# Patient Record
Sex: Male | Born: 1960 | Race: White | Hispanic: No | Smoking: Current every day smoker
Health system: Southern US, Community
[De-identification: ages and names within clinical notes are randomized; demographics above are authoritative.]

## PROBLEM LIST (undated history)

## (undated) ENCOUNTER — Ambulatory Visit

## (undated) ENCOUNTER — Encounter

## (undated) ENCOUNTER — Encounter: Attending: Surgery | Primary: Surgery

## (undated) ENCOUNTER — Ambulatory Visit
Attending: Pharmacist Clinician (PhC)/ Clinical Pharmacy Specialist | Primary: Pharmacist Clinician (PhC)/ Clinical Pharmacy Specialist

## (undated) ENCOUNTER — Encounter: Payer: MEDICAID | Attending: Family | Primary: Family

## (undated) ENCOUNTER — Ambulatory Visit: Attending: Internal Medicine | Primary: Internal Medicine

## (undated) ENCOUNTER — Telehealth

## (undated) ENCOUNTER — Telehealth: Attending: Surgery | Primary: Surgery

## (undated) ENCOUNTER — Telehealth: Attending: Family | Primary: Family

## (undated) ENCOUNTER — Ambulatory Visit: Payer: MEDICAID | Attending: Family | Primary: Family

## (undated) ENCOUNTER — Encounter
Attending: Student in an Organized Health Care Education/Training Program | Primary: Student in an Organized Health Care Education/Training Program

## (undated) ENCOUNTER — Encounter: Attending: Family | Primary: Family

## (undated) ENCOUNTER — Ambulatory Visit: Payer: Medicaid (Managed Care) | Attending: Surgery | Primary: Surgery

## (undated) ENCOUNTER — Ambulatory Visit: Payer: MEDICAID

## (undated) ENCOUNTER — Ambulatory Visit: Attending: Family Medicine | Primary: Family Medicine

## (undated) ENCOUNTER — Telehealth
Attending: Pharmacist Clinician (PhC)/ Clinical Pharmacy Specialist | Primary: Pharmacist Clinician (PhC)/ Clinical Pharmacy Specialist

## (undated) ENCOUNTER — Encounter
Attending: Pharmacist Clinician (PhC)/ Clinical Pharmacy Specialist | Primary: Pharmacist Clinician (PhC)/ Clinical Pharmacy Specialist

## (undated) ENCOUNTER — Telehealth: Attending: Radiation Oncology | Primary: Radiation Oncology

## (undated) ENCOUNTER — Telehealth: Attending: Family Medicine | Primary: Family Medicine

## (undated) ENCOUNTER — Encounter: Attending: Hematology & Oncology | Primary: Hematology & Oncology

## (undated) ENCOUNTER — Ambulatory Visit: Payer: MEDICAID | Attending: Clinical | Primary: Clinical

## (undated) DIAGNOSIS — I2510 Atherosclerotic heart disease of native coronary artery without angina pectoris: Secondary | ICD-10-CM

## (undated) DIAGNOSIS — J449 Chronic obstructive pulmonary disease, unspecified: Secondary | ICD-10-CM

## (undated) DIAGNOSIS — C801 Malignant (primary) neoplasm, unspecified: Secondary | ICD-10-CM

## (undated) DIAGNOSIS — F329 Major depressive disorder, single episode, unspecified: Secondary | ICD-10-CM

## (undated) DIAGNOSIS — F32A Depression, unspecified: Secondary | ICD-10-CM

## (undated) DIAGNOSIS — B182 Chronic viral hepatitis C: Secondary | ICD-10-CM

## (undated) DIAGNOSIS — F319 Bipolar disorder, unspecified: Secondary | ICD-10-CM

## (undated) DIAGNOSIS — K2270 Barrett's esophagus without dysplasia: Secondary | ICD-10-CM

## (undated) DIAGNOSIS — F419 Anxiety disorder, unspecified: Secondary | ICD-10-CM

## (undated) DIAGNOSIS — I639 Cerebral infarction, unspecified: Secondary | ICD-10-CM

## (undated) DIAGNOSIS — D72829 Elevated white blood cell count, unspecified: Secondary | ICD-10-CM

## (undated) DIAGNOSIS — K219 Gastro-esophageal reflux disease without esophagitis: Secondary | ICD-10-CM

## (undated) DIAGNOSIS — E785 Hyperlipidemia, unspecified: Secondary | ICD-10-CM

## (undated) DIAGNOSIS — K759 Inflammatory liver disease, unspecified: Secondary | ICD-10-CM

## (undated) DIAGNOSIS — E119 Type 2 diabetes mellitus without complications: Secondary | ICD-10-CM

## (undated) DIAGNOSIS — R7303 Prediabetes: Secondary | ICD-10-CM

## (undated) DIAGNOSIS — I10 Essential (primary) hypertension: Secondary | ICD-10-CM

## (undated) HISTORY — PX: CORONARY ANGIOPLASTY: SHX604

## (undated) HISTORY — PX: WRIST SURGERY: SHX841

## (undated) HISTORY — PX: ANGIOPLASTY: SHX39

## (undated) HISTORY — PX: CARDIAC CATHETERIZATION: SHX172

## (undated) HISTORY — PX: APPENDECTOMY: SHX54

## (undated) HISTORY — PX: CHOLECYSTECTOMY: SHX55

---

## 2003-06-25 ENCOUNTER — Encounter: Payer: Self-pay | Admitting: Emergency Medicine

## 2003-06-25 ENCOUNTER — Emergency Department (HOSPITAL_COMMUNITY): Admission: EM | Admit: 2003-06-25 | Discharge: 2003-06-25 | Payer: Self-pay | Admitting: Emergency Medicine

## 2007-05-16 ENCOUNTER — Other Ambulatory Visit: Payer: Self-pay

## 2007-05-16 ENCOUNTER — Emergency Department: Payer: Self-pay | Admitting: Unknown Physician Specialty

## 2009-11-07 ENCOUNTER — Emergency Department: Payer: Self-pay | Admitting: Emergency Medicine

## 2010-03-04 ENCOUNTER — Emergency Department: Payer: Self-pay | Admitting: Emergency Medicine

## 2010-11-26 ENCOUNTER — Observation Stay: Payer: Self-pay | Admitting: Internal Medicine

## 2011-04-29 ENCOUNTER — Emergency Department: Payer: Self-pay | Admitting: Emergency Medicine

## 2011-06-10 ENCOUNTER — Emergency Department: Payer: Self-pay | Admitting: Emergency Medicine

## 2011-07-24 ENCOUNTER — Inpatient Hospital Stay: Payer: Self-pay | Admitting: Surgery

## 2011-09-23 IMAGING — CR DG CHEST 2V
1 series · 2 of 2 positions shown · non-contrast
Comparison: none

REASON FOR EXAM: chest pain
COMMENTS:

PROCEDURE:     DXR - DXR CHEST PA (OR AP) AND LATERAL  - November 26, 2010  [DATE]
RESULT:     The lung fields are clear. The heart, mediastinal and osseous
structures reveal no significant abnormalities.

[Series 1: view not recorded · 0.17mm/px · 2 of 2 slices shown]
[im 1/2]
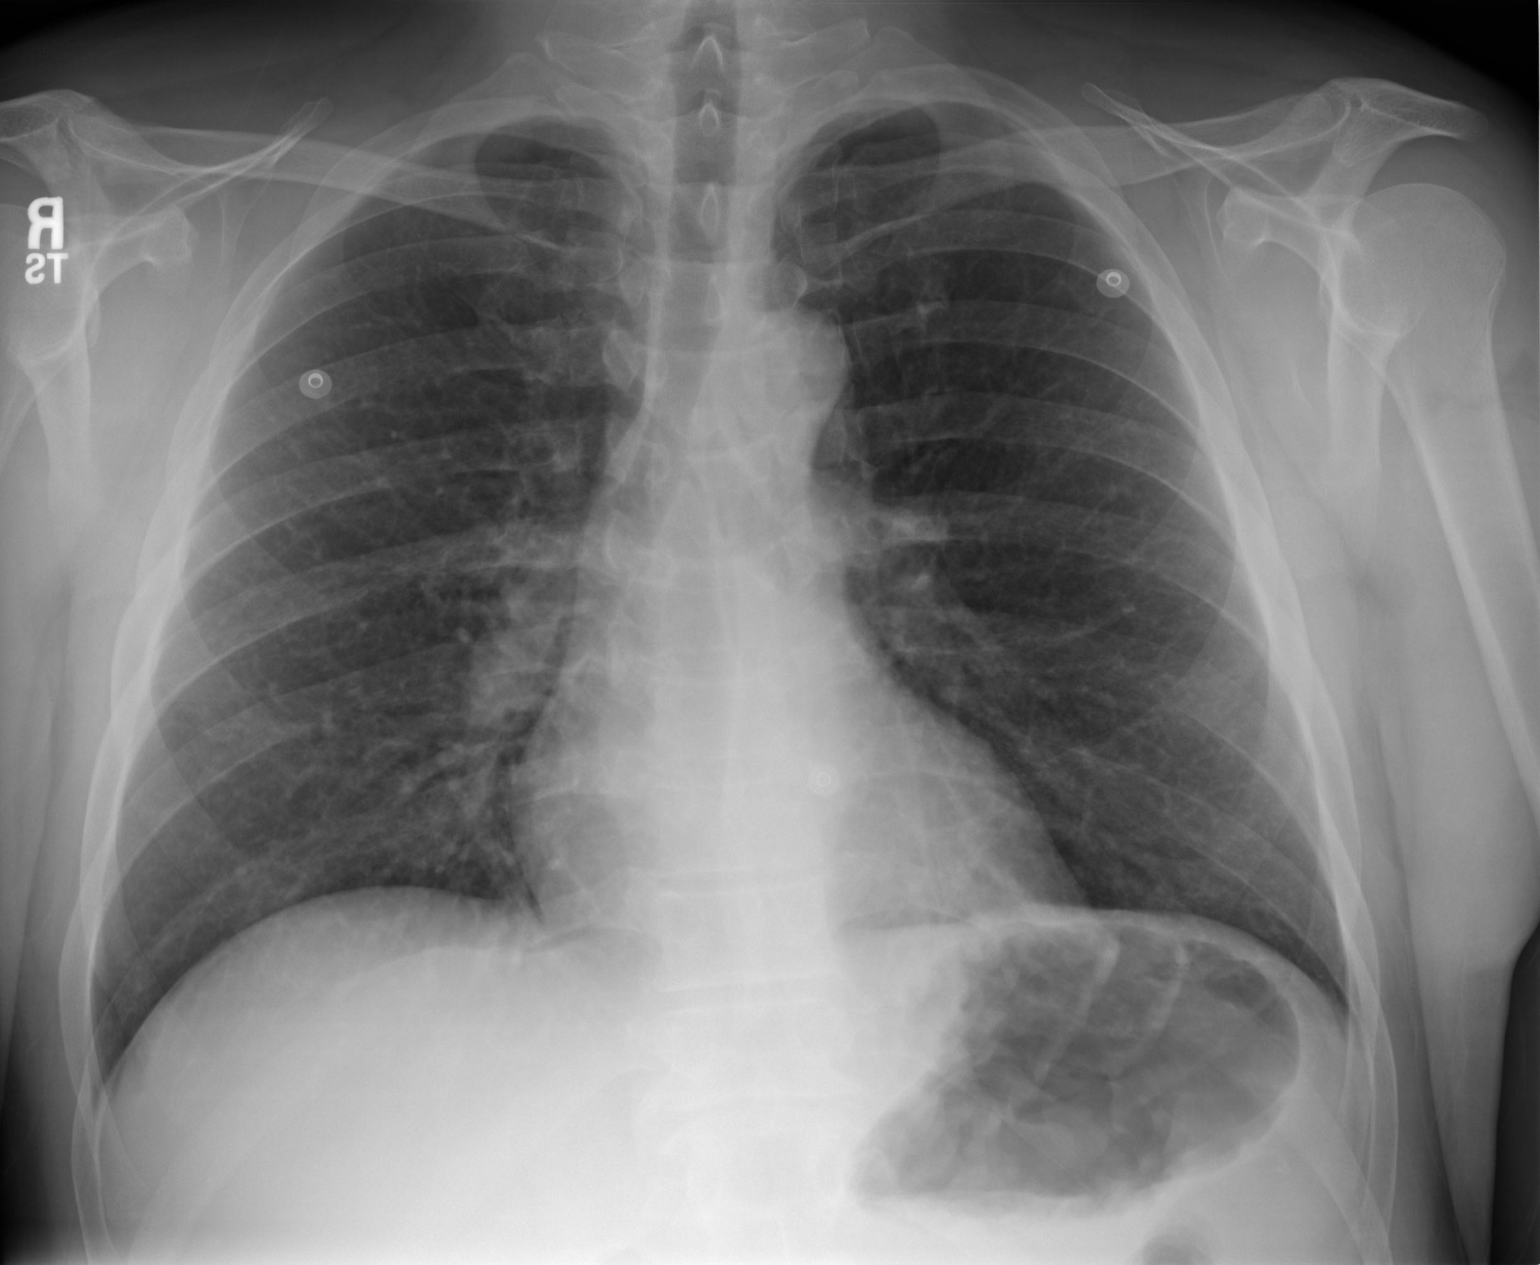
[im 2/2]
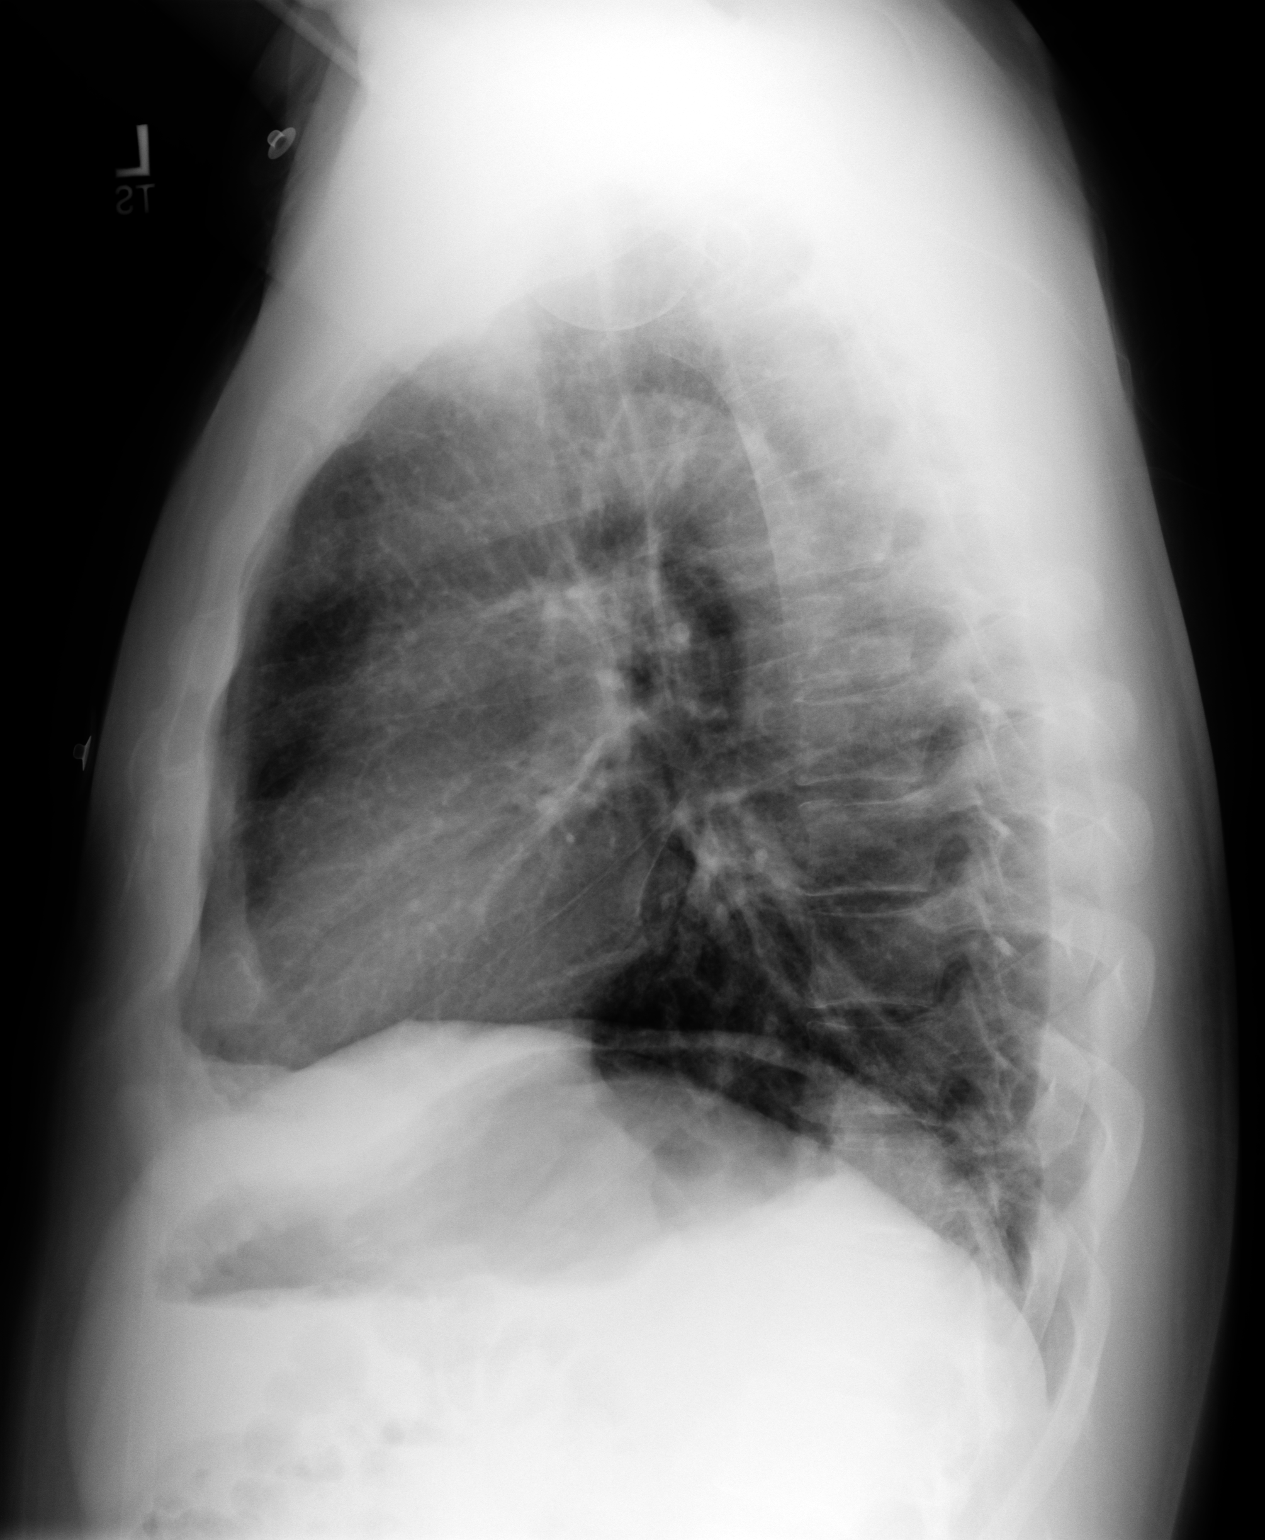

[2 of 2 positions shown; findings below may reference images not displayed]

IMPRESSION: 1.     No significant abnormalities are noted.

## 2011-09-24 ENCOUNTER — Inpatient Hospital Stay: Payer: Self-pay | Admitting: Internal Medicine

## 2011-10-01 LAB — PATHOLOGY REPORT

## 2012-02-04 ENCOUNTER — Inpatient Hospital Stay: Payer: Self-pay | Admitting: Internal Medicine

## 2012-02-04 LAB — CBC
HCT: 50.8 % (ref 40.0–52.0)
MCH: 31.2 pg (ref 26.0–34.0)
MCHC: 33.3 g/dL (ref 32.0–36.0)
RBC: 5.43 10*6/uL (ref 4.40–5.90)
RDW: 13.1 % (ref 11.5–14.5)

## 2012-02-04 LAB — COMPREHENSIVE METABOLIC PANEL
Albumin: 3.6 g/dL (ref 3.4–5.0)
Anion Gap: 7 (ref 7–16)
Calcium, Total: 9.1 mg/dL (ref 8.5–10.1)
Chloride: 106 mmol/L (ref 98–107)
EGFR (African American): >60
EGFR (Non-African Amer.): >60
Glucose: 96 mg/dL (ref 65–99)
Potassium: 4.6 mmol/L (ref 3.5–5.1)
SGOT(AST): 52 U/L — ABNORMAL HIGH (ref 15–37)
SGPT (ALT): 81 U/L — ABNORMAL HIGH
Sodium: 139 mmol/L (ref 136–145)

## 2012-02-04 LAB — PROTIME-INR: INR: 0.9

## 2012-02-04 LAB — CK TOTAL AND CKMB (NOT AT ARMC): CK-MB: 1.4 ng/mL (ref 0.5–3.6)

## 2012-02-05 LAB — HEPATIC FUNCTION PANEL A (ARMC)
Albumin: 3.6 g/dL (ref 3.4–5.0)
Alkaline Phosphatase: 61 U/L (ref 50–136)
SGOT(AST): 54 U/L — ABNORMAL HIGH (ref 15–37)
SGPT (ALT): 86 U/L — ABNORMAL HIGH

## 2012-02-05 LAB — TROPONIN I
Troponin-I: <0.02 ng/mL
Troponin-I: <0.02 ng/mL

## 2012-02-19 ENCOUNTER — Observation Stay: Payer: Self-pay | Admitting: Internal Medicine

## 2012-02-19 LAB — COMPREHENSIVE METABOLIC PANEL
Alkaline Phosphatase: 72 U/L (ref 50–136)
Anion Gap: 17 — ABNORMAL HIGH (ref 7–16)
BUN: 13 mg/dL (ref 7–18)
Bilirubin,Total: 0.4 mg/dL (ref 0.2–1.0)
Chloride: 107 mmol/L (ref 98–107)
Co2: 21 mmol/L (ref 21–32)
Creatinine: 0.86 mg/dL (ref 0.60–1.30)
EGFR (Non-African Amer.): >60
Glucose: 103 mg/dL — ABNORMAL HIGH (ref 65–99)
Osmolality: 289 (ref 275–301)
Potassium: 4.1 mmol/L (ref 3.5–5.1)
Sodium: 145 mmol/L (ref 136–145)

## 2012-02-19 LAB — CK TOTAL AND CKMB (NOT AT ARMC): CK-MB: 2.6 ng/mL (ref 0.5–3.6)

## 2012-02-19 LAB — CBC WITH DIFFERENTIAL/PLATELET
Basophil #: 0 10*3/uL (ref 0.0–0.1)
Eosinophil #: 0.4 10*3/uL (ref 0.0–0.7)
HCT: 50.7 % (ref 40.0–52.0)
Lymphocyte #: 5.5 10*3/uL — ABNORMAL HIGH (ref 1.0–3.6)
MCHC: 34 g/dL (ref 32.0–36.0)
MCV: 93 fL (ref 80–100)
Monocyte #: 1.1 10*3/uL — ABNORMAL HIGH (ref 0.0–0.7)
Monocyte %: 7.5 %
Neutrophil #: 7.6 10*3/uL — ABNORMAL HIGH (ref 1.4–6.5)
Neutrophil %: 51.8 %
RDW: 12.7 % (ref 11.5–14.5)
WBC: 14.6 10*3/uL — ABNORMAL HIGH (ref 3.8–10.6)

## 2012-02-19 LAB — TROPONIN I
Troponin-I: <0.02 ng/mL
Troponin-I: <0.02 ng/mL

## 2012-02-19 LAB — ETHANOL: Ethanol: 149 mg/dL

## 2012-02-19 LAB — URINALYSIS, COMPLETE
Bacteria: NONE SEEN
Blood: NEGATIVE
Hyaline Cast: 3
Ketone: NEGATIVE
Protein: NEGATIVE
RBC,UR: <1 /HPF (ref 0–5)
Squamous Epithelial: NONE SEEN

## 2012-02-19 LAB — PROTIME-INR
INR: 0.9
Prothrombin Time: 13 secs (ref 11.5–14.7)

## 2012-02-19 LAB — CK-MB: CK-MB: 1.6 ng/mL (ref 0.5–3.6)

## 2012-02-20 DIAGNOSIS — R9431 Abnormal electrocardiogram [ECG] [EKG]: Secondary | ICD-10-CM

## 2012-02-20 LAB — COMPREHENSIVE METABOLIC PANEL
Albumin: 3.3 g/dL — ABNORMAL LOW (ref 3.4–5.0)
Anion Gap: 7 (ref 7–16)
BUN: 18 mg/dL (ref 7–18)
Bilirubin,Total: 0.6 mg/dL (ref 0.2–1.0)
Creatinine: 0.93 mg/dL (ref 0.60–1.30)
EGFR (African American): >60
Glucose: 110 mg/dL — ABNORMAL HIGH (ref 65–99)
Potassium: 4.1 mmol/L (ref 3.5–5.1)
SGPT (ALT): 91 U/L — ABNORMAL HIGH
Sodium: 139 mmol/L (ref 136–145)
Total Protein: 6.3 g/dL — ABNORMAL LOW (ref 6.4–8.2)

## 2012-02-20 LAB — CBC WITH DIFFERENTIAL/PLATELET
Basophil %: 0.5 %
Eosinophil %: 6.9 %
HCT: 45.3 % (ref 40.0–52.0)
HGB: 15.5 g/dL (ref 13.0–18.0)
Lymphocyte #: 3.9 10*3/uL — ABNORMAL HIGH (ref 1.0–3.6)
Monocyte #: 0.6 10*3/uL (ref 0.0–0.7)
Monocyte %: 7.1 %
Neutrophil #: 3.9 10*3/uL (ref 1.4–6.5)
RBC: 4.94 10*6/uL (ref 4.40–5.90)

## 2012-02-20 LAB — TROPONIN I: Troponin-I: <0.02 ng/mL

## 2012-04-06 IMAGING — CR DG CHEST 1V PORT
1 series · 1 of 1 positions shown · non-contrast
Comparison: none

REASON FOR EXAM: upper abd pain
COMMENTS:

PROCEDURE:     DXR - DXR PORTABLE CHEST SINGLE VIEW  - June 10, 2011  [DATE]
RESULT:     Comparison is made to the prior exam of 11/26/2010.
The lung fields are clear. The heart, mediastinal and osseous structures
show no significant abnormalities.

[view not recorded]
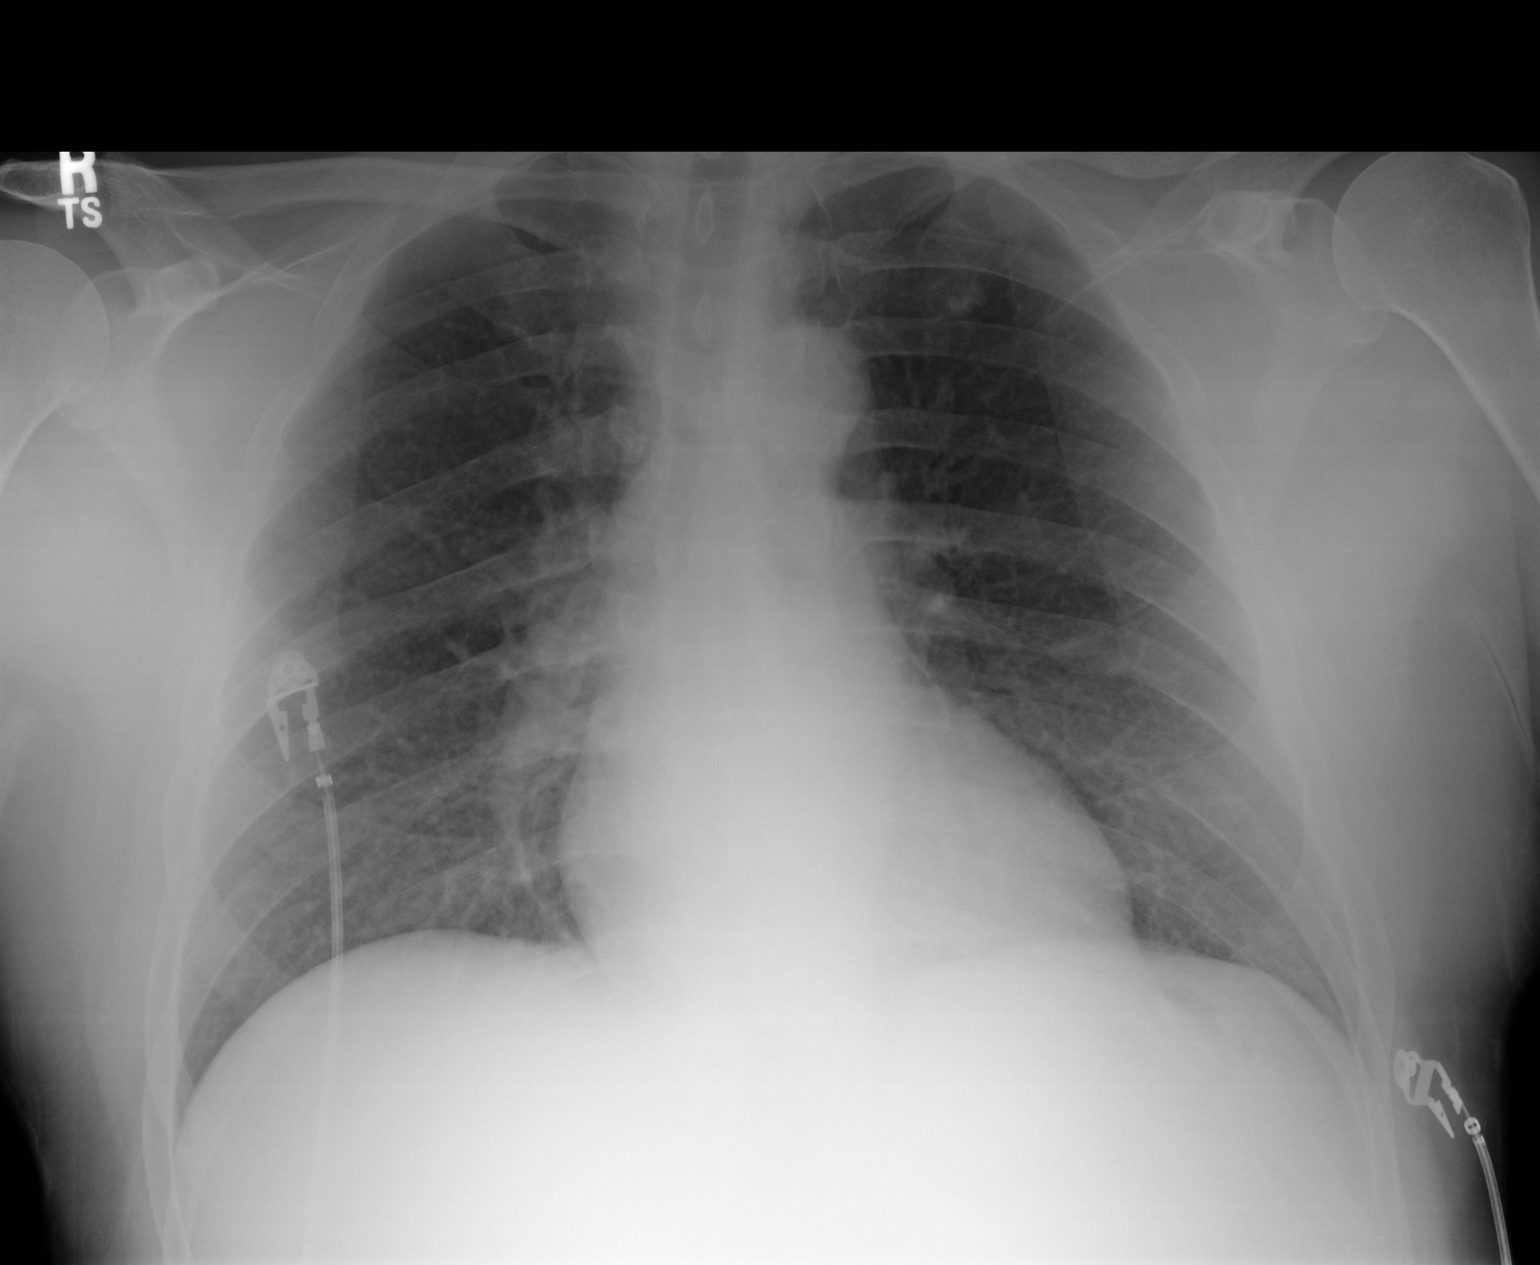

[1 of 1 positions shown; findings below may reference images not displayed]

IMPRESSION: No acute changes are identified.

## 2012-04-06 IMAGING — US ABDOMEN ULTRASOUND
1 series · 17 of 25 positions shown · non-contrast
Comparison: none

REASON FOR EXAM: RUQ pain
COMMENTS:   May transport without cardiac monitor

PROCEDURE:     US  - US ABDOMEN GENERAL SURVEY  - June 10, 2011 [DATE]
RESULT:     Comparison: None
TECHNIQUE: Multiple gray-scale and color-flow Doppler images of the abdomen
are presented for review.

[Series 1: abdomen ultrasound · 17 of 119 slices shown]
[im 1/119]
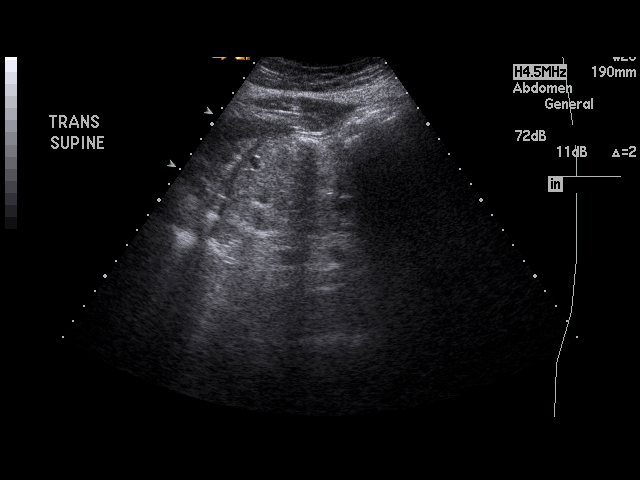
[im 10/119]
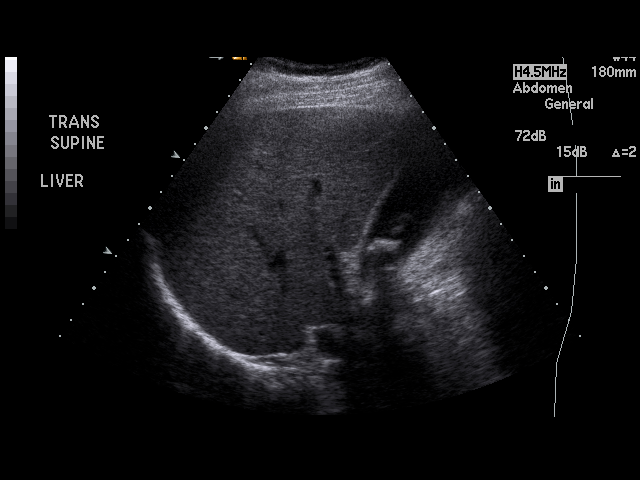
[im 15/119]
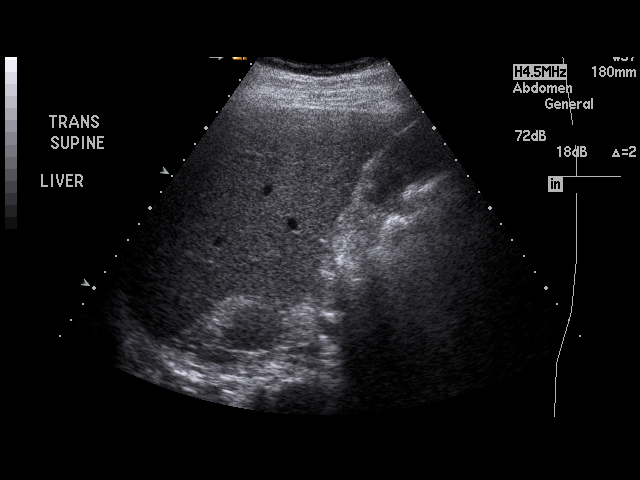
[im 25/119]
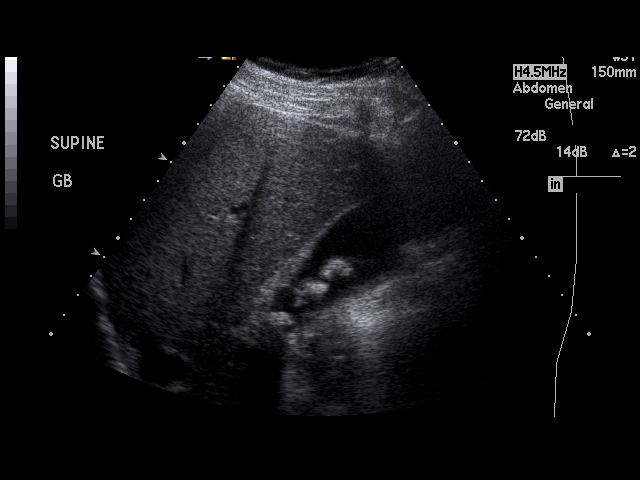
[im 30/119]
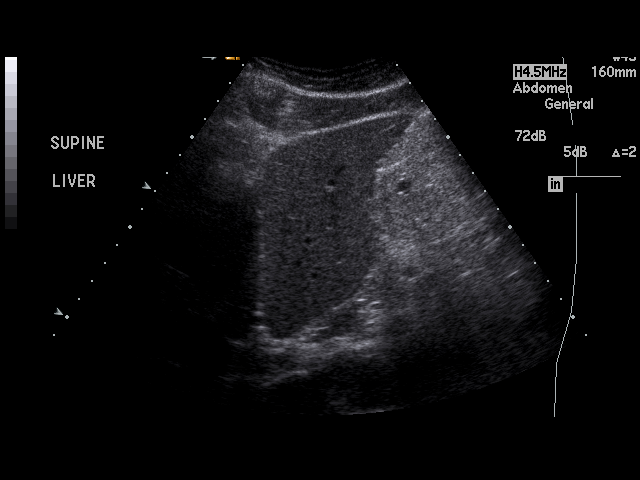
[im 40/119]
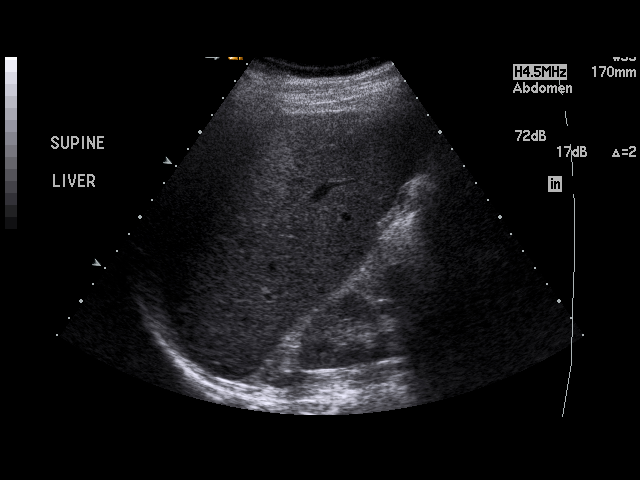
[im 45/119]
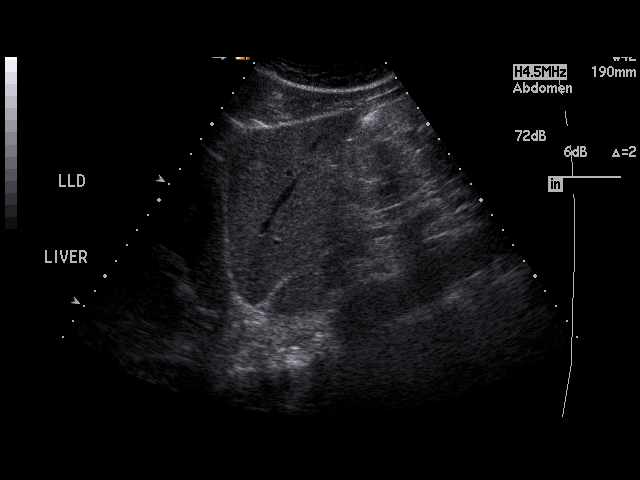
[im 55/119]
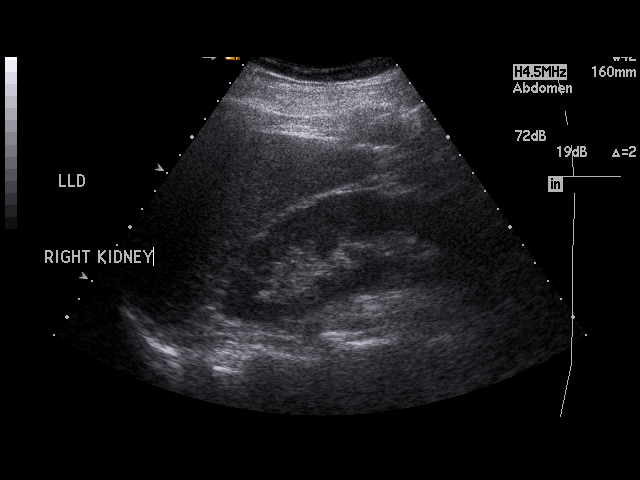
[im 60/119]
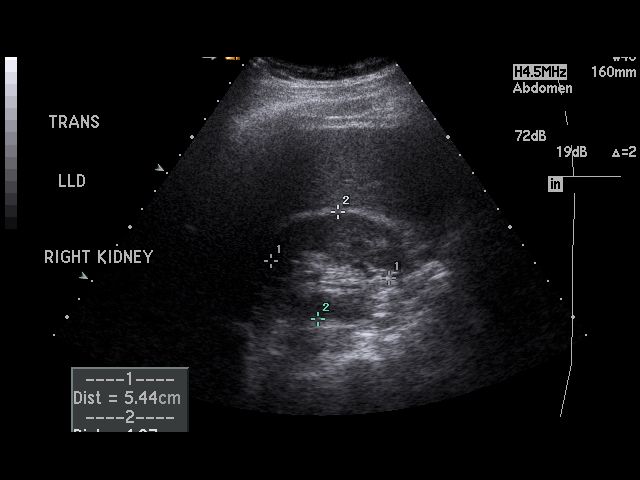
[im 64/119]
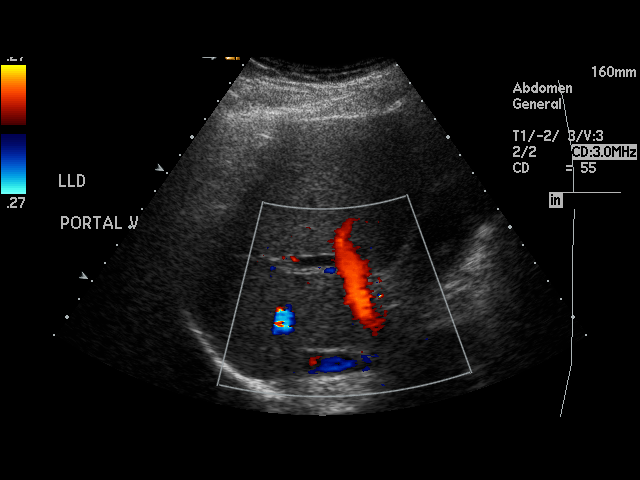
[im 74/119]
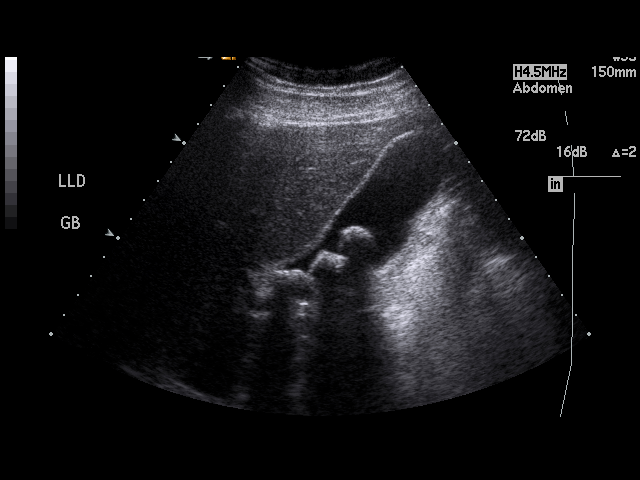
[im 79/119]
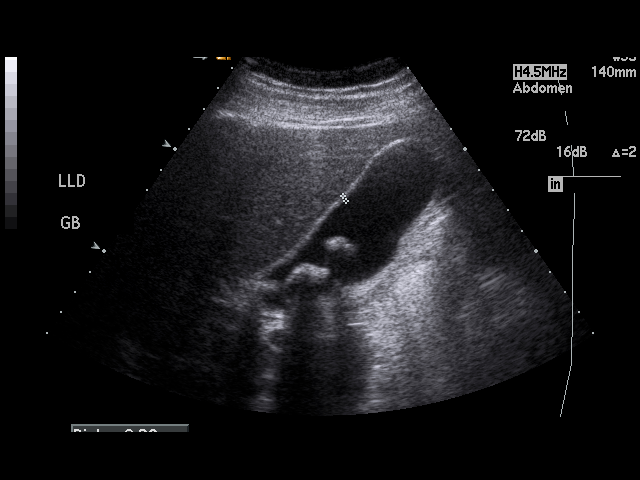
[im 89/119]
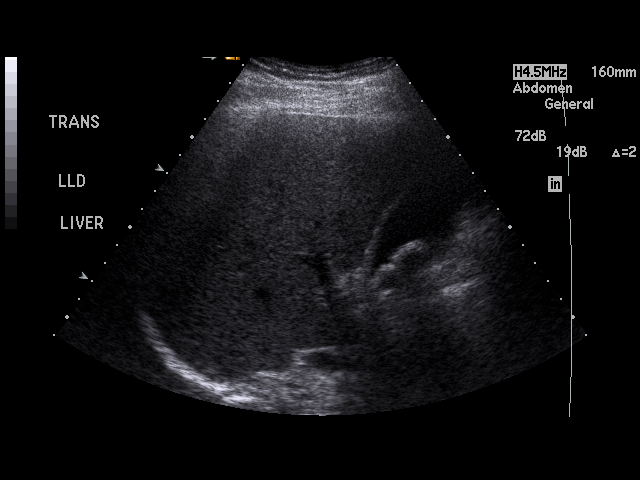
[im 94/119]
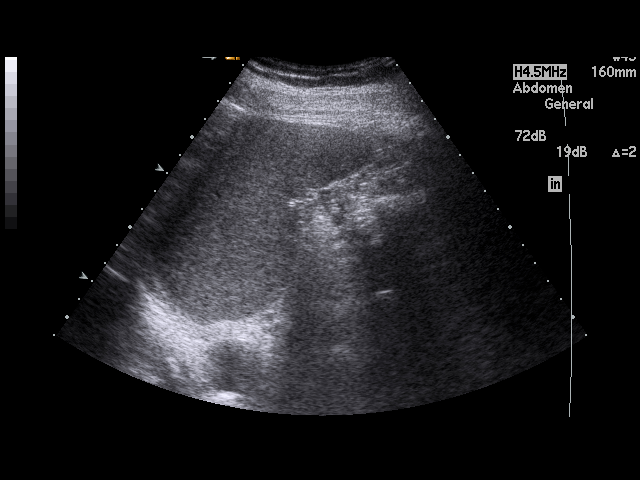
[im 104/119]
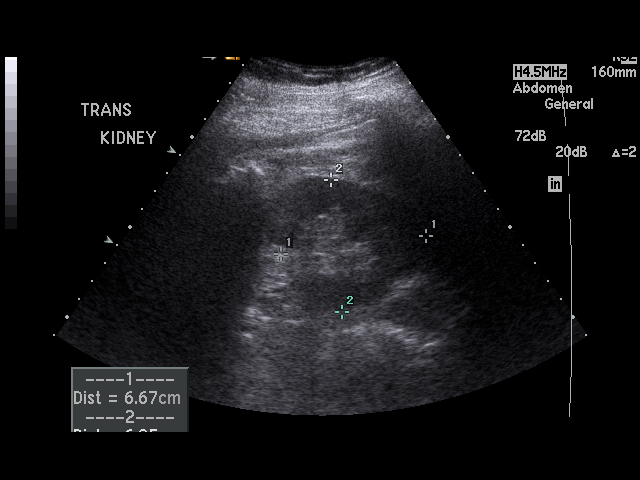
[im 109/119]
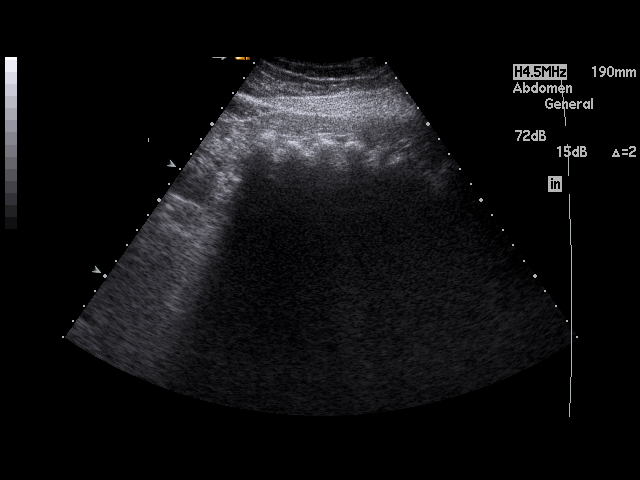
[im 119/119]
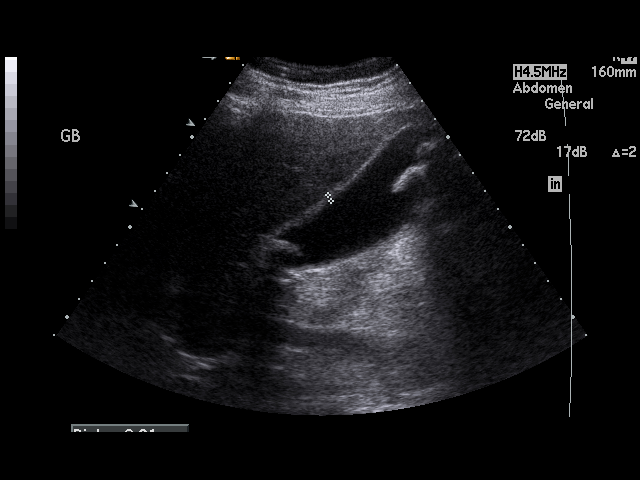

[17 of 25 positions shown; findings below may reference images not displayed]

FINDINGS: Visualized portions of the liver demonstrate normal echogenicity and normal
contours. The liver is without evidence of a focal hepatic lesion.

There are cholelithiasis with a gallstone impacted in the gallbladder neck.
There is no intra- or extrahepatic biliary ductal dilatation. The common
duct measures 4.6 mm in maximal diameter. There is a positive sonographic
Murphy sign. There is no pericholecystic fluid. The gallbladder wall is top
normal in thickness measuring 3.1 mm.

The pancreas is not visualized. The spleen is unremarkable. Bilateral
kidneys are normal in echogenicity and size. The right kidney measures
x 4.7 x 5.4 cm. The left kidney measures 12.9 x 6.4 x 6.7 cm. There are no
renal calculi or hydronephrosis. The abdominal aorta and IVC are
unremarkable.
IMPRESSION: Cholelithiasis with an impacted gallstone in the gallbladder neck. There are
findings concerning for, but not diagnostic of acute cholecystitis.

## 2012-04-19 ENCOUNTER — Observation Stay: Payer: Self-pay | Admitting: Specialist

## 2012-04-19 LAB — CK TOTAL AND CKMB (NOT AT ARMC)
CK, Total: 31 U/L — ABNORMAL LOW (ref 35–232)
CK, Total: 31 U/L — ABNORMAL LOW (ref 35–232)
CK-MB: <0.5 ng/mL — ABNORMAL LOW (ref 0.5–3.6)

## 2012-04-19 LAB — COMPREHENSIVE METABOLIC PANEL
Alkaline Phosphatase: 74 U/L (ref 50–136)
Anion Gap: 4 — ABNORMAL LOW (ref 7–16)
BUN: 14 mg/dL (ref 7–18)
Creatinine: 0.92 mg/dL (ref 0.60–1.30)
EGFR (African American): >60
EGFR (Non-African Amer.): >60
Glucose: 87 mg/dL (ref 65–99)
Osmolality: 276 (ref 275–301)
Potassium: 4.2 mmol/L (ref 3.5–5.1)
SGPT (ALT): 61 U/L

## 2012-04-19 LAB — CBC
HGB: 17.5 g/dL (ref 13.0–18.0)
MCHC: 33.3 g/dL (ref 32.0–36.0)
MCV: 94 fL (ref 80–100)

## 2012-04-19 LAB — ETHANOL: Ethanol: <3 mg/dL

## 2012-04-20 LAB — BASIC METABOLIC PANEL
Calcium, Total: 8.5 mg/dL (ref 8.5–10.1)
Co2: 27 mmol/L (ref 21–32)
Creatinine: 0.77 mg/dL (ref 0.60–1.30)
EGFR (African American): >60
EGFR (Non-African Amer.): >60
Glucose: 97 mg/dL (ref 65–99)
Osmolality: 276 (ref 275–301)
Potassium: 4 mmol/L (ref 3.5–5.1)
Sodium: 138 mmol/L (ref 136–145)

## 2012-04-20 LAB — CBC WITH DIFFERENTIAL/PLATELET
Basophil %: 0.5 %
Eosinophil #: 0.4 10*3/uL (ref 0.0–0.7)
Eosinophil %: 4.9 %
HCT: 49.7 % (ref 40.0–52.0)
HGB: 16.7 g/dL (ref 13.0–18.0)
Lymphocyte #: 2.8 10*3/uL (ref 1.0–3.6)
Lymphocyte %: 33.3 %
MCH: 31.4 pg (ref 26.0–34.0)
Monocyte #: 0.8 x10 3/mm (ref 0.2–1.0)
Monocyte %: 9.8 %
Neutrophil #: 4.4 10*3/uL (ref 1.4–6.5)
Platelet: 197 10*3/uL (ref 150–440)
RBC: 5.33 10*6/uL (ref 4.40–5.90)
RDW: 13.1 % (ref 11.5–14.5)
WBC: 8.5 10*3/uL (ref 3.8–10.6)

## 2012-05-04 ENCOUNTER — Emergency Department: Payer: Self-pay | Admitting: Emergency Medicine

## 2012-05-04 LAB — CBC WITH DIFFERENTIAL/PLATELET
Basophil #: 0.3 10*3/uL — ABNORMAL HIGH (ref 0.0–0.1)
Eosinophil #: 0.3 10*3/uL (ref 0.0–0.7)
HCT: 51.1 % (ref 40.0–52.0)
HGB: 17.3 g/dL (ref 13.0–18.0)
Lymphocyte %: 40.3 %
MCH: 31.2 pg (ref 26.0–34.0)
MCHC: 33.8 g/dL (ref 32.0–36.0)
MCV: 92 fL (ref 80–100)
Monocyte %: 4.1 %
Platelet: 181 10*3/uL (ref 150–440)
RDW: 12.8 % (ref 11.5–14.5)
WBC: 11.9 10*3/uL — ABNORMAL HIGH (ref 3.8–10.6)

## 2012-05-04 LAB — ETHANOL
Ethanol %: 0.153 % — ABNORMAL HIGH
Ethanol: 153 mg/dL

## 2012-05-04 LAB — COMPREHENSIVE METABOLIC PANEL
Alkaline Phosphatase: 67 U/L (ref 50–136)
BUN: 9 mg/dL (ref 7–18)
Calcium, Total: 8.3 mg/dL — ABNORMAL LOW (ref 8.5–10.1)
Chloride: 107 mmol/L (ref 98–107)
EGFR (Non-African Amer.): >60
Osmolality: 277 (ref 275–301)
Potassium: 4.1 mmol/L (ref 3.5–5.1)
SGOT(AST): 42 U/L — ABNORMAL HIGH (ref 15–37)
SGPT (ALT): 50 U/L
Sodium: 140 mmol/L (ref 136–145)

## 2012-05-06 DIAGNOSIS — C801 Malignant (primary) neoplasm, unspecified: Secondary | ICD-10-CM | POA: Insufficient documentation

## 2012-05-11 ENCOUNTER — Inpatient Hospital Stay: Payer: Self-pay | Admitting: Psychiatry

## 2012-05-11 LAB — CBC
HGB: 17.7 g/dL (ref 13.0–18.0)
MCH: 31.2 pg (ref 26.0–34.0)
MCHC: 33.6 g/dL (ref 32.0–36.0)
Platelet: 187 10*3/uL (ref 150–440)
RDW: 13 % (ref 11.5–14.5)
WBC: 10.9 10*3/uL — ABNORMAL HIGH (ref 3.8–10.6)

## 2012-05-11 LAB — COMPREHENSIVE METABOLIC PANEL
Albumin: 4.1 g/dL (ref 3.4–5.0)
Alkaline Phosphatase: 77 U/L (ref 50–136)
Anion Gap: 9 (ref 7–16)
Bilirubin,Total: 0.6 mg/dL (ref 0.2–1.0)
Calcium, Total: 8.7 mg/dL (ref 8.5–10.1)
Chloride: 106 mmol/L (ref 98–107)
Co2: 22 mmol/L (ref 21–32)
Creatinine: 1.13 mg/dL (ref 0.60–1.30)
EGFR (African American): >60
Osmolality: 277 (ref 275–301)
Potassium: 3.7 mmol/L (ref 3.5–5.1)
SGOT(AST): 34 U/L (ref 15–37)
SGPT (ALT): 47 U/L
Total Protein: 7.9 g/dL (ref 6.4–8.2)

## 2012-05-11 LAB — SALICYLATE LEVEL: Salicylates, Serum: 4.2 mg/dL — ABNORMAL HIGH

## 2012-05-11 LAB — DRUG SCREEN, URINE
Amphetamines, Ur Screen: NEGATIVE (ref ?–1000)
Barbiturates, Ur Screen: NEGATIVE (ref ?–200)
Benzodiazepine, Ur Scrn: NEGATIVE (ref ?–200)
Cannabinoid 50 Ng, Ur ~~LOC~~: NEGATIVE (ref ?–50)
MDMA (Ecstasy)Ur Screen: NEGATIVE (ref ?–500)
Methadone, Ur Screen: NEGATIVE (ref ?–300)
Phencyclidine (PCP) Ur S: NEGATIVE (ref ?–25)

## 2012-05-11 LAB — LIPASE, BLOOD: Lipase: 83 U/L (ref 73–393)

## 2012-05-11 LAB — ETHANOL: Ethanol %: <0.003 % (ref 0.000–0.080)

## 2012-05-11 LAB — TSH: Thyroid Stimulating Horm: 1.01 u[IU]/mL

## 2012-05-12 LAB — BEHAVIORAL MEDICINE 1 PANEL
Albumin: 3.4 g/dL (ref 3.4–5.0)
Alkaline Phosphatase: 66 U/L (ref 50–136)
Anion Gap: 6 — ABNORMAL LOW (ref 7–16)
BUN: 21 mg/dL — ABNORMAL HIGH (ref 7–18)
Basophil #: 0.1 10*3/uL (ref 0.0–0.1)
Basophil %: 0.6 %
Bilirubin,Total: 0.6 mg/dL (ref 0.2–1.0)
Calcium, Total: 8.9 mg/dL (ref 8.5–10.1)
Chloride: 103 mmol/L (ref 98–107)
Co2: 30 mmol/L (ref 21–32)
Creatinine: 1.16 mg/dL (ref 0.60–1.30)
EGFR (African American): >60
Eosinophil %: 7.1 %
Glucose: 89 mg/dL (ref 65–99)
HCT: 49.5 % (ref 40.0–52.0)
HGB: 16.4 g/dL (ref 13.0–18.0)
Lymphocyte #: 3.6 10*3/uL (ref 1.0–3.6)
MCHC: 33.1 g/dL (ref 32.0–36.0)
MCV: 93 fL (ref 80–100)
Monocyte #: 1 x10 3/mm (ref 0.2–1.0)
Neutrophil #: 4.4 10*3/uL (ref 1.4–6.5)
Platelet: 187 10*3/uL (ref 150–440)
Potassium: 3.8 mmol/L (ref 3.5–5.1)
RBC: 5.32 10*6/uL (ref 4.40–5.90)
RDW: 13.1 % (ref 11.5–14.5)
SGOT(AST): 29 U/L (ref 15–37)
SGPT (ALT): 41 U/L
Sodium: 139 mmol/L (ref 136–145)
Thyroid Stimulating Horm: 1.11 u[IU]/mL
WBC: 9.7 10*3/uL (ref 3.8–10.6)

## 2012-05-12 LAB — URINALYSIS, COMPLETE
Bacteria: NONE SEEN
Glucose,UR: NEGATIVE mg/dL (ref 0–75)
Ketone: NEGATIVE
Leukocyte Esterase: NEGATIVE
Nitrite: NEGATIVE
Ph: 5 (ref 4.5–8.0)
Protein: NEGATIVE
Specific Gravity: 1.02 (ref 1.003–1.030)
WBC UR: 1 /HPF (ref 0–5)

## 2012-05-20 IMAGING — US ABDOMEN ULTRASOUND LIMITED
1 series · 17 of 23 positions shown · non-contrast
Comparison: none

REASON FOR EXAM: RUQ pain h/o cholelithiasis concerned for cholecystitis
COMMENTS:

[Series 1: abdomen ultrasound limited · 17 of 23 slices shown]
[im 1/23]
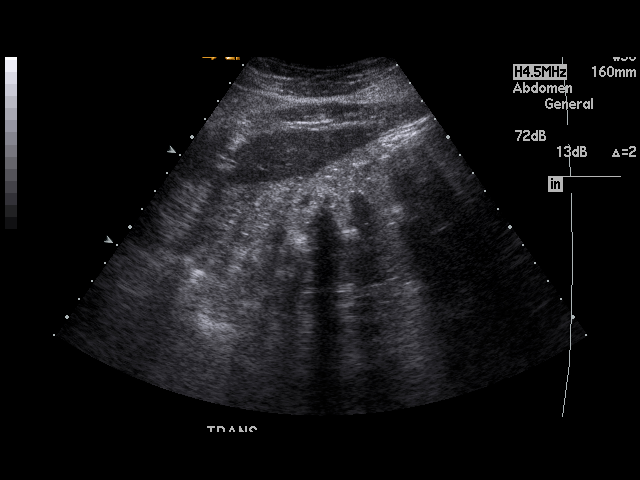
[im 3/23]
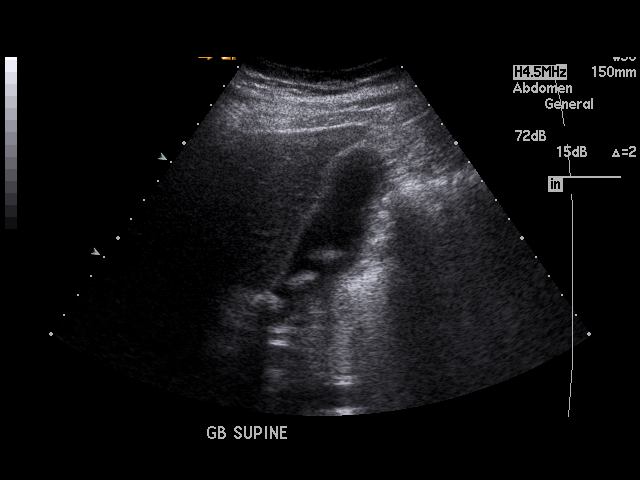
[im 4/23]
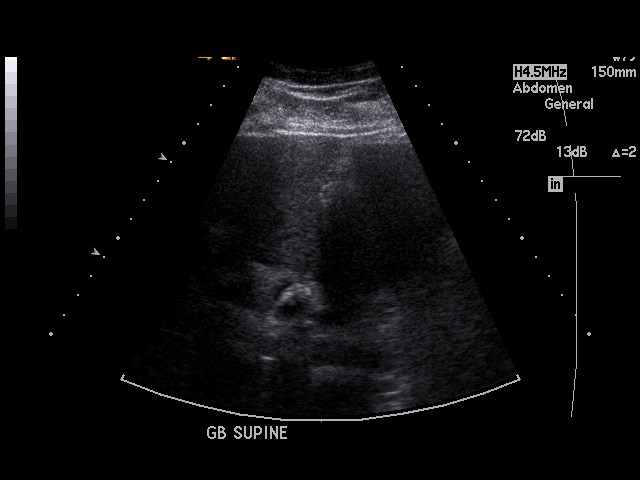
[im 5/23]
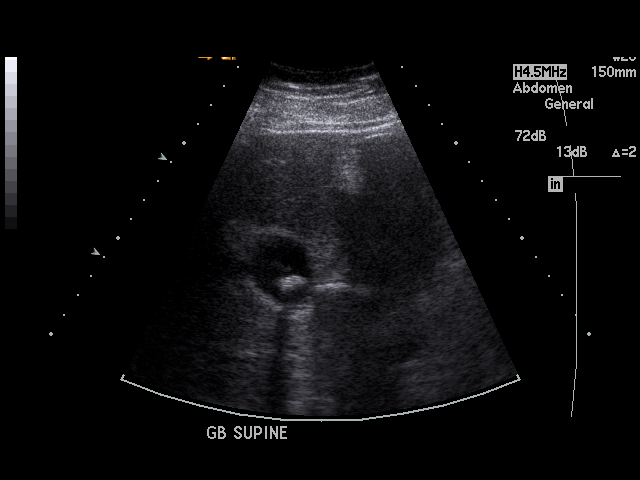
[im 7/23]
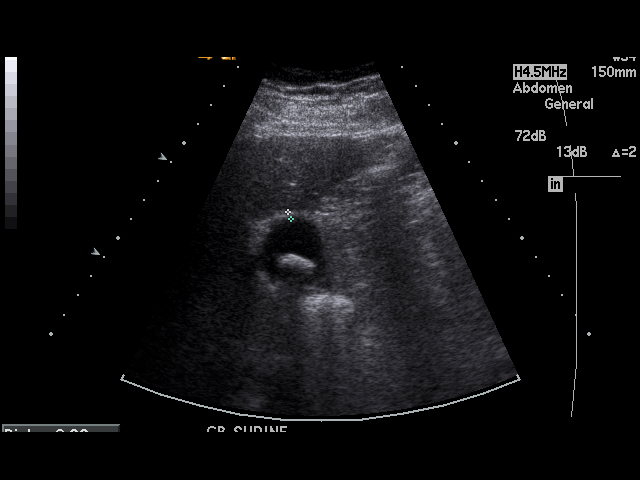
[im 8/23]
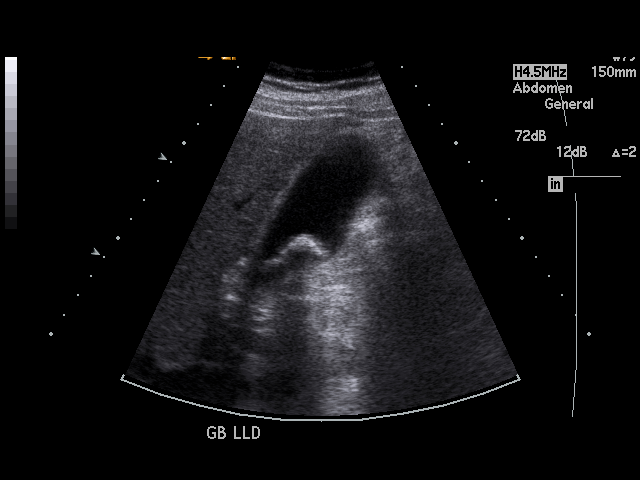
[im 9/23]
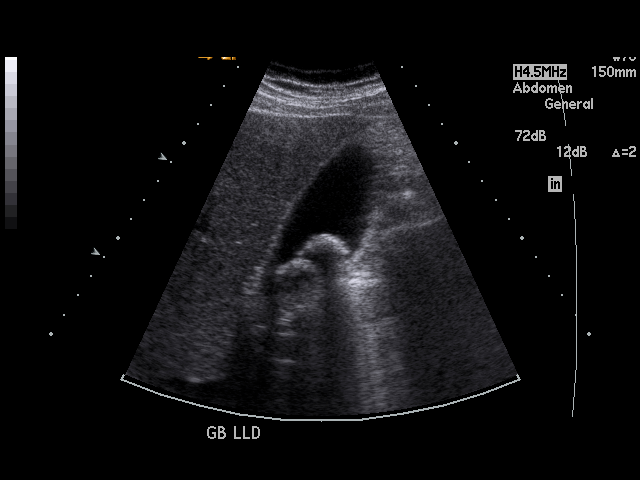
[im 11/23]
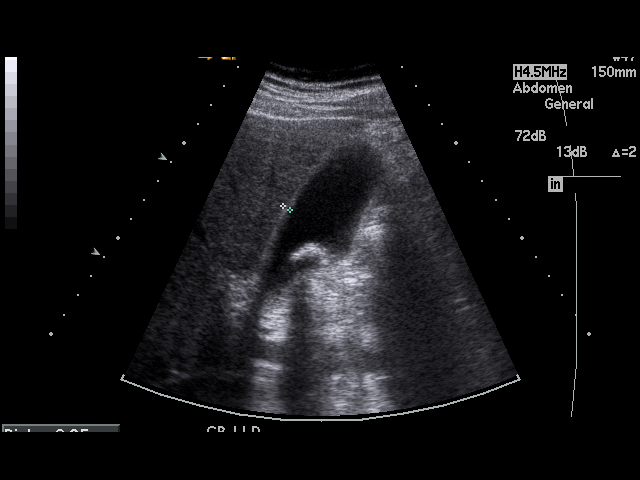
[im 12/23]
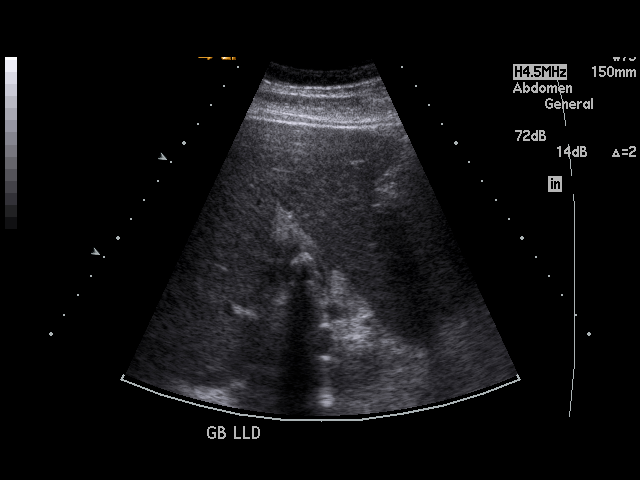
[im 13/23]
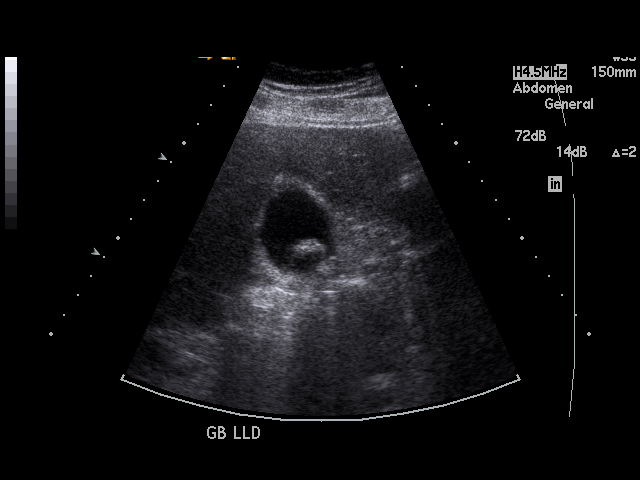
[im 15/23]
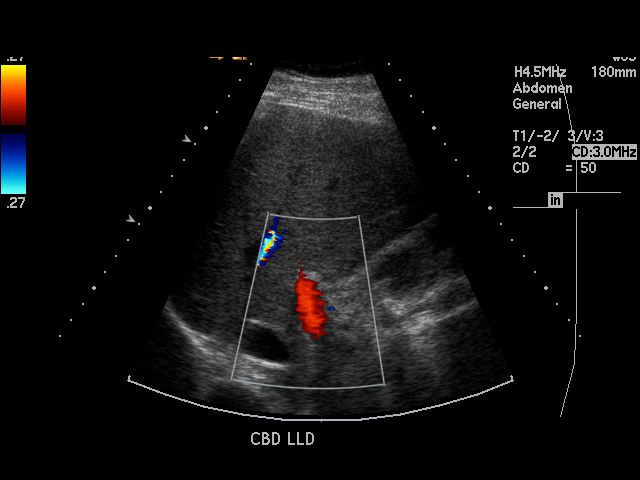
[im 16/23]
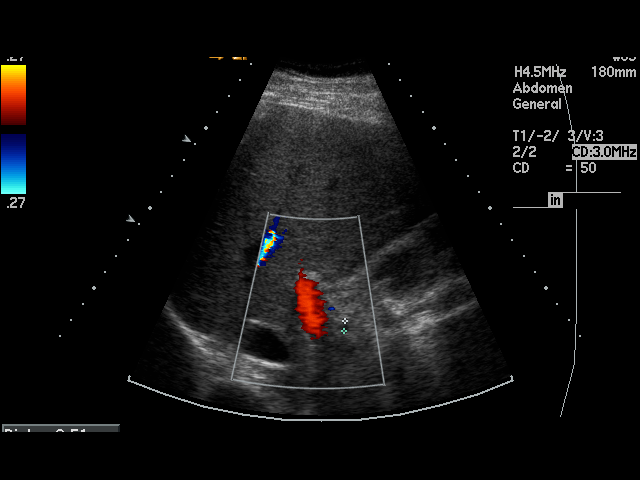
[im 17/23]
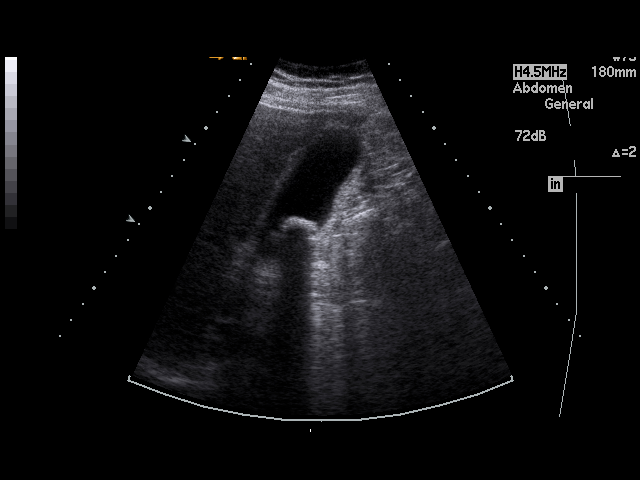
[im 19/23]
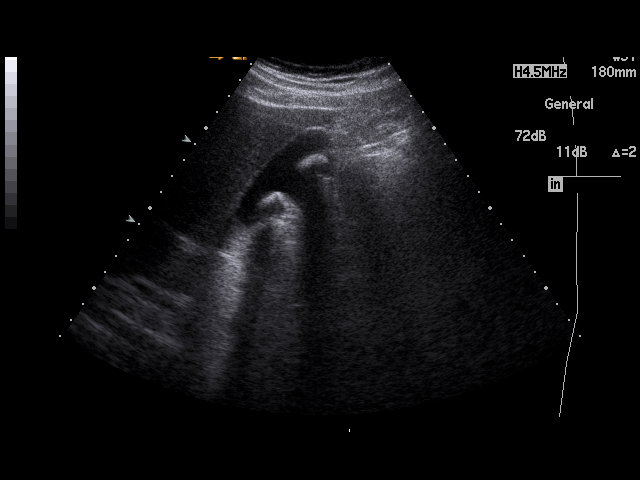
[im 20/23]
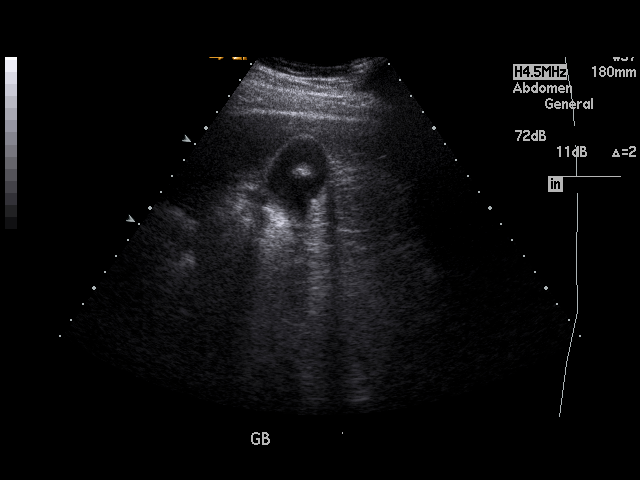
[im 21/23]
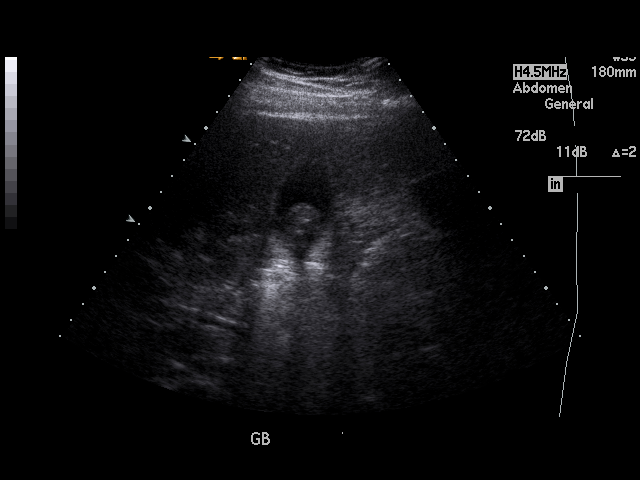
[im 23/23]
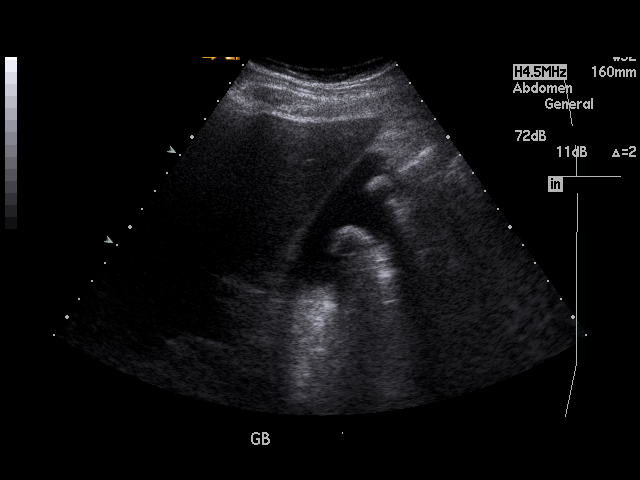

[17 of 23 positions shown; findings below may reference images not displayed]

PROCEDURE:     US  - US ABDOMEN LIMITED SURVEY  - July 24, 2011  [DATE]

RESULT:     Limited abdominal sonogram of the right upper quadrant
demonstrates cholelithiasis with several large stones present. Some of the
stones appear to change position relative to the gallbladder as the patient
changes position but one appears to be impacted in the gallbladder neck. A
positive sonographic Murphy's sign is demonstrated. The gallbladder wall is
thickened to 3.5 mm diameter. The common bile duct diameter is at the upper
limits of normal at 5.1 mm. The pancreas was not visualized.
IMPRESSION: Cholelithiasis with findings of a probable impacted stone
in the gallbladder neck, gallbladder wall thickening and positive
sonographic Murphy's sign. The findings are concerning for acute
cholecystitis. Clinical and laboratory correlation is recommended. Surgical
consultation is suggested.

## 2012-06-05 LAB — CBC
HCT: 53 % — ABNORMAL HIGH (ref 40.0–52.0)
HGB: 17.2 g/dL (ref 13.0–18.0)
MCH: 30.3 pg (ref 26.0–34.0)
MCHC: 32.4 g/dL (ref 32.0–36.0)
MCV: 93 fL (ref 80–100)
Platelet: 234 10*3/uL (ref 150–440)
RBC: 5.68 10*6/uL (ref 4.40–5.90)
RDW: 13 % (ref 11.5–14.5)
WBC: 14.5 10*3/uL — ABNORMAL HIGH (ref 3.8–10.6)

## 2012-06-05 LAB — COMPREHENSIVE METABOLIC PANEL
Albumin: 3.9 g/dL (ref 3.4–5.0)
Bilirubin,Total: 0.3 mg/dL (ref 0.2–1.0)
Calcium, Total: 8.7 mg/dL (ref 8.5–10.1)
Creatinine: 0.96 mg/dL (ref 0.60–1.30)
EGFR (Non-African Amer.): >60
Glucose: 97 mg/dL (ref 65–99)
Sodium: 140 mmol/L (ref 136–145)
Total Protein: 7.6 g/dL (ref 6.4–8.2)

## 2012-06-05 LAB — ETHANOL: Ethanol: 239 mg/dL

## 2012-06-05 LAB — AMYLASE: Amylase: 38 U/L (ref 25–115)

## 2012-06-05 LAB — MAGNESIUM: Magnesium: 2 mg/dL

## 2012-06-05 LAB — LIPASE, BLOOD: Lipase: 87 U/L (ref 73–393)

## 2012-06-06 ENCOUNTER — Inpatient Hospital Stay: Payer: Self-pay | Admitting: Internal Medicine

## 2012-06-06 LAB — URINALYSIS, COMPLETE
Bilirubin,UR: NEGATIVE
Ph: 5 (ref 4.5–8.0)
Protein: NEGATIVE
Squamous Epithelial: NONE SEEN

## 2012-06-06 LAB — CBC WITH DIFFERENTIAL/PLATELET
Eosinophil #: 0.5 10*3/uL (ref 0.0–0.7)
HCT: 49.2 % (ref 40.0–52.0)
HGB: 16.2 g/dL (ref 13.0–18.0)
Lymphocyte #: 5 10*3/uL — ABNORMAL HIGH (ref 1.0–3.6)
Lymphocyte %: 44.3 %
MCH: 31 pg (ref 26.0–34.0)
MCHC: 32.9 g/dL (ref 32.0–36.0)
MCV: 94 fL (ref 80–100)
Monocyte %: 5.6 %
Neutrophil #: 5.1 10*3/uL (ref 1.4–6.5)
Platelet: 193 10*3/uL (ref 150–440)
RBC: 5.23 10*6/uL (ref 4.40–5.90)
RDW: 13 % (ref 11.5–14.5)
WBC: 11.3 10*3/uL — ABNORMAL HIGH (ref 3.8–10.6)

## 2012-06-06 LAB — TROPONIN I: Troponin-I: <0.02 ng/mL

## 2012-06-06 LAB — COMPREHENSIVE METABOLIC PANEL
Albumin: 3.4 g/dL (ref 3.4–5.0)
Alkaline Phosphatase: 82 U/L (ref 50–136)
BUN: 12 mg/dL (ref 7–18)
Bilirubin,Total: 0.4 mg/dL (ref 0.2–1.0)
Calcium, Total: 7.5 mg/dL — ABNORMAL LOW (ref 8.5–10.1)
Chloride: 113 mmol/L — ABNORMAL HIGH (ref 98–107)
Co2: 21 mmol/L (ref 21–32)
Creatinine: 0.86 mg/dL (ref 0.60–1.30)
EGFR (African American): >60
EGFR (Non-African Amer.): >60
Glucose: 87 mg/dL (ref 65–99)
Potassium: 5.5 mmol/L — ABNORMAL HIGH (ref 3.5–5.1)
SGOT(AST): 39 U/L — ABNORMAL HIGH (ref 15–37)
SGPT (ALT): 42 U/L
Total Protein: 6.8 g/dL (ref 6.4–8.2)

## 2012-06-06 LAB — PROTIME-INR: Prothrombin Time: 13.1 secs (ref 11.5–14.7)

## 2012-06-07 LAB — CBC WITH DIFFERENTIAL/PLATELET
Basophil #: 0 10*3/uL (ref 0.0–0.1)
Eosinophil #: 0 10*3/uL (ref 0.0–0.7)
Eosinophil %: 0.1 %
HCT: 47 % (ref 40.0–52.0)
HGB: 15.5 g/dL (ref 13.0–18.0)
Lymphocyte #: 0.9 10*3/uL — ABNORMAL LOW (ref 1.0–3.6)
Lymphocyte %: 14.7 %
MCHC: 33 g/dL (ref 32.0–36.0)
Monocyte %: 1.5 %
Neutrophil %: 83.4 %
Platelet: 162 10*3/uL (ref 150–440)
RDW: 13 % (ref 11.5–14.5)
WBC: 5.9 10*3/uL (ref 3.8–10.6)

## 2012-06-07 LAB — BASIC METABOLIC PANEL
Anion Gap: 9 (ref 7–16)
BUN: 10 mg/dL (ref 7–18)
Calcium, Total: 7.8 mg/dL — ABNORMAL LOW (ref 8.5–10.1)
Chloride: 109 mmol/L — ABNORMAL HIGH (ref 98–107)
Co2: 23 mmol/L (ref 21–32)
Creatinine: 0.7 mg/dL (ref 0.60–1.30)
EGFR (African American): >60
EGFR (Non-African Amer.): >60
Glucose: 178 mg/dL — ABNORMAL HIGH (ref 65–99)
Osmolality: 285 (ref 275–301)
Potassium: 4.6 mmol/L (ref 3.5–5.1)
Sodium: 141 mmol/L (ref 136–145)

## 2012-06-07 LAB — HEMOGLOBIN: HGB: 15.5 g/dL (ref 13.0–18.0)

## 2012-06-07 LAB — MAGNESIUM: Magnesium: 2.1 mg/dL

## 2012-06-09 LAB — PATHOLOGY REPORT

## 2012-06-11 ENCOUNTER — Emergency Department: Payer: Self-pay | Admitting: Emergency Medicine

## 2012-06-11 LAB — COMPREHENSIVE METABOLIC PANEL
Albumin: 3.7 g/dL (ref 3.4–5.0)
Alkaline Phosphatase: 94 U/L (ref 50–136)
BUN: 20 mg/dL — ABNORMAL HIGH (ref 7–18)
Bilirubin,Total: 0.4 mg/dL (ref 0.2–1.0)
Co2: 24 mmol/L (ref 21–32)
Creatinine: 1.13 mg/dL (ref 0.60–1.30)
EGFR (African American): >60
Glucose: 90 mg/dL (ref 65–99)
Potassium: 3.9 mmol/L (ref 3.5–5.1)
SGOT(AST): 48 U/L — ABNORMAL HIGH (ref 15–37)
SGPT (ALT): 62 U/L
Total Protein: 7.1 g/dL (ref 6.4–8.2)

## 2012-06-11 LAB — DRUG SCREEN, URINE
Barbiturates, Ur Screen: NEGATIVE (ref ?–200)
Benzodiazepine, Ur Scrn: NEGATIVE (ref ?–200)
Cannabinoid 50 Ng, Ur ~~LOC~~: NEGATIVE (ref ?–50)
Cocaine Metabolite,Ur ~~LOC~~: POSITIVE (ref ?–300)
Opiate, Ur Screen: NEGATIVE (ref ?–300)
Tricyclic, Ur Screen: NEGATIVE (ref ?–1000)

## 2012-06-11 LAB — URINALYSIS, COMPLETE
Bacteria: NONE SEEN
Ketone: NEGATIVE
Leukocyte Esterase: NEGATIVE
Ph: 6 (ref 4.5–8.0)
Protein: NEGATIVE
WBC UR: NONE SEEN /HPF (ref 0–5)

## 2012-06-11 LAB — CBC
HGB: 16.8 g/dL (ref 13.0–18.0)
MCHC: 34.1 g/dL (ref 32.0–36.0)
RBC: 5.34 10*6/uL (ref 4.40–5.90)
WBC: 16.2 10*3/uL — ABNORMAL HIGH (ref 3.8–10.6)

## 2012-06-11 LAB — SALICYLATE LEVEL: Salicylates, Serum: 2.3 mg/dL

## 2012-06-11 LAB — LIPASE, BLOOD: Lipase: 107 U/L (ref 73–393)

## 2012-06-11 LAB — ACETAMINOPHEN LEVEL: Acetaminophen: <2 ug/mL

## 2012-06-11 LAB — TSH: Thyroid Stimulating Horm: 1.43 u[IU]/mL

## 2012-06-11 LAB — ETHANOL: Ethanol %: 0.255 % — ABNORMAL HIGH (ref 0.000–0.080)

## 2012-06-13 ENCOUNTER — Inpatient Hospital Stay: Payer: Self-pay | Admitting: Psychiatry

## 2012-06-13 LAB — URINALYSIS, COMPLETE
Bacteria: NONE SEEN
Bilirubin,UR: NEGATIVE
Blood: NEGATIVE
Nitrite: NEGATIVE
RBC,UR: <1 /HPF (ref 0–5)
Specific Gravity: 1.015 (ref 1.003–1.030)
Squamous Epithelial: NONE SEEN
WBC UR: 1 /HPF (ref 0–5)

## 2012-06-13 LAB — DRUG SCREEN, URINE
Amphetamines, Ur Screen: NEGATIVE (ref ?–1000)
Barbiturates, Ur Screen: NEGATIVE (ref ?–200)
Benzodiazepine, Ur Scrn: NEGATIVE (ref ?–200)
Cocaine Metabolite,Ur ~~LOC~~: POSITIVE (ref ?–300)
MDMA (Ecstasy)Ur Screen: NEGATIVE (ref ?–500)
Methadone, Ur Screen: NEGATIVE (ref ?–300)
Opiate, Ur Screen: NEGATIVE (ref ?–300)

## 2012-06-13 LAB — CBC
HCT: 51.2 % (ref 40.0–52.0)
HGB: 17 g/dL (ref 13.0–18.0)
MCH: 30.7 pg (ref 26.0–34.0)
MCV: 92 fL (ref 80–100)
Platelet: 239 10*3/uL (ref 150–440)
RDW: 13.2 % (ref 11.5–14.5)
WBC: 17.3 10*3/uL — ABNORMAL HIGH (ref 3.8–10.6)

## 2012-06-13 LAB — COMPREHENSIVE METABOLIC PANEL
Albumin: 3.8 g/dL (ref 3.4–5.0)
Anion Gap: 11 (ref 7–16)
BUN: 14 mg/dL (ref 7–18)
Bilirubin,Total: 0.4 mg/dL (ref 0.2–1.0)
Calcium, Total: 8.2 mg/dL — ABNORMAL LOW (ref 8.5–10.1)
Co2: 25 mmol/L (ref 21–32)
Osmolality: 269 (ref 275–301)
Potassium: 3.6 mmol/L (ref 3.5–5.1)
SGOT(AST): 47 U/L — ABNORMAL HIGH (ref 15–37)
SGPT (ALT): 65 U/L
Total Protein: 7.4 g/dL (ref 6.4–8.2)

## 2012-06-13 LAB — ETHANOL
Ethanol %: 0.272 % — ABNORMAL HIGH (ref 0.000–0.080)
Ethanol: 272 mg/dL

## 2012-06-15 LAB — CBC WITH DIFFERENTIAL/PLATELET
Basophil #: 0 10*3/uL (ref 0.0–0.1)
Basophil %: 0.3 %
HCT: 47.2 % (ref 40.0–52.0)
HGB: 15.7 g/dL (ref 13.0–18.0)
Lymphocyte %: 44.8 %
MCH: 30.7 pg (ref 26.0–34.0)
MCV: 93 fL (ref 80–100)
Monocyte %: 9.8 %
Neutrophil #: 3.5 10*3/uL (ref 1.4–6.5)
Neutrophil %: 40.1 %
RDW: 13.1 % (ref 11.5–14.5)

## 2012-07-20 IMAGING — CT CT CHEST W/ CM
1 series · 15 of 33 positions shown, 19 images · non-contrast
Comparison: none

REASON FOR EXAM: chest pain - R, pleuritic - tachycardia - eval for pe
COMMENTS:

[Series 4: soft tissue · axial · 0.72mm/px · z∈[-410,-125]mm · 15 of 113 slices shown, 19 images]
[im 9/113  mediastinal]
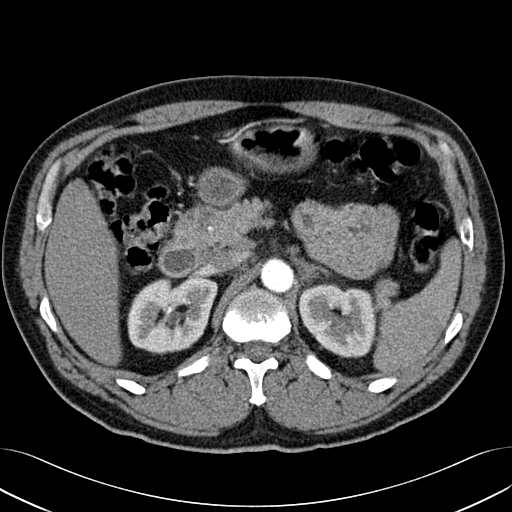
[im 9/113  lung]
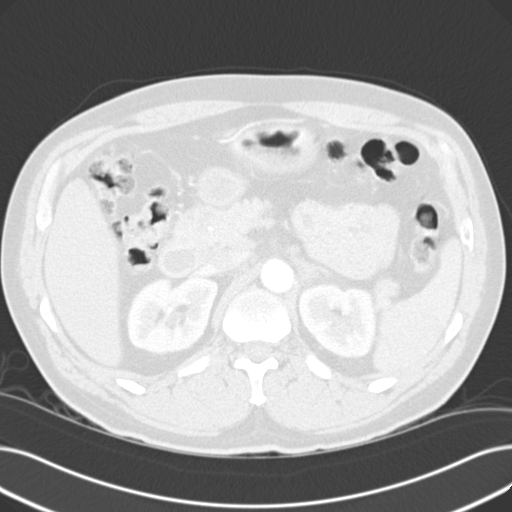
[im 17/113  lung]
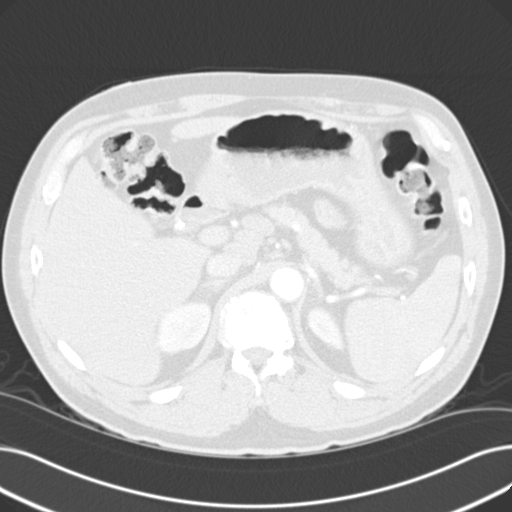
[im 23/113  lung]
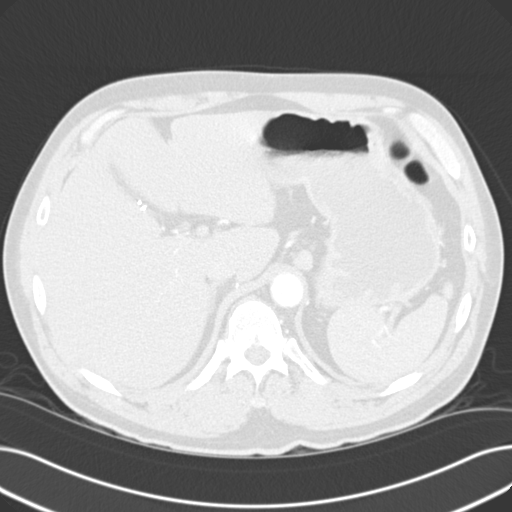
[im 30/113  lung]
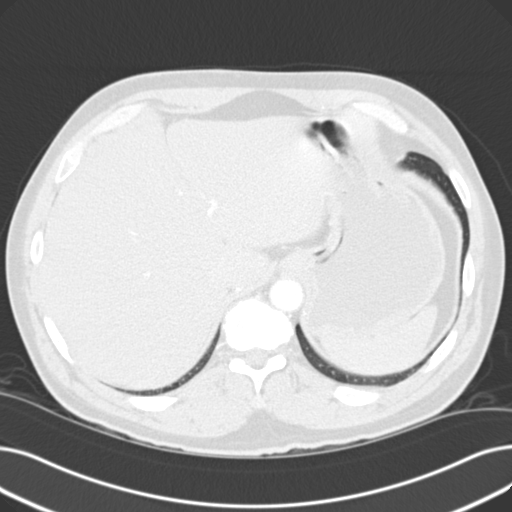
[im 38/113  mediastinal]
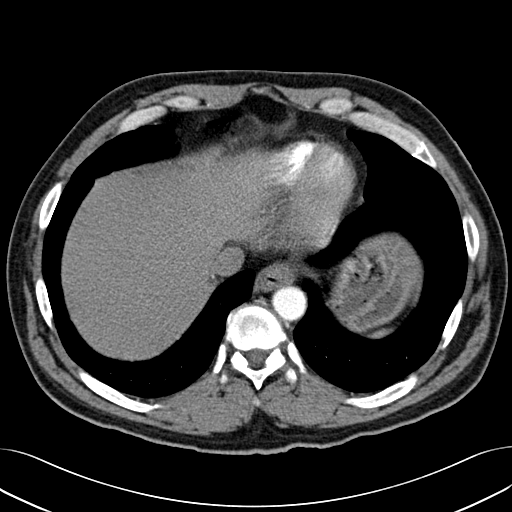
[im 38/113  lung]
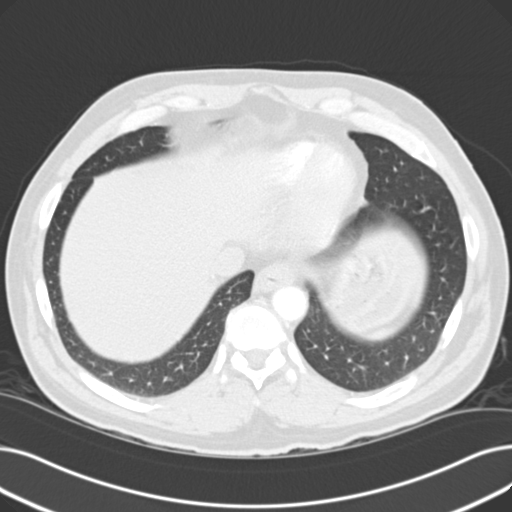
[im 45/113  lung]
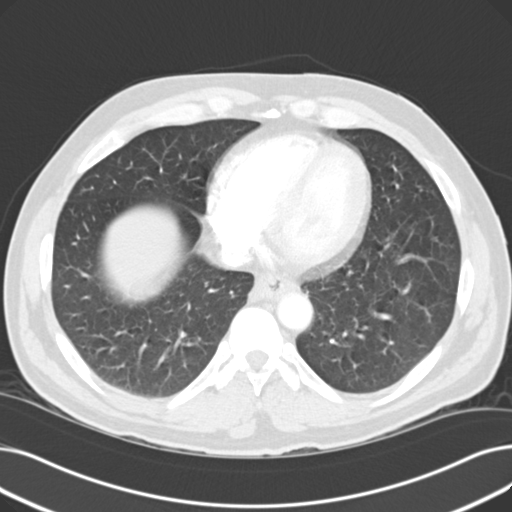
[im 50/113  lung]
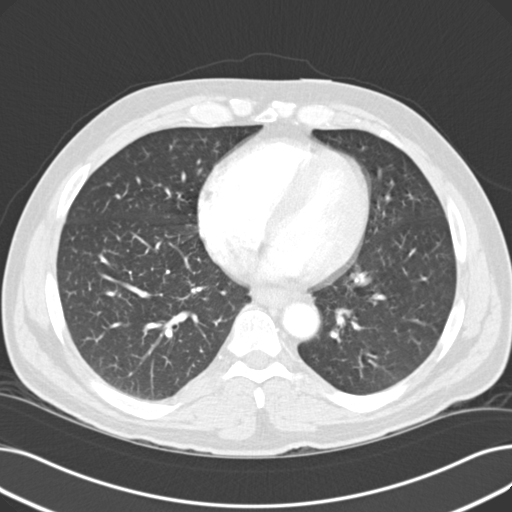
[im 59/113  lung]
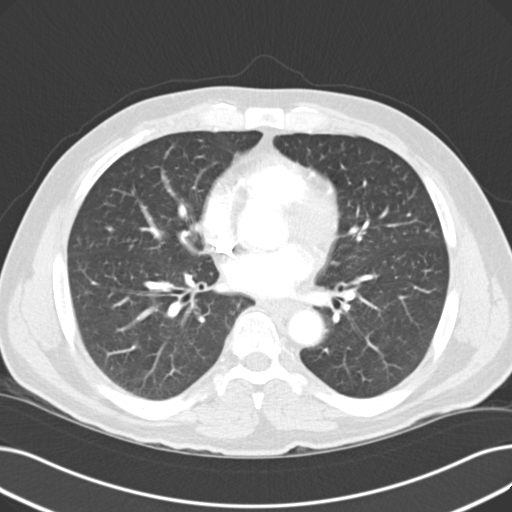
[im 63/113  mediastinal]
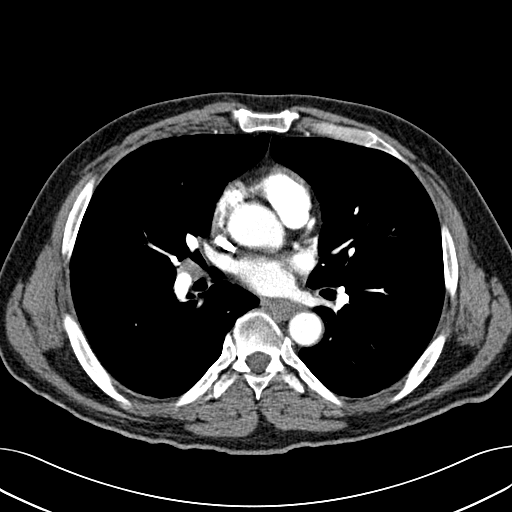
[im 63/113  lung]
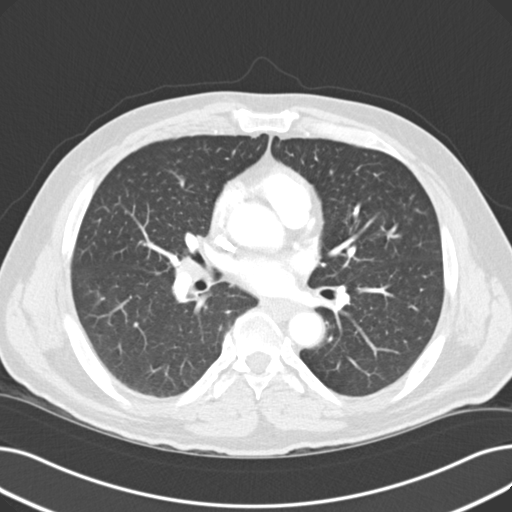
[im 68/113  lung]
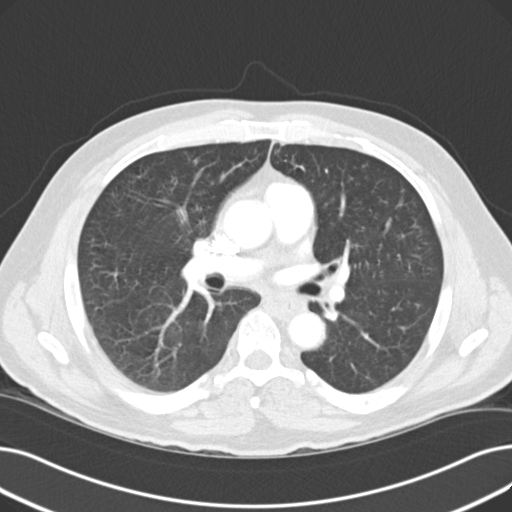
[im 75/113  lung]
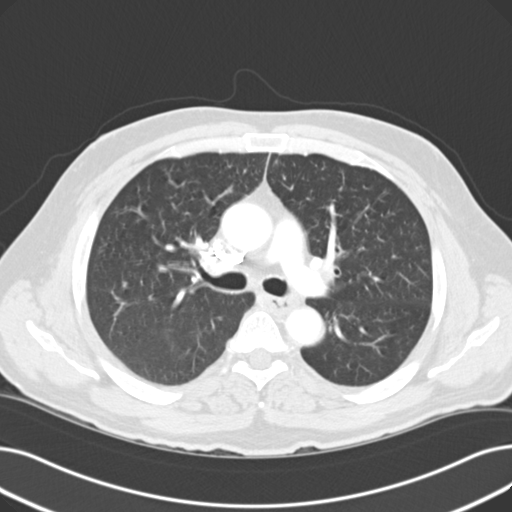
[im 83/113  lung]
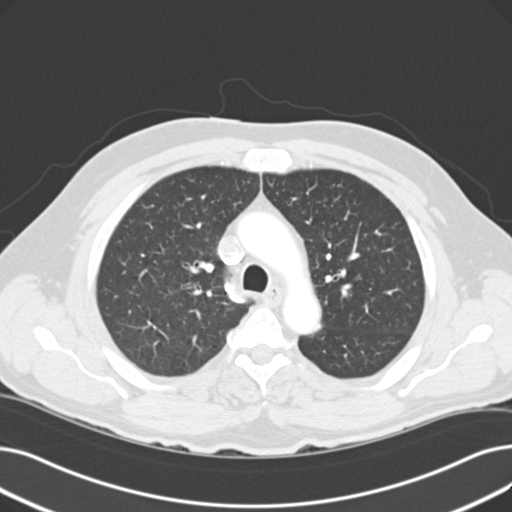
[im 90/113  mediastinal]
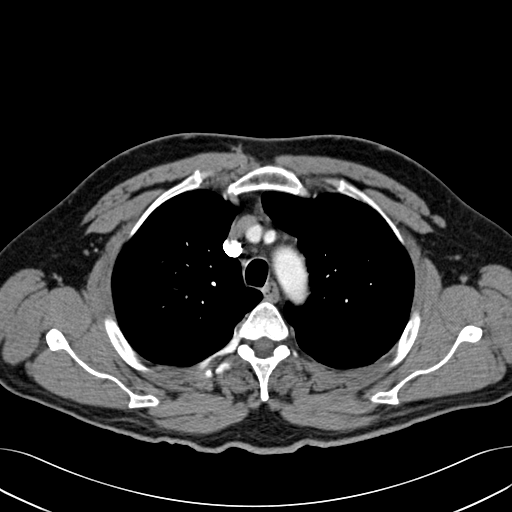
[im 90/113  lung]
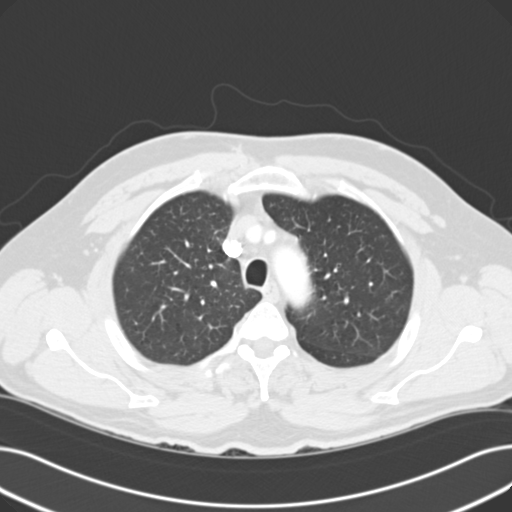
[im 96/113  lung]
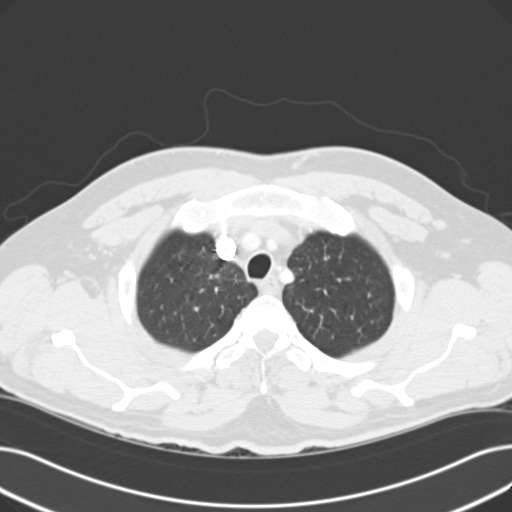
[im 104/113  lung]
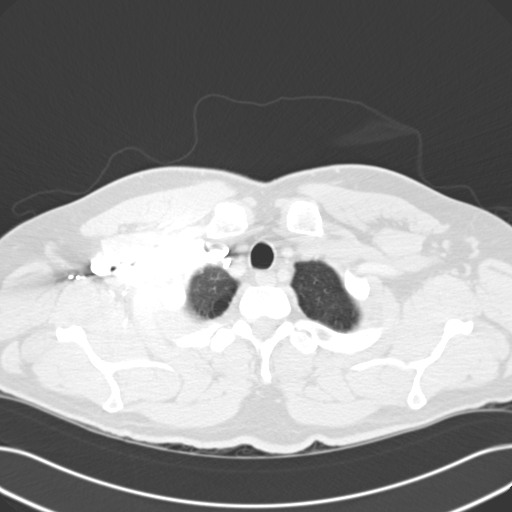

[15 of 33 positions shown; findings below may reference images not displayed]

PROCEDURE:     CT  - CT CHEST (FOR PE) W  - September 23, 2011 [DATE]

RESULT:     Axial CT scanning was performed through the chest at 3 mm
intervals and slice thicknesses following intravenous administration of 100
cc of [id number]. Review of multiplanar reconstructed images was performed
separately on the VIA monitor.

Contrast within the pulmonary arterial tree is normal in appearance. I do
not see evidence of an acute pulmonary embolism. The caliber of the thoracic
aorta is normal. I see no pleural nor pericardial effusion. The cardiac
chambers are normal in size. Mild thickening of the wall of the distal
esophagus is present and there is a small hiatal hernia. At lung window
settings there are emphysematous changes bilaterally. I see no suspicious
pulmonary parenchymal nodules.

Within the upper abdomen the observed portions of the liver and spleen are
normal. I see no adrenal masses. The gallbladder is surgically absent. There
are enlarged lymph nodes anterior to the upper inferior vena cava proximal
to the caudate lobe of the liver seen best on images 96 through 103. The
thoracic vertebral bodies are preserved in height.
IMPRESSION: 1. I do not see evidence of an acute pulmonary embolism.
2. There are emphysematous changes in both lungs. There is no evidence of
CHF nor pneumonia.
3. There is thickening of the wall of the distal esophagus and there is a
small hiatal hernia.
4. There are enlarged lymph nodes inferior to the caudate lobe of the liver
at anterior to the inferior vena cava.

A preliminary report was sent to the [HOSPITAL] the conclusion
of the study.

## 2012-07-20 IMAGING — CR DG CHEST 1V PORT
1 series · 1 of 1 positions shown · non-contrast
Comparison: none

REASON FOR EXAM: Chest Pain
COMMENTS:

PROCEDURE:     DXR - DXR PORTABLE CHEST SINGLE VIEW  - September 23, 2011 [DATE]
RESULT:     Comparison is made to the prior exam of 06/10/2011. The lung
fields are clear. The heart, mediastinal and osseous structures show no
significant abnormalities.

[view not recorded]
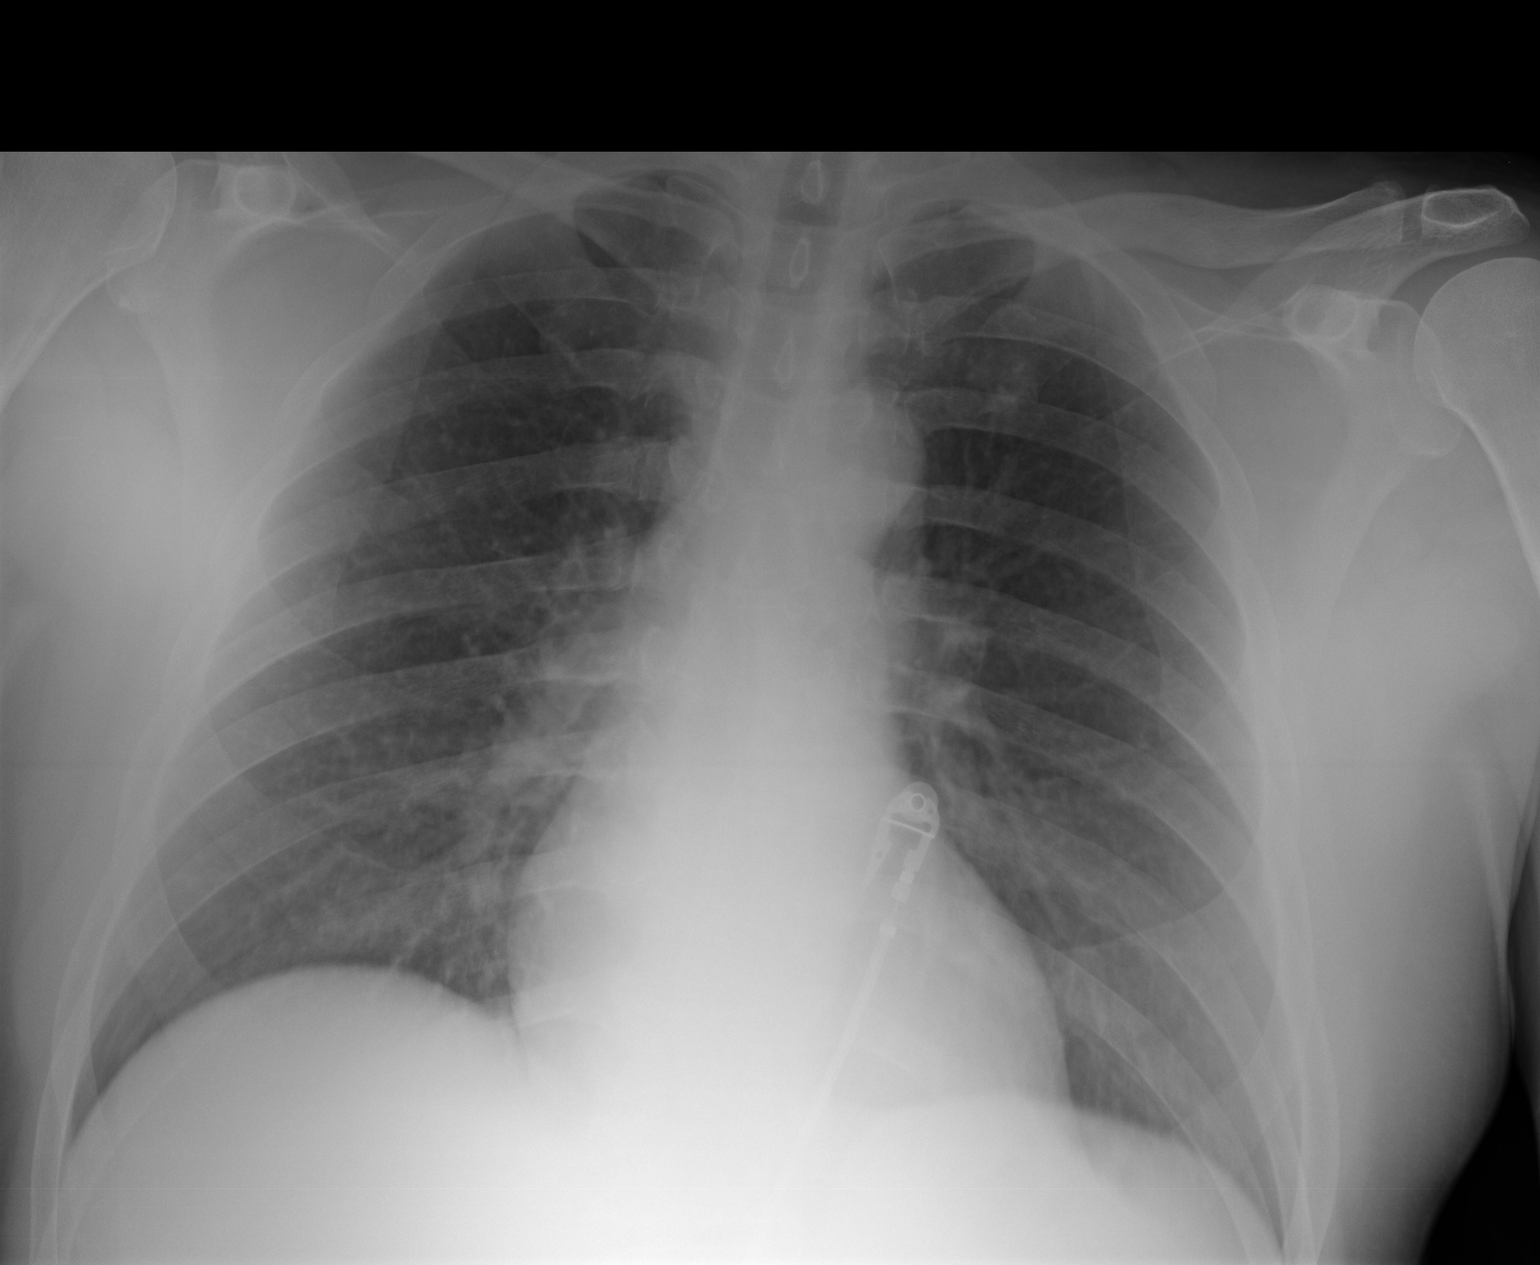

[1 of 1 positions shown; findings below may reference images not displayed]

IMPRESSION: 1.     No acute changes are identified.

## 2012-08-26 ENCOUNTER — Emergency Department: Payer: Self-pay | Admitting: Emergency Medicine

## 2012-08-26 LAB — COMPREHENSIVE METABOLIC PANEL
BUN: 18 mg/dL (ref 7–18)
Bilirubin,Total: 0.5 mg/dL (ref 0.2–1.0)
Chloride: 104 mmol/L (ref 98–107)
Co2: 25 mmol/L (ref 21–32)
Creatinine: 1.01 mg/dL (ref 0.60–1.30)
EGFR (African American): >60
EGFR (Non-African Amer.): >60
Osmolality: 270 (ref 275–301)
Potassium: 4.5 mmol/L (ref 3.5–5.1)
Sodium: 134 mmol/L — ABNORMAL LOW (ref 136–145)
Total Protein: 7.7 g/dL (ref 6.4–8.2)

## 2012-08-26 LAB — URINALYSIS, COMPLETE
Bilirubin,UR: NEGATIVE
Blood: NEGATIVE
Ketone: NEGATIVE
Ph: 5 (ref 4.5–8.0)
Squamous Epithelial: NONE SEEN

## 2012-08-26 LAB — CBC
MCV: 93 fL (ref 80–100)
Platelet: 225 10*3/uL (ref 150–440)
RBC: 5.73 10*6/uL (ref 4.40–5.90)
RDW: 13.7 % (ref 11.5–14.5)
WBC: 11.7 10*3/uL — ABNORMAL HIGH (ref 3.8–10.6)

## 2012-08-26 LAB — TROPONIN I: Troponin-I: <0.02 ng/mL

## 2012-09-14 ENCOUNTER — Inpatient Hospital Stay: Payer: Self-pay | Admitting: Internal Medicine

## 2012-09-14 LAB — COMPREHENSIVE METABOLIC PANEL
Anion Gap: 12 (ref 7–16)
BUN: 12 mg/dL (ref 7–18)
Bilirubin,Total: 0.3 mg/dL (ref 0.2–1.0)
Calcium, Total: 8.7 mg/dL (ref 8.5–10.1)
Chloride: 102 mmol/L (ref 98–107)
Creatinine: 0.97 mg/dL (ref 0.60–1.30)
EGFR (African American): >60
Osmolality: 273 (ref 275–301)
Potassium: 4.1 mmol/L (ref 3.5–5.1)
SGOT(AST): 32 U/L (ref 15–37)
Total Protein: 7.2 g/dL (ref 6.4–8.2)

## 2012-09-14 LAB — CBC
HCT: 49.5 % (ref 40.0–52.0)
MCH: 32.4 pg (ref 26.0–34.0)
MCHC: 35 g/dL (ref 32.0–36.0)
MCV: 93 fL (ref 80–100)
Platelet: 174 10*3/uL (ref 150–440)
RDW: 13.5 % (ref 11.5–14.5)

## 2012-09-14 LAB — DRUG SCREEN, URINE
Amphetamines, Ur Screen: NEGATIVE (ref ?–1000)
Barbiturates, Ur Screen: NEGATIVE (ref ?–200)
Cocaine Metabolite,Ur ~~LOC~~: POSITIVE (ref ?–300)
MDMA (Ecstasy)Ur Screen: NEGATIVE (ref ?–500)
Methadone, Ur Screen: NEGATIVE (ref ?–300)
Opiate, Ur Screen: NEGATIVE (ref ?–300)
Phencyclidine (PCP) Ur S: NEGATIVE (ref ?–25)
Tricyclic, Ur Screen: NEGATIVE (ref ?–1000)

## 2012-09-14 LAB — URINALYSIS, COMPLETE
Bilirubin,UR: NEGATIVE
Blood: NEGATIVE
Glucose,UR: NEGATIVE mg/dL (ref 0–75)
Leukocyte Esterase: NEGATIVE
Nitrite: NEGATIVE
Squamous Epithelial: NONE SEEN
WBC UR: <1 /HPF (ref 0–5)

## 2012-09-14 LAB — PROTIME-INR
INR: 1
Prothrombin Time: 13.3 secs (ref 11.5–14.7)

## 2012-09-14 LAB — PHOSPHORUS: Phosphorus: 3.4 mg/dL (ref 2.5–4.9)

## 2012-09-14 LAB — ETHANOL: Ethanol %: 0.157 % — ABNORMAL HIGH (ref 0.000–0.080)

## 2012-09-14 LAB — MAGNESIUM: Magnesium: 1.9 mg/dL

## 2012-09-14 LAB — TROPONIN I: Troponin-I: <0.02 ng/mL

## 2012-09-14 LAB — CK TOTAL AND CKMB (NOT AT ARMC): CK, Total: 141 U/L (ref 35–232)

## 2012-09-15 LAB — CBC WITH DIFFERENTIAL/PLATELET
Basophil #: 0.1 10*3/uL (ref 0.0–0.1)
Eosinophil #: 0.5 10*3/uL (ref 0.0–0.7)
HCT: 47.6 % (ref 40.0–52.0)
Lymphocyte %: 42.3 %
MCHC: 34.2 g/dL (ref 32.0–36.0)
Monocyte %: 7.6 %
Neutrophil #: 4.6 10*3/uL (ref 1.4–6.5)
Neutrophil %: 44.1 %
Platelet: 157 10*3/uL (ref 150–440)
RDW: 13.7 % (ref 11.5–14.5)
WBC: 10.3 10*3/uL (ref 3.8–10.6)

## 2012-09-15 LAB — LIPID PANEL
HDL Cholesterol: 35 mg/dL — ABNORMAL LOW (ref 40–60)
Ldl Cholesterol, Calc: 67 mg/dL (ref 0–100)
Triglycerides: 167 mg/dL (ref 0–200)
VLDL Cholesterol, Calc: 33 mg/dL (ref 5–40)

## 2012-09-15 LAB — TSH: Thyroid Stimulating Horm: 0.356 u[IU]/mL — ABNORMAL LOW

## 2012-09-15 LAB — BASIC METABOLIC PANEL
BUN: 11 mg/dL (ref 7–18)
Calcium, Total: 8.2 mg/dL — ABNORMAL LOW (ref 8.5–10.1)
EGFR (African American): >60
EGFR (Non-African Amer.): >60
Glucose: 155 mg/dL — ABNORMAL HIGH (ref 65–99)
Osmolality: 280 (ref 275–301)
Potassium: 4.4 mmol/L (ref 3.5–5.1)
Sodium: 139 mmol/L (ref 136–145)

## 2012-09-15 LAB — TROPONIN I: Troponin-I: <0.02 ng/mL

## 2012-09-16 LAB — T4, FREE: Free Thyroxine: 0.97 ng/dL (ref 0.76–1.46)

## 2012-09-17 LAB — BASIC METABOLIC PANEL
Anion Gap: 6 — ABNORMAL LOW (ref 7–16)
BUN: 13 mg/dL (ref 7–18)
Chloride: 102 mmol/L (ref 98–107)
Creatinine: 0.92 mg/dL (ref 0.60–1.30)
EGFR (African American): >60
EGFR (Non-African Amer.): >60
Osmolality: 279 (ref 275–301)
Potassium: 4.6 mmol/L (ref 3.5–5.1)

## 2012-09-17 LAB — HEMOGLOBIN: HGB: 16.2 g/dL (ref 13.0–18.0)

## 2012-10-29 LAB — COMPREHENSIVE METABOLIC PANEL
Anion Gap: 10 (ref 7–16)
BUN: 10 mg/dL (ref 7–18)
Bilirubin,Total: 0.4 mg/dL (ref 0.2–1.0)
Chloride: 107 mmol/L (ref 98–107)
Co2: 24 mmol/L (ref 21–32)
Creatinine: 0.86 mg/dL (ref 0.60–1.30)
EGFR (African American): >60
EGFR (Non-African Amer.): >60
Osmolality: 280 (ref 275–301)
Potassium: 4.1 mmol/L (ref 3.5–5.1)
Sodium: 141 mmol/L (ref 136–145)
Total Protein: 7.8 g/dL (ref 6.4–8.2)

## 2012-10-29 LAB — URINALYSIS, COMPLETE
Bacteria: NONE SEEN
Glucose,UR: NEGATIVE mg/dL (ref 0–75)
Ketone: NEGATIVE
Leukocyte Esterase: NEGATIVE
Protein: NEGATIVE
Squamous Epithelial: NONE SEEN

## 2012-10-29 LAB — CBC
HCT: 51.9 % (ref 40.0–52.0)
HGB: 18 g/dL (ref 13.0–18.0)
MCH: 31 pg (ref 26.0–34.0)
MCHC: 34.6 g/dL (ref 32.0–36.0)
MCV: 90 fL (ref 80–100)
Platelet: 235 10*3/uL (ref 150–440)
RDW: 12.6 % (ref 11.5–14.5)
WBC: 16.5 10*3/uL — ABNORMAL HIGH (ref 3.8–10.6)

## 2012-10-29 LAB — DRUG SCREEN, URINE
Barbiturates, Ur Screen: NEGATIVE (ref ?–200)
Benzodiazepine, Ur Scrn: NEGATIVE (ref ?–200)
Cannabinoid 50 Ng, Ur ~~LOC~~: NEGATIVE (ref ?–50)
Cocaine Metabolite,Ur ~~LOC~~: POSITIVE (ref ?–300)
Methadone, Ur Screen: NEGATIVE (ref ?–300)
Tricyclic, Ur Screen: NEGATIVE (ref ?–1000)

## 2012-10-29 LAB — ETHANOL
Ethanol %: 0.174 % — ABNORMAL HIGH (ref 0.000–0.080)
Ethanol: 174 mg/dL

## 2012-10-29 LAB — TSH: Thyroid Stimulating Horm: 0.927 u[IU]/mL

## 2012-10-30 ENCOUNTER — Inpatient Hospital Stay: Payer: Self-pay | Admitting: Psychiatry

## 2012-10-30 LAB — TROPONIN I: Troponin-I: <0.02 ng/mL

## 2012-11-04 LAB — COMPREHENSIVE METABOLIC PANEL
Anion Gap: 4 — ABNORMAL LOW (ref 7–16)
BUN: 6 mg/dL — ABNORMAL LOW (ref 7–18)
Bilirubin,Total: 0.2 mg/dL (ref 0.2–1.0)
Chloride: 106 mmol/L (ref 98–107)
Co2: 31 mmol/L (ref 21–32)
Creatinine: 0.95 mg/dL (ref 0.60–1.30)
EGFR (African American): >60
EGFR (Non-African Amer.): >60
Osmolality: 280 (ref 275–301)
Potassium: 4.2 mmol/L (ref 3.5–5.1)
Sodium: 141 mmol/L (ref 136–145)
Total Protein: 5.6 g/dL — ABNORMAL LOW (ref 6.4–8.2)

## 2012-12-01 IMAGING — CR DG CHEST 2V
1 series · 2 of 2 positions shown · non-contrast
Comparison: none

REASON FOR EXAM: Chest Pain
COMMENTS:

[Series 1: pa · 0.17mm/px · 2 of 2 slices shown]
[im 1/2]
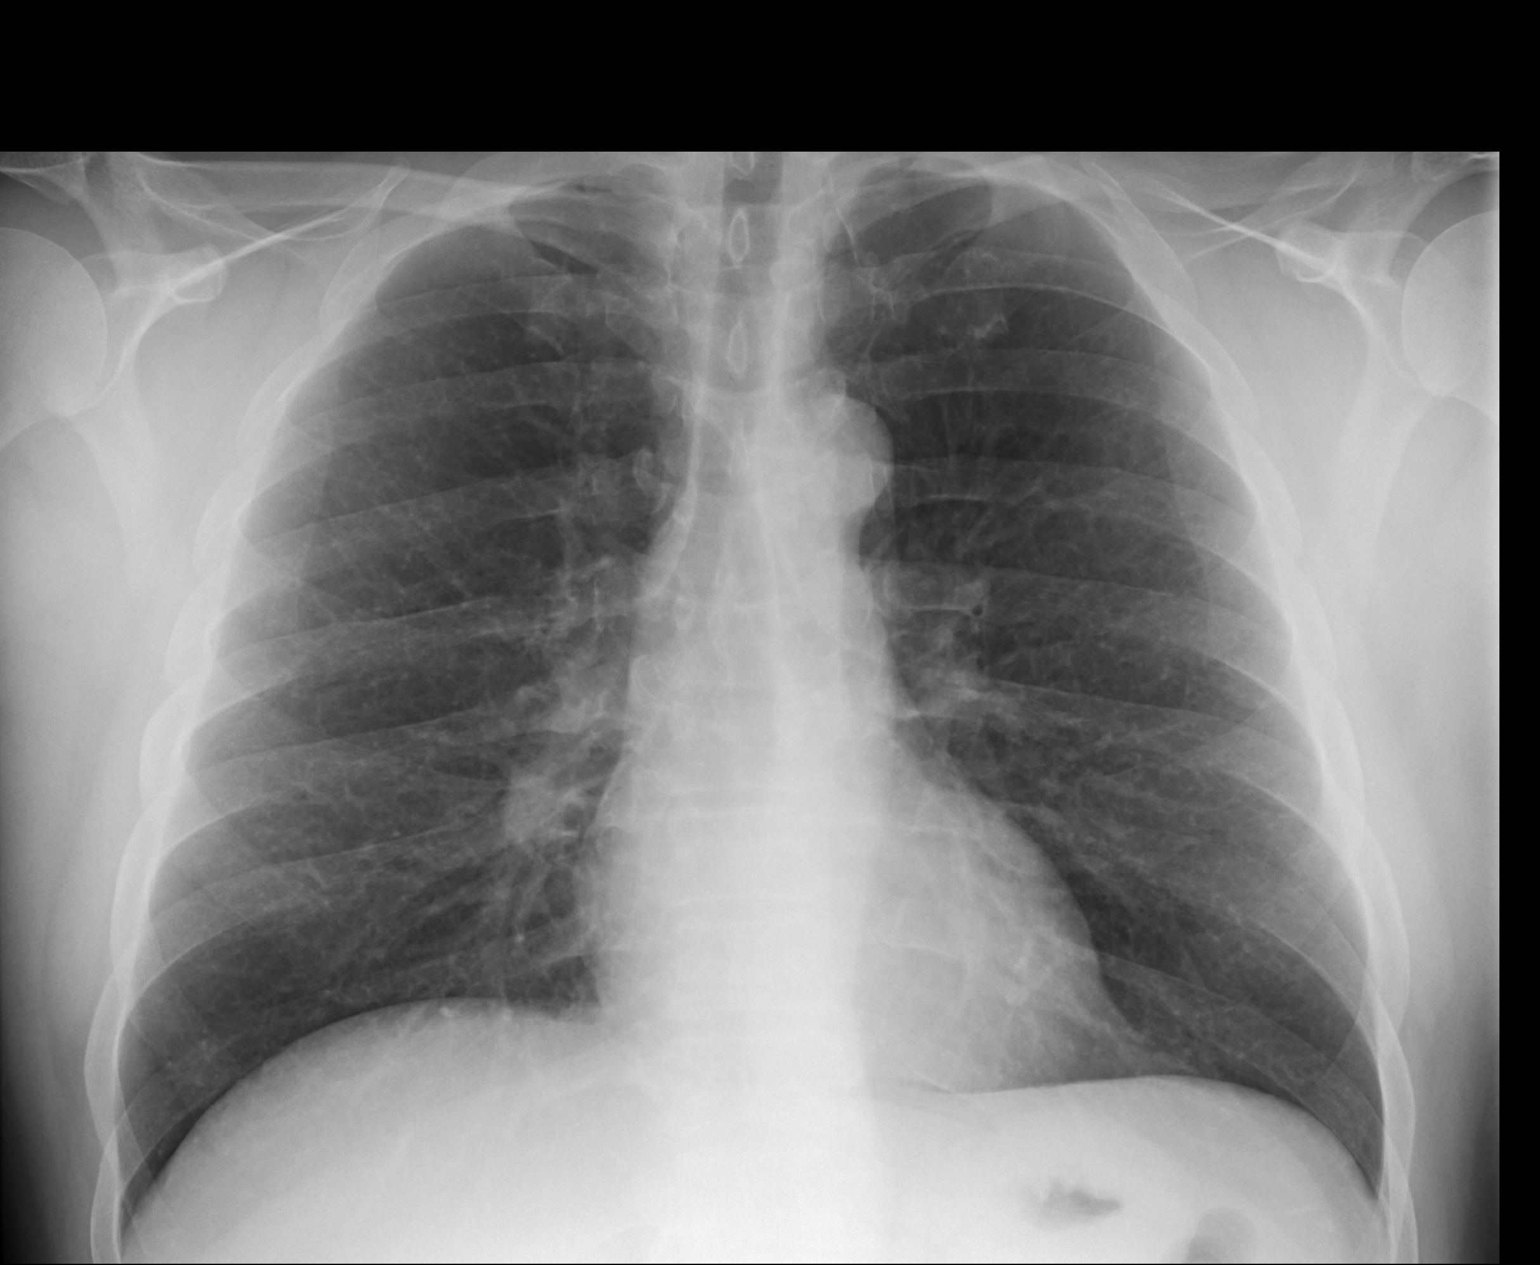
[im 2/2]
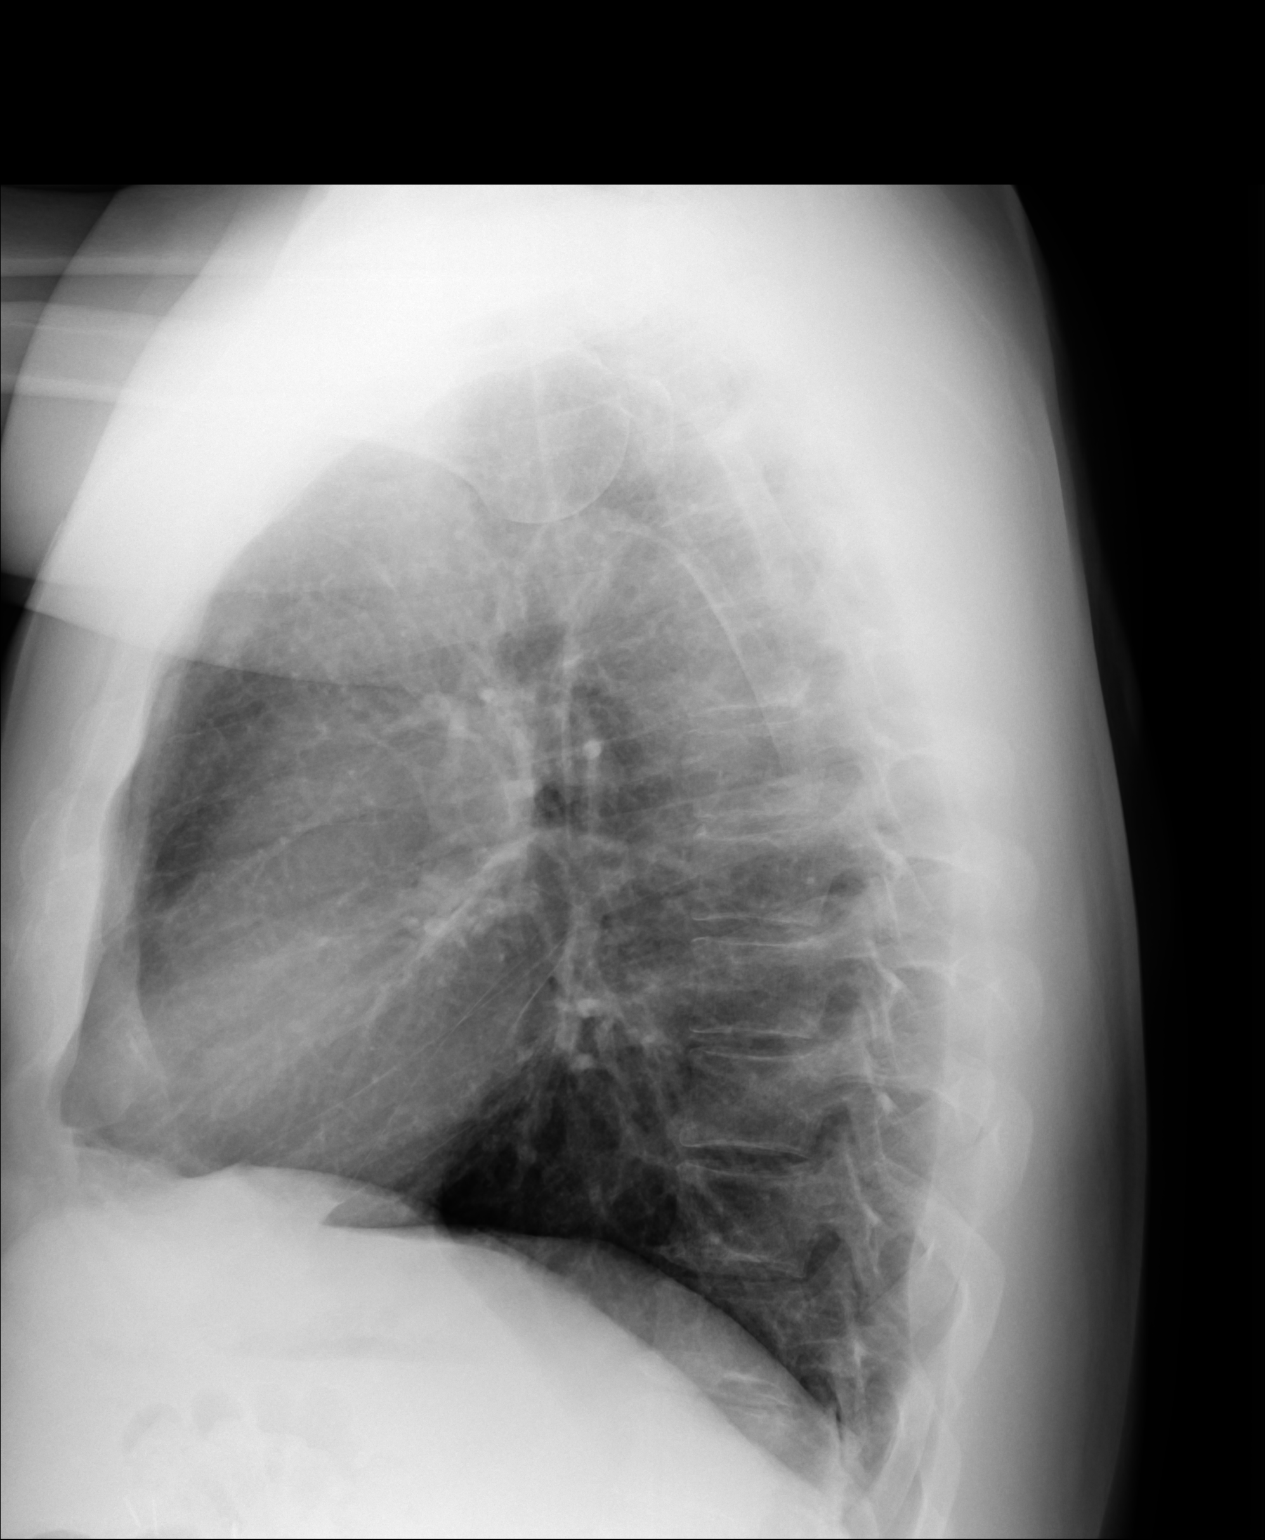

[2 of 2 positions shown; findings below may reference images not displayed]

PROCEDURE:     DXR - DXR CHEST PA (OR AP) AND LATERAL  - February 04, 2012  [DATE]

RESULT:     Comparison is made to the previous study dated to September 2011.

The lungs are clear. The heart and pulmonary vessels are normal. The bony
and mediastinal structures are unremarkable. There is no effusion. There is
no pneumothorax or evidence of congestive failure.
IMPRESSION: No acute cardiopulmonary disease.

## 2012-12-12 ENCOUNTER — Inpatient Hospital Stay: Payer: Self-pay | Admitting: Psychiatry

## 2012-12-12 LAB — COMPREHENSIVE METABOLIC PANEL
Albumin: 3.2 g/dL — ABNORMAL LOW (ref 3.4–5.0)
Anion Gap: 7 (ref 7–16)
BUN: 16 mg/dL (ref 7–18)
Bilirubin,Total: 0.5 mg/dL (ref 0.2–1.0)
Calcium, Total: 8.1 mg/dL — ABNORMAL LOW (ref 8.5–10.1)
Chloride: 111 mmol/L — ABNORMAL HIGH (ref 98–107)
EGFR (African American): >60
Potassium: 4 mmol/L (ref 3.5–5.1)
SGPT (ALT): 35 U/L (ref 12–78)
Total Protein: 6.2 g/dL — ABNORMAL LOW (ref 6.4–8.2)

## 2012-12-12 LAB — CBC WITH DIFFERENTIAL/PLATELET
Basophil %: 0.7 %
Eosinophil #: 0.3 10*3/uL (ref 0.0–0.7)
Eosinophil %: 2.9 %
HCT: 50 % (ref 40.0–52.0)
HGB: 16.9 g/dL (ref 13.0–18.0)
Lymphocyte #: 3.3 10*3/uL (ref 1.0–3.6)
MCH: 30.2 pg (ref 26.0–34.0)
MCV: 89 fL (ref 80–100)
Monocyte #: 0.6 x10 3/mm (ref 0.2–1.0)
Monocyte %: 6.5 %
RBC: 5.59 10*6/uL (ref 4.40–5.90)
RDW: 13.6 % (ref 11.5–14.5)
WBC: 9.9 10*3/uL (ref 3.8–10.6)

## 2012-12-16 IMAGING — CT CT HEAD WITHOUT CONTRAST
2 series · 15 of 30 positions shown, 19 images · non-contrast
Comparison: none

REASON FOR EXAM: syncope
COMMENTS:

[Series 2: without · axial · non-contrast · 0.44mm/px · z∈[+4,+139]mm · 13 of 33 slices shown, 17 images]
[im 3/33  brain]
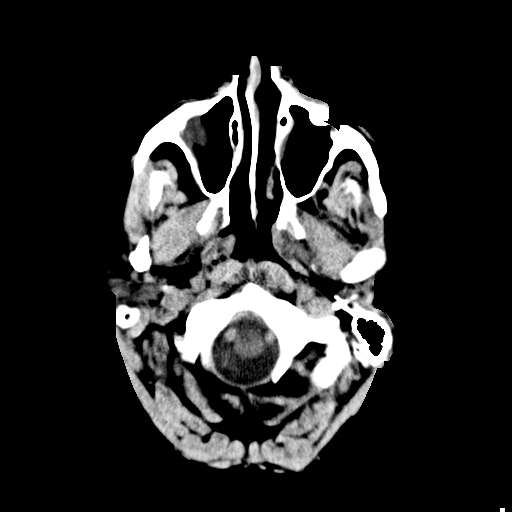
[im 3/33  bone]
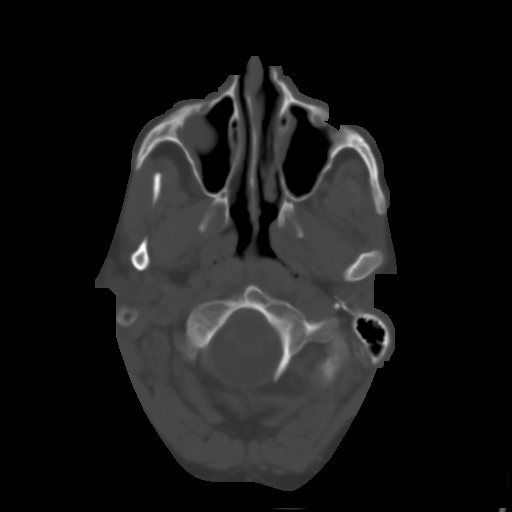
[im 5/33  brain]
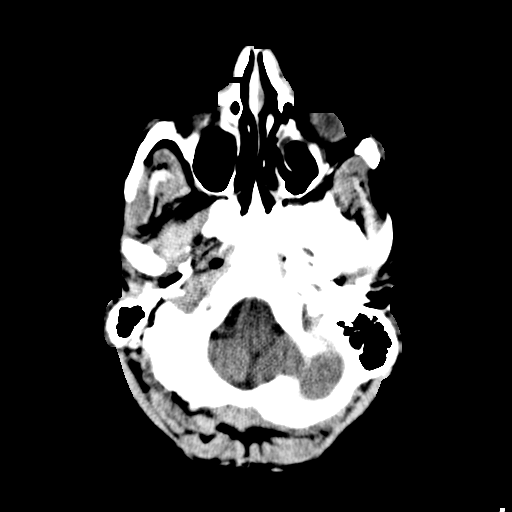
[im 7/33  brain]
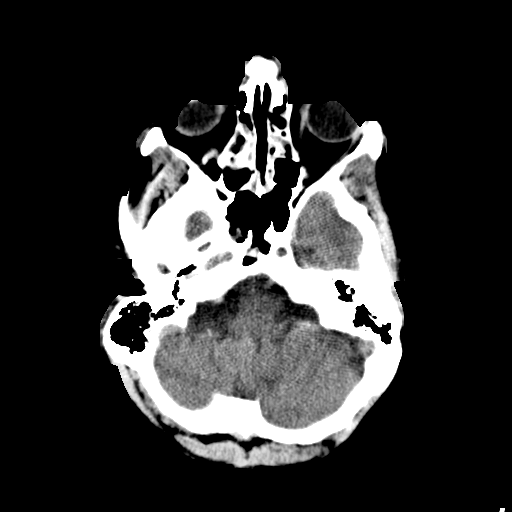
[im 10/33  brain]
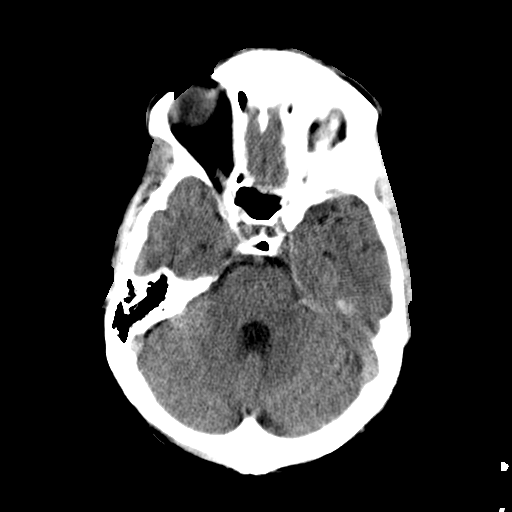
[im 12/33  brain]
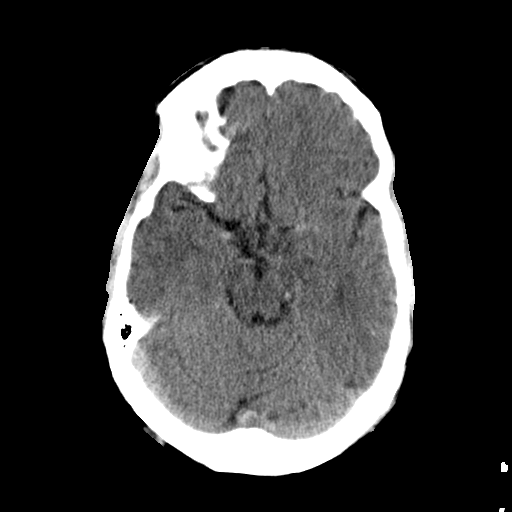
[im 12/33  bone]
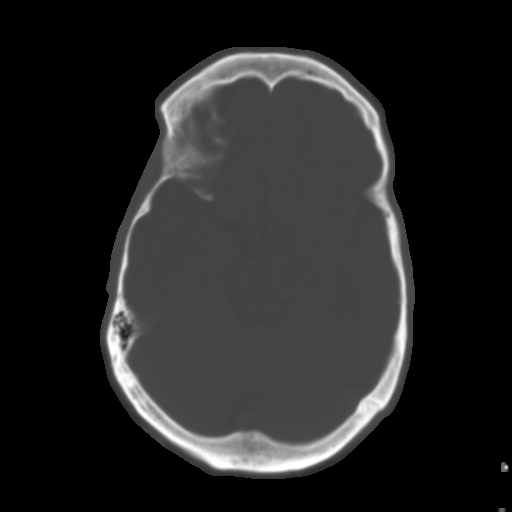
[im 14/33  brain]
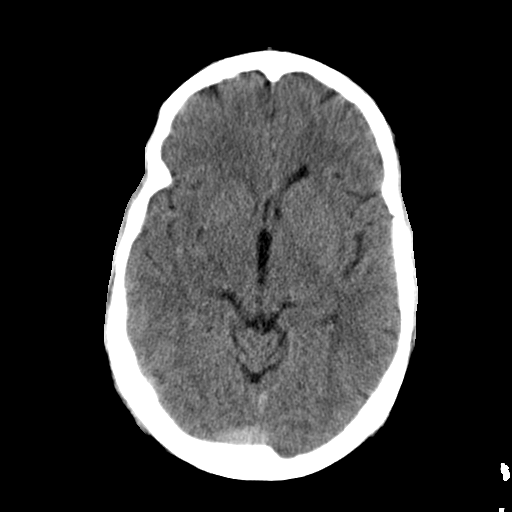
[im 17/33  brain]
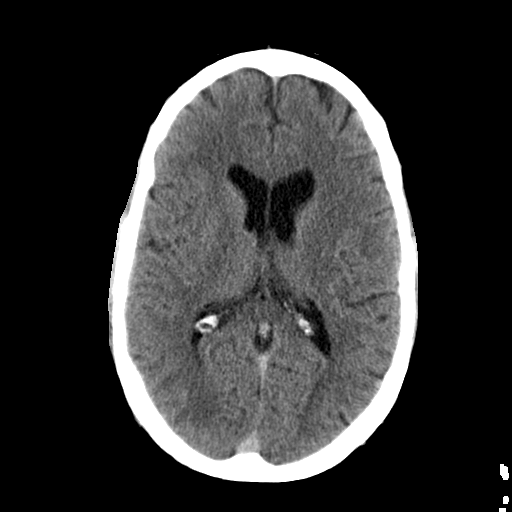
[im 19/33  brain]
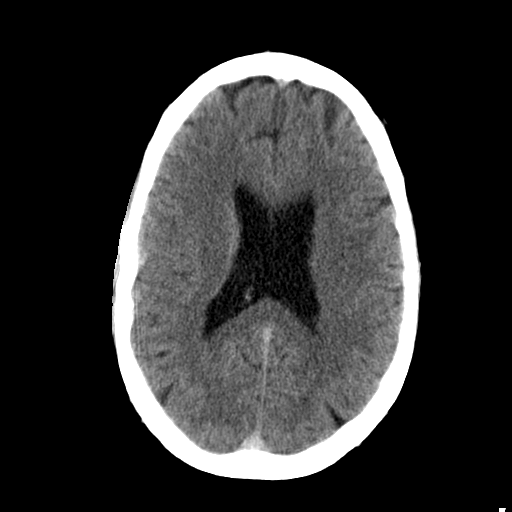
[im 21/33  brain]
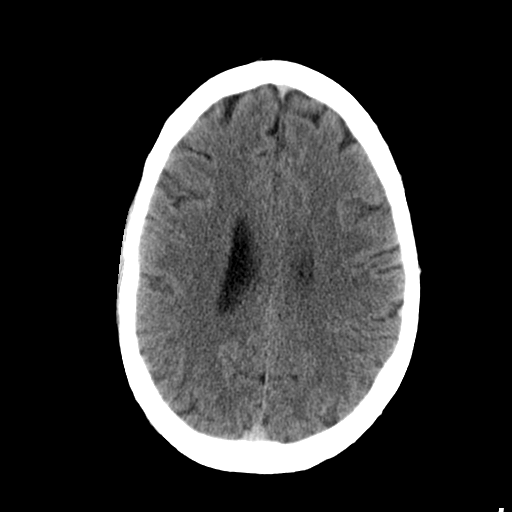
[im 21/33  bone]
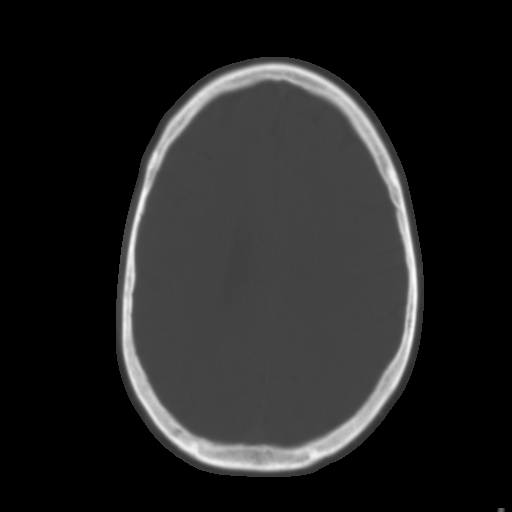
[im 23/33  brain]
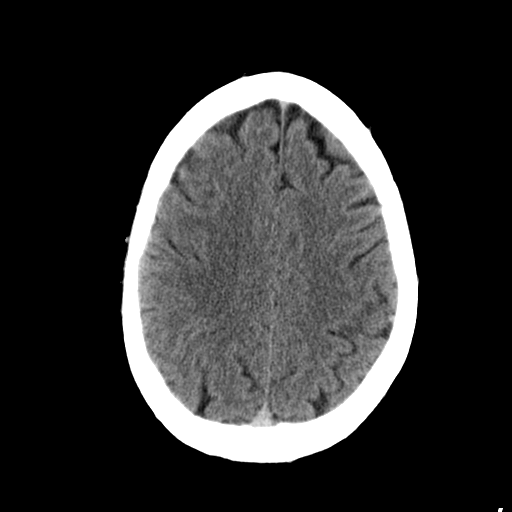
[im 26/33  brain]
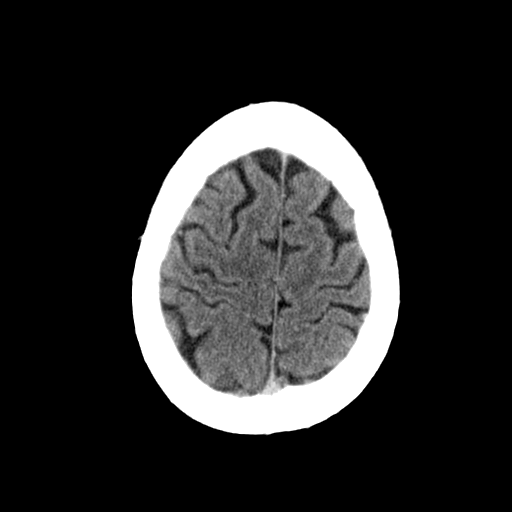
[im 28/33  brain]
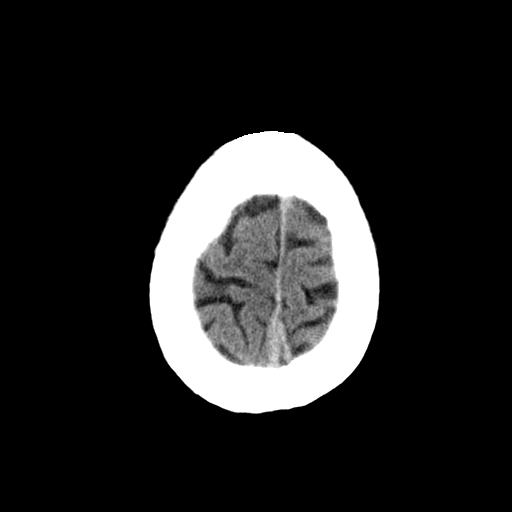
[im 30/33  brain]
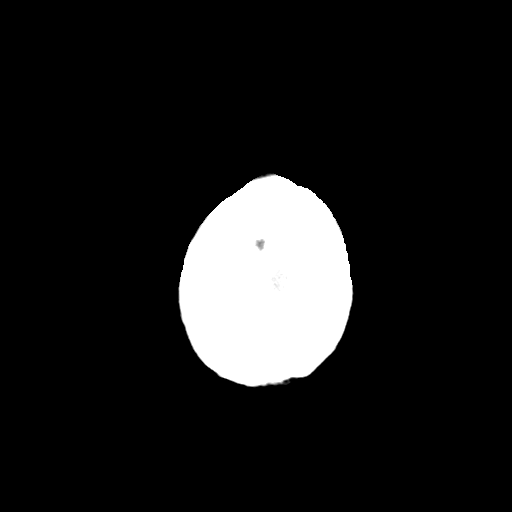
[im 30/33  bone]
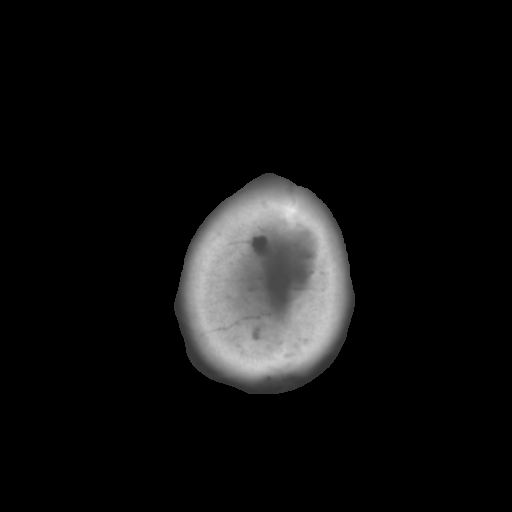

[Series 3: bone · axial · 0.44mm/px · z∈[+4,+24]mm · 2 of 33 slices shown]
[im 3/33  bone]
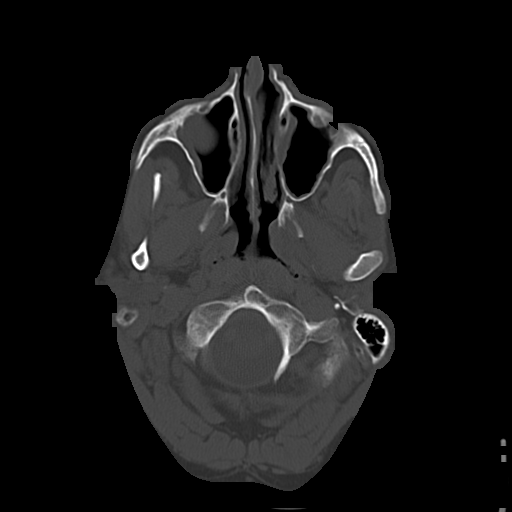
[im 7/33  bone]
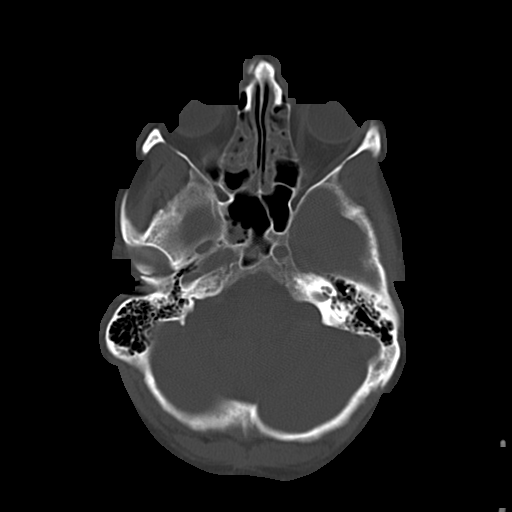

[15 of 30 positions shown; findings below may reference images not displayed]

PROCEDURE:     CT  - CT HEAD WITHOUT CONTRAST  - February 19, 2012  [DATE]

RESULT:     Axial noncontrast CT scanning was performed through the brain
with reconstructions at 5 mm intervals and slice thicknesses.

The there is mild diffuse cerebral atrophy with minimal compensatory
ventriculomegaly. There is no intracranial hemorrhage nor intracranial mass
effect. I see no abnormal intracranial calcifications. There is no evidence
of an evolving ischemic infarction. The cerebellum brainstem are normal in
appearance. At bone window settings there is mucoperiosteal thickening
within the maxillary and ethmoid sinuses. The frontal sinuses are
hypoplastic. The sphenoid sinuses and the mastoid air cells are clear. There
is no evidence of an acute skull fracture.
IMPRESSION: 1. I see no acute abnormality of the brain.
2. There are findings consistent with inflammation of theethmoid and
maxillary sinuses without air-fluid levels.

## 2012-12-16 IMAGING — US US CAROTID DUPLEX BILAT
1 series · 17 of 24 positions shown · non-contrast
Comparison: none

REASON FOR EXAM: syncope, episodes of L sided numbness
COMMENTS:

[Series 1: us carotid duplex bilat · 17 of 73 slices shown]
[im 1/73]
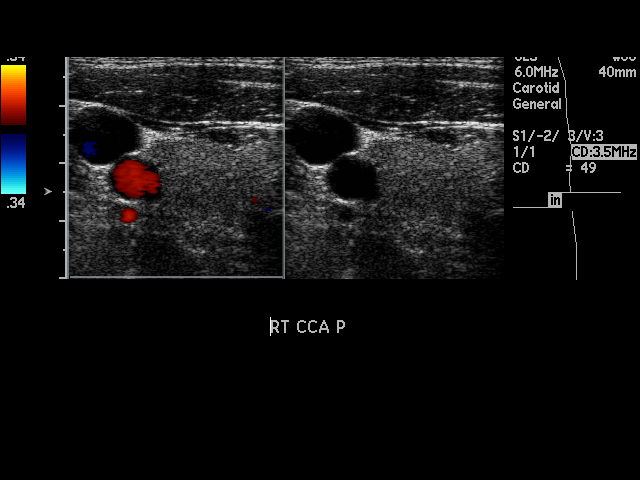
[im 7/73]
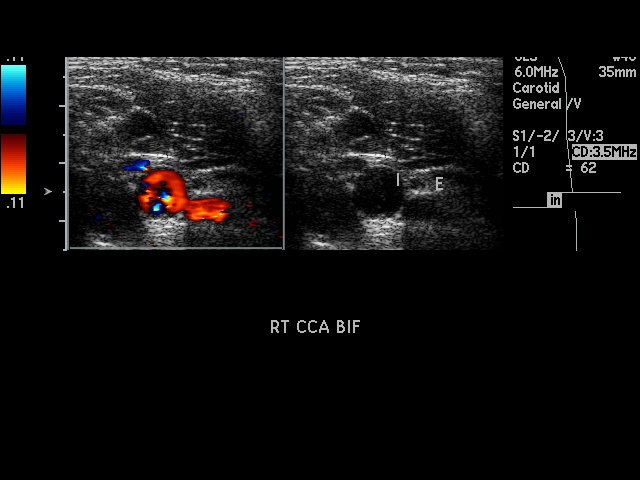
[im 10/73]
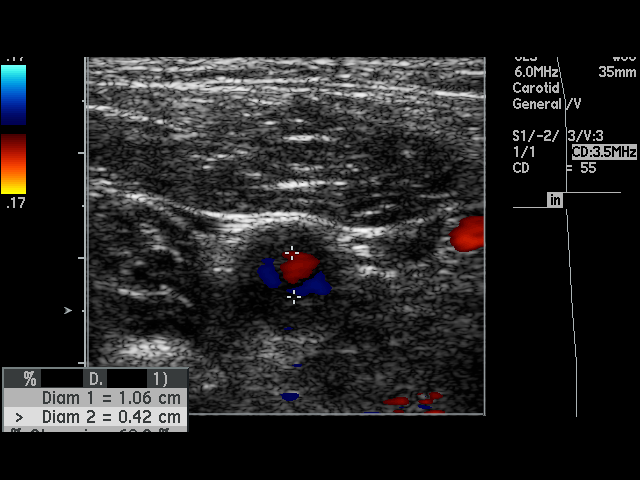
[im 13/73]
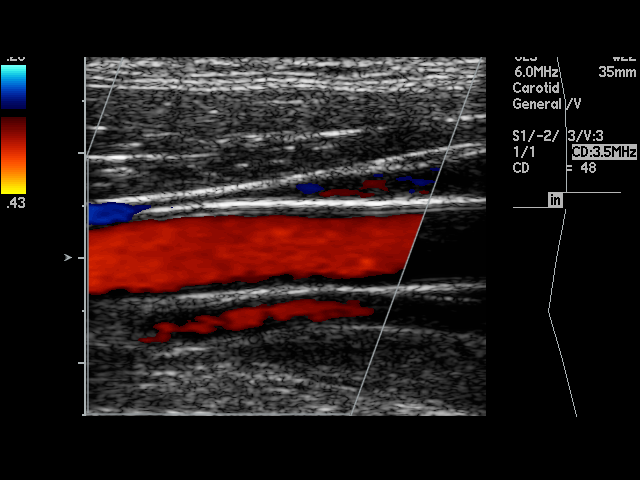
[im 19/73]
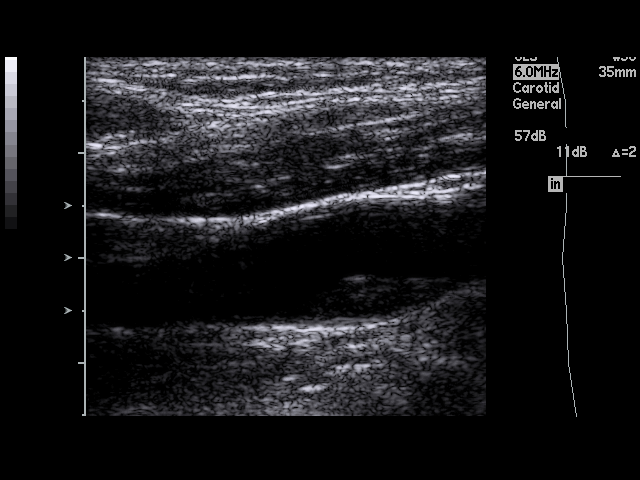
[im 22/73]
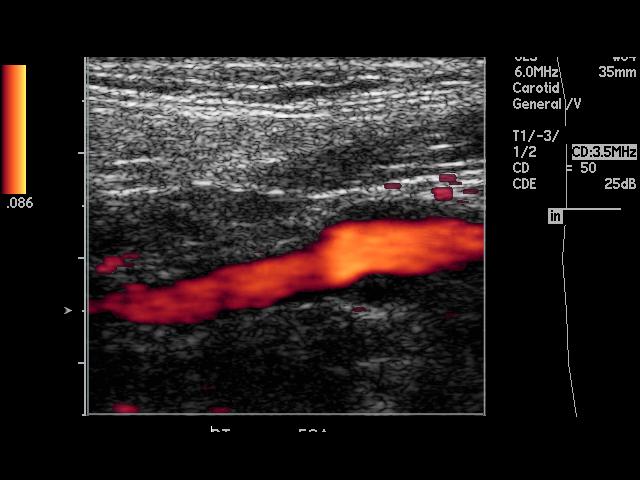
[im 29/73]
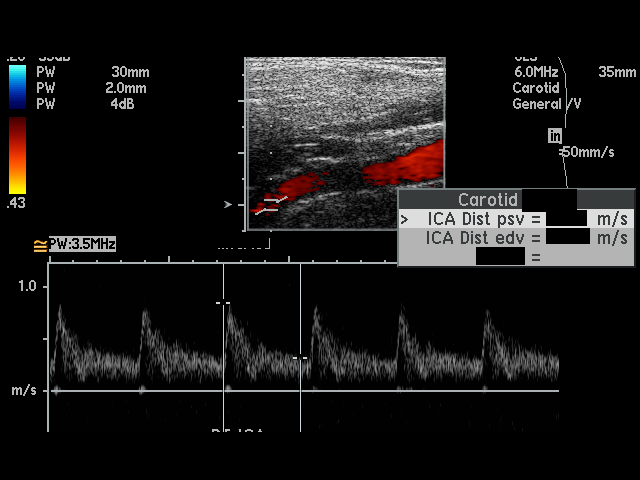
[im 32/73]
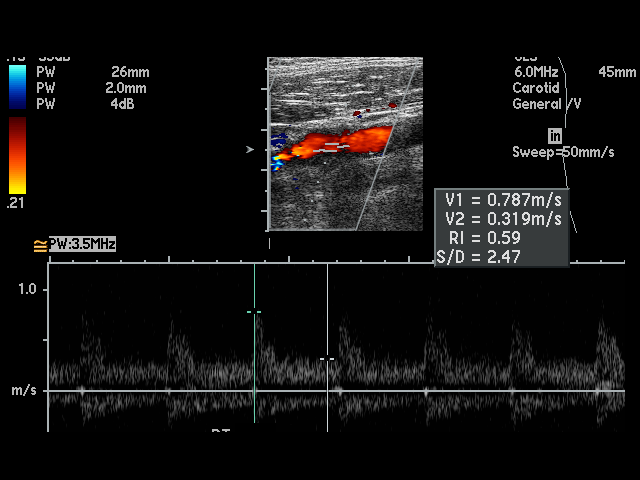
[im 38/73]
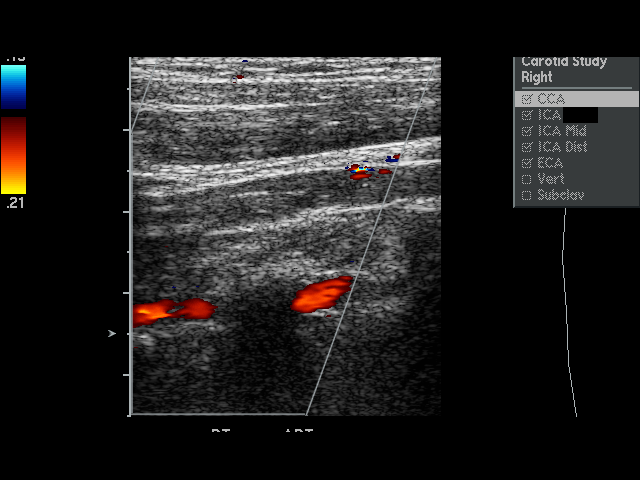
[im 41/73]
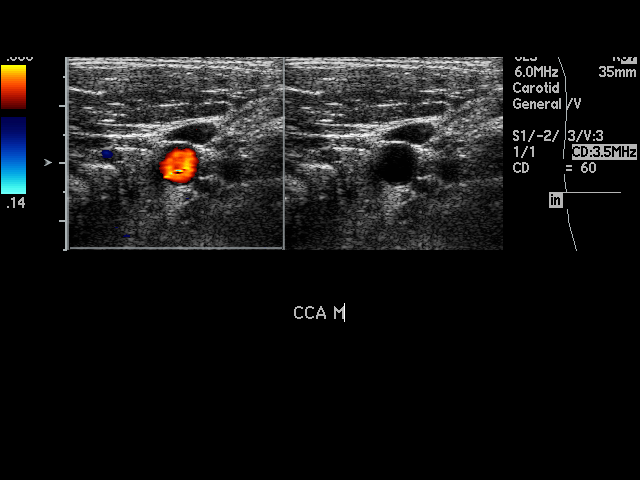
[im 44/73]
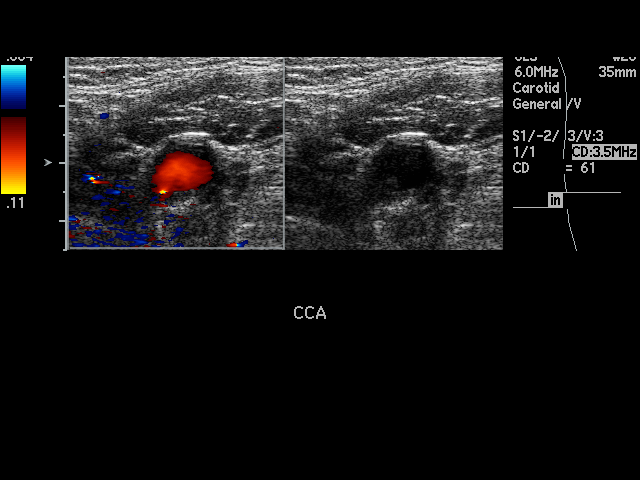
[im 51/73]
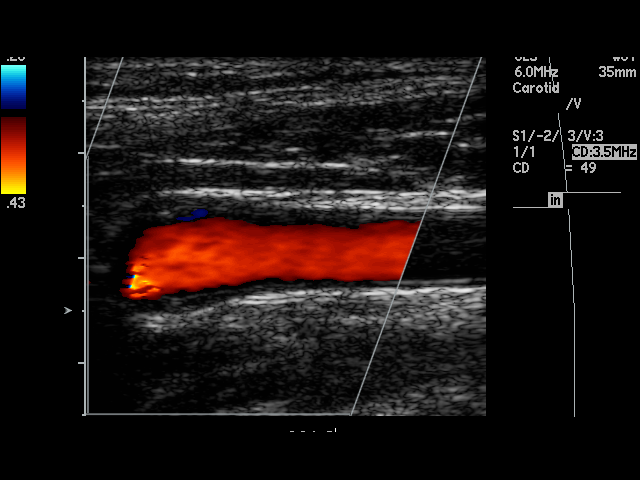
[im 54/73]
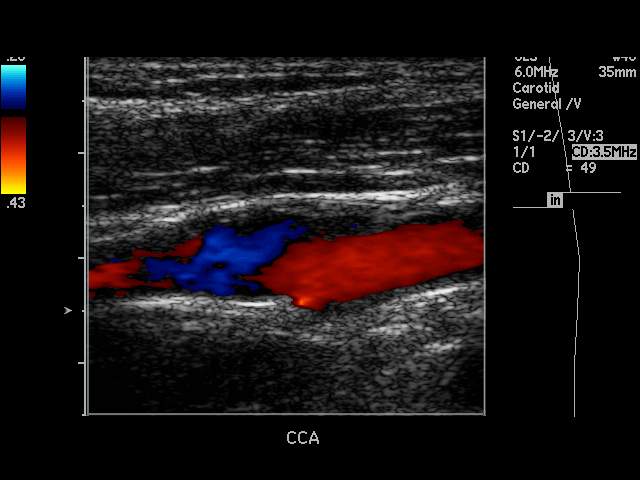
[im 60/73]
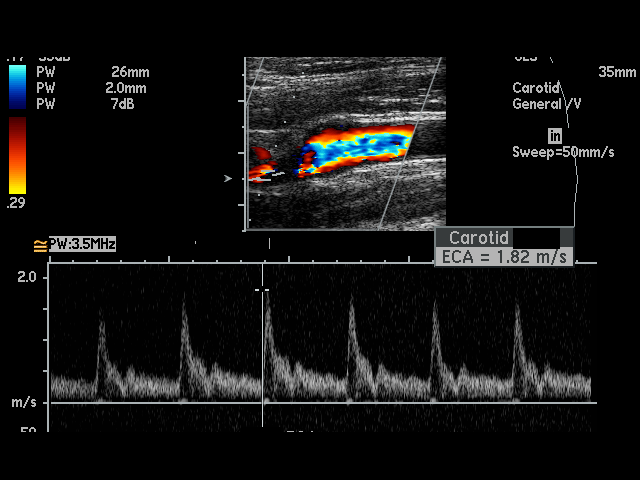
[im 63/73]
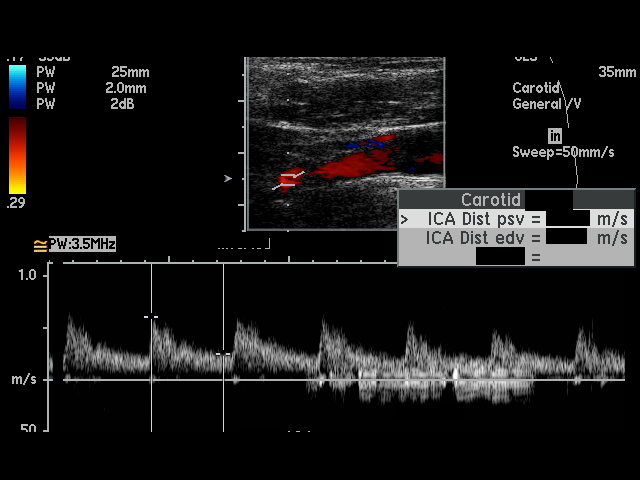
[im 66/73]
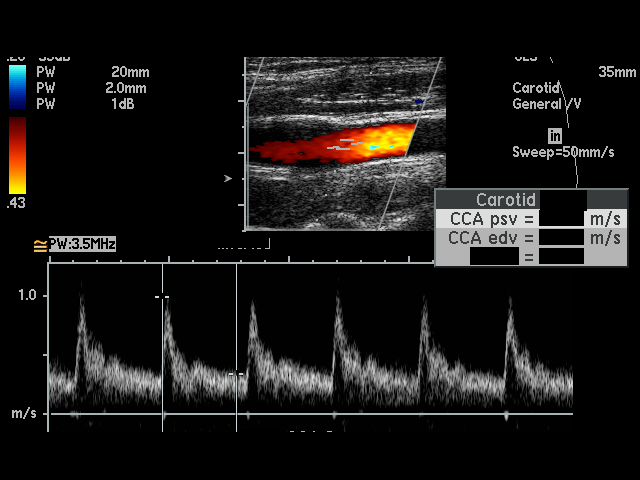
[im 73/73]
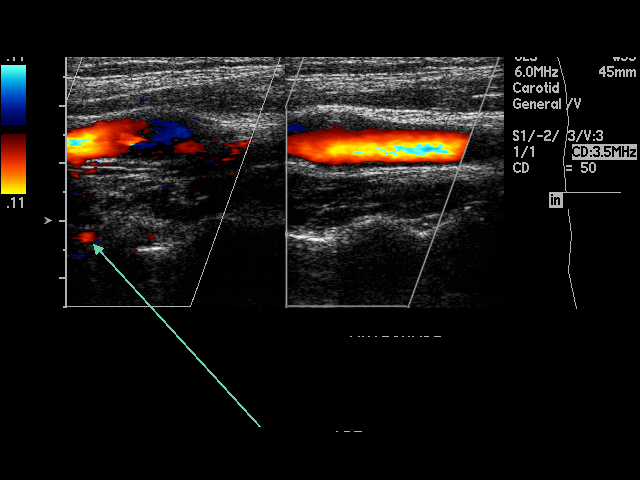

[17 of 24 positions shown; findings below may reference images not displayed]

PROCEDURE:     US  - US CAROTID DOPPLER BILATERAL  - February 19, 2012 [DATE]

RESULT:     There is mild to moderate soft plaque formation involving the
distal right common carotid. On the left, there is noted a small amount of
mixed, soft and calcific plaque formation about the carotid bifurcation.

On the right, the peak right common carotid artery flow velocity measures
1.036 meters per second and the peak right internal carotid artery flow
velocity measures 0.971 meters per second. The ICA/CCA ratio is 0.937. On
the left, the peak left common carotid artery flow velocity measures
meters per second and the peak left internal carotid artery flow velocity
measures 0.856 meters per second. The ICA/CCA ratio is 0.857. These values
bilaterally are in the normal range and are consistent with the bilateral
absence of hemodynamically significant stenosis.

There is observed antegrade flow in both vertebrals.
IMPRESSION: 1. There is noted moderate soft plaque formation in the distal right common
carotid. Flow velocities, however, do not indicate the presence of
hemodynamically significant stenosis.
2. There is a small amount of mixed, soft and calcific plaque formation
about the carotid bifurcation on the left but no hemodynamically significant
stenosis is identified on that side.
3. There is antegrade flow in both vertebrals.

## 2012-12-16 IMAGING — CR DG CHEST 1V PORT
1 series · 1 of 1 positions shown · non-contrast
Comparison: none

REASON FOR EXAM: left chest pain
COMMENTS:

PROCEDURE:     DXR - DXR PORTABLE CHEST SINGLE VIEW  - February 19, 2012  [DATE]
RESULT:     Comparison is made to the prior exam 02/04/2012. The lung fields
are clear. The heart, mediastinal and osseous structures show no significant
abnormalities. Monitoring electrodes are present.

[portable]
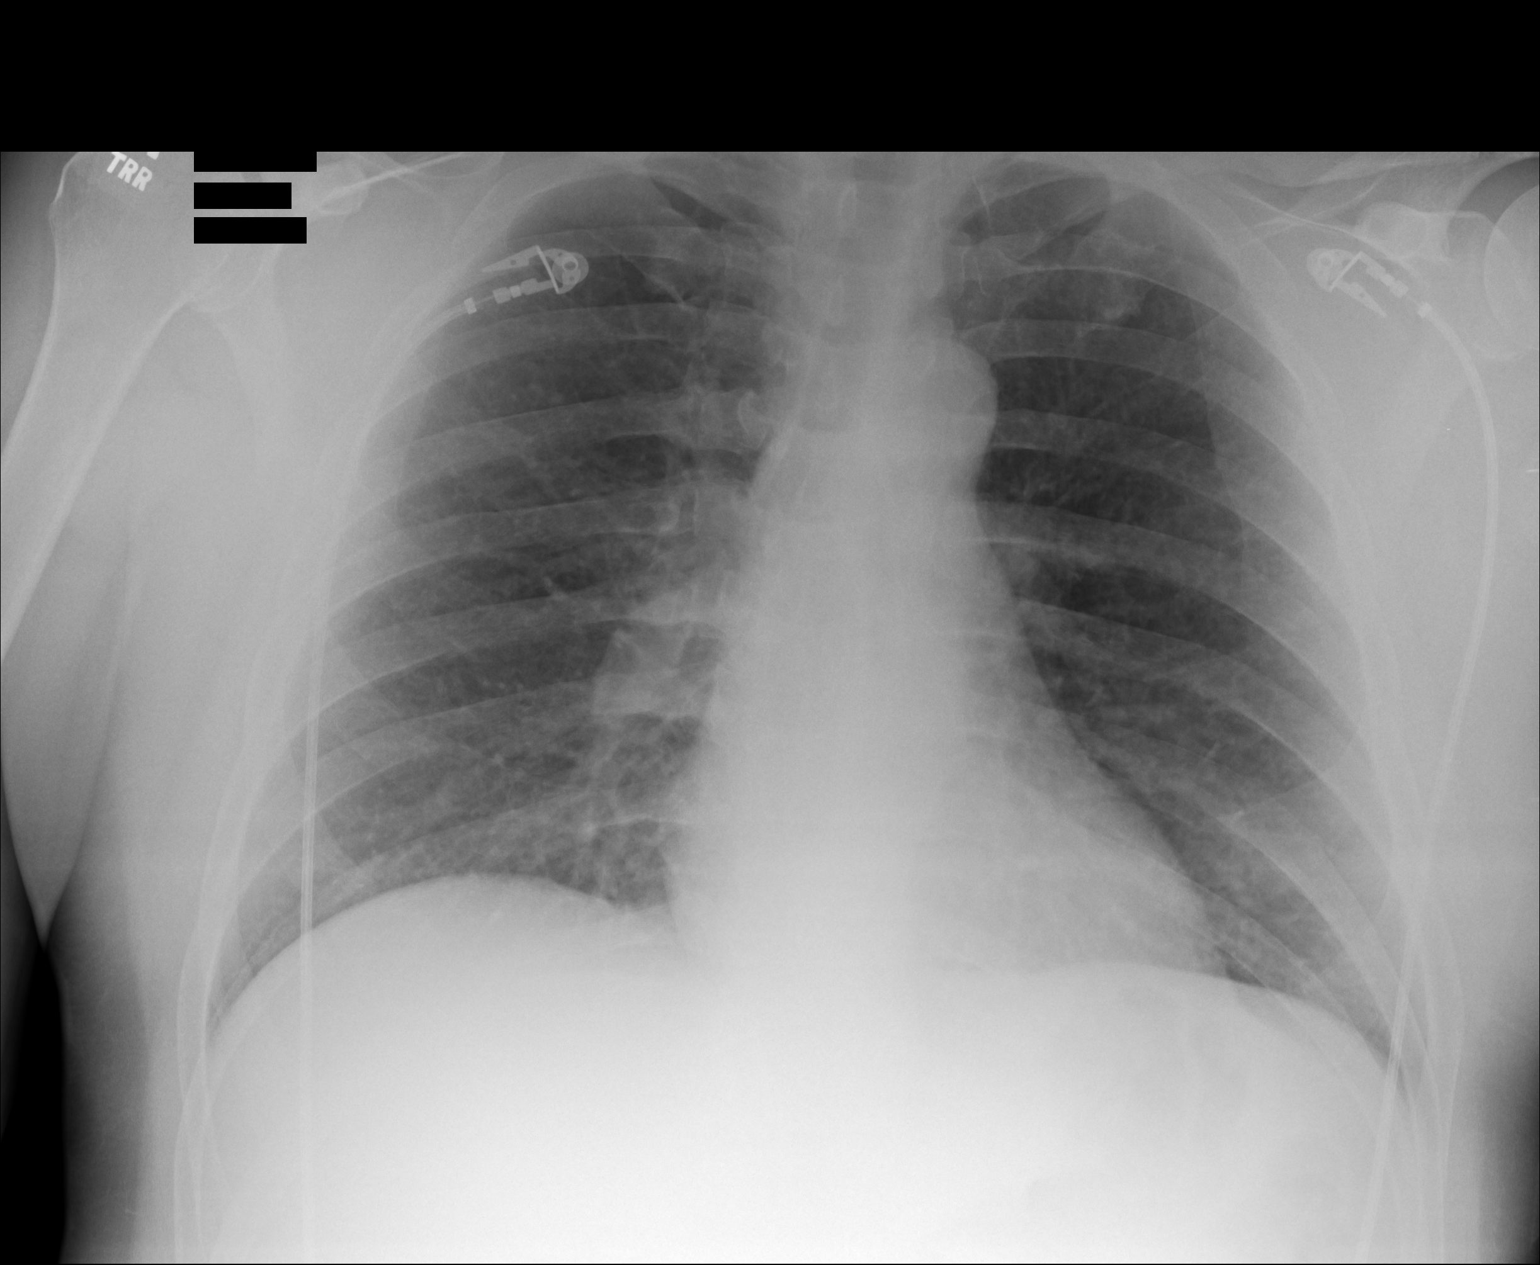

[1 of 1 positions shown; findings below may reference images not displayed]

IMPRESSION: No acute changes are identified.

## 2012-12-16 IMAGING — CT CT ABD-PELV W/O CM
1 of 2 series · 15 of 32 positions shown, 19 images · non-contrast
Comparison: none

REASON FOR EXAM: (1) L SIDED PAIN; (2) SAME
COMMENTS:

[Series 2: 3mm soft tissue · axial · 0.84mm/px · z∈[-1056,-570]mm · 15 of 178 slices shown, 19 images]
[im 8/178  soft-tissue]
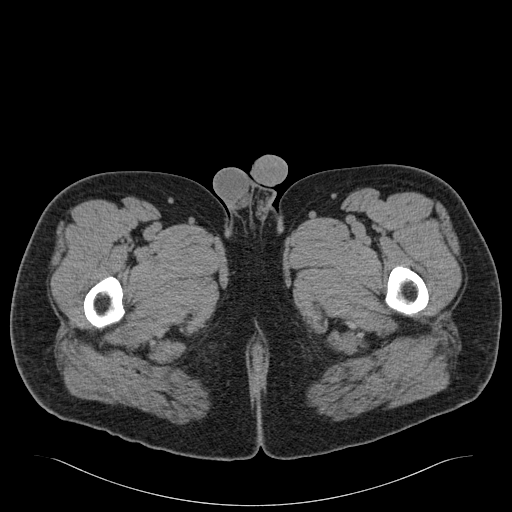
[im 8/178  bone]
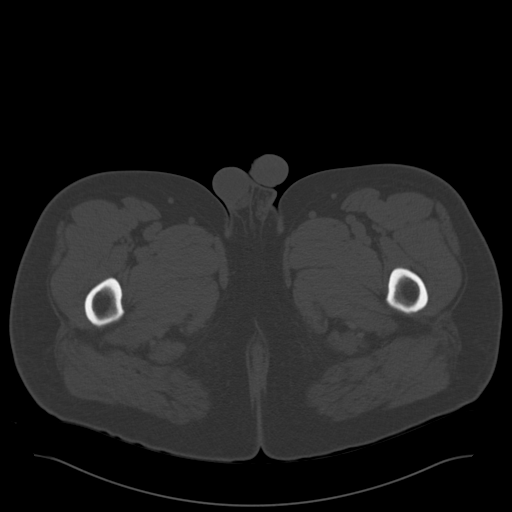
[im 22/178  soft-tissue]
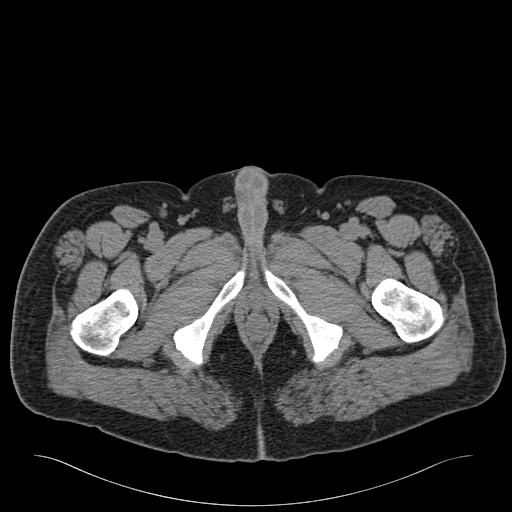
[im 36/178  soft-tissue]
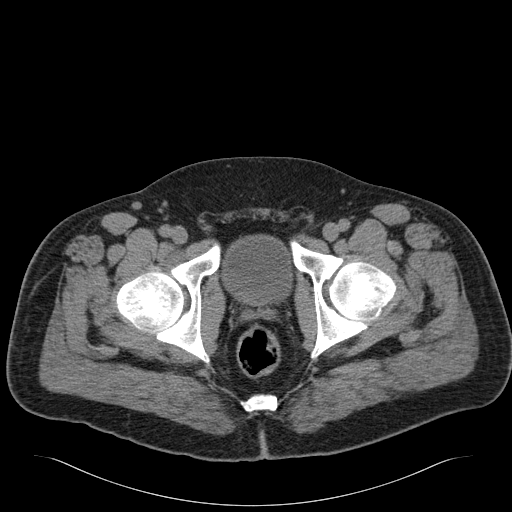
[im 50/178  soft-tissue]
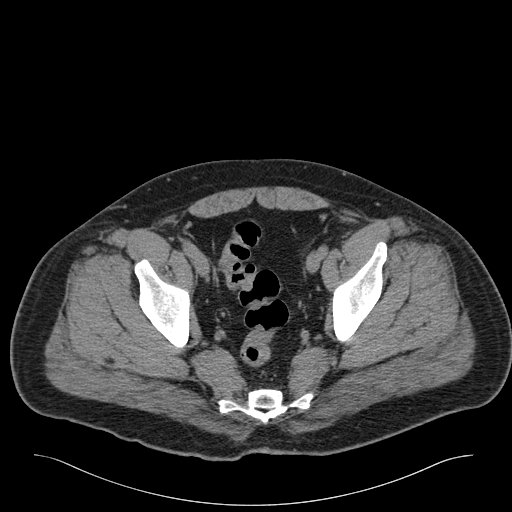
[im 64/178  soft-tissue]
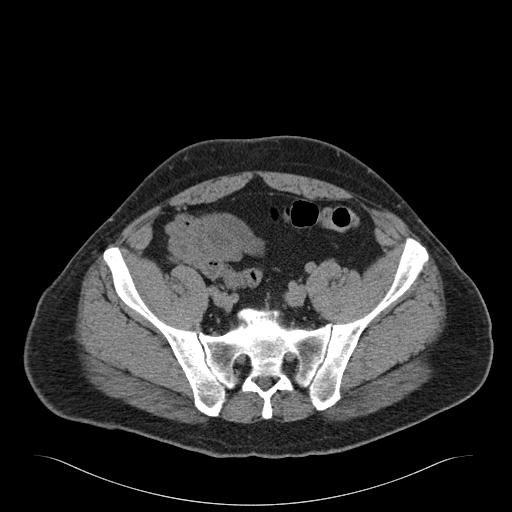
[im 78/178  soft-tissue]
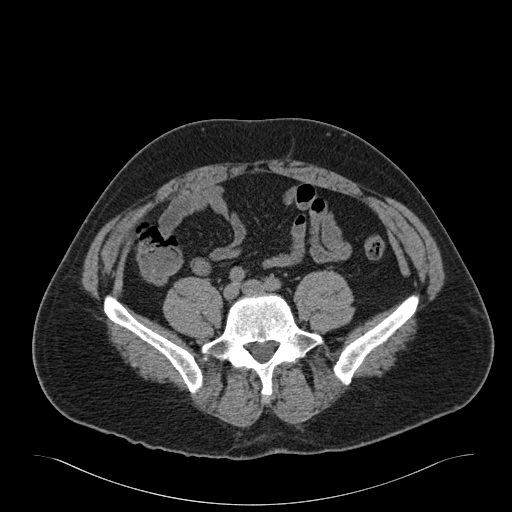
[im 93/178  soft-tissue]
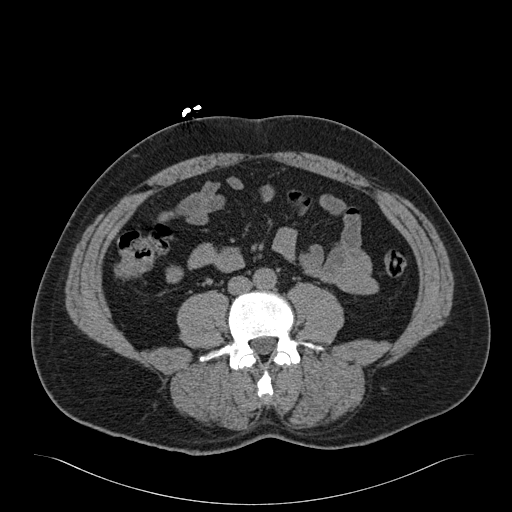
[im 100/178  soft-tissue]
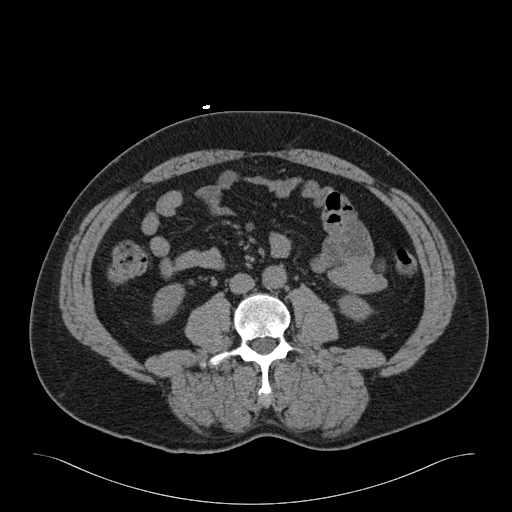
[im 114/178  soft-tissue]
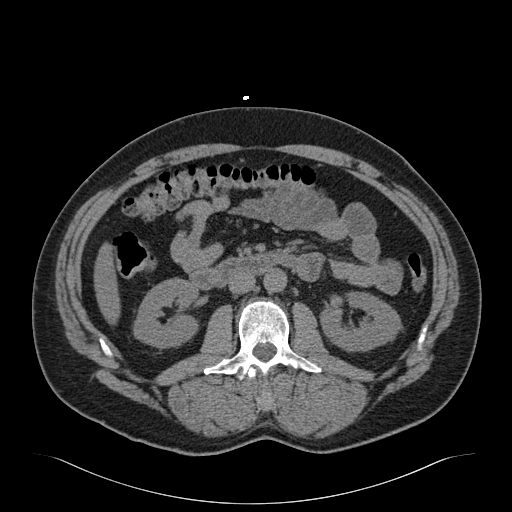
[im 114/178  bone]
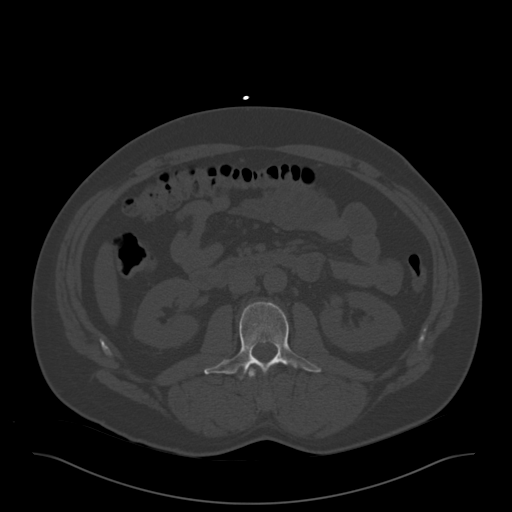
[im 128/178  soft-tissue]
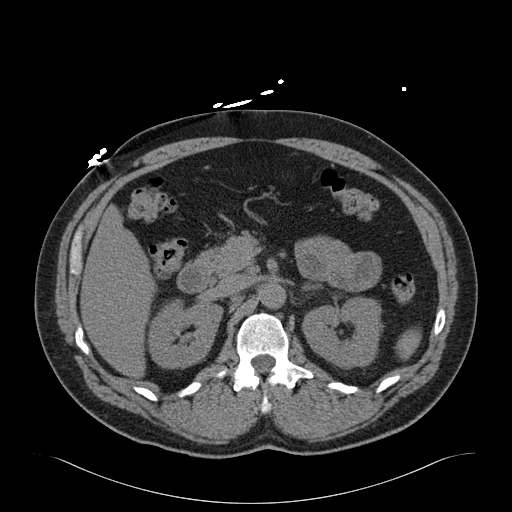
[im 142/178  soft-tissue]
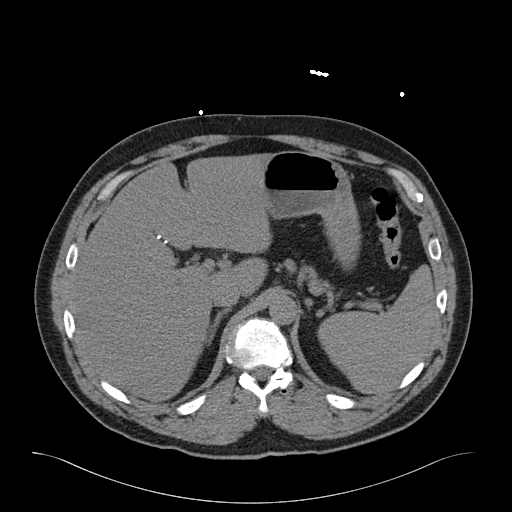
[im 149/178  lung]
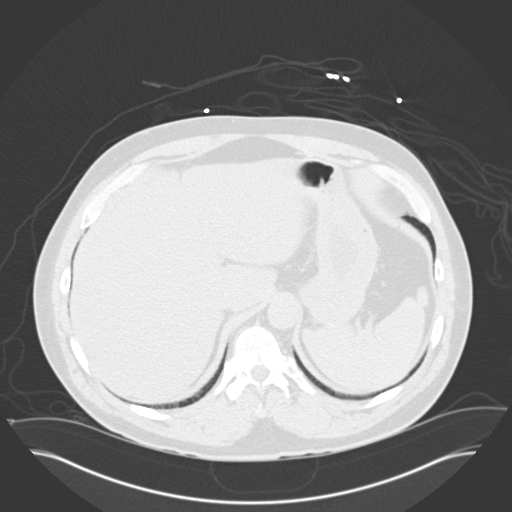
[im 156/178  soft-tissue]
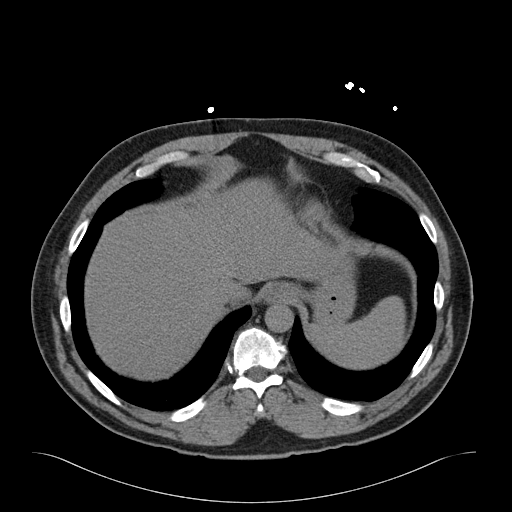
[im 156/178  lung]
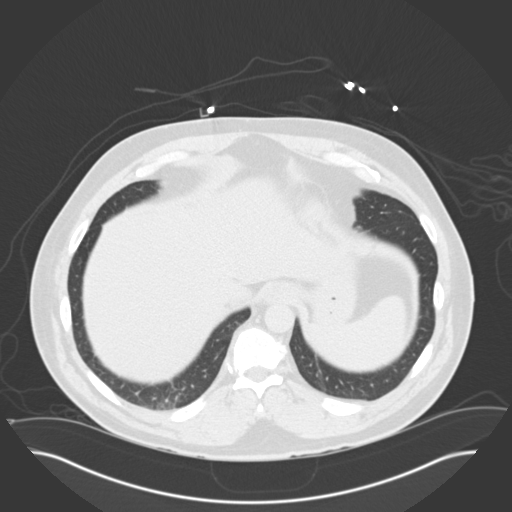
[im 163/178  lung]
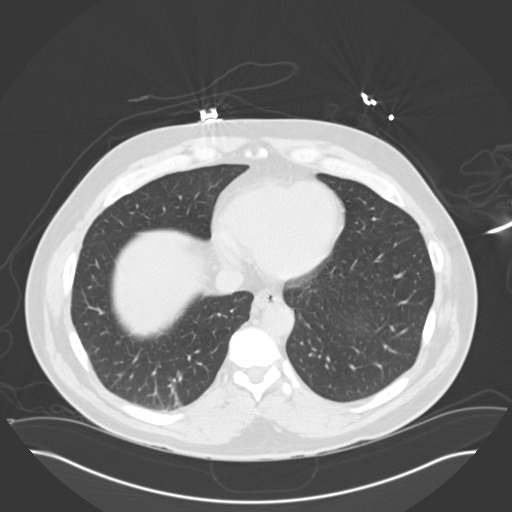
[im 170/178  soft-tissue]
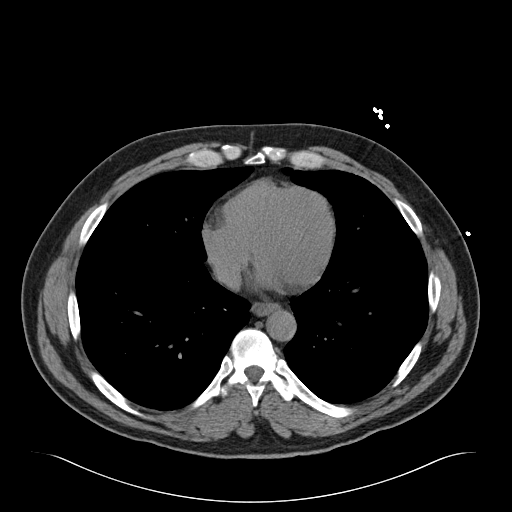
[im 170/178  lung]
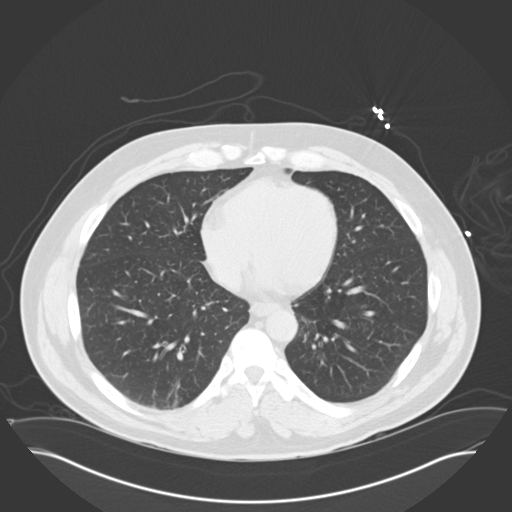

[15 of 32 positions shown; findings below may reference images not displayed]

PROCEDURE:     CT  - CT ABDOMEN AND PELVIS W[DATE] [DATE]

RESULT:     Axial noncontrast CT scanning was performed through the abdomen
and pelvis with reconstructions at 5 mm intervals and slice thicknesses.
Comparison is made to study September 23, 2011. Review of multiplanar
reconstructed images was performed separately on the VIA monitor.

The kidneys exhibit normal density and contour with no evidence of stones or
obstruction. The perinephric fat is normal in density. The gallbladder is
surgically absent. The liver, pancreas, spleen, nondistended stomach, and
adrenal glands are normal in appearance. The caliber of the abdominal aorta
is normal. There are stable mildly enlarged lymph nodes anterior to the
inferior vena cava just inferior to the caudate lobe of the liver.

The unopacified loops of small and large bowel exhibit no evidence of ileus
nor obstruction nor acute inflammation. The psoas musculature appears normal
for the noncontrast study. The partially distended urinary bladder is normal
in appearance. The prostate gland is mildly enlarged and produces an
impression upon the urinary bladder base. There is no evidence of inguinal
hernia. There is a tiny umbilical hernia containing fat.

The lung bases are clear. The lumbar vertebral bodies are preserved in
height. There is degenerative disc change of the lumbar spine.
IMPRESSION: 1. I do not see evidence of urinary tract stones or obstruction nor other
acute urinary tract abnormality.
2. I see no acute small or large bowel abnormality.
3. There is no evidence of acute hepatobiliary abnormality. The gallbladder
is surgically absent.
4. There are stable mildly enlarged lymph nodes inferior to the caudate lobe
of the liver anterior to the inferior vena cava.
5. I do not see evidence of a psoas abscess or evidence of a retroperitoneal
mass.

## 2012-12-22 DIAGNOSIS — I639 Cerebral infarction, unspecified: Secondary | ICD-10-CM

## 2012-12-22 HISTORY — DX: Cerebral infarction, unspecified: I63.9

## 2012-12-23 ENCOUNTER — Emergency Department: Payer: Self-pay | Admitting: Emergency Medicine

## 2012-12-23 LAB — CBC
HCT: 54.1 % — ABNORMAL HIGH (ref 40.0–52.0)
HGB: 18.1 g/dL — ABNORMAL HIGH (ref 13.0–18.0)
MCH: 29.1 pg (ref 26.0–34.0)
MCV: 87 fL (ref 80–100)
RBC: 6.23 10*6/uL — ABNORMAL HIGH (ref 4.40–5.90)
WBC: 18.6 10*3/uL — ABNORMAL HIGH (ref 3.8–10.6)

## 2012-12-23 LAB — COMPREHENSIVE METABOLIC PANEL
Albumin: 4.1 g/dL (ref 3.4–5.0)
Anion Gap: 11 (ref 7–16)
BUN: 15 mg/dL (ref 7–18)
EGFR (Non-African Amer.): >60
Glucose: 96 mg/dL (ref 65–99)
Osmolality: 271 (ref 275–301)
Potassium: 4.1 mmol/L (ref 3.5–5.1)
SGPT (ALT): 46 U/L (ref 12–78)
Sodium: 135 mmol/L — ABNORMAL LOW (ref 136–145)
Total Protein: 8.1 g/dL (ref 6.4–8.2)

## 2012-12-23 LAB — TSH: Thyroid Stimulating Horm: 2.63 u[IU]/mL

## 2012-12-23 LAB — SALICYLATE LEVEL: Salicylates, Serum: 1.9 mg/dL

## 2012-12-24 LAB — DRUG SCREEN, URINE
Amphetamines, Ur Screen: NEGATIVE (ref ?–1000)
Barbiturates, Ur Screen: NEGATIVE (ref ?–200)
Benzodiazepine, Ur Scrn: NEGATIVE (ref ?–200)
Cocaine Metabolite,Ur ~~LOC~~: POSITIVE (ref ?–300)
MDMA (Ecstasy)Ur Screen: NEGATIVE (ref ?–500)
Methadone, Ur Screen: NEGATIVE (ref ?–300)
Opiate, Ur Screen: NEGATIVE (ref ?–300)
Tricyclic, Ur Screen: NEGATIVE (ref ?–1000)

## 2012-12-24 LAB — URINALYSIS, COMPLETE
Bilirubin,UR: NEGATIVE
Blood: NEGATIVE
Glucose,UR: NEGATIVE mg/dL (ref 0–75)
Ketone: NEGATIVE
Leukocyte Esterase: NEGATIVE
Nitrite: NEGATIVE
Ph: 5 (ref 4.5–8.0)
RBC,UR: <1 /HPF (ref 0–5)
Specific Gravity: 1.009 (ref 1.003–1.030)
Squamous Epithelial: <1
WBC UR: <1 /HPF (ref 0–5)

## 2013-02-14 IMAGING — CT CT CHEST W/ CM
1 of 2 series · 16 of 30 positions shown, 20 images · non-contrast
Comparison: none

REASON FOR EXAM: syncope and cp
COMMENTS:

PROCEDURE:     CT  - CT CHEST (FOR PE) W  - April 19, 2012  [DATE]
RESULT:     History: Syncope and chest pain.
Comparison Study: Chest CT 09/22/2001.

[Series 4: soft tissue · axial · 0.82mm/px · z∈[-644,-354]mm · 16 of 111 slices shown, 20 images]
[im 7/111  mediastinal]
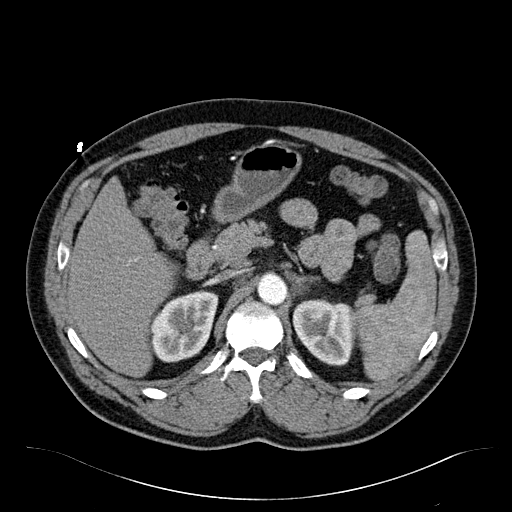
[im 7/111  lung]
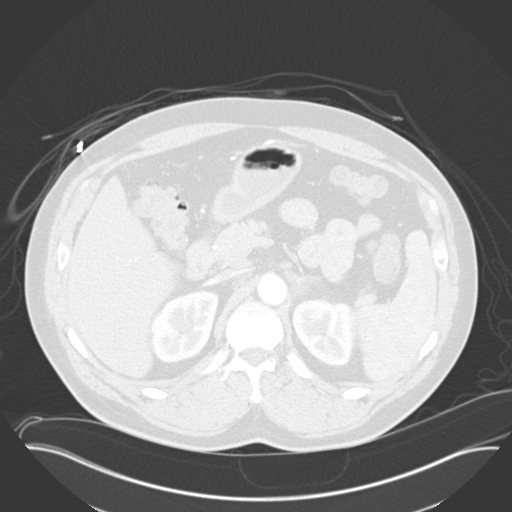
[im 13/111  lung]
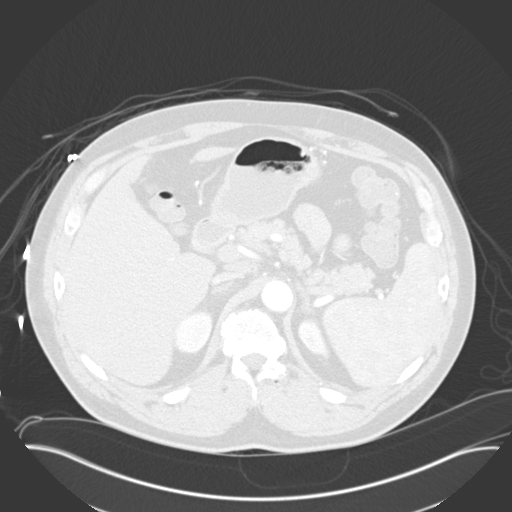
[im 19/111  lung]
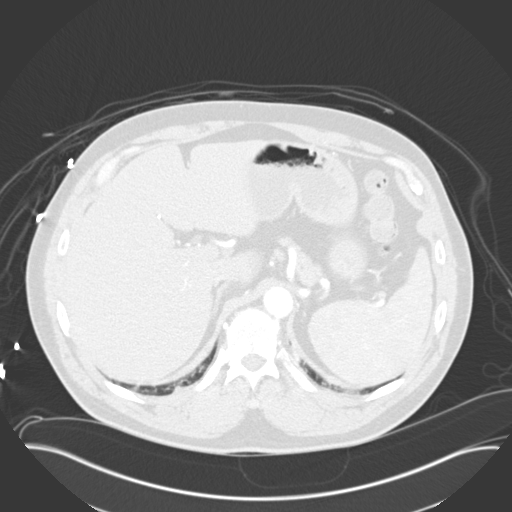
[im 25/111  lung]
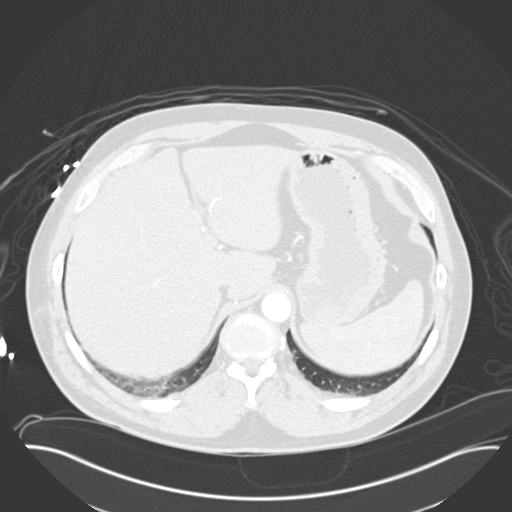
[im 37/111  mediastinal]
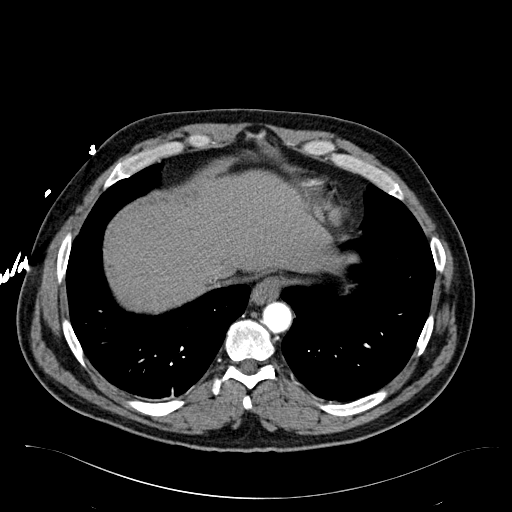
[im 37/111  lung]
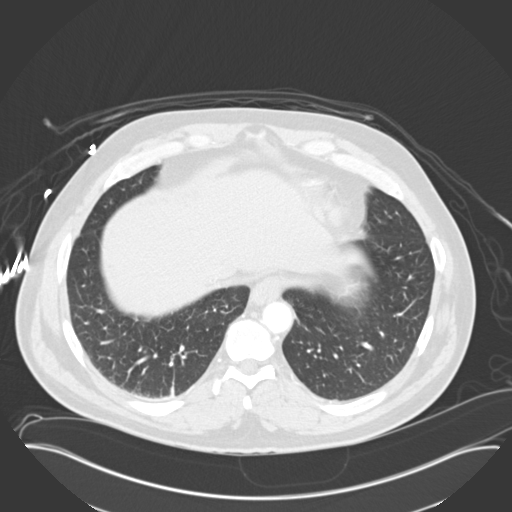
[im 43/111  lung]
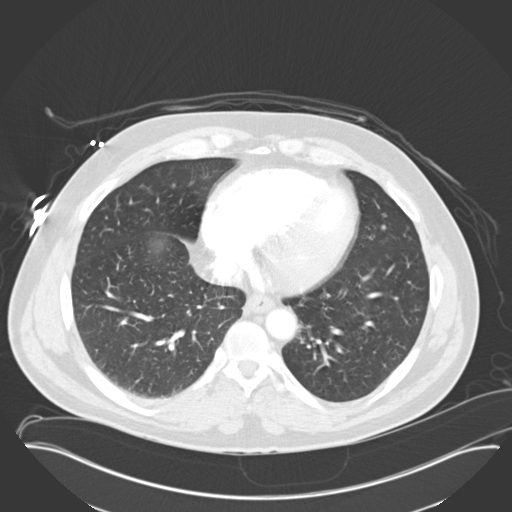
[im 49/111  lung]
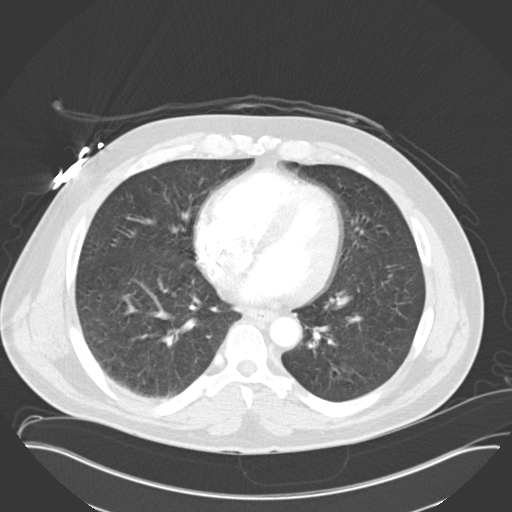
[im 53/111  lung]
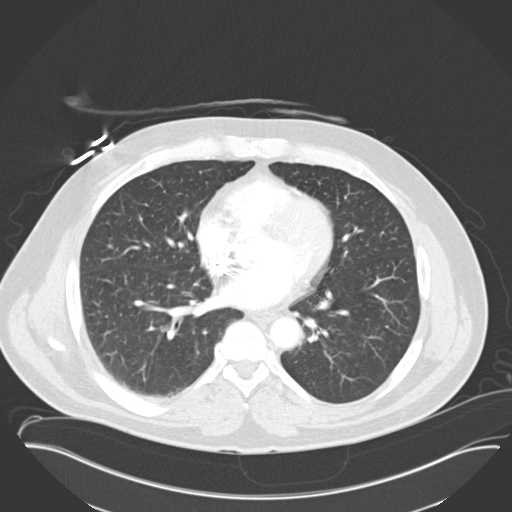
[im 56/111  mediastinal]
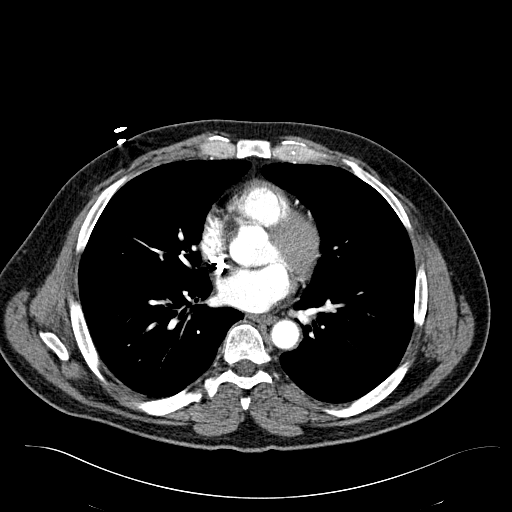
[im 56/111  lung]
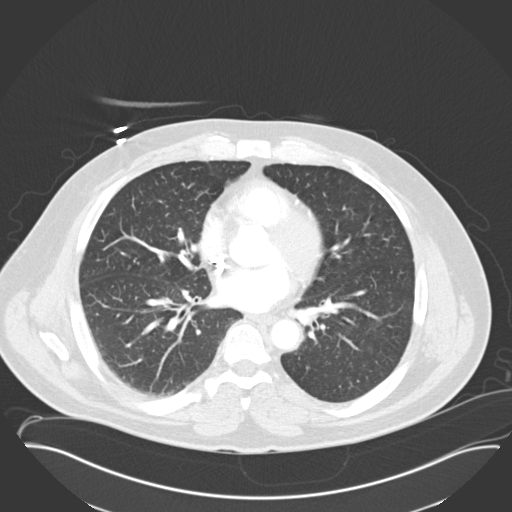
[im 62/111  lung]
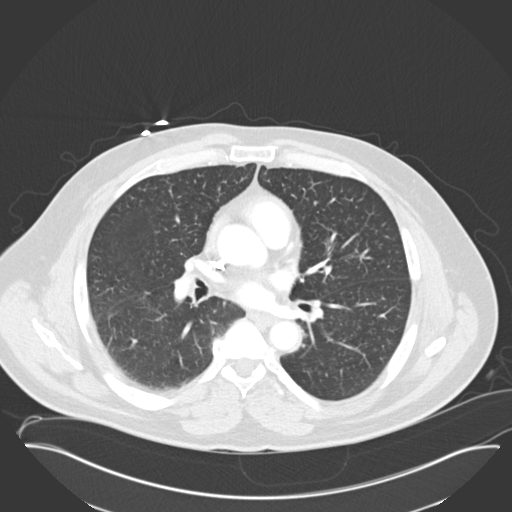
[im 68/111  lung]
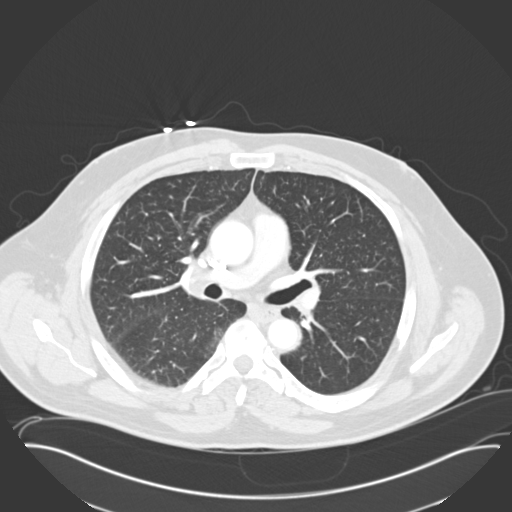
[im 74/111  lung]
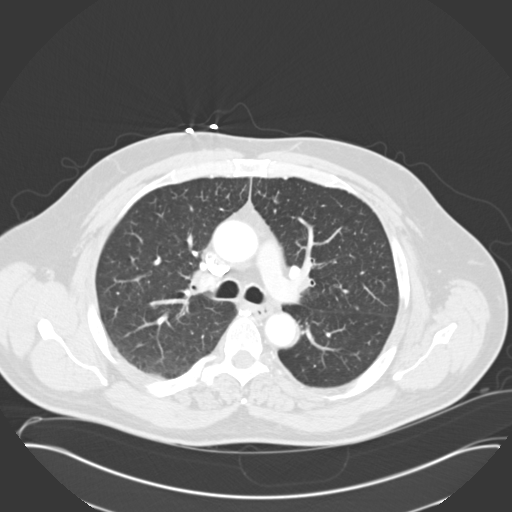
[im 86/111  mediastinal]
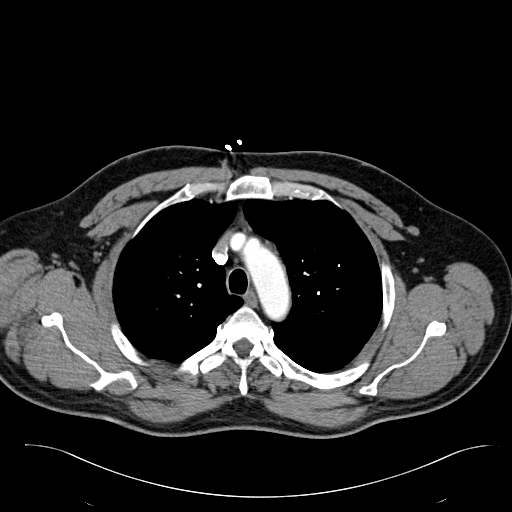
[im 86/111  lung]
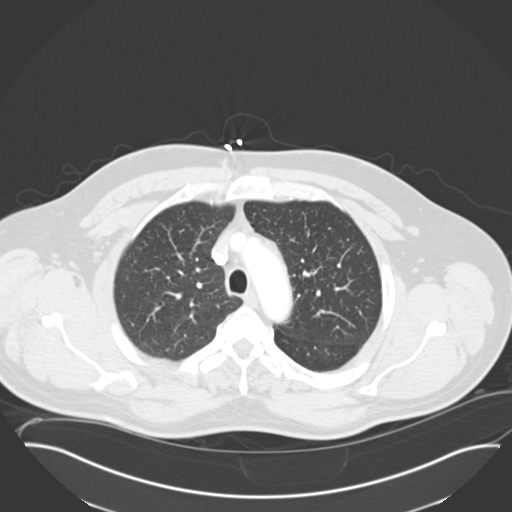
[im 92/111  lung]
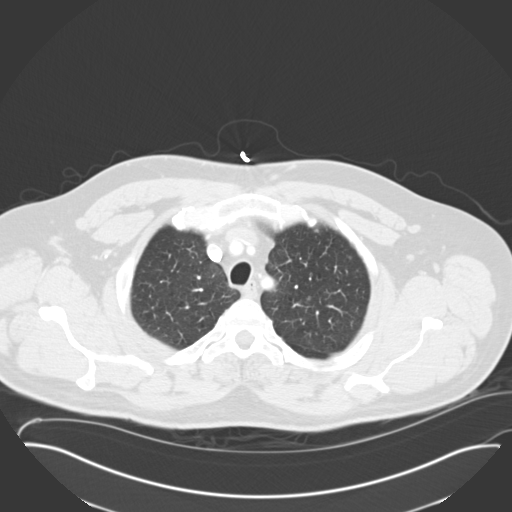
[im 98/111  lung]
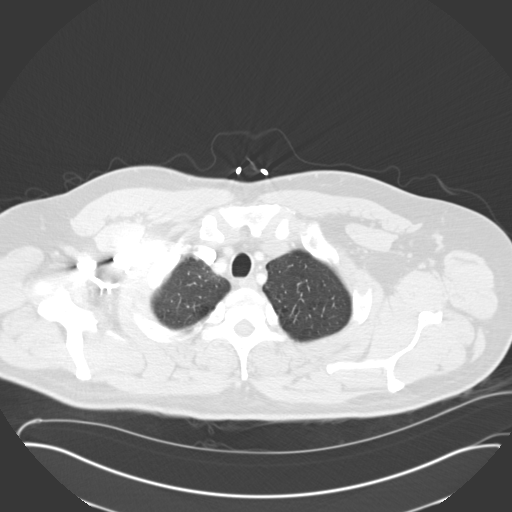
[im 104/111  lung]
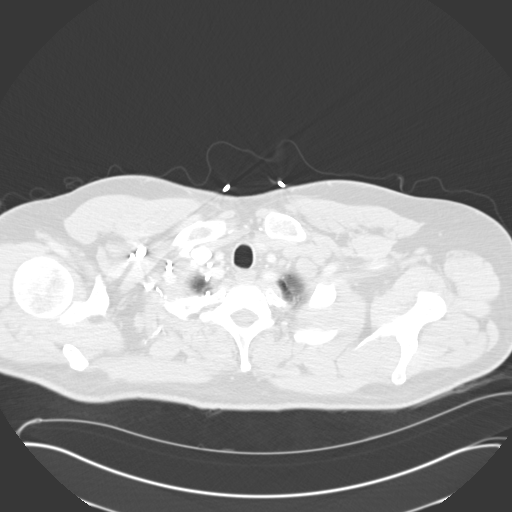

[16 of 30 positions shown; findings below may reference images not displayed]

FINDINGS: Standard CT obtained with 100 cc of [id number]. Thoracic aorta
nondistended. Coronary artery disease. Pulmonary arteries normal. Adrenals
normal. Bilateral hilar lymph nodes are noted. Large airways patent. Mild
atelectasis. No free air.
IMPRESSION: Bilateral hilar  adenopathy. No pulmonary embolus. Mild
base atelectasis. Small hiatal hernia again noted.

## 2013-02-14 IMAGING — CR DG LUMBAR SPINE 2-3V
1 series · 3 of 3 positions shown · non-contrast
Comparison: none

REASON FOR EXAM: low back pain
COMMENTS:

[Series 4: t lumbar spine ap · 0.14mm/px · 3 of 3 slices shown]
[im 1/3]
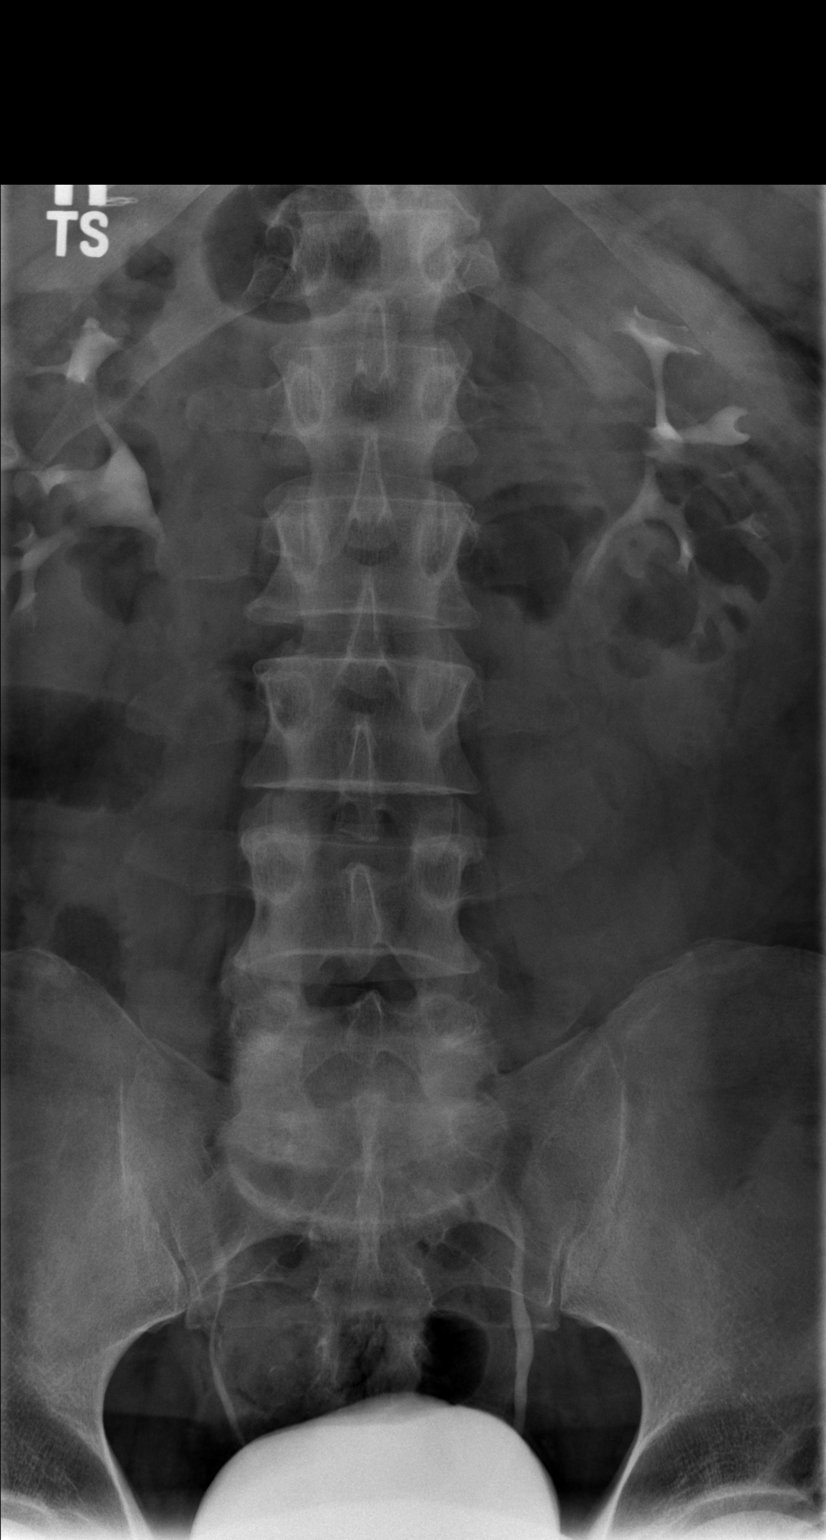
[im 2/3]
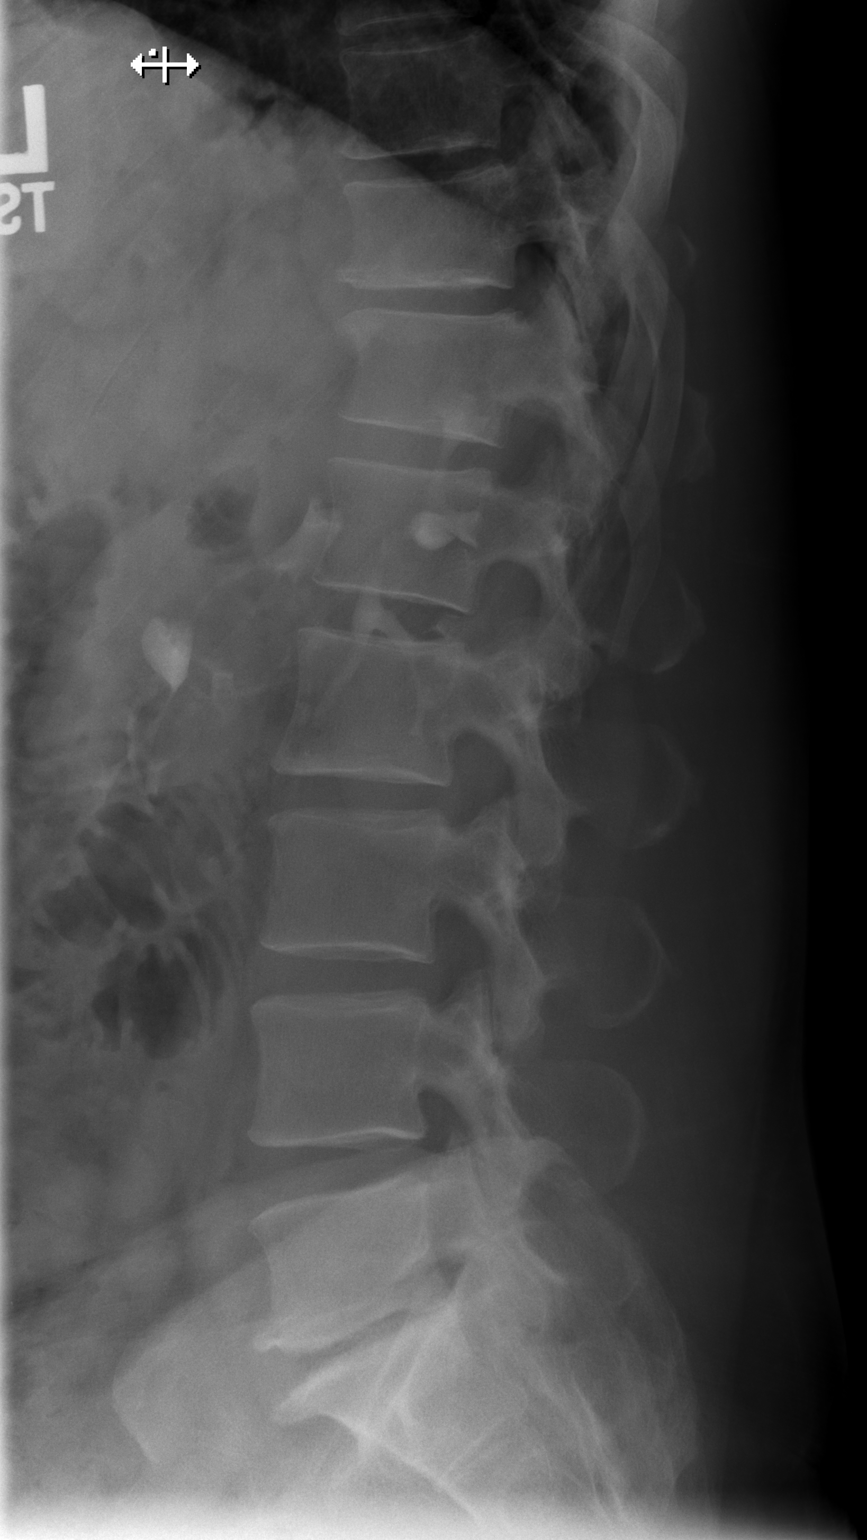
[im 3/3]
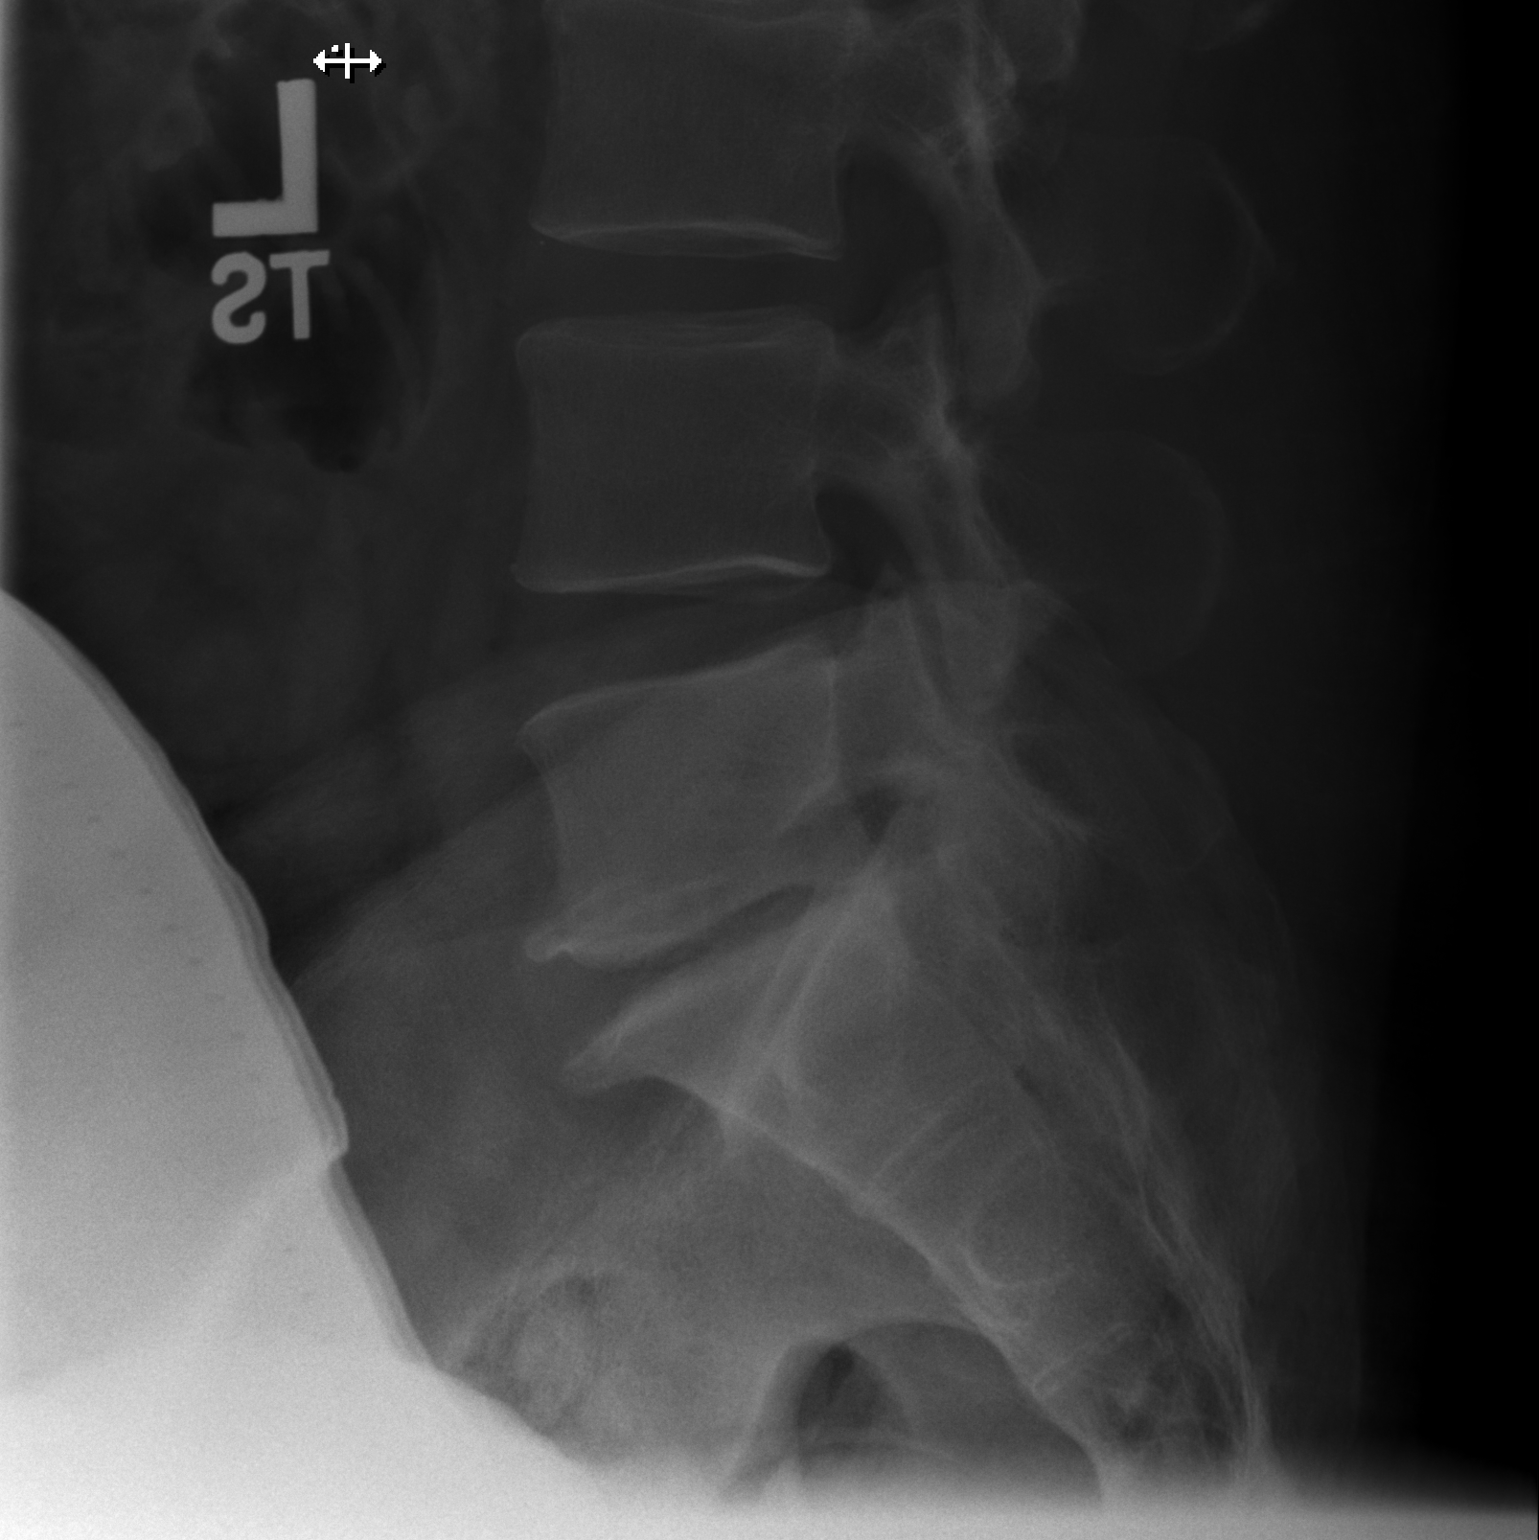

[3 of 3 positions shown; findings below may reference images not displayed]

PROCEDURE:     DXR - DXR LUMBAR SPINE AP AND LATERAL  - April 19, 2012  [DATE]

RESULT:     The vertebral body heights are well-maintained. There is
narrowing of the L5-S1 intervertebral disc space suspicious for disc
disease. The intervertebral disc spaces otherwise are well-maintained. The
pedicles are bilaterally intact. There is incidentally noted contrast
material in the renal collecting systems bilaterally and in the urinary
bladder consistent with residual contrast from prior CT.
IMPRESSION: 1. No fracture is identified.
2. There is narrowing of the L5-S1 intervertebral disc space suspicious for
disc disease.

## 2013-02-14 IMAGING — CR DG CHEST 2V
1 series · 3 of 3 positions shown · non-contrast
Comparison: none

REASON FOR EXAM: Chest Pain
COMMENTS:

PROCEDURE:     DXR - DXR CHEST PA (OR AP) AND LATERAL  - April 19, 2012 [DATE]
RESULT:     Comparison is made to the prior exam of 02/19/2012. The lung
fields are clear. The heart, mediastinal and osseous structures show no
acute changes.

[Series 1: x chest ap · 0.14mm/px · 3 of 3 slices shown]
[im 1/3]
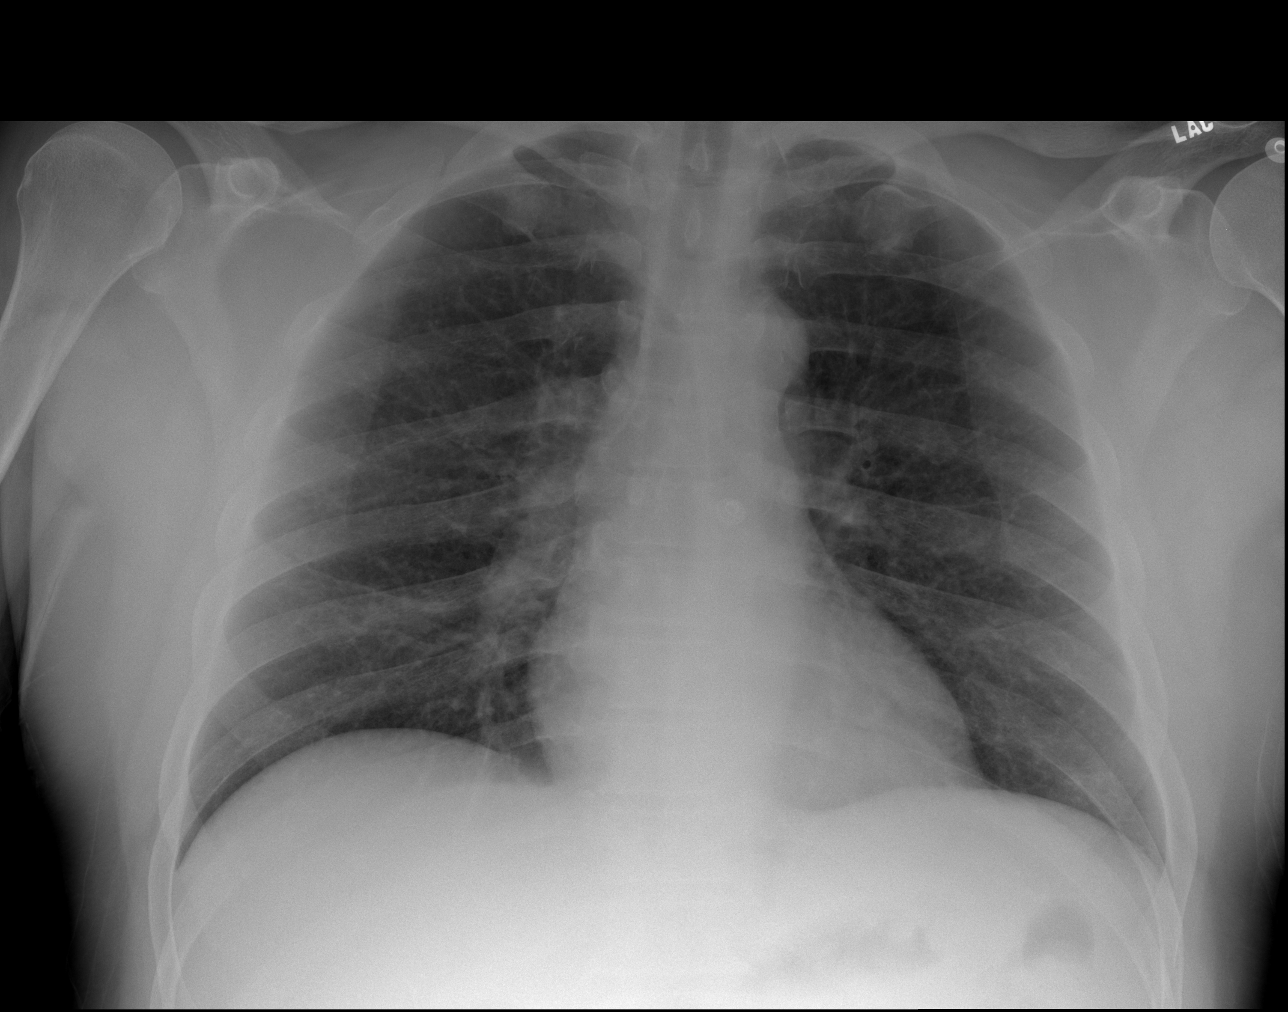
[im 2/3]
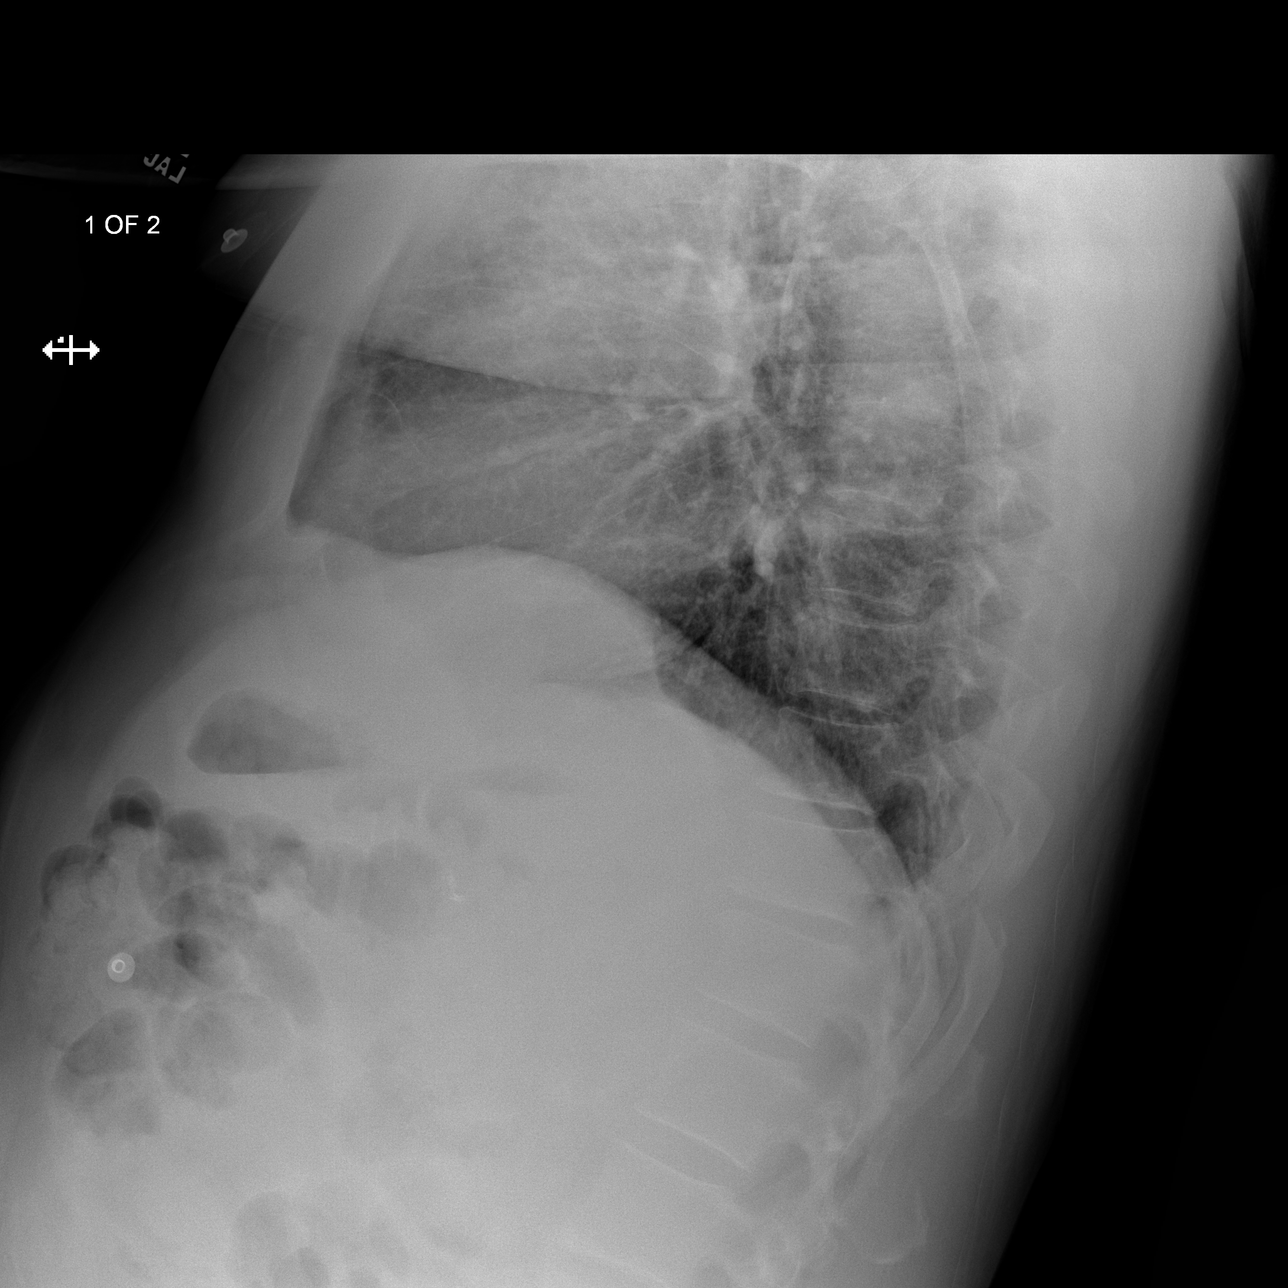
[im 3/3]
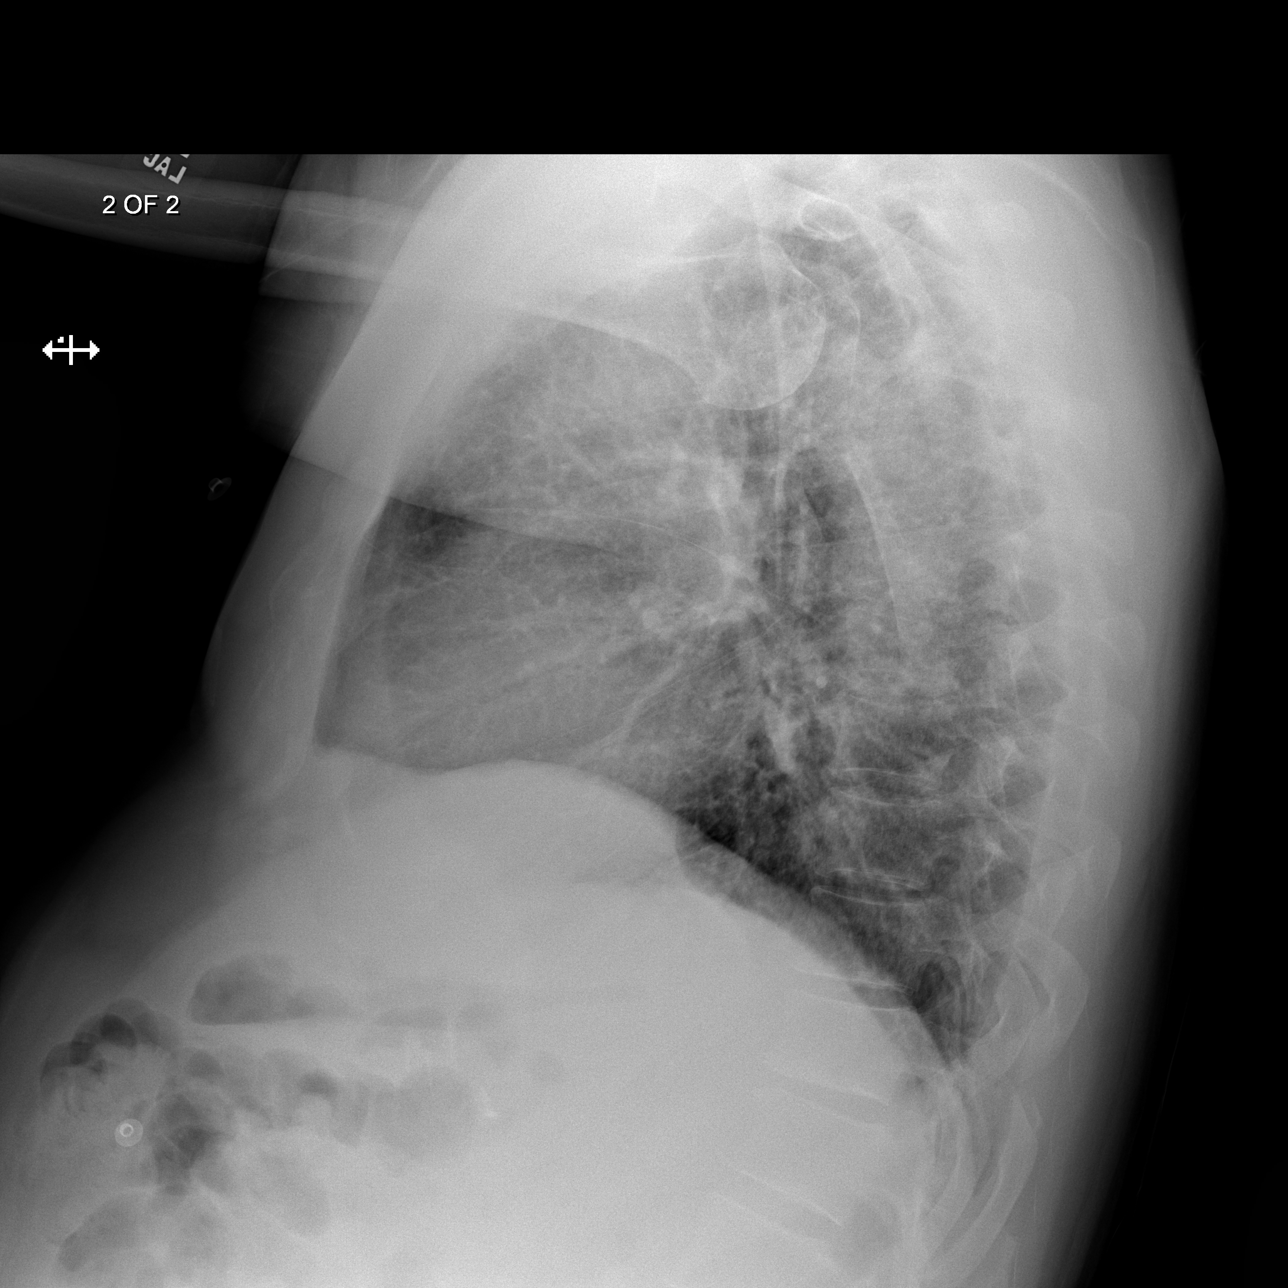

[3 of 3 positions shown; findings below may reference images not displayed]

IMPRESSION: 1.     No acute changes are identified.

## 2013-02-14 IMAGING — CT CT HEAD WITHOUT CONTRAST
2 series · 16 of 30 positions shown, 20 images · non-contrast
Comparison: none

REASON FOR EXAM: syncope with ams
COMMENTS:

PROCEDURE:     CT  - CT HEAD WITHOUT CONTRAST  - April 19, 2012  [DATE]
RESULT:     History: Syncope.
Comparison Study: Prior CT of 02/19/2012.

[Series 2: without · axial · non-contrast · 0.43mm/px · z∈[+50,+170]mm · 13 of 30 slices shown, 17 images]
[im 3/30  brain]
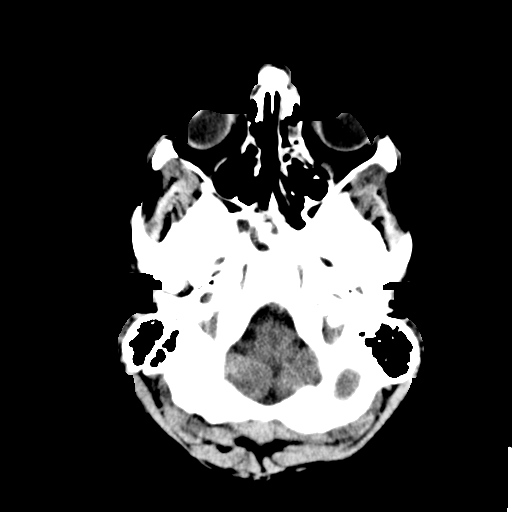
[im 3/30  bone]
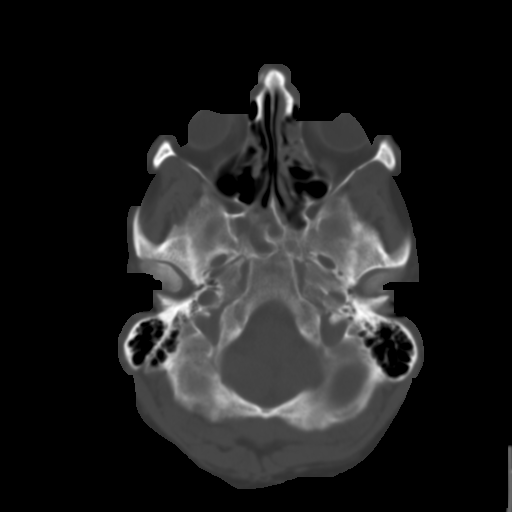
[im 5/30  brain]
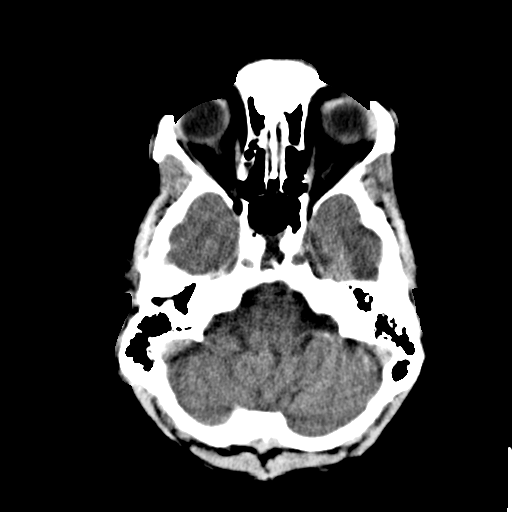
[im 7/30  brain]
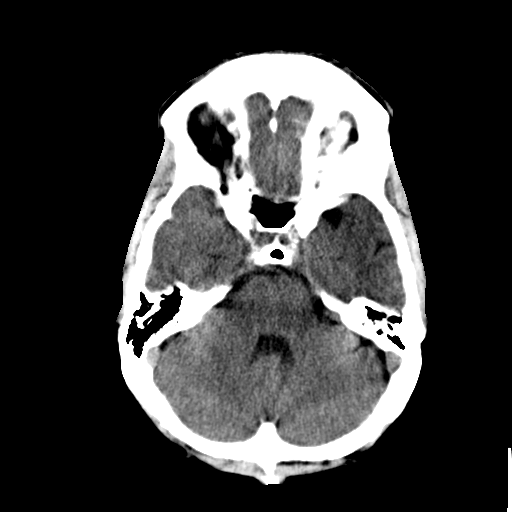
[im 9/30  brain]
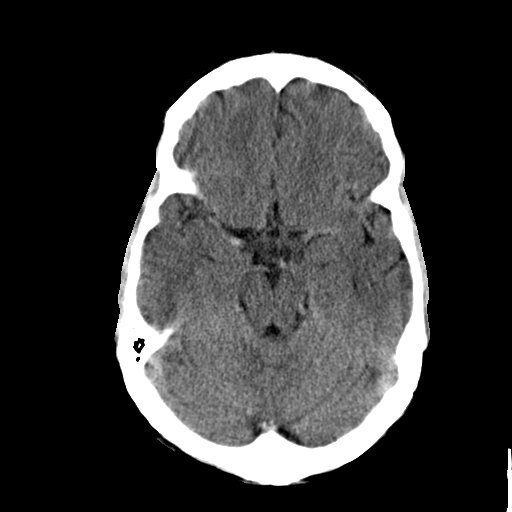
[im 11/30  brain]
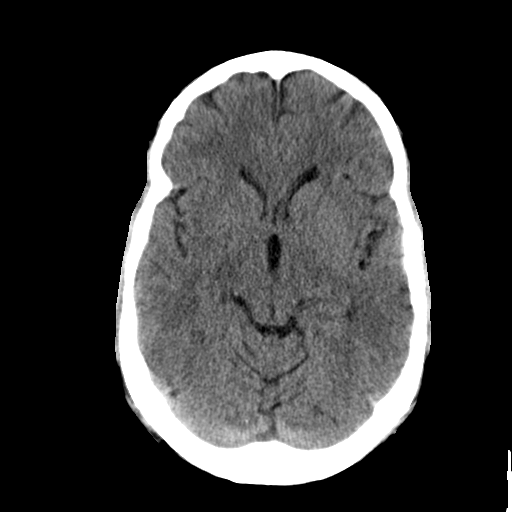
[im 11/30  bone]
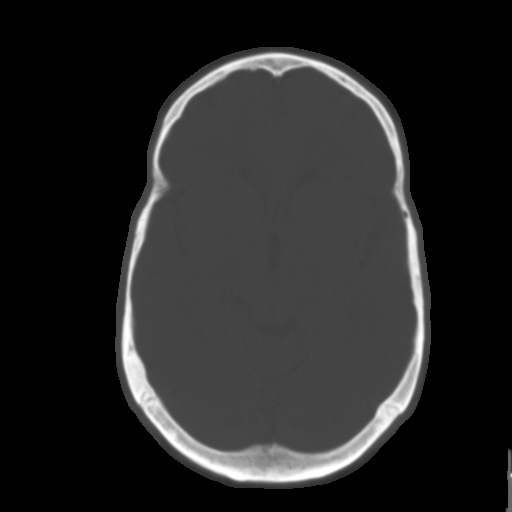
[im 13/30  brain]
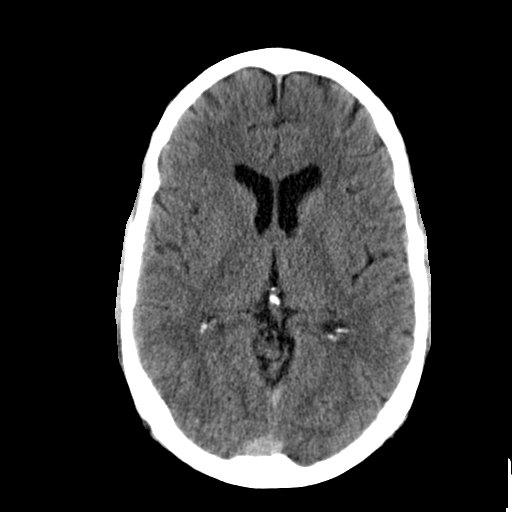
[im 15/30  brain]
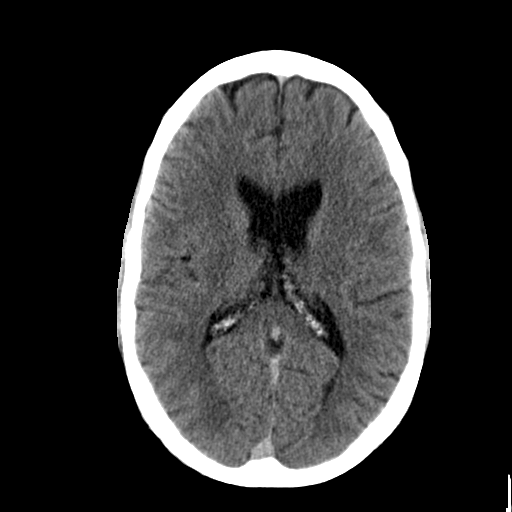
[im 17/30  brain]
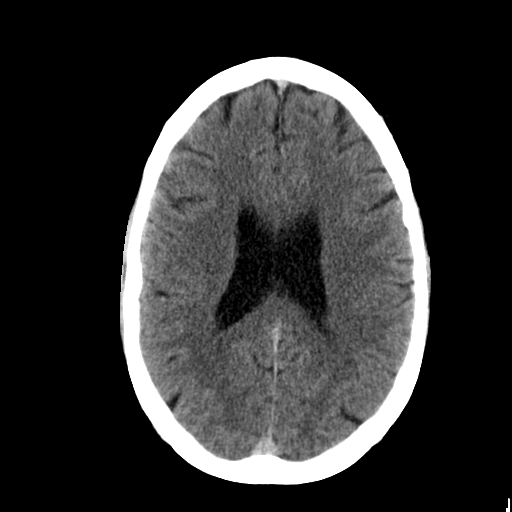
[im 19/30  brain]
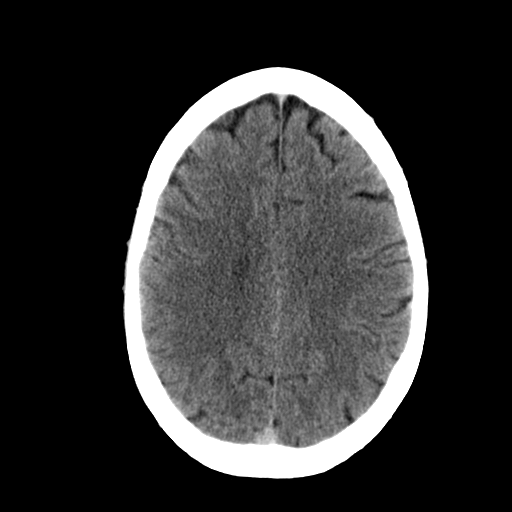
[im 19/30  bone]
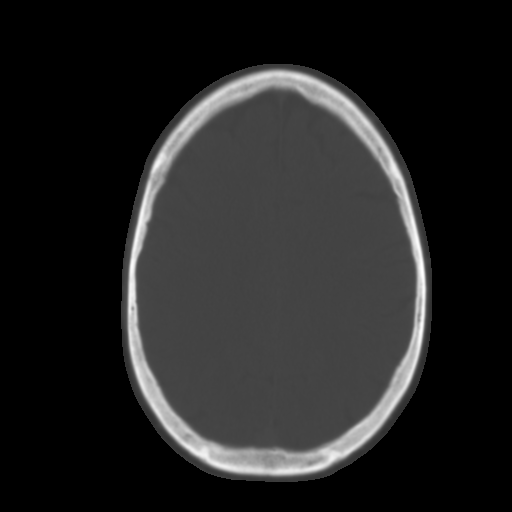
[im 21/30  brain]
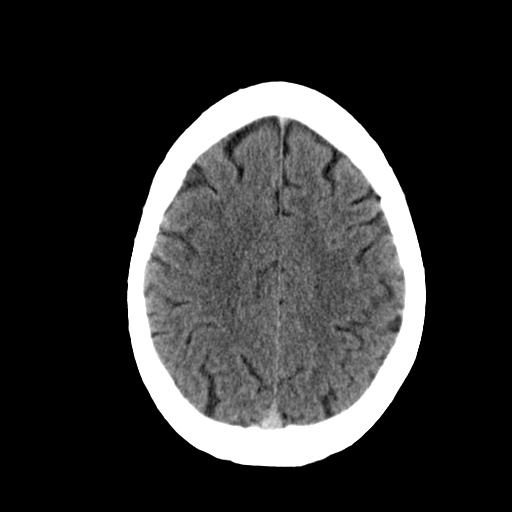
[im 23/30  brain]
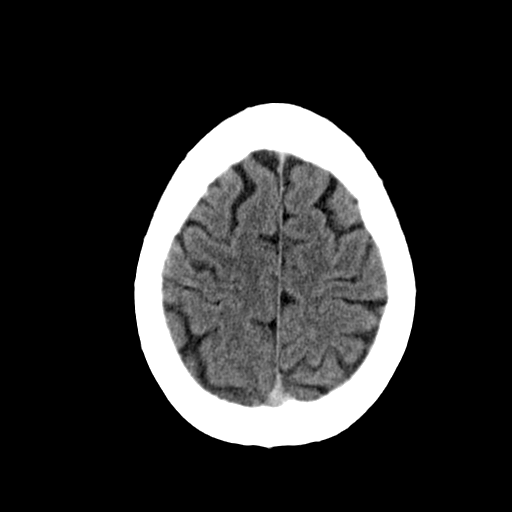
[im 25/30  brain]
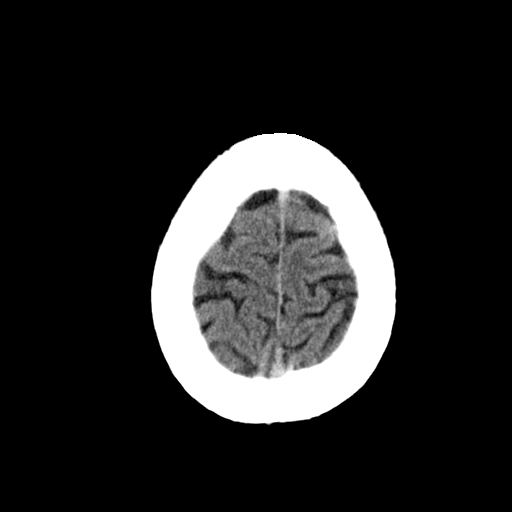
[im 27/30  brain]
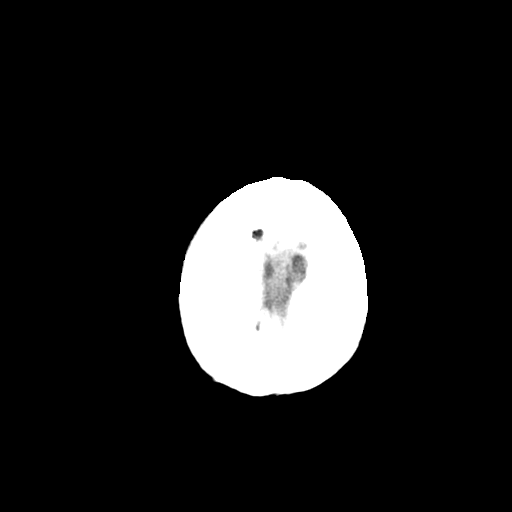
[im 27/30  bone]
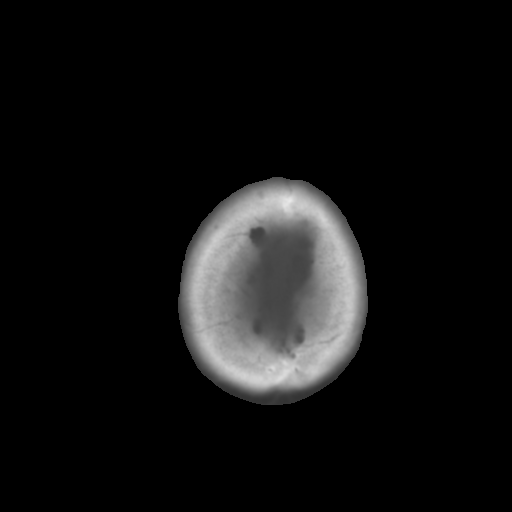

[Series 3: bone · axial · 0.43mm/px · z∈[+50,+90]mm · 3 of 30 slices shown]
[im 3/30  bone]
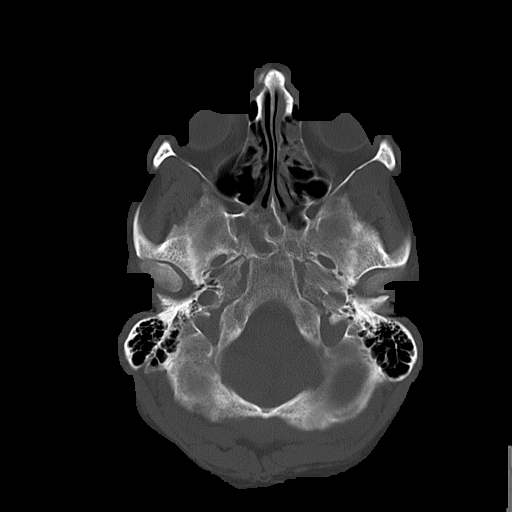
[im 7/30  bone]
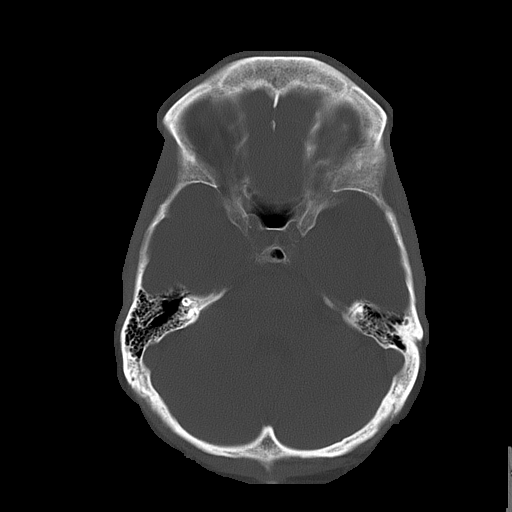
[im 11/30  bone]
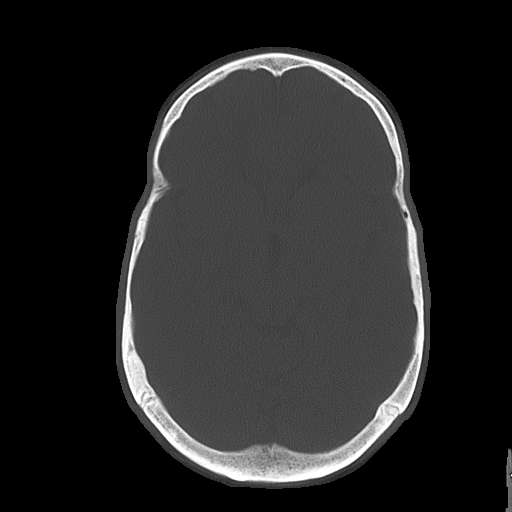

[16 of 30 positions shown; findings below may reference images not displayed]

FINDINGS: No mass. No hydrocephalus. No hemorrhage. No acute bony
abnormality.
IMPRESSION: No acute abnormality. Incidental note is made of mucosal
thickening in the ethmoidal and sphenoid sinuses consistent with sinusitis.

## 2013-03-02 IMAGING — CT CT HEAD WITHOUT CONTRAST
2 series · 16 of 30 positions shown, 20 images · non-contrast
Comparison: none

REASON FOR EXAM: +ETOH, fall with head trauma
COMMENTS:

PROCEDURE:     CT  - CT HEAD WITHOUT CONTRAST  - May 05, 2012 [DATE]
RESULT:     Comparison:  04/19/2012
TECHNIQUE: Multiple axial images from the foramen magnum to the vertex were
obtained without IV contrast.

[Series 2: without · axial · non-contrast · 0.44mm/px · z∈[+238,+374]mm · 13 of 33 slices shown, 17 images]
[im 3/33  brain]
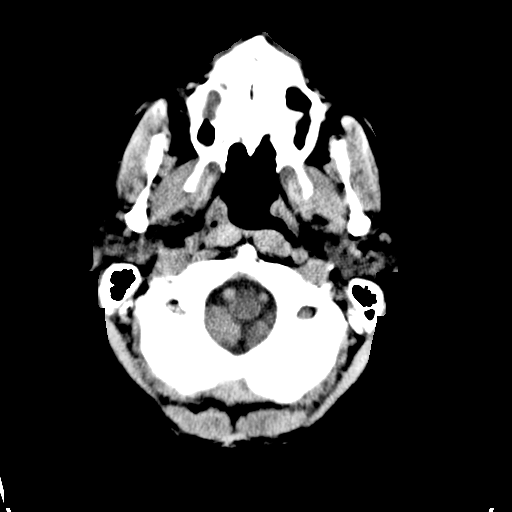
[im 3/33  bone]
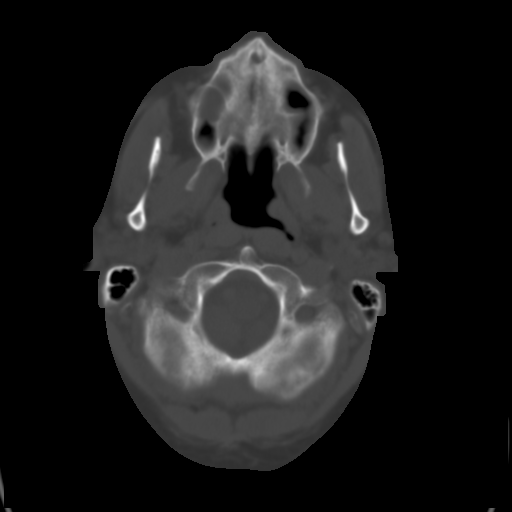
[im 5/33  brain]
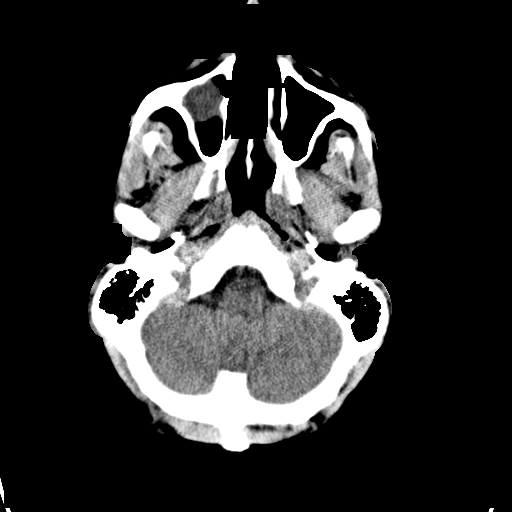
[im 7/33  brain]
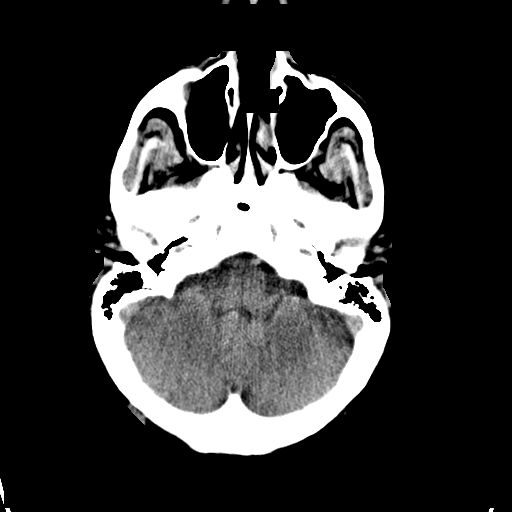
[im 10/33  brain]
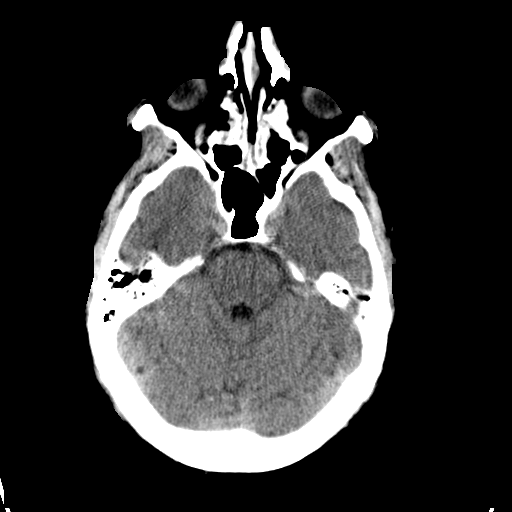
[im 12/33  brain]
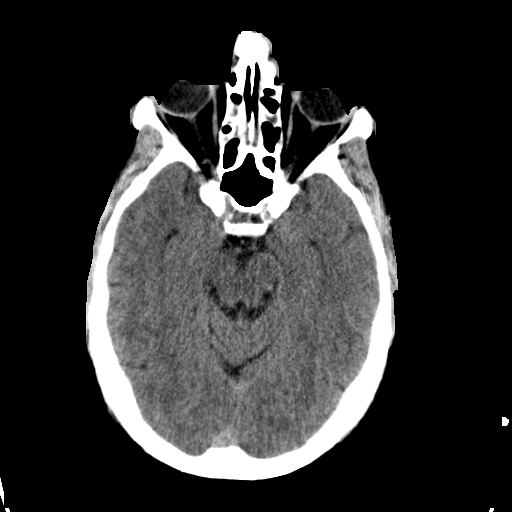
[im 12/33  bone]
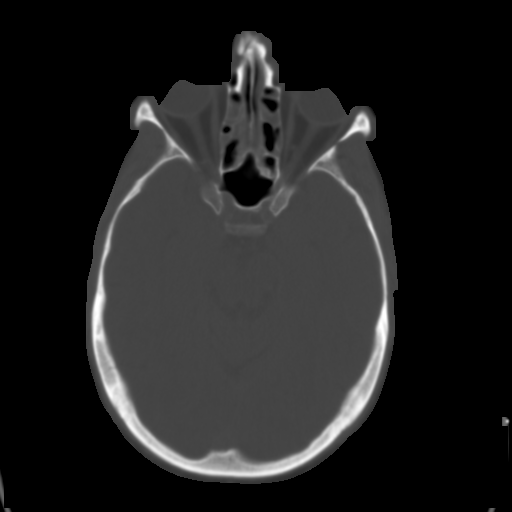
[im 14/33  brain]
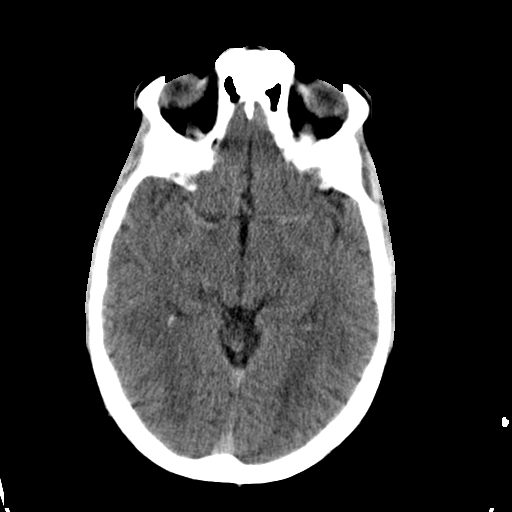
[im 17/33  brain]
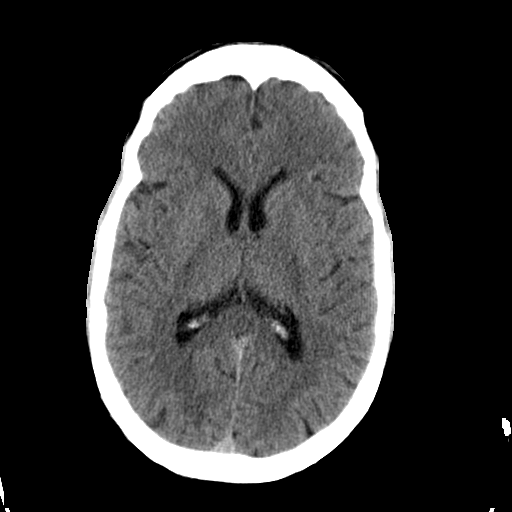
[im 19/33  brain]
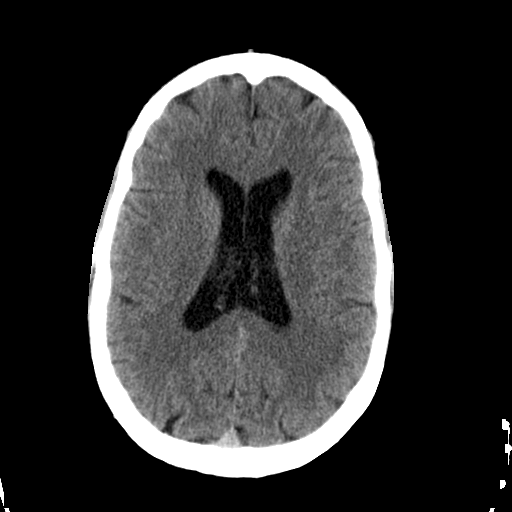
[im 21/33  brain]
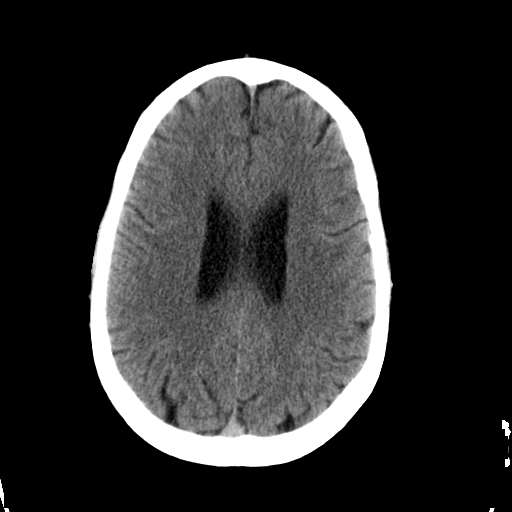
[im 21/33  bone]
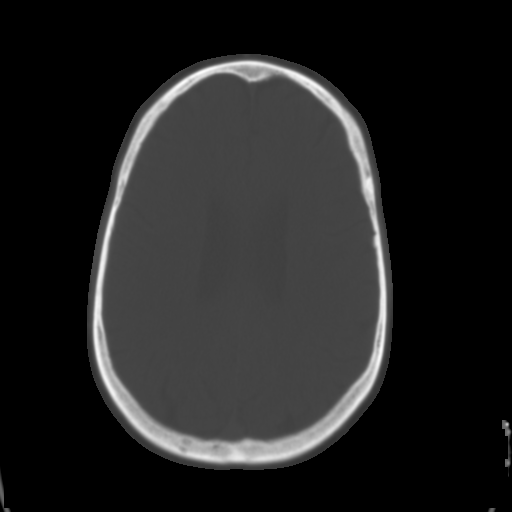
[im 23/33  brain]
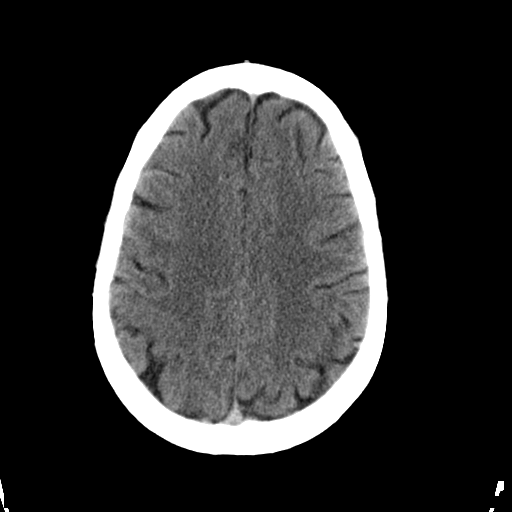
[im 26/33  brain]
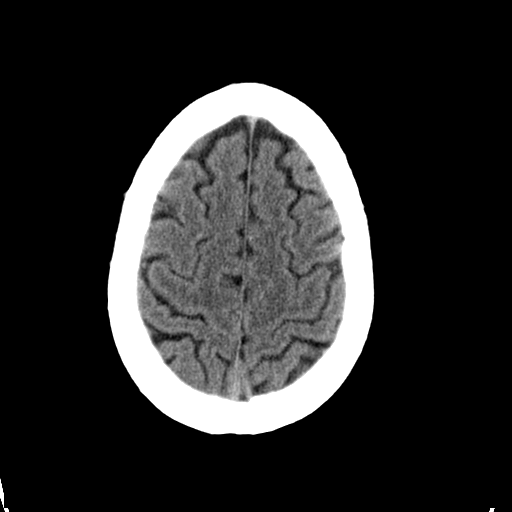
[im 28/33  brain]
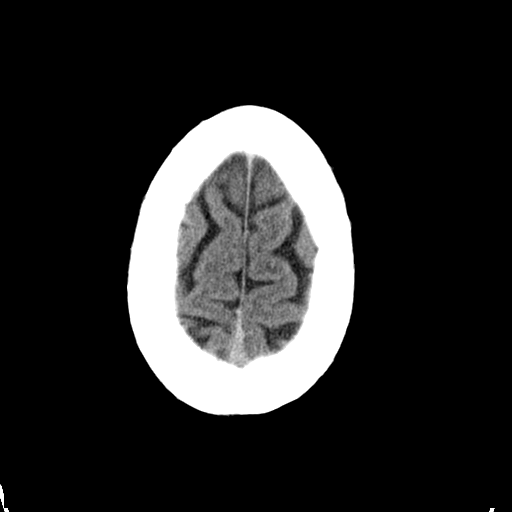
[im 30/33  brain]
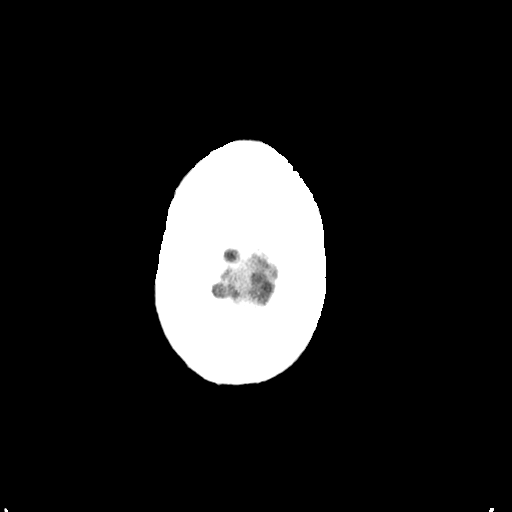
[im 30/33  bone]
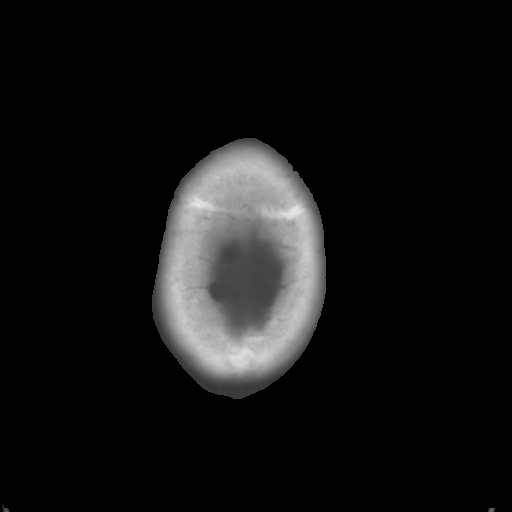

[Series 3: bone · axial · 0.44mm/px · z∈[+238,+284]mm · 3 of 33 slices shown]
[im 3/33  bone]
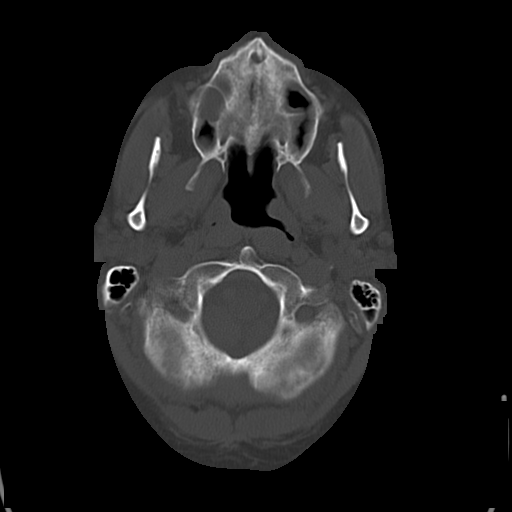
[im 7/33  bone]
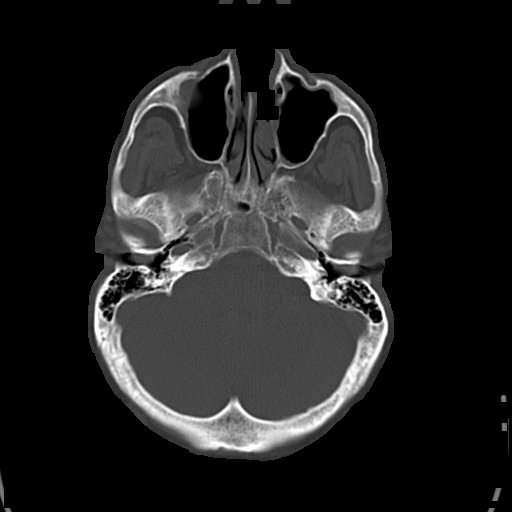
[im 12/33  bone]
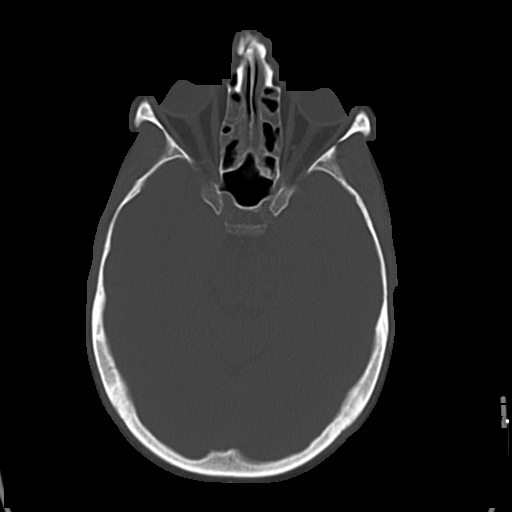

[16 of 30 positions shown; findings below may reference images not displayed]

FINDINGS: There is no evidence for mass effect, midline shift, or extra-axial fluid
collections. There is no evidence for space-occupying lesion, intracranial
hemorrhage, or cortical-based area of infarction.

There is moderate opacification of the ethmoid air cells. There is mild
mucosal thickening of the maxillary sinuses. A mucus retention cyst is seen
in the right maxillary sinus.

There is an old right nasal bone fracture, similar to prior.
IMPRESSION: 1. No acute intracranial process.
2. Paranasal sinus disease.

## 2013-03-05 ENCOUNTER — Emergency Department: Payer: Self-pay | Admitting: Emergency Medicine

## 2013-03-05 LAB — CBC
HCT: 47.7 % (ref 40.0–52.0)
HGB: 16.1 g/dL (ref 13.0–18.0)
MCHC: 33.7 g/dL (ref 32.0–36.0)
Platelet: 187 10*3/uL (ref 150–440)
RBC: 5.41 10*6/uL (ref 4.40–5.90)
RDW: 14.5 % (ref 11.5–14.5)
WBC: 11 10*3/uL — ABNORMAL HIGH (ref 3.8–10.6)

## 2013-03-05 LAB — DRUG SCREEN, URINE
Amphetamines, Ur Screen: NEGATIVE (ref ?–1000)
Barbiturates, Ur Screen: NEGATIVE (ref ?–200)
Benzodiazepine, Ur Scrn: NEGATIVE (ref ?–200)
Cocaine Metabolite,Ur ~~LOC~~: POSITIVE (ref ?–300)
MDMA (Ecstasy)Ur Screen: NEGATIVE (ref ?–500)
Phencyclidine (PCP) Ur S: NEGATIVE (ref ?–25)

## 2013-03-05 LAB — CK TOTAL AND CKMB (NOT AT ARMC)
CK, Total: 155 U/L (ref 35–232)
CK-MB: 2.9 ng/mL (ref 0.5–3.6)

## 2013-03-05 LAB — BASIC METABOLIC PANEL
Anion Gap: 6 — ABNORMAL LOW (ref 7–16)
Calcium, Total: 8.8 mg/dL (ref 8.5–10.1)
Chloride: 111 mmol/L — ABNORMAL HIGH (ref 98–107)
Co2: 24 mmol/L (ref 21–32)
EGFR (African American): >60
EGFR (Non-African Amer.): 56 — ABNORMAL LOW
Glucose: 112 mg/dL — ABNORMAL HIGH (ref 65–99)
Osmolality: 286 (ref 275–301)
Potassium: 3.9 mmol/L (ref 3.5–5.1)

## 2013-03-05 LAB — PROTIME-INR: INR: 1

## 2013-03-05 LAB — ETHANOL: Ethanol %: <0.003 % (ref 0.000–0.080)

## 2013-03-08 IMAGING — US ABDOMEN ULTRASOUND
1 series · 13 of 25 positions shown · non-contrast
Comparison: none

REASON FOR EXAM: left abd pain
COMMENTS:   May transport without cardiac monitor

[Series 1: abdomen ultrasound · 0.28mm/px · 13 of 83 slices shown]
[im 1/83]
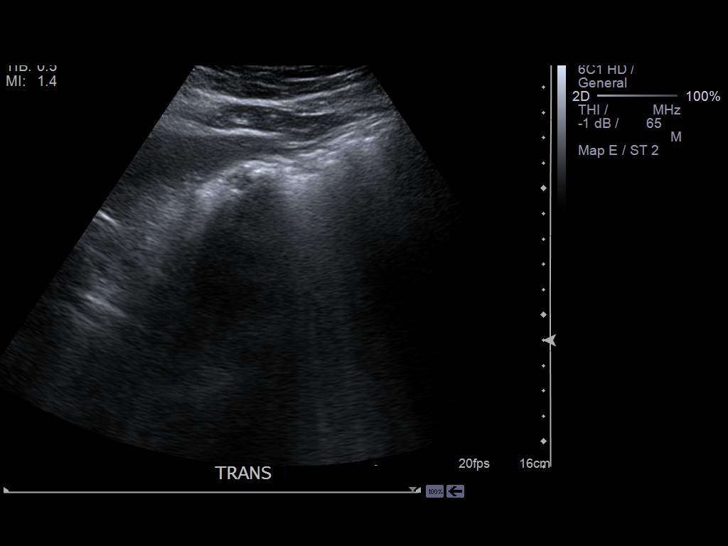
[im 7/83]
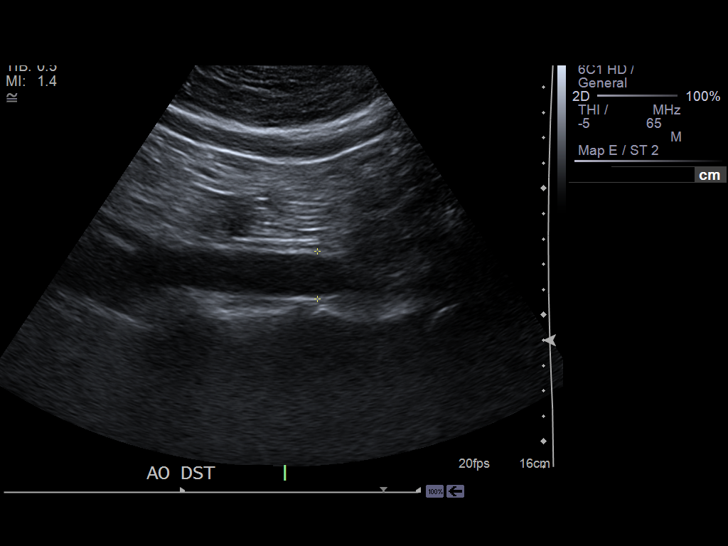
[im 14/83]
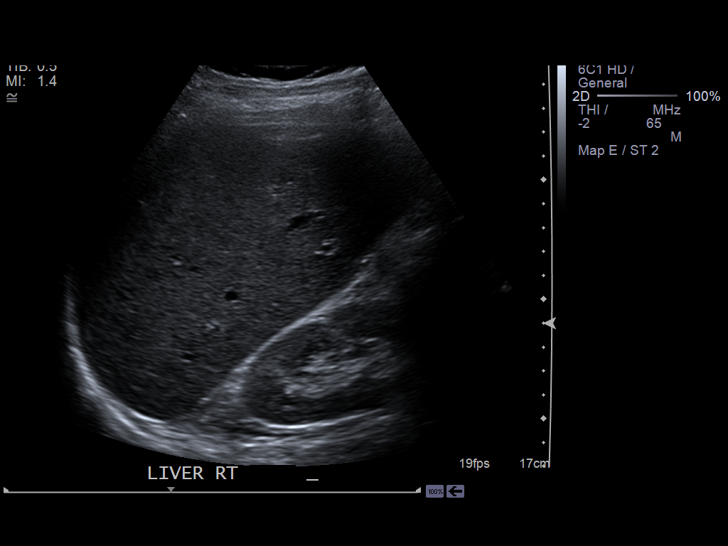
[im 21/83]
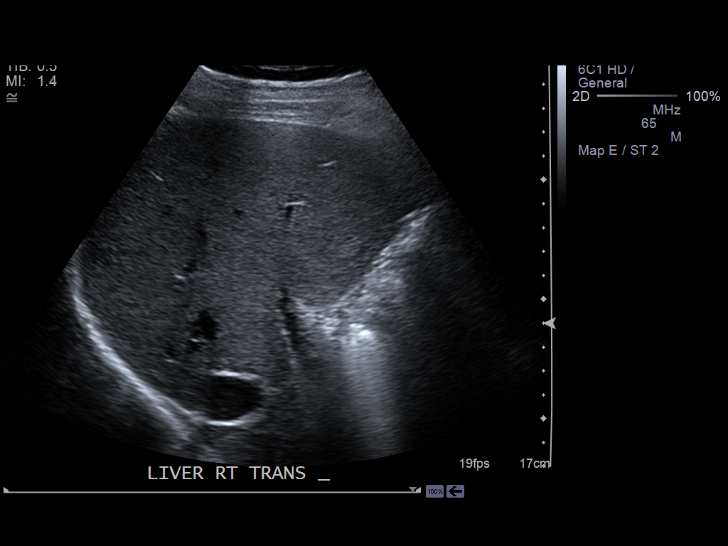
[im 28/83]
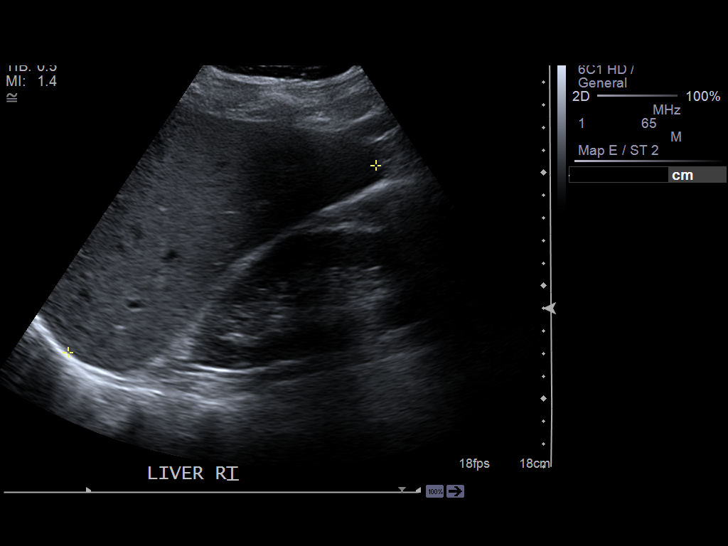
[im 35/83]
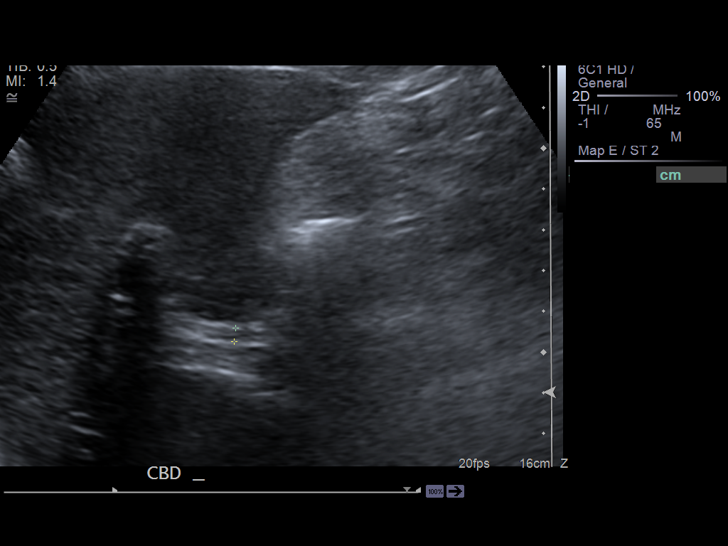
[im 42/83]
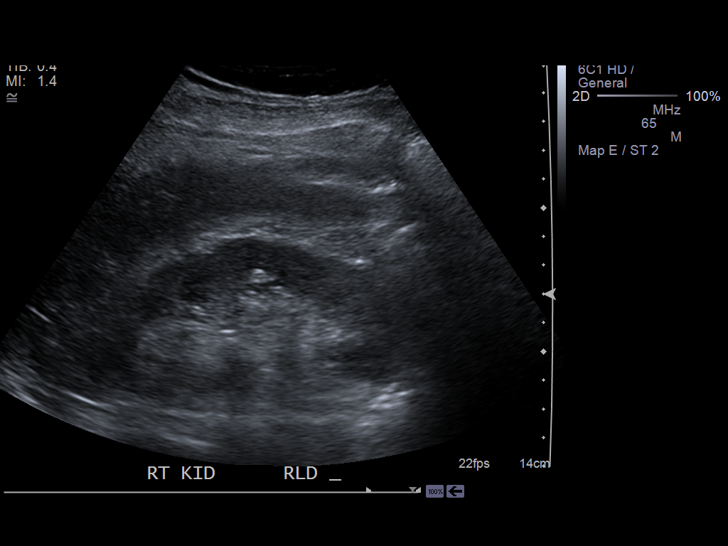
[im 48/83]
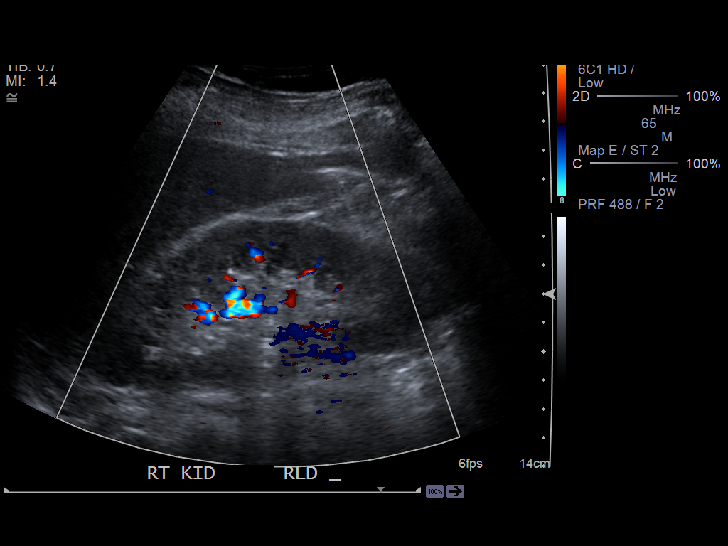
[im 55/83]
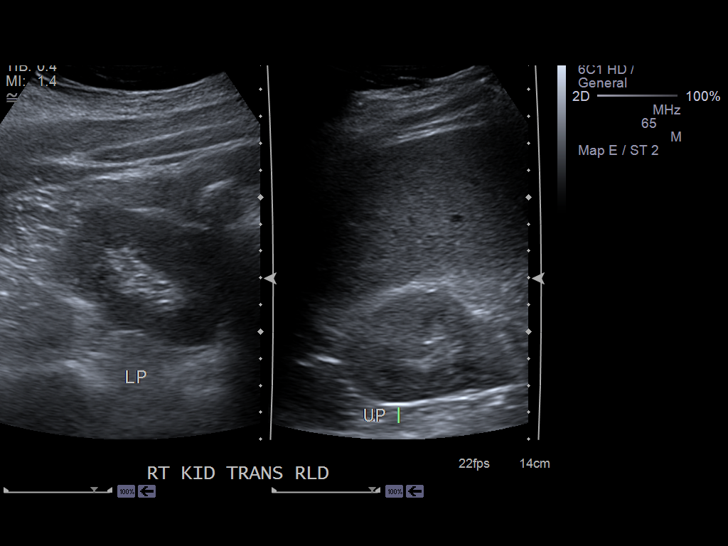
[im 62/83]
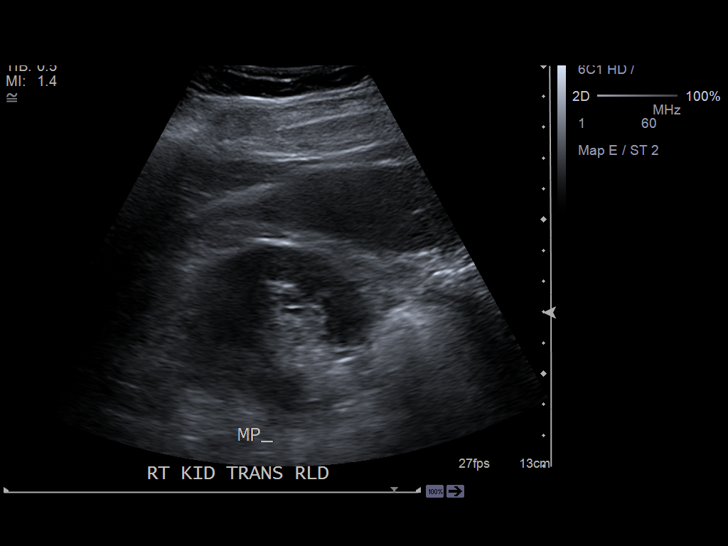
[im 69/83]
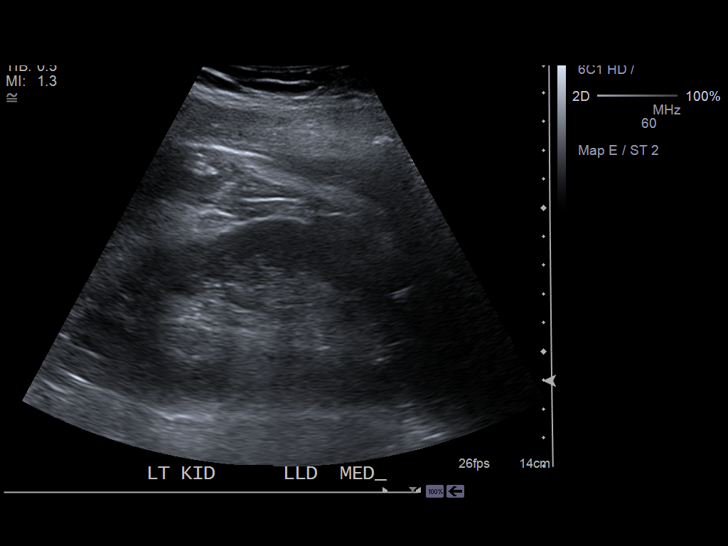
[im 76/83]
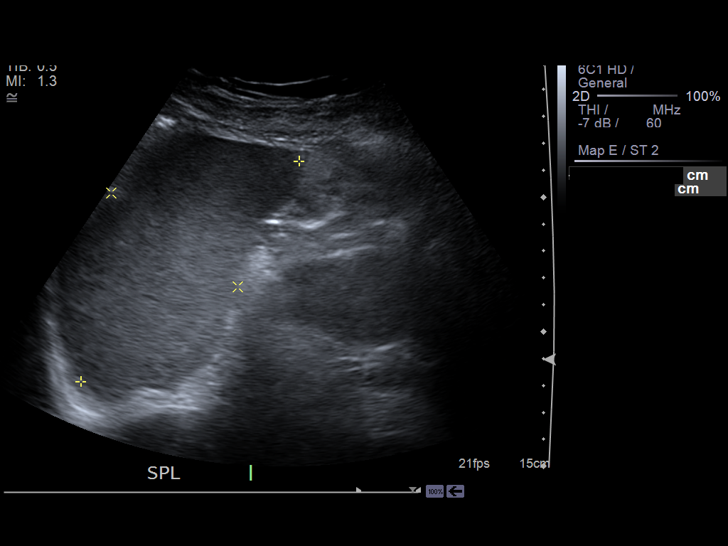
[im 83/83]
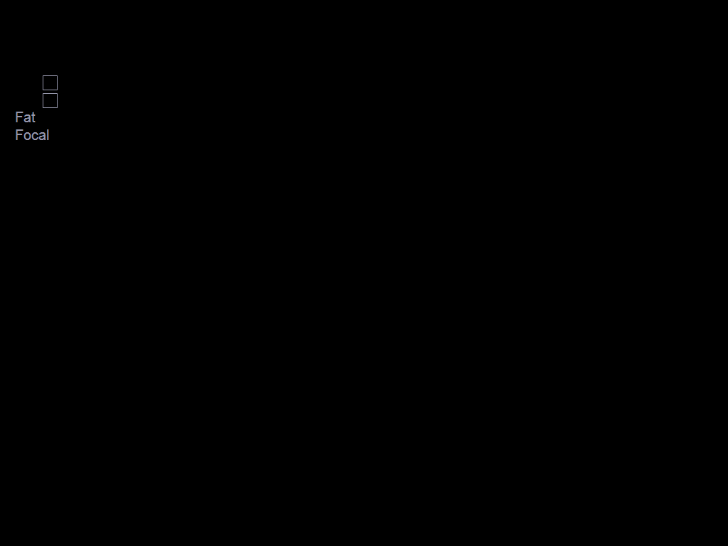

[13 of 25 positions shown; findings below may reference images not displayed]

PROCEDURE:     US  - US ABDOMEN GENERAL SURVEY  - May 11, 2012  [DATE]

RESULT:     The liver exhibits normal echotexture with no focal mass nor
ductal dilation. Portal venous flow is normal in direction toward the liver.
The gallbladder is surgically absent. There is no positive sonographic
Murphy's sign. The common bile duct measures 3.3 mm in diameter.

The pancreas could not be demonstrated due to the presence of bowel gas. The
spleen, abdominal aorta, and inferior vena caval exhibit no acute
abnormality. The kidneys exhibit no hydronephrosis. The right kidney
exhibits an area of heterogeneous echotexture in the midpole with some
hyperechogenicity. This measures 7 x 11 mm. No abnormality was seen here on
the earlier noncontrast CT scan 18 April, 2012.
IMPRESSION: 1. The gallbladder is surgically absent. The pancreas could not be
demonstrated due to bowel gas.

2. The liver and spleen and abdominal aorta and inferior vena cava exhibit
no acute abnormality.
3. No definite abnormality of the kidneys are seen. There is heterogeneous
echotexture in the midpole of the right kidney. This area was normal in
appearance on earlier CT scans.

## 2013-04-02 IMAGING — CR DG CHEST 1V PORT
1 series · 1 of 1 positions shown · non-contrast
Comparison: none

REASON FOR EXAM: UPRIGHT; HEMATEMESIS
COMMENTS:

[ap]
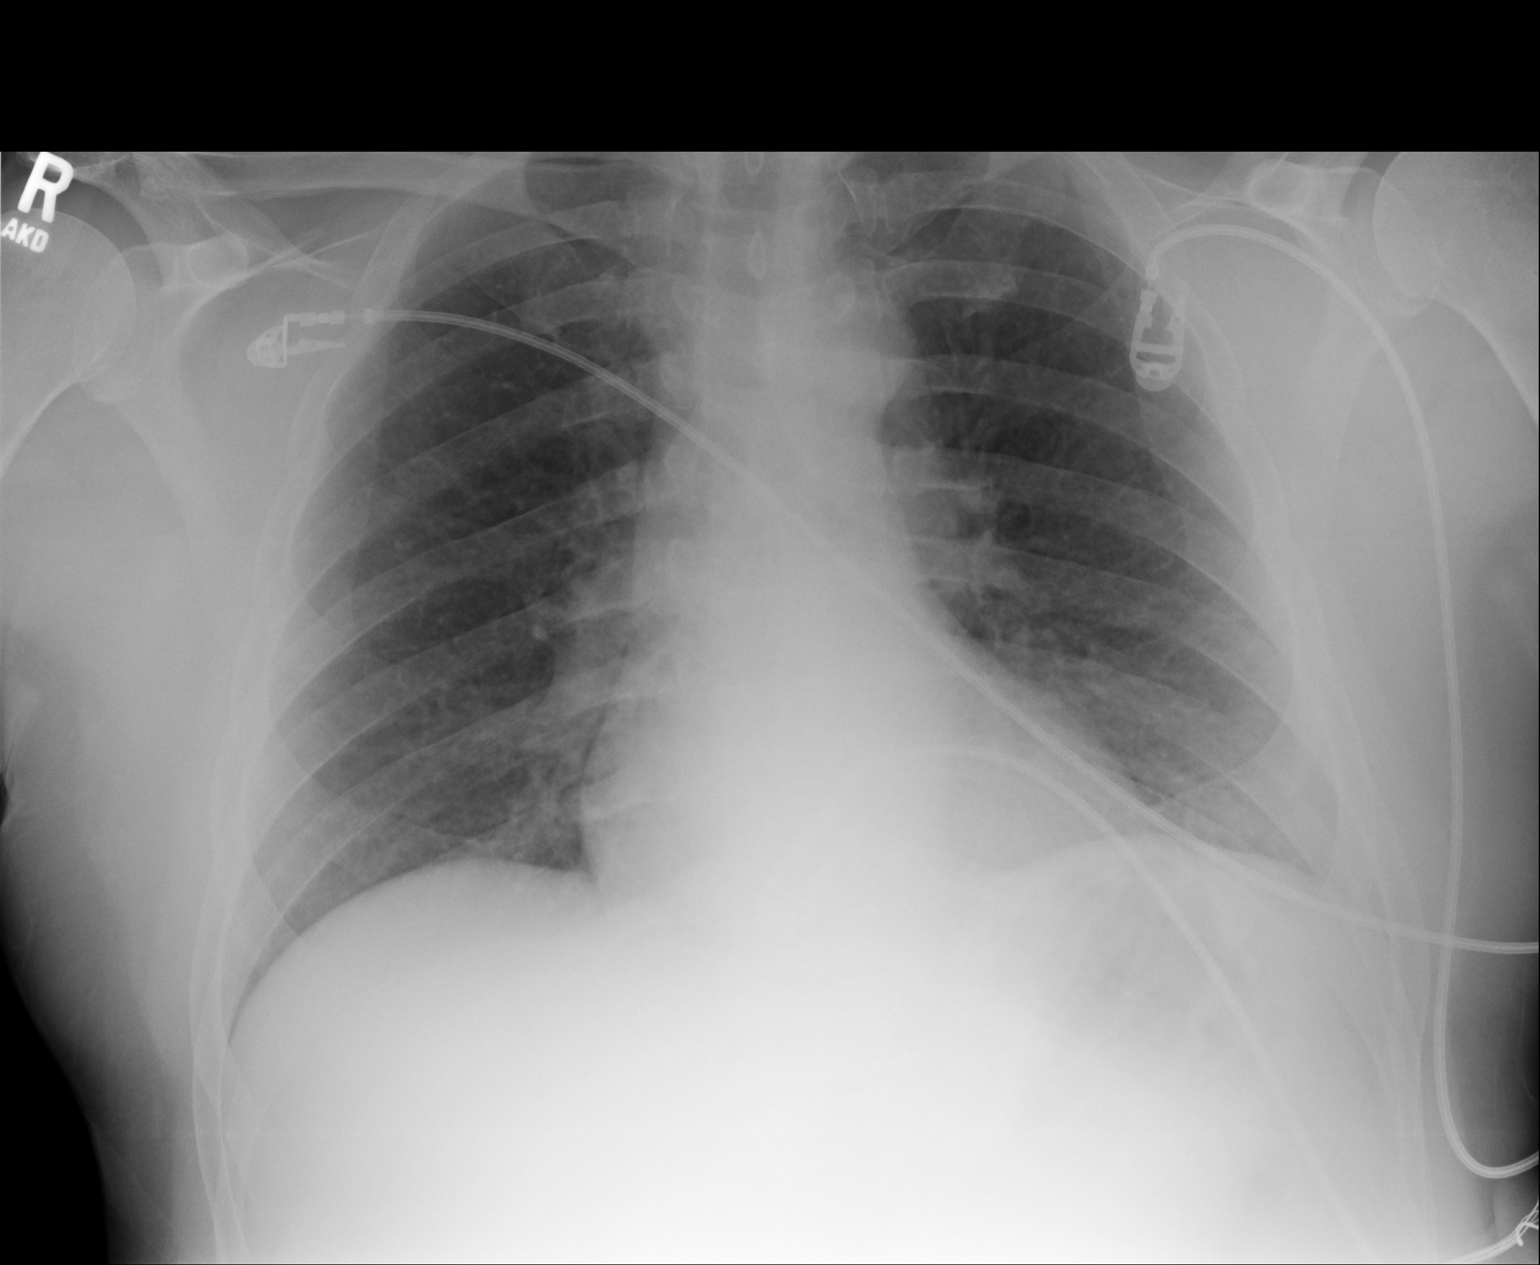

[1 of 1 positions shown; findings below may reference images not displayed]

PROCEDURE:     DXR - DXR PORTABLE CHEST SINGLE VIEW  - June 05, 2012 [DATE]

RESULT:     Comparison is made to the study 19 April, 2012.

The lungs are well-expanded and clear. The cardiac silhouette is normal in
size. The pulmonary vascularity is not engorged. There is no pleural
effusion. The mediastinum is normal in width.
IMPRESSION: I do not see acute cardiopulmonary abnormality.

## 2013-04-08 IMAGING — CR DG CHEST 2V
1 series · 2 of 2 positions shown · non-contrast
Comparison: none

REASON FOR EXAM: cough
COMMENTS:

[Series 1: w chest pa · 0.14mm/px · 2 of 2 slices shown]
[im 1/2]
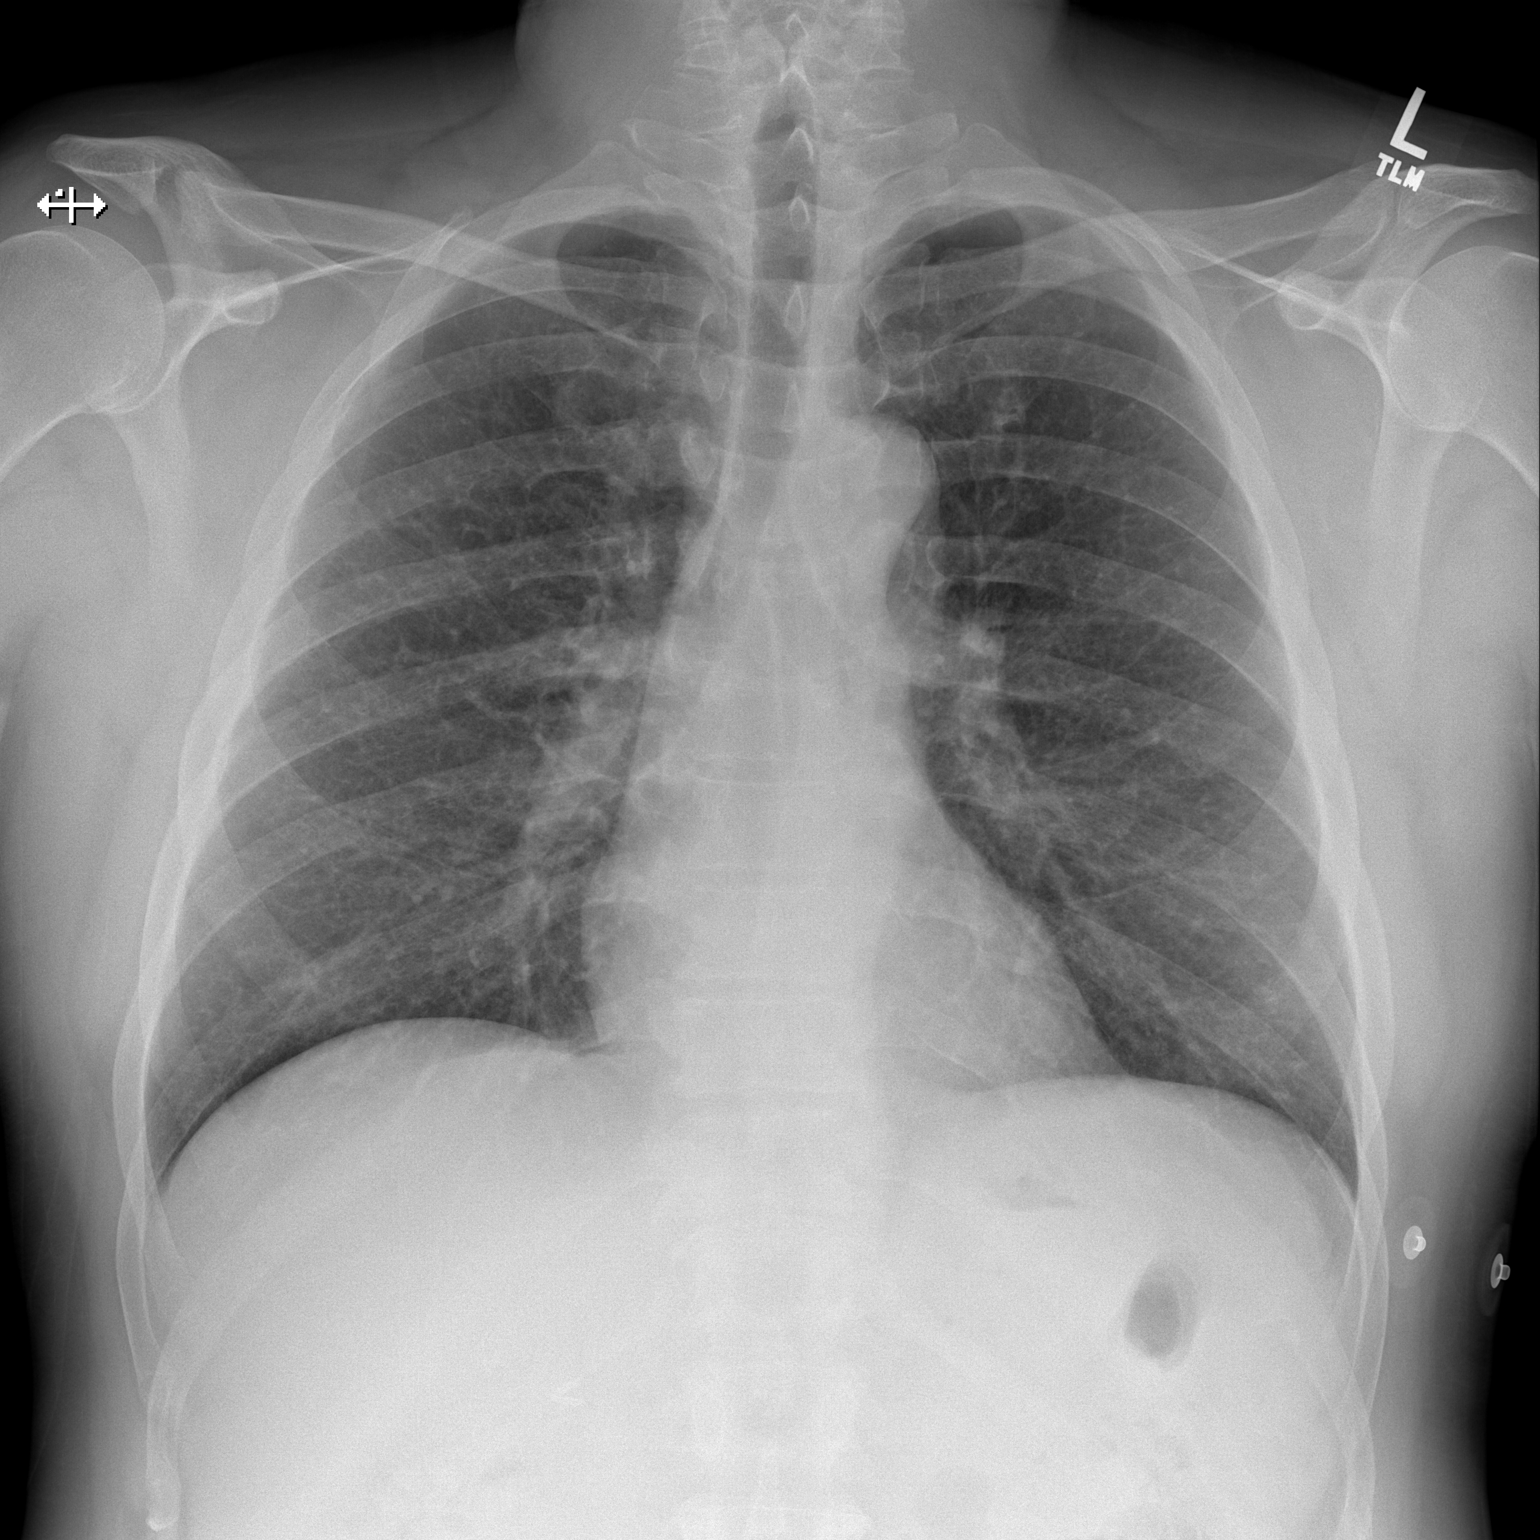
[im 2/2]
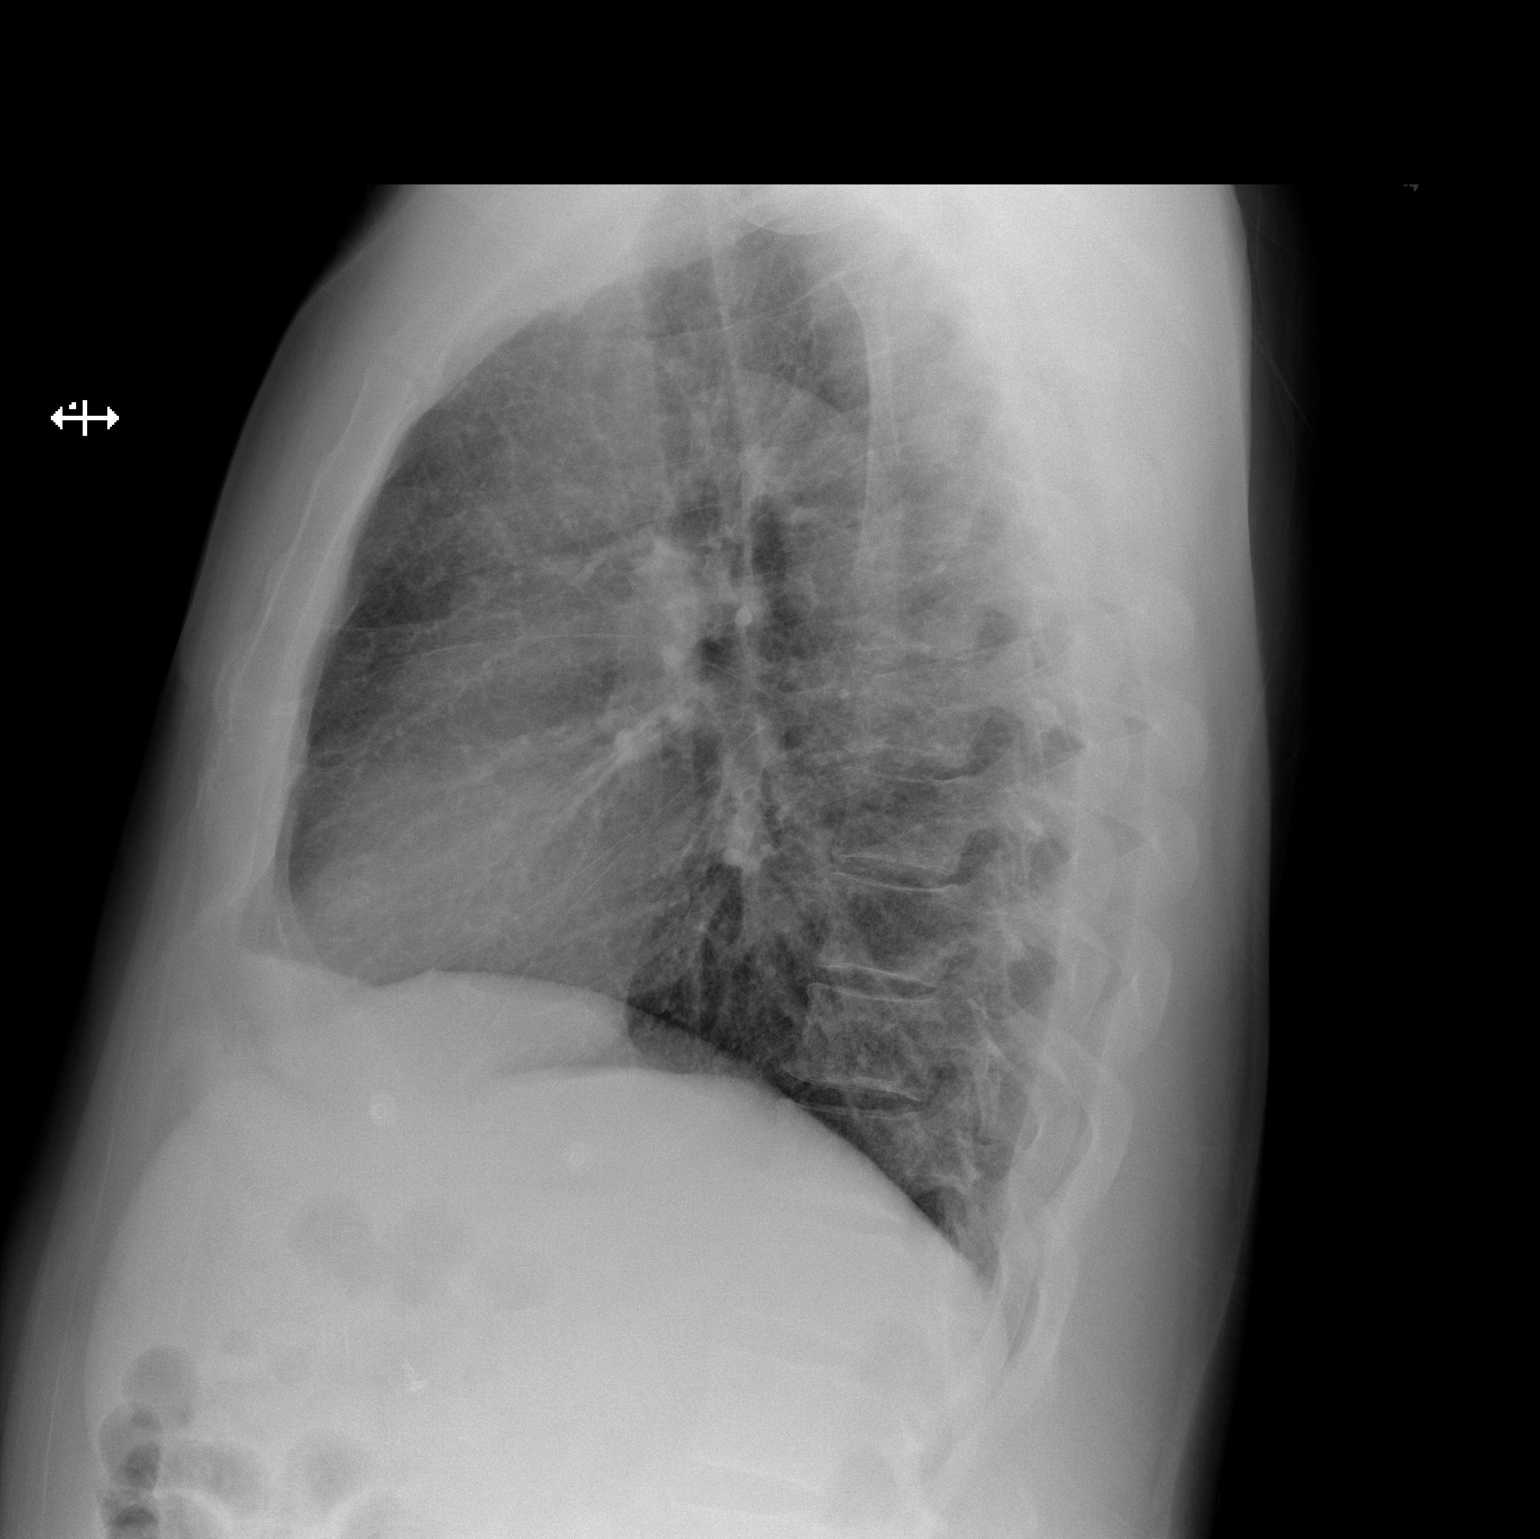

[2 of 2 positions shown; findings below may reference images not displayed]

PROCEDURE:     DXR - DXR CHEST PA (OR AP) AND LATERAL  - June 11, 2012  [DATE]

RESULT:     Comparison is made to the prior exam 06/05/2012. The lung fields
are clear of infiltrate. No pneumonia, pneumothorax or pleural effusion is
seen. No lung mass is noted. There is mild thickening of the interstitial
lung markings compatible with interstitial fibrotic change. Heart size is
normal. No significant osseous abnormalities are seen.
IMPRESSION: 1. No acute changes are identified.
2. There is thickening of the interstitial lung markings bilaterally
compatible with chronic inflammatory change.
3. No pneumonia is seen.
4. Heart size is normal.

[REDACTED]

## 2013-04-10 IMAGING — CR DG CHEST 2V
1 series · 2 of 2 positions shown · non-contrast
Comparison: none

REASON FOR EXAM: cough
COMMENTS:

[Series 3: w chest pa · 0.14mm/px · 2 of 2 slices shown]
[im 1/2]
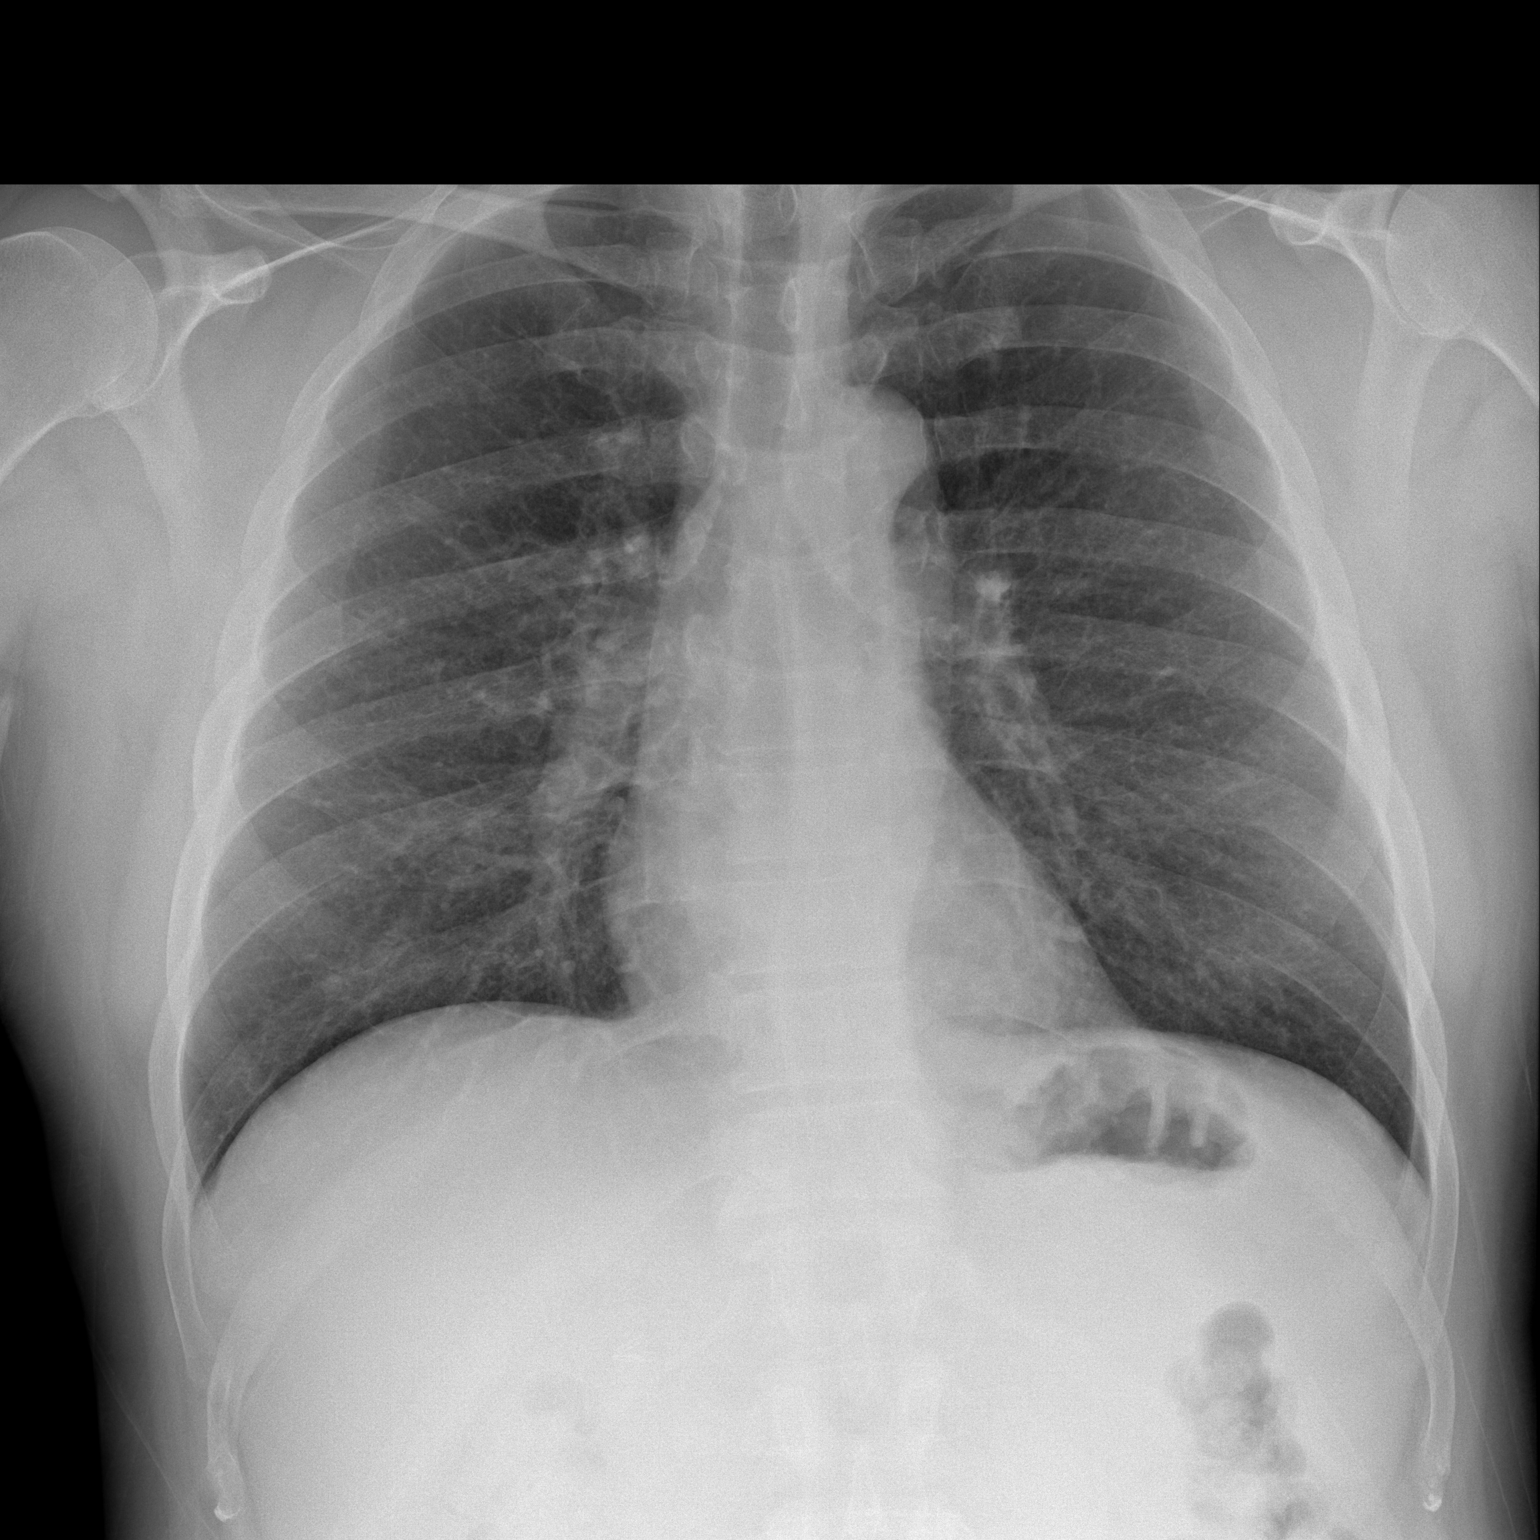
[im 2/2]
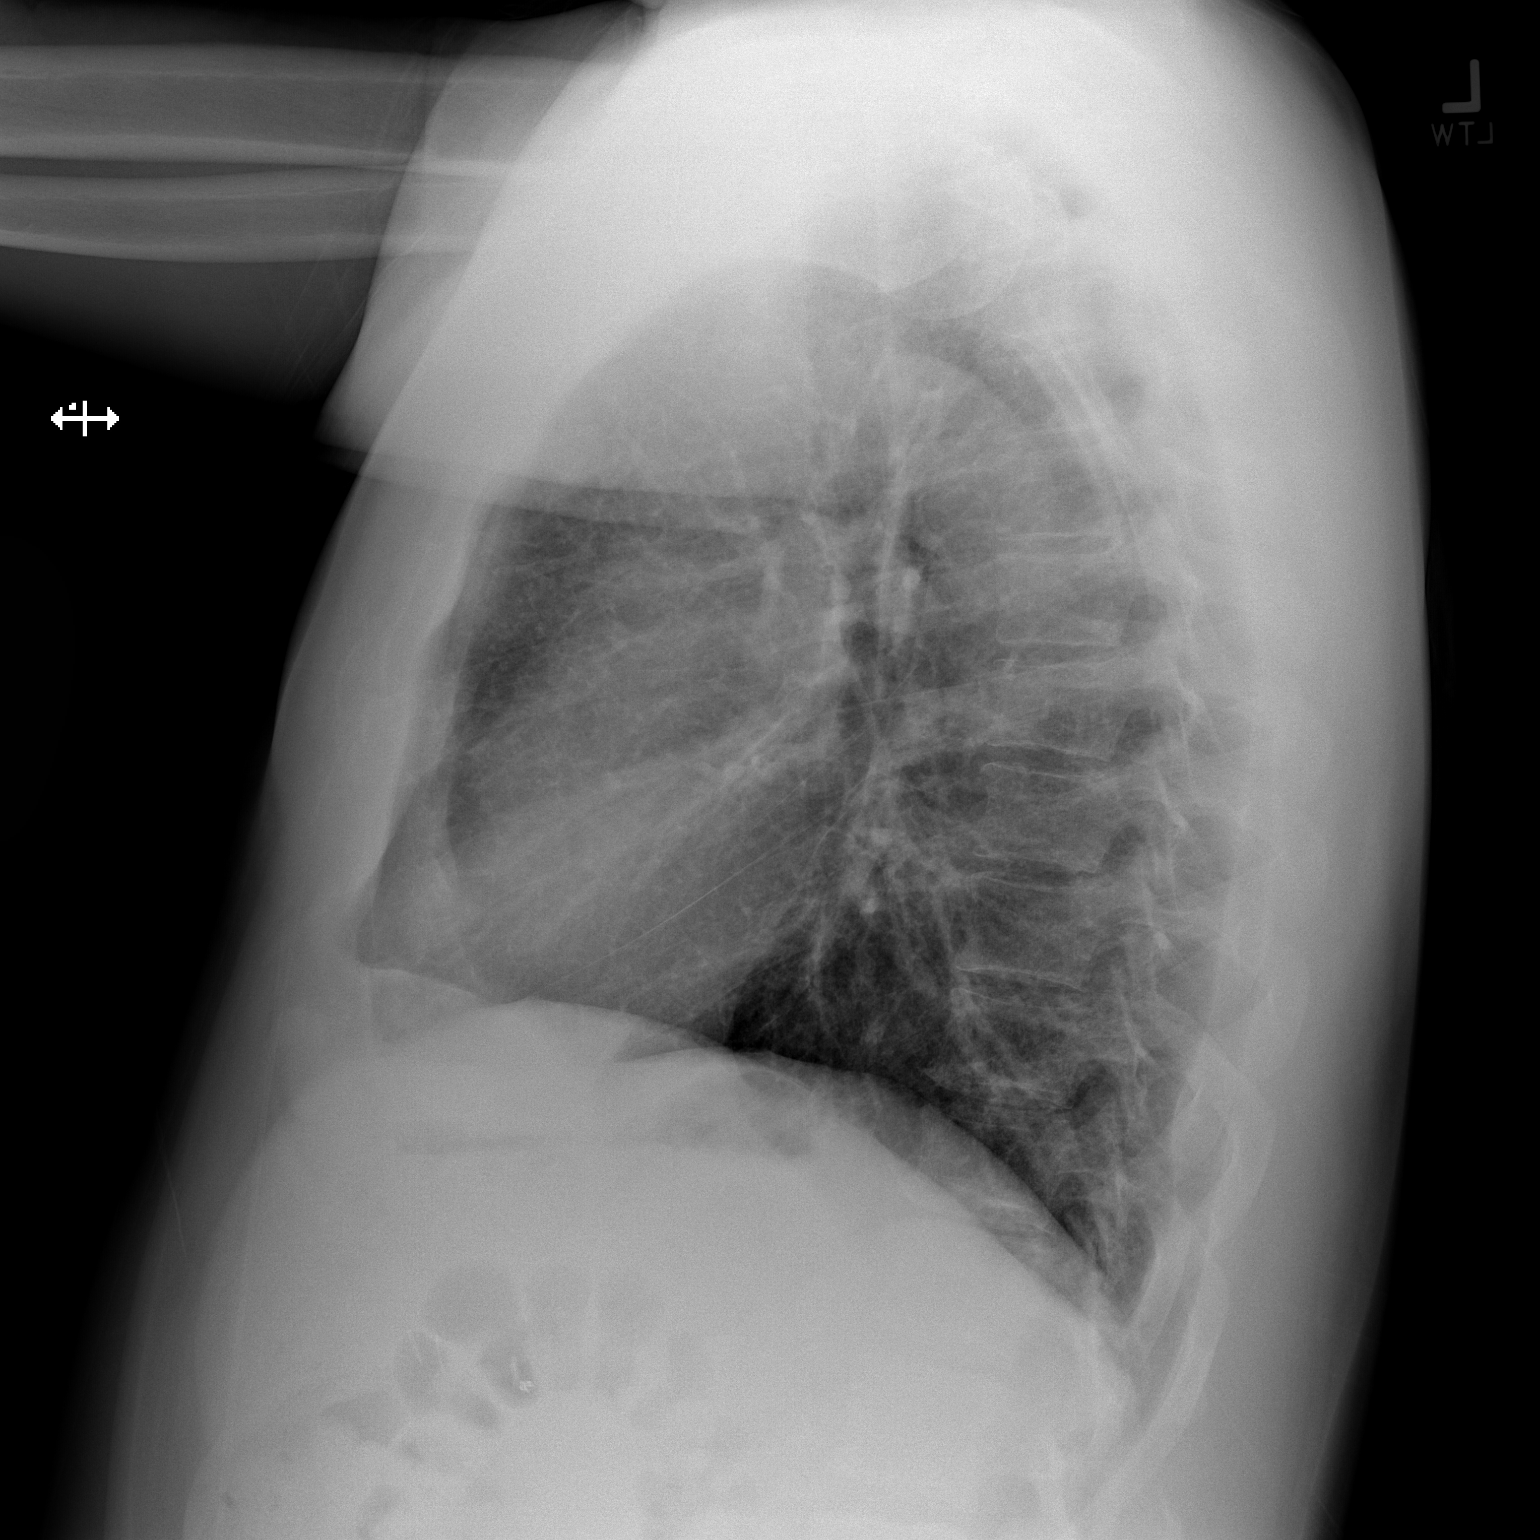

[2 of 2 positions shown; findings below may reference images not displayed]

PROCEDURE:     DXR - DXR CHEST PA (OR AP) AND LATERAL  - June 13, 2012 [DATE]

RESULT:     Comparison is made to the images dated [DATE].

The lungs are clear. The heart and pulmonary vessels are normal. The bony
and mediastinal structures are unremarkable. There is no effusion. There is
no pneumothorax or evidence of congestive failure.
IMPRESSION: No acute cardiopulmonary disease.

[REDACTED]

## 2013-04-27 ENCOUNTER — Inpatient Hospital Stay: Payer: Self-pay | Admitting: Psychiatry

## 2013-04-27 LAB — CBC
HCT: 52 %
HGB: 17.7 g/dL
MCH: 30.2 pg
MCHC: 34 g/dL
MCV: 89 fL
Platelet: 234 x10 3/mm 3
RBC: 5.85 x10 6/mm 3
RDW: 14.4 %
WBC: 11.8 x10 3/mm 3 — ABNORMAL HIGH

## 2013-04-27 LAB — DRUG SCREEN, URINE
Amphetamines, Ur Screen: NEGATIVE
Barbiturates, Ur Screen: NEGATIVE
Benzodiazepine, Ur Scrn: NEGATIVE
Cannabinoid 50 Ng, Ur ~~LOC~~: NEGATIVE
Cocaine Metabolite,Ur ~~LOC~~: POSITIVE
MDMA (Ecstasy)Ur Screen: NEGATIVE
Methadone, Ur Screen: NEGATIVE
Opiate, Ur Screen: NEGATIVE
Phencyclidine (PCP) Ur S: NEGATIVE
Tricyclic, Ur Screen: NEGATIVE

## 2013-04-27 LAB — URINALYSIS, COMPLETE
Bacteria: NONE SEEN
Bilirubin,UR: NEGATIVE
Blood: NEGATIVE
Glucose,UR: NEGATIVE mg/dL
Ketone: NEGATIVE
Leukocyte Esterase: NEGATIVE
Nitrite: NEGATIVE
Ph: 6
Protein: NEGATIVE
RBC,UR: <1 /HPF
Specific Gravity: 1.003
Squamous Epithelial: NONE SEEN
WBC UR: <1 /HPF

## 2013-04-27 LAB — COMPREHENSIVE METABOLIC PANEL
Albumin: 4 g/dL (ref 3.4–5.0)
Alkaline Phosphatase: 87 U/L (ref 50–136)
Anion Gap: 8 (ref 7–16)
BUN: 11 mg/dL (ref 7–18)
Bilirubin,Total: 0.4 mg/dL (ref 0.2–1.0)
Chloride: 103 mmol/L (ref 98–107)
Creatinine: 0.91 mg/dL (ref 0.60–1.30)
EGFR (African American): >60
EGFR (Non-African Amer.): >60
Glucose: 99 mg/dL (ref 65–99)
SGOT(AST): 43 U/L — ABNORMAL HIGH (ref 15–37)
SGPT (ALT): 55 U/L (ref 12–78)
Total Protein: 8 g/dL (ref 6.4–8.2)

## 2013-04-27 LAB — TSH: Thyroid Stimulating Horm: 1.03 u[IU]/mL

## 2013-04-27 LAB — SALICYLATE LEVEL: Salicylates, Serum: 4 mg/dL — ABNORMAL HIGH

## 2013-04-27 LAB — ACETAMINOPHEN LEVEL: Acetaminophen: <2 ug/mL

## 2013-04-27 LAB — ETHANOL: Ethanol: 186 mg/dL

## 2013-05-06 ENCOUNTER — Emergency Department: Payer: Self-pay | Admitting: Emergency Medicine

## 2013-05-06 LAB — DRUG SCREEN, URINE
Barbiturates, Ur Screen: NEGATIVE (ref ?–200)
Benzodiazepine, Ur Scrn: NEGATIVE (ref ?–200)
Cannabinoid 50 Ng, Ur ~~LOC~~: NEGATIVE (ref ?–50)
Cocaine Metabolite,Ur ~~LOC~~: POSITIVE (ref ?–300)
MDMA (Ecstasy)Ur Screen: NEGATIVE (ref ?–500)
Opiate, Ur Screen: NEGATIVE (ref ?–300)
Phencyclidine (PCP) Ur S: NEGATIVE (ref ?–25)
Tricyclic, Ur Screen: NEGATIVE (ref ?–1000)

## 2013-06-23 IMAGING — CR DG CHEST 2V
1 series · 2 of 2 positions shown · non-contrast
Comparison: none

REASON FOR EXAM: SOB
COMMENTS:

[Series 1: w chest pa · 0.14mm/px · 2 of 2 slices shown]
[im 1/2]
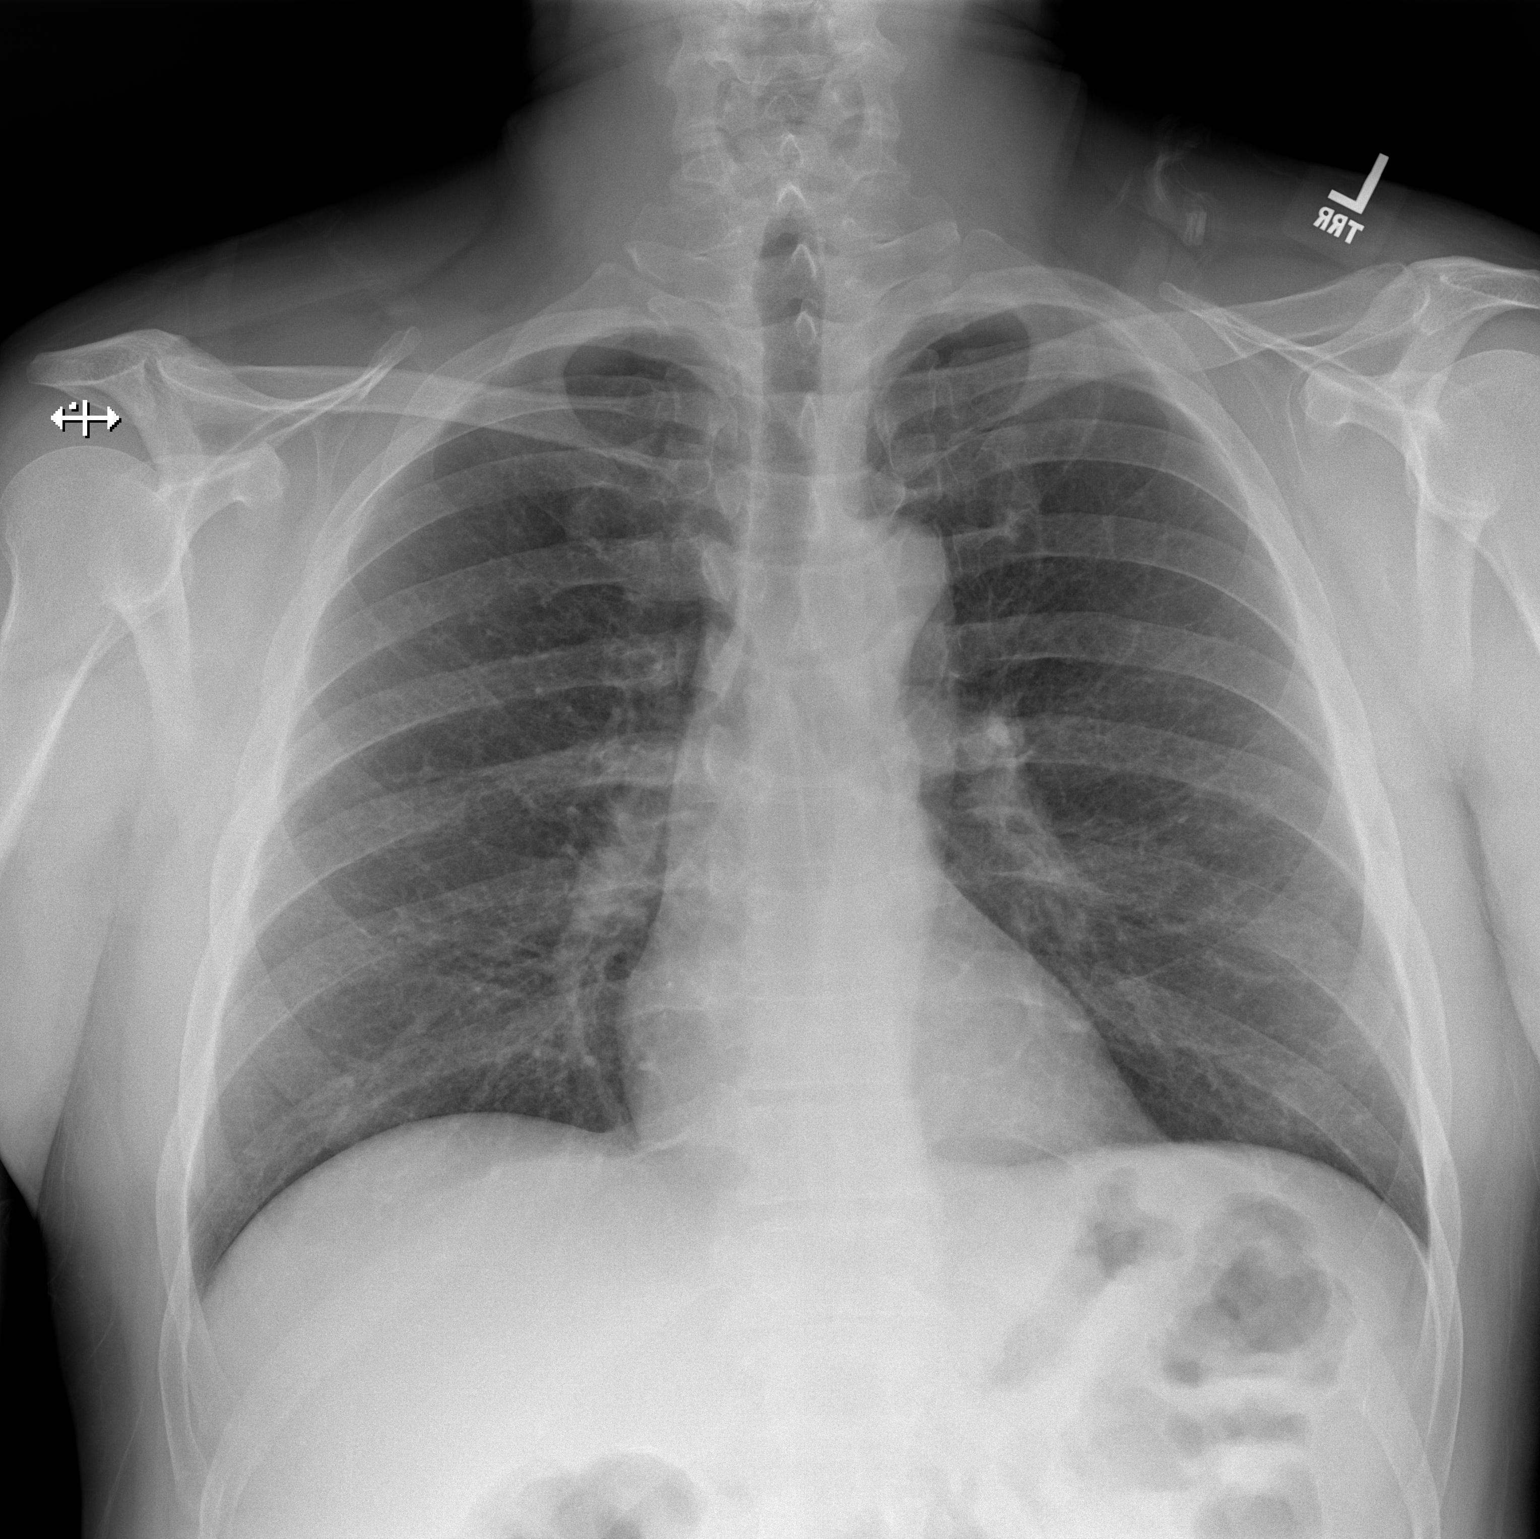
[im 2/2]
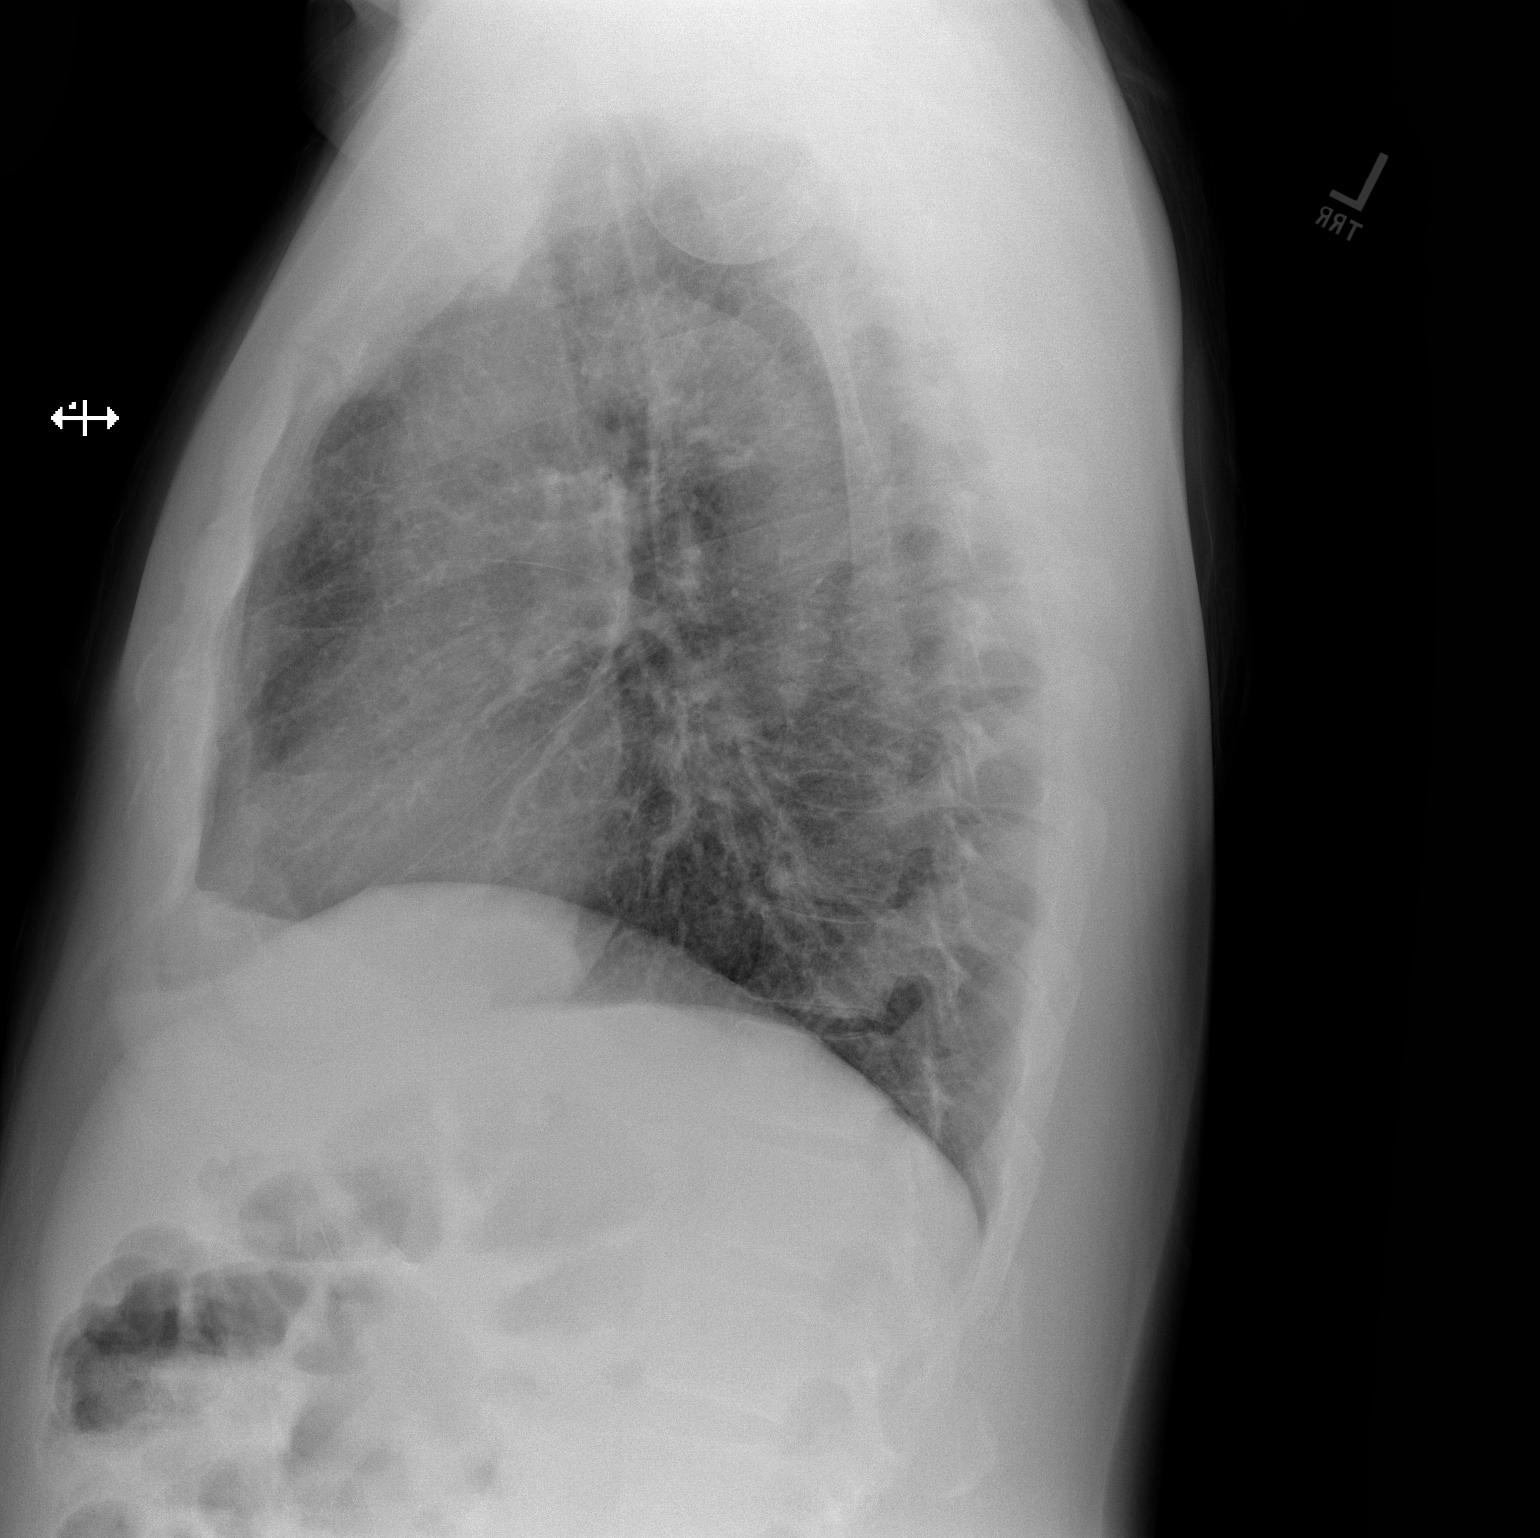

[2 of 2 positions shown; findings below may reference images not displayed]

PROCEDURE:     DXR - DXR CHEST PA (OR AP) AND LATERAL  - August 26, 2012  [DATE]

RESULT:     Comparison is made to the study 13 June, 2012.

The lungs are clear. The heart and pulmonary vessels are normal. The bony
and mediastinal structures are unremarkable. There is no effusion. There is
no pneumothorax or evidence of congestive failure.
IMPRESSION: No acute cardiopulmonary disease.

[REDACTED]

## 2013-07-12 IMAGING — CT CT HEAD WITHOUT CONTRAST
1 series · 16 of 30 positions shown, 20 images · non-contrast
Comparison: none

REASON FOR EXAM: acute left sided flaccidity with altered mental status
COMMENTS:

PROCEDURE:     CT  - CT HEAD WITHOUT CONTRAST  - September 14, 2012  [DATE]
RESULT:     Comparison:  None
TECHNIQUE: Multiple axial images from the foramen magnum to the vertex were
obtained without IV contrast.

[Series 2: soft tissue · axial · 0.46mm/px · z∈[-142,+4]mm · 16 of 33 slices shown, 20 images]
[im 2/33  brain]
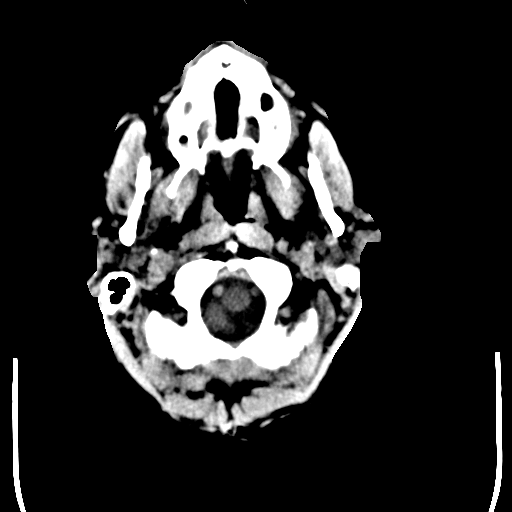
[im 2/33  bone]
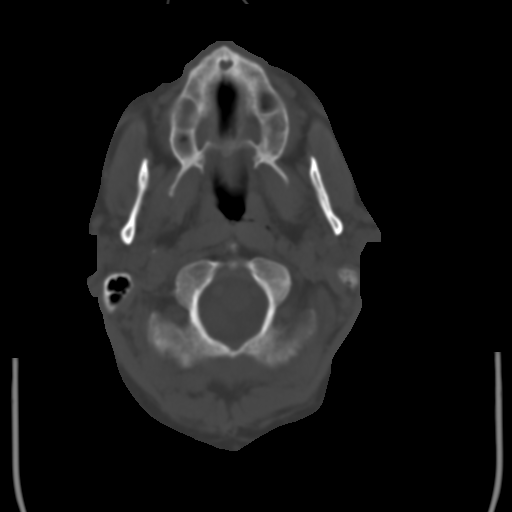
[im 4/33  brain]
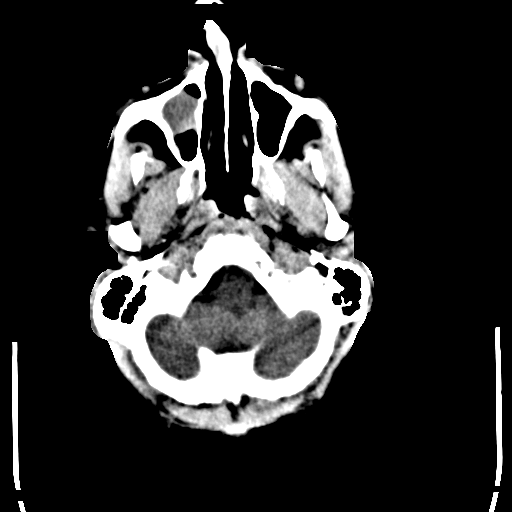
[im 6/33  brain]
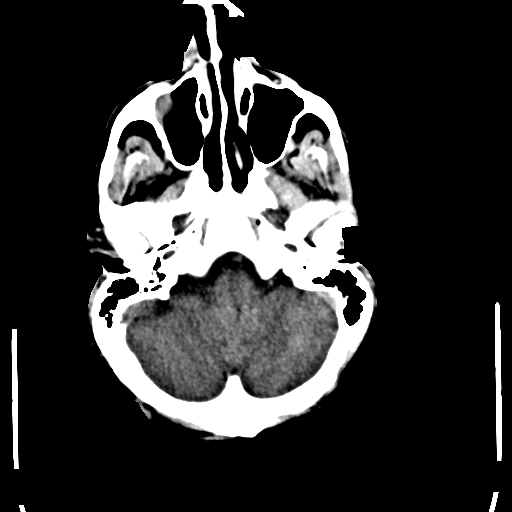
[im 8/33  brain]
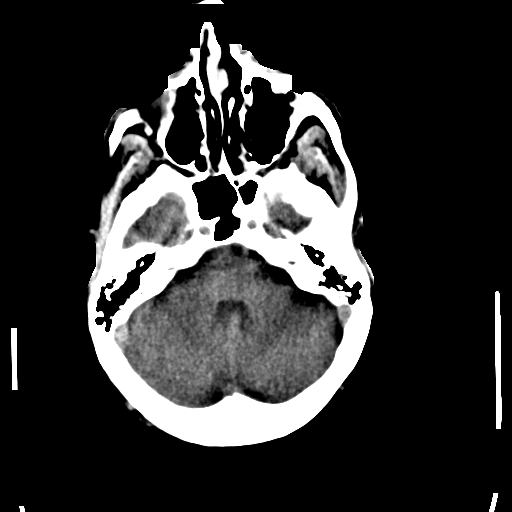
[im 9/33  brain]
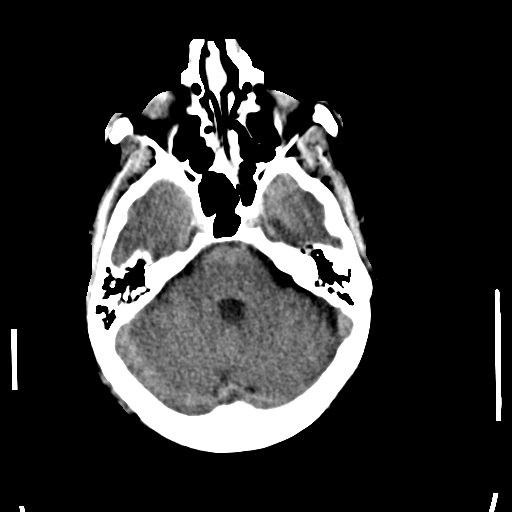
[im 9/33  bone]
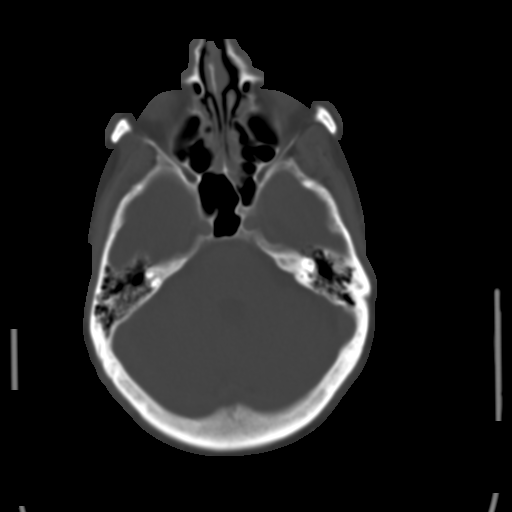
[im 12/33  brain]
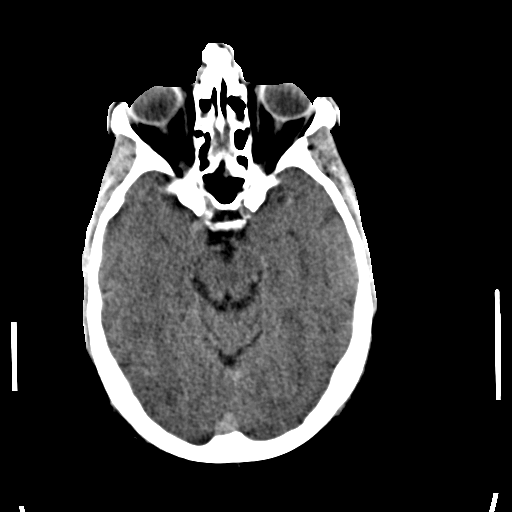
[im 14/33  brain]
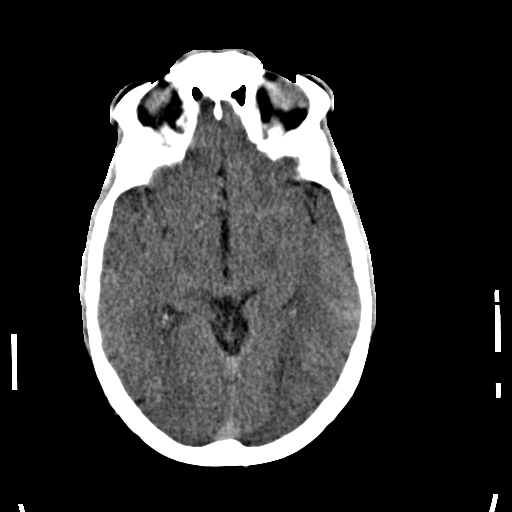
[im 16/33  brain]
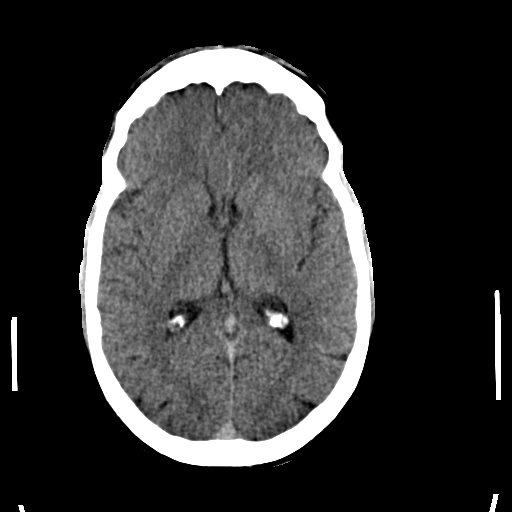
[im 17/33  brain]
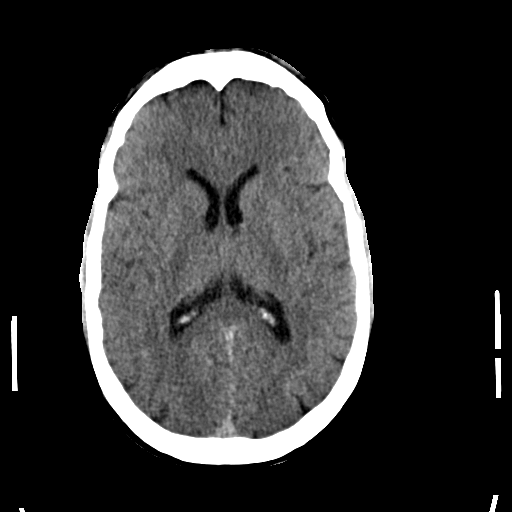
[im 17/33  bone]
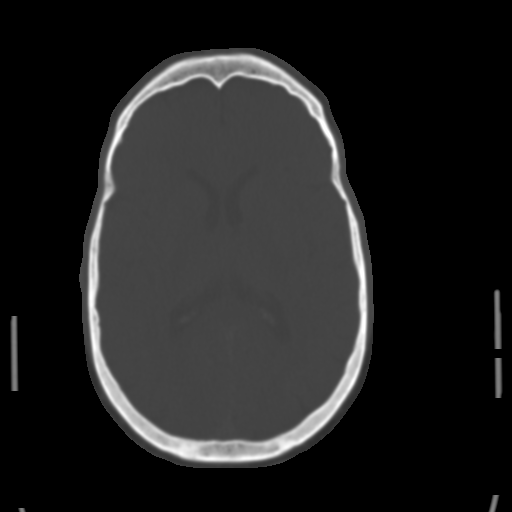
[im 19/33  brain]
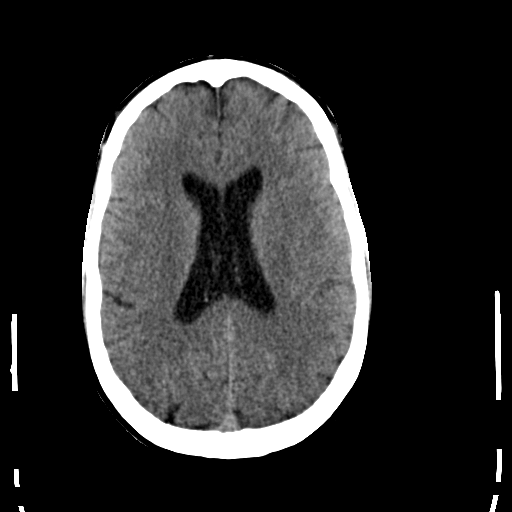
[im 21/33  brain]
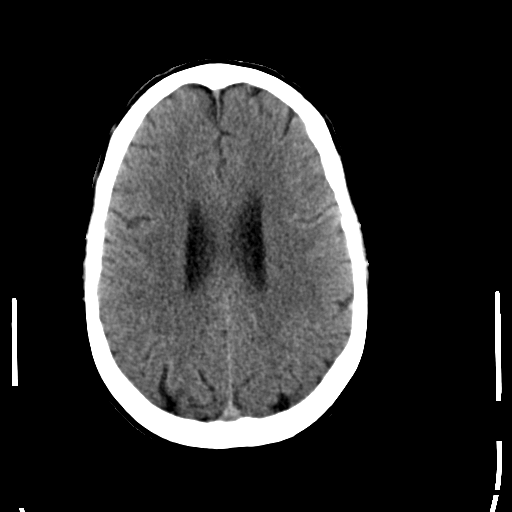
[im 24/33  brain]
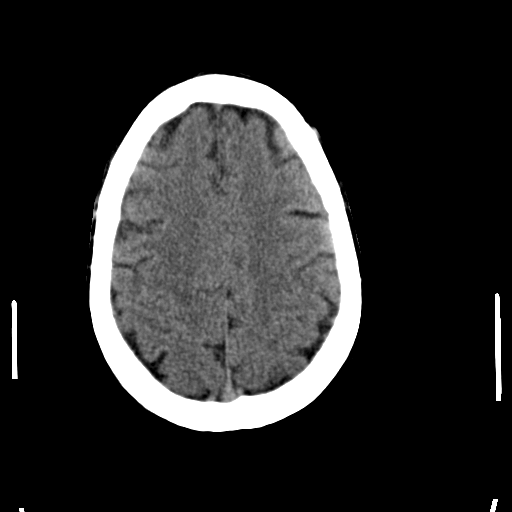
[im 25/33  brain]
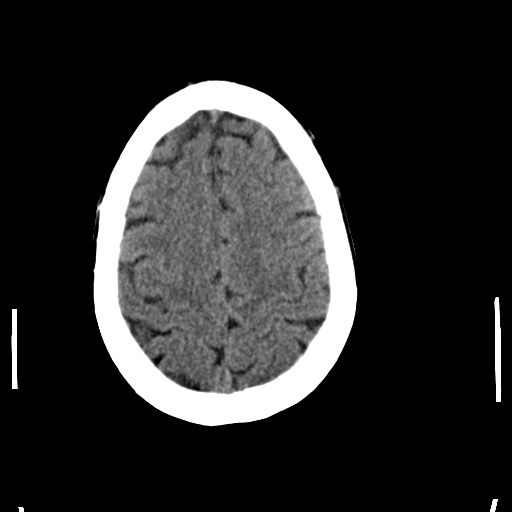
[im 25/33  bone]
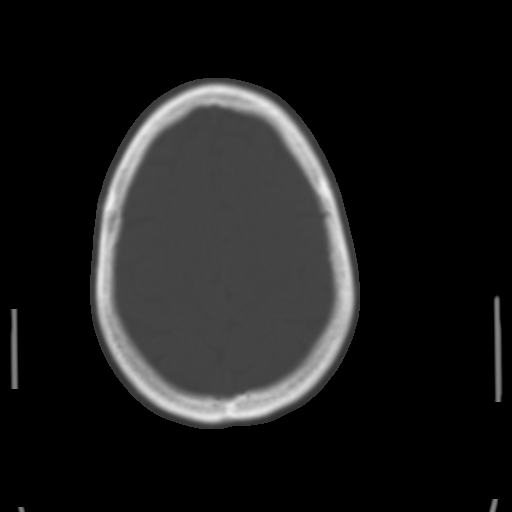
[im 27/33  brain]
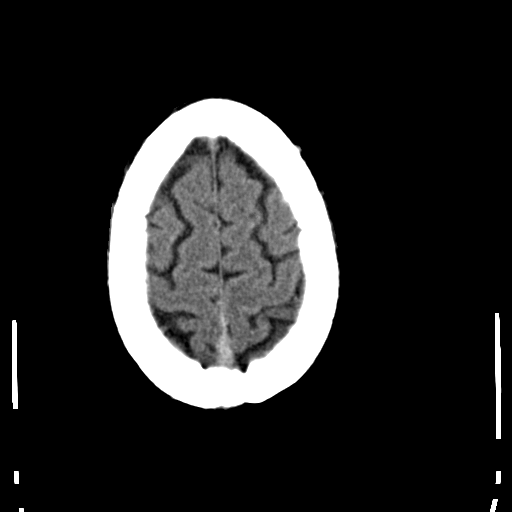
[im 29/33  brain]
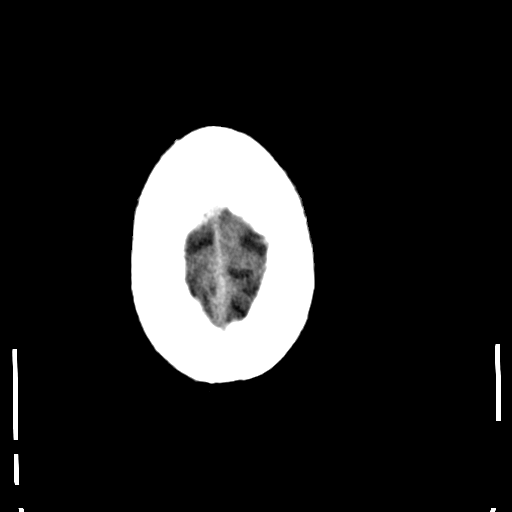
[im 31/33  brain]
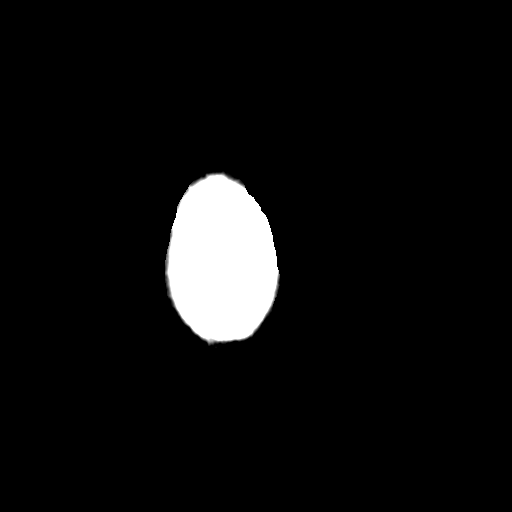

[16 of 30 positions shown; findings below may reference images not displayed]

FINDINGS: There is no evidence of mass effect, midline shift, or extra-axial fluid
collections.  There is no evidence of a space-occupying lesion or
intracranial hemorrhage. There is no evidence of a cortical-based area of
acute infarction.

The ventricles and sulci are appropriate for the patient's age. The basal
cisterns are patent.

Visualized portions of the orbits are unremarkable. There is a right
maxillary sinus mucus retention cyst and the coastal thickening. There is
bilateral ethmoid sinus mucosal thickening.

The osseous structures are unremarkable.
IMPRESSION: No acute intracranial process.

Paranasal sinus disease.

[REDACTED]

## 2013-07-12 IMAGING — CR DG CHEST 1V PORT
1 series · 1 of 1 positions shown · non-contrast
Comparison: none

REASON FOR EXAM: strike
COMMENTS:

[ap]
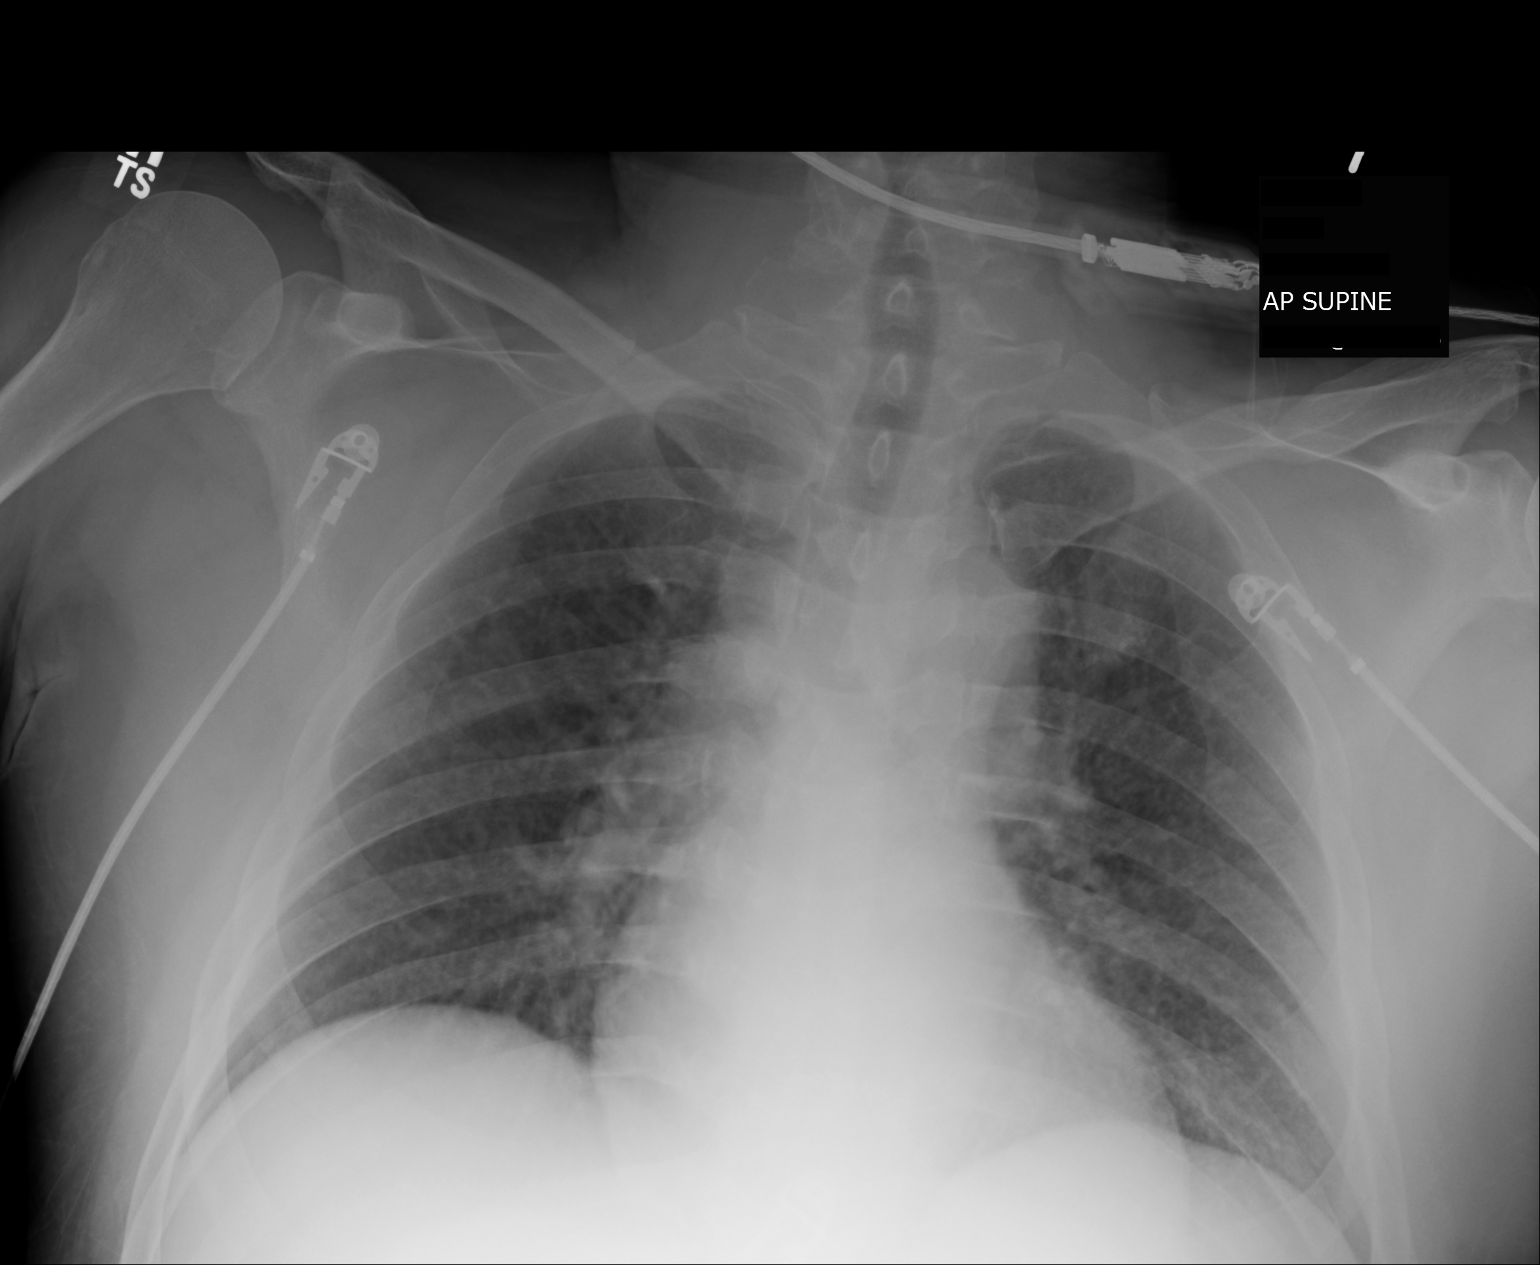

[1 of 1 positions shown; findings below may reference images not displayed]

PROCEDURE:     DXR - DXR PORTABLE CHEST SINGLE VIEW  - September 14, 2012  [DATE]

RESULT:     Comparison is made to the study August 26, 2012.

The lungs are less well inflated today. The cardiac silhouette is top normal
in size. The interstitial markings are more prominent but a portion of this
is related to the AP supine portable technique. The perihilar lung markings
are indistinct.
IMPRESSION: There is no evidence of pneumonia. I cannot exclude very
mild interstitial edema. When the patient can tolerate the procedure, a PA
and lateral chest x-ray would be useful.

[REDACTED]

## 2013-07-13 IMAGING — CR ORBITS FOR FOREIGN BODY - 2 VIEW
1 series · 3 of 3 positions shown · non-contrast
Comparison: none

REASON FOR EXAM: clear for MRI
COMMENTS:

PROCEDURE:     DXR - DXR ORBITS FOR MRI CLEARANCE  - September 15, 2012 [DATE]
RESULT:     Images of the orbits are obtained prior to MRI given the stated
history of a possible retained metallic foreign body. No residual metallic
radiopacity is seen the orbital regions.

[Series 3: x skull ap · 0.14mm/px · 3 of 3 slices shown]
[im 1/3]
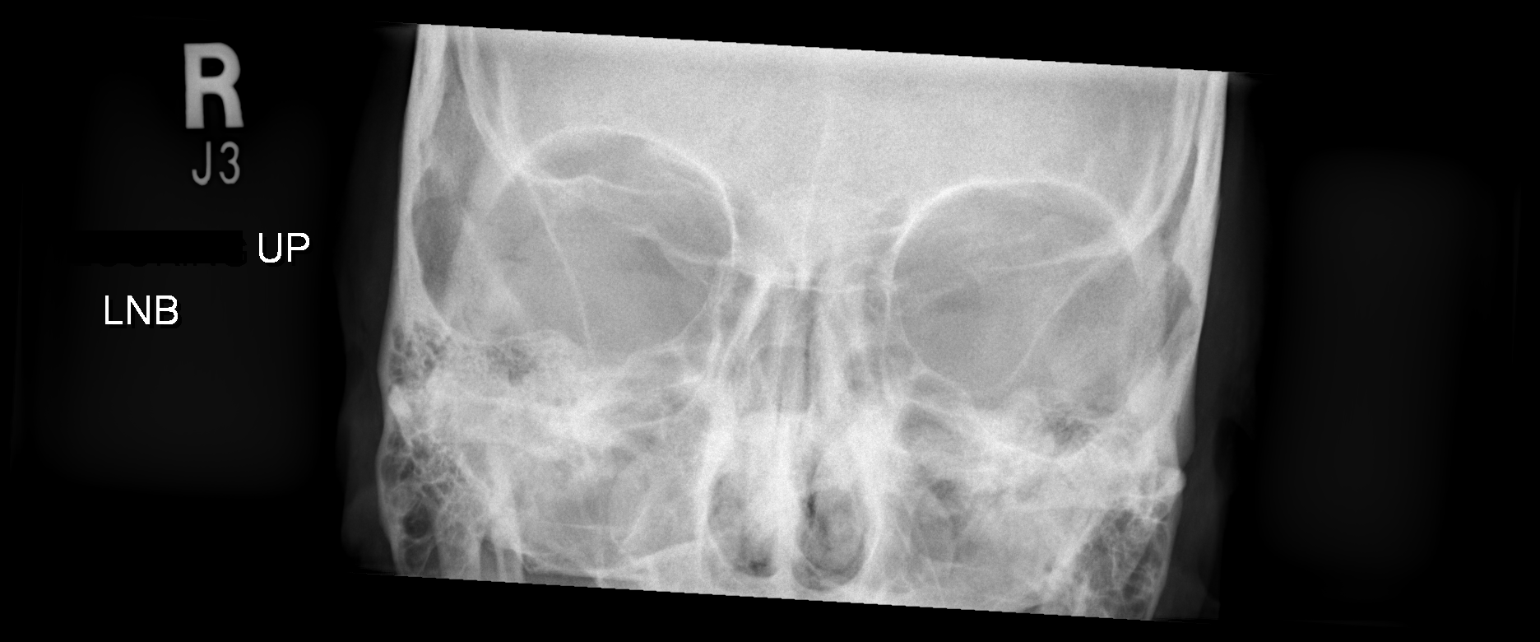
[im 2/3]
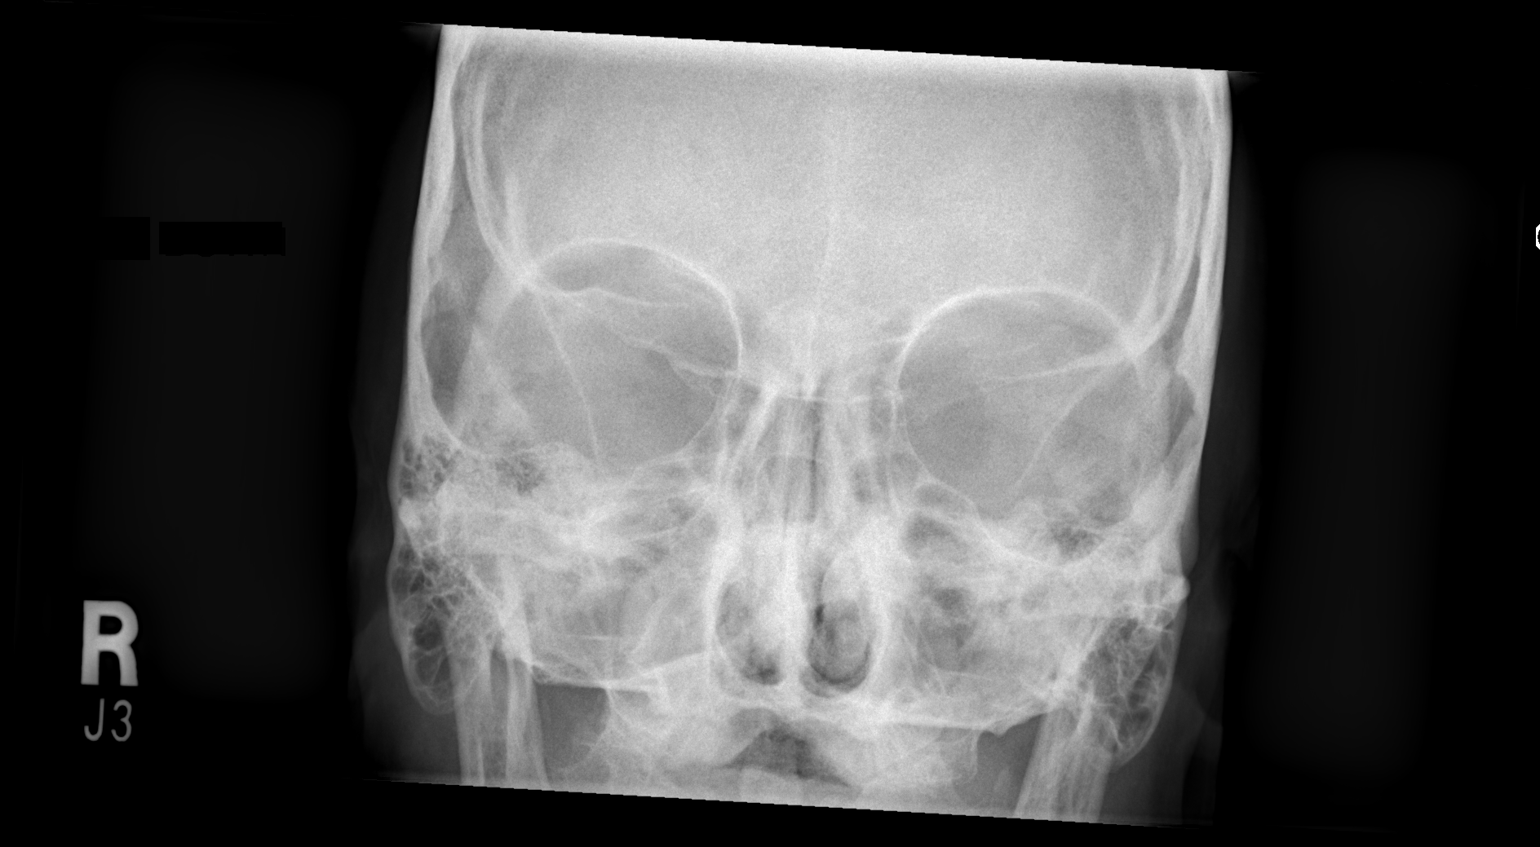
[im 3/3]
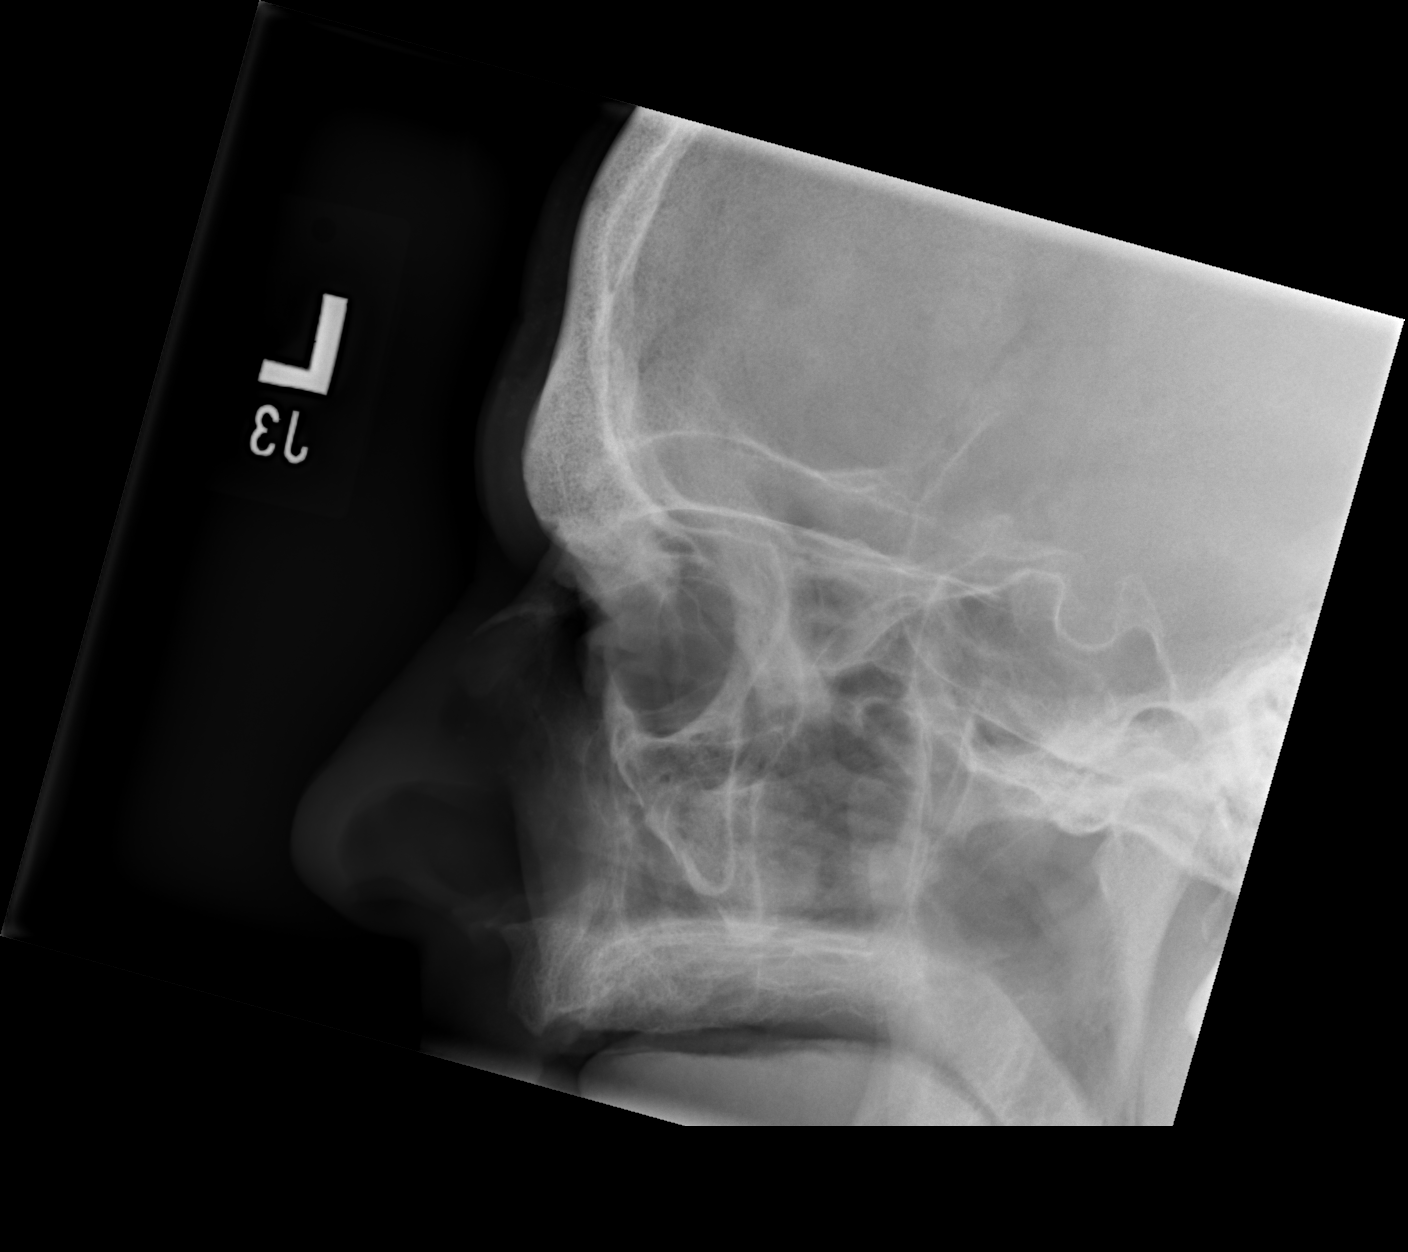

[3 of 3 positions shown; findings below may reference images not displayed]

IMPRESSION: 1. No findings to suggest retained orbital metallic foreign body.

[REDACTED]

## 2013-07-13 IMAGING — US US CAROTID DUPLEX BILAT
1 series · 14 of 24 positions shown · non-contrast
Comparison: none

REASON FOR EXAM: left sided weakness
COMMENTS:

[Series 1: us carotid duplex bilat · 0.07mm/px · 14 of 63 slices shown]
[im 1/63]
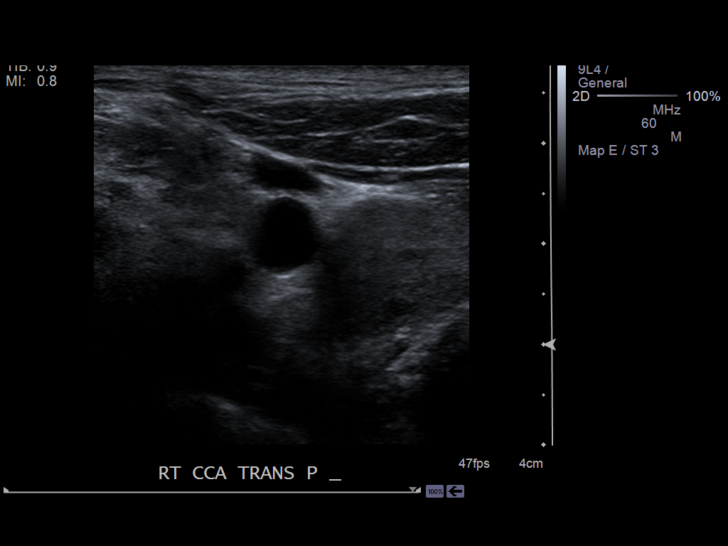
[im 6/63]
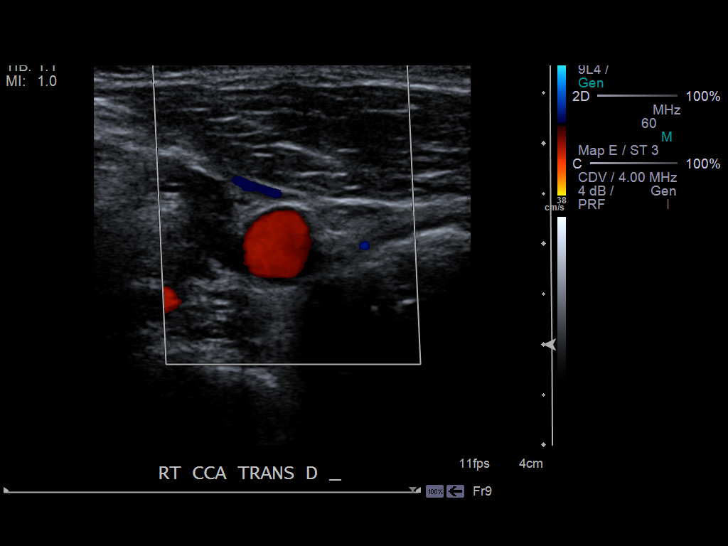
[im 11/63]
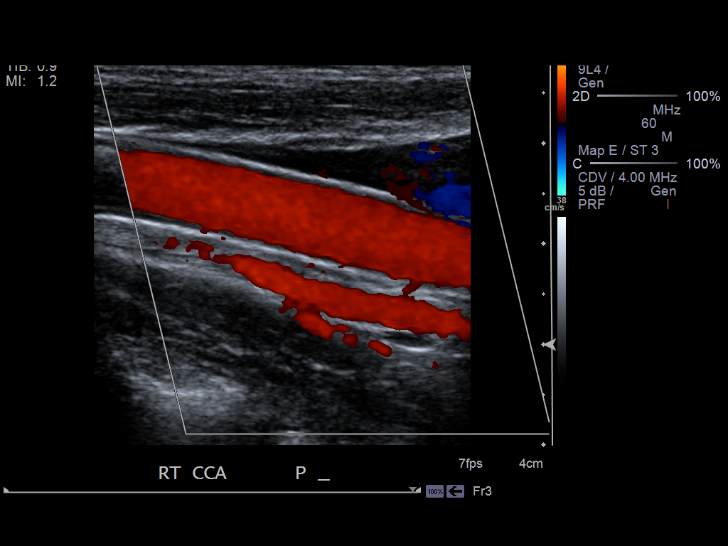
[im 17/63]
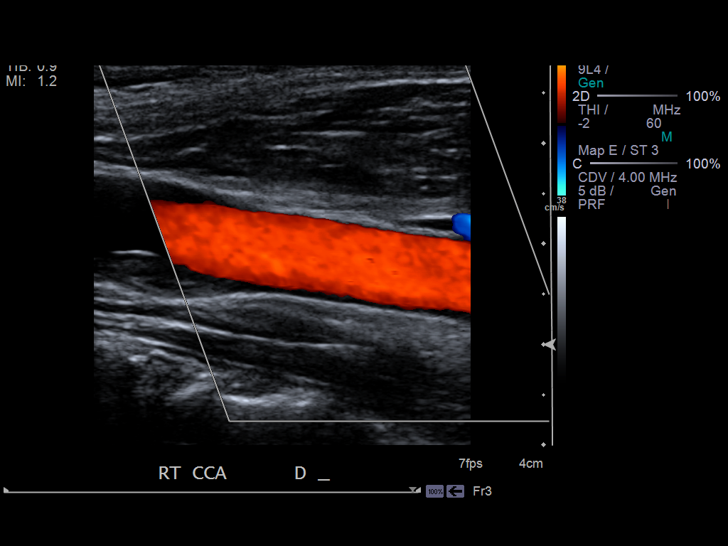
[im 19/63]
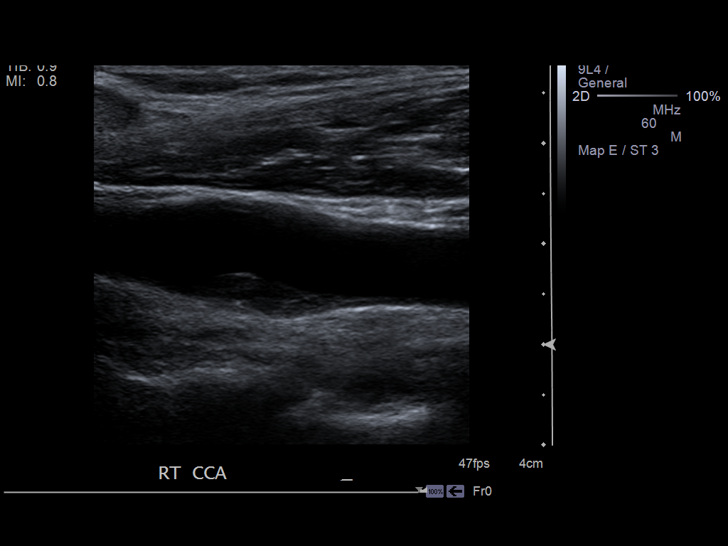
[im 25/63]
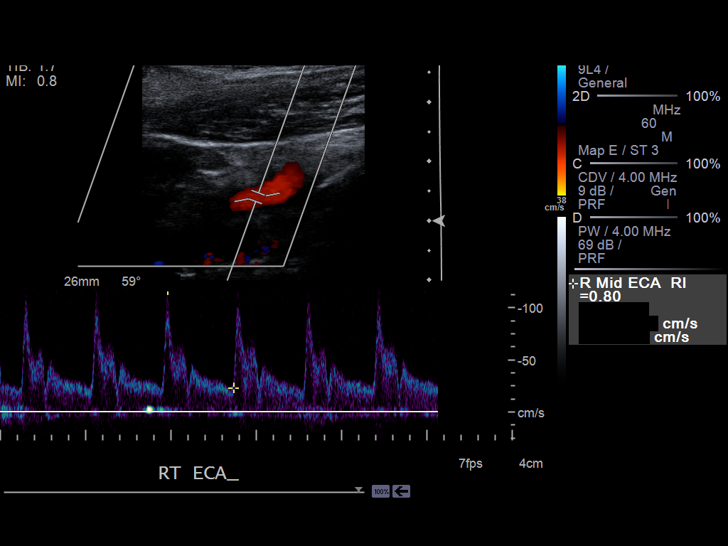
[im 30/63]
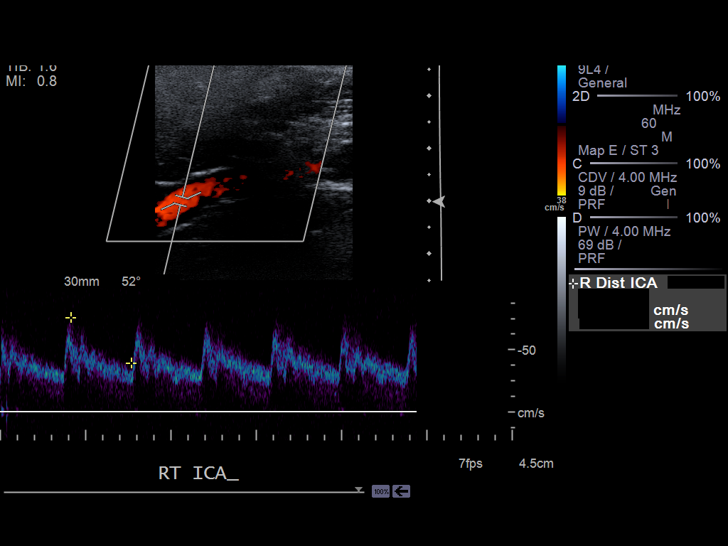
[im 33/63]
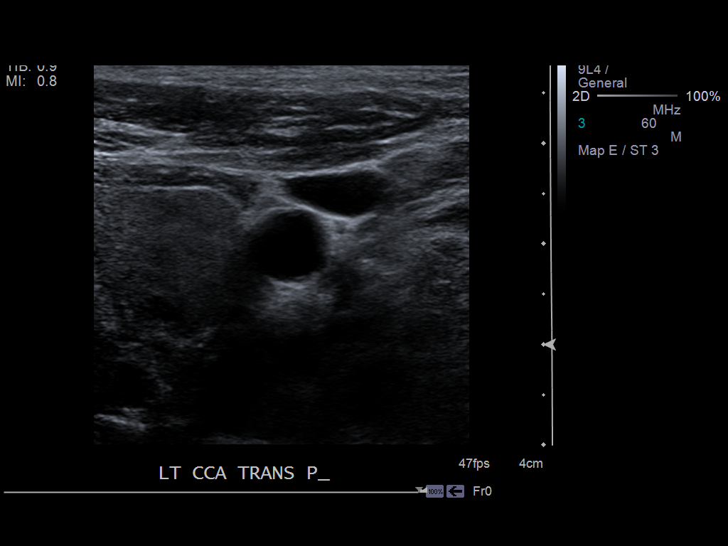
[im 38/63]
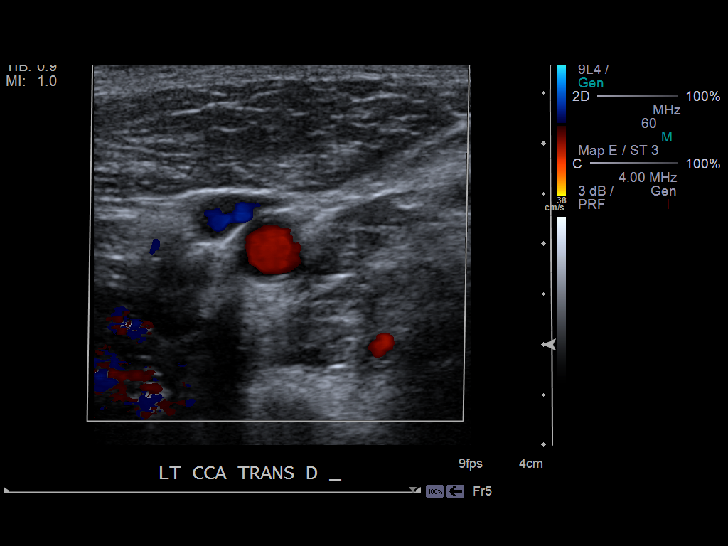
[im 44/63]
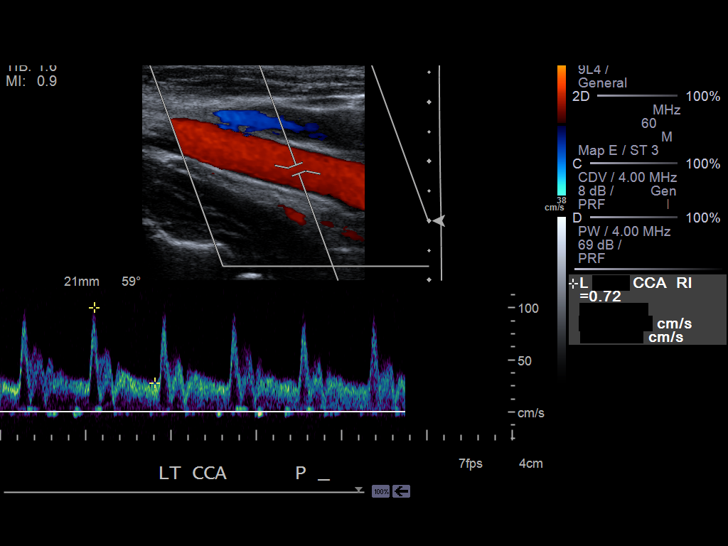
[im 49/63]
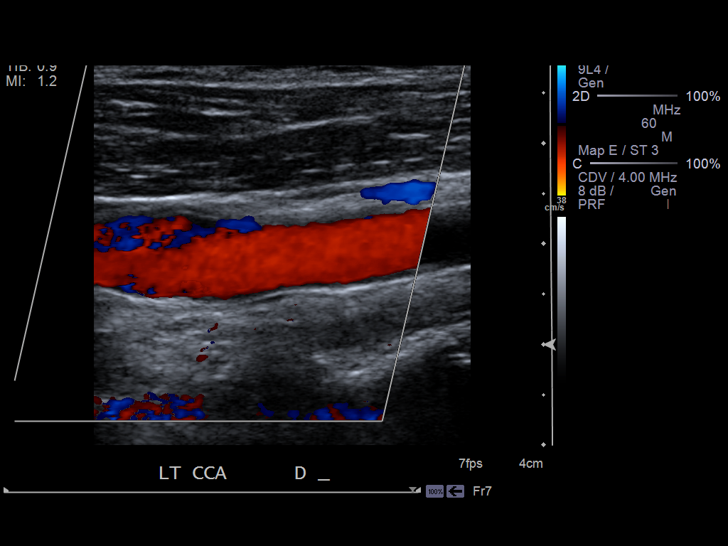
[im 52/63]
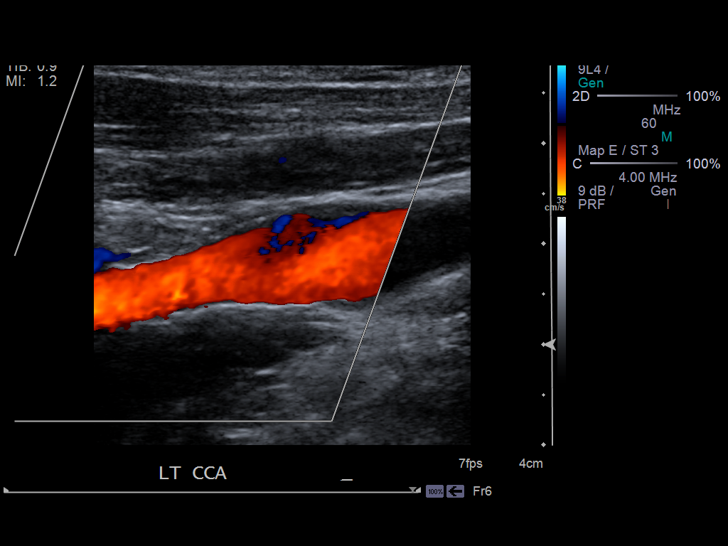
[im 57/63]
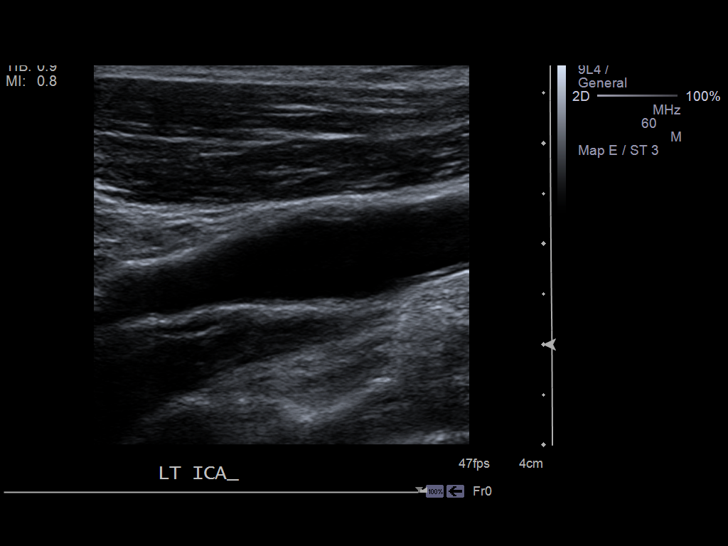
[im 63/63]
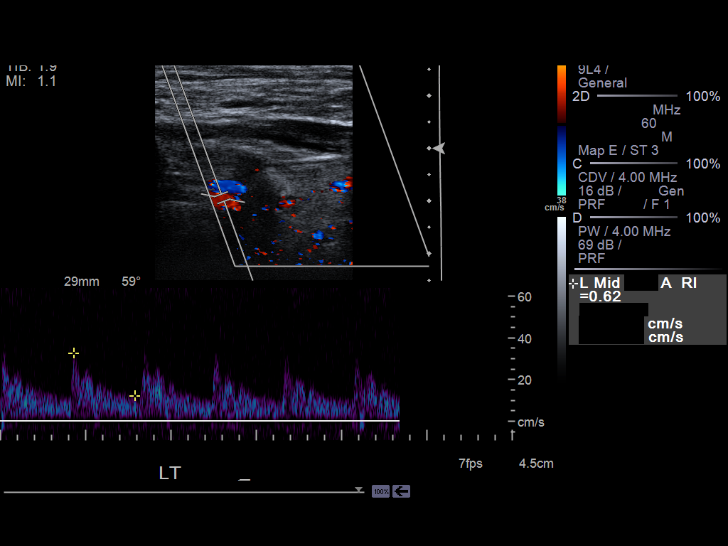

[14 of 24 positions shown; findings below may reference images not displayed]

PROCEDURE:     US  - US CAROTID DOPPLER BILATERAL  - September 15, 2012  [DATE]

RESULT:     Duplex carotid ultrasound with spectral analysis was performed
of the cervical carotid systems.

The color flow images exhibit no evidence of significant turbulence. The
waveform patterns similarly do not suggest significant turbulence. On the
right there is a small amount of soft plaque in the carotid bulb and
proximal ICA. On the left there is soft plaque in the carotid bulb. The peak
internal carotid systolic velocity on the right measured 97 cm per second
and the peak common carotid velocity measured 103 cm per second
corresponding to normal ratio of 0.8. On the left the peak internal carotid
systolic velocity measured 92 cm per second and the peak common carotid
velocity measured 106 cm per second corresponding to normal ratio of 0.9.
The vertebral arteries are normal in flow direction bilaterally.
IMPRESSION: There is no evidence of a hemodynamically significant
carotid stenosis.

[REDACTED]

## 2013-07-13 IMAGING — MR MRI HEAD WITHOUT AND WITH CONTRAST
7 of 9 series · 32 of 48 positions shown · IV contrast (multihance)
Comparison: none

REASON FOR EXAM: CVA
COMMENTS:

PROCEDURE:     MR  - MR BRAIN WO/W CONTRAST  - September 15, 2012  [DATE]
RESULT:
TECHNIQUE: Multiplanar and multisequence imaging of the brain was obtained
pre- and postintravenous administration of 20 mL of IV MultiHance.

[Series 2: t1_se_sag · axial · 10.0mm · 0.55mm/px · 1 of 28 slices shown]
[im 1/28]
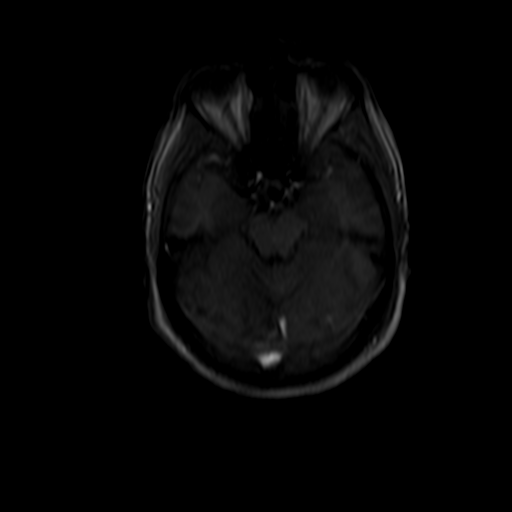

[Series 12: T2 · axial · 5.0mm · 0.47mm/px · z∈[-55,+140]mm · 5 of 26 slices shown]
[im 1/26]
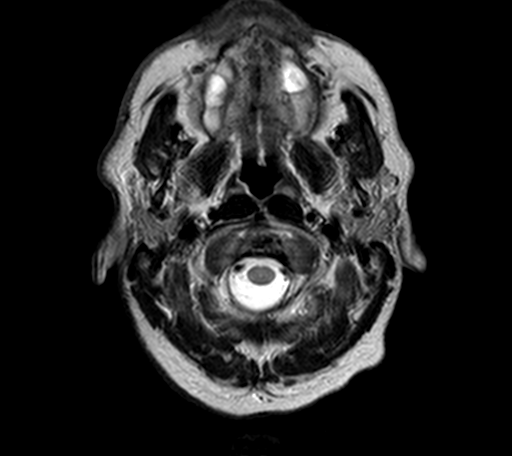
[im 7/26]
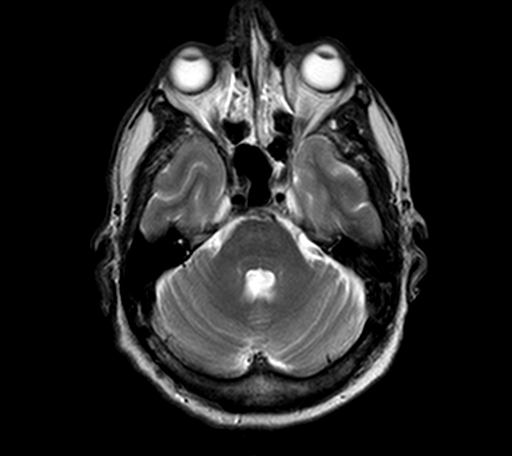
[im 13/26]
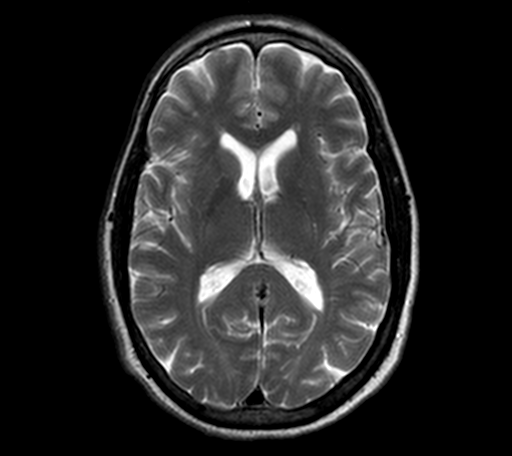
[im 19/26]
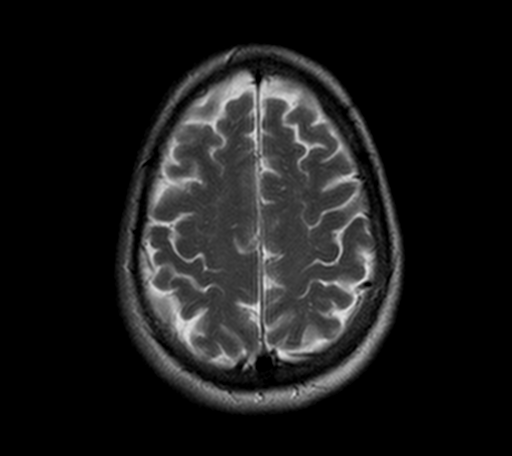
[im 26/26]
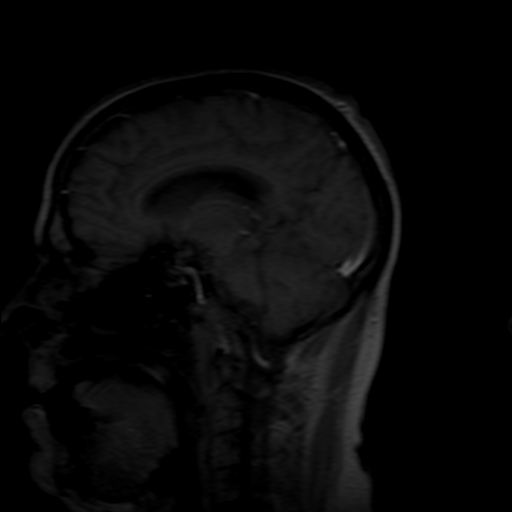

[Series 14: FLAIR · axial · 5.0mm · 0.94mm/px · z∈[-55,+140]mm · 5 of 26 slices shown (1 of 2)]
[im 1/26]
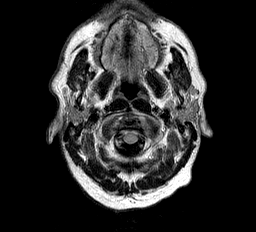
[im 7/26]
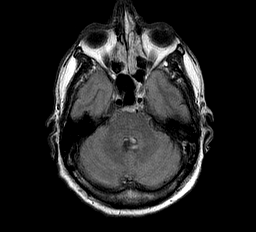
[im 13/26]
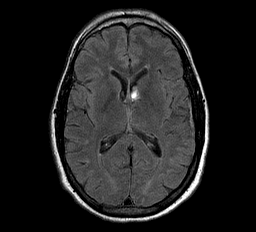
[im 19/26]
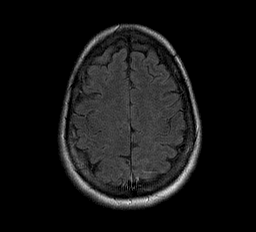
[im 26/26]
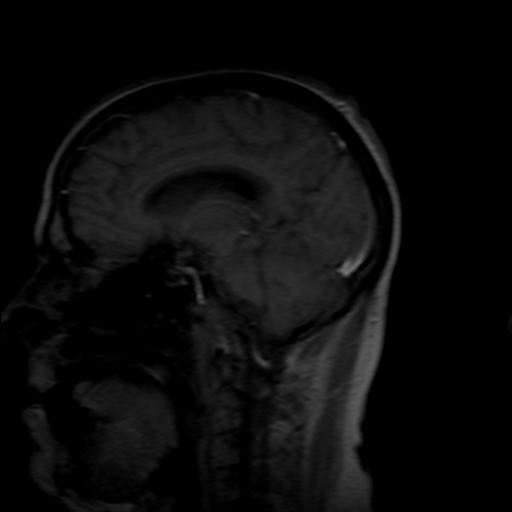

[Series 18: FLAIR · axial · 10.0mm · 0.55mm/px · z∈[+0,+107]mm · 5 of 24 slices shown (2 of 2)]
[im 1/24]
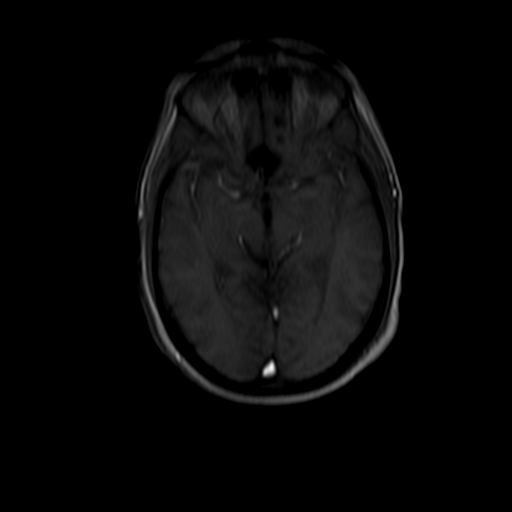
[im 6/24]
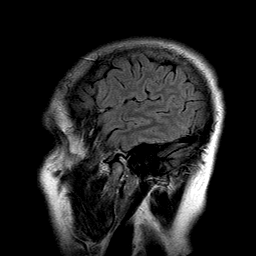
[im 12/24]
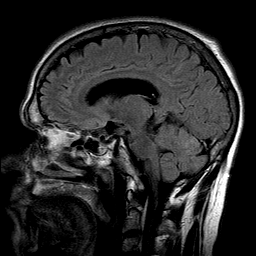
[im 18/24]
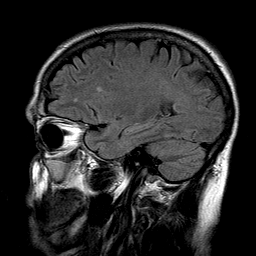
[im 24/24]
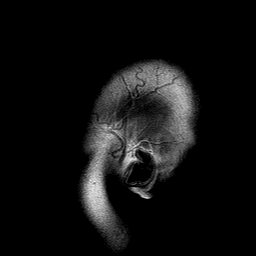

[Series 22: T1 · axial · 5.0mm · 0.45mm/px · z∈[-94,+140]mm · 6 of 28 slices shown]
[im 1/28]
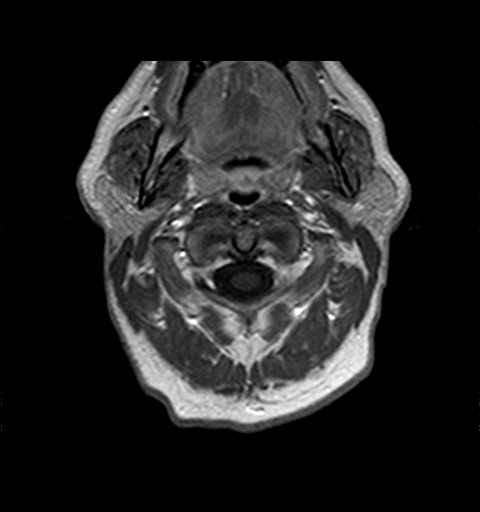
[im 6/28]
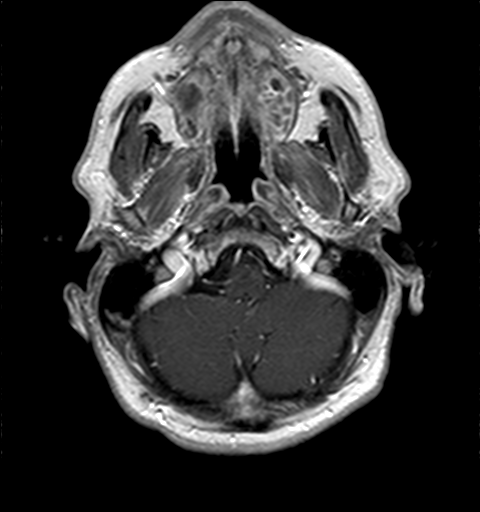
[im 11/28]
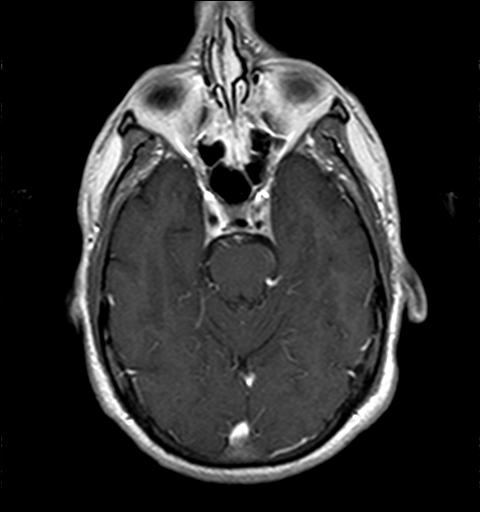
[im 17/28]
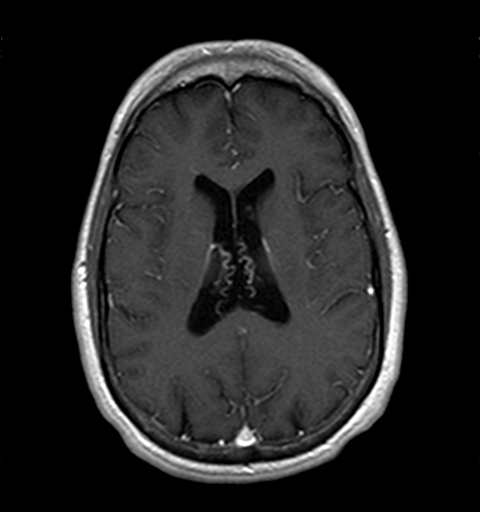
[im 22/28]
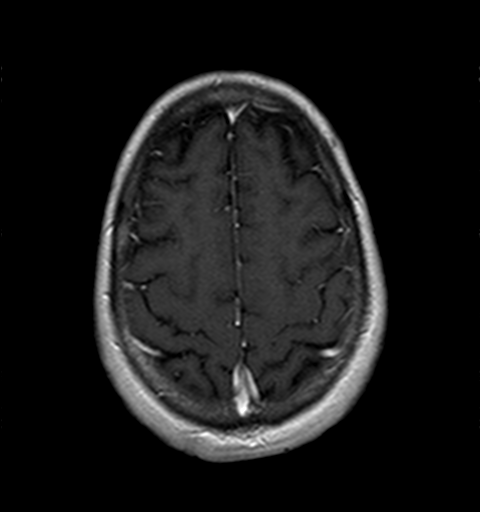
[im 28/28]
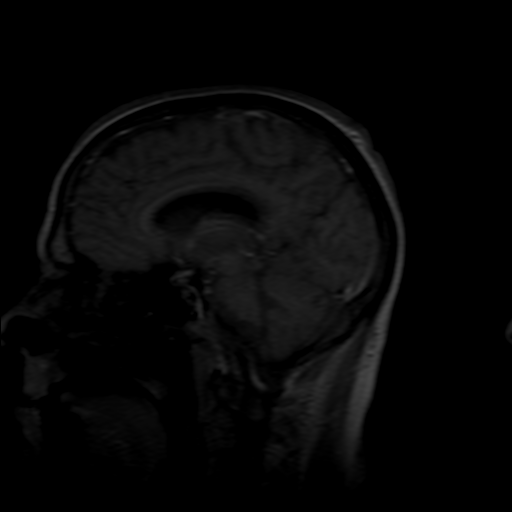

[Series 5002: ADC · axial · 5.0mm · 1.80mm/px · z∈[-55,+140]mm · 5 of 26 slices shown]
[im 1/26]
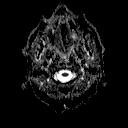
[im 7/26]
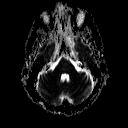
[im 13/26]
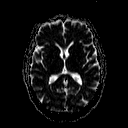
[im 19/26]
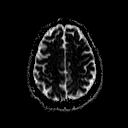
[im 26/26]
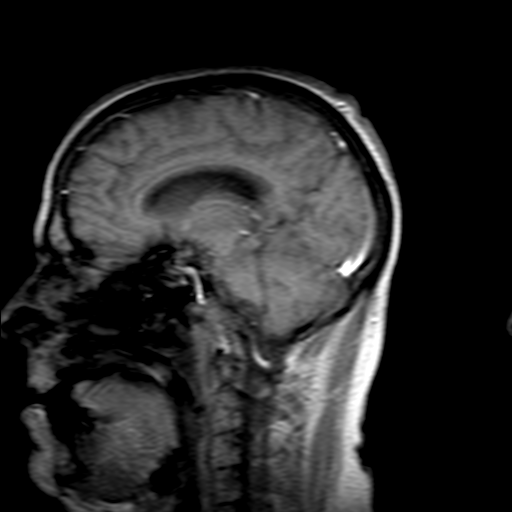

[Series 5003: DWI · axial · 5.0mm · 1.80mm/px · z∈[-55,+140]mm · 5 of 25 slices shown]
[im 1/25]
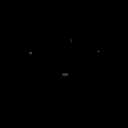
[im 7/25]
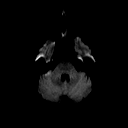
[im 13/25]
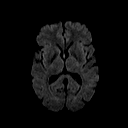
[im 19/25]
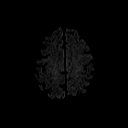
[im 25/25]
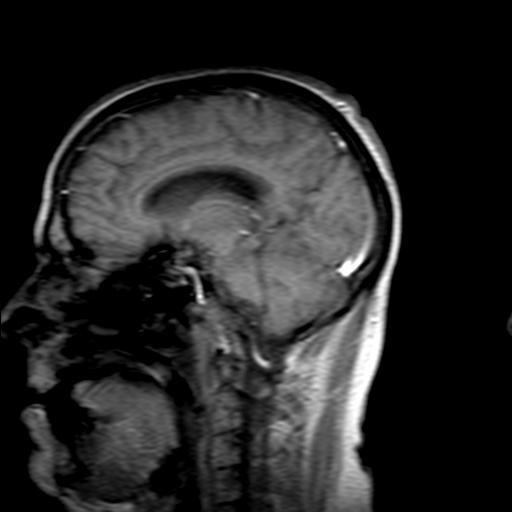

[32 of 48 positions shown; findings below may reference images not displayed]

FINDINGS: The diffusion weighted imaging demonstrates no evidence of
abnormal foci of restricted diffusion. There is no evidence of abnormal
parenchymal enhancement or enhancing masses nor nodules. There is no
evidence of intra-axial nor extra-axial fluid collections. An area of
increased signal projects within the left lateral ventricle and third
ventricle on the axial FLAIR weighted sequences. This finding is consistent
with flow artifact and is only appreciated on the single axial FLAIR
sequence. The sella and parasellar regions and structures as well as the
cerebellopontine angle regions and visualized portions of the 7th and 8th
cranial nerve demonstrate no signal or enhancement abnormalities. There is
diffuse mucosal thickening within the maxillary sinuses and ethmoid air
cells. A loculated area of increased T2 signal projects in the anterior base
of the right maxillary sinus.

The pons, cerebellum and midbrain demonstrate no signal nor enhancement
abnormalities.
IMPRESSION: Findings which may represent the sequela of sinus disease.
Otherwise, unremarkable brain MRI.

## 2013-07-14 IMAGING — MR MRI CERVICAL SPINE WITHOUT CONTRAST
6 series · 34 of 48 positions shown · IV contrast (gadolinium)
Comparison: none

REASON FOR EXAM: include flair, unable to move left leg
COMMENTS:

PROCEDURE:     MR  - MR CERVICAL SPINE WO CONT  - September 16, 2012  [DATE]
RESULT:
TECHNIQUE: Multiplanar and multisequence imaging of the cervical spine was
obtained without the administration of gadolinium.

[Series 2: T2 · sagittal · 3.0mm · 0.81mm/px · 6 of 16 slices shown (1 of 2)]
[im 1/16]
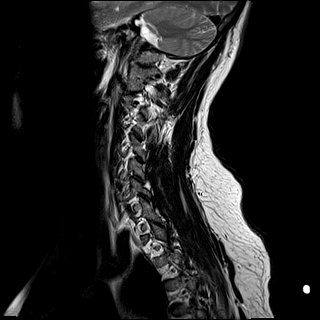
[im 4/16]
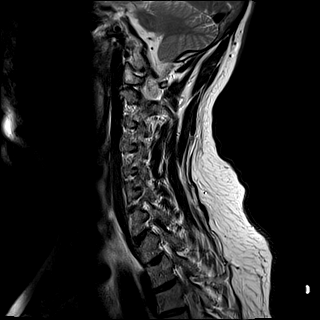
[im 7/16]
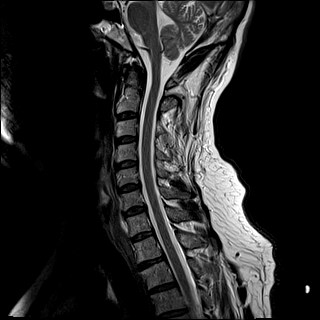
[im 10/16]
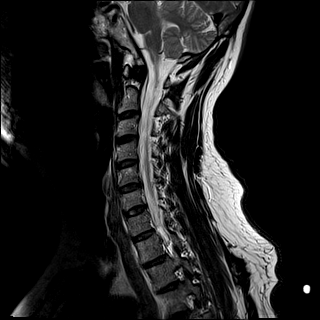
[im 13/16]
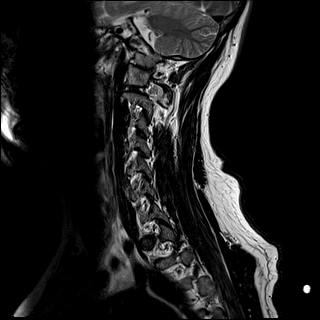
[im 16/16]
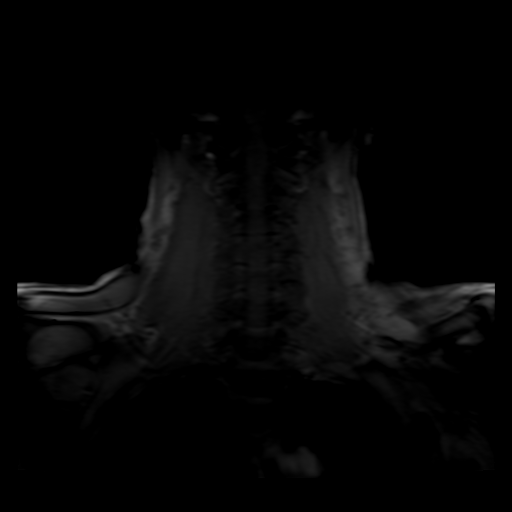

[Series 3: FLAIR · sagittal · 3.0mm · 0.51mm/px · 6 of 16 slices shown]
[im 1/16]
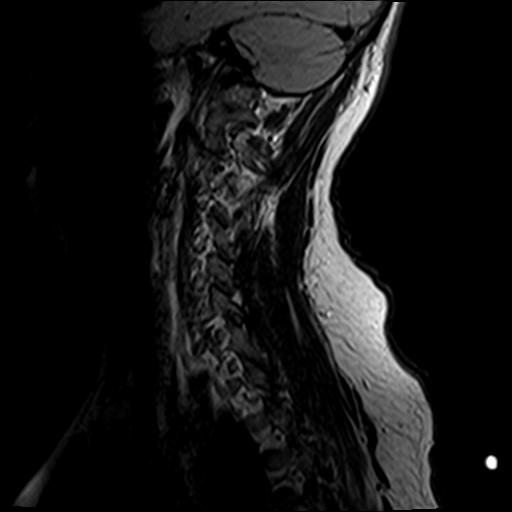
[im 4/16]
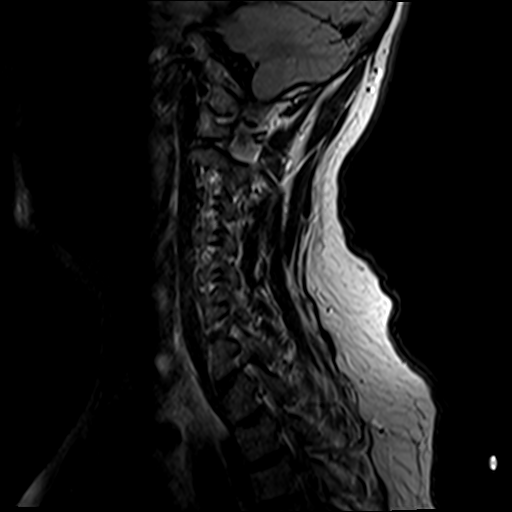
[im 7/16]
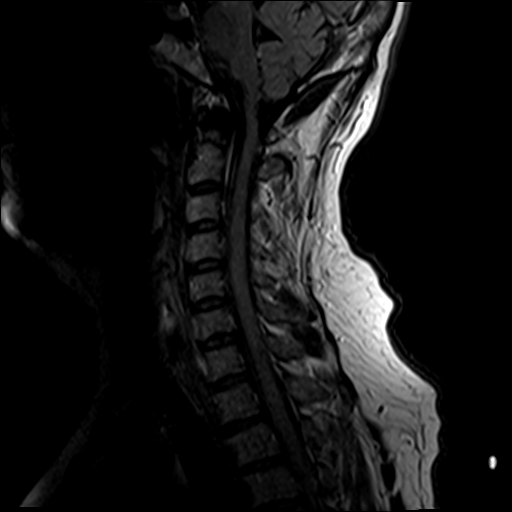
[im 10/16]
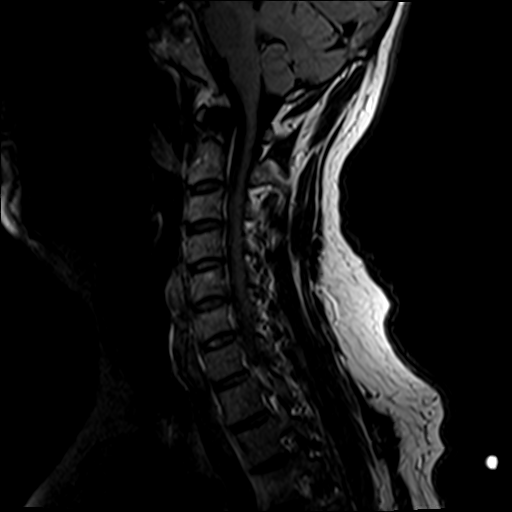
[im 13/16]
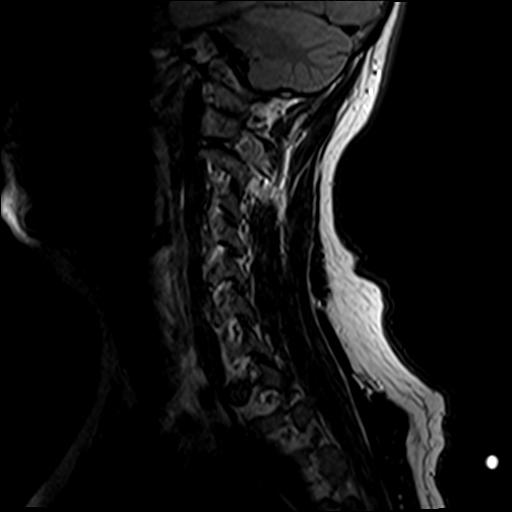
[im 16/16]
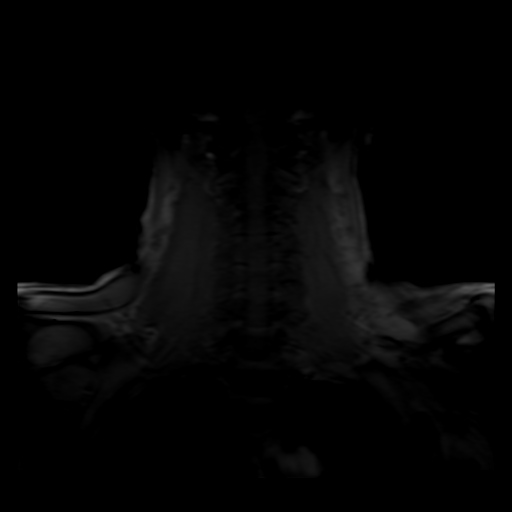

[Series 4: T1 · sagittal · 3.0mm · 0.68mm/px · 6 of 16 slices shown]
[im 1/16]
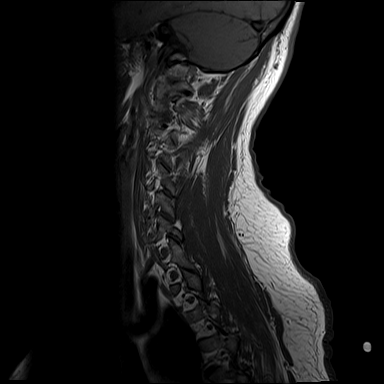
[im 4/16]
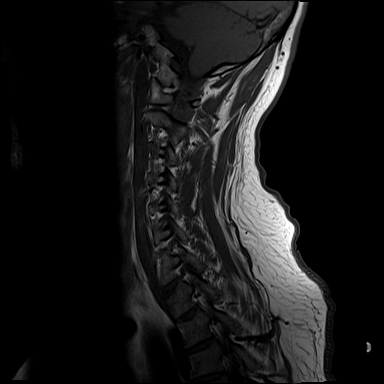
[im 7/16]
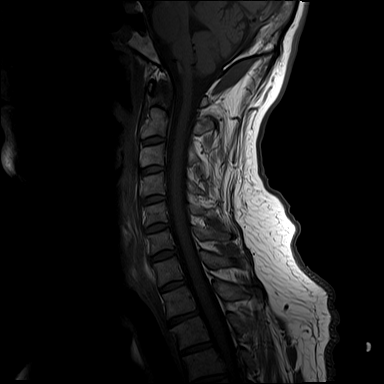
[im 10/16]
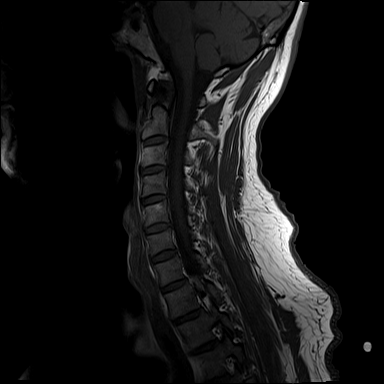
[im 13/16]
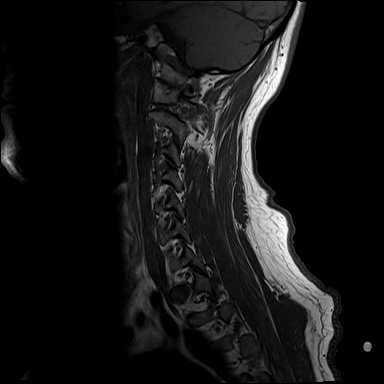
[im 16/16]
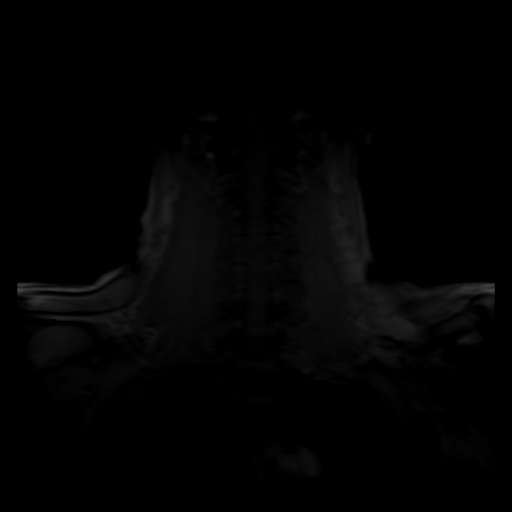

[Series 5: STIR · sagittal · 3.0mm · 1.02mm/px · 6 of 16 slices shown]
[im 1/16]
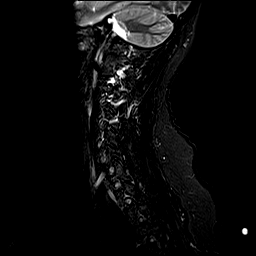
[im 4/16]
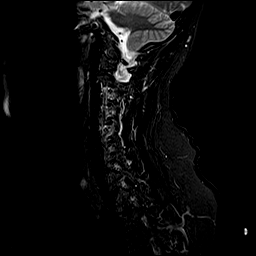
[im 7/16]
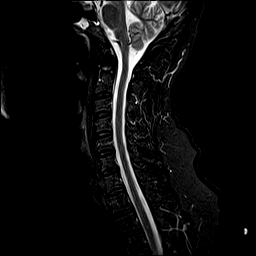
[im 10/16]
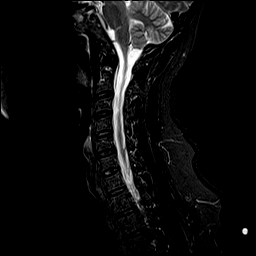
[im 13/16]
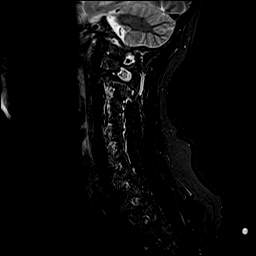
[im 16/16]
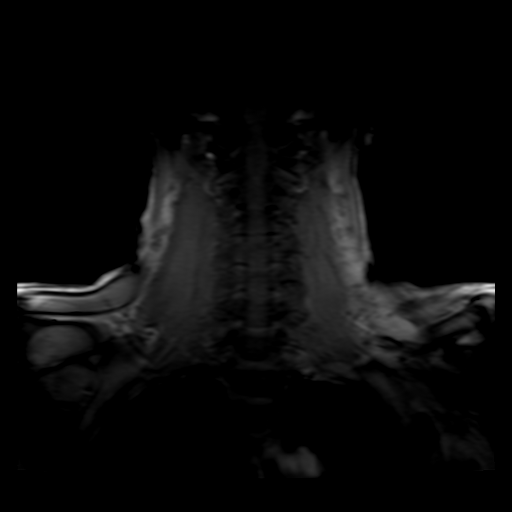

[Series 6: t2_medic 2d_ax_p2 · axial · 3.0mm · 0.35mm/px · 1 of 31 slices shown]
[im 1/31]
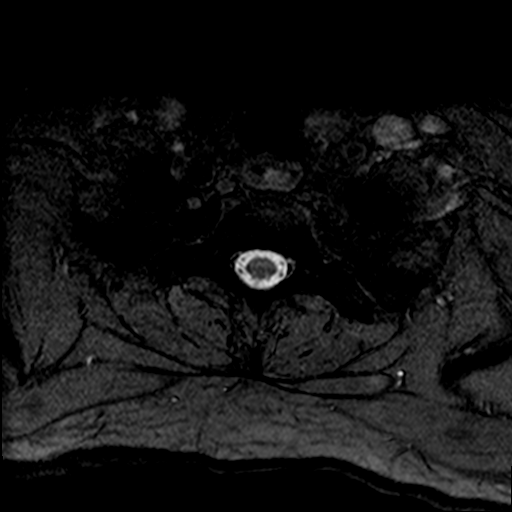

[Series 7: T2 · axial · 3.0mm · 0.70mm/px · z∈[-101,+112]mm · 9 of 31 slices shown (2 of 2)]
[im 1/31]
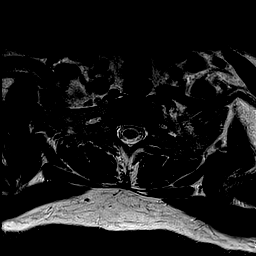
[im 6/31]
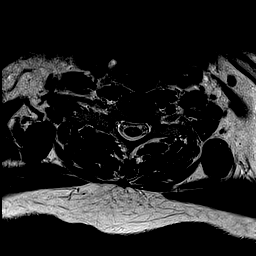
[im 9/31]
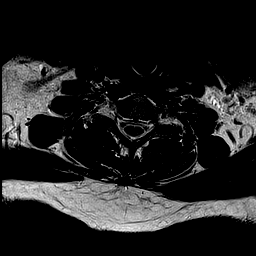
[im 14/31]
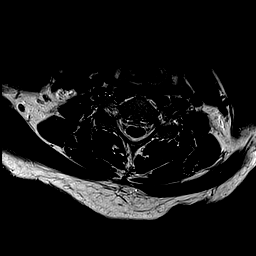
[im 17/31]
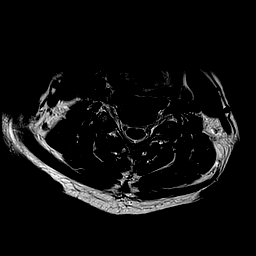
[im 22/31]
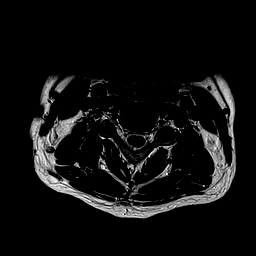
[im 25/31]
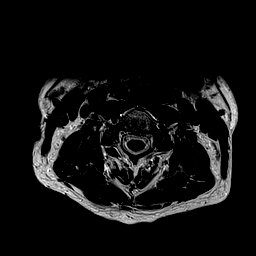
[im 28/31]
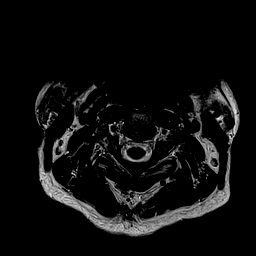
[im 31/31]
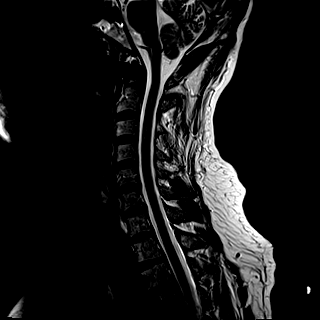

[34 of 48 positions shown; findings below may reference images not displayed]

The cervical cord demonstrates no T1 or T2 signal abnormalities.

The craniocervical junction is intact.

At the C2-C3, C3-C4, C4-C5, C5-C6, C6-C7, C7-T1 levels, there is no evidence
of thecal sac stenosis nor neural foraminal narrowing. There is no evidence
of marrow edema nor linear signal void to suggest fracture.
IMPRESSION: 1. Unremarkable cervical MRI as described above.

## 2013-07-14 IMAGING — CR PELVIS - 1-2 VIEW
1 series · 1 of 1 positions shown · non-contrast
Comparison: none

REASON FOR EXAM: pain left groin
COMMENTS:

PROCEDURE:     DXR - DXR PELVIS AP ONLY  - September 16, 2012  [DATE]
RESULT:     The bony pelvis is adequately mineralized. There is no evidence
of an acute fracture. The hips exhibit no acute abnormality. The soft
tissues of the pelvis are normal in appearance.

[ap]
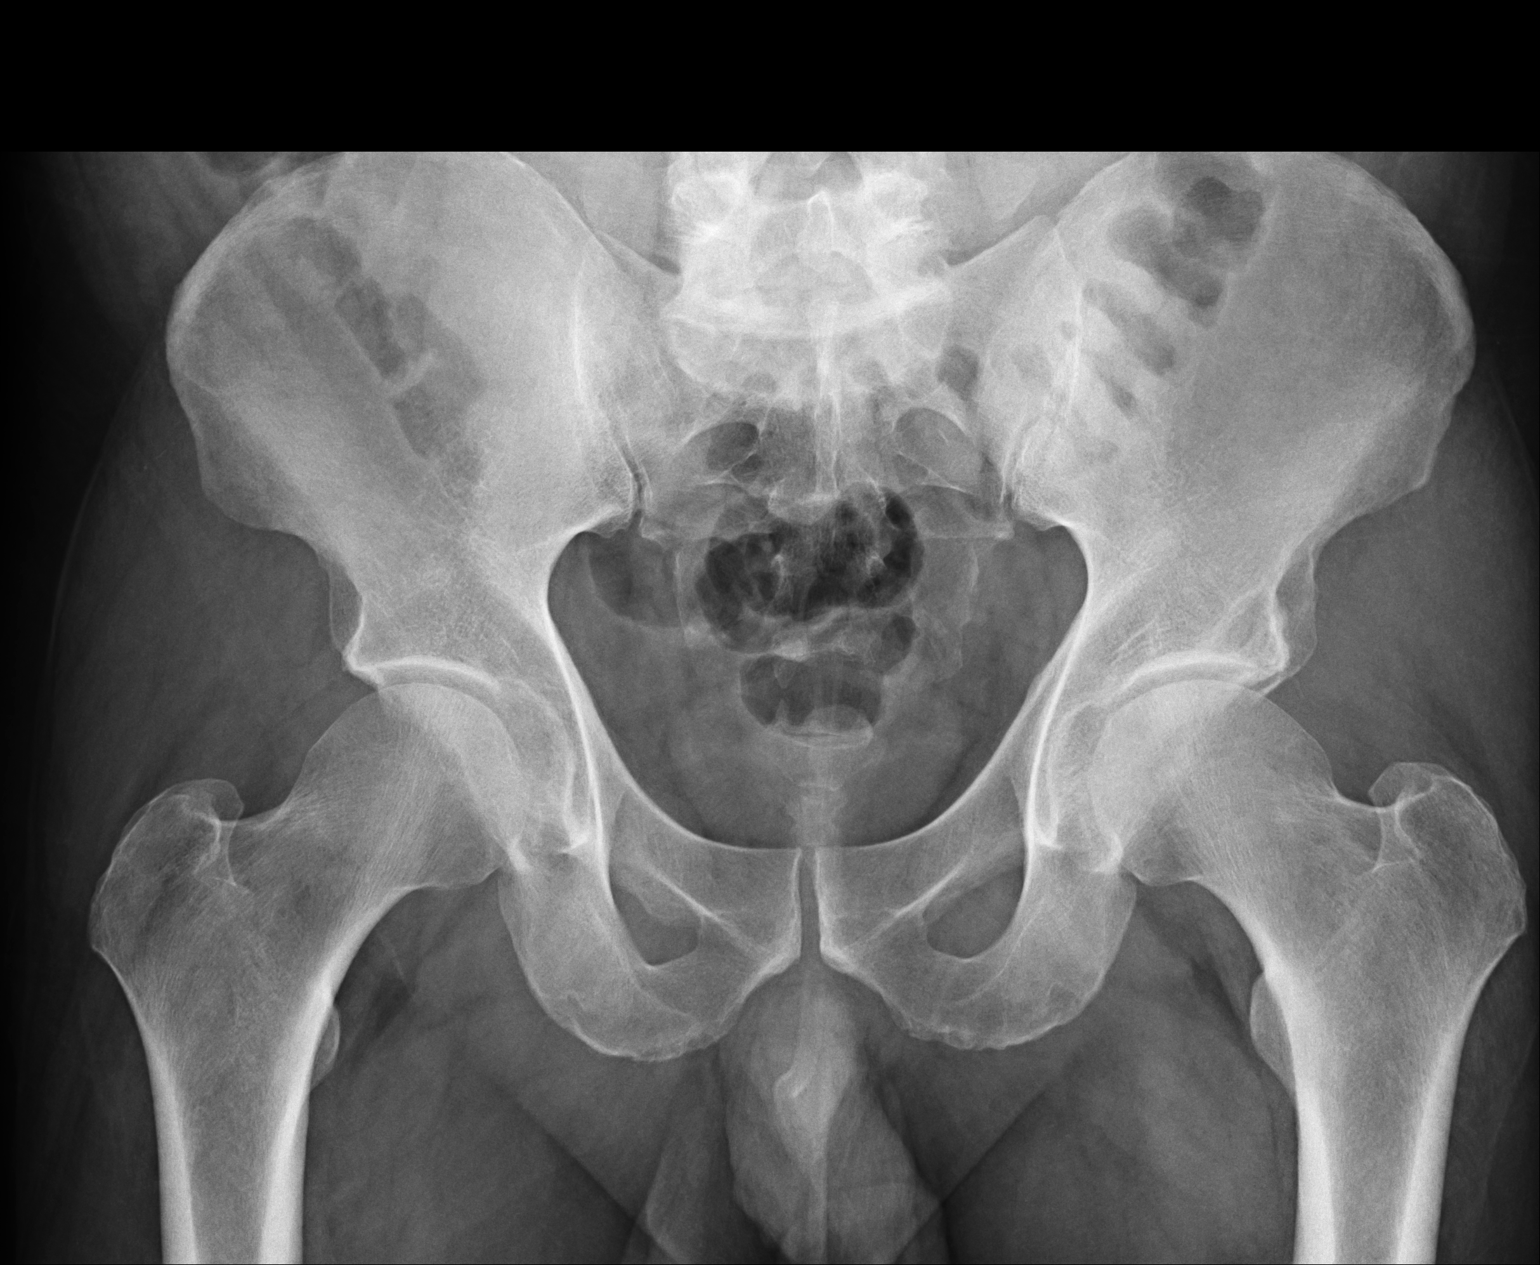

[1 of 1 positions shown; findings below may reference images not displayed]

IMPRESSION: There is no acute bony abnormality of the pelvis.

[REDACTED]

## 2013-07-14 IMAGING — CR DG HIP COMPLETE 2+V*L*
1 series · 2 of 2 positions shown · non-contrast
Comparison: none

REASON FOR EXAM: pain left groin
COMMENTS:

PROCEDURE:     DXR - DXR HIP LEFT COMPLETE  - September 16, 2012  [DATE]
RESULT:     AP and lateral views of the left hip reveal the bones to be
adequately mineralized. The joint spaces reasonably well-maintained. No
acute fracture of the left hemipelvis nor of the femur is demonstrated.

[Series 1: ap · 0.17mm/px · 2 of 2 slices shown]
[im 1/2]
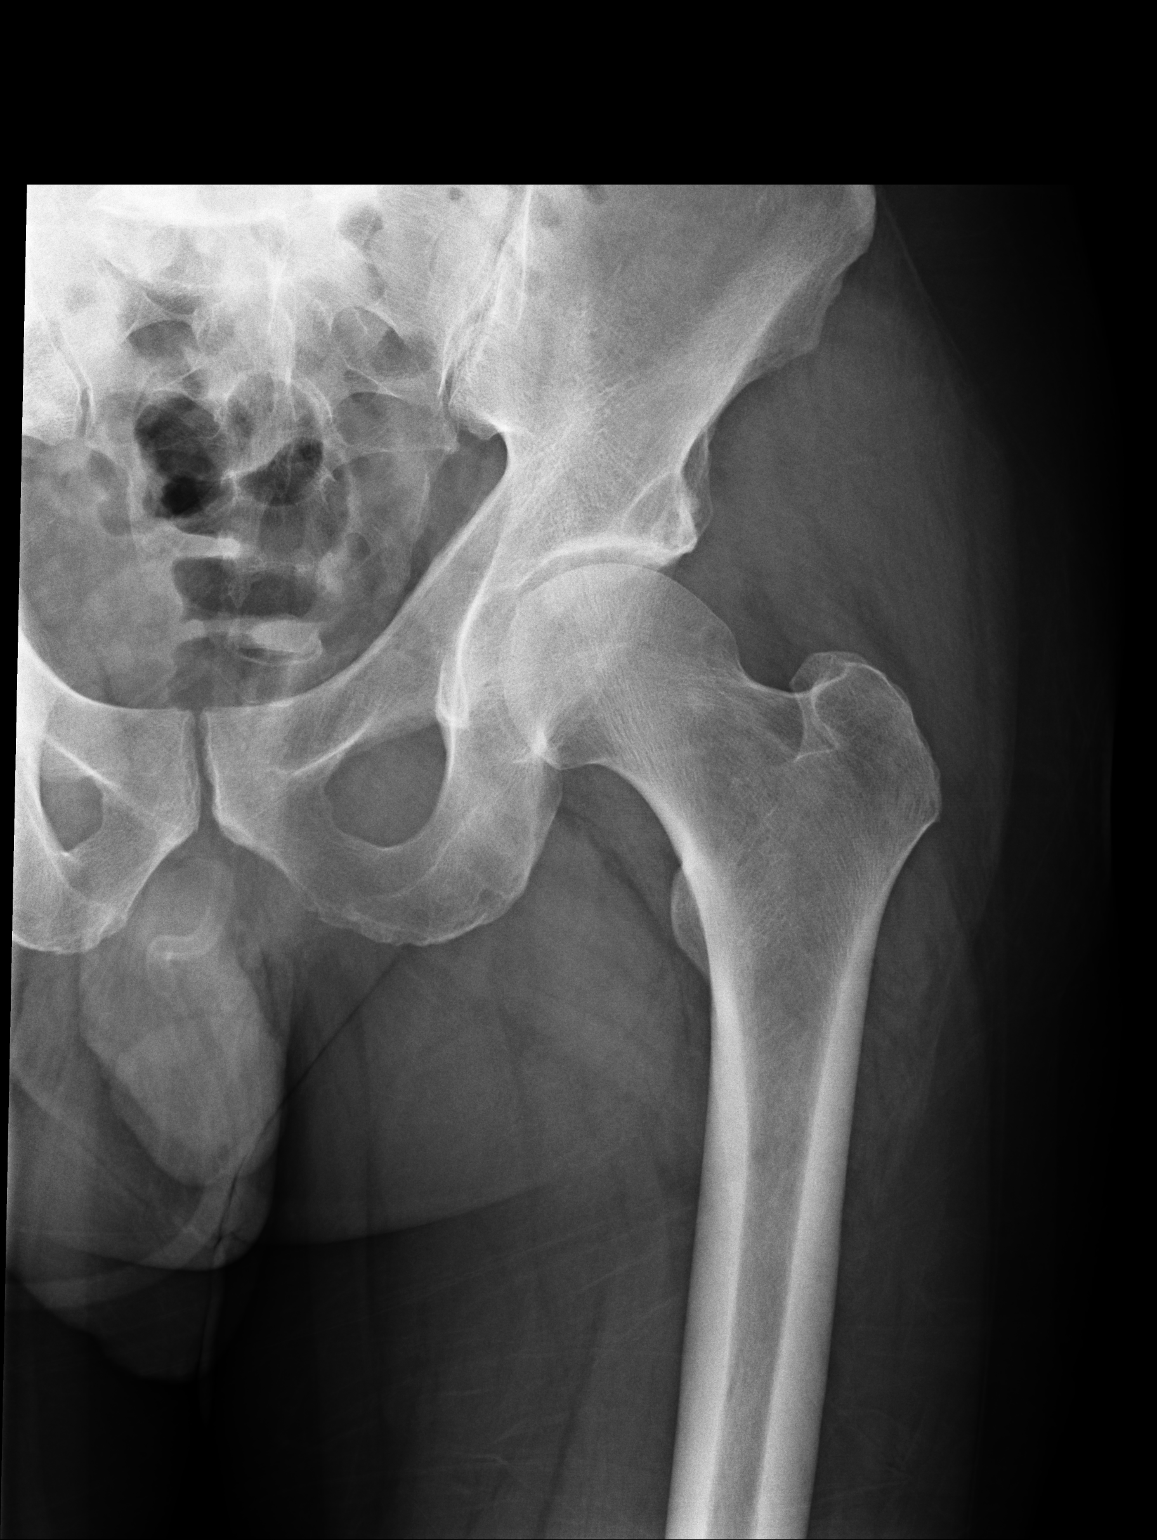
[im 2/2]
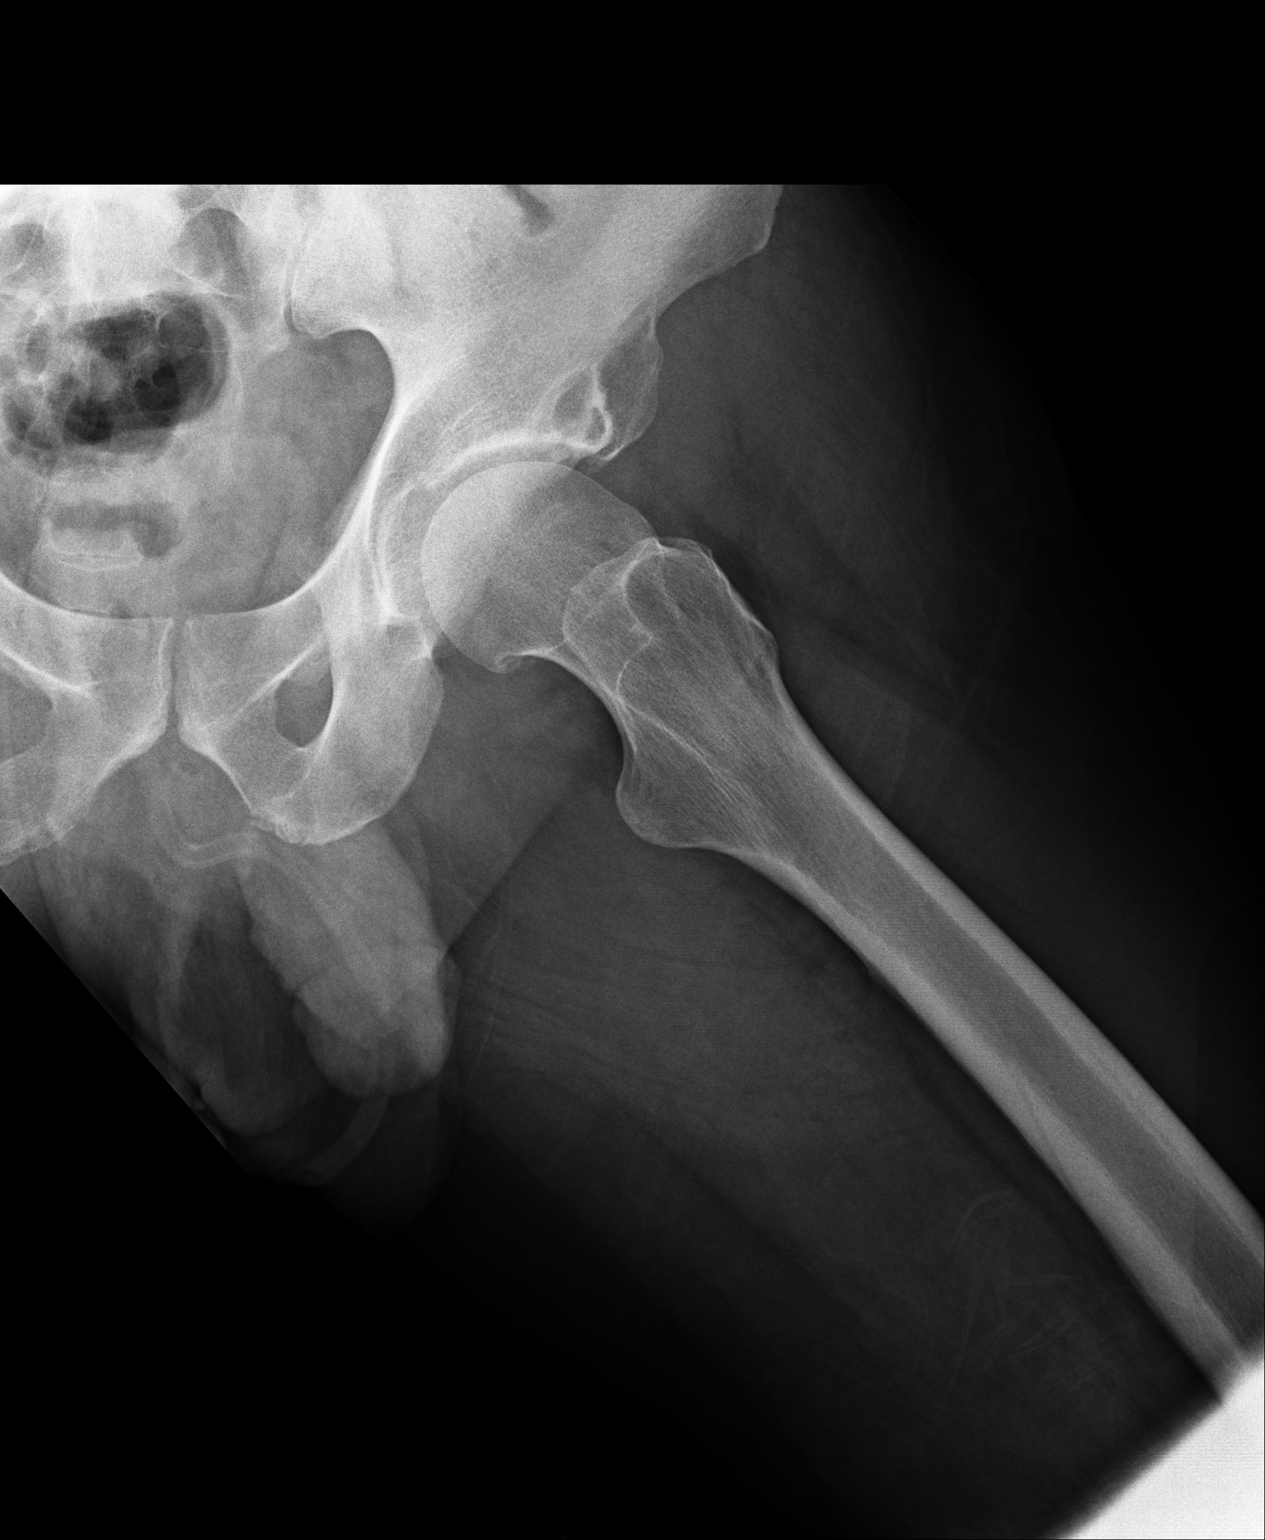

[2 of 2 positions shown; findings below may reference images not displayed]

IMPRESSION: There is no acute bony abnormality of the left.

[REDACTED]

## 2013-07-16 IMAGING — MR MRI OF THE LEFT HIP WITHOUT CONTRAST
4 series · 23 of 40 positions shown · non-contrast
Comparison: No

REASON FOR EXAM: evaluate for occult hip fracture
COMMENTS:

PROCEDURE:     MR  - MR HIP LT  WO CONTRAST  - September 18, 2012 [DATE]
RESULT:     MRI of the Bilateral Hips
HISTORY: Occult hip fracture
TECHNIQUE: Multiplanar and multisequence MR imaging of the hips was
performed without contrast.

[Series 3: t1_se_cor · axial · 10.0mm · 0.91mm/px · z∈[+39,+202]mm · 6 of 36 slices shown]
[im 1/36]
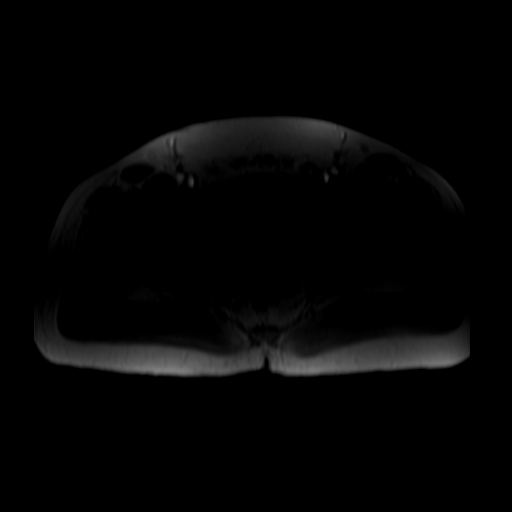
[im 5/36]
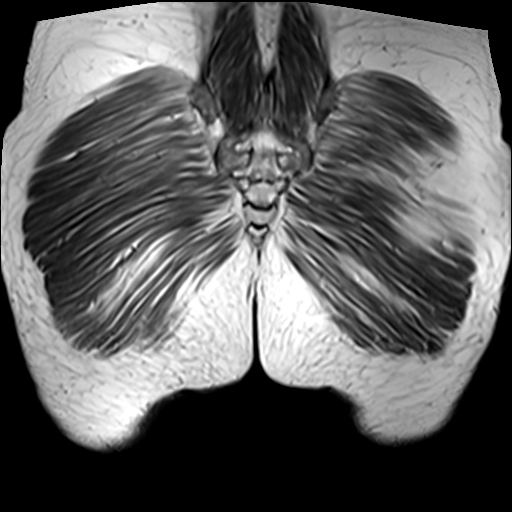
[im 9/36]
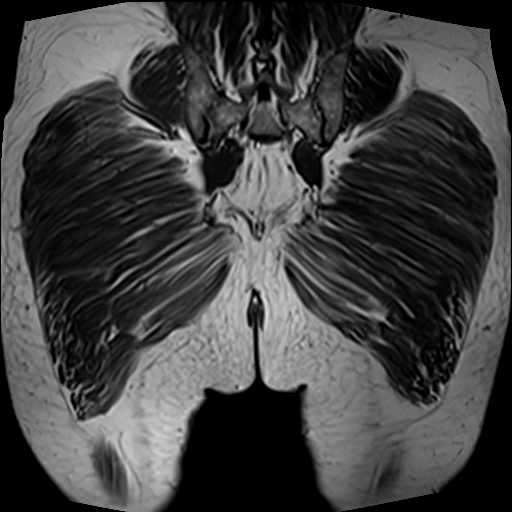
[im 14/36]
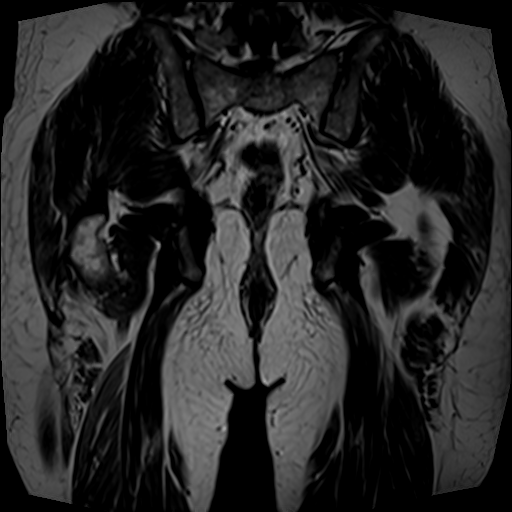
[im 18/36]
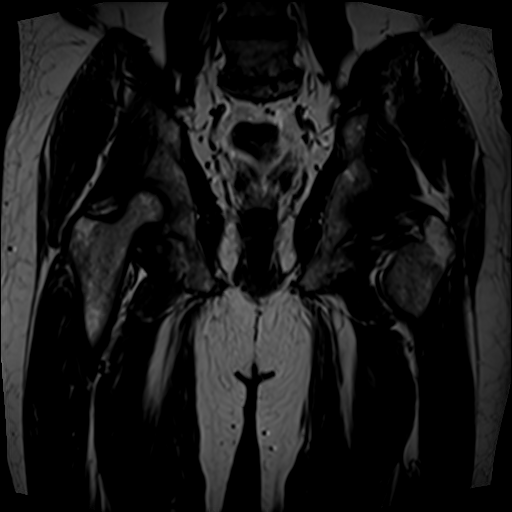
[im 31/36]
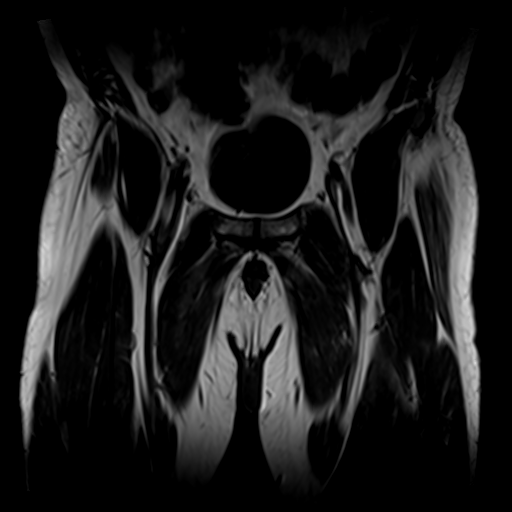

[Series 4: T2 · coronal · 4.0mm · 0.78mm/px · 3 of 36 slices shown]
[im 5/36]
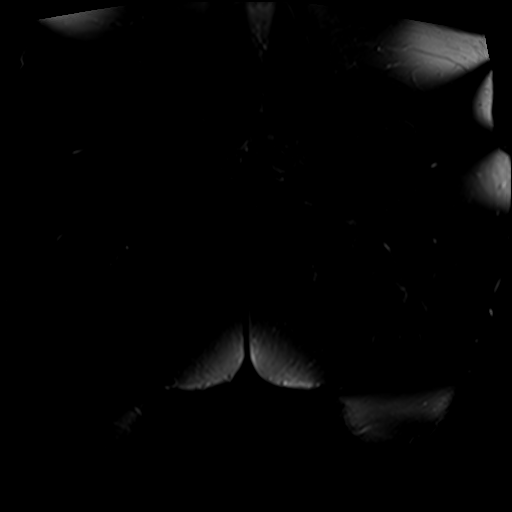
[im 18/36]
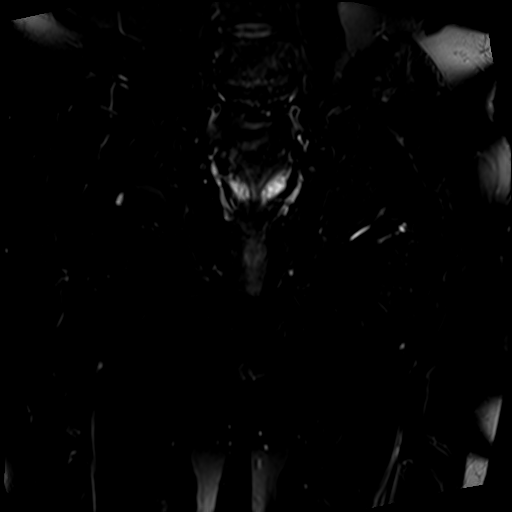
[im 31/36]
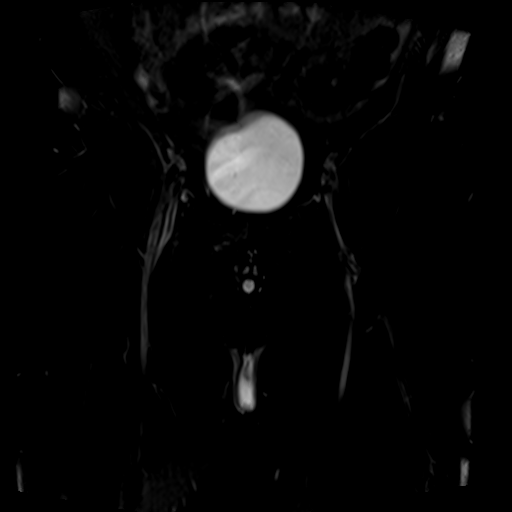

[Series 5: T2 fat-sat · axial · 4.0mm · 0.76mm/px · z∈[-74,+230]mm · 11 of 46 slices shown]
[im 1/46]
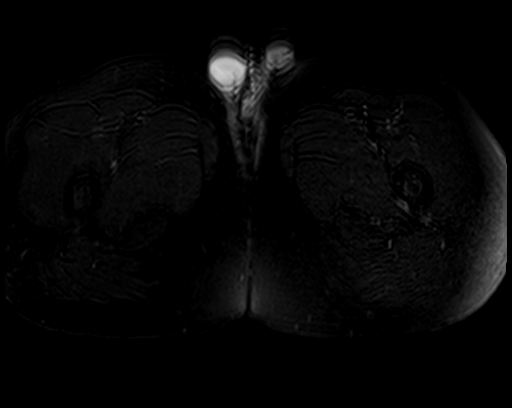
[im 5/46]
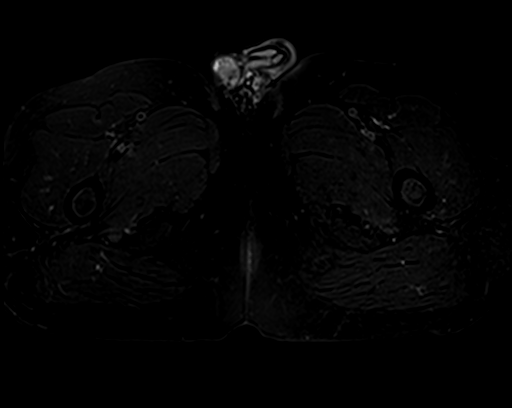
[im 10/46]
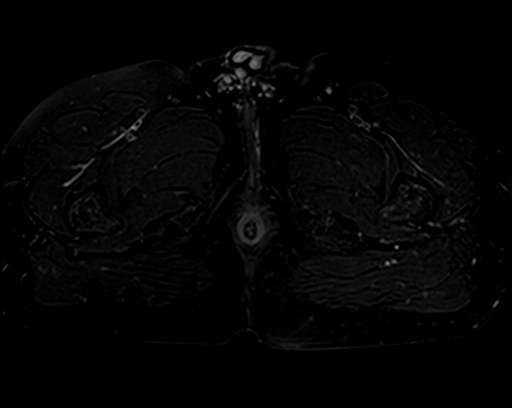
[im 14/46]
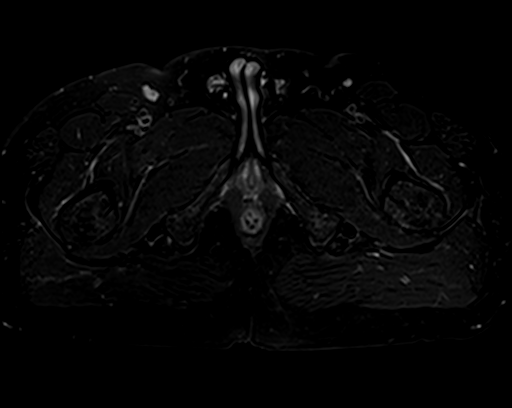
[im 19/46]
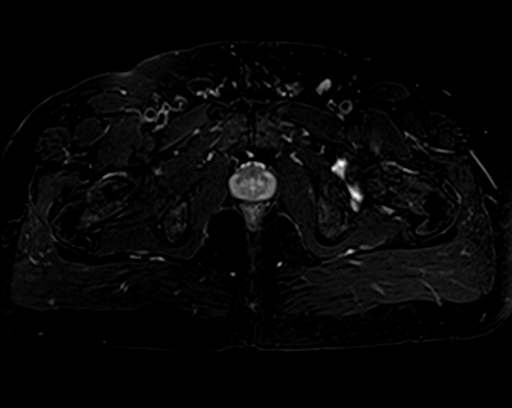
[im 23/46]
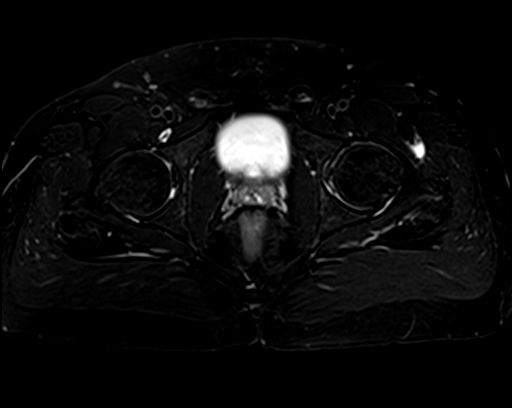
[im 28/46]
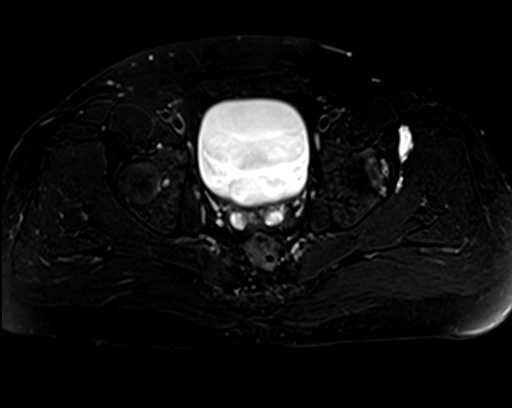
[im 32/46]
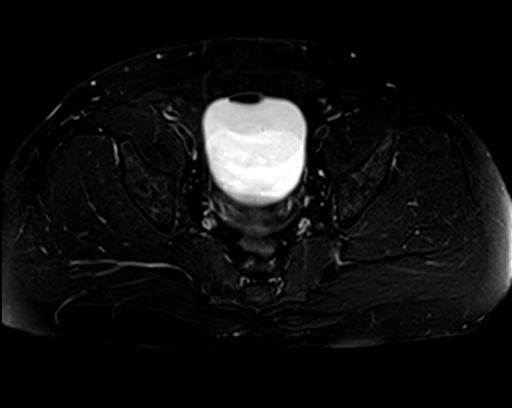
[im 37/46]
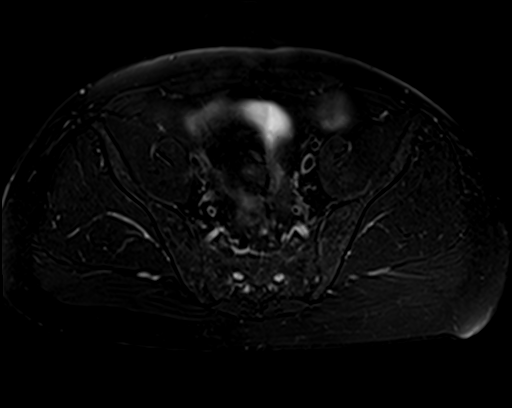
[im 41/46]
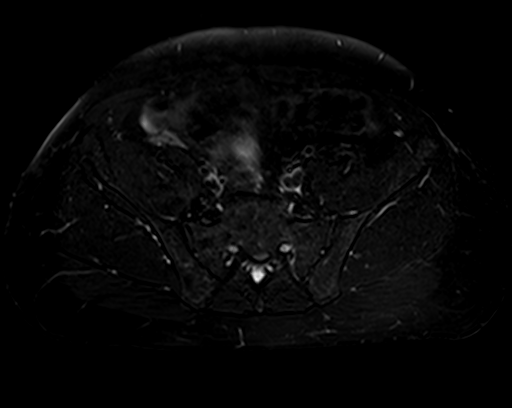
[im 46/46]
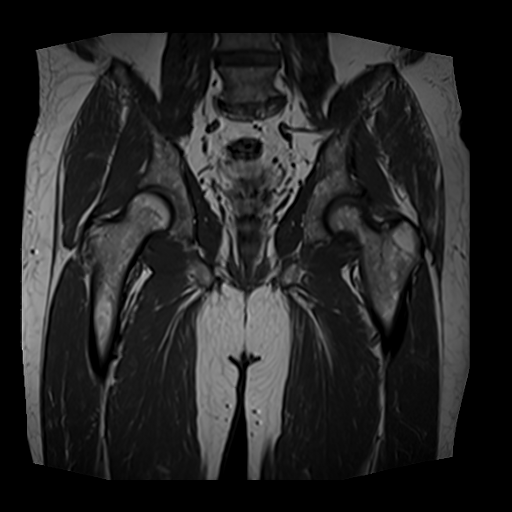

[Series 6: _t1_se_axial · axial · 4.0mm · 1.22mm/px · z∈[-54,+126]mm · 3 of 46 slices shown]
[im 5/46]
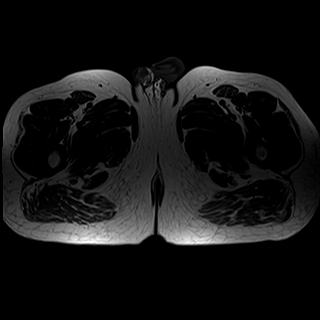
[im 23/46]
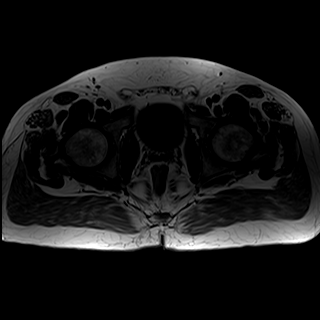
[im 41/46]
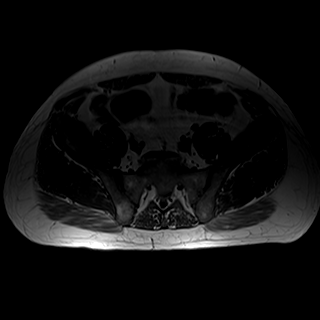

[23 of 40 positions shown; findings below may reference images not displayed]

FINDINGS: No fracture or evidence of avascular necrosis is identified.  There mild
degenerative changes of the left hip. Alignment is anatomic.  No edema or
effusion is seen.   The sacrum and sacroiliac joints are within normal
limits.  There is a small fluid collection adjacent to the lateral aspect of
the left rectus in which may represent a bursitis or ganglion cyst. There is
a left superior labral tear with a paralabral cyst present.

The study is otherwise unremarkable.
IMPRESSION: 1.  No AVN or fracture seen.

2.  Small fluid collection adjacent to the lateral aspect of the left rectus
in which may represent a bursitis or ganglion cyst.

3. Left superior labral tear with a paralabral cyst present.

4. Mild degenerative changes of the left hip.

[REDACTED]

## 2013-07-16 IMAGING — MR MRI LUMBAR SPINE WITHOUT CONTRAST
5 series · 37 of 48 positions shown · non-contrast
Comparison: None

REASON FOR EXAM: asssess for lumbar disc herniation
COMMENTS:

PROCEDURE:     MR  - MR LUMBAR SPINE WO CONTRAST  - September 18, 2012 [DATE]
RESULT:     MRI LUMBAR SPINE WITHOUT CONTRAST
HISTORY: Lumbar pain
TECHNIQUE: Multiplanar and multisequence MRI of the lumbar spine were
obtained, without administration of IV contrast.

[Series 4: T2 · sagittal · 4.0mm · 0.94mm/px · 5 of 16 slices shown (1 of 2)]
[im 1/16]
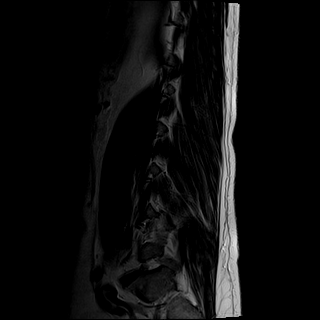
[im 4/16]
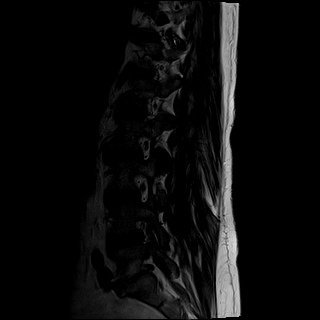
[im 8/16]
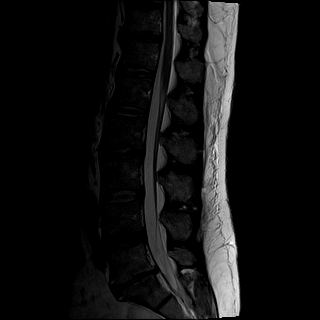
[im 12/16]
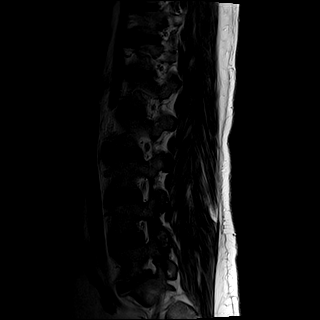
[im 16/16]
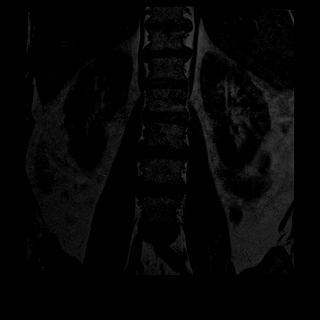

[Series 5: T1 · sagittal · 4.0mm · 0.94mm/px · 5 of 16 slices shown (1 of 2)]
[im 1/16]
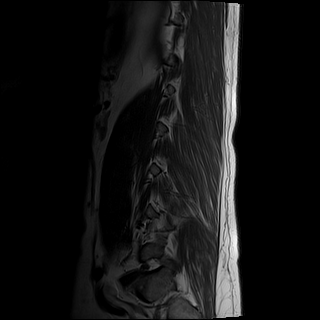
[im 4/16]
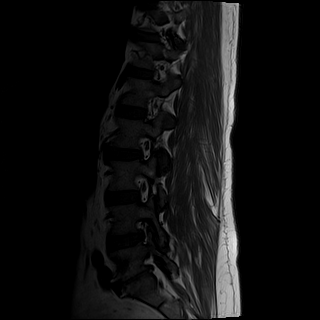
[im 8/16]
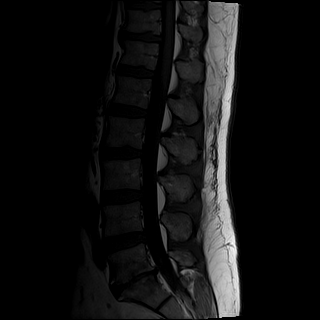
[im 12/16]
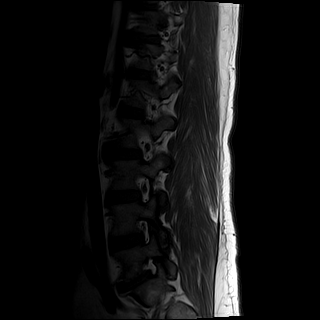
[im 16/16]
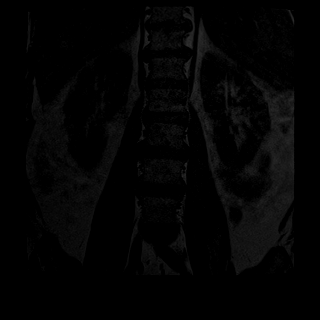

[Series 6: STIR · sagittal · 4.0mm · 1.17mm/px · 6 of 16 slices shown]
[im 1/16]
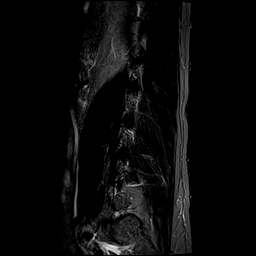
[im 4/16]
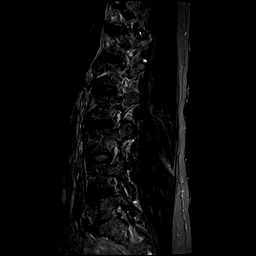
[im 7/16]
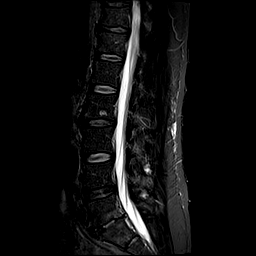
[im 10/16]
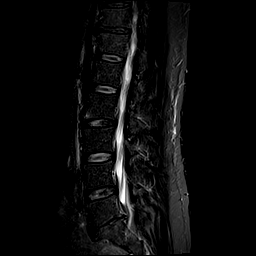
[im 13/16]
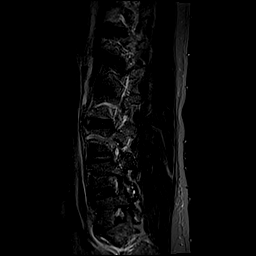
[im 16/16]
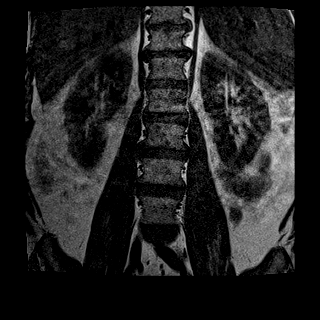

[Series 7: T2 · axial · 4.0mm · 0.78mm/px · z∈[-138,+175]mm · 11 of 44 slices shown (2 of 2)]
[im 1/44]
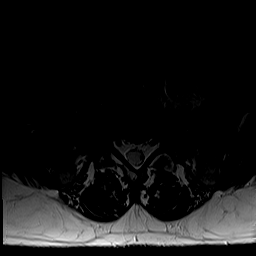
[im 3/44]
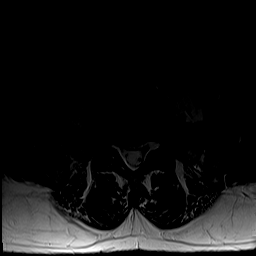
[im 6/44]
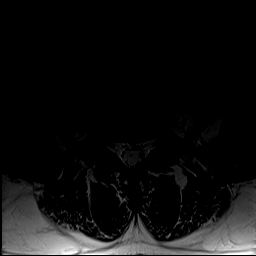
[im 9/44]
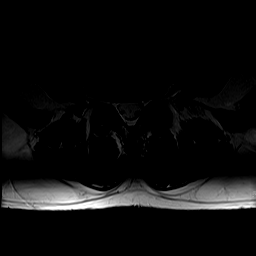
[im 15/44]
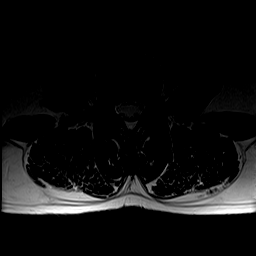
[im 21/44]
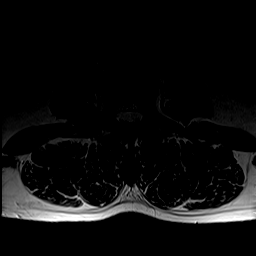
[im 23/44]
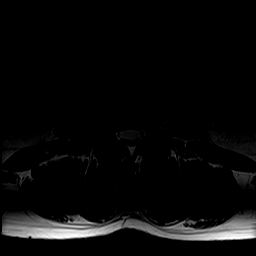
[im 26/44]
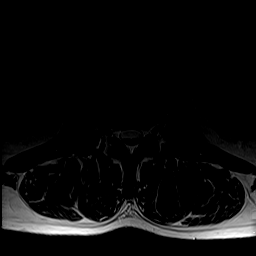
[im 32/44]
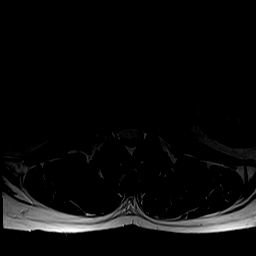
[im 38/44]
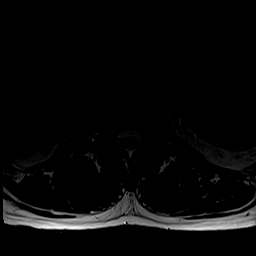
[im 44/44]
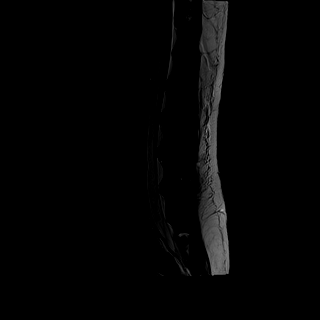

[Series 8: T1 · axial · 4.0mm · 0.39mm/px · z∈[-129,+175]mm · 10 of 44 slices shown (2 of 2)]
[im 3/44]
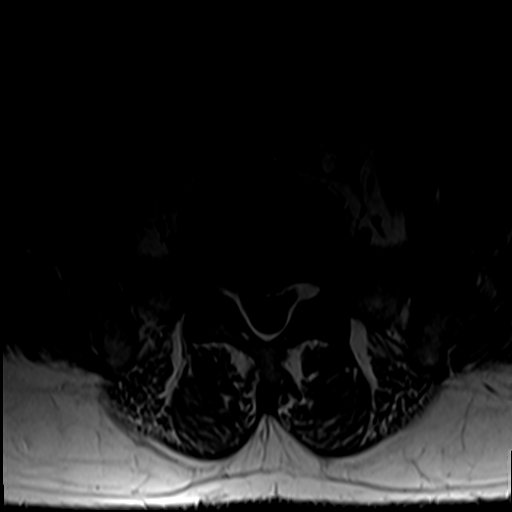
[im 6/44]
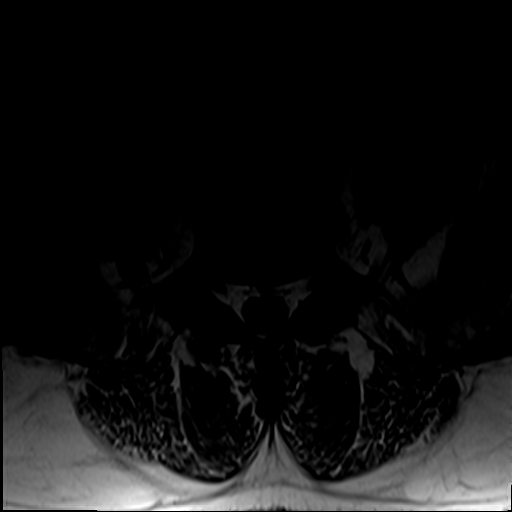
[im 9/44]
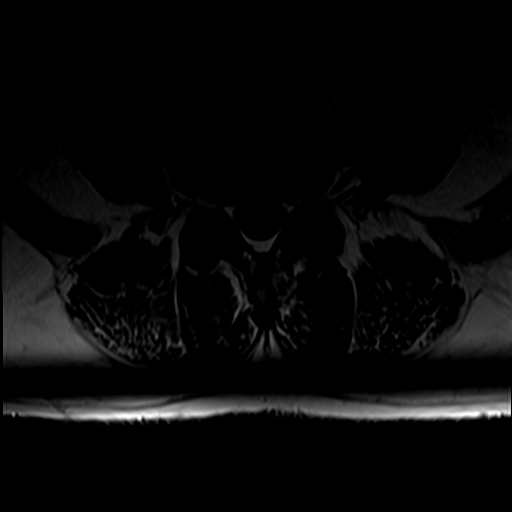
[im 15/44]
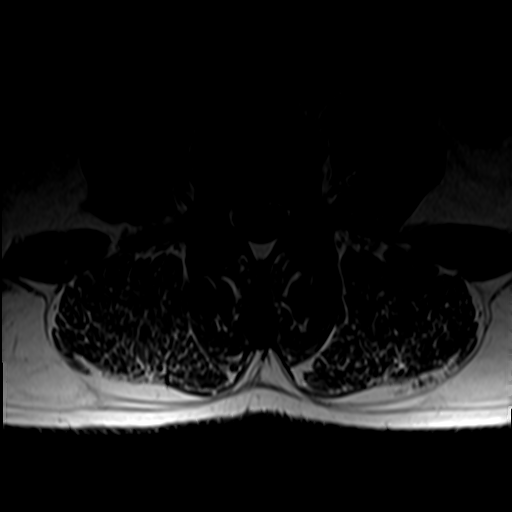
[im 21/44]
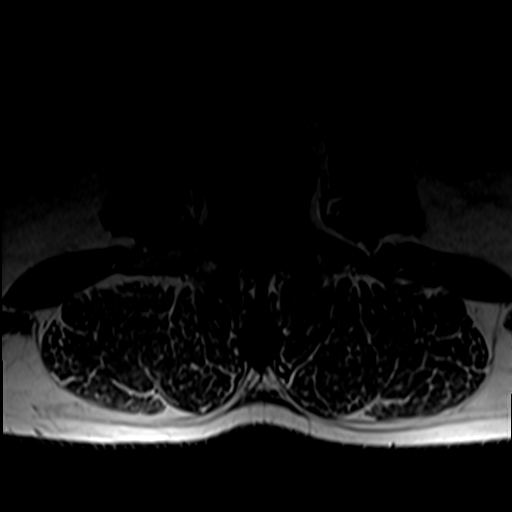
[im 23/44]
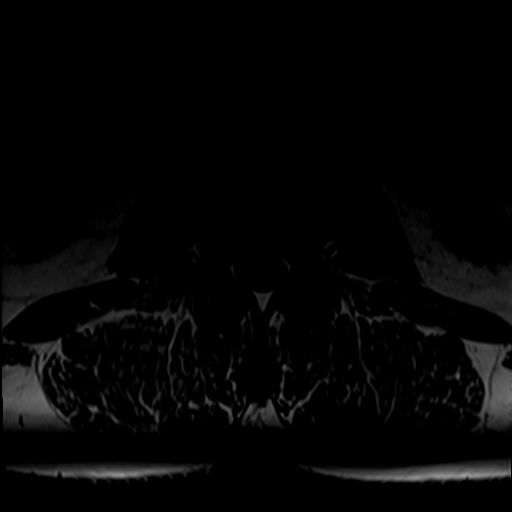
[im 26/44]
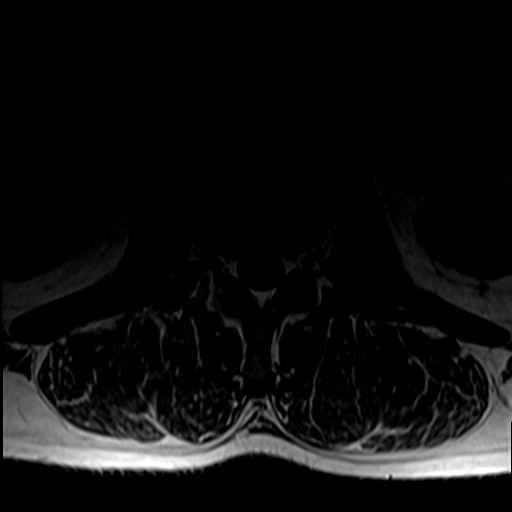
[im 32/44]
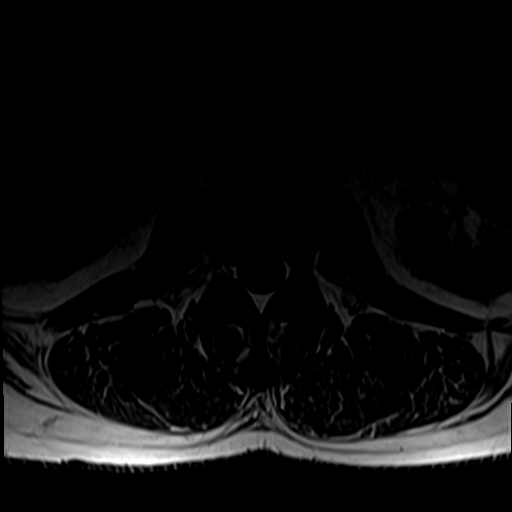
[im 38/44]
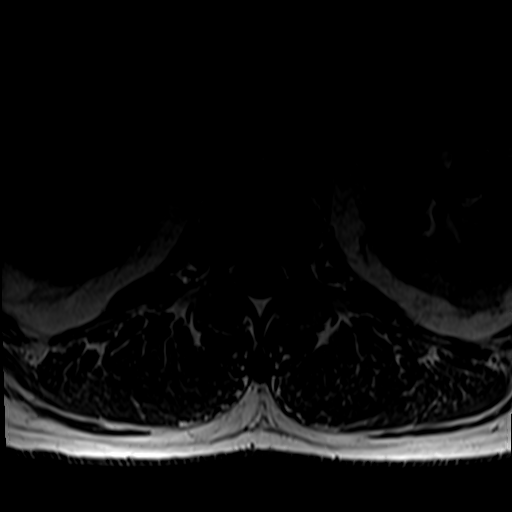
[im 44/44]
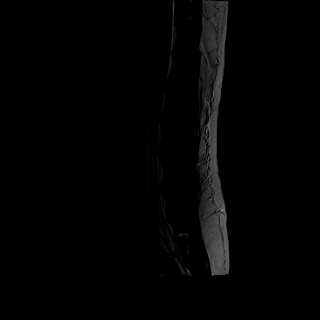

[37 of 48 positions shown; findings below may reference images not displayed]

FINDINGS: The vertebral bodies of the lumbar spine are normal in size and alignment.
There is normal bone marrow signal demonstrated throughout the vertebra.
There is degenerative disc disease with disc height loss at L5-S1. The
remainder the spaces are preserved.

The spinal cord is of normal volume and contour. The cord terminates
normally at L1 . The nerve roots of the cauda equina and the filum terminale
have the usual appearance.

The visualized portions of the SI joints are unremarkable.

The imaged intra-abdominal contents are unremarkable.

T12-L1: No significant disc bulge. No evidence of neural foraminal or
central stenosis.

L1-L2: No significant disc bulge. No evidence of neural foraminal or central
stenosis.

L2-L3: Mild broad-based disc bulge. No evidence of neural foraminal or
central stenosis.

L3-L4: Mild broad-based disc bulge. No evidence of neural foraminal or
central stenosis.

L4-L5: No significant disc bulge. Small left posterior lateral annular tear.
No evidence of neural foraminal or central stenosis. Mild right facet
arthropathy.

L5-S1: Mild broad-based disc osteophyte complex. Mild bilateral facet
arthropathy. No evidence of neural foraminal or central stenosis.
IMPRESSION: 1. Mild lumbar spine spondylosis as described above.

[REDACTED]

## 2013-07-31 LAB — PROTIME-INR
INR: 0.9
Prothrombin Time: 12.7 secs (ref 11.5–14.7)

## 2013-07-31 LAB — COMPREHENSIVE METABOLIC PANEL
Albumin: 3.5 g/dL (ref 3.4–5.0)
Alkaline Phosphatase: 79 U/L (ref 50–136)
Anion Gap: 8 (ref 7–16)
Calcium, Total: 8.7 mg/dL (ref 8.5–10.1)
Creatinine: 0.9 mg/dL (ref 0.60–1.30)
EGFR (African American): >60
Glucose: 92 mg/dL (ref 65–99)
Osmolality: 277 (ref 275–301)
Potassium: 3.8 mmol/L (ref 3.5–5.1)
SGOT(AST): 27 U/L (ref 15–37)
SGPT (ALT): 33 U/L (ref 12–78)
Total Protein: 6.8 g/dL (ref 6.4–8.2)

## 2013-07-31 LAB — CBC WITH DIFFERENTIAL/PLATELET
Eosinophil #: 0.3 10*3/uL (ref 0.0–0.7)
Eosinophil %: 1.9 %
HCT: 52.4 % — ABNORMAL HIGH (ref 40.0–52.0)
Lymphocyte #: 4.3 10*3/uL — ABNORMAL HIGH (ref 1.0–3.6)
MCHC: 33.9 g/dL (ref 32.0–36.0)
Monocyte %: 5.3 %
Neutrophil #: 9.8 10*3/uL — ABNORMAL HIGH (ref 1.4–6.5)
Neutrophil %: 63.8 %
Platelet: 196 10*3/uL (ref 150–440)
RBC: 5.83 10*6/uL (ref 4.40–5.90)
RDW: 13.8 % (ref 11.5–14.5)
WBC: 15.3 10*3/uL — ABNORMAL HIGH (ref 3.8–10.6)

## 2013-07-31 LAB — TROPONIN I: Troponin-I: <0.02 ng/mL

## 2013-07-31 LAB — TSH: Thyroid Stimulating Horm: 1.25 u[IU]/mL

## 2013-07-31 LAB — LIPASE, BLOOD: Lipase: 101 U/L (ref 73–393)

## 2013-07-31 LAB — ETHANOL: Ethanol: 154 mg/dL

## 2013-08-01 ENCOUNTER — Inpatient Hospital Stay: Payer: Self-pay | Admitting: Internal Medicine

## 2013-08-01 LAB — TROPONIN I
Troponin-I: <0.02 ng/mL
Troponin-I: <0.02 ng/mL

## 2013-08-01 LAB — URINALYSIS, COMPLETE
Bilirubin,UR: NEGATIVE
Ph: 5 (ref 4.5–8.0)
Specific Gravity: 1.011 (ref 1.003–1.030)
WBC UR: NONE SEEN /HPF (ref 0–5)

## 2013-08-01 LAB — DRUG SCREEN, URINE
Amphetamines, Ur Screen: NEGATIVE (ref ?–1000)
Barbiturates, Ur Screen: NEGATIVE (ref ?–200)
Benzodiazepine, Ur Scrn: NEGATIVE (ref ?–200)
Cannabinoid 50 Ng, Ur ~~LOC~~: NEGATIVE (ref ?–50)
Cocaine Metabolite,Ur ~~LOC~~: POSITIVE (ref ?–300)
Opiate, Ur Screen: POSITIVE (ref ?–300)
Phencyclidine (PCP) Ur S: NEGATIVE (ref ?–25)
Tricyclic, Ur Screen: NEGATIVE (ref ?–1000)

## 2013-08-01 LAB — CK TOTAL AND CKMB (NOT AT ARMC)
CK, Total: 54 U/L (ref 35–232)
CK, Total: 67 U/L (ref 35–232)
CK, Total: 69 U/L (ref 35–232)
CK-MB: 0.5 ng/mL (ref 0.5–3.6)
CK-MB: <0.5 ng/mL — ABNORMAL LOW (ref 0.5–3.6)

## 2013-08-05 ENCOUNTER — Ambulatory Visit: Payer: Self-pay | Admitting: Gastroenterology

## 2013-08-27 IMAGING — CR DG CHEST 1V PORT
1 series · 1 of 1 positions shown · non-contrast
Comparison: none

REASON FOR EXAM: chest pain
COMMENTS:

PROCEDURE:     DXR - DXR PORTABLE CHEST SINGLE VIEW  - October 30, 2012 [DATE]
RESULT:     Comparison: None

[ap]
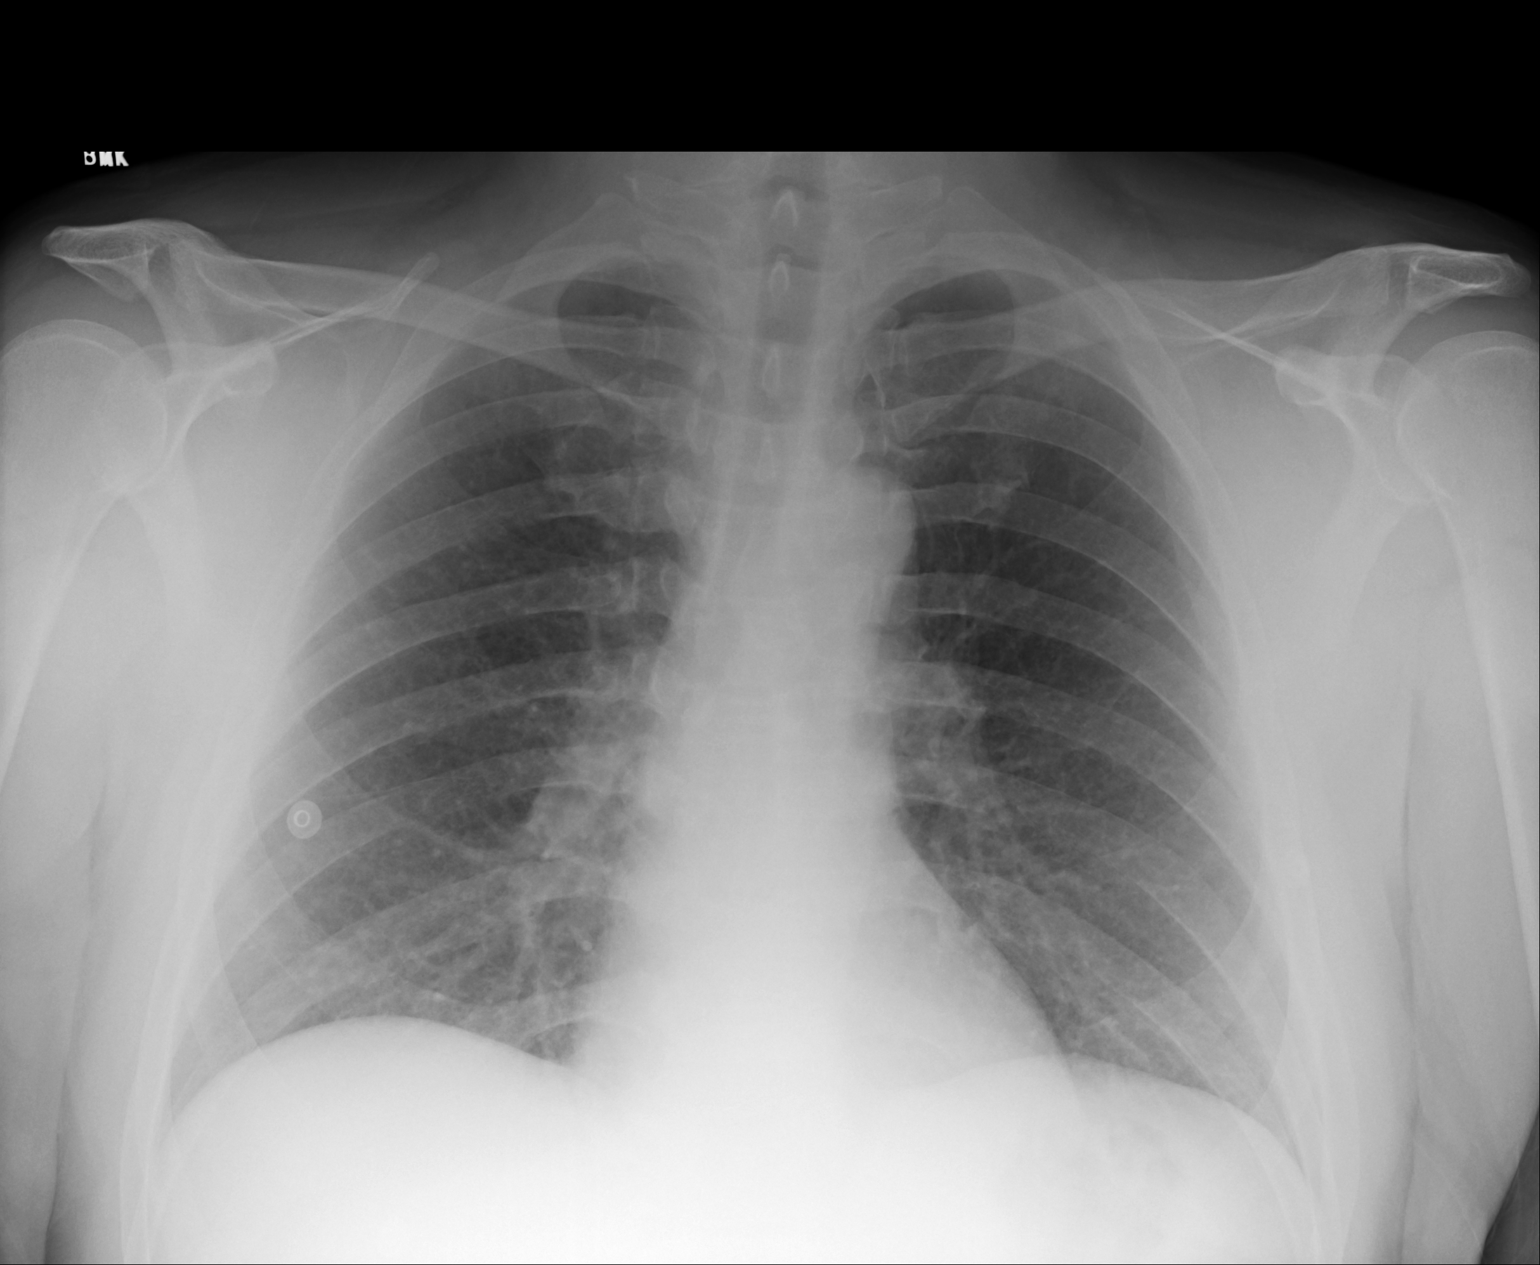

[1 of 1 positions shown; findings below may reference images not displayed]

FINDINGS: Single portable AP chest radiograph is provided.  There is no focal
parenchymal opacity, pleural effusion, or pneumothorax. Normal
cardiomediastinal silhouette. The osseous structures are unremarkable.
IMPRESSION: No acute disease of the che[REDACTED]

## 2013-09-04 ENCOUNTER — Emergency Department: Payer: Self-pay | Admitting: Emergency Medicine

## 2013-09-04 LAB — BASIC METABOLIC PANEL
BUN: 7 mg/dL (ref 7–18)
Calcium, Total: 8.7 mg/dL (ref 8.5–10.1)
Chloride: 107 mmol/L (ref 98–107)
EGFR (Non-African Amer.): >60
Osmolality: 279 (ref 275–301)
Potassium: 3.8 mmol/L (ref 3.5–5.1)
Sodium: 141 mmol/L (ref 136–145)

## 2013-09-05 LAB — CBC
HGB: 17 g/dL (ref 13.0–18.0)
MCH: 30.5 pg (ref 26.0–34.0)
MCV: 90 fL (ref 80–100)
Platelet: 220 10*3/uL (ref 150–440)
RBC: 5.58 10*6/uL (ref 4.40–5.90)
RDW: 13.7 % (ref 11.5–14.5)
WBC: 11 10*3/uL — ABNORMAL HIGH (ref 3.8–10.6)

## 2013-09-05 LAB — ETHANOL
Ethanol %: 0.119 % — ABNORMAL HIGH (ref 0.000–0.080)
Ethanol: 119 mg/dL

## 2013-09-05 LAB — TROPONIN I: Troponin-I: <0.02 ng/mL

## 2013-10-12 ENCOUNTER — Emergency Department: Payer: Self-pay | Admitting: Emergency Medicine

## 2013-10-12 LAB — CBC WITH DIFFERENTIAL/PLATELET
Basophil %: 1.3 %
Eosinophil %: 0.9 %
HGB: 19.8 g/dL — ABNORMAL HIGH (ref 13.0–18.0)
Lymphocyte #: 5.1 10*3/uL — ABNORMAL HIGH (ref 1.0–3.6)
MCH: 29.9 pg (ref 26.0–34.0)
MCHC: 33.7 g/dL (ref 32.0–36.0)
MCV: 89 fL (ref 80–100)
Monocyte #: 0.9 x10 3/mm (ref 0.2–1.0)
Neutrophil #: 7.7 10*3/uL — ABNORMAL HIGH (ref 1.4–6.5)
Neutrophil %: 55.1 %
Platelet: 240 10*3/uL (ref 150–440)
RDW: 13.1 % (ref 11.5–14.5)
WBC: 14 10*3/uL — ABNORMAL HIGH (ref 3.8–10.6)

## 2013-10-12 LAB — COMPREHENSIVE METABOLIC PANEL
BUN: 9 mg/dL (ref 7–18)
Calcium, Total: 9 mg/dL (ref 8.5–10.1)
Chloride: 103 mmol/L (ref 98–107)
Creatinine: 1.12 mg/dL (ref 0.60–1.30)
EGFR (Non-African Amer.): >60
Glucose: 85 mg/dL (ref 65–99)
Osmolality: 268 (ref 275–301)
Potassium: 4.1 mmol/L (ref 3.5–5.1)
SGOT(AST): 34 U/L (ref 15–37)
Sodium: 135 mmol/L — ABNORMAL LOW (ref 136–145)
Total Protein: 8 g/dL (ref 6.4–8.2)

## 2013-10-13 DIAGNOSIS — C61 Malignant neoplasm of prostate: Secondary | ICD-10-CM | POA: Insufficient documentation

## 2013-10-13 LAB — DRUG SCREEN, URINE
Cannabinoid 50 Ng, Ur ~~LOC~~: NEGATIVE (ref ?–50)
Cocaine Metabolite,Ur ~~LOC~~: POSITIVE (ref ?–300)
MDMA (Ecstasy)Ur Screen: NEGATIVE (ref ?–500)
Methadone, Ur Screen: NEGATIVE (ref ?–300)
Opiate, Ur Screen: NEGATIVE (ref ?–300)
Phencyclidine (PCP) Ur S: NEGATIVE (ref ?–25)
Tricyclic, Ur Screen: NEGATIVE (ref ?–1000)

## 2013-10-13 LAB — TROPONIN I: Troponin-I: <0.02 ng/mL

## 2013-10-13 LAB — ETHANOL: Ethanol: 108 mg/dL

## 2013-10-16 ENCOUNTER — Emergency Department: Payer: Self-pay | Admitting: Emergency Medicine

## 2013-10-16 LAB — DRUG SCREEN, URINE
Barbiturates, Ur Screen: NEGATIVE (ref ?–200)
Benzodiazepine, Ur Scrn: NEGATIVE (ref ?–200)
Cocaine Metabolite,Ur ~~LOC~~: POSITIVE (ref ?–300)
MDMA (Ecstasy)Ur Screen: NEGATIVE (ref ?–500)
Methadone, Ur Screen: NEGATIVE (ref ?–300)
Phencyclidine (PCP) Ur S: NEGATIVE (ref ?–25)

## 2013-10-16 LAB — COMPREHENSIVE METABOLIC PANEL
Albumin: 3.8 g/dL (ref 3.4–5.0)
Alkaline Phosphatase: 101 U/L (ref 50–136)
Anion Gap: 9 (ref 7–16)
BUN: 7 mg/dL (ref 7–18)
Calcium, Total: 8.8 mg/dL (ref 8.5–10.1)
Chloride: 106 mmol/L (ref 98–107)
Co2: 24 mmol/L (ref 21–32)
EGFR (African American): >60
EGFR (Non-African Amer.): >60
Glucose: 100 mg/dL — ABNORMAL HIGH (ref 65–99)
Potassium: 3.9 mmol/L (ref 3.5–5.1)
SGOT(AST): 31 U/L (ref 15–37)
SGPT (ALT): 30 U/L (ref 12–78)
Sodium: 139 mmol/L (ref 136–145)
Total Protein: 7.3 g/dL (ref 6.4–8.2)

## 2013-10-16 LAB — URINALYSIS, COMPLETE
Bilirubin,UR: NEGATIVE
Blood: NEGATIVE
Glucose,UR: NEGATIVE mg/dL (ref 0–75)
Nitrite: NEGATIVE
Specific Gravity: 1.005 (ref 1.003–1.030)
Squamous Epithelial: NONE SEEN

## 2013-10-16 LAB — CBC WITH DIFFERENTIAL/PLATELET
Basophil %: 1.2 %
HGB: 18.7 g/dL — ABNORMAL HIGH (ref 13.0–18.0)
Lymphocyte #: 4.6 10*3/uL — ABNORMAL HIGH (ref 1.0–3.6)
Lymphocyte %: 33.5 %
MCV: 89 fL (ref 80–100)
Neutrophil #: 8 10*3/uL — ABNORMAL HIGH (ref 1.4–6.5)
Platelet: 223 10*3/uL (ref 150–440)
WBC: 13.7 10*3/uL — ABNORMAL HIGH (ref 3.8–10.6)

## 2013-10-16 LAB — CK TOTAL AND CKMB (NOT AT ARMC)
CK, Total: 98 U/L (ref 35–232)
CK-MB: 1.7 ng/mL (ref 0.5–3.6)

## 2013-10-16 LAB — TROPONIN I: Troponin-I: <0.02 ng/mL

## 2013-10-21 IMAGING — CT CT HEAD WITHOUT CONTRAST
2 series · 16 of 30 positions shown, 20 images · non-contrast
Comparison: none

REASON FOR EXAM: STATES HE FELL, ? STRUCK HEAD
COMMENTS:   May transport without cardiac monitor

PROCEDURE:     CT  - CT HEAD WITHOUT CONTRAST  - December 24, 2012 [DATE]
RESULT:     History: Fall.
Comparison Study: Prior CT of 09/14/2012.

[Series 2: without · axial · non-contrast · 0.45mm/px · z∈[+28,+163]mm · 13 of 33 slices shown, 17 images]
[im 3/33  brain]
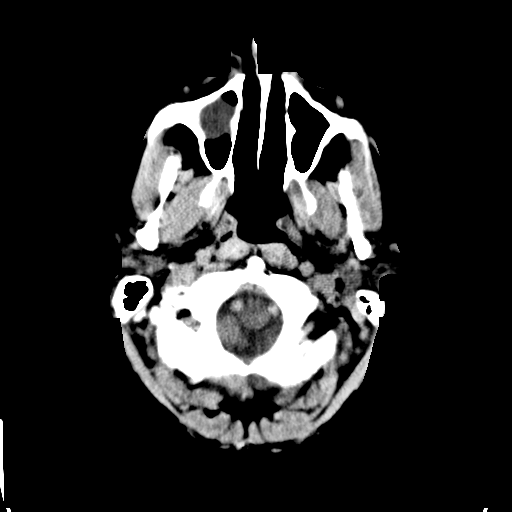
[im 3/33  bone]
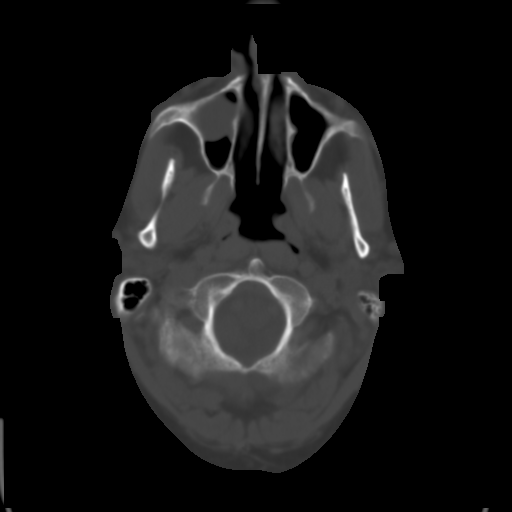
[im 5/33  brain]
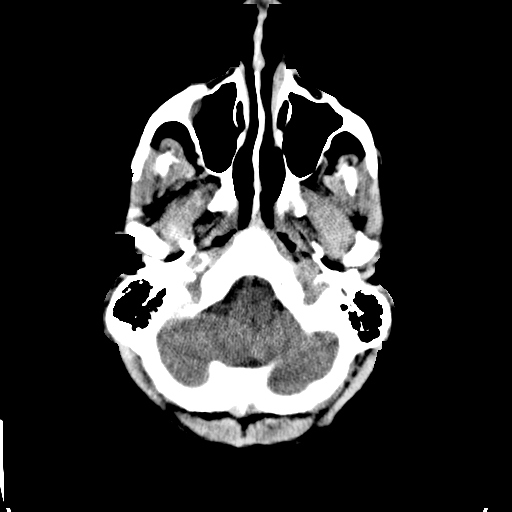
[im 7/33  brain]
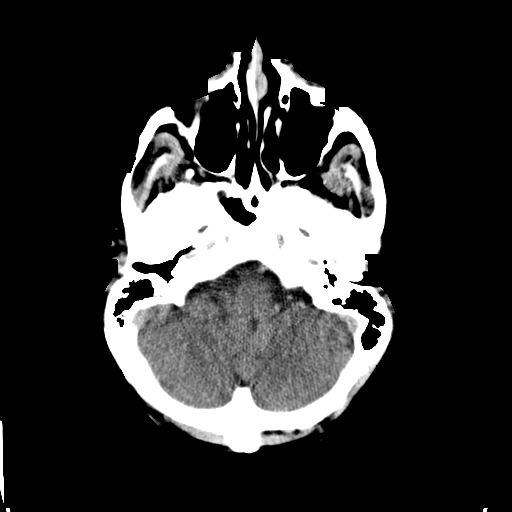
[im 10/33  brain]
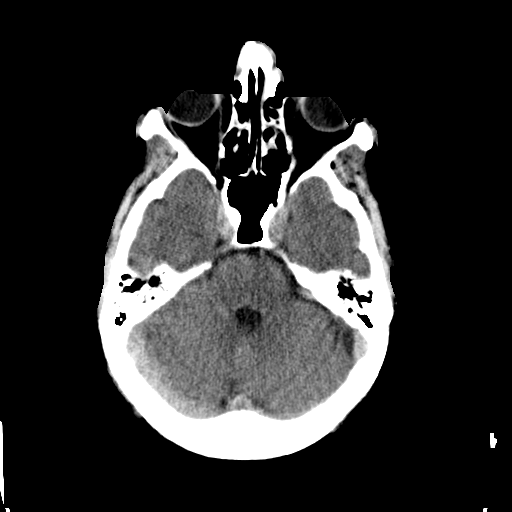
[im 12/33  brain]
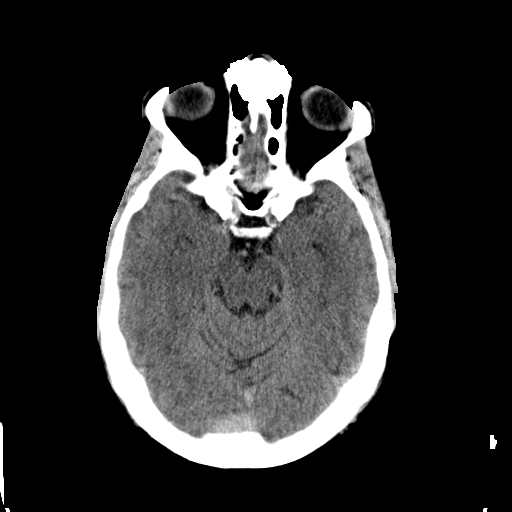
[im 12/33  bone]
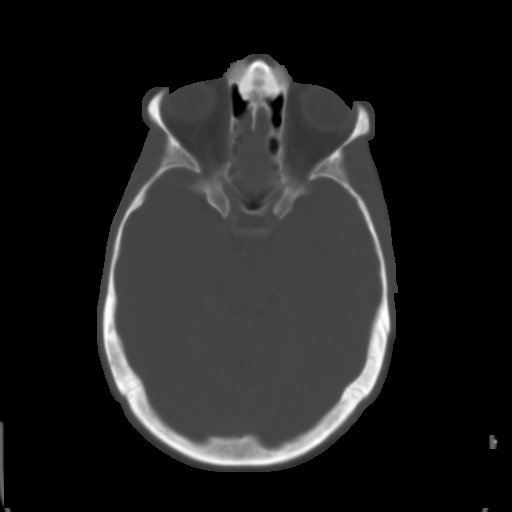
[im 14/33  brain]
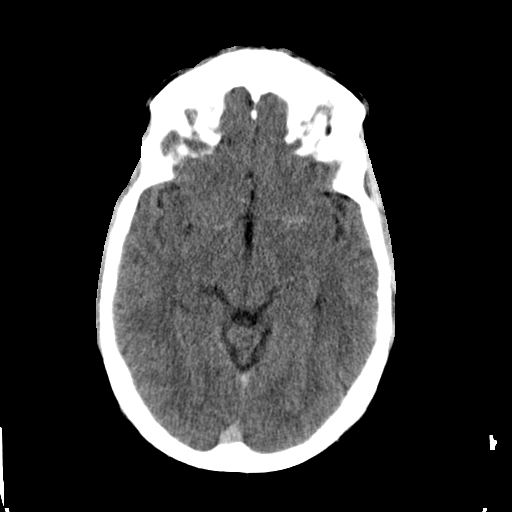
[im 17/33  brain]
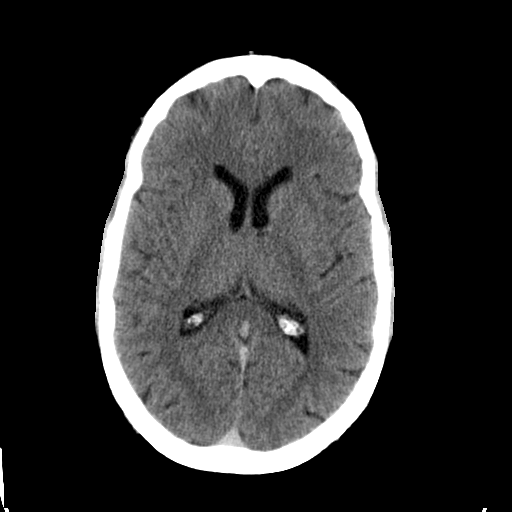
[im 19/33  brain]
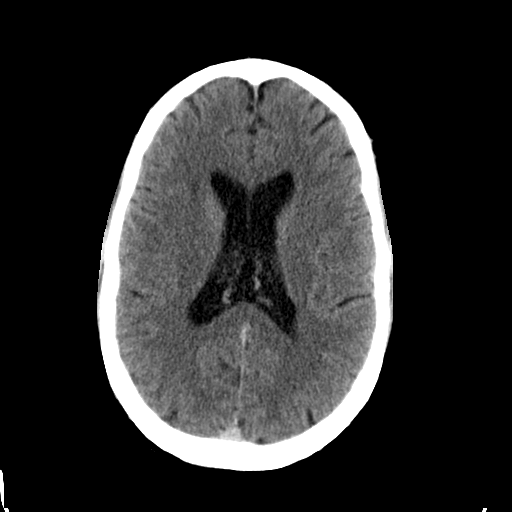
[im 21/33  brain]
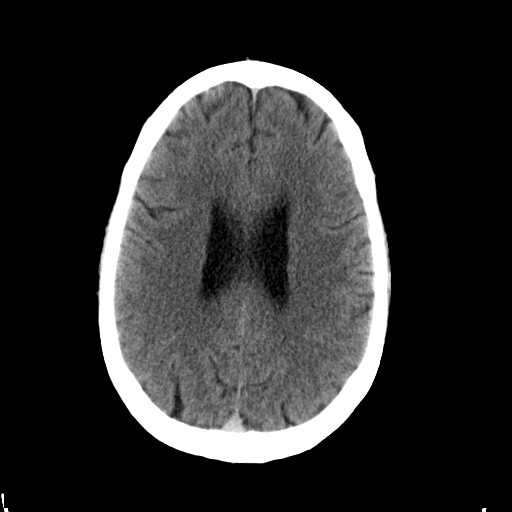
[im 21/33  bone]
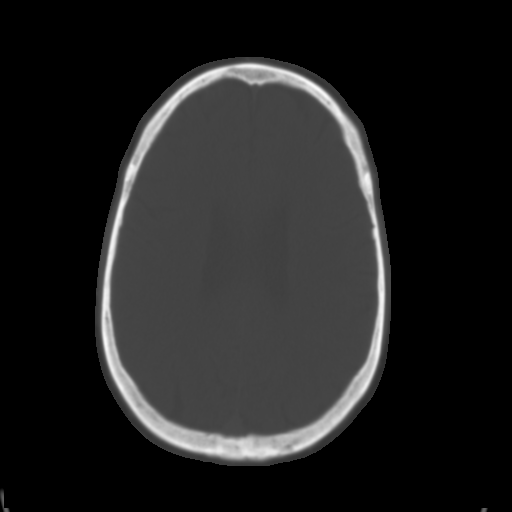
[im 23/33  brain]
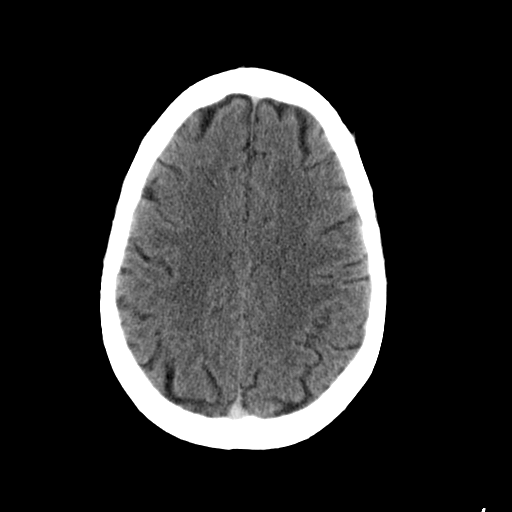
[im 26/33  brain]
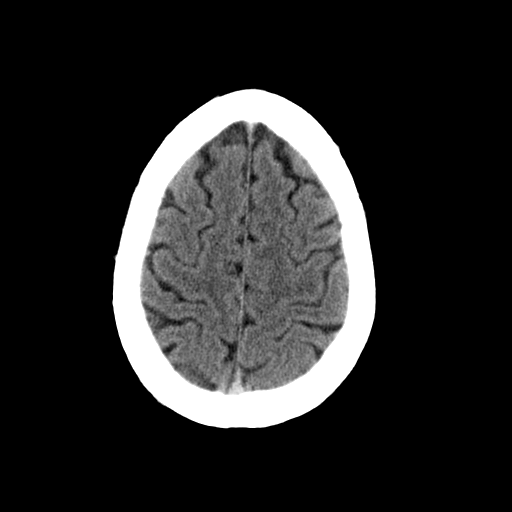
[im 28/33  brain]
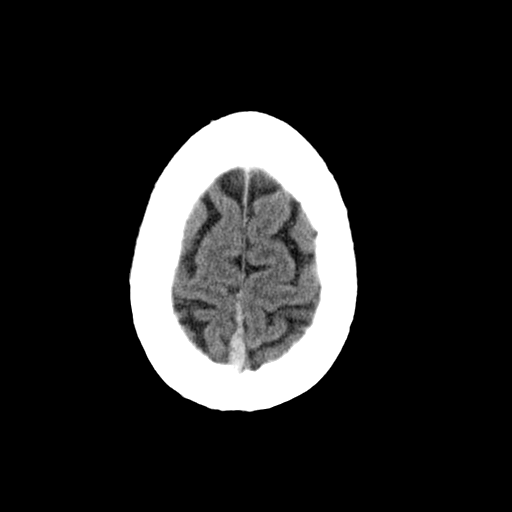
[im 30/33  brain]
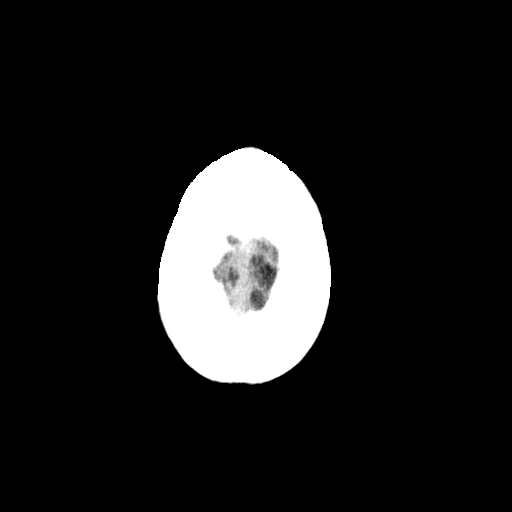
[im 30/33  bone]
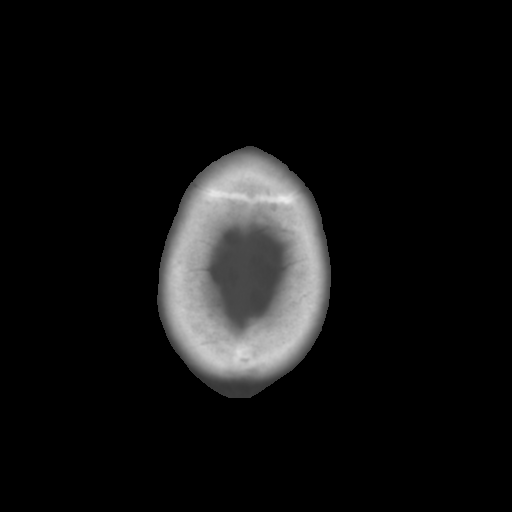

[Series 3: bone · axial · 0.45mm/px · z∈[+28,+73]mm · 3 of 33 slices shown]
[im 3/33  bone]
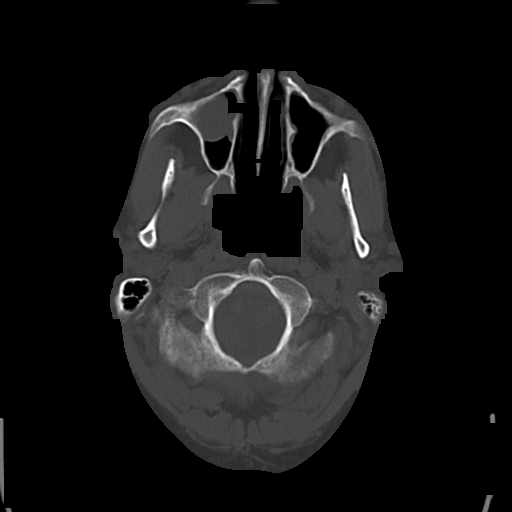
[im 7/33  bone]
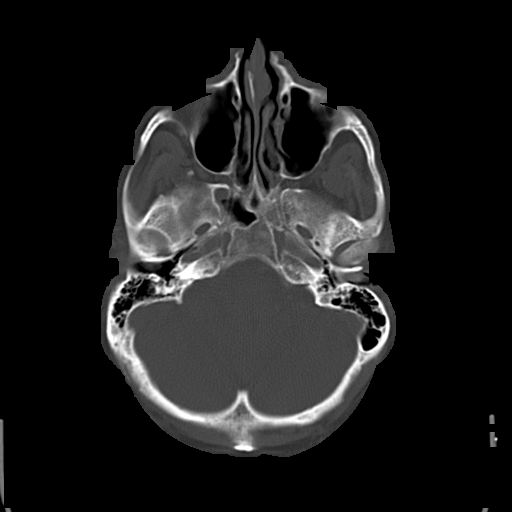
[im 12/33  bone]
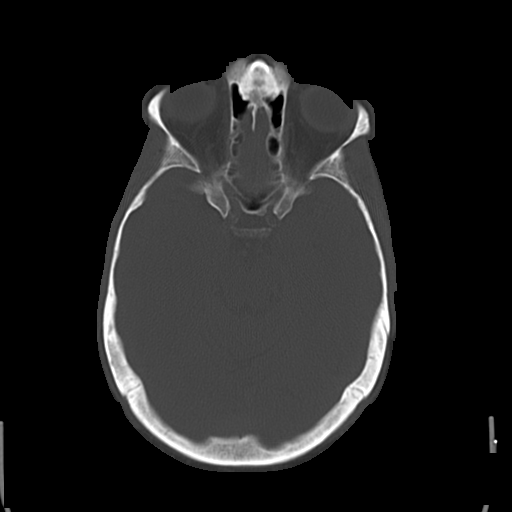

[16 of 30 positions shown; findings below may reference images not displayed]

FINDINGS: Standard nonenhanced CT obtained. No mass. No hydrocephalus. No
hemorrhage. No acute bony abnormality identified. Mucosal thickening noted
ethmoidal in the maxillary sinuses.
IMPRESSION: No acute abnormality.

## 2013-10-21 IMAGING — CT CT CHEST-ABD-PELV W/ CM
1 of 3 series · 14 of 32 positions shown, 20 images · non-contrast
Comparison: none

REASON FOR EXAM: (1) IV ONLY; STATES HE FELL, RIGHT LATERAL CHEST; (2)
RUQ PAIN
COMMENTS:   May transport without cardiac monitor

PROCEDURE:     CT  - CT CHEST ABDOMEN AND PELVIS W  - December 24, 2012 [DATE]
RESULT:     History: Fall.
Comparison Study: Prior CT of 04/19/2012. Prior CT of[DATE].

[Series 2: soft tissue · axial · 0.88mm/px · z∈[-734,-124]mm · 14 of 237 slices shown, 20 images]
[im 17/237  mediastinal]
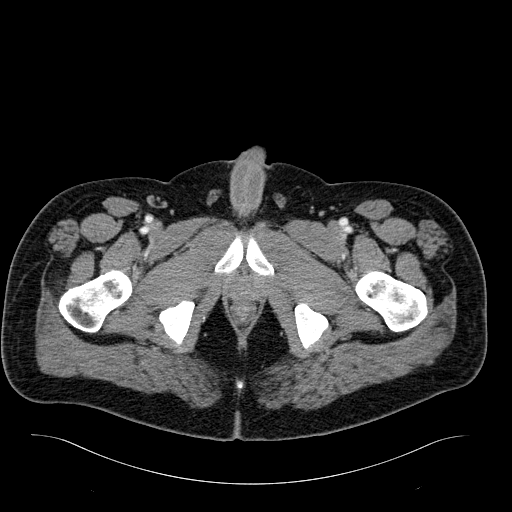
[im 17/237  bone]
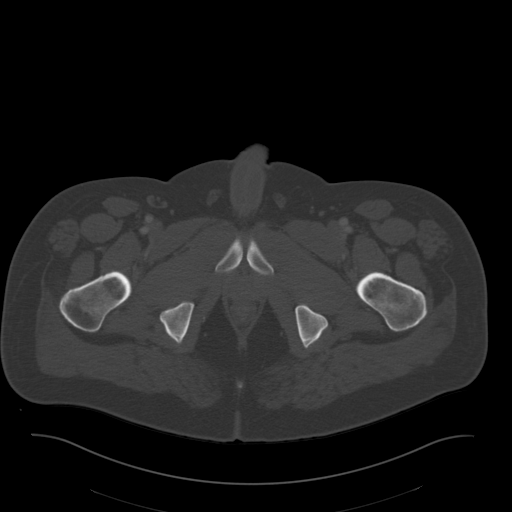
[im 34/237  mediastinal]
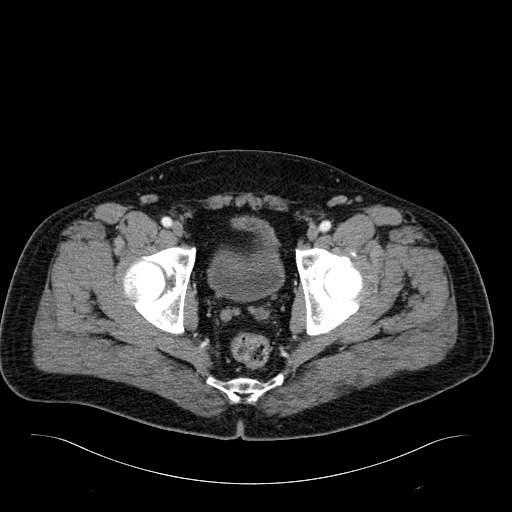
[im 51/237  mediastinal]
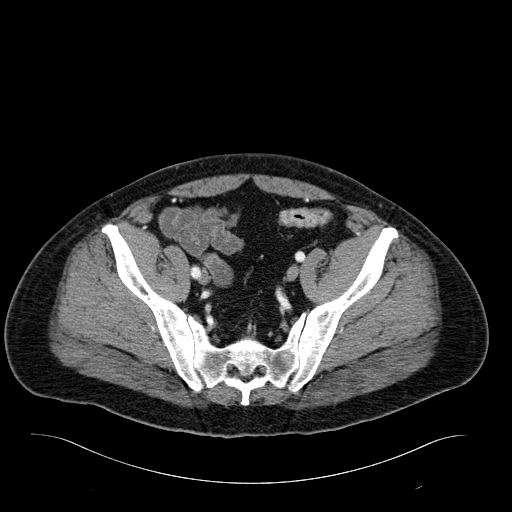
[im 79/237  mediastinal]
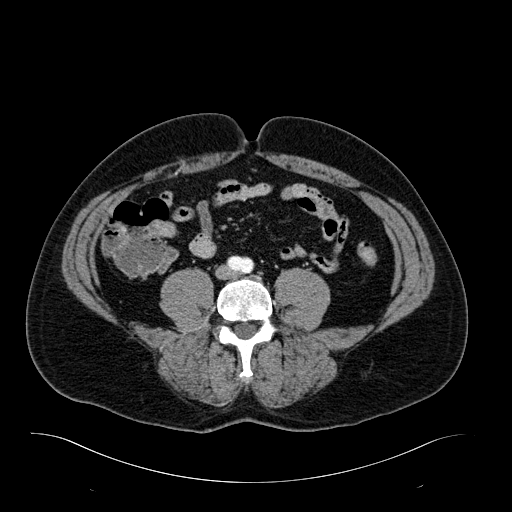
[im 85/237  mediastinal]
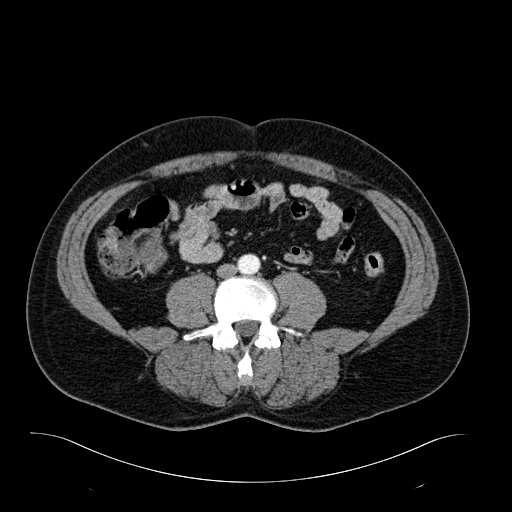
[im 102/237  mediastinal]
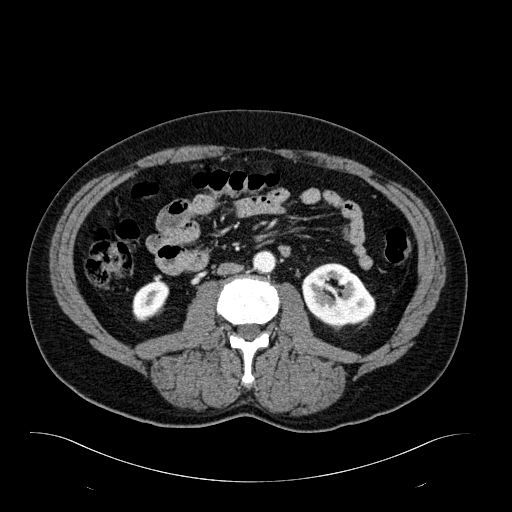
[im 116/237  mediastinal]
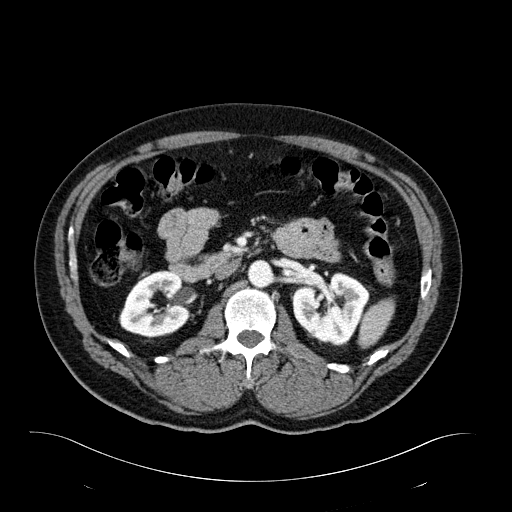
[im 119/237  mediastinal]
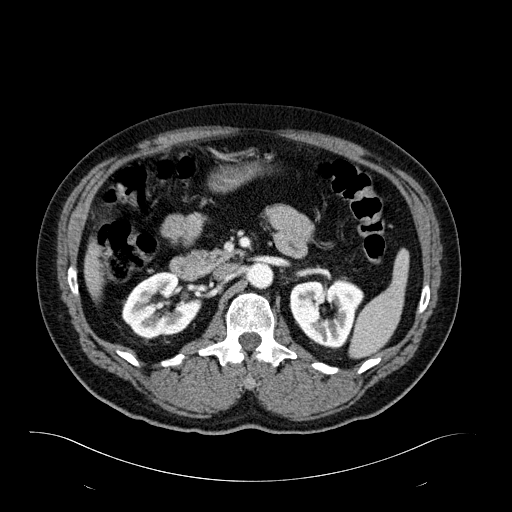
[im 135/237  mediastinal]
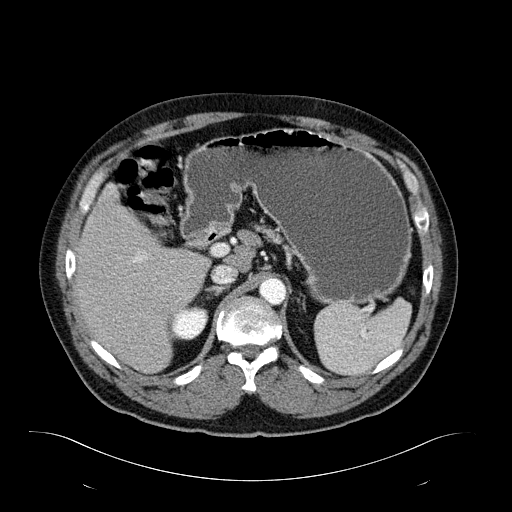
[im 135/237  bone]
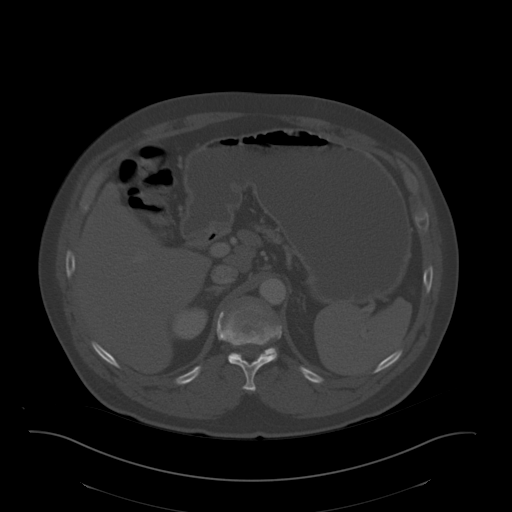
[im 152/237  mediastinal]
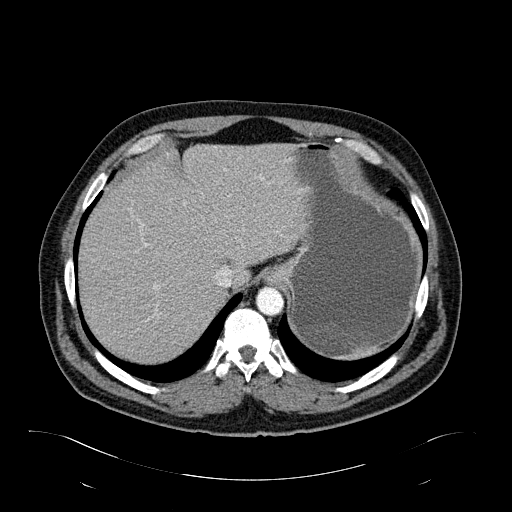
[im 169/237  mediastinal]
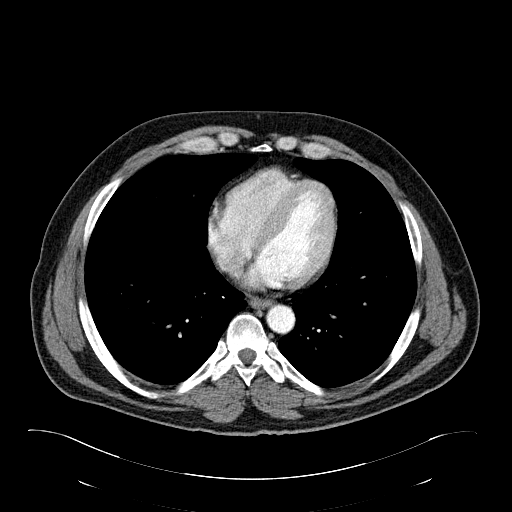
[im 169/237  lung]
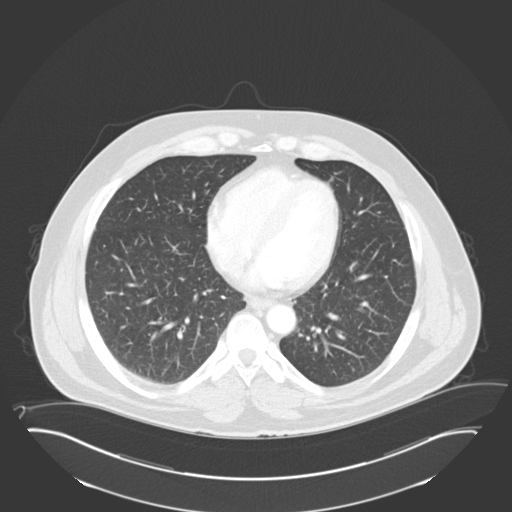
[im 186/237  mediastinal]
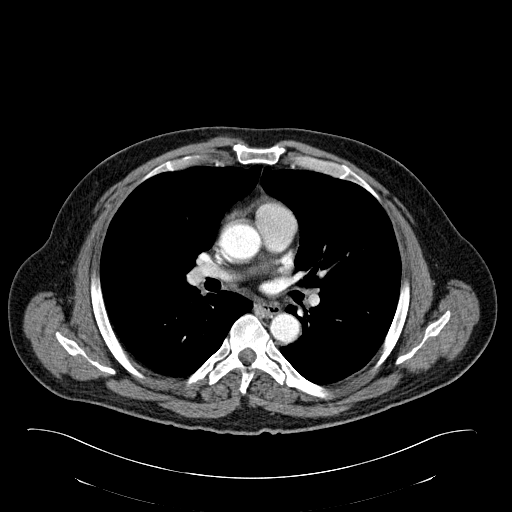
[im 186/237  lung]
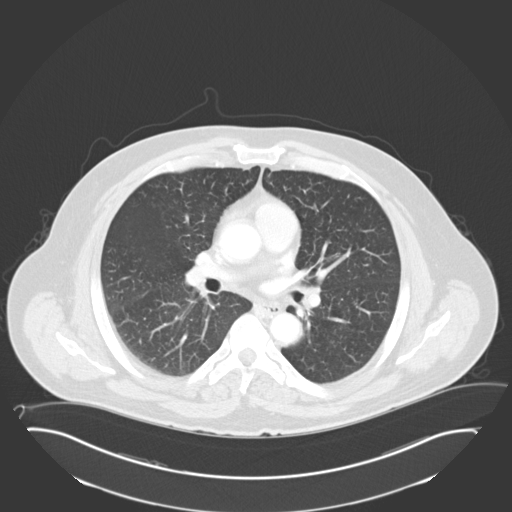
[im 203/237  mediastinal]
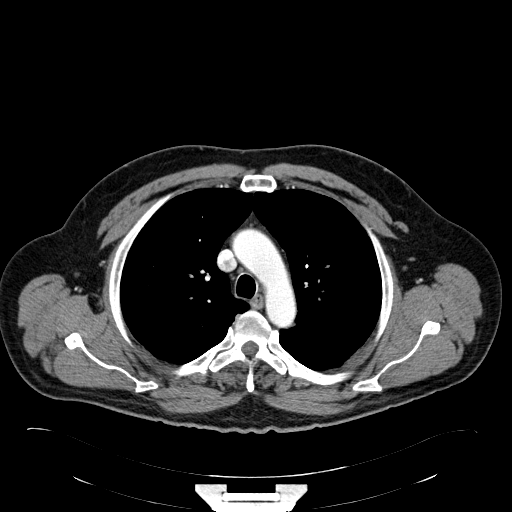
[im 203/237  lung]
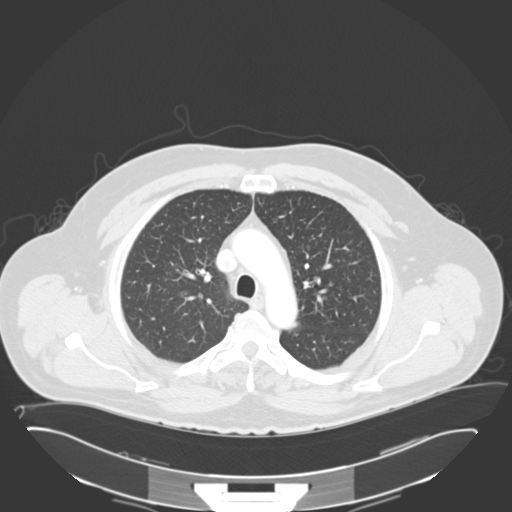
[im 220/237  mediastinal]
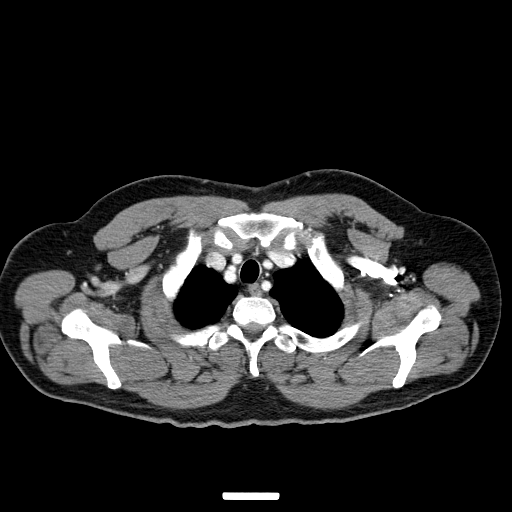
[im 220/237  lung]
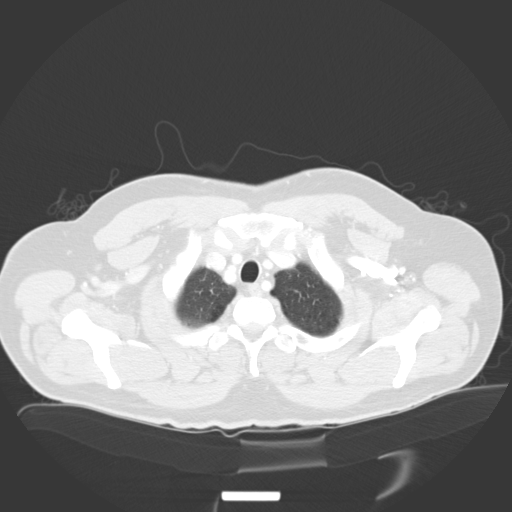

[14 of 32 positions shown; findings below may reference images not displayed]

FINDINGS: Standard CT obtained under cc of [facility]. Small thyroid cyst is
present. Thoracic aorta is normal. Adrenals are normal. Four arteries are
normal. Large airways are patent. Lungs are clear. Liver normal. Surgical
clips right upper quadrant. Spleen normal. Pancreas normal. Stomach is
slightly distended. No bowel distention. Appendix not visualized. Lung bases
clear. No free air. No pneumothorax. No bony abnormalities identified.
IMPRESSION: No acute abnormalities noted.

## 2013-11-15 ENCOUNTER — Inpatient Hospital Stay (HOSPITAL_COMMUNITY)
Admission: EM | Admit: 2013-11-15 | Discharge: 2013-11-17 | DRG: 558 | Disposition: A | Discharge status: Home / Self Care | Payer: Medicaid Other | Attending: Family Medicine | Admitting: Family Medicine

## 2013-11-15 ENCOUNTER — Encounter (HOSPITAL_COMMUNITY): Payer: Self-pay | Admitting: Radiology

## 2013-11-15 ENCOUNTER — Emergency Department (HOSPITAL_COMMUNITY): Payer: Medicaid Other

## 2013-11-15 DIAGNOSIS — F1410 Cocaine abuse, uncomplicated: Secondary | ICD-10-CM | POA: Diagnosis present

## 2013-11-15 DIAGNOSIS — R531 Weakness: Secondary | ICD-10-CM

## 2013-11-15 DIAGNOSIS — F809 Developmental disorder of speech and language, unspecified: Secondary | ICD-10-CM | POA: Diagnosis present

## 2013-11-15 DIAGNOSIS — F411 Generalized anxiety disorder: Secondary | ICD-10-CM | POA: Diagnosis present

## 2013-11-15 DIAGNOSIS — R4789 Other speech disturbances: Secondary | ICD-10-CM

## 2013-11-15 DIAGNOSIS — F458 Other somatoform disorders: Principal | ICD-10-CM | POA: Diagnosis present

## 2013-11-15 DIAGNOSIS — G8190 Hemiplegia, unspecified affecting unspecified side: Secondary | ICD-10-CM | POA: Diagnosis present

## 2013-11-15 DIAGNOSIS — I472 Ventricular tachycardia, unspecified: Secondary | ICD-10-CM | POA: Diagnosis present

## 2013-11-15 DIAGNOSIS — R079 Chest pain, unspecified: Secondary | ICD-10-CM | POA: Diagnosis present

## 2013-11-15 DIAGNOSIS — F801 Expressive language disorder: Secondary | ICD-10-CM

## 2013-11-15 DIAGNOSIS — F329 Major depressive disorder, single episode, unspecified: Secondary | ICD-10-CM | POA: Diagnosis present

## 2013-11-15 DIAGNOSIS — Z8546 Personal history of malignant neoplasm of prostate: Secondary | ICD-10-CM

## 2013-11-15 DIAGNOSIS — Z8711 Personal history of peptic ulcer disease: Secondary | ICD-10-CM

## 2013-11-15 DIAGNOSIS — F3289 Other specified depressive episodes: Secondary | ICD-10-CM | POA: Diagnosis present

## 2013-11-15 DIAGNOSIS — I10 Essential (primary) hypertension: Secondary | ICD-10-CM | POA: Diagnosis present

## 2013-11-15 DIAGNOSIS — I739 Peripheral vascular disease, unspecified: Secondary | ICD-10-CM | POA: Diagnosis present

## 2013-11-15 DIAGNOSIS — I4729 Other ventricular tachycardia: Secondary | ICD-10-CM | POA: Diagnosis present

## 2013-11-15 DIAGNOSIS — F1210 Cannabis abuse, uncomplicated: Secondary | ICD-10-CM | POA: Diagnosis present

## 2013-11-15 DIAGNOSIS — C159 Malignant neoplasm of esophagus, unspecified: Secondary | ICD-10-CM | POA: Diagnosis present

## 2013-11-15 DIAGNOSIS — D45 Polycythemia vera: Secondary | ICD-10-CM | POA: Diagnosis present

## 2013-11-15 HISTORY — DX: Anxiety disorder, unspecified: F41.9

## 2013-11-15 HISTORY — DX: Depression, unspecified: F32.A

## 2013-11-15 HISTORY — DX: Essential (primary) hypertension: I10

## 2013-11-15 HISTORY — DX: Major depressive disorder, single episode, unspecified: F32.9

## 2013-11-15 HISTORY — DX: Malignant (primary) neoplasm, unspecified: C80.1

## 2013-11-15 LAB — CBC
HCT: 53.5 % — ABNORMAL HIGH (ref 39.0–52.0)
MCH: 31.3 pg (ref 26.0–34.0)
MCHC: 35.9 g/dL (ref 30.0–36.0)
MCV: 87.3 fL (ref 78.0–100.0)
RDW: 12.6 % (ref 11.5–15.5)
WBC: 13.5 10*3/uL — ABNORMAL HIGH (ref 4.0–10.5)

## 2013-11-15 LAB — COMPREHENSIVE METABOLIC PANEL
CO2: 22 mEq/L (ref 19–32)
Calcium: 9.1 mg/dL (ref 8.4–10.5)
Creatinine, Ser: 0.79 mg/dL (ref 0.50–1.35)
GFR calc Af Amer: >90 mL/min (ref >90)
GFR calc non Af Amer: >90 mL/min (ref >90)
Glucose, Bld: 92 mg/dL (ref 70–99)
Potassium: 4.1 mEq/L (ref 3.5–5.1)
Total Protein: 7.3 g/dL (ref 6.0–8.3)

## 2013-11-15 LAB — POCT I-STAT TROPONIN I: Troponin i, poc: 0.02 ng/mL (ref 0.00–0.08)

## 2013-11-15 LAB — DIFFERENTIAL
Basophils Absolute: 0 10*3/uL (ref 0.0–0.1)
Eosinophils Absolute: 0.2 10*3/uL (ref 0.0–0.7)
Eosinophils Relative: 2 % (ref 0–5)
Lymphocytes Relative: 37 % (ref 12–46)
Lymphs Abs: 5 10*3/uL — ABNORMAL HIGH (ref 0.7–4.0)
Monocytes Absolute: 0.8 10*3/uL (ref 0.1–1.0)
Neutrophils Relative %: 56 % (ref 43–77)

## 2013-11-15 LAB — POCT I-STAT, CHEM 8
BUN: 10 mg/dL (ref 6–23)
Calcium, Ion: 1.09 mmol/L — ABNORMAL LOW (ref 1.12–1.23)
HCT: 59 % — ABNORMAL HIGH (ref 39.0–52.0)
Hemoglobin: 20.1 g/dL — ABNORMAL HIGH (ref 13.0–17.0)
Sodium: 140 mEq/L (ref 135–145)
TCO2: 22 mmol/L (ref 0–100)

## 2013-11-15 LAB — GLUCOSE, CAPILLARY: Glucose-Capillary: 90 mg/dL (ref 70–99)

## 2013-11-15 LAB — ETHANOL: Alcohol, Ethyl (B): 183 mg/dL — ABNORMAL HIGH (ref 0–11)

## 2013-11-15 LAB — APTT: aPTT: 34 seconds (ref 24–37)

## 2013-11-15 LAB — PROTIME-INR: Prothrombin Time: 12.8 seconds (ref 11.6–15.2)

## 2013-11-15 NOTE — ED Notes (Signed)
Dr. Delo at bedside. 

## 2013-11-15 NOTE — Consult Note (Addendum)
Referring Physician: Palumbo-Rasch    Chief Complaint: Chest pain, left sided weakness and numbness  HPI: Ian [name] is an 52 y.o. male who is a poor historian.  It seems that the patient has been drinking today and has had some cocaine as well.  Was eating and became choked, vomited and then experienced chest pain.  EMS was called at that time but it seems that hours prior to that he experienced left sided weakness and numbness as well.  He is unable to give any degree of exactness to the onset of his left sided weakness  Date last known well: Date: 11/15/2013 Time last known well: Unable to determine tPA Given: No: Unable to determine LKW  Past medical history:Esophageal cancer, HTN  No past surgical history on file.  Family history: Unable to obtain-patient only nods yes and no to questioning  Social History:  Reports a history of tobacco, alcohol and cocaine abuse.  Has had all today.    Allergies: Not on File  Medications: I have reviewed the patient's current medications. Prior to Admission:  No medications on file.  Reports that he takes something for hypertension  ROS: History obtained from the patient  General ROS: negative for - chills, fatigue, fever, night sweats, weight gain or weight loss Psychological ROS: negative for - behavioral disorder, hallucinations, memory difficulties, mood swings or suicidal ideation Ophthalmic ROS: negative for - blurry vision, double vision, eye pain or loss of vision ENT ROS: negative for - epistaxis, nasal discharge, oral lesions, sore throat, tinnitus or vertigo Allergy and Immunology ROS: negative for - hives or itchy/watery eyes Hematological and Lymphatic ROS: negative for - bleeding problems, bruising or swollen lymph nodes Endocrine ROS: negative for - galactorrhea, hair pattern changes, polydipsia/polyuria or temperature intolerance Respiratory ROS: negative for - cough, hemoptysis, shortness of breath or  wheezing Cardiovascular ROS: negative for - chest pain, dyspnea on exertion, edema or irregular heartbeat Gastrointestinal ROS:  abdominal pain Genito-Urinary ROS: negative for - dysuria, hematuria, incontinence or urinary frequency/urgency Musculoskeletal ROS: negative for - joint swelling or muscular weakness Neurological ROS: as noted in HPI Dermatological ROS: negative for rash and skin lesion changes  Physical Examination: Blood pressure 143/110, pulse 97, temperature 98.2 F (36.8 C), temperature source Oral, resp. rate 19, SpO2 98.00%.  Neurologic Examination: Mental Status: Alert.  Follows 3-step commands.  Able to mouth words and use hands to describe what he is trying to say but unable to talk on most occasions.  Intermittently will be able to get out a word or two.  Did say "Am I going home?"  Able to repeat. Cranial Nerves: II: Discs flat bilaterally; Visual fields grossly normal, pupils equal, round, reactive to light and accommodation III,IV, VI: ptosis not present, extra-ocular motions intact bilaterally V,VII: smile symmetric, facial light touch sensation decreased on the left side of the face VIII: hearing normal bilaterally IX,X: gag reflex present XI: bilateral shoulder shrug XII: midline tongue extension Motor: Right : Upper extremity   5/5    Left:     Upper extremity   2/5 Able to maintain arm against gravity at the elbow., guards face  Lower extremity   3/5     Lower extremity   1-2/5 Lifts right leg when left is attempted to be lifted, no reciprocal downward movement Tone and bulk:normal tone throughout; no atrophy noted Sensory: Pinprick and light touch decreased on the left side of the body including the trunk Deep Tendon Reflexes: 2+ and symmetric with absent  AJ's bilaterally Plantars: Right: downgoing   Left: downgoing Cerebellar: normal finger-to-nose and normal heel-to-shin testing on the right Gait: Unable to test CV: pulses palpable throughout      Laboratory Studies:  Basic Metabolic Panel:  Recent Labs Lab 11/15/13 2305  NA 140  K 4.1  CL 104  GLUCOSE 98  BUN 10  CREATININE 1.20    Liver Function Tests: No results found for this basename: AST, ALT, ALKPHOS, BILITOT, PROT, ALBUMIN,  in the last 168 hours No results found for this basename: LIPASE, AMYLASE,  in the last 168 hours No results found for this basename: AMMONIA,  in the last 168 hours  CBC:  Recent Labs Lab 11/15/13 2305 11/15/13 2314  WBC  --  13.5*  NEUTROABS  --  7.6  HGB 20.1* 19.2*  HCT 59.0* 53.5*  MCV  --  87.3  PLT  --  230    Cardiac Enzymes: No results found for this basename: CKTOTAL, CKMB, CKMBINDEX, TROPONINI,  in the last 168 hours  BNP: No components found with this basename: POCBNP,   CBG:  Recent Labs Lab 11/15/13 2305  GLUCAP 90    Microbiology: No results found for this or any previous visit.  Coagulation Studies: No results found for this basename: LABPROT, INR,  in the last 72 hours  Urinalysis: No results found for this basename: COLORURINE, APPERANCEUR, LABSPEC, PHURINE, GLUCOSEU, HGBUR, BILIRUBINUR, KETONESUR, PROTEINUR, UROBILINOGEN, NITRITE, LEUKOCYTESUR,  in the last 168 hours  Lipid Panel: No results found for this basename: chol,  trig,  hdl,  cholhdl,  vldl,  ldlcalc    HgbA1C:  No results found for this basename: HGBA1C    Urine Drug Screen:   No results found for this basename: labopia,  cocainscrnur,  labbenz,  amphetmu,  thcu,  labbarb    Alcohol Level: No results found for this basename: ETH,  in the last 168 hours  Other results: EKG: sinus rhythm at 94 bpm.  Imaging: Ct Head (brain) Wo Contrast  11/15/2013   CLINICAL DATA:  Acute stroke.  EXAM: CT HEAD WITHOUT CONTRAST  TECHNIQUE: Contiguous axial images were obtained from the base of the skull through the vertex without intravenous contrast.  COMPARISON:  None.  FINDINGS: No evidence of intracranial hemorrhage, brain edema, or  other signs of acute infarction. No evidence of intracranial mass lesion or mass effect. No abnormal extraaxial fluid collections identified. Ventricles are normal in size. No skull abnormality identified.  IMPRESSION: Negative noncontrast head CT.  These results were called by telephone at the time of interpretation on 11/15/2013 at 11:11 PM to Dr. Thana Farr, who verbally acknowledged these results.   Electronically Signed   By: Myles Rosenthal M.D.   On: 11/15/2013 23:21    Assessment: 52 y.o. male presenting with complaints of chest pain and a left hemiparesis.  Examination shows multiple functional features but patient with multiple vascular risk factors.  Head CT reviewed and shows no acute changes.  Further work up to rule out acute infarct recommended.    Stroke Risk Factors - hypertension and smoking  Plan: 1. HgbA1c, fasting lipid panel 2. MRI, MRA  of the brain without contrast 3. PT consult, OT consult, Speech consult 4. Echocardiogram 5. Carotid dopplers 6. Prophylactic therapy-Antiplatelet med: Aspirin - dose 325mg  daily 7. BP management, smoking, alcohol and drug cessation counseling recommended 8. Telemetry monitoring 9. Frequent neuro checks  Case discussed with Dr. Marca Ancona, MD [facility] [phone] 11/15/2013, 11:45 PM

## 2013-11-15 NOTE — ED Notes (Signed)
Pt placed on zoll monitor and transported to CT by Lesle Reek RN with EMT.

## 2013-11-15 NOTE — ED Notes (Signed)
Code stroke

## 2013-11-15 NOTE — ED Provider Notes (Signed)
CSN: [id number]     Arrival date & time 11/15/13  2236 History   First MD Initiated Contact with Patient 11/15/13 2300     Chief Complaint  Patient presents with  . Code Stroke   (Consider location/radiation/quality/duration/timing/severity/associated sxs/prior Treatment) Patient is a 52 y.o. male presenting with Acute Neurological Problem. The history is provided by the EMS personnel. The history is limited by the condition of the patient. No language interpreter was used.  Cerebrovascular Accident This is a recurrent problem. Episode onset: unable. The problem occurs constantly. The problem has not changed since onset.Associated symptoms include chest pain. Pertinent negatives include no headaches and no shortness of breath. Nothing aggravates the symptoms. Nothing relieves the symptoms. He has tried nothing for the symptoms. The treatment provided no relief.    History reviewed. No pertinent past medical history. No past surgical history on file. No family history on file. History  Substance Use Topics  . Smoking status: Not on file  . Smokeless tobacco: Not on file  . Alcohol Use: Not on file    Review of Systems  Unable to perform ROS Respiratory: Negative for shortness of breath.   Cardiovascular: Positive for chest pain.  Neurological: Negative for headaches.    Allergies  Review of patient's allergies indicates not on file.  Home Medications  No current outpatient prescriptions on file. BP 143/110  Pulse 97  Temp(Src) 98.2 F (36.8 C) (Oral)  Resp 19  SpO2 98% Physical Exam  Constitutional: He appears well-developed and well-nourished. No distress.  HENT:  Head: Normocephalic and atraumatic.  Right Ear: External ear normal.  Left Ear: External ear normal.  Mouth/Throat: Oropharynx is clear and moist.  Eyes: Conjunctivae and EOM are normal. Pupils are equal, round, and reactive to light.  Neck: Normal range of motion. Neck supple.  Cardiovascular: Normal  rate, regular rhythm and intact distal pulses.   Pulmonary/Chest: Effort normal and breath sounds normal. He has no wheezes. He has no rales.  Abdominal: Soft. Bowel sounds are normal. There is no tenderness. There is no rebound and no guarding.  Musculoskeletal: Normal range of motion. He exhibits no edema.  Neurological: He is alert. He has normal reflexes.  Weakness LUE/LLE  aphasic  Skin: Skin is warm and dry.    ED Course  Procedures (including critical care time) Labs Review Labs Reviewed  POCT I-STAT, CHEM 8 - Abnormal; Notable for the following:    Calcium, Ion 1.09 (*)    Hemoglobin 20.1 (*)    HCT 59.0 (*)    All other components within normal limits  GLUCOSE, CAPILLARY  ETHANOL  PROTIME-INR  APTT  CBC  DIFFERENTIAL  COMPREHENSIVE METABOLIC PANEL  TROPONIN I  URINE RAPID DRUG SCREEN (HOSP PERFORMED)  URINALYSIS, ROUTINE W REFLEX MICROSCOPIC  POCT I-STAT TROPONIN I   Imaging Review Ct Head (brain) Wo Contrast  11/15/2013   CLINICAL DATA:  Acute stroke.  EXAM: CT HEAD WITHOUT CONTRAST  TECHNIQUE: Contiguous axial images were obtained from the base of the skull through the vertex without intravenous contrast.  COMPARISON:  None.  FINDINGS: No evidence of intracranial hemorrhage, brain edema, or other signs of acute infarction. No evidence of intracranial mass lesion or mass effect. No abnormal extraaxial fluid collections identified. Ventricles are normal in size. No skull abnormality identified.  IMPRESSION: Negative noncontrast head CT.  These results were called by telephone at the time of interpretation on 11/15/2013 at 11:11 PM to Dr. Thana Farr, who verbally acknowledged these results.  Electronically Signed   By: Myles Rosenthal M.D.   On: 11/15/2013 23:21    EKG Interpretation   None       MDM  No diagnosis found.  Date: 11/15/2013  Rate: 94  Rhythm: normal sinus rhythm  QRS Axis: normal  Intervals: normal  ST/T Wave abnormalities: normal  Conduction  Disutrbances: none  Narrative Interpretation: unremarkable        Cathyrn Deas K Vadis Slabach-Rasch, MD 11/16/13 [phone]

## 2013-11-16 ENCOUNTER — Encounter (HOSPITAL_COMMUNITY): Payer: Self-pay | Admitting: Emergency Medicine

## 2013-11-16 ENCOUNTER — Emergency Department (HOSPITAL_COMMUNITY): Payer: Medicaid Other

## 2013-11-16 ENCOUNTER — Inpatient Hospital Stay (HOSPITAL_COMMUNITY): Payer: Medicaid Other

## 2013-11-16 DIAGNOSIS — I10 Essential (primary) hypertension: Secondary | ICD-10-CM | POA: Diagnosis present

## 2013-11-16 DIAGNOSIS — R531 Weakness: Secondary | ICD-10-CM | POA: Diagnosis present

## 2013-11-16 DIAGNOSIS — I517 Cardiomegaly: Secondary | ICD-10-CM

## 2013-11-16 DIAGNOSIS — G8190 Hemiplegia, unspecified affecting unspecified side: Secondary | ICD-10-CM

## 2013-11-16 DIAGNOSIS — I472 Ventricular tachycardia: Secondary | ICD-10-CM | POA: Diagnosis present

## 2013-11-16 DIAGNOSIS — R079 Chest pain, unspecified: Secondary | ICD-10-CM

## 2013-11-16 LAB — COMPREHENSIVE METABOLIC PANEL
ALT: 25 U/L (ref 0–53)
AST: 26 U/L (ref 0–37)
Albumin: 3.7 g/dL (ref 3.5–5.2)
Alkaline Phosphatase: 89 U/L (ref 39–117)
BUN: 10 mg/dL (ref 6–23)
Chloride: 104 mEq/L (ref 96–112)
Potassium: 4.2 mEq/L (ref 3.5–5.1)
Sodium: 141 mEq/L (ref 135–145)
Total Bilirubin: 0.5 mg/dL (ref 0.3–1.2)
Total Protein: 7 g/dL (ref 6.0–8.3)

## 2013-11-16 LAB — LIPID PANEL
Cholesterol: 155 mg/dL (ref 0–200)
HDL: 43 mg/dL (ref >39)
LDL Cholesterol: 86 mg/dL (ref 0–99)
Triglycerides: 129 mg/dL (ref <150)
VLDL: 26 mg/dL (ref 0–40)

## 2013-11-16 LAB — RAPID URINE DRUG SCREEN, HOSP PERFORMED
Barbiturates: NOT DETECTED
Cocaine: POSITIVE — AB
Opiates: NOT DETECTED

## 2013-11-16 LAB — CBC WITH DIFFERENTIAL/PLATELET
Basophils Absolute: 0 10*3/uL (ref 0.0–0.1)
Eosinophils Absolute: 0.4 10*3/uL (ref 0.0–0.7)
Eosinophils Relative: 3 % (ref 0–5)
HCT: 54.3 % — ABNORMAL HIGH (ref 39.0–52.0)
Hemoglobin: 19.2 g/dL — ABNORMAL HIGH (ref 13.0–17.0)
MCH: 32 pg (ref 26.0–34.0)
MCHC: 35.4 g/dL (ref 30.0–36.0)
MCV: 90.5 fL (ref 78.0–100.0)
Monocytes Relative: 7 % (ref 3–12)
Platelets: 202 10*3/uL (ref 150–400)
RDW: 12.9 % (ref 11.5–15.5)

## 2013-11-16 LAB — URINALYSIS, ROUTINE W REFLEX MICROSCOPIC
Bilirubin Urine: NEGATIVE
Bilirubin Urine: NEGATIVE
Glucose, UA: NEGATIVE mg/dL
Hgb urine dipstick: NEGATIVE
Ketones, ur: NEGATIVE mg/dL
Leukocytes, UA: NEGATIVE
Nitrite: NEGATIVE
Nitrite: NEGATIVE
Specific Gravity, Urine: 1.004 — ABNORMAL LOW (ref 1.005–1.030)
Specific Gravity, Urine: 1.004 — ABNORMAL LOW (ref 1.005–1.030)
Urobilinogen, UA: 0.2 mg/dL (ref 0.0–1.0)
Urobilinogen, UA: 0.2 mg/dL (ref 0.0–1.0)
pH: 5.5 (ref 5.0–8.0)
pH: 5 (ref 5.0–8.0)

## 2013-11-16 LAB — MAGNESIUM: Magnesium: 2.2 mg/dL (ref 1.5–2.5)

## 2013-11-16 LAB — TSH: TSH: 1.196 u[IU]/mL (ref 0.350–4.500)

## 2013-11-16 MED ORDER — HYDRALAZINE HCL 20 MG/ML IJ SOLN: 10.0000 mg | INTRAMUSCULAR | Status: DC | PRN

## 2013-11-16 MED ORDER — THIAMINE HCL 100 MG/ML IJ SOLN
100.0000 mg | Freq: Every day | INTRAMUSCULAR | Status: DC
Start: 2013-11-16 — End: 2013-11-16
  Filled 2013-11-16: qty 1

## 2013-11-16 MED ORDER — LORAZEPAM 1 MG PO TABS: 1.0000 mg | ORAL_TABLET | Freq: Four times a day (QID) | ORAL | Status: DC | PRN

## 2013-11-16 MED ORDER — LORAZEPAM 2 MG/ML IJ SOLN
1.0000 mg | Freq: Four times a day (QID) | INTRAMUSCULAR | Status: DC | PRN
  Administered 2013-11-16: 1 mg via INTRAVENOUS
  Filled 2013-11-16: qty 1

## 2013-11-16 MED ORDER — INFLUENZA VAC SPLIT QUAD 0.5 ML IM SUSP
0.5000 mL | INTRAMUSCULAR | Status: DC
  Filled 2013-11-16: qty 0.5

## 2013-11-16 MED ORDER — HYDROCODONE-ACETAMINOPHEN 5-325 MG PO TABS: 1.0000 | ORAL_TABLET | ORAL | Status: DC | PRN

## 2013-11-16 MED ORDER — VITAMIN B-1 100 MG PO TABS
100.0000 mg | ORAL_TABLET | Freq: Every day | ORAL | Status: DC
  Administered 2013-11-16: 09:00:00 100 mg via ORAL
  Filled 2013-11-16 (×2): qty 1

## 2013-11-16 MED ORDER — PANTOPRAZOLE SODIUM 40 MG IV SOLR
40.0000 mg | Freq: Every day | INTRAVENOUS | Status: DC
  Administered 2013-11-16: 40 mg via INTRAVENOUS
  Filled 2013-11-16 (×2): qty 40

## 2013-11-16 MED ORDER — ENOXAPARIN SODIUM 40 MG/0.4ML ~~LOC~~ SOLN
40.0000 mg | SUBCUTANEOUS | Status: DC
  Administered 2013-11-16: 40 mg via SUBCUTANEOUS
  Filled 2013-11-16 (×2): qty 0.4

## 2013-11-16 MED ORDER — MAGNESIUM OXIDE 400 (241.3 MG) MG PO TABS
400.0000 mg | ORAL_TABLET | Freq: Two times a day (BID) | ORAL | Status: DC
  Administered 2013-11-16 (×2): 400 mg via ORAL
  Filled 2013-11-16 (×5): qty 1

## 2013-11-16 MED ORDER — SODIUM CHLORIDE 0.9 % IV SOLN: INTRAVENOUS | Status: DC

## 2013-11-16 MED ORDER — LORAZEPAM 2 MG/ML IJ SOLN
0.0000 mg | Freq: Four times a day (QID) | INTRAMUSCULAR | Status: DC
  Administered 2013-11-16 (×3): 4 mg via INTRAVENOUS
  Administered 2013-11-17: 2 mg via INTRAVENOUS
  Filled 2013-11-16: qty 1
  Filled 2013-11-16 (×3): qty 2

## 2013-11-16 MED ORDER — SENNOSIDES-DOCUSATE SODIUM 8.6-50 MG PO TABS: 1.0000 | ORAL_TABLET | Freq: Every evening | ORAL | Status: DC | PRN

## 2013-11-16 MED ORDER — PANTOPRAZOLE SODIUM 40 MG PO TBEC
40.0000 mg | DELAYED_RELEASE_TABLET | Freq: Every day | ORAL | Status: DC
  Administered 2013-11-16: 40 mg via ORAL
  Filled 2013-11-16: qty 1

## 2013-11-16 MED ORDER — ADULT MULTIVITAMIN W/MINERALS CH
1.0000 | ORAL_TABLET | Freq: Every day | ORAL | Status: DC
  Administered 2013-11-16: 1 via ORAL
  Filled 2013-11-16 (×2): qty 1

## 2013-11-16 MED ORDER — LORAZEPAM 2 MG/ML IJ SOLN: 0.0000 mg | Freq: Two times a day (BID) | INTRAMUSCULAR | Status: DC

## 2013-11-16 MED ORDER — FOLIC ACID 1 MG PO TABS
1.0000 mg | ORAL_TABLET | Freq: Every day | ORAL | Status: DC
  Administered 2013-11-16: 1 mg via ORAL
  Filled 2013-11-16 (×2): qty 1

## 2013-11-16 MED ORDER — ASPIRIN 325 MG PO TABS
325.0000 mg | ORAL_TABLET | Freq: Every day | ORAL | Status: DC
  Administered 2013-11-16: 325 mg via ORAL
  Filled 2013-11-16 (×2): qty 1

## 2013-11-16 MED ORDER — ASPIRIN 300 MG RE SUPP
300.0000 mg | Freq: Every day | RECTAL | Status: DC
  Filled 2013-11-16 (×2): qty 1

## 2013-11-16 NOTE — ED Notes (Signed)
Report to [facility] on [address] [place]

## 2013-11-16 NOTE — ED Notes (Signed)
MD at bedside, hospitalist.  

## 2013-11-16 NOTE — ED Notes (Signed)
Spoke to pt sister via phone to check on his history.  She told me that he has hx of alcohol abuse, smokes, esophageal cancer for which he is seen at the esophageal clinic at [place].  His pharmacy is [facility] in [place].  History of "major anxiety" disorder and depression for which he sees a Dr Financial planner in [place] and is taking medication for this.  Last time he had "mini stroke" she told me it was determined that the majority of issues were caused from poor circulation and that he had a stent placed in his left leg but was unable to tell me which vessel.  She reported that he takes a med for anxiety/depression, hypertension and sleep.

## 2013-11-16 NOTE — Progress Notes (Signed)
*  PRELIMINARY RESULTS* Vascular Ultrasound Carotid Duplex (Doppler) has been completed.   Findings suggest 1-39% internal carotid artery stenosis bilaterally. Vertebral arteries are patent with antegrade flow.  11/16/2013 2:23 PM Gertie Fey, RVT, RDCS, RDMS

## 2013-11-16 NOTE — H&P (Signed)
[facility] History and Physical  Ian [name] YQM:[id number] DOB: 31-Dec-1960 DOA: 11/15/2013  Referring physician: ER physician. PCP: No PCP Per Patient   Chief Complaint: Left-sided weakness.  HPI: Ian [name] is a 52 y.o. male with history of hypertension, peripheral vascular disease status post stenting, recently diagnosed esophageal cancer, was brought to the ER after patient was found to have weakness of the left upper and lower extremities with some left facial numbness and left-sided blurred vision. Patient states that the symptoms have been ongoing for last 2 days. It acutely worsened yesterday. In the ER patient was initially found to be confused. CT head did not show anything acute. Neurologist on-call was consulted and patient has been admitted for further workup for possible stroke. Patient in addition also complains of chest pain which is retrosternal and has been there for last 2 days off and on. Patient states he also has been drinking alcohol and last drink was yesterday and has used cocaine day before yesterday. Patient's drug screen is positive for cocaine.  Review of Systems: As presented in the history of presenting illness, rest negative.  Past Medical History  Diagnosis Date  . Cancer   . Hypertension   . Depression   . Anxiety    Past Surgical History  Procedure Laterality Date  . Appendectomy    . Cholecystectomy    . Angioplasty    . Cardiac catheterization     Social History:  reports that he has been smoking.  He does not have any smokeless tobacco history on file. He reports that he drinks alcohol. He reports that he uses illicit drugs (Cocaine and Marijuana). Where does patient live home. Can patient participate in ADLs? Not sure.  No Known Allergies  Family History:  Family History  Problem Relation Age of Onset  . Stroke Father   . Leukemia Father   . Breast cancer Sister       Prior to Admission medications   Not on File     Physical Exam: Filed Vitals:   11/16/13 0056 11/16/13 0100 11/16/13 0115 11/16/13 0201  BP: 133/99 120/79 123/80 121/80  Pulse: 80 78 78 77  Temp: 98.1 F (36.7 C)   98.5 F (36.9 C)  TempSrc: Oral   Oral  Resp: 16 20 25 18   SpO2: 97% 98% 98% 96%     General:  Well-developed well-nourished.  Eyes: Anicteric no pallor.  ENT: No discharge from the ears eyes nose mouth.  Neck: No mass felt.  Cardiovascular: S1-S2 heard.  Respiratory: No rhonchi or crepitations.  Abdomen: Soft nontender bowel sounds present.  Skin: No rash.  Musculoskeletal: No edema.  Psychiatric: Appears normal.  Neurologic: Alert awake oriented to time place and person. Patient has difficulty moving his left upper and lower extremities. Patient also complains of blurred vision left eye. No facial asymmetry. Tongue is midline.  Labs on Admission:  Basic Metabolic Panel:  Recent Labs Lab 11/15/13 2305 11/15/13 2314  NA 140 137  K 4.1 4.1  CL 104 100  CO2  --  22  GLUCOSE 98 92  BUN 10 10  CREATININE 1.20 0.79  CALCIUM  --  9.1   Liver Function Tests:  Recent Labs Lab 11/15/13 2314  AST 25  ALT 25  ALKPHOS 94  BILITOT 0.3  PROT 7.3  ALBUMIN 4.1   No results found for this basename: LIPASE, AMYLASE,  in the last 168 hours No results found for this basename: AMMONIA,  in the  last 168 hours CBC:  Recent Labs Lab 11/15/13 2305 11/15/13 2314  WBC  --  13.5*  NEUTROABS  --  7.6  HGB 20.1* 19.2*  HCT 59.0* 53.5*  MCV  --  87.3  PLT  --  230   Cardiac Enzymes:  Recent Labs Lab 11/15/13 2314  TROPONINI <0.30    BNP (last 3 results) No results found for this basename: PROBNP,  in the last 8760 hours CBG:  Recent Labs Lab 11/15/13 2305  GLUCAP 90    Radiological Exams on Admission: Ct Head (brain) Wo Contrast  11/15/2013   CLINICAL DATA:  Acute stroke.  EXAM: CT HEAD WITHOUT CONTRAST  TECHNIQUE: Contiguous axial images were obtained from the base of the skull  through the vertex without intravenous contrast.  COMPARISON:  None.  FINDINGS: No evidence of intracranial hemorrhage, brain edema, or other signs of acute infarction. No evidence of intracranial mass lesion or mass effect. No abnormal extraaxial fluid collections identified. Ventricles are normal in size. No skull abnormality identified.  IMPRESSION: Negative noncontrast head CT.  These results were called by telephone at the time of interpretation on 11/15/2013 at 11:11 PM to Dr. Thana Farr, who verbally acknowledged these results.   Electronically Signed   By: Myles Rosenthal M.D.   On: 11/15/2013 23:21   Dg Chest Port 1 View  11/16/2013   CLINICAL DATA:  Chest pain.  EXAM: PORTABLE CHEST - 1 VIEW  COMPARISON:  None.  FINDINGS: The heart size and mediastinal contours are within normal limits. Both lungs are clear. The visualized skeletal structures are unremarkable.  IMPRESSION: No active disease.   Electronically Signed   By: Myles Rosenthal M.D.   On: 11/16/2013 01:05    EKG: Independently reviewed. Normal sinus rhythm.  Assessment/Plan Principal Problem:   Hemiplegia, unspecified, affecting nondominant side Active Problems:   Speech and language deficits   Weakness   Chest pain   HTN (hypertension)   Left-sided weakness   1. Left-sided hemiplegia - patient has been admitted for further stroke workup. Follow MRI/MRA brain 2-D echo and carotid Dopplers. Swallow evaluation and neuro checks. Further recommendations per neurologist. Patient is on aspirin. 2. Chest pain - presently chest pain-free. Cycle cardiac markers. Follow 2-D echo. 3. Polysubstance abuse including tobacco alcohol and cocaine - substance abuse counseling requested. Patient is on when necessary Ativan protocol for alcohol withdrawal along with thiamine. 4. Hypertension - patient is presently placed on when necessary IV hydralazine for systolic blood pressure more than 220. Home medications still not known. 5. History of  depression - continue home medications once known. 6. History of esophageal cancer - patient is following up with [facility]. 7. Polycythemia - closely follow CBC. Recheck CBC after hydration.  Patient's home medications has to be verified.    Code Status: Full code.  Family Communication: None.  Disposition Plan: Admit to inpatient.    Fahed Morten N. [facility] Pager [phone].  If 7PM-7AM, please contact night-coverage [email or web address] Password [email or web address] 11/16/2013, 2:35 AM

## 2013-11-16 NOTE — Progress Notes (Signed)
2:16 PM I agree with HPI/GPe and A/P per Dr. 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 with potential right-sided CVA resulting in left hemiparesis.  Neurology has already seen the patient this morning and feel that this is psychogenic Patient states that he is doing fine and actually only has a little bit of left lower extremity discomfort. He has been requesting Vicodin however he is in no pain currently and ambulated around the room as well as outside of the room with the nurse  HEENT alert pleasant oriented no apparent distress CHEST clinically clear CARDIAC S1-S2 no murmur rub or gallop, however on telemetry he had sinus tachycardia, episodes of V. tach and torsades ABDOMEN soft nontender NEURO grossly intact SKIN/MUSCULAR RANGE OF motion intact  Patient Active Problem List   Diagnosis Date Noted  . Weakness 11/16/2013  . Chest pain 11/16/2013  . HTN (hypertension) 11/16/2013  . Left-sided weakness 11/16/2013  . 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 Hemiplegia, unspecified, affecting nondominant side 11/15/2013  . Speech and language deficits 11/15/2013   1. Left-sided hemiplegia-MRI pending, complete evaluation pending. Await MRI to determine further workup. Continue aspirin 2. Nonsustained ventricular tachycardia with torsades-monitor in telemetry-magnesium 2.2. Replace magnesium 200 twice a day and recheck in a.m. If sustains will consult cardiology 3. Signet cell aggressive adenocarcinoma esophagus-patient to discuss with [facility] regarding further plans 4. Hypertension-allow blood pressure elevation for now-reassess in a.m. 5. Polysubstance abuse-will need counseling. Patient will now be discharged with any pain  medications 6. Alcoholism-CIWA 9.  Patient drinks 2 to 3 40s every other day. Monitor for further signs of withdrawal 7. Chest pain-likely cardiogenic-enzymes are negative  Pleas Koch, MD [facility] [phone]

## 2013-11-16 NOTE — ED Notes (Signed)
Difficult to obtain history due to limited communication.  Able to say a word here and there but not enough to get complete history.

## 2013-11-16 NOTE — ED Notes (Signed)
Pt reports facial numbness/tingling much improved.  Now speaking in full sentences, enunciating well.  Reports he thinks this is the same thing he has last time he has TIA/Stroke.

## 2013-11-16 NOTE — Progress Notes (Signed)
Clinical Social Work Department CLINICAL SOCIAL WORK ASSESSMENT 11/16/2013  Patient:  Ian [name]  Account:  [id number]  Admit Date:  11/15/2013  Clinical Social Worker:  Leron Croak, CLINICAL SOCIAL WORKER  Date/Time:  11/16/2013 01:58 PM Referred by:  Physician  Date referred:  11/16/2013 Reason for Referral  Substance Abuse   Presenting Symptoms/Problems (In the person's/family's own words):   Pt stated when asked about drug addiction, "I don't have a problem and don't need help."   Abuse/Neglect/Trauma History (check all that apply)  Denies history   Abuse/Neglect/Trauma Comments:   Psychiatric History (check all that apply)  Outpatient treatment   Psychiatric medications:  Cymbalta and Ativan   Current Mental Health Hospitalizations/Previous Mental Health History:   Pt is seen at [facility] in [place]   Current provider:   Dr. Artist Beach   Place and Date:   [facility] in [place]   Current Medications:   Previous Impatient Admission/Date/Reason:   None Reported   Emotional Health / Current Symptoms    Suicide/Self Harm  None reported   Suicide attempt in the past:   Other harmful behavior:   Psychotic/Dissociative Symptoms  Confusion   Other Psychotic/Dissociative Symptoms:   Pt was confused on admission which has resolved over the last day.    Attention/Behavioral Symptoms  Within Normal Limits   Other Attention / Behavioral Symptoms:    Cognitive Impairment  Within Normal Limits   Other Cognitive Impairment:    Mood and Adjustment  Anxious  DEPRESSION    Stress, Anxiety, Trauma, Any Recent Loss/Stressor  Anxiety  Other - See comment   Anxiety (frequency):   Pt stated that his job has created "lots of anxiety for him and he does not feel like they are helping."   Phobia (specify):   Compulsive behavior (specify):   Obsessive behavior (specify):   Other:   Pt is stressed over his job and relational problems which  he did not want to discuss at this time.   Substance Abuse/Use  Current substance use   SBIRT completed (please refer for detailed history):  N  Self-reported substance use:   Pt last used 11/15/2013 cocaine and stated that he drinks about every 2 weeks.   Urinary Drug Screen Completed:  Y Alcohol level:   183 mg/dL    Environmental/Housing/Living Arrangement  Stable housing   Who is in the home:   Only the Pt   Emergency contact:  [name]  [phone]   Financial  Medicaid   Patient's Strengths and Goals (patient's own words):   Pt is aware of his need to stop drinking and using cocaine.    Pt has a good support system from his sister.   Clinical Social Worker's Interpretive Summary:   CSW met with the Pt at the bedside to discuss d/c planning and Pt's current use of cocaine. CSW introduced self and reason for assessment. Pt was agreeable to speak with the CSW about his drug use, however stated that he was not wanting to change his usage. Pt stated that he uses cocaine every 2-3 days for the last 20 years.  Pt also stated that he uses alcohol every 2 weeks and that he will drink a 12 pack at that time. Pt stated that he uses it to "deal with stress about Pt's job." CSW explained that the usage of these drugs can negatively impact his health. CSW provided a handout for the Pt explaining the psychological and physical effects.    CSW also  informed the Pt that PT/OT has been ordered and based on their recommendations, Pt may need to go to SNF for rehab or HHPT. Pt was agreeable to SNF and outpt PT. Pt stated that he does not want HHPT due to he is a horder and they would not be able to come into the home. CSW will complete a SNF search in [place].   Disposition:  CSW will work Pt up for possible SNF placement and provided Pt with drug addiction resources.

## 2013-11-16 NOTE — Progress Notes (Addendum)
11/16/13 1530  Mobility  Activity Ambulate in hall  Level of Assistance Standby assist, set-up cues, supervision of patient - no hands on  Assistive Device None  Ambulation Response Tolerated well    Patent did not use any assisted devices but was holding on to IV pole.

## 2013-11-16 NOTE — Progress Notes (Signed)
  Echocardiogram 2D Echocardiogram has been performed.  Ian [name] 11/16/2013, 11:16 AM

## 2013-11-16 NOTE — Evaluation (Signed)
Clinical/Bedside Swallow Evaluation Patient Details  Name: Ian [name] MRN: [id number] Date of Birth: Sep 20, 1961  Today's Date: 11/16/2013 Time: 1010-1025 SLP Time Calculation (min): 15 min  Past Medical History:  Past Medical History  Diagnosis Date  . Cancer   . Hypertension   . Depression   . Anxiety    Past Surgical History:  Past Surgical History  Procedure Laterality Date  . Appendectomy    . Cholecystectomy    . Angioplasty    . Cardiac catheterization     HPI:  Ian [name] is an 52 y.o. male who is a poor historian. It seems that the patient has been drinking today and has had some cocaine as well. Was eating and became choked, vomited and then experienced chest pain. EMS was called at that time but it seems that hours prior to that he experienced left sided weakness and numbness as well. Per neuro: Examination shows multiple functional features but patient with multiple vascular risk factors. Head CT reviewed and shows no acute changes. Further work up to rule out acute infarct recommended   Assessment / Plan / Recommendation Clinical Impression  Pt presents with history of esophageal dysphagia. Pt reports dx of Barrett's Esophagus, has undergone endoscopy and barium swallow at [facility] per pt. He reports occasional sensation of stasis and regurgitation of food/liquid. Today pt tolerates POs without evidence of acute dysphagia. He has very little dentition, but masticates graham cracker without struggle. Recommend initiating a dys 3 (mechanical soft) diet to compensate for esophageal and dental impairment. SLP educated pt on esophageal precautions, pt confirmed awareness of these strategies. No acute SLP needs at this time. Will address cognitive linguistic eval on 11/28 as order does not start until 11/27.     Aspiration Risk  Mild    Diet Recommendation Dysphagia 3 (Mechanical Soft);Thin liquid   Liquid Administration via: Cup;Straw Medication Administration:  Whole meds with liquid Supervision: Patient able to self feed Postural Changes and/or Swallow Maneuvers: Seated upright 90 degrees    Other  Recommendations Oral Care Recommendations: Oral care BID   Follow Up Recommendations  None    Frequency and Duration        Pertinent Vitals/Pain NA    SLP Swallow Goals     Swallow Study Prior Functional Status       General HPI: Ian [name] is an 52 y.o. male who is a poor historian. It seems that the patient has been drinking today and has had some cocaine as well. Was eating and became choked, vomited and then experienced chest pain. EMS was called at that time but it seems that hours prior to that he experienced left sided weakness and numbness as well. Per neuro: Examination shows multiple functional features but patient with multiple vascular risk factors. Head CT reviewed and shows no acute changes. Further work up to rule out acute infarct recommended Type of Study: Bedside swallow evaluation Diet Prior to this Study: NPO Temperature Spikes Noted: No Respiratory Status: Room air History of Recent Intubation: No Behavior/Cognition: Alert;Cooperative Oral Cavity - Dentition: Poor condition;Missing dentition Self-Feeding Abilities: Able to feed self Patient Positioning: Upright in bed Baseline Vocal Quality: Clear Volitional Cough: Strong Volitional Swallow: Able to elicit    Oral/Motor/Sensory Function Overall Oral Motor/Sensory Function: Appears within functional limits for tasks assessed   Ice Chips Ice chips: Within functional limits   Thin Liquid Thin Liquid: Within functional limits Presentation: Cup;Straw;Self Fed    Nectar Thick Nectar Thick Liquid: Not  tested   Honey Thick Honey Thick Liquid: Not tested   Puree Puree: Within functional limits   Solid   GO    Solid: Within functional limits      [facility], MA CCC-SLP [phone]  Elsa Ploch, Riley Nearing 11/16/2013,10:43 AM

## 2013-11-16 NOTE — Progress Notes (Signed)
NEURO HOSPITALIST PROGRESS NOTE   SUBJECTIVE:                                                                                                                        Main complaint is chest pain and anxiety. States his left face no longer has paresthesia but his left arm and trunk have paresthesia.   OBJECTIVE:                                                                                                                           Vital signs in last 24 hours: Temp:  [97.5 F (36.4 C)-98.5 F (36.9 C)] 97.5 F (36.4 C) (11/26 0943) Pulse Rate:  [77-97] 80 (11/26 0943) Resp:  [15-25] 18 (11/26 0943) BP: (120-152)/(71-113) 141/90 mmHg (11/26 0943) SpO2:  [94 %-98 %] 97 % (11/26 0943)  Intake/Output from previous day:   Intake/Output this shift: Total I/O In: -  Out: 300 [Urine:300] Nutritional status: Dysphagia  Past Medical History  Diagnosis Date  . Cancer   . Hypertension   . Depression   . Anxiety      Neurologic Exam:  Mental Status: Alert, oriented, thought content appropriate.  Speech fluent without evidence of aphasia.  Able to follow 3 step commands without difficulty. Cranial Nerves: II: Discs flat bilaterally; Visual fields grossly normal, pupils equal, round, reactive to light and accommodation III,IV, VI: ptosis not present, extra-ocular motions intact bilaterally V,VII: smile symmetric, facial light touch sensation normal bilaterally VIII: hearing normal bilaterally IX,X: gag reflex present XI: bilateral shoulder shrug XII: midline tongue extension without atrophy or fasciculations  Motor: Right : Upper extremity   5/5    Left:     Upper extremity   3/5  Lower extremity   5/5     Lower extremity   3/5  --On exam patient is not showing good strength with left UE but when not paying attention he is using his left arm to pull himself up in bed, adjust his body and moving pushing with his left leg.  Tone and  bulk:normal tone throughout; no atrophy noted Sensory: Pinprick and light touch stated to be decreased along whole thorax and right arm.  Deep Tendon Reflexes:  Right: Upper Extremity  Left: Upper extremity   biceps (C-5 to C-6) 2/4   biceps (C-5 to C-6) 2/4 tricep (C7) 2/4    triceps (C7) 2/4 Brachioradialis (C6) 2/4  Brachioradialis (C6) 2/4  Lower Extremity Lower Extremity  quadriceps (L-2 to L-4) 2/4   quadriceps (L-2 to L-4) 2/4 Achilles (S1) 2/4   Achilles (S1) 2/4  --Both patella reflexes are brisk  Plantars: Right: downgoing   Left: downgoing Cerebellar: normal finger-to-nose,  normal heel-to-shin test CV: pulses palpable throughout    Lab Results: Lab Results  Component Value Date/Time   CHOL 155 11/16/2013  5:20 AM   Lipid Panel  Recent Labs  11/16/13 0520  CHOL 155  TRIG 129  HDL 43  CHOLHDL 3.6  VLDL 26  LDLCALC 86    Studies/Results: Ct Head (brain) Wo Contrast  11/15/2013   CLINICAL DATA:  Acute stroke.  EXAM: CT HEAD WITHOUT CONTRAST  TECHNIQUE: Contiguous axial images were obtained from the base of the skull through the vertex without intravenous contrast.  COMPARISON:  None.  FINDINGS: No evidence of intracranial hemorrhage, brain edema, or other signs of acute infarction. No evidence of intracranial mass lesion or mass effect. No abnormal extraaxial fluid collections identified. Ventricles are normal in size. No skull abnormality identified.  IMPRESSION: Negative noncontrast head CT.  These results were called by telephone at the time of interpretation on 11/15/2013 at 11:11 PM to Dr. Thana Farr, who verbally acknowledged these results.   Electronically Signed   By: Myles Rosenthal M.D.   On: 11/15/2013 23:21   Dg Chest Port 1 View  11/16/2013   CLINICAL DATA:  Chest pain.  EXAM: PORTABLE CHEST - 1 VIEW  COMPARISON:  None.  FINDINGS: The heart size and mediastinal contours are within normal limits. Both lungs are clear. The visualized skeletal  structures are unremarkable.  IMPRESSION: No active disease.   Electronically Signed   By: Myles Rosenthal M.D.   On: 11/16/2013 01:05    MEDICATIONS                                                                                                                        Scheduled: . aspirin  300 mg Rectal Daily   Or  . aspirin  325 mg Oral Daily  . enoxaparin (LOVENOX) injection  40 mg Subcutaneous Q24H  . folic acid  1 mg Oral Daily  . LORazepam  0-4 mg Intravenous Q6H   Followed by  . [START ON 11/18/2013] LORazepam  0-4 mg Intravenous Q12H  . magnesium oxide  400 mg Oral BID  . multivitamin with minerals  1 tablet Oral Daily  . pantoprazole  40 mg Oral Q1200  . thiamine  100 mg Oral Daily    ASSESSMENT/PLAN:  52 y.o. male presenting with complaints of chest pain and a left hemiparesis. Examination shows multiple functional features but patient with multiple vascular risk factors. patient continues to show give way strength on the left arm and leg. MRI brain is pending.   Will continue to follow.    Assessment and plan discussed with with attending physician and they are in agreement.    Ermon Sagan PA-C [facility] [phone]  11/16/2013, 12:22 PM   I have seen and evaluated the patient. I have reviewed the above note and made appropriate changes.     Left arm and leg exam have multiple inconsistencies consistent with psychogenic weakness. His MRI is pending, but I feel that this is very much consistent with functional etiology. Will need MRI to rule out embellishment of underlying true deficits, but if this is negative then no further neurodiagnostic evaluation is needed.   Ritta Slot, MD [facility] [phone]  If 7pm- 7am, please page neurology on call at [phone].

## 2013-11-17 DIAGNOSIS — R5381 Other malaise: Secondary | ICD-10-CM

## 2013-11-17 MED ORDER — QUETIAPINE FUMARATE 50 MG PO TABS: 50.0000 mg | ORAL_TABLET | Freq: Every day | ORAL | Status: DC

## 2013-11-17 MED ORDER — QUETIAPINE FUMARATE 300 MG PO TABS: 300.0000 mg | ORAL_TABLET | Freq: Every day | ORAL | Status: DC

## 2013-11-17 NOTE — Progress Notes (Signed)
Subjective: Has had some improvement. Was able to walk yesterday  Exam: Filed Vitals:   11/17/13 0547  BP: 125/72  Pulse: 73  Temp: 97.1 F (36.2 C)  Resp: 18   Gen: In bed, NAD MS: Awake, Alert, oriented and appropriate AV:WUJWJ, EOMI Motor: Grossly inconsistent exam(won't flex knee when tested, but when testing extension, he clearly has at least 4/5 strength in it has 1/5 hip flexion to confrontational testing but when positioned differently and distracted keeps the leg elevated)  MRI  - no infarct  Impression: Left sided weakness that is most consistent with a psychogenic etiology. He had facial invovlement and his MRI is negative. With normal MRI, I do not feel that any further evaluation is needed. No signs that ischemia either TIA or stroke are responsible for his current presentation.   Recommendations: 1) Neurology will sign off, please call with any further questions.   Ritta Slot, MD [facility] [phone]  If 7pm- 7am, please page neurology on call at [phone].

## 2013-11-17 NOTE — Discharge Summary (Signed)
Physician Discharge Summary  Ian [name] AVW:[id number] DOB: Mar 26, 1961 DOA: 11/15/2013  PCP: No PCP Per Patient  Admit date: 11/15/2013 Discharge date: 11/17/2013  Time spent: 20 minutes  Recommendations for Outpatient Follow-up:  1. Please followup with her regular physician 2. Please followup with [facility] health care for further management of a esophagus carcinoma    Discharge Diagnoses:  Principal Problem:   Hemiplegia, unspecified, affecting nondominant side Active Problems:   Speech and language deficits   Weakness   Chest pain   HTN (hypertension)   Left-sided weakness   Torsades de pointes   Discharge Condition: Stable  Diet recommendation: Heart healthy low-salt  There were no vitals filed for this visit.  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 with potential right-sided CVA resulting in left hemiparesis. 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 He has been requesting Vicodin however he is in no pain currently and ambulated around the room as well as outside of the room with the nurse Patient is known to also drink alcohol, 1 to 2 40s and was initially on the CIWA protocol.   He is known to have a psychiatrist who he gets Ativan and Seroquel from. He states that he doesn't have any further Prescriptions. I have called in his Seroquel for him and he'll be discharged to home as he has completely resolved and does not have any further deficits at this time. Neurology once again feels that this is a psychogenic  etiology  Hospital Course:  See above  Procedures:  MRI brain  (i.e. Studies not automatically included, echos, thoracentesis, etc; not x-rays)  Consultations:  Neurology  Discharge Exam: Filed Vitals:   11/17/13 0547  BP: 125/72  Pulse: 73  Temp: 97.1 F (36.2 C)  Resp: 18    General: EOMI  Cardiovascular: S1-S2 no murmur or gallop  Respiratory: Clinically clear Patient has no weakness whatsoever and moves all 4 limbs equally. Power is 5/5 patient has no disconjugate movements is tremulous to some extent which resolves when he ambulates  Discharge Instructions  Discharge Orders   Future Orders Complete By Expires   Diet - low sodium heart healthy  As directed    Discharge instructions  As directed    Comments:     Please see your psychiatrist Please follow up with your regular MD I would strongly recommend you quit smoking and drinking   Increase activity slowly  As directed        Medication List         clonazePAM 1 MG tablet  Commonly known as:  KLONOPIN  Take 0.5-1 mg by mouth 2 (two) times daily as needed for anxiety.     omeprazole 40 MG capsule  Commonly known as:  PRILOSEC  Take 40 mg by mouth 2 (two) times daily.     oxyCODONE-acetaminophen 5-325 MG per tablet  Commonly known as:  PERCOCET/ROXICET  Take 1 tablet by mouth every 4 (four) hours as needed for severe pain.     QUEtiapine 300 MG tablet  Commonly known as:  SEROQUEL  Take 1 tablet (300 mg total) by mouth  at bedtime.     QUEtiapine 50 MG tablet  Commonly known as:  SEROQUEL  Take 1 tablet (50 mg total) by mouth daily at 12 noon.     sucralfate 1 GM/10ML suspension  Commonly known as:  CARAFATE  Take 1 g by mouth 4 (four) times daily.       No Known Allergies    The results of significant diagnostics from this hospitalization (including imaging, microbiology, ancillary and laboratory) are listed below for reference.    Significant Diagnostic Studies: Ct Head (brain) Wo  Contrast  11/15/2013   CLINICAL DATA:  Acute stroke.  EXAM: CT HEAD WITHOUT CONTRAST  TECHNIQUE: Contiguous axial images were obtained from the base of the skull through the vertex without intravenous contrast.  COMPARISON:  None.  FINDINGS: No evidence of intracranial hemorrhage, brain edema, or other signs of acute infarction. No evidence of intracranial mass lesion or mass effect. No abnormal extraaxial fluid collections identified. Ventricles are normal in size. No skull abnormality identified.  IMPRESSION: Negative noncontrast head CT.  These results were called by telephone at the time of interpretation on 11/15/2013 at 11:11 PM to Dr. Thana Farr, who verbally acknowledged these results.   Electronically Signed   By: Myles Rosenthal M.D.   On: 11/15/2013 23:21   Mr Brain Wo Contrast  11/17/2013   CLINICAL DATA:  Left-sided hemiplegia.  EXAM: MRI HEAD WITHOUT CONTRAST  MRA HEAD WITHOUT CONTRAST  TECHNIQUE: Multiplanar, multiecho pulse sequences of the brain and surrounding structures were obtained without intravenous contrast. Angiographic images of the head were obtained using MRA technique without contrast.  COMPARISON:  None.  CT from 11/15/2013.  FINDINGS: MRI HEAD FINDINGS  Study is somewhat degraded by motion artifact.  A few scattered foci of hyperintense T2/FLAIR signal are seen within the periventricular and deep white matter, nonspecific, but likely related chronic small vessel ischemic changes. No mass lesion or midline shift identified. Ventricles are normal in size without evidence of hydrocephalus. There is no extra-axial fluid collection.  No diffusion-weighted signal abnormality is identified to suggest acute intracranial infarct. No intracranial hemorrhage seen on gradient echo sequence.  The craniocervical junction is within normal limits. Evaluation pituitary gland is somewhat limited due to motion artifact. No significant atrophy identified. Orbits, optic nerves, and optic chiasm  demonstrate a normal appearance.  Calvarium is intact with normal signal intensity. Scalp soft tissues are within normal limits.  Polypoid opacity is noted within the right maxillary sinus. Paranasal sinuses are otherwise clear. No mastoid effusion.  MRA HEAD FINDINGS  Study is somewhat degraded by motion artifact.  Visualized portions of the internal carotid arteries are widely patent with antegrade flow. The A1 segments, anterior communicating artery, anterior cerebral arteries, and middle cerebral arteries are widely patent without evidence of high-grade stenosis. No intracranial aneurysm identified within the anterior circulation. Posterior communicating arteries are within normal limits.  Vertebral arteries are widely patent. Posterior inferior cerebellar arteries are normal. The basilar artery is widely patent without evidence of high-grade stenosis. No basilar tip aneurysm. Superior cerebellar arteries, posterior cerebral arteries are within normal limits. Fetal origin of the left PCA is noted. No high-grade stenosis or intracranial aneurysm identified within the posterior circulation.  IMPRESSION: MRI HEAD:  1. Normal MRI of the head without evidence of acute intracranial infarct or other abnormality. 2. Right maxillary sinus disease. MRA HEAD:  1. Normal MRA of the head without evidence of high-grade flow-limiting stenosis or intracranial aneurysm.   Electronically Signed   By:  Rise Mu M.D.   On: 11/17/2013 00:06   Dg Chest Port 1 View  11/16/2013   CLINICAL DATA:  Chest pain.  EXAM: PORTABLE CHEST - 1 VIEW  COMPARISON:  None.  FINDINGS: The heart size and mediastinal contours are within normal limits. Both lungs are clear. The visualized skeletal structures are unremarkable.  IMPRESSION: No active disease.   Electronically Signed   By: Myles Rosenthal M.D.   On: 11/16/2013 01:05   Mr Maxine Glenn Head/brain Wo Cm  11/17/2013   CLINICAL DATA:  Left-sided hemiplegia.  EXAM: MRI HEAD WITHOUT CONTRAST   MRA HEAD WITHOUT CONTRAST  TECHNIQUE: Multiplanar, multiecho pulse sequences of the brain and surrounding structures were obtained without intravenous contrast. Angiographic images of the head were obtained using MRA technique without contrast.  COMPARISON:  None.  CT from 11/15/2013.  FINDINGS: MRI HEAD FINDINGS  Study is somewhat degraded by motion artifact.  A few scattered foci of hyperintense T2/FLAIR signal are seen within the periventricular and deep white matter, nonspecific, but likely related chronic small vessel ischemic changes. No mass lesion or midline shift identified. Ventricles are normal in size without evidence of hydrocephalus. There is no extra-axial fluid collection.  No diffusion-weighted signal abnormality is identified to suggest acute intracranial infarct. No intracranial hemorrhage seen on gradient echo sequence.  The craniocervical junction is within normal limits. Evaluation pituitary gland is somewhat limited due to motion artifact. No significant atrophy identified. Orbits, optic nerves, and optic chiasm demonstrate a normal appearance.  Calvarium is intact with normal signal intensity. Scalp soft tissues are within normal limits.  Polypoid opacity is noted within the right maxillary sinus. Paranasal sinuses are otherwise clear. No mastoid effusion.  MRA HEAD FINDINGS  Study is somewhat degraded by motion artifact.  Visualized portions of the internal carotid arteries are widely patent with antegrade flow. The A1 segments, anterior communicating artery, anterior cerebral arteries, and middle cerebral arteries are widely patent without evidence of high-grade stenosis. No intracranial aneurysm identified within the anterior circulation. Posterior communicating arteries are within normal limits.  Vertebral arteries are widely patent. Posterior inferior cerebellar arteries are normal. The basilar artery is widely patent without evidence of high-grade stenosis. No basilar tip aneurysm.  Superior cerebellar arteries, posterior cerebral arteries are within normal limits. Fetal origin of the left PCA is noted. No high-grade stenosis or intracranial aneurysm identified within the posterior circulation.  IMPRESSION: MRI HEAD:  1. Normal MRI of the head without evidence of acute intracranial infarct or other abnormality. 2. Right maxillary sinus disease. MRA HEAD:  1. Normal MRA of the head without evidence of high-grade flow-limiting stenosis or intracranial aneurysm.   Electronically Signed   By: Rise Mu M.D.   On: 11/17/2013 00:06    Microbiology: No results found for this or any previous visit (from the past 240 hour(s)).   Labs: Basic Metabolic Panel:  Recent Labs Lab 11/15/13 2305 11/15/13 2314 11/16/13 0520 11/16/13 0945  NA 140 137 141  --   K 4.1 4.1 4.2  --   CL 104 100 104  --   CO2  --  22 23  --   GLUCOSE 98 92 87  --   BUN 10 10 10   --   CREATININE 1.20 0.79 0.90  --   CALCIUM  --  9.1 8.9  --   MG  --   --   --  2.2   Liver Function Tests:  Recent Labs Lab 11/15/13 2314 11/16/13 0520  AST  25 26  ALT 25 25  ALKPHOS 94 89  BILITOT 0.3 0.5  PROT 7.3 7.0  ALBUMIN 4.1 3.7   No results found for this basename: LIPASE, AMYLASE,  in the last 168 hours No results found for this basename: AMMONIA,  in the last 168 hours CBC:  Recent Labs Lab 11/15/13 2305 11/15/13 2314 11/16/13 0520  WBC  --  13.5* 10.7*  NEUTROABS  --  7.6 5.7  HGB 20.1* 19.2* 19.2*  HCT 59.0* 53.5* 54.3*  MCV  --  87.3 90.5  PLT  --  230 202   Cardiac Enzymes:  Recent Labs Lab 11/15/13 2314 11/16/13 0520 11/16/13 0946 11/16/13 1555  TROPONINI <0.30 <0.30 <0.30 <0.30   BNP: BNP (last 3 results) No results found for this basename: PROBNP,  in the last 8760 hours CBG:  Recent Labs Lab 11/15/13 2305  GLUCAP 90       Signed:  Rhetta Mura  [facility] 11/17/2013, 9:19 AM

## 2013-11-17 NOTE — Progress Notes (Signed)
PT Cancellation Note  Patient Details Name: Ian [name] MRN: [id number] DOB: [birth year]   Cancelled Treatment:    Reason Eval/Treat Not Completed: Other (comment) (RN screened Pt; no needs identified. pt is independent )will sign off at this time.    [place], [place], PT  11/17/2013, 9:46 AM

## 2013-11-17 NOTE — Progress Notes (Signed)
Discharge instructions given. Pt verbalized understanding and all questions were answered.  

## 2013-12-11 ENCOUNTER — Emergency Department: Payer: Self-pay | Admitting: Emergency Medicine

## 2013-12-11 LAB — BASIC METABOLIC PANEL
Anion Gap: 5 — ABNORMAL LOW (ref 7–16)
Calcium, Total: 9.4 mg/dL (ref 8.5–10.1)
Chloride: 103 mmol/L (ref 98–107)
Co2: 29 mmol/L (ref 21–32)
Creatinine: 0.81 mg/dL (ref 0.60–1.30)
EGFR (Non-African Amer.): >60
Glucose: 93 mg/dL (ref 65–99)
Osmolality: 272 (ref 275–301)
Potassium: 4 mmol/L (ref 3.5–5.1)

## 2013-12-11 LAB — CBC
HGB: 18.3 g/dL — ABNORMAL HIGH (ref 13.0–18.0)
MCH: 29.9 pg (ref 26.0–34.0)
MCV: 88 fL (ref 80–100)
RDW: 13.5 % (ref 11.5–14.5)
WBC: 14.1 10*3/uL — ABNORMAL HIGH (ref 3.8–10.6)

## 2013-12-11 LAB — TROPONIN I: Troponin-I: <0.02 ng/mL

## 2013-12-31 IMAGING — CT CT HEAD WITHOUT CONTRAST
2 series · 16 of 30 positions shown, 20 images · non-contrast
Comparison: none

REASON FOR EXAM: L sided numbness and weakness
COMMENTS:

PROCEDURE:     CT  - CT HEAD WITHOUT CONTRAST  - March 05, 2013 [DATE]
RESULT:     Comparison:  12/24/2012
TECHNIQUE: Multiple axial images from the foramen magnum to the vertex were
obtained without IV contrast.

[Series 4: soft (id) · axial · 0.45mm/px · z∈[-145,-18]mm · 13 of 32 slices shown, 17 images]
[im 3/32  brain]
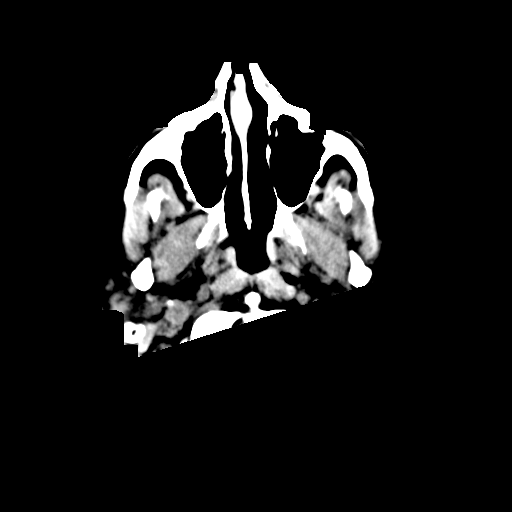
[im 3/32  bone]
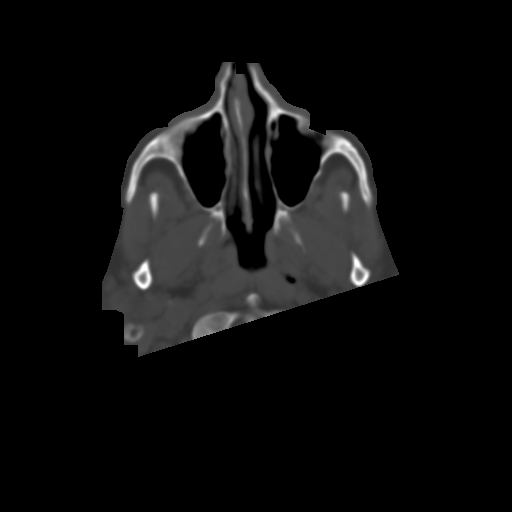
[im 5/32  brain]
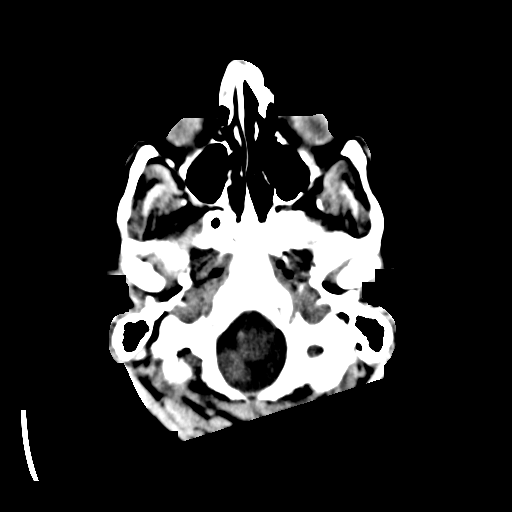
[im 7/32  brain]
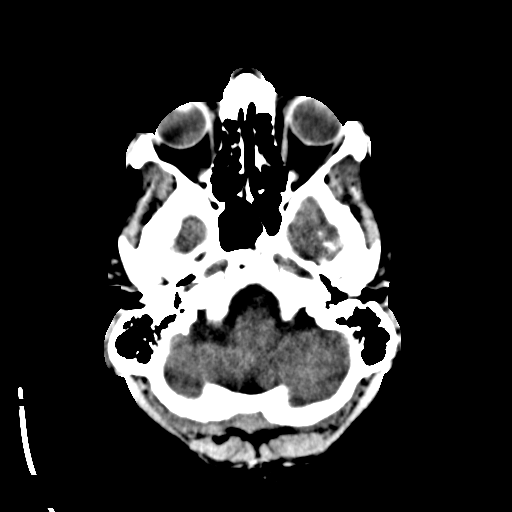
[im 9/32  brain]
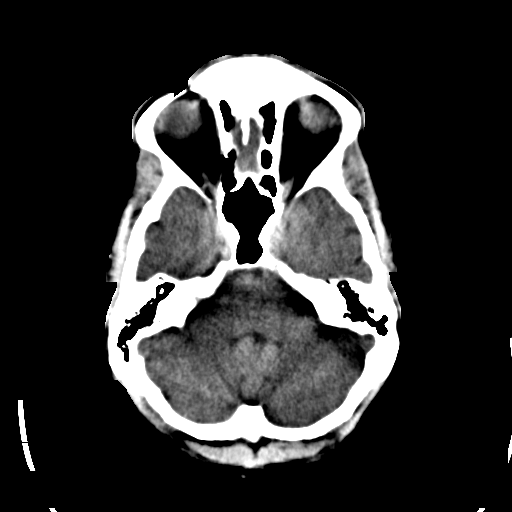
[im 12/32  brain]
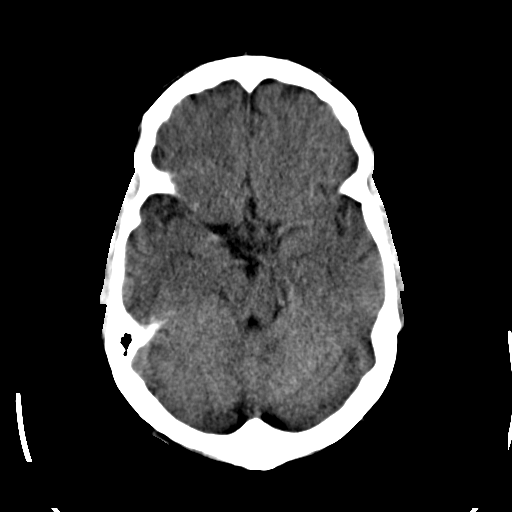
[im 12/32  bone]
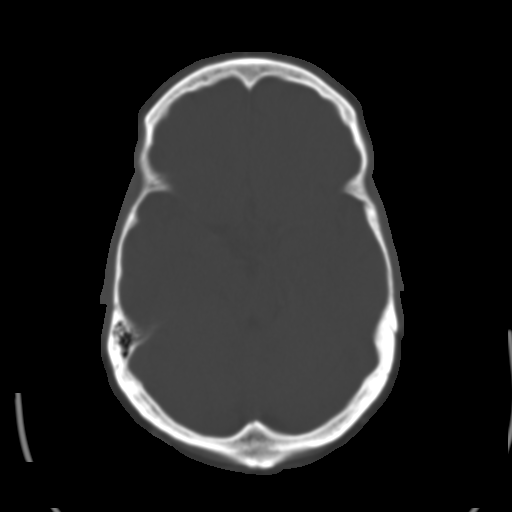
[im 14/32  brain]
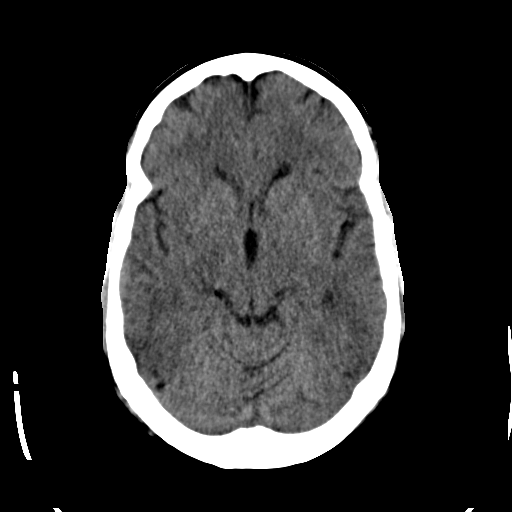
[im 16/32  brain]
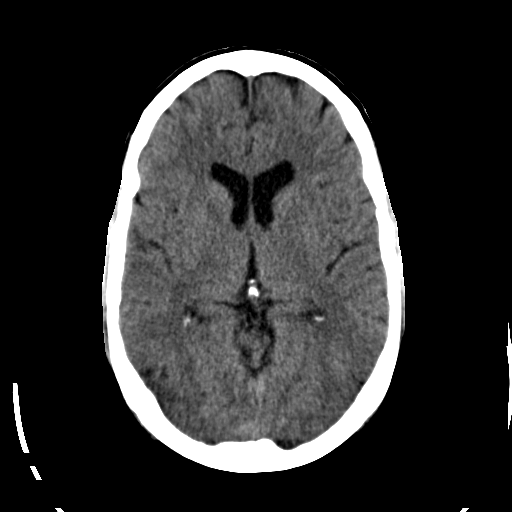
[im 18/32  brain]
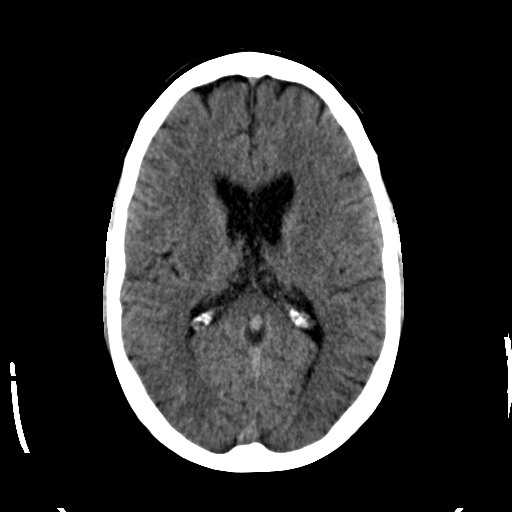
[im 20/32  brain]
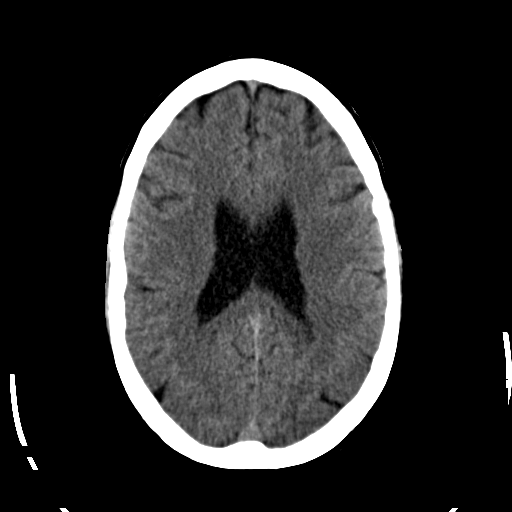
[im 20/32  bone]
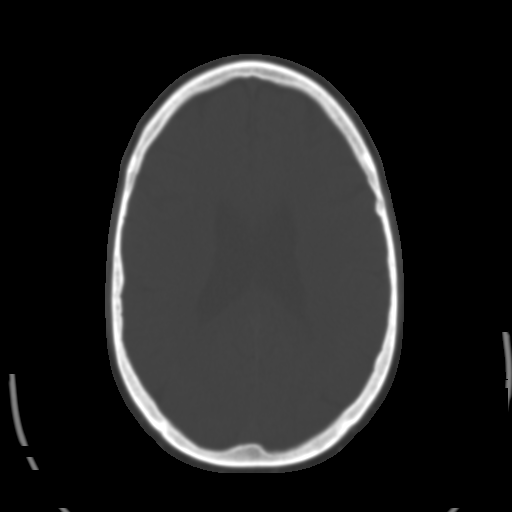
[im 23/32  brain]
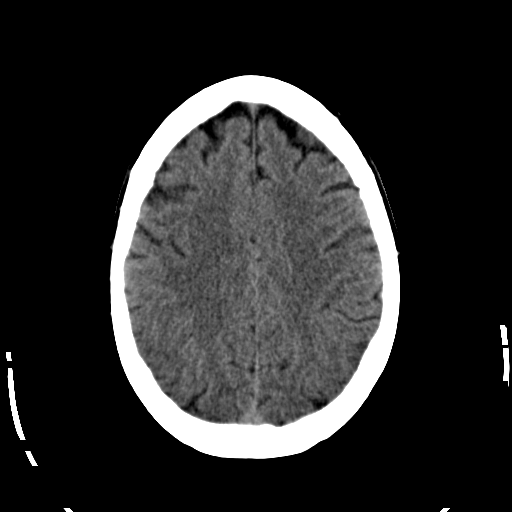
[im 25/32  brain]
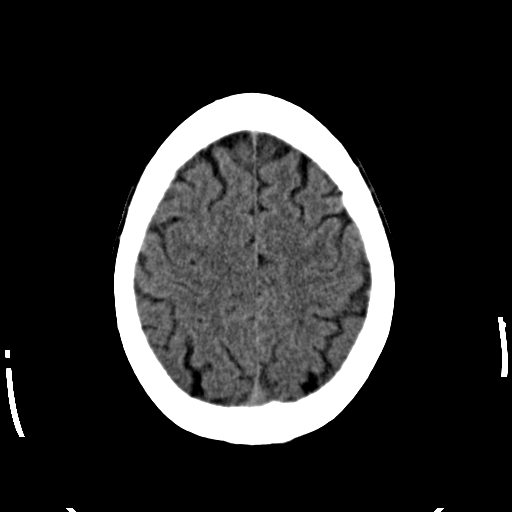
[im 27/32  brain]
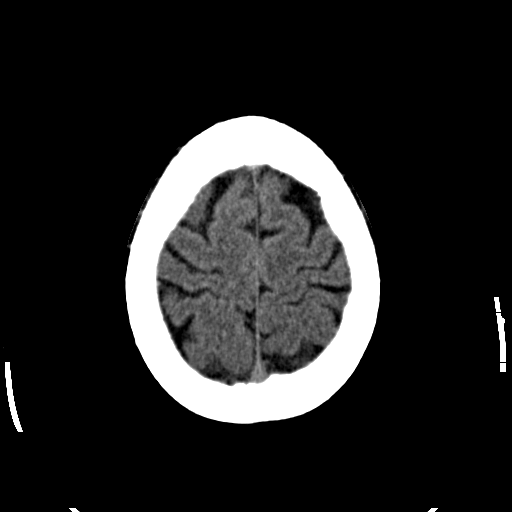
[im 29/32  brain]
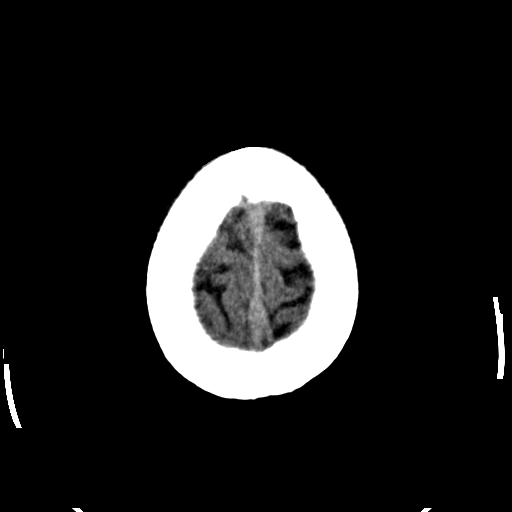
[im 29/32  bone]
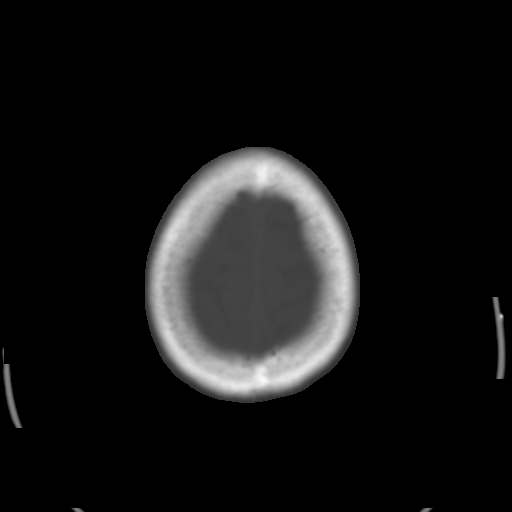

[Series 5: (id) · axial · 0.45mm/px · z∈[-145,-101]mm · 3 of 32 slices shown]
[im 3/32  brain]
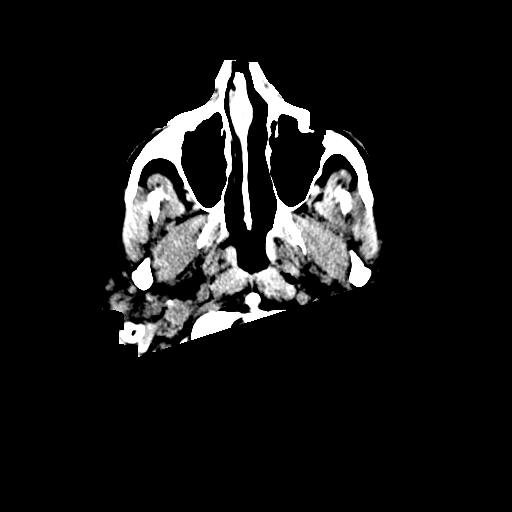
[im 7/32  brain]
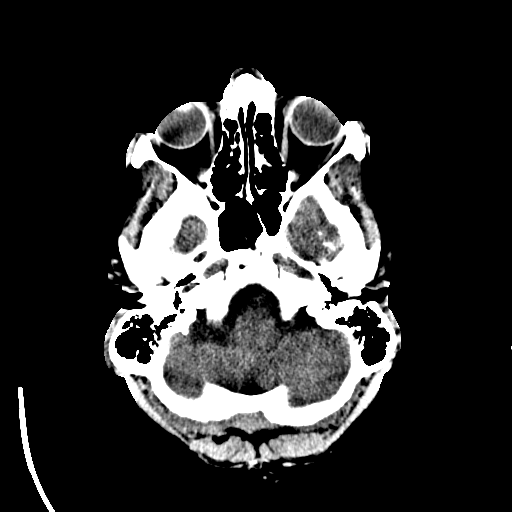
[im 12/32  brain]
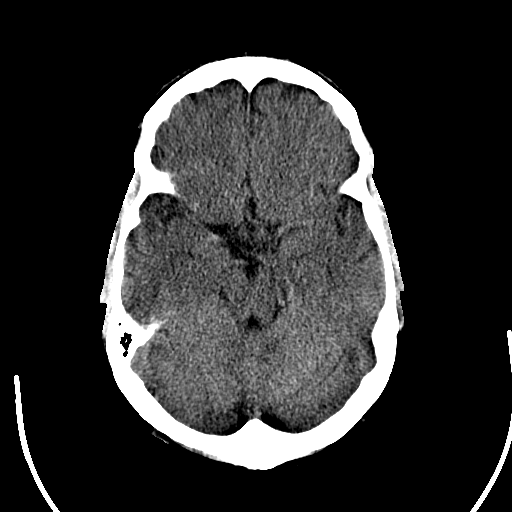

[16 of 30 positions shown; findings below may reference images not displayed]

FINDINGS: There is no evidence for mass effect, midline shift, or extra-axial fluid
collections. There is no evidence for space-occupying lesion, intracranial
hemorrhage, or cortical-based area of infarction.

There is a polypoid mucus retention cyst in the right maxillary sinus.

The osseous structures are unremarkable.
IMPRESSION: No acute intracranial process.

CT can underestimate ischemia in the first 24 hours after the event. If
there is clinical concern for an acute infarct, a followup MRI or repeat CT
scan in 24 hours may provide additional information.

[REDACTED]

## 2013-12-31 IMAGING — CR DG CHEST 1V
1 series · 2 of 2 positions shown · non-contrast
Comparison: none

REASON FOR EXAM: chest pain
COMMENTS:

PROCEDURE:     DXR - DXR CHEST 1 VIEWAP OR PA  - March 05, 2013 [DATE]
RESULT:     Comparison: 10/30/2012

[Series 1: x chest ap · 0.14mm/px · 2 of 2 slices shown]
[im 1/2]
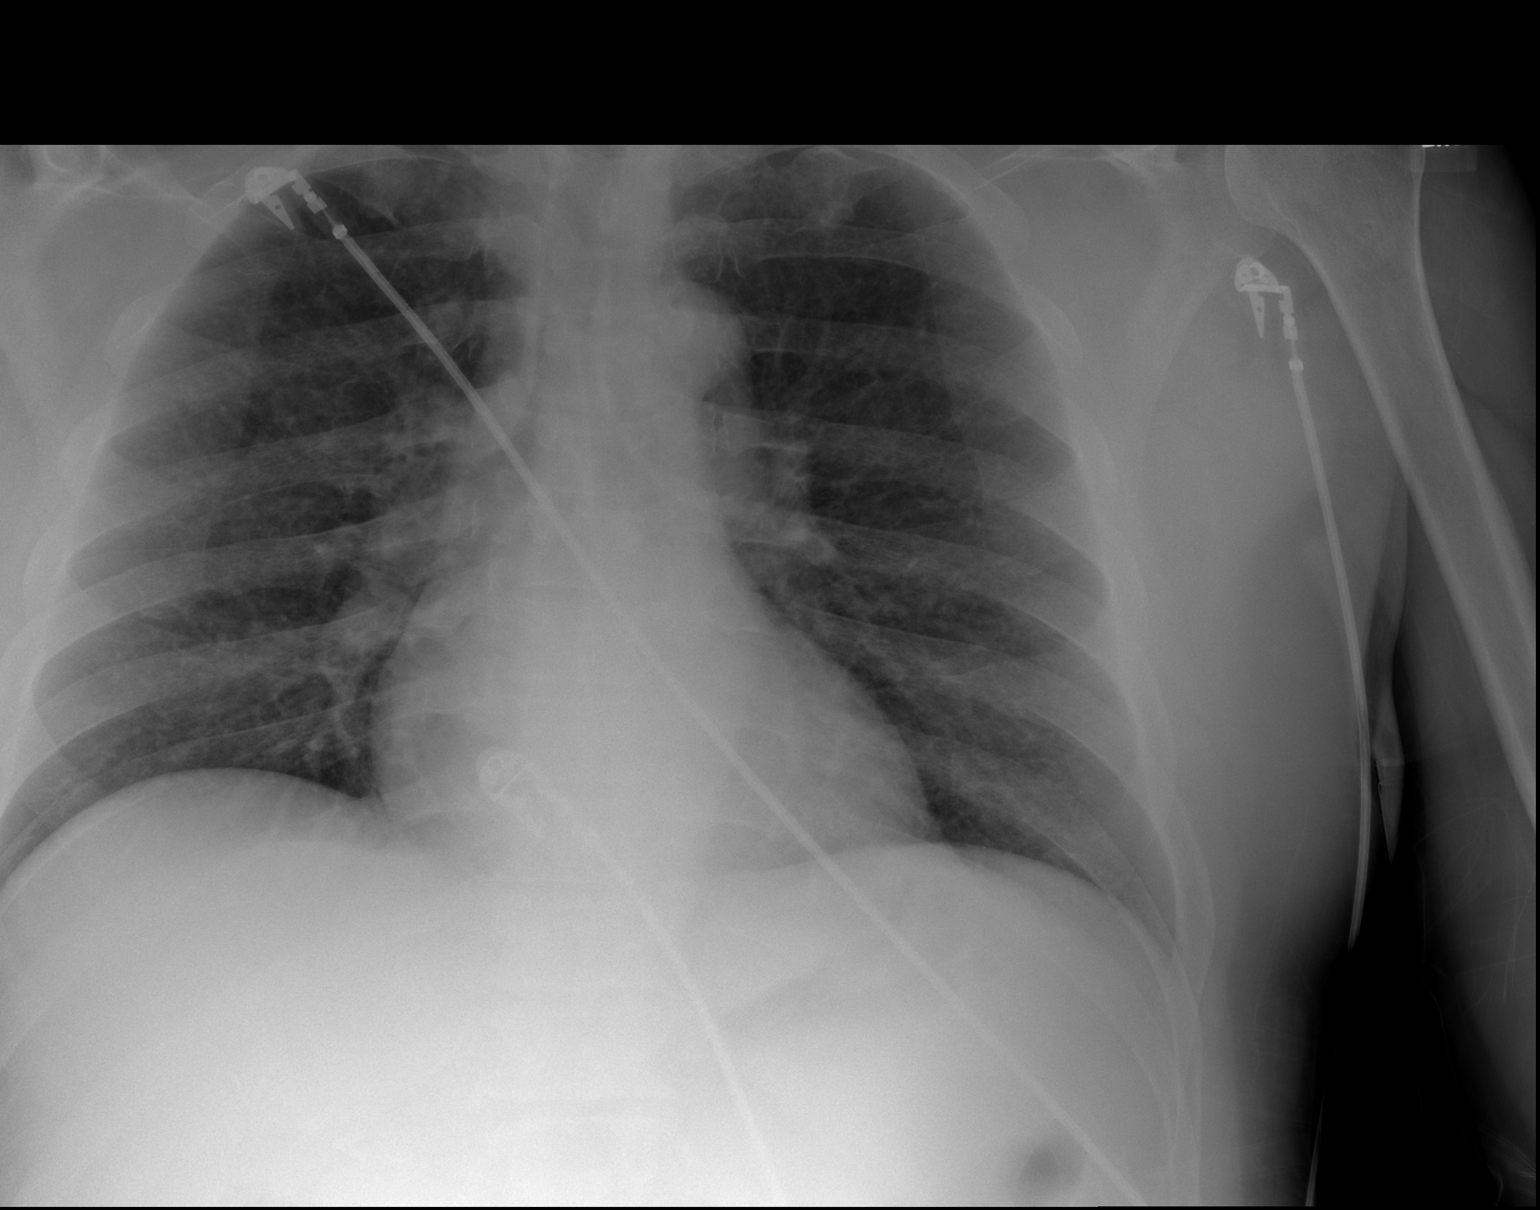
[im 2/2]
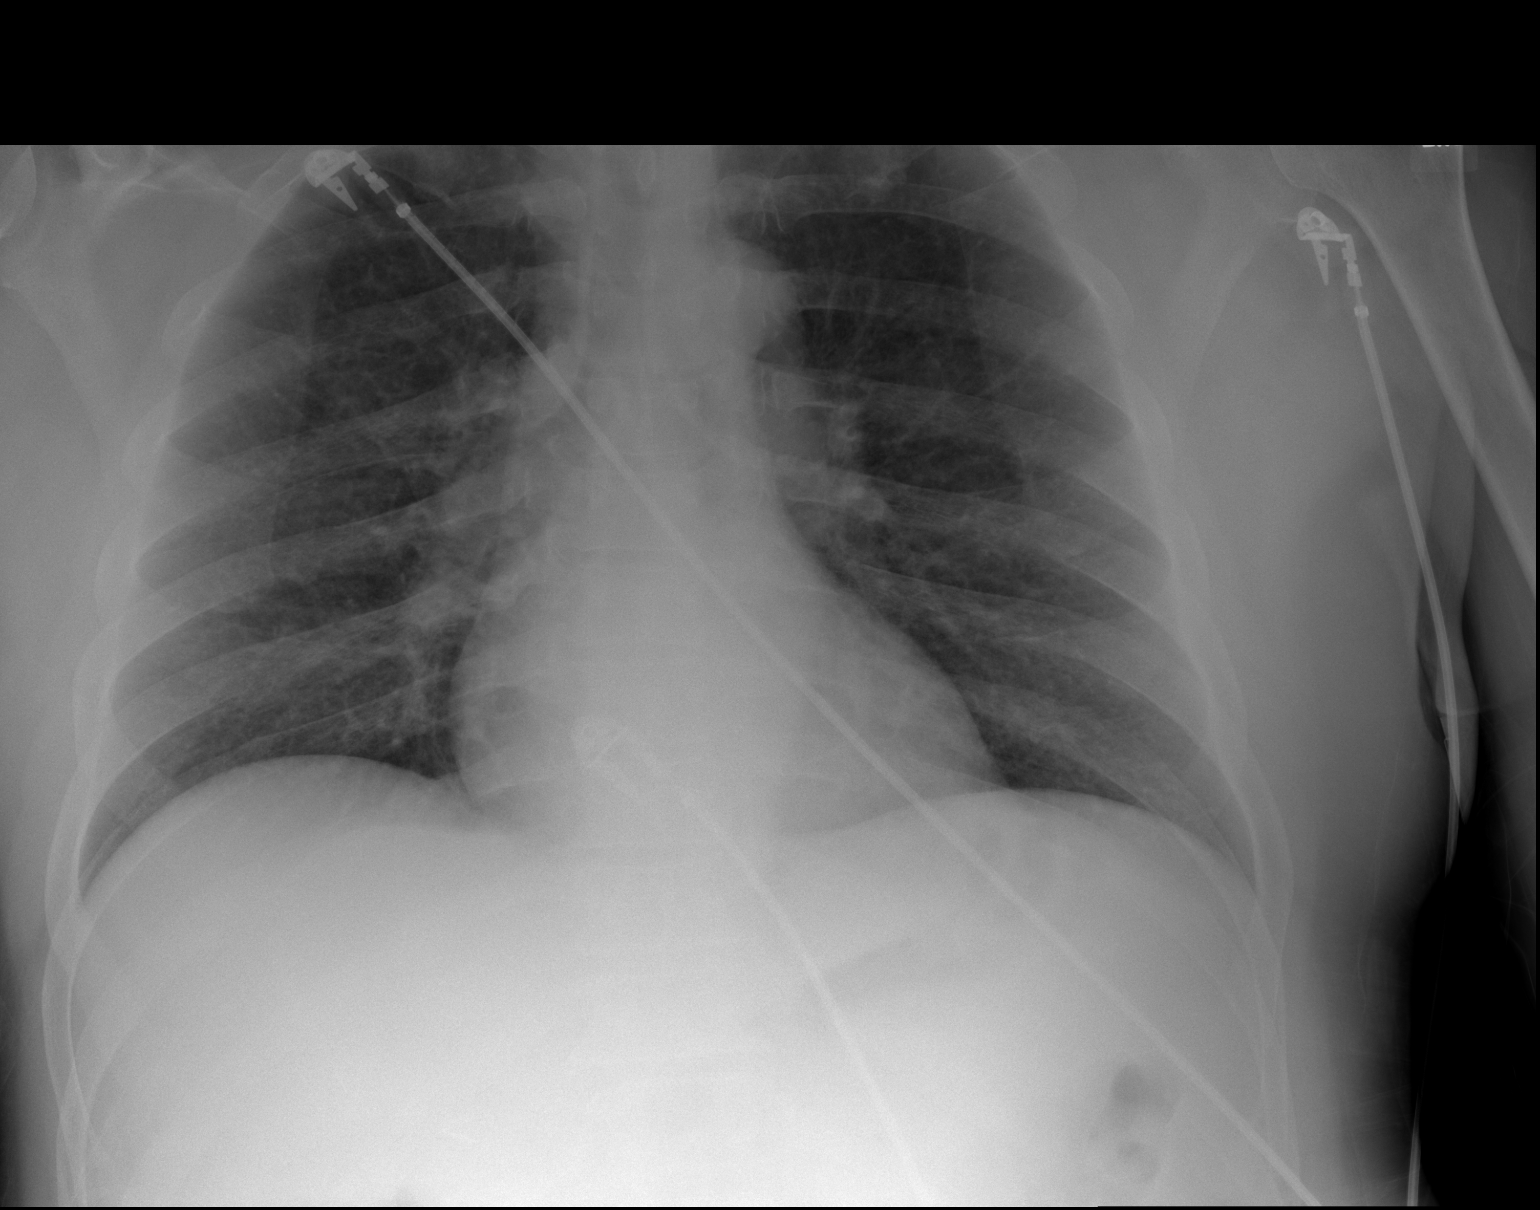

[2 of 2 positions shown; findings below may reference images not displayed]

FINDINGS: The heart and mediastinum are stable. Mild interstitial prominence is likely
technical. No focal pulmonary opacities.
IMPRESSION: No acute cardiopulmonary disease.

## 2014-01-01 IMAGING — CT CT ANGIO CHEST
1 series · 20 of 32 positions shown · IV contrast (APPLIED)
Comparison: none

REASON FOR EXAM: evaluate for dissection
COMMENTS:

PROCEDURE:     CT  - CT ANGIOGRAPHY CHEST W/CONTRAST  - March 06, 2013  [DATE]
RESULT:     Comparison: None
TECHNIQUE: Multiple axial images of the chest were obtained with 100 mL
[id number]-[id number] intravenous contrast according to the dissection protocol.

[Series 4: soft tissue · axial · 0.83mm/px · z∈[-476,-143]mm · 20 of 121 slices shown]
[im 5/121  lung]
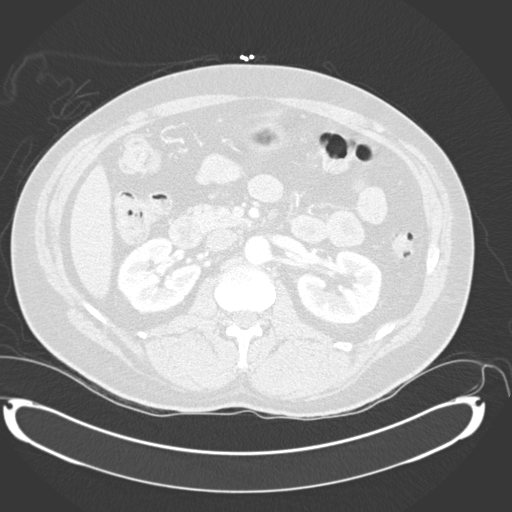
[im 14/121  mediastinal]
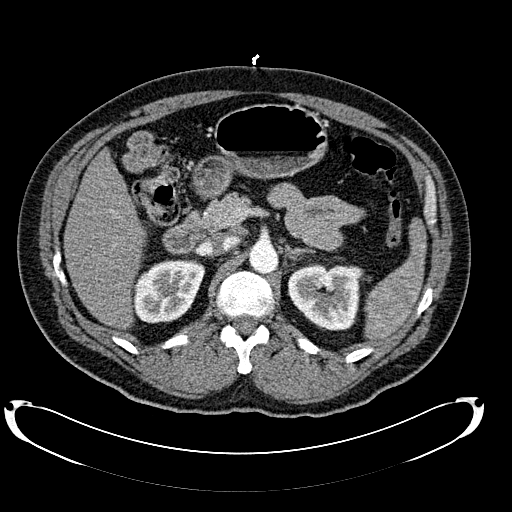
[im 18/121  lung]
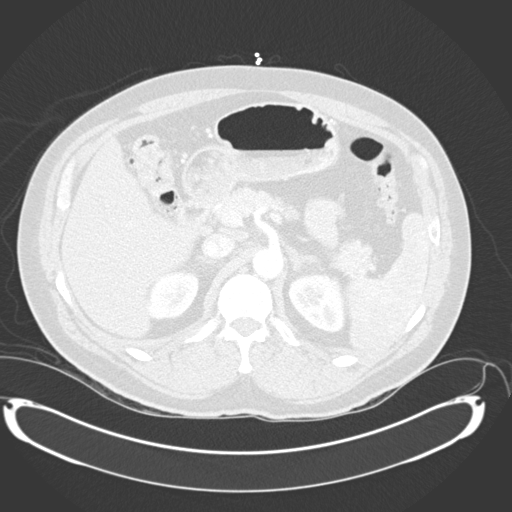
[im 25/121  mediastinal]
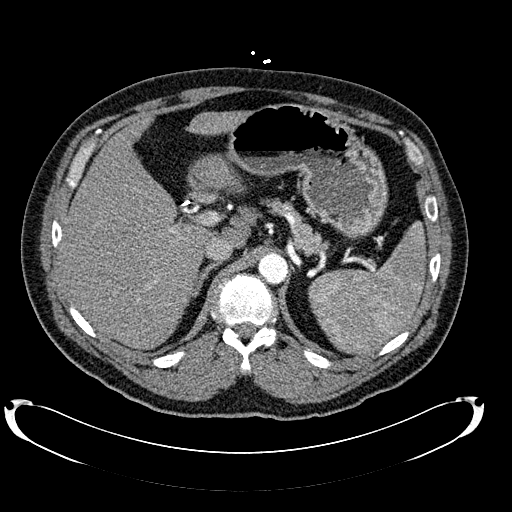
[im 27/121  lung]
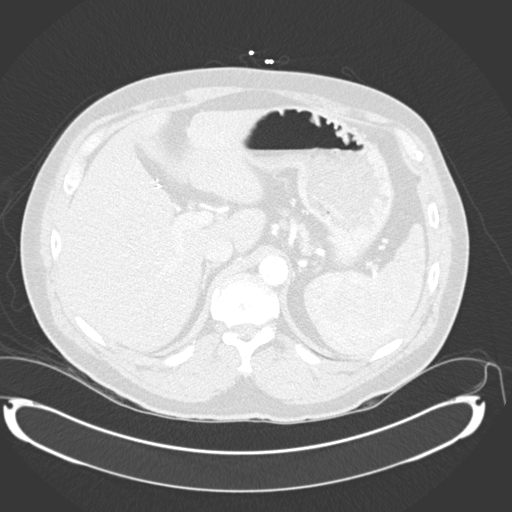
[im 36/121  mediastinal]
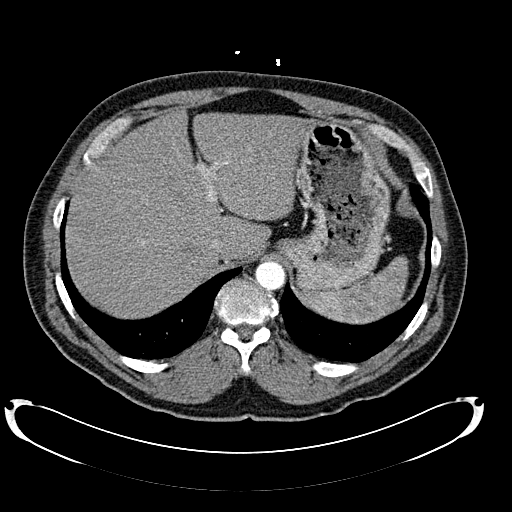
[im 41/121  lung]
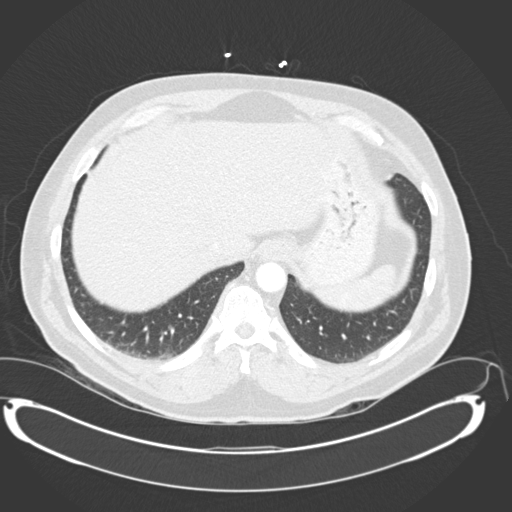
[im 49/121  mediastinal]
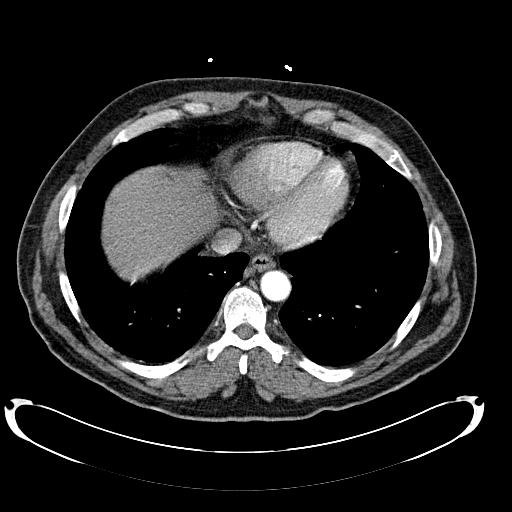
[im 54/121  lung]
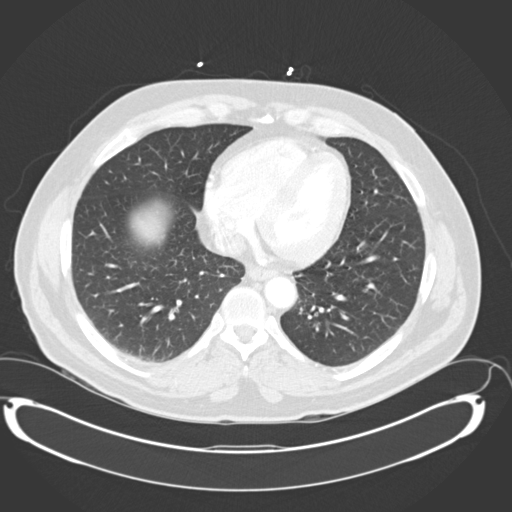
[im 63/121  mediastinal]
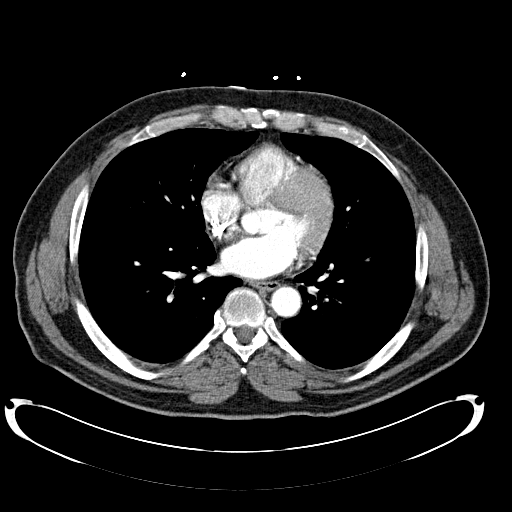
[im 64/121  lung]
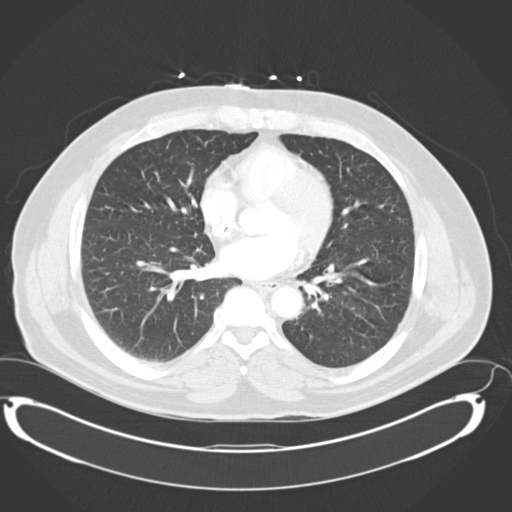
[im 72/121  mediastinal]
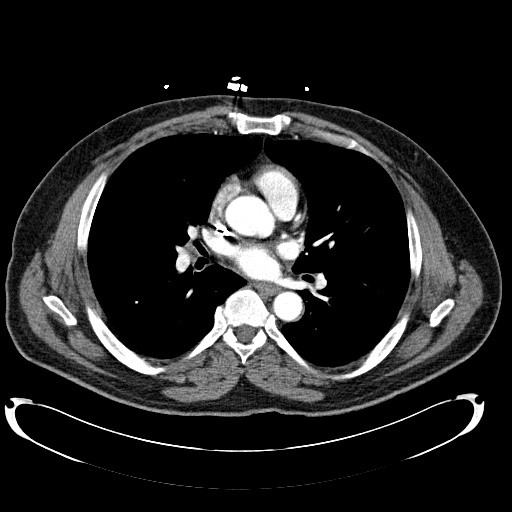
[im 73/121  lung]
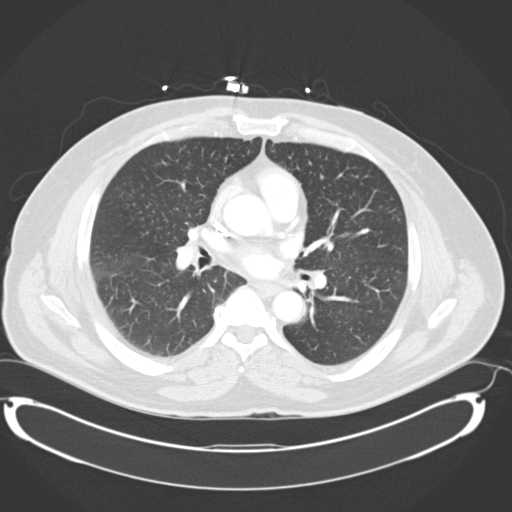
[im 81/121  mediastinal]
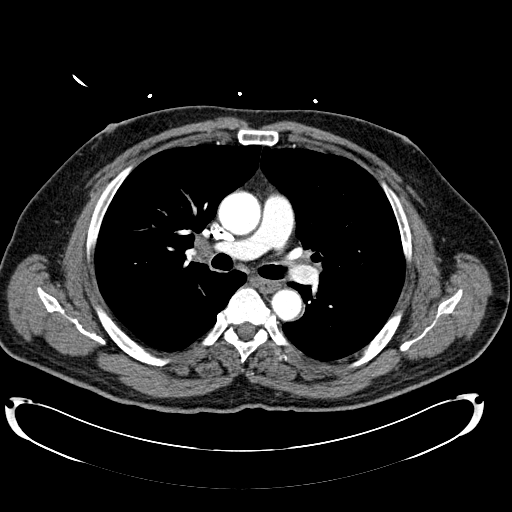
[im 85/121  lung]
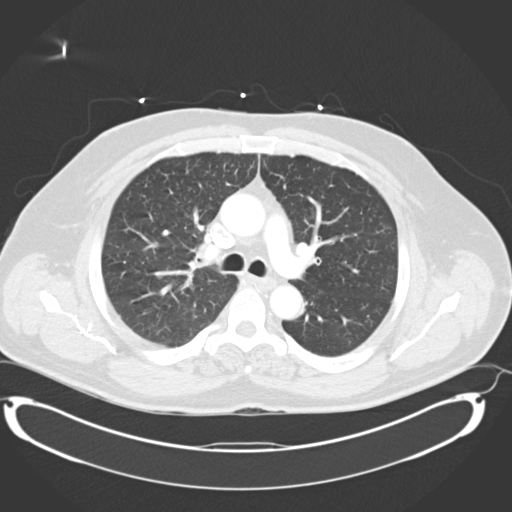
[im 94/121  mediastinal]
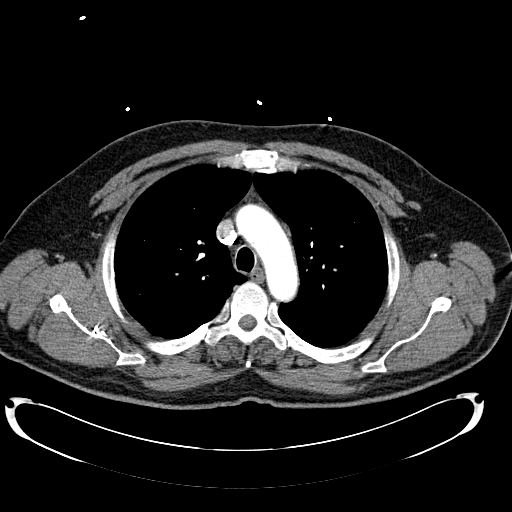
[im 97/121  lung]
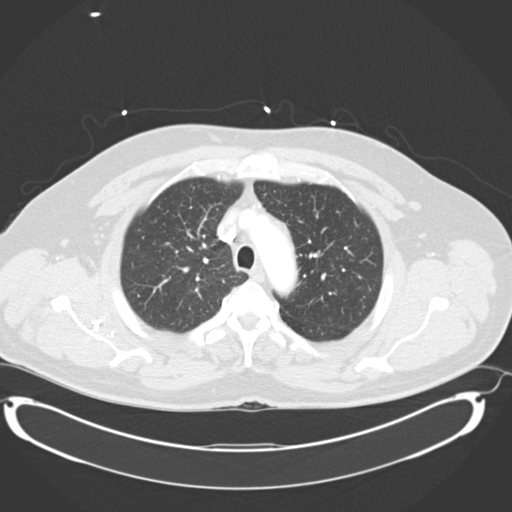
[im 103/121  mediastinal]
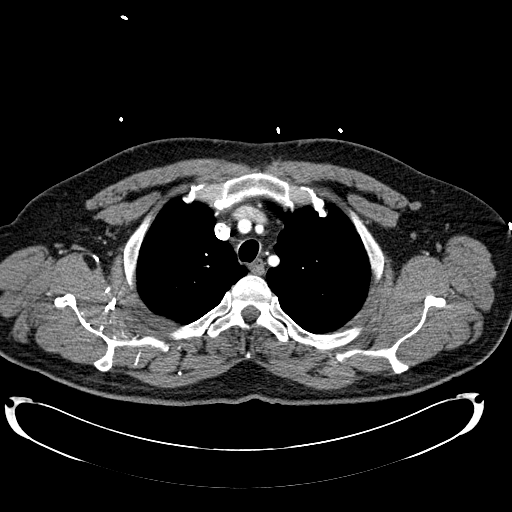
[im 107/121  lung]
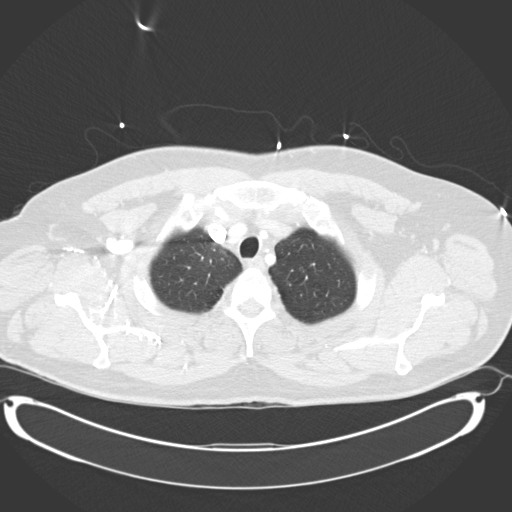
[im 116/121  mediastinal]
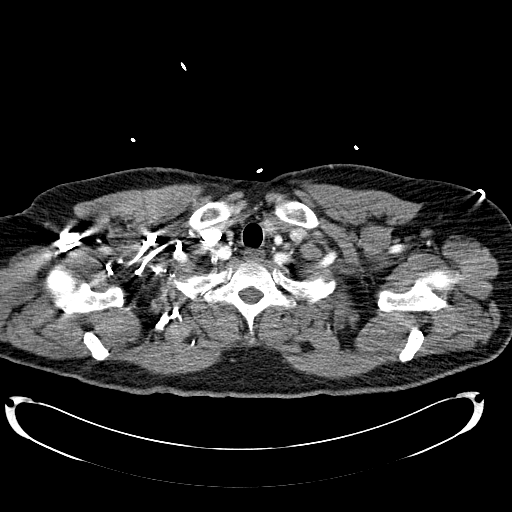

[20 of 32 positions shown; findings below may reference images not displayed]

FINDINGS: No mediastinal, hilar, or axillary lymphadenopathy. Surgical clips are seen
from prior cholecystectomy.

The thoracic aorta is normal in caliber. No evidence of aortic dissection.
No central pulmonary embolus.

Minimal subpleural opacities are likely secondary to atelectasis. The
central airways are patent.

No aggressive lytic or sclerotic osseous lesions are identified.
IMPRESSION: No evidence of aortic dissection.

## 2014-02-04 ENCOUNTER — Emergency Department: Payer: Self-pay | Admitting: Emergency Medicine

## 2014-02-04 LAB — ETHANOL
Ethanol %: 0.07 % (ref 0.000–0.080)
Ethanol: 70 mg/dL

## 2014-02-04 LAB — COMPREHENSIVE METABOLIC PANEL
Albumin: 3.9 g/dL (ref 3.4–5.0)
Alkaline Phosphatase: 96 U/L
Anion Gap: 6 — ABNORMAL LOW (ref 7–16)
BUN: 6 mg/dL — ABNORMAL LOW (ref 7–18)
Bilirubin,Total: 0.3 mg/dL (ref 0.2–1.0)
Calcium, Total: 8.9 mg/dL (ref 8.5–10.1)
Chloride: 107 mmol/L (ref 98–107)
Co2: 28 mmol/L (ref 21–32)
Creatinine: 0.79 mg/dL (ref 0.60–1.30)
EGFR (African American): >60
EGFR (Non-African Amer.): >60
Glucose: 84 mg/dL (ref 65–99)
Osmolality: 278 (ref 275–301)
Potassium: 5 mmol/L (ref 3.5–5.1)
SGOT(AST): 36 U/L (ref 15–37)
SGPT (ALT): 45 U/L (ref 12–78)
Sodium: 141 mmol/L (ref 136–145)
Total Protein: 7.6 g/dL (ref 6.4–8.2)

## 2014-02-04 LAB — APTT: ACTIVATED PTT: 23.7 s (ref 23.6–35.9)

## 2014-02-04 LAB — CBC
HCT: 55.5 % — ABNORMAL HIGH (ref 40.0–52.0)
HGB: 18.4 g/dL — ABNORMAL HIGH (ref 13.0–18.0)
MCH: 29.1 pg (ref 26.0–34.0)
MCHC: 33.1 g/dL (ref 32.0–36.0)
MCV: 88 fL (ref 80–100)
Platelet: 205 10*3/uL (ref 150–440)
RBC: 6.31 10*6/uL — ABNORMAL HIGH (ref 4.40–5.90)
RDW: 12.6 % (ref 11.5–14.5)
WBC: 11 10*3/uL — ABNORMAL HIGH (ref 3.8–10.6)

## 2014-02-04 LAB — PROTIME-INR
INR: 1
PROTHROMBIN TIME: 12.9 s (ref 11.5–14.7)

## 2014-02-04 LAB — TROPONIN I
Troponin-I: <0.02 ng/mL
Troponin-I: <0.02 ng/mL

## 2014-02-04 LAB — SALICYLATE LEVEL: SALICYLATES, SERUM: 2.4 mg/dL

## 2014-02-04 LAB — DRUG SCREEN, URINE
Amphetamines, Ur Screen: NEGATIVE (ref ?–1000)
BARBITURATES, UR SCREEN: NEGATIVE (ref ?–200)
Benzodiazepine, Ur Scrn: POSITIVE (ref ?–200)
CANNABINOID 50 NG, UR ~~LOC~~: NEGATIVE (ref ?–50)
COCAINE METABOLITE, UR ~~LOC~~: POSITIVE (ref ?–300)
MDMA (Ecstasy)Ur Screen: NEGATIVE (ref ?–500)
Methadone, Ur Screen: NEGATIVE (ref ?–300)
OPIATE, UR SCREEN: NEGATIVE (ref ?–300)
Phencyclidine (PCP) Ur S: NEGATIVE (ref ?–25)
TRICYCLIC, UR SCREEN: POSITIVE (ref ?–1000)

## 2014-02-04 LAB — ACETAMINOPHEN LEVEL: Acetaminophen: <2 ug/mL

## 2014-02-04 LAB — CK TOTAL AND CKMB (NOT AT ARMC)
CK, Total: 80 U/L
CK-MB: 0.9 ng/mL (ref 0.5–3.6)

## 2014-02-04 LAB — TSH: Thyroid Stimulating Horm: 1.2 u[IU]/mL

## 2014-02-05 ENCOUNTER — Inpatient Hospital Stay: Payer: Self-pay | Admitting: Psychiatry

## 2014-05-28 IMAGING — CT CT HEAD WITHOUT CONTRAST
1 series · 16 of 30 positions shown, 20 images · non-contrast
Comparison: none

REASON FOR EXAM: l side weakness
COMMENTS:

PROCEDURE:     CT  - CT HEAD WITHOUT CONTRAST  - August 01, 2013 [DATE]
RESULT:     Comparison:  03/05/2013
TECHNIQUE: Multiple axial images from the foramen magnum to the vertex were
obtained without IV contrast.

[Series 2: soft tissue · axial · 0.42mm/px · z∈[-82,+58]mm · 16 of 32 slices shown, 20 images]
[im 2/32  brain]
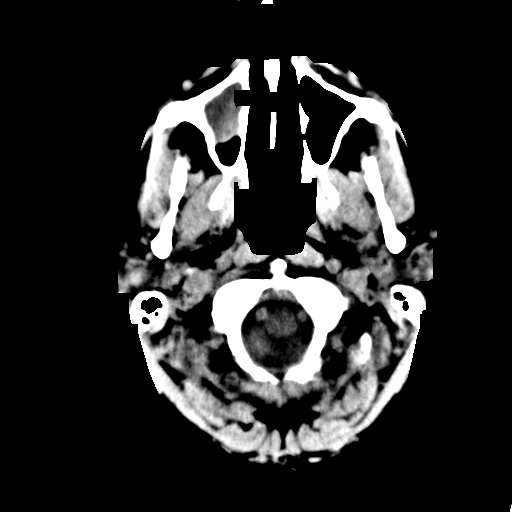
[im 2/32  bone]
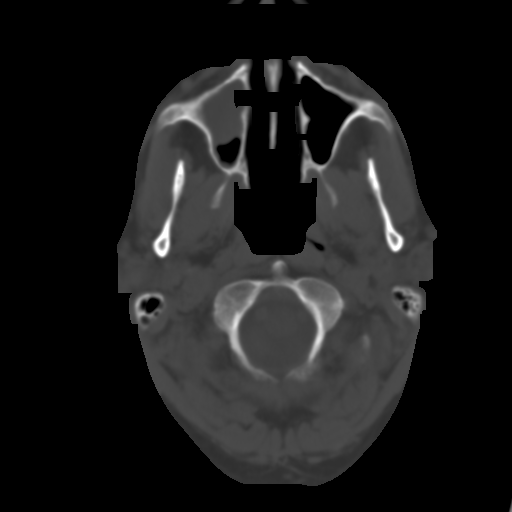
[im 4/32  brain]
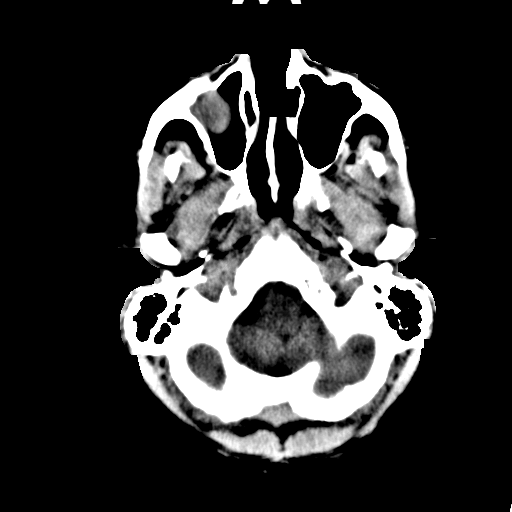
[im 6/32  brain]
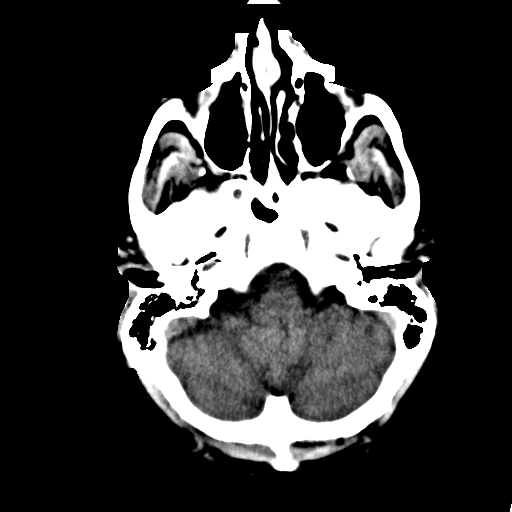
[im 8/32  brain]
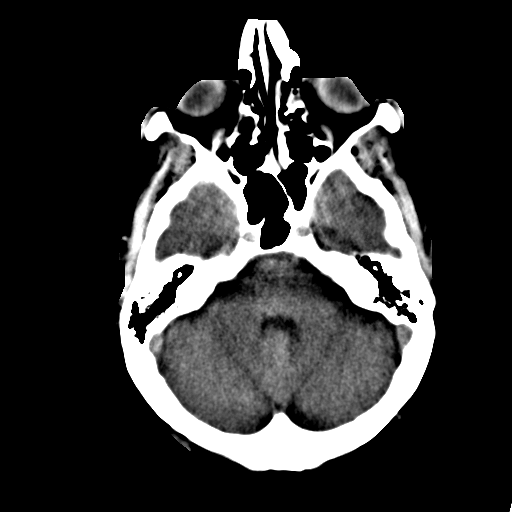
[im 9/32  brain]
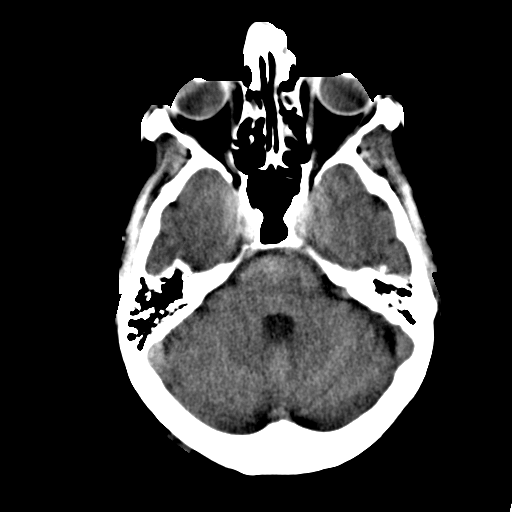
[im 9/32  bone]
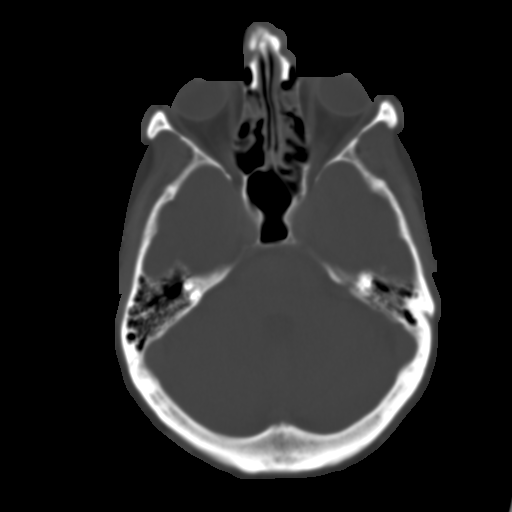
[im 11/32  brain]
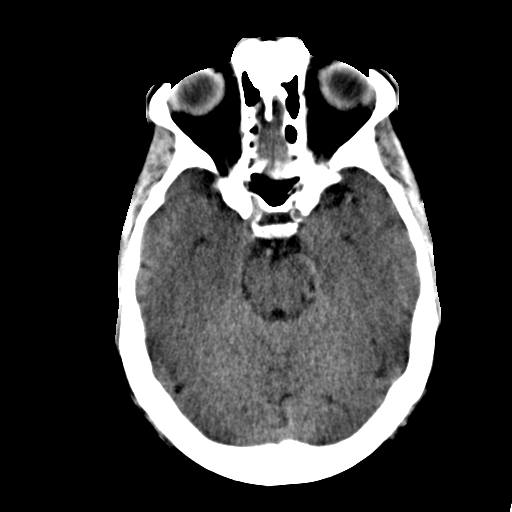
[im 13/32  brain]
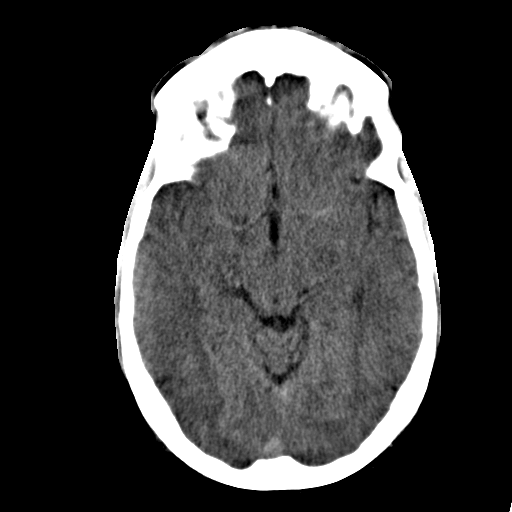
[im 15/32  brain]
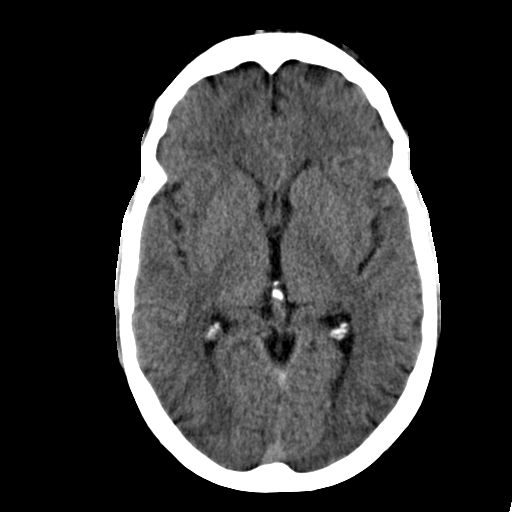
[im 17/32  brain]
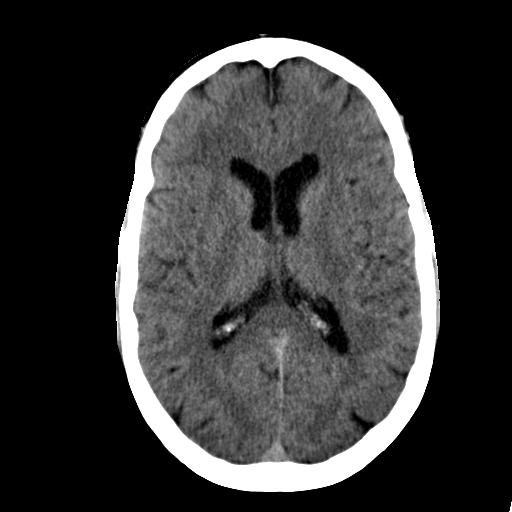
[im 17/32  bone]
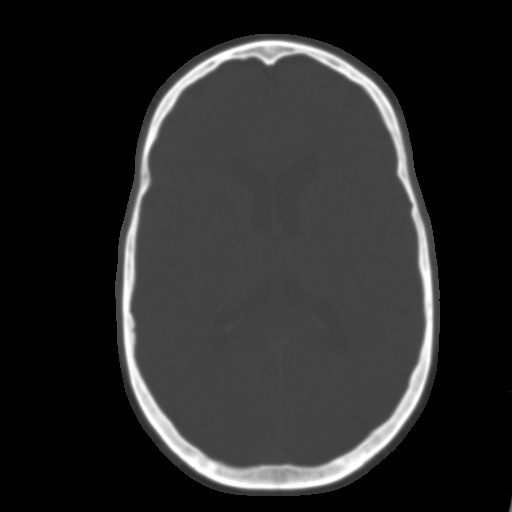
[im 19/32  brain]
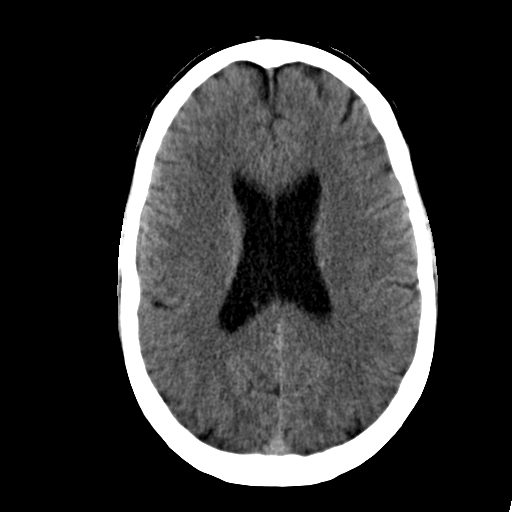
[im 21/32  brain]
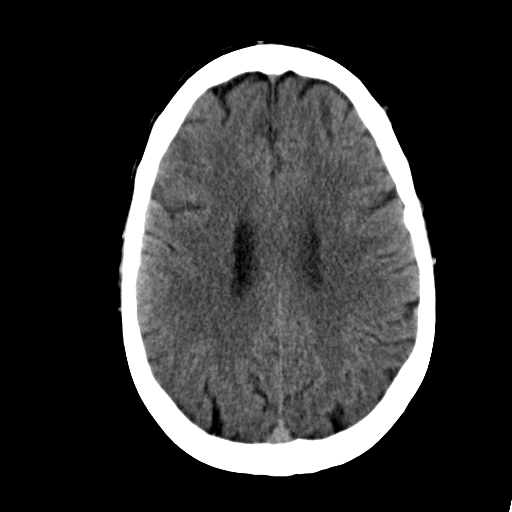
[im 23/32  brain]
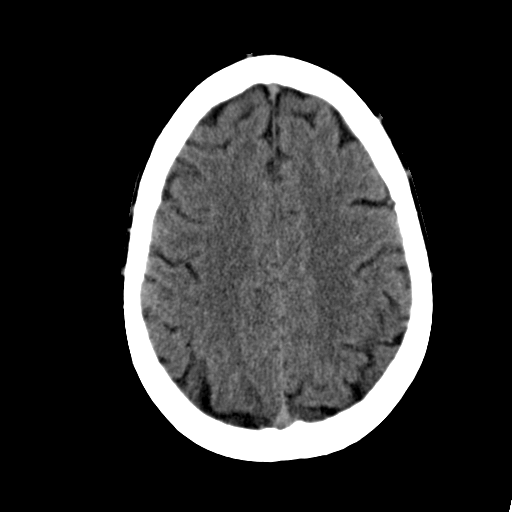
[im 24/32  brain]
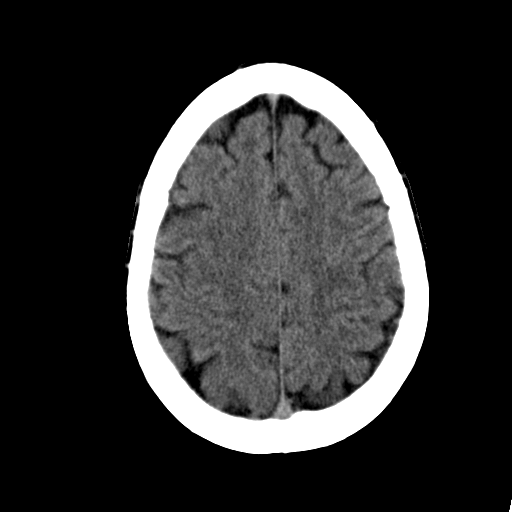
[im 24/32  bone]
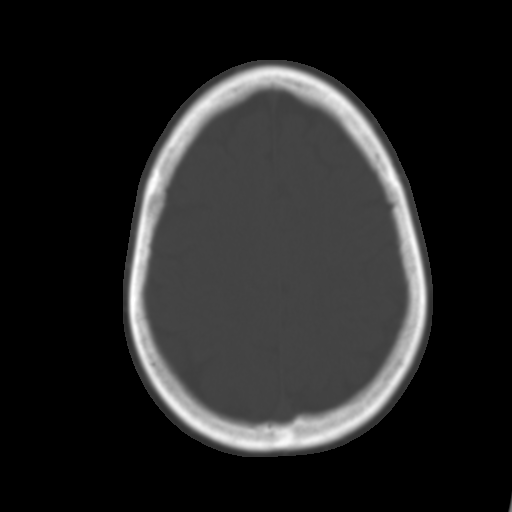
[im 26/32  brain]
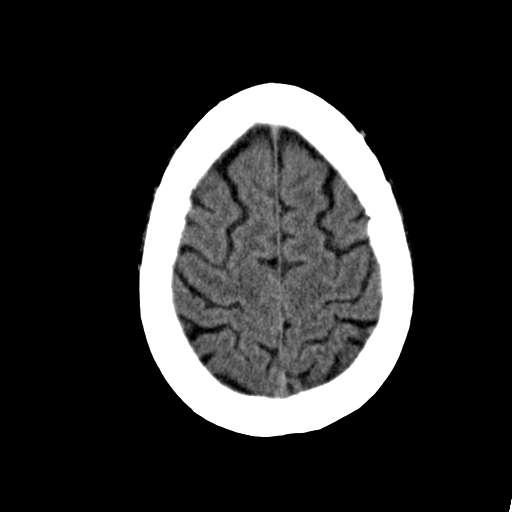
[im 28/32  brain]
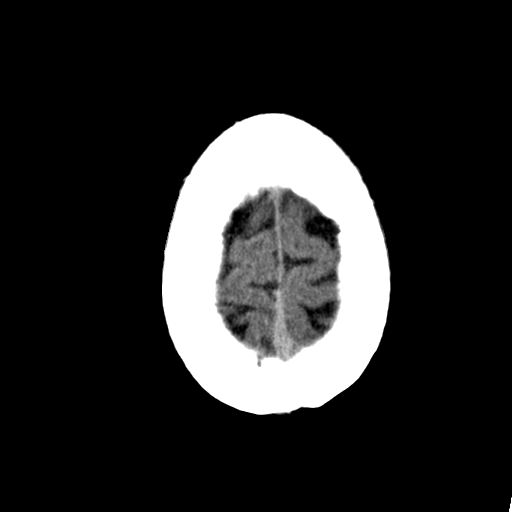
[im 30/32  brain]
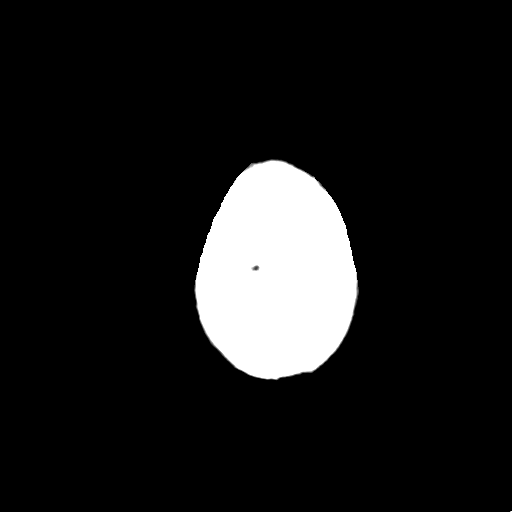

[16 of 30 positions shown; findings below may reference images not displayed]

FINDINGS: There is no evidence of mass effect, midline shift, or extra-axial fluid
collections.  There is no evidence of a space-occupying lesion or
intracranial hemorrhage. There is no evidence of a cortical-based area of
acute infarction.

The ventricles and sulci are appropriate for the patient's age. The basal
cisterns are patent.

Visualized portions of the orbits are unremarkable. There is a right
maxillary sinus because retention cyst. There is bilateral ethmoid sinus
mucosal thickening.

The osseous structures are unremarkable.
IMPRESSION: No acute intracranial process.

[REDACTED]

## 2014-05-28 IMAGING — CR DG CHEST 1V PORT
1 series · 1 of 1 positions shown · non-contrast
Comparison: none

REASON FOR EXAM: cp
COMMENTS:

[ap]
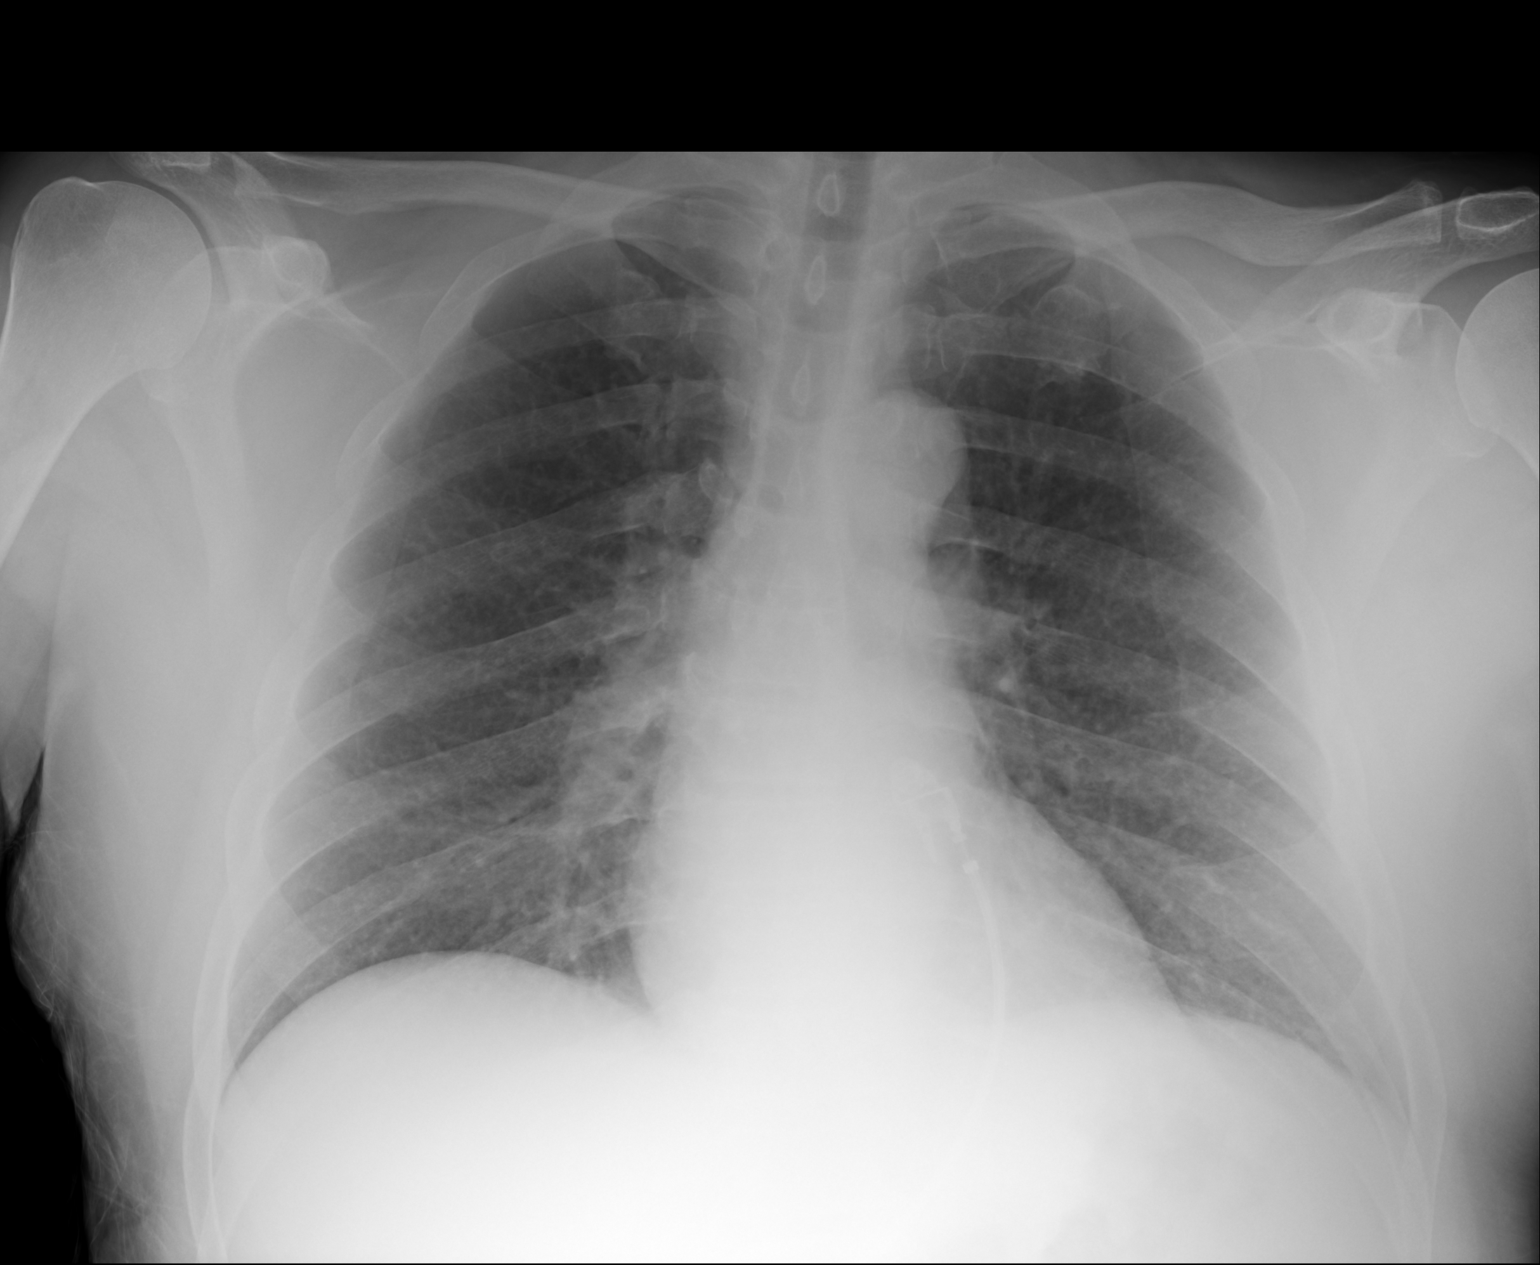

[1 of 1 positions shown; findings below may reference images not displayed]

PROCEDURE:     DXR - DXR PORTABLE CHEST SINGLE VIEW  - July 31, 2013 [DATE]

RESULT:     Comparison is made to study of 03/05/2013.

The lungs are clear. The heart and pulmonary vessels are normal. The bony
and mediastinal structures are unremarkable. There is no effusion. There is
no pneumothorax or evidence of congestive failure.
IMPRESSION: No acute cardiopulmonary disease. Stable appearance.

[REDACTED]

## 2014-05-28 IMAGING — CT CT CHEST-ABD-PELV W/ CM
1 of 2 series · 13 of 32 positions shown, 18 images · non-contrast
Comparison: none

REASON FOR EXAM: (1) PLEURITIC CP no po contrast; (2) C/O SEVERE ABD
PAIN. WAS FOUND UNRESPONSIVE
COMMENTS:

[Series 8: abdomen · axial · 0.81mm/px · z∈[-862,-370]mm · 13 of 184 slices shown, 18 images]
[im 10/184  soft-tissue]
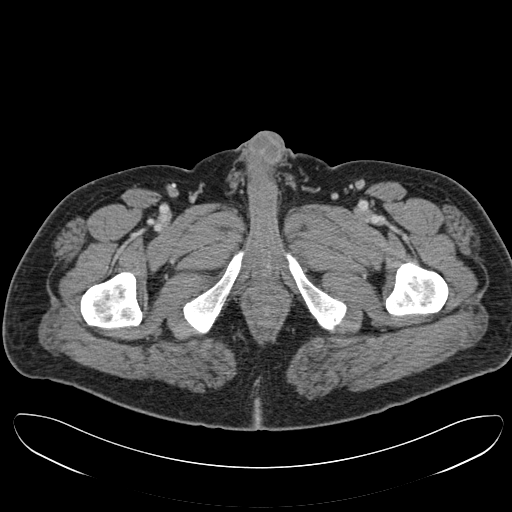
[im 10/184  bone]
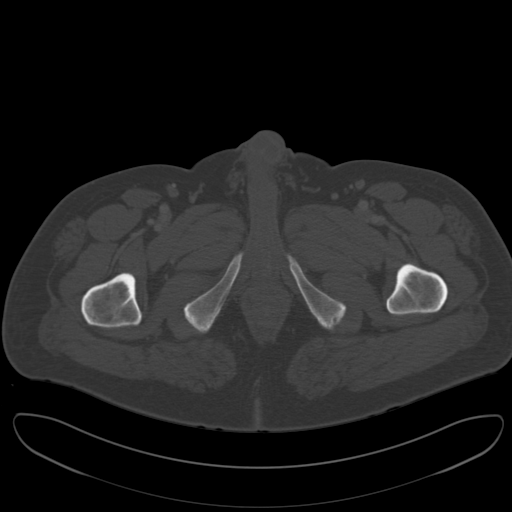
[im 28/184  soft-tissue]
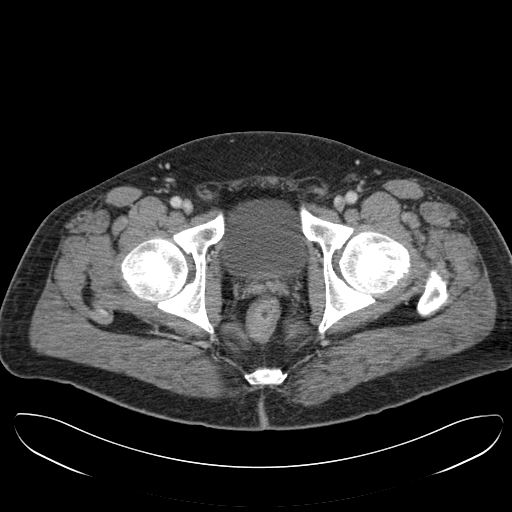
[im 37/184  soft-tissue]
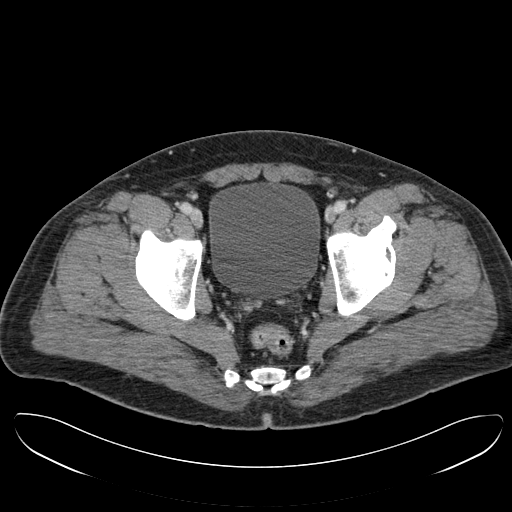
[im 55/184  soft-tissue]
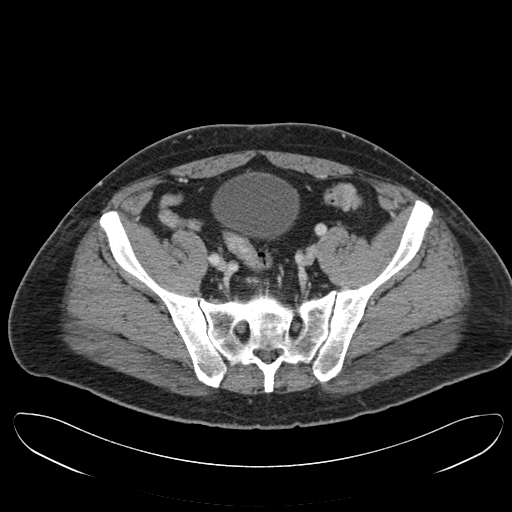
[im 74/184  soft-tissue]
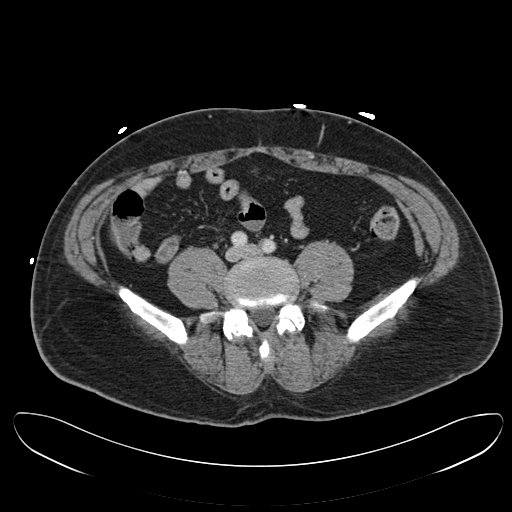
[im 83/184  soft-tissue]
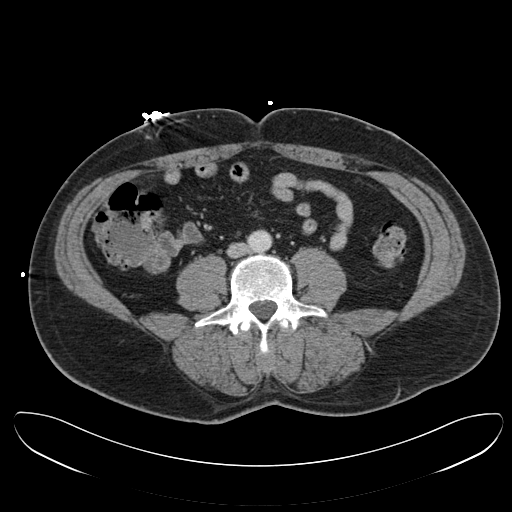
[im 101/184  soft-tissue]
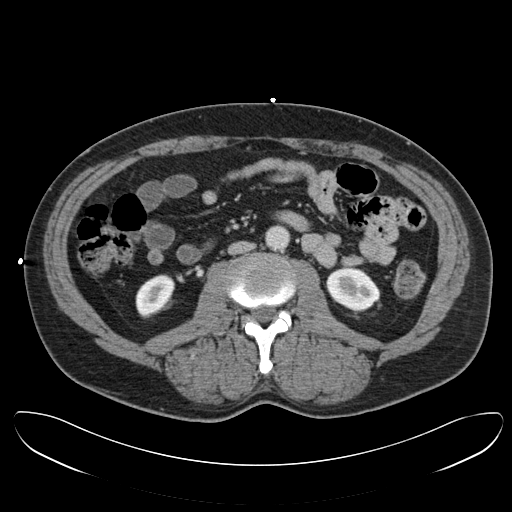
[im 110/184  soft-tissue]
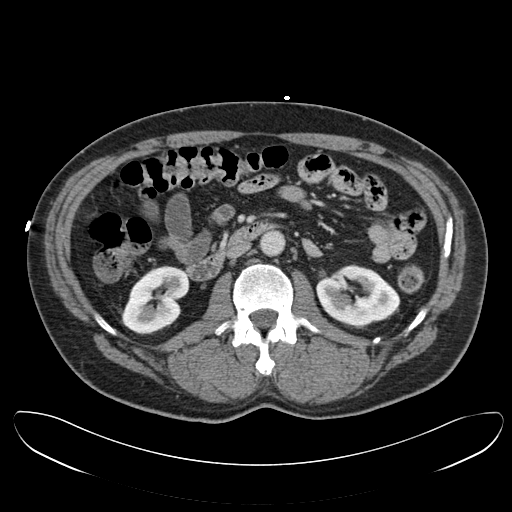
[im 129/184  soft-tissue]
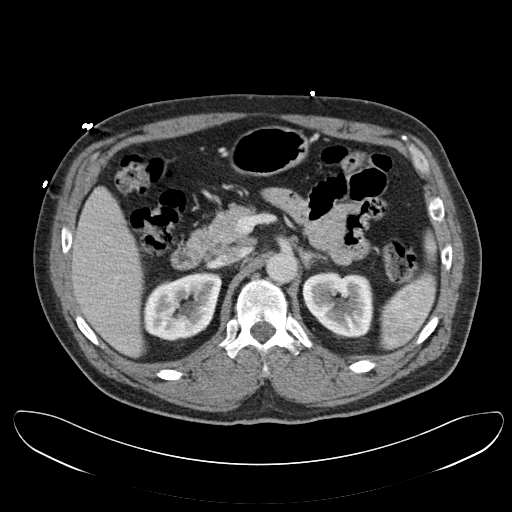
[im 129/184  bone]
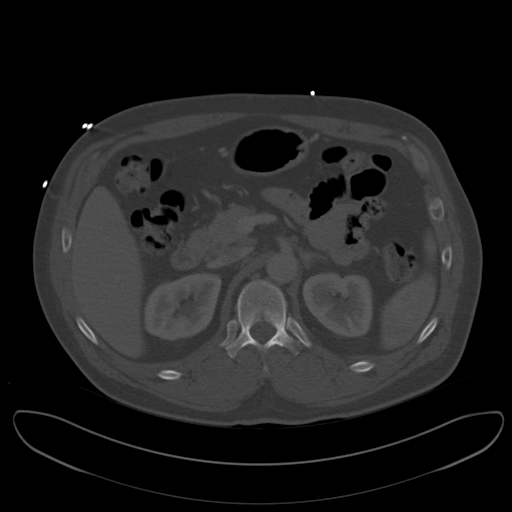
[im 147/184  soft-tissue]
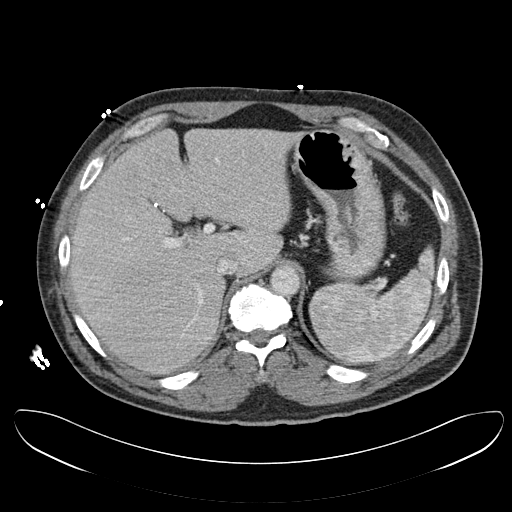
[im 147/184  lung]
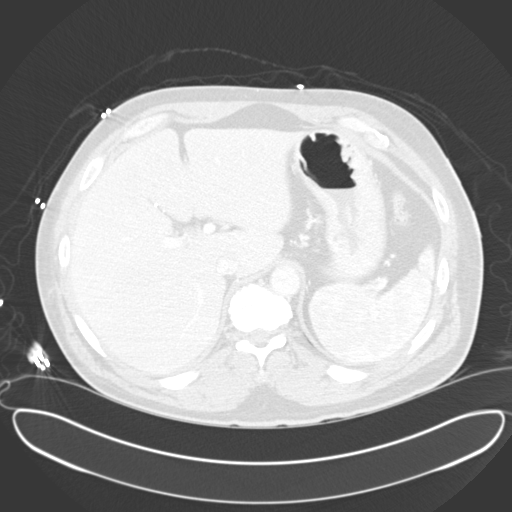
[im 156/184  soft-tissue]
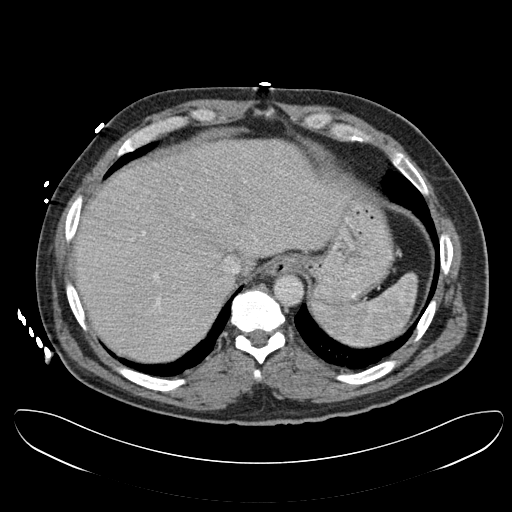
[im 156/184  lung]
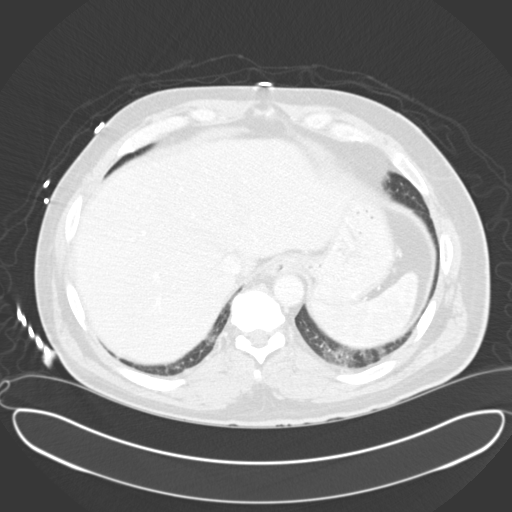
[im 165/184  lung]
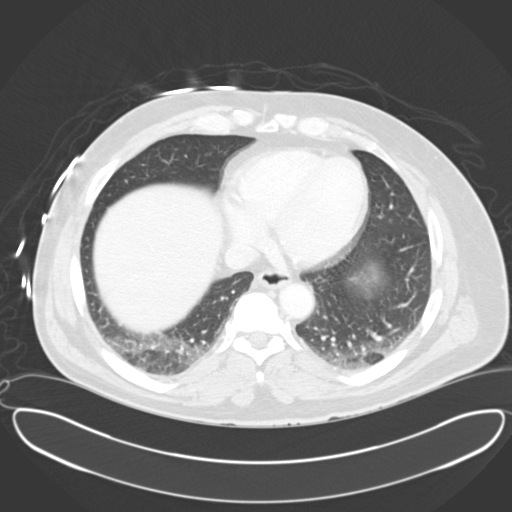
[im 174/184  soft-tissue]
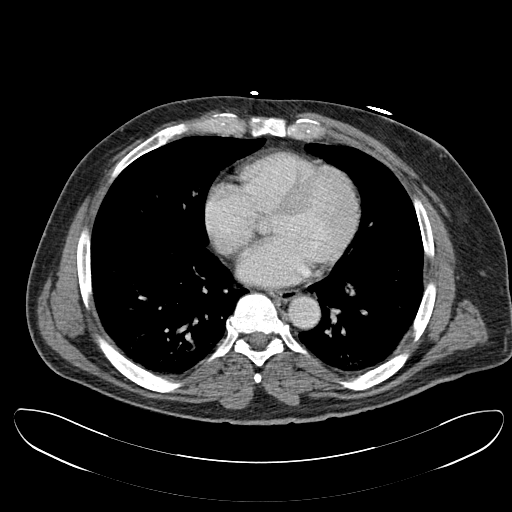
[im 174/184  lung]
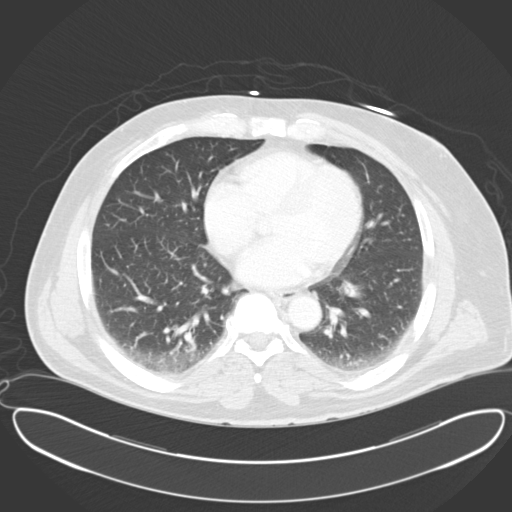

[13 of 32 positions shown; findings below may reference images not displayed]

PROCEDURE:     CT  - CT CHEST ABDOMEN AND PELVIS W  - August 01, 2013 [DATE]

RESULT:     Axial CT scanning was performed through the chest, abdomen, and
pelvis with reconstructions at 3 mm intervals and slice thicknesses. Review
of multiplanar reconstructed images was performed separately on the VIA
monitor. The patient received 100 cc of Isovue 370 intravenously for this
study.

CT scan of the chest: The cardiac chambers are top normal in size. The
caliber of the thoracic aorta is normal. There is no evidence of a false
lumen. Contrast within the pulmonary arterial tree is normal in appearance.
There are no filling defects to suggest an acute pulmonary embolism. There
is no mediastinal or hilar lymphadenopathy. The thoracic esophagus is not
abnormally distended. There is a small hiatal hernia.

At lung window settings there are emphysematous changes bilaterally. There
is compressive atelectasis in the dependent portion of both lungs. There are
no alveolar infiltrates. No suspicious pulmonary parenchymal masses or
nodules are demonstrated. The thoracic vertebral bodies are preserved in
height. There is no evidence of a depressed sternal fracture. The ribs
exhibit no acute fracture.
CONCLUSION: 1. There is no evidence of an acute pulmonary embolism nor acute thoracic
aortic pathology.
2. There is no evidence of a pneumothorax pneumomediastinum or pleural
effusion. No focal pneumonia is demonstrated. There is mild compressive
atelectasis in the dependent portion of both lungs. There is underlying COPD.
3. The bony thorax exhibits no acute abnormality.

CT scan of the abdomen and pelvis: The liver, spleen, pancreas, partially
distended stomach, adrenal glands, and kidneys are normal in appearance. The
gallbladder is surgically absent. The caliber of the abdominal aorta is
normal. The periaortic and pericaval regions exhibit no lymphadenopathy or
other masses. The unopacified loops of small and large bowel exhibit no
evidence of ileus nor of obstruction. The partially distended urinary
bladder is normal in appearance. There is no evidence of ascites. There is
no inguinal hernia. There is a tiny umbilical hernia containing fat. The
lumbar vertebral bodies are preserved in height. There are degenerative
changes at the L5-S1 disc level. The bony pelvis exhibits no acute
abnormality.
IMPRESSION: 1. Please see the discussion above regarding findings in the thorax.
2. Within the abdomen and pelvis no acute abnormality is demonstrated.

A preliminary report was sent to the [HOSPITAL] the conclusion
of the study.

[REDACTED]

## 2014-06-02 IMAGING — CR DG UGI W/ KUB
1 series · 7 of 10 positions shown · non-contrast
Comparison: none

REASON FOR EXAM: dyspepsia abd pain
COMMENTS:

[Series 1: ap (kub) · 7 of 19 slices shown]
[im 1/19]
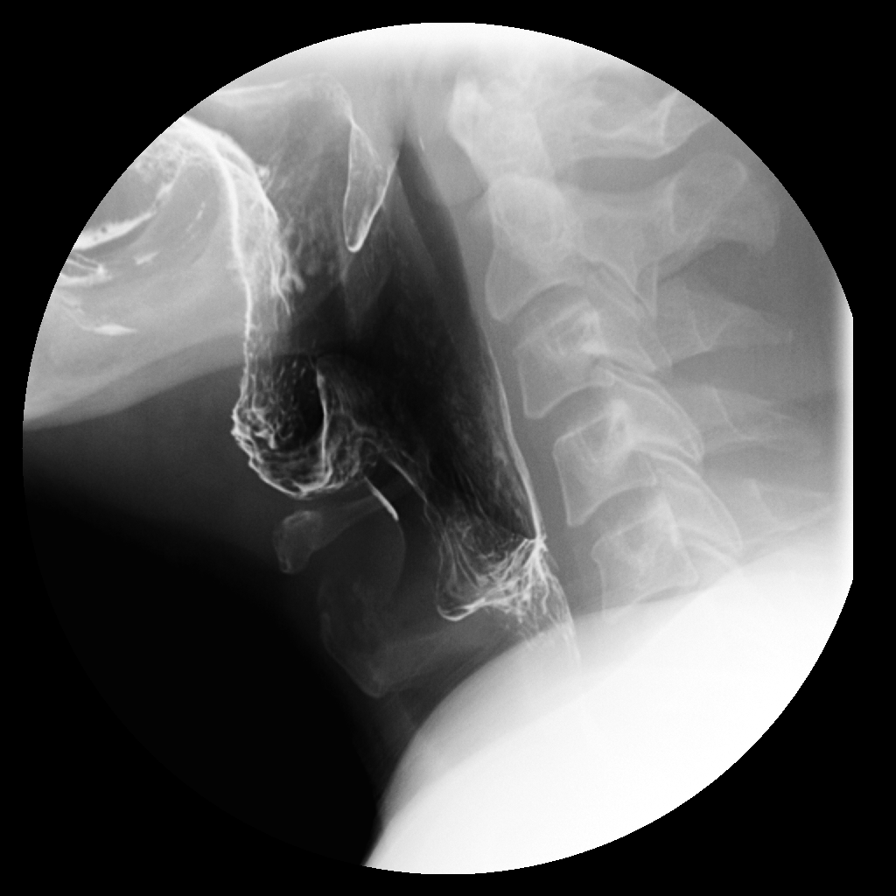
[im 3/19]
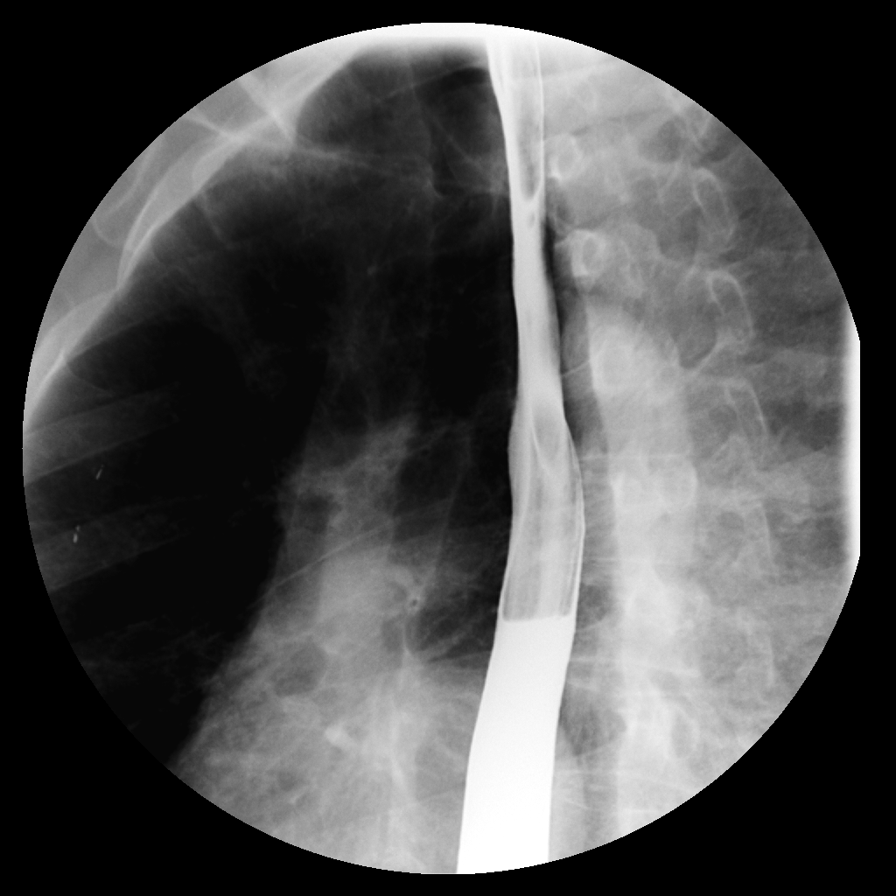
[im 5/19]
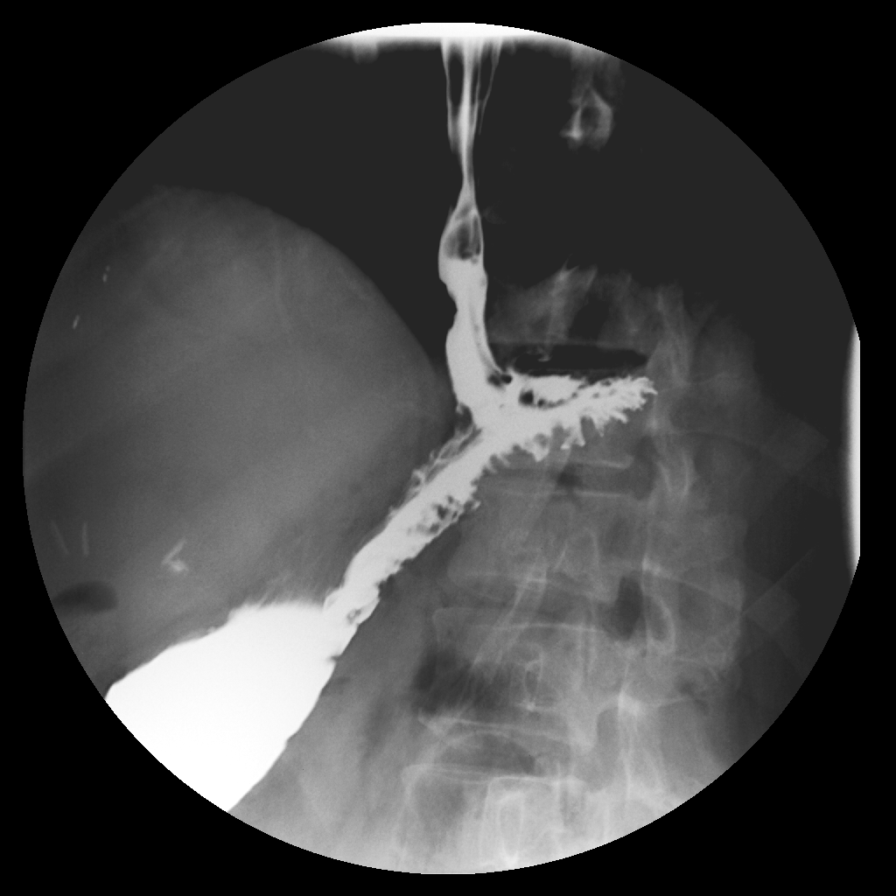
[im 7/19]
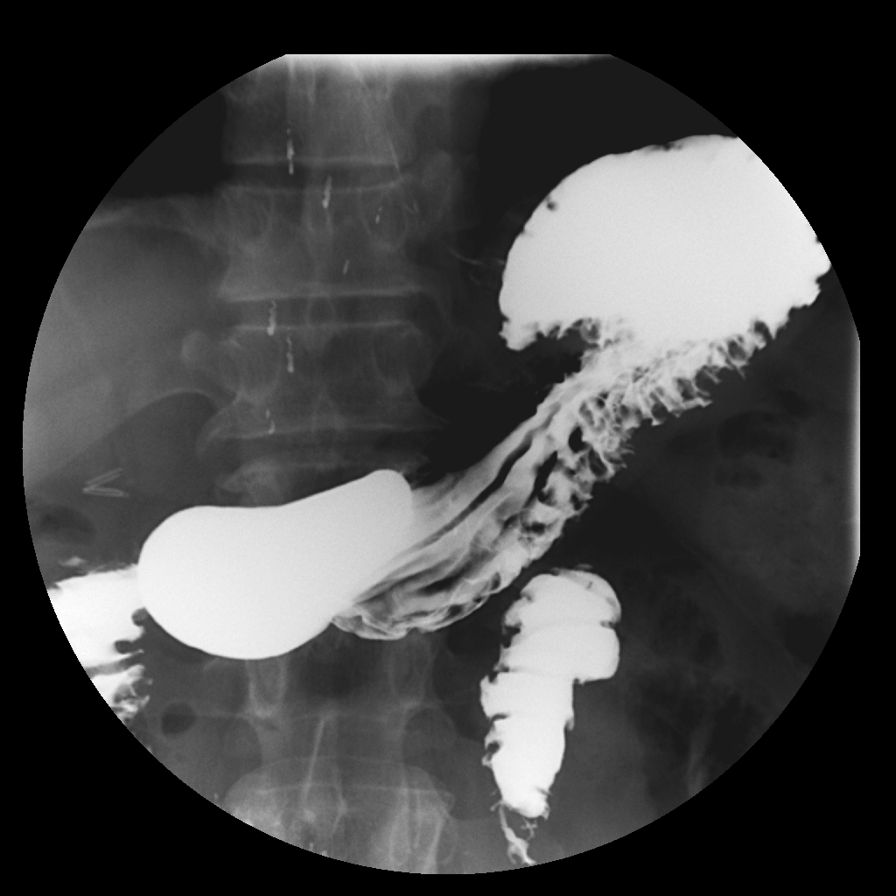
[im 9/19]
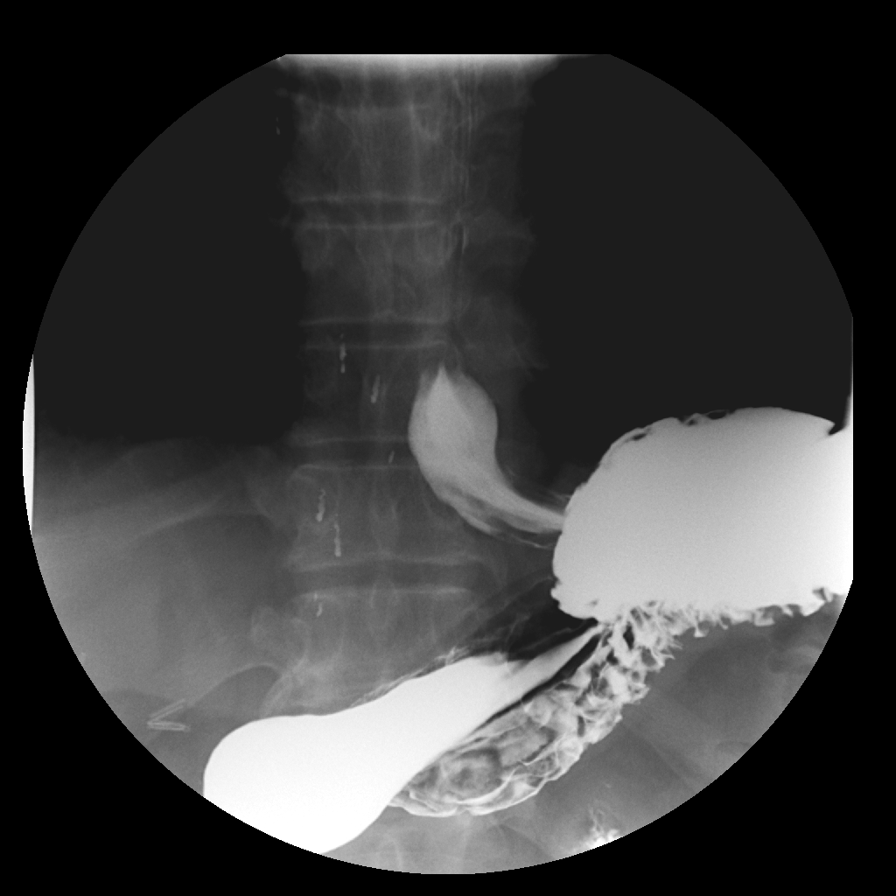
[im 11/19]
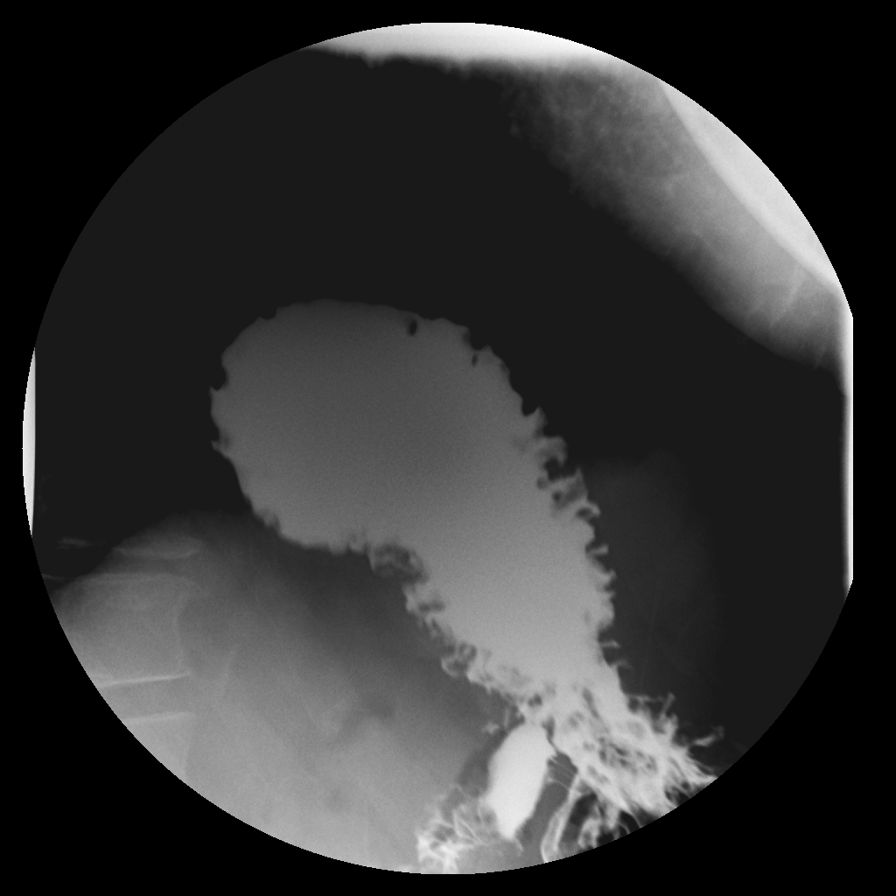
[im 13/19]
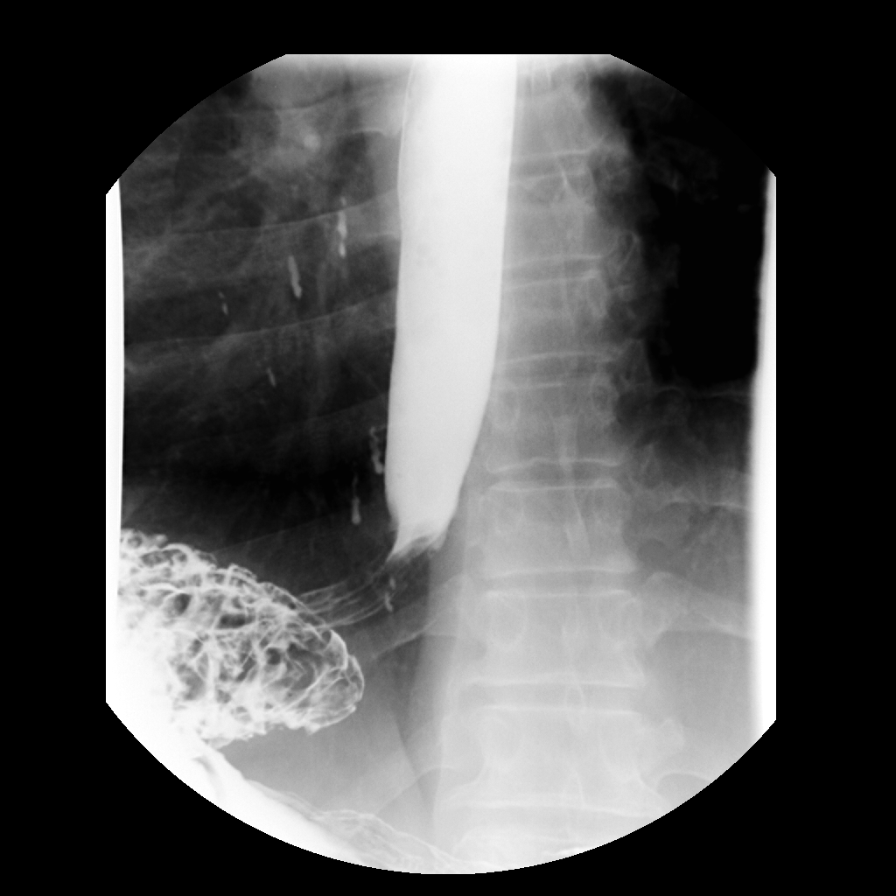

[7 of 10 positions shown; findings below may reference images not displayed]

PROCEDURE:     FL  - FL UPPER GI W/ BARIUM SWALLOW  - August 05, 2013  [DATE]

RESULT:     The anticipated procedure was discussed with Mr. Baughman. He
voiced his willingness to proceed.

The patient ingested thick and thin barium without difficulty. There was no
significant laryngeal penetration of the barium. The thoracic esophagus
distended well. There was mild narrowing of the distal esophagus. The mucosa
there did not appear significantly irregular. The 12 mm barium pill would
not pass here although liquid barium passed without difficulty.

The stomach distended well. Gastric emptying appeared normal. The mucosal
folds were pliable to external compression. The duodenal bulb and C-sweep
were normal in appearance.
IMPRESSION: 1. There is long segment narrowing of the distal 4 cm of the esophagus which
would not allow passage of a 12 mm barium pill. The mucosa did not appear
ulcerated however. More proximally the thoracic esophagus was normal in
appearance.
2. The stomach and duodenum exhibit no acute abnormalities.

Given the patient's history of Barrett's esophagus, direct visualization of
the esophagus and stomach is recommended.

[REDACTED]

## 2014-07-02 IMAGING — CR DG CHEST 1V PORT
1 series · 1 of 1 positions shown · non-contrast
Comparison: none

REASON FOR EXAM: Chest Pain
COMMENTS:

[ap]
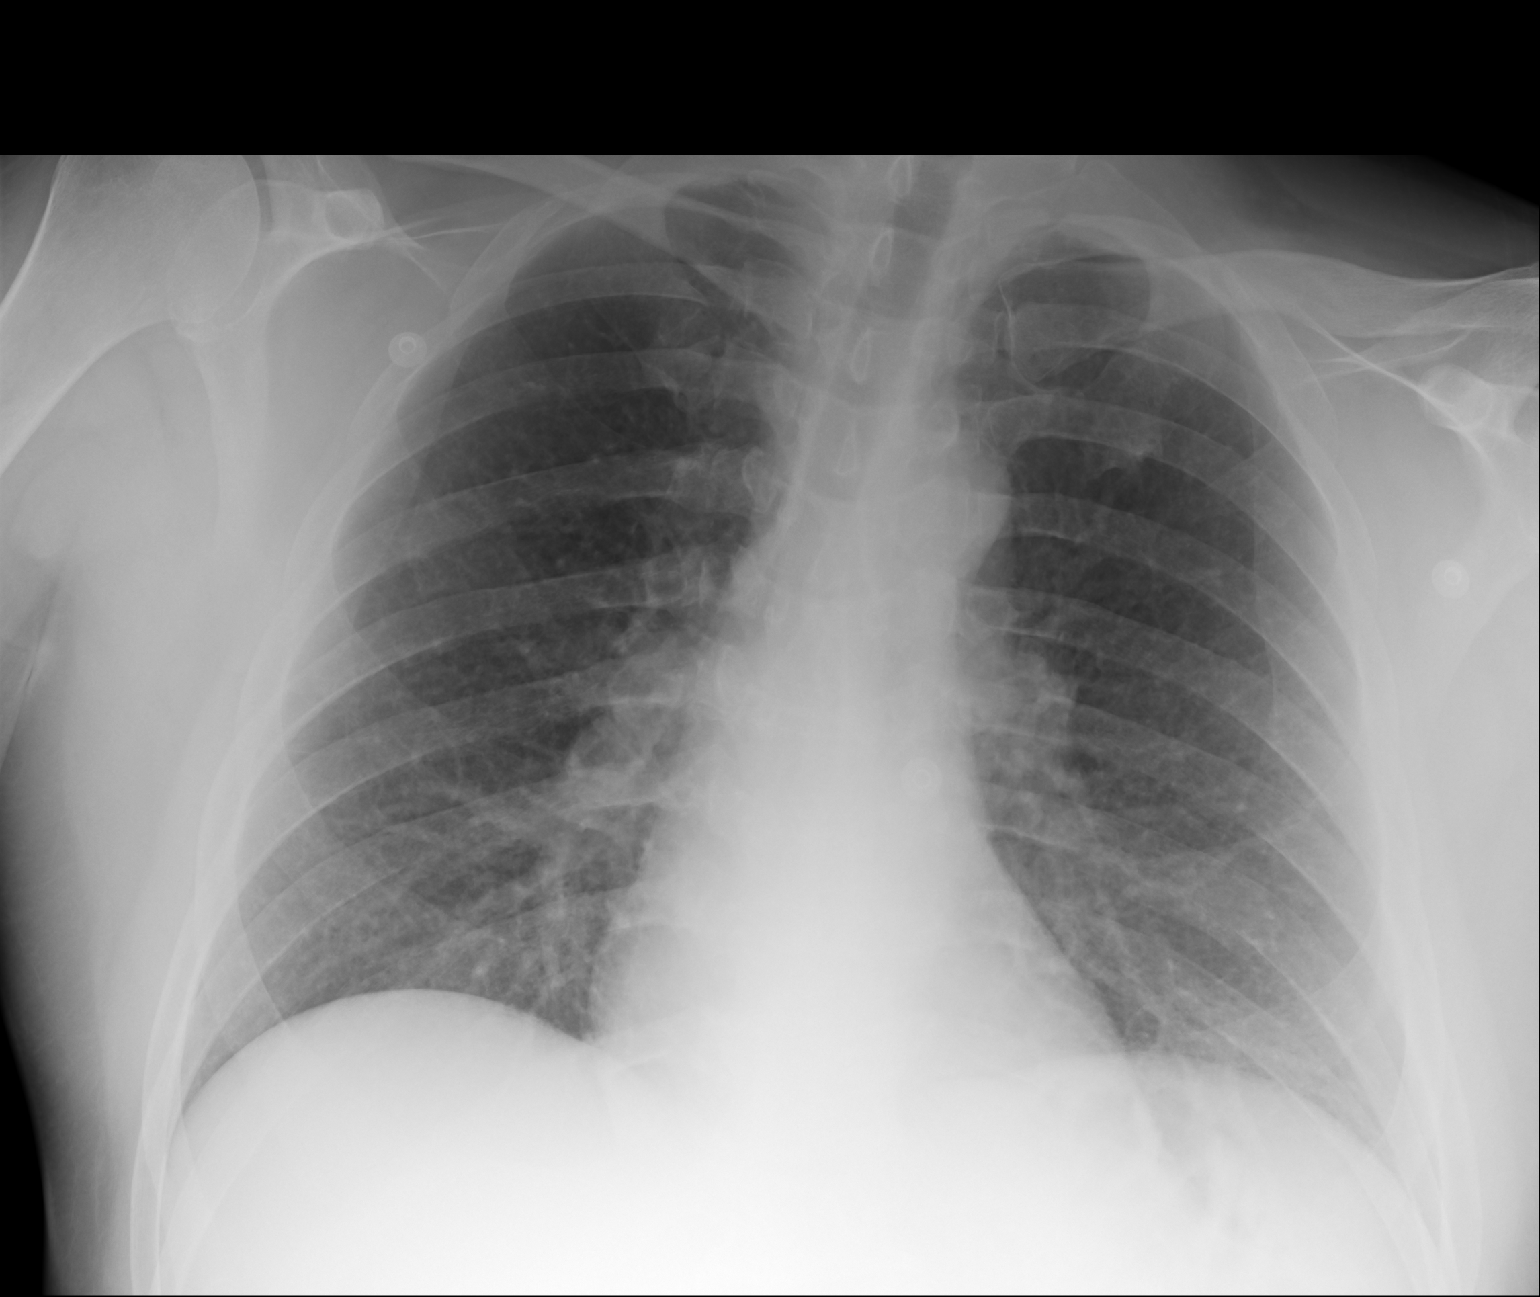

[1 of 1 positions shown; findings below may reference images not displayed]

PROCEDURE:     DXR - DXR PORTABLE CHEST SINGLE VIEW  - September 04, 2013 [DATE]

RESULT:     Comparison is made to the study July 31, 2013.

The lungs are adequately inflated. There is no focal infiltrate. The cardiac
silhouette is normal in size. The mediastinum is normal in width. There is
no pleural effusion. The bony thorax is normal in appearance.
IMPRESSION: There is no evidence of acute cardiopulmonary abnormality.

[REDACTED]

## 2014-07-03 IMAGING — CT CT HEAD WITHOUT CONTRAST
1 series · 16 of 30 positions shown, 20 images · non-contrast
Comparison: none

REASON FOR EXAM: left arm weakness
COMMENTS:

PROCEDURE:     CT  - CT HEAD WITHOUT CONTRAST  - September 05, 2013 [DATE]
RESULT:     Comparison:  None
TECHNIQUE: Multiple axial images from the foramen magnum to the vertex were
obtained without IV contrast.

[Series 2: soft tissue · axial · 0.43mm/px · z∈[-10,+125]mm · 16 of 31 slices shown, 20 images]
[im 2/31  brain]
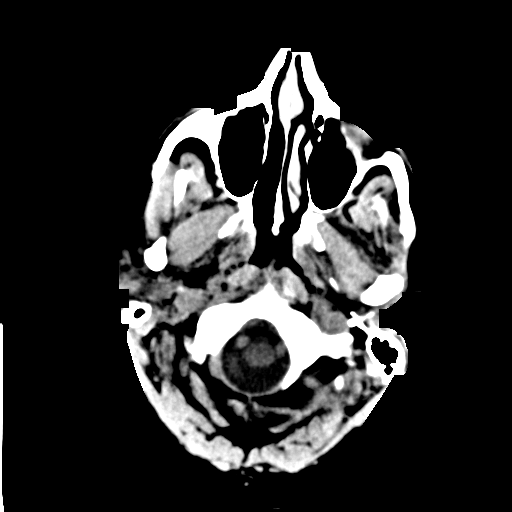
[im 2/31  bone]
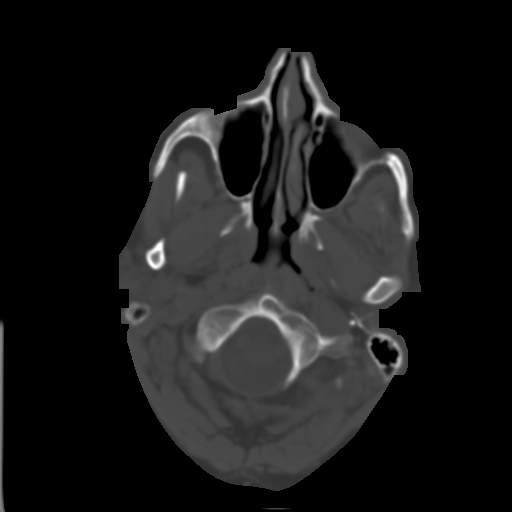
[im 4/31  brain]
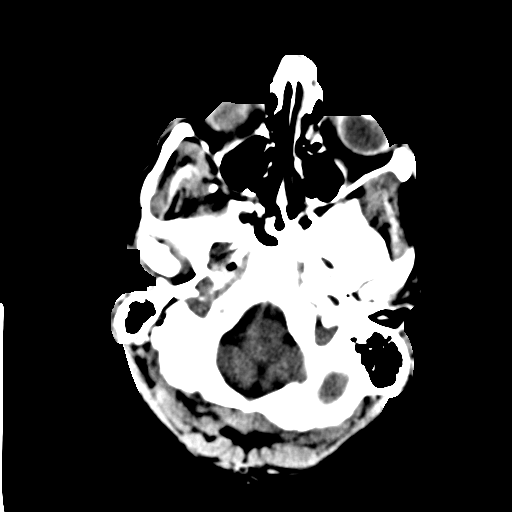
[im 6/31  brain]
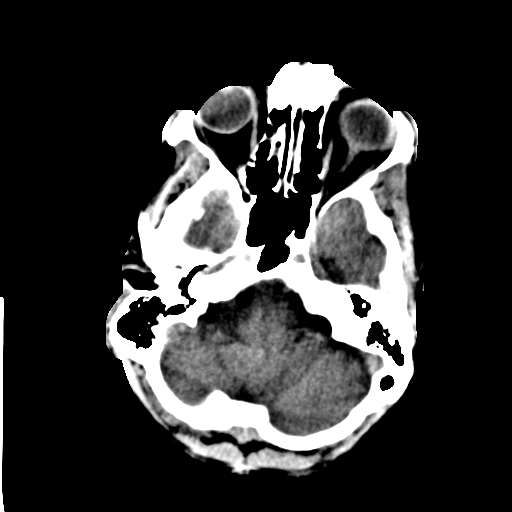
[im 8/31  brain]
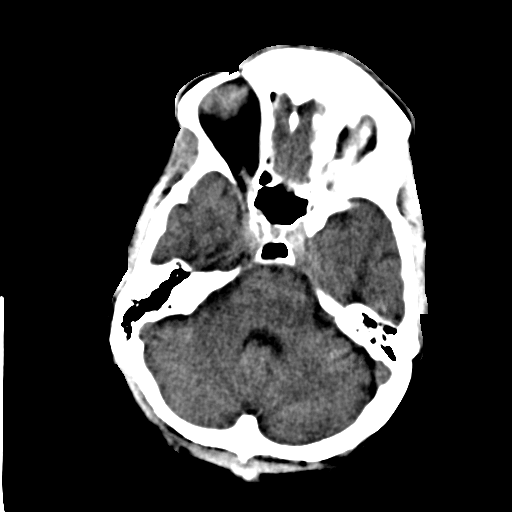
[im 9/31  brain]
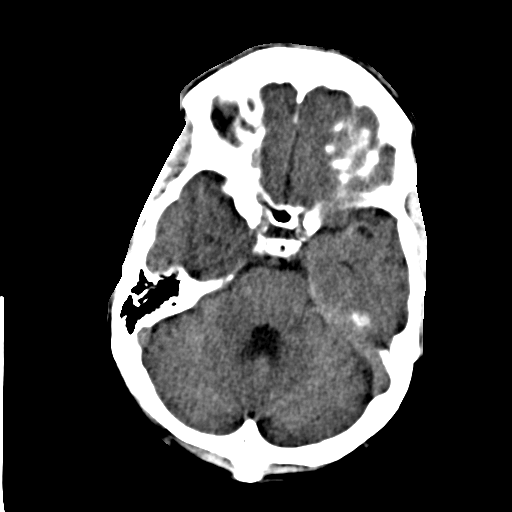
[im 9/31  bone]
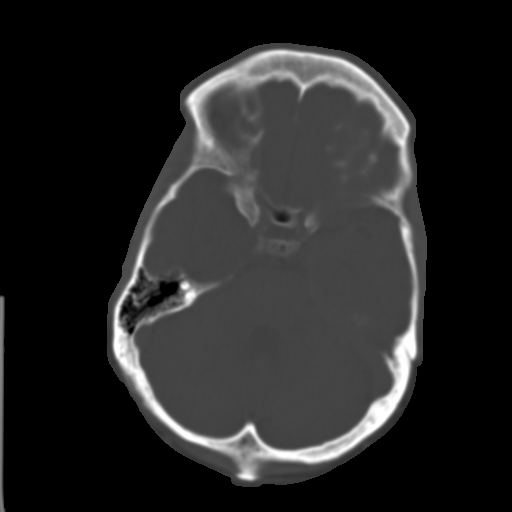
[im 11/31  brain]
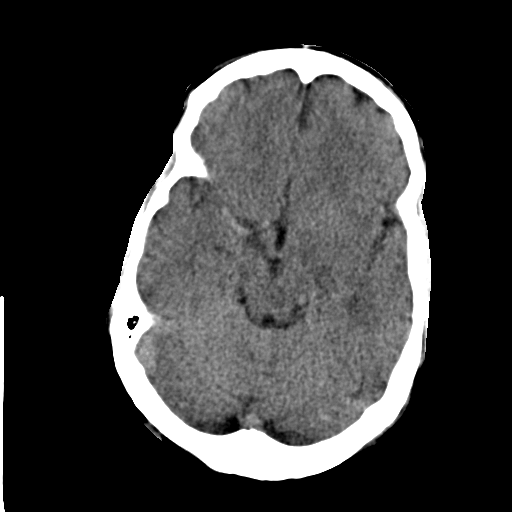
[im 13/31  brain]
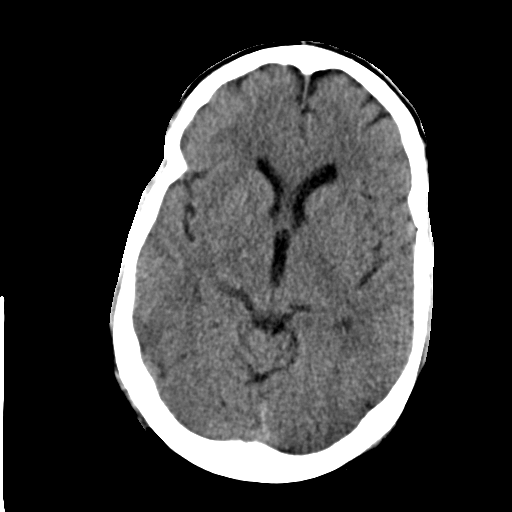
[im 15/31  brain]
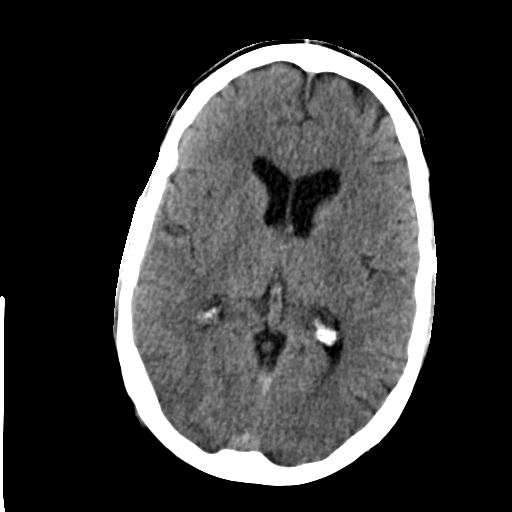
[im 16/31  brain]
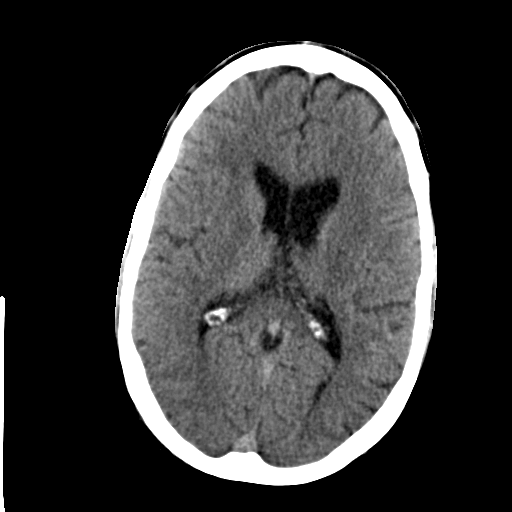
[im 16/31  bone]
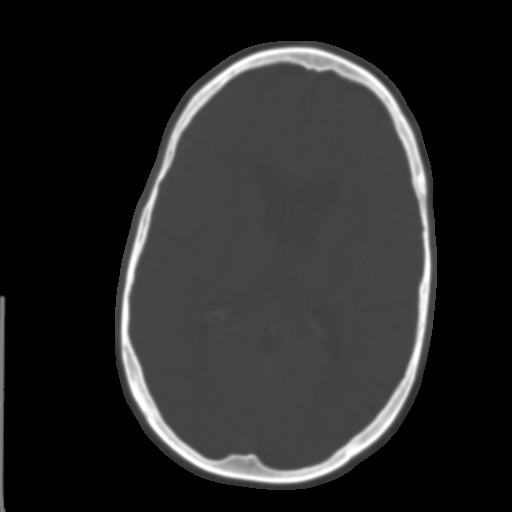
[im 18/31  brain]
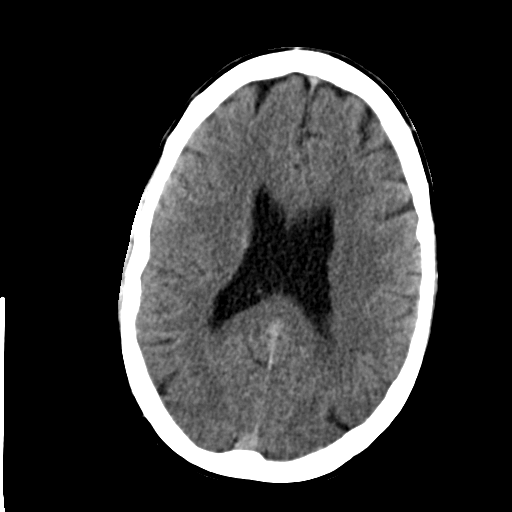
[im 20/31  brain]
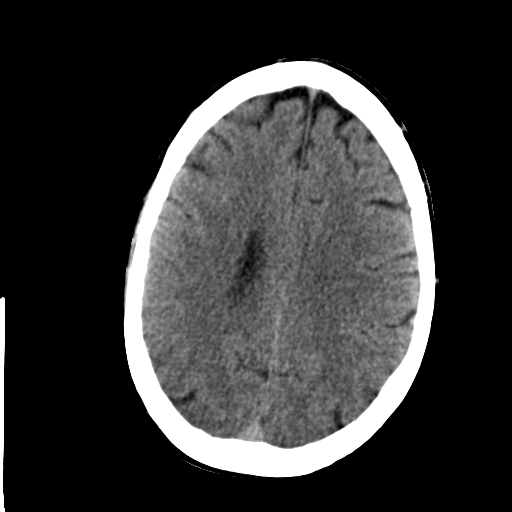
[im 22/31  brain]
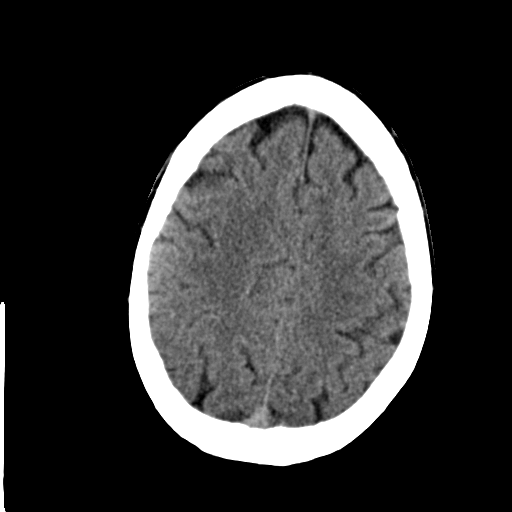
[im 23/31  brain]
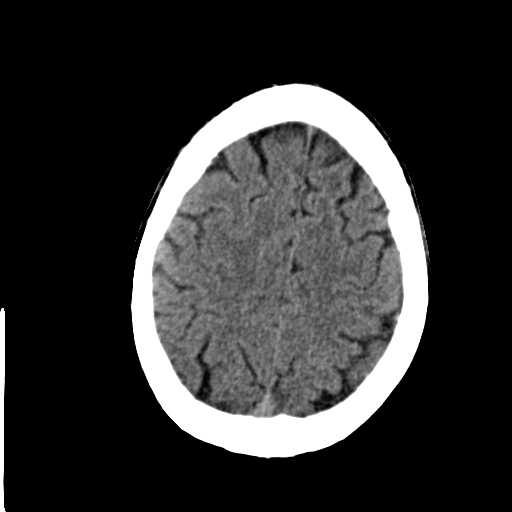
[im 23/31  bone]
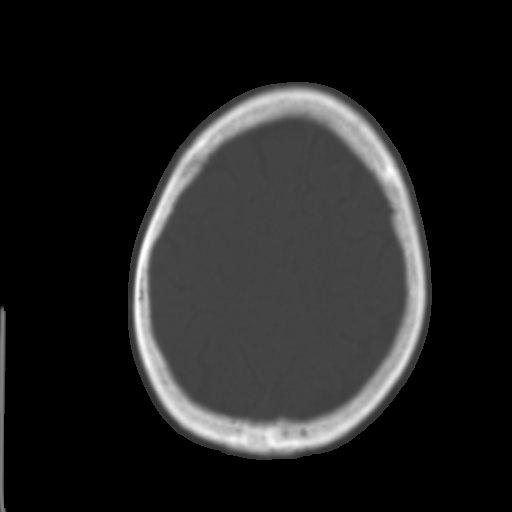
[im 25/31  brain]
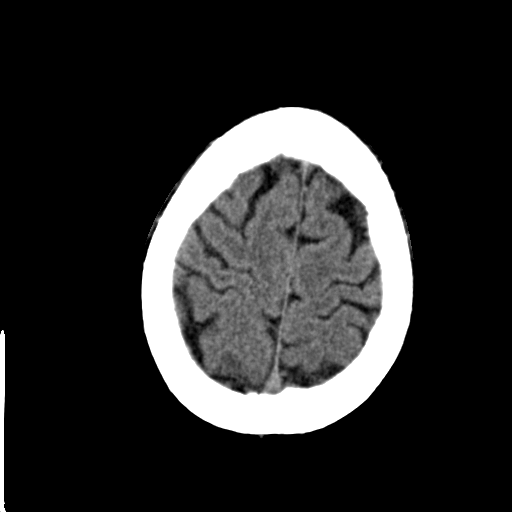
[im 27/31  brain]
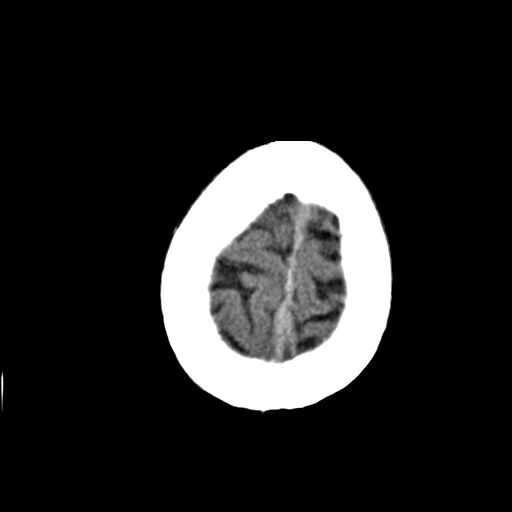
[im 29/31  brain]
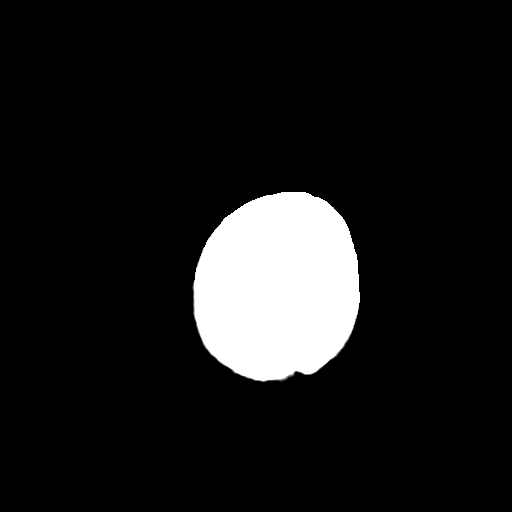

[16 of 30 positions shown; findings below may reference images not displayed]

FINDINGS: There is no evidence of mass effect, midline shift, or extra-axial fluid
collections.  There is no evidence of a space-occupying lesion or
intracranial hemorrhage. There is no evidence of a cortical-based area of
acute infarction.

The ventricles and sulci are appropriate for the patient's age. The basal
cisterns are patent.

Visualized portions of the orbits are unremarkable. The visualized portions
of the paranasal sinuses and mastoid air cells are unremarkable.

The osseous structures are unremarkable.
IMPRESSION: No acute intracranial process.

[REDACTED]

## 2014-07-08 ENCOUNTER — Emergency Department: Payer: Self-pay | Admitting: Emergency Medicine

## 2014-07-08 LAB — PROTIME-INR
INR: 1
Prothrombin Time: 12.6 secs (ref 11.5–14.7)

## 2014-07-08 LAB — TROPONIN I: Troponin-I: <0.02 ng/mL

## 2014-07-08 LAB — CBC WITH DIFFERENTIAL/PLATELET
Basophil #: 0.2 10*3/uL — ABNORMAL HIGH (ref 0.0–0.1)
Basophil %: 1.9 %
EOS ABS: 0.2 10*3/uL (ref 0.0–0.7)
Eosinophil %: 1.3 %
HCT: 54.6 % — AB (ref 40.0–52.0)
HGB: 18 g/dL (ref 13.0–18.0)
LYMPHS ABS: 5.2 10*3/uL — AB (ref 1.0–3.6)
Lymphocyte %: 38.9 %
MCH: 29.6 pg (ref 26.0–34.0)
MCHC: 32.9 g/dL (ref 32.0–36.0)
MCV: 90 fL (ref 80–100)
MONO ABS: 0.9 x10 3/mm (ref 0.2–1.0)
MONOS PCT: 6.5 %
NEUTROS PCT: 51.4 %
Neutrophil #: 6.9 10*3/uL — ABNORMAL HIGH (ref 1.4–6.5)
Platelet: 218 10*3/uL (ref 150–440)
RBC: 6.07 10*6/uL — AB (ref 4.40–5.90)
RDW: 13.5 % (ref 11.5–14.5)
WBC: 13.3 10*3/uL — ABNORMAL HIGH (ref 3.8–10.6)

## 2014-07-08 LAB — COMPREHENSIVE METABOLIC PANEL
Albumin: 3.7 g/dL (ref 3.4–5.0)
Alkaline Phosphatase: 70 U/L
Anion Gap: 7 (ref 7–16)
BILIRUBIN TOTAL: 0.4 mg/dL (ref 0.2–1.0)
BUN: 10 mg/dL (ref 7–18)
CALCIUM: 8.2 mg/dL — AB (ref 8.5–10.1)
CO2: 26 mmol/L (ref 21–32)
CREATININE: 0.95 mg/dL (ref 0.60–1.30)
Chloride: 108 mmol/L — ABNORMAL HIGH (ref 98–107)
EGFR (African American): >60
EGFR (Non-African Amer.): >60
Glucose: 106 mg/dL — ABNORMAL HIGH (ref 65–99)
Osmolality: 281 (ref 275–301)
POTASSIUM: 3.8 mmol/L (ref 3.5–5.1)
SGOT(AST): 45 U/L — ABNORMAL HIGH (ref 15–37)
SGPT (ALT): 61 U/L (ref 12–78)
Sodium: 141 mmol/L (ref 136–145)
Total Protein: 7.5 g/dL (ref 6.4–8.2)

## 2014-07-08 LAB — URINALYSIS, COMPLETE
Bacteria: NONE SEEN
Bilirubin,UR: NEGATIVE
Blood: NEGATIVE
Glucose,UR: NEGATIVE mg/dL (ref 0–75)
KETONE: NEGATIVE
LEUKOCYTE ESTERASE: NEGATIVE
NITRITE: NEGATIVE
PH: 5 (ref 4.5–8.0)
Protein: NEGATIVE
SPECIFIC GRAVITY: 1.008 (ref 1.003–1.030)
Squamous Epithelial: NONE SEEN
WBC UR: <1 /HPF (ref 0–5)

## 2014-07-08 LAB — DRUG SCREEN, URINE
Amphetamines, Ur Screen: NEGATIVE (ref ?–1000)
BARBITURATES, UR SCREEN: NEGATIVE (ref ?–200)
Benzodiazepine, Ur Scrn: NEGATIVE (ref ?–200)
CANNABINOID 50 NG, UR ~~LOC~~: NEGATIVE (ref ?–50)
Cocaine Metabolite,Ur ~~LOC~~: POSITIVE (ref ?–300)
MDMA (Ecstasy)Ur Screen: NEGATIVE (ref ?–500)
Methadone, Ur Screen: NEGATIVE (ref ?–300)
OPIATE, UR SCREEN: NEGATIVE (ref ?–300)
PHENCYCLIDINE (PCP) UR S: NEGATIVE (ref ?–25)
Tricyclic, Ur Screen: NEGATIVE (ref ?–1000)

## 2014-07-08 LAB — LIPASE, BLOOD: Lipase: 150 U/L (ref 73–393)

## 2014-07-08 LAB — ETHANOL
ETHANOL %: 0.189 % — AB (ref 0.000–0.080)
Ethanol: 189 mg/dL

## 2014-07-08 LAB — CK TOTAL AND CKMB (NOT AT ARMC)
CK, Total: 89 U/L
CK-MB: 1.7 ng/mL (ref 0.5–3.6)

## 2014-08-09 IMAGING — CR DG OUTSIDE FILMS CHEST
1 series · 1 of 1 positions shown · non-contrast
Comparison: none

[ap]
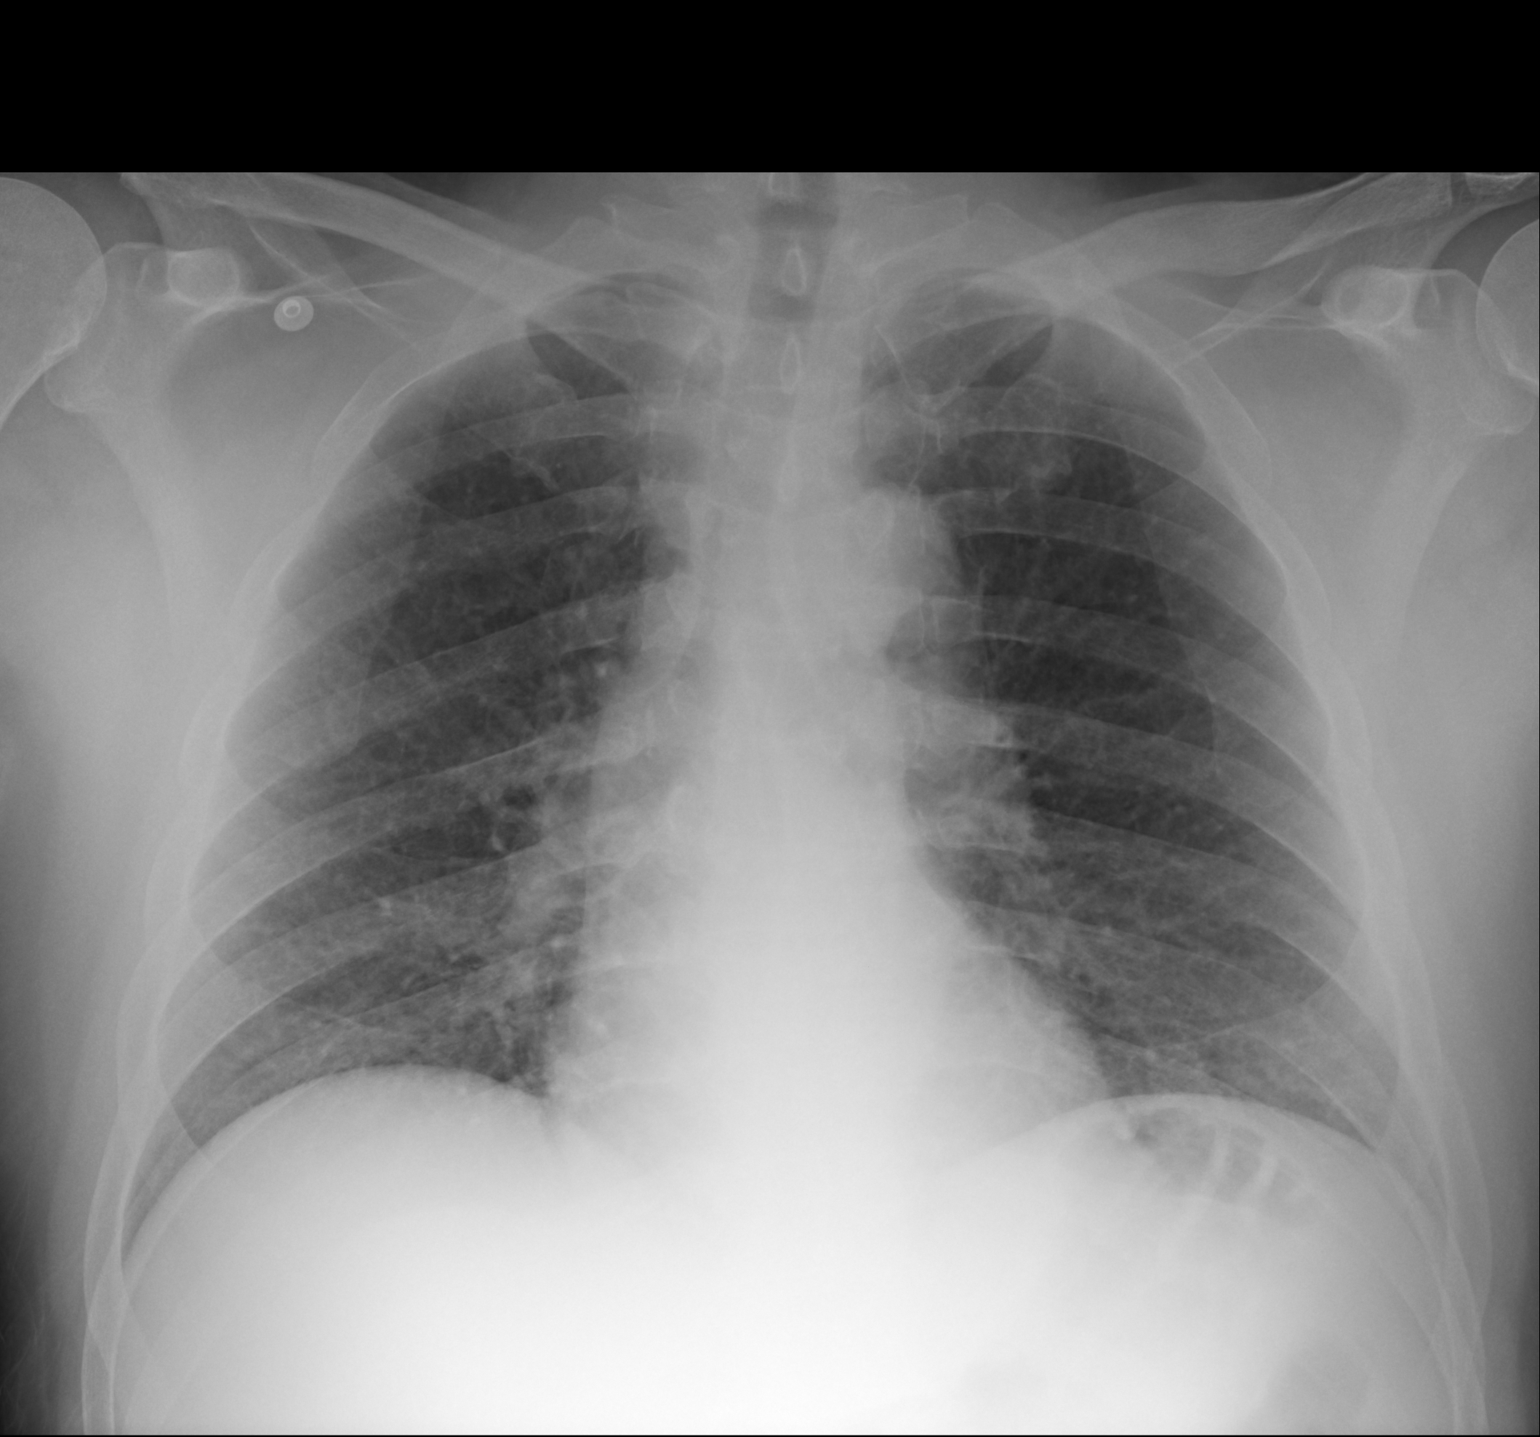

[1 of 1 positions shown; findings below may reference images not displayed]

Canned report from images found in remote index.

Refer to host system for actual result text.

## 2014-08-13 IMAGING — CR DG CHEST 1V PORT
1 series · 1 of 1 positions shown · non-contrast
Comparison: none

REASON FOR EXAM: Chest pain
COMMENTS:

PROCEDURE:     DXR - DXR PORTABLE CHEST SINGLE VIEW  - October 16, 2013  [DATE]
RESULT:     The lungs are clear. The cardiac silhouette and visualized bony
skeleton are unremarkable.

[ap]
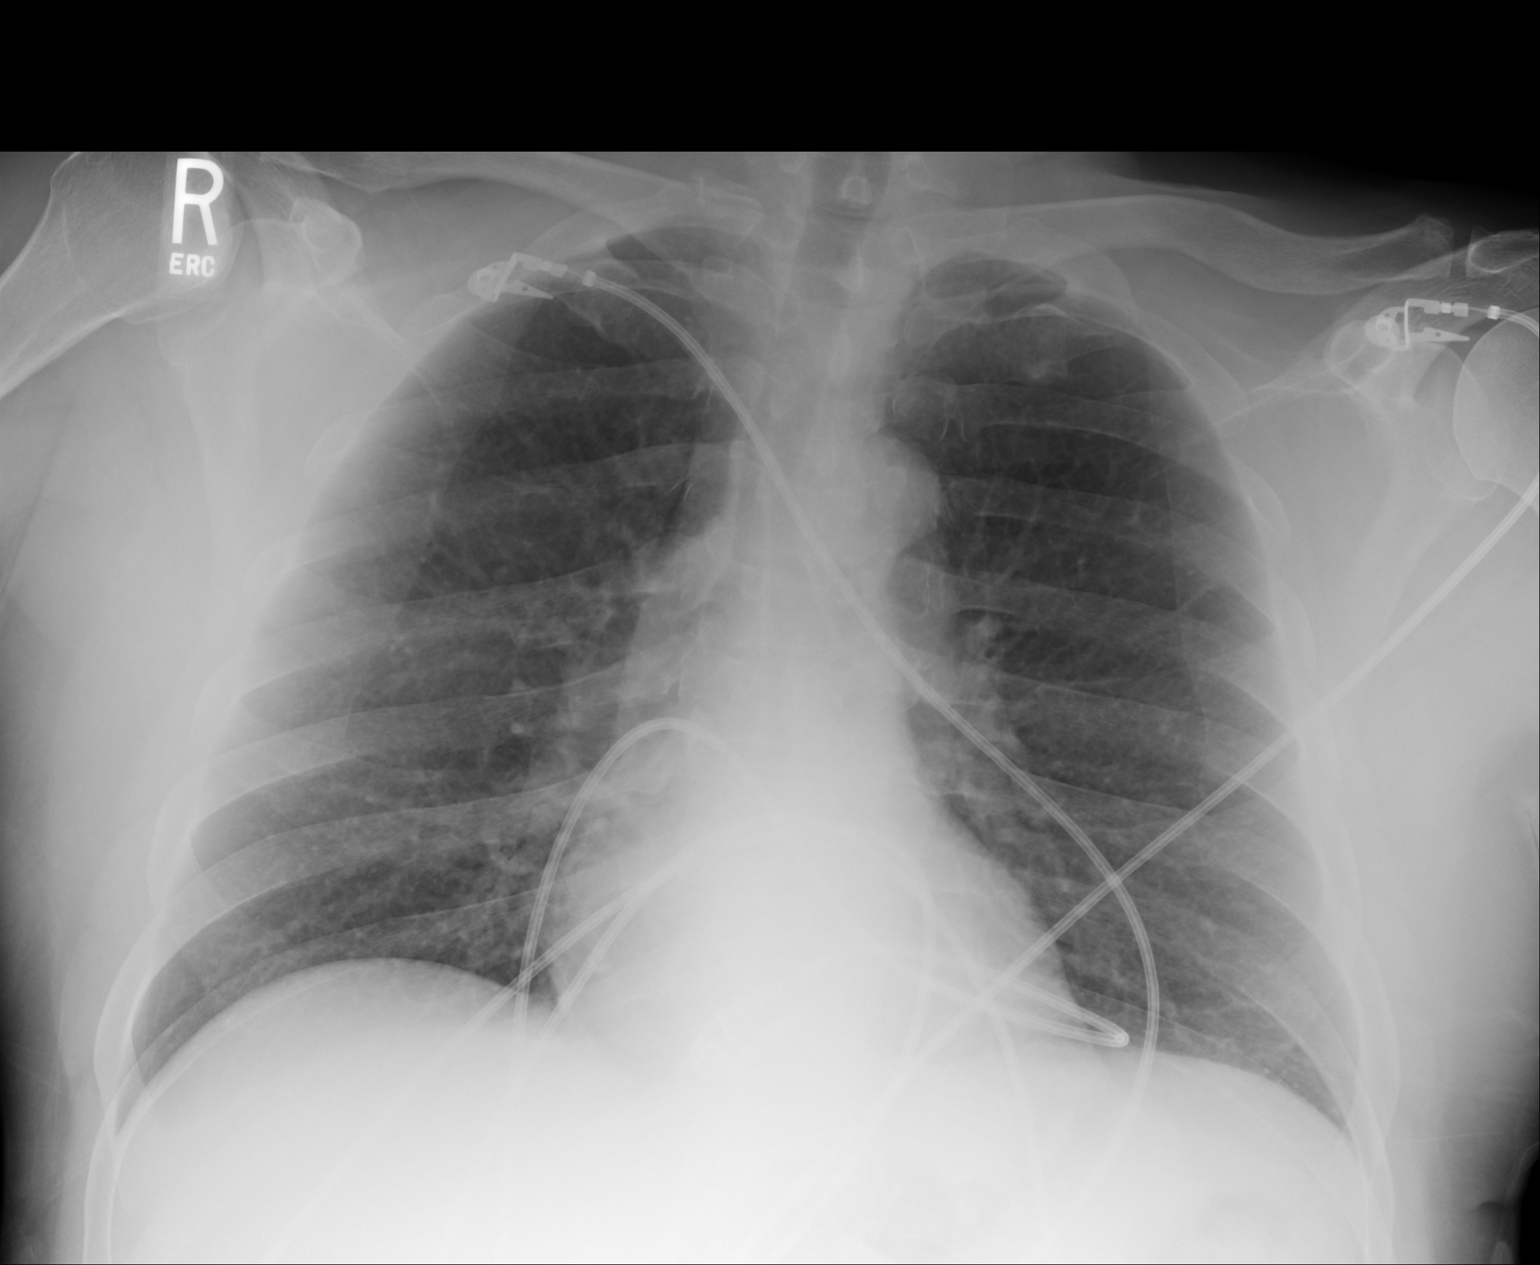

[1 of 1 positions shown; findings below may reference images not displayed]

IMPRESSION: 1. Chest radiograph without evidence of acute cardiopulmonary disease.
2. Comparison 10/12/2013

## 2014-08-13 IMAGING — CR DG ANKLE COMPLETE 3+V*L*
1 series · 5 of 5 positions shown · non-contrast
Comparison: none

REASON FOR EXAM: PAIN SP FALL
COMMENTS:

PROCEDURE:     DXR - DXR ANKLE LEFT COMPLETE  - October 16, 2013  [DATE]
RESULT:     There is no evidence of fracture, dislocation, or malalignment.

[Series 1: ap · 0.17mm/px · 5 of 5 slices shown]
[im 1/5]
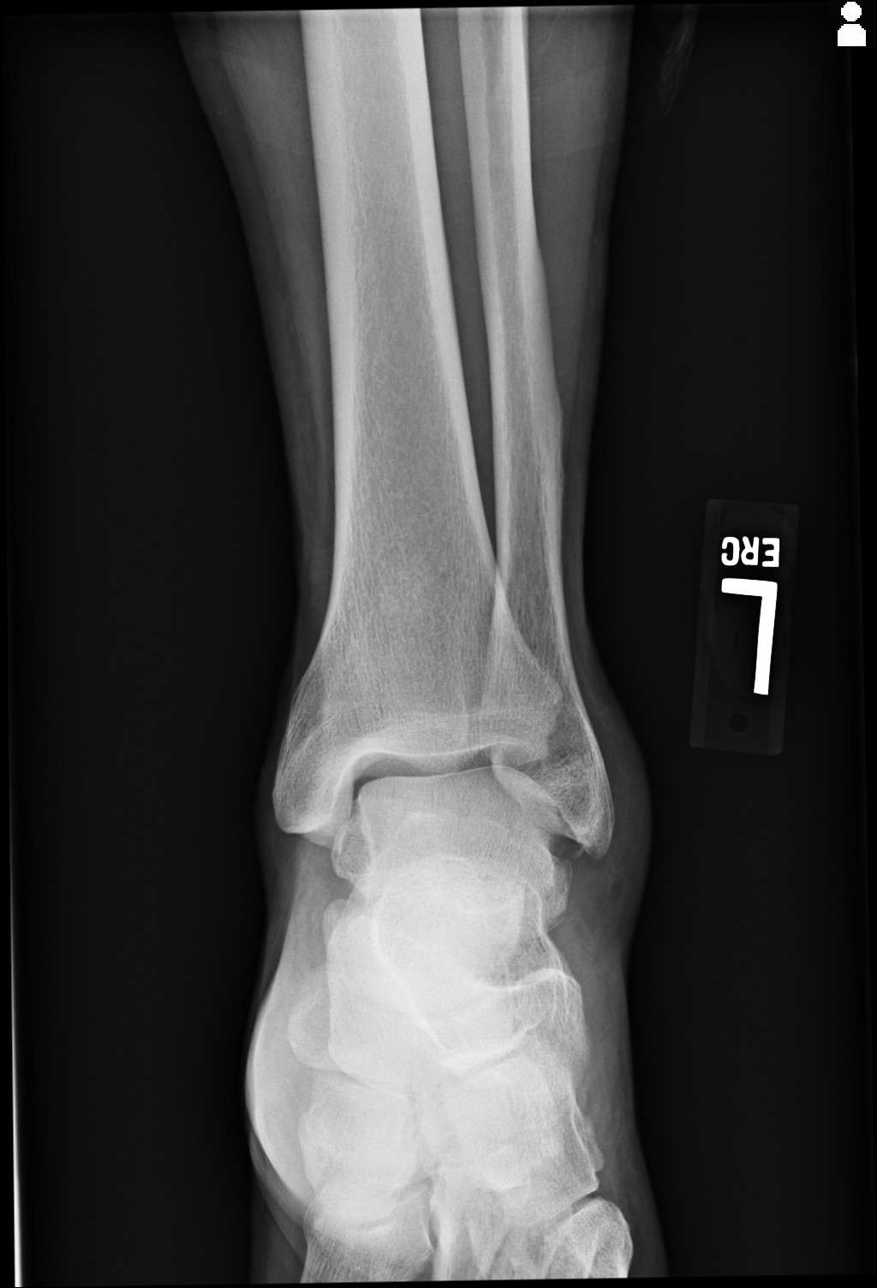
[im 2/5]
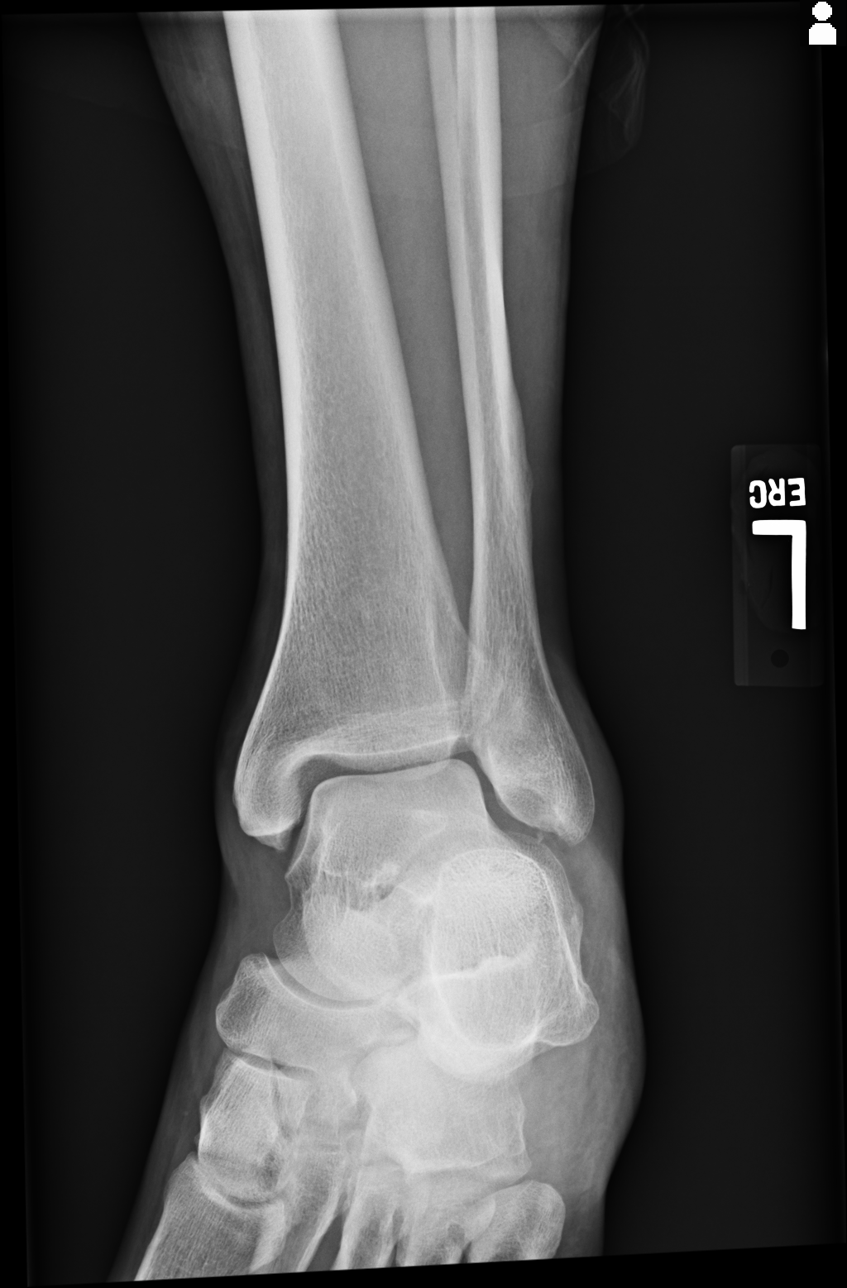
[im 3/5]
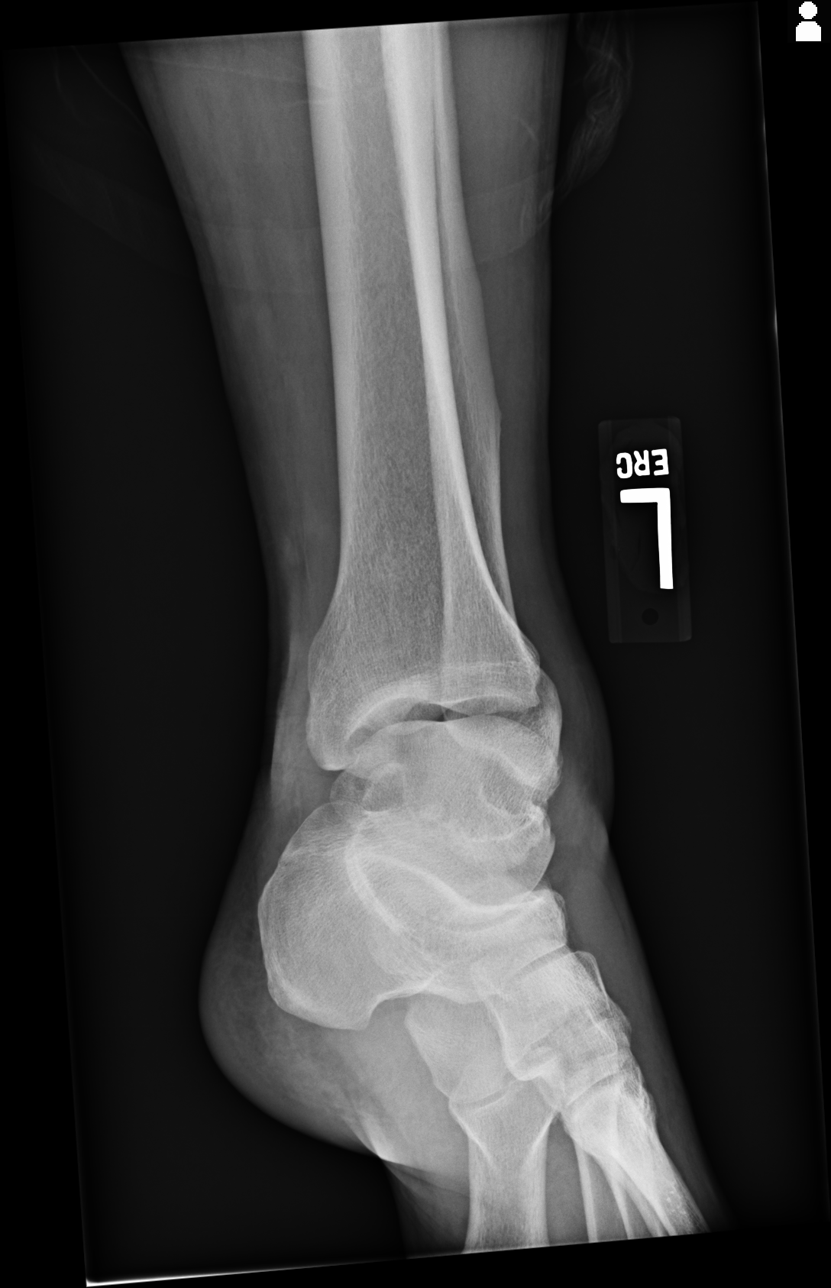
[im 4/5]
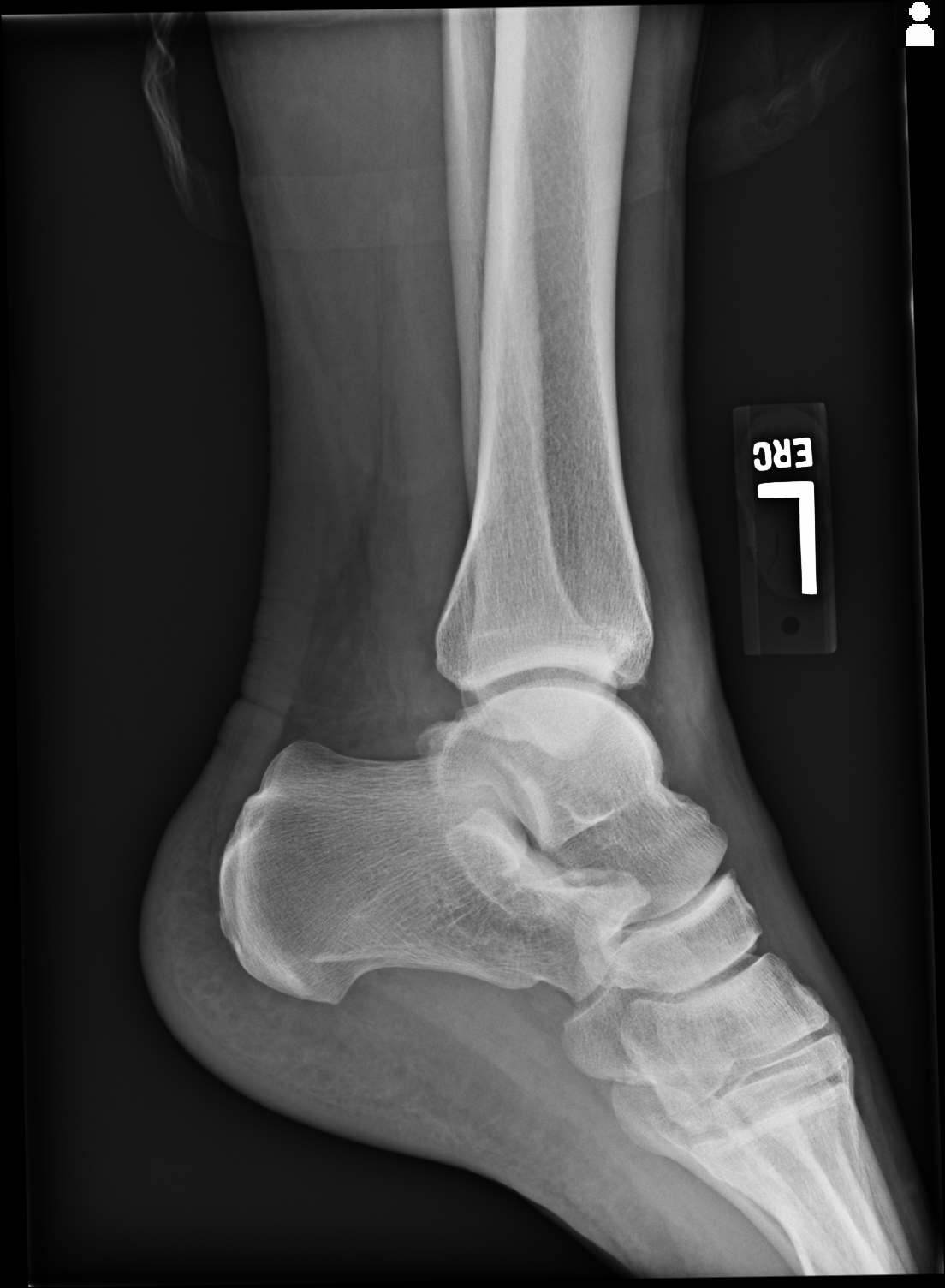
[im 5/5]
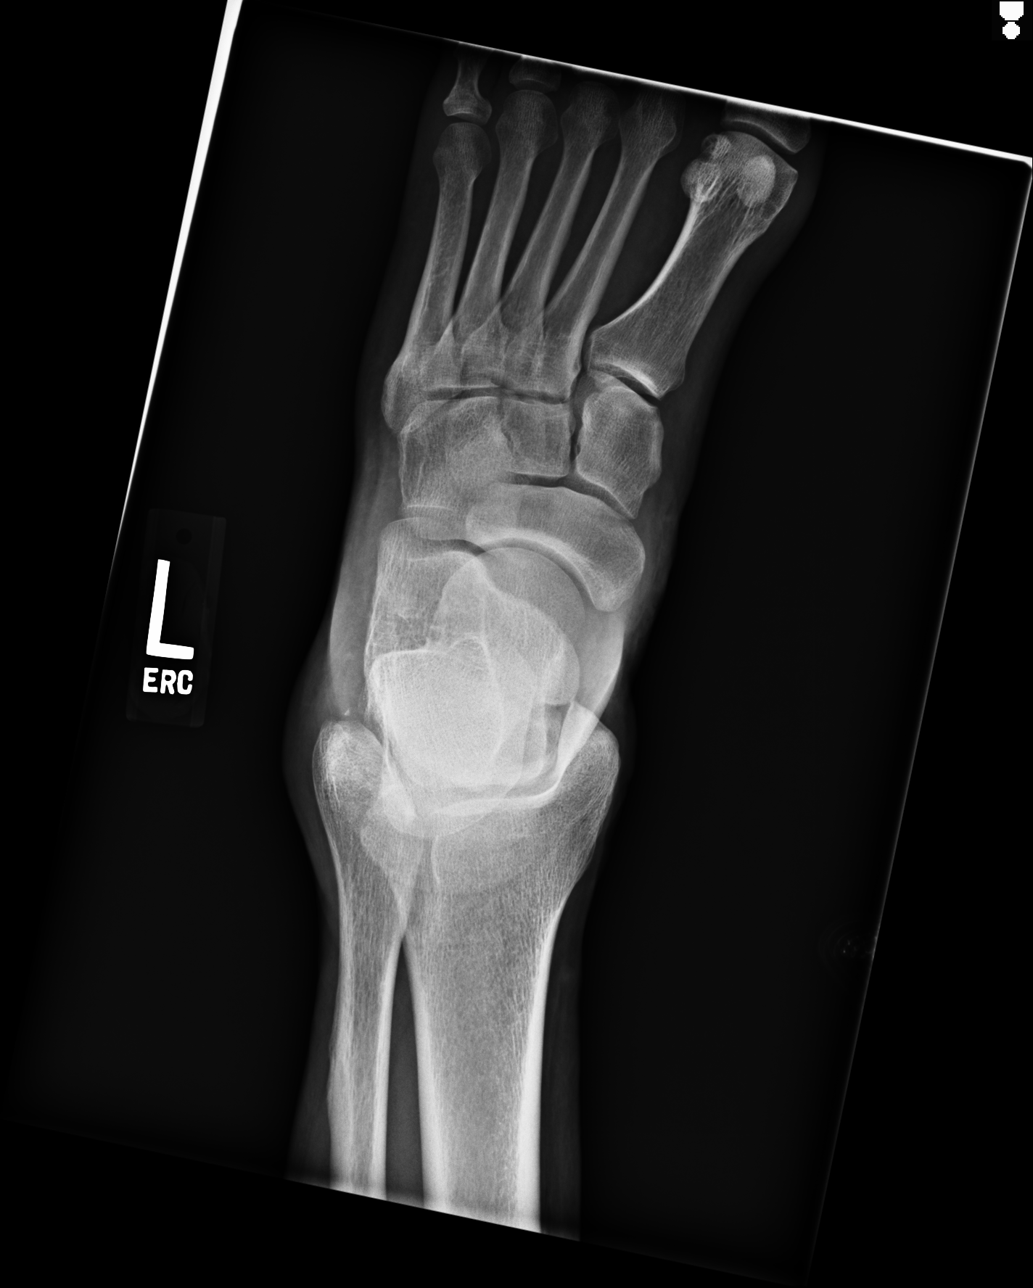

[5 of 5 positions shown; findings below may reference images not displayed]

IMPRESSION: 1. No evidence of acute abnormalities.
2. If there are persistent complaints of pain or persistent clinical
concern, a repeat evaluation in 7-10 days is recommended if clinically
warranted.

## 2014-09-12 IMAGING — CT CT HEAD W/O CM
2 series · 16 of 30 positions shown, 20 images · non-contrast
Comparison: None.

CLINICAL DATA: Acute stroke.

EXAM:
CT HEAD WITHOUT CONTRAST
TECHNIQUE: Contiguous axial images were obtained from the base of the skull
through the vertex without intravenous contrast.

[Series 2: head w/o · axial · non-contrast · 0.49mm/px · z∈[+132,+262]mm · 13 of 32 slices shown, 17 images]
[im 3/32  brain]
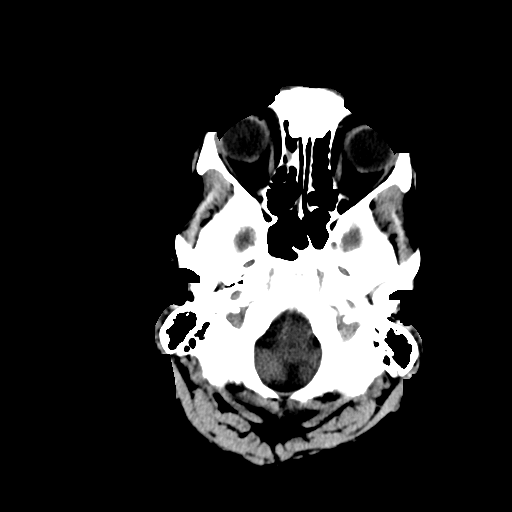
[im 3/32  bone]
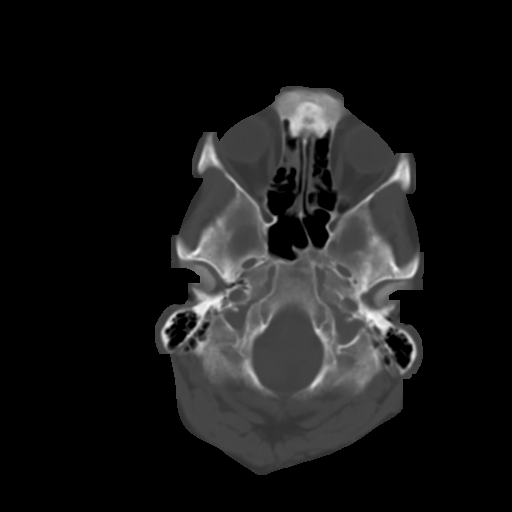
[im 5/32  brain]
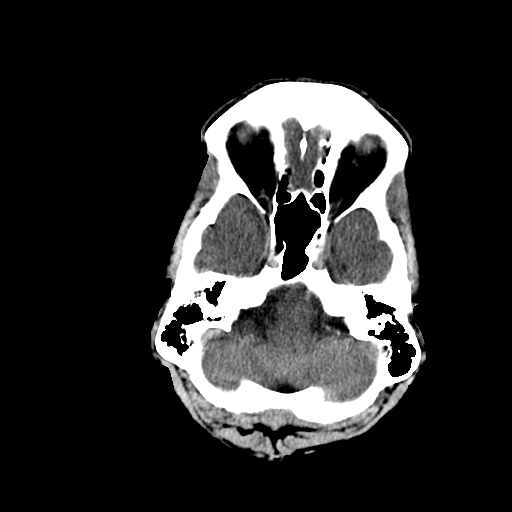
[im 7/32  brain]
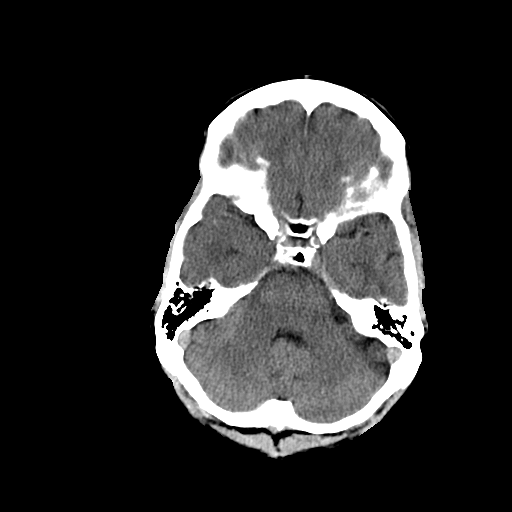
[im 9/32  brain]
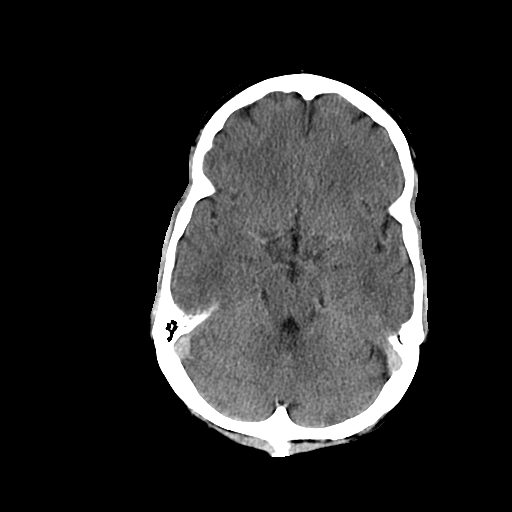
[im 12/32  brain]
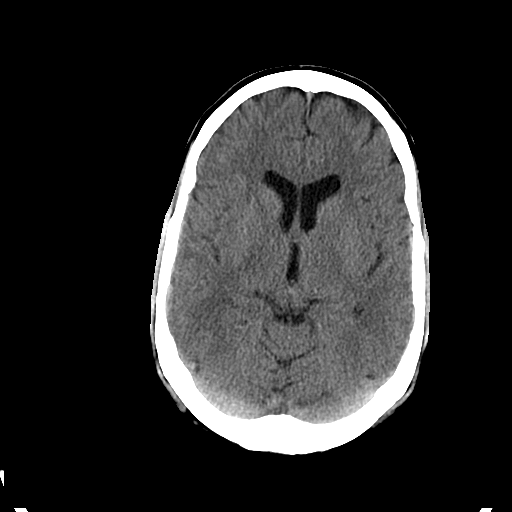
[im 12/32  bone]
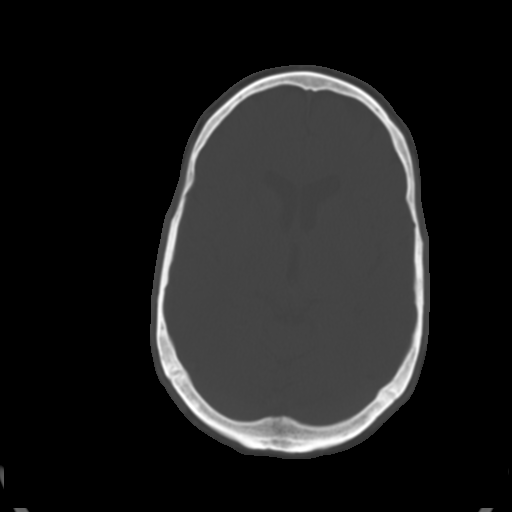
[im 14/32  brain]
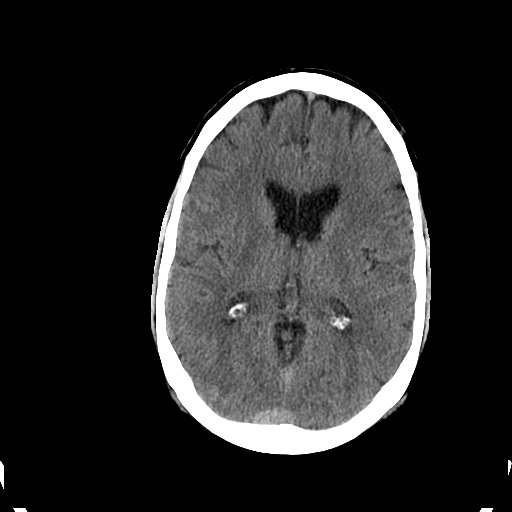
[im 16/32  brain]
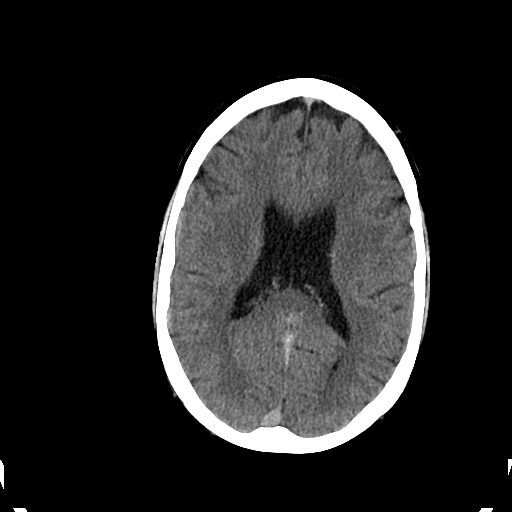
[im 18/32  brain]
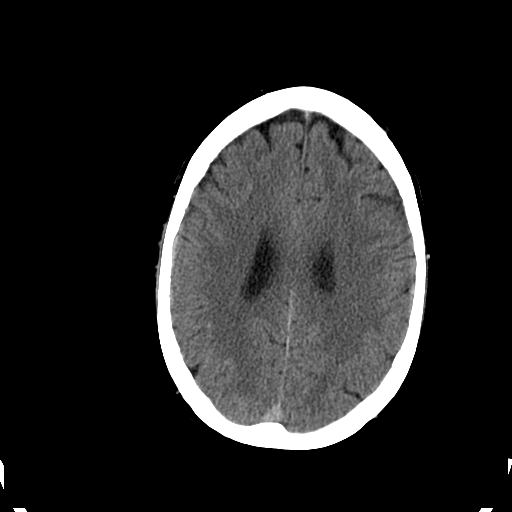
[im 20/32  brain]
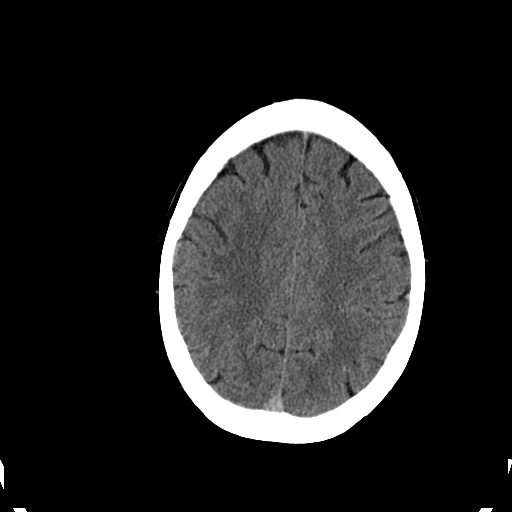
[im 20/32  bone]
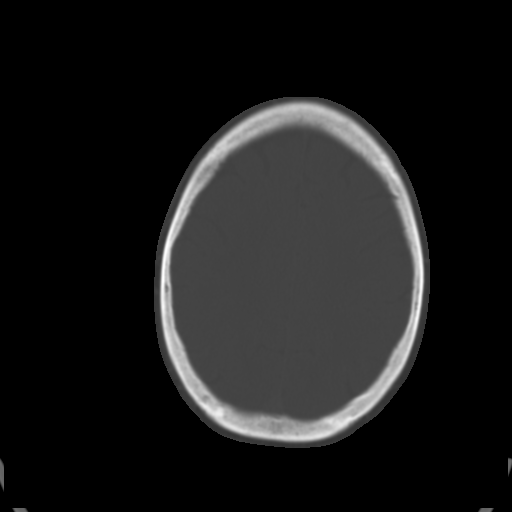
[im 23/32  brain]
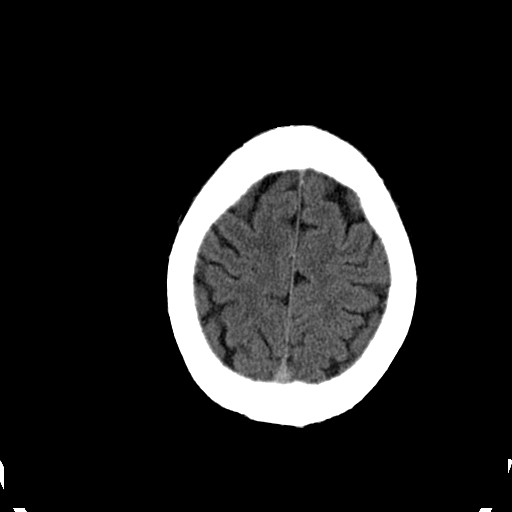
[im 25/32  brain]
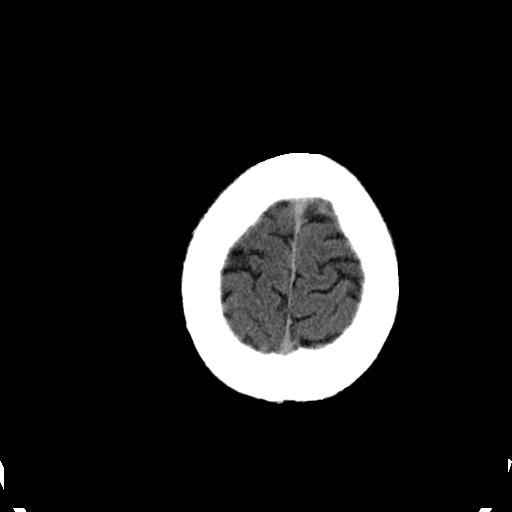
[im 27/32  brain]
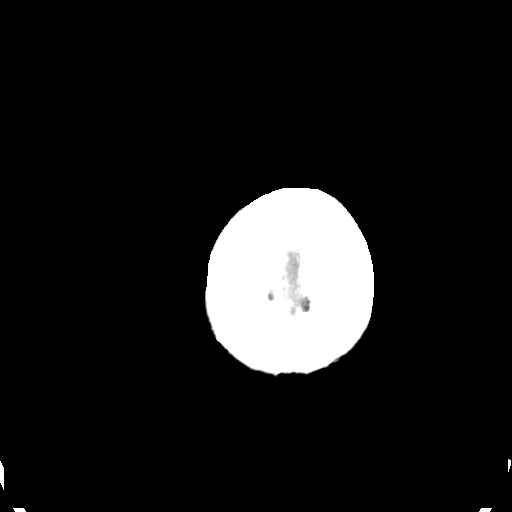
[im 29/32  brain]
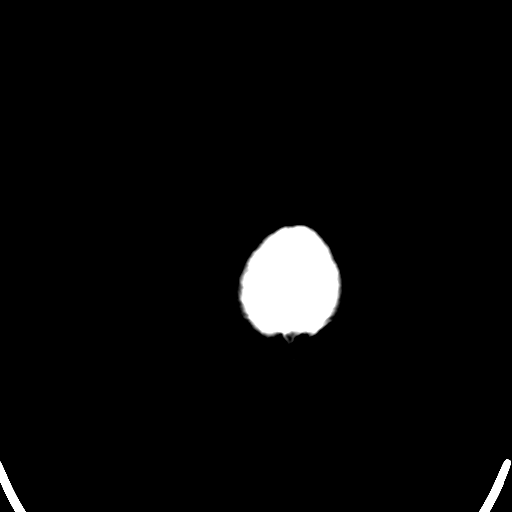
[im 29/32  bone]
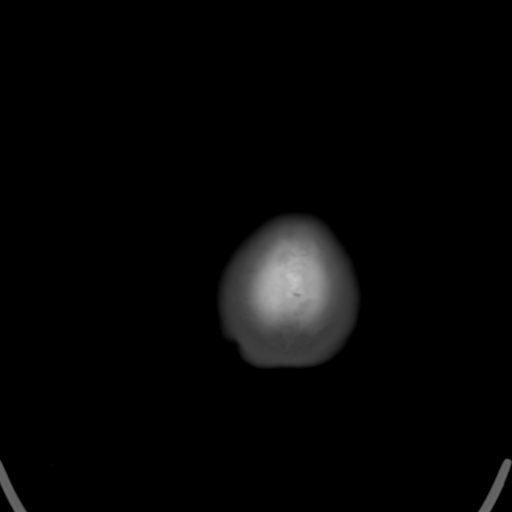

[Series 3: head w/o bone · axial · non-contrast · 0.49mm/px · z∈[+132,+178]mm · 3 of 32 slices shown]
[im 3/32  bone]
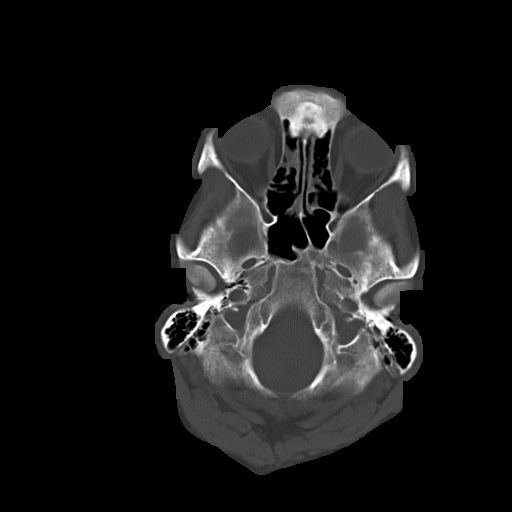
[im 7/32  bone]
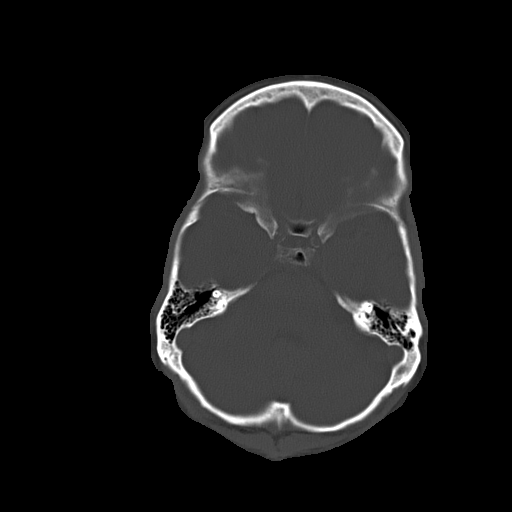
[im 12/32  bone]
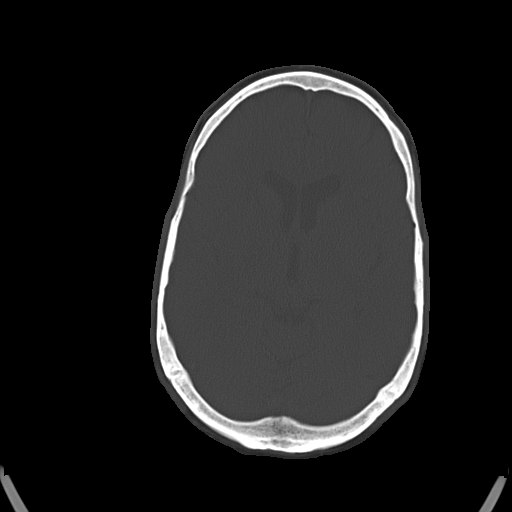

[16 of 30 positions shown; findings below may reference images not displayed]

FINDINGS: No evidence of intracranial hemorrhage, brain edema, or other signs
of acute infarction. No evidence of intracranial mass lesion or mass
effect. No abnormal extraaxial fluid collections identified.
Ventricles are normal in size. No skull abnormality identified.
IMPRESSION: Negative noncontrast head CT.

These results were called by telephone at the time of interpretation
on 11/15/2013 at [DATE] to Dr. Nadana Stha, who verbally
acknowledged these results.

## 2014-09-13 IMAGING — CR DG CHEST 1V PORT
1 series · 1 of 1 positions shown · non-contrast
Comparison: None.

CLINICAL DATA: Chest pain.

EXAM:
PORTABLE CHEST - 1 VIEW

[AP]
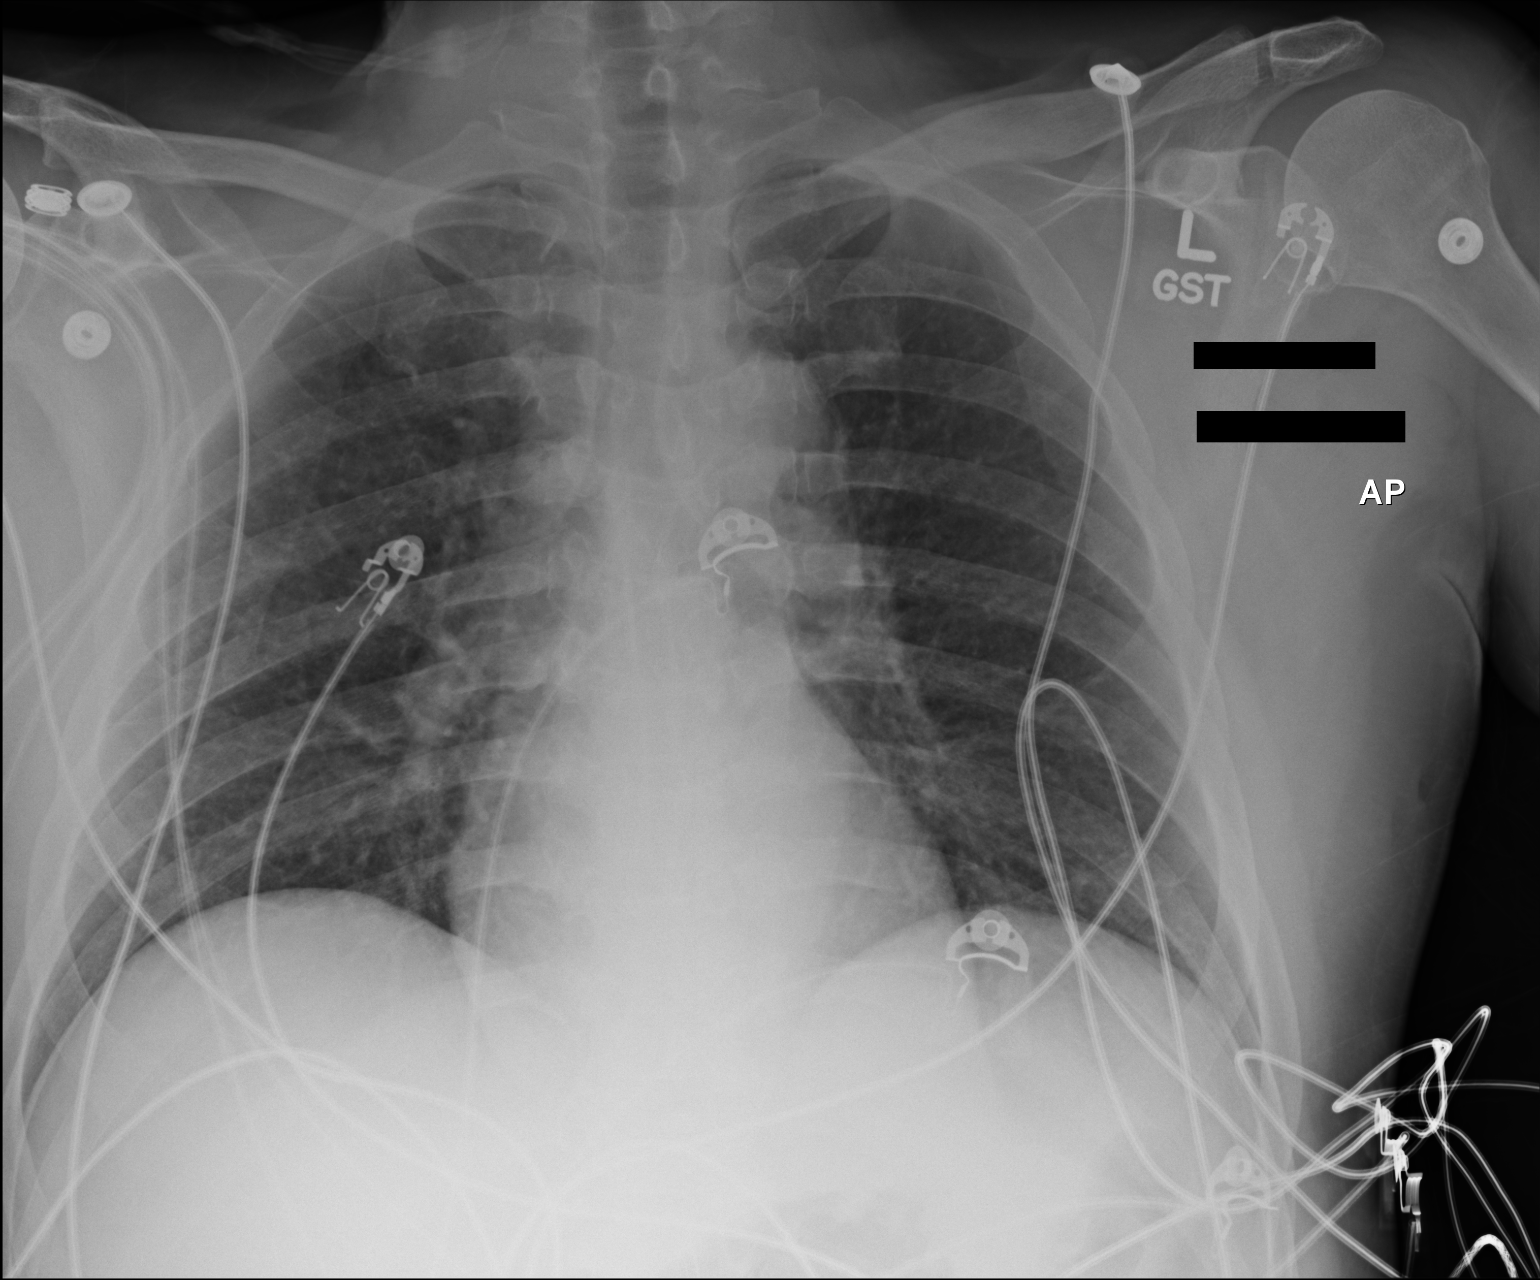

[1 of 1 positions shown; findings below may reference images not displayed]

FINDINGS: The heart size and mediastinal contours are within normal limits.
Both lungs are clear. The visualized skeletal structures are
unremarkable.
IMPRESSION: No active disease.

## 2014-09-13 IMAGING — MR MR MRA HEAD W/O CM
6 of 7 series · 28 of 48 positions shown · non-contrast
Comparison: None.

CT from 11/15/2013.

CLINICAL DATA: Left-sided hemiplegia.

EXAM:
MRI HEAD WITHOUT CONTRAST
MRA HEAD WITHOUT CONTRAST
TECHNIQUE: Multiplanar, multiecho pulse sequences of the brain and surrounding
structures were obtained without intravenous contrast. Angiographic
images of the head were obtained using MRA technique without
contrast.

[Series 3: (id) mt fs · axial · 1.4mm · 0.43mm/px · z∈[-74,-36]mm · 4 of 137 slices shown]
[im 7/137]
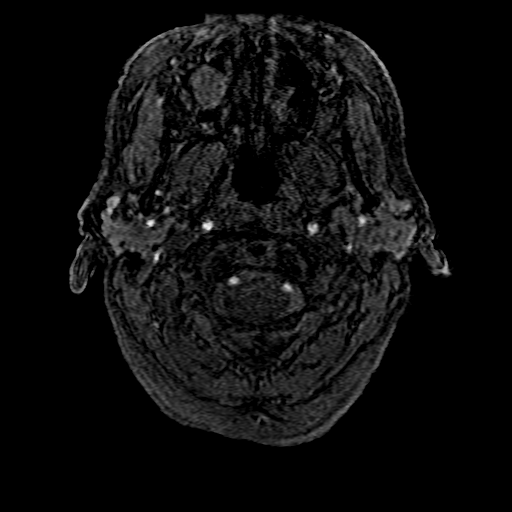
[im 21/137]
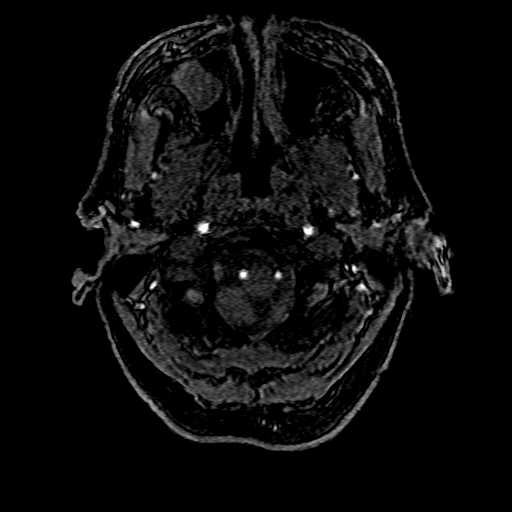
[im 41/137]
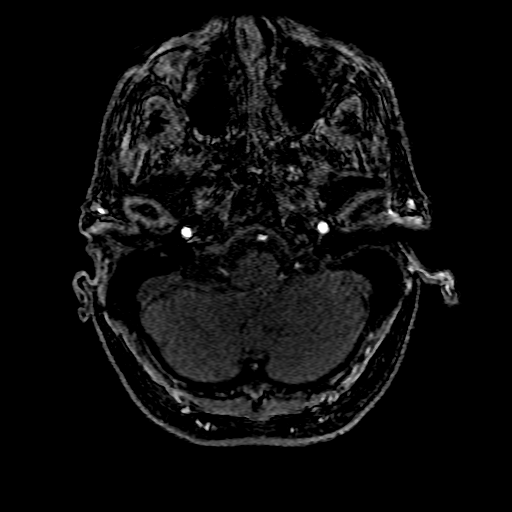
[im 62/137]
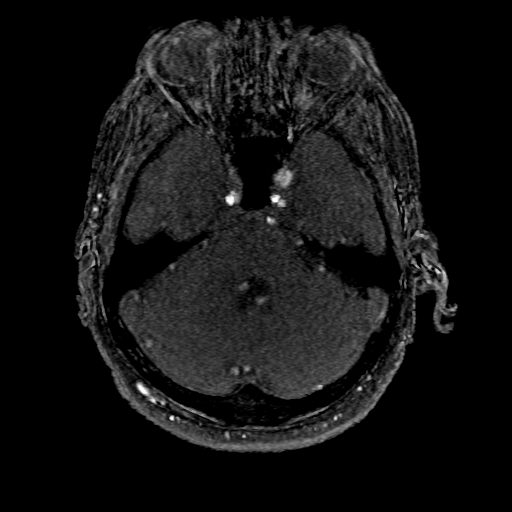

[Series 4: T1 · sagittal · 5.0mm · 0.47mm/px · 3 of 19 slices shown]
[im 1/19]
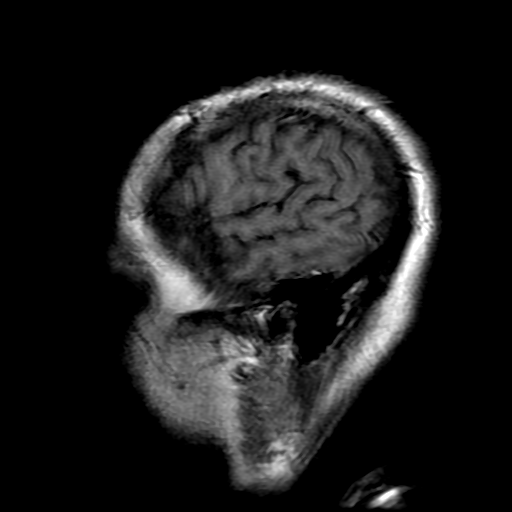
[im 10/19]
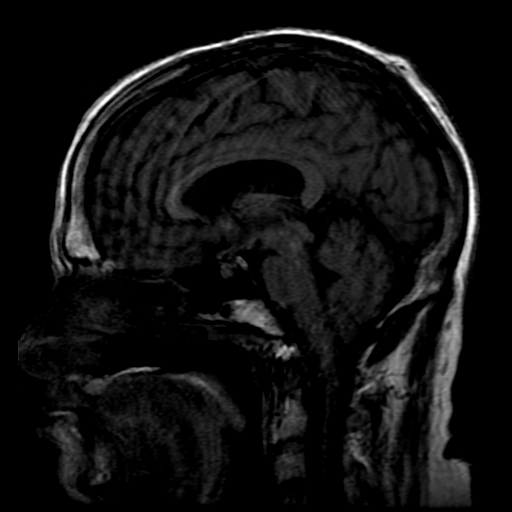
[im 19/19]
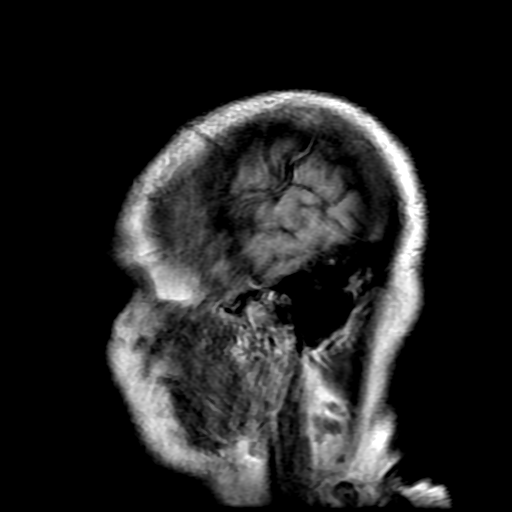

[Series 5: DWI · axial · 5.0mm · 1.09mm/px · z∈[-79,+66]mm · 9 of 60 slices shown (1 of 2)]
[im 1/60]
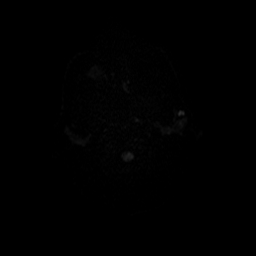
[im 8/60]
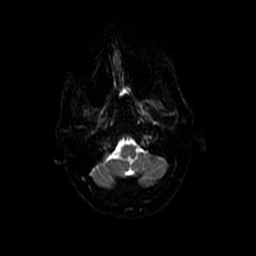
[im 15/60]
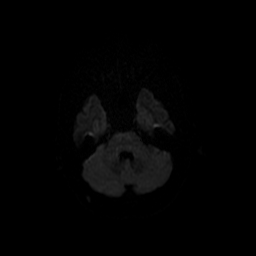
[im 23/60]
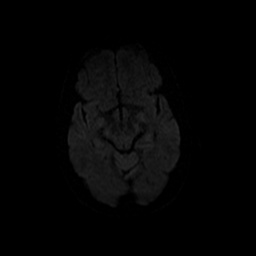
[im 30/60]
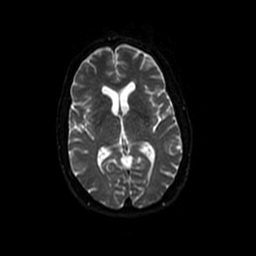
[im 37/60]
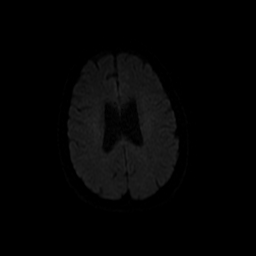
[im 45/60]
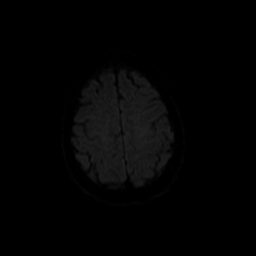
[im 52/60]
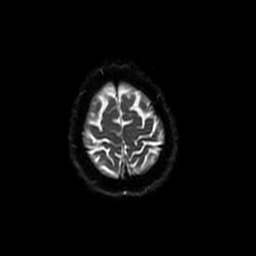
[im 60/60]
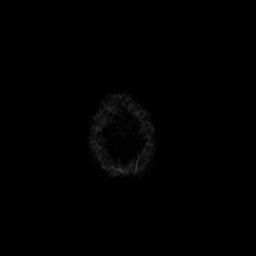

[Series 6: T2 · axial · 5.0mm · 0.43mm/px · z∈[-109,+34]mm · 3 of 22 slices shown]
[im 1/22]
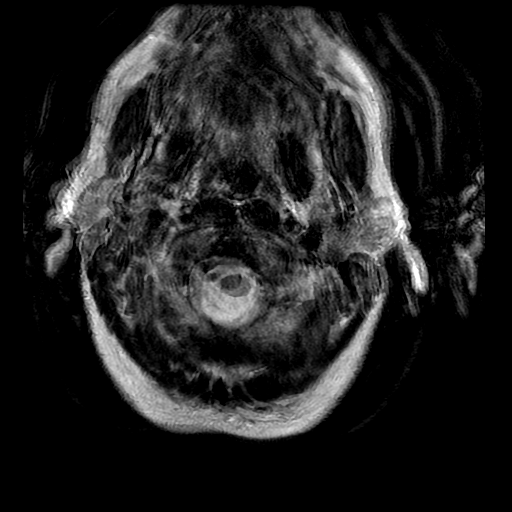
[im 11/22]
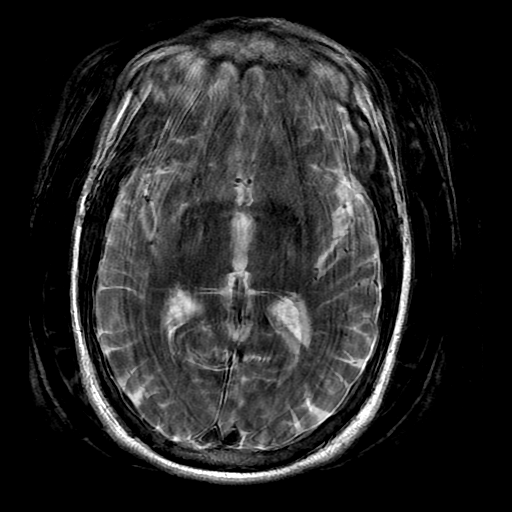
[im 22/22]
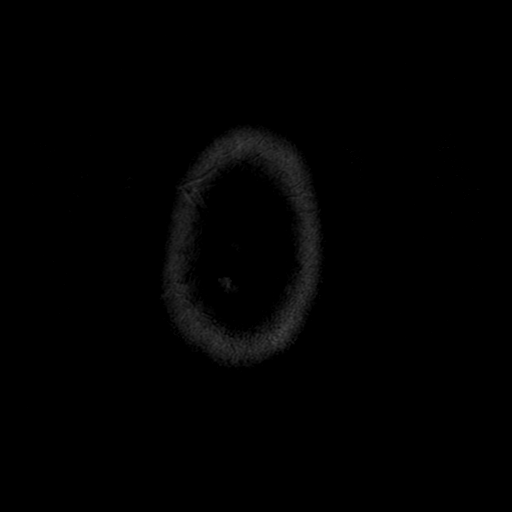

[Series 7: FLAIR · axial · 5.0mm · 0.43mm/px · z∈[-110,+35]mm · 4 of 26 slices shown]
[im 1/26]
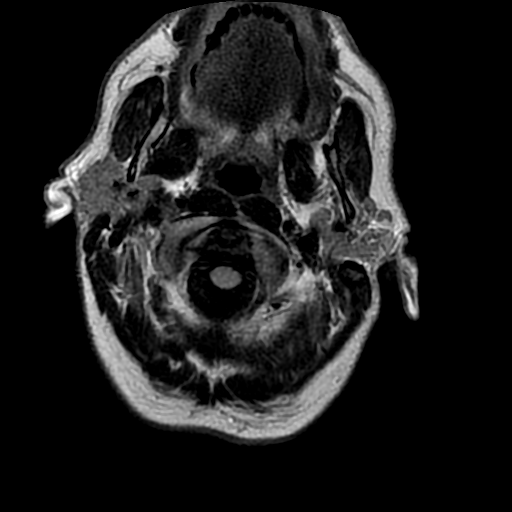
[im 9/26]
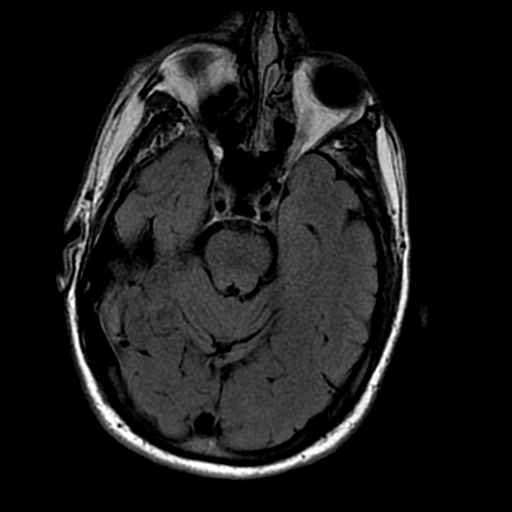
[im 17/26]
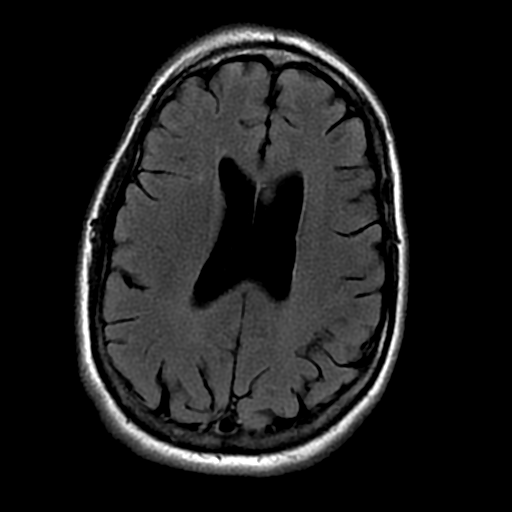
[im 26/26]
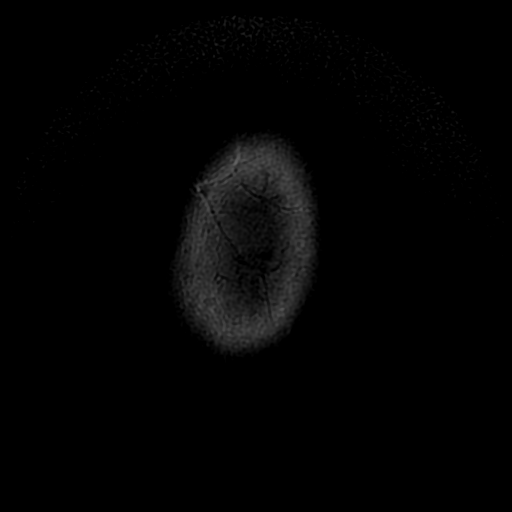

[Series 500: DWI · axial · 5.0mm · 1.09mm/px · z∈[-79,+66]mm · 5 of 30 slices shown (2 of 2)]
[im 1/30]
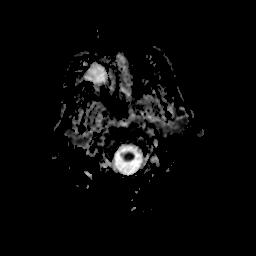
[im 8/30]
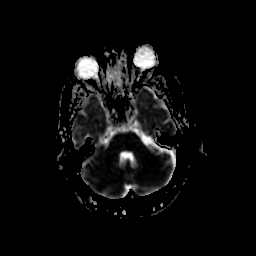
[im 15/30]
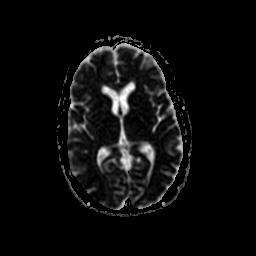
[im 22/30]
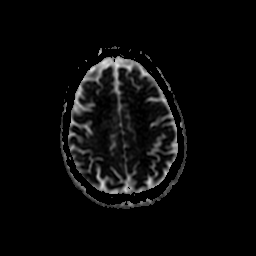
[im 30/30]
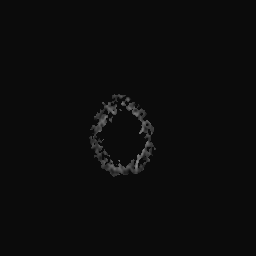

[28 of 48 positions shown; findings below may reference images not displayed]

FINDINGS: MRI HEAD FINDINGS

Study is somewhat degraded by motion artifact.

A few scattered foci of hyperintense T2/FLAIR signal are seen within
the periventricular and deep white matter, nonspecific, but likely
related chronic small vessel ischemic changes. No mass lesion or
midline shift identified. Ventricles are normal in size without
evidence of hydrocephalus. There is no extra-axial fluid collection.

No diffusion-weighted signal abnormality is identified to suggest
acute intracranial infarct. No intracranial hemorrhage seen on
gradient echo sequence.

The craniocervical junction is within normal limits. Evaluation
pituitary gland is somewhat limited due to motion artifact. No
significant atrophy identified. Orbits, optic nerves, and optic
chiasm demonstrate a normal appearance.

Calvarium is intact with normal signal intensity. Scalp soft tissues
are within normal limits.

Polypoid opacity is noted within the right maxillary sinus.
Paranasal sinuses are otherwise clear. No mastoid effusion.

MRA HEAD FINDINGS

Study is somewhat degraded by motion artifact.

Visualized portions of the internal carotid arteries are widely
patent with antegrade flow. The A1 segments, anterior communicating
artery, anterior cerebral arteries, and middle cerebral arteries are
widely patent without evidence of high-grade stenosis. No
intracranial aneurysm identified within the anterior circulation.
Posterior communicating arteries are within normal limits.

Vertebral arteries are widely patent. Posterior inferior cerebellar
arteries are normal. The basilar artery is widely patent without
evidence of high-grade stenosis. No basilar tip aneurysm. Superior
cerebellar arteries, posterior cerebral arteries are within normal
limits. Fetal origin of the left PCA is noted. No high-grade
stenosis or intracranial aneurysm identified within the posterior
circulation.
IMPRESSION: MRI HEAD:

1. Normal MRI of the head without evidence of acute intracranial
infarct or other abnormality.
2. Right maxillary sinus disease.
MRA HEAD:

1. Normal MRA of the head without evidence of high-grade
flow-limiting stenosis or intracranial aneurysm.

## 2014-10-08 IMAGING — CR DG CHEST 2V
1 series · 2 of 2 positions shown · non-contrast
Comparison: 10/16/2013.

CLINICAL DATA: Shortness of breath.

EXAM:
CHEST  2 VIEW

[Series 1: w chest pa · 0.14mm/px · 2 of 2 slices shown]
[im 1/2]
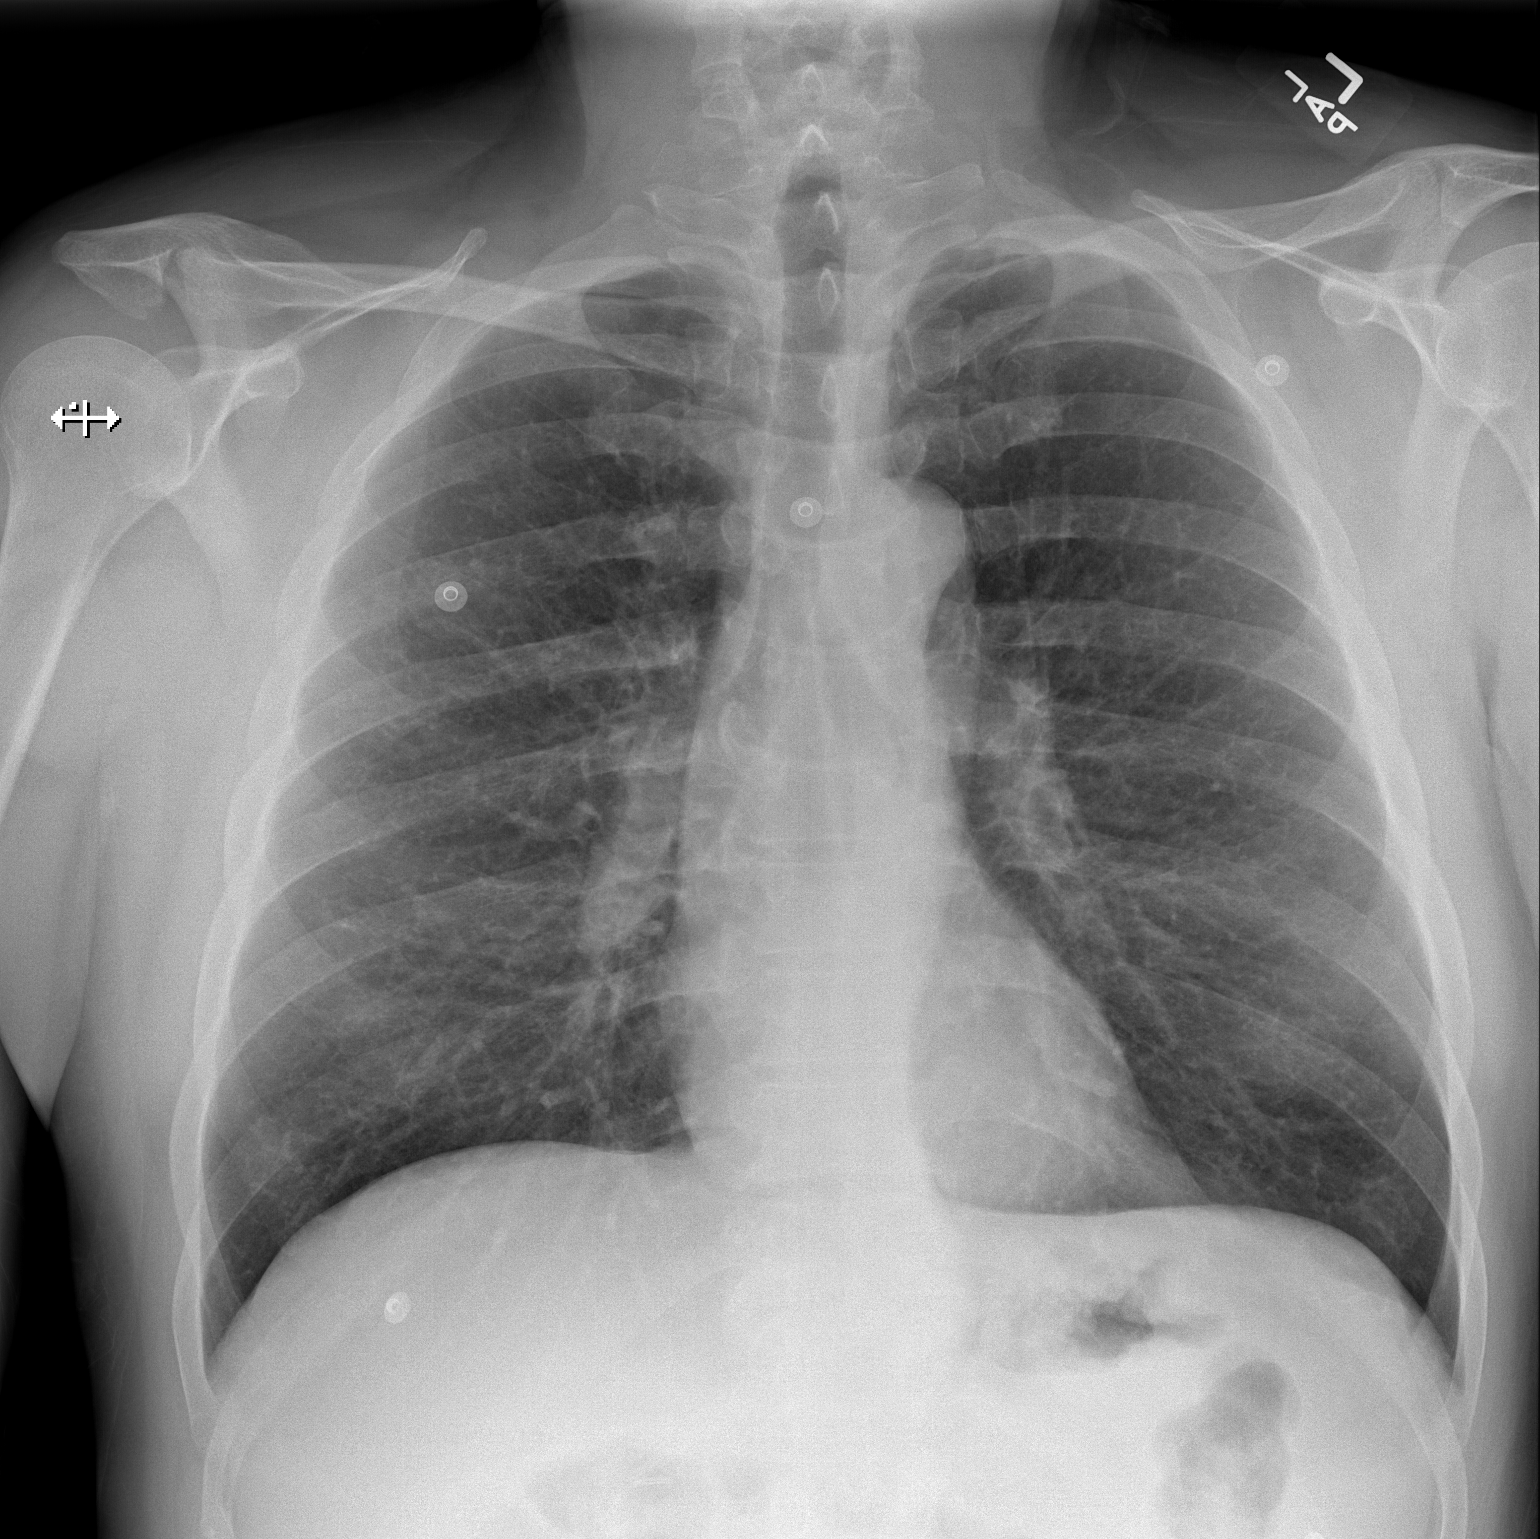
[im 2/2]
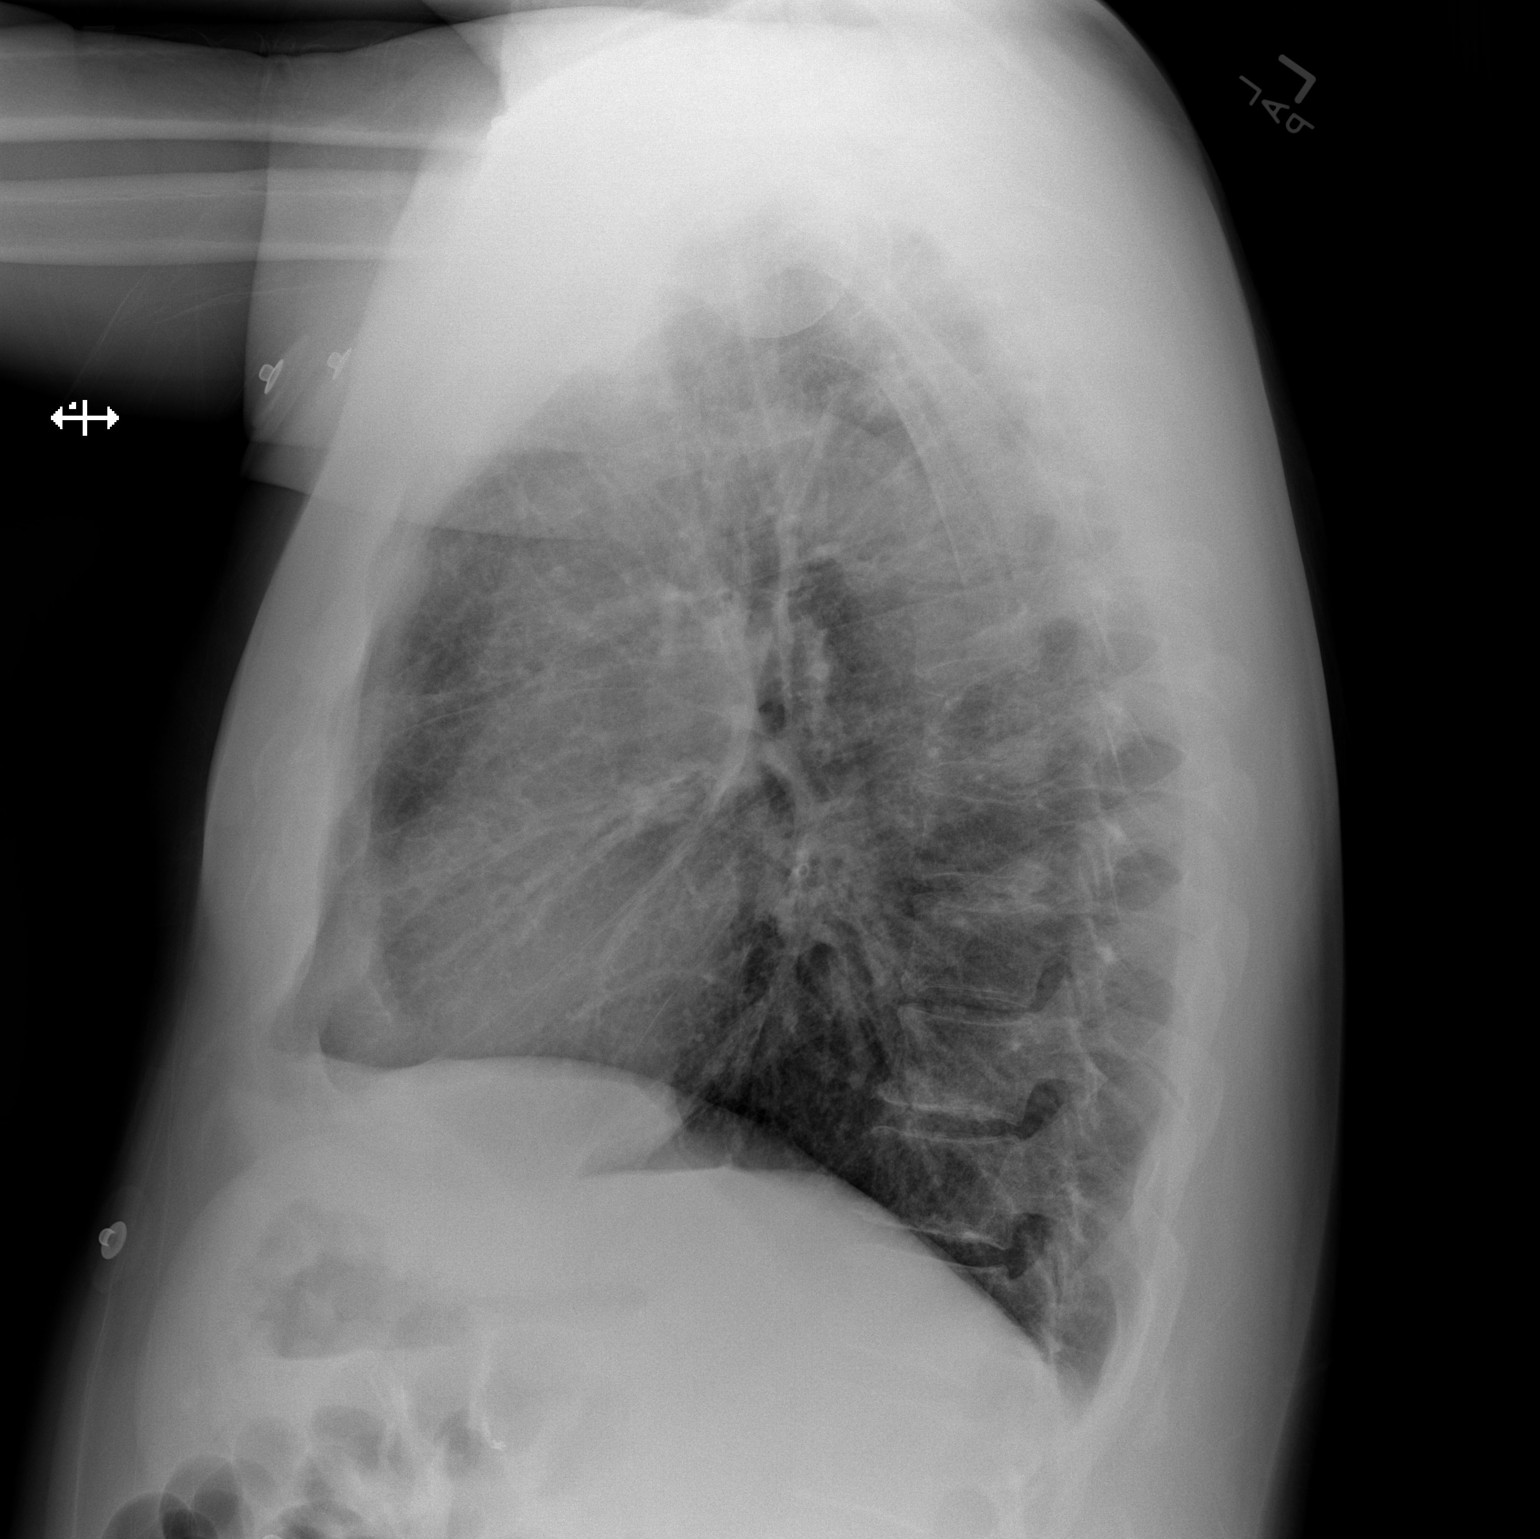

[2 of 2 positions shown; findings below may reference images not displayed]

FINDINGS: The heart size and mediastinal contours are within normal limits.
Both lungs are clear. The visualized skeletal structures are
unremarkable.
IMPRESSION: No active cardiopulmonary disease.

## 2014-12-02 ENCOUNTER — Emergency Department: Payer: Self-pay | Admitting: Emergency Medicine

## 2014-12-02 LAB — CBC
HCT: 53.9 % — ABNORMAL HIGH (ref 40.0–52.0)
HGB: 18.2 g/dL — AB (ref 13.0–18.0)
MCH: 30.3 pg (ref 26.0–34.0)
MCHC: 33.8 g/dL (ref 32.0–36.0)
MCV: 90 fL (ref 80–100)
Platelet: 173 10*3/uL (ref 150–440)
RBC: 6.01 10*6/uL — ABNORMAL HIGH (ref 4.40–5.90)
RDW: 13.7 % (ref 11.5–14.5)
WBC: 9.9 10*3/uL (ref 3.8–10.6)

## 2014-12-02 LAB — BASIC METABOLIC PANEL
ANION GAP: 11 (ref 7–16)
BUN: 10 mg/dL (ref 7–18)
CHLORIDE: 105 mmol/L (ref 98–107)
Calcium, Total: 8.6 mg/dL (ref 8.5–10.1)
Co2: 23 mmol/L (ref 21–32)
Creatinine: 1 mg/dL (ref 0.60–1.30)
EGFR (African American): >60
Glucose: 106 mg/dL — ABNORMAL HIGH (ref 65–99)
OSMOLALITY: 277 (ref 275–301)
Potassium: 3.6 mmol/L (ref 3.5–5.1)
SODIUM: 139 mmol/L (ref 136–145)

## 2014-12-02 LAB — ETHANOL: ETHANOL LVL: 192 mg/dL

## 2014-12-02 IMAGING — CR DG CHEST 1V PORT
1 series · 1 of 1 positions shown · non-contrast
Comparison: 12/11/2013

CLINICAL DATA: Chest pain.  History of throat and prostate cancer.

EXAM:
PORTABLE CHEST - 1 VIEW

[ap]
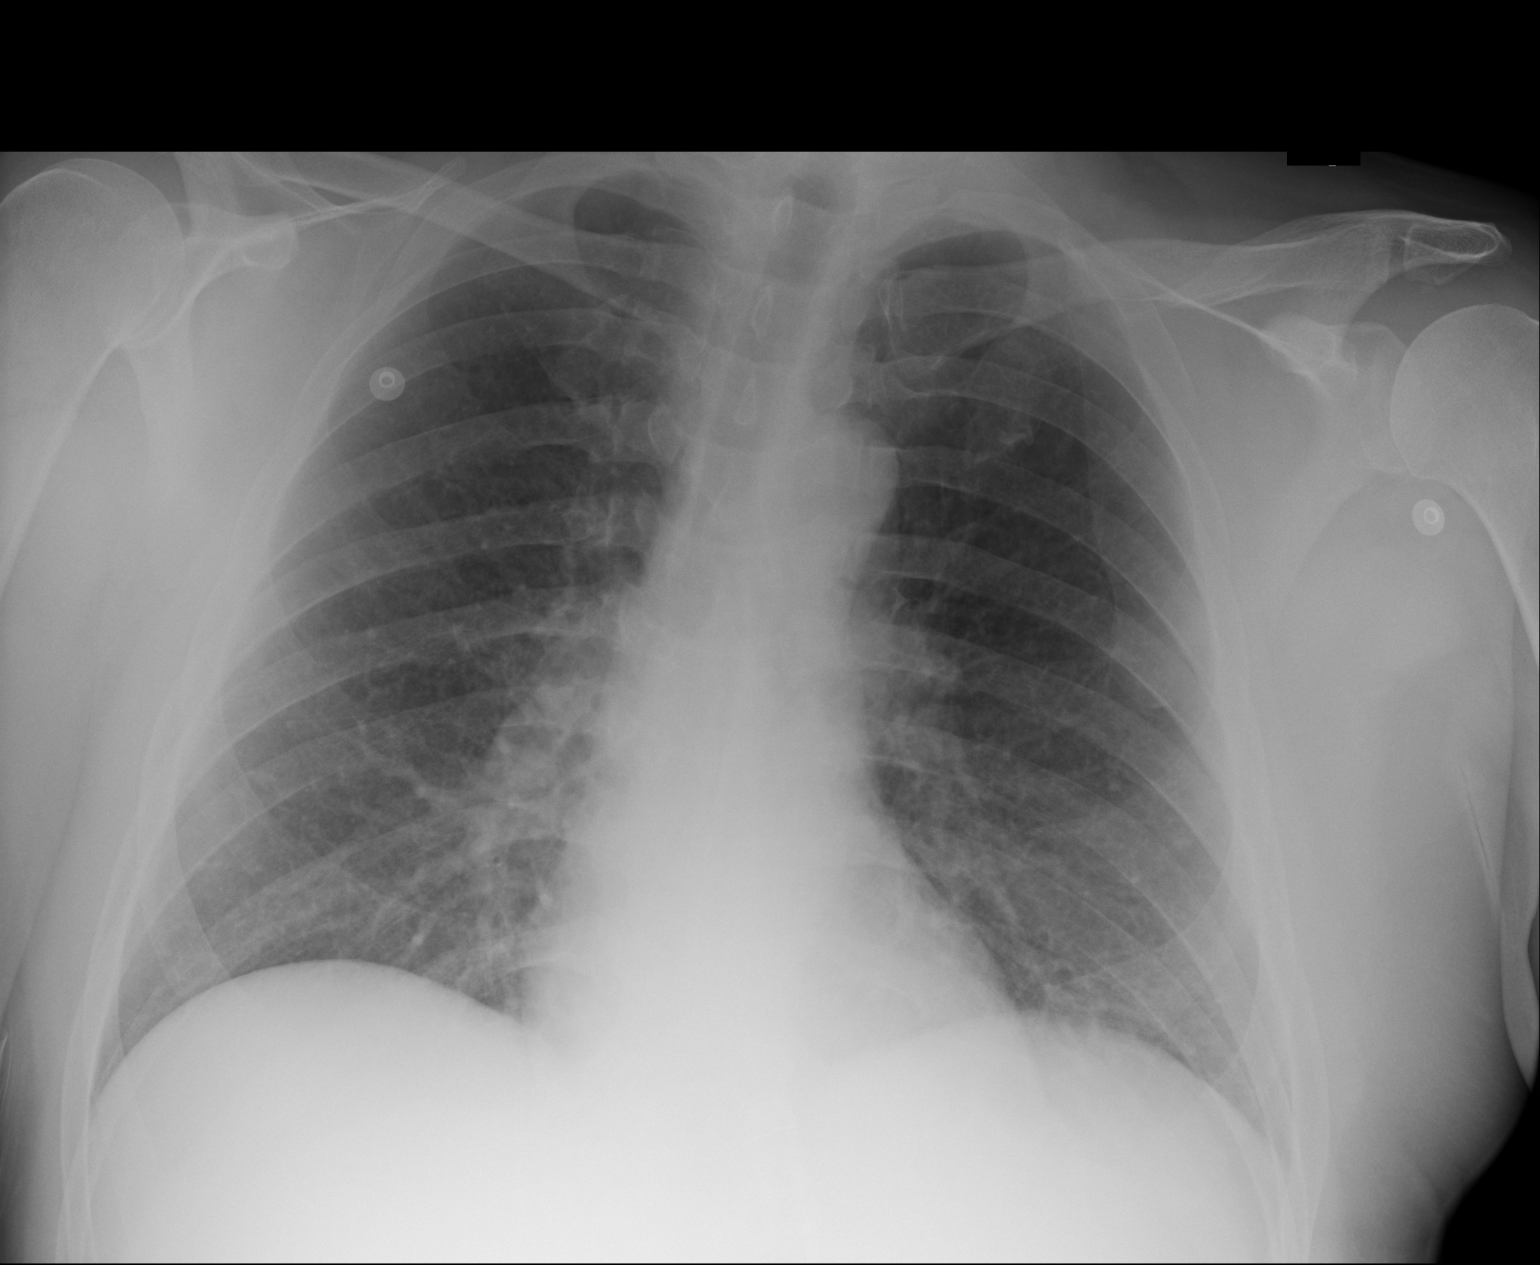

[1 of 1 positions shown; findings below may reference images not displayed]

FINDINGS: The cardiomediastinal silhouette is within normal limits. Lung
volumes are slightly shallower than on the prior study with slight
crowding of the pulmonary vasculature. There is no evidence of focal
airspace consolidation, edema, pleural effusion, or pneumothorax. No
acute osseous abnormality identified.
IMPRESSION: No evidence of acute cardiopulmonary abnormality.

## 2014-12-02 IMAGING — CT CT HEAD WITHOUT CONTRAST
2 series · 14 of 30 positions shown, 16 images · non-contrast
Comparison: Prior study from 09/05/2013.

CLINICAL DATA: Syncope

EXAM:
CT HEAD WITHOUT CONTRAST
TECHNIQUE: Contiguous axial images were obtained from the base of the skull
through the vertex without intravenous contrast.

[Series 2: head wo · axial · 0.42mm/px · z∈[-40,+60]mm · 6 of 30 slices shown, 8 images]
[im 5/30  brain]
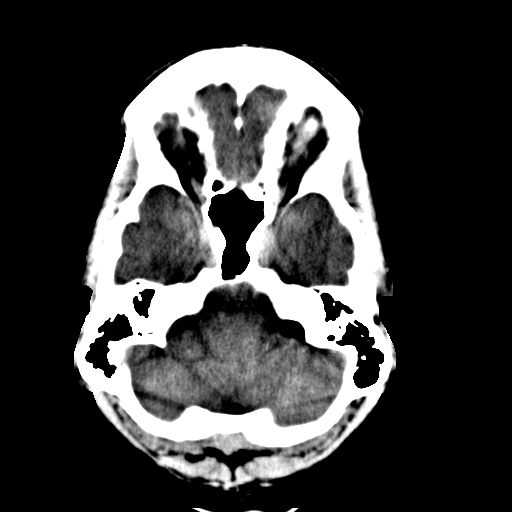
[im 5/30  bone]
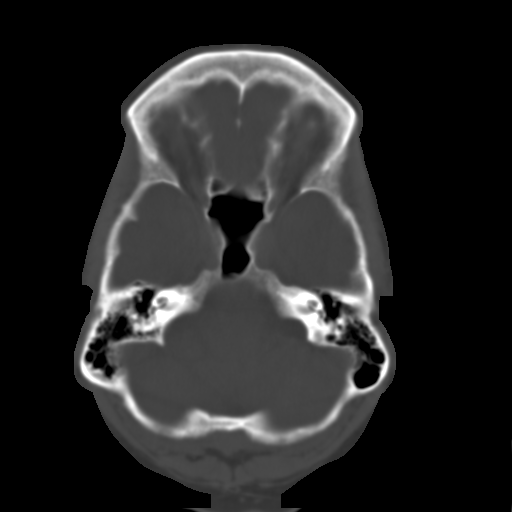
[im 9/30  brain]
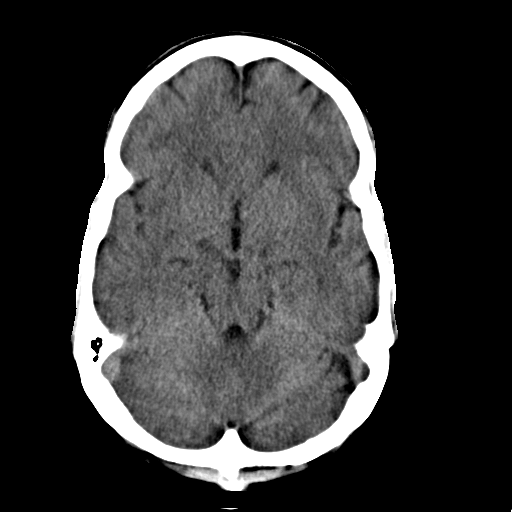
[im 13/30  brain]
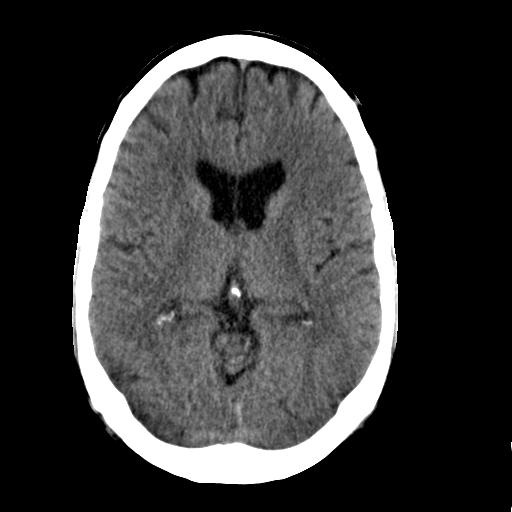
[im 17/30  brain]
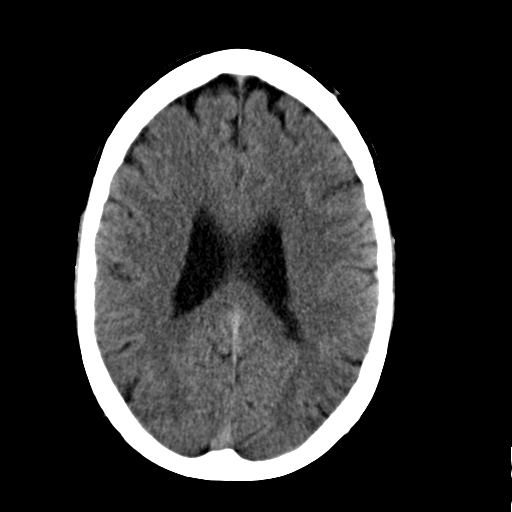
[im 21/30  brain]
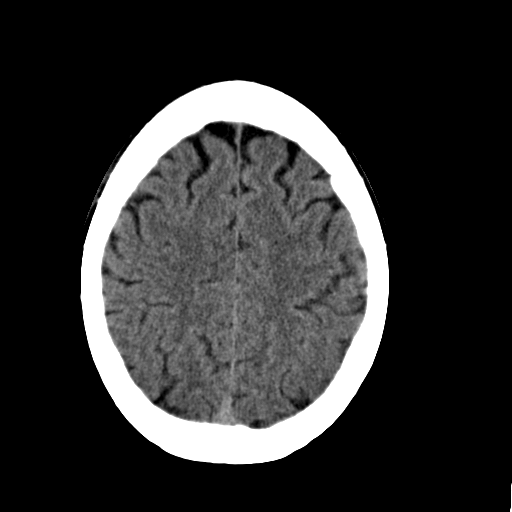
[im 21/30  bone]
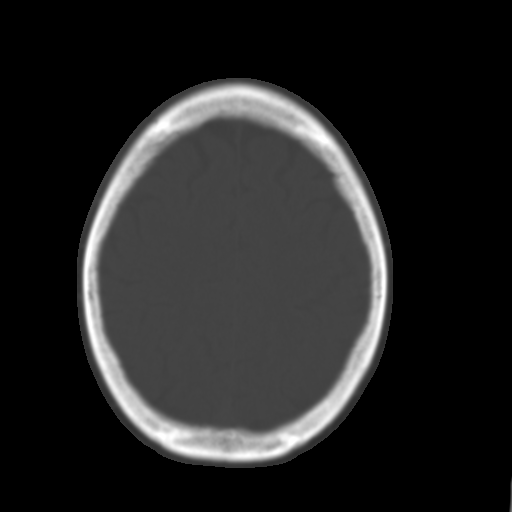
[im 25/30  brain]
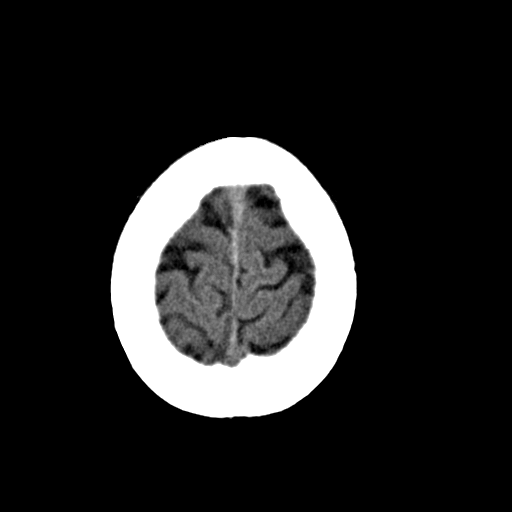

[Series 3: head bone · axial · 0.42mm/px · z∈[-52,+78]mm · 8 of 81 slices shown]
[im 8/81  bone]
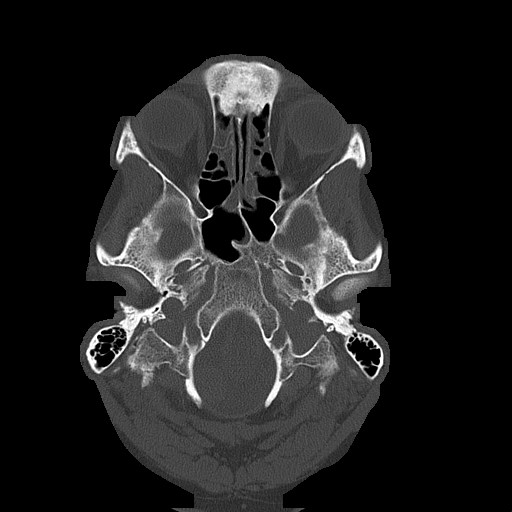
[im 16/81  bone]
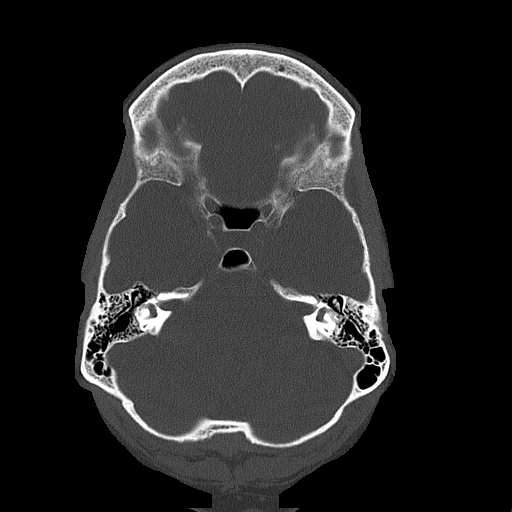
[im 27/81  bone]
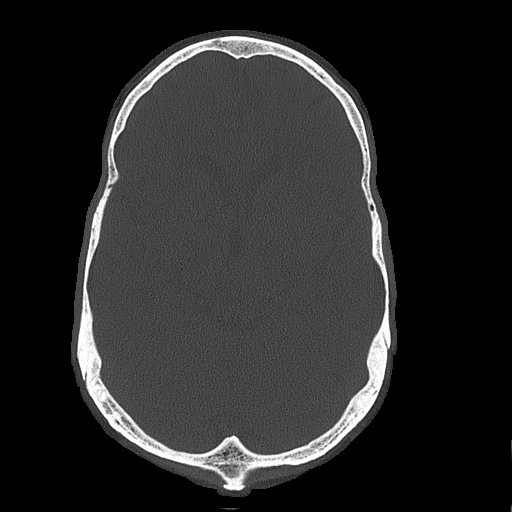
[im 35/81  bone]
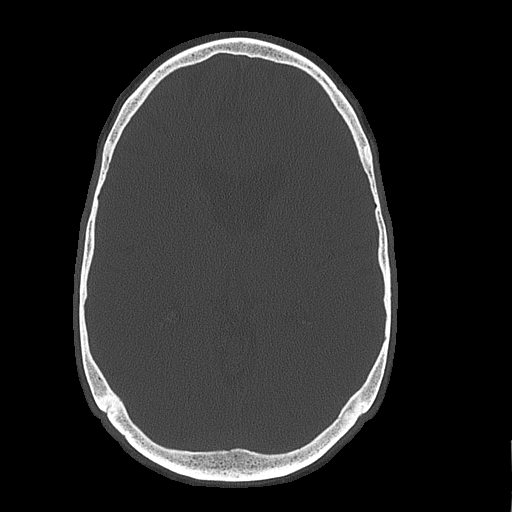
[im 46/81  bone]
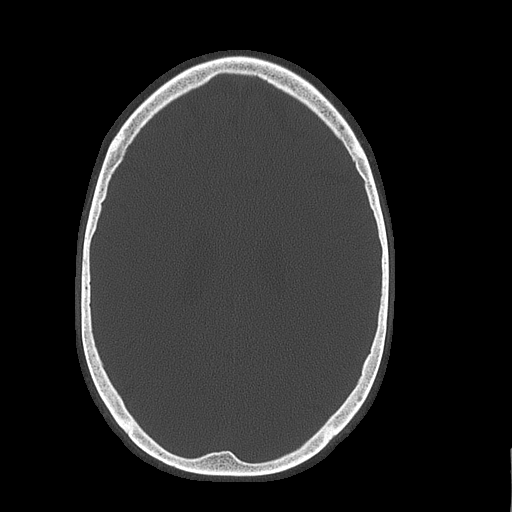
[im 54/81  bone]
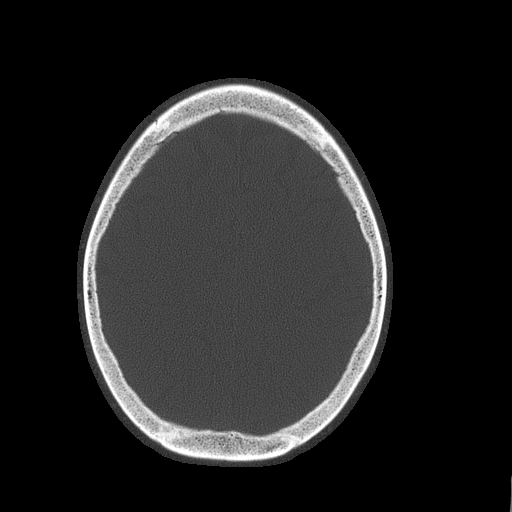
[im 65/81  bone]
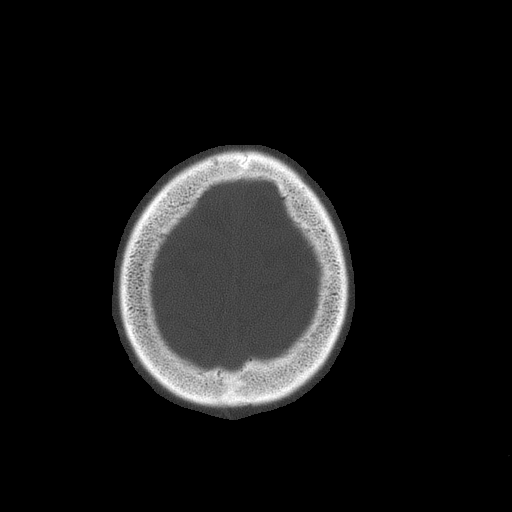
[im 73/81  bone]
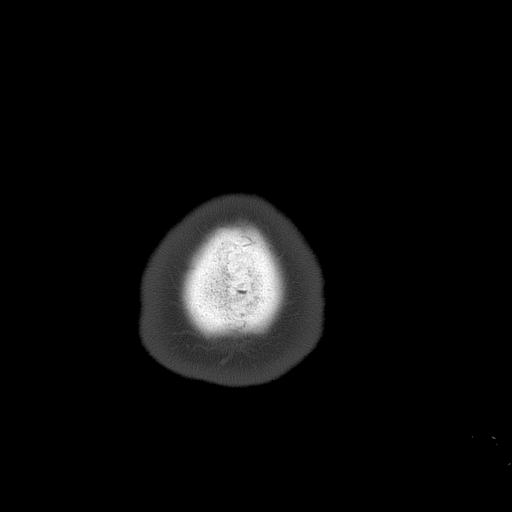

[14 of 30 positions shown; findings below may reference images not displayed]

FINDINGS: There is no acute intracranial hemorrhage or infarct. No mass lesion
or midline shift. Gray-white matter differentiation is well
maintained. Ventricles are normal in size without evidence of
hydrocephalus. CSF containing spaces are within normal limits. No
extra-axial fluid collection.

The calvarium is intact.

Orbital soft tissues are within normal limits.

Moderate mucoperiosteal thickening noted within the ethmoidal air
cells bilaterally. Paranasal sinuses are otherwise clear. No mastoid
effusion.

Scalp soft tissues are unremarkable.

## 2014-12-03 LAB — TROPONIN I: Troponin-I: <0.02 ng/mL

## 2014-12-06 IMAGING — US US EXTREM LOW VENOUS BILAT
1 series · 14 of 24 positions shown · non-contrast
Comparison: None.

CLINICAL DATA: Bilateral lower extremity pain

EXAM:
BILATERAL LOWER EXTREMITY VENOUS DUPLEX ULTRASOUND
TECHNIQUE: Gray-scale sonography with graded compression, as well as color
Doppler and duplex ultrasound, were performed to evaluate the deep
venous system from the level of the common femoral vein through the
popliteal and proximal calf veins. Spectral Doppler was utilized to
evaluate flow at rest and with distal augmentation maneuvers.

[Series 1: us extrem low venous bilat · 0.08mm/px · 14 of 71 slices shown]
[im 1/71]
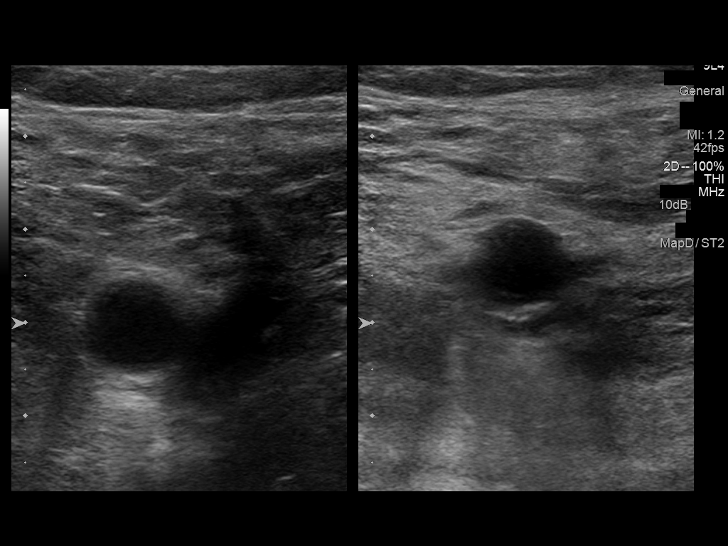
[im 7/71]
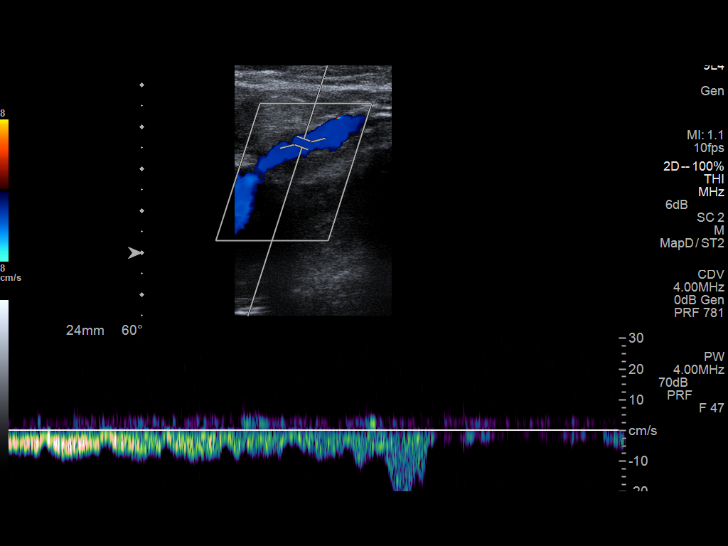
[im 13/71]
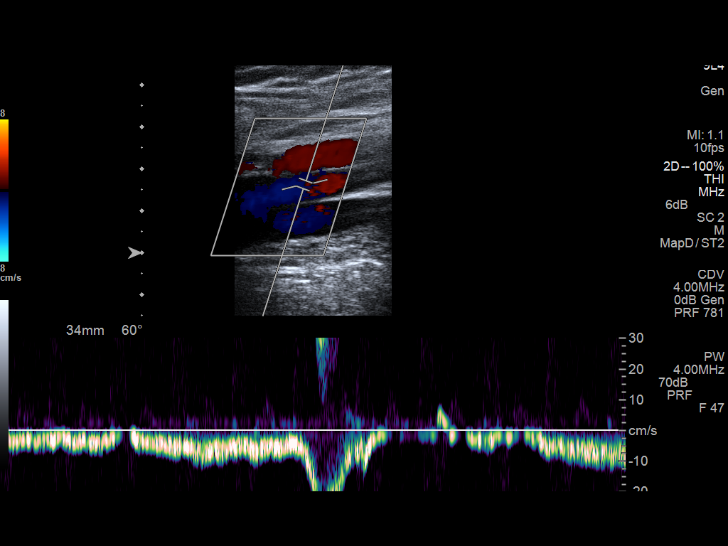
[im 19/71]
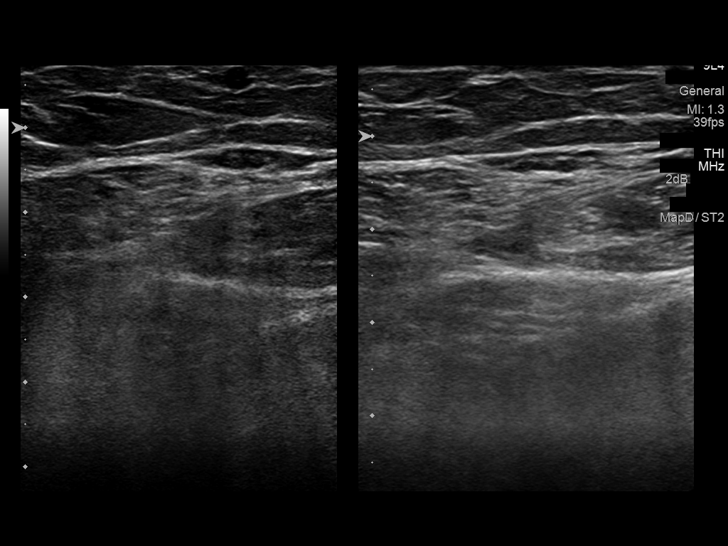
[im 22/71]
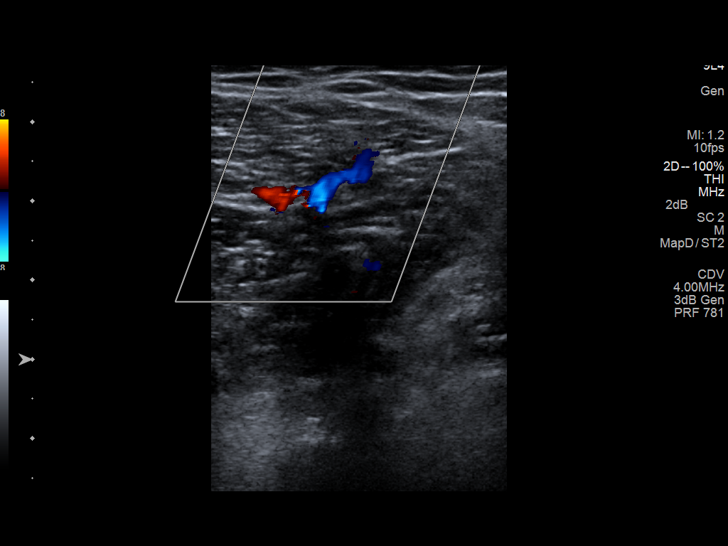
[im 28/71]
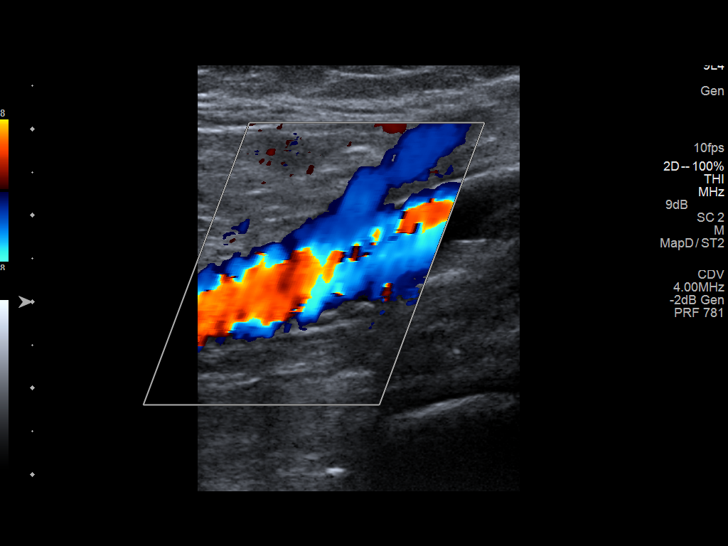
[im 34/71]
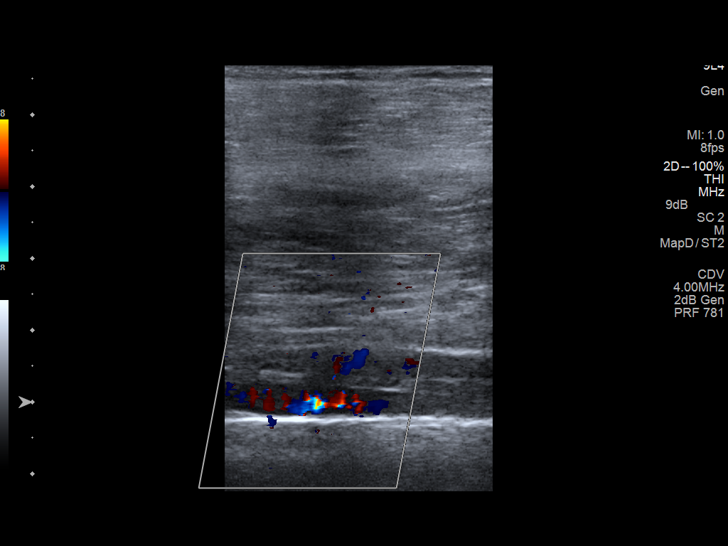
[im 37/71]
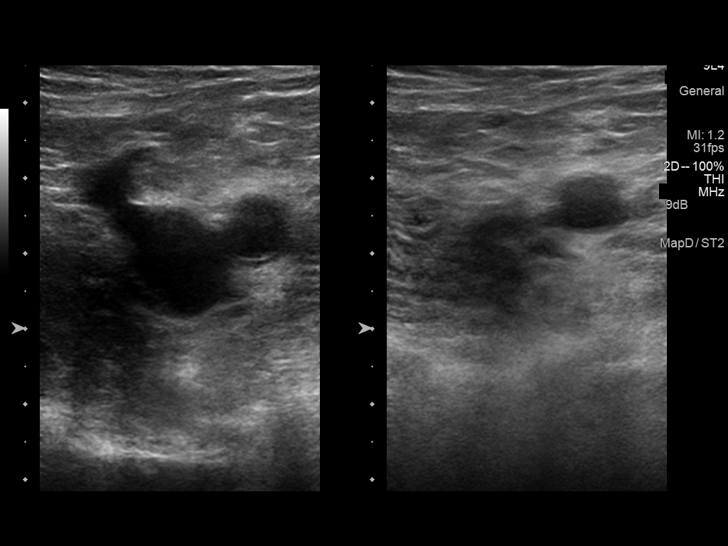
[im 43/71]
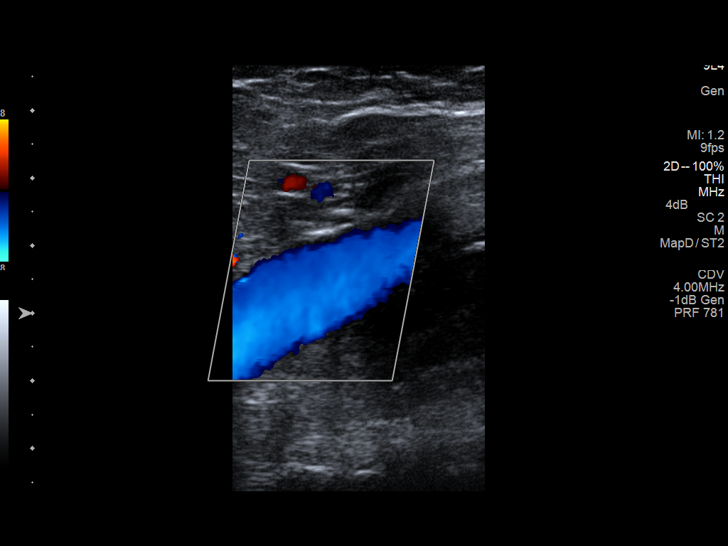
[im 49/71]
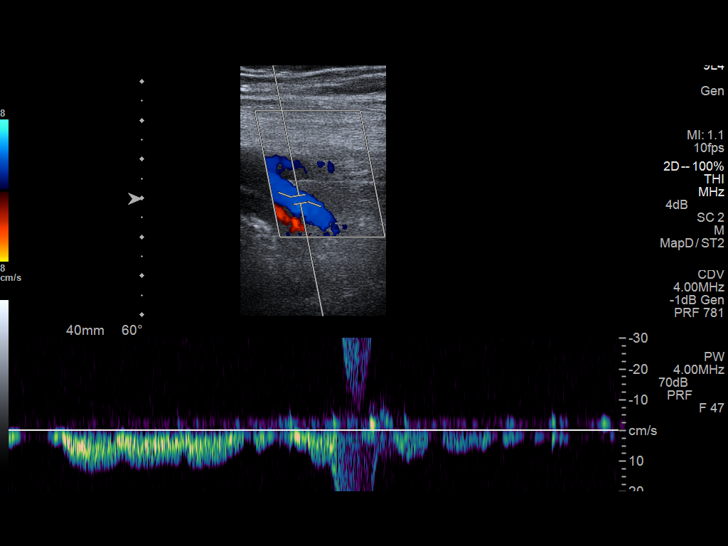
[im 55/71]
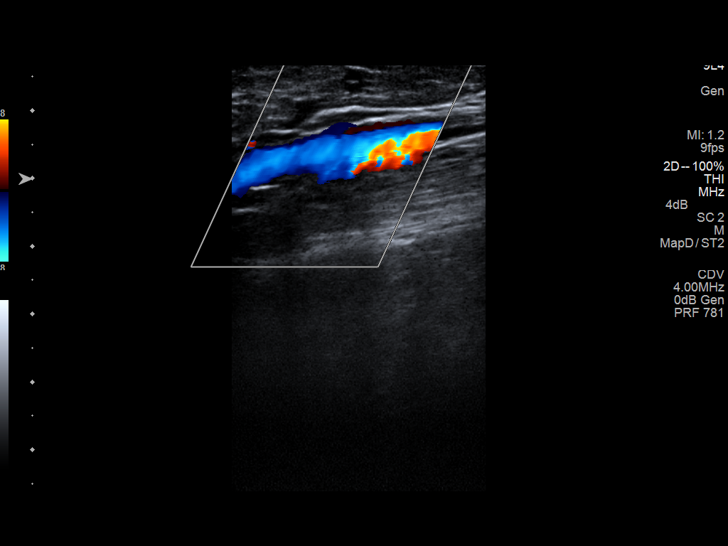
[im 58/71]
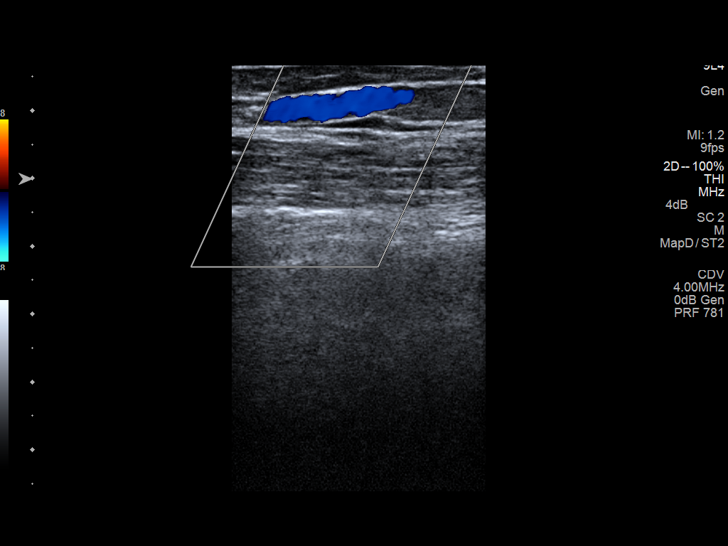
[im 64/71]
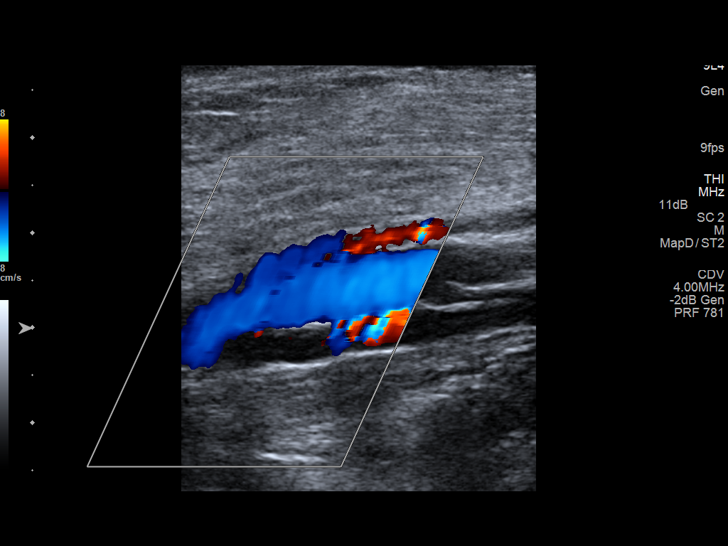
[im 71/71]
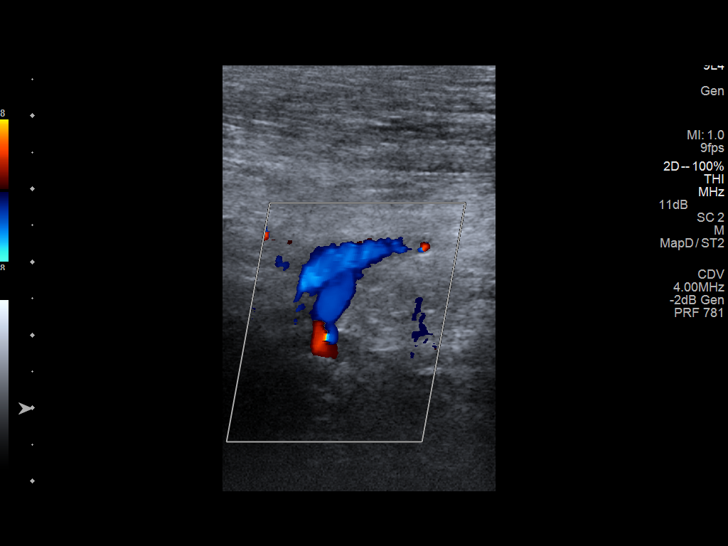

[14 of 24 positions shown; findings below may reference images not displayed]

FINDINGS: Flow in the venous structures of both lower extremities is
spontaneous and phasic in all segments. There is normal compression
and augmentation in the venous structures of both lower extremities.
Venous Doppler signal is normal in all regions bilaterally. There is
no thrombus in the deep or visualized superficial venous structures
on either side. There is no evidence of deep venous incompetence in
either lower extremity.
IMPRESSION: No evidence of deep venous thrombosis in either lower extremity.

## 2014-12-06 IMAGING — CR DG LUMBAR SPINE 2-3V
1 series · 3 of 3 positions shown · non-contrast
Comparison: Radiographs dated 04/19/2012 and the lumbar MRI dated
09/18/2012

CLINICAL DATA: Low back pain and bilateral lower extremity pain.

EXAM:
LUMBAR SPINE - 2-3 VIEW

[Series 1: t lumbar spine ap · 0.14mm/px · 3 of 3 slices shown]
[im 1/3]
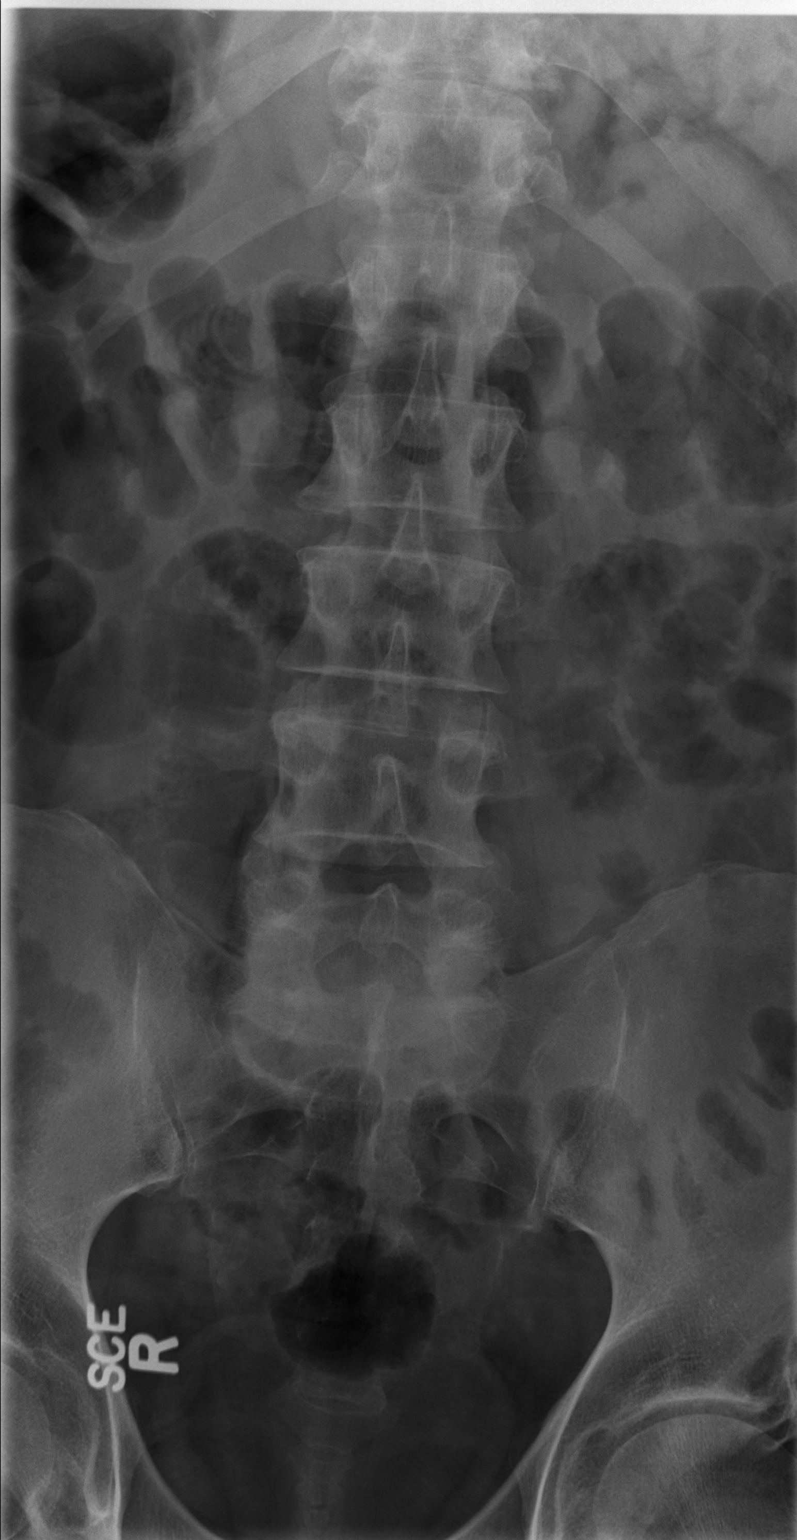
[im 2/3]
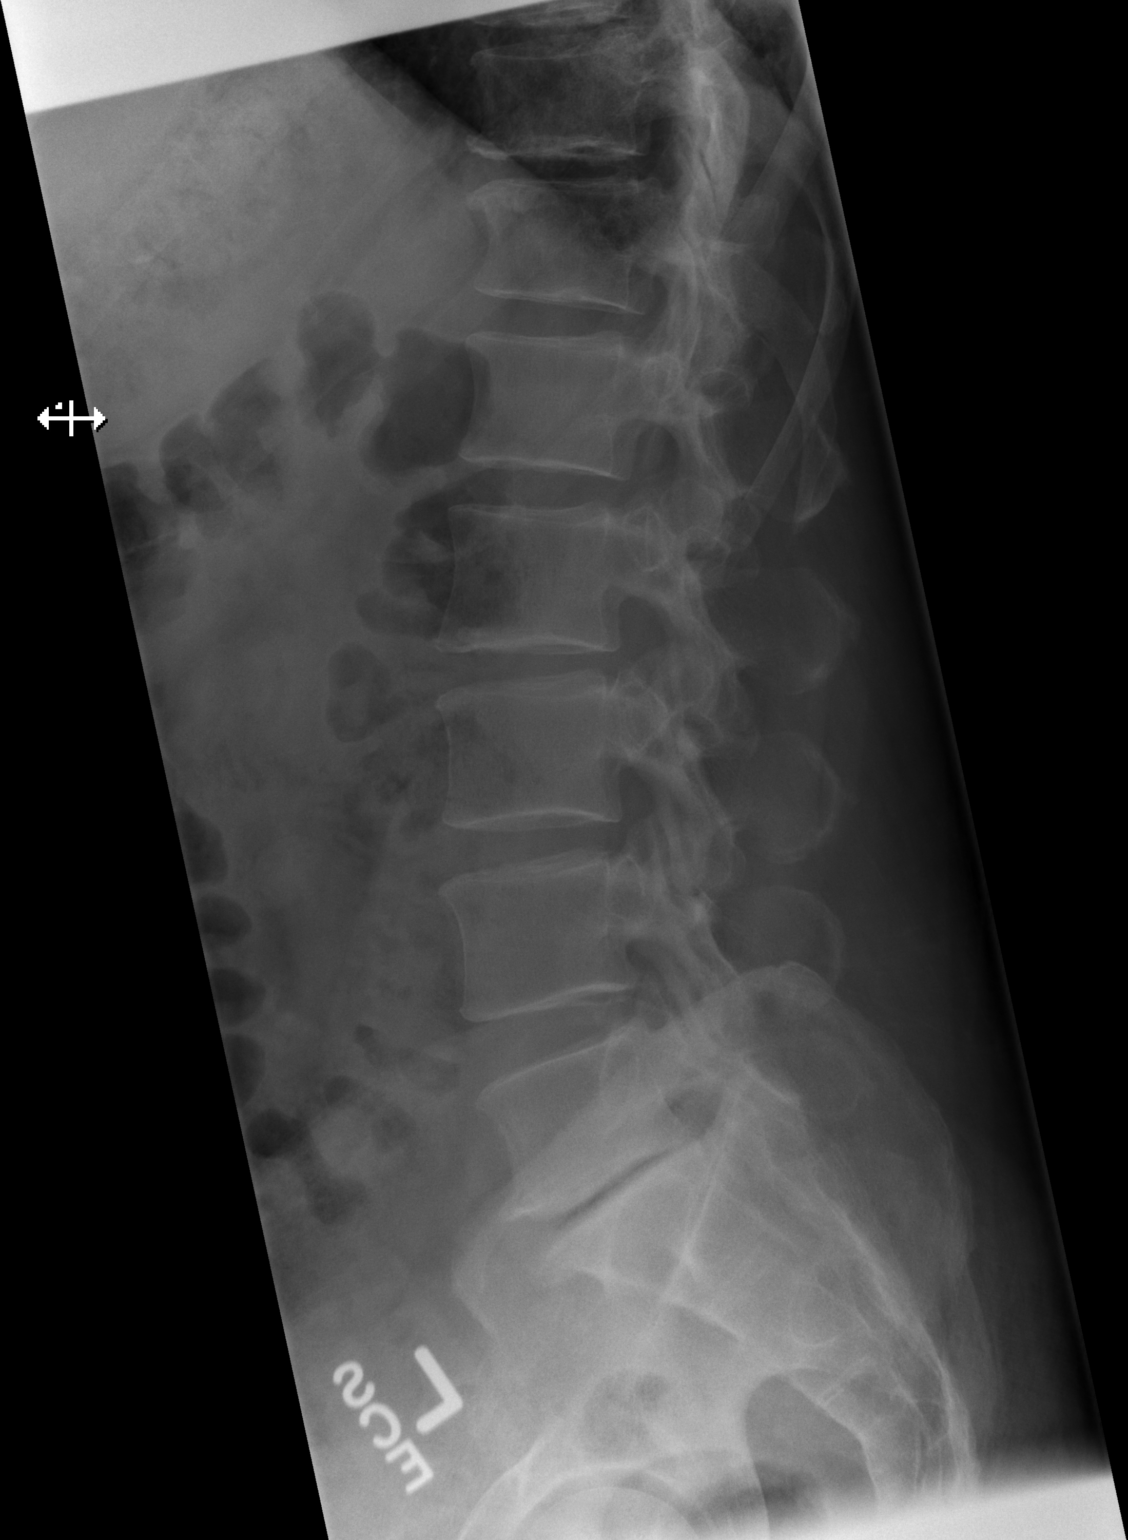
[im 3/3]
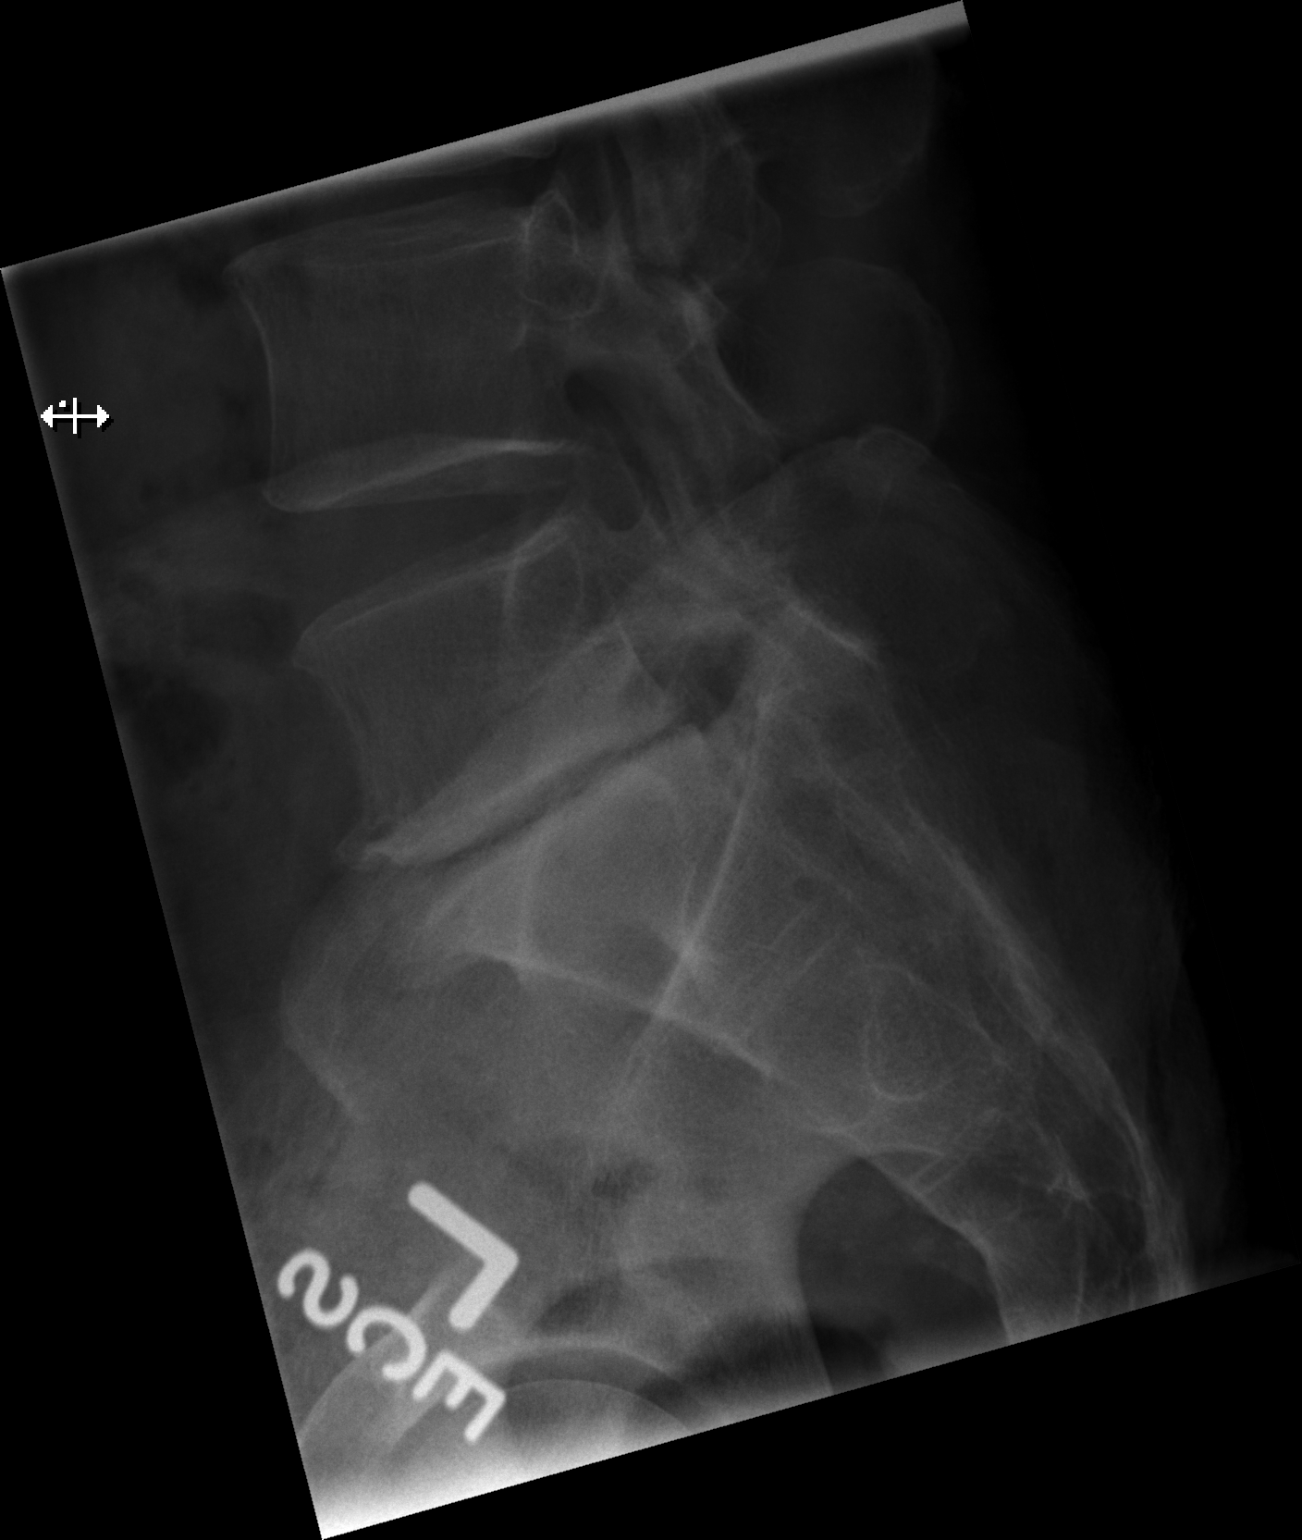

[3 of 3 positions shown; findings below may reference images not displayed]

FINDINGS: There is chronic narrowing of the L5-S1 disc space with degenerative
changes of the endplates. There is no spondylolisthesis. The other
disc spaces are well maintained. No appreciable facet arthritis. No
fracture or bone destruction.
IMPRESSION: Chronic degenerative disc disease at L5-S1.  No acute abnormality.

## 2015-02-24 ENCOUNTER — Inpatient Hospital Stay: Payer: Self-pay | Admitting: Psychiatry

## 2015-04-10 NOTE — Consult Note (Signed)
Chief Complaint:   Subjective/Chief Complaint patient states left upper extremity is getting better, left leg still difficult to move.   No nausea or vomiting, no abdominal pain, tolerating po.   VITAL SIGNS/ANCILLARY NOTES: **Vital Signs.:   26-Sep-13 11:06   Vital Signs Type Routine   Temperature Temperature (F) 97.8   Celsius 36.5   Temperature Source oral   Pulse Pulse 75   Respirations Respirations 18   Systolic BP Systolic BP 885   Diastolic BP (mmHg) Diastolic BP (mmHg) 79   Mean BP 100   Pulse Ox % Pulse Ox % 95   Pulse Ox Activity Level  At rest   Oxygen Delivery Room Air/ 21 %   Brief Assessment:   Cardiac Regular    Respiratory clear BS    Gastrointestinal details normal Soft  Nontender  Nondistended  No masses palpable   Lab Results:  Thyroid:  26-Sep-13 03:57    Thyroxine, Free 0.97 (Result(s) reported on 16 Sep 2012 at 05:20AM.)   Assessment/Plan:  Assessment/Plan:   Assessment 1) esophageal cancer-patient with intermittant emesis of small amounts of blood, not repeated since admission, heme negative on admission, hemodynamically stable, not anemic.  Again discussed with patient follow up at [facility] where full evaluation was done and surgical arrangements were previously made. He again expressed he does wish to do that.    Plan 1) continue bid ppi, will follow. awaiting disposition re left sided weakness.   Electronic Signatures: Loistine Simas (MD)  (Signed 26-Sep-13 16:24)  Authored: Chief Complaint, VITAL SIGNS/ANCILLARY NOTES, Brief Assessment, Lab Results, Assessment/Plan   Last Updated: 26-Sep-13 16:24 by Loistine Simas (MD)

## 2015-04-10 NOTE — Discharge Summary (Signed)
PATIENT NAME:  Ian [name], Ian [name] MR#:  [id number] DATE OF BIRTH:  Jan 23, 1961  DATE OF ADMISSION:  09/14/2012 DATE OF DISCHARGE:  09/22/2012  ADDENDUM  PRIMARY CARE PHYSICIAN: Nonlocal   This is an addendum. For a detailed discharge summary, please refer to the interim discharge summary dictated by Dr. Verdell Carmine yesterday.   FINAL DIAGNOSES: 1. 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  2. History of alcohol abuse with hematemesis, resolved.  3. Polysubstance abuse.  4. Anxiety.   CONDITION: Stable.   HOME MEDICATIONS:  1. Trazodone 150 mg p.o. 2 tablets once a day at bedtime.  2. Hydroxyzine pamoate 25 mg p.o. 4 times a day. 3. Escitalopram  20 mg p.o. once daily.  4. Quetiapine 50 mg p.o. t.i.d.  5. Cyclobenzaprine 10 mg p.o. q.8 hours p.r.n. for muscle spasms.  6. Nicotine patch 21 mg per 24 hours transdermal film one patch transdermal once a day.  7. Multivitamin one cap once a day.  8. Folic acid 1 mg once a day.  9. Thiamine 100 mg p.o. once a day.   STOP MEDICATION: Citalopram once daily.   DIET: Regular diet.   ACTIVITY: As tolerated.   FOLLOW-UP CARE: Follow up with PCP within 1 to 2 weeks. Also continue physical therapy and follow up [facility] psychiatry as outpatient.   ____________________________ Demetrios Loll, MD qc:cms D: 09/22/2012 10:05:40 ET T: 09/22/2012 10:24:33 ET JOB#: [id number]  cc: Demetrios Loll, MD, <Dictator> Demetrios Loll MD ELECTRONICALLY SIGNED 09/22/2012 15:18

## 2015-04-10 NOTE — Consult Note (Signed)
PATIENT NAME:  Ian [name], Ian [name] MR#:  [id number] DATE OF BIRTH:  [birth year]  DATE OF CONSULTATION:  09/15/2012  REFERRING PHYSICIAN:   CONSULTING PHYSICIAN:  Theodore Demark, NP  HISTORY OF PRESENT ILLNESS: Appreciate consult for a 54 year old Caucasian man with history of polysubstance abuse including alcohol and cocaine, hypertension, angina, transient ischemic attack, prostate cancer, and chronic obstructive pulmonary disease for evaluation of hematemesis. He was recently diagnosed with esophageal cancer at [facility] and underwent EGD on 06/03/2012 by Dr. Prescott Parma  and had EUS there. Due to extent of the cancer, It was arranged for him to have an esophageal resection by Dr. Tor Netters at [facility] a thoracic surgeon, over the summer. The patient states that he got overwhelmed and decided to cancel the procedure despite discussions with his care team at [facility]. He was last admitted here also in June of 2013 with hematemesis that resolved and was evaluated by psych for suicidal ideation. He received psych care on an outpatient basis, but not recently. Unfortunately he has returned to drinking alcohol and taking intranasal cocaine and was admitted here yesterday after binge the day prior with CVA-type symptoms. See admit history and physical. He states these are improving today. Regarding hematemesis, he states he has nausea and vomits two to three times a week. It looks reddish and stringy. He does report some acid reflux, generalized abdominal pain, and occasional dysphagia, but states that he eats well. He states bowels are moving regularly, formed and green in color. Weight fluctuates up and down. He was on Nexium at one time but does not appear to be taking this lately. No further GI complaints but does report an increase in cough, wheeze, and sweating. He had colonoscopy in June 2013 with an adenoma without high-grade dysplasia or malignancy.   PAST MEDICAL HISTORY:  1. Alcohol abuse. 2. Hypertension. 3. Cocaine  use. 4. Angina.  5. Transient ischemic attack. 6. Esophageal cancer. 7. Tobacco abuse. 8. Anxiety and depression. 9. Prostate cancer status post seed implantation.    PAST SURGICAL HISTORY:  1. Appendectomy.  2. Cholecystectomy.   ALLERGIES: No known drug allergies.   MEDICATIONS:  1. Citalopram 40 mg p.o. daily.  2. Hydroxyzine 25 mg p.o. p.r.n. 3. Trazodone 150 mg 2 tabs p.o. at bedtime.   SOCIAL HISTORY: Has continued with alcohol and cocaine use, however he states he has reduced this as compared to prior in his life. He lives alone. He states his sister does help him with his care needs.   FAMILY HISTORY: Pertinent for leukemia, liver cancer, and metastatic breast CA.   REVIEW OF SYSTEMS: CONSTITUTIONAL: Positive for intermittent chills, sweats, and weakness. States his weight fluctuates up and down, but is stable for the most part. EYES: Has had some visual changes with his recent admission. States these are improving. ENT: No sore throat or hoarseness. Has had some intermittent runny nose and congestion. CARDIOVASCULAR: Intermittent chest pain. Was going to have heart cardiac work-up at [facility] at one time. It does not appear that this took place. No current chest pain, palpitations, history of arrhythmias. RESPIRATORY: Intermittent shortness of breath, cough, wheeze. GI: As noted above. GENITOURINARY: No GU complaints of hematuria, polyuria, or difficulty passing urine. MUSCULOSKELETAL: States his left-sided weakness is improving. Has had increased use of the left upper extremity. Does report some tingling in the left foot, but the left leg continues with some weakness. NEUROLOGIC: Has had recent memory loss after alcohol and cocaine binge with presentation to emergency department and subsequent  admission. No history of seizures. He is currently being evaluated for stroke and has had MRI of the brain today. Feels like his admit symptoms are improving. ENDOCRINE: No history of diabetes or  thyroid problems. PSYCH: Pertinent for depression and anxiety, as noted above.   LABORATORY, DIAGNOSTIC AND RADIOLOGIC DATA: Most recent lab work: Glucose 55, BUN 11, creatinine 0.9, sodium 139, potassium 4.4, GFR greater than 60, calcium 8.2, cholesterol 135, triglycerides 167, and HDL 35. Admission ethanol was 0.157%. Total serum protein 7.2, albumin 4.0, bilirubin 0.3, alkaline phosphatase 69, AST 32, and ALT 49. Cardiac enzyme testing has been negative x3. TSH 0.356. Urine drug screen was positive for cocaine. WBC 10.3, hemoglobin x3 has been subsequently 17.3, 16.3, and 16.9, hematocrit 47.6, platelet count 157, and red cells are normocytic with a normal distribution. PT 13.3. INR 1.   Urinalysis is negative for infection, ketones, and blood.  PHYSICAL EXAMINATION:   VITALS: Most recent vital signs - temperature 98.6, pulse 71, respiratory rate 20, blood pressure 124/86, and oxygen saturation 92%.   GENERAL: A well-appearing Caucasian man resting comfortably in bed.   PSYCH: Mild anxiety, intermittent. Pleasant and cooperative. Good memory skills. Logical train of thought.   HEENT: Normocephalic, atraumatic. No redness, drainage, or inflammation to the eyes or the nares. Oral mucous membranes are pink and moist.   NECK: No JVD, lymphadenopathy, or thyromegaly. It is supple.   RESPIRATORY: Respirations eupneic. Lungs CTAB.   CARDIOVASCULAR: S1 and S2. Regular rate and rhythm. No MRG. Peripheral pulses 2+ x4. No appreciable edema.   ABDOMEN: Nondistended. Bowel sounds x4. Soft. Minimal diffuse tenderness. No hepatosplenomegaly, masses, peritoneal signs, rebound tenderness, or guarding.   GENITOURINARY: Foley present, patent, draining yellow clear urine.   RECTAL: No abnormalities. Stool brownish - heme negative.   MUSCULOSKELETAL: Good use of upper extremities. Strength 5/5 strength. Strength also 5/5 to right lower extremity. Strength 1/5 to left lower extremity. No clubbing, edema,  or cyanosis.   SKIN: A couple of skin tears on the right arm, healing up, warm, dry, and pink. No other erythema, lesion, or rash.   NEUROLOGIC: Alert and oriented x3. Cranial nerves II through XII appear intact. Speech clear. No facial droop.   ASSESSMENT AND PLAN: Esophageal cancer. Get pathology report from [facility]. Discussed with the patient the aggressiveness of this type of cancer and that there are patients that do well after surgery. He states he may be willing to consider this. This may also be contributing to his hematemesis, but with pulmonary complaints we will assess CT of chest. May reconsider EGD later with minimal abdominal pain and hemoglobin is 16 to 17 and is heme negative on examination. Recommend twice a day IV PPI instead of current octreotide and pantoprazole drip. He may require referral back to [facility]. We will follow with you. I also did recommend stopping substances.   These services were provided by Stephens November, MSN, [facility] in collaboration with Dr. Loistine Simas with whom I have discussed this patient in full.  ____________________________ Theodore Demark, NP chl:slb D: 09/16/2012 08:42:00 ET T: 09/16/2012 09:03:06 ET JOB#: [id number]  cc: Theodore Demark, NP, <Dictator> [place] SIGNED 10/27/2012 8:24

## 2015-04-10 NOTE — H&P (Signed)
PATIENT NAME:  Ian [name], Ian [name] MR#:  [id number] DATE OF BIRTH:  June 04, 1961  DATE OF ADMISSION:  09/14/2012  PRIMARY CARE PHYSICIAN: None, but does follow up at [place]: Can't move his left leg.   HISTORY OF PRESENT ILLNESS: The patient is a 54 year old man with multiple medical problems including polysubstance abuse with alcohol, cocaine. He presents to the Emergency Room, unknown how he got here. He does live alone. No family with him to give more history. He is complaining of chest pain and vomiting up blood for the past few days. He has had a few falls. He has a history of esophageal cancer as per [facility] but not diagnosed here. He states he can't move his left side. His right side feels very stiff. Today he did have 1 to 2 alcoholic beverages, can't remember anything from 12:00 onward. He feels numb on the left side of his face and had some blurry vision on the left side. Hospitalist Services were contacted for further evaluation.   PAST MEDICAL HISTORY:  1. Alcohol abuse.  2. Hypertension.  3. Angina.  4. Transient ischemic attack.  5. 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  6. Tobacco abuse.  7. Anxiety.  8. Depression.  9. History of prostate cancer, status post seed implantation.   PAST SURGICAL HISTORY:  1. Appendectomy.  2. Cholecystectomy.   ALLERGIES: No known drug allergies.   MEDICATIONS:  1. Trazodone 150 mg at bedtime.  2. Celexa 40 mg daily.   SOCIAL HISTORY: Smokes 1 pack of cigarettes daily. Drinks 1 to 2 beers per day. Cocaine use. He lives alone.   FAMILY HISTORY: Father died of leukemia. Mother died of liver cancer at age 76. Sister had metastatic breast cancer.    REVIEW OF SYSTEMS: CONSTITUTIONAL: Positive for chills. Positive for sweats. Positive for weakness. Positive for weight loss. EYES: Left eye blurry. EARS, NOSE, MOUTH, AND THROAT: Positive for runny nose. Positive for sore throat. No difficulty swallowing. CARDIOVASCULAR:  Positive for chest pain. No palpitations. RESPIRATORY: Positive for shortness of breath. Positive for cough. Positive for phlegm. No hemoptysis. GASTROINTESTINAL: Positive for nausea. Positive for vomiting. Positive hematemesis positive. Positive for abdominal pain. No diarrhea. No constipation. No bright red blood per rectum. GENITOURINARY: No burning on urination or hematuria. MUSCULOSKELETAL: Positive for joint pains. NEUROLOGIC: Can't move the left side. Right side feels stiff. PSYCHIATRIC: Positive for anxiety and depression. ENDOCRINE: No thyroid problems. HEMATOLOGIC/LYMPHATIC: No anemia. No easy bruising or bleeding.   PHYSICAL EXAMINATION:  VITAL SIGNS: Temperature 97.6, pulse 79, respirations 24, blood pressure 140/79, pulse oximetry 97%.   GENERAL: No respiratory distress. He smells like alcohol. No respiratory distress.   HEENT: Conjunctivae bloodshot. Lids normal. Pupils are equal, round, and reactive to light. Extraocular muscles are intact. No nystagmus. Ears, nose, mouth, and throat: Tympanic membranes no erythema. Nasal mucosa no erythema. Throat no erythema. No exudate seen. Lips and gums no lesions.   NECK: No JVD. No bruits. No lymphadenopathy. No thyromegaly. No thyroid nodules palpated.   LUNGS: Lungs are clear to auscultation. No use of accessory muscles to breathe. No rhonchi, rales, or wheeze heard.   CARDIOVASCULAR: S1, S2 normal. No gallops, rubs, or murmurs heard. Carotid upstroke 2+ bilaterally. No bruits. Dorsalis pedis pulses 1+ bilaterally. No edema of the lower extremity.  ABDOMEN: Soft, nontender. No organomegaly/splenomegaly. Normoactive bowel sounds. No masses felt.   LYMPHATIC: No lymph nodes in the neck.   MUSCULOSKELETAL: No clubbing, edema, or cyanosis.  SKIN: Skin tears on the right arm, unkempt with fingernails and toenails with dirt.   NEUROLOGIC: No response to painful stimuli on the left lower extremity, able to move the left hand but very weak.  Right side is able to hold up to gravity but weak.   PSYCHIATRIC: The patient is alert and oriented to person and place.   LABORATORY, DIAGNOSTIC AND RADIOLOGICAL DATA: Troponin negative. Ethanol level 157, INR 1.0, phosphorus 3.4, magnesium 1.9. White blood cell count 13.5, hemoglobin and hematocrit 17.3 and 49.5, platelet count 174. Glucose 93, BUN 12, creatinine 0.97, sodium 137, potassium 4.1, chloride 102, CO2 23, calcium 8.7. Liver function tests are normal. CPK 141. CT scan of the head: No acute intracranial process. ABG showed pH of 7.37, pCO2 42, PaO2 of 272 on 100%, oxygen saturation 99%. Chest x-ray showed no evidence of pneumonia. Urine toxicology positive for cocaine. Urinalysis negative. EKG: Normal sinus rhythm, 93 beats per minute, no acute ST-T wave changes.   ASSESSMENT AND PLAN:  1. Suspected CVA with left-sided weakness, left leg paralysis, no response to painful stimuli on the left leg: We will get an MRI of the brain, carotid ultrasound, echocardiogram PT, OT consultation. I am unable to give an aspirin with complaints of vomiting blood. Treatment is going to be limited. Could also be secondary to the polysubstance abuse since the patient did improve since when he came in.  2. Hematemesis: The last upper endoscopy was here in 03/2011 to rule out Barrett's esophagus. He was told at [facility] that he has esophageal cancer. We will put on IV Protonix drip and octreotide drip. Serial hemoglobins. Clear liquid diet. Gastrointestinal consult in the a.m.  3. Alcohol abuse: We will put on CIWA protocol.  4. Tobacco abuse: Smoking cessation counseling done three minutes by me. Nicotine patch applied.  5. History of hypertension: Blood pressure currently acceptable at this point.  6. History of transient ischemic attack, not on any anticoagulation: Unable to give anticoagulation secondary to hematemesis.  7. Cocaine abuse: Could be the cause of some of the patient's symptoms.  PROGNOSIS: Overall  prognosis is poor. Insight is poor.  TIME SPENT ON ADMISSION: 55 minutes.   ____________________________ Tana Conch. Leslye Peer, MD rjw:cbb D: 09/14/2012 21:47:00 ET T: 09/15/2012 09:49:48 ET JOB#: [id number]  cc: Yale Leslye Peer, MD, <Dictator>  Marisue Brooklyn MD ELECTRONICALLY SIGNED 09/21/2012 21:26

## 2015-04-10 NOTE — Consult Note (Signed)
Chief Complaint:   Subjective/Chief Complaint Patient seen and examined, please see full GI consult.  Patietn admitted with ams and syncopal episode.  Patietn states the left sided weakness is abating.  Patietn with h/o recent diagnosis ( 6/13) of esophageal ca at [facility].  His appetite is good and he has minimal change to swallowing.   Arrangements were made for him to have surgery there, but patietn decided not to follow through, citing alot of personal issues and lack of insurance.  He has ben having brief episodes of small amounts of hematemesis for several months, and has not followed up with [facility].  He is hemodynamically stable and has a normal hgb.  He occasionally sees a  dark/black stool but is currently hemoccult negative.  In discussing the clinical situatio with him further, I believe he wishes to pursue further treatment, and wishes to go to [facility].  According to patient all arrangements up to and including the plans for surgery were made before his change of heart.  Recommend consideration of transfer to [facility] pick up of plans for treatment there as we do not do the needed proceedure here.  Will discuss with [facility] in am. Will hold plan for chest ct or endoscopy at this point as they will likely be part of pre-op evaluation at [facility].  If he changes his mind, would recommend egd and chest ct and oncology consult.  Following.   VITAL SIGNS/ANCILLARY NOTES: **Vital Signs.:   25-Sep-13 15:57   Vital Signs Type Routine   Temperature Temperature (F) 97.9   Celsius 36.6   Temperature Source Oral   Pulse Pulse 68   Respirations Respirations 18   Systolic BP Systolic BP 800   Diastolic BP (mmHg) Diastolic BP (mmHg) 79   Mean BP 98   Pulse Ox % Pulse Ox % 94   Pulse Ox Activity Level  At rest   Oxygen Delivery 2L   Electronic Signatures: Loistine Simas (MD)  (Signed 25-Sep-13 19:15)  Authored: Chief Complaint, VITAL SIGNS/ANCILLARY NOTES   Last Updated: 25-Sep-13 19:15 by Loistine Simas (MD)

## 2015-04-10 NOTE — Consult Note (Signed)
PATIENT NAME:  ALEC, JAROS MR#:  [id number] DATE OF BIRTH:  1961/01/19  DATE OF CONSULTATION:  09/16/2012  REFERRING PHYSICIAN:   CONSULTING PHYSICIAN:  [name], MD  IDENTIFYING INFORMATION AND REASON FOR CONSULT: 54 year old man admitted to the hospital with complaints of acute numbness and paralysis on the left side of his body. Consult for anxiety and depression.   HISTORY OF PRESENT ILLNESS: Information obtained from the patient and from the chart. Patient reports that he was at home and had been feeling weak and unwell for a couple of days when he fell over and lost consciousness. He does not recall the specifics. Says that he does not have much memory for the whole sequence of the events until he got to the hospital. When he became aware he said that he felt numb all up and down the left side of his body and could not move his left arm or left leg. He has been in the hospital now since the 24th and has been worked up for possible stroke. So far the work-up has not revealed any stroke. Neurology consult is pending. Patient's symptoms are starting to abate. He has regained partial strength and fine motor use of his left hand and says that it is starting to feel more normal as well. He says that he still cannot move his left leg very well. He says that he feels anxious pretty much all of the time. This has been a problem for many years but it has been particularly bad since he was told earlier this summer that he had some possibly cancerous cells in his esophagus. Ever since then his anxiety has been almost nonstop. He worries constantly. He has difficulty sleeping at night and his sleep is frequently interrupted. He has had difficulty functioning or thinking very clearly because of his anxiety. In addition to constant general anxiety he also has more severe bouts of panic-like symptoms that happen about twice a week. His mood has been mostly nervous. He feels a little bit down but does not  primarily describes himself as depressed. He says that in the past he has had some passive suicidal thoughts but currently does not have any thought of wanting to kill himself or wanting to die. He denies any psychotic symptoms. He admits that he drinks alcohol but he claims that he does it only infrequently. He says that he had only had a little bit to drink that he can remember just before coming into the hospital this time and that prior to that he had been sober for three weeks. He also admits that he has used some cocaine but says that that is extremely infrequent and that the use that he had this past Sunday was the first time he had used it in his recent memory. He denies that he is using any other drugs. He says that he is compliant with his medications including his citalopram and trazodone at night. He is a little mixed up about all of the medications he is prescribed. It looks like most recently he was also given hydroxyzine as a p.r.n. for anxiety. It appears from his chart that he carries with him that he may have been prescribed clonazepam at one point but he really cannot recall for sure.   PAST PSYCHIATRIC HISTORY: Long history of anxiety and depression symptoms along with substance abuse. He has had two admissions to the psychiatry service in the past few months with the most recent one having a discharge  date of 07/25. On both of those occasions he was intoxicated with alcohol had had cocaine in his system. He gave symptoms of depression and anxiety similar to what he is doing right now. He also at that time tended to minimize his substance abuse problems despite the obvious way that they were presenting. It appears that he has not followed up with any substance abuse treatment outside of the hospital. He does give a somewhat confusing history of having gone to see outpatient psychiatrists in the recent past, although I am not clear whether that was after his most recent hospitalizations or not.  He did not think much of it and does not seem to be following up with it. Most recently he had been on Celexa 40 mg a day and trazodone 100 mg at night. He cannot remember any other psychiatric medicines he has been on in the past. He says that he does have a past history of a suicide attempt but it was about a year ago by overdose.   SOCIAL HISTORY: patient lives alone in a trailer. He is not able to drive. He just recently got his disability started. He is in a bad financial position. He gets some assistance from a friend who lives nearby. His closest relative is a sister but she lives at [facility] and is not able to be around him daily. He is estranged from the rest of his family.   MEDICAL HISTORY: Patient has apparently been diagnosed as having esophageal cancer. He has not followed up with recommended treatment. It is not clear to me just how much evaluation has been done or what specifics are known about his particular cancer. He has not been getting any treatment for it despite the fact that it has been explained to him that this is a very aggressive cancer with a high mortality. Patient also has hypertension, history of tobacco abuse, history of prostate cancer in remission, history of a cholecystectomy.   FAMILY HISTORY: He says he had an uncle who had problems with his nerves but denies any other family history of mental illness.   CURRENT MEDICATIONS: Psychiatrically we see him being continued on 40 mg of citalopram per day, 100 mg of trazodone at night.   SUBSTANCE ABUSE HISTORY: As detailed above, he has a long history of alcohol abuse. He denies any history of seizures or DTs. He tends to minimize this and claimed that he only occasionally uses and does not want to discuss treatment for it. Also has a history of abusing cocaine.   REVIEW OF SYSTEMS: Complains of feeling anxious and shaky all over. Feeling weak. Still having difficulty moving his left leg. Denies suicidal ideation. Denies  psychotic symptoms. Otherwise noncontributory.   MENTAL STATUS EXAM: Disheveled poorly groomed man interviewed in a hospital bed. Cooperative with the exam. Eye contact intermittent. Psychomotor activity marked by fidgetiness and tremor. Speech was normal in rate, tone, and volume. Affect appeared generally anxious. Mood was stated as nervous. Thoughts were lucid. No obvious delusions or loosening of associations. Patient's insight and judgment and understanding of his condition appears to be questionable at best. Denies suicidal or homicidal ideation currently. No evidence of acute psychosis. Appears to probably have normal intelligence at baseline. Short-term and longer term memory both show some gaps.   ASSESSMENT: This is a 54 year old man who presents with symptoms of chronic anxiety and depression which are persisting even as he goes through detox. This is a similar presentation to what he has  had in the past. He also clearly has an alcohol and cocaine dependence problem about which he has poor insight. Patient has very limited social resources. I do believe him that his anxiety has been very crippling and disturbing for him. He has been treated with citalopram 40 mg a day which is an appropriate treatment but does not seem to have given him any relief so far. Patient is at high risk of abuse, dependence and overdose if given benzodiazepines on a regular basis. I think it would probably be wiser to see if there are other ways to treat his anxiety before resorting to standing doses of benzodiazepines.   TREATMENT PLAN: Discontinue the citalopram and replace it with Lexapro 20 mg a day, which is an equivalent dose. This medicine should have fewer side effects and fewer drug interactions and might be more effective and could potentially be increased to a higher dose. I am going to start him on Seroquel 25 mg 3 times a day. Although this is not an approval treatment for generalized anxiety disorder there is  some very good evidence that it can be effective for this condition and may help with his depression by supplementing the antidepressant as well. I would not recommend standing doses of benzodiazepines. Patient was given some counseling about substance abuse but he does not appear to be ready to engage in any kind of serious discussion about that at this point. I understand there is also concern about whether his presenting physical symptoms could be conversion symptoms or physical manifestations of his anxiety. That certainly is quite possible. He has a lot of somatic presentation of his anxiety in general. The fact that they may be would not change my treatment plan.     DIAGNOSIS PRINCIPLE AND PRIMARY:  AXIS I:  1. Generalized anxiety disorder.  2. Depression, not otherwise specified.  3. Alcohol dependence.  4. Cocaine dependence.   AXIS II: Deferred.   AXIS III:  1. Esophageal cancer. 2. Hypertension. 3. Alcohol withdrawal.   AXIS IV: Severe stress from isolation, minimal support.   AXIS V: Functioning at time of evaluation 54.  ____________________________ [name], MD jtc:cms D: 09/16/2012 14:22:53 ET T: 09/16/2012 15:55:30 ET JOB#: [id number]  cc: [name], MD, <Dictator>  [name] MD ELECTRONICALLY SIGNED 09/16/2012 22:43

## 2015-04-10 NOTE — Consult Note (Signed)
Brief Consult Note: Diagnosis: CVA.   Patient was seen by consultant.   Consult note dictated.   Comments: Attempted to see patient who is on his way to MRI.  Electronic Signatures: Stephens November H (NP)  (Signed 25-Sep-13 14:17)  Authored: Brief Consult Note   Last Updated: 25-Sep-13 14:17 by Theodore Demark (NP)

## 2015-04-10 NOTE — Consult Note (Signed)
Brief Consult Note: Diagnosis: Left lower extremity weakness and hip pain.   Patient was seen by consultant.   Recommend further assessment or treatment.   Orders entered.   Discussed with Attending MD.   Comments: Case was discussed with Dr. Leslye Peer.  Patient has two complaints today during consultation.   The first is lower extremity weakness which began a few days ago and included his left upper extremity initially, but this has improved.  Patient states he can feel his toes but has paresthesias in the thigh and lower leg.  He states he can not flex his hip or knee actively.  The second issue is hip/groin pain.  There is no history of injury.  His PMHx is reviewed on his admission H&P.  On exam of the left lower extremity, patient has no deformity, swelling, ecchymosis or erythema.  There are no signs of trauma.  Patient can flex and extend his toes and has sensation throughout the left foot, but he states he only feels "pins and needles" with light touch of the lower leg and thigh.  Patient can have his knee and hip flexed to 120 degrees without pain.  Rotation of the hip in 90 degrees flexion causes moderate discomfort over the anterior hip and over the lateral hip.  He has mild/moderate tenderness over the anterior hip as well.  Radiographs are reviewed including the pelvis and left hip demonstrate no fracture, dislocation or significant degenerative changes.  I recommend that since the etiology of the lower extremity weakness is still unknown that an MRI of the lumber spine be ordered to evaluate for herniated disc or other mass effect causing paresthesias and paralysis although his sensory deficits do not follow a common dermatomal pattern and with weakness involving so many muscle groups would require a large lesion or compression at multiple levels.  Also he does complain of right hip pain and an MRI will help to evaluate for sources of pain such as occult fracture or AVN given his EtOH history.   Patient reported to have walked 15' today which makes fracture less likely.  Will follow up on MRI results when available.  Electronic Signatures: Thornton Park (MD)  (Signed 27-Sep-13 20:27)  Authored: Brief Consult Note   Last Updated: 27-Sep-13 20:27 by Thornton Park (MD)

## 2015-04-10 NOTE — Discharge Summary (Signed)
PATIENT NAME:  Ian, [name] MR#:  [id number] DATE OF BIRTH:  [birth year]  DATE OF ADMISSION:  10/30/2012 DATE OF DISCHARGE:  11/05/2012  HOSPITAL COURSE: See dictated history and physical for details of admission. This 54 year old man was admitted to the hospital on November 9th after presenting to the Emergency Room intoxicated with reports of severely depressed mood and suicidal thoughts. In the hospital he was treated with detox protocol. He continued to have tremor and high blood pressure for several days. I eventually put him on some standing doses of Librium to control his withdrawal which have been gradually decreased up to discharge. There were brief periods early on when he seemed to be slightly delirious but this was completely cleared up before discharge. He did not have a seizure. His mood remained anxious and somewhat dysphoric throughout his hospital stay, although he did not behave in a suicidal or dangerous manner. By the time of discharge he was denying suicidality. The patient remained very focused on the diagnosis he had allegedly been given of having esophageal cancer. It is not clear from his description what stage this was at and it sounds like it may have been a Barrett's esophagus diagnosis. In any case, he had never followed up with the recommended treatment and just gets more and more anxious and panicky about it. The patient was cooperative for the most part with treatment planning, although he tended to have poor insight about his substance abuse and be very resistant to substance abuse treatment. He was treated with Lexapro for his depression and anxiety. Other than the detox taper, I attempted to not use standing benzodiazepines. He was treated conservatively for his chronic pain as well because of his history of abuse of prescription drugs. He was given tramadol which he tolerated well and did not appear to be abusing. The patient was started back on Seroquel at a dose similar to  what he was taking on his last admission which helped with his depression and anxiety. The patient was not interested in going to residential psychiatric treatment for his substance abuse. He wanted to return back to his home and attempt to get his family to help him with arranging follow-up for his esophageal problem. He was referred back to mental health in the community and will be following up with Simrun with his therapist and with Dr. Cheral Almas.   DISCHARGE MEDICATIONS:  1. Tramadol 50 mg q.6 hours p.r.n. for pain with only 20 given.  2. Trazodone 300 mg at night.  3. Quetiapine 25 mg 3 times a day.  4. Nexium 40 mg twice a day.  5. Seroquel 100 mg at bedtime.  6. Gabapentin 600 mg 3 times a day.  7. Lisinopril 10 mg per day.  8. Lexapro 20 mg per day.   LABORATORY RESULTS: Admission labs showed an initial total CK normal at 96. Troponin negative. He did have an elevated white count at 16.5, otherwise normal CBC. Chemistry panel showed a slightly elevated AST at 40, otherwise completely normal. Alcohol level was 174. TSH normal at 0.9. Urinalysis unremarkable. Drug screen positive for cocaine.   EKG showed normal sinus rhythm.   Chest x-ray unremarkable.   Ammonia 29. Total protein on follow-up was low at 5.6 with albumin low at 2.7.   DISPOSITION: He is discharged back to his own home with follow-up arranged with Simrun. He agrees to the plan.   MENTAL STATUS EXAM: Disheveled, poorly groomed man who looks older than his stated age. He  is cooperative with the interview. Alert and oriented. Affect is flattish. Mood stated as nervous. Thoughts were generally lucid with no sign of delusional thinking or loosening of associations. Denied hallucinations. Denied suicidal or homicidal ideation. Voice normal in rate and tone. Insight and judgment improved, baseline intelligence average. Short-term memory grossly intact.   DIAGNOSIS PRINCIPLE AND PRIMARY:  AXIS I: Depression, not otherwise  specified.   SECONDARY DIAGNOSES:  AXIS I:  1. Generalized anxiety disorder.  2. Alcohol dependence.  3. Cocaine abuse.   AXIS II: Dependent features.   AXIS III:  1. Esophageal cancerous lesion of some sort, details lacking. 2. Hypertension. 3. Hypertriglyceridemia.   AXIS IV: Severe from lack of resources and assistance.   AXIS V: Functioning at time of discharge 65.   ____________________________ Ian Lex, MD jtc:drc D: 11/05/2012 22:31:23 ET T: 11/08/2012 10:06:33 ET JOB#: [id number]  cc: Ian Lex, MD, <Dictator> Ian Lex MD ELECTRONICALLY SIGNED 11/08/2012 10:30

## 2015-04-10 NOTE — H&P (Signed)
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 The patient is divorced twice and lives by himself in an apartment. The patient comes to [facility] Emergency Room with chief complaint "I have been feeling depressed. I have pain in my stomach and I was scheduled for surgery. I need help. Nobody is helping me. My friends are not helping me. I am having a problem with vomiting bright hematemesis and I was scheduled for surgery and I could not go". The patient continued to talk at this rate.   HISTORY OF PRESENT ILLNESS: The patient admits to drinking alcohol at a rate of two 40 ounces of beer and last drink was two days ago. He is not very sure about the same. In addition, according to the Emergency Room nurse, the patient has been using cocaine whenever available for free from friends. He came to the Emergency Room voluntarily after calling crisis center to get help.   PAST PSYCHIATRIC HISTORY: History of inpatient hospitalization on Psychiatry about four times so far. He has been inpatient for depression and suicide attempt. He was inpatient at [facility] after suicide attempt of cutting wrists. He was last admitted to [facility] in June of 2013 when he came to the Emergency Room with blood alcohol level of 272 with impaired judgment and not taking his antihypertensive medications. The patient reported that he was stressed out because of possible malignant or premalignant cells in his esophagus/stomach and having to undergo surgery at that time. The patient was going to follow-up appointments at [facility] and he reports that he did keep up his appointments with a lady physician who talked to him and gave him medications. He was discharged on the following medications from [facility] in June of  2013: 1. Celexa 40 mg. 2. Micardis 80 mg. 3. Trazodone 100 mg at bedtime. The patient reports that trazodone does not help him rest at all. He reports that on one occasion he had been to a different physician who gave him Klonopin which was the only medication that has ever helped him rest.   FAMILY HISTORY OF MENTAL ILLNESS: One of his uncles has schizophrenia. No known history of suicides in the family.   FAMILY HISTORY: Raised by parents. Father was a Sales executive. He died of leukemia in his 7's. Mother died of liver cancer in her 23's. He has two brothers and one sister. Close to sister but not to brothers.   PERSONAL HISTORY: Born in [place]. Dropped out of school because he got married at age 5 years. No GED.   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   MILITARY HISTORY: None.   MARRIAGES: Married twice. Both marriages ended because he could not get along with his wife. Has three children from first marriage. Gets to see the children but not too often.   ALCOHOL AND DRUGS: First drink of alcohol was at a very young age, became a problem soon after. He starts drinking alcohol whenever he's stressed out. He drinks at a rate of two 40-ounce beers every day. In addition, he gets cocaine if he can get it from his friends and he reports "I don't buy it at all".  Last drink of alcohol was two days ago. Has had four DWI's. Lost his driver's license, no driver's license now. He reports he has not yet figured out how to get around and has difficulty with transportation. He has difficulty keeping up his appointments because of the same. Sometimes he gets a ride from [employer]. Denies smoking THC. Never used IV drugs. Does admit smoking cigarettes at a rate of a pack a day for many years, depends on how he feels.   MEDICAL HISTORY: Has high blood pressure. No known  history of diabetes mellitus. History of malignant hypertension. History of prostate cancer. Has probable gastrointestinal cancer for which he was scheduled surgery in June of 2013 and he could not make it because he could not keep up his appointments at [facility]. Has GERD. Status post cholecystectomy. Probable esophageal varices.   ALLERGIES: No known drug allergies.   PHYSICAL EXAMINATION:   VITAL SIGNS: Temperature 96.8, pulse 80 per minute and regular, respirations 20 per minute and regular, blood pressure 130/80 mmHg.   HEENT: Head is normocephalic and atraumatic. Pupils equal, round, and reactive to light and accommodation. Fundi bilaterally benign. EOMS visualized. Tympanic membranes visualized. No exudates.   NECK: Supple without any organomegaly, lymphadenopathy, or thyromegaly.  CHEST: Normal expansion. Normal breath sounds heard.   EXTREMITIES: No cyanosis, clubbing, or edema.   LUNGS: Clear to auscultation. No wheezing, rales, or rhonchi.   HEART: Normal S1, S2 without any murmurs or gallops.  ABDOMEN: Bowel sounds positive. No masses.   RECTAL: Deferred.   NEUROLOGIC: Gait is normal. Romberg is negative. Cranial nerves II through XII grossly intact. Deep tendon reflexes 2+ and normal. The patient reports that he has sensory loss on the left lower limb and it is not as good as the right leg, however, plantars are normal response and Deep tendon reflexes are normal response.   MENTAL STATUS EXAMINATION: The patient is dressed in street clothes, alert and oriented with prompting and help. Eye contact is fair. Focus and concentration are decreased because he is preoccupied about him not getting enough help from the doctors. Memory is intact to recent and remote events. Speech is normal rate and content. Thought process logical and goal directed. He is preoccupied about getting his medical problems straightened out and getting his surgery done at [facility]. No looseness of  association. Denies any ideas or plans to hurt himself or others. Does not appear to be responding to internal stimuli. Cognition grossly intact. General knowledge and information is fair for level of education. Admits that he is feeling very depressed and hopeless about his current situation though he denies any ideas or plans to hurt himself or others. Insight and judgment fair/guarded.   IMPRESSION: AXIS I: 1. Major depressive disorder, recurrent, moderate. 2. Anxiety disorder, not otherwise specified. 3. Alcohol dependence. 4. History of cocaine abuse. 5. THC dependence.  AXIS II: Deferred.   AXIS III:  1. Hypertension. 2. Status post cholecystectomy. 3. Gastroesophageal reflux disease.  4. Scheduled for surgery for esophageal varices at [facility].  5. History of malignant hypertension.   AXIS IV: Severe. Occupational, financial, lives by himself, not much family support, major medical problems.  AXIS V: GAF 25.  PLAN: The patient is admitted to [facility] for close observation, evaluation, and help. He will be started on detox as needed for his alcohol dependence. He will be continued on the rest of his medications. During the stay in the hospital, he will be  given milieu therapy and supportive counseling. If necessary, hospitalist consultation will be requested so that he can get help and follow-up and decide by him as he wants to be followed at [facility] for his physical problems after discharge. In addition, Social Services will contact [place] about his surgery which was scheduled in June of 2013 and he could not make it because of transportation problems. He reports that he was diagnosed with precancerous cells and he was recommended surgery for the same.   ____________________________ Wallace Cullens. Franchot Mimes, MD skc:drc D: 10/30/2012 19:51:28 ET T: 10/31/2012 10:08:09 ET JOB#: [id number]  cc: Arlyn Leak K. Franchot Mimes, MD, <Dictator> Dewain Penning MD ELECTRONICALLY  SIGNED 10/31/2012 17:36

## 2015-04-10 NOTE — Consult Note (Signed)
Brief Consult Note: Diagnosis: CVA.   Patient was seen by consultant.   Consult note dictated.   Comments: Appreciate consult for 54 y/o caucasian man with history of polysubstance abuse,htn, angina, TIA, prostate cancer, COPD, for evaluation of hematemesis. Recently dx'd with  esophageal ca at [facility]. Underwent EGD 6/13 by Dr Alyson Locket, and  had EUS there. Due to extent of the cancer, was arranged for him to have an esophageal resection by Dr Tor Netters, a thoracic surgeon over the summer. Patient states that he got overwhelmed and decided to cancel procedure, despite discussions with his care team at [facility]. Last admitted here 6.13 with hematemesis that resolved, and was evaluated by pysch for suicidal ideation. Has received psych care on an outpatient basis, but not recently. Unfortunately, he has returned to drinking etoh/ intranasal cocaine, & was adm here yest after binge the day prior with CVA type symptoms, see admit H&P. States that these are improving today. Regarding hematemesis, states he has nausea and vomits 2-3 times/wk: looks like reddish and stringy. Does report some acid reflux, generalized abdominal pain, and occasional dysphagia- but states that he eats well. States bowels have been moving regularly, formed, green in color. Weight fluctuates up and down.Was on Nexium at one time, does not appear to be taking this lately. No further GI complaints, but does report an > in cough/wheeze, and sweating. Had colonoscopy 05/2012 with one adenoma without high grade dysplasia/malignancy. Underwent CT 2/13 of abdominal pain, did not show any acute abnormality.  Impression/Plan: Esophageal cancer-  get path reports from [facility], d/w patient the aggressiveness of this type ca,  that there are pt's that do well after surgery. States may be willing to reconsider surgery.  This may be contributing to hematemesis, but with pulmonary complaints, will assess CT chest. May reconsider EGD later, as minimal abd pain, hgb  16-17 and heme negative on exam. Rec bid IV ppi.  May need referral back to [facility]. Will follow with you. Did recommend stopping substances..  Electronic Signatures: Stephens November H (NP)  (Signed 25-Sep-13 18:16)  Authored: Brief Consult Note   Last Updated: 25-Sep-13 18:16 by Theodore Demark (NP)

## 2015-04-10 NOTE — Consult Note (Signed)
PATIENT NAME:  Ian [name], Ian [name] MR#:  [id number] DATE OF BIRTH:  14-Aug-1961  DATE OF CONSULTATION:  09/17/2012  REFERRING PHYSICIAN:  Loletha Grayer, MD   CONSULTING PHYSICIAN:  Rudell Cobb. Loletta Specter, MD  HISTORY: 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 He was admitted 09/14/2012 with complaint of inability to move his left leg. He is referred for evaluation of left leg weakness. History comes from the patient and from his hospital chart.   The patient was brought to the Emergency Room by EMTs at 7:20 p.m. on 09/14/2012 with reported complaints including chest pain, emesis, two falls over a few days, and that he could not move his left leg. His right side felt very stiff for approximately four hours. On arrival to the Emergency Room, blood pressure is 174/115 with heart rate 88, respirations 20, saturation 98%. On admission examination, he was found to have greater than  antigravity strength of both right limbs, left hand very weak, and in the left leg no response to painful stimuli. Brain CT scan showed brain MRI scan was benign. Cervical spine MRI scan was benign.   The patient reports that beginning around lunchtime on 09/14/2012,  he began to feel lightheaded and had ringing of his ears. When he would stand up, he had trouble with feeling and use of the left arm and hand and left leg. He reports that he had brief loss of consciousness off and on on the way to the hospital.   He reports history beginning in the early 1990s with episodes usually lasting 30 to 60 minutes of left hand tingling and numbness, difficulty using the hand, and occasionally also including numbness of the left leg and occasionally the face. He estimated  he has had 40 to 50 such spells altogether, but then reported 50 or 60 thus far in 2013. He reports his last spell that lasted more than an  hour was approximately a year ago and lasted three to four hours.   He reports a 55-month history of problems with pain of the left proximal thigh and groin area. He has not had imaging of this. Not noted above, the patient's laboratory studies were notable for ethanol level of 157. He reported he had two beers the morning of the day of admission and did not  have any alcohol for a couple of weeks before that. He reports he has had 4 or 5 DWI citations and at present does not have license to drive.   PHYSICAL EXAMINATION: The patient is a well-developed white gentleman who was in no apparent distress, lying semisupine, blood pressure 135/75 and heart rate 76. There was no fever. He was normocephalic without evidence of trauma. His neck was supple with decreased cervical range of motion for age. He was alert and oriented with clear speech and normal expression. He was lucid and was a fair historian overall. Cranial nerve examination showed symmetric facial sensation, symmetric facial appearance at rest and with conversation, normal eye movements, full visual field to finger count for each eye. Motor examination of the extremities showed normal strength proximally and distally in the right arm and leg. There was some early give way with testing proximally and distally in the left upper extremity but strength overall rated greater than  4+ out of 5. Strength in the left lower extremity was rated overall greater than or equal to 4 out of 5 with again significant early give way and  variability of strength elicitable. Coordination examination was normal bilaterally in the upper extremities. Foot tapping was of decreased excursion on the left. He had no tremor at rest and with movements, including no postural tremor with the arms held extended forward; there was also some abnormal  pronator drift. Sensory examination was asymmetric with subjective relative decrease of light touch left arm and leg. His gait was not tested. Aching discomfort of the groin on the left was elicited with internal and external passive ROM  movements.    IMPRESSION:  1. I suspect that he may have arthritis or bursitis as the cause of left proximal right groin area pain problems.  2. 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  3. He described a long history of episodes of symptoms on the left hand and arm and leg and of the  left side of the face,  unclear etiology. The possibilities include carpal tunnel syndrome and cervical and lumbosacral radiculitis, at present also left leg discomfort. His examination indicates an element of conversion reaction or stress reaction.   RECOMMENDATIONS:  1. Orthopedics evaluation with regard to suspected left hip arthritis or bursitis.  2. The patient is strongly encouraged to consider discontinuing alcohol altogether.  3. I agree with indication for psychiatric evaluation.    I appreciate being asked to see this interesting gentleman.   ____________________________ Rudell Cobb. Loletta Specter, MD prc:cbb D: 09/18/2012 21:02:49 ET T: 09/19/2012 12:54:07 ET JOB#: [id number] Linton Flemings MD ELECTRONICALLY SIGNED 09/30/2012 10:47

## 2015-04-10 NOTE — Consult Note (Signed)
Details:    - Psychiatry: Patient seen. He still complains of being anxious all the time, but he looks a lot calmer. He is not shaking and he has made significant functional  progress with improvemenrt in walking. He still takls about wanting more medicine for "nerves" and not feeling any better, but I would not suggest adding benzos and I gave him a lot of support around the progress he has made.   Alert and oriented, affect euthymic, lucid, not psychotic. Nml psychomoter activity. No SI  Continue current seroquel  Suggest he be refered to [facility] at [phone] for outpt psychiatry treatment .   Electronic Signatures: [name] (MD)  (Signed 01-Oct-13 17:30)  Authored: Details   Last Updated: 01-Oct-13 17:30 by [name] (MD)

## 2015-04-10 NOTE — Consult Note (Signed)
Details:    - Psychhiatry: Patient seen. Patient with anxiety disorder and prob. somatic symptoms as a direct result. His physical symptoms are showing stteady improvement. Still no direct evidence of a physiologic cause. His nerves are still liable to being tore up but he looks a lot less shakey than he did and tolerated the seroquel well. Not oversedated. Will increase the seroquel dose to 50mg  tid for anxiety and mood. Review social situation as well as his MSE. No SI, no sign of psychosis. A&O and lucid. Supportive therapy done. Will follow up over the weekend.   Electronic Signatures: [name], [name] (MD)  (Signed 27-Sep-13 17:40)  Authored: Details   Last Updated: 27-Sep-13 17:40 by [name] (MD)

## 2015-04-10 NOTE — Discharge Summary (Signed)
PATIENT NAME:  Ian [name], Ian [name] MR#:  [id number] DATE OF BIRTH:  10-04-1961  DATE OF ADMISSION:  09/14/2012 DATE OF DISCHARGE:  09/22/2012  ADDENDUM: The patient is clinically stable.  He will be discharged to a nursing home today.   ____________________________ Demetrios Loll, MD qc:cbb D: 09/22/2012 10:06:48 ET T: 09/22/2012 10:12:46 ET JOB#: [id number]  cc: Demetrios Loll, MD, <Dictator> Demetrios Loll MD ELECTRONICALLY SIGNED 09/22/2012 15:17

## 2015-04-10 NOTE — Consult Note (Signed)
Brief Consult Note: Diagnosis: generalized anxiety disorder.   Patient was seen by consultant.   Consult note dictated.   Recommend further assessment or treatment.   Orders entered.   Discussed with Attending MD.   Comments: Psychiatry: Patient seen. Note done. Severe GAD. Poor coping skills. Also established SA prob with poor insight. Already on 40mg  celexa. Will change celexa to lexapro equivilent dose. Fewer interactions, possibly more effective. Will start 25mg  seroquel tid for anxiety. May also help with mood. Will not add standing benzos now due to high risk of abuse. Will follow inpt.  Electronic Signatures: Karthika Glasper, [name] (MD)  (Signed 26-Sep-13 14:07)  Authored: Brief Consult Note   Last Updated: 26-Sep-13 14:07 by [name] (MD)

## 2015-04-13 NOTE — Consult Note (Signed)
Brief Consult Note: Diagnosis: Alcohol dependence.   Patient was seen by consultant.   Consult note dictated.   Recommend further assessment or treatment.   Orders entered.   Comments: Ian [name] has a long h/o alcoholism. He was detoxed in May and spent 30 days in rehab. Using alcohol much less lately.  PLAN: 1. The patient is not interested in substance abuse treatment at this point.   2. He requests Seroquel at night.  Electronic Signatures: Orson Slick (MD)  (Signed 11-Aug-14 20:20)  Authored: Brief Consult Note   Last Updated: 11-Aug-14 20:20 by Orson Slick (MD)

## 2015-04-13 NOTE — Consult Note (Signed)
PATIENT NAME:  Ian [name], Ian [name] MR#:  [id number] DATE OF BIRTH:  18-Mar-1961  DATE OF CONSULTATION:  12/24/2012  DATE OF ADMISSION:  12/23/2012  REQUESTING PHYSICIAN:  Dr. Graciella Freer.  CONSULTING PHYSICIAN:  Jolanta B. Pucilowska, MD  REASON FOR CONSULTATION: To evaluate a suicidal patient.   IDENTIFYING DATA: The patient is a 54 year old male with history of depression and alcoholism.   CHIEF COMPLAINT: "I feel sad."   HISTORY OF PRESENT ILLNESS: [facility]. He was discharged on December 29th, following 7 days of hospitalization. He relapsed on alcohol right away and comes to the Emergency Room with blood alcohol level over 200. He, again, feels depressed, and does not feel like living anymore. He complains of having cancer and chronic pain. The patient has Barrett's esophagus, and has not been following up with his treatment at [facility]. It is unclear whether or not he has cancer. He has missed several appointments with his gastroenterologist. He comes to the Emergency Room frequently complaining of pain, sometimes of suicidality: He endorses many symptoms of depression with poor sleep, decreased appetite, anhedonia, feeling of guilt, hopelessness, worthlessness, poor energy and concentration, social isolation and suicidal ideation with no intent or a plan.   PAST PSYCHIATRIC HISTORY: Several hospitalizations at mid at [facility] and here, mostly for alcohol detox. There is a history of suicide attempt by cutting wrists. He has been compliant with his appointment at [facility] and [facility] for therapy. He was referred to [place] IOP program, but missed his initial appointment.   He does not have a medical provider to take care of his needs locally, and has not been able to attend [facility] appointments, even though he has Medicaid and could use Medicaid ride.   FAMILY PSYCHIATRIC HISTORY: There is an uncle with schizophrenia. No  known completed suicides in the family.   PAST MEDICAL HISTORY:  Hypertension, GERD, Barrett's esophagus.   ALLERGIES: CELEXA, TRAZODONE, VICODIN.   MEDICATIONS ON ADMISSION:  Carbamazepine 200 mg twice daily, Lexapro 20 mg daily, Nexium 40 mg twice daily, Neurontin 300 mg 3 times daily, hydroxyzine 50 mg every 4 hours, lisinopril 10 mg daily, Seroquel 100 mg at bedtime.   SOCIAL HISTORY: He is from [place]. Dropped out of school at the age of 35 to get married. He has been married twice, has 3 children, but not close to them. 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 He applied for disability twice and was rejected. He has Medicaid.     REVIEW OF SYSTEMS:   CONSTITUTIONAL: No fevers or chills. No weight changes.  EYES: No double or blurred vision.  EARS, NOSE, THROAT: No hearing losses.  RESPIRATORY: No shortness of breath or cough.  CARDIOVASCULAR: No chest pain or orthopnea.  GASTROINTESTINAL: No abdominal pain, nausea, vomiting, or diarrhea.  GENITOURINARY: No incontinence or frequency.  ENDOCRINE: No heat or cold intolerance.  LYMPHATIC: No anemia or easy bruising.  INTEGUMENTARY: No acne or rash.  MUSCULOSKELETAL: No muscle or joint pain.  NEUROLOGIC: No tingling or weakness.  PSYCHIATRIC: See history of present illness for details.   PHYSICAL EXAMINATION: VITAL SIGNS: Blood pressure 125/73, pulse 93, respirations 18, temperature 97.8.  GENERAL: This is a well-developed male in no acute distress. The rest of the physical examination is deferred to his primary attending.   LABORATORY DATA: Chemistries within normal limits except for sodium 135. Blood alcohol level 0.208.  LFTs within normal limits. TSH 2.63. Tegretol level 9.8. Urine tox screen positive for cocaine. CBC within normal limits, except for white blood count of 18.6. Urinalysis is not suggestive of urinary tract infection. Serum acetaminophen and salicylates are low.    MENTAL STATUS EXAMINATION: The patient is alert and oriented to person, place, time  and situation. He is pleasant, polite and cooperative. He is poorly groomed with strong body odor, wearing hospital scrubs. He maintains good eye contact. His speech is soft. Mood is depressed with flat affect. Thought processing is logical and goal oriented. Thought content: He denies suicidal or homicidal ideations now, but came to the hospital complaining of suicidal ideation without intention or a plan. There are no delusions or paranoia. There are no auditory or visual hallucinations. His cognition is grossly intact. He registers 3/3 and recalls 3/3 objects after five minutes. He can spell cat forward and backward. He knows the current president. His insight and judgment are questionable.   SUICIDE RISK ASSESSMENT: This is a patient with a lifelong history of alcoholism and substance abuse and mood instability and suicide attempt in the past. He is at increased risk for  suicide attempt.   DIAGNOSES: AXIS I:  Alcohol dependence. Cocaine abuse. Mood disorder, not otherwise specified.  AXIS II: Deferred.  AXIS III: Hypertension.  Barrett's esophagus.  AXIS IV: Substance abuse. Mental and physical illness. Access to care.  AXIS V: Global assessment of functioning score on admission 25.   PLAN:  1.  The patient was referred to [facility] rehab facility. Transfer as soon as a bed is available.  2.  We restarted all his medications as at the time of discharge.  3.  I do not believe that the patient needs detox. Vital signs are stable. We will monitor.  4.  I will follow up.     ____________________________ Herma Ard B. Bary Leriche, MD jbp:dm D: 12/24/2012 19:09:06 ET T: 12/24/2012 20:47:38 ET JOB#: [id number]  cc: Jolanta B. Bary Leriche, MD, <Dictator> Clovis Fredrickson MD ELECTRONICALLY SIGNED 12/25/2012 2:01

## 2015-04-13 NOTE — Consult Note (Signed)
PATIENT NAME:  [name], [name] MR#:  [id number] DATE OF BIRTH:  1961-01-11  DATE OF CONSULTATION:  08/01/2013  REFERRING PHYSICIAN:  Monica Becton, MD CONSULTING PHYSICIAN:  Ian Anfinson B. Newell Frater, MD  REASON FOR CONSULTATION: To evaluate a patient with substance use.   IDENTIFYING DATA: Ian [name] is a 54 year old male with history of alcoholism.   CHIEF COMPLAINT: "I'm good now."   HISTORY OF PRESENT ILLNESS: Ian [name] was admitted to [facility] medicine for left-sided weakness. He was positive for alcohol, cocaine, and opiates on admission. Psychiatry was asked to evaluate this patient for substance abuse treatment. The patient is not interested. He was hospitalized at [place] unit in May 2014.   He was then transferred to [facility] facility in [place]. He spent one month there. He states that since discharge from [facility], he has been using alcohol and drugs only sporadically. He does not feel that he needs detox and certainly does not need treatment. He thinks that his drinking is "a thousand times better" than it used to be.   He denies any symptoms of depression, anxiety or psychosis. He minimizes the cocaine habit, stating that it is only happened once. He reports good compliance with medication. He specifically values Seroquel 300 mg at night and that he feels in combination with Ativan gives him a good night's sleep with no hangover in the morning.   He is very focused on treatment of his medical conditions. He has Barrett esophagus that needs to be operated on, and until now, he was unable to find the courage to address his cancer. He now feels that he is able to face it.   In addition, he has Medicaid that would allow him to have access to medications and doctors. He hopes to spend some time in recovery at the skilled nursing facility as he has absolutely no support in the community.   PAST PSYCHIATRIC HISTORY:  Multiple previous admissions for detox with daily drinking habit for years. He denies suicide attempts. He believes that he may be bipolar as he does better on a mood stabilizer.   FAMILY PSYCHIATRIC HISTORY: Other family members with alcoholism.   PAST MEDICAL HISTORY: Dyslipidemia, hepatitis C,  history of TIA, Barrett esophagus, hypertension, peptic artery disease, prostate cancer, gastroesophageal reflux disease.   ALLERGIES: [place].   MEDICATIONS AT THE TIME OF ADMISSION: Seroquel at bedtime, Seroquel 25 mg three times daily,  Omeprazole 40 mg twice daily, lorazepam 1 mg three times daily, lisinopril 20 mg daily, hydroxyzine 1 tablet unknown strength 3 times a day, gabapentin 600 mg three times daily,  Lexapro 20 mg daily.   MEDICATIONS AT THE TIME OF CONSULTATION: CIWA protocol. Lisinopril 10 mg daily, Lexapro 20 mg daily, Lovenox injections, Lipitor 40 mg daily, aspirin 325 mg daily, nitroglycerine paste.   SOCIAL HISTORY: He lives alone. He has a sister living in the area who encourages treatment for substance abuse and cancer. He works at [employer] course and likes his job a lot. Today he was surprised that five different people visited him in the hospital. He did not have any idea that he is well liked and has some support in the community. For the first time in a long time, he feels that  life is worth living and treatment is available and possible.   REVIEW OF SYSTEMS:  CONSTITUTIONAL: No fevers or chills. Positive for some weight loss.  EYES: No double or blurred vision.  ENT: No hearing loss.  RESPIRATORY: No shortness of breath or cough.  CARDIOVASCULAR: No chest pain or orthopnea.  GASTROINTESTINAL: No abdominal pain, nausea, vomiting, or diarrhea.  GENITOURINARY: No incontinence or frequency.  ENDOCRINE: No heat or cold intolerance.  LYMPHATIC: No anemia or easy bruising.  INTEGUMENTARY: No acne or rash.  MUSCULOSKELETAL: No muscle or joint pain.  NEUROLOGIC:  Left-sided weakness, resolved.  PSYCHIATRIC: See history of present illness for details.   PHYSICAL EXAMINATION:  VITAL SIGNS: Blood pressure 150/100, pulse 79, respirations 18, temperature 98.1.  GENERAL: A well-developed male in no acute distress.   The rest of the physical examination is deferred to his primary attending.   LABORATORY DATA: Chemistries within normal limits. Blood alcohol level is 0.153. LFTs within normal limits. Cardiac enzymes negative x3. TSH 1.25.  Urine tox screen positive for cocaine and opiates. CBC within normal limits except for white blood count of 15.3. Urinalysis is not suggestive of urinary tract infection.   EKG: Normal sinus rhythm, normal EKG.   MENTAL STATUS EXAMINATION: Alert and oriented to person, place, time and situation. He is pleasant, polite and cooperative, rather upbeat and talkative. He is in a hospital bed wearing a hospital gown. He maintains good eye contact. His speech is of normal rhythm, rate and volume. Mood is fine with full affect. Thought processing is logical and goal oriented. Thought content: He denies suicidal or homicidal ideation. There are no delusions or paranoia. Perception: There are no auditory or visual hallucinations. His cognition is grossly intact. He registers three out of three and recalls three out of three objects after five minutes. He can spell "world" forwards and backwards. He knows the current president. His insight and judgment are questionable.   DIAGNOSES:  AXIS I: Alcohol dependence; alcohol intoxication; cocaine abuse; opioid abuse; mood disorder, not otherwise specified.  AXIS II: Deferred.  AXIS III: Hypertension, dyslipidemia, history of transient ischemic attacks, Barrett esophagus.  AXIS IV: Mental illness, substance abuse, primary support, financial and physical problems.  AXIS V: GAF 45.   PLAN:  1.  Please continue CIWA protocol.  2.  The patient adamantly refuses substance abuse treatment program  participation. He does not think that he needs detox since he has only been drinking briefly.  3.  He is asking for Seroquel at night. We will prescribe that for sleep and mood stabilization.  4.  I will follow up if necessary.    ____________________________ Wardell Honour Bary Leriche, MD jbp:np D: 08/01/2013 20:36:15 ET T: 08/01/2013 21:12:27 ET JOB#: [id number]  cc: Zayven Powe B. Bary Leriche, MD, <Dictator> Clovis Fredrickson MD ELECTRONICALLY SIGNED 08/06/2013 15:05

## 2015-04-13 NOTE — H&P (Signed)
PATIENT NAME:  Ian [name], Ian [name] MR#:  [id number] DATE OF BIRTH:  1961/01/05  DATE OF ADMISSION:  12/12/2012  REFERRING PHYSICIAN: Dr. Lenise Arena.   ATTENDING PHYSICIAN:  Dr. Orson Slick.   IDENTIFYING DATA: The patient is a 54 year old male with history of depression and alcoholism.   CHIEF COMPLAINT: "I need help."   HISTORY OF PRESENT ILLNESS: The patient was discharged from [facility] in the mid of November following alcohol detox. He reports good compliance with medication and no drinking whatsoever since discharge.  He came originally to the Emergency Room complaining of bloody vomit. He has a history of Barrett's esophagus that has not been treated and the patient is increasingly anxious about getting cancer. When not admitted to the medical floor, he stated that he was suicidal and will kill himself over Christmas if not admitted to psychiatry. He complains of abdominal pain and pain in his the legs of unknown cause, for which he demands pain killers. He also complains of heightened anxiety and feeling shaky, for which he demands benzodiazepines. They will not be granted as the patient has a history of alcohol and prescription pill abuse. He reports poor sleep, decreased appetite, anhedonia, feeling of guilt, hopelessness, worthlessness, social isolation, poor energy and concentration, panic attacks and suicidal thoughts but without a plan. He denies illicit substance or alcohol use. There are no symptoms suggestive of bipolar mania.   PAST PSYCHIATRIC HISTORY: Several psychiatric hospitalizations, probably 5 at [facility] and here, mostly for alcohol detox. He has a history of suicide attempt by cutting  wrists. He has been compliant with his appointments at [facility] and [place] for therapy. His therapist has been trying to help the patient establish care with a medical provider to take care of his GI needs. They were unsuccessful.   FAMILY PSYCHIATRIC HISTORY:  There is an uncle with schizophrenia, no known suicides.   MEDICAL HISTORY: Hypertension, Barrett's esophagus, GERD.    ALLERGIES: CELEXA, TRAZODONE, VICODIN.   MEDICATIONS ON ADMISSION: The patient reports good compliance with discharge medications: Tramadol 50 mg every 6 hours for pain, trazodone 300 mg at night, Seroquel 25 mg 3 times daily, Nexium 40 mg twice daily, Seroquel 100 mg at bedtime, Neurontin 600 mg 3 times daily, lisinopril 10 mg daily, Lexapro 20 mg daily, but on admission in the Emergency Room he listed no current medications.   SOCIAL HISTORY: He is originally from [place], dropped out of school at the age of 65 to get married. He has been married twice, has 3 children from his first marriage. They are not close. 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 He applied for disability. He has Medicaid but has not been able to identify a provider in our area.   REVIEW OF SYSTEMS: CONSTITUTIONAL: No fevers or chills. No weight changes.  EYES: No double or blurred vision.  ENT: No hearing deficits.  RESPIRATORY: No shortness of breath or cough.  CARDIOVASCULAR: No chest pain or orthopnea.  GASTROINTESTINAL: No abdominal pain, nausea, vomiting or diarrhea.  GENITOURINARY:  No incontinence or frequency.  ENDOCRINE: No heat or cold intolerance.  LYMPHATIC: No anemia or easy bruising.  INTEGUMENTARY: No acne or rash.  MUSCULOSKELETAL: No muscle or joint pain.  NEUROLOGIC: No tingling or weakness.  PSYCHIATRIC: See history of present illness for details.   PHYSICAL EXAMINATION: VITAL SIGNS: Blood pressure 151/113, pulse 78, respirations 22.  GENERAL: This is a well-developed male  in no acute distress.  HEENT: The pupils are equal, round and reactive to light. Sclerae anicteric.  NECK: Supple. No thyromegaly.  LUNGS: Clear to auscultation. No dullness to percussion.  HEART: Regular rhythm and rate. No murmurs, rubs or gallops.   ABDOMEN: Soft, nontender, nondistended. Positive bowel sounds.  MUSCULOSKELETAL: Normal muscle strength in all extremities.  SKIN: No rashes or bruises.  LYMPHATIC: No cervical adenopathy.  NEUROLOGIC: Cranial nerves II through XII are intact.   LABORATORY DATA: Chemistries are within normal limits. Blood alcohol level was not checked. LFTs within normal limits. Troponin I less than 0.02. CBC within normal limits. EKG normal sinus rhythm, normal EKG.   MENTAL STATUS EXAMINATION ON ADMISSION: The patient is alert and oriented to person, place, time and situation. He is irritable and marginally cooperative. He demands medications for pain and anxiety.  He overturned a table in the dining area trying to get our attention. He is well groomed. He wears hospital scrubs. He maintains good eye contact. His speech is of normal rhythm, rate and volume. Mood is depressed with flat affect. Thought processing is logical and goal oriented. THOUGHT CONTENT: He denies suicidal or homicidal ideation but was admitted after voicing suicidal threats to kill himself over the holidays. He states again that if discharged to home for Christmas he will kill himself, no plan.  There are no delusions or paranoia. There are no auditory or visual hallucinations. His cognition is grossly intact. He registers 3 out of 3 and recalls 3 out of 3 objects after 5 minutes. He can spell "world" forwards and backwards. He knows the current president. His insight and judgment are questionable.   SUICIDE RISK ASSESSMENT ON ADMISSION: This is a patient with a long history of mood instability and substance abuse, who came to the hospital making suicidal threats in the context of treatment noncompliance.   DIAGNOSES: AXIS I:  Mood disorder not otherwise specified, alcohol dependence in early remission.  AXIS II: Deferred.  AXIS III:  Hypertension, Barrett's esophagus.  AXIS IV: Mental illness, physical illness, access to care, employment,  financial, housing.  AXIS V: GAF on admission 25.   PLAN: The patient was admitted to [facility] for safety, stabilization and medication management. He was initially placed on suicide precautions and was closely monitored for any unsafe behaviors. He underwent full psychiatric and risk assessment. He received pharmacotherapy, individual and group psychotherapy, substance abuse counseling, and support from therapeutic milieu.  1.  Suicidal ideation: The patient is still suicidal, able to contract for safety in the hospital.  2. Mood: We will continue all medications as prescribed by Dr. Weber Cooks at discharge, includes Celexa and Seroquel.  3.  Medical. We will continue antihypertensives and monitor blood pressure.  4. GI: The patient has no provider in the community. We think that this is because his Medicaid  is not managed.  We will try to call [facility] to see if a switch is possible. They will not be open until Friday of this week.  5.  Pain: He will be maintained on tramadol as during previous hospitalization.  6.  Excessive anxiety. I will offer hydroxyzine. No benzodiazepines will be given.   DISPOSITION: He will return home when no longer suicidal.   ____________________________ Wardell Honour. Bary Leriche, MD jbp:cs D: 12/13/2012 16:55:58 ET T: 12/13/2012 19:56:57 ET JOB#: [id number]  cc: Jolanta B. Bary Leriche, MD, <Dictator> Clovis Fredrickson MD ELECTRONICALLY SIGNED 12/23/2012 23:52

## 2015-04-13 NOTE — Discharge Summary (Signed)
PATIENT NAME:  [name], Ian MR#:  [id number] DATE OF BIRTH:  10-Sep-1961  DATE OF ADMISSION:  08/01/2013 DATE OF DISCHARGE:  08/02/2013  PRESENTING COMPLAINT: Chest pain and generalized numbness, more on the left side.   DISCHARGE DIAGNOSES:  1. Chest pain, resolved. Workup negative for acute myocardial infarction.  2. Left-sided numbness. CT head negative for cerebrovascular accident.  3. Polysubstance abuse with cocaine, alcohol and tobacco.  4. Depression and anxiety.  5. History of Barrett's esophagus with possible abnormal biopsy for suspected esophageal malignancy. The patient to follow up with [facility].   CODE STATUS: Full code.   MEDICATIONS:  1. Lisinopril 20 mg daily.  2. Gabapentin 300 mg 2 capsules 3 times a day.  3. S-citalopram 20 mg daily.  4. Seroquel 300 mg p.o. at bedtime.  5. Seroquel 25 mg 1 tablet 3 times a day for anxiety.  6. Lorazepam 1 mg 3 times a day p.r.n.  7. Hydroxyzine hydrochloride 50 mg 1 tablet 3 times a day p.r.n. for anxiety.  8. Omeprazole 40 mg b.i.d.  9. Aspirin 325 p.o. daily.  10. Multivitamin daily.   FOLLOWUP:  1. Follow up with [facility] in 1 to 2 weeks.  2. Follow up with your PCP in 1 to 2 weeks.   LABORATORY DATA:  Cardiac enzymes x3 negative.  Echo Doppler showed EF of 55% to 60%. Normal left ventricular systolic function. Mild tricuspid regurgitation.  CT chest, abdomen and pelvis: Negative for acute PE or thoracic aortic pathology. No evidence of pneumothorax or pneumomediastinum. CT abdomen and pelvis: No acute abnormality noted. CT of the head without contrast shows no acute intracranial process.  EKG showed normal sinus rhythm.  CBC within normal limits except white count of 15.3.  Comprehensive metabolic panel within normal limits.  Serum ethanol is 0.159.  Urine drug screen positive for cocaine, opiates.   BRIEF SUMMARY OF HOSPITAL COURSE:  1. Ian [name] is a 54 year old Caucasian gentleman with history of long-standing  history of polysubstance abuse, who comes in with left-sided weakness and multiple nonspecific symptoms. He was admitted with left-sided numbness, which resolved. He is quite a poor historian. He ambulated well with PT. PT recommended rehab versus home health. The patient adamantly refused when asked to set home health and reported he would not allow anybody in his house. CT head was negative for CVA. No focal neuro deficits were noted. The patient was started on aspirin, continued the same. He had extensive CVA workup in 2013. Did not repeat the workup at this time.  2. Cocaine abuse and counseled the patient, advised to stay away from it.  3. Continued tobacco abuse, was kept on nicotine patch.  4. Hypertension, on lisinopril.  5. Acute alcohol intoxication. The patient was on CIWA. He did have some anxiety. Dr. Bary Leriche saw patient.  6. Esophageal cancer. The patient is supposed to go to [facility]. The patient's sister is going to take him for the appointment.  7. Depression and anxiety. Continue Seroquel. The patient was seen by Dr. Bary Leriche regarding his substance abuse. He is not interested in any outpatient rehab.   TIME SPENT: 40 minutes.   ____________________________ Hart Rochester Posey Pronto, MD sap:OSi D: 08/03/2013 06:49:05 ET T: 08/03/2013 07:19:07 ET JOB#: [id number]  cc: Chapman Matteucci A. Posey Pronto, MD, <Dictator> Ilda Basset MD ELECTRONICALLY SIGNED 08/12/2013 5:41

## 2015-04-13 NOTE — Discharge Summary (Signed)
PATIENT NAME:  Ian [name], Ian [name] MR#:  [id number] DATE OF BIRTH:  Jun 17, 1961  DATE OF ADMISSION:  04/27/2013 DATE OF DISCHARGE:  05/02/2013  HOSPITAL COURSE: See dictated history and physical for details of admission. This 54 year old man with a history of alcohol dependence and depression was admitted to the hospital requiring detox, also reporting multiple symptoms of depression. He took several days to complete detox treatment, staying for the most part, isolated to his room. Eventually he did become more physically active and attended some groups. The patient remained fixated on worrying about his physical problems. He certainly does have some medical issues, but also has not been compliant with appropriate follow up. Treatment team work on trying to encourage him to be more proactive and actually taking care of his medical problems once he was discharged. The patient did not report any suicidal ideation or behave in a dangerous or aggressive manner during that time he was in the hospital. It was suggested to him that he consider going for rehab treatment, as he in the past, but he declines the opportunity and preferred to go home with outpatient treatment in the community.   LABORATORY RESULTS: Chemistry panel showed a sodium on admission slightly low at 134, AST elevated at 43. Alcohol level on presentation, 186. CBC showed a white count elevated at 11.8, otherwise normal. Salicylates elevated at 4.0, but not toxic. Cocaine positive in the drug screen. Urinalysis unremarkable.   DISCHARGE MEDICATIONS: Lisinopril 20 mg per day, carbamazepine 200 mg twice a day, gabapentin 600 mg 3 times a day, trazodone 300 mg at night, Lexapro 20 mg once a day, quetiapine 25 mg 3 times a day and 300 mg at night, hydroxyzine 50 mg 3 times a day as needed, omeprazole 40 mg once a day.   MENTAL EXAMINATION AT DISCHARGE: Casually dressed, adequately groomed man who looks older than his stated age. Cooperative with the  interview. Eye contact normal, psychomotor activity normal. No tremor noticed. Speech normal in rate, tone and volume. Affect mildly dysphoric, mildly anxious, but controlled. Denies suicidal or homicidal ideation. Denies any hallucinations or delusions. Shows improved insight and judgment. Normal intelligence.   DISPOSITION: Discharge home. Follow up with [facility].   DISCHARGE DIAGNOSIS, PRINCIPAL AND PRIMARY:  AXIS I: Alcohol dependence.   SECONDARY DIAGNOSES: AXIS I:  1.  Cocaine abuse.  2.  Depression, not otherwise specified.   AXIS III: Alcohol withdrawal, possible history of seizure disorder, chronic orthopedic pain, high blood pressure, history of Barrett's esophagus, possible gastric cancer by his report.   AXIS IV: Severe from social isolation and continued substance abuse.   AXIS V: Functioning at time of discharge 54.   ____________________________ [name], MD jtc:cc D: 05/16/2013 21:40:00 ET T: 05/16/2013 22:46:00 ET JOB#: [id number]  cc: [name], MD, <Dictator> [name] MD ELECTRONICALLY SIGNED 05/17/2013 9:26

## 2015-04-13 NOTE — H&P (Signed)
PATIENT NAME:  Ian [name], Ian [name] MR#:  [id number] DATE OF BIRTH:  18-Sep-1961  DATE OF EVALUATION:  04/27/2013  IDENTIFYING INFORMATION AND CHIEF COMPLAINT: This is a 54 year old man with a history of alcohol dependence and substance abuse and mood problems, who presents to the Emergency Room, stating that he is in need detox and having suicidal ideas.   CHIEF COMPLAINT: "I messed up again."   HISTORY OF PRESENT ILLNESS: Information obtained from the patient and the chart. The patient reports that he has been back to drinking heavily for about the last week or two. He cannot even estimate the amount that he has been drinking. Also admits that he has used some cocaine at times. His mood has been bad. Feels depressed and hopeless. Says that he just got some bad news recently that the cancer that he allegedly has in his esophagus has spread to his stomach. He gives this as a justification for drinking more heavily. He says that he has been taking his psychiatric medicines, but has not been following up with (a doctor.) <<a doctor>> . He expresses a contempt for and lack of faith in his psychiatric treatment. He continues to have major stress in his life, living alone, pretty aimless, not following up with his medical problems.   PAST PSYCHIATRIC HISTORY: Multiple previous admissions for detox, long history of alcohol dependence. Also a history of mood instability, depression, and suicidal ideation in the past. No real history of mania. Does seem to have improved with a combination at times of mood stabilizers and antidepressants. No clear psychotic disorder.   SOCIAL HISTORY: The patient lives alone. He does have family in the area, especially a sister, but he does not like to get involved with her. His justification is that he does not want to bother her, although I think a lot of it is that he does not want to have to listen to her try to improve him. The patient otherwise has a pretty solitary lifestyle.    MEDICAL PROBLEMS:  He was diagnosed with Barrett's esophagus some time ago. He has interpreted this as being that he has cancer. He has fixated on the idea that he has esophageal cancer now for many months. The fact that it has not progressed and he does not look any sicker to me would argue against that, but he really has not gone for any of the recommended followup for it. Now, he claims that he was told that he has cancer cells in his stomach. He complains of chronic pain, a lot of which is vague, some of which is located in his stomach. Gastric reflux symptoms. High blood pressure.   CURRENT MEDICATIONS: The list that we have would suggest that he takes carbamazepine  200 mg twice a day, Lexapro 20 mg per day, Nexium 40 mg twice a day, gabapentin 300 mg 3 times a day, Zestril 10 mg per day, Ultram 50 mg q.6 hours p.r.n. pain, quetiapine 100 mg at night and 25 mg 4 times a day as needed.   ALLERGIES: ALLEGEDLY CELEXA, ALTHOUGH APPARENTLY NOT CROSS-REACTIVE WITH LEXAPRO. ALSO, TRAZODONE AND VICODIN.   REVIEW OF SYSTEMS:  Complains of pain in his abdomen, depression and anxiety, passive suicidal ideation, hopelessness. Denies any psychotic symptoms.   MENTAL STATUS EXAMINATION: Disheveled man who looks older than his stated age. Passively cooperative with the interview. Intermittent eye contact. Psychomotor activity a bit fidgety. Speech is normal in rate and tone. Affect is blunted, dysphoric. Mood stated as depressed.  Thoughts are scattered, but not psychotic. Denies auditory or visual hallucinations. Denies suicidal or homicidal ideation. Judgment and insight chronically poor. Intelligence probably average to low average.   PHYSICAL EXAMINATION: GENERAL: The patient is disheveled, not very shaky, does not show a gross tremor. He is fidgety. No acute skin lesions identified.  HEENT: Pupils equal and reactive. Face symmetric. Oral mucosa dry. Neck and back: Not tender to light touch. Gait is  slightly arthralgic. Strength and reflexes are normal and symmetric throughout. Cranial nerves normal and symmetric.  LUNGS: Clear with no wheezes.  HEART: Regular rate and rhythm.  ABDOMEN: Soft, nontender, normal bowel sounds. VITAL SIGNS:  Blood pressure 145/90, pulse 93, respirations 20, temperature 97.5.   LABORATORY RESULTS: Drug screen positive for cocaine. TSH normal. Alcohol level at about midnight here was 186. Sodium a little low at 134, ALT elevated at 43. CBC shows a slightly high white count at 11.8.   ASSESSMENT: A 54 year old man with alcohol dependence, chronic mood instability, chronic poor self-care, treatment interference, anxiety. Presents after once again having a binge of alcohol.  He is jittery, shaky, somewhat disorganized in his thinking, has very poor resources and needs hospitalization for detox and stabilization.   TREATMENT PLAN: Admit the patient to Psychiatry. Detox orders done. Continue current medicines. He needs to be referred back to the community for followup psychiatric care and back to his medical provider as well. He has been to [place] in the recent past. It may have been helpful; we might want to consider that again if he is willing to go.   DIAGNOSIS, PRINCIPAL AND PRIMARY:  AXIS I: Alcohol dependence.   SECONDARY DIAGNOSES: AXIS I: Dysthymia.  AXIS II: Borderline narcissistic and antisocial traits.  AXIS III: Barrett's esophagus, rule out stomach cancer; chronic pain; hypertension; gastric reflux symptoms.  AXIS IV: Severe from social isolation and lack of resources.  AXIS V: Functioning at time of evaluation 29.    ____________________________ [name], MD jtc:rw D: 04/27/2013 16:33:00 ET T: 04/27/2013 17:05:38 ET JOB#: [id number]  cc: [name], MD, <Dictator> [name] MD ELECTRONICALLY SIGNED 04/27/2013 19:45

## 2015-04-13 NOTE — Discharge Summary (Signed)
PATIENT NAME:  Ian [name], Ian [name] MR#:  [id number] DATE OF BIRTH:  1961/12/15  DATE OF ADMISSION:  04/27/2013 DATE OF DISCHARGE:  05/02/2013   HOSPITAL COURSE: See dictated history and physical for details of admission. This 54 year old man with a history of alcohol dependence and depression was admitted to the hospital requiring detox, also reporting multiple symptoms of depression. He took several days to complete detox treatment, staying for the most part, isolated to his room. Eventually he did become more physically active and attended some groups. The patient remained fixated on worrying about his physical problems. He certainly does have some medical issues, but also has not been compliant with appropriate follow up. Treatment team work on trying to encourage him to be more proactive and actually taking care of his medical problems once he was discharged. The patient did not report any suicidal ideation or behave in a dangerous or aggressive manner during that time he was in the hospital. It was suggested to him that he consider going for rehab treatment, as he in the past, but he declines the opportunity and preferred to go home with outpatient treatment in the community.   LABORATORY RESULTS: Chemistry panel showed a sodium on admission slightly low at 134, AST elevated at 43. Alcohol level on presentation, 186. CBC showed a white count elevated at 11.8, otherwise normal. Salicylates elevated at 4.0, but not toxic. Cocaine positive in the drug screen. Urinalysis unremarkable.   DISCHARGE MEDICATIONS: Lisinopril 20 mg per day, carbamazepine 200 mg twice a day, gabapentin 600 mg 3 times a day, trazodone 300 mg at night, Lexapro 20 mg once a day, quetiapine 25 mg 3 times a day and 300 mg at night, hydroxyzine 50 mg 3 times a day as needed, omeprazole 40 mg once a day.   MENTAL EXAMINATION AT DISCHARGE: Casually dressed, adequately groomed man who looks older than his stated age. Cooperative with the  interview. Eye contact normal, psychomotor activity normal. No tremor noticed. Speech normal in rate, tone and volume. Affect mildly dysphoric, mildly anxious, but controlled. Denies suicidal or homicidal ideation. Denies any hallucinations or delusions. Shows improved insight and judgment. Normal intelligence.   DISPOSITION: Discharge home. Follow up with [facility].   DISCHARGE DIAGNOSIS, PRINCIPAL AND PRIMARY:  AXIS I: Alcohol dependence.   SECONDARY DIAGNOSES: AXIS I:  1.  Cocaine abuse.  2.  Depression, not otherwise specified.   AXIS III: Alcohol withdrawal, possible history of seizure disorder, chronic orthopedic pain, high blood pressure, history of Barrett's esophagus, possible gastric cancer by his report.   AXIS IV: Severe from social isolation and continued substance abuse.   AXIS V: Functioning at time of discharge 54.   ____________________________ [name], MD jtc:cc D: 05/16/2013 21:40:30 ET T: 05/16/2013 22:46:00 ET JOB#: [id number]  cc: [name], MD, <Dictator>

## 2015-04-13 NOTE — H&P (Signed)
PATIENT NAME:  Ian [name], HOVATER MR#:  [id number] DATE OF BIRTH:  [birth year]  DATE OF ADMISSION:  08/01/2013  PRIMARY CARE PHYSICIAN: Nonlocal.   REFERRING PHYSICIAN: Dr. Conni Slipper.   CHIEF COMPLAINT: Left-sided weakness, chest pain.   HISTORY OF PRESENT ILLNESS: Ian [name] is a 54 year old male with history of hypertension, hyperlipidemia, history of polysubstance abuse, especially cocaine. Has been experiencing a tingling sensation on the left side of the body for the last 1 week. The patient helps on the golf course. About 4 days back experienced numbness on the left side; however, the patient continued to carry the things. The patient could not give the timelines. The patient went to bed, started to experience complete weakness on the left side of the body. The patient also started to experience chest pain and had multiple episodes of vomiting. Workup in the Emergency Department with CT head without contrast, no acute intracranial abnormality.   PAST MEDICAL HISTORY:  1. Previous history of TIA.  2. Alcohol abuse.  3. Hepatitis C.  4. Depression.  5. Anxiety.  6. Barrett's esophagus, caused to have esophageal cancer. Has not been followed by [facility]  7. Hypertension.  8. Peptic ulcer disease.  9. Prostate cancer.  10. Gastroesophageal reflux disease.  11. Appendectomy.  12. Cholecystectomy.  13. Right wrist surgery.   ALLERGIES:  1. VICODIN.  2. CELEXA.   HOME MEDICATIONS:  1. Quetiapine 300 mg once a day.  2. Quetiapine 25 mg 3 times a day.  3. Omeprazole 40 mg 2 times a day.  4. Lorazepam 1 mg 3 times a day.  5. Lisinopril 20 mg once a day.  6. Hydroxyzine 1 tablet 3 times a day.  7. Gabapentin 600 mg 3 times a day.  8. Escitalopram 20 mg once a day.   SOCIAL HISTORY: Continues to smoke 1 pack a day. Drinks alcohol heavily but not on a daily basis. Uses cocaine, last use was 4 days back, at the starting of his symptoms. Lives by himself.   FAMILY HISTORY: Both  parents died of cancer. Father died of leukemia. Mother died of breast and liver cancer.   REVIEW OF SYSTEMS:  CONSTITUTIONAL: Generalized weakness.  EYES: Has decreased vision in the left eye.   EARS: No change in hearing.  RESPIRATORY: Has mild cough. No shortness of breath.  CARDIOVASCULAR: Has chest pain. No pedal edema.  SKIN: No rash or lesions.  MUSCULOSKELETAL: No joint pains or weakness.  ENDOCRINE: No polyuria or polydipsia.  HEMATOLOGIC: No easy bruising or bleeding.  NEUROLOGIC: Has weakness on the left side of the body.   PHYSICAL EXAMINATION:  GENERAL: This is a well-built, well-nourished, age-appropriate male lying down in the bed, not in distress.  VITAL SIGNS: Temperature 98.3, pulse 71, blood pressure 144/87, respiratory rate of 20, oxygen saturation is 97% on room air.  HEENT: Head normocephalic, atraumatic. There is no scleral icterus. Conjunctivae normal. Pupils equal and react to light. Mucous membranes: Mild dryness. No pharyngeal erythema.  NECK: Supple. No lymphadenopathy. No JVD. No carotid bruit. No thyromegaly.  CHEST: Has no focal tenderness.  LUNGS: Bilaterally clear to auscultation.  HEART: S1, S2 regular. No murmurs are heard. No pedal edema. Pulses 2+.  ABDOMEN: Bowel sounds present. Soft, nontender, nondistended. No hepatosplenomegaly.  SKIN: No rash or lesions.  MUSCULOSKELETAL: Could not examine secondary to the patient's left-sided weakness.  NEUROLOGIC: The patient is alert, oriented to place, person and time. Has mild left facial droop. Motor 0 to 1/5. Decreased  sensation.   LABS: CBC: WBC 15.3, hemoglobin 17.8, platelet count of 196.   CMP is completely within normal limits.   Alcohol level of 154. PT 12.7. INR of 0.9. Troponin less than 0.02. TSH 1.25.   ASSESSMENT AND PLAN: Ian [name] is a 54 year old male who comes to the Emergency Department with left-sided weakness.  1. Left-sided cerebrovascular accident: Very highly concerning about  the cocaine use; however, the patient has other multiple risk factors of continued tobacco use. The patient has high propensity to develop accelerated atherosclerotic disease from the cocaine use as well. Admit the patient to a monitored bed. Will obtain MRI of the brain. Will obtain echocardiogram and carotid Dopplers. Keep the patient n.p.o. as the patient failed bedside swallow evaluation. Keep the patient on aspirin 325 mg daily. Obtain lipid profile. Start the patient on statin. Will involve physical therapy and occupational therapy as well as speech therapy.  2. Cocaine use: Counseled with the patient. The patient states he regrets it now.  3. Continued tobacco use: Will keep the patient on nicotine patch.  4. Hypertension: Currently moderately controlled. Will not start on any medication.  5. Keep the patient on deep vein thrombosis prophylaxis with Lovenox.  6. Esophageal cancer: The patient would benefit getting following up as an outpatient with [facility].   TIME SPENT: 50 minutes.   ____________________________ Monica Becton, MD pv:gb D: 08/01/2013 01:03:36 ET T: 08/01/2013 04:09:22 ET JOB#: [id number]  cc: Monica Becton, MD, <Dictator> Monica Becton MD ELECTRONICALLY SIGNED 08/24/2013 21:55

## 2015-04-13 NOTE — Consult Note (Signed)
Brief Consult Note: Diagnosis: alcohol dependence.   Patient was seen by consultant.   Orders entered.   Comments: Psychiatry: Patient seen. Admit to [facility] for detox.  Electronic Signatures: [name] (MD)  (Signed 07-May-14 16:59)  Authored: Brief Consult Note   Last Updated: 07-May-14 16:59 by [name] (MD)

## 2015-04-13 NOTE — Consult Note (Signed)
Brief Consult Note: Diagnosis: Alcohol dependence, alcohol intoxication.   Patient was seen by consultant.   Consult note dictated.   Recommend further assessment or treatment.   Orders entered.   Comments: Mr. Shelton has a h/o alcoholism. He was hospitalized at [facility] until 12/19/2012 for alcohol detox. He relapsed immeduately. BAL over 200 today.  PLAN: 1. The patient was refered to [place] rehab facility. Transfer as soon as bed is available.  2. We restarted all his medications as at the time of discharge.  3. I do not believe he needs detox. VS are stable. Will monitor.   4. I will follow up..  Electronic Signatures: Orson Slick (MD)  (Signed 03-Jan-14 14:19)  Authored: Brief Consult Note   Last Updated: 03-Jan-14 14:19 by Orson Slick (MD)

## 2015-04-14 NOTE — Consult Note (Signed)
PATIENT NAME:  Ian [name], Ian [name] MR#:  [id number] DATE OF BIRTH:  11/16/1961  AGE:  54 years  SEX:  Male  RACE:  White  DATE OF DICTATION: 02/04/2014  PLACE OF DICTATION:  [facility], [place], [place] OF CONSULTATION:  02/04/2014  CONSULTING PHYSICIAN:  Jillane Po K. Alicja Everitt, MD  SUBJECTIVE:  The patient was seen in consultation in [place].  The patient is a 54 year old white male, not employed, not married, and lives by himself in a trailer.  The patient has a long history of mental illness, and along with that, he reports that he was started by Dr. Leonides Schanz of Seroquel 100 mg in the morning, 100 mg at noon, and 300 mg at bedtime, and this made him drowsy. He reports that he starts drinking alcohol and using cocaine to cope with the same.  OBJECTIVE:  Dressed in hospital scrubs. Alert and oriented, competent and cooperative. Affect is sad, with mood depressed. Admits to being low and down, depressed about life in general and the way things are going on, and not feeling well enough. No internal stimuli,  but does admit to having suicidal wishes and thoughts. Does not contract for safety. Insight and judgment guarded.  IMPRESSION;  Major depressive disorder,  recurrent suicidal ideas.   RECOMMEND:  Inpatient hospital psychiatry for further help as needed for his depression and substance abuse.      ____________________________ Wallace Cullens. Franchot Mimes, MD skc:mr D: 02/04/2014 20:13:15 ET T: 02/04/2014 20:47:16 ET JOB#: [id number]  cc: Arlyn Leak K. Franchot Mimes, MD, <Dictator> Dewain Penning MD ELECTRONICALLY SIGNED 02/05/2014 20:33

## 2015-04-14 NOTE — Consult Note (Signed)
Brief Consult Note: Diagnosis: Right leg radiculopathy, depression, smoking and alc abuse.   Patient was seen by consultant.   Consult note dictated.   Orders entered.   Comments: 52y/oM with barretts esophagitis, Gastroesophageal Reflux Disease (Heartburn), smoking, alc abuse, depression adn anxiety admitted to [facility] for depression trt. Medical consult requested for right leg pain  * right leg pain- radiculopathy radiating from lower back. SLR + lumbar spine xray, likely DDD started prednisone taper, use NSAIDS prn  * Depression adn anxiety- management per psych on celexa, seroquel and xanax  * Tobacco use disorder- nicotine patch and inh  * Alc abuse- stable, no withdrawals. management per psych  * Barretts esophagitis with ? high grade dysplasia- follow up with [facility], surgery recommended per patient and also last EGD 2 weeks ago. on protonix.  Electronic Signatures: Gladstone Lighter (MD)  (Signed 18-Feb-15 15:42)  Authored: Brief Consult Note   Last Updated: 18-Feb-15 15:42 by Gladstone Lighter (MD)

## 2015-04-14 NOTE — Consult Note (Signed)
PATIENT NAME:  Ian [name], Ian [name] MR#:  [id number] DATE OF BIRTH:  1961-05-31  DATE OF CONSULTATION:  02/08/2014  REFERRING PHYSICIAN:   CONSULTING PHYSICIAN:  Gladstone Lighter, MD  PRIMARY CARE PHYSICIAN:  At [facility].   REASON FOR CONSULTATION:  Right leg pain.   HISTORY OF PRESENT ILLNESS:  Ian [name] is a 54 year old Caucasian male with past medical history significant for Barrett's esophagitis with high grade dysplasia on most recent endoscopy, diet-controlled hypertension, ongoing smoking, alcohol abuse who also has depression, presents to the hospital secondary to major depression with suicidal ideation and also alcohol withdrawal.  He is being admitted to behavioral medicine service.  The patient has been having low back pain radiating down his right leg in the hamstring region all the way to his fifth toe.  It is a sharp pain, worsens with movement and sometimes with sudden activity as well.  Going on for 4 to 5 months.  No relieving or exacerbating factors.  He did have a history of DVT in the past and was on anticoagulation, so Dopplers were ordered which were negative and medicine consult has been requested for the leg pain.  The pain does not affect his ambulation.  No fevers or chills are present.   PAST MEDICAL HISTORY: 1.  Major depressive disorder.  2.  Diet-controlled hypertension.  3.  Anxiety.  4.  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  5.  Gastroesophageal reflux disease.  6.  Barrett's esophagitis with possible esophageal cancer, following up at [facility].   PAST SURGICAL HISTORY: 1.  Multiple endoscopies.  2.  Radiation seed implant for prostate cancer.  3.  Cholecystectomy.   ALLERGIES TO MEDICATIONS:  1.  VICODIN.  2.  TRAZODONE.  3.  INTOLERANT TO CELEXA.   CURRENT MEDICATIONS IN THE HOSPITAL:  1.  Nicotine patch 21 mg daily.  2.  Ventolin inhaler 2 puffs q. 4 hours while awake.  3.  Protonix 40 mg by mouth q.a.m.  4.  Seroquel 300 mg  at bedtime.  5.  Seroquel 100 mg in the afternoon.  6.  Seroquel 100 mg in the morning.  7.  Maalox 30 mL q. 4 hours as needed for indigestion.  8.  Milk of magnesia 30 mL at bedtime as needed for constipation.  9.  Multivitamin 1 tablet by mouth daily.  10.  Xanax 0.5 mg q. 8 hours as needed for anxiety.  11.  Celexa 40 mg by mouth daily.  12.  Lisinopril 20 mg by mouth daily.  13.  Nicotrol inhaler.  14.  Thiamin 50 mg by mouth daily.   SOCIAL HISTORY:  The patient was living at home by himself prior to admission.  He smokes about 1 pack per day and drinks at least 12 to 13 beers every day.    FAMILY HISTORY:  Cancer runs big time in the family.  Mom had liver cancer.  Dad died from leukemia and sister with breast cancer, currently in remission.   REVIEW OF SYSTEMS:  CONSTITUTIONAL:  No fatigue, fever or weight gain or weight loss.  EYES:  Positive for blurry vision.  Uses reading glasses, not prescription ones.  No glaucoma or cataracts and no inflammation.  EARS, NOSE, THROAT:  No tinnitus, ear pain, hearing loss, epistaxis or discharge.  Positive for dysphagia.  RESPIRATORY:  Positive for dyspnea on exertion and also chronic cough, positive for likely underlying COPD, but no wheezing or hemoptysis.  CARDIOVASCULAR:  No chest pain, orthopnea,  edema, arrhythmia, palpitations, or syncope.  GASTROINTESTINAL:  No nausea, vomiting.  Positive for occasional diarrhea.  No abdominal pain, hematemesis or melena.  GENITOURINARY:  No dysuria, hematuria, renal calculus, frequency or incontinence.  ENDOCRINE:  No polyuria, nocturia, thyroid problems, heat or cold intolerance.  HEMATOLOGY:  No anemia, easy bruising or bleeding.  SKIN:  No acne, rash or lesions.  MUSCULOSKELETAL:  No neck pain.  No shoulder pain.  Positive for back pain radiating down the right leg.  No arthritis or gout.  NEUROLOGICAL:  Positive for numbness in the right foot and tingling.  Positive for history of stroke.  No  residual deficits. No seizures.  PSYCHOLOGICAL:  Positive for depression and anxiety.  No suicidal ideation at this time and no insomnia.   PHYSICAL EXAMINATION: VITAL SIGNS:  Temperature 97.4 degrees Fahrenheit, pulse 84, respirations 20, blood pressure 117/93, pulse ox no more than 90% on room air.  GENERAL:  Disheveled-appearing male who appears appropriate for his age, well-built, well-nourished, not in any acute distress.  HEENT:  Normocephalic, atraumatic.  Pupils equal, round, reacting to light.  Anicteric sclerae.  Extraocular movements intact.  Oropharynx clear without erythema, mass or exudates.  NECK:  Supple.  No thyromegaly, JVD or carotid bruits.  No lymphadenopathy.  LUNGS:  Moving air bilaterally.  Decreased bibasilar breath sounds.  No wheeze or crackles.  No use of accessory muscles for breathing.  CARDIOVASCULAR:  S1, S2, regular rate and rhythm.  No murmurs, rubs or gallops.  ABDOMEN:  Soft, nontender, nondistended.  No hepatosplenomegaly.  Normal bowel sounds.  EXTREMITIES:  No pedal edema.  No clubbing or cyanosis.  2+ dorsalis pedis pulses palpable bilaterally.  Straight-leg raising test is positive on the right side.  SKIN:  No acne, rash or lesions.  LYMPHATICS:  No cervical or inguinal lymphadenopathy.  NEUROLOGIC:  Cranial nerves II through XII remain intact.  No gross motor or sensory deficits.  PSYCHOLOGICAL:  The patient is awake, alert, oriented x 3.   LABORATORY DATA:  From admission, basically had an ultrasound Doppler of his bilateral lower extremities showing no evidence of any DVT.  Urine tox screen was positive for cocaine, tricyclics and also benzodiazepines on admission.  CT of the head without contrast showing no acute intracranial abnormalities.   INR 1.0, PTT 23.7.  Troponin is negative.   Chest x-ray showing no evidence of cardiopulmonary abnormality.    WBC 11.0, hemoglobin 18.4, hematocrit 55.5, platelet count 205.   Sodium 141, potassium 5.0,  chloride 107, bicarb 28, BUN 6, creatinine 0.79, glucose 84 and calcium of 8.9.   ALT 45, AST 36, alk phos 96, total bili 0.3 and albumin of 3.9.   Alcohol level was negative on admission.   RECOMMENDATION:  1.  Right leg pain, likely sciatica/radiculopathy from lumbar spine.  We will get a lumbar x-ray to assess the arthritis changes.  We will start on prednisone pack and also given nonsteroidal anti-inflammatory drugs as needed.  Dopplers have been negative for deep vein thrombosis.  This is not causing any limitation so further follow-up can be done as an outpatient if he is being discharged.  2.  Depression.  Further management per psychiatry.  He is on Seroquel at this time and also celexa at this time.   3.  Tobacco use disorder.  Placed on nicotine patch and also Nicotrol Inhaler.  4.  Alcohol abuse.  Detox per psychiatry.  Not in active withdrawals at this time.  5.  Barrett's esophagitis and  gastroesophageal reflux disease, on Protonix at this time.  We will need to follow up at [facility] for further need for chemotherapy versus surgical resection of the distal esophagus.    Total time spent on consultation is 50 minutes.    ____________________________ Gladstone Lighter, MD rk:ea D: 02/08/2014 15:37:32 ET T: 02/08/2014 16:17:50 ET JOB#: [id number]  cc: Gladstone Lighter, MD, <Dictator> Gladstone Lighter MD ELECTRONICALLY SIGNED 02/25/2014 16:21

## 2015-04-14 NOTE — H&P (Signed)
PATIENT NAME:  Ian [name], WANT MR#:  [id number] DATE OF BIRTH:  1961/12/09  DATE OF ADMISSION:  02/05/2014  DATE OF DICTATION: 02/05/2014   PLACE OF DICTATION: [place].   SEX: Male   RACE: White   AGE: 54 years   INITIAL PSYCHIATRIC EVALUATION   IDENTIFYING INFORMATION: The patient is a 54 year old white male not employed and not married and has been living by himself in a trailer. The patient has a long history of mental illness and along with that he reports that he was started by Dr. Leonides Schanz on Seroquel 100 mg in the morning, 100 mg at noon and 300 mg at bedtime, and this made him drowsy. He thought he was having a stroke and came to the Emergency Room for help and reports "here I am. They thought I was depressed and brought me here. I wanted them to check me out for my stroke."    HISTORY OF PRESENT ILLNESS: The patient reports that the medication given by Dr. Leonides Schanz made him very drowsy and he got scared he was going to have a stroke and he came here. The patient was seen in consultation in the Emergency Room by [facility] by Dr. Candiss Norse and at that time he stated he was suicidal, so he was recommended inpatient to psychiatry. The patient has had many inpatient hospitalizations psychiatry so far for detox because of long history of alcohol dependence. In addition, he has mood instability and depression and suicide ideation in the past. Had suicidal wishes and thoughts but did not have plans.. Being followed by Dr. Leonides Schanz at [facility] on outpatient basis. Apparently, some mental illness. Sister has depression. No known history of suicide in the family.   FAMILY HISTORY: He was raised by his parents. Father worked for the [employer] as a Sales executive. Father died of cancer 47 years ago. Mother died of liver cancer 5 years ago. Was a homemaker. Has 1 brother and 1 sister. Close to family. He is the youngest.   PERSONAL HISTORY: He was born in [place]. Dropped out in  11th grade. Got married, had kids and had to support them. No GED. Work history: Longest job was working for [employer] for 9 years and worked 3rd shift. Has been on disability for several years. Was diagnosed with throat cancer and Barrett's esophagitis for which he has been on disability. Military history: None. Married: Has been married but has been divorced for more than 15 years so far. Children: Has 4 children and 8 grandchildren. Not close to family. Alcohol and drugs: Drinks at the rate of 12 to 13 beers a day whenever he drinks, but he does not drink on a daily basis. Drinks in episodes. Calls himself an episodic drinker. Had 4 DWIs in the 1980s. Lost his driver's license. Has not had a driver's license since 7035. Gets around by getting ride from sister and friends. Has smoked marijuana and crack cocaine but not recently. He used to smoke these drugs because he was being around the wrong people. Now he quit being around the wrong people. Smokes nicotine cigarettes at the rate of a pack a day for many years.   MEDICAL HISTORY: Has high blood pressure. No diabetes mellitus. Status post a stroke about a year ago. Has GERD. Has Barrett's esophagitis. Status post cholecystectomy. Gets Endoscopy to keep up with  Barrett's esophagitis and check his throat for cancer. No history of motor vehicle accident or being unconscious.   ALLERGIES: TRAZODONE,  VICODIN AND CROSS REACTIVE TO CELEXA WHICH HE SAYS IS AN ALLERGY WHICH NEEDS TO BE EXPLORED.   PHYSICAL EXAMINATION:  VITAL SIGNS: Temperature is 98.4, respirations 20 per minute, pulse rate 86 per minute and regular, blood pressure 136/84 mmHg.  HEENT:  Head is normocephalic, atraumatic. Eyes: PERLA. Fundi are benign.  NECK:  Supple without any Thyromegaly.. CHEST: Normal expansion. Normal breath sounds.  HEART:  Normal S1, S2, without any murmurs or rubs. ABDOMEN:  Soft. No organomegaly. Bowel sounds heard. RECTAL: Deferred.  NEUROLOGIC: Gait  is normal. Romberg is negative. Cranial nerves II to XII grossly intact. DTRs 2+ and normal.   MENTAL STATUS EXAMINATION: The patient is dressed in street clothes. Alert and oriented to place, person. Fully aware of situation. Brought in for admission to [facility]. Affect is flat. Mood depressed. Admits to feeling low, down and depressed. He admits to suicidal wishes and thoughts at the time of admission but currently contracts for safety and no psychosis. Denies any auditory or visual hallucinations. Memory is intact. Cognition intact. General knowledge and information fair for level of education. Could count money. Insight and judgment guarded.   IMPRESSION:  AXIS I: Major depressive disorder, recurrent with suicidal ideas but contracts for safety. History of cocaine abuse and THC abuse, in remission. Alcohol dependence, chronic but calls himself episodic drinker.  AXIS II: Deferred.  AXIS III: Gastroesophageal reflux disease, hypertension, Barrett's esophagitis.  AXIS IV: Severe by physical problems and chronic alcohol abuse and chronic depression.  AXIS V: Global assessment of functioning 25.   PLAN: The patient admitted to [facility] for close observation. He will be started back on all of his home medications. In addition, will be started on antidepressant medication to help him with his depression. He will be on p.r.n. medication for withdrawal from alcohol as needed. During his stay in the hospital, he will be given coping skills and supportive counseling. He will take part in individual and group therapy. Coping skills in dealing with stressors of life will be addressed. At the time of discharge, the patient will be stabilized and appropriate followup appointment will be made.      ____________________________ Wallace Cullens. Franchot Mimes, MD skc:gb D: 02/05/2014 21:52:00 ET T: 02/06/2014 01:18:01 ET JOB#: [id number]  cc: Arlyn Leak K. Franchot Mimes, MD, <Dictator> Dewain Penning MD ELECTRONICALLY SIGNED  02/06/2014 9:25

## 2015-04-15 NOTE — Discharge Summary (Signed)
PATIENT NAME:  Ian, [name] MR#:  [id number] DATE OF BIRTH:  15-Aug-1961  DATE OF ADMISSION:  06/13/2012 DATE OF DISCHARGE:  06/15/2012  HISTORY OF PRESENT ILLNESS: [name] is a 54 year old male who presented to the Emergency Department on June 23rd just after he had presented to the Emergency Department on June 21st. Both presentations were similar in that he arrived with alcohol intoxication. By the time of the June 23rd presentation, Ian [name] presented with a blood alcohol level of 272 and impaired judgment. He had not been taking his general medical medication, particularly his hypertensive medication. He had recently required a general medical admission due to malignant hypertension.   He was experiencing the chronic stress of having the partial workup of a gastrointestinal lesion located in his distal esophagus. The information that he had received was that they were precancerous cells and that he would require an additional workup. This workup is scheduled on June 26th.  However, the patient has been experiencing catastrophic anticipatory anxiety about receiving the news. He already has experienced prostatic cancer in the past.   He was giving a history of depressed mood, low energy, poor concentration, anhedonia, poor self-esteem. He has been drinking, according to him, only two binges in the past week, the one prior to his presentation on the 21st and drinking a case of beer before presenting on the 23rd.    ANCILLARY CLINICAL DATA: After Ian [name] presented to the Emergency Department and was admitted to the [facility], his blood pressure rose independent of any other pattern of withdrawal sign. It was clear that he needed to be started on an effective antihypertensive agent. The undersigned further reviewed his general medical electronic medical record and found that the metoprolol 25 mg had been tried followed by the most effective regimen which was  [name] 80 mg daily. [name] was started with a stat dose of 80 mg followed by 80 mg daily. The next day Ian [name] had a slow drop in his blood pressure back to an acceptable level. He will have follow-up with his general medical physician. Also, he will have his blood pressure rechecked during his gastrointestinal work-up on June 26th.    HOSPITAL COURSE: Ian [name] was admitted to the [facility]. He recovered well from his alcohol intoxication. He quickly demonstrated normal mood and social appropriate behavior. He did receive psychotherapy and was able to get his anticipatory anxiety down to an acceptable level with a rational approach and scientific approach to the steps needed to evaluate his intestinal abnormality.  He was restarted on Celexa 40 mg for anti-depression/antianxiety. He realized that his tendency toward excessive worry is part of the problem when it comes to anticipating general medical evaluation. He also was given trazodone for anti-insomnia and augmenting his Celexa.   The indications, alternatives, and adverse effects of trazodone were discussed with the patient including the risk of priapism resulting in surgery-induced impotence. He understood and wanted to continue with the trazodone increased to 100 mg at bedtime given that 50 mg was not fully effective for his insomnia.   CONDITION ON DISCHARGE: By 06/15/2012, Ian [name] showed normal social behavior. His mood is normal. His hope is intact. He does still have worry about the potential news from his workup but is confident that he can handle it appropriately. He has no thoughts of harming himself or others. His orientation and memory function are intact. He has intact interest.  EXAM UPON DISCHARGE: GENERAL  APPEARANCE: Ian [name] is a well developed, well nourished middle-aged male sitting up in a chair with no abnormal involuntary movements. He has no cachexia. His muscle tone is normal. Grooming  and hygiene are normal.   MENTAL STATUS EXAMINATION: Ian [name] is alert. Eye contact is good. Concentration is normal. Abstraction is intact. He is oriented to all spheres. Memory is intact to immediate, recent, and remote. Fund of knowledge, use of language, and intelligence are normal. Speech involves normal rate and prosody without dysarthria. Thought process logical, coherent, and goal directed. No loose of associations or tangents. He has no thoughts of harming himself or others. No delusions or hallucinations. Insight intact. Judgment intact. Mood within normal limits. Anxiety slightly anxious at baseline but with a broad and appropriate range as the interview progresses.   ASSESSMENT:  AXIS I:  1. Major depressive disorder, recurrent, in partial remission. 2. Anxiety disorder, not otherwise specified. 3. Alcohol dependence. 4. Cocaine positive urine drug screen.   AXIS II: Deferred.   AXIS III:  1. History of malignant hypertension.  2. Please see the past medical history in the admission dictation.   AXIS IV: General medical.   AXIS V: 54.  Ian [name] is not at risk to harm himself or others. He agrees to call emergency services immediately for any thoughts of harming himself, thoughts of harming others, or distress. He agrees to not drive if drowsy.   DISCHARGE DIET: Regular.   ACTIVITY: Routine.   DISCHARGE MEDICATIONS: 1. Celexa 40 mg, dispense #10, 1 p.o. daily. 2. [name] 80 mg, dispense #10, 1 p.o. daily. 3. Trazodone 100 mg, dispense #10, 1 p.o. at bedtime.   FOLLOW-UP APPOINTMENT:  1. [facility] for medication management visit July 1st at 1 p.m.  2. [facility] substance abuse community support group on June 28th at 1 p.m.  3. He also has an additional scheduled follow-up appointment for med check on September 3rd at [facility].  4. He will follow-up with his general medical physician within 10 days of discharge regarding his non-GI general medical problems.  5. He has  an appointment with [facility] for his esophageal workup on June 26th.   ____________________________ Drue Stager. Keylin Ferryman, MD [id number] D: 06/16/2012 13:10:53 ET T: 06/16/2012 14:10:21 ET JOB#: [id number]  cc: Drue Stager. Brayan Votaw, MD, <Dictator> Billie Ruddy MD ELECTRONICALLY SIGNED 06/16/2012 16:52

## 2015-04-15 NOTE — Discharge Summary (Signed)
PATIENT NAME:  Ian [name], Ian [name] MR#:  [id number] DATE OF BIRTH:  November 13, 1961  DATE OF ADMISSION:  05/11/2012 DATE OF DISCHARGE:  05/13/2012  HISTORY OF PRESENT ILLNESS: Ian [name] is a 54 year old male who was brought to the Emergency Room with suicidal thoughts and depressed mood. He explained that he had just received some news from his EGD biopsy. The biopsy showed that he had cancer cells in his upper GI tract. He was crying and did acknowledge suicidal thoughts at the psychiatrist's office.  The patient was petitioned for involuntary commitment. His orientation and memory function have been intact. He has not been experiencing any hallucination. His urine drug screen was positive for cocaine.   He continues to have muscle tension and feeling on edge. He has been taking Klonopin 0.5 mg to 1 mg b.i.d. p.r.n.   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   ANCILLARY CLINICAL DATA: Ian [name] was assessed in the Emergency Room to have inflammation affecting nerve output from his lower back.   The undersigned also discovered left lower limb weakness on physical exam.   Ian [name] is undergoing a methylprednisolone taper and will have outpatient general medical follow-up for this problem as well as further evaluation and treatment of his upper GI status.   HOSPITAL COURSE: Ian [name] was admitted to the [facility] and underwent milieu and group psychotherapy. He did experience some new agitation after starting the methylprednisolone including insomnia. This required the use of Seroquel in order to provide adequate daytime calming as well as anti-insomnia.   By May 23rd, he had slept well. His mood was normal. He had constructive future goals and interests. He was socially appropriate.   MENTAL STATUS EXAM UPON DISCHARGE: Ian [name] is a well developed, well  nourished, middle-aged male with no cachexia and normal muscle tone. His grooming and hygiene are normal.   His eye contact is good. He is oriented to all spheres. Concentration is normal. Memory is intact to immediate, recent, and remote. Fund of knowledge and intelligence as well as use of language are normal. Speech involves normal rate and prosody without dysarthria. Thought process is logical, coherent, and goal directed. No looseness of associations. Thought content no thoughts of harming himself or others. No delusions or hallucinations. Affect slightly anxious. Mood normal. Insight intact. Judgment intact.   AXIS I:  1. Major depressive disorder, recurrent, improved.  2. Generalized anxiety disorder.  3. Cocaine abuse.  4. Alcohol dependence.   Ian [name] has experienced some acute reactive symptoms due to his general medical news, however, he has recovered from his acute emotional distress and wants to liver very much. He intends on following through with all recommended general medical care.   AXIS II: Deferred.   AXIS III: Upper GI cancerous cells. See the past medical history.   AXIS IV: General medical, primary support group.   AXIS V: 54.   Ian [name] is not at risk to harm himself or others. He agrees to call emergency services immediately for any thoughts of harming himself, thoughts of harming others, or distress.   The undersigned recommended that Ian [name] stay longer in the hospital given his vulnerability for relapse, however, the patient declined and he is not committable.   He wants to proceed with outpatient care and he wants to get to his general medical evaluation and treatment as soon as possible.  DISCHARGE MEDICATIONS:  1. Seroquel 25 mg one p.o. b.i.d. p.r.n. severe anxiety and 1 to 4 p.o. at bedtime p.r.n. insomnia. The indications, alternatives, and adverse effects of Seroquel were discussed with the patient including the risk of a nonreversible  movement disorder, diabetes mellitus, and weight gain as well as metabolic syndrome. The patient understands that the Seroquel is being used for catastrophic levels of anxiety and this is an off label use. The reason that Seroquel is being utilized is that the patient does have a history of severe substance dependence, therefore, addictive antianxiety agents are contraindicated. Also, the patient's anxiety level has been catastrophic and appears to have been exacerbated by the methylprednisolone. The patient understands and wants to proceed with a short-lived course of Seroquel; long enough to cover his time on the methylprednisolone taper.  2. Methylprednisolone Dose Pack to allow a total of a six-day taper.  3. Ventolin HFA 90 mcg daily. 4. Micardis 80 mg daily.  5. Nexium 40 mg daily.   FOLLOW-UP:  1. Follow-up appointment with Dr. Jacqualine Code with [facility] at 11:45 08/24/2012.  2. He will follow-up with Endocrinology on 05/26/2012 at 11 o'clock. [facility].  3. He will also follow-up with [facility] for another endoscope on June 7th.    DIET: Regular.   ACTIVITY: Routine.   12-step meetings.   ____________________________ Ian [name]. Bridgette Wolden, MD jsw:drc D: 05/13/2012 18:11:48 ET T: 05/17/2012 09:52:27 ET JOB#: [id number]  cc: Ian [name]. Syriana Croslin, MD, <Dictator>  Billie Ruddy MD ELECTRONICALLY SIGNED 05/18/2012 15:53

## 2015-04-15 NOTE — Discharge Summary (Signed)
PATIENT NAME:  Ian [name], MEADOW MR#:  [id number] DATE OF BIRTH:  03/13/1961  DATE OF ADMISSION:  02/19/2012 DATE OF DISCHARGE:  02/20/2012  PRIMARY CARE PHYSICIAN:  None at the time of admission.  The patient will be seen at the [facility] for establishment and continuation of medical care.  REASON FOR ADMISSION:  Chest pain.  DISCHARGE DIAGNOSES:  1. Chest pain felt to be of noncardiac etiology, possibly gastroesophageal reflux disease-related or related to alcohol abuse and possible gastritis/esophagitis. The patient has ruled out for myocardial infarction and underwent a negative cardiac stress test.  Chest pain free at the time of discharge. 2. Syncope, suspect vasovagal.  3. Hypertension. 4. Alcohol abuse with acute alcohol intoxication and abnormal liver function tests. 5. History of gastroesophageal reflux disease/esophagitis.  6. History of depression/anxiety. 7. Questionable history of transient ischemic attack with transient left-sided numbness in the past. History of tobacco abuse.  8. Recent admission 02/13 to 02/05/2012 for chest pain at which time the patient left against medical advice.  9. History of malignant hypertension.  10. History of unstable angina. 11. History of medical noncompliance.  12. History of possible Barrett's.   13. History of prostate cancer status post seed implantation.  14. History of appendectomy. 15. History of cholecystectomy.  CONSULTS:  None.   PROCEDURES:  1. Portable chest x-ray 02/19/2012:  No acute cardiopulmonary abnormalities. 2. Noncontrast head CT 02/19/2012: No acute intracranial abnormalities noted.  3. CT of the abdomen and pelvis without contrast 02/19/2012: No evidence of urinary tract stones or obstruction or any other acute urinary tract abnormality. No acute small or large bowel abnormality. No evidence of acute hepatobiliary abnormality. Gallbladder surgically absent. Stable mildly enlarged lymph nodes in the inferior to  the caudate lobe of the liver and anterior to the inferior vena cava. No evidence of psoas abscess or evidence of retroperitoneal mass. 4. Bilateral carotid artery Doppler ultrasound 02/19/2012:  Moderate soft plaque formation distal right common carotid, flow velocities however do not indicate the presence of hemodynamically significant stenosis. There is no hemodynamically significant stenosis on either side. There is antegrade flow noted in both vertebrals.  5. 2-D echocardiogram 02/19/2012: LV is grossly normal in size. No thrombus. LV systolic function normal. Ejection fraction greater than 55%. Normal LV wall thickness and wall motion. RV systolic function is normal.  6. LexiScan: Preliminary report negative for ischemia per Dr. Clayborn Bigness and safe for discharge of the patient home. Not formally read yet.   PERTINENT LABORATORY DATA:  Liver function tests on admission were AST 71, ALT 115.  AST 50, ALT 91 at the time of discharge. Cardiac enzymes negative times three sets. CBC normal on 02/20/2012.  Alcohol level was 0.149% on admission. Renal function normal on admission and discharge.   BRIEF HISTORY AND HOSPITAL COURSE: The patient is a 54 year old male with past medical history of alcohol abuse, tobacco abuse, questionable transient ischemic attack in the past, depression/anxiety, gastroesophageal reflux disease, hypertension, and medication noncompliance who presented to the Emergency Department for complaints of chest pain. Please see the dictated admission History and Physical for pertinent details surrounding the onset of the hospitalization. Please see below for further details.  1. Chest pain:  Felt to be of noncardiac etiology, possibly gastroesophageal reflux disease related or related to alcohol abuse and possible gastritis/esophagitis for which he has been started on PPI therapy. The patient was admitted to the hospital for further evaluation of his chest pain. He has had three negative  sets  of cardiac enzymes.  He had an echocardiogram which was benign.  He went for a LexiScan with prelim report to be negative per Dr. Clayborn Bigness and per Dr. Clayborn Bigness his chest pain is felt to be noncardiac and it was felt to be safe to have him discharged home from a cardiac  perspective.  He is chest pain free at the time of discharge. With conservative measures the patient's chest pain had resolved.  2. Syncope: Felt to be vasovagal in nature. The patient underwent a noncontrast head CT which was negative for acute intracranial abnormalities. Telemetry monitoring was benign. Echocardiogram was benign. The patient was not orthostatic or volume depleted.  He had some plaque noted on carotid Dopplers but there was no hemodynamically significant stenosis. There were no witnessed syncopal episodes since admission. Syncope is felt to be vasovagal in nature at this time.  3. Hypertension: He did have some borderline low blood pressure readings and was recently started on metoprolol. Since his blood pressure was borderline low we have stopped metoprolol, although he has had asymptomatic borderline hypotension. After stopping metoprolol, blood pressure is well controlled and at goal and he is to continue Micardis and Norvasc.  4. Alcohol abuse with acute alcohol intoxication and abnormal liver function tests likely secondary to alcoholic hepatitis: The patient was counseled on not consuming alcohol again. He states that he is not a heavy daily drinker and that he has never experienced alcohol withdrawal or DTs or any alcohol withdrawal seizures. He denied any abdominal pain, nausea, or vomiting. CT of the abdomen was benign.  The CT of the abdomen was benign other than stable lymphadenopathy noted in the past, which will need to be closely monitored as an outpatient.  The patient will repeat LFTs in one week per primary care physician, but with conservative measures his liver function tests have improved. He was also  advised to avoid hepatotoxins including alcohol and acetaminophen-containing products. 5. Gastroesophageal reflux disease/esophagitis: The patient has been started on PPI therapy and will be referred to [facility] as an outpatient and will also need to follow up biopsy results from when he had an EGD performed by Dr. Candace Cruise as there was a concern for possible Barrett's.  6. History of depression/ anxiety:  The patient is without any suicidal or homicidal ideation. The patient will follow up with [facility] for further management as an outpatient and he can use p.r.n. Klonopin at home which he was using before.  7. History of questionable transient ischemic attack in the past with transient left-sided numbness which had resolved.  The patient denied any weakness, numbness, or tingling during this hospitalization. He will continue aspirin. 8. Tobacco abuse: The patient was strongly counseled on the importance of smoking cessation.   On 02/20/2012 the patient was hemodynamically stable without any chest pain and had no witnessed syncopal episodes. He was felt to be stable for discharge home with close outpatient followup to which the patient was agreeable.  DISCHARGE DISPOSITION:  Home.  DISCHARGE CONDITION:  Improved, stable.   DISCHARGE ACTIVITY:  As tolerated.  DISCHARGE DIET: Low sodium, low fat, low cholesterol.   DISCHARGE MEDICATIONS:  1. Aspirin 81 mg daily. 2. Nexium 40 mg b.i.d.   3. Amlodipine 5 mg daily. 4. Micardis 40 mg daily. 5. Ventolin HFA 90 mcg daily.    DISCHARGE INSTRUCTIONS: 1. Take medications as prescribed. 2. Return to the Emergency Department  for recurrence of symptoms. 3. Do not consume any alcohol or take any  Tylenol or acetaminophen-containing products.   FOLLOWUP INSTRUCTIONS: 1. Follow up at the [facility] in 1 to 2 weeks. The patient needs repeat complete metabolic panel within one week and also close followup of his lymphadenopathy noted on  his CT scan with repeat imaging in the next 3 to 6 months. 2. Follow up with [facility] in 1 to 2 weeks.   REFERRALS:  The patient is referred to [facility] within 1 to 2 weeks for esophagitis and possible Barrett's.  TIME SPENT ON DISCHARGE:  Greater than 30 minutes.   ____________________________ Romie Jumper, MD knl:bjt D: 02/24/2012 17:26:08 ET T: 02/25/2012 12:32:56 ET JOB#: [id number]  cc: Romie Jumper, MD, <Dictator> [facility]  Romie Jumper MD ELECTRONICALLY SIGNED 03/03/2012 18:18

## 2015-04-15 NOTE — Discharge Summary (Signed)
PATIENT NAME:  Ian [name], PLANT MR#:  [id number] DATE OF BIRTH:  [birth year]  DATE OF ADMISSION:  04/19/2012 DATE OF DISCHARGE:  04/20/2012  For a detailed note, please take a look at the history and physical done on admission by Dr. Vianne Bulls.   DIAGNOSES AT DISCHARGE:  1. Syncope, likely vasovagal in nature.  2. Malignant hypertension secondary to medical noncompliance.  3. Anxiety.  4. History of Barrett's esophagus.   DIET: The patient is being discharged on a low sodium diet.   ACTIVITY: As tolerated.   FOLLOW-UP: Follow-up with the [facility]. The patient has a follow-up coming up on 04/27/2012 at 5 p.m.   DISCHARGE MEDICATIONS:  1. Albuterol inhaler q.4 hours as needed.  2. Micardis 80 mg daily.  3. Klonopin 1 mg half a tab to 1 tab b.i.d. as needed.  4. Nexium 40 mg daily.   PERTINENT STUDIES DONE DURING THE HOSPITAL COURSE:  1. CT scan of the head done without contrast showing no acute intracranial abnormalities.  2. Chest x-ray done on admission showing no acute changes. 3. Lumbar spine x-ray showing no fracture, narrowing of the L5-S1 intervertebral disk space.   HOSPITAL COURSE: This is a 54 year old male with medical problems as mentioned above who presented to the hospital with a syncopal episode.  1. Syncope. The patient presented to the hospital with a suspected syncopal episode. He was observed overnight on telemetry, did not have any evidence of acute arrhythmias. His cardiac markers remained negative. He had a CT scan of his head which was negative. His orthostatic vital signs were not consistent with orthostatic hypotension. The patient has had no further episodes of syncope and, therefore, is being discharged home.  2. Malignant hypertension. When the patient presented to the hospital, his systolic blood pressures were as high as 400 with diastolic over 867'Y. This is likely secondary to medical noncompliance. The patient was started on some amlodipine here in  the hospital along with some p.r.n. labetalol. His blood pressures while in the hospital have significantly improved. He was strongly advised to be compliant with his meds and discharged back on his Micardis as stated.  3. Barrett's esophagus. The patient says that he used to be on Nexium which seemed to have helped his symptoms, although he has not been taking them for a while. I did give him a two week supply for Nexium until he follows up with the [facility].  4. Chronic obstructive pulmonary disease. The patient did not have any acute COPD exacerbation. He was maintained on some p.r.n. DuoNebs along with his albuterol inhaler and some Spiriva. He is being discharged back on his albuterol as needed.  5. Anxiety. The patient is already on Klonopin. He will resume that upon discharge.   CODE STATUS: The patient is a FULL CODE.   TIME SPENT: 35 minutes.  ____________________________ Belia Heman. Verdell Carmine, MD vjs:drc D: 04/20/2012 14:32:30 ET T: 04/21/2012 10:21:25 ET JOB#: [id number]  cc: Belia Heman. Verdell Carmine, MD, <Dictator> [facility] Henreitta Leber MD ELECTRONICALLY SIGNED 04/22/2012 8:15
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
## 2015-04-15 NOTE — Consult Note (Signed)
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  Appt with thoracic surgery made through sister due to patient's unstable mental capacity.  He has hx of alcohol and cig abuse and previous cocaine abuse.  No active bleeding likely so will start clear liquids.  Likely vomited due to alcohol and irritated esophageal area since his hgb is so high.  Continue drips for 48 hours then stop octreotide and change PPI to oral twice a day.  Electronic Signatures: Manya Silvas (MD)  (Signed on 16-Jun-13 12:23)  Authored  Last Updated: 16-Jun-13 12:23 by Manya Silvas (MD)

## 2015-04-15 NOTE — H&P (Signed)
PATIENT NAME:  Ian [name], Ian [name] MR#:  [id number] DATE OF BIRTH:  08/15/1961  DATE OF ADMISSION:  02/19/2012  PRIMARY CARE PHYSICIAN: None.   HISTORY OF PRESENT ILLNESS:  The patient is a 54 year old Caucasian male with past medical history significant for recent admission to the hospital at [facility] and [place] on 02/13 to 02/14 for chest pain. He presented back to the hospital with complaints of chest pains. According to the patient, he was doing well up until the afternoon the day of admission when he started suddenly having left-sided chest pain. It felt as if truck was packed on his chest. It was 10/10 by intensity pain accompanied by shortness of breath, sweating, as well as wheezing and syncopal episode. He became also very nauseous and vomited at least 3 or 4 times. It is unclear how long his syncope lasted, but he was brought to the Emergency Room for further evaluation. Because of his recent admission to the hospital for chest pains and because of concerns of cardiac event, hospitalist services were contacted for admission.   PAST MEDICAL HISTORY:  1. History of recent admission 02/13 to 02/14//2013 for chest pains. At that time the patient left AGAINST MEDICAL ADVICE.  2. History of malignant hypertension. 3. Unstable angina.  4. Questionable transient ischemic attack with left-sided numbness. 5. Ongoing tobacco abuse. 6. Anxiety/depression. 7. Medical noncompliance.  8. History of gastroesophageal reflux disease, status post esophagogastroduodenoscopy due to hematemesis in 09/2011 which revealed LA grade 2 reflux esophagitis. Rule out Barrett's esophagus which was biopsied by Dr. Candace Cruise during esophagogastroduodenoscopy done in October 2012 as mentioned above. 9. History of prostate cancer status post seed implantation.  10. History of appendectomy.  11. Cholecystectomy.  DRUG ALLERGIES: No known drug allergies.    MEDICATIONS:   1. Formoterol inhaler twice daily.  2. Clonazepam 0.5 mg twice daily as needed.  3. Norvasc 5 mg daily. 4. Micardis 40 mg p.o. daily. 5. Zantac 150 mg twice a day. 6. Albuterol 2 puffs every four hours as needed.   SOCIAL HISTORY: He smokes a pack of cigarettes a day. He drinks approximately 40 ounces of beer a day. He tells me he does not drink daily. He drinks intermittently.   REVIEW OF SYSTEMS: Positive for feeling clammy, feverish, fatigue and weak for a while now, pains in his chest and blurring vision intermittently, cough and wheezing intermittently. Also hemoptysis, shortness of breath, syncopal episode, arrhythmias, dyspnea on exertion, as well as chest pains. Denies any weight loss or gain. EYES: In regards to eyes, denies any double vision, glaucoma, or cataracts. Denies any tinnitus, allergies, epistaxis, sinus pain, difficulty swallowing. Denies any painful respirations, asthma, or chronic obstructive pulmonary disease. CARDIOVASCULAR: Denies orthopnea or palpitations. GASTROINTESTINAL: Admits of nausea and vomiting. Denies any diarrhea or constipation. Admits of abdominal pains on the left side of his abdomen for a while now for at least one month and not able to provide much more history about that. No alleviating or relieving factors and no relation to meals. GENITOURINARY: Denies dysuria, hematuria, frequency, or incontinence. ENDOCRINOLOGY: Denies any polydipsia, nocturia, thyroid problems, heat or cold intolerance or thirst. HEMATOLOGIC: Denies anemia, easy bruising, easy bleeding or swollen glands. SKIN: Denies acne, rash or change in moles. MUSCULOSKELETAL: Denies arthritis, cramps, swelling, or gout. NEUROLOGIC: No numbness, epilepsy, or tremor. PSYCH: No anxiety, insomnia, or depression. Admits of having left-sided body numbness intermittently. Also, achiness in his muscles and joints all over his body.  PHYSICAL EXAMINATION:  VITAL SIGNS: On arrival to the hospital,  temperature is not measured, pulse 102, respiration rate 18, blood pressure 161/92, saturation 93% on room air. During my evaluation his heart rate is 87. Blood pressure 136/75, saturation is good at 96 on oxygen therapy.   GENERAL: This is a well well-developed, well-nourished Caucasian male in no significant distress, somewhat restless and anxious lying on the stretcher.   HEENT: His pupils are equal, reactive to light. Extraocular movements intact. No icterus or conjunctivitis. Has normal hearing. No pharyngeal erythema. Mucosa is moist.   NECK: No masses, supple, nontender. Thyroid is not enlarged. No adenopathy. No JVD or carotid bruits bilaterally. Full range of motion.   LUNGS: Clear to auscultation, but diminished breath sounds bilaterally. Few wheezes, especially expiratory labored inspirations whenever he moves around as well as increased effort whenever he moves around, in mild respiratory distress.   CARDIOVASCULAR: S1, S2 appreciated. No murmurs, gallops or rubs were noted. PMI not lateralized. Chest is nontender to palpation.   EXTREMITIES: 1+ pedal pulses. No lower extremity edema, calf tenderness, or cyanosis noted.   ABDOMEN: Soft, tender in the left side, but no rebound or guarding were noted. No hepatosplenomegaly or masses were noted.   RECTAL: Deferred.   MUSCULOSKELETAL: Able to move all extremities. No cyanosis, degenerative joint disease, or kyphosis. Gait is not tested.   SKIN: Skin did not reveal any rashes, lesions, erythema, nodularity, or induration. It was warm and dry to palpation. No adenopathy in the cervical region.   NEUROLOGICAL: Grossly intact. Sensory is intact. No dysarthria or aphasia. The patient is alert, oriented to time, person, and place, cooperative. Memory is good. No significant confusion, agitation, or depression noted.   LABORATORY, DIAGNOSTIC, AND RADIOLOGICAL DATA: BMP showed glucose of 103, anion gap 17. Alcohol level was 0.149%. Liver  enzymes showed AST elevation to 71, ALT 115. Cardiac enzymes, first set, negative. CBC: White blood cell count 14.6, hemoglobin 17.2, platelets 232. Absolute neutrophil count is elevated at 7.6. Coagulation panel unremarkable. Urinalysis was unremarkable. Chest portable single view 02/19/2012 showed no acute changes CT of head without contrast on 02/19/2012 showed no acute abnormality of the brain. There are findings consistent with inflammation of the ethmoid and maxillary sinuses without air-fluid levels according to the radiologist.   ASSESSMENT AND PLAN:  1. Chest pain. Admit the patient to medical floor. Start him on metoprolol, aspirin, nitroglycerin, as well as Lovenox. Check cardiac enzymes x3. Get cardiologist involved for recommendations.  2. Syncope with chest pain. As above, the patient needs to be worked up for cardiac event for cardiac disease. Of note, I cannot find the patient's EKG at this moment. EKG will be repeated. For syncope, will get echocardiogram as well as stress test for this patient as well as cardiology consultation and carotid ultrasound. Will recheck the patient's orthostatics.  3. Elevated transaminases, very likely due to alcohol abuse. The patient's elevated transaminases were already noted initially on 02/04/2012.  4. Alcohol abuse intoxication with elevated ethanol levels. Will follow the patient for withdrawal.  5. Hyperglycemia. Hemoglobin A1c was done in December 2009. At that time it was 5.4. Will not repeat at this moment.  6. Left-sided abdominal pain. The patient would benefit from CT scan of abdomen and pelvis.  7. Leukocytosis, unclear etiology at this time, possibly due to abdominal issues or stress after syncope. Will follow the patient's white blood cell count in the morning. No antibiotics at this time unless the patient's CT scan  of abdomen shows abnormalities.  8. History of hypertension. Continue the patient on Norvasc as well as Micardis.  9. History  of gastroesophageal reflux disease. Continue Zantac. 10. History of chronic obstructive pulmonary disease. Continue formoterol as well as Albuterol.  11. History of anxiety. Continue clonazepam and initiate CIWA scale if needed.   TIME SPENT: One hour.   ____________________________ Theodoro Grist, MD rv:ap D: 02/19/2012 10:23:58 ET T: 02/19/2012 10:49:04 ET JOB#: [id number]  cc: Theodoro Grist, MD, <Dictator> Derak Schurman MD ELECTRONICALLY SIGNED 02/21/2012 14:17

## 2015-04-15 NOTE — Consult Note (Signed)
Pt colonoscopy done for abd pain, also as a preop evaluation before his probable esophageal cancer surgery.  Due to his stress he would have been very difficult to clean out at home.  small polyp removed, internal hemorrhoids seen.  Electronic Signatures: Manya Silvas (MD)  (Signed on 18-Jun-13 09:48)  Authored  Last Updated: 18-Jun-13 09:48 by Manya Silvas (MD)

## 2015-04-15 NOTE — Discharge Summary (Signed)
PATIENT NAME:  Ian [name], Ian [name] MR#:  [id number] DATE OF BIRTH:  1961-10-09  DATE OF ADMISSION:  02/04/2012 DATE OF DISCHARGE:  02/05/2012   PATIENT LEFT AGAINST MEDICAL ADVICE   ADMITTING DIAGNOSIS: Malignant hypertension.   DISCHARGE DIAGNOSES:  1. Malignant hypertension  2. Unstable angina.  3. Questionable transient ischemic attack with left-sided numbness.  4. Tobacco abuse, ongoing.  5. Anxiety, depression.  6. Medical noncompliance.  7. History of gastroesophageal reflux disease. 8. Prostate cancer status post seed implants. 9. History of appendectomy and cholecystectomy.   DISCHARGE MEDICATIONS: Patient was not given any medications as he left AGAINST MEDICAL ADVICE. He is to continue:  1. Norvasc 5 mg p.o. daily. 2. Micardis 40 mg p.o. daily. 3. Zantac 150 mg twice daily.  4. Albuterol metered dose inhaler q.4 hours as needed.   NOTE: He was advised to quit smoking.   HISTORY OF PRESENT ILLNESS: Patient is a 54 year old Caucasian male with history of hypertension who presented to the hospital with complaints of chest pains as well as high blood pressure. Please refer to Dr. Edwina Barth admission note on 02/04/2012. Apparently he did not have his medications filled and was having problems with having chest tightness. He was seen at walk-in clinic and was noted to have elevated blood pressure. Was advised to come to the Emergency Room, however, he refused to go. On day of admission his blood pressure was found to be very high, more than 200s/130 and because of chest tightness he presented to the hospital for further evaluation. In the Emergency Room his blood pressure 177/120, pulse 76, respiration rate 18, oxygen saturation 97% on room air. Physical examination was unremarkable. Patient, how, had some epigastric abdominal discomfort.   LABORATORY, DIAGNOSTIC AND RADIOLOGICAL DATA: His EKG showed normal sinus rhythm with no significant ST or T wave abnormalities. Patient's lab data  showed mild elevation of BUN to 21, otherwise BMP was normal. Patient's liver enzymes showed AST elevation to 52 and ALT of 81, otherwise unremarkable liver enzymes. Cardiac enzymes, first set, as well as subsequent two more sets, were within normal limits. CBC was within normal limits and coagulation panel was unremarkable. Patient's chest x-ray done on 02/03/2011 PA and lateral showed no acute cardiopulmonary disease.  HOSPITAL COURSE:  1. Patient was admitted to telemetry. His cardiac enzymes were cycled and he was evaluated by rounding physician. After patient resumed his blood pressure medications his blood pressure reading wee much better. Patient's blood pressure was well controlled. Because of his the chest pains it was concerning that patient's chest pain could represent unstable angina. Patient agreed that he had stable angina symptoms, alsoo chest pain, worsening over the past one week. I personally discussed patient's case with Dr. Clayborn Bigness who was coming to see patient, however, patient decided not to stay in the hospital and despite our pleas of contrary he decided to go [place]. Dr. Clayborn Bigness missed patient just by few minutes. It was unclear, however, what best evaluation for this patient would be based on his symptoms. Patient could have been served having cardiac catheterization.   2. As patient was complaining of transient ischemic attack symptoms such as intermittent numbness as well as tingling sensation of the left side of his body work-up for risk factors for transient ischemic attack was also entertained. A carotid ultrasound as well as echocardiogram as well as lipid panel was ordered, however, as patient decided to leave [place] none of those were complete.  3. For tobacco abuse,  we discussed quite extensively patient's tobacco abuse issues and recommended to continue nicotine replacement therapy.  4. Patient was complaining of some anxiety as well as  depressive symptoms. It seems that his anxiety as well as depression were exacerbated tobacco cravings even though order was written for Valium as well as Zoloft and nicotine replacement therapy that was not sufficient time patient grew more impatient and uncomfortable and decided to leave [place] as mentioned above. It is recommended for patient to continue therapy with blood pressure medications and follow up with his primary care physician at [facility] to complete his testing including cardiac evaluation as well as possibly even evaluation for stroke prevention. Patient was noted to have elevated LFTs of unclear etiology. No work-up was entertained while he was in the hospital, however, it is recommended to get a hepatitis panel for this particular patient. He denied any alcohol use chronically.  5. On the day of day of discharge, patient's vitals are stable with temperature 97.3, pulse 70s to 80s, respirations 20 and blood pressure 122/76, saturation 97% on room air at rest.   TIME SPENT: 40 minutes.   ____________________________ Theodoro Grist, MD rv:cms D: 02/05/2012 21:22:02 ET T: 02/07/2012 12:11:54 ET JOB#: [id number]  cc: Theodoro Grist, MD, <Dictator> [facility] [place] MD ELECTRONICALLY SIGNED 02/16/2012 17:16

## 2015-04-15 NOTE — H&P (Signed)
PATIENT NAME:  Ian [name], Ian [name] MR#:  [id number] DATE OF BIRTH:  [birth year]  DATE OF ADMISSION:  06/06/2012  PRIMARY CARE PHYSICIAN: None.   ER REFERRING PHYSICIAN: Lurline Hare, MD   CHIEF COMPLAINT: Abdominal pain and hematemesis.   HISTORY OF PRESENT ILLNESS: The patient is a 54 year old male with significant past medical history of alcohol abuse, and severe depression, and history of gastroesophageal reflux disease who presents with complaints of abdominal pain and hematemesis x2. The patient reports he is currently being followed by [facility] for episodes of hematemesis, where he had an EGD done four weeks ago with studies which came back positive for esophageal cancer. The patient had repeat EGD done last week where he reports that the results came for infiltrating esophageal cancer. The patient is an extremely poor historian, and I had to talk to the patient's sister, who knows of most of his information, Ian [name], phone number [phone], who  reports the patient is being followed by [facility]. They saw the PA, Laureen Abrahams,  at phone number [phone], where last biopsy showed the tumor had infiltrated in multiple layers of esophagus, and the patient was planned to be referred to Thoracic Surgery for possible esophagectomy surgery. The patient reports after he was notified by his sister about his cancer diagnosis he became upset and started to drink. He had two 40-ounces of beer today, and he had two episodes of hematemesis. In the ED, the patient's Hemoccult was positive even though his hemoglobin was stable at previous level where it was 17.2. The patient denies any melena or bright red blood per rectum, any cough, shortness of breath, chest pain, leg swelling, petechiae. Also, the patient reports he feels severely depressed because of this recent diagnosis and he has some suicidal ideation as well, but he does have any concrete plan for that. After  reviewing the patient's medical record, it seems the patient had an EGD done by GI, Dr. Candace Cruise, in October of last year where the biopsy came back negative for any dysplasia or malignancy.   PAST MEDICAL/SURGICAL HISTORY:  1. History of alcohol abuse.  2. History of malignant hypertension.  3. Unstable angina.  4. Questionable transient ischemic attack.  5. Ongoing tobacco abuse.  6. Anxiety/depression.  7. History of medical noncompliance.  8. History of gastroesophageal reflux disease, status post EGD due to hematemesis in October 2012 which revealed LA grade 2 reflux esophagitis.  9. History of prostate cancer, status post seed implantation.  10. History of appendectomy.  11. Cholecystectomy.   DRUG ALLERGIES: . No known drug allergies.   HOME MEDICATIONS:  1. Mirtazapine 15 mg, 0.5 tablet b.i.d. 2. Mirtazapine 15 mg oral, 1 tablet at bedtime.  3. Telmisartan 80 mg daily.   SOCIAL HISTORY: The patient smokes a pack of cigarettes daily. He drinks 40 ounces of beer 1 to 2 a day. The patient reports he does not drink daily, but he has history of alcohol abuse. The patient also has history of cocaine abuse, snorting, the last time before two years.   FAMILY HISTORY: History of coronary artery disease.   REVIEW OF SYSTEMS: CONSTITUTIONAL: The patient denies any fever, fatigue, weakness. EYES: Denies blurry vision, double vision or pain. ENT: Denies tinnitus, ear pain, hearing loss. RESPIRATORY: Denies any cough, wheezing, dyspnea. CARDIOVASCULAR: Denies any chest pain, orthopnea, edema, arrhythmia. GASTROINTESTINAL: Denies any melena or bright red blood per rectum. Complains of nausea. Complains of hematemesis. Denies any  diarrhea. GU: Denies any dysuria, hematuria, or renal colic. HEMATOLOGY: Denies any easy bruising, bleeding, diathesis, or blood clots. NEUROLOGICAL:  Denies any numbness, weakness, dysarthria. PSYCHIATRIC: Has history of severe depression. History of suicidal ideation but no  concrete plan. History of alcohol and substance abuse.   PHYSICAL EXAMINATION:  VITAL SIGNS: Temperature 98.4, pulse 74, respiratory rate 18, blood pressure 113/70, saturating 94% on room air.   GENERAL: Well-developed, well-nourished male in no apparent distress, anxious, who is comfortable in bed.   HEENT: Pupils are equal and reactive to light. Pink conjunctivae. Anicteric sclerae. Moist oral mucosa. No pharyngeal erythema.   NECK: Supple. No thyromegaly. No bruits. No JVD.   LUNGS: The patient had good air entry bilaterally. No wheezing, rales or rhonchi.   CARDIOVASCULAR: S1, S2 heard. No rubs, murmur, or gallops.   ABDOMEN: Soft, nontender, nondistended. Bowel sounds present.   EXTREMITIES: No edema. No clubbing, no cyanosis.   NEUROLOGICAL: Cranial nerves are grossly intact. Motor is five out of five.   PSYCHIATRIC: He appears anxious, appropriate, awake and alert x3.   PERTINENT LABORATORY, DIAGNOSTIC AND RADIOLOGICAL DATA:  Glucose 97, BUN 16, creatinine 0.96, sodium 140, potassium 4, chloride 106, CO2 24, total protein 7.6, total bilirubin 0.3, alkaline phosphatase 83, AST 32, ALT 42.  Troponin less than 0.02.  White blood cells 14.5, hemoglobin 17.2, hematocrit 53, platelets 234.   ASSESSMENT AND PLAN:  1. Hematemesis: The patient has history of alcohol abuse as well a history of esophageal cancer as it was obtained from family. Recent EGD with biopsy and diagnosis of esophageal varices is unclear at this point, so bleed can be possibly due to his esophageal cancer versus esophageal varices. We will keep the patient n.p.o. We will check his hemoglobin and hematocrit every eight hours.  We will have him typed and screened and will start him on Protonix drip, as well as I will start him on octreotide drip. I will consult GI to follow up on the patient to see if it is necessary to have an EGD.  2. History of esophageal cancer:  The patient is being followed at [facility]. We will try to obtain the records of EGD biopsy results and visits from [facility]. We will consult GI as well.  3. History of alcohol abuse: The patient will be kept on CIWA  protocol and was counseled.  4. Tobacco abuse:  The patient was counseled. We will start on NicoDerm patch.  5. History of severe depression and suicidal ideation: The patient will be kept on suicide precaution protocol, and we will consult the Psychiatry Service.  6. History of hypertension: Currently his blood pressure is controlled, and secondary to upper GI bleed we will hold medication and it will be resumed if deemed to be necessary.  7. GI prophylaxis: The patient on Protonix drip.  8. Deep vein thrombosis prophylaxis secondary to #1: The patient will be kept on a sequential compression device.   CODE STATUS: FULL CODE.   I discussed case with the patient's sister over the phone, Ian [name], phone number [phone]. The  patient was seen at [facility]. PA is Laureen Abrahams, phone number [phone].    TIME SPENT ON PATIENT CARE: 55 minutes.   ____________________________ Albertine Patricia, MD dse:cbb D: 06/06/2012 01:46:09 ET T: 06/06/2012 10:04:53 ET JOB#: [id number]  cc: Albertine Patricia, MD, <Dictator> Yacqub Baston Graciela Husbands MD ELECTRONICALLY SIGNED 06/08/2012 0:17

## 2015-04-15 NOTE — Discharge Summary (Signed)
PATIENT NAME:  Ian [name], Ian [name] MR#:  [id number] DATE OF BIRTH:  1961/12/16  DATE OF ADMISSION:  06/06/2012 DATE OF DISCHARGE:  06/08/2012  ADMISSION DIAGNOSIS: Hematemesis.   DISCHARGE DIAGNOSES:  1. Hematemesis likely secondary to esophageal irritation from alcohol abuse.  2. Chronic obstructive pulmonary disease.  3. Alcohol abuse.  4. Tobacco use disorder. 5. Depression with suicidal ideation.  CONSULTANTS:  1. Gaylyn Cheers, MD. 2. Camie Patience, MD.  PROCEDURES: Colonoscopy was performed 06/08/2002 which showed one small polyp and internal hemorrhoids.   LABORATORY DATA: Discharge hemoglobin is 14.3. Sodium 141, potassium 4.6, chloride 109, bicarbonate 23, BUN 10, creatinine 0.70, and glucose 178. Magnesium 2.1.   HOSPITAL COURSE: This is a 54 year old male with a history of esophageal cancer who presented with hematemesis. For further details, please refer to the history and physical.  1. Hematemesis - the patient had no episodes of hematemesis here. Gastroenterology was consulted. It was suspected that he had some irritation of his esophagus from alcohol. He was on a PPI, octreotide drip for 48 hours. He will be discharged with a PPI. He underwent a colonoscopy which showed one small polyuria and internal hemorrhoids. He will be referred back to [facility] for his esophageal cancer.  2. Chronic obstructive pulmonary disease exacerbation - apparently the patient had some wheezing on admission; however, he had no more wheezing after this. Steroids were discontinued.  3. Alcohol abuse - on CIWA, uneventful detox.  4. Tobacco use disorder - the patient was counseled and placed on a nicotine patch.  5. Depression with suicidal ideation - this was momentarily due to the acute issues of #1. Psychiatry was consulted. He was not suicidal. The sitter was discontinued. His Remeron was increased and Klonopin was increased per Dr. Camie Patience.   DISCHARGE MEDICATIONS:  1. Remeron 30 mg at bedtime.   2. Clonazepam 1 mg p.o. three times daily. 3. Telmisartan 80 mg daily.  4. Nicotine patch 21 mg per 24 hours.  5. Nexium 40 mg daily.   DISCHARGE DIET: Eat light for the first meal then regular.   DISCHARGE ACTIVITY: As tolerated.   DISCHARGE FOLLOWUP: The patient will follow-up with [facility] for his esophageal cancer.   TIME SPENT: 35 minutes.  ____________________________ Donell Beers. Benjie Karvonen, MD spm:slb D: 06/08/2012 12:08:45 ET T: 06/08/2012 12:22:25 ET JOB#: [id number]  cc: Dametra Whetsel P. Benjie Karvonen, MD, <Dictator> Donell Beers Socorro Kanitz MD ELECTRONICALLY SIGNED 06/08/2012 13:53

## 2015-04-15 NOTE — H&P (Signed)
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 Patient is divorced for the second time for ten years after being married for one year, living together many years and currently lives in a trailer by himself. Patient comes for readmission to [facility] after being discharged on 05/13/2012 with a chief complaint "I was stupid to tell the police what I told them".   HISTORY OF PRESENT ILLNESS: Patient reports that he has been feeling depressed ever since [facility] told him that he may have esophageal cancer and they need to get surgery done. In fact he reports that he has an appointment coming up on 06/15/2012 and they will decide on his surgery on the esophagus. Thinking about this he got very depressed and he got drunk by drinking 12 to 15 beers and got scared and called police and told them he did not want to live anymore and he wishes he did not do that. Patient reports that he has been feeling depressed ever since [facility] started working him up for esophageal cancer.   PAST PSYCHIATRIC HISTORY: This is his third inpatient hospitalization in psychiatry. First inpatient hospitalization in psychiatry in 24s at age 61 years when he cut his wrist over his relationship and was admitted to [facility]. Second inpatient hospitalization in psychiatry was on 05/11/2012 to 05/13/2012 when he got depressed about that news about the EGD biopsy showing esophageal cancer. He was stabilized and he was recommended follow-up appointment with Dr. Jacqualine Code with [facility] on 08/24/2012. Patient reports that he is not on any medications at this time and could not keep his appointment because he came back for readmission prior to  the appointment in September 2013. Tried to kill himself only once when he slashed his wrist over his relationship when he was 54 years old.  FAMILY HISTORY OF MENTAL ILLNESS: No known mental illness.   FAMILY HISTORY: Raised by parents. Father worked in the post office. Father died of leukemia in his 38s. Mother worked for [employer] and [place]. Mother died at age 3 years with liver cancer. Has one brother, one sister. Close to sister and not to brother.   PERSONAL HISTORY: Born in [place]. Dropped out in 11th grade because he got a job. No GED.   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 This job lasted from age 64 years to 18 years. Quit that job because they ran out of business. 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  MILITARY HISTORY: None.   ALCOHOL AND DRUGS: First drink of alcohol was 15 or 16 years. Denies ever having problems with alcohol. Has four DWIs. Did not get his license. He lost his driver's license. Never arrested for public drunkenness. Currently he reports he drinks binges and he was drinking 12 to 15 beers last night prior to admission because he was depressed about his diagnosis of cancer and his appointment coming up for surgery very soon. Denies street or prescription drug abuse. Does admit smoking nicotine cigarettes at rate of pack a day for many years.   PAST MEDICAL HISTORY: Has high blood pressure. No known history of diabetes mellitus. Status post  cholecystectomy. No history of motor vehicle accident, never been unconscious. Admits that he passed out because of high blood pressure on two occasions. He has gastroesophageal reflux disease and history of prostate cancer, history of upper GI cancer.   ALLERGIES: No known drug allergies.   MEDICATIONS: Was on the following medications according to the chart: 1. Ventolin HFA 90 mcg daily.  2. Nexium 40 mg daily. 3. Micardis 80 mg b.i.d.   4. Clonazepam 0.5 to 1 mg p.o. b.i.d. p.r.n.   PRIMARY CARE PHYSICIAN: Not being followed by any physician at this time. He is not able to afford to do so.   PHYSICAL EXAMINATION: VITAL SIGNS: Temperature 98.4, pulse 78 per minute regular, respirations 18 per minute regular, blood pressure 134/90 mmHg.   HEENT: Head is normocephalic, atraumatic. Pupils are equal, round, and reactive to light and accommodation. Fundi bilaterally benign. Extraocular movements visualized. Tympanic membrane visualized, no exudates.  NECK: Supple without any organomegaly, lymphadenopathy, thyromegaly.   CHEST: Normal expansion. Normal breath sounds heard.  HEART: Normal S1, S2 without any murmurs or gallops.   ABDOMEN: Soft. No organomegaly. Bowel sounds heard.   RECTAL: Deferred.  NEUROLOGICAL: Gait is normal. Romberg is negative. Cranial nerves II through XII grossly intact. DTRs 2+ and normal. Plantars normal response.   MENTAL STATUS EXAMINATION: Patient is dressed in hospital pajamas. Has good eye contact. Alert and oriented to place, person and time. Fully aware of situation that brought him for admission to [facility]. Affect is appropriate with his mood which is low and down and depressed. He is depressed about his diagnosis of cancer and is anxious about the same. Feels hopeless and helpless about the same. Denies feeling worthless or useless. Denies any ideas or plans to hurt himself or others. No evidence of psychosis. Denies auditory or visual hallucinations. Denies hearing voices, seeing things. Thought processes are logical and goal directed. Cognition is intact. General knowledge and information is fair for his level of education. Does admit to sleep and appetite disturbance because  about his cancer. Insight and judgement guarded.  IMPRESSION: AXIS I:  1. Major depressive disorder, recurrent, severe. 2. Generalized anxiety disorder.  3. History of cocaine abuse according to the chart.  4. Alcohol  dependence.  5. Nicotine dependence.   AXIS II: Deferred.   AXIS III:  1. Upper GI showed cancer.  S/P Cholecystectomy 3. Hypertension, labile.  4. Gastroesophageal reflux disease.  5. History of prostate cancer.   AXIS IV: Severe-Recent diagnosis of cancer and anxiety and depression related to the same.   AXIS V: Global Assessment of Functioning 25.   PLAN: Patient admitted to [facility] for close observation, evaluation and help. He will be started back on all of the medications that he was started on during his previous admission. During the stay in the hospital he will be given milieu therapy and supportive counseling. He will take part in group and individual therapy as soon as he is ready to do so where coping skills will be discussed. At the time of discharge he will not be depressed and appropriate follow-up appointments will be made and he will be encouraged to keep them.   ____________________________ Wallace Cullens. Franchot Mimes, MD skc:cms D: 06/13/2012 16:43:25 ET T: 06/14/2012 06:02:54 ET JOB#: [id number]  cc: Arlyn Leak K. Franchot Mimes, MD, <Dictator> Dewain Penning MD ELECTRONICALLY SIGNED 06/19/2012 17:21

## 2015-04-15 NOTE — Consult Note (Signed)
PATIENT NAME:  Ian [name], MARVIN MR#:  [id number] DATE OF BIRTH:  1961-11-11  DATE OF CONSULTATION:  06/06/2012  REFERRING PHYSICIAN:   CONSULTING PHYSICIAN:  Manya Silvas, MD  HISTORY OF PRESENT ILLNESS: The patient is a 54 year old white male who has significant history of gastroesophageal reflux disease, depression, alcohol abuse, cocaine abuse, and chronic esophagitis. He had an endoscopy four weeks ago that showed biopsy positive for esophageal cancer. He had what sounds like an ablation procedure and he had a repeat endoscopy which sounds like an endoscopic ultrasound that showed that the malignancy was infiltrated through layers in the esophagus and it has been planned to refer him to Thoracic Surgery. Because of his instability, these arrangements supposedly have been made through his sister.   Because of the stress of the diagnosis of cancer, he has begun drinking again. He drank a couple of 40 ounce beers, had a couple of episodes of hematemesis, came to the ER, found to have a hemoglobin of 17. He reported being very depressed and he was admitted for hematemesis and depression and I was asked to see him in consultation.   PAST HISTORY:  1. Gallbladder removal.  2. Appendectomy.  3. Prostate cancer, previous seed implantation.  4. Chronic anxiety.  5. Medical noncompliance.  6. Significant esophagitis on endoscopy October 2012.  7. Alcohol abuse.  8. History of hypertension.  9. Previous admission for chest pains, possible angina. 10. Possible transient ischemic attack six months ago.   REVIEW OF SYSTEMS: He still complains of epigastric and substernal chest pain, worse when he eats or drinks. He did have vomiting twice but he has not vomited since he has been in his room for about 13 hours now. Sometimes when he eats food hangs up in his lower esophagus and he has to drink water to push it down. He does not have any top teeth nor any dentures so he has to eat very soft food  usually. Also, the patient complains of asthma, wheezing, chronic coughing.    ALLERGIES: No known drug allergies.   HOME MEDICATIONS:  1. Telmisartan 80 mg a day.  2. Remeron 15 mg half a tablet b.i.d., 1 tablet at bedtime.   HABITS: Smokes a pack, pack and a half a day. Drinks beer, a couple of 40 ounces a day or more. Supposedly last time he had cocaine was two years ago but he was tested positive last year in the ER.   FAMILY HISTORY: Positive for coronary artery disease.   PHYSICAL EXAMINATION:    GENERAL: White male, looks somewhat disheveled, no acute distress. Answers questions appropriately. No signs of tremor.  VITAL SIGNS: Temperature 97.6, pulse 72, respirations 18, blood pressure 120/76.   HEENT: Sclerae nonicteric. Conjunctivae negative.   HEAD: Atraumatic.   CHEST: Slight inspiratory and expiratory wheezes. Globally decreased air flow.   HEART: No murmurs or gallops I can hear.   ABDOMEN: There is some epigastric tenderness to palpation. Bowel sounds are present. Abdomen nondistended. No hepatosplenomegaly. No masses. No bruits.   EXTREMITIES: No edema. No cyanosis.   NEUROLOGIC: The patient is awake, alert, and oriented.   LABORATORY DATA: Glucose 97, BUN 16, creatinine 0.96, sodium 140, potassium 4, chloride 106, CO2 24, total bilirubin 0.3, alkaline phosphatase 83, AST 32, ALT 42. Troponin less than 0.02. White count 14.5, hemoglobin 17, platelet count 234. Liver panel is normal. Repeat liver panel showed slight elevation of SGOT. Repeat CBC 9 hours after the first one showed white count  11.3, hemoglobin 16.2, hematocrit 49.2, platelet count 193. He has A+ blood with negative antibody screen. Pro-time 13.1. Urinalysis essentially unremarkable. Portable chest shows no acute cardiopulmonary disease.   ASSESSMENT AND PLAN: Very likely he has hematemesis from vomiting secondary to drinking beer which probably aggravated the inflamed tissue. Given the absence of any  further bleeding, the fact that he's had two endoscopies in the last four weeks, the fact that his hemoglobin is stable, no need for endoscopy at this time. He is on inhalers and steroids for his breathing. I would continue these. Would work with his sister to try to be sure he gets back for his thoracic surgery appointment at [facility]. Since the endoscopic ultrasound showed involvement of the deeper layers of the esophagus, likely surgery will be indicated.   Will follow with you but no endoscopy planned at this time.   ____________________________ Manya Silvas, MD rte:drc D: 06/06/2012 12:31:32 ET T: 06/06/2012 12:50:34 ET JOB#: [id number]  cc: Manya Silvas, MD, <Dictator>  Manya Silvas MD ELECTRONICALLY SIGNED 06/15/2012 16:08

## 2015-04-15 NOTE — H&P (Signed)
PATIENT NAME:  Ian [name], Ian [name] MR#:  [id number] DATE OF BIRTH:  Apr 05, 1961  DATE OF ADMISSION:  05/11/2012   ADDENDUM  PHYSICAL EXAMINATION:  HEENT: Normocephalic, atraumatic. Pupils equally round and reactive to light and accommodation. Oropharynx clear without erythema.   EXTREMITIES: No cyanosis, clubbing, or edema.   SKIN:  Normal turgor. No rashes.   RESPIRATORY: Clear to auscultation. No wheezing, rhonchi, or rales.   CARDIOVASCULAR: Regular rate and rhythm. No murmurs, rubs, or gallops.   ABDOMEN: Nondistended.  Bowel sounds positive. Soft and nontender. No masses.   GENITOURINARY: Deferred.   NEUROLOGIC: Cranial nerves II through XII intact. General sensory intact throughout to light touch. Motor is 5/5 strength throughout, except the left lower extremity is 20% weaker than the right throughout flexion and extension of all joints. Deep tendon reflexes normal. Strength and symmetry throughout. No Babinski. Coordination intact by finger-to-nose bilaterally.   ADDENDUM TO ASSESSMENT:   AXIS III: The patient does report that he has a lower back injury and that he has shooting pains from the lower back down through his left leg. He has reported this to his general medical physicians. They have currently been focusing on the problem of his upper gastrointestinal tract.      For now this problem is assessed as lower lumbar disease with left radiculopathy. The patient will need follow-up and evaluation as an outpatient.  ____________________________ Drue Stager. Audriella Blakeley, MD jsw:slb D: 05/12/2012 20:14:30 ET T: 05/13/2012 07:11:05 ET JOB#: [id number]  cc: Drue Stager. Aidric Endicott, MD, <Dictator> Billie Ruddy MD ELECTRONICALLY SIGNED 05/13/2012 17:03

## 2015-04-15 NOTE — H&P (Signed)
PATIENT NAME:  Ian [name], Ian [name] MR#:  [id number] DATE OF BIRTH:  07-11-1961  DATE OF ADMISSION:  04/19/2012  PRIMARY DOCTOR: None, goes to [facility].   ER PHYSICIAN: Dr. Robet Leu   CHIEF COMPLAINT: Syncope.   HISTORY OF PRESENT ILLNESS: The patient is a 53 year old male patient who was here in October of 2012 and also recently was discharged on March 1st. The patient has a history of hypertension, gastroesophageal reflux disease, tobacco abuse, depression, and anxiety who came in because the patient had two episodes of syncope at home. The patient says that the first episode was around 10 o'clock. The patient was getting out of a chair and was standing up and then felt dizzy and passed out. He says probably he lost consciousness for about five minutes. The second episode happened around 12 a.m. He was standing on the deck and neighbor came to say hi to him and then he was found on the floor. He hit his head on the railing. The patient says that both times he felt dizzy. He says that he had vomited two times this morning but no abdominal pain. No nausea. No diarrhea. No cough. No fever. He has been having vague pain in the left leg and in the lower back for the last two to three months. The patient has been admitted before in February for syncope, likely vasovagal syncope, and he also was admitted for chest pain at that time.   PAST MEDICAL HISTORY: 1. History of possible Barrett's. 2. Prostate cancer with seed implantation.  3. Malignant hypertension.  4. Tobacco abuse.  5. Gastroesophageal reflux disease. 6. Admission February 20th to March 1st for chest pain. He also was admitted in October for chest pain and abdominal pain   ALLERGIES: No known allergies.   SOCIAL HISTORY: Still smokes. Lives alone. Smokes about 1 pack per day. Not interested in quitting. He is not drinking that much; last drink was around three to four days ago. No drugs.   MEDICATIONS: 1. Klonopin 0.5 mg p.o.  b.i.d. as needed.  2. Norvasc 5 mg daily. 3. Micardis 40 mg daily.  4. Albuterol as needed 2 puffs. 5. Formoterol inhaler b.i.d.   PAST SURGICAL HISTORY:  1. Cholecystectomy.  2. Appendectomy.  3. Prostate cancer with seed implantation.  4. He also had an EGD in October of 2012 which showed reflux esophagitis. The patient had chronic duodenitis as well.   The patient also had a stress test done in February of 2013 which showed normal stress test with EF of 73%.   The patient also had a carotid ultrasound and echocardiogram and everything done for syncope work-up. Everything was within normal limits.   REVIEW OF SYSTEMS: CONSTITUTIONAL: Feels weak. No fever. EYES: No blurred vision. ENT: No tinnitus. No epistaxis. No difficulty swallowing. RESPIRATORY: Has chronic cough and wheezing. CARDIOVASCULAR: No chest pain. Has no orthopnea. No PND. GI: Had nausea today and some vomiting. No abdominal pain. Has questionable Barrett's. GU: No dysuria. ENDOCRINE: No polyuria or nocturia. INTEGUMENTARY: No skin rashes. MUSCULOSKELETAL: Complains of low back pain radiating to the left leg associated with numbness in the leg and tingling mainly more when he walks and does exertion. Otherwise, it gets better with rest. NEUROLOGIC: No dysarthria. No ataxia. PSYCH: The patient has anxiety, uses Klonopin.   PHYSICAL EXAMINATION:   VITAL SIGNS: Temperature 97.1, pulse 114, respirations 18, initial blood pressure 182/114, repeat blood pressure 175/96, pulse 104.  GENERAL: The patient is a 54 year old well nourished  male not in distress. Hygiene is poor.   HEENT: Head atraumatic, normocephalic. Pupils equally reacting to light. Extraocular movements are intact.   ENT: No tympanic membrane congestion. Has no turbinate hypertrophy. No oropharyngeal erythema.   NECK: Supple. No JVD. No thyroid enlargement.   CARDIOVASCULAR: S1, S2 regular. No murmurs.   LUNGS: The patient has very faint wheezing bilaterally  especially in the lower lungs, otherwise, not using accessory muscles for respiration. No rales.   ABDOMEN: Soft, nontender. Bowel sounds present.   EXTREMITIES: No extremity edema. No cyanosis. No clubbing. The patient is unable to do straight leg raising on the left side, complains of back pain. Sensations are intact bilaterally. Has no tenderness in the back and no swellings are observed in the lumbar spine area.   SKIN: Warm and moist.   NEUROLOGIC: Cranial nerves II through XII are intact. Sensations are intact. No dysarthria or aphasia. Neurological exam is within normal limits.   PSYCH: Oriented to time, place, person.   LABORATORY, DIAGNOSTIC, AND RADIOLOGICAL DATA: CT of the chest did not show any pulmonary emboli. Bilateral hilar adenopathy. No pulmonary emboli. Mild base atelectasis. Small bilateral hernia.   CT head is done because of syncope which did not show any acute changes. Has thickening of sphenoid and ethmoidal sinuses consistent with sinusitis.   D-dimer 0.58. Ethanol less than 3. CK total 31. CPK-MB less than 0.5.   Chest x-ray showed no acute changes.   Troponin less than 0.02. White count 8.1, hemoglobin 17.5, hematocrit 52.5, platelets 201. Electrolytes sodium 138, potassium 4.2, chloride 106, bicarb 28, BUN 14, creatinine 0.92, glucose 87. Liver functions within normal limits.   The patient's EKG showed normal sinus at 73 beats per minute. Has no ST-T changes.   ASSESSMENT AND PLAN:  1. This is a 54 year old male with syncope. The patient may have had vasovagal syncope. We will check orthostatic vitals. CT head is negative. His carotid ultrasound and echocardiogram which were done recently were normal so I'm not going to repeat it because they were just done in February. The patient will have any EEG to rule out possible seizure and placement of seizure precautions. Use Ativan if needed for seizures.  2. The patient has hypertension, uncontrolled. His blood  pressure medications are restarted which are Micardis and Norvasc. Follow the blood pressure.  3. Anxiety, on Klonopin. Restarted.  4. Chronic obstructive pulmonary disease with active tobacco abuse. Counseled about three minutes. He does not want to quit. We will give him Ventolin and also Advair. He is not hypoxic. Use them as needed for symptoms.  5. Lower back pain with radiation to the left leg associated with some numbness, possible radiculopathy. Will start him on Neurontin and get lumbosacral spine x-ray.   CONDITION: Stable. He will be on Observation status.   TIME SPENT: About 55 minutes.  ____________________________ Epifanio Lesches, MD sk:drc D: 04/19/2012 20:13:35 ET T: 04/20/2012 06:05:30 ET JOB#: [id number]  cc: Epifanio Lesches, MD, <Dictator> [facility] Epifanio Lesches MD ELECTRONICALLY SIGNED 04/21/2012 13:59

## 2015-04-15 NOTE — H&P (Signed)
PATIENT NAME:  Ian [name], Ian [name] MR#:  [id number] DATE OF BIRTH:  [birth year]  DATE OF ADMISSION:  02/04/2012  PRIMARY CARE PHYSICIAN: [facility].   CHIEF COMPLAINT: High blood pressure and chest pressure.   HISTORY OF PRESENT ILLNESS: This is a 54 year old male who has a history of hypertension. He was prescribed some medicines the other day at the [facility]. He just got them filled today. He was at the walk-in clinic last night complaining of some chest tightness. He was found to have greatly elevated blood pressure and was advised to come to the ER, but refused to go. His mother urged him to come today. He was found to have a blood pressure greater than 809 systolically and complained of some chest tightness. Currently his blood pressure is 177/120 and he said he just had some tightness, but no chest pain. He had a stress test last year, which was normal. He does continue to smoke.   PAST MEDICAL HISTORY:  1. Hypertension.  2. Gastroesophageal reflux disease. 3. Prostate cancer, status post seed implants.  4. Tobacco abuse.   PAST SURGICAL HISTORY:  1. Appendectomy. 2. Cholecystectomy.   ALLERGIES: No known drug allergies.   CURRENT MEDICATIONS: (which he just got filled today but has not taken) 1. Norvasc 5 mg daily.  2. Micardis 40 mg daily.  3. Zantac 150 mg twice a day. 4. Albuterol metered-dose inhaler every 4 hours p.r.n.   SOCIAL HISTORY: He smokes about a pack of cigarettes a day. He drinks about two 40 ounce beers a day.   FAMILY HISTORY: Significant for coronary artery disease.   REVIEW OF SYSTEMS: CONSTITUTIONAL: No fever or chills. EYES: No blurred vision. ENT: No hearing loss. CARDIOVASCULAR: Some chest pressure. PULMONARY: No shortness of breath. GI: No nausea, vomiting, or diarrhea. GU: No dysuria. ENDOCRINE: No heat or cold intolerance. INTEGUMENT: No rash. MUSCULOSKELETAL: Occasional joint pain. NEUROLOGIC: No numbness or  weakness.   PHYSICAL EXAMINATION:   VITAL SIGNS: Blood pressure 177/120, pulse 76, respiration 18, Oxygen saturation 97% on room air.   GENERAL: This is a well-nourished white male in no acute distress.   HEENT: Pupils are equal, round, and reactive to light. Sclerae is mildly injected. Oral mucosa is moist. Oropharynx is clear. Nasopharynx is clear.   NECK: Supple. No JVD, lymphadenopathy, or thyromegaly.   CARDIOVASCULAR: Regular rate and rhythm. There is a S4. No murmurs.   LUNGS: There is some very mild expiratory wheezing. No dullness to percussion. He is not using accessory muscles.   ABDOMEN: Soft, nontender, and nondistended. Bowel sounds are positive. No hepatosplenomegaly. No masses.   EXTREMITIES: There is no edema. No joint deformities. Full range of motion in the upper and lower extremities   NEUROLOGIC: Cranial nerves II through XII are intact. He is alert and oriented x4.   SKIN: Moist with no rash.   LABS/STUDIES: EKG shows normal sinus rhythm.   Troponin is less than 0.02.   ASSESSMENT AND PLAN:  1. Malignant hypertension: We will go ahead and give him some IV labetalol and put him on a  nitro patch to try to bring his pressure down somewhat this evening. I am going to start him on half dose of his oral medications, that were prescribed, tonight and then put him on full dose in the morning.  I have ordered some p.r.n. IV labetalol to augment this during the night, and we will check frequent blood pressures.  2. Chest tightness:  Suspect this is from his hypertension. He had a normal stress test last year, no other type symptoms. If he is still having this type symptoms when we get his blood pressure better controlled, then we may proceed with further cardiac work-up. I will also go ahead and check some cardiac enzymes.  3. Gastroesophageal reflux disease: We will put him on his H2-blocker.  4. Tobacco abuse: I did counsel about smoking cessation. We are going to give  him a nicotine patch. He is wheezing. I do suspect he probably has some underlying chronic obstructive pulmonary disease that has been undiagnosed. I am going to go ahead and start him on some albuterol and ipratropium meter dose inhalers while here in the hospital.   TIME SPENT ON ADMISSION: 45 minutes. ____________________________ Baxter Hire, MD jdj:[id number] D: 02/04/2012 22:32:12 ET T: 02/05/2012 07:54:16 ET JOB#: [id number]  cc: Baxter Hire, MD, <Dictator> [facility] Baxter Hire MD ELECTRONICALLY SIGNED 02/14/2012 17:32
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
## 2015-04-15 NOTE — Consult Note (Signed)
No hematemesis, VSS and afebrile, hgb stable.  Can advance to full liquids and mashed potatoes.  could be discharged from GI standpoint.  Electronic Signatures: Manya Silvas (MD)  (Signed on 17-Jun-13 12:24)  Authored  Last Updated: 17-Jun-13 12:24 by Manya Silvas (MD)

## 2015-04-22 NOTE — H&P (Signed)
PATIENT NAME:  Ian [name], Ian [name] MR#:  [id number] DATE OF BIRTH:  1961-11-13  DATE OF ADMISSION:  02/24/2015  IDENTIFYING DATA: The patient is a 54 year old male who self-referred to the emergency room secondary to depression and alcohol use.   CHIEF COMPLAINT: "Depression" and drinking.   HISTORY OF PRESENT ILLNESS: The patient reports that for the past 2 weeks he started back drinking alcohol. He is drinking 6 to 7 forty ounce beers a day. He states that he had been sober from alcohol and then started experiencing stressors and depression and then resumed using alcohol 2 weeks ago. He also reports depressive symptoms of poor sleep, anhedonia, decreased energy and depressed mood. He denies that he has developed suicidal ideation, but states that if he felt like he did not get help, he might develop that. He reports he has a diagnosis of bipolar; however, when you question him about the symptoms of mania, he states that he might go 4 to 5 days without sleeping, but his activities are largely confined to work or looking for work.   In regards to indiscretions, he states that he might spend money, but then he implied he would spend money on various projects like trying to repair his driveway or use energy trying to go from place to place to look for work. 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 He states that currently there is some conflict there as one of the managers is supportive of him and then there is another Freight forwarder who is not supportive of him. He states that he is dealing with 2 issues at that employment; one is dealing with the lack of support by one of the managers. He states also there has been general word and rumors that the golf course may close. He states that both of these issues are causing him stress.   The patient has been getting treatment  from [facility] and reports that he was on a combination of Celexa, Seroquel and then states that he was on lorazepam and perhaps Klonopin. He states his lorazepam dose might have been 4 times a day as needed and his Klonopin dose he would have like 0.5 mg at bedtime with his bedtime Seroquel. He felt like these medications were generally effective for him.   PAST PSYCHIATRIC HISTORY: As above, getting outpatient treatment at [facility]. He states that in regards to past suicide attempts, he states that 2 years ago he overdosed on Tylenol. He states there was an episode where he cut himself when he was 54 years old. In regards to substance use, he indicates that he has had DWIs in the past. He states that he attended [facility] many years ago. There is some conflicting information as per the ED consult. He indicated he is not been followed by any outpatient care at this time; however, he provided a history of being seen at [facility] by a Dr. Carman Ching. The patient denied any use of abuse of prescription drugs or street drugs; however, his toxicology screen is positive for cocaine.   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 He states he had a stroke 5 to 6 years ago. He states he has what he describes as peripheral arterial disease and states he has a stent in his left leg. Hypertension, hepatitis C, prostate cancer, GERD, TIA.Marland Kitchen He states past  surgeries include cholecystectomy, appendectomy and right hand reconstruction when he put his hand through a glass door in 1964.   FAMILY HISTORY OF PSYCHIATRIC ILLNESS: . He states he has a paternal uncle that was treated for depression. He states his sister takes Xanax and relates that it might be due to her having a mastectomy secondary to cancer.   SOCIAL HISTORY: He has been divorced twice and lives by himself in a trailer.   CURRENT MEDICATIONS: The patient reports that he has been taking Celexa 40 mg daily, Seroquel 400 mg at bedtime, omeprazole 40 mg  delayed release capsule daily, ProAir HFA CFC free 90 mcg inhaler aerosol 2 puffs every 4 hours as needed, lisinopril 10 mg daily.   ALLERGIES: VICODIN AND DESCRIBES THIS MORE AS JUST ITCHING.   REVIEW OF SYSTEMS: Positive for right leg pain for the past 2 to 3 months and shortness of breath periodically. He also states that he has night sweats and chills for the past 1 to 2 months. He states he has a cough secondary to his smoking habit. He denies diarrhea, denies constipation, denies dysuria. Other 10 point review of systems is negative.   MENTAL STATUS EXAMINATION: The patient is a middle-aged male, appearing his stated age of 33. He was alert and oriented x 4. He had fair grooming. He maintained good eye contact. There were no abnormal movements. His thought process was linear and goal directed. Thought content: No suicidal ideation, no homicide ideation, no auditory hallucinations, no visual hallucinations, no delusional thinking. His mood was depressed. His affect was constricted. His insight and judgment appear to be fair. His remote memory is intact as evident by his ability to provide dates and years for events that occurred several years ago. His immediate memory is intact as evident by his ability to describe events in the emergency room and events leading up to this admission and his current dose of his medications.    PHYSICAL EXAMINATION:  VITAL SIGNS: Temperature 98.2, pulse of 79, respiratory rate 20, blood pressure 154/108 sitting and 168/119 standing. Most recent pulse oximetry was 97% on room air.  GENERAL: His gait was within normal limits.  MUSCULOSKELETAL: Exam was grossly normal.   LABORATORY DATA: His toxicology screen was positive for tricyclics and positive for cocaine. His urinalysis was within normal limits. His CBC was significant for elevated white count at 17.7. His hemoglobin was normal  and his hematocrit was normal at 50.5. His comprehensive metabolic panel was  significant for an elevated SGPT of 71 and SGOT elevated at 52. His alcohol level was 189 on 02/23/2015.   DIAGNOSES:  1. Alcohol use disorder, severe.  2. Cocaine use disorder, mild.  3. Unspecified depressive disorder.   ASSESSMENT: A 54 year old, divorced male who is reporting depression over the past couple of weeks. Also, he has returned to alcohol use at this time. It appears these things are occurring in the context of some social stressors about his employment. Thus, at this time it is not clear how much of these depressive symptoms are by themselves or in the context of the social stressors above.    PLAN: We will repeat his CBC given and that he is reporting a cough, which is long-standing and has elevated white count. He will be on the CIWA protocol to observe for any alcohol withdrawal. We will continue with Celexa at 40 mg daily. We will write him for his outpatient dose of Seroquel at 400 mg at bedtime. We are going  to try to use p.r.n. Vistaril and p.r.n. Seroquel to address his anxiety. We will withhold starting any benzodiazepines as needed for anxiety because of ongoing issues with alcohol and some ongoing issues with the use of cocaine. We will also send the patient for a chest x-ray to rule out any pulmonary causes for his elevated white count and also positive review of systems for night sweats and chills.     ____________________________ Marvis Repress Jimmye Norman, MD alw:TT D: 02/25/2015 12:03:14 ET T: 02/25/2015 13:37:03 ET JOB#: [id number]  cc: Milus Banister L. Jimmye Norman, MD, <Dictator> Marjie Skiff MD ELECTRONICALLY SIGNED 02/25/2015 15:14

## 2015-04-22 NOTE — Consult Note (Signed)
PATIENT NAME:  Ian [name], Ian [name] MR#:  [id number] DATE OF BIRTH:  09-11-1961  DATE OF CONSULTATION:  02/24/2015  REFERRING PHYSICIAN:   CONSULTING PHYSICIAN:  Winni Ehrhard K. Sophiya Morello, MD  AGE: 54 years.  SEX: Male.  RACE: White.  SUBJECTIVE: The patient was seen in consultation in [facility] Emergency Room at [facility]. The patient is a 54 year old white male who is not employed and is on disability check for multiple physical and mental problems. The patient is divorced twice and lives by himself in a trailer. The patient reports that he has been sober for over a year and he did it by himself and then he was depressed and started drinking alcohol again, has been drinking at the rate of 6 or 7 of 40 ounce beers and decided that he needed help and came for help.  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 Then he came to know that the gold course is being closed because of high maintenance and they are not able to afford to keep it open any more. He got depressed and he started drinking again.  PAST PSYCHIATRIC HISTORY: History of inpatient hospital psychiatry on a few occasions. The patient reports that he was an alcoholic and was drinking alcohol on a heavy basis, in fact he got 5 DWIs and lost his driver's license and he could get it back but it will cost 2000 or 3000 dollars. He walks around, gets rides from friends. He was arrested for public drunkenness twice. He was sober for over a year and he did it by himself. Denies any street or prescription or drug abuse. Does admit smoking nicotine cigarettes at a rate of a few per day. The patient had been to [facility] many years ago and he did not like it and he felt that they did not help him. Not being followed on any outpatient basis by  anybody at this time. No history of suicide attempts.  MENTAL STATUS: The patient appears disheveled in appearance, alert and oriented, calm, pleasant and cooperative. No agitation. Denies feeling depressed but he said that he was suicidal when he was drunk and currently he wants to get help and does not feel that way. No psychosis, does not appear to be responding to internal stimuli. He is eager to get help and he feels that he is not safe to go home. Insight and judgment guarded. Impulse control poor.   IMPRESSION: Alcohol dependence, chronic, continuous with intoxication; adjustment disorder with depressed mood secondary to his current job as a Psychologist, occupational at golf course coming to an end.  RECOMMENDATION: Inpatient hospital psychiatry for a close observation, evaluation and help. He is on CIWA protocol. We will start him on Celexa 40 mg p.o. daily to help him with depression and trazodone 100 mg p.o. at bedtime to help him rest at night.   ____________________________ Wallace Cullens. Franchot Mimes, MD skc:TM D: 02/24/2015 15:03:56 ET T: 02/24/2015 16:10:32 ET JOB#: [id number]  cc: Arlyn Leak K. Franchot Mimes, MD, <Dictator> Dewain Penning MD ELECTRONICALLY SIGNED 02/25/2015 16:15

## 2015-05-02 ENCOUNTER — Inpatient Hospital Stay
Admission: EM | Admit: 2015-05-02 | Discharge: 2015-05-07 | DRG: 641 | Disposition: A | Discharge status: Home / Self Care | Payer: Medicaid Other | Attending: Internal Medicine | Admitting: Internal Medicine

## 2015-05-02 DIAGNOSIS — Z9861 Coronary angioplasty status: Secondary | ICD-10-CM

## 2015-05-02 DIAGNOSIS — Z8546 Personal history of malignant neoplasm of prostate: Secondary | ICD-10-CM

## 2015-05-02 DIAGNOSIS — F10920 Alcohol use, unspecified with intoxication, uncomplicated: Secondary | ICD-10-CM

## 2015-05-02 DIAGNOSIS — F17210 Nicotine dependence, cigarettes, uncomplicated: Secondary | ICD-10-CM | POA: Diagnosis present

## 2015-05-02 DIAGNOSIS — R531 Weakness: Secondary | ICD-10-CM

## 2015-05-02 DIAGNOSIS — F419 Anxiety disorder, unspecified: Secondary | ICD-10-CM | POA: Diagnosis present

## 2015-05-02 DIAGNOSIS — R079 Chest pain, unspecified: Secondary | ICD-10-CM

## 2015-05-02 DIAGNOSIS — R29898 Other symptoms and signs involving the musculoskeletal system: Secondary | ICD-10-CM

## 2015-05-02 DIAGNOSIS — M542 Cervicalgia: Secondary | ICD-10-CM | POA: Diagnosis present

## 2015-05-02 DIAGNOSIS — F809 Developmental disorder of speech and language, unspecified: Secondary | ICD-10-CM

## 2015-05-02 DIAGNOSIS — F10129 Alcohol abuse with intoxication, unspecified: Secondary | ICD-10-CM | POA: Diagnosis present

## 2015-05-02 DIAGNOSIS — I639 Cerebral infarction, unspecified: Secondary | ICD-10-CM

## 2015-05-02 DIAGNOSIS — Z823 Family history of stroke: Secondary | ICD-10-CM

## 2015-05-02 DIAGNOSIS — E538 Deficiency of other specified B group vitamins: Principal | ICD-10-CM | POA: Diagnosis present

## 2015-05-02 DIAGNOSIS — R479 Unspecified speech disturbances: Secondary | ICD-10-CM | POA: Diagnosis present

## 2015-05-02 DIAGNOSIS — Z955 Presence of coronary angioplasty implant and graft: Secondary | ICD-10-CM

## 2015-05-02 DIAGNOSIS — K219 Gastro-esophageal reflux disease without esophagitis: Secondary | ICD-10-CM | POA: Diagnosis present

## 2015-05-02 DIAGNOSIS — I2510 Atherosclerotic heart disease of native coronary artery without angina pectoris: Secondary | ICD-10-CM | POA: Diagnosis present

## 2015-05-02 DIAGNOSIS — F329 Major depressive disorder, single episode, unspecified: Secondary | ICD-10-CM | POA: Diagnosis present

## 2015-05-02 DIAGNOSIS — Z79891 Long term (current) use of opiate analgesic: Secondary | ICD-10-CM

## 2015-05-02 DIAGNOSIS — M5137 Other intervertebral disc degeneration, lumbosacral region: Secondary | ICD-10-CM | POA: Diagnosis present

## 2015-05-02 DIAGNOSIS — M6281 Muscle weakness (generalized): Secondary | ICD-10-CM

## 2015-05-02 DIAGNOSIS — I10 Essential (primary) hypertension: Secondary | ICD-10-CM | POA: Diagnosis present

## 2015-05-02 HISTORY — DX: Gastro-esophageal reflux disease without esophagitis: K21.9

## 2015-05-02 HISTORY — DX: Atherosclerotic heart disease of native coronary artery without angina pectoris: I25.10

## 2015-05-03 ENCOUNTER — Inpatient Hospital Stay: Payer: Medicaid Other

## 2015-05-03 ENCOUNTER — Encounter: Payer: Self-pay | Admitting: Emergency Medicine

## 2015-05-03 ENCOUNTER — Emergency Department: Payer: Medicaid Other

## 2015-05-03 ENCOUNTER — Inpatient Hospital Stay
Admit: 2015-05-03 | Discharge: 2015-05-03 | Disposition: A | Discharge status: Home / Self Care | Payer: Medicaid Other | Attending: Internal Medicine | Admitting: Internal Medicine

## 2015-05-03 DIAGNOSIS — Z79891 Long term (current) use of opiate analgesic: Secondary | ICD-10-CM | POA: Diagnosis not present

## 2015-05-03 DIAGNOSIS — R479 Unspecified speech disturbances: Secondary | ICD-10-CM | POA: Diagnosis present

## 2015-05-03 DIAGNOSIS — F17210 Nicotine dependence, cigarettes, uncomplicated: Secondary | ICD-10-CM | POA: Diagnosis present

## 2015-05-03 DIAGNOSIS — Z8546 Personal history of malignant neoplasm of prostate: Secondary | ICD-10-CM | POA: Diagnosis not present

## 2015-05-03 DIAGNOSIS — M6281 Muscle weakness (generalized): Secondary | ICD-10-CM | POA: Diagnosis present

## 2015-05-03 DIAGNOSIS — E538 Deficiency of other specified B group vitamins: Secondary | ICD-10-CM | POA: Diagnosis not present

## 2015-05-03 DIAGNOSIS — I639 Cerebral infarction, unspecified: Secondary | ICD-10-CM | POA: Diagnosis present

## 2015-05-03 DIAGNOSIS — Z823 Family history of stroke: Secondary | ICD-10-CM | POA: Diagnosis not present

## 2015-05-03 DIAGNOSIS — M5137 Other intervertebral disc degeneration, lumbosacral region: Secondary | ICD-10-CM | POA: Diagnosis present

## 2015-05-03 DIAGNOSIS — R531 Weakness: Secondary | ICD-10-CM | POA: Diagnosis present

## 2015-05-03 DIAGNOSIS — I10 Essential (primary) hypertension: Secondary | ICD-10-CM | POA: Diagnosis present

## 2015-05-03 DIAGNOSIS — F419 Anxiety disorder, unspecified: Secondary | ICD-10-CM | POA: Diagnosis present

## 2015-05-03 DIAGNOSIS — M542 Cervicalgia: Secondary | ICD-10-CM | POA: Diagnosis present

## 2015-05-03 DIAGNOSIS — F10129 Alcohol abuse with intoxication, unspecified: Secondary | ICD-10-CM | POA: Diagnosis present

## 2015-05-03 DIAGNOSIS — Z9861 Coronary angioplasty status: Secondary | ICD-10-CM | POA: Diagnosis not present

## 2015-05-03 DIAGNOSIS — Z955 Presence of coronary angioplasty implant and graft: Secondary | ICD-10-CM | POA: Diagnosis not present

## 2015-05-03 DIAGNOSIS — K219 Gastro-esophageal reflux disease without esophagitis: Secondary | ICD-10-CM | POA: Diagnosis present

## 2015-05-03 DIAGNOSIS — I2510 Atherosclerotic heart disease of native coronary artery without angina pectoris: Secondary | ICD-10-CM | POA: Diagnosis present

## 2015-05-03 DIAGNOSIS — F329 Major depressive disorder, single episode, unspecified: Secondary | ICD-10-CM | POA: Diagnosis present

## 2015-05-03 LAB — COMPREHENSIVE METABOLIC PANEL
ALBUMIN: 4.3 g/dL (ref 3.5–5.0)
ALT: 71 U/L — ABNORMAL HIGH (ref 17–63)
AST: 57 U/L — AB (ref 15–41)
Alkaline Phosphatase: 71 U/L (ref 38–126)
Anion gap: 10 (ref 5–15)
BUN: 12 mg/dL (ref 6–20)
CHLORIDE: 105 mmol/L (ref 101–111)
CO2: 25 mmol/L (ref 22–32)
CREATININE: 0.89 mg/dL (ref 0.61–1.24)
Calcium: 9 mg/dL (ref 8.9–10.3)
GFR calc Af Amer: >60 mL/min (ref >60)
GFR calc non Af Amer: >60 mL/min (ref >60)
GLUCOSE: 116 mg/dL — AB (ref 65–99)
Potassium: 3.9 mmol/L (ref 3.5–5.1)
Sodium: 140 mmol/L (ref 135–145)
Total Bilirubin: 0.6 mg/dL (ref 0.3–1.2)
Total Protein: 7.4 g/dL (ref 6.5–8.1)

## 2015-05-03 LAB — URINE DRUG SCREEN, QUALITATIVE (ARMC ONLY)
AMPHETAMINES, UR SCREEN: NOT DETECTED
Barbiturates, Ur Screen: NOT DETECTED
Benzodiazepine, Ur Scrn: NOT DETECTED
COCAINE METABOLITE, UR ~~LOC~~: POSITIVE — AB
Cannabinoid 50 Ng, Ur ~~LOC~~: NOT DETECTED
MDMA (ECSTASY) UR SCREEN: NOT DETECTED
Methadone Scn, Ur: NOT DETECTED
Opiate, Ur Screen: NOT DETECTED
PHENCYCLIDINE (PCP) UR S: NOT DETECTED
Tricyclic, Ur Screen: NOT DETECTED

## 2015-05-03 LAB — CBC WITH DIFFERENTIAL/PLATELET
Basophils Absolute: 0.1 10*3/uL (ref 0–0.1)
Basophils Relative: 1 %
EOS ABS: 0.3 10*3/uL (ref 0–0.7)
Eosinophils Relative: 2 %
HEMATOCRIT: 51.5 % (ref 40.0–52.0)
Hemoglobin: 17 g/dL (ref 13.0–18.0)
Lymphocytes Relative: 32 %
Lymphs Abs: 4.5 10*3/uL — ABNORMAL HIGH (ref 1.0–3.6)
MCH: 29.6 pg (ref 26.0–34.0)
MCHC: 33.1 g/dL (ref 32.0–36.0)
MCV: 89.5 fL (ref 80.0–100.0)
Monocytes Absolute: 0.8 10*3/uL (ref 0.2–1.0)
Monocytes Relative: 6 %
NEUTROS ABS: 8.2 10*3/uL — AB (ref 1.4–6.5)
Neutrophils Relative %: 59 %
PLATELETS: 232 10*3/uL (ref 150–440)
RBC: 5.75 MIL/uL (ref 4.40–5.90)
RDW: 13.2 % (ref 11.5–14.5)
WBC: 13.9 10*3/uL — ABNORMAL HIGH (ref 3.8–10.6)

## 2015-05-03 LAB — TROPONIN I: Troponin I: <0.03 ng/mL (ref <0.031)

## 2015-05-03 LAB — ETHANOL: ALCOHOL ETHYL (B): 210 mg/dL — AB (ref <5)

## 2015-05-03 LAB — LIPASE, BLOOD: Lipase: 28 U/L (ref 22–51)

## 2015-05-03 MED ORDER — ENOXAPARIN SODIUM 100 MG/ML ~~LOC~~ SOLN
SUBCUTANEOUS | Status: AC
  Administered 2015-05-03: 40 mg via SUBCUTANEOUS
  Filled 2015-05-03: qty 1

## 2015-05-03 MED ORDER — ALBUTEROL SULFATE (2.5 MG/3ML) 0.083% IN NEBU
INHALATION_SOLUTION | RESPIRATORY_TRACT | Status: AC
  Administered 2015-05-03: 2.5 mg via RESPIRATORY_TRACT
  Filled 2015-05-03: qty 3

## 2015-05-03 MED ORDER — SODIUM CHLORIDE 0.9 % IV SOLN
INTRAVENOUS | Status: DC
  Administered 2015-05-03: 1 mL via INTRAVENOUS
  Administered 2015-05-03: 05:00:00 via INTRAVENOUS

## 2015-05-03 MED ORDER — SODIUM CHLORIDE 0.9 % IJ SOLN
3.0000 mL | Freq: Two times a day (BID) | INTRAMUSCULAR | Status: DC
  Administered 2015-05-03 – 2015-05-04 (×2): 3 mL via INTRAVENOUS

## 2015-05-03 MED ORDER — LORAZEPAM 2 MG/ML IJ SOLN: 0.0000 mg | Freq: Four times a day (QID) | INTRAMUSCULAR | Status: DC

## 2015-05-03 MED ORDER — OXYCODONE-ACETAMINOPHEN 5-325 MG PO TABS
1.0000 | ORAL_TABLET | ORAL | Status: DC | PRN
  Administered 2015-05-03 – 2015-05-07 (×9): 1 via ORAL
  Filled 2015-05-03 (×9): qty 1

## 2015-05-03 MED ORDER — ONDANSETRON HCL 4 MG PO TABS: 4.0000 mg | ORAL_TABLET | Freq: Four times a day (QID) | ORAL | Status: DC | PRN

## 2015-05-03 MED ORDER — PANTOPRAZOLE SODIUM 40 MG PO TBEC
DELAYED_RELEASE_TABLET | ORAL | Status: AC
  Administered 2015-05-03: 40 mg via ORAL
  Filled 2015-05-03: qty 1

## 2015-05-03 MED ORDER — CLONAZEPAM 1 MG PO TABS
1.0000 mg | ORAL_TABLET | Freq: Two times a day (BID) | ORAL | Status: DC | PRN
  Administered 2015-05-03 – 2015-05-06 (×5): 1 mg via ORAL
  Filled 2015-05-03 (×5): qty 1

## 2015-05-03 MED ORDER — HYDROXYZINE HCL 25 MG PO TABS
50.0000 mg | ORAL_TABLET | Freq: Two times a day (BID) | ORAL | Status: DC | PRN
  Administered 2015-05-04: 50 mg via ORAL
  Filled 2015-05-03: qty 1

## 2015-05-03 MED ORDER — ALBUTEROL SULFATE HFA 108 (90 BASE) MCG/ACT IN AERS: 2.0000 | INHALATION_SPRAY | RESPIRATORY_TRACT | Status: DC

## 2015-05-03 MED ORDER — LISINOPRIL 10 MG PO TABS
10.0000 mg | ORAL_TABLET | Freq: Every day | ORAL | Status: DC
  Administered 2015-05-03 – 2015-05-07 (×5): 10 mg via ORAL
  Filled 2015-05-03 (×4): qty 1

## 2015-05-03 MED ORDER — ALBUTEROL SULFATE (2.5 MG/3ML) 0.083% IN NEBU
2.5000 mg | INHALATION_SOLUTION | RESPIRATORY_TRACT | Status: DC | PRN
  Administered 2015-05-03 – 2015-05-04 (×2): 2.5 mg via RESPIRATORY_TRACT
  Filled 2015-05-03: qty 3

## 2015-05-03 MED ORDER — LORAZEPAM 2 MG PO TABS: 0.0000 mg | ORAL_TABLET | Freq: Four times a day (QID) | ORAL | Status: DC

## 2015-05-03 MED ORDER — ASPIRIN 81 MG PO CHEW
CHEWABLE_TABLET | ORAL | Status: AC
  Administered 2015-05-03: 324 mg via ORAL
  Filled 2015-05-03: qty 4

## 2015-05-03 MED ORDER — LORAZEPAM 2 MG/ML IJ SOLN
1.0000 mg | Freq: Once | INTRAMUSCULAR | Status: AC
  Administered 2015-05-03: 1 mg via INTRAVENOUS

## 2015-05-03 MED ORDER — LORAZEPAM 2 MG/ML IJ SOLN
INTRAMUSCULAR | Status: AC
  Administered 2015-05-03: 1 mg via INTRAVENOUS
  Filled 2015-05-03: qty 1

## 2015-05-03 MED ORDER — PANTOPRAZOLE SODIUM 40 MG PO TBEC
40.0000 mg | DELAYED_RELEASE_TABLET | Freq: Every day | ORAL | Status: DC
  Administered 2015-05-03: 40 mg via ORAL

## 2015-05-03 MED ORDER — QUETIAPINE FUMARATE ER 200 MG PO TB24
400.0000 mg | ORAL_TABLET | Freq: Every day | ORAL | Status: DC
  Administered 2015-05-03 – 2015-05-06 (×5): 400 mg via ORAL
  Filled 2015-05-03: qty 2
  Filled 2015-05-03: qty 1
  Filled 2015-05-03 (×3): qty 2

## 2015-05-03 MED ORDER — ASPIRIN 81 MG PO CHEW
324.0000 mg | CHEWABLE_TABLET | Freq: Once | ORAL | Status: AC
  Administered 2015-05-03: 324 mg via ORAL

## 2015-05-03 MED ORDER — LORAZEPAM 2 MG/ML IJ SOLN: 1.0000 mg | Freq: Three times a day (TID) | INTRAMUSCULAR | Status: DC | PRN

## 2015-05-03 MED ORDER — QUETIAPINE FUMARATE 25 MG PO TABS
50.0000 mg | ORAL_TABLET | Freq: Every day | ORAL | Status: DC
  Administered 2015-05-03 – 2015-05-07 (×5): 50 mg via ORAL
  Filled 2015-05-03 (×6): qty 2

## 2015-05-03 MED ORDER — LORAZEPAM 2 MG/ML IJ SOLN
1.0000 mg | Freq: Three times a day (TID) | INTRAMUSCULAR | Status: DC | PRN
  Administered 2015-05-03 – 2015-05-05 (×3): 1 mg via INTRAVENOUS
  Filled 2015-05-03 (×2): qty 1

## 2015-05-03 MED ORDER — ONDANSETRON HCL 4 MG/2ML IJ SOLN: 4.0000 mg | Freq: Four times a day (QID) | INTRAMUSCULAR | Status: DC | PRN

## 2015-05-03 MED ORDER — VITAMIN B-1 100 MG PO TABS
ORAL_TABLET | ORAL | Status: AC
  Administered 2015-05-03: 100 mg via ORAL
  Filled 2015-05-03: qty 1

## 2015-05-03 MED ORDER — LORAZEPAM 2 MG/ML IJ SOLN
INTRAMUSCULAR | Status: AC
  Filled 2015-05-03: qty 1

## 2015-05-03 MED ORDER — ENOXAPARIN SODIUM 40 MG/0.4ML ~~LOC~~ SOLN
40.0000 mg | SUBCUTANEOUS | Status: DC
  Administered 2015-05-03 – 2015-05-07 (×5): 40 mg via SUBCUTANEOUS
  Filled 2015-05-03 (×5): qty 0.4

## 2015-05-03 MED ORDER — CLONAZEPAM 0.5 MG PO TABS
0.5000 mg | ORAL_TABLET | Freq: Every day | ORAL | Status: DC
  Administered 2015-05-03 – 2015-05-06 (×4): 0.5 mg via ORAL
  Filled 2015-05-03 (×4): qty 1

## 2015-05-03 MED ORDER — VITAMIN B-1 100 MG PO TABS: 100.0000 mg | ORAL_TABLET | Freq: Every day | ORAL | Status: DC

## 2015-05-03 MED ORDER — THIAMINE HCL 100 MG/ML IJ SOLN
100.0000 mg | Freq: Every day | INTRAMUSCULAR | Status: DC
  Filled 2015-05-03: qty 1

## 2015-05-03 MED ORDER — CITALOPRAM HYDROBROMIDE 20 MG PO TABS
20.0000 mg | ORAL_TABLET | Freq: Every day | ORAL | Status: DC
  Administered 2015-05-03 – 2015-05-06 (×4): 20 mg via ORAL
  Filled 2015-05-03 (×5): qty 1

## 2015-05-03 MED ORDER — SUCRALFATE 1 GM/10ML PO SUSP
1.0000 g | Freq: Four times a day (QID) | ORAL | Status: DC
  Administered 2015-05-03 (×3): 1 g via ORAL
  Filled 2015-05-03 (×6): qty 10

## 2015-05-03 MED ORDER — ASPIRIN EC 81 MG PO TBEC
81.0000 mg | DELAYED_RELEASE_TABLET | Freq: Every day | ORAL | Status: DC
  Administered 2015-05-04 – 2015-05-07 (×4): 81 mg via ORAL
  Filled 2015-05-03 (×4): qty 1

## 2015-05-03 MED ORDER — LISINOPRIL 20 MG PO TABS
ORAL_TABLET | ORAL | Status: AC
  Administered 2015-05-03: 10 mg via ORAL
  Filled 2015-05-03: qty 1

## 2015-05-03 MED ORDER — GADOBENATE DIMEGLUMINE 529 MG/ML IV SOLN
20.0000 mL | Freq: Once | INTRAVENOUS | Status: AC | PRN
  Administered 2015-05-03: 20 mL via INTRAVENOUS

## 2015-05-03 NOTE — ED Notes (Signed)
When pt falls asleep oxygen is noted to drop to 90-91%; placed on 2L oxygen via [place]; side rails up x 2 with callbell in reach; no requests at this time; says he can feel his anxiety improving

## 2015-05-03 NOTE — ED Provider Notes (Signed)
[facility] Emergency Department Provider Note  ____________________________________________  Time seen: Approximately 12:12 AM  I have reviewed the triage vital signs and the nursing notes.   HISTORY  Chief Complaint Aphasia; Chest Pain; and Alcohol Intoxication  History limited by intoxication and difficulty speaking  HPI Ian [name] is a 54 y.o. male who presents via EMS for a chief complain of chest pain and weakness. EMS reports patient called 911 and reference to chest pain. Upon their arrival, EMS noted numerous empty beer cans on floor in patient's home. Patient admits to heavy alcohol use tonight. Patient thought he had a panic attack after onset of chest pain. EMS reports patient with left side weakness. Time of onset unclear. Patient denies fall/injury/trauma. States onset of chest pain and weakness lasting greater than 3 hours. Unable to rate severity, quality and context of symptoms given his difficulty speaking.   Past Medical History  Diagnosis Date  . Cancer   . Hypertension   . Depression   . Anxiety   . Coronary artery disease   . GERD (gastroesophageal reflux disease)   Alcohol abuse Cocaine use  Patient Active Problem List   Diagnosis Date Noted  . Acute left-sided weakness 05/03/2015  . CVA (cerebral infarction) 05/03/2015  . Weakness 11/16/2013  . Chest pain 11/16/2013  . HTN (hypertension) 11/16/2013  . Left-sided weakness 11/16/2013  . 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 Hemiplegia, unspecified, affecting nondominant side 11/15/2013  . Speech and language deficits 11/15/2013    Past Surgical History  Procedure Laterality Date  . Appendectomy    . Cholecystectomy    . Angioplasty    . Cardiac catheterization      Current Outpatient Rx  Name  Route  Sig  Dispense  Refill  . clonazePAM (KLONOPIN) 0.5 MG tablet   Oral   Take 1 mg by mouth 2 (two) times daily as needed for anxiety.         . citalopram (CELEXA) 20  MG tablet   Oral   Take 20 mg by mouth at bedtime.      0   . clonazePAM (KLONOPIN) 0.5 MG tablet   Oral   Take 0.5 mg by mouth at bedtime.       0   . hydrOXYzine (ATARAX/VISTARIL) 50 MG tablet   Oral   Take 1 tablet by mouth 2 (two) times daily as needed.      0   . lisinopril (PRINIVIL,ZESTRIL) 10 MG tablet   Oral   Take 10 mg by mouth daily.      0   . omeprazole (PRILOSEC) 40 MG capsule   Oral   Take 40 mg by mouth 2 (two) times daily.         Marland Kitchen oxyCODONE-acetaminophen (PERCOCET/ROXICET) 5-325 MG per tablet   Oral   Take 1 tablet by mouth every 4 (four) hours as needed for severe pain.         Marland Kitchen PROAIR HFA 108 (90 BASE) MCG/ACT inhaler            0     Dispense as written.   Marland Kitchen QUEtiapine (SEROQUEL) 300 MG tablet   Oral   Take 1 tablet (300 mg total) by mouth at bedtime. Patient not taking: Reported on 05/03/2015   30 tablet   0   . QUEtiapine (SEROQUEL) 50 MG tablet   Oral   Take 1 tablet (50 mg total) by mouth daily at 12 noon.   30 tablet  0   . SEROQUEL XR 400 MG 24 hr tablet   Oral   Take 400 mg by mouth at bedtime.      0     Dispense as written.   . sucralfate (CARAFATE) 1 GM/10ML suspension   Oral   Take 1 g by mouth 4 (four) times daily.           Allergies Fluoxetine and Hydrocodone-acetaminophen  Family History  Problem Relation Age of Onset  . Stroke Father   . Leukemia Father   . Breast cancer Sister     Social History History  Substance Use Topics  . Smoking status: Current Every Day Smoker  . Smokeless tobacco: Not on file  . Alcohol Use: Yes    Review of Systems Constitutional: No fever/chills Eyes: No visual changes. ENT: No sore throat. Cardiovascular: Positive for chest pain. Respiratory: Denies shortness of breath. Gastrointestinal: No abdominal pain.  No nausea, no vomiting.  No diarrhea.  No constipation. Genitourinary: Negative for dysuria. Musculoskeletal: Negative for back pain. Skin:  Negative for rash. Neurological: Negative for headaches. Positive for left-sided weakness. Positive for facial numbness and tingling.  10-point ROS otherwise negative. ROS limited by intoxication and difficulty speaking.  ____________________________________________   PHYSICAL EXAM:  VITAL SIGNS: ED Triage Vitals  Enc Vitals Group     BP --      Pulse --      Resp --      Temp --      Temp src --      SpO2 --      Weight --      Height --      Head Cir --      Peak Flow --      Pain Score --      Pain Loc --      Pain Edu? --      Excl. in [place]? --     Constitutional: Alert, disheveled, anxious in mild distress. Eyes: Conjunctivae are bloodshot. PERRL. EOMI. Head: Atraumatic. Nose: No congestion/rhinnorhea. Mouth/Throat: Mucous membranes are moist. Edentulous. Neck: No stridor. No cervical spine tenderness to palpation. Cardiovascular: Normal rate, regular rhythm. Grossly normal heart sounds.  Good peripheral circulation. Respiratory: Normal respiratory effort.  No retractions. Lungs CTAB. Gastrointestinal: Soft and mildly tender to epigastrium. No distention. No abdominal bruits. No CVA tenderness. Musculoskeletal: No lower extremity tenderness nor edema.  No joint effusions. Neurologic:  Dysarthric. Mild right facial droop noted. Patient shaking and waving his right upper extremity. Left upper extremity and lower extremity flaccid.  Skin:  Skin is warm, dry and intact. No rash noted. Psychiatric: Mood and affect are normal. Intoxicated.  ____________________________________________   LABS (all labs ordered are listed, but only abnormal results are displayed)  Labs Reviewed  CBC WITH DIFFERENTIAL/PLATELET - Abnormal; Notable for the following:    WBC 13.9 (*)    Neutro Abs 8.2 (*)    Lymphs Abs 4.5 (*)    All other components within normal limits  COMPREHENSIVE METABOLIC PANEL - Abnormal; Notable for the following:    Glucose, Bld 116 (*)    AST 57 (*)    ALT  71 (*)    All other components within normal limits  ETHANOL - Abnormal; Notable for the following:    Alcohol, Ethyl (B) 210 (*)    All other components within normal limits  LIPASE, BLOOD  TROPONIN I  CREATININE, SERUM   ____________________________________________  EKG  ED ECG REPORT   Date:  05/03/2015  EKG Time: 2355  Rate: 95  Rhythm: normal EKG, normal sinus rhythm  Axis: Normal  Intervals:none  ST&T Change: Nonspecific  ____________________________________________  RADIOLOGY  CT head without contrast interpreted per Dr. Gerilyn Nestle: No acute intracranial abnormalities.  Portable chest (reviewed by me, interpreted by Dr. Alroy Dust): No active disease. ____________________________________________   PROCEDURES  Procedure(s) performed: None  Critical Care performed: No  ____________________________________________   INITIAL IMPRESSION / ASSESSMENT AND PLAN / ED COURSE  Pertinent labs & imaging results that were available during my care of the patient were reviewed by me and considered in my medical decision making (see chart for details).  54 year old male, intoxicated, presents via EMS for left sided weakness; prior history of CVA. IV Ativan given for anxiety and right upper extremity tremors. Stat head CT to evaluate intracranial hemorrhage, check labs, reassess. Anticipate hospitalization for probable CVA.  ----------------------------------------- 12:31 AM on 05/03/2015 -----------------------------------------  Patient returns from CT now with clear and comprehensible speech. Tremors improved and right upper extremity. No movement noted from left upper or lower extremity.  ----------------------------------------- 2:01 AM on 05/03/2015 -----------------------------------------  Patient complains of increasing anxiety and chest pain. Patient's tremor and speech noted to be worse with heightened anxiety. Left upper arm and left lower extremity remains  flaccid. Patient now states he was golfing earlier in the afternoon when his buddies noted his left side was weak. Patient remains out of the window for TPA. Will place patient on CIWA given his history of heavy alcohol use. ____________________________________________   FINAL CLINICAL IMPRESSION(S) / ED DIAGNOSES  Final diagnoses:  CVA (cerebral vascular accident)  Left-sided weakness  Alcohol intoxication, uncomplicated  Chest pain, unspecified chest pain type      Paulette Blanch, MD 05/03/15 0745

## 2015-05-03 NOTE — ED Notes (Signed)
Ed tech reports pt using callbell; asking for medication for anxiety

## 2015-05-03 NOTE — Progress Notes (Signed)
PT Cancellation Note  Patient Details Name: ASIF MUCHOW MRN: [id number] DOB: 09/30/1961   Cancelled Treatment:    Reason Eval/Treat Not Completed: Medical issues which prohibited therapy (Consult received and chart reviewed.  Patient pending MRI to rule out cervical pathology.  Will hold until results received and patient fully cleared for activity with skilled PT evaluation.)   Kaymen Adrian H. Owens Shark, PT, DPT 05/03/2015, 2:19 PM [phone]

## 2015-05-03 NOTE — ED Notes (Signed)
Patient transported to MRI 

## 2015-05-03 NOTE — ED Notes (Signed)
Pt back to room; assisted with urinal to void; speech clear and concise at this time;

## 2015-05-03 NOTE — H&P (Addendum)
[facility] Hospitalists History and Physical  Ian [name] MKL:[id number] DOB: June 22, 1961 DOA: 05/02/2015  Referring physician: Lurline Hare PCP: No PCP Per Patient   Chief Complaint: Left-sided weakness and numbness                               Speech disturbances  HPI: Ian [name] is a 54 y.o. male with below past medical history presents to the emergency room via EMS with the complaints of acute onset of left-sided weakness and numbness which started around 4 PM yesterday. He mentions that he was at a golf course along with his friends and his friends noticed him to have left-sided weakness, was taken to his home by his friend and subsequently he got worsening of the left-sided weakness with associated numbness and speech difficulties hence was brought to the emergency room for further evaluation. In the emergency room on arrival patient was noted to have left-sided weakness with  flaccidity on the left upper and lower extremity and was also noted  to have speech difficulties. Workup revealed that the CT head negative for any acute intracranial pathology including intracranial bleed. Lab work significant for elevated ethanol level of 210. Patient was noted to have elevated blood pressure on arrival currently his blood pressure is in stable range.He was also noted to have anxiety episodes on and off while in the emergency room. Denies any chest pain, shortness of breath, nausea vomiting diarrhea constipation or abdominal pain.  Past Medical History  Diagnosis Date  . Cancer   . Hypertension   . Depression   . Anxiety   . Coronary artery disease   . GERD (gastroesophageal reflux disease)     Past Surgical History  Procedure Laterality Date  . Appendectomy    . Cholecystectomy    . Angioplasty    . Cardiac catheterization      Family History  Problem Relation Age of Onset  . Stroke Father   . Leukemia Father   . Breast cancer Sister     Social History  reports that he has been  smoking.  He does not have any smokeless tobacco history on file. He reports that he drinks alcohol. He reports that he uses illicit drugs (Cocaine and Marijuana).  Allergies  Allergen Reactions  . Fluoxetine Swelling    Anxiety, itching  . Hydrocodone-Acetaminophen Itching    Prior to Admission medications   Medication Sig Start Date End Date Taking? Authorizing Provider  clonazePAM (KLONOPIN) 0.5 MG tablet Take 1 mg by mouth 2 (two) times daily as needed for anxiety.   Yes Historical Provider, MD  citalopram (CELEXA) 20 MG tablet Take 20 mg by mouth at bedtime. 04/12/15   Historical Provider, MD  clonazePAM (KLONOPIN) 0.5 MG tablet Take 0.5 mg by mouth at bedtime.  02/05/15   Historical Provider, MD  hydrOXYzine (ATARAX/VISTARIL) 50 MG tablet Take 1 tablet by mouth 2 (two) times daily as needed. 03/01/15   Historical Provider, MD  lisinopril (PRINIVIL,ZESTRIL) 10 MG tablet Take 10 mg by mouth daily. 03/01/15   Historical Provider, MD  omeprazole (PRILOSEC) 40 MG capsule Take 40 mg by mouth 2 (two) times daily.    Historical Provider, MD  oxyCODONE-acetaminophen (PERCOCET/ROXICET) 5-325 MG per tablet Take 1 tablet by mouth every 4 (four) hours as needed for severe pain.    Historical Provider, MD  PROAIR HFA 108 [phone] BASE) MCG/ACT inhaler  03/01/15   Historical Provider, MD  QUEtiapine (SEROQUEL) 300 MG tablet Take 1 tablet (300 mg total) by mouth at bedtime. Patient not taking: Reported on 05/03/2015 11/17/13   Nita Sells, MD  QUEtiapine (SEROQUEL) 50 MG tablet Take 1 tablet (50 mg total) by mouth daily at 12 noon. 11/17/13   Nita Sells, MD  SEROQUEL XR 400 MG 24 hr tablet Take 400 mg by mouth at bedtime. 02/05/15   Historical Provider, MD  sucralfate (CARAFATE) 1 GM/10ML suspension Take 1 g by mouth 4 (four) times daily.    Historical Provider, MD     Review of Systems:  Constitutional: No fevers, chills. Weakness and numbness of  left upper and lower extremity as noted in  history of present illness. Eyes: Positive for blurred vision. No pain, redness, inflammation. ENT: No tinnitus, ear pain, hearing loss, epistaxis, discharge, redness of oropharynx, difficulty swallowing. Resp: No cough, wheeze, hemoptysis, dyspnea, painful respiration. Cardio-vascular: No chest pain, orthopnea, edema, DOE, palpitations, syncope. GI: No nausea, vomiting, diarrhea, abdominal pain, hematemesis, melena, GERD, rectal bleeding, constipation. GU: No dysuria, hematuria, urgency, frequency, incontinence Endocrine: No polyuria, nocturia, heat or cold intolerance, thirst Hematologic/Lymphatic: No anemia, easy bruising bleeding, swollen glands Integumentary: No acne, rash, lesions. Musculoskeletal: No pain in: neck-back-shoulder-knee-hip, arthritis, gout, redness. Neuro: Numbness and weakness of left upper and lower extremity associated with speech disturbances which started around 4 PM yesterday. Psych: History of anxiety and her depression, patient on home medications.   Physical Exam: Constitutional: Filed Vitals:   05/03/15 0100 05/03/15 0151 05/03/15 0200 05/03/15 0209  BP: 136/88  132/98 132/98  Pulse: 59  90 88  Temp:  98.4 F (36.9 C)    TempSrc:      Resp: 24  24   SpO2: 94%  94%    Wt Readings from Last 3 Encounters:  No data found for Wt   General:  Well developed, well nourished,  in no apparent distress HEENT: PERRL, EOMI, no scleral icterus, no conjunctivitis, no difficulty hearing, no pharyngeal erythema, Mucosa - moist. Neck:  Supple, no masses, non tender, no adenopathy, no JVD, no Carotid bruit, no thyromegaly,FROM Cardiovascular: RRR, no m/r/g, no S3/S4, chest wall nontender, good pedal pulses, good femoral pulses, no LE edema. Respiratory: CTA bilaterally, no wheeze, no rales, no ronchi, breath sounds not diminished, breathing not labored, no increased respiratory effort. Abdomen: soft, nontender, nondistended, no mass, bowel sounds present and  normoactive, no hepatosplenomegaly. GU/Gyn: Not examined. Musculoskeletal:  no cyanosis/clubbing. Skin: no rash, no lesions, no erythema. Lymphatic: No adenopathy (cervical/axilla/inguinal/supraclavicular) Neurologic: Left-sided facial asymmetry present. Slurred speech present. Left upper and lower extremity flaccid with 0/5 power Psychiatric: Alert, Oriented (time, person, place, circumstance), Cooperative, Judgement good, Memory intact, not confused, not agitated, not depressed, cooperative         Labs on Admission:  Basic Metabolic Panel:  Recent Labs Lab 05/03/15 0042  NA 140  K 3.9  CL 105  CO2 25  GLUCOSE 116*  BUN 12  CREATININE 0.89  CALCIUM 9.0   Liver Function Tests:  Recent Labs Lab 05/03/15 0042  AST 57*  ALT 71*  ALKPHOS 71  BILITOT 0.6  PROT 7.4  ALBUMIN 4.3    Recent Labs Lab 05/03/15 0042  LIPASE 28   No results for input(s): AMMONIA in the last 168 hours. CBC:  Recent Labs Lab 05/03/15 0042  WBC 13.9*  NEUTROABS 8.2*  HGB 17.0  HCT 51.5  MCV 89.5  PLT 232   Cardiac Enzymes:  Recent Labs Lab 05/03/15 0042  TROPONINI <  0.03   BNP (last 3 results) No results for input(s): BNP in the last 8760 hours.  ProBNP (last 3 results) No results for input(s): PROBNP in the last 8760 hours.  CBG: No results for input(s): GLUCAP in the last 168 hours.  Radiological Exams on Admission: Ct Head Wo Contrast  05/03/2015   CLINICAL DATA:  Alcohol. Uncontrollable right hand movement and slurred speech. Left-sided numbness.  EXAM: CT HEAD WITHOUT CONTRAST  TECHNIQUE: Contiguous axial images were obtained from the base of the skull through the vertex without intravenous contrast.  COMPARISON:  12/02/2014  FINDINGS: Examination is limited due to motion artifact. No mass effect or midline shift. No abnormal extra-axial fluid collections. Gray-white matter junctions are distinct. Basal cisterns are not effaced. No evidence of acute intracranial  hemorrhage. No depressed skull fractures. Opacification of some of the ethmoid air cells with mucosal thickening in the maxillary antra. Mastoid air cells are not opacified. Old nasal bone fractures.  IMPRESSION: No acute intracranial abnormalities. Probable inflammatory changes in the paranasal sinuses.   Electronically Signed   By: Lucienne Capers M.D.   On: 05/03/2015 00:41   Dg Chest Port 1 View  05/03/2015   CLINICAL DATA:  Altered mental status. Left-sided weakness. Smoker.  EXAM: PORTABLE CHEST - 1 VIEW  COMPARISON:  02/25/2015  FINDINGS: AP portable views of the chest demonstrate no focal airspace consolidation or alveolar edema. The lungs are grossly clear. There is no large effusion or pneumothorax. Cardiac and mediastinal contours appear unremarkable.  IMPRESSION: No active disease.   Electronically Signed   By: Andreas Newport M.D.   On: 05/03/2015 00:43    EKG: Independently reviewed. Normal sinus rhythm with ventricular rate of 95 bpm. No acute ST-T changes.  Assessment/Plan Principal Problem:   Acute left-sided weakness Active Problems:   Speech and language deficits   HTN (hypertension)   CVA (cerebral infarction)   1. Left-sided weakness with speech disturbances, acute CVA. Prior history of CVA. Plan: Admit to telemetry, nothing by mouth, continue aspirin, neuro watch, order MRI of brain, carotid Dopplers, echo, OT and PT consultations, speech and swallow evaluation, neuro consultation for further advice. 2. Hypertension, stable on home medications, continue same. Monitor BP closely. 3. History of coronary artery disease status post angioplasty in the past. Patient stable no cardiac complaints at this time. 4. History of depression/anxiety. Continue home medications. 5. History of Barrett's esophagitis. Stable, continue PPI. 6. History of prostate cancer status post seed implantation. Stable. Monitor. 7. ETOH use. Monitor for withdrawls.  CIWA protocol.   Code Status:  Full code DVT Prophylaxis: SubQ lovenox.  Time spent on admission: 50 minutes.  Juluis Mire [facility] [place] Hospitalists

## 2015-05-03 NOTE — ED Notes (Signed)
Patient transported to CT 

## 2015-05-03 NOTE — Evaluation (Signed)
Chart review.  B12/folate, ESR, TSH ordered.  MRI of C-spine pending.  Continue ASA for now. Full consult in am.

## 2015-05-03 NOTE — Progress Notes (Signed)
Patient ID: Ian [name], male   DOB: 04-Oct-1961, 54 y.o.   MRN: [id number] [facility] [place] NAME: Ian [name]    MR#:  [id number]  DATE OF BIRTH:  [birth year]   PRIMARY CARE; NONE. MEDICAID CARD HAS [facility]  SUBJECTIVE:   Left upper and LE weakness acute on chronic for few weeks. Unable to move his whole left side REVIEW OF SYSTEMS:   Review of Systems  Constitutional: Negative for fever, chills and weight loss.  HENT: Negative for ear discharge, ear pain and nosebleeds.   Eyes: Negative for blurred vision, pain and discharge.  Respiratory: Negative for sputum production, shortness of breath, wheezing and stridor.   Cardiovascular: Negative for chest pain, palpitations, orthopnea and PND.  Gastrointestinal: Negative for nausea, vomiting, abdominal pain and diarrhea.  Genitourinary: Negative for urgency and frequency.  Musculoskeletal: Negative for back pain, joint pain and neck pain.  Neurological: Positive for weakness. Negative for sensory change, speech change, focal weakness and headaches.  Psychiatric/Behavioral: Negative for depression and hallucinations. The patient is not nervous/anxious.      DRUG ALLERGIES:   Allergies  Allergen Reactions  . Fluoxetine Swelling    Anxiety, itching  . Hydrocodone-Acetaminophen Itching    VITALS:  Blood pressure 152/96, pulse 81, temperature 98.4 F (36.9 C), resp. rate 16, SpO2 98 %.  PHYSICAL EXAMINATION:  GENERAL:  54 y.o.-year-old patient lying in the bed with no acute distress.  EYES: Pupils equal, round, reactive to light and accommodation. No scleral icterus. Extraocular muscles intact.  HEENT: Head atraumatic, normocephalic. Oropharynx and nasopharynx clear.  NECK:  Supple, no jugular venous distention. No thyroid enlargement, no tenderness.  LUNGS: Normal breath sounds bilaterally, no wheezing, rales,rhonchi or crepitation. No use of accessory muscles of  respiration.  CARDIOVASCULAR: S1, S2 normal. No murmurs, rubs, or gallops.  ABDOMEN: Soft, nontender, nondistended. Bowel sounds present. No organomegaly or mass.  EXTREMITIES: No pedal edema, cyanosis, or clubbing.  NEUROLOGIC: Cranial nerves II through XII are intact. -flaccid weakness on the left UE and LE, DTR + in all 4 extremities. -decreased sensation on left side PSYCHIATRIC: The patient is alert and oriented x 3. anxious SKIN: No obvious rash, lesion, or ulcer.    LABORATORY PANEL:   CBC  Recent Labs Lab 05/03/15 0042  WBC 13.9*  HGB 17.0  HCT 51.5  PLT 232   ------------------------------------------------------------------------------------------------------------------  Chemistries   Recent Labs Lab 05/03/15 0042  NA 140  K 3.9  CL 105  CO2 25  GLUCOSE 116*  BUN 12  CREATININE 0.89  CALCIUM 9.0  AST 57*  ALT 71*  ALKPHOS 71  BILITOT 0.6   ------------------------------------------------------------------------------------------------------------------  Cardiac Enzymes  Recent Labs Lab 05/03/15 0042  TROPONINI <0.03   ------------------------------------------------------------------------------------------------------------------  RADIOLOGY:  Ct Head Wo Contrast  05/03/2015   CLINICAL DATA:  Alcohol. Uncontrollable right hand movement and slurred speech. Left-sided numbness.  EXAM: CT HEAD WITHOUT CONTRAST  TECHNIQUE: Contiguous axial images were obtained from the base of the skull through the vertex without intravenous contrast.  COMPARISON:  12/02/2014  FINDINGS: Examination is limited due to motion artifact. No mass effect or midline shift. No abnormal extra-axial fluid collections. Gray-white matter junctions are distinct. Basal cisterns are not effaced. No evidence of acute intracranial hemorrhage. No depressed skull fractures. Opacification of some of the ethmoid air cells with mucosal thickening in the maxillary antra. Mastoid air cells are  not opacified. Old nasal bone fractures.  IMPRESSION:  No acute intracranial abnormalities. Probable inflammatory changes in the paranasal sinuses.   Electronically Signed   By: Lucienne Capers M.D.   On: 05/03/2015 00:41   Mr Jeri Cos ZO Contrast  05/03/2015   CLINICAL DATA:  Left-sided weakness and numbness. Speech disturbance. Initial encounter. Symptoms began 17 hours ago.  EXAM: MRI HEAD WITHOUT AND WITH CONTRAST  TECHNIQUE: Multiplanar, multiecho pulse sequences of the brain and surrounding structures were obtained without and with intravenous contrast.  CONTRAST:  33mL MULTIHANCE GADOBENATE DIMEGLUMINE 529 MG/ML IV SOLN  COMPARISON:  CT head without contrast from the same day.  FINDINGS: The diffusion-weighted images demonstrate no evidence for acute for subacute infarction. No acute hemorrhage or mass lesion is present. The study is mildly degraded by patient motion. Mild periventricular and subcortical T2 changes are present. The ventricles are of normal size. No significant extraaxial fluid collection is present.  Flow is present in the major intracranial arteries. Mild mucosal thickening is present in the right maxillary sinus and bilateral ethmoid air cells. There is some mucosal thickening in the sphenoid sinuses. The mastoid air cells are clear. The skullbase is within normal limits. Midline structures are normal.  The postcontrast images demonstrate no pathologic enhancement.  IMPRESSION: 1. No acute intracranial abnormality. 2. Scattered periventricular and subcortical T2 hyperintensities are slightly greater than expected for age. The finding is nonspecific but can be seen in the setting of chronic microvascular ischemia, a demyelinating process such as multiple sclerosis, vasculitis, complicated migraine headaches, or as the sequelae of a prior infectious or inflammatory process. 3. Mild mucosal thickening throughout the paranasal sinuses. No fluid levels are present.   Electronically Signed    By: San Morelle M.D.   On: 05/03/2015 09:16   US Carotid Bilateral  05/03/2015   CLINICAL DATA:  Left-sided numbness, possible cerebrovascular accident.  EXAM: BILATERAL CAROTID DUPLEX ULTRASOUND  TECHNIQUE: Pearline Cables scale imaging, color Doppler and duplex ultrasound were performed of bilateral carotid and vertebral arteries in the neck.  COMPARISON:  None.  FINDINGS: Criteria: Quantification of carotid stenosis is based on velocity parameters that correlate the residual internal carotid diameter with NASCET-based stenosis levels, using the diameter of the distal internal carotid lumen as the denominator for stenosis measurement.  The following velocity measurements were obtained:  RIGHT  ICA:  66/29 cm/sec  CCA:  10/96 cm/sec  SYSTOLIC ICA/CCA RATIO:  0.8  DIASTOLIC ICA/CCA RATIO:  1.1  ECA:  113 cm/sec  LEFT  ICA:  69/31 cm/sec  CCA:  04/54 cm/sec  SYSTOLIC ICA/CCA RATIO:  0.8  DIASTOLIC ICA/CCA RATIO:  1.3  ECA:  94 cm/sec  RIGHT CAROTID ARTERY: Mild plaque formation is noted in right carotid bulb and proximal right internal carotid artery consistent with less than 50% diameter stenosis based on ultrasound and Doppler criteria.  RIGHT VERTEBRAL ARTERY:  Antegrade flow is noted.  LEFT CAROTID ARTERY: Mild plaque formation is noted in the left carotid bulb and proximal left internal carotid artery consistent with less than 50% diameter stenosis based on ultrasound and Doppler criteria.  LEFT VERTEBRAL ARTERY:  Antegrade flow is noted.  Incidental note is made of 2 complex but predominantly cystic nodules in the right thyroid lobe.  IMPRESSION: No hemodynamically significant plaque or stenosis is noted in the cervical carotid arteries bilaterally.  Two complex but predominantly cystic nodules are noted in the right thyroid lobe. Thyroid ultrasound is recommended for further evaluation.   Electronically Signed   By: Marijo Conception, M.D.  On: 05/03/2015 10:06   Dg Chest Port 1 View  05/03/2015   CLINICAL  DATA:  Altered mental status. Left-sided weakness. Smoker.  EXAM: PORTABLE CHEST - 1 VIEW  COMPARISON:  02/25/2015  FINDINGS: AP portable views of the chest demonstrate no focal airspace consolidation or alveolar edema. The lungs are grossly clear. There is no large effusion or pneumothorax. Cardiac and mediastinal contours appear unremarkable.  IMPRESSION: No active disease.   Electronically Signed   By: Andreas Newport M.D.   On: 05/03/2015 00:43     ASSESSMENT AND PLAN:   1. Left-sided weakness with speech disturbances, acute CVA RULED out with negative MRI and CT head -continue aspirin.  Spoke with Dr Jayme [facility] and recommends MRI cervical spine to r/o cervical myelopathy -pt has had a few falls in the last few weeks and his weakness is progressively getting worse. -echo pending  2. History of coronary artery disease status post angioplasty in the past. Patient stable no cardiac complaints at this time. 3.   History of depression/anxiety. Continue home medications. 4. History of Barrett's esophagitis. Stable, continue PPI. 5. History of prostate cancer status post seed implantation. Stable. Monitor. 6. ETOH use. Monitor for withdrawls. CIWA protocol. 7. Hypertension, stable on home medications, continue same. Monitor BP closely    Code Status: Full code DVT Prophylaxis: SubQ lovenox.     All the records are reviewed and case discussed with Care Management/Social Workerr. Management plans discussed with the patient, family and they are in agreement.  CODE STATUS: Full code  TOTAL TIME TAKING CARE OF THIS PATIENT: 35 minutes.   D/C DEPENDING ON CLINICAL CONDITION.   Hamilton Marinello M.D on 05/03/2015 at 1:14 PM  Between 7am to 6pm - Pager - [phone]  After 6pm go to [email or web address] - password [email or web address] [facility]  [facility]  Office  [phone]  CC: Primary care physician; No PCP Per Patient

## 2015-05-03 NOTE — ED Notes (Signed)
Pt requesting pain medication for chest pain and medication for anxiety

## 2015-05-03 NOTE — ED Notes (Signed)
MD at bedside. 

## 2015-05-04 ENCOUNTER — Inpatient Hospital Stay: Payer: Medicaid Other

## 2015-05-04 LAB — TSH: TSH: 2.244 u[IU]/mL (ref 0.350–4.500)

## 2015-05-04 LAB — SEDIMENTATION RATE: Sed Rate: 1 mm/hr (ref 0–20)

## 2015-05-04 LAB — VITAMIN B12: Vitamin B-12: 173 pg/mL — ABNORMAL LOW (ref 180–914)

## 2015-05-04 LAB — FOLATE: Folate: 17.5 ng/mL (ref >5.9)

## 2015-05-04 LAB — CK: CK TOTAL: 76 U/L (ref 49–397)

## 2015-05-04 MED ORDER — GADOBENATE DIMEGLUMINE 529 MG/ML IV SOLN
20.0000 mL | Freq: Once | INTRAVENOUS | Status: AC | PRN
  Administered 2015-05-04: 20 mL via INTRAVENOUS

## 2015-05-04 MED ORDER — SODIUM CHLORIDE 0.9 % IJ SOLN
3.0000 mL | INTRAMUSCULAR | Status: DC | PRN
Start: 2015-05-04 — End: 2015-05-07

## 2015-05-04 MED ORDER — THIAMINE HCL 100 MG/ML IJ SOLN
100.0000 mg | Freq: Every day | INTRAMUSCULAR | Status: DC
  Administered 2015-05-04 – 2015-05-07 (×4): 100 mg via INTRAVENOUS
  Filled 2015-05-04 (×3): qty 2
  Filled 2015-05-04: qty 1

## 2015-05-04 MED ORDER — VITAMIN B-1 100 MG PO TABS: 100.0000 mg | ORAL_TABLET | Freq: Every day | ORAL | Status: DC

## 2015-05-04 MED ORDER — DEXAMETHASONE SODIUM PHOSPHATE 4 MG/ML IJ SOLN
4.0000 mg | Freq: Four times a day (QID) | INTRAMUSCULAR | Status: DC
Start: 2015-05-04 — End: 2015-05-05
  Administered 2015-05-04 – 2015-05-05 (×4): 4 mg via INTRAVENOUS
  Filled 2015-05-04 (×6): qty 1

## 2015-05-04 MED ORDER — SODIUM CHLORIDE 0.9 % IV SOLN
1.0000 mg | Freq: Once | INTRAVENOUS | Status: AC
  Administered 2015-05-04: 1 mg via INTRAVENOUS
  Filled 2015-05-04: qty 0.2

## 2015-05-04 MED ORDER — FAMOTIDINE 20 MG PO TABS
20.0000 mg | ORAL_TABLET | Freq: Every day | ORAL | Status: DC
  Administered 2015-05-04 – 2015-05-07 (×4): 20 mg via ORAL
  Filled 2015-05-04 (×4): qty 1

## 2015-05-04 NOTE — Evaluation (Signed)
Occupational Therapy Evaluation Patient Details Name: Ian [name] MRN: [id number] DOB: 1961/08/04 Today's Date: 05/04/2015    History of Present Illness 55 yo RHD M presents to [facility] after playing golf and noting some L sided weakness. Pt reports to me that he has had gradual L sided weakness over the past 6-8 weeks that has worsened.  CT and MRI negative for CVA at this time.  Neurologist to treat as CVA but is also concerned for conversion disorder.   Clinical Impression   Pt seen with PT to assess functional mobility for lying to sit, sit to stand and standing to transfer with SPC to chair.  Pt demonstrated unsafe behavior and reacher for counter and chair with supervision for LB dressing sitting EOB to don socks.  Independent with UB dressing for pull over shirt.  Inconsistent demonstration of use of LUE and hand.  He is able to make a fist and extend fingers but not able to demonstrate opposition.  His sensation is intact for LUE and hand as well as proprioception.  Rec SNF for continued rehab unless use of LUE and LLE improve and he is able to complete functional mobility safely.      Follow Up Recommendations  SNF (if pt improves with mobility, then rec HH OT--need to clarify if pt has a home to go to since dirt and smell  appears long standing)    Equipment Recommendations       Recommendations for Other Services       Precautions / Restrictions Precautions Precautions: Fall Restrictions Weight Bearing Restrictions: No      Mobility Bed Mobility Overal bed mobility: Needs Assistance Bed Mobility: Supine to Sit     Supine to sit: Min assist     General bed mobility comments: able to roll towards L side; Min A to elevate trunk off bed and verbal cues for sequencing.  Transfers Overall transfer level: Needs assistance Equipment used: Straight cane Transfers: Sit to/from [facility] Sit to Stand: Mod assist Stand pivot transfers: Mod assist       General transfer comment: Exaggerated forward lean with sit<>stand transfer, throwing himself forward; holds static standing balance with SBA and R UE support.    Balance Overall balance assessment: Needs assistance Sitting-balance support: Single extremity supported;Feet supported       Standing balance support: Single extremity supported                                ADL Overall ADL's : Needs assistance/impaired Eating/Feeding: Set up;Minimal assistance   Grooming: Wash/dry hands;Wash/dry face;Oral care;Applying deodorant;Brushing hair;Independent;Set up   Upper Body Bathing: Independent (for pull over shirt)           Lower Body Dressing: Minimal assistance Lower Body Dressing Details (indicate cue type and reason): pt able to don sock on RLE with supervision for balance when siting EOB and able to cross LLE over RLE to don L sock with increased effort but able to complete with supervision               General ADL Comments: pt unsafe and throws himself forward to get up off of bed to standing and reacher for counter and chair to get into chair from standing.  Inconsistent AROM of LLE noted and pt was up on his left toe when standing with cues to put weight thru and balance improved drastically which is not consitent with increased  tone or neuro involvement     Vision Vision Assessment?: No apparent visual deficits   Perception     Praxis      Pertinent Vitals/Pain Pain Assessment: No/denies pain     Hand Dominance Right   Extremity/Trunk Assessment Upper Extremity Assessment Upper Extremity Assessment: LUE deficits/detail LUE Deficits / Details: pt presents with inconsistent use of LUE and hand.  He is able to perform complete hand flexion and extension but when asked to perform opposition he could not.  He holds in LUE in a flexed position when standing but does not presnt with any incraesed tone at rest.  LUE Sensation:  (intact sensation but  states his arm feels like its tingling and gets worse when standing) LUE Coordination: decreased fine motor   Lower Extremity Assessment Lower Extremity Assessment: Defer to PT evaluation LLE Deficits / Details: Supine:  wiggles toes and slight contraction at hip/knee with heel slide LLE Sensation: decreased proprioception LLE Coordination: decreased gross motor       Communication Communication Communication: No difficulties   Cognition Arousal/Alertness: Awake/alert Behavior During Therapy: WFL for tasks assessed/performed Overall Cognitive Status: Within Functional Limits for tasks assessed                     General Comments       Exercises       Shoulder Instructions      Home Living Family/patient expects to be discharged to:: Private residence Living Arrangements: Alone     Home Access: Ramped entrance     Home Layout: One level     Bathroom Shower/Tub: Tub/shower unit Shower/tub characteristics: Architectural technologist: Standard Bathroom Accessibility: No   Home Equipment: None   Additional Comments: sleeps on couch      Prior Functioning/Environment Level of Independence: Independent        Comments: "works" at [employer] course    OT Diagnosis: Generalized weakness;Other (comment) (decreased but fluctuating muscle use on LUE and hand)   OT Problem List: Decreased strength;Decreased activity tolerance;Decreased safety awareness   OT Treatment/Interventions:      OT Goals(Current goals can be found in the care plan section) Acute Rehab OT Goals Patient Stated Goal: To get stronger OT Goal Formulation: With patient Time For Goal Achievement: 05/18/15 Potential to Achieve Goals: Good  OT Frequency:     Barriers to D/C:            Co-evaluation PT/OT/SLP Co-Evaluation/Treatment: Yes Reason for Co-Treatment: Complexity of the patient's impairments (multi-system involvement);For patient/therapist safety   OT goals addressed during  session: ADL's and self-care      End of Session Equipment Utilized During Treatment: Other (comment) [facility])  Activity Tolerance: Patient limited by fatigue Patient left: in chair;with chair alarm set;Other (comment) (SP therapist in room with pt doing bedside eval )   Time: 1400-1430 OT Time Calculation (min): 30 min Charges:  OT General Charges $OT Visit: 1 Procedure OT Evaluation $Initial OT Evaluation Tier I: 1 Procedure OT Treatments $Self Care/Home Management : 8-22 mins G-Codes:    Yona Stansbury 11-May-2015, 3:26 PM    Chrys Racer, OTR/L ascom [phone]

## 2015-05-04 NOTE — Evaluation (Signed)
Clinical/Bedside Swallow Evaluation Patient Details  Name: Ian [name] MRN: [id number] Date of Birth: 08-30-1961  Today's Date: 05/04/2015 Time: SLP Start Time (ACUTE ONLY): 1330 SLP Stop Time (ACUTE ONLY): 1430 SLP Time Calculation (min) (ACUTE ONLY): 60 min  Past Medical History:  Past Medical History  Diagnosis Date  . Cancer   . Hypertension   . Depression   . Anxiety   . Coronary artery disease   . GERD (gastroesophageal reflux disease)    Past Surgical History:  Past Surgical History  Procedure Laterality Date  . Appendectomy    . Cholecystectomy    . Angioplasty    . Cardiac catheterization     HPI:  pt reported a dx of Barrett's Esophagus and GERD; he is followed by a MD through [facility] and is taken to his appts by his Sister. Pt indicated he has f/u appts 1x month, and they are now monitoring him for "cells that have moved into my throat". He stated he has had trouble swallowing solids for some time; he described a feeling of the solids "getting stuck" in his throat. He addresses this w/ f/u swallows of a liquids "to help it down". Pt presents w/ LUE weakness and required assistance in opening carton. Noted L labial decreased tone. Pt reported he was easily anxious. Noted previous admission to this facilyt to the Behavior Med unit(ETOH, other drug substances).   Assessment / Plan / Recommendation Clinical Impression  Pt presented w/ adequate oropharyngeal phase swallow function w/ trials assessed at this eval; pt presents w/ reduced risk for aspiration w/ a mech soft diet w/ thin liquids following general aspiration precautions. Pt does have a h/o Esophageal dysmotility and Barrett's Esophagus which can increase risk for regurgitation and aspiration of reflux(pt stated this is baseline for him). Pt appears able to feed himself and follow through w/ general aspiration precautions but requires setup at meals d/t LUE weakness. Rec. ST f/u for toleration of diet next 2-3 days while  admitted; continued assessment of any oral phase deficits and/or Dysarthria of speech(though pt stated his speech was "ok"; clear and intellgible during conversation). NSG updated.     Aspiration Risk  None    Diet Recommendation Dysphagia 3 (Mech soft);Thin   Medication Administration: Whole meds with liquid (but give w/ Puree if easier for swallowing and clearing Esophageally) Compensations: Small sips/bites;Slow rate    Other  Recommendations Oral Care Recommendations: Oral care BID   Follow Up Recommendations       Frequency and Duration  2-3x  1 week   Pertinent Vitals/Pain denied    SLP Swallow Goals     Swallow Study Prior Functional Status       General Date of Onset: 05/02/15 Other Pertinent Information: pt reported a dx of Barrett's Esophagus and GERD; he is followed by a MD through [facility] and is taken to his appts by his Sister. Pt indicated he has f/u appts 1x month, and they are now monitoring him for "cells that have moved into my throat". He stated he has had trouble swallowing solids for some time; he described a feeling of the solids "getting stuck" in his throat. He addresses this w/ f/u swallows of a liquids "to help it down". Pt presents w/ LUE weakness and required assistance in opening carton. Noted L labial decreased tone. Pt reported he was easily anxious. Noted previous admission to this facilyt to the Behavior Med unit(ETOH, other drug substances). Type of Study: Bedside swallow evaluation Previous Swallow Assessment:  none indicated  Diet Prior to this Study: Regular Temperature Spikes Noted: No Respiratory Status: Room air History of Recent Intubation: No Behavior/Cognition: Alert;Cooperative;Pleasant mood;Other (Comment) (talkative ) Oral Cavity - Dentition: Missing dentition;Other (Comment) (upper dentition) Self-Feeding Abilities: Able to feed self;Needs assist;Needs set up;Other (Comment) (d/t LUE weakness) Patient Positioning: Upright in  chair/Tumbleform Baseline Vocal Quality: Normal Volitional Cough: Strong Volitional Swallow: Able to elicit    Oral/Motor/Sensory Function Labial ROM: Reduced left Labial Symmetry: Abnormal symmetry left Labial Strength: Reduced Labial Sensation: Reduced Lingual ROM: Reduced left Lingual Symmetry: Abnormal symmetry left Lingual Strength: Within Functional Limits Lingual Sensation: Within Functional Limits Facial Symmetry: Within Functional Limits Velum: Within Functional Limits Mandible: Within Functional Limits   Ice Chips Ice chips: Not tested   Thin Liquid Thin Liquid: Within functional limits Presentation: Cup;Straw;Self Fed Other Comments:  (pt drinking thin liquids post PT/OT session upon entering room; then consumed ~3-4 more ozs w/ SLP)    Nectar Thick Nectar Thick Liquid: Not tested   Honey Thick Honey Thick Liquid: Not tested   Puree Puree: Within functional limits Presentation: Self Fed;Spoon Other Comments: x3    Solid   GO    Solid: Within functional limits Presentation: Self Fed;Spoon Other Comments:  (8 trials of mac and cheese)       Jessi Jessop 05/04/2015,3:51 PM

## 2015-05-04 NOTE — Progress Notes (Signed)
Patient ID: Ian [name], male   DOB: [birth year], 54 y.o.   MRN: [id number] [place] [facility] [place] NAME: Ian [name]    MR#:  [id number]  DATE OF BIRTH:  1961/07/07   PRIMARY CARE; NONE. MEDICAID CARD HAS [facility]  SUBJECTIVE:  Came in with Left upper and LE weakness acute on chronic for few weeks. -left UE weakness much better -left LE unable to move REVIEW OF SYSTEMS:   Review of Systems  Constitutional: Negative for fever, chills and weight loss.  HENT: Negative for ear discharge, ear pain and nosebleeds.   Eyes: Negative for blurred vision, pain and discharge.  Respiratory: Negative for sputum production, shortness of breath, wheezing and stridor.   Cardiovascular: Negative for chest pain, palpitations, orthopnea and PND.  Gastrointestinal: Negative for nausea, vomiting, abdominal pain and diarrhea.  Genitourinary: Negative for urgency and frequency.  Musculoskeletal: Negative for back pain and joint pain.  Neurological: Positive for sensory change and focal weakness. Negative for tingling, speech change and weakness.  Psychiatric/Behavioral: Negative for depression. The patient is not nervous/anxious.   All other systems reviewed and are negative.    DRUG ALLERGIES:   Allergies  Allergen Reactions  . Fluoxetine Swelling    Anxiety, itching  . Hydrocodone-Acetaminophen Itching    VITALS:  Blood pressure 126/94, pulse 73, temperature 97.5 F (36.4 C), temperature source Oral, resp. rate 20, height 6' (1.829 m), weight 114.216 kg (251 lb 12.8 oz), SpO2 95 %.  PHYSICAL EXAMINATION:  GENERAL:  54 y.o.-year-old patient lying in the bed with no acute distress.  EYES: Pupils equal, round, reactive to light and accommodation. No scleral icterus. Extraocular muscles intact.  HEENT: Head atraumatic, normocephalic. Oropharynx and nasopharynx clear.  NECK:  Supple, no jugular venous distention. No thyroid enlargement,  no tenderness.  LUNGS: Normal breath sounds bilaterally, no wheezing, rales,rhonchi or crepitation. No use of accessory muscles of respiration.  CARDIOVASCULAR: S1, S2 normal. No murmurs, rubs, or gallops.  ABDOMEN: Soft, nontender, nondistended. Bowel sounds present. No organomegaly or mass.  EXTREMITIES: No pedal edema, cyanosis, or clubbing.  NEUROLOGIC: Cranial nerves II through XII are intact. -flaccid weakness on the left UE and LE, DTR + in all 4 extremities. -decreased sensation on left side PSYCHIATRIC: The patient is alert and oriented x 3. anxious SKIN: No obvious rash, lesion, or ulcer.    LABORATORY PANEL:   CBC  Recent Labs Lab 05/03/15 0042  WBC 13.9*  HGB 17.0  HCT 51.5  PLT 232   ------------------------------------------------------------------------------------------------------------------  Chemistries   Recent Labs Lab 05/03/15 0042  NA 140  K 3.9  CL 105  CO2 25  GLUCOSE 116*  BUN 12  CREATININE 0.89  CALCIUM 9.0  AST 57*  ALT 71*  ALKPHOS 71  BILITOT 0.6   ------------------------------------------------------------------------------------------------------------------  Cardiac Enzymes  Recent Labs Lab 05/03/15 0042  TROPONINI <0.03   ------------------------------------------------------------------------------------------------------------------  RADIOLOGY:  Ct Head Wo Contrast  05/03/2015   CLINICAL DATA:  Alcohol. Uncontrollable right hand movement and slurred speech. Left-sided numbness.  EXAM: CT HEAD WITHOUT CONTRAST  TECHNIQUE: Contiguous axial images were obtained from the base of the skull through the vertex without intravenous contrast.  COMPARISON:  12/02/2014  FINDINGS: Examination is limited due to motion artifact. No mass effect or midline shift. No abnormal extra-axial fluid collections. Gray-white matter junctions are distinct. Basal cisterns are not effaced. No evidence of acute intracranial hemorrhage. No depressed  skull fractures. Opacification of some of  the ethmoid air cells with mucosal thickening in the maxillary antra. Mastoid air cells are not opacified. Old nasal bone fractures.  IMPRESSION: No acute intracranial abnormalities. Probable inflammatory changes in the paranasal sinuses.   Electronically Signed   By: Lucienne Capers M.D.   On: 05/03/2015 00:41   Mr Jeri Cos WR Contrast  05/03/2015   CLINICAL DATA:  Left-sided weakness and numbness. Speech disturbance. Initial encounter. Symptoms began 17 hours ago.  EXAM: MRI HEAD WITHOUT AND WITH CONTRAST  TECHNIQUE: Multiplanar, multiecho pulse sequences of the brain and surrounding structures were obtained without and with intravenous contrast.  CONTRAST:  87mL MULTIHANCE GADOBENATE DIMEGLUMINE 529 MG/ML IV SOLN  COMPARISON:  CT head without contrast from the same day.  FINDINGS: The diffusion-weighted images demonstrate no evidence for acute for subacute infarction. No acute hemorrhage or mass lesion is present. The study is mildly degraded by patient motion. Mild periventricular and subcortical T2 changes are present. The ventricles are of normal size. No significant extraaxial fluid collection is present.  Flow is present in the major intracranial arteries. Mild mucosal thickening is present in the right maxillary sinus and bilateral ethmoid air cells. There is some mucosal thickening in the sphenoid sinuses. The mastoid air cells are clear. The skullbase is within normal limits. Midline structures are normal.  The postcontrast images demonstrate no pathologic enhancement.  IMPRESSION: 1. No acute intracranial abnormality. 2. Scattered periventricular and subcortical T2 hyperintensities are slightly greater than expected for age. The finding is nonspecific but can be seen in the setting of chronic microvascular ischemia, a demyelinating process such as multiple sclerosis, vasculitis, complicated migraine headaches, or as the sequelae of a prior infectious or  inflammatory process. 3. Mild mucosal thickening throughout the paranasal sinuses. No fluid levels are present.   Electronically Signed   By: San Morelle M.D.   On: 05/03/2015 09:16   US Carotid Bilateral  05/03/2015   CLINICAL DATA:  Left-sided numbness, possible cerebrovascular accident.  EXAM: BILATERAL CAROTID DUPLEX ULTRASOUND  TECHNIQUE: Pearline Cables scale imaging, color Doppler and duplex ultrasound were performed of bilateral carotid and vertebral arteries in the neck.  COMPARISON:  None.  FINDINGS: Criteria: Quantification of carotid stenosis is based on velocity parameters that correlate the residual internal carotid diameter with NASCET-based stenosis levels, using the diameter of the distal internal carotid lumen as the denominator for stenosis measurement.  The following velocity measurements were obtained:  RIGHT  ICA:  66/29 cm/sec  CCA:  60/45 cm/sec  SYSTOLIC ICA/CCA RATIO:  0.8  DIASTOLIC ICA/CCA RATIO:  1.1  ECA:  113 cm/sec  LEFT  ICA:  69/31 cm/sec  CCA:  40/98 cm/sec  SYSTOLIC ICA/CCA RATIO:  0.8  DIASTOLIC ICA/CCA RATIO:  1.3  ECA:  94 cm/sec  RIGHT CAROTID ARTERY: Mild plaque formation is noted in right carotid bulb and proximal right internal carotid artery consistent with less than 50% diameter stenosis based on ultrasound and Doppler criteria.  RIGHT VERTEBRAL ARTERY:  Antegrade flow is noted.  LEFT CAROTID ARTERY: Mild plaque formation is noted in the left carotid bulb and proximal left internal carotid artery consistent with less than 50% diameter stenosis based on ultrasound and Doppler criteria.  LEFT VERTEBRAL ARTERY:  Antegrade flow is noted.  Incidental note is made of 2 complex but predominantly cystic nodules in the right thyroid lobe.  IMPRESSION: No hemodynamically significant plaque or stenosis is noted in the cervical carotid arteries bilaterally.  Two complex but predominantly cystic nodules are noted in the right  thyroid lobe. Thyroid ultrasound is recommended for  further evaluation.   Electronically Signed   By: Marijo Conception, M.D.   On: 05/03/2015 10:06   Dg Chest Port 1 View  05/03/2015   CLINICAL DATA:  Altered mental status. Left-sided weakness. Smoker.  EXAM: PORTABLE CHEST - 1 VIEW  COMPARISON:  02/25/2015  FINDINGS: AP portable views of the chest demonstrate no focal airspace consolidation or alveolar edema. The lungs are grossly clear. There is no large effusion or pneumothorax. Cardiac and mediastinal contours appear unremarkable.  IMPRESSION: No active disease.   Electronically Signed   By: Andreas Newport M.D.   On: 05/03/2015 00:43     ASSESSMENT AND PLAN:   1. Left-sided weakness with speech disturbances, acute CVA RULED out with negative MRI and CT head -continue aspirin.  Spoke with Dr Jayme [facility] and recommends MRI cervical spine to r/o cervical myelopathy -pt has had a few falls in the last few weeks and his weakness is progressively getting worse. -echo noted  2. History of coronary artery disease status post angioplasty in the past. Patient stable no cardiac complaints at this time. 3.   History of depression/anxiety. Continue home medications. 4. History of Barrett's esophagitis. Stable, continue PPI. 5. History of prostate cancer status post seed implantation. Stable. Monitor. 6. ETOH use. Monitor for withdrawls. CIWA protocol. 7. Hypertension, stable on home medications, continue same. 8. PT to evaluate for gait/balance  Code Status: Full code DVT Prophylaxis: SubQ lovenox.     All the records are reviewed and case discussed with Care Management/Social Workerr. Management plans discussed with the patient, family and they are in agreement.  CODE STATUS: Full code  TOTAL TIME TAKING CARE OF THIS PATIENT: 35 minutes.   D/C DEPENDING ON CLINICAL CONDITION.   Xyon Lukasik M.D on 05/04/2015 at 10:09 AM  Between 7am to 6pm - Pager - [phone]  After 6pm go to [email or web address] - password [email or web address] [facility]  [facility]  Office  [phone]  CC: Primary care physician; No PCP Per Patient

## 2015-05-04 NOTE — Care Management (Addendum)
Order for CM assessment for home health needs.  Patient presents from home.  Says that he is not homeless.  Confirms his address is correct on the facesheet.  He denies that he has polysubstance abuse issues.  "I am tired of people talking about that."   Patient has had a recent beh med admission for etoh and did test positive for cocaine.  He denies having problems with housing food and shelter.  Says that medicaid called him and told him if they did not hear from him, they would change his PCP to [facility].  Patient says at present is [facility] and does not like the clinic.  Says he has been fighting the transportation people.  Said he completed the application and answered the questions over the phone and the form never got to medicaid to approve transportation.  He becomes agitated when he discusses this.  He is followed by a Alvester Chou at [facility] for Barretts Esophagus and "they found cancer cells.  Michela Pitcher that he was given three options of treatment: surgical removal of his esophagus, chemo, or scraping.  Chose the scraping and has been receiving follow up for the past 3 years.  He was getting monthly blood work, now it is every three months.  His sister Alani Lacivita- [phone].  Patient has rule out for CVA but has sx of left sided weakness.    There is a very high probability that patient would not be able to receive home health physical therapy due to medicaid as a payor and it is not documented he has a "new"  Neurological dx.  Physical therapy consult is pending

## 2015-05-04 NOTE — Progress Notes (Signed)
PHARMACIST - PHYSICIAN COMMUNICATION  CONCERNING: IV to Oral Route Change Policy  RECOMMENDATION: This patient is receiving thiamine by the intravenous route.  Based on criteria approved by the Pharmacy and Therapeutics Committee, the intravenous medication(s) is/are being converted to the equivalent oral dose form(s).   DESCRIPTION: These criteria include:  The patient is eating (either orally or via tube) and/or has been taking other orally administered medications for a least 24 hours  The patient has no evidence of active gastrointestinal bleeding or impaired GI absorption (gastrectomy, short bowel, patient on TNA or NPO).  If you have questions about this conversion, please contact the Pharmacy Department  []   [phone] )  [facility] [x]   [phone] )  [facility] []   [phone] )  [facility] []   [phone] )  [facility] []   [phone] )  [place], PharmD Clinical Pharmacist 05/04/2015

## 2015-05-04 NOTE — Evaluation (Signed)
Physical Therapy Evaluation Patient Details Name: Ian [name] MRN: [id number] DOB: July 19, 1961 Today's Date: 05/04/2015   History of Present Illness  54 yo RHD M presents to [facility] after playing golf and noting some L sided weakness. Pt reports to me that he has had gradual L sided weakness over the past 6-8 weeks that has worsened.  CT and MRI negative for CVA at this time.  Neurologist to treat as CVA but is also concerned for conversion disorder.  Clinical Impression  Pt presents with decreased functional mobility, decreased strength on L side, and decreased sensation of L side.  Pt shows inconsistency with functional assessment, not able to move leg when asked but able to move spontaneously.  Pt not able to ambulate at this time due to poor standing balance, impulsive body movements, and decreased awareness.  Pt would benefit from PT services to address objective findings.    Follow Up Recommendations SNF    Equipment Recommendations  Cane    Recommendations for Other Services       Precautions / Restrictions Precautions Precautions: Fall Restrictions Weight Bearing Restrictions: No      Mobility  Bed Mobility Overal bed mobility: Needs Assistance Bed Mobility: Supine to Sit     Supine to sit: Min assist     General bed mobility comments: able to roll towards L side; Min A to elevate trunk off bed and verbal cues for sequencing.  Transfers Overall transfer level: Needs assistance Equipment used: Straight cane Transfers: Sit to/from [facility] Sit to Stand: Mod assist Stand pivot transfers: Mod assist       General transfer comment: Exaggerated forward lean with sit<>stand transfer, throwing himself forward; holds static standing balance with SBA and R UE support.  Ambulation/Gait             General Gait Details: Unable; transfer from bed to chair only with Mod A, reaching with R UE for railing of chair.  Stairs             Wheelchair Mobility    Modified Rankin (Stroke Patients Only)       Balance Overall balance assessment: Needs assistance Sitting-balance support: Single extremity supported;Feet supported       Standing balance support: Single extremity supported                                 Pertinent Vitals/Pain Pain Assessment: No/denies pain    Home Living Family/patient expects to be discharged to:: Private residence Living Arrangements: Alone     Home Access: Ramped entrance     Home Layout: One level Home Equipment: None Additional Comments: sleeps on couch    Prior Function Level of Independence: Independent         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  He is able to perform complete hand flexion and extension but when asked to perform opposition he could not.  He holds in LUE in a flexed position when standing but does not presnt with any incraesed tone at rest.    Lower Extremity Assessment: Defer to PT evaluation   LLE Deficits / Details: Supine:  wiggles toes and slight contraction at hip/knee with heel slide  Communication   Communication: No difficulties  Cognition Arousal/Alertness: Awake/alert Behavior During Therapy: WFL for tasks assessed/performed Overall Cognitive Status: Within Functional Limits for tasks assessed                      General Comments General comments (skin integrity, edema, etc.): unkempt, dirty everywhere like he hasn't bathed in weeks or like he lives on the street.    Exercises        Assessment/Plan    PT Assessment Patient needs continued PT services  PT Diagnosis Difficulty walking;Hemiplegia non-dominant side   PT Problem List Decreased strength;Decreased balance;Decreased  mobility;Decreased coordination;Decreased knowledge of use of DME;Decreased safety awareness;Impaired sensation  PT Treatment Interventions DME instruction;Gait training;Functional mobility training;Therapeutic activities;Therapeutic exercise;Balance training;Neuromuscular re-education   PT Goals (Current goals can be found in the Care Plan section) Acute Rehab PT Goals Patient Stated Goal: To get stronger PT Goal Formulation: With patient Time For Goal Achievement: 05/18/15 Potential to Achieve Goals: Good    Frequency 7X/week   Barriers to discharge Decreased caregiver support      Co-evaluation   Reason for Co-Treatment: Complexity of the patient's impairments (multi-system involvement);For patient/therapist safety   OT goals addressed during session: ADL's and self-care       End of Session Equipment Utilized During Treatment: Gait belt Activity Tolerance: Patient tolerated treatment well Patient left: in chair;Other (comment) (with speech therapist)           Time: 1400-1430 PT Time Calculation (min) (ACUTE ONLY): 30 min   Charges:   PT Evaluation $Initial PT Evaluation Tier I: 1 Procedure     PT G Codes:        Ambra Haverstick A Florette Thai 06/01/2015, 3:10 PM

## 2015-05-04 NOTE — Consult Note (Addendum)
Reason for Consult: L sided weakness Referring Physician: Dr. Stark Falls is an 54 y.o. male.  HPI: seen at request of Dr. Allena Katz;  54 yo RHD M presents to [facility] after playing golf and noting some L sided weakness.  Pt reports to me that he has had gradual L sided weakness over the past 6-8 weeks that has worsened.  He reports some pins and needles in his L hand mainly.  He has only mild neck pain but complains a lot of diffuse back pain.  He reports that he was playing his usual Wednesday golf match when he noted to have more pins and needles in his L hand then developed some nausea with vomiting.  After that he noted that he was unable to move his L body.  He denies headache.  He has never had an episode like this before either.  He feels like his L sided strength is a little better today but he is still unable to use this L side.  Past Medical History  Diagnosis Date  . Cancer   . Hypertension   . Depression   . Anxiety   . Coronary artery disease   . GERD (gastroesophageal reflux disease)     Past Surgical History  Procedure Laterality Date  . Appendectomy    . Cholecystectomy    . Angioplasty    . Cardiac catheterization      Family History  Problem Relation Age of Onset  . Stroke Father   . Leukemia Father   . Breast cancer Sister     Social History:  reports that he has been smoking.  He does not have any smokeless tobacco history on file. He reports that he drinks alcohol. He reports that he uses illicit drugs (Cocaine and Marijuana).  Allergies:  Allergies  Allergen Reactions  . Fluoxetine Swelling    Anxiety, itching  . Hydrocodone-Acetaminophen Itching    Medications:  Anti-infectives    None     Medications reviewed personally by me and are consistent with chart  Results for orders placed or performed during the hospital encounter of 05/02/15 (from the past 48 hour(s))  CBC with Differential     Status: Abnormal   Collection Time: 05/03/15 12:42  AM  Result Value Ref Range   WBC 13.9 (H) 3.8 - 10.6 K/uL   RBC 5.75 4.40 - 5.90 MIL/uL   Hemoglobin 17.0 13.0 - 18.0 g/dL   HCT 98.8 71.5 - 50.9 %   MCV 89.5 80.0 - 100.0 fL   MCH 29.6 26.0 - 34.0 pg   MCHC 33.1 32.0 - 36.0 g/dL   RDW 57.3 61.1 - 60.8 %   Platelets 232 150 - 440 K/uL   Neutrophils Relative % 59 %   Neutro Abs 8.2 (H) 1.4 - 6.5 K/uL   Lymphocytes Relative 32 %   Lymphs Abs 4.5 (H) 1.0 - 3.6 K/uL   Monocytes Relative 6 %   Monocytes Absolute 0.8 0.2 - 1.0 K/uL   Eosinophils Relative 2 %   Eosinophils Absolute 0.3 0 - 0.7 K/uL   Basophils Relative 1 %   Basophils Absolute 0.1 0 - 0.1 K/uL  Comprehensive metabolic panel     Status: Abnormal   Collection Time: 05/03/15 12:42 AM  Result Value Ref Range   Sodium 140 135 - 145 mmol/L   Potassium 3.9 3.5 - 5.1 mmol/L   Chloride 105 101 - 111 mmol/L   CO2 25 22 - 32 mmol/L  Glucose, Bld 116 (H) 65 - 99 mg/dL   BUN 12 6 - 20 mg/dL   Creatinine, Ser 0.89 0.61 - 1.24 mg/dL   Calcium 9.0 8.9 - 10.3 mg/dL   Total Protein 7.4 6.5 - 8.1 g/dL   Albumin 4.3 3.5 - 5.0 g/dL   AST 57 (H) 15 - 41 U/L   ALT 71 (H) 17 - 63 U/L   Alkaline Phosphatase 71 38 - 126 U/L   Total Bilirubin 0.6 0.3 - 1.2 mg/dL   GFR calc non Af Amer >60 >60 mL/min   GFR calc Af Amer >60 >60 mL/min    Comment: (NOTE) The eGFR has been calculated using the CKD EPI equation. This calculation has not been validated in all clinical situations. eGFR's persistently <60 mL/min signify possible Chronic Kidney Disease.    Anion gap 10 5 - 15  Lipase, blood     Status: None   Collection Time: 05/03/15 12:42 AM  Result Value Ref Range   Lipase 28 22 - 51 U/L  Ethanol     Status: Abnormal   Collection Time: 05/03/15 12:42 AM  Result Value Ref Range   Alcohol, Ethyl (B) 210 (H) <5 mg/dL    Comment:        LOWEST DETECTABLE LIMIT FOR SERUM ALCOHOL IS 11 mg/dL FOR MEDICAL PURPOSES ONLY   Troponin I     Status: None   Collection Time: 05/03/15 12:42 AM   Result Value Ref Range   Troponin I <0.03 <0.031 ng/mL    Comment:        NO INDICATION OF MYOCARDIAL INJURY.   Urine Drug Screen, Qualitative [facility])     Status: Abnormal   Collection Time: 05/03/15 12:42 AM  Result Value Ref Range   Tricyclic, Ur Screen NONE DETECTED NONE DETECTED   Amphetamines, Ur Screen NONE DETECTED NONE DETECTED   MDMA (Ecstasy)Ur Screen NONE DETECTED NONE DETECTED   Cocaine Metabolite,Ur [place] POSITIVE (A) NONE DETECTED   Opiate, Ur Screen NONE DETECTED NONE DETECTED   Phencyclidine (PCP) Ur S NONE DETECTED NONE DETECTED   Cannabinoid 50 Ng, Ur [place] NONE DETECTED NONE DETECTED   Barbiturates, Ur Screen NONE DETECTED NONE DETECTED   Benzodiazepine, Ur Scrn NONE DETECTED NONE DETECTED   Methadone Scn, Ur NONE DETECTED NONE DETECTED    Comment: (NOTE) 856  Tricyclics, urine               Cutoff 1000 ng/mL 200  Amphetamines, urine             Cutoff 1000 ng/mL 300  MDMA (Ecstasy), urine           Cutoff 500 ng/mL 400  Cocaine Metabolite, urine       Cutoff 300 ng/mL 500  Opiate, urine                   Cutoff 300 ng/mL 600  Phencyclidine (PCP), urine      Cutoff 25 ng/mL 700  Cannabinoid, urine              Cutoff 50 ng/mL 800  Barbiturates, urine             Cutoff 200 ng/mL 900  Benzodiazepine, urine           Cutoff 200 ng/mL 1000 Methadone, urine                Cutoff 300 ng/mL 1100 1200 The urine drug screen provides only a preliminary, unconfirmed 1300 analytical test  result and should not be used for non-medical 1400 purposes. Clinical consideration and professional judgment should 1500 be applied to any positive drug screen result due to possible 1600 interfering substances. A more specific alternate chemical method 1700 must be used in order to obtain a confirmed analytical result.  1800 Gas chromato graphy / mass spectrometry (GC/MS) is the preferred 1900 confirmatory method.   Folate     Status: None   Collection Time: 05/03/15  9:16 PM  Result  Value Ref Range   Folate 17.5 >5.9 ng/mL  TSH     Status: None   Collection Time: 05/03/15  9:16 PM  Result Value Ref Range   TSH 2.244 0.350 - 4.500 uIU/mL  Sedimentation rate     Status: None   Collection Time: 05/03/15  9:16 PM  Result Value Ref Range   Sed Rate 1 0 - 20 mm/hr    Ct Head Wo Contrast  05/03/2015   CLINICAL DATA:  Alcohol. Uncontrollable right hand movement and slurred speech. Left-sided numbness.  EXAM: CT HEAD WITHOUT CONTRAST  TECHNIQUE: Contiguous axial images were obtained from the base of the skull through the vertex without intravenous contrast.  COMPARISON:  12/02/2014  FINDINGS: Examination is limited due to motion artifact. No mass effect or midline shift. No abnormal extra-axial fluid collections. Gray-white matter junctions are distinct. Basal cisterns are not effaced. No evidence of acute intracranial hemorrhage. No depressed skull fractures. Opacification of some of the ethmoid air cells with mucosal thickening in the maxillary antra. Mastoid air cells are not opacified. Old nasal bone fractures.  IMPRESSION: No acute intracranial abnormalities. Probable inflammatory changes in the paranasal sinuses.   Electronically Signed   By: Lucienne Capers M.D.   On: 05/03/2015 00:41   Mr Jeri Cos ZR Contrast  05/03/2015   CLINICAL DATA:  Left-sided weakness and numbness. Speech disturbance. Initial encounter. Symptoms began 17 hours ago.  EXAM: MRI HEAD WITHOUT AND WITH CONTRAST  TECHNIQUE: Multiplanar, multiecho pulse sequences of the brain and surrounding structures were obtained without and with intravenous contrast.  CONTRAST:  30mL MULTIHANCE GADOBENATE DIMEGLUMINE 529 MG/ML IV SOLN  COMPARISON:  CT head without contrast from the same day.  FINDINGS: The diffusion-weighted images demonstrate no evidence for acute for subacute infarction. No acute hemorrhage or mass lesion is present. The study is mildly degraded by patient motion. Mild periventricular and subcortical T2  changes are present. The ventricles are of normal size. No significant extraaxial fluid collection is present.  Flow is present in the major intracranial arteries. Mild mucosal thickening is present in the right maxillary sinus and bilateral ethmoid air cells. There is some mucosal thickening in the sphenoid sinuses. The mastoid air cells are clear. The skullbase is within normal limits. Midline structures are normal.  The postcontrast images demonstrate no pathologic enhancement.  IMPRESSION: 1. No acute intracranial abnormality. 2. Scattered periventricular and subcortical T2 hyperintensities are slightly greater than expected for age. The finding is nonspecific but can be seen in the setting of chronic microvascular ischemia, a demyelinating process such as multiple sclerosis, vasculitis, complicated migraine headaches, or as the sequelae of a prior infectious or inflammatory process. 3. Mild mucosal thickening throughout the paranasal sinuses. No fluid levels are present.   Electronically Signed   By: San Morelle M.D.   On: 05/03/2015 09:16   US Carotid Bilateral  05/03/2015   CLINICAL DATA:  Left-sided numbness, possible cerebrovascular accident.  EXAM: BILATERAL CAROTID DUPLEX ULTRASOUND  TECHNIQUE: Pearline Cables scale imaging, color  Doppler and duplex ultrasound were performed of bilateral carotid and vertebral arteries in the neck.  COMPARISON:  None.  FINDINGS: Criteria: Quantification of carotid stenosis is based on velocity parameters that correlate the residual internal carotid diameter with NASCET-based stenosis levels, using the diameter of the distal internal carotid lumen as the denominator for stenosis measurement.  The following velocity measurements were obtained:  RIGHT  ICA:  66/29 cm/sec  CCA:  27/51 cm/sec  SYSTOLIC ICA/CCA RATIO:  0.8  DIASTOLIC ICA/CCA RATIO:  1.1  ECA:  113 cm/sec  LEFT  ICA:  69/31 cm/sec  CCA:  70/01 cm/sec  SYSTOLIC ICA/CCA RATIO:  0.8  DIASTOLIC ICA/CCA RATIO:  1.3   ECA:  94 cm/sec  RIGHT CAROTID ARTERY: Mild plaque formation is noted in right carotid bulb and proximal right internal carotid artery consistent with less than 50% diameter stenosis based on ultrasound and Doppler criteria.  RIGHT VERTEBRAL ARTERY:  Antegrade flow is noted.  LEFT CAROTID ARTERY: Mild plaque formation is noted in the left carotid bulb and proximal left internal carotid artery consistent with less than 50% diameter stenosis based on ultrasound and Doppler criteria.  LEFT VERTEBRAL ARTERY:  Antegrade flow is noted.  Incidental note is made of 2 complex but predominantly cystic nodules in the right thyroid lobe.  IMPRESSION: No hemodynamically significant plaque or stenosis is noted in the cervical carotid arteries bilaterally.  Two complex but predominantly cystic nodules are noted in the right thyroid lobe. Thyroid ultrasound is recommended for further evaluation.   Electronically Signed   By: Marijo Conception, M.D.   On: 05/03/2015 10:06   Dg Chest Port 1 View  05/03/2015   CLINICAL DATA:  Altered mental status. Left-sided weakness. Smoker.  EXAM: PORTABLE CHEST - 1 VIEW  COMPARISON:  02/25/2015  FINDINGS: AP portable views of the chest demonstrate no focal airspace consolidation or alveolar edema. The lungs are grossly clear. There is no large effusion or pneumothorax. Cardiac and mediastinal contours appear unremarkable.  IMPRESSION: No active disease.   Electronically Signed   By: Andreas Newport M.D.   On: 05/03/2015 00:43    Review of Systems  Constitutional: Positive for weight loss and malaise/fatigue. Negative for fever, chills and diaphoresis.  Eyes: Negative.   Respiratory: Negative.   Cardiovascular: Negative.   Gastrointestinal: Negative.   Genitourinary: Negative.   Musculoskeletal: Positive for back pain, falls and neck pain. Negative for myalgias and joint pain.  Skin: Negative.   Neurological: Positive for sensory change, focal weakness and weakness. Negative for  dizziness, tingling, tremors, speech change, seizures and loss of consciousness.  Endo/Heme/Allergies: Negative.   Psychiatric/Behavioral: Positive for substance abuse. Negative for depression, suicidal ideas and hallucinations.   Blood pressure 138/86, pulse 73, temperature 97.5 F (36.4 C), temperature source Oral, resp. rate 20, height 6' (1.829 m), weight 114.216 kg (251 lb 12.8 oz), SpO2 95 %. Physical Exam  Constitutional: He is oriented to person, place, and time. He appears well-developed and well-nourished.  Very dirty and poorly kept  HENT:  Head: Normocephalic and atraumatic.  Mouth/Throat: Oropharynx is clear and moist.  Eyes: Conjunctivae and EOM are normal. Pupils are equal, round, and reactive to light.  Neck: Normal range of motion. Neck supple.  Cardiovascular: Normal rate, regular rhythm and normal heart sounds.   Respiratory: Effort normal and breath sounds normal.  GI: Soft. Bowel sounds are normal.  Neurological: He is alert and oriented to person, place, and time. He has normal reflexes. A sensory deficit  is present. No cranial nerve deficit. He exhibits normal muscle tone.  4+/5 R strength, 2+/5 L UE and 1+/5 L LE but there is marked giveaway weakness on L;  + Hoover, neg Hoffmans, sensation changes on L to temp and questionable vibration;  No neglect  Skin: Skin is warm and dry.  Dirt everywhere   MRI of brain was personally reviewed by me and shows a couple of very small white matter changes  Labs reviewed by me as above  Case d/w Dr. Posey Pronto  Assessment/Plan: 1.  L sided weakness-  This is a difficult and unclear case;  MRI of brain is normal but abrupt onset of increased weakness is more like a stroke and pt has multiple stroke risk factors including cocaine usage.  Pt does have some gradual weakness on that side as well which can be seen in cervical myelopathy as well.  Suspect poor nutrition by pts demeanor so will r/o vitamin deficiencies as well.  There is  some anxiety and poor effort on exam which also makes [place] concerned for conversion d/o.   2.  Poly substance abuse-  Pt states that he does not drink much -  MRI of c-spine w/wo contrast pending -  Check CPK, B12/folate, TSH, ESR -  Start decadron 64m q6h until MRI returns -  Start thiamine 1451QUIV and folic acid 138mIV daily -  Will follow results  Joanie Duprey 05/04/2015, 10:48 AM    MRI of c-spine personally reviewed by me and is normal.  Would still treat like MRI- stroke.  D/c steroids.  Continue ASA 8130maily;  Needs better BP control with goal < 130/80.  Check LDL and add statin to keep < 70.  Labs pending and will follow labs.  Could even repeat MRI but dont see much benefit.  D/c home per PT recs and [facility] and EMG/NCS on the L side as outpatient.

## 2015-05-05 ENCOUNTER — Inpatient Hospital Stay: Payer: Medicaid Other

## 2015-05-05 LAB — MYOGLOBIN, SERUM: MYOGLOBIN: 29 ng/mL (ref 28–72)

## 2015-05-05 IMAGING — CT CT HEAD WITHOUT CONTRAST
4 series · 18 of 30 positions shown, 19 images · non-contrast
Comparison: CT of the head February 04, 2014

CLINICAL DATA: Fall, arm numbness.

EXAM:
CT HEAD WITHOUT CONTRAST
TECHNIQUE: Contiguous axial images were obtained from the base of the skull
through the vertex without intravenous contrast.

[Series 2: head bone · axial · 0.43mm/px · z∈[+1066,+1204]mm · 8 of 85 slices shown]
[im 8/85  bone]
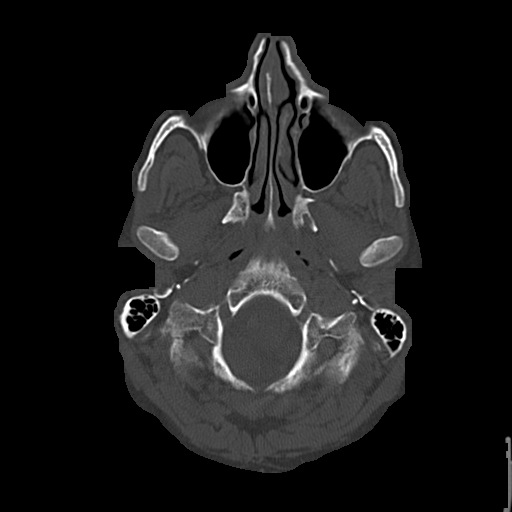
[im 16/85  bone]
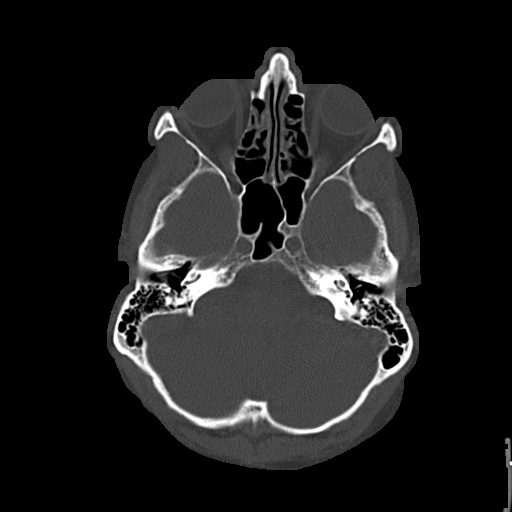
[im 31/85  bone]
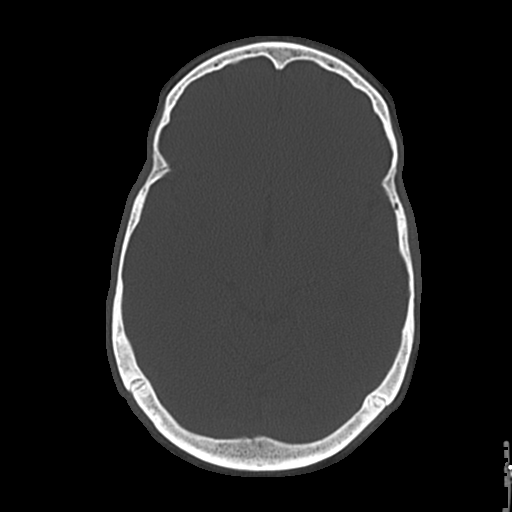
[im 39/85  bone]
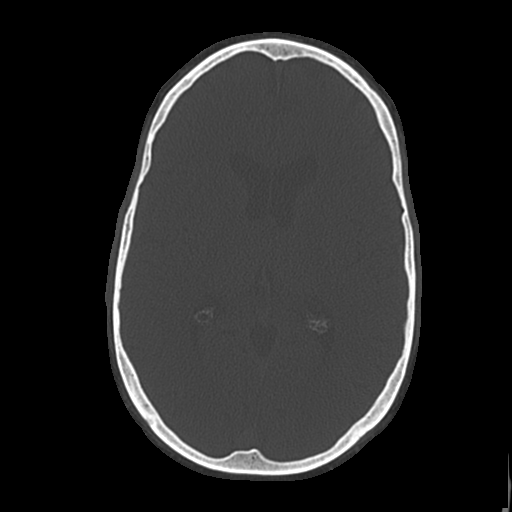
[im 46/85  bone]
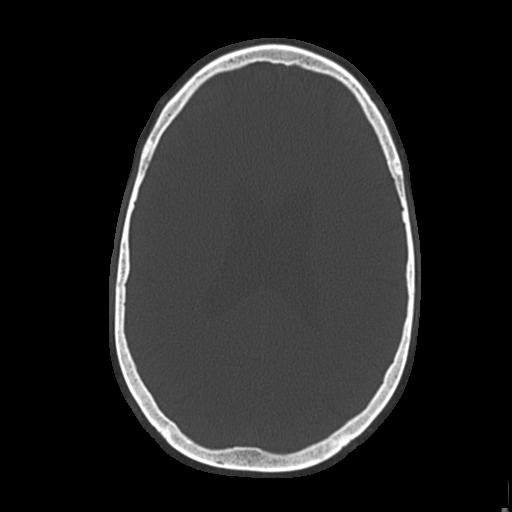
[im 54/85  bone]
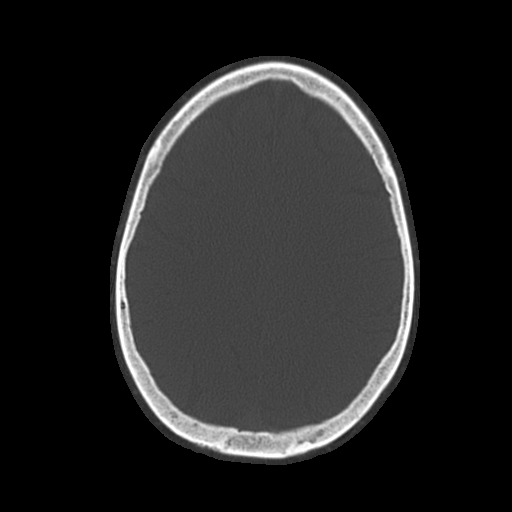
[im 69/85  bone]
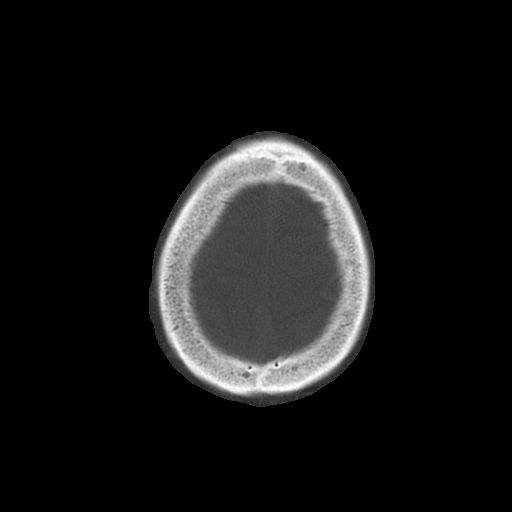
[im 77/85  bone]
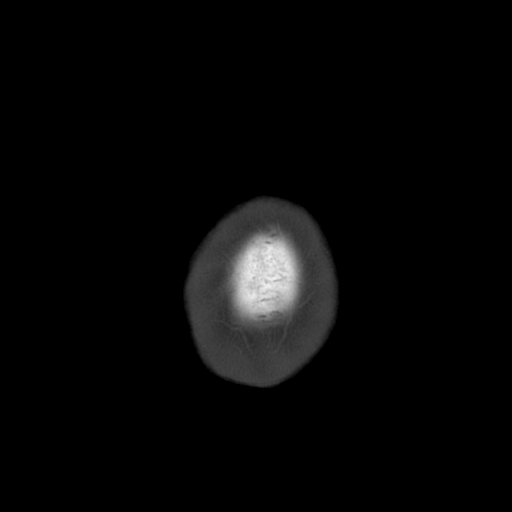

[Series 3: head wo · axial · 0.43mm/px · z∈[+1112,+1162]mm · 2 of 31 slices shown, 3 images]
[im 11/31  brain]
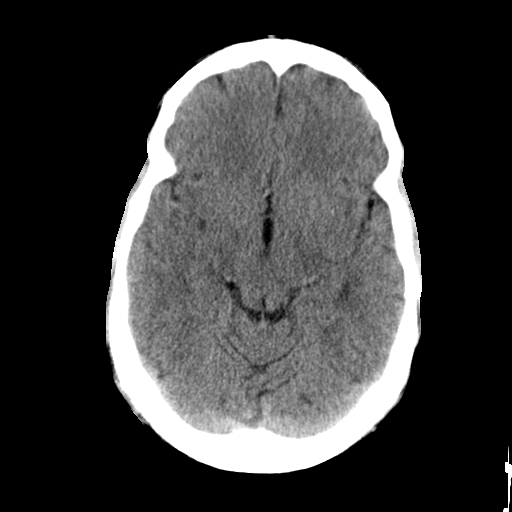
[im 11/31  bone]
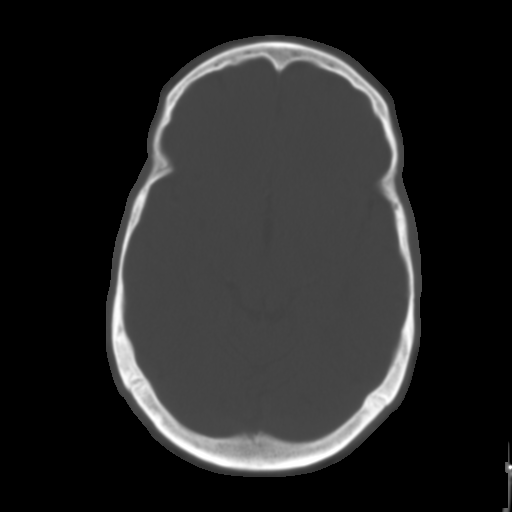
[im 21/31  brain]
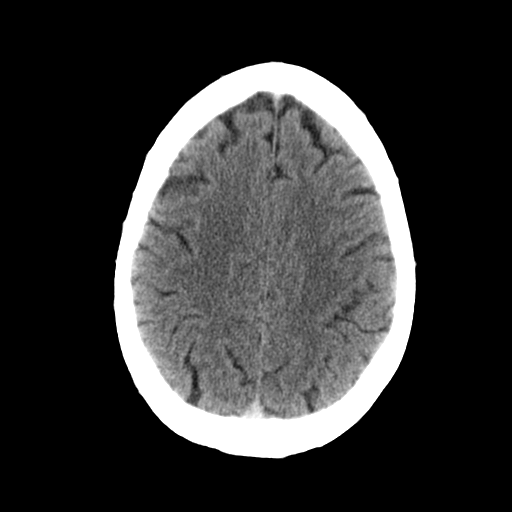

[Series 4: head wo recons · axial · 0.43mm/px · z∈[+1151,+1194]mm · 2 of 29 slices shown]
[im 10/29  brain]
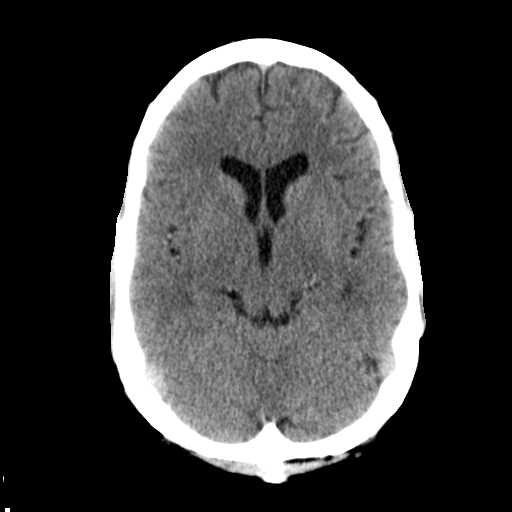
[im 19/29  brain]
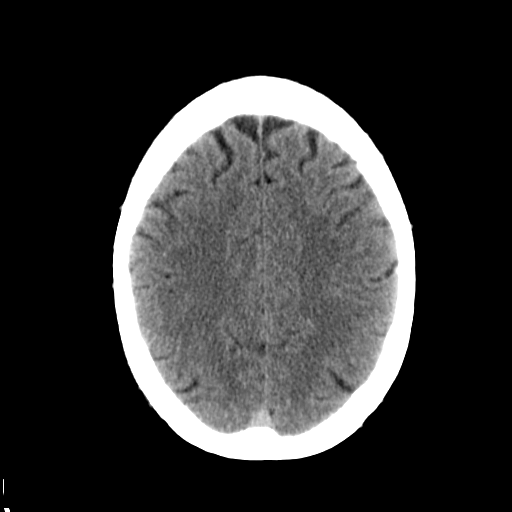

[Series 5: head bone recons · axial · 0.43mm/px · z∈[+1136,+1211]mm · 6 of 74 slices shown]
[im 9/74  bone]
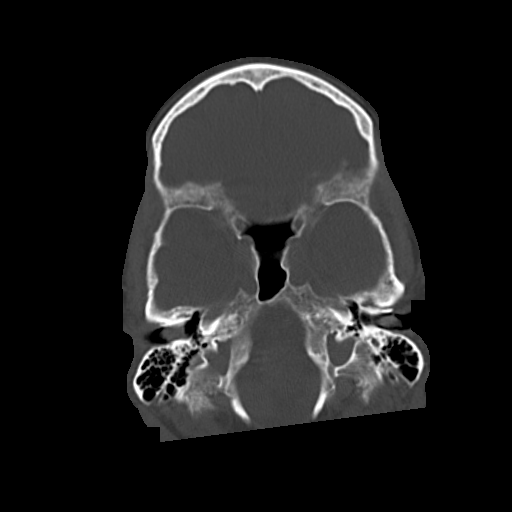
[im 17/74  bone]
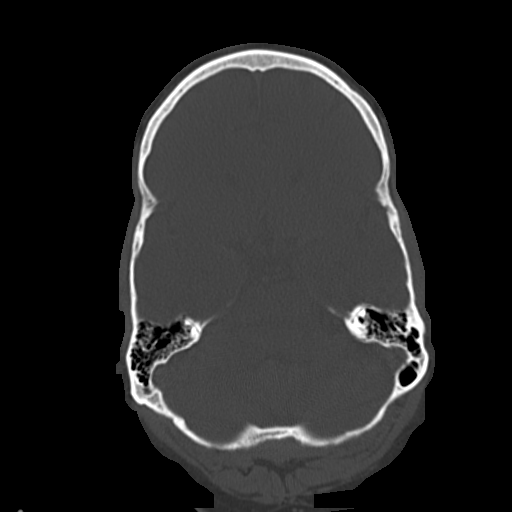
[im 25/74  bone]
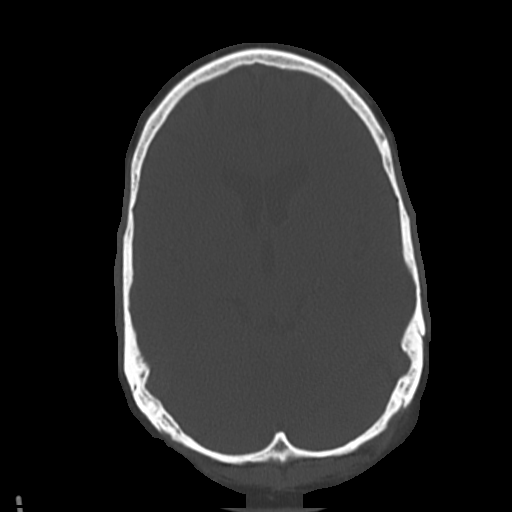
[im 33/74  bone]
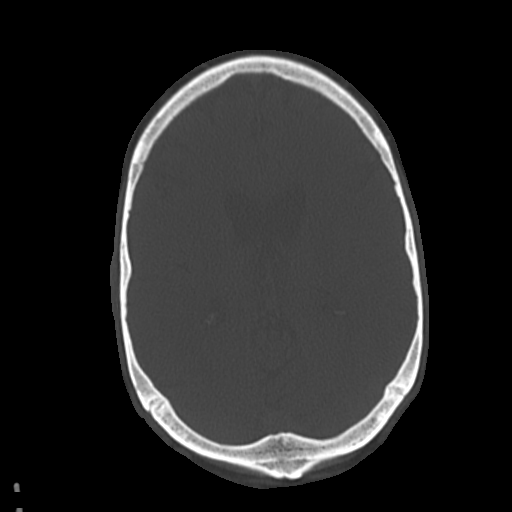
[im 41/74  bone]
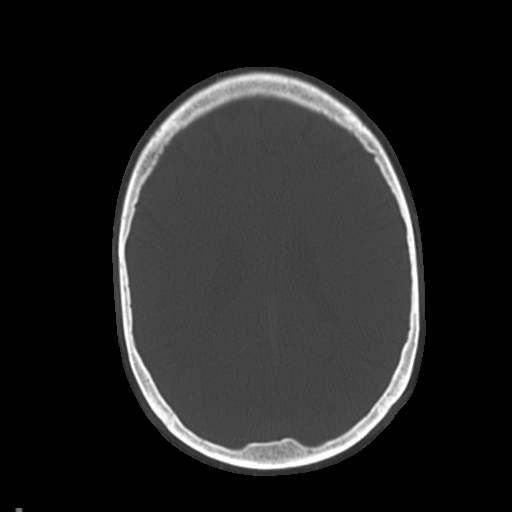
[im 49/74  bone]
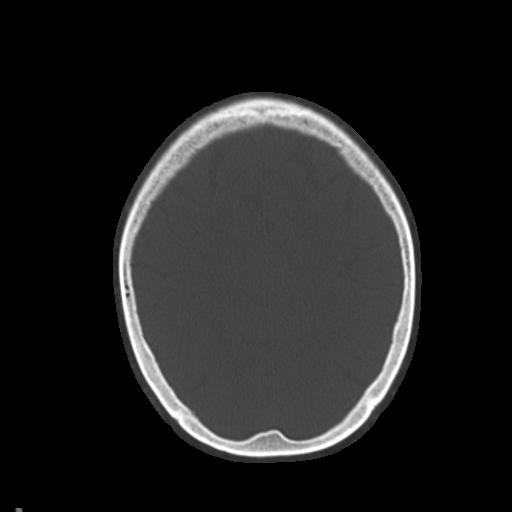

[18 of 30 positions shown; findings below may reference images not displayed]

FINDINGS: The ventricles and sulci are normal. No intraparenchymal hemorrhage,
mass effect nor midline shift. No acute large vascular territory
infarcts.

No abnormal extra-axial fluid collections. Basal cisterns are
patent. Mildly dense intracranial vessels likely reflect
hemoconcentration.

No skull fracture. The included ocular globes and orbital contents
are non-suspicious. Right maxillary mucosal retention cyst without
paranasal sinus air-fluid levels. Anteriorly subluxed mandible
condyles suggests malocclusion on the localizer. Remote nasal bone
fractures. Soft tissue within the bilateral external auditory canals
may reflect cerumen.
IMPRESSION: No acute intracranial process.

Mildly dense intracranial vessels may reflect hemoconcentration.

  By: Auntyjatty Delowr

## 2015-05-05 IMAGING — CR DG CHEST 1V PORT
1 series · 1 of 1 positions shown · non-contrast
Comparison: Chest radiograph performed 02/04/2014

CLINICAL DATA: Chest pain and left arm numbness for 2 days.

EXAM:
PORTABLE CHEST - 1 VIEW

[x chest ap]
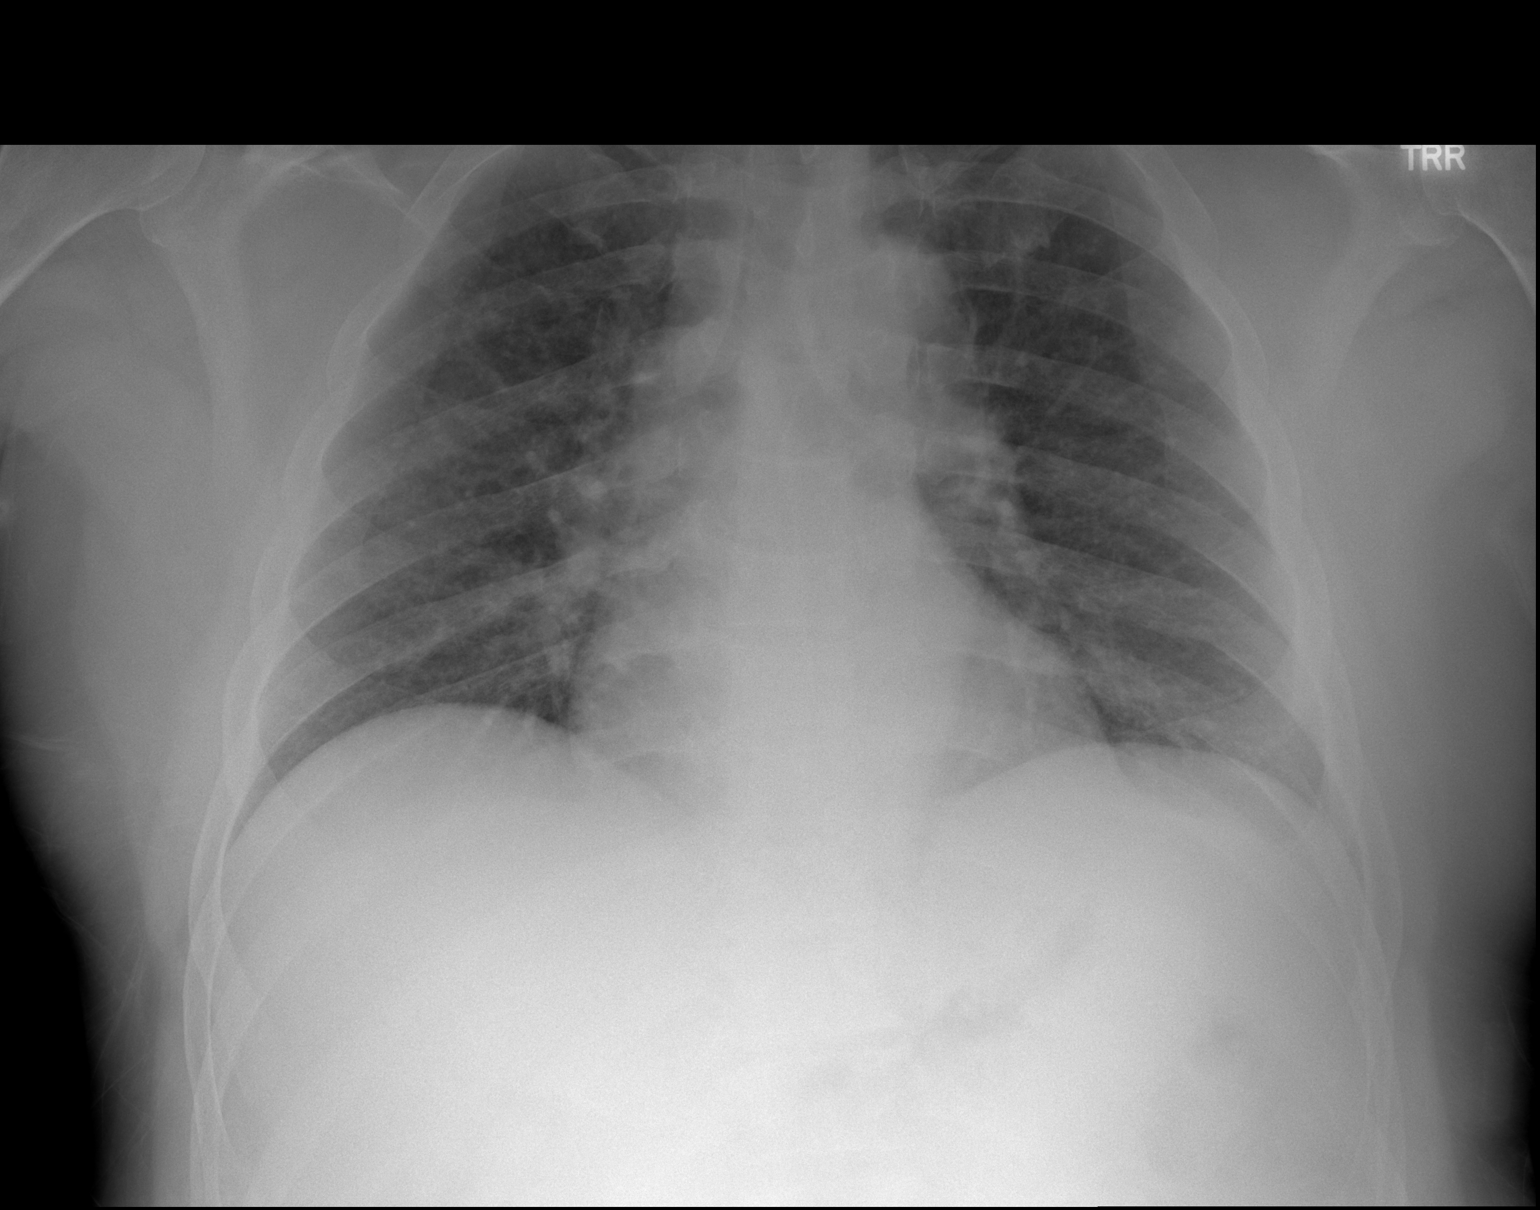

[1 of 1 positions shown; findings below may reference images not displayed]

FINDINGS: The lungs are well-aerated. Mild bilateral opacities may reflect
atelectasis or minimal interstitial edema. No pleural effusion or
pneumothorax is seen.

The cardiomediastinal silhouette is borderline normal in size. No
acute osseous abnormalities are seen.
IMPRESSION: Mild bilateral airspace opacities may reflect atelectasis or minimal
interstitial edema.

## 2015-05-05 MED ORDER — CYANOCOBALAMIN 1000 MCG/ML IJ SOLN
1000.0000 ug | Freq: Every day | INTRAMUSCULAR | Status: DC
  Administered 2015-05-05 – 2015-05-06 (×2): 1000 ug via INTRAMUSCULAR
  Filled 2015-05-05 (×3): qty 1

## 2015-05-05 NOTE — Progress Notes (Signed)
Chart reviewed.  MRI of c-spine personally reviewed by me and completely normal.  Improved weakness today.  B12 extremely low at 172 which correlates with deficiency and could cause patients weakness.  Still fluctuating exam that is unclear.  Pt will multiple stroke risk factors so still chance that he could have MRI negative stroke.  Still complains of low back pain.  Replace B12 1059mcg SQ daily x 7 days, check methylmalonic and homocysteine levels to confirm B12 defiency.  MRI of l-spine and repeat brain pending.  Will likely go to rehab next week.  Will follow

## 2015-05-05 NOTE — Progress Notes (Signed)
Physical Therapy Treatment Patient Details Name: Ian [name] MRN: [id number] DOB: 25-Oct-1961 Today's Date: 05/05/2015    History of Present Illness 54 yo RHD M presents to [facility] after playing golf and noting some L sided weakness. Pt reports to me that he has had gradual L sided weakness over the past 6-8 weeks that has worsened.  CT and MRI negative for CVA at this time.  Neurologist to treat as CVA but is also concerned for conversion disorder.    PT Comments    Patient's L hand grip improved today sufficiently to use rolling walker. Ambulated 5 ft with walker and mod asst. Patient has a great deal of difficulty advancing the LLE during gait and requires close supervision. Bed mobility and transfers improved to min asst with instructions in moving sit-to-supine through sidelying and using trunk flexion without momentum .  Follow Up Recommendations  SNF     Equipment Recommendations  Rolling walker with 5" wheels    Recommendations for Other Services       Precautions / Restrictions Precautions Precautions: Fall Restrictions Weight Bearing Restrictions: No    Mobility  Bed Mobility Overal bed mobility: Needs Assistance Bed Mobility: Rolling;Sit to Supine;Supine to Sit Rolling: Min guard (with bedrail)   Supine to sit: Min assist (HOB partially elevated; initially max but improved to min) Sit to supine: Min assist   General bed mobility comments: Patient instructed to move supine-to-sit through sidelying  Transfers Overall transfer level: Needs assistance Equipment used: Rolling walker (2 wheeled) Transfers: Sit to/from Stand Sit to Stand: Mod assist Stand pivot transfers: Mod assist       General transfer comment: Emphasized forward trunk flexion without momentum; patient able to stand, holding to therapist's elbow. Shaky, wide BOS.  Ambulation/Gait Ambulation/Gait assistance: Mod assist Ambulation Distance (Feet): 5 Feet Assistive device: Rolling walker (2  wheeled) Gait Pattern/deviations: Step-to pattern;Decreased step length - right;Decreased step length - left;Decreased stance time - left;Decreased weight shift to left     General Gait Details: Patient has much difficulty advancing LLE; L grip has improved sufficiently to grip walker handle.   Stairs            Wheelchair Mobility    Modified Rankin (Stroke Patients Only)       Balance Overall balance assessment: Needs assistance Sitting-balance support: Single extremity supported;Feet supported         Standing balance-Leahy Scale: Fair   Single Leg Stance - Right Leg:  (unable to attempt)             High Level Balance Comments: Patient maintains wide BOS. Able to maintain balance without support but fearful. Able to rotate trunk slightly to right and left without LOB.    Cognition Arousal/Alertness: Awake/alert Behavior During Therapy: WFL for tasks assessed/performed Overall Cognitive Status: Within Functional Limits for tasks assessed                      Exercises      General Comments        Pertinent Vitals/Pain Pain Assessment: 0-10 Pain Score:  (8.5) Pain Location: back pain, L buttock to L shoulder blade Pain Descriptors / Indicators: Stabbing Pain Intervention(s): Limited activity within patient's tolerance;Monitored during session    Home Living                      Prior Function            PT Goals (current goals can  now be found in the care plan section) Acute Rehab PT Goals Patient Stated Goal: to walk PT Goal Formulation: With patient Potential to Achieve Goals: Good Progress towards PT goals: Progressing toward goals    Frequency  7X/week    PT Plan Current plan remains appropriate    Co-evaluation             End of Session Equipment Utilized During Treatment: Gait belt Activity Tolerance: Patient tolerated treatment well;Patient limited by fatigue (HR to 114bpm with standing activity, O2 sats  96%) Patient left: in bed;with bed alarm set;with call bell/phone within reach     Time: 5789-7847 PT Time Calculation (min) (ACUTE ONLY): 30 min  Charges:  $Therapeutic Activity: 8-22 mins $Neuromuscular Re-education: 8-22 mins                    G Codes:     Violet Baldy, PT, [place] 05/05/2015, 2:05 PM

## 2015-05-05 NOTE — Progress Notes (Signed)
Patient ID: Ian [name], male   DOB: [birth year], 54 y.o.   MRN: [id number] [place] [facility] [place] NAME: Ian [name]    MR#:  [id number]  DATE OF BIRTH:  02-Oct-1961   PRIMARY CARE; NONE. MEDICAID CARD HAS appt with [facility]  SUBJECTIVE:  Came in with Left upper and LE weakness acute on chronic for few weeks. -left UE weakness much better. Pt shaking hand to raise it up but when helped he is able to tod it -left LE now able to move hsi left leg little better  REVIEW OF SYSTEMS:   Review of Systems  Constitutional: Negative for fever, chills and weight loss.  HENT: Negative for ear discharge, ear pain and nosebleeds.   Eyes: Negative for blurred vision, pain and discharge.  Respiratory: Negative for sputum production, shortness of breath, wheezing and stridor.   Cardiovascular: Negative for chest pain, palpitations, orthopnea and PND.  Gastrointestinal: Negative for nausea, vomiting, abdominal pain and diarrhea.  Genitourinary: Negative for urgency and frequency.  Musculoskeletal: Negative for back pain and joint pain.  Neurological: Positive for focal weakness and weakness. Negative for sensory change and speech change.  Psychiatric/Behavioral: Negative for depression and hallucinations. The patient is nervous/anxious.      DRUG ALLERGIES:   Allergies  Allergen Reactions  . Fluoxetine Swelling    Anxiety, itching  . Hydrocodone-Acetaminophen Itching    VITALS:  Blood pressure 140/90, pulse 87, temperature 97.5 F (36.4 C), temperature source Oral, resp. rate 22, height 6' (1.829 m), weight 114.306 kg (252 lb), SpO2 94 %.  PHYSICAL EXAMINATION:  GENERAL:  54 y.o.-year-old patient lying in the bed with no acute distress.  EYES: Pupils equal, round, reactive to light and accommodation. No scleral icterus. Extraocular muscles intact.  HEENT: Head atraumatic, normocephalic. Oropharynx and nasopharynx clear.  NECK:   Supple, no jugular venous distention. No thyroid enlargement, no tenderness.  LUNGS: Normal breath sounds bilaterally, no wheezing, rales,rhonchi or crepitation. No use of accessory muscles of respiration.  CARDIOVASCULAR: S1, S2 normal. No murmurs, rubs, or gallops.  ABDOMEN: Soft, nontender, nondistended. Bowel sounds present. No organomegaly or mass.  EXTREMITIES: No pedal edema, cyanosis, or clubbing.  NEUROLOGIC: Cranial nerves II through XII are intact. -improving weakness on the left UE and LE, DTR + in all 4 extremities. -decreased sensation on left side PSYCHIATRIC: The patient is alert and oriented x 3. anxious SKIN: No obvious rash, lesion, or ulcer.    LABORATORY PANEL:   CBC  Recent Labs Lab 05/03/15 0042  WBC 13.9*  HGB 17.0  HCT 51.5  PLT 232   ------------------------------------------------------------------------------------------------------------------  Chemistries   Recent Labs Lab 05/03/15 0042  NA 140  K 3.9  CL 105  CO2 25  GLUCOSE 116*  BUN 12  CREATININE 0.89  CALCIUM 9.0  AST 57*  ALT 71*  ALKPHOS 71  BILITOT 0.6   ------------------------------------------------------------------------------------------------------------------  Cardiac Enzymes  Recent Labs Lab 05/03/15 0042  TROPONINI <0.03   ------------------------------------------------------------------------------------------------------------------  RADIOLOGY:  Mr Cervical Spine W Wo Contrast  05/04/2015   CLINICAL DATA:  Acute left-sided muscle weakness.  EXAM: MRI CERVICAL SPINE WITHOUT AND WITH CONTRAST  TECHNIQUE: Multiplanar and multiecho pulse sequences of the cervical spine, to include the craniocervical junction and cervicothoracic junction, were obtained according to standard protocol without and with intravenous contrast.  CONTRAST:  79mL MULTIHANCE GADOBENATE DIMEGLUMINE 529 MG/ML IV SOLN  COMPARISON:  12/02/2014  FINDINGS: No marrow signal abnormality suggestive  of fracture, infection, or neoplasm. Normal cord signal and morphology. No extra-spinal findings to explain pain. The visible vertebral arteries have normal flow related signal loss. No abnormal enhancement.  Degenerative changes:  C2-3: Unremarkable.  C3-4: Unremarkable.  C4-5: Unremarkable.  C5-6: Mild bulging of the annulus.  No impingement or herniation.  C6-7: Mild bulging of the annulus.  No impingement or herniation.  C7-T1:Unremarkable.  IMPRESSION: No explanation for left-sided weakness.  No herniation or stenosis.   Electronically Signed   By: Monte Fantasia M.D.   On: 05/04/2015 12:40   US Carotid Bilateral  05/03/2015   CLINICAL DATA:  Left-sided numbness, possible cerebrovascular accident.  EXAM: BILATERAL CAROTID DUPLEX ULTRASOUND  TECHNIQUE: Pearline Cables scale imaging, color Doppler and duplex ultrasound were performed of bilateral carotid and vertebral arteries in the neck.  COMPARISON:  None.  FINDINGS: Criteria: Quantification of carotid stenosis is based on velocity parameters that correlate the residual internal carotid diameter with NASCET-based stenosis levels, using the diameter of the distal internal carotid lumen as the denominator for stenosis measurement.  The following velocity measurements were obtained:  RIGHT  ICA:  66/29 cm/sec  CCA:  67/34 cm/sec  SYSTOLIC ICA/CCA RATIO:  0.8  DIASTOLIC ICA/CCA RATIO:  1.1  ECA:  113 cm/sec  LEFT  ICA:  69/31 cm/sec  CCA:  19/37 cm/sec  SYSTOLIC ICA/CCA RATIO:  0.8  DIASTOLIC ICA/CCA RATIO:  1.3  ECA:  94 cm/sec  RIGHT CAROTID ARTERY: Mild plaque formation is noted in right carotid bulb and proximal right internal carotid artery consistent with less than 50% diameter stenosis based on ultrasound and Doppler criteria.  RIGHT VERTEBRAL ARTERY:  Antegrade flow is noted.  LEFT CAROTID ARTERY: Mild plaque formation is noted in the left carotid bulb and proximal left internal carotid artery consistent with less than 50% diameter stenosis based on ultrasound  and Doppler criteria.  LEFT VERTEBRAL ARTERY:  Antegrade flow is noted.  Incidental note is made of 2 complex but predominantly cystic nodules in the right thyroid lobe.  IMPRESSION: No hemodynamically significant plaque or stenosis is noted in the cervical carotid arteries bilaterally.  Two complex but predominantly cystic nodules are noted in the right thyroid lobe. Thyroid ultrasound is recommended for further evaluation.   Electronically Signed   By: Marijo Conception, M.D.   On: 05/03/2015 10:06     ASSESSMENT AND PLAN:   1. Left-sided weakness with speech disturbances, acute CVA RULED out with negative MRI and CT head -continue aspirin.  Spoke with Dr Jayme Cloud Neurology and recommends MRI cervical spine to r/o cervical myelopathy which is negative. Will repeat MRI brain (lot of RF for stroke) and MRI L-s spine to see if he has radiculopathy and djd. PT recommends rehab. CSW to see pt for d/c planning -pt has had a few falls in the last few weeks and his weakness is progressively getting worse. -echo noted  2. History of coronary artery disease status post angioplasty in the past. Patient stable no cardiac complaints at this time. 3.   History of depression/anxiety. Continue home medications. 4. History of Barrett's esophagitis. Stable, continue PPI. 5. History of prostate cancer status post seed implantation. Stable. Monitor. 6. ETOH use. Monitor for withdrawls. CIWA protocol. 7. Hypertension, stable on home medications, continue same. 8. PT to evaluate for gait/balance-recommends rehab. CSW consult  Code Status: Full code DVT Prophylaxis: SubQ lovenox.     All the records are reviewed and case discussed with Care Management/Social Workerr. Management plans discussed  with the patient, family and they are in agreement.  CODE STATUS: Full code  TOTAL TIME TAKING CARE OF THIS PATIENT: 35 minutes.   D/C DEPENDING ON CLINICAL CONDITION.   Mahalie Kanner M.D on 05/05/2015 at 9:37  AM  Between 7am to 6pm - Pager - [phone]  After 6pm go to [email or web address] - password [email or web address] [facility]  [place] [facility]  Office  [phone]  CC: Primary care physician; No PCP Per Patient

## 2015-05-06 NOTE — Progress Notes (Signed)
CSW spoke with pt re: PT recommendation for SNF. CSW explained placement process with Medicaid as payor source. Pt reports he plans to d/c home with assistance from his dtr and sister. Pt reports his dtr and sister are "getting the house ready" for him to return home. Pt reports agreeable to [facility] for outpt PT if HHPT is not an option. Pt refusing SNF at this time.  Wandra Feinstein, MSW, LCSW [phone] (weekend coverage)

## 2015-05-06 NOTE — Progress Notes (Signed)
Patient ID: Ian [name], male   DOB: June 17, 1961, 54 y.o.   MRN: [id number]  NEUROLOGY NOTE  S:  Pt reports that he has improving strength on L  ROS neg x 8 systems except L sided weakness  O:  AFVSS NAD, normal weight Normocephalic, sclerae nonicteric Supple, no JVD CTA B, no wheezing RRR, no murmurs No C/C/E  Alert and oriented x 3, nl speech PERRLA, EOMI, face symmetric 4/5 L, 5/5 R  MRI personally reviewed by me and normal   1.  B12 defiency-  This is likely the cause of weakness but there appears to be poor effort as well -  Continue cyanobalamin 1011mcg SQ daily -  Pending methylamalonic acid and homocysteine levels -  Continue therapy -  Can d/c at anytime

## 2015-05-06 NOTE — Progress Notes (Signed)
Physical Therapy Treatment Patient Details Name: Ian [name] MRN: [id number] DOB: 05-Mar-1961 Today's Date: 05/06/2015    History of Present Illness 54 yo RHD M presents [facility] after playing golf and noting some L sided weakness. Pt reports to me that he has had gradual L sided weakness over the past 6-8 weeks that has worsened.  CT and MRI negative for CVA at this time.  Neurologist to treat as CVA but is also concerned for conversion disorder.    PT Comments    Patient improving in bed mobility, transfers, and gait. Able to perform bed mobility with use of rails. Ambulated 30 ft with RW, close guarding, and physical assistance advancing LLE. Patient reports increased back pain with walking. Notified nurse of patient's request for pain medication. O2 sats remained 97-98% throughout treatment, HR increased from 88 bpm to 90 bpm. Respiratory rate increases with activity (30 breaths/min after supine to sit EOB). Patient is nervous and shaky during all activities.  Follow Up Recommendations  SNF     Equipment Recommendations  Rolling walker with 5" wheels    Recommendations for Other Services       Precautions / Restrictions Precautions Precautions: Fall Restrictions Weight Bearing Restrictions: No    Mobility  Bed Mobility Overal bed mobility: Modified Independent (with verbal cues) Bed Mobility: Rolling Rolling: Modified independent (Device/Increase time) (slow, uses bed rails)   Supine to sit: Supervision;Modified independent (Device/Increase time) Sit to supine: Modified independent (Device/Increase time);Supervision   General bed mobility comments: vc's to go through sidelying for supine-to-sit and back; hooks L ankle with R foot  Transfers Overall transfer level: Needs assistance Equipment used: Rolling walker (2 wheeled) Transfers: Sit to/from Stand Sit to Stand: Min assist         General transfer comment: Patient shaking, gripping strongly, and requiring cues  to hand placement and sequence but required little physical assistance to come to standing.   Ambulation/Gait Ambulation/Gait assistance: Mod assist Ambulation Distance (Feet): 30 Feet Assistive device: Rolling walker (2 wheeled) Gait Pattern/deviations: Step-to pattern (Leans R to partially clear L foot in swing; flexed posture)   Gait velocity interpretation: <1.8 ft/sec, indicative of risk for recurrent falls General Gait Details: Requires close guarding, frequent vc's to sequencing, and physical asst to advance L LE   Stairs            Wheelchair Mobility    Modified Rankin (Stroke Patients Only)       Balance Overall balance assessment: Needs assistance Sitting-balance support:  (unsupported at EOB)         Standing balance-Leahy Scale: Fair Standing balance comment: wide BOS; shaky                    Cognition Arousal/Alertness: Awake/alert Behavior During Therapy: WFL for tasks assessed/performed Overall Cognitive Status: Within Functional Limits for tasks assessed                      Exercises      General Comments        Pertinent Vitals/Pain Pain Assessment: 0-10 Pain Score: 8  Pain Location: back pain, L hip to L shoulder blade Pain Intervention(s): Monitored during session;Limited activity within patient's tolerance;Patient requesting pain meds-RN notified    Home Living                      Prior Function            PT Goals (current  goals can now be found in the care plan section) Acute Rehab PT Goals Patient Stated Goal: to walk PT Goal Formulation: With patient Time For Goal Achievement: 05/18/15 Potential to Achieve Goals: Good Progress towards PT goals: Progressing toward goals    Frequency  7X/week    PT Plan Current plan remains appropriate    Co-evaluation             End of Session Equipment Utilized During Treatment: Gait belt Activity Tolerance: Patient limited by fatigue;Patient  limited by pain;Other (comment) (O2 sats remained in high 90s, RR 30/min after bed mobility) Patient left: in chair;with call bell/phone within reach;with bed alarm set     Time: 1345-1410 PT Time Calculation (min) (ACUTE ONLY): 25 min  Charges:  $Gait Training: 8-22 mins $Therapeutic Activity: 8-22 mins                    G Codes:     Violet Baldy, PT, [place] 05/06/2015, 2:26 PM

## 2015-05-06 NOTE — Progress Notes (Signed)
Patient ID: MASAMI PLATA, male   DOB: 09-Nov-1961, 54 y.o.   MRN: [id number] [place] [facility] [place] NAME: Haik Mahoney    MR#:  [id number]  DATE OF BIRTH:  23-Oct-1961   PRIMARY CARE; NONE. MEDICAID CARD HAS appt with [facility]  SUBJECTIVE:  Came in with Left upper and LE weakness acute on chronic for few weeks. -left UE weakness much better. REVIEW OF SYSTEMS:   Review of Systems  Constitutional: Negative for fever, chills and weight loss.  HENT: Negative for ear discharge, ear pain and nosebleeds.   Eyes: Negative for blurred vision, pain and discharge.  Respiratory: Negative for sputum production, shortness of breath, wheezing and stridor.   Cardiovascular: Negative for chest pain, palpitations, orthopnea and PND.  Gastrointestinal: Negative for nausea, vomiting, abdominal pain and diarrhea.  Genitourinary: Negative for urgency and frequency.  Musculoskeletal: Negative for back pain and joint pain.  Neurological: Positive for focal weakness. Negative for sensory change, speech change and weakness.  Psychiatric/Behavioral: Negative for depression. The patient is not nervous/anxious.      DRUG ALLERGIES:   Allergies  Allergen Reactions  . Fluoxetine Swelling    Anxiety, itching  . Hydrocodone-Acetaminophen Itching    VITALS:  Blood pressure 121/80, pulse 80, temperature 97.8 F (36.6 C), temperature source Oral, resp. rate 24, height 6' (1.829 m), weight 113.49 kg (250 lb 3.2 oz), SpO2 99 %.  PHYSICAL EXAMINATION:  GENERAL:  54 y.o.-year-old patient lying in the bed with no acute distress.  EYES: Pupils equal, round, reactive to light and accommodation. No scleral icterus. Extraocular muscles intact.  HEENT: Head atraumatic, normocephalic. Oropharynx and nasopharynx clear.  NECK:  Supple, no jugular venous distention. No thyroid enlargement, no tenderness.  LUNGS: Normal breath sounds bilaterally, no wheezing,  rales,rhonchi or crepitation. No use of accessory muscles of respiration.  CARDIOVASCULAR: S1, S2 normal. No murmurs, rubs, or gallops.  ABDOMEN: Soft, nontender, nondistended. Bowel sounds present. No organomegaly or mass.  EXTREMITIES: No pedal edema, cyanosis, or clubbing.  NEUROLOGIC: Cranial nerves II through XII are intact. -improving weakness on the left UE and LE, DTR + in all 4 extremities. -decreased sensation on left side PSYCHIATRIC: The patient is alert and oriented x 3. anxious SKIN: No obvious rash, lesion, or ulcer.    LABORATORY PANEL:   CBC  Recent Labs Lab 05/03/15 0042  WBC 13.9*  HGB 17.0  HCT 51.5  PLT 232   ------------------------------------------------------------------------------------------------------------------  Chemistries   Recent Labs Lab 05/03/15 0042  NA 140  K 3.9  CL 105  CO2 25  GLUCOSE 116*  BUN 12  CREATININE 0.89  CALCIUM 9.0  AST 57*  ALT 71*  ALKPHOS 71  BILITOT 0.6   ------------------------------------------------------------------------------------------------------------------  Cardiac Enzymes  Recent Labs Lab 05/03/15 0042  TROPONINI <0.03   ------------------------------------------------------------------------------------------------------------------  RADIOLOGY:  Mr Brain Wo Contrast  05/05/2015   CLINICAL DATA:  LEFT-sided weakness is not improving. Continued surveillance.  EXAM: MRI HEAD WITHOUT CONTRAST  TECHNIQUE: Multiplanar, multiecho pulse sequences of the brain and surrounding structures were obtained without intravenous contrast.  COMPARISON:  MR head without and with contrast 05/03/2015.  Benign  FINDINGS: Because of claustrophobia, fast brain protocol was used.  No evidence for acute infarction, hemorrhage, mass lesion, hydrocephalus, or extra-axial fluid. Slight premature for age cerebral and cerebellar atrophy. Unchanged T2 and FLAIR hyperintensities throughout the white matter, greater than  expected for age, likely chronic microvascular ischemic change. No signs of  large vessel occlusion. No midline abnormalities. Extracranial soft tissues remain grossly unremarkable.  IMPRESSION: No interval change from prior study 2 days ago. No acute intracranial findings.  Slight premature for age atrophy with mild small vessel disease.   Electronically Signed   By: Rolla Flatten M.D.   On: 05/05/2015 12:56   Mr Lumbar Spine Wo Contrast  05/05/2015   CLINICAL DATA:  Progressive low back pain and left leg weakness.  EXAM: MRI LUMBAR SPINE WITHOUT CONTRAST  TECHNIQUE: Multiplanar, multisequence MR imaging of the lumbar spine was performed. No intravenous contrast was administered.  COMPARISON:  Radiographs dated 12/02/2014 and lumbar MRI dated 09/18/2012  FINDINGS: Normal conus tip at L1.  Normal paraspinal soft tissues.  T12-L1 and L1-2:  Normal.  L2-3:  Normal.  L3-4: Tiny central disc bulge with no neural impingement, unchanged.  L4-5: Tiny disc bulge with accompanying osteophytes into the left neural foramen with no neural impingement. No foraminal stenosis. Minimal degenerative changes of the facet joints, slightly progressed.  L5-S1: Chronic marked narrowing of the disc space. Small broad-based endplate osteophytes without neural impingement, unchanged. No foraminal or spinal stenosis. No facet arthritis.  IMPRESSION: 1. Degenerative disc disease at L5-S1, chronic with no neural impingement. 2. Minimal bilateral facet arthritis at L4-5, progressed.   Electronically Signed   By: Lorriane Shire M.D.   On: 05/05/2015 13:21     ASSESSMENT AND PLAN:   1. Left-sided weakness with speech disturbances, acute CVA RULED out with negative MRI and CT head -continue aspirin.  Spoke with Dr Jayme Cloud Neurology and recommends MRI cervical spine to r/o cervical myelopathy which is negative. repeat MRI brain negative -MRI L-s spine + for djd.  -PT recommends rehab. CSW to see pt for d/c planning -pt has had a few  falls in the last few weeks and his weakness is progressively getting worse. -echo noted - severe vitamin B12 def. Started on b12 shots per neuro  2. History of coronary artery disease status post angioplasty in the past. Patient stable no cardiac complaints at this time. 3.   History of depression/anxiety. Continue home medications. 4. History of Barrett's esophagitis. Stable, continue PPI. 5. History of prostate cancer status post seed implantation. Stable. Monitor. 6. ETOH use. Monitor for withdrawls. CIWA protocol. 7. Hypertension, stable on home medications, continue same. 8. PT to evaluate for gait/balance-recommends rehab. CSW consult 9. -Home with PT vs SNF  Code Status: Full code DVT Prophylaxis: SubQ lovenox.     All the records are reviewed and case discussed with Care Management/Social Workerr. Management plans discussed with the patient, family and they are in agreement.  CODE STATUS: Full code  TOTAL TIME TAKING CARE OF THIS PATIENT: 35 minutes.   D/C DEPENDING ON CLINICAL CONDITION.   Laddie Naeem M.D on 05/06/2015 at 1:54 PM  Between 7am to 6pm - Pager - [phone]  After 6pm go to [email or web address] - password [email or web address] [facility]  [place] [facility]  Office  [phone]  CC: Primary care physician; No PCP Per Patient

## 2015-05-07 MED ORDER — CYANOCOBALAMIN 1000 MCG/ML IJ SOLN: 1000.0000 ug | Freq: Every day | INTRAMUSCULAR | Status: AC

## 2015-05-07 MED ORDER — VITAMIN B-1 100 MG PO TABS
100.0000 mg | ORAL_TABLET | Freq: Every day | ORAL | Status: DC
  Administered 2015-05-07: 12:00:00 100 mg via ORAL
  Filled 2015-05-07: qty 1

## 2015-05-07 MED ORDER — ASPIRIN 81 MG PO TBEC: 81.0000 mg | DELAYED_RELEASE_TABLET | Freq: Every day | ORAL | Status: DC

## 2015-05-07 MED ORDER — THIAMINE HCL 50 MG PO TABS: 50.0000 mg | ORAL_TABLET | Freq: Every day | ORAL | Status: DC

## 2015-05-07 NOTE — Progress Notes (Signed)
Occupational Therapy Treatment Patient Details Name: Ian [name] MRN: [id number] DOB: [birth year] Today's Date: 05/07/2015    History of present illness 54 yo RHD M presents to [facility] after playing golf and noting some L sided weakness. Pt reports to me that he has had gradual L sided weakness over the past 6-8 weeks that has worsened.  CT and MRI negative for CVA at this time.  Neurologist to treat as CVA but is also concerned for conversion disorder.   OT comments  Pt seen for LUE and hand exercises sitting up in bed since he declined sitting EOB or getting up to chair due to not feeling very well today.  He presents with increased return of function in LUE and hand with full flexion and extension and partial opposition and shoulder flexion to 100 degrees today.  Rec OT HH since pt does not have an assigned MD in order to go to SNF.  Discussed rec with case manager/SW.  Follow Up Recommendations       Equipment Recommendations       Recommendations for Other Services      Precautions / Restrictions         Mobility Bed Mobility                  Transfers                      Balance                                   ADL                                                Vision                     Perception     Praxis      Cognition                             Extremity/Trunk Assessment               Exercises     Shoulder Instructions       General Comments      Pertinent Vitals/ Pain          Home Living                                          Prior Functioning/Environment              Frequency Min 1X/week     Progress Toward Goals  OT Goals(current goals can now be found in the care plan section)  Progress towards OT goals: Progressing toward goals  Acute Rehab OT Goals Patient Stated Goal: to get my arm and leg to keep working better OT Goal  Formulation: With patient  Plan Discharge plan remains appropriate    Co-evaluation          OT goals addressed during session: ADL's and self-care;Strengthening/ROM      End of Session     Activity Tolerance Patient limited by fatigue   Patient Left  in bed;with bed alarm set   Nurse Communication          Time: [phone] OT Time Calculation (min): 15 min  Charges: OT General Charges $OT Visit: 1 Procedure OT Treatments $Neuromuscular Re-education: 8-22 mins  Wofford,Susan 05/07/2015, 11:31 AM    Chrys Racer, OTR/L

## 2015-05-07 NOTE — Progress Notes (Signed)
PT Cancellation Note  Patient Details Name: Ian [name] MRN: [id number] DOB: 1961/07/26   Cancelled Treatment:    Reason Eval/Treat Not Completed: Patient declined, no reason specified (Pt states that he is "just worn out & don't feel good".)  Will re-attempt treatment as able and per pt's willingness to participate.   Raquel Sarna Sokha Craker 05/07/2015, 9:32 AM Leitha Bleak, [place]

## 2015-05-07 NOTE — Progress Notes (Signed)
Pt for discharge.home health to follow pt. Discharge instructions discussed with pt. Home meds discussed and presc.meds gone over with pt. Diet / activity and f/u discussed. Sl d/cd.  Ride for pt will be here around 3 pm per pt.pt reports that he understands all discharge plans.

## 2015-05-07 NOTE — Progress Notes (Signed)
Key Points: Use following P&T approved IV to PO medication change policy.  Description contains the criteria that are approved Note: Policy Excludes:  Esophagectomy patientsPHARMACIST - PHYSICIAN COMMUNICATION CONCERNING: IV to Oral Route Change Policy  RECOMMENDATION: This patient is receiving thiamine by the intravenous route.  Based on criteria approved by the Pharmacy and Therapeutics Committee, the intravenous medication(s) is/are being converted to the equivalent oral dose form(s).   DESCRIPTION: These criteria include:  The patient is eating (either orally or via tube) and/or has been taking other orally administered medications for a least 24 hours  The patient has no evidence of active gastrointestinal bleeding or impaired GI absorption (gastrectomy, short bowel, patient on TNA or NPO).  If you have questions about this conversion, please contact the Pharmacy Department  []   [phone] )  [facility] [x]   [phone] )  [facility] []   [phone] )  [facility] []   [phone] )  [facility] []   [phone] )  [place], PharmD Clinical Pharmacist 05/07/2015 10:26 AM

## 2015-05-07 NOTE — Care Management (Signed)
Discharge to home today per Dr. Posey Pronto. Will need Home Health, physical therapy, and occupational therapy in the home. [facility] for nursing services and rolling walker.  Sister will transport this afternoon. Shelbie Ammons RN MSN  Care Management

## 2015-05-07 NOTE — Discharge Instructions (Signed)
Home health nursing to teach tp and family to take IM B12 shots Alcohol Intoxication Alcohol intoxication occurs when you drink enough alcohol that it affects your ability to function. It can be mild or very severe. Drinking a lot of alcohol in a short time is called binge drinking. This can be very harmful. Drinking alcohol can also be more dangerous if you are taking medicines or other drugs. Some of the effects caused by alcohol may include:  Loss of coordination.  Changes in mood and behavior.  Unclear thinking.  Trouble talking (slurred speech).  Throwing up (vomiting).  Confusion.  Slowed breathing.  Twitching and shaking (seizures).  Loss of consciousness. HOME CARE  Do not drive after drinking alcohol.  Drink enough water and fluids to keep your pee (urine) clear or pale yellow. Avoid caffeine.  Only take medicine as told by your doctor. GET HELP IF:  You throw up (vomit) many times.  You do not feel better after a few days.  You frequently have alcohol intoxication. Your doctor can help decide if you should see a substance use treatment counselor. GET HELP RIGHT AWAY IF:  You become shaky when you stop drinking.  You have twitching and shaking.  You throw up blood. It may look bright red or like coffee grounds.  You notice blood in your poop (bowel movements).  You become lightheaded or pass out (faint). MAKE SURE YOU:   Understand these instructions.  Will watch your condition.  Will get help right away if you are not doing well or get worse. Document Released: 05/26/2008 Document Revised: 08/10/2013 Document Reviewed: 05/13/2013 [facility] Patient Information 2015 [place], [place]. This information is not intended to replace advice given to you by your health care provider. Make sure you discuss any questions you have with your health care provider.  Alcohol Intoxication Alcohol intoxication occurs when the amount of alcohol that a person has consumed  impairs his or her ability to mentally and physically function. Alcohol directly impairs the normal chemical activity of the brain. Drinking large amounts of alcohol can lead to changes in mental function and behavior, and it can cause many physical effects that can be harmful.  Alcohol intoxication can range in severity from mild to very severe. Various factors can affect the level of intoxication that occurs, such as the person's age, gender, weight, frequency of alcohol consumption, and the presence of other medical conditions (such as diabetes, seizures, or heart conditions). Dangerous levels of alcohol intoxication may occur when people drink large amounts of alcohol in a short period (binge drinking). Alcohol can also be especially dangerous when combined with certain prescription medicines or "recreational" drugs. SIGNS AND SYMPTOMS Some common signs and symptoms of mild alcohol intoxication include:  Loss of coordination.  Changes in mood and behavior.  Impaired judgment.  Slurred speech. As alcohol intoxication progresses to more severe levels, other signs and symptoms will appear. These may include:  Vomiting.  Confusion and impaired memory.  Slowed breathing.  Seizures.  Loss of consciousness. DIAGNOSIS  Your health care provider will take a medical history and perform a physical exam. You will be asked about the amount and type of alcohol you have consumed. Blood tests will be done to measure the concentration of alcohol in your blood. In many places, your blood alcohol level must be lower than 80 mg/dL (0.08%) to legally drive. However, many dangerous effects of alcohol can occur at much lower levels.  TREATMENT  People with alcohol intoxication often do  not require treatment. Most of the effects of alcohol intoxication are temporary, and they go away as the alcohol naturally leaves the body. Your health care provider will monitor your condition until you are stable enough to  go home. Fluids are sometimes given through an IV access tube to help prevent dehydration.  HOME CARE INSTRUCTIONS  Do not drive after drinking alcohol.  Stay hydrated. Drink enough water and fluids to keep your urine clear or pale yellow. Avoid caffeine.   Only take over-the-counter or prescription medicines as directed by your health care provider.  SEEK MEDICAL CARE IF:   You have persistent vomiting.   You do not feel better after a few days.  You have frequent alcohol intoxication. Your health care provider can help determine if you should see a substance use treatment counselor. SEEK IMMEDIATE MEDICAL CARE IF:   You become shaky or tremble when you try to stop drinking.   You shake uncontrollably (seizure).   You throw up (vomit) blood. This may be bright red or may look like black coffee grounds.   You have blood in your stool. This may be bright red or may appear as a black, tarry, bad smelling stool.   You become lightheaded or faint.  MAKE SURE YOU:   Understand these instructions.  Will watch your condition.  Will get help right away if you are not doing well or get worse. Document Released: 09/17/2005 Document Revised: 08/10/2013 Document Reviewed: 05/13/2013 [facility] Patient Information 2015 [place], [place]. This information is not intended to replace advice given to you by your health care provider. Make sure you discuss any questions you have with your health care provider.  Alcohol and Nutrition Nutrition serves two purposes. It provides energy. It also maintains body structure and function. Food supplies energy. It also provides the building blocks needed to replace worn or damaged cells. Alcoholics often eat poorly. This limits their supply of essential nutrients. This affects energy supply and structure maintenance. Alcohol also affects the body's nutrients in:  Digestion.  Storage.  Using and getting rid of waste products. IMPAIRMENT OF NUTRIENT  DIGESTION AND UTILIZATION   Once ingested, food must be broken down into small components (digested). Then it is available for energy. It helps maintain body structure and function. Digestion begins in the mouth. It continues in the stomach and intestines, with help from the pancreas. The nutrients from digested food are absorbed from the intestines into the blood. Then they are carried to the liver. The liver prepares nutrients for:  Immediate use.  Storage and future use.  Alcohol inhibits the breakdown of nutrients into usable molecules.  It decreases secretion of digestive enzymes from the pancreas.  Alcohol impairs nutrient absorption by damaging the cells lining the stomach and intestines.  It also interferes with moving some nutrients into the blood.  In addition, nutritional deficiencies themselves may lead to further absorption problems.  For example, folate deficiency changes the cells that line the small intestine. This impairs how water is absorbed. It also affects absorbed nutrients. These include glucose, sodium, and additional folate.  Even if nutrients are digested and absorbed, alcohol can prevent them from being fully used. It changes their transport, storage, and excretion. Impaired utilization of nutrients by alcoholics is indicated by:  Decreased liver stores of vitamins, such as vitamin A.  Increased excretion of nutrients such as fat. ALCOHOL AND ENERGY SUPPLY   Three basic nutritional components found in food are:  Carbohydrates.  Proteins.  Fats.  These are used as energy. Some alcoholics take in as much as 50% of their total daily calories from alcohol. They often neglect important foods.  Even when enough food is eaten, alcohol can impair the ways the body controls blood sugar (glucose) levels. It may either increase or decrease blood sugar.  In non-diabetic alcoholics, increased blood sugar (hyperglycemia) is caused by poor insulin secretion. It is  usually temporary.  Decreased blood sugar (hypoglycemia) can cause serious injury even if this condition is short-lived. Low blood sugar can happen when a fasting or malnourished person drinks alcohol. When there is no food to supply energy, stored sugar is used up. The products of alcohol inhibit forming glucose from other compounds such as amino acids. As a result, alcohol causes the brain and other body tissue to lack glucose. It is needed for energy and function.  Alcohol is an energy source. But how the body processes and uses the energy from alcohol is complex. Also, when alcohol is substituted for carbohydrates, subjects tend to lose weight. This indicates that they get less energy from alcohol than from food. ALCOHOL - MAINTAINING CELL STRUCTURE AND FUNCTION  Structure Cells are made mostly of protein. So an adequate protein diet is important for maintaining cell structure. This is especially true if cells are being damaged. Research indicates that alcohol affects protein nutrition by causing impaired:  Digestion of proteins to amino acids.  Processing of amino acids by the small intestine and liver.  Synthesis of proteins from amino acids.  Protein secretion by the liver. Function Nutrients are essential for the body to function well. They provide the tools that the body needs to work well:   Proteins.  Vitamins.  Minerals. Alcohol can disrupt body function. It may cause nutrient deficiencies. And it may interfere with the way nutrients are processed. Vitamins  Vitamins are essential to maintain growth and normal metabolism. They regulate many of the body`s processes. Chronic heavy drinking causes deficiencies in many vitamins. This is caused by eating less. And, in some cases, vitamins may be poorly absorbed. For example, alcohol inhibits fat absorption. It impairs how the vitamins A, E, and D are normally absorbed along with dietary fats. Not enough vitamin A may cause night  blindness. Not enough vitamin D may cause softening of the bones.  Some alcoholics lack vitamins A, C, D, E, K, and the B vitamins. These are all involved in wound healing and cell maintenance. In particular, because vitamin K is necessary for blood clotting, lacking that vitamin can cause delayed clotting. The result is excess bleeding. Lacking other vitamins involved in brain function may cause severe neurological damage. Minerals Deficiencies of minerals such as calcium, magnesium, iron, and zinc are common in alcoholics. The alcohol itself does not seem to affect how these minerals are absorbed. Rather, they seem to occur secondary to other alcohol-related problems, such as:  Less calcium absorbed.  Not enough magnesium.  More urinary excretion.  Vomiting.  Diarrhea.  Not enough iron due to gastrointestinal bleeding.  Not enough zinc or losses related to other nutrient deficiencies.  Mineral deficiencies can cause a variety of medical consequences. These range from calcium-related bone disease to zinc-related night blindness and skin lesions. ALCOHOL, MALNUTRITION, AND MEDICAL COMPLICATIONS  Liver Disease   Alcoholic liver damage is caused primarily by alcohol itself. But poor nutrition may increase the risk of alcohol-related liver damage. For example, nutrients normally found in the liver are known to be affected by drinking alcohol. These include  carotenoids, which are the major sources of vitamin A, and vitamin E compounds. Decreases in such nutrients may play some role in alcohol-related liver damage. Pancreatitis  Research suggests that malnutrition may increase the risk of developing alcoholic pancreatitis. Research suggests that a diet lacking in protein may increase alcohol's damaging effect on the pancreas. Brain  Nutritional deficiencies may have severe effects on brain function. These may be permanent. Specifically, thiamine deficiencies are often seen in alcoholics.  They can cause severe neurological problems. These include:  Impaired movement.  Memory loss seen in Wernicke-Korsakoff syndrome. Pregnancy  Alcohol has toxic effects on fetal development. It causes alcohol-related birth defects. They include fetal alcohol syndrome. Alcohol itself is toxic to the fetus. Also, the nutritional deficiency can affect how the fetus develops. That may compound the risk of developmental damage.  Nutritional needs during pregnancy are 10% to 30% greater than normal. Food intake can increase by as much as 140% to cover the needs of both mother and fetus. An alcoholic mother`s nutritional problems may adversely affect the nutrition of the fetus. And alcohol itself can also restrict nutrition flow to the fetus. NUTRITIONAL STATUS OF ALCOHOLICS  Techniques for assessing nutritional status include:  Taking body measurements to estimate fat reserves. They include:  Weight.  Height.  Mass.  Skin fold thickness.  Performing blood analysis to provide measurements of circulating:  Proteins.  Vitamins.  Minerals.  These techniques tend to be imprecise. For many nutrients, there is no clear "cut-off" point that would allow an accurate definition of deficiency. So assessing the nutritional status of alcoholics is limited by these techniques. Dietary status may provide information about the risk of developing nutritional problems. Dietary status is assessed by:  Taking patients' dietary histories.  Evaluating the amount and types of food they are eating.  It is difficult to determine what exact amount of alcohol begins to have damaging effects on nutrition. In general, moderate drinkers have 2 drinks or less per day. They seem to be at little risk for nutritional problems. Various medical disorders begin to appear at greater levels.  Research indicates that the majority of even the heaviest drinkers have few obvious nutritional deficiencies. Many alcoholics who are  hospitalized for medical complications of their disease do have severe malnutrition. Alcoholics tend to eat poorly. Often they eat less than the amounts of food necessary to provide enough:  Carbohydrates.  Protein.  Fat.  Vitamins A and C.  B vitamins.  Minerals like calcium and iron. Of major concern is alcohol's effect on digesting food and use of nutrients. It may shift a mildly malnourished person toward severe malnutrition. Document Released: 10/02/2005 Document Revised: 03/01/2012 Document Reviewed: 03/18/2006 [facility] Patient Information 2015 [place], [place]. This information is not intended to replace advice given to you by your health care provider. Make sure you discuss any questions you have with your health care provider.

## 2015-05-07 NOTE — Discharge Summary (Signed)
[facility] [place] NAME: Ian [name]    MR#:  [id number]  DATE OF BIRTH:  03-28-1961  DATE OF ADMISSION:  05/02/2015 ADMITTING PHYSICIAN: Fritzi Mandes, MD  DATE OF DISCHARGE: 05/07/15  PRIMARY CARE PHYSICIAN:[facility] in [place] DIAGNOSIS:  CVA (cerebral vascular accident) [I63.9] CVA (cerebral infarction) [I63.9] Acute left-sided muscle weakness [M62.89] Left-sided weakness [M62.89] Alcohol intoxication, uncomplicated [X52.841] Chest pain, unspecified chest pain type [R07.9]  DISCHARGE DIAGNOSIS:  Left sided weakness due to Vitamin b12 deficiency Chronic alcohol abuse HT Chronic Anxiety  SECONDARY DIAGNOSIS:   Past Medical History  Diagnosis Date  . Cancer   . Hypertension   . Depression   . Anxiety   . Coronary artery disease   . GERD (gastroesophageal reflux disease)     HOSPITAL COURSE:   1. Left-sided weakness with speech disturbances, acute CVA RULED out with negative MRI and CT head -continue aspirin.  - repeat MRI brain negative -MRI L-s spine + for djd. MRI cervical spine neg for myelopathy -PT recommends rehab. CSW to see pt for d/c planning--->pt doing well. Wants to go home HHPT and arranged -pt has had a few falls in the last few weeks and his weakness is progressively getting worse. -echo noted - severe vitamin B12 def. Started on b12 shots per neuro x7 days.  -pt will f/u [facility] for b12 levels and further treatment if needed  2. History of coronary artery disease status post angioplasty in the past. Patient stable no cardiac complaints at this time. 3. History of depression/anxiety. Continue home medications. 4. History of Barrett's esophagitis. Stable, continue PPI. 5. History of prostate cancer status post seed implantation. Stable. Monitor. 6. ETOH use. remains stable. No s/o withdrawal noted 7. Hypertension, stable on home medications, continue same. 8. PT  to evaluate for gait/balance-recommends rehab. CSW consult 9. -Home with PT per pt request. Rolling walker prescribed. His dter and sister will help at home.  DISCHARGE CONDITIONS:   fair  CONSULTS OBTAINED:  Treatment Team:  Valora Corporal, MD  DRUG ALLERGIES:   Allergies  Allergen Reactions  . Fluoxetine Swelling    Anxiety, itching  . Hydrocodone-Acetaminophen Itching    DISCHARGE MEDICATIONS:   Current Discharge Medication List    START taking these medications   Details  aspirin EC 81 MG EC tablet Take 1 tablet (81 mg total) by mouth daily. Qty: 30 tablet, Refills: 0    cyanocobalamin (,VITAMIN B-12,) 1000 MCG/ML injection Inject 1 mL (1,000 mcg total) into the muscle daily at 12 noon. Qty: 1 mL, Refills: 0    thiamine 50 MG tablet Take 1 tablet (50 mg total) by mouth daily. Qty: 30 tablet, Refills: 0      CONTINUE these medications which have NOT CHANGED   Details  citalopram (CELEXA) 20 MG tablet Take 20 mg by mouth at bedtime. Refills: 0    !! clonazePAM (KLONOPIN) 0.5 MG tablet Take 0.5 mg by mouth at bedtime.  Refills: 0    !! clonazePAM (KLONOPIN) 0.5 MG tablet Take 1 mg by mouth 2 (two) times daily as needed for anxiety.    lisinopril (PRINIVIL,ZESTRIL) 10 MG tablet Take 10 mg by mouth daily. Refills: 0    omeprazole (PRILOSEC) 40 MG capsule Take 40 mg by mouth 2 (two) times daily.    PROAIR HFA 108 (90 BASE) MCG/ACT inhaler Refills: 0    !! QUEtiapine (SEROQUEL) 50 MG tablet Take 1 tablet (  50 mg total) by mouth daily at 12 noon. Qty: 30 tablet, Refills: 0    SEROQUEL XR 400 MG 24 hr tablet Take 400 mg by mouth at bedtime. Refills: 0    hydrOXYzine (ATARAX/VISTARIL) 50 MG tablet Take 1 tablet by mouth 2 (two) times daily as needed. Refills: 0    oxyCODONE-acetaminophen (PERCOCET/ROXICET) 5-325 MG per tablet Take 1 tablet by mouth every 4 (four) hours as needed for severe pain.    !! QUEtiapine (SEROQUEL) 300 MG tablet Take 1 tablet (300 mg  total) by mouth at bedtime. Qty: 30 tablet, Refills: 0    sucralfate (CARAFATE) 1 GM/10ML suspension Take 1 g by mouth 4 (four) times daily.     !! - Potential duplicate medications found. Please discuss with provider.       DISCHARGE INSTRUCTIONS:    As above  If you experience worsening of your admission symptoms, develop shortness of breath, life threatening emergency, suicidal or homicidal thoughts you must seek medical attention immediately by calling 911 or calling your MD immediately  if symptoms less severe.  You Must read complete instructions/literature along with all the possible adverse reactions/side effects for all the Medicines you take and that have been prescribed to you. Take any new Medicines after you have completely understood and accept all the possible adverse reactions/side effects.   Please note  You were cared for by a hospitalist during your hospital stay. If you have any questions about your discharge medications or the care you received while you were in the hospital after you are discharged, you can call the unit and asked to speak with the hospitalist on call if the hospitalist that took care of you is not available. Once you are discharged, your primary care physician will handle any further medical issues. Please note that NO REFILLS for any discharge medications will be authorized once you are discharged, as it is imperative that you return to your primary care physician (or establish a relationship with a primary care physician if you do not have one) for your aftercare needs so that they can reassess your need for medications and monitor your lab values.    Today   SUBJECTIVE   Improving left sided weakness. Using RW by himself and walking in the room   VITAL SIGNS:  Blood pressure 102/67, pulse 65, temperature 97.5 F (36.4 C), temperature source Oral, resp. rate 18, height 6' (1.829 m), weight 116.801 kg (257 lb 8 oz), SpO2 96 %.  I/O:    Intake/Output Summary (Last 24 hours) at 05/07/15 1147 Last data filed at 05/07/15 0900  Gross per 24 hour  Intake   1040 ml  Output   1900 ml  Net   -860 ml    PHYSICAL EXAMINATION:  GENERAL: 54 y.o.-year-old patient lying in the bed with no acute distress.  EYES: Pupils equal, round, reactive to light and accommodation. No scleral icterus. Extraocular muscles intact.  HEENT: Head atraumatic, normocephalic. Oropharynx and nasopharynx clear.  NECK: Supple, no jugular venous distention. No thyroid enlargement, no tenderness.  LUNGS: Normal breath sounds bilaterally, no wheezing, rales,rhonchi or crepitation. No use of accessory muscles of respiration.  CARDIOVASCULAR: S1, S2 normal. No murmurs, rubs, or gallops.  ABDOMEN: Soft, nontender, nondistended. Bowel sounds present. No organomegaly or mass.  EXTREMITIES: No pedal edema, cyanosis, or clubbing.  NEUROLOGIC: Cranial nerves II through XII are intact. -improving weakness on the left UE and LE, DTR + in all 4 extremities. -decreased sensation on left side PSYCHIATRIC:  The patient is alert and oriented x 3. anxious SKIN: No obvious rash, lesion, or ulcer.    DATA REVIEW:   CBC  Recent Labs Lab 05/03/15 0042  WBC 13.9*  HGB 17.0  HCT 51.5  PLT 232    Chemistries   Recent Labs Lab 05/03/15 0042  NA 140  K 3.9  CL 105  CO2 25  GLUCOSE 116*  BUN 12  CREATININE 0.89  CALCIUM 9.0  AST 57*  ALT 71*  ALKPHOS 71  BILITOT 0.6    Cardiac Enzymes  Recent Labs Lab 05/03/15 0042  TROPONINI <0.03    Microbiology Results   No results found for this or any previous visit (from the past 240 hour(s)).  RADIOLOGY:  Mr Herby Abraham Contrast  05/05/2015   CLINICAL DATA:  LEFT-sided weakness is not improving. Continued surveillance.  EXAM: MRI HEAD WITHOUT CONTRAST  TECHNIQUE: Multiplanar, multiecho pulse sequences of the brain and surrounding structures were obtained without intravenous contrast.  COMPARISON:   MR head without and with contrast 05/03/2015.  Benign  FINDINGS: Because of claustrophobia, fast brain protocol was used.  No evidence for acute infarction, hemorrhage, mass lesion, hydrocephalus, or extra-axial fluid. Slight premature for age cerebral and cerebellar atrophy. Unchanged T2 and FLAIR hyperintensities throughout the white matter, greater than expected for age, likely chronic microvascular ischemic change. No signs of large vessel occlusion. No midline abnormalities. Extracranial soft tissues remain grossly unremarkable.  IMPRESSION: No interval change from prior study 2 days ago. No acute intracranial findings.  Slight premature for age atrophy with mild small vessel disease.   Electronically Signed   By: Rolla Flatten M.D.   On: 05/05/2015 12:56   Mr Lumbar Spine Wo Contrast  05/05/2015   CLINICAL DATA:  Progressive low back pain and left leg weakness.  EXAM: MRI LUMBAR SPINE WITHOUT CONTRAST  TECHNIQUE: Multiplanar, multisequence MR imaging of the lumbar spine was performed. No intravenous contrast was administered.  COMPARISON:  Radiographs dated 12/02/2014 and lumbar MRI dated 09/18/2012  FINDINGS: Normal conus tip at L1.  Normal paraspinal soft tissues.  T12-L1 and L1-2:  Normal.  L2-3:  Normal.  L3-4: Tiny central disc bulge with no neural impingement, unchanged.  L4-5: Tiny disc bulge with accompanying osteophytes into the left neural foramen with no neural impingement. No foraminal stenosis. Minimal degenerative changes of the facet joints, slightly progressed.  L5-S1: Chronic marked narrowing of the disc space. Small broad-based endplate osteophytes without neural impingement, unchanged. No foraminal or spinal stenosis. No facet arthritis.  IMPRESSION: 1. Degenerative disc disease at L5-S1, chronic with no neural impingement. 2. Minimal bilateral facet arthritis at L4-5, progressed.   Electronically Signed   By: Lorriane Shire M.D.   On: 05/05/2015 13:21       Management plans  discussed with the patient, family and they are in agreement.  CODE STATUS:     Code Status Orders        Start     Ordered   05/03/15 0426  Full code   Continuous     05/03/15 0425      TOTAL TIME TAKING CARE OF THIS PATIENT: 35 minutes.    Idamay Hosein M.D on 05/07/2015 at 11:47 AM  Between 7am to 6pm - Pager - [phone] After 6pm go to [email or web address] - password [email or web address] [facility]  [facility]  Office  [phone]  CC: Primary care physician; No PCP Per Patient

## 2015-05-07 NOTE — Clinical Social Work Note (Signed)
Pt is ready for discharge today and will return home. RNCM is following for discharge planning needs. CSW is signing off as no further needs identified.   Darden Dates, MSW, LCSW Clinical Social Worker  [phone]

## 2015-05-09 LAB — MISC LABCORP TEST (SEND OUT): Labcorp test code: 706994

## 2015-05-09 LAB — METHYLMALONIC ACID, SERUM: METHYLMALONIC ACID, QUANTITATIVE: 408 nmol/L — AB (ref 0–378)

## 2015-07-08 ENCOUNTER — Encounter: Payer: Self-pay | Admitting: Emergency Medicine

## 2015-07-08 ENCOUNTER — Emergency Department: Payer: Medicaid Other

## 2015-07-08 ENCOUNTER — Emergency Department
Admission: EM | Admit: 2015-07-08 | Discharge: 2015-07-09 | Disposition: A | Discharge status: Home / Self Care | Payer: Medicaid Other | Attending: Emergency Medicine | Admitting: Emergency Medicine

## 2015-07-08 DIAGNOSIS — Z720 Tobacco use: Secondary | ICD-10-CM | POA: Insufficient documentation

## 2015-07-08 DIAGNOSIS — I2510 Atherosclerotic heart disease of native coronary artery without angina pectoris: Secondary | ICD-10-CM | POA: Diagnosis not present

## 2015-07-08 DIAGNOSIS — I10 Essential (primary) hypertension: Secondary | ICD-10-CM | POA: Diagnosis not present

## 2015-07-08 DIAGNOSIS — Z7982 Long term (current) use of aspirin: Secondary | ICD-10-CM | POA: Insufficient documentation

## 2015-07-08 DIAGNOSIS — R079 Chest pain, unspecified: Secondary | ICD-10-CM | POA: Insufficient documentation

## 2015-07-08 DIAGNOSIS — Z79899 Other long term (current) drug therapy: Secondary | ICD-10-CM | POA: Diagnosis not present

## 2015-07-08 DIAGNOSIS — R1110 Vomiting, unspecified: Secondary | ICD-10-CM | POA: Diagnosis not present

## 2015-07-08 DIAGNOSIS — R1013 Epigastric pain: Secondary | ICD-10-CM | POA: Diagnosis not present

## 2015-07-08 HISTORY — DX: Barrett's esophagus without dysplasia: K22.70

## 2015-07-08 LAB — COMPREHENSIVE METABOLIC PANEL
ALBUMIN: 4.5 g/dL (ref 3.5–5.0)
ALT: 56 U/L (ref 17–63)
AST: 40 U/L (ref 15–41)
Alkaline Phosphatase: 57 U/L (ref 38–126)
Anion gap: 13 (ref 5–15)
BILIRUBIN TOTAL: 0.5 mg/dL (ref 0.3–1.2)
BUN: 19 mg/dL (ref 6–20)
CHLORIDE: 105 mmol/L (ref 101–111)
CO2: 22 mmol/L (ref 22–32)
CREATININE: 1.22 mg/dL (ref 0.61–1.24)
Calcium: 9.6 mg/dL (ref 8.9–10.3)
GFR calc Af Amer: >60 mL/min (ref >60)
GFR calc non Af Amer: >60 mL/min (ref >60)
Glucose, Bld: 87 mg/dL (ref 65–99)
POTASSIUM: 3.7 mmol/L (ref 3.5–5.1)
Sodium: 140 mmol/L (ref 135–145)
TOTAL PROTEIN: 7.5 g/dL (ref 6.5–8.1)

## 2015-07-08 LAB — CBC WITH DIFFERENTIAL/PLATELET
BASOS ABS: 0.1 10*3/uL (ref 0–0.1)
Basophils Relative: 1 %
Eosinophils Absolute: 0.2 10*3/uL (ref 0–0.7)
Eosinophils Relative: 1 %
HCT: 55.6 % — ABNORMAL HIGH (ref 40.0–52.0)
HEMOGLOBIN: 18.2 g/dL — AB (ref 13.0–18.0)
LYMPHS PCT: 30 %
Lymphs Abs: 4.5 10*3/uL — ABNORMAL HIGH (ref 1.0–3.6)
MCH: 29.4 pg (ref 26.0–34.0)
MCHC: 32.7 g/dL (ref 32.0–36.0)
MCV: 90 fL (ref 80.0–100.0)
Monocytes Absolute: 1 10*3/uL (ref 0.2–1.0)
Monocytes Relative: 7 %
NEUTROS ABS: 9.1 10*3/uL — AB (ref 1.4–6.5)
Neutrophils Relative %: 61 %
Platelets: 233 10*3/uL (ref 150–440)
RBC: 6.19 MIL/uL — AB (ref 4.40–5.90)
RDW: 13.8 % (ref 11.5–14.5)
WBC: 15 10*3/uL — AB (ref 3.8–10.6)

## 2015-07-08 LAB — TROPONIN I

## 2015-07-08 LAB — LIPASE, BLOOD: LIPASE: 24 U/L (ref 22–51)

## 2015-07-08 MED ORDER — ONDANSETRON HCL 4 MG/2ML IJ SOLN
4.0000 mg | Freq: Once | INTRAMUSCULAR | Status: AC
  Administered 2015-07-08: 4 mg via INTRAVENOUS
  Filled 2015-07-08: qty 2

## 2015-07-08 MED ORDER — HYDROMORPHONE HCL 1 MG/ML IJ SOLN
1.0000 mg | Freq: Once | INTRAMUSCULAR | Status: AC
  Administered 2015-07-08: 1 mg via INTRAVENOUS
  Filled 2015-07-08: qty 1

## 2015-07-08 MED ORDER — IOHEXOL 350 MG/ML SOLN
100.0000 mL | Freq: Once | INTRAVENOUS | Status: AC | PRN
  Administered 2015-07-08: 100 mL via INTRAVENOUS

## 2015-07-08 MED ORDER — IOHEXOL 240 MG/ML SOLN
25.0000 mL | Freq: Once | INTRAMUSCULAR | Status: AC | PRN
  Administered 2015-07-08: 25 mL via ORAL

## 2015-07-08 MED ORDER — SODIUM CHLORIDE 0.9 % IV SOLN
1000.0000 mL | Freq: Once | INTRAVENOUS | Status: AC
  Administered 2015-07-08: 1000 mL via INTRAVENOUS

## 2015-07-08 MED ORDER — HYDROMORPHONE HCL 1 MG/ML IJ SOLN
INTRAMUSCULAR | Status: AC
  Administered 2015-07-08: 1 mg via INTRAVENOUS
  Filled 2015-07-08: qty 1

## 2015-07-08 MED ORDER — ONDANSETRON HCL 4 MG/2ML IJ SOLN
4.0000 mg | Freq: Once | INTRAMUSCULAR | Status: AC
  Administered 2015-07-08: 4 mg via INTRAVENOUS

## 2015-07-08 MED ORDER — HYDROMORPHONE HCL 1 MG/ML IJ SOLN
1.0000 mg | Freq: Once | INTRAMUSCULAR | Status: AC
  Administered 2015-07-08: 1 mg via INTRAVENOUS

## 2015-07-08 MED ORDER — ONDANSETRON HCL 4 MG/2ML IJ SOLN
INTRAMUSCULAR | Status: AC
  Administered 2015-07-08: 4 mg via INTRAVENOUS
  Filled 2015-07-08: qty 2

## 2015-07-08 NOTE — ED Notes (Signed)
Dr. Stafford at bedside.  

## 2015-07-08 NOTE — ED Notes (Signed)
Pt. 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  Pt. States he goes to [facility] for biopsies and scraping done.  Pt. Does not now last results.

## 2015-07-08 NOTE — ED Notes (Signed)
Patient transported to CT 

## 2015-07-08 NOTE — ED Notes (Signed)
Pt presents to ER alert and in moderate distress. Pt is restless, resp even and unlabored. Pt states CP, abd pain, vomiting. Pt reports ETOH use today and states he has been working outside in the heat.

## 2015-07-08 NOTE — ED Notes (Signed)
MD at bedside. 

## 2015-07-08 NOTE — Discharge Instructions (Signed)

## 2015-07-08 NOTE — ED Provider Notes (Signed)
[facility] Emergency Department Provider Note  ____________________________________________  Time seen: 9:15 PM on arrival by EMS  I have reviewed the triage vital signs and the nursing notes.   HISTORY  Chief Complaint Emesis and Abdominal Pain    HPI Ian [name] is a 54 y.o. male who complains of chest pain in his middle chest that started earlier this afternoon. He reports drinking one beer after the pain started and that it did not start as a cause of the beer. The chest pain radiates to the back, and is associated with vomiting. He started having abdominal pain after vomiting as well. Denies fever or chills, syncope or dizziness, or shortness of breath. He does have a history of prostate cancer and Barrett's esophagus   Past Medical History  Diagnosis Date  . Cancer   . Hypertension   . Depression   . Anxiety   . Coronary artery disease   . GERD (gastroesophageal reflux disease)   . Barrett esophagus     Patient Active Problem List   Diagnosis Date Noted  . Acute left-sided weakness 05/03/2015  . CVA (cerebral infarction) 05/03/2015  . Weakness 11/16/2013  . Chest pain 11/16/2013  . HTN (hypertension) 11/16/2013  . Left-sided weakness 11/16/2013  . 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 Hemiplegia, unspecified, affecting nondominant side 11/15/2013  . Speech and language deficits 11/15/2013    Past Surgical History  Procedure Laterality Date  . Appendectomy    . Cholecystectomy    . Angioplasty    . Cardiac catheterization      Current Outpatient Rx  Name  Route  Sig  Dispense  Refill  . aspirin EC 81 MG EC tablet   Oral   Take 1 tablet (81 mg total) by mouth daily.   30 tablet   0   . citalopram (CELEXA) 20 MG tablet   Oral   Take 20 mg by mouth at bedtime.      0   . clonazePAM (KLONOPIN) 0.5 MG tablet   Oral   Take 0.5 mg by mouth at bedtime.       0   . clonazePAM (KLONOPIN) 0.5 MG tablet   Oral   Take 1 mg by  mouth 2 (two) times daily as needed for anxiety.         . hydrOXYzine (ATARAX/VISTARIL) 50 MG tablet   Oral   Take 1 tablet by mouth 2 (two) times daily as needed.      0   . lisinopril (PRINIVIL,ZESTRIL) 10 MG tablet   Oral   Take 10 mg by mouth daily.      0   . omeprazole (PRILOSEC) 40 MG capsule   Oral   Take 40 mg by mouth 2 (two) times daily.         Marland Kitchen oxyCODONE-acetaminophen (PERCOCET/ROXICET) 5-325 MG per tablet   Oral   Take 1 tablet by mouth every 4 (four) hours as needed for severe pain.         Marland Kitchen PROAIR HFA 108 (90 BASE) MCG/ACT inhaler            0     Dispense as written.   Marland Kitchen QUEtiapine (SEROQUEL) 300 MG tablet   Oral   Take 1 tablet (300 mg total) by mouth at bedtime. Patient not taking: Reported on 05/03/2015   30 tablet   0   . QUEtiapine (SEROQUEL) 50 MG tablet   Oral   Take 1 tablet (50 mg total)  by mouth daily at 12 noon.   30 tablet   0   . SEROQUEL XR 400 MG 24 hr tablet   Oral   Take 400 mg by mouth at bedtime.      0     Dispense as written.   . sucralfate (CARAFATE) 1 GM/10ML suspension   Oral   Take 1 g by mouth 4 (four) times daily.         Marland Kitchen thiamine 50 MG tablet   Oral   Take 1 tablet (50 mg total) by mouth daily.   30 tablet   0     Allergies Fluoxetine and Hydrocodone-acetaminophen  Family History  Problem Relation Age of Onset  . Stroke Father   . Leukemia Father   . Breast cancer Sister     Social History History  Substance Use Topics  . Smoking status: Current Every Day Smoker  . Smokeless tobacco: Not on file  . Alcohol Use: Yes    Review of Systems  Constitutional: No fever or chills. No weight changes Eyes:No blurry vision or double vision.  ENT: No sore throat. Cardiovascular: Chest pain as above. Respiratory: No dyspnea or cough. Gastrointestinal: Epigastric abdominal pain with vomiting as above.  No BRBPR or melena. Genitourinary: Negative for dysuria, urinary retention, bloody  urine, or difficulty urinating. Musculoskeletal: Negative for back pain. No joint swelling or pain. Skin: Negative for rash. Neurological: Negative for headaches, focal weakness or numbness. Psychiatric:No anxiety or depression.   Endocrine:No hot/cold intolerance, changes in energy, or sleep difficulty.  10-point ROS otherwise negative.  ____________________________________________   PHYSICAL EXAM:  VITAL SIGNS: ED Triage Vitals  Enc Vitals Group     BP 07/08/15 2113 154/117 mmHg     Pulse Rate 07/08/15 2113 94     Resp 07/08/15 2113 28     Temp 07/08/15 2113 97.6 F (36.4 C)     Temp Source 07/08/15 2113 Oral     SpO2 07/08/15 2113 98 %     Weight 07/08/15 2113 265 lb (120.203 kg)     Height 07/08/15 2113 6' (1.829 m)     Head Cir --      Peak Flow --      Pain Score 07/08/15 2114 7     Pain Loc --      Pain Edu? --      Excl. in [place]? --      Constitutional: Alert and oriented. Moderate distress. Eyes: No scleral icterus. No conjunctival pallor. PERRL. EOMI ENT   Head: Normocephalic and atraumatic.   Nose: No congestion/rhinnorhea. No septal hematoma   Mouth/Throat: MMM, no pharyngeal erythema. No peritonsillar mass. No uvula shift.   Neck: No stridor. No SubQ emphysema. No meningismus. Hematological/Lymphatic/Immunilogical: No cervical lymphadenopathy. Cardiovascular: RRR. Normal and symmetric distal pulses are present in all extremities. No murmurs, rubs, or gallops. Respiratory: Normal respiratory effort without tachypnea nor retractions. Breath sounds are clear and equal bilaterally. No wheezes/rales/rhonchi. Gastrointestinal: Soft and nontender. No distention. There is no CVA tenderness.  No rebound, rigidity, or guarding. Genitourinary: deferred Musculoskeletal: Nontender with normal range of motion in all extremities. No joint effusions.  No lower extremity tenderness.  No edema. Neurologic:   Normal speech and language.  CN 2-10 normal. Motor  grossly intact. No pronator drift.  Normal gait. No gross focal neurologic deficits are appreciated.  Skin:  Skin is warm, dry and intact. No rash noted.  No petechiae, purpura, or bullae. Psychiatric: Mood and affect are normal. Speech  and behavior are normal. Patient exhibits appropriate insight and judgment.  ____________________________________________    LABS (pertinent positives/negatives) (all labs ordered are listed, but only abnormal results are displayed) Labs Reviewed  CBC WITH DIFFERENTIAL/PLATELET - Abnormal; Notable for the following:    WBC 15.0 (*)    RBC 6.19 (*)    Hemoglobin 18.2 (*)    HCT 55.6 (*)    Neutro Abs 9.1 (*)    Lymphs Abs 4.5 (*)    All other components within normal limits  COMPREHENSIVE METABOLIC PANEL  LIPASE, BLOOD  TROPONIN I  URINALYSIS COMPLETEWITH MICROSCOPIC ([facility] ONLY)   ____________________________________________   EKG  Interpreted by me Wondering baseline due to persistent patient movement during EKG recording limits interpretation. However, the discernible waveforms revealed normal sinus rhythm rate of 92, normal axis and intervals, normal QRS and ST segments and T waves. Overall normal EKG.  ____________________________________________    RADIOLOGY  Chest x-ray unremarkable CT chest abdomen pelvis pending  ____________________________________________   PROCEDURES  ____________________________________________   INITIAL IMPRESSION / ASSESSMENT AND PLAN / ED COURSE  Pertinent labs & imaging results that were available during my care of the patient were reviewed by me and considered in my medical decision making (see chart for details).  Patient presents with chest back and abdomen pain. Low suspicion for dissection, and with his cancer history or think highest risk as for PE. Especially with the pleuritic nature of the pain. Abdomen is also significantly tender, and the pain onset similarly related to eating, so  highest suspicion would be for biliary or pancreatic pathology. We'll check labs for an apparent cause, and give fluids and Dilaudid for symptom relief at this time.  ----------------------------------------- 11:05 PM on 07/08/2015 -----------------------------------------  Lab workup essentially unremarkable. No evidence of ACS pancreatitis or biliary pathology. Due to the significance of the patient's pain, we'll proceed with CT of the chest to rule out PE as well as CT of the abdomen pelvis with IV contrast to further evaluate. The patient was signed out to oncoming physician Dr. Thomasene Lot who can follow up on the CTs for further disposition.  ____________________________________________   FINAL CLINICAL IMPRESSION(S) / ED DIAGNOSES  Final diagnoses:  Chest pain, unspecified chest pain type      Carrie Mew, MD 07/08/15 2306

## 2015-07-08 NOTE — ED Notes (Signed)
Pt. States he vomited 5x times today. Pt. States the color of the vomit was yellow.  Pt. States multiple loose bowel movements today.  Pt. States body aches including lt. Leg pain lt. Shoulder pain and LLQ abdominal pain after vomiting today.  Pt. Only eating half ham sandwich and some alcohol today.

## 2015-07-09 LAB — URINALYSIS COMPLETE WITH MICROSCOPIC (ARMC ONLY)
BILIRUBIN URINE: NEGATIVE
Bacteria, UA: NONE SEEN
Glucose, UA: NEGATIVE mg/dL
Hgb urine dipstick: NEGATIVE
Ketones, ur: NEGATIVE mg/dL
LEUKOCYTES UA: NEGATIVE
Nitrite: NEGATIVE
Protein, ur: NEGATIVE mg/dL
RBC / HPF: NONE SEEN RBC/hpf (ref 0–5)
SPECIFIC GRAVITY, URINE: 1.041 — AB (ref 1.005–1.030)
Squamous Epithelial / LPF: NONE SEEN
pH: 5 (ref 5.0–8.0)

## 2015-07-09 MED ORDER — METOCLOPRAMIDE HCL 5 MG/ML IJ SOLN
10.0000 mg | Freq: Once | INTRAMUSCULAR | Status: AC
  Administered 2015-07-09: 10 mg via INTRAVENOUS
  Filled 2015-07-09: qty 2

## 2015-07-09 MED ORDER — OXYCODONE-ACETAMINOPHEN 5-325 MG PO TABS: 1.0000 | ORAL_TABLET | Freq: Four times a day (QID) | ORAL | Status: DC | PRN

## 2015-07-09 MED ORDER — ONDANSETRON HCL 4 MG PO TABS: 4.0000 mg | ORAL_TABLET | Freq: Every day | ORAL | Status: DC | PRN

## 2015-07-09 MED ORDER — HYDROMORPHONE HCL 1 MG/ML IJ SOLN
0.5000 mg | Freq: Once | INTRAMUSCULAR | Status: AC
  Administered 2015-07-09: 0.5 mg via INTRAVENOUS
  Filled 2015-07-09: qty 1

## 2015-07-09 MED ORDER — OXYCODONE-ACETAMINOPHEN 5-325 MG PO TABS
1.0000 | ORAL_TABLET | Freq: Once | ORAL | Status: AC
  Administered 2015-07-09: 1 via ORAL
  Filled 2015-07-09: qty 1

## 2015-07-09 NOTE — ED Notes (Signed)
Pt states "i feel about the same as far as the pain goes, not too much better."

## 2015-07-09 NOTE — ED Provider Notes (Signed)
This patient was initially seen by Dr. Joni Fears. Please see his H&P for history of present illness and past medical history.  Dr. Joni Fears had ordered a CT scan of his pending with the past me to assume care of the patient.  The CT scan has returned with the following findings:  IMPRESSION: CT CHEST: No acute pulmonary embolism.  Bronchial wall thickening can be seen with bronchitis, less likely reactive airway disease without pneumonia. Small mediastinal and hilar lymph nodes are likely reactive.  CT ABDOMEN AND PELVIS: No acute abdominopelvic process or CT findings of acute trauma.  Status post cholecystectomy and appendectomy.   Reexamination of the patient at this time finds him better, with less nausea, but he is complaining of ongoing pain. This pain is primarily behind the left scapula. It is worse with movement and with palpation. The pain continues down his back. He reports he has pain in the buttocks down the left leg.  We will treat the patient with little more antinausea medicine (Reglan) and was some pain medication.  ----------------------------------------- 4:43 AM on 07/09/2015 -----------------------------------------  We will discharge the patient with prescription for Percocet to be used as needed for this musculoskeletal strain and discomfort he has in his left scapula and left lower back.  Diagnosis: Nausea vomiting Chest pain Back pain, sciatica  Ahmed Prima, MD 07/09/15 [phone]

## 2015-07-13 ENCOUNTER — Emergency Department
Admission: EM | Admit: 2015-07-13 | Discharge: 2015-07-14 | Disposition: A | Discharge status: Home / Self Care | Payer: Medicaid Other | Attending: Emergency Medicine | Admitting: Emergency Medicine

## 2015-07-13 ENCOUNTER — Emergency Department: Payer: Medicaid Other

## 2015-07-13 ENCOUNTER — Encounter: Payer: Self-pay | Admitting: Emergency Medicine

## 2015-07-13 DIAGNOSIS — Y9389 Activity, other specified: Secondary | ICD-10-CM | POA: Insufficient documentation

## 2015-07-13 DIAGNOSIS — Z720 Tobacco use: Secondary | ICD-10-CM | POA: Diagnosis not present

## 2015-07-13 DIAGNOSIS — S0101XA Laceration without foreign body of scalp, initial encounter: Secondary | ICD-10-CM | POA: Diagnosis not present

## 2015-07-13 DIAGNOSIS — I10 Essential (primary) hypertension: Secondary | ICD-10-CM | POA: Insufficient documentation

## 2015-07-13 DIAGNOSIS — Z7982 Long term (current) use of aspirin: Secondary | ICD-10-CM | POA: Diagnosis not present

## 2015-07-13 DIAGNOSIS — S0990XA Unspecified injury of head, initial encounter: Secondary | ICD-10-CM | POA: Insufficient documentation

## 2015-07-13 DIAGNOSIS — Y998 Other external cause status: Secondary | ICD-10-CM | POA: Insufficient documentation

## 2015-07-13 DIAGNOSIS — Z79899 Other long term (current) drug therapy: Secondary | ICD-10-CM | POA: Insufficient documentation

## 2015-07-13 DIAGNOSIS — Z23 Encounter for immunization: Secondary | ICD-10-CM | POA: Insufficient documentation

## 2015-07-13 DIAGNOSIS — F10129 Alcohol abuse with intoxication, unspecified: Secondary | ICD-10-CM | POA: Diagnosis not present

## 2015-07-13 DIAGNOSIS — S0093XA Contusion of unspecified part of head, initial encounter: Secondary | ICD-10-CM | POA: Insufficient documentation

## 2015-07-13 DIAGNOSIS — Y9289 Other specified places as the place of occurrence of the external cause: Secondary | ICD-10-CM | POA: Diagnosis not present

## 2015-07-13 DIAGNOSIS — F10920 Alcohol use, unspecified with intoxication, uncomplicated: Secondary | ICD-10-CM

## 2015-07-13 DIAGNOSIS — IMO0002 Reserved for concepts with insufficient information to code with codable children: Secondary | ICD-10-CM

## 2015-07-13 MED ORDER — ONDANSETRON HCL 4 MG/2ML IJ SOLN
4.0000 mg | Freq: Once | INTRAMUSCULAR | Status: AC
  Administered 2015-07-14: 4 mg via INTRAVENOUS
  Filled 2015-07-13: qty 2

## 2015-07-13 MED ORDER — TETANUS-DIPHTH-ACELL PERTUSSIS 5-2.5-18.5 LF-MCG/0.5 IM SUSP
0.5000 mL | INTRAMUSCULAR | Status: AC
  Administered 2015-07-13: 0.5 mL via INTRAMUSCULAR
  Filled 2015-07-13: qty 0.5

## 2015-07-13 MED ORDER — IBUPROFEN 100 MG/5ML PO SUSP
600.0000 mg | Freq: Once | ORAL | Status: AC
  Administered 2015-07-14: 600 mg via ORAL

## 2015-07-13 MED ORDER — LIDOCAINE-EPINEPHRINE-TETRACAINE (LET) SOLUTION
3.0000 mL | Freq: Once | NASAL | Status: AC
  Administered 2015-07-14: 3 mL via TOPICAL
  Filled 2015-07-13: qty 3

## 2015-07-13 MED ORDER — SODIUM CHLORIDE 0.9 % IV BOLUS (SEPSIS)
1000.0000 mL | Freq: Once | INTRAVENOUS | Status: AC
  Administered 2015-07-13: 1000 mL via INTRAVENOUS

## 2015-07-13 NOTE — ED Notes (Signed)
Patient transported to CT 

## 2015-07-13 NOTE — ED Notes (Signed)
Returned to room.  Introduced self to patient again.  Instructed patient the need and reasoning for staying in the stretcher.
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
## 2015-07-13 NOTE — ED Notes (Signed)
Making rounds and patient found kneeling beside bed with head on stretcher.    Patient denies falling.  States that he feels better in that position.

## 2015-07-14 LAB — CBC
HCT: 52.9 % — ABNORMAL HIGH (ref 40.0–52.0)
HEMOGLOBIN: 17.8 g/dL (ref 13.0–18.0)
MCH: 30.2 pg (ref 26.0–34.0)
MCHC: 33.5 g/dL (ref 32.0–36.0)
MCV: 90 fL (ref 80.0–100.0)
PLATELETS: 186 10*3/uL (ref 150–440)
RBC: 5.88 MIL/uL (ref 4.40–5.90)
RDW: 13.7 % (ref 11.5–14.5)
WBC: 10.6 10*3/uL (ref 3.8–10.6)

## 2015-07-14 LAB — COMPREHENSIVE METABOLIC PANEL
ALT: 74 U/L — AB (ref 17–63)
AST: 54 U/L — ABNORMAL HIGH (ref 15–41)
Albumin: 4.1 g/dL (ref 3.5–5.0)
Alkaline Phosphatase: 61 U/L (ref 38–126)
Anion gap: 10 (ref 5–15)
BUN: 12 mg/dL (ref 6–20)
CALCIUM: 9 mg/dL (ref 8.9–10.3)
CO2: 21 mmol/L — ABNORMAL LOW (ref 22–32)
Chloride: 107 mmol/L (ref 101–111)
Creatinine, Ser: 0.88 mg/dL (ref 0.61–1.24)
GFR calc non Af Amer: >60 mL/min (ref >60)
GLUCOSE: 118 mg/dL — AB (ref 65–99)
Potassium: 3.8 mmol/L (ref 3.5–5.1)
Sodium: 138 mmol/L (ref 135–145)
Total Bilirubin: 0.4 mg/dL (ref 0.3–1.2)
Total Protein: 7.3 g/dL (ref 6.5–8.1)

## 2015-07-14 LAB — ETHANOL: Alcohol, Ethyl (B): 192 mg/dL — ABNORMAL HIGH (ref <5)

## 2015-07-14 MED ORDER — IPRATROPIUM-ALBUTEROL 0.5-2.5 (3) MG/3ML IN SOLN
RESPIRATORY_TRACT | Status: AC
  Administered 2015-07-14: 3 mL via RESPIRATORY_TRACT
  Filled 2015-07-14: qty 3

## 2015-07-14 MED ORDER — IBUPROFEN 100 MG/5ML PO SUSP
ORAL | Status: AC
  Filled 2015-07-14: qty 30

## 2015-07-14 MED ORDER — FLUORESCEIN SODIUM 1 MG OP STRP
1.0000 | ORAL_STRIP | Freq: Once | OPHTHALMIC | Status: DC
  Filled 2015-07-14: qty 1

## 2015-07-14 MED ORDER — OXYCODONE-ACETAMINOPHEN 5-325 MG PO TABS
ORAL_TABLET | ORAL | Status: AC
  Administered 2015-07-14: 1 via ORAL
  Filled 2015-07-14: qty 1

## 2015-07-14 MED ORDER — OXYCODONE-ACETAMINOPHEN 5-325 MG PO TABS
1.0000 | ORAL_TABLET | Freq: Once | ORAL | Status: AC
  Administered 2015-07-14: 1 via ORAL

## 2015-07-14 MED ORDER — IPRATROPIUM-ALBUTEROL 0.5-2.5 (3) MG/3ML IN SOLN
3.0000 mL | Freq: Once | RESPIRATORY_TRACT | Status: AC
  Administered 2015-07-14: 3 mL via RESPIRATORY_TRACT

## 2015-07-14 NOTE — ED Provider Notes (Signed)
She is feeling better now complains of blurry vision in the left eye. Left eye looks normal pupils are round reactive and equal there is no injection that I can see there is no hyphema. The scopic exam is somewhat difficult because of the pupil being partially constricted but I do not see anything bad in the fundus either. Fluoroscopy seen was negative. Extraocular movements are normal. Discussed this with Dr. Dorita Sciara. He thinks the patient may have a little bit of traumatic iritis and will follow-up in the office on Monday with him. Patient to return if he is worse.  Nena Polio, MD 07/14/15 [phone]

## 2015-07-14 NOTE — ED Notes (Signed)
pts head lacerations cleaned, pt given orange juice, pt awake and alert in no distress
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
## 2015-07-14 NOTE — ED Notes (Signed)
Upon entrance into treatment room, pt noted sleeping soundly.

## 2015-07-14 NOTE — Discharge Instructions (Signed)
Alcohol Intoxication °Alcohol intoxication occurs when you drink enough alcohol that it affects your ability to function. It can be mild or very severe. Drinking a lot of alcohol in a short time is called binge drinking. This can be very harmful. Drinking alcohol can also be more dangerous if you are taking medicines or other drugs. Some of the effects caused by alcohol may include: °· Loss of coordination. °· Changes in mood and behavior. °· Unclear thinking. °· Trouble talking (slurred speech). °· Throwing up (vomiting). °· Confusion. °· Slowed breathing. °· Twitching and shaking (seizures). °· Loss of consciousness. °HOME CARE °· Do not drive after drinking alcohol. °· Drink enough water and fluids to keep your pee (urine) clear or pale yellow. Avoid caffeine. °· Only take medicine as told by your doctor. °GET HELP IF: °· You throw up (vomit) many times. °· You do not feel better after a few days. °· You frequently have alcohol intoxication. Your doctor can help decide if you should see a substance use treatment counselor. °GET HELP RIGHT AWAY IF: °· You become shaky when you stop drinking. °· You have twitching and shaking. °· You throw up blood. It may look bright red or like coffee grounds. °· You notice blood in your poop (bowel movements). °· You become lightheaded or pass out (faint). °MAKE SURE YOU:  °· Understand these instructions. °· Will watch your condition. °· Will get help right away if you are not doing well or get worse. °Document Released: 05/26/2008 Document Revised: 08/10/2013 Document Reviewed: 05/13/2013 °[employer]® Patient Information ©2015 [employer]. This information is not intended to replace advice given to you by your health care provider. Make sure you discuss any questions you have with your health care provider. ° °

## 2015-07-14 NOTE — ED Notes (Signed)
Pharmacy called for fluorescein strip, pyxis out

## 2015-08-13 ENCOUNTER — Emergency Department
Admission: EM | Admit: 2015-08-13 | Discharge: 2015-08-13 | Disposition: A | Discharge status: Home / Self Care | Payer: Medicaid Other | Attending: Emergency Medicine | Admitting: Emergency Medicine

## 2015-08-13 ENCOUNTER — Other Ambulatory Visit: Payer: Self-pay

## 2015-08-13 ENCOUNTER — Emergency Department: Payer: Medicaid Other

## 2015-08-13 ENCOUNTER — Encounter: Payer: Self-pay | Admitting: Emergency Medicine

## 2015-08-13 DIAGNOSIS — Z79899 Other long term (current) drug therapy: Secondary | ICD-10-CM | POA: Insufficient documentation

## 2015-08-13 DIAGNOSIS — R079 Chest pain, unspecified: Secondary | ICD-10-CM | POA: Diagnosis not present

## 2015-08-13 DIAGNOSIS — Z7982 Long term (current) use of aspirin: Secondary | ICD-10-CM | POA: Insufficient documentation

## 2015-08-13 DIAGNOSIS — Z720 Tobacco use: Secondary | ICD-10-CM | POA: Insufficient documentation

## 2015-08-13 DIAGNOSIS — I10 Essential (primary) hypertension: Secondary | ICD-10-CM | POA: Diagnosis not present

## 2015-08-13 DIAGNOSIS — M545 Low back pain: Secondary | ICD-10-CM | POA: Insufficient documentation

## 2015-08-13 DIAGNOSIS — M79605 Pain in left leg: Secondary | ICD-10-CM | POA: Insufficient documentation

## 2015-08-13 LAB — COMPREHENSIVE METABOLIC PANEL
ALT: 67 U/L — ABNORMAL HIGH (ref 17–63)
ANION GAP: 10 (ref 5–15)
AST: 52 U/L — ABNORMAL HIGH (ref 15–41)
Albumin: 4.1 g/dL (ref 3.5–5.0)
Alkaline Phosphatase: 64 U/L (ref 38–126)
BILIRUBIN TOTAL: 0.4 mg/dL (ref 0.3–1.2)
BUN: 13 mg/dL (ref 6–20)
CALCIUM: 10.9 mg/dL — AB (ref 8.9–10.3)
CO2: 26 mmol/L (ref 22–32)
Chloride: 102 mmol/L (ref 101–111)
Creatinine, Ser: 0.96 mg/dL (ref 0.61–1.24)
GFR calc Af Amer: >60 mL/min (ref >60)
Glucose, Bld: 117 mg/dL — ABNORMAL HIGH (ref 65–99)
POTASSIUM: 4.2 mmol/L (ref 3.5–5.1)
Sodium: 138 mmol/L (ref 135–145)
TOTAL PROTEIN: 7.2 g/dL (ref 6.5–8.1)

## 2015-08-13 LAB — CBC
HCT: 52.6 % — ABNORMAL HIGH (ref 40.0–52.0)
Hemoglobin: 17.5 g/dL (ref 13.0–18.0)
MCH: 29.6 pg (ref 26.0–34.0)
MCHC: 33.2 g/dL (ref 32.0–36.0)
MCV: 89.1 fL (ref 80.0–100.0)
Platelets: 189 10*3/uL (ref 150–440)
RBC: 5.9 MIL/uL (ref 4.40–5.90)
RDW: 13.4 % (ref 11.5–14.5)
WBC: 13 10*3/uL — ABNORMAL HIGH (ref 3.8–10.6)

## 2015-08-13 LAB — TROPONIN I: Troponin I: <0.03 ng/mL (ref <0.031)

## 2015-08-13 MED ORDER — GABAPENTIN 100 MG PO CAPS: 100.0000 mg | ORAL_CAPSULE | Freq: Three times a day (TID) | ORAL | Status: DC

## 2015-08-13 NOTE — ED Provider Notes (Signed)
[facility] Emergency Department Provider Note    ____________________________________________  Time seen: On EMS arrival  I have reviewed the triage vital signs and the nursing notes.   HISTORY  Chief Complaint Chest Pain left leg pain  History limited by: Not Limited   HPI Ian [name] is a 54 y.o. male who presents to the emergency department today because of concerns for left chest, left back and left leg pain. His main concerns for his left leg pain. He states he's been having left leg pain for a long time. He describes it as starting in his left lower back going down his leg. He also states he's been having some left chest pain. He admits to drinking some alcohol tonight. Denies any fevers, shortness of breath.     Past Medical History  Diagnosis Date  . Cancer   . Hypertension   . Depression   . Anxiety   . Coronary artery disease   . GERD (gastroesophageal reflux disease)   . Barrett esophagus     Patient Active Problem List   Diagnosis Date Noted  . Acute left-sided weakness 05/03/2015  . CVA (cerebral infarction) 05/03/2015  . Weakness 11/16/2013  . Chest pain 11/16/2013  . HTN (hypertension) 11/16/2013  . Left-sided weakness 11/16/2013  . 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 Hemiplegia, unspecified, affecting nondominant side 11/15/2013  . Speech and language deficits 11/15/2013    Past Surgical History  Procedure Laterality Date  . Appendectomy    . Cholecystectomy    . Angioplasty    . Cardiac catheterization      Current Outpatient Rx  Name  Route  Sig  Dispense  Refill  . aspirin EC 81 MG EC tablet   Oral   Take 1 tablet (81 mg total) by mouth daily.   30 tablet   0   . citalopram (CELEXA) 20 MG tablet   Oral   Take 20 mg by mouth at bedtime.      0   . clonazePAM (KLONOPIN) 0.5 MG tablet   Oral   Take 0.5 mg by mouth at bedtime.       0   . clonazePAM (KLONOPIN) 0.5 MG tablet   Oral   Take 1 mg by  mouth 2 (two) times daily as needed for anxiety.         . hydrOXYzine (ATARAX/VISTARIL) 50 MG tablet   Oral   Take 1 tablet by mouth 2 (two) times daily as needed.      0   . lisinopril (PRINIVIL,ZESTRIL) 10 MG tablet   Oral   Take 10 mg by mouth daily.      0   . omeprazole (PRILOSEC) 40 MG capsule   Oral   Take 40 mg by mouth 2 (two) times daily.         . ondansetron (ZOFRAN) 4 MG tablet   Oral   Take 1 tablet (4 mg total) by mouth daily as needed for nausea or vomiting.   10 tablet   0   . oxyCODONE-acetaminophen (PERCOCET/ROXICET) 5-325 MG per tablet   Oral   Take 1 tablet by mouth every 4 (four) hours as needed for severe pain.         Marland Kitchen oxyCODONE-acetaminophen (ROXICET) 5-325 MG per tablet   Oral   Take 1 tablet by mouth every 6 (six) hours as needed.   12 tablet   0   . PROAIR HFA 108 (90 BASE) MCG/ACT inhaler  0     Dispense as written.   Marland Kitchen QUEtiapine (SEROQUEL) 300 MG tablet   Oral   Take 1 tablet (300 mg total) by mouth at bedtime.   30 tablet   0   . QUEtiapine (SEROQUEL) 50 MG tablet   Oral   Take 1 tablet (50 mg total) by mouth daily at 12 noon.   30 tablet   0   . SEROQUEL XR 400 MG 24 hr tablet   Oral   Take 400 mg by mouth at bedtime.      0     Dispense as written.   . sucralfate (CARAFATE) 1 GM/10ML suspension   Oral   Take 1 g by mouth 4 (four) times daily.         Marland Kitchen thiamine 50 MG tablet   Oral   Take 1 tablet (50 mg total) by mouth daily.   30 tablet   0     Allergies Fluoxetine and Hydrocodone-acetaminophen  Family History  Problem Relation Age of Onset  . Stroke Father   . Leukemia Father   . Breast cancer Sister     Social History Social History  Substance Use Topics  . Smoking status: Current Every Day Smoker -- 1.00 packs/day    Types: Cigarettes  . Smokeless tobacco: None  . Alcohol Use: Yes    Review of Systems  Constitutional: Negative for fever. Cardiovascular: Positive  for chest pain. Respiratory: Negative for shortness of breath. Gastrointestinal: Negative for abdominal pain, vomiting and diarrhea. Genitourinary: Negative for dysuria. Musculoskeletal: Positive for back pain. Positive for left leg. Skin: Negative for rash. Neurological: Negative for headaches, focal weakness or numbness.  10-point ROS otherwise negative.  ____________________________________________   PHYSICAL EXAM:  VITAL SIGNS: ED Triage Vitals  Enc Vitals Group     BP 08/13/15 2150 113/89 mmHg     Pulse Rate 08/13/15 2150 102     Resp 08/13/15 2150 25     Temp 08/13/15 2150 97.5 F (36.4 C)     Temp Source 08/13/15 2150 Oral     SpO2 08/13/15 2150 95 %     Weight 08/13/15 2150 265 lb (120.203 kg)     Height 08/13/15 2150 6\' 1"  (1.854 m)     Head Cir --      Peak Flow --      Pain Score 08/13/15 2150 10   Constitutional: Alert and oriented. Well appearing and in no distress. Eyes: Conjunctivae are normal. PERRL. Normal extraocular movements. ENT   Head: Normocephalic and atraumatic.   Nose: No congestion/rhinnorhea.   Mouth/Throat: Mucous membranes are moist.   Neck: No stridor. Hematological/Lymphatic/Immunilogical: No cervical lymphadenopathy. Cardiovascular: Normal rate, regular rhythm.  No murmurs, rubs, or gallops. Respiratory: Normal respiratory effort without tachypnea nor retractions. Breath sounds are clear and equal bilaterally. No wheezes/rales/rhonchi. Gastrointestinal: Soft and nontender. No distention.  Genitourinary: Deferred Musculoskeletal: Normal range of motion in all extremities. No joint effusions.  No lower extremity tenderness nor edema. Neurologic:  Normal speech and language. No gross focal neurologic deficits are appreciated. Speech is normal.  Skin:  Skin is warm, dry and intact. No rash noted. Psychiatric: Mood and affect are normal. Speech and behavior are normal. Patient exhibits appropriate insight and  judgment.  ____________________________________________    LABS (pertinent positives/negatives)  Labs Reviewed  CBC - Abnormal; Notable for the following:    WBC 13.0 (*)    HCT 52.6 (*)    All other components within normal limits  COMPREHENSIVE METABOLIC PANEL - Abnormal; Notable for the following:    Glucose, Bld 117 (*)    Calcium 10.9 (*)    AST 52 (*)    ALT 67 (*)    All other components within normal limits  TROPONIN I     ____________________________________________   EKG  I, Nance Pear, attending physician, personally viewed and interpreted this EKG  EKG Time: 2153 Rate: 98 Rhythm: NSR Axis: normal Intervals: qtc 441 QRS: narrow ST changes: no st elevation  ____________________________________________    RADIOLOGY  CXR IMPRESSION: No active cardiopulmonary disease.  ____________________________________________   PROCEDURES  Procedure(s) performed: None  Critical Care performed: No  ____________________________________________   INITIAL IMPRESSION / ASSESSMENT AND PLAN / ED COURSE  Pertinent labs & imaging results that were available during my care of the patient were reviewed by me and considered in my medical decision making (see chart for details).  Patient presented to the emergency department today because of concerns primarily for left leg pain. His also had some left chest pain. He also has complaints of back pain. He does admit to drinking some alcohol tonight. EKG without any concerning findings. Blood work and chest x-ray without any concerning findings save for mild leukocytosis. Did sound like patient might be suffering from sciatica. Will try patient with gabapentin. Encourage primary care follow-up.  ____________________________________________   FINAL CLINICAL IMPRESSION(S) / ED DIAGNOSES  Left leg pain  Nance Pear, MD 08/13/15 2316

## 2015-08-13 NOTE — ED Notes (Addendum)
Pt presents to ED via EMS from personal home with c/o of left side chest pain radiating to mid-back and left leg. EMS states pt has a hx of hepatitis C and anxiety, with ETOH consumption this evening. EMS states pt has no cardiac hx at this time. EMS states pt experienced these presenting sx since approx. 3 hrs ago. Pt arrived to ER able to follow verbal and physical commands appropriately.

## 2015-08-13 NOTE — ED Notes (Signed)
MD at bedside, updating pt on current plan of care and discharge disposition.

## 2015-08-13 NOTE — ED Notes (Signed)
Patient back from  X-ray 

## 2015-08-13 NOTE — ED Notes (Signed)
Patient transported to X-ray 

## 2015-08-13 NOTE — ED Notes (Signed)
Patient with no complaints at this time. Respirations even and unlabored. Skin warm/dry. Discharge instructions reviewed with patient at this time. Patient given opportunity to voice concerns/ask questions. IV removed per policy and band-aid applied to site. Patient discharged at this time and left Emergency Department with steady gait.  

## 2015-08-13 NOTE — Discharge Instructions (Signed)
Please seek medical attention for any high fevers, chest pain, shortness of breath, change in behavior, persistent vomiting, bloody stool or any other new or concerning symptoms.  Sciatica Sciatica is pain, weakness, numbness, or tingling along the path of the sciatic nerve. The nerve starts in the lower back and runs down the back of each leg. The nerve controls the muscles in the lower leg and in the back of the knee, while also providing sensation to the back of the thigh, lower leg, and the sole of your foot. Sciatica is a symptom of another medical condition. For instance, nerve damage or certain conditions, such as a herniated disk or bone spur on the spine, pinch or put pressure on the sciatic nerve. This causes the pain, weakness, or other sensations normally associated with sciatica. Generally, sciatica only affects one side of the body. CAUSES   Herniated or slipped disc.  Degenerative disk disease.  A pain disorder involving the narrow muscle in the buttocks (piriformis syndrome).  Pelvic injury or fracture.  Pregnancy.  Tumor (rare). SYMPTOMS  Symptoms can vary from mild to very severe. The symptoms usually travel from the low back to the buttocks and down the back of the leg. Symptoms can include:  Mild tingling or dull aches in the lower back, leg, or hip.  Numbness in the back of the calf or sole of the foot.  Burning sensations in the lower back, leg, or hip.  Sharp pains in the lower back, leg, or hip.  Leg weakness.  Severe back pain inhibiting movement. These symptoms may get worse with coughing, sneezing, laughing, or prolonged sitting or standing. Also, being overweight may worsen symptoms. DIAGNOSIS  Your caregiver will perform a physical exam to look for common symptoms of sciatica. He or she may ask you to do certain movements or activities that would trigger sciatic nerve pain. Other tests may be performed to find the cause of the sciatica. These may  include:  Blood tests.  X-rays.  Imaging tests, such as an MRI or CT scan. TREATMENT  Treatment is directed at the cause of the sciatic pain. Sometimes, treatment is not necessary and the pain and discomfort goes away on its own. If treatment is needed, your caregiver may suggest:  Over-the-counter medicines to relieve pain.  Prescription medicines, such as anti-inflammatory medicine, muscle relaxants, or narcotics.  Applying heat or ice to the painful area.  Steroid injections to lessen pain, irritation, and inflammation around the nerve.  Reducing activity during periods of pain.  Exercising and stretching to strengthen your abdomen and improve flexibility of your spine. Your caregiver may suggest losing weight if the extra weight makes the back pain worse.  Physical therapy.  Surgery to eliminate what is pressing or pinching the nerve, such as a bone spur or part of a herniated disk. HOME CARE INSTRUCTIONS   Only take over-the-counter or prescription medicines for pain or discomfort as directed by your caregiver.  Apply ice to the affected area for 20 minutes, 3-4 times a day for the first 48-72 hours. Then try heat in the same way.  Exercise, stretch, or perform your usual activities if these do not aggravate your pain.  Attend physical therapy sessions as directed by your caregiver.  Keep all follow-up appointments as directed by your caregiver.  Do not wear high heels or shoes that do not provide proper support.  Check your mattress to see if it is too soft. A firm mattress may lessen your pain and  discomfort. SEEK IMMEDIATE MEDICAL CARE IF:   You lose control of your bowel or bladder (incontinence).  You have increasing weakness in the lower back, pelvis, buttocks, or legs.  You have redness or swelling of your back.  You have a burning sensation when you urinate.  You have pain that gets worse when you lie down or awakens you at night.  Your pain is worse  than you have experienced in the past.  Your pain is lasting longer than 4 weeks.  You are suddenly losing weight without reason. MAKE SURE YOU:  Understand these instructions.  Will watch your condition.  Will get help right away if you are not doing well or get worse. Document Released: 12/02/2001 Document Revised: 06/08/2012 Document Reviewed: 04/18/2012 [facility] Patient Information 2015 [place], [place]. This information is not intended to replace advice given to you by your health care provider. Make sure you discuss any questions you have with your health care provider.

## 2015-09-29 IMAGING — CR DG CHEST 1V PORT
1 series · 1 of 1 positions shown · non-contrast
Comparison: 07/08/2014

CLINICAL DATA: Chest tightness, shortness of breath, and cough.
Smoker.

EXAM:
PORTABLE CHEST - 1 VIEW

[dxr portable chest single view]
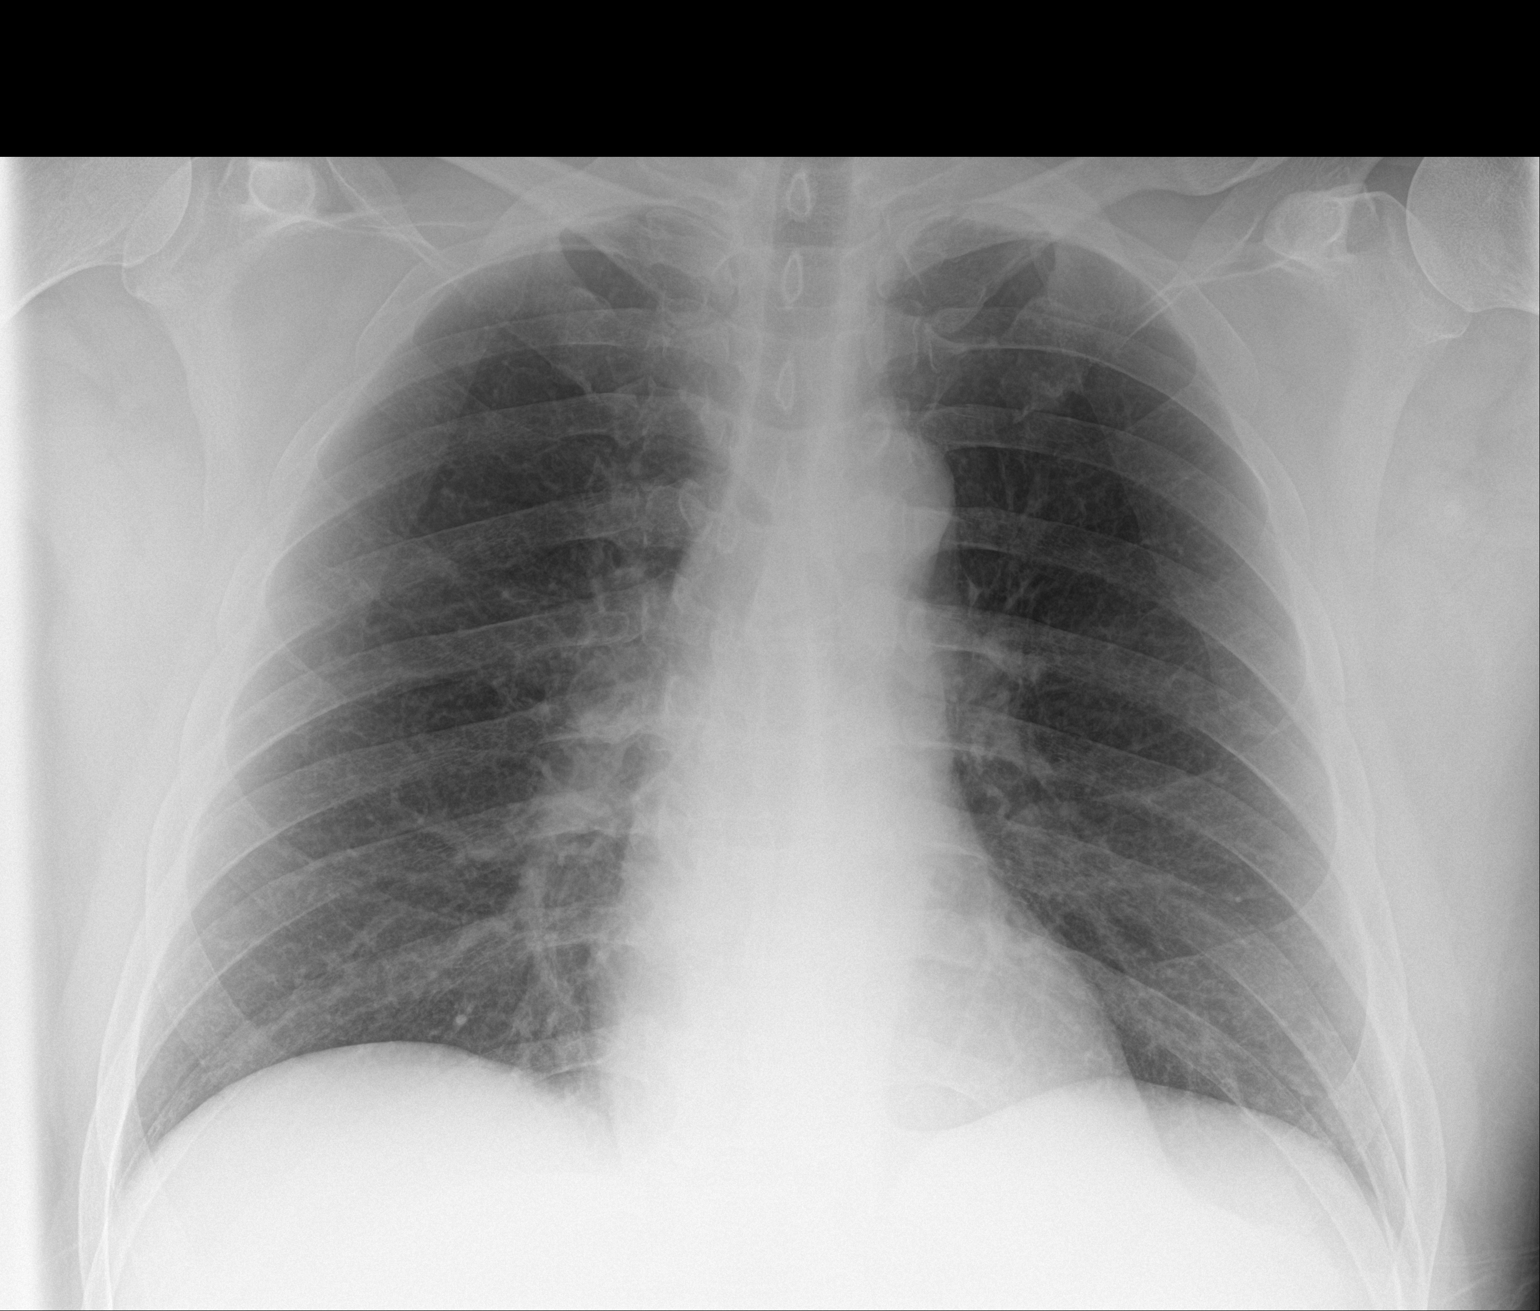

[1 of 1 positions shown; findings below may reference images not displayed]

FINDINGS: Emphysematous changes in the lungs with central interstitial pattern
consistent with chronic bronchitis. The heart size and mediastinal
contours are within normal limits. Both lungs are clear. The
visualized skeletal structures are unremarkable.
IMPRESSION: Emphysematous changes and chronic bronchitic changes in the chest.
No evidence of active pulmonary disease.

## 2015-09-29 IMAGING — CT CT CERVICAL SPINE WITHOUT CONTRAST
3 of 5 series · 10 of 33 positions shown, 11 images · non-contrast
Comparison: Head CT 07/08/2014 and CT cervical spine 09/15/2012

CLINICAL DATA: Fall, pt doesn't recall event, abrasion to forehead,
c/o head and back pain, hx of throat, esophageal and prostate ca per
pt. record

EXAM:
CT HEAD WITHOUT CONTRAST
CT CERVICAL SPINE WITHOUT CONTRAST
TECHNIQUE: Multidetector CT imaging of the head and cervical spine was
performed following the standard protocol without intravenous
contrast. Multiplanar CT image reconstructions of the cervical spine
were also generated.

[Series 8: sag bone · sagittal · 0.22mm/px · 5 of 54 slices shown]
[im 18/54  bone]
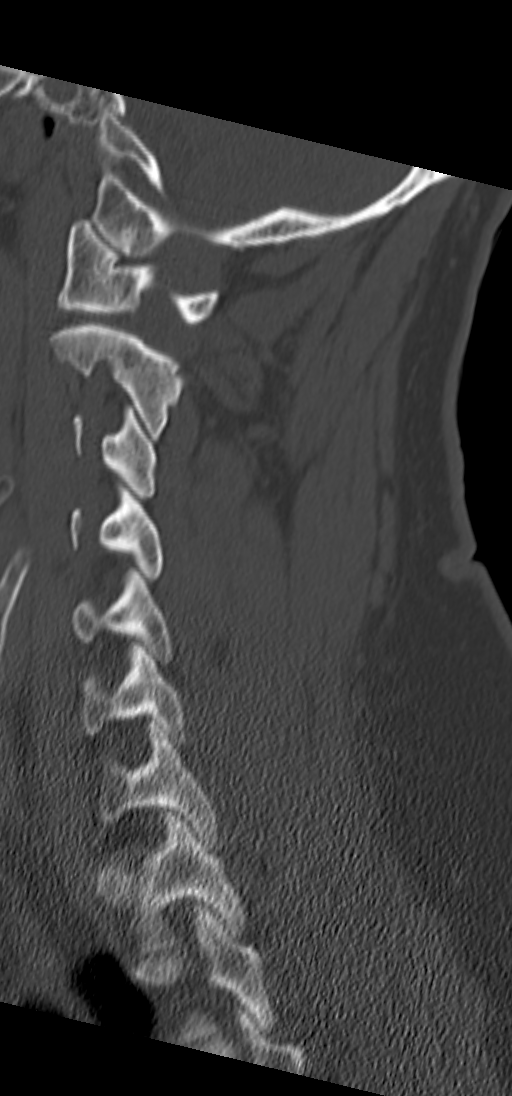
[im 23/54  bone]
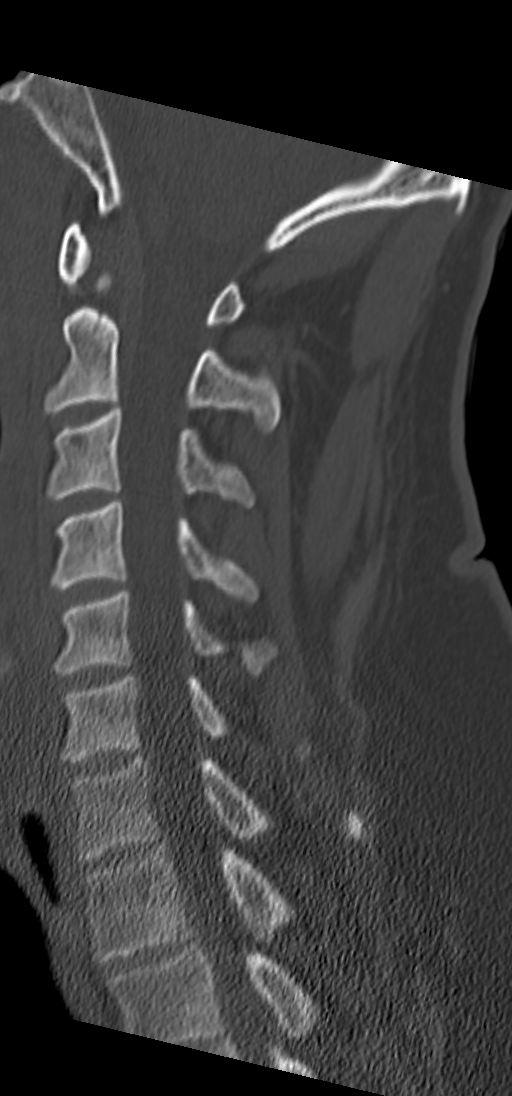
[im 27/54  bone]
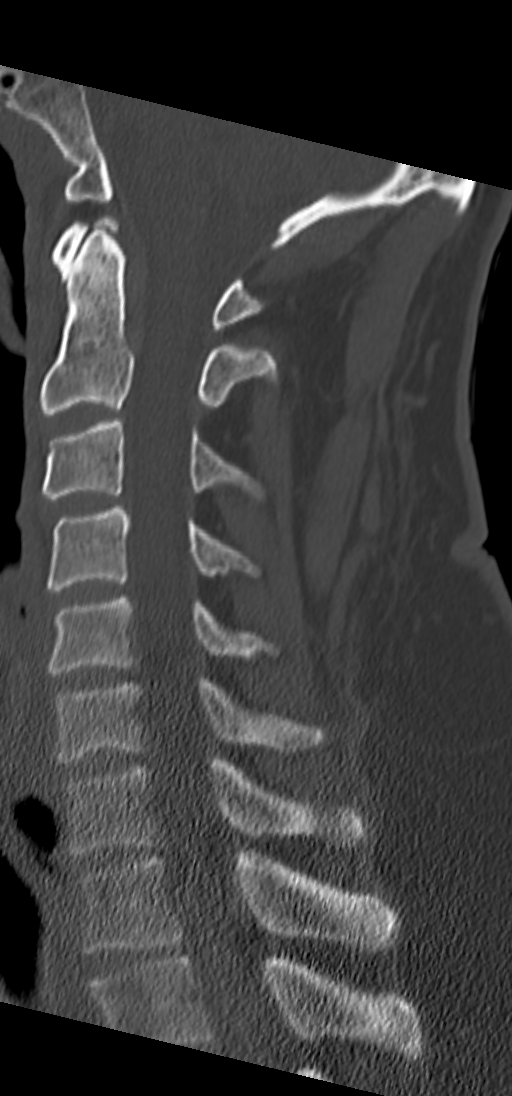
[im 31/54  bone]
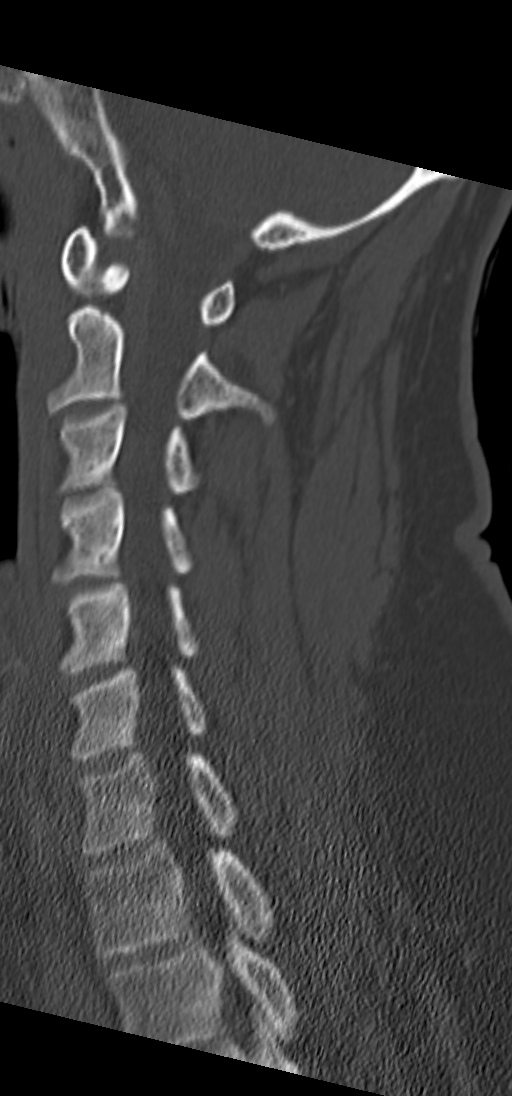
[im 36/54  bone]
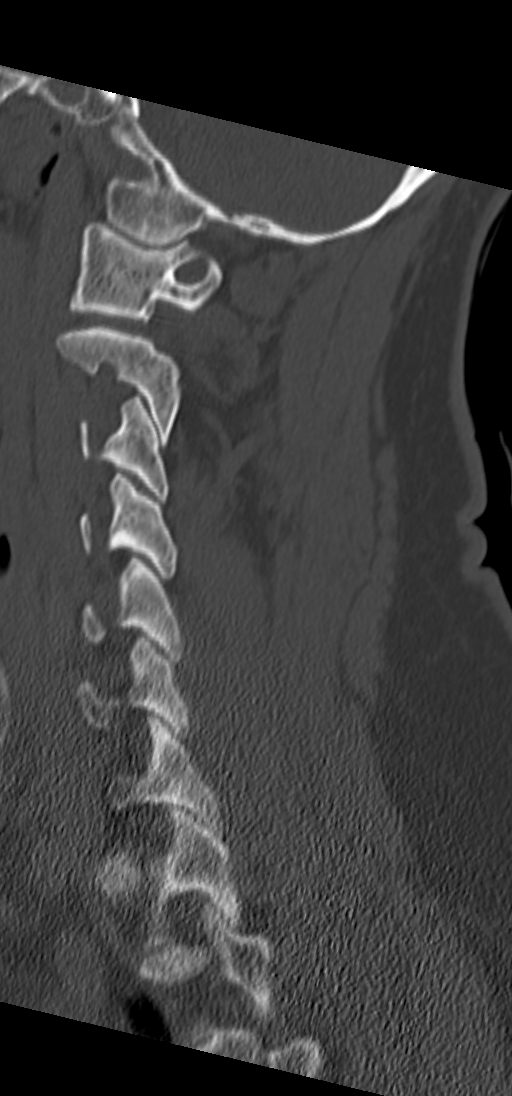

[Series 9: cor bone · coronal · 0.26mm/px · 3 of 51 slices shown]
[im 11/51  bone]
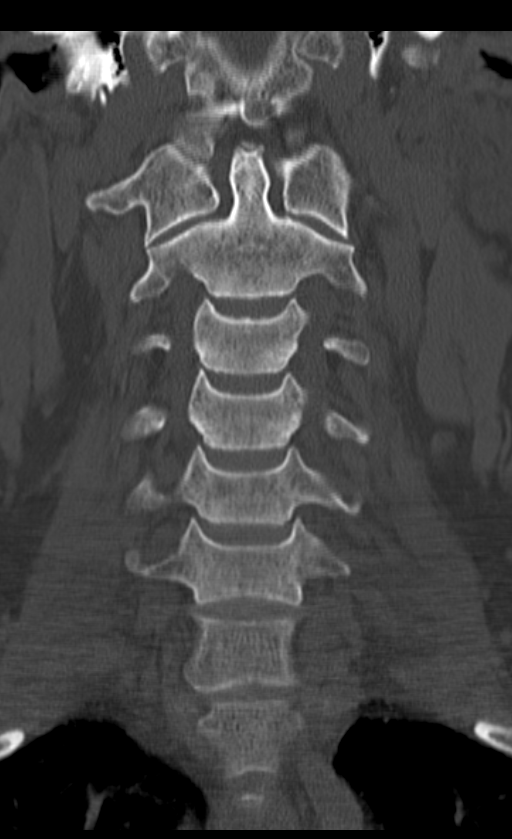
[im 21/51  bone]
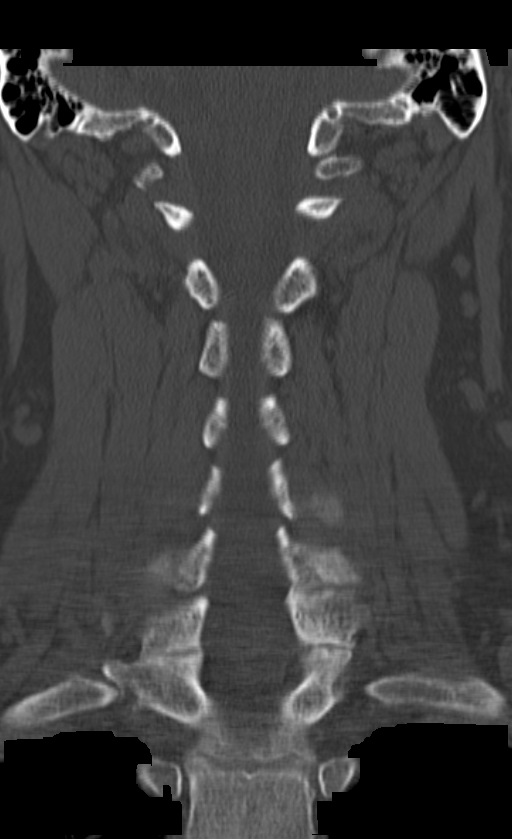
[im 31/51  bone]
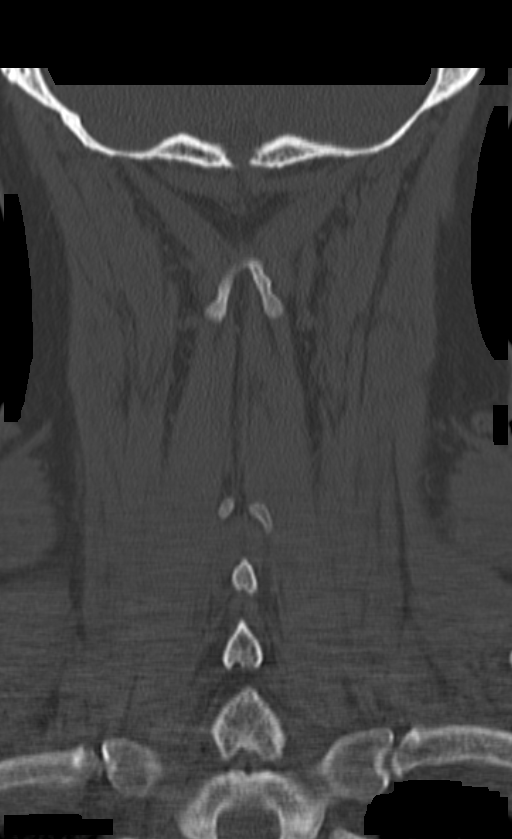

[Series 10: orthogonal axials · axial · 0.20mm/px · z∈[-266,-188]mm · 2 of 104 slices shown, 3 images]
[im 42/104  soft-tissue]
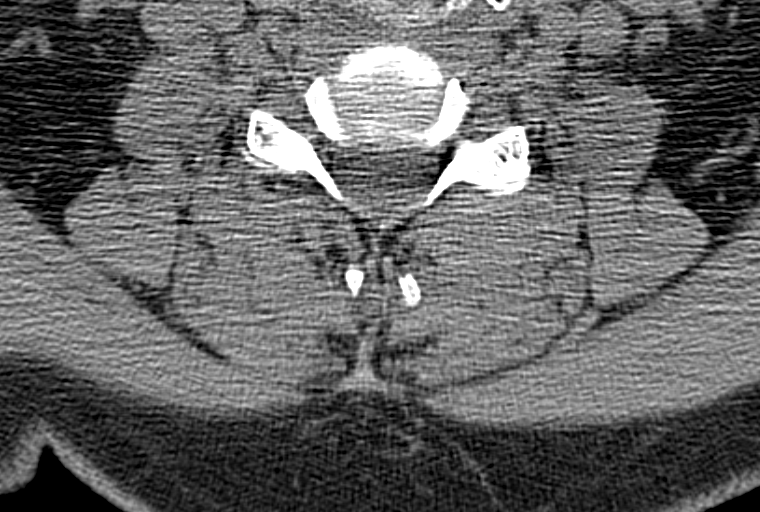
[im 42/104  bone]
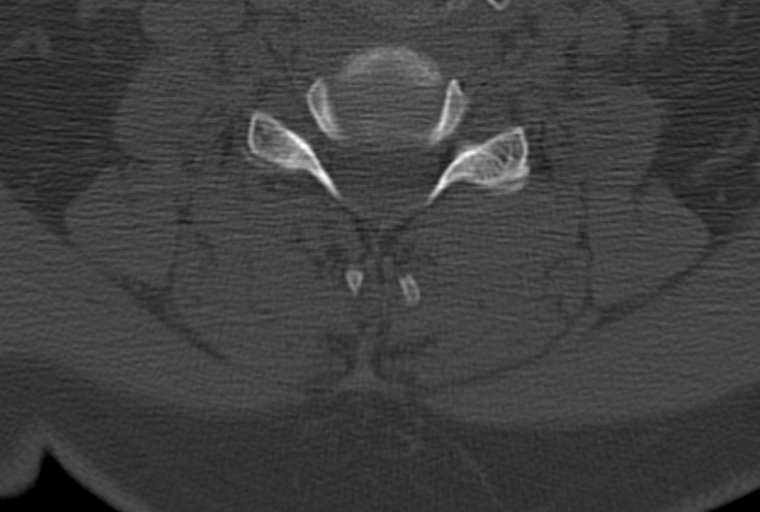
[im 83/104  bone]
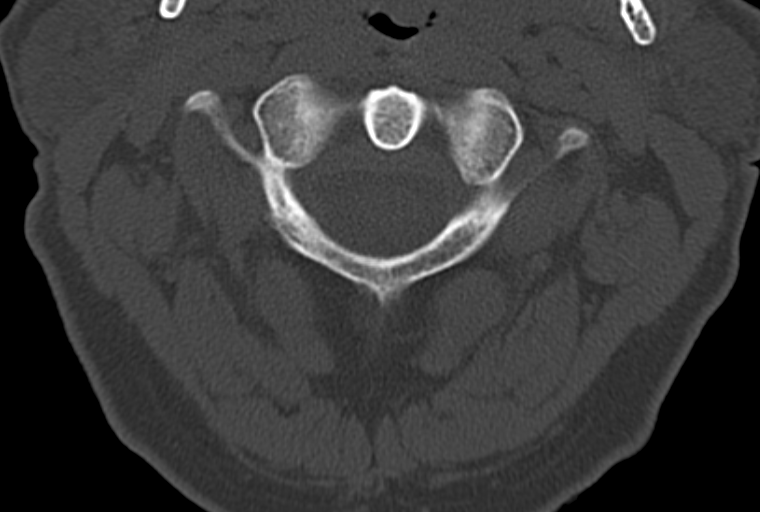

[10 of 33 positions shown; findings below may reference images not displayed]

FINDINGS: CT HEAD FINDINGS

Ventricles, cisterns and other CSF spaces are within normal. There
is no mass, mass effect, shift of midline structures or acute
hemorrhage. No evidence to suggest acute infarction. Bony structures
are within normal. Mucous retention cyst over the right maxillary
sinus.

CT CERVICAL SPINE FINDINGS

Vertebral body alignment, heights and disc space heights are normal.
There is very minimal spondylosis present. Prevertebral soft tissues
as well as the atlantoaxial articulation are normal. There is no
acute fracture or subluxation. Remaining bones and soft tissues are
unremarkable. Lung apices are within normal.
IMPRESSION: No acute intracranial findings.

No acute cervical spine injury.

Very minimal spondylosis of the cervical spine.

Mucous retention cyst right maxillary sinus.

## 2015-09-29 IMAGING — CR DG LUMBAR SPINE 2-3V
1 series · 3 of 3 positions shown · non-contrast
Comparison: 02/08/2014

CLINICAL DATA: Pain after falling.  Does not remember cause.

EXAM:
LUMBAR SPINE - 2-3 VIEW

[Series 1: dxr lumbar spine ap and lateral · 0.14mm/px · 3 of 3 slices shown]
[im 1/3]
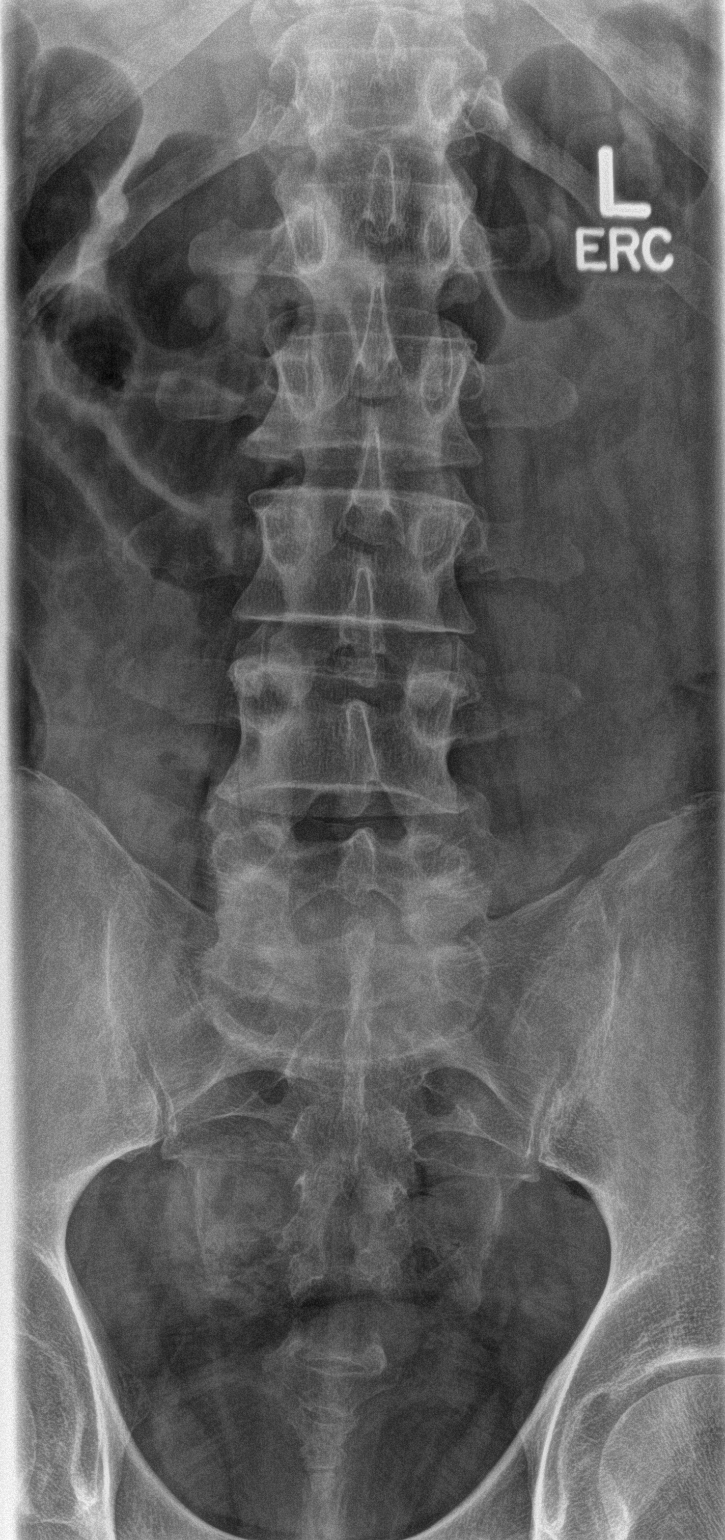
[im 2/3]
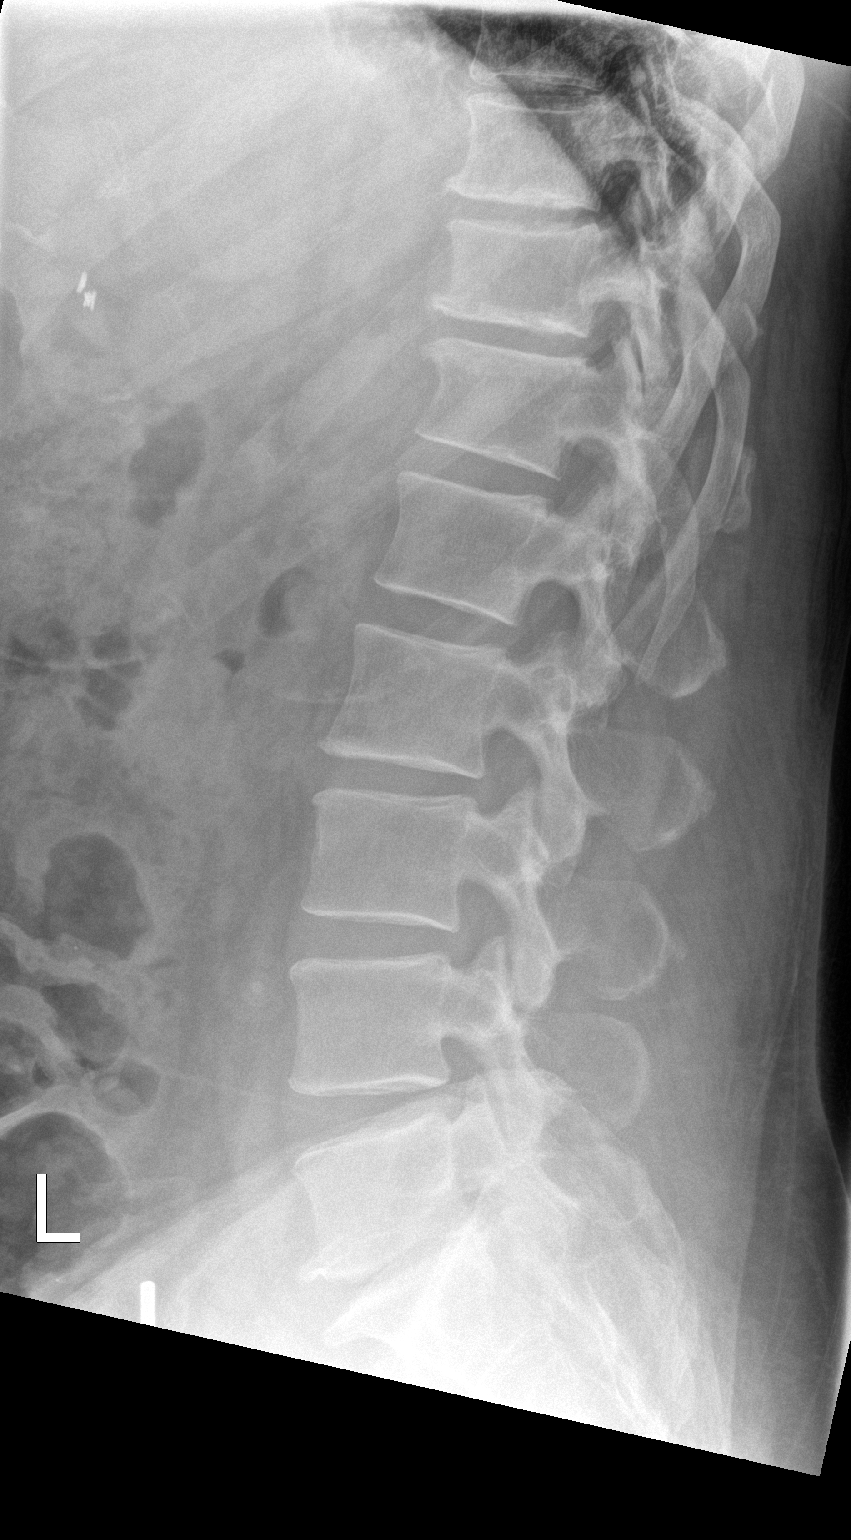
[im 3/3]
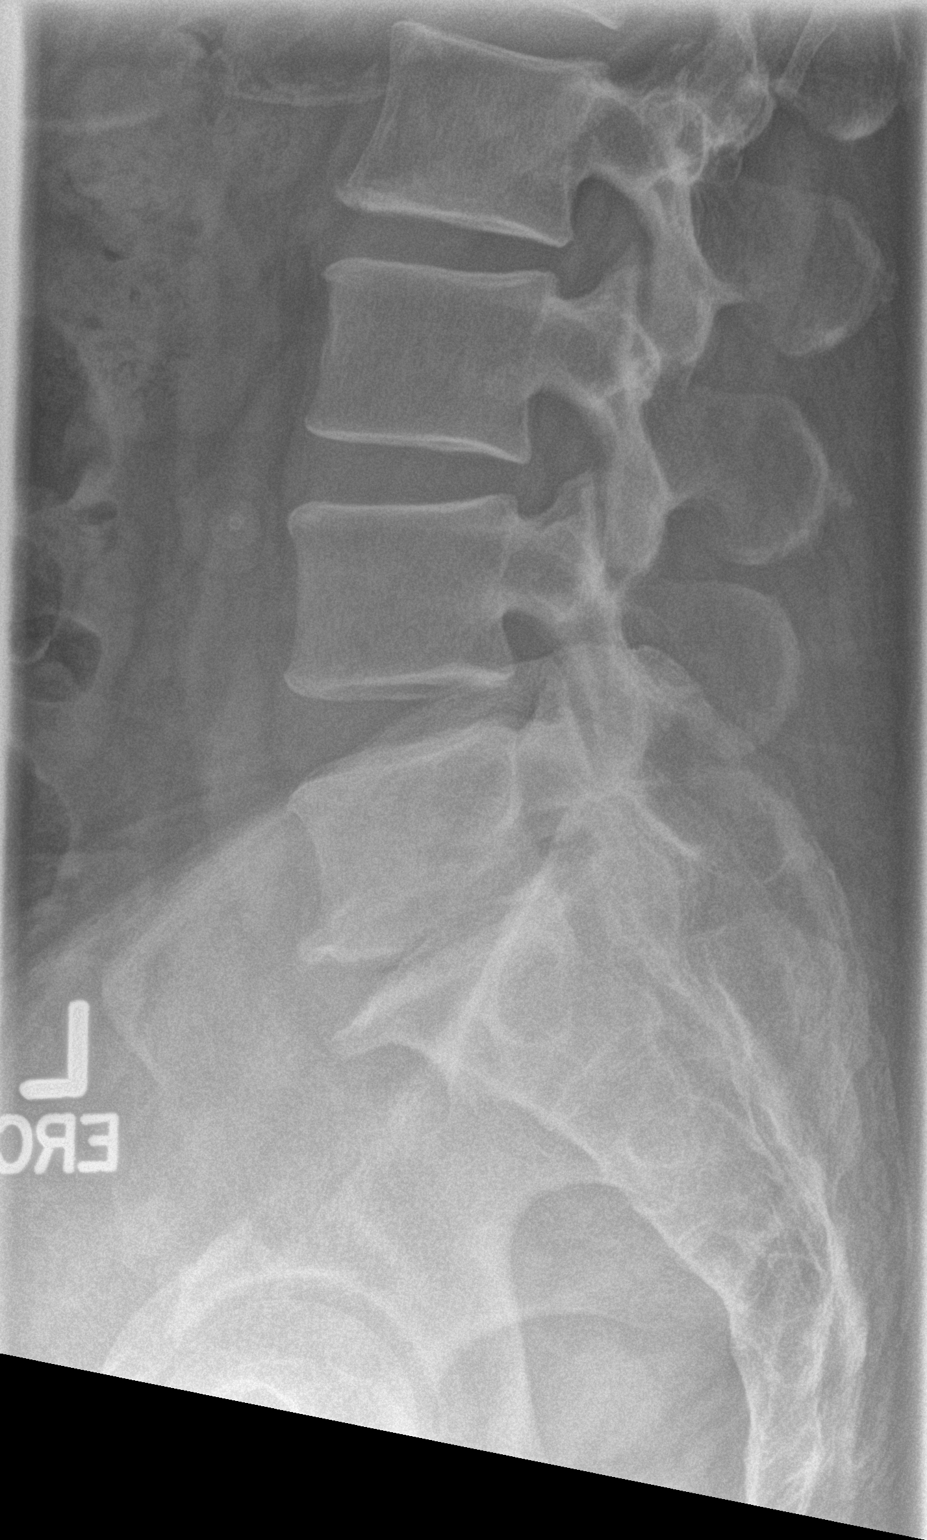

[3 of 3 positions shown; findings below may reference images not displayed]

FINDINGS: With degenerative changes in the lower thoracic and lumbar spine
with narrowed lumbar interspaces and endplate hypertrophic changes.
Degenerative changes most prominent at L5-S1. Probable degenerative
disc disease at the lumbosacral interspace. No vertebral compression
deformities. No focal bone lesion or bone destruction. Bone cortex
and trabecular architecture appear intact. No significant change
since prior study.
IMPRESSION: Degenerative changes.  No displaced fractures identified.

## 2015-09-29 IMAGING — CR DG THORACIC SPINE 2-3V
1 series · 5 of 5 positions shown · non-contrast
Comparison: Chest x-ray 12/11/2013 and lumbar spine 02/08/2014

CLINICAL DATA: Pain after fall.

EXAM:
THORACIC SPINE - 2 VIEW

[Series 1: dxr thoracic  ap and lateral · 0.14mm/px · 5 of 5 slices shown]
[im 1/5]
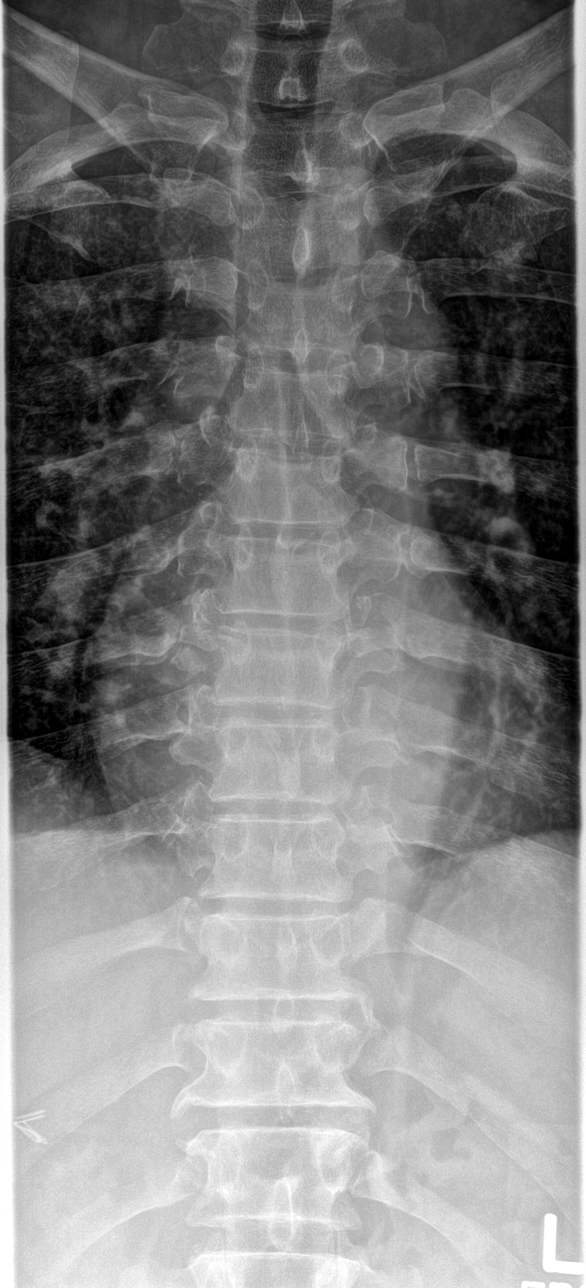
[im 2/5]
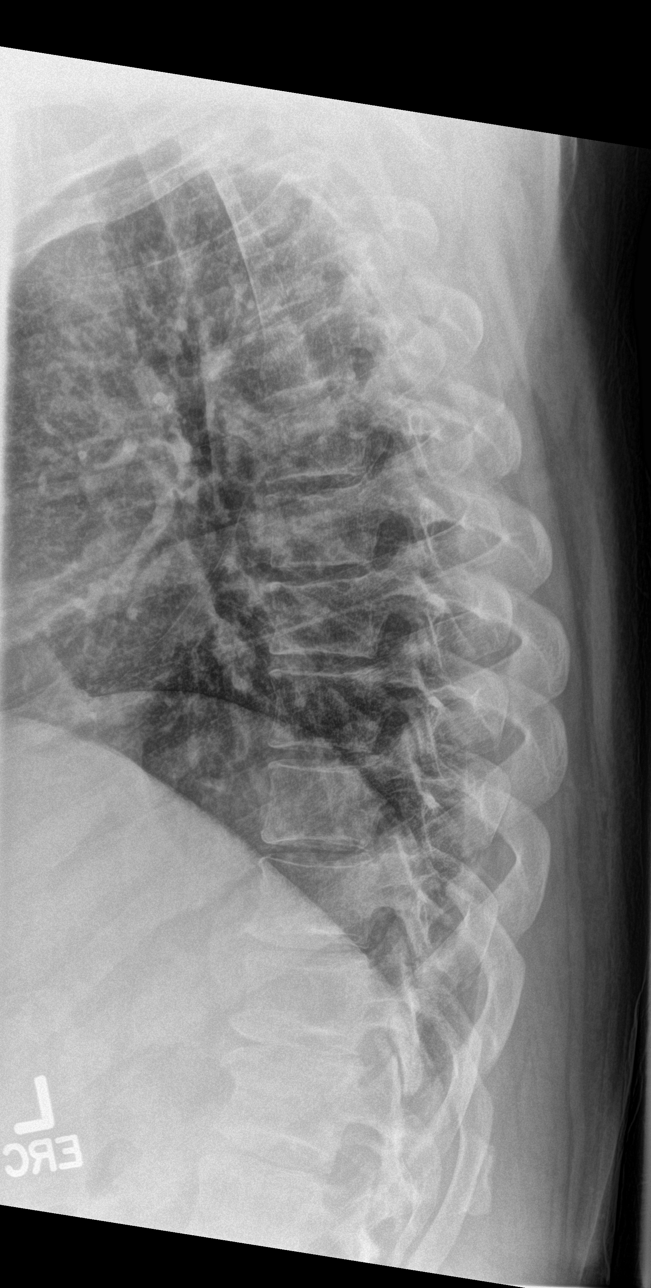
[im 3/5]
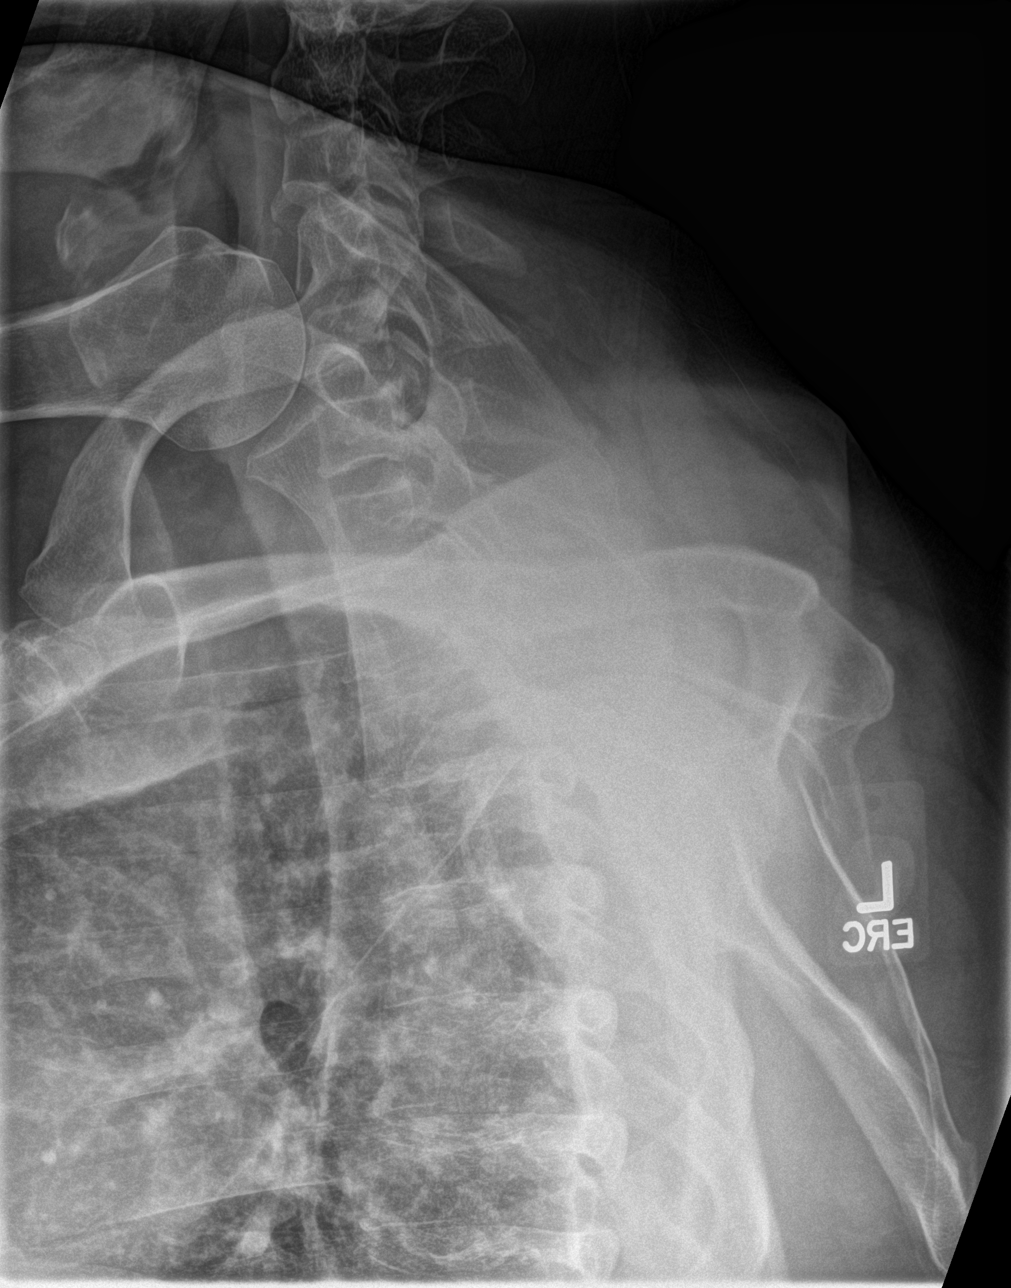
[im 4/5]
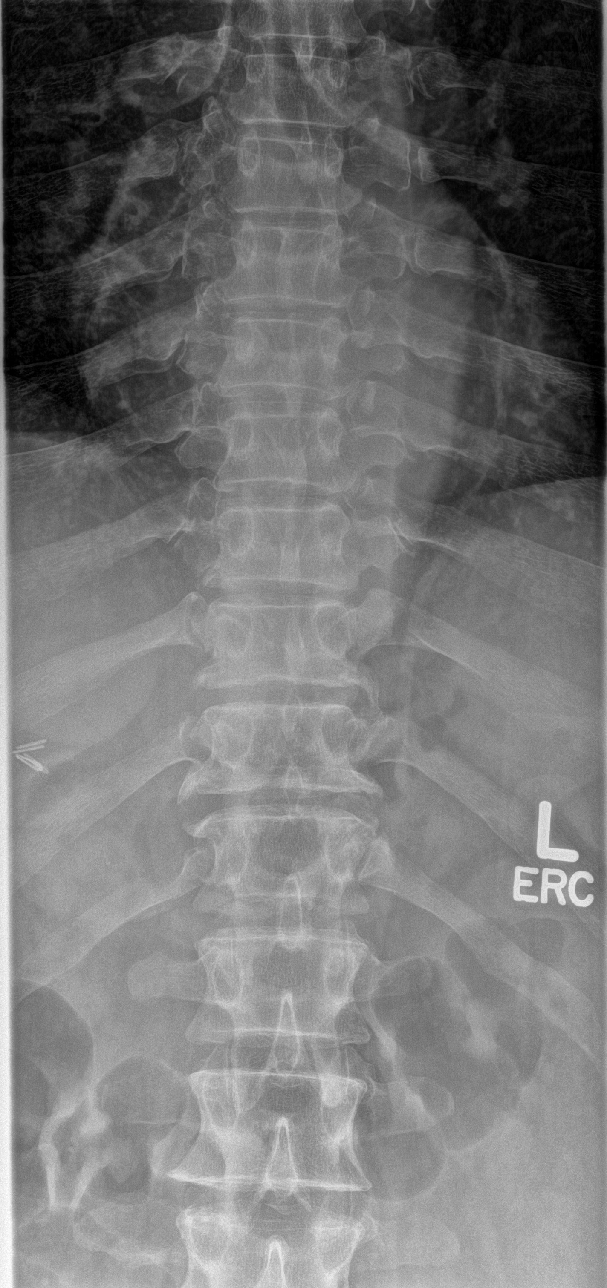
[im 5/5]
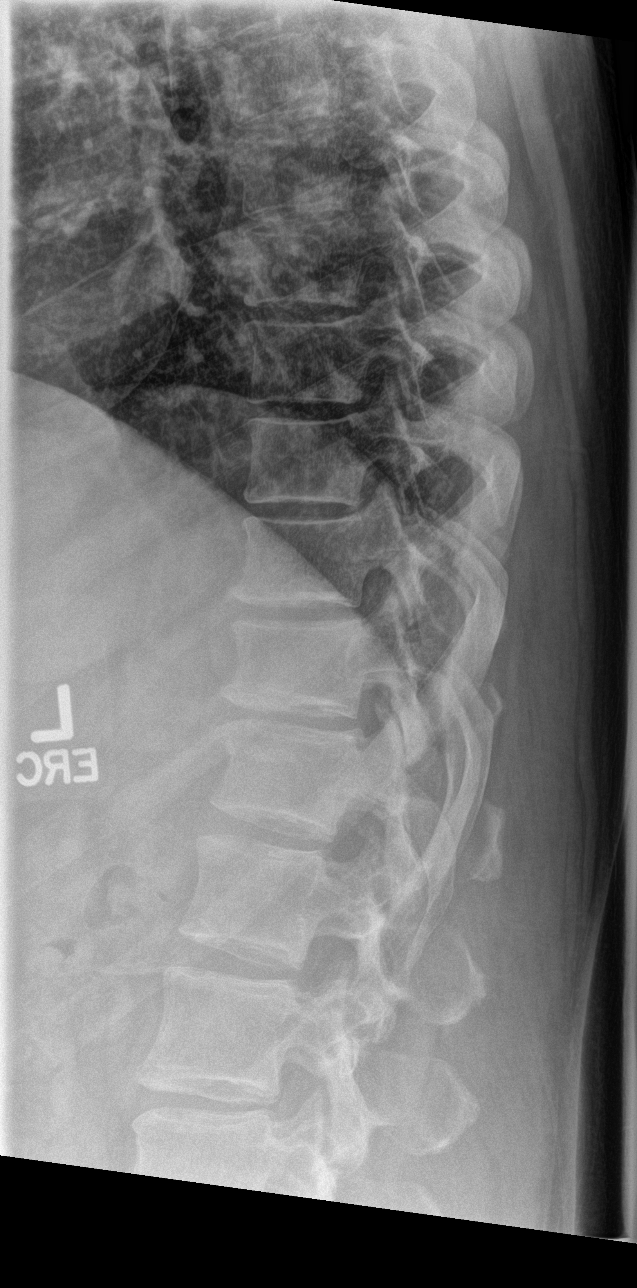

[5 of 5 positions shown; findings below may reference images not displayed]

FINDINGS: Vertebral body alignment is within normal. There is mild spondylosis
throughout the thoracic spine. Disc space narrowing at T11-12 level
unchanged. No acute compression fracture or subluxation. Pedicles
are intact.
IMPRESSION: No acute findings.

## 2015-12-23 IMAGING — CR DG CHEST 2V
1 series · 2 of 2 positions shown · non-contrast
Comparison: 12/02/2014 and prior chest radiographs dating back to
10/30/2012.

CLINICAL DATA: Cough, sweating and chills for 2 months.

EXAM:
CHEST  2 VIEW

[Series 1: dxr chest pa (or ap) and lateral · 0.14mm/px · 2 of 2 slices shown]
[im 1/2]
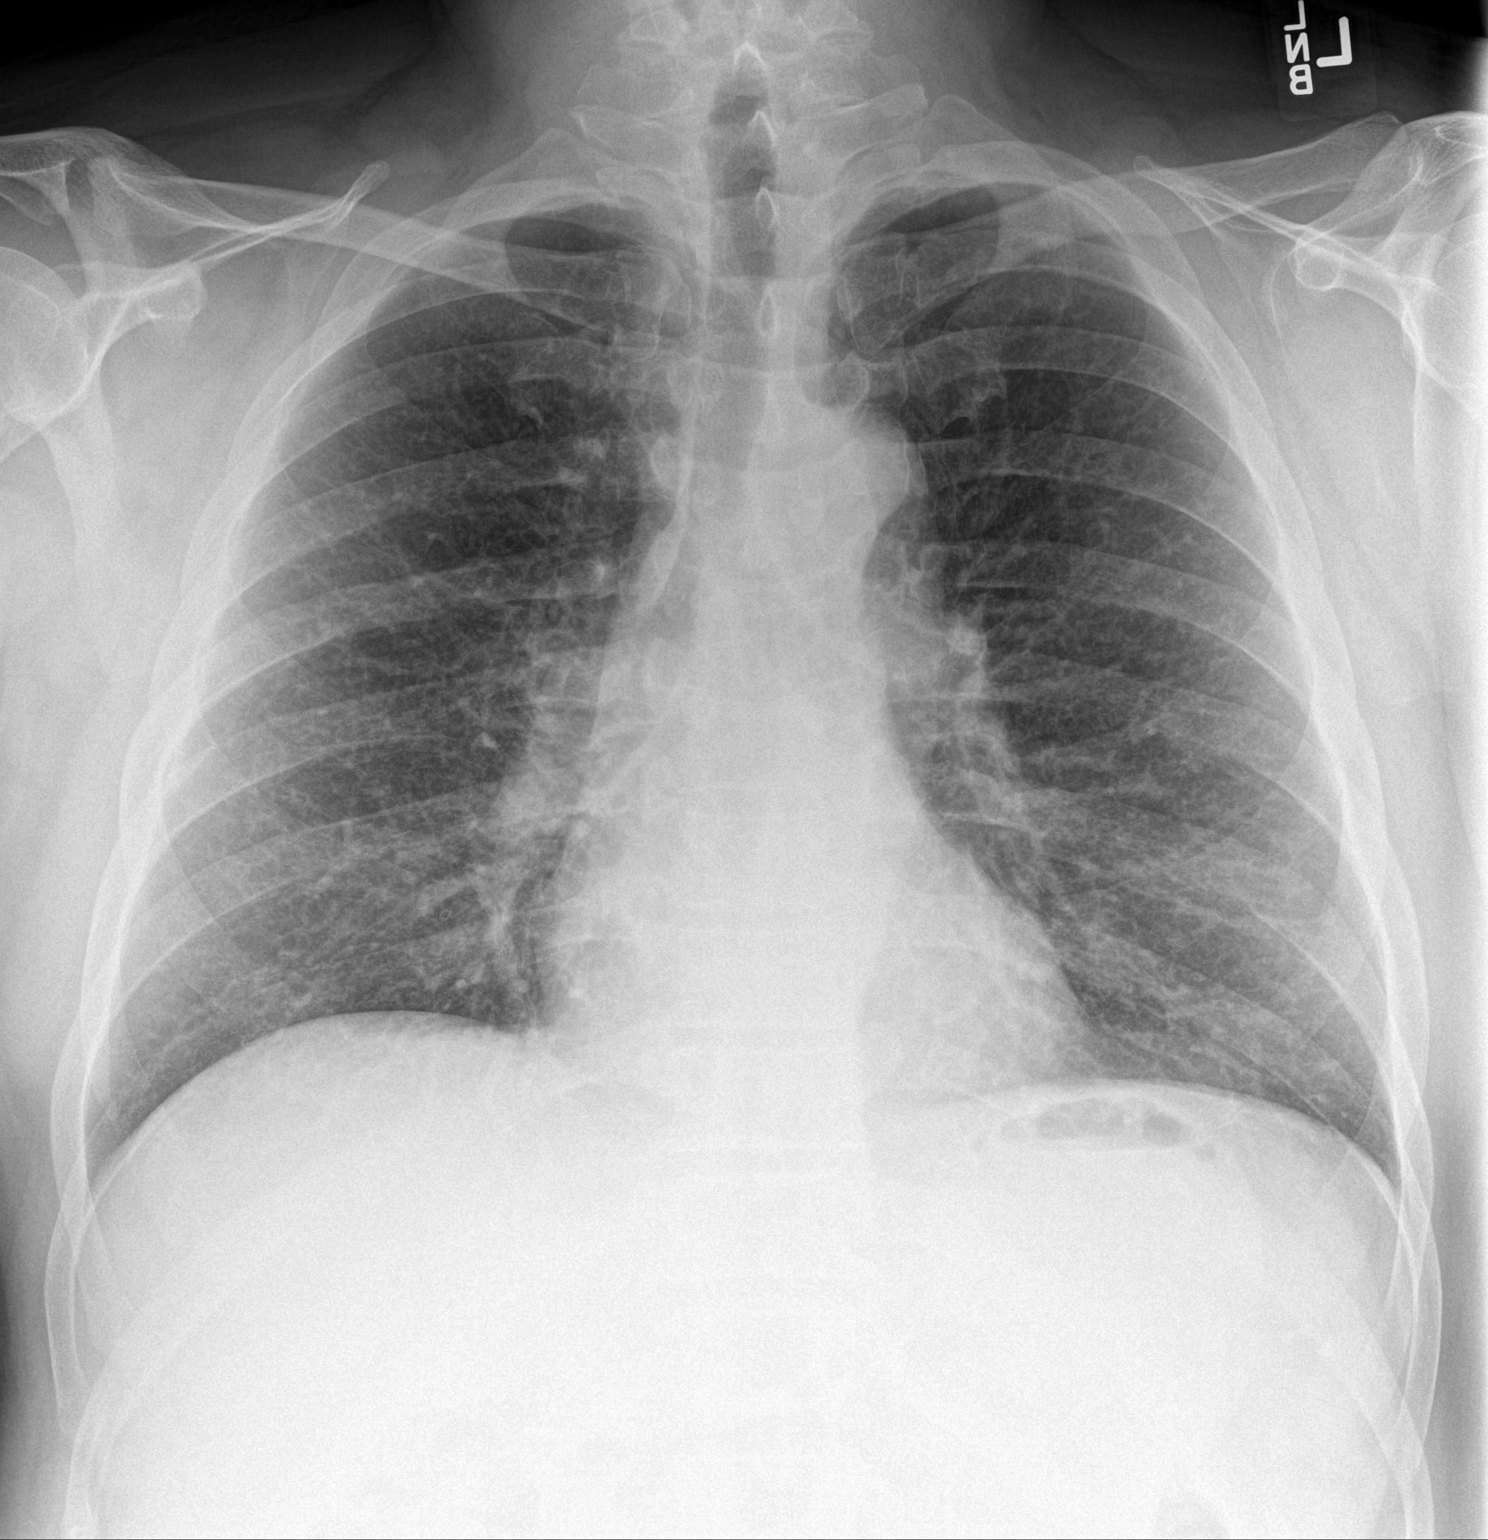
[im 2/2]
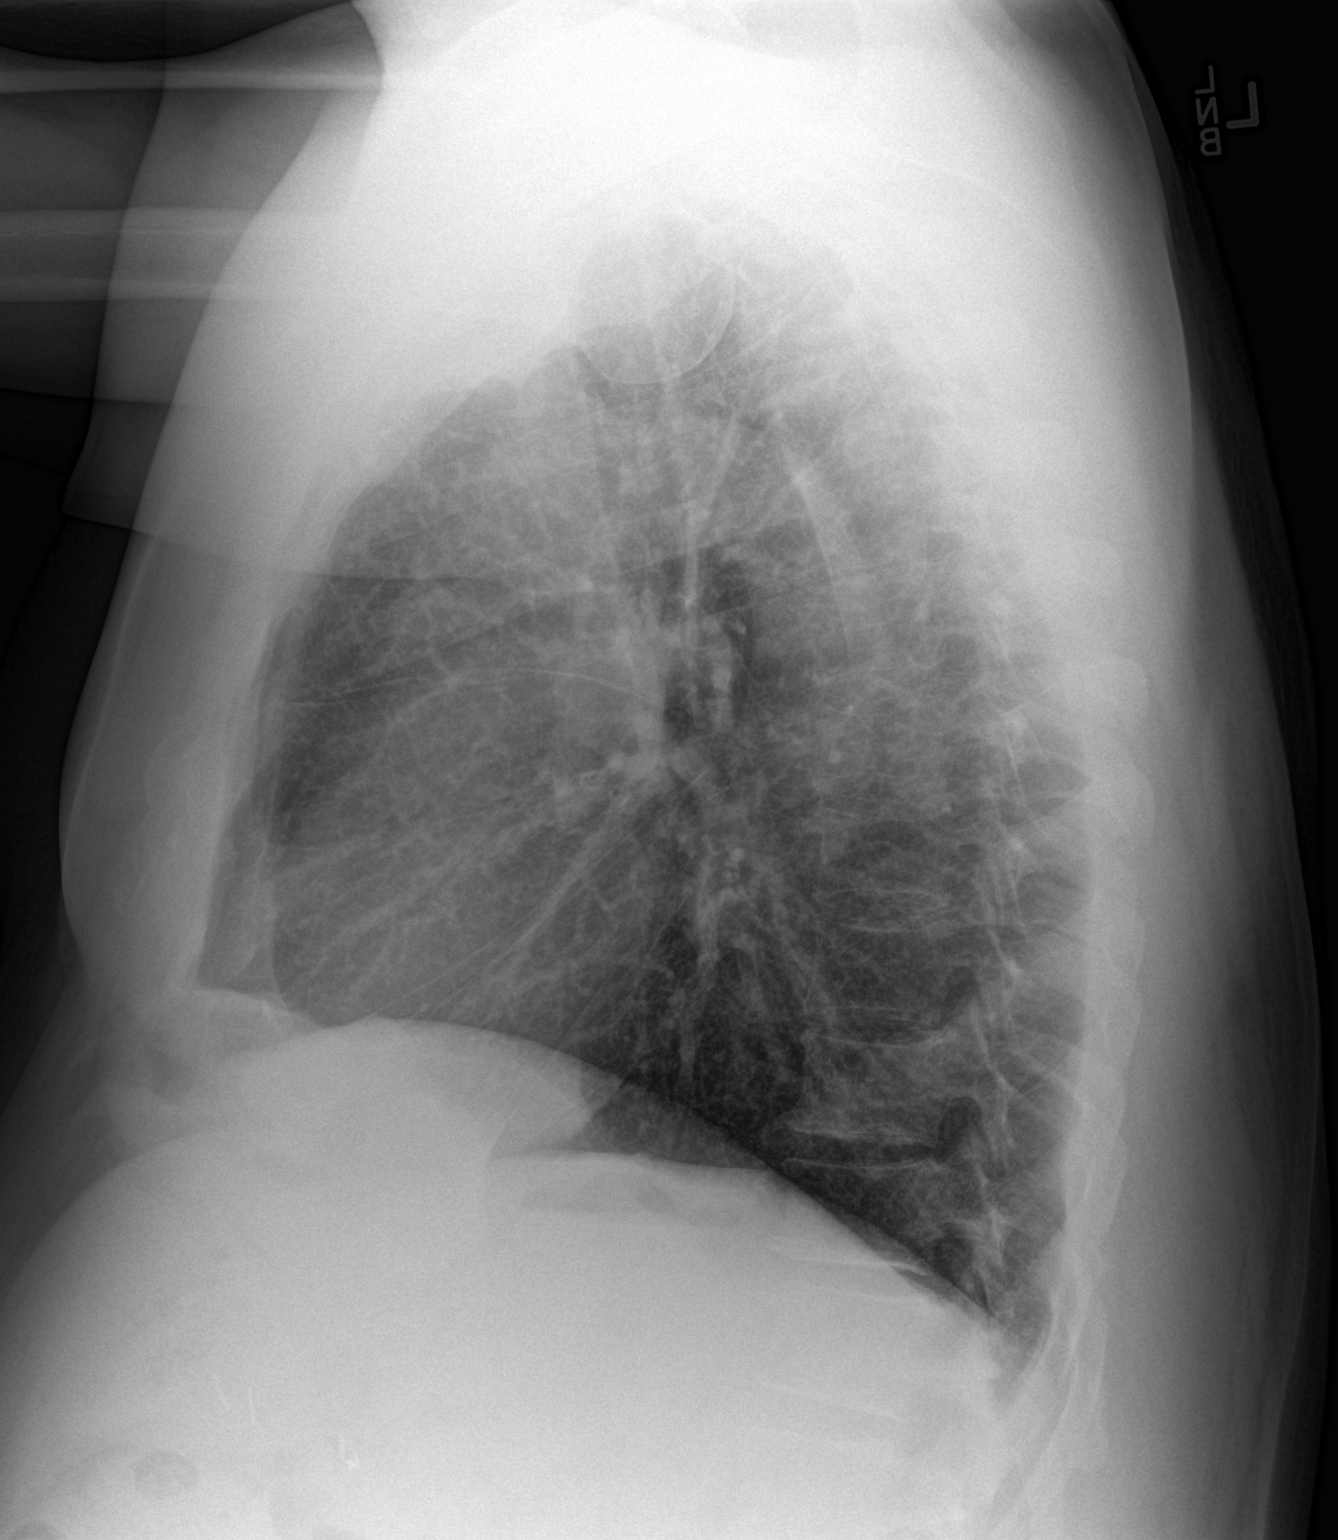

[2 of 2 positions shown; findings below may reference images not displayed]

FINDINGS: The cardiomediastinal silhouette is unremarkable.

Peribronchial/interstitial thickening again noted.

There is no evidence of focal airspace disease, pulmonary edema,
suspicious pulmonary nodule/mass, pleural effusion, or pneumothorax.
No acute bony abnormalities are identified.
IMPRESSION: No evidence of acute intracranial abnormality.

Mild chronic peribronchial/interstitial thickening.

## 2016-02-28 IMAGING — CT CT HEAD W/O CM
4 series · 18 of 30 positions shown, 19 images · non-contrast
Comparison: 12/02/2014

CLINICAL DATA: Alcohol. Uncontrollable right hand movement and
slurred speech. Left-sided numbness.

EXAM:
CT HEAD WITHOUT CONTRAST
TECHNIQUE: Contiguous axial images were obtained from the base of the skull
through the vertex without intravenous contrast.

[Series 2: head bone · axial · 0.46mm/px · z∈[-186,-40]mm · 8 of 91 slices shown]
[im 9/91  bone]
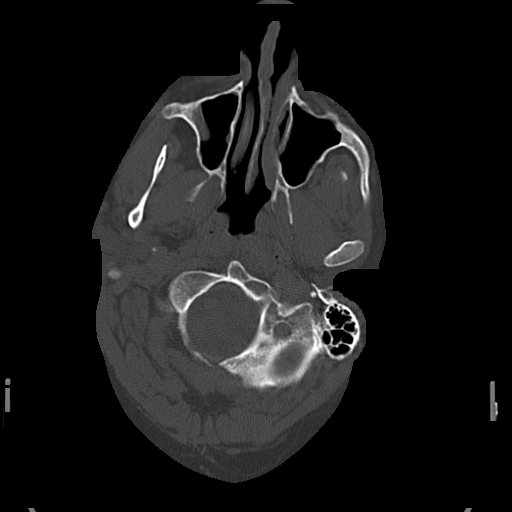
[im 17/91  bone]
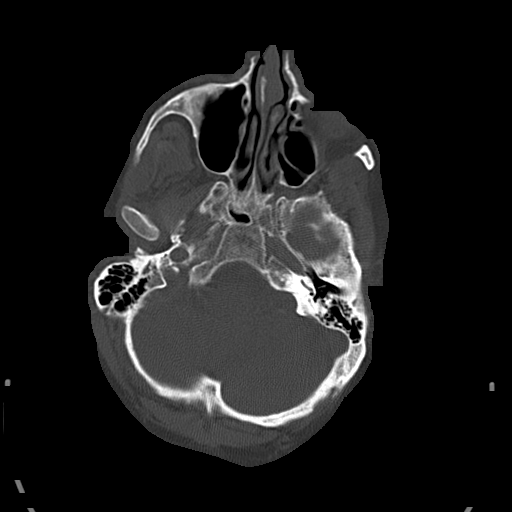
[im 33/91  bone]
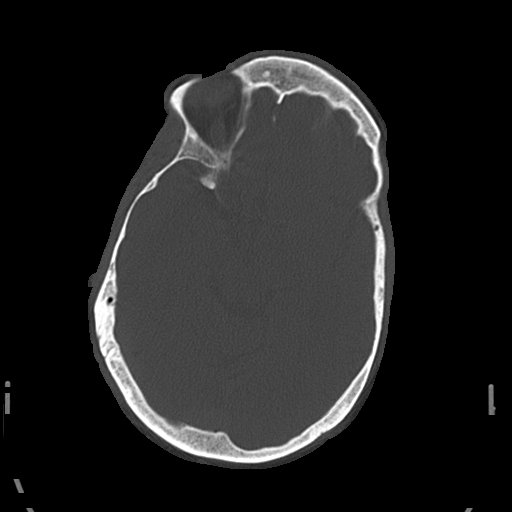
[im 41/91  bone]
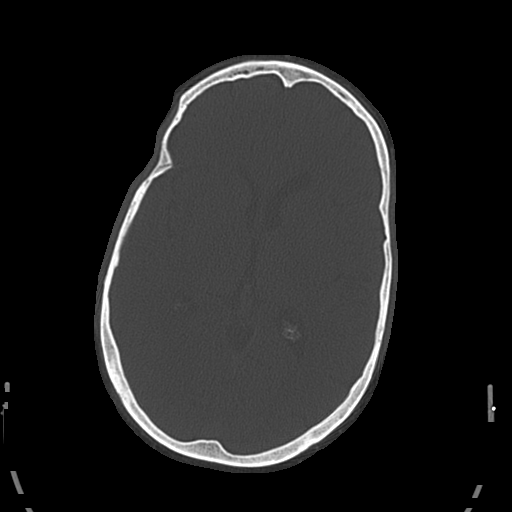
[im 50/91  bone]
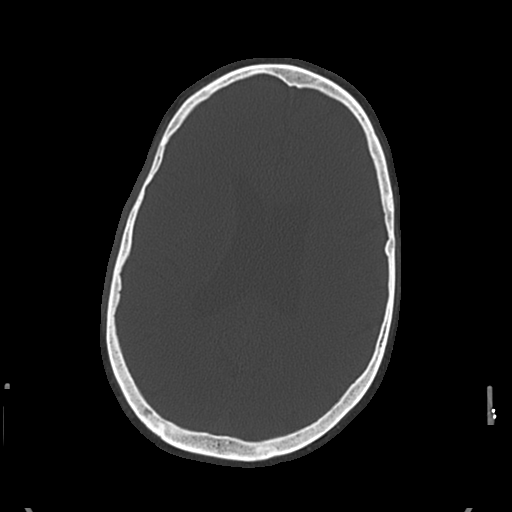
[im 58/91  bone]
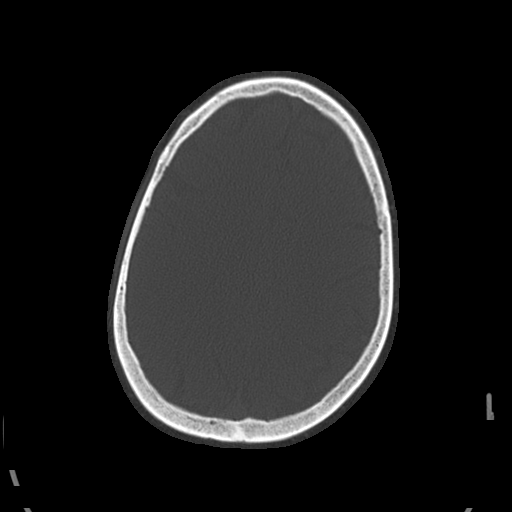
[im 74/91  bone]
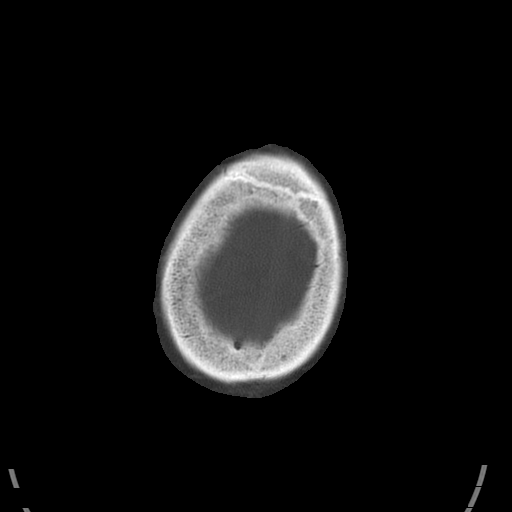
[im 82/91  bone]
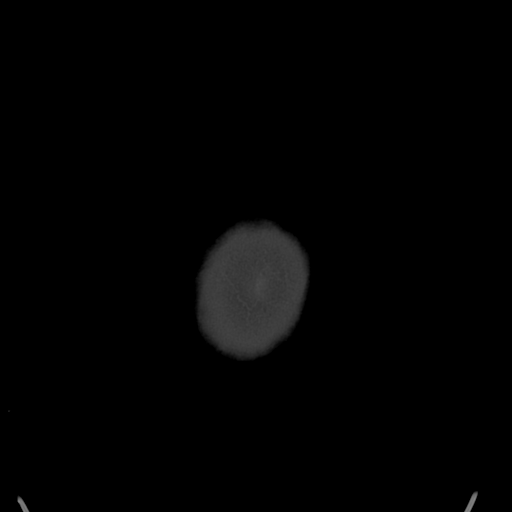

[Series 3: head wo · axial · 0.46mm/px · z∈[-143,-88]mm · 2 of 33 slices shown, 3 images]
[im 11/33  brain]
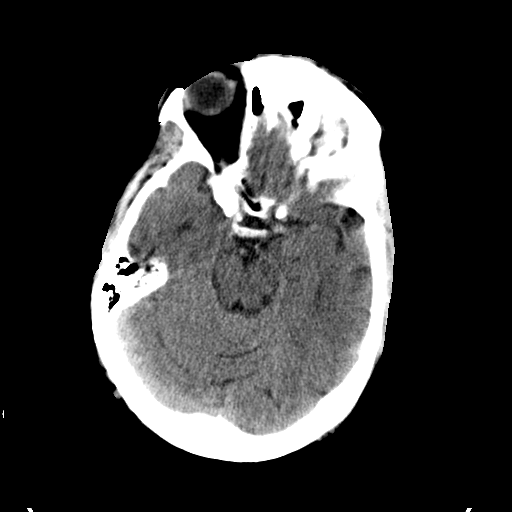
[im 11/33  bone]
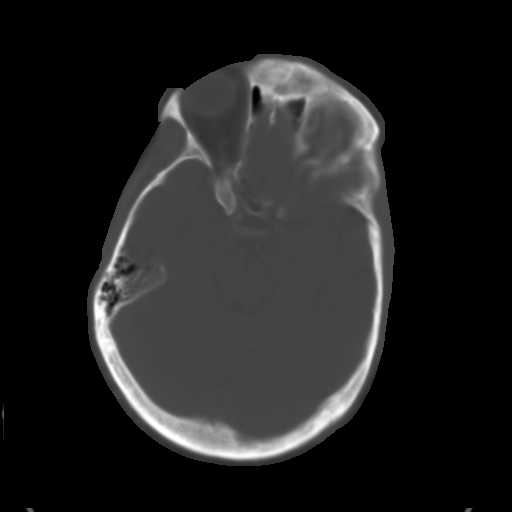
[im 22/33  brain]
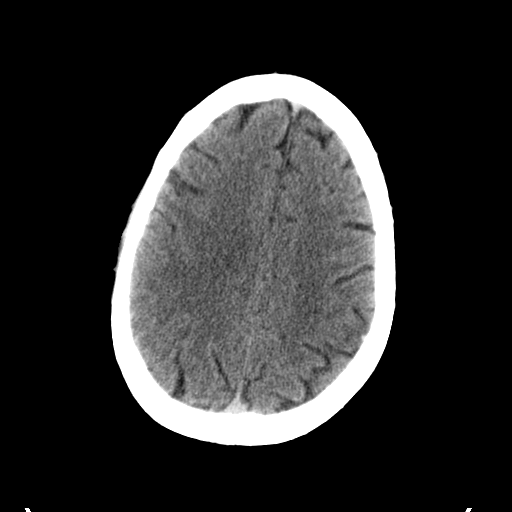

[Series 4: head wo recon · axial · 0.46mm/px · z∈[-68,-22]mm · 2 of 30 slices shown]
[im 10/30  brain]
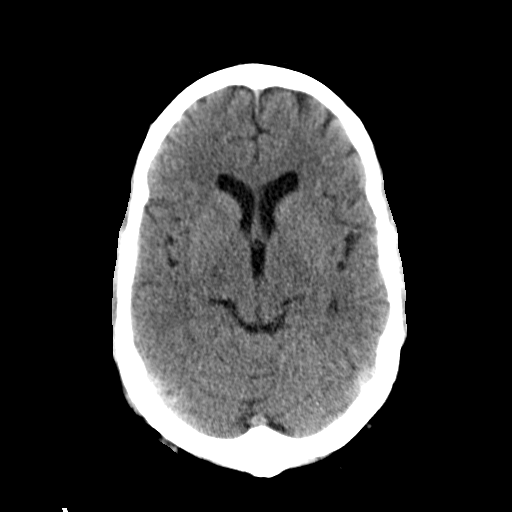
[im 20/30  brain]
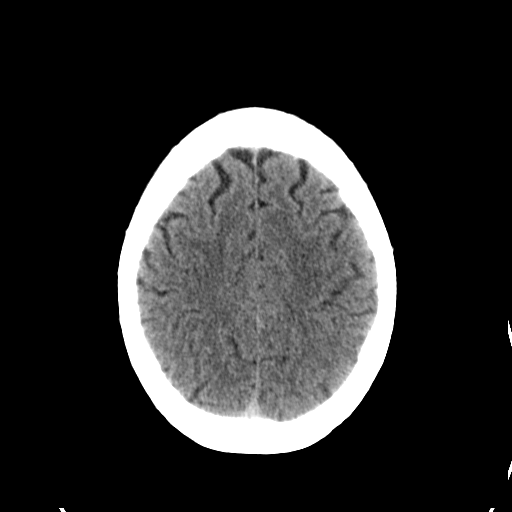

[Series 5: head bone recon · axial · 0.46mm/px · z∈[-101,-21]mm · 6 of 78 slices shown]
[im 9/78  bone]
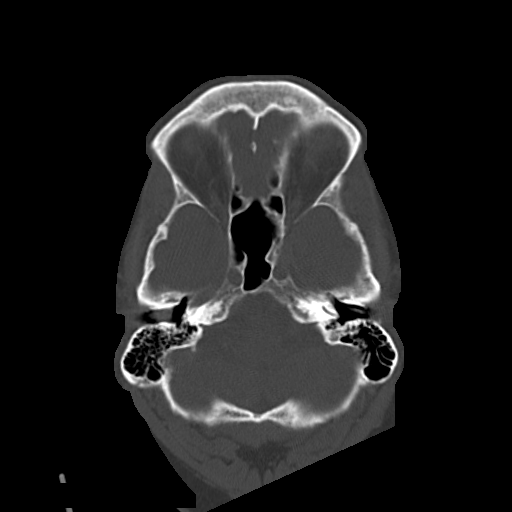
[im 18/78  bone]
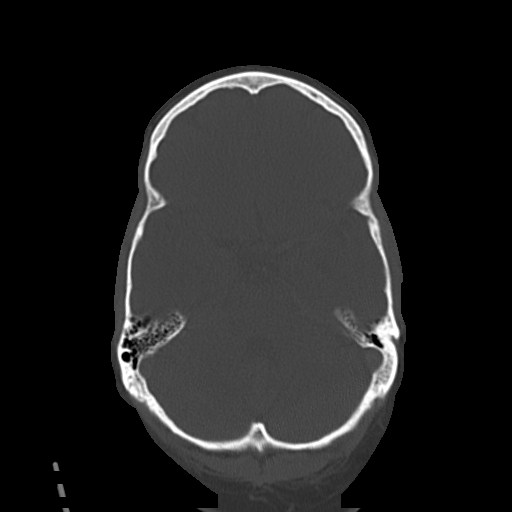
[im 26/78  bone]
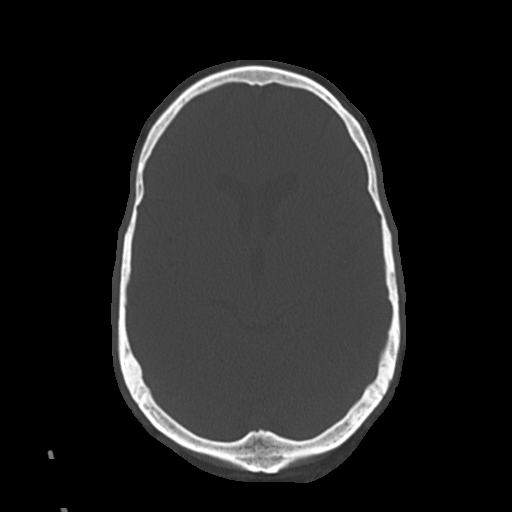
[im 35/78  bone]
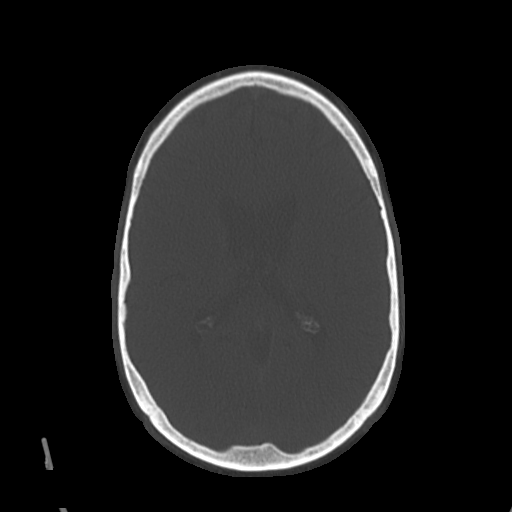
[im 43/78  bone]
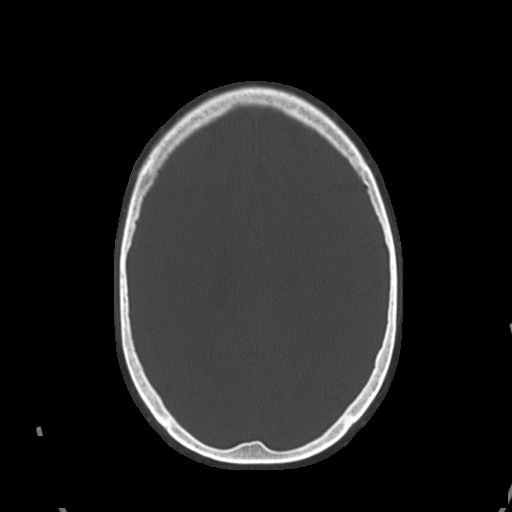
[im 52/78  bone]
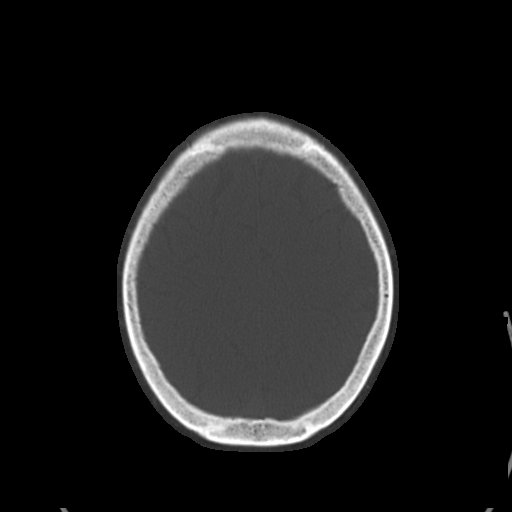

[18 of 30 positions shown; findings below may reference images not displayed]

FINDINGS: Examination is limited due to motion artifact. No mass effect or
midline shift. No abnormal extra-axial fluid collections. Gray-white
matter junctions are distinct. Basal cisterns are not effaced. No
evidence of acute intracranial hemorrhage. No depressed skull
fractures. Opacification of some of the ethmoid air cells with
mucosal thickening in the maxillary antra. Mastoid air cells are not
opacified. Old nasal bone fractures.
IMPRESSION: No acute intracranial abnormalities. Probable inflammatory changes
in the paranasal sinuses.

## 2016-02-28 IMAGING — US US CAROTID DUPLEX BILAT
1 series · 13 of 24 positions shown · non-contrast
Comparison: None.

CLINICAL DATA: Left-sided numbness, possible cerebrovascular
accident.

EXAM:
BILATERAL CAROTID DUPLEX ULTRASOUND
TECHNIQUE: Gray scale imaging, color Doppler and duplex ultrasound were
performed of bilateral carotid and vertebral arteries in the neck.

[Series 1: us carotid duplex bilat · 0.06mm/px · 13 of 82 slices shown]
[im 1/82]
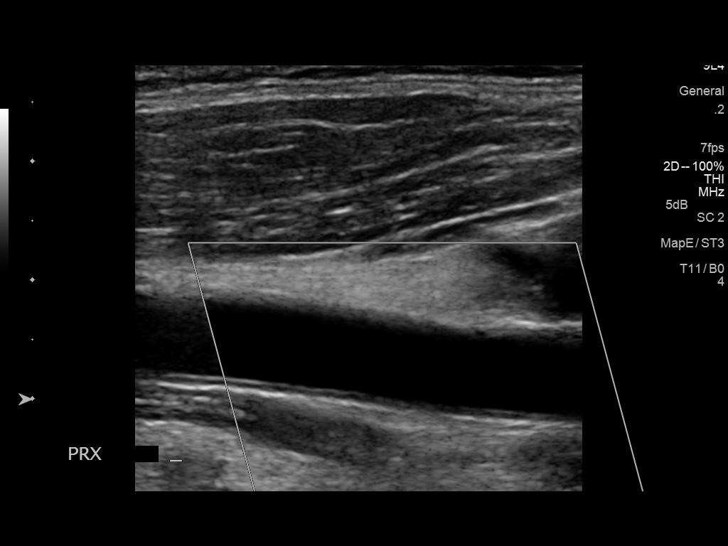
[im 8/82]
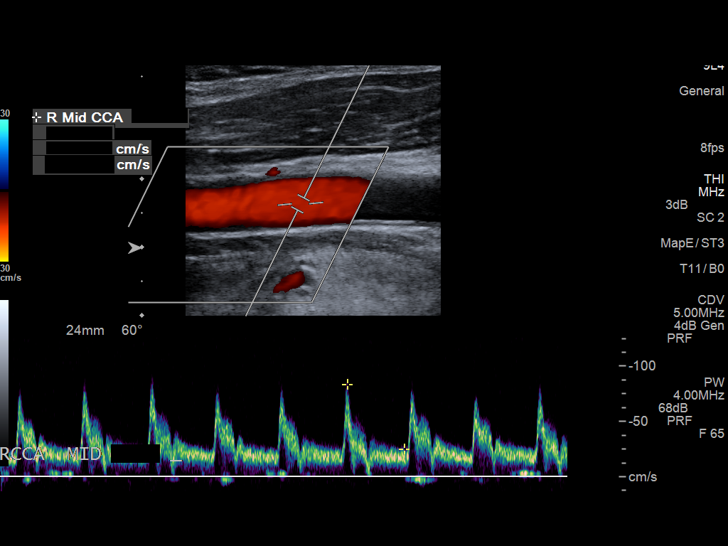
[im 15/82]
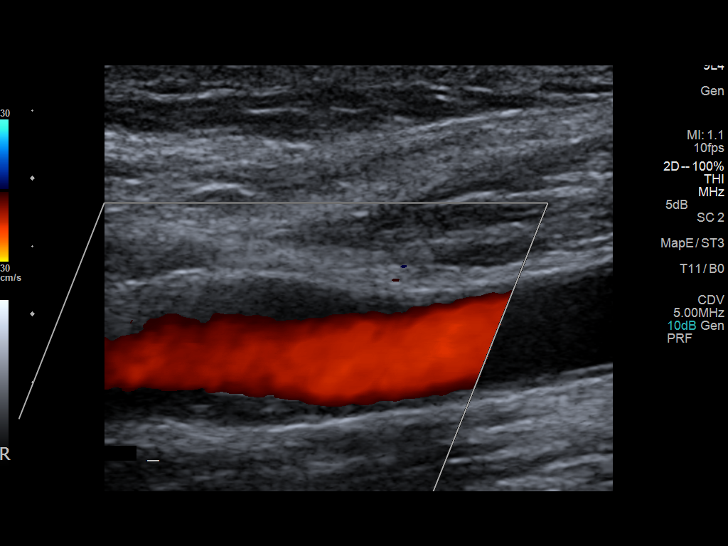
[im 22/82]
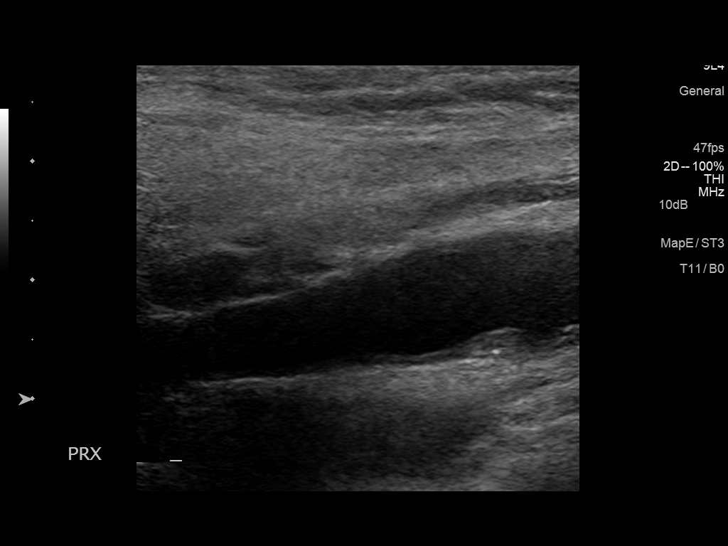
[im 29/82]
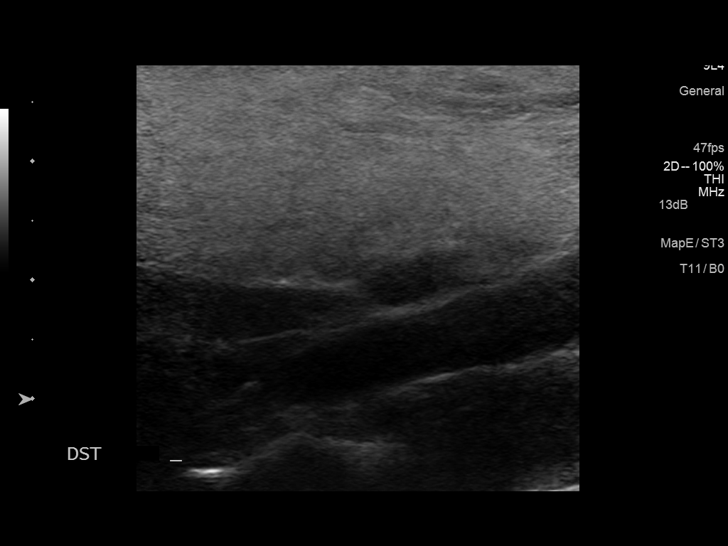
[im 36/82]
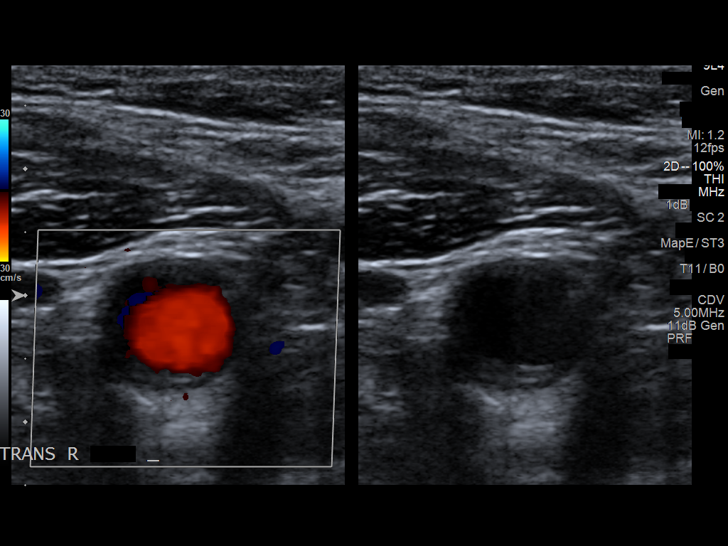
[im 43/82]
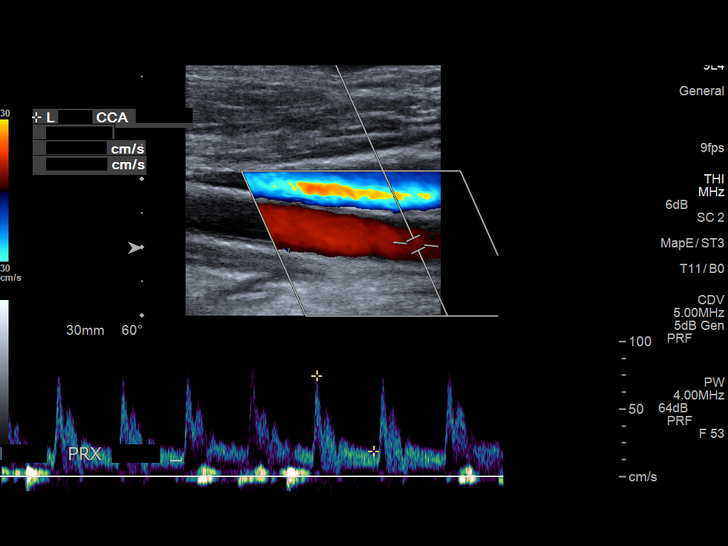
[im 46/82]
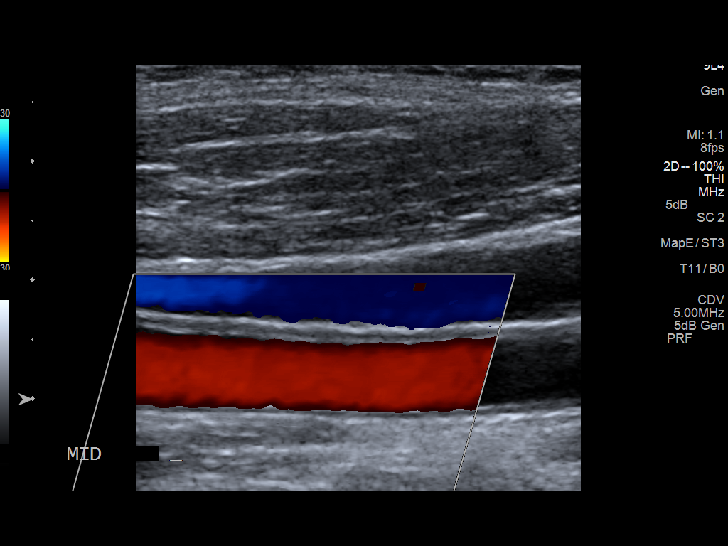
[im 53/82]
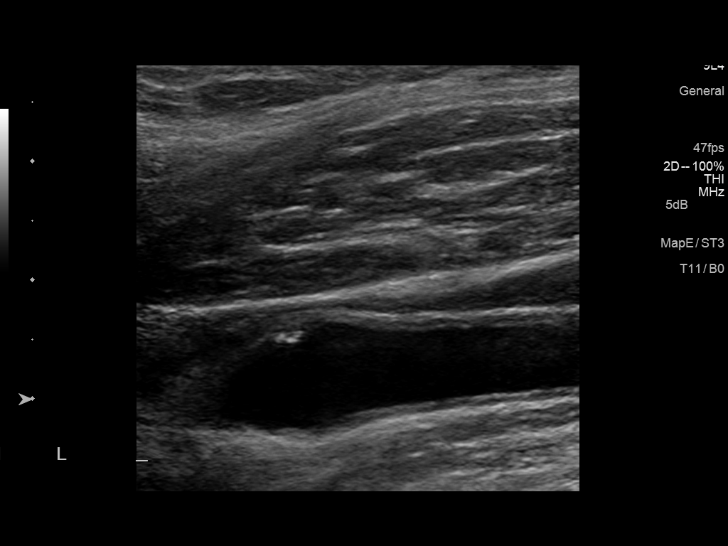
[im 60/82]
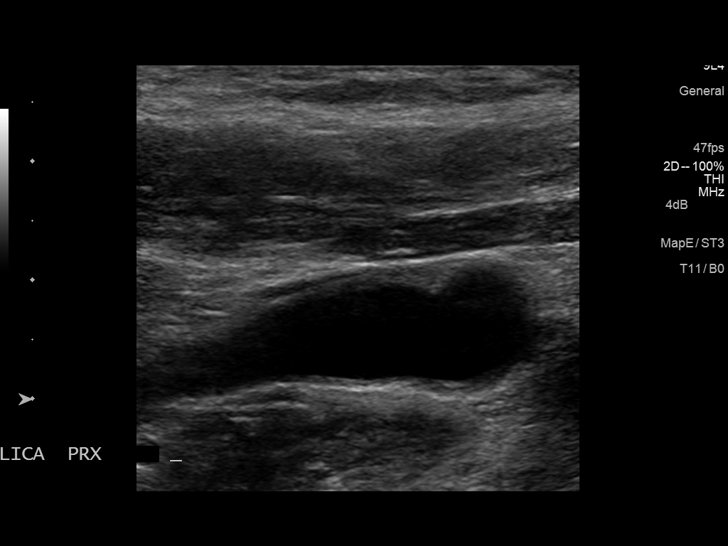
[im 67/82]
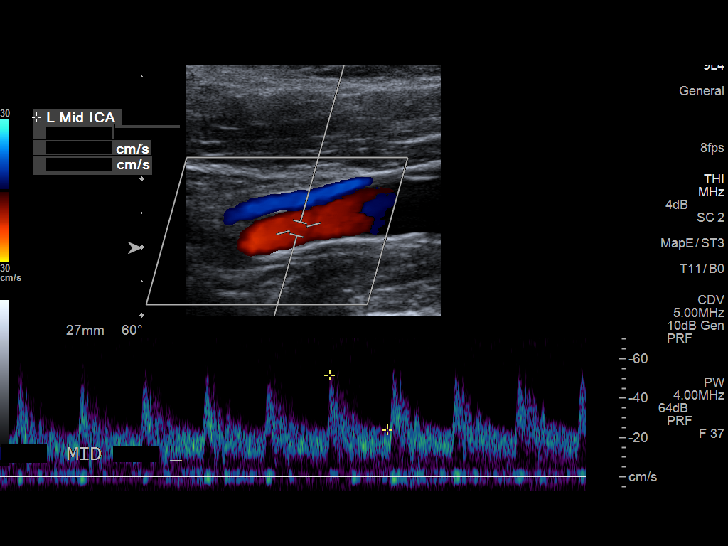
[im 74/82]
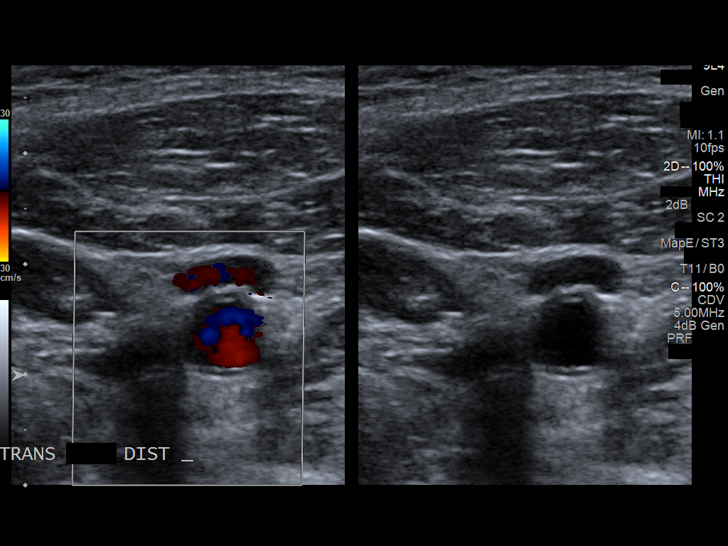
[im 82/82]
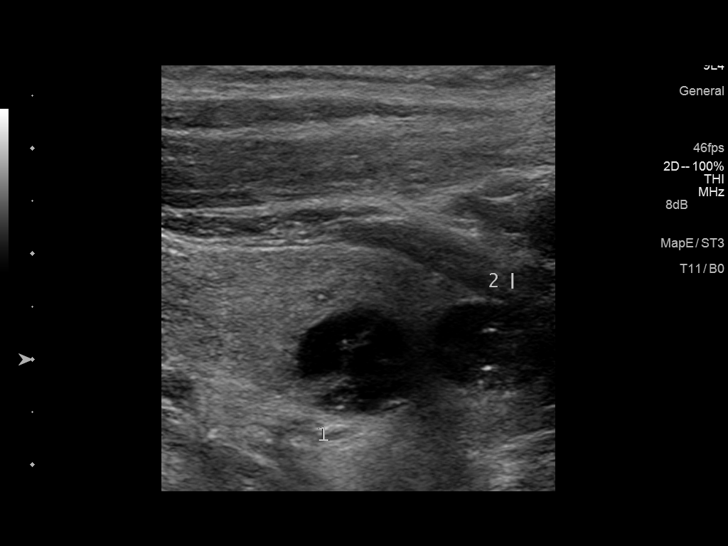

[13 of 24 positions shown; findings below may reference images not displayed]

FINDINGS: Criteria: Quantification of carotid stenosis is based on velocity
parameters that correlate the residual internal carotid diameter
with NASCET-based stenosis levels, using the diameter of the distal
internal carotid lumen as the denominator for stenosis measurement.

The following velocity measurements were obtained:

RIGHT

ICA:  66/29 cm/sec

CCA:  84/25 cm/sec

SYSTOLIC ICA/CCA RATIO:

DIASTOLIC ICA/CCA RATIO:

ECA:  113 cm/sec

LEFT

ICA:  69/31 cm/sec

CCA:  82/24 cm/sec

SYSTOLIC ICA/CCA RATIO:

DIASTOLIC ICA/CCA RATIO:

ECA:  94 cm/sec

RIGHT CAROTID ARTERY: Mild plaque formation is noted in right
carotid bulb and proximal right internal carotid artery consistent
with less than 50% diameter stenosis based on ultrasound and Doppler
criteria.

RIGHT VERTEBRAL ARTERY:  Antegrade flow is noted.

LEFT CAROTID ARTERY: Mild plaque formation is noted in the left
carotid bulb and proximal left internal carotid artery consistent
with less than 50% diameter stenosis based on ultrasound and Doppler
criteria.

LEFT VERTEBRAL ARTERY:  Antegrade flow is noted.

Incidental note is made of 2 complex but predominantly cystic
nodules in the right thyroid lobe.
IMPRESSION: No hemodynamically significant plaque or stenosis is noted in the
cervical carotid arteries bilaterally.

Two complex but predominantly cystic nodules are noted in the right
thyroid lobe. Thyroid ultrasound is recommended for further
evaluation.

## 2016-02-28 IMAGING — CR DG CHEST 1V PORT
1 series · 2 of 2 positions shown · non-contrast
Comparison: 02/25/2015

CLINICAL DATA: Altered mental status. Left-sided weakness. Smoker.

EXAM:
PORTABLE CHEST - 1 VIEW

[Series 1: ap · 0.17mm/px · 2 of 2 slices shown]
[im 1/2]
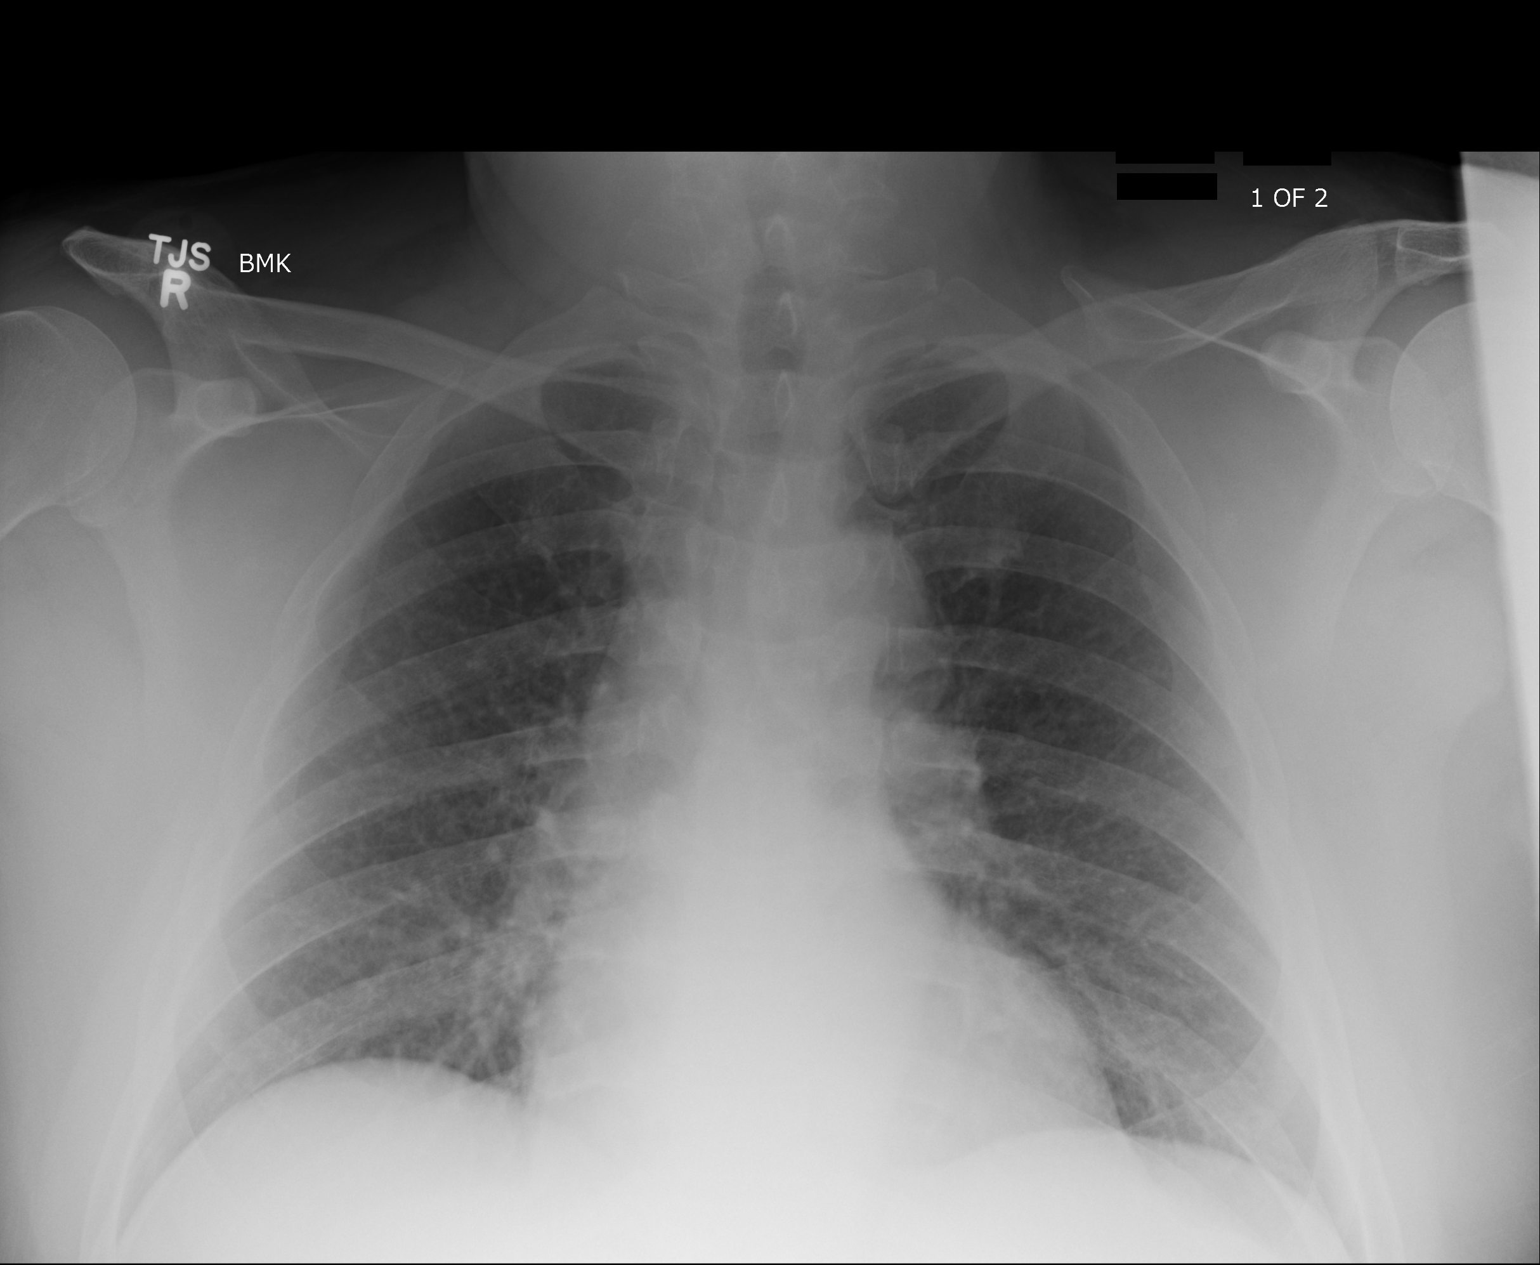
[im 2/2]
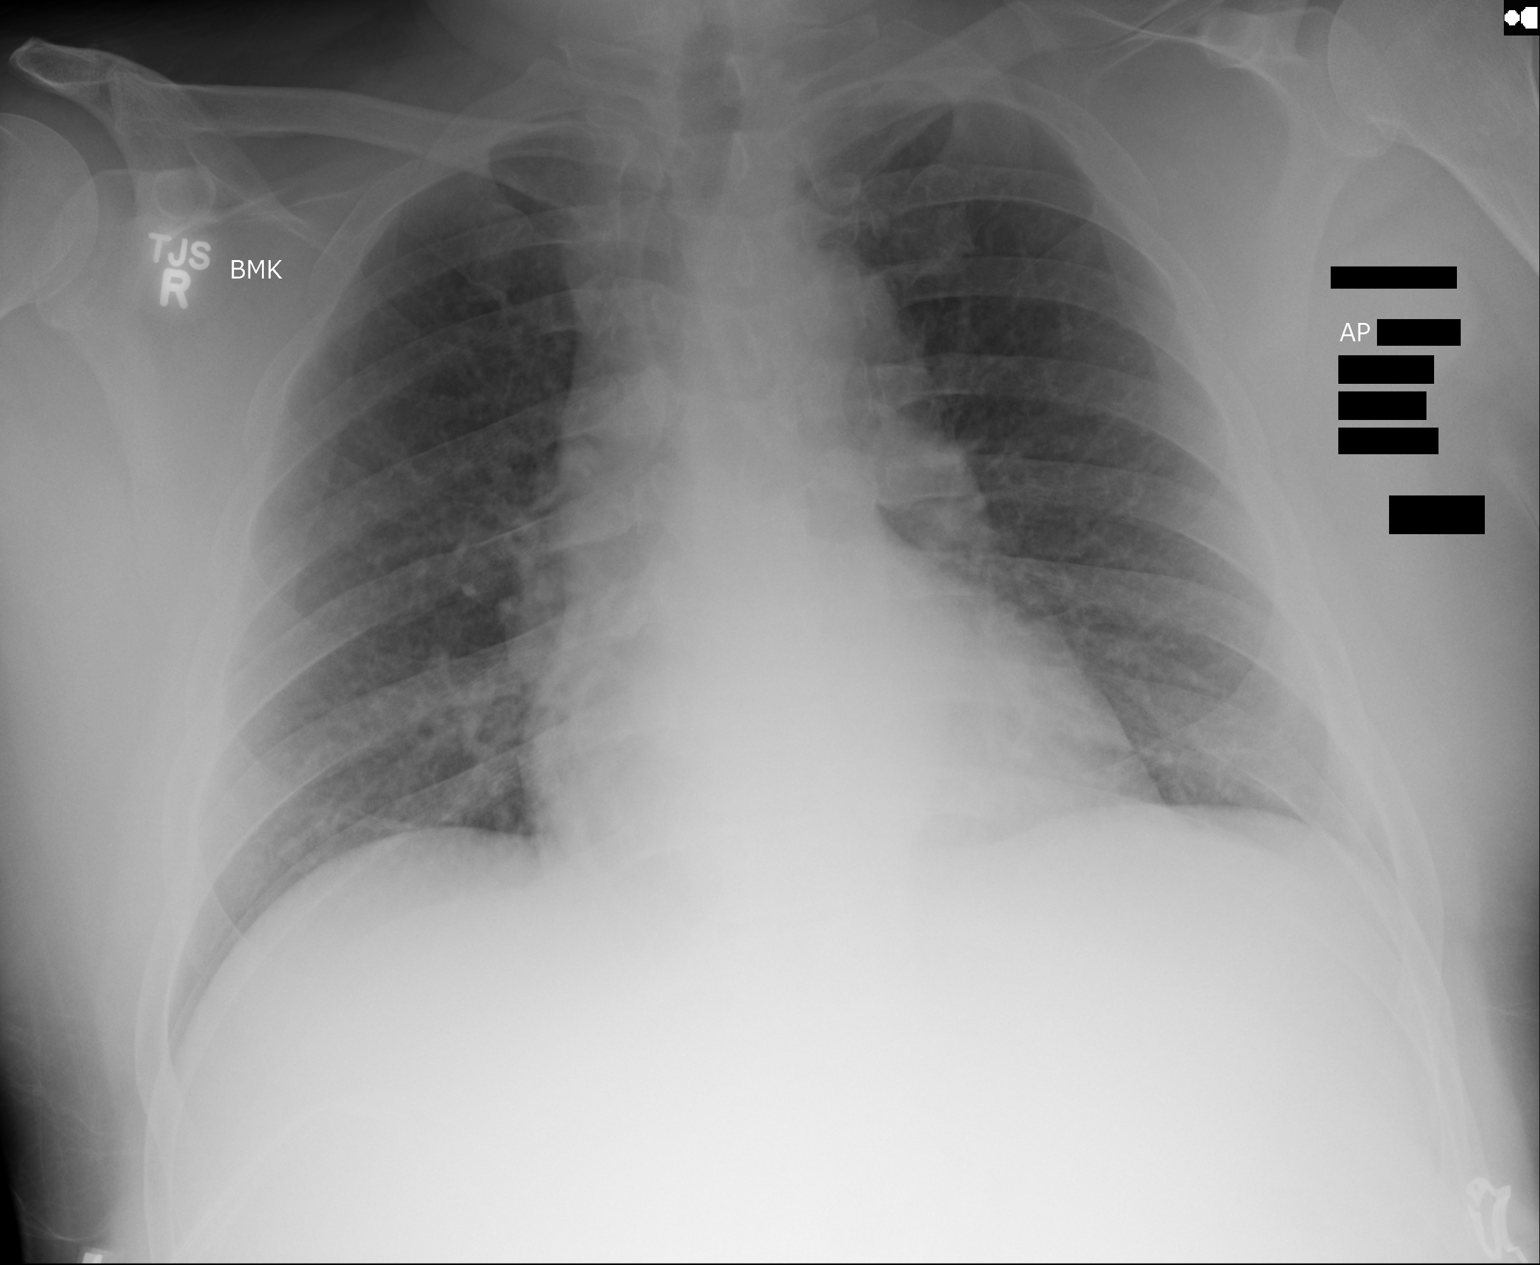

[2 of 2 positions shown; findings below may reference images not displayed]

FINDINGS: AP portable views of the chest demonstrate no focal airspace
consolidation or alveolar edema. The lungs are grossly clear. There
is no large effusion or pneumothorax. Cardiac and mediastinal
contours appear unremarkable.
IMPRESSION: No active disease.

## 2016-02-28 IMAGING — MR MR HEAD WO/W CM
10 of 12 series · 38 of 48 positions shown · IV contrast (multihance)
Comparison: CT head without contrast from the same day.

CLINICAL DATA: Left-sided weakness and numbness. Speech
disturbance. Initial encounter. Symptoms began 17 hours ago.

EXAM:
MRI HEAD WITHOUT AND WITH CONTRAST
TECHNIQUE: Multiplanar, multiecho pulse sequences of the brain and surrounding
structures were obtained without and with intravenous contrast.
CONTRAST:  20mL MULTIHANCE GADOBENATE DIMEGLUMINE 529 MG/ML IV SOLN

[Series 5: DWI · axial · 4.0mm · 0.94mm/px · z∈[-52,+116]mm · 5 of 43 slices shown (1 of 2)]
[im 1/43]
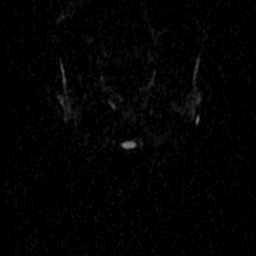
[im 11/43]
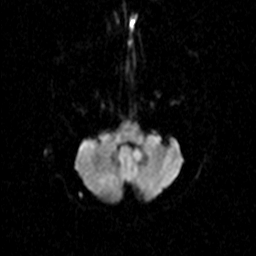
[im 22/43]
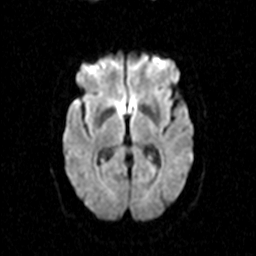
[im 32/43]
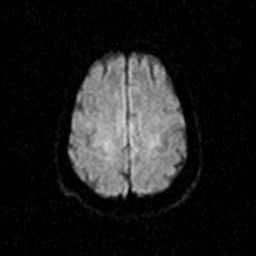
[im 43/43]
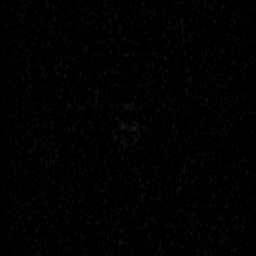

[Series 6: ADC · axial · 4.0mm · 0.94mm/px · z∈[-52,+116]mm · 5 of 43 slices shown (1 of 2)]
[im 1/43]
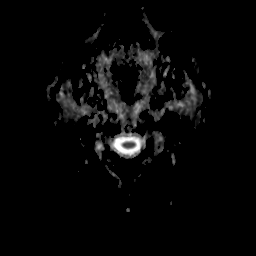
[im 11/43]
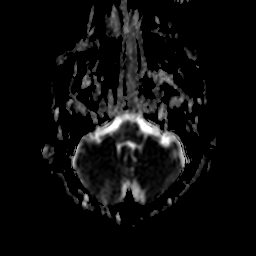
[im 22/43]
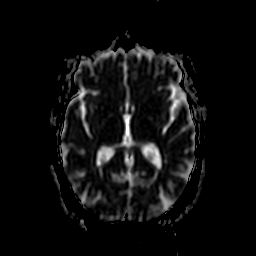
[im 32/43]
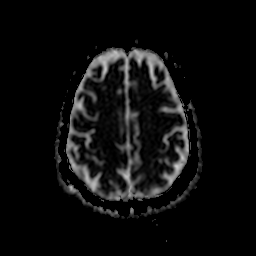
[im 43/43]
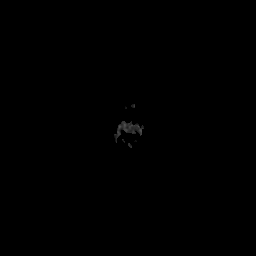

[Series 9: T2 · axial · 5.0mm · 0.45mm/px · z∈[-46,+110]mm · 3 of 25 slices shown (1 of 2)]
[im 1/25]
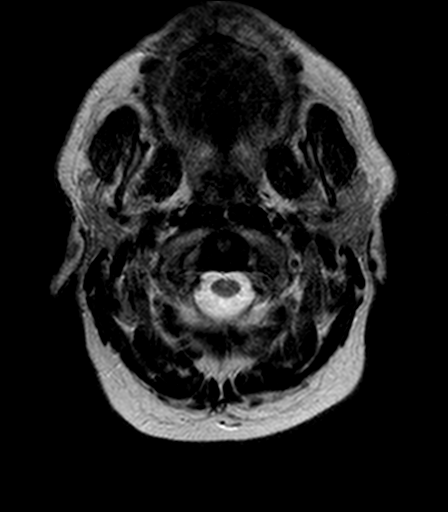
[im 13/25]
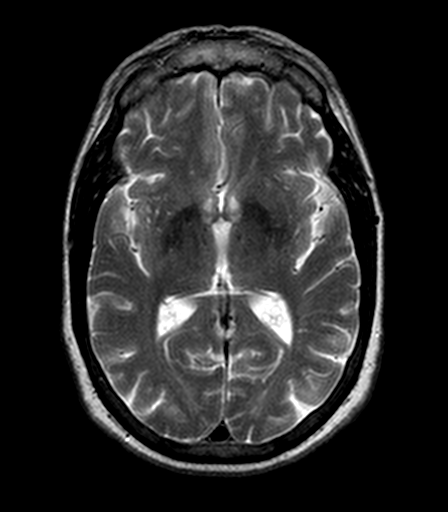
[im 25/25]
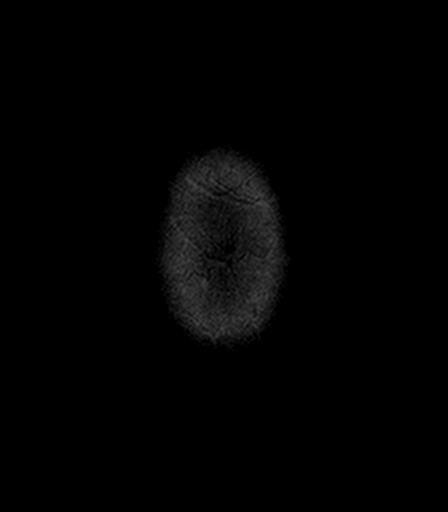

[Series 10: DWI · coronal · 5.0mm · 1.80mm/px · 4 of 38 slices shown (2 of 2)]
[im 1/38]
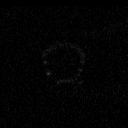
[im 13/38]
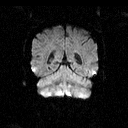
[im 25/38]
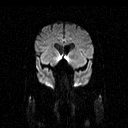
[im 38/38]
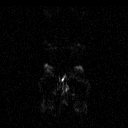

[Series 11: ADC · coronal · 5.0mm · 1.80mm/px · 4 of 38 slices shown (2 of 2)]
[im 1/38]
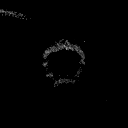
[im 13/38]
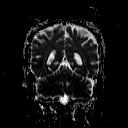
[im 25/38]
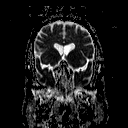
[im 38/38]
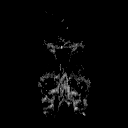

[Series 12: FLAIR · axial · 5.0mm · 0.90mm/px · z∈[-46,+110]mm · 3 of 25 slices shown]
[im 1/25]
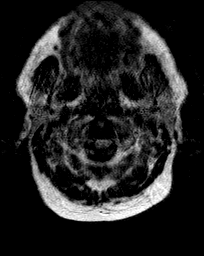
[im 13/25]
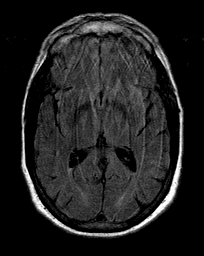
[im 25/25]
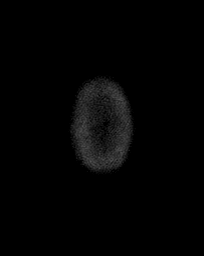

[Series 13: T2 · axial · 5.0mm · 0.45mm/px · z∈[-46,+110]mm · 3 of 25 slices shown (2 of 2)]
[im 1/25]
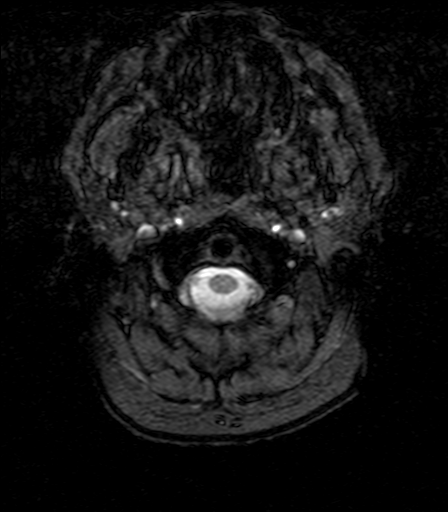
[im 13/25]
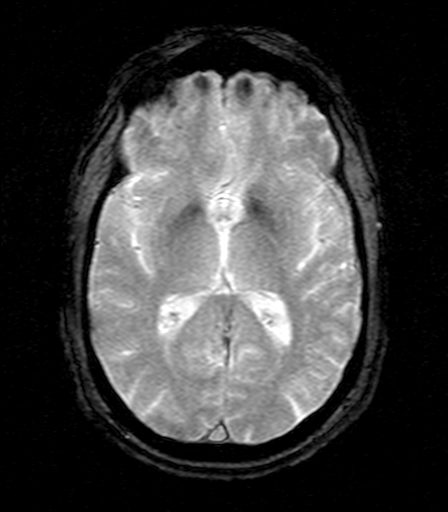
[im 25/25]
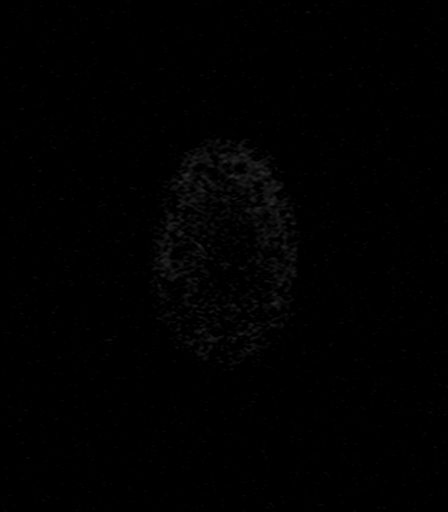

[Series 15: T2 post-contrast · coronal · 5.0mm · 0.45mm/px · 2 of 30 slices shown]
[im 1/30]
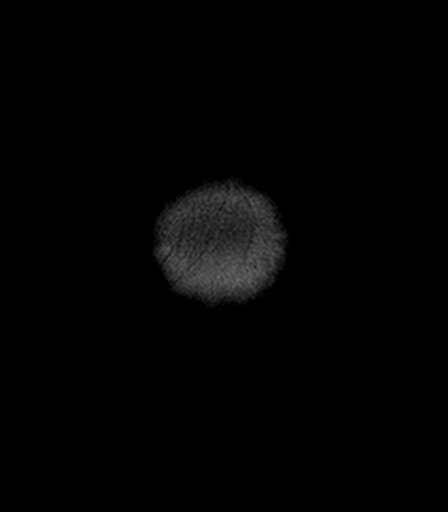
[im 15/30]
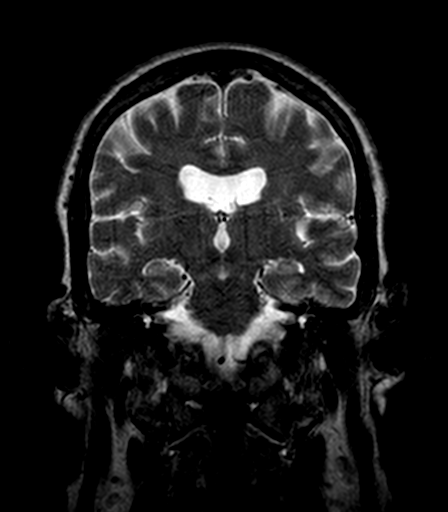

[Series 16: T1 post-contrast · axial · 3.0mm · 0.45mm/px · z∈[-51,+114]mm · 6 of 56 slices shown (1 of 2)]
[im 1/56]
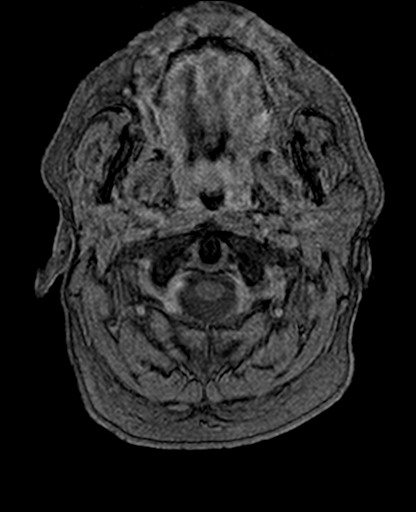
[im 12/56]
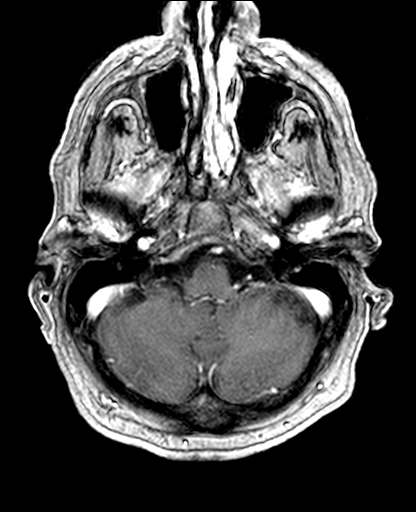
[im 23/56]
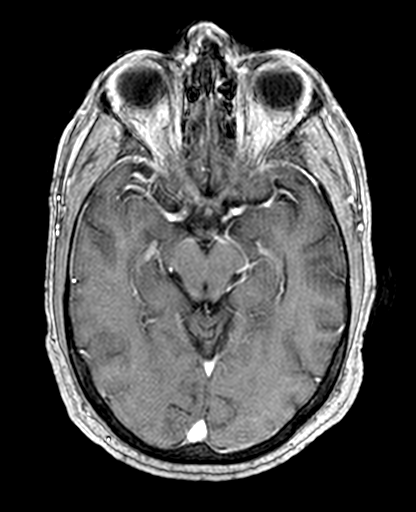
[im 34/56]
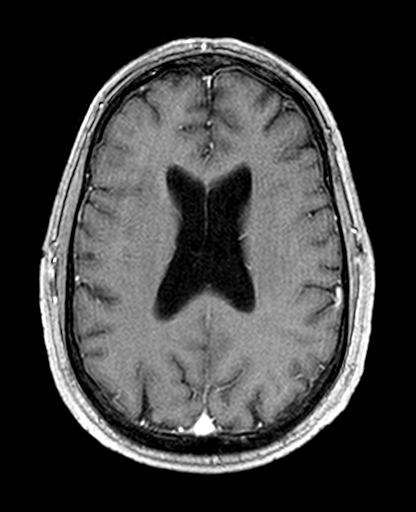
[im 45/56]
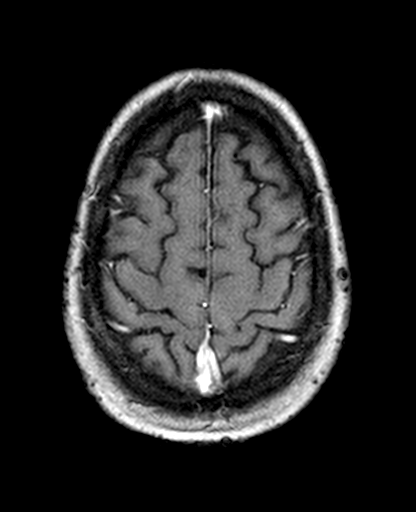
[im 56/56]
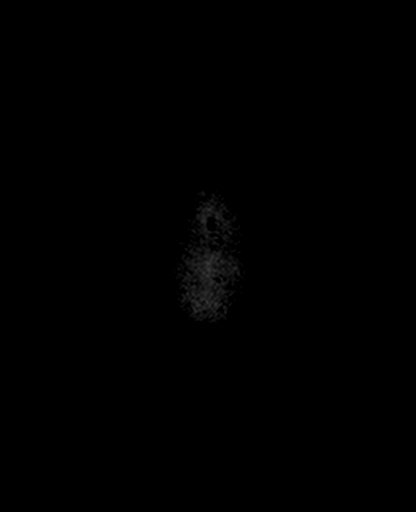

[Series 17: T1 post-contrast · coronal · 5.0mm · 0.45mm/px · 3 of 30 slices shown (2 of 2)]
[im 1/30]
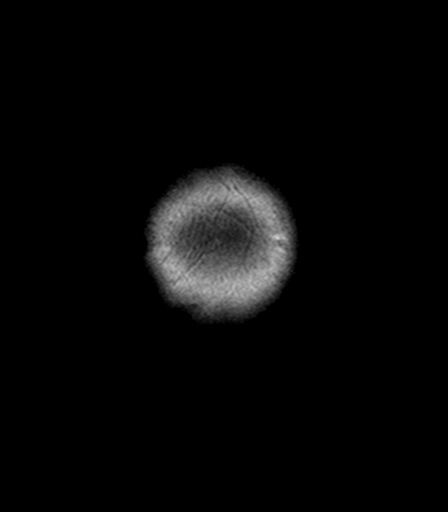
[im 15/30]
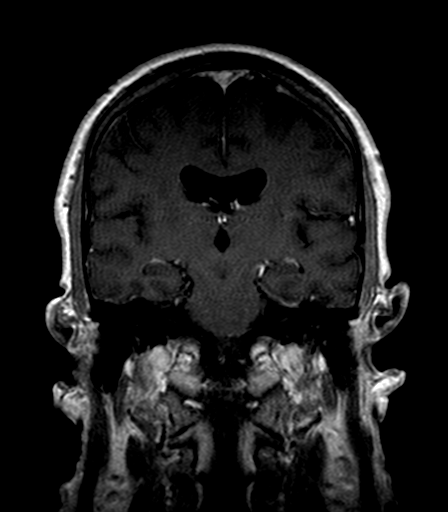
[im 30/30]
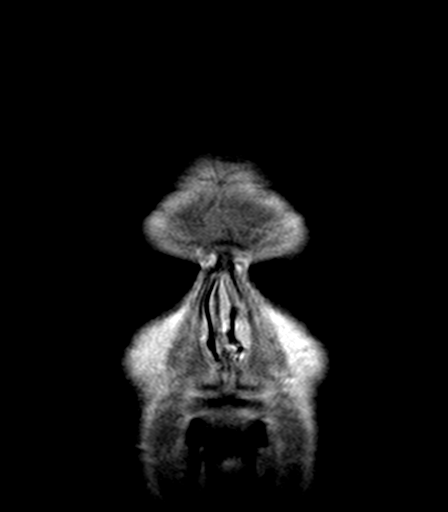

[38 of 48 positions shown; findings below may reference images not displayed]

FINDINGS: The diffusion-weighted images demonstrate no evidence for acute for
subacute infarction. No acute hemorrhage or mass lesion is present.
The study is mildly degraded by patient motion. Mild periventricular
and subcortical T2 changes are present. The ventricles are of normal
size. No significant extraaxial fluid collection is present.

Flow is present in the major intracranial arteries. Mild mucosal
thickening is present in the right maxillary sinus and bilateral
ethmoid air cells. There is some mucosal thickening in the sphenoid
sinuses. The mastoid air cells are clear. The skullbase is within
normal limits. Midline structures are normal.

The postcontrast images demonstrate no pathologic enhancement.
IMPRESSION: 1. No acute intracranial abnormality.
2. Scattered periventricular and subcortical T2 hyperintensities are
slightly greater than expected for age. The finding is nonspecific
but can be seen in the setting of chronic microvascular ischemia, a
demyelinating process such as multiple sclerosis, vasculitis,
complicated migraine headaches, or as the sequelae of a prior
infectious or inflammatory process.
3. Mild mucosal thickening throughout the paranasal sinuses. No
fluid levels are present.

## 2016-02-29 IMAGING — MR MR CERVICAL SPINE WO/W CM
8 series · 46 of 48 positions shown · IV contrast (multihance)
Comparison: 12/02/2014

CLINICAL DATA: Acute left-sided muscle weakness.

EXAM:
MRI CERVICAL SPINE WITHOUT AND WITH CONTRAST
TECHNIQUE: Multiplanar and multiecho pulse sequences of the cervical spine, to
include the craniocervical junction and cervicothoracic junction,
were obtained according to standard protocol without and with
intravenous contrast.
CONTRAST:  20mL MULTIHANCE GADOBENATE DIMEGLUMINE 529 MG/ML IV SOLN

[Series 3: T2 · sagittal · 3.0mm · 0.70mm/px · 3 of 15 slices shown (1 of 2)]
[im 1/15]
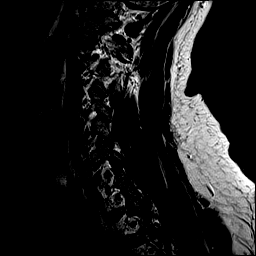
[im 8/15]
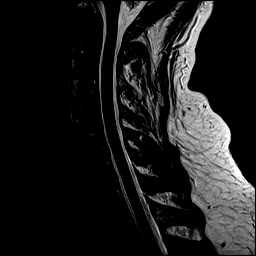
[im 15/15]
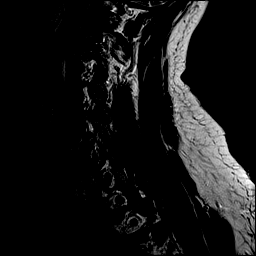

[Series 4: T1 · sagittal · 3.0mm · 0.70mm/px · 4 of 15 slices shown (1 of 2)]
[im 1/15]
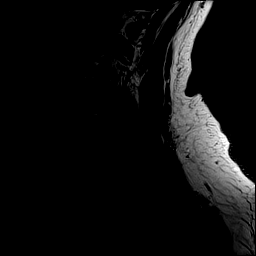
[im 5/15]
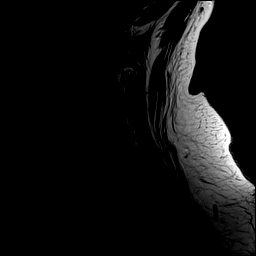
[im 10/15]
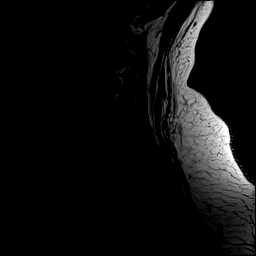
[im 15/15]
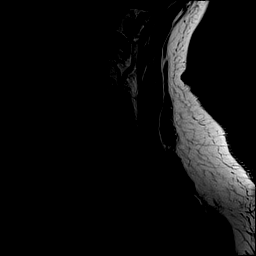

[Series 5: STIR · sagittal · 3.0mm · 0.70mm/px · 4 of 15 slices shown]
[im 1/15]
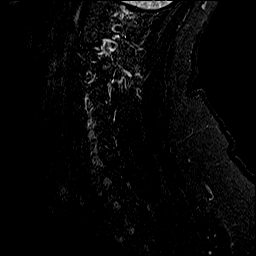
[im 5/15]
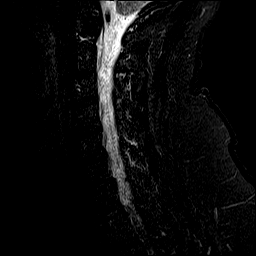
[im 10/15]
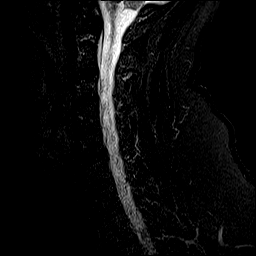
[im 15/15]
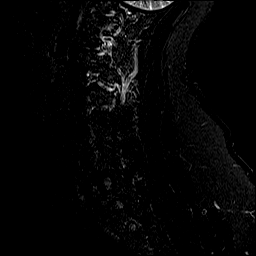

[Series 6: T2 · axial · 3.0mm · 0.70mm/px · z∈[-39,+61]mm · 8 of 27 slices shown (2 of 2)]
[im 1/27]
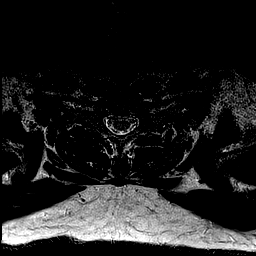
[im 4/27]
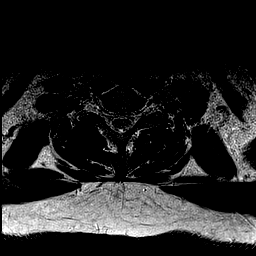
[im 8/27]
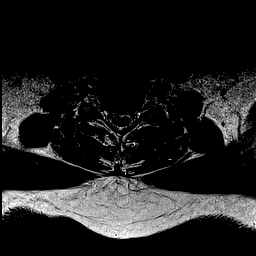
[im 12/27]
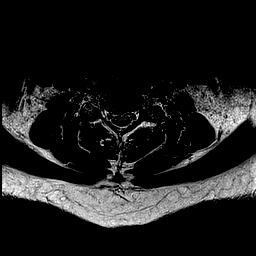
[im 15/27]
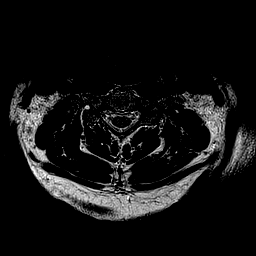
[im 19/27]
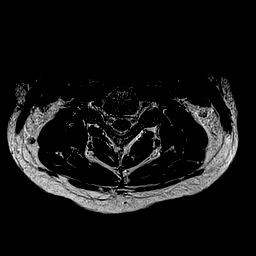
[im 23/27]
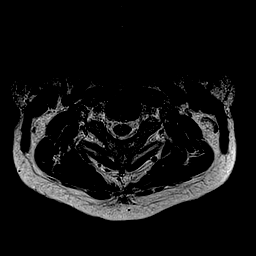
[im 27/27]
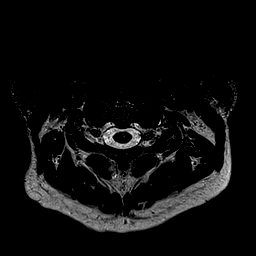

[Series 7: mpgr ax · axial · 3.0mm · 0.35mm/px · z∈[-39,+45]mm · 7 of 27 slices shown]
[im 1/27]
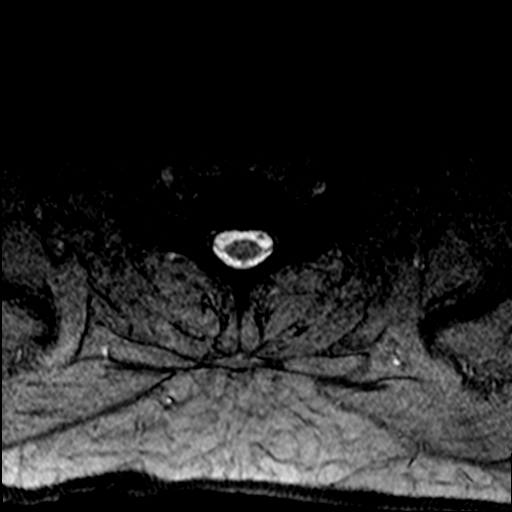
[im 4/27]
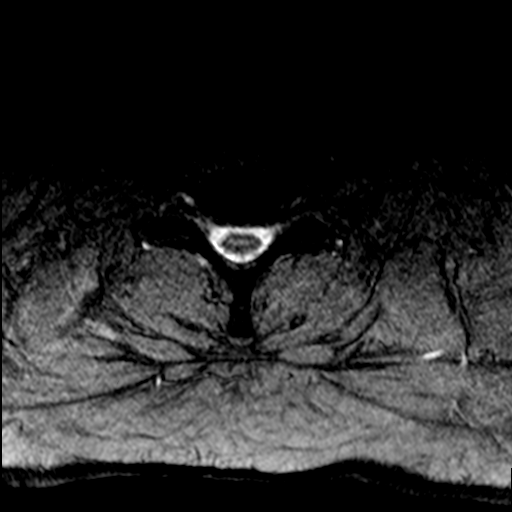
[im 8/27]
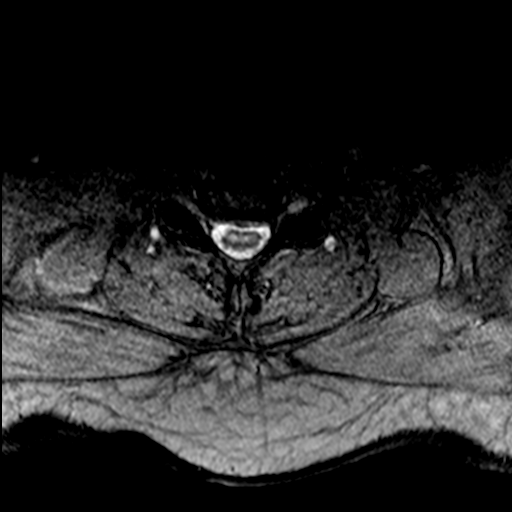
[im 12/27]
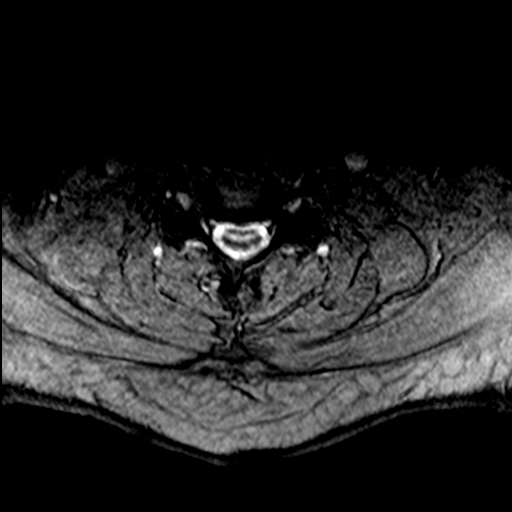
[im 15/27]
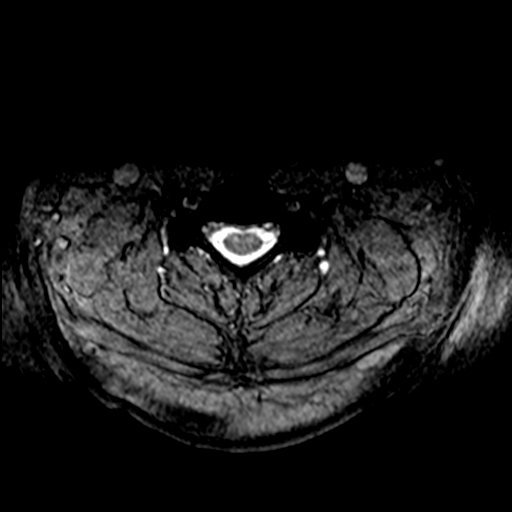
[im 19/27]
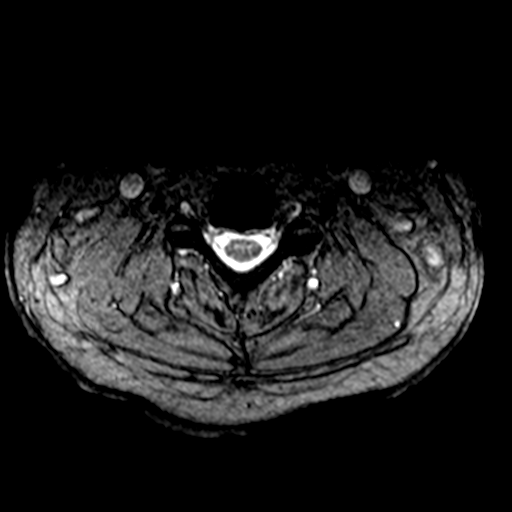
[im 23/27]
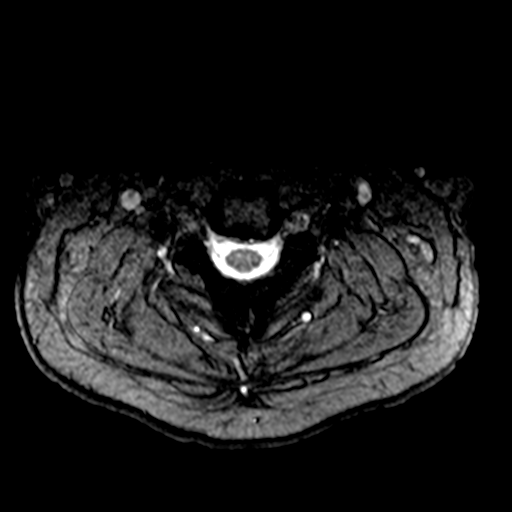

[Series 8: T1 · axial · 3.0mm · 0.70mm/px · z∈[-39,+61]mm · 8 of 27 slices shown (2 of 2)]
[im 1/27]
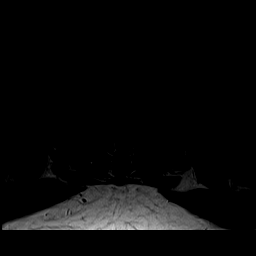
[im 4/27]
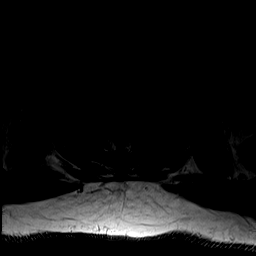
[im 8/27]
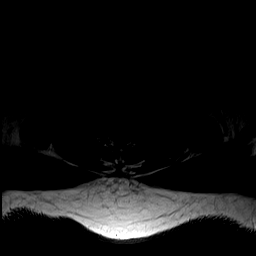
[im 12/27]
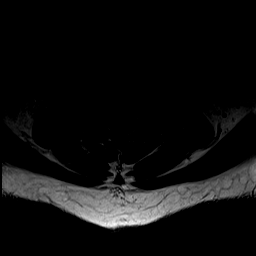
[im 15/27]
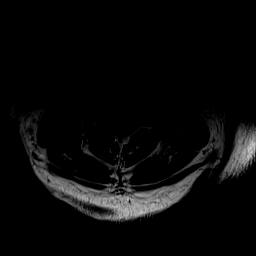
[im 19/27]
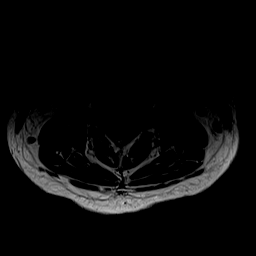
[im 23/27]
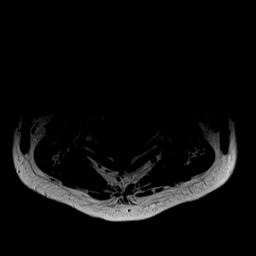
[im 27/27]
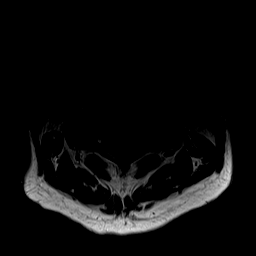

[Series 11: T1 fat-sat post-contrast · sagittal · 3.0mm · 0.70mm/px · 4 of 15 slices shown]
[im 1/15]
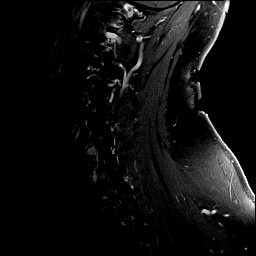
[im 5/15]
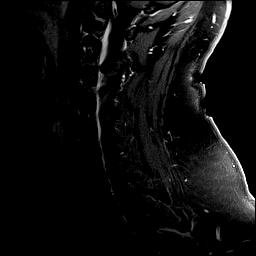
[im 10/15]
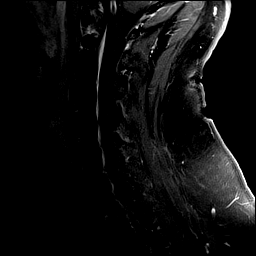
[im 15/15]
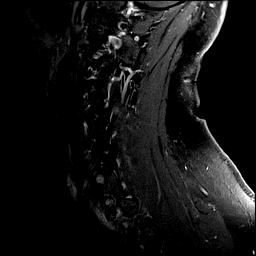

[Series 12: T1 post-contrast · axial · 3.0mm · 0.70mm/px · z∈[-64,+53]mm · 8 of 31 slices shown]
[im 1/31]
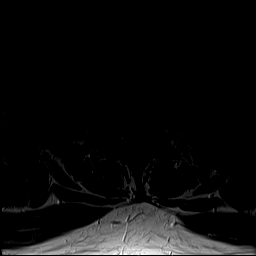
[im 4/31]
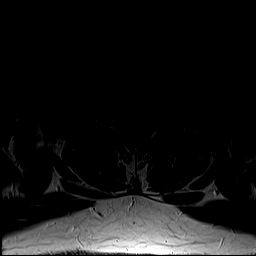
[im 8/31]
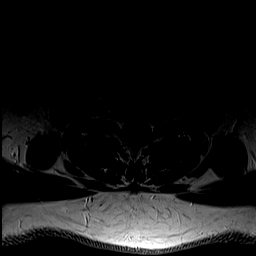
[im 12/31]
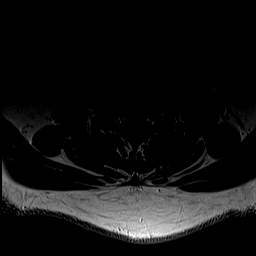
[im 19/31]
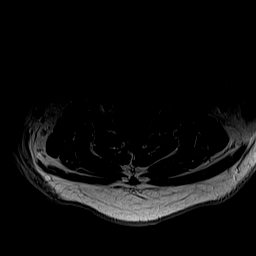
[im 23/31]
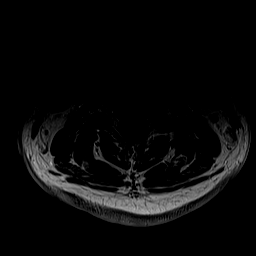
[im 27/31]
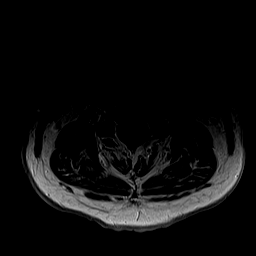
[im 31/31]
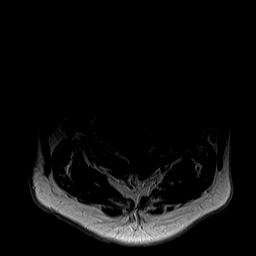

[46 of 48 positions shown; findings below may reference images not displayed]

FINDINGS: No marrow signal abnormality suggestive of fracture, infection, or
neoplasm. Normal cord signal and morphology. No extra-spinal
findings to explain pain. The visible vertebral arteries have normal
flow related signal loss. No abnormal enhancement.

Degenerative changes:

C2-3: Unremarkable.

C3-4: Unremarkable.

C4-5: Unremarkable.

C5-6: Mild bulging of the annulus.  No impingement or herniation.

C6-7: Mild bulging of the annulus.  No impingement or herniation.

C7-T1:Unremarkable.
IMPRESSION: No explanation for left-sided weakness.  No herniation or stenosis.

## 2016-03-01 IMAGING — MR MR HEAD W/O CM
10 series · 48 of 48 positions shown · non-contrast
Comparison: MR head without and with contrast 05/03/2015.  Benign

CLINICAL DATA: LEFT-sided weakness is not improving. Continued
surveillance.

EXAM:
MRI HEAD WITHOUT CONTRAST
TECHNIQUE: Multiplanar, multiecho pulse sequences of the brain and surrounding
structures were obtained without intravenous contrast.

[Series 2: GRE · sagittal · 5.0mm · 0.45mm/px · 4 of 23 slices shown (1 of 2)]
[im 1/23]
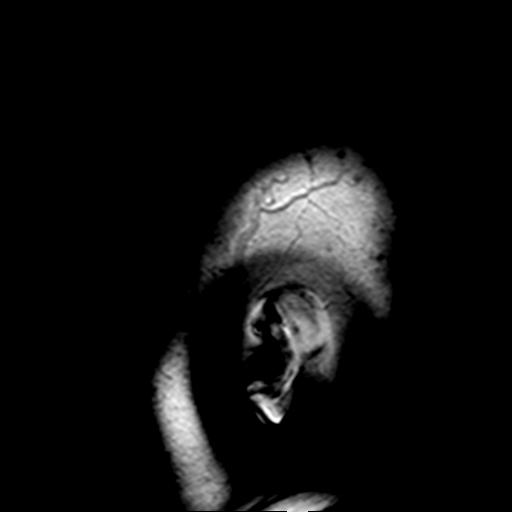
[im 8/23]
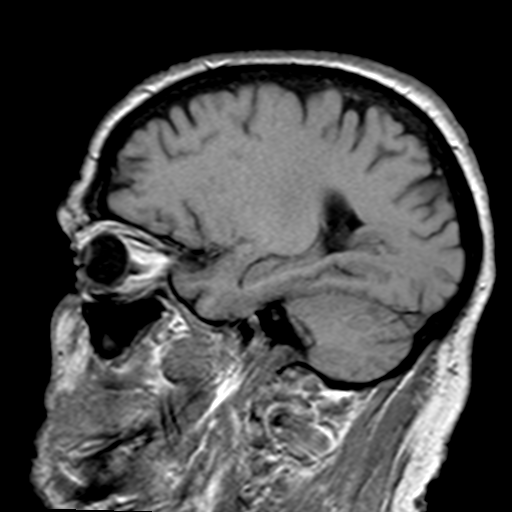
[im 15/23]
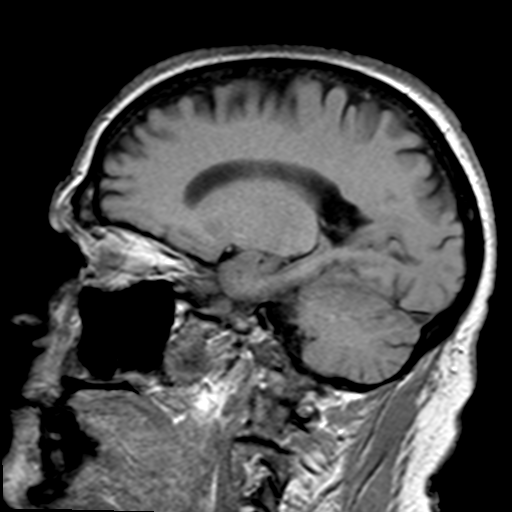
[im 23/23]
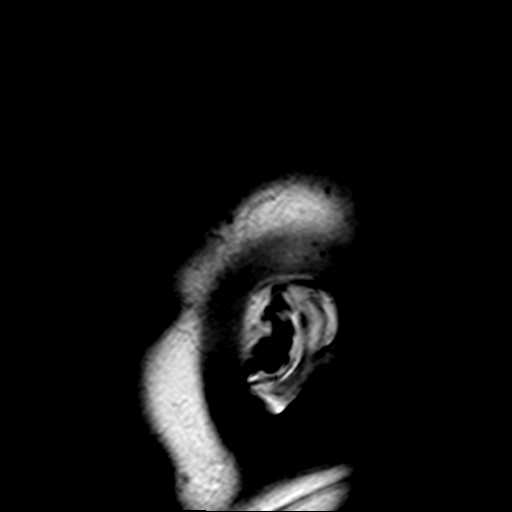

[Series 4: DWI · axial · 3.0mm · 1.80mm/px · z∈[-102,+73]mm · 6 of 43 slices shown (1 of 4)]
[im 1/43]
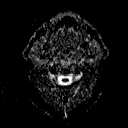
[im 9/43]
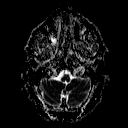
[im 17/43]
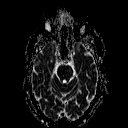
[im 26/43]
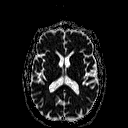
[im 34/43]
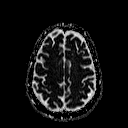
[im 43/43]
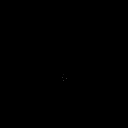

[Series 6: DWI · coronal · 3.0mm · 1.80mm/px · 8 of 66 slices shown (2 of 4)]
[im 1/66]
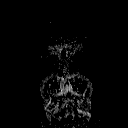
[im 10/66]
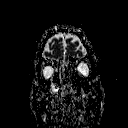
[im 19/66]
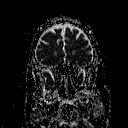
[im 28/66]
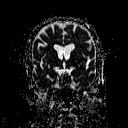
[im 38/66]
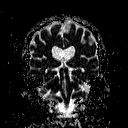
[im 47/66]
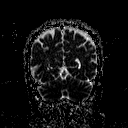
[im 56/66]
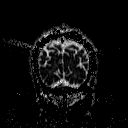
[im 66/66]
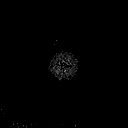

[Series 7: T2 · axial · 5.0mm · 0.45mm/px · z∈[-102,+74]mm · 3 of 28 slices shown (1 of 3)]
[im 1/28]
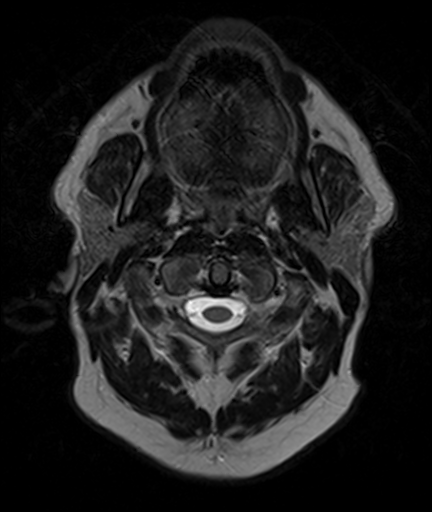
[im 14/28]
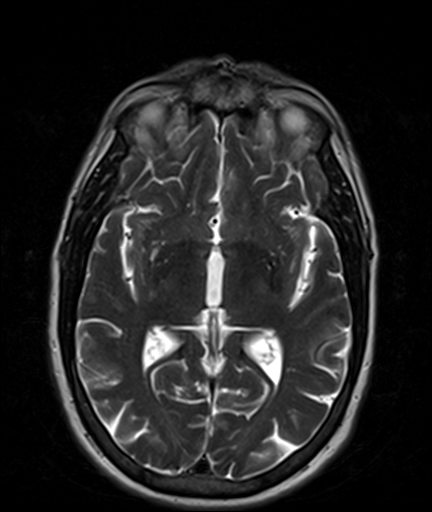
[im 28/28]
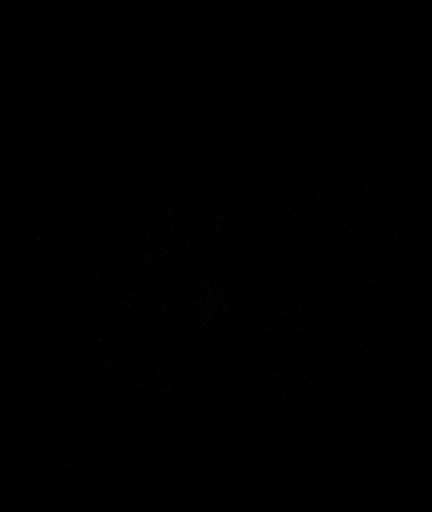

[Series 8: FLAIR · axial · 5.0mm · 0.45mm/px · z∈[-101,+74]mm · 3 of 28 slices shown]
[im 1/28]
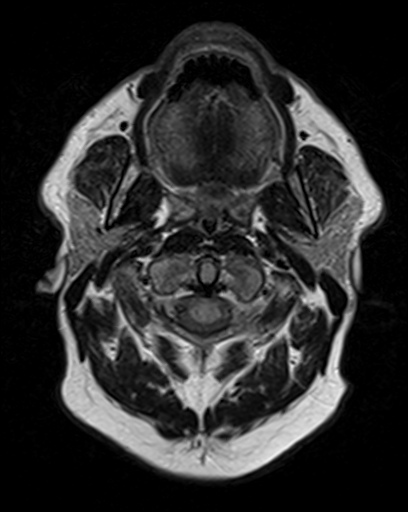
[im 14/28]
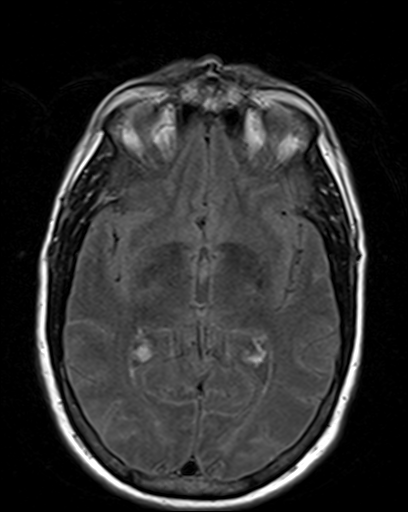
[im 28/28]
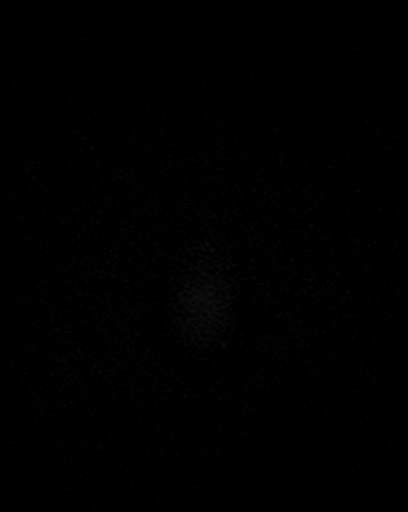

[Series 9: T2 · axial · 5.0mm · 1.20mm/px · z∈[-102,+73]mm · 3 of 28 slices shown (2 of 3)]
[im 1/28]
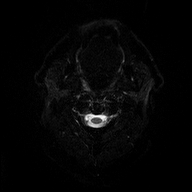
[im 14/28]
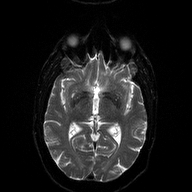
[im 28/28]
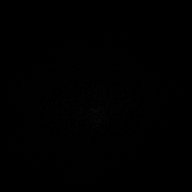

[Series 10: GRE · axial · 5.0mm · 0.45mm/px · z∈[-102,+73]mm · 3 of 28 slices shown (2 of 2)]
[im 1/28]
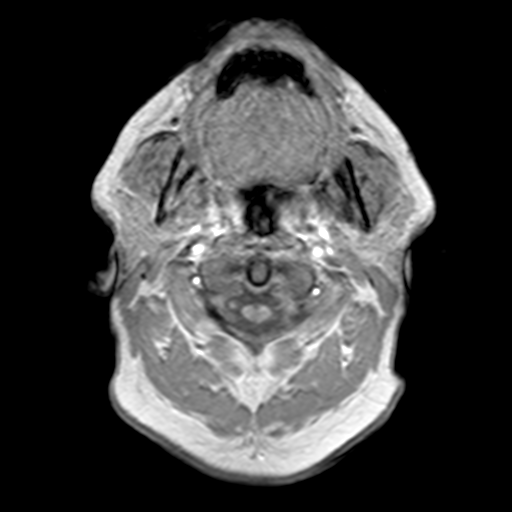
[im 14/28]
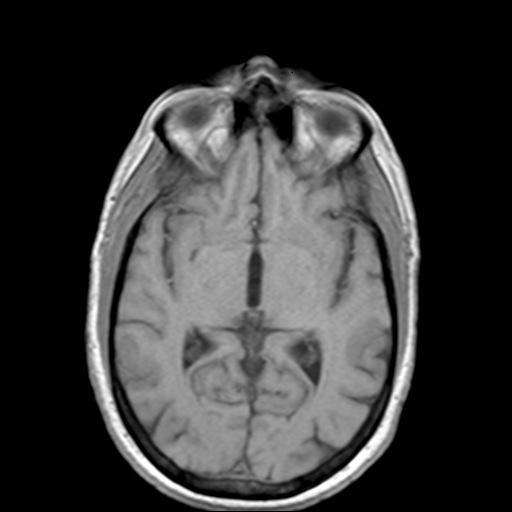
[im 28/28]
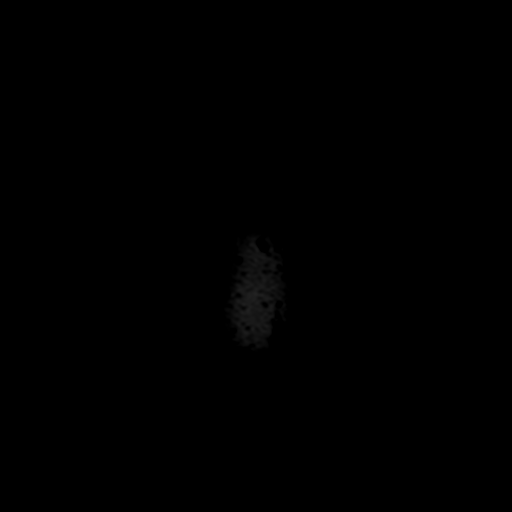

[Series 11: T2 · coronal · 5.0mm · 0.45mm/px · 4 of 31 slices shown (3 of 3)]
[im 1/31]
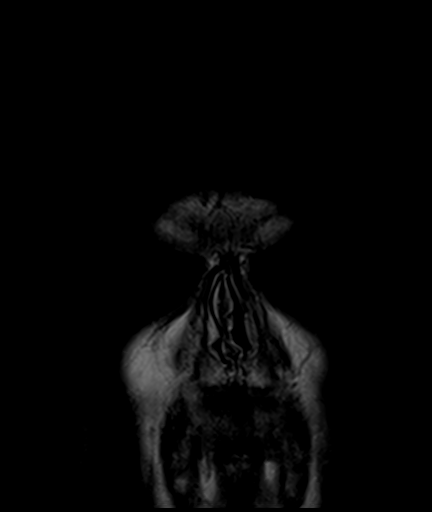
[im 11/31]
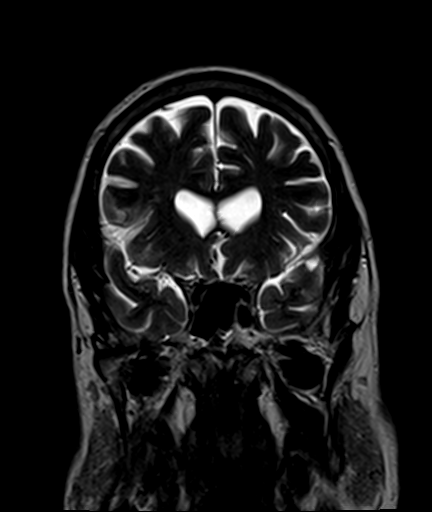
[im 21/31]
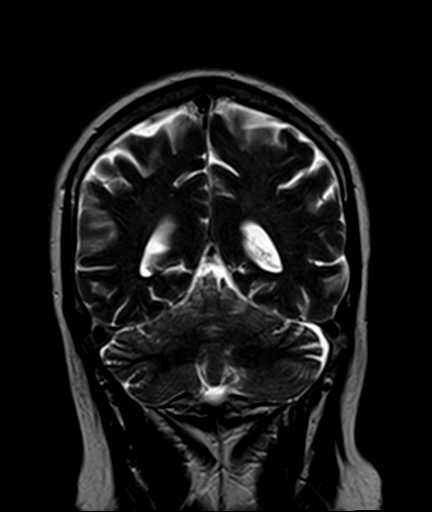
[im 31/31]
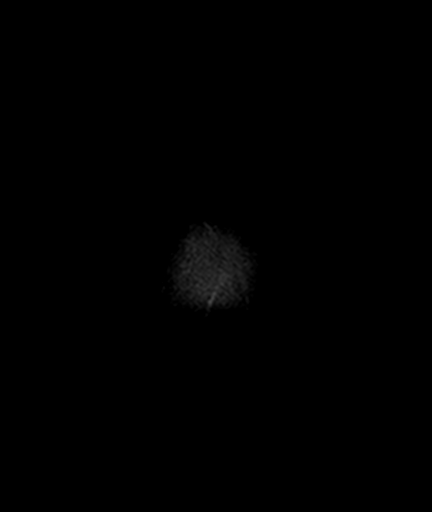

[Series 100: DWI · axial · 3.0mm · 1.80mm/px · z∈[-102,+73]mm · 6 of 46 slices shown (3 of 4)]
[im 1/46]
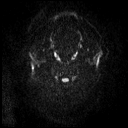
[im 10/46]
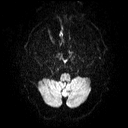
[im 19/46]
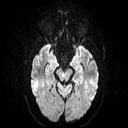
[im 28/46]
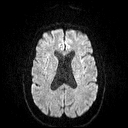
[im 37/46]
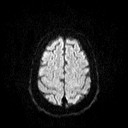
[im 46/46]
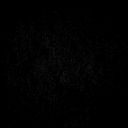

[Series 101: DWI · coronal · 3.0mm · 1.80mm/px · 8 of 66 slices shown (4 of 4)]
[im 1/66]
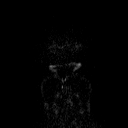
[im 10/66]
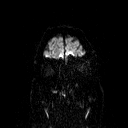
[im 19/66]
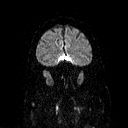
[im 28/66]
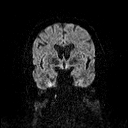
[im 38/66]
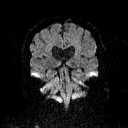
[im 47/66]
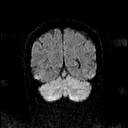
[im 56/66]
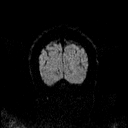
[im 66/66]
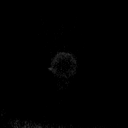

[48 of 48 positions shown; findings below may reference images not displayed]

FINDINGS: Because of claustrophobia, fast brain protocol was used.

No evidence for acute infarction, hemorrhage, mass lesion,
hydrocephalus, or extra-axial fluid. Slight premature for age
cerebral and cerebellar atrophy. Unchanged T2 and FLAIR
hyperintensities throughout the white matter, greater than expected
for age, likely chronic microvascular ischemic change. No signs of
large vessel occlusion. No midline abnormalities. Extracranial soft
tissues remain grossly unremarkable.
IMPRESSION: No interval change from prior study 2 days ago. No acute
intracranial findings.

Slight premature for age atrophy with mild small vessel disease.

## 2016-03-01 IMAGING — MR MR LUMBAR SPINE W/O CM
5 series · 39 of 48 positions shown · non-contrast
Comparison: Radiographs dated 12/02/2014 and lumbar MRI dated
09/18/2012

CLINICAL DATA: Progressive low back pain and left leg weakness.

EXAM:
MRI LUMBAR SPINE WITHOUT CONTRAST
TECHNIQUE: Multiplanar, multisequence MR imaging of the lumbar spine was
performed. No intravenous contrast was administered.

[Series 2: T2 · sagittal · 4.0mm · 0.81mm/px · 6 of 17 slices shown (1 of 2)]
[im 1/17]
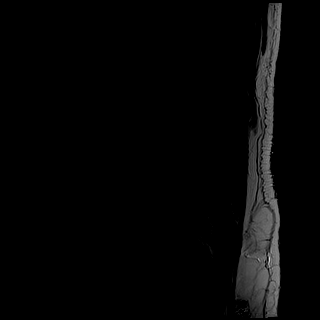
[im 4/17]
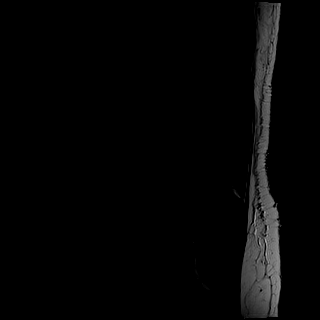
[im 7/17]
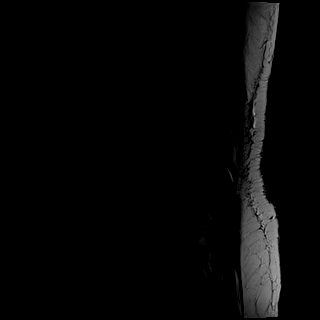
[im 10/17]
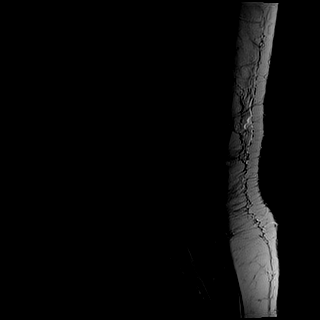
[im 13/17]
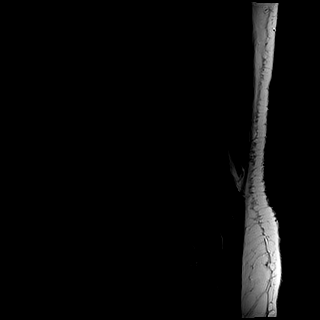
[im 17/17]
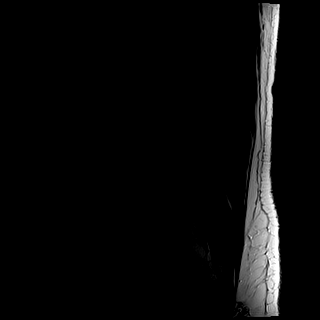

[Series 3: T1 · sagittal · 4.0mm · 0.81mm/px · 6 of 17 slices shown (1 of 2)]
[im 1/17]
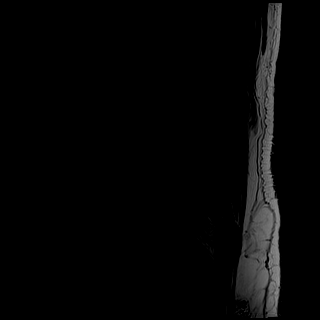
[im 4/17]
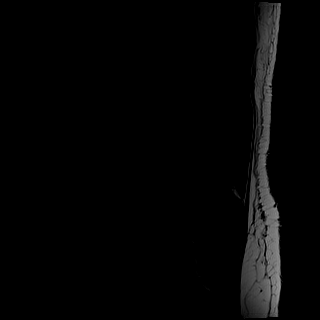
[im 7/17]
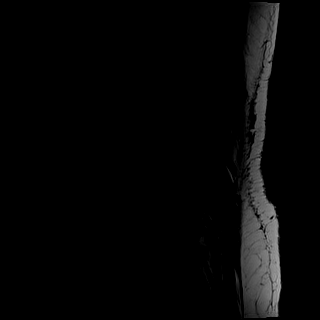
[im 10/17]
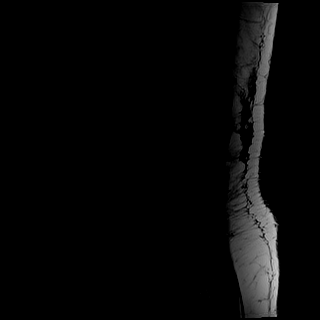
[im 13/17]
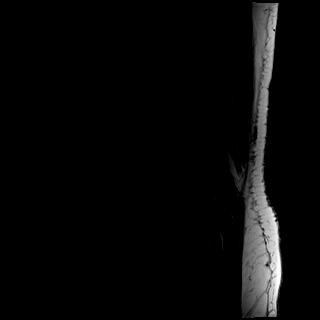
[im 17/17]
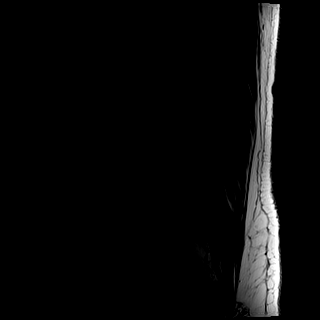

[Series 4: STIR · sagittal · 4.0mm · 1.02mm/px · 6 of 17 slices shown]
[im 1/17]
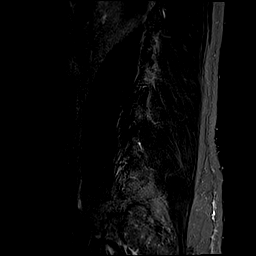
[im 4/17]
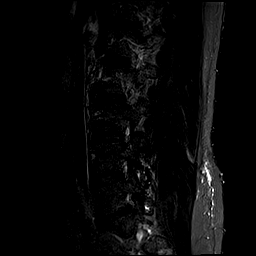
[im 7/17]
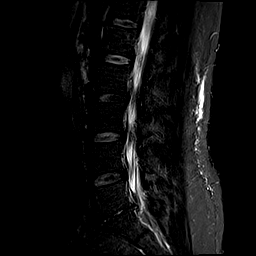
[im 10/17]
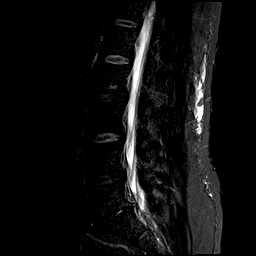
[im 13/17]
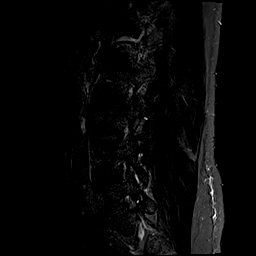
[im 17/17]
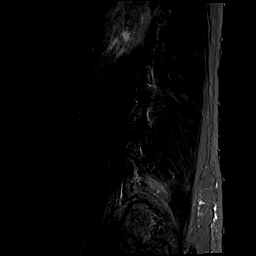

[Series 5: T2 · axial · 4.0mm · 0.78mm/px · z∈[-148,+85]mm · 12 of 46 slices shown (2 of 2)]
[im 1/46]
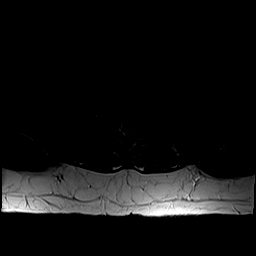
[im 4/46]
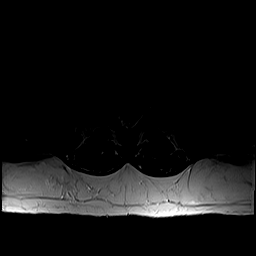
[im 7/46]
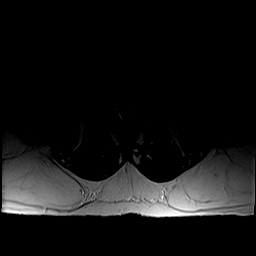
[im 10/46]
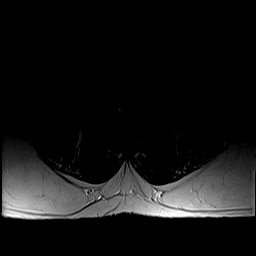
[im 13/46]
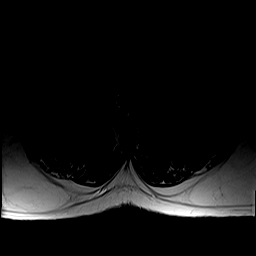
[im 17/46]
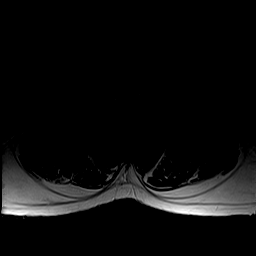
[im 20/46]
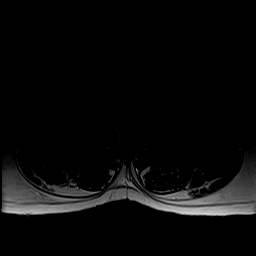
[im 23/46]
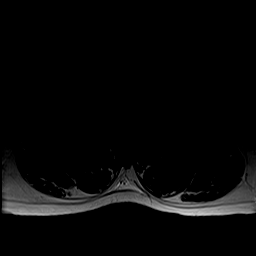
[im 26/46]
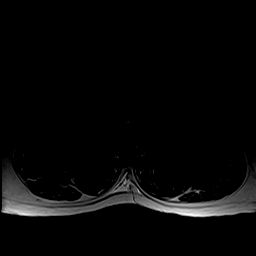
[im 33/46]
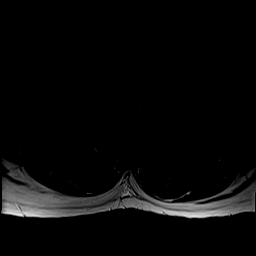
[im 39/46]
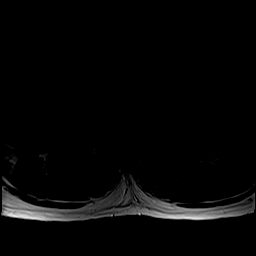
[im 46/46]
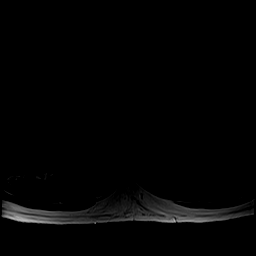

[Series 6: T1 · axial · 4.0mm · 0.39mm/px · z∈[-148,+85]mm · 9 of 46 slices shown (2 of 2)]
[im 1/46]
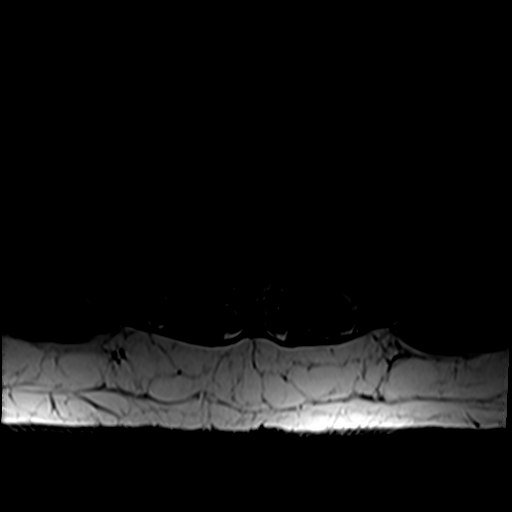
[im 7/46]
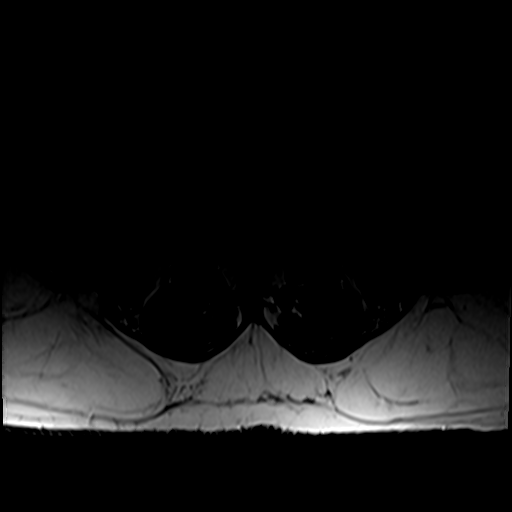
[im 13/46]
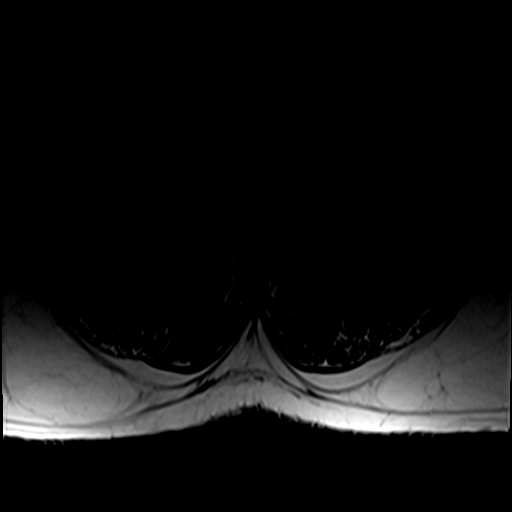
[im 20/46]
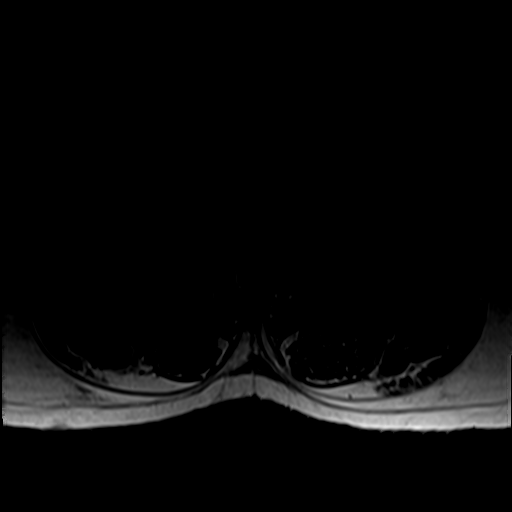
[im 23/46]
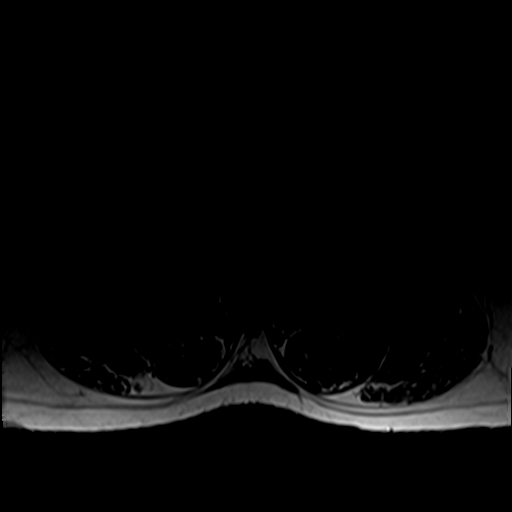
[im 26/46]
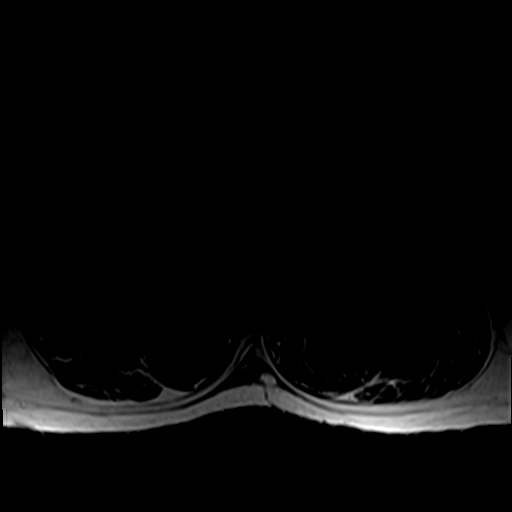
[im 33/46]
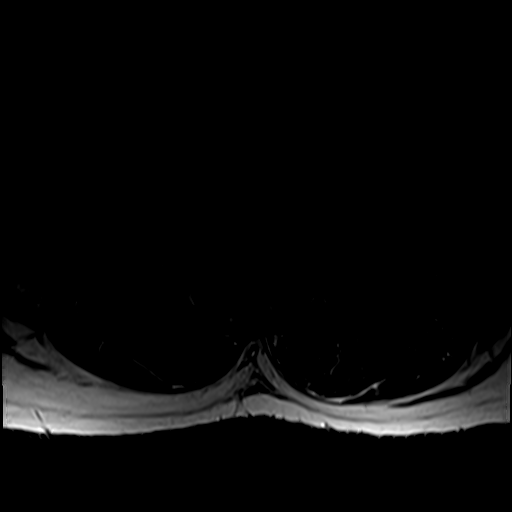
[im 39/46]
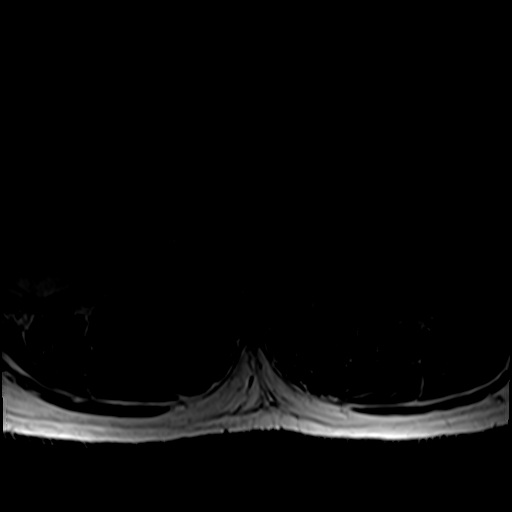
[im 46/46]
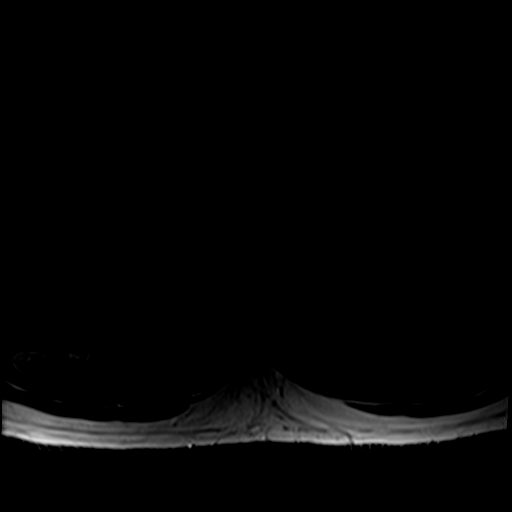

[39 of 48 positions shown; findings below may reference images not displayed]

FINDINGS: Normal conus tip at L1.  Normal paraspinal soft tissues.

T12-L1 and L1-2:  Normal.

L2-3:  Normal.

L3-4: Tiny central disc bulge with no neural impingement, unchanged.

L4-5: Tiny disc bulge with accompanying osteophytes into the left
neural foramen with no neural impingement. No foraminal stenosis.
Minimal degenerative changes of the facet joints, slightly
progressed.

L5-S1: Chronic marked narrowing of the disc space. Small broad-based
endplate osteophytes without neural impingement, unchanged. No
foraminal or spinal stenosis. No facet arthritis.
IMPRESSION: 1. Degenerative disc disease at L5-S1, chronic with no neural
impingement.
2. Minimal bilateral facet arthritis at L4-5, progressed.

## 2016-05-04 IMAGING — CR DG CHEST 1V PORT
1 series · 1 of 1 positions shown · non-contrast
Comparison: Chest radiograph from 05/03/2015

CLINICAL DATA: Acute onset of vomiting. Shooting pain down the left
side of the body. Sternal chest pain. Initial encounter.

EXAM:
PORTABLE CHEST - 1 VIEW

[ap]
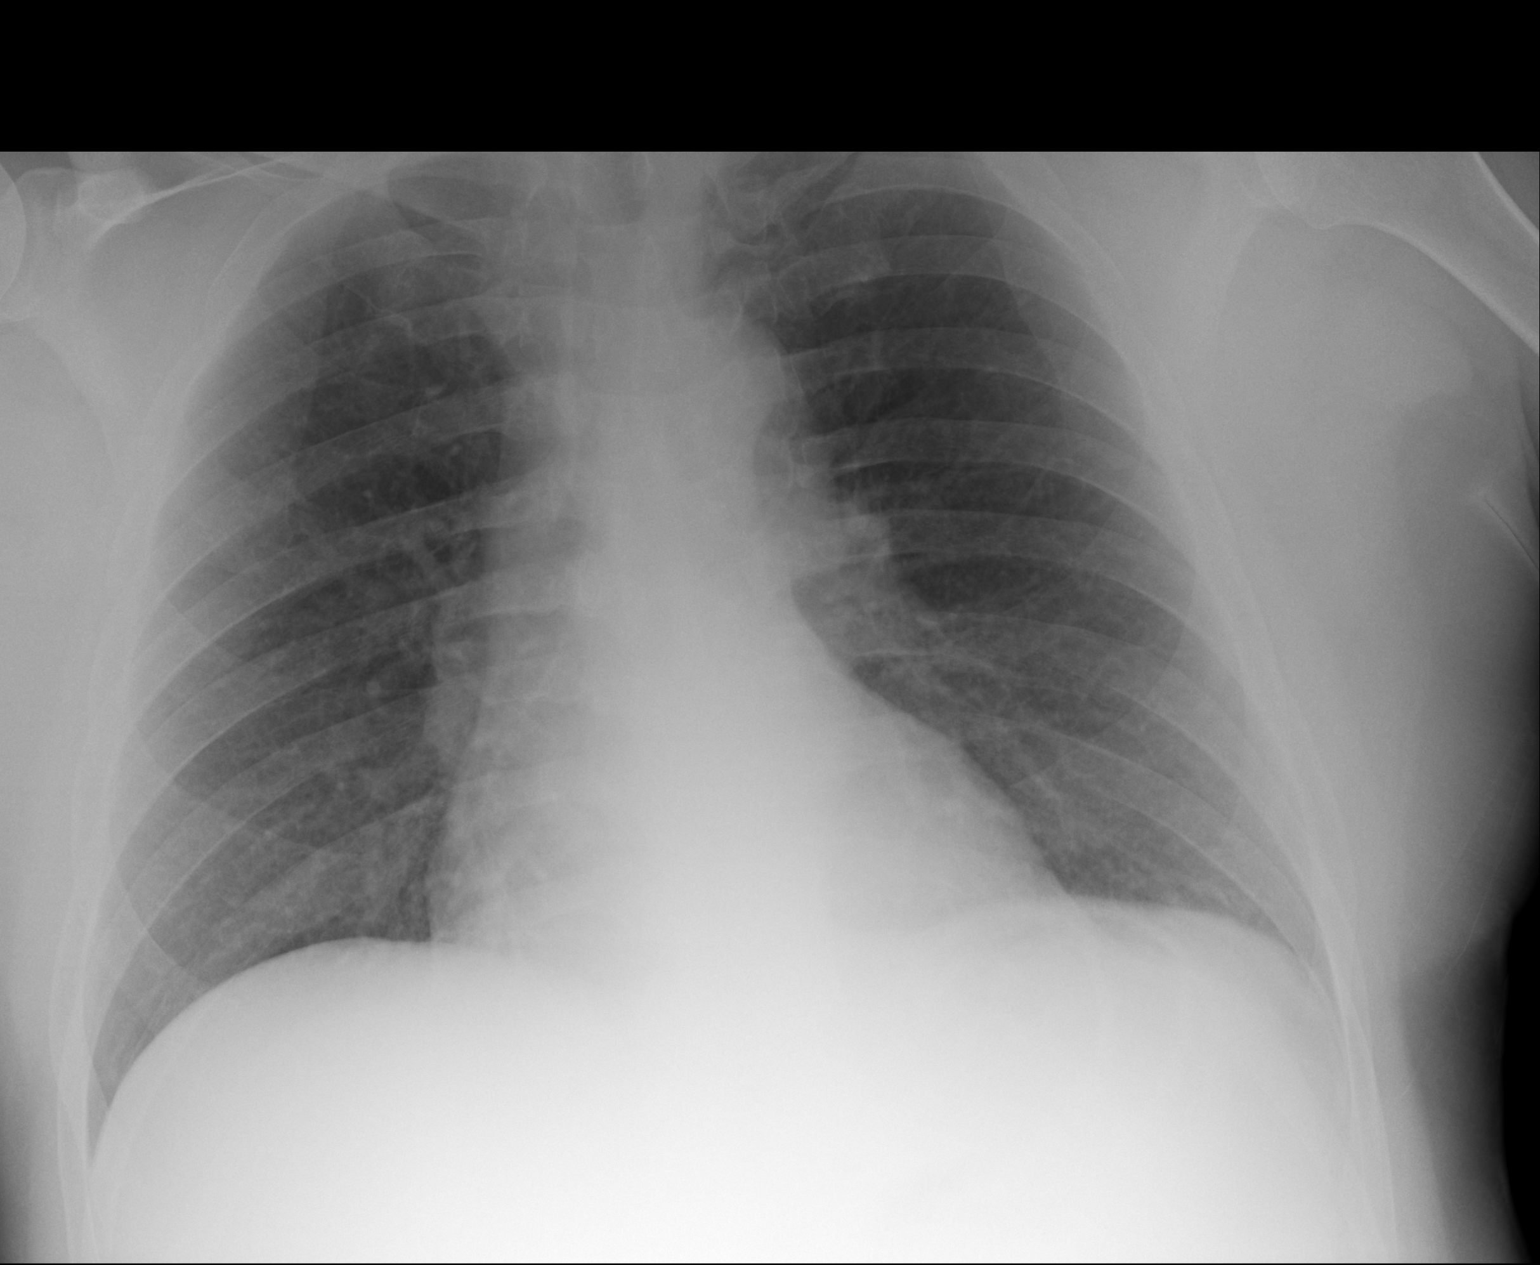

[1 of 1 positions shown; findings below may reference images not displayed]

FINDINGS: The lungs are well-aerated and clear. There is no evidence of focal
opacification, pleural effusion or pneumothorax.

The cardiomediastinal silhouette is within normal limits. No acute
osseous abnormalities are seen.
IMPRESSION: No acute cardiopulmonary process seen.
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
## 2016-06-09 IMAGING — CR DG CHEST 2V
2 series · 3 of 3 positions shown · non-contrast
Comparison: 07/08/2015

CLINICAL DATA: Chest pain.  Smoker.

EXAM:
CHEST  2 VIEW

[Series 2: chest lat · 0.14mm/px · 2 of 2 slices shown]
[im 1/2]
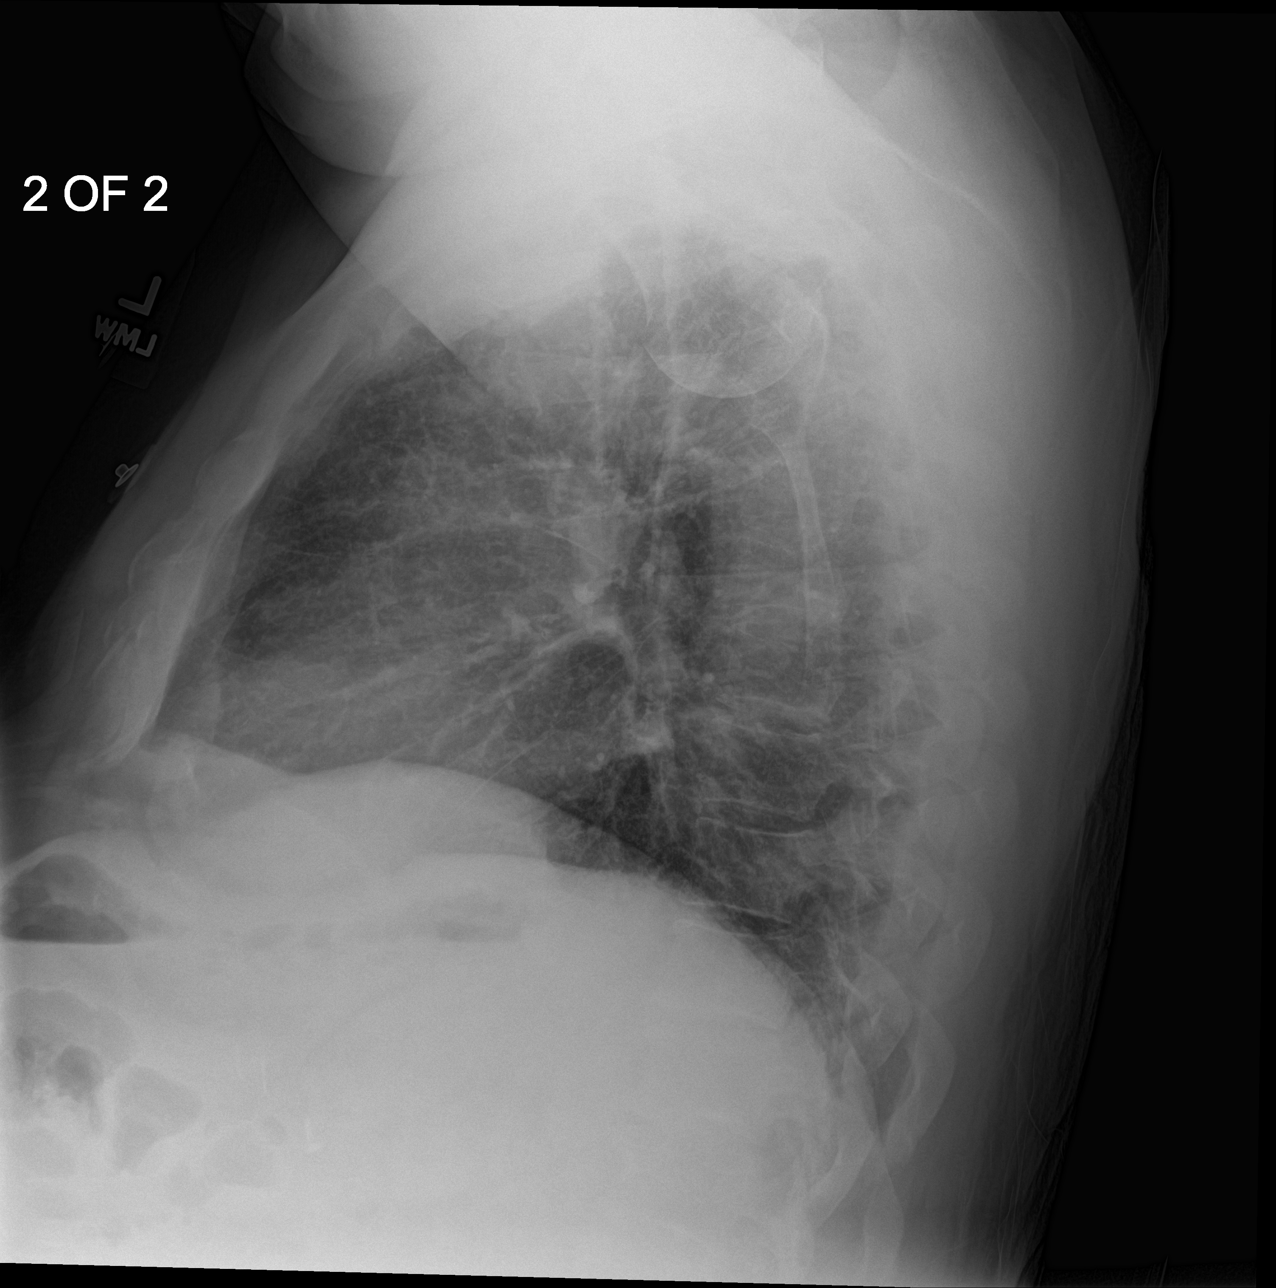
[im 2/2]
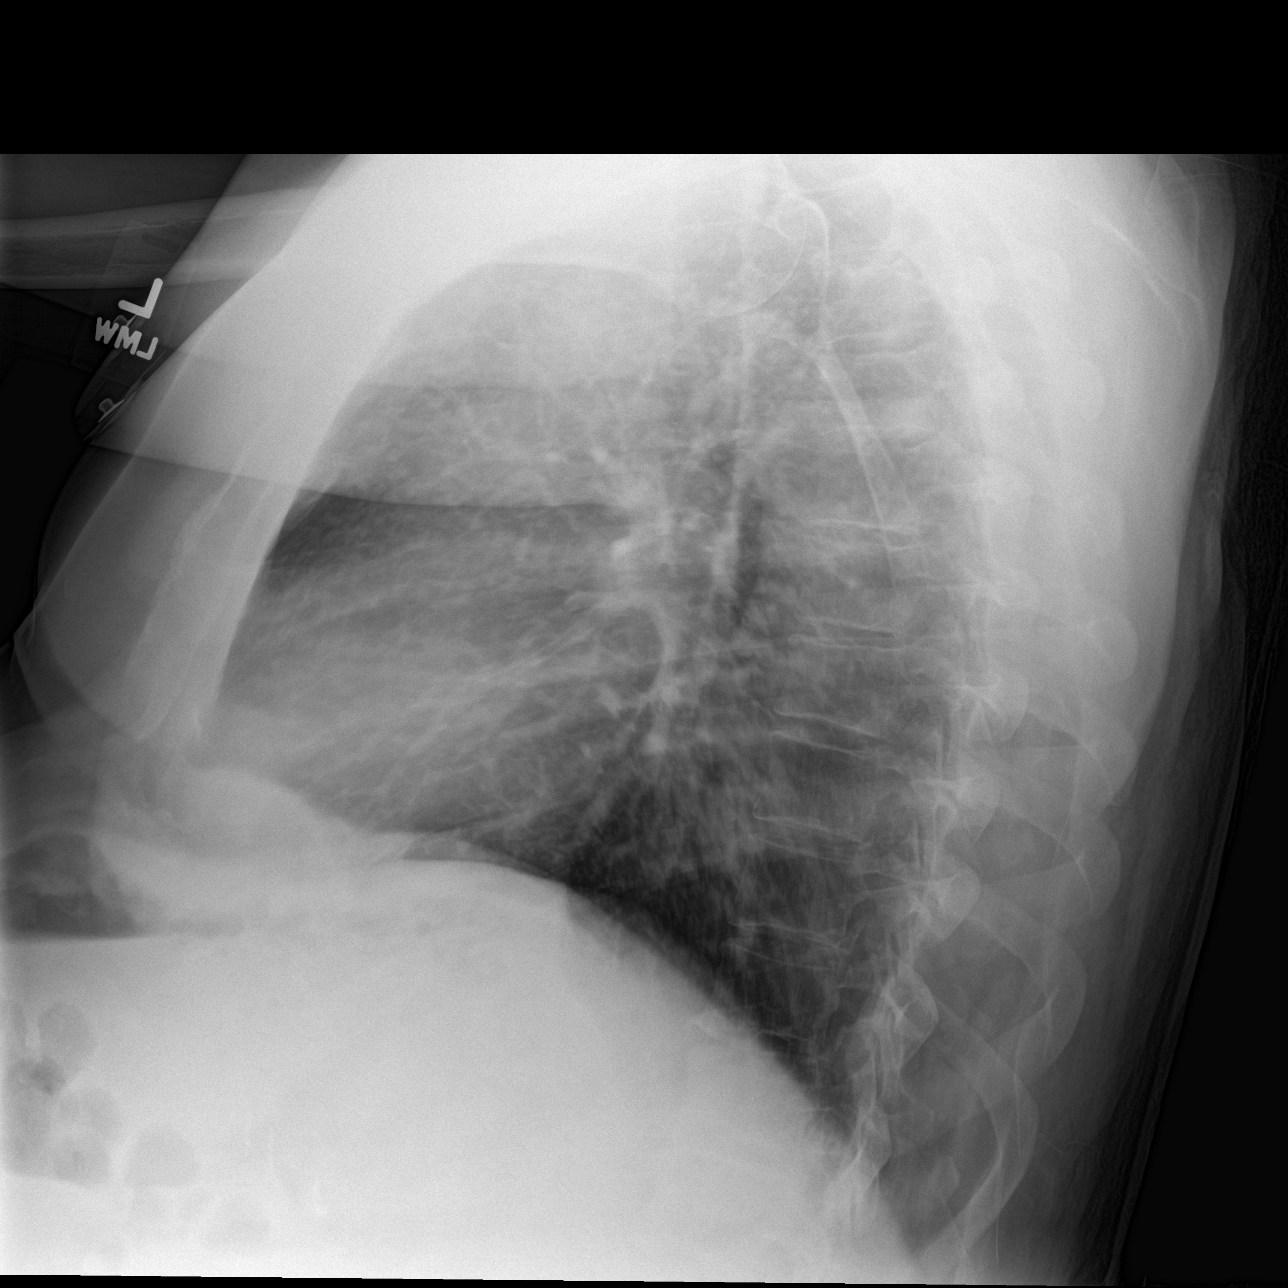

[chest ap]
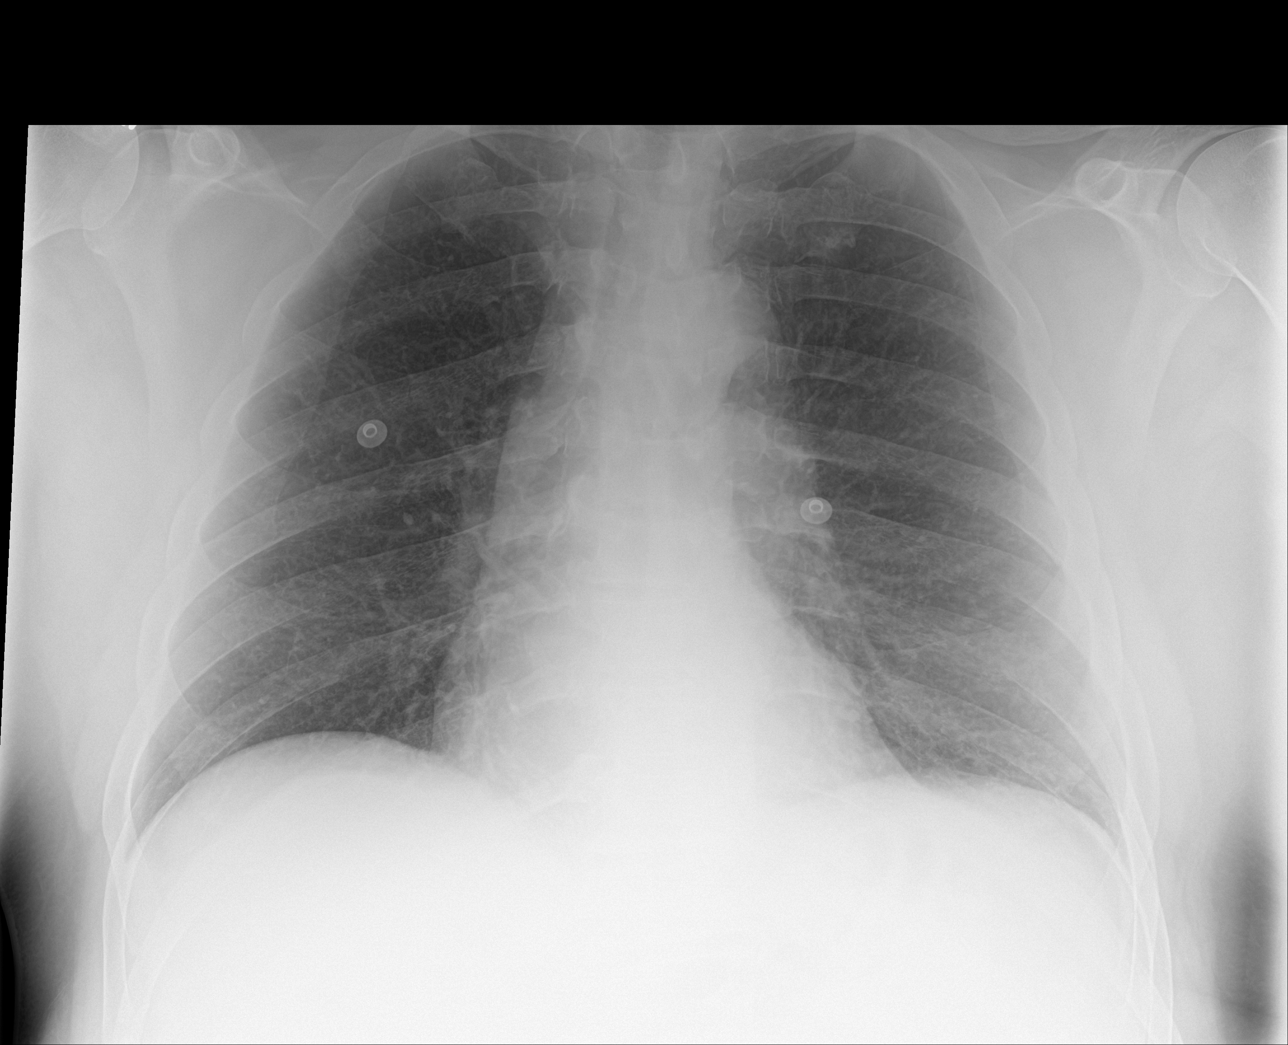

[3 of 3 positions shown; findings below may reference images not displayed]

FINDINGS: The heart size and mediastinal contours are within normal limits.
Both lungs are clear. The visualized skeletal structures are
unremarkable.
IMPRESSION: No active cardiopulmonary disease.

## 2016-08-11 ENCOUNTER — Ambulatory Visit: Payer: Medicaid Other | Admitting: Pain Medicine

## 2016-09-01 ENCOUNTER — Inpatient Hospital Stay: Payer: Medicaid Other

## 2016-09-01 ENCOUNTER — Inpatient Hospital Stay
Admit: 2016-09-01 | Discharge: 2016-09-01 | Disposition: A | Discharge status: Home / Self Care | Payer: Medicaid Other | Attending: Internal Medicine | Admitting: Internal Medicine

## 2016-09-01 ENCOUNTER — Encounter: Payer: Self-pay | Admitting: Internal Medicine

## 2016-09-01 ENCOUNTER — Emergency Department: Payer: Medicaid Other

## 2016-09-01 ENCOUNTER — Inpatient Hospital Stay
Admission: EM | Admit: 2016-09-01 | Discharge: 2016-09-03 | DRG: 092 | Disposition: A | Discharge status: Home / Self Care | Payer: Medicaid Other | Attending: Internal Medicine | Admitting: Internal Medicine

## 2016-09-01 DIAGNOSIS — G459 Transient cerebral ischemic attack, unspecified: Secondary | ICD-10-CM | POA: Diagnosis not present

## 2016-09-01 DIAGNOSIS — I2510 Atherosclerotic heart disease of native coronary artery without angina pectoris: Secondary | ICD-10-CM | POA: Diagnosis present

## 2016-09-01 DIAGNOSIS — R531 Weakness: Secondary | ICD-10-CM | POA: Diagnosis present

## 2016-09-01 DIAGNOSIS — R29702 NIHSS score 2: Secondary | ICD-10-CM | POA: Diagnosis present

## 2016-09-01 DIAGNOSIS — K219 Gastro-esophageal reflux disease without esophagitis: Secondary | ICD-10-CM | POA: Diagnosis present

## 2016-09-01 DIAGNOSIS — I639 Cerebral infarction, unspecified: Secondary | ICD-10-CM | POA: Diagnosis present

## 2016-09-01 DIAGNOSIS — F329 Major depressive disorder, single episode, unspecified: Secondary | ICD-10-CM | POA: Diagnosis present

## 2016-09-01 DIAGNOSIS — M722 Plantar fascial fibromatosis: Secondary | ICD-10-CM | POA: Diagnosis present

## 2016-09-01 DIAGNOSIS — J441 Chronic obstructive pulmonary disease with (acute) exacerbation: Secondary | ICD-10-CM | POA: Diagnosis present

## 2016-09-01 DIAGNOSIS — F17210 Nicotine dependence, cigarettes, uncomplicated: Secondary | ICD-10-CM | POA: Diagnosis present

## 2016-09-01 DIAGNOSIS — Z803 Family history of malignant neoplasm of breast: Secondary | ICD-10-CM | POA: Diagnosis not present

## 2016-09-01 DIAGNOSIS — M79672 Pain in left foot: Secondary | ICD-10-CM

## 2016-09-01 DIAGNOSIS — Z79899 Other long term (current) drug therapy: Secondary | ICD-10-CM

## 2016-09-01 DIAGNOSIS — F419 Anxiety disorder, unspecified: Secondary | ICD-10-CM | POA: Diagnosis present

## 2016-09-01 DIAGNOSIS — R200 Anesthesia of skin: Principal | ICD-10-CM | POA: Diagnosis present

## 2016-09-01 DIAGNOSIS — Z823 Family history of stroke: Secondary | ICD-10-CM

## 2016-09-01 DIAGNOSIS — R079 Chest pain, unspecified: Secondary | ICD-10-CM

## 2016-09-01 DIAGNOSIS — Z8673 Personal history of transient ischemic attack (TIA), and cerebral infarction without residual deficits: Secondary | ICD-10-CM | POA: Diagnosis not present

## 2016-09-01 DIAGNOSIS — I10 Essential (primary) hypertension: Secondary | ICD-10-CM | POA: Diagnosis present

## 2016-09-01 DIAGNOSIS — R202 Paresthesia of skin: Secondary | ICD-10-CM | POA: Diagnosis not present

## 2016-09-01 DIAGNOSIS — Z806 Family history of leukemia: Secondary | ICD-10-CM

## 2016-09-01 HISTORY — DX: Cerebral infarction, unspecified: I63.9

## 2016-09-01 LAB — CBC
HCT: 53 % — ABNORMAL HIGH (ref 40.0–52.0)
Hemoglobin: 18.4 g/dL — ABNORMAL HIGH (ref 13.0–18.0)
MCH: 30.9 pg (ref 26.0–34.0)
MCHC: 34.7 g/dL (ref 32.0–36.0)
MCV: 89 fL (ref 80.0–100.0)
PLATELETS: 216 10*3/uL (ref 150–440)
RBC: 5.95 MIL/uL — AB (ref 4.40–5.90)
RDW: 13.4 % (ref 11.5–14.5)
WBC: 11.4 10*3/uL — AB (ref 3.8–10.6)

## 2016-09-01 LAB — URINALYSIS COMPLETE WITH MICROSCOPIC (ARMC ONLY)
BILIRUBIN URINE: NEGATIVE
Bacteria, UA: NONE SEEN
GLUCOSE, UA: NEGATIVE mg/dL
HGB URINE DIPSTICK: NEGATIVE
KETONES UR: NEGATIVE mg/dL
LEUKOCYTES UA: NEGATIVE
NITRITE: NEGATIVE
Protein, ur: NEGATIVE mg/dL
RBC / HPF: NONE SEEN RBC/hpf (ref 0–5)
SQUAMOUS EPITHELIAL / LPF: NONE SEEN
Specific Gravity, Urine: 1.002 — ABNORMAL LOW (ref 1.005–1.030)
pH: 5 (ref 5.0–8.0)

## 2016-09-01 LAB — BASIC METABOLIC PANEL
ANION GAP: 10 (ref 5–15)
BUN: 7 mg/dL (ref 6–20)
CALCIUM: 8.9 mg/dL (ref 8.9–10.3)
CO2: 21 mmol/L — ABNORMAL LOW (ref 22–32)
Chloride: 104 mmol/L (ref 101–111)
Creatinine, Ser: 0.66 mg/dL (ref 0.61–1.24)
GLUCOSE: 115 mg/dL — AB (ref 65–99)
POTASSIUM: 4.5 mmol/L (ref 3.5–5.1)
SODIUM: 135 mmol/L (ref 135–145)

## 2016-09-01 LAB — TROPONIN I

## 2016-09-01 LAB — VITAMIN B12: Vitamin B-12: 310 pg/mL (ref 180–914)

## 2016-09-01 LAB — PROTIME-INR
INR: 0.97
Prothrombin Time: 12.9 seconds (ref 11.4–15.2)

## 2016-09-01 MED ORDER — QUETIAPINE FUMARATE 25 MG PO TABS
50.0000 mg | ORAL_TABLET | Freq: Every day | ORAL | Status: DC
  Administered 2016-09-01 – 2016-09-03 (×3): 50 mg via ORAL
  Filled 2016-09-01 (×3): qty 2

## 2016-09-01 MED ORDER — PREDNISONE 10 MG PO TABS: 5.0000 mg | ORAL_TABLET | Freq: Every day | ORAL | Status: DC

## 2016-09-01 MED ORDER — ASPIRIN 300 MG RE SUPP
300.0000 mg | Freq: Once | RECTAL | Status: AC
  Administered 2016-09-01: 300 mg via RECTAL
  Filled 2016-09-01: qty 1

## 2016-09-01 MED ORDER — QUETIAPINE FUMARATE 200 MG PO TABS: 300.0000 mg | ORAL_TABLET | Freq: Every day | ORAL | Status: DC

## 2016-09-01 MED ORDER — ENOXAPARIN SODIUM 40 MG/0.4ML ~~LOC~~ SOLN
40.0000 mg | SUBCUTANEOUS | Status: DC
  Administered 2016-09-01 – 2016-09-02 (×2): 40 mg via SUBCUTANEOUS
  Filled 2016-09-01 (×2): qty 0.4

## 2016-09-01 MED ORDER — NICOTINE 21 MG/24HR TD PT24
21.0000 mg | MEDICATED_PATCH | Freq: Every day | TRANSDERMAL | Status: DC
  Administered 2016-09-01 – 2016-09-03 (×3): 21 mg via TRANSDERMAL
  Filled 2016-09-01 (×3): qty 1

## 2016-09-01 MED ORDER — ACETAMINOPHEN 325 MG PO TABS
650.0000 mg | ORAL_TABLET | Freq: Four times a day (QID) | ORAL | Status: DC | PRN
Start: 2016-09-01 — End: 2016-09-03

## 2016-09-01 MED ORDER — PREDNISONE 20 MG PO TABS
40.0000 mg | ORAL_TABLET | Freq: Every day | ORAL | Status: AC
  Administered 2016-09-03: 40 mg via ORAL
  Filled 2016-09-01: qty 2

## 2016-09-01 MED ORDER — SODIUM CHLORIDE 0.9 % IV SOLN: INTRAVENOUS | Status: DC

## 2016-09-01 MED ORDER — ASPIRIN EC 81 MG PO TBEC
81.0000 mg | DELAYED_RELEASE_TABLET | Freq: Every day | ORAL | Status: DC
  Administered 2016-09-02 – 2016-09-03 (×2): 81 mg via ORAL
  Filled 2016-09-01 (×2): qty 1

## 2016-09-01 MED ORDER — PREDNISONE 10 MG PO TABS
10.0000 mg | ORAL_TABLET | Freq: Every day | ORAL | Status: DC
Start: 2016-09-06 — End: 2016-09-03

## 2016-09-01 MED ORDER — PREDNISONE 20 MG PO TABS
50.0000 mg | ORAL_TABLET | Freq: Every day | ORAL | Status: AC
  Administered 2016-09-02: 08:00:00 50 mg via ORAL
  Filled 2016-09-01: qty 2

## 2016-09-01 MED ORDER — GABAPENTIN 100 MG PO CAPS: 100.0000 mg | ORAL_CAPSULE | Freq: Three times a day (TID) | ORAL | Status: DC

## 2016-09-01 MED ORDER — OXYCODONE HCL 5 MG PO TABS
10.0000 mg | ORAL_TABLET | ORAL | Status: DC | PRN
  Administered 2016-09-01 – 2016-09-02 (×5): 10 mg via ORAL
  Filled 2016-09-01 (×5): qty 2

## 2016-09-01 MED ORDER — HYDROXYZINE HCL 10 MG PO TABS: 50.0000 mg | ORAL_TABLET | Freq: Two times a day (BID) | ORAL | Status: DC | PRN

## 2016-09-01 MED ORDER — QUETIAPINE FUMARATE 200 MG PO TABS
400.0000 mg | ORAL_TABLET | Freq: Every day | ORAL | Status: DC
  Administered 2016-09-01 – 2016-09-02 (×2): 400 mg via ORAL
  Filled 2016-09-01 (×2): qty 2

## 2016-09-01 MED ORDER — ALBUTEROL SULFATE HFA 108 (90 BASE) MCG/ACT IN AERS: 1.0000 | INHALATION_SPRAY | Freq: Four times a day (QID) | RESPIRATORY_TRACT | Status: DC | PRN

## 2016-09-01 MED ORDER — SENNOSIDES-DOCUSATE SODIUM 8.6-50 MG PO TABS: 1.0000 | ORAL_TABLET | Freq: Every evening | ORAL | Status: DC | PRN

## 2016-09-01 MED ORDER — ALBUTEROL SULFATE (2.5 MG/3ML) 0.083% IN NEBU: 2.5000 mg | INHALATION_SOLUTION | Freq: Four times a day (QID) | RESPIRATORY_TRACT | Status: DC | PRN

## 2016-09-01 MED ORDER — METHYLPREDNISOLONE SODIUM SUCC 125 MG IJ SOLR
125.0000 mg | Freq: Once | INTRAMUSCULAR | Status: AC
  Administered 2016-09-01: 125 mg via INTRAVENOUS
  Filled 2016-09-01: qty 2

## 2016-09-01 MED ORDER — LISINOPRIL 10 MG PO TABS
10.0000 mg | ORAL_TABLET | Freq: Every day | ORAL | Status: DC
  Administered 2016-09-01 – 2016-09-03 (×3): 10 mg via ORAL
  Filled 2016-09-01 (×3): qty 1

## 2016-09-01 MED ORDER — PANTOPRAZOLE SODIUM 40 MG PO TBEC
40.0000 mg | DELAYED_RELEASE_TABLET | Freq: Every day | ORAL | Status: DC
  Administered 2016-09-01 – 2016-09-03 (×3): 40 mg via ORAL
  Filled 2016-09-01 (×3): qty 1

## 2016-09-01 MED ORDER — CLONAZEPAM 0.5 MG PO TABS
0.5000 mg | ORAL_TABLET | Freq: Two times a day (BID) | ORAL | Status: DC | PRN
  Administered 2016-09-02 – 2016-09-03 (×2): 0.5 mg via ORAL
  Filled 2016-09-01 (×2): qty 1

## 2016-09-01 MED ORDER — PREDNISONE 20 MG PO TABS: 20.0000 mg | ORAL_TABLET | Freq: Every day | ORAL | Status: DC

## 2016-09-01 MED ORDER — CITALOPRAM HYDROBROMIDE 20 MG PO TABS
20.0000 mg | ORAL_TABLET | Freq: Every day | ORAL | Status: DC
  Administered 2016-09-01 – 2016-09-02 (×2): 20 mg via ORAL
  Filled 2016-09-01 (×2): qty 1

## 2016-09-01 MED ORDER — STROKE: EARLY STAGES OF RECOVERY BOOK
Freq: Once | Status: AC
  Administered 2016-09-01: 10:00:00

## 2016-09-01 MED ORDER — PREDNISONE 20 MG PO TABS: 30.0000 mg | ORAL_TABLET | Freq: Every day | ORAL | Status: DC

## 2016-09-01 NOTE — H&P (Signed)
Ian [name] is an 55 y.o. male.   Chief Complaint: Weakness HPI: The patient with past medical history of cerebrovascular accident and coronary artery disease presents emergency department complaining of new onset numbness and weakness of his right side. The patient states that his right leg feels so weak that he is barely able to walk. In the emergency department CT of the head showed no acute abnormalities but the patient had persistent subtle weakness of his right upper extremity and right lower extremity which prompted emergency department staff to call for admission.  Past Medical History:  Diagnosis Date  . Anxiety   . Barrett esophagus   . Cancer   . Coronary artery disease   . Depression   . GERD (gastroesophageal reflux disease)   . Hypertension     Past Surgical History:  Procedure Laterality Date  . ANGIOPLASTY    . APPENDECTOMY    . CARDIAC CATHETERIZATION    . CHOLECYSTECTOMY      Family History  Problem Relation Age of Onset  . Stroke Father   . Leukemia Father   . Breast cancer Sister    Social History:  reports that he has been smoking Cigarettes.  He has been smoking about 1.00 pack per day. He does not have any smokeless tobacco history on file. He reports that he drinks alcohol. He reports that he uses drugs, including Cocaine and Marijuana.  Allergies:  Allergies  Allergen Reactions  . Fluoxetine Swelling    Anxiety, itching  . Hydrocodone-Acetaminophen Itching     (Not in a hospital admission)  Results for orders placed or performed during the hospital encounter of 09/01/16 (from the past 48 hour(s))  CBC     Status: Abnormal   Collection Time: 09/01/16  1:03 AM  Result Value Ref Range   WBC 11.4 (H) 3.8 - 10.6 K/uL   RBC 5.95 (H) 4.40 - 5.90 MIL/uL   Hemoglobin 18.4 (H) 13.0 - 18.0 g/dL   HCT 53.0 (H) 40.0 - 52.0 %   MCV 89.0 80.0 - 100.0 fL   MCH 30.9 26.0 - 34.0 pg   MCHC 34.7 32.0 - 36.0 g/dL   RDW 13.4 11.5 - 14.5 %   Platelets 216  150 - 440 K/uL  Basic metabolic panel     Status: Abnormal   Collection Time: 09/01/16  1:03 AM  Result Value Ref Range   Sodium 135 135 - 145 mmol/L   Potassium 4.5 3.5 - 5.1 mmol/L   Chloride 104 101 - 111 mmol/L   CO2 21 (L) 22 - 32 mmol/L   Glucose, Bld 115 (H) 65 - 99 mg/dL   BUN 7 6 - 20 mg/dL   Creatinine, Ser 0.66 0.61 - 1.24 mg/dL   Calcium 8.9 8.9 - 10.3 mg/dL   GFR calc non Af Amer >60 >60 mL/min   GFR calc Af Amer >60 >60 mL/min    Comment: (NOTE) The eGFR has been calculated using the CKD EPI equation. This calculation has not been validated in all clinical situations. eGFR's persistently <60 mL/min signify possible Chronic Kidney Disease.    Anion gap 10 5 - 15  Urinalysis complete, with microscopic ([facility] only)     Status: Abnormal   Collection Time: 09/01/16  1:03 AM  Result Value Ref Range   Color, Urine STRAW (A) YELLOW   APPearance CLEAR (A) CLEAR   Glucose, UA NEGATIVE NEGATIVE mg/dL   Bilirubin Urine NEGATIVE NEGATIVE   Ketones, ur NEGATIVE NEGATIVE mg/dL  Specific Gravity, Urine 1.002 (L) 1.005 - 1.030   Hgb urine dipstick NEGATIVE NEGATIVE   pH 5.0 5.0 - 8.0   Protein, ur NEGATIVE NEGATIVE mg/dL   Nitrite NEGATIVE NEGATIVE   Leukocytes, UA NEGATIVE NEGATIVE   RBC / HPF NONE SEEN 0 - 5 RBC/hpf   WBC, UA 0-5 0 - 5 WBC/hpf   Bacteria, UA NONE SEEN NONE SEEN   Squamous Epithelial / LPF NONE SEEN NONE SEEN   Mucous PRESENT    Ct Head Wo Contrast  Result Date: 09/01/2016 CLINICAL DATA:  Numbness and tingling for several hours, uncertain of symptom onset time. EXAM: CT HEAD WITHOUT CONTRAST TECHNIQUE: Contiguous axial images were obtained from the base of the skull through the vertex without intravenous contrast. COMPARISON:  Head CT 07/13/2015 FINDINGS: Brain: No evidence of acute infarction, hemorrhage, hydrocephalus, extra-axial collection or mass lesion/mass effect. Vascular: No hyperdense vessel or unexpected calcification. Atherosclerosis of skullbase  vasculature. Skull: Normal. Negative for fracture or focal lesion. Sinuses/Orbits: Chronic paranasal sinus inflammation, stable from prior. No fluid level. Mastoid air cells are clear. IMPRESSION: No acute intracranial abnormality. Electronically Signed   By: Jeb Levering M.D.   On: 09/01/2016 03:05   Dg Foot Complete Left  Result Date: 09/01/2016 CLINICAL DATA:  Severe left heel pain, nontraumatic EXAM: LEFT FOOT - COMPLETE 3+ VIEW COMPARISON:  None. FINDINGS: Negative for acute fracture or dislocation. There is a plantar calcaneal spur. No significant arthritic changes are evident. There is no bone lesion or bony destruction. No radiopaque foreign body. IMPRESSION: Plantar calcaneal spur. Electronically Signed   By: Andreas Newport M.D.   On: 09/01/2016 05:39    Review of Systems  Constitutional: Negative for chills and fever.  HENT: Negative for sore throat and tinnitus.   Eyes: Negative for blurred vision and redness.  Respiratory: Negative for cough and shortness of breath.   Cardiovascular: Negative for chest pain, palpitations, orthopnea and PND.  Gastrointestinal: Negative for abdominal pain, diarrhea, nausea and vomiting.  Genitourinary: Negative for dysuria, frequency and urgency.  Musculoskeletal: Negative for joint pain and myalgias.  Skin: Negative for rash.       No lesions  Neurological: Positive for focal weakness. Negative for speech change and weakness.  Endo/Heme/Allergies: Does not bruise/bleed easily.       No temperature intolerance  Psychiatric/Behavioral: Negative for depression and suicidal ideas.    Blood pressure 115/81, pulse 87, temperature 98.1 F (36.7 C), resp. rate 20, height 6' (1.829 m), weight 117.9 kg (260 lb), SpO2 93 %. Physical Exam  Constitutional: He is oriented to person, place, and time. He appears well-developed and well-nourished. No distress.  HENT:  Head: Normocephalic and atraumatic.  Mouth/Throat: Oropharynx is clear and moist.   Eyes: Conjunctivae and EOM are normal. Pupils are equal, round, and reactive to light. No scleral icterus.  Neck: Normal range of motion. Neck supple. No JVD present. No tracheal deviation present. No thyromegaly present.  Cardiovascular: Normal rate, regular rhythm and normal heart sounds.  Exam reveals no gallop and no friction rub.   No murmur heard. Respiratory: Effort normal and breath sounds normal. No respiratory distress.  GI: Soft. Bowel sounds are normal. He exhibits no distension. There is no tenderness.  Genitourinary:  Genitourinary Comments: Deferred  Musculoskeletal: Normal range of motion. He exhibits no edema.  Lymphadenopathy:    He has no cervical adenopathy.  Neurological: He is alert and oriented to person, place, and time. No cranial nerve deficit.  Skin: Skin is warm  and dry. No rash noted. No erythema.  Psychiatric: He has a normal mood and affect. His behavior is normal. Judgment and thought content normal.     Assessment/Plan This is a 55 year old male admitted for CVA. 1. CVA: New onset right sided weakness. Findings on physical exam are subtle. CT head negative. Obtain MRI MRA of brain and intracranial vessels. Carotid ultrasounds ordered. No bruits on physical exam. Neurology has been consulted. Physical and occupational therapy to assess needs for outpatient ambulation and safety. 2. Hypertension: Controlled; continue lisinopril 3. Coronary artery disease: Stable; continue aspirin 4. Wheezing: Bilateral; oxygen saturations normal on room air but the patient has increased work of breathing. Solu-Medrol and breathing treatments. Taper steroids. 5. Tobacco abuse: Ongoing. NicoDerm patch while hospitalized 6. DVT prophylaxis: Lovenox 7. GI prophylaxis: None The patient is a full code. Time spent on admission orders and patient care approximately 45 minutes   Harrie Foreman, MD 09/01/2016, 6:55 AM

## 2016-09-01 NOTE — Evaluation (Signed)
Clinical/Bedside Swallow Evaluation Patient Details  Name: Ian [name] MRN: [id number] Date of Birth: April 05, 1961  Today's Date: 09/01/2016 Time: SLP Start Time (ACUTE ONLY): 71 SLP Stop Time (ACUTE ONLY): 1040 SLP Time Calculation (min) (ACUTE ONLY): 60 min  Past Medical History:  Past Medical History:  Diagnosis Date  . Anxiety   . Barrett esophagus   . Cancer ([place])   . Coronary artery disease   . Depression   . GERD (gastroesophageal reflux disease)   . Hypertension    Past Surgical History:  Past Surgical History:  Procedure Laterality Date  . ANGIOPLASTY    . APPENDECTOMY    . CARDIAC CATHETERIZATION    . CHOLECYSTECTOMY     HPI:      Assessment / Plan / Recommendation Clinical Impression  Pt appeared to adequately tolerate trials of soft solids (moistened) and thin liquids via cup and straw w/ no overt s/s of aspiration noted; no decline in respirtory status or decline in vocal quality during/post trials. Oral phase adequate for softened foods d/t missing dentition. Pt able to feed self w/ setup assist. Pt appears at reduced risk for aspiration following general aspiration precautions. Pt disclosed he had Barrett's Esophagus and that this condition results in Esophageal dysmotility and difficulty swallowing if he doesn not monitor his intake(such as meats and breads). Briefly discussed general Reflux precautions he follows. NSG to reconsult if any further skilled ST services indicated while admitted.     Aspiration Risk   (reduced from an oropharyngeal phase standpoint)    Diet Recommendation   Dysphagia 3(mech soft moistened), Thin liquids; general aspiration and Reflux precautions.   Medication Administration: Whole meds with liquid    Other  Recommendations Oral Care Recommendations: Oral care BID;Staff/trained caregiver to provide oral care   Follow up Recommendations  None    Frequency and Duration            Prognosis Prognosis for Safe Diet  Advancement: Good      Swallow Study   General Date of Onset: 09/01/16 Type of Study: Bedside Swallow Evaluation Previous Swallow Assessment: none Diet Prior to this Study: Regular;Thin liquids Temperature Spikes Noted: No (wbc 11.4) Respiratory Status: Room air History of Recent Intubation: No Behavior/Cognition: Alert;Cooperative;Pleasant mood Oral Cavity Assessment: Within Functional Limits;Dry Oral Care Completed by SLP: No (in ED) Oral Cavity - Dentition: Dentures, top (no lower dentition) Vision: Functional for self-feeding Self-Feeding Abilities: Able to feed self;Needs set up (min. R hand shakiness and tingling; as needed) Patient Positioning: Upright in bed Baseline Vocal Quality: Normal Volitional Cough: Strong Volitional Swallow: Able to elicit    Oral/Motor/Sensory Function Overall Oral Motor/Sensory Function: Within functional limits (c/o slight R facial tingling and "stinging")   Ice Chips Ice chips: Within functional limits Presentation: Spoon (fed; 3 trials)   Thin Liquid Thin Liquid: Within functional limits Presentation: Cup;Self Fed;Straw (~5 ozs)    Nectar Thick Nectar Thick Liquid: Not tested   Honey Thick Honey Thick Liquid: Not tested   Puree Puree: Within functional limits Presentation: Spoon (fed 1 trial)   Solid   GO   Solid: Within functional limits Presentation: Self Fed (4 trials)         Orinda Kenner, MS, CCC-SLP  Delainy Mcelhiney 09/01/2016,3:12 PM

## 2016-09-01 NOTE — ED Notes (Signed)
Patient taken grape jiuce

## 2016-09-01 NOTE — Evaluation (Signed)
Occupational Therapy Evaluation Patient Details Name: Ian [name] MRN: [id number] DOB: 04-Jan-1961 Today's Date: 09/01/2016    History of Present Illness Pt. is a 55 y.o. male who was admitted to [facility] with a new onset of weakness, and right handed weakness.    Clinical Impression   Pt. Is a 55 y.o. Male who was admitted to [facility] for further workup of new onset of RUE numbness, and weakness. Pt. Presents with weakness, decreased strength, decreased grip strength, impaired thumb opposition and impaired coordination skilles which hinder his ability to use his dominant RUE during ADL and IADL tasks. Pt. Could benefit from skilled OT serivces for ADL training, A/E training, there. Ex., and RUE neuromuscular re-ed in order to improve ADL and IADL functioning, and return to his PLOF.     Follow Up Recommendations  Outpatient OT    Equipment Recommendations       Recommendations for Other Services PT consult     Precautions / Restrictions Restrictions Weight Bearing Restrictions: No                    Transfers      Deferred. Pt. Preparing to go to MRI, testing, nursing assessment, and showering.                                                            ADL Overall ADL's : Needs assistance/impaired                                       General ADL Comments: Pt. has difficulty using his dominant Right hand for all ADL/IADL tasks. Pt. is able to perfrom these tasks using his left hand.      Vision     Perception     Praxis      Pertinent Vitals/Pain Pain Assessment: 0-10 Pain Score: 8  Pain Location: Left heel Pain Descriptors / Indicators: Aching     Hand Dominance Right   Extremity/Trunk Assessment Upper Extremity Assessment Upper Extremity Assessment: RUE deficits/detail RUE Deficits / Details: RUE strength 4/5 overall. Grip strength: R: 55#, L: 75#. Thumb opposition to the tip of the 3rd digit. RUE  Coordination: decreased fine motor;decreased gross motor           Communication Communication Communication: No difficulties   Cognition Arousal/Alertness: Awake/alert Behavior During Therapy: WFL for tasks assessed/performed Overall Cognitive Status: Within Functional Limits for tasks assessed                     General Comments       Exercises       Shoulder Instructions      Home Living Family/patient expects to be discharged to:: Private residence Living Arrangements: Alone   Type of Home: Mobile home Home Access: [place]: One level     Bathroom Shower/Tub: Occupational psychologist: [place] Accessibility: No   Home Equipment: Environmental consultant - 2 wheels;Cane - single point          Prior Functioning/Environment Level of Independence: Independent             OT Diagnosis: Generalized weakness   OT Problem  List: Decreased strength;Pain;Impaired UE functional use;Decreased coordination;Decreased activity tolerance;Decreased safety awareness   OT Treatment/Interventions: Self-care/ADL training;Therapeutic exercise;Therapeutic activities;Neuromuscular education;Patient/family education;DME and/or AE instruction    OT Goals(Current goals can be found in the care plan section) Acute Rehab OT Goals Patient Stated Goal: To return home OT Goal Formulation: With patient Time For Goal Achievement: 09/01/16 Potential to Achieve Goals: Good  OT Frequency: Min 1X/week   Barriers to D/C:            Co-evaluation              End of Session    Activity Tolerance: Patient tolerated treatment well Patient left: in bed;with call bell/phone within reach;with bed alarm set   Time: [id number] OT Time Calculation (min): 25 min Charges:  OT Evaluation $OT Eval Moderate Complexity: 1 Procedure G-Codes:    Harrel Carina, MS, OTR/L 09/01/2016, 10:14 AM

## 2016-09-01 NOTE — Evaluation (Signed)
Physical Therapy Evaluation Patient Details Name: Ian [name] MRN: [id number] DOB: 02-09-1961 Today's Date: 09/01/2016   History of Present Illness  Pt. is a 55 y.o. male who was admitted to [facility] with a new onset of R sided weakness. Pt reports approximately 50-100 falls in the last 12 month due to instability and lightheadedness  Clinical Impression  Pt admitted with above diagnosis. Pt currently with functional limitations due to the deficits listed below (see PT Problem List). Pt demonstrated decreased sensation in RUE and RLE. Decreased R grip strength and pain reported down back and into RLE with strength testing of RUE. Positive Hoffman bilateral UE. Positive pronator drift RUE and positive finger to nose RUE. No facial droop noted. Pt denies neck pain currently. Unable to test heel to shin as pt lacks adequate hip mobility to perform. Pt is primarily limited with mobility due to L heel pain. He refuses to shift weight to L heel during ambulation and is barely able to bear weight through L toes. Pt with tremulous RLE during ambulation due to increased WB. He also reports difficulty with placement due to reported sensory loss of RLE. Pt would benefit from OP PT for RLE strengthening and balance. Notified RN that pt would benefit from referral for orthopedics to assess L heel pain as it significantly limits mobility currently. Pt advised to utilize rolling walker at discharge. Pt will benefit from skilled PT services to address deficits in strength, balance, and mobility in order to return to full function at home.       Follow Up Recommendations Outpatient PT    Equipment Recommendations  None recommended by PT;Other (comment) (Pt encouraged to utilize RW at discharge)    Recommendations for Other Services       Precautions / Restrictions Precautions Precautions: Fall Restrictions Weight Bearing Restrictions: No      Mobility  Bed Mobility Overal bed mobility: Modified  Independent             General bed mobility comments: Use of bed rails. HOB flat. Pt reports he mostly sleeps on couch  Transfers Overall transfer level: Needs assistance Equipment used: Rolling walker (2 wheeled) Transfers: Sit to/from Stand Sit to Stand: Min guard         General transfer comment: Pt requires slight elevation of bed. Unwilling to put weight through L heel due to heel spur. Pt able to come to standing but requires increased UE support to keep weight off LLE  Ambulation/Gait Ambulation/Gait assistance: Min guard Ambulation Distance (Feet): 20 Feet Assistive device: Rolling walker (2 wheeled) Gait Pattern/deviations: Step-to pattern;Decreased stance time - left;Decreased weight shift to left Gait velocity: Decreased Gait velocity interpretation: <1.8 ft/sec, indicative of risk for recurrent falls General Gait Details: Pt ambulates to door and back to bed. Ambulates on L toes due to pain with WB through L heel secondary to heel spur. Pt reports tremulous RLE due to increased demand. States he has difficulty with WB through RLE due to N/T in R leg. Gait is slowed and primarily limited at this time due to L heel spur  Stairs            Wheelchair Mobility    Modified Rankin (Stroke Patients Only)       Balance Overall balance assessment: Needs assistance Sitting-balance support: No upper extremity supported Sitting balance-Leahy Scale: Good     Standing balance support: Bilateral upper extremity supported Standing balance-Leahy Scale: Poor Standing balance comment: Difficulty with standing balance due to L  heel pain from spur                             Pertinent Vitals/Pain Pain Assessment: 0-10 Pain Score: 8  Pain Location: Left heel Pain Descriptors / Indicators: [place] expects to be discharged to:: Private residence Living Arrangements: Alone Available Help at Discharge: Other (Comment)  (None) Type of Home: Mobile home Home Access: Ramped entrance     Home Layout: One level Home Equipment: [place] - 2 wheels;Cane - single point;Grab bars - toilet;Grab bars - tub/shower (no hospital bed, shower chair, no BSC)      Prior Function Level of Independence: Independent         Comments: Volunteers at a golf course     Hand Dominance   Dominant Hand: Right    Extremity/Trunk Assessment   Upper Extremity Assessment: Defer to OT evaluation RUE Deficits / Details: Notable decrease in R grip strength. Positive pronator drift on RUE. Finger to nose abnormal RUE         Lower Extremity Assessment: RLE deficits/detail RLE Deficits / Details: Varied N/T throughout RLE thigh, lateral leg, and foot. Unable to perform heel to shin due to hip AROM limitations       Communication   Communication: No difficulties  Cognition Arousal/Alertness: Awake/alert Behavior During Therapy: WFL for tasks assessed/performed Overall Cognitive Status: Within Functional Limits for tasks assessed                      General Comments      Exercises        Assessment/Plan    PT Assessment Patient needs continued PT services  PT Diagnosis Difficulty walking;Generalized weakness;Abnormality of gait;Acute pain   PT Problem List Decreased strength;Decreased activity tolerance;Decreased balance;Pain  PT Treatment Interventions DME instruction;Gait training;Functional mobility training;Therapeutic activities;Therapeutic exercise;Balance training;Neuromuscular re-education;Patient/family education   PT Goals (Current goals can be found in the Care Plan section) Acute Rehab PT Goals Patient Stated Goal: Return to prior level of function at home PT Goal Formulation: With patient Time For Goal Achievement: 09/15/16 Potential to Achieve Goals: Good    Frequency Min 2X/week   Barriers to discharge Decreased caregiver support Lives alone    Co-evaluation                End of Session Equipment Utilized During Treatment: Gait belt Activity Tolerance: Patient limited by pain Patient left: in bed;with call bell/phone within reach;with bed alarm set Nurse Communication: Mobility status;Other (comment) (Referral for ortho? secondary to heel spur)         Time: [id number] PT Time Calculation (min) (ACUTE ONLY): 22 min   Charges:   PT Evaluation $PT Eval Low Complexity: 1 Procedure PT Treatments $Gait Training: 8-22 mins   PT G Codes:       Lyndel Safe Leianna Barga PT, DPT   Vanita Cannell 09/01/2016, 3:37 PM

## 2016-09-01 NOTE — ED Triage Notes (Addendum)
Patient to ED via [facility] for left foot pain (especially heel for the past 4 days).   Patient reports feeling numb/tingling for the past several hours, but unable to state exactly how long.  Patient reports previous stroke in the past.  + ETOH tonight.  Patient very disheveled with strong body odor.

## 2016-09-01 NOTE — Consult Note (Addendum)
Referring Physician: Posey Pronto    Chief Complaint: Right sided numbness  HPI: Ian [name] is an 55 y.o. male with a history of stroke in the past who reports that on yesterday while at a golf game had the acute onset of feeling poorly then noted right sided numbness.  With no improvemetn in his symptoms presented to the ED for evaluation, mostly by his report due to left heel pain.  Although better today reports that he still has an abnormal sensation over the right.  Initial NIHSS of 2.    Date last known well: Date: 08/31/2016 Time last known well: Time: 14:00 tPA Given: No: Outside time window  Past Medical History:  Diagnosis Date  . Anxiety   . Barrett esophagus   . Cancer ([place])   . Coronary artery disease   . Depression   . GERD (gastroesophageal reflux disease)   . Hypertension     Past Surgical History:  Procedure Laterality Date  . ANGIOPLASTY    . APPENDECTOMY    . CARDIAC CATHETERIZATION    . CHOLECYSTECTOMY      Family History  Problem Relation Age of Onset  . Stroke Father   . Leukemia Father   . Breast cancer Sister    Social History:  reports that he has been smoking Cigarettes.  He has been smoking about 1.00 pack per day. He has never used smokeless tobacco. He reports that he drinks alcohol. He reports that he uses drugs, including Cocaine and Marijuana.  Allergies:  Allergies  Allergen Reactions  . Fluoxetine Swelling    Anxiety, itching  . Hydrocodone-Acetaminophen Itching    Medications:  I have reviewed the patient's current medications. Prior to Admission:  Prescriptions Prior to Admission  Medication Sig Dispense Refill Last Dose  . acetaminophen-codeine (TYLENOL #3) 300-30 MG tablet Take 1 tablet by mouth every 4 (four) hours as needed for pain.   Past Month at Unknown time  . citalopram (CELEXA) 20 MG tablet Take 20 mg by mouth at bedtime.  0 08/31/2016 at Unknown time  . clonazePAM (KLONOPIN) 0.5 MG tablet Take 0.5 mg by mouth 2 (two)  times daily as needed for anxiety.    08/31/2016 at Unknown time  . hydrOXYzine (ATARAX/VISTARIL) 50 MG tablet Take 1 tablet by mouth 2 (two) times daily as needed.  0 08/31/2016 at Unknown time  . lisinopril (PRINIVIL,ZESTRIL) 10 MG tablet Take 10 mg by mouth daily.  0 08/31/2016 at Unknown time  . pantoprazole (PROTONIX) 40 MG tablet Take 40 mg by mouth daily.   08/31/2016 at Unknown time  . PROAIR HFA 108 (90 BASE) MCG/ACT inhaler Inhale 1-2 puffs into the lungs every 6 (six) hours as needed for wheezing or shortness of breath.   0 prn at prn  . QUEtiapine (SEROQUEL) 300 MG tablet Take 1 tablet (300 mg total) by mouth at bedtime. (Patient taking differently: Take 400 mg by mouth at bedtime. ) 30 tablet 0 08/31/2016 at Unknown time  . QUEtiapine (SEROQUEL) 50 MG tablet Take 1 tablet (50 mg total) by mouth daily at 12 noon. 30 tablet 0 08/31/2016 at Unknown time  . aspirin EC 81 MG EC tablet Take 1 tablet (81 mg total) by mouth daily. (Patient not taking: Reported on 09/01/2016) 30 tablet 0 Not Taking at Unknown time  . gabapentin (NEURONTIN) 100 MG capsule Take 1 capsule (100 mg total) by mouth 3 (three) times daily. 90 capsule 2   . ondansetron (ZOFRAN) 4 MG tablet Take 1  tablet (4 mg total) by mouth daily as needed for nausea or vomiting. (Patient not taking: Reported on 09/01/2016) 10 tablet 0 Not Taking at Unknown time  . oxyCODONE-acetaminophen (ROXICET) 5-325 MG per tablet Take 1 tablet by mouth every 6 (six) hours as needed. (Patient not taking: Reported on 09/01/2016) 12 tablet 0 Not Taking at Unknown time  . thiamine 50 MG tablet Take 1 tablet (50 mg total) by mouth daily. (Patient not taking: Reported on 09/01/2016) 30 tablet 0 Not Taking at Unknown time   Scheduled: . aspirin EC  81 mg Oral Daily  . citalopram  20 mg Oral QHS  . enoxaparin (LOVENOX) injection  40 mg Subcutaneous Q24H  . lisinopril  10 mg Oral Daily  . nicotine  21 mg Transdermal Daily  . pantoprazole  40 mg Oral Daily  .  [START ON 09/02/2016] predniSONE  50 mg Oral Q breakfast   Followed by  . [START ON 09/03/2016] predniSONE  40 mg Oral Q breakfast   Followed by  . [START ON 09/04/2016] predniSONE  30 mg Oral Q breakfast   Followed by  . [START ON 09/05/2016] predniSONE  20 mg Oral Q breakfast   Followed by  . [START ON 09/06/2016] predniSONE  10 mg Oral Q breakfast   Followed by  . [START ON 09/07/2016] predniSONE  5 mg Oral Q breakfast  . QUEtiapine  300 mg Oral QHS  . QUEtiapine  50 mg Oral Q1200    ROS: History obtained from the patient  General ROS: negative for - chills, fatigue, fever, night sweats, weight gain or weight loss Psychological ROS: negative for - behavioral disorder, hallucinations, memory difficulties, mood swings or suicidal ideation Ophthalmic ROS: negative for - blurry vision, double vision, eye pain or loss of vision ENT ROS: negative for - epistaxis, nasal discharge, oral lesions, sore throat, tinnitus or vertigo Allergy and Immunology ROS: negative for - hives or itchy/watery eyes Hematological and Lymphatic ROS: negative for - bleeding problems, bruising or swollen lymph nodes Endocrine ROS: negative for - galactorrhea, hair pattern changes, polydipsia/polyuria or temperature intolerance Respiratory ROS: negative for - cough, hemoptysis, shortness of breath or wheezing Cardiovascular ROS: chest pain Gastrointestinal ROS: negative for - abdominal pain, diarrhea, hematemesis, nausea/vomiting or stool incontinence Genito-Urinary ROS: negative for - dysuria, hematuria, incontinence or urinary frequency/urgency Musculoskeletal ROS: left heel pain Neurological ROS: as noted in HPI Dermatological ROS: negative for rash and skin lesion changes  Physical Examination: Blood pressure (!) 144/96, pulse 88, temperature 97.5 F (36.4 C), temperature source Oral, resp. rate 18, height 6' (1.829 m), weight 111.3 kg (245 lb 4.8 oz), SpO2 96 %.  HEENT-  Normocephalic, no lesions, without  obvious abnormality.  Normal external eye and conjunctiva.  Normal TM's bilaterally.  Normal auditory canals and external ears. Normal external nose, mucus membranes and septum.  Normal pharynx. Cardiovascular- S1, S2 normal, pulses palpable throughout   Lungs- + wheezing Abdomen- soft, non-tender; bowel sounds normal; no masses,  no organomegaly Extremities- no edema Lymph-no adenopathy palpable Musculoskeletal-no joint tenderness, deformity or swelling Skin-multiple healing sores on legs bilaterally  Neurological Examination Mental Status: Alert, oriented, thought content appropriate.  Speech fluent without evidence of aphasia.  Able to follow 3 step commands without difficulty. Cranial Nerves: II: Discs flat bilaterally; Visual fields grossly normal, pupils equal, round, reactive to light and accommodation III,IV, VI: ptosis not present, extra-ocular motions intact bilaterally V,VII: smile symmetric, facial light touch sensation decreased on the right VIII: hearing normal bilaterally IX,X:  gag reflex present XI: bilateral shoulder shrug XII: midline tongue extension Motor: Right : Upper extremity   5/5    Left:     Upper extremity   5/5  Lower extremity   5/5     Lower extremity   5/5 Lower extremity cooperation limited by pain Sensory: Pinprick and light touch decreased in the right upper and lower extremity Deep Tendon Reflexes: 2+ and symmetric with absent AJ's bilaterally Plantars: Right: downgoing   Left: upgoing Cerebellar: Normal finger-to-nose testing bilaterally.  Heel-to-shin testing compromised by pain Gait: not tested due to safety concerns    Laboratory Studies:  Basic Metabolic Panel:  Recent Labs Lab 09/01/16 0103  NA 135  K 4.5  CL 104  CO2 21*  GLUCOSE 115*  BUN 7  CREATININE 0.66  CALCIUM 8.9    Liver Function Tests: No results for input(s): AST, ALT, ALKPHOS, BILITOT, PROT, ALBUMIN in the last 168 hours. No results for input(s): LIPASE, AMYLASE  in the last 168 hours. No results for input(s): AMMONIA in the last 168 hours.  CBC:  Recent Labs Lab 09/01/16 0103  WBC 11.4*  HGB 18.4*  HCT 53.0*  MCV 89.0  PLT 216    Cardiac Enzymes: No results for input(s): CKTOTAL, CKMB, CKMBINDEX, TROPONINI in the last 168 hours.  BNP: Invalid input(s): POCBNP  CBG: No results for input(s): GLUCAP in the last 168 hours.  Microbiology: No results found for this or any previous visit.  Coagulation Studies:  Recent Labs  09/01/16 0103  LABPROT 12.9  INR 0.97    Urinalysis:  Recent Labs Lab 09/01/16 0103  COLORURINE STRAW*  LABSPEC 1.002*  PHURINE 5.0  GLUCOSEU NEGATIVE  HGBUR NEGATIVE  BILIRUBINUR NEGATIVE  KETONESUR NEGATIVE  PROTEINUR NEGATIVE  NITRITE NEGATIVE  LEUKOCYTESUR NEGATIVE    Lipid Panel:    Component Value Date/Time   CHOL 155 11/16/2013 0520   CHOL 135 09/15/2012 0343   TRIG 129 11/16/2013 0520   TRIG 167 09/15/2012 0343   HDL 43 11/16/2013 0520   HDL 35 (L) 09/15/2012 0343   CHOLHDL 3.6 11/16/2013 0520   VLDL 26 11/16/2013 0520   VLDL 33 09/15/2012 0343   LDLCALC 86 11/16/2013 0520   LDLCALC 67 09/15/2012 0343    HgbA1C:  Lab Results  Component Value Date   HGBA1C 5.2 11/16/2013    Urine Drug Screen:     Component Value Date/Time   LABOPIA NONE DETECTED 05/03/2015 0042   LABOPIA NONE DETECTED 11/15/2013 2327   COCAINSCRNUR POSITIVE (A) 05/03/2015 0042   LABBENZ NONE DETECTED 05/03/2015 0042   LABBENZ NONE DETECTED 11/15/2013 2327   AMPHETMU NONE DETECTED 05/03/2015 0042   AMPHETMU NONE DETECTED 11/15/2013 2327   THCU NONE DETECTED 05/03/2015 0042   THCU NONE DETECTED 11/15/2013 2327   LABBARB NONE DETECTED 05/03/2015 0042   LABBARB NONE DETECTED 11/15/2013 2327    Alcohol Level: No results for input(s): ETH in the last 168 hours.  Other results: EKG: normal sinus rhythm at 93 bpm.  Imaging: Dg Chest 2 View  Result Date: 09/01/2016 CLINICAL DATA:  CVA EXAM: CHEST  2  VIEW COMPARISON:  08/13/2015 chest radiograph. FINDINGS: Stable cardiomediastinal silhouette with normal heart size. No pneumothorax. No pleural effusion. No pulmonary edema. Mild platelike atelectasis at the right lung base. IMPRESSION: Mild platelike atelectasis at the right lung base. Otherwise no active disease. Electronically Signed   By: Ilona Sorrel M.D.   On: 09/01/2016 12:09   Ct Head Wo Contrast  Result  Date: 09/01/2016 CLINICAL DATA:  Numbness and tingling for several hours, uncertain of symptom onset time. EXAM: CT HEAD WITHOUT CONTRAST TECHNIQUE: Contiguous axial images were obtained from the base of the skull through the vertex without intravenous contrast. COMPARISON:  Head CT 07/13/2015 FINDINGS: Brain: No evidence of acute infarction, hemorrhage, hydrocephalus, extra-axial collection or mass lesion/mass effect. Vascular: No hyperdense vessel or unexpected calcification. Atherosclerosis of skullbase vasculature. Skull: Normal. Negative for fracture or focal lesion. Sinuses/Orbits: Chronic paranasal sinus inflammation, stable from prior. No fluid level. Mastoid air cells are clear. IMPRESSION: No acute intracranial abnormality. Electronically Signed   By: Jeb Levering M.D.   On: 09/01/2016 03:05   Mr Brain Wo Contrast  Result Date: 09/01/2016 CLINICAL DATA:  CVA.  New onset numbness and weakness on the right. EXAM: MRI HEAD WITHOUT CONTRAST MRA HEAD WITHOUT CONTRAST TECHNIQUE: Multiplanar, multiecho pulse sequences of the brain and surrounding structures were obtained without intravenous contrast. Angiographic images of the head were obtained using MRA technique without contrast. COMPARISON:  Brain MRI 05/05/2015 FINDINGS: MRI HEAD FINDINGS Calvarium and upper cervical spine: No focal marrow signal abnormality. Orbits: Negative. Sinuses and Mastoids: Patchy mucosal thickening throughout the paranasal sinuses, moderate in the right maxillary antrum. Inflammation has progressed since 2016.  No fluid levels. Brain: No acute abnormality such as acute infarct, hemorrhage, hydrocephalus, or mass lesion. Few patchy FLAIR hyperintensities in the cerebral white matter, likely early chronic microvascular disease in this patient with vascular risk factors. MRA HEAD FINDINGS Symmetric carotid arteries and branching. Large posterior communicating arteries with fetal type PCA circulation. Intact anterior communicating artery. Right dominant vertebral artery with much of the left vertebral artery flow into PICA. The proximal V4 segments and PICAs are partially visualized. Small but smooth basilar in the setting of fetal type PCA circulation. No stenosis or major branch occlusion. Negative for aneurysm (bulbous appearance of the right inferior frontal branch is non aneurysmal based on multi projectional review in TeraRecon) or malformation. IMPRESSION: 1. No acute finding including infarct. 2. Negative intracranial MRA. 3. Chronic sinusitis, progressed from 2016. Electronically Signed   By: Monte Fantasia M.D.   On: 09/01/2016 12:55   US Carotid Bilateral (at [facility] And [facility] Only)  Result Date: 09/01/2016 CLINICAL DATA:  55 year old male with a history of cerebral vascular accident. Cardiovascular risk factors include hypertension, known prior stroke/ TIA, tobacco use, known vascular disease. EXAM: BILATERAL CAROTID DUPLEX ULTRASOUND TECHNIQUE: Pearline Cables scale imaging, color Doppler and duplex ultrasound were performed of bilateral carotid and vertebral arteries in the neck. COMPARISON:  05/03/2015 FINDINGS: Criteria: Quantification of carotid stenosis is based on velocity parameters that correlate the residual internal carotid diameter with NASCET-based stenosis levels, using the diameter of the distal internal carotid lumen as the denominator for stenosis measurement. The following velocity measurements were obtained: RIGHT ICA:  Systolic 79 cm/sec, Diastolic 34 cm/sec CCA:  95 cm/sec SYSTOLIC ICA/CCA RATIO:  0.8 ECA:   101 cm/sec LEFT ICA:  Systolic 76 cm/sec, Diastolic 35 cm/sec CCA:  0000000 cm/sec SYSTOLIC ICA/CCA RATIO:  0.7 ECA:  115 cm/sec Right Brachial SBP: Not acquired Left Brachial SBP: Not acquired RIGHT CAROTID ARTERY: No significant calcified disease of the right common carotid artery. Intermediate waveform maintained. Heterogeneous plaque without significant calcifications at the right carotid bifurcation. Low resistance waveform of the right ICA. No significant tortuosity. RIGHT VERTEBRAL ARTERY: Antegrade flow with low resistance waveform. LEFT CAROTID ARTERY: No significant calcified disease of the left common carotid artery. Intermediate waveform maintained. Heterogeneous plaque at the  left carotid bifurcation without significant calcifications. Low resistance waveform of the left ICA. LEFT VERTEBRAL ARTERY:  Antegrade flow with low resistance waveform. IMPRESSION: Color duplex indicates minimal heterogeneous plaque, with no hemodynamically significant stenosis by duplex criteria in the extracranial cerebrovascular circulation. Signed, Dulcy Fanny. Earleen Newport, DO [facility] Radiology Electronically Signed   By: Corrie Mckusick D.O.   On: 09/01/2016 12:05   Dg Foot Complete Left  Result Date: 09/01/2016 CLINICAL DATA:  Severe left heel pain, nontraumatic EXAM: LEFT FOOT - COMPLETE 3+ VIEW COMPARISON:  None. FINDINGS: Negative for acute fracture or dislocation. There is a plantar calcaneal spur. No significant arthritic changes are evident. There is no bone lesion or bony destruction. No radiopaque foreign body. IMPRESSION: Plantar calcaneal spur. Electronically Signed   By: Andreas Newport M.D.   On: 09/01/2016 05:39   Mr Jodene Nam Head/brain [id number] Cm  Result Date: 09/01/2016 CLINICAL DATA:  CVA.  New onset numbness and weakness on the right. EXAM: MRI HEAD WITHOUT CONTRAST MRA HEAD WITHOUT CONTRAST TECHNIQUE: Multiplanar, multiecho pulse sequences of the brain and surrounding  structures were obtained without intravenous contrast. Angiographic images of the head were obtained using MRA technique without contrast. COMPARISON:  Brain MRI 05/05/2015 FINDINGS: MRI HEAD FINDINGS Calvarium and upper cervical spine: No focal marrow signal abnormality. Orbits: Negative. Sinuses and Mastoids: Patchy mucosal thickening throughout the paranasal sinuses, moderate in the right maxillary antrum. Inflammation has progressed since 2016. No fluid levels. Brain: No acute abnormality such as acute infarct, hemorrhage, hydrocephalus, or mass lesion. Few patchy FLAIR hyperintensities in the cerebral white matter, likely early chronic microvascular disease in this patient with vascular risk factors. MRA HEAD FINDINGS Symmetric carotid arteries and branching. Large posterior communicating arteries with fetal type PCA circulation. Intact anterior communicating artery. Right dominant vertebral artery with much of the left vertebral artery flow into PICA. The proximal V4 segments and PICAs are partially visualized. Small but smooth basilar in the setting of fetal type PCA circulation. No stenosis or major branch occlusion. Negative for aneurysm (bulbous appearance of the right inferior frontal branch is non aneurysmal based on multi projectional review in TeraRecon) or malformation. IMPRESSION: 1. No acute finding including infarct. 2. Negative intracranial MRA. 3. Chronic sinusitis, progressed from 2016. Electronically Signed   By: Monte Fantasia M.D.   On: 09/01/2016 12:55    Assessment: 55 y.o. male presenting with complaints of right sided numbness.  Symptoms improving but patient not back to baseline.  MRI of the brain personally reviewed and shows no acute changes.  TIA remains in the differential.  Remaining stroke work up pending.  Patient on ASA at home.    Stroke Risk Factors - hypertension and smoking  Plan: 1. HgbA1c, fasting lipid panel 2. PT consult, OT consult, Speech consult 3.  Echocardiogram 4. Carotid dopplers 5. Prophylactic therapy-Antiplatelet med: Plavix - dose 75mg  daily 6. NPO until RN stroke swallow screen 7. Telemetry monitoring 8. Frequent neuro checks 9. Smoking cessation counseling   Alexis Goodell, MD Neurology [phone] 09/01/2016, 1:28 PM

## 2016-09-01 NOTE — ED Notes (Signed)
Xray bedside.

## 2016-09-01 NOTE — ED Notes (Addendum)
Patient states he feels like his "right side has been going to sleep recently"  He has a hx of left side affected stroke, and says he has had multiple TIA's since his stroke.  Patient is complaining of severe pain on his left heel and said he had gout in his right heel recently and I am unable to assess NIH with that leg/foot.  Patient says he has had 9 beers since 7pm last night.  MD informed.

## 2016-09-01 NOTE — ED Provider Notes (Signed)
[facility] Emergency Department Provider Note  ____________________________________________  Time seen: Approximately 5:13 AM  I have reviewed the triage vital signs and the nursing notes.   HISTORY  Chief Complaint Foot Pain and Numbness    HPI Ian [name] is a 55 y.o. male who complains of numbness and tingling on the right side in the face arm and right foot. He is unable to state exactly when this started but thinks that it was sometime in the afternoon on Sunday, September 10. Also complains of pain in the left heel. Denies any injuries. No fevers chills. Complaining of medications. Last saw his primary care doctor 4 months ago. History of a stroke on the left side.     Past Medical History:  Diagnosis Date  . Anxiety   . Barrett esophagus   . Cancer   . Coronary artery disease   . Depression   . GERD (gastroesophageal reflux disease)   . Hypertension      Patient Active Problem List   Diagnosis Date Noted  . Acute left-sided weakness 05/03/2015  . CVA (cerebral infarction) 05/03/2015  . Weakness 11/16/2013  . Chest pain 11/16/2013  . HTN (hypertension) 11/16/2013  . Left-sided weakness 11/16/2013  . Torsades de pointes ([place]) 11/16/2013  . Hemiplegia, unspecified, affecting nondominant side 11/15/2013  . Speech and language deficits 11/15/2013     Past Surgical History:  Procedure Laterality Date  . ANGIOPLASTY    . APPENDECTOMY    . CARDIAC CATHETERIZATION    . CHOLECYSTECTOMY       Prior to Admission medications   Medication Sig Start Date End Date Taking? Authorizing Provider  aspirin EC 81 MG EC tablet Take 1 tablet (81 mg total) by mouth daily. 05/07/15   Fritzi Mandes, MD  citalopram (CELEXA) 20 MG tablet Take 20 mg by mouth at bedtime. 04/12/15   Historical Provider, MD  clonazePAM (KLONOPIN) 0.5 MG tablet Take 0.5 mg by mouth at bedtime.  02/05/15   Historical Provider, MD  clonazePAM (KLONOPIN) 0.5 MG tablet Take 1 mg  by mouth 2 (two) times daily as needed for anxiety.    Historical Provider, MD  gabapentin (NEURONTIN) 100 MG capsule Take 1 capsule (100 mg total) by mouth 3 (three) times daily. 08/13/15 08/12/16  Nance Pear, MD  hydrOXYzine (ATARAX/VISTARIL) 50 MG tablet Take 1 tablet by mouth 2 (two) times daily as needed. 03/01/15   Historical Provider, MD  lisinopril (PRINIVIL,ZESTRIL) 10 MG tablet Take 10 mg by mouth daily. 03/01/15   Historical Provider, MD  omeprazole (PRILOSEC) 40 MG capsule Take 40 mg by mouth 2 (two) times daily.    Historical Provider, MD  ondansetron (ZOFRAN) 4 MG tablet Take 1 tablet (4 mg total) by mouth daily as needed for nausea or vomiting. 07/09/15   Ahmed Prima, MD  oxyCODONE-acetaminophen (PERCOCET/ROXICET) 5-325 MG per tablet Take 1 tablet by mouth every 4 (four) hours as needed for severe pain.    Historical Provider, MD  oxyCODONE-acetaminophen (ROXICET) 5-325 MG per tablet Take 1 tablet by mouth every 6 (six) hours as needed. 07/09/15   Ahmed Prima, MD  PROAIR HFA 108 [phone] BASE) MCG/ACT inhaler  03/01/15   Historical Provider, MD  QUEtiapine (SEROQUEL) 300 MG tablet Take 1 tablet (300 mg total) by mouth at bedtime. 11/17/13   Nita Sells, MD  QUEtiapine (SEROQUEL) 50 MG tablet Take 1 tablet (50 mg total) by mouth daily at 12 noon. 11/17/13   Nita Sells, MD  SEROQUEL  XR 400 MG 24 hr tablet Take 400 mg by mouth at bedtime. 02/05/15   Historical Provider, MD  sucralfate (CARAFATE) 1 GM/10ML suspension Take 1 g by mouth 4 (four) times daily.    Historical Provider, MD  thiamine 50 MG tablet Take 1 tablet (50 mg total) by mouth daily. 05/07/15   Fritzi Mandes, MD     Allergies Fluoxetine and Hydrocodone-acetaminophen   Family History  Problem Relation Age of Onset  . Stroke Father   . Leukemia Father   . Breast cancer Sister     Social History Social History  Substance Use Topics  . Smoking status: Current Every Day Smoker    Packs/day: 1.00     Types: Cigarettes  . Smokeless tobacco: Not on file  . Alcohol use Yes    Review of Systems  Constitutional:   No fever or chills.   Cardiovascular:   No chest pain. Respiratory:   No dyspnea or cough. Gastrointestinal:   Negative for abdominal pain, vomiting and diarrhea.  Musculoskeletal:   Left foot pain Neurological:   Negative for headaches. Right-sided paresthesia 10-point ROS otherwise negative.  ____________________________________________   PHYSICAL EXAM:  VITAL SIGNS: ED Triage Vitals  Enc Vitals Group     BP 09/01/16 0023 116/85     Pulse Rate 09/01/16 0023 72     Resp 09/01/16 0023 20     Temp 09/01/16 0023 98.1 F (36.7 C)     Temp Source 09/01/16 0023 Oral     SpO2 09/01/16 0023 97 %     Weight 09/01/16 0024 260 lb (117.9 kg)     Height 09/01/16 0024 6' (1.829 m)     Head Circumference --      Peak Flow --      Pain Score 09/01/16 0350 10     Pain Loc --      Pain Edu? --      Excl. in [place]? --     Vital signs reviewed, nursing assessments reviewed.   Constitutional:   Alert and oriented. Well appearing and in no distress. Eyes:   No scleral icterus. No conjunctival pallor. PERRL. EOMI.  No nystagmus. ENT   Head:   Normocephalic and atraumatic.   Nose:   No congestion/rhinnorhea. No septal hematoma   Mouth/Throat:   MMM, no pharyngeal erythema. No peritonsillar mass.    Neck:   No stridor. No SubQ emphysema. No meningismus. Hematological/Lymphatic/Immunilogical:   No cervical lymphadenopathy. Cardiovascular:   RRR. Symmetric bilateral radial and DP pulses.  No murmurs.  Respiratory:   Normal respiratory effort without tachypnea nor retractions. Breath sounds are clear and equal bilaterally. No wheezes/rales/rhonchi. Gastrointestinal:   Soft and nontender. Non distended. There is no CVA tenderness.  No rebound, rigidity, or guarding. Genitourinary:   deferred Musculoskeletal:   Nontender with normal range of motion in all extremities. No  joint effusions.  No lower extremity tenderness.  No edema. Neurologic:   Normal speech and language.  CN 2-10 normal. Positive right arm weakness compared to the left including grip strength biceps and triceps function. Positive drift on the right Positive limb ataxia with finger to nose on the right arm.. No significant lower extremity findings NIH stroke scale 4  Skin:    Skin is warm, dry and intact. No rash noted.  No petechiae, purpura, or bullae. No wounds. No induration streaking or inflammatory changes  ____________________________________________    LABS (pertinent positives/negatives) (all labs ordered are listed, but only abnormal results are displayed)  Labs Reviewed  CBC - Abnormal; Notable for the following:       Result Value   WBC 11.4 (*)    RBC 5.95 (*)    Hemoglobin 18.4 (*)    HCT 53.0 (*)    All other components within normal limits  BASIC METABOLIC PANEL - Abnormal; Notable for the following:    CO2 21 (*)    Glucose, Bld 115 (*)    All other components within normal limits  URINALYSIS COMPLETEWITH MICROSCOPIC ([facility] ONLY) - Abnormal; Notable for the following:    Color, Urine STRAW (*)    APPearance CLEAR (*)    Specific Gravity, Urine 1.002 (*)    All other components within normal limits   ____________________________________________   EKG  Interpreted by me  Date: 09/01/2016  Rate: 93  Rhythm: normal sinus rhythm  QRS Axis: normal  Intervals: normal  ST/T Wave abnormalities: normal  Conduction Disutrbances: none  Narrative Interpretation: unremarkable      ____________________________________________    RADIOLOGY  CT head unremarkable  ____________________________________________   PROCEDURES Procedures  ____________________________________________   INITIAL IMPRESSION / ASSESSMENT AND PLAN / ED COURSE  Pertinent labs & imaging results that were available during my care of the patient were reviewed by me and considered  in my medical decision making (see chart for details).  Patient presents with right-sided paresthesia and left heel pain. The heel pain seems unremarkable, but we'll get an x-ray. However, the paresthesia actually is associated with weakness lateralizing to the right arm and very concerning for a recurrent stroke. Nurse reports that the patient failed a swallow screen, so we will give a aspirin suppository and discussed with hospitalist for admission.     Clinical Course   ____________________________________________   FINAL CLINICAL IMPRESSION(S) / ED DIAGNOSES  Final diagnoses:  Foot pain, left  Cerebrovascular accident (CVA), unspecified mechanism ([place])       Portions of this note were generated with dragon dictation software. Dictation errors may occur despite best attempts at proofreading.    Carrie Mew, MD 09/01/16 [phone]

## 2016-09-01 NOTE — Progress Notes (Signed)
[place] at [facility]                                                                                                                                                                                            Patient Demographics   Ian [name], is a 55 y.o. male, DOB - 22-Oct-1961, [id number]  Admit date - 09/01/2016   Admitting Physician Harrie Foreman, MD  Outpatient Primary MD for the patient is [place]   LOS - 0  Subjective: Asian admitted with right-sided numbness he also reports that he had chest pain. Continues to have this tingling sensation on the right side of his upper extremity and lower extremity.    Review of Systems:   CONSTITUTIONAL: No documented fever. No fatigue, weakness. No weight gain, no weight loss.  EYES: No blurry or double vision.  ENT: No tinnitus. No postnasal drip. No redness of the oropharynx.  RESPIRATORY: No cough, no wheeze, no hemoptysis. No dyspnea.  CARDIOVASCULAR: No chest pain. No orthopnea. No palpitations. No syncope.  GASTROINTESTINAL: No nausea, no vomiting or diarrhea. No abdominal pain. No melena or hematochezia.  GENITOURINARY: No dysuria or hematuria.  ENDOCRINE: No polyuria or nocturia. No heat or cold intolerance.  HEMATOLOGY: No anemia. No bruising. No bleeding.  INTEGUMENTARY: No rashes. No lesions.  MUSCULOSKELETAL: No arthritis. No swelling. No gout.  NEUROLOGIC: Right-sided numbness and tingling PSYCHIATRIC: No anxiety. No insomnia. No ADD.    Vitals:   Vitals:   09/01/16 0746 09/01/16 0830 09/01/16 1018 09/01/16 1229  BP: 110/78 120/79 121/88 (!) 144/96  Pulse: 92 83 80 88  Resp: 16 18 18 18   Temp:  97.5 F (36.4 C) 97.6 F (36.4 C) 97.5 F (36.4 C)  TempSrc:  Oral Oral Oral  SpO2: 96% 96% 95% 96%  Weight:  245 lb 4.8 oz (111.3 kg)    Height:  6' (1.829 m)      Wt Readings from Last 3 Encounters:  09/01/16 245 lb 4.8 oz (111.3 kg)  08/13/15 265  lb (120.2 kg)  07/13/15 265 lb (120.2 kg)     Intake/Output Summary (Last 24 hours) at 09/01/16 1333 Last data filed at 09/01/16 1317  Gross per 24 hour  Intake                0 ml  Output              350 ml  Net             -350 ml    Physical Exam:   GENERAL: Pleasant-appearing in no apparent  distress.  HEAD, EYES, EARS, NOSE AND THROAT: Atraumatic, normocephalic. Extraocular muscles are intact. Pupils equal and reactive to light. Sclerae anicteric. No conjunctival injection. No oro-pharyngeal erythema.  NECK: Supple. There is no jugular venous distention. No bruits, no lymphadenopathy, no thyromegaly.  HEART: Regular rate and rhythm,. No murmurs, no rubs, no clicks.  LUNGS: Clear to auscultation bilaterally. No rales or rhonchi. No wheezes.  ABDOMEN: Soft, flat, nontender, nondistended. Has good bowel sounds. No hepatosplenomegaly appreciated.  EXTREMITIES: No evidence of any cyanosis, clubbing, or peripheral edema.  +2 pedal and radial pulses bilaterally.  NEUROLOGIC: The patient is alert, awake, and oriented x3 with no focal motor or sensory deficits appreciated bilaterally.  SKIN: Moist and warm with no rashes appreciated.  Psych: Not anxious, depressed LN: No inguinal LN enlargement    Antibiotics   Anti-infectives    None      Medications   Scheduled Meds: . aspirin EC  81 mg Oral Daily  . citalopram  20 mg Oral QHS  . enoxaparin (LOVENOX) injection  40 mg Subcutaneous Q24H  . lisinopril  10 mg Oral Daily  . nicotine  21 mg Transdermal Daily  . pantoprazole  40 mg Oral Daily  . [START ON 09/02/2016] predniSONE  50 mg Oral Q breakfast   Followed by  . [START ON 09/03/2016] predniSONE  40 mg Oral Q breakfast   Followed by  . [START ON 09/04/2016] predniSONE  30 mg Oral Q breakfast   Followed by  . [START ON 09/05/2016] predniSONE  20 mg Oral Q breakfast   Followed by  . [START ON 09/06/2016] predniSONE  10 mg Oral Q breakfast   Followed by  . [START ON  09/07/2016] predniSONE  5 mg Oral Q breakfast  . QUEtiapine  300 mg Oral QHS  . QUEtiapine  50 mg Oral Q1200   Continuous Infusions:  PRN Meds:.acetaminophen, albuterol, clonazePAM, hydrOXYzine, oxyCODONE, senna-docusate   Data Review:   Micro Results No results found for this or any previous visit (from the past 240 hour(s)).  Radiology Reports Dg Chest 2 View  Result Date: 09/01/2016 CLINICAL DATA:  CVA EXAM: CHEST  2 VIEW COMPARISON:  08/13/2015 chest radiograph. FINDINGS: Stable cardiomediastinal silhouette with normal heart size. No pneumothorax. No pleural effusion. No pulmonary edema. Mild platelike atelectasis at the right lung base. IMPRESSION: Mild platelike atelectasis at the right lung base. Otherwise no active disease. Electronically Signed   By: Ilona Sorrel M.D.   On: 09/01/2016 12:09   Ct Head Wo Contrast  Result Date: 09/01/2016 CLINICAL DATA:  Numbness and tingling for several hours, uncertain of symptom onset time. EXAM: CT HEAD WITHOUT CONTRAST TECHNIQUE: Contiguous axial images were obtained from the base of the skull through the vertex without intravenous contrast. COMPARISON:  Head CT 07/13/2015 FINDINGS: Brain: No evidence of acute infarction, hemorrhage, hydrocephalus, extra-axial collection or mass lesion/mass effect. Vascular: No hyperdense vessel or unexpected calcification. Atherosclerosis of skullbase vasculature. Skull: Normal. Negative for fracture or focal lesion. Sinuses/Orbits: Chronic paranasal sinus inflammation, stable from prior. No fluid level. Mastoid air cells are clear. IMPRESSION: No acute intracranial abnormality. Electronically Signed   By: Jeb Levering M.D.   On: 09/01/2016 03:05   Mr Brain Wo Contrast  Result Date: 09/01/2016 CLINICAL DATA:  CVA.  New onset numbness and weakness on the right. EXAM: MRI HEAD WITHOUT CONTRAST MRA HEAD WITHOUT CONTRAST TECHNIQUE: Multiplanar, multiecho pulse sequences of the brain and surrounding structures were  obtained without intravenous contrast. Angiographic images of the  head were obtained using MRA technique without contrast. COMPARISON:  Brain MRI 05/05/2015 FINDINGS: MRI HEAD FINDINGS Calvarium and upper cervical spine: No focal marrow signal abnormality. Orbits: Negative. Sinuses and Mastoids: Patchy mucosal thickening throughout the paranasal sinuses, moderate in the right maxillary antrum. Inflammation has progressed since 2016. No fluid levels. Brain: No acute abnormality such as acute infarct, hemorrhage, hydrocephalus, or mass lesion. Few patchy FLAIR hyperintensities in the cerebral white matter, likely early chronic microvascular disease in this patient with vascular risk factors. MRA HEAD FINDINGS Symmetric carotid arteries and branching. Large posterior communicating arteries with fetal type PCA circulation. Intact anterior communicating artery. Right dominant vertebral artery with much of the left vertebral artery flow into PICA. The proximal V4 segments and PICAs are partially visualized. Small but smooth basilar in the setting of fetal type PCA circulation. No stenosis or major branch occlusion. Negative for aneurysm (bulbous appearance of the right inferior frontal branch is non aneurysmal based on multi projectional review in TeraRecon) or malformation. IMPRESSION: 1. No acute finding including infarct. 2. Negative intracranial MRA. 3. Chronic sinusitis, progressed from 2016. Electronically Signed   By: Monte Fantasia M.D.   On: 09/01/2016 12:55   US Carotid Bilateral (at [facility] Only)  Result Date: 09/01/2016 CLINICAL DATA:  55 year old male with a history of cerebral vascular accident. Cardiovascular risk factors include hypertension, known prior stroke/ TIA, tobacco use, known vascular disease. EXAM: BILATERAL CAROTID DUPLEX ULTRASOUND TECHNIQUE: Pearline Cables scale imaging, color Doppler and duplex ultrasound were performed of bilateral carotid and vertebral arteries in the neck. COMPARISON:   05/03/2015 FINDINGS: Criteria: Quantification of carotid stenosis is based on velocity parameters that correlate the residual internal carotid diameter with NASCET-based stenosis levels, using the diameter of the distal internal carotid lumen as the denominator for stenosis measurement. The following velocity measurements were obtained: RIGHT ICA:  Systolic 79 cm/sec, Diastolic 34 cm/sec CCA:  95 cm/sec SYSTOLIC ICA/CCA RATIO:  0.8 ECA:  101 cm/sec LEFT ICA:  Systolic 76 cm/sec, Diastolic 35 cm/sec CCA:  0000000 cm/sec SYSTOLIC ICA/CCA RATIO:  0.7 ECA:  115 cm/sec Right Brachial SBP: Not acquired Left Brachial SBP: Not acquired RIGHT CAROTID ARTERY: No significant calcified disease of the right common carotid artery. Intermediate waveform maintained. Heterogeneous plaque without significant calcifications at the right carotid bifurcation. Low resistance waveform of the right ICA. No significant tortuosity. RIGHT VERTEBRAL ARTERY: Antegrade flow with low resistance waveform. LEFT CAROTID ARTERY: No significant calcified disease of the left common carotid artery. Intermediate waveform maintained. Heterogeneous plaque at the left carotid bifurcation without significant calcifications. Low resistance waveform of the left ICA. LEFT VERTEBRAL ARTERY:  Antegrade flow with low resistance waveform. IMPRESSION: Color duplex indicates minimal heterogeneous plaque, with no hemodynamically significant stenosis by duplex criteria in the extracranial cerebrovascular circulation. Signed, Dulcy Fanny. Earleen Newport, DO [facility] Radiology Electronically Signed   By: Corrie Mckusick D.O.   On: 09/01/2016 12:05   Dg Foot Complete Left  Result Date: 09/01/2016 CLINICAL DATA:  Severe left heel pain, nontraumatic EXAM: LEFT FOOT - COMPLETE 3+ VIEW COMPARISON:  None. FINDINGS: Negative for acute fracture or dislocation. There is a plantar calcaneal spur. No significant arthritic changes are evident.  There is no bone lesion or bony destruction. No radiopaque foreign body. IMPRESSION: Plantar calcaneal spur. Electronically Signed   By: Andreas Newport M.D.   On: 09/01/2016 05:39   Mr Jodene Nam Head/brain [id number] Cm  Result Date: 09/01/2016 CLINICAL DATA:  CVA.  New onset numbness  and weakness on the right. EXAM: MRI HEAD WITHOUT CONTRAST MRA HEAD WITHOUT CONTRAST TECHNIQUE: Multiplanar, multiecho pulse sequences of the brain and surrounding structures were obtained without intravenous contrast. Angiographic images of the head were obtained using MRA technique without contrast. COMPARISON:  Brain MRI 05/05/2015 FINDINGS: MRI HEAD FINDINGS Calvarium and upper cervical spine: No focal marrow signal abnormality. Orbits: Negative. Sinuses and Mastoids: Patchy mucosal thickening throughout the paranasal sinuses, moderate in the right maxillary antrum. Inflammation has progressed since 2016. No fluid levels. Brain: No acute abnormality such as acute infarct, hemorrhage, hydrocephalus, or mass lesion. Few patchy FLAIR hyperintensities in the cerebral white matter, likely early chronic microvascular disease in this patient with vascular risk factors. MRA HEAD FINDINGS Symmetric carotid arteries and branching. Large posterior communicating arteries with fetal type PCA circulation. Intact anterior communicating artery. Right dominant vertebral artery with much of the left vertebral artery flow into PICA. The proximal V4 segments and PICAs are partially visualized. Small but smooth basilar in the setting of fetal type PCA circulation. No stenosis or major branch occlusion. Negative for aneurysm (bulbous appearance of the right inferior frontal branch is non aneurysmal based on multi projectional review in TeraRecon) or malformation. IMPRESSION: 1. No acute finding including infarct. 2. Negative intracranial MRA. 3. Chronic sinusitis, progressed from 2016. Electronically Signed   By: Monte Fantasia M.D.   On: 09/01/2016 12:55      CBC  Recent Labs Lab 09/01/16 0103  WBC 11.4*  HGB 18.4*  HCT 53.0*  PLT 216  MCV 89.0  MCH 30.9  MCHC 34.7  RDW 13.4    Chemistries   Recent Labs Lab 09/01/16 0103  NA 135  K 4.5  CL 104  CO2 21*  GLUCOSE 115*  BUN 7  CREATININE 0.66  CALCIUM 8.9   ------------------------------------------------------------------------------------------------------------------ estimated creatinine clearance is 132.9 mL/min (by C-G formula based on SCr of 0.8 mg/dL). ------------------------------------------------------------------------------------------------------------------ No results for input(s): HGBA1C in the last 72 hours. ------------------------------------------------------------------------------------------------------------------ No results for input(s): CHOL, HDL, LDLCALC, TRIG, CHOLHDL, LDLDIRECT in the last 72 hours. ------------------------------------------------------------------------------------------------------------------ No results for input(s): TSH, T4TOTAL, T3FREE, THYROIDAB in the last 72 hours.  Invalid input(s): FREET3 ------------------------------------------------------------------------------------------------------------------ No results for input(s): VITAMINB12, FOLATE, FERRITIN, TIBC, IRON, RETICCTPCT in the last 72 hours.  Coagulation profile  Recent Labs Lab 09/01/16 0103  INR 0.97    No results for input(s): DDIMER in the last 72 hours.  Cardiac Enzymes No results for input(s): CKMB, TROPONINI, MYOGLOBIN in the last 168 hours.  Invalid input(s): CK ------------------------------------------------------------------------------------------------------------------ Invalid input(s): [place]  Patient's 55 year old admitted with right-sided tingling and numbness 1. Right sided numbness MRI and MRAs negative. Unclear etiology L check B12 level neurology to see the patient 2. Hypertension: Controlled;  continue lisinopril 3. Coronary artery disease: Patient reported that he was also having chest pain I will check a stress test for tomorrow morning he has multiple risk factors check cardiac enzymes 4. Acute COPD exasperation continue therapy with nebulizers and steroids 5. Tobacco abuse: Ongoing. NicoDerm patch while hospitalized 6. DVT prophylaxis: Lovenox 7. GI prophylaxis: None The patient is a full code. Time spent on admission orders and patient care approximately 45 minutes      Code Status Orders        Start     Ordered   09/01/16 0922  Full code  Continuous     09/01/16 0922    Code Status History    Date Active Date Inactive Code Status Order  ID Comments User Context   09/01/2016  9:22 AM 09/01/2016  1:13 PM Full Code [id number]  Harrie Foreman, MD Inpatient   05/03/2015  4:25 AM 05/07/2015  6:45 PM Full Code [id number]  Juluis Mire, MD ED   11/16/2013  2:35 AM 11/17/2013 12:49 PM Full Code [id number]  Rise Patience, MD Inpatient    Advance Directive Documentation   Flowsheet Row Most Recent Value  Type of Advance Directive  Healthcare Power of Attorney, Living will  Pre-existing out of facility DNR order (yellow form or pink MOST form)  No data  "MOST" Form in Place?  No data           Consults  Neuro DVT Prophylaxis  Lovenox   Lab Results  Component Value Date   PLT 216 09/01/2016     Time Spent in minutes  45 minutes  Greater than 50% of time spent in care coordination and counseling patient regarding the condition and plan of care.   Dustin Flock M.D on 09/01/2016 at 1:33 PM  Between 7am to 6pm - Pager - [phone]  After 6pm go to [email or web address] - password [email or web address] [facility]   Office  [phone]

## 2016-09-01 NOTE — Progress Notes (Signed)
*  PRELIMINARY RESULTS* Echocardiogram 2D Echocardiogram has been performed.  Sherrie Sport 09/01/2016, 3:31 PM

## 2016-09-02 ENCOUNTER — Inpatient Hospital Stay: Payer: Medicaid Other

## 2016-09-02 ENCOUNTER — Encounter: Payer: Self-pay | Admitting: Radiology

## 2016-09-02 ENCOUNTER — Inpatient Hospital Stay (HOSPITAL_BASED_OUTPATIENT_CLINIC_OR_DEPARTMENT_OTHER): Payer: Medicaid Other

## 2016-09-02 DIAGNOSIS — R202 Paresthesia of skin: Secondary | ICD-10-CM

## 2016-09-02 DIAGNOSIS — R079 Chest pain, unspecified: Secondary | ICD-10-CM

## 2016-09-02 LAB — HEMOGLOBIN A1C: Hgb A1c MFr Bld: 5.3 % (ref 4.0–6.0)

## 2016-09-02 LAB — LIPID PANEL
Cholesterol: 144 mg/dL (ref 0–200)
HDL: 35 mg/dL — ABNORMAL LOW (ref >40)
LDL Cholesterol: 75 mg/dL (ref 0–99)
Total CHOL/HDL Ratio: 4.1 RATIO
Triglycerides: 171 mg/dL — ABNORMAL HIGH (ref <150)
VLDL: 34 mg/dL (ref 0–40)

## 2016-09-02 LAB — ECHOCARDIOGRAM COMPLETE
Height: 72 in
Weight: 3924.8 oz

## 2016-09-02 LAB — NM MYOCAR MULTI W/SPECT W/WALL MOTION / EF
CSEPED: 0 min
CSEPEDS: 0 s
CSEPHR: 60 %
Estimated workload: 1 METS
LVDIAVOL: 99 mL (ref 62–150)
LVSYSVOL: 34 mL
MPHR: 162 {beats}/min
Peak HR: 98 {beats}/min
Rest HR: 76 {beats}/min
SDS: 0
SRS: 0
SSS: 0
TID: 1.2

## 2016-09-02 LAB — CALCITRIOL (1,25 DI-OH VIT D): Vit D, 1,25-Dihydroxy: 53.7 pg/mL (ref 19.9–79.3)

## 2016-09-02 LAB — TROPONIN I: Troponin I: <0.03 ng/mL (ref <0.03)

## 2016-09-02 MED ORDER — ATORVASTATIN CALCIUM 20 MG PO TABS
40.0000 mg | ORAL_TABLET | Freq: Every day | ORAL | Status: DC
  Administered 2016-09-02: 17:00:00 40 mg via ORAL
  Filled 2016-09-02: qty 2

## 2016-09-02 MED ORDER — TECHNETIUM TC 99M TETROFOSMIN IV KIT
32.5900 | PACK | Freq: Once | INTRAVENOUS | Status: AC | PRN
  Administered 2016-09-02: 32.59 via INTRAVENOUS

## 2016-09-02 MED ORDER — GABAPENTIN 300 MG PO CAPS
300.0000 mg | ORAL_CAPSULE | Freq: Three times a day (TID) | ORAL | Status: DC
  Filled 2016-09-02: qty 1

## 2016-09-02 MED ORDER — TECHNETIUM TC 99M TETROFOSMIN IV KIT
12.5800 | PACK | Freq: Once | INTRAVENOUS | Status: AC | PRN
  Administered 2016-09-02: 11:00:00 12.58 via INTRAVENOUS

## 2016-09-02 MED ORDER — REGADENOSON 0.4 MG/5ML IV SOLN
0.4000 mg | Freq: Once | INTRAVENOUS | Status: AC
  Administered 2016-09-02: 12:00:00 0.4 mg via INTRAVENOUS

## 2016-09-02 NOTE — Progress Notes (Signed)
[place] at [facility]                                                                                                                                                                                            Patient Demographics   Ian [name], is a 55 y.o. male, DOB - Feb 27, 1961, [id number]  Admit date - 09/01/2016   Admitting Physician Harrie Foreman, MD  Outpatient Primary MD for the patient is [place]   LOS - 1  Subjective:  he reports Numbness in his face, persistent weakness in the upper and the lower extremity denies any chest pain or shortness of breath   Review of Systems:   CONSTITUTIONAL: No documented fever. No fatigue, weakness. No weight gain, no weight loss.  EYES: No blurry or double vision.  ENT: No tinnitus. No postnasal drip. No redness of the oropharynx.  RESPIRATORY: No cough, no wheeze, no hemoptysis. No dyspnea.  CARDIOVASCULAR: No chest pain. No orthopnea. No palpitations. No syncope.  GASTROINTESTINAL: No nausea, no vomiting or diarrhea. No abdominal pain. No melena or hematochezia.  GENITOURINARY: No dysuria or hematuria.  ENDOCRINE: No polyuria or nocturia. No heat or cold intolerance.  HEMATOLOGY: No anemia. No bruising. No bleeding.  INTEGUMENTARY: No rashes. No lesions.  MUSCULOSKELETAL: No arthritis. No swelling. No gout.  NEUROLOGIC: Right-sided numbness and tingling PSYCHIATRIC: No anxiety. No insomnia. No ADD.    Vitals:   Vitals:   09/01/16 1229 09/01/16 2012 09/02/16 0433 09/02/16 1338  BP: (!) 144/96 133/86 126/79 (!) 138/92  Pulse: 88 83 84 86  Resp: 18 16    Temp: 97.5 F (36.4 C) 98 F (36.7 C) 98 F (36.7 C) 98.6 F (37 C)  TempSrc: Oral Oral Oral Oral  SpO2: 96% 92% 91% 93%  Weight:      Height:        Wt Readings from Last 3 Encounters:  09/01/16 245 lb 4.8 oz (111.3 kg)  08/13/15 265 lb (120.2 kg)  07/13/15 265 lb (120.2 kg)     Intake/Output  Summary (Last 24 hours) at 09/02/16 1343 Last data filed at 09/02/16 1213  Gross per 24 hour  Intake              840 ml  Output             1600 ml  Net             -760 ml    Physical Exam:   GENERAL: Pleasant-appearing in no apparent distress.  HEAD, EYES, EARS, NOSE AND THROAT: Atraumatic, normocephalic. Extraocular muscles are  intact. Pupils equal and reactive to light. Sclerae anicteric. No conjunctival injection. No oro-pharyngeal erythema.  NECK: Supple. There is no jugular venous distention. No bruits, no lymphadenopathy, no thyromegaly.  HEART: Regular rate and rhythm,. No murmurs, no rubs, no clicks.  LUNGS: Clear to auscultation bilaterally. No rales or rhonchi. No wheezes.  ABDOMEN: Soft, flat, nontender, nondistended. Has good bowel sounds. No hepatosplenomegaly appreciated.  EXTREMITIES: No evidence of any cyanosis, clubbing, or peripheral edema.  +2 pedal and radial pulses bilaterally.  NEUROLOGIC: The patient is alert, awake, and oriented x3 with no focal motor or sensory deficits appreciated bilaterally.  SKIN: Moist and warm with no rashes appreciated.  Psych: Not anxious, depressed LN: No inguinal LN enlargement    Antibiotics   Anti-infectives    None      Medications   Scheduled Meds: . aspirin EC  81 mg Oral Daily  . citalopram  20 mg Oral QHS  . enoxaparin (LOVENOX) injection  40 mg Subcutaneous Q24H  . lisinopril  10 mg Oral Daily  . nicotine  21 mg Transdermal Daily  . pantoprazole  40 mg Oral Daily  . [START ON 09/03/2016] predniSONE  40 mg Oral Q breakfast   Followed by  . [START ON 09/04/2016] predniSONE  30 mg Oral Q breakfast   Followed by  . [START ON 09/05/2016] predniSONE  20 mg Oral Q breakfast   Followed by  . [START ON 09/06/2016] predniSONE  10 mg Oral Q breakfast   Followed by  . [START ON 09/07/2016] predniSONE  5 mg Oral Q breakfast  . QUEtiapine  400 mg Oral QHS  . QUEtiapine  50 mg Oral Q1200   Continuous Infusions:  PRN  Meds:.acetaminophen, albuterol, clonazePAM, hydrOXYzine, oxyCODONE, senna-docusate   Data Review:   Micro Results No results found for this or any previous visit (from the past 240 hour(s)).  Radiology Reports Dg Chest 2 View  Result Date: 09/01/2016 CLINICAL DATA:  CVA EXAM: CHEST  2 VIEW COMPARISON:  08/13/2015 chest radiograph. FINDINGS: Stable cardiomediastinal silhouette with normal heart size. No pneumothorax. No pleural effusion. No pulmonary edema. Mild platelike atelectasis at the right lung base. IMPRESSION: Mild platelike atelectasis at the right lung base. Otherwise no active disease. Electronically Signed   By: Ilona Sorrel M.D.   On: 09/01/2016 12:09   Ct Head Wo Contrast  Result Date: 09/01/2016 CLINICAL DATA:  Numbness and tingling for several hours, uncertain of symptom onset time. EXAM: CT HEAD WITHOUT CONTRAST TECHNIQUE: Contiguous axial images were obtained from the base of the skull through the vertex without intravenous contrast. COMPARISON:  Head CT 07/13/2015 FINDINGS: Brain: No evidence of acute infarction, hemorrhage, hydrocephalus, extra-axial collection or mass lesion/mass effect. Vascular: No hyperdense vessel or unexpected calcification. Atherosclerosis of skullbase vasculature. Skull: Normal. Negative for fracture or focal lesion. Sinuses/Orbits: Chronic paranasal sinus inflammation, stable from prior. No fluid level. Mastoid air cells are clear. IMPRESSION: No acute intracranial abnormality. Electronically Signed   By: Jeb Levering M.D.   On: 09/01/2016 03:05   Mr Brain Wo Contrast  Result Date: 09/01/2016 CLINICAL DATA:  CVA.  New onset numbness and weakness on the right. EXAM: MRI HEAD WITHOUT CONTRAST MRA HEAD WITHOUT CONTRAST TECHNIQUE: Multiplanar, multiecho pulse sequences of the brain and surrounding structures were obtained without intravenous contrast. Angiographic images of the head were obtained using MRA technique without contrast. COMPARISON:  Brain  MRI 05/05/2015 FINDINGS: MRI HEAD FINDINGS Calvarium and upper cervical spine: No focal marrow signal abnormality. Orbits: Negative.  Sinuses and Mastoids: Patchy mucosal thickening throughout the paranasal sinuses, moderate in the right maxillary antrum. Inflammation has progressed since 2016. No fluid levels. Brain: No acute abnormality such as acute infarct, hemorrhage, hydrocephalus, or mass lesion. Few patchy FLAIR hyperintensities in the cerebral white matter, likely early chronic microvascular disease in this patient with vascular risk factors. MRA HEAD FINDINGS Symmetric carotid arteries and branching. Large posterior communicating arteries with fetal type PCA circulation. Intact anterior communicating artery. Right dominant vertebral artery with much of the left vertebral artery flow into PICA. The proximal V4 segments and PICAs are partially visualized. Small but smooth basilar in the setting of fetal type PCA circulation. No stenosis or major branch occlusion. Negative for aneurysm (bulbous appearance of the right inferior frontal branch is non aneurysmal based on multi projectional review in TeraRecon) or malformation. IMPRESSION: 1. No acute finding including infarct. 2. Negative intracranial MRA. 3. Chronic sinusitis, progressed from 2016. Electronically Signed   By: Monte Fantasia M.D.   On: 09/01/2016 12:55   Mr Cervical Spine Wo Contrast  Result Date: 09/02/2016 CLINICAL DATA:  55 year old male with new onset right side numbness and weakness. Initial encounter. EXAM: MRI CERVICAL SPINE WITHOUT CONTRAST TECHNIQUE: Multiplanar, multisequence MR imaging of the cervical spine was performed. No intravenous contrast was administered. COMPARISON:  Brain MRI and intracranial MRA a 09/01/2016. Cervical spine MRI 05/04/2015. FINDINGS: Alignment: Stable since 2016, relatively preserved cervical lordosis. Vertebrae: No marrow edema or evidence of acute osseous abnormality. Incidental C5 superior endplate  benign vertebral hemangioma re- demonstrated. Cord: Spinal cord signal is within normal limits at all visualized levels. Posterior Fossa, vertebral arteries, paraspinal tissues: Cervicomedullary junction is within normal limits. Sub cm T2 hyperintense bilateral thyroid nodules do not meet consensus criteria for ultrasound follow-up. Otherwise negative neck soft tissues. Preserved major vascular flow voids. Mildly dominant right vertebral artery. Disc levels: C2-C3:  Mild left facet and uncovertebral hypertrophy. C3-C4:  Mild facet hypertrophy. C4-C5:  Mild facet hypertrophy. C5-C6: Mild circumferential disc bulge. Broad-based posterior component. Mild uncovertebral and facet hypertrophy. No spinal or significant neural foraminal stenosis. C6-C7: Minimal disc bulge. Small central annular fissure of the disc (series 6, image 24). No stenosis. C7-T1:  Negative. No upper thoracic spinal stenosis is evident. IMPRESSION: Mild for age cervical spine degeneration is stable since 2016 with no associated spinal stenosis or convincing neural impingement. Electronically Signed   By: Genevie Ann M.D.   On: 09/02/2016 13:23   US Carotid Bilateral (at [facility] Only)  Result Date: 09/01/2016 CLINICAL DATA:  55 year old male with a history of cerebral vascular accident. Cardiovascular risk factors include hypertension, known prior stroke/ TIA, tobacco use, known vascular disease. EXAM: BILATERAL CAROTID DUPLEX ULTRASOUND TECHNIQUE: Pearline Cables scale imaging, color Doppler and duplex ultrasound were performed of bilateral carotid and vertebral arteries in the neck. COMPARISON:  05/03/2015 FINDINGS: Criteria: Quantification of carotid stenosis is based on velocity parameters that correlate the residual internal carotid diameter with NASCET-based stenosis levels, using the diameter of the distal internal carotid lumen as the denominator for stenosis measurement. The following velocity measurements were obtained: RIGHT ICA:  Systolic 79  cm/sec, Diastolic 34 cm/sec CCA:  95 cm/sec SYSTOLIC ICA/CCA RATIO:  0.8 ECA:  101 cm/sec LEFT ICA:  Systolic 76 cm/sec, Diastolic 35 cm/sec CCA:  0000000 cm/sec SYSTOLIC ICA/CCA RATIO:  0.7 ECA:  115 cm/sec Right Brachial SBP: Not acquired Left Brachial SBP: Not acquired RIGHT CAROTID ARTERY: No significant calcified disease of the right common carotid artery. Intermediate waveform  maintained. Heterogeneous plaque without significant calcifications at the right carotid bifurcation. Low resistance waveform of the right ICA. No significant tortuosity. RIGHT VERTEBRAL ARTERY: Antegrade flow with low resistance waveform. LEFT CAROTID ARTERY: No significant calcified disease of the left common carotid artery. Intermediate waveform maintained. Heterogeneous plaque at the left carotid bifurcation without significant calcifications. Low resistance waveform of the left ICA. LEFT VERTEBRAL ARTERY:  Antegrade flow with low resistance waveform. IMPRESSION: Color duplex indicates minimal heterogeneous plaque, with no hemodynamically significant stenosis by duplex criteria in the extracranial cerebrovascular circulation. Signed, Dulcy Fanny. Earleen Newport, DO [facility] Radiology Electronically Signed   By: Corrie Mckusick D.O.   On: 09/01/2016 12:05   Dg Foot Complete Left  Result Date: 09/01/2016 CLINICAL DATA:  Severe left heel pain, nontraumatic EXAM: LEFT FOOT - COMPLETE 3+ VIEW COMPARISON:  None. FINDINGS: Negative for acute fracture or dislocation. There is a plantar calcaneal spur. No significant arthritic changes are evident. There is no bone lesion or bony destruction. No radiopaque foreign body. IMPRESSION: Plantar calcaneal spur. Electronically Signed   By: Andreas Newport M.D.   On: 09/01/2016 05:39   Mr Jodene Nam Head/brain [id number] Cm  Result Date: 09/01/2016 CLINICAL DATA:  CVA.  New onset numbness and weakness on the right. EXAM: MRI HEAD WITHOUT CONTRAST MRA HEAD WITHOUT CONTRAST  TECHNIQUE: Multiplanar, multiecho pulse sequences of the brain and surrounding structures were obtained without intravenous contrast. Angiographic images of the head were obtained using MRA technique without contrast. COMPARISON:  Brain MRI 05/05/2015 FINDINGS: MRI HEAD FINDINGS Calvarium and upper cervical spine: No focal marrow signal abnormality. Orbits: Negative. Sinuses and Mastoids: Patchy mucosal thickening throughout the paranasal sinuses, moderate in the right maxillary antrum. Inflammation has progressed since 2016. No fluid levels. Brain: No acute abnormality such as acute infarct, hemorrhage, hydrocephalus, or mass lesion. Few patchy FLAIR hyperintensities in the cerebral white matter, likely early chronic microvascular disease in this patient with vascular risk factors. MRA HEAD FINDINGS Symmetric carotid arteries and branching. Large posterior communicating arteries with fetal type PCA circulation. Intact anterior communicating artery. Right dominant vertebral artery with much of the left vertebral artery flow into PICA. The proximal V4 segments and PICAs are partially visualized. Small but smooth basilar in the setting of fetal type PCA circulation. No stenosis or major branch occlusion. Negative for aneurysm (bulbous appearance of the right inferior frontal branch is non aneurysmal based on multi projectional review in TeraRecon) or malformation. IMPRESSION: 1. No acute finding including infarct. 2. Negative intracranial MRA. 3. Chronic sinusitis, progressed from 2016. Electronically Signed   By: Monte Fantasia M.D.   On: 09/01/2016 12:55     CBC  Recent Labs Lab 09/01/16 0103  WBC 11.4*  HGB 18.4*  HCT 53.0*  PLT 216  MCV 89.0  MCH 30.9  MCHC 34.7  RDW 13.4    Chemistries   Recent Labs Lab 09/01/16 0103  NA 135  K 4.5  CL 104  CO2 21*  GLUCOSE 115*  BUN 7  CREATININE 0.66  CALCIUM 8.9    ------------------------------------------------------------------------------------------------------------------ estimated creatinine clearance is 132.9 mL/min (by C-G formula based on SCr of 0.8 mg/dL). ------------------------------------------------------------------------------------------------------------------ No results for input(s): HGBA1C in the last 72 hours. ------------------------------------------------------------------------------------------------------------------  Recent Labs  09/02/16 0554  CHOL 144  HDL 35*  LDLCALC 75  TRIG 171*  CHOLHDL 4.1   ------------------------------------------------------------------------------------------------------------------ No results for input(s): TSH, T4TOTAL, T3FREE, THYROIDAB in the last 72 hours.  Invalid input(s): FREET3 ------------------------------------------------------------------------------------------------------------------  Recent Labs  09/01/16 1401  VITAMINB12 310    Coagulation profile  Recent Labs Lab 09/01/16 0103  INR 0.97    No results for input(s): DDIMER in the last 72 hours.  Cardiac Enzymes  Recent Labs Lab 09/01/16 1401 09/01/16 2116 09/02/16 0554  TROPONINI <0.03 <0.03 <0.03   ------------------------------------------------------------------------------------------------------------------ Invalid input(s): POCBNP    Assessment & Plan  Patient's 55 year old admitted with right-sided tingling and numbness 1. Right sided numbness MRI and MRAs negative. Unclear etiology Patient's symptoms are inconsistent will obtain MRI of his C-spine 2. Hypertension: Controlled; continue lisinopril 3. Coronary artery disease: Stress test results pending 4. Acute COPD exasperation continue therapy with nebulizers and steroids 5. Tobacco abuse: Ongoing. NicoDerm patch while hospitalized 6. DVT prophylaxis: Lovenox 7. GI prophylaxis: None The patient is a full code. Time spent on  admission orders and patient care approximately 32 min Likely discharge tomorrow      Code Status Orders        Start     Ordered   09/01/16 0922  Full code  Continuous     09/01/16 0922    Code Status History    Date Active Date Inactive Code Status Order ID Comments User Context   09/01/2016  9:22 AM 09/01/2016  1:13 PM Full Code [id number]  Harrie Foreman, MD Inpatient   05/03/2015  4:25 AM 05/07/2015  6:45 PM Full Code [id number]  Juluis Mire, MD ED   11/16/2013  2:35 AM 11/17/2013 12:49 PM Full Code [id number]  Rise Patience, MD Inpatient    Advance Directive Documentation   Flowsheet Row Most Recent Value  Type of Advance Directive  Healthcare Power of Attorney, Living will  Pre-existing out of facility DNR order (yellow form or pink MOST form)  No data  "MOST" Form in Place?  No data           Consults  Neuro DVT Prophylaxis  Lovenox   Lab Results  Component Value Date   PLT 216 09/01/2016     Time Spent in minutes  4min minutes  Greater than 50% of time spent in care coordination and counseling patient regarding the condition and plan of care.   Dustin Flock M.D on 09/02/2016 at 1:43 PM  Between 7am to 6pm - Pager - [phone]  After 6pm go to [email or web address] - password [email or web address] [facility]   Office  [phone]

## 2016-09-02 NOTE — Progress Notes (Addendum)
Subjective: Continues to complain of right sided numbness and tingling that he reports is worse on his face where he is completely numb.    Objective: Current vital signs: BP 126/79   Pulse 84   Temp 98 F (36.7 C) (Oral)   Resp 16   Ht 6' (1.829 m)   Wt 111.3 kg (245 lb 4.8 oz)   SpO2 91%   BMI 33.27 kg/m  Vital signs in last 24 hours: Temp:  [97.5 F (36.4 C)-98 F (36.7 C)] 98 F (36.7 C) (09/12 0433) Pulse Rate:  [83-88] 84 (09/12 0433) Resp:  [16-18] 16 (09/11 2012) BP: (126-144)/(79-96) 126/79 (09/12 0433) SpO2:  [91 %-96 %] 91 % (09/12 0433)  Intake/Output from previous day: 09/11 0701 - 09/12 0700 In: 840 [P.O.:840] Out: 1300 [Urine:1300] Intake/Output this shift: Total I/O In: -  Out: 300 [Urine:300] Nutritional status: Diet NPO time specified  Neurologic Exam: Mental Status: Alert, oriented, thought content appropriate.  Speech fluent without evidence of aphasia.  Able to follow 3 step commands without difficulty. Cranial Nerves: II: Discs flat bilaterally; Visual fields grossly normal, pupils equal, round, reactive to light and accommodation III,IV, VI: ptosis not present, extra-ocular motions intact bilaterally V,VII: smile symmetric, facial light touch sensation decreased on the right VIII: hearing normal bilaterally IX,X: gag reflex present XI: bilateral shoulder shrug XII: right tongue deviation Motor: Tremors of both upper extremities with testing, right greater than left.  When both arms tested together no focal upper extremity weakness noted.  Does not cooperate well for lower extremity testing due to pain.   Sensory: Pinprick and light touch decreased in the right upper and lower extremity  Lab Results: Basic Metabolic Panel:  Recent Labs Lab 09/01/16 0103  NA 135  K 4.5  CL 104  CO2 21*  GLUCOSE 115*  BUN 7  CREATININE 0.66  CALCIUM 8.9    Liver Function Tests: No results for input(s): AST, ALT, ALKPHOS, BILITOT, PROT, ALBUMIN in  the last 168 hours. No results for input(s): LIPASE, AMYLASE in the last 168 hours. No results for input(s): AMMONIA in the last 168 hours.  CBC:  Recent Labs Lab 09/01/16 0103  WBC 11.4*  HGB 18.4*  HCT 53.0*  MCV 89.0  PLT 216    Cardiac Enzymes:  Recent Labs Lab 09/01/16 1401 09/01/16 2116 09/02/16 0554  TROPONINI <0.03 <0.03 <0.03    Lipid Panel:  Recent Labs Lab 09/02/16 0554  CHOL 144  TRIG 171*  HDL 35*  CHOLHDL 4.1  VLDL 34  LDLCALC 75    CBG: No results for input(s): GLUCAP in the last 168 hours.  Microbiology: No results found for this or any previous visit.  Coagulation Studies:  Recent Labs  09/01/16 0103  LABPROT 12.9  INR 0.97    Imaging: Dg Chest 2 View  Result Date: 09/01/2016 CLINICAL DATA:  CVA EXAM: CHEST  2 VIEW COMPARISON:  08/13/2015 chest radiograph. FINDINGS: Stable cardiomediastinal silhouette with normal heart size. No pneumothorax. No pleural effusion. No pulmonary edema. Mild platelike atelectasis at the right lung base. IMPRESSION: Mild platelike atelectasis at the right lung base. Otherwise no active disease. Electronically Signed   By: Ilona Sorrel M.D.   On: 09/01/2016 12:09   Ct Head Wo Contrast  Result Date: 09/01/2016 CLINICAL DATA:  Numbness and tingling for several hours, uncertain of symptom onset time. EXAM: CT HEAD WITHOUT CONTRAST TECHNIQUE: Contiguous axial images were obtained from the base of the skull through the vertex without intravenous contrast.  COMPARISON:  Head CT 07/13/2015 FINDINGS: Brain: No evidence of acute infarction, hemorrhage, hydrocephalus, extra-axial collection or mass lesion/mass effect. Vascular: No hyperdense vessel or unexpected calcification. Atherosclerosis of skullbase vasculature. Skull: Normal. Negative for fracture or focal lesion. Sinuses/Orbits: Chronic paranasal sinus inflammation, stable from prior. No fluid level. Mastoid air cells are clear. IMPRESSION: No acute intracranial  abnormality. Electronically Signed   By: Jeb Levering M.D.   On: 09/01/2016 03:05   Mr Brain Wo Contrast  Result Date: 09/01/2016 CLINICAL DATA:  CVA.  New onset numbness and weakness on the right. EXAM: MRI HEAD WITHOUT CONTRAST MRA HEAD WITHOUT CONTRAST TECHNIQUE: Multiplanar, multiecho pulse sequences of the brain and surrounding structures were obtained without intravenous contrast. Angiographic images of the head were obtained using MRA technique without contrast. COMPARISON:  Brain MRI 05/05/2015 FINDINGS: MRI HEAD FINDINGS Calvarium and upper cervical spine: No focal marrow signal abnormality. Orbits: Negative. Sinuses and Mastoids: Patchy mucosal thickening throughout the paranasal sinuses, moderate in the right maxillary antrum. Inflammation has progressed since 2016. No fluid levels. Brain: No acute abnormality such as acute infarct, hemorrhage, hydrocephalus, or mass lesion. Few patchy FLAIR hyperintensities in the cerebral white matter, likely early chronic microvascular disease in this patient with vascular risk factors. MRA HEAD FINDINGS Symmetric carotid arteries and branching. Large posterior communicating arteries with fetal type PCA circulation. Intact anterior communicating artery. Right dominant vertebral artery with much of the left vertebral artery flow into PICA. The proximal V4 segments and PICAs are partially visualized. Small but smooth basilar in the setting of fetal type PCA circulation. No stenosis or major branch occlusion. Negative for aneurysm (bulbous appearance of the right inferior frontal branch is non aneurysmal based on multi projectional review in TeraRecon) or malformation. IMPRESSION: 1. No acute finding including infarct. 2. Negative intracranial MRA. 3. Chronic sinusitis, progressed from 2016. Electronically Signed   By: Monte Fantasia M.D.   On: 09/01/2016 12:55   US Carotid Bilateral (at [facility] Only)  Result Date: 09/01/2016 CLINICAL DATA:  55 year old  male with a history of cerebral vascular accident. Cardiovascular risk factors include hypertension, known prior stroke/ TIA, tobacco use, known vascular disease. EXAM: BILATERAL CAROTID DUPLEX ULTRASOUND TECHNIQUE: Pearline Cables scale imaging, color Doppler and duplex ultrasound were performed of bilateral carotid and vertebral arteries in the neck. COMPARISON:  05/03/2015 FINDINGS: Criteria: Quantification of carotid stenosis is based on velocity parameters that correlate the residual internal carotid diameter with NASCET-based stenosis levels, using the diameter of the distal internal carotid lumen as the denominator for stenosis measurement. The following velocity measurements were obtained: RIGHT ICA:  Systolic 79 cm/sec, Diastolic 34 cm/sec CCA:  95 cm/sec SYSTOLIC ICA/CCA RATIO:  0.8 ECA:  101 cm/sec LEFT ICA:  Systolic 76 cm/sec, Diastolic 35 cm/sec CCA:  0000000 cm/sec SYSTOLIC ICA/CCA RATIO:  0.7 ECA:  115 cm/sec Right Brachial SBP: Not acquired Left Brachial SBP: Not acquired RIGHT CAROTID ARTERY: No significant calcified disease of the right common carotid artery. Intermediate waveform maintained. Heterogeneous plaque without significant calcifications at the right carotid bifurcation. Low resistance waveform of the right ICA. No significant tortuosity. RIGHT VERTEBRAL ARTERY: Antegrade flow with low resistance waveform. LEFT CAROTID ARTERY: No significant calcified disease of the left common carotid artery. Intermediate waveform maintained. Heterogeneous plaque at the left carotid bifurcation without significant calcifications. Low resistance waveform of the left ICA. LEFT VERTEBRAL ARTERY:  Antegrade flow with low resistance waveform. IMPRESSION: Color duplex indicates minimal heterogeneous plaque, with no hemodynamically significant stenosis by duplex criteria in  the extracranial cerebrovascular circulation. Signed, Dulcy Fanny. Earleen Newport, DO [facility]  Electronically Signed   By: Corrie Mckusick D.O.   On: 09/01/2016 12:05   Dg Foot Complete Left  Result Date: 09/01/2016 CLINICAL DATA:  Severe left heel pain, nontraumatic EXAM: LEFT FOOT - COMPLETE 3+ VIEW COMPARISON:  None. FINDINGS: Negative for acute fracture or dislocation. There is a plantar calcaneal spur. No significant arthritic changes are evident. There is no bone lesion or bony destruction. No radiopaque foreign body. IMPRESSION: Plantar calcaneal spur. Electronically Signed   By: Andreas Newport M.D.   On: 09/01/2016 05:39   Mr Jodene Nam Head/brain [id number] Cm  Result Date: 09/01/2016 CLINICAL DATA:  CVA.  New onset numbness and weakness on the right. EXAM: MRI HEAD WITHOUT CONTRAST MRA HEAD WITHOUT CONTRAST TECHNIQUE: Multiplanar, multiecho pulse sequences of the brain and surrounding structures were obtained without intravenous contrast. Angiographic images of the head were obtained using MRA technique without contrast. COMPARISON:  Brain MRI 05/05/2015 FINDINGS: MRI HEAD FINDINGS Calvarium and upper cervical spine: No focal marrow signal abnormality. Orbits: Negative. Sinuses and Mastoids: Patchy mucosal thickening throughout the paranasal sinuses, moderate in the right maxillary antrum. Inflammation has progressed since 2016. No fluid levels. Brain: No acute abnormality such as acute infarct, hemorrhage, hydrocephalus, or mass lesion. Few patchy FLAIR hyperintensities in the cerebral white matter, likely early chronic microvascular disease in this patient with vascular risk factors. MRA HEAD FINDINGS Symmetric carotid arteries and branching. Large posterior communicating arteries with fetal type PCA circulation. Intact anterior communicating artery. Right dominant vertebral artery with much of the left vertebral artery flow into PICA. The proximal V4 segments and PICAs are partially visualized. Small but smooth basilar in the setting of fetal type PCA circulation. No stenosis or major branch occlusion.  Negative for aneurysm (bulbous appearance of the right inferior frontal branch is non aneurysmal based on multi projectional review in TeraRecon) or malformation. IMPRESSION: 1. No acute finding including infarct. 2. Negative intracranial MRA. 3. Chronic sinusitis, progressed from 2016. Electronically Signed   By: Monte Fantasia M.D.   On: 09/01/2016 12:55    Medications:  I have reviewed the patient's current medications. Scheduled: . aspirin EC  81 mg Oral Daily  . citalopram  20 mg Oral QHS  . enoxaparin (LOVENOX) injection  40 mg Subcutaneous Q24H  . lisinopril  10 mg Oral Daily  . nicotine  21 mg Transdermal Daily  . pantoprazole  40 mg Oral Daily  . [START ON 09/03/2016] predniSONE  40 mg Oral Q breakfast   Followed by  . [START ON 09/04/2016] predniSONE  30 mg Oral Q breakfast   Followed by  . [START ON 09/05/2016] predniSONE  20 mg Oral Q breakfast   Followed by  . [START ON 09/06/2016] predniSONE  10 mg Oral Q breakfast   Followed by  . [START ON 09/07/2016] predniSONE  5 mg Oral Q breakfast  . QUEtiapine  400 mg Oral QHS  . QUEtiapine  50 mg Oral Q1200    Assessment/Plan: Symptoms continue.  MRI of the brain unremarkable.  Although symptoms not consistent with a cord lesion will investigate further since patient's symptoms are not consistent.  Carotid dopplers show no evidence of hemodynamically significant stenosis.  Echocardiogram shows no cardiac source of emboli with an EF of 65%.  A1c pending, LDL 75.  Recommendations: 1.  MRI of the cervical spine 2.  Continue therapy   LOS: 1 day   Alexis Goodell,  MD Neurology [phone] 09/02/2016  11:23 AM

## 2016-09-02 NOTE — Progress Notes (Signed)
Occupational Therapy Treatment Patient Details Name: Ian [name] MRN: [id number] DOB: May 05, 1961 Today's Date: 09/02/2016    History of present illness Pt. is a 55 y.o. male who was admitted to [facility] with a new onset of R sided weakness. Pt reports approximately 50-100 falls in the last 12 month due to instability and lightheadedness   OT comments  Pt seen for RUE and hand neuromuscular re-ed and ADL retraining sitting EOB.  Pt reported that he did not sleep well last night but was agreeable to therapy.  Instructed in weight bearing activity and able to tolerate despite increased tingling in UE.  Given 2 foam utensil holders to use for self feeding and grooming to help with grasp pattern and coordination.   Discussed safety with showering at home and he indicated he has a garden tub with built in seat that is safer for him than using a shower chair in other tub at home.  Continue OT for established goals.  Follow Up Recommendations  Outpatient OT    Equipment Recommendations       Recommendations for Other Services      Precautions / Restrictions Precautions Precautions: Fall Restrictions Weight Bearing Restrictions: No       Mobility Bed Mobility                  Transfers                      Balance                                   ADL Overall ADL's : Needs assistance/impaired   Eating/Feeding Details (indicate cue type and reason): Pt seen for hand to mouth with R hand and given foam utensil holder to help increase coordination during self feeding.  Tremors in R hand and UE appear to be worse than yesterday when sitting EOB.                                            Vision                     Perception     Praxis      Cognition   Behavior During Therapy: WFL for tasks assessed/performed Overall Cognitive Status: Within Functional Limits for tasks assessed                        Extremity/Trunk Assessment               Exercises     Shoulder Instructions       General Comments      Pertinent Vitals/ Pain       Pain Assessment: 0-10 Pain Score: 8  Pain Location: L heel Pain Descriptors / Indicators: Sharp Pain Intervention(s): Limited activity within patient's tolerance;Monitored during session;Premedicated before session  Home Living                                          Prior Functioning/Environment              Frequency Min 1X/week     Progress Toward Goals  OT  Goals(current goals can now be found in the care plan section)  Progress towards OT goals: Progressing toward goals  Acute Rehab OT Goals Patient Stated Goal: Return to prior level of function at home OT Goal Formulation: With patient Time For Goal Achievement: 09/01/16 Potential to Achieve Goals: Good  Plan Discharge plan remains appropriate    Co-evaluation                 End of Session     Activity Tolerance Patient tolerated treatment well   Patient Left in bed;with call bell/phone within reach;with bed alarm set   Nurse Communication          Time: [id number] OT Time Calculation (min): 45 min  Charges: OT General Charges $OT Visit: 1 Procedure OT Treatments $Self Care/Home Management : 8-22 mins $Therapeutic Activity: 8-22 mins $Neuromuscular Re-education: 8-22 mins  Chrys Racer, OTR/L ascom [phone] 09/02/16, 11:52 AM

## 2016-09-02 NOTE — Progress Notes (Signed)
Speech Therapy Note: reviewed chart notes, consulted NSG and pt. Noted MRI results - negative. Pt has no swallowing or speech-language deficits at this time. ST services will sign off w/ NSG to reconsult if any change in status while admitted.

## 2016-09-03 MED ORDER — ATORVASTATIN CALCIUM 40 MG PO TABS: 40.0000 mg | ORAL_TABLET | Freq: Every day | ORAL | 0 refills | Status: DC

## 2016-09-03 NOTE — Consult Note (Signed)
ORTHOPAEDIC CONSULTATION  REQUESTING PHYSICIAN: Dustin Flock, MD  Chief Complaint: Left heel pain  HPI: Ian [name] is a 55 y.o. male who complains of  left heel pain for several days.  It got to the point where he can hardly walk prior to being admitted here at [facility] for a CVA.  No trauma to the foot.  This is interfering with his rehabilitation.  X-rays showed a heel spur but no other significant pathology.  I recommended a cortisone injection and he agrees to this.  Risks and benefits were discussed.  Past Medical History:  Diagnosis Date  . Anxiety   . Barrett esophagus   . Cancer ([place])   . Coronary artery disease   . Depression   . GERD (gastroesophageal reflux disease)   . Hypertension   . Stroke [facility]) 2014   "mini-stroke" per patient   Past Surgical History:  Procedure Laterality Date  . ANGIOPLASTY    . APPENDECTOMY    . CARDIAC CATHETERIZATION    . CHOLECYSTECTOMY    . WRIST SURGERY Right    age 48   Social History   Social History  . Marital status: Single    Spouse name: N/A  . Number of children: N/A  . Years of education: N/A   Social History Main Topics  . Smoking status: Current Every Day Smoker    Packs/day: 1.00    Types: Cigarettes  . Smokeless tobacco: Never Used  . Alcohol use Yes  . Drug use:     Types: Cocaine, Marijuana  . Sexual activity: Not Asked   Other Topics Concern  . None   Social History Narrative  . None   Family History  Problem Relation Age of Onset  . Stroke Father   . Leukemia Father   . Breast cancer Sister    Allergies  Allergen Reactions  . Fluoxetine Swelling    Anxiety, itching  . Hydrocodone-Acetaminophen Itching   Prior to Admission medications   Medication Sig Start Date End Date Taking? Authorizing Provider  acetaminophen-codeine (TYLENOL #3) 300-30 MG tablet Take 1 tablet by mouth every 4 (four) hours as needed for pain.   Yes Historical Provider, MD  citalopram (CELEXA) 20 MG tablet Take 20 mg  by mouth at bedtime. 04/12/15  Yes Historical Provider, MD  clonazePAM (KLONOPIN) 0.5 MG tablet Take 0.5 mg by mouth 2 (two) times daily as needed for anxiety.    Yes Historical Provider, MD  hydrOXYzine (ATARAX/VISTARIL) 50 MG tablet Take 1 tablet by mouth 2 (two) times daily as needed. 03/01/15  Yes Historical Provider, MD  lisinopril (PRINIVIL,ZESTRIL) 10 MG tablet Take 10 mg by mouth daily. 03/01/15  Yes Historical Provider, MD  pantoprazole (PROTONIX) 40 MG tablet Take 40 mg by mouth daily.   Yes Historical Provider, MD  PROAIR HFA 108 (90 BASE) MCG/ACT inhaler Inhale 1-2 puffs into the lungs every 6 (six) hours as needed for wheezing or shortness of breath.    Yes Historical Provider, MD  QUEtiapine (SEROQUEL) 300 MG tablet Take 1 tablet (300 mg total) by mouth at bedtime. Patient taking differently: Take 400 mg by mouth at bedtime.  11/17/13  Yes Nita Sells, MD  QUEtiapine (SEROQUEL) 50 MG tablet Take 1 tablet (50 mg total) by mouth daily at 12 noon. 11/17/13  Yes Nita Sells, MD  aspirin EC 81 MG EC tablet Take 1 tablet (81 mg total) by mouth daily. Patient not taking: Reported on 09/01/2016 05/07/15   Fritzi Mandes, MD  atorvastatin (  LIPITOR) 40 MG tablet Take 1 tablet (40 mg total) by mouth daily at 6 PM. 09/03/16 10/03/16  Dustin Flock, MD  gabapentin (NEURONTIN) 100 MG capsule Take 1 capsule (100 mg total) by mouth 3 (three) times daily. 08/13/15 08/12/16  Nance Pear, MD  ondansetron (ZOFRAN) 4 MG tablet Take 1 tablet (4 mg total) by mouth daily as needed for nausea or vomiting. Patient not taking: Reported on 09/01/2016 07/09/15   Ahmed Prima, MD  oxyCODONE-acetaminophen (ROXICET) 5-325 MG per tablet Take 1 tablet by mouth every 6 (six) hours as needed. Patient not taking: Reported on 09/01/2016 07/09/15   Ahmed Prima, MD  thiamine 50 MG tablet Take 1 tablet (50 mg total) by mouth daily. Patient not taking: Reported on 09/01/2016 05/07/15   Fritzi Mandes, MD   Dg Chest  2 View  Result Date: 09/01/2016 CLINICAL DATA:  CVA EXAM: CHEST  2 VIEW COMPARISON:  08/13/2015 chest radiograph. FINDINGS: Stable cardiomediastinal silhouette with normal heart size. No pneumothorax. No pleural effusion. No pulmonary edema. Mild platelike atelectasis at the right lung base. IMPRESSION: Mild platelike atelectasis at the right lung base. Otherwise no active disease. Electronically Signed   By: Ilona Sorrel M.D.   On: 09/01/2016 12:09   Mr Brain Wo Contrast  Result Date: 09/01/2016 CLINICAL DATA:  CVA.  New onset numbness and weakness on the right. EXAM: MRI HEAD WITHOUT CONTRAST MRA HEAD WITHOUT CONTRAST TECHNIQUE: Multiplanar, multiecho pulse sequences of the brain and surrounding structures were obtained without intravenous contrast. Angiographic images of the head were obtained using MRA technique without contrast. COMPARISON:  Brain MRI 05/05/2015 FINDINGS: MRI HEAD FINDINGS Calvarium and upper cervical spine: No focal marrow signal abnormality. Orbits: Negative. Sinuses and Mastoids: Patchy mucosal thickening throughout the paranasal sinuses, moderate in the right maxillary antrum. Inflammation has progressed since 2016. No fluid levels. Brain: No acute abnormality such as acute infarct, hemorrhage, hydrocephalus, or mass lesion. Few patchy FLAIR hyperintensities in the cerebral white matter, likely early chronic microvascular disease in this patient with vascular risk factors. MRA HEAD FINDINGS Symmetric carotid arteries and branching. Large posterior communicating arteries with fetal type PCA circulation. Intact anterior communicating artery. Right dominant vertebral artery with much of the left vertebral artery flow into PICA. The proximal V4 segments and PICAs are partially visualized. Small but smooth basilar in the setting of fetal type PCA circulation. No stenosis or major branch occlusion. Negative for aneurysm (bulbous appearance of the right inferior frontal branch is non  aneurysmal based on multi projectional review in TeraRecon) or malformation. IMPRESSION: 1. No acute finding including infarct. 2. Negative intracranial MRA. 3. Chronic sinusitis, progressed from 2016. Electronically Signed   By: Monte Fantasia M.D.   On: 09/01/2016 12:55   Mr Cervical Spine Wo Contrast  Result Date: 09/02/2016 CLINICAL DATA:  55 year old male with new onset right side numbness and weakness. Initial encounter. EXAM: MRI CERVICAL SPINE WITHOUT CONTRAST TECHNIQUE: Multiplanar, multisequence MR imaging of the cervical spine was performed. No intravenous contrast was administered. COMPARISON:  Brain MRI and intracranial MRA a 09/01/2016. Cervical spine MRI 05/04/2015. FINDINGS: Alignment: Stable since 2016, relatively preserved cervical lordosis. Vertebrae: No marrow edema or evidence of acute osseous abnormality. Incidental C5 superior endplate benign vertebral hemangioma re- demonstrated. Cord: Spinal cord signal is within normal limits at all visualized levels. Posterior Fossa, vertebral arteries, paraspinal tissues: Cervicomedullary junction is within normal limits. Sub cm T2 hyperintense bilateral thyroid nodules do not meet consensus criteria for ultrasound follow-up. Otherwise negative neck  soft tissues. Preserved major vascular flow voids. Mildly dominant right vertebral artery. Disc levels: C2-C3:  Mild left facet and uncovertebral hypertrophy. C3-C4:  Mild facet hypertrophy. C4-C5:  Mild facet hypertrophy. C5-C6: Mild circumferential disc bulge. Broad-based posterior component. Mild uncovertebral and facet hypertrophy. No spinal or significant neural foraminal stenosis. C6-C7: Minimal disc bulge. Small central annular fissure of the disc (series 6, image 24). No stenosis. C7-T1:  Negative. No upper thoracic spinal stenosis is evident. IMPRESSION: Mild for age cervical spine degeneration is stable since 2016 with no associated spinal stenosis or convincing neural impingement.  Electronically Signed   By: Genevie Ann M.D.   On: 09/02/2016 13:23   US Carotid Bilateral (at [facility] And [facility] Only)  Result Date: 09/01/2016 CLINICAL DATA:  55 year old male with a history of cerebral vascular accident. Cardiovascular risk factors include hypertension, known prior stroke/ TIA, tobacco use, known vascular disease. EXAM: BILATERAL CAROTID DUPLEX ULTRASOUND TECHNIQUE: Pearline Cables scale imaging, color Doppler and duplex ultrasound were performed of bilateral carotid and vertebral arteries in the neck. COMPARISON:  05/03/2015 FINDINGS: Criteria: Quantification of carotid stenosis is based on velocity parameters that correlate the residual internal carotid diameter with NASCET-based stenosis levels, using the diameter of the distal internal carotid lumen as the denominator for stenosis measurement. The following velocity measurements were obtained: RIGHT ICA:  Systolic 79 cm/sec, Diastolic 34 cm/sec CCA:  95 cm/sec SYSTOLIC ICA/CCA RATIO:  0.8 ECA:  101 cm/sec LEFT ICA:  Systolic 76 cm/sec, Diastolic 35 cm/sec CCA:  0000000 cm/sec SYSTOLIC ICA/CCA RATIO:  0.7 ECA:  115 cm/sec Right Brachial SBP: Not acquired Left Brachial SBP: Not acquired RIGHT CAROTID ARTERY: No significant calcified disease of the right common carotid artery. Intermediate waveform maintained. Heterogeneous plaque without significant calcifications at the right carotid bifurcation. Low resistance waveform of the right ICA. No significant tortuosity. RIGHT VERTEBRAL ARTERY: Antegrade flow with low resistance waveform. LEFT CAROTID ARTERY: No significant calcified disease of the left common carotid artery. Intermediate waveform maintained. Heterogeneous plaque at the left carotid bifurcation without significant calcifications. Low resistance waveform of the left ICA. LEFT VERTEBRAL ARTERY:  Antegrade flow with low resistance waveform. IMPRESSION: Color duplex indicates minimal heterogeneous plaque, with no hemodynamically significant stenosis by duplex  criteria in the extracranial cerebrovascular circulation. Signed, Dulcy Fanny. Earleen Newport, DO [facility] Electronically Signed   By: Corrie Mckusick D.O.   On: 09/01/2016 12:05   Nm Myocar Multi W/spect W/wall Motion / Ef  Result Date: 09/02/2016 Pharmacological myocardial perfusion imaging study with no significant  ischemia Normal wall motion, EF estimated at 56% No EKG changes concerning for ischemia at peak stress or in recovery. Low risk scan Signed, Esmond Plants, MD, Ph.D [facility]   Mr Ian [name] Head/brain Wo Cm  Result Date: 09/01/2016 CLINICAL DATA:  CVA.  New onset numbness and weakness on the right. EXAM: MRI HEAD WITHOUT CONTRAST MRA HEAD WITHOUT CONTRAST TECHNIQUE: Multiplanar, multiecho pulse sequences of the brain and surrounding structures were obtained without intravenous contrast. Angiographic images of the head were obtained using MRA technique without contrast. COMPARISON:  Brain MRI 05/05/2015 FINDINGS: MRI HEAD FINDINGS Calvarium and upper cervical spine: No focal marrow signal abnormality. Orbits: Negative. Sinuses and Mastoids: Patchy mucosal thickening throughout the paranasal sinuses, moderate in the right maxillary antrum. Inflammation has progressed since 2016. No fluid levels. Brain: No acute abnormality such as acute infarct, hemorrhage, hydrocephalus, or mass lesion. Few patchy FLAIR hyperintensities in the cerebral white matter, likely early chronic microvascular disease  in this patient with vascular risk factors. MRA HEAD FINDINGS Symmetric carotid arteries and branching. Large posterior communicating arteries with fetal type PCA circulation. Intact anterior communicating artery. Right dominant vertebral artery with much of the left vertebral artery flow into PICA. The proximal V4 segments and PICAs are partially visualized. Small but smooth basilar in the setting of fetal type PCA circulation. No stenosis or major branch  occlusion. Negative for aneurysm (bulbous appearance of the right inferior frontal branch is non aneurysmal based on multi projectional review in TeraRecon) or malformation. IMPRESSION: 1. No acute finding including infarct. 2. Negative intracranial MRA. 3. Chronic sinusitis, progressed from 2016. Electronically Signed   By: Monte Fantasia M.D.   On: 09/01/2016 12:55    Positive ROS: All other systems have been reviewed and were otherwise negative with the exception of those mentioned in the HPI and as above.  Physical Exam: General: Alert, no acute distress Cardiovascular: No pedal edema Respiratory: No cyanosis, no use of accessory musculature GI: No organomegaly, abdomen is soft and non-tender Skin: No lesions in the area of chief complaint Neurologic: Sensation intact distally Psychiatric: Patient is competent for consent with normal mood and affect Lymphatic: No axillary or cervical lymphadenopathy  MUSCULOSKELETAL: Patient has severe tenderness over the plantar aspect of the left heel.  He has a high arch.  Ankle and subtalar motion is normal.  Neurovascular status is normal in the foot.  Assessment: Left heel pain from plantar fasciitis  Plan: I injected the left heel plantar fascia origin sterilely with Xylocaine and Celestone.  He will ice this.  He will return to my office as needed.  3 or 4 weeks.  Hopefully the inflammation will improve the next day or 2.    Park Breed, MD [phone]   09/03/2016 10:53 AM

## 2016-09-03 NOTE — Discharge Instructions (Signed)
°  DIET:  Cardiac diet  DISCHARGE CONDITION:  Stable  ACTIVITY:  Activity as tolerated  OXYGEN:  Home Oxygen: No.   Oxygen Delivery: room air  DISCHARGE LOCATION:  home    ADDITIONAL DISCHARGE INSTRUCTION: Primary md  To arrange ot  If you experience worsening of your admission symptoms, develop shortness of breath, life threatening emergency, suicidal or homicidal thoughts you must seek medical attention immediately by calling 911 or calling your MD immediately  if symptoms less severe.  You Must read complete instructions/literature along with all the possible adverse reactions/side effects for all the Medicines you take and that have been prescribed to you. Take any new Medicines after you have completely understood and accpet all the possible adverse reactions/side effects.   Please note  You were cared for by a hospitalist during your hospital stay. If you have any questions about your discharge medications or the care you received while you were in the hospital after you are discharged, you can call the unit and asked to speak with the hospitalist on call if the hospitalist that took care of you is not available. Once you are discharged, your primary care physician will handle any further medical issues. Please note that NO REFILLS for any discharge medications will be authorized once you are discharged, as it is imperative that you return to your primary care physician (or establish a relationship with a primary care physician if you do not have one) for your aftercare needs so that they can reassess your need for medications and monitor your lab values.

## 2016-09-03 NOTE — Discharge Summary (Signed)
Ian [name], 55 y.o., DOB 09-Aug-1961, MRN [id number]. Admission date: 09/01/2016 Discharge Date 09/03/2016 Primary MD [place] Admitting Physician Harrie Foreman, MD  Admission Diagnosis  Foot pain, left [M79.672] Cerebrovascular accident (CVA), unspecified mechanism ([place]) [I63.9]  Discharge Diagnosis   Active Problems:   Right sided weakness of unclear etiology now improved no evidence of CVA   Chest pain with negative stress test   Left heel pain due to plantar fasciitis   Anxiety Barrett's esophagus Coronary artery disease Depression GERD Essential hypertension      Hospital Course patient is a 55 year old white male who presented to the hospital with complaint of facial lobe is the right side as well as right upper extremity right lower extremity numbness and weakness. Initially was thought to have a CVA. However workup with MRI of the brain was negative for an acute stroke. He was seen by neurology his symptoms did not correlate with this physical exam and his MRI of the brain findings. Due to persistent complaint on the right upper extremity and right lower extremity underwent a C-spine MRI which showed age related changes. Patient was also complaining of left-sided chest pain and due to history of coronary artery disease he underwent a stress test which was normal. It showed no evidence of ischemia. Patient also was complaining of pain on the left heel he was seen by orthopedics he was diagnosed with plantar fasciitis and received a steroid injection. He was seen by physical therapy and occupational therapy they recommended outpatient occupational therapy which his primary care provider needs to arrange.           Consults  orthopedic surgery  Significant Tests:  See full reports for all details     Dg Chest 2 View  Result Date: 09/01/2016 CLINICAL DATA:  CVA EXAM: CHEST  2 VIEW COMPARISON:  08/13/2015 chest radiograph. FINDINGS: Stable  cardiomediastinal silhouette with normal heart size. No pneumothorax. No pleural effusion. No pulmonary edema. Mild platelike atelectasis at the right lung base. IMPRESSION: Mild platelike atelectasis at the right lung base. Otherwise no active disease. Electronically Signed   By: Ilona Sorrel M.D.   On: 09/01/2016 12:09   Ct Head Wo Contrast  Result Date: 09/01/2016 CLINICAL DATA:  Numbness and tingling for several hours, uncertain of symptom onset time. EXAM: CT HEAD WITHOUT CONTRAST TECHNIQUE: Contiguous axial images were obtained from the base of the skull through the vertex without intravenous contrast. COMPARISON:  Head CT 07/13/2015 FINDINGS: Brain: No evidence of acute infarction, hemorrhage, hydrocephalus, extra-axial collection or mass lesion/mass effect. Vascular: No hyperdense vessel or unexpected calcification. Atherosclerosis of skullbase vasculature. Skull: Normal. Negative for fracture or focal lesion. Sinuses/Orbits: Chronic paranasal sinus inflammation, stable from prior. No fluid level. Mastoid air cells are clear. IMPRESSION: No acute intracranial abnormality. Electronically Signed   By: Jeb Levering M.D.   On: 09/01/2016 03:05   Mr Brain Wo Contrast  Result Date: 09/01/2016 CLINICAL DATA:  CVA.  New onset numbness and weakness on the right. EXAM: MRI HEAD WITHOUT CONTRAST MRA HEAD WITHOUT CONTRAST TECHNIQUE: Multiplanar, multiecho pulse sequences of the brain and surrounding structures were obtained without intravenous contrast. Angiographic images of the head were obtained using MRA technique without contrast. COMPARISON:  Brain MRI 05/05/2015 FINDINGS: MRI HEAD FINDINGS Calvarium and upper cervical spine: No focal marrow signal abnormality. Orbits: Negative. Sinuses and Mastoids: Patchy mucosal thickening throughout the paranasal sinuses, moderate in the right maxillary antrum. Inflammation has progressed since 2016. No fluid levels.  Brain: No acute abnormality such as acute  infarct, hemorrhage, hydrocephalus, or mass lesion. Few patchy FLAIR hyperintensities in the cerebral white matter, likely early chronic microvascular disease in this patient with vascular risk factors. MRA HEAD FINDINGS Symmetric carotid arteries and branching. Large posterior communicating arteries with fetal type PCA circulation. Intact anterior communicating artery. Right dominant vertebral artery with much of the left vertebral artery flow into PICA. The proximal V4 segments and PICAs are partially visualized. Small but smooth basilar in the setting of fetal type PCA circulation. No stenosis or major branch occlusion. Negative for aneurysm (bulbous appearance of the right inferior frontal branch is non aneurysmal based on multi projectional review in TeraRecon) or malformation. IMPRESSION: 1. No acute finding including infarct. 2. Negative intracranial MRA. 3. Chronic sinusitis, progressed from 2016. Electronically Signed   By: Monte Fantasia M.D.   On: 09/01/2016 12:55   Mr Cervical Spine Wo Contrast  Result Date: 09/02/2016 CLINICAL DATA:  55 year old male with new onset right side numbness and weakness. Initial encounter. EXAM: MRI CERVICAL SPINE WITHOUT CONTRAST TECHNIQUE: Multiplanar, multisequence MR imaging of the cervical spine was performed. No intravenous contrast was administered. COMPARISON:  Brain MRI and intracranial MRA a 09/01/2016. Cervical spine MRI 05/04/2015. FINDINGS: Alignment: Stable since 2016, relatively preserved cervical lordosis. Vertebrae: No marrow edema or evidence of acute osseous abnormality. Incidental C5 superior endplate benign vertebral hemangioma re- demonstrated. Cord: Spinal cord signal is within normal limits at all visualized levels. Posterior Fossa, vertebral arteries, paraspinal tissues: Cervicomedullary junction is within normal limits. Sub cm T2 hyperintense bilateral thyroid nodules do not meet consensus criteria for ultrasound follow-up. Otherwise negative  neck soft tissues. Preserved major vascular flow voids. Mildly dominant right vertebral artery. Disc levels: C2-C3:  Mild left facet and uncovertebral hypertrophy. C3-C4:  Mild facet hypertrophy. C4-C5:  Mild facet hypertrophy. C5-C6: Mild circumferential disc bulge. Broad-based posterior component. Mild uncovertebral and facet hypertrophy. No spinal or significant neural foraminal stenosis. C6-C7: Minimal disc bulge. Small central annular fissure of the disc (series 6, image 24). No stenosis. C7-T1:  Negative. No upper thoracic spinal stenosis is evident. IMPRESSION: Mild for age cervical spine degeneration is stable since 2016 with no associated spinal stenosis or convincing neural impingement. Electronically Signed   By: Genevie Ann M.D.   On: 09/02/2016 13:23   US Carotid Bilateral (at [facility] Only)  Result Date: 09/01/2016 CLINICAL DATA:  55 year old male with a history of cerebral vascular accident. Cardiovascular risk factors include hypertension, known prior stroke/ TIA, tobacco use, known vascular disease. EXAM: BILATERAL CAROTID DUPLEX ULTRASOUND TECHNIQUE: Pearline Cables scale imaging, color Doppler and duplex ultrasound were performed of bilateral carotid and vertebral arteries in the neck. COMPARISON:  05/03/2015 FINDINGS: Criteria: Quantification of carotid stenosis is based on velocity parameters that correlate the residual internal carotid diameter with NASCET-based stenosis levels, using the diameter of the distal internal carotid lumen as the denominator for stenosis measurement. The following velocity measurements were obtained: RIGHT ICA:  Systolic 79 cm/sec, Diastolic 34 cm/sec CCA:  95 cm/sec SYSTOLIC ICA/CCA RATIO:  0.8 ECA:  101 cm/sec LEFT ICA:  Systolic 76 cm/sec, Diastolic 35 cm/sec CCA:  0000000 cm/sec SYSTOLIC ICA/CCA RATIO:  0.7 ECA:  115 cm/sec Right Brachial SBP: Not acquired Left Brachial SBP: Not acquired RIGHT CAROTID ARTERY: No significant calcified disease of the right common carotid  artery. Intermediate waveform maintained. Heterogeneous plaque without significant calcifications at the right carotid bifurcation. Low resistance waveform of the right ICA. No significant tortuosity. RIGHT VERTEBRAL  ARTERY: Antegrade flow with low resistance waveform. LEFT CAROTID ARTERY: No significant calcified disease of the left common carotid artery. Intermediate waveform maintained. Heterogeneous plaque at the left carotid bifurcation without significant calcifications. Low resistance waveform of the left ICA. LEFT VERTEBRAL ARTERY:  Antegrade flow with low resistance waveform. IMPRESSION: Color duplex indicates minimal heterogeneous plaque, with no hemodynamically significant stenosis by duplex criteria in the extracranial cerebrovascular circulation. Signed, Dulcy Fanny. Earleen Newport, DO [facility] Electronically Signed   By: Corrie Mckusick D.O.   On: 09/01/2016 12:05   Nm Myocar Multi W/spect W/wall Motion / Ef  Result Date: 09/02/2016 Pharmacological myocardial perfusion imaging study with no significant  ischemia Normal wall motion, EF estimated at 56% No EKG changes concerning for ischemia at peak stress or in recovery. Low risk scan Signed, Esmond Plants, MD, Ph.D [facility]   Dg Foot Complete Left  Result Date: 09/01/2016 CLINICAL DATA:  Severe left heel pain, nontraumatic EXAM: LEFT FOOT - COMPLETE 3+ VIEW COMPARISON:  None. FINDINGS: Negative for acute fracture or dislocation. There is a plantar calcaneal spur. No significant arthritic changes are evident. There is no bone lesion or bony destruction. No radiopaque foreign body. IMPRESSION: Plantar calcaneal spur. Electronically Signed   By: Andreas Newport M.D.   On: 09/01/2016 05:39   Mr Jodene Nam Head/brain [id number] Cm  Result Date: 09/01/2016 CLINICAL DATA:  CVA.  New onset numbness and weakness on the right. EXAM: MRI HEAD WITHOUT CONTRAST MRA HEAD WITHOUT CONTRAST TECHNIQUE: Multiplanar,  multiecho pulse sequences of the brain and surrounding structures were obtained without intravenous contrast. Angiographic images of the head were obtained using MRA technique without contrast. COMPARISON:  Brain MRI 05/05/2015 FINDINGS: MRI HEAD FINDINGS Calvarium and upper cervical spine: No focal marrow signal abnormality. Orbits: Negative. Sinuses and Mastoids: Patchy mucosal thickening throughout the paranasal sinuses, moderate in the right maxillary antrum. Inflammation has progressed since 2016. No fluid levels. Brain: No acute abnormality such as acute infarct, hemorrhage, hydrocephalus, or mass lesion. Few patchy FLAIR hyperintensities in the cerebral white matter, likely early chronic microvascular disease in this patient with vascular risk factors. MRA HEAD FINDINGS Symmetric carotid arteries and branching. Large posterior communicating arteries with fetal type PCA circulation. Intact anterior communicating artery. Right dominant vertebral artery with much of the left vertebral artery flow into PICA. The proximal V4 segments and PICAs are partially visualized. Small but smooth basilar in the setting of fetal type PCA circulation. No stenosis or major branch occlusion. Negative for aneurysm (bulbous appearance of the right inferior frontal branch is non aneurysmal based on multi projectional review in TeraRecon) or malformation. IMPRESSION: 1. No acute finding including infarct. 2. Negative intracranial MRA. 3. Chronic sinusitis, progressed from 2016. Electronically Signed   By: Monte Fantasia M.D.   On: 09/01/2016 12:55       Today   Subjective:   Ian [name]  patient states that his right sided weakness and numbness is improved. Significantly. He still has some pain in the left heel.  Objective:   Blood pressure 109/79, pulse 73, temperature 98 F (36.7 C), temperature source Oral, resp. rate 16, height 6' (1.829 m), weight 245 lb 4.8 oz (111.3 kg), SpO2 95 %.  .  Intake/Output Summary  (Last 24 hours) at 09/03/16 1159 Last data filed at 09/03/16 1024  Gross per 24 hour  Intake              120 ml  Output  950 ml  Net             -830 ml    Exam VITAL SIGNS: Blood pressure 109/79, pulse 73, temperature 98 F (36.7 C), temperature source Oral, resp. rate 16, height 6' (1.829 m), weight 245 lb 4.8 oz (111.3 kg), SpO2 95 %.  GENERAL:  55 y.o.-year-old patient lying in the bed with no acute distress.  EYES: Pupils equal, round, reactive to light and accommodation. No scleral icterus. Extraocular muscles intact.  HEENT: Head atraumatic, normocephalic. Oropharynx and nasopharynx clear.  NECK:  Supple, no jugular venous distention. No thyroid enlargement, no tenderness.  LUNGS: Normal breath sounds bilaterally, no wheezing, rales,rhonchi or crepitation. No use of accessory muscles of respiration.  CARDIOVASCULAR: S1, S2 normal. No murmurs, rubs, or gallops.  ABDOMEN: Soft, nontender, nondistended. Bowel sounds present. No organomegaly or mass.  EXTREMITIES: No pedal edema, cyanosis, or clubbing.  NEUROLOGIC: Cranial nerves II through XII are intact. Muscle strength 5/5 in all extremities. Sensation intact. Gait not checked.  PSYCHIATRIC: The patient is alert and oriented x 3.  SKIN: No obvious rash, lesion, or ulcer.   Data Review     CBC w Diff: Lab Results  Component Value Date   WBC 11.4 (H) 09/01/2016   HGB 18.4 (H) 09/01/2016   HGB 18.2 (H) 12/02/2014   HCT 53.0 (H) 09/01/2016   HCT 53.9 (H) 12/02/2014   PLT 216 09/01/2016   PLT 173 12/02/2014   LYMPHOPCT 30 07/08/2015   LYMPHOPCT 38.9 07/08/2014   MONOPCT 7 07/08/2015   MONOPCT 6.5 07/08/2014   EOSPCT 1 07/08/2015   EOSPCT 1.3 07/08/2014   BASOPCT 1 07/08/2015   BASOPCT 1.9 07/08/2014   CMP: Lab Results  Component Value Date   NA 135 09/01/2016   NA 139 12/02/2014   K 4.5 09/01/2016   K 3.6 12/02/2014   CL 104 09/01/2016   CL 105 12/02/2014   CO2 21 (L) 09/01/2016   CO2 23 12/02/2014    BUN 7 09/01/2016   BUN 10 12/02/2014   CREATININE 0.66 09/01/2016   CREATININE 1.00 12/02/2014   PROT 7.2 08/13/2015   PROT 7.5 07/08/2014   ALBUMIN 4.1 08/13/2015   ALBUMIN 3.7 07/08/2014   BILITOT 0.4 08/13/2015   BILITOT 0.4 07/08/2014   ALKPHOS 64 08/13/2015   ALKPHOS 70 07/08/2014   AST 52 (H) 08/13/2015   AST 45 (H) 07/08/2014   ALT 67 (H) 08/13/2015   ALT 61 07/08/2014  .  Micro Results No results found for this or any previous visit (from the past 240 hour(s)).      Code Status Orders        Start     Ordered   09/01/16 0922  Full code  Continuous     09/01/16 0922    Code Status History    Date Active Date Inactive Code Status Order ID Comments User Context   09/01/2016  9:22 AM 09/01/2016  1:13 PM Full Code [id number]  Harrie Foreman, MD Inpatient   05/03/2015  4:25 AM 05/07/2015  6:45 PM Full Code [id number]  Juluis Mire, MD ED   11/16/2013  2:35 AM 11/17/2013 12:49 PM Full Code [id number]  Rise Patience, MD Inpatient    Advance Directive Documentation   Flowsheet Row Most Recent Value  Type of Advance Directive  Healthcare Power of Attorney, Living will  Pre-existing out of facility DNR order (yellow form or pink MOST form)  No data  "MOST" Form in Place?  No data          Follow-up Information    Park Breed, MD. Go on 09/10/2016.   Specialty:  Specialist Why:  @1 :00 PM Contact information: [place] [address] [phone]        [place]. Go on 09/11/2016.   Specialty:  Family Medicine Why:  @10 :20 AM with Dr. Elberta Fortis information: [place] [address] [phone]           Discharge Medications     Medication List    TAKE these medications   acetaminophen-codeine 300-30 MG tablet Commonly known as:  TYLENOL #3 Take 1 tablet by mouth every 4 (four) hours as needed for pain.   aspirin 81 MG EC tablet Take 1 tablet (81 mg  total) by mouth daily.   atorvastatin 40 MG tablet Commonly known as:  LIPITOR Take 1 tablet (40 mg total) by mouth daily at 6 PM.   citalopram 20 MG tablet Commonly known as:  CELEXA Take 20 mg by mouth at bedtime.   clonazePAM 0.5 MG tablet Commonly known as:  KLONOPIN Take 0.5 mg by mouth 2 (two) times daily as needed for anxiety.   gabapentin 100 MG capsule Commonly known as:  NEURONTIN Take 1 capsule (100 mg total) by mouth 3 (three) times daily.   hydrOXYzine 50 MG tablet Commonly known as:  ATARAX/VISTARIL Take 1 tablet by mouth 2 (two) times daily as needed.   lisinopril 10 MG tablet Commonly known as:  PRINIVIL,ZESTRIL Take 10 mg by mouth daily.   ondansetron 4 MG tablet Commonly known as:  ZOFRAN Take 1 tablet (4 mg total) by mouth daily as needed for nausea or vomiting.   oxyCODONE-acetaminophen 5-325 MG tablet Commonly known as:  ROXICET Take 1 tablet by mouth every 6 (six) hours as needed.   pantoprazole 40 MG tablet Commonly known as:  PROTONIX Take 40 mg by mouth daily.   PROAIR HFA 108 (90 Base) MCG/ACT inhaler Generic drug:  albuterol Inhale 1-2 puffs into the lungs every 6 (six) hours as needed for wheezing or shortness of breath.   QUEtiapine 300 MG tablet Commonly known as:  SEROQUEL Take 1 tablet (300 mg total) by mouth at bedtime. What changed:  how much to take   QUEtiapine 50 MG tablet Commonly known as:  SEROQUEL Take 1 tablet (50 mg total) by mouth daily at 12 noon. What changed:  Another medication with the same name was changed. Make sure you understand how and when to take each.   thiamine 50 MG tablet Take 1 tablet (50 mg total) by mouth daily.          Total Time in preparing paper work, data evaluation and todays exam - 35 minutes  Dustin Flock M.D on 09/03/2016 at [place] AM  [place]  [phone]

## 2016-09-03 NOTE — Progress Notes (Signed)
Occupational Therapy Treatment Patient Details Name: Ian [name] MRN: [id number] DOB: Jun 03, 1961 Today's Date: 09/03/2016    History of present illness Pt. is a 54 y.o. male who was admitted to [facility] with a new onset of R sided weakness. Pt reports approximately 50-100 falls in the last 12 month due to instability and lightheadedness   OT comments  Pt seen for ADL retraining and HEP for RUE and hand.  He stated that self feeding is improved using foam utensil holder that was given yesterday and tremor in RUE and hand improved.  Given green theraputty with written exercises which were reviewed with rec to end with extension and weight bearing through wrist and arm.  Also reviewed handout for fine motor exercises for R hand.  Good follow through from patient.  Rec when he comes for outpatient OT and PT that he use a wheelchair since therapy gym is downstairs and he has a hx of a lot of falls and pain in L heel is limiting safety and balance.  He reported that he is a [employer] and cannot have therapy at home due to this.  He uses a SPC at home for this reason too since there is not enough room for a walker.    Follow Up Recommendations  Outpatient OT    Equipment Recommendations       Recommendations for Other Services      Precautions / Restrictions Precautions Precautions: Fall Restrictions Weight Bearing Restrictions: No       Mobility Bed Mobility                  Transfers                      Balance                                   ADL Overall ADL's : Needs assistance/impaired   Eating/Feeding Details (indicate cue type and reason): Pt stated using foam utensil holder has helped with feeding and discussed weighted utensil to help with tremor but he has limited money for this but his sister who is coming to help him may be able to buy them for him.                                            Vision                     Perception     Praxis      Cognition   Behavior During Therapy: WFL for tasks assessed/performed Overall Cognitive Status: Within Functional Limits for tasks assessed                       Extremity/Trunk Assessment               Exercises     Shoulder Instructions       General Comments      Pertinent Vitals/ Pain       Pain Assessment: 0-10 Pain Score: 7  Pain Location: L heel Pain Descriptors / Indicators: Sharp Pain Intervention(s): Limited activity within patient's tolerance;Monitored during session;Premedicated before session (pt to have cortisone shots in heel today)  Home Living  Prior Functioning/Environment              Frequency Min 1X/week     Progress Toward Goals  OT Goals(current goals can now be found in the care plan section)  Progress towards OT goals: Progressing toward goals  Acute Rehab OT Goals Patient Stated Goal: Return to prior level of function at home OT Goal Formulation: With patient Time For Goal Achievement: 09/01/16 Potential to Achieve Goals: Good  Plan Discharge plan remains appropriate    Co-evaluation                 End of Session     Activity Tolerance Patient tolerated treatment well   Patient Left in bed;with call bell/phone within reach;with bed alarm set   Nurse Communication          Time: [id number] OT Time Calculation (min): 28 min  Charges: OT General Charges $OT Visit: 1 Procedure OT Treatments $Self Care/Home Management : 8-22 mins $Therapeutic Exercise: 8-22 mins  Chrys Racer, OTR/L ascom [phone] 09/03/16, 10:31 AM

## 2016-09-03 NOTE — Progress Notes (Signed)
Pt given d/c instructions r/t medications, activity, diet, when to call md, quiting smoking and stroke prevention and recognition.  Voiced understanding, pt d/c home via wheelchair escorted by auxillary and friend

## 2016-10-05 ENCOUNTER — Emergency Department: Payer: Medicaid Other

## 2016-10-05 ENCOUNTER — Emergency Department
Admission: EM | Admit: 2016-10-05 | Discharge: 2016-10-06 | Disposition: A | Discharge status: Home / Self Care | Payer: Medicaid Other | Attending: Emergency Medicine | Admitting: Emergency Medicine

## 2016-10-05 ENCOUNTER — Encounter: Payer: Self-pay | Admitting: Emergency Medicine

## 2016-10-05 DIAGNOSIS — R4782 Fluency disorder in conditions classified elsewhere: Secondary | ICD-10-CM | POA: Insufficient documentation

## 2016-10-05 DIAGNOSIS — Z7982 Long term (current) use of aspirin: Secondary | ICD-10-CM | POA: Diagnosis not present

## 2016-10-05 DIAGNOSIS — R45851 Suicidal ideations: Secondary | ICD-10-CM | POA: Diagnosis present

## 2016-10-05 DIAGNOSIS — F1290 Cannabis use, unspecified, uncomplicated: Secondary | ICD-10-CM | POA: Insufficient documentation

## 2016-10-05 DIAGNOSIS — Z791 Long term (current) use of non-steroidal anti-inflammatories (NSAID): Secondary | ICD-10-CM | POA: Insufficient documentation

## 2016-10-05 DIAGNOSIS — Z79899 Other long term (current) drug therapy: Secondary | ICD-10-CM | POA: Diagnosis not present

## 2016-10-05 DIAGNOSIS — F1994 Other psychoactive substance use, unspecified with psychoactive substance-induced mood disorder: Secondary | ICD-10-CM | POA: Diagnosis not present

## 2016-10-05 DIAGNOSIS — I2510 Atherosclerotic heart disease of native coronary artery without angina pectoris: Secondary | ICD-10-CM | POA: Insufficient documentation

## 2016-10-05 DIAGNOSIS — F17210 Nicotine dependence, cigarettes, uncomplicated: Secondary | ICD-10-CM | POA: Diagnosis not present

## 2016-10-05 DIAGNOSIS — Z859 Personal history of malignant neoplasm, unspecified: Secondary | ICD-10-CM | POA: Insufficient documentation

## 2016-10-05 DIAGNOSIS — F1020 Alcohol dependence, uncomplicated: Secondary | ICD-10-CM

## 2016-10-05 DIAGNOSIS — R4701 Aphasia: Secondary | ICD-10-CM | POA: Insufficient documentation

## 2016-10-05 DIAGNOSIS — I10 Essential (primary) hypertension: Secondary | ICD-10-CM | POA: Diagnosis not present

## 2016-10-05 DIAGNOSIS — F1910 Other psychoactive substance abuse, uncomplicated: Secondary | ICD-10-CM | POA: Insufficient documentation

## 2016-10-05 DIAGNOSIS — F1420 Cocaine dependence, uncomplicated: Secondary | ICD-10-CM

## 2016-10-05 DIAGNOSIS — F329 Major depressive disorder, single episode, unspecified: Secondary | ICD-10-CM

## 2016-10-05 DIAGNOSIS — F32A Depression, unspecified: Secondary | ICD-10-CM

## 2016-10-05 DIAGNOSIS — F1490 Cocaine use, unspecified, uncomplicated: Secondary | ICD-10-CM | POA: Insufficient documentation

## 2016-10-05 DIAGNOSIS — F8081 Childhood onset fluency disorder: Secondary | ICD-10-CM

## 2016-10-05 LAB — URINE DRUG SCREEN, QUALITATIVE (ARMC ONLY)
Amphetamines, Ur Screen: NOT DETECTED
BARBITURATES, UR SCREEN: NOT DETECTED
BENZODIAZEPINE, UR SCRN: NOT DETECTED
CANNABINOID 50 NG, UR ~~LOC~~: NOT DETECTED
COCAINE METABOLITE, UR ~~LOC~~: POSITIVE — AB
MDMA (Ecstasy)Ur Screen: NOT DETECTED
METHADONE SCREEN, URINE: NOT DETECTED
Opiate, Ur Screen: NOT DETECTED
Phencyclidine (PCP) Ur S: NOT DETECTED
TRICYCLIC, UR SCREEN: NOT DETECTED

## 2016-10-05 LAB — COMPREHENSIVE METABOLIC PANEL
ALK PHOS: 74 U/L (ref 38–126)
ALT: 72 U/L — AB (ref 17–63)
ANION GAP: 10 (ref 5–15)
AST: 62 U/L — ABNORMAL HIGH (ref 15–41)
Albumin: 4.2 g/dL (ref 3.5–5.0)
BUN: 11 mg/dL (ref 6–20)
CALCIUM: 9.3 mg/dL (ref 8.9–10.3)
CO2: 23 mmol/L (ref 22–32)
CREATININE: 0.86 mg/dL (ref 0.61–1.24)
Chloride: 107 mmol/L (ref 101–111)
Glucose, Bld: 105 mg/dL — ABNORMAL HIGH (ref 65–99)
Potassium: 4.7 mmol/L (ref 3.5–5.1)
SODIUM: 140 mmol/L (ref 135–145)
Total Bilirubin: 0.9 mg/dL (ref 0.3–1.2)
Total Protein: 7.6 g/dL (ref 6.5–8.1)

## 2016-10-05 LAB — CBC
HCT: 55 % — ABNORMAL HIGH (ref 40.0–52.0)
Hemoglobin: 18.5 g/dL — ABNORMAL HIGH (ref 13.0–18.0)
MCH: 30.1 pg (ref 26.0–34.0)
MCHC: 33.5 g/dL (ref 32.0–36.0)
MCV: 89.8 fL (ref 80.0–100.0)
PLATELETS: 174 10*3/uL (ref 150–440)
RBC: 6.13 MIL/uL — ABNORMAL HIGH (ref 4.40–5.90)
RDW: 13.3 % (ref 11.5–14.5)
WBC: 15.1 10*3/uL — ABNORMAL HIGH (ref 3.8–10.6)

## 2016-10-05 LAB — ETHANOL: ALCOHOL ETHYL (B): 172 mg/dL — AB (ref <5)

## 2016-10-05 LAB — ACETAMINOPHEN LEVEL

## 2016-10-05 LAB — TROPONIN I: Troponin I: <0.03 ng/mL (ref <0.03)

## 2016-10-05 LAB — SALICYLATE LEVEL

## 2016-10-05 MED ORDER — THIAMINE HCL 100 MG/ML IJ SOLN: 100.0000 mg | Freq: Every day | INTRAMUSCULAR | Status: DC

## 2016-10-05 MED ORDER — VITAMIN B-1 100 MG PO TABS
100.0000 mg | ORAL_TABLET | Freq: Every day | ORAL | Status: DC
  Administered 2016-10-06: 100 mg via ORAL
  Filled 2016-10-05: qty 1

## 2016-10-05 MED ORDER — LORAZEPAM 2 MG PO TABS
0.0000 mg | ORAL_TABLET | Freq: Four times a day (QID) | ORAL | Status: DC
  Administered 2016-10-05 – 2016-10-06 (×3): 2 mg via ORAL
  Filled 2016-10-05 (×3): qty 1

## 2016-10-05 MED ORDER — LORAZEPAM 2 MG PO TABS: 0.0000 mg | ORAL_TABLET | Freq: Two times a day (BID) | ORAL | Status: DC

## 2016-10-05 NOTE — ED Notes (Signed)
CT called for testing on pt, relayed need for time sensitive testing.

## 2016-10-05 NOTE — ED Notes (Signed)
Pt taken to bathroom to change into purple scrubs by Edison Nasuti, ED tech; unable to void at this time;

## 2016-10-05 NOTE — ED Triage Notes (Addendum)
Pt brought to ED by Karena Addison with suicidal thoughts; pt says he was going to walk out in front of a car earlier but didn't; pt says if he had the means to take his life he would; pt easily agitated; talking loud but denies harmful thoughts towards staff; here voluntarily; pt adds left sided chest pain but unable to state how long; pt says he is currently having tightness in his chest; pt with stuttered speech in triage;

## 2016-10-05 NOTE — ED Provider Notes (Signed)
[facility] Emergency Department Provider Note  Time seen: 10:26 PM  I have reviewed the triage vital signs and the nursing notes.   HISTORY  Chief Complaint Suicidal and Chest Pain    HPI Ian [name] is a 55 y.o. male with a past medical history of hypertension, CVA, alcohol abuse, drug abuse, who presents the emergency department with multiple complaints including suicidal ideation, admitted alcohol abuse, and difficulty communicating. According to the patient the difficulty speaking is the main concern which started earlier today. Per triage nurse the fascia is worsening even during the patient's brief time in the emergency department thus far. Patient has a history of CVA in the past. Patient admits alcohol use today. But states he drank a normal amount he does not typically have trouble speaking like this. Patient becomes agitated easily because he cannot get his words out properly. States he has fallen including today, but does not believe he hit his head.Patient does also state that he wants to kill himself, but does not have means to do so.  Past Medical History:  Diagnosis Date  . Anxiety   . Barrett esophagus   . Cancer ([place])   . Coronary artery disease   . Depression   . GERD (gastroesophageal reflux disease)   . Hypertension   . Stroke [facility]) 2014   "mini-stroke" per patient    Patient Active Problem List   Diagnosis Date Noted  . Acute left-sided weakness 05/03/2015  . CVA (cerebral infarction) 05/03/2015  . Weakness 11/16/2013  . Chest pain 11/16/2013  . HTN (hypertension) 11/16/2013  . Left-sided weakness 11/16/2013  . Torsades de pointes ([place]) 11/16/2013  . Hemiplegia, unspecified, affecting nondominant side 11/15/2013  . Speech and language deficits 11/15/2013    Past Surgical History:  Procedure Laterality Date  . ANGIOPLASTY    . APPENDECTOMY    . CARDIAC CATHETERIZATION    . CHOLECYSTECTOMY    . WRIST SURGERY Right    age  77    Prior to Admission medications   Medication Sig Start Date End Date Taking? Authorizing Provider  acetaminophen-codeine (TYLENOL #3) 300-30 MG tablet Take 1 tablet by mouth every 4 (four) hours as needed for pain.    Historical Provider, MD  aspirin EC 81 MG EC tablet Take 1 tablet (81 mg total) by mouth daily. Patient not taking: Reported on 09/01/2016 05/07/15   Fritzi Mandes, MD  atorvastatin (LIPITOR) 40 MG tablet Take 1 tablet (40 mg total) by mouth daily at 6 PM. 09/03/16 10/03/16  Dustin Flock, MD  citalopram (CELEXA) 20 MG tablet Take 20 mg by mouth at bedtime. 04/12/15   Historical Provider, MD  clonazePAM (KLONOPIN) 0.5 MG tablet Take 0.5 mg by mouth 2 (two) times daily as needed for anxiety.     Historical Provider, MD  gabapentin (NEURONTIN) 100 MG capsule Take 1 capsule (100 mg total) by mouth 3 (three) times daily. 08/13/15 08/12/16  Nance Pear, MD  hydrOXYzine (ATARAX/VISTARIL) 50 MG tablet Take 1 tablet by mouth 2 (two) times daily as needed. 03/01/15   Historical Provider, MD  lisinopril (PRINIVIL,ZESTRIL) 10 MG tablet Take 10 mg by mouth daily. 03/01/15   Historical Provider, MD  ondansetron (ZOFRAN) 4 MG tablet Take 1 tablet (4 mg total) by mouth daily as needed for nausea or vomiting. Patient not taking: Reported on 09/01/2016 07/09/15   Ahmed Prima, MD  oxyCODONE-acetaminophen (ROXICET) 5-325 MG per tablet Take 1 tablet by mouth every 6 (six) hours as needed.  Patient not taking: Reported on 09/01/2016 07/09/15   Ahmed Prima, MD  pantoprazole (PROTONIX) 40 MG tablet Take 40 mg by mouth daily.    Historical Provider, MD  PROAIR HFA 108 (90 BASE) MCG/ACT inhaler Inhale 1-2 puffs into the lungs every 6 (six) hours as needed for wheezing or shortness of breath.     Historical Provider, MD  QUEtiapine (SEROQUEL) 300 MG tablet Take 1 tablet (300 mg total) by mouth at bedtime. Patient taking differently: Take 400 mg by mouth at bedtime.  11/17/13   Nita Sells, MD   QUEtiapine (SEROQUEL) 50 MG tablet Take 1 tablet (50 mg total) by mouth daily at 12 noon. 11/17/13   Nita Sells, MD  thiamine 50 MG tablet Take 1 tablet (50 mg total) by mouth daily. Patient not taking: Reported on 09/01/2016 05/07/15   Fritzi Mandes, MD    Allergies  Allergen Reactions  . Fluoxetine Swelling    Anxiety, itching  . Hydrocodone-Acetaminophen Itching    Family History  Problem Relation Age of Onset  . Stroke Father   . Leukemia Father   . Breast cancer Sister     Social History Social History  Substance Use Topics  . Smoking status: Current Every Day Smoker    Packs/day: 1.00    Types: Cigarettes  . Smokeless tobacco: Never Used  . Alcohol use Yes     Comment: denies being a daily drinker but did have alcohol today    Review of Systems Constitutional: Negative for fever. Cardiovascular: Negative for chest pain. Respiratory: Negative for shortness of breath. Gastrointestinal: Negative for abdominal pain Musculoskeletal: Negative for back pain. Neurological: Negative for headache 10-point ROS otherwise negative.  ____________________________________________   PHYSICAL EXAM:  VITAL SIGNS: ED Triage Vitals  Enc Vitals Group     BP 10/05/16 2123 (!) 164/114     Pulse Rate 10/05/16 2123 (!) 101     Resp 10/05/16 2123 (!) 24     Temp 10/05/16 2123 98.6 F (37 C)     Temp Source 10/05/16 2123 Oral     SpO2 10/05/16 2123 95 %     Weight 10/05/16 2123 251 lb (113.9 kg)     Height 10/05/16 2123 6' (1.829 m)     Head Circumference --      Peak Flow --      Pain Score 10/05/16 2124 6     Pain Loc --      Pain Edu? --      Excl. in [place]? --     Constitutional: Alert, oriented but having extreme difficulty speaking. Eyes: Normal exam, PERRL ENT   Head: Normocephalic and atraumatic.   Mouth/Throat: Mucous membranes are moist. Cardiovascular: Normal rate, regular rhythm.  Respiratory: Normal respiratory effort without tachypnea nor  retractions. Breath sounds are clear  Gastrointestinal: Soft and nontender. No distention. Musculoskeletal: Nontender with normal range of motion in all extremities Neurologic:  Patient is quite tremulous, appears to have equal grip strengths bilaterally. Patient is having significant aphasia. Skin:  Skin is warm, dry and intact.  Psychiatric: Mood and affect are normal.   ____________________________________________    EKG  EKG reviewed and interpreted on a social sinus rhythm at 89 bpm, narrow QRS, normal axis, normal intervals, no concerning ST changes.  ____________________________________________    RADIOLOGY  CT head shows no acute abnormality  ____________________________________________   INITIAL IMPRESSION / ASSESSMENT AND PLAN / ED COURSE  Pertinent labs & imaging results that were available during my care of  the patient were reviewed by me and considered in my medical decision making (see chart for details).  Patient presents the emergency department with multiple complaints. The major issues the patient appears to be suffering from a broken as aphasia, difficulty expressing words, with agitation from being unable to express his words. Patient does admit a daily alcohol use including today. To complicate the matter he states active suicidal ideation. Patient is on-call levels 172, patient is a chronic alcoholic, do not believe the patient's alcohol level of 172 would be sufficient to explain today's symptoms.  To even further complicate the matter the patient is becoming agitated, ripping his leads off of his chest, attempting to grab staff, and may require IM sedation. We were able to obtain a head CT, and currently awaiting head CT results to ensure that there is no bleed.   Workup is largely within normal limits/baseline. Alcohol is slightly elevated 172. CT shows no acute findings. We will have telling neurology see the patient. Patient care signed out to Dr.  Beather Arbour. ____________________________________________   FINAL CLINICAL IMPRESSION(S) / ED DIAGNOSES  Suicidal ideation Aphasia    Harvest Dark, MD 10/09/16 2124

## 2016-10-06 DIAGNOSIS — F1020 Alcohol dependence, uncomplicated: Secondary | ICD-10-CM

## 2016-10-06 DIAGNOSIS — F1994 Other psychoactive substance use, unspecified with psychoactive substance-induced mood disorder: Secondary | ICD-10-CM

## 2016-10-06 DIAGNOSIS — F32A Depression, unspecified: Secondary | ICD-10-CM

## 2016-10-06 DIAGNOSIS — F329 Major depressive disorder, single episode, unspecified: Secondary | ICD-10-CM

## 2016-10-06 DIAGNOSIS — F1420 Cocaine dependence, uncomplicated: Secondary | ICD-10-CM

## 2016-10-06 MED ORDER — VITAMIN B-1 100 MG PO TABS
250.0000 mg | ORAL_TABLET | Freq: Once | ORAL | Status: AC
  Administered 2016-10-06: 250 mg via ORAL
  Filled 2016-10-06: qty 2.5

## 2016-10-06 NOTE — ED Notes (Signed)
Pt given supper tray.

## 2016-10-06 NOTE — ED Notes (Signed)
Notified pharmacy of need for thiamine 250mg 

## 2016-10-06 NOTE — ED Notes (Signed)
Returned pt belongings and he verified that all items were present and accounted for

## 2016-10-06 NOTE — ED Provider Notes (Signed)
-----------------------------------------   5:04 PM on 10/06/2016 -----------------------------------------  Patient has been observed in the ER for 19 hours. He currently denies any chest pain, shortness of breath headache or discomfort. He remains afebrile and hemodynamic stable. He did have a few brief episodes of low saturations but these were obtained while the patient was sleeping. While awake he has no desaturations. Laboratory evaluation is remarkable for leukocytosis but the patient does not demonstrate any signs of acute infection. Possible secondary to cocaine abuse and alcohol abuse. Patient has been evaluated at bedside by psychiatry. Patient with brief suicidal ideation likely secondary to polysubstance abuse. Patient currently denies any HI or SI. He has no hallucinations. His abdominal exam is soft and benign. Patient offered referral for detox. At this point do not feel the patient requires inpatient admission as he remains dynamically stable, is able to and related with a steady gait, has no other complaints, is able to tolerate oral hydration.  Patient was able to tolerate PO and was able to ambulate with a steady gait.  Have discussed with the patient and available family all diagnostics and treatments performed thus far and all questions were answered to the best of my ability. The patient demonstrates understanding and agreement with plan.    Merlyn Lot, MD 10/06/16 [phone]

## 2016-10-06 NOTE — ED Notes (Signed)
Lunch tray given. 

## 2016-10-06 NOTE — BH Assessment (Signed)
Assessment Note  Ian [name] is an 56 y.o. male. Ian [name] arrived to the ED by way of [place] police.  He reports that "I just had enough" He states "I am about to lose everything I got".  He shared that his anxiety got the best of him.  He states that he is about to get his home condemned. He is overwhelmed and is about to start treatment for cancer.  He reports symptoms of depression and worrying excessively.  He states he "just has a lot coming on".  He denied having auditory or visual hallucinations. He reports suicidal ideation. He denied homicidal ideation or intent. He reports the use of alcohol, and reports the use of cocaine "here and there", "when I am around the wrong people".  He states that he sees an increase in his anxiety. He states that he is not good with change.   Diagnosis: Depression, anxiety  Past Medical History:  Past Medical History:  Diagnosis Date  . Anxiety   . Barrett esophagus   . Cancer ([place])   . Coronary artery disease   . Depression   . GERD (gastroesophageal reflux disease)   . Hypertension   . Stroke [facility]) 2014   "mini-stroke" per patient    Past Surgical History:  Procedure Laterality Date  . ANGIOPLASTY    . APPENDECTOMY    . CARDIAC CATHETERIZATION    . CHOLECYSTECTOMY    . WRIST SURGERY Right    age 52    Family History:  Family History  Problem Relation Age of Onset  . Stroke Father   . Leukemia Father   . Breast cancer Sister     Social History:  reports that he has been smoking Cigarettes.  He has been smoking about 1.00 pack per day. He has never used smokeless tobacco. He reports that he drinks alcohol. He reports that he uses drugs, including Cocaine and Marijuana.  Additional Social History:  Alcohol / Drug Use History of alcohol / drug use?: Yes Substance #1 Name of Substance 1: Alcohol 1 - Age of First Use: 12 1 - Amount (size/oz): "A bunch" - just beer 1 - Frequency: 2 times a week on average 1 - Last Use / Amount:  10/05/2016 Substance #2 Name of Substance 2: Cocaine 2 - Age of First Use: 15 2 - Amount (size/oz): varied 2 - Frequency: not often 2 - Last Use / Amount: 10/05/2016  CIWA: CIWA-Ar BP: (!) 148/110 Pulse Rate: 90 COWS:    Allergies:  Allergies  Allergen Reactions  . Fluoxetine Swelling    Anxiety, itching  . Hydrocodone-Acetaminophen Itching    Home Medications:  (Not in a hospital admission)  OB/GYN Status:  No LMP for male patient.  General Assessment Data Location of Assessment: [facility] ED TTS Assessment: In system Is this a Tele or Face-to-Face Assessment?: Face-to-Face Is this an Initial Assessment or a Re-assessment for this encounter?: Initial Assessment Marital status: Divorced [place] name: n/a Is patient pregnant?: No Pregnancy Status: No Living Arrangements: Alone Can pt return to current living arrangement?: Yes Admission Status: Voluntary Is patient capable of signing voluntary admission?: Yes Referral Source: Self/Family/Friend Insurance type: Medicaid  Medical Screening Exam ([place]) Medical Exam completed: Yes  Crisis Care Plan Living Arrangements: Alone Legal Guardian: Other: (Self) Name of Psychiatrist: [facility] Name of Therapist: [facility]  Education Status Is patient currently in school?: No Current Grade: n/a Highest grade of school patient has completed: 10th Name of  school: [name] person: n/a  Risk to self with the past 6 months Suicidal Ideation: Yes-Currently Present Has patient been a risk to self within the past 6 months prior to admission? : Yes Suicidal Intent: No-Not Currently/Within Last 6 Months Has patient had any suicidal intent within the past 6 months prior to admission? : Yes Is patient at risk for suicide?: No (Currently in the hospital) Suicidal Plan?: No Has patient had any suicidal plan within the past 6 months prior to admission? : No Access to Means: No Specify  Access to Suicidal Means: denied he had a plan where he could hurt himself and no one else What has been your use of drugs/alcohol within the last 12 months?: use of alcohol and cocaine Previous Attempts/Gestures: Yes How many times?: 1 Other Self Harm Risks: denied Triggers for Past Attempts: Unknown Intentional Self Injurious Behavior: None Family Suicide History: Yes (Uncle) Recent stressful life event(s): Financial Problems (house being condemed) Persecutory voices/beliefs?: No Depression: Yes Depression Symptoms: Despondent, Guilt, Loss of interest in usual pleasures, Feeling worthless/self pity Substance abuse history and/or treatment for substance abuse?: Yes Suicide prevention information given to non-admitted patients: Not applicable  Risk to Others within the past 6 months Homicidal Ideation: No Does patient have any lifetime risk of violence toward others beyond the six months prior to admission? : No Thoughts of Harm to Others: No Current Homicidal Intent: No Current Homicidal Plan: No Access to Homicidal Means: No Identified Victim: none identified History of harm to others?: No Assessment of Violence: None Noted Violent Behavior Description: denied Does patient have access to weapons?: No Criminal Charges Pending?: No Does patient have a court date: No Is patient on probation?: No  Psychosis Hallucinations: None noted Delusions: None noted  Mental Status Report Appearance/Hygiene: In scrubs, Body odor Eye Contact: Fair Motor Activity: Unremarkable Speech: Logical/coherent (stutter) Level of Consciousness: Alert Mood: Depressed Affect: Flat Anxiety Level: Minimal Thought Processes: Coherent Judgement: Unimpaired Orientation: Person, Place, Time, Appropriate for developmental age, Situation Obsessive Compulsive Thoughts/Behaviors: None  Cognitive Functioning Concentration: Normal Memory: Recent Intact IQ: Average Insight: Fair Impulse Control:  Fair Appetite: Fair Sleep: Decreased Vegetative Symptoms: None  ADLScreening [facility] Assessment Services) Patient's cognitive ability adequate to safely complete daily activities?: Yes Patient able to express need for assistance with ADLs?: Yes Independently performs ADLs?: Yes (appropriate for developmental age)  Prior Inpatient Therapy Prior Inpatient Therapy: Yes Prior Therapy Dates: 2016 Prior Therapy Facilty/Provider(s): [facility], [facility],  Reason for Treatment: Depression  Prior Outpatient Therapy Prior Outpatient Therapy: Yes Prior Therapy Dates: Currently Prior Therapy Facilty/Provider(s): [facility] Reason for Treatment: Depression Does patient have an ACCT team?: No Does patient have Intensive In-House Services?  : No Does patient have [facility] services? : No Does patient have P4CC services?: No  ADL Screening (condition at time of admission) Patient's cognitive ability adequate to safely complete daily activities?: Yes Patient able to express need for assistance with ADLs?: Yes Independently performs ADLs?: Yes (appropriate for developmental age)       Abuse/Neglect Assessment (Assessment to be complete while patient is alone) Physical Abuse: Denies Verbal Abuse: Denies Sexual Abuse: Denies Exploitation of patient/patient's resources: Denies Self-Neglect: Denies          Additional Information 1:1 In Past 12 Months?: No CIRT Risk: No Elopement Risk: No Does patient have medical clearance?: Yes     Disposition:  Disposition Initial Assessment Completed for this Encounter: Yes Disposition of Patient: Other dispositions  On Site Evaluation by:  Reviewed with Physician:    Elmer Bales 10/06/2016 1:29 AM

## 2016-10-06 NOTE — Consult Note (Signed)
[facility] Consult   Reason for Consult:  Consult for 55 year old man with a history of alcohol abuse and chronic depression who came to the emergency room requesting help Referring Physician:  Quentin Cornwall Patient Identification: Ian [name] MRN:  [id number] Principal Diagnosis: Substance induced mood disorder [facility]) Diagnosis:   Patient Active Problem List   Diagnosis Date Noted  . Alcohol abuse [F10.10] 10/06/2016  . Substance induced mood disorder ([place]) [F19.94] 10/06/2016  . Cocaine abuse [F14.10] 10/06/2016  . Depression [F32.9] 10/06/2016  . Acute left-sided weakness [M62.89] 05/03/2015  . CVA (cerebral infarction) [I63.9] 05/03/2015  . Weakness [R53.1] 11/16/2013  . Chest pain [R07.9] 11/16/2013  . HTN (hypertension) [I10] 11/16/2013  . Left-sided weakness [R53.1] 11/16/2013  . Torsades de pointes ([place]) [I47.2] 11/16/2013  . Hemiplegia, unspecified, affecting nondominant side [G81.90] 11/15/2013  . Speech and language deficits [F80.9, R47.9] 11/15/2013    Total Time spent with patient: 1 hour  Subjective:   Ian [name] is a 55 y.o. male patient admitted with "I've had a rough month".  HPI:  Patient says he's had a lot of stress recently. His trailer has no power and no water service and is about to be condemned. He has been drinking heavily at least several days a week and using cocaine intermittently. He does say he is still compliant with his prescribed psychiatric medicine. His mood is been feeling down and anxious. Sometimes he sleeps restlessly. He denies any psychotic symptoms. He denies any wish or plan to kill himself and denies any homicidal ideation. Patient says you felt like he was at his wits end and just had to get away from home briefly.  Medical history: History of hypertension and stroke and heart arrhythmia.  Substance abuse history: Long-standing history of alcohol abuse as well as intermittent abuse of cocaine. No history of seizures or  delirium tremens.  Social history: Lives by himself. Says he can't stand being around other people very much.  Past Psychiatric History: Patient goes to [facility] for outpatient treatment and is compliant with Celexa Seroquel and Klonopin. He's had inpatient treatment at the [facility] drug abuse treatment [facility] in [place] which she hated. He's had inpatient treatment in the past as well. Denies ever seriously trying to kill himself.  Risk to Self: Suicidal Ideation: Yes-Currently Present Suicidal Intent: No-Not Currently/Within Last 6 Months Is patient at risk for suicide?: No (Currently in the hospital) Suicidal Plan?: No Access to Means: No Specify Access to Suicidal Means: denied he had a plan where he could hurt himself and no one else What has been your use of drugs/alcohol within the last 12 months?: use of alcohol and cocaine How many times?: 1 Other Self Harm Risks: denied Triggers for Past Attempts: Unknown Intentional Self Injurious Behavior: None Risk to Others: Homicidal Ideation: No Thoughts of Harm to Others: No Current Homicidal Intent: No Current Homicidal Plan: No Access to Homicidal Means: No Identified Victim: none identified History of harm to others?: No Assessment of Violence: None Noted Violent Behavior Description: denied Does patient have access to weapons?: No Criminal Charges Pending?: No Does patient have a court date: No Prior Inpatient Therapy: Prior Inpatient Therapy: Yes Prior Therapy Dates: 2016 Prior Therapy Facilty/Provider(s): [facility], [facility],  Reason for Treatment: Depression Prior Outpatient Therapy: Prior Outpatient Therapy: Yes Prior Therapy Dates: Currently Prior Therapy Facilty/Provider(s): [facility] Reason for Treatment: Depression Does patient have an ACCT team?: No Does patient have Intensive In-House Services?  : No Does patient have  [facility] services? : No Does patient have P4CC services?: No  Past Medical History:   Past Medical History:  Diagnosis Date  . Anxiety   . Barrett esophagus   . Cancer ([place])   . Coronary artery disease   . Depression   . GERD (gastroesophageal reflux disease)   . Hypertension   . Stroke [facility]) 2014   "mini-stroke" per patient    Past Surgical History:  Procedure Laterality Date  . ANGIOPLASTY    . APPENDECTOMY    . CARDIAC CATHETERIZATION    . CHOLECYSTECTOMY    . WRIST SURGERY Right    age 31   Family History:  Family History  Problem Relation Age of Onset  . Stroke Father   . Leukemia Father   . Breast cancer Sister    Family Psychiatric  History: Family history positive for mood disorder Social History:  History  Alcohol Use  . Yes    Comment: denies being a daily drinker but did have alcohol today     History  Drug Use  . Types: Cocaine, Marijuana    Comment: used cocaine today; denies smoking marijuana for years    Social History   Social History  . Marital status: Single    Spouse name: N/A  . Number of children: N/A  . Years of education: N/A   Social History Main Topics  . Smoking status: Current Every Day [name]    Packs/day: 1.00    Types: Cigarettes  . Smokeless tobacco: Never Used  . Alcohol use Yes     Comment: denies being a daily drinker but did have alcohol today  . Drug use:     Types: Cocaine, Marijuana     Comment: used cocaine today; denies smoking marijuana for years  . Sexual activity: Not Asked   Other Topics Concern  . None   Social History Narrative  . None   Additional Social History:    Allergies:   Allergies  Allergen Reactions  . Fluoxetine Swelling    Anxiety, itching  . Hydrocodone-Acetaminophen Itching    Labs:  Results for orders placed or performed during the hospital encounter of 10/05/16 (from the past 48 hour(s))  Comprehensive metabolic panel     Status: Abnormal   Collection Time: 10/05/16  9:38 PM  Result Value Ref Range   Sodium 140 135 - 145 mmol/L   Potassium 4.7 3.5 - 5.1  mmol/L    Comment: HEMOLYSIS AT THIS LEVEL MAY AFFECT RESULT   Chloride 107 101 - 111 mmol/L   CO2 23 22 - 32 mmol/L   Glucose, Bld 105 (H) 65 - 99 mg/dL   BUN 11 6 - 20 mg/dL   Creatinine, Ser 0.86 0.61 - 1.24 mg/dL   Calcium 9.3 8.9 - 10.3 mg/dL   Total Protein 7.6 6.5 - 8.1 g/dL   Albumin 4.2 3.5 - 5.0 g/dL   AST 62 (H) 15 - 41 U/L   ALT 72 (H) 17 - 63 U/L   Alkaline Phosphatase 74 38 - 126 U/L   Total Bilirubin 0.9 0.3 - 1.2 mg/dL   GFR calc non Af Amer >60 >60 mL/min   GFR calc Af Amer >60 >60 mL/min    Comment: (NOTE) The eGFR has been calculated using the CKD EPI equation. This calculation has not been validated in all clinical situations. eGFR's persistently <60 mL/min signify possible Chronic Kidney Disease.    Anion gap 10 5 - 15  Ethanol     Status:  Abnormal   Collection Time: 10/05/16  9:38 PM  Result Value Ref Range   Alcohol, Ethyl (B) 172 (H) <5 mg/dL    Comment:        LOWEST DETECTABLE LIMIT FOR SERUM ALCOHOL IS 5 mg/dL FOR MEDICAL PURPOSES ONLY   Salicylate level     Status: None   Collection Time: 10/05/16  9:38 PM  Result Value Ref Range   Salicylate Lvl <1.9 2.8 - 30.0 mg/dL  Acetaminophen level     Status: Abnormal   Collection Time: 10/05/16  9:38 PM  Result Value Ref Range   Acetaminophen (Tylenol), Serum <10 (L) 10 - 30 ug/mL    Comment:        THERAPEUTIC CONCENTRATIONS VARY SIGNIFICANTLY. A RANGE OF 10-30 ug/mL MAY BE AN EFFECTIVE CONCENTRATION FOR MANY PATIENTS. HOWEVER, SOME ARE BEST TREATED AT CONCENTRATIONS OUTSIDE THIS RANGE. ACETAMINOPHEN CONCENTRATIONS >150 ug/mL AT 4 HOURS AFTER INGESTION AND >50 ug/mL AT 12 HOURS AFTER INGESTION ARE OFTEN ASSOCIATED WITH TOXIC REACTIONS.   cbc     Status: Abnormal   Collection Time: 10/05/16  9:38 PM  Result Value Ref Range   WBC 15.1 (H) 3.8 - 10.6 K/uL   RBC 6.13 (H) 4.40 - 5.90 MIL/uL   Hemoglobin 18.5 (H) 13.0 - 18.0 g/dL   HCT 55.0 (H) 40.0 - 52.0 %   MCV 89.8 80.0 - 100.0 fL    MCH 30.1 26.0 - 34.0 pg   MCHC 33.5 32.0 - 36.0 g/dL   RDW 13.3 11.5 - 14.5 %   Platelets 174 150 - 440 K/uL    Comment: COUNT MAY BE INACCURATE DUE TO FIBRIN CLUMPS.  Urine Drug Screen, Qualitative     Status: Abnormal   Collection Time: 10/05/16  9:38 PM  Result Value Ref Range   Tricyclic, Ur Screen NONE DETECTED NONE DETECTED   Amphetamines, Ur Screen NONE DETECTED NONE DETECTED   MDMA (Ecstasy)Ur Screen NONE DETECTED NONE DETECTED   Cocaine Metabolite,Ur [place] POSITIVE (A) NONE DETECTED   Opiate, Ur Screen NONE DETECTED NONE DETECTED   Phencyclidine (PCP) Ur S NONE DETECTED NONE DETECTED   Cannabinoid 50 Ng, Ur [place] NONE DETECTED NONE DETECTED   Barbiturates, Ur Screen NONE DETECTED NONE DETECTED   Benzodiazepine, Ur Scrn NONE DETECTED NONE DETECTED   Methadone Scn, Ur NONE DETECTED NONE DETECTED    Comment: (NOTE) 379  Tricyclics, urine               Cutoff 1000 ng/mL 200  Amphetamines, urine             Cutoff 1000 ng/mL 300  MDMA (Ecstasy), urine           Cutoff 500 ng/mL 400  Cocaine Metabolite, urine       Cutoff 300 ng/mL 500  Opiate, urine                   Cutoff 300 ng/mL 600  Phencyclidine (PCP), urine      Cutoff 25 ng/mL 700  Cannabinoid, urine              Cutoff 50 ng/mL 800  Barbiturates, urine             Cutoff 200 ng/mL 900  Benzodiazepine, urine           Cutoff 200 ng/mL 1000 Methadone, urine                Cutoff 300 ng/mL 1100 1200 The urine drug screen provides  only a preliminary, unconfirmed 1300 analytical test result and should not be used for non-medical 1400 purposes. Clinical consideration and professional judgment should 1500 be applied to any positive drug screen result due to possible 1600 interfering substances. A more specific alternate chemical method 1700 must be used in order to obtain a confirmed analytical result.  1800 Gas chromato graphy / mass spectrometry (GC/MS) is the preferred 1900 confirmatory method.   Troponin I     Status:  None   Collection Time: 10/05/16  9:38 PM  Result Value Ref Range   Troponin I <0.03 <0.03 ng/mL    Current Facility-Administered Medications  Medication Dose Route Frequency Provider Last Rate Last Dose  . LORazepam (ATIVAN) tablet 0-4 mg  0-4 mg Oral Q6H Harvest Dark, MD   2 mg at 10/06/16 1527   Followed by  . [START ON 10/08/2016] LORazepam (ATIVAN) tablet 0-4 mg  0-4 mg Oral Q12H Harvest Dark, MD      . thiamine (VITAMIN B-1) tablet 100 mg  100 mg Oral Daily Harvest Dark, MD   100 mg at 10/06/16 1143   Or  . thiamine (B-1) injection 100 mg  100 mg Intravenous Daily Harvest Dark, MD      . thiamine (VITAMIN B-1) tablet 250 mg  250 mg Oral Once Merlyn Lot, MD       Current Outpatient Prescriptions  Medication Sig Dispense Refill  . acetaminophen-codeine (TYLENOL #3) 300-30 MG tablet Take 1 tablet by mouth every 4 (four) hours as needed for pain.    Marland Kitchen aspirin EC 81 MG EC tablet Take 1 tablet (81 mg total) by mouth daily. (Patient not taking: Reported on 09/01/2016) 30 tablet 0  . atorvastatin (LIPITOR) 40 MG tablet Take 1 tablet (40 mg total) by mouth daily at 6 PM. 30 tablet 0  . citalopram (CELEXA) 20 MG tablet Take 20 mg by mouth at bedtime.  0  . clonazePAM (KLONOPIN) 0.5 MG tablet Take 0.5 mg by mouth 2 (two) times daily as needed for anxiety.     . gabapentin (NEURONTIN) 100 MG capsule Take 1 capsule (100 mg total) by mouth 3 (three) times daily. 90 capsule 2  . hydrOXYzine (ATARAX/VISTARIL) 50 MG tablet Take 1 tablet by mouth 2 (two) times daily as needed.  0  . lisinopril (PRINIVIL,ZESTRIL) 10 MG tablet Take 10 mg by mouth daily.  0  . ondansetron (ZOFRAN) 4 MG tablet Take 1 tablet (4 mg total) by mouth daily as needed for nausea or vomiting. (Patient not taking: Reported on 09/01/2016) 10 tablet 0  . oxyCODONE-acetaminophen (ROXICET) 5-325 MG per tablet Take 1 tablet by mouth every 6 (six) hours as needed. (Patient not taking: Reported on 09/01/2016) 12  tablet 0  . pantoprazole (PROTONIX) 40 MG tablet Take 40 mg by mouth daily.    Marland Kitchen PROAIR HFA 108 (90 BASE) MCG/ACT inhaler Inhale 1-2 puffs into the lungs every 6 (six) hours as needed for wheezing or shortness of breath.   0  . QUEtiapine (SEROQUEL) 300 MG tablet Take 1 tablet (300 mg total) by mouth at bedtime. (Patient taking differently: Take 400 mg by mouth at bedtime. ) 30 tablet 0  . QUEtiapine (SEROQUEL) 50 MG tablet Take 1 tablet (50 mg total) by mouth daily at 12 noon. 30 tablet 0  . thiamine 50 MG tablet Take 1 tablet (50 mg total) by mouth daily. (Patient not taking: Reported on 09/01/2016) 30 tablet 0    Musculoskeletal: Strength & Muscle Tone: decreased Gait &  Station: ataxic Patient leans: N/A  Psychiatric Specialty Exam: Physical Exam  Nursing note and vitals reviewed. Constitutional: He appears well-developed and well-nourished.  HENT:  Head: Normocephalic and atraumatic.  Eyes: Conjunctivae are normal. Pupils are equal, round, and reactive to light.  Neck: Normal range of motion.  Cardiovascular: Regular rhythm and normal heart sounds.   Respiratory: Effort normal. No respiratory distress.  GI: Soft.  Musculoskeletal: Normal range of motion.  Neurological: He is alert.  Skin: Skin is warm and dry.  Psychiatric: His speech is normal. His mood appears anxious. He is slowed. Cognition and memory are impaired. He expresses impulsivity. He expresses no homicidal and no suicidal ideation.    Review of Systems  HENT: Negative.   Eyes: Negative.   Respiratory: Negative.   Cardiovascular: Negative.   Gastrointestinal: Negative.   Musculoskeletal: Negative.   Skin: Negative.   Neurological: Positive for tremors and weakness.  Psychiatric/Behavioral: Positive for depression and substance abuse. Negative for hallucinations, memory loss and suicidal ideas. The patient is nervous/anxious. The patient does not have insomnia.     Blood pressure (!) 149/101, pulse 72,  temperature 98.5 F (36.9 C), temperature source Oral, resp. rate 20, height 6' (1.829 m), weight 113.9 kg (251 lb), SpO2 96 %.Body mass index is 34.04 kg/m.  General Appearance: Disheveled  Eye Contact:  Fair  Speech:  Clear and Coherent  Volume:  Decreased  Mood:  Anxious and Depressed  Affect:  Appropriate  Thought Process:  Goal Directed  Orientation:  Full (Time, Place, and Person)  Thought Content:  Logical  Suicidal Thoughts:  No  Homicidal Thoughts:  No  Memory:  Immediate;   Good Recent;   Fair Remote;   Fair  Judgement:  Fair  Insight:  Fair  Psychomotor Activity:  Decreased  Concentration:  Concentration: Fair  Recall:  [employer] of Knowledge:  Fair  Language:  Fair  Akathisia:  No  Handed:  Right  AIMS (if indicated):     Assets:  Communication Skills Desire for Improvement Housing  ADL's:  Intact  Cognition:  WNL  Sleep:        Treatment Plan Summary: Plan This is a 55 year old man with alcohol abuse and chronic mood symptoms. Came in intoxicated and jittery. Patient denies any active suicidal intent or plan. No homicidal ideation and no evidence of psychosis. No history of seizures or delirium tremens although he is jittery and still on a heart monitor. Patient does not require inpatient psychiatric treatment. He has appropriate outpatient treatment in the community. Reviewed treatment plan with patient. Reviewed with emergency room physician. Discontinue the involuntary commitment. He can be released psychiatrically and will follow-up with his usual outpatient provider.  Disposition: No evidence of imminent risk to self or others at present.   Patient does not meet criteria for psychiatric inpatient admission. Supportive therapy provided about ongoing stressors.  Alethia Berthold, MD 10/06/2016 5:12 PM

## 2016-10-06 NOTE — ED Provider Notes (Signed)
-----------------------------------------   12:41 AM on 10/06/2016 -----------------------------------------  Patient was evaluated by [facility] neurologist Dr. Clovia Cuff. Feels patient is stuttering which is not indicative of CVA. Given that patient is intoxicated and also under IVC, will continue to observe patient in the emergency department overnight.  ----------------------------------------- 6:57 AM on 10/06/2016 -----------------------------------------   Blood pressure 108/72, pulse 89, temperature 98.5 F (36.9 C), temperature source Oral, resp. rate 18, height 6' (1.829 m), weight 251 lb (113.9 kg), SpO2 96 %.  No further events since last update.  He was given an Ativan per his request approximately 90 minutes ago. Calm and cooperative at this time.  Disposition is pending Psychiatry/Behavioral Medicine team recommendations.     Paulette Blanch, MD 10/06/16 [phone]

## 2016-12-17 IMAGING — US US CAROTID DUPLEX BILAT
1 series · 13 of 24 positions shown · non-contrast
Comparison: 05/03/2015

CLINICAL DATA: 55-year-old male with a history of cerebral vascular
accident.

Cardiovascular risk factors include hypertension, known prior
stroke/ TIA, tobacco use, known vascular disease.
EXAM:
BILATERAL CAROTID DUPLEX ULTRASOUND
TECHNIQUE: Gray scale imaging, color Doppler and duplex ultrasound were
performed of bilateral carotid and vertebral arteries in the neck.

[Series 1: us carotid duplex bilat · 13 of 65 slices shown]
[im 1/65]
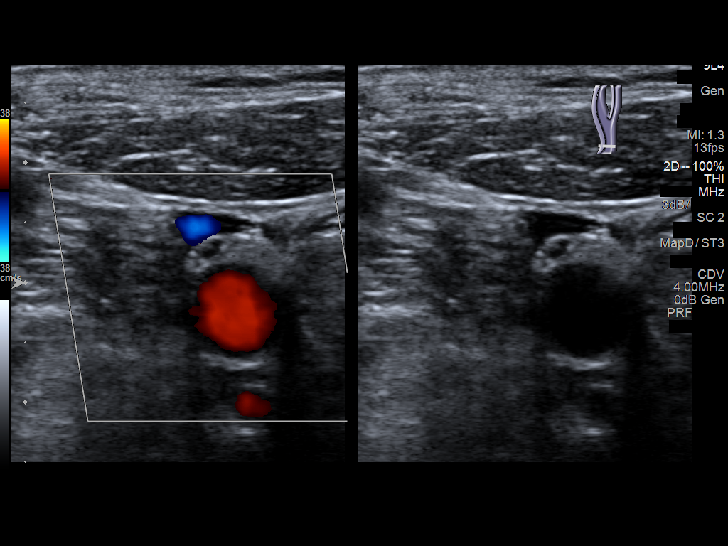
[im 6/65]
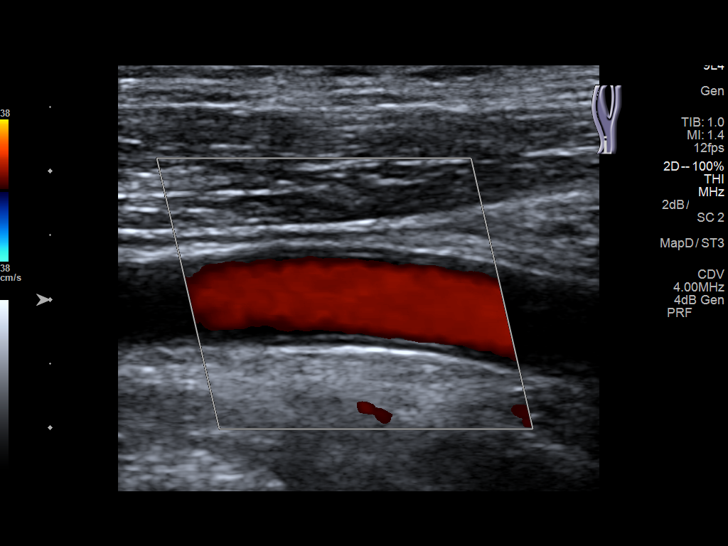
[im 12/65]
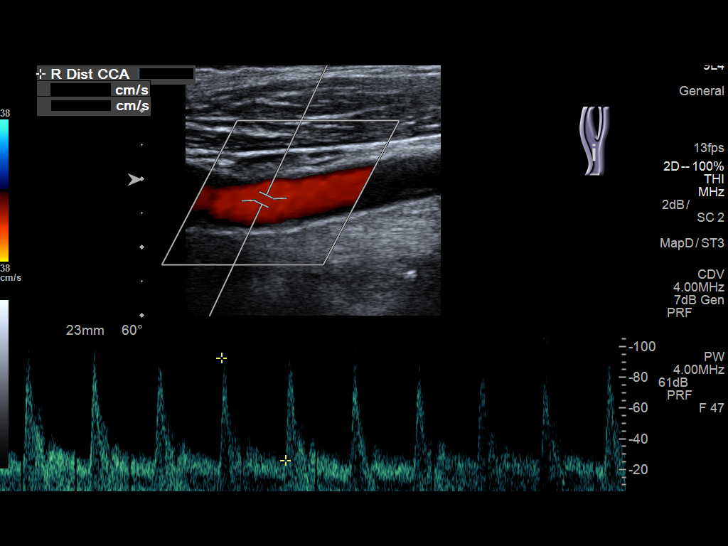
[im 17/65]
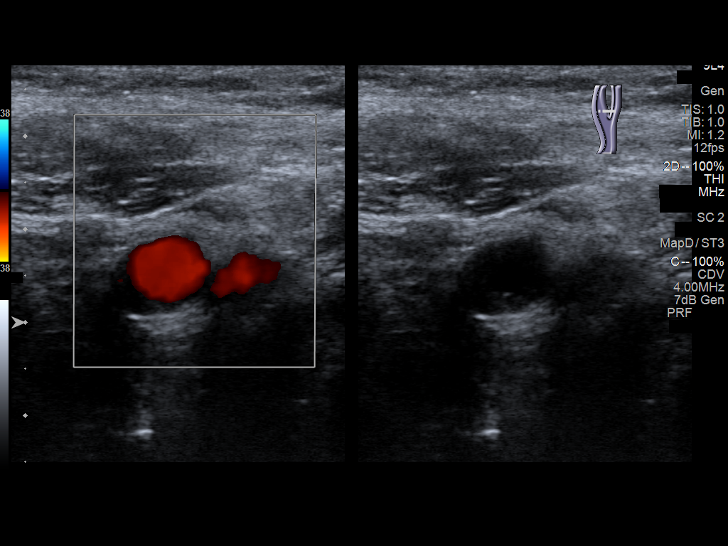
[im 23/65]
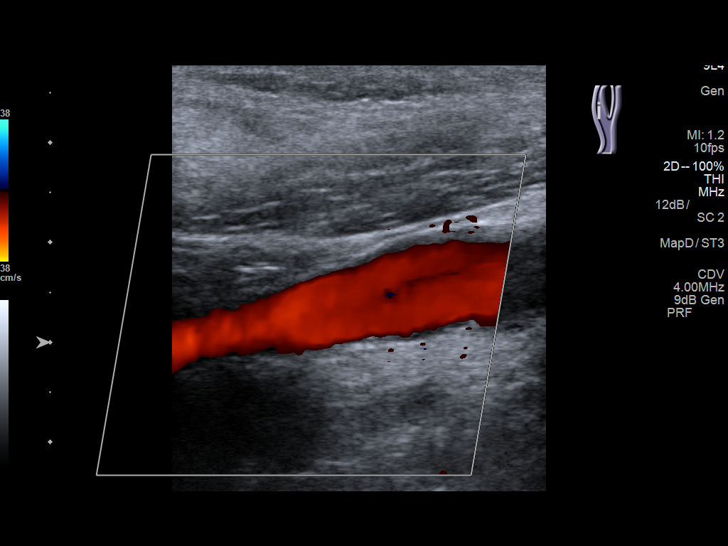
[im 28/65]
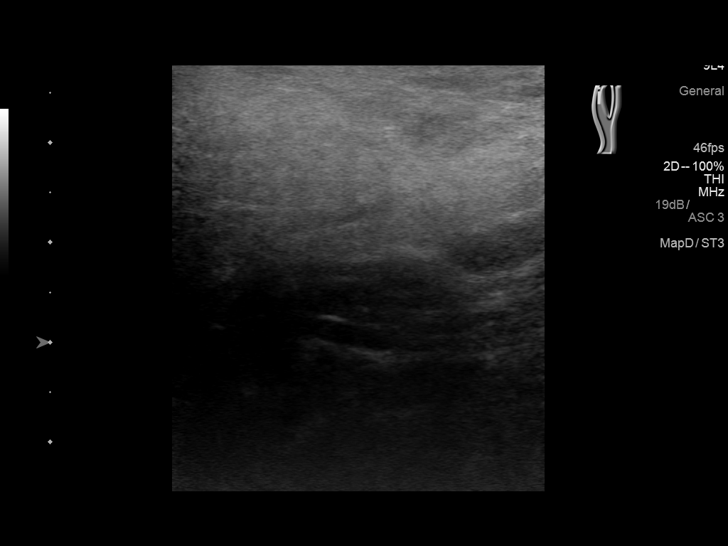
[im 34/65]
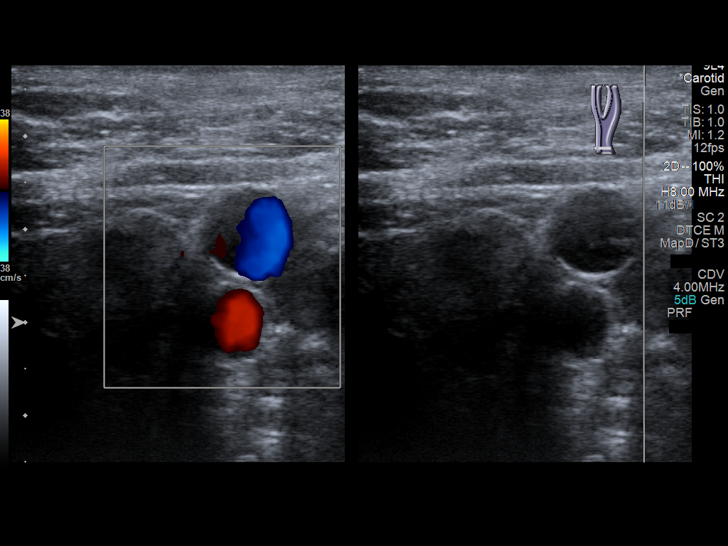
[im 37/65]
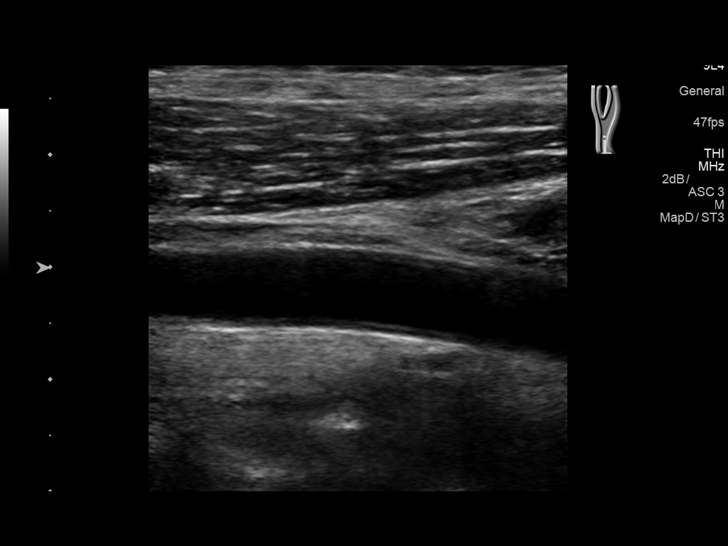
[im 42/65]
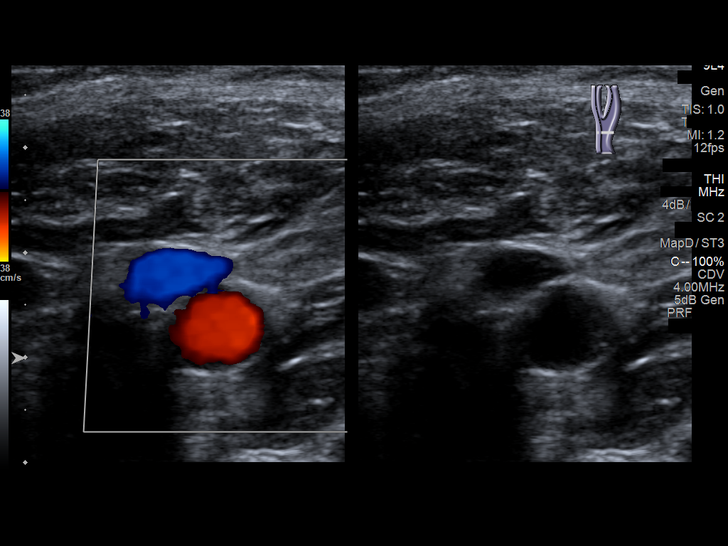
[im 48/65]
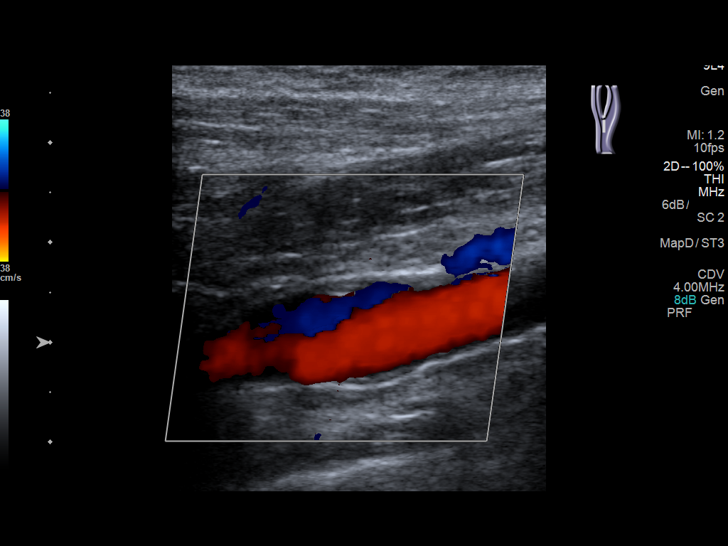
[im 53/65]
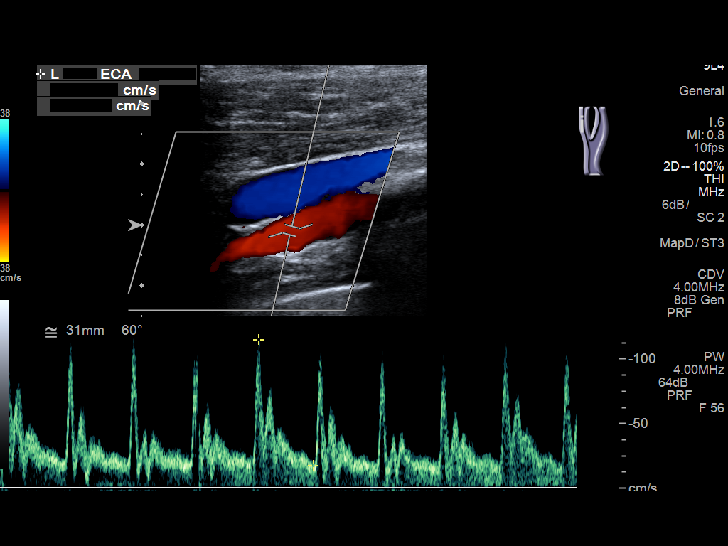
[im 59/65]
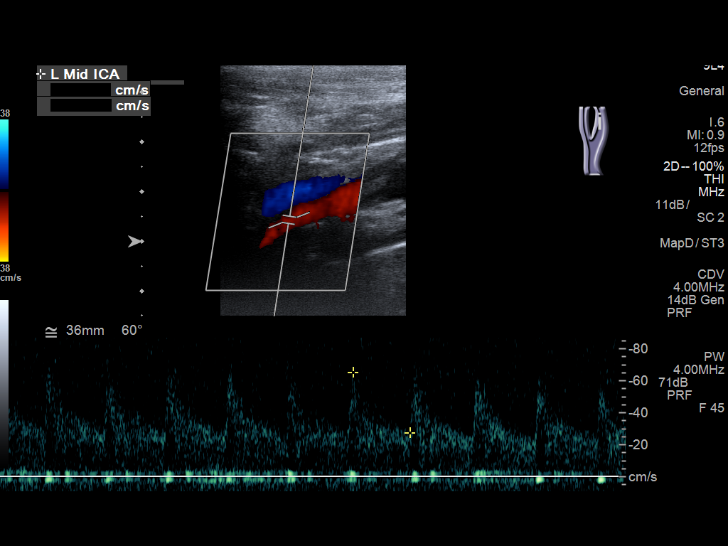
[im 65/65]
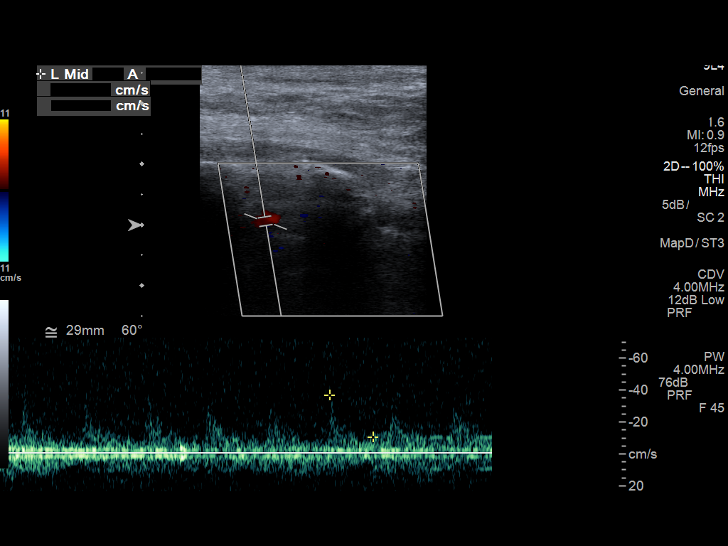

[13 of 24 positions shown; findings below may reference images not displayed]

FINDINGS: Criteria: Quantification of carotid stenosis is based on velocity
parameters that correlate the residual internal carotid diameter
with NASCET-based stenosis levels, using the diameter of the distal
internal carotid lumen as the denominator for stenosis measurement.

The following velocity measurements were obtained:

RIGHT

ICA:  Systolic 79 cm/sec, Diastolic 34 cm/sec

CCA:  95 cm/sec

SYSTOLIC ICA/CCA RATIO:

ECA:  101 cm/sec

LEFT

ICA:  Systolic 76 cm/sec, Diastolic 35 cm/sec

CCA:  109 cm/sec

SYSTOLIC ICA/CCA RATIO:

ECA:  115 cm/sec

Right Brachial SBP: Not acquired

Left Brachial SBP: Not acquired

RIGHT CAROTID ARTERY: No significant calcified disease of the right
common carotid artery. Intermediate waveform maintained.
Heterogeneous plaque without significant calcifications at the right
carotid bifurcation. Low resistance waveform of the right ICA. No
significant tortuosity.

RIGHT VERTEBRAL ARTERY: Antegrade flow with low resistance waveform.

LEFT CAROTID ARTERY: No significant calcified disease of the left
common carotid artery. Intermediate waveform maintained.
Heterogeneous plaque at the left carotid bifurcation without
significant calcifications. Low resistance waveform of the left ICA.

LEFT VERTEBRAL ARTERY:  Antegrade flow with low resistance waveform.
IMPRESSION: Color duplex indicates minimal heterogeneous plaque, with no
hemodynamically significant stenosis by duplex criteria in the
extracranial cerebrovascular circulation.

## 2017-01-04 ENCOUNTER — Emergency Department: Payer: Medicaid Other

## 2017-01-04 ENCOUNTER — Encounter: Payer: Self-pay | Admitting: Emergency Medicine

## 2017-01-04 ENCOUNTER — Emergency Department
Admission: EM | Admit: 2017-01-04 | Discharge: 2017-01-05 | Disposition: A | Discharge status: Home / Self Care | Payer: Medicaid Other | Attending: Emergency Medicine | Admitting: Emergency Medicine

## 2017-01-04 DIAGNOSIS — R1084 Generalized abdominal pain: Secondary | ICD-10-CM | POA: Insufficient documentation

## 2017-01-04 DIAGNOSIS — F10120 Alcohol abuse with intoxication, uncomplicated: Secondary | ICD-10-CM | POA: Diagnosis not present

## 2017-01-04 DIAGNOSIS — Z859 Personal history of malignant neoplasm, unspecified: Secondary | ICD-10-CM | POA: Insufficient documentation

## 2017-01-04 DIAGNOSIS — F17210 Nicotine dependence, cigarettes, uncomplicated: Secondary | ICD-10-CM | POA: Diagnosis not present

## 2017-01-04 DIAGNOSIS — R079 Chest pain, unspecified: Secondary | ICD-10-CM | POA: Diagnosis not present

## 2017-01-04 DIAGNOSIS — I10 Essential (primary) hypertension: Secondary | ICD-10-CM | POA: Diagnosis not present

## 2017-01-04 DIAGNOSIS — Z79899 Other long term (current) drug therapy: Secondary | ICD-10-CM | POA: Insufficient documentation

## 2017-01-04 DIAGNOSIS — F10920 Alcohol use, unspecified with intoxication, uncomplicated: Secondary | ICD-10-CM

## 2017-01-04 DIAGNOSIS — R410 Disorientation, unspecified: Secondary | ICD-10-CM | POA: Diagnosis present

## 2017-01-04 DIAGNOSIS — I2510 Atherosclerotic heart disease of native coronary artery without angina pectoris: Secondary | ICD-10-CM | POA: Insufficient documentation

## 2017-01-04 LAB — URINALYSIS, COMPLETE (UACMP) WITH MICROSCOPIC
BACTERIA UA: NONE SEEN
BILIRUBIN URINE: NEGATIVE
GLUCOSE, UA: NEGATIVE mg/dL
HGB URINE DIPSTICK: NEGATIVE
Ketones, ur: NEGATIVE mg/dL
LEUKOCYTES UA: NEGATIVE
NITRITE: NEGATIVE
PH: 5 (ref 5.0–8.0)
Protein, ur: NEGATIVE mg/dL
Squamous Epithelial / LPF: NONE SEEN

## 2017-01-04 LAB — COMPREHENSIVE METABOLIC PANEL
ALBUMIN: 4.4 g/dL (ref 3.5–5.0)
ALT: 55 U/L (ref 17–63)
ANION GAP: 10 (ref 5–15)
AST: 51 U/L — ABNORMAL HIGH (ref 15–41)
Alkaline Phosphatase: 78 U/L (ref 38–126)
BILIRUBIN TOTAL: 0.8 mg/dL (ref 0.3–1.2)
BUN: 12 mg/dL (ref 6–20)
CO2: 24 mmol/L (ref 22–32)
Calcium: 9 mg/dL (ref 8.9–10.3)
Chloride: 107 mmol/L (ref 101–111)
Creatinine, Ser: 0.81 mg/dL (ref 0.61–1.24)
GFR calc Af Amer: >60 mL/min (ref >60)
GFR calc non Af Amer: >60 mL/min (ref >60)
GLUCOSE: 126 mg/dL — AB (ref 65–99)
Potassium: 4.5 mmol/L (ref 3.5–5.1)
SODIUM: 141 mmol/L (ref 135–145)
TOTAL PROTEIN: 8 g/dL (ref 6.5–8.1)

## 2017-01-04 LAB — ETHANOL
Alcohol, Ethyl (B): 237 mg/dL — ABNORMAL HIGH (ref <5)
Alcohol, Ethyl (B): 111 mg/dL — ABNORMAL HIGH (ref <5)

## 2017-01-04 LAB — TROPONIN I: Troponin I: <0.03 ng/mL (ref <0.03)

## 2017-01-04 LAB — CBC WITH DIFFERENTIAL/PLATELET
BASOS ABS: 0.1 10*3/uL (ref 0–0.1)
BASOS PCT: 1 %
EOS ABS: 0.4 10*3/uL (ref 0–0.7)
Eosinophils Relative: 3 %
HEMATOCRIT: 56.1 % — AB (ref 40.0–52.0)
Hemoglobin: 18.9 g/dL — ABNORMAL HIGH (ref 13.0–18.0)
Lymphocytes Relative: 38 %
Lymphs Abs: 6.2 10*3/uL — ABNORMAL HIGH (ref 1.0–3.6)
MCH: 30 pg (ref 26.0–34.0)
MCHC: 33.6 g/dL (ref 32.0–36.0)
MCV: 89.3 fL (ref 80.0–100.0)
MONO ABS: 1 10*3/uL (ref 0.2–1.0)
Monocytes Relative: 6 %
NEUTROS ABS: 8.5 10*3/uL — AB (ref 1.4–6.5)
Neutrophils Relative %: 52 %
Platelets: 328 10*3/uL (ref 150–440)
RBC: 6.29 MIL/uL — ABNORMAL HIGH (ref 4.40–5.90)
RDW: 13.9 % (ref 11.5–14.5)
WBC: 16.2 10*3/uL — ABNORMAL HIGH (ref 3.8–10.6)

## 2017-01-04 LAB — URINE DRUG SCREEN, QUALITATIVE (ARMC ONLY)
Amphetamines, Ur Screen: NOT DETECTED
BARBITURATES, UR SCREEN: NOT DETECTED
Benzodiazepine, Ur Scrn: NOT DETECTED
COCAINE METABOLITE, UR ~~LOC~~: POSITIVE — AB
Cannabinoid 50 Ng, Ur ~~LOC~~: NOT DETECTED
MDMA (ECSTASY) UR SCREEN: NOT DETECTED
Methadone Scn, Ur: NOT DETECTED
OPIATE, UR SCREEN: NOT DETECTED
PHENCYCLIDINE (PCP) UR S: NOT DETECTED
Tricyclic, Ur Screen: NOT DETECTED

## 2017-01-04 LAB — SALICYLATE LEVEL

## 2017-01-04 LAB — ACETAMINOPHEN LEVEL: Acetaminophen (Tylenol), Serum: <10 ug/mL — ABNORMAL LOW (ref 10–30)

## 2017-01-04 LAB — INFLUENZA PANEL BY PCR (TYPE A & B)
Influenza A By PCR: NEGATIVE
Influenza B By PCR: NEGATIVE

## 2017-01-04 LAB — LIPASE, BLOOD: LIPASE: 28 U/L (ref 11–51)

## 2017-01-04 MED ORDER — DEXTROSE 5 % IV BOLUS
1000.0000 mL | Freq: Once | INTRAVENOUS | Status: AC
  Administered 2017-01-04: 1000 mL via INTRAVENOUS
  Filled 2017-01-04: qty 1000

## 2017-01-04 MED ORDER — GI COCKTAIL ~~LOC~~: 30.0000 mL | ORAL | Status: DC

## 2017-01-04 MED ORDER — GI COCKTAIL ~~LOC~~
30.0000 mL | ORAL | Status: AC
  Administered 2017-01-04: 30 mL via ORAL
  Filled 2017-01-04: qty 30

## 2017-01-04 MED ORDER — KETOROLAC TROMETHAMINE 30 MG/ML IJ SOLN
15.0000 mg | INTRAMUSCULAR | Status: AC
  Administered 2017-01-04: 15 mg via INTRAVENOUS
  Filled 2017-01-04: qty 1

## 2017-01-04 MED ORDER — FOLIC ACID 5 MG/ML IJ SOLN: 1.0000 mg | Freq: Every day | INTRAMUSCULAR | Status: DC

## 2017-01-04 MED ORDER — IOPAMIDOL (ISOVUE-370) INJECTION 76%
100.0000 mL | Freq: Once | INTRAVENOUS | Status: AC | PRN
  Administered 2017-01-04: 100 mL via INTRAVENOUS

## 2017-01-04 MED ORDER — LORAZEPAM 2 MG/ML IJ SOLN
1.0000 mg | Freq: Once | INTRAMUSCULAR | Status: AC
  Administered 2017-01-04: 1 mg via INTRAVENOUS
  Filled 2017-01-04: qty 1

## 2017-01-04 MED ORDER — FAMOTIDINE 20 MG PO TABS: 40.0000 mg | ORAL_TABLET | Freq: Once | ORAL | Status: DC

## 2017-01-04 MED ORDER — PANTOPRAZOLE SODIUM 40 MG PO TBEC: 40.0000 mg | DELAYED_RELEASE_TABLET | Freq: Once | ORAL | Status: DC

## 2017-01-04 MED ORDER — LORAZEPAM 2 MG/ML IJ SOLN
1.0000 mg | Freq: Once | INTRAMUSCULAR | Status: AC
  Administered 2017-01-04: 1 mg via INTRAVENOUS

## 2017-01-04 MED ORDER — SODIUM CHLORIDE 0.9 % IV SOLN
1.0000 mg | Freq: Once | INTRAVENOUS | Status: AC
  Administered 2017-01-04: 1 mg via INTRAVENOUS
  Filled 2017-01-04: qty 0.2

## 2017-01-04 MED ORDER — LORAZEPAM 2 MG/ML IJ SOLN
INTRAMUSCULAR | Status: AC
  Administered 2017-01-04: 1 mg via INTRAVENOUS
  Filled 2017-01-04: qty 1

## 2017-01-04 MED ORDER — CALCIUM CARBONATE ANTACID 500 MG PO CHEW: 2.0000 | CHEWABLE_TABLET | Freq: Three times a day (TID) | ORAL | 0 refills | Status: DC | PRN

## 2017-01-04 MED ORDER — SODIUM CHLORIDE 0.9 % IV BOLUS (SEPSIS)
1000.0000 mL | Freq: Once | INTRAVENOUS | Status: AC
  Administered 2017-01-04: 1000 mL via INTRAVENOUS

## 2017-01-04 MED ORDER — FAMOTIDINE 20 MG PO TABS: 20.0000 mg | ORAL_TABLET | Freq: Two times a day (BID) | ORAL | 0 refills | Status: DC

## 2017-01-04 MED ORDER — THIAMINE HCL 100 MG/ML IJ SOLN
100.0000 mg | Freq: Once | INTRAMUSCULAR | Status: AC
  Administered 2017-01-04: 100 mg via INTRAVENOUS
  Filled 2017-01-04: qty 2

## 2017-01-04 NOTE — ED Notes (Signed)
Pt given water and the remote by Margarita Grizzle, Therapist, sports.

## 2017-01-04 NOTE — Progress Notes (Signed)
TTS contacted [facility] with the updated lab results which were below the needed alcohol level.  Nurse Lovey Newcomer states that the patient is being refused due to his medical issues.  The ED physician states that the patient is clear and has no active medical concerns.  TTS informed Lovey Newcomer of the physician's report. Lovey Newcomer denied the patient and stated that she did not want to be responsible for this patient.  ED physician has been informed.

## 2017-01-04 NOTE — ED Notes (Signed)
Medications administered at this time per MD order. Pt tolerated well. Pt is noted to be able to speak without a stutter at this time. Pt is also noted to be more calm, pt resting in bed, given remote to watch TV at this time. Will continue to monitor for further patient needs.

## 2017-01-04 NOTE — ED Notes (Signed)
Pt given a cup of ice and a cup of Sprite by this RN. Pt visualized in NAD at this time. Explained to patient that this RN would be handing over care to next RN. Pt states understanding at this time.

## 2017-01-04 NOTE — ED Notes (Signed)
Pt taken to CT at this time.

## 2017-01-04 NOTE — ED Notes (Signed)
This RN to bedside at this time. This RN explained delay to patient and apologized. Pt states "I'm hurting and if I don't get anything for it I'm going to start throwing stuff". This RN explained to patient that he could not throw stuff at staff and that this RN could not administer pain medication without MD order, and thus far this RN had not received pain medication orders. MD notified.

## 2017-01-04 NOTE — Progress Notes (Signed)
TTS spoke with Ian [name] at [facility].  TTS was informed that the patient would not be allowed to take Klonopin or Tylenol with Codine while he is a patient at their facility.  The TTS informed the ED physcian, who spoke with Ian [name]. He agreed and wants to go to treatment.  Mark then informed TTS that the patient's Blood alcohol needs to be under 140.  Additional test was requested. Patient's transfer is pending, waiting on lab results.

## 2017-01-04 NOTE — ED Triage Notes (Signed)
Pt presents to ED via [facility] from home. Per EMS pt was found outside on his porch by neighborhood kids who called their parents, EMS reports they were called out for seizure. EMS reports pt is an alcoholic according to his neighbor. Pt is noted to have a tremor to his R arm. Pt is noted to be SHOB, tachypnea. Pt is unable to form a complete sentence due to tachypnea and tremor.

## 2017-01-04 NOTE — ED Notes (Signed)
Flu swab collected by this RN. Pt tolerated well. NAD will continue to monitor for further patient needs. Pt also given a cup of ice at this time, states that the ice makes him feel better.

## 2017-01-04 NOTE — ED Provider Notes (Signed)
[facility] Emergency Department Provider Note  ____________________________________________  Time seen: Approximately 4:20 PM  I have reviewed the triage vital signs and the nursing notes.   HISTORY  Chief Complaint Altered Mental Status Level 5 caveat:  Portions of the history and physical were unable to be obtained due to the patient's acute illness and altered mental status    HPI Ian [name] is a 56 y.o. male brought to the ED by EMS due to shaking and confusion. Patient complains of a burning pain in his chest which is nonradiating and feels like he has something stuck in his esophagus. He has a history of Barrett's esophagus. Patient denies shortness of breath. States that he drinks beer and liquor daily.  States the chest pain radiates to his back and upper abdomen. Worse with eating. He's been told that food will get stuck in his esophagus and he may need to have a procedure for some point.   Past Medical History:  Diagnosis Date  . Anxiety   . Barrett esophagus   . Cancer ([place])   . Coronary artery disease   . Depression   . GERD (gastroesophageal reflux disease)   . Hypertension   . Stroke [facility]) 2014   "mini-stroke" per patient     Patient Active Problem List   Diagnosis Date Noted  . Alcohol abuse 10/06/2016  . Substance induced mood disorder ([place]) 10/06/2016  . Cocaine abuse 10/06/2016  . Depression 10/06/2016  . Acute left-sided weakness 05/03/2015  . CVA (cerebral infarction) 05/03/2015  . Weakness 11/16/2013  . Chest pain 11/16/2013  . HTN (hypertension) 11/16/2013  . Left-sided weakness 11/16/2013  . Torsades de pointes ([place]) 11/16/2013  . Hemiplegia, unspecified, affecting nondominant side 11/15/2013  . Speech and language deficits 11/15/2013     Past Surgical History:  Procedure Laterality Date  . ANGIOPLASTY    . APPENDECTOMY    . CARDIAC CATHETERIZATION    . CHOLECYSTECTOMY    . WRIST SURGERY Right    age 57      Prior to Admission medications   Medication Sig Start Date End Date Taking? Authorizing Provider  acetaminophen-codeine (TYLENOL #3) 300-30 MG tablet Take 1 tablet by mouth every 4 (four) hours as needed for pain.   Yes Historical Provider, MD  citalopram (CELEXA) 20 MG tablet Take 20 mg by mouth at bedtime. 04/12/15  Yes Historical Provider, MD  clonazePAM (KLONOPIN) 0.5 MG tablet Take 0.5 mg by mouth 2 (two) times daily as needed for anxiety.    Yes Historical Provider, MD  hydrOXYzine (ATARAX/VISTARIL) 50 MG tablet Take 1 tablet by mouth 2 (two) times daily as needed. 03/01/15  Yes Historical Provider, MD  lisinopril (PRINIVIL,ZESTRIL) 10 MG tablet Take 10 mg by mouth daily. 03/01/15  Yes Historical Provider, MD  pantoprazole (PROTONIX) 40 MG tablet Take 40 mg by mouth daily.   Yes Historical Provider, MD  PROAIR HFA 108 (90 BASE) MCG/ACT inhaler Inhale 1-2 puffs into the lungs every 6 (six) hours as needed for wheezing or shortness of breath.    Yes Historical Provider, MD  QUEtiapine (SEROQUEL) 300 MG tablet Take 1 tablet (300 mg total) by mouth at bedtime. Patient taking differently: Take 400 mg by mouth at bedtime.  11/17/13  Yes Nita Sells, MD  aspirin EC 81 MG EC tablet Take 1 tablet (81 mg total) by mouth daily. Patient not taking: Reported on 01/04/2017 05/07/15   Fritzi Mandes, MD  atorvastatin (LIPITOR) 40 MG tablet Take 1 tablet (  40 mg total) by mouth daily at 6 PM. 09/03/16 10/03/16  Dustin Flock, MD  calcium carbonate (TUMS) 500 MG chewable tablet Chew 2 tablets (400 mg of elemental calcium total) by mouth 3 (three) times daily as needed for indigestion or heartburn. 01/04/17 01/04/18  Carrie Mew, MD  famotidine (PEPCID) 20 MG tablet Take 1 tablet (20 mg total) by mouth 2 (two) times daily. 01/04/17   Carrie Mew, MD  gabapentin (NEURONTIN) 100 MG capsule Take 1 capsule (100 mg total) by mouth 3 (three) times daily. 08/13/15 08/12/16  Nance Pear, MD  ondansetron  (ZOFRAN) 4 MG tablet Take 1 tablet (4 mg total) by mouth daily as needed for nausea or vomiting. Patient not taking: Reported on 09/01/2016 07/09/15   Ahmed Prima, MD  oxyCODONE-acetaminophen (ROXICET) 5-325 MG per tablet Take 1 tablet by mouth every 6 (six) hours as needed. Patient not taking: Reported on 09/01/2016 07/09/15   Ahmed Prima, MD  QUEtiapine (SEROQUEL) 50 MG tablet Take 1 tablet (50 mg total) by mouth daily at 12 noon. Patient taking differently: Take 50 mg by mouth 2 (two) times daily.  11/17/13   Nita Sells, MD  thiamine 50 MG tablet Take 1 tablet (50 mg total) by mouth daily. Patient not taking: Reported on 01/04/2017 05/07/15   Fritzi Mandes, MD     Allergies Fluoxetine and Hydrocodone-acetaminophen   Family History  Problem Relation Age of Onset  . Stroke Father   . Leukemia Father   . Breast cancer Sister     Social History Social History  Substance Use Topics  . Smoking status: Current Every Day Smoker    Packs/day: 1.00    Types: Cigarettes  . Smokeless tobacco: Never Used  . Alcohol use Yes     Comment: denies being a daily drinker but did have alcohol today    Review of Systems  Constitutional:   No fever or chills.  ENT:  Positive throat pain. Cardiovascular:   Positive chest pain. Respiratory:   No dyspnea or cough. Gastrointestinal:   Negative for abdominal pain, vomiting and diarrhea.  Genitourinary:   Negative for dysuria or difficulty urinating. Neurological:   Negative for headaches 10-point ROS otherwise negative.  ____________________________________________   PHYSICAL EXAM:  VITAL SIGNS: ED Triage Vitals  Enc Vitals Group     BP 01/04/17 1611 130/88     Pulse Rate 01/04/17 1611 98     Resp 01/04/17 1611 (!) 33     Temp 01/04/17 1611 97.6 F (36.4 C)     Temp Source 01/04/17 1611 Axillary     SpO2 01/04/17 1605 98 %     Weight 01/04/17 1609 250 lb (113.4 kg)     Height 01/04/17 1609 6' (1.829 m)     Head  Circumference --      Peak Flow --      Pain Score --      Pain Loc --      Pain Edu? --      Excl. in [place]? --     Vital signs reviewed, nursing assessments reviewed.   Constitutional:   Alert and orientedTo self. Ill-appearing. Eyes:   No scleral icterus. No conjunctival pallor. PERRL. EOMI.  No nystagmus. ENT   Head:   Normocephalic and atraumatic.   Nose:   No congestion/rhinnorhea. No septal hematoma   Mouth/Throat:   Dry mucous membranes, no pharyngeal erythema. No peritonsillar mass.    Neck:   No stridor. No SubQ emphysema. No meningismus.  Hematological/Lymphatic/Immunilogical:   No cervical lymphadenopathy. Cardiovascular:   RRR. Symmetric bilateral radial and DP pulses.  No murmurs.  Respiratory:   Normal respiratory effort without tachypnea nor retractions. Breath sounds are clear and equal bilaterally. No wheezes/rales/rhonchi. Gastrointestinal:   Soft with diffuse abdominal tenderness. Non distended. There is no CVA tenderness.  No rebound, rigidity, or guarding. Genitourinary:   deferred Musculoskeletal:   Nontender with normal range of motion in all extremities. No joint effusions.  No lower extremity tenderness.  No edema. Neurologic:   Stuttering speech, restricted language.  CN 2-10 normal. Right upper extremity tremor that resolves with rest and distraction, worse with movement of the arm.  No gross focal neurologic deficits are appreciated.  Skin:    Skin is warm, dry and intact. No rash noted.  No petechiae, purpura, or bullae.  ____________________________________________    LABS (pertinent positives/negatives) (all labs ordered are listed, but only abnormal results are displayed) Labs Reviewed  COMPREHENSIVE METABOLIC PANEL - Abnormal; Notable for the following:       Result Value   Glucose, Bld 126 (*)    AST 51 (*)    All other components within normal limits  ACETAMINOPHEN LEVEL - Abnormal; Notable for the following:    Acetaminophen  (Tylenol), Serum <10 (*)    All other components within normal limits  ETHANOL - Abnormal; Notable for the following:    Alcohol, Ethyl (B) 237 (*)    All other components within normal limits  CBC WITH DIFFERENTIAL/PLATELET - Abnormal; Notable for the following:    WBC 16.2 (*)    RBC 6.29 (*)    Hemoglobin 18.9 (*)    HCT 56.1 (*)    Neutro Abs 8.5 (*)    Lymphs Abs 6.2 (*)    All other components within normal limits  SALICYLATE LEVEL  LIPASE, BLOOD  URINALYSIS, COMPLETE (UACMP) WITH MICROSCOPIC  URINE DRUG SCREEN, QUALITATIVE ([facility] ONLY)  INFLUENZA PANEL BY PCR (TYPE A & B, H1N1)  TROPONIN I   ____________________________________________   EKG  Interpreted by me Normal sinus rhythm rate of 98, normal axis and intervals. Normal QRS ST segments and T waves.  ____________________________________________    RADIOLOGY  CT angiogram chest abdomen pelvis unremarkable.   ____________________________________________   PROCEDURES Procedures  ____________________________________________   INITIAL IMPRESSION / ASSESSMENT AND PLAN / ED COURSE  Pertinent labs & imaging results that were available during my care of the patient were reviewed by me and considered in my medical decision making (see chart for details).  Patient presents with confusion, poor history, shaking in the right arm clinically dehydrated. We'll give IV fluids with thiamine folate and dextrose due to likely dehydration and possible ketoacidosis from alcoholism. Check chest x-ray, may need CT abdomen as well.    ----------------------------------------- 8:12 PM on 01/04/2017 -----------------------------------------  Patient is calm and comfortable, vital signs unremarkable. CT is negative. Tolerating oral intake. Low suspicion for any occult cardiopulmonary or abdominal pathology. Unlikely ACS. This is likely due to his esophagitis from alcohol abuse including heavy alcohol intake today. He is not  currently intoxicated, clinically sober. Suitable for discharge home. I'll encourage him again to take Tums and antacids. Follow-up with primary care and GI.   Clinical Course    ____________________________________________   FINAL CLINICAL IMPRESSION(S) / ED DIAGNOSES  Final diagnoses:  Alcoholic intoxication without complication (HCC)  Nonspecific chest pain      New Prescriptions   CALCIUM CARBONATE (TUMS) 500 MG CHEWABLE TABLET    Chew  2 tablets (400 mg of elemental calcium total) by mouth 3 (three) times daily as needed for indigestion or heartburn.   FAMOTIDINE (PEPCID) 20 MG TABLET    Take 1 tablet (20 mg total) by mouth 2 (two) times daily.     Portions of this note were generated with dragon dictation software. Dictation errors may occur despite best attempts at proofreading.    Carrie Mew, MD 01/04/17 2013

## 2017-01-04 NOTE — ED Notes (Signed)
This RN spoke with MD, per MD do not wait for flu test results, pt is to receive GI cocktail and be D/C.

## 2017-01-04 NOTE — Progress Notes (Signed)
Patient is requesting detox services. TTS faxed referral information to [facility] for admission review.

## 2017-01-04 NOTE — ED Notes (Signed)
This RN notified by Amy, RN that patient was given GI cocktail and she attempted to D/C when patient stated that he wanted to "wear the purple scrubs". This RN conferred with MD who states patient is to be D/C to [facility]. Pt visualized in NAD at this time. Pt given a cup of ice per his request. Will continue to monitor for further patient needs.

## 2017-01-04 NOTE — ED Notes (Signed)
Report given to Jenna, RN

## 2017-01-04 NOTE — ED Notes (Signed)
Pt visualized in NAD. Pt resting in bed with lights on and TV on. Pt denies any needs at this time. Will continue to monitor for further patient needs.

## 2017-01-04 NOTE — ED Provider Notes (Signed)
Patient was attempting to be discharged when he stated that he wants detox. States "send him home because he needs to go to a detox facility. Denies any SI or HI currently. We will discuss with TTS for possible [facility] referral for the patient. He is agreeable to this.   Harvest Dark, MD 01/04/17 2101

## 2017-01-04 NOTE — ED Notes (Signed)
Pt returned from CT at this time.  

## 2017-01-04 NOTE — ED Notes (Signed)
Pt noted to be able to speak better and is more coherent after receiving 1mg  of Ativan.

## 2017-01-06 ENCOUNTER — Encounter: Payer: Self-pay | Admitting: Emergency Medicine

## 2017-01-06 ENCOUNTER — Emergency Department
Admission: EM | Admit: 2017-01-06 | Discharge: 2017-01-06 | Disposition: A | Discharge status: Other Acute Inpatient Hospital | Payer: Medicaid Other | Attending: Emergency Medicine | Admitting: Emergency Medicine

## 2017-01-06 ENCOUNTER — Inpatient Hospital Stay
Admission: EM | Admit: 2017-01-06 | Discharge: 2017-01-13 | DRG: 885 | Disposition: A | Discharge status: Home / Self Care | Payer: Medicaid Other | Source: Intra-hospital | Attending: Psychiatry | Admitting: Psychiatry

## 2017-01-06 DIAGNOSIS — F410 Panic disorder [episodic paroxysmal anxiety] without agoraphobia: Secondary | ICD-10-CM | POA: Diagnosis present

## 2017-01-06 DIAGNOSIS — R45851 Suicidal ideations: Secondary | ICD-10-CM | POA: Diagnosis present

## 2017-01-06 DIAGNOSIS — F1994 Other psychoactive substance use, unspecified with psychoactive substance-induced mood disorder: Secondary | ICD-10-CM | POA: Diagnosis present

## 2017-01-06 DIAGNOSIS — F4010 Social phobia, unspecified: Secondary | ICD-10-CM | POA: Diagnosis present

## 2017-01-06 DIAGNOSIS — F1220 Cannabis dependence, uncomplicated: Secondary | ICD-10-CM | POA: Diagnosis present

## 2017-01-06 DIAGNOSIS — K2270 Barrett's esophagus without dysplasia: Secondary | ICD-10-CM | POA: Diagnosis present

## 2017-01-06 DIAGNOSIS — F332 Major depressive disorder, recurrent severe without psychotic features: Secondary | ICD-10-CM

## 2017-01-06 DIAGNOSIS — F329 Major depressive disorder, single episode, unspecified: Secondary | ICD-10-CM | POA: Diagnosis not present

## 2017-01-06 DIAGNOSIS — E785 Hyperlipidemia, unspecified: Secondary | ICD-10-CM | POA: Diagnosis present

## 2017-01-06 DIAGNOSIS — F17200 Nicotine dependence, unspecified, uncomplicated: Secondary | ICD-10-CM | POA: Diagnosis present

## 2017-01-06 DIAGNOSIS — J449 Chronic obstructive pulmonary disease, unspecified: Secondary | ICD-10-CM | POA: Diagnosis present

## 2017-01-06 DIAGNOSIS — Z79899 Other long term (current) drug therapy: Secondary | ICD-10-CM | POA: Diagnosis not present

## 2017-01-06 DIAGNOSIS — Z859 Personal history of malignant neoplasm, unspecified: Secondary | ICD-10-CM | POA: Diagnosis not present

## 2017-01-06 DIAGNOSIS — I10 Essential (primary) hypertension: Secondary | ICD-10-CM | POA: Diagnosis present

## 2017-01-06 DIAGNOSIS — F1420 Cocaine dependence, uncomplicated: Secondary | ICD-10-CM | POA: Diagnosis present

## 2017-01-06 DIAGNOSIS — F319 Bipolar disorder, unspecified: Secondary | ICD-10-CM | POA: Diagnosis present

## 2017-01-06 DIAGNOSIS — Z806 Family history of leukemia: Secondary | ICD-10-CM | POA: Diagnosis not present

## 2017-01-06 DIAGNOSIS — I2510 Atherosclerotic heart disease of native coronary artery without angina pectoris: Secondary | ICD-10-CM | POA: Diagnosis not present

## 2017-01-06 DIAGNOSIS — Z8673 Personal history of transient ischemic attack (TIA), and cerebral infarction without residual deficits: Secondary | ICD-10-CM

## 2017-01-06 DIAGNOSIS — Z888 Allergy status to other drugs, medicaments and biological substances status: Secondary | ICD-10-CM

## 2017-01-06 DIAGNOSIS — I4581 Long QT syndrome: Secondary | ICD-10-CM | POA: Diagnosis not present

## 2017-01-06 DIAGNOSIS — Z915 Personal history of self-harm: Secondary | ICD-10-CM | POA: Diagnosis not present

## 2017-01-06 DIAGNOSIS — Z9049 Acquired absence of other specified parts of digestive tract: Secondary | ICD-10-CM

## 2017-01-06 DIAGNOSIS — F17210 Nicotine dependence, cigarettes, uncomplicated: Secondary | ICD-10-CM | POA: Insufficient documentation

## 2017-01-06 DIAGNOSIS — Z7982 Long term (current) use of aspirin: Secondary | ICD-10-CM | POA: Insufficient documentation

## 2017-01-06 DIAGNOSIS — Z638 Other specified problems related to primary support group: Secondary | ICD-10-CM | POA: Diagnosis not present

## 2017-01-06 DIAGNOSIS — F1020 Alcohol dependence, uncomplicated: Secondary | ICD-10-CM | POA: Diagnosis present

## 2017-01-06 DIAGNOSIS — K219 Gastro-esophageal reflux disease without esophagitis: Secondary | ICD-10-CM | POA: Diagnosis present

## 2017-01-06 DIAGNOSIS — Z9889 Other specified postprocedural states: Secondary | ICD-10-CM

## 2017-01-06 DIAGNOSIS — G4700 Insomnia, unspecified: Secondary | ICD-10-CM | POA: Diagnosis present

## 2017-01-06 DIAGNOSIS — Z823 Family history of stroke: Secondary | ICD-10-CM

## 2017-01-06 DIAGNOSIS — Z803 Family history of malignant neoplasm of breast: Secondary | ICD-10-CM

## 2017-01-06 DIAGNOSIS — F3130 Bipolar disorder, current episode depressed, mild or moderate severity, unspecified: Secondary | ICD-10-CM | POA: Diagnosis not present

## 2017-01-06 LAB — URINE DRUG SCREEN, QUALITATIVE (ARMC ONLY)
AMPHETAMINES, UR SCREEN: NOT DETECTED
BENZODIAZEPINE, UR SCRN: NOT DETECTED
Barbiturates, Ur Screen: POSITIVE — AB
Cannabinoid 50 Ng, Ur ~~LOC~~: NOT DETECTED
Cocaine Metabolite,Ur ~~LOC~~: POSITIVE — AB
MDMA (ECSTASY) UR SCREEN: NOT DETECTED
Methadone Scn, Ur: NOT DETECTED
OPIATE, UR SCREEN: NOT DETECTED
PHENCYCLIDINE (PCP) UR S: NOT DETECTED
Tricyclic, Ur Screen: POSITIVE — AB

## 2017-01-06 LAB — CBC
HEMATOCRIT: 47.3 % (ref 40.0–52.0)
Hemoglobin: 16.4 g/dL (ref 13.0–18.0)
MCH: 30.8 pg (ref 26.0–34.0)
MCHC: 34.7 g/dL (ref 32.0–36.0)
MCV: 88.9 fL (ref 80.0–100.0)
Platelets: 232 10*3/uL (ref 150–440)
RBC: 5.32 MIL/uL (ref 4.40–5.90)
RDW: 13.4 % (ref 11.5–14.5)
WBC: 8.3 10*3/uL (ref 3.8–10.6)

## 2017-01-06 LAB — COMPREHENSIVE METABOLIC PANEL
ALBUMIN: 4.1 g/dL (ref 3.5–5.0)
ALK PHOS: 69 U/L (ref 38–126)
ALT: 76 U/L — AB (ref 17–63)
AST: 70 U/L — AB (ref 15–41)
Anion gap: 9 (ref 5–15)
BILIRUBIN TOTAL: 1.3 mg/dL — AB (ref 0.3–1.2)
BUN: 14 mg/dL (ref 6–20)
CO2: 24 mmol/L (ref 22–32)
CREATININE: 0.85 mg/dL (ref 0.61–1.24)
Calcium: 8.9 mg/dL (ref 8.9–10.3)
Chloride: 104 mmol/L (ref 101–111)
GFR calc Af Amer: >60 mL/min (ref >60)
GFR calc non Af Amer: >60 mL/min (ref >60)
GLUCOSE: 99 mg/dL (ref 65–99)
POTASSIUM: 4.2 mmol/L (ref 3.5–5.1)
Sodium: 137 mmol/L (ref 135–145)
TOTAL PROTEIN: 7.4 g/dL (ref 6.5–8.1)

## 2017-01-06 LAB — ACETAMINOPHEN LEVEL: Acetaminophen (Tylenol), Serum: 18 ug/mL (ref 10–30)

## 2017-01-06 LAB — ETHANOL: Alcohol, Ethyl (B): <5 mg/dL (ref <5)

## 2017-01-06 LAB — SALICYLATE LEVEL: Salicylate Lvl: <7 mg/dL (ref 2.8–30.0)

## 2017-01-06 MED ORDER — LORAZEPAM 2 MG PO TABS
2.0000 mg | ORAL_TABLET | Freq: Once | ORAL | Status: AC
  Administered 2017-01-06: 2 mg via ORAL
  Filled 2017-01-06: qty 1

## 2017-01-06 MED ORDER — NICOTINE 21 MG/24HR TD PT24
21.0000 mg | MEDICATED_PATCH | Freq: Every day | TRANSDERMAL | Status: DC
  Administered 2017-01-07 – 2017-01-13 (×7): 21 mg via TRANSDERMAL
  Filled 2017-01-06 (×7): qty 1

## 2017-01-06 MED ORDER — ATORVASTATIN CALCIUM 20 MG PO TABS
40.0000 mg | ORAL_TABLET | Freq: Every day | ORAL | Status: DC
  Administered 2017-01-06: 40 mg via ORAL
  Filled 2017-01-06: qty 2

## 2017-01-06 MED ORDER — LORAZEPAM 2 MG PO TABS
ORAL_TABLET | ORAL | Status: AC
  Filled 2017-01-06: qty 1

## 2017-01-06 MED ORDER — FAMOTIDINE 20 MG PO TABS
20.0000 mg | ORAL_TABLET | Freq: Once | ORAL | Status: AC
  Administered 2017-01-06: 20 mg via ORAL
  Filled 2017-01-06: qty 1

## 2017-01-06 MED ORDER — QUETIAPINE FUMARATE ER 50 MG PO TB24
50.0000 mg | ORAL_TABLET | Freq: Once | ORAL | Status: AC
  Administered 2017-01-06: 50 mg via ORAL
  Filled 2017-01-06: qty 1

## 2017-01-06 MED ORDER — CLONAZEPAM 0.5 MG PO TABS: 0.5000 mg | ORAL_TABLET | Freq: Two times a day (BID) | ORAL | Status: DC

## 2017-01-06 MED ORDER — ALUM & MAG HYDROXIDE-SIMETH 200-200-20 MG/5ML PO SUSP
30.0000 mL | ORAL | Status: DC | PRN
  Administered 2017-01-09 – 2017-01-13 (×4): 30 mL via ORAL
  Filled 2017-01-06 (×4): qty 30

## 2017-01-06 MED ORDER — LISINOPRIL 10 MG PO TABS
10.0000 mg | ORAL_TABLET | Freq: Once | ORAL | Status: AC
  Administered 2017-01-06: 10 mg via ORAL
  Filled 2017-01-06: qty 1

## 2017-01-06 MED ORDER — LORAZEPAM 2 MG PO TABS
2.0000 mg | ORAL_TABLET | Freq: Once | ORAL | Status: AC
  Administered 2017-01-06: 2 mg via ORAL

## 2017-01-06 MED ORDER — MAGNESIUM HYDROXIDE 400 MG/5ML PO SUSP: 30.0000 mL | Freq: Every day | ORAL | Status: DC | PRN

## 2017-01-06 MED ORDER — ACETAMINOPHEN 325 MG PO TABS
650.0000 mg | ORAL_TABLET | Freq: Four times a day (QID) | ORAL | Status: DC | PRN
  Administered 2017-01-08 – 2017-01-11 (×2): 650 mg via ORAL
  Filled 2017-01-06 (×2): qty 2

## 2017-01-06 NOTE — ED Notes (Signed)
Pt given graham crackers and juice

## 2017-01-06 NOTE — ED Notes (Signed)
Pt up to the bathroom

## 2017-01-06 NOTE — [facility] Notes (Signed)
[facility] Notes:  (Nursing/MHT/Case Management/Adjunct)  Date:  01/06/2017  Time:  9:39 PM  Type of Therapy:  Psychoeducational Skills  Participation Level:  Did Not Attend  Participation Quality:  New Admit  Affect: Nehemiah Settle 01/06/2017, 9:39 PM

## 2017-01-06 NOTE — ED Notes (Signed)
Nurse advised of pt complaining of his bottom being raw due to diarrhea. Pt was given clear moisture barrier ointment to place on his bottom. Pt advised that it did help.

## 2017-01-06 NOTE — ED Provider Notes (Signed)
Time Seen: Approximately [phone]  I have reviewed the triage notes  Chief Complaint: Suicidal   History of Present Illness: Ian [name] is a 56 y.o. male who presents voluntarily with suicidal ideation. The patient states he didn't have any specific plan but states that if he was going to harm himself he would "" jump out into traffic "". He denies any current hallucinations or homicidal thoughts. Patient was recently seen and evaluated here for substance abuse and has a history of cocaine and alcohol abuse. He states he hasn't had any cocaine or alcohol in the last 4 days. He states he has a medical history of hypertension and has not been on any of his hypertensive medication. He denies any other physical complaints other than history of chronic reflux. He states he does have a history of suicidal ideation and attempt Past Medical History:  Diagnosis Date  . Anxiety   . Barrett esophagus   . Cancer ([place])   . Coronary artery disease   . Depression   . GERD (gastroesophageal reflux disease)   . Hypertension   . Stroke [facility]) 2014   "mini-stroke" per patient    Patient Active Problem List   Diagnosis Date Noted  . Alcohol abuse 10/06/2016  . Substance induced mood disorder ([place]) 10/06/2016  . Cocaine abuse 10/06/2016  . Depression 10/06/2016  . Acute left-sided weakness 05/03/2015  . CVA (cerebral infarction) 05/03/2015  . Weakness 11/16/2013  . Chest pain 11/16/2013  . HTN (hypertension) 11/16/2013  . Left-sided weakness 11/16/2013  . Torsades de pointes ([place]) 11/16/2013  . Hemiplegia, unspecified, affecting nondominant side 11/15/2013  . Speech and language deficits 11/15/2013    Past Surgical History:  Procedure Laterality Date  . ANGIOPLASTY    . APPENDECTOMY    . CARDIAC CATHETERIZATION    . CHOLECYSTECTOMY    . WRIST SURGERY Right    age 61    Past Surgical History:  Procedure Laterality Date  . ANGIOPLASTY    . APPENDECTOMY    . CARDIAC CATHETERIZATION     . CHOLECYSTECTOMY    . WRIST SURGERY Right    age 89    Current Outpatient Rx  . Order #: [id number] Class: Historical Med  . Order #: [id number] Class: Normal  . Order #: [id number] Class: Normal  . Order #: [id number] Class: Print  . Order #: [id number] Class: Historical Med  . Order #: [id number] Class: Historical Med  . Order #: [id number] Class: Print  . Order #: [id number] Class: Print  . Order #: [id number] Class: Historical Med  . Order #: [id number] Class: Historical Med  . Order #: [id number] Class: Print  . Order #: [id number] Class: Print  . Order #: [id number] Class: Historical Med  . Order #: [id number] Class: Historical Med  . Order #: [id number] Class: Normal  . Order #: [id number] Class: Normal  . Order #: [id number] Class: Normal    Allergies:  Fluoxetine and Hydrocodone-acetaminophen  Family History: Family History  Problem Relation Age of Onset  . Stroke Father   . Leukemia Father   . Breast cancer Sister     Social History: Social History  Substance Use Topics  . Smoking status: Current Every Day Smoker    Packs/day: 1.00    Types: Cigarettes  . Smokeless tobacco: Never Used  . Alcohol use Yes     Comment: denies being a daily drinker but did have alcohol today     Review of Systems:   10 point review of systems was performed and was otherwise negative:  Constitutional: No fever Eyes: No visual  disturbances ENT: No sore throat, ear pain Cardiac: No chest pain Respiratory: No shortness of breath, wheezing, or stridor Abdomen: No abdominal pain, no vomiting, No diarrhea Endocrine: No weight loss, No night sweats Extremities: No peripheral edema, cyanosis Skin: No rashes, easy bruising Neurologic: No focal weakness, trouble with speech or swollowing Urologic: No dysuria, Hematuria, or urinary frequency   Physical Exam:  ED Triage Vitals  Enc Vitals Group     BP 01/06/17 0933 (!) 160/99     Pulse Rate 01/06/17 0933 85     Resp 01/06/17 0933 (!) 22     Temp  01/06/17 0933 97.8 F (36.6 C)     Temp Source 01/06/17 0933 Oral     SpO2 01/06/17 0933 97 %     Weight 01/06/17 0929 250 lb (113.4 kg)     Height 01/06/17 0929 6' (1.829 m)     Head Circumference --      Peak Flow --      Pain Score --      Pain Loc --      Pain Edu? --      Excl. in [place]? --     General: Awake , Alert , and Oriented times 3; GCS 15 Unkept appearance. Appears anxious with constant movement in the stretcher with no trembling Head: Normal cephalic , atraumatic Eyes: Pupils equal , round, reactive to light Nose/Throat: No nasal drainage, patent upper airway without erythema or exudate.  Neck: Supple, Full range of motion, No anterior adenopathy or palpable thyroid masses Lungs: Clear to ascultation without wheezes , rhonchi, or rales Heart: Regular rate, regular rhythm without murmurs , gallops , or rubs Abdomen: Soft, non tender without rebound, guarding , or rigidity; bowel sounds positive and symmetric in all 4 quadrants. No organomegaly .        Extremities: 2 plus symmetric pulses. No edema, clubbing or cyanosis Neurologic: normal ambulation, Motor symmetric without deficits, sensory intact Skin: warm, dry, no rashes   Labs:   All laboratory work was reviewed including any pertinent negatives or positives listed below:  Labs Reviewed  COMPREHENSIVE METABOLIC PANEL - Abnormal; Notable for the following:       Result Value   AST 70 (*)    ALT 76 (*)    Total Bilirubin 1.3 (*)    All other components within normal limits  URINE DRUG SCREEN, QUALITATIVE ([facility] ONLY) - Abnormal; Notable for the following:    Tricyclic, Ur Screen POSITIVE (*)    Cocaine Metabolite,Ur  POSITIVE (*)    Barbiturates, Ur Screen POSITIVE (*)    All other components within normal limits  ETHANOL  SALICYLATE LEVEL  ACETAMINOPHEN LEVEL  CBC     ED Course:  Patient is currently here voluntarily. Psychiatric and TTS consultation of been established. We will follow their  recommendations. Patient was restarted on some of his medication including lisinopril for hypertension, Seroquel, and was given Ativan for possible cocaine ingestion versus alcohol withdrawal. Clinical Course      Assessment:  Suicidal ideation Polysubstance abuse     Plan: Psychiatric evaluation           Daymon Larsen, MD 01/06/17 1047

## 2017-01-06 NOTE — ED Triage Notes (Signed)
Pt to ed via [facility] with thoughts of SI. Pt here voluntary.

## 2017-01-06 NOTE — Consult Note (Signed)
[place] [facility] Consult   Reason for Consult:  Consult for 56 year old man with a history of alcohol abuse and depression Referring Physician:  McShane Patient Identification: Ian [name] MRN:  [id number] Principal Diagnosis: Severe recurrent major depression without psychotic features [facility]) Diagnosis:   Patient Active Problem List   Diagnosis Date Noted  . Severe recurrent major depression without psychotic features ([place]) [F33.2] 01/06/2017  . Alcohol abuse [F10.10] 10/06/2016  . Substance induced mood disorder ([place]) [F19.94] 10/06/2016  . Cocaine abuse [F14.10] 10/06/2016  . Depression [F32.9] 10/06/2016  . Acute left-sided weakness [M62.89] 05/03/2015  . CVA (cerebral infarction) [I63.9] 05/03/2015  . Weakness [R53.1] 11/16/2013  . Chest pain [R07.9] 11/16/2013  . HTN (hypertension) [I10] 11/16/2013  . Left-sided weakness [R53.1] 11/16/2013  . Torsades de pointes ([place]) [I47.2] 11/16/2013  . Hemiplegia, unspecified, affecting nondominant side [G81.90] 11/15/2013  . Speech and language deficits [F80.9, R47.9] 11/15/2013    Total Time spent with patient: 1 hour  Subjective:   Ian [name] is a 56 y.o. male patient admitted with "I am not doing good".  HPI:  Patient interviewed. Chart reviewed. 56 year old man brought himself to the emergency room saying that he is having active suicidal thoughts. He very dramatically talks several times about how he was going to walk out into traffic and how he is absolutely certain that if we were to release him from the emergency room today he would go and kill himself immediately in the parking lot. Patient says he hasn't had any alcohol in 3-4 days since he was here in the emergency room last time. Notably it looks like that was just 2 days ago. At that time he was not reporting suicidal thoughts and was released to go home. He says that after going home his mood is gotten worse. He hasn't been eating drinking or sleeping for the  last couple days. He's having active suicidal thoughts to walk out into traffic. He has not been taking his Celexa or Seroquel for the last couple days. Patient is a little unclear about why that he is but a review of the chart shows that he has an extremely long QT interval and was taken off his Celexa and Seroquel.  Medical history: History of extended QT interval. Torsades de pointes is listed although I'm not sure that he's ever actually had the full thing. He has a history of Barrett's esophagus and gastric reflux.  Substance abuse history: Long-standing abuse of alcohol and cocaine. No known history of seizure disorder.  Social history: Patient is living by himself. Not working.    Past Psychiatric History: Patient has a history of depression and substance abuse. Past history of self-mutilation. He presented to the emergency room just a couple days ago requesting detox. When that could not be arranged he left and went home. In on medicines in the past including Celexa and Seroquel and was being followed up by [facility]. Recently he was taken off of those because of extended QT interval.  Risk to Self: Is patient at risk for suicide?: Yes Risk to Others:   Prior Inpatient Therapy:   Prior Outpatient Therapy:    Past Medical History:  Past Medical History:  Diagnosis Date  . Anxiety   . Barrett esophagus   . Cancer ([place])   . Coronary artery disease   . Depression   . GERD (gastroesophageal reflux disease)   . Hypertension   . Stroke [facility] 2014   "mini-stroke" per patient    Past  Surgical History:  Procedure Laterality Date  . ANGIOPLASTY    . APPENDECTOMY    . CARDIAC CATHETERIZATION    . CHOLECYSTECTOMY    . WRIST SURGERY Right    age 40   Family History:  Family History  Problem Relation Age of Onset  . Stroke Father   . Leukemia Father   . Breast cancer Sister    Family Psychiatric  History: Family history aunt and uncle with schizophrenia Social History:  History   Alcohol Use  . Yes    Comment: denies being a daily drinker but did have alcohol today     History  Drug Use  . Types: Cocaine, Marijuana    Comment: used cocaine today; denies smoking marijuana for years    Social History   Social History  . Marital status: Single    Spouse name: N/A  . Number of children: N/A  . Years of education: N/A   Social History Main Topics  . Smoking status: Current Every Day Smoker    Packs/day: 1.00    Types: Cigarettes  . Smokeless tobacco: Never Used  . Alcohol use Yes     Comment: denies being a daily drinker but did have alcohol today  . Drug use:     Types: Cocaine, Marijuana     Comment: used cocaine today; denies smoking marijuana for years  . Sexual activity: Not Asked   Other Topics Concern  . None   Social History Narrative  . None   Additional Social History:    Allergies:   Allergies  Allergen Reactions  . Fluoxetine Swelling    Anxiety, itching  . Hydrocodone-Acetaminophen Itching    Labs:  Results for orders placed or performed during the hospital encounter of 01/06/17 (from the past 48 hour(s))  Comprehensive metabolic panel     Status: Abnormal   Collection Time: 01/06/17  9:36 AM  Result Value Ref Range   Sodium 137 135 - 145 mmol/L   Potassium 4.2 3.5 - 5.1 mmol/L   Chloride 104 101 - 111 mmol/L   CO2 24 22 - 32 mmol/L   Glucose, Bld 99 65 - 99 mg/dL   BUN 14 6 - 20 mg/dL   Creatinine, Ser 0.85 0.61 - 1.24 mg/dL   Calcium 8.9 8.9 - 10.3 mg/dL   Total Protein 7.4 6.5 - 8.1 g/dL   Albumin 4.1 3.5 - 5.0 g/dL   AST 70 (H) 15 - 41 U/L   ALT 76 (H) 17 - 63 U/L   Alkaline Phosphatase 69 38 - 126 U/L   Total Bilirubin 1.3 (H) 0.3 - 1.2 mg/dL   GFR calc non Af Amer >60 >60 mL/min   GFR calc Af Amer >60 >60 mL/min    Comment: (NOTE) The eGFR has been calculated using the CKD EPI equation. This calculation has not been validated in all clinical situations. eGFR's persistently <60 mL/min signify possible  Chronic Kidney Disease.    Anion gap 9 5 - 15  Ethanol     Status: None   Collection Time: 01/06/17  9:36 AM  Result Value Ref Range   Alcohol, Ethyl (B) <5 <5 mg/dL    Comment:        LOWEST DETECTABLE LIMIT FOR SERUM ALCOHOL IS 5 mg/dL FOR MEDICAL PURPOSES ONLY   Salicylate level     Status: None   Collection Time: 01/06/17  9:36 AM  Result Value Ref Range   Salicylate Lvl <5.8 2.8 - 30.0 mg/dL  Acetaminophen  level     Status: None   Collection Time: 01/06/17  9:36 AM  Result Value Ref Range   Acetaminophen (Tylenol), Serum 18 10 - 30 ug/mL    Comment:        THERAPEUTIC CONCENTRATIONS VARY SIGNIFICANTLY. A RANGE OF 10-30 ug/mL MAY BE AN EFFECTIVE CONCENTRATION FOR MANY PATIENTS. HOWEVER, SOME ARE BEST TREATED AT CONCENTRATIONS OUTSIDE THIS RANGE. ACETAMINOPHEN CONCENTRATIONS >150 ug/mL AT 4 HOURS AFTER INGESTION AND >50 ug/mL AT 12 HOURS AFTER INGESTION ARE OFTEN ASSOCIATED WITH TOXIC REACTIONS.   cbc     Status: None   Collection Time: 01/06/17  9:36 AM  Result Value Ref Range   WBC 8.3 3.8 - 10.6 K/uL   RBC 5.32 4.40 - 5.90 MIL/uL   Hemoglobin 16.4 13.0 - 18.0 g/dL   HCT 47.3 40.0 - 52.0 %   MCV 88.9 80.0 - 100.0 fL   MCH 30.8 26.0 - 34.0 pg   MCHC 34.7 32.0 - 36.0 g/dL   RDW 13.4 11.5 - 14.5 %   Platelets 232 150 - 440 K/uL  Urine Drug Screen, Qualitative     Status: Abnormal   Collection Time: 01/06/17  9:36 AM  Result Value Ref Range   Tricyclic, Ur Screen POSITIVE (A) NONE DETECTED   Amphetamines, Ur Screen NONE DETECTED NONE DETECTED   MDMA (Ecstasy)Ur Screen NONE DETECTED NONE DETECTED   Cocaine Metabolite,Ur [place] POSITIVE (A) NONE DETECTED   Opiate, Ur Screen NONE DETECTED NONE DETECTED   Phencyclidine (PCP) Ur S NONE DETECTED NONE DETECTED   Cannabinoid 50 Ng, Ur [place] NONE DETECTED NONE DETECTED   Barbiturates, Ur Screen POSITIVE (A) NONE DETECTED   Benzodiazepine, Ur Scrn NONE DETECTED NONE DETECTED   Methadone Scn, Ur NONE DETECTED NONE DETECTED     Comment: (NOTE) 099  Tricyclics, urine               Cutoff 1000 ng/mL 200  Amphetamines, urine             Cutoff 1000 ng/mL 300  MDMA (Ecstasy), urine           Cutoff 500 ng/mL 400  Cocaine Metabolite, urine       Cutoff 300 ng/mL 500  Opiate, urine                   Cutoff 300 ng/mL 600  Phencyclidine (PCP), urine      Cutoff 25 ng/mL 700  Cannabinoid, urine              Cutoff 50 ng/mL 800  Barbiturates, urine             Cutoff 200 ng/mL 900  Benzodiazepine, urine           Cutoff 200 ng/mL 1000 Methadone, urine                Cutoff 300 ng/mL 1100 1200 The urine drug screen provides only a preliminary, unconfirmed 1300 analytical test result and should not be used for non-medical 1400 purposes. Clinical consideration and professional judgment should 1500 be applied to any positive drug screen result due to possible 1600 interfering substances. A more specific alternate chemical method 1700 must be used in order to obtain a confirmed analytical result.  1800 Gas chromato graphy / mass spectrometry (GC/MS) is the preferred 1900 confirmatory method.     Current Facility-Administered Medications  Medication Dose Route Frequency Provider Last Rate Last Dose  . atorvastatin (LIPITOR) tablet 40 mg  40 mg  Oral q1800 [name], MD      . clonazePAM Bobbye [place]) tablet 0.5 mg  0.5 mg Oral BID [name], MD       Current Outpatient Prescriptions  Medication Sig Dispense Refill  . acetaminophen-codeine (TYLENOL #3) 300-30 MG tablet Take 1 tablet by mouth every 4 (four) hours as needed for pain.    Marland Kitchen aspirin EC 81 MG EC tablet Take 1 tablet (81 mg total) by mouth daily. (Patient not taking: Reported on 01/04/2017) 30 tablet 0  . atorvastatin (LIPITOR) 40 MG tablet Take 1 tablet (40 mg total) by mouth daily at 6 PM. 30 tablet 0  . calcium carbonate (TUMS) 500 MG chewable tablet Chew 2 tablets (400 mg of elemental calcium total) by mouth 3 (three) times daily as needed for  indigestion or heartburn. 90 tablet 0  . citalopram (CELEXA) 20 MG tablet Take 20 mg by mouth at bedtime.  0  . clonazePAM (KLONOPIN) 0.5 MG tablet Take 0.5 mg by mouth 2 (two) times daily as needed for anxiety.     . famotidine (PEPCID) 20 MG tablet Take 1 tablet (20 mg total) by mouth 2 (two) times daily. 60 tablet 0  . gabapentin (NEURONTIN) 100 MG capsule Take 1 capsule (100 mg total) by mouth 3 (three) times daily. 90 capsule 2  . hydrOXYzine (ATARAX/VISTARIL) 50 MG tablet Take 1 tablet by mouth 2 (two) times daily as needed.  0  . lisinopril (PRINIVIL,ZESTRIL) 10 MG tablet Take 10 mg by mouth daily.  0  . ondansetron (ZOFRAN) 4 MG tablet Take 1 tablet (4 mg total) by mouth daily as needed for nausea or vomiting. (Patient not taking: Reported on 09/01/2016) 10 tablet 0  . oxyCODONE-acetaminophen (ROXICET) 5-325 MG per tablet Take 1 tablet by mouth every 6 (six) hours as needed. (Patient not taking: Reported on 09/01/2016) 12 tablet 0  . pantoprazole (PROTONIX) 40 MG tablet Take 40 mg by mouth daily.    Marland Kitchen PROAIR HFA 108 (90 BASE) MCG/ACT inhaler Inhale 1-2 puffs into the lungs every 6 (six) hours as needed for wheezing or shortness of breath.   0  . QUEtiapine (SEROQUEL) 300 MG tablet Take 1 tablet (300 mg total) by mouth at bedtime. (Patient taking differently: Take 400 mg by mouth at bedtime. ) 30 tablet 0  . QUEtiapine (SEROQUEL) 50 MG tablet Take 1 tablet (50 mg total) by mouth daily at 12 noon. (Patient taking differently: Take 50 mg by mouth 2 (two) times daily. ) 30 tablet 0  . thiamine 50 MG tablet Take 1 tablet (50 mg total) by mouth daily. (Patient not taking: Reported on 01/04/2017) 30 tablet 0    Musculoskeletal: Strength & Muscle Tone: decreased Gait & Station: unsteady Patient leans: N/A  Psychiatric Specialty Exam: Physical Exam  Nursing note and vitals reviewed. Constitutional: He appears well-developed. He appears distressed.  HENT:  Head: Normocephalic and atraumatic.   Eyes: Conjunctivae are normal. Pupils are equal, round, and reactive to light.  Neck: Normal range of motion.  Cardiovascular: Regular rhythm and normal heart sounds.   Respiratory: Effort normal. No respiratory distress.  GI: Soft.  Musculoskeletal: Normal range of motion.  Neurological: He is alert.  Skin: Skin is warm and dry.  Psychiatric: His mood appears anxious. His affect is angry and labile. His speech is tangential. He is agitated. Thought content is paranoid. He expresses impulsivity. He expresses suicidal ideation. He exhibits abnormal recent memory.    Review of Systems  HENT: Negative.  Eyes: Negative.   Respiratory: Negative.   Cardiovascular: Negative.   Gastrointestinal: Positive for nausea.  Musculoskeletal: Negative.   Skin: Negative.   Neurological: Positive for tremors and weakness.  Psychiatric/Behavioral: Positive for depression, hallucinations and suicidal ideas. The patient is nervous/anxious and has insomnia.     Blood pressure (!) 152/91, pulse 89, temperature 97.9 F (36.6 C), temperature source Oral, resp. rate 20, height 6' (1.829 m), weight 113.4 kg (250 lb), SpO2 98 %.Body mass index is 33.91 kg/m.  General Appearance: Disheveled  Eye Contact:  Minimal  Speech:  Garbled  Volume:  Decreased  Mood:  Anxious and Depressed  Affect:  Labile  Thought Process:  Disorganized  Orientation:  Full (Time, Place, and Person)  Thought Content:  Tangential  Suicidal Thoughts:  Yes.  with intent/plan  Homicidal Thoughts:  No  Memory:  Immediate;   Good Recent;   Fair Remote;   Fair  Judgement:  Fair  Insight:  Fair  Psychomotor Activity:  Decreased  Concentration:  Concentration: Fair  Recall:  [employer] of Knowledge:  Fair  Language:  Fair  Akathisia:  No  Handed:  Right  AIMS (if indicated):     Assets:  Communication Skills Desire for Improvement Housing Resilience  ADL's:  Impaired  Cognition:  Impaired,  Mild  Sleep:        Treatment  Plan Summary: Daily contact with patient to assess and evaluate symptoms and progress in treatment, Medication management and Plan 56 year old man is not acutely intoxicated but does seem to be still having alcohol withdrawal. He is reporting suicidal ideation and is very dramatic about it. He is off his medicine and he doesn't understand why but it looks like he was taken off because of his EKG. Because of suicidal ideation he will be admitted to the inpatient unit. I'm going to leave him off of the Seroquel and Celexa. Detox medication in place. Supportive counseling completed. Recheck EKG and labs.  Disposition: Recommend psychiatric Inpatient admission when medically cleared. Supportive therapy provided about ongoing stressors.  Alethia Berthold, MD 01/06/2017 4:24 PM

## 2017-01-06 NOTE — BH Assessment (Addendum)
Patient is to be admitted to [facility] by Dr. Weber Cooks.  Attending Physician will be Dr. Bary Leriche.   Patient has been assigned to room 325, by [facility] Charge Nurse Anderson Malta.   Intake Paper Work has been signed and placed on patient chart.  ER staff is aware of the admission Holley Raring, ER Sect.; Dr. Kerman Passey, ER MD; Tana Conch, [place], Patient Access).
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
## 2017-01-07 DIAGNOSIS — E785 Hyperlipidemia, unspecified: Secondary | ICD-10-CM | POA: Diagnosis present

## 2017-01-07 DIAGNOSIS — F3130 Bipolar disorder, current episode depressed, mild or moderate severity, unspecified: Secondary | ICD-10-CM

## 2017-01-07 DIAGNOSIS — K2270 Barrett's esophagus without dysplasia: Secondary | ICD-10-CM | POA: Diagnosis present

## 2017-01-07 LAB — LIPID PANEL
CHOL/HDL RATIO: 4.3 ratio
Cholesterol: 153 mg/dL (ref 0–200)
HDL: 36 mg/dL — ABNORMAL LOW (ref >40)
LDL CALC: 86 mg/dL (ref 0–99)
Triglycerides: 155 mg/dL — ABNORMAL HIGH (ref <150)
VLDL: 31 mg/dL (ref 0–40)

## 2017-01-07 LAB — TSH: TSH: 2.304 u[IU]/mL (ref 0.350–4.500)

## 2017-01-07 MED ORDER — BUPROPION HCL ER (XL) 150 MG PO TB24
150.0000 mg | ORAL_TABLET | Freq: Every day | ORAL | Status: DC
  Administered 2017-01-08 – 2017-01-13 (×6): 150 mg via ORAL
  Filled 2017-01-07 (×6): qty 1

## 2017-01-07 MED ORDER — ROSUVASTATIN CALCIUM 10 MG PO TABS
20.0000 mg | ORAL_TABLET | Freq: Every day | ORAL | Status: DC
  Administered 2017-01-07: 20 mg via ORAL
  Filled 2017-01-07: qty 2

## 2017-01-07 MED ORDER — QUETIAPINE FUMARATE ER 50 MG PO TB24
50.0000 mg | ORAL_TABLET | ORAL | Status: DC
  Administered 2017-01-08 – 2017-01-12 (×5): 50 mg via ORAL
  Filled 2017-01-07 (×5): qty 1

## 2017-01-07 MED ORDER — ASPIRIN EC 81 MG PO TBEC
81.0000 mg | DELAYED_RELEASE_TABLET | Freq: Every day | ORAL | Status: DC
  Administered 2017-01-07 – 2017-01-13 (×7): 81 mg via ORAL
  Filled 2017-01-07 (×7): qty 1

## 2017-01-07 MED ORDER — CITALOPRAM HYDROBROMIDE 20 MG PO TABS
20.0000 mg | ORAL_TABLET | Freq: Every day | ORAL | Status: DC
  Administered 2017-01-07: 20 mg via ORAL
  Filled 2017-01-07: qty 1

## 2017-01-07 MED ORDER — ALBUTEROL SULFATE HFA 108 (90 BASE) MCG/ACT IN AERS
1.0000 | INHALATION_SPRAY | Freq: Four times a day (QID) | RESPIRATORY_TRACT | Status: DC | PRN
  Administered 2017-01-07 – 2017-01-12 (×7): 1 via RESPIRATORY_TRACT
  Filled 2017-01-07 (×2): qty 6.7

## 2017-01-07 MED ORDER — QUETIAPINE FUMARATE ER 50 MG PO TB24
50.0000 mg | ORAL_TABLET | Freq: Every day | ORAL | Status: DC
  Administered 2017-01-07 – 2017-01-11 (×5): 50 mg via ORAL
  Filled 2017-01-07 (×5): qty 1

## 2017-01-07 MED ORDER — QUETIAPINE FUMARATE ER 50 MG PO TB24
200.0000 mg | ORAL_TABLET | Freq: Every day | ORAL | Status: DC
  Administered 2017-01-07 – 2017-01-08 (×2): 200 mg via ORAL
  Filled 2017-01-07 (×2): qty 4

## 2017-01-07 MED ORDER — ATORVASTATIN CALCIUM 20 MG PO TABS
40.0000 mg | ORAL_TABLET | Freq: Every day | ORAL | Status: DC
  Administered 2017-01-08 – 2017-01-12 (×5): 40 mg via ORAL
  Filled 2017-01-07 (×7): qty 2

## 2017-01-07 MED ORDER — CHLORDIAZEPOXIDE HCL 10 MG PO CAPS
10.0000 mg | ORAL_CAPSULE | Freq: Three times a day (TID) | ORAL | Status: AC
  Administered 2017-01-07 – 2017-01-10 (×9): 10 mg via ORAL
  Filled 2017-01-07 (×9): qty 1

## 2017-01-07 MED ORDER — LISINOPRIL 10 MG PO TABS
10.0000 mg | ORAL_TABLET | Freq: Every day | ORAL | Status: DC
  Administered 2017-01-07 – 2017-01-13 (×7): 10 mg via ORAL
  Filled 2017-01-07 (×7): qty 1

## 2017-01-07 NOTE — BHH Group Notes (Signed)
[place] Group Notes:  (Nursing/MHT/Case Management/Adjunct)  Date:  01/07/2017  Time:  4:36 PM  Type of Therapy:  Psychoeducational Skills  Participation Level:  Active  Participation Quality:  Appropriate, Attentive, Sharing and Supportive  Affect:  Appropriate  Cognitive:  Appropriate  Insight:  Appropriate  Engagement in Group:  Engaged and Supportive  Modes of Intervention:  Discussion and Education  Summary of Progress/Problems:  Ian [name] 01/07/2017, 4:36 PM

## 2017-01-07 NOTE — Progress Notes (Signed)
Pt much more cooperative after medications ordered and administered. Reports, "I feel like a new person now." Safety maintained. Will continue to monitor.

## 2017-01-07 NOTE — Plan of Care (Signed)
Problem: Activity: Goal: Sleeping patterns will improve Outcome: Progressing Patient slept for Estimated Hours of 5.30; 15 minutes safety round maintained, no injury or falls during this shift.
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
## 2017-01-07 NOTE — H&P (Signed)
Psychiatric Admission Assessment Adult  Patient Identification: Ian [name] MRN:  [id number] Date of Evaluation:  01/07/2017 Chief Complaint:  Depression Principal Diagnosis: Bipolar I disorder, most recent episode depressed with anxious distress (Ian [name]) Diagnosis:   Patient Active Problem List   Diagnosis Date Noted  . Dyslipidemia [E78.5] 01/07/2017  . Barrett's esophagus [K22.70] 01/07/2017  . Bipolar I disorder, most recent episode depressed with anxious distress (Ian [name]) [F31.30] 01/06/2017  . Cannabis use disorder, moderate, dependence (Ian [name]) [F12.20] 01/06/2017  . Tobacco use disorder [F17.200] 01/06/2017  . Alcohol use disorder, moderate, dependence (Ian [name]) [F10.20] 10/06/2016  . Substance induced mood disorder (Ian [name]) [F19.94] 10/06/2016  . Cocaine use disorder, moderate, dependence (Ian [name]) [F14.20] 10/06/2016  . Depression [F32.9] 10/06/2016  . Acute left-sided weakness [M62.89] 05/03/2015  . CVA (cerebral infarction) [I63.9] 05/03/2015  . Weakness [R53.1] 11/16/2013  . Chest pain [R07.9] 11/16/2013  . HTN (hypertension) [I10] 11/16/2013  . Left-sided weakness [R53.1] 11/16/2013  . Torsades de pointes (Ian [name]) [I47.2] 11/16/2013  . Hemiplegia, unspecified, affecting nondominant side [G81.90] 11/15/2013  . Speech and language deficits [F80.9, R47.9] 11/15/2013   History of Present Illness:   Identifying data. Ian [name] is a 56 year Ian male with a history of severe depression, mood instability, anxiety, and alcohol abuse.  Chief complaint. "I am freaking out."  History of present illness. Information was obtained from the patient and the chart. The patient has a long history of mental illness and is disabled from. He has been maintained successfully for the past 5 years on a combination of Lexapro, Seroquel, and clonazepam. He follows up with [name] and has been compliant with medications but they recently started tapering off his clonazepam. The patient has been also drinking some  but not heavily with his last drink on Friday over the holidays the patient became increasingly depressed for sleep, decreased appetite, anhedonia, being of guilt and hopelessness worthlessness, poor energy and concentration, social isolation, and crying spells. He started thinking of suicide. His anxiety went through the roof. His mind is racing and he feels shaky and sweaty all the time and wants to die. He also started worrying about Barrett esophagus and convince himself that he is gravely ill. He went to the emergency room few days ago. He denies psychotic symptoms or symptoms suggestive of bipolar mania. He reports severe social anxiety and frequent panic attacks he denies PTSD symptoms. He is a Ship broker. He has been drinking alcohol and smoking marijuana but decided to stop.  Past psychiatric history. As there were 3 or 4 prior psychiatric hospital patient's [name] for bipolar and substance abuse. 5 years ago he was referred to [name] where he was hospitalized for a month, put on the right medications, and treated for alcoholism. His drinking has never been as bad as before since. He admits to suicide attempts in the past.  Family psychiatric history. None reported.  Social history. He is disabled from mental illness he lives in law alone in her dilapidated house. He is a Ship broker. He has never been married and has no children. He is estranged from his family.  Total Time spent with patient: 1 hour  Is the patient at risk to self? Yes.    Has the patient been a risk to self in the past 6 months? No.  Has the patient been a risk to self within the distant past? Yes.    Is the patient a risk to others? No.  Has the patient been a risk to others in the  past 6 months? No.  Has the patient been a risk to others within the distant past? No.   Prior Inpatient Therapy:   Prior Outpatient Therapy:    Alcohol Screening: 1. How often do you have a drink containing alcohol?: 2  to 4 times a month 2. How many drinks containing alcohol do you have on a typical day when you are drinking?: 3 or 4 3. How often do you have six or more drinks on one occasion?: Less than monthly Preliminary Score: 2 4. How often during the last year have you found that you were not able to stop drinking once you had started?: Never 5. How often during the last year have you failed to do what was normally expected from you becasue of drinking?: Never 6. How often during the last year have you needed a first drink in the morning to get yourself going after a heavy drinking session?: Never 7. How often during the last year have you had a feeling of guilt of remorse after drinking?: Never 8. How often during the last year have you been unable to remember what happened the night before because you had been drinking?: Never 9. Have you or someone else been injured as a result of your drinking?: No 10. Has a relative or friend or a doctor or another health worker been concerned about your drinking or suggested you cut down?: No Alcohol Use Disorder Identification Test Final Score (AUDIT): 4 Brief Intervention: AUDIT score less than 7 or less-screening does not suggest unhealthy drinking-brief intervention not indicated Substance Abuse History in the last 12 months:  Yes.   Consequences of Substance Abuse: Negative Previous Psychotropic Medications: No  Psychological Evaluations: No  Past Medical History:  Past Medical History:  Diagnosis Date  . Anxiety   . Barrett esophagus   . Cancer ([place])   . Coronary artery disease   . Depression   . GERD (gastroesophageal reflux disease)   . Hypertension   . Stroke [facility]) 2014   "mini-stroke" per patient    Past Surgical History:  Procedure Laterality Date  . ANGIOPLASTY    . APPENDECTOMY    . CARDIAC CATHETERIZATION    . CHOLECYSTECTOMY    . WRIST SURGERY Right    age 41   Family History:  Family History  Problem Relation Age of Onset  .  Stroke Father   . Leukemia Father   . Breast cancer Sister     Tobacco Screening: Have you used any form of tobacco in the last 30 days? (Cigarettes, Smokeless Tobacco, Cigars, and/or Pipes): Yes Tobacco use, Select all that apply: 5 or more cigarettes per day, cigar use daily Are you interested in Tobacco Cessation Medications?: Yes, will notify MD for an order Counseled patient on smoking cessation including recognizing danger situations, developing coping skills and basic information about quitting provided: Yes (Nicotine Patch recommended) Social History:  History  Alcohol Use  . Yes    Comment: denies being a daily drinker but did have alcohol today     History  Drug Use  . Types: Cocaine, Marijuana    Comment: used cocaine today; denies smoking marijuana for years    Additional Social History:                           Allergies:   Allergies  Allergen Reactions  . Fluoxetine Swelling    Anxiety, itching  . Hydrocodone-Acetaminophen Itching   Lab Results:  Results for orders placed or performed during the hospital encounter of 01/06/17 (from the past 48 hour(s))  Lipid panel     Status: Abnormal   Collection Time: 01/07/17  7:33 AM  Result Value Ref Range   Cholesterol 153 0 - 200 mg/dL   Triglycerides 155 (H) <150 mg/dL   HDL 36 (L) >40 mg/dL   Total CHOL/HDL Ratio 4.3 RATIO   VLDL 31 0 - 40 mg/dL   LDL Cholesterol 86 0 - 99 mg/dL    Comment:        Total Cholesterol/HDL:CHD Risk Coronary Heart Disease Risk Table                     Men   Women  1/2 Average Risk   3.4   3.3  Average Risk       5.0   4.4  2 X Average Risk   9.6   7.1  3 X Average Risk  23.4   11.0        Use the calculated Patient Ratio above and the CHD Risk Table to determine the patient's CHD Risk.        ATP III CLASSIFICATION (LDL):  <100     mg/dL   Optimal  100-129  mg/dL   Near or Above                    Optimal  130-159  mg/dL   Borderline  160-189  mg/dL   High   >190     mg/dL   Very High   TSH     Status: None   Collection Time: 01/07/17  7:33 AM  Result Value Ref Range   TSH 2.304 0.350 - 4.500 uIU/mL    Comment: Performed by a 3rd Generation assay with a functional sensitivity of <=0.01 uIU/mL.    Blood Alcohol level:  Lab Results  Component Value Date   ETH <5 01/06/2017   ETH 111 (H) 123456    Metabolic Disorder Labs:  Lab Results  Component Value Date   HGBA1C 5.3 09/02/2016   MPG 103 11/16/2013   No results found for: PROLACTIN Lab Results  Component Value Date   CHOL 153 01/07/2017   TRIG 155 (H) 01/07/2017   HDL 36 (L) 01/07/2017   CHOLHDL 4.3 01/07/2017   VLDL 31 01/07/2017   LDLCALC 86 01/07/2017   LDLCALC 75 09/02/2016    Current Medications: Current Facility-Administered Medications  Medication Dose Route Frequency Provider Last Rate Last Dose  . acetaminophen (TYLENOL) tablet 650 mg  650 mg Oral Q6H PRN [name], MD      . alum & mag hydroxide-simeth (MAALOX/MYLANTA) 200-200-20 MG/5ML suspension 30 mL  30 mL Oral Q4H PRN [name], MD      . aspirin EC tablet 81 mg  81 mg Oral Daily Brance Dartt B Brittain Smithey, MD   81 mg at 01/07/17 1258  . chlordiazePOXIDE (LIBRIUM) capsule 10 mg  10 mg Oral TID Clovis Fredrickson, MD   10 mg at 01/07/17 1258  . citalopram (CELEXA) tablet 20 mg  20 mg Oral Daily Karren Newland B Aul Mangieri, MD   20 mg at 01/07/17 1258  . lisinopril (PRINIVIL,ZESTRIL) tablet 10 mg  10 mg Oral Daily Maizee Reinhold B Winter Trefz, MD      . magnesium hydroxide (MILK OF MAGNESIA) suspension 30 mL  30 mL Oral Daily PRN [name], MD      . nicotine (NICODERM CQ -  dosed in mg/24 hours) patch 21 mg  21 mg Transdermal Daily Clovis Fredrickson, MD   21 mg at 01/07/17 0807  . QUEtiapine (SEROQUEL XR) 24 hr tablet 200 mg  200 mg Oral QHS Dev Dhondt B Brissia Delisa, MD      . [name] ON 01/08/2017] QUEtiapine (SEROQUEL XR) 24 hr tablet 50 mg  50 mg Oral BH-q7a Brylan Seubert B Dorleen Kissel, MD      . QUEtiapine (SEROQUEL  XR) 24 hr tablet 50 mg  50 mg Oral QAC lunch Hannie Shoe B Sumayah Bearse, MD   50 mg at 01/07/17 1257  . rosuvastatin (CRESTOR) tablet 20 mg  20 mg Oral q1800 Alveria Mcglaughlin B Wanita Derenzo, MD       PTA Medications: Prescriptions Prior to Admission  Medication Sig Dispense Refill Last Dose  . acetaminophen-codeine (TYLENOL #3) 300-30 MG tablet Take 1 tablet by mouth every 4 (four) hours as needed for pain.   prn at prn  . aspirin EC 81 MG EC tablet Take 1 tablet (81 mg total) by mouth daily. (Patient not taking: Reported on 01/04/2017) 30 tablet 0 Not Taking at Unknown time  . atorvastatin (LIPITOR) 40 MG tablet Take 1 tablet (40 mg total) by mouth daily at 6 PM. 30 tablet 0   . calcium carbonate (TUMS) 500 MG chewable tablet Chew 2 tablets (400 mg of elemental calcium total) by mouth 3 (three) times daily as needed for indigestion or heartburn. 90 tablet 0   . citalopram (CELEXA) 20 MG tablet Take 20 mg by mouth at bedtime.  0 01/03/2017 at Unknown time  . clonazePAM (KLONOPIN) 0.5 MG tablet Take 0.5 mg by mouth 2 (two) times daily as needed for anxiety.    prn at prn  . famotidine (PEPCID) 20 MG tablet Take 1 tablet (20 mg total) by mouth 2 (two) times daily. 60 tablet 0   . gabapentin (NEURONTIN) 100 MG capsule Take 1 capsule (100 mg total) by mouth 3 (three) times daily. 90 capsule 2   . hydrOXYzine (ATARAX/VISTARIL) 50 MG tablet Take 1 tablet by mouth 2 (two) times daily as needed.  0 prn at prn  . lisinopril (PRINIVIL,ZESTRIL) 10 MG tablet Take 10 mg by mouth daily.  0 Past Month at Unknown time  . ondansetron (ZOFRAN) 4 MG tablet Take 1 tablet (4 mg total) by mouth daily as needed for nausea or vomiting. (Patient not taking: Reported on 09/01/2016) 10 tablet 0 Not Taking at Unknown time  . oxyCODONE-acetaminophen (ROXICET) 5-325 MG per tablet Take 1 tablet by mouth every 6 (six) hours as needed. (Patient not taking: Reported on 09/01/2016) 12 tablet 0 Not Taking at Unknown time  . pantoprazole (PROTONIX) 40 MG  tablet Take 40 mg by mouth daily.   Past Month at Unknown time  . PROAIR HFA 108 (90 BASE) MCG/ACT inhaler Inhale 1-2 puffs into the lungs every 6 (six) hours as needed for wheezing or shortness of breath.   0 prn at prn  . QUEtiapine (SEROQUEL) 300 MG tablet Take 1 tablet (300 mg total) by mouth at bedtime. (Patient taking differently: Take 400 mg by mouth at bedtime. ) 30 tablet 0 01/03/2017 at Unknown time  . QUEtiapine (SEROQUEL) 50 MG tablet Take 1 tablet (50 mg total) by mouth daily at 12 noon. (Patient taking differently: Take 50 mg by mouth 2 (two) times daily. ) 30 tablet 0 prn at prn  . thiamine 50 MG tablet Take 1 tablet (50 mg total) by mouth daily. 30 tablet 0 Not Taking  at Unknown time    Musculoskeletal: Strength & Muscle Tone: within normal limits Gait & Station: normal Patient leans: N/A  Psychiatric Specialty Exam: I reviewed physical examination performed in the emergency room and agree with the findings. Physical Exam  Nursing note and vitals reviewed. Psychiatric: His speech is normal. His mood appears anxious. His affect is labile. He is agitated. Cognition and memory are normal. He expresses impulsivity. He exhibits a depressed mood. He expresses suicidal ideation.    ROS  Blood pressure (!) 147/110, pulse 80, temperature 97.7 F (36.5 C), temperature source Oral, resp. rate 18, height 6' (1.829 m), weight 109.8 kg (242 lb), SpO2 98 %.Body mass index is 32.82 kg/m.  See SRA.                                                  Sleep:  Number of Hours: 5.3    Treatment Plan Summary: Daily contact with patient to assess and evaluate symptoms and progress in treatment and Medication management   Mr. Modglin is a 56 year Ian male with history of depression, anxiety, mood instability and alcoholism admitted for suicidal ideation and heightened anxiety in the context of medication changes and discontinuation of alcohol.  1. Suicidal ideation. The  patient is able to contract for safety in the hospital.  2. Mood. For the past 5 years the patient has been maintained on a combination of Seroquel, Celexa, and clonazepam. Clonazepam has been gradually discontinued by his primary psychiatrist. The patient has a history of torsades in 2014. His QTC this morning was 446 but it was 588 just 2 days ago. In reviewing his older EKGs I see that his QTC is usually around 440. The patient is demanding to put him back on Seroquel. He understands the risks. I will start Seroquel 50 mg at breakfast and lunch and 200 mg at bedtime. I will also restart Celexa who may be the culprit as well. I will order daily EKG. We will may need cardiology consultation.  3. Alcoholism. He stopped drinking 2 days ago. We will start low-dose Librium.  4. Substance abuse treatment. The patient refuses long-term treatment. He will follow up with [facility].  5. Hypertension. He is on lisinopril.  6. Dyslipidemia. He is on Crestor.  7. Barrett's esophagitis. He is on Protonix.   8. Disposition. He will be discharged to home. He will follow-up with [facility].   Observation Level/Precautions:  15 minute checks  Laboratory:  CBC Chemistry Profile UDS UA  Psychotherapy:    Medications:    Consultations:    Discharge Concerns:    Estimated LOS:  Other:     Physician Treatment Plan for Primary Diagnosis: Bipolar I disorder, most recent episode depressed with anxious distress ([place]) Long Term Goal(s): Improvement in symptoms so as ready for discharge  Short Term Goals: Ability to identify changes in lifestyle to reduce recurrence of condition will improve, Ability to verbalize feelings will improve, Ability to disclose and discuss suicidal ideas, Ability to demonstrate self-control will improve, Ability to identify and develop effective coping behaviors will improve, Ability to maintain clinical measurements within normal limits will improve, Compliance with prescribed  medications will improve and Ability to identify triggers associated with substance abuse/mental health issues will improve  Physician Treatment Plan for Secondary Diagnosis: Principal Problem:   Bipolar I disorder, most recent episode depressed with  anxious distress ([place]) Active Problems:   HTN (hypertension)   Alcohol use disorder, moderate, dependence ([facility])   Cannabis use disorder, moderate, dependence ([facility])   Tobacco use disorder   Dyslipidemia   Barrett's esophagus  Long Term Goal(s): Improvement in symptoms so as ready for discharge  Short Term Goals: Ability to identify changes in lifestyle to reduce recurrence of condition will improve, Ability to demonstrate self-control will improve and Ability to identify triggers associated with substance abuse/mental health issues will improve  I certify that inpatient services furnished can reasonably be expected to improve the patient's condition.    Orson Slick, MD 1/17/20181:16 PM

## 2017-01-07 NOTE — [facility] Suicide Risk Assessment (Signed)
[facility] Admission Suicide Risk Assessment   Nursing information obtained from:  Patient, Review of record Demographic factors:  Caucasian, Low socioeconomic status, Living alone, Unemployed Current Mental Status:  Suicide plan, Intention to act on suicide plan, Belief that plan would result in death Loss Factors:  Decline in physical health, Financial problems / change in socioeconomic status Historical Factors:  Impulsivity Risk Reduction Factors:  Positive therapeutic relationship  Total Time spent with patient: 1 hour Principal Problem: Bipolar I disorder, most recent episode depressed with anxious distress ([place]) Diagnosis:   Patient Active Problem List   Diagnosis Date Noted  . Dyslipidemia [E78.5] 01/07/2017  . Barrett's esophagus [K22.70] 01/07/2017  . Bipolar I disorder, most recent episode depressed with anxious distress ([place]) [F31.30] 01/06/2017  . Cannabis use disorder, moderate, dependence ([place]) [F12.20] 01/06/2017  . Tobacco use disorder [F17.200] 01/06/2017  . Alcohol use disorder, moderate, dependence ([place]) [F10.20] 10/06/2016  . Substance induced mood disorder ([place]) [F19.94] 10/06/2016  . Cocaine use disorder, moderate, dependence ([place]) [F14.20] 10/06/2016  . Depression [F32.9] 10/06/2016  . Acute left-sided weakness [M62.89] 05/03/2015  . CVA (cerebral infarction) [I63.9] 05/03/2015  . Weakness [R53.1] 11/16/2013  . Chest pain [R07.9] 11/16/2013  . HTN (hypertension) [I10] 11/16/2013  . Left-sided weakness [R53.1] 11/16/2013  . Torsades de pointes ([place]) [I47.2] 11/16/2013  . Hemiplegia, unspecified, affecting nondominant side [G81.90] 11/15/2013  . Speech and language deficits [F80.9, R47.9] 11/15/2013   Subjective Data: suicidal ideation.  Continued Clinical Symptoms:  Alcohol Use Disorder Identification Test Final Score (AUDIT): 4 The "Alcohol Use Disorders Identification Test", Guidelines for Use in Primary Care, Second Edition.  World Pharmacologist  [facility]). Score between 0-7:  no or low risk or alcohol related problems. Score between 8-15:  moderate risk of alcohol related problems. Score between 16-19:  high risk of alcohol related problems. Score 20 or above:  warrants further diagnostic evaluation for alcohol dependence and treatment.   CLINICAL FACTORS:   Severe Anxiety and/or Agitation Bipolar Disorder:   Depressive phase Depression:   Comorbid alcohol abuse/dependence Impulsivity Insomnia Alcohol/Substance Abuse/Dependencies   Musculoskeletal: Strength & Muscle Tone: within normal limits Gait & Station: normal Patient leans: N/A  Psychiatric Specialty Exam: Physical Exam  Nursing note and vitals reviewed.   Review of Systems  Cardiovascular: Positive for chest pain.  Gastrointestinal: Positive for abdominal pain.  Psychiatric/Behavioral: Positive for depression, substance abuse and suicidal ideas. The patient is nervous/anxious and has insomnia.   All other systems reviewed and are negative.   Blood pressure (!) 147/110, pulse 80, temperature 97.7 F (36.5 C), temperature source Oral, resp. rate 18, height 6' (1.829 m), weight 109.8 kg (242 lb), SpO2 98 %.Body mass index is 32.82 kg/m.  General Appearance: Casual  Eye Contact:  Good  Speech:  Clear and Coherent  Volume:  Normal  Mood:  Anxious, Depressed, Hopeless and Worthless  Affect:  Labile and Tearful  Thought Process:  Goal Directed and Descriptions of Associations: Intact  Orientation:  Full (Time, Place, and Person)  Thought Content:  WDL and Obsessions  Suicidal Thoughts:  Yes.  with intent/plan  Homicidal Thoughts:  No  Memory:  Immediate;   Fair Recent;   Fair Remote;   Fair  Judgement:  Impaired  Insight:  Shallow  Psychomotor Activity:  Increased  Concentration:  Concentration: Fair and Attention Span: Fair  Recall:  [employer] of Knowledge:  Fair  Language:  Fair  Akathisia:  No  Handed:  Right  AIMS (if indicated):  Assets:   Communication Skills Desire for Improvement Financial Resources/Insurance Housing Resilience  ADL's:  Intact  Cognition:  WNL  Sleep:  Number of Hours: 5.3      COGNITIVE FEATURES THAT CONTRIBUTE TO RISK:  None    SUICIDE RISK:   Severe:  Frequent, intense, and enduring suicidal ideation, specific plan, no subjective intent, but some objective markers of intent (i.e., choice of lethal method), the method is accessible, some limited preparatory behavior, evidence of impaired self-control, severe dysphoria/symptomatology, multiple risk factors present, and few if any protective factors, particularly a lack of social support.   PLAN OF CARE: Hospital admission, medication management, substance abuse counseling, discharge planning.  Mr. Hardwell is a 56 year old male with history of depression, anxiety, mood instability and alcoholism admitted for suicidal ideation and heightened anxiety in the context of medication changes and discontinuation of alcohol.  1. Suicidal ideation. The patient is able to contract for safety in the hospital.  2. Mood. For the past 5 years the patient has been maintained on a combination of Seroquel, Celexa, and clonazepam. Clonazepam has been gradually discontinued by his primary psychiatrist. The patient has a history of torsades in 2014. His QTC this morning was 446 but it was 588 just 2 days ago. In reviewing his older EKGs I see that his QTC is usually around 440. The patient is demanding to put him back on Seroquel. He understands the risks. I will start Seroquel 50 mg at breakfast and lunch and 200 mg at bedtime. I will also restart Celexa who may be the culprit as well. I will order daily EKG. We will may need cardiology consultation.  3. Alcoholism. He stopped drinking 2 days ago. We will start low-dose Librium.  4. Substance abuse treatment. The patient refuses long-term treatment. He will follow up with [facility].  5. Hypertension. He is on  lisinopril.  6. Dyslipidemia. He is on Crestor.  7. Barrett's esophagitis. He is on Protonix.   8. Disposition. He will be discharged to home. He will follow-up with [facility].  I certify that inpatient services furnished can reasonably be expected to improve the patient's condition.  Orson Slick, MD 01/07/2017, 12:54 PM

## 2017-01-07 NOTE — Progress Notes (Signed)
Recreation Therapy Notes  Date: 01.17.18 Time: 9:30 am Location: Craft Room  Group Topic: Self-esteem  Goal Area(s) Addresses:  Patient will write at least one positive trait about self. Patient will verbalize benefit of having a healthy self-esteem.  Behavioral Response: Did not attend  Intervention: I Am  Activity: Patients were given a worksheet with the letter I on it and were instructed to write as many positive traits about themselves inside the letter.  Education: LRT educated patients on ways they can increase their self-esteem.  Education Outcome: Patient did not attend group.   Clinical Observations/Feedback: Patient did not attend group.  Leonette Monarch, LRT/CTRS 01/07/2017 10:23 AM

## 2017-01-07 NOTE — Progress Notes (Signed)
Patient ID: JURIAH LANAHAN, male   DOB: 05/30/1961, 56 y.o.   MRN: [id number] Initial Nursing Assessment to the BMU completed. Admitted for worsening depressive symptoms and thoughts of suicide by jumping off a bridge or walk into oncoming traffic. A&Ox3, UDS positive for Barbiturates, Cocaine and Tricyclic; patient has no schedule medications and persistently looking for Seroquel 400 mg which Dr. Weber Cooks will not order due to Prolonged QT interval-PVT. PMHx: CAD, CVA, HTN, GERD, Barrett esophagus, Hemiplegia.  Skin assessment and contrabands search completed with Vonzella Nipple, RN, no contrabands found on patient or in his belongings. No tattoos but skin condition is poorly maintained with thick calluses on bilateral great toes.  Beverages provided, unit orientation completed, patient paced up and down frustrated that "I don't have anything to put me to sleep, I haven't slept in 5 days.Marland KitchenMarland Kitchen"

## 2017-01-07 NOTE — Progress Notes (Signed)
Pt lying in the floor in the hallway. States "I am more comfortable here." Tearful. Will not move from floor when encouraged. Safety maintained. Will continue to monitor.

## 2017-01-07 NOTE — Tx Team (Signed)
Interdisciplinary Treatment and Diagnostic Plan Update  01/07/2017 Time of Session: 10:30 AM Ian [name] MRN: [id number]  Principal Diagnosis: Severe recurrent major depression without psychotic features [facility])  Secondary Diagnoses: Principal Problem:   Severe recurrent major depression without psychotic features (Ian [name]) Active Problems:   Alcohol use disorder, moderate, dependence (Ian [name])   Cannabis use disorder, moderate, dependence (Ian [name])   Tobacco use disorder   Current Medications:  Current Facility-Administered Medications  Medication Dose Route Frequency Provider Last Rate Last Dose  . acetaminophen (TYLENOL) tablet 650 mg  650 mg Oral Q6H PRN [name], MD      . alum & mag hydroxide-simeth (MAALOX/MYLANTA) 200-200-20 MG/5ML suspension 30 mL  30 mL Oral Q4H PRN [name], MD      . magnesium hydroxide (MILK OF MAGNESIA) suspension 30 mL  30 mL Oral Daily PRN [name], MD      . nicotine (NICODERM CQ - dosed in mg/24 hours) patch 21 mg  21 mg Transdermal Daily Ian [name] B Ian Caison, MD   21 mg at 01/07/17 0807   PTA Medications: Prescriptions Prior to Admission  Medication Sig Dispense Refill Last Dose  . acetaminophen-codeine (TYLENOL #3) 300-30 MG tablet Take 1 tablet by mouth every 4 (four) hours as needed for pain.   prn at prn  . aspirin EC 81 MG EC tablet Take 1 tablet (81 mg total) by mouth daily. (Patient not taking: Reported on 01/04/2017) 30 tablet 0 Not Taking at Unknown time  . atorvastatin (LIPITOR) 40 MG tablet Take 1 tablet (40 mg total) by mouth daily at 6 PM. 30 tablet 0   . calcium carbonate (TUMS) 500 MG chewable tablet Chew 2 tablets (400 mg of elemental calcium total) by mouth 3 (three) times daily as needed for indigestion or heartburn. 90 tablet 0   . citalopram (CELEXA) 20 MG tablet Take 20 mg by mouth at bedtime.  0 01/03/2017 at Unknown time  . clonazePAM (KLONOPIN) 0.5 MG tablet Take 0.5 mg by mouth 2 (two) times daily as needed for anxiety.     prn at prn  . famotidine (PEPCID) 20 MG tablet Take 1 tablet (20 mg total) by mouth 2 (two) times daily. 60 tablet 0   . gabapentin (NEURONTIN) 100 MG capsule Take 1 capsule (100 mg total) by mouth 3 (three) times daily. 90 capsule 2   . hydrOXYzine (ATARAX/VISTARIL) 50 MG tablet Take 1 tablet by mouth 2 (two) times daily as needed.  0 prn at prn  . lisinopril (PRINIVIL,ZESTRIL) 10 MG tablet Take 10 mg by mouth daily.  0 Past Month at Unknown time  . ondansetron (ZOFRAN) 4 MG tablet Take 1 tablet (4 mg total) by mouth daily as needed for nausea or vomiting. (Patient not taking: Reported on 09/01/2016) 10 tablet 0 Not Taking at Unknown time  . oxyCODONE-acetaminophen (ROXICET) 5-325 MG per tablet Take 1 tablet by mouth every 6 (six) hours as needed. (Patient not taking: Reported on 09/01/2016) 12 tablet 0 Not Taking at Unknown time  . pantoprazole (PROTONIX) 40 MG tablet Take 40 mg by mouth daily.   Past Month at Unknown time  . PROAIR HFA 108 (90 BASE) MCG/ACT inhaler Inhale 1-2 puffs into the lungs every 6 (six) hours as needed for wheezing or shortness of breath.   0 prn at prn  . QUEtiapine (SEROQUEL) 300 MG tablet Take 1 tablet (300 mg total) by mouth at bedtime. (Patient taking differently: Take 400 mg by mouth at bedtime. )  30 tablet 0 01/03/2017 at Unknown time  . QUEtiapine (SEROQUEL) 50 MG tablet Take 1 tablet (50 mg total) by mouth daily at 12 noon. (Patient taking differently: Take 50 mg by mouth 2 (two) times daily. ) 30 tablet 0 prn at prn  . thiamine 50 MG tablet Take 1 tablet (50 mg total) by mouth daily. 30 tablet 0 Not Taking at Unknown time    Patient Stressors: Financial difficulties Health problems Medication change or noncompliance Substance abuse  Patient Strengths: Ability for insight Capable of independent living Motivation for treatment/growth Supportive family/friends  Treatment Modalities: Medication Management, Group therapy, Case management,  1 to 1 session with  clinician, Psychoeducation, Recreational therapy.   Physician Treatment Plan for Primary Diagnosis: Severe recurrent major depression without psychotic features ([place]) Long Term Goal(s):     Short Term Goals:    Medication Management: Evaluate patient's response, side effects, and tolerance of medication regimen.  Therapeutic Interventions: 1 to 1 sessions, Unit Group sessions and Medication administration.  Evaluation of Outcomes: Progressing  Physician Treatment Plan for Secondary Diagnosis: Principal Problem:   Severe recurrent major depression without psychotic features ([place]) Active Problems:   Alcohol use disorder, moderate, dependence (Ian [name])   Cannabis use disorder, moderate, dependence (Ian [name])   Tobacco use disorder  Long Term Goal(s):     Short Term Goals:       Medication Management: Evaluate patient's response, side effects, and tolerance of medication regimen.  Therapeutic Interventions: 1 to 1 sessions, Unit Group sessions and Medication administration.  Evaluation of Outcomes: Progressing   RN Treatment Plan for Primary Diagnosis: Severe recurrent major depression without psychotic features (Ian [name]) Long Term Goal(s): Knowledge of disease and therapeutic regimen to maintain health will improve  Short Term Goals: Ability to remain free from injury will improve, Ability to demonstrate self-control, Ability to disclose and discuss suicidal ideas and Compliance with prescribed medications will improve  Medication Management: RN will administer medications as ordered by provider, will assess and evaluate patient's response and provide education to patient for prescribed medication. RN will report any adverse and/or side effects to prescribing provider.  Therapeutic Interventions: 1 on 1 counseling sessions, Psychoeducation, Medication administration, Evaluate responses to treatment, Monitor vital signs and CBGs as ordered, Perform/monitor CIWA, COWS, AIMS and Fall Risk  screenings as ordered, Perform wound care treatments as ordered.  Evaluation of Outcomes: Progressing   LCSW Treatment Plan for Primary Diagnosis: Severe recurrent major depression without psychotic features ([place]) Long Term Goal(s): Safe transition to appropriate next level of care at discharge, Engage patient in therapeutic group addressing interpersonal concerns.  Short Term Goals: Engage patient in aftercare planning with referrals and resources, Increase social support and Facilitate acceptance of mental health diagnosis and concerns  Therapeutic Interventions: Assess for all discharge needs, 1 to 1 time with Social worker, Explore available resources and support systems, Assess for adequacy in community support network, Educate family and significant other(s) on suicide prevention, Complete Psychosocial Assessment, Interpersonal group therapy.  Evaluation of Outcomes: Progressing   Progress in Treatment: Attending groups: Yes. Participating in groups: Yes. Taking medication as prescribed: Yes. Toleration medication: Yes. Family/Significant other contact made: No, will contact:  CSW still assessing contacts Patient understands diagnosis: Yes. Discussing patient identified problems/goals with staff: Yes. Medical problems stabilized or resolved: Yes. Denies suicidal/homicidal ideation: Yes. Issues/concerns per patient self-inventory: No.  New problem(s) identified: No, Describe:  None identified.  Discharge Plan or Barriers:   Reason for Continuation of Hospitalization: Anxiety Depression Suicidal ideation  Estimated  Length of Stay: 3-5 days   Attendees: Patient: Ian [name] 01/07/2017 11:11 AM  Physician: Dr. Orson Slick, MD  01/07/2017 11:11 AM  Nursing: Elige Radon, BSN, RN 01/07/2017 11:11 AM  RN Care Manager: 01/07/2017 11:11 AM  Social Worker: Glorious Peach, MSW, LCSW-A 01/07/2017 11:11 AM  Recreational Therapist: Everitt Amber, LRT, CTRS  01/07/2017 11:11 AM     Scribe for Treatment Team: Emilie Rutter, [place] 01/07/2017 11:11 AM

## 2017-01-07 NOTE — Progress Notes (Signed)
Pt very agitated, irritable pacing the hallway. Tearful. Noted to be sitting in the hallway outside his room on several occasions this morning. Pt fixated on "I want my Seroquel, what are they going to do, they have me locked down here." Pt informed again of the rationale of holding his medications at this time (prolonged QT interval per EKG). Pt reports poor sleep last night, poor appetite, low energy, poor concentration. Rates depression 8/10, hopelessness 10/10, anxiety 10/10 (low 0-10 high). Endorses passive SI, does not elaborate on a plan at this time, but it is reported that he had plans to walk into traffic. Pt requested his shoes from his locker and consented for removal of his shoe strings. Pt given his shoe without strings.   Support and encouragement provided with use of therapeutic communicant. Medication administer as ordered (nicotene patch, no other meds ordered at this time). Safety maintained with every 15 minute checks. Will continue to monitor.

## 2017-01-07 NOTE — Tx Team (Signed)
Initial Treatment Plan 01/07/2017 12:34 AM Ernie Hew [id number]    PATIENT STRESSORS: Financial difficulties Health problems Medication change or noncompliance Substance abuse   PATIENT STRENGTHS: Ability for insight Capable of independent living Motivation for treatment/growth Supportive family/friends   PATIENT IDENTIFIED PROBLEMS: Mood Instability  Suicidal Thoughts  Substance (Cocaine & Alcohol)  Treatment & Medications Noncompliance               DISCHARGE CRITERIA:  Improved stabilization in mood, thinking, and/or behavior Motivation to continue treatment in a less acute level of care Verbal commitment to aftercare and medication compliance  PRELIMINARY DISCHARGE PLAN: Outpatient therapy Return to previous living arrangement  PATIENT/FAMILY INVOLVEMENT: This treatment plan has been presented to and reviewed with the patient, Ian [name].  The patient has been given the opportunity to ask questions and make suggestions.  Electa Sniff, RN 01/07/2017, 12:34 AM

## 2017-01-07 NOTE — Plan of Care (Signed)
Problem: Activity: Goal: Interest or engagement in leisure activities will improve Outcome: Not Progressing Pt did not attend group this morning, did attend group this afternoon.

## 2017-01-08 MED ORDER — TEMAZEPAM 15 MG PO CAPS
15.0000 mg | ORAL_CAPSULE | Freq: Every evening | ORAL | Status: DC | PRN
  Administered 2017-01-08: 15 mg via ORAL
  Filled 2017-01-08: qty 1

## 2017-01-08 NOTE — Progress Notes (Signed)
D: Patient stated slept fair last night .Stated appetite is good and energy level  low. Stated concentration ispoor . Stated on Depression scale 8 , hopeless8 and anxiety 6 .( low 0-10 high) Voice suicidal  Thoughts  homicidal ideations  .  No auditory hallucinations  No pain concerns . Appropriate ADL'S. Interacting with peers and staff.  A: Encourage patient participation with unit programming . Instruction  Given on  Medication , verbalize understanding. R: Voice no other concerns. Staff continue to monitor

## 2017-01-08 NOTE — Progress Notes (Signed)
Patient ID: Ian [name], male   DOB: [birth year], 56 y.o.   MRN: [id number] Ian [name]'s mood is much better due to medication adjustment, less irritable, visible in the milieu, anticipated plan of care (EKG) in the morning discussed, understanding verbalized. Pt denied SI/SIB/HI, denied AV/H.

## 2017-01-08 NOTE — [facility] Group Notes (Signed)
[place] Group Notes:  (Nursing/MHT/Case Management/Adjunct)  Date:  01/08/2017  Time:  6:12 PM  Type of Therapy:  Psychoeducational Skills  Participation Level:  Did Not Attend    Drake Leach 01/08/2017, 6:12 PM

## 2017-01-08 NOTE — Progress Notes (Signed)
Recreation Therapy Notes  Date: 01.18.18 Time: 9:30 am Location: Craft Room  Group Topic: Leisure Education  Goal Area(s) Addresses:  Patient will identify things they are grateful for. Patient will identify how being grateful can influence decision making.  Behavioral Response: Did not attend  Intervention: Grateful Wheel  Activity: Patients were given an I Am Grateful For worksheet and were instructed to write things they are grateful for under each category.  Education: LRT educated patients on leisure and why it is important.  Education Outcome: Patient did not attend group.   Clinical Observations/Feedback: Patient did not attend group.  Leonette Monarch, LRT/CTRS 01/08/2017 10:14 AM

## 2017-01-08 NOTE — [facility] Group Notes (Signed)
[place] LCSW Group Therapy Note  Date/Time: 01/08/17 1300  Type of Therapy/Topic:  Group Therapy:  Balance in Life  Participation Level:  minimal  Description of Group:    This group will address the concept of balance and how it feels and looks when one is unbalanced. Patients will be encouraged to process areas in their lives that are out of balance, and identify reasons for remaining unbalanced. Facilitators will guide patients utilizing problem- solving interventions to address and correct the stressor making their life unbalanced. Understanding and applying boundaries will be explored and addressed for obtaining  and maintaining a balanced life. Patients will be encouraged to explore ways to assertively make their unbalanced needs known to significant others in their lives, using other group members and facilitator for support and feedback.  Therapeutic Goals: 1. Patient will identify two or more emotions or situations they have that consume much of in their lives. 2. Patient will identify signs/triggers that life has become out of balance:  3. Patient will identify two ways to set boundaries in order to achieve balance in their lives:  4. Patient will demonstrate ability to communicate their needs through discussion and/or role plays  Summary of Patient Progress: Pt shared that his physical health and his mental health are areas that can get out of balance.  Pt shared that he tends to worry a lot and this can dominate his thoughts frequently.      Therapeutic Modalities:   Cognitive Behavioral Therapy Solution-Focused Therapy Assertiveness Training  Lurline Idol, [place]

## 2017-01-08 NOTE — Plan of Care (Signed)
Problem: Coping: Goal: Ability to verbalize frustrations and anger appropriately will improve Outcome: Progressing Encourage  To work on Radiographer, therapeutic

## 2017-01-08 NOTE — Plan of Care (Signed)
Problem: Activity: Goal: Sleeping patterns will improve Outcome: Progressing Patient slept for Estimated Hours of 6; every 15 minutes safety rounds maintained, no injury or falls during this shift.

## 2017-01-08 NOTE — Progress Notes (Signed)
[facility] MD Progress Note  01/08/2017 9:31 AM Ian [name]  MRN:  [id number]  Subjective:   Ian [name] still feels severely depressed and suicidal. He did not sleep last night and again requests increase in the dose of Seroquel. We will lower her Seroquel to 50 mg twice daily and 200 at bedtime as the worries about elongated QTC. We are still awaiting daily EKG. I'll discontinue the Lexapro as it may also prolong QTC and started Wellbutrin today. We will offer Restoril at night. The patient denies somatic symptoms today. He went to morning group.  Per nursing: Ian [name]'s mood is much better due to medication adjustment, less irritable, visible in the milieu, anticipated plan of care (EKG) in the morning discussed, understanding verbalized. Pt denied SI/SIB/HI, denied AV/H.  Principal Problem: Bipolar I disorder, most recent episode depressed with anxious distress (Ian [name]) Diagnosis:   Patient Active Problem List   Diagnosis Date Noted  . Dyslipidemia [E78.5] 01/07/2017  . Barrett's esophagus [K22.70] 01/07/2017  . Bipolar I disorder, most recent episode depressed with anxious distress ([place]) [F31.30] 01/06/2017  . Cannabis use disorder, moderate, dependence ([place]) [F12.20] 01/06/2017  . Tobacco use disorder [F17.200] 01/06/2017  . Alcohol use disorder, moderate, dependence ([place]) [F10.20] 10/06/2016  . Substance induced mood disorder ([place]) [F19.94] 10/06/2016  . Cocaine use disorder, moderate, dependence ([place]) [F14.20] 10/06/2016  . Depression [F32.9] 10/06/2016  . Acute left-sided weakness [M62.89] 05/03/2015  . CVA (cerebral infarction) [I63.9] 05/03/2015  . Weakness [R53.1] 11/16/2013  . Chest pain [R07.9] 11/16/2013  . HTN (hypertension) [I10] 11/16/2013  . Left-sided weakness [R53.1] 11/16/2013  . Torsades de pointes ([place]) [I47.2] 11/16/2013  . Hemiplegia, unspecified, affecting nondominant side [G81.90] 11/15/2013  . Speech and language deficits [F80.9, R47.9] 11/15/2013   Total Time  spent with patient: 20 minutes  Past Psychiatric History: depression.  Past Medical History:  Past Medical History:  Diagnosis Date  . Anxiety   . Barrett esophagus   . Cancer ([place])   . Coronary artery disease   . Depression   . GERD (gastroesophageal reflux disease)   . Hypertension   . Stroke Ian [name]) 2014   "mini-stroke" per patient    Past Surgical History:  Procedure Laterality Date  . ANGIOPLASTY    . APPENDECTOMY    . CARDIAC CATHETERIZATION    . CHOLECYSTECTOMY    . WRIST SURGERY Right    age 70   Family History:  Family History  Problem Relation Age of Onset  . Stroke Father   . Leukemia Father   . Breast cancer Sister    Family Psychiatric  History: see H&P. Social History:  History  Alcohol Use  . Yes    Comment: denies being a daily drinker but did have alcohol today     History  Drug Use  . Types: Cocaine, Marijuana    Comment: used cocaine today; denies smoking marijuana for years    Social History   Social History  . Marital status: Single    Spouse name: N/A  . Number of children: N/A  . Years of education: N/A   Social History Main Topics  . Smoking status: Current Every Day Smoker    Packs/day: 1.00    Types: Cigarettes  . Smokeless tobacco: Never Used  . Alcohol use Yes     Comment: denies being a daily drinker but did have alcohol today  . Drug use: Yes    Types: Cocaine, Marijuana     Comment: used cocaine today; denies smoking  marijuana for years  . Sexual activity: Not Asked   Other Topics Concern  . None   Social History Narrative  . None   Additional Social History:                         Sleep: Poor  Appetite:  Good  Current Medications: Current Facility-Administered Medications  Medication Dose Route Frequency Provider Last Rate Last Dose  . acetaminophen (TYLENOL) tablet 650 mg  650 mg Oral Q6H PRN [name], MD      . albuterol (PROVENTIL HFA;VENTOLIN HFA) 108 (90 Base) MCG/ACT inhaler 1 puff  1  puff Inhalation Q6H PRN Clovis Fredrickson, MD   1 puff at 01/07/17 2127  . alum & mag hydroxide-simeth (MAALOX/MYLANTA) 200-200-20 MG/5ML suspension 30 mL  30 mL Oral Q4H PRN [name], MD      . aspirin EC tablet 81 mg  81 mg Oral Daily Zabian Swayne B Laury Huizar, MD   81 mg at 01/08/17 0827  . atorvastatin (LIPITOR) tablet 40 mg  40 mg Oral q1800 Phineas Mcenroe B Shammond Arave, MD      . buPROPion (WELLBUTRIN XL) 24 hr tablet 150 mg  150 mg Oral Daily Jettson Crable B Raksha Wolfgang, MD   150 mg at 01/08/17 0827  . chlordiazePOXIDE (LIBRIUM) capsule 10 mg  10 mg Oral TID Clovis Fredrickson, MD   10 mg at 01/08/17 0827  . lisinopril (PRINIVIL,ZESTRIL) tablet 10 mg  10 mg Oral Daily Clovis Fredrickson, MD   10 mg at 01/08/17 0827  . magnesium hydroxide (MILK OF MAGNESIA) suspension 30 mL  30 mL Oral Daily PRN [name], MD      . nicotine (NICODERM CQ - dosed in mg/24 hours) patch 21 mg  21 mg Transdermal Daily Letizia Hook B Servando Kyllonen, MD   21 mg at 01/08/17 0830  . QUEtiapine (SEROQUEL XR) 24 hr tablet 200 mg  200 mg Oral QHS Brock Mokry B Lynnita Somma, MD   200 mg at 01/07/17 2124  . QUEtiapine (SEROQUEL XR) 24 hr tablet 50 mg  50 mg Oral BH-q7a Cherylee Rawlinson B Franki Alcaide, MD   50 mg at 01/08/17 0630  . QUEtiapine (SEROQUEL XR) 24 hr tablet 50 mg  50 mg Oral QAC lunch Clovis Fredrickson, MD   50 mg at 01/07/17 1257    Lab Results:  Results for orders placed or performed during the hospital encounter of 01/06/17 (from the past 48 hour(s))  Lipid panel     Status: Abnormal   Collection Time: 01/07/17  7:33 AM  Result Value Ref Range   Cholesterol 153 0 - 200 mg/dL   Triglycerides 155 (H) <150 mg/dL   HDL 36 (L) >40 mg/dL   Total CHOL/HDL Ratio 4.3 RATIO   VLDL 31 0 - 40 mg/dL   LDL Cholesterol 86 0 - 99 mg/dL    Comment:        Total Cholesterol/HDL:CHD Risk Coronary Heart Disease Risk Table                     Men   Women  1/2 Average Risk   3.4   3.3  Average Risk       5.0   4.4  2 X Average Risk   9.6    7.1  3 X Average Risk  23.4   11.0        Use the calculated Patient Ratio above and the CHD Risk Table to determine  the patient's CHD Risk.        ATP III CLASSIFICATION (LDL):  <100     mg/dL   Optimal  100-129  mg/dL   Near or Above                    Optimal  130-159  mg/dL   Borderline  160-189  mg/dL   High  >190     mg/dL   Very High   TSH     Status: None   Collection Time: 01/07/17  7:33 AM  Result Value Ref Range   TSH 2.304 0.350 - 4.500 uIU/mL    Comment: Performed by a 3rd Generation assay with a functional sensitivity of <=0.01 uIU/mL.    Blood Alcohol level:  Lab Results  Component Value Date   ETH <5 01/06/2017   ETH 111 (H) 123456    Metabolic Disorder Labs: Lab Results  Component Value Date   HGBA1C 5.3 09/02/2016   MPG 103 11/16/2013   No results found for: PROLACTIN Lab Results  Component Value Date   CHOL 153 01/07/2017   TRIG 155 (H) 01/07/2017   HDL 36 (L) 01/07/2017   CHOLHDL 4.3 01/07/2017   VLDL 31 01/07/2017   LDLCALC 86 01/07/2017   LDLCALC 75 09/02/2016    Physical Findings: AIMS:  , ,  ,  ,    CIWA:    COWS:     Musculoskeletal: Strength & Muscle Tone: within normal limits Gait & Station: normal Patient leans: N/A  Psychiatric Specialty Exam: Physical Exam  Nursing note and vitals reviewed. Psychiatric: His speech is normal and behavior is normal. His mood appears anxious. Cognition and memory are normal. He expresses impulsivity. He exhibits a depressed mood. He expresses suicidal ideation. He expresses suicidal plans.    Review of Systems  Respiratory: Positive for shortness of breath.   Psychiatric/Behavioral: Positive for depression, substance abuse and suicidal ideas. The patient is nervous/anxious and has insomnia.   All other systems reviewed and are negative.   Blood pressure 125/89, pulse 80, temperature 98.2 F (36.8 C), resp. rate 18, height 6' (1.829 m), weight 109.8 kg (242 lb), SpO2 98 %.Body mass  index is 32.82 kg/m.  General Appearance: Casual  Eye Contact:  Good  Speech:  Clear and Coherent  Volume:  Normal  Mood:  Anxious and Depressed  Affect:  Labile and Tearful  Thought Process:  Goal Directed and Descriptions of Associations: Intact  Orientation:  Full (Time, Place, and Person)  Thought Content:  WDL and Logical  Suicidal Thoughts:  Yes.  with intent/plan  Homicidal Thoughts:  No  Memory:  Immediate;   Fair Recent;   Fair Remote;   Fair  Judgement:  Impaired  Insight:  Shallow  Psychomotor Activity:  Restlessness  Concentration:  Concentration: Fair and Attention Span: Fair  Recall:  [employer] of Knowledge:  Fair  Language:  Fair  Akathisia:  No  Handed:  Right  AIMS (if indicated):     Assets:  Communication Skills Desire for Improvement Financial Resources/Insurance Housing Resilience  ADL's:  Intact  Cognition:  WNL  Sleep:  Number of Hours: 6     Treatment Plan Summary: Daily contact with patient to assess and evaluate symptoms and progress in treatment and Medication management   Mr. Deblock is a 56 year old male with history of depression, anxiety, mood instability and alcoholism admitted for suicidal ideation and heightened anxiety in the context of medication changes and discontinuation of  alcohol.  1. Suicidal ideation. The patient is able to contract for safety in the hospital.  2. Mood. For the past 5 years the patient has been maintained on a combination of Seroquel, Celexa, and clonazepam. Clonazepam has been gradually discontinued by his primary psychiatrist. The patient has a history of torsades in 2014. His QTC this morning was 446 but it was 588 just 2 days ago. In reviewing his older EKGs I see that his QTC is usually around 440. The patient is demanding to put him back on Seroquel. He understands the risks. I will start Seroquel 50 mg at breakfast and lunch and 200 mg at bedtime. I will discontinue Celexa and start Wellbutrin. I will  order daily EKG. We will may need cardiology consultation.  3. Alcoholism. He stopped drinking 2 days ago. We will start low-dose Librium.  4. Substance abuse treatment. The patient refuses long-term treatment. He will follow up with [facility].  5. Hypertension. He is on lisinopril.  6. Dyslipidemia. He is on Crestor.  7. Barrett's esophagitis. He is on Protonix.   8. Metabolic syndrome monitoring. Lipid profilw, TSH and HgbA1C are pending.   9. COPD. Albuterol is available.  10. Insomnia. This usually is resolved with nightly Seroquel 300 but not 200 mg. We will offer Restoril at night.   11. Disposition. He will be discharged to home. He will follow-up with [facility].  Orson Slick, MD 01/08/2017, 9:31 AM

## 2017-01-09 DIAGNOSIS — I4581 Long QT syndrome: Secondary | ICD-10-CM

## 2017-01-09 LAB — HEMOGLOBIN A1C
Hgb A1c MFr Bld: 5.2 % (ref 4.8–5.6)
Mean Plasma Glucose: 103 mg/dL

## 2017-01-09 MED ORDER — QUETIAPINE FUMARATE ER 300 MG PO TB24
300.0000 mg | ORAL_TABLET | Freq: Every day | ORAL | Status: DC
  Administered 2017-01-09 – 2017-01-11 (×3): 300 mg via ORAL
  Filled 2017-01-09 (×3): qty 1

## 2017-01-09 NOTE — BHH Group Notes (Signed)
[facility] LCSW Group Therapy   01/09/2017 9:30 AM   Type of Therapy: Group Therapy   Participation Level: Active   Participation Quality: Attentive, Sharing and Supportive   Affect: Appropriate   Cognitive: Alert and Oriented   Insight: Developing/Improving and Engaged   Engagement in Therapy: Developing/Improving and Engaged   Modes of Intervention: Clarification, Confrontation, Discussion, Education, Exploration, Limit-setting, Orientation, Problem-solving, Rapport Building, Art therapist, Socialization and Support   Summary of Progress/Problems: The topic for today was feelings about relapse. Pt discussed what relapse prevention is to them and identified triggers that they are on the path to relapse. Pt processed their feeling towards relapse and was able to relate to peers. Pt discussed coping skills that can be used for relapse prevention. Pt defined relapse as a "stumble." He stated that symptoms such as isolation and irritability becomes difficult to ignore during a relapse. Pt identified medication compliance and family support as ways to reduce relapse.   Glorious Peach, MSW, LCSWA 01/09/2017, 3:16PM

## 2017-01-09 NOTE — BHH Group Notes (Signed)
[facility] Group Notes:  (Nursing/MHT/Case Management/Adjunct)  Date:  01/09/2017  Time:  11:07 PM  Type of Therapy:  Group Therapy  Participation Level:  Active  Participation Quality:  Appropriate  Affect:  Appropriate  Cognitive:  Appropriate  Insight:  Appropriate  Engagement in Group:  Engaged  Modes of Intervention:  Discussion  Summary of Progress/Problems:  Kandis Fantasia 01/09/2017, 11:07 PM

## 2017-01-09 NOTE — Progress Notes (Signed)
D: Patient stated slept poor last night .Stated appetite isfairand energy level  lowl. Stated concentration poor . Stated on Depression scale 5 hopeless 5 and anxiety 5 .( low 0-10 high) Denies suicidal  homicidal ideations  .  No auditory hallucinations  No pain concerns . Appropriate ADL'S. Interacting with peers and staff. Patient  Got upset about not being able to receive an sedative noted to pace halls  A: Encourage patient participation with unit programming . Instruction  Given on  Medication , verbalize understanding. R: Voice no other concerns. Staff continue to monitor

## 2017-01-09 NOTE — Progress Notes (Signed)
[facility] MD Progress Note  01/09/2017 6:53 PM Ian [name]  MRN:  [id number]  Subjective:   01/08/2017 Mr. Ishmael still feels severely depressed and suicidal. He did not sleep last night and again requests increase in the dose of Seroquel. We will lower her Seroquel to 50 mg twice daily and 200 at bedtime as the worries about elongated QTC. We are still awaiting daily EKG. I'll discontinue the Lexapro as it may also prolong QTC and started Wellbutrin today. We will offer Restoril at night. The patient denies somatic symptoms today. He went to morning group.  01/09/2017 Mr. Neglia still complains of sever depression, suicidal thinking and severe anxiety. He slept 6 hours last night. Daily EKG is unremarkable with QTC 430, no change from initial evaluation in a patient with a history of torsades and on antipsychotic.He definitely down playes substance use and is not interested in treatment. He is asking to increase Seroquel to 300 mg nightly. Will do.  Per nursing: D: Patient stated slept poor last night .Stated appetite isfairand energy level  lowl. Stated concentration poor . Stated on Depression scale 5 hopeless 5 and anxiety 5 .( low 0-10 high) Denies suicidal  homicidal ideations  .  No auditory hallucinations  No pain concerns . Appropriate ADL'S. Interacting with peers and staff. Patient  Got upset about not being able to receive an sedative noted to pace halls  A: Encourage patient participation with unit programming . Instruction  Given on  Medication , verbalize understanding. R: Voice no other concerns. Staff continue to monitor   Principal Problem: Bipolar I disorder, most recent episode depressed with anxious distress ([place]) Diagnosis:   Patient Active Problem List   Diagnosis Date Noted  . Dyslipidemia [E78.5] 01/07/2017  . Barrett's esophagus [K22.70] 01/07/2017  . Bipolar I disorder, most recent episode depressed with anxious distress ([place]) [F31.30] 01/06/2017  . Cannabis use  disorder, moderate, dependence ([place]) [F12.20] 01/06/2017  . Tobacco use disorder [F17.200] 01/06/2017  . Alcohol use disorder, moderate, dependence ([place]) [F10.20] 10/06/2016  . Substance induced mood disorder ([place]) [F19.94] 10/06/2016  . Cocaine use disorder, moderate, dependence ([place]) [F14.20] 10/06/2016  . Depression [F32.9] 10/06/2016  . Acute left-sided weakness [M62.89] 05/03/2015  . CVA (cerebral infarction) [I63.9] 05/03/2015  . Weakness [R53.1] 11/16/2013  . Chest pain [R07.9] 11/16/2013  . HTN (hypertension) [I10] 11/16/2013  . Left-sided weakness [R53.1] 11/16/2013  . Torsades de pointes ([place]) [I47.2] 11/16/2013  . Hemiplegia, unspecified, affecting nondominant side [G81.90] 11/15/2013  . Speech and language deficits [F80.9, R47.9] 11/15/2013   Total Time spent with patient: 20 minutes  Past Psychiatric History: depression.  Past Medical History:  Past Medical History:  Diagnosis Date  . Anxiety   . Barrett esophagus   . Cancer ([place])   . Coronary artery disease   . Depression   . GERD (gastroesophageal reflux disease)   . Hypertension   . Stroke [facility]) 2014   "mini-stroke" per patient    Past Surgical History:  Procedure Laterality Date  . ANGIOPLASTY    . APPENDECTOMY    . CARDIAC CATHETERIZATION    . CHOLECYSTECTOMY    . WRIST SURGERY Right    age 56   Family History:  Family History  Problem Relation Age of Onset  . Stroke Father   . Leukemia Father   . Breast cancer Sister    Family Psychiatric  History: see H&P. Social History:  History  Alcohol Use  . Yes    Comment: denies being a  daily drinker but did have alcohol today     History  Drug Use  . Types: Cocaine, Marijuana    Comment: used cocaine today; denies smoking marijuana for years    Social History   Social History  . Marital status: Single    Spouse name: N/A  . Number of children: N/A  . Years of education: N/A   Social History Main Topics  . Smoking status: Current Every  Day Smoker    Packs/day: 1.00    Types: Cigarettes  . Smokeless tobacco: Never Used  . Alcohol use Yes     Comment: denies being a daily drinker but did have alcohol today  . Drug use: Yes    Types: Cocaine, Marijuana     Comment: used cocaine today; denies smoking marijuana for years  . Sexual activity: Not Asked   Other Topics Concern  . None   Social History Narrative  . None   Additional Social History:                         Sleep: Poor  Appetite:  Good  Current Medications: Current Facility-Administered Medications  Medication Dose Route Frequency Provider Last Rate Last Dose  . acetaminophen (TYLENOL) tablet 650 mg  650 mg Oral Q6H PRN [name], MD   650 mg at 01/08/17 1432  . albuterol (PROVENTIL HFA;VENTOLIN HFA) 108 (90 Base) MCG/ACT inhaler 1 puff  1 puff Inhalation Q6H PRN Clovis Fredrickson, MD   1 puff at 01/08/17 2141  . alum & mag hydroxide-simeth (MAALOX/MYLANTA) 200-200-20 MG/5ML suspension 30 mL  30 mL Oral Q4H PRN [name], MD      . aspirin EC tablet 81 mg  81 mg Oral Daily Durelle Zepeda B Vashawn Ekstein, MD   81 mg at 01/09/17 0748  . atorvastatin (LIPITOR) tablet 40 mg  40 mg Oral q1800 Aimar Borghi B Britiany Silbernagel, MD   40 mg at 01/09/17 1716  . buPROPion (WELLBUTRIN XL) 24 hr tablet 150 mg  150 mg Oral Daily Sharonlee Nine B Acelynn Dejonge, MD   150 mg at 01/09/17 0747  . chlordiazePOXIDE (LIBRIUM) capsule 10 mg  10 mg Oral TID Clovis Fredrickson, MD   10 mg at 01/09/17 1718  . lisinopril (PRINIVIL,ZESTRIL) tablet 10 mg  10 mg Oral Daily Clovis Fredrickson, MD   10 mg at 01/09/17 0747  . magnesium hydroxide (MILK OF MAGNESIA) suspension 30 mL  30 mL Oral Daily PRN [name], MD      . nicotine (NICODERM CQ - dosed in mg/24 hours) patch 21 mg  21 mg Transdermal Daily Barney Russomanno B Jaylynne Birkhead, MD   21 mg at 01/09/17 0748  . QUEtiapine (SEROQUEL XR) 24 hr tablet 200 mg  200 mg Oral QHS Jamielynn Wigley B Devereaux Grayson, MD   200 mg at 01/08/17 2138  . QUEtiapine  (SEROQUEL XR) 24 hr tablet 50 mg  50 mg Oral BH-q7a Clovis Fredrickson, MD   50 mg at 01/09/17 0650  . QUEtiapine (SEROQUEL XR) 24 hr tablet 50 mg  50 mg Oral QAC lunch Chablis Losh B Dwayna Kentner, MD   50 mg at 01/09/17 1233  . temazepam (RESTORIL) capsule 15 mg  15 mg Oral QHS PRN Clovis Fredrickson, MD   15 mg at 01/08/17 2139    Lab Results:  No results found for this or any previous visit (from the past 48 hour(s)).  Blood Alcohol level:  Lab Results  Component Value Date  ETH <5 01/06/2017   ETH 111 (H) 123456    Metabolic Disorder Labs: Lab Results  Component Value Date   HGBA1C 5.2 01/07/2017   MPG 103 01/07/2017   MPG 103 11/16/2013   No results found for: PROLACTIN Lab Results  Component Value Date   CHOL 153 01/07/2017   TRIG 155 (H) 01/07/2017   HDL 36 (L) 01/07/2017   CHOLHDL 4.3 01/07/2017   VLDL 31 01/07/2017   LDLCALC 86 01/07/2017   LDLCALC 75 09/02/2016    Physical Findings: AIMS:  , ,  ,  ,    CIWA:    COWS:     Musculoskeletal: Strength & Muscle Tone: within normal limits Gait & Station: normal Patient leans: N/A  Psychiatric Specialty Exam: Physical Exam  Nursing note and vitals reviewed. Psychiatric: His speech is normal and behavior is normal. His mood appears anxious. Cognition and memory are normal. He expresses impulsivity. He exhibits a depressed mood. He expresses suicidal ideation. He expresses suicidal plans.    Review of Systems  Respiratory: Positive for shortness of breath.   Psychiatric/Behavioral: Positive for depression, substance abuse and suicidal ideas. The patient is nervous/anxious and has insomnia.   All other systems reviewed and are negative.   Blood pressure 106/74, pulse 83, temperature 98 F (36.7 C), temperature source Oral, resp. rate 18, height 6' (1.829 m), weight 109.8 kg (242 lb), SpO2 98 %.Body mass index is 32.82 kg/m.  General Appearance: Casual  Eye Contact:  Good  Speech:  Clear and Coherent   Volume:  Normal  Mood:  Anxious and Depressed  Affect:  Labile and Tearful  Thought Process:  Goal Directed and Descriptions of Associations: Intact  Orientation:  Full (Time, Place, and Person)  Thought Content:  WDL and Logical  Suicidal Thoughts:  Yes.  with intent/plan  Homicidal Thoughts:  No  Memory:  Immediate;   Fair Recent;   Fair Remote;   Fair  Judgement:  Impaired  Insight:  Shallow  Psychomotor Activity:  Restlessness  Concentration:  Concentration: Fair and Attention Span: Fair  Recall:  [employer] of Knowledge:  Fair  Language:  Fair  Akathisia:  No  Handed:  Right  AIMS (if indicated):     Assets:  Communication Skills Desire for Improvement Financial Resources/Insurance Housing Resilience  ADL's:  Intact  Cognition:  WNL  Sleep:  Number of Hours: 6.25     Treatment Plan Summary: Daily contact with patient to assess and evaluate symptoms and progress in treatment and Medication management   Mr. Nanny is a 56 year old male with history of depression, anxiety, mood instability and alcoholism admitted for suicidal ideation and heightened anxiety in the context of medication changes and discontinuation of alcohol.  1. Suicidal ideation. The patient is able to contract for safety in the hospital.  2. Mood. For the past 5 years the patient has been maintained on a combination of Seroquel, Celexa, and clonazepam. Clonazepam has been gradually discontinued by his primary psychiatrist. The patient has a history of torsades in 2014. His QTC this morning was 430 but it was 588 just few days ago. In reviewing his older EKGs I see that his QTC is usually around 440. The patient is demanding to put him back on Seroquel. He understands the risks. I will continue Seroquel 50 mg at breakfast and lunch and increase evening dose to 300 mg. I discontinued Celexa and started Wellbutrin as it does not affect QTC. I will continue daily EKG.  3. Alcoholism. He stopped  drinking 2 days ago. We completed Librium taper.  4. Substance abuse treatment. The patient refuses long-term treatment. He will follow up with [facility].  5. Hypertension. He is on lisinopril.  6. Dyslipidemia. He is on Crestor.  7. Barrett's esophagitis. He is on Protonix.   8. Metabolic syndrome monitoring. Lipid profile, TSH and HgbA1C are normal.    9. COPD. Albuterol is available.  10. Insomnia. I will discontinue Restoril.    11. Disposition. He will be discharged to home. He will follow-up with [facility].  Orson Slick, MD 01/09/2017, 6:53 PM

## 2017-01-09 NOTE — Progress Notes (Signed)
Recreation Therapy Notes  Date: 01.19.18 Time: 1:00 pm Location: Craft Room  Group Topic: Social Skills  Goal Area(s) Addresses:  Patient will effectively work with peer towards shared goal. Patient will identify skills used to make activities successful. Patient will identify benefit of using group skills effectively post d/c.  Behavioral Response: Did not attend  Intervention: [employer]  Activity: Patients divided into group. Patients were given 15 pipe cleaners and were instructed to build a free standing tower. Patients were given 2 minutes to strategize. After about 5 minutes of building, patients were instructed to put their dominant hand behind their back. After about 5 more minutes, patients were instructed to stop talking to each other.  Education: LRT educated patients on healthy support systems.  Education Outcome: Patient did not attend group.  Clinical Observations/Feedback: Patient came to group, but left at 1:10 pm before group started.  Leonette Monarch, LRT/CTRS 01/09/2017 2:04 PM

## 2017-01-09 NOTE — Tx Team (Signed)
Interdisciplinary Treatment and Diagnostic Plan Update  01/09/2017 Time of Session: 10:30am ROWLEY GROENEWEG MRN: [id number]  Principal Diagnosis: Bipolar I disorder, most recent episode depressed with anxious distress ([place])  Secondary Diagnoses: Principal Problem:   Bipolar I disorder, most recent episode depressed with anxious distress ([place]) Active Problems:   HTN (hypertension)   Alcohol use disorder, moderate, dependence ([place])   Cannabis use disorder, moderate, dependence ([place])   Tobacco use disorder   Dyslipidemia   Barrett's esophagus   Current Medications:  Current Facility-Administered Medications  Medication Dose Route Frequency Provider Last Rate Last Dose  . acetaminophen (TYLENOL) tablet 650 mg  650 mg Oral Q6H PRN [name], MD   650 mg at 01/08/17 1432  . albuterol (PROVENTIL HFA;VENTOLIN HFA) 108 (90 Base) MCG/ACT inhaler 1 puff  1 puff Inhalation Q6H PRN Clovis Fredrickson, MD   1 puff at 01/08/17 2141  . alum & mag hydroxide-simeth (MAALOX/MYLANTA) 200-200-20 MG/5ML suspension 30 mL  30 mL Oral Q4H PRN [name], MD      . aspirin EC tablet 81 mg  81 mg Oral Daily Jolanta B Pucilowska, MD   81 mg at 01/09/17 0748  . atorvastatin (LIPITOR) tablet 40 mg  40 mg Oral q1800 Jolanta B Pucilowska, MD   40 mg at 01/08/17 1811  . buPROPion (WELLBUTRIN XL) 24 hr tablet 150 mg  150 mg Oral Daily Jolanta B Pucilowska, MD   150 mg at 01/09/17 0747  . chlordiazePOXIDE (LIBRIUM) capsule 10 mg  10 mg Oral TID Clovis Fredrickson, MD   10 mg at 01/09/17 1233  . lisinopril (PRINIVIL,ZESTRIL) tablet 10 mg  10 mg Oral Daily Clovis Fredrickson, MD   10 mg at 01/09/17 0747  . magnesium hydroxide (MILK OF MAGNESIA) suspension 30 mL  30 mL Oral Daily PRN [name], MD      . nicotine (NICODERM CQ - dosed in mg/24 hours) patch 21 mg  21 mg Transdermal Daily Jolanta B Pucilowska, MD   21 mg at 01/09/17 0748  . QUEtiapine (SEROQUEL XR) 24 hr tablet 200 mg  200 mg Oral QHS  Jolanta B Pucilowska, MD   200 mg at 01/08/17 2138  . QUEtiapine (SEROQUEL XR) 24 hr tablet 50 mg  50 mg Oral BH-q7a Clovis Fredrickson, MD   50 mg at 01/09/17 0650  . QUEtiapine (SEROQUEL XR) 24 hr tablet 50 mg  50 mg Oral QAC lunch Jolanta B Pucilowska, MD   50 mg at 01/09/17 1233  . temazepam (RESTORIL) capsule 15 mg  15 mg Oral QHS PRN Clovis Fredrickson, MD   15 mg at 01/08/17 2139   PTA Medications: Prescriptions Prior to Admission  Medication Sig Dispense Refill Last Dose  . acetaminophen-codeine (TYLENOL #3) 300-30 MG tablet Take 1 tablet by mouth every 4 (four) hours as needed for pain.   prn at prn  . aspirin EC 81 MG EC tablet Take 1 tablet (81 mg total) by mouth daily. (Patient not taking: Reported on 01/04/2017) 30 tablet 0 Not Taking at Unknown time  . atorvastatin (LIPITOR) 40 MG tablet Take 1 tablet (40 mg total) by mouth daily at 6 PM. 30 tablet 0   . calcium carbonate (TUMS) 500 MG chewable tablet Chew 2 tablets (400 mg of elemental calcium total) by mouth 3 (three) times daily as needed for indigestion or heartburn. 90 tablet 0   . citalopram (CELEXA) 20 MG tablet Take 20 mg by mouth at bedtime.  0 01/03/2017 at Unknown time  . clonazePAM (KLONOPIN) 0.5 MG tablet Take 0.5 mg by mouth 2 (two) times daily as needed for anxiety.    prn at prn  . famotidine (PEPCID) 20 MG tablet Take 1 tablet (20 mg total) by mouth 2 (two) times daily. 60 tablet 0   . gabapentin (NEURONTIN) 100 MG capsule Take 1 capsule (100 mg total) by mouth 3 (three) times daily. 90 capsule 2   . hydrOXYzine (ATARAX/VISTARIL) 50 MG tablet Take 1 tablet by mouth 2 (two) times daily as needed.  0 prn at prn  . lisinopril (PRINIVIL,ZESTRIL) 10 MG tablet Take 10 mg by mouth daily.  0 Past Month at Unknown time  . ondansetron (ZOFRAN) 4 MG tablet Take 1 tablet (4 mg total) by mouth daily as needed for nausea or vomiting. (Patient not taking: Reported on 09/01/2016) 10 tablet 0 Not Taking at Unknown time  .  oxyCODONE-acetaminophen (ROXICET) 5-325 MG per tablet Take 1 tablet by mouth every 6 (six) hours as needed. (Patient not taking: Reported on 09/01/2016) 12 tablet 0 Not Taking at Unknown time  . pantoprazole (PROTONIX) 40 MG tablet Take 40 mg by mouth daily.   Past Month at Unknown time  . PROAIR HFA 108 (90 BASE) MCG/ACT inhaler Inhale 1-2 puffs into the lungs every 6 (six) hours as needed for wheezing or shortness of breath.   0 prn at prn  . QUEtiapine (SEROQUEL) 300 MG tablet Take 1 tablet (300 mg total) by mouth at bedtime. (Patient taking differently: Take 400 mg by mouth at bedtime. ) 30 tablet 0 01/03/2017 at Unknown time  . QUEtiapine (SEROQUEL) 50 MG tablet Take 1 tablet (50 mg total) by mouth daily at 12 noon. (Patient taking differently: Take 50 mg by mouth 2 (two) times daily. ) 30 tablet 0 prn at prn  . thiamine 50 MG tablet Take 1 tablet (50 mg total) by mouth daily. 30 tablet 0 Not Taking at Unknown time    Patient Stressors: Financial difficulties Health problems Medication change or noncompliance Substance abuse  Patient Strengths: Ability for insight Capable of independent living Motivation for treatment/growth Supportive family/friends  Treatment Modalities: Medication Management, Group therapy, Case management,  1 to 1 session with clinician, Psychoeducation, Recreational therapy.   Physician Treatment Plan for Primary Diagnosis: Bipolar I disorder, most recent episode depressed with anxious distress ([place]) Long Term Goal(s): Improvement in symptoms so as ready for discharge Improvement in symptoms so as ready for discharge   Short Term Goals: Ability to identify changes in lifestyle to reduce recurrence of condition will improve Ability to verbalize feelings will improve Ability to disclose and discuss suicidal ideas Ability to demonstrate self-control will improve Ability to identify and develop effective coping behaviors will improve Ability to maintain clinical  measurements within normal limits will improve Compliance with prescribed medications will improve Ability to identify triggers associated with substance abuse/mental health issues will improve Ability to identify changes in lifestyle to reduce recurrence of condition will improve Ability to demonstrate self-control will improve Ability to identify triggers associated with substance abuse/mental health issues will improve  Medication Management: Evaluate patient's response, side effects, and tolerance of medication regimen.  Therapeutic Interventions: 1 to 1 sessions, Unit Group sessions and Medication administration.  Evaluation of Outcomes: Progressing  Physician Treatment Plan for Secondary Diagnosis: Principal Problem:   Bipolar I disorder, most recent episode depressed with anxious distress ([place]) Active Problems:   HTN (hypertension)   Alcohol use disorder, moderate, dependence ([place])  Cannabis use disorder, moderate, dependence ([facility])   Tobacco use disorder   Dyslipidemia   Barrett's esophagus  Long Term Goal(s): Improvement in symptoms so as ready for discharge Improvement in symptoms so as ready for discharge   Short Term Goals: Ability to identify changes in lifestyle to reduce recurrence of condition will improve Ability to verbalize feelings will improve Ability to disclose and discuss suicidal ideas Ability to demonstrate self-control will improve Ability to identify and develop effective coping behaviors will improve Ability to maintain clinical measurements within normal limits will improve Compliance with prescribed medications will improve Ability to identify triggers associated with substance abuse/mental health issues will improve Ability to identify changes in lifestyle to reduce recurrence of condition will improve Ability to demonstrate self-control will improve Ability to identify triggers associated with substance abuse/mental health issues will improve      Medication Management: Evaluate patient's response, side effects, and tolerance of medication regimen.  Therapeutic Interventions: 1 to 1 sessions, Unit Group sessions and Medication administration.  Evaluation of Outcomes: Progressing   RN Treatment Plan for Primary Diagnosis: Bipolar I disorder, most recent episode depressed with anxious distress ([place]) Long Term Goal(s): Knowledge of disease and therapeutic regimen to maintain health will improve  Short Term Goals: Ability to demonstrate self-control, Ability to participate in decision making will improve, Ability to verbalize feelings will improve and Ability to identify and develop effective coping behaviors will improve  Medication Management: RN will administer medications as ordered by provider, will assess and evaluate patient's response and provide education to patient for prescribed medication. RN will report any adverse and/or side effects to prescribing provider.  Therapeutic Interventions: 1 on 1 counseling sessions, Psychoeducation, Medication administration, Evaluate responses to treatment, Monitor vital signs and CBGs as ordered, Perform/monitor CIWA, COWS, AIMS and Fall Risk screenings as ordered, Perform wound care treatments as ordered.  Evaluation of Outcomes: Progressing   LCSW Treatment Plan for Primary Diagnosis: Bipolar I disorder, most recent episode depressed with anxious distress ([place]) Long Term Goal(s): Safe transition to appropriate next level of care at discharge, Engage patient in therapeutic group addressing interpersonal concerns.  Short Term Goals: Engage patient in aftercare planning with referrals and resources, Increase social support, Increase ability to appropriately verbalize feelings, Increase emotional regulation, Facilitate acceptance of mental health diagnosis and concerns, Facilitate patient progression through stages of change regarding substance use diagnoses and concerns, Identify triggers  associated with mental health/substance abuse issues and Increase skills for wellness and recovery  Therapeutic Interventions: Assess for all discharge needs, 1 to 1 time with Social worker, Explore available resources and support systems, Assess for adequacy in community support network, Educate family and significant other(s) on suicide prevention, Complete Psychosocial Assessment, Interpersonal group therapy.  Evaluation of Outcomes: Progressing   Progress in Treatment: Attending groups: Yes. Participating in groups: Yes. Taking medication as prescribed: Yes. Toleration medication: Yes. Family/Significant other contact made: No, will contact:  sister Patient understands diagnosis: Yes. Discussing patient identified problems/goals with staff: Yes. Medical problems stabilized or resolved: Yes. Denies suicidal/homicidal ideation: Yes. Issues/concerns per patient self-inventory: No. Other: n/a  New problem(s) identified: None identified at this time.   New Short Term/Long Term Goal(s): None identified at this time.   Discharge Plan or Barriers: Patient will discharge home and follow-up with [facility] for outpatient services.   Reason for Continuation of Hospitalization: Anxiety Depression Medication stabilization  Estimated Length of Stay: 3 to 5 days.   Attendees: Patient: Ian [name] 01/09/2017 1:47 PM  Physician:  Dr. Orson Slick, MD 01/09/2017 1:47 PM  Nursing: Polly Cobia, RN 01/09/2017 1:47 PM  RN Care Manager: 01/09/2017 1:47 PM  Social Worker: Jen Mow. Satira Sark 01/09/2017 1:47 PM  Recreational Therapist: Leonette Monarch, LRT/CTRS 01/09/2017 1:47 PM  Other:  01/09/2017 1:47 PM  Other:  01/09/2017 1:47 PM  Other: 01/09/2017 1:47 PM    Scribe for Treatment Team: Jolaine Click, [place] 01/09/2017 2:02 PM

## 2017-01-09 NOTE — Plan of Care (Signed)
Problem: Coping: Goal: Ability to cope will improve Outcome: Progressing Continue to work on [employer]

## 2017-01-09 NOTE — [facility] Suicide Risk Assessment (Signed)
[facility] INPATIENT:  Family/Significant Other Suicide Prevention Education  Suicide Prevention Education:  Education Completed; Sandy Orcutt(sister [phone]), has been identified by the patient as the family member/significant other with whom the patient will be residing, and identified as the person(s) who will aid the patient in the event of a mental health crisis (suicidal ideations/suicide attempt).  With written consent from the patient, the family member/significant other has been provided the following suicide prevention education, prior to the and/or following the discharge of the patient.  The suicide prevention education provided includes the following:  Suicide risk factors  Suicide prevention and interventions  National Suicide Hotline telephone number  [facility] assessment telephone number  [facility] Emergency Assistance [place] and/or Residential Mobile Crisis Unit telephone number  Request made of family/significant other to:  Remove weapons (e.g., guns, rifles, knives), all items previously/currently identified as safety concern.    Remove drugs/medications (over-the-counter, prescriptions, illicit drugs), all items previously/currently identified as a safety concern.  The family member/significant other verbalizes understanding of the suicide prevention education information provided.  The family member/significant other agrees to remove the items of safety concern listed above.  Thaddeaus Monica G. Kellogg, [place] 01/09/2017, 2:56 PM

## 2017-01-10 DIAGNOSIS — I4581 Long QT syndrome: Secondary | ICD-10-CM

## 2017-01-10 NOTE — [facility] Group Notes (Signed)
1/20/201812:58 PM  Type of Therapy: Group Therapy  Participation Level: Active  Participation Quality: Appropriate  Affect: Appropriate  Cognitive: Alert  Insight: Developing/Improving  Engagement in Therapy: Improving  Modes of Intervention: Clarification, Discussion, Education, Limit-setting, Problem-solving, Reality Testing, Socialization and Support  Summary of Progress/Problems: Balance in life: Patients will discuss the concept of balance and how it looks and feels to be unbalanced. Pt will identify areas in their life that is unbalanced and ways to become more balanced. They discussed what aspects in their lives has influenced their self care.Patients also discussed self care in the areas of self regulation/control, hygiene/appearance, sleep/relaxation, healthy leisure, healthy eating habits, exercise, inner peace/spirituality, self improvement, sobriety, and health management.They were challenged to identify changes that are needed in order to improve self care. Patient made excellent contributions and supported his peers. Patient was very attentive and engaged. He did leave early for medications and consult.  Numa Schroeter M. Pauletta Browns, Marlinda Mike 01/10/2017

## 2017-01-10 NOTE — Progress Notes (Signed)
[facility] MD Progress Note  01/10/2017 11:38 AM Ian [name]  MRN:  [id number]  Subjective:   01/08/2017 Ian [name] still feels severely depressed and suicidal. He did not sleep last night and again requests increase in the dose of Seroquel. We will lower her Seroquel to 50 mg twice daily and 200 at bedtime as the worries about elongated QTC. We are still awaiting daily EKG. I'll discontinue the Lexapro as it may also prolong QTC and started Wellbutrin today. We will offer Restoril at night. The patient denies somatic symptoms today. He went to morning group.  01/09/2017 Ian [name] still complains of sever depression, suicidal thinking and severe anxiety. He slept 6 hours last night. Daily EKG is unremarkable with QTC 430, no change from initial evaluation in a patient with a history of torsades and on antipsychotic.He definitely down playes substance use and is not interested in treatment. He is asking to increase Seroquel to 300 mg nightly. Will do.  01/10/2017  Ian [name] still complains of depression and severe anxiety. He had a panic attack yesterday and was really worried that there were no when necessary's prescribed. He was able to walk it off. There is some difficulty describing the antianxiety medication for this patient given his history of prolonged QTC. I do not want to prescribe benzodiazepines given his history of alcoholism and substance abuse. We started clonidine 0.1 mg as needed. It is interesting that in nursing assessment he graduated his anxiety only 3 in 10. He does not sleep well even though I increase Seroquel to his requested dose, in spite of worries about QTC. He participates in some groups. Tolerates medications well. No somatic complains. Continue EKG monitoring.  Per nursing: D: Patient is alert and oriented on the unit this shift. Patient attended and   participated in groups today. Patient denies suicidal ideation, homicidal ideation, auditory or visual hallucinations at the  present time.  A: Scheduled medications are administered to patient as per MD orders. Emotional support and encouragement are provided. Patient is maintained on q.15 minute safety checks. Patient is informed to notify staff with questions or concerns. R: No adverse medication reactions are noted. Patient is cooperative with medication administration and treatment plan today. Patient is receptive, anxious and cooperative on the unit at this time. Patient interacts  with others on the unit this shift. Patient contracts for safety at this time. Patient remains safe at this time. Anxiety 3/10 Depression 6/10  Principal Problem: Bipolar I disorder, most recent episode depressed with anxious distress ([place]) Diagnosis:   Patient Active Problem List   Diagnosis Date Noted  . Dyslipidemia [E78.5] 01/07/2017  . Barrett's esophagus [K22.70] 01/07/2017  . Bipolar I disorder, most recent episode depressed with anxious distress ([place]) [F31.30] 01/06/2017  . Cannabis use disorder, moderate, dependence ([place]) [F12.20] 01/06/2017  . Tobacco use disorder [F17.200] 01/06/2017  . Alcohol use disorder, moderate, dependence ([place]) [F10.20] 10/06/2016  . Substance induced mood disorder ([place]) [F19.94] 10/06/2016  . Cocaine use disorder, moderate, dependence ([place]) [F14.20] 10/06/2016  . Depression [F32.9] 10/06/2016  . Acute left-sided weakness [M62.89] 05/03/2015  . CVA (cerebral infarction) [I63.9] 05/03/2015  . Weakness [R53.1] 11/16/2013  . Chest pain [R07.9] 11/16/2013  . HTN (hypertension) [I10] 11/16/2013  . Left-sided weakness [R53.1] 11/16/2013  . Torsades de pointes ([place]) [I47.2] 11/16/2013  . Hemiplegia, unspecified, affecting nondominant side [G81.90] 11/15/2013  . Speech and language deficits [F80.9, R47.9] 11/15/2013   Total Time spent with patient: 20 minutes  Past Psychiatric History: depression.  Past Medical History:  Past Medical History:  Diagnosis Date  . Anxiety   . Barrett esophagus    . Cancer ([place])   . Coronary artery disease   . Depression   . GERD (gastroesophageal reflux disease)   . Hypertension   . Stroke [facility]) 2014   "mini-stroke" per patient    Past Surgical History:  Procedure Laterality Date  . ANGIOPLASTY    . APPENDECTOMY    . CARDIAC CATHETERIZATION    . CHOLECYSTECTOMY    . WRIST SURGERY Right    age 56   Family History:  Family History  Problem Relation Age of Onset  . Stroke Father   . Leukemia Father   . Breast cancer Sister    Family Psychiatric  History: see H&P. Social History:  History  Alcohol Use  . Yes    Comment: denies being a daily drinker but did have alcohol today     History  Drug Use  . Types: Cocaine, Marijuana    Comment: used cocaine today; denies smoking marijuana for years    Social History   Social History  . Marital status: Single    Spouse name: N/A  . Number of children: N/A  . Years of education: N/A   Social History Main Topics  . Smoking status: Current Every Day Smoker    Packs/day: 1.00    Types: Cigarettes  . Smokeless tobacco: Never Used  . Alcohol use Yes     Comment: denies being a daily drinker but did have alcohol today  . Drug use: Yes    Types: Cocaine, Marijuana     Comment: used cocaine today; denies smoking marijuana for years  . Sexual activity: Not Asked   Other Topics Concern  . None   Social History Narrative  . None   Additional Social History:                         Sleep: Poor  Appetite:  Good  Current Medications: Current Facility-Administered Medications  Medication Dose Route Frequency Provider Last Rate Last Dose  . acetaminophen (TYLENOL) tablet 650 mg  650 mg Oral Q6H PRN [name], MD   650 mg at 01/08/17 1432  . albuterol (PROVENTIL HFA;VENTOLIN HFA) 108 (90 Base) MCG/ACT inhaler 1 puff  1 puff Inhalation Q6H PRN Clovis Fredrickson, MD   1 puff at 01/08/17 2141  . alum & mag hydroxide-simeth (MAALOX/MYLANTA) 200-200-20 MG/5ML  suspension 30 mL  30 mL Oral Q4H PRN [name], MD   30 mL at 01/09/17 2023  . aspirin EC tablet 81 mg  81 mg Oral Daily Jolanta B Pucilowska, MD   81 mg at 01/10/17 0815  . atorvastatin (LIPITOR) tablet 40 mg  40 mg Oral q1800 Jolanta B Pucilowska, MD   40 mg at 01/09/17 1716  . buPROPion (WELLBUTRIN XL) 24 hr tablet 150 mg  150 mg Oral Daily Jolanta B Pucilowska, MD   150 mg at 01/10/17 0815  . lisinopril (PRINIVIL,ZESTRIL) tablet 10 mg  10 mg Oral Daily Clovis Fredrickson, MD   10 mg at 01/10/17 0815  . magnesium hydroxide (MILK OF MAGNESIA) suspension 30 mL  30 mL Oral Daily PRN [name], MD      . nicotine (NICODERM CQ - dosed in mg/24 hours) patch 21 mg  21 mg Transdermal Daily Jolanta B Pucilowska, MD   21 mg at 01/10/17 0818  . QUEtiapine (SEROQUEL XR) 24  hr tablet 300 mg  300 mg Oral QHS Clovis Fredrickson, MD   300 mg at 01/09/17 2203  . QUEtiapine (SEROQUEL XR) 24 hr tablet 50 mg  50 mg Oral BH-q7a Clovis Fredrickson, MD   50 mg at 01/10/17 0815  . QUEtiapine (SEROQUEL XR) 24 hr tablet 50 mg  50 mg Oral QAC lunch Jolanta B Pucilowska, MD   50 mg at 01/10/17 1133    Lab Results:  No results found for this or any previous visit (from the past 48 hour(s)).  Blood Alcohol level:  Lab Results  Component Value Date   ETH <5 01/06/2017   ETH 111 (H) 123456    Metabolic Disorder Labs: Lab Results  Component Value Date   HGBA1C 5.2 01/07/2017   MPG 103 01/07/2017   MPG 103 11/16/2013   No results found for: PROLACTIN Lab Results  Component Value Date   CHOL 153 01/07/2017   TRIG 155 (H) 01/07/2017   HDL 36 (L) 01/07/2017   CHOLHDL 4.3 01/07/2017   VLDL 31 01/07/2017   LDLCALC 86 01/07/2017   LDLCALC 75 09/02/2016    Physical Findings: AIMS:  , ,  ,  ,    CIWA:    COWS:     Musculoskeletal: Strength & Muscle Tone: within normal limits Gait & Station: normal Patient leans: N/A  Psychiatric Specialty Exam: Physical Exam  Nursing note and vitals  reviewed. Psychiatric: His speech is normal and behavior is normal. His mood appears anxious. Cognition and memory are normal. He expresses impulsivity. He exhibits a depressed mood. He expresses suicidal ideation. He expresses suicidal plans.    Review of Systems  Respiratory: Positive for shortness of breath.   Psychiatric/Behavioral: Positive for depression, substance abuse and suicidal ideas. The patient is nervous/anxious and has insomnia.   All other systems reviewed and are negative.   Blood pressure 116/72, pulse 76, temperature 98.1 F (36.7 C), temperature source Oral, resp. rate 18, height 6' (1.829 m), weight 109.8 kg (242 lb), SpO2 98 %.Body mass index is 32.82 kg/m.  General Appearance: Casual  Eye Contact:  Good  Speech:  Clear and Coherent  Volume:  Normal  Mood:  Anxious and Depressed  Affect:  Labile and Tearful  Thought Process:  Goal Directed and Descriptions of Associations: Intact  Orientation:  Full (Time, Place, and Person)  Thought Content:  WDL and Logical  Suicidal Thoughts:  Yes.  with intent/plan  Homicidal Thoughts:  No  Memory:  Immediate;   Fair Recent;   Fair Remote;   Fair  Judgement:  Impaired  Insight:  Shallow  Psychomotor Activity:  Restlessness  Concentration:  Concentration: Fair and Attention Span: Fair  Recall:  [employer] of Knowledge:  Fair  Language:  Fair  Akathisia:  No  Handed:  Right  AIMS (if indicated):     Assets:  Communication Skills Desire for Improvement Financial Resources/Insurance Housing Resilience  ADL's:  Intact  Cognition:  WNL  Sleep:  Number of Hours: 6     Treatment Plan Summary: Daily contact with patient to assess and evaluate symptoms and progress in treatment and Medication management   Mr. Tressel is a 56 year old male with history of depression, anxiety, mood instability and alcoholism admitted for suicidal ideation and heightened anxiety in the context of medication changes and discontinuation of  alcohol.  1. Suicidal ideation. The patient is able to contract for safety in the hospital.  2. Mood. For the past 5 years the patient  has been maintained on a combination of Seroquel, Celexa, and clonazepam. Clonazepam has been gradually discontinued by his primary psychiatrist. The patient has a history of torsades in 2014. His QTC this morning was 430 but it was 588 just few days ago. In reviewing his older EKGs I see that his QTC is usually around 440. The patient is demanding to put him back on Seroquel. He understands the risks. I will continue Seroquel 50 mg at breakfast and lunch and increase evening dose to 300 mg. I discontinued Celexa and started Wellbutrin as it does not affect QTC. I will continue daily EKG.   3. Alcoholism. He stopped drinking 2 days ago. We completed Librium taper.  4. Substance abuse treatment. The patient refuses long-term treatment. He will follow up with [facility].  5. Hypertension. He is on lisinopril.  6. Dyslipidemia. He is on Crestor.  7. Barrett's esophagitis. He is on Protonix.   8. Metabolic syndrome monitoring. Lipid profile, TSH and HgbA1C are normal.    9. COPD. Albuterol is available.  10. Insomnia. I will discontinue Restoril.    11. Disposition. He will be discharged to home. He will follow-up with [facility].  Orson Slick, MD 01/10/2017, 11:38 AM

## 2017-01-10 NOTE — Plan of Care (Signed)
Problem: Coping: Goal: Ability to cope will improve Outcome: Progressing Working on coping skills   

## 2017-01-10 NOTE — Progress Notes (Signed)
D: Patient stated slept poor last night .Stated appetite is fair and energy level  Is normal. Stated concentration is good . Stated on Depression scale 5, hopeless 3 and anxiety 5.( low 0-10 high) Denies suicidal  homicidal ideations  .  No auditory hallucinations  No pain concerns . Appropriate ADL'S. Interacting with peers and staff.  Voice of being agitated. A: Encourage patient participation with unit programming . Instruction  Given on  Medication , verbalize understanding. R: Voice no other concerns. Staff continue to monitor

## 2017-01-10 NOTE — Progress Notes (Signed)
D: Pt passive SI-contracts for safety denies HI/AVH. Pt is pleasant and cooperative. Pt stated he was feeling very anxious all day. Pt appears to want help , but has closed minded think about things. When things to help him with his anxiety were mentioned, pt stated they won't work.  Pt seen on unit interacting with peers.   A: Pt was offered support and encouragement. Pt was given scheduled medications. Pt was encourage to attend groups. Q 15 minute checks were done for safety.   R:Pt attends groups and interacts well with peers and staff. Pt is taking medication. Pt receptive to treatment and safety maintained on unit.

## 2017-01-10 NOTE — Plan of Care (Signed)
Problem: Safety: Goal: Periods of time without injury will increase Outcome: Progressing Pt safe on the unit at this time   

## 2017-01-10 NOTE — Progress Notes (Signed)
D: Patient is alert and oriented on the unit this shift. Patient attended and   participated in groups today. Patient denies suicidal ideation, homicidal ideation, auditory or visual hallucinations at the present time.  A: Scheduled medications are administered to patient as per MD orders. Emotional support and encouragement are provided. Patient is maintained on q.15 minute safety checks. Patient is informed to notify staff with questions or concerns. R: No adverse medication reactions are noted. Patient is cooperative with medication administration and treatment plan today. Patient is receptive, anxious and cooperative on the unit at this time. Patient interacts  with others on the unit this shift. Patient contracts for safety at this time. Patient remains safe at this time. Anxiety 3/10 Depression 6/10

## 2017-01-10 NOTE — Plan of Care (Signed)
Problem: Coping: Goal: Ability to verbalize frustrations and anger appropriately will improve Outcome: Not Progressing Patient not able to verbalize frustrations at this time [place] RN   

## 2017-01-11 DIAGNOSIS — I4581 Long QT syndrome: Secondary | ICD-10-CM

## 2017-01-11 MED ORDER — MELATONIN 5 MG PO CAPS
10.0000 mg | ORAL_CAPSULE | Freq: Every day | ORAL | Status: DC
  Administered 2017-01-11: 10 mg via ORAL
  Filled 2017-01-11: qty 2

## 2017-01-11 MED ORDER — NON FORMULARY: 10.0000 mg | Freq: Every day | Status: DC

## 2017-01-11 MED ORDER — CLONIDINE HCL 0.1 MG PO TABS
0.1000 mg | ORAL_TABLET | Freq: Three times a day (TID) | ORAL | Status: DC | PRN
  Administered 2017-01-11 – 2017-01-12 (×3): 0.1 mg via ORAL
  Filled 2017-01-11 (×4): qty 1

## 2017-01-11 MED ORDER — MELATONIN 10 MG PO CAPS: 10.0000 mg | ORAL_CAPSULE | Freq: Every day | ORAL | Status: DC

## 2017-01-11 NOTE — [facility] Group Notes (Signed)
[place] LCSW Group Therapy  01/11/2017 12:24 PM  Type of Therapy:  Group Therapy  Participation Level:  Patient did not attend group. CSW invited patient to group.   Summary of Progress/Problems: Emotional Regulation: Patients will identify both negative and positive emotions. They will discuss emotions they have difficulty regulating and how they impact their lives. Patients will be asked to identify healthy coping skills to combat unhealthy reactions to negative emotions.    Chesney Suares M. Pauletta Browns, Marlinda Mike 01/11/2017

## 2017-01-11 NOTE — [facility] Group Notes (Signed)
[place] Group Notes:  (Nursing/MHT/Case Management/Adjunct)  Date:  01/11/2017  Time:  2:53 AM  Type of Therapy:  Psychoeducational Skills  Participation Level:  Active  Participation Quality:  Appropriate  Affect:  Appropriate  Cognitive:  Appropriate  Insight:  Appropriate  Engagement in Group:  Engaged  Modes of Intervention:  Discussion, Socialization and Support  Summary of Progress/Problems:  Reece Agar 01/11/2017, 2:53 AM

## 2017-01-11 NOTE — Progress Notes (Signed)
Pt awake, alert, oriented and up on unit. Appropriately interacts with staff/peers. Observed socializing in dayroom with staff/peers. Reports fair sleep last night without the help of medications. Reports good appetite, hyper energy, poor concentration. Rates depression 5/10. Hopelessness 5/10, anxiety 8/10 (low 0-10 high). Reports having suicidal thoughts sometimes, reporting "just thoughts." Verbally agrees to consent for safety here in the hospital. Complains of Right sided chest/abdomen pain, refused to take pain medication, but did request PRN anxiety and indigestion medication as ordered, see MAR.   Support and encouragement provided with use of therapeutic communication. Medications administered as ordered with education. Safety maintained with every 15 minute checks. Will continue to monitor.

## 2017-01-11 NOTE — Progress Notes (Signed)
[facility] MD Progress Note  01/11/2017 10:27 AM Ian [name]  MRN:  [id number]  Subjective:   01/08/2017 Ian [name] still feels severely depressed and suicidal. He did not sleep last night and again requests increase in the dose of Seroquel. We will lower her Seroquel to 50 mg twice daily and 200 at bedtime as the worries about elongated QTC. We are still awaiting daily EKG. I'll discontinue the Lexapro as it may also prolong QTC and started Wellbutrin today. We will offer Restoril at night. The patient denies somatic symptoms today. He went to morning group.  01/09/2017 Ian [name] still complains of sever depression, suicidal thinking and severe anxiety. He slept 6 hours last night. Daily EKG is unremarkable with QTC 430, no change from initial evaluation in a patient with a history of torsades and on antipsychotic.He definitely down playes substance use and is not interested in treatment. He is asking to increase Seroquel to 300 mg nightly. Will do.  01/10/2017  Ian [name] still complains of depression and severe anxiety. He had a panic attack yesterday and was really worried that there were no when necessary's prescribed. He was able to walk it off. There is some difficulty describing the antianxiety medication for this patient given his history of prolonged QTC. I do not want to prescribe benzodiazepines given his history of alcoholism and substance abuse. We started clonidine 0.1 mg as needed. It is interesting that in nursing assessment he graduated his anxiety only 3 in 10. He does not sleep well even though I increase Seroquel to his requested dose, in spite of worries about QTC. He participates in some groups. Tolerates medications well. No somatic complains. Continue EKG monitoring.  01/11/2017. Ian [name] still feels suicidal especially during panic attacks that he experiences here daily. Depression is getting better. He slept only 3 hours last night despite of his assurances that 300 of Seroquel  does the trick. I do not want to further increase Seroquel dose QTC prolongation. We'll give melatonin as he mostly complains of difficulties falling asleep. We will continue all other medications. We will add clonidine 0.1 mg 3 times daily as needed for anxiety. I do not want to give him benzos as he is an alcoholic. Vistaril also extends QTC.  Per nursing: D: Patient is alert and oriented on the unit this shift. Patient attended and   participated in groups today. Patient denies suicidal ideation, homicidal ideation, auditory or visual hallucinations at the present time.  A: Scheduled medications are administered to patient as per MD orders. Emotional support and encouragement are provided. Patient is maintained on q.15 minute safety checks. Patient is informed to notify staff with questions or concerns. R: No adverse medication reactions are noted. Patient is cooperative with medication administration and treatment plan today. Patient is receptive, anxious and cooperative on the unit at this time. Patient interacts  with others on the unit this shift. Patient contracts for safety at this time. Patient remains safe at this time. Anxiety 3/10 Depression 6/10  Principal Problem: Bipolar I disorder, most recent episode depressed with anxious distress ([place]) Diagnosis:   Patient Active Problem List   Diagnosis Date Noted  . Dyslipidemia [E78.5] 01/07/2017  . Barrett's esophagus [K22.70] 01/07/2017  . Bipolar I disorder, most recent episode depressed with anxious distress ([place]) [F31.30] 01/06/2017  . Cannabis use disorder, moderate, dependence ([place]) [F12.20] 01/06/2017  . Tobacco use disorder [F17.200] 01/06/2017  . Alcohol use disorder, moderate, dependence ([place]) [F10.20] 10/06/2016  . Substance induced mood  disorder ([place]) [F19.94] 10/06/2016  . Cocaine use disorder, moderate, dependence ([place]) [F14.20] 10/06/2016  . Depression [F32.9] 10/06/2016  . Acute left-sided weakness [M62.89] 05/03/2015  .  CVA (cerebral infarction) [I63.9] 05/03/2015  . Weakness [R53.1] 11/16/2013  . Chest pain [R07.9] 11/16/2013  . HTN (hypertension) [I10] 11/16/2013  . Left-sided weakness [R53.1] 11/16/2013  . Torsades de pointes ([place]) [I47.2] 11/16/2013  . Hemiplegia, unspecified, affecting nondominant side [G81.90] 11/15/2013  . Speech and language deficits [F80.9, R47.9] 11/15/2013   Total Time spent with patient: 20 minutes  Past Psychiatric History: depression.  Past Medical History:  Past Medical History:  Diagnosis Date  . Anxiety   . Barrett esophagus   . Cancer ([place])   . Coronary artery disease   . Depression   . GERD (gastroesophageal reflux disease)   . Hypertension   . Stroke [facility]) 2014   "mini-stroke" per patient    Past Surgical History:  Procedure Laterality Date  . ANGIOPLASTY    . APPENDECTOMY    . CARDIAC CATHETERIZATION    . CHOLECYSTECTOMY    . WRIST SURGERY Right    age 14   Family History:  Family History  Problem Relation Age of Onset  . Stroke Father   . Leukemia Father   . Breast cancer Sister    Family Psychiatric  History: see H&P. Social History:  History  Alcohol Use  . Yes    Comment: denies being a daily drinker but did have alcohol today     History  Drug Use  . Types: Cocaine, Marijuana    Comment: used cocaine today; denies smoking marijuana for years    Social History   Social History  . Marital status: Single    Spouse name: N/A  . Number of children: N/A  . Years of education: N/A   Social History Main Topics  . Smoking status: Current Every Day Smoker    Packs/day: 1.00    Types: Cigarettes  . Smokeless tobacco: Never Used  . Alcohol use Yes     Comment: denies being a daily drinker but did have alcohol today  . Drug use: Yes    Types: Cocaine, Marijuana     Comment: used cocaine today; denies smoking marijuana for years  . Sexual activity: Not Asked   Other Topics Concern  . None   Social History Narrative  . None    Additional Social History:                         Sleep: Poor  Appetite:  Good  Current Medications: Current Facility-Administered Medications  Medication Dose Route Frequency Provider Last Rate Last Dose  . acetaminophen (TYLENOL) tablet 650 mg  650 mg Oral Q6H PRN [name], MD   650 mg at 01/08/17 1432  . albuterol (PROVENTIL HFA;VENTOLIN HFA) 108 (90 Base) MCG/ACT inhaler 1 puff  1 puff Inhalation Q6H PRN Clovis Fredrickson, MD   1 puff at 01/10/17 2201  . alum & mag hydroxide-simeth (MAALOX/MYLANTA) 200-200-20 MG/5ML suspension 30 mL  30 mL Oral Q4H PRN [name], MD   30 mL at 01/10/17 1408  . aspirin EC tablet 81 mg  81 mg Oral Daily Clovis Fredrickson, MD   81 mg at 01/11/17 0821  . atorvastatin (LIPITOR) tablet 40 mg  40 mg Oral q1800 Dianah Pruett B Keigan Girten, MD   40 mg at 01/10/17 1716  . buPROPion (WELLBUTRIN XL) 24 hr tablet 150 mg  150 mg Oral Daily Clovis Fredrickson, MD   150 mg at 01/11/17 [id number]  . cloNIDine (CATAPRES) tablet 0.1 mg  0.1 mg Oral TID PRN Raegan Sipp B Kinsler Soeder, MD      . lisinopril (PRINIVIL,ZESTRIL) tablet 10 mg  10 mg Oral Daily Clovis Fredrickson, MD   10 mg at 01/11/17 0821  . magnesium hydroxide (MILK OF MAGNESIA) suspension 30 mL  30 mL Oral Daily PRN [name], MD      . nicotine (NICODERM CQ - dosed in mg/24 hours) patch 21 mg  21 mg Transdermal Daily Jadie Comas B Johari Pinney, MD   21 mg at 01/11/17 0824  . NON FORMULARY 10 mg  10 mg Oral QHS Nala Kachel B Aj Crunkleton, MD      . QUEtiapine (SEROQUEL XR) 24 hr tablet 300 mg  300 mg Oral QHS Marzella Miracle B Dmonte Maher, MD   300 mg at 01/10/17 2200  . QUEtiapine (SEROQUEL XR) 24 hr tablet 50 mg  50 mg Oral BH-q7a Clovis Fredrickson, MD   50 mg at 01/11/17 0821  . QUEtiapine (SEROQUEL XR) 24 hr tablet 50 mg  50 mg Oral QAC lunch Dajha Urquilla B Neyah Ellerman, MD   50 mg at 01/10/17 1133    Lab Results:  No results found for this or any previous visit (from the past 48 hour(s)).  Blood Alcohol  level:  Lab Results  Component Value Date   ETH <5 01/06/2017   ETH 111 (H) [id number]    Metabolic Disorder Labs: Lab Results  Component Value Date   HGBA1C 5.2 01/07/2017   MPG 103 01/07/2017   MPG 103 11/16/2013   No results found for: PROLACTIN Lab Results  Component Value Date   CHOL 153 01/07/2017   TRIG 155 (H) 01/07/2017   HDL 36 (L) 01/07/2017   CHOLHDL 4.3 01/07/2017   VLDL 31 01/07/2017   LDLCALC 86 01/07/2017   LDLCALC 75 09/02/2016    Physical Findings: AIMS:  , ,  ,  ,    CIWA:    COWS:     Musculoskeletal: Strength & Muscle Tone: within normal limits Gait & Station: normal Patient leans: N/A  Psychiatric Specialty Exam: Physical Exam  Nursing note and vitals reviewed. Psychiatric: His speech is normal and behavior is normal. His mood appears anxious. Cognition and memory are normal. He expresses impulsivity. He exhibits a depressed mood. He expresses suicidal ideation. He expresses suicidal plans.    Review of Systems  Respiratory: Positive for shortness of breath.   Psychiatric/Behavioral: Positive for depression, substance abuse and suicidal ideas. The patient is nervous/anxious and has insomnia.   All other systems reviewed and are negative.   Blood pressure 113/84, pulse 80, temperature 98.3 F (36.8 C), temperature source Oral, resp. rate 18, height 6' (1.829 m), weight 109.8 kg (242 lb), SpO2 97 %.Body mass index is 32.82 kg/m.  General Appearance: Casual  Eye Contact:  Good  Speech:  Clear and Coherent  Volume:  Normal  Mood:  Anxious and Depressed  Affect:  Labile and Tearful  Thought Process:  Goal Directed and Descriptions of Associations: Intact  Orientation:  Full (Time, Place, and Person)  Thought Content:  WDL and Logical  Suicidal Thoughts:  Yes.  with intent/plan  Homicidal Thoughts:  No  Memory:  Immediate;   Fair Recent;   Fair Remote;   Fair  Judgement:  Impaired  Insight:  Shallow  Psychomotor Activity:  Restlessness   Concentration:  Concentration: Fair and Attention Span: Fair  Recall:  Smiley [place]man of Knowledge:  Fair  Language:  Fair  Akathisia:  No  Handed:  Right  AIMS (if indicated):     Assets:  Communication Skills Desire for Improvement Financial Resources/Insurance Housing Resilience  ADL's:  Intact  Cognition:  WNL  Sleep:  Number of Hours: 3     Treatment Plan Summary: Daily contact with patient to assess and evaluate symptoms and progress in treatment and Medication management   Mr. Bartram is a 56 year old male with history of depression, anxiety, mood instability and alcoholism admitted for suicidal ideation and heightened anxiety in the context of medication changes and discontinuation of alcohol.  1. Suicidal ideation. The patient is able to contract for safety in the hospital.  2. Mood. For the past 5 years the patient has been maintained on a combination of Seroquel, Celexa, and clonazepam. Clonazepam has been gradually discontinued by his primary psychiatrist. The patient has a history of torsades in 2014. His QTC this morning was 430 but it was 588 just few days ago. In reviewing his older EKGs I see that his QTC is usually around 440. The patient is demanding to put him back on Seroquel. He understands the risks. I will continue Seroquel 50 mg at breakfast and lunch and increase evening dose to 300 mg. I discontinued Celexa and started Wellbutrin as it does not affect QTC. I will continue daily EKG.   3. Alcoholism. He stopped drinking 2 days ago. He completed Librium taper.  4. Substance abuse treatment. The patient refuses long-term treatment. He will follow up with [facility].  5. Hypertension. He is on lisinopril.  6. Dyslipidemia. He is on Crestor.  7. Barrett's esophagitis. He is on Protonix.   8. Metabolic syndrome monitoring. Lipid profile, TSH and HgbA1C are normal.    9. COPD. Albuterol is available.  10. Insomnia. We started Melatonin.    11.  Anxiety. He reports "anxiety attacks" that end up in suicidal thinking. Will give Clonidine 0.1 mg as needed.  12. Disposition. He will be discharged to home. He will follow-up with [facility].  Orson Slick, MD 01/11/2017, 10:27 AM

## 2017-01-11 NOTE — Plan of Care (Signed)
Problem: Coping: Goal: Ability to verbalize feelings will improve Outcome: Progressing Pt voices he continues to have suicidal thoughts sometimes, "just thoughts." Verbally contracts for safety.

## 2017-01-12 MED ORDER — QUETIAPINE FUMARATE ER 300 MG PO TB24
300.0000 mg | ORAL_TABLET | Freq: Every day | ORAL | Status: DC
  Administered 2017-01-12: 300 mg via ORAL
  Filled 2017-01-12: qty 1

## 2017-01-12 MED ORDER — QUETIAPINE FUMARATE ER 50 MG PO TB24
50.0000 mg | ORAL_TABLET | Freq: Two times a day (BID) | ORAL | Status: DC
  Administered 2017-01-12 – 2017-01-13 (×2): 50 mg via ORAL
  Filled 2017-01-12 (×2): qty 1

## 2017-01-12 MED ORDER — MELATONIN 5 MG PO TABS
10.0000 mg | ORAL_TABLET | Freq: Every day | ORAL | Status: DC
  Administered 2017-01-12: 10 mg via ORAL
  Filled 2017-01-12: qty 2

## 2017-01-12 MED FILL — Melatonin Tab 5 MG: ORAL | Qty: 2 | Status: AC

## 2017-01-12 NOTE — [facility] Group Notes (Signed)
[place] LCSW Group Therapy Note  Date/Time: 9:30am, 01/12/2017  Type of Therapy and Topic:  Group Therapy:  Overcoming Obstacles  Participation Level:  ACTIVE  Description of Group:    In this group patients will be encouraged to explore what they see as obstacles to their own wellness and recovery. They will be guided to discuss their thoughts, feelings, and behaviors related to these obstacles. The group will process together ways to cope with barriers, with attention given to specific choices patients can make. Each patient will be challenged to identify changes they are motivated to make in order to overcome their obstacles. This group will be process-oriented, with patients participating in exploration of their own experiences as well as giving and receiving support and challenge from other group members.  Therapeutic Goals: 1. Patient will identify personal and current obstacles as they relate to admission. 2. Patient will identify barriers that currently interfere with their wellness or overcoming obstacles.  3. Patient will identify feelings, thought process and behaviors related to these barriers. 4. Patient will identify two changes they are willing to make to overcome these obstacles:    Summary of Patient Progress   Pt able to meet therapeutic goals listed above and able to process what these obstacles may be like at discharge.  Pt identifies things that may need to change prior to overcoming obstacles.   Therapeutic Modalities:   Cognitive Behavioral Therapy Solution Focused Therapy Motivational Interviewing Relapse Prevention Therapy  Dossie Arbour, MSW, LCSW

## 2017-01-12 NOTE — Progress Notes (Signed)
Patient stated he could sleep good last night.Patient denies suicidal or homicidal ideations and AV hallucinations.Appropriate with staff & peers.Compliant with medications.Attended groups.Appetite & energy level good.Support & encouragement given.

## 2017-01-12 NOTE — Progress Notes (Signed)
[facility] MD Progress Note  01/12/2017 11:47 AM Ian [name]  MRN:  [id number]  Subjective: Ian [name] is a 56 year old male with a history of severe depression, mood instability, anxiety, and alcohol abuse. The patient has a long history of mental illness and is disabled from. He has been maintained successfully for the past 5 years on a combination of Lexapro, Seroquel, and clonazepam. He follows up with [facility] and has been compliant with medications but they recently started tapering off his clonazepam. The patient has been also drinking some but not heavily with his last drink on Friday over the holidays the patient became increasingly depressed for sleep, decreased appetite, anhedonia, being of guilt and hopelessness worthlessness, poor energy and concentration, social isolation, and crying spells. He started thinking of suicide. His anxiety went through the roof. His mind is racing and he feels shaky and sweaty all the time and wants to die. He also started worrying about Barrett esophagus and convince himself that he is gravely ill. He went to the emergency room few days ago. He denies psychotic symptoms or symptoms suggestive of bipolar mania. He reports severe social anxiety and frequent panic attacks he denies PTSD symptoms. He is a [employer]. He has been drinking alcohol and smoking marijuana but decided to stop. 01/08/2017 Ian [name] still feels severely depressed and suicidal. He did not sleep last night and again requests increase in the dose of Seroquel. We will lower her Seroquel to 50 mg twice daily and 200 at bedtime as the worries about elongated QTC. We are still awaiting daily EKG. I'll discontinue the Lexapro as it may also prolong QTC and started Wellbutrin today. We will offer Restoril at night. The patient denies somatic symptoms today. He went to morning group.  01/09/2017 Ian [name] still complains of sever depression, suicidal thinking and severe anxiety. He slept 6 hours last night.  Daily EKG is unremarkable with QTC 430, no change from initial evaluation in a patient with a history of torsades and on antipsychotic.He definitely down playes substance use and is not interested in treatment. He is asking to increase Seroquel to 300 mg nightly. Will do.  01/10/2017  Ian [name] still complains of depression and severe anxiety. He had a panic attack yesterday and was really worried that there were no when necessary's prescribed. He was able to walk it off. There is some difficulty describing the antianxiety medication for this patient given his history of prolonged QTC. I do not want to prescribe benzodiazepines given his history of alcoholism and substance abuse. We started clonidine 0.1 mg as needed. It is interesting that in nursing assessment he graduated his anxiety only 3 in 10. He does not sleep well even though I increase Seroquel to his requested dose, in spite of worries about QTC. He participates in some groups. Tolerates medications well. No somatic complains. Continue EKG monitoring.  01/11/2017. Ian [name] still feels suicidal especially during panic attacks that he experiences here daily. Depression is getting better. He slept only 3 hours last night despite of his assurances that 300 of Seroquel does the trick. I do not want to further increase Seroquel dose QTC prolongation. We'll give melatonin as he mostly complains of difficulties falling asleep. We will continue all other medications. We will add clonidine 0.1 mg 3 times daily as needed for anxiety. I do not want to give him benzos as he is an alcoholic. Vistaril also extends QTC.  1/22  nursing feels a little bit better today. He greatly  his mood as a 5 out of 10. He denies having passive suicidal ideation or suicidal ideation. He also denies homicidality or hallucinations. He says that last night was the first night that he was able to sleep well. He looks like he received melatonin at bedtime. He denies having any  problems with appetite but struggles with poor energy poor concentration. He denies any side effects from medications. He says he also received medication for anxiety that was helpful. Per records appears that he has been receiving clonidine when necessary for anxiety.  Patient on multiple requests today about his medications. He says that at home he was taking Seroquel immediate release during the daytime and Seroquel extended release at bedtime. He says he takes 50 mg of Seroquel immediate release with breakfast and then again with lunch. He takes 300 mg of Seroquel extended release around 7:00pm. He will likely need to change the medications back to what he was taking at home. He would like to continue with melatonin and clonidine as he felt they were beneficial.    Per nursing: Endorses Passive SI. Contracts for safety. Visible in milieu interacting appropriately with peers and staff. Attended evening group. Pt stated he is hopeful that melatonin will help him sleep better. Voices no additional concerns at this time. Safety maintained. Will continue to monitor.  Principal Problem: Bipolar I disorder, most recent episode depressed with anxious distress ([place]) Diagnosis:   Patient Active Problem List   Diagnosis Date Noted  . Dyslipidemia [E78.5] 01/07/2017  . Barrett's esophagus [K22.70] 01/07/2017  . Bipolar I disorder, most recent episode depressed with anxious distress ([place]) [F31.30] 01/06/2017  . Cannabis use disorder, moderate, dependence ([place]) [F12.20] 01/06/2017  . Tobacco use disorder [F17.200] 01/06/2017  . Alcohol use disorder, moderate, dependence ([place]) [F10.20] 10/06/2016  . Substance induced mood disorder ([place]) [F19.94] 10/06/2016  . Cocaine use disorder, moderate, dependence ([place]) [F14.20] 10/06/2016  . Depression [F32.9] 10/06/2016  . Acute left-sided weakness [M62.89] 05/03/2015  . CVA (cerebral infarction) [I63.9] 05/03/2015  . Weakness [R53.1] 11/16/2013  . Chest pain  [R07.9] 11/16/2013  . HTN (hypertension) [I10] 11/16/2013  . Left-sided weakness [R53.1] 11/16/2013  . Torsades de pointes ([place]) [I47.2] 11/16/2013  . Hemiplegia, unspecified, affecting nondominant side [G81.90] 11/15/2013  . Speech and language deficits [F80.9, R47.9] 11/15/2013   Total Time spent with patient: 20 minutes  Past Psychiatric History: depression.  Past Medical History:  Past Medical History:  Diagnosis Date  . Anxiety   . Barrett esophagus   . Cancer ([place])   . Coronary artery disease   . Depression   . GERD (gastroesophageal reflux disease)   . Hypertension   . Stroke [facility]) 2014   "mini-stroke" per patient    Past Surgical History:  Procedure Laterality Date  . ANGIOPLASTY    . APPENDECTOMY    . CARDIAC CATHETERIZATION    . CHOLECYSTECTOMY    . WRIST SURGERY Right    age 68   Family History:  Family History  Problem Relation Age of Onset  . Stroke Father   . Leukemia Father   . Breast cancer Sister    Family Psychiatric  History: see H&P. Social History:  History  Alcohol Use  . Yes    Comment: denies being a daily drinker but did have alcohol today     History  Drug Use  . Types: Cocaine, Marijuana    Comment: used cocaine today; denies smoking marijuana for years    Social History   Social  History  . Marital status: Single    Spouse name: N/A  . Number of children: N/A  . Years of education: N/A   Social History Main Topics  . Smoking status: Current Every Day Smoker    Packs/day: 1.00    Types: Cigarettes  . Smokeless tobacco: Never Used  . Alcohol use Yes     Comment: denies being a daily drinker but did have alcohol today  . Drug use: Yes    Types: Cocaine, Marijuana     Comment: used cocaine today; denies smoking marijuana for years  . Sexual activity: Not Asked   Other Topics Concern  . None   Social History Narrative  . None     Current Medications: Current Facility-Administered Medications  Medication Dose Route  Frequency Provider Last Rate Last Dose  . acetaminophen (TYLENOL) tablet 650 mg  650 mg Oral Q6H PRN [name], MD   650 mg at 01/11/17 1735  . albuterol (PROVENTIL HFA;VENTOLIN HFA) 108 (90 Base) MCG/ACT inhaler 1 puff  1 puff Inhalation Q6H PRN Clovis Fredrickson, MD   1 puff at 01/11/17 2202  . alum & mag hydroxide-simeth (MAALOX/MYLANTA) 200-200-20 MG/5ML suspension 30 mL  30 mL Oral Q4H PRN [name], MD   30 mL at 01/11/17 1607  . aspirin EC tablet 81 mg  81 mg Oral Daily Jolanta B Pucilowska, MD   81 mg at 01/12/17 0813  . atorvastatin (LIPITOR) tablet 40 mg  40 mg Oral q1800 Jolanta B Pucilowska, MD   40 mg at 01/11/17 1735  . buPROPion (WELLBUTRIN XL) 24 hr tablet 150 mg  150 mg Oral Daily Jolanta B Pucilowska, MD   150 mg at 01/12/17 0813  . cloNIDine (CATAPRES) tablet 0.1 mg  0.1 mg Oral TID PRN Clovis Fredrickson, MD   0.1 mg at 01/11/17 2159  . lisinopril (PRINIVIL,ZESTRIL) tablet 10 mg  10 mg Oral Daily Clovis Fredrickson, MD   10 mg at 01/12/17 0813  . magnesium hydroxide (MILK OF MAGNESIA) suspension 30 mL  30 mL Oral Daily PRN [name], MD      . Melatonin TABS 10 mg  10 mg Oral QHS Jolanta B Pucilowska, MD      . nicotine (NICODERM CQ - dosed in mg/24 hours) patch 21 mg  21 mg Transdermal Daily Clovis Fredrickson, MD   21 mg at 01/12/17 0812  . QUEtiapine (SEROQUEL XR) 24 hr tablet 300 mg  300 mg Oral QHS Hildred Priest, MD      . QUEtiapine (SEROQUEL XR) 24 hr tablet 50 mg  50 mg Oral BID Hildred Priest, MD        Lab Results:  No results found for this or any previous visit (from the past 48 hour(s)).  Blood Alcohol level:  Lab Results  Component Value Date   ETH <5 01/06/2017   ETH 111 (H) 123456    Metabolic Disorder Labs: Lab Results  Component Value Date   HGBA1C 5.2 01/07/2017   MPG 103 01/07/2017   MPG 103 11/16/2013   No results found for: PROLACTIN Lab Results  Component Value Date   CHOL 153 01/07/2017    TRIG 155 (H) 01/07/2017   HDL 36 (L) 01/07/2017   CHOLHDL 4.3 01/07/2017   VLDL 31 01/07/2017   LDLCALC 86 01/07/2017   LDLCALC 75 09/02/2016    Physical Findings: AIMS:  , ,  ,  ,    CIWA:    COWS:  Musculoskeletal: Strength & Muscle Tone: within normal limits Gait & Station: normal Patient leans: N/A  Psychiatric Specialty Exam: Physical Exam  Nursing note and vitals reviewed. Constitutional: He is oriented to person, place, and time. He appears well-developed and well-nourished.  HENT:  Head: Normocephalic and atraumatic.  Eyes: EOM are normal.  Neck: Normal range of motion.  Respiratory: Effort normal.  Musculoskeletal: Normal range of motion.  Neurological: He is alert and oriented to person, place, and time.  Psychiatric: His speech is normal and behavior is normal. His mood appears anxious. Cognition and memory are normal. He expresses impulsivity. He exhibits a depressed mood. He expresses suicidal ideation. He expresses suicidal plans.    Review of Systems  Constitutional: Negative.   HENT: Negative.   Eyes: Negative.   Respiratory: Negative.  Negative for shortness of breath.   Cardiovascular: Negative.   Gastrointestinal: Negative.   Genitourinary: Negative.   Musculoskeletal: Negative.   Skin: Negative.   Neurological: Negative.   Endo/Heme/Allergies: Negative.   Psychiatric/Behavioral: Positive for depression and substance abuse. Negative for hallucinations, memory loss and suicidal ideas. The patient is nervous/anxious. The patient does not have insomnia.   All other systems reviewed and are negative.   Blood pressure 132/68, pulse 74, temperature 98.5 F (36.9 C), resp. rate 18, height 6' (1.829 m), weight 109.8 kg (242 lb), SpO2 97 %.Body mass index is 32.82 kg/m.  General Appearance: Casual  Eye Contact:  Good  Speech:  Clear and Coherent  Volume:  Normal  Mood:  Anxious and Depressed  Affect:  Congruent  Thought Process:  Goal Directed  and Descriptions of Associations: Intact  Orientation:  Full (Time, Place, and Person)  Thought Content:  WDL and Logical  Suicidal Thoughts:  No  Homicidal Thoughts:  No  Memory:  Immediate;   Fair Recent;   Fair Remote;   Fair  Judgement:  Impaired  Insight:  Shallow  Psychomotor Activity:  Restlessness  Concentration:  Concentration: Fair and Attention Span: Fair  Recall:  [employer] of Knowledge:  Fair  Language:  Fair  Akathisia:  No  Handed:  Right  AIMS (if indicated):     Assets:  Communication Skills Desire for Improvement Financial Resources/Insurance Housing Resilience  ADL's:  Intact  Cognition:  WNL  Sleep:  Number of Hours: 6.15     Treatment Plan Summary: Daily contact with patient to assess and evaluate symptoms and progress in treatment and Medication management   Ian [name] is a 56 year old male with history of depression, anxiety, mood instability and alcoholism admitted for suicidal ideation and heightened anxiety in the context of medication changes and discontinuation of alcohol.  1. Suicidal ideation. The patient is able to contract for safety in the hospital.  2. Mood. For the past 5 years the patient has been maintained on a combination of Seroquel, Celexa, and clonazepam. Clonazepam has been gradually discontinued by his primary psychiatrist. The patient has a history of torsades in 2014. His QTC this morning was 430 but it was 588 just few days ago. In reviewing his older EKGs I see that his QTC is usually around 440. The patient is demanding to put him back on Seroquel. He understands the risks. I will continue Seroquel 50 mg at breakfast and lunch and increase evening dose to 300 mg. I discontinued Celexa and started Wellbutrin as it does not affect QTC. I will continue daily EKG.   3. Alcoholism. He stopped drinking 2 days ago. He completed Librium taper.  4. Substance abuse treatment. The patient refuses long-term treatment. He will follow  up with [facility].  5. Hypertension. He is on lisinopril.  6. Dyslipidemia. He is on Crestor.  7. Barrett's esophagitis. He is on Protonix.   8. Metabolic syndrome monitoring. Lipid profile, TSH and HgbA1C are normal.    9. COPD. Albuterol is available.  10. Insomnia: continue melatonin as pt had good response  11. Anxiety. He reports "anxiety attacks" that end up in suicidal thinking. Will give Clonidine 0.1 mg as needed.  Pt reports clonidine has helped  12. Disposition. He will be discharged to home. He will follow-up with [facility].  1/22 EKG from 1/21 shows nl QTC.  Pt had multiple request today Seroquel 50 mg with breakfast and lunch will be changed to immediate release as pt says it works better for his anxiety.  He will be continued on seroquel 300 mg XR but will give earlier,at 7 to help with sleep issues.      Hildred Priest, MD 01/12/2017, 11:47 AM

## 2017-01-12 NOTE — Progress Notes (Signed)
Endorses Passive SI. Contracts for safety. Visible in milieu interacting appropriately with peers and staff. Attended evening group. Pt stated he is hopeful that melatonin will help him sleep better. Voices no additional concerns at this time. Safety maintained. Will continue to monitor.

## 2017-01-12 NOTE — Plan of Care (Signed)
Problem: Safety: Goal: Periods of time without injury will increase Outcome: Progressing Pt has remained free from injury.

## 2017-01-12 NOTE — Progress Notes (Signed)
Recreation Therapy Notes  Date: 01.22.18 Time: 1:00 pm Location: Craft Room  Group Topic: Wellness  Goal Area(s) Addresses:  Patient will identify at least one item per dimension of health. Patient will examine areas they are deficient in.  Behavioral Response: Attentive, Interactive  Intervention: 6 Dimensions of Health  Activity: Patients were given a definition sheet with the 6 dimensions of health. Patients were given a worksheet with each dimension listed and were instructed to write at least one item they were currently doing in each dimension. LRT encouraged patients to write 2-3 items if they could.  Education: LRT educated patients on ways to improve each dimension.  Education Outcome: Acknowledges education/In group clarification offered  Clinical Observations/Feedback: Patient wrote items in most dimensions. Patient contributed to group discussion by stating what areas he was giving enough attention to and what areas he was not giving enough attention to.  Leonette Monarch, LRT/CTRS 01/12/2017 3:37 PM

## 2017-01-12 NOTE — [facility] Group Notes (Signed)
[facility] Group Notes:  (Nursing/MHT/Case Management/Adjunct)  Date:  01/12/2017  Time:  9:25 PM  Type of Therapy:  Psychoeducational Skills  Participation Level:  Active  Participation Quality:  Appropriate  Affect:  Appropriate  Cognitive:  Appropriate  Insight:  Good  Engagement in Group:  Engaged  Modes of Intervention:  Activity  Summary of Progress/Problems:  Ian [name] 01/12/2017, 9:25 PM

## 2017-01-13 DIAGNOSIS — I4581 Long QT syndrome: Secondary | ICD-10-CM

## 2017-01-13 MED ORDER — QUETIAPINE FUMARATE 50 MG PO TABS: 50.0000 mg | ORAL_TABLET | Freq: Two times a day (BID) | ORAL | 1 refills | Status: DC

## 2017-01-13 MED ORDER — BUPROPION HCL ER (XL) 150 MG PO TB24: 150.0000 mg | ORAL_TABLET | Freq: Every day | ORAL | 1 refills | Status: DC

## 2017-01-13 MED ORDER — MELATONIN 5 MG PO TABS: 10.0000 mg | ORAL_TABLET | Freq: Every day | ORAL | 0 refills | Status: DC

## 2017-01-13 MED ORDER — ASPIRIN 81 MG PO TBEC: 81.0000 mg | DELAYED_RELEASE_TABLET | Freq: Every day | ORAL | 0 refills | Status: DC

## 2017-01-13 MED ORDER — QUETIAPINE FUMARATE ER 50 MG PO TB24: 50.0000 mg | ORAL_TABLET | Freq: Two times a day (BID) | ORAL | 1 refills | Status: DC

## 2017-01-13 MED ORDER — LISINOPRIL 10 MG PO TABS: 10.0000 mg | ORAL_TABLET | Freq: Every day | ORAL | 0 refills | Status: DC

## 2017-01-13 MED ORDER — QUETIAPINE FUMARATE ER 300 MG PO TB24: 300.0000 mg | ORAL_TABLET | Freq: Every day | ORAL | 1 refills | Status: DC

## 2017-01-13 MED ORDER — ATORVASTATIN CALCIUM 40 MG PO TABS: 40.0000 mg | ORAL_TABLET | Freq: Every day | ORAL | 0 refills | Status: DC

## 2017-01-13 NOTE — Discharge Summary (Signed)
Physician Discharge Summary Note  Patient:  Ian [name] is an 56 y.o., male MRN:  [id number] DOB:  Aug 29, 1961 Patient phone:  [phone] (home)  Patient address:   [address] [place] [address],  Total Time spent with patient: 30 minutes  Date of Admission:  01/06/2017 Date of Discharge: 01/13/2017  Reason for Admission:  Suicidal ideation.  Identifying data. Ian [name] is a 56 year old male with a history of severe depression, mood instability, anxiety, and alcohol abuse.  Chief complaint. "I am freaking out."  History of present illness. Information was obtained from the patient and the chart. The patient has a long history of mental illness and is disabled from. He has been maintained successfully for the past 5 years on a combination of Lexapro, Seroquel, and clonazepam. He follows up with [facility] and has been compliant with medications but they recently started tapering off his clonazepam. The patient has been also drinking some but not heavily with his last drink on Friday over the holidays the patient became increasingly depressed for sleep, decreased appetite, anhedonia, being of guilt and hopelessness worthlessness, poor energy and concentration, social isolation, and crying spells. He started thinking of suicide. His anxiety went through the roof. His mind is racing and he feels shaky and sweaty all the time and wants to die. He also started worrying about Barrett esophagus and convince himself that he is gravely ill. He went to the emergency room few days ago. He denies psychotic symptoms or symptoms suggestive of bipolar mania. He reports severe social anxiety and frequent panic attacks he denies PTSD symptoms. He is a Ship broker. He has been drinking alcohol and smoking marijuana but decided to stop.  Past psychiatric history. As there were 3 or 4 prior psychiatric hospital patient's [facility] for bipolar and substance abuse. 5 years ago he was  referred to [facility] where he was hospitalized for a month, put on the right medications, and treated for alcoholism. His drinking has never been as bad as before since. He admits to suicide attempts in the past.  Family psychiatric history. None reported.  Social history. He is disabled from mental illness he lives in law alone in her dilapidated house. He is a Ship broker. He has never been married and has no children. He is estranged from his family.  Principal Problem: Bipolar I disorder, most recent episode depressed with anxious distress [facility]) Discharge Diagnoses: Patient Active Problem List   Diagnosis Date Noted  . Dyslipidemia [E78.5] 01/07/2017  . Barrett's esophagus [K22.70] 01/07/2017  . Bipolar I disorder, most recent episode depressed with anxious distress ([place]) [F31.30] 01/06/2017  . Cannabis use disorder, moderate, dependence ([place]) [F12.20] 01/06/2017  . Tobacco use disorder [F17.200] 01/06/2017  . Alcohol use disorder, moderate, dependence ([place]) [F10.20] 10/06/2016  . Substance induced mood disorder ([place]) [F19.94] 10/06/2016  . Cocaine use disorder, moderate, dependence ([place]) [F14.20] 10/06/2016  . Depression [F32.9] 10/06/2016  . Acute left-sided weakness [M62.89] 05/03/2015  . CVA (cerebral infarction) [I63.9] 05/03/2015  . Weakness [R53.1] 11/16/2013  . Chest pain [R07.9] 11/16/2013  . HTN (hypertension) [I10] 11/16/2013  . Left-sided weakness [R53.1] 11/16/2013  . Torsades de pointes ([place]) [I47.2] 11/16/2013  . Hemiplegia, unspecified, affecting nondominant side [G81.90] 11/15/2013  . Speech and language deficits [F80.9, R47.9] 11/15/2013    Past Medical History:  Past Medical History:  Diagnosis Date  . Anxiety   . Barrett esophagus   . Cancer ([place])   . Coronary artery disease   .  Depression   . GERD (gastroesophageal reflux disease)   . Hypertension   . Stroke [facility]) 2014   "mini-stroke" per patient    Past Surgical History:  Procedure Laterality Date  .  ANGIOPLASTY    . APPENDECTOMY    . CARDIAC CATHETERIZATION    . CHOLECYSTECTOMY    . WRIST SURGERY Right    age 62   Family History:  Family History  Problem Relation Age of Onset  . Stroke Father   . Leukemia Father   . Breast cancer Sister    Social History:  History  Alcohol Use  . Yes    Comment: denies being a daily drinker but did have alcohol today     History  Drug Use  . Types: Cocaine, Marijuana    Comment: used cocaine today; denies smoking marijuana for years    Social History   Social History  . Marital status: Single    Spouse name: N/A  . Number of children: N/A  . Years of education: N/A   Social History Main Topics  . Smoking status: Current Every Day Smoker    Packs/day: 1.00    Types: Cigarettes  . Smokeless tobacco: Never Used  . Alcohol use Yes     Comment: denies being a daily drinker but did have alcohol today  . Drug use: Yes    Types: Cocaine, Marijuana     Comment: used cocaine today; denies smoking marijuana for years  . Sexual activity: Not Asked   Other Topics Concern  . None   Social History Narrative  . None    Hospital Course:    Ian [name] is a 56 year old male with history of depression, anxiety, mood instability and alcoholism admitted for suicidal ideation and heightened anxiety in the context of medication changes and discontinuation of alcohol.  1. Suicidal ideation. Resolved. The patient is able to contract for safety. He is forward thinking and more optimistic about the future.  2. Mood. For the past 5 years the patient has been maintained on a combination of Seroquel, Celexa, and clonazepam. Clonazepam has been gradually discontinued by his primary psychiatrist. The patient has a history of torsades in 2014. The patient is demanding to put him back on Seroquel. He understands the risks. I gave Seroquel 50 mg at breakfast and lunch and Seroquel XR 300 mg at bedtime. Daily EKG did not reveal QTC prolongation. I  discontinued Celexa and started Wellbutrin as it does not affect QTC.  3. Alcoholism. He stopped drinking 2 days ago. He completed Librium taper.  4. Substance abuse treatment. The patient refuses long-term treatment. He will follow up with [facility].  5. Hypertension. He is on lisinopril.  6. Dyslipidemia. He is on Crestor.  7. Barrett's esophagitis. He is on Protonix.   8. Metabolic syndrome monitoring. Lipid profile, TSH and HgbA1C are normal.    9. COPD. Albuterol is available.  10. Insomnia. Melatonin was offered.   11. Anxiety. He reports "anxiety attacks" that are helped with Seroquel.   12. Smoking. Nicotine patch was available.   13. Disposition. He was discharged to home. He will follow-up with [facility].   Physical Findings: AIMS:  , ,  ,  ,    CIWA:    COWS:     Musculoskeletal: Strength & Muscle Tone: within normal limits Gait & Station: normal Patient leans: N/A  Psychiatric Specialty Exam: Physical Exam  Nursing note and vitals reviewed. Psychiatric: His speech is normal and behavior is normal. Thought content  normal. His mood appears anxious. Cognition and memory are normal. He expresses impulsivity.    Review of Systems  Psychiatric/Behavioral: Positive for substance abuse. The patient is nervous/anxious.   All other systems reviewed and are negative.   Blood pressure 109/76, pulse 79, temperature 97.8 F (36.6 C), temperature source Oral, resp. rate 18, height 6' (1.829 m), weight 109.8 kg (242 lb), SpO2 98 %.Body mass index is 32.82 kg/m.  General Appearance: Casual  Eye Contact:  Good  Speech:  Clear and Coherent  Volume:  Normal  Mood:  Anxious  Affect:  Appropriate  Thought Process:  Goal Directed and Descriptions of Associations: Intact  Orientation:  Full (Time, Place, and Person)  Thought Content:  WDL  Suicidal Thoughts:  No  Homicidal Thoughts:  No  Memory:  Immediate;   Fair Recent;   Fair Remote;   Fair  Judgement:   Impaired  Insight:  Shallow  Psychomotor Activity:  Normal  Concentration:  Concentration: Fair and Attention Span: Fair  Recall:  [employer] of Knowledge:  Fair  Language:  Fair  Akathisia:  No  Handed:  Right  AIMS (if indicated):     Assets:  Communication Skills Desire for Improvement Financial Resources/Insurance Housing Physical Health Resilience Social Support  ADL's:  Intact  Cognition:  WNL  Sleep:  Number of Hours: 6.15     Have you used any form of tobacco in the last 30 days? (Cigarettes, Smokeless Tobacco, Cigars, and/or Pipes): Yes  Has this patient used any form of tobacco in the last 30 days? (Cigarettes, Smokeless Tobacco, Cigars, and/or Pipes) Yes, Yes, A prescription for an FDA-approved tobacco cessation medication was offered at discharge and the patient refused  Blood Alcohol level:  Lab Results  Component Value Date   ETH <5 01/06/2017   ETH 111 (H) 123456    Metabolic Disorder Labs:  Lab Results  Component Value Date   HGBA1C 5.2 01/07/2017   MPG 103 01/07/2017   MPG 103 11/16/2013   No results found for: PROLACTIN Lab Results  Component Value Date   CHOL 153 01/07/2017   TRIG 155 (H) 01/07/2017   HDL 36 (L) 01/07/2017   CHOLHDL 4.3 01/07/2017   VLDL 31 01/07/2017   LDLCALC 86 01/07/2017   [place] 75 09/02/2016    See Psychiatric Specialty Exam and Suicide Risk Assessment completed by Attending Physician prior to discharge.  Discharge destination:  Home  Is patient on multiple antipsychotic therapies at discharge:  No   Has Patient had three or more failed trials of antipsychotic monotherapy by history:  No  Recommended Plan for Multiple Antipsychotic Therapies: NA  Discharge Instructions    Diet - low sodium heart healthy    Complete by:  As directed    Increase activity slowly    Complete by:  As directed      Allergies as of 01/13/2017      Reactions   Fluoxetine Swelling   Anxiety, itching    Hydrocodone-acetaminophen Itching      Medication List    STOP taking these medications   acetaminophen-codeine 300-30 MG tablet Commonly known as:  TYLENOL #3   citalopram 20 MG tablet Commonly known as:  CELEXA   clonazePAM 0.5 MG tablet Commonly known as:  KLONOPIN   famotidine 20 MG tablet Commonly known as:  PEPCID   gabapentin 100 MG capsule Commonly known as:  NEURONTIN   hydrOXYzine 50 MG tablet Commonly known as:  ATARAX/VISTARIL   ondansetron 4 MG  tablet Commonly known as:  ZOFRAN   oxyCODONE-acetaminophen 5-325 MG tablet Commonly known as:  ROXICET   pantoprazole 40 MG tablet Commonly known as:  PROTONIX   PROAIR HFA 108 (90 Base) MCG/ACT inhaler Generic drug:  albuterol   QUEtiapine 300 MG tablet Commonly known as:  SEROQUEL Replaced by:  QUEtiapine 300 MG 24 hr tablet You also have another medication with the same name that you need to continue taking as instructed.   thiamine 50 MG tablet     TAKE these medications     Indication  aspirin 81 MG EC tablet Take 1 tablet (81 mg total) by mouth daily.  Indication:  Inflammation   atorvastatin 40 MG tablet Commonly known as:  LIPITOR Take 1 tablet (40 mg total) by mouth daily at 6 PM.  Indication:  High Amount of Fats in the Blood   buPROPion 150 MG 24 hr tablet Commonly known as:  WELLBUTRIN XL Take 1 tablet (150 mg total) by mouth daily.  Indication:  Major Depressive Disorder   calcium carbonate 500 MG chewable tablet Commonly known as:  TUMS Chew 2 tablets (400 mg of elemental calcium total) by mouth 3 (three) times daily as needed for indigestion or heartburn.  Indication:  Acid Indigestion   lisinopril 10 MG tablet Commonly known as:  PRINIVIL,ZESTRIL Take 1 tablet (10 mg total) by mouth daily.  Indication:  High Blood Pressure Disorder   Melatonin 5 MG Tabs Take 2 tablets (10 mg total) by mouth at bedtime.  Indication:  Trouble Sleeping   QUEtiapine 300 MG 24 hr  tablet Commonly known as:  SEROQUEL XR Take 1 tablet (300 mg total) by mouth at bedtime. What changed:  You were already taking a medication with the same name, and this prescription was added. Make sure you understand how and when to take each. Replaces:  QUEtiapine 300 MG tablet  Indication:  Manic-Depression   QUEtiapine 50 MG tablet Commonly known as:  SEROQUEL Take 1 tablet (50 mg total) by mouth 2 (two) times daily. What changed:  when to take this  Another medication with the same name was removed. Continue taking this medication, and follow the directions you see here.  Indication:  Depressive Phase of Manic-Depression      Follow-up Information    [facility]. Go in 7 day(s).   Why:  Follow-up within 7 days of discharge for outpatient services. Walk-in hours are M-F 8a-4p. Bring insurance card. photo I.D., discharge summary, and current medications with you to first appointment. Arrive early to ensure prompt service.  Contact information: [address] [place],  [address] Phone:[phone] Fax:[phone]           Follow-up recommendations:  Activity:  as tolerated. Diet:  low sodium heart healthy. Other:  keep follow up appointments.   Comments:     Signed: Orson Slick, MD 01/13/2017, 9:06 AM

## 2017-01-13 NOTE — Progress Notes (Signed)
Patient discharged home. DC instructions proved and explained. Medications reviewed. Rx given. All questions answered. Pt stable at discharge. Denies SI, HI, AVH. Belongings returned. Transition and risk assessment given with discharge instructions.

## 2017-01-13 NOTE — BHH Suicide Risk Assessment (Signed)
[facility] Discharge Suicide Risk Assessment   Principal Problem: Bipolar I disorder, most recent episode depressed with anxious distress [facility]) Discharge Diagnoses:  Patient Active Problem List   Diagnosis Date Noted  . Dyslipidemia [E78.5] 01/07/2017  . Barrett's esophagus [K22.70] 01/07/2017  . Bipolar I disorder, most recent episode depressed with anxious distress ([place]) [F31.30] 01/06/2017  . Cannabis use disorder, moderate, dependence ([place]) [F12.20] 01/06/2017  . Tobacco use disorder [F17.200] 01/06/2017  . Alcohol use disorder, moderate, dependence ([place]) [F10.20] 10/06/2016  . Substance induced mood disorder ([place]) [F19.94] 10/06/2016  . Cocaine use disorder, moderate, dependence ([place]) [F14.20] 10/06/2016  . Depression [F32.9] 10/06/2016  . Acute left-sided weakness [M62.89] 05/03/2015  . CVA (cerebral infarction) [I63.9] 05/03/2015  . Weakness [R53.1] 11/16/2013  . Chest pain [R07.9] 11/16/2013  . HTN (hypertension) [I10] 11/16/2013  . Left-sided weakness [R53.1] 11/16/2013  . Torsades de pointes ([place]) [I47.2] 11/16/2013  . Hemiplegia, unspecified, affecting nondominant side [G81.90] 11/15/2013  . Speech and language deficits [F80.9, R47.9] 11/15/2013    Total Time spent with patient: 30 minutes  Musculoskeletal: Strength & Muscle Tone: within normal limits Gait & Station: normal Patient leans: N/A  Psychiatric Specialty Exam: Review of Systems  All other systems reviewed and are negative.   Blood pressure 109/76, pulse 79, temperature 97.8 F (36.6 C), temperature source Oral, resp. rate 18, height 6' (1.829 m), weight 109.8 kg (242 lb), SpO2 98 %.Body mass index is 32.82 kg/m.  General Appearance: Casual  Eye Contact::  Fair  Speech:  Clear and Coherent409  Volume:  Normal  Mood:  Anxious  Affect:  Appropriate  Thought Process:  Goal Directed and Descriptions of Associations: Intact  Orientation:  Full (Time, Place, and Person)  Thought Content:  WDL  Suicidal  Thoughts:  No  Homicidal Thoughts:  No  Memory:  Immediate;   Fair Recent;   Fair Remote;   Fair  Judgement:  Impaired  Insight:  Shallow  Psychomotor Activity:  Normal  Concentration:  Fair  Recall:  [place]  Language: Fair  Akathisia:  No  Handed:  Right  AIMS (if indicated):     Assets:  Communication Skills Desire for Improvement Financial Resources/Insurance Housing Physical Health Resilience Social Support  Sleep:  Number of Hours: 6.15  Cognition: WNL  ADL's:  Intact   Mental Status Per Nursing Assessment::   On Admission:  Suicide plan, Intention to act on suicide plan, Belief that plan would result in death  Demographic Factors:  Male, Divorced or widowed, Caucasian and Living alone  Loss Factors: Financial problems/change in socioeconomic status  Historical Factors: Prior suicide attempts, Family history of mental illness or substance abuse and Impulsivity  Risk Reduction Factors:   Sense of responsibility to family  Continued Clinical Symptoms:  Bipolar Disorder:   Depressive phase Depression:   Comorbid alcohol abuse/dependence Impulsivity Alcohol/Substance Abuse/Dependencies  Cognitive Features That Contribute To Risk:  None    Suicide Risk:  Minimal: No identifiable suicidal ideation.  Patients presenting with no risk factors but with morbid ruminations; may be classified as minimal risk based on the severity of the depressive symptoms  Follow-up [facility]. Go in 7 day(s).   Why:  Follow-up within 7 days of discharge for outpatient services. Walk-in hours are M-F 8a-4p. Bring insurance card. photo I.D., discharge summary, and current medications with you to first appointment. Arrive early to ensure prompt service.  Contact information: [address] [place], [place] [address] Phone:[phone]  Fax:[phone]           Plan Of CarAs tolerated.ollow-up recommendations:   Activity:  As tolerated. Diet:  Low sodium heart healthy. Other:  Keep follow-up appointments.  Orson Slick, MD 01/13/2017, 8:39 AM

## 2017-01-13 NOTE — Plan of Care (Signed)
Problem: Safety: Goal: Ability to disclose and discuss suicidal ideas will improve Outcome: Progressing Denies SI/HI/AVH today. Reports improved anxiety with help of clonidine ordered PRN, see MAR.

## 2017-01-13 NOTE — BHH Group Notes (Signed)
[facility] Group Notes:  (Nursing/MHT/Case Management/Adjunct) *Late Entry for 01/12/17 Date:  01/13/2017  Time:  9:12 AM  Type of Therapy:  Psychoeducational Skills  Participation Level:  Active  Participation Quality:  Appropriate, Attentive and Sharing  Affect:  Appropriate  Cognitive:  Alert and Appropriate  Insight:  Appropriate  Engagement in Group:  Engaged  Modes of Intervention:  Discussion, Education and Support  Summary of Progress/Problems:  Ian [name] 01/13/2017, 9:12 AM

## 2017-01-13 NOTE — Tx Team (Signed)
Interdisciplinary Treatment and Diagnostic Plan Update  01/13/2017 Time of Session: 10:50am Ian [name] MRN: [id number]  Principal Diagnosis: Bipolar I disorder, most recent episode depressed with anxious distress ([place])  Secondary Diagnoses: Principal Problem:   Bipolar I disorder, most recent episode depressed with anxious distress ([place]) Active Problems:   HTN (hypertension)   Alcohol use disorder, moderate, dependence ([place])   Cannabis use disorder, moderate, dependence ([place])   Tobacco use disorder   Dyslipidemia   Barrett's esophagus   Current Medications:  Current Facility-Administered Medications  Medication Dose Route Frequency Provider Last Rate Last Dose  . acetaminophen (TYLENOL) tablet 650 mg  650 mg Oral Q6H PRN [name], MD   650 mg at 01/11/17 1735  . albuterol (PROVENTIL HFA;VENTOLIN HFA) 108 (90 Base) MCG/ACT inhaler 1 puff  1 puff Inhalation Q6H PRN Clovis Fredrickson, MD   1 puff at 01/12/17 1220  . alum & mag hydroxide-simeth (MAALOX/MYLANTA) 200-200-20 MG/5ML suspension 30 mL  30 mL Oral Q4H PRN [name], MD   30 mL at 01/13/17 0251  . aspirin EC tablet 81 mg  81 mg Oral Daily Jolanta B Pucilowska, MD   81 mg at 01/13/17 0815  . atorvastatin (LIPITOR) tablet 40 mg  40 mg Oral q1800 Jolanta B Pucilowska, MD   40 mg at 01/12/17 1736  . buPROPion (WELLBUTRIN XL) 24 hr tablet 150 mg  150 mg Oral Daily Jolanta B Pucilowska, MD   150 mg at 01/13/17 0815  . cloNIDine (CATAPRES) tablet 0.1 mg  0.1 mg Oral TID PRN Clovis Fredrickson, MD   0.1 mg at 01/12/17 1940  . lisinopril (PRINIVIL,ZESTRIL) tablet 10 mg  10 mg Oral Daily Clovis Fredrickson, MD   10 mg at 01/13/17 0815  . magnesium hydroxide (MILK OF MAGNESIA) suspension 30 mL  30 mL Oral Daily PRN [name], MD      . Melatonin TABS 10 mg  10 mg Oral QHS Clovis Fredrickson, MD   10 mg at 01/12/17 2143  . nicotine (NICODERM CQ - dosed in mg/24 hours) patch 21 mg  21 mg Transdermal Daily Jolanta  B Pucilowska, MD   21 mg at 01/13/17 0815  . QUEtiapine (SEROQUEL XR) 24 hr tablet 300 mg  300 mg Oral QHS Hildred Priest, MD   300 mg at 01/12/17 2143  . QUEtiapine (SEROQUEL XR) 24 hr tablet 50 mg  50 mg Oral BID Hildred Priest, MD   50 mg at 01/13/17 [id number]   Current Outpatient Prescriptions  Medication Sig Dispense Refill  . aspirin 81 MG EC tablet Take 1 tablet (81 mg total) by mouth daily. 30 tablet 0  . atorvastatin (LIPITOR) 40 MG tablet Take 1 tablet (40 mg total) by mouth daily at 6 PM. 30 tablet 0  . buPROPion (WELLBUTRIN XL) 150 MG 24 hr tablet Take 1 tablet (150 mg total) by mouth daily. 30 tablet 1  . calcium carbonate (TUMS) 500 MG chewable tablet Chew 2 tablets (400 mg of elemental calcium total) by mouth 3 (three) times daily as needed for indigestion or heartburn. 90 tablet 0  . lisinopril (PRINIVIL,ZESTRIL) 10 MG tablet Take 1 tablet (10 mg total) by mouth daily. 30 tablet 0  . Melatonin 5 MG TABS Take 2 tablets (10 mg total) by mouth at bedtime. 30 tablet 0  . QUEtiapine (SEROQUEL XR) 300 MG 24 hr tablet Take 1 tablet (300 mg total) by mouth at bedtime. 30 tablet 1  . QUEtiapine (SEROQUEL)  50 MG tablet Take 1 tablet (50 mg total) by mouth 2 (two) times daily. 60 tablet 1   PTA Medications: No prescriptions prior to admission.    Patient Stressors: Financial difficulties Health problems Medication change or noncompliance Substance abuse  Patient Strengths: Ability for insight Capable of independent living Motivation for treatment/growth Supportive family/friends  Treatment Modalities: Medication Management, Group therapy, Case management,  1 to 1 session with clinician, Psychoeducation, Recreational therapy.   Physician Treatment Plan for Primary Diagnosis: Bipolar I disorder, most recent episode depressed with anxious distress ([place]) Long Term Goal(s): Improvement in symptoms so as ready for discharge Improvement in symptoms so as ready for  discharge   Short Term Goals: Ability to identify changes in lifestyle to reduce recurrence of condition will improve Ability to verbalize feelings will improve Ability to disclose and discuss suicidal ideas Ability to demonstrate self-control will improve Ability to identify and develop effective coping behaviors will improve Ability to maintain clinical measurements within normal limits will improve Compliance with prescribed medications will improve Ability to identify triggers associated with substance abuse/mental health issues will improve Ability to identify changes in lifestyle to reduce recurrence of condition will improve Ability to demonstrate self-control will improve Ability to identify triggers associated with substance abuse/mental health issues will improve  Medication Management: Evaluate patient's response, side effects, and tolerance of medication regimen.  Therapeutic Interventions: 1 to 1 sessions, Unit Group sessions and Medication administration.  Evaluation of Outcomes: Adequate for Discharge  Physician Treatment Plan for Secondary Diagnosis: Principal Problem:   Bipolar I disorder, most recent episode depressed with anxious distress ([place]) Active Problems:   HTN (hypertension)   Alcohol use disorder, moderate, dependence ([place])   Cannabis use disorder, moderate, dependence ([place])   Tobacco use disorder   Dyslipidemia   Barrett's esophagus  Long Term Goal(s): Improvement in symptoms so as ready for discharge Improvement in symptoms so as ready for discharge   Short Term Goals: Ability to identify changes in lifestyle to reduce recurrence of condition will improve Ability to verbalize feelings will improve Ability to disclose and discuss suicidal ideas Ability to demonstrate self-control will improve Ability to identify and develop effective coping behaviors will improve Ability to maintain clinical measurements within normal limits will improve Compliance  with prescribed medications will improve Ability to identify triggers associated with substance abuse/mental health issues will improve Ability to identify changes in lifestyle to reduce recurrence of condition will improve Ability to demonstrate self-control will improve Ability to identify triggers associated with substance abuse/mental health issues will improve     Medication Management: Evaluate patient's response, side effects, and tolerance of medication regimen.  Therapeutic Interventions: 1 to 1 sessions, Unit Group sessions and Medication administration.  Evaluation of Outcomes: Adequate for Discharge   RN Treatment Plan for Primary Diagnosis: Bipolar I disorder, most recent episode depressed with anxious distress ([place]) Long Term Goal(s): Knowledge of disease and therapeutic regimen to maintain health will improve  Short Term Goals: Ability to demonstrate self-control, Ability to participate in decision making will improve, Ability to verbalize feelings will improve and Ability to identify and develop effective coping behaviors will improve  Medication Management: RN will administer medications as ordered by provider, will assess and evaluate patient's response and provide education to patient for prescribed medication. RN will report any adverse and/or side effects to prescribing provider.  Therapeutic Interventions: 1 on 1 counseling sessions, Psychoeducation, Medication administration, Evaluate responses to treatment, Monitor vital signs and CBGs as ordered, Perform/monitor CIWA, COWS,  AIMS and Fall Risk screenings as ordered, Perform wound care treatments as ordered.  Evaluation of Outcomes: Adequate for Discharge   LCSW Treatment Plan for Primary Diagnosis: Bipolar I disorder, most recent episode depressed with anxious distress ([place]) Long Term Goal(s): Safe transition to appropriate next level of care at discharge, Engage patient in therapeutic group addressing interpersonal  concerns.  Short Term Goals: Engage patient in aftercare planning with referrals and resources, Increase social support, Increase ability to appropriately verbalize feelings, Increase emotional regulation, Facilitate acceptance of mental health diagnosis and concerns, Facilitate patient progression through stages of change regarding substance use diagnoses and concerns, Identify triggers associated with mental health/substance abuse issues and Increase skills for wellness and recovery  Therapeutic Interventions: Assess for all discharge needs, 1 to 1 time with Social worker, Explore available resources and support systems, Assess for adequacy in community support network, Educate family and significant other(s) on suicide prevention, Complete Psychosocial Assessment, Interpersonal group therapy.  Evaluation of Outcomes: Adequate for Discharge   Progress in Treatment: Attending groups: Yes. Participating in groups: Yes. Taking medication as prescribed: Yes. Toleration medication: Yes. Family/Significant other contact made: Yes, individual(s) contacted:  sister Patient understands diagnosis: Yes. Discussing patient identified problems/goals with staff: Yes. Medical problems stabilized or resolved: Yes. Denies suicidal/homicidal ideation: Yes. Issues/concerns per patient self-inventory: No. Other: n/a  New problem(s) identified: None identified at this time.   New Short Term/Long Term Goal(s): None identified at this time.   Discharge Plan or Barriers: Patient will discharge home and follow-up with [facility] for outpatient services.   Reason for Continuation of Hospitalization: none.  Discharge today.  Estimated Length of Stay: discharge today.   Attendees: Patient: Ian [name] 01/13/2017   Physician: Dr. Orson Slick, MD 01/13/2017   Nursing: Jerelene Redden, RN 01/13/2017   RN Care Manager: 01/13/2017   Social Worker: Jen Mow. Satira Sark 01/13/2017   Recreational  Therapist: Leonette Monarch, LRT/CTRS 01/13/2017   Other:  01/13/2017   Other:  01/13/2017   Other: 01/13/2017     Scribe for Treatment Team: Joanne Chars, [place] 01/13/2017 2:34 PM

## 2017-01-13 NOTE — Discharge Summary (Deleted)
Physician Discharge Summary Note  Patient:  Ian [name] is an 56 y.o., male MRN:  [id number] DOB:  1961/12/13 Patient phone:  [phone] (home)  Patient address:   [address] [place] [address],  Total Time spent with patient: 30 minutes  Date of Admission:  01/06/2017 Date of Discharge: 01/13/2017  Reason for Admission:  Suicidal ideation.  Identifying data. Ian [name] is a 56 year old male with a history of severe depression, mood instability, anxiety, and alcohol abuse.  Chief complaint. "I am freaking out."  History of present illness. Information was obtained from the patient and the chart. The patient has a long history of mental illness and is disabled from. Ian [name] has been maintained successfully for the past 5 years on a combination of Lexapro, Seroquel, and clonazepam. Ian [name] follows up with Ian [name] and has been compliant with medications but they recently started tapering off his clonazepam. The patient has been also drinking some but not heavily with his last drink on Friday over the holidays the patient became increasingly depressed for sleep, decreased appetite, anhedonia, being of guilt and hopelessness worthlessness, poor energy and concentration, social isolation, and crying spells. Ian [name] started thinking of suicide. His anxiety went through the roof. His mind is racing and Ian [name] feels shaky and sweaty all the time and wants to die. Ian [name] also started worrying about Barrett esophagus and convince himself that Ian [name] is gravely ill. Ian [name] went to the emergency room few days ago. Ian [name] denies psychotic symptoms or symptoms suggestive of bipolar mania. Ian [name] reports severe social anxiety and frequent panic attacks Ian [name] denies PTSD symptoms. Ian [name] is a Ian [name]. Ian [name] has been drinking alcohol and smoking marijuana but decided to stop.  Past psychiatric history. As there were 3 or 4 prior psychiatric [name] patient's [facility] for bipolar and substance abuse. 5 years ago Ian [name] was  referred to Ian [name] where Ian [name] was hospitalized for a month, put on the right medications, and treated for alcoholism. His drinking has never been as bad as before since. Ian [name] admits to suicide attempts in the past.  Family psychiatric history. None reported.  Social history. Ian [name] is disabled from mental illness Ian [name] lives in law alone in her dilapidated house. Ian [name] is a Ian [name]. Ian [name] has never been married and has no children. Ian [name] is estranged from his family.  Principal Problem: Bipolar I disorder, most recent episode depressed with anxious distress Ian [name]) Discharge Diagnoses: Patient Active Problem List   Diagnosis Date Noted  . Dyslipidemia [E78.5] 01/07/2017  . Barrett's esophagus [K22.70] 01/07/2017  . Bipolar I disorder, most recent episode depressed with anxious distress (Ian [name]) [F31.30] 01/06/2017  . Cannabis use disorder, moderate, dependence (Ian [name]) [F12.20] 01/06/2017  . Tobacco use disorder [F17.200] 01/06/2017  . Alcohol use disorder, moderate, dependence (Ian [name]) [F10.20] 10/06/2016  . Substance induced mood disorder (Ian [name]) [F19.94] 10/06/2016  . Cocaine use disorder, moderate, dependence (Ian [name]) [F14.20] 10/06/2016  . Depression [F32.9] 10/06/2016  . Acute left-sided weakness [M62.89] 05/03/2015  . CVA (cerebral infarction) [I63.9] 05/03/2015  . Weakness [R53.1] 11/16/2013  . Chest pain [R07.9] 11/16/2013  . HTN (hypertension) [I10] 11/16/2013  . Left-sided weakness [R53.1] 11/16/2013  . Torsades de pointes (Ian [name]) [I47.2] 11/16/2013  . Hemiplegia, unspecified, affecting nondominant side [G81.90] 11/15/2013  . Speech and language deficits [F80.9, R47.9] 11/15/2013    Past Medical History:  Past Medical History:  Diagnosis Date  . Anxiety   . Barrett esophagus   . Cancer (Ian [name])   . Coronary artery disease   .  Depression   . GERD (gastroesophageal reflux disease)   . Hypertension   . Stroke Ian [name]) 2014   "mini-stroke" per patient    Past Surgical History:  Procedure Laterality Date  .  ANGIOPLASTY    . APPENDECTOMY    . CARDIAC CATHETERIZATION    . CHOLECYSTECTOMY    . WRIST SURGERY Right    age 35   Family History:  Family History  Problem Relation Age of Onset  . Stroke Father   . Leukemia Father   . Breast cancer Sister    Social History:  History  Alcohol Use  . Yes    Comment: denies being a daily drinker but did have alcohol today     History  Drug Use  . Types: Cocaine, Marijuana    Comment: used cocaine today; denies smoking marijuana for years    Social History   Social History  . Marital status: Single    Spouse name: N/A  . Number of children: N/A  . Years of education: N/A   Social History Main Topics  . Smoking status: Current Every Day Smoker    Packs/day: 1.00    Types: Cigarettes  . Smokeless tobacco: Never Used  . Alcohol use Yes     Comment: denies being a daily drinker but did have alcohol today  . Drug use: Yes    Types: Cocaine, Marijuana     Comment: used cocaine today; denies smoking marijuana for years  . Sexual activity: Not Asked   Other Topics Concern  . None   Social History Narrative  . None    [name] Course:    Ian [name] is a 56 year old male with history of depression, anxiety, mood instability and alcoholism admitted for suicidal ideation and heightened anxiety in the context of medication changes and discontinuation of alcohol.  1. Suicidal ideation. Resolved. The patient is able to contract for safety. Ian [name] is forward thinking and more optimistic about the future.  2. Mood. For the past 5 years the patient has been maintained on a combination of Seroquel, Celexa, and clonazepam. Clonazepam has been gradually discontinued by his primary psychiatrist. The patient has a history of torsades in 2014. The patient is demanding to put him back on Seroquel. Ian [name] understands the risks. I gave Seroquel 50 mg at breakfast and lunch and Seroquel XR 300 mg at bedtime. Daily EKG did not reveal QTC prolongation. I  discontinued Celexa and started Wellbutrin as it does not affect QTC.  3. Alcoholism. Ian [name] stopped drinking 2 days ago. Ian [name] completed Librium taper.  4. Substance abuse treatment. The patient refuses long-term treatment. Ian [name] will follow up with [facility].  5. Hypertension. Ian [name] is on lisinopril.  6. Dyslipidemia. Ian [name] is on Crestor.  7. Barrett's esophagitis. Ian [name] is on Protonix.   8. Metabolic syndrome monitoring. Lipid profile, TSH and HgbA1C are normal.    9. COPD. Albuterol is available.  10. Insomnia. Melatonin was offered.   11. Anxiety. Ian [name] reports "anxiety attacks" that are helped with Seroquel.   12. Smoking. Nicotine patch was available.   13. Disposition. Ian [name] was discharged to home. Ian [name] will follow-up with [facility].   Physical Findings: AIMS:  , ,  ,  ,    CIWA:    COWS:     Musculoskeletal: Strength & Muscle Tone: within normal limits Gait & Station: normal Patient leans: N/A  Psychiatric Specialty Exam: Physical Exam  Nursing note and vitals reviewed. Psychiatric: His speech is normal and behavior is normal. Thought content  normal. His mood appears anxious. Cognition and memory are normal. Ian [name] expresses impulsivity.    Review of Systems  Psychiatric/Behavioral: Positive for substance abuse. The patient is nervous/anxious.   All other systems reviewed and are negative.   Blood pressure 109/76, pulse 79, temperature 97.8 F (36.6 C), temperature source Oral, resp. rate 18, height 6' (1.829 m), weight 109.8 kg (242 lb), SpO2 98 %.Body mass index is 32.82 kg/m.  General Appearance: Casual  Eye Contact:  Good  Speech:  Clear and Coherent  Volume:  Normal  Mood:  Anxious  Affect:  Appropriate  Thought Process:  Goal Directed and Descriptions of Associations: Intact  Orientation:  Full (Time, Place, and Person)  Thought Content:  WDL  Suicidal Thoughts:  No  Homicidal Thoughts:  No  Memory:  Immediate;   Fair Recent;   Fair Remote;   Fair  Judgement:   Impaired  Insight:  Shallow  Psychomotor Activity:  Normal  Concentration:  Concentration: Fair and Attention Span: Fair  Recall:  [employer] of Knowledge:  Fair  Language:  Fair  Akathisia:  No  Handed:  Right  AIMS (if indicated):     Assets:  Communication Skills Desire for Improvement Financial Resources/Insurance Housing Physical Health Resilience Social Support  ADL's:  Intact  Cognition:  WNL  Sleep:  Number of Hours: 6.15     Have you used any form of tobacco in the last 30 days? (Cigarettes, Smokeless Tobacco, Cigars, and/or Pipes): Yes  Has this patient used any form of tobacco in the last 30 days? (Cigarettes, Smokeless Tobacco, Cigars, and/or Pipes) Yes, Yes, A prescription for an FDA-approved tobacco cessation medication was offered at discharge and the patient refused  Blood Alcohol level:  Lab Results  Component Value Date   ETH <5 01/06/2017   ETH 111 (H) 123456    Metabolic Disorder Labs:  Lab Results  Component Value Date   HGBA1C 5.2 01/07/2017   MPG 103 01/07/2017   MPG 103 11/16/2013   No results found for: PROLACTIN Lab Results  Component Value Date   CHOL 153 01/07/2017   TRIG 155 (H) 01/07/2017   HDL 36 (L) 01/07/2017   CHOLHDL 4.3 01/07/2017   VLDL 31 01/07/2017   LDLCALC 86 01/07/2017   [place] 75 09/02/2016    See Psychiatric Specialty Exam and Suicide Risk Assessment completed by Attending Physician prior to discharge.  Discharge destination:  Home  Is patient on multiple antipsychotic therapies at discharge:  No   Has Patient had three or more failed trials of antipsychotic monotherapy by history:  No  Recommended Plan for Multiple Antipsychotic Therapies: NA  Discharge Instructions    Diet - low sodium heart healthy    Complete by:  As directed    Increase activity slowly    Complete by:  As directed      Allergies as of 01/13/2017      Reactions   Fluoxetine Swelling   Anxiety, itching    Hydrocodone-acetaminophen Itching      Medication List    STOP taking these medications   acetaminophen-codeine 300-30 MG tablet Commonly known as:  TYLENOL #3   citalopram 20 MG tablet Commonly known as:  CELEXA   clonazePAM 0.5 MG tablet Commonly known as:  KLONOPIN   famotidine 20 MG tablet Commonly known as:  PEPCID   gabapentin 100 MG capsule Commonly known as:  NEURONTIN   hydrOXYzine 50 MG tablet Commonly known as:  ATARAX/VISTARIL   ondansetron 4 MG  tablet Commonly known as:  ZOFRAN   oxyCODONE-acetaminophen 5-325 MG tablet Commonly known as:  ROXICET   pantoprazole 40 MG tablet Commonly known as:  PROTONIX   PROAIR HFA 108 (90 Base) MCG/ACT inhaler Generic drug:  albuterol   QUEtiapine 300 MG tablet Commonly known as:  SEROQUEL Replaced by:  QUEtiapine 50 MG Tb24 24 hr tablet   QUEtiapine 50 MG tablet Commonly known as:  SEROQUEL Replaced by:  QUEtiapine 300 MG 24 hr tablet   thiamine 50 MG tablet     TAKE these medications     Indication  aspirin 81 MG EC tablet Take 1 tablet (81 mg total) by mouth daily.  Indication:  Inflammation   atorvastatin 40 MG tablet Commonly known as:  LIPITOR Take 1 tablet (40 mg total) by mouth daily at 6 PM.  Indication:  High Amount of Fats in the Blood   buPROPion 150 MG 24 hr tablet Commonly known as:  WELLBUTRIN XL Take 1 tablet (150 mg total) by mouth daily.  Indication:  Major Depressive Disorder   calcium carbonate 500 MG chewable tablet Commonly known as:  TUMS Chew 2 tablets (400 mg of elemental calcium total) by mouth 3 (three) times daily as needed for indigestion or heartburn.  Indication:  Acid Indigestion   lisinopril 10 MG tablet Commonly known as:  PRINIVIL,ZESTRIL Take 1 tablet (10 mg total) by mouth daily.  Indication:  High Blood Pressure Disorder   Melatonin 5 MG Tabs Take 2 tablets (10 mg total) by mouth at bedtime.  Indication:  Trouble Sleeping   QUEtiapine 50 MG Tb24 24 hr  tablet Commonly known as:  SEROQUEL XR Take 1 tablet (50 mg total) by mouth 2 (two) times daily. Replaces:  QUEtiapine 300 MG tablet  Indication:  Manic-Depression   QUEtiapine 300 MG 24 hr tablet Commonly known as:  SEROQUEL XR Take 1 tablet (300 mg total) by mouth at bedtime. Replaces:  QUEtiapine 50 MG tablet  Indication:  Manic-Depression      Follow-up Information    [facility]. Go in 7 day(s).   Why:  Follow-up within 7 days of discharge for outpatient services. Walk-in hours are M-F 8a-4p. Bring insurance card. photo I.D., discharge summary, and current medications with you to first appointment. Arrive early to ensure prompt service.  Contact information: [address] [place], [place] [address] [phone] Fax:[phone]           Follow-up recommendations:  Activity:  as tolerated. Diet:  low sodium heart healthy. Other:  keep follow up appointments.   Comments:     Signed: Orson Slick, MD 01/13/2017, 8:43 AM

## 2017-01-13 NOTE — Progress Notes (Signed)
Pt awake, alert, oriented and up on unit until bedtime. Appropriately interacts with staff/peers. Observed in dayroom for group/snacks. Complained of anxiety at shift changed, PRN clonidine given as ordered for anxiety. Pt does report that clonidine helps with his anxiety. Appears less anxious, brighter and less irritable today. Smiles/brightens on interaction. Denies SI/HI/AVH. Medication compliant. Attends groups.   Support and encouragement provided with use of therapeutic communication. Medications administered as ordered with education. Safety maintained with every 15 minute checks. Will continue to monitor.

## 2017-01-14 NOTE — Progress Notes (Signed)
  [facility] Adult Case Management Discharge Plan :  Will you be returning to the same living situation after discharge:  Yes,  own residence At discharge, do you have transportation home?: Yes,  family Do you have the ability to pay for your medications: Yes,  medicaid  Release of information consent forms completed and in the chart;  Patient's signature needed at discharge.  Patient to Follow up at: Follow-up [facility]. Go in 7 day(s).   Why:  Follow-up within 7 days of discharge for outpatient services. Walk-in hours are M-F 8a-4p. Bring insurance card. photo I.D., discharge summary, and current medications with you to first appointment. Arrive early to ensure prompt service.  Contact information: [address] [place], [place] [address] [phone] Fax:[phone]           Next level of care provider has access to [place] and Suicide Prevention discussed: Yes,  sister  Have you used any form of tobacco in the last 30 days? (Cigarettes, Smokeless Tobacco, Cigars, and/or Pipes): Yes  Has patient been referred to the Quitline?: Yes, faxed on 01/12/17  Patient has been referred for addiction treatment: Yes  Joanne Chars, [place] 01/14/2017, 8:20 AM

## 2017-06-29 IMAGING — CR DG CHEST 2V
1 series · 2 of 2 positions shown · non-contrast
Comparison: 08/13/2015 chest radiograph.

CLINICAL DATA: CVA

EXAM:
CHEST  2 VIEW

[Series 1: dg chest 2 view · 0.14mm/px · 2 of 2 slices shown]
[im 1/2]
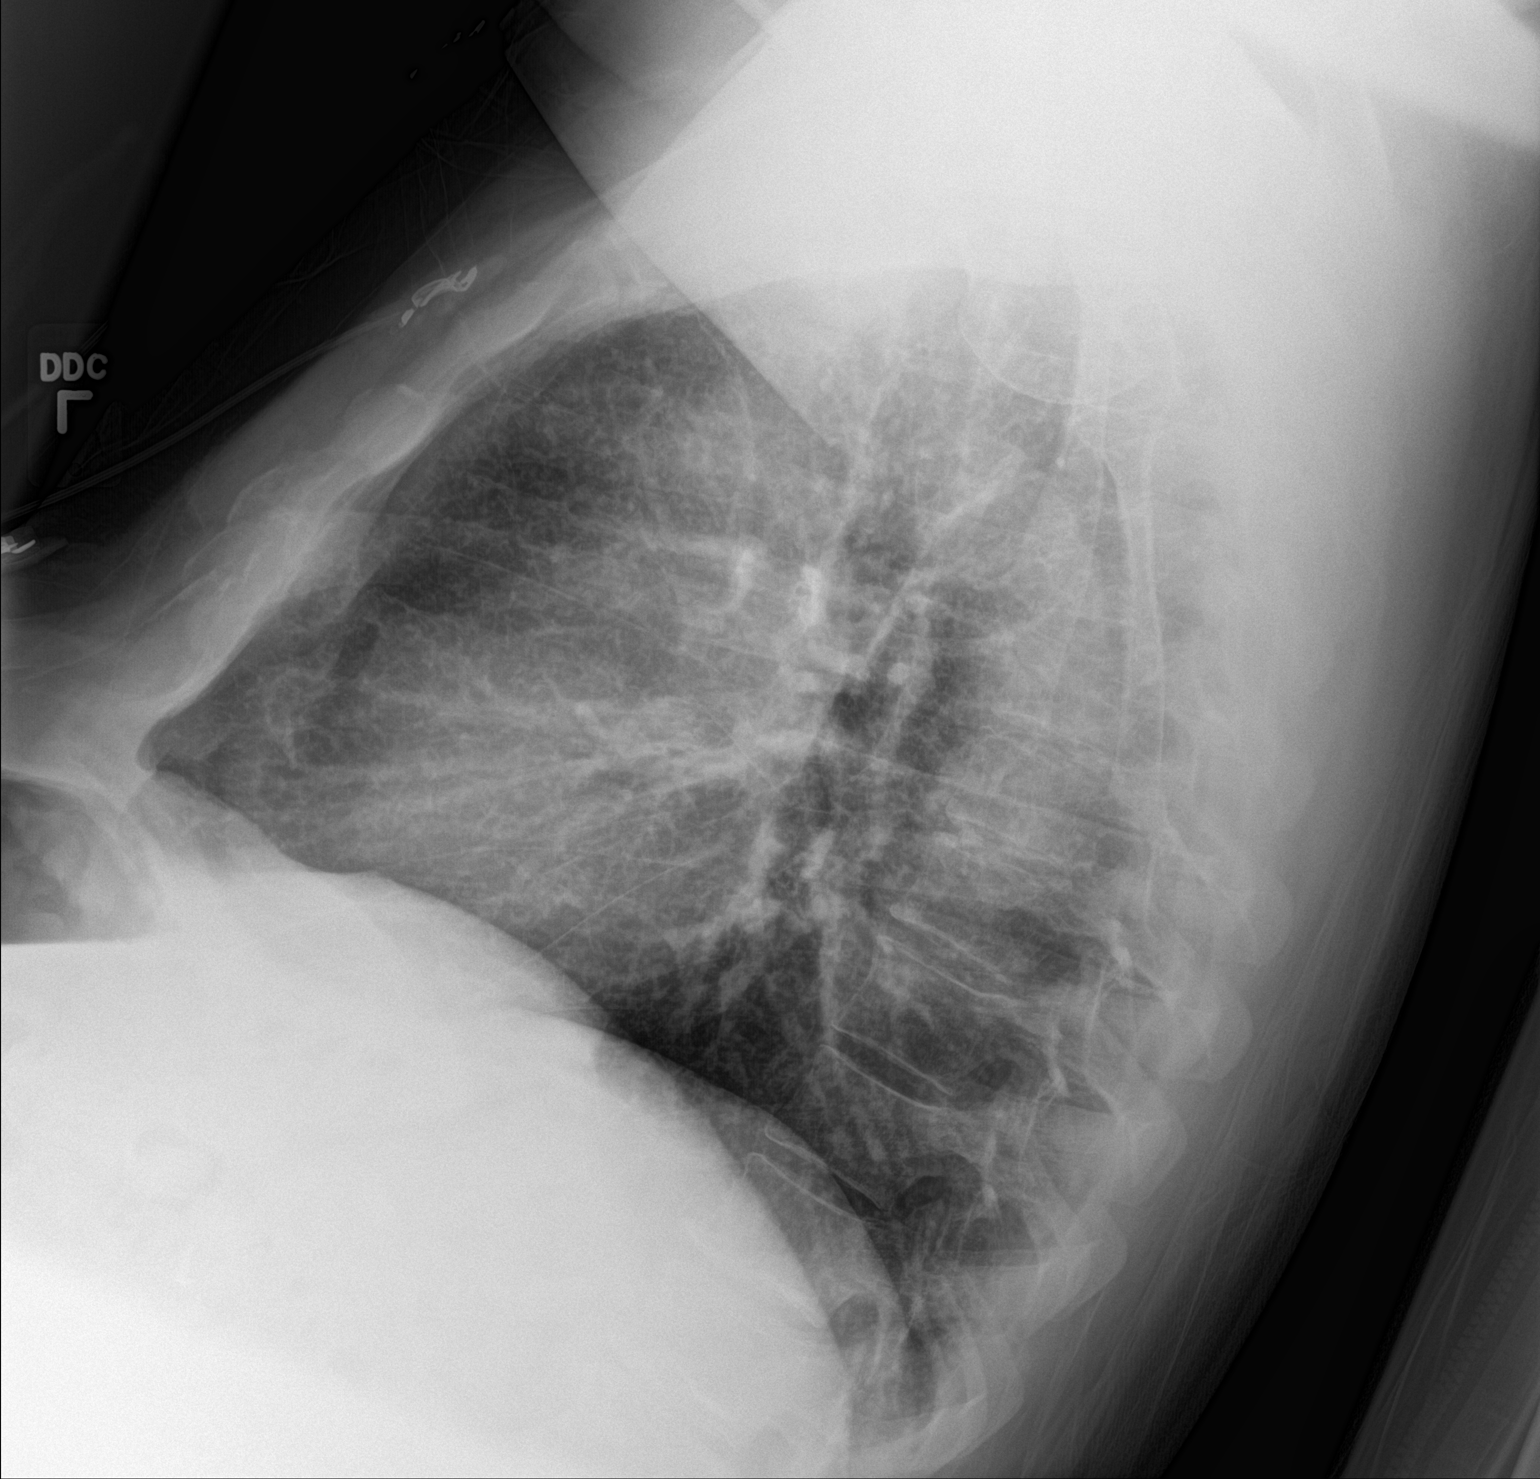
[im 2/2]
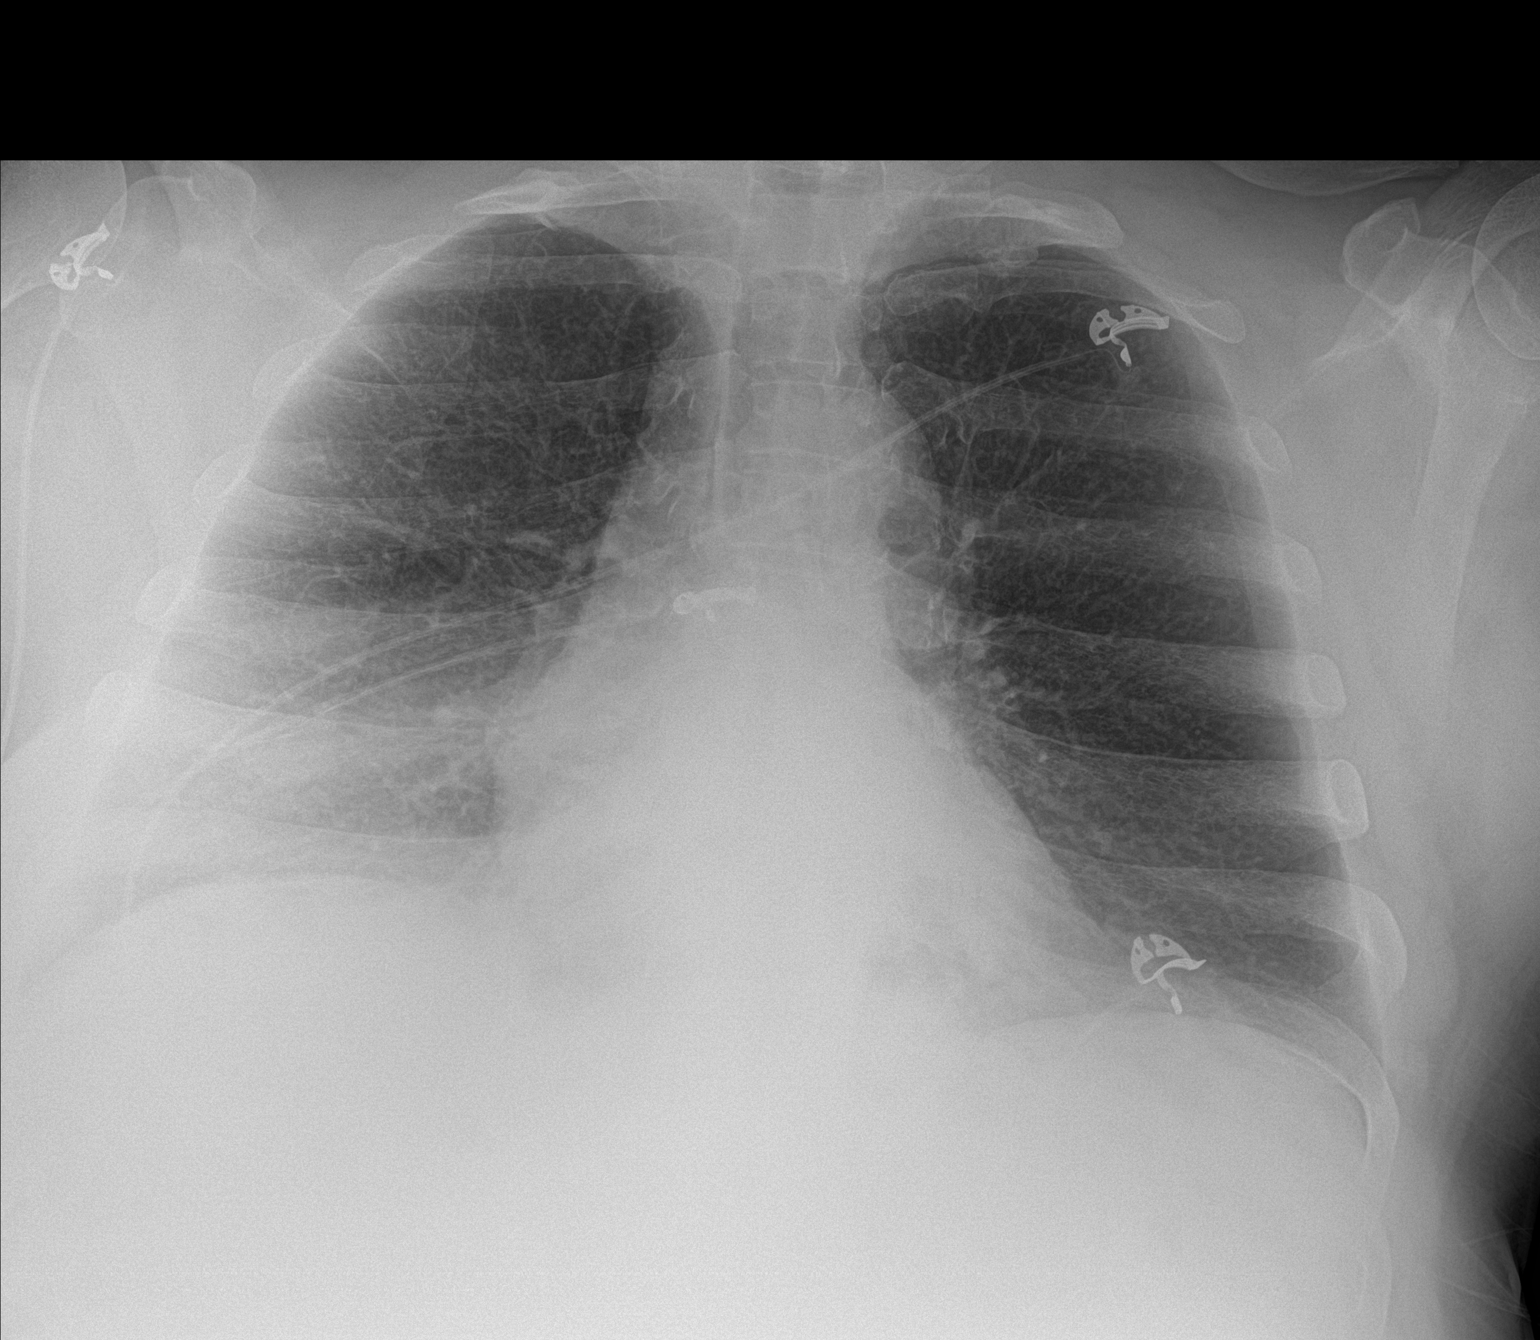

[2 of 2 positions shown; findings below may reference images not displayed]

FINDINGS: Stable cardiomediastinal silhouette with normal heart size. No
pneumothorax. No pleural effusion. No pulmonary edema. Mild
platelike atelectasis at the right lung base.
IMPRESSION: Mild platelike atelectasis at the right lung base. Otherwise no
active disease.

## 2017-06-29 IMAGING — MR MR MRA HEAD W/O CM
11 series · 44 of 48 positions shown · non-contrast
Comparison: Brain MRI 05/05/2015

CLINICAL DATA: CVA.  New onset numbness and weakness on the right.

EXAM:
MRI HEAD WITHOUT CONTRAST
MRA HEAD WITHOUT CONTRAST
TECHNIQUE: Multiplanar, multiecho pulse sequences of the brain and surrounding
structures were obtained without intravenous contrast. Angiographic
images of the head were obtained using MRA technique without
contrast.

[Series 2: T1 · sagittal · 5.0mm · 0.45mm/px · 3 of 25 slices shown (1 of 2)]
[im 1/25]
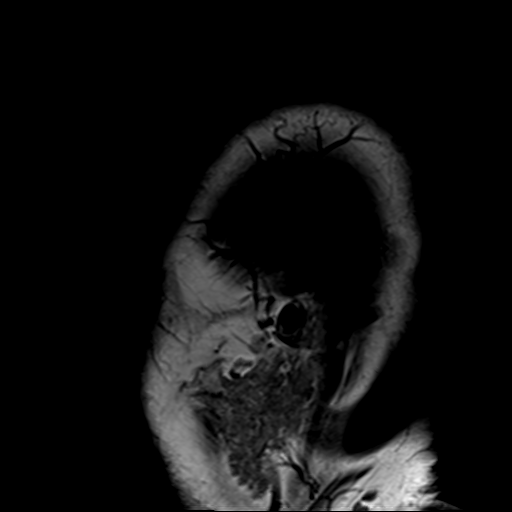
[im 13/25]
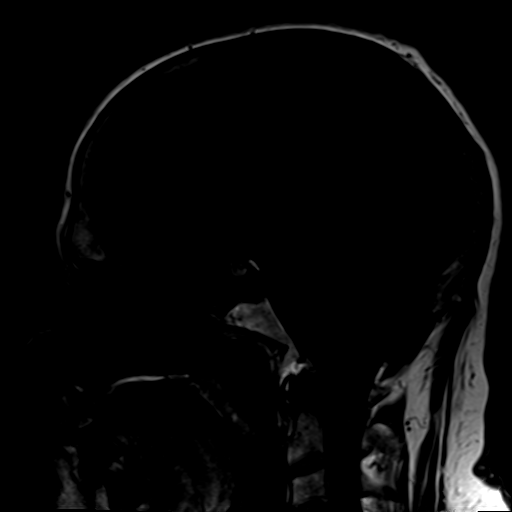
[im 25/25]
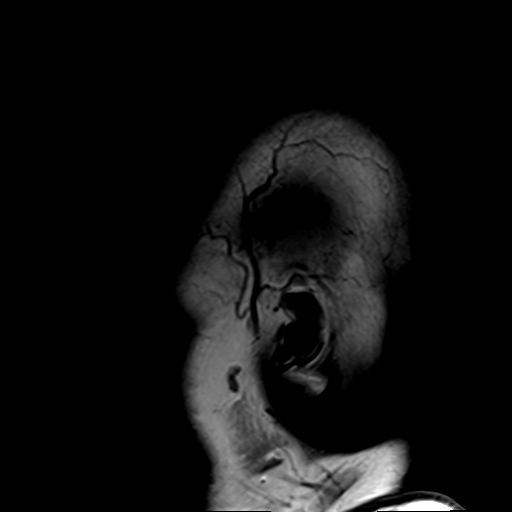

[Series 4: DWI · axial · 3.0mm · 1.80mm/px · z∈[-49,+113]mm · 6 of 53 slices shown (1 of 4)]
[im 1/53]
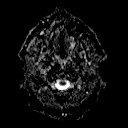
[im 11/53]
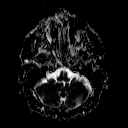
[im 21/53]
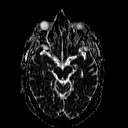
[im 32/53]
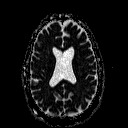
[im 42/53]
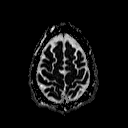
[im 53/53]
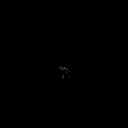

[Series 6: DWI · coronal · 3.0mm · 1.80mm/px · 4 of 47 slices shown (2 of 4)]
[im 1/47]
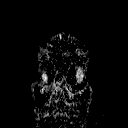
[im 16/47]
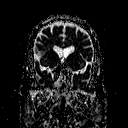
[im 31/47]
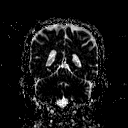
[im 47/47]
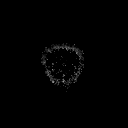

[Series 7: T2 · axial · 5.0mm · 0.60mm/px · z∈[-46,+110]mm · 2 of 25 slices shown (1 of 3)]
[im 1/25]
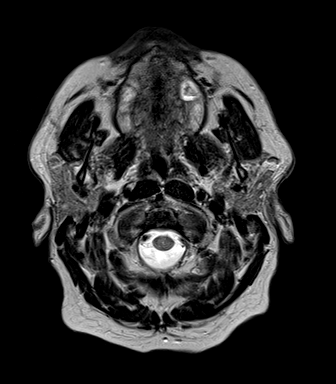
[im 25/25]
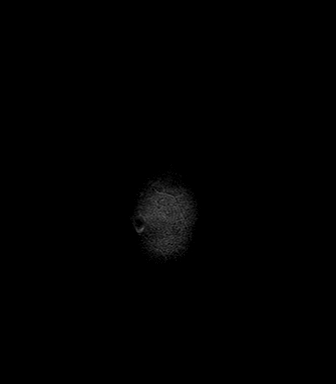

[Series 8: TOF · axial · non-contrast · 0.7mm · 0.37mm/px · z∈[-22,+44]mm · 7 of 120 slices shown]
[im 1/120]
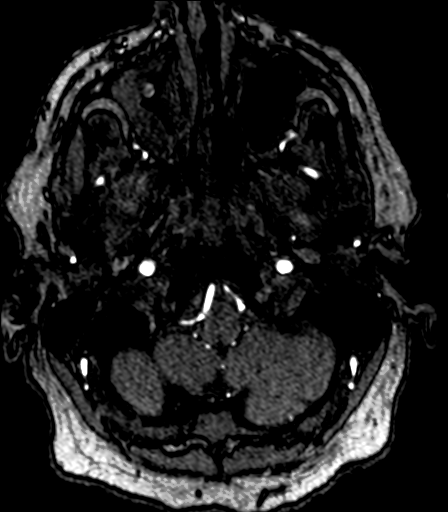
[im 24/120]
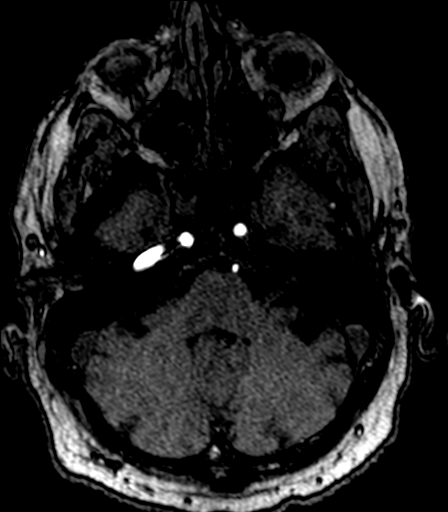
[im 36/120]
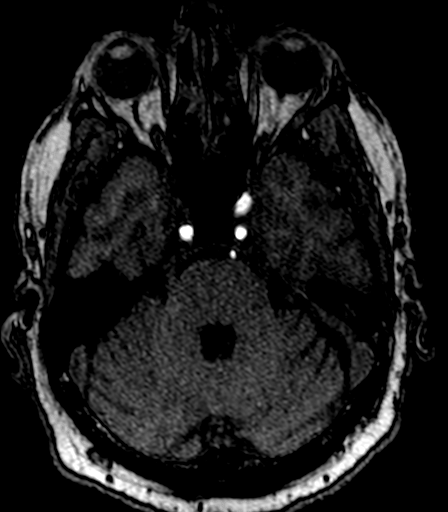
[im 48/120]
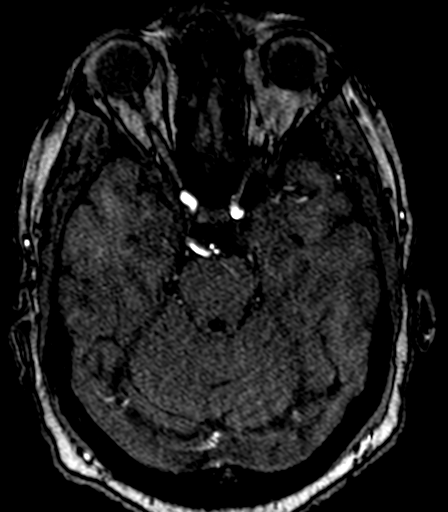
[im 72/120]
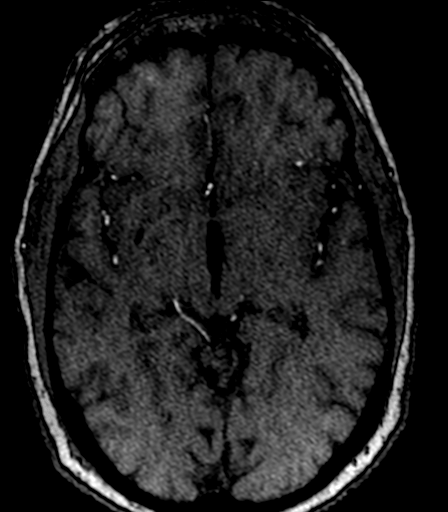
[im 84/120]
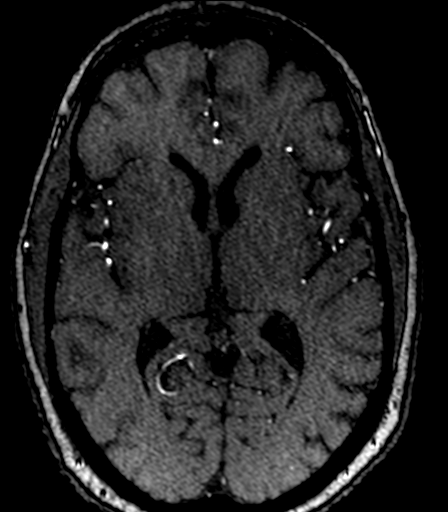
[im 96/120]
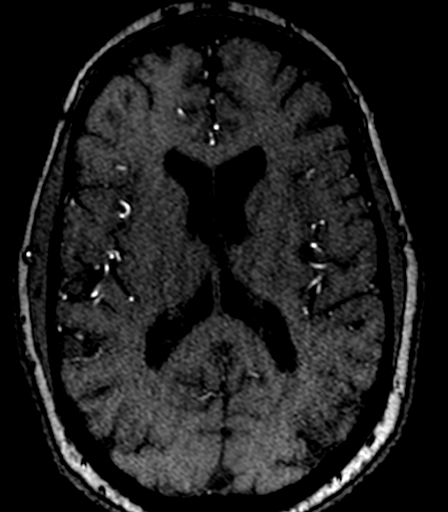

[Series 12: FLAIR · axial · 5.0mm · 0.45mm/px · z∈[-46,+110]mm · 2 of 25 slices shown]
[im 1/25]
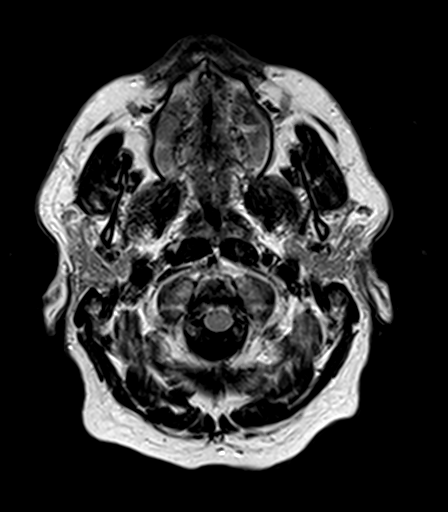
[im 25/25]
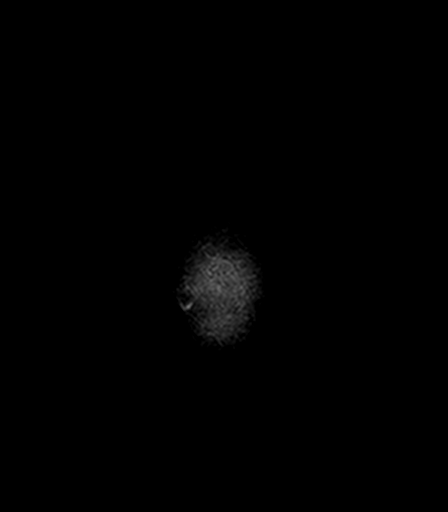

[Series 14: T2 · axial · 5.0mm · 0.45mm/px · z∈[-46,+110]mm · 2 of 25 slices shown (2 of 3)]
[im 1/25]
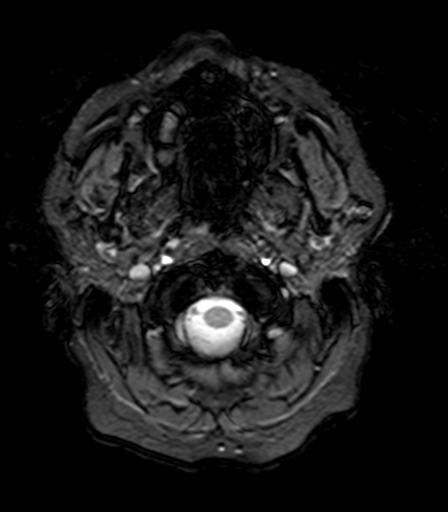
[im 25/25]
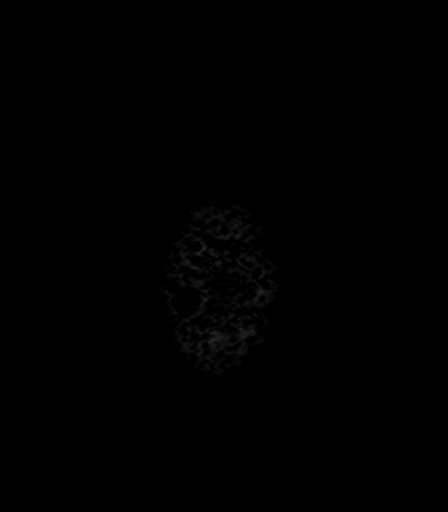

[Series 15: T1 · axial · 3.0mm · 1.00mm/px · z∈[-57,+119]mm · 6 of 60 slices shown (2 of 2)]
[im 1/60]
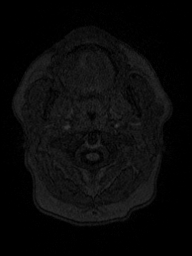
[im 12/60]
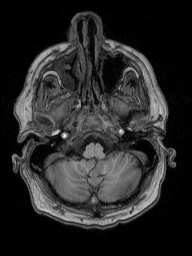
[im 24/60]
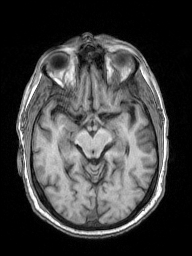
[im 36/60]
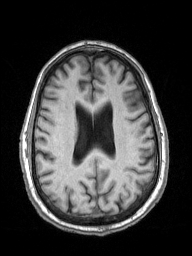
[im 48/60]
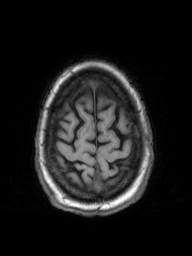
[im 60/60]
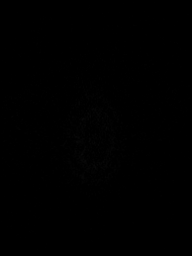

[Series 16: T2 · coronal · 5.0mm · 0.49mm/px · 3 of 29 slices shown (3 of 3)]
[im 1/29]
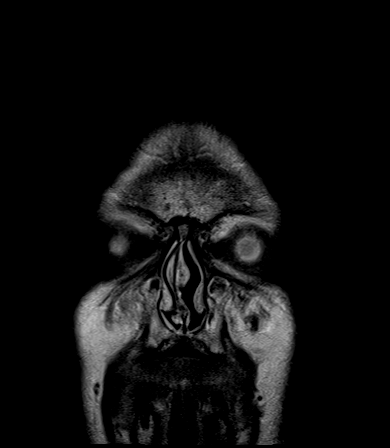
[im 15/29]
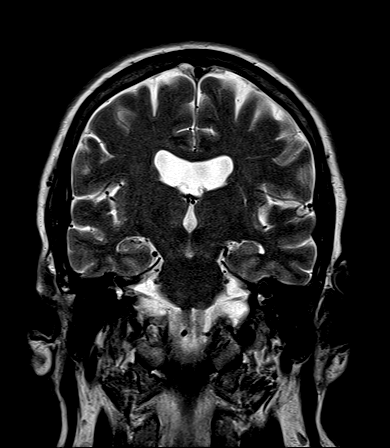
[im 29/29]
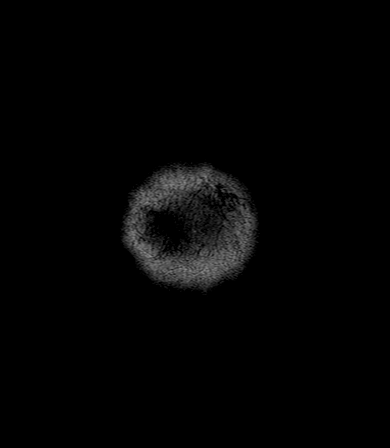

[Series 100: DWI · axial · 3.0mm · 1.80mm/px · z∈[-49,+113]mm · 5 of 55 slices shown (3 of 4)]
[im 1/55]
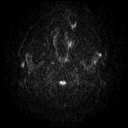
[im 14/55]
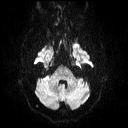
[im 28/55]
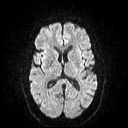
[im 41/55]
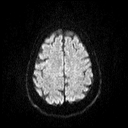
[im 55/55]
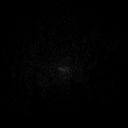

[Series 101: DWI · coronal · 3.0mm · 1.80mm/px · 4 of 47 slices shown (4 of 4)]
[im 1/47]
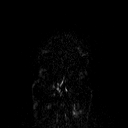
[im 16/47]
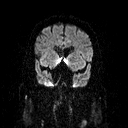
[im 31/47]
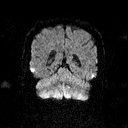
[im 47/47]
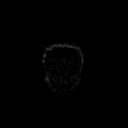

[44 of 48 positions shown; findings below may reference images not displayed]

FINDINGS: MRI HEAD FINDINGS

Calvarium and upper cervical spine: No focal marrow signal
abnormality.

Orbits: Negative.

Sinuses and Mastoids: Patchy mucosal thickening throughout the
paranasal sinuses, moderate in the right maxillary antrum.
Inflammation has progressed since 5391. No fluid levels.

Brain: No acute abnormality such as acute infarct, hemorrhage,
hydrocephalus, or mass lesion. Few patchy FLAIR hyperintensities in
the cerebral white matter, likely early chronic microvascular
disease in this patient with vascular risk factors.

MRA HEAD FINDINGS

Symmetric carotid arteries and branching. Large posterior
communicating arteries with fetal type PCA circulation. Intact
anterior communicating artery.

Right dominant vertebral artery with much of the left vertebral
artery flow into PICA. The proximal V4 segments and PICAs are
partially visualized. Small but smooth basilar in the setting of
fetal type PCA circulation.

No stenosis or major branch occlusion. Negative for aneurysm
(bulbous appearance of the right inferior frontal branch is non
aneurysmal based on multi projectional review in [HOSPITAL]) or
malformation.
IMPRESSION: 1. No acute finding including infarct.
2. Negative intracranial MRA.
3. Chronic sinusitis, progressed from 5391.

## 2017-06-29 IMAGING — DX DG FOOT COMPLETE 3+V*L*
3 series · 3 of 3 positions shown · non-contrast
Comparison: None.

CLINICAL DATA: Severe left heel pain, nontraumatic

EXAM:
LEFT FOOT - COMPLETE 3+ VIEW

[foot ap]
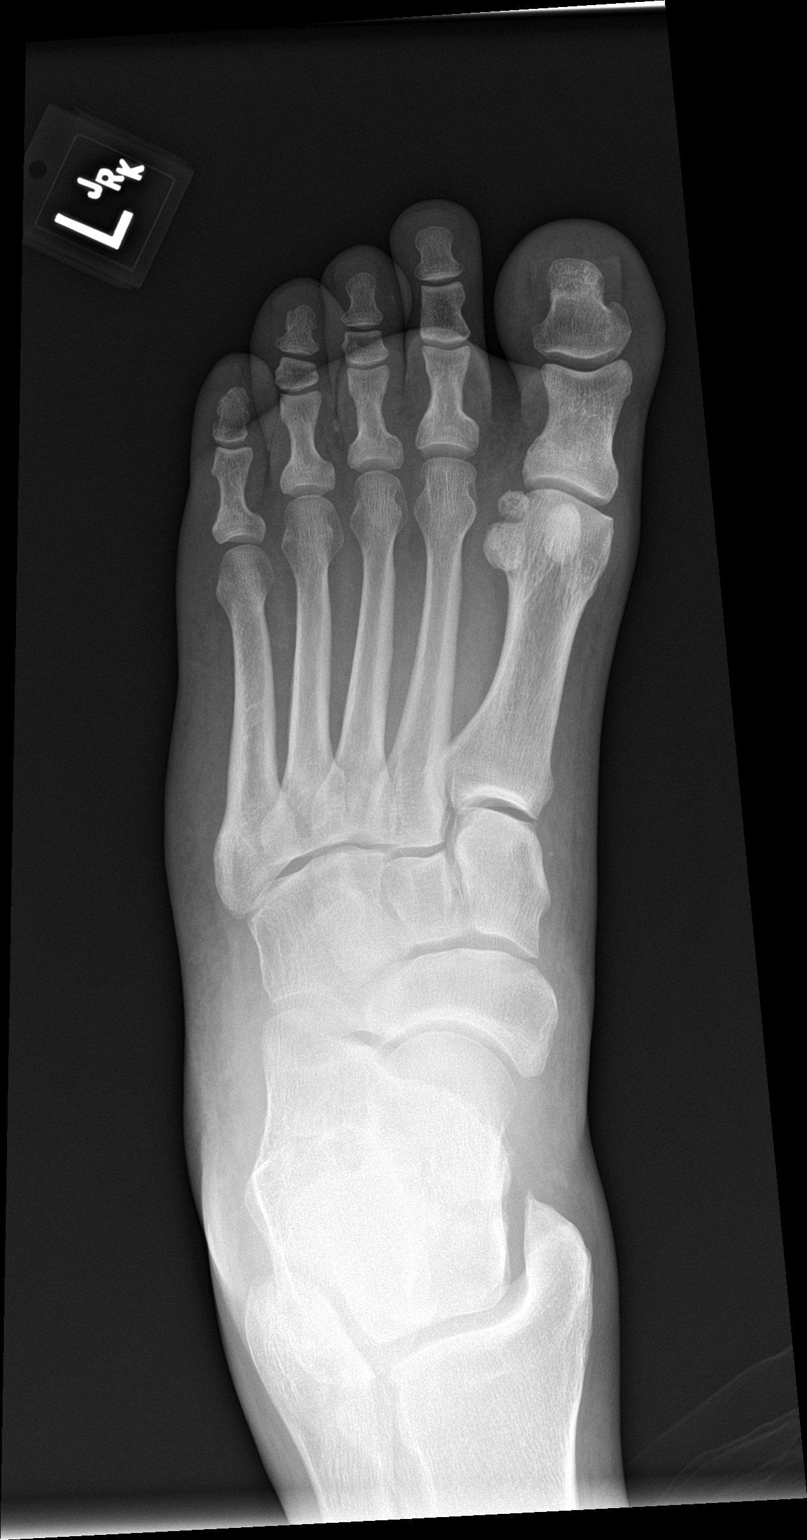

[foot obl]
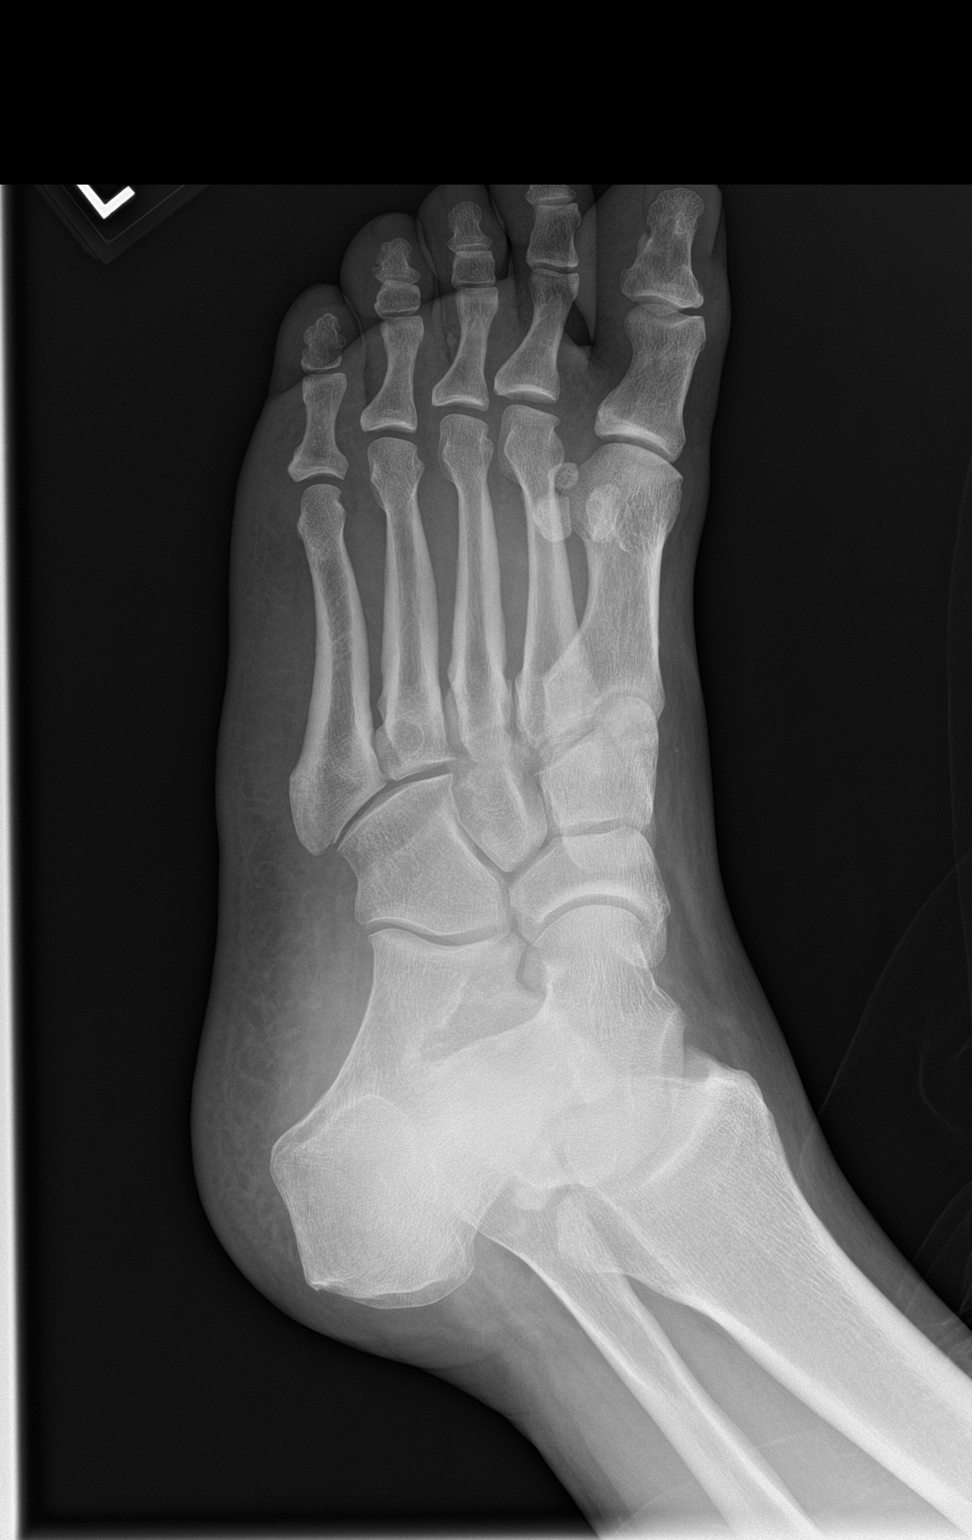

[foot lat]
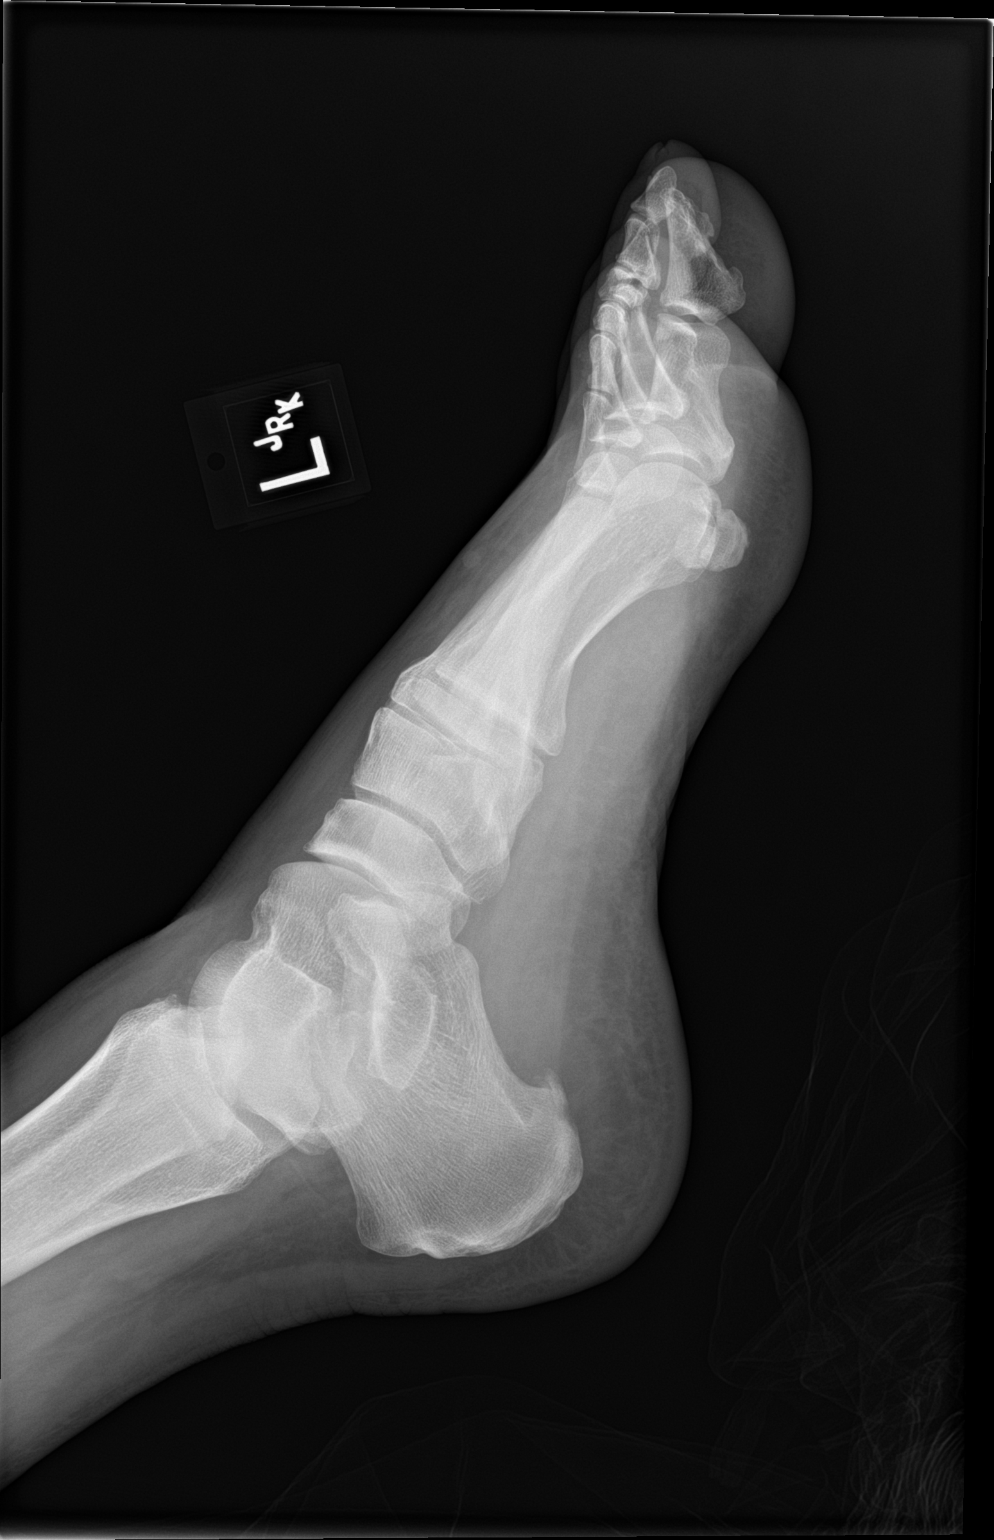

[3 of 3 positions shown; findings below may reference images not displayed]

FINDINGS: Negative for acute fracture or dislocation. There is a plantar
calcaneal spur. No significant arthritic changes are evident. There
is no bone lesion or bony destruction. No radiopaque foreign body.
IMPRESSION: Plantar calcaneal spur.

## 2017-06-29 IMAGING — CT CT HEAD W/O CM
3 series · 15 of 47 positions shown, 18 images · non-contrast
Comparison: Head CT 07/13/2015

CLINICAL DATA: Numbness and tingling for several hours, uncertain
of symptom onset time.

EXAM:
CT HEAD WITHOUT CONTRAST
TECHNIQUE: Contiguous axial images were obtained from the base of the skull
through the vertex without intravenous contrast.

[Series 2: head wo · axial · 0.50mm/px · z∈[-115,+10]mm · 9 of 30 slices shown, 12 images]
[im 3/30  brain]
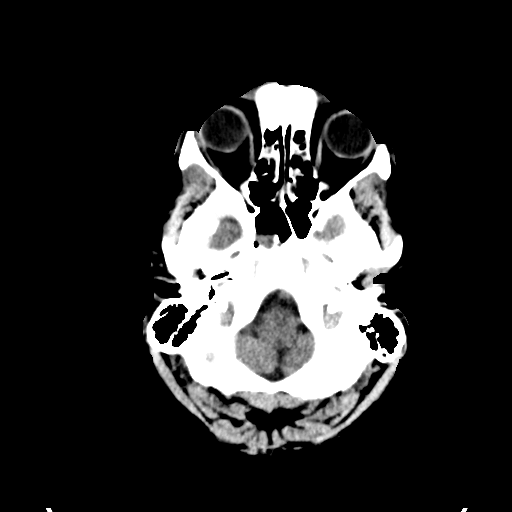
[im 3/30  bone]
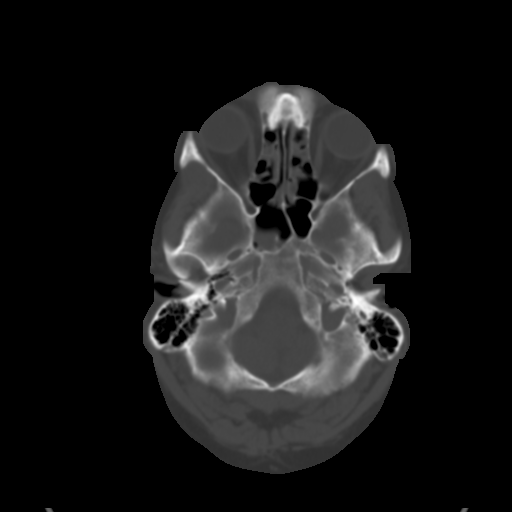
[im 6/30  brain]
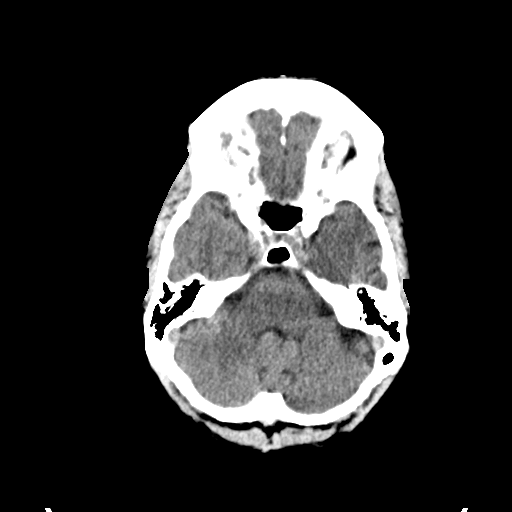
[im 9/30  brain]
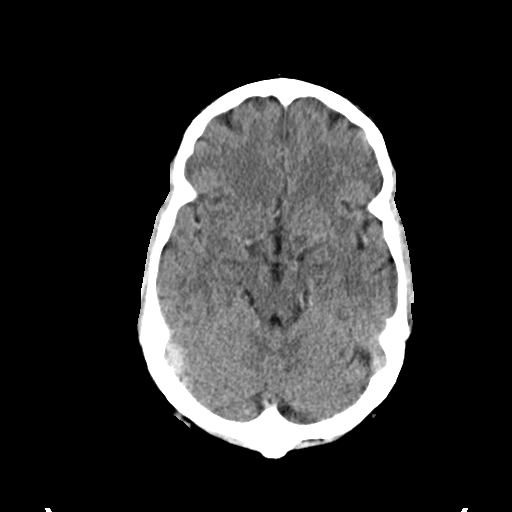
[im 12/30  brain]
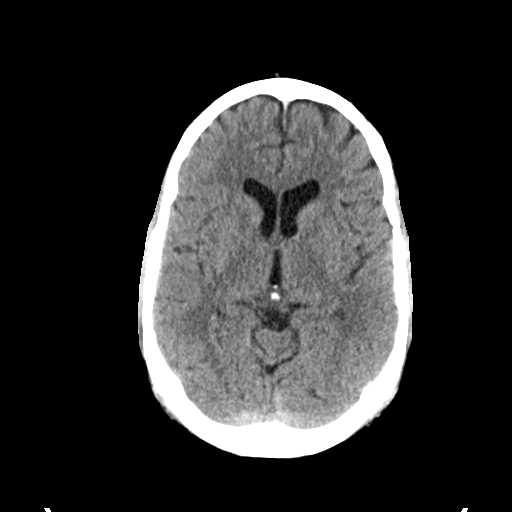
[im 16/30  brain]
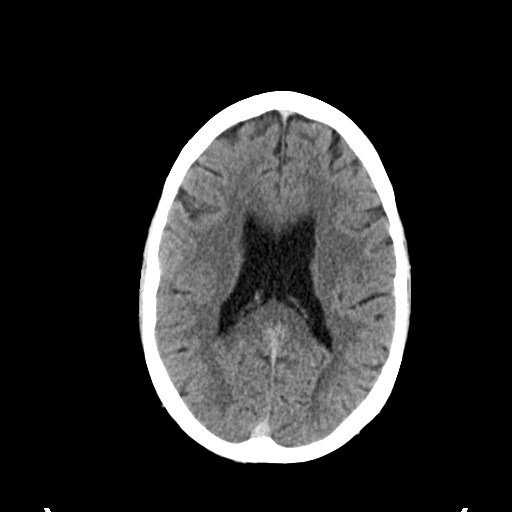
[im 16/30  bone]
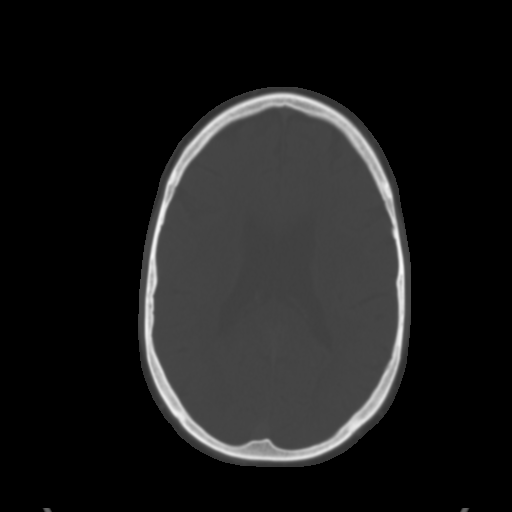
[im 19/30  brain]
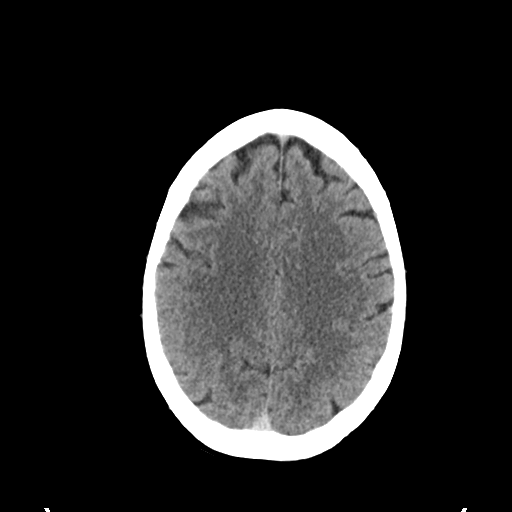
[im 22/30  brain]
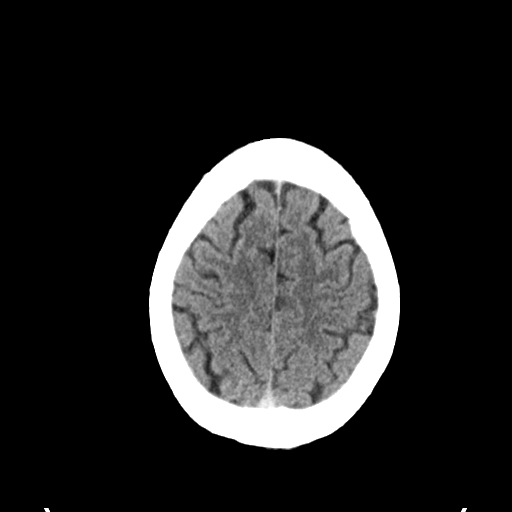
[im 25/30  brain]
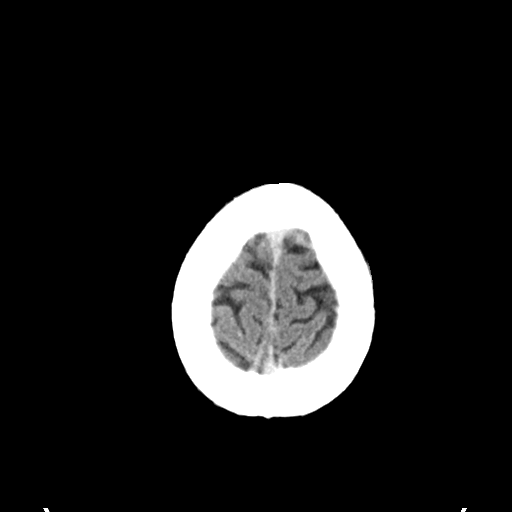
[im 28/30  brain]
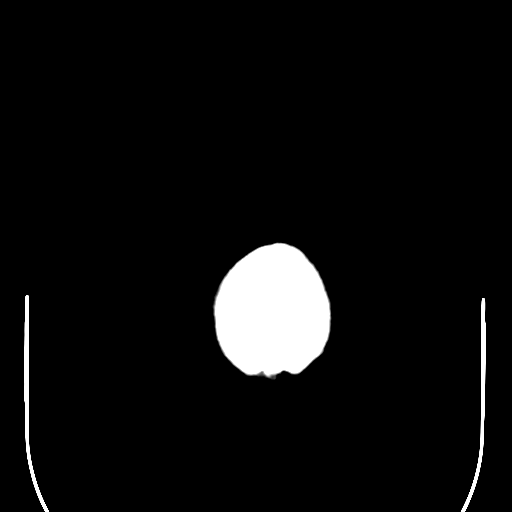
[im 28/30  bone]
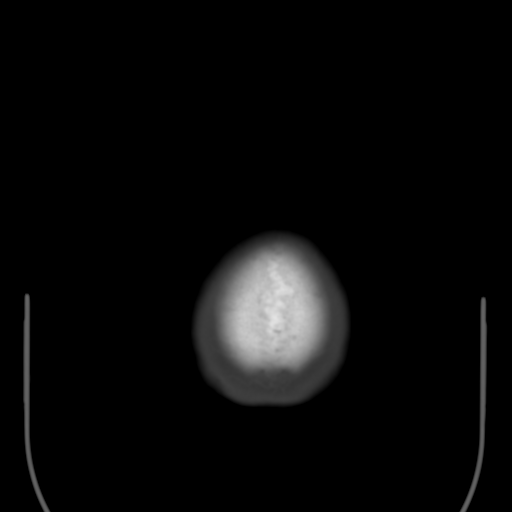

[Series 4: coronal soft tissue · coronal · 0.27mm/px · 3 of 62 slices shown]
[im 21/62  brain]
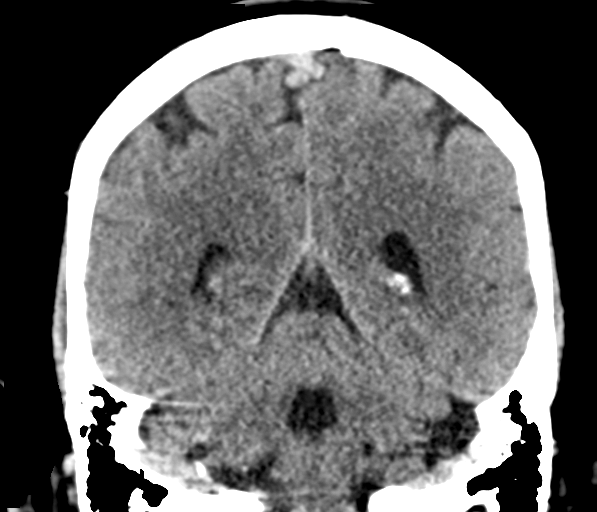
[im 28/62  brain]
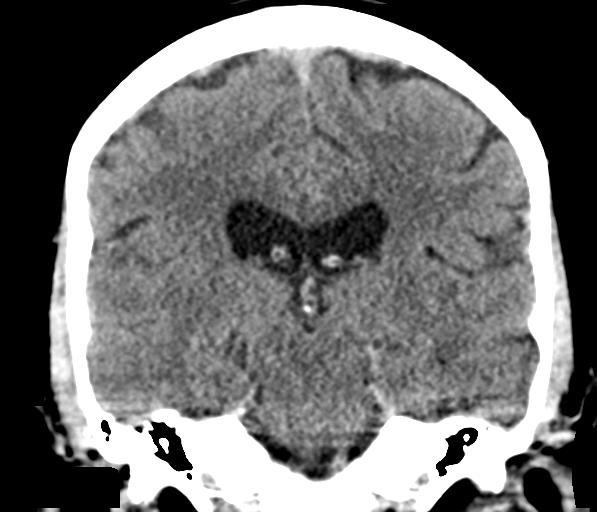
[im 34/62  brain]
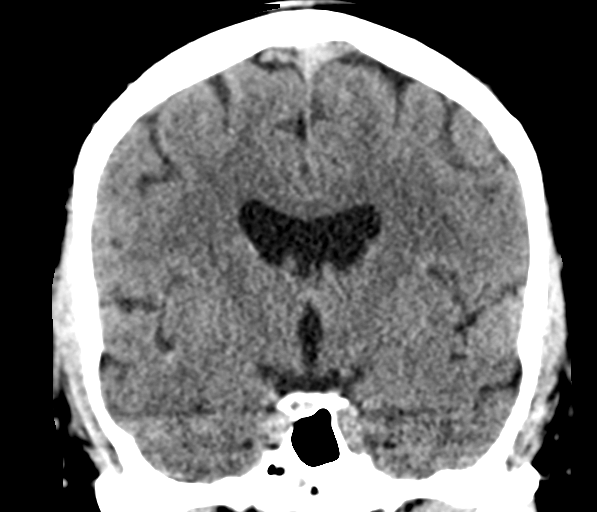

[Series 5: sagittal soft tissue · sagittal · 0.29mm/px · 3 of 47 slices shown]
[im 16/47  brain]
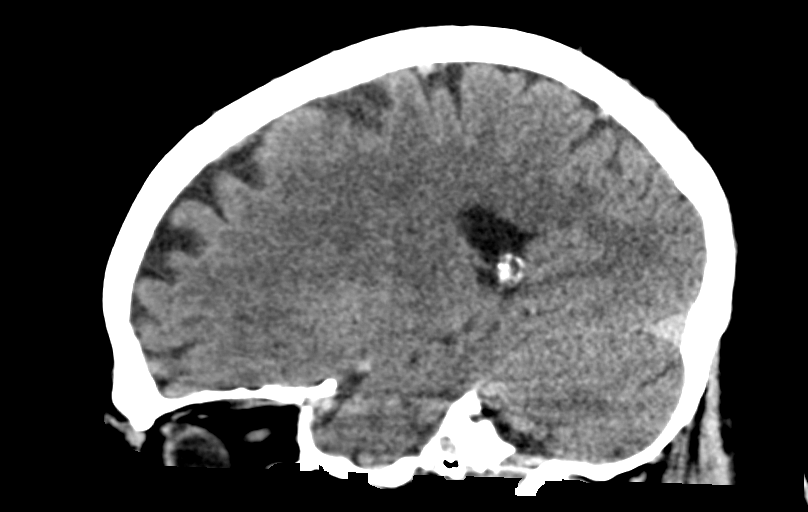
[im 24/47  brain]
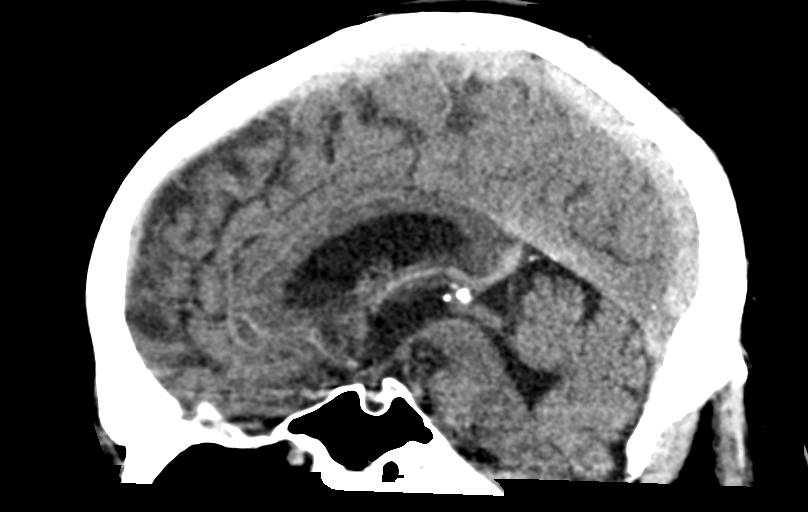
[im 31/47  brain]
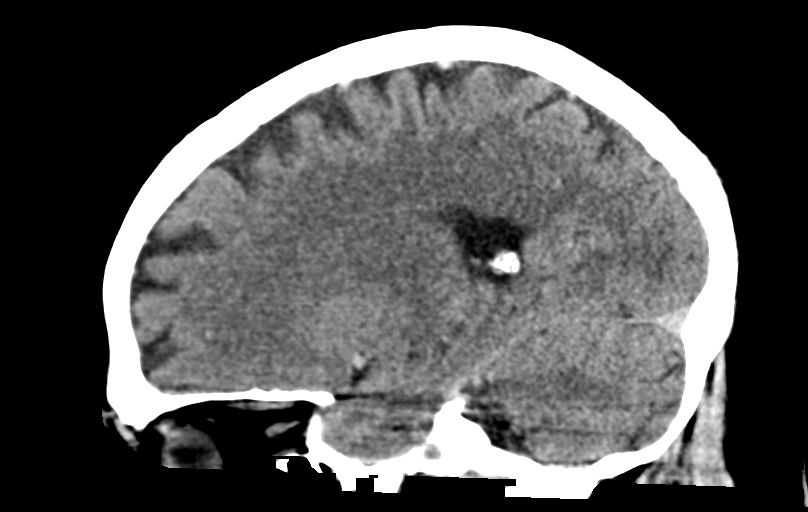

[15 of 47 positions shown; findings below may reference images not displayed]

FINDINGS: Brain: No evidence of acute infarction, hemorrhage, hydrocephalus,
extra-axial collection or mass lesion/mass effect.

Vascular: No hyperdense vessel or unexpected calcification.
Atherosclerosis of skullbase vasculature.

Skull: Normal. Negative for fracture or focal lesion.

Sinuses/Orbits: Chronic paranasal sinus inflammation, stable from
prior. No fluid level. Mastoid air cells are clear.
IMPRESSION: No acute intracranial abnormality.

## 2017-06-30 IMAGING — MR MR CERVICAL SPINE W/O CM
5 series · 35 of 48 positions shown · non-contrast
Comparison: Brain MRI and intracranial MRA a 09/01/2016. Cervical
spine MRI 05/04/2015.

CLINICAL DATA: 55-year-old male with new onset right side numbness
and weakness. Initial encounter.

EXAM:
MRI CERVICAL SPINE WITHOUT CONTRAST
TECHNIQUE: Multiplanar, multisequence MR imaging of the cervical spine was
performed. No intravenous contrast was administered.

[Series 3: T2 · sagittal · 3.0mm · 0.70mm/px · 8 of 15 slices shown (1 of 2)]
[im 1/15]
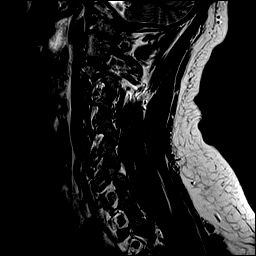
[im 3/15]
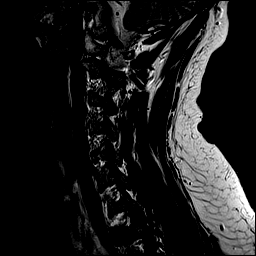
[im 5/15]
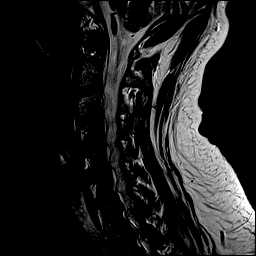
[im 7/15]
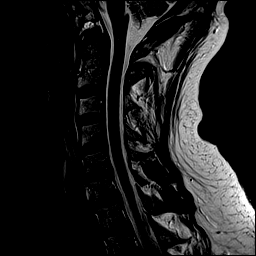
[im 9/15]
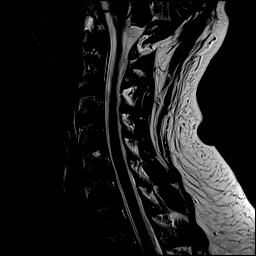
[im 11/15]
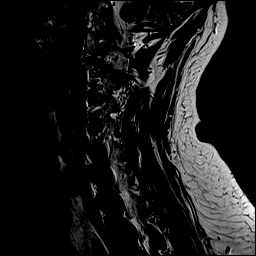
[im 13/15]
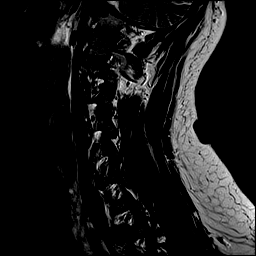
[im 15/15]
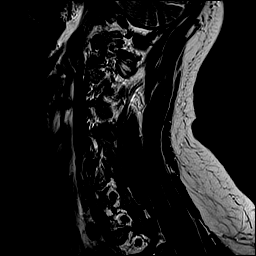

[Series 4: T1 · sagittal · 3.0mm · 0.70mm/px · 7 of 15 slices shown]
[im 1/15]
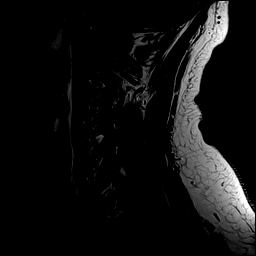
[im 3/15]
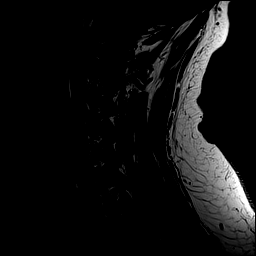
[im 5/15]
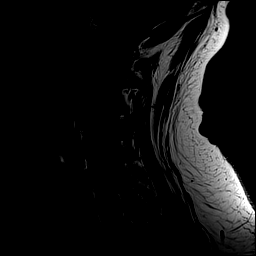
[im 8/15]
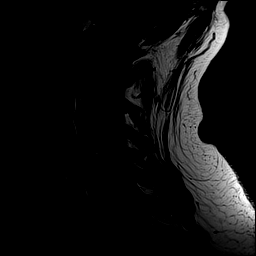
[im 10/15]
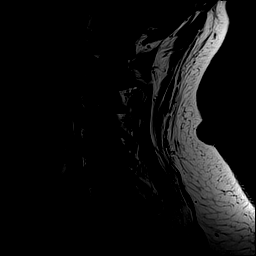
[im 12/15]
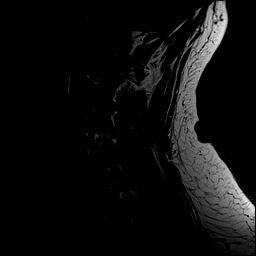
[im 15/15]
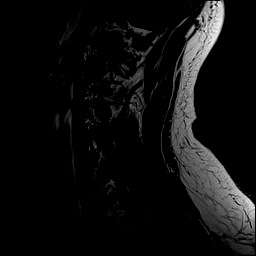

[Series 5: STIR · sagittal · 3.0mm · 0.35mm/px · 7 of 15 slices shown]
[im 1/15]
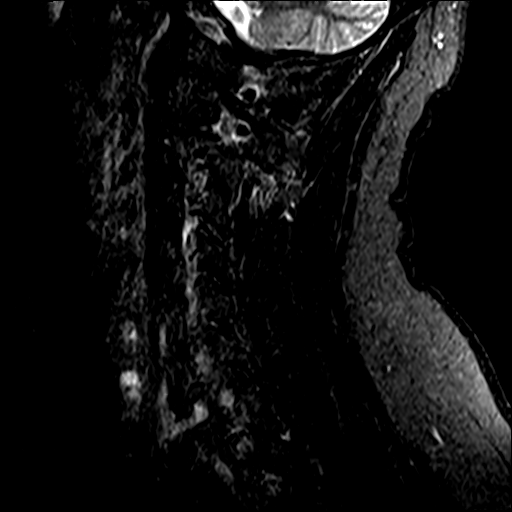
[im 3/15]
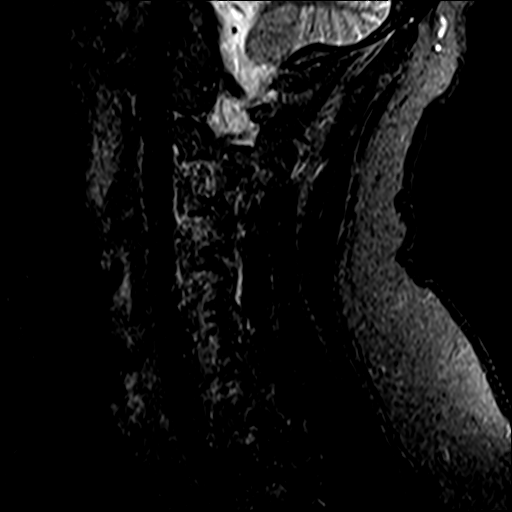
[im 5/15]
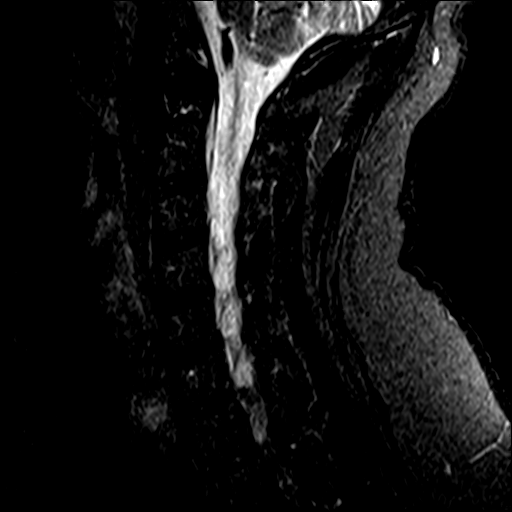
[im 8/15]
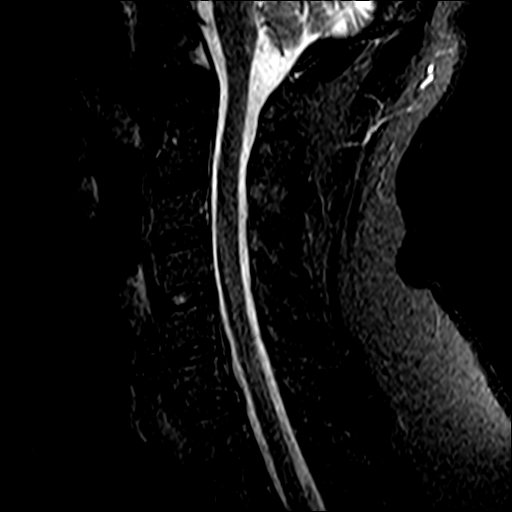
[im 10/15]
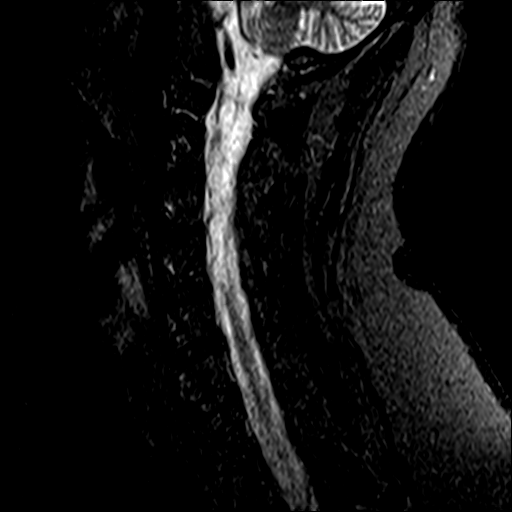
[im 12/15]
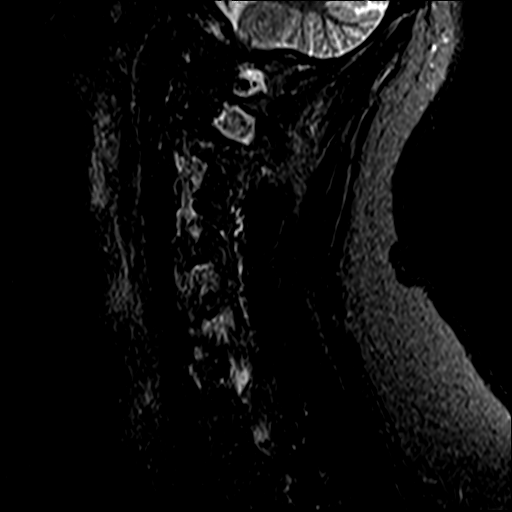
[im 15/15]
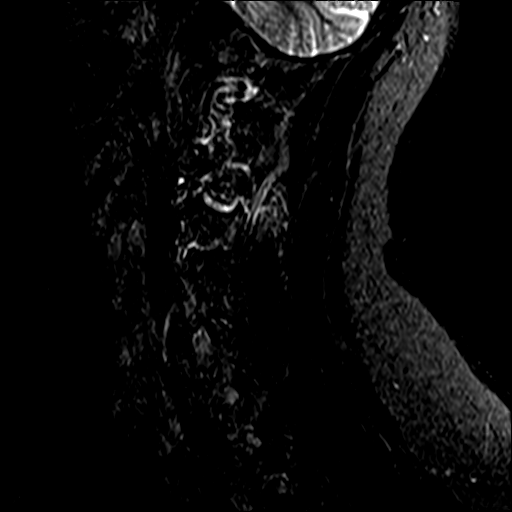

[Series 6: T2 · axial · 3.0mm · 0.70mm/px · z∈[-74,+26]mm · 9 of 27 slices shown (2 of 2)]
[im 1/27]
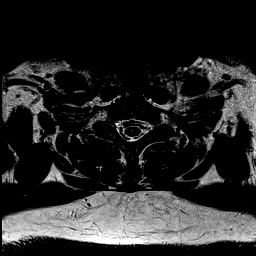
[im 5/27]
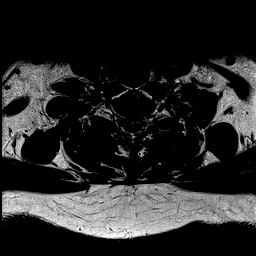
[im 9/27]
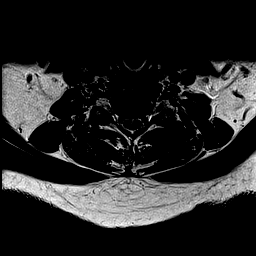
[im 11/27]
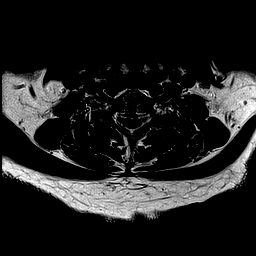
[im 14/27]
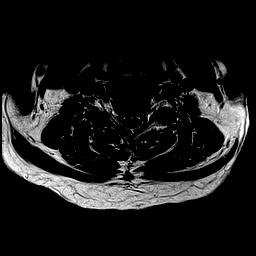
[im 16/27]
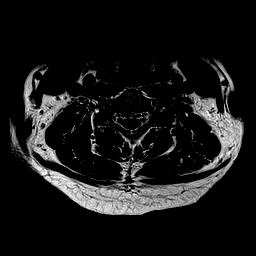
[im 18/27]
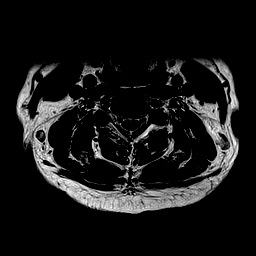
[im 22/27]
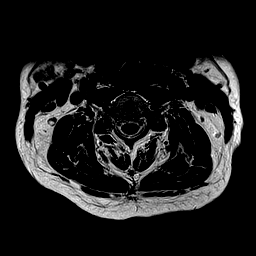
[im 27/27]
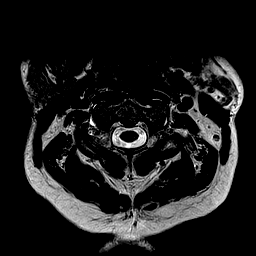

[Series 7: mpgr ax · axial · 3.0mm · 0.35mm/px · z∈[-74,-35]mm · 4 of 27 slices shown]
[im 1/27]
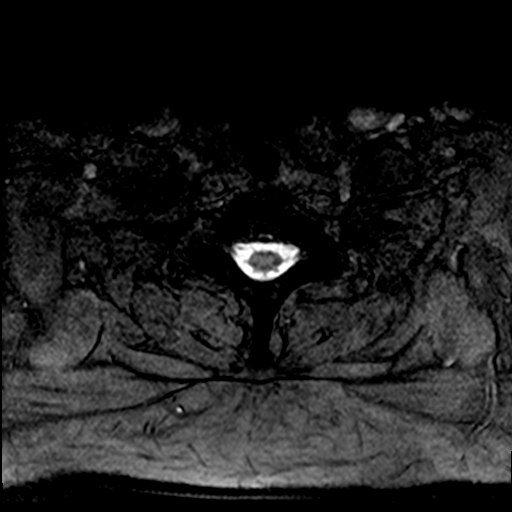
[im 5/27]
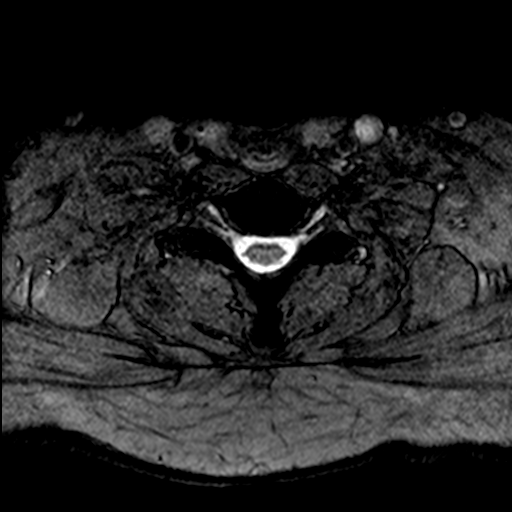
[im 9/27]
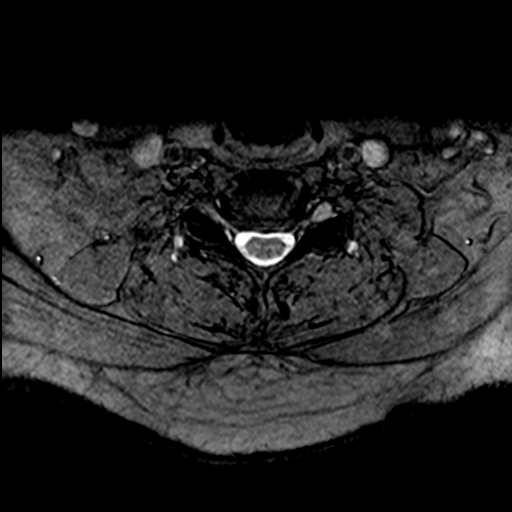
[im 11/27]
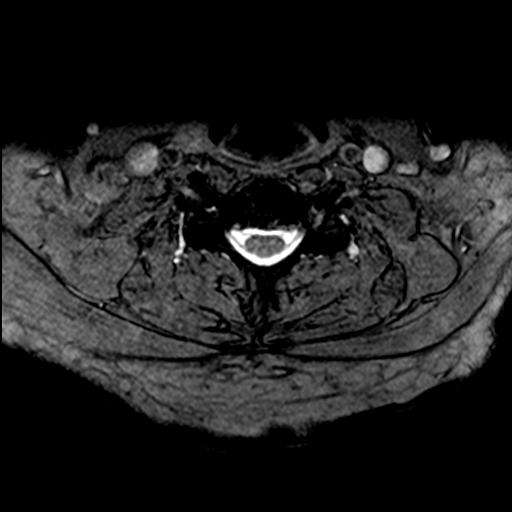

[35 of 48 positions shown; findings below may reference images not displayed]

FINDINGS: Alignment: Stable since 5830, relatively preserved cervical
lordosis.

Vertebrae: No marrow edema or evidence of acute osseous abnormality.
Incidental C5 superior endplate benign vertebral hemangioma re-
demonstrated.

Cord: Spinal cord signal is within normal limits at all visualized
levels.

Posterior Fossa, vertebral arteries, paraspinal tissues:
Cervicomedullary junction is within normal limits. Sub cm T2
hyperintense bilateral thyroid nodules do not meet consensus
criteria for ultrasound follow-up. Otherwise negative neck soft
tissues. Preserved major vascular flow voids. Mildly dominant right
vertebral artery.

Disc levels:

C2-C3:  Mild left facet and uncovertebral hypertrophy.

C3-C4:  Mild facet hypertrophy.

C4-C5:  Mild facet hypertrophy.

C5-C6: Mild circumferential disc bulge. Broad-based posterior
component. Mild uncovertebral and facet hypertrophy. No spinal or
significant neural foraminal stenosis.

C6-C7: Minimal disc bulge. Small central annular fissure of the disc
(series 6, image 24). No stenosis.

C7-T1:  Negative.

No upper thoracic spinal stenosis is evident.
IMPRESSION: Mild for age cervical spine degeneration is stable since 5830 with
no associated spinal stenosis or convincing neural impingement.

## 2017-07-12 ENCOUNTER — Encounter: Payer: Self-pay | Admitting: Radiology

## 2017-07-12 ENCOUNTER — Emergency Department
Admission: EM | Admit: 2017-07-12 | Discharge: 2017-07-13 | Disposition: A | Discharge status: Home / Self Care | Payer: Medicaid Other | Attending: Emergency Medicine | Admitting: Emergency Medicine

## 2017-07-12 ENCOUNTER — Emergency Department: Payer: Medicaid Other

## 2017-07-12 DIAGNOSIS — F10920 Alcohol use, unspecified with intoxication, uncomplicated: Secondary | ICD-10-CM

## 2017-07-12 DIAGNOSIS — R079 Chest pain, unspecified: Secondary | ICD-10-CM | POA: Diagnosis present

## 2017-07-12 DIAGNOSIS — R0789 Other chest pain: Secondary | ICD-10-CM | POA: Insufficient documentation

## 2017-07-12 DIAGNOSIS — R4781 Slurred speech: Secondary | ICD-10-CM | POA: Diagnosis not present

## 2017-07-12 DIAGNOSIS — R109 Unspecified abdominal pain: Secondary | ICD-10-CM | POA: Insufficient documentation

## 2017-07-12 DIAGNOSIS — F1490 Cocaine use, unspecified, uncomplicated: Secondary | ICD-10-CM | POA: Insufficient documentation

## 2017-07-12 DIAGNOSIS — F1910 Other psychoactive substance abuse, uncomplicated: Secondary | ICD-10-CM

## 2017-07-12 DIAGNOSIS — F10229 Alcohol dependence with intoxication, unspecified: Secondary | ICD-10-CM | POA: Diagnosis not present

## 2017-07-12 LAB — COMPREHENSIVE METABOLIC PANEL
ALBUMIN: 4 g/dL (ref 3.5–5.0)
ALT: 70 U/L — ABNORMAL HIGH (ref 17–63)
ANION GAP: 12 (ref 5–15)
AST: 48 U/L — AB (ref 15–41)
Alkaline Phosphatase: 53 U/L (ref 38–126)
BILIRUBIN TOTAL: 0.7 mg/dL (ref 0.3–1.2)
BUN: 11 mg/dL (ref 6–20)
CHLORIDE: 105 mmol/L (ref 101–111)
CO2: 23 mmol/L (ref 22–32)
Calcium: 9.1 mg/dL (ref 8.9–10.3)
Creatinine, Ser: 0.97 mg/dL (ref 0.61–1.24)
GFR calc Af Amer: >60 mL/min (ref >60)
Glucose, Bld: 116 mg/dL — ABNORMAL HIGH (ref 65–99)
POTASSIUM: 4 mmol/L (ref 3.5–5.1)
Sodium: 140 mmol/L (ref 135–145)
TOTAL PROTEIN: 7.3 g/dL (ref 6.5–8.1)

## 2017-07-12 LAB — URINALYSIS, COMPLETE (UACMP) WITH MICROSCOPIC
Bacteria, UA: NONE SEEN
Bilirubin Urine: NEGATIVE
GLUCOSE, UA: NEGATIVE mg/dL
HGB URINE DIPSTICK: NEGATIVE
Ketones, ur: NEGATIVE mg/dL
Leukocytes, UA: NEGATIVE
Nitrite: NEGATIVE
PH: 5 (ref 5.0–8.0)
Protein, ur: NEGATIVE mg/dL
RBC / HPF: NONE SEEN RBC/hpf (ref 0–5)
SPECIFIC GRAVITY, URINE: 1.002 — AB (ref 1.005–1.030)
Squamous Epithelial / LPF: NONE SEEN
WBC, UA: NONE SEEN WBC/hpf (ref 0–5)

## 2017-07-12 LAB — CBC WITH DIFFERENTIAL/PLATELET
BASOS ABS: 0.1 10*3/uL (ref 0–0.1)
BASOS PCT: 1 %
EOS ABS: 0.2 10*3/uL (ref 0–0.7)
EOS PCT: 1 %
HEMATOCRIT: 53.6 % — AB (ref 40.0–52.0)
Hemoglobin: 18.1 g/dL — ABNORMAL HIGH (ref 13.0–18.0)
Lymphocytes Relative: 30 %
Lymphs Abs: 3.6 10*3/uL (ref 1.0–3.6)
MCH: 30.3 pg (ref 26.0–34.0)
MCHC: 33.7 g/dL (ref 32.0–36.0)
MCV: 90 fL (ref 80.0–100.0)
MONO ABS: 0.8 10*3/uL (ref 0.2–1.0)
MONOS PCT: 6 %
Neutro Abs: 7.4 10*3/uL — ABNORMAL HIGH (ref 1.4–6.5)
Neutrophils Relative %: 62 %
PLATELETS: 176 10*3/uL (ref 150–440)
RBC: 5.96 MIL/uL — ABNORMAL HIGH (ref 4.40–5.90)
RDW: 13.5 % (ref 11.5–14.5)
WBC: 12 10*3/uL — ABNORMAL HIGH (ref 3.8–10.6)

## 2017-07-12 LAB — URINE DRUG SCREEN, QUALITATIVE (ARMC ONLY)
Amphetamines, Ur Screen: NOT DETECTED
BARBITURATES, UR SCREEN: NOT DETECTED
BENZODIAZEPINE, UR SCRN: NOT DETECTED
CANNABINOID 50 NG, UR ~~LOC~~: NOT DETECTED
Cocaine Metabolite,Ur ~~LOC~~: POSITIVE — AB
MDMA (Ecstasy)Ur Screen: NOT DETECTED
Methadone Scn, Ur: NOT DETECTED
Opiate, Ur Screen: NOT DETECTED
PHENCYCLIDINE (PCP) UR S: NOT DETECTED
TRICYCLIC, UR SCREEN: NOT DETECTED

## 2017-07-12 LAB — ACETAMINOPHEN LEVEL

## 2017-07-12 LAB — ETHANOL: ALCOHOL ETHYL (B): 171 mg/dL — AB (ref <5)

## 2017-07-12 LAB — TROPONIN I

## 2017-07-12 LAB — SALICYLATE LEVEL

## 2017-07-12 LAB — LIPASE, BLOOD: LIPASE: 30 U/L (ref 11–51)

## 2017-07-12 MED ORDER — SODIUM CHLORIDE 0.9 % IV BOLUS (SEPSIS)
1000.0000 mL | Freq: Once | INTRAVENOUS | Status: AC
  Administered 2017-07-12: 1000 mL via INTRAVENOUS

## 2017-07-12 MED ORDER — IOPAMIDOL (ISOVUE-370) INJECTION 76%
100.0000 mL | Freq: Once | INTRAVENOUS | Status: AC | PRN
  Administered 2017-07-12: 100 mL via INTRAVENOUS

## 2017-07-12 NOTE — ED Notes (Signed)
CT called waiting on lab on angio

## 2017-07-12 NOTE — ED Provider Notes (Signed)
[facility] Emergency Department Provider Note  ____________________________________________  Time seen: Approximately 11:05 PM  I have reviewed the triage vital signs and the nursing notes.   HISTORY  Chief Complaint Chest Pain  Level 5 caveat:  Portions of the history and physical were unable to be obtained due to: Altered mental status from intoxication, poor historian   HPI Ian [name] is a 56 y.o. male brought in via EMS due to complaint of chest pain and fall. Patient reports that he had 2 40s of beerplus a few more cans tonight. Complains of chest pain which is waxing and waning, radiating from chest down the abdomen. No aggravating or alleviating factors. Severe at its worse. When asked if he has been using cocaine recently he says "maybe." He has a history of left-sided weakness from prior stroke, but he feels like he is having increased word finding difficulty. He admits that he has no idea what day or time it is or when the symptoms started.     Past Medical History:  Diagnosis Date  . Anxiety   . Barrett esophagus   . Cancer ([place])   . Coronary artery disease   . Depression   . GERD (gastroesophageal reflux disease)   . Hypertension   . Stroke [facility]) 2014   "mini-stroke" per patient     Patient Active Problem List   Diagnosis Date Noted  . Dyslipidemia 01/07/2017  . Barrett's esophagus 01/07/2017  . Bipolar I disorder, most recent episode depressed with anxious distress ([place]) 01/06/2017  . Cannabis use disorder, moderate, dependence ([place]) 01/06/2017  . Tobacco use disorder 01/06/2017  . Alcohol use disorder, moderate, dependence ([place]) 10/06/2016  . Substance induced mood disorder ([place]) 10/06/2016  . Cocaine use disorder, moderate, dependence ([place]) 10/06/2016  . Depression 10/06/2016  . Acute left-sided weakness 05/03/2015  . CVA (cerebral infarction) 05/03/2015  . Weakness 11/16/2013  . Chest pain 11/16/2013  . HTN  (hypertension) 11/16/2013  . Left-sided weakness 11/16/2013  . Torsades de pointes ([place]) 11/16/2013  . Hemiplegia, unspecified, affecting nondominant side 11/15/2013  . Speech and language deficits 11/15/2013     Past Surgical History:  Procedure Laterality Date  . ANGIOPLASTY    . APPENDECTOMY    . CARDIAC CATHETERIZATION    . CHOLECYSTECTOMY    . WRIST SURGERY Right    age 84     Prior to Admission medications   Medication Sig Start Date End Date Taking? Authorizing Provider  aspirin 81 MG EC tablet Take 1 tablet (81 mg total) by mouth daily. 01/13/17   Pucilowska, Herma Ard B, MD  atorvastatin (LIPITOR) 40 MG tablet Take 1 tablet (40 mg total) by mouth daily at 6 PM. 01/13/17 02/12/17  Pucilowska, Jolanta B, MD  buPROPion (WELLBUTRIN XL) 150 MG 24 hr tablet Take 1 tablet (150 mg total) by mouth daily. 01/13/17   Pucilowska, Wardell Honour, MD  calcium carbonate (TUMS) 500 MG chewable tablet Chew 2 tablets (400 mg of elemental calcium total) by mouth 3 (three) times daily as needed for indigestion or heartburn. 01/04/17 01/04/18  Carrie Mew, MD  lisinopril (PRINIVIL,ZESTRIL) 10 MG tablet Take 1 tablet (10 mg total) by mouth daily. 01/13/17   Pucilowska, Herma Ard B, MD  Melatonin 5 MG TABS Take 2 tablets (10 mg total) by mouth at bedtime. 01/13/17   Pucilowska, Herma Ard B, MD  QUEtiapine (SEROQUEL XR) 300 MG 24 hr tablet Take 1 tablet (300 mg total) by mouth at bedtime. 01/13/17   Pucilowska, Zimmerman,  MD  QUEtiapine (SEROQUEL) 50 MG tablet Take 1 tablet (50 mg total) by mouth 2 (two) times daily. 01/13/17   Pucilowska, Wardell Honour, MD     Allergies Fluoxetine and Hydrocodone-acetaminophen   Family History  Problem Relation Age of Onset  . Stroke Father   . Leukemia Father   . Breast cancer Sister     Social History Social History  Substance Use Topics  . Smoking status: Current Every Day Smoker    Packs/day: 1.00    Types: Cigarettes  . Smokeless tobacco: Never Used  . Alcohol  use Yes     Comment: denies being a daily drinker but did have alcohol today    Review of Systems  Constitutional:   No fever or chills.  ENT:   No sore throat. No rhinorrhea. Cardiovascular:   Positive as above for chest pain without syncope. Respiratory:   No dyspnea or cough. Gastrointestinal:   Negative for abdominal pain, vomiting and diarrhea.  Musculoskeletal:   Negative for focal pain or swelling All other systems reviewed and are negative except as documented above in ROS and HPI.  ____________________________________________   PHYSICAL EXAM:  VITAL SIGNS: ED Triage Vitals  Enc Vitals Group     BP 07/12/17 2102 (!) 146/108     Pulse Rate 07/12/17 2102 (!) 110     Resp 07/12/17 2102 (!) 23     Temp 07/12/17 2102 98.2 F (36.8 C)     Temp Source 07/12/17 2102 Axillary     SpO2 07/12/17 2102 94 %     Weight 07/12/17 2104 250 lb (113.4 kg)     Height 07/12/17 2104 6\' 1"  (1.854 m)     Head Circumference --      Peak Flow --      Pain Score 07/12/17 2129 10     Pain Loc --      Pain Edu? --      Excl. in [place]? --     Vital signs reviewed, nursing assessments reviewed.   Constitutional:   Alert and orientedTo person and place. Not in distress. Eyes:   No scleral icterus.  EOMI. No nystagmus. No conjunctival pallor. PERRL. ENT   Head:   Normocephalic and atraumatic.   Nose:   No congestion/rhinnorhea. No epistaxis.    Mouth/Throat:   Dry mucous membranes, no pharyngeal erythema. No peritonsillar mass. Alcohol on breath   Neck:   No meningismus. Full ROM. no midline tenderness Hematological/Lymphatic/Immunilogical:   No cervical lymphadenopathy. Cardiovascular:   Tachycardia heart rate 110. Symmetric bilateral radial and DP pulses.  No murmurs.  Respiratory:   Normal respiratory effort without tachypnea/retractions. Breath sounds are clear and equal bilaterally. No wheezes/rales/rhonchi. Gastrointestinal:   Soft and nontender. Non distended. There is no  CVA tenderness.  No rebound, rigidity, or guarding. Genitourinary:   deferred Musculoskeletal:   Normal range of motion in all extremities. No joint effusions.  No lower extremity tenderness.  No edema. Neurologic:   Slurred speech. Normal language.  5 out of 5 strength in right upper extremity and lower extremity 2 out of 5 strength in left upper extremity and left lower extremity, chronic per patient In a stroke scale 4, 2 points for left-sided weakness which is likely chronic. 2 points for slurred speech and disorientation which is likely due to alcohol intoxication. No gross focal neurologic deficits are appreciated.  Skin:    Skin is warm, dry and intact. No rash noted.  No petechiae, purpura, or bullae.  ____________________________________________  LABS (pertinent positives/negatives) (all labs ordered are listed, but only abnormal results are displayed) Labs Reviewed  COMPREHENSIVE METABOLIC PANEL - Abnormal; Notable for the following:       Result Value   Glucose, Bld 116 (*)    AST 48 (*)    ALT 70 (*)    All other components within normal limits  ACETAMINOPHEN LEVEL - Abnormal; Notable for the following:    Acetaminophen (Tylenol), Serum <10 (*)    All other components within normal limits  ETHANOL - Abnormal; Notable for the following:    Alcohol, Ethyl (B) 171 (*)    All other components within normal limits  CBC WITH DIFFERENTIAL/PLATELET - Abnormal; Notable for the following:    WBC 12.0 (*)    RBC 5.96 (*)    Hemoglobin 18.1 (*)    HCT 53.6 (*)    Neutro Abs 7.4 (*)    All other components within normal limits  URINE DRUG SCREEN, QUALITATIVE ([facility] ONLY) - Abnormal; Notable for the following:    Cocaine Metabolite,Ur [place] POSITIVE (*)    All other components within normal limits  URINALYSIS, COMPLETE (UACMP) WITH MICROSCOPIC - Abnormal; Notable for the following:    Color, Urine STRAW (*)    APPearance CLEAR (*)    Specific Gravity, Urine 1.002 (*)    All  other components within normal limits  LIPASE, BLOOD  SALICYLATE LEVEL  TROPONIN I   ____________________________________________   EKG  Interpreted by me Sinus tachycardia rate 1:15, normal axis intervals QRS ST segments and T waves.  ____________________________________________    RADIOLOGY  Ct Head Wo Contrast  Result Date: 07/12/2017 CLINICAL DATA:  Fall. Slurred speech. Intoxicated. Chronic left-sided weakness from prior CVA. EXAM: CT HEAD WITHOUT CONTRAST TECHNIQUE: Contiguous axial images were obtained from the base of the skull through the vertex without intravenous contrast. COMPARISON:  Most recent head CT 01/04/2017.  Brain MRI 09/01/2016 FINDINGS: Brain: Mild atrophy and chronic small vessel ischemia, stable from prior exam. No intracranial hemorrhage, mass effect, or midline shift. No hydrocephalus. The basilar cisterns are patent. No evidence of territorial infarct. No extra-axial or intracranial fluid collection. Vascular: Atherosclerosis of skullbase vasculature without hyperdense vessel or abnormal calcification. Skull: No skull fracture.  No focal lesion. Sinuses/Orbits: Chronic mucosal thickening of the ethmoid air cells and right maxillary sinus. Frontal sinuses are hypo pneumatized. Visualized orbits are unremarkable. Mastoid air cells are clear. Other: None. IMPRESSION: No acute intracranial abnormality.  No skull fracture. Electronically Signed   By: Jeb Levering M.D.   On: 07/12/2017 22:21   Ct Angio Chest Aorta W And/or Wo Contrast  Result Date: 07/12/2017 CLINICAL DATA:  Chest pain.  [employer]. EXAM: CT ANGIOGRAPHY CHEST, ABDOMEN AND PELVIS TECHNIQUE: Multidetector CT imaging through the chest, abdomen and pelvis was performed using the standard protocol during bolus administration of intravenous contrast. Multiplanar reconstructed images and MIPs were obtained and reviewed to evaluate the vascular anatomy. CONTRAST:  100 mL Isovue 370 intravenous COMPARISON:   01/04/2017 FINDINGS: CTA CHEST FINDINGS Cardiovascular: Preferential opacification of the thoracic aorta. No evidence of thoracic aortic aneurysm or dissection. Normal heart size. No pericardial effusion. Pulmonary arteries are well opacified. No embolus. Mediastinum/Nodes: No enlarged mediastinal, hilar, or axillary lymph nodes. Thyroid gland, trachea, and esophagus demonstrate no significant findings. Lungs/Pleura: Lungs are clear. No pleural effusion or pneumothorax. Musculoskeletal: No chest wall abnormality. No acute or significant osseous findings. Review of the MIP images confirms the above findings. CTA ABDOMEN AND PELVIS FINDINGS VASCULAR  Aorta: Normal caliber aorta without aneurysm, dissection, vasculitis or significant stenosis. Celiac: Patent without evidence of aneurysm, dissection, vasculitis or significant stenosis. SMA: Patent without evidence of aneurysm, dissection, vasculitis or significant stenosis. Renals: Accessory renal arteries bilaterally. All renal arteries are patent without evidence of aneurysm, dissection, vasculitis, fibromuscular dysplasia or significant stenosis. IMA: Patent without evidence of aneurysm, dissection, vasculitis or significant stenosis. Inflow: Patent without evidence of aneurysm, dissection, vasculitis or significant stenosis. Veins: No obvious venous abnormality within the limitations of this arterial phase study. Review of the MIP images confirms the above findings. NON-VASCULAR Hepatobiliary: No focal liver abnormality is seen. Status post cholecystectomy. No biliary dilatation. Pancreas: Unremarkable. No pancreatic ductal dilatation or surrounding inflammatory changes. Spleen: No splenic injury or perisplenic hematoma. Adrenals/Urinary Tract: Adrenal glands are unremarkable. Kidneys are normal, without renal calculi, focal lesion, or hydronephrosis. Bladder is unremarkable. Stomach/Bowel: Stomach is within normal limits. Appendectomy. Remainder of the colon appears  normal. No evidence of bowel wall thickening, distention, or inflammatory changes. Lymphatic: No significant vascular findings are present. No enlarged abdominal or pelvic lymph nodes. Reproductive: Prostate is unremarkable. Other: Small fat containing umbilical hernia. No peritoneal blood or free air. Musculoskeletal: No acute fracture is evident Review of the MIP images confirms the above findings. IMPRESSION: 1. No significant vascular abnormality in the chest, abdomen or pelvis. 2. No evidence of significant acute traumatic injury in the chest, abdomen or pelvis. Electronically Signed   By: Andreas Newport M.D.   On: 07/12/2017 22:29   Ct Angio Abd/pel W And/or Wo Contrast  Result Date: 07/12/2017 CLINICAL DATA:  Chest pain.  [employer]. EXAM: CT ANGIOGRAPHY CHEST, ABDOMEN AND PELVIS TECHNIQUE: Multidetector CT imaging through the chest, abdomen and pelvis was performed using the standard protocol during bolus administration of intravenous contrast. Multiplanar reconstructed images and MIPs were obtained and reviewed to evaluate the vascular anatomy. CONTRAST:  100 mL Isovue 370 intravenous COMPARISON:  01/04/2017 FINDINGS: CTA CHEST FINDINGS Cardiovascular: Preferential opacification of the thoracic aorta. No evidence of thoracic aortic aneurysm or dissection. Normal heart size. No pericardial effusion. Pulmonary arteries are well opacified. No embolus. Mediastinum/Nodes: No enlarged mediastinal, hilar, or axillary lymph nodes. Thyroid gland, trachea, and esophagus demonstrate no significant findings. Lungs/Pleura: Lungs are clear. No pleural effusion or pneumothorax. Musculoskeletal: No chest wall abnormality. No acute or significant osseous findings. Review of the MIP images confirms the above findings. CTA ABDOMEN AND PELVIS FINDINGS VASCULAR Aorta: Normal caliber aorta without aneurysm, dissection, vasculitis or significant stenosis. Celiac: Patent without evidence of aneurysm, dissection,  vasculitis or significant stenosis. SMA: Patent without evidence of aneurysm, dissection, vasculitis or significant stenosis. Renals: Accessory renal arteries bilaterally. All renal arteries are patent without evidence of aneurysm, dissection, vasculitis, fibromuscular dysplasia or significant stenosis. IMA: Patent without evidence of aneurysm, dissection, vasculitis or significant stenosis. Inflow: Patent without evidence of aneurysm, dissection, vasculitis or significant stenosis. Veins: No obvious venous abnormality within the limitations of this arterial phase study. Review of the MIP images confirms the above findings. NON-VASCULAR Hepatobiliary: No focal liver abnormality is seen. Status post cholecystectomy. No biliary dilatation. Pancreas: Unremarkable. No pancreatic ductal dilatation or surrounding inflammatory changes. Spleen: No splenic injury or perisplenic hematoma. Adrenals/Urinary Tract: Adrenal glands are unremarkable. Kidneys are normal, without renal calculi, focal lesion, or hydronephrosis. Bladder is unremarkable. Stomach/Bowel: Stomach is within normal limits. Appendectomy. Remainder of the colon appears normal. No evidence of bowel wall thickening, distention, or inflammatory changes. Lymphatic: No significant vascular findings are present. No enlarged abdominal or pelvic  lymph nodes. Reproductive: Prostate is unremarkable. Other: Small fat containing umbilical hernia. No peritoneal blood or free air. Musculoskeletal: No acute fracture is evident Review of the MIP images confirms the above findings. IMPRESSION: 1. No significant vascular abnormality in the chest, abdomen or pelvis. 2. No evidence of significant acute traumatic injury in the chest, abdomen or pelvis. Electronically Signed   By: Andreas Newport M.D.   On: 07/12/2017 22:29    ____________________________________________   PROCEDURES Procedures  ____________________________________________   INITIAL IMPRESSION /  ASSESSMENT AND PLAN / ED COURSE  Pertinent labs & imaging results that were available during my care of the patient were reviewed by me and considered in my medical decision making (see chart for details).    Clinical Course as of Jul 12 2304  Nancy Fetter Jul 12, 2017  2111 P/w intoxication, fall, pt reports increased word finding difficulty. Possible new cva, but unknown LKW time, and pt currently disoriented to time. Likely due to intoxication. No benefit to code stroke protocol due to inability to intervene without known well time  [PS]  2233 Labs reveal ethanol and cocaine. CT workup negative. Will observe until clinically sober. IVF hydration.   [PS]    Clinical Course User Index [PS] Carrie Mew, MD     ----------------------------------------- 11:33 PM on 07/12/2017 -----------------------------------------  Case signed out to Dr. Beather Arbour. Vital signs normalized.  ____________________________________________   FINAL CLINICAL IMPRESSION(S) / ED DIAGNOSES  Final diagnoses:  Nonspecific chest pain  Alcoholic intoxication without complication (HCC)  Polysubstance abuse      New Prescriptions   No medications on file     Portions of this note were generated with dragon dictation software. Dictation errors may occur despite best attempts at proofreading.    Carrie Mew, MD 07/12/17 [phone]

## 2017-07-12 NOTE — ED Notes (Signed)
Patient transported to CT 

## 2017-07-12 NOTE — Discharge Instructions (Addendum)
1. Drink alcohol only moderation. 2. Stop using illicit drugs. 3. Return to the ER for worsening symptoms, persistent vomiting, difficulty breathing or other concerns.

## 2017-07-12 NOTE — ED Triage Notes (Signed)
Patient presents to Emergency Department via [facility] with complaints of CP and fall.  Per EMS pt has been drinking and fell onto left side, and having CP with slurred speech and expressive aphasia.  Hx of CVA with right arm tremors and left side weakness.  CBG 159, 129/92, 92% RA, 113 Sinus Tach   Pt oriented to self and birth date and place, unable to give month and year.  pronounced stuttering and intermittent "pressure" on left side of chest.

## 2017-07-12 NOTE — ED Notes (Signed)
Pt denies taking drugs, when asked about cocaine pt reports snorting some yesterday, "but you make it sound like I shoot up and I don't"

## 2017-07-13 NOTE — ED Provider Notes (Signed)
-----------------------------------------   11:30 PM on 07/12/2017 -----------------------------------------  Assume care of patient who is sleeping soundly. Will let him sober up overnight and reassess in the morning.   ----------------------------------------- 6:40 AM on 07/13/2017 -----------------------------------------  Patient awake and sober, ambulatory with steady gait. Tolerating liquids well. Strict return precautions given. Patient verbalizes understanding and agrees with plan of care.   Paulette Blanch, MD 07/13/17 [phone]

## 2017-07-13 NOTE — ED Notes (Signed)
clothes and bus pass given

## 2017-08-02 IMAGING — CT CT HEAD W/O CM
3 of 6 series · 15 of 47 positions shown, 18 images · non-contrast
Comparison: MRI 09/01/2016, CT head 07/13/2015

CLINICAL DATA: Suicidal.  Slurred speech

EXAM:
CT HEAD WITHOUT CONTRAST
TECHNIQUE: Contiguous axial images were obtained from the base of the skull
through the vertex without intravenous contrast.

[Series 2: head wo · axial · 0.45mm/px · z∈[-104,+21]mm · 10 of 31 slices shown, 13 images]
[im 3/31  brain]
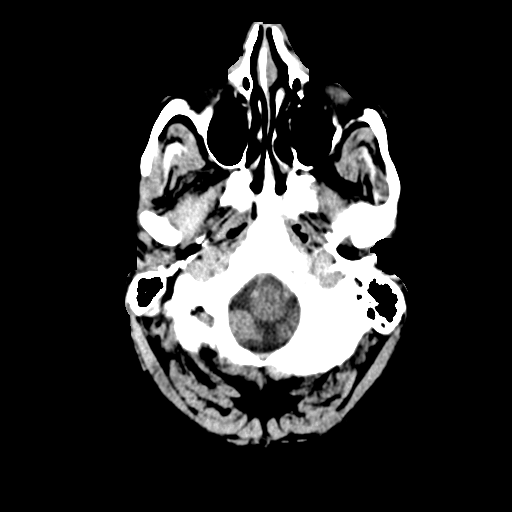
[im 3/31  bone]
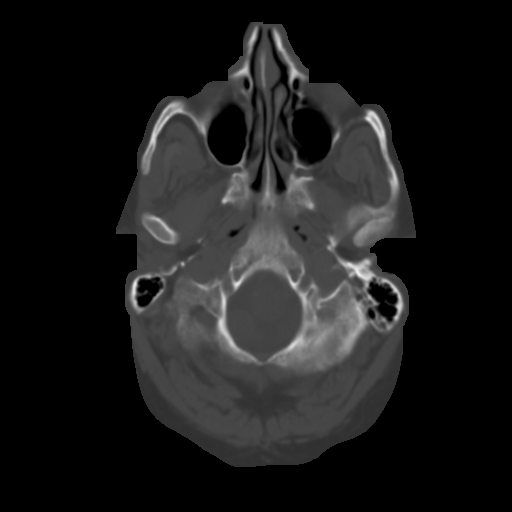
[im 5/31  brain]
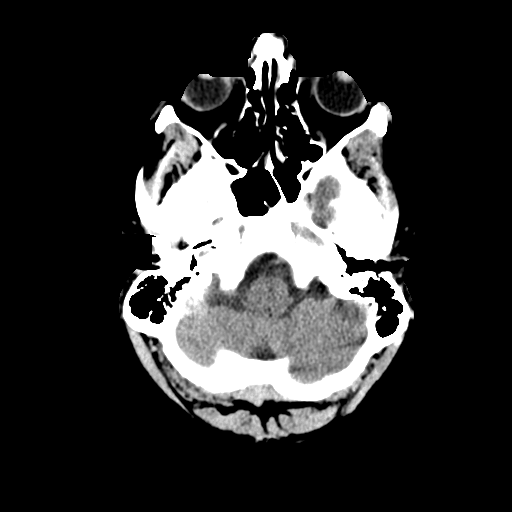
[im 9/31  brain]
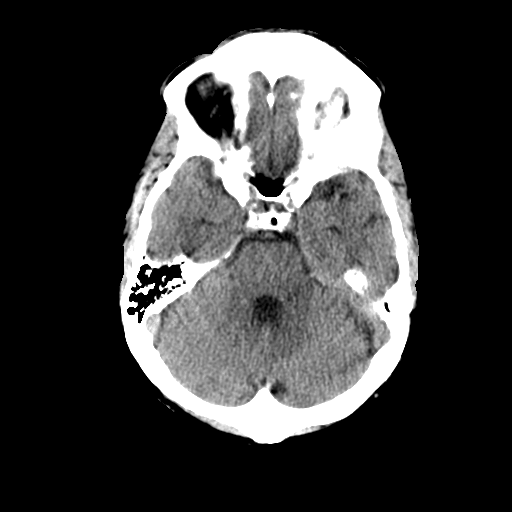
[im 11/31  brain]
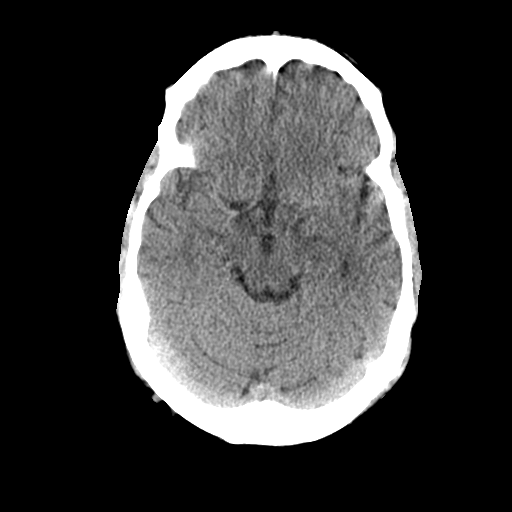
[im 13/31  brain]
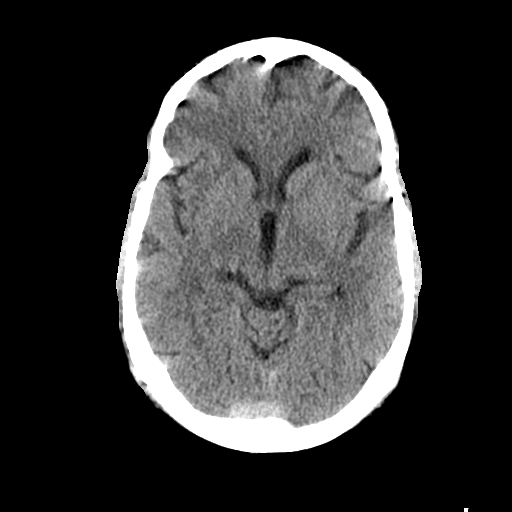
[im 13/31  bone]
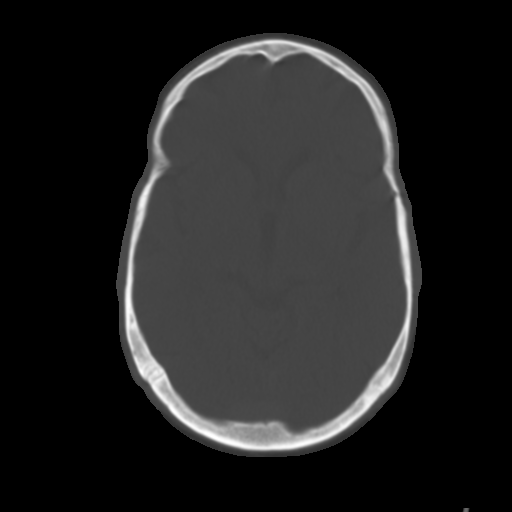
[im 18/31  brain]
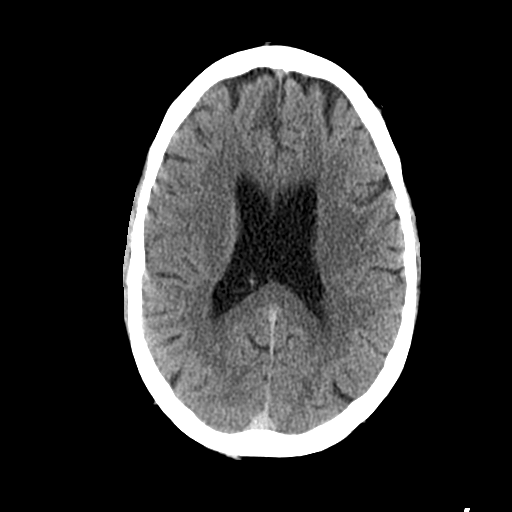
[im 20/31  brain]
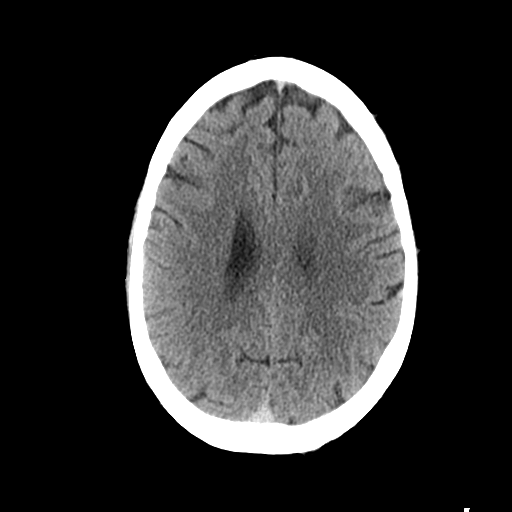
[im 22/31  brain]
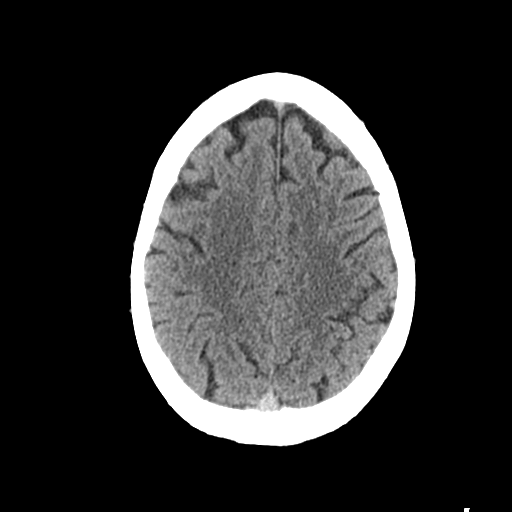
[im 26/31  brain]
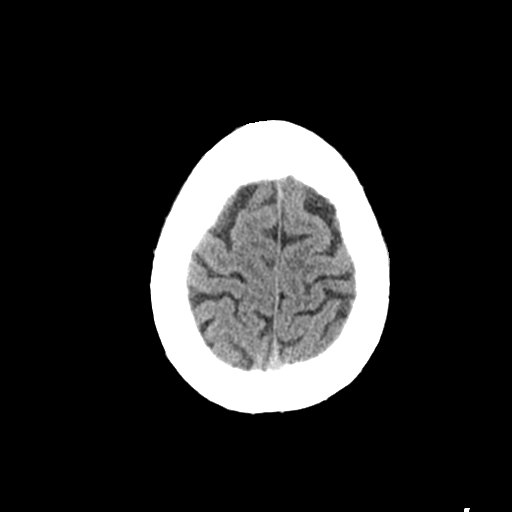
[im 26/31  bone]
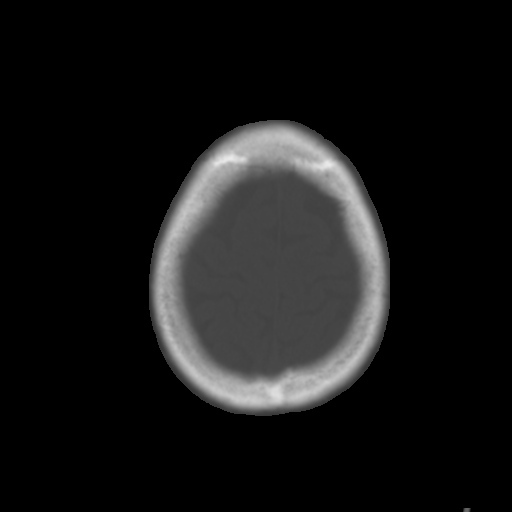
[im 28/31  brain]
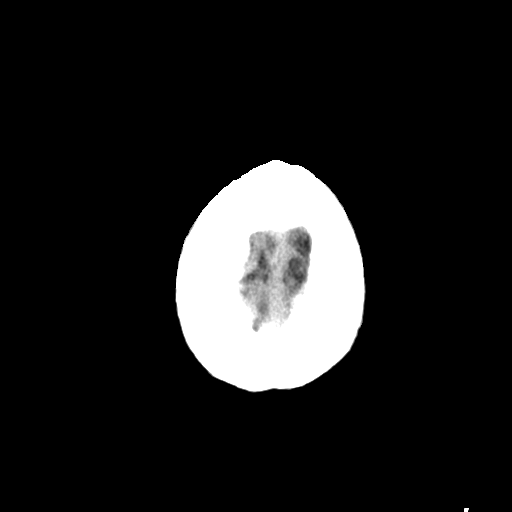

[Series 4: coronal soft tissue · coronal · 0.32mm/px · 3 of 68 slices shown]
[im 17/68  brain]
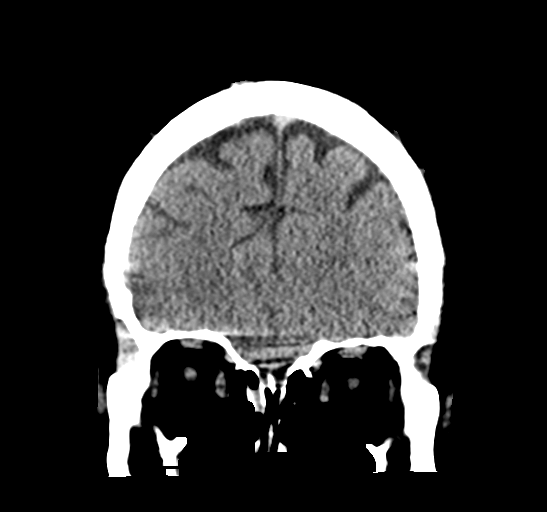
[im 34/68  brain]
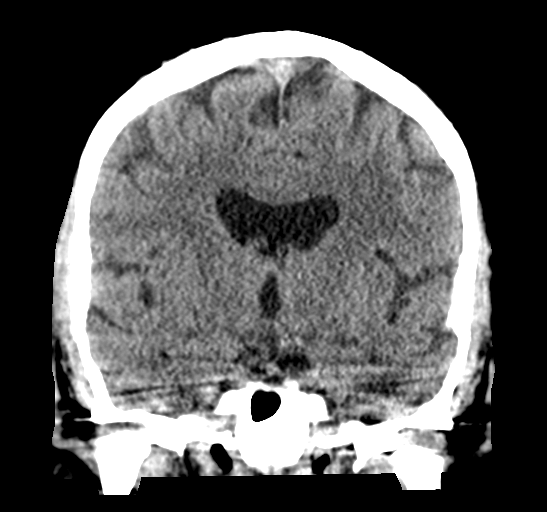
[im 51/68  brain]
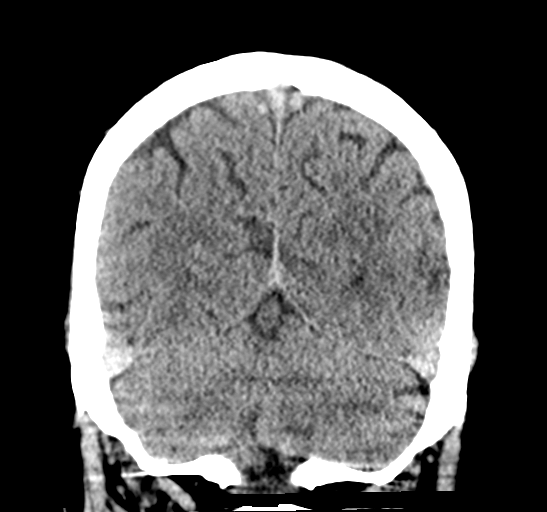

[Series 9: sagittal soft tissue · sagittal · 0.33mm/px · 2 of 52 slices shown]
[im 18/52  brain]
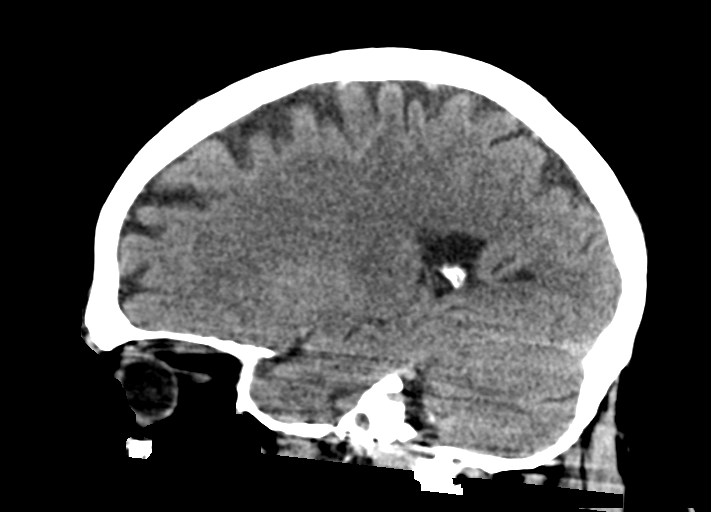
[im 35/52  brain]
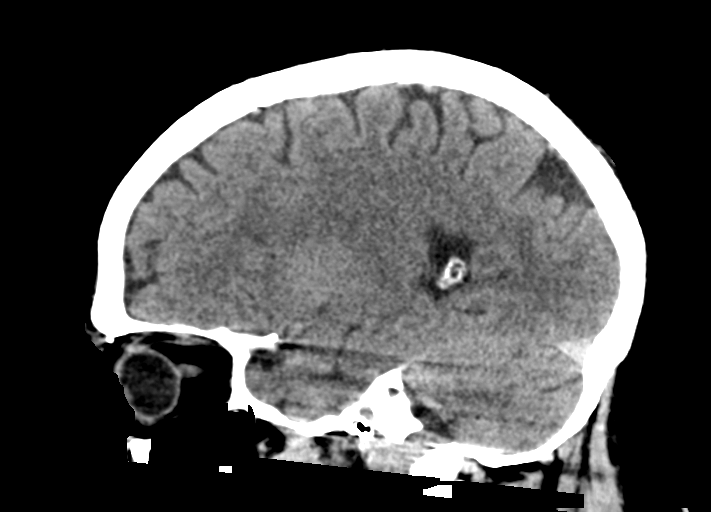

[15 of 47 positions shown; findings below may reference images not displayed]

FINDINGS: Motion and streak related artifacts limit the study especially of
the temporal lobes on the sagittal and coronal reformats.

BRAIN: The ventricles and sulci are normal. No intraparenchymal
hemorrhage, mass effect nor midline shift. No acute large vascular
territory infarcts. No abnormal extra-axial fluid collections. Basal
cisterns are patent.

VASCULAR: Unremarkable.

SKULL/SOFT TISSUES: No skull fracture. No significant soft tissue
swelling.

ORBITS/SINUSES: The included ocular globes and orbital contents are
normal.The mastoid aircells are well-aerated. Mild ethmoid and right
maxillary sinus mucosal thickening.

OTHER: Chronic stable deformity of the nasal bones possibly related
to old remote trauma.
IMPRESSION: No acute intracranial abnormality. Chronic mild paranasal sinus
mucosal thickening.

## 2017-08-02 IMAGING — CR DG CHEST 2V
1 series · 3 of 3 positions shown · non-contrast
Comparison: 08/13/2015

CLINICAL DATA: Left-sided chest pain and chest tightness.

EXAM:
CHEST  2 VIEW

[Series 1: dg chest 2 view · 0.14mm/px · 3 of 3 slices shown]
[im 1/3]
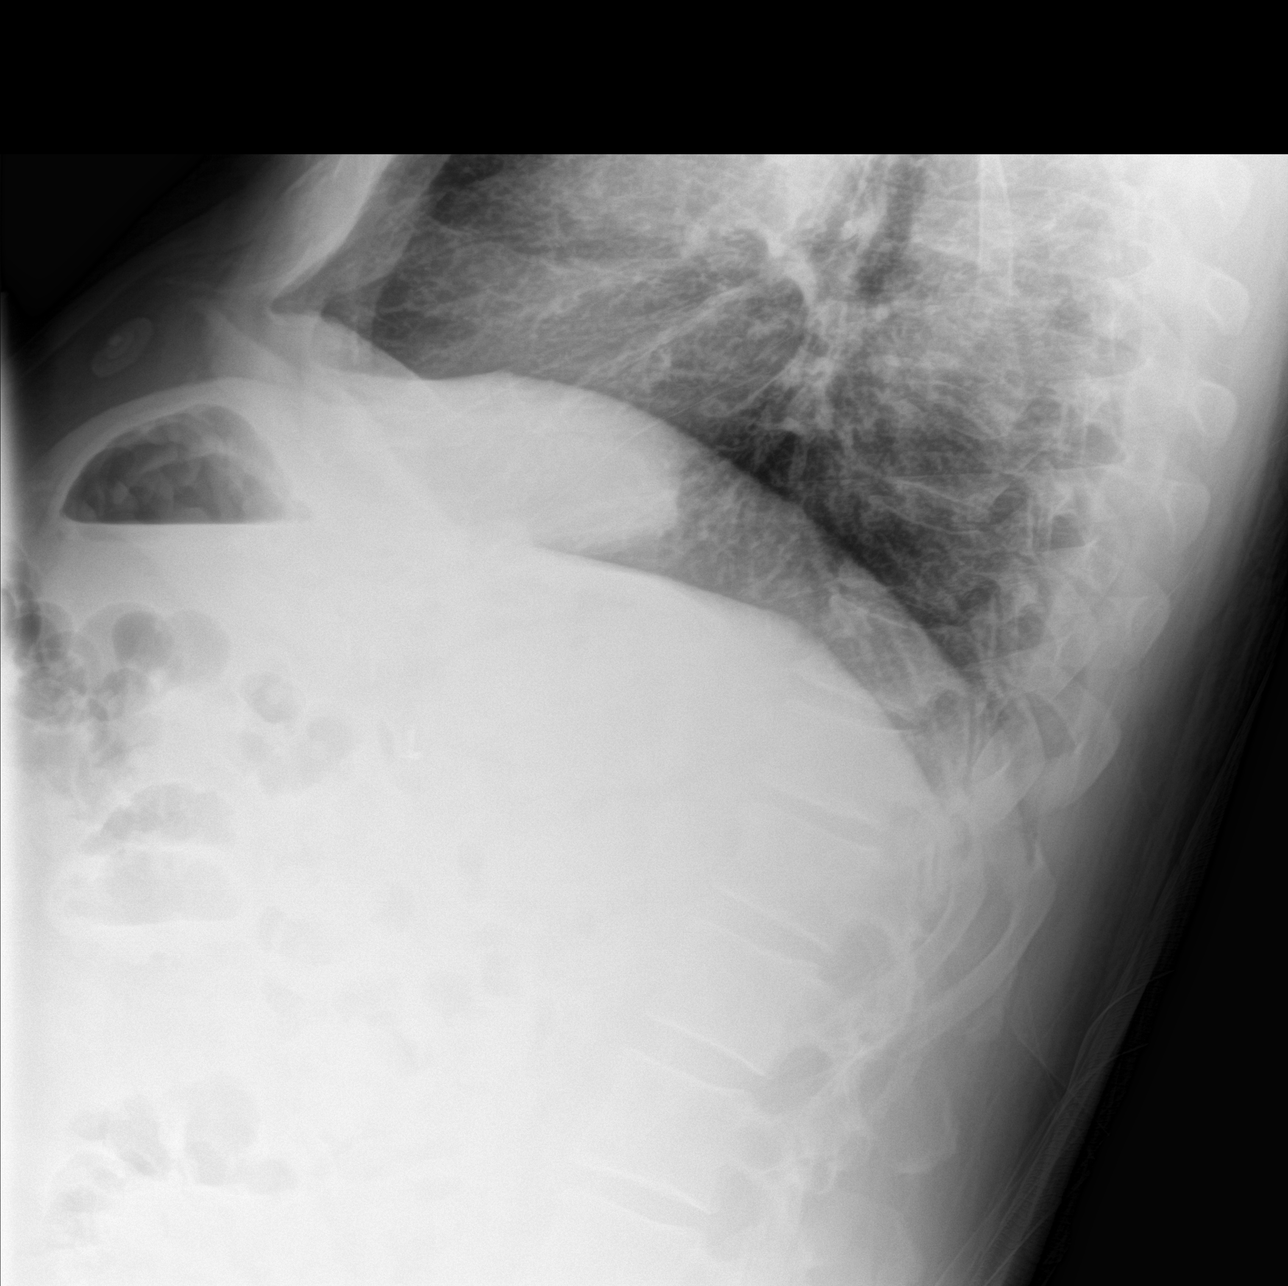
[im 2/3]
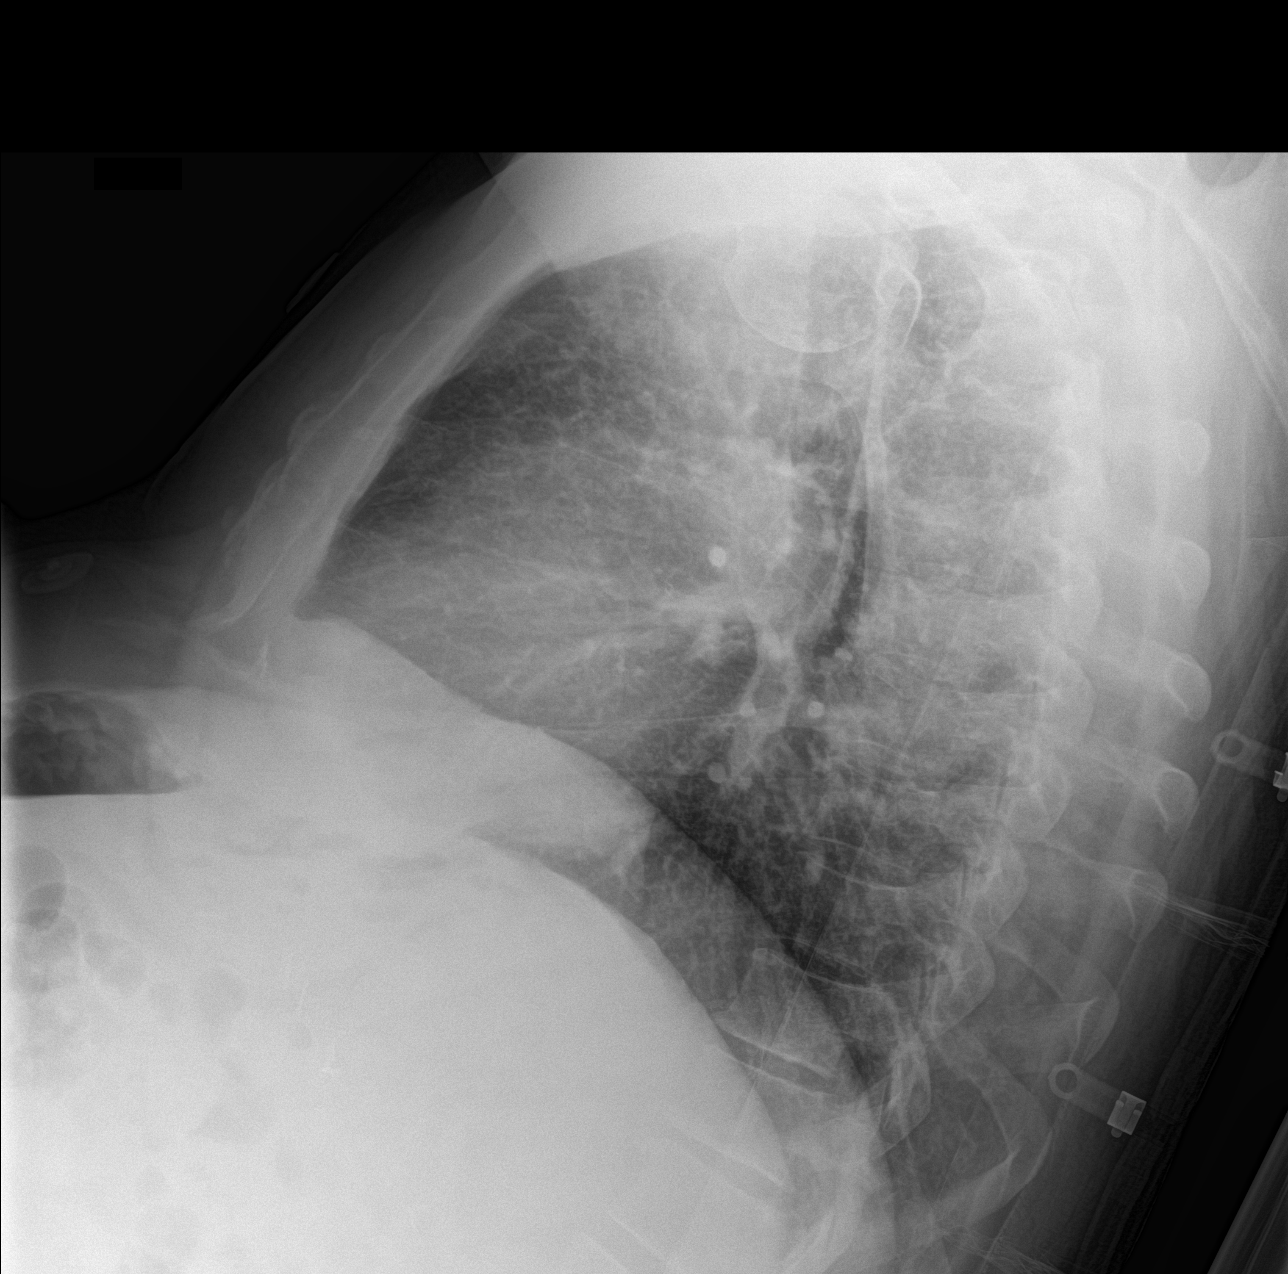
[im 3/3]
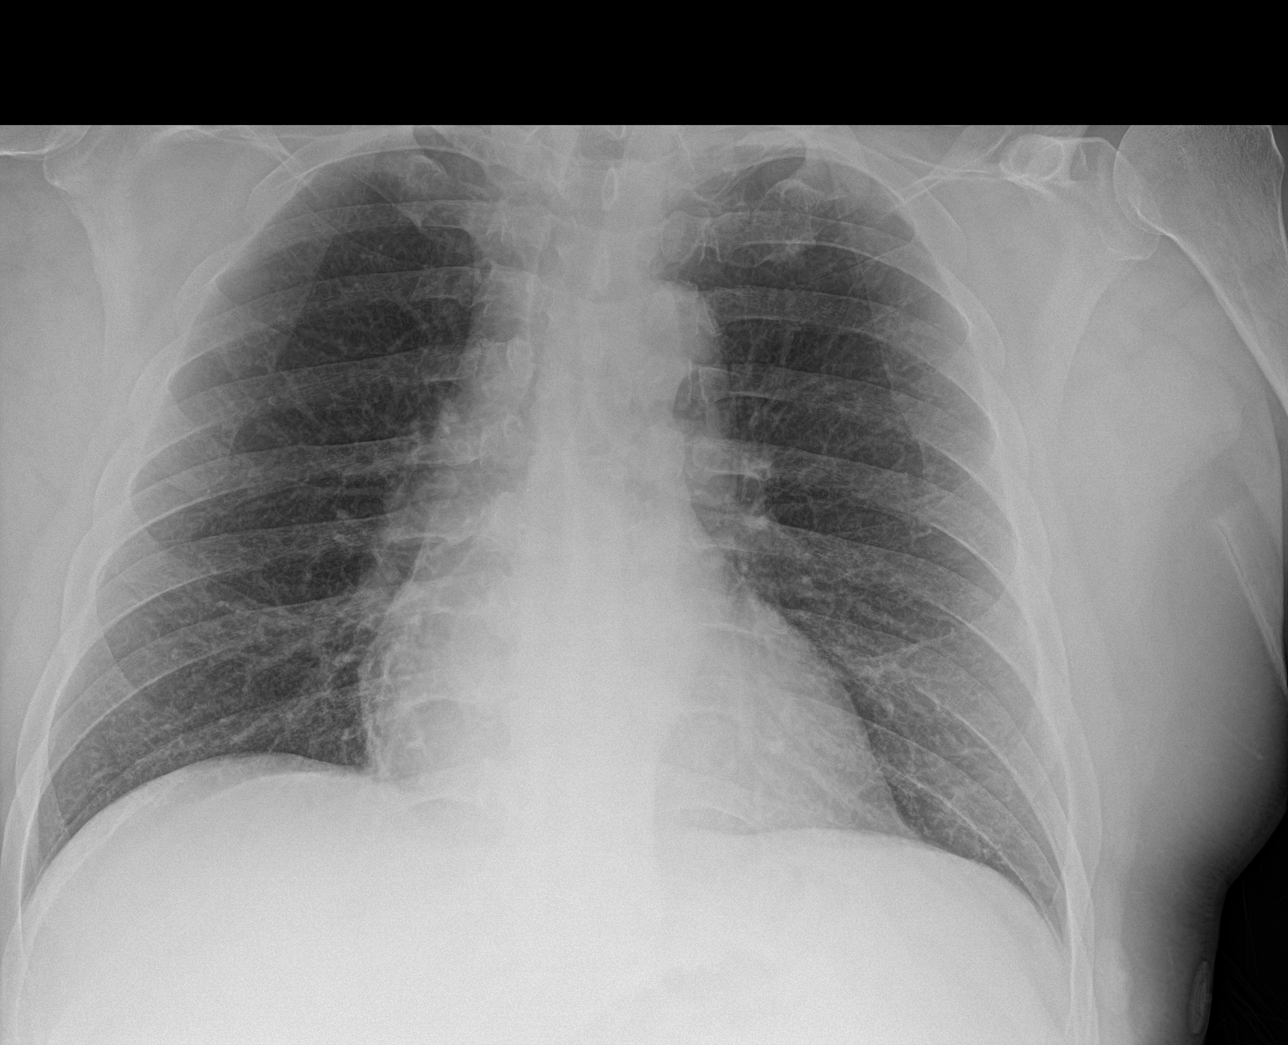

[3 of 3 positions shown; findings below may reference images not displayed]

FINDINGS: The heart size and mediastinal contours are within normal limits.
Both lungs are clear. The visualized skeletal structures are
unremarkable.
IMPRESSION: No active cardiopulmonary disease.

## 2017-11-01 IMAGING — CT CT CTA ABD/PEL W/CM AND/OR W/O CM
2 of 6 series · 14 of 46 positions shown, 16 images · IV contrast (APPLIED)
Comparison: CTA OF THE CHEST July 08, 2015

CLINICAL DATA: Witnessed seizure today. Tremor in right arm.
Alcohol abuse. Shortness of breath and tachycardia. History of
Barrett's esophagus.

EXAM:
CT ANGIOGRAPHY CHEST, ABDOMEN AND PELVIS. CT OF THE CHEST WITHOUT
CONTRAST
TECHNIQUE: Multidetector CT imaging through the chest, abdomen and pelvis was
performed using the standard protocol during bolus administration of
intravenous contrast. Multiplanar reconstructed images and MIPs were
obtained and reviewed to evaluate the vascular anatomy.
CONTRAST:  100 ML OF ISOVUE 370

[Series 2: axial arterial · axial · arterial · 0.82mm/px · z∈[-653,-86]mm · 11 of 225 slices shown, 13 images]
[im 18/225  soft-tissue]
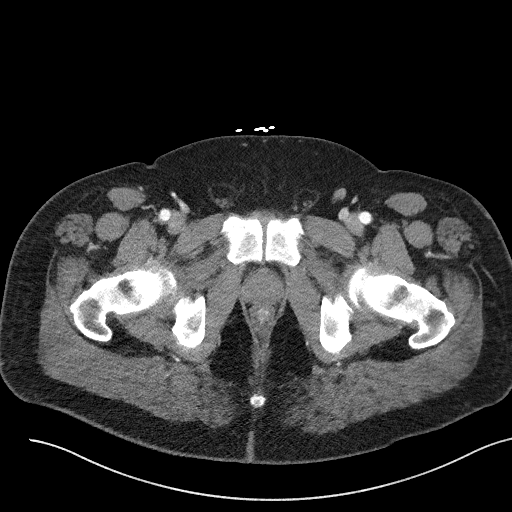
[im 18/225  bone]
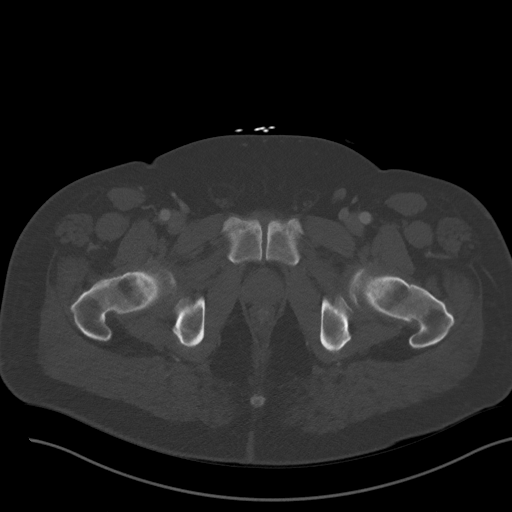
[im 36/225  soft-tissue]
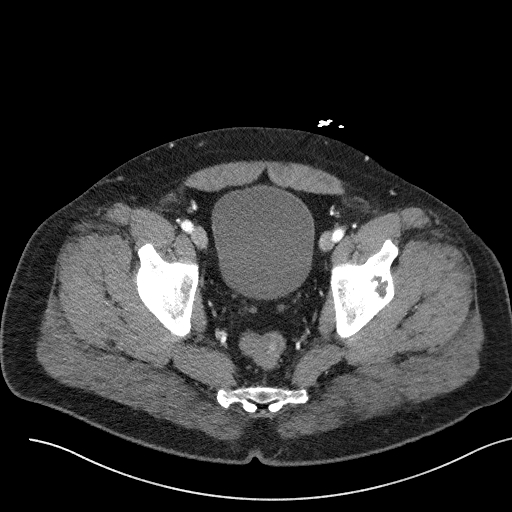
[im 54/225  soft-tissue]
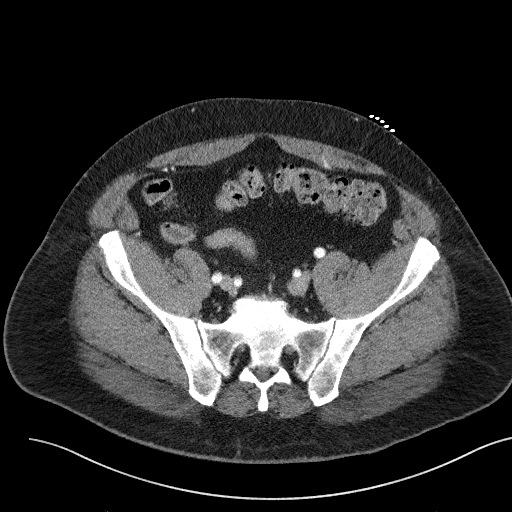
[im 72/225  soft-tissue]
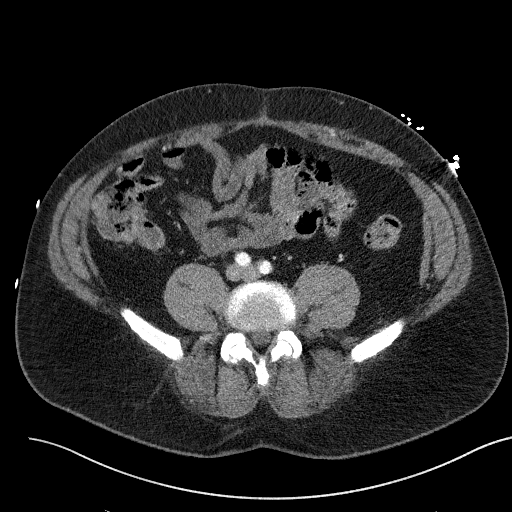
[im 90/225  soft-tissue]
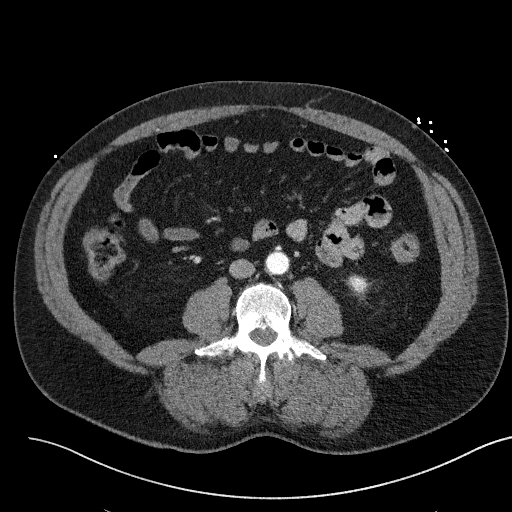
[im 117/225  soft-tissue]
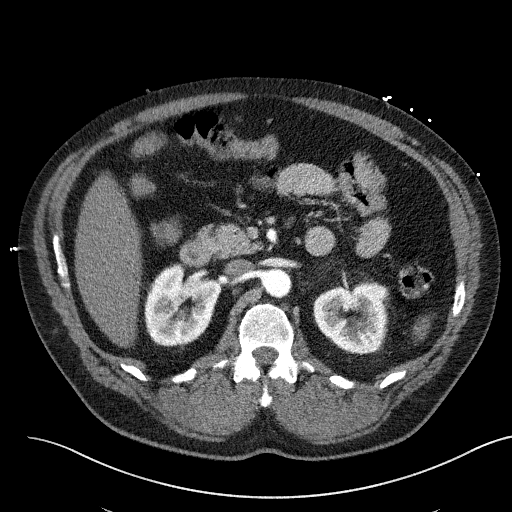
[im 135/225  soft-tissue]
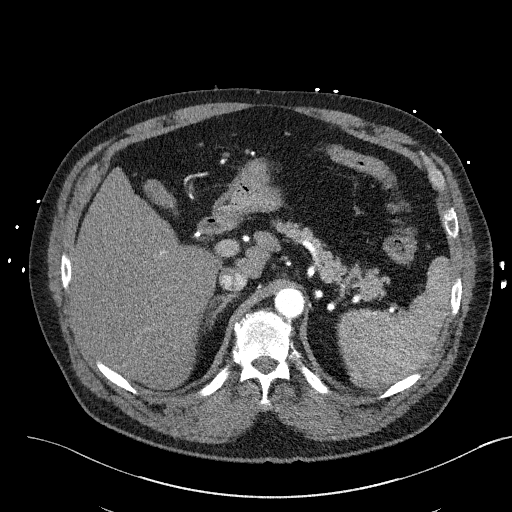
[im 153/225  soft-tissue]
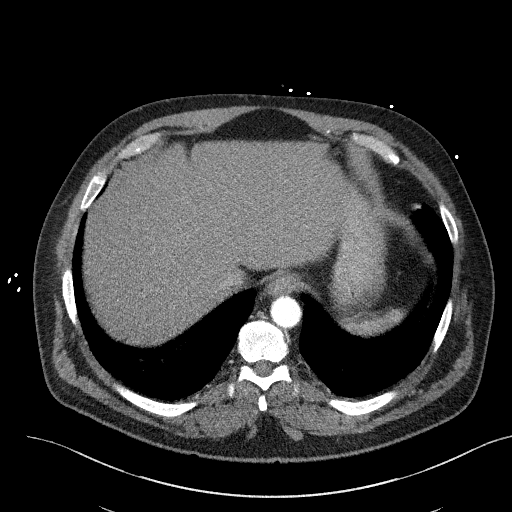
[im 171/225  soft-tissue]
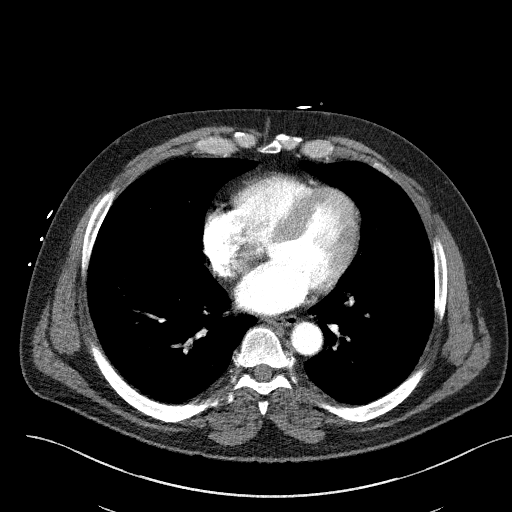
[im 171/225  bone]
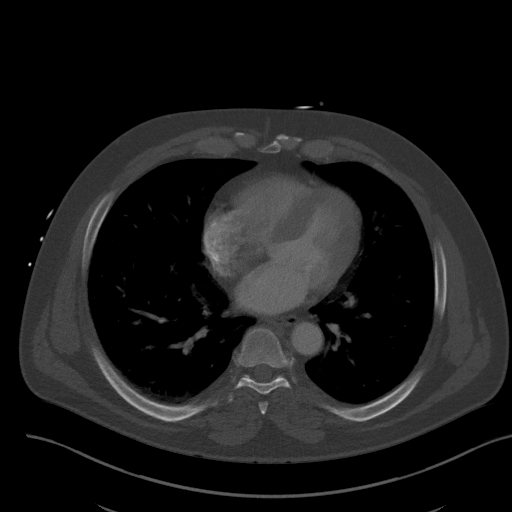
[im 189/225  soft-tissue]
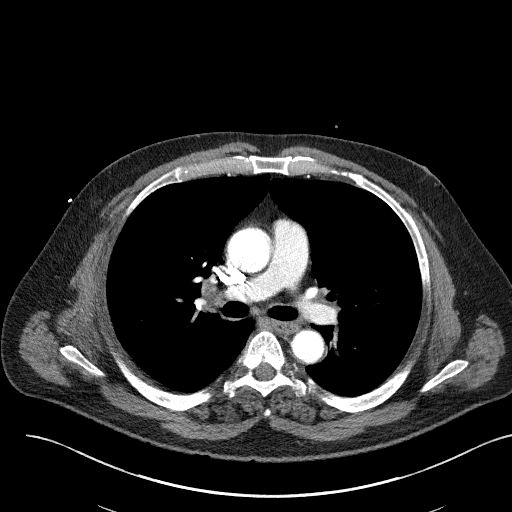
[im 207/225  soft-tissue]
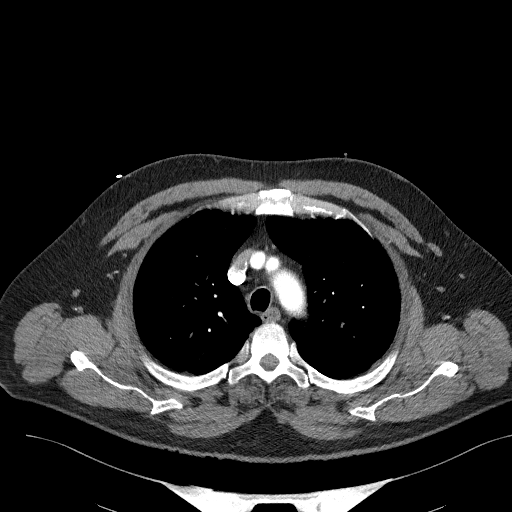

[Series 4: coronals · coronal · 0.85mm/px · 3 of 152 slices shown]
[im 38/152  soft-tissue]
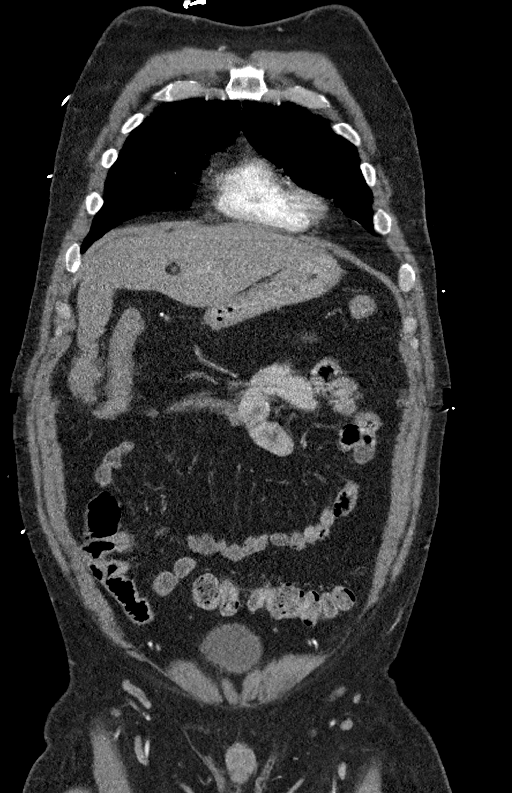
[im 76/152  soft-tissue]
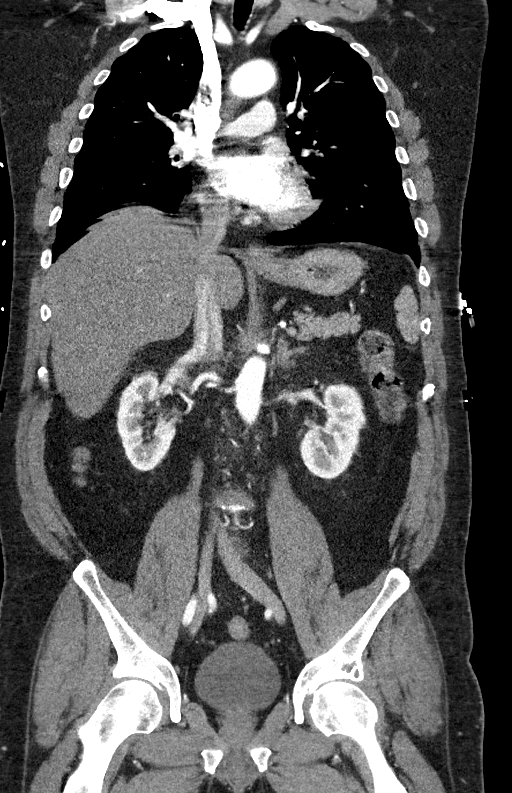
[im 114/152  soft-tissue]
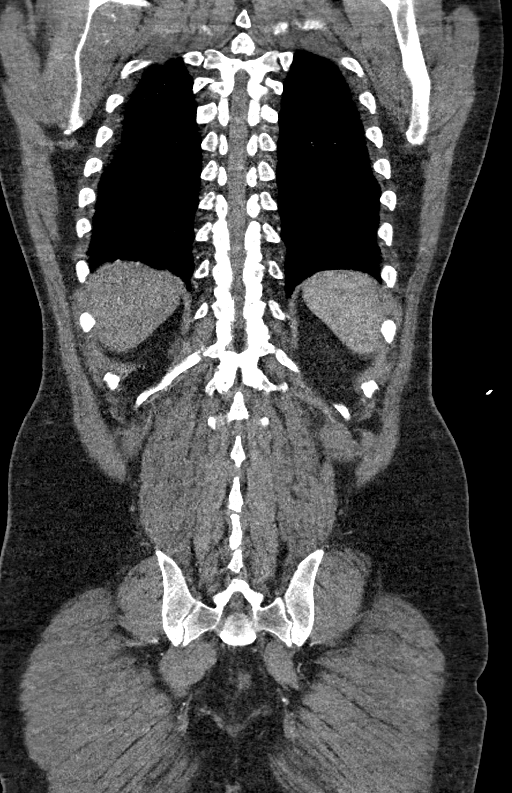

[14 of 46 positions shown; findings below may reference images not displayed]

FINDINGS: CTA CHEST FINDINGS

Cardiovascular: Minimal atherosclerotic change in the aortic arch.
No aneurysm or dissection. The heart and pulmonary arteries are
unremarkable

Mediastinum/Nodes: No enlarged mediastinal, hilar, or axillary lymph
nodes. Thyroid gland, trachea, and esophagus demonstrate no
significant findings.

Lungs/Pleura: Lungs are clear. No pleural effusion or pneumothorax.

Musculoskeletal: No chest wall abnormality. No acute or significant
osseous findings.

Review of the MIP images confirms the above findings.

CTA ABDOMEN AND PELVIS FINDINGS

VASCULAR

Aorta: Normal caliber aorta without aneurysm, dissection, vasculitis
or significant stenosis.

Celiac: Patent without evidence of aneurysm, dissection, vasculitis
or significant stenosis.

SMA: Patent without evidence of aneurysm, dissection, vasculitis or
significant stenosis.

Renals: There are 2 renal arteries to both the right and left
kidneys without stenosis or aneurysm.

IMA: Patent without evidence of aneurysm, dissection, vasculitis or
significant stenosis.

Inflow: Patent without evidence of aneurysm, dissection, vasculitis
or significant stenosis.

Veins: Evaluation of the veins is limited due to timing of contrast
but no abnormality is seen.

Review of the MIP images confirms the above findings.

NON-VASCULAR

Hepatobiliary: Previous cholecystectomy. Hepatic steatosis. No liver
masses. The portal veins are not well assessed.

Pancreas: Unremarkable. No pancreatic ductal dilatation or
surrounding inflammatory changes.

Spleen: Normal in size without focal abnormality.

Adrenals/Urinary Tract: Adrenal glands are unremarkable. Kidneys are
normal, without renal calculi, focal lesion, or hydronephrosis.
Bladder is unremarkable.

Stomach/Bowel: The appendix is not visualized. The stomach, small
bowel, and colon are normal.

Lymphatic: No adenopathy.

Reproductive: Prostate is unremarkable.

Other: No abdominal wall hernia or abnormality. No abdominopelvic
ascites.

Musculoskeletal: No acute or significant osseous findings.

Review of the MIP images confirms the above findings.
IMPRESSION: 1. No aneurysm or dissection seen in the thoracic or abdominal
aorta. The branching vessels are unremarkable.
2. No acute abnormalities.

## 2017-11-01 IMAGING — CT CT HEAD W/O CM
3 series · 15 of 47 positions shown, 18 images · non-contrast
Comparison: None.

CLINICAL DATA: Witnessed seizure on front porch today, tremor right
arm, etoh abuse, SOB, tachycardiac, hx of barrett's esophageus

EXAM:
CT HEAD WITHOUT CONTRAST
TECHNIQUE: Contiguous axial images were obtained from the base of the skull
through the vertex without intravenous contrast.

[Series 5: head wo2 · axial · 0.47mm/px · z∈[-141,-16]mm · 9 of 31 slices shown, 12 images]
[im 3/31  brain]
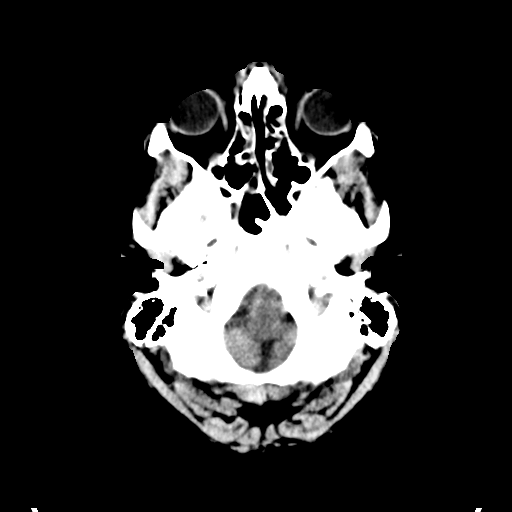
[im 3/31  bone]
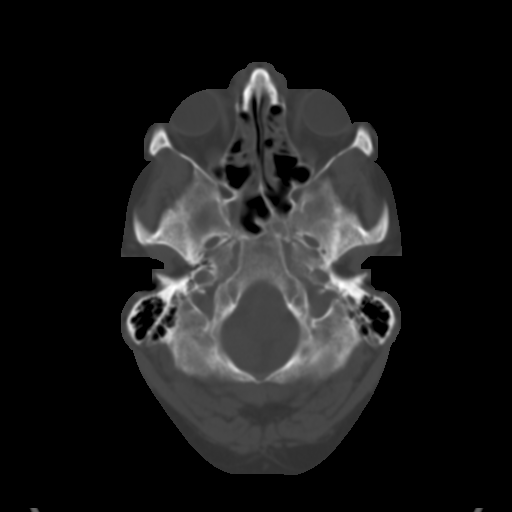
[im 6/31  brain]
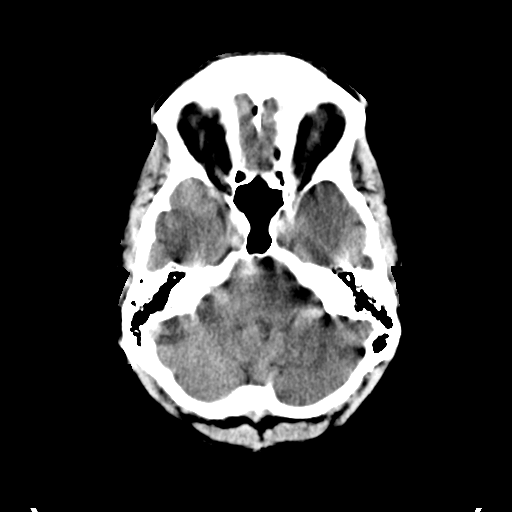
[im 9/31  brain]
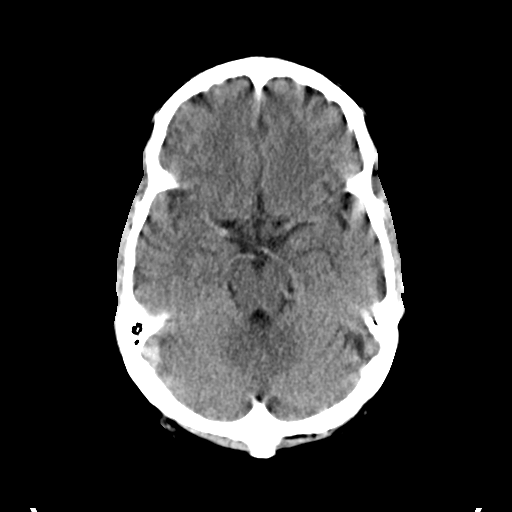
[im 12/31  brain]
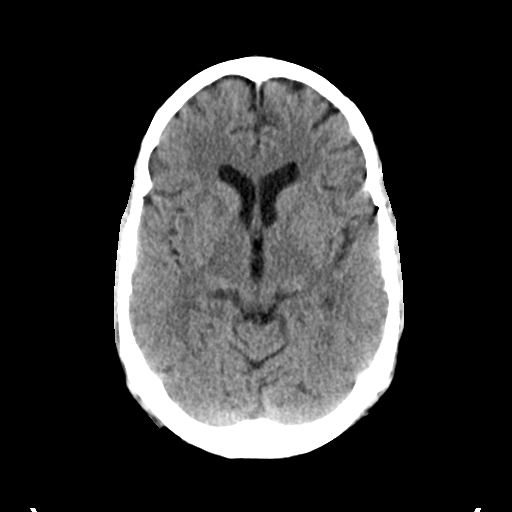
[im 16/31  brain]
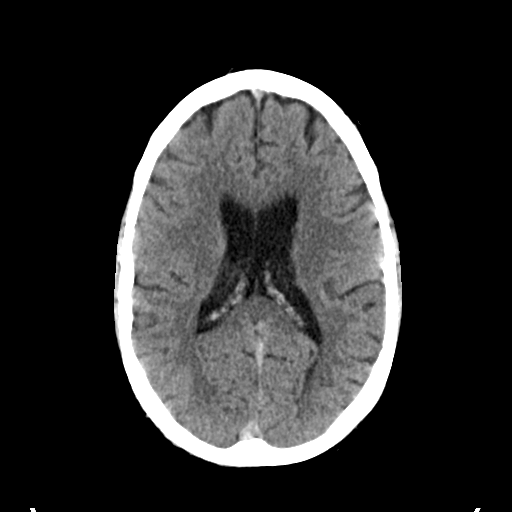
[im 16/31  bone]
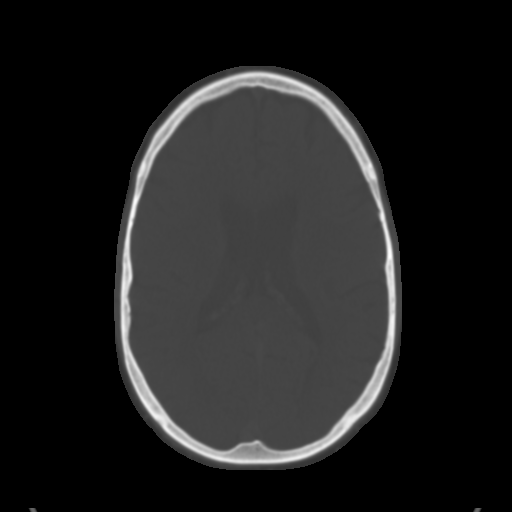
[im 19/31  brain]
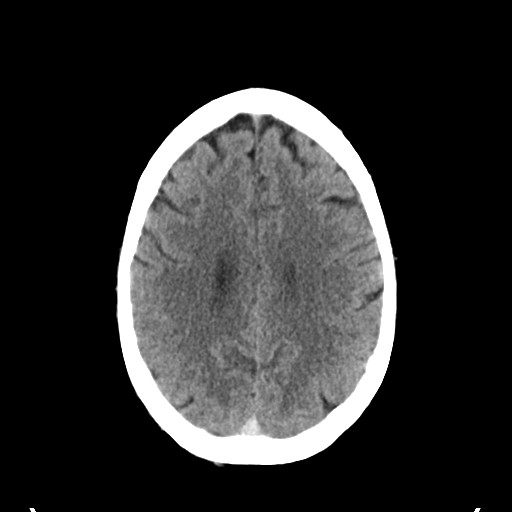
[im 22/31  brain]
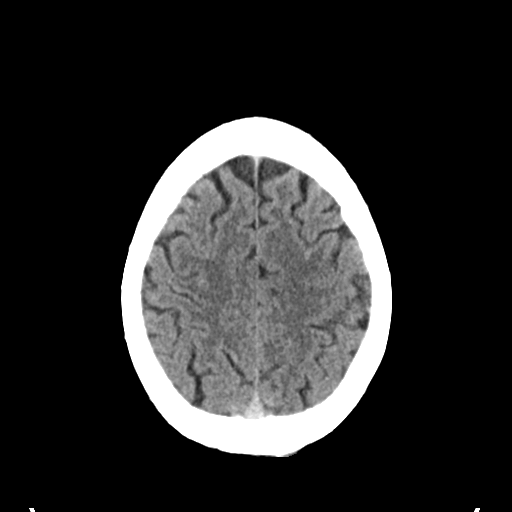
[im 25/31  brain]
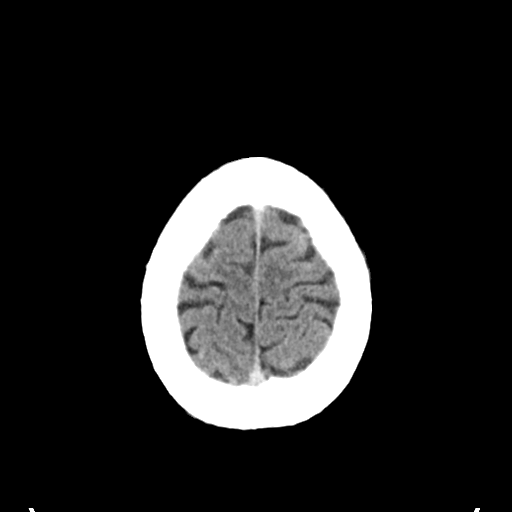
[im 28/31  brain]
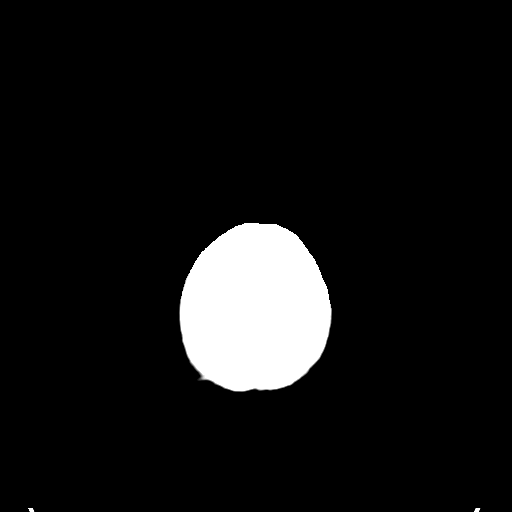
[im 28/31  bone]
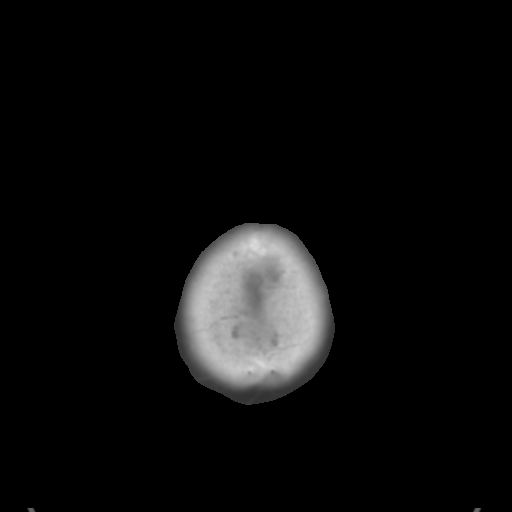

[Series 8: coronal soft (id) · coronal · 0.28mm/px · 3 of 67 slices shown]
[im 23/67  brain]
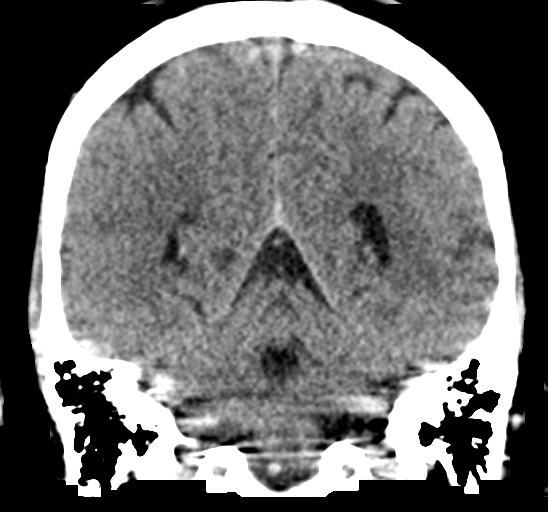
[im 30/67  brain]
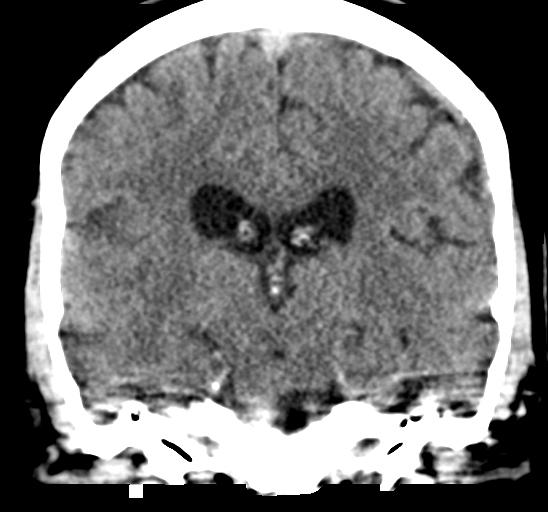
[im 37/67  brain]
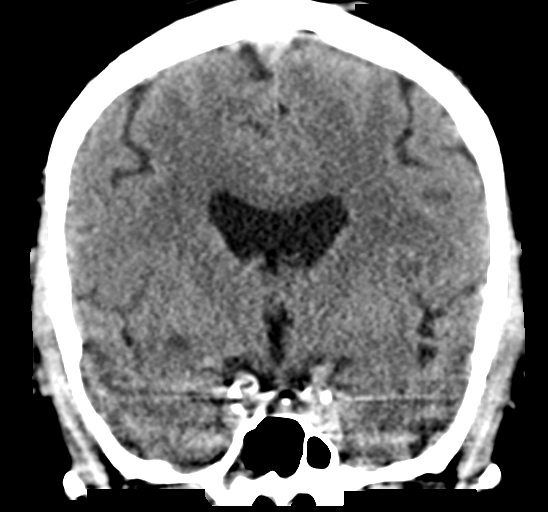

[Series 9: sagittal soft (id) · sagittal · 0.29mm/px · 3 of 51 slices shown]
[im 17/51  brain]
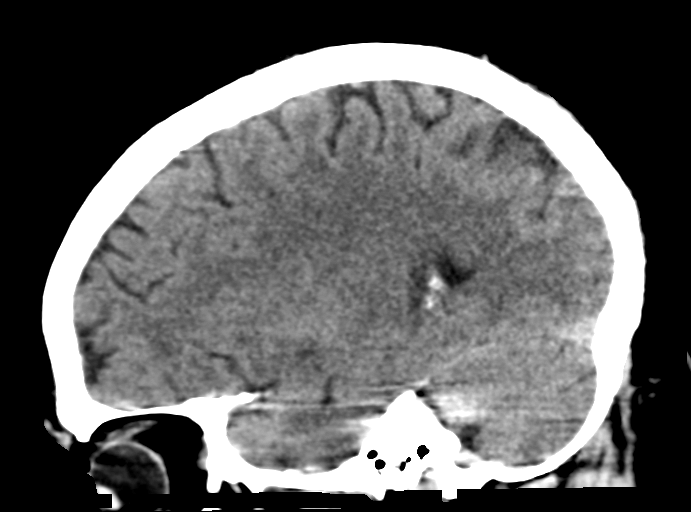
[im 26/51  brain]
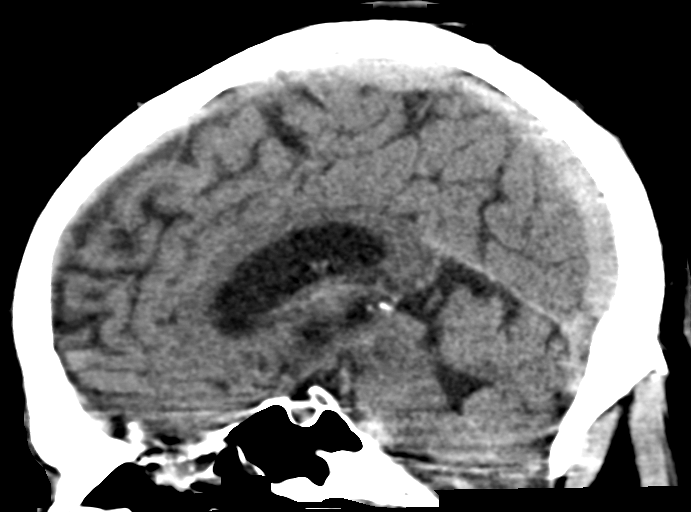
[im 34/51  brain]
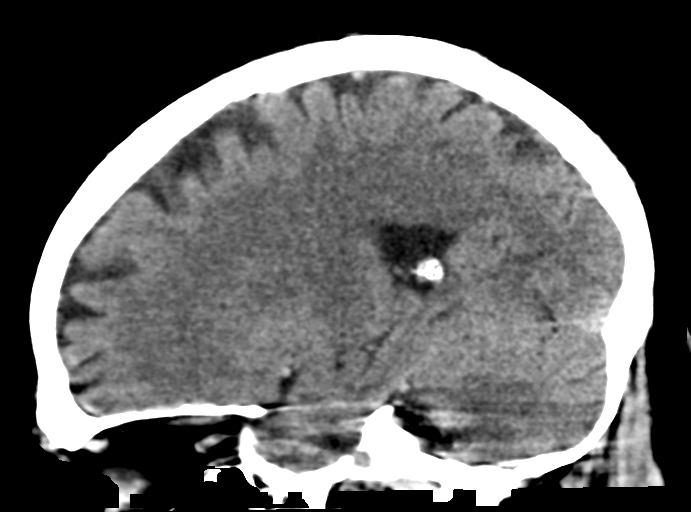

[15 of 47 positions shown; findings below may reference images not displayed]

FINDINGS: Brain: No evidence of acute infarction, hemorrhage, hydrocephalus,
extra-axial collection or mass lesion/mass effect.

There is ventricular enlargement reflecting volume loss advanced for
age.

Vascular: No hyperdense vessel or unexpected calcification.

Skull: Normal. Negative for fracture or focal lesion.

Sinuses/Orbits: Visualized globes and orbits are unremarkable. Mild
ethmoid sinus mucosal thickening. Remaining visualized sinuses and
the mastoid air cells are clear.

Other: None
IMPRESSION: 1. No acute intracranial abnormalities.
2. Mild atrophy, advanced for age.

## 2017-11-01 IMAGING — DX DG CHEST 1V PORT
1 series · 1 of 1 positions shown · non-contrast
Comparison: 10/05/2016

CLINICAL DATA: Pt brought into ER by EMS for possible seizure. Pt
was found by neighbor's children on his front porch. Pt has right
arm tremor and is unable to form complete sentences. SOB and
tachypnea per nursing notes. Pt admits to alcohol and cocaine
consumption today. Pt describing epigastric pain. Hx - barrett's
esophagus, esophogeal cancer, CAD, HTN, stroke 8571. Current smoker
1 ppd.

EXAM:
PORTABLE CHEST 1 VIEW

[chest ap]
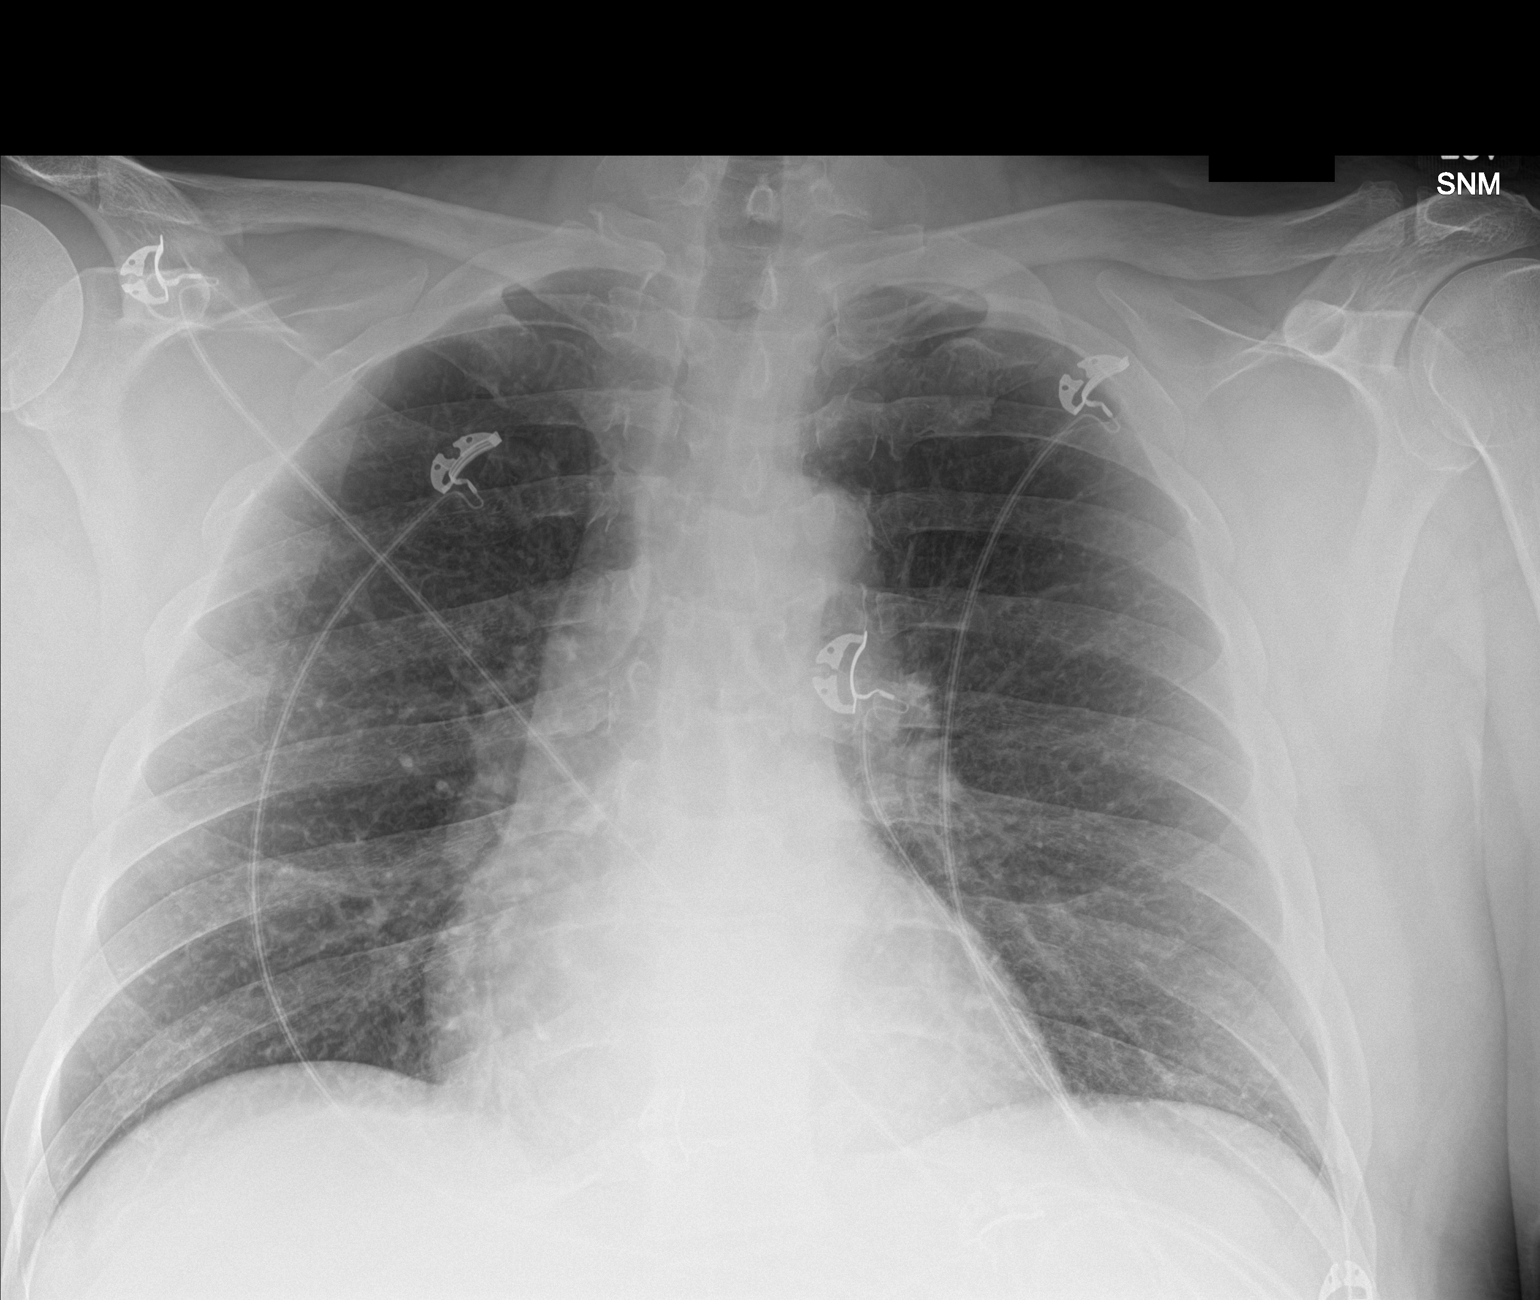

[1 of 1 positions shown; findings below may reference images not displayed]

FINDINGS: Cardiac silhouette is normal in size. No mediastinal or hilar
masses. No convincing adenopathy.

Mildly prominent bronchovascular markings are noted bilaterally,
stable. Lungs are otherwise clear.

No pleural effusion.  No pneumothorax.

Skeletal structures are intact.
IMPRESSION: No active disease.

## 2017-11-03 ENCOUNTER — Encounter: Payer: Self-pay | Admitting: Emergency Medicine

## 2017-11-03 ENCOUNTER — Other Ambulatory Visit: Payer: Self-pay

## 2017-11-03 ENCOUNTER — Emergency Department
Admission: EM | Admit: 2017-11-03 | Discharge: 2017-11-05 | Disposition: A | Discharge status: Other Acute Inpatient Hospital | Payer: Medicaid Other | Attending: Emergency Medicine | Admitting: Emergency Medicine

## 2017-11-03 DIAGNOSIS — F10920 Alcohol use, unspecified with intoxication, uncomplicated: Secondary | ICD-10-CM | POA: Insufficient documentation

## 2017-11-03 DIAGNOSIS — F17210 Nicotine dependence, cigarettes, uncomplicated: Secondary | ICD-10-CM | POA: Diagnosis not present

## 2017-11-03 DIAGNOSIS — E785 Hyperlipidemia, unspecified: Secondary | ICD-10-CM | POA: Diagnosis present

## 2017-11-03 DIAGNOSIS — I10 Essential (primary) hypertension: Secondary | ICD-10-CM | POA: Diagnosis present

## 2017-11-03 DIAGNOSIS — F319 Bipolar disorder, unspecified: Secondary | ICD-10-CM | POA: Diagnosis not present

## 2017-11-03 DIAGNOSIS — R45851 Suicidal ideations: Secondary | ICD-10-CM

## 2017-11-03 DIAGNOSIS — Z7982 Long term (current) use of aspirin: Secondary | ICD-10-CM | POA: Insufficient documentation

## 2017-11-03 DIAGNOSIS — I2510 Atherosclerotic heart disease of native coronary artery without angina pectoris: Secondary | ICD-10-CM | POA: Insufficient documentation

## 2017-11-03 DIAGNOSIS — F1020 Alcohol dependence, uncomplicated: Secondary | ICD-10-CM | POA: Diagnosis present

## 2017-11-03 DIAGNOSIS — F1420 Cocaine dependence, uncomplicated: Secondary | ICD-10-CM | POA: Diagnosis present

## 2017-11-03 DIAGNOSIS — F329 Major depressive disorder, single episode, unspecified: Secondary | ICD-10-CM | POA: Diagnosis present

## 2017-11-03 LAB — CBC
HEMATOCRIT: 54.3 % — AB (ref 40.0–52.0)
Hemoglobin: 17.9 g/dL (ref 13.0–18.0)
MCH: 29.7 pg (ref 26.0–34.0)
MCHC: 32.9 g/dL (ref 32.0–36.0)
MCV: 90.4 fL (ref 80.0–100.0)
Platelets: 206 10*3/uL (ref 150–440)
RBC: 6 MIL/uL — AB (ref 4.40–5.90)
RDW: 13.1 % (ref 11.5–14.5)
WBC: 12.9 10*3/uL — AB (ref 3.8–10.6)

## 2017-11-03 LAB — COMPREHENSIVE METABOLIC PANEL
ALBUMIN: 3.9 g/dL (ref 3.5–5.0)
ALT: 70 U/L — AB (ref 17–63)
AST: 49 U/L — AB (ref 15–41)
Alkaline Phosphatase: 61 U/L (ref 38–126)
Anion gap: 12 (ref 5–15)
BILIRUBIN TOTAL: 0.7 mg/dL (ref 0.3–1.2)
BUN: 10 mg/dL (ref 6–20)
CHLORIDE: 103 mmol/L (ref 101–111)
CO2: 21 mmol/L — ABNORMAL LOW (ref 22–32)
Calcium: 9.1 mg/dL (ref 8.9–10.3)
Creatinine, Ser: 0.79 mg/dL (ref 0.61–1.24)
GFR calc Af Amer: >60 mL/min (ref >60)
GFR calc non Af Amer: >60 mL/min (ref >60)
GLUCOSE: 98 mg/dL (ref 65–99)
POTASSIUM: 4 mmol/L (ref 3.5–5.1)
Sodium: 136 mmol/L (ref 135–145)
TOTAL PROTEIN: 7.5 g/dL (ref 6.5–8.1)

## 2017-11-03 LAB — URINE DRUG SCREEN, QUALITATIVE (ARMC ONLY)
AMPHETAMINES, UR SCREEN: NOT DETECTED
Barbiturates, Ur Screen: NOT DETECTED
Benzodiazepine, Ur Scrn: NOT DETECTED
Cannabinoid 50 Ng, Ur ~~LOC~~: NOT DETECTED
Cocaine Metabolite,Ur ~~LOC~~: POSITIVE — AB
MDMA (ECSTASY) UR SCREEN: NOT DETECTED
Methadone Scn, Ur: NOT DETECTED
OPIATE, UR SCREEN: NOT DETECTED
PHENCYCLIDINE (PCP) UR S: NOT DETECTED
Tricyclic, Ur Screen: NOT DETECTED

## 2017-11-03 LAB — ETHANOL: Alcohol, Ethyl (B): 268 mg/dL — ABNORMAL HIGH (ref <10)

## 2017-11-03 LAB — ACETAMINOPHEN LEVEL: Acetaminophen (Tylenol), Serum: <10 ug/mL — ABNORMAL LOW (ref 10–30)

## 2017-11-03 LAB — SALICYLATE LEVEL: Salicylate Lvl: <7 mg/dL (ref 2.8–30.0)

## 2017-11-03 MED ORDER — QUETIAPINE FUMARATE 200 MG PO TABS
400.0000 mg | ORAL_TABLET | Freq: Every day | ORAL | Status: DC
  Administered 2017-11-03 – 2017-11-04 (×2): 400 mg via ORAL
  Filled 2017-11-03: qty 2

## 2017-11-03 MED ORDER — THIAMINE HCL 100 MG/ML IJ SOLN: 100.0000 mg | Freq: Every day | INTRAMUSCULAR | Status: DC

## 2017-11-03 MED ORDER — QUETIAPINE FUMARATE 200 MG PO TABS
ORAL_TABLET | ORAL | Status: AC
  Filled 2017-11-03: qty 2

## 2017-11-03 MED ORDER — LORAZEPAM 2 MG PO TABS
0.0000 mg | ORAL_TABLET | Freq: Four times a day (QID) | ORAL | Status: DC
  Administered 2017-11-03 – 2017-11-04 (×2): 2 mg via ORAL
  Administered 2017-11-04 – 2017-11-05 (×3): 1 mg via ORAL
  Filled 2017-11-03 (×3): qty 1

## 2017-11-03 MED ORDER — LORAZEPAM 2 MG/ML IJ SOLN: 0.0000 mg | Freq: Four times a day (QID) | INTRAMUSCULAR | Status: DC

## 2017-11-03 MED ORDER — LORAZEPAM 2 MG PO TABS
2.0000 mg | ORAL_TABLET | Freq: Once | ORAL | Status: AC
  Administered 2017-11-03: 2 mg via ORAL

## 2017-11-03 MED ORDER — VITAMIN B-1 100 MG PO TABS
100.0000 mg | ORAL_TABLET | Freq: Every day | ORAL | Status: DC
  Administered 2017-11-03 – 2017-11-05 (×3): 100 mg via ORAL
  Filled 2017-11-03 (×3): qty 1

## 2017-11-03 MED ORDER — LORAZEPAM 2 MG/ML IJ SOLN: 0.0000 mg | Freq: Two times a day (BID) | INTRAMUSCULAR | Status: DC

## 2017-11-03 MED ORDER — LORAZEPAM 2 MG PO TABS: 0.0000 mg | ORAL_TABLET | Freq: Two times a day (BID) | ORAL | Status: DC

## 2017-11-03 NOTE — ED Notes (Signed)
ED Provider at bedside. 

## 2017-11-03 NOTE — ED Provider Notes (Addendum)
[facility] Emergency Department Provider Note  ____________________________________________   First MD Initiated Contact with Patient 11/03/17 1904     (approximate)  I have reviewed the triage vital signs and the nursing notes.   HISTORY  Chief Complaint Mental Health Problem   HPI Ian [name] is a 56 y.o. male with a history of GERD, TIA as well as bipolar disorder and polysubstance abuse who is presenting to the emergency department today with alcohol intoxication as well as suicidal ideation.  He is also complaining of 3 weeks of left upper extremity pain but without injury.  He attributes this to carpal tunnel syndrome.  Denies any chest pain or shortness of breath.  Denies any weakness.  Says that he has a plan to kill himself by jumping off a bridge.  Past Medical History:  Diagnosis Date  . Anxiety   . Barrett esophagus   . Cancer ([place])   . Coronary artery disease   . Depression   . GERD (gastroesophageal reflux disease)   . Hypertension   . Stroke [facility]) 2014   "mini-stroke" per patient    Patient Active Problem List   Diagnosis Date Noted  . Dyslipidemia 01/07/2017  . Barrett's esophagus 01/07/2017  . Bipolar I disorder, most recent episode depressed with anxious distress ([place]) 01/06/2017  . Cannabis use disorder, moderate, dependence ([place]) 01/06/2017  . Tobacco use disorder 01/06/2017  . Alcohol use disorder, moderate, dependence ([place]) 10/06/2016  . Substance induced mood disorder ([place]) 10/06/2016  . Cocaine use disorder, moderate, dependence ([place]) 10/06/2016  . Depression 10/06/2016  . Acute left-sided weakness 05/03/2015  . CVA (cerebral infarction) 05/03/2015  . Weakness 11/16/2013  . Chest pain 11/16/2013  . HTN (hypertension) 11/16/2013  . Left-sided weakness 11/16/2013  . Torsades de pointes ([place]) 11/16/2013  . Hemiplegia, unspecified, affecting nondominant side 11/15/2013  . Speech and language deficits 11/15/2013     Past Surgical History:  Procedure Laterality Date  . ANGIOPLASTY    . APPENDECTOMY    . CARDIAC CATHETERIZATION    . CHOLECYSTECTOMY    . WRIST SURGERY Right    age 31    Prior to Admission medications   Medication Sig Start Date End Date Taking? Authorizing Provider  aspirin 81 MG EC tablet Take 1 tablet (81 mg total) by mouth daily. 01/13/17   Pucilowska, Herma Ard B, MD  atorvastatin (LIPITOR) 40 MG tablet Take 1 tablet (40 mg total) by mouth daily at 6 PM. 01/13/17 02/12/17  Pucilowska, Jolanta B, MD  buPROPion (WELLBUTRIN XL) 150 MG 24 hr tablet Take 1 tablet (150 mg total) by mouth daily. 01/13/17   Pucilowska, Wardell Honour, MD  calcium carbonate (TUMS) 500 MG chewable tablet Chew 2 tablets (400 mg of elemental calcium total) by mouth 3 (three) times daily as needed for indigestion or heartburn. 01/04/17 01/04/18  Carrie Mew, MD  lisinopril (PRINIVIL,ZESTRIL) 10 MG tablet Take 1 tablet (10 mg total) by mouth daily. 01/13/17   Pucilowska, Herma Ard B, MD  Melatonin 5 MG TABS Take 2 tablets (10 mg total) by mouth at bedtime. 01/13/17   Pucilowska, Herma Ard B, MD  QUEtiapine (SEROQUEL XR) 300 MG 24 hr tablet Take 1 tablet (300 mg total) by mouth at bedtime. 01/13/17   Pucilowska, Herma Ard B, MD  QUEtiapine (SEROQUEL) 50 MG tablet Take 1 tablet (50 mg total) by mouth 2 (two) times daily. 01/13/17   Pucilowska, Wardell Honour, MD    Allergies Fluoxetine and Hydrocodone-acetaminophen  Family History  Problem Relation  Age of Onset  . Stroke Father   . Leukemia Father   . Breast cancer Sister     Social History Social History   Tobacco Use  . Smoking status: Current Every Day Smoker    Packs/day: 1.00    Types: Cigarettes  . Smokeless tobacco: Never Used  Substance Use Topics  . Alcohol use: Yes    Comment: denies being a daily drinker but did have alcohol today  . Drug use: Yes    Types: Cocaine, Marijuana    Comment: used cocaine today; denies smoking marijuana for years    Review  of Systems  Constitutional: No fever/chills Eyes: No visual changes. ENT: No sore throat. Cardiovascular: Denies chest pain. Respiratory: Denies shortness of breath. Gastrointestinal: No abdominal pain.  No nausea, no vomiting.  No diarrhea.  No constipation. Genitourinary: Negative for dysuria. Musculoskeletal: Negative for back pain. Skin: Negative for rash. Neurological: Negative for headaches, focal weakness or numbness.   ____________________________________________   PHYSICAL EXAM:  VITAL SIGNS: ED Triage Vitals  Enc Vitals Group     BP 11/03/17 1838 (!) 141/106     Pulse Rate 11/03/17 1838 (!) 103     Resp 11/03/17 1838 16     Temp 11/03/17 1838 98.8 F (37.1 C)     Temp Source 11/03/17 1838 Oral     SpO2 11/03/17 1838 96 %     Weight 11/03/17 1835 260 lb (117.9 kg)     Height 11/03/17 1835 6' (1.829 m)     Head Circumference --      Peak Flow --      Pain Score --      Pain Loc --      Pain Edu? --      Excl. in [place]? --     Constitutional: Alert and oriented.  ,  Patient is disheveled with pressured speech.  Dirt on his bilateral hands. Eyes: Conjunctivae are normal.  Head: Atraumatic. Nose: No congestion/rhinnorhea. Mouth/Throat: Mucous membranes are moist.  Neck: No stridor.   Cardiovascular: Normal rate, regular rhythm. Grossly normal heart sounds.   Respiratory: Normal respiratory effort.  No retractions. Lungs CTAB. Gastrointestinal: Soft and nontender. No distention.  Musculoskeletal: No lower extremity tenderness nor edema.  No joint effusions. 5 out of 5 strength bilateral upper extremities without any swelling.  No erythema.  Full active range of motion throughout all extremities.  Neurologic:  Normal speech and language. No gross focal neurologic deficits are appreciated. Skin:  Skin is warm, dry and intact. No rash noted. Psychiatric: Pressured speech.  ____________________________________________   LABS (all labs ordered are listed, but  only abnormal results are displayed)  Labs Reviewed  CBC - Abnormal; Notable for the following components:      Result Value   WBC 12.9 (*)    RBC 6.00 (*)    HCT 54.3 (*)    All other components within normal limits  COMPREHENSIVE METABOLIC PANEL  ETHANOL  SALICYLATE LEVEL  ACETAMINOPHEN LEVEL  URINE DRUG SCREEN, QUALITATIVE ([facility] ONLY)   ____________________________________________  EKG  ED ECG REPORT I, Doran Stabler, the attending physician, personally viewed and interpreted this ECG.   Date: 11/03/2017  EKG Time: 1850  Rate: 101  Rhythm: sinus tachycardia  Axis: Normal  Intervals:none  ST&T Change: No ST segment elevation or depression.  No abnormal T wave inversion.  ____________________________________________  RADIOLOGY   ____________________________________________   PROCEDURES  Procedure(s) performed:   Procedures  Critical Care performed:  ____________________________________________   INITIAL IMPRESSION / ASSESSMENT AND PLAN / ED COURSE  Pertinent labs & imaging results that were available during my care of the patient were reviewed by me and considered in my medical decision making (see chart for details).  DDX: Alcohol intoxication, manic episode, suicidal ideation  As part of my medical decision making, I reviewed the following data within the [place] Notes from prior ED visits  Patient to be placed on IVC for suicidal ideation.  We will also place him on theCIWA protocol.  Patient understanding of the plan willing to comply.        ____________________________________________   FINAL CLINICAL IMPRESSION(S) / ED DIAGNOSES  Alcohol intoxication.  Suicidal ideation.    NEW MEDICATIONS STARTED DURING THIS VISIT:  This SmartLink is deprecated. Use AVSMEDLIST instead to display the medication list for a patient.   Note:  This document was prepared using Dragon voice recognition software and may include  unintentional dictation errors.     Orbie Pyo, MD 11/03/17 Dorthula Perfect    Orbie Pyo, MD 11/03/17 [phone]

## 2017-11-03 NOTE — ED Notes (Signed)
Pt becoming increasingly anxious and upset. This EDT gave pt a beverage. Pt states "i feel like Im about to loose it". Pt visibly shaking and turning red, with increased breathing. RN has been notified

## 2017-11-03 NOTE — ED Notes (Signed)
Patient anxious, however he is cooperative.  EKG in progress.

## 2017-11-03 NOTE — ED Notes (Signed)
Patient clothing placed in patient belonging bags.  Patient wallet that had some cards in the wallet.  No money seen.  Reading glasses placed in white belonging bag.  Patient arrives with a black garbage bag full of clothing.  Bag labeled.  Belongings witnessed by Olivia Mackie, EMT-P

## 2017-11-03 NOTE — ED Notes (Signed)
Patient agitated and verbally aggressive upon admission to unit.  Patient states, "I was here before for 4 days and it's bringing back memories.  They treated me like a child.  They laughed at the patients.  One person was beating on the glass and crying and no one cared.  I'm not staying back here.  I am going to go off!"  Patient began pushing chairs around in the dayroom.  Security and Probation officer were able to verbally deescalate patient.  Patient receptive.

## 2017-11-03 NOTE — ED Notes (Signed)
Report given to SOC 

## 2017-11-03 NOTE — ED Notes (Signed)
Pt was given orange juice x3 since he did not want a sandwich tray. Pt  Calm and cooperative at this time watching tv

## 2017-11-03 NOTE — ED Notes (Signed)
EKG was completed by this EDT.

## 2017-11-03 NOTE — ED Notes (Signed)
Pharmacy tech in with pt 

## 2017-11-03 NOTE — ED Notes (Signed)
Pt has calmed down a little. Pt is more agreeable now being in a room and out of the hall. Pt is apologetic to this RN.

## 2017-11-03 NOTE — ED Notes (Signed)
Pt is calm and waiting for [facility]. No aggression noted at this time.

## 2017-11-03 NOTE — ED Triage Notes (Signed)
Arrives voluntarily stating "I don't care anymore".  States he has not had a shower for 2-3 days.  Patient states he lives home by himself but states the hose if full of trash.  Patient states he has been drinking "quite a bit today. And "I've had enough"  Patient also c/o left hand and arm pain x 2-3 days.  Patient states SI.  Denies HI.

## 2017-11-03 NOTE — ED Notes (Signed)
Patient lying in bed watching TV. Calm and cooperative.

## 2017-11-04 DIAGNOSIS — F319 Bipolar disorder, unspecified: Secondary | ICD-10-CM

## 2017-11-04 MED ORDER — ATORVASTATIN CALCIUM 20 MG PO TABS
40.0000 mg | ORAL_TABLET | Freq: Every day | ORAL | Status: DC
  Administered 2017-11-04: 40 mg via ORAL
  Filled 2017-11-04: qty 2

## 2017-11-04 MED ORDER — ACETAMINOPHEN 325 MG PO TABS
650.0000 mg | ORAL_TABLET | Freq: Four times a day (QID) | ORAL | Status: DC | PRN
  Administered 2017-11-04 – 2017-11-05 (×2): 650 mg via ORAL
  Filled 2017-11-04: qty 2

## 2017-11-04 MED ORDER — ACETAMINOPHEN 325 MG PO TABS
ORAL_TABLET | ORAL | Status: AC
  Filled 2017-11-04: qty 2

## 2017-11-04 MED ORDER — IBUPROFEN 800 MG PO TABS
ORAL_TABLET | ORAL | Status: AC
  Filled 2017-11-04: qty 1

## 2017-11-04 MED ORDER — HYDROXYZINE HCL 25 MG PO TABS
50.0000 mg | ORAL_TABLET | Freq: Every evening | ORAL | Status: DC | PRN
  Administered 2017-11-04 – 2017-11-05 (×2): 50 mg via ORAL
  Filled 2017-11-04 (×2): qty 2

## 2017-11-04 MED ORDER — LISINOPRIL 5 MG PO TABS
10.0000 mg | ORAL_TABLET | Freq: Every day | ORAL | Status: DC
  Administered 2017-11-04 – 2017-11-05 (×2): 10 mg via ORAL
  Filled 2017-11-04 (×2): qty 2

## 2017-11-04 MED ORDER — ASPIRIN EC 81 MG PO TBEC
81.0000 mg | DELAYED_RELEASE_TABLET | Freq: Every day | ORAL | Status: DC
  Administered 2017-11-04 – 2017-11-05 (×2): 81 mg via ORAL
  Filled 2017-11-04 (×2): qty 1

## 2017-11-04 MED ORDER — IBUPROFEN 800 MG PO TABS
800.0000 mg | ORAL_TABLET | Freq: Once | ORAL | Status: AC
  Administered 2017-11-04: 800 mg via ORAL

## 2017-11-04 MED ORDER — LORAZEPAM 1 MG PO TABS
1.0000 mg | ORAL_TABLET | Freq: Once | ORAL | Status: AC
  Administered 2017-11-04: 1 mg via ORAL
  Filled 2017-11-04: qty 1

## 2017-11-04 MED ORDER — LORAZEPAM 2 MG PO TABS
ORAL_TABLET | ORAL | Status: AC
  Filled 2017-11-04: qty 1

## 2017-11-04 MED ORDER — LORAZEPAM 1 MG PO TABS
ORAL_TABLET | ORAL | Status: AC
  Filled 2017-11-04: qty 1

## 2017-11-04 MED ORDER — LORAZEPAM 1 MG PO TABS
ORAL_TABLET | ORAL | Status: AC
Start: 2017-11-04 — End: 2017-11-04
  Filled 2017-11-04: qty 1

## 2017-11-04 MED ORDER — BUSPIRONE HCL 10 MG PO TABS
10.0000 mg | ORAL_TABLET | Freq: Three times a day (TID) | ORAL | Status: DC
  Administered 2017-11-04 – 2017-11-05 (×3): 10 mg via ORAL
  Filled 2017-11-04 (×3): qty 1

## 2017-11-04 MED ORDER — BUPROPION HCL ER (XL) 300 MG PO TB24
300.0000 mg | ORAL_TABLET | Freq: Every day | ORAL | Status: DC
  Administered 2017-11-04 – 2017-11-05 (×2): 300 mg via ORAL
  Filled 2017-11-04 (×2): qty 1

## 2017-11-04 NOTE — ED Notes (Signed)
Pt complaining of lower back pain. Blood pressure elevated during CIWA assessment. Psychiatrist notified. Medications administered as ordered. Will continue t monitor. Maintained on 15 minute checks and observation by security camera for safety.

## 2017-11-04 NOTE — BH Assessment (Signed)
Assessment Note  Ian [name] is an 56 y.o. male who presents to the ER due to having thoughts of ending his life. He states he have the plan to overdose on his medications. He denies past attempts. He further reports, his main stressor is his living arrangements and his inability to stop drinking.  He admits to abusing alcohol and cocaine. Upon arrival to the ER, his BAC was 296.  He identified his sister as his primary support but they are not close.    During the interview, the patient was calm, cooperative and pleasant. He was able to provide appropriate answers to the questions. He denies history of violence and aggression. He have no upcoming court dates and no involvement with the legal system. Patient also denies HI and AV/H.   Diagnosis: Depression & Substance Abuse  Past Medical History:  Past Medical History:  Diagnosis Date  . Anxiety   . Barrett esophagus   . Cancer ([place])   . Coronary artery disease   . Depression   . GERD (gastroesophageal reflux disease)   . Hypertension   . Stroke [facility]) 2014   "mini-stroke" per patient    Past Surgical History:  Procedure Laterality Date  . ANGIOPLASTY    . APPENDECTOMY    . CARDIAC CATHETERIZATION    . CHOLECYSTECTOMY    . WRIST SURGERY Right    age 58    Family History:  Family History  Problem Relation Age of Onset  . Stroke Father   . Leukemia Father   . Breast cancer Sister     Social History:  reports that he has been smoking cigarettes.  He has been smoking about 1.00 pack per day. he has never used smokeless tobacco. He reports that he drinks alcohol. He reports that he uses drugs. Drugs: Cocaine and Marijuana.  Additional Social History:  Alcohol / Drug Use Pain Medications: See PTA Prescriptions: See PTA Over the Counter: See PTA History of alcohol / drug use?: Yes Longest period of sobriety (when/how long): Unable to quantify   Negative Consequences of Use: Personal relationships, Work / Youth worker,  Museum/gallery curator Withdrawal Symptoms: Cramps, Tremors, Nausea / Vomiting, Sweats Substance #1 Name of Substance 1: Alcohol  CIWA: CIWA-Ar BP: (!) 160/111 Pulse Rate: 89 Nausea and Vomiting: no nausea and no vomiting Tactile Disturbances: none Tremor: moderate, with patient's arms extended Auditory Disturbances: not present Paroxysmal Sweats: three Visual Disturbances: not present Anxiety: two Headache, Fullness in Head: none present Agitation: somewhat more than normal activity Orientation and Clouding of Sensorium: oriented and can do serial additions CIWA-Ar Total: 10 COWS:    Allergies:  Allergies  Allergen Reactions  . Fluoxetine Swelling    Anxiety, itching  . Hydrocodone-Acetaminophen Itching    Home Medications:  (Not in a hospital admission)  OB/GYN Status:  No LMP for male patient.  General Assessment Data Assessment unable to be completed: Yes Location of Assessment: [facility] ED TTS Assessment: In system Is this a Tele or Face-to-Face Assessment?: Face-to-Face Is this an Initial Assessment or a Re-assessment for this encounter?: Initial Assessment Marital status: Single Maiden name: n/a Is patient pregnant?: No Pregnancy Status: No Living Arrangements: Alone Can pt return to current living arrangement?: Yes Admission Status: Involuntary Is patient capable of signing voluntary admission?: No(Under IVC) Referral Source: Self/Family/Friend Insurance type: Medicaid  Medical Screening Exam ([place]) Medical Exam completed: Yes  Crisis Care Plan Living Arrangements: Alone Legal Guardian: Other:(Self) Name of Psychiatrist: Reports of none Name of Therapist: Reports  of none  Education Status Is patient currently in school?: No Current Grade: n/a Highest grade of school patient has completed: n/a Name of school: n/a Contact person: n/a  Risk to self with the past 6 months Suicidal Ideation: Yes-Currently Present Has patient been a risk to self  within the past 6 months prior to admission? : Yes Suicidal Intent: No Has patient had any suicidal intent within the past 6 months prior to admission? : No Is patient at risk for suicide?: Yes Suicidal Plan?: Yes-Currently Present Has patient had any suicidal plan within the past 6 months prior to admission? : Yes Specify Current Suicidal Plan: Overdose on medication Access to Means: Yes Specify Access to Suicidal Means: Have his personal medications What has been your use of drugs/alcohol within the last 12 months?: Alcohol & Cocaine Previous Attempts/Gestures: No How many times?: 0 Other Self Harm Risks: Active Addiction Triggers for Past Attempts: Other (Comment) Intentional Self Injurious Behavior: None Family Suicide History: No Recent stressful life event(s): Other (Comment) Persecutory voices/beliefs?: No Depression: Yes Depression Symptoms: Fatigue, Guilt, Tearfulness, Insomnia, Feeling worthless/self pity, Loss of interest in usual pleasures, Isolating Substance abuse history and/or treatment for substance abuse?: Yes Suicide prevention information given to non-admitted patients: Not applicable  Risk to Others within the past 6 months Homicidal Ideation: No Does patient have any lifetime risk of violence toward others beyond the six months prior to admission? : No Thoughts of Harm to Others: No Current Homicidal Intent: No Current Homicidal Plan: No Access to Homicidal Means: No Identified Victim: Reports of none History of harm to others?: No Assessment of Violence: None Noted Violent Behavior Description: Reports of none Does patient have access to weapons?: No Criminal Charges Pending?: No Does patient have a court date: No Is patient on probation?: No  Psychosis Hallucinations: None noted Delusions: None noted  Mental Status Report Appearance/Hygiene: Unremarkable, In scrubs Eye Contact: Fair Motor Activity: Freedom of movement, Unremarkable Speech:  Logical/coherent, Unremarkable Level of Consciousness: Alert Mood: Depressed, Anxious, Sad, Pleasant Affect: Appropriate to circumstance, Depressed, Sad Anxiety Level: Minimal Thought Processes: Coherent, Relevant Judgement: Unimpaired Orientation: Person, Place, Time, Situation, Appropriate for developmental age Obsessive Compulsive Thoughts/Behaviors: Minimal  Cognitive Functioning Concentration: Normal Memory: Recent Intact, Remote Intact IQ: Average Insight: Fair Impulse Control: Fair Appetite: Fair Weight Loss: 0 Weight Gain: 0 Sleep: No Change Total Hours of Sleep: 7 Vegetative Symptoms: None  ADLScreening [facility] Assessment Services) Patient's cognitive ability adequate to safely complete daily activities?: Yes Patient able to express need for assistance with ADLs?: Yes Independently performs ADLs?: Yes (appropriate for developmental age)  Prior Inpatient Therapy Prior Inpatient Therapy: Yes Prior Therapy Dates: 12/2016, 02/2015, 01/2014, 04/2013(11/2012, 10/2012, 05/2012, 04/2012) Prior Therapy Facilty/Provider(s): [facility] Reason for Treatment: Substance Use and Depression  Prior Outpatient Therapy Prior Outpatient Therapy: Yes Prior Therapy Dates: n/a Prior Therapy Facilty/Provider(s): n/a Reason for Treatment: n/a Does patient have an ACCT team?: No Does patient have Intensive In-House Services?  : No Does patient have [facility] services? : No Does patient have P4CC services?: No  ADL Screening (condition at time of admission) Patient's cognitive ability adequate to safely complete daily activities?: Yes Is the patient deaf or have difficulty hearing?: No Does the patient have difficulty seeing, even when wearing glasses/contacts?: No Does the patient have difficulty concentrating, remembering, or making decisions?: No Patient able to express need for assistance with ADLs?: Yes Does the patient have difficulty dressing or bathing?: No Independently  performs ADLs?: Yes (appropriate for  developmental age) Does the patient have difficulty walking or climbing stairs?: No Weakness of Legs: None Weakness of Arms/Hands: None  Home Assistive Devices/Equipment Home Assistive Devices/Equipment: None  Therapy Consults (therapy consults require a physician order) PT Evaluation Needed: No OT Evalulation Needed: No SLP Evaluation Needed: No   Values / Beliefs Cultural Requests During Hospitalization: None Spiritual Requests During Hospitalization: None Consults Spiritual Care Consult Needed: No Social Work Consult Needed: No Regulatory affairs officer (For Healthcare) Does Patient Have a Medical Advance Directive?: No Would patient like information on creating a medical advance directive?: Yes (ED - Information included in AVS)    Additional Information 1:1 In Past 12 Months?: No CIRT Risk: No Elopement Risk: No Does patient have medical clearance?: Yes  Child/Adolescent Assessment Running Away Risk: Denies(Patient is an adult)  Disposition:  Disposition Initial Assessment Completed for this Encounter: Yes Disposition of Patient: Pending Review with psychiatrist  On Site Evaluation by:   Reviewed with Physician:    Gunnar Fusi MS, LCAS, [place], [place], CCSI Therapeutic Triage Specialist 11/04/2017 3:49 PM

## 2017-11-04 NOTE — ED Notes (Signed)
Pt remains calm and cooperative. Compliant with all medications and VS. Blood pressure trending down. Maintained on 15 minute checks and observation by security camera for safety.

## 2017-11-04 NOTE — ED Notes (Signed)
Patient asleep in room. No noted distress or abnormal behavior. Will continue 15 minute checks and observation by security cameras for safety. 

## 2017-11-04 NOTE — Consult Note (Signed)
[place] Consult   Reason for Consult: Consult for 56 year old man with a history of depression and anxiety and substance abuse Referring Physician: Jimmye Norman Patient Identification: Ian [name] MRN:  [id number] Principal Diagnosis: Bipolar I disorder, most recent episode depressed with anxious distress [facility]) Diagnosis:   Patient Active Problem List   Diagnosis Date Noted  . Dyslipidemia [E78.5] 01/07/2017  . Barrett's esophagus [K22.70] 01/07/2017  . Bipolar I disorder, most recent episode depressed with anxious distress ([place]) [F31.9] 01/06/2017  . Cannabis use disorder, moderate, dependence ([place]) [F12.20] 01/06/2017  . Tobacco use disorder [F17.200] 01/06/2017  . Alcohol use disorder, moderate, dependence ([place]) [F10.20] 10/06/2016  . Substance induced mood disorder ([place]) [F19.94] 10/06/2016  . Cocaine use disorder, moderate, dependence ([place]) [F14.20] 10/06/2016  . Depression [F32.9] 10/06/2016  . Acute left-sided weakness [M62.89] 05/03/2015  . CVA (cerebral infarction) [I63.9] 05/03/2015  . Weakness [R53.1] 11/16/2013  . Chest pain [R07.9] 11/16/2013  . HTN (hypertension) [I10] 11/16/2013  . Left-sided weakness [R53.1] 11/16/2013  . Torsades de pointes ([place]) [I47.2] 11/16/2013  . Hemiplegia, unspecified, affecting nondominant side [G81.90] 11/15/2013  . Speech and language deficits [F80.9, R47.9] 11/15/2013    Total Time spent with patient: 1 hour  Subjective:   Ian [name] is a 56 y.o. male patient admitted with "I got fed up".  HPI: Patient interviewed chart reviewed.  56 year old man history of anxiety depression and alcohol abuse.  Came to the emergency room after calling 911 himself.  Mood has been depressed and getting worse for the last couple weeks.  Sleep is poor.  Feels fatigued all the time.  Constant intrusive thoughts of negativity.  Very poor self-care.  The patient presents as very dirty.  He has been drinking heavily on a daily basis and  has used cocaine.  Not certain if he has been taking his medicine regularly recently.  Does not report psychotic symptoms.  Not having any violent ideation towards others.  Social history: Patient lives in a very dilapidated and unsafe building.  Sounds like he does not have adequate services there.  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  Medical history: High blood pressure  Dyslipidemia  Substance abuse history: Chronic alcohol abuse.  Has been able to have some sobriety in the past but it has been a while since he is really kept going.  No known history of seizures or delirium tremens.    Past Psychiatric History: Patient has had multiple visits to psychiatry in the past.  Last admission to the hospital was to our hospital in January of this year.  Diagnosis at that time of depression and was stabilized on Wellbutrin and Seroquel after being detoxed from alcohol.  He follows up with [facility] when he does bother to go for treatment.  He is currently not following up regularly.  He has a past history of suicidal behavior no violence  Risk to Self: Is patient at risk for suicide?: Yes Risk to Others:   Prior Inpatient Therapy:   Prior Outpatient Therapy:    Past Medical History:  Past Medical History:  Diagnosis Date  . Anxiety   . Barrett esophagus   . Cancer ([place])   . Coronary artery disease   . Depression   . GERD (gastroesophageal reflux disease)   . Hypertension   . Stroke [facility]) 2014   "mini-stroke" per patient    Past Surgical History:  Procedure Laterality Date  . ANGIOPLASTY    . APPENDECTOMY    .  CARDIAC CATHETERIZATION    . CHOLECYSTECTOMY    . WRIST SURGERY Right    age 34   Family History:  Family History  Problem Relation Age of Onset  . Stroke Father   . Leukemia Father   . Breast cancer Sister    Family Psychiatric  History: Positive for alcohol abuse and depression no suicide in the family Social History:  Social History   Substance and Sexual Activity   Alcohol Use Yes   Comment: denies being a daily drinker but did have alcohol today     Social History   Substance and Sexual Activity  Drug Use Yes  . Types: Cocaine, Marijuana   Comment: used cocaine today; denies smoking marijuana for years    Social History   Socioeconomic History  . Marital status: Single    Spouse name: None  . Number of children: None  . Years of education: None  . Highest education level: None  Social Needs  . Financial resource strain: None  . Food insecurity - worry: None  . Food insecurity - inability: None  . Transportation needs - medical: None  . Transportation needs - non-medical: None  Occupational History  . None  Tobacco Use  . Smoking status: Current Every Day Smoker    Packs/day: 1.00    Types: Cigarettes  . Smokeless tobacco: Never Used  Substance and Sexual Activity  . Alcohol use: Yes    Comment: denies being a daily drinker but did have alcohol today  . Drug use: Yes    Types: Cocaine, Marijuana    Comment: used cocaine today; denies smoking marijuana for years  . Sexual activity: None  Other Topics Concern  . None  Social History Narrative  . None   Additional Social History:    Allergies:   Allergies  Allergen Reactions  . Fluoxetine Swelling    Anxiety, itching  . Hydrocodone-Acetaminophen Itching    Labs:  Results for orders placed or performed during the hospital encounter of 11/03/17 (from the past 48 hour(s))  Comprehensive metabolic panel     Status: Abnormal   Collection Time: 11/03/17  6:41 PM  Result Value Ref Range   Sodium 136 135 - 145 mmol/L   Potassium 4.0 3.5 - 5.1 mmol/L   Chloride 103 101 - 111 mmol/L   CO2 21 (L) 22 - 32 mmol/L   Glucose, Bld 98 65 - 99 mg/dL   BUN 10 6 - 20 mg/dL   Creatinine, Ser 0.79 0.61 - 1.24 mg/dL   Calcium 9.1 8.9 - 10.3 mg/dL   Total Protein 7.5 6.5 - 8.1 g/dL   Albumin 3.9 3.5 - 5.0 g/dL   AST 49 (H) 15 - 41 U/L   ALT 70 (H) 17 - 63 U/L   Alkaline Phosphatase  61 38 - 126 U/L   Total Bilirubin 0.7 0.3 - 1.2 mg/dL   GFR calc non Af Amer >60 >60 mL/min   GFR calc Af Amer >60 >60 mL/min    Comment: (NOTE) The eGFR has been calculated using the CKD EPI equation. This calculation has not been validated in all clinical situations. eGFR's persistently <60 mL/min signify possible Chronic Kidney Disease.    Anion gap 12 5 - 15  Ethanol     Status: Abnormal   Collection Time: 11/03/17  6:41 PM  Result Value Ref Range   Alcohol, Ethyl (B) 268 (H) <10 mg/dL    Comment:        LOWEST DETECTABLE LIMIT  FOR SERUM ALCOHOL IS 10 mg/dL FOR MEDICAL PURPOSES ONLY   Salicylate level     Status: None   Collection Time: 11/03/17  6:41 PM  Result Value Ref Range   Salicylate Lvl <5.8 2.8 - 30.0 mg/dL  Acetaminophen level     Status: Abnormal   Collection Time: 11/03/17  6:41 PM  Result Value Ref Range   Acetaminophen (Tylenol), Serum <10 (L) 10 - 30 ug/mL    Comment:        THERAPEUTIC CONCENTRATIONS VARY SIGNIFICANTLY. A RANGE OF 10-30 ug/mL MAY BE AN EFFECTIVE CONCENTRATION FOR MANY PATIENTS. HOWEVER, SOME ARE BEST TREATED AT CONCENTRATIONS OUTSIDE THIS RANGE. ACETAMINOPHEN CONCENTRATIONS >150 ug/mL AT 4 HOURS AFTER INGESTION AND >50 ug/mL AT 12 HOURS AFTER INGESTION ARE OFTEN ASSOCIATED WITH TOXIC REACTIONS.   cbc     Status: Abnormal   Collection Time: 11/03/17  6:41 PM  Result Value Ref Range   WBC 12.9 (H) 3.8 - 10.6 K/uL   RBC 6.00 (H) 4.40 - 5.90 MIL/uL   Hemoglobin 17.9 13.0 - 18.0 g/dL   HCT 54.3 (H) 40.0 - 52.0 %   MCV 90.4 80.0 - 100.0 fL   MCH 29.7 26.0 - 34.0 pg   MCHC 32.9 32.0 - 36.0 g/dL   RDW 13.1 11.5 - 14.5 %   Platelets 206 150 - 440 K/uL  Urine Drug Screen, Qualitative     Status: Abnormal   Collection Time: 11/03/17  7:13 PM  Result Value Ref Range   Tricyclic, Ur Screen NONE DETECTED NONE DETECTED   Amphetamines, Ur Screen NONE DETECTED NONE DETECTED   MDMA (Ecstasy)Ur Screen NONE DETECTED NONE DETECTED   Cocaine  Metabolite,Ur [place] POSITIVE (A) NONE DETECTED   Opiate, Ur Screen NONE DETECTED NONE DETECTED   Phencyclidine (PCP) Ur S NONE DETECTED NONE DETECTED   Cannabinoid 50 Ng, Ur [place] NONE DETECTED NONE DETECTED   Barbiturates, Ur Screen NONE DETECTED NONE DETECTED   Benzodiazepine, Ur Scrn NONE DETECTED NONE DETECTED   Methadone Scn, Ur NONE DETECTED NONE DETECTED    Comment: (NOTE) 850  Tricyclics, urine               Cutoff 1000 ng/mL 200  Amphetamines, urine             Cutoff 1000 ng/mL 300  MDMA (Ecstasy), urine           Cutoff 500 ng/mL 400  Cocaine Metabolite, urine       Cutoff 300 ng/mL 500  Opiate, urine                   Cutoff 300 ng/mL 600  Phencyclidine (PCP), urine      Cutoff 25 ng/mL 700  Cannabinoid, urine              Cutoff 50 ng/mL 800  Barbiturates, urine             Cutoff 200 ng/mL 900  Benzodiazepine, urine           Cutoff 200 ng/mL 1000 Methadone, urine                Cutoff 300 ng/mL 1100 1200 The urine drug screen provides only a preliminary, unconfirmed 1300 analytical test result and should not be used for non-medical 1400 purposes. Clinical consideration and professional judgment should 1500 be applied to any positive drug screen result due to possible 1600 interfering substances. A more specific alternate chemical method 1700 must be used in order  to obtain a confirmed analytical result.  1800 Gas chromato graphy / mass spectrometry (GC/MS) is the preferred 1900 confirmatory method.     Current Facility-Administered Medications  Medication Dose Route Frequency Provider Last Rate Last Dose  . LORazepam (ATIVAN) 1 MG tablet           . LORazepam (ATIVAN) 1 MG tablet           . aspirin EC tablet 81 mg  81 mg Oral Daily Caelie Remsburg T, MD      . atorvastatin (LIPITOR) tablet 40 mg  40 mg Oral q1800 Jovoni Borkenhagen T, MD      . buPROPion (WELLBUTRIN XL) 24 hr tablet 300 mg  300 mg Oral Daily Elani Delph T, MD      . busPIRone (BUSPAR) tablet 10 mg  10 mg  Oral TID Ilhan Debenedetto T, MD      . lisinopril (PRINIVIL,ZESTRIL) tablet 10 mg  10 mg Oral Daily Jamilynn Whitacre T, MD      . LORazepam (ATIVAN) injection 0-4 mg  0-4 mg Intravenous Q6H Schaevitz, Randall An, MD       Or  . LORazepam (ATIVAN) tablet 0-4 mg  0-4 mg Oral Q6H Schaevitz, Randall An, MD   1 mg at 11/04/17 1424  . [START ON 11/06/2017] LORazepam (ATIVAN) injection 0-4 mg  0-4 mg Intravenous Q12H Orbie Pyo, MD       Or  . [name] ON 11/06/2017] LORazepam (ATIVAN) tablet 0-4 mg  0-4 mg Oral Q12H Orbie Pyo, MD      . QUEtiapine (SEROQUEL) tablet 400 mg  400 mg Oral QHS Gregor Hams, MD   400 mg at 11/03/17 2345  . thiamine (VITAMIN B-1) tablet 100 mg  100 mg Oral Daily Orbie Pyo, MD   100 mg at 11/04/17 6256   Or  . thiamine (B-1) injection 100 mg  100 mg Intravenous Daily Orbie Pyo, MD       Current Outpatient Medications  Medication Sig Dispense Refill  . aspirin 81 MG EC tablet Take 1 tablet (81 mg total) by mouth daily. 30 tablet 0  . atorvastatin (LIPITOR) 40 MG tablet Take 1 tablet (40 mg total) by mouth daily at 6 PM. 30 tablet 0  . busPIRone (BUSPAR) 10 MG tablet Take 10 mg 2 (two) times daily by mouth.  2  . lisinopril (PRINIVIL,ZESTRIL) 10 MG tablet Take 1 tablet (10 mg total) by mouth daily. 30 tablet 0  . Melatonin 5 MG TABS Take 2 tablets (10 mg total) by mouth at bedtime. 30 tablet 0  . QUEtiapine (SEROQUEL XR) 400 MG 24 hr tablet Take 400 mg at bedtime by mouth.    . QUEtiapine (SEROQUEL) 50 MG tablet Take 1 tablet (50 mg total) by mouth 2 (two) times daily. 60 tablet 1  . buPROPion (WELLBUTRIN XL) 150 MG 24 hr tablet Take 1 tablet (150 mg total) by mouth daily. (Patient not taking: Reported on 11/03/2017) 30 tablet 1  . calcium carbonate (TUMS) 500 MG chewable tablet Chew 2 tablets (400 mg of elemental calcium total) by mouth 3 (three) times daily as needed for indigestion or heartburn. (Patient not  taking: Reported on 11/03/2017) 90 tablet 0  . QUEtiapine (SEROQUEL XR) 300 MG 24 hr tablet Take 1 tablet (300 mg total) by mouth at bedtime. (Patient not taking: Reported on 11/03/2017) 30 tablet 1    Musculoskeletal: Strength & Muscle Tone: within normal limits Gait & Station: normal Patient  leans: N/A  Psychiatric Specialty Exam: Physical Exam  Nursing note and vitals reviewed. Constitutional: He appears well-developed and well-nourished.  HENT:  Head: Normocephalic and atraumatic.  Eyes: Conjunctivae are normal. Pupils are equal, round, and reactive to light.  Neck: Normal range of motion.  Cardiovascular: Regular rhythm and normal heart sounds.  Respiratory: Effort normal and breath sounds normal.  GI: Soft.  Musculoskeletal: Normal range of motion.  Neurological: He is alert.  Skin: Skin is warm and dry.  Psychiatric: His speech is delayed. He is slowed. He expresses impulsivity. He exhibits a depressed mood. He expresses suicidal ideation. He exhibits abnormal recent memory.    Review of Systems  Constitutional: Negative.   HENT: Negative.   Eyes: Negative.   Respiratory: Negative.   Cardiovascular: Negative.   Gastrointestinal: Negative.   Musculoskeletal: Negative.   Skin: Negative.   Neurological: Negative.   Psychiatric/Behavioral: Positive for depression, memory loss, substance abuse and suicidal ideas. Negative for hallucinations. The patient is nervous/anxious and has insomnia.     Blood pressure (!) 160/111, pulse 89, temperature 97.6 F (36.4 C), temperature source Oral, resp. rate 20, height 6' (1.829 m), weight 117.9 kg (260 lb), SpO2 97 %.Body mass index is 35.26 kg/m.  General Appearance: Disheveled  Eye Contact:  Fair  Speech:  Slow  Volume:  Decreased  Mood:  Depressed  Affect:  Depressed  Thought Process:  Goal Directed  Orientation:  Full (Time, Place, and Person)  Thought Content:  Logical, Rumination and Tangential  Suicidal Thoughts:  Yes.   with intent/plan  Homicidal Thoughts:  No  Memory:  Immediate;   Fair Recent;   Fair Remote;   Fair  Judgement:  Fair  Insight:  Fair  Psychomotor Activity:  Decreased  Concentration:  Concentration: Poor  Recall:  [employer] of Knowledge:  Fair  Language:  Fair  Akathisia:  No  Handed:  Right  AIMS (if indicated):     Assets:  Desire for Improvement Resilience  ADL's:  Intact  Cognition:  Impaired,  Mild  Sleep:        Treatment Plan Summary: Daily contact with patient to assess and evaluate symptoms and progress in treatment, Medication management and Plan 56 year old man with depression and anxiety alcohol abuse.  Being treated with detox protocol.  Restart his medicines of Seroquel BuSpar and Wellbutrin as previously ordered.  Labs reviewed including the EKG.  We do not have beds available on the unit now but can work on referring him to alternative hospitals.  Continue IV C.  Supportive counseling.  Monitor for any further signs of withdrawal or delirium  Disposition: Recommend psychiatric Inpatient admission when medically cleared. Supportive therapy provided about ongoing stressors. Discussed crisis plan, support from social network, calling 911, coming to the Emergency Department, and calling Suicide Hotline.  Alethia Berthold, MD 11/04/2017 2:52 PM

## 2017-11-05 ENCOUNTER — Other Ambulatory Visit: Payer: Self-pay

## 2017-11-05 ENCOUNTER — Inpatient Hospital Stay
Admission: AD | Admit: 2017-11-05 | Discharge: 2017-11-16 | DRG: 885 | Disposition: A | Discharge status: Home / Self Care | Payer: Medicaid Other | Source: Intra-hospital | Attending: Psychiatry | Admitting: Psychiatry

## 2017-11-05 DIAGNOSIS — F17210 Nicotine dependence, cigarettes, uncomplicated: Secondary | ICD-10-CM | POA: Diagnosis present

## 2017-11-05 DIAGNOSIS — F10920 Alcohol use, unspecified with intoxication, uncomplicated: Secondary | ICD-10-CM | POA: Diagnosis not present

## 2017-11-05 DIAGNOSIS — F1010 Alcohol abuse, uncomplicated: Secondary | ICD-10-CM | POA: Diagnosis present

## 2017-11-05 DIAGNOSIS — I10 Essential (primary) hypertension: Secondary | ICD-10-CM | POA: Diagnosis present

## 2017-11-05 DIAGNOSIS — Z79899 Other long term (current) drug therapy: Secondary | ICD-10-CM

## 2017-11-05 DIAGNOSIS — K219 Gastro-esophageal reflux disease without esophagitis: Secondary | ICD-10-CM | POA: Diagnosis present

## 2017-11-05 DIAGNOSIS — E785 Hyperlipidemia, unspecified: Secondary | ICD-10-CM | POA: Diagnosis present

## 2017-11-05 DIAGNOSIS — F419 Anxiety disorder, unspecified: Secondary | ICD-10-CM | POA: Diagnosis present

## 2017-11-05 DIAGNOSIS — R45851 Suicidal ideations: Secondary | ICD-10-CM | POA: Diagnosis present

## 2017-11-05 DIAGNOSIS — Z8673 Personal history of transient ischemic attack (TIA), and cerebral infarction without residual deficits: Secondary | ICD-10-CM

## 2017-11-05 DIAGNOSIS — Z915 Personal history of self-harm: Secondary | ICD-10-CM

## 2017-11-05 DIAGNOSIS — F332 Major depressive disorder, recurrent severe without psychotic features: Secondary | ICD-10-CM | POA: Diagnosis present

## 2017-11-05 DIAGNOSIS — K2270 Barrett's esophagus without dysplasia: Secondary | ICD-10-CM | POA: Diagnosis present

## 2017-11-05 DIAGNOSIS — G4700 Insomnia, unspecified: Secondary | ICD-10-CM | POA: Diagnosis present

## 2017-11-05 DIAGNOSIS — I2510 Atherosclerotic heart disease of native coronary artery without angina pectoris: Secondary | ICD-10-CM | POA: Diagnosis present

## 2017-11-05 MED ORDER — LORAZEPAM 2 MG/ML IJ SOLN: 1.0000 mg | Freq: Four times a day (QID) | INTRAMUSCULAR | Status: AC | PRN

## 2017-11-05 MED ORDER — LORAZEPAM 1 MG PO TABS
1.0000 mg | ORAL_TABLET | Freq: Four times a day (QID) | ORAL | Status: AC | PRN
  Administered 2017-11-07: 1 mg via ORAL
  Filled 2017-11-05: qty 1

## 2017-11-05 MED ORDER — ATORVASTATIN CALCIUM 20 MG PO TABS
40.0000 mg | ORAL_TABLET | Freq: Every day | ORAL | Status: DC
  Administered 2017-11-05 – 2017-11-15 (×11): 40 mg via ORAL
  Filled 2017-11-05 (×11): qty 2

## 2017-11-05 MED ORDER — HYDROXYZINE HCL 25 MG PO TABS
25.0000 mg | ORAL_TABLET | ORAL | Status: DC | PRN
  Administered 2017-11-05: 25 mg via ORAL
  Filled 2017-11-05: qty 1

## 2017-11-05 MED ORDER — LORAZEPAM 1 MG PO TABS
ORAL_TABLET | ORAL | Status: AC
  Administered 2017-11-05: 06:00:00
  Filled 2017-11-05: qty 1

## 2017-11-05 MED ORDER — NICOTINE 21 MG/24HR TD PT24
21.0000 mg | MEDICATED_PATCH | Freq: Every day | TRANSDERMAL | Status: DC
Start: 2017-11-05 — End: 2017-11-05
  Administered 2017-11-05: 21 mg via TRANSDERMAL
  Filled 2017-11-05: qty 1

## 2017-11-05 MED ORDER — BUSPIRONE HCL 5 MG PO TABS
10.0000 mg | ORAL_TABLET | Freq: Three times a day (TID) | ORAL | Status: DC
  Administered 2017-11-05 – 2017-11-16 (×32): 10 mg via ORAL
  Filled 2017-11-05 (×34): qty 2

## 2017-11-05 MED ORDER — VITAMIN B-1 100 MG PO TABS
100.0000 mg | ORAL_TABLET | Freq: Every day | ORAL | Status: DC
  Administered 2017-11-06 – 2017-11-16 (×11): 100 mg via ORAL
  Filled 2017-11-05 (×13): qty 1

## 2017-11-05 MED ORDER — QUETIAPINE FUMARATE ER 300 MG PO TB24
300.0000 mg | ORAL_TABLET | Freq: Every day | ORAL | Status: DC
  Administered 2017-11-05: 300 mg via ORAL
  Filled 2017-11-05: qty 1

## 2017-11-05 MED ORDER — INFLUENZA VAC SPLIT QUAD 0.5 ML IM SUSY: 0.5000 mL | PREFILLED_SYRINGE | INTRAMUSCULAR | Status: DC

## 2017-11-05 MED ORDER — THIAMINE HCL 100 MG/ML IJ SOLN
100.0000 mg | Freq: Every day | INTRAMUSCULAR | Status: DC
  Filled 2017-11-05 (×12): qty 1

## 2017-11-05 MED ORDER — MAGNESIUM HYDROXIDE 400 MG/5ML PO SUSP: 30.0000 mL | Freq: Every day | ORAL | Status: DC | PRN

## 2017-11-05 MED ORDER — ACETAMINOPHEN 325 MG PO TABS
650.0000 mg | ORAL_TABLET | Freq: Four times a day (QID) | ORAL | Status: DC | PRN
  Administered 2017-11-06: 650 mg via ORAL
  Filled 2017-11-05: qty 2

## 2017-11-05 MED ORDER — BUPROPION HCL ER (XL) 150 MG PO TB24
300.0000 mg | ORAL_TABLET | Freq: Every day | ORAL | Status: DC
  Administered 2017-11-06: 300 mg via ORAL
  Filled 2017-11-05: qty 2

## 2017-11-05 MED ORDER — LISINOPRIL 5 MG PO TABS
5.0000 mg | ORAL_TABLET | Freq: Every day | ORAL | Status: DC
  Administered 2017-11-05 – 2017-11-11 (×7): 5 mg via ORAL
  Filled 2017-11-05 (×7): qty 1

## 2017-11-05 MED ORDER — ALUM & MAG HYDROXIDE-SIMETH 200-200-20 MG/5ML PO SUSP
30.0000 mL | ORAL | Status: DC | PRN
  Filled 2017-11-05: qty 30

## 2017-11-05 MED ORDER — ADULT MULTIVITAMIN W/MINERALS CH
1.0000 | ORAL_TABLET | Freq: Every day | ORAL | Status: DC
  Administered 2017-11-05 – 2017-11-16 (×12): 1 via ORAL
  Filled 2017-11-05 (×12): qty 1

## 2017-11-05 MED ORDER — FOLIC ACID 1 MG PO TABS
1.0000 mg | ORAL_TABLET | Freq: Every day | ORAL | Status: DC
  Administered 2017-11-05 – 2017-11-16 (×12): 1 mg via ORAL
  Filled 2017-11-05 (×11): qty 1

## 2017-11-05 MED ORDER — ASPIRIN EC 81 MG PO TBEC
81.0000 mg | DELAYED_RELEASE_TABLET | Freq: Every day | ORAL | Status: DC
  Administered 2017-11-06 – 2017-11-16 (×11): 81 mg via ORAL
  Filled 2017-11-05 (×11): qty 1

## 2017-11-05 NOTE — ED Notes (Signed)
Pt is alert and oriented this evening. Pt mood is depressed and affect is agitated. Pt CIWA is 15 at this time and 2mg  Ativan given. Pt also co 8/10 back pain and Ibuprofen administered. Pt has poor hygiene and declined to shower at this time. However, has has been pleasant and cooperative with this Probation officer. Food and drink provided and pt denies SI/HI and AVH. Pt slept all night and 15 minute checks ongoing for safety.

## 2017-11-05 NOTE — Progress Notes (Signed)
Patient ID: Ian [name], male   DOB: Jul 08, 1961, 56 y.o.   MRN: [id number]  Patient arrived to room 303 of Behavioral Medicine from ED reporting increased depression and suicidal ideation. Reports that the holiday season is difficult for him. A&O x4. Patient also states that his health condition is difficult for him to cope with. Has history of Barrett esophagus which he states he does not have a regular health care provider for. Additional medical history incudes anxiety, cancer, depression, GERD, and HTN. Patient is calm and cooperative with admission process. Patient is flat and depressed, disheveled in appearance with body odor present. Patient reports increase in alcohol consumption, drinking 3-4 40 oz beers sporadically. UDS + cocaine. Per report, patient lives alone in unkempt living conditions. Has hoarder characteristics and lives in squalor. Reports having the support of a close friend who intends to visit him while here. Patient presents with passive SI and contracts for safety upon admission. Patient denies HI/AVH. Plan of care reviewed with patient and patient verbalizes understanding. Patient, patient clothing, and belongings searched with no contraband found.  Skin assessed with RN. Skin unremarkable and clear of any abnormal marks with the exception of prior surgical scars to knees and R inner wrist and small healing cut noted on the L posterior foot. Plan of care and unit policies explained. Understanding verbalized. Consents obtained. No additional questions or concerns at this time. Linens provided. Patient is currently safe and in room at this time.

## 2017-11-05 NOTE — ED Notes (Signed)
Patient requested that His wallet be released to His friend Micheal that came to visit, nurse did give wallet to him per consent of Patient.

## 2017-11-05 NOTE — BHH Group Notes (Signed)
[facility] Group Notes:  (Nursing/MHT/Case Management/Adjunct)  Date:  11/05/2017  Time:  9:34 PM  Type of Therapy:  Group Therapy  Participation Level:  Active  Participation Quality:  Appropriate  Affect:  Appropriate  Cognitive:  Alert  Insight:  Appropriate  Engagement in Group:  Supportive  Modes of Intervention:  Activity  Summary of Progress/Problems:  Ian [name] 11/05/2017, 9:34 PM

## 2017-11-05 NOTE — BHH Group Notes (Signed)
[facility] Group Notes:  (Nursing/MHT/Case Management/Adjunct)  Date:  11/05/2017  Time:  7:30 PM  Type of Therapy:  Psychoeducational Skills  Participation Level:  Active  Participation Quality:  Appropriate, Attentive and Drowsy  Affect:  Appropriate  Cognitive:  Appropriate  Insight:  Appropriate and Good  Engagement in Group:  Engaged  Modes of Intervention:  Discussion and Education  Summary of Progress/Problems:  Ian [name] 11/05/2017, 7:30 PM

## 2017-11-05 NOTE — BH Assessment (Signed)
Patient is to be admitted to [facility] by Dr. Weber Cooks.  Attending Physician will be Dr. Everardo Beals.   Patient has been assigned to room 303, by [facility] Charge Nurse Aldora   Intake Paper Work has been signed and placed on patient chart.  ER staff is aware of the admission Lattie Haw, ER Sect.; Dr. Denzil Hughes, ER MD; Laymond Purser, Patient's Nurse & Vonna Kotyk, Patient Access).

## 2017-11-05 NOTE — ED Provider Notes (Signed)
-----------------------------------------   7:11 AM on 11/05/2017 -----------------------------------------   Blood pressure (!) 124/95, pulse 87, temperature (!) 97.4 F (36.3 C), temperature source Oral, resp. rate 18, height 6' (1.829 m), weight 117.9 kg (260 lb), SpO2 96 %.  Patient required some Ativan per the [place] scale overnight, symptoms are controlled.  Calm and cooperative at this time.  Disposition is pending Psychiatry/Behavioral Medicine team recommendations.     Carrie Mew, MD 11/05/17 [phone]

## 2017-11-05 NOTE — ED Notes (Signed)
Visitor here to see Patient, visit supervised.

## 2017-11-05 NOTE — Tx Team (Signed)
Initial Treatment Plan 11/05/2017 3:58 PM Ian [name] TRR:[id number]    PATIENT STRESSORS: Financial difficulties Health problems Medication change or noncompliance Substance abuse   PATIENT STRENGTHS: Ability for insight Average or above average intelligence Communication skills   PATIENT IDENTIFIED PROBLEMS: Major depression 11/05/17  Suicidality 11/05/17  Unkempt living circumstances (hoarding characteristics). 11/05/17  "Does not have primary care provider". 11/05/17               DISCHARGE CRITERIA:  Ability to meet basic life and health needs Adequate post-discharge living arrangements Improved stabilization in mood, thinking, and/or behavior Withdrawal symptoms are absent or subacute and managed without 24-hour nursing intervention  PRELIMINARY DISCHARGE PLAN: Outpatient therapy Return to previous living arrangement  PATIENT/FAMILY INVOLVEMENT: This treatment plan has been presented to and reviewed with the patient, Ian [name], and/or family member.  The patient and family have been given the opportunity to ask questions and make suggestions.  Dianah Field, RN 11/05/2017, 3:58 PM

## 2017-11-05 NOTE — ED Notes (Signed)
Nurse reported to RN in [facility] regarding Patient.

## 2017-11-05 NOTE — ED Notes (Signed)
Patient consumed 100% of breakfast and beverage, no signs of distress, Patient clam and cooperative, q 15 minute checks and camera surveillance in progress for safety.

## 2017-11-05 NOTE — ED Notes (Signed)
Patient under IVC, Patient being discharged and readmit to [facility], patient voices understanding , all belongings taken with Patient to lower level, Patient transferred via w/c with police escort.

## 2017-11-05 NOTE — ED Notes (Signed)
Patient took morning medications without difficulty, also took tylenol for back pain, nurse will continue to monitor, patient denies Si/hi or avh at this time, but does states " I feel like giving up" Patient is depressed and has flat affect. q 15 minute checks and camera surveillance in progress for safety.

## 2017-11-06 DIAGNOSIS — F332 Major depressive disorder, recurrent severe without psychotic features: Principal | ICD-10-CM

## 2017-11-06 LAB — HEMOGLOBIN A1C
Hgb A1c MFr Bld: 5.5 % (ref 4.8–5.6)
MEAN PLASMA GLUCOSE: 111.15 mg/dL

## 2017-11-06 LAB — LIPID PANEL
CHOL/HDL RATIO: 3.3 ratio
CHOLESTEROL: 112 mg/dL (ref 0–200)
HDL: 34 mg/dL — ABNORMAL LOW (ref >40)
LDL Cholesterol: 58 mg/dL (ref 0–99)
Triglycerides: 98 mg/dL (ref <150)
VLDL: 20 mg/dL (ref 0–40)

## 2017-11-06 LAB — TSH: TSH: 2.798 u[IU]/mL (ref 0.350–4.500)

## 2017-11-06 MED ORDER — QUETIAPINE FUMARATE 25 MG PO TABS: 25.0000 mg | ORAL_TABLET | Freq: Three times a day (TID) | ORAL | Status: DC | PRN

## 2017-11-06 MED ORDER — QUETIAPINE FUMARATE ER 200 MG PO TB24
400.0000 mg | ORAL_TABLET | Freq: Every day | ORAL | Status: DC
  Administered 2017-11-06 – 2017-11-09 (×4): 400 mg via ORAL
  Filled 2017-11-06 (×4): qty 2

## 2017-11-06 MED ORDER — ALBUTEROL SULFATE HFA 108 (90 BASE) MCG/ACT IN AERS
1.0000 | INHALATION_SPRAY | Freq: Every day | RESPIRATORY_TRACT | Status: DC | PRN
  Administered 2017-11-06 – 2017-11-14 (×10): 1 via RESPIRATORY_TRACT
  Filled 2017-11-06 (×2): qty 6.7

## 2017-11-06 MED ORDER — VENLAFAXINE HCL ER 75 MG PO CP24
75.0000 mg | ORAL_CAPSULE | Freq: Every day | ORAL | Status: DC
  Administered 2017-11-07 – 2017-11-09 (×3): 75 mg via ORAL
  Filled 2017-11-06 (×3): qty 1

## 2017-11-06 MED ORDER — NICOTINE 21 MG/24HR TD PT24
21.0000 mg | MEDICATED_PATCH | Freq: Every day | TRANSDERMAL | Status: DC
  Administered 2017-11-06 – 2017-11-15 (×10): 21 mg via TRANSDERMAL
  Filled 2017-11-06 (×10): qty 1

## 2017-11-06 NOTE — BHH Suicide Risk Assessment (Signed)
[place] INPATIENT:  Family/Significant Other Suicide Prevention Education  Suicide Prevention Education:  Contact Attempts: Judeen Hammans, friend, [phone], (name of family member/significant other) has been identified by the patient as the family member/significant other with whom the patient will be residing, and identified as the person(s) who will aid the patient in the event of a mental health crisis.  With written consent from the patient, two attempts were made to provide suicide prevention education, prior to and/or following the patient's discharge.  We were unsuccessful in providing suicide prevention education.  A suicide education pamphlet was given to the patient to share with family/significant other.  Date and time of first attempt: 11/16 at 3 PM, did not leave VM as mailbox was not personally identified.   Beverely Pace 11/06/2017, 2:55 PM

## 2017-11-06 NOTE — Plan of Care (Signed)
Patient verbalized his depression and anxiety 6/10.Denies any signs of withdrawing.Patient verbalized that he is happy with the treatment here.Denies SI,HI and AVH.Compliant with medications.Appropriate with staff & peers.Attended groups.Appetite and energy level good.Support and encouragement given.

## 2017-11-06 NOTE — BHH Suicide Risk Assessment (Signed)
[facility] Admission Suicide Risk Assessment   Nursing information obtained from:    Demographic factors:   56 yo caucasian male, living alone Current Mental Status:   Depressed Loss Factors:   Loss of relationships, loss of job Historical Factors:   history of multiple hospitlaizations  Total Time spent with patient: 45 minutes Principal Problem: Severe recurrent major depression without psychotic features ([place]) Diagnosis:   Patient Active Problem List   Diagnosis Date Noted  . Severe recurrent major depression without psychotic features ([place]) [F33.2] 11/05/2017  . Dyslipidemia [E78.5] 01/07/2017  . Barrett's esophagus [K22.70] 01/07/2017  . Bipolar I disorder, most recent episode depressed with anxious distress ([place]) [F31.9] 01/06/2017  . Cannabis use disorder, moderate, dependence ([place]) [F12.20] 01/06/2017  . Tobacco use disorder [F17.200] 01/06/2017  . Alcohol use disorder, moderate, dependence ([place]) [F10.20] 10/06/2016  . Substance induced mood disorder ([place]) [F19.94] 10/06/2016  . Cocaine use disorder, moderate, dependence ([place]) [F14.20] 10/06/2016  . Depression [F32.9] 10/06/2016  . Acute left-sided weakness [M62.89] 05/03/2015  . CVA (cerebral infarction) [I63.9] 05/03/2015  . Weakness [R53.1] 11/16/2013  . Chest pain [R07.9] 11/16/2013  . HTN (hypertension) [I10] 11/16/2013  . Left-sided weakness [R53.1] 11/16/2013  . Torsades de pointes ([place]) [I47.2] 11/16/2013  . Hemiplegia, unspecified, affecting nondominant side [G81.90] 11/15/2013  . Speech and language deficits [F80.9, R47.9] 11/15/2013   Subjective Data: See H&P  Continued Clinical Symptoms:  Alcohol Use Disorder Identification Test Final Score (AUDIT): 13 The "Alcohol Use Disorders Identification Test", Guidelines for Use in Primary Care, Second Edition.  World Pharmacologist [facility]). Score between 0-7:  no or low risk or alcohol related problems. Score between 8-15:  moderate risk of alcohol related  problems. Score between 16-19:  high risk of alcohol related problems. Score 20 or above:  warrants further diagnostic evaluation for alcohol dependence and treatment.   CLINICAL FACTORS:   Depression:   Anhedonia Hopelessness Insomnia   Musculoskeletal: Strength & Muscle Tone: within normal limits Gait & Station: normal Patient leans: N/A  Psychiatric Specialty Exam: Physical Exam  ROS  Blood pressure (!) 127/96, pulse 82, temperature 97.9 F (36.6 C), temperature source Oral, resp. rate 16, height 6' (1.829 m), weight 118.4 kg (261 lb), SpO2 99 %.Body mass index is 35.4 kg/m.      COGNITIVE FEATURES THAT CONTRIBUTE TO RISK:  None    SUICIDE RISK:   Moderate:  Frequent suicidal ideation with limited intensity, and duration, some specificity in terms of plans, no associated intent, good self-control, limited dysphoria/symptomatology, some risk factors present, and identifiable protective factors, including available and accessible social support.  PLAN OF CARE: Medication changes to address anxiety and depression  I certify that inpatient services furnished can reasonably be expected to improve the patient's condition.   Marylin Crosby, MD 11/06/2017, 3:16 PM

## 2017-11-06 NOTE — Progress Notes (Signed)
Recreation Therapy Notes  INPATIENT RECREATION THERAPY ASSESSMENT  Patient Details Name: Ian [name] MRN: [id number] DOB: 08-11-1961 Today's Date: 11/06/2017  Patient Stressors: Family, Work  Coping Skills:   Isolate, Avoidance, Exercise  Personal Challenges: Communication, Concentration, Relationships, Self-Esteem/Confidence, Technical sales engineer, Stress Management, Substance Abuse, Trusting Others  Leisure Interests (2+):  Sports - Golf  Awareness of Community Resources:  Yes  Community Resources:     Current Use: No  If no, Barriers?: Financial  Patient Strengths:  Golf, Debate  Patient Identified Areas of Improvement:  Taking things one at a time  Current Recreation Participation:  Golf  Patient Goal for Hospitalization:  To find a happy medium   [place] of Residence:  [place] of Residence:  [place]   Current SI (including self-harm):  No  Current HI:  No  Consent to Intern Participation: N/A   [name] 11/06/2017, 2:58 PM
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
## 2017-11-06 NOTE — Progress Notes (Signed)
Pleasant on contact, denies symptoms of detox. Endorses anxiety 8/10 and depression 6/10. Denies SI or AVH. Is visible in the milieu, selectively social. Affect is blunted. Med compliant. Remains on routine obs for safety.

## 2017-11-06 NOTE — Progress Notes (Signed)
Recreation Therapy Notes   Date: 11.16.18  Time: 9:30 am  Location: Craft Room  Behavioral response: Appropriate  Intervention Topic: Teambuilding  Discussion/Intervention: Group content on today was focused on team building. The group identified what team building is. Individuals described who is a part of their team. Patients expressed why they thought team building is important. The group stated reasons why they thought it was easier to work with a Dance movement psychotherapist team. Individuals discussed some positives and negatives of working with a team. Patients gave examples of past experiences they had while working with a team. The group participated in the intervention "Save the Phelps", patients were in groups and had to keep the balls on the surface given.  Clinical Observations/Feedback:  Patient came to group and identified teambuilding as working together. He was focused on the topic and what his peers and staff had to say. He participated in the intervention and was positive with staff and peers.  Milos Milligan LRT/CTRS         Ekansh Sherk 11/06/2017 10:15 AM

## 2017-11-06 NOTE — BHH Suicide Risk Assessment (Addendum)
[place] INPATIENT:  Family/Significant Other Suicide Prevention Education  Suicide Prevention Education:  Education Completed; Driscilla Grammes, [phone],  (name of family member/significant other) has been identified by the patient as the family member/significant other with whom the patient will be residing, and identified as the person(s) who will aid the patient in the event of a mental health crisis (suicidal ideations/suicide attempt).  With written consent from the patient, the family member/significant other has been provided the following suicide prevention education, prior to the and/or following the discharge of the patient.  The suicide prevention education provided includes the following:  Suicide risk factors  Suicide prevention and interventions  National Suicide Hotline telephone number  [facility] assessment telephone number  [facility] Emergency Assistance [place] and/or Residential Mobile Crisis Unit telephone number  Request made of family/significant other to:  Remove weapons (e.g., guns, rifles, knives), all items previously/currently identified as safety concern.    Remove drugs/medications (over-the-counter, prescriptions, illicit drugs), all items previously/currently identified as a safety concern.  The family member/significant other verbalizes understanding of the suicide prevention education information provided.  The family member/significant other agrees to remove the items of safety concern listed above.  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  If he had transportation, everything will be good.  I pick up him up for work, but if Im not working, he has to stay in the trailer."  Golf course was his "getaway" and got him out of the house.   Transports to all appointments and social services.  Has  tried to help patient and provide support as needed.  Friend has informed sister of patient's admission and gave her his code.  Sister was worried about patient and didn't know where he was for several days.  Aware that patient has lengthy history of depression, "he'd be fine if he had depression."  Has talked about suicide "once or twice, only when he's been drinking, he doesn't say it when he's sober."  Tries to encourage patient to pay attention to ADLs so he has better chance of getting a job.  Jocko thinks that patient has 2 guns in the home, wants patient to sell him his guns in order to pay off what he owes for utility bills.  "He said they are special to him."  Jocko will buy the guns from patient in order to get them out of the house, "if I buy them he will not get them back."  REviewed aftercare appointments for patient, Dierdre Forth keeps track of his appointments and transports when needed.     Beverely Pace 11/06/2017, 3:10 PM

## 2017-11-06 NOTE — H&P (Addendum)
Psychiatric Admission Assessment Adult  Patient Identification: Ian [name] MRN:  [id number] Date of Evaluation:  11/06/2017 Chief Complaint:  bipolar Principal Diagnosis: Severe recurrent major depression without psychotic features [facility]) Diagnosis:   Patient Active Problem List   Diagnosis Date Noted  . Severe recurrent major depression without psychotic features ([place]) [F33.2] 11/05/2017  . Dyslipidemia [E78.5] 01/07/2017  . Barrett's esophagus [K22.70] 01/07/2017  . Bipolar I disorder, most recent episode depressed with anxious distress ([place]) [F31.9] 01/06/2017  . Cannabis use disorder, moderate, dependence ([place]) [F12.20] 01/06/2017  . Tobacco use disorder [F17.200] 01/06/2017  . Alcohol use disorder, moderate, dependence ([place]) [F10.20] 10/06/2016  . Substance induced mood disorder ([place]) [F19.94] 10/06/2016  . Cocaine use disorder, moderate, dependence ([place]) [F14.20] 10/06/2016  . Depression [F32.9] 10/06/2016  . Acute left-sided weakness [M62.89] 05/03/2015  . CVA (cerebral infarction) [I63.9] 05/03/2015  . Weakness [R53.1] 11/16/2013  . Chest pain [R07.9] 11/16/2013  . HTN (hypertension) [I10] 11/16/2013  . Left-sided weakness [R53.1] 11/16/2013  . Torsades de pointes ([place]) [I47.2] 11/16/2013  . Hemiplegia, unspecified, affecting nondominant side [G81.90] 11/15/2013  . Speech and language deficits [F80.9, R47.9] 11/15/2013   History of Present Illness: 56 yo male with history of depression admitted due to depression and SI. Pt reports a long history of depression but symptoms have been worsening that past 1-2 months. 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 He feels maybe this was a trigger for worsening depression. He stats that he started having more suicidal thoughts the past 2 months. He walked to the river that he last tried to kil himself. He was standing on the edge thinking about jumping in. His  friend saw him and took him to the police to get help. He states that he has been looking for other work at other golf courses but a lot of them are far away. He states that he does not sleep well unless he has Seroquel which is the only effective medication for him. He lives alone in a trailer that he says "is a mess." He did not have heat for awhile but his friend helped him get this turned back on. His friend also helps him get groceries and to his appointments. Pt states that he always thinks negatively and feels hopeless. Pt stated to ED physician that he is drinking daily. However, he minimizes this to me and states that he drinks "once a week but when I drink I drink a lot. I've drinken 15- 20 beers at a time before." He is very vague and changes the subject about alcohol use. He also used cocaine about a week ago. He states that at work they use cocaine and he wants to stay away from that. He states that he has been taking his medications daily but he does not know what he is on. He follows with [facility] and he is upset because h has to see someone different everytime he goes. HE had an appointment yesterday to get some medication changes but he missed his appointment. He reports a lot of anxiety and states, "I just constantly worry about things. Things that are so far in advance and I will worry about it until it happens." He reports feeling on edge all the time and wants help controlling this anxiety.   Associated Signs/Symptoms: Depression Symptoms:  depressed mood, anhedonia, insomnia, fatigue, feelings of worthlessness/guilt, difficulty concentrating, hopelessness, (Hypo) Manic Symptoms:  Denies  Anxiety Symptoms:  Excessive Worry, Panic Symptoms, Social Anxiety, Psychotic Symptoms:  Denies PTSD Symptoms: Negative Total Time spent with patient: 45 minutes  Past Psychiatric History: History of depression,a anxiety and bipolar disorder. He has outpatient follow up at [facility]. He reports  at least 5 inpatient hospitalizations in the past. He reports "4-5" suicide attempts in the past by cutting wrists, jumping in the river, thinking about jumping in front of cars. He has had several medication trials in the past.   Is the patient at risk to self? Yes.    Has the patient been a risk to self in the past 6 months? Yes.    Has the patient been a risk to self within the distant past? Yes.    Is the patient a risk to others? No.  Has the patient been a risk to others in the past 6 months? No.  Has the patient been a risk to others within the distant past? No.  Alcohol Screening: 1. How often do you have a drink containing alcohol?: 2 to 4 times a month 2. How many drinks containing alcohol do you have on a typical day when you are drinking?: 3 or 4 3. How often do you have six or more drinks on one occasion?: Less than monthly AUDIT-C Score: 4 4. How often during the last year have you found that you were not able to stop drinking once you had started?: Less than monthly 5. How often during the last year have you failed to do what was normally expected from you becasue of drinking?: Less than monthly 6. How often during the last year have you needed a first drink in the morning to get yourself going after a heavy drinking session?: Less than monthly 7. How often during the last year have you had a feeling of guilt of remorse after drinking?: Less than monthly 8. How often during the last year have you been unable to remember what happened the night before because you had been drinking?: Less than monthly 9. Have you or someone else been injured as a result of your drinking?: Yes, but not in the last year 10. Has a relative or friend or a doctor or another health worker been concerned about your drinking or suggested you cut down?: Yes, but not in the last year Alcohol Use Disorder Identification Test Final Score (AUDIT): 13 Intervention/Follow-up: Continued Monitoring Substance Abuse  History in the last 12 months:  Yes.  , alcohol use-reported daily in ED, cocaine use Consequences of Substance Abuse: Negative Previous Psychotropic Medications: Yes  Psychological Evaluations: No  Past Medical History:  Past Medical History:  Diagnosis Date  . Anxiety   . Barrett esophagus   . Cancer ([place])   . Coronary artery disease   . Depression   . GERD (gastroesophageal reflux disease)   . Hypertension   . Stroke [facility]) 2014   "mini-stroke" per patient    Past Surgical History:  Procedure Laterality Date  . ANGIOPLASTY    . APPENDECTOMY    . CARDIAC CATHETERIZATION    . CHOLECYSTECTOMY    . WRIST SURGERY Right    age 35   Family History:  Family History  Problem Relation Age of Onset  . Stroke Father   . Leukemia Father   . Breast cancer Sister    Family Psychiatric  History: Unknown Tobacco Screening: Have you used any form of tobacco in the last 30 days? (Cigarettes, Smokeless Tobacco, Cigars, and/or Pipes): Yes Tobacco use,  Select all that apply: 5 or more cigarettes per day Are you interested in Tobacco Cessation Medications?: No, patient refused Counseled patient on smoking cessation including recognizing danger situations, developing coping skills and basic information about quitting provided: Refused/Declined practical counseling Social History: Born and raised in [place] and raised by both parents who are no longer living. He has 2 siblings with not much contact with any of them. He has 3 kids with no contact. 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 He does not have much support but does have a friend who helps him with things. He lives alone in a trailer.   Additional Social History: Marital status: Divorced Divorced, when?: married at age 2, divorced in 49s; no contact w ex wife  What types of issues is patient dealing with in the relationship?: na Are you sexually active?: No What is your sexual orientation?: heterosexual Has  your sexual activity been affected by drugs, alcohol, medication, or emotional stress?: none Does patient have children?: Yes How many children?: 3 How is patient's relationship with their children?: daughter and a set of twins (girl and boy); only has contact w oldest child on holidays, "the twins never lived w me, were too young when we divorced to remember me"; oldest daughter lives in [place] and works for a [employer]                         Allergies:   Allergies  Allergen Reactions  . Fluoxetine Swelling    Anxiety, itching  . Hydrocodone-Acetaminophen Itching   Lab Results:  Results for orders placed or performed during the hospital encounter of 11/05/17 (from the past 48 hour(s))  Lipid panel     Status: Abnormal   Collection Time: 11/06/17  6:54 AM  Result Value Ref Range   Cholesterol 112 0 - 200 mg/dL   Triglycerides 98 <150 mg/dL   HDL 34 (L) >40 mg/dL   Total CHOL/HDL Ratio 3.3 RATIO   VLDL 20 0 - 40 mg/dL   LDL Cholesterol 58 0 - 99 mg/dL    Comment:        Total Cholesterol/HDL:CHD Risk Coronary Heart Disease Risk Table                     Men   Women  1/2 Average Risk   3.4   3.3  Average Risk       5.0   4.4  2 X Average Risk   9.6   7.1  3 X Average Risk  23.4   11.0        Use the calculated Patient Ratio above and the CHD Risk Table to determine the patient's CHD Risk.        ATP III CLASSIFICATION (LDL):  <100     mg/dL   Optimal  100-129  mg/dL   Near or Above                    Optimal  130-159  mg/dL   Borderline  160-189  mg/dL   High  >190     mg/dL   Very High   TSH     Status: None   Collection Time: 11/06/17  6:54 AM  Result Value Ref Range   TSH 2.798 0.350 - 4.500 uIU/mL    Comment: Performed by a 3rd Generation assay with a functional sensitivity of <=0.01 uIU/mL.    Blood  Alcohol level:  Lab Results  Component Value Date   ETH 268 (H) 11/03/2017   ETH 171 (H) 98/92/1194    Metabolic Disorder Labs:  Lab Results   Component Value Date   HGBA1C 5.2 01/07/2017   MPG 103 01/07/2017   MPG 103 11/16/2013   No results found for: PROLACTIN Lab Results  Component Value Date   CHOL 112 11/06/2017   TRIG 98 11/06/2017   HDL 34 (L) 11/06/2017   CHOLHDL 3.3 11/06/2017   VLDL 20 11/06/2017   LDLCALC 58 11/06/2017   LDLCALC 86 01/07/2017    Current Medications: Current Facility-Administered Medications  Medication Dose Route Frequency Provider Last Rate Last Dose  . acetaminophen (TYLENOL) tablet 650 mg  650 mg Oral Q6H PRN [name], [name], MD   650 mg at 11/06/17 0828  . albuterol (PROVENTIL HFA;VENTOLIN HFA) 108 (90 Base) MCG/ACT inhaler 1 puff  1 puff Inhalation Daily PRN Lasheka Kempner, Tyson Babinski, MD      . alum & mag hydroxide-simeth (MAALOX/MYLANTA) 200-200-20 MG/5ML suspension 30 mL  30 mL Oral Q4H PRN [name], [name], MD      . aspirin EC tablet 81 mg  81 mg Oral Daily [name], [name], MD   81 mg at 11/06/17 1740  . atorvastatin (LIPITOR) tablet 40 mg  40 mg Oral q1800 [name], [name], MD   40 mg at 11/05/17 1747  . busPIRone (BUSPAR) tablet 10 mg  10 mg Oral TID [name], [name], MD   10 mg at 11/06/17 1252  . folic acid (FOLVITE) tablet 1 mg  1 mg Oral Daily [name], [name], MD   1 mg at 11/06/17 8144  . Influenza vac split quadrivalent PF (FLUARIX) injection 0.5 mL  0.5 mL Intramuscular Tomorrow-1000 Mistee Soliman R, MD      . lisinopril (PRINIVIL,ZESTRIL) tablet 5 mg  5 mg Oral Daily [name], [name], MD   5 mg at 11/06/17 8185  . LORazepam (ATIVAN) tablet 1 mg  1 mg Oral Q6H PRN [name], [name], MD       Or  . LORazepam (ATIVAN) injection 1 mg  1 mg Intravenous Q6H PRN [name], [name], MD      . magnesium hydroxide (MILK OF MAGNESIA) suspension 30 mL  30 mL Oral Daily PRN [name], [name], MD      . multivitamin with minerals tablet 1 tablet  1 tablet Oral Daily [name], [name], MD   1 tablet at 11/06/17 [phone]  . QUEtiapine (SEROQUEL XR) 24 hr tablet 400 mg  400 mg Oral QHS Kelise Kuch R, MD      .  QUEtiapine (SEROQUEL) tablet 25 mg  25 mg Oral TID PRN Yuko Coventry, Tyson Babinski, MD      . thiamine (VITAMIN B-1) tablet 100 mg  100 mg Oral Daily [name], [name], MD   100 mg at 11/06/17 9702   Or  . thiamine (B-1) injection 100 mg  100 mg Intravenous Daily [name], [name], MD      . [name] ON 11/07/2017] venlafaxine XR (EFFEXOR-XR) 24 hr capsule 75 mg  75 mg Oral Q breakfast Marquie Aderhold, Tyson Babinski, MD       PTA Medications: Medications Prior to Admission  Medication Sig Dispense Refill Last Dose  . aspirin 81 MG EC tablet Take 1 tablet (81 mg total) by mouth daily. 30 tablet 0 Unknown at Unknown  . atorvastatin (LIPITOR) 40 MG tablet Take 1 tablet (40 mg total) by mouth daily at  6 PM. 30 tablet 0 Unknown at Unknown  . buPROPion (WELLBUTRIN XL) 150 MG 24 hr tablet Take 1 tablet (150 mg total) by mouth daily. (Patient not taking: Reported on 11/03/2017) 30 tablet 1 Not Taking at Unknown time  . busPIRone (BUSPAR) 10 MG tablet Take 10 mg 2 (two) times daily by mouth.  2 Unknown at Unknown  . calcium carbonate (TUMS) 500 MG chewable tablet Chew 2 tablets (400 mg of elemental calcium total) by mouth 3 (three) times daily as needed for indigestion or heartburn. (Patient not taking: Reported on 11/03/2017) 90 tablet 0 Not Taking at Unknown time  . lisinopril (PRINIVIL,ZESTRIL) 10 MG tablet Take 1 tablet (10 mg total) by mouth daily. 30 tablet 0 Unknown at Unknown  . Melatonin 5 MG TABS Take 2 tablets (10 mg total) by mouth at bedtime. 30 tablet 0 Unknown at Unknown  . QUEtiapine (SEROQUEL XR) 300 MG 24 hr tablet Take 1 tablet (300 mg total) by mouth at bedtime. (Patient not taking: Reported on 11/03/2017) 30 tablet 1 Not Taking at Unknown time  . QUEtiapine (SEROQUEL XR) 400 MG 24 hr tablet Take 400 mg at bedtime by mouth.   Unknown at Unknown  . QUEtiapine (SEROQUEL) 50 MG tablet Take 1 tablet (50 mg total) by mouth 2 (two) times daily. 60 tablet 1 Unknown at Unknown    Musculoskeletal: Strength & Muscle Tone:  within normal limits Gait & Station: normal Patient leans: N/A  Psychiatric Specialty Exam: Physical Exam  Nursing note and vitals reviewed.   Review of Systems  All other systems reviewed and are negative.   Blood pressure (!) 127/96, pulse 82, temperature 97.9 F (36.6 C), temperature source Oral, resp. rate 16, height 6' (1.829 m), weight 118.4 kg (261 lb), SpO2 99 %.Body mass index is 35.4 kg/m.  General Appearance: Disheveled  Eye Contact:  Good  Speech:  Clear and Coherent  Volume:  Normal  Mood:  Depressed  Affect:  Congruent  Thought Process:  Goal Directed  Orientation:  Full (Time, Place, and Person)  Thought Content:  Negative  Suicidal Thoughts:  Yes.  with intent/plan  Homicidal Thoughts:  No  Memory:  Immediate;   Fair  Judgement:  Fair  Insight:  Fair  Psychomotor Activity:  Normal  Concentration:  Concentration: Fair  Recall:  [employer] of Knowledge:  Fair  Language:  Fair  Akathisia:  No      Assets:  Communication Skills Desire for Improvement Resilience  ADL's:  Intact  Cognition:  WNL  Sleep:  Number of Hours: 7.45    Treatment Plan Summary: 56 yo male with history of depression admitted due to [place]. Pt has had worsening mood and suicidal thoughts over the last month. He has also been drinking excessively although he minimizes this. Pt is tearful and has high anxiety. He would like to get some medication changes to help with anxiety and depression. He has history of torsade de pointes in the past. QTc is wnl at this time. Pt states that Seroquel is the only medication helpful for sleep and anxiety and has been taking this as an outpatient. He is also on Wellbutrin however, he has high anxiety so this likely is not the best choice at this time.   Plan:  Depression/Mood/GAD -Continue home dose of Seroquel XR 400 mg qhs and have prn dosing available for anxiety. Will monitor EKG and QTc. -Start Effexor XR 75 mg daily for anxiety -Start Gabapentin 300  mg TID for  anxiety and alcohol use -Continue Buspar 10 mg TID  Alcohol abuse -Pt on CIWA with Ativan -Will discuss substance treatment options  Dispo/ -Pt will likely discharge back to his trailer.   Observation Level/Precautions:  15 minute checks  Laboratory:  Done in ED  Psychotherapy:    Medications:    Consultations:    Discharge Concerns:    Estimated LOS: 5-7  Other:     Physician Treatment Plan for Primary Diagnosis: Severe recurrent major depression without psychotic features ([place]) Long Term Goal(s): Improvement in symptoms so as ready for discharge  Short Term Goals: Ability to disclose and discuss suicidal ideas  Physician Treatment Plan for Secondary Diagnosis: Active Problems:   Severe recurrent major depression without psychotic features ([place])  Long Term Goal(s): Improvement in symptoms so as ready for discharge  Short Term Goals: Ability to identify changes in lifestyle to reduce recurrence of condition will improve, Ability to verbalize feelings will improve and Ability to disclose and discuss suicidal ideas  I certify that inpatient services furnished can reasonably be expected to improve the patient's condition.    Marylin Crosby, MD 11/16/20182:33 PM

## 2017-11-06 NOTE — [facility] Group Notes (Signed)
[facility] LCSW Group Therapy Note  Date/Time: 11/06/17, 1300  Type of Therapy and Topic:  Group Therapy:  Feelings around Relapse and Recovery  Participation Level:  Active   Mood:pleasant  Description of Group:    Patients in this group will discuss emotions they experience before and after a relapse. They will process how experiencing these feelings, or avoidance of experiencing them, relates to having a relapse. Facilitator will guide patients to explore emotions they have related to recovery. Patients will be encouraged to process which emotions are more powerful. They will be guided to discuss the emotional reaction significant others in their lives may have to patients' relapse or recovery. Patients will be assisted in exploring ways to respond to the emotions of others without this contributing to a relapse.  Therapeutic Goals: 1. Patient will identify two or more emotions that lead to relapse for them:  2. Patient will identify two emotions that result when they relapse:  3. Patient will identify two emotions related to recovery:  4. Patient will demonstrate ability to communicate their needs through discussion and/or role plays.   Summary of Patient Progress:Pt described uncertainty as a feeling that leads him to relapse.  Pt participated in group discussion regarding addiction and finding more positive ways to deal with negative emotions.     Therapeutic Modalities:   Cognitive Behavioral Therapy Solution-Focused Therapy Assertiveness Training Relapse Prevention Therapy  Lurline Idol, LCSW

## 2017-11-06 NOTE — Tx Team (Signed)
Interdisciplinary Treatment and Diagnostic Plan Update  11/06/2017 Time of Session: [place] MRN: [id number]  Principal Diagnosis: <principal problem not specified>  Secondary Diagnoses: Active Problems:   Severe recurrent major depression without psychotic features (HCC)   Current Medications:  Current Facility-Administered Medications  Medication Dose Route Frequency Provider Last Rate Last Dose  . acetaminophen (TYLENOL) tablet 650 mg  650 mg Oral Q6H PRN [name], [name], MD   650 mg at 11/06/17 0828  . albuterol (PROVENTIL HFA;VENTOLIN HFA) 108 (90 Base) MCG/ACT inhaler 1 puff  1 puff Inhalation Daily PRN McNew, Tyson Babinski, MD      . alum & mag hydroxide-simeth (MAALOX/MYLANTA) 200-200-20 MG/5ML suspension 30 mL  30 mL Oral Q4H PRN [name], [name], MD      . aspirin EC tablet 81 mg  81 mg Oral Daily [name], [name], MD   81 mg at 11/06/17 3474  . atorvastatin (LIPITOR) tablet 40 mg  40 mg Oral q1800 [name], [name], MD   40 mg at 11/05/17 1747  . busPIRone (BUSPAR) tablet 10 mg  10 mg Oral TID [name], [name], MD   10 mg at 11/06/17 1252  . folic acid (FOLVITE) tablet 1 mg  1 mg Oral Daily [name], [name], MD   1 mg at 11/06/17 2595  . Influenza vac split quadrivalent PF (FLUARIX) injection 0.5 mL  0.5 mL Intramuscular Tomorrow-1000 McNew, Holly R, MD      . lisinopril (PRINIVIL,ZESTRIL) tablet 5 mg  5 mg Oral Daily [name], [name], MD   5 mg at 11/06/17 6387  . LORazepam (ATIVAN) tablet 1 mg  1 mg Oral Q6H PRN [name], [name], MD       Or  . LORazepam (ATIVAN) injection 1 mg  1 mg Intravenous Q6H PRN [name], [name], MD      . magnesium hydroxide (MILK OF MAGNESIA) suspension 30 mL  30 mL Oral Daily PRN [name], [name], MD      . multivitamin with minerals tablet 1 tablet  1 tablet Oral Daily [name], [name], MD   1 tablet at 11/06/17 [phone]  . QUEtiapine (SEROQUEL XR) 24 hr tablet 400 mg  400 mg Oral QHS McNew, Holly R, MD      . QUEtiapine (SEROQUEL) tablet 25 mg  25 mg  Oral TID PRN McNew, Tyson Babinski, MD      . thiamine (VITAMIN B-1) tablet 100 mg  100 mg Oral Daily [name], [name], MD   100 mg at 11/06/17 3295   Or  . thiamine (B-1) injection 100 mg  100 mg Intravenous Daily [name], [name], MD      . [name] ON 11/07/2017] venlafaxine XR (EFFEXOR-XR) 24 hr capsule 75 mg  75 mg Oral Q breakfast McNew, Tyson Babinski, MD       PTA Medications: Medications Prior to Admission  Medication Sig Dispense Refill Last Dose  . aspirin 81 MG EC tablet Take 1 tablet (81 mg total) by mouth daily. 30 tablet 0 Unknown at Unknown  . atorvastatin (LIPITOR) 40 MG tablet Take 1 tablet (40 mg total) by mouth daily at 6 PM. 30 tablet 0 Unknown at Unknown  . buPROPion (WELLBUTRIN XL) 150 MG 24 hr tablet Take 1 tablet (150 mg total) by mouth daily. (Patient not taking: Reported on 11/03/2017) 30 tablet 1 Not Taking at Unknown time  . busPIRone (BUSPAR) 10 MG tablet Take 10 mg 2 (two) times daily by mouth.  2 Unknown  at Unknown  . calcium carbonate (TUMS) 500 MG chewable tablet Chew 2 tablets (400 mg of elemental calcium total) by mouth 3 (three) times daily as needed for indigestion or heartburn. (Patient not taking: Reported on 11/03/2017) 90 tablet 0 Not Taking at Unknown time  . lisinopril (PRINIVIL,ZESTRIL) 10 MG tablet Take 1 tablet (10 mg total) by mouth daily. 30 tablet 0 Unknown at Unknown  . Melatonin 5 MG TABS Take 2 tablets (10 mg total) by mouth at bedtime. 30 tablet 0 Unknown at Unknown  . QUEtiapine (SEROQUEL XR) 300 MG 24 hr tablet Take 1 tablet (300 mg total) by mouth at bedtime. (Patient not taking: Reported on 11/03/2017) 30 tablet 1 Not Taking at Unknown time  . QUEtiapine (SEROQUEL XR) 400 MG 24 hr tablet Take 400 mg at bedtime by mouth.   Unknown at Unknown  . QUEtiapine (SEROQUEL) 50 MG tablet Take 1 tablet (50 mg total) by mouth 2 (two) times daily. 60 tablet 1 Unknown at Unknown    Patient Stressors: Financial difficulties Health problems Medication change or  noncompliance Substance abuse  Patient Strengths: Ability for insight Average or above average intelligence Communication skills  Treatment Modalities: Medication Management, Group therapy, Case management,  1 to 1 session with clinician, Psychoeducation, Recreational therapy.   Physician Treatment Plan for Primary Diagnosis: <principal problem not specified> Long Term Goal(s):     Short Term Goals:    Medication Management: Evaluate patient's response, side effects, and tolerance of medication regimen.  Therapeutic Interventions: 1 to 1 sessions, Unit Group sessions and Medication administration.  Evaluation of Outcomes: Not Met  Physician Treatment Plan for Secondary Diagnosis: Active Problems:   Severe recurrent major depression without psychotic features ([place])  Long Term Goal(s):     Short Term Goals:       Medication Management: Evaluate patient's response, side effects, and tolerance of medication regimen.  Therapeutic Interventions: 1 to 1 sessions, Unit Group sessions and Medication administration.  Evaluation of Outcomes: Not Met   RN Treatment Plan for Primary Diagnosis: <principal problem not specified> Long Term Goal(s): Knowledge of disease and therapeutic regimen to maintain health will improve  Short Term Goals: Ability to verbalize feelings will improve, Ability to disclose and discuss suicidal ideas, Ability to identify and develop effective coping behaviors will improve and Compliance with prescribed medications will improve  Medication Management: RN will administer medications as ordered by provider, will assess and evaluate patient's response and provide education to patient for prescribed medication. RN will report any adverse and/or side effects to prescribing provider.  Therapeutic Interventions: 1 on 1 counseling sessions, Psychoeducation, Medication administration, Evaluate responses to treatment, Monitor vital signs and CBGs as ordered,  Perform/monitor CIWA, COWS, AIMS and Fall Risk screenings as ordered, Perform wound care treatments as ordered.  Evaluation of Outcomes: Not Met   LCSW Treatment Plan for Primary Diagnosis: <principal problem not specified> Long Term Goal(s): Safe transition to appropriate next level of care at discharge, Engage patient in therapeutic group addressing interpersonal concerns.  Short Term Goals: Engage patient in aftercare planning with referrals and resources, Increase social support and Increase skills for wellness and recovery  Therapeutic Interventions: Assess for all discharge needs, 1 to 1 time with Social worker, Explore available resources and support systems, Assess for adequacy in community support network, Educate family and significant other(s) on suicide prevention, Complete Psychosocial Assessment, Interpersonal group therapy.  Evaluation of Outcomes: Not Met   Progress in Treatment: Attending groups: Yes. Participating in groups: Yes. Taking  medication as prescribed: Yes. Toleration medication: Yes. Family/Significant other contact made: No, will contact:  when given permission Patient understands diagnosis: Yes. Discussing patient identified problems/goals with staff: Yes. Medical problems stabilized or resolved: Yes. Denies suicidal/homicidal ideation: Yes. Issues/concerns per patient self-inventory: No. Other: none  New problem(s) identified: No, Describe:  none  New Short Term/Long Term Goal(s):Pt goal: to find a happy medium with my medication.  Discharge Plan or Barriers: CSW assessing for appropriate plan.  Reason for Continuation of Hospitalization: Depression Medication stabilization  Estimated Length of Stay: 5-7 days.  Recreational Therapy: Patient Stressors: Family, Work Patient Goal: Patient will identify 3 positive coping skills to decrease depressive symptoms x5 days.    Attendees: Patient:Ian [name] 11/06/2017   Physician: Dr Wonda Olds, MD  11/06/2017   Nursing: Tyler Pita, RN 11/06/2017   RN Care Manager: 11/06/2017   Social Worker: Lurline Idol, LCSW 11/06/2017   Recreational Therapist: Roanna Epley, LRT/CTRS 11/06/2017   Other:  11/06/2017   Other:  11/06/2017   Other: 11/06/2017       Scribe for Treatment Team: Joanne Chars, [place] 11/06/2017 2:30 PM

## 2017-11-06 NOTE — Plan of Care (Signed)
Patient slept for Estimated Hours of 7.45; Precautionary checks every 15 minutes for safety maintained, room free of safety hazards, patient sustains no injury or falls during this shift.  

## 2017-11-06 NOTE — Progress Notes (Signed)
Patient ID: Ian [name], male   DOB: 03-Jul-1961, 56 y.o.   MRN: [id number] Poorly kept overall body maintainence, nail beds noted with dark dirt, bushy gray beards, poor body hygiene, "I wanted to kill myself before I came in, but I don't feel like that right now at this moment", patient remarked. No sweats, no tremors, no c/o AVOTG-Hallucinations. Will continue to monitor.

## 2017-11-07 MED ORDER — IBUPROFEN 600 MG PO TABS
600.0000 mg | ORAL_TABLET | Freq: Four times a day (QID) | ORAL | Status: DC | PRN
  Administered 2017-11-07 – 2017-11-15 (×12): 600 mg via ORAL
  Filled 2017-11-07 (×12): qty 1

## 2017-11-07 MED ORDER — LISINOPRIL 5 MG PO TABS
5.0000 mg | ORAL_TABLET | Freq: Once | ORAL | Status: AC
  Administered 2017-11-07: 5 mg via ORAL
  Filled 2017-11-07: qty 1

## 2017-11-07 MED ORDER — HYDROXYZINE HCL 50 MG PO TABS
50.0000 mg | ORAL_TABLET | Freq: Four times a day (QID) | ORAL | Status: DC | PRN
Start: 2017-11-07 — End: 2017-11-16
  Administered 2017-11-07 – 2017-11-15 (×7): 50 mg via ORAL
  Filled 2017-11-07 (×7): qty 1

## 2017-11-07 NOTE — Progress Notes (Addendum)
Pleasant on contact, visible in the dayroom most of the shift. Is social with peers. Denies all psych symptoms. At 2000, BP was 188/107 [place] was 2 for occasional sweating. Ativan 1mg  was given DR Alonna Minium was notified and lisinopril 5mg  po was given at 2100. At 2230 BP was 170/100. Has no symptoms. Will recheck. Remains on routine obs for safety. 0530 116/73 80 16 97.8 O2 100%. Slept well, CIWA 0.

## 2017-11-07 NOTE — Progress Notes (Signed)
[facility] MD Progress Note  11/07/2017 11:54 AM Ian [name]  MRN:  [id number] Subjective:    56 yo male with history of depression admitted due to depression and SI.  I have anxiety, my back is killing me" Pt reports anxiety, back pain, depression but denies SI. Denies withdrawal symptoms. Denies AVH. Med compliant, denies side effects.  Discussed meds, will add Advil and vistaril.   Principal Problem: Severe recurrent major depression without psychotic features ([place]) Diagnosis:   Patient Active Problem List   Diagnosis Date Noted  . Severe recurrent major depression without psychotic features ([name]) [F33.2] 11/05/2017  . Dyslipidemia [E78.5] 01/07/2017  . Barrett's esophagus [K22.70] 01/07/2017  . Bipolar I disorder, most recent episode depressed with anxious distress ([place]) [F31.9] 01/06/2017  . Cannabis use disorder, moderate, dependence ([place]) [F12.20] 01/06/2017  . Tobacco use disorder [F17.200] 01/06/2017  . Alcohol use disorder, moderate, dependence ([place]) [F10.20] 10/06/2016  . Substance induced mood disorder ([place]) [F19.94] 10/06/2016  . Cocaine use disorder, moderate, dependence ([place]) [F14.20] 10/06/2016  . Depression [F32.9] 10/06/2016  . Acute left-sided weakness [M62.89] 05/03/2015  . CVA (cerebral infarction) [I63.9] 05/03/2015  . Weakness [R53.1] 11/16/2013  . Chest pain [R07.9] 11/16/2013  . HTN (hypertension) [I10] 11/16/2013  . Left-sided weakness [R53.1] 11/16/2013  . Torsades de pointes ([place]) [I47.2] 11/16/2013  . Hemiplegia, unspecified, affecting nondominant side [G81.90] 11/15/2013  . Speech and language deficits [F80.9, R47.9] 11/15/2013   Total Time spent with patient: 25 min  Past Psychiatric History: no new info   Past Medical History:  Past Medical History:  Diagnosis Date  . Anxiety   . Barrett esophagus   . Cancer ([place])   . Coronary artery disease   . Depression   . GERD (gastroesophageal reflux disease)   . Hypertension   . Stroke [facility]) 2014   "mini-stroke" per patient    Past Surgical History:  Procedure Laterality Date  . ANGIOPLASTY    . APPENDECTOMY    . CARDIAC CATHETERIZATION    . CHOLECYSTECTOMY    . WRIST SURGERY Right    age 48   Family History:  Family History  Problem Relation Age of Onset  . Stroke Father   . Leukemia Father   . Breast cancer Sister    Family Psychiatric  History: no new info  Social History:  Social History   Substance and Sexual Activity  Alcohol Use Yes   Comment: denies being a daily drinker but did have alcohol today     Social History   Substance and Sexual Activity  Drug Use Yes  . Types: Cocaine, Marijuana   Comment: used cocaine today; denies smoking marijuana for years    Social History   Socioeconomic History  . Marital status: Single    Spouse name: None  . Number of children: None  . Years of education: None  . Highest education level: None  Social Needs  . Financial resource strain: None  . Food insecurity - worry: None  . Food insecurity - inability: None  . Transportation needs - medical: None  . Transportation needs - non-medical: None  Occupational History  . None  Tobacco Use  . Smoking status: Current Every Day Smoker    Packs/day: 1.00    Types: Cigarettes  . Smokeless tobacco: Never Used  Substance and Sexual Activity  . Alcohol use: Yes    Comment: denies being a daily drinker but did have alcohol today  . Drug use: Yes    Types: Cocaine, Marijuana  Comment: used cocaine today; denies smoking marijuana for years  . Sexual activity: None  Other Topics Concern  . None  Social History Narrative  . None   Additional Social History:                         Sleep: Fair  Appetite:  Fair  Current Medications: Current Facility-Administered Medications  Medication Dose Route Frequency Provider Last Rate Last Dose  . acetaminophen (TYLENOL) tablet 650 mg  650 mg Oral Q6H PRN [name], [name], MD   650 mg at 11/06/17 0828  .  albuterol (PROVENTIL HFA;VENTOLIN HFA) 108 (90 Base) MCG/ACT inhaler 1 puff  1 puff Inhalation Daily PRN Marylin Crosby, MD   1 puff at 11/06/17 2134  . alum & mag hydroxide-simeth (MAALOX/MYLANTA) 200-200-20 MG/5ML suspension 30 mL  30 mL Oral Q4H PRN [name], [name], MD      . aspirin EC tablet 81 mg  81 mg Oral Daily [name], [name], MD   81 mg at 11/07/17 0809  . atorvastatin (LIPITOR) tablet 40 mg  40 mg Oral q1800 [name], [name], MD   40 mg at 11/06/17 1642  . busPIRone (BUSPAR) tablet 10 mg  10 mg Oral TID [name], [name], MD   10 mg at 11/07/17 0810  . folic acid (FOLVITE) tablet 1 mg  1 mg Oral Daily [name], [name], MD   1 mg at 11/07/17 0809  . hydrOXYzine (ATARAX/VISTARIL) tablet 50 mg  50 mg Oral Q6H PRN Lenward Chancellor, MD      . ibuprofen (ADVIL,MOTRIN) tablet 600 mg  600 mg Oral Q6H PRN Lenward Chancellor, MD      . Influenza vac split quadrivalent PF (FLUARIX) injection 0.5 mL  0.5 mL Intramuscular Tomorrow-1000 McNew, Holly R, MD      . lisinopril (PRINIVIL,ZESTRIL) tablet 5 mg  5 mg Oral Daily [name], [name], MD   5 mg at 11/07/17 0810  . LORazepam (ATIVAN) tablet 1 mg  1 mg Oral Q6H PRN [name], [name], MD       Or  . LORazepam (ATIVAN) injection 1 mg  1 mg Intravenous Q6H PRN [name], [name], MD      . magnesium hydroxide (MILK OF MAGNESIA) suspension 30 mL  30 mL Oral Daily PRN [name], [name], MD      . multivitamin with minerals tablet 1 tablet  1 tablet Oral Daily [name], [name], MD   1 tablet at 11/07/17 0809  . nicotine (NICODERM CQ - dosed in mg/24 hours) patch 21 mg  21 mg Transdermal Daily McNew, Tyson Babinski, MD   21 mg at 11/07/17 0809  . QUEtiapine (SEROQUEL XR) 24 hr tablet 400 mg  400 mg Oral QHS McNew, Tyson Babinski, MD   400 mg at 11/06/17 2134  . QUEtiapine (SEROQUEL) tablet 25 mg  25 mg Oral TID PRN McNew, Tyson Babinski, MD      . thiamine (VITAMIN B-1) tablet 100 mg  100 mg Oral Daily [name], [name], MD   100 mg at 11/07/17 0809   Or  . thiamine (B-1) injection 100 mg   100 mg Intravenous Daily [name], [name], MD      . venlafaxine XR (EFFEXOR-XR) 24 hr capsule 75 mg  75 mg Oral Q breakfast McNew, Tyson Babinski, MD   75 mg at 11/07/17 8099    Lab Results:  Results for orders placed or performed during the hospital encounter  of 11/05/17 (from the past 48 hour(s))  Hemoglobin A1c     Status: None   Collection Time: 11/06/17  6:54 AM  Result Value Ref Range   Hgb A1c MFr Bld 5.5 4.8 - 5.6 %    Comment: (NOTE) Pre diabetes:          5.7%-6.4% Diabetes:              >6.4% Glycemic control for   <7.0% adults with diabetes    Mean Plasma Glucose 111.15 mg/dL    Comment: Performed at [facility] [address] [place], [place] [address]  Lipid panel     Status: Abnormal   Collection Time: 11/06/17  6:54 AM  Result Value Ref Range   Cholesterol 112 0 - 200 mg/dL   Triglycerides 98 <150 mg/dL   HDL 34 (L) >40 mg/dL   Total CHOL/HDL Ratio 3.3 RATIO   VLDL 20 0 - 40 mg/dL   LDL Cholesterol 58 0 - 99 mg/dL    Comment:        Total Cholesterol/HDL:CHD Risk Coronary Heart Disease Risk Table                     Men   Women  1/2 Average Risk   3.4   3.3  Average Risk       5.0   4.4  2 X Average Risk   9.6   7.1  3 X Average Risk  23.4   11.0        Use the calculated Patient Ratio above and the CHD Risk Table to determine the patient's CHD Risk.        ATP III CLASSIFICATION (LDL):  <100     mg/dL   Optimal  100-129  mg/dL   Near or Above                    Optimal  130-159  mg/dL   Borderline  160-189  mg/dL   High  >190     mg/dL   Very High   TSH     Status: None   Collection Time: 11/06/17  6:54 AM  Result Value Ref Range   TSH 2.798 0.350 - 4.500 uIU/mL    Comment: Performed by a 3rd Generation assay with a functional sensitivity of <=0.01 uIU/mL.    Blood Alcohol level:  Lab Results  Component Value Date   ETH 268 (H) 11/03/2017   ETH 171 (H) 91/47/8295    Metabolic Disorder Labs: Lab Results  Component Value Date   HGBA1C  5.5 11/06/2017   MPG 111.15 11/06/2017   MPG 103 01/07/2017   No results found for: PROLACTIN Lab Results  Component Value Date   CHOL 112 11/06/2017   TRIG 98 11/06/2017   HDL 34 (L) 11/06/2017   CHOLHDL 3.3 11/06/2017   VLDL 20 11/06/2017   LDLCALC 58 11/06/2017   LDLCALC 86 01/07/2017    Physical Findings: AIMS: Facial and Oral Movements Muscles of Facial Expression: None, normal Lips and Perioral Area: None, normal Jaw: None, normal Tongue: None, normal,Extremity Movements Upper (arms, wrists, hands, fingers): None, normal Lower (legs, knees, ankles, toes): None, normal, Trunk Movements Neck, shoulders, hips: None, normal, Overall Severity Severity of abnormal movements (highest score from questions above): None, normal Incapacitation due to abnormal movements: None, normal Patient's awareness of abnormal movements (rate only patient's report): No Awareness, Dental Status Current problems with teeth and/or dentures?: No Does patient  usually wear dentures?: No  CIWA:  CIWA-Ar Total: 0 COWS:     Musculoskeletal: Strength & Muscle Tone: within normal limits Gait & Station: normal Patient leans:   Psychiatric Specialty Exam: Physical Exam  Nursing note and vitals reviewed.   ROS  Blood pressure 130/85, pulse 89, temperature 98.1 F (36.7 C), temperature source Oral, resp. rate 17, height 6' (1.829 m), weight 118.4 kg (261 lb), SpO2 99 %.Body mass index is 35.4 kg/m.    General Appearance: Disheveled  Eye Contact:  Good  Speech:  Clear and Coherent  Volume:  Normal  Mood:  Depressed  Affect:  Congruent  Thought Process:  Goal Directed  Orientation:  Full (Time, Place, and Person)  Thought Content:  Negative  Suicidal Thoughts:  Yes.  with intent/plan  Homicidal Thoughts:  No  Memory:  Immediate;   Fair  Judgement:  Fair  Insight:  Fair  Psychomotor Activity:  Normal  Concentration:  Concentration: Fair  Recall:  [employer] of Knowledge:  Fair  Language:   Fair  Akathisia:  No      Assets:  Communication Skills Desire for Improvement Resilience  ADL's:  Intact  Cognition:  WNL  Sleep:  Number of Hours-5.45       Treatment Plan Summary: Daily contact with patient to assess and evaluate symptoms and progress in treatment and Medication management. Pt depressed and anxious.  Plan:  Depression/Mood/GAD -Continue home dose of Seroquel XR 400 mg qhs and have prn dosing available for anxiety. Will monitor EKG and QTc. -  Effexor XR 75 mg daily for anxiety- just started -Gabapentin 300 mg TID for anxiety and alcohol use -Continue Buspar 10 mg TID Add prn vistaril for anxiety,  Prn advil for back pain.   Alcohol abuse -Pt on CIWA with Ativan -Will discuss substance treatment options  Dispo/ -Pt will likely discharge back to his trailer.   Observation Level/Precautions:  15 minute checks  Laboratory:  Done in ED  Psychotherapy:    Medications:    Consultations:    Discharge Concerns:    Estimated LOS: 5-7  Other:     Physician Treatment Plan for Primary Diagnosis: Severe recurrent major depression without psychotic features ([place]) Long Term Goal(s): Improvement in symptoms so as ready for discharge  Short Term Goals: Ability to disclose and discuss suicidal ideas  Physician Treatment Plan for Secondary Diagnosis: Active Problems:   Severe recurrent major depression without psychotic features ([place])  Long Term Goal(s): Improvement in symptoms so as ready for discharge  Short Term Goals: Ability to identify changes in lifestyle to reduce recurrence of condition will improve, Ability to verbalize feelings will improve and Ability to disclose and discuss suicidal ideas  I certify that inpatient services furnished can reasonably be expected to improve the patient's condition.       Lenward Chancellor, MD 11/07/2017, 11:54 AM

## 2017-11-07 NOTE — Plan of Care (Addendum)
Patient progressing appropriately, Denies SI, HI, AVH. Denies withdrawal symptoms. CIWA =0. Attends group, medication compliant. Reports of chronic back pain and not sleeping well at nights. Md notified.  Appropriate with staff and peers. Encouragement and support offered. Safety checks maintained. Pt receptive and remains safe on unit with q 15 min checks.

## 2017-11-07 NOTE — [facility] Group Notes (Addendum)
11/07/2017 1:15pm  Type of Therapy and Topic:  Group Therapy:  Fears and Unhealthy Coping Skills  Participation Level:  Active   Description of Group:  The focus of this group was to discuss some of the prevalent fears that patients experience, and to identify the commonalities among group members.  An exercise was used to initiate the discussion, followed by writing on the white board a group-generated list of unhealthy coping and healthy coping techniques to deal with each fear.    Therapeutic Goals: 1. Patient will identify and describe 3 fears they experience 2. Patient will identify one positive coping strategy for each fear they experience 3. Patient will respond empathically to peers statements regarding fears they experience  Summary of Patient Progress:  Patient identified himself to be his biggest fear. He indicates that when he does not take his medication and makes bad decisions, he fears for himself. Pt. Reports that getting back on his medications and following through with the doctors order will help him get stabilized and feel better. Pt actively participated in group discussion.       Therapeutic Modalities Cognitive Behavioral Therapy Motivational Interviewing  Ian [name]  [name], Ian 11/07/2017 12:22 PM

## 2017-11-08 MED ORDER — LISINOPRIL 5 MG PO TABS
5.0000 mg | ORAL_TABLET | Freq: Once | ORAL | Status: AC
  Administered 2017-11-08: 5 mg via ORAL
  Filled 2017-11-08: qty 1

## 2017-11-08 NOTE — Progress Notes (Signed)
Denies anxiety tonight, rates depression 5/10. Denies SI/HI or AVH. BP was 154/106 at 2000. Dr Alonna Minium was notified. Lisinopril 5 mg one time dose was ordered and given. Pt denies symptoms of detox tonight. Was med compliant and is in bed at this time. Remains on routine obs for safety.

## 2017-11-08 NOTE — [facility] Group Notes (Signed)
11/08/2017 1:15pm  Type of Therapy and Topic:  Group Therapy:  Healthy Self Image and Positive Change  Participation Level:  Active   Description of Group:  In this group, patients will compare and contrast their current "I am...." statements to the visions they identify as desirable for their lives.  Patients discuss fears and how they can make positive changes in their cognitions that will positively impact their behaviors.  Facilitator played a motivational 3-minute speech and patients were left with the task of thinking about what "I am...." statements they can start using in their lives immediately.  Therapeutic Goals: 1. Patient will state their current self-perception as expressed in an "I Am" statement 2. Patient will contrast this with their desired vision for their live 3. Patient will identify 3 fears that negatively impact their behavior 4. Patient will discuss cognitive distortions that stem from their fears 5. Patient will verbalize statements that challenge their cognitive distortions  Summary of Patient Progress:  Patient stated, "I am glad I made it here". Pt identified  cognitive distortions related to his fears and the challenges to stay positive. Pt actively participated in group.       Therapeutic Modalities Cognitive Behavioral Therapy Motivational Interviewing  Cheree Ditto, LCSW 11/08/2017 12:38 PM

## 2017-11-08 NOTE — Progress Notes (Signed)
[facility] MD Progress Note  11/08/2017 11:27 AM Ian [name]  MRN:  [id number] Subjective:    56 yo male with history of depression admitted due to depression and SI.   Pt reports some  "shakyness" and anxiety, but less depression,  denies SI. Denies. Denies AVH. Med compliant, denies side effects.  CIWA-0, BP better.   Principal Problem: Severe recurrent major depression without psychotic features ([place]) Diagnosis:   Patient Active Problem List   Diagnosis Date Noted  . Severe recurrent major depression without psychotic features ([place]) [F33.2] 11/05/2017  . Dyslipidemia [E78.5] 01/07/2017  . Barrett's esophagus [K22.70] 01/07/2017  . Bipolar I disorder, most recent episode depressed with anxious distress ([place]) [F31.9] 01/06/2017  . Cannabis use disorder, moderate, dependence ([place]) [F12.20] 01/06/2017  . Tobacco use disorder [F17.200] 01/06/2017  . Alcohol use disorder, moderate, dependence ([place]) [F10.20] 10/06/2016  . Substance induced mood disorder ([place]) [F19.94] 10/06/2016  . Cocaine use disorder, moderate, dependence ([place]) [F14.20] 10/06/2016  . Depression [F32.9] 10/06/2016  . Acute left-sided weakness [M62.89] 05/03/2015  . CVA (cerebral infarction) [I63.9] 05/03/2015  . Weakness [R53.1] 11/16/2013  . Chest pain [R07.9] 11/16/2013  . HTN (hypertension) [I10] 11/16/2013  . Left-sided weakness [R53.1] 11/16/2013  . Torsades de pointes ([place]) [I47.2] 11/16/2013  . Hemiplegia, unspecified, affecting nondominant side [G81.90] 11/15/2013  . Speech and language deficits [F80.9, R47.9] 11/15/2013   Total Time spent with patient: 25 min  Past Psychiatric History: no new info   Past Medical History:  Past Medical History:  Diagnosis Date  . Anxiety   . Barrett esophagus   . Cancer ([place])   . Coronary artery disease   . Depression   . GERD (gastroesophageal reflux disease)   . Hypertension   . Stroke [facility]) 2014   "mini-stroke" per patient    Past Surgical History:  Procedure  Laterality Date  . ANGIOPLASTY    . APPENDECTOMY    . CARDIAC CATHETERIZATION    . CHOLECYSTECTOMY    . WRIST SURGERY Right    age 60   Family History:  Family History  Problem Relation Age of Onset  . Stroke Father   . Leukemia Father   . Breast cancer Sister    Family Psychiatric  History: no new info  Social History:  Social History   Substance and Sexual Activity  Alcohol Use Yes   Comment: denies being a daily drinker but did have alcohol today     Social History   Substance and Sexual Activity  Drug Use Yes  . Types: Cocaine, Marijuana   Comment: used cocaine today; denies smoking marijuana for years    Social History   Socioeconomic History  . Marital status: Single    Spouse name: None  . Number of children: None  . Years of education: None  . Highest education level: None  Social Needs  . Financial resource strain: None  . Food insecurity - worry: None  . Food insecurity - inability: None  . Transportation needs - medical: None  . Transportation needs - non-medical: None  Occupational History  . None  Tobacco Use  . Smoking status: Current Every Day Smoker    Packs/day: 1.00    Types: Cigarettes  . Smokeless tobacco: Never Used  Substance and Sexual Activity  . Alcohol use: Yes    Comment: denies being a daily drinker but did have alcohol today  . Drug use: Yes    Types: Cocaine, Marijuana    Comment: used cocaine today; denies  smoking marijuana for years  . Sexual activity: None  Other Topics Concern  . None  Social History Narrative  . None   Additional Social History:                         Sleep: Fair  Appetite:  Fair  Current Medications: Current Facility-Administered Medications  Medication Dose Route Frequency Provider Last Rate Last Dose  . acetaminophen (TYLENOL) tablet 650 mg  650 mg Oral Q6H PRN [name], [name], MD   650 mg at 11/06/17 0828  . albuterol (PROVENTIL HFA;VENTOLIN HFA) 108 (90 Base) MCG/ACT inhaler 1  puff  1 puff Inhalation Daily PRN Marylin Crosby, MD   1 puff at 11/06/17 2134  . alum & mag hydroxide-simeth (MAALOX/MYLANTA) 200-200-20 MG/5ML suspension 30 mL  30 mL Oral Q4H PRN [name], [name], MD      . aspirin EC tablet 81 mg  81 mg Oral Daily [name], [name], MD   81 mg at 11/08/17 1478  . atorvastatin (LIPITOR) tablet 40 mg  40 mg Oral q1800 [name], [name], MD   40 mg at 11/07/17 1708  . busPIRone (BUSPAR) tablet 10 mg  10 mg Oral TID [name], [name], MD   10 mg at 11/08/17 2956  . folic acid (FOLVITE) tablet 1 mg  1 mg Oral Daily [name], [name], MD   1 mg at 11/08/17 2130  . hydrOXYzine (ATARAX/VISTARIL) tablet 50 mg  50 mg Oral Q6H PRN Lenward Chancellor, MD   50 mg at 11/07/17 1709  . ibuprofen (ADVIL,MOTRIN) tablet 600 mg  600 mg Oral Q6H PRN Lenward Chancellor, MD   600 mg at 11/07/17 1221  . Influenza vac split quadrivalent PF (FLUARIX) injection 0.5 mL  0.5 mL Intramuscular Tomorrow-1000 McNew, Holly R, MD      . lisinopril (PRINIVIL,ZESTRIL) tablet 5 mg  5 mg Oral Daily [name], [name], MD   5 mg at 11/08/17 8657  . LORazepam (ATIVAN) tablet 1 mg  1 mg Oral Q6H PRN [name], [name], MD   1 mg at 11/07/17 2019   Or  . LORazepam (ATIVAN) injection 1 mg  1 mg Intravenous Q6H PRN [name], [name], MD      . magnesium hydroxide (MILK OF MAGNESIA) suspension 30 mL  30 mL Oral Daily PRN [name], [name], MD      . multivitamin with minerals tablet 1 tablet  1 tablet Oral Daily [name], [name], MD   1 tablet at 11/08/17 [phone]  . nicotine (NICODERM CQ - dosed in mg/24 hours) patch 21 mg  21 mg Transdermal Daily McNew, Tyson Babinski, MD   21 mg at 11/08/17 6295  . QUEtiapine (SEROQUEL XR) 24 hr tablet 400 mg  400 mg Oral QHS McNew, Tyson Babinski, MD   400 mg at 11/07/17 2019  . QUEtiapine (SEROQUEL) tablet 25 mg  25 mg Oral TID PRN McNew, Tyson Babinski, MD      . thiamine (VITAMIN B-1) tablet 100 mg  100 mg Oral Daily [name], [name], MD   100 mg at 11/08/17 2841   Or  . thiamine (B-1) injection 100 mg  100 mg  Intravenous Daily [name], [name], MD      . venlafaxine XR (EFFEXOR-XR) 24 hr capsule 75 mg  75 mg Oral Q breakfast McNew, Tyson Babinski, MD   75 mg at 11/08/17 3244    Lab Results:  No results found for this or  any previous visit (from the past 48 hour(s)).  Blood Alcohol level:  Lab Results  Component Value Date   ETH 268 (H) 11/03/2017   ETH 171 (H) 64/33/2951    Metabolic Disorder Labs: Lab Results  Component Value Date   HGBA1C 5.5 11/06/2017   MPG 111.15 11/06/2017   MPG 103 01/07/2017   No results found for: PROLACTIN Lab Results  Component Value Date   CHOL 112 11/06/2017   TRIG 98 11/06/2017   HDL 34 (L) 11/06/2017   CHOLHDL 3.3 11/06/2017   VLDL 20 11/06/2017   LDLCALC 58 11/06/2017   LDLCALC 86 01/07/2017    Physical Findings: AIMS: Facial and Oral Movements Muscles of Facial Expression: None, normal Lips and Perioral Area: None, normal Jaw: None, normal Tongue: None, normal,Extremity Movements Upper (arms, wrists, hands, fingers): None, normal Lower (legs, knees, ankles, toes): None, normal, Trunk Movements Neck, shoulders, hips: None, normal, Overall Severity Severity of abnormal movements (highest score from questions above): None, normal Incapacitation due to abnormal movements: None, normal Patient's awareness of abnormal movements (rate only patient's report): No Awareness, Dental Status Current problems with teeth and/or dentures?: No Does patient usually wear dentures?: No  CIWA:  CIWA-Ar Total: 0 COWS:     Musculoskeletal: Strength & Muscle Tone: within normal limits Gait & Station: normal Patient leans:   Psychiatric Specialty Exam: Physical Exam  Nursing note and vitals reviewed.   ROS  Blood pressure 116/73, pulse 80, temperature 97.8 F (36.6 C), resp. rate 18, height 6' (1.829 m), weight 118.4 kg (261 lb), SpO2 96 %.Body mass index is 35.4 kg/m.    General Appearance: Disheveled  Eye Contact:  Good  Speech:  Clear and Coherent   Volume:  Normal  Mood:  Depressed  Affect:  Congruent  Thought Process:  Goal Directed  Orientation:  Full (Time, Place, and Person)  Thought Content:  Negative  Suicidal Thoughts:  Yes.  with intent/plan  Homicidal Thoughts:  No  Memory:  Immediate;   Fair  Judgement:  Fair  Insight:  Fair  Psychomotor Activity:  Normal  Concentration:  Concentration: Fair  Recall:  [employer] of Knowledge:  Fair  Language:  Fair  Akathisia:  No      Assets:  Communication Skills Desire for Improvement Resilience  ADL's:  Intact  Cognition:  WNL  Sleep:  Number of Hours-5.45       Treatment Plan Summary: Daily contact with patient to assess and evaluate symptoms and progress in treatment and Medication management. Pt depressed and anxious.  Plan:  Depression/Mood/GAD -Continue home dose of Seroquel XR 400 mg qhs and have prn dosing available for anxiety. Will monitor EKG and QTc. -  Effexor XR 75 mg daily for anxiety- just started -Gabapentin 300 mg TID for anxiety and alcohol use -Continue Buspar 10 mg TID prn vistaril for anxiety,  advil for back pain.   Alcohol abuse -Pt on CIWA with Ativan -Will discuss substance treatment options  Dispo/ -Pt will likely discharge back to his trailer.   Observation Level/Precautions:  15 minute checks  Laboratory:  Done in ED  Psychotherapy:    Medications:    Consultations:    Discharge Concerns:    Estimated LOS: 5-7  Other:     Physician Treatment Plan for Primary Diagnosis: Severe recurrent major depression without psychotic features ([place]) Long Term Goal(s): Improvement in symptoms so as ready for discharge  Short Term Goals: Ability to disclose and discuss suicidal ideas  Physician Treatment Plan  for Secondary Diagnosis: Active Problems:   Severe recurrent major depression without psychotic features ([place])  Long Term Goal(s): Improvement in symptoms so as ready for discharge  Short Term Goals: Ability to  identify changes in lifestyle to reduce recurrence of condition will improve, Ability to verbalize feelings will improve and Ability to disclose and discuss suicidal ideas  I certify that inpatient services furnished can reasonably be expected to improve the patient's condition.       Lenward Chancellor, MD 11/08/2017, 11:27 AMPatient ID: Ian [name], male   DOB: 22-Dec-1961, 56 y.o.   MRN: [id number]

## 2017-11-08 NOTE — Plan of Care (Signed)
Patient stated that little things make him anxious and tried to use coping skills.But that elevated his blood pressure and PRN given.Patient is pleasant and cooperative on approach.Visible in the milieu and interacting with peers.Denies SI,HI and AVH.Compliant with medications.Attended groups.Appetite and energy level good.Support and encouragement given.

## 2017-11-09 MED ORDER — CLONIDINE HCL 0.1 MG PO TABS
0.1000 mg | ORAL_TABLET | Freq: Four times a day (QID) | ORAL | Status: DC | PRN
  Administered 2017-11-11 – 2017-11-14 (×3): 0.1 mg via ORAL
  Filled 2017-11-09 (×3): qty 1

## 2017-11-09 MED ORDER — CLONIDINE HCL 0.1 MG PO TABS
0.1000 mg | ORAL_TABLET | Freq: Once | ORAL | Status: AC
  Administered 2017-11-09: 0.1 mg via ORAL
  Filled 2017-11-09: qty 1

## 2017-11-09 MED ORDER — MIRTAZAPINE 15 MG PO TABS
15.0000 mg | ORAL_TABLET | Freq: Every day | ORAL | Status: DC
  Administered 2017-11-10 – 2017-11-14 (×5): 15 mg via ORAL
  Filled 2017-11-09 (×5): qty 1

## 2017-11-09 MED ORDER — GABAPENTIN 600 MG PO TABS
300.0000 mg | ORAL_TABLET | Freq: Three times a day (TID) | ORAL | Status: DC
  Administered 2017-11-09 – 2017-11-16 (×20): 300 mg via ORAL
  Filled 2017-11-09 (×21): qty 1

## 2017-11-09 NOTE — Progress Notes (Signed)
[facility] MD Progress Note  11/09/2017 4:16 PM Ian [name]  MRN:  [id number]   Subjective:  Pt states that he is feeling slightly better. His BP has been elevated and he is worried about this. He states that he was having a headache earlier. He states that anxiety was better on Friday but there has been a lot of commotion on the unit which made his anxiety worse. He is still feeling depressed and having suicidial thoughts that "come and go." He states that he is not persistently depressed which is an improvement. He has been going to group and has been helpful to hear other people's stories. He reports a long history of depression and feels lonely at home and does not have much of a family. He states that he drinks and then gets more depressed. He states that the holidays are very hard on him ever since he lost his mother. He states that he has 2 guns at home and states, "My sister probably removed them by now." We discussed some medication changes today and he is open tot his. 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   Principal Problem: Severe recurrent major depression without psychotic features ([place]) Diagnosis:   Patient Active Problem List   Diagnosis Date Noted  . Severe recurrent major depression without psychotic features ([place]) [F33.2] 11/05/2017  . Dyslipidemia [E78.5] 01/07/2017  . Barrett's esophagus [K22.70] 01/07/2017  . Bipolar I disorder, most recent episode depressed with anxious distress ([place]) [F31.9] 01/06/2017  . Cannabis use disorder, moderate, dependence ([place]) [F12.20] 01/06/2017  . Tobacco use disorder [F17.200] 01/06/2017  . Alcohol use disorder, moderate, dependence ([place]) [F10.20] 10/06/2016  . Substance induced mood disorder ([place]) [F19.94] 10/06/2016  . Cocaine use disorder, moderate, dependence ([place]) [F14.20] 10/06/2016  . Depression [F32.9] 10/06/2016  . Acute left-sided weakness [M62.89] 05/03/2015  . CVA (cerebral  infarction) [I63.9] 05/03/2015  . Weakness [R53.1] 11/16/2013  . Chest pain [R07.9] 11/16/2013  . HTN (hypertension) [I10] 11/16/2013  . Left-sided weakness [R53.1] 11/16/2013  . Torsades de pointes ([place]) [I47.2] 11/16/2013  . Hemiplegia, unspecified, affecting nondominant side [G81.90] 11/15/2013  . Speech and language deficits [F80.9, R47.9] 11/15/2013   Total Time spent with patient: 20 minutes  Past Psychiatric History: See H&P  Past Medical History:  Past Medical History:  Diagnosis Date  . Anxiety   . Barrett esophagus   . Cancer ([place])   . Coronary artery disease   . Depression   . GERD (gastroesophageal reflux disease)   . Hypertension   . Stroke [facility]) 2014   "mini-stroke" per patient    Past Surgical History:  Procedure Laterality Date  . ANGIOPLASTY    . APPENDECTOMY    . CARDIAC CATHETERIZATION    . CHOLECYSTECTOMY    . WRIST SURGERY Right    age 79   Family History:  Family History  Problem Relation Age of Onset  . Stroke Father   . Leukemia Father   . Breast cancer Sister    Family Psychiatric  History: See H&P Social History:  Social History   Substance and Sexual Activity  Alcohol Use Yes   Comment: denies being a daily drinker but did have alcohol today     Social History   Substance and Sexual Activity  Drug Use Yes  . Types: Cocaine, Marijuana   Comment: used cocaine today; denies smoking marijuana for years    Social History   Socioeconomic History  .  Marital status: Single    Spouse name: None  . Number of children: None  . Years of education: None  . Highest education level: None  Social Needs  . Financial resource strain: None  . Food insecurity - worry: None  . Food insecurity - inability: None  . Transportation needs - medical: None  . Transportation needs - non-medical: None  Occupational History  . None  Tobacco Use  . Smoking status: Current Every Day Smoker    Packs/day: 1.00    Types: Cigarettes  . Smokeless  tobacco: Never Used  Substance and Sexual Activity  . Alcohol use: Yes    Comment: denies being a daily drinker but did have alcohol today  . Drug use: Yes    Types: Cocaine, Marijuana    Comment: used cocaine today; denies smoking marijuana for years  . Sexual activity: None  Other Topics Concern  . None  Social History Narrative  . None   Additional Social History:                         Sleep: Good  Appetite:  Good  Current Medications: Current Facility-Administered Medications  Medication Dose Route Frequency Provider Last Rate Last Dose  . acetaminophen (TYLENOL) tablet 650 mg  650 mg Oral Q6H PRN [name], [name], MD   650 mg at 11/06/17 0828  . albuterol (PROVENTIL HFA;VENTOLIN HFA) 108 (90 Base) MCG/ACT inhaler 1 puff  1 puff Inhalation Daily PRN Zigmond Trela, Tyson Babinski, MD   1 puff at 11/08/17 1437  . alum & mag hydroxide-simeth (MAALOX/MYLANTA) 200-200-20 MG/5ML suspension 30 mL  30 mL Oral Q4H PRN [name], [name], MD      . aspirin EC tablet 81 mg  81 mg Oral Daily [name], [name], MD   81 mg at 11/09/17 0911  . atorvastatin (LIPITOR) tablet 40 mg  40 mg Oral q1800 [name], [name], MD   40 mg at 11/08/17 1634  . busPIRone (BUSPAR) tablet 10 mg  10 mg Oral TID [name], [name], MD   10 mg at 11/09/17 1222  . cloNIDine (CATAPRES) tablet 0.1 mg  0.1 mg Oral Q6H PRN Geoffrey Mankin, Tyson Babinski, MD      . folic acid (FOLVITE) tablet 1 mg  1 mg Oral Daily [name], [name], MD   1 mg at 11/09/17 0824  . gabapentin (NEURONTIN) tablet 300 mg  300 mg Oral TID Jones Viviani, Tyson Babinski, MD      . hydrOXYzine (ATARAX/VISTARIL) tablet 50 mg  50 mg Oral Q6H PRN Lenward Chancellor, MD   50 mg at 11/08/17 1442  . ibuprofen (ADVIL,MOTRIN) tablet 600 mg  600 mg Oral Q6H PRN Lenward Chancellor, MD   600 mg at 11/09/17 1222  . Influenza vac split quadrivalent PF (FLUARIX) injection 0.5 mL  0.5 mL Intramuscular Tomorrow-1000 Caylea Foronda R, MD      . lisinopril (PRINIVIL,ZESTRIL) tablet 5 mg  5 mg Oral Daily [name],  [name], MD   5 mg at 11/09/17 0911  . magnesium hydroxide (MILK OF MAGNESIA) suspension 30 mL  30 mL Oral Daily PRN [name], [name], MD      . [name] ON 11/10/2017] mirtazapine (REMERON) tablet 15 mg  15 mg Oral QHS Laquandra Carrillo R, MD      . multivitamin with minerals tablet 1 tablet  1 tablet Oral Daily [name], [name], MD   1 tablet at 11/09/17 [phone]  . nicotine (NICODERM CQ -  dosed in mg/24 hours) patch 21 mg  21 mg Transdermal Daily Marykatherine Sherwood, Tyson Babinski, MD   21 mg at 11/09/17 0828  . QUEtiapine (SEROQUEL XR) 24 hr tablet 400 mg  400 mg Oral QHS Jesselle Laflamme, Tyson Babinski, MD   400 mg at 11/08/17 2122  . QUEtiapine (SEROQUEL) tablet 25 mg  25 mg Oral TID PRN Ilo Beamon, Tyson Babinski, MD      . thiamine (VITAMIN B-1) tablet 100 mg  100 mg Oral Daily [name], [name], MD   100 mg at 11/09/17 5284   Or  . thiamine (B-1) injection 100 mg  100 mg Intravenous Daily [name], [name], MD        Lab Results: No results found for this or any previous visit (from the past 48 hour(s)).  Blood Alcohol level:  Lab Results  Component Value Date   ETH 268 (H) 11/03/2017   ETH 171 (H) 13/24/4010    Metabolic Disorder Labs: Lab Results  Component Value Date   HGBA1C 5.5 11/06/2017   MPG 111.15 11/06/2017   MPG 103 01/07/2017   No results found for: PROLACTIN Lab Results  Component Value Date   CHOL 112 11/06/2017   TRIG 98 11/06/2017   HDL 34 (L) 11/06/2017   CHOLHDL 3.3 11/06/2017   VLDL 20 11/06/2017   LDLCALC 58 11/06/2017   LDLCALC 86 01/07/2017    Physical Findings: AIMS: Facial and Oral Movements Muscles of Facial Expression: None, normal Lips and Perioral Area: None, normal Jaw: None, normal Tongue: None, normal,Extremity Movements Upper (arms, wrists, hands, fingers): None, normal Lower (legs, knees, ankles, toes): None, normal, Trunk Movements Neck, shoulders, hips: None, normal, Overall Severity Severity of abnormal movements (highest score from questions above): None, normal Incapacitation due to  abnormal movements: None, normal Patient's awareness of abnormal movements (rate only patient's report): No Awareness, Dental Status Current problems with teeth and/or dentures?: No Does patient usually wear dentures?: No  CIWA:  CIWA-Ar Total: 0 COWS:     Musculoskeletal: Strength & Muscle Tone: within normal limits Gait & Station: normal Patient leans: N/A  Psychiatric Specialty Exam: Physical Exam  ROS  Blood pressure (!) 159/115, pulse 88, temperature 97.8 F (36.6 C), resp. rate 18, height 6' (1.829 m), weight 118.4 kg (261 lb), SpO2 96 %.Body mass index is 35.4 kg/m.  General Appearance: Casual  Eye Contact:  Good  Speech:  Clear and Coherent  Volume:  Normal  Mood:  Depressed  Affect:  Congruent  Thought Process:  Goal Directed  Orientation:  Full (Time, Place, and Person)  Thought Content:  Negative  Suicidal Thoughts:  Yes.  without intent/plan  Homicidal Thoughts:  No  Memory:  Immediate;   Fair  Judgement:  Fair  Insight:  Fair  Psychomotor Activity:  Normal  Concentration:  Concentration: Fair  Recall:  [employer] of Knowledge:  Fair  Language:  Fair  Akathisia:  No      Assets:  Communication Skills Desire for Improvement Resilience  ADL's:  Intact  Cognition:  WNL  Sleep:  Number of Hours: 6.3     Treatment Plan Summary: 56 yo male with history of depression and alcohol use admitted due to worsening depression and SI. Pt reports some improvement of mood and anxiety. His BP has been elevated and pt has headache. This may be due to Effexor. Will need to monitor this closely. Will make some medication adjustments to target anxiety and mood.  Plan:  MDD -Stop Effexor XR due to  elevated bp -Start Remeron 15 mg qhs -Continue Seroquel XL 400 mg qhs  Anxiety -Buspar 10 mg TID -Start Gabapentin 300 mg TID -prn Seroquel and hydroxyzine   HTN -Clonidine prn -Continue Lisinopril  Dispo -Pt will return to his trailer on discharge. He follows with  Lindon Romp, MD 11/09/2017, 4:16 PM

## 2017-11-09 NOTE — [facility] Group Notes (Signed)
11/09/2017 0930  Type of Therapy and Topic:  Group Therapy:  Overcoming Obstacles   Participation Level:  Did Not Attend   Description of Group:   In this group patients will be encouraged to explore what they see as obstacles to their own wellness and recovery. They will be guided to discuss their thoughts, feelings, and behaviors related to these obstacles. The group will process together ways to cope with barriers, with attention given to specific choices patients can make. Each patient will be challenged to identify changes they are motivated to make in order to overcome their obstacles. This group will be process-oriented, with patients participating in exploration of their own experiences, giving and receiving support, and processing challenge from other group members.   Therapeutic Goals: 1. Patient will identify personal and current obstacles as they relate to admission. 2. Patient will identify barriers that currently interfere with their wellness or overcoming obstacles.  3. Patient will identify feelings, thought process and behaviors related to these barriers. 4. Patient will identify two changes they are willing to make to overcome these obstacles:      Summary of Patient Progress  Pt did not attend.    Therapeutic Modalities:   Cognitive Behavioral Therapy Solution Focused Therapy Motivational Interviewing Relapse Prevention Therapy  Alden Hipp, MSW, LCSW 11/09/2017 12:46 PM

## 2017-11-09 NOTE — [facility] Group Notes (Signed)
[place] Group Notes:  (Nursing/MHT/Case Management/Adjunct)  Date:  11/09/2017  Time:  3:24 PM  Type of Therapy:  Psychoeducational Skills  Participation Level:  Did Not Attend    Drake Leach 11/09/2017, 3:24 PM

## 2017-11-09 NOTE — Progress Notes (Addendum)
Recreation Therapy Notes  Date: 11.19.18  Time: 1:00pm  Location: Craft Room  Behavioral response: Focused  Intervention Topic: Coping Skills  Discussion/Intervention: Group content on today was focused on coping skills. The group defined what coping skills are and when they can be used. Individuals described how they normally cope with thing and the coping skills they normally use. Patients expressed why it is important to cope with things and how not coping with things can affect you. The group participated in the intervention "My coping box" and made coping boxes while adding coping skills they could use in the future to the box. Clinical Observations/Feedback:  Patient came to group and was focused on the topic at hand. He participated in the intervention and was social with peers and staff.   Shyloh Krinke LRT/CTRS         Alayja Armas 11/09/2017 2:29 PM

## 2017-11-09 NOTE — [facility] Group Notes (Signed)
[facility] Group Notes:  (Nursing/MHT/Case Management/Adjunct)  Date:  11/09/2017  Time:  2:20 AM  Type of Therapy:  Psychoeducational Skills  Participation Level:  None   Joretta Bachelor 11/09/2017, 2:20 AM

## 2017-11-09 NOTE — Progress Notes (Signed)
D: Pt denies SI/HI/AVH. Pt is pleasant and cooperative. Patient has no physical complaints at this time. Patient is in milieu, interacting with peers, and watching TV.   A: Q x 15 minute observation checks were completed for safety, on moderate fall risk precations. Patient was provided with education on medications. Patient was offered support and encouragement. Patient was given scheduled medications and PRN. Patient  was encourage to attend groups, participate in unit activities and continue with plan of care.   R: Patient is complaint with medication. Patient has no complaints at this time. Patient  receptive to treatment and safety maintained on unit.

## 2017-11-09 NOTE — [facility] Group Notes (Signed)
[facility] Group Notes:  (Nursing/MHT/Case Management/Adjunct)  Date:  11/09/2017  Time:  11:30 PM  Type of Therapy:  Evening Wrap-up Group  Participation Level:  Active  Participation Quality:  Appropriate and Attentive  Affect:  Appropriate  Cognitive:  Alert and Appropriate  Insight:  Improving  Engagement in Group:  Developing/Improving and Engaged  Modes of Intervention:  Discussion  Summary of Progress/Problems:  Levonne Spiller 11/09/2017, 11:30 PM

## 2017-11-09 NOTE — Plan of Care (Signed)
  Progressing Coping: Ability to cope will improve 11/09/2017 2208 - Progressing by Alyson Locket I, RN Ability to verbalize feelings will improve 11/09/2017 2208 - Progressing by Anson [place], RN Education: Emotional status will improve 11/09/2017 2208 - Progressing by Alyson Locket I, RN Mental status will improve 11/09/2017 2208 - Progressing by Alyson Locket I, RN Self-Concept: Ability to disclose and discuss suicidal ideas will improve 11/09/2017 2208 - Progressing by Alyson Locket I, RN Ability to verbalize positive feelings about self will improve 11/09/2017 2208 - Progressing by Alyson Locket I, RN Activity: Sleeping patterns will improve 11/09/2017 2208 - Progressing by Anson [place], RN

## 2017-11-10 MED ORDER — QUETIAPINE FUMARATE ER 300 MG PO TB24
300.0000 mg | ORAL_TABLET | Freq: Every day | ORAL | Status: DC
  Administered 2017-11-10 – 2017-11-15 (×6): 300 mg via ORAL
  Filled 2017-11-10 (×6): qty 1

## 2017-11-10 NOTE — Progress Notes (Signed)
[facility] MD Progress Note  11/10/2017 10:17 AM Ian [name]  MRN:  [id number]   Subjective:  Pt states that he is having a better day today. His BP is much better this morning which is a relief for him. He states that he slept "very good!" last night. His mood is improving today. He was feeling anxious yesterday but so far this morning anxiety is under good control. HE was having vague SI yesterday but no SI so far today. Affect appears brighter today. He did meet with Lanae Boast with [facility] and had a really good meeting with him and plans to continue to meet him. We discussed today about this risks of his medications with history of torsade de pointes. He states that he is aware that Seroquel can be a risk for this and has been discussing this with his outpatient psychiatrist. He wants to continue this but is willing to decrease dose slightly since he is on Remeron now, as well.   Principal Problem: Severe recurrent major depression without psychotic features ([place]) Diagnosis:   Patient Active Problem List   Diagnosis Date Noted  . Severe recurrent major depression without psychotic features ([place]) [F33.2] 11/05/2017  . Dyslipidemia [E78.5] 01/07/2017  . Barrett's esophagus [K22.70] 01/07/2017  . Bipolar I disorder, most recent episode depressed with anxious distress ([place]) [F31.9] 01/06/2017  . Cannabis use disorder, moderate, dependence ([place]) [F12.20] 01/06/2017  . Tobacco use disorder [F17.200] 01/06/2017  . Alcohol use disorder, moderate, dependence ([place]) [F10.20] 10/06/2016  . Substance induced mood disorder ([place]) [F19.94] 10/06/2016  . Cocaine use disorder, moderate, dependence ([place]) [F14.20] 10/06/2016  . Depression [F32.9] 10/06/2016  . Acute left-sided weakness [M62.89] 05/03/2015  . CVA (cerebral infarction) [I63.9] 05/03/2015  . Weakness [R53.1] 11/16/2013  . Chest pain [R07.9] 11/16/2013  . HTN (hypertension) [I10] 11/16/2013  . Left-sided weakness [R53.1] 11/16/2013  . Torsades de  pointes ([place]) [I47.2] 11/16/2013  . Hemiplegia, unspecified, affecting nondominant side [G81.90] 11/15/2013  . Speech and language deficits [F80.9, R47.9] 11/15/2013   Total Time spent with patient: 20 minutes  Past Psychiatric History: See H&P  Past Medical History:  Past Medical History:  Diagnosis Date  . Anxiety   . Barrett esophagus   . Cancer ([place])   . Coronary artery disease   . Depression   . GERD (gastroesophageal reflux disease)   . Hypertension   . Stroke [facility]) 2014   "mini-stroke" per patient    Past Surgical History:  Procedure Laterality Date  . ANGIOPLASTY    . APPENDECTOMY    . CARDIAC CATHETERIZATION    . CHOLECYSTECTOMY    . WRIST SURGERY Right    age 39   Family History:  Family History  Problem Relation Age of Onset  . Stroke Father   . Leukemia Father   . Breast cancer Sister    Family Psychiatric  History: See H&P Social History:  Social History   Substance and Sexual Activity  Alcohol Use Yes   Comment: denies being a daily drinker but did have alcohol today     Social History   Substance and Sexual Activity  Drug Use Yes  . Types: Cocaine, Marijuana   Comment: used cocaine today; denies smoking marijuana for years    Social History   Socioeconomic History  . Marital status: Single    Spouse name: None  . Number of children: None  . Years of education: None  . Highest education level: None  Social Needs  . Financial resource strain: None  .  Food insecurity - worry: None  . Food insecurity - inability: None  . Transportation needs - medical: None  . Transportation needs - non-medical: None  Occupational History  . None  Tobacco Use  . Smoking status: Current Every Day Smoker    Packs/day: 1.00    Types: Cigarettes  . Smokeless tobacco: Never Used  Substance and Sexual Activity  . Alcohol use: Yes    Comment: denies being a daily drinker but did have alcohol today  . Drug use: Yes    Types: Cocaine, Marijuana     Comment: used cocaine today; denies smoking marijuana for years  . Sexual activity: None  Other Topics Concern  . None  Social History Narrative  . None   Additional Social History:                         Sleep: Good  Appetite:  Good  Current Medications: Current Facility-Administered Medications  Medication Dose Route Frequency Provider Last Rate Last Dose  . acetaminophen (TYLENOL) tablet 650 mg  650 mg Oral Q6H PRN [name], [name], MD   650 mg at 11/06/17 0828  . albuterol (PROVENTIL HFA;VENTOLIN HFA) 108 (90 Base) MCG/ACT inhaler 1 puff  1 puff Inhalation Daily PRN Marylin Crosby, MD   1 puff at 11/10/17 0828  . alum & mag hydroxide-simeth (MAALOX/MYLANTA) 200-200-20 MG/5ML suspension 30 mL  30 mL Oral Q4H PRN [name], [name], MD      . aspirin EC tablet 81 mg  81 mg Oral Daily [name], [name], MD   81 mg at 11/10/17 0825  . atorvastatin (LIPITOR) tablet 40 mg  40 mg Oral q1800 [name], [name], MD   40 mg at 11/09/17 1747  . busPIRone (BUSPAR) tablet 10 mg  10 mg Oral TID [name], [name], MD   10 mg at 11/10/17 0825  . cloNIDine (CATAPRES) tablet 0.1 mg  0.1 mg Oral Q6H PRN Sherelle Castelli, Tyson Babinski, MD      . folic acid (FOLVITE) tablet 1 mg  1 mg Oral Daily [name], [name], MD   1 mg at 11/10/17 0825  . gabapentin (NEURONTIN) tablet 300 mg  300 mg Oral TID Marylin Crosby, MD   300 mg at 11/10/17 0824  . hydrOXYzine (ATARAX/VISTARIL) tablet 50 mg  50 mg Oral Q6H PRN Lenward Chancellor, MD   50 mg at 11/09/17 2134  . ibuprofen (ADVIL,MOTRIN) tablet 600 mg  600 mg Oral Q6H PRN Lenward Chancellor, MD   600 mg at 11/09/17 1222  . Influenza vac split quadrivalent PF (FLUARIX) injection 0.5 mL  0.5 mL Intramuscular Tomorrow-1000 Athel Merriweather R, MD      . lisinopril (PRINIVIL,ZESTRIL) tablet 5 mg  5 mg Oral Daily [name], [name], MD   5 mg at 11/10/17 0824  . magnesium hydroxide (MILK OF MAGNESIA) suspension 30 mL  30 mL Oral Daily PRN [name], [name], MD      . mirtazapine (REMERON)  tablet 15 mg  15 mg Oral QHS Delrae Hagey R, MD      . multivitamin with minerals tablet 1 tablet  1 tablet Oral Daily [name], [name], MD   1 tablet at 11/10/17 [phone]  . nicotine (NICODERM CQ - dosed in mg/24 hours) patch 21 mg  21 mg Transdermal Daily Reinhold Rickey, Tyson Babinski, MD   21 mg at 11/10/17 0827  . QUEtiapine (SEROQUEL XR) 24 hr tablet 300 mg  300 mg Oral QHS  Aalyah Mansouri, Tyson Babinski, MD      . QUEtiapine (SEROQUEL) tablet 25 mg  25 mg Oral TID PRN Raheen Capili, Tyson Babinski, MD      . thiamine (VITAMIN B-1) tablet 100 mg  100 mg Oral Daily [name], [name], MD   100 mg at 11/10/17 6759   Or  . thiamine (B-1) injection 100 mg  100 mg Intravenous Daily [name], [name], MD        Lab Results: No results found for this or any previous visit (from the past 48 hour(s)).  Blood Alcohol level:  Lab Results  Component Value Date   ETH 268 (H) 11/03/2017   ETH 171 (H) 16/38/4665    Metabolic Disorder Labs: Lab Results  Component Value Date   HGBA1C 5.5 11/06/2017   MPG 111.15 11/06/2017   MPG 103 01/07/2017   No results found for: PROLACTIN Lab Results  Component Value Date   CHOL 112 11/06/2017   TRIG 98 11/06/2017   HDL 34 (L) 11/06/2017   CHOLHDL 3.3 11/06/2017   VLDL 20 11/06/2017   LDLCALC 58 11/06/2017   LDLCALC 86 01/07/2017    Physical Findings: AIMS: Facial and Oral Movements Muscles of Facial Expression: None, normal Lips and Perioral Area: None, normal Jaw: None, normal Tongue: None, normal,Extremity Movements Upper (arms, wrists, hands, fingers): None, normal Lower (legs, knees, ankles, toes): None, normal, Trunk Movements Neck, shoulders, hips: None, normal, Overall Severity Severity of abnormal movements (highest score from questions above): None, normal Incapacitation due to abnormal movements: None, normal Patient's awareness of abnormal movements (rate only patient's report): No Awareness, Dental Status Current problems with teeth and/or dentures?: No Does patient usually wear  dentures?: No  CIWA:  CIWA-Ar Total: 0 COWS:     Musculoskeletal: Strength & Muscle Tone: within normal limits Gait & Station: normal Patient leans: N/A  Psychiatric Specialty Exam: Physical Exam  Nursing note and vitals reviewed.   Review of Systems  All other systems reviewed and are negative.   Blood pressure 121/81, pulse 79, temperature 97.8 F (36.6 C), resp. rate 16, height 6' (1.829 m), weight 118.4 kg (261 lb), SpO2 96 %.Body mass index is 35.4 kg/m.  General Appearance: Casual  Eye Contact:  Good  Speech:  Clear and Coherent  Volume:  Normal  Mood:  Depressed but improving  Affect:  Congruent  Thought Process:  Goal Directed  Orientation:  Full (Time, Place, and Person)  Thought Content:  Negative  Suicidal Thoughts:  No  Homicidal Thoughts:  No  Memory:  Immediate;   Fair  Judgement:  Fair  Insight:  Fair  Psychomotor Activity:  Normal  Concentration:  Concentration: Fair  Recall:  [employer] of Knowledge:  Fair  Language:  Fair  Akathisia:  No      Assets:  Communication Skills Desire for Improvement Social Support  ADL's:  Intact  Cognition:  WNL  Sleep:  Number of Hours: 6.3     Treatment Plan Summary: 56 yo male admitted due to depression and SI. Pt reports improvement in mood and anxiety today and slept much better last night. He states that holidays are very hard on him. BP is much improved since stopping Effexor. He has been attending all groups and really benefiting from them  Plan:  MDD -Continue Remeron 15 mg qhs -Decrease Seroquel XR to 300 mg qhs due to history of torsades. He is aware of the risks of QTc prolonging medications but does not want to stop Seroquel.  Anxiety -Continue Buspar 10 mg TID -Continue Gabapentin 300 mg TID -prn Seroquel and hydroxyzine  HTN -BP improved today -Continue Lisinopril -Will check EKG to monitor QTc  Dispo -Pt will return to trailer on discharge. Likely discharge on Friday. He will follow  up with [place] with [facility] for peer and supportive employment services  Marylin Crosby, MD 11/10/2017, 10:17 AM

## 2017-11-10 NOTE — Progress Notes (Signed)
Received Ian [name] this am after breakfast, he was compliant with his medications.He endorsed feeling depressed 3/10, anxiety 5/10, but denied feeling suicidal and AH. He remained OOb in the milieu most of the day, attending the group therapy sessions and socializing with his peers.

## 2017-11-10 NOTE — Progress Notes (Signed)
Recreation Therapy Notes  Date: 11.20.18  Time: 1:00 pm  Location: Craft Room  Behavioral response: N/A  Intervention Topic: Time Management  Discussion/Intervention: Patient did not attend group. Clinical Observations/Feedback:  Patient did not attend group. Raelie Lohr LRT/CTRS           Davanee Klinkner 11/10/2017 2:35 PM

## 2017-11-11 MED ORDER — MIRTAZAPINE 15 MG PO TABS: 15.0000 mg | ORAL_TABLET | Freq: Every day | ORAL | 0 refills | Status: DC

## 2017-11-11 MED ORDER — BUSPIRONE HCL 10 MG PO TABS: 10.0000 mg | ORAL_TABLET | Freq: Three times a day (TID) | ORAL | 0 refills | Status: DC

## 2017-11-11 MED ORDER — ATORVASTATIN CALCIUM 40 MG PO TABS: 40.0000 mg | ORAL_TABLET | Freq: Every day | ORAL | 0 refills | Status: DC

## 2017-11-11 MED ORDER — ASPIRIN 81 MG PO TBEC: 81.0000 mg | DELAYED_RELEASE_TABLET | Freq: Every day | ORAL | 0 refills | Status: DC

## 2017-11-11 MED ORDER — QUETIAPINE FUMARATE ER 300 MG PO TB24: 300.0000 mg | ORAL_TABLET | Freq: Every day | ORAL | 0 refills | Status: DC

## 2017-11-11 MED ORDER — LISINOPRIL 20 MG PO TABS
10.0000 mg | ORAL_TABLET | Freq: Every day | ORAL | Status: DC
  Administered 2017-11-12 – 2017-11-14 (×3): 10 mg via ORAL
  Filled 2017-11-11 (×3): qty 1

## 2017-11-11 MED ORDER — LISINOPRIL 10 MG PO TABS: 10.0000 mg | ORAL_TABLET | Freq: Every day | ORAL | 0 refills | Status: DC

## 2017-11-11 MED ORDER — HYDROXYZINE HCL 50 MG PO TABS: 50.0000 mg | ORAL_TABLET | Freq: Four times a day (QID) | ORAL | 0 refills | Status: DC | PRN

## 2017-11-11 MED ORDER — GABAPENTIN 300 MG PO CAPS: 300.0000 mg | ORAL_CAPSULE | Freq: Three times a day (TID) | ORAL | 0 refills | Status: DC

## 2017-11-11 NOTE — Progress Notes (Signed)
Recreation Therapy Notes  Date: 11.21.2018  Time: 3:00pm  Location: Craft room  Behavioral response: N/A  Group Type: Craft  Participation level: N/A  Communication: N/A  Comments: Patient did not attend group.  Amatullah Christy LRT/CTRS           Gianella Chismar 11/11/2017 4:29 PM

## 2017-11-11 NOTE — Plan of Care (Signed)
  Progressing Coping: Ability to cope will improve 11/11/2017 2226 - Progressing by Anson [place], RN Ability to verbalize feelings will improve 11/11/2017 2226 - Progressing by Anson [place], RN Education: Emotional status will improve 11/11/2017 2226 - Progressing by Anson [place], RN Mental status will improve 11/11/2017 2226 - Progressing by Anson [place], RN Self-Concept: Ability to disclose and discuss suicidal ideas will improve 11/11/2017 2226 - Progressing by Anson [place], RN

## 2017-11-11 NOTE — Plan of Care (Signed)
Patient is compliant with medications, expresses concerns for blood pressure. Patient educated on  blood pressure medication, lisinopril. Patient has anxiety about blood pressure becoming too high. Patient received vistaril which did help calm patient. Patient is pleasant with staff and peers on the unit. Patient will continue to be encouraged to attend groups. Patient denies SI, HI and AVH.

## 2017-11-11 NOTE — Tx Team (Signed)
Interdisciplinary Treatment and Diagnostic Plan Update  11/11/2017 Time of Session: 11:15 Ian [name] MRN: [id number]  Principal Diagnosis: Severe recurrent major depression without psychotic features [facility])  Secondary Diagnoses: Principal Problem:   Severe recurrent major depression without psychotic features ([place])   Current Medications:  Current Facility-Administered Medications  Medication Dose Route Frequency Provider Last Rate Last Dose  . acetaminophen (TYLENOL) tablet 650 mg  650 mg Oral Q6H PRN [name], [name], MD   650 mg at 11/06/17 0828  . albuterol (PROVENTIL HFA;VENTOLIN HFA) 108 (90 Base) MCG/ACT inhaler 1 puff  1 puff Inhalation Daily PRN Marylin Crosby, MD   1 puff at 11/11/17 0906  . alum & mag hydroxide-simeth (MAALOX/MYLANTA) 200-200-20 MG/5ML suspension 30 mL  30 mL Oral Q4H PRN [name], [name], MD      . aspirin EC tablet 81 mg  81 mg Oral Daily [name], [name], MD   81 mg at 11/11/17 0907  . atorvastatin (LIPITOR) tablet 40 mg  40 mg Oral q1800 [name], [name], MD   40 mg at 11/10/17 1713  . busPIRone (BUSPAR) tablet 10 mg  10 mg Oral TID [name], [name], MD   10 mg at 11/11/17 1226  . cloNIDine (CATAPRES) tablet 0.1 mg  0.1 mg Oral Q6H PRN McNew, Tyson Babinski, MD      . folic acid (FOLVITE) tablet 1 mg  1 mg Oral Daily [name], [name], MD   1 mg at 11/11/17 0914  . gabapentin (NEURONTIN) tablet 300 mg  300 mg Oral TID Marylin Crosby, MD   300 mg at 11/11/17 1226  . hydrOXYzine (ATARAX/VISTARIL) tablet 50 mg  50 mg Oral Q6H PRN Lenward Chancellor, MD   50 mg at 11/11/17 1257  . ibuprofen (ADVIL,MOTRIN) tablet 600 mg  600 mg Oral Q6H PRN Lenward Chancellor, MD   600 mg at 11/11/17 1226  . Influenza vac split quadrivalent PF (FLUARIX) injection 0.5 mL  0.5 mL Intramuscular Tomorrow-1000 McNew, Tyson Babinski, MD      . [name] ON 11/12/2017] lisinopril (PRINIVIL,ZESTRIL) tablet 10 mg  10 mg Oral Daily McNew, Holly R, MD      . magnesium hydroxide (MILK OF MAGNESIA) suspension 30  mL  30 mL Oral Daily PRN [name], [name], MD      . mirtazapine (REMERON) tablet 15 mg  15 mg Oral QHS McNew, Tyson Babinski, MD   15 mg at 11/10/17 2149  . multivitamin with minerals tablet 1 tablet  1 tablet Oral Daily [name], [name], MD   1 tablet at 11/11/17 [phone]  . nicotine (NICODERM CQ - dosed in mg/24 hours) patch 21 mg  21 mg Transdermal Daily McNew, Tyson Babinski, MD   21 mg at 11/11/17 0914  . QUEtiapine (SEROQUEL XR) 24 hr tablet 300 mg  300 mg Oral QHS McNew, Tyson Babinski, MD   300 mg at 11/10/17 2149  . QUEtiapine (SEROQUEL) tablet 25 mg  25 mg Oral TID PRN McNew, Tyson Babinski, MD      . thiamine (VITAMIN B-1) tablet 100 mg  100 mg Oral Daily [name], [name], MD   100 mg at 11/11/17 2440   Or  . thiamine (B-1) injection 100 mg  100 mg Intravenous Daily [name], [name], MD       PTA Medications: Medications Prior to Admission  Medication Sig Dispense Refill Last Dose  . buPROPion (WELLBUTRIN XL) 150 MG 24 hr tablet Take 1 tablet (150 mg total) by mouth daily. (  Patient not taking: Reported on 11/03/2017) 30 tablet 1 Not Taking at Unknown time  . busPIRone (BUSPAR) 10 MG tablet Take 10 mg 2 (two) times daily by mouth.  2 Unknown at Unknown  . calcium carbonate (TUMS) 500 MG chewable tablet Chew 2 tablets (400 mg of elemental calcium total) by mouth 3 (three) times daily as needed for indigestion or heartburn. (Patient not taking: Reported on 11/03/2017) 90 tablet 0 Not Taking at Unknown time  . Melatonin 5 MG TABS Take 2 tablets (10 mg total) by mouth at bedtime. 30 tablet 0 Unknown at Unknown  . QUEtiapine (SEROQUEL XR) 300 MG 24 hr tablet Take 1 tablet (300 mg total) by mouth at bedtime. (Patient not taking: Reported on 11/03/2017) 30 tablet 1 Not Taking at Unknown time  . QUEtiapine (SEROQUEL XR) 400 MG 24 hr tablet Take 400 mg at bedtime by mouth.   Unknown at Unknown  . QUEtiapine (SEROQUEL) 50 MG tablet Take 1 tablet (50 mg total) by mouth 2 (two) times daily. 60 tablet 1 Unknown at Unknown  .  [DISCONTINUED] atorvastatin (LIPITOR) 40 MG tablet Take 1 tablet (40 mg total) by mouth daily at 6 PM. 30 tablet 0 Unknown at Unknown    Patient Stressors: Financial difficulties Health problems Medication change or noncompliance Substance abuse  Patient Strengths: Ability for insight Average or above average intelligence Communication skills  Treatment Modalities: Medication Management, Group therapy, Case management,  1 to 1 session with clinician, Psychoeducation, Recreational therapy.   Physician Treatment Plan for Primary Diagnosis: Severe recurrent major depression without psychotic features ([place]) Long Term Goal(s): Improvement in symptoms so as ready for discharge Improvement in symptoms so as ready for discharge   Short Term Goals: Ability to disclose and discuss suicidal ideas Ability to identify changes in lifestyle to reduce recurrence of condition will improve Ability to verbalize feelings will improve Ability to disclose and discuss suicidal ideas  Medication Management: Evaluate patient's response, side effects, and tolerance of medication regimen.  Therapeutic Interventions: 1 to 1 sessions, Unit Group sessions and Medication administration.  Evaluation of Outcomes: Progressing  Physician Treatment Plan for Secondary Diagnosis: Principal Problem:   Severe recurrent major depression without psychotic features ([place])  Long Term Goal(s): Improvement in symptoms so as ready for discharge Improvement in symptoms so as ready for discharge   Short Term Goals: Ability to disclose and discuss suicidal ideas Ability to identify changes in lifestyle to reduce recurrence of condition will improve Ability to verbalize feelings will improve Ability to disclose and discuss suicidal ideas     Medication Management: Evaluate patient's response, side effects, and tolerance of medication regimen.  Therapeutic Interventions: 1 to 1 sessions, Unit Group sessions and Medication  administration.  Evaluation of Outcomes: Progressing   RN Treatment Plan for Primary Diagnosis: Severe recurrent major depression without psychotic features ([place]) Long Term Goal(s): Knowledge of disease and therapeutic regimen to maintain health will improve  Short Term Goals: Ability to verbalize feelings will improve, Ability to disclose and discuss suicidal ideas, Ability to identify and develop effective coping behaviors will improve and Compliance with prescribed medications will improve  Medication Management: RN will administer medications as ordered by provider, will assess and evaluate patient's response and provide education to patient for prescribed medication. RN will report any adverse and/or side effects to prescribing provider.  Therapeutic Interventions: 1 on 1 counseling sessions, Psychoeducation, Medication administration, Evaluate responses to treatment, Monitor vital signs and CBGs as ordered, Perform/monitor CIWA, COWS, AIMS and Fall  Risk screenings as ordered, Perform wound care treatments as ordered.  Evaluation of Outcomes: Progressing   LCSW Treatment Plan for Primary Diagnosis: Severe recurrent major depression without psychotic features ([place]) Long Term Goal(s): Safe transition to appropriate next level of care at discharge, Engage patient in therapeutic group addressing interpersonal concerns.  Short Term Goals: Engage patient in aftercare planning with referrals and resources, Increase social support and Increase skills for wellness and recovery  Therapeutic Interventions: Assess for all discharge needs, 1 to 1 time with Social worker, Explore available resources and support systems, Assess for adequacy in community support network, Educate family and significant other(s) on suicide prevention, Complete Psychosocial Assessment, Interpersonal group therapy.  Evaluation of Outcomes: Progressing   Progress in Treatment: Attending groups: Yes. Participating in  groups: Yes. Taking medication as prescribed: Yes. Toleration medication: Yes. Family/Significant other contact made: No, will contact:  when given permission Patient understands diagnosis: Yes. Discussing patient identified problems/goals with staff: Yes. Medical problems stabilized or resolved: Yes. Denies suicidal/homicidal ideation: Yes. Issues/concerns per patient self-inventory: No. Other: none  New problem(s) identified: No, Describe:  none  New Short Term/Long Term Goal(s):Pt goal: to find a happy medium with my medication.  Discharge Plan or Barriers: Dicharge home with outpatient med management with [place] and Peer services and employment services with [place]  Reason for Continuation of Hospitalization: Depression Medication stabilization  Estimated Length of Stay: 2 days.  Recreational Therapy: Patient Stressors: Family, Work Patient Goal: Patient will identify 3 positive coping skills to decrease depressive symptoms x5 days.    Attendees: Patient:Ian [name] 11/11/2017   Physician: Dr Wonda Olds, MD 11/11/2017   Nursing: Tyler Pita, RN 11/11/2017   RN Care Manager: 11/11/2017   Social Worker: Lurline Idol, LCSW 11/11/2017   Recreational Therapist: Roanna Epley, LRT/CTRS 11/11/2017   Other:    Other:    Other:     Scribe for Treatment Team: August Saucer, LCSW 11/11/2017 2:39 PM

## 2017-11-11 NOTE — Progress Notes (Signed)
D: Patient denies SI/HI/AVH. Patient presents anxious and worried, patient states "it hasn't been a good day." patient affect is depressed during assessment. Patient complains of 5/10 pain of the back and feet. Patient Blood pressure was elevated 157/97 pulse 82. Patient received PRN Ibuprofen and clonidine. Patient is active in milieu and interacting with peers. Patient is expressive of thoughts and feelings.   A: Patient was assessed by this nurse. Patient received scheduled medications. Q x 15 minute observation checks were completed for safety. Patient was provided with verbal education on provided medications. Patient care plan was reviewed. Patient was offered support and encouragement. Patient was encourage to attend groups, participate in unit activities and continue with plan of care.   R: Patient adheres with scheduled medication. Patient responded well to PRN medications and symptoms were relieved. Patient has no complaints of pain at this time. Patient is receptive to treatment and safety maintained on unit. Patient had an estimated 6.15 hours of restful sleep.

## 2017-11-11 NOTE — Progress Notes (Signed)
Recreation Therapy Notes   Date: 11.21.18  Time: 1:00pm  Location: Craft Room  Behavioral response: Appropriate  Intervention Topic: Leisure  Discussion/Intervention: Group content today was focused on leisure. The group defined what leisure is and some positive leisure activities they participate in. Individuals identified the difference between good and bad leisure. Participants expressed how they feel after participating in the leisure of their choice. The group discussed how they go about picking a leisure activity and if others are involved in their leisure activities. The patient stated how many leisure activities they too choose from and reasons why it is important to have leisure time. Individuals participated in the intervention "Leisure Jeopardy" where they had a chance to identify new leisure activities as well as benefits of leisure. Clinical Observations/Feedback:  Patient came to group and expressed that his leisure is working. 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 Individual participated in the intervention and was social with peers and staff.  Malaysha Arlen LRT/CTRS         Norely Schlick 11/11/2017 2:05 PM

## 2017-11-11 NOTE — Progress Notes (Addendum)
[facility] MD Progress Note  11/11/2017 2:19 PM Ian [name]  MRN:  [id number]   Subjective:  Pt states that he is "doing okay" today. He feels depressed today "and I don't know why. I just woke up that way." He states that he talked to a friend and his friend is going to have him do some mowing for him on Friday. He states that he is looking forward to this and wants to get back to work. When he stays busy, things are better for him. He denies SI or thoughts of self harm today. He feels the medications he is on currently are helping a lot with his anxiety. He still feels slightly anxious but much improved. He is sleeping well with decreased dose of Seroquel.  He was concerned about his blood pressure. It was slightly elevated this morning but better this afternoon. We discussed that it is extremely important to follow up with his PCP for blood pressure management due to his cardiac history. He acknowledges this and agrees to do. He plans to meet with Lanae Boast with [facility] on Monday and he is looking forward to this and hopefully get individual therapy at [facility] and medication management at [place]. Pt states that he feels he will be ready for discharge on Friday and wants to work that day.   Principal Problem: Severe recurrent major depression without psychotic features ([place]) Diagnosis:   Patient Active Problem List   Diagnosis Date Noted  . Severe recurrent major depression without psychotic features ([place]) [F33.2] 11/05/2017  . Dyslipidemia [E78.5] 01/07/2017  . Barrett's esophagus [K22.70] 01/07/2017  . Bipolar I disorder, most recent episode depressed with anxious distress ([place]) [F31.9] 01/06/2017  . Cannabis use disorder, moderate, dependence ([place]) [F12.20] 01/06/2017  . Tobacco use disorder [F17.200] 01/06/2017  . Alcohol use disorder, moderate, dependence ([place]) [F10.20] 10/06/2016  . Substance induced mood disorder ([place]) [F19.94] 10/06/2016  . Cocaine use disorder, moderate, dependence ([place]) [F14.20]  10/06/2016  . Depression [F32.9] 10/06/2016  . Acute left-sided weakness [M62.89] 05/03/2015  . CVA (cerebral infarction) [I63.9] 05/03/2015  . Weakness [R53.1] 11/16/2013  . Chest pain [R07.9] 11/16/2013  . HTN (hypertension) [I10] 11/16/2013  . Left-sided weakness [R53.1] 11/16/2013  . Torsades de pointes ([place]) [I47.2] 11/16/2013  . Hemiplegia, unspecified, affecting nondominant side [G81.90] 11/15/2013  . Speech and language deficits [F80.9, R47.9] 11/15/2013   Total Time spent with patient: 20 minutes  Past Psychiatric History: See H&P  Past Medical History:  Past Medical History:  Diagnosis Date  . Anxiety   . Barrett esophagus   . Cancer ([place])   . Coronary artery disease   . Depression   . GERD (gastroesophageal reflux disease)   . Hypertension   . Stroke [facility] 2014   "mini-stroke" per patient    Past Surgical History:  Procedure Laterality Date  . ANGIOPLASTY    . APPENDECTOMY    . CARDIAC CATHETERIZATION    . CHOLECYSTECTOMY    . WRIST SURGERY Right    age 50   Family History:  Family History  Problem Relation Age of Onset  . Stroke Father   . Leukemia Father   . Breast cancer Sister    Family Psychiatric  History: See H&P Social History:  Social History   Substance and Sexual Activity  Alcohol Use Yes   Comment: denies being a daily drinker but did have alcohol today     Social History   Substance and Sexual Activity  Drug Use Yes  . Types: Cocaine,  Marijuana   Comment: used cocaine today; denies smoking marijuana for years    Social History   Socioeconomic History  . Marital status: Single    Spouse name: None  . Number of children: None  . Years of education: None  . Highest education level: None  Social Needs  . Financial resource strain: None  . Food insecurity - worry: None  . Food insecurity - inability: None  . Transportation needs - medical: None  . Transportation needs - non-medical: None  Occupational History  . None   Tobacco Use  . Smoking status: Current Every Day Smoker    Packs/day: 1.00    Types: Cigarettes  . Smokeless tobacco: Never Used  Substance and Sexual Activity  . Alcohol use: Yes    Comment: denies being a daily drinker but did have alcohol today  . Drug use: Yes    Types: Cocaine, Marijuana    Comment: used cocaine today; denies smoking marijuana for years  . Sexual activity: None  Other Topics Concern  . None  Social History Narrative  . None   Additional Social History:                         Sleep: Good  Appetite:  Good  Current Medications: Current Facility-Administered Medications  Medication Dose Route Frequency Provider Last Rate Last Dose  . acetaminophen (TYLENOL) tablet 650 mg  650 mg Oral Q6H PRN [name], [name], MD   650 mg at 11/06/17 0828  . albuterol (PROVENTIL HFA;VENTOLIN HFA) 108 (90 Base) MCG/ACT inhaler 1 puff  1 puff Inhalation Daily PRN Marylin Crosby, MD   1 puff at 11/11/17 0906  . alum & mag hydroxide-simeth (MAALOX/MYLANTA) 200-200-20 MG/5ML suspension 30 mL  30 mL Oral Q4H PRN [name], [name], MD      . aspirin EC tablet 81 mg  81 mg Oral Daily [name], [name], MD   81 mg at 11/11/17 0907  . atorvastatin (LIPITOR) tablet 40 mg  40 mg Oral q1800 [name], [name], MD   40 mg at 11/10/17 1713  . busPIRone (BUSPAR) tablet 10 mg  10 mg Oral TID [name], [name], MD   10 mg at 11/11/17 1226  . cloNIDine (CATAPRES) tablet 0.1 mg  0.1 mg Oral Q6H PRN Adis Sturgill, Tyson Babinski, MD      . folic acid (FOLVITE) tablet 1 mg  1 mg Oral Daily [name], [name], MD   1 mg at 11/11/17 0914  . gabapentin (NEURONTIN) tablet 300 mg  300 mg Oral TID Marylin Crosby, MD   300 mg at 11/11/17 1226  . hydrOXYzine (ATARAX/VISTARIL) tablet 50 mg  50 mg Oral Q6H PRN Lenward Chancellor, MD   50 mg at 11/11/17 1257  . ibuprofen (ADVIL,MOTRIN) tablet 600 mg  600 mg Oral Q6H PRN Lenward Chancellor, MD   600 mg at 11/11/17 1226  . Influenza vac split quadrivalent PF (FLUARIX) injection  0.5 mL  0.5 mL Intramuscular Tomorrow-1000 Bethanne Mule R, MD      . lisinopril (PRINIVIL,ZESTRIL) tablet 5 mg  5 mg Oral Daily [name], [name], MD   5 mg at 11/11/17 0908  . magnesium hydroxide (MILK OF MAGNESIA) suspension 30 mL  30 mL Oral Daily PRN [name], [name], MD      . mirtazapine (REMERON) tablet 15 mg  15 mg Oral QHS Diesha Rostad, Tyson Babinski, MD   15 mg at 11/10/17 2149  . multivitamin with  minerals tablet 1 tablet  1 tablet Oral Daily [name], [name], MD   1 tablet at 11/11/17 0907  . nicotine (NICODERM CQ - dosed in mg/24 hours) patch 21 mg  21 mg Transdermal Daily Airiel Oblinger, Tyson Babinski, MD   21 mg at 11/11/17 0914  . QUEtiapine (SEROQUEL XR) 24 hr tablet 300 mg  300 mg Oral QHS Cyris Maalouf, Tyson Babinski, MD   300 mg at 11/10/17 2149  . QUEtiapine (SEROQUEL) tablet 25 mg  25 mg Oral TID PRN Tiffiany Beadles, Tyson Babinski, MD      . thiamine (VITAMIN B-1) tablet 100 mg  100 mg Oral Daily [name], [name], MD   100 mg at 11/11/17 1287   Or  . thiamine (B-1) injection 100 mg  100 mg Intravenous Daily [name], [name], MD        Lab Results: No results found for this or any previous visit (from the past 48 hour(s)).  Blood Alcohol level:  Lab Results  Component Value Date   ETH 268 (H) 11/03/2017   ETH 171 (H) 86/76/7209    Metabolic Disorder Labs: Lab Results  Component Value Date   HGBA1C 5.5 11/06/2017   MPG 111.15 11/06/2017   MPG 103 01/07/2017   No results found for: PROLACTIN Lab Results  Component Value Date   CHOL 112 11/06/2017   TRIG 98 11/06/2017   HDL 34 (L) 11/06/2017   CHOLHDL 3.3 11/06/2017   VLDL 20 11/06/2017   LDLCALC 58 11/06/2017   LDLCALC 86 01/07/2017    Physical Findings: AIMS: Facial and Oral Movements Muscles of Facial Expression: None, normal Lips and Perioral Area: None, normal Jaw: None, normal Tongue: None, normal,Extremity Movements Upper (arms, wrists, hands, fingers): None, normal Lower (legs, knees, ankles, toes): None, normal, Trunk Movements Neck, shoulders, hips:  None, normal, Overall Severity Severity of abnormal movements (highest score from questions above): None, normal Incapacitation due to abnormal movements: None, normal Patient's awareness of abnormal movements (rate only patient's report): No Awareness, Dental Status Current problems with teeth and/or dentures?: No Does patient usually wear dentures?: No  CIWA:  CIWA-Ar Total: 0 COWS:     Musculoskeletal: Strength & Muscle Tone: within normal limits Gait & Station: normal Patient leans: N/A  Psychiatric Specialty Exam: Physical Exam  Nursing note and vitals reviewed.   Review of Systems  All other systems reviewed and are negative. Mild chest pain earlier in the day  Blood pressure (!) 130/93, pulse 91, temperature 98 F (36.7 C), resp. rate 18, height 6' (1.829 m), weight 118.4 kg (261 lb), SpO2 96 %.Body mass index is 35.4 kg/m.  General Appearance: Casual  Eye Contact:  Good  Speech:  Clear and Coherent  Volume:  Normal  Mood:  Depressed  Affect:  Blunt  Thought Process:  Goal Directed  Orientation:  Full (Time, Place, and Person)  Thought Content:  Negative  Suicidal Thoughts:  No  Homicidal Thoughts:  No  Memory:  Immediate;   Fair  Judgement:  Fair  Insight:  Fair  Psychomotor Activity:  Normal  Concentration:  Concentration: Fair  Recall:  [employer] of Knowledge:  Fair  Language:  Fair  Akathisia:  No      Assets:  Communication Skills Desire for Improvement  ADL's:  Intact  Cognition:  WNL  Sleep:  Number of Hours: 6.15     Treatment Plan Summary: 57 yo male admitted due to [place]. Anxiety and mood has been improving with medication changes. He feels like  the medication regimen he is on now has been very helpful. He is more future oriented and looking forward to getting back to work on Friday.   Plan: MDD -Continue Remeron 15 mg qhs -Continue lower dose of Seroquel XR 300 mg qhs. Pt has history of torsades. He is aware of risks of QTc prolonging  medications but does not want to stop Seroquel. QTc is normal at 429 -QTc has been okay. Will check EKG again today.   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 Likely discharge on Friday. He will follow up with [place] with [facility] for peer and supportive employment services      Marylin Crosby, MD 11/11/2017, 2:19 PM

## 2017-11-11 NOTE — Plan of Care (Signed)
Patient slept for Estimated Hours of 6.15; Precautionary checks every 15 minutes for safety maintained, room free of safety hazards, patient sustains no injury or falls during this shift.  

## 2017-11-11 NOTE — B[facility] Group Notes (Signed)
  [place] LCSW Group Therapy Note  Date/Time: 11/11/17, 0930  Type of Therapy/Topic:  Group Therapy:  Emotion Regulation  Participation Level:  Did Not Attend   Mood:  Description of Group:    The purpose of this group is to assist patients in learning to regulate negative emotions and experience positive emotions. Patients will be guided to discuss ways in which they have been vulnerable to their negative emotions. These vulnerabilities will be juxtaposed with experiences of positive emotions or situations, and patients challenged to use positive emotions to combat negative ones. Special emphasis will be placed on coping with negative emotions in conflict situations, and patients will process healthy conflict resolution skills.  Therapeutic Goals: 1. Patient will identify two positive emotions or experiences to reflect on in order to balance out negative emotions:  2. Patient will label two or more emotions that they find the most difficult to experience:  3. Patient will be able to demonstrate positive conflict resolution skills through discussion or role plays:   Summary of Patient Progress:       Therapeutic Modalities:   Cognitive Behavioral Therapy Feelings Identification Dialectical Behavioral Therapy  Lurline Idol, LCSW

## 2017-11-12 DIAGNOSIS — F332 Major depressive disorder, recurrent severe without psychotic features: Secondary | ICD-10-CM

## 2017-11-12 NOTE — BHH Group Notes (Signed)
[place] Group Notes:  (Nursing/MHT/Case Management/Adjunct)  Date:  11/12/2017  Time:  1:30 AM  Type of Therapy:  Group Therapy  Participation Level:  Active  Participation Quality:  Appropriate  Affect:  Appropriate  Cognitive:  Appropriate  Insight:  Good  Engagement in Group:  Engaged  Modes of Intervention:  Support  Summary of Progress/Problems:  Ian [name] 11/12/2017, 1:30 AM

## 2017-11-12 NOTE — Plan of Care (Signed)
Patient up ad lib with steady gait, A&O x 3. Patient verbalized feeling a little anxious due to his uncertainty of his living arrangements once he is discharged on tomorrow. States, "The place that I thought that I was going fell through, I don't know where I am going to go now." Patient denies SI/HI/AVH. Presents with a flat affect but gets brighter when speaking to staff. Present in the milieu, selective peer interaction noted. Compliant with medications and meals. Will continue to monitor.

## 2017-11-12 NOTE — Progress Notes (Signed)
Patient up ad lib with steady gait, A&O x 3. Patient verbalized feeling a little anxious due to his uncertainty of his living arrangements once he is discharged on tomorrow. States, "The place that I thought that I was going fell through, I don't know where I am going to go now." Patient denies SI/HI/AVH. Presents with a flat affect but gets brighter when speaking to staff. Present in the milieu, selective peer interaction noted. Compliant with medications and meals. Will continue to monitor.

## 2017-11-12 NOTE — Progress Notes (Signed)
[facility] MD Progress Note  11/12/2017 2:19 PM Ian [name]  MRN:  [id number]   Subjective: Endorses to me that his trailer has been marked as unlivable by the [place] and he will not be able to return there when he gets discharged from the hospital.  The earliest he will have a place to stay would be Sunday and that would be with his friend who is supposed to pick him up from the hospital.  Endorses to me that he has been doing well otherwise and his mood has been okay.  Feels a little " down" by the news that his trailer will not be accessible but tells me that it is what it is.  Denies any suicidal thoughts or thoughts about dying.  Endorses that his mood has been okay.  Tells me that the medications are doing okay and that he looks forward to going home and starting to work.  Principal Problem: Severe recurrent major depression without psychotic features ([place]) Diagnosis:   Patient Active Problem List   Diagnosis Date Noted  . Severe recurrent major depression without psychotic features ([place]) [F33.2] 11/05/2017  . Dyslipidemia [E78.5] 01/07/2017  . Barrett's esophagus [K22.70] 01/07/2017  . Bipolar I disorder, most recent episode depressed with anxious distress ([place]) [F31.9] 01/06/2017  . Cannabis use disorder, moderate, dependence ([place]) [F12.20] 01/06/2017  . Tobacco use disorder [F17.200] 01/06/2017  . Alcohol use disorder, moderate, dependence ([place]) [F10.20] 10/06/2016  . Substance induced mood disorder ([place]) [F19.94] 10/06/2016  . Cocaine use disorder, moderate, dependence ([place]) [F14.20] 10/06/2016  . Depression [F32.9] 10/06/2016  . Acute left-sided weakness [M62.89] 05/03/2015  . CVA (cerebral infarction) [I63.9] 05/03/2015  . Weakness [R53.1] 11/16/2013  . Chest pain [R07.9] 11/16/2013  . HTN (hypertension) [I10] 11/16/2013  . Left-sided weakness [R53.1] 11/16/2013  . Torsades de pointes ([place]) [I47.2] 11/16/2013  . Hemiplegia, unspecified, affecting nondominant side [G81.90]  11/15/2013  . Speech and language deficits [F80.9, R47.9] 11/15/2013   Total Time spent with patient: 30 minutes  Past Psychiatric History: See H&P  Past Medical History:  Past Medical History:  Diagnosis Date  . Anxiety   . Barrett esophagus   . Cancer ([place])   . Coronary artery disease   . Depression   . GERD (gastroesophageal reflux disease)   . Hypertension   . Stroke [facility]) 2014   "mini-stroke" per patient    Past Surgical History:  Procedure Laterality Date  . ANGIOPLASTY    . APPENDECTOMY    . CARDIAC CATHETERIZATION    . CHOLECYSTECTOMY    . WRIST SURGERY Right    age 19   Family History:  Family History  Problem Relation Age of Onset  . Stroke Father   . Leukemia Father   . Breast cancer Sister    Family Psychiatric  History: See H&P Social History:  Social History   Substance and Sexual Activity  Alcohol Use Yes   Comment: denies being a daily drinker but did have alcohol today     Social History   Substance and Sexual Activity  Drug Use Yes  . Types: Cocaine, Marijuana   Comment: used cocaine today; denies smoking marijuana for years    Social History   Socioeconomic History  . Marital status: Single    Spouse name: None  . Number of children: None  . Years of education: None  . Highest education level: None  Social Needs  . Financial resource strain: None  . Food insecurity - worry: None  . Food  insecurity - inability: None  . Transportation needs - medical: None  . Transportation needs - non-medical: None  Occupational History  . None  Tobacco Use  . Smoking status: Current Every Day Smoker    Packs/day: 1.00    Types: Cigarettes  . Smokeless tobacco: Never Used  Substance and Sexual Activity  . Alcohol use: Yes    Comment: denies being a daily drinker but did have alcohol today  . Drug use: Yes    Types: Cocaine, Marijuana    Comment: used cocaine today; denies smoking marijuana for years  . Sexual activity: None  Other Topics  Concern  . None  Social History Narrative  . None   Additional Social History:                         Sleep: Good  Appetite:  Good  Current Medications: Current Facility-Administered Medications  Medication Dose Route Frequency Provider Last Rate Last Dose  . acetaminophen (TYLENOL) tablet 650 mg  650 mg Oral Q6H PRN [name], [name], MD   650 mg at 11/06/17 0828  . albuterol (PROVENTIL HFA;VENTOLIN HFA) 108 (90 Base) MCG/ACT inhaler 1 puff  1 puff Inhalation Daily PRN Marylin Crosby, MD   1 puff at 11/11/17 2142  . alum & mag hydroxide-simeth (MAALOX/MYLANTA) 200-200-20 MG/5ML suspension 30 mL  30 mL Oral Q4H PRN [name], [name], MD      . aspirin EC tablet 81 mg  81 mg Oral Daily [name], [name], MD   81 mg at 11/12/17 0904  . atorvastatin (LIPITOR) tablet 40 mg  40 mg Oral q1800 [name], [name], MD   40 mg at 11/11/17 1658  . busPIRone (BUSPAR) tablet 10 mg  10 mg Oral TID [name], [name], MD   10 mg at 11/12/17 1155  . cloNIDine (CATAPRES) tablet 0.1 mg  0.1 mg Oral Q6H PRN Marylin Crosby, MD   0.1 mg at 11/11/17 2142  . folic acid (FOLVITE) tablet 1 mg  1 mg Oral Daily [name], [name], MD   1 mg at 11/12/17 0904  . gabapentin (NEURONTIN) tablet 300 mg  300 mg Oral TID Marylin Crosby, MD   300 mg at 11/12/17 1155  . hydrOXYzine (ATARAX/VISTARIL) tablet 50 mg  50 mg Oral Q6H PRN Lenward Chancellor, MD   50 mg at 11/11/17 1257  . ibuprofen (ADVIL,MOTRIN) tablet 600 mg  600 mg Oral Q6H PRN Lenward Chancellor, MD   600 mg at 11/11/17 2145  . Influenza vac split quadrivalent PF (FLUARIX) injection 0.5 mL  0.5 mL Intramuscular Tomorrow-1000 McNew, Holly R, MD      . lisinopril (PRINIVIL,ZESTRIL) tablet 10 mg  10 mg Oral Daily McNew, Tyson Babinski, MD   10 mg at 11/12/17 0904  . magnesium hydroxide (MILK OF MAGNESIA) suspension 30 mL  30 mL Oral Daily PRN [name], [name], MD      . mirtazapine (REMERON) tablet 15 mg  15 mg Oral QHS McNew, Tyson Babinski, MD   15 mg at 11/11/17 2142  .  multivitamin with minerals tablet 1 tablet  1 tablet Oral Daily [name], [name], MD   1 tablet at 11/12/17 0904  . nicotine (NICODERM CQ - dosed in mg/24 hours) patch 21 mg  21 mg Transdermal Daily McNew, Tyson Babinski, MD   21 mg at 11/12/17 0903  . QUEtiapine (SEROQUEL XR) 24 hr tablet 300 mg  300 mg Oral QHS McNew, Holly  R, MD   300 mg at 11/11/17 2142  . QUEtiapine (SEROQUEL) tablet 25 mg  25 mg Oral TID PRN McNew, Tyson Babinski, MD      . thiamine (VITAMIN B-1) tablet 100 mg  100 mg Oral Daily [name], [name], MD   100 mg at 11/12/17 8119   Or  . thiamine (B-1) injection 100 mg  100 mg Intravenous Daily [name], [name], MD        Lab Results: No results found for this or any previous visit (from the past 48 hour(s)).  Blood Alcohol level:  Lab Results  Component Value Date   ETH 268 (H) 11/03/2017   ETH 171 (H) 14/78/2956    Metabolic Disorder Labs: Lab Results  Component Value Date   HGBA1C 5.5 11/06/2017   MPG 111.15 11/06/2017   MPG 103 01/07/2017   No results found for: PROLACTIN Lab Results  Component Value Date   CHOL 112 11/06/2017   TRIG 98 11/06/2017   HDL 34 (L) 11/06/2017   CHOLHDL 3.3 11/06/2017   VLDL 20 11/06/2017   LDLCALC 58 11/06/2017   LDLCALC 86 01/07/2017    Physical Findings: AIMS: Facial and Oral Movements Muscles of Facial Expression: None, normal Lips and Perioral Area: None, normal Jaw: None, normal Tongue: None, normal,Extremity Movements Upper (arms, wrists, hands, fingers): None, normal Lower (legs, knees, ankles, toes): None, normal, Trunk Movements Neck, shoulders, hips: None, normal, Overall Severity Severity of abnormal movements (highest score from questions above): None, normal Incapacitation due to abnormal movements: None, normal Patient's awareness of abnormal movements (rate only patient's report): No Awareness, Dental Status Current problems with teeth and/or dentures?: No Does patient usually wear dentures?: No  CIWA:  CIWA-Ar Total:  2 COWS:     Musculoskeletal: Strength & Muscle Tone: within normal limits Gait & Station: normal Patient leans: N/A  Psychiatric Specialty Exam: Physical Exam  Nursing note and vitals reviewed.   Review of Systems  All other systems reviewed and are negative. Mild chest pain earlier in the day  Blood pressure (!) 135/93, pulse 85, temperature 99.1 F (37.3 C), temperature source Oral, resp. rate 18, height 6' (1.829 m), weight 261 lb (118.4 kg), SpO2 96 %.Body mass index is 35.4 kg/m.  General Appearance: Casual  Eye Contact:  Good  Speech:  Clear and Coherent  Volume:  Normal  Mood:  Depressed  Affect:  Blunt  Thought Process:  Goal Directed  Orientation:  Full (Time, Place, and Person)  Thought Content:  Negative  Suicidal Thoughts:  No  Homicidal Thoughts:  No  Memory:  Immediate;   Fair  Judgement:  Fair  Insight:  Fair  Psychomotor Activity:  Normal  Concentration:  Concentration: Fair  Recall:  [employer] of Knowledge:  Fair  Language:  Fair  Akathisia:  No      Assets:  Communication Skills Desire for Improvement  ADL's:  Intact  Cognition:  WNL  Sleep:  Number of Hours: 6.25     Treatment Plan Summary: 56 yo male admitted due to [place]. Anxiety and mood has been improving with medication changes. He feels like the medication regimen he is on now has been very helpful. He is more future oriented and looking forward to getting back to work on Friday.   Plan: MDD -Continue Remeron 15 mg qhs -Continue lower dose of Seroquel XR 300 mg qhs. Pt has history of torsades. He is aware of risks of QTc prolonging medications but does not want to  stop Seroquel. QTc is normal at 429   Anxiety -Continue Buspar 10 mg TID -Continue Gabapentin 300 mg TID -prn Seroquel and hydroxyzine  HTN -increase Lisinopril to 10 mg daily -Check EKG today-QTc 429 11/11/17 -prn Clonidine  Dispo -Patient was supposed to return to trailer but this will not be possible.  Endorses  that his new living arrangement would be available by Sunday.  He will follow up with [place] with [facility] for peer and supportive employment services      Ramond Dial, MD 11/12/2017, 2:19 PM

## 2017-11-13 NOTE — [facility] Group Notes (Signed)
[facility] Group Notes:  (Nursing/MHT/Case Management/Adjunct)  Date:  11/13/2017  Time:  6:26 PM  Type of Therapy:  Psychoeducational Skills  Participation Level:  Did Not Attend  Alois Cliche 11/13/2017, 6:26 PM

## 2017-11-13 NOTE — Progress Notes (Signed)
Patient anxious but cooperative.  Patient concerned about discharge stating, "I'm ready to go."  SW informed patient about discharge plan on Monday.  Patient will discharge at 10 am and will be picked up by his friend.  Patient accepts discharge plan.  No distress noted.

## 2017-11-13 NOTE — Progress Notes (Signed)
[facility] MD Progress Note  11/13/2017 1:29 PM Ian [name]  MRN:  [id number]   Subjective: Patient continues to be anxious about his living situation following discharge.  Says he could possibly go to his trailer but he does not want to risk not having a place to stay. Was informed yesterday that his trailer is not habitable and has been concerned about this ever since.  Social worker spoke with him today and the earliest he can go to his friend's place would be Sunday or Monday, depending on his friend coming back from his beach trip.   States he is otherwise okay, says he has been feeling better compared to the time he came into the hospital.  Denies any suicidal thoughts or thoughts about dying. Principal Problem: Severe recurrent major depression without psychotic features ([place]) Diagnosis:   Patient Active Problem List   Diagnosis Date Noted  . Severe recurrent major depression without psychotic features ([place]) [F33.2] 11/05/2017  . Dyslipidemia [E78.5] 01/07/2017  . Barrett's esophagus [K22.70] 01/07/2017  . Bipolar I disorder, most recent episode depressed with anxious distress ([place]) [F31.9] 01/06/2017  . Cannabis use disorder, moderate, dependence ([place]) [F12.20] 01/06/2017  . Tobacco use disorder [F17.200] 01/06/2017  . Alcohol use disorder, moderate, dependence ([place]) [F10.20] 10/06/2016  . Substance induced mood disorder ([place]) [F19.94] 10/06/2016  . Cocaine use disorder, moderate, dependence ([place]) [F14.20] 10/06/2016  . Depression [F32.9] 10/06/2016  . Acute left-sided weakness [M62.89] 05/03/2015  . CVA (cerebral infarction) [I63.9] 05/03/2015  . Weakness [R53.1] 11/16/2013  . Chest pain [R07.9] 11/16/2013  . HTN (hypertension) [I10] 11/16/2013  . Left-sided weakness [R53.1] 11/16/2013  . Torsades de pointes ([place]) [I47.2] 11/16/2013  . Hemiplegia, unspecified, affecting nondominant side [G81.90] 11/15/2013  . Speech and language deficits [F80.9, R47.9] 11/15/2013   Total Time  spent with patient: 30 minutes  Past Psychiatric History: See H&P  Past Medical History:  Past Medical History:  Diagnosis Date  . Anxiety   . Barrett esophagus   . Cancer ([place])   . Coronary artery disease   . Depression   . GERD (gastroesophageal reflux disease)   . Hypertension   . Stroke [facility]) 2014   "mini-stroke" per patient    Past Surgical History:  Procedure Laterality Date  . ANGIOPLASTY    . APPENDECTOMY    . CARDIAC CATHETERIZATION    . CHOLECYSTECTOMY    . WRIST SURGERY Right    age 37   Family History:  Family History  Problem Relation Age of Onset  . Stroke Father   . Leukemia Father   . Breast cancer Sister    Family Psychiatric  History: See H&P Social History:  Social History   Substance and Sexual Activity  Alcohol Use Yes   Comment: denies being a daily drinker but did have alcohol today     Social History   Substance and Sexual Activity  Drug Use Yes  . Types: Cocaine, Marijuana   Comment: used cocaine today; denies smoking marijuana for years    Social History   Socioeconomic History  . Marital status: Single    Spouse name: None  . Number of children: None  . Years of education: None  . Highest education level: None  Social Needs  . Financial resource strain: None  . Food insecurity - worry: None  . Food insecurity - inability: None  . Transportation needs - medical: None  . Transportation needs - non-medical: None  Occupational History  . None  Tobacco Use  .  Smoking status: Current Every Day Smoker    Packs/day: 1.00    Types: Cigarettes  . Smokeless tobacco: Never Used  Substance and Sexual Activity  . Alcohol use: Yes    Comment: denies being a daily drinker but did have alcohol today  . Drug use: Yes    Types: Cocaine, Marijuana    Comment: used cocaine today; denies smoking marijuana for years  . Sexual activity: None  Other Topics Concern  . None  Social History Narrative  . None   Additional Social History:                          Sleep: Good  Appetite:  Good  Current Medications: Current Facility-Administered Medications  Medication Dose Route Frequency Provider Last Rate Last Dose  . acetaminophen (TYLENOL) tablet 650 mg  650 mg Oral Q6H PRN [name], [name], MD   650 mg at 11/06/17 0828  . albuterol (PROVENTIL HFA;VENTOLIN HFA) 108 (90 Base) MCG/ACT inhaler 1 puff  1 puff Inhalation Daily PRN McNew, Tyson Babinski, MD   1 puff at 11/12/17 1750  . alum & mag hydroxide-simeth (MAALOX/MYLANTA) 200-200-20 MG/5ML suspension 30 mL  30 mL Oral Q4H PRN [name], [name], MD      . aspirin EC tablet 81 mg  81 mg Oral Daily [name], [name], MD   81 mg at 11/13/17 0850  . atorvastatin (LIPITOR) tablet 40 mg  40 mg Oral q1800 [name], [name], MD   40 mg at 11/12/17 1716  . busPIRone (BUSPAR) tablet 10 mg  10 mg Oral TID [name], [name], MD   10 mg at 11/13/17 1222  . cloNIDine (CATAPRES) tablet 0.1 mg  0.1 mg Oral Q6H PRN Marylin Crosby, MD   0.1 mg at 11/11/17 2142  . folic acid (FOLVITE) tablet 1 mg  1 mg Oral Daily [name], [name], MD   1 mg at 11/13/17 0849  . gabapentin (NEURONTIN) tablet 300 mg  300 mg Oral TID Marylin Crosby, MD   300 mg at 11/13/17 1222  . hydrOXYzine (ATARAX/VISTARIL) tablet 50 mg  50 mg Oral Q6H PRN Lenward Chancellor, MD   50 mg at 11/12/17 2146  . ibuprofen (ADVIL,MOTRIN) tablet 600 mg  600 mg Oral Q6H PRN Lenward Chancellor, MD   600 mg at 11/12/17 1749  . Influenza vac split quadrivalent PF (FLUARIX) injection 0.5 mL  0.5 mL Intramuscular Tomorrow-1000 McNew, Holly R, MD      . lisinopril (PRINIVIL,ZESTRIL) tablet 10 mg  10 mg Oral Daily McNew, Tyson Babinski, MD   10 mg at 11/13/17 0849  . magnesium hydroxide (MILK OF MAGNESIA) suspension 30 mL  30 mL Oral Daily PRN [name], [name], MD      . mirtazapine (REMERON) tablet 15 mg  15 mg Oral QHS McNew, Tyson Babinski, MD   15 mg at 11/12/17 2147  . multivitamin with minerals tablet 1 tablet  1 tablet Oral Daily [name], [name], MD   1  tablet at 11/13/17 0849  . nicotine (NICODERM CQ - dosed in mg/24 hours) patch 21 mg  21 mg Transdermal Daily McNew, Tyson Babinski, MD   21 mg at 11/13/17 0852  . QUEtiapine (SEROQUEL XR) 24 hr tablet 300 mg  300 mg Oral QHS McNew, Tyson Babinski, MD   300 mg at 11/12/17 2147  . QUEtiapine (SEROQUEL) tablet 25 mg  25 mg Oral TID PRN McNew, Tyson Babinski, MD      .  thiamine (VITAMIN B-1) tablet 100 mg  100 mg Oral Daily [name], [name], MD   100 mg at 11/13/17 0856   Or  . thiamine (B-1) injection 100 mg  100 mg Intravenous Daily [name], [name], MD        Lab Results: No results found for this or any previous visit (from the past 48 hour(s)).  Blood Alcohol level:  Lab Results  Component Value Date   ETH 268 (H) 11/03/2017   ETH 171 (H) 16/06/3709    Metabolic Disorder Labs: Lab Results  Component Value Date   HGBA1C 5.5 11/06/2017   MPG 111.15 11/06/2017   MPG 103 01/07/2017   No results found for: PROLACTIN Lab Results  Component Value Date   CHOL 112 11/06/2017   TRIG 98 11/06/2017   HDL 34 (L) 11/06/2017   CHOLHDL 3.3 11/06/2017   VLDL 20 11/06/2017   LDLCALC 58 11/06/2017   LDLCALC 86 01/07/2017    Physical Findings: AIMS: Facial and Oral Movements Muscles of Facial Expression: None, normal Lips and Perioral Area: None, normal Jaw: None, normal Tongue: None, normal,Extremity Movements Upper (arms, wrists, hands, fingers): None, normal Lower (legs, knees, ankles, toes): None, normal, Trunk Movements Neck, shoulders, hips: None, normal, Overall Severity Severity of abnormal movements (highest score from questions above): None, normal Incapacitation due to abnormal movements: None, normal Patient's awareness of abnormal movements (rate only patient's report): No Awareness, Dental Status Current problems with teeth and/or dentures?: No Does patient usually wear dentures?: No  CIWA:  CIWA-Ar Total: 3 COWS:     Musculoskeletal: Strength & Muscle Tone: within normal limits Gait &  Station: normal Patient leans: N/A  Psychiatric Specialty Exam: Physical Exam  Nursing note and vitals reviewed.   Review of Systems  All other systems reviewed and are negative. Mild chest pain earlier in the day  Blood pressure (!) 133/98, pulse 81, temperature 98.9 F (37.2 C), temperature source Oral, resp. rate 18, height 6' (1.829 m), weight 261 lb (118.4 kg), SpO2 99 %.Body mass index is 35.4 kg/m.  General Appearance: Casual  Eye Contact:  Good  Speech:  Clear and Coherent  Volume:  Normal  Mood: A little anxious  Affect: Euthymic  Thought Process:  Goal Directed  Orientation:  Full (Time, Place, and Person)  Thought Content:  Negative  Suicidal Thoughts:  No  Homicidal Thoughts:  No  Memory:  Immediate;   Fair  Judgement:  Fair  Insight:  Fair  Psychomotor Activity:  Normal  Concentration:  Concentration: Fair  Recall:  [employer] of Knowledge:  Fair  Language:  Fair  Akathisia:  No      Assets:  Communication Skills Desire for Improvement  ADL's:  Intact  Cognition:  WNL  Sleep:  Number of Hours: 5.45     Treatment Plan Summary: 56 yo male admitted due to [place]. Anxiety and mood has been improving with medication changes. He feels like the medication regimen he is on now has been very helpful.  Has been anxious about his living situation following discharge.  Was informed on the 22nd that his trailer is not habitable and he would need to look for an alternative living situation.  Social worker spoke with him in detail today and it appears like the only alternative situation available would be living with his friend.  His friend is away on a beach trip and will come back only on Sunday.  Patient continues to do well otherwise and endorses that his  mood is much better and free of depressive symptoms.  Plan: MDD -Continue Remeron 15 mg qhs -Continue lower dose of Seroquel XR 300 mg qhs. Pt has history of torsades. He is aware of risks of QTc prolonging medications  but does not want to stop Seroquel. QTc is normal at 429   Anxiety -Continue Buspar 10 mg TID -Continue Gabapentin 300 mg TID -prn Seroquel and hydroxyzine  HTN -increase Lisinopril to 10 mg daily -Check EKG today-QTc 429 11/11/17 -prn Clonidine  Dispo -Patient was supposed to return to trailer but this will not be possible.  Endorses that his new living arrangement would be available by Sunday.  He will follow up with [place] with [facility] for peer and supportive employment services      Ramond Dial, MD 11/13/2017, 1:29 PM

## 2017-11-13 NOTE — [facility] Group Notes (Signed)
[facility] Group Notes:  (Nursing/MHT/Case Management/Adjunct)  Date:  11/13/2017  Time:  9:26 PM  Type of Therapy:  Group Therapy  Participation Level:  Active  Participation Quality:  Appropriate  Affect:  Appropriate  Cognitive:  Alert  Insight:  Good  Engagement in Group:  Engaged  Modes of Intervention:  Support  Summary of Progress/Problems:  Nehemiah Settle 11/13/2017, 9:26 PM

## 2017-11-13 NOTE — Progress Notes (Signed)
Recreation Therapy Notes  Date: 11.23.18  Time: 1:00 pm  Location: Craft Room  Behavioral response: N/A  Intervention Topic: Communication   Discussion/Intervention: Patient did not attend group. Clinical Observations/Feedback:  Patient did not attend group. Everley Evora LRT/CTRS         Arionna Hoggard 11/13/2017 1:56 PM

## 2017-11-13 NOTE — Progress Notes (Signed)
Visible in the day room, selectively social. Pleasant on contact. Is happy to be leaving Monday and feels ready to go. Denies all psych symptoms. BP 133/85, p 84. No physical complaints. Remains on routine obs for safety.

## 2017-11-13 NOTE — Progress Notes (Signed)
Pleasant on contact, visible in the day room. States he is worried because he has no discharge plan and he is not sure when he can leave. States he is anxious about that and has been taking atarax with good effect. BP tonight was 140/103 p 87. Denies all psych symptoms except anxiety over the DC situation. Remains on routine obs for safety.

## 2017-11-13 NOTE — [facility] Group Notes (Signed)
[facility] LCSW Group Therapy Note  Date/Time: 11/13/17, 0930  Type of Therapy and Topic:  Group Therapy:  Feelings around Relapse and Recovery  Participation Level:  Did Not Attend   Mood:  Description of Group:    Patients in this group will discuss emotions they experience before and after a relapse. They will process how experiencing these feelings, or avoidance of experiencing them, relates to having a relapse. Facilitator will guide patients to explore emotions they have related to recovery. Patients will be encouraged to process which emotions are more powerful. They will be guided to discuss the emotional reaction significant others in their lives may have to patients' relapse or recovery. Patients will be assisted in exploring ways to respond to the emotions of others without this contributing to a relapse.  Therapeutic Goals: 1. Patient will identify two or more emotions that lead to relapse for them:  2. Patient will identify two emotions that result when they relapse:  3. Patient will identify two emotions related to recovery:  4. Patient will demonstrate ability to communicate their needs through discussion and/or role plays.   Summary of Patient Progress:     Therapeutic Modalities:   Cognitive Behavioral Therapy Solution-Focused Therapy Assertiveness Training Relapse Prevention Therapy  Lurline Idol, LCSW

## 2017-11-13 NOTE — Progress Notes (Addendum)
CSW met with pt to discuss newly reported problems with his discharge plan.  Pt was informed that his trailer has been condemned and now does not have a place to stay.  CSW asked pt about any options he has for other housing arrangements.  Pt reports his friend, Dierdre Forth, is out of town until Sunday.  Pt said he will not stay at a shelter.  Pt eventually said that he has neighbors and if we send him back to his trailer park, he will figure it out.  CSW spoke to Edwyna Shell, who asked that we contact [place], which CSW did.  Jocko is at the beach for the weekend and will return on Sunday.  CSW spoke to Dr Davonna Belling, who had spoken to pt after the above conversation with CSW.  Pt was agitated and upset to the point that Dr Davonna Belling did not think it was a good idea to discharge him. CSW spoke again with Jocko Morrison,[phone], who said he can pick pt up on Monday and take him to his 10am [facility] appt.  He will help him figure out what is going on with his trailer as well. Winferd Humphrey, MSW, LCSW Clinical Social Worker 11/13/2017 11:01 AM

## 2017-11-14 DIAGNOSIS — F332 Major depressive disorder, recurrent severe without psychotic features: Secondary | ICD-10-CM

## 2017-11-14 MED ORDER — LISINOPRIL 20 MG PO TABS
20.0000 mg | ORAL_TABLET | Freq: Every day | ORAL | Status: DC
  Administered 2017-11-15 – 2017-11-16 (×2): 20 mg via ORAL
  Filled 2017-11-14 (×2): qty 1

## 2017-11-14 NOTE — Progress Notes (Signed)
[facility] MD Progress Note  11/14/2017 3:37 PM Ian [name]  MRN:  [id number]   Subjective: Patient continues to be anxious about his living situation following discharge.  His blood pressure is high today, 1 value recorded was 190/149 mm Hg.  Patient was given clonidine and blood pressure decreased to 141/97 mm Hg.  Endorses that his mood is much better.  Also endorses a headache.  Denies any suicidal ideation or thoughts about dying. Principal Problem: Severe recurrent major depression without psychotic features ([place]) Diagnosis:   Patient Active Problem List   Diagnosis Date Noted  . Severe recurrent major depression without psychotic features ([place]) [F33.2] 11/05/2017  . Dyslipidemia [E78.5] 01/07/2017  . Barrett's esophagus [K22.70] 01/07/2017  . Bipolar I disorder, most recent episode depressed with anxious distress ([place]) [F31.9] 01/06/2017  . Cannabis use disorder, moderate, dependence ([place]) [F12.20] 01/06/2017  . Tobacco use disorder [F17.200] 01/06/2017  . Alcohol use disorder, moderate, dependence ([place]) [F10.20] 10/06/2016  . Substance induced mood disorder ([place]) [F19.94] 10/06/2016  . Cocaine use disorder, moderate, dependence ([place]) [F14.20] 10/06/2016  . Depression [F32.9] 10/06/2016  . Acute left-sided weakness [M62.89] 05/03/2015  . CVA (cerebral infarction) [I63.9] 05/03/2015  . Weakness [R53.1] 11/16/2013  . Chest pain [R07.9] 11/16/2013  . HTN (hypertension) [I10] 11/16/2013  . Left-sided weakness [R53.1] 11/16/2013  . Torsades de pointes ([place]) [I47.2] 11/16/2013  . Hemiplegia, unspecified, affecting nondominant side [G81.90] 11/15/2013  . Speech and language deficits [F80.9, R47.9] 11/15/2013   Total Time spent with patient: 30 minutes  Past Psychiatric History: See H&P  Past Medical History:  Past Medical History:  Diagnosis Date  . Anxiety   . Barrett esophagus   . Cancer ([place])   . Coronary artery disease   . Depression   . GERD (gastroesophageal reflux  disease)   . Hypertension   . Stroke [facility]) 2014   "mini-stroke" per patient    Past Surgical History:  Procedure Laterality Date  . ANGIOPLASTY    . APPENDECTOMY    . CARDIAC CATHETERIZATION    . CHOLECYSTECTOMY    . WRIST SURGERY Right    age 56   Family History:  Family History  Problem Relation Age of Onset  . Stroke Father   . Leukemia Father   . Breast cancer Sister    Family Psychiatric  History: See H&P Social History:  Social History   Substance and Sexual Activity  Alcohol Use Yes   Comment: denies being a daily drinker but did have alcohol today     Social History   Substance and Sexual Activity  Drug Use Yes  . Types: Cocaine, Marijuana   Comment: used cocaine today; denies smoking marijuana for years    Social History   Socioeconomic History  . Marital status: Single    Spouse name: None  . Number of children: None  . Years of education: None  . Highest education level: None  Social Needs  . Financial resource strain: None  . Food insecurity - worry: None  . Food insecurity - inability: None  . Transportation needs - medical: None  . Transportation needs - non-medical: None  Occupational History  . None  Tobacco Use  . Smoking status: Current Every Day Smoker    Packs/day: 1.00    Types: Cigarettes  . Smokeless tobacco: Never Used  Substance and Sexual Activity  . Alcohol use: Yes    Comment: denies being a daily drinker but did have alcohol today  . Drug use: Yes  Types: Cocaine, Marijuana    Comment: used cocaine today; denies smoking marijuana for years  . Sexual activity: None  Other Topics Concern  . None  Social History Narrative  . None   Additional Social History:                         Sleep: Good  Appetite:  Good  Current Medications: Current Facility-Administered Medications  Medication Dose Route Frequency Provider Last Rate Last Dose  . acetaminophen (TYLENOL) tablet 650 mg  650 mg Oral Q6H PRN  [name], [name], MD   650 mg at 11/06/17 0828  . albuterol (PROVENTIL HFA;VENTOLIN HFA) 108 (90 Base) MCG/ACT inhaler 1 puff  1 puff Inhalation Daily PRN Marylin Crosby, MD   1 puff at 11/14/17 [phone]  . alum & mag hydroxide-simeth (MAALOX/MYLANTA) 200-200-20 MG/5ML suspension 30 mL  30 mL Oral Q4H PRN [name], [name], MD      . aspirin EC tablet 81 mg  81 mg Oral Daily [name], [name], MD   81 mg at 11/14/17 0917  . atorvastatin (LIPITOR) tablet 40 mg  40 mg Oral q1800 [name], [name], MD   40 mg at 11/13/17 1711  . busPIRone (BUSPAR) tablet 10 mg  10 mg Oral TID [name], [name], MD   10 mg at 11/14/17 1152  . cloNIDine (CATAPRES) tablet 0.1 mg  0.1 mg Oral Q6H PRN Marylin Crosby, MD   0.1 mg at 11/14/17 1151  . folic acid (FOLVITE) tablet 1 mg  1 mg Oral Daily [name], [name], MD   1 mg at 11/14/17 0917  . gabapentin (NEURONTIN) tablet 300 mg  300 mg Oral TID Marylin Crosby, MD   300 mg at 11/14/17 1151  . hydrOXYzine (ATARAX/VISTARIL) tablet 50 mg  50 mg Oral Q6H PRN Lenward Chancellor, MD   50 mg at 11/13/17 1717  . ibuprofen (ADVIL,MOTRIN) tablet 600 mg  600 mg Oral Q6H PRN Lenward Chancellor, MD   600 mg at 11/14/17 1151  . Influenza vac split quadrivalent PF (FLUARIX) injection 0.5 mL  0.5 mL Intramuscular Tomorrow-1000 McNew, Holly R, MD      . lisinopril (PRINIVIL,ZESTRIL) tablet 10 mg  10 mg Oral Daily McNew, Tyson Babinski, MD   10 mg at 11/14/17 0913  . magnesium hydroxide (MILK OF MAGNESIA) suspension 30 mL  30 mL Oral Daily PRN [name], [name], MD      . mirtazapine (REMERON) tablet 15 mg  15 mg Oral QHS McNew, Tyson Babinski, MD   15 mg at 11/13/17 2208  . multivitamin with minerals tablet 1 tablet  1 tablet Oral Daily [name], [name], MD   1 tablet at 11/14/17 0915  . nicotine (NICODERM CQ - dosed in mg/24 hours) patch 21 mg  21 mg Transdermal Daily McNew, Tyson Babinski, MD   21 mg at 11/14/17 0914  . QUEtiapine (SEROQUEL XR) 24 hr tablet 300 mg  300 mg Oral QHS McNew, Tyson Babinski, MD   300 mg at 11/13/17 2208   . QUEtiapine (SEROQUEL) tablet 25 mg  25 mg Oral TID PRN McNew, Tyson Babinski, MD      . thiamine (VITAMIN B-1) tablet 100 mg  100 mg Oral Daily [name], [name], MD   100 mg at 11/14/17 6387   Or  . thiamine (B-1) injection 100 mg  100 mg Intravenous Daily [name], [name], MD        Lab Results: No  results found for this or any previous visit (from the past 55 hour(s)).  Blood Alcohol level:  Lab Results  Component Value Date   ETH 268 (H) 11/03/2017   ETH 171 (H) 20/25/4270    Metabolic Disorder Labs: Lab Results  Component Value Date   HGBA1C 5.5 11/06/2017   MPG 111.15 11/06/2017   MPG 103 01/07/2017   No results found for: PROLACTIN Lab Results  Component Value Date   CHOL 112 11/06/2017   TRIG 98 11/06/2017   HDL 34 (L) 11/06/2017   CHOLHDL 3.3 11/06/2017   VLDL 20 11/06/2017   LDLCALC 58 11/06/2017   LDLCALC 86 01/07/2017    Physical Findings: AIMS: Facial and Oral Movements Muscles of Facial Expression: None, normal Lips and Perioral Area: None, normal Jaw: None, normal Tongue: None, normal,Extremity Movements Upper (arms, wrists, hands, fingers): None, normal Lower (legs, knees, ankles, toes): None, normal, Trunk Movements Neck, shoulders, hips: None, normal, Overall Severity Severity of abnormal movements (highest score from questions above): None, normal Incapacitation due to abnormal movements: None, normal Patient's awareness of abnormal movements (rate only patient's report): No Awareness, Dental Status Current problems with teeth and/or dentures?: No Does patient usually wear dentures?: No  CIWA:  CIWA-Ar Total: 6 COWS:     Musculoskeletal: Strength & Muscle Tone: within normal limits Gait & Station: normal Patient leans: N/A  Psychiatric Specialty Exam: Physical Exam  Nursing note and vitals reviewed.   Review of Systems  All other systems reviewed and are negative. Mild chest pain earlier in the day  Blood pressure 140/82, pulse 87,  temperature 98.7 F (37.1 C), temperature source Oral, resp. rate 18, height 6' (1.829 m), weight 261 lb (118.4 kg), SpO2 99 %.Body mass index is 35.4 kg/m.  General Appearance: Casual  Eye Contact:  Good  Speech:  Clear and Coherent  Volume:  Normal  Mood: A little anxious  Affect: Euthymic  Thought Process:  Goal Directed  Orientation:  Full (Time, Place, and Person)  Thought Content:  Negative  Suicidal Thoughts:  No  Homicidal Thoughts:  No  Memory:  Immediate;   Fair  Judgement:  Fair  Insight:  Fair  Psychomotor Activity:  Normal  Concentration:  Concentration: Fair  Recall:  [employer] of Knowledge:  Fair  Language:  Fair  Akathisia:  No      Assets:  Communication Skills Desire for Improvement  ADL's:  Intact  Cognition:  WNL  Sleep:  Number of Hours: 5     Treatment Plan Summary: 56 yo male admitted due to [place]. Anxiety and mood has been improving with medication changes. He feels like the medication regimen he is on now has been very helpful.  Has been anxious about his living situation following discharge.  Was informed on the 22nd that his trailer is not habitable and he would need to look for an alternative living situation.  Social worker spoke with him in detail today and it appears like the only alternative situation available would be living with his friend.  His friend is away on a beach trip and will come back only on Sunday.  Patient continues to do well otherwise and endorses that his mood is much better and free of depressive symptoms.  Plan: MDD -Continue Remeron 15 mg qhs -Continue lower dose of Seroquel XR 300 mg qhs. Pt has history of torsades. He is aware of risks of QTc prolonging medications but does not want to stop Seroquel. QTc is normal at 429  Anxiety -Continue Buspar 10 mg TID -Continue Gabapentin 300 mg TID -prn Seroquel and hydroxyzine  HTN -increase Lisinopril to 20 mg daily -Check EKG-QTc 429 11/11/17 -prn  Clonidine  Dispo -Patient was supposed to return to trailer but this will not be possible.  Endorses that his new living arrangement would be available by Sunday.  He will follow up with [place] with [employer] for peer and supportive employment services      Ramond Dial, MD 11/14/2017, 3:37 PM

## 2017-11-14 NOTE — [facility] Group Notes (Signed)
11/14/2017 1:15pm  Type of Therapy and Topic: Group Therapy: Feelings Around Returning Home & Establishing a Supportive Framework and Supporting Oneself When Supports Not Available  Participation Level: Did Not Attend     Darrol Angel 11/14/2017 12:49 PM

## 2017-11-14 NOTE — [facility] Group Notes (Signed)
[place] Group Notes:  (Nursing/MHT/Case Management/Adjunct)  Date:  11/14/2017  Time:  10:24 PM  Type of Therapy:  Psychoeducational Skills  Participation Level:  Active  Participation Quality:  Appropriate  Affect:  Appropriate  Cognitive:  Appropriate  Insight:  Appropriate  Engagement in Group:  Engaged  Modes of Intervention:  Discussion, Socialization and Support  Summary of Progress/Problems:  Ian [name] 11/14/2017, 10:24 PM

## 2017-11-14 NOTE — Progress Notes (Signed)
Patient pleasant but anxious.  Medication compliant. BP at about 11:30 was elevated(190/149). Manual recheck of the BP was (178/98). Clonidine 0.1mg  PO PRN administered at 1151. Patient was also complaining of headache around the eyes and light headedness. Ibuprofen administered with relief verbalized. MD, Dr. Davonna Belling notified of this findings. VS were rechecked at 1330(BP=140/82, HR=87) and patient denied any other prior symptoms. He was observed mostly in the dayroom interacting with peers. He however did not attend his 1300 group. He denied any thoughts of SI/AVH and contracted for safety.

## 2017-11-14 NOTE — Plan of Care (Signed)
  Coping: Ability to cope will improve 11/14/2017 1137 - Progressing by Micheline Maze, RN  Patient visible in the milieu interacting appropriately with peers, watching tv and reading. Coping: Ability to verbalize feelings will improve 11/14/2017 1137 - Progressing by Micheline Maze, RN  Seen engaging in diversional activities Medication: Compliance with prescribed medication regimen will improve 11/14/2017 1137 - Progressing by Micheline Maze, RN  Pt. medication compliant Safety: Ability to remain free from injury will improve 11/14/2017 1137 - Progressing by Clare Casto, Halford Decamp, RN  No fall events noted.

## 2017-11-15 DIAGNOSIS — F332 Major depressive disorder, recurrent severe without psychotic features: Secondary | ICD-10-CM

## 2017-11-15 MED ORDER — PANTOPRAZOLE SODIUM 40 MG PO TBEC
40.0000 mg | DELAYED_RELEASE_TABLET | Freq: Every day | ORAL | Status: DC
  Administered 2017-11-15 – 2017-11-16 (×2): 40 mg via ORAL
  Filled 2017-11-15 (×2): qty 1

## 2017-11-15 NOTE — Progress Notes (Signed)
D: Patient is an early discharge tomorrow.  He has completed his detox and feel he is ready for discharge.  He is pleasant and has been interacting well with staff and peers.  He has been attending groups.  He denies any thoughts of self harm and does not appear to be responding to internal stimuli.  He states his depressive symptoms have significantly decreased since admission and he is future oriented. A: Continue to monitor medication management and MD orders.  Safety checks completed every 15 minutes per protocol.  Offer support and encouragement as needed. R: Patient is receptive to staff; his behavior is appropriate.

## 2017-11-15 NOTE — [facility] Group Notes (Signed)
11/15/2017 1:15pm  Type of Therapy and Topic: Group Therapy: Holding on to Grudges   Participation Level: Active   Description of Group:  In this group patients will be asked to explore and define a grudge. Patients will be guided to discuss their thoughts, feelings, and reasons as to why people have grudges. Patients will process the impact grudges have on daily life and identify thoughts and feelings related to holding grudges. Facilitator will challenge patients to identify ways to let go of grudges and the benefits this provides. Patients will be confronted to address why one struggles letting go of grudges. Lastly, patients will identify feelings and thoughts related to what life would look like without grudges. This group will be process-oriented, with patients participating in exploration of their own experiences, giving and receiving support, and processing challenge from other group members.  Therapeutic Goals:  1. Patient will identify specific grudges related to their personal life.  2. Patient will identify feelings, thoughts, and beliefs around grudges.  3. Patient will identify how one releases grudges appropriately.  4. Patient will identify situations where they could have let go of the grudge, but instead chose to hold on.   Summary of Patient Progress:  Pt identified specific grudges that are affecting his life. He was able to process his feelings, thoughts and beliefs around grudges. He reports that he has been holding on to grudges for over 30 years. He indicates that he does not think about the way he feels until he sees the person again. Pt indicates that he pushes his feelings inside.       Therapeutic Modalities:  Cognitive Behavioral Therapy  Solution Focused Therapy  Motivational Interviewing  Brief Therapy   Kendria Halberg  CUEBAS-[place]ON, [place] 11/15/2017 11:39 AM

## 2017-11-15 NOTE — Progress Notes (Signed)
[facility] MD Progress Note  11/15/2017 11:37 AM Ian [name]  MRN:  [id number]   Subjective: Patient confirms that his friend would be available for him to go stay with at 10:00 tomorrow.  Is hopeful about leaving the hospital tomorrow.  Denies any depressive symptoms or depressive cognitions.  Denies any suicidal thoughts or thoughts about dying.  Feels happy that he has been able get help while staying here. Principal Problem: Severe recurrent major depression without psychotic features ([place]) Diagnosis:   Patient Active Problem List   Diagnosis Date Noted  . Severe recurrent major depression without psychotic features ([place]) [F33.2] 11/05/2017  . Dyslipidemia [E78.5] 01/07/2017  . Barrett's esophagus [K22.70] 01/07/2017  . Bipolar I disorder, most recent episode depressed with anxious distress ([place]) [F31.9] 01/06/2017  . Cannabis use disorder, moderate, dependence ([place]) [F12.20] 01/06/2017  . Tobacco use disorder [F17.200] 01/06/2017  . Alcohol use disorder, moderate, dependence ([place]) [F10.20] 10/06/2016  . Substance induced mood disorder ([place]) [F19.94] 10/06/2016  . Cocaine use disorder, moderate, dependence ([place]) [F14.20] 10/06/2016  . Depression [F32.9] 10/06/2016  . Acute left-sided weakness [M62.89] 05/03/2015  . CVA (cerebral infarction) [I63.9] 05/03/2015  . Weakness [R53.1] 11/16/2013  . Chest pain [R07.9] 11/16/2013  . HTN (hypertension) [I10] 11/16/2013  . Left-sided weakness [R53.1] 11/16/2013  . Torsades de pointes ([place]) [I47.2] 11/16/2013  . Hemiplegia, unspecified, affecting nondominant side [G81.90] 11/15/2013  . Speech and language deficits [F80.9, R47.9] 11/15/2013   Total Time spent with patient: 30 minutes  Past Psychiatric History: See H&P  Past Medical History:  Past Medical History:  Diagnosis Date  . Anxiety   . Barrett esophagus   . Cancer ([place])   . Coronary artery disease   . Depression   . GERD (gastroesophageal reflux disease)   . Hypertension   .  Stroke [facility]) 2014   "mini-stroke" per patient    Past Surgical History:  Procedure Laterality Date  . ANGIOPLASTY    . APPENDECTOMY    . CARDIAC CATHETERIZATION    . CHOLECYSTECTOMY    . WRIST SURGERY Right    age 54   Family History:  Family History  Problem Relation Age of Onset  . Stroke Father   . Leukemia Father   . Breast cancer Sister    Family Psychiatric  History: See H&P Social History:  Social History   Substance and Sexual Activity  Alcohol Use Yes   Comment: denies being a daily drinker but did have alcohol today     Social History   Substance and Sexual Activity  Drug Use Yes  . Types: Cocaine, Marijuana   Comment: used cocaine today; denies smoking marijuana for years    Social History   Socioeconomic History  . Marital status: Single    Spouse name: None  . Number of children: None  . Years of education: None  . Highest education level: None  Social Needs  . Financial resource strain: None  . Food insecurity - worry: None  . Food insecurity - inability: None  . Transportation needs - medical: None  . Transportation needs - non-medical: None  Occupational History  . None  Tobacco Use  . Smoking status: Current Every Day Smoker    Packs/day: 1.00    Types: Cigarettes  . Smokeless tobacco: Never Used  Substance and Sexual Activity  . Alcohol use: Yes    Comment: denies being a daily drinker but did have alcohol today  . Drug use: Yes    Types: Cocaine, Marijuana  Comment: used cocaine today; denies smoking marijuana for years  . Sexual activity: None  Other Topics Concern  . None  Social History Narrative  . None   Additional Social History:                         Sleep: Good  Appetite:  Good  Current Medications: Current Facility-Administered Medications  Medication Dose Route Frequency Provider Last Rate Last Dose  . acetaminophen (TYLENOL) tablet 650 mg  650 mg Oral Q6H PRN [name], [name], MD   650 mg at  11/06/17 0828  . albuterol (PROVENTIL HFA;VENTOLIN HFA) 108 (90 Base) MCG/ACT inhaler 1 puff  1 puff Inhalation Daily PRN Marylin Crosby, MD   1 puff at 11/14/17 [phone]  . alum & mag hydroxide-simeth (MAALOX/MYLANTA) 200-200-20 MG/5ML suspension 30 mL  30 mL Oral Q4H PRN [name], [name], MD      . aspirin EC tablet 81 mg  81 mg Oral Daily [name], [name], MD   81 mg at 11/15/17 0830  . atorvastatin (LIPITOR) tablet 40 mg  40 mg Oral q1800 [name], [name], MD   40 mg at 11/14/17 1714  . busPIRone (BUSPAR) tablet 10 mg  10 mg Oral TID [name], [name], MD   10 mg at 11/15/17 0831  . cloNIDine (CATAPRES) tablet 0.1 mg  0.1 mg Oral Q6H PRN Marylin Crosby, MD   0.1 mg at 11/14/17 2148  . folic acid (FOLVITE) tablet 1 mg  1 mg Oral Daily [name], [name], MD   1 mg at 11/15/17 0830  . gabapentin (NEURONTIN) tablet 300 mg  300 mg Oral TID Marylin Crosby, MD   300 mg at 11/15/17 0831  . hydrOXYzine (ATARAX/VISTARIL) tablet 50 mg  50 mg Oral Q6H PRN Lenward Chancellor, MD   50 mg at 11/13/17 1717  . ibuprofen (ADVIL,MOTRIN) tablet 600 mg  600 mg Oral Q6H PRN Lenward Chancellor, MD   600 mg at 11/14/17 2138  . Influenza vac split quadrivalent PF (FLUARIX) injection 0.5 mL  0.5 mL Intramuscular Tomorrow-1000 McNew, Holly R, MD      . lisinopril (PRINIVIL,ZESTRIL) tablet 20 mg  20 mg Oral Daily Ramond Dial, MD   20 mg at 11/15/17 0830  . magnesium hydroxide (MILK OF MAGNESIA) suspension 30 mL  30 mL Oral Daily PRN [name], [name], MD      . mirtazapine (REMERON) tablet 15 mg  15 mg Oral QHS McNew, Tyson Babinski, MD   15 mg at 11/14/17 2138  . multivitamin with minerals tablet 1 tablet  1 tablet Oral Daily [name], [name], MD   1 tablet at 11/15/17 0830  . nicotine (NICODERM CQ - dosed in mg/24 hours) patch 21 mg  21 mg Transdermal Daily McNew, Tyson Babinski, MD   21 mg at 11/15/17 0837  . QUEtiapine (SEROQUEL XR) 24 hr tablet 300 mg  300 mg Oral QHS McNew, Tyson Babinski, MD   300 mg at 11/14/17 2138  . QUEtiapine (SEROQUEL)  tablet 25 mg  25 mg Oral TID PRN McNew, Tyson Babinski, MD      . thiamine (VITAMIN B-1) tablet 100 mg  100 mg Oral Daily [name], [name], MD   100 mg at 11/15/17 0830   Or  . thiamine (B-1) injection 100 mg  100 mg Intravenous Daily [name], [name], MD        Lab Results: No results found for this or any previous  visit (from the past 48 hour(s)).  Blood Alcohol level:  Lab Results  Component Value Date   ETH 268 (H) 11/03/2017   ETH 171 (H) 76/16/0737    Metabolic Disorder Labs: Lab Results  Component Value Date   HGBA1C 5.5 11/06/2017   MPG 111.15 11/06/2017   MPG 103 01/07/2017   No results found for: PROLACTIN Lab Results  Component Value Date   CHOL 112 11/06/2017   TRIG 98 11/06/2017   HDL 34 (L) 11/06/2017   CHOLHDL 3.3 11/06/2017   VLDL 20 11/06/2017   LDLCALC 58 11/06/2017   LDLCALC 86 01/07/2017    Physical Findings: AIMS: Facial and Oral Movements Muscles of Facial Expression: None, normal Lips and Perioral Area: None, normal Jaw: None, normal Tongue: None, normal,Extremity Movements Upper (arms, wrists, hands, fingers): None, normal Lower (legs, knees, ankles, toes): None, normal, Trunk Movements Neck, shoulders, hips: None, normal, Overall Severity Severity of abnormal movements (highest score from questions above): None, normal Incapacitation due to abnormal movements: None, normal Patient's awareness of abnormal movements (rate only patient's report): No Awareness, Dental Status Current problems with teeth and/or dentures?: No Does patient usually wear dentures?: No  CIWA:  CIWA-Ar Total: 6 COWS:     Musculoskeletal: Strength & Muscle Tone: within normal limits Gait & Station: normal Patient leans: N/A  Psychiatric Specialty Exam: Physical Exam  Nursing note and vitals reviewed.   Review of Systems  All other systems reviewed and are negative. Mild chest pain earlier in the day  Blood pressure 112/66, pulse 99, temperature 97.7 F (36.5 C),  temperature source Oral, resp. rate 18, height 6' (1.829 m), weight 261 lb (118.4 kg), SpO2 99 %.Body mass index is 35.4 kg/m.  General Appearance: Casual  Eye Contact:  Good  Speech:  Clear and Coherent  Volume:  Normal  Mood: A little anxious  Affect: Euthymic  Thought Process:  Goal Directed  Orientation:  Full (Time, Place, and Person)  Thought Content:  Negative  Suicidal Thoughts:  No  Homicidal Thoughts:  No  Memory:  Immediate;   Fair  Judgement:  Fair  Insight:  Fair  Psychomotor Activity:  Normal  Concentration:  Concentration: Fair  Recall:  [employer] of Knowledge:  Fair  Language:  Fair  Akathisia:  No      Assets:  Communication Skills Desire for Improvement  ADL's:  Intact  Cognition:  WNL  Sleep:  Number of Hours: 6     Treatment Plan Summary: 56 yo male admitted due to [place]. Anxiety and mood has been improving with medication changes. He feels like the medication regimen he is on now has been very helpful.  Has been anxious about his living situation following discharge.  Was informed on the 22nd that his trailer is not habitable and he would need to look for an alternative living situation.  Social worker spoke with him in detail today and it appears like the only alternative situation available would be living with his friend.  His friend is away on a beach trip and will come back only on Sunday.  Patient continues to do well otherwise and endorses that his mood is much better and free of depressive symptoms.  Plan: MDD -Continue Remeron 15 mg qhs -Continue lower dose of Seroquel XR 300 mg qhs. Pt has history of torsades. He is aware of risks of QTc prolonging medications but does not want to stop Seroquel. QTc is normal at 429   Anxiety -Continue Buspar 10 mg  TID -Continue Gabapentin 300 mg TID -prn Seroquel and hydroxyzine  HTN -increase Lisinopril to 20 mg daily -Check EKG-QTc 429 11/11/17 -prn Clonidine  Dispo -Patient was supposed to return to  trailer but this will not be possible.  Endorses that his new living arrangement would be available by Monday.  He will follow up with [place] with [facility] for peer and supportive employment services      Ramond Dial, MD 11/15/2017, 11:37 AM

## 2017-11-15 NOTE — Progress Notes (Signed)
Visible in the milieu, selectively social. Denies all psych symptoms. His main concern is his high BP. At 2100 BP was 155/99. Clonidine 0.1mg  was given. Recheck at 2330 was 126/88. Explained to the pt that increase in lisinopril dose in the AM may be effective. He will need follow up care when he gets home. Remains on routine obs for safety. Appears to be sleeping at this time.

## 2017-11-15 NOTE — Plan of Care (Signed)
  Coping: Ability to cope will improve 11/15/2017 1826 - Progressing by Joice Lofts, RN   Coping: Ability to verbalize feelings will improve 11/15/2017 1826 - Progressing by Joice Lofts, RN   Education: Knowledge of [place] Education information/materials will improve 11/15/2017 1826 - Progressing by Joice Lofts, RN   Education: Emotional status will improve 11/15/2017 1826 - Progressing by Joice Lofts, RN   Adequate for Discharge Education: Knowledge of General Education information will improve 11/15/2017 1826 - Adequate for Discharge by Joice Lofts, RN Health Behavior/Discharge Planning: Ability to manage health-related needs will improve 11/15/2017 1826 - Adequate for Discharge by Joice Lofts, RN   Education: Knowledge of General Education information will improve 11/15/2017 1826 - Adequate for Discharge by Joice Lofts, RN   Health Behavior/Discharge Planning: Ability to manage health-related needs will improve 11/15/2017 1826 - Adequate for Discharge by Joice Lofts, RN

## 2017-11-16 MED ORDER — LISINOPRIL 20 MG PO TABS: 10.0000 mg | ORAL_TABLET | Freq: Every day | ORAL | 0 refills | Status: DC

## 2017-11-16 NOTE — Progress Notes (Signed)
Patient denies SI/HI, denies A/V hallucinations. Patient verbalizes understanding of discharge instructions, follow up care and prescriptions.7 days medicines given to patient. Patient given all belongings from  locker. Patient escorted out by staff, transported by friend.

## 2017-11-16 NOTE — [facility] Suicide Risk Assessment (Signed)
[facility] Discharge Suicide Risk Assessment   Principal Problem: Severe recurrent major depression without psychotic features [facility] Discharge Diagnoses:  Patient Active Problem List   Diagnosis Date Noted  . Severe recurrent major depression without psychotic features ([place]) [F33.2] 11/05/2017  . Dyslipidemia [E78.5] 01/07/2017  . Barrett's esophagus [K22.70] 01/07/2017  . Bipolar I disorder, most recent episode depressed with anxious distress ([place]) [F31.9] 01/06/2017  . Cannabis use disorder, moderate, dependence ([place]) [F12.20] 01/06/2017  . Tobacco use disorder [F17.200] 01/06/2017  . Alcohol use disorder, moderate, dependence ([place]) [F10.20] 10/06/2016  . Substance induced mood disorder ([place]) [F19.94] 10/06/2016  . Cocaine use disorder, moderate, dependence ([place]) [F14.20] 10/06/2016  . Depression [F32.9] 10/06/2016  . Acute left-sided weakness [M62.89] 05/03/2015  . CVA (cerebral infarction) [I63.9] 05/03/2015  . Weakness [R53.1] 11/16/2013  . Chest pain [R07.9] 11/16/2013  . HTN (hypertension) [I10] 11/16/2013  . Left-sided weakness [R53.1] 11/16/2013  . Torsades de pointes ([place]) [I47.2] 11/16/2013  . Hemiplegia, unspecified, affecting nondominant side [G81.90] 11/15/2013  . Speech and language deficits [F80.9, R47.9] 11/15/2013    Total Time spent with patient: 20 minutes  Musculoskeletal: Strength & Muscle Tone: within normal limits Gait & Station: normal Patient leans: N/A  Psychiatric Specialty Exam: ROS  Blood pressure (!) 140/94, pulse 89, temperature (!) 97.4 F (36.3 C), resp. rate 20, height 6' (1.829 m), weight 118.4 kg (261 lb), SpO2 99 %.Body mass index is 35.4 kg/m.    Demographic Factors:  Male and Living alone  Loss Factors: Decrease in vocational status and Decline in physical health  Historical Factors: Prior suicide attempts  Risk Reduction Factors:   Positive social support and Positive coping skills or problem solving skills  Continued Clinical  Symptoms:  None  Cognitive Features That Contribute To Risk:  None    Suicide Risk:  Minimal: No identifiable suicidal ideation.  Patients presenting with no risk factors but with morbid ruminations; may be classified as minimal risk based on the severity of the depressive symptoms  Follow-up Information    [facility] [employer] Follow up on 11/16/2017.   Why:  Patient current w this provider, hospital follow up will be 11/16/17 at 10 AM.  Please call to cancel/reschedule if needed.  Bring hospital discharge paperwork to appointment Contact information: [place] [address] [phone]        [facility] Follow up on 11/17/2017.   Why:  Hospital discharge follow up appointment w new PCP 11/27 at 2:20 w Dr Iona Beard.  Please call to cancel/reschedule if needed. Contact information: [address] [place] [address] Phone: [phone] Fax:         [place] on 11/18/2017.   Why:  7:00am, Sherrian Divers, Peer services, will pick you up and transport you to your appointment.[place] Kitchen  Keep in touch with Sherrian Divers, [phone]. Contact information: [place] [address] [phone]           Plan Of Care/Follow-up recommendations:  Follow up with above appointments  Marylin Crosby, MD 11/16/2017, 9:08 AM

## 2017-11-16 NOTE — Discharge Summary (Signed)
Physician Discharge Summary Note  Patient:  Ian [name] is an 56 y.o., male MRN:  [id number] DO:  08/17/1961 Patient phone:  [phone] (home)  Patient address:   [address] [place] [address],  Total Time spent with patient: 20 minutes  Date of Admission:  11/05/2017 Date of Discharge: 11/16/17  Reason for Admission:  Suicidal thoughts  Principal Problem: Severe recurrent major depression without psychotic features [facility]) Discharge Diagnoses: Patient Active Problem List   Diagnosis Date Noted  . Severe recurrent major depression without psychotic features ([place]) [F33.2] 11/05/2017  . Dyslipidemia [E78.5] 01/07/2017  . Barrett's esophagus [K22.70] 01/07/2017  . Bipolar I disorder, most recent episode depressed with anxious distress ([place]) [F31.9] 01/06/2017  . Cannabis use disorder, moderate, dependence ([place]) [F12.20] 01/06/2017  . Tobacco use disorder [F17.200] 01/06/2017  . Alcohol use disorder, moderate, dependence ([place]) [F10.20] 10/06/2016  . Substance induced mood disorder ([place]) [F19.94] 10/06/2016  . Cocaine use disorder, moderate, dependence ([place]) [F14.20] 10/06/2016  . Depression [F32.9] 10/06/2016  . Acute left-sided weakness [M62.89] 05/03/2015  . CVA (cerebral infarction) [I63.9] 05/03/2015  . Weakness [R53.1] 11/16/2013  . Chest pain [R07.9] 11/16/2013  . HTN (hypertension) [I10] 11/16/2013  . Left-sided weakness [R53.1] 11/16/2013  . Torsades de pointes ([place]) [I47.2] 11/16/2013  . Hemiplegia, unspecified, affecting nondominant side [G81.90] 11/15/2013  . Speech and language deficits [F80.9, R47.9] 11/15/2013    Past Psychiatric History: See H&P  Past Medical History:  Past Medical History:  Diagnosis Date  . Anxiety   . Barrett esophagus   . Cancer ([employer])   . Coronary artery disease   . Depression   . GERD (gastroesophageal reflux disease)   . Hypertension   . Stroke [facility]) 2014   "mini-stroke" per patient    Past Surgical History:   Procedure Laterality Date  . ANGIOPLASTY    . APPENDECTOMY    . CARDIAC CATHETERIZATION    . CHOLECYSTECTOMY    . WRIST SURGERY Right    age 60   Family History:  Family History  Problem Relation Age of Onset  . Stroke Father   . Leukemia Father   . Breast cancer Sister    Family Psychiatric  History: See H&P Social History:  Social History   Substance and Sexual Activity  Alcohol Use Yes   Comment: denies being a daily drinker but did have alcohol today     Social History   Substance and Sexual Activity  Drug Use Yes  . Types: Cocaine, Marijuana   Comment: used cocaine today; denies smoking marijuana for years    Social History   Socioeconomic History  . Marital status: Single    Spouse name: None  . Number of children: None  . Years of education: None  . Highest education level: None  Social Needs  . Financial resource strain: None  . Food insecurity - worry: None  . Food insecurity - inability: None  . Transportation needs - medical: None  . Transportation needs - non-medical: None  Occupational History  . None  Tobacco Use  . Smoking status: Current Every Day Smoker    Packs/day: 1.00    Types: Cigarettes  . Smokeless tobacco: Never Used  Substance and Sexual Activity  . Alcohol use: Yes    Comment: denies being a daily drinker but did have alcohol today  . Drug use: Yes    Types: Cocaine, Marijuana    Comment: used cocaine today; denies smoking marijuana for years  . Sexual activity:  None  Other Topics Concern  . None  Social History Narrative  . None    Hospital Course:  Pt was admitted due to worsening depression, anxiety and SI. Pt was complaining of extreme anxiety and insomnia. It was thought that Wellbutrin may have been causing some anxiety. This was discontinued. He was initially started on Effexor for anxiety and depression. However, BP was elevated and it was felt Effexor may ave been contributing tot his so was discontinued. He was then  started on Remeron for depression and anxiety and he tolerated this well. He was also started on Gabapentin for anxiety and for alcohol use disorder. His home medications of Seroquel XR and Buspar were also started. We discussed his history of torsades and risk of QTc prolonging medications including Seroquel. However, he did not want to stop this medication and states that he is aware of the risks and has been talking to his outpatient doctor about these. He did agree to decreasing dose to 300 mg since he was on Remeron for sleep. He tolerated all these medications well. EKG showed normal QTc through his stay. He attended and participated in all groups. He felt anxiety was significantly improved with medication changes. He met with Lanae Boast with [facility] and plans to continue to meet with him for peer support. On day of discharge, mood was better, anxiety was much improved. He was future oriented. His friend was planning to pick him up and they were going to clean up his trailer so its habitable again. He is looking forward to getting some work again. When asked, he denied SI or thoughts of self harm. Denied HI, AH, VH. He was given 7 day supply of medications from our pharmacy.  The patient is at low risk of imminent suicide. Patient denied thoughts, intent, or plan for harm to self or others, expressed significant future orientation, and expressed an ability to mobilize assistance for his/her needs. he is presently void of any contributing psychiatric symptoms, cognitive difficulties, or substance use which would elevate his risk for lethality. Chronic risk for lethality is elevated in light of poor coping skills, alcohol use disorder (he declined any treatment for substance use)/The chronic risk is presently mitigated by his ongoing desire and engagement in [facility] treatment and mobilization of support from family and friends. Chronic risk may elevate if he experiences any significant loss or worsening of symptoms, which  can be managed and monitored through outpatient providers. At this time,a cute risk for lethality is low and heis stable for ongoing outpatient management.   Modifiable risk factors were addressed during this hospitalization through appropriate pharmacotherapy and establishment of outpatient follow-up treatment. Some risk factors for suicide are situational (i.e. Unstable housing) or related personality pathology (i.e. Poor coping mechanisms) and thus cannot be further mitigated by continued hospitalization in this setting.    Physical Findings: AIMS: Facial and Oral Movements Muscles of Facial Expression: None, normal Lips and Perioral Area: None, normal Jaw: None, normal Tongue: None, normal,Extremity Movements Upper (arms, wrists, hands, fingers): None, normal Lower (legs, knees, ankles, toes): None, normal, Trunk Movements Neck, shoulders, hips: None, normal, Overall Severity Severity of abnormal movements (highest score from questions above): None, normal Incapacitation due to abnormal movements: None, normal Patient's awareness of abnormal movements (rate only patient's report): No Awareness, Dental Status Current problems with teeth and/or dentures?: No Does patient usually wear dentures?: No  CIWA:  CIWA-Ar Total: 6 COWS:     Musculoskeletal: Strength & Muscle Tone: within normal limits  Gait & Station: normal Patient leans: N/A  Psychiatric Specialty Exam: Physical Exam  Nursing note and vitals reviewed.   Review of Systems  All other systems reviewed and are negative.   Blood pressure (!) 140/94, pulse 89, temperature (!) 97.4 F (36.3 C), resp. rate 20, height 6' (1.829 m), weight 118.4 kg (261 lb), SpO2 99 %.Body mass index is 35.4 kg/m.  General Appearance: Casual  Eye Contact:  Good  Speech:  Clear and Coherent  Volume:  Normal  Mood:  Euthymic  Affect:  Congruent  Thought Process:  Goal Directed  Orientation:  Full (Time, Place, and Person)  Thought Content:   Negative  Suicidal Thoughts:  No  Homicidal Thoughts:  No  Memory:  Immediate;   Fair  Judgement:  Fair  Insight:  Fair  Psychomotor Activity:  Normal  Concentration:  Concentration: Fair  Recall:  [place] of Knowledge:  Fair  Language:  Fair  Akathisia:  No      Assets:  Communication Skills Desire for Improvement Social Support  ADL's:  Intact  Cognition:  WNL  Sleep:  Number of Hours: 5.45     Have you used any form of tobacco in the last 30 days? (Cigarettes, Smokeless Tobacco, Cigars, and/or Pipes): Yes  Has this patient used any form of tobacco in the last 30 days? (Cigarettes, Smokeless Tobacco, Cigars, and/or Pipes) Yes, Yes, A prescription for an FDA-approved tobacco cessation medication was offered at discharge and the patient refused  Blood Alcohol level:  Lab Results  Component Value Date   ETH 268 (H) 11/03/2017   ETH 171 (H) 80/02/4916    Metabolic Disorder Labs:  Lab Results  Component Value Date   HGBA1C 5.5 11/06/2017   MPG 111.15 11/06/2017   MPG 103 01/07/2017   No results found for: PROLACTIN Lab Results  Component Value Date   CHOL 112 11/06/2017   TRIG 98 11/06/2017   HDL 34 (L) 11/06/2017   CHOLHDL 3.3 11/06/2017   VLDL 20 11/06/2017   LDLCALC 58 11/06/2017   [place] 86 01/07/2017    See Psychiatric Specialty Exam and Suicide Risk Assessment completed by Attending Physician prior to discharge.  Discharge destination:  Home  Is patient on multiple antipsychotic therapies at discharge:  No   Has Patient had three or more failed trials of antipsychotic monotherapy by history:  No  Recommended Plan for Multiple Antipsychotic Therapies: NA  Discharge Instructions    Increase activity slowly   Complete by:  As directed      Allergies as of 11/16/2017      Reactions   Fluoxetine Swelling   Anxiety, itching   Hydrocodone-acetaminophen Itching      Medication List    STOP taking these medications   buPROPion 150 MG 24 hr  tablet Commonly known as:  WELLBUTRIN XL   calcium carbonate 500 MG chewable tablet Commonly known as:  TUMS   Melatonin 5 MG Tabs     TAKE these medications     Indication  aspirin 81 MG EC tablet Take 1 tablet (81 mg total) by mouth daily.  Indication:  Inflammation   atorvastatin 40 MG tablet Commonly known as:  LIPITOR Take 1 tablet (40 mg total) by mouth daily at 6 PM.  Indication:  High Amount of Fats in the Blood   busPIRone 10 MG tablet Commonly known as:  BUSPAR Take 1 tablet (10 mg total) by mouth 3 (three) times daily. What changed:  when to take this  Indication:  Anxiety Disorder   gabapentin 300 MG capsule Commonly known as:  NEURONTIN Take 1 capsule (300 mg total) by mouth 3 (three) times daily.  Indication:  Neuropathic Pain, anxiety   hydrOXYzine 50 MG tablet Commonly known as:  ATARAX/VISTARIL Take 1 tablet (50 mg total) by mouth every 6 (six) hours as needed for anxiety.  Indication:  Feeling Anxious   lisinopril 20 MG tablet Commonly known as:  PRINIVIL,ZESTRIL Take 0.5 tablets (10 mg total) by mouth daily. What changed:  medication strength  Indication:  High Blood Pressure Disorder   mirtazapine 15 MG tablet Commonly known as:  REMERON Take 1 tablet (15 mg total) by mouth at bedtime.  Indication:  Major Depressive Disorder   QUEtiapine 300 MG 24 hr tablet Commonly known as:  SEROQUEL XR Take 1 tablet (300 mg total) by mouth at bedtime. What changed:  Another medication with the same name was removed. Continue taking this medication, and follow the directions you see here.  Indication:  Manic-Depression, Major Depressive Disorder      Follow-up Information    Pc, [facility] Follow up on 11/16/2017.   Why:  Patient current w this provider, hospital follow up will be 11/16/17 at 10 AM.  Please call to cancel/reschedule if needed.  Bring hospital discharge paperwork to appointment Contact information: [place] [address] [phone]        [facility] Follow up on 11/17/2017.   Why:  Hospital discharge follow up appointment w new PCP 11/27 at 2:20 w Dr Iona Beard.  Please call to cancel/reschedule if needed. Contact information: [address] [place] [address] Phone: [phone] Fax:         [place] on 11/18/2017.   Why:  7:00am, Sherrian Divers, Peer services, will pick you up and transport you to your appointment.Marland Kitchen  Keep in touch with Sherrian Divers, [phone]. Contact information: [place] [address] [phone]           Follow-up recommendations: Follow up with Ellender Hose and PCP Signed: Marylin Crosby, MD 11/16/2017, 9:17 AM

## 2017-11-16 NOTE — Progress Notes (Signed)
Out in the day room all shift. Denies psych symptoms. Is excited about discharge in the AM. Was med compliant. No physical issues. Remains on routine obs for safety.

## 2017-11-16 NOTE — Progress Notes (Signed)
  [facility] Adult Case Management Discharge Plan :  Will you be returning to the same living situation after discharge:  Yes,    At discharge, do you have transportation home?: Yes,    Do you have the ability to pay for your medications: Yes,     Release of information consent forms completed and in the chart;  Patient's signature needed at discharge.  Patient to Follow up at: Follow-up Information    Pc, [facility] Follow up on 11/16/2017.   Why:  Patient current w this provider, hospital follow up will be 11/16/17 at 10 AM.  Please call to cancel/reschedule if needed.  Bring hospital discharge paperwork to appointment Contact information: [place] [address] [phone]        [facility] Follow up on 11/17/2017.   Why:  Hospital discharge follow up appointment w new PCP 11/27 at 2:20 w Dr Iona Beard.  Please call to cancel/reschedule if needed. Contact information: [address] [place] [address] Phone: [phone] Fax:         [place] on 11/18/2017.   Why:  7:00am, Sherrian Divers, Peer services, will pick you up and transport you to your appointment.Marland Kitchen  Keep in touch with Sherrian Divers, [phone]. Contact information: [place] [address] [phone]           Next level of care provider has access to [place] and Suicide Prevention discussed: Yes,     Have you used any form of tobacco in the last 30 days? (Cigarettes, Smokeless Tobacco, Cigars, and/or Pipes): Yes  Has patient been referred to the Quitline?: Patient refused referral  Patient has been referred for addiction treatment: Pt. refused referral  August Saucer, LCSW 11/16/2017, 2:36 PM

## 2017-11-16 NOTE — Progress Notes (Signed)
Recreation Therapy Notes  INPATIENT RECREATION TR PLAN  Patient Details Name: Ian [name] MRN: [id number] DOB: 1960/12/29 Today's Date: 11/16/2017  Rec Therapy Plan Is patient appropriate for Therapeutic Recreation?: Yes Treatment times per week: at least 3 Estimated Length of Stay: 5-7 days TR Treatment/Interventions: Group participation (Comment)(Appropriate participation in recreation therapy tx.)  Discharge Criteria Pt will be discharged from therapy if:: Discharged Treatment plan/goals/alternatives discussed and agreed upon by:: Patient/family  Discharge Summary Short term goals set: Patient will identify 3 positive coping skills to decrease depressive symptoms x5 days.  Short term goals met: Complete Progress toward goals comments: Groups attended Which groups?: Leisure education, Coping skills(Team building) Reason goals not met: N/A Therapeutic equipment acquired: N/A Reason patient discharged from therapy: Discharge from hospital Pt/family agrees with progress & goals achieved: Yes Date patient discharged from therapy: 11/16/17   [name] 11/16/2017, 10:17 AM

## 2017-12-13 DIAGNOSIS — F332 Major depressive disorder, recurrent severe without psychotic features: Secondary | ICD-10-CM

## 2018-01-27 ENCOUNTER — Other Ambulatory Visit: Payer: Self-pay | Admitting: Family Medicine

## 2018-01-27 ENCOUNTER — Ambulatory Visit
Admission: RE | Admit: 2018-01-27 | Discharge: 2018-01-27 | Disposition: A | Discharge status: Home / Self Care | Payer: Medicaid Other | Source: Ambulatory Visit | Attending: Family Medicine | Admitting: Family Medicine

## 2018-01-27 DIAGNOSIS — B182 Chronic viral hepatitis C: Secondary | ICD-10-CM | POA: Diagnosis present

## 2018-01-27 DIAGNOSIS — M545 Low back pain, unspecified: Secondary | ICD-10-CM

## 2018-01-27 DIAGNOSIS — G8929 Other chronic pain: Secondary | ICD-10-CM

## 2018-01-27 DIAGNOSIS — M47817 Spondylosis without myelopathy or radiculopathy, lumbosacral region: Secondary | ICD-10-CM | POA: Insufficient documentation

## 2018-05-04 ENCOUNTER — Observation Stay (HOSPITAL_COMMUNITY)
Admission: EM | Admit: 2018-05-04 | Discharge: 2018-05-05 | Disposition: A | Discharge status: Home / Self Care | Payer: Medicaid Other | Attending: Family Medicine | Admitting: Family Medicine

## 2018-05-04 ENCOUNTER — Observation Stay (HOSPITAL_COMMUNITY): Payer: Medicaid Other

## 2018-05-04 ENCOUNTER — Emergency Department (HOSPITAL_COMMUNITY): Payer: Medicaid Other

## 2018-05-04 ENCOUNTER — Encounter (HOSPITAL_COMMUNITY): Payer: Self-pay | Admitting: Emergency Medicine

## 2018-05-04 DIAGNOSIS — F10129 Alcohol abuse with intoxication, unspecified: Secondary | ICD-10-CM

## 2018-05-04 DIAGNOSIS — F17210 Nicotine dependence, cigarettes, uncomplicated: Secondary | ICD-10-CM | POA: Diagnosis not present

## 2018-05-04 DIAGNOSIS — F1490 Cocaine use, unspecified, uncomplicated: Secondary | ICD-10-CM | POA: Diagnosis not present

## 2018-05-04 DIAGNOSIS — R079 Chest pain, unspecified: Secondary | ICD-10-CM

## 2018-05-04 DIAGNOSIS — Z8673 Personal history of transient ischemic attack (TIA), and cerebral infarction without residual deficits: Secondary | ICD-10-CM | POA: Diagnosis not present

## 2018-05-04 DIAGNOSIS — F17200 Nicotine dependence, unspecified, uncomplicated: Secondary | ICD-10-CM | POA: Diagnosis present

## 2018-05-04 DIAGNOSIS — I2510 Atherosclerotic heart disease of native coronary artery without angina pectoris: Secondary | ICD-10-CM | POA: Insufficient documentation

## 2018-05-04 DIAGNOSIS — I10 Essential (primary) hypertension: Secondary | ICD-10-CM | POA: Diagnosis not present

## 2018-05-04 DIAGNOSIS — Z79899 Other long term (current) drug therapy: Secondary | ICD-10-CM | POA: Diagnosis not present

## 2018-05-04 DIAGNOSIS — F329 Major depressive disorder, single episode, unspecified: Secondary | ICD-10-CM | POA: Diagnosis present

## 2018-05-04 DIAGNOSIS — Z7982 Long term (current) use of aspirin: Secondary | ICD-10-CM | POA: Insufficient documentation

## 2018-05-04 DIAGNOSIS — F1420 Cocaine dependence, uncomplicated: Secondary | ICD-10-CM | POA: Diagnosis not present

## 2018-05-04 DIAGNOSIS — F1020 Alcohol dependence, uncomplicated: Secondary | ICD-10-CM | POA: Diagnosis present

## 2018-05-04 DIAGNOSIS — D72829 Elevated white blood cell count, unspecified: Secondary | ICD-10-CM | POA: Diagnosis not present

## 2018-05-04 DIAGNOSIS — E785 Hyperlipidemia, unspecified: Secondary | ICD-10-CM

## 2018-05-04 DIAGNOSIS — R531 Weakness: Secondary | ICD-10-CM | POA: Diagnosis not present

## 2018-05-04 DIAGNOSIS — F32A Depression, unspecified: Secondary | ICD-10-CM | POA: Diagnosis present

## 2018-05-04 HISTORY — DX: Prediabetes: R73.03

## 2018-05-04 LAB — URINALYSIS, ROUTINE W REFLEX MICROSCOPIC
Bilirubin Urine: NEGATIVE
Glucose, UA: NEGATIVE mg/dL
Hgb urine dipstick: NEGATIVE
KETONES UR: NEGATIVE mg/dL
LEUKOCYTES UA: NEGATIVE
NITRITE: NEGATIVE
PH: 5 (ref 5.0–8.0)
Protein, ur: NEGATIVE mg/dL
Specific Gravity, Urine: 1.01 (ref 1.005–1.030)

## 2018-05-04 LAB — RAPID URINE DRUG SCREEN, HOSP PERFORMED
Amphetamines: NOT DETECTED
Barbiturates: NOT DETECTED
Benzodiazepines: NOT DETECTED
Cocaine: POSITIVE — AB
OPIATES: NOT DETECTED
Tetrahydrocannabinol: NOT DETECTED

## 2018-05-04 LAB — I-STAT CHEM 8, ED
BUN: 11 mg/dL (ref 6–20)
CHLORIDE: 104 mmol/L (ref 101–111)
CREATININE: 1 mg/dL (ref 0.61–1.24)
Calcium, Ion: 1.16 mmol/L (ref 1.15–1.40)
Glucose, Bld: 104 mg/dL — ABNORMAL HIGH (ref 65–99)
HEMATOCRIT: 54 % — AB (ref 39.0–52.0)
Hemoglobin: 18.4 g/dL — ABNORMAL HIGH (ref 13.0–17.0)
POTASSIUM: 4.2 mmol/L (ref 3.5–5.1)
SODIUM: 140 mmol/L (ref 135–145)
TCO2: 22 mmol/L (ref 22–32)

## 2018-05-04 LAB — DIFFERENTIAL
Abs Immature Granulocytes: 0.1 10*3/uL (ref 0.0–0.1)
BASOS ABS: 0.1 10*3/uL (ref 0.0–0.1)
Basophils Relative: 0 %
EOS ABS: 0.4 10*3/uL (ref 0.0–0.7)
EOS PCT: 3 %
Immature Granulocytes: 0 %
LYMPHS ABS: 5.1 10*3/uL — AB (ref 0.7–4.0)
LYMPHS PCT: 35 %
MONO ABS: 1 10*3/uL (ref 0.1–1.0)
Monocytes Relative: 7 %
Neutro Abs: 8 10*3/uL — ABNORMAL HIGH (ref 1.7–7.7)
Neutrophils Relative %: 55 %

## 2018-05-04 LAB — LACTIC ACID, PLASMA: LACTIC ACID, VENOUS: 1.5 mmol/L (ref 0.5–1.9)

## 2018-05-04 LAB — CBC
HEMATOCRIT: 52.5 % — AB (ref 39.0–52.0)
Hemoglobin: 17.4 g/dL — ABNORMAL HIGH (ref 13.0–17.0)
MCH: 30.2 pg (ref 26.0–34.0)
MCHC: 33.1 g/dL (ref 30.0–36.0)
MCV: 91 fL (ref 78.0–100.0)
Platelets: 191 10*3/uL (ref 150–400)
RBC: 5.77 MIL/uL (ref 4.22–5.81)
RDW: 13 % (ref 11.5–15.5)
WBC: 14.7 10*3/uL — ABNORMAL HIGH (ref 4.0–10.5)

## 2018-05-04 LAB — COMPREHENSIVE METABOLIC PANEL
ALK PHOS: 76 U/L (ref 38–126)
ALT: 96 U/L — ABNORMAL HIGH (ref 17–63)
ANION GAP: 12 (ref 5–15)
AST: 62 U/L — ABNORMAL HIGH (ref 15–41)
Albumin: 3.8 g/dL (ref 3.5–5.0)
BUN: 10 mg/dL (ref 6–20)
CALCIUM: 9.2 mg/dL (ref 8.9–10.3)
CO2: 21 mmol/L — ABNORMAL LOW (ref 22–32)
Chloride: 107 mmol/L (ref 101–111)
Creatinine, Ser: 0.7 mg/dL (ref 0.61–1.24)
GFR calc non Af Amer: >60 mL/min (ref >60)
Glucose, Bld: 106 mg/dL — ABNORMAL HIGH (ref 65–99)
POTASSIUM: 4.4 mmol/L (ref 3.5–5.1)
Sodium: 140 mmol/L (ref 135–145)
Total Bilirubin: 0.4 mg/dL (ref 0.3–1.2)
Total Protein: 7.1 g/dL (ref 6.5–8.1)

## 2018-05-04 LAB — I-STAT TROPONIN, ED: TROPONIN I, POC: 0 ng/mL (ref 0.00–0.08)

## 2018-05-04 LAB — ETHANOL: Alcohol, Ethyl (B): 205 mg/dL — ABNORMAL HIGH (ref <10)

## 2018-05-04 LAB — TROPONIN I: Troponin I: <0.03 ng/mL (ref <0.03)

## 2018-05-04 LAB — APTT: APTT: 31 s (ref 24–36)

## 2018-05-04 LAB — PROTIME-INR
INR: 1.04
PROTHROMBIN TIME: 13.5 s (ref 11.4–15.2)

## 2018-05-04 LAB — LIPASE, BLOOD: LIPASE: 29 U/L (ref 11–51)

## 2018-05-04 MED ORDER — ONDANSETRON HCL 4 MG/2ML IJ SOLN: 4.0000 mg | Freq: Three times a day (TID) | INTRAMUSCULAR | Status: DC | PRN

## 2018-05-04 MED ORDER — ASPIRIN 300 MG RE SUPP
300.0000 mg | Freq: Every day | RECTAL | Status: DC
  Filled 2018-05-04: qty 1

## 2018-05-04 MED ORDER — ASPIRIN EC 325 MG PO TBEC: 325.0000 mg | DELAYED_RELEASE_TABLET | Freq: Once | ORAL | Status: DC

## 2018-05-04 MED ORDER — ACETAMINOPHEN 650 MG RE SUPP: 650.0000 mg | Freq: Four times a day (QID) | RECTAL | Status: DC | PRN

## 2018-05-04 MED ORDER — ADULT MULTIVITAMIN W/MINERALS CH
1.0000 | ORAL_TABLET | Freq: Every day | ORAL | Status: DC
  Administered 2018-05-05: 1 via ORAL
  Filled 2018-05-04: qty 1

## 2018-05-04 MED ORDER — NICOTINE 21 MG/24HR TD PT24
21.0000 mg | MEDICATED_PATCH | Freq: Every day | TRANSDERMAL | Status: DC
  Administered 2018-05-04 – 2018-05-05 (×2): 21 mg via TRANSDERMAL
  Filled 2018-05-04 (×2): qty 1

## 2018-05-04 MED ORDER — THIAMINE HCL 100 MG/ML IJ SOLN
100.0000 mg | Freq: Every day | INTRAMUSCULAR | Status: DC
  Administered 2018-05-04: 100 mg via INTRAVENOUS
  Filled 2018-05-04 (×2): qty 2

## 2018-05-04 MED ORDER — IOPAMIDOL (ISOVUE-370) INJECTION 76%
100.0000 mL | Freq: Once | INTRAVENOUS | Status: AC | PRN
  Administered 2018-05-04: 100 mL via INTRAVENOUS

## 2018-05-04 MED ORDER — LORAZEPAM 2 MG/ML IJ SOLN
0.0000 mg | Freq: Four times a day (QID) | INTRAMUSCULAR | Status: DC
  Administered 2018-05-05: 2 mg via INTRAVENOUS
  Administered 2018-05-05: 1 mg via INTRAVENOUS
  Administered 2018-05-05: 2 mg via INTRAVENOUS
  Filled 2018-05-04 (×4): qty 1

## 2018-05-04 MED ORDER — FAMOTIDINE IN NACL 20-0.9 MG/50ML-% IV SOLN
20.0000 mg | Freq: Two times a day (BID) | INTRAVENOUS | Status: DC
  Administered 2018-05-05 (×2): 20 mg via INTRAVENOUS
  Filled 2018-05-04: qty 50

## 2018-05-04 MED ORDER — FOLIC ACID 1 MG PO TABS
1.0000 mg | ORAL_TABLET | Freq: Every day | ORAL | Status: DC
  Administered 2018-05-05: 1 mg via ORAL
  Filled 2018-05-04: qty 1

## 2018-05-04 MED ORDER — ALBUTEROL SULFATE (2.5 MG/3ML) 0.083% IN NEBU
2.5000 mg | INHALATION_SOLUTION | RESPIRATORY_TRACT | Status: DC | PRN
  Administered 2018-05-05: 2.5 mg via RESPIRATORY_TRACT
  Filled 2018-05-04: qty 3

## 2018-05-04 MED ORDER — SODIUM CHLORIDE 0.9 % IV SOLN
INTRAVENOUS | Status: DC
  Administered 2018-05-05 (×2): via INTRAVENOUS

## 2018-05-04 MED ORDER — LORAZEPAM 1 MG PO TABS: 1.0000 mg | ORAL_TABLET | Freq: Four times a day (QID) | ORAL | Status: DC | PRN

## 2018-05-04 MED ORDER — HYDRALAZINE HCL 20 MG/ML IJ SOLN: 5.0000 mg | INTRAMUSCULAR | Status: DC | PRN

## 2018-05-04 MED ORDER — SODIUM CHLORIDE 0.9 % IV BOLUS
2000.0000 mL | Freq: Once | INTRAVENOUS | Status: AC
  Administered 2018-05-04: 2000 mL via INTRAVENOUS

## 2018-05-04 MED ORDER — LORAZEPAM 2 MG/ML IJ SOLN
1.0000 mg | Freq: Four times a day (QID) | INTRAMUSCULAR | Status: DC | PRN
  Administered 2018-05-05: 1 mg via INTRAVENOUS

## 2018-05-04 MED ORDER — ENOXAPARIN SODIUM 40 MG/0.4ML ~~LOC~~ SOLN
40.0000 mg | SUBCUTANEOUS | Status: DC
  Administered 2018-05-05: 40 mg via SUBCUTANEOUS
  Filled 2018-05-04: qty 0.4

## 2018-05-04 MED ORDER — VITAMIN B-1 100 MG PO TABS
100.0000 mg | ORAL_TABLET | Freq: Every day | ORAL | Status: DC
  Administered 2018-05-05: 100 mg via ORAL
  Filled 2018-05-04: qty 1

## 2018-05-04 MED ORDER — LORAZEPAM 2 MG/ML IJ SOLN: 0.0000 mg | Freq: Two times a day (BID) | INTRAMUSCULAR | Status: DC

## 2018-05-04 MED ORDER — STROKE: EARLY STAGES OF RECOVERY BOOK
Freq: Once | Status: AC
  Administered 2018-05-05: 01:00:00
  Filled 2018-05-04: qty 1

## 2018-05-04 NOTE — H&P (Signed)
History and Physical    Ian [name]:[id number] DOB: [birth year] DOA: 05/04/2018  Referring MD/NP/PA:   PCP: Zeb Comfort, MD   Patient coming from:  The patient is coming from home.  At baseline, pt is independent for most of ADL.   Chief Complaint: left sided weakness, difficult speaking, chest pain, AMS  HPI: Ian [name] is a 57 y.o. male with medical history significant of hypertension, hyperlipidemia, stroke, GERD, depression, anxiety, bipolar disorder, CAD, polysubstance abuse (alcohol, cocaine, tobacco and marijuana), who presents with left-sided weakness, difficulty speaking, AMS and chest pain.  Patient is a very poor historian, history is limited. Pt complains of left sided weakness, not moving his left arm and leg. He has difficulty speaking and agitation.  It is unclear of the time of onset.  Facial droop. He states that he used cocaine yesterday, has been drinking alcohol today. He  also complains of chest pain.  It is located in the substernal area.  patient cannot provide a detailed information to characterize his chest pain.  He states that he has cough and mild shortness of breath.  No tenderness in the calf areas.  He feels nauseated, but no active vomiting or diarrhea noted.  He denies abdominal pain.  No symptoms of UTI.  No fever or chills.  ED Course: pt was found to have WBC 14.7, INR 1.04, PTT 31, negative urinalysis, electrolytes renal function okay, temperature normal, no tachycardia, oxygen saturation 94% on room air. CXR negative. CT head is negative for acute intracranial abnormalities.  CT angiogram of head and neck is negative.  CT angiogram of cerebral perfusion negative.  Patient is placed on telemetry bed for observation.  Dr. Leonel Ramsay of neurology was consulted. Pt failed swallowing screen in ED.  Review of Systems:   General: no fevers, chills, no body weight gain, fatigue HEENT: no blurry vision, hearing changes or sore throat Respiratory:  has dyspnea, coughing, no wheezing CV: has chest pain, no palpitations GI: has nausea, no vomiting, abdominal pain, diarrhea, constipation GU: no dysuria, burning on urination, increased urinary frequency, hematuria  Ext: no leg edema Neuro: has left sided weakness and difficult speaking. Skin: no rash, no skin tear. MSK: No muscle spasm, no deformity, no limitation of range of movement in spin Heme: No easy bruising.  Travel history: No recent long distant travel.  Allergy:  Allergies  Allergen Reactions  . Fluoxetine Swelling    Anxiety, itching  . Hydrocodone-Acetaminophen Itching  . Mirtazapine Other (See Comments)    nightmares  . Gabapentin Other (See Comments)    Bad dreams and felt bad  . Tramadol Other (See Comments)    Past Medical History:  Diagnosis Date  . Anxiety   . Barrett esophagus   . Cancer ([place])   . Coronary artery disease   . Depression   . GERD (gastroesophageal reflux disease)   . Hypertension   . Stroke [facility]) 2014   "mini-stroke" per patient    Past Surgical History:  Procedure Laterality Date  . ANGIOPLASTY    . APPENDECTOMY    . CARDIAC CATHETERIZATION    . CHOLECYSTECTOMY    . WRIST SURGERY Right    age 62    Social History:  reports that he has been smoking cigarettes.  He has been smoking about 1.00 pack per day. He has never used smokeless tobacco. He reports that he drinks alcohol. He reports that he has current or past drug history. Drugs: Cocaine and Marijuana.  Family  History:  Family History  Problem Relation Age of Onset  . Stroke Father   . Leukemia Father   . Breast cancer Sister      Prior to Admission medications   Medication Sig Start Date End Date Taking? Authorizing Provider  aspirin 81 MG EC tablet Take 1 tablet (81 mg total) by mouth daily. 11/11/17   McNew, Tyson Babinski, MD  atorvastatin (LIPITOR) 40 MG tablet Take 1 tablet (40 mg total) by mouth daily at 6 PM. 11/11/17 12/11/17  McNew, Tyson Babinski, MD  busPIRone  (BUSPAR) 10 MG tablet Take 1 tablet (10 mg total) by mouth 3 (three) times daily. 11/11/17   McNew, Tyson Babinski, MD  gabapentin (NEURONTIN) 300 MG capsule Take 1 capsule (300 mg total) by mouth 3 (three) times daily. 11/11/17 11/11/18  McNew, Tyson Babinski, MD  hydrOXYzine (ATARAX/VISTARIL) 50 MG tablet Take 1 tablet (50 mg total) by mouth every 6 (six) hours as needed for anxiety. 11/11/17   McNew, Tyson Babinski, MD  lisinopril (PRINIVIL,ZESTRIL) 20 MG tablet Take 0.5 tablets (10 mg total) by mouth daily. 11/16/17   McNew, Tyson Babinski, MD  mirtazapine (REMERON) 15 MG tablet Take 1 tablet (15 mg total) by mouth at bedtime. 11/11/17   McNew, Tyson Babinski, MD  QUEtiapine (SEROQUEL XR) 300 MG 24 hr tablet Take 1 tablet (300 mg total) by mouth at bedtime. 11/11/17   Marylin Crosby, MD    Physical Exam: Vitals:   05/04/18 2114 05/04/18 2115 05/04/18 2233 05/04/18 2300  BP:  131/79 131/79 (!) 135/98  Pulse:  81 80 80  Resp:  (!) 24  (!) 22  Temp:    97.6 F (36.4 C)  TempSrc:    Axillary  SpO2:  93%  99%  Weight: 127 kg (279 lb 15.8 oz)   127 kg (279 lb 15.8 oz)  Height:    6' (1.829 m)   General: Not in acute distress.  Poor hygiene. HEENT:       Eyes: PERRL, EOMI, no scleral icterus.       ENT: No discharge from the ears and nose, no pharynx injection, no tonsillar enlargement.        Neck: No JVD, no bruit, no mass felt. Heme: No neck lymph node enlargement. Cardiac: S1/S2, RRR, No murmurs, No gallops or rubs. Respiratory: No rales, wheezing, rhonchi or rubs. GI: Soft, nondistended, nontender, no rebound pain, no organomegaly, BS present. GU: No hematuria Ext: No pitting leg edema bilaterally. 2+DP/PT pulse bilaterally. Musculoskeletal: No joint deformities, No joint redness or warmth, no limitation of ROM in spin. Skin: No rashes.  Neuro: Alert, disoriented, cranial nerves II-XII grossly intact. Not moving left arm and leg. Muscle strength 5/5 in right extremities, sensation to light touch intact. Brachial  reflex 2+ bilaterally. Negative Babinski's sign. Psych: Patient is not psychotic, no suicidal or hemocidal ideation.  Labs on Admission: I have personally reviewed following labs and imaging studies  CBC: Recent Labs  Lab 05/04/18 1949 05/04/18 1955  WBC 14.7*  --   NEUTROABS 8.0*  --   HGB 17.4* 18.4*  HCT 52.5* 54.0*  MCV 91.0  --   PLT 191  --    Basic Metabolic Panel: Recent Labs  Lab 05/04/18 1949 05/04/18 1955  NA 140 140  K 4.4 4.2  CL 107 104  CO2 21*  --   GLUCOSE 106* 104*  BUN 10 11  CREATININE 0.70 1.00  CALCIUM 9.2  --    GFR: Estimated Creatinine Clearance:  112.3 mL/min (by C-G formula based on SCr of 1 mg/dL). Liver Function Tests: Recent Labs  Lab 05/04/18 1949  AST 62*  ALT 96*  ALKPHOS 76  BILITOT 0.4  PROT 7.1  ALBUMIN 3.8   Recent Labs  Lab 05/04/18 2233  LIPASE 29   No results for input(s): AMMONIA in the last 168 hours. Coagulation Profile: Recent Labs  Lab 05/04/18 1949  INR 1.04   Cardiac Enzymes: Recent Labs  Lab 05/04/18 2233  TROPONINI <0.03   BNP (last 3 results) No results for input(s): PROBNP in the last 8760 hours. HbA1C: No results for input(s): HGBA1C in the last 72 hours. CBG: No results for input(s): GLUCAP in the last 168 hours. Lipid Profile: No results for input(s): CHOL, HDL, LDLCALC, TRIG, CHOLHDL, LDLDIRECT in the last 72 hours. Thyroid Function Tests: No results for input(s): TSH, T4TOTAL, FREET4, T3FREE, THYROIDAB in the last 72 hours. Anemia Panel: No results for input(s): VITAMINB12, FOLATE, FERRITIN, TIBC, IRON, RETICCTPCT in the last 72 hours. Urine analysis:    Component Value Date/Time   COLORURINE STRAW (A) 05/04/2018 2025   APPEARANCEUR CLEAR 05/04/2018 2025   APPEARANCEUR Clear 07/08/2014 0522   LABSPEC 1.010 05/04/2018 2025   LABSPEC 1.008 07/08/2014 0522   PHURINE 5.0 05/04/2018 2025   GLUCOSEU NEGATIVE 05/04/2018 2025   GLUCOSEU Negative 07/08/2014 0522   HGBUR NEGATIVE  05/04/2018 2025   BILIRUBINUR NEGATIVE 05/04/2018 2025   BILIRUBINUR Negative 07/08/2014 0522   KETONESUR NEGATIVE 05/04/2018 2025   PROTEINUR NEGATIVE 05/04/2018 2025   UROBILINOGEN 0.2 11/16/2013 0149   NITRITE NEGATIVE 05/04/2018 2025   LEUKOCYTESUR NEGATIVE 05/04/2018 2025   LEUKOCYTESUR Negative 07/08/2014 0522   Sepsis Labs: @LABRCNTIP (procalcitonin:4,lacticidven:4) )No results found for this or any previous visit (from the past 240 hour(s)).   Radiological Exams on Admission: Ct Angio Head W Or Wo Contrast  Result Date: 05/04/2018 CLINICAL DATA:  Left-sided weakness EXAM: CT ANGIOGRAPHY HEAD AND NECK CT PERFUSION BRAIN TECHNIQUE: Multidetector CT imaging of the head and neck was performed using the standard protocol during bolus administration of intravenous contrast. Multiplanar CT image reconstructions and MIPs were obtained to evaluate the vascular anatomy. Carotid stenosis measurements (when applicable) are obtained utilizing NASCET criteria, using the distal internal carotid diameter as the denominator. Multiphase CT imaging of the brain was performed following IV bolus contrast injection. Subsequent parametric perfusion maps were calculated using RAPID software. CONTRAST:  112mL ISOVUE-370 IOPAMIDOL (ISOVUE-370) INJECTION 76% COMPARISON:  CT head 05/04/2018 FINDINGS: CTA NECK FINDINGS Aortic arch: Minimal atherosclerotic disease aortic arch. Right carotid system: Right carotid widely patent without significant stenosis. Mild plaque in the right carotid bulb Left carotid system: No significant left carotid stenosis. Minimal atherosclerotic disease at the bifurcation Vertebral arteries: Left vertebral artery origin from the aortic arch with moderate stenosis proximally. Right vertebral artery widely patent Skeleton: Negative Other neck: Negative Upper chest: Negative Review of the MIP images confirms the above findings CTA HEAD FINDINGS Anterior circulation: Cavernous carotid widely  patent bilaterally. Anterior and middle cerebral arteries widely patent bilaterally Posterior circulation: Both vertebral arteries patent to the basilar. Basilar widely patent. PICA widely patent. Superior cerebellar arteries widely patent. Fetal origin of the posterior cerebral artery without significant stenosis Venous sinuses: Patent Anatomic variants: None Delayed phase: Not perform Review of the MIP images confirms the above findings CT Brain Perfusion Findings: CBF (<30%) Volume: 62mL Perfusion (Tmax>6.0s) volume: 54mL Mismatch Volume: 44mL Infarction Location:None IMPRESSION: 1. Negative CT brain perfusion 2. No significant intracranial stenosis 3. Carotid  artery widely patent in the neck. Moderate stenosis origin of left vertebral artery which arises from the aortic arch. Electronically Signed   By: Franchot Gallo M.D.   On: 05/04/2018 20:32   Dg Chest 1 View  Result Date: 05/04/2018 CLINICAL DATA:  Acute onset of left-sided arm and leg weakness. Generalized chest pain. Code stroke. EXAM: CHEST  1 VIEW COMPARISON:  CTA of the chest performed 07/12/2017 FINDINGS: The lungs are well-aerated and clear. There is no evidence of focal opacification, pleural effusion or pneumothorax. The cardiomediastinal silhouette is within normal limits. No acute osseous abnormalities are seen. IMPRESSION: No acute cardiopulmonary process seen. Electronically Signed   By: Garald Balding M.D.   On: 05/04/2018 22:30   Ct Angio Neck W Or Wo Contrast  Result Date: 05/04/2018 CLINICAL DATA:  Left-sided weakness EXAM: CT ANGIOGRAPHY HEAD AND NECK CT PERFUSION BRAIN TECHNIQUE: Multidetector CT imaging of the head and neck was performed using the standard protocol during bolus administration of intravenous contrast. Multiplanar CT image reconstructions and MIPs were obtained to evaluate the vascular anatomy. Carotid stenosis measurements (when applicable) are obtained utilizing NASCET criteria, using the distal internal carotid  diameter as the denominator. Multiphase CT imaging of the brain was performed following IV bolus contrast injection. Subsequent parametric perfusion maps were calculated using RAPID software. CONTRAST:  127mL ISOVUE-370 IOPAMIDOL (ISOVUE-370) INJECTION 76% COMPARISON:  CT head 05/04/2018 FINDINGS: CTA NECK FINDINGS Aortic arch: Minimal atherosclerotic disease aortic arch. Right carotid system: Right carotid widely patent without significant stenosis. Mild plaque in the right carotid bulb Left carotid system: No significant left carotid stenosis. Minimal atherosclerotic disease at the bifurcation Vertebral arteries: Left vertebral artery origin from the aortic arch with moderate stenosis proximally. Right vertebral artery widely patent Skeleton: Negative Other neck: Negative Upper chest: Negative Review of the MIP images confirms the above findings CTA HEAD FINDINGS Anterior circulation: Cavernous carotid widely patent bilaterally. Anterior and middle cerebral arteries widely patent bilaterally Posterior circulation: Both vertebral arteries patent to the basilar. Basilar widely patent. PICA widely patent. Superior cerebellar arteries widely patent. Fetal origin of the posterior cerebral artery without significant stenosis Venous sinuses: Patent Anatomic variants: None Delayed phase: Not perform Review of the MIP images confirms the above findings CT Brain Perfusion Findings: CBF (<30%) Volume: 74mL Perfusion (Tmax>6.0s) volume: 55mL Mismatch Volume: 68mL Infarction Location:None IMPRESSION: 1. Negative CT brain perfusion 2. No significant intracranial stenosis 3. Carotid artery widely patent in the neck. Moderate stenosis origin of left vertebral artery which arises from the aortic arch. Electronically Signed   By: Franchot Gallo M.D.   On: 05/04/2018 20:32   Mr Brain Wo Contrast  Result Date: 05/04/2018 CLINICAL DATA:  57 y/o M; new left-sided arm and leg weakness. Altered mental status. EXAM: MRI HEAD WITHOUT  CONTRAST TECHNIQUE: Multiplanar, multiecho pulse sequences of the brain and surrounding structures were obtained without intravenous contrast. COMPARISON:  05/04/2018 CT head, CTA head, CT perfusion head. 09/01/2016 MRI head. FINDINGS: Brain: Motion degradation on multiple sequences. No acute infarction, hemorrhage, hydrocephalus, extra-axial collection or mass lesion. Vascular: Normal flow voids. Skull and upper cervical spine: Normal marrow signal. Sinuses/Orbits: Moderate paranasal sinus mucosal thickening. No abnormal signal of mastoid air cells. Orbits are unremarkable. Other: None. IMPRESSION: 1. Motion degradation of several sequences. 2. No acute intracranial abnormality identified. 3. Moderate paranasal sinus disease. Electronically Signed   By: Kristine Garbe M.D.   On: 05/04/2018 22:04   Ct Cerebral Perfusion W Contrast  Result Date: 05/04/2018 CLINICAL DATA:  Left-sided weakness EXAM: CT ANGIOGRAPHY HEAD AND NECK CT PERFUSION BRAIN TECHNIQUE: Multidetector CT imaging of the head and neck was performed using the standard protocol during bolus administration of intravenous contrast. Multiplanar CT image reconstructions and MIPs were obtained to evaluate the vascular anatomy. Carotid stenosis measurements (when applicable) are obtained utilizing NASCET criteria, using the distal internal carotid diameter as the denominator. Multiphase CT imaging of the brain was performed following IV bolus contrast injection. Subsequent parametric perfusion maps were calculated using RAPID software. CONTRAST:  125mL ISOVUE-370 IOPAMIDOL (ISOVUE-370) INJECTION 76% COMPARISON:  CT head 05/04/2018 FINDINGS: CTA NECK FINDINGS Aortic arch: Minimal atherosclerotic disease aortic arch. Right carotid system: Right carotid widely patent without significant stenosis. Mild plaque in the right carotid bulb Left carotid system: No significant left carotid stenosis. Minimal atherosclerotic disease at the bifurcation  Vertebral arteries: Left vertebral artery origin from the aortic arch with moderate stenosis proximally. Right vertebral artery widely patent Skeleton: Negative Other neck: Negative Upper chest: Negative Review of the MIP images confirms the above findings CTA HEAD FINDINGS Anterior circulation: Cavernous carotid widely patent bilaterally. Anterior and middle cerebral arteries widely patent bilaterally Posterior circulation: Both vertebral arteries patent to the basilar. Basilar widely patent. PICA widely patent. Superior cerebellar arteries widely patent. Fetal origin of the posterior cerebral artery without significant stenosis Venous sinuses: Patent Anatomic variants: None Delayed phase: Not perform Review of the MIP images confirms the above findings CT Brain Perfusion Findings: CBF (<30%) Volume: 74mL Perfusion (Tmax>6.0s) volume: 25mL Mismatch Volume: 58mL Infarction Location:None IMPRESSION: 1. Negative CT brain perfusion 2. No significant intracranial stenosis 3. Carotid artery widely patent in the neck. Moderate stenosis origin of left vertebral artery which arises from the aortic arch. Electronically Signed   By: Franchot Gallo M.D.   On: 05/04/2018 20:32   Ct Head Code Stroke Wo Contrast  Result Date: 05/04/2018 CLINICAL DATA:  Code stroke.  Left-sided weakness and facial droop EXAM: CT HEAD WITHOUT CONTRAST TECHNIQUE: Contiguous axial images were obtained from the base of the skull through the vertex without intravenous contrast. COMPARISON:  CT head 07/12/2017 FINDINGS: Brain: Mild ventricular enlargement is unchanged and most consistent with mild atrophy. Negative for acute infarct, hemorrhage, or mass Vascular: Diffusely increased density of intracranial arteries and veins compatible with polycythemia. Negative for acute vascular thrombosis Skull: Negative Sinuses/Orbits: Mild mucosal edema paranasal sinuses. Other: None ASPECTS ([place] Stroke Program Early CT Score) - Ganglionic level infarction  (caudate, lentiform nuclei, internal capsule, insula, M1-M3 cortex): 7 - Supraganglionic infarction (M4-M6 cortex): 3 Total score (0-10 with 10 being normal): 10 IMPRESSION: 1. No acute intracranial abnormality 2. ASPECTS is 10 3. These results were called by telephone at the time of interpretation on 05/04/2018 at 8:13 pm to Dr. Roland Rack , who verbally acknowledged these results. Electronically Signed   By: Franchot Gallo M.D.   On: 05/04/2018 20:13     EKG: Independently reviewed. Sinus rhythm, QTC 450, nonspecific T wave change   Assessment/Plan Principal Problem:   Left-sided weakness Active Problems:   Chest pain   HTN (hypertension)   Alcohol use disorder, moderate, dependence (HCC)   Cocaine use disorder, moderate, dependence (HCC)   Depression   Tobacco use disorder   Dyslipidemia   Leukocytosis   Left-sided weakness and difficult speaking: Etiology is not clear.  CT head is negative for acute intracranial abnormalities.  CT angiogram of head, neck and CT of cerebral perfusion are all negative.  Dr. Leonel Ramsay of neurology was consulted-->suspect psychogenic etiology than  the organic. He recommended to get an MRI first. Per Dr. Leonel Ramsay, if pt continues to have symptoms tomorrow, could consider EEG  -will place on tele bed for obs -MRI of brain. -pt failed swallowing screen in ED-->will get SLP -hold oral meds now -PT/OT -ASA per rectum  HLD: -hold oral meds: lipitor  Chest pain: Possibly due to cocaine abuse.  His UDS is positive for cocaine. CXR negative.  -cycle CE q6 x3 and repeat EKG in the am  - ASA - Risk factor stratification: will check FLP and A1C  - 2d echo - nitroglycerin patch prn if pt has persistent CP (not ordered yet)  HTN:  -hold oral home medications: -IV hydralazine prn  Polysubstance abuse: Alcohol, tobacco, cocaine -CIWA protocol, -Nicotine patch -ED counseling about importance of keeping substance abuse and mental status  improved  Depression: -hold home oral meds  Leukocytosis: no signs of infection. No fever. Likely due to stress induced to demargination. UA negative. CXR negative. -will follow up blood -f/u by CBC  GERD: -Pepcid IV   DVT ppx: SQ Lovenox Code Status: Full code Family Communication: None at bed side.   Disposition Plan:  Anticipate discharge back to previous home environment Consults called: Dr. Leonel Ramsay of neurology Admission status: Obs / tele     Date of Service 05/05/2018    Ivor Costa [facility] Pager [phone]  If 7PM-7AM, please contact night-coverage [email or web address] Password [email or web address] 05/05/2018, 1:55 AM

## 2018-05-04 NOTE — ED Notes (Signed)
Patient transported to MRI 

## 2018-05-04 NOTE — ED Provider Notes (Signed)
[facility] EMERGENCY DEPARTMENT Provider Note   CSN: [id number] Arrival date & time: 05/04/18  1947   An emergency department physician performed an initial assessment on this suspected stroke patient at 32.  History   Chief Complaint Chief Complaint  Patient presents with  . Code Stroke    HPI Ian [name] is a 57 y.o. male.  5 caveat secondary to altered mental status.  Patient with significant medical problems and also psychiatric disease who is a very difficult historian.  EMS states that the patient had new left-sided arm and leg weakness and chest pain and activated as a code stroke for arrival to this hospital.  It is unclear of the time of onset patient may have woken up with this but he is inconsistent.  When I asked him he says he does not recall when it started.  He is also complaining of chest pain and he is not sure when that started.  He told me he has had a stroke before and has had chest pain before.  He admits to alcohol and he said he used cocaine yesterday.  On review prior records he does have many psychiatric visits and they seem to be accompanied by some medical complaints including chest pain.  The history is provided by the patient and the EMS personnel.  Cerebrovascular Accident  This is a new problem. Episode onset: unknown. The problem occurs constantly. The problem has not changed since onset.Associated symptoms include chest pain. Pertinent negatives include no abdominal pain, no headaches and no shortness of breath. Nothing aggravates the symptoms. Nothing relieves the symptoms. He has tried nothing for the symptoms. The treatment provided no relief.    Past Medical History:  Diagnosis Date  . Anxiety   . Barrett esophagus   . Cancer ([place])   . Coronary artery disease   . Depression   . GERD (gastroesophageal reflux disease)   . Hypertension   . Stroke [facility]) 2014   "mini-stroke" per patient    Patient Active Problem List   Diagnosis Date Noted  . Severe recurrent major depression without psychotic features ([place]) 11/05/2017  . Dyslipidemia 01/07/2017  . Barrett's esophagus 01/07/2017  . Bipolar I disorder, most recent episode depressed with anxious distress ([place]) 01/06/2017  . Cannabis use disorder, moderate, dependence ([place]) 01/06/2017  . Tobacco use disorder 01/06/2017  . Alcohol use disorder, moderate, dependence ([place]) 10/06/2016  . Substance induced mood disorder ([place]) 10/06/2016  . Cocaine use disorder, moderate, dependence ([name]) 10/06/2016  . Depression 10/06/2016  . Acute left-sided weakness 05/03/2015  . CVA (cerebral infarction) 05/03/2015  . Weakness 11/16/2013  . Chest pain 11/16/2013  . HTN (hypertension) 11/16/2013  . Left-sided weakness 11/16/2013  . Torsades de pointes ([place]) 11/16/2013  . Hemiplegia, unspecified, affecting nondominant side 11/15/2013  . Speech and language deficits 11/15/2013    Past Surgical History:  Procedure Laterality Date  . ANGIOPLASTY    . APPENDECTOMY    . CARDIAC CATHETERIZATION    . CHOLECYSTECTOMY    . WRIST SURGERY Right    age 53        Home Medications    Prior to Admission medications   Medication Sig Start Date End Date Taking? Authorizing Provider  aspirin 81 MG EC tablet Take 1 tablet (81 mg total) by mouth daily. 11/11/17   McNew, Tyson Babinski, MD  atorvastatin (LIPITOR) 40 MG tablet Take 1 tablet (40 mg total) by mouth daily at 6 PM. 11/11/17 12/11/17  McNew, [place]  R, MD  busPIRone (BUSPAR) 10 MG tablet Take 1 tablet (10 mg total) by mouth 3 (three) times daily. 11/11/17   McNew, Tyson Babinski, MD  gabapentin (NEURONTIN) 300 MG capsule Take 1 capsule (300 mg total) by mouth 3 (three) times daily. 11/11/17 11/11/18  McNew, Tyson Babinski, MD  hydrOXYzine (ATARAX/VISTARIL) 50 MG tablet Take 1 tablet (50 mg total) by mouth every 6 (six) hours as needed for anxiety. 11/11/17   McNew, Tyson Babinski, MD  lisinopril (PRINIVIL,ZESTRIL) 20 MG tablet Take 0.5 tablets (10 mg  total) by mouth daily. 11/16/17   McNew, Tyson Babinski, MD  mirtazapine (REMERON) 15 MG tablet Take 1 tablet (15 mg total) by mouth at bedtime. 11/11/17   McNew, Tyson Babinski, MD  QUEtiapine (SEROQUEL XR) 300 MG 24 hr tablet Take 1 tablet (300 mg total) by mouth at bedtime. 11/11/17   McNew, Tyson Babinski, MD    Family History Family History  Problem Relation Age of Onset  . Stroke Father   . Leukemia Father   . Breast cancer Sister     Social History Social History   Tobacco Use  . Smoking status: Current Every Day Smoker    Packs/day: 1.00    Types: Cigarettes  . Smokeless tobacco: Never Used  Substance Use Topics  . Alcohol use: Yes    Comment: denies being a daily drinker but did have alcohol today  . Drug use: Yes    Types: Cocaine, Marijuana    Comment: used cocaine today; denies smoking marijuana for years     Allergies   Fluoxetine and Hydrocodone-acetaminophen   Review of Systems Review of Systems  Unable to perform ROS: Mental status change  Constitutional: Negative for chills and fever.  HENT: Negative for sore throat.   Eyes: Negative for visual disturbance.  Respiratory: Negative for shortness of breath.   Cardiovascular: Positive for chest pain.  Gastrointestinal: Negative for abdominal pain and vomiting.  Musculoskeletal: Positive for gait problem.  Skin: Negative for rash.  Neurological: Negative for headaches.     Physical Exam Updated Vital Signs BP (!) 154/88 (BP Location: Left Arm)   Pulse 89   Temp 97.8 F (36.6 C)   Resp 20   SpO2 95%   Physical Exam  Constitutional: He appears well-developed and well-nourished.  HENT:  Head: Normocephalic and atraumatic.  Eyes: Conjunctivae are normal.  Neck: Neck supple.  Cardiovascular: Normal rate, regular rhythm and normal heart sounds.  Pulmonary/Chest: Effort normal. No respiratory distress. He has no wheezes.  Abdominal: He exhibits no mass. There is no guarding.  Musculoskeletal: Normal range of motion.  He exhibits no tenderness or deformity.  Neurological: He is alert. GCS eye subscore is 4. GCS verbal subscore is 5. GCS motor subscore is 6.  Patient has some stuttering and sometimes garbled speech.  He seems to be concentrating to get his words out.  He has limited use of his left arm and left leg.  His pupils are equal round reactive to light.  Skin: Skin is warm and dry.  Psychiatric: He has a normal mood and affect.  Nursing note and vitals reviewed.    ED Treatments / Results  Labs (all labs ordered are listed, but only abnormal results are displayed) Labs Reviewed  CBC - Abnormal; Notable for the following components:      Result Value   WBC 14.7 (*)    Hemoglobin 17.4 (*)    HCT 52.5 (*)    All other components within normal limits  DIFFERENTIAL - Abnormal; Notable for the following components:   Neutro Abs 8.0 (*)    Lymphs Abs 5.1 (*)    All other components within normal limits  I-STAT CHEM 8, ED - Abnormal; Notable for the following components:   Glucose, Bld 104 (*)    Hemoglobin 18.4 (*)    HCT 54.0 (*)    All other components within normal limits  PROTIME-INR  APTT  ETHANOL  COMPREHENSIVE METABOLIC PANEL  RAPID URINE DRUG SCREEN, HOSP PERFORMED  URINALYSIS, ROUTINE W REFLEX MICROSCOPIC  I-STAT TROPONIN, ED    EKG EKG Interpretation  Date/Time:  Tuesday May 04 2018 20:33:20 EDT Ventricular Rate:  86 PR Interval:    QRS Duration: 92 QT Interval:  376 QTC Calculation: 450 R Axis:   69 Text Interpretation:  Sinus rhythm Confirmed by Virgel Manifold [phone]) on 05/05/2018 10:54:55 AM   Radiology Ct Angio Head W Or Wo Contrast  Result Date: 05/04/2018 CLINICAL DATA:  Left-sided weakness EXAM: CT ANGIOGRAPHY HEAD AND NECK CT PERFUSION BRAIN TECHNIQUE: Multidetector CT imaging of the head and neck was performed using the standard protocol during bolus administration of intravenous contrast. Multiplanar CT image reconstructions and MIPs were obtained to  evaluate the vascular anatomy. Carotid stenosis measurements (when applicable) are obtained utilizing NASCET criteria, using the distal internal carotid diameter as the denominator. Multiphase CT imaging of the brain was performed following IV bolus contrast injection. Subsequent parametric perfusion maps were calculated using RAPID software. CONTRAST:  137mL ISOVUE-370 IOPAMIDOL (ISOVUE-370) INJECTION 76% COMPARISON:  CT head 05/04/2018 FINDINGS: CTA NECK FINDINGS Aortic arch: Minimal atherosclerotic disease aortic arch. Right carotid system: Right carotid widely patent without significant stenosis. Mild plaque in the right carotid bulb Left carotid system: No significant left carotid stenosis. Minimal atherosclerotic disease at the bifurcation Vertebral arteries: Left vertebral artery origin from the aortic arch with moderate stenosis proximally. Right vertebral artery widely patent Skeleton: Negative Other neck: Negative Upper chest: Negative Review of the MIP images confirms the above findings CTA HEAD FINDINGS Anterior circulation: Cavernous carotid widely patent bilaterally. Anterior and middle cerebral arteries widely patent bilaterally Posterior circulation: Both vertebral arteries patent to the basilar. Basilar widely patent. PICA widely patent. Superior cerebellar arteries widely patent. Fetal origin of the posterior cerebral artery without significant stenosis Venous sinuses: Patent Anatomic variants: None Delayed phase: Not perform Review of the MIP images confirms the above findings CT Brain Perfusion Findings: CBF (<30%) Volume: 70mL Perfusion (Tmax>6.0s) volume: 55mL Mismatch Volume: 72mL Infarction Location:None IMPRESSION: 1. Negative CT brain perfusion 2. No significant intracranial stenosis 3. Carotid artery widely patent in the neck. Moderate stenosis origin of left vertebral artery which arises from the aortic arch. Electronically Signed   By: Franchot Gallo M.D.   On: 05/04/2018 20:32   Ct Angio  Neck W Or Wo Contrast  Result Date: 05/04/2018 CLINICAL DATA:  Left-sided weakness EXAM: CT ANGIOGRAPHY HEAD AND NECK CT PERFUSION BRAIN TECHNIQUE: Multidetector CT imaging of the head and neck was performed using the standard protocol during bolus administration of intravenous contrast. Multiplanar CT image reconstructions and MIPs were obtained to evaluate the vascular anatomy. Carotid stenosis measurements (when applicable) are obtained utilizing NASCET criteria, using the distal internal carotid diameter as the denominator. Multiphase CT imaging of the brain was performed following IV bolus contrast injection. Subsequent parametric perfusion maps were calculated using RAPID software. CONTRAST:  129mL ISOVUE-370 IOPAMIDOL (ISOVUE-370) INJECTION 76% COMPARISON:  CT head 05/04/2018 FINDINGS: CTA NECK FINDINGS Aortic arch: Minimal atherosclerotic disease aortic  arch. Right carotid system: Right carotid widely patent without significant stenosis. Mild plaque in the right carotid bulb Left carotid system: No significant left carotid stenosis. Minimal atherosclerotic disease at the bifurcation Vertebral arteries: Left vertebral artery origin from the aortic arch with moderate stenosis proximally. Right vertebral artery widely patent Skeleton: Negative Other neck: Negative Upper chest: Negative Review of the MIP images confirms the above findings CTA HEAD FINDINGS Anterior circulation: Cavernous carotid widely patent bilaterally. Anterior and middle cerebral arteries widely patent bilaterally Posterior circulation: Both vertebral arteries patent to the basilar. Basilar widely patent. PICA widely patent. Superior cerebellar arteries widely patent. Fetal origin of the posterior cerebral artery without significant stenosis Venous sinuses: Patent Anatomic variants: None Delayed phase: Not perform Review of the MIP images confirms the above findings CT Brain Perfusion Findings: CBF (<30%) Volume: 28mL Perfusion (Tmax>6.0s)  volume: 60mL Mismatch Volume: 65mL Infarction Location:None IMPRESSION: 1. Negative CT brain perfusion 2. No significant intracranial stenosis 3. Carotid artery widely patent in the neck. Moderate stenosis origin of left vertebral artery which arises from the aortic arch. Electronically Signed   By: Franchot Gallo M.D.   On: 05/04/2018 20:32   Ct Cerebral Perfusion W Contrast  Result Date: 05/04/2018 CLINICAL DATA:  Left-sided weakness EXAM: CT ANGIOGRAPHY HEAD AND NECK CT PERFUSION BRAIN TECHNIQUE: Multidetector CT imaging of the head and neck was performed using the standard protocol during bolus administration of intravenous contrast. Multiplanar CT image reconstructions and MIPs were obtained to evaluate the vascular anatomy. Carotid stenosis measurements (when applicable) are obtained utilizing NASCET criteria, using the distal internal carotid diameter as the denominator. Multiphase CT imaging of the brain was performed following IV bolus contrast injection. Subsequent parametric perfusion maps were calculated using RAPID software. CONTRAST:  136mL ISOVUE-370 IOPAMIDOL (ISOVUE-370) INJECTION 76% COMPARISON:  CT head 05/04/2018 FINDINGS: CTA NECK FINDINGS Aortic arch: Minimal atherosclerotic disease aortic arch. Right carotid system: Right carotid widely patent without significant stenosis. Mild plaque in the right carotid bulb Left carotid system: No significant left carotid stenosis. Minimal atherosclerotic disease at the bifurcation Vertebral arteries: Left vertebral artery origin from the aortic arch with moderate stenosis proximally. Right vertebral artery widely patent Skeleton: Negative Other neck: Negative Upper chest: Negative Review of the MIP images confirms the above findings CTA HEAD FINDINGS Anterior circulation: Cavernous carotid widely patent bilaterally. Anterior and middle cerebral arteries widely patent bilaterally Posterior circulation: Both vertebral arteries patent to the basilar.  Basilar widely patent. PICA widely patent. Superior cerebellar arteries widely patent. Fetal origin of the posterior cerebral artery without significant stenosis Venous sinuses: Patent Anatomic variants: None Delayed phase: Not perform Review of the MIP images confirms the above findings CT Brain Perfusion Findings: CBF (<30%) Volume: 24mL Perfusion (Tmax>6.0s) volume: 8mL Mismatch Volume: 10mL Infarction Location:None IMPRESSION: 1. Negative CT brain perfusion 2. No significant intracranial stenosis 3. Carotid artery widely patent in the neck. Moderate stenosis origin of left vertebral artery which arises from the aortic arch. Electronically Signed   By: Franchot Gallo M.D.   On: 05/04/2018 20:32   Ct Head Code Stroke Wo Contrast  Result Date: 05/04/2018 CLINICAL DATA:  Code stroke.  Left-sided weakness and facial droop EXAM: CT HEAD WITHOUT CONTRAST TECHNIQUE: Contiguous axial images were obtained from the base of the skull through the vertex without intravenous contrast. COMPARISON:  CT head 07/12/2017 FINDINGS: Brain: Mild ventricular enlargement is unchanged and most consistent with mild atrophy. Negative for acute infarct, hemorrhage, or mass Vascular: Diffusely increased density of intracranial arteries and  veins compatible with polycythemia. Negative for acute vascular thrombosis Skull: Negative Sinuses/Orbits: Mild mucosal edema paranasal sinuses. Other: None ASPECTS ([place] Stroke Program Early CT Score) - Ganglionic level infarction (caudate, lentiform nuclei, internal capsule, insula, M1-M3 cortex): 7 - Supraganglionic infarction (M4-M6 cortex): 3 Total score (0-10 with 10 being normal): 10 IMPRESSION: 1. No acute intracranial abnormality 2. ASPECTS is 10 3. These results were called by telephone at the time of interpretation on 05/04/2018 at 8:13 pm to Dr. Roland Rack , who verbally acknowledged these results. Electronically Signed   By: Franchot Gallo M.D.   On: 05/04/2018 20:13     Procedures Procedures (including critical care time)  Medications Ordered in ED Medications  iopamidol (ISOVUE-370) 76 % injection 100 mL (100 mLs Intravenous Contrast Given 05/04/18 2013)     Initial Impression / Assessment and Plan / ED Course  I have reviewed the triage vital signs and the nursing notes.  Pertinent labs & imaging results that were available during my care of the patient were reviewed by me and considered in my medical decision making (see chart for details).  Clinical Course as of May 05 1241  Tue May 04, 2018  2040 Patient was evaluated by Dr. Katherine Roan on arrival.  After he reviewed his CAT scans he is recommended the patient be further work-up with an MRI.  He has multiple risk factors for cardiovascular disease and will likely need to be admitted for chest pain and further work-up of the symptoms.  His initial EKG is unremarkable and his first troponin is negative.  Alcohol is also elevated here at 205.   [MB]  2[address] with Dr. Blaine Hamper hospitalist who will evaluate the patient in the ED for admission.   [MB]    Clinical Course User Index [MB] Hayden Rasmussen, MD     Final Clinical Impressions(s) / ED Diagnoses   Final diagnoses:  Chest pain  Acute left-sided weakness  Alcohol abuse with intoxication [facility])    ED Discharge Orders    None       Hayden Rasmussen, MD 05/05/18 1243

## 2018-05-04 NOTE — Consult Note (Signed)
Neurology Consultation Reason for Consult: Left-sided weakness Referring Physician: Carin Hock  CC: Left-sided weakness  History is obtained from: Patient  HPI: Ian [name] is a 57 y.o. male with a history of "stroke",  Mental illness(? Bipolar, anxietry, depression).  He presents with left-sided weakness, abnormal speech, agitation.  Due to him walking over to his neighbor's house and then becoming plegic on the left side code stroke was called, but on discussing with the patient he states that he has felt weird all day, and was apparently not right per the neighbor when he first started speaking to him. He used cocaine yesterday, has been drinking today.  Of note, he has at least 2 admissions for neurological symptoms in the past that have had negative work-ups.  History is limited from the patient at this time given that he is stuttering and only answering a few questions, though not clearly aphasic.  LKW: Unclear tpa given?: no, unclear time of onset   ROS:  Unable to obtain due to altered mental status.   Past Medical History:  Diagnosis Date  . Anxiety   . Barrett esophagus   . Cancer ([place])   . Coronary artery disease   . Depression   . GERD (gastroesophageal reflux disease)   . Hypertension   . Stroke [facility]) 2014   "mini-stroke" per patient     Family History  Problem Relation Age of Onset  . Stroke Father   . Leukemia Father   . Breast cancer Sister      Social History:  reports that he has been smoking cigarettes.  He has been smoking about 1.00 pack per day. He has never used smokeless tobacco. He reports that he drinks alcohol. He reports that he has current or past drug history. Drugs: Cocaine and Marijuana.   Exam: Current vital signs: BP (!) 154/88 (BP Location: Left Arm)   Pulse 89   Temp 97.8 F (36.6 C)   Resp 20   SpO2 95%  Vital signs in last 24 hours: Temp:  [97.8 F (36.6 C)] 97.8 F (36.6 C) (05/14 2020) Pulse Rate:  [89] 89 (05/14  2020) Resp:  [20] 20 (05/14 2020) BP: (154)/(88) 154/88 (05/14 2020) SpO2:  [95 %] 95 % (05/14 2020)   Physical Exam  Constitutional: Appears slightly disheveled Psych: Appears anxious Eyes: No scleral injection HENT: No OP obstrucion Head: Normocephalic.  Cardiovascular: Normal rate and regular rhythm.  Respiratory: Effort normal, non-labored breathing GI: Soft.  No distension. There is no tenderness.  Skin: WDI  Neuro: Mental Status: Patient is awake, alert, he is able to answer some simple questions, but does not answer the month or his age.  He does follow commands. Cranial Nerves: II: His answers are intermittent, but possibly left lower field cut, though this is not clear.  Pupils are equal, round, and reactive to light.   III,IV, VI: EOMI without ptosis or diploplia.  V: Facial sensation is markedly diminished on the left, he splits midline to vibration VII: He holds his left face tight as he smiles, does not cooperate with putting air into his cheeks to assess for air leak. VIII: hearing is intact to voice X: Uvula elevates symmetrically XI: Shoulder shrug is symmetric. XII: tongue is midline without atrophy or fasciculations.  Motor: He has repetitive nonrhythmic, nonstereotyped flailing-like movements of his right upper extremity which do improve with distraction.  He does move the left side left than the right, but complains of pain with movement of either  limiting exam.  The strength exam is markedly inconsistent throughout.  When his left arm is held over his chest it flops flaccidly, but when held over his face he keeps aloft and avoids it hitting him in the face. Sensory: Endorses decreased sensation throughout the left side Cerebellar: He does not perform.  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  The inconsistent exam in character of his speech abnormality and facial weakness I think are more likely to be psychogenic than organic.  That being said, with his history of smoking, hypertension, CAD I do think that ruling out ischemia with superimposed embellishment as necessary.  Recommendations: 1) MRI brain, stroke work-up only if positive 2) if he continues to have symptoms tomorrow, could consider EEG   Roland Rack, MD [facility] [phone]  If 7pm- 7am, please page neurology on call as listed in [facility].

## 2018-05-04 NOTE — ED Notes (Signed)
Pt to MRI at this time.

## 2018-05-04 NOTE — ED Notes (Signed)
Pt presents with [facility] with left arm/leg flaccidness and aphasia. Unknown LKW.

## 2018-05-04 NOTE — ED Notes (Signed)
Dr Niu at the bedside. 

## 2018-05-05 ENCOUNTER — Observation Stay (HOSPITAL_COMMUNITY): Payer: Medicaid Other

## 2018-05-05 ENCOUNTER — Encounter (HOSPITAL_COMMUNITY): Payer: Self-pay | Admitting: General Practice

## 2018-05-05 ENCOUNTER — Observation Stay (HOSPITAL_BASED_OUTPATIENT_CLINIC_OR_DEPARTMENT_OTHER): Payer: Medicaid Other

## 2018-05-05 ENCOUNTER — Other Ambulatory Visit: Payer: Self-pay

## 2018-05-05 DIAGNOSIS — F1420 Cocaine dependence, uncomplicated: Secondary | ICD-10-CM | POA: Diagnosis not present

## 2018-05-05 DIAGNOSIS — D72829 Elevated white blood cell count, unspecified: Secondary | ICD-10-CM | POA: Diagnosis not present

## 2018-05-05 DIAGNOSIS — I10 Essential (primary) hypertension: Secondary | ICD-10-CM | POA: Diagnosis not present

## 2018-05-05 DIAGNOSIS — R079 Chest pain, unspecified: Secondary | ICD-10-CM

## 2018-05-05 DIAGNOSIS — R531 Weakness: Principal | ICD-10-CM

## 2018-05-05 DIAGNOSIS — I2510 Atherosclerotic heart disease of native coronary artery without angina pectoris: Secondary | ICD-10-CM | POA: Diagnosis not present

## 2018-05-05 DIAGNOSIS — F10129 Alcohol abuse with intoxication, unspecified: Secondary | ICD-10-CM | POA: Diagnosis not present

## 2018-05-05 DIAGNOSIS — F17200 Nicotine dependence, unspecified, uncomplicated: Secondary | ICD-10-CM | POA: Diagnosis not present

## 2018-05-05 DIAGNOSIS — F1020 Alcohol dependence, uncomplicated: Secondary | ICD-10-CM | POA: Diagnosis not present

## 2018-05-05 LAB — LIPID PANEL
CHOL/HDL RATIO: 4.2 ratio
Cholesterol: 125 mg/dL (ref 0–200)
HDL: 30 mg/dL — AB (ref >40)
LDL Cholesterol: 69 mg/dL (ref 0–99)
Triglycerides: 129 mg/dL (ref <150)
VLDL: 26 mg/dL (ref 0–40)

## 2018-05-05 LAB — TROPONIN I: Troponin I: <0.03 ng/mL (ref <0.03)

## 2018-05-05 LAB — ECHOCARDIOGRAM COMPLETE
Height: 72 in
Weight: 4479.75 oz

## 2018-05-05 LAB — HIV ANTIBODY (ROUTINE TESTING W REFLEX): HIV Screen 4th Generation wRfx: NONREACTIVE

## 2018-05-05 LAB — HEMOGLOBIN A1C
Hgb A1c MFr Bld: 5.7 % — ABNORMAL HIGH (ref 4.8–5.6)
Mean Plasma Glucose: 116.89 mg/dL

## 2018-05-05 MED ORDER — PANTOPRAZOLE SODIUM 40 MG PO TBEC: 40.0000 mg | DELAYED_RELEASE_TABLET | Freq: Every day | ORAL | Status: DC

## 2018-05-05 NOTE — Discharge Summary (Signed)
Physician Discharge Summary  YUJI WALTH WGY:[id number] DOB: [birth year] DOA: 05/04/2018  PCP: Zeb Comfort, MD  Admit date: 05/04/2018 Discharge date: 05/05/2018  Admitted From: Home Disposition: Home   Recommendations for Outpatient Follow-up:  1. Follow up with PCP in 1-2 weeks 2. Consider neurology follow up for EMG conduction studies on LUE if symptoms persist.   Home Health: PT, OT Equipment/Devices: None Discharge Condition: Stable CODE STATUS: Full Diet recommendation: Heart healthy  HPI and Interim Summary: Ian [name] is a 57 y.o. male with medical history significant of hypertension, hyperlipidemia, stroke, GERD, depression, anxiety, bipolar disorder, CAD, polysubstance abuse (alcohol, cocaine, tobacco and marijuana), who presents with left-sided weakness, difficulty speaking, AMS and chest pain.  Patient is a very poor historian, history is limited. Pt complains of left sided weakness, not moving his left arm and leg. He has difficulty speaking and agitation.  It is unclear of the time of onset.  Facial droop. He states that he used cocaine yesterday, has been drinking alcohol today. He  also complains of chest pain.  It is located in the substernal area.  patient cannot provide a detailed information to characterize his chest pain.  He states that he has cough and mild shortness of breath.  No tenderness in the calf areas.  He feels nauseated, but no active vomiting or diarrhea noted.  He denies abdominal pain.  No symptoms of UTI.  No fever or chills.  ED Course: pt was found to have WBC 14.7, INR 1.04, PTT 31, negative urinalysis, electrolytes renal function okay, temperature normal, no tachycardia, oxygen saturation 94% on room air. CXR negative. CT head is negative for acute intracranial abnormalities.  CT angiogram of head and neck is negative.  CT angiogram of cerebral perfusion negative.  Patient is placed on telemetry bed for observation.  Dr. Leonel Ramsay of  neurology was consulted. Pt failed swallowing screen in ED.  Hospital Course: Placed in observation. MRI showed no acute process. EEG negative for focal abnormality. Exam has been inconsistent with seeming resolution of weakness at times. Neurology suspects the weakness has a more psychogenic origin than an actual organic cause. No further work up recommended. PT and OT have evaluated the patient and recommend home health services which were ordered.  Discharge Diagnoses:  Principal Problem:   Left-sided weakness Active Problems:   Chest pain   HTN (hypertension)   Alcohol use disorder, moderate, dependence (HCC)   Cocaine use disorder, moderate, dependence (HCC)   Depression   Tobacco use disorder   Dyslipidemia   Leukocytosis  Left-sided weakness, stuttering: With waxing/waning symptoms and inconsistent exam with negative EEG and neuroimaging, most likely nonorgnic cause of symptoms.  - PT/OT home health as ordered - Recommend counseling program as outpatient. Continue antidepressants.  - Social work consulted as inpatient to provide patient with resources.  - PT evaluation note from dayof discharge includes:   "Orders received for PT evaluation. Patient demonstrates deficits in functional mobility as indicated below. Will benefit from continued skilled PT to address deficits and maximize function. Will see as indicated and progress as tolerated.   OF NOTE: multiple inconsistencies throughout assessment. Patient stating that he could not move his LUE at all, however during standing he immediately reached up with his LUE to grab on to the IV pole over his head for stability, then released the pole to switch hands and utilized LUE to performed pericare and hygiene on his posterior buttocks. Patient also unable to move LLE during testing (noted  no muscular activation attempt), however when attempting to come to the EOB, patient was able to abduction LLE and lift LLE to reposition at EOB. When  this therapist commented on improvements noted, patient again lost ability to perform active contraction. When prompted to stand, patient did no with min guard, no physical assist required except for line management. Patient transferred in to chair, stood for hygiene and pericare (self performed) and then transferred back to bed min guard level.   Given current presentation, and some resolution of symptoms feel patient will likely progress quickly. In the chance that patient still shows some fluctuations in ability, may consider HHPT for safety assessment upon discharge.   To be noted, patient commented several times throughout session on concerns regarding his home as well as his desires to acquire housing ([place] housing). Feel patient may potentially benefit from LCSW visit to assist with resources."   Chest pain: Benign ECG and negative troponins. This has resolved. No wall motion abnormalities on echo.   Polysubstance use: +cocaine on UDS, EtOH not checked.  - Cessation counseling provided.   Bipolar disorder: No evidence of psychosis. Psychiatric comorbidity may be underpinning symptoms.  - Continue home medications (wellbutrin, buspar, lexapro, hydroxyzine,remeron,seroquel) and follow up with psychiatry as outpatient.  Leukocytosis: Stress demargination is most likely with negative exam, CXR, UA.  - Monitor at follow up  Discharge Instructions Discharge Instructions    Diet - low sodium heart healthy   Complete by:  As directed    Discharge instructions   Complete by:  As directed    You were admitted with left sided weakness that seems to have improved. No organic cause was found to explain these symptoms. There was no stroke, no nerve impingement, no infection, no seizure, or any other cause. Because the work up was negative you are stable for discharge and your symptoms are expected to get better.  - Continue taking all medications as directed. Follow up with your PCP for refills.   - Home health services are being arranged at discharge. Social work will also evaluate you and help however we can. - If your symptoms return, seek medical attention.   Increase activity slowly   Complete by:  As directed      Allergies as of 05/05/2018      Reactions   Fluoxetine Swelling   Anxiety, itching   Hydrocodone-acetaminophen Itching   Mirtazapine Other (See Comments)   nightmares   Gabapentin Other (See Comments)   Bad dreams and felt bad   Tramadol Other (See Comments)      Medication List    TAKE these medications   aspirin 81 MG EC tablet Take 1 tablet (81 mg total) by mouth daily.   atorvastatin 40 MG tablet Commonly known as:  LIPITOR Take 1 tablet (40 mg total) by mouth daily at 6 PM.   buPROPion 150 MG 24 hr tablet Commonly known as:  WELLBUTRIN XL Take 150 mg by mouth daily.   busPIRone 10 MG tablet Commonly known as:  BUSPAR Take 1 tablet (10 mg total) by mouth 3 (three) times daily.   clonazePAM 0.5 MG tablet Commonly known as:  KLONOPIN Take 0.5 mg by mouth at bedtime.   escitalopram 10 MG tablet Commonly known as:  LEXAPRO Take 10 mg by mouth daily.   gabapentin 300 MG capsule Commonly known as:  NEURONTIN Take 1 capsule (300 mg total) by mouth 3 (three) times daily.   hydrOXYzine 50 MG tablet Commonly known as:  ATARAX/VISTARIL Take 1 tablet (50 mg total) by mouth every 6 (six) hours as needed for anxiety.   lisinopril 20 MG tablet Commonly known as:  PRINIVIL,ZESTRIL Take 0.5 tablets (10 mg total) by mouth daily. What changed:  how much to take   mirtazapine 15 MG tablet Commonly known as:  REMERON Take 1 tablet (15 mg total) by mouth at bedtime.   omeprazole 20 MG capsule Commonly known as:  PRILOSEC Take 20 mg by mouth daily.   QUEtiapine 300 MG 24 hr tablet Commonly known as:  SEROQUEL XR Take 1 tablet (300 mg total) by mouth at bedtime. What changed:  how much to take      Follow-up Information    Zeb Comfort, MD. Schedule an appointment as soon as possible for a visit in 2 week(s).   Specialty:  Family Medicine Contact information: [place] [address] [phone]          Allergies  Allergen Reactions  . Fluoxetine Swelling    Anxiety, itching  . Hydrocodone-Acetaminophen Itching  . Mirtazapine Other (See Comments)    nightmares  . Gabapentin Other (See Comments)    Bad dreams and felt bad  . Tramadol Other (See Comments)    Consultations:  Neurology  Procedures/Studies: Ct Angio Head W Or Wo Contrast  Result Date: 05/04/2018 CLINICAL DATA:  Left-sided weakness EXAM: CT ANGIOGRAPHY HEAD AND NECK CT PERFUSION BRAIN TECHNIQUE: Multidetector CT imaging of the head and neck was performed using the standard protocol during bolus administration of intravenous contrast. Multiplanar CT image reconstructions and MIPs were obtained to evaluate the vascular anatomy. Carotid stenosis measurements (when applicable) are obtained utilizing NASCET criteria, using the distal internal carotid diameter as the denominator. Multiphase CT imaging of the brain was performed following IV bolus contrast injection. Subsequent parametric perfusion maps were calculated using RAPID software. CONTRAST:  137mL ISOVUE-370 IOPAMIDOL (ISOVUE-370) INJECTION 76% COMPARISON:  CT head 05/04/2018 FINDINGS: CTA NECK FINDINGS Aortic arch: Minimal atherosclerotic disease aortic arch. Right carotid system: Right carotid widely patent without significant stenosis. Mild plaque in the right carotid bulb Left carotid system: No significant left carotid stenosis. Minimal atherosclerotic disease at the bifurcation Vertebral arteries: Left vertebral artery origin from the aortic arch with moderate stenosis proximally. Right vertebral artery widely patent Skeleton: Negative Other neck: Negative Upper chest: Negative Review of the MIP images confirms the above findings CTA HEAD FINDINGS Anterior circulation: Cavernous  carotid widely patent bilaterally. Anterior and middle cerebral arteries widely patent bilaterally Posterior circulation: Both vertebral arteries patent to the basilar. Basilar widely patent. PICA widely patent. Superior cerebellar arteries widely patent. Fetal origin of the posterior cerebral artery without significant stenosis Venous sinuses: Patent Anatomic variants: None Delayed phase: Not perform Review of the MIP images confirms the above findings CT Brain Perfusion Findings: CBF (<30%) Volume: 87mL Perfusion (Tmax>6.0s) volume: 6mL Mismatch Volume: 41mL Infarction Location:None IMPRESSION: 1. Negative CT brain perfusion 2. No significant intracranial stenosis 3. Carotid artery widely patent in the neck. Moderate stenosis origin of left vertebral artery which arises from the aortic arch. Electronically Signed   By: Franchot Gallo M.D.   On: 05/04/2018 20:32   Dg Chest 1 View  Result Date: 05/04/2018 CLINICAL DATA:  Acute onset of left-sided arm and leg weakness. Generalized chest pain. Code stroke. EXAM: CHEST  1 VIEW COMPARISON:  CTA of the chest performed 07/12/2017 FINDINGS: The lungs are well-aerated and clear. There is no evidence of focal opacification, pleural effusion or pneumothorax.  The cardiomediastinal silhouette is within normal limits. No acute osseous abnormalities are seen. IMPRESSION: No acute cardiopulmonary process seen. Electronically Signed   By: Garald Balding M.D.   On: 05/04/2018 22:30   Ct Angio Neck W Or Wo Contrast  Result Date: 05/04/2018 CLINICAL DATA:  Left-sided weakness EXAM: CT ANGIOGRAPHY HEAD AND NECK CT PERFUSION BRAIN TECHNIQUE: Multidetector CT imaging of the head and neck was performed using the standard protocol during bolus administration of intravenous contrast. Multiplanar CT image reconstructions and MIPs were obtained to evaluate the vascular anatomy. Carotid stenosis measurements (when applicable) are obtained utilizing NASCET criteria, using the distal  internal carotid diameter as the denominator. Multiphase CT imaging of the brain was performed following IV bolus contrast injection. Subsequent parametric perfusion maps were calculated using RAPID software. CONTRAST:  153mL ISOVUE-370 IOPAMIDOL (ISOVUE-370) INJECTION 76% COMPARISON:  CT head 05/04/2018 FINDINGS: CTA NECK FINDINGS Aortic arch: Minimal atherosclerotic disease aortic arch. Right carotid system: Right carotid widely patent without significant stenosis. Mild plaque in the right carotid bulb Left carotid system: No significant left carotid stenosis. Minimal atherosclerotic disease at the bifurcation Vertebral arteries: Left vertebral artery origin from the aortic arch with moderate stenosis proximally. Right vertebral artery widely patent Skeleton: Negative Other neck: Negative Upper chest: Negative Review of the MIP images confirms the above findings CTA HEAD FINDINGS Anterior circulation: Cavernous carotid widely patent bilaterally. Anterior and middle cerebral arteries widely patent bilaterally Posterior circulation: Both vertebral arteries patent to the basilar. Basilar widely patent. PICA widely patent. Superior cerebellar arteries widely patent. Fetal origin of the posterior cerebral artery without significant stenosis Venous sinuses: Patent Anatomic variants: None Delayed phase: Not perform Review of the MIP images confirms the above findings CT Brain Perfusion Findings: CBF (<30%) Volume: 28mL Perfusion (Tmax>6.0s) volume: 30mL Mismatch Volume: 54mL Infarction Location:None IMPRESSION: 1. Negative CT brain perfusion 2. No significant intracranial stenosis 3. Carotid artery widely patent in the neck. Moderate stenosis origin of left vertebral artery which arises from the aortic arch. Electronically Signed   By: Franchot Gallo M.D.   On: 05/04/2018 20:32   Mr Brain Wo Contrast  Result Date: 05/04/2018 CLINICAL DATA:  57 y/o M; new left-sided arm and leg weakness. Altered mental status. EXAM: MRI  HEAD WITHOUT CONTRAST TECHNIQUE: Multiplanar, multiecho pulse sequences of the brain and surrounding structures were obtained without intravenous contrast. COMPARISON:  05/04/2018 CT head, CTA head, CT perfusion head. 09/01/2016 MRI head. FINDINGS: Brain: Motion degradation on multiple sequences. No acute infarction, hemorrhage, hydrocephalus, extra-axial collection or mass lesion. Vascular: Normal flow voids. Skull and upper cervical spine: Normal marrow signal. Sinuses/Orbits: Moderate paranasal sinus mucosal thickening. No abnormal signal of mastoid air cells. Orbits are unremarkable. Other: None. IMPRESSION: 1. Motion degradation of several sequences. 2. No acute intracranial abnormality identified. 3. Moderate paranasal sinus disease. Electronically Signed   By: Kristine Garbe M.D.   On: 05/04/2018 22:04   Ct Cerebral Perfusion W Contrast  Result Date: 05/04/2018 CLINICAL DATA:  Left-sided weakness EXAM: CT ANGIOGRAPHY HEAD AND NECK CT PERFUSION BRAIN TECHNIQUE: Multidetector CT imaging of the head and neck was performed using the standard protocol during bolus administration of intravenous contrast. Multiplanar CT image reconstructions and MIPs were obtained to evaluate the vascular anatomy. Carotid stenosis measurements (when applicable) are obtained utilizing NASCET criteria, using the distal internal carotid diameter as the denominator. Multiphase CT imaging of the brain was performed following IV bolus contrast injection. Subsequent parametric perfusion maps were calculated using RAPID software. CONTRAST:  182mL ISOVUE-370  IOPAMIDOL (ISOVUE-370) INJECTION 76% COMPARISON:  CT head 05/04/2018 FINDINGS: CTA NECK FINDINGS Aortic arch: Minimal atherosclerotic disease aortic arch. Right carotid system: Right carotid widely patent without significant stenosis. Mild plaque in the right carotid bulb Left carotid system: No significant left carotid stenosis. Minimal atherosclerotic disease at the  bifurcation Vertebral arteries: Left vertebral artery origin from the aortic arch with moderate stenosis proximally. Right vertebral artery widely patent Skeleton: Negative Other neck: Negative Upper chest: Negative Review of the MIP images confirms the above findings CTA HEAD FINDINGS Anterior circulation: Cavernous carotid widely patent bilaterally. Anterior and middle cerebral arteries widely patent bilaterally Posterior circulation: Both vertebral arteries patent to the basilar. Basilar widely patent. PICA widely patent. Superior cerebellar arteries widely patent. Fetal origin of the posterior cerebral artery without significant stenosis Venous sinuses: Patent Anatomic variants: None Delayed phase: Not perform Review of the MIP images confirms the above findings CT Brain Perfusion Findings: CBF (<30%) Volume: 80mL Perfusion (Tmax>6.0s) volume: 56mL Mismatch Volume: 41mL Infarction Location:None IMPRESSION: 1. Negative CT brain perfusion 2. No significant intracranial stenosis 3. Carotid artery widely patent in the neck. Moderate stenosis origin of left vertebral artery which arises from the aortic arch. Electronically Signed   By: Franchot Gallo M.D.   On: 05/04/2018 20:32   Ct Head Code Stroke Wo Contrast  Result Date: 05/04/2018 CLINICAL DATA:  Code stroke.  Left-sided weakness and facial droop EXAM: CT HEAD WITHOUT CONTRAST TECHNIQUE: Contiguous axial images were obtained from the base of the skull through the vertex without intravenous contrast. COMPARISON:  CT head 07/12/2017 FINDINGS: Brain: Mild ventricular enlargement is unchanged and most consistent with mild atrophy. Negative for acute infarct, hemorrhage, or mass Vascular: Diffusely increased density of intracranial arteries and veins compatible with polycythemia. Negative for acute vascular thrombosis Skull: Negative Sinuses/Orbits: Mild mucosal edema paranasal sinuses. Other: None ASPECTS ([place] Stroke Program Early CT Score) - Ganglionic level  infarction (caudate, lentiform nuclei, internal capsule, insula, M1-M3 cortex): 7 - Supraganglionic infarction (M4-M6 cortex): 3 Total score (0-10 with 10 being normal): 10 IMPRESSION: 1. No acute intracranial abnormality 2. ASPECTS is 10 3. These results were called by telephone at the time of interpretation on 05/04/2018 at 8:13 pm to Dr. Roland Rack , who verbally acknowledged these results. Electronically Signed   By: Franchot Gallo M.D.   On: 05/04/2018 20:13    - Left ventricle: The cavity size was normal. Systolic function was   vigorous. The estimated ejection fraction was in the range of 65%   to 70%. Wall motion was normal; there were no regional wall   motion abnormalities. Left ventricular diastolic function   parameters were normal. - Pulmonary arteries: Systolic pressure could not be accurately   estimated.  Subjective: Left arm weakness is improving. Worked with PT.   Discharge Exam: Vitals:   05/05/18 0723 05/05/18 1145  BP: 133/84 (!) 154/99  Pulse: 85 83  Resp: 14 17  Temp: 97.8 F (36.6 C) 98 F (36.7 C)  SpO2: 97% 98%   General: Pt is alert, awake, not in acute distress Cardiovascular: RRR, S1/S2 +, no rubs, no gallops Respiratory: CTA bilaterally, no wheezing, no rhonchi Abdominal: Soft, NT, ND, bowel sounds + Extremities: No edema, no cyanosis Neuro: Alert, oriented. Intermittent stuttering without aphasia. CNs intact. No tremor noted until examiner entered room in [place] which resolves with distraction. Diminished strength in LUE and LLE with give way weakness, 5/5 when distracted. +Hoover's test.   Labs: BNP (last 3 results) No results for  input(s): BNP in the last 8760 hours. Basic Metabolic Panel: Recent Labs  Lab 05/04/18 1949 05/04/18 1955  NA 140 140  K 4.4 4.2  CL 107 104  CO2 21*  --   GLUCOSE 106* 104*  BUN 10 11  CREATININE 0.70 1.00  CALCIUM 9.2  --    Liver Function Tests: Recent Labs  Lab 05/04/18 1949  AST 62*  ALT 96*   ALKPHOS 76  BILITOT 0.4  PROT 7.1  ALBUMIN 3.8   Recent Labs  Lab 05/04/18 2233  LIPASE 29   No results for input(s): AMMONIA in the last 168 hours. CBC: Recent Labs  Lab 05/04/18 1949 05/04/18 1955  WBC 14.7*  --   NEUTROABS 8.0*  --   HGB 17.4* 18.4*  HCT 52.5* 54.0*  MCV 91.0  --   PLT 191  --    Cardiac Enzymes: Recent Labs  Lab 05/04/18 2233 05/05/18 0300 05/05/18 1027  TROPONINI <0.03 <0.03 <0.03   BNP: Invalid input(s): POCBNP CBG: No results for input(s): GLUCAP in the last 168 hours. D-Dimer No results for input(s): DDIMER in the last 72 hours. Hgb A1c Recent Labs    05/05/18 0300  HGBA1C 5.7*   Lipid Profile Recent Labs    05/05/18 0300  CHOL 125  HDL 30*  LDLCALC 69  TRIG 129  CHOLHDL 4.2   Thyroid function studies No results for input(s): TSH, T4TOTAL, T3FREE, THYROIDAB in the last 72 hours.  Invalid input(s): FREET3 Anemia work up No results for input(s): VITAMINB12, FOLATE, FERRITIN, TIBC, IRON, RETICCTPCT in the last 72 hours. Urinalysis    Component Value Date/Time   COLORURINE STRAW (A) 05/04/2018 2025   APPEARANCEUR CLEAR 05/04/2018 2025   APPEARANCEUR Clear 07/08/2014 0522   LABSPEC 1.010 05/04/2018 2025   LABSPEC 1.008 07/08/2014 0522   PHURINE 5.0 05/04/2018 2025   GLUCOSEU NEGATIVE 05/04/2018 2025   GLUCOSEU Negative 07/08/2014 0522   HGBUR NEGATIVE 05/04/2018 2025   BILIRUBINUR NEGATIVE 05/04/2018 2025   BILIRUBINUR Negative 07/08/2014 0522   KETONESUR NEGATIVE 05/04/2018 2025   PROTEINUR NEGATIVE 05/04/2018 2025   UROBILINOGEN 0.2 11/16/2013 0149   NITRITE NEGATIVE 05/04/2018 2025   LEUKOCYTESUR NEGATIVE 05/04/2018 2025   LEUKOCYTESUR Negative 07/08/2014 0522    Microbiology No results found for this or any previous visit (from the past 240 hour(s)).  Time coordinating discharge: Approximately 40 minutes  Patrecia Pour, MD  [facility] 05/05/2018, 3:04 PM Pager [phone]

## 2018-05-05 NOTE — Plan of Care (Signed)
Adequate for discharge.

## 2018-05-05 NOTE — Progress Notes (Signed)
CSW acknowledging consults for homelessness issues and substance abuse, as well as crisis resources for the patient's housing situation. CSW provided resources for patient to receive outpatient substance abuse counseling, as well as local crisis services to assist with patient's housing and financial concerns; patient indicated that he already had the information that was provided. CSW provided information anyway.  CSW signing off.  Laveda Abbe, [place] Clinical Social Worker [phone]

## 2018-05-05 NOTE — Progress Notes (Signed)
  Echocardiogram 2D Echocardiogram has been performed.  Ian [name] G Manson [place] 05/05/2018, 12:15 PM

## 2018-05-05 NOTE — Evaluation (Signed)
Clinical/Bedside Swallow Evaluation Patient Details  Name: Ian [name] MRN: [id number] Date of Birth: 08/19/1961  Today's Date: 05/05/2018 Time: SLP Start Time (ACUTE ONLY): 0909 SLP Stop Time (ACUTE ONLY): 0920 SLP Time Calculation (min) (ACUTE ONLY): 11 min  Past Medical History:  Past Medical History:  Diagnosis Date  . Anxiety   . Barrett esophagus   . Cancer ([place])   . Coronary artery disease   . Depression   . GERD (gastroesophageal reflux disease)   . Hypertension   . Stroke [facility]) 2014   "mini-stroke" per patient   Past Surgical History:  Past Surgical History:  Procedure Laterality Date  . ANGIOPLASTY    . APPENDECTOMY    . CARDIAC CATHETERIZATION    . CHOLECYSTECTOMY    . WRIST SURGERY Right    age 75   HPI:  Ian [name] is a 57 y.o. male with a history of "stroke", Mental illness(? Bipolar, anxietry, depression).  He presented with left-sided weakness, abnormal speech, agitation.  Due to him walking over to his neighbor's house and then becoming plegic on the left side. Pt reproted he used cocaine 5/13, has been drinking 5/14. MD suspects psychogenic left hemiparesis.  MRI negative.    Assessment / Plan / Recommendation Clinical Impression  Pt presents with swallow function WNL. Pt reports a history of Barretts esophagus with occasional difficulty with particulate textures; he compensates independently. No signs of aspiration or acute deficits. Pt to resume a regular diet with thin liquids. No SLP f/u needed for dysphagia, Ian f/u for cognitive linguistic assessment given short session due to arrival of EEG.  SLP Visit Diagnosis: Dysphagia, unspecified (R13.10)    Aspiration Risk       Diet Recommendation Regular;Thin liquid   Liquid Administration via: Cup;Straw Medication Administration: Whole meds with liquid Supervision: Patient able to self feed    Other  Recommendations     Follow up Recommendations None      Frequency and Duration           Prognosis        Swallow Study   General HPI: Ian [name] is a 57 y.o. male with a history of "stroke", Mental illness(? Bipolar, anxietry, depression).  He presented with left-sided weakness, abnormal speech, agitation.  Due to him walking over to his neighbor's house and then becoming plegic on the left side. Pt reproted he used cocaine 5/13, has been drinking 5/14. MD suspects psychogenic left hemiparesis.  MRI negative.  Type of Study: Bedside Swallow Evaluation Diet Prior to this Study: Regular;Thin liquids Respiratory Status: Nasal cannula History of Recent Intubation: No Behavior/Cognition: Alert;Cooperative;Pleasant mood Oral Cavity Assessment: Within Functional Limits Oral Care Completed by SLP: No Oral Cavity - Dentition: Adequate natural dentition Vision: Functional for self-feeding Self-Feeding Abilities: Needs set up Patient Positioning: Upright in bed Baseline Vocal Quality: Normal Volitional Cough: Strong Volitional Swallow: Able to elicit    Oral/Motor/Sensory Function Overall Oral Motor/Sensory Function: Other (comment)(initially poor effort)   Ice Chips     Thin Liquid Thin Liquid: Within functional limits    Nectar Thick Nectar Thick Liquid: Not tested   Honey Thick Honey Thick Liquid: Not tested   Puree Puree: Within functional limits   Solid   GO   Solid: Within functional limits       [facility], MA CCC-SLP [phone]  Ian [name] 05/05/2018,9:32 AM

## 2018-05-05 NOTE — Progress Notes (Signed)
PT Cancellation Note  Patient Details Name: Ian [name] MRN: [id number] DOB: January 21, 1961   Cancelled Treatment:    Reason Eval/Treat Not Completed: Patient at procedure or test/unavailable, EEG still running upon entering room at 9:58 am, will re-ttempt.   Duncan Dull 05/05/2018, 10:00 AM

## 2018-05-05 NOTE — Progress Notes (Addendum)
Subjective: Patient still complains of left arm and left leg weakness.  Stating that his hand is tingling more today than yesterday and continues to have pinprick sensation that is up to his neck on the left arm.  States he has no sensation in his left leg.  States he cannot move his left leg at all.  Exam: Vitals:   05/05/18 0500 05/05/18 0723  BP:  133/84  Pulse:  85  Resp: 20 14  Temp: (!) 97.5 F (36.4 C) 97.8 F (36.6 C)  SpO2:  97%    Physical Exam   HEENT-  Normocephalic, no lesions, without obvious abnormality.  Normal external eye and conjunctiva.   Extremities- Warm, dry and intact Musculoskeletal-no joint tenderness, deformity or swelling Skin-warm and dry, no hyperpigmentation, vitiligo, or suspicious lesions, has a black dirty look to it secondary most likely to his job Neuro:  Mental Status: Alert, oriented, thought content appropriate.  Speech fluent (however at times he will stutter ) without evidence of aphasia.  Able to follow 3 step commands without difficulty. Cranial Nerves: II: Visual fields grossly normal,  III,IV, VI: ptosis not present, extra-ocular motions intact bilaterally pupils equal, round, reactive to light and accommodation V,VII: smile symmetric, facial light touch sensation normal bilaterally VIII: hearing normal bilaterally IX,X: uvula rises symmetrically XI: bilateral shoulder shrug XII: midline tongue extension Motor: Like yesterday patient does not have any flailing-like movements.  Patient's grip is weak with 3/5 strength however I did note give way weakness with the fingers.  He showed 4/5 strength of the bicep flexion again with give way weakness.  4-/5 strength with triceps extension with no give way weakness on the left arm.  Patient when distracted gave 5/5 strength with dorsiflexion plantarflexion.  Patient has significant positive Hoover's-when asked to lift his left leg he actually lifted his right leg but said he could not lift his  left leg Deep Tendon Reflexes: 2+ and symmetric throughout Plantars: Right: downgoing   Left: downgoing   Medications:  Prior to Admission:  Medications Prior to Admission  Medication Sig Dispense Refill Last Dose  . buPROPion (WELLBUTRIN XL) 150 MG 24 hr tablet Take 150 mg by mouth daily.     Marland Kitchen escitalopram (LEXAPRO) 10 MG tablet Take 10 mg by mouth daily.     Marland Kitchen aspirin 81 MG EC tablet Take 1 tablet (81 mg total) by mouth daily. 30 tablet 0   . atorvastatin (LIPITOR) 40 MG tablet Take 1 tablet (40 mg total) by mouth daily at 6 PM. 30 tablet 0   . busPIRone (BUSPAR) 10 MG tablet Take 1 tablet (10 mg total) by mouth 3 (three) times daily. 90 tablet 0   . clonazePAM (KLONOPIN) 0.5 MG tablet Take 0.5 mg by mouth at bedtime as needed.  0   . gabapentin (NEURONTIN) 300 MG capsule Take 1 capsule (300 mg total) by mouth 3 (three) times daily. 90 capsule 0   . hydrOXYzine (ATARAX/VISTARIL) 50 MG tablet Take 1 tablet (50 mg total) by mouth every 6 (six) hours as needed for anxiety. 60 tablet 0   . lisinopril (PRINIVIL,ZESTRIL) 20 MG tablet Take 0.5 tablets (10 mg total) by mouth daily. 30 tablet 0   . mirtazapine (REMERON) 15 MG tablet Take 1 tablet (15 mg total) by mouth at bedtime. 30 tablet 0   . omeprazole (PRILOSEC) 20 MG capsule Take 20 mg by mouth daily.     . QUEtiapine (SEROQUEL XR) 300 MG 24 hr tablet Take 1 tablet (  300 mg total) by mouth at bedtime. 30 tablet 0    Scheduled: . aspirin  300 mg Rectal Daily  . enoxaparin (LOVENOX) injection  40 mg Subcutaneous Q24H  . folic acid  1 mg Oral Daily  . LORazepam  0-4 mg Intravenous Q6H   Followed by  . [START ON 05/06/2018] LORazepam  0-4 mg Intravenous Q12H  . multivitamin with minerals  1 tablet Oral Daily  . nicotine  21 mg Transdermal Daily  . thiamine  100 mg Oral Daily   Or  . thiamine  100 mg Intravenous Daily   Continuous: . sodium chloride 100 mL/hr at 05/05/18 0047  . famotidine (PEPCID) IV 20 mg (05/05/18 1023)     Pertinent Labs/Diagnostics: MRI as below EEG pending Dg Chest 1 View  Result Date: 05/04/2018 CLINICAL DATA:  Acute onset of left-sided arm and leg weakness. Generalized chest pain. Code stroke. EXAM: CHEST  1 VIEW COMPARISON:  CTA of the chest performed 07/12/2017 FINDINGS: The lungs are well-aerated and clear. There is no evidence of focal opacification, pleural effusion or pneumothorax. The cardiomediastinal silhouette is within normal limits. No acute osseous abnormalities are seen. IMPRESSION: No acute cardiopulmonary process seen. Electronically Signed   By: Garald Balding M.D.   On: 05/04/2018 22:30   Mr Brain Wo Contrast  Result Date: 05/04/2018 CLINICAL DATA:  57 y/o M; new left-sided arm and leg weakness. Altered mental status. EXAM: MRI HEAD WITHOUT CONTRAST TECHNIQUE: Multiplanar, multiecho pulse sequences of the brain and surrounding structures were obtained without intravenous contrast. COMPARISON:  05/04/2018 CT head, CTA head, CT perfusion head. 09/01/2016 MRI head. FINDINGS: Brain: Motion degradation on multiple sequences. No acute infarction, hemorrhage, hydrocephalus, extra-axial collection or mass lesion. Vascular: Normal flow voids. Skull and upper cervical spine: Normal marrow signal. Sinuses/Orbits: Moderate paranasal sinus mucosal thickening. No abnormal signal of mastoid air cells. Orbits are unremarkable. Other: None. IMPRESSION: 1. Motion degradation of several sequences. 2. No acute intracranial abnormality identified. 3. Moderate paranasal sinus disease. Electronically Signed   By: Kristine Garbe M.D.   On: 05/04/2018 22:04   Ct Cerebral Perfusion W Contrast  Result Date: 05/04/2018 CLINICAL DATA:  Left-sided weakness EXAM: CT ANGIOGRAPHY HEAD AND NECK CT PERFUSION BRAIN TECHNIQUE: Multidetector CT imaging of the head and neck was performed using the standard protocol during bolus administration of intravenous contrast. Multiplanar CT image reconstructions  and MIPs were obtained to evaluate the vascular anatomy. Carotid stenosis measurements (when applicable) are obtained utilizing NASCET criteria, using the distal internal carotid diameter as the denominator. Multiphase CT imaging of the brain was performed following IV bolus contrast injection. Subsequent parametric perfusion maps were calculated using RAPID software. CONTRAST:  156mL ISOVUE-370 IOPAMIDOL (ISOVUE-370) INJECTION 76% COMPARISON:  CT head 05/04/2018 FINDINGS: CTA NECK FINDINGS Aortic arch: Minimal atherosclerotic disease aortic arch. Right carotid system: Right carotid widely patent without significant stenosis. Mild plaque in the right carotid bulb Left carotid system: No significant left carotid stenosis. Minimal atherosclerotic disease at the bifurcation Vertebral arteries: Left vertebral artery origin from the aortic arch with moderate stenosis proximally. Right vertebral artery widely patent Skeleton: Negative Other neck: Negative Upper chest: Negative Review of the MIP images confirms the above findings CTA HEAD FINDINGS Anterior circulation: Cavernous carotid widely patent bilaterally. Anterior and middle cerebral arteries widely patent bilaterally Posterior circulation: Both vertebral arteries patent to the basilar. Basilar widely patent. PICA widely patent. Superior cerebellar arteries widely patent. Fetal origin of the posterior cerebral artery without significant stenosis Venous sinuses: Patent  Anatomic variants: None Delayed phase: Not perform Review of the MIP images confirms the above findings CT Brain Perfusion Findings: CBF (<30%) Volume: 75mL Perfusion (Tmax>6.0s) volume: 47mL Mismatch Volume: 51mL Infarction Location:None IMPRESSION: 1. Negative CT brain perfusion 2. No significant intracranial stenosis 3. Carotid artery widely patent in the neck. Moderate stenosis origin of left vertebral artery which arises from the aortic arch. Electronically Signed   By: Franchot Gallo M.D.   On:  05/04/2018 20:32   Ct Head Code Stroke Wo Contrast  Result Date: 05/04/2018 CLINICAL DATA:  Code stroke.  Left-sided weakness and facial droop EXAM: CT HEAD WITHOUT CONTRAST TECHNIQUE: Contiguous axial images were obtained from the base of the skull through the vertex without intravenous contrast. COMPARISON:  CT head 07/12/2017 FINDINGS: Brain: Mild ventricular enlargement is unchanged and most consistent with mild atrophy. Negative for acute infarct, hemorrhage, or mass Vascular: Diffusely increased density of intracranial arteries and veins compatible with polycythemia. Negative for acute vascular thrombosis Skull: Negative Sinuses/Orbits: Mild mucosal edema paranasal sinuses. Other: None ASPECTS ([place] Stroke Program Early CT Score) - Ganglionic level infarction (caudate, lentiform nuclei, internal capsule, insula, M1-M3 cortex): 7 - Supraganglionic infarction (M4-M6 cortex): 3 Total score (0-10 with 10 being normal): 10 IMPRESSION: 1. No acute intracranial abnormality 2. ASPECTS is 10 3. These results were called by telephone at the time of interpretation on 05/04/2018 at 8:13 pm to Dr. Roland Rack , who verbally acknowledged these results. Electronically Signed   By: Franchot Gallo M.D.   On: 05/04/2018 20:13   Jenaro Souder PA-C [facility] [phone]  Attending addendum I have seen and examined the patient with Etta Quill, PA-C. I have reviewed the chart including the history and physical as well as the neurological consultation. I have independently reviewed the images. MRI of the brain shows no acute stroke/bleed/mass-it is severely motion degraded. EEG results are back and the EEG is normal.   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   MRI negative for acute process EEG negative for stroke or any focal abnormality. I suspect that his weakness has a more psychogenic origin than an actual  organic cause. I would not recommend any further stroke work-up.  Recommendations: - No further work-up from a neurological standpoint. - Patient will likely need PT and OT as an outpatient - If patient continues to complain of numbness along with tingling in his arm would consider possibly outpatient neurology EMG nerve conduction study in approximately 4 to 6 weeks.  -- Amie Portland, MD [facility] Pager: [phone] If 7pm to 7am, please call on call as listed on AMION.

## 2018-05-05 NOTE — Care Management Note (Signed)
Case Management Note  Patient Details  Name: Ian [name] MRN: [id number] Date of Birth: 1961-08-03  Subjective/Objective:                    Action/Plan: Pt discharging home with orders for [facility] services. CM spoke to patient and [facility] chosen, [facility] unable to accept referral since patient asking that therapies work with him outside of his home. Pt states his home is a Ship broker house. Drew with [facility] called and was able to accept the referral.  CM met with patient and provided him resources to assist with his bills and ramp at home. CM also provided him outpatient and inpatient drug rehab resources.  Pt states he has no transportation home. Since patient needing assistance with mobility, [facility] arranged and patient in agreement. Transport form on the front of the chart and bedside RN updated.   Expected Discharge Date:  05/05/18               Expected Discharge Plan:  [place]  In-House Referral:  Clinical Social Work  Discharge planning Services  CM Consult  Post Acute Care Choice:  Home Health Choice offered to:  Patient  DME Arranged:    DME Agency:     HH Arranged:  PT, OT HH Agency:     Status of Service:  Completed, signed off  If discussed at [place] of Stay Meetings, dates discussed:    Additional Comments:  Pollie Friar, RN 05/05/2018, 4:18 PM

## 2018-05-05 NOTE — Progress Notes (Signed)
EEG complete - results pending 

## 2018-05-05 NOTE — Progress Notes (Signed)
OT Cancellation Note  Patient Details Name: Ian [name] MRN: [id number] DOB: 1961/12/06   Cancelled Treatment:    Reason Eval/Treat Not Completed: Patient at procedure or test/ unavailable(EEG). OT will continue to follow for evaluation.  [place] 05/05/2018, 9:25 AM  Hulda Humphrey OTR/L [phone]

## 2018-05-05 NOTE — Procedures (Signed)
ELECTROENCEPHALOGRAM REPORT  Date of Study: 05/05/2018  Patient's Name: Ian [name] MRN: [id number] Date of Birth: December 24, 1960  Referring Provider: Amie Portland, MD  Clinical History: 57 y.o. male with presents with history of mental illness and possible stroke presents with left-sided weakness, abnormal speech, agitation.  Medications: Hydralazine Lorazepam  Technical Summary: A multichannel digital EEG recording measured by the international 10-20 system with electrodes applied with paste and impedances below 5000 ohms performed in our laboratory with EKG monitoring in an awake patient.  Hyperventilation was not performed.  Photic stimulation was performed.  The digital EEG was referentially recorded, reformatted, and digitally filtered in a variety of bipolar and referential montages for optimal display.    Description: The patient is awake during the recording.  During maximal wakefulness, there is a symmetric, medium voltage 10 Hz posterior dominant rhythm that attenuates with eye opening.  The record is symmetric.  Stage 2 sleep is not seen.  Photic stimulation did not elicit any abnormalities.  There were no epileptiform discharges or electrographic seizures seen.    EKG lead was unremarkable.  Impression: This awake EEG is normal.    Clinical Correlation: A normal EEG does not exclude a clinical diagnosis of epilepsy.  If further clinical questions remain, prolonged EEG may be helpful.  Clinical correlation is advised.   Metta Clines, DO

## 2018-05-05 NOTE — Evaluation (Signed)
Occupational Therapy Evaluation Patient Details Name: Ian [name] MRN: [id number] DOB: [birth year] Today's Date: 05/05/2018    History of Present Illness Ian [name] is a 57 y.o. male with a history of "stroke", Mental illness(?Bipolar, anxietry, depression).  He presents with left-sided weakness, abnormal speech, agitation.  Of note, he has at least 2 admissions for neurological symptoms in the past that have had negative work-ups. MRI negative for abnormality.   Clinical Impression   PTA Pt mod I in home environment with shower chair.  Patient demonstrates deficits in ADL and functional transfers as indicated below. Will benefit from continued skilled OT  At the [facility] level to address deficits and maximize function in home environment.    No stuttering at all during session. Pt with multiple inconsistencies throughout assessment. During MMT Pt with 4/5 grip strength but no motion otherwise, Patient stating that he could not move his LUE at all, however during standing he immediately reached up with his LUE to grab on to the IV pole over his head for stability, then released the pole to switch hands and utilized LUE to performed pericare and hygiene on his posterior buttocks. When prompted to stand, patient did no with min guard, no physical assist required except for line management. Patient transferred in to chair, stood for hygiene and pericare (self performed) and then transferred back to bed min guard level. With distraction, Pt is WFL with no difficulty.   To be noted, patient commented several times throughout session on concerns regarding his home as well as his desires to acquire housing ([place] housing). Pt complaining about the piles in his house and that he has a ramp that is falling apart, his toilets do not work etc. Feel patient may potentially benefit from LCSW visit to assist with resources.     Follow Up Recommendations  Home health OT    Equipment Recommendations  None  recommended by OT(Pt has appropriate DME)    Recommendations for Other Services       Precautions / Restrictions Precautions Precautions: Fall      Mobility Bed Mobility Overal bed mobility: Needs Assistance Bed Mobility: Rolling;Supine to Sit;Sit to Supine Rolling: Min assist   Supine to sit: Min assist Sit to supine: Min assist   General bed mobility comments: VCs for technique and seqeuncing, min assist with cues for positiong and LE movement (inconsistent functional status, at times requiring no physical assist.   Transfers Overall transfer level: Needs assistance   Transfers: Sit to/from Stand;Stand Pivot Transfers Sit to Stand: Min guard;+2 safety/equipment Stand pivot transfers: Min guard;+2 safety/equipment       General transfer comment: min guard fror safety - no LOB noted    Balance Overall balance assessment: Needs assistance Sitting-balance support: Feet supported Sitting balance-Leahy Scale: Good Sitting balance - Comments: able to sit at EOB no difficulty   Standing balance support: During functional activity Standing balance-Leahy Scale: Fair Standing balance comment: patient was able to stand, release UE support to swtich hands and continue to perform self care and hygiene in standing position without LOB                            ADL either performed or assessed with clinical judgement   ADL Overall ADL's : Needs assistance/impaired Eating/Feeding: Set up;Sitting Eating/Feeding Details (indicate cue type and reason): Pt reports that he cannot use LUE - but able to perform elbow flexion during MMT and with distraction  able to perform [facility] Grooming: Set up;Sitting   Upper Body Bathing: Set up;Sitting   Lower Body Bathing: Min guard   Upper Body Dressing : Minimal assistance   Lower Body Dressing: Moderate assistance;Bed level Lower Body Dressing Details (indicate cue type and reason): Pt unable to don socks without assist Toilet  Transfer: Min guard;Stand-pivot;BSC   Toileting- Clothing Manipulation and Hygiene: Min guard;Sit to/from stand Toileting - Clothing Manipulation Details (indicate cue type and reason): able to use both LUE and RUE to assist with peri care with warm wash cloth       General ADL Comments: Pt inconsistent throughout session. With distraction, pt is able to function at what is likely baseline.      Vision Baseline Vision/History: Wears glasses Wears Glasses: At all times Patient Visual Report: No change from baseline       Perception     Praxis      Pertinent Vitals/Pain       Hand Dominance Right   Extremity/Trunk Assessment Upper Extremity Assessment Upper Extremity Assessment: LUE deficits/detail RUE Deficits / Details: WFL LUE Deficits / Details: inconsistent testing - during MMT able to open and close, elbow flexion in gravity eliminated position however with functional tasks able to use entire arm WFL - cleaning rear peri area/bed mobility - donning BO cuff etc LUE Sensation: decreased light touch(reported) LUE Coordination: decreased fine motor;decreased gross motor(please see comments above - WFL during functional activities)   Lower Extremity Assessment Lower Extremity Assessment: Defer to PT evaluation       Communication Communication Communication: No difficulties   Cognition Arousal/Alertness: Awake/alert Behavior During Therapy: WFL for tasks assessed/performed;Impulsive                                   General Comments: patient tangential during session. Also with inconsistent responses to commands.    General Comments       Exercises     Shoulder Instructions      Home Living Family/patient expects to be discharged to:: Private residence Living Arrangements: Alone Available Help at Discharge: Friend(s);Available PRN/intermittently Type of Home: Mobile home Home Access: Ramped entrance(falling apart)     Home Layout: One  level     Bathroom Shower/Tub: Occupational psychologist: Standard Bathroom Accessibility: No   Home Equipment: Environmental consultant - 2 wheels;Cane - single point;Grab bars - toilet;Grab bars - tub/shower   Additional Comments: sleeps on the couch      Prior Functioning/Environment Level of Independence: Independent        Comments: waiting on opening in housing currently         OT Problem List: Decreased range of motion;Decreased activity tolerance;Impaired balance (sitting and/or standing);Decreased coordination;Decreased safety awareness;Impaired sensation;Obesity;Impaired UE functional use;Other (comment)(mental health/drug and etoh abuse)      OT Treatment/Interventions:      OT Goals(Current goals can be found in the care plan section) Acute Rehab OT Goals Patient Stated Goal: "to get placed in [place] housing" OT Goal Formulation: With patient Time For Goal Achievement: 05/05/18 Potential to Achieve Goals: Good  OT Frequency:     Barriers to D/C:            Co-evaluation              AM-PAC PT "6 Clicks" Daily Activity     Outcome Measure Help from another person eating meals?: A Little Help from another person taking  care of personal grooming?: A Little Help from another person toileting, which includes using toliet, bedpan, or urinal?: A Little Help from another person bathing (including washing, rinsing, drying)?: A Little Help from another person to put on and taking off regular upper body clothing?: A Little Help from another person to put on and taking off regular lower body clothing?: A Lot 6 Click Score: 17   End of Session Nurse Communication: Mobility status  Activity Tolerance: Patient tolerated treatment well Patient left: in bed;with call bell/phone within reach;Other (comment)(hosp)  OT Visit Diagnosis: Unsteadiness on feet (R26.81);Other abnormalities of gait and mobility (R26.89);Hemiplegia and hemiparesis Hemiplegia - Right/Left:  Left Hemiplegia - dominant/non-dominant: Non-Dominant Hemiplegia - caused by: Unspecified(psychotic?)                Time: 4037-0964 OT Time Calculation (min): 20 min Charges:  OT General Charges $OT Visit: 1 Visit OT Evaluation $OT Eval Moderate Complexity: 1 Mod G-Codes:     Hulda Humphrey OTR/L [place] 05/05/2018, 3:22 PM

## 2018-05-05 NOTE — Evaluation (Signed)
Physical Therapy Evaluation Patient Details Name: Ian [name] MRN: [id number] DOB: [birth year] Today's Date: 05/05/2018   History of Present Illness  Ian [name] is a 57 y.o. male with a history of "stroke", Mental illness(?Bipolar, anxietry, depression).  He presents with left-sided weakness, abnormal speech, agitation.  Of note, he has at least 2 admissions for neurological symptoms in the past that have had negative work-ups. MRI negative for abnormality.  Clinical Impression  Orders received for PT evaluation. Patient demonstrates deficits in functional mobility as indicated below. Will benefit from continued skilled PT to address deficits and maximize function. Will see as indicated and progress as tolerated.   OF NOTE: multiple inconsistencies throughout assessment. Patient stating that he could not move his LUE at all, however during standing he immediately reached up with his LUE to grab on to the IV pole over his head for stability, then released the pole to switch hands and utilized LUE to performed pericare and hygiene on his posterior buttocks. Patient also unable to move LLE during testing (noted no muscular activation attempt), however when attempting to come to the EOB, patient was able to abduction LLE and lift LLE to reposition at EOB. When this therapist commented on improvements noted, patient again lost ability to perform active contraction. When prompted to stand, patient did no with min guard, no physical assist required except for line management. Patient transferred in to chair, stood for hygiene and pericare (self performed) and then transferred back to bed min guard level.   Given current presentation, and some resolution of symptoms feel patient will likely progress quickly. In the chance that patient still shows some fluctuations in ability, may consider HHPT for safety assessment upon discharge.   To be noted, patient commented several times throughout session on  concerns regarding his home as well as his desires to acquire housing (gra[place] housing). Feel patient may potentially benefit from LCSW visit to assist with resources.     Follow Up Recommendations Supervision for mobility/OOB;Home health PT(vs no follow up pending improvements)    Equipment Recommendations  None recommended by PT    Recommendations for Other Services       Precautions / Restrictions Precautions Precautions: Fall      Mobility  Bed Mobility Overal bed mobility: Needs Assistance Bed Mobility: Rolling;Supine to Sit;Sit to Supine Rolling: Min assist   Supine to sit: Min assist Sit to supine: Min assist   General bed mobility comments: VCs for technique and seqeuncing, min assist with cues for positiong and LE movement (inconsistent functional status, at times requiring no physical assist.   Transfers Overall transfer level: Needs assistance   Transfers: Sit to/from Stand;Stand Pivot Transfers Sit to Stand: Min guard;+2 safety/equipment Stand pivot transfers: Min guard;+2 safety/equipment       General transfer comment: Min guard for line management and safety, no physical assist required. Patient was able to pivot on both the right and the left lower extremity without evidence of knee buckling. Patient initially in flexed posture during first pivot, then was able to power up to full standing position, reaching the LUE up to hold on to the IV pole and then utilized the LUE to reach around and perform hygiene and pericare.  Ambulation/Gait             General Gait Details: did not perform as Echo tech arrived for testing  Stairs            Wheelchair Mobility    Modified Rankin (Stroke  Patients Only)       Balance Overall balance assessment: Needs assistance Sitting-balance support: Feet supported Sitting balance-Leahy Scale: Good Sitting balance - Comments: able to sit at EOB no difficulty   Standing balance support: During functional  activity Standing balance-Leahy Scale: Fair Standing balance comment: patient was able to stand, release UE support to swtich hands and continue to perform self care and hygiene in standing position without LOB                              Pertinent Vitals/Pain Pain Assessment: No/denies pain    Home Living Family/patient expects to be discharged to:: Private residence Living Arrangements: Alone Available Help at Discharge: Friend(s);Available PRN/intermittently Type of Home: Mobile home Home Access: Ramped entrance(falling apart)     Home Layout: One level Home Equipment: Walker - 2 wheels;Cane - single point;Grab bars - toilet;Grab bars - tub/shower Additional Comments: sleeps on the couch    Prior Function Level of Independence: Independent         Comments: waiting on opening in housing currently      Hand Dominance   Dominant Hand: Right    Extremity/Trunk Assessment        Lower Extremity Assessment Lower Extremity Assessment: RLE deficits/detail;LLE deficits/detail RLE Deficits / Details: initially appeared weakn upon testing, therapist inquired about right being the strong side and then patient showed improved strength LLE Deficits / Details: patient reports left sided weakness. upon testing patient with poor effort upon command and several inconsistencies with strength and ability. Could not wiggle toes or move LE when asked, but was able to move LLE to EOB (until therapist said aloud 'look its moving for you' then inconsistent movement. Patient also able to power up and pivot on LLE (as opposed to stronger RLE) without physical assist of difficulty. X2 during session.  LLE Sensation: (subjectively reports no sensation but could feel wire on foo) LLE Coordination: (inconsistent effort upon testing)       Communication   Communication: No difficulties  Cognition Arousal/Alertness: Awake/alert Behavior During Therapy: WFL for tasks  assessed/performed                                   General Comments: patient tangential during session. Also with inconsistent responses to commands.       General Comments      Exercises     Assessment/Plan    PT Assessment Patient needs continued PT services  PT Problem List Decreased strength;Decreased activity tolerance;Decreased balance;Decreased mobility       PT Treatment Interventions DME instruction;Gait training;Functional mobility training;Therapeutic activities;Balance training;Therapeutic exercise;Patient/family education    PT Goals (Current goals can be found in the Care Plan section)  Acute Rehab PT Goals Patient Stated Goal: "to get placed in [place] housing" PT Goal Formulation: With patient Time For Goal Achievement: 05/19/18 Potential to Achieve Goals: Good    Frequency Min 3X/week   Barriers to discharge Decreased caregiver support      Co-evaluation               AM-PAC PT "6 Clicks" Daily Activity  Outcome Measure Difficulty turning over in bed (including adjusting bedclothes, sheets and blankets)?: Unable Difficulty moving from lying on back to sitting on the side of the bed? : Unable Difficulty sitting down on and standing up from a chair with arms (e.g., wheelchair,  bedside commode, etc,.)?: Unable Help needed moving to and from a bed to chair (including a wheelchair)?: A Little Help needed walking in hospital room?: A Little Help needed climbing 3-5 steps with a railing? : A Lot 6 Click Score: 11    End of Session   Activity Tolerance: Patient tolerated treatment well Patient left: in bed;with call bell/phone within reach(with Echo tech) Nurse Communication: Mobility status PT Visit Diagnosis: Other symptoms and signs involving the nervous system (R29.898)    Time: 6803-2122 PT Time Calculation (min) (ACUTE ONLY): 20 min   Charges:   PT Evaluation $PT Eval Moderate Complexity: 1 Mod     PT G Codes:         Alben Deeds, PT DPT  Board Certified Neurologic Specialist [place] 05/05/2018, 11:31 AM

## 2018-05-05 NOTE — Care Management Note (Signed)
Case Management Note  Patient Details  Name: Ian [name] MRN: [id number] Date of Birth: Feb 08, 1961  Subjective/Objective:    Pt in with lt sided weakness. He is from home alone.                 Action/Plan: HH recommended per PT. Awaiting OT eval. CM following for d/c needs, physician orders.    Expected Discharge Date:  05/12/18               Expected Discharge Plan:  [place]  In-House Referral:     Discharge planning Services  CM Consult  Post Acute Care Choice:    Choice offered to:     DME Arranged:    DME Agency:     HH Arranged:    HH Agency:     Status of Service:  In process, will continue to follow  If discussed at Long Length of Stay Meetings, dates discussed:    Additional Comments:  Pollie Friar, RN 05/05/2018, 11:49 AM

## 2018-05-06 LAB — BLOOD CULTURE ID PANEL (REFLEXED)
Acinetobacter baumannii: NOT DETECTED
CANDIDA PARAPSILOSIS: NOT DETECTED
CANDIDA TROPICALIS: NOT DETECTED
Candida albicans: NOT DETECTED
Candida glabrata: NOT DETECTED
Candida krusei: NOT DETECTED
Enterobacter cloacae complex: NOT DETECTED
Enterobacteriaceae species: NOT DETECTED
Enterococcus species: NOT DETECTED
Escherichia coli: NOT DETECTED
HAEMOPHILUS INFLUENZAE: NOT DETECTED
KLEBSIELLA OXYTOCA: NOT DETECTED
Klebsiella pneumoniae: NOT DETECTED
Listeria monocytogenes: NOT DETECTED
METHICILLIN RESISTANCE: NOT DETECTED
NEISSERIA MENINGITIDIS: NOT DETECTED
PSEUDOMONAS AERUGINOSA: NOT DETECTED
Proteus species: NOT DETECTED
SERRATIA MARCESCENS: NOT DETECTED
STAPHYLOCOCCUS AUREUS BCID: NOT DETECTED
STREPTOCOCCUS SPECIES: NOT DETECTED
Staphylococcus species: DETECTED — AB
Streptococcus agalactiae: NOT DETECTED
Streptococcus pneumoniae: NOT DETECTED
Streptococcus pyogenes: NOT DETECTED

## 2018-05-07 LAB — CULTURE, BLOOD (ROUTINE X 2): SPECIAL REQUESTS: ADEQUATE

## 2018-05-09 IMAGING — CT CT HEAD W/O CM
3 series · 16 of 47 positions shown, 19 images · non-contrast
Comparison: Most recent head CT 01/04/2017.  Brain MRI 09/01/2016

CLINICAL DATA: Fall. Slurred speech. Intoxicated. Chronic
left-sided weakness from prior CVA.

EXAM:
CT HEAD WITHOUT CONTRAST
TECHNIQUE: Contiguous axial images were obtained from the base of the skull
through the vertex without intravenous contrast.

[Series 3: head wo · axial · 0.43mm/px · z∈[-210,-80]mm · 10 of 32 slices shown, 13 images]
[im 3/32  brain]
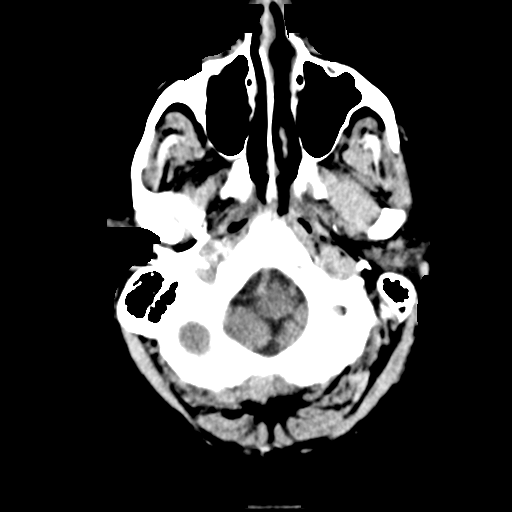
[im 3/32  bone]
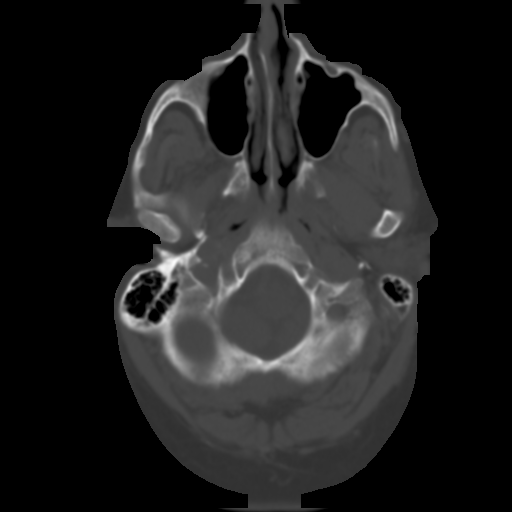
[im 6/32  brain]
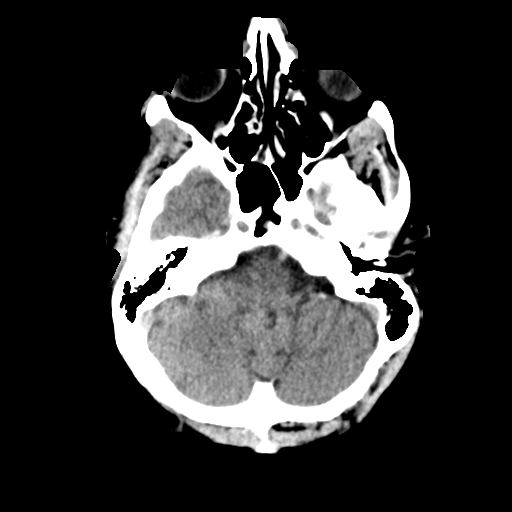
[im 9/32  brain]
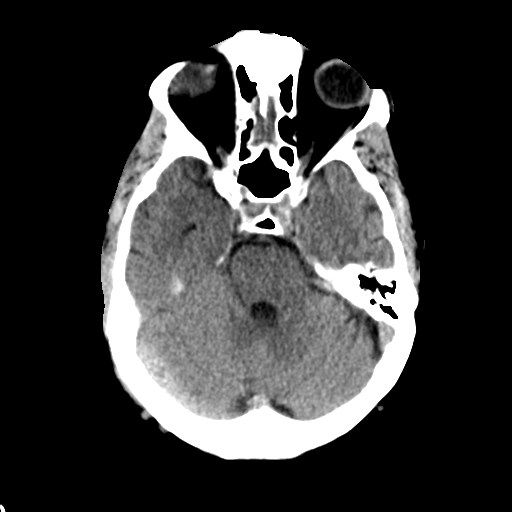
[im 11/32  brain]
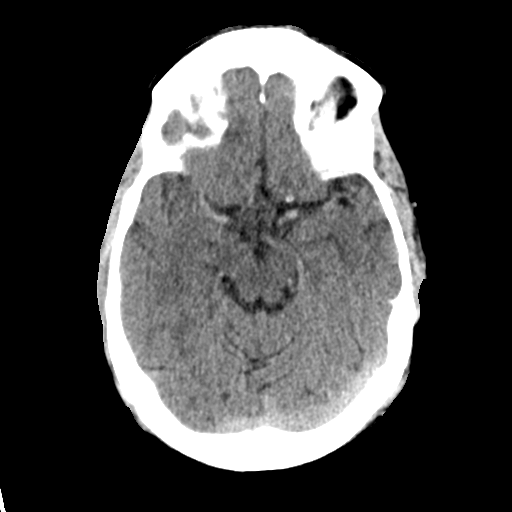
[im 14/32  brain]
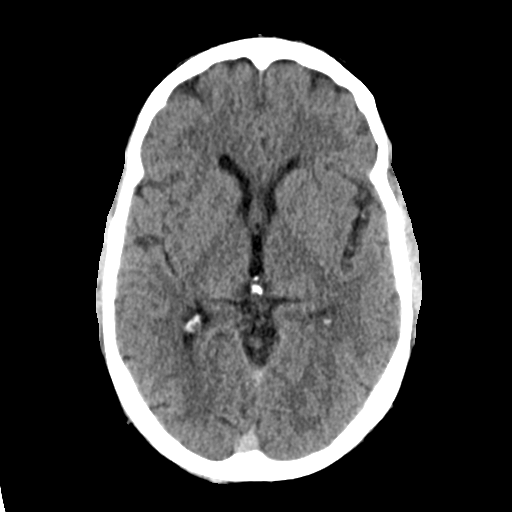
[im 14/32  bone]
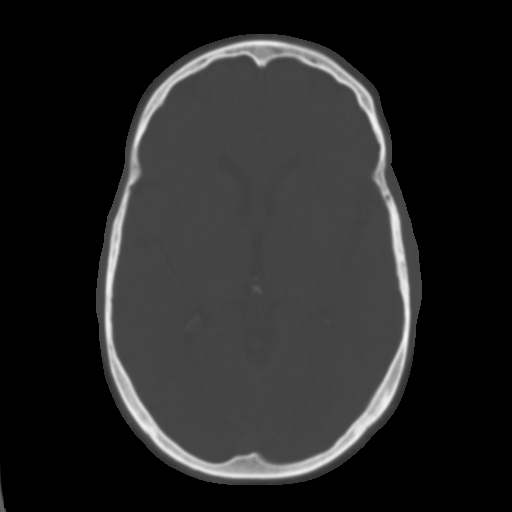
[im 18/32  brain]
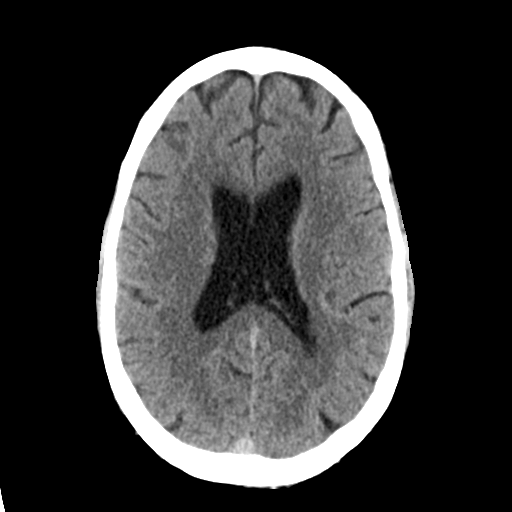
[im 21/32  brain]
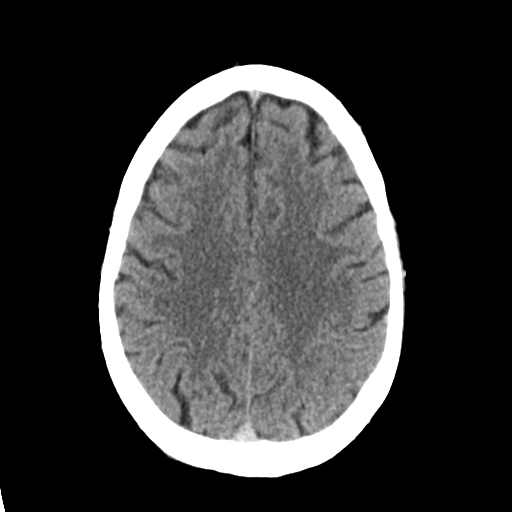
[im 24/32  brain]
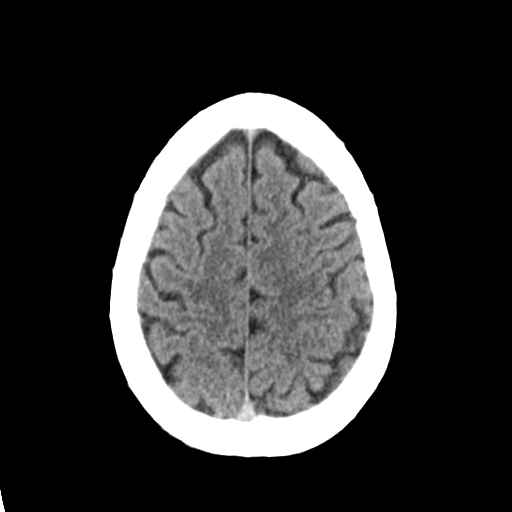
[im 26/32  brain]
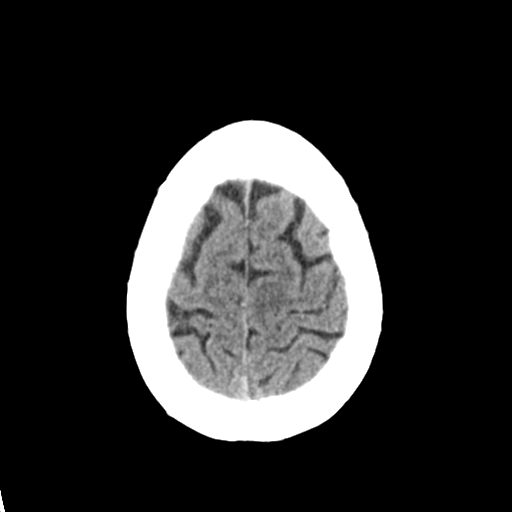
[im 26/32  bone]
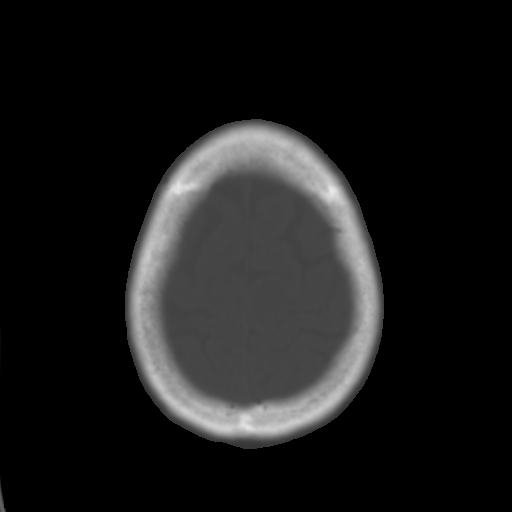
[im 29/32  brain]
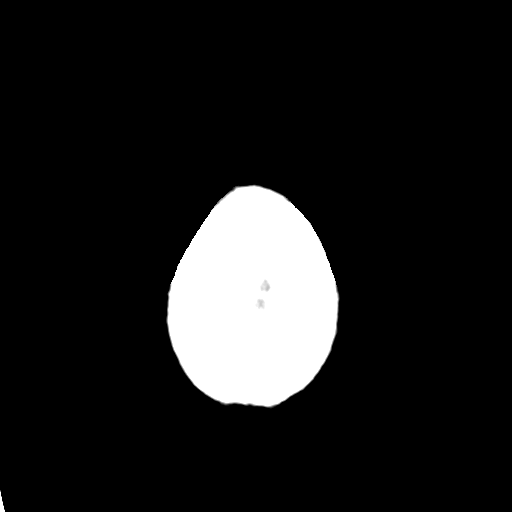

[Series 4: coronal soft tissue · coronal · 0.30mm/px · 3 of 67 slices shown]
[im 23/67  brain]
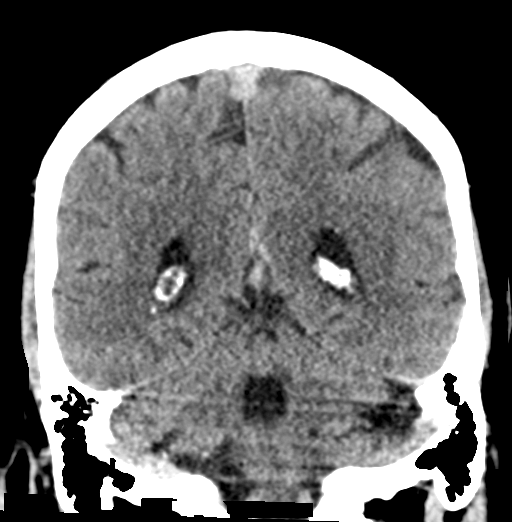
[im 30/67  brain]
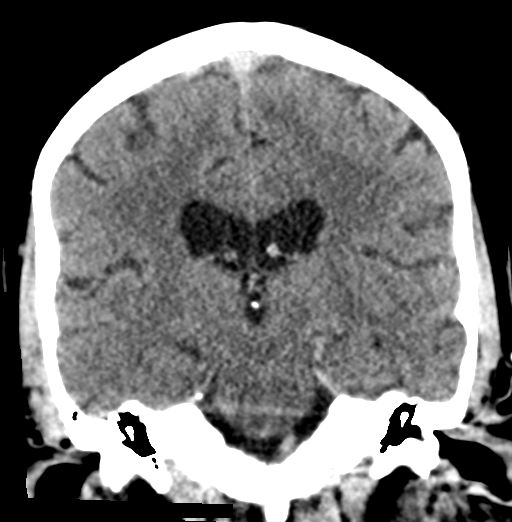
[im 37/67  brain]
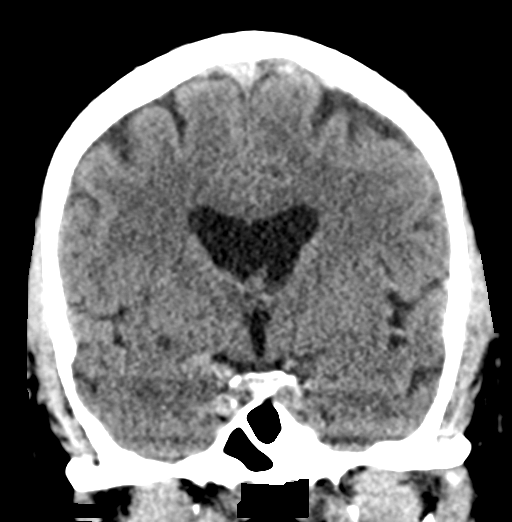

[Series 5: sagittal soft tissue · sagittal · 0.30mm/px · 3 of 51 slices shown]
[im 17/51  brain]
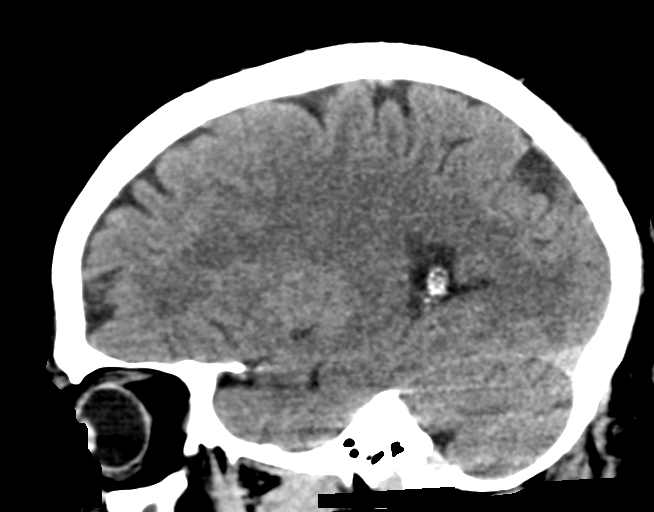
[im 26/51  brain]
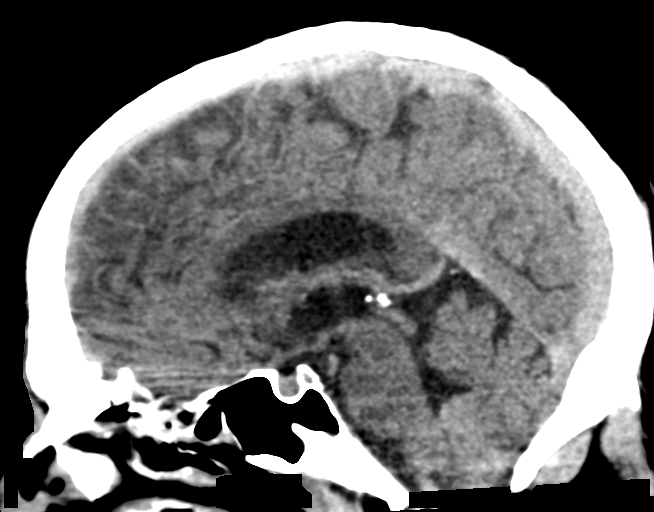
[im 34/51  brain]
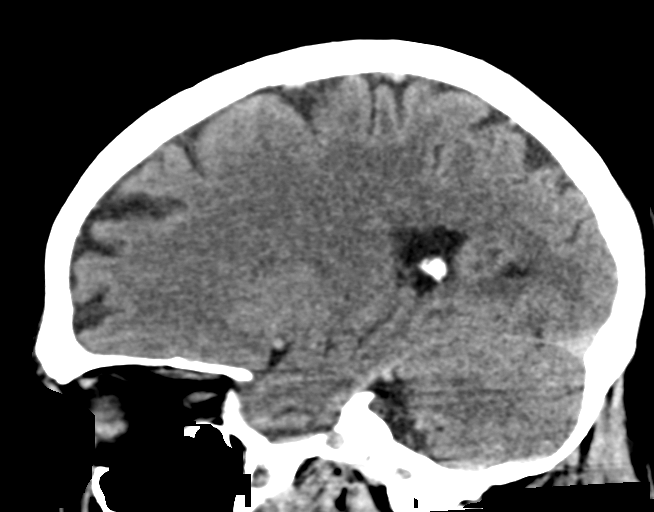

[16 of 47 positions shown; findings below may reference images not displayed]

FINDINGS: Brain: Mild atrophy and chronic small vessel ischemia, stable from
prior exam. No intracranial hemorrhage, mass effect, or midline
shift. No hydrocephalus. The basilar cisterns are patent. No
evidence of territorial infarct. No extra-axial or intracranial
fluid collection.

Vascular: Atherosclerosis of skullbase vasculature without
hyperdense vessel or abnormal calcification.

Skull: No skull fracture.  No focal lesion.

Sinuses/Orbits: Chronic mucosal thickening of the ethmoid air cells
and right maxillary sinus. Frontal sinuses are hypo pneumatized.
Visualized orbits are unremarkable. Mastoid air cells are clear.

Other: None.
IMPRESSION: No acute intracranial abnormality.  No skull fracture.

## 2018-05-09 IMAGING — CT CT CTA ABD/PEL W/CM AND/OR W/O CM
3 of 11 series · 11 of 46 positions shown, 17 images · IV contrast (APPLIED)
Comparison: 01/04/2017

CLINICAL DATA: Chest pain.  Fell tonight.

EXAM:
CT ANGIOGRAPHY CHEST, ABDOMEN AND PELVIS
TECHNIQUE: Multidetector CT imaging through the chest, abdomen and pelvis was
performed using the standard protocol during bolus administration of
intravenous contrast. Multiplanar reconstructed images and MIPs were
obtained and reviewed to evaluate the vascular anatomy.
CONTRAST:  100 mL Isovue 370 intravenous

[Series 6: axial venous · axial · portal-venous · 0.85mm/px · z∈[-586,-211]mm · 5 of 113 slices shown, 10 images]
[im 19/113  soft-tissue]
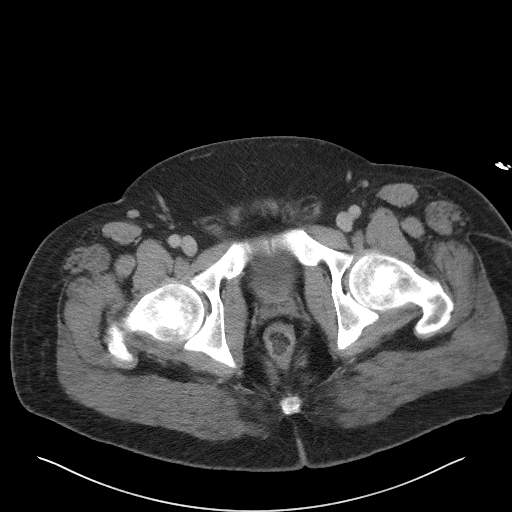
[im 19/113  bone]
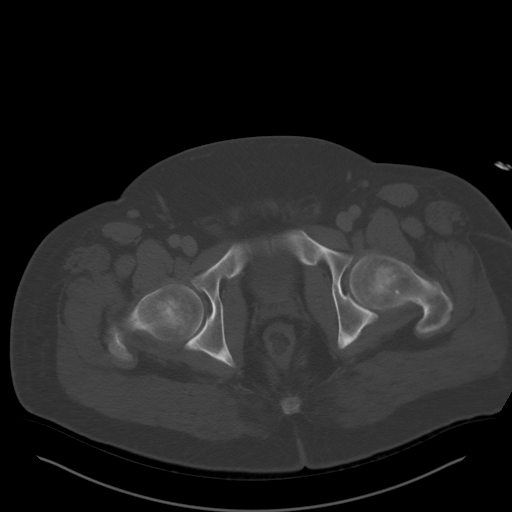
[im 38/113  soft-tissue]
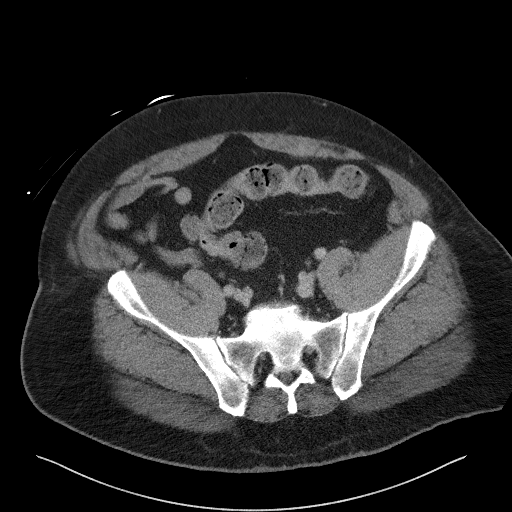
[im 38/113  lung]
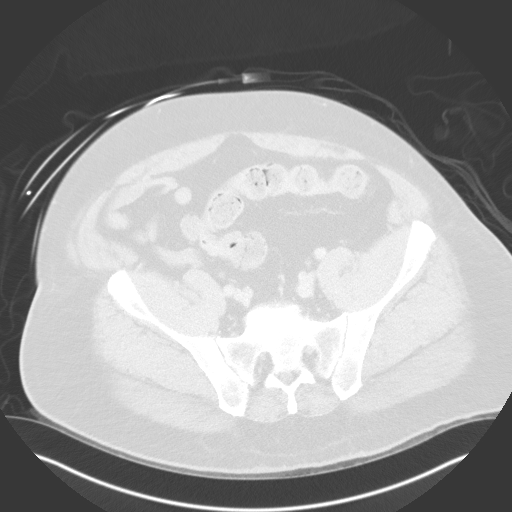
[im 57/113  soft-tissue]
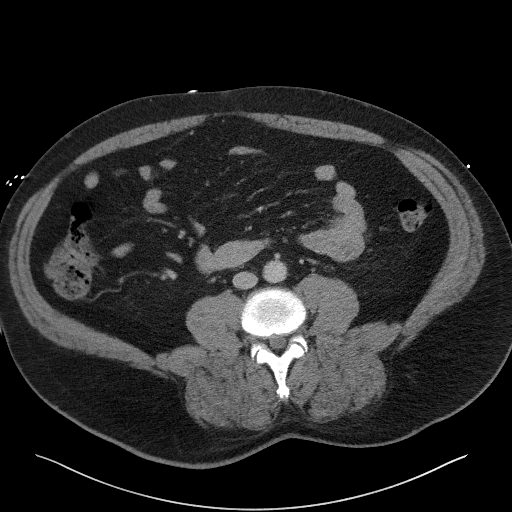
[im 57/113  lung]
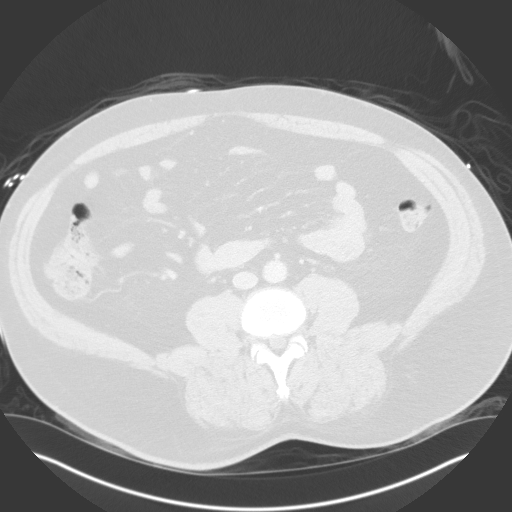
[im 75/113  soft-tissue]
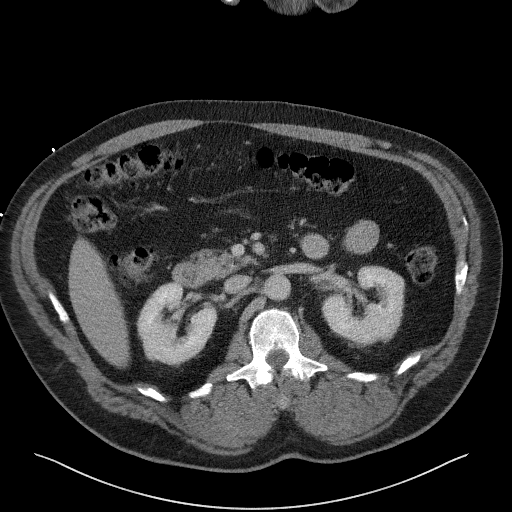
[im 75/113  lung]
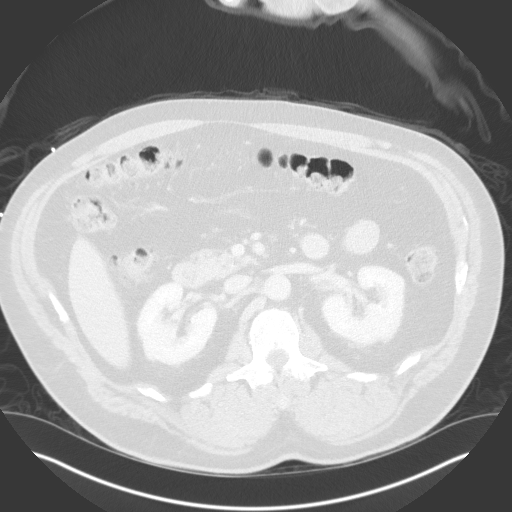
[im 94/113  soft-tissue]
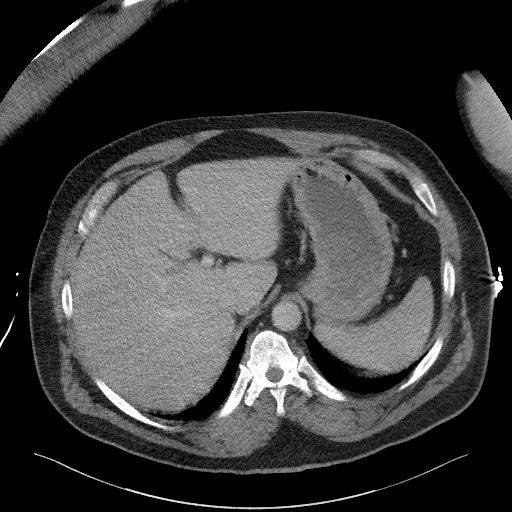
[im 94/113  lung]
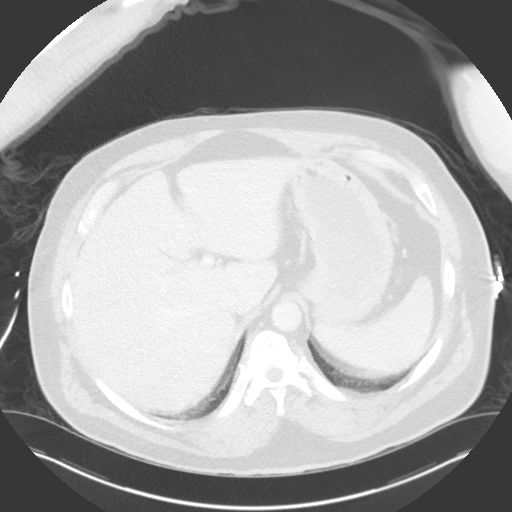

[Series 10: coronals · coronal · 0.98mm/px · 2 of 166 slices shown, 3 images]
[im 56/166  soft-tissue]
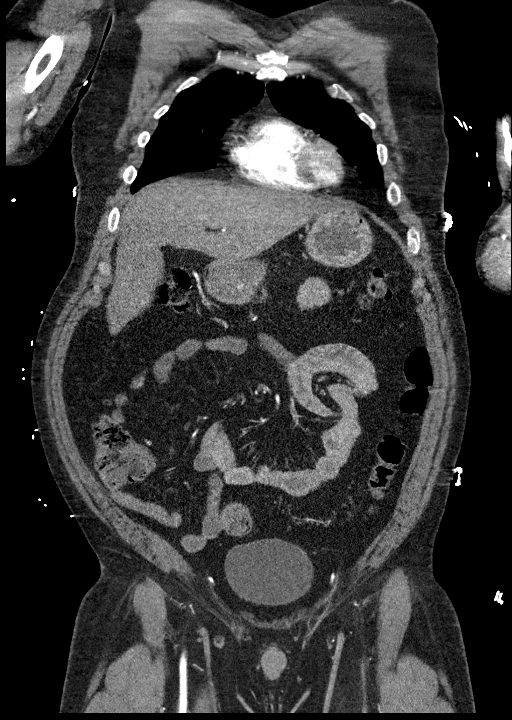
[im 56/166  bone]
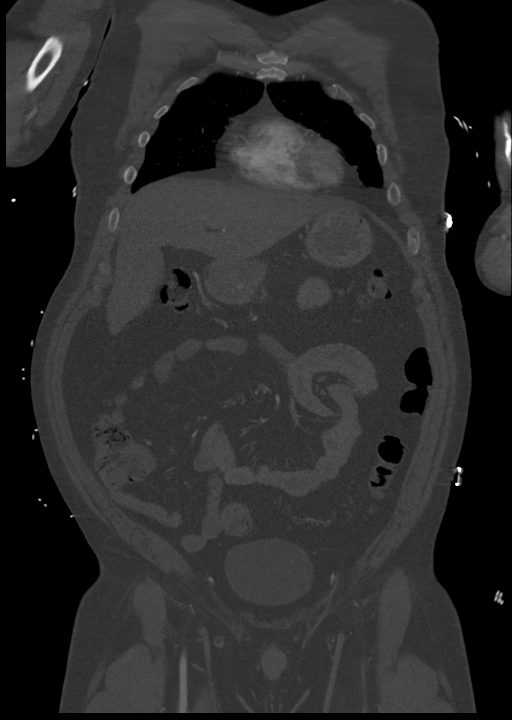
[im 111/166  soft-tissue]
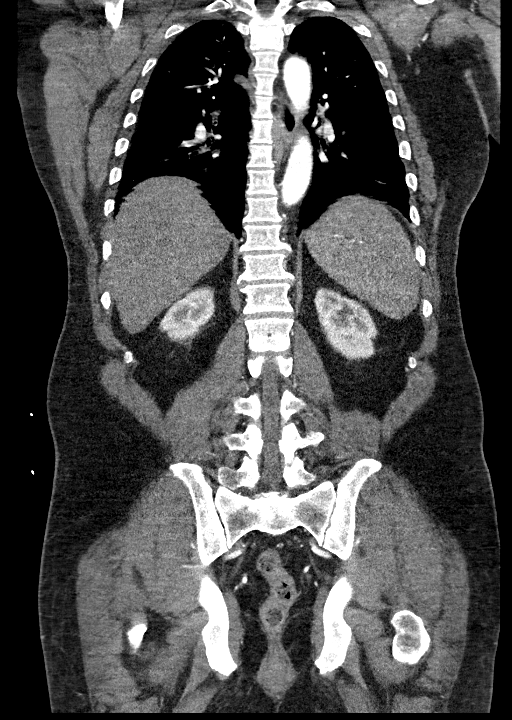

[Series 17: axial arterial · axial · arterial · 0.88mm/px · z∈[-631,-376]mm · 4 of 237 slices shown]
[im 17/237  soft-tissue]
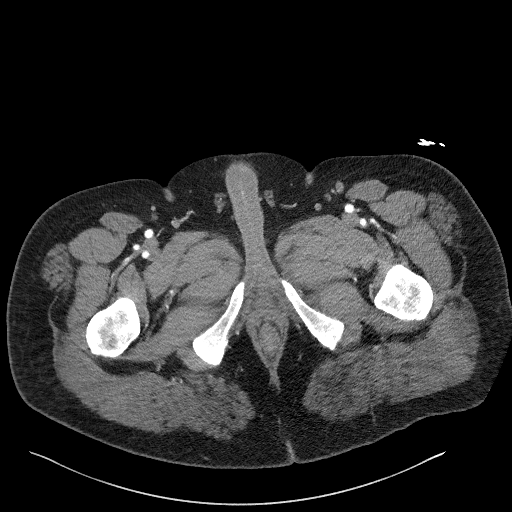
[im 51/237  soft-tissue]
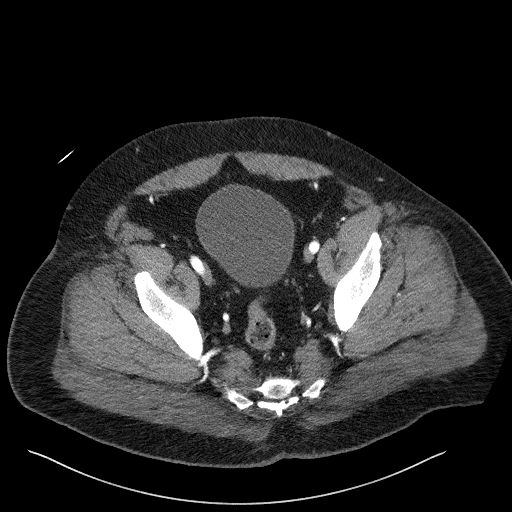
[im 85/237  soft-tissue]
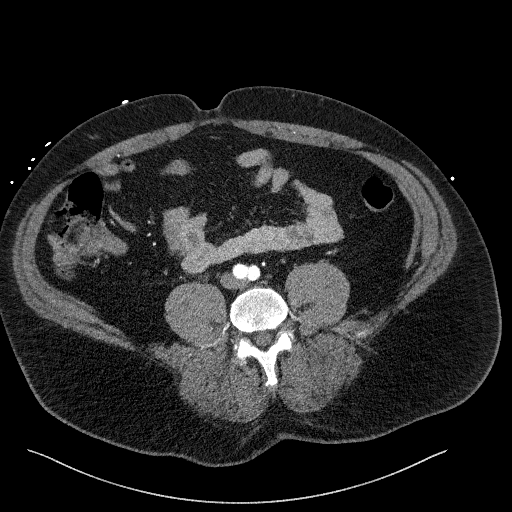
[im 102/237  soft-tissue]
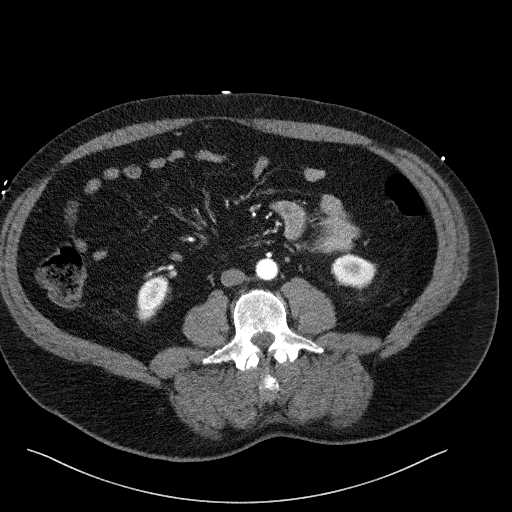

[11 of 46 positions shown; findings below may reference images not displayed]

FINDINGS: CTA CHEST FINDINGS

Cardiovascular: Preferential opacification of the thoracic aorta. No
evidence of thoracic aortic aneurysm or dissection. Normal heart
size. No pericardial effusion. Pulmonary arteries are well
opacified. No embolus.

Mediastinum/Nodes: No enlarged mediastinal, hilar, or axillary lymph
nodes. Thyroid gland, trachea, and esophagus demonstrate no
significant findings.

Lungs/Pleura: Lungs are clear. No pleural effusion or pneumothorax.

Musculoskeletal: No chest wall abnormality. No acute or significant
osseous findings.

Review of the MIP images confirms the above findings.

CTA ABDOMEN AND PELVIS FINDINGS

VASCULAR

Aorta: Normal caliber aorta without aneurysm, dissection, vasculitis
or significant stenosis.

Celiac: Patent without evidence of aneurysm, dissection, vasculitis
or significant stenosis.

SMA: Patent without evidence of aneurysm, dissection, vasculitis or
significant stenosis.

Renals: Accessory renal arteries bilaterally. All renal arteries are
patent without evidence of aneurysm, dissection, vasculitis,
fibromuscular dysplasia or significant stenosis.

IMA: Patent without evidence of aneurysm, dissection, vasculitis or
significant stenosis.

Inflow: Patent without evidence of aneurysm, dissection, vasculitis
or significant stenosis.

Veins: No obvious venous abnormality within the limitations of this
arterial phase study.

Review of the MIP images confirms the above findings.

NON-VASCULAR

Hepatobiliary: No focal liver abnormality is seen. Status post
cholecystectomy. No biliary dilatation.

Pancreas: Unremarkable. No pancreatic ductal dilatation or
surrounding inflammatory changes.

Spleen: No splenic injury or perisplenic hematoma.

Adrenals/Urinary Tract: Adrenal glands are unremarkable. Kidneys are
normal, without renal calculi, focal lesion, or hydronephrosis.
Bladder is unremarkable.

Stomach/Bowel: Stomach is within normal limits. Appendectomy.
Remainder of the colon appears normal. No evidence of bowel wall
thickening, distention, or inflammatory changes.

Lymphatic: No significant vascular findings are present. No enlarged
abdominal or pelvic lymph nodes.

Reproductive: Prostate is unremarkable.

Other: Small fat containing umbilical hernia. No peritoneal blood or
free air.

Musculoskeletal: No acute fracture is evident

Review of the MIP images confirms the above findings.
IMPRESSION: 1. No significant vascular abnormality in the chest, abdomen or
pelvis.
2. No evidence of significant acute traumatic injury in the chest,
abdomen or pelvis.

## 2018-05-10 LAB — CULTURE, BLOOD (ROUTINE X 2)
Culture: NO GROWTH
Special Requests: ADEQUATE

## 2018-05-13 ENCOUNTER — Emergency Department: Payer: Medicaid Other

## 2018-05-13 ENCOUNTER — Other Ambulatory Visit: Payer: Self-pay

## 2018-05-13 ENCOUNTER — Encounter: Payer: Self-pay | Admitting: Emergency Medicine

## 2018-05-13 ENCOUNTER — Emergency Department
Admission: EM | Admit: 2018-05-13 | Discharge: 2018-05-13 | Disposition: A | Discharge status: Home / Self Care | Payer: Medicaid Other | Attending: Emergency Medicine | Admitting: Emergency Medicine

## 2018-05-13 DIAGNOSIS — Z8673 Personal history of transient ischemic attack (TIA), and cerebral infarction without residual deficits: Secondary | ICD-10-CM | POA: Diagnosis not present

## 2018-05-13 DIAGNOSIS — Z760 Encounter for issue of repeat prescription: Secondary | ICD-10-CM | POA: Insufficient documentation

## 2018-05-13 DIAGNOSIS — R0602 Shortness of breath: Secondary | ICD-10-CM | POA: Diagnosis present

## 2018-05-13 DIAGNOSIS — F17210 Nicotine dependence, cigarettes, uncomplicated: Secondary | ICD-10-CM | POA: Insufficient documentation

## 2018-05-13 DIAGNOSIS — Z79899 Other long term (current) drug therapy: Secondary | ICD-10-CM | POA: Insufficient documentation

## 2018-05-13 DIAGNOSIS — J441 Chronic obstructive pulmonary disease with (acute) exacerbation: Secondary | ICD-10-CM | POA: Insufficient documentation

## 2018-05-13 DIAGNOSIS — Z7982 Long term (current) use of aspirin: Secondary | ICD-10-CM | POA: Insufficient documentation

## 2018-05-13 DIAGNOSIS — Z859 Personal history of malignant neoplasm, unspecified: Secondary | ICD-10-CM | POA: Diagnosis not present

## 2018-05-13 DIAGNOSIS — I2510 Atherosclerotic heart disease of native coronary artery without angina pectoris: Secondary | ICD-10-CM | POA: Diagnosis not present

## 2018-05-13 DIAGNOSIS — I10 Essential (primary) hypertension: Secondary | ICD-10-CM | POA: Diagnosis not present

## 2018-05-13 LAB — CBC WITH DIFFERENTIAL/PLATELET
Basophils Absolute: 0.1 10*3/uL (ref 0–0.1)
Basophils Relative: 1 %
EOS ABS: 0.6 10*3/uL (ref 0–0.7)
EOS PCT: 5 %
HCT: 51.8 % (ref 40.0–52.0)
HEMOGLOBIN: 17.8 g/dL (ref 13.0–18.0)
LYMPHS ABS: 3.4 10*3/uL (ref 1.0–3.6)
Lymphocytes Relative: 30 %
MCH: 31.2 pg (ref 26.0–34.0)
MCHC: 34.3 g/dL (ref 32.0–36.0)
MCV: 90.9 fL (ref 80.0–100.0)
Monocytes Absolute: 0.8 10*3/uL (ref 0.2–1.0)
Monocytes Relative: 7 %
NEUTROS PCT: 57 %
Neutro Abs: 6.5 10*3/uL (ref 1.4–6.5)
PLATELETS: 192 10*3/uL (ref 150–440)
RBC: 5.71 MIL/uL (ref 4.40–5.90)
RDW: 13.5 % (ref 11.5–14.5)
WBC: 11.5 10*3/uL — AB (ref 3.8–10.6)

## 2018-05-13 LAB — BASIC METABOLIC PANEL
ANION GAP: 7 (ref 5–15)
BUN: 14 mg/dL (ref 6–20)
CHLORIDE: 105 mmol/L (ref 101–111)
CO2: 28 mmol/L (ref 22–32)
Calcium: 9.2 mg/dL (ref 8.9–10.3)
Creatinine, Ser: 0.8 mg/dL (ref 0.61–1.24)
Glucose, Bld: 123 mg/dL — ABNORMAL HIGH (ref 65–99)
POTASSIUM: 4.4 mmol/L (ref 3.5–5.1)
SODIUM: 140 mmol/L (ref 135–145)

## 2018-05-13 LAB — TROPONIN I

## 2018-05-13 MED ORDER — LISINOPRIL 10 MG PO TABS
20.0000 mg | ORAL_TABLET | Freq: Once | ORAL | Status: AC
  Administered 2018-05-13: 20 mg via ORAL
  Filled 2018-05-13: qty 2

## 2018-05-13 MED ORDER — ATORVASTATIN CALCIUM 40 MG PO TABS: 40.0000 mg | ORAL_TABLET | Freq: Every day | ORAL | 1 refills | Status: DC

## 2018-05-13 MED ORDER — MIRTAZAPINE 15 MG PO TABS: 15.0000 mg | ORAL_TABLET | Freq: Every day | ORAL | 1 refills | Status: DC

## 2018-05-13 MED ORDER — ASPIRIN 81 MG PO TBEC: 81.0000 mg | DELAYED_RELEASE_TABLET | Freq: Every day | ORAL | 1 refills | Status: DC

## 2018-05-13 MED ORDER — LISINOPRIL 20 MG PO TABS: 20.0000 mg | ORAL_TABLET | Freq: Every day | ORAL | 1 refills | Status: DC

## 2018-05-13 MED ORDER — HYDROXYZINE HCL 50 MG PO TABS: 50.0000 mg | ORAL_TABLET | Freq: Four times a day (QID) | ORAL | 1 refills | Status: DC | PRN

## 2018-05-13 MED ORDER — LORAZEPAM 1 MG PO TABS
1.0000 mg | ORAL_TABLET | ORAL | Status: AC
  Administered 2018-05-13: 1 mg via ORAL
  Filled 2018-05-13: qty 1

## 2018-05-13 MED ORDER — OMEPRAZOLE 20 MG PO CPDR: 20.0000 mg | DELAYED_RELEASE_CAPSULE | Freq: Every day | ORAL | 1 refills | Status: DC

## 2018-05-13 MED ORDER — ALBUTEROL SULFATE HFA 108 (90 BASE) MCG/ACT IN AERS: 2.0000 | INHALATION_SPRAY | Freq: Four times a day (QID) | RESPIRATORY_TRACT | 2 refills | Status: DC | PRN

## 2018-05-13 MED ORDER — QUETIAPINE FUMARATE ER 400 MG PO TB24: 400.0000 mg | ORAL_TABLET | Freq: Every day | ORAL | 1 refills | Status: DC

## 2018-05-13 MED ORDER — ESCITALOPRAM OXALATE 10 MG PO TABS: 10.0000 mg | ORAL_TABLET | Freq: Every day | ORAL | 1 refills | Status: DC

## 2018-05-13 MED ORDER — IPRATROPIUM-ALBUTEROL 0.5-2.5 (3) MG/3ML IN SOLN
3.0000 mL | Freq: Once | RESPIRATORY_TRACT | Status: AC
  Administered 2018-05-13: 3 mL via RESPIRATORY_TRACT
  Filled 2018-05-13: qty 3

## 2018-05-13 MED ORDER — BUSPIRONE HCL 10 MG PO TABS: 10.0000 mg | ORAL_TABLET | Freq: Three times a day (TID) | ORAL | 1 refills | Status: DC

## 2018-05-13 MED ORDER — GABAPENTIN 300 MG PO CAPS: 300.0000 mg | ORAL_CAPSULE | Freq: Three times a day (TID) | ORAL | 1 refills | Status: DC

## 2018-05-13 NOTE — ED Notes (Signed)

## 2018-05-13 NOTE — ED Notes (Signed)
BEHAVIORAL HEALTH ROUNDING Patient sleeping: No. Patient alert and oriented: yes Behavior appropriate: Yes.  ; If no, describe:  Nutrition and fluids offered: yes Toileting and hygiene offered: Yes  Sitter present: q15 minute observations and security monitoring Law enforcement present: Yes    

## 2018-05-13 NOTE — ED Triage Notes (Signed)
Pt to ED reporting he needs a Seroquel refill. He denies needing his anxiety medications but has been 4 days without Seroquel and has been having worsening sleep and anxiety. [facility] but the doctor is out for the next 2 weeks and is unable to have medications refilled.  Pt denies suicidal or homicidal ideations.

## 2018-05-13 NOTE — ED Triage Notes (Signed)
Pt reports that he gets treatment from [place]. He went today the MD is not in and refused to see him because he is non compliant. Pt sts that he is compliant with appts and medications. Pt seems anxious and agitated and wants to get seen before things escalate.

## 2018-05-13 NOTE — ED Provider Notes (Signed)
[facility] Emergency Department Provider Note  ____________________________________________  Time seen: Approximately 1:19 PM  I have reviewed the triage vital signs and the nursing notes.   HISTORY  Chief Complaint Medication Refill and Shortness of Breath   HPI Ian [name] is a 57 y.o. male with a history of bipolar disorder, coronary artery disease, hypertension, prediabetic who presents for shortness of breath and requesting medication refill.  Patient reports that he has been off of his psych medications for 4 days.  He denies suicidal homicidal ideation.  He is unable to get in with his doctor for the next 2 weeks.  Is also complaining of shortness of breath and chest tightness which he has had for a few days.  The symptoms are constant and mild.  Patient is a heavy smoker.  He used to have inhalers however he ran out of that a year ago.  He has a mild dry cough which is his chronic cough.  He denies fever or chills, nausea or vomiting, diarrhea, abdominal pain.  Past Medical History:  Diagnosis Date  . Anxiety   . Barrett esophagus   . Cancer ([place])   . Coronary artery disease   . Depression   . GERD (gastroesophageal reflux disease)   . Hypertension   . Pre-diabetes   . Stroke [facility]) 2014   "mini-stroke" per patient    Patient Active Problem List   Diagnosis Date Noted  . Leukocytosis 05/04/2018  . Severe recurrent major depression without psychotic features ([place]) 11/05/2017  . Dyslipidemia 01/07/2017  . Barrett's esophagus 01/07/2017  . Bipolar I disorder, most recent episode depressed with anxious distress ([place]) 01/06/2017  . Cannabis use disorder, moderate, dependence ([place]) 01/06/2017  . Tobacco use disorder 01/06/2017  . Alcohol use disorder, moderate, dependence ([place]) 10/06/2016  . Substance induced mood disorder ([place]) 10/06/2016  . Cocaine use disorder, moderate, dependence ([place]) 10/06/2016  . Depression 10/06/2016  . Acute  left-sided weakness 05/03/2015  . CVA (cerebral infarction) 05/03/2015  . Weakness 11/16/2013  . Chest pain 11/16/2013  . HTN (hypertension) 11/16/2013  . Left-sided weakness 11/16/2013  . Torsades de pointes ([place]) 11/16/2013  . Hemiplegia, unspecified, affecting nondominant side 11/15/2013  . Speech and language deficits 11/15/2013    Past Surgical History:  Procedure Laterality Date  . ANGIOPLASTY    . APPENDECTOMY    . CARDIAC CATHETERIZATION    . CHOLECYSTECTOMY    . WRIST SURGERY Right    age 17    Prior to Admission medications   Medication Sig Start Date End Date Taking? Authorizing Provider  albuterol (PROVENTIL HFA;VENTOLIN HFA) 108 (90 Base) MCG/ACT inhaler Inhale 2 puffs into the lungs every 6 (six) hours as needed for wheezing or shortness of breath. 05/13/18   Alfred Levins, [place], MD  aspirin 81 MG EC tablet Take 1 tablet (81 mg total) by mouth daily. 05/13/18   [name], [name], MD  atorvastatin (LIPITOR) 40 MG tablet Take 1 tablet (40 mg total) by mouth daily at 6 PM. 05/13/18 07/12/18  [name], [name], MD  busPIRone (BUSPAR) 10 MG tablet Take 1 tablet (10 mg total) by mouth 3 (three) times daily. 05/13/18   [name], [name], MD  clonazePAM (KLONOPIN) 0.5 MG tablet Take 0.5 mg by mouth at bedtime.  03/19/18   [provider]  escitalopram (LEXAPRO) 10 MG tablet Take 1 tablet (10 mg total) by mouth daily. 05/13/18   [name], [name], MD  gabapentin (NEURONTIN) 300 MG capsule Take 1  capsule (300 mg total) by mouth 3 (three) times daily. 05/13/18 07/12/18  [name], [name], MD  hydrOXYzine (ATARAX/VISTARIL) 50 MG tablet Take 1 tablet (50 mg total) by mouth every 6 (six) hours as needed for anxiety. 05/13/18   [name], [name], MD  lisinopril (PRINIVIL,ZESTRIL) 20 MG tablet Take 1 tablet (20 mg total) by mouth daily. 05/13/18   [name], [name], MD  mirtazapine (REMERON) 15 MG tablet Take 1 tablet (15 mg total) by mouth at bedtime. 05/13/18   [name], [name], MD  omeprazole  (PRILOSEC) 20 MG capsule Take 1 capsule (20 mg total) by mouth daily. 05/13/18   [name], [name], MD  QUEtiapine (SEROQUEL XR) 400 MG 24 hr tablet Take 1 tablet (400 mg total) by mouth at bedtime. 05/13/18   [name], [name], MD    Allergies Fluoxetine; Hydrocodone-acetaminophen; Mirtazapine; Gabapentin; and Tramadol  Family History  Problem Relation Age of Onset  . Stroke Father   . Leukemia Father   . Breast cancer Sister     Social History Social History   Tobacco Use  . Smoking status: Current Every Day Smoker    Packs/day: 1.00    Types: Cigarettes  . Smokeless tobacco: Never Used  Substance Use Topics  . Alcohol use: Yes    Comment: denies being a daily drinker but did have alcohol today  . Drug use: Yes    Types: Cocaine, Marijuana    Comment: used cocaine today; denies smoking marijuana for years    Review of Systems  Constitutional: Negative for fever. Eyes: Negative for visual changes. ENT: Negative for sore throat. Neck: No neck pain  Cardiovascular: + chest tightness Respiratory: + shortness of breath. Gastrointestinal: Negative for abdominal pain, vomiting or diarrhea. Genitourinary: Negative for dysuria. Musculoskeletal: Negative for back pain. Skin: Negative for rash. Neurological: Negative for headaches, weakness or numbness. Psych: No SI or HI  ____________________________________________   PHYSICAL EXAM:  VITAL SIGNS: ED Triage Vitals  Enc Vitals Group     BP 05/13/18 1216 (!) 195/114     Pulse Rate 05/13/18 1216 92     Resp 05/13/18 1216 18     Temp 05/13/18 1216 98 F (36.7 C)     Temp Source 05/13/18 1216 Oral     SpO2 05/13/18 1216 94 %     Weight 05/13/18 1223 279 lb (126.6 kg)     Height 05/13/18 1223 6' (1.829 m)     Head Circumference --      Peak Flow --      Pain Score 05/13/18 1222 0     Pain Loc --      Pain Edu? --      Excl. in [place]? --     Constitutional: Alert and oriented. Well appearing and in no apparent  distress. HEENT:      Head: Normocephalic and atraumatic.         Eyes: Conjunctivae are normal. Sclera is non-icteric.       Mouth/Throat: Mucous membranes are moist.       Neck: Supple with no signs of meningismus. Cardiovascular: Regular rate and rhythm. No murmurs, gallops, or rubs. 2+ symmetrical distal pulses are present in all extremities. No JVD. Respiratory: Normal respiratory effort, normal sats, diffuse expiratory wheezes, decrease air movement. Gastrointestinal: Soft, non tender, and non distended with positive bowel sounds. No rebound or guarding. Musculoskeletal: Nontender with normal range of motion in all extremities. No edema, cyanosis, or erythema of extremities. Neurologic: Normal speech and language.  Face is symmetric. Moving all extremities. No gross focal neurologic deficits are appreciated. Skin: Skin is warm, dry and intact. No rash noted. Psychiatric: Mood and affect are normal. Speech and behavior are normal.  ____________________________________________   LABS (all labs ordered are listed, but only abnormal results are displayed)  Labs Reviewed  CBC WITH DIFFERENTIAL/PLATELET - Abnormal; Notable for the following components:      Result Value   WBC 11.5 (*)    All other components within normal limits  BASIC METABOLIC PANEL - Abnormal; Notable for the following components:   Glucose, Bld 123 (*)    All other components within normal limits  TROPONIN I   ____________________________________________  EKG  ED ECG REPORT I, Rudene Re, the attending physician, personally viewed and interpreted this ECG.  Normal sinus rhythm, rate of 82, normal intervals, normal axis, no ST elevations or depressions.  Normal EKG unchanged from prior ____________________________________________  RADIOLOGY  I have personally reviewed the images performed during this visit and I agree with the Radiologist's read.   Interpretation by Radiologist:  Dg Chest 2  View  Result Date: 05/13/2018 CLINICAL DATA:  Chest pain and shortness of breath. History of esophageal carcinoma EXAM: CHEST - 2 VIEW COMPARISON:  May 04, 2018 chest radiograph and chest CT July 12, 2017 FINDINGS: There is no edema or consolidation. There is stable reticulonodular interstitial thickening throughout the lungs bilaterally. Heart size and pulmonary vascularity are normal. No evident adenopathy. No bone lesions. IMPRESSION: Stable reticulonodular interstitial thickening throughout the lungs bilaterally. No edema or consolidation. No mass or adenopathy evident. Heart size normal. Electronically Signed   By: Lowella Grip III M.D.   On: 05/13/2018 14:35      ____________________________________________   PROCEDURES  Procedure(s) performed: None Procedures Critical Care performed:  None ____________________________________________   INITIAL IMPRESSION / ASSESSMENT AND PLAN / ED COURSE  57 y.o. male with a history of bipolar disorder, coronary artery disease, hypertension, prediabetic who presents for shortness of breath and requesting medication refill.   # chest tightness and SOB: Presentation consistent with mild COPD exacerbation with wheezes and slightly decreased air movement.  Patient has normal work of breathing and normal sats.  He is a heavy smoker.  Chest x-ray has been ordered to rule out pneumonia.  Patient looks euvolemic with no evidence of CHF.  No clinical suspicion for dissection or pulmonary embolism.  Will give DuoNeb and reassess.  Patient was evaluated by Dr. Weber Cooks who is very familiar with this patient.  According to him patient is slightly manic therefore we will hold off steroids at this time not to contribute to patient's psychiatric symptoms.  Psychiatry is does not think patient need admission to the hospital or involuntary commitment as he has no suicidal homicidal thoughts.  He has provided patient with a refill for his medications.  Labs are  pending.  EKG with no evidence of ischemia or dysrhythmias.  Chest x-ray is pending.    _________________________ 2:41 PM on 05/13/2018 -----------------------------------------  After 1 DuoNeb patient is now moving good air, no longer wheezing.  He reports full resolution of his shortness of breath and chest tightness.  Chest x-ray with no evidence of pneumonia or pneumothorax.  Labs are all within normal limits.Patient is clear for discharge.  Will provide on albuterol inhaler.  Smoking cessation provided to patient.   As part of my medical decision making, I reviewed the following data within the [place] notes reviewed and incorporated, Labs  reviewed , EKG interpreted , Old EKG reviewed, Old chart reviewed, Radiograph reviewed , A consult was requested and obtained from this/these consultant(s) Psychiatry, Notes from prior ED visits and [place] Controlled Substance Database    Pertinent labs & imaging results that were available during my care of the patient were reviewed by me and considered in my medical decision making (see chart for details).    ____________________________________________   FINAL CLINICAL IMPRESSION(S) / ED DIAGNOSES  Final diagnoses:  COPD exacerbation ([place])  Medication refill      NEW MEDICATIONS STARTED DURING THIS VISIT:  ED Discharge Orders        Ordered    aspirin 81 MG EC tablet  Daily     05/13/18 1320    lisinopril (PRINIVIL,ZESTRIL) 20 MG tablet  Daily     05/13/18 1320    atorvastatin (LIPITOR) 40 MG tablet  Daily-1800     05/13/18 1320    busPIRone (BUSPAR) 10 MG tablet  3 times daily     05/13/18 1320    escitalopram (LEXAPRO) 10 MG tablet  Daily     05/13/18 1320    hydrOXYzine (ATARAX/VISTARIL) 50 MG tablet  Every 6 hours PRN     05/13/18 1320    mirtazapine (REMERON) 15 MG tablet  Daily at bedtime     05/13/18 1320    QUEtiapine (SEROQUEL XR) 400 MG 24 hr tablet  Daily at bedtime     05/13/18 1320     omeprazole (PRILOSEC) 20 MG capsule  Daily     05/13/18 1320    gabapentin (NEURONTIN) 300 MG capsule  3 times daily     05/13/18 1320    albuterol (PROVENTIL HFA;VENTOLIN HFA) 108 (90 Base) MCG/ACT inhaler  Every 6 hours PRN     05/13/18 1440       Note:  This document was prepared using Dragon voice recognition software and may include unintentional dictation errors.    Alfred Levins, [place], MD 05/13/18 [phone]

## 2018-05-13 NOTE — Discharge Instructions (Signed)

## 2018-08-14 ENCOUNTER — Other Ambulatory Visit: Payer: Self-pay | Admitting: Psychiatry

## 2018-09-20 ENCOUNTER — Other Ambulatory Visit: Payer: Self-pay | Admitting: Psychiatry

## 2018-09-20 ENCOUNTER — Ambulatory Visit: Admit: 2018-09-20 | Discharge: 2018-09-21 | Payer: MEDICAID | Attending: Gastroenterology | Primary: Gastroenterology

## 2018-09-20 DIAGNOSIS — C801 Malignant (primary) neoplasm, unspecified: Secondary | ICD-10-CM

## 2018-09-20 DIAGNOSIS — B1920 Unspecified viral hepatitis C without hepatic coma: Principal | ICD-10-CM

## 2018-09-20 DIAGNOSIS — K740 Hepatic fibrosis: Secondary | ICD-10-CM

## 2018-09-20 NOTE — Unmapped (Signed)
Counseling for HCV treatment     B18.2 Hep C: yes    K74.60 Cirrhosis: no,   Child Pugh Score if applicable and for Medicaid pts: n/a  Z94.4 Liver Transplant: no    Genotype: 1a  (Ref Att 08/03/18 pg 5)  HCV RNA:  1,610,960 IU/ml on 07/28/18 (Ref Att 07/28/18 pg 1)  Fibrosis score: F3 (11.2 kPa) on 09/20/18  HIV Co-infection? no  Signs of liver decompensation? no  Previous treatment? naive    Planned regimen: Epclusa (sofosbuvir/velpatasvir 400/100mg ) x 12 weeks  Reason for NF: on quetiapine which interacts with Mavyret.  Urgency: Routine Request  Prescribing Provider/NPI: Dr. Chip Boer / [id number]  Please see Media tab under Chart Review on date 09/20/18 for patient signature/waiver forms.  Insurance: [place] Medicaid    Douglas Schultz is a 57 y.o. presents to clinic, sister, Douglas Schultz and is interested in starting treatment with Epclusa. We discussed the prior authorization (PA) process of obtaining the medication through insurance and that this may take some time.  Has mental health peer support, Douglas Schultz [phone] who may be able to provide transportation.    Current medications:  Current Outpatient Medications   Medication Sig Dispense Refill   ??? ibuprofen (ADVIL,MOTRIN) 200 MG tablet Take 400 mg by mouth every six (6) hours as needed for pain.     ??? lisinopril (PRINIVIL,ZESTRIL) 10 MG tablet Take 10 mg by mouth daily.     ??? omeprazole (PRILOSEC) 20 MG capsule Take 20 mg by mouth daily.     ??? QUEtiapine (SEROQUEL) 300 MG tablet Take 300 mg by mouth nightly.       Following topics were discussed during counseling:    1. Indications for medication, dosage and administration.     A. Epclusa 400/100mg  1 tablet to take daily with or without food.    2. Common side effects of medications and management strategies. (fatigue, headache)    3. Importance of adherence to regimen, follow-up clinic visits and lab monitoring.     A.Asked patient to call Douglas Schultz at [phone] before starting treatment to establish start date and to schedule TW#4 appointment.    4. Drug-drug interaction.    A. Current medications have been reviewed and assessed for possible interaction.     - omeprazole 20mg :  We discussed the mechanism of drug-drug interaction with acid lowering agents. [name] should be taken with food 4 hours prior to PPI (max dose omeprazole 20mg  equivalent). Pt unsure if he can remember to take Epclusa 4 hours prior to [name]. Pt prefers to take all his meds at same time in the evening.  Will trial famotidine this week to see if that can control his GERD. Instructed to call back if famotidine does not control his GERD so we can review Epclusa and PPI administration instruction.     B.  Advised to check with MD or pharmacist before taking any OTC/herbal medications, with emphasis regarding indigestion/heartburn medications.  Denies use of herbal medication such as milk thistle or [place] John's wart.  Allergies have been verified. No alcohol for past 3 weeks. Reviewed effects of alcohol on HCV treatment and liver. Recommended abstinence. Pt in agreement.    5. Importance of informing pharmacy and clinic of updated contact information.  Discussed the process of obtaining medication through specialty pharmacy and when approved medication will be delivered to patient's home. Stressed importance of being able to be reached over the phone.   6. Med Access: Beneficiary Readiness Evaluation form signed  during clinic.  Pt will need TW#4 HCV RNA submitted to Medicaid to get full treatment duration (Continuation PA) approved. Stressed importance of TW#4 visit to patient.    Patient verbalized understanding. Provided contact information for any questions/concerns.       Park Breed, Pharm D., BCPS, BCGP, CPP  [facility]  [address]  [place], [place] [address]  [phone]    This portion of the visit was 20 minutes in duration and greater than 50% was spend in direct counseling and coordination of care regarding hepatitis C medication management.

## 2018-09-20 NOTE — Unmapped (Signed)
[facility]  FAST ??? Fibrosis Assessment Team  Division of Gastroenterology and Hepatology  ??  ??    ??  FIBROSCAN will be performed to assess hepatic fibrosis (scarring) in order to stage this patient's liver disease. This will assist with evaluating the natural course of the disease and will provide important information regarding prognosis, duration of therapy, and potential response to treatment. This information will also help assess risk for hepatocellular carcinoma and need for liver cancer surveillance.??  ??  FibroscanProcedure:   After obtaining verbal consent, the patient was placed in a supine position. Physical characteristics and landmarks were assessed to establish appropriate mid-axillary intercostal space for probe placement. 50Hz  Shear Wave pulses were applied and the resulting Shear Wave and Propagation Speed detected with a 3.5 MHz ultrasonic signal, using the FibroScan probe.  Skin to liver capsule distance and liver parenchyma were accessed during the entire examination with the FibroScan probe. The patient was instructed to breathe normally and to abstain from sudden movements during the procedure, allowing for random measurements of liver stiffness. At least ten Shear Waves were produced; individual measurements of each Shear Wave were calculated. Patient tolerated the procedure well and was discharged without incident.  ??  Probe used []  M+    Serial # [id number]                         [x]  XL+   Serial # [id number]  ??  Main Etiology of Liver Disease:  [x] HCV     [] HBV   [] Alcohol    [] NASH  [] PBC      [] PSC     [] Other________________  ??  50Hz  shear wave pulses were applied and the resulting shear wave and propagation speed detected with a 3.5 MHz ultrasonic signal, using the FibroScan probe.     ??  At least ten Shear Waves were produced; individual measurements of each shear wave were calculated.    ??  Patient tolerated the procedure well and was discharged without incident.  ??  Fibroscan score: ___11.2___kPa  ??  IQR:                       ___9___%  ??  Test performed by: Luiz Ochoa, RN  ??  [facility]  FAST ??? Fibrosis Assessment Team  Division of Gastroenterology and Hepatology  ??  ??    ??  ??  ??  Estimation of the stage of liver fibrosis (Metavir Score):  The results of the Liver Stiffness Score are consistent with the following liver fibrosis stage:  ??  ??  []  F0-F1             []  F2               []  F3               []  F4  ??  ??  GENERAL RECOMMENDATIONS ACCORDING TO THE STAGE OF LIVER FIBROSIS.  ??  F0-F1: No-minimal fibrosis. The risk of progression to advanced fibrosis and cirrhosis is low. If the cause of liver disease is not removed, a 1-2 yr follow-up study is recommended.   F2: Significant fibrosis. There is a moderate risk of progression to cirrhosis. If the cause of liver disease is not removed, a follow-up study in 12 months is recommended.  F3: Advanced (pre-cirrhotic stage). The risk of progression to cirrhosis is high. Imaging studies to rule out hepatocellular carcinoma  should be considered. Efforts to remove the cause of liver disease are highly recommended.  F4: Cirrhosis. There is significant risk of portal hypertension and esophageal varices. An upper endoscopy is recommended. Imaging studies for hepatocellular carcinoma screening are recommended.   ??  Any and all FibroScan studies must be carefully evaluated, taking fully into account all individual measurement/scans, patient history and other factors.  As with liver biopsy, any estimation of liver fibrosis may be subject to under or over staging due to sampling error.  Any further medical or surgical intervention should be made only while fully considering the circumstances of this patient and in consultation with this patient.    I have reviewed and interpreted the FIBROSCAN test results as described above.

## 2018-09-20 NOTE — Unmapped (Signed)
--   labs and imaging reviewed  -- plan to treat HCV with Epclusa X 12 weeks  -- Fibroscan today shows stage 3 scarring  -- will order an ultrasound to screen for liver cancer  -- no binge drinking while on this medication  -- meet with Park Breed PharmD today about getting Epclusa  -- Please call [facility] to check on prescription for Lisinopril 40mg  daily for blood pressure and Advair inhaler for COPD; if they do not have prescription, you need to call Dr. Ivory Broad office to have it resent; you also need to make a follow-up with them for a blood pressure check (nurse visit)

## 2018-09-20 NOTE — Unmapped (Signed)
Opened in error

## 2018-09-26 ENCOUNTER — Other Ambulatory Visit: Payer: Self-pay | Admitting: Psychiatry

## 2018-09-27 MED ORDER — EPCLUSA 400 MG-100 MG TABLET
ORAL_TABLET | Freq: Every day | ORAL | 2 refills | 0 days | Status: CP
Start: 2018-09-27 — End: 2019-04-27
  Filled 2018-10-04: qty 28, 28d supply, fill #0

## 2018-09-27 NOTE — Unmapped (Signed)
Per test claim for EPCLUSA 400-100 MG TABLET at the [facility], patient needs Medication Assistance Program for Prior Authorization.

## 2018-09-30 NOTE — Unmapped (Signed)
[facility] Ph [phone] Fax [phone]              Referring Provider:  Derrek Gu, MD  [address]  [facility] [place], [place] [address]     Primary Care Provider:  [facility]      PATIENT PROFILE:        Douglas Schultz is a 57 y.o. male (DOB: January 21, 1961) who is seen in consultation at the request of Dr. Ivory Broad for evaluation of chronic HCV genotype 1a, treatment naive.      HISTORY OF PRESENT ILLNESS: This is a 57 y.o. 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 The patient has undergone multiple EGDs for treatment of his adenocarcinoma, most recently on 10/17 where he underwent random biopsies of his Barrett's as well as EMR of a nodularity seen at the [facility]. He was being scoped and biopsied every 6 months but has fallen out of care with departure of Dr. Baldo Daub.      He presents today with his sister. Overall, he is without liver-related complaints. Patient denies any jaundice, pruritis, CP, increased abdominal girth, LE edema, melena, BRBPR, confusion, nausea, vomiting, or diarrhea. He reports that most of his high risk behavior for HCV was in the 1990's. He denies IVDU but endorses other street drug use. He does have a history of alcohol abuse. He had DUI's in 1980's and went to prison 3X. He does not drink daily but will drink 3-4 40 oz beers when he does drink. No alcohol in past 3 weeks. Denies withdrawal but may have had an alcohol related seizure in the past vs sequelae of his prior CVA. Binge drinker when off psych meds. He is under the care of psychiatry (meds being adjusted currently) and has noted that his weight has been going up steadily over past year on seroquel 300mg . He c/o dysphagia +/- regurgitation every 1-2 weeks. He has SOB with exertion and continues smoking daily. He feels like long acting seroquel makes his restless leg syndrome worse. He also c/o right arm tremor- worse since May- evaluated with brain MRI and CT of neck vessels as well as Echo.    Liver Care:  HCV genotype 1a, VL 1,739,096 iu/ml, naive  Fibroscan: 11.2 kPa (F3)  HCC screen: CTA 06/2017 neg  Immunization: HAV IgG neg; HBV immunity?  Fibrosis RFs: alcohol, obesity, insulin resistance      REVIEW OF SYSTEMS:     The balance of 12 systems reviewed is negative except as noted in the HPI.     PAST MEDICAL HISTORY:    Past Medical History:   Diagnosis Date   ??? Affective psychosis (CMS-HCC) 04/27/2013 - 05/02/2013   ??? Alcohol withdrawal syndrome (CMS-HCC) 04/27/2013 - 05/02/2013   ??? Anxiety and depression    ??? Arthritis    ??? Asthma    ??? Bipolar 1 disorder (CMS-HCC)    ??? CVA (cerebral vascular accident) (CMS-HCC) 08/01/2013   ??? Esophageal carcinoma (CMS-HCC)     signet ring cell adenocarcinoma, poorly differentiated, diagnosed 05/2012   ??? ETOH abuse    ??? H/O blood clots    ??? Hepatitis C    ??? High cholesterol    ??? HTN (hypertension)    ??? Prostate  cancer (CMS-HCC)     s/p seed implantation   ??? Recurrent major depression-severe (CMS-HCC)    ??? Substance abuse (CMS-HCC)     cocaine, marijuana   ??? TIA (transient ischemic attack) 02/03/2013 - 02/04/2013   ??? Tobacco abuse     1 ppd x41 years       PAST SURGICAL HISTORY:    Past Surgical History:   Procedure Laterality Date   ??? ABDOMINAL SURGERY     ??? APPENDECTOMY     ??? CHOLECYSTECTOMY     ??? HAND RECONSTRUCTION Right    ??? PR EDG TRANSORAL ENDOSCOPIC MUCOSAL RESECTION  08/26/2013    Procedure: ESOPHAGOGASTRODUODENOSCOPY, FLEXIBLE, TRANSORAL; WITH ENDOSCOPIC MUCOSAL RESECTION;  Surgeon: Clint Bolder, MD;  [facility];  Service: Gastroenterology   ??? PR EDG TRANSORAL ENDOSCOPIC MUCOSAL RESECTION  12/30/2013    Procedure: ESOPHAGOGASTRODUODENOSCOPY, FLEXIBLE, TRANSORAL; WITH ENDOSCOPIC MUCOSAL RESECTION;  Surgeon: Erskin Burnet, MD;  Location: [facility];  Service: Gastroenterology   ??? PR ENDOSCOPIC ULTRASOUND EXAM N/A 08/26/2013    Procedure: UGI ENDO; W/ENDO ULTRASOUND EXAM INCLUDES ESOPHAGUS, STOMACH, &/OR DUODENUM/JEJUNUM;  Surgeon: Clint Bolder, MD;  [facility]: [facility];  Service: Gastroenterology   ??? PR UPPER GI ENDOSCOPY,BIOPSY N/A 04/11/2014    Procedure: UGI ENDOSCOPY; WITH BIOPSY, SINGLE OR MULTIPLE;  Surgeon: Erskin Burnet, MD;  Location: [facility];  Service: Gastroenterology   ??? PR UPPER GI ENDOSCOPY,BIOPSY N/A 07/21/2014    Procedure: UGI ENDOSCOPY; WITH BIOPSY, SINGLE OR MULTIPLE;  Surgeon: Erskin Burnet, MD;  [facility];  Service: Gastroenterology   ??? PR UPPER GI ENDOSCOPY,BIOPSY N/A 02/19/2015    Procedure: UGI ENDOSCOPY; WITH BIOPSY, SINGLE OR MULTIPLE;  Surgeon: Erskin Burnet, MD;  Location: [facility];  Service: Gastroenterology   ??? PR UPPER GI ENDOSCOPY,BIOPSY N/A 05/25/2015    Procedure: UGI ENDOSCOPY; WITH BIOPSY, SINGLE OR MULTIPLE;  Surgeon: Erskin Burnet, MD;  [facility];  Service: Gastroenterology   ??? PR UPPER GI ENDOSCOPY,BIOPSY Left 09/07/2015    Procedure: UGI ENDOSCOPY; WITH BIOPSY, SINGLE OR MULTIPLE;  Surgeon: Erskin Burnet, MD;  [facility];  Service: Gastroenterology   ??? PR UPPER GI ENDOSCOPY,BIOPSY N/A 12/26/2015    Procedure: UGI ENDOSCOPY; WITH BIOPSY, SINGLE OR MULTIPLE;  Surgeon: Lanier Ensign, MD;  [facility]: [facility];  Service: Gastroenterology   ??? PR UPPER GI ENDOSCOPY,BIOPSY N/A 04/07/2016    Procedure: UGI ENDOSCOPY; WITH BIOPSY, SINGLE OR MULTIPLE;  Surgeon: Lanier Ensign, MD;  [facility];  Service: Gastroenterology   ??? PR UPPER GI ENDOSCOPY,BIOPSY N/A 10/13/2016    Procedure: UGI ENDOSCOPY; WITH BIOPSY, SINGLE OR MULTIPLE;  Surgeon: Lanier Ensign, MD;  Location: [facility];  Service: Gastroenterology   ??? PR UPPER GI ENDOSCOPY,DIAGNOSIS N/A 10/07/2013    Procedure: UGI ENDO, INCLUDE ESOPHAGUS, STOMACH, & DUODENUM &/OR JEJUNUM; DX W/WO COLLECTION SPECIMN, BY BRUSH OR WASH;  Surgeon: Erskin Burnet, MD;  Location: [facility];  Service: Gastroenterology   ??? PR UPPER GI ENDOSCOPY,DIAGNOSIS N/A 10/13/2013    Procedure: UGI ENDO, INCLUDE ESOPHAGUS, STOMACH, & DUODENUM &/OR JEJUNUM; DX W/WO COLLECTION SPECIMN, BY BRUSH OR WASH;  Surgeon: Tish Men, MD;  Location: [facility];  Service: Gastroenterology       MEDICATIONS:      Current Outpatient Medications:   ???  ibuprofen (  ADVIL,MOTRIN) 200 MG tablet, Take 400 mg by mouth every six (6) hours as needed for pain., Disp: , Rfl:   ???  lisinopril (PRINIVIL,ZESTRIL) 10 MG tablet, Take 10 mg by mouth daily., Disp: , Rfl:   ???  omeprazole (PRILOSEC) 20 MG capsule, Take 20 mg by mouth daily., Disp: , Rfl:   ???  QUEtiapine (SEROQUEL) 300 MG tablet, Take 300 mg by mouth nightly., Disp: , Rfl:   ???  clonazePAM (KLONOPIN) 1 MG tablet, Take 1 mg by mouth TID., Disp: , Rfl:   ???  EPCLUSA 400-100 mg tablet, Take 1 tablet by mouth daily., Disp: 28 tablet, Rfl: 2  ???  escitalopram oxalate (LEXAPRO) 20 MG tablet, Take 20 mg by mouth daily., Disp: , Rfl:   ???  PNV w/o calcium-iron fum-FA (M-VIT) 27-1 mg Tab, Take 1 tablet by mouth daily., Disp: , Rfl:     ALLERGIES:    Prozac [fluoxetine] and Vicodin [hydrocodone-acetaminophen]    SOCIAL HISTORY:    Social History     Socioeconomic History   ??? Marital status: Single     Spouse name: None   ??? Number of children: 0   ??? Years of education: None   ??? Highest education level: None   Occupational History     Employer: NOT EMPLOYED   Social Needs   ??? Financial resource strain: None   ??? Food insecurity:     Worry: None     Inability: None   ??? Transportation needs:     Medical: None Non-medical: None   Tobacco Use   ??? Smoking status: Current Every Day Smoker     Packs/day: 1.00     Years: 40.00     Pack years: 40.00     Types: Cigarettes   ??? Smokeless tobacco: Never Used   Substance and Sexual Activity   ??? Alcohol use: Yes     Alcohol/week: 12.0 standard drinks     Types: 12 Cans of beer per week     Drinks per session: 1 or 2     Comment: drank 15-20 beers wednes jun1   ??? Drug use: Not Currently     Types: Cocaine, Marijuana     Comment: April 8 or 9, 2017   ??? Sexual activity: None   Lifestyle   ??? Physical activity:     Days per week: None     Minutes per session: None   ??? Stress: None   Relationships   ??? Social connections:     Talks on phone: None     Gets together: None     Attends religious service: None     Active member of club or organization: None     Attends meetings of clubs or organizations: None     Relationship status: None   Other Topics Concern   ??? None   Social History Narrative   ??? None     Lives alone in [place], [place]. Does lawn care. Smokes 1ppd X 66yrs. Sister lives in [place], [place] and is with patient in clinic. Alcohol as above.    FAMILY HISTORY:    family history includes Breast cancer in his mother and sister; Cancer in his father, maternal aunt, mother, and sister.  No liver disease.      VITAL SIGNS:    BP 162/134  - Pulse 92  - Temp 36.3 ??C (97.3 ??F)  - Resp 20  - Ht 182.9 cm (6')  - Wt (!) 124.3 kg (274 lb)  -  SpO2 96%  - BMI 37.16 kg/m??   Body mass index is 37.16 kg/m??.    PHYSICAL EXAM:       Constitutional:   Alert, oriented x 3, no acute distress, well nourished   Mental Status:   Thought organized, appropriate affect, normal fluent speech.   HEENT:   PEERL, conjunctiva clear, anicteric, oropharynx clear, neck supple, no LAD.   Respiratory: Bilateral wheezing.     Cardiac: Regular rate and rhythm normal S1 and S2, no murmur.      Abdomen: Obese, soft, non-distended, non-tender, no organomegaly or masses.     Perianal/Rectal Exam Not performed.     Extremities: No edema, well perfused.   Musculoskeletal: No joint swelling or tenderness noted, no deformities.     Skin: No rashes, jaundice or skin lesions noted.     Neuro: No asterixis. Resting tremor left arm.         DIAGNOSTIC STUDIES:  I have reviewed all pertinent diagnostic studies, including:    GI Procedures:  EGD 09/2016 Grade B reflux esophagitis, no Barrett's esophagus  Fibroscan 09/20/18 11.2 kPa (F3)    Radiographic studies:  CTA 07/12/17 C/A/P   Hepatobiliary: No focal liver abnormality is seen. Status post cholecystectomy. No biliary dilatation.    Laboratory results:  Results for orders placed or performed during the hospital encounter of 10/13/16   Surgical pathology exam   Result Value Ref Range    Case Report       Surgical Pathology Report                         Case: [id number]                                 Authorizing Provider:  Lanier Ensign, MD    Collected:           10/13/2016 1641              Ordering Location:     [facility]            Received:            10/14/2016 1253              Pathologist:           Edwin Cap, MD                                                        Specimens:   A) - Esophagus, cardia r/o dysplasia and carcinoma                                                  B) - Esophagus, 42-40  r/o dysplasia and carcinoma                                         Final Diagnosis       A: Esophagus, cardia, biopsy  - Squamocolumnar junction and cardio-oxyntic mucosa with mild chronic superficial  gastritis and reactive foveolar hyperplasia  - No intestinal metaplasia or dysplasia identified    B: Esophagus, 42-40 cm, biopsy  - Squamous mucosa with no specific pathologic abnormality  - No glandular mucosa identified      Clinical History       Repeat upper for surveillance.      Gross Description       Received are two appropriately labeled containers.     Specimen A:    SITE:  Esophagus, cardia  METHOD: Biopsy  MEASURE: 5 x 4 x 2 mm  COMMENT: Aggregate of multiple fragments of friable tan tissue  BLOCK #: A1, NTR    Specimen B:    SITE:  Esophagus, 42-40 cm  METHOD: Biopsy  MEASURE: 4 x 3 x 2 mm  COMMENT: Single friable fragment of tan tissue  BLOCK#: B1, NTR     ([place])       Microscopic Description       Light microscopic examination is performed by Dr. Micheline Rough.       EMBEDDED IMAGES      Disclaimer       For cases in which immunostains have been performed, the following statement applies: Appropriate positive controls and negative controls (external and/or internal) have been evaluated. These immunostains have not been separately validated for use on decalcified specimens and should be interpreted with caution in that setting. Some of the immunohistochemical reagents used in this case may be classified as analyte specific reagents (ASR). ASRs have performance characteristics determined by the Anatomic Pathology Department, [facility], and have not been cleared or approved by the [place] Food and Drug Administration (FDA). The FDA has determined that such clearance or approval is not necessary. These tests are used for clinical purposes. They should not be regarded as investigational or for research. This laboratory is certified under the Clinical Laboratory Improvement Amendments of 1988 (CLIA-88) as qualified to perform high complexity clinical laboratory testing.       Outside labs:  07/28/18 Na 137 K 4.2 BUN 11 Cr 0.8 AST 33 ALT 47 TB 0.5 AP 60 alb 3.2 genotype 1a VL 1,739,096 iu/ml   02/2016 HAV IgG neg HBsAg neg    ASSESSMENT: Patient is a 57 y.o. WM with chronic HCV genotype 1a, treatment naive with stage 3 fibrosis on Fibroscan. He has a history of alcohol abuse, heavier in past, now with episodes on drinking about a 12 pack of beer in a day 2-3 times in a month. He has had no alcohol in past 3 weeks and is amenable to abstaining. He is concerned about his liver with stage 3 fibrosis. We discussed the natural history of HCV, risk factors for more aggressive fibrosis, transmission, and treatment. We discussed the oral therapies to treat HCV have a high cure rate and favorable side effect profile. He would like to proceed with therapy. With his high dose seroquel, [name] is a better choice X 12 weeks. We will need to be careful with how he takes his PPI therapy [name] with food and PPI 4 hrs later). He and his sister are also concerned that he has not had an EGD in 2 yrs and was having them q 6 months for surveillance of his h/o Barrett's with adenocarcinoma s/p EMR. Will go ahead and set that up.      PLAN:       -- labs and imaging reviewed  -- plan to treat HCV with Epclusa X 12 weeks  -- meet  with Park Breed PharmD today about HCV therapy  -- Fibroscan today shows stage 3 fibrosis  -- will order an ultrasound to screen for liver cancer with stage 3 fibrosis  -- Recommend no alcohol, especially since binge drinks when he does drink; patient agreeable since very interested in HCV therapy and off alcohol past 3 weeks; medication for alcohol craving discussed and patient declined; continue follow-up with psychiatry  -- Check HBsAb and HBcAb next visit; no immunity to HAV and likely needs TwinRix  -- Patient overdue for surveillance endoscopy for signet ring cell esophageal adenocarcinoma (diagnosed 05/28/12)- last EGD 09/2016 with negative biopsies; was followed by [facility]- will send for endoscopy with biopsies of distal esophagus and cardia  -- Patient to pick up Lisinopril 40mg  daily for blood pressure and Advair inhaler for COPD from his pharmacy as currently as none at home    Please feel free to call with questions.    Sashay Felling M. Piedad Climes, MD  [facility]

## 2018-10-01 NOTE — Unmapped (Signed)
Initial Counseling for HCV Treatment     Planned regimen: Epclusa (sofosbuvir/velpatasvir 400/100mg ) x 12 weeks  PA approved til 11/25/18. Will require TW#4 HCV RNA for continuation PA.  Planned start date: 10/07/18    Pharmacy: [facility] [phone] option #4    Past Medical History:   Diagnosis Date   ??? Affective psychosis (CMS-HCC) 04/27/2013 - 05/02/2013   ??? Alcohol withdrawal syndrome (CMS-HCC) 04/27/2013 - 05/02/2013   ??? Anxiety and depression    ??? Arthritis    ??? Asthma    ??? Bipolar 1 disorder (CMS-HCC)    ??? CVA (cerebral vascular accident) (CMS-HCC) 08/01/2013   ??? Esophageal carcinoma (CMS-HCC)     signet ring cell adenocarcinoma, poorly differentiated, diagnosed 05/2012   ??? ETOH abuse    ??? H/O blood clots    ??? Hepatitis C    ??? High cholesterol    ??? HTN (hypertension)    ??? Prostate cancer (CMS-HCC)     s/p seed implantation   ??? Recurrent major depression-severe (CMS-HCC)    ??? Substance abuse (CMS-HCC)     cocaine, marijuana   ??? TIA (transient ischemic attack) 02/03/2013 - 02/04/2013   ??? Tobacco abuse     1 ppd x41 years     Current medications: lisinopril, escitalopram, seroqeul, omeprazole    Patient is ready to start treatment with Epclusa.     Following topics were discussed during counseling:   Patient Counseling    Counseled the patient on the following:  doses and administration discussed, safe handling, storage, and disposal discussed, possible adverse effects and management discussed, possible drug and prescription drug interactions discussed, possible drug and OTC drug and food interactions discussed, lab monitoring and follow-up discussed, cost of medications and cost implications discussed, adherence and missed doses discussed, pharmacy contact information discussed          1. Indications for medication, dosage and administration.     A. Epclusa 400/100mg  1 tablet to take daily with food. (Must be with food if taking PPI) Patient plans to take in the afternoons around 5pm with food.     2. Common side effects of medications and management strategies. (fatigue, headache)      3. Importance of adherence to regimen, follow-up clinic visits and lab monitoring.   Medication Adherence    Demonstrates understanding of importance of adherence:  yes  Informant:  patient  Patient is at risk for Non-Adherence:  No  Reasons for non-adherence:  no problems identified  Confirmed plan for next specialty medication refill:  delivery by pharmacy       A. Asked patient to call Carren Rang at [phone]  to establish start date for treatment and to schedule appointment 4 weeks before starting treatment. Pt will need TW#4 HCV RNA submitted to Medicaid to get full treatment duration (Continuation PA) approved. Stressed importance of TW#4 visit to patient.      4. Drug-drug interaction.  Drug Interactions    Drug interactions evaluated:  yes  Clinically relevant drug interactions identified:  yes   Interactions list:  Omeprazole 20mg  - may decrease efficacy of Epclusa   Drug management plan:   We discussed the mechanism of drug-drug interaction with acid lowering agents such as omeprazole may decrease the absorption of Epclusa.  Instructed to take Epclusa first with food then 4 hours later pt may take omeprazole max dose of 20mg .         A. Current medications have been reviewed and assessed for  possible interaction.  Currently taking omeprazole. Patient trailed famotidine but reports it's not enough to control his heartburns. We discussed the mechanism of drug-drug interaction with acid lowering agents. Eplcusa should be administered with food and taken 4 hours before PPI (omeprazole 20mg  equivalent). Pt plans to take Epclusa around 5pm with food then take omeprazole 20mg  at 9pm. Advised to check with MD or pharmacist before taking any OTC/herbal medications, with emphasis regarding indigestion/heartburn medications.  Denies use of herbal medication such as milk thistle or St. John's wart. Allergies have been verified.      5. Importance of informing pharmacy and clinic of updated contact information. Advised patient to call pharmacy when down to about 7 day supply left to ensure there's no interruption in therapy.  Refill Coordination    Has the Patients' Contact Information Changed:  No  Is the Shipping Address Different:  No     Sent message to [facility] scheduling.  Shipping Information    Delivery Scheduled:  Yes  Delivery Date:  10/05/18  Medications to be Shipped:  Epclusa       Patient verbalized understanding. Provided contact information for any questions/concerns.     Park Breed, Pharm D., BCPS, BCGP, CPP  [facility]  [address]  [place], [place] [address]  [phone]    October 01, 2018 11:53 AM

## 2018-10-04 MED FILL — EPCLUSA 400 MG-100 MG TABLET: 28 days supply | Qty: 28 | Fill #0 | Status: AC

## 2018-10-05 NOTE — Unmapped (Signed)
Patient called to report he received Epclusa in the mail. He also reports that he had new medications prescribed: venlafaxine and hydroxyzine. He also states that he takes famotidine 10mg  1-2 tabs for heartburn.  We discussed the mechanism of drug-drug interaction with acid lowering agents such as famotidine as well as omeprazole which he reported taking before may decrease the absorption of Epclusa.  Pt would like to stop omeprazole and just use famotidine as famotidine works for him and so he can take all his meds at same time. He will discontinue omeprazole. He will start his treatment tomorrow, 10/06/18 at 7pm.

## 2018-10-26 NOTE — Unmapped (Signed)
Reason for call: Remind pt to call and schedule delivery of Epclusa    Hep C  Genotype:1a  (Ref Att 08/03/18 pg 5)  Treatment: Epclusa x 12 wks  Start date:10/24/18  Fibrosis:F3 (11.2 kPa) on 09/20/18    Called and spoke with pt at phone number [phone] to remind him to schedule refill of Epclusa. Pt stated he did not start tx as anticipated d/t being sick and taking cold medication. Pt stated he started tx 2 days ago and is taking his Epclusa at 7:00 PM. Pt called and scheduled his TW # 4 apt on 12/01/18 @ 0800 am with Owens Shark, DNP.     Vertell Limber RN, BSN  Nursing Care Coordinator   Pharmacy Adult GI Medicine  [facility]  [address]   [place], [place] [address]  [phone]

## 2018-11-05 ENCOUNTER — Encounter: Payer: Self-pay | Admitting: *Deleted

## 2018-11-05 ENCOUNTER — Other Ambulatory Visit: Payer: Self-pay

## 2018-11-05 ENCOUNTER — Emergency Department: Payer: Medicaid Other

## 2018-11-05 ENCOUNTER — Emergency Department
Admission: EM | Admit: 2018-11-05 | Discharge: 2018-11-05 | Disposition: A | Discharge status: Home / Self Care | Payer: Medicaid Other | Attending: Emergency Medicine | Admitting: Emergency Medicine

## 2018-11-05 DIAGNOSIS — R519 Headache, unspecified: Secondary | ICD-10-CM

## 2018-11-05 DIAGNOSIS — F17210 Nicotine dependence, cigarettes, uncomplicated: Secondary | ICD-10-CM | POA: Insufficient documentation

## 2018-11-05 DIAGNOSIS — Z8673 Personal history of transient ischemic attack (TIA), and cerebral infarction without residual deficits: Secondary | ICD-10-CM | POA: Insufficient documentation

## 2018-11-05 DIAGNOSIS — R079 Chest pain, unspecified: Secondary | ICD-10-CM | POA: Diagnosis present

## 2018-11-05 DIAGNOSIS — I10 Essential (primary) hypertension: Secondary | ICD-10-CM | POA: Diagnosis not present

## 2018-11-05 DIAGNOSIS — I2510 Atherosclerotic heart disease of native coronary artery without angina pectoris: Secondary | ICD-10-CM | POA: Insufficient documentation

## 2018-11-05 DIAGNOSIS — Z79899 Other long term (current) drug therapy: Secondary | ICD-10-CM | POA: Insufficient documentation

## 2018-11-05 DIAGNOSIS — R51 Headache: Secondary | ICD-10-CM | POA: Diagnosis not present

## 2018-11-05 LAB — BASIC METABOLIC PANEL
ANION GAP: 11 (ref 5–15)
BUN: 15 mg/dL (ref 6–20)
CHLORIDE: 101 mmol/L (ref 98–111)
CO2: 24 mmol/L (ref 22–32)
Calcium: 9.1 mg/dL (ref 8.9–10.3)
Creatinine, Ser: 0.71 mg/dL (ref 0.61–1.24)
GFR calc Af Amer: >60 mL/min (ref >60)
Glucose, Bld: 193 mg/dL — ABNORMAL HIGH (ref 70–99)
POTASSIUM: 4.2 mmol/L (ref 3.5–5.1)
Sodium: 136 mmol/L (ref 135–145)

## 2018-11-05 LAB — CBC
HEMATOCRIT: 52.4 % — AB (ref 39.0–52.0)
HEMOGLOBIN: 17.4 g/dL — AB (ref 13.0–17.0)
MCH: 29.6 pg (ref 26.0–34.0)
MCHC: 33.2 g/dL (ref 30.0–36.0)
MCV: 89.1 fL (ref 80.0–100.0)
Platelets: 160 10*3/uL (ref 150–400)
RBC: 5.88 MIL/uL — AB (ref 4.22–5.81)
RDW: 12.3 % (ref 11.5–15.5)
WBC: 10.9 10*3/uL — ABNORMAL HIGH (ref 4.0–10.5)
nRBC: 0 % (ref 0.0–0.2)

## 2018-11-05 LAB — TROPONIN I

## 2018-11-05 MED ORDER — KETOROLAC TROMETHAMINE 30 MG/ML IJ SOLN
30.0000 mg | Freq: Once | INTRAMUSCULAR | Status: AC
  Administered 2018-11-05: 30 mg via INTRAVENOUS
  Filled 2018-11-05: qty 1

## 2018-11-05 MED ORDER — ALBUTEROL SULFATE HFA 108 (90 BASE) MCG/ACT IN AERS: 2.0000 | INHALATION_SPRAY | Freq: Four times a day (QID) | RESPIRATORY_TRACT | 2 refills | Status: DC | PRN

## 2018-11-05 MED ORDER — SODIUM CHLORIDE 0.9 % IV SOLN
Freq: Once | INTRAVENOUS | Status: AC
  Administered 2018-11-05: 16:00:00 via INTRAVENOUS

## 2018-11-05 MED ORDER — BUSPIRONE HCL 10 MG PO TABS: 10.0000 mg | ORAL_TABLET | Freq: Three times a day (TID) | ORAL | 1 refills | Status: DC

## 2018-11-05 MED ORDER — IPRATROPIUM-ALBUTEROL 0.5-2.5 (3) MG/3ML IN SOLN
3.0000 mL | Freq: Once | RESPIRATORY_TRACT | Status: AC
  Administered 2018-11-05: 3 mL via RESPIRATORY_TRACT

## 2018-11-05 MED ORDER — IPRATROPIUM-ALBUTEROL 0.5-2.5 (3) MG/3ML IN SOLN
RESPIRATORY_TRACT | Status: AC
  Filled 2018-11-05: qty 3

## 2018-11-05 MED ORDER — CLONAZEPAM 0.5 MG PO TABS: 0.5000 mg | ORAL_TABLET | Freq: Every day | ORAL | 0 refills | Status: DC

## 2018-11-05 MED ORDER — ASPIRIN 81 MG PO TBEC: 81.0000 mg | DELAYED_RELEASE_TABLET | Freq: Every day | ORAL | 1 refills | Status: DC

## 2018-11-05 MED ORDER — ATORVASTATIN CALCIUM 40 MG PO TABS: 40.0000 mg | ORAL_TABLET | Freq: Every day | ORAL | 1 refills | Status: DC

## 2018-11-05 MED ORDER — METOCLOPRAMIDE HCL 5 MG/ML IJ SOLN
10.0000 mg | Freq: Once | INTRAMUSCULAR | Status: AC
  Administered 2018-11-05: 10 mg via INTRAVENOUS
  Filled 2018-11-05: qty 2

## 2018-11-05 MED ORDER — LISINOPRIL 20 MG PO TABS: 20.0000 mg | ORAL_TABLET | Freq: Every day | ORAL | 1 refills | Status: DC

## 2018-11-05 MED ORDER — LORAZEPAM 2 MG/ML IJ SOLN
1.0000 mg | Freq: Once | INTRAMUSCULAR | Status: AC
  Administered 2018-11-05: 1 mg via INTRAVENOUS
  Filled 2018-11-05: qty 1

## 2018-11-05 NOTE — ED Provider Notes (Signed)
[facility] Emergency Department Provider Note       Time seen: ----------------------------------------- 2:16 PM on 11/05/2018 -----------------------------------------   I have reviewed the triage vital signs and the nursing notes.  HISTORY   Chief Complaint Chest Pain and Headache    HPI Ian [name] is a 57 y.o. male with a history of anxiety, coronary disease, depression, GERD, hypertension, CVA who presents to the ED for chest pressure as well as headache today.  Patient does not have any neurologic complaints.  Patient reports pain worsened today when he was fixing his heater.  He denies fevers or chills, has had some cough and difficulty breathing.  He denies any flulike symptoms.  Past Medical History:  Diagnosis Date  . Anxiety   . Barrett esophagus   . Cancer ([place])   . Coronary artery disease   . Depression   . GERD (gastroesophageal reflux disease)   . Hypertension   . Pre-diabetes   . Stroke [facility]) 2014   "mini-stroke" per patient    Patient Active Problem List   Diagnosis Date Noted  . Leukocytosis 05/04/2018  . Severe recurrent major depression without psychotic features ([place]) 11/05/2017  . Dyslipidemia 01/07/2017  . Barrett's esophagus 01/07/2017  . Bipolar I disorder, most recent episode depressed with anxious distress ([place]) 01/06/2017  . Cannabis use disorder, moderate, dependence ([place]) 01/06/2017  . Tobacco use disorder 01/06/2017  . Alcohol use disorder, moderate, dependence ([place]) 10/06/2016  . Substance induced mood disorder ([place]) 10/06/2016  . Cocaine use disorder, moderate, dependence ([place]) 10/06/2016  . Depression 10/06/2016  . Acute left-sided weakness 05/03/2015  . CVA (cerebral infarction) 05/03/2015  . Weakness 11/16/2013  . Chest pain 11/16/2013  . HTN (hypertension) 11/16/2013  . Left-sided weakness 11/16/2013  . Torsades de pointes ([place]) 11/16/2013  . Hemiplegia, unspecified, affecting nondominant side  11/15/2013  . Speech and language deficits 11/15/2013    Past Surgical History:  Procedure Laterality Date  . ANGIOPLASTY    . APPENDECTOMY    . CARDIAC CATHETERIZATION    . CHOLECYSTECTOMY    . WRIST SURGERY Right    age 35    Allergies Fluoxetine; Hydrocodone-acetaminophen; Mirtazapine; Gabapentin; and Tramadol  Social History Social History   Tobacco Use  . Smoking status: Current Every Day Smoker    Packs/day: 1.00    Types: Cigarettes  . Smokeless tobacco: Never Used  Substance Use Topics  . Alcohol use: Yes    Comment: denies being a daily drinker but did have alcohol today  . Drug use: Yes    Types: Cocaine, Marijuana    Comment: used cocaine today; denies smoking marijuana for years   Review of Systems Constitutional: Negative for fever. Cardiovascular: Positive for chest pressure Respiratory: Positive for shortness of breath and cough Gastrointestinal: Negative for abdominal pain, vomiting and diarrhea. Musculoskeletal: Negative for back pain. Skin: Negative for rash. Neurological: Positive for headache  All systems negative/normal/unremarkable except as stated in the HPI  ____________________________________________   PHYSICAL EXAM:  VITAL SIGNS: ED Triage Vitals [11/05/18 1350]  Enc Vitals Group     BP      Pulse      Resp      Temp      Temp src      SpO2      Weight 279 lb 1.6 oz (126.6 kg)     Height      Head Circumference      Peak Flow      Pain Score  5     Pain Loc      Pain Edu?      Excl. in [place]?    Constitutional: Alert and oriented. Well appearing and in no distress. Eyes: Conjunctivae are normal. Normal extraocular movements. ENT   Head: Normocephalic and atraumatic.   Nose: No congestion/rhinnorhea.   Mouth/Throat: Mucous membranes are moist.   Neck: No stridor. Cardiovascular: Normal rate, regular rhythm. No murmurs, rubs, or gallops. Respiratory: Normal respiratory effort without tachypnea nor retractions.  Breath sounds are clear and equal bilaterally. No wheezes/rales/rhonchi. Gastrointestinal: Soft and nontender. Normal bowel sounds Musculoskeletal: Nontender with normal range of motion in extremities. No lower extremity tenderness nor edema. Neurologic:  Normal speech and language. No gross focal neurologic deficits are appreciated.  Skin:  Skin is warm, dry and intact. No rash noted. Psychiatric: Mood and affect are normal. Speech and behavior are normal.  ____________________________________________  EKG: Interpreted by me.  Sinus rhythm rate 99 bpm, normal PR interval, normal QRS, normal QT.  ____________________________________________  ED COURSE:  As part of my medical decision making, I reviewed the following data within the [place] History obtained from family if available, nursing notes, old chart and ekg, as well as notes from prior ED visits. Patient presented for hypertension and headache, we will assess with labs and imaging as indicated at this time.   Procedures ____________________________________________   LABS (pertinent positives/negatives)  Labs Reviewed  BASIC METABOLIC PANEL - Abnormal; Notable for the following components:      Result Value   Glucose, Bld 193 (*)    All other components within normal limits  CBC - Abnormal; Notable for the following components:   WBC 10.9 (*)    RBC 5.88 (*)    Hemoglobin 17.4 (*)    HCT 52.4 (*)    All other components within normal limits  TROPONIN I    RADIOLOGY Images were viewed by me  Chest x-ray IMPRESSION: No active cardiopulmonary disease. Aortic atherosclerosis. ____________________________________________  DIFFERENTIAL DIAGNOSIS   URI, bronchospasm, COPD, hypertension, renal failure  FINAL ASSESSMENT AND PLAN  Hypertension, headache   Plan: The patient had presented for hypertension and headache which has resolved after fluids, Reglan and Toradol. Patient's labs did not  reveal any acute process. Patient's imaging also negative.  I will restart his blood pressure medicines and overall he appears to feel better and is cleared for outpatient follow-up.   Laurence Aly, MD   Note: This note was generated in part or whole with voice recognition software. Voice recognition is usually quite accurate but there are transcription errors that can and very often do occur. I apologize for any typographical errors that were not detected and corrected.     Earleen Newport, MD 11/05/18 [phone]

## 2018-11-05 NOTE — ED Notes (Signed)
Gave some gingerale.

## 2018-11-05 NOTE — ED Notes (Signed)
FIRST NURSE NOTE: Pt reports elevated blood pressure at his doctor's office.

## 2018-11-05 NOTE — ED Triage Notes (Signed)
Pt reports having been out of his BP medications for over a week. Chest pressure has been worsening over the past week. Headache today with no neuro deficits. Pain worsened today when pt was fixing his heater. Pt able to talk in complete sentences. And in NAD at this time.

## 2018-11-16 NOTE — Unmapped (Addendum)
Follow-Up Counseling for HCV Treatment      Regimen: Epclusa (sofosbuvir/velpatasvir 400/100mg ) x 12 weeks  Start Date: 10/24/18  Completed Treatment Week #3    Pharmacy: [facility] [phone]     Following topics were reviewed during the phone call:  Patient Counseling    Counseled the patient on the following:  doses and administration discussed, possible adverse effects and management discussed, possible drug and prescription drug interactions discussed, possible drug and OTC drug and food interactions discussed, lab monitoring and follow-up discussed, therapeutic rationale discussed, adherence and missed doses discussed, pharmacy contact information discussed         1. Medication administration - Takes Epclusa every night at 7:00 PM.     2. Importance of adherence -   Medication Adherence    Patient reported X missed doses in the last month:  0  Specialty Medication:  Epclusa  Patient is on additional specialty medications:  No  Patient is on more than two specialty medications:  No  Any gaps in refill history greater than 2 weeks in the last 3 months:  no  Demonstrates understanding of importance of adherence:  yes  Informant:  patient  Reliability of informant:  reliable  Provider-estimated medication adherence level:  90-100%  Patient is at risk for Non-Adherence:  No  Reasons for non-adherence:  no problems identified      Adherence tools used:  alarm, cell phone          Confirmed plan for next specialty medication refill:  delivery by pharmacy  Refills needed for supportive medications:  not needed       Pt was at work and unable to provide pill count. Pt reports he has not missed any doses.  Advised patient to call pharmacy when down to about 7 day supply left to ensure there's no interruption in therapy.    3. Side effects - Pt reports having a little diarrhea. Encouraged pt to eat some bland foods ex. Crackers, toast, pretzels, baked chicken breast, oatmeal, plain rice, or eating yogurt may help.  Pt states he is drinking plenty of water.   Adverse Effects        Diarrhea:  Pos         4. Drug-drug interaction -Pt reports going to the ER because he ran out of his BP medication and it was quite elevated. Pt stated he has been stressed and having anxiety d/t the holidays and being alone. The ER refilled his BP medication and he has been taking it daily. They also put him on Klonopin 0.5 mg daily as needed. No interactions noted on Lexi Comp. Not found on Liver Pool interaction checker.  Pt stated the klonopin has really helped his anxiety. Pt denies any alcohol.  Pt reports he quit using the Omeprazole 20 mg and is taking famotidine 10mg  1-2 tabs for heartburn. Pt reports taking at the same time as his other medications. Pt reports it was too difficult to remember to take the Omeprazole 4 hrs later than the Epclusa so he just is taking Famotidine 10 mg (1-2 tablets).   Drug Interactions    Drug interactions evaluated:  yes  Clinically relevant drug interactions identified:  yes   Interactions list:  Famotidine 10 mg (1-2 tablets)- may decrease efficacy of Epclusa   Drug management plan:  We discussed the mechanism of drug-drug interaction with acid lowering agents such as Famotidine may decrease the absorption of Epclusa.  Instructed to take [place]  and Famotidine 10 mg (1-2 tablets) at the same. Pt advised to discontinue omeprazole 20 mg and use Famotidine instead as pt thought this would be easier for him to adhere to.               5. Follow up - Has follow up appointment scheduled in HCV treatment clinic on 12/01/18 @ 0800 am with Owens Shark, DNP.  Any barriers to coming to appointment? no   Confirmed pt's address is correct on Epic: [address] [place] [address]. Notified pt I will request for delivery date of 11/17/18 and we will give him a call if we are unable to get it to him tomorrow.   Shipping Information    Delivery Scheduled:  Yes  Delivery Date:  11/17/18 Medications to be Shipped:  Epclusa       Refill Coordination    Has the Patients' Contact Information Changed:  No  Is the Shipping Address Different:  No       All questions were answered.    Vertell Limber RN, [facility]   Pharmacy Adult GI Medicine  [facility]  [address]   [place], [place] [address]  [phone]    November 16, 2018 10:31 AM

## 2018-11-17 MED FILL — EPCLUSA 400 MG-100 MG TABLET: ORAL | 28 days supply | Qty: 28 | Fill #1

## 2018-11-17 MED FILL — EPCLUSA 400 MG-100 MG TABLET: 28 days supply | Qty: 28 | Fill #1 | Status: AC

## 2018-11-24 IMAGING — CR DG LUMBAR SPINE 2-3V
1 series · 3 of 3 positions shown · non-contrast
Comparison: None.

CLINICAL DATA: Low back pain radiating into the right hip for 2
months, no known injury, initial encounter

EXAM:
LUMBAR SPINE - 3 VIEW

[Series 1: dg lumbar spine 2-3 views · 0.14mm/px · 3 of 3 slices shown]
[im 1/3]
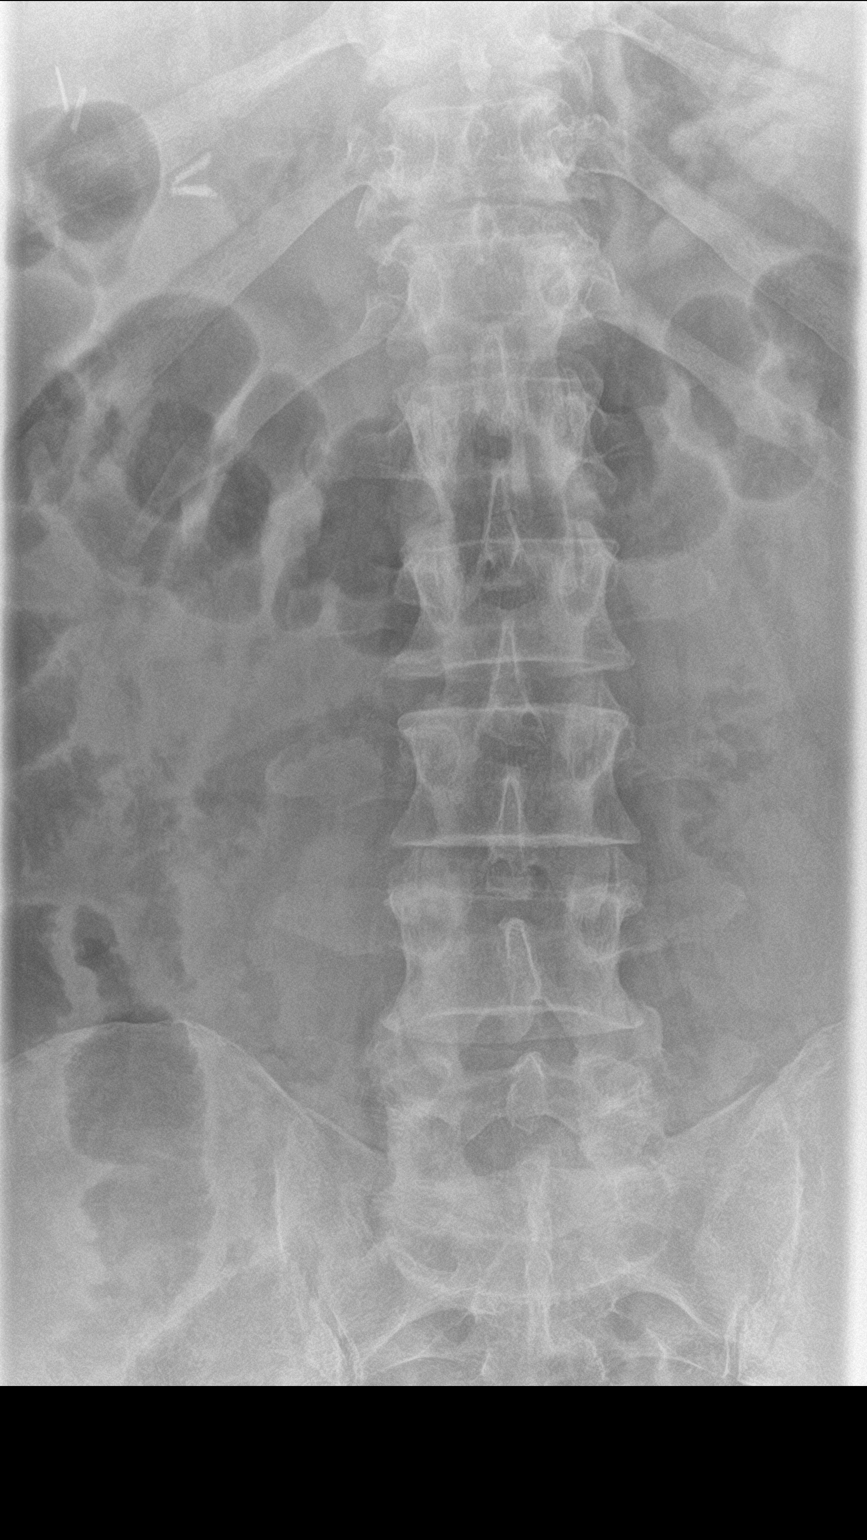
[im 2/3]
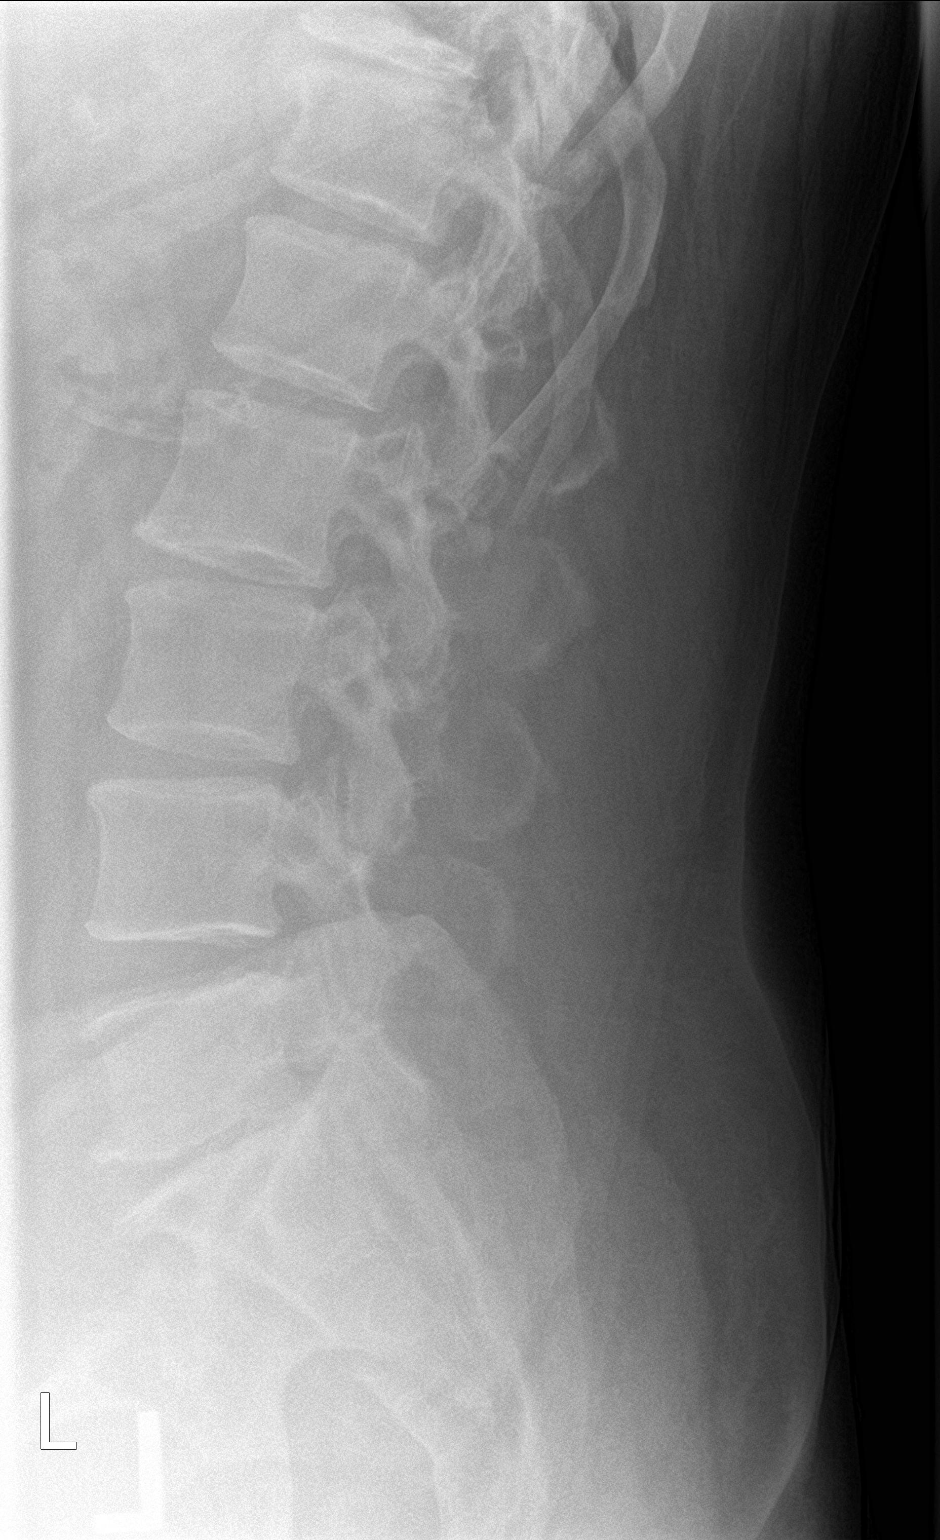
[im 3/3]
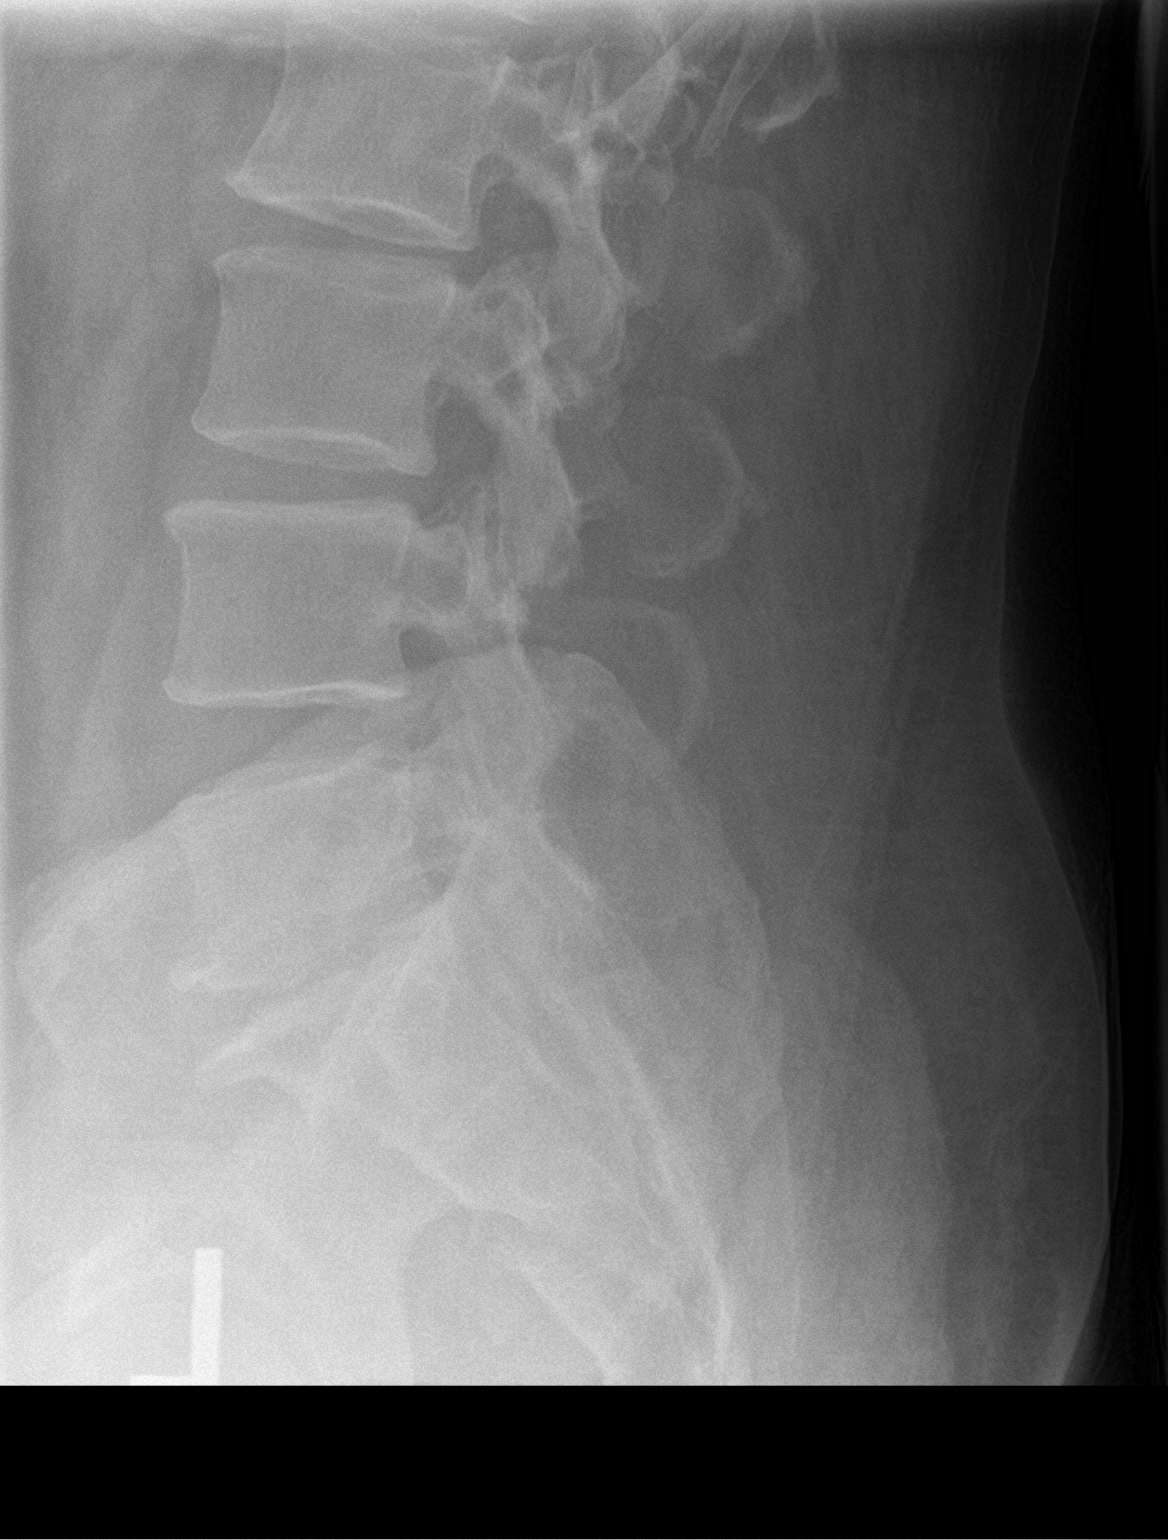

[3 of 3 positions shown; findings below may reference images not displayed]

FINDINGS: Five lumbar type vertebral bodies are well visualized. Vertebral
body height is well maintained. Disc space narrowing with
osteophytic changes are seen at L5-S1. No anterolisthesis is noted.
No other focal abnormality is seen.
IMPRESSION: Mild degenerative changes at L5-S1. No other focal abnormality is
noted.

## 2018-11-26 NOTE — Unmapped (Signed)
Reason for call: Reminder call for ultrasound scheduled on 11/29/18 @ 0945 at [facility]    Hep C  Genotype:1a????(Ref Att 08/03/18 pg 5)  Treatment: Epclusa x 12 wks  Start date:10/24/18  Fibrosis:F3 (11.2 kPa) on 09/20/18  TW # 4    Reminder call placed to pt at phone number [phone] for ultrasound scheduled @ [facility] on 11/29/18 @ 0945. Pt instructed to be NPO for 6 hrs prior to ultrasound. Pt verbalized understanding and thanked me for the call.     Vertell Limber RN, BSN  Nursing Care Coordinator   Pharmacy Adult GI Medicine  [facility]  [address]   [place], [place] [address]  [phone]

## 2018-11-29 ENCOUNTER — Ambulatory Visit: Admit: 2018-11-29 | Discharge: 2018-11-29 | Payer: MEDICAID

## 2018-11-29 DIAGNOSIS — K740 Hepatic fibrosis: Secondary | ICD-10-CM

## 2018-11-29 DIAGNOSIS — B1920 Unspecified viral hepatitis C without hepatic coma: Principal | ICD-10-CM

## 2018-11-30 NOTE — Unmapped (Signed)
Reason for call: Reminder call for apt scheduled on 12/01/18 @ 0800 am    Hep C  Genotype:1a????(Ref Att 08/03/18 pg 5)  Treatment:??Epclusa x 12 wks  Start date:10/24/18  Fibrosis:F3 (11.2 kPa) on 09/20/18  TW # 5    Reminder call placed to pt at phone number [phone] for apt scheduled on 12/01/18 @ 0800 am with Owens Shark, DNP. Stressed the importance of the appointment. Pt stated his friend is picking him up first thing in the morning, and he will be there.     Vertell Limber RN, BSN  Nursing Care Coordinator   Pharmacy Adult GI Medicine  [facility]  [address]   [place], [place] [address]  [phone]

## 2018-12-01 ENCOUNTER — Ambulatory Visit: Admit: 2018-12-01 | Discharge: 2018-12-02 | Payer: MEDICAID | Attending: Family | Primary: Family

## 2018-12-01 ENCOUNTER — Encounter: Admit: 2018-12-01 | Discharge: 2018-12-02 | Payer: MEDICAID | Attending: Family | Primary: Family

## 2018-12-01 DIAGNOSIS — B182 Chronic viral hepatitis C: Principal | ICD-10-CM

## 2018-12-01 DIAGNOSIS — Z114 Encounter for screening for human immunodeficiency virus [HIV]: Secondary | ICD-10-CM

## 2018-12-01 DIAGNOSIS — K740 Hepatic fibrosis: Secondary | ICD-10-CM

## 2018-12-01 DIAGNOSIS — E669 Obesity, unspecified: Secondary | ICD-10-CM

## 2018-12-01 DIAGNOSIS — Z7289 Other problems related to lifestyle: Secondary | ICD-10-CM

## 2018-12-01 LAB — CBC W/ AUTO DIFF
BASOPHILS ABSOLUTE COUNT: 0.1 10*9/L (ref 0.0–0.1)
BASOPHILS RELATIVE PERCENT: 0.5 %
EOSINOPHILS ABSOLUTE COUNT: 0.5 10*9/L — ABNORMAL HIGH (ref 0.0–0.4)
EOSINOPHILS RELATIVE PERCENT: 5 %
HEMATOCRIT: 53.1 % — ABNORMAL HIGH (ref 41.0–53.0)
HEMOGLOBIN: 16.9 g/dL (ref 13.5–17.5)
LARGE UNSTAINED CELLS: 2 % (ref 0–4)
LYMPHOCYTES ABSOLUTE COUNT: 3.4 10*9/L (ref 1.5–5.0)
MEAN CORPUSCULAR HEMOGLOBIN: 29.8 pg (ref 26.0–34.0)
MEAN CORPUSCULAR VOLUME: 93.6 fL (ref 80.0–100.0)
MEAN PLATELET VOLUME: 9.7 fL (ref 7.0–10.0)
MONOCYTES RELATIVE PERCENT: 6 %
NEUTROPHILS ABSOLUTE COUNT: 5.3 10*9/L (ref 2.0–7.5)
NEUTROPHILS RELATIVE PERCENT: 52.5 %
PLATELET COUNT: 192 10*9/L (ref 150–440)
RED BLOOD CELL COUNT: 5.67 10*12/L (ref 4.50–5.90)
RED CELL DISTRIBUTION WIDTH: 13.8 % (ref 12.0–15.0)
WBC ADJUSTED: 10.1 10*9/L (ref 4.5–11.0)

## 2018-12-01 LAB — HEPATIC FUNCTION PANEL
ALKALINE PHOSPHATASE: 59 U/L (ref 38–126)
ALT (SGPT): 28 U/L (ref <50)
AST (SGOT): 24 U/L (ref 19–55)
PROTEIN TOTAL: 7.5 g/dL (ref 6.5–8.3)

## 2018-12-01 LAB — BASIC METABOLIC PANEL
ANION GAP: 8 mmol/L (ref 7–15)
BLOOD UREA NITROGEN: 12 mg/dL (ref 7–21)
BUN / CREAT RATIO: 15
CHLORIDE: 103 mmol/L (ref 98–107)
CREATININE: 0.82 mg/dL (ref 0.70–1.30)
EGFR CKD-EPI AA MALE: >90 mL/min/{1.73_m2} (ref >=60)
EGFR CKD-EPI NON-AA MALE: >90 mL/min/{1.73_m2} (ref >=60)
GLUCOSE RANDOM: 114 mg/dL (ref 65–179)
POTASSIUM: 5 mmol/L (ref 3.5–5.0)
SODIUM: 138 mmol/L (ref 135–145)

## 2018-12-01 LAB — HIV ANTIGEN/ANTIBODY COMBO: HIV 1+2 Ab+HIV1 p24 Ag:PrThr:Pt:Ser/Plas:Ord:IA: NONREACTIVE

## 2018-12-01 LAB — ALT (SGPT): Alanine aminotransferase:CCnc:Pt:Ser/Plas:Qn:: 28

## 2018-12-01 LAB — HEPATITIS B SURFACE ANTIBODY
HEPATITIS B SURFACE ANTIBODY QUANT: <8 m[IU]/mL (ref <8.00)
Hepatitis B virus surface Ab:PrThr:Pt:Ser:Ord:: NONREACTIVE

## 2018-12-01 LAB — HEPATITIS B CORE TOTAL ANTIBODY: Hepatitis B virus core Ab:PrThr:Pt:Ser/Plas:Ord:IA: REACTIVE — AB

## 2018-12-01 LAB — SODIUM: Sodium:SCnc:Pt:Ser/Plas:Qn:: 138

## 2018-12-01 LAB — HEPATITIS B SURFACE ANTIGEN: Hepatitis B virus surface Ag:PrThr:Pt:Ser:Ord:: NONREACTIVE

## 2018-12-01 LAB — MEAN CORPUSCULAR VOLUME: Lab: 93.6

## 2018-12-01 LAB — HEPATITIS A IGG: Hepatitis A virus Ab.IgG:PrThr:Pt:Ser:Ord:: NONREACTIVE

## 2018-12-01 NOTE — Unmapped (Signed)
[facility] Ph [phone] Fax [phone]        Referring Provider:  [facility]  [address]  [place], [place] [address]     Primary Care Provider:  [facility]    Reason For Office Visit: Treatment follow up HCV with advanced fibrosis. Genotype 1A, treatment naive  Epclusa x 12 weeks  10/24/2018  TW #6       HISTORY OF PRESENT ILLNESS:   Douglas Schultz is a very pleasant 57 yo Caucasian gentleman who presents today for treatment follow up ~ TW #6. He advises he has adhered to taking one tablet of Epclusa daily at 7 PM with all of his other medications. Further discussion reveals that he is has been out of his Lisinopril for one week. Evident of BP being very poorly controlled today. He has follow up visit with his PCP this afternoon. Very poor dietary intake. Most of his meals are meals which can been cooked in microwave. He does not have an stove, but has a Haematologist. He resides by himself. He relies of Peer Support to bring him to his appointments. He gets four hours of transportation service weekly.     He presents alone today during interviewing process. Denies having missed any doses of Epclusa. Three weeks ago he did consume one 40 ounce beer. I was depressed and said pisse with everything and drank it. Taking Pepcid with DAA dose (same time). Potential adverse event of looser stools. Past 24 hours experiencing headache with chest tightness. He feels r/t to him being off Lisinopril or anxiety. He does not feel his anxiety is well controlled. Wishes to be placed back on Clonazepam. Will address this concern with his psychiatrist.     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 The patient has undergone multiple EGDs for treatment of his adenocarcinoma, most recently on 10/17 where he underwent random biopsies of his Barrett's as well as EMR of a nodularity seen at the [facility]. He was being scoped and biopsied every 6 months but has fallen out of care with departure of Dr. Baldo Daub. From his visit on 09/20/2018, he has been scheduled for an EGD on 12/21/2018. Possible need to rescheduled due to transportation issue. His sister suppose to bring him but found area in her breast now.    Overall, he has been without liver-related complaints. Patient denies any jaundice, pruritis, CP, LE edema, melena, BRBPR, confusion, nausea, vomiting, or diarrhea. Noted increase abdominal girth size but feel related to weight gain not fluid retention. Ten pound weight gain since last clinic visit. Immobile life style. Advises he is not able to walk due to his lower back pain.     He reports that most of his high risk behavior for HCV was in the 1990's. He denies IVDU but endorses other street drug use. He does have a history of alcohol abuse. He had DUI's in 1980's and went to prison 3X. He does not drink daily but will drink 3-4 40 oz beers when he does drink. No alcohol in past 3 weeks. Denies withdrawal but may have had an alcohol related seizure in the past vs sequelae of his prior CVA. Binge drinker when off psych meds. He is under the care of psychiatry (meds being adjusted currently) and has noted  that his weight has been going up steadily over past year on seroquel 300mg . He c/o dysphagia +/- regurgitation every 1-2 weeks. He has SOB with exertion and continues smoking daily (one pack/day). He feels like long acting seroquel makes his restless leg syndrome worse. He also c/o right arm tremor- worse since May- evaluated with brain MRI and CT of neck vessels as well as Echo.    Liver Care:  HCV genotype 1a, VL 1,739,096 iu/ml, naive  HCV RNA TW #6 - 76 IU/mL    Immunity hepatitis B: + core total Ab  Immunity hepatitis A: non reactive   HIV: non reactive  Virology studies ordered, but prior evidence of no immunity towards hepatitis A, thus #1 Twinrix administered today as suspect he probably does not have immunity towards hepatitis B.    Fibroscan: 11.2 kPa (F3)  HCC screen: CTA 06/2017 neg  Abdominal U/S 11/29/2018 - Hyperechoic enlarged liver compatible with chronic liver disease.  Spleen normal. No fluid.   ??  Fibrosis RFs: alcohol, obesity, insulin resistance    REVIEW OF SYSTEMS:   All systems reviewed and negative except as noted above and fact depression and anxiety are overall poorly controlled.     PAST MEDICAL HISTORY:    Past Medical History:   Diagnosis Date   ??? Affective psychosis (CMS-HCC) 04/27/2013 - 05/02/2013   ??? Alcohol withdrawal syndrome (CMS-HCC) 04/27/2013 - 05/02/2013   ??? Anxiety and depression    ??? Arthritis    ??? Asthma    ??? Bipolar 1 disorder (CMS-HCC)    ??? CVA (cerebral vascular accident) (CMS-HCC) 08/01/2013   ??? Esophageal carcinoma (CMS-HCC)     signet ring cell adenocarcinoma, poorly differentiated, diagnosed 05/2012   ??? ETOH abuse    ??? H/O blood clots    ??? Hepatitis C    ??? High cholesterol    ??? HTN (hypertension)    ??? Prostate cancer (CMS-HCC)     s/p seed implantation   ??? Recurrent major depression-severe (CMS-HCC)    ??? Substance abuse (CMS-HCC)     cocaine, marijuana   ??? TIA (transient ischemic attack) 02/03/2013 - 02/04/2013   ??? Tobacco abuse     1 ppd x41 years       PAST SURGICAL HISTORY:    Past Surgical History:   Procedure Laterality Date   ??? ABDOMINAL SURGERY     ??? APPENDECTOMY     ??? CHOLECYSTECTOMY     ??? HAND RECONSTRUCTION Right    ??? PR EDG TRANSORAL ENDOSCOPIC MUCOSAL RESECTION  08/26/2013    Procedure: ESOPHAGOGASTRODUODENOSCOPY, FLEXIBLE, TRANSORAL; WITH ENDOSCOPIC MUCOSAL RESECTION;  Surgeon: Clint Bolder, MD;  [facility];  Service: Gastroenterology   ??? PR EDG TRANSORAL ENDOSCOPIC MUCOSAL RESECTION  12/30/2013    Procedure: ESOPHAGOGASTRODUODENOSCOPY, FLEXIBLE, TRANSORAL; WITH ENDOSCOPIC MUCOSAL RESECTION;  Surgeon: Erskin Burnet, MD;  [facility];  Service: Gastroenterology   ??? PR ENDOSCOPIC ULTRASOUND EXAM N/A 08/26/2013    Procedure: UGI ENDO; W/ENDO ULTRASOUND EXAM INCLUDES ESOPHAGUS, STOMACH, &/OR DUODENUM/JEJUNUM;  Surgeon: Clint Bolder, MD;  [facility];  Service: Gastroenterology   ??? PR UPPER GI ENDOSCOPY,BIOPSY N/A 04/11/2014    Procedure: UGI ENDOSCOPY; WITH BIOPSY, SINGLE OR MULTIPLE;  Surgeon: Erskin Burnet, MD;  Location: [facility];  Service: Gastroenterology   ??? PR UPPER GI ENDOSCOPY,BIOPSY N/A 07/21/2014    Procedure: UGI ENDOSCOPY; WITH BIOPSY, SINGLE OR MULTIPLE;  Surgeon: Erskin Burnet, MD;  [facility];  Service: Gastroenterology   ??? PR UPPER GI ENDOSCOPY,BIOPSY N/A 02/19/2015    Procedure: UGI ENDOSCOPY; WITH BIOPSY, SINGLE OR MULTIPLE;  Surgeon: Erskin Burnet, MD;  [facility]: [facility];  Service: Gastroenterology   ??? PR UPPER GI ENDOSCOPY,BIOPSY N/A 05/25/2015    Procedure: UGI ENDOSCOPY; WITH BIOPSY, SINGLE OR MULTIPLE;  Surgeon: Erskin Burnet, MD;  [facility];  Service: Gastroenterology   ??? PR UPPER GI ENDOSCOPY,BIOPSY Left 09/07/2015    Procedure: UGI ENDOSCOPY; WITH BIOPSY, SINGLE OR MULTIPLE;  Surgeon: Erskin Burnet, MD;  [facility];  Service: Gastroenterology   ??? PR UPPER GI ENDOSCOPY,BIOPSY N/A 12/26/2015    Procedure: UGI ENDOSCOPY; WITH BIOPSY, SINGLE OR MULTIPLE;  Surgeon: Lanier Ensign, MD;  [facility]: [facility];  Service: Gastroenterology   ??? PR UPPER GI ENDOSCOPY,BIOPSY N/A 04/07/2016    Procedure: UGI ENDOSCOPY; WITH BIOPSY, SINGLE OR MULTIPLE;  Surgeon: Lanier Ensign, MD;  [facility] [place];  Service: Gastroenterology   ??? PR UPPER GI ENDOSCOPY,BIOPSY N/A 10/13/2016    Procedure: UGI ENDOSCOPY; WITH BIOPSY, SINGLE OR MULTIPLE;  Surgeon: Lanier Ensign, MD;  [facility];  Service: Gastroenterology   ??? PR UPPER GI ENDOSCOPY,DIAGNOSIS N/A 10/07/2013    Procedure: UGI ENDO, INCLUDE ESOPHAGUS, STOMACH, & DUODENUM &/OR JEJUNUM; DX W/WO COLLECTION SPECIMN, BY BRUSH OR WASH;  Surgeon: Erskin Burnet, MD;  [facility];  Service: Gastroenterology   ??? PR UPPER GI ENDOSCOPY,DIAGNOSIS N/A 10/13/2013    Procedure: UGI ENDO, INCLUDE ESOPHAGUS, STOMACH, & DUODENUM &/OR JEJUNUM; DX W/WO COLLECTION SPECIMN, BY BRUSH OR WASH;  Surgeon: Tish Men, MD;  [facility];  Service: Gastroenterology     MEDICATIONS:  Current Outpatient Medications   Medication Sig Dispense Refill   ??? ADVAIR DISKUS 250-50 mcg/dose diskus INHALE 1 INHALATION INTO THE LUNGS EVERY 12 (TWELVE) HOURS  12   ??? atorvastatin (LIPITOR) 40 MG tablet Take 40 mg by mouth.     ??? clonazePAM (KLONOPIN) 1 MG tablet Take 1 mg by mouth TID.     ??? EPCLUSA 400-100 mg tablet Take 1 tablet by mouth daily. 28 tablet 2   ??? escitalopram oxalate (LEXAPRO) 20 MG tablet Take 20 mg by mouth daily.     ??? famotidine (PEPCID) 20 MG tablet Take 20 mg by mouth Two (2) times a day.     ??? hydrOXYzine (ATARAX) 25 MG tablet Take 25 mg by mouth Three (3) times a day.     ??? ibuprofen (ADVIL,MOTRIN) 200 MG tablet Take 400 mg by mouth every six (6) hours as needed for pain.     ??? lisinopril (PRINIVIL,ZESTRIL) 10 MG tablet Take 10 mg by mouth daily.     ??? QUEtiapine (SEROQUEL XR) 400 MG 24 hr tablet TAKE 1 TABLET BY MOUTH AT 6 PM  1   ??? omeprazole (PRILOSEC) 20 MG capsule Take 20 mg by mouth daily.     ??? PNV w/o calcium-iron fum-FA (M-VIT) 27-1 mg Tab Take 1 tablet by mouth daily.     ??? QUEtiapine (SEROQUEL) 300 MG tablet Take 300 mg by mouth nightly.       No current facility-administered medications for this visit.        ALLERGIES:    Mirtazapine; Prozac [fluoxetine]; Vicodin [hydrocodone-acetaminophen]; Tramadol; and Gabapentin    SOCIAL HISTORY: Social History     Socioeconomic  History   ??? Marital status: Single     Spouse name: None   ??? Number of children: 0   ??? Years of education: None   ??? Highest education level: None   Occupational History     Employer: NOT EMPLOYED   Social Needs   ??? Financial resource strain: None   ??? Food insecurity:     Worry: None     Inability: None   ??? Transportation needs:     Medical: None     Non-medical: None   Tobacco Use   ??? Smoking status: Current Every Day Smoker     Packs/day: 1.00     Years: 40.00     Pack years: 40.00     Types: Cigarettes   ??? Smokeless tobacco: Never Used   Substance and Sexual Activity   ??? Alcohol use: Yes     Alcohol/week: 12.0 standard drinks     Types: 12 Cans of beer per week     Frequency: Monthly or less     Drinks per session: 1 or 2     Comment: drank 15-20 beers wednes jun1   ??? Drug use: Not Currently     Types: Cocaine, Marijuana     Comment: April 8 or 9, 2017   ??? Sexual activity: None   Lifestyle   ??? Physical activity:     Days per week: None     Minutes per session: None   ??? Stress: None   Relationships   ??? Social connections:     Talks on phone: None     Gets together: None     Attends religious service: None     Active member of club or organization: None     Attends meetings of clubs or organizations: None     Relationship status: None   Other Topics Concern   ??? None   Social History Narrative   ??? None     Lives alone in [place], [place]. Does lawn care occasionally. Smokes 1ppd X 39yrs. Sister lives in [place], [place]. Alcohol as above. Last cocaine use ~ 1 year ago. MJ use - last use years ago.     FAMILY HISTORY:    family history includes Breast cancer in his mother and sister; Cancer in his father, maternal aunt, mother, and sister.  No liver disease.      VITAL SIGNS:    BP 182/141 Comment: pt states he has 'white coat syndrome' - Pulse 87  - Temp 36.5 ??C (97.7 ??F) (Oral)  - Resp 20  - Ht 182.9 cm (6' 0.01)  - Wt (!) 129.1 kg (284 lb 11.2 oz)  - SpO2 95%  - BMI 38.60 kg/m??   Body mass index is 38.6 kg/m??.    PHYSICAL EXAM:  General appearance - Alert, well appearing, and in no distress, oriented to person, place, and time and overweight  Mental status - depressed mood, flat affect.   Eyes - pupils equal and reactive, extraocular eye movements intact, sclera anicteric  Neurological - screening mental status exam normal  Rest of PE deferred.        DIAGNOSTIC STUDIES:  I have reviewed all pertinent diagnostic studies, including:    GI Procedures:  EGD 09/2016 Grade B reflux esophagitis, no Barrett's esophagus  Fibroscan 09/20/18 11.2 kPa (F3)    Radiographic studies:  CTA 07/12/17 C/A/P   Hepatobiliary: No focal liver abnormality is seen. Status post cholecystectomy. No biliary dilatation.    Laboratory results:  Ref. Range 12/01/2018 09:04   WBC Latest Ref Range: 4.5 - 11.0 10*9/L 10.1   RBC Latest Ref Range: 4.50 - 5.90 10*12/L 5.67   HGB Latest Ref Range: 13.5 - 17.5 g/dL 02.7   HCT Latest Ref Range: 41.0 - 53.0 % 53.1 (H)   MCV Latest Ref Range: 80.0 - 100.0 fL 93.6   MCH Latest Ref Range: 26.0 - 34.0 pg 29.8   MCHC Latest Ref Range: 31.0 - 37.0 g/dL 25.3   RDW Latest Ref Range: 12.0 - 15.0 % 13.8   MPV Latest Ref Range: 7.0 - 10.0 fL 9.7   Platelet Latest Ref Range: 150 - 440 10*9/L 192   Neutrophils % Latest Units: % 52.5   Lymphocytes % Latest Units: % 34.2   Monocytes % Latest Units: % 6.0   Eosinophils % Latest Units: % 5.0   Basophils % Latest Units: % 0.5   Absolute Neutrophils Latest Ref Range: 2.0 - 7.5 10*9/L 5.3   Absolute Lymphocytes Latest Ref Range: 1.5 - 5.0 10*9/L 3.4   Absolute Monocytes  Latest Ref Range: 0.2 - 0.8 10*9/L 0.6   Absolute Eosinophils Latest Ref Range: 0.0 - 0.4 10*9/L 0.5 (H)   Absolute Basophils  Latest Ref Range: 0.0 - 0.1 10*9/L 0.1   Large Unstained Cells Latest Ref Range: 0 - 4 % 2   Sodium Latest Ref Range: 135 - 145 mmol/L 138   Potassium Latest Ref Range: 3.5 - 5.0 mmol/L 5.0   Chloride Latest Ref Range: 98 - 107 mmol/L 103   CO2 Latest Ref Range: 22.0 - 30.0 mmol/L 27.0   Bun Latest Ref Range: 7 - 21 mg/dL 12   Creatinine Latest Ref Range: 0.70 - 1.30 mg/dL 6.64   BUN/Creatinine Ratio Unknown 15   EGFR CKD-EPI African American, Male Latest Ref Range: >=60 mL/min/1.46m2 >90   EGFR CKD-EPI Non-African American, Male Latest Ref Range: >=60 mL/min/1.76m2 >90   Anion Gap Latest Ref Range: 7 - 15 mmol/L 8   Glucose Latest Ref Range: 65 - 179 mg/dL 403   Calcium Latest Ref Range: 8.5 - 10.2 mg/dL 9.7   Albumin Latest Ref Range: 3.5 - 5.0 g/dL 4.2   Total Protein Latest Ref Range: 6.5 - 8.3 g/dL 7.5   Total Bilirubin Latest Ref Range: 0.0 - 1.2 mg/dL 0.4   Bilirubin, Direct Latest Ref Range: 0.00 - 0.40 mg/dL <4.74   AST Latest Ref Range: 19 - 55 U/L 24   ALT Latest Ref Range: <50 U/L 28   Alkaline Phosphatase Latest Ref Range: 38 - 126 U/L 59     HCV RNA level - 76 IU/ML    Outside labs:  07/28/18 Na 137 K 4.2 BUN 11 Cr 0.8 AST 33 ALT 47 TB 0.5 AP 60 alb 3.2 genotype 1a VL 1,739,096 iu/ml   02/2016 HAV IgG neg HBsAg neg    ASSESSMENT/PLAN:   1. Chronic hepatitis C, treatment naive with advanced fibrosis. Increase risk for fibrosis based on his past medical history and social history (alcohol use):   Douglas Schultz is a 57 yo WM with chronic HCV genotype 1a, treatment naive with stage 3 fibrosis on Fibroscan. He has a history of alcohol abuse, heavier in past, now with episodes on drinking about a 12 pack of beer in a day 2-3 times in a month. He presents today for treatment follow up ~ TW #6.  He has adhered to taking one tablet of Epclusa daily at 7 PM.   He consumed one  40 ounce beer three weeks ago. Only potential adverse event his has experienced is loose stools.   He is currently taking Pepcid AC at the same time of Epclusa dose. He has stopped PPI therapy while on treatment.     ~ HCV safety labs ordered.   ~ Continue Epclusa one tablet daily.   ~ Office follow up seven weeks for EOT.   ~ Increase water intake.   ~ No alcohol use.     2. Morbid obesity: Given evidence of weight gain in the setting of poor dietary habits and per patient in ability to exercise. I do feel he could benefit with seeing Dr. Rae Mar, clinical psychologist with our department who acts as life coach for our patients. Referral order placed.      3.  Immunity hepatitis A and B: Virology studies ordered. Given low suspicion he has been vaccinated. Initiated Twinrix series today.     4. Flu vaccine offered, patient declined.     5.  Hypertension, poorly controlled: follow up with PCP this afternoon. Stressed to patient the grave importance of missing his medication. Increase risk for cardiovascular event in the setting of past medical history and continued tobacco use. Symptomatic at visit. Recommended immediate care of being seen urgent care or ED. Patient declined. Instructed if symptoms worsen to present to ED or call 911.   ~ Discussed need for healthier diet and possible options as well as adhering to low sodium diet, less than 2,000 mg total daily.     6. Tobacco use: Smoking cessation discussed.     7. Alcohol use: Stressed grave importance of strict avoidance of any alcohol during HCV txment and recommended following.     8. HCC screening: Up to date. Warrants surveillance every six months with AFP tumor marker.     9. History of Barrett's esophagus: Pending EGD report. Scheduled 12/21/2018.     All patient's questions were answered to his satisfaction during office visit.     Rodman Key, DNP, FNP-BC  [facility]  [address]  [place] [address]  Phone [phone]

## 2018-12-01 NOTE — Unmapped (Signed)
1.  Laboratory studies done today as part of continued hepatitis C care.   2.  Office follow up seven weeks.   3.  Continue taking Epclusa one tablet daily. Avoid missing any doses.   4.  No alcohol use.   5.  First hepatitis A and B vaccine administered today. Second vaccine next visit if warranted based on lab work today.   6.  No alcohol use.   7.  Increase water intake during treatment.   8.  Need to follow up with your primary care provider this afternoon as discussed.   9.  Try to limit salt intake to less than 2,000 mg total daily. Referral order placed for you to see Rae Mar, clinical psychologist to help with nutritional management.   10.  Any questions or concerns please let me know.

## 2018-12-02 LAB — HEPATITIS C RNA, QUANTITATIVE, PCR: HCV RNA LOG10: 1.88 {Log_IU}/mL — ABNORMAL HIGH (ref <0.00)

## 2018-12-02 LAB — HCV RNA(IU): Hepatitis C virus RNA:ACnc:Pt:Ser/Plas:Qn:Probe.amp.tar: 76 — ABNORMAL HIGH

## 2018-12-07 NOTE — Unmapped (Signed)
[facility] Refill Coordination Note    Specialty Medication(s) to be Shipped:   Infectious Disease: Epclusa    Other medication(s) to be shipped: n/a     Douglas Schultz, DOB: 03-Jan-1961  Phone: [phone] (home)       All above HIPAA information was verified with patient.     Completed refill call assessment today to schedule patient's medication shipment from the [facility] [phone]).       Specialty medication(s) and dose(s) confirmed: Regimen is correct and unchanged.   Changes to medications: Douglas Schultz reports no changes reported at this time.  Changes to insurance: No  Questions for the pharmacist: Yes: transferred to [facility] in clinic- he wasn't sure when he could take the clonazepam- he's been taking it with all his other medications but it's as needed- wondering when he could take it with the epclusa    The patient will receive a drug information handout for each medication shipped and additional FDA Medication Guides as required.      DISEASE/MEDICATION-SPECIFIC INFORMATION        For Hepatitis C patients:  Treatment start date: 11/3  Current treatment week: 6    SPECIALTY MEDICATION ADHERENCE     Medication Adherence    Patient reported X missed doses in the last month:  0  Specialty Medication:  epclusa  Patient is on additional specialty medications:  No  Patient is on more than two specialty medications:  No  Any gaps in refill history greater than 2 weeks in the last 3 months:  no  Demonstrates understanding of importance of adherence:  yes  Informant:  patient  Reliability of informant:  reliable      Adherence tools used:  alarm, cell phone          Confirmed plan for next specialty medication refill:  delivery by pharmacy  Refills needed for supportive medications:  not needed          Refill Coordination    Has the Patients' Contact Information Changed:  No  Is the Shipping Address Different:  No         epclusa 400-100: patient has 7 tablets on hand at this time      [facility]     Shipping address confirmed in Epic.     Delivery Scheduled: Yes, Expected medication delivery date: 12/20.     Medication will be delivered via Next Day Courier to the home address in Epic WAM.    Renette Butters   [facility] Specialty Technician

## 2018-12-09 MED FILL — EPCLUSA 400 MG-100 MG TABLET: 28 days supply | Qty: 28 | Fill #2 | Status: AC

## 2018-12-09 MED FILL — EPCLUSA 400 MG-100 MG TABLET: ORAL | 28 days supply | Qty: 28 | Fill #2

## 2018-12-12 ENCOUNTER — Emergency Department
Admission: EM | Admit: 2018-12-12 | Discharge: 2018-12-13 | Disposition: A | Discharge status: Other Acute Inpatient Hospital | Payer: Medicaid Other | Attending: Emergency Medicine | Admitting: Emergency Medicine

## 2018-12-12 ENCOUNTER — Encounter: Payer: Self-pay | Admitting: Emergency Medicine

## 2018-12-12 ENCOUNTER — Other Ambulatory Visit: Payer: Self-pay

## 2018-12-12 DIAGNOSIS — F419 Anxiety disorder, unspecified: Secondary | ICD-10-CM | POA: Diagnosis not present

## 2018-12-12 DIAGNOSIS — F17210 Nicotine dependence, cigarettes, uncomplicated: Secondary | ICD-10-CM | POA: Diagnosis not present

## 2018-12-12 DIAGNOSIS — F329 Major depressive disorder, single episode, unspecified: Secondary | ICD-10-CM | POA: Diagnosis not present

## 2018-12-12 DIAGNOSIS — Z7982 Long term (current) use of aspirin: Secondary | ICD-10-CM | POA: Diagnosis not present

## 2018-12-12 DIAGNOSIS — I10 Essential (primary) hypertension: Secondary | ICD-10-CM | POA: Diagnosis not present

## 2018-12-12 DIAGNOSIS — Z9049 Acquired absence of other specified parts of digestive tract: Secondary | ICD-10-CM | POA: Diagnosis not present

## 2018-12-12 DIAGNOSIS — Z79899 Other long term (current) drug therapy: Secondary | ICD-10-CM | POA: Diagnosis not present

## 2018-12-12 DIAGNOSIS — F99 Mental disorder, not otherwise specified: Secondary | ICD-10-CM | POA: Diagnosis present

## 2018-12-12 DIAGNOSIS — Z859 Personal history of malignant neoplasm, unspecified: Secondary | ICD-10-CM | POA: Insufficient documentation

## 2018-12-12 DIAGNOSIS — F1220 Cannabis dependence, uncomplicated: Secondary | ICD-10-CM | POA: Diagnosis not present

## 2018-12-12 DIAGNOSIS — I2510 Atherosclerotic heart disease of native coronary artery without angina pectoris: Secondary | ICD-10-CM | POA: Diagnosis not present

## 2018-12-12 DIAGNOSIS — R45851 Suicidal ideations: Secondary | ICD-10-CM | POA: Diagnosis not present

## 2018-12-12 DIAGNOSIS — Z8673 Personal history of transient ischemic attack (TIA), and cerebral infarction without residual deficits: Secondary | ICD-10-CM | POA: Insufficient documentation

## 2018-12-12 DIAGNOSIS — F1420 Cocaine dependence, uncomplicated: Secondary | ICD-10-CM | POA: Insufficient documentation

## 2018-12-12 DIAGNOSIS — F1020 Alcohol dependence, uncomplicated: Secondary | ICD-10-CM | POA: Insufficient documentation

## 2018-12-12 LAB — CBC
HCT: 54.7 % — ABNORMAL HIGH (ref 39.0–52.0)
Hemoglobin: 17.9 g/dL — ABNORMAL HIGH (ref 13.0–17.0)
MCH: 29.4 pg (ref 26.0–34.0)
MCHC: 32.7 g/dL (ref 30.0–36.0)
MCV: 90 fL (ref 80.0–100.0)
NRBC: 0 % (ref 0.0–0.2)
PLATELETS: 190 10*3/uL (ref 150–400)
RBC: 6.08 MIL/uL — AB (ref 4.22–5.81)
RDW: 12.8 % (ref 11.5–15.5)
WBC: 14.3 10*3/uL — ABNORMAL HIGH (ref 4.0–10.5)

## 2018-12-12 LAB — URINE DRUG SCREEN, QUALITATIVE (ARMC ONLY)
AMPHETAMINES, UR SCREEN: NOT DETECTED
BENZODIAZEPINE, UR SCRN: NOT DETECTED
Barbiturates, Ur Screen: NOT DETECTED
CANNABINOID 50 NG, UR ~~LOC~~: NOT DETECTED
Cocaine Metabolite,Ur ~~LOC~~: NOT DETECTED
MDMA (Ecstasy)Ur Screen: NOT DETECTED
Methadone Scn, Ur: NOT DETECTED
Opiate, Ur Screen: NOT DETECTED
PHENCYCLIDINE (PCP) UR S: NOT DETECTED
TRICYCLIC, UR SCREEN: POSITIVE — AB

## 2018-12-12 LAB — COMPREHENSIVE METABOLIC PANEL
ALT: 37 U/L (ref 0–44)
ANION GAP: 12 (ref 5–15)
AST: 30 U/L (ref 15–41)
Albumin: 4.3 g/dL (ref 3.5–5.0)
Alkaline Phosphatase: 55 U/L (ref 38–126)
BUN: 21 mg/dL — ABNORMAL HIGH (ref 6–20)
CHLORIDE: 101 mmol/L (ref 98–111)
CO2: 26 mmol/L (ref 22–32)
CREATININE: 0.94 mg/dL (ref 0.61–1.24)
Calcium: 9.7 mg/dL (ref 8.9–10.3)
Glucose, Bld: 118 mg/dL — ABNORMAL HIGH (ref 70–99)
Potassium: 3.9 mmol/L (ref 3.5–5.1)
SODIUM: 139 mmol/L (ref 135–145)
Total Bilirubin: 0.8 mg/dL (ref 0.3–1.2)
Total Protein: 7.8 g/dL (ref 6.5–8.1)

## 2018-12-12 LAB — ACETAMINOPHEN LEVEL

## 2018-12-12 LAB — SALICYLATE LEVEL

## 2018-12-12 LAB — ETHANOL

## 2018-12-12 MED ORDER — ALBUTEROL SULFATE (2.5 MG/3ML) 0.083% IN NEBU
3.0000 mL | INHALATION_SOLUTION | Freq: Four times a day (QID) | RESPIRATORY_TRACT | Status: DC | PRN
  Filled 2018-12-12: qty 3

## 2018-12-12 MED ORDER — LISINOPRIL 10 MG PO TABS: 20.0000 mg | ORAL_TABLET | Freq: Once | ORAL | Status: DC

## 2018-12-12 MED ORDER — NON FORMULARY: 1.0000 | Freq: Every day | Status: DC

## 2018-12-12 MED ORDER — SOFOSBUVIR-VELPATASVIR 400-100 MG PO TABS: 1.0000 | ORAL_TABLET | Freq: Every evening | ORAL | Status: DC

## 2018-12-12 MED ORDER — ALBUTEROL SULFATE HFA 108 (90 BASE) MCG/ACT IN AERS
2.0000 | INHALATION_SPRAY | Freq: Four times a day (QID) | RESPIRATORY_TRACT | Status: DC | PRN
  Filled 2018-12-12: qty 6.7

## 2018-12-12 MED ORDER — FAMOTIDINE 20 MG PO TABS
40.0000 mg | ORAL_TABLET | Freq: Every day | ORAL | Status: DC
  Administered 2018-12-12: 40 mg via ORAL
  Filled 2018-12-12: qty 2

## 2018-12-12 MED ORDER — SOFOSBUVIR-VELPATASVIR 400-100 MG PO TABS
1.0000 | ORAL_TABLET | Freq: Every day | ORAL | Status: DC
  Administered 2018-12-12: 1 via ORAL

## 2018-12-12 MED ORDER — FAMOTIDINE 20 MG PO TABS
20.0000 mg | ORAL_TABLET | Freq: Once | ORAL | Status: DC
  Filled 2018-12-12: qty 1

## 2018-12-12 MED ORDER — QUETIAPINE FUMARATE ER 200 MG PO TB24
400.0000 mg | ORAL_TABLET | Freq: Every day | ORAL | Status: DC
  Administered 2018-12-12: 400 mg via ORAL
  Filled 2018-12-12: qty 2

## 2018-12-12 MED ORDER — LISINOPRIL 20 MG PO TABS
20.0000 mg | ORAL_TABLET | Freq: Every day | ORAL | Status: DC
  Administered 2018-12-12: 20 mg via ORAL
  Filled 2018-12-12: qty 2

## 2018-12-12 MED ORDER — ACETAMINOPHEN 325 MG PO TABS
650.0000 mg | ORAL_TABLET | Freq: Once | ORAL | Status: AC
  Administered 2018-12-12: 650 mg via ORAL
  Filled 2018-12-12: qty 2

## 2018-12-12 MED ORDER — SOFOSBUVIR-VELPATASVIR 400-100 MG PO TABS: 1.0000 | ORAL_TABLET | Freq: Every day | ORAL | Status: DC

## 2018-12-12 NOTE — ED Notes (Signed)
Pt dressed out. Pt belongings bags: 2 shirts, 3 pants, 2 briefs, wallet no cash, phone, belt, box of cigarettes, jacket. Pt allowed to keep glasses.

## 2018-12-12 NOTE — ED Notes (Addendum)
Pt oriented to unit.   Pt given juice and remote of TV.   Maintained on 15 minute checks and observation by security camera for safety.

## 2018-12-12 NOTE — BH Assessment (Signed)
Assessment Note  Ian [name] is an 57 y.o. male. with a history of Anxiety and Depression. Pt. reports "I need my medication but my disability income is just not enough to pay my rent and get my medications, it just cost to much". Pt. admits to SI, but has no plan. Pt. shared he does not feel safe going back to his home at the present time because he does not feel he can keep himself safe. Pt. Came to [facility] ED recently 11/05/18 for same symptoms. Pt. came into [facility] ED voluntarily/ambulatory to be admitted. Pt. reported his neighbor gave him a ride, "he's the only person I know that cares about me, he brought me to the hospital". Pt. reports no recent substance abuse or alcohol use. Pt. reported that he has Hep C and can't drink alcohol. Pt. reports the Hep C medication was no cost and he brought it with him to hospital in case he is admitted. Pt. expresses hopelessness and despair. Pt. denies and AV/H. Pt. appears to be cooperative, but expressed he has a heightened level of anxiety because he feels all alone, especially during Christmas. Pt. states, "I just wish it would be over (Christmas)". Pt. reports recent weight gain because of problems with back pain, and not being able to move around freely. Pt. reported being diagnosed with Cancer and heart disease. Pt. reports this is really stressing him out, because of not having the money for the medications to help with his health. Pt. reports he is smoking cigarettes (half a pack to pack a day). Pt. reported smoking close to a pack before he came into [facility] ED throughout the day.  Pt. reports he previously used Cocaine and Marijuana, but has not used any of these substances within the past few months. Pt. reports he started hoarding a lot of his personal items at his home. Pt. States this is something new, and this is making it hard to get around in his house. Pt.reports his house is beginning to look messy and unkept.   Diagnosis: Anxiety and  Depression   Past Medical History:  Past Medical History:  Diagnosis Date  . Anxiety   . Barrett esophagus   . Cancer ([place])   . Coronary artery disease   . Depression   . GERD (gastroesophageal reflux disease)   . Hypertension   . Pre-diabetes   . Stroke [facility]) 2014   "mini-stroke" per patient    Past Surgical History:  Procedure Laterality Date  . ANGIOPLASTY    . APPENDECTOMY    . CARDIAC CATHETERIZATION    . CHOLECYSTECTOMY    . WRIST SURGERY Right    age 68    Family History:  Family History  Problem Relation Age of Onset  . Stroke Father   . Leukemia Father   . Breast cancer Sister     Social History:  reports that he has been smoking cigarettes. He has been smoking about 1.00 pack per day. He has never used smokeless tobacco. He reports current alcohol use. He reports current drug use. Drugs: Cocaine and Marijuana.  Additional Social History:  Alcohol / Drug Use Pain Medications: SEE MAR Prescriptions: SEE MAR Over the Counter: SEE MAR History of alcohol / drug use?: Yes Negative Consequences of Use: Personal relationships Substance #1 Name of Substance 1: Alcohol 1 - Age of First Use: 15 1 - Amount (size/oz): 2-3 beers 1 - Frequency: dont drink anymore 1 - Last Use / Amount: month  CIWA: CIWA-Ar BP: Marland Kitchen)  176/125 Pulse Rate: (!) 101 COWS:    Allergies:  Allergies  Allergen Reactions  . Fluoxetine Swelling    Anxiety, itching  . Hydrocodone-Acetaminophen Itching  . Mirtazapine Other (See Comments)    nightmares  . Gabapentin Other (See Comments)    Bad dreams and felt bad  . Tramadol Other (See Comments)    Home Medications: (Not in a hospital admission)   OB/GYN Status:  No LMP for male patient.  General Assessment Data Location of Assessment: [name] ED TTS Assessment: In system Is this a Tele or Face-to-Face Assessment?: Face-to-Face Is this an Initial Assessment or a Re-assessment for this encounter?: Initial Assessment Patient  Accompanied by:: N/A Language Other than English: No Living Arrangements: Other (Comment) What gender do you identify as?: Male Marital status: Single Maiden name: NA Pregnancy Status: No Living Arrangements: Alone Can pt return to current living arrangement?: Yes Admission Status: Voluntary Is patient capable of signing voluntary admission?: Yes Referral Source: Self/Family/Friend Insurance type: [place] Screening Exam ([place]) Medical Exam completed: Yes  Crisis Care Plan Living Arrangements: Alone Legal Guardian: Other:(NA) Name of Psychiatrist: None Reported Name of Therapist: None Reported  Education Status Is patient currently in school?: No Is the patient employed, unemployed or receiving disability?: Receiving disability income  Risk to self with the past 6 months Suicidal Ideation: Yes-Currently Present Has patient been a risk to self within the past 6 months prior to admission? : No Suicidal Intent: Yes-Currently Present Has patient had any suicidal intent within the past 6 months prior to admission? : Yes Is patient at risk for suicide?: No, but patient needs Medical Clearance Suicidal Plan?: No Has patient had any suicidal plan within the past 6 months prior to admission? : No Access to Means: No What has been your use of drugs/alcohol within the last 12 months?: None  Previous Attempts/Gestures: No How many times?: 0 Other Self Harm Risks: 0 Triggers for Past Attempts: None known Intentional Self Injurious Behavior: None Family Suicide History: Unknown Recent stressful life event(s): Financial Problems, Other (Comment)(Holidays) Persecutory voices/beliefs?: No Depression: Yes Depression Symptoms: Fatigue, Feeling worthless/self pity, Feeling angry/irritable Substance abuse history and/or treatment for substance abuse?: No Suicide prevention information given to non-admitted patients: Not applicable  Risk to Others within the past 6  months Homicidal Ideation: No Does patient have any lifetime risk of violence toward others beyond the six months prior to admission? : No Thoughts of Harm to Others: No Current Homicidal Intent: No Current Homicidal Plan: No Access to Homicidal Means: No Identified Victim: NA History of harm to others?: No Assessment of Violence: None Noted Violent Behavior Description: None Does patient have access to weapons?: No Criminal Charges Pending?: No Does patient have a court date: No Is patient on probation?: Unknown  Psychosis Hallucinations: None noted Delusions: None noted  Mental Status Report Appearance/Hygiene: Poor hygiene, In scrubs Eye Contact: Fair Motor Activity: Agitation, Mannerisms, Restlessness, Shuffling Speech: Rapid, Pressured, Loud Level of Consciousness: Restless, Irritable Mood: Depressed, Despair, Helpless, Irritable, Worthless, low self-esteem Affect: Depressed, Irritable, Sad Anxiety Level: Moderate Thought Processes: Circumstantial Judgement: Unimpaired Orientation: Person, Place, Time, Situation Obsessive Compulsive Thoughts/Behaviors: None  Cognitive Functioning Concentration: Fair Memory: Recent Intact Is patient IDD: No Insight: Fair Impulse Control: Fair Appetite: Good Have you had any weight changes? : Gain Amount of the weight change? (lbs): 20 lbs Sleep: No Change Total Hours of Sleep: 6 Vegetative Symptoms: None  ADLScreening [facility] Assessment Services) Patient's cognitive ability adequate to safely complete  daily activities?: Yes Patient able to express need for assistance with ADLs?: Yes Independently performs ADLs?: Yes (appropriate for developmental age)  Prior Inpatient Therapy Prior Inpatient Therapy: No  Prior Outpatient Therapy Prior Outpatient Therapy: No Does patient have an ACCT team?: No Does patient have Intensive In-House Services?  : No Does patient have [facility] services? : No Does patient have P4CC services?:  No  ADL Screening (condition at time of admission) Patient's cognitive ability adequate to safely complete daily activities?: Yes Is the patient deaf or have difficulty hearing?: No Does the patient have difficulty seeing, even when wearing glasses/contacts?: No Does the patient have difficulty concentrating, remembering, or making decisions?: No Patient able to express need for assistance with ADLs?: Yes Does the patient have difficulty dressing or bathing?: No Independently performs ADLs?: Yes (appropriate for developmental age) Does the patient have difficulty walking or climbing stairs?: No Weakness of Legs: None Weakness of Arms/Hands: None  Home Assistive Devices/Equipment Home Assistive Devices/Equipment: None  Therapy Consults (therapy consults require a physician order) PT Evaluation Needed: No OT Evalulation Needed: No SLP Evaluation Needed: No Abuse/Neglect Assessment (Assessment to be complete while patient is alone) Abuse/Neglect Assessment Can Be Completed: Yes Physical Abuse: Denies Verbal Abuse: Denies Sexual Abuse: Denies Exploitation of patient/patient's resources: Denies Self-Neglect: Denies Values / Beliefs Cultural Requests During Hospitalization: None Spiritual Requests During Hospitalization: None Consults Spiritual Care Consult Needed: No Social Work Consult Needed: No Regulatory affairs officer (For Healthcare) Does Patient Have a Medical Advance Directive?: No Would patient like information on creating a medical advance directive?: No - Patient declined       Child/Adolescent Assessment Running Away Risk: Denies Bed-Wetting: Denies Destruction of Property: Denies Cruelty to Animals: Denies Stealing: Denies Rebellious/Defies Authority: Denies Satanic Involvement: Denies Science writer: Denies Problems at [employer]: Denies Gang Involvement: Denies  Disposition:  Disposition Initial Assessment Completed for this Encounter: Yes Patient referred to:  (Pending Dr. Judd Gaudier)  On Site Evaluation by:   Reviewed with Physician:    Noor Vidales R, MA, LCAS, CCSI 12/12/2018 5:43 PM

## 2018-12-12 NOTE — ED Notes (Signed)
Hourly rounding reveals patient in room. No complaints, stable, in no acute distress. Q15 minute rounds and monitoring via Security Cameras to continue. 

## 2018-12-12 NOTE — ED Notes (Signed)
TTS at bedside. 

## 2018-12-12 NOTE — ED Triage Notes (Addendum)
PT to ED via POV, pt states " I just need my meds and to get back on them"  PT admits to SI with no plan. Expressing hopelessness. Denies AVH. PT A&Ox4.Pt calm and cooperative. Denies ETOH or drug use

## 2018-12-12 NOTE — ED Provider Notes (Signed)
[facility] Emergency Department Provider Note ____________________________________________   First MD Initiated Contact with Patient 12/12/18 1521     (approximate)  I have reviewed the triage vital signs and the nursing notes.   HISTORY  Chief Complaint No chief complaint on file.    HPI Ian [name] is a 57 y.o. male with PMH as noted below who presents with suicidal ideation, gradual onset over the last few weeks, and with no specific plan.  The patient states that he has been unable to afford most of his medications because he is on disability, and has been off of both his mental health and hypertension medications for about 1 week.  The patient states that he has been sad and hopeless especially around the holidays and wonders why he is here.  He states that he has had a prior suicide attempt many years ago.  He denies any acute medical symptoms.    Past Medical History:  Diagnosis Date  . Anxiety   . Barrett esophagus   . Cancer ([place])   . Coronary artery disease   . Depression   . GERD (gastroesophageal reflux disease)   . Hypertension   . Pre-diabetes   . Stroke [facility]) 2014   "mini-stroke" per patient    Patient Active Problem List   Diagnosis Date Noted  . Leukocytosis 05/04/2018  . Severe recurrent major depression without psychotic features ([place]) 11/05/2017  . Dyslipidemia 01/07/2017  . Barrett's esophagus 01/07/2017  . Bipolar I disorder, most recent episode depressed with anxious distress ([place]) 01/06/2017  . Cannabis use disorder, moderate, dependence ([place]) 01/06/2017  . Tobacco use disorder 01/06/2017  . Alcohol use disorder, moderate, dependence ([place]) 10/06/2016  . Substance induced mood disorder ([place]) 10/06/2016  . Cocaine use disorder, moderate, dependence ([place]) 10/06/2016  . Depression 10/06/2016  . Acute left-sided weakness 05/03/2015  . CVA (cerebral infarction) 05/03/2015  . Weakness 11/16/2013  . Chest pain 11/16/2013    . HTN (hypertension) 11/16/2013  . Left-sided weakness 11/16/2013  . Torsades de pointes ([place]) 11/16/2013  . Hemiplegia, unspecified, affecting nondominant side 11/15/2013  . Speech and language deficits 11/15/2013    Past Surgical History:  Procedure Laterality Date  . ANGIOPLASTY    . APPENDECTOMY    . CARDIAC CATHETERIZATION    . CHOLECYSTECTOMY    . WRIST SURGERY Right    age 68    Prior to Admission medications   Medication Sig Start Date End Date Taking? Authorizing Provider  albuterol (PROVENTIL HFA;VENTOLIN HFA) 108 (90 Base) MCG/ACT inhaler Inhale 2 puffs into the lungs every 6 (six) hours as needed for wheezing or shortness of breath. 11/05/18   Earleen Newport, MD  aspirin 81 MG EC tablet Take 1 tablet (81 mg total) by mouth daily. 11/05/18   Earleen Newport, MD  atorvastatin (LIPITOR) 40 MG tablet Take 1 tablet (40 mg total) by mouth daily at 6 PM. 11/05/18 01/04/19  Earleen Newport, MD  busPIRone (BUSPAR) 10 MG tablet Take 1 tablet (10 mg total) by mouth 3 (three) times daily. 11/05/18   Earleen Newport, MD  clonazePAM (KLONOPIN) 0.5 MG tablet Take 1 tablet (0.5 mg total) by mouth at bedtime. 11/05/18   Earleen Newport, MD  escitalopram (LEXAPRO) 10 MG tablet Take 1 tablet (10 mg total) by mouth daily. 05/13/18   [name], [name], MD  gabapentin (NEURONTIN) 300 MG capsule Take 1 capsule (300 mg total) by mouth 3 (three) times daily. 05/13/18 07/12/18  [name], [name], MD  hydrOXYzine (ATARAX/VISTARIL) 50 MG tablet Take 1 tablet (50 mg total) by mouth every 6 (six) hours as needed for anxiety. 05/13/18   [name], [name], MD  lisinopril (PRINIVIL,ZESTRIL) 20 MG tablet Take 1 tablet (20 mg total) by mouth daily. 11/05/18   Earleen Newport, MD  mirtazapine (REMERON) 15 MG tablet Take 1 tablet (15 mg total) by mouth at bedtime. 05/13/18   [name], [name], MD  omeprazole (PRILOSEC) 20 MG capsule Take 1 capsule (20 mg total) by mouth daily. 05/13/18    [name], [name], MD  QUEtiapine (SEROQUEL XR) 400 MG 24 hr tablet Take 1 tablet (400 mg total) by mouth at bedtime. 05/13/18   [name], [name], MD    Allergies Fluoxetine; Hydrocodone-acetaminophen; Mirtazapine; Gabapentin; and Tramadol  Family History  Problem Relation Age of Onset  . Stroke Father   . Leukemia Father   . Breast cancer Sister     Social History Social History   Tobacco Use  . Smoking status: Current Every Day Smoker    Packs/day: 1.00    Types: Cigarettes  . Smokeless tobacco: Never Used  Substance Use Topics  . Alcohol use: Yes    Comment: denies being a daily drinker but did have alcohol today  . Drug use: Yes    Types: Cocaine, Marijuana    Comment: used cocaine today; denies smoking marijuana for years    Review of Systems  Constitutional: No fever. Eyes: No visual changes. ENT: No sore throat. Cardiovascular: Denies chest pain. Respiratory: Denies shortness of breath. Gastrointestinal: No vomiting or diarrhea.  Genitourinary: Negative for frequency.  Musculoskeletal: Negative for back pain. Skin: Negative for rash. Neurological: Positive for intermittent headaches.  ____________________________________________   PHYSICAL EXAM:  VITAL SIGNS: ED Triage Vitals  Enc Vitals Group     BP 12/12/18 1456 (!) 197/120     Pulse Rate 12/12/18 1456 (!) 101     Resp 12/12/18 1456 16     Temp 12/12/18 1456 98 F (36.7 C)     Temp Source 12/12/18 1456 Oral     SpO2 12/12/18 1456 99 %     Weight 12/12/18 1454 288 lb (130.6 kg)     Height 12/12/18 1454 6' (1.829 m)     Head Circumference --      Peak Flow --      Pain Score 12/12/18 1454 8     Pain Loc --      Pain Edu? --      Excl. in [place]? --     Constitutional: Alert and oriented. Well appearing and in no acute distress. Eyes: Conjunctivae are normal.  Head: Atraumatic. Nose: No congestion/rhinnorhea. Mouth/Throat: Mucous membranes are moist.   Neck: Normal range of motion.   Cardiovascular:  Good peripheral circulation. Respiratory: Normal respiratory effort.  Gastrointestinal: No distention.  Musculoskeletal: Extremities warm and well perfused.  Neurologic:  Normal speech and language. No gross focal neurologic deficits are appreciated.  Skin:  Skin is warm and dry. No rash noted. Psychiatric: Speech and behavior are normal.  ____________________________________________   LABS (all labs ordered are listed, but only abnormal results are displayed)  Labs Reviewed  COMPREHENSIVE METABOLIC PANEL - Abnormal; Notable for the following components:      Result Value   Glucose, Bld 118 (*)    BUN 21 (*)    All other components within normal limits  ACETAMINOPHEN LEVEL - Abnormal; Notable for the following components:   Acetaminophen (Tylenol), Serum <  10 (*)    All other components within normal limits  CBC - Abnormal; Notable for the following components:   WBC 14.3 (*)    RBC 6.08 (*)    Hemoglobin 17.9 (*)    HCT 54.7 (*)    All other components within normal limits  URINE DRUG SCREEN, QUALITATIVE ([facility] ONLY) - Abnormal; Notable for the following components:   Tricyclic, Ur Screen POSITIVE (*)    All other components within normal limits  ETHANOL  SALICYLATE LEVEL   ____________________________________________  EKG   ____________________________________________  RADIOLOGY    ____________________________________________   PROCEDURES  Procedure(s) performed: No  Procedures  Critical Care performed: No ____________________________________________   INITIAL IMPRESSION / ASSESSMENT AND PLAN / ED COURSE  Pertinent labs & imaging results that were available during my care of the patient were reviewed by me and considered in my medical decision making (see chart for details).  57 year old male with PMH as noted above and a history of bipolar disorder presents with suicidal ideation with no plan, and states that he has not been on most  of his medications over the last week.  He denies any acute medical complaints.  On exam the patient is comfortable appearing.  His vital signs are normal except for hypertension although he has no specific symptoms related to this.  The remainder of the exam is unremarkable.  At this time the patient is here voluntarily and is willing to cooperate with evaluation.  Therefore I will not place him under involuntary commitment at this time.  I have consulted TTS and will consult tele-psychiatry.  We will obtain lab work-up for medical clearance.  ----------------------------------------- 6:19 PM on 12/12/2018 -----------------------------------------  Lab work-up is unremarkable except for slightly elevated WBC count which is nonspecific.  The patient has no symptoms of infection.  He is hypertensive but this is consistent with being noncompliant with his medications.  The [facility].  The tele-psychiatrist recommends inpatient admission.  I have placed the patient under involuntary commitment. ____________________________________________   FINAL CLINICAL IMPRESSION(S) / ED DIAGNOSES  Final diagnoses:  Suicidal ideation      NEW MEDICATIONS STARTED DURING THIS VISIT:  New Prescriptions   No medications on file     Note:  This document was prepared using Dragon voice recognition software and may include unintentional dictation errors.    Arta Silence, MD 12/12/18 [phone]

## 2018-12-12 NOTE — BH Assessment (Signed)
Per Dr Uvaldo Rising this writer will speak to the pt concerning prescribed home medications and date of last doses. At last check pt's BP was 176/125. Per Dr. Cherylann Banas he will order a recheck of pt's BP to determine stability and appropriateness for transfer to BMU.   Per Dr. Uvaldo Rising an EKG has been ordered on the pt to assess his QTC interval. Currently pending bed assignment on BMU after results have been assessed and cleared.

## 2018-12-12 NOTE — ED Notes (Signed)
SOC in progress.  

## 2018-12-12 NOTE — ED Notes (Signed)
Report to include Situation, Background, Assessment, and Recommendations received from Amy B. RN. Patient alert and oriented, warm and dry, in no acute distress. Patient denies SI, HI, AVH and pain. Patient made aware of Q15 minute rounds and security cameras for their safety. Patient instructed to come to me with needs or concerns. 

## 2018-12-12 NOTE — ED Notes (Signed)
Hourly rounding reveals patient in room. Stable, in no acute distress. Q15 minute rounds and monitoring via Security Cameras to continue. 

## 2018-12-12 NOTE — ED Notes (Signed)
Report given to [facility] (Dr Mitzi Hansen).

## 2018-12-13 ENCOUNTER — Other Ambulatory Visit: Payer: Self-pay

## 2018-12-13 ENCOUNTER — Inpatient Hospital Stay
Admission: AD | Admit: 2018-12-13 | Discharge: 2018-12-23 | DRG: 885 | Disposition: A | Discharge status: Home / Self Care | Payer: Medicaid Other | Attending: Psychiatry | Admitting: Psychiatry

## 2018-12-13 ENCOUNTER — Encounter: Payer: Self-pay | Admitting: Psychiatry

## 2018-12-13 DIAGNOSIS — F319 Bipolar disorder, unspecified: Secondary | ICD-10-CM | POA: Diagnosis not present

## 2018-12-13 DIAGNOSIS — G8929 Other chronic pain: Secondary | ICD-10-CM | POA: Diagnosis present

## 2018-12-13 DIAGNOSIS — R7303 Prediabetes: Secondary | ICD-10-CM | POA: Diagnosis present

## 2018-12-13 DIAGNOSIS — F411 Generalized anxiety disorder: Secondary | ICD-10-CM | POA: Diagnosis present

## 2018-12-13 DIAGNOSIS — Z8673 Personal history of transient ischemic attack (TIA), and cerebral infarction without residual deficits: Secondary | ICD-10-CM | POA: Diagnosis not present

## 2018-12-13 DIAGNOSIS — F3130 Bipolar disorder, current episode depressed, mild or moderate severity, unspecified: Secondary | ICD-10-CM | POA: Diagnosis present

## 2018-12-13 DIAGNOSIS — G4700 Insomnia, unspecified: Secondary | ICD-10-CM | POA: Diagnosis present

## 2018-12-13 DIAGNOSIS — K219 Gastro-esophageal reflux disease without esophagitis: Secondary | ICD-10-CM | POA: Diagnosis present

## 2018-12-13 DIAGNOSIS — F17210 Nicotine dependence, cigarettes, uncomplicated: Secondary | ICD-10-CM | POA: Diagnosis present

## 2018-12-13 DIAGNOSIS — Z5181 Encounter for therapeutic drug level monitoring: Secondary | ICD-10-CM | POA: Diagnosis not present

## 2018-12-13 DIAGNOSIS — I2510 Atherosclerotic heart disease of native coronary artery without angina pectoris: Secondary | ICD-10-CM | POA: Diagnosis present

## 2018-12-13 DIAGNOSIS — R45851 Suicidal ideations: Secondary | ICD-10-CM | POA: Diagnosis present

## 2018-12-13 DIAGNOSIS — Z79899 Other long term (current) drug therapy: Secondary | ICD-10-CM

## 2018-12-13 DIAGNOSIS — F329 Major depressive disorder, single episode, unspecified: Secondary | ICD-10-CM | POA: Diagnosis not present

## 2018-12-13 DIAGNOSIS — Z7982 Long term (current) use of aspirin: Secondary | ICD-10-CM | POA: Diagnosis not present

## 2018-12-13 DIAGNOSIS — I10 Essential (primary) hypertension: Secondary | ICD-10-CM | POA: Diagnosis present

## 2018-12-13 DIAGNOSIS — F429 Obsessive-compulsive disorder, unspecified: Secondary | ICD-10-CM | POA: Diagnosis present

## 2018-12-13 DIAGNOSIS — B1920 Unspecified viral hepatitis C without hepatic coma: Secondary | ICD-10-CM | POA: Diagnosis present

## 2018-12-13 DIAGNOSIS — E785 Hyperlipidemia, unspecified: Secondary | ICD-10-CM | POA: Diagnosis present

## 2018-12-13 DIAGNOSIS — F1021 Alcohol dependence, in remission: Secondary | ICD-10-CM | POA: Diagnosis present

## 2018-12-13 DIAGNOSIS — Z823 Family history of stroke: Secondary | ICD-10-CM

## 2018-12-13 DIAGNOSIS — F339 Major depressive disorder, recurrent, unspecified: Secondary | ICD-10-CM | POA: Diagnosis present

## 2018-12-13 DIAGNOSIS — F1020 Alcohol dependence, uncomplicated: Secondary | ICD-10-CM | POA: Diagnosis present

## 2018-12-13 LAB — LIPID PANEL
Cholesterol: 131 mg/dL (ref 0–200)
HDL: 28 mg/dL — ABNORMAL LOW (ref >40)
LDL Cholesterol: 70 mg/dL (ref 0–99)
Total CHOL/HDL Ratio: 4.7 RATIO
Triglycerides: 167 mg/dL — ABNORMAL HIGH (ref <150)
VLDL: 33 mg/dL (ref 0–40)

## 2018-12-13 LAB — CBC
HCT: 49.8 % (ref 39.0–52.0)
Hemoglobin: 16.5 g/dL (ref 13.0–17.0)
MCH: 29.7 pg (ref 26.0–34.0)
MCHC: 33.1 g/dL (ref 30.0–36.0)
MCV: 89.6 fL (ref 80.0–100.0)
Platelets: 178 10*3/uL (ref 150–400)
RBC: 5.56 MIL/uL (ref 4.22–5.81)
RDW: 12.8 % (ref 11.5–15.5)
WBC: 13.6 10*3/uL — ABNORMAL HIGH (ref 4.0–10.5)
nRBC: 0 % (ref 0.0–0.2)

## 2018-12-13 LAB — HEMOGLOBIN A1C
Hgb A1c MFr Bld: 6.4 % — ABNORMAL HIGH (ref 4.8–5.6)
Mean Plasma Glucose: 136.98 mg/dL

## 2018-12-13 MED ORDER — LISINOPRIL 20 MG PO TABS
20.0000 mg | ORAL_TABLET | Freq: Every day | ORAL | Status: DC
  Administered 2018-12-13 – 2018-12-23 (×11): 20 mg via ORAL
  Filled 2018-12-13 (×11): qty 1

## 2018-12-13 MED ORDER — SOFOSBUVIR-VELPATASVIR 400-100 MG PO TABS
1.0000 | ORAL_TABLET | Freq: Every day | ORAL | Status: DC
  Administered 2018-12-13 – 2018-12-22 (×10): 1 via ORAL
  Filled 2018-12-13 (×11): qty 1

## 2018-12-13 MED ORDER — IBUPROFEN 600 MG PO TABS
600.0000 mg | ORAL_TABLET | Freq: Four times a day (QID) | ORAL | Status: DC | PRN
  Administered 2018-12-13 – 2018-12-23 (×20): 600 mg via ORAL
  Filled 2018-12-13 (×20): qty 1

## 2018-12-13 MED ORDER — MAGNESIUM HYDROXIDE 400 MG/5ML PO SUSP: 30.0000 mL | Freq: Every day | ORAL | Status: DC | PRN

## 2018-12-13 MED ORDER — ADULT MULTIVITAMIN W/MINERALS CH
1.0000 | ORAL_TABLET | Freq: Every day | ORAL | Status: DC
  Administered 2018-12-13 – 2018-12-23 (×10): 1 via ORAL
  Filled 2018-12-13 (×9): qty 1

## 2018-12-13 MED ORDER — LISINOPRIL 20 MG PO TABS: 20.0000 mg | ORAL_TABLET | Freq: Every day | ORAL | Status: DC

## 2018-12-13 MED ORDER — ALUM & MAG HYDROXIDE-SIMETH 200-200-20 MG/5ML PO SUSP: 30.0000 mL | ORAL | Status: DC | PRN

## 2018-12-13 MED ORDER — LORAZEPAM 1 MG PO TABS: 1.0000 mg | ORAL_TABLET | Freq: Four times a day (QID) | ORAL | Status: DC | PRN

## 2018-12-13 MED ORDER — ESCITALOPRAM OXALATE 10 MG PO TABS
10.0000 mg | ORAL_TABLET | Freq: Every day | ORAL | Status: DC
  Administered 2018-12-13 – 2018-12-23 (×11): 10 mg via ORAL
  Filled 2018-12-13 (×11): qty 1

## 2018-12-13 MED ORDER — ATORVASTATIN CALCIUM 20 MG PO TABS
40.0000 mg | ORAL_TABLET | Freq: Every day | ORAL | Status: DC
  Administered 2018-12-13 – 2018-12-22 (×10): 40 mg via ORAL
  Filled 2018-12-13 (×10): qty 2

## 2018-12-13 MED ORDER — MOMETASONE FURO-FORMOTEROL FUM 200-5 MCG/ACT IN AERO
2.0000 | INHALATION_SPRAY | Freq: Two times a day (BID) | RESPIRATORY_TRACT | Status: DC
  Administered 2018-12-13 – 2018-12-23 (×21): 2 via RESPIRATORY_TRACT
  Filled 2018-12-13: qty 8.8

## 2018-12-13 MED ORDER — ALBUTEROL SULFATE HFA 108 (90 BASE) MCG/ACT IN AERS
2.0000 | INHALATION_SPRAY | Freq: Four times a day (QID) | RESPIRATORY_TRACT | Status: DC | PRN
  Administered 2018-12-13: 2 via RESPIRATORY_TRACT
  Filled 2018-12-13: qty 6.7

## 2018-12-13 MED ORDER — QUETIAPINE FUMARATE ER 300 MG PO TB24
300.0000 mg | ORAL_TABLET | Freq: Every day | ORAL | Status: DC
  Administered 2018-12-13 – 2018-12-22 (×10): 300 mg via ORAL
  Filled 2018-12-13 (×11): qty 1

## 2018-12-13 MED ORDER — FAMOTIDINE 20 MG PO TABS
40.0000 mg | ORAL_TABLET | Freq: Every day | ORAL | Status: DC
  Administered 2018-12-13 – 2018-12-22 (×10): 40 mg via ORAL
  Filled 2018-12-13 (×10): qty 2

## 2018-12-13 MED ORDER — THIAMINE HCL 100 MG/ML IJ SOLN
100.0000 mg | Freq: Once | INTRAMUSCULAR | Status: AC
  Administered 2018-12-13: 100 mg via INTRAMUSCULAR
  Filled 2018-12-13: qty 1

## 2018-12-13 MED ORDER — NICOTINE 21 MG/24HR TD PT24
21.0000 mg | MEDICATED_PATCH | Freq: Every day | TRANSDERMAL | Status: DC
  Administered 2018-12-13 – 2018-12-23 (×11): 21 mg via TRANSDERMAL
  Filled 2018-12-13 (×11): qty 1

## 2018-12-13 MED ORDER — HYDROXYZINE HCL 50 MG PO TABS
50.0000 mg | ORAL_TABLET | Freq: Four times a day (QID) | ORAL | Status: DC | PRN
  Administered 2018-12-13 – 2018-12-19 (×8): 50 mg via ORAL
  Filled 2018-12-13 (×8): qty 1

## 2018-12-13 MED ORDER — CLONIDINE HCL 0.1 MG PO TABS
0.1000 mg | ORAL_TABLET | Freq: Two times a day (BID) | ORAL | Status: DC | PRN
  Administered 2018-12-16 – 2018-12-18 (×3): 0.1 mg via ORAL
  Filled 2018-12-13 (×3): qty 1

## 2018-12-13 MED ORDER — ASPIRIN 81 MG PO CHEW
81.0000 mg | CHEWABLE_TABLET | Freq: Every day | ORAL | Status: DC
  Administered 2018-12-13 – 2018-12-23 (×11): 81 mg via ORAL
  Filled 2018-12-13 (×11): qty 1

## 2018-12-13 MED ORDER — VITAMIN B-1 100 MG PO TABS
100.0000 mg | ORAL_TABLET | Freq: Every day | ORAL | Status: DC
  Administered 2018-12-14 – 2018-12-23 (×10): 100 mg via ORAL
  Filled 2018-12-13 (×10): qty 1

## 2018-12-13 MED ORDER — BUSPIRONE HCL 10 MG PO TABS
10.0000 mg | ORAL_TABLET | Freq: Three times a day (TID) | ORAL | Status: DC
  Administered 2018-12-13 – 2018-12-23 (×30): 10 mg via ORAL
  Filled 2018-12-13 (×32): qty 1

## 2018-12-13 NOTE — ED Notes (Signed)
Hourly rounding reveals patient in room. No complaints, stable, in no acute distress. Q15 minute rounds and monitoring via Security Cameras to continue. 

## 2018-12-13 NOTE — Plan of Care (Signed)
Patient just recently admitted to the unit. Patient has not had sufficient time to show progressions at this time. Will continue to monitor for progressions.    Problem: Coping: Goal: Coping ability will improve Outcome: Not Progressing   Problem: Health Behavior/Discharge Planning: Goal: Identification of resources available to assist in meeting health care needs will improve Outcome: Not Progressing   Problem: Medication: Goal: Compliance with prescribed medication regimen will improve Outcome: Not Progressing   Problem: Self-Concept: Goal: Ability to disclose and discuss suicidal ideas will improve Outcome: Not Progressing Goal: Will verbalize positive feelings about self Outcome: Not Progressing   Problem: Activity: Goal: Interest or engagement in leisure activities will improve Outcome: Not Progressing   Problem: Safety: Goal: Ability to disclose and discuss suicidal ideas will improve Outcome: Not Progressing   Problem: Self-Concept: Goal: Level of anxiety will decrease Outcome: Not Progressing

## 2018-12-13 NOTE — H&P (Signed)
Psychiatric Admission Assessment Adult  Patient Identification: Ian [name] MRN:  [id number] Date of Evaluation:  12/13/2018 Chief Complaint:  Depression Principal Diagnosis: Bipolar I disorder, most recent episode depressed with anxious distress ([place]) Diagnosis:  Principal Problem:   Bipolar I disorder, most recent episode depressed with anxious distress ([place]) Active Problems:   HTN (hypertension)   Alcohol use disorder, moderate, dependence ([place])   Dyslipidemia   Major depression, recurrent, chronic ([place])   Hepatitis C  History of Present Illness: Patient has a history of recurrent depression probably part of a bipolar 2 condition as well as a history of alcohol and drug abuse.  He presented to the emergency room reporting multiple symptoms of depression with suicidal ideation.  Patient reports his mood has been depressed for about a month but getting worse the last couple weeks.  Sleep is poor.  Appetite is poor.  He barely gets out of the house and has greatly decreased all of his activities.  He feels anxious much of the time.  He has passive suicidal thoughts and feelings of hopelessness but retains good insight and has no intention to act on them.  Denies any psychotic symptoms.  Patient reports he has lots of stress including the holidays and interactions with his family and finances.  He still does have some positive things in his life and particularly appreciates his peers support worker.  He has been off his medicine for a couple weeks. Associated Signs/Symptoms: Depression Symptoms:  depressed mood, anhedonia, insomnia, feelings of worthlessness/guilt, difficulty concentrating, hopelessness, suicidal thoughts without plan, anxiety, (Hypo) Manic Symptoms:  None Anxiety Symptoms:  Excessive Worry, Psychotic Symptoms:  Paranoia, PTSD Symptoms: Negative Total Time spent with patient: 1 hour  Past Psychiatric History: Patient has a recurrent history of depression.  Has a  long history of alcohol and cocaine abuse.  Reports that he has been staying off of alcohol and drugs for 5 months now.  Patient's motivation for this is that he has been diagnosed with hepatitis C and is now receiving treatment for it.  He does have a past history of suicidal threats and suicide attempts.  Has done well in the past on Seroquel and possibly some SSRIs.  He says the mirtazapine he took last time he was here gave him nightmares and he does not want to take it again.  He has a provider at [facility]  Is the patient at risk to self? Yes.    Has the patient been a risk to self in the past 6 months? Yes.    Has the patient been a risk to self within the distant past? Yes.    Is the patient a risk to others? No.  Has the patient been a risk to others in the past 6 months? No.  Has the patient been a risk to others within the distant past? No.   Prior Inpatient Therapy:   Prior Outpatient Therapy:    Alcohol Screening: 1. How often do you have a drink containing alcohol?: Never(quit drinking three months ago reportedly) 2. How many drinks containing alcohol do you have on a typical day when you are drinking?: 5 or 6 3. How often do you have six or more drinks on one occasion?: Never AUDIT-C Score: 2 4. How often during the last year have you found that you were not able to stop drinking once you had started?: Never 5. How often during the last year have you failed to do what was normally expected from you  becasue of drinking?: Never 6. How often during the last year have you needed a first drink in the morning to get yourself going after a heavy drinking session?: Never 7. How often during the last year have you had a feeling of guilt of remorse after drinking?: Never 8. How often during the last year have you been unable to remember what happened the night before because you had been drinking?: Never 9. Have you or someone else been injured as a result of your drinking?: No 10. Has a  relative or friend or a doctor or another health worker been concerned about your drinking or suggested you cut down?: Yes, during the last year Alcohol Use Disorder Identification Test Final Score (AUDIT): 6 Intervention/Follow-up: Brief Advice, AUDIT Score <7 follow-up not indicated, Alcohol Education, Continued Monitoring, Medication Offered/Prescribed Substance Abuse History in the last 12 months:  Yes.   Consequences of Substance Abuse: Medical Consequences:  Worsening of depression as well as his multiple medical problems Previous Psychotropic Medications: Yes  Psychological Evaluations: Yes  Past Medical History:  Past Medical History:  Diagnosis Date  . Anxiety   . Barrett esophagus   . Cancer ([place])   . Coronary artery disease   . Depression   . GERD (gastroesophageal reflux disease)   . Hypertension   . Pre-diabetes   . Stroke [facility]) 2014   "mini-stroke" per patient    Past Surgical History:  Procedure Laterality Date  . ANGIOPLASTY    . APPENDECTOMY    . CARDIAC CATHETERIZATION    . CHOLECYSTECTOMY    . WRIST SURGERY Right    age 28   Family History:  Family History  Problem Relation Age of Onset  . Stroke Father   . Leukemia Father   . Breast cancer Sister    Family Psychiatric  History: Alcohol Tobacco Screening: Have you used any form of tobacco in the last 30 days? (Cigarettes, Smokeless Tobacco, Cigars, and/or Pipes): Yes Tobacco use, Select all that apply: 5 or more cigarettes per day Are you interested in Tobacco Cessation Medications?: Yes, will notify MD for an order Counseled patient on smoking cessation including recognizing danger situations, developing coping skills and basic information about quitting provided: Yes Social History:  Social History   Substance and Sexual Activity  Alcohol Use Yes   Comment: denies being a daily drinker but did have alcohol today     Social History   Substance and Sexual Activity  Drug Use Yes  . Types:  Cocaine, Marijuana   Comment: used cocaine today; denies smoking marijuana for years    Additional Social History:                           Allergies:   Allergies  Allergen Reactions  . Fluoxetine Swelling    Anxiety, itching  . Hydrocodone-Acetaminophen Itching  . Mirtazapine Other (See Comments)    nightmares  . Gabapentin Other (See Comments)    Bad dreams and felt bad  . Tramadol Other (See Comments)   Lab Results:  Results for orders placed or performed during the hospital encounter of 12/13/18 (from the past 48 hour(s))  CBC     Status: Abnormal   Collection Time: 12/13/18  6:57 AM  Result Value Ref Range   WBC 13.6 (H) 4.0 - 10.5 K/uL   RBC 5.56 4.22 - 5.81 MIL/uL   Hemoglobin 16.5 13.0 - 17.0 g/dL   HCT 49.8 39.0 - 52.0 %  MCV 89.6 80.0 - 100.0 fL   MCH 29.7 26.0 - 34.0 pg   MCHC 33.1 30.0 - 36.0 g/dL   RDW 12.8 11.5 - 15.5 %   Platelets 178 150 - 400 K/uL   nRBC 0.0 0.0 - 0.2 %    Comment: Performed at [facility], [address] [place], [place] [address]  Hemoglobin A1c     Status: Abnormal   Collection Time: 12/13/18  6:57 AM  Result Value Ref Range   Hgb A1c MFr Bld 6.4 (H) 4.8 - 5.6 %    Comment: (NOTE) Pre diabetes:          5.7%-6.4% Diabetes:              >6.4% Glycemic control for   <7.0% adults with diabetes    Mean Plasma Glucose 136.98 mg/dL    Comment: Performed at [facility] [address], [place], [place] [address]  Lipid panel     Status: Abnormal   Collection Time: 12/13/18  6:57 AM  Result Value Ref Range   Cholesterol 131 0 - 200 mg/dL   Triglycerides 167 (H) <150 mg/dL   HDL 28 (L) >40 mg/dL   Total CHOL/HDL Ratio 4.7 RATIO   VLDL 33 0 - 40 mg/dL   LDL Cholesterol 70 0 - 99 mg/dL    Comment:        Total Cholesterol/HDL:CHD Risk Coronary Heart Disease Risk Table                     Men   Women  1/2 Average Risk   3.4   3.3  Average Risk       5.0   4.4  2 X Average Risk   9.6   7.1  3 X  Average Risk  23.4   11.0        Use the calculated Patient Ratio above and the CHD Risk Table to determine the patient's CHD Risk.        ATP III CLASSIFICATION (LDL):  <100     mg/dL   Optimal  100-129  mg/dL   Near or Above                    Optimal  130-159  mg/dL   Borderline  160-189  mg/dL   High  >190     mg/dL   Very High Performed at [facility], [address] [place], [place] [address]     Blood Alcohol level:  Lab Results  Component Value Date   ETH <10 12/12/2018   ETH 205 (H) 93/81/0175    Metabolic Disorder Labs:  Lab Results  Component Value Date   HGBA1C 6.4 (H) 12/13/2018   MPG 136.98 12/13/2018   MPG 116.89 05/05/2018   No results found for: PROLACTIN Lab Results  Component Value Date   CHOL 131 12/13/2018   TRIG 167 (H) 12/13/2018   HDL 28 (L) 12/13/2018   CHOLHDL 4.7 12/13/2018   VLDL 33 12/13/2018   LDLCALC 70 12/13/2018   LDLCALC 69 05/05/2018    Current Medications: Current Facility-Administered Medications  Medication Dose Route Frequency Provider Last Rate Last Dose  . albuterol (PROVENTIL HFA;VENTOLIN HFA) 108 (90 Base) MCG/ACT inhaler 2 puff  2 puff Inhalation Q6H PRN [name], MD   2 puff at 12/13/18 0813  . alum & mag hydroxide-simeth (MAALOX/MYLANTA) 200-200-20 MG/5ML suspension 30 mL  30 mL Oral Q4H PRN Nicolasa Ducking,  Steva Colder, MD      . aspirin chewable tablet 81 mg  81 mg Oral Daily [name], MD   81 mg at 12/13/18 2778  . atorvastatin (LIPITOR) tablet 40 mg  40 mg Oral q1800 [name], MD      . busPIRone (BUSPAR) tablet 10 mg  10 mg Oral TID [place] Lebeau T, MD      . cloNIDine (CATAPRES) tablet 0.1 mg  0.1 mg Oral BID PRN [name], MD      . escitalopram (LEXAPRO) tablet 10 mg  10 mg Oral Daily Maximino Cozzolino T, MD      . famotidine (PEPCID) tablet 40 mg  40 mg Oral QHS [name], MD      . hydrOXYzine (ATARAX/VISTARIL) tablet 50 mg  50 mg Oral Q6H PRN [name], MD   50 mg at 12/13/18 0811   . lisinopril (PRINIVIL,ZESTRIL) tablet 20 mg  20 mg Oral Daily [name], MD   20 mg at 12/13/18 0811  . LORazepam (ATIVAN) tablet 1 mg  1 mg Oral Q6H PRN [name], MD      . magnesium hydroxide (MILK OF MAGNESIA) suspension 30 mL  30 mL Oral Daily PRN [name], MD      . mometasone-formoterol (DULERA) 200-5 MCG/ACT inhaler 2 puff  2 puff Inhalation BID Bonnita Newby, [name], MD   2 puff at 12/13/18 1030  . multivitamin with minerals tablet 1 tablet  1 tablet Oral Daily [name], MD   1 tablet at 12/13/18 [phone]  . nicotine (NICODERM CQ - dosed in mg/24 hours) patch 21 mg  21 mg Transdermal Daily [name], MD   21 mg at 12/13/18 0811  . QUEtiapine (SEROQUEL XR) 24 hr tablet 300 mg  300 mg Oral QHS Shantai Tiedeman T, MD      . Sofosbuvir-Velpatasvir 400-100 MG TABS 1 tablet  1 tablet Oral [id number] [name], MD      . [name] ON 12/14/2018] thiamine (VITAMIN B-1) tablet 100 mg  100 mg Oral Daily [name], MD       PTA Medications: Medications Prior to Admission  Medication Sig Dispense Refill Last Dose  . ADVAIR DISKUS 250-50 MCG/DOSE AEPB INHALE 1 INHALATION INTO THE LUNGS EVERY 12 (TWELVE) HOURS  12   . albuterol (PROVENTIL HFA;VENTOLIN HFA) 108 (90 Base) MCG/ACT inhaler Inhale 2 puffs into the lungs every 6 (six) hours as needed for wheezing or shortness of breath. 1 Inhaler 2   . aspirin 81 MG EC tablet Take 1 tablet (81 mg total) by mouth daily. 30 tablet 1   . atorvastatin (LIPITOR) 40 MG tablet Take 1 tablet (40 mg total) by mouth daily at 6 PM. 30 tablet 1   . gabapentin (NEURONTIN) 300 MG capsule Take 1 capsule (300 mg total) by mouth 3 (three) times daily. 90 capsule 1   . hydrOXYzine (ATARAX/VISTARIL) 50 MG tablet Take 1 tablet (50 mg total) by mouth every 6 (six) hours as needed for anxiety. 60 tablet 1   . lisinopril (PRINIVIL,ZESTRIL) 20 MG tablet Take 1 tablet (20 mg total) by mouth daily. 30 tablet 1     Musculoskeletal: Strength & Muscle Tone: within  normal limits Gait & Station: normal Patient leans: N/A  Psychiatric Specialty Exam: Physical Exam  Nursing note and vitals reviewed. Constitutional: He appears well-developed and well-nourished.  HENT:  Head: Normocephalic and atraumatic.  Eyes: Pupils are equal,  round, and reactive to light. Conjunctivae are normal.  Neck: Normal range of motion.  Cardiovascular: Regular rhythm and normal heart sounds.  Respiratory: Effort normal.  GI: Soft.  Musculoskeletal: Normal range of motion.  Neurological: He is alert.  Skin: Skin is warm and dry.  Psychiatric: Judgment normal. His mood appears anxious. His speech is delayed. He is not agitated. Cognition and memory are normal. He exhibits a depressed mood. He expresses suicidal ideation. He expresses no suicidal plans.    Review of Systems  Constitutional: Negative.   HENT: Negative.   Eyes: Negative.   Respiratory: Negative.   Cardiovascular: Negative.   Gastrointestinal: Negative.   Musculoskeletal: Negative.   Skin: Negative.   Neurological: Negative.   Psychiatric/Behavioral: Positive for depression and suicidal ideas. Negative for hallucinations and substance abuse. The patient is nervous/anxious.     Blood pressure 104/75, pulse 87, temperature 97.7 F (36.5 C), temperature source Oral, resp. rate 17, height 6' (1.829 m), weight 127.9 kg, SpO2 95 %.Body mass index is 38.25 kg/m.  General Appearance: Disheveled  Eye Contact:  Fair  Speech:  Normal Rate  Volume:  Normal  Mood:  Anxious and Depressed  Affect:  Congruent  Thought Process:  Coherent  Orientation:  Full (Time, Place, and Person)  Thought Content:  Logical  Suicidal Thoughts:  Yes.  without intent/plan  Homicidal Thoughts:  No  Memory:  Immediate;   Fair Recent;   Fair Remote;   Fair  Judgement:  Fair  Insight:  Lacking  Psychomotor Activity:  Decreased  Concentration:  Concentration: Fair  Recall:  [employer] of Knowledge:  Fair  Language:  Fair   Akathisia:  No  Handed:  Right  AIMS (if indicated):     Assets:  Communication Skills Desire for Improvement Financial Resources/Insurance Housing Resilience Social Support  ADL's:  Intact  Cognition:  WNL  Sleep:       Treatment Plan Summary: Daily contact with patient to assess and evaluate symptoms and progress in treatment, Medication management and Plan Patient will be restarted on Seroquel extended release and BuSpar as well as Lexapro which she had been used previously with good benefit.  He will be engaged in individual and group therapy and continually reassessed.  Patient will receive support around his continued sobriety.  He will continue his medicine for hepatitis C as well as his usual medicines for hypertension and gastric reflux COPD.  Orders all completed patient agreeable.  Observation Level/Precautions:  15 minute checks  Laboratory:  HbAIC  Psychotherapy:    Medications:    Consultations:    Discharge Concerns:    Estimated LOS:  Other:     Physician Treatment Plan for Primary Diagnosis: Bipolar I disorder, most recent episode depressed with anxious distress ([place]) Long Term Goal(s): Improvement in symptoms so as ready for discharge  Short Term Goals: Ability to disclose and discuss suicidal ideas and Ability to demonstrate self-control will improve  Physician Treatment Plan for Secondary Diagnosis: Principal Problem:   Bipolar I disorder, most recent episode depressed with anxious distress ([place]) Active Problems:   HTN (hypertension)   Alcohol use disorder, moderate, dependence ([place])   Dyslipidemia   Major depression, recurrent, chronic ([place])   Hepatitis C  Long Term Goal(s): Improvement in symptoms so as ready for discharge  Short Term Goals: Ability to maintain clinical measurements within normal limits will improve and Compliance with prescribed medications will improve  I certify that inpatient services furnished can reasonably be expected to  improve the patient's condition.    Alethia Berthold, MD 12/23/201912:07 PM

## 2018-12-13 NOTE — Tx Team (Addendum)
Interdisciplinary Treatment and Diagnostic Plan Update  12/13/2018 Time of Session: 1030am [name] MRN: [id number]  Principal Diagnosis: Bipolar I disorder, most recent episode depressed with anxious distress ([name])  Secondary Diagnoses: Principal Problem:   Bipolar I disorder, most recent episode depressed with anxious distress ([name]) Active Problems:   HTN (hypertension)   Alcohol use disorder, moderate, dependence ([place])   Dyslipidemia   Major depression, recurrent, chronic ([name])   Hepatitis C   Current Medications:  Current Facility-Administered Medications  Medication Dose Route Frequency Provider Last Rate Last Dose  . albuterol (PROVENTIL HFA;VENTOLIN HFA) 108 (90 Base) MCG/ACT inhaler 2 puff  2 puff Inhalation Q6H PRN [name], MD   2 puff at 12/13/18 0813  . alum & mag hydroxide-simeth (MAALOX/MYLANTA) 200-200-20 MG/5ML suspension 30 mL  30 mL Oral Q4H PRN [name], MD      . aspirin chewable tablet 81 mg  81 mg Oral Daily [name], MD   81 mg at 12/13/18 0811  . atorvastatin (LIPITOR) tablet 40 mg  40 mg Oral q1800 [name], MD      . busPIRone (BUSPAR) tablet 10 mg  10 mg Oral TID [name], [name], MD   10 mg at 12/13/18 1252  . cloNIDine (CATAPRES) tablet 0.1 mg  0.1 mg Oral BID PRN [name], MD      . escitalopram (LEXAPRO) tablet 10 mg  10 mg Oral Daily [name], [name], MD   10 mg at 12/13/18 1252  . famotidine (PEPCID) tablet 40 mg  40 mg Oral QHS [name], MD      . hydrOXYzine (ATARAX/VISTARIL) tablet 50 mg  50 mg Oral Q6H PRN [name], MD   50 mg at 12/13/18 0811  . ibuprofen (ADVIL,MOTRIN) tablet 600 mg  600 mg Oral Q6H PRN [name], [name], MD   600 mg at 12/13/18 1252  . lisinopril (PRINIVIL,ZESTRIL) tablet 20 mg  20 mg Oral Daily [name], MD   20 mg at 12/13/18 0811  . LORazepam (ATIVAN) tablet 1 mg  1 mg Oral Q6H PRN [name], MD      . magnesium hydroxide (MILK OF MAGNESIA) suspension 30 mL  30 mL Oral  Daily PRN [name], MD      . mometasone-formoterol (DULERA) 200-5 MCG/ACT inhaler 2 puff  2 puff Inhalation BID [name], [name], MD   2 puff at 12/13/18 1030  . multivitamin with minerals tablet 1 tablet  1 tablet Oral Daily [name], MD   1 tablet at 12/13/18 [phone]  . nicotine (NICODERM CQ - dosed in mg/24 hours) patch 21 mg  21 mg Transdermal Daily [name], MD   21 mg at 12/13/18 0811  . QUEtiapine (SEROQUEL XR) 24 hr tablet 300 mg  300 mg Oral QHS [name], [name], MD      . Sofosbuvir-Velpatasvir 400-100 MG TABS 1 tablet  1 tablet Oral [id number] [name], MD      . [name] ON 12/14/2018] thiamine (VITAMIN B-1) tablet 100 mg  100 mg Oral Daily [name], MD       PTA Medications: Medications Prior to Admission  Medication Sig Dispense Refill Last Dose  . ADVAIR DISKUS 250-50 MCG/DOSE AEPB INHALE 1 INHALATION INTO THE LUNGS EVERY 12 (TWELVE) HOURS  12   . albuterol (PROVENTIL HFA;VENTOLIN HFA) 108 (90 Base) MCG/ACT inhaler Inhale 2 puffs into the lungs every 6 (six) hours  as needed for wheezing or shortness of breath. 1 Inhaler 2   . aspirin 81 MG EC tablet Take 1 tablet (81 mg total) by mouth daily. 30 tablet 1   . atorvastatin (LIPITOR) 40 MG tablet Take 1 tablet (40 mg total) by mouth daily at 6 PM. 30 tablet 1   . gabapentin (NEURONTIN) 300 MG capsule Take 1 capsule (300 mg total) by mouth 3 (three) times daily. 90 capsule 1   . hydrOXYzine (ATARAX/VISTARIL) 50 MG tablet Take 1 tablet (50 mg total) by mouth every 6 (six) hours as needed for anxiety. 60 tablet 1   . lisinopril (PRINIVIL,ZESTRIL) 20 MG tablet Take 1 tablet (20 mg total) by mouth daily. 30 tablet 1     Patient Stressors: Financial difficulties Health problems Medication change or noncompliance  Patient Strengths: Ability for insight Active sense of humor Average or above average intelligence Capable of independent living Communication skills General fund of knowledge Motivation for  treatment/growth Supportive family/friends  Treatment Modalities: Medication Management, Group therapy, Case management,  1 to 1 session with clinician, Psychoeducation, Recreational therapy.   Physician Treatment Plan for Primary Diagnosis: Bipolar I disorder, most recent episode depressed with anxious distress ([name]) Long Term Goal(s): Improvement in symptoms so as ready for discharge Improvement in symptoms so as ready for discharge   Short Term Goals: Ability to disclose and discuss suicidal ideas Ability to demonstrate self-control will improve Ability to maintain clinical measurements within normal limits will improve Compliance with prescribed medications will improve  Medication Management: Evaluate patient's response, side effects, and tolerance of medication regimen.  Therapeutic Interventions: 1 to 1 sessions, Unit Group sessions and Medication administration.  Evaluation of Outcomes: Not Met  Physician Treatment Plan for Secondary Diagnosis: Principal Problem:   Bipolar I disorder, most recent episode depressed with anxious distress ([name]) Active Problems:   HTN (hypertension)   Alcohol use disorder, moderate, dependence ([name])   Dyslipidemia   Major depression, recurrent, chronic ([name])   Hepatitis C  Long Term Goal(s): Improvement in symptoms so as ready for discharge Improvement in symptoms so as ready for discharge   Short Term Goals: Ability to disclose and discuss suicidal ideas Ability to demonstrate self-control will improve Ability to maintain clinical measurements within normal limits will improve Compliance with prescribed medications will improve     Medication Management: Evaluate patient's response, side effects, and tolerance of medication regimen.  Therapeutic Interventions: 1 to 1 sessions, Unit Group sessions and Medication administration.  Evaluation of Outcomes: Not Met   RN Treatment Plan for Primary Diagnosis: Bipolar I disorder, most recent  episode depressed with anxious distress ([name]) Long Term Goal(s): Knowledge of disease and therapeutic regimen to maintain health will improve  Short Term Goals: Ability to verbalize feelings will improve, Ability to identify and develop effective coping behaviors will improve and Compliance with prescribed medications will improve  Medication Management: RN will administer medications as ordered by provider, will assess and evaluate patient's response and provide education to patient for prescribed medication. RN will report any adverse and/or side effects to prescribing provider.  Therapeutic Interventions: 1 on 1 counseling sessions, Psychoeducation, Medication administration, Evaluate responses to treatment, Monitor vital signs and CBGs as ordered, Perform/monitor CIWA, COWS, AIMS and Fall Risk screenings as ordered, Perform wound care treatments as ordered.  Evaluation of Outcomes: Not Met   LCSW Treatment Plan for Primary Diagnosis: Bipolar I disorder, most recent episode depressed with anxious distress ([place]) Long Term Goal(s): Safe transition to appropriate next level  of care at discharge, Engage patient in therapeutic group addressing interpersonal concerns.  Short Term Goals: Engage patient in aftercare planning with referrals and resources  Therapeutic Interventions: Assess for all discharge needs, 1 to 1 time with Social worker, Explore available resources and support systems, Assess for adequacy in community support network, Educate family and significant other(s) on suicide prevention, Complete Psychosocial Assessment, Interpersonal group therapy.  Evaluation of Outcomes: Not Met   Progress in Treatment: Attending groups: No. Participating in groups: No. Taking medication as prescribed: Yes. Toleration medication: Yes. Family/Significant other contact made: No, will contact:  CSW will contact when pt gives consent Patient understands diagnosis: Yes. Discussing patient  identified problems/goals with staff: Yes. Medical problems stabilized or resolved: Yes. Denies suicidal/homicidal ideation: Yes. Issues/concerns per patient self-inventory: No. Other: NA  New problem(s) identified: No, Describe:  None reported  New Short Term/Long Term Goal(s): "Get back on medication and get christmas out of the way"  Patient Goals:  "Get back on medication and get christmas out of the way"  Discharge Plan or Barriers: Pt will return home and follow up with outpatient treatment  Reason for Continuation of Hospitalization: Medication stabilization  Estimated Length of Stay: 5-7 days  Recreational Therapy: Patient Stressors: N/A Patient Goal: Patient will engage in groups without prompting or encouragement from LRT x3 group sessions within 5 recreation therapy group sessions  Attendees: Patient: Ian [name] 12/13/2018 1:59 PM  Physician: Donato Heinz MD 12/13/2018 1:59 PM  Nursing:  12/13/2018 1:59 PM  RN Care Manager: 12/13/2018 1:59 PM  Social Worker: Sanjuana Kava LCSW 12/13/2018 1:59 PM  Recreational Therapist: Roanna Epley LRT 12/13/2018 1:59 PM  Other:  12/13/2018 1:59 PM  Other:  12/13/2018 1:59 PM  Other: 12/13/2018 1:59 PM    Scribe for Treatment Team: Yvette Rack, LCSW 12/13/2018 1:59 PM

## 2018-12-13 NOTE — Progress Notes (Signed)
Recreation Therapy Notes  INPATIENT RECREATION THERAPY ASSESSMENT  Patient Details Name: JAMYRON REDD MRN: [id number] DOB: September 04, 1961 Today's Date: 12/13/2018       Information Obtained From: Patient  Able to Participate in Assessment/Interview: Yes  Patient Presentation: Responsive  Reason for Admission (Per Patient): Med Non-Compliance, Other (Comments)(anxiety)  Patient Stressors: Other (Comment)(Health,Money)  Coping Skills:   (Nothing)  Leisure Interests (2+):  Individual - TV  Frequency of Recreation/Participation: Monthly  Awareness of Community Resources:     [employer]:     Current Use:    If no, Barriers?:    Expressed Interest in [place] of Residence:  Optician, dispensing  Patient Main Form of Transportation: Other (Comment)(Peer Support)  Patient Strengths:  I do not know  Patient Identified Areas of Improvement:  Stay on medication  Patient Goal for Hospitalization:  To get on medication and get christmas out of the way  Current SI (including self-harm):  No  Current HI:  No  Current AVH: No  Staff Intervention Plan: Group Attendance, Collaborate with Interdisciplinary Treatment Team  Consent to Intern Participation: N/A  Emani Morad 12/13/2018, 4:06 PM

## 2018-12-13 NOTE — Tx Team (Signed)
Initial Treatment Plan 12/13/2018 4:36 AM Ernie Hew XTA:[id number]    PATIENT STRESSORS: Financial difficulties Health problems Medication change or noncompliance   PATIENT STRENGTHS: Ability for insight Active sense of humor Average or above average intelligence Capable of independent living Communication skills General fund of knowledge Motivation for treatment/growth Supportive family/friends   PATIENT IDENTIFIED PROBLEMS: Suicidal thoughts 12/13/18  Depression 12/13/18  Medication non-compliance 12/13/18  Financial difficulties 12/13/18               DISCHARGE CRITERIA:  Ability to meet basic life and health needs Improved stabilization in mood, thinking, and/or behavior Medical problems require only outpatient monitoring Motivation to continue treatment in a less acute level of care Need for constant or close observation no longer present Safe-care adequate arrangements made Verbal commitment to aftercare and medication compliance  PRELIMINARY DISCHARGE PLAN: Outpatient therapy Return to previous living arrangement  PATIENT/FAMILY INVOLVEMENT: This treatment plan has been presented to and reviewed with the patient, Ian [name].  The patient has been given the opportunity to ask questions and make suggestions.  Reyes Ivan, RN 12/13/2018, 4:36 AM

## 2018-12-13 NOTE — Progress Notes (Signed)
D- Patient alert and oriented. Patient presents in a pleasant mood on assessment stating that he didn't get much rest last night because "they brought me down at 3am". Patient also stated that he had some lower back pain rating his pain a "8/10". MD was notified of patient's pain and orders were obtained. Patient rated his depression and anxiety a "9/10" stating that "that's why I came here" and "not knowing about my meds" is what's making him feel this way. Patient also endorses passive SI without a plan stating "I just want to get it over with", however, he does contract for safety. Patient denies HI/AVH, at this time.  A- Scheduled medications administered to patient, per MD orders. Support and encouragement provided.  Routine safety checks conducted every 15 minutes.  Patient informed to notify staff with problems or concerns.  R- No adverse drug reactions noted. Patient contracts for safety at this time. Patient compliant with medications and treatment plan. Patient receptive, calm, and cooperative. Patient interacts well with others on the unit.  Patient remains safe at this time.

## 2018-12-13 NOTE — Progress Notes (Signed)
D: Received patient from [facility] Emergency Department. Patient skin assessment completed with [place], RN, skin is intact, no contraband found. Pt. Was admitted under the services of, Dr. Bary Leriche.   Patient during the admissions process is pleasant and cooperative, able to participate actively with processing. Pt. Signed all required paperwork and was able to be compliant with admissions ecg testing with results reported to on call MD as well as vital signs.   Patient reports during admissions interviewing that he has been declining significantly in his physical health and does bring enough money in to care for himself and this has been causing major depression and thoughts of suicide. Pt. Endorses passive si, but contracts for safety. Pt. Reports high anxiety and depression over the last few months, but denies need for PRN medication currently. Pt. Reports he quit drinking three months ago, but still smokes. Pt. Is noticeably dyspneic upon exertion and reports he has been falling at home the last few months, but no reported or found injuries. Pt. Placed on high fall risk, given front wheel walker, and placed close to the nursing station for safety. Pt. Denies hi/avh. Denies current pain, but also expresses he has trouble walking, because of his lower back, "arthritis".     A: Patient oriented to unit/room/call light. Pt. Given fluids to drink and offered snack, but declined snack. Patient was encourage to participate in unit activities and continue with plan of care being put into place. Q x 15 minute observation checks were initiated for safety.   R: Patient is receptive to treatment plan being put into place and safety to be maintained on unit.

## 2018-12-13 NOTE — Plan of Care (Signed)
Patient has the ability to cope and has been present in the milieu as well as attending unit groups without any issues at this time. Patient has the ability to identify the resources available to meet his health-care needs, but has yet to voice anything to this Probation officer. Patient has verbalized understanding of and has been in compliance with his prescribed therapeutic regimen and all questions/concerns have been addressed and answered at this time. Patient rated his depression and anxiety a "9/10" stating that "that's why I came here" and "not knowing about my meds" is what's making him feel this way. Patient also endorses passive SI without a plan stating "I just want to get it over with", however, he does contract for safety. Patient denies HI/AVH, at this time. Patient remains safe on the unit at this time.  Problem: Coping: Goal: Coping ability will improve Outcome: Progressing   Problem: Health Behavior/Discharge Planning: Goal: Identification of resources available to assist in meeting health care needs will improve Outcome: Progressing   Problem: Medication: Goal: Compliance with prescribed medication regimen will improve Outcome: Progressing   Problem: Self-Concept: Goal: Ability to disclose and discuss suicidal ideas will improve Outcome: Progressing Goal: Will verbalize positive feelings about self Outcome: Progressing   Problem: Activity: Goal: Interest or engagement in leisure activities will improve Outcome: Progressing   Problem: Safety: Goal: Ability to disclose and discuss suicidal ideas will improve Outcome: Progressing   Problem: Self-Concept: Goal: Level of anxiety will decrease Outcome: Progressing

## 2018-12-13 NOTE — BHH Suicide Risk Assessment (Signed)
[facility] Admission Suicide Risk Assessment   Nursing information obtained from:  Review of record, Patient Demographic factors:  Male, Caucasian, Low socioeconomic status, Living alone, Unemployed Current Mental Status:  Self-harm thoughts Loss Factors:  Decline in physical health, Financial problems / change in socioeconomic status Historical Factors:  NA Risk Reduction Factors:  Sense of responsibility to family  Total Time spent with patient: 1 hour Principal Problem: Bipolar I disorder, most recent episode depressed with anxious distress ([place]) Diagnosis:  Principal Problem:   Bipolar I disorder, most recent episode depressed with anxious distress ([place]) Active Problems:   HTN (hypertension)   Alcohol use disorder, moderate, dependence ([place])   Dyslipidemia   Major depression, recurrent, chronic ([place])  Subjective Data: Patient admitted through the emergency room with a history of depression and substance abuse.  Has been off medicine and having passive suicidal thoughts.  On interview today the patient has passive suicidal thoughts but no intention or plan to act on it and is optimistic about improvement of symptoms once he is back on his medicine.  Does not report any psychotic symptoms.  Continued Clinical Symptoms:  Alcohol Use Disorder Identification Test Final Score (AUDIT): 6 The "Alcohol Use Disorders Identification Test", Guidelines for Use in Primary Care, Second Edition.  World Pharmacologist [facility]). Score between 0-7:  no or low risk or alcohol related problems. Score between 8-15:  moderate risk of alcohol related problems. Score between 16-19:  high risk of alcohol related problems. Score 20 or above:  warrants further diagnostic evaluation for alcohol dependence and treatment.   CLINICAL FACTORS:   Bipolar Disorder:   Bipolar II Alcohol/Substance Abuse/Dependencies   Musculoskeletal: Strength & Muscle Tone: within normal limits Gait & Station: normal Patient  leans: N/A  Psychiatric Specialty Exam: Physical Exam  Nursing note and vitals reviewed. Constitutional: He appears well-developed and well-nourished.  HENT:  Head: Normocephalic and atraumatic.  Eyes: Pupils are equal, round, and reactive to light. Conjunctivae are normal.  Neck: Normal range of motion.  Cardiovascular: Normal heart sounds.  Respiratory: Effort normal.  GI: Soft.  Musculoskeletal: Normal range of motion.  Neurological: He is alert.  Skin: Skin is warm and dry.  Psychiatric: His speech is normal and behavior is normal. Judgment and thought content normal. His mood appears anxious. He is not agitated. Cognition and memory are normal.    Review of Systems  Constitutional: Negative.   HENT: Negative.   Eyes: Negative.   Respiratory: Negative.   Cardiovascular: Negative.   Gastrointestinal: Negative.   Musculoskeletal: Negative.   Skin: Negative.   Neurological: Negative.   Psychiatric/Behavioral: Positive for depression and suicidal ideas. The patient is nervous/anxious and has insomnia.     Blood pressure 104/75, pulse 87, temperature 97.7 F (36.5 C), temperature source Oral, resp. rate 17, height 6' (1.829 m), weight 127.9 kg, SpO2 95 %.Body mass index is 38.25 kg/m.  General Appearance: Disheveled  Eye Contact:  Fair  Speech:  Clear and Coherent  Volume:  Normal  Mood:  Anxious and Depressed  Affect:  Congruent  Thought Process:  Coherent  Orientation:  Full (Time, Place, and Person)  Thought Content:  Logical  Suicidal Thoughts:  Yes.  without intent/plan  Homicidal Thoughts:  No  Memory:  Immediate;   Fair Recent;   Fair Remote;   Fair  Judgement:  Fair  Insight:  Fair  Psychomotor Activity:  Normal  Concentration:  Concentration: Fair  Recall:  [employer] of Knowledge:  Fair  Language:  Fair  Akathisia:  No  Handed:  Right  AIMS (if indicated):     Assets:  Communication Skills Desire for Improvement Financial  Resources/Insurance Housing Resilience  ADL's:  Intact  Cognition:  WNL  Sleep:         COGNITIVE FEATURES THAT CONTRIBUTE TO RISK:  Thought constriction (tunnel vision)    SUICIDE RISK:   Mild:  Suicidal ideation of limited frequency, intensity, duration, and specificity.  There are no identifiable plans, no associated intent, mild dysphoria and related symptoms, good self-control (both objective and subjective assessment), few other risk factors, and identifiable protective factors, including available and accessible social support.  PLAN OF CARE: Patient has some suicidal thoughts but no intent of acting on and is realistic about treatment.  Denies any psychotic symptoms or homicidal ideation.  Medications will be restarted and he will be engaged in regular group and individual therapy with ongoing reassessment of suicidality before discharged to appropriate outpatient care.  I certify that inpatient services furnished can reasonably be expected to improve the patient's condition.   Alethia Berthold, MD 12/13/2018, 12:01 PM

## 2018-12-13 NOTE — BH Assessment (Signed)
Patient is to be admitted to [facility] by Dr. Uvaldo Rising.  Attending Physician will be Dr. Bary Leriche.   Patient has been assigned to room 310, by [facility] Charge Nurse [place].   Intake Paper Work has been signed and placed on patient chart.  ER staff is aware of the admission:  Carlene ER Secretary    Dr. Quentin Cornwall, ER MD   Dominica Severin Patient's Nurse   [facility] Patient Access.

## 2018-12-13 NOTE — Progress Notes (Signed)
Recreation Therapy Notes  Date: 12/13/2018  Time: 9:30 am   Location: Craft room   Behavioral response: N/A   Intervention Topic: Goals  Discussion/Intervention: Patient did not attend group.   Clinical Observations/Feedback:  Patient did not attend group.   Giada Schoppe LRT/CTRS        Tyjae Shvartsman 12/13/2018 12:25 PM

## 2018-12-13 NOTE — ED Notes (Signed)
Hourly rounding reveals patient in room talking to TTS. No complaints, stable, in no acute distress. Q15 minute rounds and monitoring via [employer] to continue.

## 2018-12-14 NOTE — Progress Notes (Signed)
D - Patient was in the day room upon arrival to the unit. Patient was pleasant during assessment and medication administration. Patient denies SI/HI/AVH and pain. Patient stated his anxiety and depression were both 6/10. During assessment patient stated, "My day was ok, for being where I am".  A - Patient was compliant with medication administration per MD orders and procedures on the unit. Patient provided with education. Patient provided with support and encouragement. Patient informed to let staff know if there are any issues or problems on the unit.   R - Patient verbally contracts for safety with this Probation officer. Patient calm and cooperative on the unit. Patient seen interacting appropriately with peers and staff on the unit. Patient being monitored Q 15 minutes for safety per unit protocol. Patient remains safe on the unit at this time.

## 2018-12-14 NOTE — Progress Notes (Signed)
D: Pt. Continues to report/express passive suicidal ideations, but also expresses he is more optimistic at times about the future. Pt. Participation around the unit is improved, attending snack time and socializing with peers and staff often. Pt. Reports he can remain safe while on the unit, contracting for safety. Denies hi/avh. Pt. Affect is brighter overall. Pt. Encouraged to utilize front wheel walker for safety, but declines to do so, insisting he can walk and if he gets tired will stop to take a break. Pt. Chronic pain being reported being managed utilizing MD orders.   A: Q x 15 minute observation checks were completed for safety. Patient was provided with education, but needs reinforcement.  Patient was given/offered medications per orders. Patient  was encourage to attend groups, participate in unit activities and continue with plan of care. Pt. Chart and plans of care reviewed. Pt. Given support and encouragement.   R: Patient is complaint with medication and unit procedures. Pt. Attends snack time and eats a good size snack. Pt. Monitored per ciwa scoring.             Precautionary checks every 15 minutes for safety maintained, room free of safety hazards, patient sustains no injury or falls during this shift. Will endorse care to next shift.

## 2018-12-14 NOTE — [facility] Suicide Risk Assessment (Signed)
[facility] INPATIENT:  Family/Significant Other Suicide Prevention Education  Suicide Prevention Education:  Education Completed; Ian [name] [phone] has been identified by the patient as the family member/significant other with whom the patient will be residing, and identified as the person(s) who will aid the patient in the event of a mental health crisis (suicidal ideations/suicide attempt).  With written consent from the patient, the family member/significant other has been provided the following suicide prevention education, prior to the and/or following the discharge of the patient.  The suicide prevention education provided includes the following:  Suicide risk factors  Suicide prevention and interventions  National Suicide Hotline telephone number  [facility] assessment telephone number  [facility] Emergency Assistance [place] and/or Residential Mobile Crisis Unit telephone number  Request made of family/significant other to:  Remove weapons (e.g., guns, rifles, knives), all items previously/currently identified as safety concern.    Remove drugs/medications (over-the-counter, prescriptions, illicit drugs), all items previously/currently identified as a safety concern.  The family member/significant other verbalizes understanding of the suicide prevention education information provided.  The family member/significant other agrees to remove the items of safety concern listed above.  Ian [name] M 12/14/2018, 9:49 AM

## 2018-12-14 NOTE — Progress Notes (Signed)
Recreation Therapy Notes    Date: 12/14/2018  Time: 9:30 am   Location: Craft room   Behavioral response: N/A   Intervention Topic: Strengths  Discussion/Intervention: Patient did not attend group.   Clinical Observations/Feedback:  Patient did not attend group.   Tresten Pantoja LRT/CTRS         Devora Tortorella 12/14/2018 11:25 AM

## 2018-12-14 NOTE — Progress Notes (Signed)
D- Patient alert and oriented. Affect/mood. Patient presents in a pleasant mood on assessment stating that he slept "pretty good, better than last night" and had some complaints of lower back pain, rating his pain an "8/10". Patient did request pain medication from this writer. Patient rated his depression a "9/10" because of "this time of year" and states that he is anxious because of "my back". Patient denies SI, HI, AVH at this time. Patient had no stated goals for today "not really".   A- Scheduled medications administered to patient, per MD orders. Support and encouragement provided.  Routine safety checks conducted every 15 minutes. Patient informed to notify staff with problems or concerns.  R- No adverse drug reactions noted. Patient contracts for safety at this time. Patient compliant with medications and treatment plan. Patient receptive, calm, and cooperative. Patient interacts well with others on the unit.  Patient remains safe at this time.

## 2018-12-14 NOTE — Progress Notes (Signed)
[facility] MD Progress Note  12/14/2018 11:43 AM Ian [name]  MRN:  [id number] Subjective: Follow-up for this patient with a history of chronic mood symptoms.  Today he says he slept okay last night and his mood is feeling a little better.  Does not seem as anxious or agitated today.  Not reporting hopelessness.  Denies current suicidal ideation.  No psychosis.  He says he still subjectively feels anxious and is asking for more medicine for his anxiety.  He does not appear to be tremulous or having panic attacks.  Complains of his chronic back pain as well but admits that that is controlled somewhat by nonsteroidal pain medicines.  Patient is attending groups Principal Problem: Bipolar I disorder, most recent episode depressed with anxious distress ([place]) Diagnosis: Principal Problem:   Bipolar I disorder, most recent episode depressed with anxious distress ([place]) Active Problems:   HTN (hypertension)   Alcohol use disorder, moderate, dependence ([place])   Dyslipidemia   Major depression, recurrent, chronic ([place])   Hepatitis C  Total Time spent with patient: 30 minutes  Past Psychiatric History: Patient has a long history of depression and anxiety often comorbid with substance abuse although he appears to have stopped recent active substance abuse  Past Medical History:  Past Medical History:  Diagnosis Date  . Anxiety   . Barrett esophagus   . Cancer ([place])   . Coronary artery disease   . Depression   . GERD (gastroesophageal reflux disease)   . Hypertension   . Pre-diabetes   . Stroke [facility]) 2014   "mini-stroke" per patient    Past Surgical History:  Procedure Laterality Date  . ANGIOPLASTY    . APPENDECTOMY    . CARDIAC CATHETERIZATION    . CHOLECYSTECTOMY    . WRIST SURGERY Right    age 57   Family History:  Family History  Problem Relation Age of Onset  . Stroke Father   . Leukemia Father   . Breast cancer Sister    Family Psychiatric  History: See previous note Social  History:  Social History   Substance and Sexual Activity  Alcohol Use Yes   Comment: denies being a daily drinker but did have alcohol today     Social History   Substance and Sexual Activity  Drug Use Yes  . Types: Cocaine, Marijuana   Comment: used cocaine today; denies smoking marijuana for years    Social History   Socioeconomic History  . Marital status: Single    Spouse name: Not on file  . Number of children: Not on file  . Years of education: Not on file  . Highest education level: Not on file  Occupational History  . Not on file  Social Needs  . Financial resource strain: Not on file  . Food insecurity:    Worry: Not on file    Inability: Not on file  . Transportation needs:    Medical: Not on file    Non-medical: Not on file  Tobacco Use  . Smoking status: Current Every Day Smoker    Packs/day: 1.00    Types: Cigarettes  . Smokeless tobacco: Never Used  Substance and Sexual Activity  . Alcohol use: Yes    Comment: denies being a daily drinker but did have alcohol today  . Drug use: Yes    Types: Cocaine, Marijuana    Comment: used cocaine today; denies smoking marijuana for years  . Sexual activity: Not on file  Lifestyle  . Physical activity:  Days per week: Not on file    Minutes per session: Not on file  . Stress: Not on file  Relationships  . Social connections:    Talks on phone: Not on file    Gets together: Not on file    Attends religious service: Not on file    Active member of club or organization: Not on file    Attends meetings of clubs or organizations: Not on file    Relationship status: Not on file  Other Topics Concern  . Not on file  Social History Narrative  . Not on file   Additional Social History:                         Sleep: Fair  Appetite:  Fair  Current Medications: Current Facility-Administered Medications  Medication Dose Route Frequency Provider Last Rate Last Dose  . albuterol (PROVENTIL  HFA;VENTOLIN HFA) 108 (90 Base) MCG/ACT inhaler 2 puff  2 puff Inhalation Q6H PRN [name], MD   2 puff at 12/13/18 0813  . alum & mag hydroxide-simeth (MAALOX/MYLANTA) 200-200-20 MG/5ML suspension 30 mL  30 mL Oral Q4H PRN [name], MD      . aspirin chewable tablet 81 mg  81 mg Oral Daily [name], MD   81 mg at 12/14/18 0825  . atorvastatin (LIPITOR) tablet 40 mg  40 mg Oral q1800 [name], MD   40 mg at 12/13/18 1707  . busPIRone (BUSPAR) tablet 10 mg  10 mg Oral TID Doyce Stonehouse, [name], MD   10 mg at 12/14/18 0825  . cloNIDine (CATAPRES) tablet 0.1 mg  0.1 mg Oral BID PRN [name], MD      . escitalopram (LEXAPRO) tablet 10 mg  10 mg Oral Daily Shanira Tine T, MD   10 mg at 12/14/18 0825  . famotidine (PEPCID) tablet 40 mg  40 mg Oral QHS [name], MD   40 mg at 12/13/18 2141  . hydrOXYzine (ATARAX/VISTARIL) tablet 50 mg  50 mg Oral Q6H PRN [name], MD   50 mg at 12/13/18 0811  . ibuprofen (ADVIL,MOTRIN) tablet 600 mg  600 mg Oral Q6H PRN Hudson Majkowski, [name], MD   600 mg at 12/14/18 0825  . lisinopril (PRINIVIL,ZESTRIL) tablet 20 mg  20 mg Oral Daily [name], MD   20 mg at 12/14/18 0826  . LORazepam (ATIVAN) tablet 1 mg  1 mg Oral Q6H PRN [name], MD      . magnesium hydroxide (MILK OF MAGNESIA) suspension 30 mL  30 mL Oral Daily PRN [name], MD      . mometasone-formoterol [facility]) 200-5 MCG/ACT inhaler 2 puff  2 puff Inhalation BID Willson Lipa, [name], MD   2 puff at 12/14/18 [phone]  . multivitamin with minerals tablet 1 tablet  1 tablet Oral Daily [name], MD   1 tablet at 12/14/18 0825  . nicotine (NICODERM CQ - dosed in mg/24 hours) patch 21 mg  21 mg Transdermal Daily [name], MD   21 mg at 12/14/18 0827  . QUEtiapine (SEROQUEL XR) 24 hr tablet 300 mg  300 mg Oral QHS Carlton Buskey T, MD   300 mg at 12/13/18 2141  . Sofosbuvir-Velpatasvir 400-100 MG TABS 1 tablet  1 tablet Oral q1800 [name], MD   1 tablet at 12/13/18  1740  . thiamine (VITAMIN B-1) tablet 100 mg  100 mg Oral Daily [name], MD   100 mg at 12/14/18 0825    Lab Results:  Results for orders placed or performed during the hospital encounter of 12/13/18 (from the past 48 hour(s))  CBC     Status: Abnormal   Collection Time: 12/13/18  6:57 AM  Result Value Ref Range   WBC 13.6 (H) 4.0 - 10.5 K/uL   RBC 5.56 4.22 - 5.81 MIL/uL   Hemoglobin 16.5 13.0 - 17.0 g/dL   HCT 49.8 39.0 - 52.0 %   MCV 89.6 80.0 - 100.0 fL   MCH 29.7 26.0 - 34.0 pg   MCHC 33.1 30.0 - 36.0 g/dL   RDW 12.8 11.5 - 15.5 %   Platelets 178 150 - 400 K/uL   nRBC 0.0 0.0 - 0.2 %    Comment: Performed at [facility], [address] [place], [place] [address]  Hemoglobin A1c     Status: Abnormal   Collection Time: 12/13/18  6:57 AM  Result Value Ref Range   Hgb A1c MFr Bld 6.4 (H) 4.8 - 5.6 %    Comment: (NOTE) Pre diabetes:          5.7%-6.4% Diabetes:              >6.4% Glycemic control for   <7.0% adults with diabetes    Mean Plasma Glucose 136.98 mg/dL    Comment: Performed at [facility], [place] [address], [place], [place] [address]  Lipid panel     Status: Abnormal   Collection Time: 12/13/18  6:57 AM  Result Value Ref Range   Cholesterol 131 0 - 200 mg/dL   Triglycerides 167 (H) <150 mg/dL   HDL 28 (L) >40 mg/dL   Total CHOL/HDL Ratio 4.7 RATIO   VLDL 33 0 - 40 mg/dL   LDL Cholesterol 70 0 - 99 mg/dL    Comment:        Total Cholesterol/HDL:CHD Risk Coronary Heart Disease Risk Table                     Men   Women  1/2 Average Risk   3.4   3.3  Average Risk       5.0   4.4  2 X Average Risk   9.6   7.1  3 X Average Risk  23.4   11.0        Use the calculated Patient Ratio above and the CHD Risk Table to determine the patient's CHD Risk.        ATP III CLASSIFICATION (LDL):  <100     mg/dL   Optimal  100-129  mg/dL   Near or Above                    Optimal  130-159  mg/dL   Borderline  160-189  mg/dL   High  >190      mg/dL   Very High Performed at [facility], [address] [place],  [address]     Blood Alcohol level:  Lab Results  Component Value Date   ETH <10 12/12/2018   ETH 205 (H) 90/24/0973    Metabolic Disorder Labs: Lab Results  Component Value Date   HGBA1C 6.4 (H) 12/13/2018   MPG 136.98 12/13/2018   MPG 116.89 05/05/2018   No results found for: PROLACTIN Lab Results  Component Value Date   CHOL 131 12/13/2018   TRIG 167 (H) 12/13/2018  HDL 28 (L) 12/13/2018   CHOLHDL 4.7 12/13/2018   VLDL 33 12/13/2018   LDLCALC 70 12/13/2018   LDLCALC 69 05/05/2018    Physical Findings: AIMS:  , ,  ,  ,    CIWA:  CIWA-Ar Total: 0 COWS:     Musculoskeletal: Strength & Muscle Tone: within normal limits Gait & Station: normal Patient leans: N/A  Psychiatric Specialty Exam: Physical Exam  Nursing note and vitals reviewed. Constitutional: He appears well-developed and well-nourished.  HENT:  Head: Normocephalic and atraumatic.  Eyes: Pupils are equal, round, and reactive to light. Conjunctivae are normal.  Neck: Normal range of motion.  Cardiovascular: Regular rhythm and normal heart sounds.  Respiratory: Effort normal. No respiratory distress.  GI: Soft.  Musculoskeletal: Normal range of motion.  Neurological: He is alert.  Skin: Skin is warm and dry.  Psychiatric: His speech is delayed. He is slowed. Thought content is not paranoid. Cognition and memory are normal. He expresses impulsivity. He exhibits a depressed mood. He expresses no homicidal and no suicidal ideation.    Review of Systems  Constitutional: Negative.   HENT: Negative.   Eyes: Negative.   Respiratory: Negative.   Cardiovascular: Negative.   Gastrointestinal: Negative.   Musculoskeletal: Negative.   Skin: Negative.   Neurological: Negative.   Psychiatric/Behavioral: Positive for depression. Negative for hallucinations, substance abuse and suicidal ideas. The patient is  nervous/anxious.     Blood pressure 109/77, pulse 84, temperature 97.8 F (36.6 C), temperature source Oral, resp. rate 16, height 6' (1.829 m), weight 127.9 kg, SpO2 95 %.Body mass index is 38.25 kg/m.  General Appearance: Casual  Eye Contact:  Fair  Speech:  Slow  Volume:  Decreased  Mood:  Depressed  Affect:  Constricted  Thought Process:  Goal Directed  Orientation:  Full (Time, Place, and Person)  Thought Content:  Logical  Suicidal Thoughts:  No  Homicidal Thoughts:  No  Memory:  Immediate;   Fair Recent;   Fair Remote;   Fair  Judgement:  Fair  Insight:  Fair  Psychomotor Activity:  Decreased  Concentration:  Concentration: Fair  Recall:  [employer] of Knowledge:  Fair  Language:  Fair  Akathisia:  No  Handed:  Right  AIMS (if indicated):     Assets:  Desire for Improvement Housing Resilience  ADL's:  Intact  Cognition:  WNL  Sleep:  Number of Hours: 6.3     Treatment Plan Summary: Daily contact with patient to assess and evaluate symptoms and progress in treatment, Medication management and Plan Mood slightly improved although still subjectively depressed.  No psychosis.  Denies suicidal ideation.  Slept better last night.  Tolerating medicine.  Patient has chronic anxiety but admits that a lot of it is related to being around loud people on the unit.  BuSpar will be continued.  Patient is not a good candidate for benzodiazepine use.  Continue nonsteroidal medicine for chronic back pain.  Encourage group attendance.  Case reviewed with nursing.  Alethia Berthold, MD 12/14/2018, 11:43 AM

## 2018-12-14 NOTE — Plan of Care (Signed)
Patient has the ability to cope and has been present in the milieu and outside in the courtyard with staff and other members on the unit without any issues at this time. Patient has the ability to identify the resources available to meet his health-care needs, but has yet to voice anything to this Probation officer. Patient has verbalized understanding of and has been in compliance with his prescribed therapeutic regimen. Patient rated his depression and anxiety a "9/10" stating that "this time of year" has him depressed and rated his anxiety a "6/10" stating that "because of my back" is making him feel this way. Patient denies SI/HI/AVH, at this time. Patient remains safe on the unit at this time.  Problem: Coping: Goal: Coping ability will improve Outcome: Progressing   Problem: Health Behavior/Discharge Planning: Goal: Identification of resources available to assist in meeting health care needs will improve Outcome: Progressing   Problem: Medication: Goal: Compliance with prescribed medication regimen will improve Outcome: Progressing   Problem: Self-Concept: Goal: Ability to disclose and discuss suicidal ideas will improve Outcome: Progressing Goal: Will verbalize positive feelings about self Outcome: Progressing   Problem: Activity: Goal: Interest or engagement in leisure activities will improve Outcome: Progressing   Problem: Safety: Goal: Ability to disclose and discuss suicidal ideas will improve Outcome: Progressing   Problem: Self-Concept: Goal: Level of anxiety will decrease Outcome: Progressing

## 2018-12-14 NOTE — Plan of Care (Signed)
Patient was compliant with medication administration per MD orders. Support and encouragement provided to patient.   Problem: Coping: Goal: Coping ability will improve Outcome: Progressing   Problem: Medication: Goal: Compliance with prescribed medication regimen will improve Outcome: Progressing

## 2018-12-14 NOTE — Unmapped (Addendum)
-----   Message from [facility], [place] sent at 12/03/2018  1:35 PM EST -----  Regarding: lab result  Please let Douglas Schultz know that his HCV RNA level is detected at 76 IU/mL, which is a noted improvement from baseline level of 1,739,096 IU/mL. He needs to continue taking Epclusa as prescribed and avoid missing any doses. NO alcohol use. I want to repeat his HCV RNA quant level in four weeks. If you can please see what lab he wishes to go to that would be great and place the order. He needs to keep his follow up visit in ~ 7 weeks.     Dawn   -------------------------------------------------------------------------------------4:37 PM Attempted [name] number per the instructions, but the phone has restrictions and would not let me make the call. Called the patient and got the voice mail with name identification. Asked him to have labs done the second week in January. Asked him to call back and let me know [facility] facility or [facility]. I left my direct line.

## 2018-12-14 NOTE — Plan of Care (Signed)
Pt. Is complaint with medications. Pt. Continues to report passive suicidal ideations, but also expresses he is more optimistic at times about the future. Pt. Participation around the unit is improved, attending snack time and socializing with peers and staff often. Pt. Reports he can remain safe while on the unit, contracting for safety. Denies hi/avh.    Problem: Medication: Goal: Compliance with prescribed medication regimen will improve Outcome: Progressing   Problem: Self-Concept: Goal: Ability to disclose and discuss suicidal ideas will improve Outcome: Progressing   Problem: Activity: Goal: Interest or engagement in leisure activities will improve Outcome: Progressing   Problem: Safety: Goal: Ability to disclose and discuss suicidal ideas will improve Outcome: Progressing
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
## 2018-12-15 MED ORDER — NON FORMULARY: 1.0000 | Freq: Every day | Status: DC

## 2018-12-15 MED ORDER — MELATONIN 5 MG PO TABS
5.0000 mg | ORAL_TABLET | Freq: Every day | ORAL | Status: DC
  Administered 2018-12-15 – 2018-12-22 (×8): 5 mg via ORAL
  Filled 2018-12-15 (×9): qty 1

## 2018-12-15 NOTE — Plan of Care (Signed)
Data: Patient is appropriate and cooperative to assessment. Patient denies SI/HI/AVH. Patient has completed daily self inventory worksheet. Patient reports depression. Patient has complaints of back pain and a pain rating of 8/10. Patient reports fair sleep quality, appetite is good for the last 24 hours. Patient rates depression "7/10" , feelings of hopelessness "7/10" and anxiety "8/10" Patients goal for today is "get better."  Action:  Q x 15 minute observation checks were completed for safety. Patient was provided with education on medications. Patient was offered support and encouragement. Patient was given scheduled medications. Patient  was encourage to attend groups, participate in unit activities and continue with plan of care.    Response: Patient adherent to scheduled medications. Patient is receptive to treatment and safety maintained on unit.    Problem: Medication: Goal: Compliance with prescribed medication regimen will improve Outcome: Progressing   Problem: Self-Concept: Goal: Ability to disclose and discuss suicidal ideas will improve Outcome: Progressing

## 2018-12-15 NOTE — Progress Notes (Signed)
[facility] MD Progress Note  12/15/2018 12:25 PM BRACKEN MOFFA  MRN:  [id number]  Subjective:    Mr. Demuro is feeling better but affect still flat. Had difficulties folling asleep last night. Talks at length about hi struggle with alcoholism and proud to be sober long enough to get hepatitis treatment. Also about how embarrassed he feels about his hoarding. This prevents him from asking for help to clean up.    Principal Problem: Bipolar I disorder, most recent episode depressed with anxious distress ([place]) Diagnosis: Principal Problem:   Bipolar I disorder, most recent episode depressed with anxious distress ([place]) Active Problems:   HTN (hypertension)   Alcohol use disorder, moderate, dependence ([place])   Dyslipidemia   Major depression, recurrent, chronic ([place])   Hepatitis C  Total Time spent with patient: 20 minutes  Past Psychiatric History: depression, anxiety, alcoholism  Past Medical History:  Past Medical History:  Diagnosis Date  . Anxiety   . Barrett esophagus   . Cancer ([place])   . Coronary artery disease   . Depression   . GERD (gastroesophageal reflux disease)   . Hypertension   . Pre-diabetes   . Stroke [facility]) 2014   "mini-stroke" per patient    Past Surgical History:  Procedure Laterality Date  . ANGIOPLASTY    . APPENDECTOMY    . CARDIAC CATHETERIZATION    . CHOLECYSTECTOMY    . WRIST SURGERY Right    age 57   Family History:  Family History  Problem Relation Age of Onset  . Stroke Father   . Leukemia Father   . Breast cancer Sister    Family Psychiatric  History: See H&P Social History:  Social History   Substance and Sexual Activity  Alcohol Use Yes   Comment: denies being a daily drinker but did have alcohol today     Social History   Substance and Sexual Activity  Drug Use Yes  . Types: Cocaine, Marijuana   Comment: used cocaine today; denies smoking marijuana for years    Social History   Socioeconomic History  . Marital status: Single     Spouse name: Not on file  . Number of children: Not on file  . Years of education: Not on file  . Highest education level: Not on file  Occupational History  . Not on file  Social Needs  . Financial resource strain: Not on file  . Food insecurity:    Worry: Not on file    Inability: Not on file  . Transportation needs:    Medical: Not on file    Non-medical: Not on file  Tobacco Use  . Smoking status: Current Every Day Smoker    Packs/day: 1.00    Types: Cigarettes  . Smokeless tobacco: Never Used  Substance and Sexual Activity  . Alcohol use: Yes    Comment: denies being a daily drinker but did have alcohol today  . Drug use: Yes    Types: Cocaine, Marijuana    Comment: used cocaine today; denies smoking marijuana for years  . Sexual activity: Not on file  Lifestyle  . Physical activity:    Days per week: Not on file    Minutes per session: Not on file  . Stress: Not on file  Relationships  . Social connections:    Talks on phone: Not on file    Gets together: Not on file    Attends religious service: Not on file    Active member of club or organization:  Not on file    Attends meetings of clubs or organizations: Not on file    Relationship status: Not on file  Other Topics Concern  . Not on file  Social History Narrative  . Not on file   Additional Social History:                         Sleep: Poor  Appetite:  Fair  Current Medications: Current Facility-Administered Medications  Medication Dose Route Frequency Provider Last Rate Last Dose  . albuterol (PROVENTIL HFA;VENTOLIN HFA) 108 (90 Base) MCG/ACT inhaler 2 puff  2 puff Inhalation Q6H PRN [name], MD   2 puff at 12/13/18 0813  . alum & mag hydroxide-simeth (MAALOX/MYLANTA) 200-200-20 MG/5ML suspension 30 mL  30 mL Oral Q4H PRN [name], MD      . aspirin chewable tablet 81 mg  81 mg Oral Daily [name], MD   81 mg at 12/15/18 0814  . atorvastatin (LIPITOR) tablet 40 mg   40 mg Oral q1800 [name], MD   40 mg at 12/14/18 1709  . busPIRone (BUSPAR) tablet 10 mg  10 mg Oral TID [name], [name], MD   10 mg at 12/15/18 0814  . cloNIDine (CATAPRES) tablet 0.1 mg  0.1 mg Oral BID PRN [name], MD      . escitalopram (LEXAPRO) tablet 10 mg  10 mg Oral Daily [name], [name], MD   10 mg at 12/15/18 0814  . famotidine (PEPCID) tablet 40 mg  40 mg Oral QHS [name], MD   40 mg at 12/14/18 2136  . hydrOXYzine (ATARAX/VISTARIL) tablet 50 mg  50 mg Oral Q6H PRN [name], MD   50 mg at 12/14/18 1551  . ibuprofen (ADVIL,MOTRIN) tablet 600 mg  600 mg Oral Q6H PRN [name], [name], MD   600 mg at 12/15/18 0815  . lisinopril (PRINIVIL,ZESTRIL) tablet 20 mg  20 mg Oral Daily [name], MD   20 mg at 12/15/18 0814  . LORazepam (ATIVAN) tablet 1 mg  1 mg Oral Q6H PRN [name], MD      . magnesium hydroxide (MILK OF MAGNESIA) suspension 30 mL  30 mL Oral Daily PRN [name], MD      . mometasone-formoterol [facility]) 200-5 MCG/ACT inhaler 2 puff  2 puff Inhalation BID [name], [name], MD   2 puff at 12/15/18 0815  . multivitamin with minerals tablet 1 tablet  1 tablet Oral Daily [name], MD   1 tablet at 12/15/18 0814  . nicotine (NICODERM CQ - dosed in mg/24 hours) patch 21 mg  21 mg Transdermal Daily [name], MD   21 mg at 12/15/18 0814  . NON FORMULARY 1 tablet  1 tablet Oral QHS Orvan Papadakis B, MD      . QUEtiapine (SEROQUEL XR) 24 hr tablet 300 mg  300 mg Oral QHS [name], [name], MD   300 mg at 12/14/18 2136  . Sofosbuvir-Velpatasvir 400-100 MG TABS 1 tablet  1 tablet Oral q1800 [name], MD   1 tablet at 12/14/18 1821  . thiamine (VITAMIN B-1) tablet 100 mg  100 mg Oral Daily [name], MD   100 mg at 12/15/18 0355    Lab Results: No results found for this or any previous visit (from the past 48 hour(s)).  Blood Alcohol level:  Lab Results  Component  Value Date   ETH <10 12/12/2018   ETH 205 (H) 90/24/0973     Metabolic Disorder Labs: Lab Results  Component Value Date   HGBA1C 6.4 (H) 12/13/2018   MPG 136.98 12/13/2018   MPG 116.89 05/05/2018   No results found for: PROLACTIN Lab Results  Component Value Date   CHOL 131 12/13/2018   TRIG 167 (H) 12/13/2018   HDL 28 (L) 12/13/2018   CHOLHDL 4.7 12/13/2018   VLDL 33 12/13/2018   LDLCALC 70 12/13/2018   LDLCALC 69 05/05/2018    Physical Findings: AIMS:  , ,  ,  ,    CIWA:  CIWA-Ar Total: 0 COWS:     Musculoskeletal: Strength & Muscle Tone: within normal limits Gait & Station: normal Patient leans: N/A  Psychiatric Specialty Exam: Physical Exam  Nursing note and vitals reviewed. Psychiatric: His speech is normal and behavior is normal. Thought content normal. His affect is blunt. Cognition and memory are normal. He expresses impulsivity. He exhibits a depressed mood.    Review of Systems  Neurological: Negative.   Psychiatric/Behavioral: Positive for depression. The patient has insomnia.   All other systems reviewed and are negative.   Blood pressure 112/81, pulse 79, temperature 97.8 F (36.6 C), temperature source Oral, resp. rate 15, height 6' (1.829 m), weight 127.9 kg, SpO2 94 %.Body mass index is 38.25 kg/m.  General Appearance: Casual  Eye Contact:  Good  Speech:  Slow  Volume:  Decreased  Mood:  Depressed  Affect:  Blunt  Thought Process:  Goal Directed and Descriptions of Associations: Intact  Orientation:  Full (Time, Place, and Person)  Thought Content:  Rumination  Suicidal Thoughts:  No  Homicidal Thoughts:  No  Memory:  Immediate;   Fair Recent;   Fair Remote;   Fair  Judgement:  Poor  Insight:  Shallow  Psychomotor Activity:  Psychomotor Retardation  Concentration:  Concentration: Fair and Attention Span: Fair  Recall:  [employer] of Knowledge:  Fair  Language:  Fair  Akathisia:  No  Handed:  Right  AIMS (if indicated):     Assets:  Communication Skills Desire for Improvement Financial  Resources/Insurance Housing Physical Health Resilience  ADL's:  Intact  Cognition:  WNL  Sleep:  Number of Hours: 5.25     Treatment Plan Summary: Daily contact with patient to assess and evaluate symptoms and progress in treatment and Medication management   Mr. Sigmund is a 57 year old male with a history of depression admitted for suicidal ideation.  #Suicidal ideation -denies  #Depression -restarted on Seroquel XR 300 mg nightly -Lexapro 10 mg -BuSpar 10 mg TID  #Insomnia -add Melatonin  #Hepatitis -continue his regimen  #HTN -continue current regimen  #Smoking cessation -nicotine patch is available  #Disposition -discharge to home -follow up with [facility]     Orson Slick, MD 12/15/2018, 12:25 PM

## 2018-12-16 NOTE — Progress Notes (Signed)
D: Pt. Continues to endorse passive suicidal ideations, but no plan/intent. Pt. Verbally is able to contract for safety. Pt. Reports he can remain safe while on the unit. Pt. Continues to report high depression, but anxiety reportedly has been fluctuating, "better". Pt. Reports, "I am definitely better then when I first got here". Pt. Frequently observed the first half of the day very isolative and withdrawn to his room, but as the day progressed, observed out often watching tv in the day room.   A: Q x 15 minute observation checks were completed for safety. Patient was provided with education, but needs reinforcement.  Patient was given/offered medications per orders. Patient  was encourage to attend groups, participate in unit activities and continue with plan of care. Pt. Chart and plans of care reviewed. Pt. Given support and encouragement.   R: Patient is complaint with medication and unit procedures. Pt. Attends all meals appropriately, eating good. Pt. Given high falls risk safety education, but continues to decline need to utilize assistive equipment to get around the unit. Pt. Chronic pain being managed utilizing MD orders.              Precautionary checks every 15 minutes for safety maintained, room free of safety hazards, patient sustains no injury or falls during this shift. Will endorse care to next shift.

## 2018-12-16 NOTE — Progress Notes (Signed)
[facility] MD Progress Note  12/16/2018 2:48 PM Ian [name]  MRN:  [id number] Subjective: Follow-up for patient with bipolar disorder depressed.  Patient seen chart reviewed.  Patient tells me he is feeling worse today.  For some reason he is more depressed down and negative.  Patient has been feeling tired and still having difficulty sleeping.  Staring out the window today with passive suicidal thoughts.  Still taking care of basic ADLs and cooperative with medicine.  Cannot really put a finger on what might be making him feel worse.  No side effects of medicine or new physical complaints. Principal Problem: Bipolar I disorder, most recent episode depressed with anxious distress ([place]) Diagnosis: Principal Problem:   Bipolar I disorder, most recent episode depressed with anxious distress ([place]) Active Problems:   HTN (hypertension)   Alcohol use disorder, moderate, dependence ([place])   Dyslipidemia   Major depression, recurrent, chronic ([place])   Hepatitis C  Total Time spent with patient: 20 minutes  Past Psychiatric History: Patient has a history of severe recurrent depression and also substance abuse although that is under better control.  Past Medical History:  Past Medical History:  Diagnosis Date  . Anxiety   . Barrett esophagus   . Cancer ([place])   . Coronary artery disease   . Depression   . GERD (gastroesophageal reflux disease)   . Hypertension   . Pre-diabetes   . Stroke [facility]) 2014   "mini-stroke" per patient    Past Surgical History:  Procedure Laterality Date  . ANGIOPLASTY    . APPENDECTOMY    . CARDIAC CATHETERIZATION    . CHOLECYSTECTOMY    . WRIST SURGERY Right    age 53   Family History:  Family History  Problem Relation Age of Onset  . Stroke Father   . Leukemia Father   . Breast cancer Sister    Family Psychiatric  History: See previous note Social History:  Social History   Substance and Sexual Activity  Alcohol Use Yes   Comment: denies being a  daily drinker but did have alcohol today     Social History   Substance and Sexual Activity  Drug Use Yes  . Types: Cocaine, Marijuana   Comment: used cocaine today; denies smoking marijuana for years    Social History   Socioeconomic History  . Marital status: Single    Spouse name: Not on file  . Number of children: Not on file  . Years of education: Not on file  . Highest education level: Not on file  Occupational History  . Not on file  Social Needs  . Financial resource strain: Not on file  . Food insecurity:    Worry: Not on file    Inability: Not on file  . Transportation needs:    Medical: Not on file    Non-medical: Not on file  Tobacco Use  . Smoking status: Current Every Day Smoker    Packs/day: 1.00    Types: Cigarettes  . Smokeless tobacco: Never Used  Substance and Sexual Activity  . Alcohol use: Yes    Comment: denies being a daily drinker but did have alcohol today  . Drug use: Yes    Types: Cocaine, Marijuana    Comment: used cocaine today; denies smoking marijuana for years  . Sexual activity: Not on file  Lifestyle  . Physical activity:    Days per week: Not on file    Minutes per session: Not on file  . Stress:  Not on file  Relationships  . Social connections:    Talks on phone: Not on file    Gets together: Not on file    Attends religious service: Not on file    Active member of club or organization: Not on file    Attends meetings of clubs or organizations: Not on file    Relationship status: Not on file  Other Topics Concern  . Not on file  Social History Narrative  . Not on file   Additional Social History:                         Sleep: Fair  Appetite:  Fair  Current Medications: Current Facility-Administered Medications  Medication Dose Route Frequency Provider Last Rate Last Dose  . albuterol (PROVENTIL HFA;VENTOLIN HFA) 108 (90 Base) MCG/ACT inhaler 2 puff  2 puff Inhalation Q6H PRN [name], MD   2 puff  at 12/13/18 0813  . alum & mag hydroxide-simeth (MAALOX/MYLANTA) 200-200-20 MG/5ML suspension 30 mL  30 mL Oral Q4H PRN [name], MD      . aspirin chewable tablet 81 mg  81 mg Oral Daily [name], MD   81 mg at 12/16/18 0731  . atorvastatin (LIPITOR) tablet 40 mg  40 mg Oral q1800 [name], MD   40 mg at 12/15/18 1717  . busPIRone (BUSPAR) tablet 10 mg  10 mg Oral TID Malachai Schalk, [name], MD   10 mg at 12/16/18 1217  . cloNIDine (CATAPRES) tablet 0.1 mg  0.1 mg Oral BID PRN [name], MD   0.1 mg at 12/16/18 0731  . escitalopram (LEXAPRO) tablet 10 mg  10 mg Oral Daily Sultan Pargas, [name], MD   10 mg at 12/16/18 0730  . famotidine (PEPCID) tablet 40 mg  40 mg Oral QHS [name], MD   40 mg at 12/15/18 2123  . hydrOXYzine (ATARAX/VISTARIL) tablet 50 mg  50 mg Oral Q6H PRN [name], MD   50 mg at 12/15/18 1846  . ibuprofen (ADVIL,MOTRIN) tablet 600 mg  600 mg Oral Q6H PRN Takuya Lariccia, [name], MD   600 mg at 12/16/18 1517  . lisinopril (PRINIVIL,ZESTRIL) tablet 20 mg  20 mg Oral Daily [name], MD   20 mg at 12/16/18 0731  . magnesium hydroxide (MILK OF MAGNESIA) suspension 30 mL  30 mL Oral Daily PRN [name], MD      . Melatonin TABS 5 mg  5 mg Oral QHS Vauda Salvucci, [name], MD   5 mg at 12/15/18 2124  . mometasone-formoterol (DULERA) 200-5 MCG/ACT inhaler 2 puff  2 puff Inhalation BID Haliegh Khurana, [name], MD   2 puff at 12/16/18 0730  . multivitamin with minerals tablet 1 tablet  1 tablet Oral Daily [name], MD   1 tablet at 12/16/18 0730  . nicotine (NICODERM CQ - dosed in mg/24 hours) patch 21 mg  21 mg Transdermal Daily [name], MD   21 mg at 12/16/18 0730  . QUEtiapine (SEROQUEL XR) 24 hr tablet 300 mg  300 mg Oral QHS Alexina Niccoli T, MD   300 mg at 12/15/18 2124  . Sofosbuvir-Velpatasvir 400-100 MG TABS 1 tablet  1 tablet Oral q1800 [name], MD   1 tablet at 12/15/18 1844  . thiamine (VITAMIN B-1) tablet 100 mg  100 mg Oral Daily [name],  MD   100 mg at  12/16/18 0730    Lab Results: No results found for this or any previous visit (from the past 48 hour(s)).  Blood Alcohol level:  Lab Results  Component Value Date   ETH <10 12/12/2018   ETH 205 (H) 16/09/9603    Metabolic Disorder Labs: Lab Results  Component Value Date   HGBA1C 6.4 (H) 12/13/2018   MPG 136.98 12/13/2018   MPG 116.89 05/05/2018   No results found for: PROLACTIN Lab Results  Component Value Date   CHOL 131 12/13/2018   TRIG 167 (H) 12/13/2018   HDL 28 (L) 12/13/2018   CHOLHDL 4.7 12/13/2018   VLDL 33 12/13/2018   LDLCALC 70 12/13/2018   LDLCALC 69 05/05/2018    Physical Findings: AIMS:  , ,  ,  ,    CIWA:  CIWA-Ar Total: 0 COWS:     Musculoskeletal: Strength & Muscle Tone: within normal limits Gait & Station: normal Patient leans: N/A  Psychiatric Specialty Exam: Physical Exam  Nursing note and vitals reviewed. Constitutional: He appears well-developed and well-nourished.  HENT:  Head: Normocephalic and atraumatic.  Eyes: Pupils are equal, round, and reactive to light. Conjunctivae are normal.  Neck: Normal range of motion.  Cardiovascular: Regular rhythm and normal heart sounds.  Respiratory: Effort normal. No respiratory distress.  GI: Soft.  Musculoskeletal: Normal range of motion.  Neurological: He is alert.  Skin: Skin is warm and dry.  Psychiatric: His affect is blunt. His speech is delayed. He is slowed. Thought content is not paranoid. Cognition and memory are impaired. He expresses impulsivity. He exhibits a depressed mood. He expresses suicidal ideation. He expresses no suicidal plans.    Review of Systems  Constitutional: Negative.   HENT: Negative.   Eyes: Negative.   Respiratory: Negative.   Cardiovascular: Negative.   Gastrointestinal: Negative.   Musculoskeletal: Negative.   Skin: Negative.   Neurological: Negative.   Psychiatric/Behavioral: Positive for depression and suicidal ideas. Negative for  hallucinations, memory loss and substance abuse. The patient is nervous/anxious and has insomnia.     Blood pressure 112/90, pulse 82, temperature 98.1 F (36.7 C), resp. rate 17, height 6' (1.829 m), weight 127.9 kg, SpO2 96 %.Body mass index is 38.25 kg/m.  General Appearance: Casual  Eye Contact:  Good  Speech:  Clear and Coherent  Volume:  Decreased  Mood:  Depressed and Dysphoric  Affect:  Congruent  Thought Process:  Goal Directed  Orientation:  Full (Time, Place, and Person)  Thought Content:  Logical and Rumination  Suicidal Thoughts:  Yes.  without intent/plan  Homicidal Thoughts:  No  Memory:  Immediate;   Fair Recent;   Fair Remote;   Fair  Judgement:  Fair  Insight:  Fair  Psychomotor Activity:  Decreased  Concentration:  Concentration: Fair  Recall:  [employer] of Knowledge:  Fair  Language:  Fair  Akathisia:  No  Handed:  Right  AIMS (if indicated):     Assets:  Desire for Improvement Housing Resilience  ADL's:  Intact  Cognition:  Impaired,  Mild  Sleep:  Number of Hours: 6.5     Treatment Plan Summary: Daily contact with patient to assess and evaluate symptoms and progress in treatment, Medication management and Plan Patient is feeling more depressed today.  I gave him some space to talk to see if we could discover why he was more depressed but he was not able to articulate it.  Encourage patient to continue attending groups.  I reviewed his medicines and  would consider increasing his antidepressant but I want to get an EKG first before making any changes that could affect his arrhythmia.  No other change to medicine today.  Alethia Berthold, MD 12/16/2018, 2:48 PM

## 2018-12-16 NOTE — Plan of Care (Signed)
Patient has been progressing nicely in the unit, socializing with peers `, behaving normal rate depression and anxiety at 3/10, complaint with his medications, responding appropriately , thought is organized, contract for safety and denies any SI,HI,AVH at this time, expressed no concerns , mood and affect is positive  appetite is good and well hydrated with fluid and juices no distress 15 minute safety check in progress.   Problem: Coping: Goal: Coping ability will improve Outcome: Progressing   Problem: Health Behavior/Discharge Planning: Goal: Identification of resources available to assist in meeting health care needs will improve Outcome: Progressing   Problem: Medication: Goal: Compliance with prescribed medication regimen will improve Outcome: Progressing   Problem: Self-Concept: Goal: Ability to disclose and discuss suicidal ideas will improve Outcome: Progressing Goal: Will verbalize positive feelings about self Outcome: Progressing   Problem: Activity: Goal: Interest or engagement in leisure activities will improve Outcome: Progressing   Problem: Safety: Goal: Ability to disclose and discuss suicidal ideas will improve Outcome: Progressing   Problem: Self-Concept: Goal: Level of anxiety will decrease Outcome: Progressing

## 2018-12-16 NOTE — Plan of Care (Signed)
Pt. Is complaint with medications. Pt. Able to discuss suicidal thoughts, but continues to endorse passive suicidal ideations. Pt. Participation in activities is present. Pt. Coping continues to present lacking.    Problem: Coping: Goal: Coping ability will improve Outcome: Not Progressing   Problem: Medication: Goal: Compliance with prescribed medication regimen will improve Outcome: Progressing   Problem: Self-Concept: Goal: Ability to disclose and discuss suicidal ideas will improve Outcome: Progressing   Problem: Activity: Goal: Interest or engagement in leisure activities will improve Outcome: Progressing   Problem: Safety: Goal: Ability to disclose and discuss suicidal ideas will improve Outcome: Progressing

## 2018-12-17 MED ORDER — ZOLPIDEM TARTRATE 5 MG PO TABS
10.0000 mg | ORAL_TABLET | Freq: Every day | ORAL | Status: DC
  Administered 2018-12-17 – 2018-12-22 (×6): 10 mg via ORAL
  Filled 2018-12-17 (×6): qty 2

## 2018-12-17 NOTE — Plan of Care (Signed)
Patient has the ability to cope and has been present in the milieu and outside in the courtyard with staff and other members on the unit without any issues at this time. Patient has the ability to identify the available resources that can assist him in meeting his health-care needs.. Patient has verbalized understanding of and has been in compliance with his prescribed therapeutic regimen. Patient rated his depression an "45/10" stating that he's depressed "all the time, the holidays make it worse". Patient denied any anxiety stating to this writer "right now it's not bad, but yesterday it was bad". Patient also denied SI/HI/AVH, at this time. Patient remains safe on the unit at this time.  Problem: Coping: Goal: Coping ability will improve Outcome: Progressing   Problem: Health Behavior/Discharge Planning: Goal: Identification of resources available to assist in meeting health care needs will improve Outcome: Progressing   Problem: Medication: Goal: Compliance with prescribed medication regimen will improve Outcome: Progressing   Problem: Self-Concept: Goal: Ability to disclose and discuss suicidal ideas will improve Outcome: Progressing Goal: Will verbalize positive feelings about self Outcome: Progressing   Problem: Activity: Goal: Interest or engagement in leisure activities will improve Outcome: Progressing   Problem: Safety: Goal: Ability to disclose and discuss suicidal ideas will improve Outcome: Progressing   Problem: Self-Concept: Goal: Level of anxiety will decrease Outcome: Progressing

## 2018-12-17 NOTE — Progress Notes (Signed)
D- Patient alert and oriented. Patient presents in a pleasant mood on assessment stating that he "tossed and turned" again last night and had no major complaints to voice to this Probation officer. Patient continues to endorse chronic lower back pain, rating it a "6/10", and he did request pain medication from this Probation officer. Patient endorses passive SI, without a plan, stating that it's "at least once a day". Patient rates his depression an "8/10" stating that he has depression "all the time, holidays make it worse". Patient denies HI/AVH, as well as anxiety "right now it's not bad, yesterday it was bad".  A- Scheduled medications administered to patient, per MD orders. Support and encouragement provided.  Routine safety checks conducted every 15 minutes.  Patient informed to notify staff with problems or concerns.  R- No adverse drug reactions noted. Patient contracts for safety at this time. Patient compliant with medications and treatment plan. Patient receptive, calm, and cooperative. Patient interacts well with others on the unit.  Patient remains safe at this time.

## 2018-12-17 NOTE — Progress Notes (Addendum)
[facility] MD Progress Note  12/18/2018 2:01 PM Ian [name]  MRN:  [id number]  Subjective:    Mr. Magan still feels "sad" and unsure if he can handle his affairs at home. He is very unhappy in his hoarder's house and even more unhappy when ge gets out. He is unlikely to benefit from peer support.  He is not however suicidal, homicidal or psychotic. He tolerates medications well. I believe that this patient should be referred to APS at least for safety check.   Principal Problem: Bipolar I disorder, most recent episode depressed with anxious distress ([place]) Diagnosis: Principal Problem:   Bipolar I disorder, most recent episode depressed with anxious distress ([place]) Active Problems:   HTN (hypertension)   Alcohol use disorder, moderate, dependence ([place])   Dyslipidemia   Major depression, recurrent, chronic ([place])   Hepatitis C  Total Time spent with patient: 20 minutes  Past Psychiatric History: depression  Past Medical History:  Past Medical History:  Diagnosis Date  . Anxiety   . Barrett esophagus   . Cancer ([place])   . Coronary artery disease   . Depression   . GERD (gastroesophageal reflux disease)   . Hypertension   . Pre-diabetes   . Stroke [facility]) 2014   "mini-stroke" per patient    Past Surgical History:  Procedure Laterality Date  . ANGIOPLASTY    . APPENDECTOMY    . CARDIAC CATHETERIZATION    . CHOLECYSTECTOMY    . WRIST SURGERY Right    age 25   Family History:  Family History  Problem Relation Age of Onset  . Stroke Father   . Leukemia Father   . Breast cancer Sister    Family Psychiatric  History: none Social History:  Social History   Substance and Sexual Activity  Alcohol Use Yes   Comment: denies being a daily drinker but did have alcohol today     Social History   Substance and Sexual Activity  Drug Use Yes  . Types: Cocaine, Marijuana   Comment: used cocaine today; denies smoking marijuana for years    Social History   Socioeconomic  History  . Marital status: Single    Spouse name: Not on file  . Number of children: Not on file  . Years of education: Not on file  . Highest education level: Not on file  Occupational History  . Not on file  Social Needs  . Financial resource strain: Not on file  . Food insecurity:    Worry: Not on file    Inability: Not on file  . Transportation needs:    Medical: Not on file    Non-medical: Not on file  Tobacco Use  . Smoking status: Current Every Day Smoker    Packs/day: 1.00    Types: Cigarettes  . Smokeless tobacco: Never Used  Substance and Sexual Activity  . Alcohol use: Yes    Comment: denies being a daily drinker but did have alcohol today  . Drug use: Yes    Types: Cocaine, Marijuana    Comment: used cocaine today; denies smoking marijuana for years  . Sexual activity: Not on file  Lifestyle  . Physical activity:    Days per week: Not on file    Minutes per session: Not on file  . Stress: Not on file  Relationships  . Social connections:    Talks on phone: Not on file    Gets together: Not on file    Attends religious service: Not on  file    Active member of club or organization: Not on file    Attends meetings of clubs or organizations: Not on file    Relationship status: Not on file  Other Topics Concern  . Not on file  Social History Narrative  . Not on file   Additional Social History:                         Sleep: Fair  Appetite:  Fair  Current Medications: Current Facility-Administered Medications  Medication Dose Route Frequency Provider Last Rate Last Dose  . albuterol (PROVENTIL HFA;VENTOLIN HFA) 108 (90 Base) MCG/ACT inhaler 2 puff  2 puff Inhalation Q6H PRN [name], MD   2 puff at 12/13/18 0813  . alum & mag hydroxide-simeth (MAALOX/MYLANTA) 200-200-20 MG/5ML suspension 30 mL  30 mL Oral Q4H PRN [name], MD      . aspirin chewable tablet 81 mg  81 mg Oral Daily [name], MD   81 mg at 12/18/18 0263  .  atorvastatin (LIPITOR) tablet 40 mg  40 mg Oral q1800 [name], MD   40 mg at 12/17/18 1700  . busPIRone (BUSPAR) tablet 10 mg  10 mg Oral TID [name], [name], MD   10 mg at 12/18/18 1234  . cloNIDine (CATAPRES) tablet 0.1 mg  0.1 mg Oral BID PRN [name], MD   0.1 mg at 12/18/18 7858  . escitalopram (LEXAPRO) tablet 10 mg  10 mg Oral Daily [name], [name], MD   10 mg at 12/18/18 8502  . famotidine (PEPCID) tablet 40 mg  40 mg Oral QHS [name], MD   40 mg at 12/17/18 2143  . hydrOXYzine (ATARAX/VISTARIL) tablet 50 mg  50 mg Oral Q6H PRN [name], MD   50 mg at 12/18/18 0837  . ibuprofen (ADVIL,MOTRIN) tablet 600 mg  600 mg Oral Q6H PRN [name], [name], MD   600 mg at 12/18/18 0837  . lisinopril (PRINIVIL,ZESTRIL) tablet 20 mg  20 mg Oral Daily [name], MD   20 mg at 12/18/18 7741  . magnesium hydroxide (MILK OF MAGNESIA) suspension 30 mL  30 mL Oral Daily PRN [name], MD      . Melatonin TABS 5 mg  5 mg Oral QHS [name], [name], MD   5 mg at 12/17/18 2142  . mometasone-formoterol (DULERA) 200-5 MCG/ACT inhaler 2 puff  2 puff Inhalation BID [name], [name], MD   2 puff at 12/18/18 0836  . multivitamin with minerals tablet 1 tablet  1 tablet Oral Daily [name], MD   1 tablet at 12/18/18 [phone]  . nicotine (NICODERM CQ - dosed in mg/24 hours) patch 21 mg  21 mg Transdermal Daily [name], MD   21 mg at 12/18/18 0842  . QUEtiapine (SEROQUEL XR) 24 hr tablet 300 mg  300 mg Oral QHS [name], [name], MD   300 mg at 12/17/18 2142  . Sofosbuvir-Velpatasvir 400-100 MG TABS 1 tablet  1 tablet Oral q1800 [name], MD   1 tablet at 12/17/18 1859  . thiamine (VITAMIN B-1) tablet 100 mg  100 mg Oral Daily [name], MD   100 mg at 12/18/18 6767  . zolpidem (AMBIEN) tablet 10 mg  10 mg Oral QHS [name], [name], MD   10 mg at 12/17/18 2143    Lab Results: No results found for this  or any previous visit (from the past 48 hour(s)).  Blood Alcohol level:   Lab Results  Component Value Date   ETH <10 12/12/2018   ETH 205 (H) 02/40/9735    Metabolic Disorder Labs: Lab Results  Component Value Date   HGBA1C 6.4 (H) 12/13/2018   MPG 136.98 12/13/2018   MPG 116.89 05/05/2018   No results found for: PROLACTIN Lab Results  Component Value Date   CHOL 131 12/13/2018   TRIG 167 (H) 12/13/2018   HDL 28 (L) 12/13/2018   CHOLHDL 4.7 12/13/2018   VLDL 33 12/13/2018   LDLCALC 70 12/13/2018   LDLCALC 69 05/05/2018    Physical Findings: AIMS:  , ,  ,  ,    CIWA:  CIWA-Ar Total: 0 COWS:     Musculoskeletal: Strength & Muscle Tone: within normal limits Gait & Station: normal Patient leans: N/A  Psychiatric Specialty Exam: Physical Exam  Nursing note and vitals reviewed. Psychiatric: His speech is normal. His mood appears anxious. His affect is blunt. He is withdrawn. Cognition and memory are normal. He expresses impulsivity. He exhibits a depressed mood.    Review of Systems  Neurological: Negative.   Psychiatric/Behavioral: Positive for depression. The patient is nervous/anxious.   All other systems reviewed and are negative.   Blood pressure (!) 155/117, pulse 84, temperature 97.8 F (36.6 C), temperature source Oral, resp. rate 16, height 6' (1.829 m), weight 127.9 kg, SpO2 96 %.Body mass index is 38.25 kg/m.  General Appearance: Casual  Eye Contact:  Good  Speech:  Clear and Coherent  Volume:  Normal  Mood:  Anxious and Depressed  Affect:  Blunt  Thought Process:  Goal Directed and Descriptions of Associations: Intact  Orientation:  Full (Time, Place, and Person)  Thought Content:  WDL  Suicidal Thoughts:  No  Homicidal Thoughts:  No  Memory:  Immediate;   Fair Recent;   Fair Remote;   Fair  Judgement:  Poor  Insight:  Lacking  Psychomotor Activity:  Decreased  Concentration:  Concentration: Fair and Attention Span: Fair  Recall:  [employer] of Knowledge:  Fair  Language:  Fair  Akathisia:  No  Handed:  Right   AIMS (if indicated):     Assets:  Communication Skills Desire for Improvement Financial Resources/Insurance Housing Physical Health Resilience  ADL's:  Intact  Cognition:  WNL  Sleep:  Number of Hours: 6.15     Treatment Plan Summary: Daily contact with patient to assess and evaluate symptoms and progress in treatment and Medication management   Mr. Brummet is a 57 year old male with a history of depression admitted for suicidal ideation.  #Suicidal ideation, resolved   #Depression -restarted on Seroquel XR 300 mg nightly -Lexapro 10 mg -BuSpar 10 mg TID  #Insomnia -add Melatonin  #Hepatitis -continue his regimen  #HTN -continue current regimen  #Labs -lipid panel, TSH, A1C -EKG  #Smoking cessation -nicotine patch is available  #Disposition -discharge to home -follow up with [facility]  Orson Slick, MD 12/18/2018, 2:01 PM

## 2018-12-17 NOTE — Tx Team (Signed)
Interdisciplinary Treatment and Diagnostic Plan Update  12/17/2018 Time of Session: 0848am Ian [name] MRN: [id number]  Principal Diagnosis: Bipolar I disorder, most recent episode depressed with anxious distress ([place])  Secondary Diagnoses: Principal Problem:   Bipolar I disorder, most recent episode depressed with anxious distress ([place]) Active Problems:   HTN (hypertension)   Alcohol use disorder, moderate, dependence ([place])   Dyslipidemia   Major depression, recurrent, chronic ([place])   Hepatitis C   Current Medications:  Current Facility-Administered Medications  Medication Dose Route Frequency Provider Last Rate Last Dose  . albuterol (PROVENTIL HFA;VENTOLIN HFA) 108 (90 Base) MCG/ACT inhaler 2 puff  2 puff Inhalation Q6H PRN [name], MD   2 puff at 12/13/18 0813  . alum & mag hydroxide-simeth (MAALOX/MYLANTA) 200-200-20 MG/5ML suspension 30 mL  30 mL Oral Q4H PRN [name], MD      . aspirin chewable tablet 81 mg  81 mg Oral Daily [name], MD   81 mg at 12/17/18 0801  . atorvastatin (LIPITOR) tablet 40 mg  40 mg Oral q1800 [name], MD   40 mg at 12/16/18 1726  . busPIRone (BUSPAR) tablet 10 mg  10 mg Oral TID [name], [name], MD   10 mg at 12/17/18 0801  . cloNIDine (CATAPRES) tablet 0.1 mg  0.1 mg Oral BID PRN [name], MD   0.1 mg at 12/16/18 0731  . escitalopram (LEXAPRO) tablet 10 mg  10 mg Oral Daily [name], [name], MD   10 mg at 12/17/18 0759  . famotidine (PEPCID) tablet 40 mg  40 mg Oral QHS [name], MD   40 mg at 12/16/18 2122  . hydrOXYzine (ATARAX/VISTARIL) tablet 50 mg  50 mg Oral Q6H PRN [name], MD   50 mg at 12/16/18 1810  . ibuprofen (ADVIL,MOTRIN) tablet 600 mg  600 mg Oral Q6H PRN [name], [name], MD   600 mg at 12/17/18 0801  . lisinopril (PRINIVIL,ZESTRIL) tablet 20 mg  20 mg Oral Daily [name], MD   20 mg at 12/17/18 0801  . magnesium hydroxide (MILK OF MAGNESIA) suspension 30 mL  30 mL Oral Daily PRN  [name], MD      . Melatonin TABS 5 mg  5 mg Oral QHS [name], [name], MD   5 mg at 12/16/18 2121  . mometasone-formoterol (DULERA) 200-5 MCG/ACT inhaler 2 puff  2 puff Inhalation BID [name], [name], MD   2 puff at 12/17/18 0800  . multivitamin with minerals tablet 1 tablet  1 tablet Oral Daily [name], MD   1 tablet at 12/17/18 0759  . nicotine (NICODERM CQ - dosed in mg/24 hours) patch 21 mg  21 mg Transdermal Daily [name], MD   21 mg at 12/17/18 0801  . QUEtiapine (SEROQUEL XR) 24 hr tablet 300 mg  300 mg Oral QHS [name], [name], MD   300 mg at 12/16/18 2120  . Sofosbuvir-Velpatasvir 400-100 MG TABS 1 tablet  1 tablet Oral q1800 [name], MD   1 tablet at 12/16/18 1810  . thiamine (VITAMIN B-1) tablet 100 mg  100 mg Oral Daily [name], MD   100 mg at 12/17/18 0800   PTA Medications: Medications Prior to Admission  Medication Sig Dispense Refill Last Dose  . ADVAIR DISKUS 250-50 MCG/DOSE AEPB INHALE 1 INHALATION INTO THE LUNGS EVERY 12 (TWELVE) HOURS  12   . albuterol (PROVENTIL HFA;VENTOLIN  HFA) 108 (90 Base) MCG/ACT inhaler Inhale 2 puffs into the lungs every 6 (six) hours as needed for wheezing or shortness of breath. 1 Inhaler 2   . aspirin 81 MG EC tablet Take 1 tablet (81 mg total) by mouth daily. 30 tablet 1   . atorvastatin (LIPITOR) 40 MG tablet Take 1 tablet (40 mg total) by mouth daily at 6 PM. 30 tablet 1   . gabapentin (NEURONTIN) 300 MG capsule Take 1 capsule (300 mg total) by mouth 3 (three) times daily. 90 capsule 1   . hydrOXYzine (ATARAX/VISTARIL) 50 MG tablet Take 1 tablet (50 mg total) by mouth every 6 (six) hours as needed for anxiety. 60 tablet 1   . lisinopril (PRINIVIL,ZESTRIL) 20 MG tablet Take 1 tablet (20 mg total) by mouth daily. 30 tablet 1     Patient Stressors: Financial difficulties Health problems Medication change or noncompliance  Patient Strengths: Ability for insight Active sense of humor Average or above average  intelligence Capable of independent living Communication skills General fund of knowledge Motivation for treatment/growth Supportive family/friends  Treatment Modalities: Medication Management, Group therapy, Case management,  1 to 1 session with clinician, Psychoeducation, Recreational therapy.   Physician Treatment Plan for Primary Diagnosis: Bipolar I disorder, most recent episode depressed with anxious distress ([place]) Long Term Goal(s): Improvement in symptoms so as ready for discharge Improvement in symptoms so as ready for discharge   Short Term Goals: Ability to disclose and discuss suicidal ideas Ability to demonstrate self-control will improve Ability to maintain clinical measurements within normal limits will improve Compliance with prescribed medications will improve  Medication Management: Evaluate patient's response, side effects, and tolerance of medication regimen.  Therapeutic Interventions: 1 to 1 sessions, Unit Group sessions and Medication administration.  Evaluation of Outcomes: Progressing  Physician Treatment Plan for Secondary Diagnosis: Principal Problem:   Bipolar I disorder, most recent episode depressed with anxious distress ([place]) Active Problems:   HTN (hypertension)   Alcohol use disorder, moderate, dependence ([place])   Dyslipidemia   Major depression, recurrent, chronic ([place])   Hepatitis C  Long Term Goal(s): Improvement in symptoms so as ready for discharge Improvement in symptoms so as ready for discharge   Short Term Goals: Ability to disclose and discuss suicidal ideas Ability to demonstrate self-control will improve Ability to maintain clinical measurements within normal limits will improve Compliance with prescribed medications will improve     Medication Management: Evaluate patient's response, side effects, and tolerance of medication regimen.  Therapeutic Interventions: 1 to 1 sessions, Unit Group sessions and Medication  administration.  Evaluation of Outcomes: Progressing   RN Treatment Plan for Primary Diagnosis: Bipolar I disorder, most recent episode depressed with anxious distress ([place]) Long Term Goal(s): Knowledge of disease and therapeutic regimen to maintain health will improve  Short Term Goals: Ability to verbalize feelings will improve, Ability to identify and develop effective coping behaviors will improve and Compliance with prescribed medications will improve  Medication Management: RN will administer medications as ordered by provider, will assess and evaluate patient's response and provide education to patient for prescribed medication. RN will report any adverse and/or side effects to prescribing provider.  Therapeutic Interventions: 1 on 1 counseling sessions, Psychoeducation, Medication administration, Evaluate responses to treatment, Monitor vital signs and CBGs as ordered, Perform/monitor CIWA, COWS, AIMS and Fall Risk screenings as ordered, Perform wound care treatments as ordered.  Evaluation of Outcomes: Progressing   LCSW Treatment Plan for Primary Diagnosis: Bipolar I disorder, most recent episode depressed  with anxious distress ([place]) Long Term Goal(s): Safe transition to appropriate next level of care at discharge, Engage patient in therapeutic group addressing interpersonal concerns.  Short Term Goals: Engage patient in aftercare planning with referrals and resources  Therapeutic Interventions: Assess for all discharge needs, 1 to 1 time with Social worker, Explore available resources and support systems, Assess for adequacy in community support network, Educate family and significant other(s) on suicide prevention, Complete Psychosocial Assessment, Interpersonal group therapy.  Evaluation of Outcomes: Progressing   Progress in Treatment: Attending groups: No. Participating in groups: No. Taking medication as prescribed: Yes. Toleration medication: Yes. Family/Significant  other contact made: Yes, individual(s) contacted:  friend Patient understands diagnosis: Yes. Discussing patient identified problems/goals with staff: Yes. Medical problems stabilized or resolved: Yes. Denies suicidal/homicidal ideation: Yes. Issues/concerns per patient self-inventory: No. Other: NA  New problem(s) identified: No, Describe:  None reported  New Short Term/Long Term Goal(s): "Get back on medication and get christmas out of the way"  Patient Goals:  "Get back on medication and get christmas out of the way"  Discharge Plan or Barriers: Pt will return home and follow up with outpatient treatment  Reason for Continuation of Hospitalization: Medication stabilization  Estimated Length of Stay: 2-4 days  Recreational Therapy: Patient Stressors: N/A Patient Goal: Patient will engage in groups without prompting or encouragement from LRT x3 group sessions within 5 recreation therapy group sessions  Attendees: Patient: 12/17/2018   Physician: Dr. Weber Cooks, MD 12/17/2018   Nursing:  12/17/2018   RN Care Manager: 12/17/2018   Social Worker: Lurline Idol, LCSW 12/17/2018   Recreational Therapist:  12/17/2018   Other:  12/17/2018   Other:  12/17/2018   Other: 12/17/2018        Scribe for Treatment Team: Joanne Chars, LCSW 12/17/2018 12:12 PM

## 2018-12-17 NOTE — Progress Notes (Signed)
[facility] MD Progress Note  12/17/2018 5:57 PM Ian [name]  MRN:  [id number] Subjective: Patient says he is a little bit better today.  Still depressed still anxious.  Having trouble sleeping at night.  Denies any suicidal thoughts today. Principal Problem: Bipolar I disorder, most recent episode depressed with anxious distress ([place]) Diagnosis: Principal Problem:   Bipolar I disorder, most recent episode depressed with anxious distress ([place]) Active Problems:   HTN (hypertension)   Alcohol use disorder, moderate, dependence ([place])   Dyslipidemia   Major depression, recurrent, chronic ([place])   Hepatitis C  Total Time spent with patient: 20 minutes  Past Psychiatric History: History of recurrent severe depression past history of alcohol abuse currently under control  Past Medical History:  Past Medical History:  Diagnosis Date  . Anxiety   . Barrett esophagus   . Cancer ([place])   . Coronary artery disease   . Depression   . GERD (gastroesophageal reflux disease)   . Hypertension   . Pre-diabetes   . Stroke [name]) 2014   "mini-stroke" per patient    Past Surgical History:  Procedure Laterality Date  . ANGIOPLASTY    . APPENDECTOMY    . CARDIAC CATHETERIZATION    . CHOLECYSTECTOMY    . WRIST SURGERY Right    age 57   Family History:  Family History  Problem Relation Age of Onset  . Stroke Father   . Leukemia Father   . Breast cancer Sister    Family Psychiatric  History: See previous note Social History:  Social History   Substance and Sexual Activity  Alcohol Use Yes   Comment: denies being a daily drinker but did have alcohol today     Social History   Substance and Sexual Activity  Drug Use Yes  . Types: Cocaine, Marijuana   Comment: used cocaine today; denies smoking marijuana for years    Social History   Socioeconomic History  . Marital status: Single    Spouse name: Not on file  . Number of children: Not on file  . Years of education: Not on file  .  Highest education level: Not on file  Occupational History  . Not on file  Social Needs  . Financial resource strain: Not on file  . Food insecurity:    Worry: Not on file    Inability: Not on file  . Transportation needs:    Medical: Not on file    Non-medical: Not on file  Tobacco Use  . Smoking status: Current Every Day Smoker    Packs/day: 1.00    Types: Cigarettes  . Smokeless tobacco: Never Used  Substance and Sexual Activity  . Alcohol use: Yes    Comment: denies being a daily drinker but did have alcohol today  . Drug use: Yes    Types: Cocaine, Marijuana    Comment: used cocaine today; denies smoking marijuana for years  . Sexual activity: Not on file  Lifestyle  . Physical activity:    Days per week: Not on file    Minutes per session: Not on file  . Stress: Not on file  Relationships  . Social connections:    Talks on phone: Not on file    Gets together: Not on file    Attends religious service: Not on file    Active member of club or organization: Not on file    Attends meetings of clubs or organizations: Not on file    Relationship status: Not on file  Other Topics Concern  . Not on file  Social History Narrative  . Not on file   Additional Social History:                         Sleep: Poor  Appetite:  Fair  Current Medications: Current Facility-Administered Medications  Medication Dose Route Frequency Provider Last Rate Last Dose  . albuterol (PROVENTIL HFA;VENTOLIN HFA) 108 (90 Base) MCG/ACT inhaler 2 puff  2 puff Inhalation Q6H PRN [name], MD   2 puff at 12/13/18 0813  . alum & mag hydroxide-simeth (MAALOX/MYLANTA) 200-200-20 MG/5ML suspension 30 mL  30 mL Oral Q4H PRN [name], MD      . aspirin chewable tablet 81 mg  81 mg Oral Daily [name], MD   81 mg at 12/17/18 0801  . atorvastatin (LIPITOR) tablet 40 mg  40 mg Oral q1800 [name], MD   40 mg at 12/17/18 1700  . busPIRone (BUSPAR) tablet 10 mg  10 mg  Oral TID [name], [name], MD   10 mg at 12/17/18 1700  . cloNIDine (CATAPRES) tablet 0.1 mg  0.1 mg Oral BID PRN [name], MD   0.1 mg at 12/16/18 0731  . escitalopram (LEXAPRO) tablet 10 mg  10 mg Oral Daily [name], [name], MD   10 mg at 12/17/18 0759  . famotidine (PEPCID) tablet 40 mg  40 mg Oral QHS [name], MD   40 mg at 12/16/18 2122  . hydrOXYzine (ATARAX/VISTARIL) tablet 50 mg  50 mg Oral Q6H PRN [name], MD   50 mg at 12/16/18 1810  . ibuprofen (ADVIL,MOTRIN) tablet 600 mg  600 mg Oral Q6H PRN [name], [name], MD   600 mg at 12/17/18 1701  . lisinopril (PRINIVIL,ZESTRIL) tablet 20 mg  20 mg Oral Daily [name], MD   20 mg at 12/17/18 0801  . magnesium hydroxide (MILK OF MAGNESIA) suspension 30 mL  30 mL Oral Daily PRN [name], MD      . Melatonin TABS 5 mg  5 mg Oral QHS [name], [name], MD   5 mg at 12/16/18 2121  . mometasone-formoterol (DULERA) 200-5 MCG/ACT inhaler 2 puff  2 puff Inhalation BID [name], [name], MD   2 puff at 12/17/18 0800  . multivitamin with minerals tablet 1 tablet  1 tablet Oral Daily [name], MD   1 tablet at 12/17/18 0759  . nicotine (NICODERM CQ - dosed in mg/24 hours) patch 21 mg  21 mg Transdermal Daily [name], MD   21 mg at 12/17/18 0801  . QUEtiapine (SEROQUEL XR) 24 hr tablet 300 mg  300 mg Oral QHS [name], [name], MD   300 mg at 12/16/18 2120  . Sofosbuvir-Velpatasvir 400-100 MG TABS 1 tablet  1 tablet Oral q1800 [name], MD   1 tablet at 12/16/18 1810  . thiamine (VITAMIN B-1) tablet 100 mg  100 mg Oral Daily [name], MD   100 mg at 12/17/18 0800  . zolpidem (AMBIEN) tablet 10 mg  10 mg Oral QHS [name], [name], MD        Lab Results: No results found for this or any previous visit (from the past 48 hour(s)).  Blood Alcohol level:  Lab Results  Component Value Date   ETH <10 12/12/2018   ETH 205 (H) 25/04/3975    Metabolic Disorder Labs:  Lab Results  Component Value Date   HGBA1C  6.4 (H) 12/13/2018   MPG 136.98 12/13/2018   MPG 116.89 05/05/2018   No results found for: PROLACTIN Lab Results  Component Value Date   CHOL 131 12/13/2018   TRIG 167 (H) 12/13/2018   HDL 28 (L) 12/13/2018   CHOLHDL 4.7 12/13/2018   VLDL 33 12/13/2018   LDLCALC 70 12/13/2018   LDLCALC 69 05/05/2018    Physical Findings: AIMS:  , ,  ,  ,    CIWA:  CIWA-Ar Total: 0 COWS:     Musculoskeletal: Strength & Muscle Tone: within normal limits Gait & Station: normal Patient leans: N/A  Psychiatric Specialty Exam: Physical Exam  Nursing note and vitals reviewed. Constitutional: He appears well-developed and well-nourished.  HENT:  Head: Normocephalic and atraumatic.  Eyes: Pupils are equal, round, and reactive to light. Conjunctivae are normal.  Neck: Normal range of motion.  Cardiovascular: Regular rhythm and normal heart sounds.  Respiratory: Effort normal. No respiratory distress.  GI: Soft.  Musculoskeletal: Normal range of motion.  Neurological: He is alert.  Skin: Skin is warm and dry.  Psychiatric: Judgment normal. His speech is delayed. He is slowed. Cognition and memory are normal. He exhibits a depressed mood. He expresses no suicidal ideation.    Review of Systems  Constitutional: Negative.   HENT: Negative.   Eyes: Negative.   Respiratory: Negative.   Cardiovascular: Negative.   Gastrointestinal: Negative.   Musculoskeletal: Negative.   Skin: Negative.   Neurological: Negative.   Psychiatric/Behavioral: Negative for suicidal ideas. The patient has insomnia.     Blood pressure (!) 124/103, pulse 74, temperature (!) 97.5 F (36.4 C), temperature source Oral, resp. rate 16, height 6' (1.829 m), weight 127.9 kg, SpO2 97 %.Body mass index is 38.25 kg/m.  General Appearance: Casual  Eye Contact:  Minimal  Speech:  Slow  Volume:  Decreased  Mood:  Anxious and Depressed  Affect:  Flat  Thought Process:  Goal Directed  Orientation:  Full (Time, Place, and  Person)  Thought Content:  Logical  Suicidal Thoughts:  No  Homicidal Thoughts:  No  Memory:  Immediate;   Fair Recent;   Fair Remote;   Fair  Judgement:  Fair  Insight:  Fair  Psychomotor Activity:  Decreased  Concentration:  Concentration: Fair  Recall:  [employer] of Knowledge:  Fair  Language:  Fair  Akathisia:  No  Handed:  Right  AIMS (if indicated):     Assets:  Desire for Improvement Housing  ADL's:  Intact  Cognition:  WNL  Sleep:  Number of Hours: 6     Treatment Plan Summary: Daily contact with patient to assess and evaluate symptoms and progress in treatment, Medication management and Plan Tolerating antidepressant.  Mood slightly better.  He is asking if he can have something that will help him fall asleep.  I have added Ambien to his current regimen.  Hopefully this will not turn into something that would be abusable.  Meanwhile continue other medication encourage group participation.  Possible discharge by middle next week  Alethia Berthold, MD 12/17/2018, 5:57 PM

## 2018-12-17 NOTE — Progress Notes (Signed)
Patient alert and oriented no distress noted affect is flat but brightens upon approach, interacting appropriately with peers and staff. Patient's thoughts are organized, he rated depression a 5/10 ( 0 low -10 high). Patient was given support and encouraged to go to group. Patient attends group, very appropriate and polite. 15 minutes safety checks maintained will continue to monitor.

## 2018-12-18 NOTE — [facility] Group Notes (Signed)
LCSW Group Therapy Note  12/18/2018 1:15pm  Type of Therapy and Topic:  Group Therapy:  Cognitive Distortions  Participation Level:  Did Not Attend   Description of Group:    Patients in this group will be introduced to the topic of cognitive distortions.  Patients will identify and describe cognitive distortions, describe the feelings these distortions create for them.  Patients will identify one or more situations in their personal life where they have cognitively distorted thinking and will verbalize challenging this cognitive distortion through positive thinking skills.  Patients will practice the skill of using positive affirmations to challenge cognitive distortions using affirmation cards.    Therapeutic Goals:  1. Patient will identify two or more cognitive distortions they have used 2. Patient will identify one or more emotions that stem from use of a cognitive distortion 3. Patient will demonstrate use of a positive affirmation to counter a cognitive distortion through discussion and/or role play. 4. Patient will describe one way cognitive distortions can be detrimental to wellness   Summary of Patient Progress: Pt was invited to attend group but chose not to attend. CSW will continue to encourage pt to attend group throughout their admission.      Therapeutic Modalities:   Cognitive Behavioral Therapy Motivational Interviewing   Karlei Waldo  CUEBAS-COLON, LCSW 12/18/2018 11:39 AM

## 2018-12-18 NOTE — Progress Notes (Signed)
Clonidine 0.1 mg given BP 122/104, tolerated well no symptoms.

## 2018-12-18 NOTE — Plan of Care (Signed)
Problem: Coping: Goal: Coping ability will improve Outcome: Progressing   Problem: Medication: Goal: Compliance with prescribed medication regimen will improve Outcome: Progressing   Problem: Safety: Goal: Ability to disclose and discuss suicidal ideas will improve Outcome: Progressing DAR Note: Pt A & O X4. Denies SI, HI and AVH. Visible in milieu for majority of this shift. Presents with depressed mood but congruent affect. Interacts well with peers. Diastolic BP remains elevated 115-117, PRN Clonidine given per MD's order. Pt has been medication compliant. Denies adverse drug reactions. Scheduled and PRN medications given per order with verbal education and effects monitored. Pt encouraged to voice concerns, attend to ADLS and comply with current treatment regimen. Support and encouragement offered as well to pt this shift.  Pt receptive to care. Cooperative with unit routines. Remains safe on and off unit.

## 2018-12-18 NOTE — Progress Notes (Signed)
Patient is adjusting well,  education and encouragement is provided , encouraged to engage in group activities and to participate with peers in scheduled programs to improve his mental and emotional state, patient acknowledged information provided. Denies any SI,HI,AVH, contract for safety, compliant with medications, mood is mixed and affect is bright, no distress 15 minute is maintained.

## 2018-12-19 NOTE — Plan of Care (Signed)
Patient is calm and responsive, follows directions appropriately , seen in the milieu socializing with peers, voice no concerns , tolerating meals and care of ADLs, voiding with out issues, mood is good ,states depression at 6/10 and anxiety at 3/10, support is provided, compliant with his medications, contract for safety and denies any SI/HI/AVH, sleep is undisturbed.15 minute safety rounding is in progress.   Problem: Coping: Goal: Coping ability will improve Outcome: Progressing   Problem: Health Behavior/Discharge Planning: Goal: Identification of resources available to assist in meeting health care needs will improve Outcome: Progressing   Problem: Medication: Goal: Compliance with prescribed medication regimen will improve Outcome: Progressing   Problem: Self-Concept: Goal: Ability to disclose and discuss suicidal ideas will improve Outcome: Progressing Goal: Will verbalize positive feelings about self Outcome: Progressing   Problem: Activity: Goal: Interest or engagement in leisure activities will improve Outcome: Progressing   Problem: Safety: Goal: Ability to disclose and discuss suicidal ideas will improve Outcome: Progressing   Problem: Self-Concept: Goal: Level of anxiety will decrease Outcome: Progressing

## 2018-12-19 NOTE — Progress Notes (Signed)
D - Patient was in the day room upon arrival to the unit. Patient was pleasant during assessment and medication administration. Patient denies SI/HI/AVH and pain. Patient stated his anxiety was a 5/10 and his depression is a 7/10. During assessment patient stated his anxiety was because they might discharge me before I am ready to go.  A - patient was compliant with medication administration per MD orders and procedures on the unit. Patient provided with education. Patient provided support and encouragement. Patient informed to let staff know if there are any issues or problems on the unit.   R - Patient verbally contracts for safety with this Probation officer. Patient calm and cooperative on the unit. Patient seen interacting appropriately with peers and staff on the unit. Patient being monitored Q 15 minutes for safety per unit protocol. Patient remains safe at this time.

## 2018-12-19 NOTE — Progress Notes (Addendum)
D- Patient alert and oriented. Patient presents in a pleasant mood on assessment stating that he tossed and turned last night. Patient is aggravated with another patient on the unit, rating his anxiety a "10/10", in which he did request medication from this Probation officer. Patient continues to endorse passive SI without a plan, but he states to this writer that he feels safe on the unit. Patient denies depression as well as HI/AVH, at this time. Patient's goal for today is to "get through it".  A- Scheduled medications administered to patient, per MD orders. Support and encouragement provided.  Routine safety checks conducted every 15 minutes.  Patient informed to notify staff with problems or concerns.  R- No adverse drug reactions noted. Patient contracts for safety at this time. Patient compliant with medications and treatment plan. Patient receptive, calm, and cooperative. Patient interacts well with others on the unit.  Patient remains safe at this time.

## 2018-12-19 NOTE — Plan of Care (Signed)
Patient was compliant with medication administration per MD orders and procedures on the unit. Patient stated his anxiety was 5/10  Problem: Medication: Goal: Compliance with prescribed medication regimen will improve Outcome: Progressing   Problem: Self-Concept: Goal: Level of anxiety will decrease Outcome: Not Progressing

## 2018-12-19 NOTE — Plan of Care (Signed)
Patient has the ability to cope and has been present in the milieu and attended social work group for a little while, without any issues at this time. Patient has the ability to identify the available resources that can assist him in meeting his health-care needs.. Patient has verbalized understanding of and has been in compliance with his prescribed therapeutic regimen. Patient rated his depression an "77/10" stating that he's depressed "all the time, the holidays make it worse". Patient rated his anxiety a "10/10" complaining that another patient on the unit is irritating/aggravating him. Patient denied HI/AVH, but endorses passive SI without a plan, however, he does contract for safety with this Probation officer. Patient remains safe on the unit at this time.  Problem: Coping: Goal: Coping ability will improve Outcome: Progressing   Problem: Health Behavior/Discharge Planning: Goal: Identification of resources available to assist in meeting health care needs will improve Outcome: Progressing   Problem: Medication: Goal: Compliance with prescribed medication regimen will improve Outcome: Progressing   Problem: Self-Concept: Goal: Ability to disclose and discuss suicidal ideas will improve Outcome: Progressing Goal: Will verbalize positive feelings about self Outcome: Progressing   Problem: Activity: Goal: Interest or engagement in leisure activities will improve Outcome: Progressing   Problem: Safety: Goal: Ability to disclose and discuss suicidal ideas will improve Outcome: Progressing   Problem: Self-Concept: Goal: Level of anxiety will decrease Outcome: Progressing

## 2018-12-19 NOTE — Progress Notes (Signed)
[facility] MD Progress Note  12/19/2018 3:55 PM Ian [name]  MRN:  [id number]  Subjective:   [name] feels sad but no longer suicidal He still complains of insomnia. No other issues.  Principal Problem: Bipolar I disorder, most recent episode depressed with anxious distress ([place]) Diagnosis: Principal Problem:   Bipolar I disorder, most recent episode depressed with anxious distress ([place]) Active Problems:   HTN (hypertension)   Alcohol use disorder, moderate, dependence ([place])   Dyslipidemia   Major depression, recurrent, chronic ([place])   Hepatitis C  Total Time spent with patient: 15 minutes  Past Psychiatric History: depression  Past Medical History:  Past Medical History:  Diagnosis Date  . Anxiety   . Barrett esophagus   . Cancer ([place])   . Coronary artery disease   . Depression   . GERD (gastroesophageal reflux disease)   . Hypertension   . Pre-diabetes   . Stroke [facility]) 2014   "mini-stroke" per patient    Past Surgical History:  Procedure Laterality Date  . ANGIOPLASTY    . APPENDECTOMY    . CARDIAC CATHETERIZATION    . CHOLECYSTECTOMY    . WRIST SURGERY Right    age 57   Family History:  Family History  Problem Relation Age of Onset  . Stroke Father   . Leukemia Father   . Breast cancer Sister    Family Psychiatric  History: none Social History:  Social History   Substance and Sexual Activity  Alcohol Use Yes   Comment: denies being a daily drinker but did have alcohol today     Social History   Substance and Sexual Activity  Drug Use Yes  . Types: Cocaine, Marijuana   Comment: used cocaine today; denies smoking marijuana for years    Social History   Socioeconomic History  . Marital status: Single    Spouse name: Not on file  . Number of children: Not on file  . Years of education: Not on file  . Highest education level: Not on file  Occupational History  . Not on file  Social Needs  . Financial resource strain: Not on file  . Food  insecurity:    Worry: Not on file    Inability: Not on file  . Transportation needs:    Medical: Not on file    Non-medical: Not on file  Tobacco Use  . Smoking status: Current Every Day Smoker    Packs/day: 1.00    Types: Cigarettes  . Smokeless tobacco: Never Used  Substance and Sexual Activity  . Alcohol use: Yes    Comment: denies being a daily drinker but did have alcohol today  . Drug use: Yes    Types: Cocaine, Marijuana    Comment: used cocaine today; denies smoking marijuana for years  . Sexual activity: Not on file  Lifestyle  . Physical activity:    Days per week: Not on file    Minutes per session: Not on file  . Stress: Not on file  Relationships  . Social connections:    Talks on phone: Not on file    Gets together: Not on file    Attends religious service: Not on file    Active member of club or organization: Not on file    Attends meetings of clubs or organizations: Not on file    Relationship status: Not on file  Other Topics Concern  . Not on file  Social History Narrative  . Not on file  Additional Social History:                         Sleep: Poor  Appetite:  Fair  Current Medications: Current Facility-Administered Medications  Medication Dose Route Frequency Provider Last Rate Last Dose  . albuterol (PROVENTIL HFA;VENTOLIN HFA) 108 (90 Base) MCG/ACT inhaler 2 puff  2 puff Inhalation Q6H PRN [name], MD   2 puff at 12/13/18 0813  . alum & mag hydroxide-simeth (MAALOX/MYLANTA) 200-200-20 MG/5ML suspension 30 mL  30 mL Oral Q4H PRN [name], MD      . aspirin chewable tablet 81 mg  81 mg Oral Daily [name], MD   81 mg at 12/19/18 0843  . atorvastatin (LIPITOR) tablet 40 mg  40 mg Oral q1800 [name], MD   40 mg at 12/18/18 1713  . busPIRone (BUSPAR) tablet 10 mg  10 mg Oral TID [name], [name], MD   10 mg at 12/19/18 1117  . cloNIDine (CATAPRES) tablet 0.1 mg  0.1 mg Oral BID PRN [name], MD   0.1 mg at  12/18/18 1750  . escitalopram (LEXAPRO) tablet 10 mg  10 mg Oral Daily [name], [name], MD   10 mg at 12/19/18 0843  . famotidine (PEPCID) tablet 40 mg  40 mg Oral QHS [name], MD   40 mg at 12/18/18 2153  . hydrOXYzine (ATARAX/VISTARIL) tablet 50 mg  50 mg Oral Q6H PRN [name], MD   50 mg at 12/19/18 0842  . ibuprofen (ADVIL,MOTRIN) tablet 600 mg  600 mg Oral Q6H PRN [name], [name], MD   600 mg at 12/19/18 0842  . lisinopril (PRINIVIL,ZESTRIL) tablet 20 mg  20 mg Oral Daily [name], MD   20 mg at 12/19/18 0843  . magnesium hydroxide (MILK OF MAGNESIA) suspension 30 mL  30 mL Oral Daily PRN [name], MD      . Melatonin TABS 5 mg  5 mg Oral QHS [name], [name], MD   5 mg at 12/18/18 2153  . mometasone-formoterol (DULERA) 200-5 MCG/ACT inhaler 2 puff  2 puff Inhalation BID [name], [name], MD   2 puff at 12/19/18 [phone]  . multivitamin with minerals tablet 1 tablet  1 tablet Oral Daily [name], MD   1 tablet at 12/19/18 0843  . nicotine (NICODERM CQ - dosed in mg/24 hours) patch 21 mg  21 mg Transdermal Daily [name], MD   21 mg at 12/19/18 0844  . QUEtiapine (SEROQUEL XR) 24 hr tablet 300 mg  300 mg Oral QHS [name], [name], MD   300 mg at 12/18/18 2153  . Sofosbuvir-Velpatasvir 400-100 MG TABS 1 tablet  1 tablet Oral q1800 [name], MD   1 tablet at 12/18/18 1743  . thiamine (VITAMIN B-1) tablet 100 mg  100 mg Oral Daily [name], MD   100 mg at 12/19/18 0843  . zolpidem (AMBIEN) tablet 10 mg  10 mg Oral QHS [name], [name], MD   10 mg at 12/18/18 2153    Lab Results: No results found for this or any previous visit (from the past 6 hour(s)).  Blood Alcohol level:  Lab Results  Component Value Date   ETH <10 12/12/2018   ETH 205 (H) 93/90/3009    Metabolic Disorder Labs: Lab Results  Component Value Date   HGBA1C 6.4 (H) 12/13/2018   MPG 136.98  12/13/2018   MPG 116.89 05/05/2018   No results found for: PROLACTIN Lab Results   Component Value Date   CHOL 131 12/13/2018   TRIG 167 (H) 12/13/2018   HDL 28 (L) 12/13/2018   CHOLHDL 4.7 12/13/2018   VLDL 33 12/13/2018   LDLCALC 70 12/13/2018   LDLCALC 69 05/05/2018    Physical Findings: AIMS:  , ,  ,  ,    CIWA:  CIWA-Ar Total: 0 COWS:     Musculoskeletal: Strength & Muscle Tone: within normal limits Gait & Station: normal Patient leans: N/A  Psychiatric Specialty Exam: Physical Exam  Nursing note and vitals reviewed. Psychiatric: His speech is normal and behavior is normal. Judgment and thought content normal. Cognition and memory are normal. He exhibits a depressed mood.    Review of Systems  Neurological: Negative.   Psychiatric/Behavioral: Positive for depression.  All other systems reviewed and are negative.   Blood pressure 119/76, pulse 73, temperature 97.8 F (36.6 C), temperature source Oral, resp. rate 18, height 6' (1.829 m), weight 127.9 kg, SpO2 98 %.Body mass index is 38.25 kg/m.  General Appearance: Casual  Eye Contact:  Good  Speech:  Clear and Coherent  Volume:  Normal  Mood:  Depressed  Affect:  Appropriate  Thought Process:  Goal Directed and Descriptions of Associations: Intact  Orientation:  Full (Time, Place, and Person)  Thought Content:  WDL  Suicidal Thoughts:  No  Homicidal Thoughts:  No  Memory:  Immediate;   Fair Recent;   Fair Remote;   Fair  Judgement:  Fair  Insight:  Lacking  Psychomotor Activity:  Normal  Concentration:  Concentration: Fair and Attention Span: Fair  Recall:  [employer] of Knowledge:  Fair  Language:  Fair  Akathisia:  No  Handed:  Right  AIMS (if indicated):     Assets:  Communication Skills Desire for Improvement Financial Resources/Insurance Housing Physical Health Resilience  ADL's:  Intact  Cognition:  WNL  Sleep:  Number of Hours: 6.3     Treatment Plan Summary: Daily contact with patient to assess and evaluate symptoms and progress in treatment and Medication  management   Mr. Moga is a 57 year old male with a history of depression admitted for suicidal ideation.  #Suicidal ideation, resolved   #Depression -restarted on Seroquel XR 300 mg nightly -Lexapro 10 mg -BuSpar 10 mg TID  #Insomnia -add Melatonin  #Hepatitis -continue his regimen  #HTN -continue current regimen  #Labs -lipid panel, TSH, A1C -EKG  #Smoking cessation -nicotine patch is available  #Disposition -discharge to home -follow up with [facility]  Orson Slick, MD 12/19/2018, 3:55 PM

## 2018-12-19 NOTE — [facility] Group Notes (Signed)
LCSW Group Therapy Note 12/19/2018 1:15pm  Type of Therapy and Topic: Group Therapy: Feelings Around Returning Home & Establishing a Supportive Framework and Supporting Oneself When Supports Not Available  Participation Level: Active  Description of Group:  Patients first processed thoughts and feelings about upcoming discharge. These included fears of upcoming changes, lack of change, new living environments, judgements and expectations from others and overall stigma of mental health issues. The group then discussed the definition of a supportive framework, what that looks and feels like, and how do to discern it from an unhealthy non-supportive network. The group identified different types of supports as well as what to do when your family/friends are less than helpful or unavailable  Therapeutic Goals  1. Patient will identify one healthy supportive network that they can use at discharge. 2. Patient will identify one factor of a supportive framework and how to tell it from an unhealthy network. 3. Patient able to identify one coping skill to use when they do not have positive supports from others. 4. Patient will demonstrate ability to communicate their needs through discussion and/or role plays.  Summary of Patient Progress:  The patient reported he feels "alright".Pt engaged during group session. As patients processed their anxiety about discharge and described healthy supports patient shared he is not ready to be discharge.  Patients identified at least one self-care tool they were willing to use after discharge.   Therapeutic Modalities Cognitive Behavioral Therapy Motivational Interviewing   Mindee Robledo  CUEBAS-COLON, LCSW 12/19/2018 8:52 AM

## 2018-12-20 NOTE — Progress Notes (Signed)
Recreation Therapy Notes  Date: 12/20/2018  Time: 9:30 am   Location: Craft room   Behavioral response: N/A   Intervention Topic: Coping skills  Discussion/Intervention: Patient did not attend group.   Clinical Observations/Feedback:  Patient did not attend group.   Boomer Winders LRT/CTRS        Marsheila Alejo 12/20/2018 10:27 AM

## 2018-12-20 NOTE — Progress Notes (Signed)
[facility] MD Progress Note  12/20/2018 5:53 PM Ian [name]  MRN:  [id number] Subjective: Follow-up for this patient with bipolar disorder.  Patient says he is feeling better.  Less depressed.  No specific new medical complaints.  Still worried about his financial situation at home.  Significant anxiety.  Participating in treatment appropriately on the unit. Principal Problem: Bipolar I disorder, most recent episode depressed with anxious distress ([place]) Diagnosis: Principal Problem:   Bipolar I disorder, most recent episode depressed with anxious distress ([place]) Active Problems:   HTN (hypertension)   Alcohol use disorder, moderate, dependence ([place])   Dyslipidemia   Major depression, recurrent, chronic ([place])   Hepatitis C  Total Time spent with patient: 20 minutes  Past Psychiatric History: Past history of depression anxiety OCD and alcohol abuse.  Past Medical History:  Past Medical History:  Diagnosis Date  . Anxiety   . Barrett esophagus   . Cancer ([place])   . Coronary artery disease   . Depression   . GERD (gastroesophageal reflux disease)   . Hypertension   . Pre-diabetes   . Stroke [facility]) 2014   "mini-stroke" per patient    Past Surgical History:  Procedure Laterality Date  . ANGIOPLASTY    . APPENDECTOMY    . CARDIAC CATHETERIZATION    . CHOLECYSTECTOMY    . WRIST SURGERY Right    age 57   Family History:  Family History  Problem Relation Age of Onset  . Stroke Father   . Leukemia Father   . Breast cancer Sister    Family Psychiatric  History: See previous note Social History:  Social History   Substance and Sexual Activity  Alcohol Use Yes   Comment: denies being a daily drinker but did have alcohol today     Social History   Substance and Sexual Activity  Drug Use Yes  . Types: Cocaine, Marijuana   Comment: used cocaine today; denies smoking marijuana for years    Social History   Socioeconomic History  . Marital status: Single    Spouse name:  Not on file  . Number of children: Not on file  . Years of education: Not on file  . Highest education level: Not on file  Occupational History  . Not on file  Social Needs  . Financial resource strain: Not on file  . Food insecurity:    Worry: Not on file    Inability: Not on file  . Transportation needs:    Medical: Not on file    Non-medical: Not on file  Tobacco Use  . Smoking status: Current Every Day Smoker    Packs/day: 1.00    Types: Cigarettes  . Smokeless tobacco: Never Used  Substance and Sexual Activity  . Alcohol use: Yes    Comment: denies being a daily drinker but did have alcohol today  . Drug use: Yes    Types: Cocaine, Marijuana    Comment: used cocaine today; denies smoking marijuana for years  . Sexual activity: Not on file  Lifestyle  . Physical activity:    Days per week: Not on file    Minutes per session: Not on file  . Stress: Not on file  Relationships  . Social connections:    Talks on phone: Not on file    Gets together: Not on file    Attends religious service: Not on file    Active member of club or organization: Not on file    Attends meetings of clubs  or organizations: Not on file    Relationship status: Not on file  Other Topics Concern  . Not on file  Social History Narrative  . Not on file   Additional Social History:                         Sleep: Fair  Appetite:  Fair  Current Medications: Current Facility-Administered Medications  Medication Dose Route Frequency Provider Last Rate Last Dose  . albuterol (PROVENTIL HFA;VENTOLIN HFA) 108 (90 Base) MCG/ACT inhaler 2 puff  2 puff Inhalation Q6H PRN [name], MD   2 puff at 12/13/18 0813  . alum & mag hydroxide-simeth (MAALOX/MYLANTA) 200-200-20 MG/5ML suspension 30 mL  30 mL Oral Q4H PRN [name], MD      . aspirin chewable tablet 81 mg  81 mg Oral Daily [name], MD   81 mg at 12/20/18 0802  . atorvastatin (LIPITOR) tablet 40 mg  40 mg Oral q1800  [name], MD   40 mg at 12/20/18 1736  . busPIRone (BUSPAR) tablet 10 mg  10 mg Oral TID [name], [name], MD   10 mg at 12/20/18 1736  . cloNIDine (CATAPRES) tablet 0.1 mg  0.1 mg Oral BID PRN [name], MD   0.1 mg at 12/18/18 1750  . escitalopram (LEXAPRO) tablet 10 mg  10 mg Oral Daily [name], [name], MD   10 mg at 12/20/18 0802  . famotidine (PEPCID) tablet 40 mg  40 mg Oral QHS [name], MD   40 mg at 12/19/18 2130  . ibuprofen (ADVIL,MOTRIN) tablet 600 mg  600 mg Oral Q6H PRN [name], [name], MD   600 mg at 12/20/18 0804  . lisinopril (PRINIVIL,ZESTRIL) tablet 20 mg  20 mg Oral Daily [name], MD   20 mg at 12/20/18 0802  . magnesium hydroxide (MILK OF MAGNESIA) suspension 30 mL  30 mL Oral Daily PRN [name], MD      . Melatonin TABS 5 mg  5 mg Oral QHS [name], [name], MD   5 mg at 12/19/18 2131  . mometasone-formoterol (DULERA) 200-5 MCG/ACT inhaler 2 puff  2 puff Inhalation BID [name], [name], MD   2 puff at 12/20/18 0803  . multivitamin with minerals tablet 1 tablet  1 tablet Oral Daily [name], MD   1 tablet at 12/19/18 0843  . nicotine (NICODERM CQ - dosed in mg/24 hours) patch 21 mg  21 mg Transdermal Daily [name], MD   21 mg at 12/20/18 0806  . QUEtiapine (SEROQUEL XR) 24 hr tablet 300 mg  300 mg Oral QHS [name], [name], MD   300 mg at 12/19/18 2131  . Sofosbuvir-Velpatasvir 400-100 MG TABS 1 tablet  1 tablet Oral q1800 [name], MD   1 tablet at 12/19/18 1818  . thiamine (VITAMIN B-1) tablet 100 mg  100 mg Oral Daily [name], MD   100 mg at 12/20/18 0802  . zolpidem (AMBIEN) tablet 10 mg  10 mg Oral QHS [name], [name], MD   10 mg at 12/19/18 2130    Lab Results: No results found for this or any previous visit (from the past 48 hour(s)).  Blood Alcohol level:  Lab Results  Component Value Date   ETH <10 12/12/2018   ETH 205 (H) 23/53/6144    Metabolic Disorder Labs: Lab Results  Component Value Date  HGBA1C 6.4  (H) 12/13/2018   MPG 136.98 12/13/2018   MPG 116.89 05/05/2018   No results found for: PROLACTIN Lab Results  Component Value Date   CHOL 131 12/13/2018   TRIG 167 (H) 12/13/2018   HDL 28 (L) 12/13/2018   CHOLHDL 4.7 12/13/2018   VLDL 33 12/13/2018   LDLCALC 70 12/13/2018   LDLCALC 69 05/05/2018    Physical Findings: AIMS:  , ,  ,  ,    CIWA:  CIWA-Ar Total: 0 COWS:     Musculoskeletal: Strength & Muscle Tone: within normal limits Gait & Station: normal Patient leans: N/A  Psychiatric Specialty Exam: Physical Exam  Nursing note and vitals reviewed. Constitutional: He appears well-developed and well-nourished.  HENT:  Head: Normocephalic and atraumatic.  Eyes: Pupils are equal, round, and reactive to light. Conjunctivae are normal.  Neck: Normal range of motion.  Cardiovascular: Regular rhythm and normal heart sounds.  Respiratory: Effort normal. No respiratory distress.  GI: Soft.  Musculoskeletal: Normal range of motion.  Neurological: He is alert.  Skin: Skin is warm and dry.  Psychiatric: He has a normal mood and affect. His speech is normal and behavior is normal. Judgment and thought content normal. Cognition and memory are normal.    Review of Systems  Constitutional: Negative.   HENT: Negative.   Eyes: Negative.   Respiratory: Negative.   Cardiovascular: Negative.   Gastrointestinal: Negative.   Musculoskeletal: Negative.   Skin: Negative.   Neurological: Negative.   Psychiatric/Behavioral: Negative for depression, hallucinations, memory loss, substance abuse and suicidal ideas. The patient is nervous/anxious. The patient does not have insomnia.     Blood pressure 111/90, pulse 80, temperature 97.6 F (36.4 C), temperature source Oral, resp. rate 16, height 6' (1.829 m), weight 127.9 kg, SpO2 95 %.Body mass index is 38.25 kg/m.  General Appearance: Casual  Eye Contact:  Fair  Speech:  Clear and Coherent  Volume:  Normal  Mood:  Dysphoric  Affect:   Congruent  Thought Process:  Goal Directed  Orientation:  Full (Time, Place, and Person)  Thought Content:  Logical  Suicidal Thoughts:  No  Homicidal Thoughts:  No  Memory:  Immediate;   Fair Recent;   Fair Remote;   Fair  Judgement:  Fair  Insight:  Fair  Psychomotor Activity:  Decreased  Concentration:  Concentration: Fair  Recall:  [employer] of Knowledge:  Fair  Language:  Fair  Akathisia:  No  Handed:  Right  AIMS (if indicated):     Assets:  Desire for Improvement Housing  ADL's:  Intact  Cognition:  WNL  Sleep:  Number of Hours: 6     Treatment Plan Summary: Daily contact with patient to assess and evaluate symptoms and progress in treatment, Medication management and Plan No change to medication.  Supportive therapy.  Review of treatment plan.  Case discussed with social work who may look into whether any other resources are available financially.  Patient is likely to be discharged within about 2 days.  Alethia Berthold, MD 12/20/2018, 5:53 PM

## 2018-12-20 NOTE — Plan of Care (Signed)
Patient present in the milieu with a steady gait, observed in the dayroom throughout the shift interacting with select peers. Compliant with treatment plan, goal for today is be active and make some plans regarding his upcoming discharge. Endorses some anxiety rating his depression a 4 and his anxiety a 5 in anticipation of his discharge. Milieu remains safe with q 15 minute safety checks.

## 2018-12-21 NOTE — Plan of Care (Signed)
Pt. Reports improvement in coping. Pt is complaint with medications. Pt. Denies si/hi/avh, contracts for safety. Pt. Reports he can remain safe while on the unit. Pt. Participation in unit activities and groups appropriate.    Problem: Coping: Goal: Coping ability will improve Outcome: Progressing   Problem: Medication: Goal: Compliance with prescribed medication regimen will improve Outcome: Progressing   Problem: Self-Concept: Goal: Ability to disclose and discuss suicidal ideas will improve Outcome: Progressing   Problem: Activity: Goal: Interest or engagement in leisure activities will improve Outcome: Progressing   Problem: Safety: Goal: Ability to disclose and discuss suicidal ideas will improve Outcome: Progressing

## 2018-12-21 NOTE — Progress Notes (Signed)
D: Pt denies SI/HI/AVH, contracts for safety. Pt is pleasant and cooperative, engages actively during assessments. Pt. Frequently observed around the unit socializing well and affect much brighter, but during assessments discussing plans for discharge the patient is visibly anxious and tense. Pt. Spoke with this writer this shift about ways to be successful upon discharge. Pt. Anxious about putting together a plan to be successful, but motivated. Pt. Reports anxiety and depression are improving overall.   A: Q x 15 minute observation checks were completed for safety. Patient was provided with education, but needs reinforcement. Patient was given/offered medications per orders. Patient  was encourage to attend groups, participate in unit activities and continue with plan of care. Pt. Chart and plans of care reviewed. Pt. Given support and encouragement.   R: Patient is complaint with medication and unit procedures. Pt. Attends snack time, eating good. Pt. Attends group. Pt. Educated to utilize mobility equipment for safety to get up when needed or to call for help.              Precautionary checks every 15 minutes for safety maintained, room free of safety hazards, patient sustains no injury or falls during this shift. Will endorse care to next shift.

## 2018-12-21 NOTE — [facility] Group Notes (Signed)
  LCSW Group Therapy Note Tuesday December 31 /2019    Type of Therapy/Topic:  Group Therapy:  Feelings about Diagnosis  Participation Level:  Did not participate   Description of Group:   This group will allow patients to explore their thoughts and feelings about diagnoses they have received. Patients will be guided to explore their level of understanding and acceptance of these diagnoses. Facilitator will encourage patients to process their thoughts and feelings about the reactions of others to their diagnosis and will guide patients in identifying ways to discuss their diagnosis with significant others in their lives. This group will be process-oriented, with patients participating in exploration of their own experiences, giving and receiving support, and processing challenge from other group members.   Therapeutic Goals: 1. Patient will demonstrate understanding of diagnosis as evidenced by identifying two or more symptoms of the disorder 2. Patient will be able to express two feelings regarding the diagnosis 3. Patient will demonstrate their ability to communicate their needs through discussion and/or role play  Summary of Patient Progress:   He was practicing self care and elected to have a nap    Therapeutic Modalities:   Cognitive Behavioral Therapy Brief Therapy Feelings Identification   [place] LCSW [phone]

## 2018-12-21 NOTE — Progress Notes (Signed)
CSW made request to [employer] regarding pt past due [employer] of [phone] and also for space heater due to pt having no source of heat in his trailer.  Sandy Firefighter at [employer] both requests, reports she has ordered the heater to be delivered to the pt trailer from [employer] and that she will pay the past due balance with [employer] by phone.  Winferd Humphrey, MSW, LCSW Clinical Social Worker 12/21/2018 12:58 PM

## 2018-12-21 NOTE — Progress Notes (Signed)
Recreation Therapy Notes  Date: 12/21/2018  Time: 9:30 am   Location: Craft room   Behavioral response: N/A   Intervention Topic: Values  Discussion/Intervention: Patient did not attend group.   Clinical Observations/Feedback:  Patient did not attend group.   Toia Micale LRT/CTRS        Minervia Osso 12/21/2018 10:19 AM

## 2018-12-21 NOTE — [facility] Group Notes (Signed)
[facility] Group Notes:  (Nursing/MHT/Case Management/Adjunct)  Date:  12/21/2018  Time:  4:52 AM  Type of Therapy:  Group Therapy  Participation Level:  Active  Participation Quality:  Appropriate  Affect:  Appropriate  Cognitive:  Appropriate  Insight:  Good  Engagement in Group:  Engaged  Modes of Intervention:  Support  Summary of Progress/Problems:  Ian [name] 12/21/2018, 4:52 AM

## 2018-12-21 NOTE — Plan of Care (Signed)
Data: Patient is appropriate and cooperative to assessment. Patient denies SI/HI/AVH. Patient has completed daily self inventory worksheet.  Patient has complaint of lower back pain and a pain rating of 8/10, which is relieved by PRN medication. Patient reports fair sleep quality, appetite is good for the last 24 hours. Patient rates depression "5/10" , feelings of hopelessness "5/10" and anxiety "5/10" Patients goal for today is left blank.  Action:  Q x 15 minute observation checks were completed for safety. Patient was provided with education on medications. Patient was offered support and encouragement. Patient was given scheduled medications. Patient  was encourage to attend groups, participate in unit activities and continue with plan of care.    Response: Patient is adherent with scheduled medication. Patient has no complaints at this time. Patient is receptive to treatment and safety maintained on unit.    Problem: Coping: Goal: Coping ability will improve Outcome: Progressing   Problem: Medication: Goal: Compliance with prescribed medication regimen will improve Outcome: Progressing   Problem: Self-Concept: Goal: Ability to disclose and discuss suicidal ideas will improve Outcome: Progressing

## 2018-12-21 NOTE — Progress Notes (Signed)
[facility] MD Progress Note  12/21/2018 6:23 PM Ian [name]  MRN:  [id number] Subjective: Follow-up patient with depression.  States his mood is better.  He is dressing more neatly taking care of his hygiene better.  Feels more optimistic about life outside the hospital.  Denies any current suicidal ideation.  Physically feeling better.  Blood pressure under control.  No acute physical symptoms. Principal Problem: Bipolar I disorder, most recent episode depressed with anxious distress ([place]) Diagnosis: Principal Problem:   Bipolar I disorder, most recent episode depressed with anxious distress ([place]) Active Problems:   HTN (hypertension)   Alcohol use disorder, moderate, dependence ([place])   Dyslipidemia   Major depression, recurrent, chronic ([place])   Hepatitis C  Total Time spent with patient: 30 minutes  Past Psychiatric History: Patient has a long history of alcohol abuse and depression with multiple hospitalizations.  Recently seems to be keeping the alcohol under control  Past Medical History:  Past Medical History:  Diagnosis Date  . Anxiety   . Barrett esophagus   . Cancer ([place])   . Coronary artery disease   . Depression   . GERD (gastroesophageal reflux disease)   . Hypertension   . Pre-diabetes   . Stroke [employer]) 2014   "mini-stroke" per patient    Past Surgical History:  Procedure Laterality Date  . ANGIOPLASTY    . APPENDECTOMY    . CARDIAC CATHETERIZATION    . CHOLECYSTECTOMY    . WRIST SURGERY Right    age 8   Family History:  Family History  Problem Relation Age of Onset  . Stroke Father   . Leukemia Father   . Breast cancer Sister    Family Psychiatric  History: None known Social History:  Social History   Substance and Sexual Activity  Alcohol Use Yes   Comment: denies being a daily drinker but did have alcohol today     Social History   Substance and Sexual Activity  Drug Use Yes  . Types: Cocaine, Marijuana   Comment: used cocaine today; denies  smoking marijuana for years    Social History   Socioeconomic History  . Marital status: Single    Spouse name: Not on file  . Number of children: Not on file  . Years of education: Not on file  . Highest education level: Not on file  Occupational History  . Not on file  Social Needs  . Financial resource strain: Not on file  . Food insecurity:    Worry: Not on file    Inability: Not on file  . Transportation needs:    Medical: Not on file    Non-medical: Not on file  Tobacco Use  . Smoking status: Current Every Day Smoker    Packs/day: 1.00    Types: Cigarettes  . Smokeless tobacco: Never Used  Substance and Sexual Activity  . Alcohol use: Yes    Comment: denies being a daily drinker but did have alcohol today  . Drug use: Yes    Types: Cocaine, Marijuana    Comment: used cocaine today; denies smoking marijuana for years  . Sexual activity: Not on file  Lifestyle  . Physical activity:    Days per week: Not on file    Minutes per session: Not on file  . Stress: Not on file  Relationships  . Social connections:    Talks on phone: Not on file    Gets together: Not on file    Attends religious service: Not  on file    Active member of club or organization: Not on file    Attends meetings of clubs or organizations: Not on file    Relationship status: Not on file  Other Topics Concern  . Not on file  Social History Narrative  . Not on file   Additional Social History:                         Sleep: Fair  Appetite:  Fair  Current Medications: Current Facility-Administered Medications  Medication Dose Route Frequency Provider Last Rate Last Dose  . albuterol (PROVENTIL HFA;VENTOLIN HFA) 108 (90 Base) MCG/ACT inhaler 2 puff  2 puff Inhalation Q6H PRN [name], MD   2 puff at 12/13/18 0813  . alum & mag hydroxide-simeth (MAALOX/MYLANTA) 200-200-20 MG/5ML suspension 30 mL  30 mL Oral Q4H PRN [name], MD      . aspirin chewable tablet 81 mg  81  mg Oral Daily [name], MD   81 mg at 12/21/18 0803  . atorvastatin (LIPITOR) tablet 40 mg  40 mg Oral q1800 [name], MD   40 mg at 12/21/18 1818  . busPIRone (BUSPAR) tablet 10 mg  10 mg Oral TID Lafaye Mcelmurry, [name], MD   10 mg at 12/21/18 1818  . cloNIDine (CATAPRES) tablet 0.1 mg  0.1 mg Oral BID PRN [name], MD   0.1 mg at 12/18/18 1750  . escitalopram (LEXAPRO) tablet 10 mg  10 mg Oral Daily Messiyah Waterson, [name], MD   10 mg at 12/21/18 0803  . famotidine (PEPCID) tablet 40 mg  40 mg Oral QHS [name], MD   40 mg at 12/20/18 2118  . ibuprofen (ADVIL,MOTRIN) tablet 600 mg  600 mg Oral Q6H PRN Nai Dasch, [name], MD   600 mg at 12/21/18 1818  . lisinopril (PRINIVIL,ZESTRIL) tablet 20 mg  20 mg Oral Daily [name], MD   20 mg at 12/21/18 0803  . magnesium hydroxide (MILK OF MAGNESIA) suspension 30 mL  30 mL Oral Daily PRN [name], MD      . Melatonin TABS 5 mg  5 mg Oral QHS Anaja Monts, [name], MD   5 mg at 12/20/18 2118  . mometasone-formoterol (DULERA) 200-5 MCG/ACT inhaler 2 puff  2 puff Inhalation BID Alistar Mcenery, [name], MD   2 puff at 12/21/18 0803  . multivitamin with minerals tablet 1 tablet  1 tablet Oral Daily [name], MD   1 tablet at 12/21/18 0804  . nicotine (NICODERM CQ - dosed in mg/24 hours) patch 21 mg  21 mg Transdermal Daily [name], MD   21 mg at 12/21/18 0803  . QUEtiapine (SEROQUEL XR) 24 hr tablet 300 mg  300 mg Oral QHS Nomi Rudnicki T, MD   300 mg at 12/20/18 2118  . Sofosbuvir-Velpatasvir 400-100 MG TABS 1 tablet  1 tablet Oral q1800 [name], MD   1 tablet at 12/21/18 1818  . thiamine (VITAMIN B-1) tablet 100 mg  100 mg Oral Daily [name], MD   100 mg at 12/21/18 2094  . zolpidem (AMBIEN) tablet 10 mg  10 mg Oral QHS Nial Hawe, [name], MD   10 mg at 12/20/18 2118    Lab Results: No results found for this or any previous visit (from the past 48 hour(s)).  Blood Alcohol level:  Lab Results  Component Value Date   ETH <  10  12/12/2018   ETH 205 (H) 86/57/8469    Metabolic Disorder Labs: Lab Results  Component Value Date   HGBA1C 6.4 (H) 12/13/2018   MPG 136.98 12/13/2018   MPG 116.89 05/05/2018   No results found for: PROLACTIN Lab Results  Component Value Date   CHOL 131 12/13/2018   TRIG 167 (H) 12/13/2018   HDL 28 (L) 12/13/2018   CHOLHDL 4.7 12/13/2018   VLDL 33 12/13/2018   LDLCALC 70 12/13/2018   LDLCALC 69 05/05/2018    Physical Findings: AIMS:  , ,  ,  ,    CIWA:  CIWA-Ar Total: 0 COWS:     Musculoskeletal: Strength & Muscle Tone: within normal limits Gait & Station: normal Patient leans: N/A  Psychiatric Specialty Exam: Physical Exam  Nursing note and vitals reviewed. Constitutional: He appears well-developed and well-nourished.  HENT:  Head: Normocephalic and atraumatic.  Eyes: Pupils are equal, round, and reactive to light. Conjunctivae are normal.  Neck: Normal range of motion.  Cardiovascular: Regular rhythm and normal heart sounds.  Respiratory: Effort normal.  GI: Soft.  Musculoskeletal: Normal range of motion.  Neurological: He is alert.  Skin: Skin is warm and dry.  Psychiatric: He has a normal mood and affect. His behavior is normal. Judgment and thought content normal.    Review of Systems  Constitutional: Negative.   HENT: Negative.   Eyes: Negative.   Respiratory: Negative.   Cardiovascular: Negative.   Gastrointestinal: Negative.   Musculoskeletal: Negative.   Skin: Negative.   Neurological: Negative.   Psychiatric/Behavioral: Negative.     Blood pressure 137/87, pulse 77, temperature 98.4 F (36.9 C), temperature source Oral, resp. rate 18, height 6' (1.829 m), weight 127.9 kg, SpO2 99 %.Body mass index is 38.25 kg/m.  General Appearance: Casual  Eye Contact:  Good  Speech:  Clear and Coherent  Volume:  Normal  Mood:  Euthymic  Affect:  Appropriate  Thought Process:  Goal Directed  Orientation:  Full (Time, Place, and Person)  Thought  Content:  Logical  Suicidal Thoughts:  No  Homicidal Thoughts:  No  Memory:  Immediate;   Fair Recent;   Fair Remote;   Fair  Judgement:  Fair  Insight:  Fair  Psychomotor Activity:  Decreased  Concentration:  Concentration: Fair  Recall:  [employer] of Knowledge:  Fair  Language:  Fair  Akathisia:  No  Handed:  Right  AIMS (if indicated):     Assets:  Desire for Improvement Housing  ADL's:  Intact  Cognition:  WNL  Sleep:  Number of Hours: 6     Treatment Plan Summary: Medication management and Plan Clearly improving.  Benefits from group therapy and individual interactions.  He is working with social work about getting things arranged for a more safe home experience.  Patient is likely to be discharged Thursday morning.  Alethia Berthold, MD 12/21/2018, 6:23 PM

## 2018-12-22 MED ORDER — BUSPIRONE HCL 10 MG PO TABS: 10.0000 mg | ORAL_TABLET | Freq: Three times a day (TID) | ORAL | 1 refills | Status: DC

## 2018-12-22 MED ORDER — MOMETASONE FURO-FORMOTEROL FUM 200-5 MCG/ACT IN AERO: 2.0000 | INHALATION_SPRAY | Freq: Two times a day (BID) | RESPIRATORY_TRACT | 0 refills | Status: DC

## 2018-12-22 MED ORDER — IBUPROFEN 600 MG PO TABS: 600.0000 mg | ORAL_TABLET | Freq: Four times a day (QID) | ORAL | 0 refills | Status: DC | PRN

## 2018-12-22 MED ORDER — FAMOTIDINE 40 MG PO TABS: 40.0000 mg | ORAL_TABLET | Freq: Every day | ORAL | 1 refills | Status: DC

## 2018-12-22 MED ORDER — ASPIRIN 81 MG PO CHEW: 81.0000 mg | CHEWABLE_TABLET | Freq: Every day | ORAL | 1 refills | Status: DC

## 2018-12-22 MED ORDER — IBUPROFEN 600 MG PO TABS: 600.0000 mg | ORAL_TABLET | Freq: Four times a day (QID) | ORAL | 1 refills | Status: DC | PRN

## 2018-12-22 MED ORDER — ATORVASTATIN CALCIUM 40 MG PO TABS: 40.0000 mg | ORAL_TABLET | Freq: Every day | ORAL | 0 refills | Status: DC

## 2018-12-22 MED ORDER — SOFOSBUVIR-VELPATASVIR 400-100 MG PO TABS: 1.0000 | ORAL_TABLET | Freq: Every day | ORAL | 0 refills | Status: DC

## 2018-12-22 MED ORDER — FAMOTIDINE 40 MG PO TABS: 40.0000 mg | ORAL_TABLET | Freq: Every day | ORAL | 0 refills | Status: DC

## 2018-12-22 MED ORDER — ESCITALOPRAM OXALATE 10 MG PO TABS: 10.0000 mg | ORAL_TABLET | Freq: Every day | ORAL | 1 refills | Status: DC

## 2018-12-22 MED ORDER — ALBUTEROL SULFATE HFA 108 (90 BASE) MCG/ACT IN AERS: 2.0000 | INHALATION_SPRAY | Freq: Four times a day (QID) | RESPIRATORY_TRACT | 0 refills | Status: DC | PRN

## 2018-12-22 MED ORDER — ZOLPIDEM TARTRATE 10 MG PO TABS: 10.0000 mg | ORAL_TABLET | Freq: Every day | ORAL | 1 refills | Status: DC

## 2018-12-22 MED ORDER — THIAMINE HCL 100 MG PO TABS: 100.0000 mg | ORAL_TABLET | Freq: Every day | ORAL | 0 refills | Status: DC

## 2018-12-22 MED ORDER — QUETIAPINE FUMARATE ER 300 MG PO TB24: 300.0000 mg | ORAL_TABLET | Freq: Every day | ORAL | 1 refills | Status: DC

## 2018-12-22 MED ORDER — MELATONIN 5 MG PO TABS: 5.0000 mg | ORAL_TABLET | Freq: Every day | ORAL | 1 refills | Status: DC

## 2018-12-22 MED ORDER — ZOLPIDEM TARTRATE 10 MG PO TABS: 10.0000 mg | ORAL_TABLET | Freq: Every day | ORAL | 0 refills | Status: DC

## 2018-12-22 MED ORDER — THIAMINE HCL 100 MG PO TABS: 100.0000 mg | ORAL_TABLET | Freq: Every day | ORAL | 1 refills | Status: DC

## 2018-12-22 MED ORDER — CLONIDINE HCL 0.1 MG PO TABS: 0.1000 mg | ORAL_TABLET | Freq: Two times a day (BID) | ORAL | 1 refills | Status: DC | PRN

## 2018-12-22 MED ORDER — BUSPIRONE HCL 10 MG PO TABS: 10.0000 mg | ORAL_TABLET | Freq: Three times a day (TID) | ORAL | 0 refills | Status: DC

## 2018-12-22 MED ORDER — LISINOPRIL 20 MG PO TABS: 20.0000 mg | ORAL_TABLET | Freq: Every day | ORAL | 0 refills | Status: DC

## 2018-12-22 MED ORDER — ASPIRIN 81 MG PO CHEW: 81.0000 mg | CHEWABLE_TABLET | Freq: Every day | ORAL | 0 refills | Status: DC

## 2018-12-22 MED ORDER — MOMETASONE FURO-FORMOTEROL FUM 200-5 MCG/ACT IN AERO: 2.0000 | INHALATION_SPRAY | Freq: Two times a day (BID) | RESPIRATORY_TRACT | 1 refills | Status: DC

## 2018-12-22 MED ORDER — ESCITALOPRAM OXALATE 10 MG PO TABS: 10.0000 mg | ORAL_TABLET | Freq: Every day | ORAL | 0 refills | Status: DC

## 2018-12-22 MED ORDER — MELATONIN 5 MG PO TABS: 5.0000 mg | ORAL_TABLET | Freq: Every day | ORAL | 0 refills | Status: DC

## 2018-12-22 MED ORDER — QUETIAPINE FUMARATE ER 300 MG PO TB24: 300.0000 mg | ORAL_TABLET | Freq: Every day | ORAL | 0 refills | Status: DC

## 2018-12-22 NOTE — Progress Notes (Signed)
Recreation Therapy Notes   Date: 12/22/2018  Time: 9:30 am   Location: Craft room   Behavioral response: N/A   Intervention Topic: Time Management  Discussion/Intervention: Patient did not attend group.   Clinical Observations/Feedback:  Patient did not attend group.   Johana Hopkinson LRT/CTRS        Rayland Hamed 12/22/2018 11:33 AM

## 2018-12-22 NOTE — Tx Team (Signed)
Interdisciplinary Treatment and Diagnostic Plan Update  12/22/2018 Time of Session: 0840am Kennan Detter MRN: [id number]  Principal Diagnosis: Bipolar I disorder, most recent episode depressed with anxious distress ([place])  Secondary Diagnoses: Principal Problem:   Bipolar I disorder, most recent episode depressed with anxious distress ([place]) Active Problems:   HTN (hypertension)   Alcohol use disorder, moderate, dependence ([place])   Dyslipidemia   Major depression, recurrent, chronic ([facility])   Hepatitis C   Current Medications:  Current Facility-Administered Medications  Medication Dose Route Frequency Provider Last Rate Last Dose  . albuterol (PROVENTIL HFA;VENTOLIN HFA) 108 (90 Base) MCG/ACT inhaler 2 puff  2 puff Inhalation Q6H PRN [name], MD   2 puff at 12/13/18 0813  . alum & mag hydroxide-simeth (MAALOX/MYLANTA) 200-200-20 MG/5ML suspension 30 mL  30 mL Oral Q4H PRN [name], MD      . aspirin chewable tablet 81 mg  81 mg Oral Daily [name], MD   81 mg at 12/22/18 0737  . atorvastatin (LIPITOR) tablet 40 mg  40 mg Oral q1800 [name], MD   40 mg at 12/21/18 1818  . busPIRone (BUSPAR) tablet 10 mg  10 mg Oral TID [name], [name], MD   10 mg at 12/22/18 1136  . cloNIDine (CATAPRES) tablet 0.1 mg  0.1 mg Oral BID PRN [name], MD   0.1 mg at 12/18/18 1750  . escitalopram (LEXAPRO) tablet 10 mg  10 mg Oral Daily [name], [name], MD   10 mg at 12/22/18 0737  . famotidine (PEPCID) tablet 40 mg  40 mg Oral QHS [name], MD   40 mg at 12/21/18 2224  . ibuprofen (ADVIL,MOTRIN) tablet 600 mg  600 mg Oral Q6H PRN [name], [name], MD   600 mg at 12/21/18 1818  . lisinopril (PRINIVIL,ZESTRIL) tablet 20 mg  20 mg Oral Daily [name], MD   20 mg at 12/22/18 0737  . magnesium hydroxide (MILK OF MAGNESIA) suspension 30 mL  30 mL Oral Daily PRN [name], MD      . Melatonin TABS 5 mg  5 mg Oral QHS [name], [name], MD   5 mg at 12/21/18 2224  .  mometasone-formoterol (DULERA) 200-5 MCG/ACT inhaler 2 puff  2 puff Inhalation BID [name], [name], MD   2 puff at 12/22/18 [phone]  . multivitamin with minerals tablet 1 tablet  1 tablet Oral Daily [name], MD   1 tablet at 12/22/18 0737  . nicotine (NICODERM CQ - dosed in mg/24 hours) patch 21 mg  21 mg Transdermal Daily [name], MD   21 mg at 12/22/18 0743  . QUEtiapine (SEROQUEL XR) 24 hr tablet 300 mg  300 mg Oral QHS [name], [name], MD   300 mg at 12/21/18 2224  . Sofosbuvir-Velpatasvir 400-100 MG TABS 1 tablet  1 tablet Oral q1800 [name], MD   1 tablet at 12/21/18 1818  . thiamine (VITAMIN B-1) tablet 100 mg  100 mg Oral Daily [name], MD   100 mg at 12/22/18 0737  . zolpidem (AMBIEN) tablet 10 mg  10 mg Oral QHS [name], [name], MD   10 mg at 12/21/18 2224   PTA Medications: Medications Prior to Admission  Medication Sig Dispense Refill Last Dose  . ADVAIR DISKUS 250-50 MCG/DOSE AEPB INHALE 1 INHALATION INTO THE LUNGS EVERY 12 (TWELVE) HOURS  12   . aspirin 81 MG EC  tablet Take 1 tablet (81 mg total) by mouth daily. 30 tablet 1   . gabapentin (NEURONTIN) 300 MG capsule Take 1 capsule (300 mg total) by mouth 3 (three) times daily. 90 capsule 1   . hydrOXYzine (ATARAX/VISTARIL) 50 MG tablet Take 1 tablet (50 mg total) by mouth every 6 (six) hours as needed for anxiety. 60 tablet 1   . [DISCONTINUED] albuterol (PROVENTIL HFA;VENTOLIN HFA) 108 (90 Base) MCG/ACT inhaler Inhale 2 puffs into the lungs every 6 (six) hours as needed for wheezing or shortness of breath. 1 Inhaler 2   . [DISCONTINUED] atorvastatin (LIPITOR) 40 MG tablet Take 1 tablet (40 mg total) by mouth daily at 6 PM. 30 tablet 1   . [DISCONTINUED] lisinopril (PRINIVIL,ZESTRIL) 20 MG tablet Take 1 tablet (20 mg total) by mouth daily. 30 tablet 1     Patient Stressors: Financial difficulties Health problems Medication change or noncompliance  Patient Strengths: Ability for insight Active sense of  humor Average or above average intelligence Capable of independent living Communication skills General fund of knowledge Motivation for treatment/growth Supportive family/friends  Treatment Modalities: Medication Management, Group therapy, Case management,  1 to 1 session with clinician, Psychoeducation, Recreational therapy.   Physician Treatment Plan for Primary Diagnosis: Bipolar I disorder, most recent episode depressed with anxious distress ([place]) Long Term Goal(s): Improvement in symptoms so as ready for discharge Improvement in symptoms so as ready for discharge   Short Term Goals: Ability to disclose and discuss suicidal ideas Ability to demonstrate self-control will improve Ability to maintain clinical measurements within normal limits will improve Compliance with prescribed medications will improve  Medication Management: Evaluate patient's response, side effects, and tolerance of medication regimen.  Therapeutic Interventions: 1 to 1 sessions, Unit Group sessions and Medication administration.  Evaluation of Outcomes: Progressing  Physician Treatment Plan for Secondary Diagnosis: Principal Problem:   Bipolar I disorder, most recent episode depressed with anxious distress ([place]) Active Problems:   HTN (hypertension)   Alcohol use disorder, moderate, dependence ([place])   Dyslipidemia   Major depression, recurrent, chronic ([place])   Hepatitis C  Long Term Goal(s): Improvement in symptoms so as ready for discharge Improvement in symptoms so as ready for discharge   Short Term Goals: Ability to disclose and discuss suicidal ideas Ability to demonstrate self-control will improve Ability to maintain clinical measurements within normal limits will improve Compliance with prescribed medications will improve     Medication Management: Evaluate patient's response, side effects, and tolerance of medication regimen.  Therapeutic Interventions: 1 to 1 sessions, Unit Group  sessions and Medication administration.  Evaluation of Outcomes: Progressing   RN Treatment Plan for Primary Diagnosis: Bipolar I disorder, most recent episode depressed with anxious distress ([place]) Long Term Goal(s): Knowledge of disease and therapeutic regimen to maintain health will improve  Short Term Goals: Ability to verbalize feelings will improve, Ability to identify and develop effective coping behaviors will improve and Compliance with prescribed medications will improve  Medication Management: RN will administer medications as ordered by provider, will assess and evaluate patient's response and provide education to patient for prescribed medication. RN will report any adverse and/or side effects to prescribing provider.  Therapeutic Interventions: 1 on 1 counseling sessions, Psychoeducation, Medication administration, Evaluate responses to treatment, Monitor vital signs and CBGs as ordered, Perform/monitor CIWA, COWS, AIMS and Fall Risk screenings as ordered, Perform wound care treatments as ordered.  Evaluation of Outcomes: Progressing   LCSW Treatment Plan for Primary Diagnosis: Bipolar I disorder, most recent  episode depressed with anxious distress ([place]) Long Term Goal(s): Safe transition to appropriate next level of care at discharge, Engage patient in therapeutic group addressing interpersonal concerns.  Short Term Goals: Engage patient in aftercare planning with referrals and resources  Therapeutic Interventions: Assess for all discharge needs, 1 to 1 time with Social worker, Explore available resources and support systems, Assess for adequacy in community support network, Educate family and significant other(s) on suicide prevention, Complete Psychosocial Assessment, Interpersonal group therapy.  Evaluation of Outcomes: Progressing   Progress in Treatment: Attending groups: No. Participating in groups: No. Taking medication as prescribed: Yes. Toleration medication:  Yes. Family/Significant other contact made: Yes, individual(s) contacted:  [name], friend Patient understands diagnosis: Yes. Discussing patient identified problems/goals with staff: Yes. Medical problems stabilized or resolved: Yes. Denies suicidal/homicidal ideation: Yes. Issues/concerns per patient self-inventory: No. Other: NA  New problem(s) identified: No, Describe:  None reported  New Short Term/Long Term Goal(s): "Get back on medication and get christmas out of the way"  Patient Goals:  "Get back on medication and get christmas out of the way"  Discharge Plan or Barriers: Pt will return home and follow up with outpatient treatment  Reason for Continuation of Hospitalization: Medication stabilization  Estimated Length of Stay: 3-5 days  Recreational Therapy: Patient Stressors: N/A Patient Goal: Patient will engage in groups without prompting or encouragement from LRT x3 group sessions within 5 recreation therapy group sessions  Attendees: Patient: 12/17/2018   Physician: Alethia Berthold, MD 12/17/2018   Nursing:  12/17/2018   RN Care Manager: 12/17/2018   Social Worker: Sanjuana Kava LCSW 12/17/2018   Recreational Therapist:  12/17/2018   Other: Assunta Curtis LCSW 12/17/2018   Other:  12/17/2018   Other: 12/17/2018        Scribe for Treatment Team: Yvette Rack, LCSW 12/22/2018 12:10 PM

## 2018-12-22 NOTE — Progress Notes (Signed)
D - Patient was in the day room upon arrival to the unit. Patient was pleasant during assessment and medication administration. Patient denies SI/HI/AVH, pain. Patient stated he had some anxiety and depression still but maybe 3/10 for both. Patient stated, "I am leaving on Thursday, I am ready. I feel so much better than when I first got down here." Patient seen interacting with peers and staff appropriately on the unit by this Probation officer.   A - Patient compliant with medication administration per MD orders and procedures on the unit. Patient provided with education. Patient provided with support and encouragement. Patient informed to let staff know if there are any issues or problems on the unit.   R - Patient verbally contracts for safety with this Probation officer. Patient calm and cooperative on the unit. Patient being monitored Q 15 minutes for safety per unit protocol. Patient remains safe at this time.

## 2018-12-22 NOTE — BHH Suicide Risk Assessment (Signed)
[facility] Discharge Suicide Risk Assessment   Principal Problem: Bipolar I disorder, most recent episode depressed with anxious distress [facility]) Discharge Diagnoses: Principal Problem:   Bipolar I disorder, most recent episode depressed with anxious distress ([place]) Active Problems:   HTN (hypertension)   Alcohol use disorder, moderate, dependence ([place])   Dyslipidemia   Major depression, recurrent, chronic ([place])   Hepatitis C   Total Time spent with patient: 45 minutes  Musculoskeletal: Strength & Muscle Tone: within normal limits Gait & Station: normal Patient leans: N/A  Psychiatric Specialty Exam: ROS  Blood pressure 104/83, pulse 74, temperature 97.7 F (36.5 C), temperature source Oral, resp. rate 16, height 6' (1.829 m), weight 127.9 kg, SpO2 95 %.Body mass index is 38.25 kg/m.  General Appearance: Casual  Eye Contact::  Good  Speech:  Clear and [id number]  Volume:  Normal  Mood:  Euthymic  Affect:  Congruent  Thought Process:  Goal Directed  Orientation:  Full (Time, Place, and Person)  Thought Content:  Logical  Suicidal Thoughts:  No  Homicidal Thoughts:  No  Memory:  Immediate;   Fair Recent;   Fair Remote;   Fair  Judgement:  Good  Insight:  Good  Psychomotor Activity:  Normal  Concentration:  Good  Recall:  [place]  Language: Fair  Akathisia:  No  Handed:  Right  AIMS (if indicated):     Assets:  Desire for Improvement Housing  Sleep:  Number of Hours: 5.25  Cognition: WNL  ADL's:  Intact   Mental Status Per Nursing Assessment::   On Admission:  Self-harm thoughts  Demographic Factors:  Male and Living alone  Loss Factors: Decrease in vocational status  Historical Factors: Prior suicide attempts and Impulsivity  Risk Reduction Factors:   Religious beliefs about death, Positive social support and Positive therapeutic relationship  Continued Clinical Symptoms:  Depression:   Hopelessness Alcohol/Substance  Abuse/Dependencies  Cognitive Features That Contribute To Risk:  Closed-mindedness    Suicide Risk:  Minimal: No identifiable suicidal ideation.  Patients presenting with no risk factors but with morbid ruminations; may be classified as minimal risk based on the severity of the depressive symptoms  Follow-up Information    [place] on 12/24/2018.   Why:  Please attend your appt on Friday, 12/24/18, at 2:30pm.  Please bring photo ID and your hospital discharge paperwork.  Contact information: [place] [address] [phone]           Plan Of Care/Follow-up recommendations:  Activity:  Activity as tolerated Diet:  Normal diet Other:  Follow-up with outpatient providers  Alethia Berthold, MD 12/22/2018, 7:06 PM

## 2018-12-22 NOTE — Progress Notes (Signed)
D: Patient stated slept good last night .Stated appetite is good and energy level   normal. Stated concentration  good . Stated on Depression scale 4 , hopeless 4 and anxiety 4 .( low 0-10 high) Denies suicidal  homicidal ideations  .  No auditory hallucinations  No pain concerns . Appropriate ADL'S. Interacting with peers and staff.Patient  aware of  discharge tomorrow  , returning home . Compliant  with medication given  denies suicidal ideations . Attending  unit programing   working on coping skills . Able to verbalize feeling of self . Understanding  information received .   A: Encourage patient participation with unit programming . Instruction  Given on  Medication , verbalize understanding.  R: Voice no other concerns. Staff continue to monitor

## 2018-12-22 NOTE — Plan of Care (Signed)
Patient compliant with medication administration per MD orders and procedures on the unit. Patient stated to this writer, "I feel so much better now than when I first got here. I am ready to leave on Thursday."   Problem: Medication: Goal: Compliance with prescribed medication regimen will improve Outcome: Progressing   Problem: Self-Concept: Goal: Will verbalize positive feelings about self Outcome: Progressing

## 2018-12-22 NOTE — Progress Notes (Signed)
[facility] MD Progress Note  12/22/2018 7:03 PM Ian [name]  MRN:  [id number] Subjective: Follow-up patient with depression.  Mood much better.  Affect brighter.  More optimistic.  Denies suicidal ideation.  Physically feeling better blood pressure under control.  More optimistic about the future. Principal Problem: Bipolar I disorder, most recent episode depressed with anxious distress ([place]) Diagnosis: Principal Problem:   Bipolar I disorder, most recent episode depressed with anxious distress ([place]) Active Problems:   HTN (hypertension)   Alcohol use disorder, moderate, dependence ([place])   Dyslipidemia   Major depression, recurrent, chronic ([place])   Hepatitis C  Total Time spent with patient: 20 minutes  Past Psychiatric History: Long history of depression and alcohol abuse  Past Medical History:  Past Medical History:  Diagnosis Date  . Anxiety   . Barrett esophagus   . Cancer ([place])   . Coronary artery disease   . Depression   . GERD (gastroesophageal reflux disease)   . Hypertension   . Pre-diabetes   . Stroke [facility]) 2014   "mini-stroke" per patient    Past Surgical History:  Procedure Laterality Date  . ANGIOPLASTY    . APPENDECTOMY    . CARDIAC CATHETERIZATION    . CHOLECYSTECTOMY    . WRIST SURGERY Right    age 59   Family History:  Family History  Problem Relation Age of Onset  . Stroke Father   . Leukemia Father   . Breast cancer Sister    Family Psychiatric  History: See previous note Social History:  Social History   Substance and Sexual Activity  Alcohol Use Yes   Comment: denies being a daily drinker but did have alcohol today     Social History   Substance and Sexual Activity  Drug Use Yes  . Types: Cocaine, Marijuana   Comment: used cocaine today; denies smoking marijuana for years    Social History   Socioeconomic History  . Marital status: Single    Spouse name: Not on file  . Number of children: Not on file  . Years of education: Not on  file  . Highest education level: Not on file  Occupational History  . Not on file  Social Needs  . Financial resource strain: Not on file  . Food insecurity:    Worry: Not on file    Inability: Not on file  . Transportation needs:    Medical: Not on file    Non-medical: Not on file  Tobacco Use  . Smoking status: Current Every Day Smoker    Packs/day: 1.00    Types: Cigarettes  . Smokeless tobacco: Never Used  Substance and Sexual Activity  . Alcohol use: Yes    Comment: denies being a daily drinker but did have alcohol today  . Drug use: Yes    Types: Cocaine, Marijuana    Comment: used cocaine today; denies smoking marijuana for years  . Sexual activity: Not on file  Lifestyle  . Physical activity:    Days per week: Not on file    Minutes per session: Not on file  . Stress: Not on file  Relationships  . Social connections:    Talks on phone: Not on file    Gets together: Not on file    Attends religious service: Not on file    Active member of club or organization: Not on file    Attends meetings of clubs or organizations: Not on file    Relationship status: Not on file  Other Topics Concern  . Not on file  Social History Narrative  . Not on file   Additional Social History:                         Sleep: Good  Appetite:  Good  Current Medications: Current Facility-Administered Medications  Medication Dose Route Frequency Provider Last Rate Last Dose  . albuterol (PROVENTIL HFA;VENTOLIN HFA) 108 (90 Base) MCG/ACT inhaler 2 puff  2 puff Inhalation Q6H PRN [name], MD   2 puff at 12/13/18 0813  . alum & mag hydroxide-simeth (MAALOX/MYLANTA) 200-200-20 MG/5ML suspension 30 mL  30 mL Oral Q4H PRN [name], MD      . aspirin chewable tablet 81 mg  81 mg Oral Daily [name], MD   81 mg at 12/22/18 0737  . atorvastatin (LIPITOR) tablet 40 mg  40 mg Oral q1800 [name], MD   40 mg at 12/22/18 1629  . busPIRone (BUSPAR) tablet 10 mg   10 mg Oral TID , [name], MD   10 mg at 12/22/18 1630  . cloNIDine (CATAPRES) tablet 0.1 mg  0.1 mg Oral BID PRN [name], MD   0.1 mg at 12/18/18 1750  . escitalopram (LEXAPRO) tablet 10 mg  10 mg Oral Daily , [name], MD   10 mg at 12/22/18 0737  . famotidine (PEPCID) tablet 40 mg  40 mg Oral QHS [name], MD   40 mg at 12/21/18 2224  . ibuprofen (ADVIL,MOTRIN) tablet 600 mg  600 mg Oral Q6H PRN , [name], MD   600 mg at 12/22/18 1304  . lisinopril (PRINIVIL,ZESTRIL) tablet 20 mg  20 mg Oral Daily [name], MD   20 mg at 12/22/18 0737  . magnesium hydroxide (MILK OF MAGNESIA) suspension 30 mL  30 mL Oral Daily PRN [name], MD      . Melatonin TABS 5 mg  5 mg Oral QHS , [name], MD   5 mg at 12/21/18 2224  . mometasone-formoterol (DULERA) 200-5 MCG/ACT inhaler 2 puff  2 puff Inhalation BID , [name], MD   2 puff at 12/22/18 [phone]  . multivitamin with minerals tablet 1 tablet  1 tablet Oral Daily [name], MD   1 tablet at 12/22/18 0737  . nicotine (NICODERM CQ - dosed in mg/24 hours) patch 21 mg  21 mg Transdermal Daily [name], MD   21 mg at 12/22/18 0743  . QUEtiapine (SEROQUEL XR) 24 hr tablet 300 mg  300 mg Oral QHS ,  T, MD   300 mg at 12/21/18 2224  . Sofosbuvir-Velpatasvir 400-100 MG TABS 1 tablet  1 tablet Oral q1800 [name], MD   1 tablet at 12/22/18 1744  . thiamine (VITAMIN B-1) tablet 100 mg  100 mg Oral Daily [name], MD   100 mg at 12/22/18 0737  . zolpidem (AMBIEN) tablet 10 mg  10 mg Oral QHS , [name], MD   10 mg at 12/21/18 2224    Lab Results: No results found for this or any previous visit (from the past 73 hour(s)).  Blood Alcohol level:  Lab Results  Component Value Date   ETH <10 12/12/2018   ETH 205 (H) 96/03/5408    Metabolic Disorder Labs: Lab Results  Component Value Date   HGBA1C 6.4 (H) 12/13/2018   MPG 136.98 12/13/2018   MPG 116.89  05/05/2018   No results  found for: PROLACTIN Lab Results  Component Value Date   CHOL 131 12/13/2018   TRIG 167 (H) 12/13/2018   HDL 28 (L) 12/13/2018   CHOLHDL 4.7 12/13/2018   VLDL 33 12/13/2018   LDLCALC 70 12/13/2018   LDLCALC 69 05/05/2018    Physical Findings: AIMS:  , ,  ,  ,    CIWA:  CIWA-Ar Total: 0 COWS:     Musculoskeletal: Strength & Muscle Tone: within normal limits Gait & Station: normal Patient leans: N/A  Psychiatric Specialty Exam: Physical Exam  Nursing note and vitals reviewed. Constitutional: He appears well-developed and well-nourished.  HENT:  Head: Normocephalic and atraumatic.  Eyes: Pupils are equal, round, and reactive to light. Conjunctivae are normal.  Neck: Normal range of motion.  Cardiovascular: Regular rhythm and normal heart sounds.  Respiratory: Effort normal.  GI: Soft.  Musculoskeletal: Normal range of motion.  Neurological: He is alert.  Skin: Skin is warm and dry.  Psychiatric: He has a normal mood and affect. His behavior is normal. Judgment and thought content normal.    Review of Systems  Constitutional: Negative.   HENT: Negative.   Eyes: Negative.   Respiratory: Negative.   Cardiovascular: Negative.   Gastrointestinal: Negative.   Musculoskeletal: Negative.   Skin: Negative.   Neurological: Negative.   Psychiatric/Behavioral: Negative.     Blood pressure 104/83, pulse 74, temperature 97.7 F (36.5 C), temperature source Oral, resp. rate 16, height 6' (1.829 m), weight 127.9 kg, SpO2 95 %.Body mass index is 38.25 kg/m.  General Appearance: Casual  Eye Contact:  Good  Speech:  Clear and Coherent  Volume:  Normal  Mood:  Euthymic  Affect:  Congruent  Thought Process:  Goal Directed  Orientation:  Full (Time, Place, and Person)  Thought Content:  Logical  Suicidal Thoughts:  No  Homicidal Thoughts:  No  Memory:  Immediate;   Fair Recent;   Fair Remote;   Fair  Judgement:  Good  Insight:  Good  Psychomotor Activity:  Normal   Concentration:  Concentration: Fair  Recall:  [employer] of Knowledge:  Fair  Language:  Fair  Akathisia:  No  Handed:  Right  AIMS (if indicated):     Assets:  Desire for Improvement Housing  ADL's:  Intact  Cognition:  WNL  Sleep:  Number of Hours: 5.25     Treatment Plan Summary: Daily contact with patient to assess and evaluate symptoms and progress in treatment, Medication management and Plan Prescription sent to the pharmacy for 7-day supply of all of his medicines.  Prescriptions will be printed for 30-day supply.  Arrangements will be made for discharge tomorrow with follow-up in the community.  Alethia Berthold, MD 12/22/2018, 7:03 PM

## 2018-12-22 NOTE — Plan of Care (Signed)
Patient  aware of  discharge tomorrow  , returning home . Compliant  with medication given  denies suicidal ideations . Attending  unit programing   working on coping skills . Able to verbalize feeling of self . Understanding  information received . Problem: Coping: Goal: Coping ability will improve Outcome: Progressing   Problem: Health Behavior/Discharge Planning: Goal: Identification of resources available to assist in meeting health care needs will improve Outcome: Progressing   Problem: Medication: Goal: Compliance with prescribed medication regimen will improve Outcome: Progressing   Problem: Self-Concept: Goal: Ability to disclose and discuss suicidal ideas will improve Outcome: Progressing Goal: Will verbalize positive feelings about self Outcome: Progressing   Problem: Activity: Goal: Interest or engagement in leisure activities will improve Outcome: Progressing   Problem: Safety: Goal: Ability to disclose and discuss suicidal ideas will improve Outcome: Progressing   Problem: Self-Concept: Goal: Level of anxiety will decrease Outcome: Progressing

## 2018-12-22 NOTE — Unmapped (Signed)
Unable to reach patient's sister or the patient. Letter sent asking to have blood drawn next week. My phone number is included.

## 2018-12-23 NOTE — Progress Notes (Signed)
Recreation Therapy Notes  Date: 12/23/2018  Time: 9:30 am   Location: Craft room   Behavioral response: N/A   Intervention Topic: Happiness  Discussion/Intervention: Patient did not attend group.   Clinical Observations/Feedback:  Patient did not attend group.   Virlee Stroschein LRT/CTRS        Menelik Mcfarren 12/23/2018 10:24 AM

## 2018-12-23 NOTE — Progress Notes (Signed)
Recreation Therapy Notes  INPATIENT RECREATION TR PLAN  Patient Details Name: Ammaar Encina MRN: [id number] DOB: [birth year] Today's Date: 12/23/2018  Rec Therapy Plan Is patient appropriate for Therapeutic Recreation?: Yes Treatment times per week: at least 3 Estimated Length of Stay: 5-7 days TR Treatment/Interventions: Group participation (Comment)  Discharge Criteria Pt will be discharged from therapy if:: Discharged Treatment plan/goals/alternatives discussed and agreed upon by:: Patient/family  Discharge Summary Short term goals set: Patient will engage in groups without prompting or encouragement from LRT x3 group sessions within 5 recreation therapy group sessions Short term goals met: Not met Reason goals not met: Patient did not attend any groups Therapeutic equipment acquired: N/A Reason patient discharged from therapy: Discharge from hospital Pt/family agrees with progress & goals achieved: Yes Date patient discharged from therapy: 12/23/18   Levent Kornegay 12/23/2018, 11:39 AM

## 2018-12-23 NOTE — Progress Notes (Signed)
Patient alert and oriented x 4. Ambulates unit with steady gait. Verbally denies SI/HI/AVH and pain. Patient discharged on above date and time. Verbalized understanding the discharge information provided to patient upon discharge. Patient departed unit with discharge paperwork, prescriptions, 7 day supply of medications and personal belongings. Picked up by his friend Ian [name] to go home and plans to follow up with [facility] tomorrow 12/24/18. No distress noted.

## 2018-12-23 NOTE — Progress Notes (Signed)
  [facility] Adult Case Management Discharge Plan :  Will you be returning to the same living situation after discharge:  Yes,  patient's home At discharge, do you have transportation home?: Yes,  pt friend Do you have the ability to pay for your medications: Yes,  medicaid  Release of information consent forms completed and in the chart;  Patient's signature needed at discharge.  Patient to Follow up at: Follow-up Information    [place] on 12/24/2018.   Why:  Please attend your appt on Friday, 12/24/18, at 2:30pm.  Please bring photo ID and your hospital discharge paperwork.  Contact information: [place] [address] [phone]           Next level of care provider has access to [place] and Suicide Prevention discussed: Yes,  pt gave permission to contact his friend.  SPE was completed.  Have you used any form of tobacco in the last 30 days? (Cigarettes, Smokeless Tobacco, Cigars, and/or Pipes): Yes  Has patient been referred to the Quitline?: Patient refused referral  Patient has been referred for addiction treatment: Yes  Rozann Lesches, LCSW 12/23/2018, 9:25 AM

## 2018-12-23 NOTE — Discharge Summary (Signed)
Physician Discharge Summary Note  Patient:  Ian [name] is an 58 y.o., male MRN:  [id number] DOB:  02/07/1961 Patient phone:  [phone] (home)  Patient address:   [address] [place] [address],  Total Time spent with patient: 45 minutes  Date of Admission:  12/13/2018 Date of Discharge: December 23, 2017  Reason for Admission: Admitted through the emergency room for worsening depression and suicidal ideation  Principal Problem: Bipolar I disorder, most recent episode depressed with anxious distress [facility]) Discharge Diagnoses: Principal Problem:   Bipolar I disorder, most recent episode depressed with anxious distress ([place]) Active Problems:   HTN (hypertension)   Alcohol use disorder, moderate, dependence ([place])   Dyslipidemia   Major depression, recurrent, chronic ([place])   Hepatitis C   Past Psychiatric History: Patient has a long history of depression as well as alcohol abuse.  Alcohol abuse has become better controlled of late although he continues to have depression and anxiety with some OCD-like features  Past Medical History:  Past Medical History:  Diagnosis Date  . Anxiety   . Barrett esophagus   . Cancer ([place])   . Coronary artery disease   . Depression   . GERD (gastroesophageal reflux disease)   . Hypertension   . Pre-diabetes   . Stroke [facility]) 2014   "mini-stroke" per patient    Past Surgical History:  Procedure Laterality Date  . ANGIOPLASTY    . APPENDECTOMY    . CARDIAC CATHETERIZATION    . CHOLECYSTECTOMY    . WRIST SURGERY Right    age 42   Family History:  Family History  Problem Relation Age of Onset  . Stroke Father   . Leukemia Father   . Breast cancer Sister    Family Psychiatric  History: See previous notes Social History:  Social History   Substance and Sexual Activity  Alcohol Use Yes   Comment: denies being a daily drinker but did have alcohol today     Social History   Substance and Sexual Activity  Drug Use Yes   . Types: Cocaine, Marijuana   Comment: used cocaine today; denies smoking marijuana for years    Social History   Socioeconomic History  . Marital status: Single    Spouse name: Not on file  . Number of children: Not on file  . Years of education: Not on file  . Highest education level: Not on file  Occupational History  . Not on file  Social Needs  . Financial resource strain: Not on file  . Food insecurity:    Worry: Not on file    Inability: Not on file  . Transportation needs:    Medical: Not on file    Non-medical: Not on file  Tobacco Use  . Smoking status: Current Every Day Smoker    Packs/day: 1.00    Types: Cigarettes  . Smokeless tobacco: Never Used  Substance and Sexual Activity  . Alcohol use: Yes    Comment: denies being a daily drinker but did have alcohol today  . Drug use: Yes    Types: Cocaine, Marijuana    Comment: used cocaine today; denies smoking marijuana for years  . Sexual activity: Not on file  Lifestyle  . Physical activity:    Days per week: Not on file    Minutes per session: Not on file  . Stress: Not on file  Relationships  . Social connections:    Talks on phone: Not on file  Gets together: Not on file    Attends religious service: Not on file    Active member of club or organization: Not on file    Attends meetings of clubs or organizations: Not on file    Relationship status: Not on file  Other Topics Concern  . Not on file  Social History Narrative  . Not on file    Hospital Course: Patient was admitted to the psychiatric ward.  Restarted on appropriate medication.  Engaged in appropriate groups and activities.  Patient showed good insight and was willing to engage in treatment appropriately.  He was able to discuss some of the major social challenges he faces at home.  He was put back on medication which he took without complaint or complication.  Patient is being discharged today and has follow-up arranged at [facility].  Reports  that symptoms are greatly improved.  Mood is feeling upbeat.  Denies any suicidal ideation no sign of psychosis.  He has continued on his medicine for hepatitis C during his hospital stay as well his blood pressure medicine  Physical Findings: AIMS:  , ,  ,  ,    CIWA:  CIWA-Ar Total: 0 COWS:     Musculoskeletal: Strength & Muscle Tone: within normal limits Gait & Station: normal Patient leans: N/A  Psychiatric Specialty Exam: Physical Exam  Nursing note and vitals reviewed. Constitutional: He appears well-developed and well-nourished.  HENT:  Head: Normocephalic and atraumatic.  Eyes: Pupils are equal, round, and reactive to light. Conjunctivae are normal.  Neck: Normal range of motion.  Cardiovascular: Regular rhythm and normal heart sounds.  Respiratory: Effort normal. No respiratory distress.  GI: Soft.  Musculoskeletal: Normal range of motion.  Neurological: He is alert.  Skin: Skin is warm and dry.  Psychiatric: He has a normal mood and affect. His speech is normal and behavior is normal. Judgment and thought content normal. Cognition and memory are normal.    Review of Systems  Constitutional: Negative.   HENT: Negative.   Eyes: Negative.   Respiratory: Negative.   Cardiovascular: Negative.   Gastrointestinal: Negative.   Musculoskeletal: Negative.   Skin: Negative.   Neurological: Negative.   Psychiatric/Behavioral: Negative.     Blood pressure 135/79, pulse 75, temperature 97.7 F (36.5 C), temperature source Oral, resp. rate 18, height 6' (1.829 m), weight 127.9 kg, SpO2 99 %.Body mass index is 38.25 kg/m.  General Appearance: Casual  Eye Contact:  Good  Speech:  Clear and Coherent  Volume:  Normal  Mood:  Euthymic  Affect:  Congruent  Thought Process:  Coherent  Orientation:  Full (Time, Place, and Person)  Thought Content:  Logical  Suicidal Thoughts:  No  Homicidal Thoughts:  No  Memory:  Immediate;   Fair Recent;   Fair Remote;   Fair  Judgement:   Fair  Insight:  Fair  Psychomotor Activity:  Decreased  Concentration:  Concentration: Fair  Recall:  [employer] of Knowledge:  Fair  Language:  Fair  Akathisia:  No  Handed:  Right  AIMS (if indicated):     Assets:  Desire for Improvement Housing Physical Health Social Support  ADL's:  Intact  Cognition:  WNL  Sleep:  Number of Hours: 5.25     Have you used any form of tobacco in the last 30 days? (Cigarettes, Smokeless Tobacco, Cigars, and/or Pipes): Yes  Has this patient used any form of tobacco in the last 30 days? (Cigarettes, Smokeless Tobacco, Cigars, and/or Pipes) Yes, No  Blood Alcohol level:  Lab Results  Component Value Date   ETH <10 12/12/2018   ETH 205 (H) 66/29/4765    Metabolic Disorder Labs:  Lab Results  Component Value Date   HGBA1C 6.4 (H) 12/13/2018   MPG 136.98 12/13/2018   MPG 116.89 05/05/2018   No results found for: PROLACTIN Lab Results  Component Value Date   CHOL 131 12/13/2018   TRIG 167 (H) 12/13/2018   HDL 28 (L) 12/13/2018   CHOLHDL 4.7 12/13/2018   VLDL 33 12/13/2018   LDLCALC 70 12/13/2018   LDLCALC 69 05/05/2018    See Psychiatric Specialty Exam and Suicide Risk Assessment completed by Attending Physician prior to discharge.  Discharge destination:  Home  Is patient on multiple antipsychotic therapies at discharge:  No   Has Patient had three or more failed trials of antipsychotic monotherapy by history:  No  Recommended Plan for Multiple Antipsychotic Therapies: NA  Discharge Instructions    Diet - low sodium heart healthy   Complete by:  As directed    Increase activity slowly   Complete by:  As directed      Allergies as of 12/23/2018      Reactions   Fluoxetine Swelling   Anxiety, itching   Hydrocodone-acetaminophen Itching   Mirtazapine Other (See Comments)   nightmares   Gabapentin Other (See Comments)   Bad dreams and felt bad   Tramadol Other (See Comments)      Medication List    STOP taking  these medications   ADVAIR DISKUS 250-50 MCG/DOSE Aepb Generic drug:  Fluticasone-Salmeterol   aspirin 81 MG EC tablet Replaced by:  aspirin 81 MG chewable tablet   gabapentin 300 MG capsule Commonly known as:  NEURONTIN   hydrOXYzine 50 MG tablet Commonly known as:  ATARAX/VISTARIL     TAKE these medications     Indication  albuterol 108 (90 Base) MCG/ACT inhaler Commonly known as:  PROVENTIL HFA;VENTOLIN HFA Inhale 2 puffs into the lungs every 6 (six) hours as needed for wheezing or shortness of breath.  Indication:  Asthma   aspirin 81 MG chewable tablet Chew 1 tablet (81 mg total) by mouth daily. Replaces:  aspirin 81 MG EC tablet  Indication:  Inflammation   atorvastatin 40 MG tablet Commonly known as:  LIPITOR Take 1 tablet (40 mg total) by mouth daily at 6 PM.  Indication:  High Amount of Fats in the Blood   busPIRone 10 MG tablet Commonly known as:  BUSPAR Take 1 tablet (10 mg total) by mouth 3 (three) times daily.  Indication:  Anxiety Disorder   cloNIDine 0.1 MG tablet Commonly known as:  CATAPRES Take 1 tablet (0.1 mg total) by mouth 2 (two) times daily as needed (BP Systolic > 465 or Disastolic > 035).  Indication:  High Blood Pressure Disorder   escitalopram 10 MG tablet Commonly known as:  LEXAPRO Take 1 tablet (10 mg total) by mouth daily.  Indication:  Generalized Anxiety Disorder   famotidine 40 MG tablet Commonly known as:  PEPCID Take 1 tablet (40 mg total) by mouth at bedtime.  Indication:  Gastroesophageal Reflux Disease   ibuprofen 600 MG tablet Commonly known as:  ADVIL,MOTRIN Take 1 tablet (600 mg total) by mouth every 6 (six) hours as needed for moderate pain.  Indication:  Migraine Headache   lisinopril 20 MG tablet Commonly known as:  PRINIVIL,ZESTRIL Take 1 tablet (20 mg total) by mouth daily.  Indication:  High Blood Pressure Disorder  Melatonin 5 MG Tabs Take 1 tablet (5 mg total) by mouth at bedtime.  Indication:  Trouble  Sleeping   mometasone-formoterol 200-5 MCG/ACT Aero Commonly known as:  DULERA Inhale 2 puffs into the lungs 2 (two) times daily.  Indication:  Asthma   QUEtiapine 300 MG 24 hr tablet Commonly known as:  SEROQUEL XR Take 1 tablet (300 mg total) by mouth at bedtime.  Indication:  Major Depressive Disorder   Sofosbuvir-Velpatasvir 400-100 MG Tabs Commonly known as:  EPCLUSA Take 1 tablet by mouth daily at 6 PM.  Indication:  Hepatitis due to Hepatitis C Virus, Genotype 1   thiamine 100 MG tablet Take 1 tablet (100 mg total) by mouth daily.  Indication:  Deficiency of Vitamin B1   zolpidem 10 MG tablet Commonly known as:  AMBIEN Take 1 tablet (10 mg total) by mouth at bedtime.  Indication:  [place] on 12/24/2018.   Why:  Please attend your appt on Friday, 12/24/18, at 2:30pm.  Please bring photo ID and your hospital discharge paperwork.  Contact information: [place] [address] [phone]           Follow-up recommendations:  Activity:  Activity as tolerated Diet:  Follow-up regular diet heart healthy if possible Other:  Go to [facility] continue current medicine continue outpatient treatment maintain sobriety  Comments: Patient also worked with social work to improve some of his circumstances by trying to get his electricity turned back on and getting a replacement space heater  Signed: Alethia Berthold, MD 12/23/2018, 11:03 AM

## 2018-12-23 NOTE — Progress Notes (Signed)
D - Patient was in the day room upon arrival to the unit. Patient was pleasant during medication administration and assessment. Patient denies SI/HI/AVH, pain. Patient stated he had very little anxiety and depression. Patient stated, "I wouldn't have made it through the holidays if it wasn't for you guys here. I am ready to leave, this is the best I have felt in a long time."   A - Patient compliant with medication administration per MD orders and procedures on the unit. Patient provided with education. Patient provided with support and encouragement. Patient informed to let staff now if there are any issues or problems on the unit.   R - Patient verbally contracts for safety with this Probation officer. Patient calm and cooperative on the unit. Patient being monitored Q 15 minutes for safety per unit protocol. Patient remains safe at this time.

## 2018-12-23 NOTE — Plan of Care (Signed)
Patient was compliant with medication administration per MD orders and procedures on the unit. Patient stated to this writer, "I am thankful for you guys, I wouldn't have made it through the holidays without you." Patient also sated he is ready to discharge and is feeling good.   Problem: Medication: Goal: Compliance with prescribed medication regimen will improve Outcome: Progressing   Problem: Self-Concept: Goal: Will verbalize positive feelings about self Outcome: Progressing

## 2018-12-27 NOTE — Unmapped (Addendum)
Reason for call: Pt was returning phone call    Hep C  Genotype:1a????(Ref Att 08/03/18 pg 5)  Treatment:??Epclusa x 12 wks  Start date:10/24/18  Fibrosis:F3 (11.2 kPa) on 09/20/18  TW # 9    Pt was returning call to the office. Pt stated he just got out of the hospital. Pt was admitted on Dec 23rd and discharged Jan 2nd from [facility]. Pt stated he was having bad thoughts. Pt stated he took his Epclusa with him and has not missed any doses. Pt reports his only new medications are Ambien 10 mg at night as needed, or Melatonin (did not know the dosage), and Buspar 10 mg TID. Both checked on [facility]omp and [place] and no interactions noted. Notified pt Kriste Basque RN was trying to reach him to have HCV RNA completed. Pt stated he can go to [facility] this week and have it drawn. Lab orders were faxed to [facility]: [address] [place], [place] [address] Phone: [phone] Fax # [phone].   December 31, 2018 9:13 AM  Follow up reminder call made to pt. Pt stated he is planning on going to [facility] today. Pt stated he has made some phone calls to [facility] to make an apt but they have not returned his call. Notified pt they are open today from 8-5 pm and are closed from 12-1 pm for lunch. Provided the address and phone number to pt. Pt stated he will give me a call on Monday to let me know if he had them done.       Vertell Limber RN, BSN  Nursing Care Coordinator   Pharmacy Adult GI Medicine  [facility]  [address]   [place], [place] [address]  [phone]

## 2019-01-03 NOTE — Unmapped (Signed)
Follow-Up Counseling for HCV Treatment      Regimen: Epclusa (sofosbuvir/velpatasvir 400/100mg ) x 12 weeks  Start Date: 10/24/18  Completed Treatment Week #10    Pharmacy: [facility] [phone]     Following topics were reviewed during the phone call:  Patient Counseling    Counseled the patient on the following:  doses and administration discussed, possible adverse effects and management discussed, possible drug and prescription drug interactions discussed, possible drug and OTC drug and food interactions discussed, lab monitoring and follow-up discussed, therapeutic rationale discussed, cost of medications and cost implications discussed, adherence and missed doses discussed, pharmacy contact information discussed         1. Medication administration - Takes Epclusa every evening at 7:00 PM.     2. Importance of adherence -   Medication Adherence    Patient reported X missed doses in the last month:  0  Specialty Medication:  Epclusa  Patient is on additional specialty medications:  No  Patient is on more than two specialty medications:  No  Any gaps in refill history greater than 2 weeks in the last 3 months:  no  Demonstrates understanding of importance of adherence:  yes  Informant:  patient  Reliability of informant:  reliable  Provider-estimated medication adherence level:  90-100%  Patient is at risk for Non-Adherence:  No  Reasons for non-adherence:  no problems identified      Adherence tools used:  alarm, cell phone             Pt was at the grocery store and unable to provide pill count. Pt denies missing any doses.     3. Side effects - No new side effects to report.  Adverse Effects        *All other systems reviewed and are negative         4. Drug-drug interaction -Pt denies starting any new medication since last conversation. Pt denies any alcohol use. Pt was taking Famotidine (2) 10 mg pills at 10:00 PM. Notified pt Jane, CPP instructed him to take it at the same time he takes [place]. Pt verbalized understanding. Pt provided teach back and stated take the Pepcid and the Epclusa at 7:00 PM at the same time.  Pt reports he discontinued Omeprazole 20 mg.   Drug Interactions    Drug interactions evaluated:  yes  Clinically relevant drug interactions identified:  yes   Interactions list:  Famotidine 10 mg (1-2 tablets)- may decrease efficacy of Epclusa   Drug management plan:  We discussed the mechanism of drug-drug interaction with acid lowering agents such as Famotidine may decrease the absorption of Epclusa.  Instructed to take Epclusa and Famotidine 10 mg (1-2 tablets) at the same. Pt advised to discontinue omeprazole 20 mg and use Famotidine instead as pt thought this would be easier for him to adhere to.             5. Follow up - Has follow up appointment scheduled in HCV treatment clinic on 01/19/19 @ 2:00 PM with Owens Shark, DNP.  Any barriers to coming to appointment? Yes, coordinating transportation. Pt requested a reminder call a week prior to apt.  Informed patient that there is still a small chance of relapse after finishing the treatment. Stressed importance of follow up 3 months post treatment to assess for cure.    All questions were answered.    Vertell Limber RN, BSN  Nursing Care Coordinator   Pharmacy Adult  GI Medicine  [facility]  [address]   [place], [place] [address]  [phone]    January 03, 2019 1:53 PM

## 2019-01-04 NOTE — Unmapped (Addendum)
-----   Message from [facility] [place] sent at 01/03/2019  4:29 PM EST -----  Regarding: lab  Eylin Pontarelli,    Please let Mr. Mussa know that his HCV RNA level at this point into his treatment is not detected indicating a positive response to his treatment so far. He needs to follow up as scheduled with me the end of this month to have lab work repeat at the [facility]. He needs to continue treatment as prescribed.     Thank you ~     Dawn   ===============================================  4:39 PM I called the patient but got the voice mail box. I left my number. I will try again tomorrow. I tried to call his sister, but the call went straight to recording about not being able to make the call at this time.

## 2019-01-19 ENCOUNTER — Ambulatory Visit: Admit: 2019-01-19 | Discharge: 2019-01-19 | Payer: MEDICAID | Attending: Family | Primary: Family

## 2019-01-19 DIAGNOSIS — B182 Chronic viral hepatitis C: Principal | ICD-10-CM

## 2019-01-28 ENCOUNTER — Other Ambulatory Visit: Payer: Self-pay

## 2019-01-28 ENCOUNTER — Emergency Department
Admission: EM | Admit: 2019-01-28 | Discharge: 2019-01-28 | Disposition: A | Discharge status: Home / Self Care | Payer: Medicaid Other | Attending: Emergency Medicine | Admitting: Emergency Medicine

## 2019-01-28 DIAGNOSIS — R42 Dizziness and giddiness: Secondary | ICD-10-CM | POA: Diagnosis present

## 2019-01-28 DIAGNOSIS — Z5321 Procedure and treatment not carried out due to patient leaving prior to being seen by health care provider: Secondary | ICD-10-CM | POA: Insufficient documentation

## 2019-01-28 LAB — BASIC METABOLIC PANEL
ANION GAP: 8 (ref 5–15)
BUN: 16 mg/dL (ref 6–20)
CO2: 26 mmol/L (ref 22–32)
Calcium: 9.4 mg/dL (ref 8.9–10.3)
Chloride: 102 mmol/L (ref 98–111)
Creatinine, Ser: 0.73 mg/dL (ref 0.61–1.24)
GFR calc Af Amer: >60 mL/min (ref >60)
GFR calc non Af Amer: >60 mL/min (ref >60)
Glucose, Bld: 122 mg/dL — ABNORMAL HIGH (ref 70–99)
Potassium: 4.3 mmol/L (ref 3.5–5.1)
Sodium: 136 mmol/L (ref 135–145)

## 2019-01-28 LAB — CBC
HCT: 49.5 % (ref 39.0–52.0)
Hemoglobin: 16.7 g/dL (ref 13.0–17.0)
MCH: 30.2 pg (ref 26.0–34.0)
MCHC: 33.7 g/dL (ref 30.0–36.0)
MCV: 89.5 fL (ref 80.0–100.0)
NRBC: 0 % (ref 0.0–0.2)
Platelets: 196 10*3/uL (ref 150–400)
RBC: 5.53 MIL/uL (ref 4.22–5.81)
RDW: 13.1 % (ref 11.5–15.5)
WBC: 12.9 10*3/uL — AB (ref 4.0–10.5)

## 2019-01-28 NOTE — ED Triage Notes (Signed)
C/o dizzy spells multiple times during the day. Pt alert and oriented X4, active, cooperative, pt in NAD. RR even and unlabored, color WNL.

## 2019-01-28 NOTE — ED Notes (Signed)
No answer

## 2019-01-28 NOTE — ED Notes (Signed)
No answer in lobby.

## 2019-03-01 IMAGING — CT CT HEAD CODE STROKE
3 series · 16 of 47 positions shown, 19 images · non-contrast
Comparison: CT head 07/12/2017

CLINICAL DATA: Code stroke.  Left-sided weakness and facial droop

EXAM:
CT HEAD WITHOUT CONTRAST
TECHNIQUE: Contiguous axial images were obtained from the base of the skull
through the vertex without intravenous contrast.

[Series 3: head 5.0 st · axial · 0.45mm/px · z∈[-129,+21]mm · 10 of 36 slices shown, 13 images]
[im 3/36  brain]
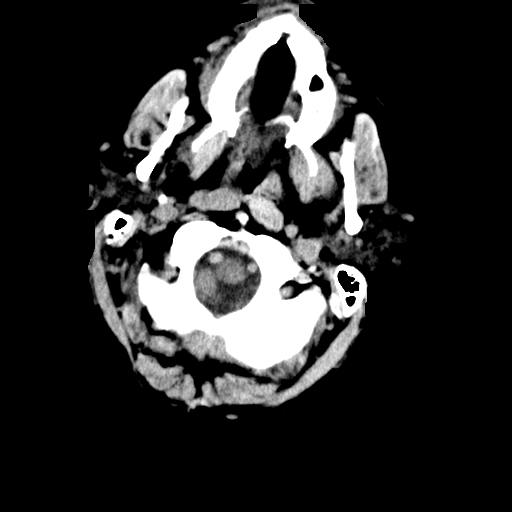
[im 3/36  bone]
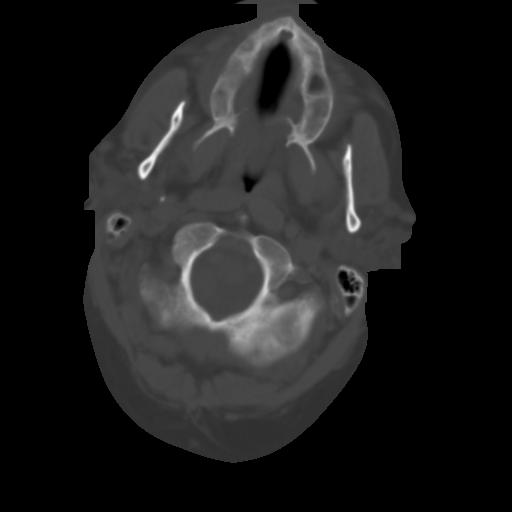
[im 7/36  brain]
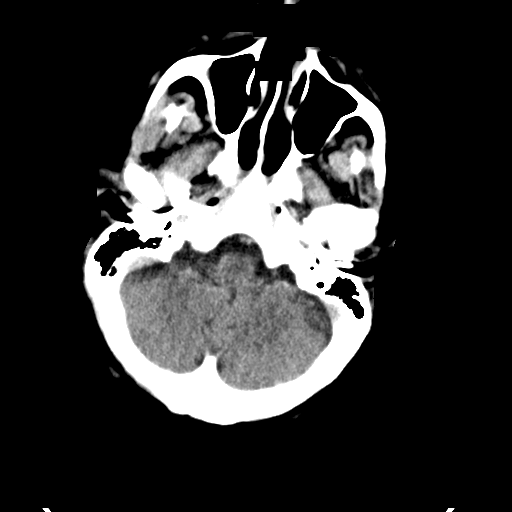
[im 10/36  brain]
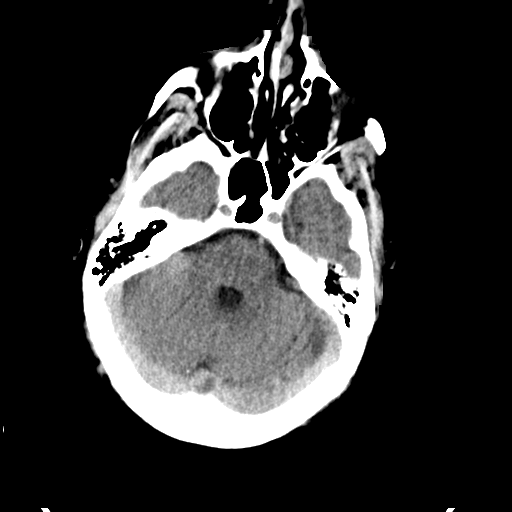
[im 13/36  brain]
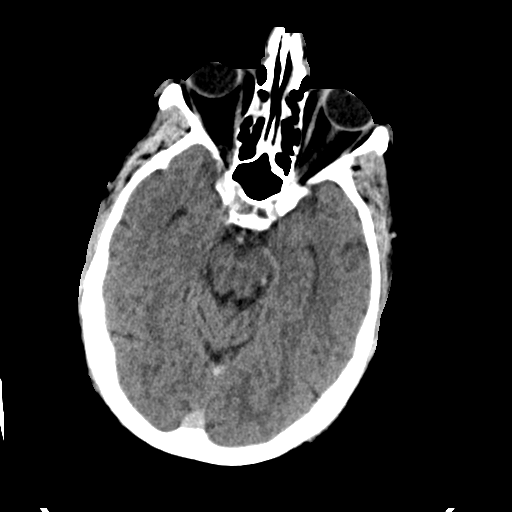
[im 16/36  brain]
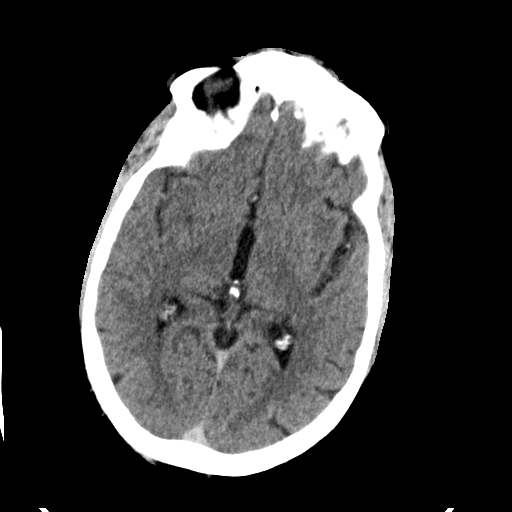
[im 16/36  bone]
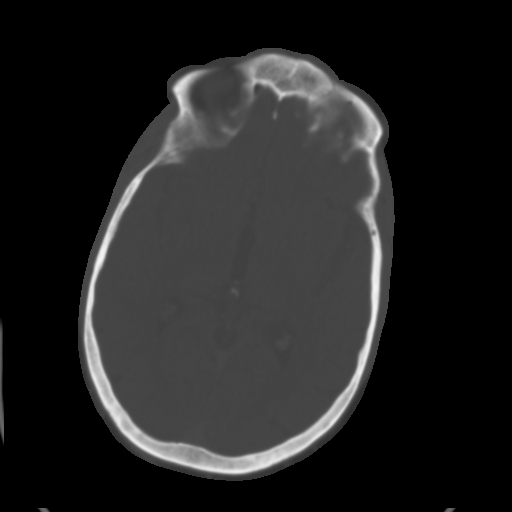
[im 20/36  brain]
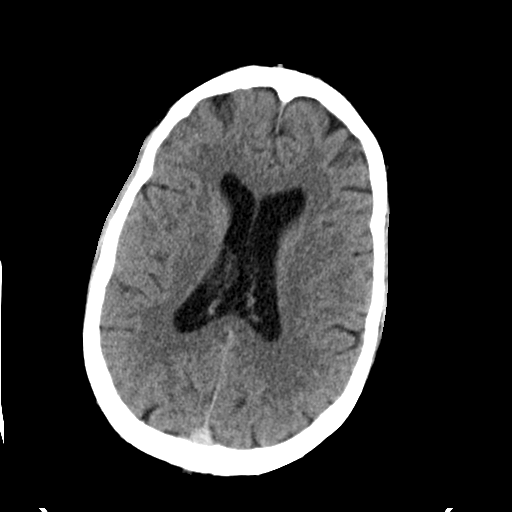
[im 23/36  brain]
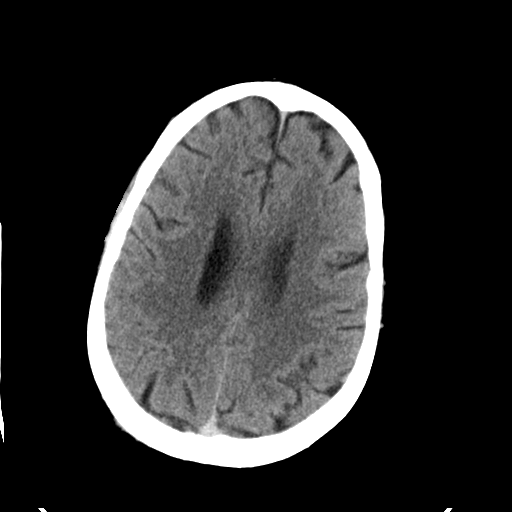
[im 27/36  brain]
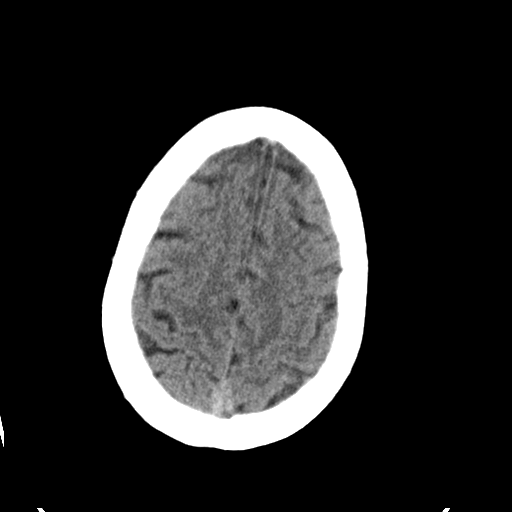
[im 29/36  brain]
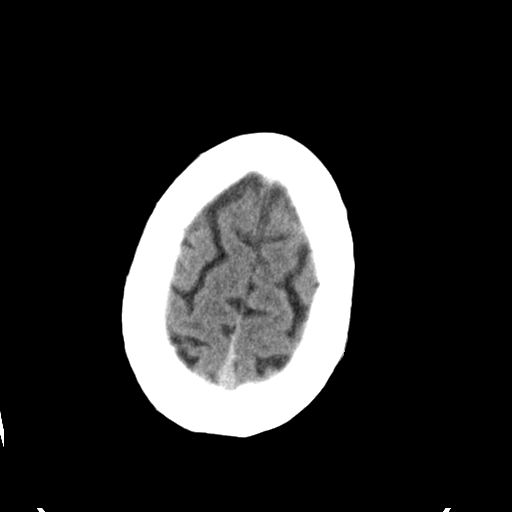
[im 29/36  bone]
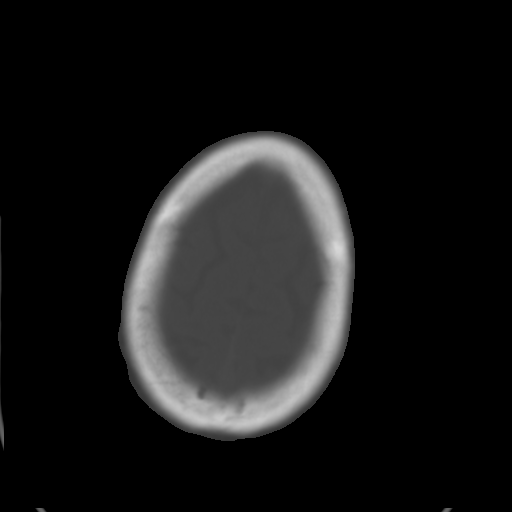
[im 33/36  brain]
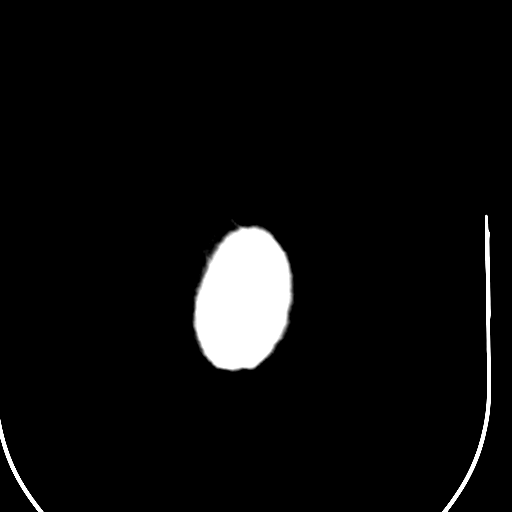

[Series 5: head 3.0 cor st · coronal · 0.35mm/px · 3 of 74 slices shown]
[im 25/74  brain]
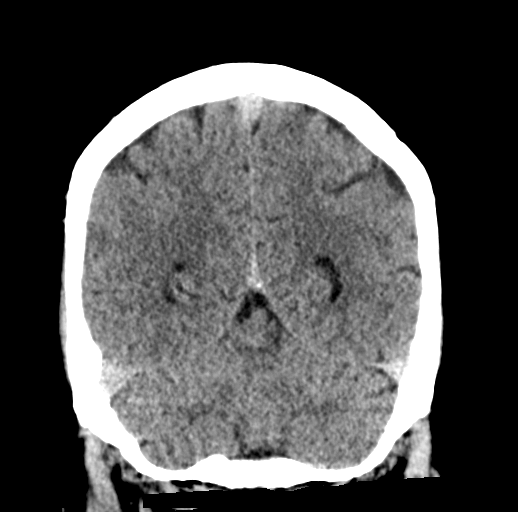
[im 33/74  brain]
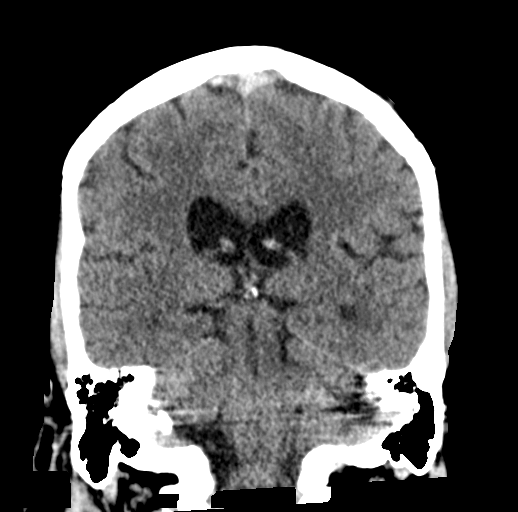
[im 41/74  brain]
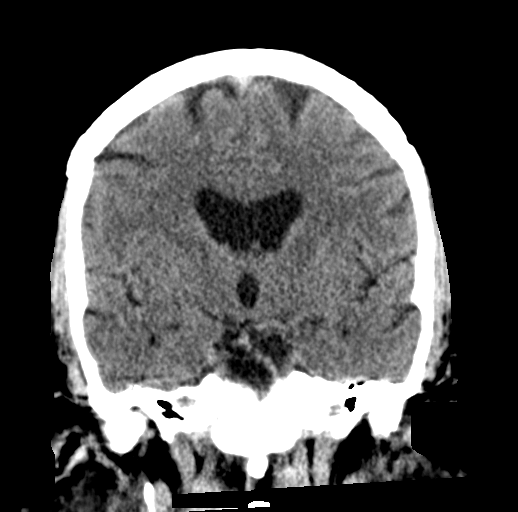

[Series 6: head 3.0 sag st · sagittal · 0.35mm/px · 3 of 63 slices shown]
[im 21/63  brain]
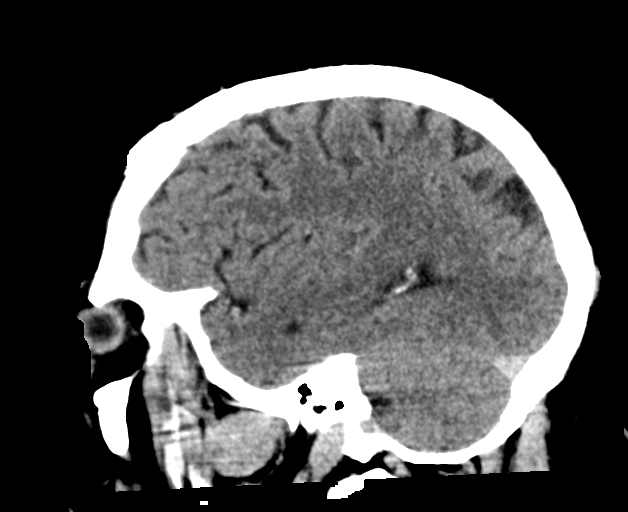
[im 32/63  brain]
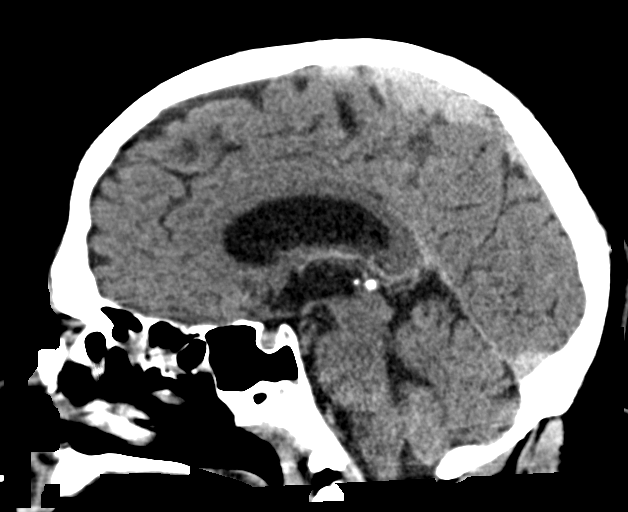
[im 42/63  brain]
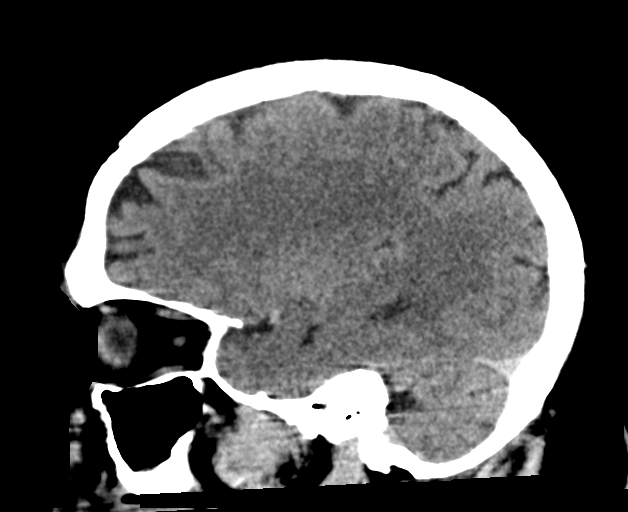

[16 of 47 positions shown; findings below may reference images not displayed]

FINDINGS: Brain: Mild ventricular enlargement is unchanged and most consistent
with mild atrophy. Negative for acute infarct, hemorrhage, or mass

Vascular: Diffusely increased density of intracranial arteries and
veins compatible with polycythemia. Negative for acute vascular
thrombosis

Skull: Negative

Sinuses/Orbits: Mild mucosal edema paranasal sinuses.

Other: None

ASPECTS (Alberta Stroke Program Early CT Score)

- Ganglionic level infarction (caudate, lentiform nuclei, internal
capsule, insula, M1-M3 cortex): 7

- Supraganglionic infarction (M4-M6 cortex): 3

Total score (0-10 with 10 being normal): 10
IMPRESSION: 1. No acute intracranial abnormality
2. ASPECTS is 10
3. These results were called by telephone at the time of
interpretation on 05/04/2018 at [DATE] to Dr. TA JANSEN ,
who verbally acknowledged these results.

## 2019-03-01 IMAGING — MR MR HEAD W/O CM
10 of 11 series · 42 of 48 positions shown · non-contrast
Comparison: 05/04/2018 CT head, CTA head, CT perfusion head.
09/01/2016 MRI head.

CLINICAL DATA: 56 y/o M; new left-sided arm and leg weakness.
Altered mental status.

EXAM:
MRI HEAD WITHOUT CONTRAST
TECHNIQUE: Multiplanar, multiecho pulse sequences of the brain and surrounding
structures were obtained without intravenous contrast.

[Series 5001: DWI · axial · 4.0mm · 0.88mm/px · z∈[-82,+61]mm · 6 of 74 slices shown (1 of 4)]
[im 1/74]
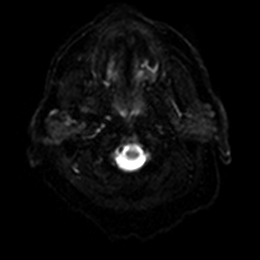
[im 15/74]
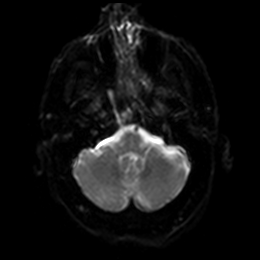
[im 30/74]
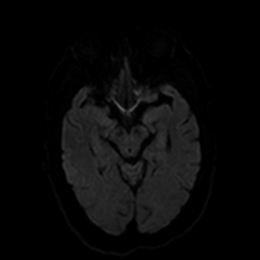
[im 44/74]
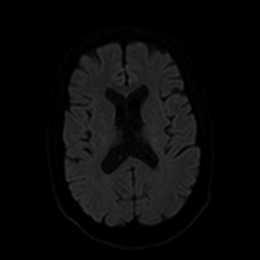
[im 59/74]
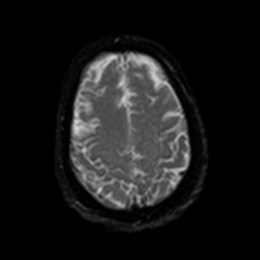
[im 74/74]
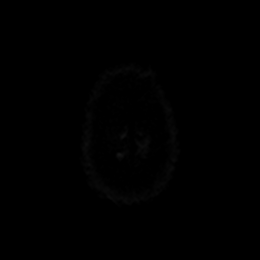

[Series 6001: DWI · axial · 4.0mm · 0.88mm/px · z∈[-82,+61]mm · 4 of 37 slices shown (2 of 4)]
[im 1/37]
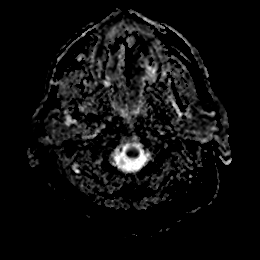
[im 13/37]
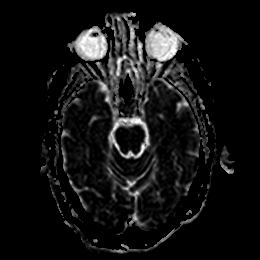
[im 25/37]
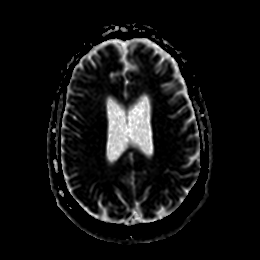
[im 37/37]
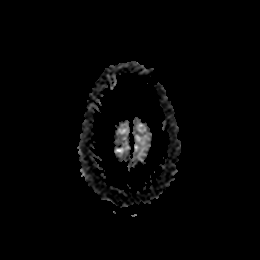

[Series 7001: DWI · coronal · 4.0mm · 0.88mm/px · 7 of 66 slices shown (3 of 4)]
[im 1/66]
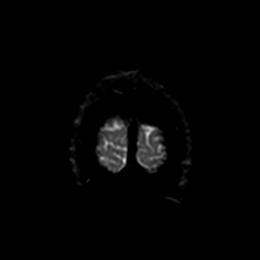
[im 11/66]
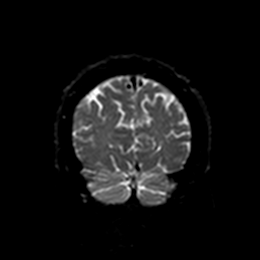
[im 22/66]
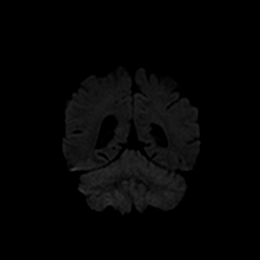
[im 33/66]
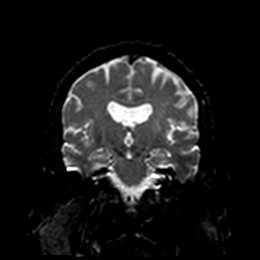
[im 44/66]
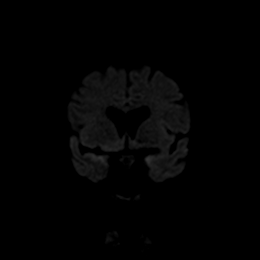
[im 55/66]
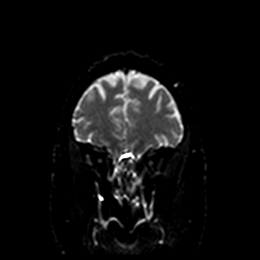
[im 66/66]
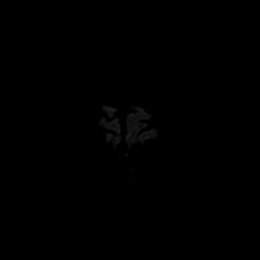

[Series 8001: DWI · coronal · 4.0mm · 0.88mm/px · 3 of 33 slices shown (4 of 4)]
[im 1/33]
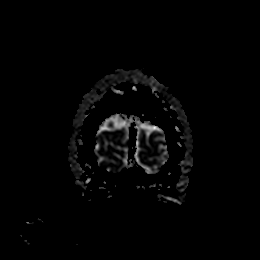
[im 17/33]
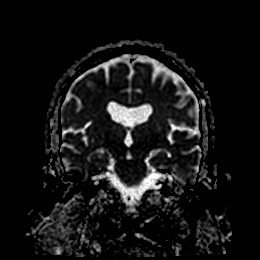
[im 33/33]
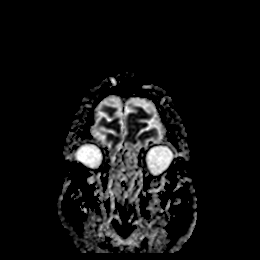

[Series 9001: T1 · sagittal · 5.0mm · 0.75mm/px · 2 of 23 slices shown]
[im 1/23]
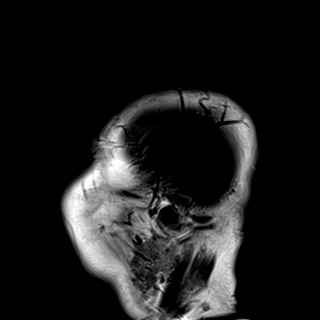
[im 23/23]
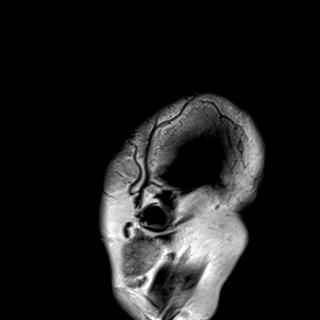

[T2 · axial · 5.0mm · 0.69mm/px · z∈[-83,+72]mm · 3 of 27 slices shown]
[im 1/27]
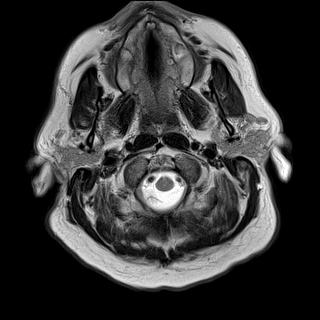
[im 14/27]
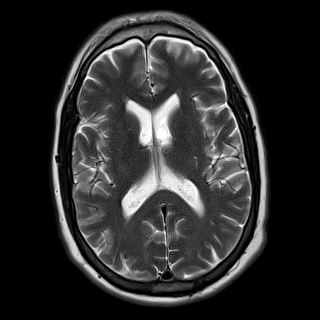
[im 27/27]
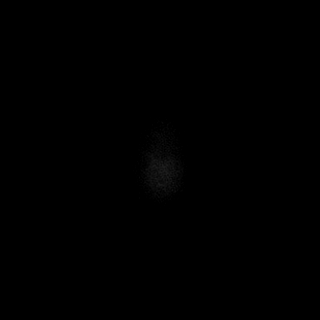

[FLAIR · axial · 5.0mm · 0.43mm/px · z∈[-81,+74]mm · 3 of 27 slices shown]
[im 1/27]
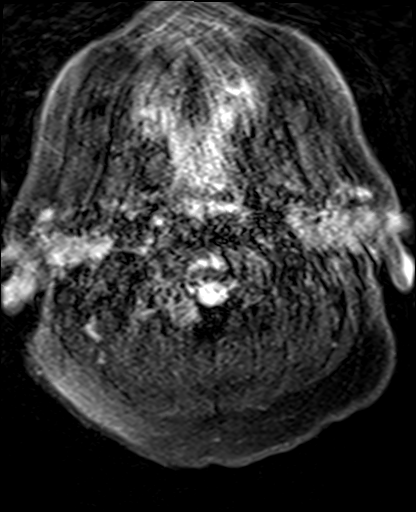
[im 14/27]
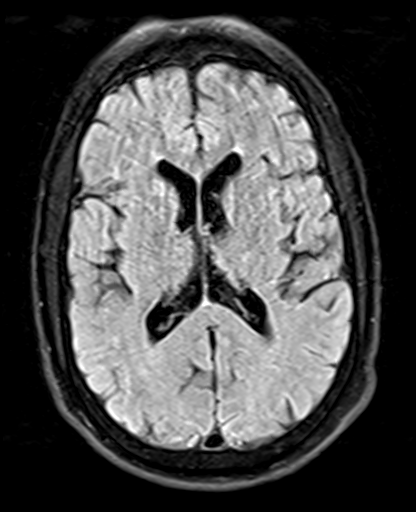
[im 27/27]
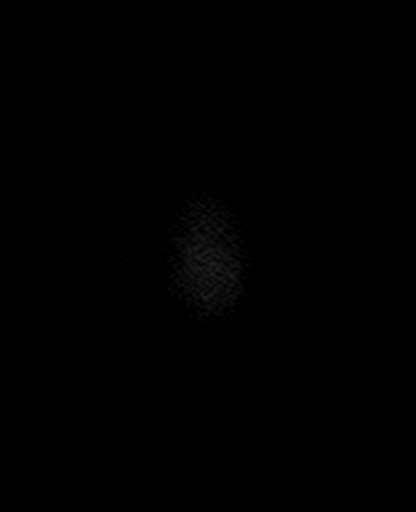

[swi_images · axial · 3.0mm · 0.86mm/px · z∈[-91,+85]mm · 6 of 60 slices shown]
[im 1/60]
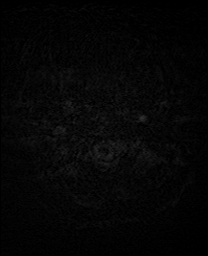
[im 12/60]
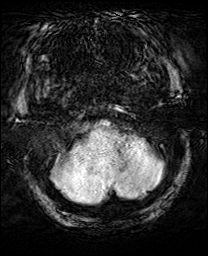
[im 24/60]
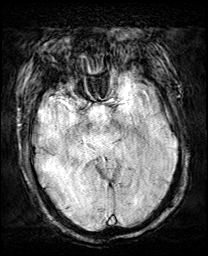
[im 36/60]
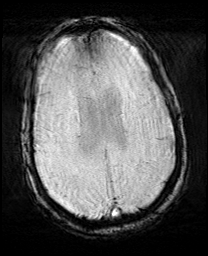
[im 48/60]
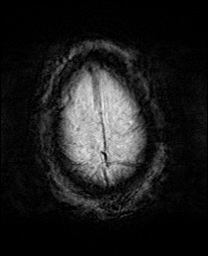
[im 60/60]
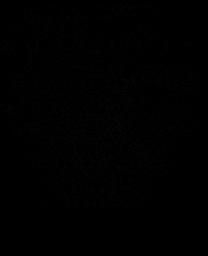

[mip_images(sw) · axial · 24.0mm · 0.86mm/px · z∈[-81,+74]mm · 5 of 53 slices shown]
[im 1/53]
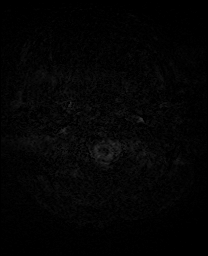
[im 14/53]
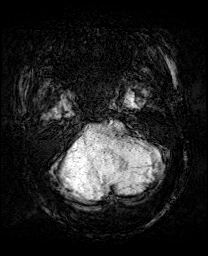
[im 27/53]
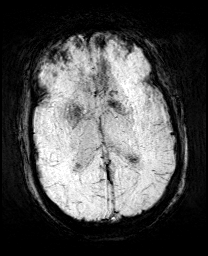
[im 40/53]
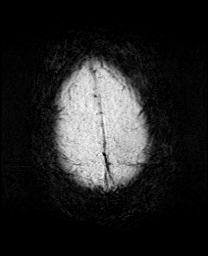
[im 53/53]
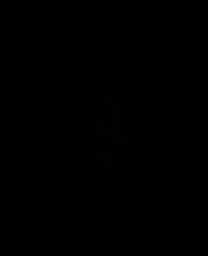

[T2 post-contrast · coronal · 5.0mm · 0.72mm/px · 3 of 28 slices shown]
[im 1/28]
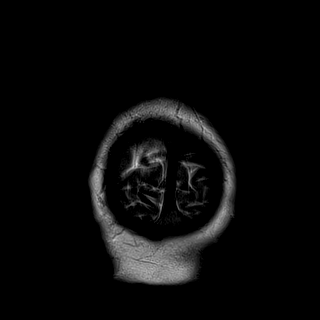
[im 14/28]
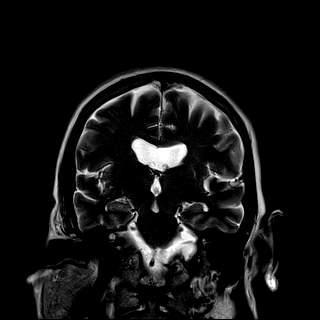
[im 28/28]
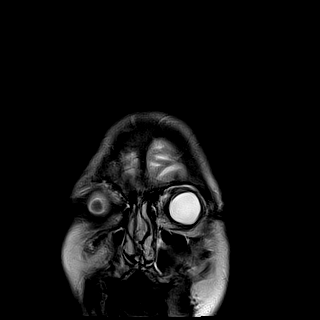

[42 of 48 positions shown; findings below may reference images not displayed]

FINDINGS: Brain: Motion degradation on multiple sequences. No acute
infarction, hemorrhage, hydrocephalus, extra-axial collection or
mass lesion.

Vascular: Normal flow voids.

Skull and upper cervical spine: Normal marrow signal.

Sinuses/Orbits: Moderate paranasal sinus mucosal thickening. No
abnormal signal of mastoid air cells. Orbits are unremarkable.

Other: None.
IMPRESSION: 1. Motion degradation of several sequences.
2. No acute intracranial abnormality identified.
3. Moderate paranasal sinus disease.

By: [employer] M.D.

## 2019-04-27 ENCOUNTER — Institutional Professional Consult (permissible substitution): Admit: 2019-04-27 | Discharge: 2019-04-28 | Payer: MEDICAID | Attending: Family | Primary: Family

## 2019-04-27 DIAGNOSIS — K740 Hepatic fibrosis: Secondary | ICD-10-CM

## 2019-04-27 DIAGNOSIS — R768 Other specified abnormal immunological findings in serum: Secondary | ICD-10-CM

## 2019-04-27 DIAGNOSIS — B182 Chronic viral hepatitis C: Principal | ICD-10-CM

## 2019-05-25 DIAGNOSIS — E1165 Type 2 diabetes mellitus with hyperglycemia: Secondary | ICD-10-CM | POA: Diagnosis present

## 2019-06-06 ENCOUNTER — Ambulatory Visit: Admit: 2019-06-06 | Discharge: 2019-06-07 | Payer: MEDICAID | Attending: Family | Primary: Family

## 2019-06-06 DIAGNOSIS — Z1159 Encounter for screening for other viral diseases: Principal | ICD-10-CM

## 2019-06-09 ENCOUNTER — Ambulatory Visit: Admit: 2019-06-09 | Discharge: 2019-06-09 | Payer: MEDICAID

## 2019-06-09 ENCOUNTER — Encounter
Admit: 2019-06-09 | Discharge: 2019-06-09 | Payer: MEDICAID | Attending: Certified Registered" | Primary: Certified Registered"

## 2019-06-09 DIAGNOSIS — B182 Chronic viral hepatitis C: Principal | ICD-10-CM

## 2019-06-09 DIAGNOSIS — Z08 Encounter for follow-up examination after completed treatment for malignant neoplasm: Principal | ICD-10-CM

## 2019-06-09 DIAGNOSIS — K740 Hepatic fibrosis: Secondary | ICD-10-CM

## 2019-06-09 DIAGNOSIS — R768 Other specified abnormal immunological findings in serum: Secondary | ICD-10-CM

## 2019-08-25 ENCOUNTER — Other Ambulatory Visit: Payer: Self-pay

## 2019-08-25 ENCOUNTER — Emergency Department: Payer: Medicaid Other

## 2019-08-25 ENCOUNTER — Inpatient Hospital Stay: Payer: Medicaid Other

## 2019-08-25 ENCOUNTER — Observation Stay
Admission: EM | Admit: 2019-08-25 | Discharge: 2019-08-26 | Disposition: A | Discharge status: Home / Self Care | Payer: Medicaid Other | Attending: Internal Medicine | Admitting: Internal Medicine

## 2019-08-25 ENCOUNTER — Encounter: Payer: Self-pay | Admitting: Emergency Medicine

## 2019-08-25 DIAGNOSIS — E119 Type 2 diabetes mellitus without complications: Secondary | ICD-10-CM | POA: Diagnosis not present

## 2019-08-25 DIAGNOSIS — E785 Hyperlipidemia, unspecified: Secondary | ICD-10-CM | POA: Diagnosis not present

## 2019-08-25 DIAGNOSIS — Z7951 Long term (current) use of inhaled steroids: Secondary | ICD-10-CM | POA: Diagnosis not present

## 2019-08-25 DIAGNOSIS — F17210 Nicotine dependence, cigarettes, uncomplicated: Secondary | ICD-10-CM | POA: Insufficient documentation

## 2019-08-25 DIAGNOSIS — Z6835 Body mass index (BMI) 35.0-35.9, adult: Secondary | ICD-10-CM | POA: Diagnosis not present

## 2019-08-25 DIAGNOSIS — I10 Essential (primary) hypertension: Secondary | ICD-10-CM | POA: Diagnosis not present

## 2019-08-25 DIAGNOSIS — I2510 Atherosclerotic heart disease of native coronary artery without angina pectoris: Secondary | ICD-10-CM | POA: Insufficient documentation

## 2019-08-25 DIAGNOSIS — Z8673 Personal history of transient ischemic attack (TIA), and cerebral infarction without residual deficits: Secondary | ICD-10-CM | POA: Insufficient documentation

## 2019-08-25 DIAGNOSIS — I639 Cerebral infarction, unspecified: Secondary | ICD-10-CM | POA: Diagnosis present

## 2019-08-25 DIAGNOSIS — Z79899 Other long term (current) drug therapy: Secondary | ICD-10-CM | POA: Diagnosis not present

## 2019-08-25 DIAGNOSIS — Z7982 Long term (current) use of aspirin: Secondary | ICD-10-CM | POA: Diagnosis not present

## 2019-08-25 DIAGNOSIS — Z823 Family history of stroke: Secondary | ICD-10-CM | POA: Diagnosis not present

## 2019-08-25 DIAGNOSIS — Z20828 Contact with and (suspected) exposure to other viral communicable diseases: Secondary | ICD-10-CM | POA: Diagnosis not present

## 2019-08-25 DIAGNOSIS — N179 Acute kidney failure, unspecified: Secondary | ICD-10-CM | POA: Insufficient documentation

## 2019-08-25 DIAGNOSIS — Z833 Family history of diabetes mellitus: Secondary | ICD-10-CM | POA: Diagnosis not present

## 2019-08-25 DIAGNOSIS — I6782 Cerebral ischemia: Secondary | ICD-10-CM | POA: Diagnosis not present

## 2019-08-25 DIAGNOSIS — Z885 Allergy status to narcotic agent status: Secondary | ICD-10-CM | POA: Diagnosis not present

## 2019-08-25 DIAGNOSIS — K219 Gastro-esophageal reflux disease without esophagitis: Secondary | ICD-10-CM | POA: Insufficient documentation

## 2019-08-25 DIAGNOSIS — R200 Anesthesia of skin: Principal | ICD-10-CM | POA: Insufficient documentation

## 2019-08-25 DIAGNOSIS — Z888 Allergy status to other drugs, medicaments and biological substances status: Secondary | ICD-10-CM | POA: Diagnosis not present

## 2019-08-25 DIAGNOSIS — R531 Weakness: Secondary | ICD-10-CM

## 2019-08-25 DIAGNOSIS — G319 Degenerative disease of nervous system, unspecified: Secondary | ICD-10-CM | POA: Insufficient documentation

## 2019-08-25 LAB — DIFFERENTIAL
Abs Immature Granulocytes: 0.05 10*3/uL (ref 0.00–0.07)
Basophils Absolute: 0 10*3/uL (ref 0.0–0.1)
Basophils Relative: 0 %
Eosinophils Absolute: 0.3 10*3/uL (ref 0.0–0.5)
Eosinophils Relative: 3 %
Immature Granulocytes: 1 %
Lymphocytes Relative: 30 %
Lymphs Abs: 3.2 10*3/uL (ref 0.7–4.0)
Monocytes Absolute: 0.5 10*3/uL (ref 0.1–1.0)
Monocytes Relative: 5 %
Neutro Abs: 6.6 10*3/uL (ref 1.7–7.7)
Neutrophils Relative %: 61 %

## 2019-08-25 LAB — PROTIME-INR
INR: 0.9 (ref 0.8–1.2)
Prothrombin Time: 12.1 seconds (ref 11.4–15.2)

## 2019-08-25 LAB — COMPREHENSIVE METABOLIC PANEL
ALT: 25 U/L (ref 0–44)
AST: 26 U/L (ref 15–41)
Albumin: 3.9 g/dL (ref 3.5–5.0)
Alkaline Phosphatase: 58 U/L (ref 38–126)
Anion gap: 11 (ref 5–15)
BUN: 25 mg/dL — ABNORMAL HIGH (ref 6–20)
CO2: 22 mmol/L (ref 22–32)
Calcium: 9.6 mg/dL (ref 8.9–10.3)
Chloride: 105 mmol/L (ref 98–111)
Creatinine, Ser: 1.34 mg/dL — ABNORMAL HIGH (ref 0.61–1.24)
GFR calc Af Amer: >60 mL/min (ref >60)
GFR calc non Af Amer: 58 mL/min — ABNORMAL LOW (ref >60)
Glucose, Bld: 240 mg/dL — ABNORMAL HIGH (ref 70–99)
Potassium: 5 mmol/L (ref 3.5–5.1)
Sodium: 138 mmol/L (ref 135–145)
Total Bilirubin: 0.6 mg/dL (ref 0.3–1.2)
Total Protein: 7.3 g/dL (ref 6.5–8.1)

## 2019-08-25 LAB — URINALYSIS, COMPLETE (UACMP) WITH MICROSCOPIC
Bacteria, UA: NONE SEEN
Bilirubin Urine: NEGATIVE
Glucose, UA: 50 mg/dL — AB
Hgb urine dipstick: NEGATIVE
Ketones, ur: NEGATIVE mg/dL
Leukocytes,Ua: NEGATIVE
Nitrite: NEGATIVE
Protein, ur: NEGATIVE mg/dL
Specific Gravity, Urine: 1.016 (ref 1.005–1.030)
Squamous Epithelial / HPF: NONE SEEN (ref 0–5)
pH: 5 (ref 5.0–8.0)

## 2019-08-25 LAB — CBC
HCT: 48.4 % (ref 39.0–52.0)
Hemoglobin: 15.5 g/dL (ref 13.0–17.0)
MCH: 29.4 pg (ref 26.0–34.0)
MCHC: 32 g/dL (ref 30.0–36.0)
MCV: 91.8 fL (ref 80.0–100.0)
Platelets: 185 10*3/uL (ref 150–400)
RBC: 5.27 MIL/uL (ref 4.22–5.81)
RDW: 12.8 % (ref 11.5–15.5)
WBC: 10.7 10*3/uL — ABNORMAL HIGH (ref 4.0–10.5)
nRBC: 0 % (ref 0.0–0.2)

## 2019-08-25 LAB — URINE DRUG SCREEN, QUALITATIVE (ARMC ONLY)
Amphetamines, Ur Screen: NOT DETECTED
Barbiturates, Ur Screen: NOT DETECTED
Benzodiazepine, Ur Scrn: NOT DETECTED
Cannabinoid 50 Ng, Ur ~~LOC~~: NOT DETECTED
Cocaine Metabolite,Ur ~~LOC~~: POSITIVE — AB
MDMA (Ecstasy)Ur Screen: NOT DETECTED
Methadone Scn, Ur: NOT DETECTED
Opiate, Ur Screen: NOT DETECTED
Phencyclidine (PCP) Ur S: NOT DETECTED
Tricyclic, Ur Screen: POSITIVE — AB

## 2019-08-25 LAB — TROPONIN I (HIGH SENSITIVITY): Troponin I (High Sensitivity): 4 ng/L (ref <18)

## 2019-08-25 LAB — SARS CORONAVIRUS 2 BY RT PCR (HOSPITAL ORDER, PERFORMED IN ~~LOC~~ HOSPITAL LAB): SARS Coronavirus 2: NEGATIVE

## 2019-08-25 LAB — GLUCOSE, CAPILLARY: Glucose-Capillary: 104 mg/dL — ABNORMAL HIGH (ref 70–99)

## 2019-08-25 LAB — APTT: aPTT: 33 seconds (ref 24–36)

## 2019-08-25 LAB — BRAIN NATRIURETIC PEPTIDE: B Natriuretic Peptide: 10 pg/mL (ref 0.0–100.0)

## 2019-08-25 MED ORDER — ENOXAPARIN SODIUM 40 MG/0.4ML ~~LOC~~ SOLN
40.0000 mg | SUBCUTANEOUS | Status: DC
  Administered 2019-08-26: 11:00:00 40 mg via SUBCUTANEOUS
  Filled 2019-08-25: qty 0.4

## 2019-08-25 MED ORDER — NICOTINE 21 MG/24HR TD PT24
21.0000 mg | MEDICATED_PATCH | Freq: Every day | TRANSDERMAL | Status: DC
  Administered 2019-08-25: 21 mg via TRANSDERMAL
  Filled 2019-08-25: qty 1

## 2019-08-25 MED ORDER — ACETAMINOPHEN 650 MG RE SUPP: 650.0000 mg | RECTAL | Status: DC | PRN

## 2019-08-25 MED ORDER — SOFOSBUVIR-VELPATASVIR 400-100 MG PO TABS: 1.0000 | ORAL_TABLET | Freq: Every day | ORAL | Status: DC

## 2019-08-25 MED ORDER — SODIUM CHLORIDE 0.9% FLUSH: 3.0000 mL | Freq: Once | INTRAVENOUS | Status: DC

## 2019-08-25 MED ORDER — LORAZEPAM 1 MG PO TABS
1.0000 mg | ORAL_TABLET | Freq: Once | ORAL | Status: AC
  Administered 2019-08-25: 19:00:00 1 mg via ORAL
  Filled 2019-08-25: qty 1

## 2019-08-25 MED ORDER — STROKE: EARLY STAGES OF RECOVERY BOOK
Freq: Once | Status: AC
  Administered 2019-08-25: 20:00:00

## 2019-08-25 MED ORDER — HYDRALAZINE HCL 20 MG/ML IJ SOLN: 10.0000 mg | Freq: Four times a day (QID) | INTRAMUSCULAR | Status: DC | PRN

## 2019-08-25 MED ORDER — SENNOSIDES-DOCUSATE SODIUM 8.6-50 MG PO TABS: 1.0000 | ORAL_TABLET | Freq: Every evening | ORAL | Status: DC | PRN

## 2019-08-25 MED ORDER — ACETAMINOPHEN 325 MG PO TABS: 650.0000 mg | ORAL_TABLET | ORAL | Status: DC | PRN

## 2019-08-25 MED ORDER — INSULIN ASPART 100 UNIT/ML ~~LOC~~ SOLN
0.0000 [IU] | Freq: Three times a day (TID) | SUBCUTANEOUS | Status: DC
  Administered 2019-08-26: 1 [IU] via SUBCUTANEOUS
  Filled 2019-08-25 (×2): qty 1

## 2019-08-25 MED ORDER — QUETIAPINE FUMARATE ER 300 MG PO TB24
300.0000 mg | ORAL_TABLET | Freq: Every day | ORAL | Status: DC
  Administered 2019-08-25: 300 mg via ORAL
  Filled 2019-08-25 (×2): qty 1

## 2019-08-25 MED ORDER — ACETAMINOPHEN 160 MG/5ML PO SOLN
650.0000 mg | ORAL | Status: DC | PRN
  Filled 2019-08-25: qty 20.3

## 2019-08-25 MED ORDER — ZOLPIDEM TARTRATE 5 MG PO TABS
10.0000 mg | ORAL_TABLET | Freq: Every day | ORAL | Status: DC
  Administered 2019-08-25: 10 mg via ORAL
  Filled 2019-08-25: qty 2

## 2019-08-25 MED ORDER — ASPIRIN EC 325 MG PO TBEC
325.0000 mg | DELAYED_RELEASE_TABLET | Freq: Every day | ORAL | Status: DC
  Administered 2019-08-26: 325 mg via ORAL
  Filled 2019-08-25: qty 1

## 2019-08-25 MED ORDER — INSULIN ASPART 100 UNIT/ML ~~LOC~~ SOLN: 0.0000 [IU] | Freq: Every day | SUBCUTANEOUS | Status: DC

## 2019-08-25 MED ORDER — ATORVASTATIN CALCIUM 20 MG PO TABS
40.0000 mg | ORAL_TABLET | Freq: Every day | ORAL | Status: DC
  Administered 2019-08-25: 23:00:00 40 mg via ORAL
  Filled 2019-08-25: qty 2

## 2019-08-25 MED ORDER — SODIUM CHLORIDE 0.9 % IV SOLN
INTRAVENOUS | Status: DC
  Administered 2019-08-25: via INTRAVENOUS

## 2019-08-25 MED ORDER — SODIUM CHLORIDE 0.9 % IV BOLUS
500.0000 mL | Freq: Once | INTRAVENOUS | Status: AC
  Administered 2019-08-25: 500 mL via INTRAVENOUS

## 2019-08-25 MED ORDER — LORAZEPAM 2 MG/ML IJ SOLN
1.0000 mg | Freq: Once | INTRAMUSCULAR | Status: DC
  Filled 2019-08-25: qty 1

## 2019-08-25 MED ORDER — BUSPIRONE HCL 10 MG PO TABS
10.0000 mg | ORAL_TABLET | Freq: Three times a day (TID) | ORAL | Status: DC
  Administered 2019-08-25 – 2019-08-26 (×2): 10 mg via ORAL
  Filled 2019-08-25 (×4): qty 1

## 2019-08-25 NOTE — ED Notes (Signed)
FIRST NURSE note: pt caregiver states pt c/o LFT sided numbness xcouple of weeks after starting new BP med. Speech clear.

## 2019-08-25 NOTE — ED Provider Notes (Signed)
[facility] Emergency Department Provider Note  ____________________________________________   First MD Initiated Contact with Patient 08/25/19 1547     (approximate)  I have reviewed the triage vital signs and the nursing notes.  History  Chief Complaint Numbness    HPI Ian [name] is a 58 y.o. male with a history of TIA/CVA (with prior left sided weakness, which previously resolved), CAD, hepatitis C, substance use disorder who presents the emergency department with multiple complaints.   First, over the last 3 days or so he has noticed left-sided numbness and tingling, including both his arm and his leg.  He also feels his left side is weaker compared to the right, particularly his left upper extremity.  He denies any headache or vision changes.  No speech difficulties.  Second, he reports over the last several weeks he has been experiencing significant generalized weakness and fatigue.  He is not able to do his normal activities without becoming significantly tired.  He denies any fevers, sick contacts, or shortness of breath.  He does have some chest discomfort with exertion.         Past Medical Hx Past Medical History:  Diagnosis Date  . Anxiety   . Barrett esophagus   . Cancer ([place])   . Coronary artery disease   . Depression   . GERD (gastroesophageal reflux disease)   . Hypertension   . Pre-diabetes   . Stroke [facility]) 2014   "mini-stroke" per patient    Problem List Patient Active Problem List   Diagnosis Date Noted  . Major depression, recurrent, chronic ([place]) 12/13/2018  . Hepatitis C 12/13/2018  . Leukocytosis 05/04/2018  . Severe recurrent major depression without psychotic features ([place]) 11/05/2017  . Dyslipidemia 01/07/2017  . Barrett's esophagus 01/07/2017  . Bipolar I disorder, most recent episode depressed with anxious distress ([place]) 01/06/2017  . Cannabis use disorder, moderate, dependence ([place]) 01/06/2017  .  Tobacco use disorder 01/06/2017  . Alcohol use disorder, moderate, dependence ([place]) 10/06/2016  . Substance induced mood disorder ([place]) 10/06/2016  . Cocaine use disorder, moderate, dependence ([place]) 10/06/2016  . Depression 10/06/2016  . Acute left-sided weakness 05/03/2015  . CVA (cerebral infarction) 05/03/2015  . Weakness 11/16/2013  . Chest pain 11/16/2013  . HTN (hypertension) 11/16/2013  . Left-sided weakness 11/16/2013  . Torsades de pointes ([place]) 11/16/2013  . Hemiplegia, unspecified, affecting nondominant side 11/15/2013  . Speech and language deficits 11/15/2013    Past Surgical Hx Past Surgical History:  Procedure Laterality Date  . ANGIOPLASTY    . APPENDECTOMY    . CARDIAC CATHETERIZATION    . CHOLECYSTECTOMY    . WRIST SURGERY Right    age 40    Medications Prior to Admission medications   Medication Sig Start Date End Date Taking? Authorizing Provider  albuterol (PROVENTIL HFA;VENTOLIN HFA) 108 (90 Base) MCG/ACT inhaler Inhale 2 puffs into the lungs every 6 (six) hours as needed for wheezing or shortness of breath. 12/22/18   [name], [name], MD  aspirin 81 MG chewable tablet Chew 1 tablet (81 mg total) by mouth daily. 12/23/18   [name], [name], MD  atorvastatin (LIPITOR) 40 MG tablet Take 1 tablet (40 mg total) by mouth daily at 6 PM. 12/22/18 02/20/19  [name], [name], MD  busPIRone (BUSPAR) 10 MG tablet Take 1 tablet (10 mg total) by mouth 3 (three) times daily. 12/22/18   [name], [name], MD  cloNIDine (CATAPRES) 0.1 MG tablet Take 1 tablet (0.1 mg  total) by mouth 2 (two) times daily as needed (BP Systolic > 99991111 or Disastolic > 123XX123). 12/22/18   [name], [name], MD  escitalopram (LEXAPRO) 10 MG tablet Take 1 tablet (10 mg total) by mouth daily. 12/23/18   [name], [name], MD  famotidine (PEPCID) 40 MG tablet Take 1 tablet (40 mg total) by mouth at bedtime. 12/22/18   [name], [name], MD  ibuprofen (ADVIL,MOTRIN) 600 MG tablet Take 1 tablet (600 mg total) by mouth every 6 (six)  hours as needed for moderate pain. 12/22/18   [name], [name], MD  lisinopril (PRINIVIL,ZESTRIL) 20 MG tablet Take 1 tablet (20 mg total) by mouth daily. 12/22/18   [name], [name], MD  Melatonin 5 MG TABS Take 1 tablet (5 mg total) by mouth at bedtime. 12/22/18   [name], [name], MD  mometasone-formoterol (DULERA) 200-5 MCG/ACT AERO Inhale 2 puffs into the lungs 2 (two) times daily. 12/22/18   [name], [name], MD  QUEtiapine (SEROQUEL XR) 300 MG 24 hr tablet Take 1 tablet (300 mg total) by mouth at bedtime. 12/22/18   [name], [name], MD  Sofosbuvir-Velpatasvir (EPCLUSA) 400-100 MG TABS Take 1 tablet by mouth daily at 6 PM. 12/23/18   [name], [name], MD  thiamine 100 MG tablet Take 1 tablet (100 mg total) by mouth daily. 12/23/18   [name], [name], MD  zolpidem (AMBIEN) 10 MG tablet Take 1 tablet (10 mg total) by mouth at bedtime. 12/22/18   [name], [name], MD    Allergies Fluoxetine, Hydrocodone-acetaminophen, Mirtazapine, Gabapentin, and Tramadol  Family Hx Family History  Problem Relation Age of Onset  . Stroke Father   . Leukemia Father   . Breast cancer Sister     Social Hx Social History   Tobacco Use  . Smoking status: Current Every Day Smoker    Packs/day: 1.00    Types: Cigarettes  . Smokeless tobacco: Never Used  Substance Use Topics  . Alcohol use: Yes    Comment: denies being a daily drinker but did have alcohol today  . Drug use: Yes    Types: Cocaine, Marijuana    Comment: used cocaine today; denies smoking marijuana for years     Review of Systems  Constitutional: Negative for fever. Negative for chills. Eyes: Negative for visual changes. ENT: Negative for sore throat. Cardiovascular: + for chest pain. Respiratory: + for shortness of breath. Gastrointestinal: Negative for abdominal pain. Negative for nausea. Negative for vomiting. Genitourinary: Negative for dysuria. Musculoskeletal: Negative for leg swelling. Skin: Negative for rash. Neurological: + weakness and  numbness   Physical Exam  Vital Signs: ED Triage Vitals  Enc Vitals Group     BP 08/25/19 1209 (!) 128/96     Pulse Rate 08/25/19 1209 (!) 114     Resp 08/25/19 1209 16     Temp 08/25/19 1209 97.9 F (36.6 C)     Temp Source 08/25/19 1209 Oral     SpO2 08/25/19 1209 96 %     Weight 08/25/19 1206 259 lb 14.8 oz (117.9 kg)     Height 08/25/19 1606 6' (1.829 m)     Head Circumference --      Peak Flow --      Pain Score 08/25/19 1206 0     Pain Loc --      Pain Edu? --      Excl. in [place]? --     Constitutional: Alert and oriented. Appears older than stated age.  Eyes:  Conjunctivae clear. Sclera anicteric. Head: Normocephalic. Atraumatic. Nose: No congestion. No rhinorrhea. Mouth/Throat: Mucous membranes are moist.  Neck: No stridor.   Cardiovascular: Normal rate, regular rhythm. Extremities well perfused. Respiratory: Normal respiratory effort. Scattered wheezing throughout. Gastrointestinal: Soft and non-tender. No distention.  Musculoskeletal: No lower extremity edema. Neurologic:  Normal speech and language.  Reports decreased sensation to the LUE and LLE compared to the right. RUE/RLE strength 5/5. LUE strength 4/5, LLE strength 4+/5. Face symmetric. Tongue midline.  Skin: Skin is warm, dry and intact. No rash noted. Psychiatric: Mood and affect are appropriate for situation.  EKG  Personally reviewed.   Rate: 95 Rhythm: sinus Axis: normal Intervals: WNL No acute ischemic changes No STEMI    Radiology  CT head: IMPRESSION: 1. No acute intracranial abnormality. 2. Mild brain parenchymal atrophy.  XR chest: IMPRESSION: No active disease.     Procedures  Procedure(s) performed (including critical care):  Procedures   Initial Impression / Assessment and Plan / ED Course  58 y.o. male who presents to the ED for left-sided numbness/tingling and left upper extremity weakness, x 3 days.  On exam he reports decreased sensation to the LUE and LLE  compared to the right. RUE/RLE strength 5/5. LUE strength 4/5, LLE strength 4+/5.  Differential includes TIA/CVA, underlying electrolyte or infectious etiology resulting in recrudescence of prior symptoms, amongst others. Patient is not a tPA candidate due to length of symptoms.  Plan for labs, urine, imaging.  CT head negative.  Remainder of work-up reveals mild AKI, creatinine 1.34.  High-sensitivity troponin negative, EKG without acute ischemic changes.  We will plan for admission for further TIA/CVA work-up and evaluation.  Ordered MRI brain and C-spine.  Discussed with hospitalist for admission.   Final Clinical Impression(s) / ED Diagnosis  Final diagnoses:  Numbness  Weakness     Note:  This document was prepared using Dragon voice recognition software and may include unintentional dictation errors.   Lilia Pro., MD 08/25/19 [phone]

## 2019-08-25 NOTE — ED Triage Notes (Addendum)
C/O left arm and leg numbness and tingling x 3 weeks.  Also c/O knot of side of neck.  Friend states symptoms started about 3 weeks ago after starting new blood pressure medication.   AAOx3.  Skin warm and dry. NAD.  MAE equally and strong.

## 2019-08-25 NOTE — H&P (Signed)
[facility] [place] NAME: Ian [name]    MR#:  [id number]  DATE OF BIRTH:  [birth year]  DATE OF ADMISSION:  08/25/2019  PRIMARY CARE PHYSICIAN: Zeb Comfort, MD   REQUESTING/REFERRING PHYSICIAN: Derrell Lolling  CHIEF COMPLAINT:   Chief Complaint  Patient presents with  . Numbness  Left-sided numbness and weakness for 3 days  HISTORY OF PRESENT ILLNESS:  Ian [name]  is a 58 y.o. male with a known history of hypertension, diabetes mellitus and hyperlipidemia and prior history of CVA with left-sided weakness which previously resolved.presented to the emergency room with 3-day history of left-sided numbness and some weakness.  More in the left upper extremity.  Patient also complained of nonspecific generalized weakness.  Patient was evaluated in the emergency room and CT scan of the head without contrast done was negative.  Patient also complained of some nonspecific left-sided neck pain and swelling.  MRI of the brain without contrast as well as MRI of the cervical spine already ordered.  Due to persistence of the symptoms over the last 3 days, patient was not a candidate for TPA.  Diagnosed with probable CVA.  Medical service called to admit patient for further evaluation and management.  PAST MEDICAL HISTORY:   Past Medical History:  Diagnosis Date  . Anxiety   . Barrett esophagus   . Cancer ([place])   . Coronary artery disease   . Depression   . GERD (gastroesophageal reflux disease)   . Hypertension   . Pre-diabetes   . Stroke [facility]) 2014   "mini-stroke" per patient    PAST SURGICAL HISTORY:   Past Surgical History:  Procedure Laterality Date  . ANGIOPLASTY    . APPENDECTOMY    . CARDIAC CATHETERIZATION    . CHOLECYSTECTOMY    . WRIST SURGERY Right    age 58    SOCIAL HISTORY:   Social History   Tobacco Use  . Smoking status: Current Every Day Smoker    Packs/day: 1.00    Types: Cigarettes  . Smokeless tobacco:  Never Used  Substance Use Topics  . Alcohol use: Yes    Comment: denies being a daily drinker but did have alcohol today   Patient smokes 1 pack of cigarettes per day.  Denies any history of alcohol or IV drug use.  FAMILY HISTORY:   Family History  Problem Relation Age of Onset  . Stroke Father   . Leukemia Father   . Breast cancer Sister    Family history significant for diabetes mellitus but no coronary artery disease.  DRUG ALLERGIES:   Allergies  Allergen Reactions  . Fluoxetine Swelling    Anxiety, itching  . Hydrocodone-Acetaminophen Itching  . Mirtazapine Other (See Comments)    nightmares  . Gabapentin Other (See Comments)    Bad dreams and felt bad  . Tramadol Other (See Comments)    REVIEW OF SYSTEMS:   Review of Systems  Constitutional: Negative for chills and fever.  HENT: Negative for hearing loss and tinnitus.   Eyes: Negative for blurred vision and double vision.  Respiratory: Negative for cough and hemoptysis.   Cardiovascular: Negative for chest pain and palpitations.  Gastrointestinal: Negative for abdominal pain, heartburn, nausea and vomiting.  Genitourinary: Negative for dysuria.  Musculoskeletal: Negative for myalgias and neck pain.       Left-sided numbness and some weakness more in the left upper extremity.  Skin: Negative for rash.  Neurological: Negative for dizziness  and headaches.  Psychiatric/Behavioral: Negative for depression and hallucinations.    MEDICATIONS AT HOME:   Prior to Admission medications   Medication Sig Start Date End Date Taking? Authorizing Provider  albuterol (PROVENTIL HFA;VENTOLIN HFA) 108 (90 Base) MCG/ACT inhaler Inhale 2 puffs into the lungs every 6 (six) hours as needed for wheezing or shortness of breath. 12/22/18   [name], [name], MD  aspirin 81 MG chewable tablet Chew 1 tablet (81 mg total) by mouth daily. 12/23/18   [name], [name], MD  atorvastatin (LIPITOR) 40 MG tablet Take 1 tablet (40 mg total) by  mouth daily at 6 PM. 12/22/18 02/20/19  [name], [name], MD  busPIRone (BUSPAR) 10 MG tablet Take 1 tablet (10 mg total) by mouth 3 (three) times daily. 12/22/18   [name], [name], MD  cloNIDine (CATAPRES) 0.1 MG tablet Take 1 tablet (0.1 mg total) by mouth 2 (two) times daily as needed (BP Systolic > 99991111 or Disastolic > 123XX123). 12/22/18   [name], [name], MD  escitalopram (LEXAPRO) 10 MG tablet Take 1 tablet (10 mg total) by mouth daily. 12/23/18   [name], [name], MD  famotidine (PEPCID) 40 MG tablet Take 1 tablet (40 mg total) by mouth at bedtime. 12/22/18   [name], [name], MD  ibuprofen (ADVIL,MOTRIN) 600 MG tablet Take 1 tablet (600 mg total) by mouth every 6 (six) hours as needed for moderate pain. 12/22/18   [name], [name], MD  lisinopril (PRINIVIL,ZESTRIL) 20 MG tablet Take 1 tablet (20 mg total) by mouth daily. 12/22/18   [name], [name], MD  Melatonin 5 MG TABS Take 1 tablet (5 mg total) by mouth at bedtime. 12/22/18   [name], [name], MD  mometasone-formoterol (DULERA) 200-5 MCG/ACT AERO Inhale 2 puffs into the lungs 2 (two) times daily. 12/22/18   [name], [name], MD  QUEtiapine (SEROQUEL XR) 300 MG 24 hr tablet Take 1 tablet (300 mg total) by mouth at bedtime. 12/22/18   [name], [name], MD  Sofosbuvir-Velpatasvir (EPCLUSA) 400-100 MG TABS Take 1 tablet by mouth daily at 6 PM. 12/23/18   [name], [name], MD  thiamine 100 MG tablet Take 1 tablet (100 mg total) by mouth daily. 12/23/18   [name], [name], MD  zolpidem (AMBIEN) 10 MG tablet Take 1 tablet (10 mg total) by mouth at bedtime. 12/22/18   [name], [name], MD      VITAL SIGNS:  Blood pressure 116/90, pulse 90, temperature 97.9 F (36.6 C), temperature source Oral, resp. rate 20, height 6' (1.829 m), weight 117.9 kg, SpO2 97 %.  PHYSICAL EXAMINATION:  Physical Exam  GENERAL:  58 y.o.-year-old patient lying in the bed with no acute distress.  EYES: Pupils equal, round, reactive to light and accommodation. No scleral icterus. Extraocular muscles intact.   HEENT: Head atraumatic, normocephalic. Oropharynx and nasopharynx clear.  NECK:  Supple, no jugular venous distention. No thyroid enlargement, no tenderness.  LUNGS: Normal breath sounds bilaterally, no wheezing, rales,rhonchi or crepitation. No use of accessory muscles of respiration.  CARDIOVASCULAR: S1, S2 normal. No murmurs, rubs, or gallops.  ABDOMEN: Soft, nontender, nondistended. Bowel sounds present. No organomegaly or mass.  EXTREMITIES: No pedal edema, cyanosis, or clubbing.  NEUROLOGIC: Strength of 3-4 over 5 on the left upper and lower extremity.  Strength of 4-5 over 5 on the right side.  Sensation intact.  Gait not checked.   PSYCHIATRIC: The patient is alert and oriented x 3.  SKIN: No obvious rash, lesion, or ulcer.  LABORATORY PANEL:   CBC Recent Labs  Lab 08/25/19 1215  WBC 10.7*  HGB 15.5  HCT 48.4  PLT 185   ------------------------------------------------------------------------------------------------------------------  Chemistries  Recent Labs  Lab 08/25/19 1215  NA 138  K 5.0  CL 105  CO2 22  GLUCOSE 240*  BUN 25*  CREATININE 1.34*  CALCIUM 9.6  AST 26  ALT 25  ALKPHOS 58  BILITOT 0.6   ------------------------------------------------------------------------------------------------------------------  Cardiac Enzymes No results for input(s): TROPONINI in the last 168 hours. ------------------------------------------------------------------------------------------------------------------  RADIOLOGY:  Ct Head Wo Contrast  Result Date: 08/25/2019 CLINICAL DATA:  Left-sided tingling for 2 weeks. EXAM: CT HEAD WITHOUT CONTRAST TECHNIQUE: Contiguous axial images were obtained from the base of the skull through the vertex without intravenous contrast. COMPARISON:  July 12, 2017 FINDINGS: Brain: No evidence of acute infarction, hemorrhage, hydrocephalus, extra-axial collection or mass lesion/mass effect. Mild brain parenchymal volume loss. Vascular: No  hyperdense vessel or unexpected calcification. Skull: Normal. Negative for fracture or focal lesion. Sinuses/Orbits: No acute finding. Other: None. IMPRESSION: 1. No acute intracranial abnormality. 2. Mild brain parenchymal atrophy. Electronically Signed   By: Fidela Salisbury M.D.   On: 08/25/2019 12:35   Dg Chest Port 1 View  Result Date: 08/25/2019 CLINICAL DATA:  Chest discomfort EXAM: PORTABLE CHEST 1 VIEW COMPARISON:  11/05/2018 FINDINGS: No focal opacity or pleural effusion. Stable cardiomediastinal silhouette. No pneumothorax. IMPRESSION: No active disease. Electronically Signed   By: Donavan Foil M.D.   On: 08/25/2019 16:30      IMPRESSION AND PLAN:  Patient is a 58 year old male with history of hypertension, diabetes mellitus and hyperlipidemia with ongoing tobacco abuse admitted for evaluation of probable CVA.  1.  Probable acute CVA. Patient with known history of prior CVA with left-sided weakness which previously resolved.  Presented to the emergency room with 3-day history of left-sided numbness and some weakness.  CT head with no acute findings. MRI of the brain already ordered. Due to nonspecific complaint of some swelling and pain on the left side of the neck MRI of the cervical spine also ordered. Placed on aspirin and statins.  Lipid panel in a.m. No inpatient neurologist available tonight.  May consider getting neurology consultation in a.m. Allow for permissive hypertension.  Holding off on home blood pressure meds.  Last blood pressure of 116/90.  PRN IV hydralazine for systolic blood pressure greater than 200.  2.  Hypertension Holding off on blood pressure medications tonight due to ongoing evaluation for CVA.  3.  Diabetes mellitus. Placed on sliding scale insulin coverage.  Glycosylated hemoglobin level in a.m.  Monitor blood sugars and adjust regimen  4.  Hyperlipidemia Continue statins.  Lipid panel in a.m.  5.  Acute kidney injury Likely prerenal.    Placed on IV fluids.  Follow-up on renal function in a.m.  6.  Tobacco abuse Smoking cessation counseling done for 4 minutes.  Patient motivated to quit. Placed on nicotine patch.  7.  Morbid obesity with BMI of 35.2. Encourage lifestyle modification including weight loss.  DVT prophylaxis; Lovenox  All the records are reviewed and case discussed with ED provider. Management plans discussed with the patient, and he is seen in agreement.  CODE STATUS: Full code  TOTAL TIME TAKING CARE OF THIS PATIENT: 58 minutes.    Bianka Liberati M.D on 08/25/2019 at 6:20 PM  Between 7am to 6pm - Pager - [phone]  After 6pm go to [email or web address] - [employer] [facility]  Office  [phone]  CC: Primary care physician; Zeb Comfort, MD   Note: This dictation was prepared with Dragon dictation along with smaller phrase technology. Any transcriptional errors that result from this process are unintentional.

## 2019-08-25 NOTE — ED Notes (Signed)
ED TO INPATIENT HANDOFF REPORT  ED Nurse Name and Phone #: Gwenette Greet  [phone] Name/Age/Gender Ian [name] 58 y.o. male Room/Bed: ED16A/ED16A  Code Status   Code Status: Full Code  Home/SNF/Other Home Patient oriented to: self, place, time and situation Is this baseline? Yes   Triage Complete: Triage complete  Chief Complaint multi  Triage Note C/O left arm and leg numbness and tingling x 3 weeks.  Also c/O knot of side of neck.  Friend states symptoms started about 3 weeks ago after starting new blood pressure medication.   AAOx3.  Skin warm and dry. NAD.  MAE equally and strong.   Allergies Allergies  Allergen Reactions  . Fluoxetine Swelling    Anxiety, itching  . Hydrocodone-Acetaminophen Itching  . Mirtazapine Other (See Comments)    nightmares  . Gabapentin Other (See Comments)    Bad dreams and felt bad  . Tramadol Other (See Comments)    Level of Care/Admitting Diagnosis ED Disposition    ED Disposition Condition [facility]: [place] [[id number]]  Level of Care: Med-Surg [16]  Covid Evaluation: Asymptomatic Screening Protocol (No Symptoms)  Diagnosis: [id number]  Admitting Physician: Otila Back [place]  Attending Physician: Otila Back [[id number]]  Estimated length of stay: past midnight tomorrow  Certification:: I certify this patient will need inpatient services for at least 2 midnights  PT Class (Do Not Modify): Inpatient [101]  PT Acc Code (Do Not Modify): Private [1]       B Medical/Surgery History Past Medical History:  Diagnosis Date  . Anxiety   . Barrett esophagus   . Cancer ([place])   . Coronary artery disease   . Depression   . GERD (gastroesophageal reflux disease)   . Hypertension   . Pre-diabetes   . Stroke [facility]) 2014   "mini-stroke" per patient   Past Surgical History:  Procedure Laterality Date  . ANGIOPLASTY    . APPENDECTOMY    . CARDIAC  CATHETERIZATION    . CHOLECYSTECTOMY    . WRIST SURGERY Right    age 63     A IV Location/Drains/Wounds Patient Lines/Drains/Airways Status   Active Line/Drains/Airways    Name:   Placement date:   Placement time:   Site:   Days:   Peripheral IV 08/25/19 Left Hand   08/25/19    1639    Hand   less than 1          Intake/Output Last 24 hours No intake or output data in the 24 hours ending 08/25/19 1920  Labs/Imaging Results for orders placed or performed during the hospital encounter of 08/25/19 (from the past 48 hour(s))  Protime-INR     Status: None   Collection Time: 08/25/19 12:15 PM  Result Value Ref Range   Prothrombin Time 12.1 11.4 - 15.2 seconds   INR 0.9 0.8 - 1.2    Comment: (NOTE) INR goal varies based on device and disease states. Performed at [facility], [address] [place], [place] [address]   APTT     Status: None   Collection Time: 08/25/19 12:15 PM  Result Value Ref Range   aPTT 33 24 - 36 seconds    Comment: Performed at [facility], [address] [place], [place] [address]  CBC     Status: Abnormal   Collection Time: 08/25/19 12:15 PM  Result Value Ref Range   WBC 10.7 (H) 4.0 -  10.5 K/uL   RBC 5.27 4.22 - 5.81 MIL/uL   Hemoglobin 15.5 13.0 - 17.0 g/dL   HCT 48.4 39.0 - 52.0 %   MCV 91.8 80.0 - 100.0 fL   MCH 29.4 26.0 - 34.0 pg   MCHC 32.0 30.0 - 36.0 g/dL   RDW 12.8 11.5 - 15.5 %   Platelets 185 150 - 400 K/uL   nRBC 0.0 0.0 - 0.2 %    Comment: Performed at [facility], [address] [place], [place] [address]  Differential     Status: None   Collection Time: 08/25/19 12:15 PM  Result Value Ref Range   Neutrophils Relative % 61 %   Neutro Abs 6.6 1.7 - 7.7 K/uL   Lymphocytes Relative 30 %   Lymphs Abs 3.2 0.7 - 4.0 K/uL   Monocytes Relative 5 %   Monocytes Absolute 0.5 0.1 - 1.0 K/uL   Eosinophils Relative 3 %   Eosinophils Absolute 0.3 0.0 - 0.5 K/uL   Basophils Relative 0 %   Basophils  Absolute 0.0 0.0 - 0.1 K/uL   Immature Granulocytes 1 %   Abs Immature Granulocytes 0.05 0.00 - 0.07 K/uL    Comment: Performed at [facility], [address] [place], [place] [address]  Comprehensive metabolic panel     Status: Abnormal   Collection Time: 08/25/19 12:15 PM  Result Value Ref Range   Sodium 138 135 - 145 mmol/L   Potassium 5.0 3.5 - 5.1 mmol/L   Chloride 105 98 - 111 mmol/L   CO2 22 22 - 32 mmol/L   Glucose, Bld 240 (H) 70 - 99 mg/dL   BUN 25 (H) 6 - 20 mg/dL   Creatinine, Ser 1.34 (H) 0.61 - 1.24 mg/dL   Calcium 9.6 8.9 - 10.3 mg/dL   Total Protein 7.3 6.5 - 8.1 g/dL   Albumin 3.9 3.5 - 5.0 g/dL   AST 26 15 - 41 U/L   ALT 25 0 - 44 U/L   Alkaline Phosphatase 58 38 - 126 U/L   Total Bilirubin 0.6 0.3 - 1.2 mg/dL   GFR calc non Af Amer 58 (L) >60 mL/min   GFR calc Af Amer >60 >60 mL/min   Anion gap 11 5 - 15    Comment: Performed at [facility], [address], [place], [place] [address]  Brain natriuretic peptide     Status: None   Collection Time: 08/25/19 12:15 PM  Result Value Ref Range   B Natriuretic Peptide 10.0 0.0 - 100.0 pg/mL    Comment: Performed at [facility], [place], [place] [address]  Troponin I (High Sensitivity)     Status: None   Collection Time: 08/25/19  4:07 PM  Result Value Ref Range   Troponin I (High Sensitivity) 4 <18 ng/L    Comment: (NOTE) Elevated high sensitivity troponin I (hsTnI) values and significant  changes across serial measurements may suggest ACS but many other  chronic and acute conditions are known to elevate hsTnI results.  Refer to the "Links" section for chest pain algorithms and additional  guidance. Performed at [facility], [address] [place], [place] [address]    Ct Head Wo Contrast  Result Date: 08/25/2019 CLINICAL DATA:  Left-sided tingling for 2 weeks. EXAM: CT HEAD WITHOUT CONTRAST TECHNIQUE: Contiguous axial images were obtained from the  base of the skull through the vertex without intravenous contrast. COMPARISON:  July 12, 2017 FINDINGS: Brain: No evidence  of acute infarction, hemorrhage, hydrocephalus, extra-axial collection or mass lesion/mass effect. Mild brain parenchymal volume loss. Vascular: No hyperdense vessel or unexpected calcification. Skull: Normal. Negative for fracture or focal lesion. Sinuses/Orbits: No acute finding. Other: None. IMPRESSION: 1. No acute intracranial abnormality. 2. Mild brain parenchymal atrophy. Electronically Signed   By: Fidela Salisbury M.D.   On: 08/25/2019 12:35   Dg Chest Port 1 View  Result Date: 08/25/2019 CLINICAL DATA:  Chest discomfort EXAM: PORTABLE CHEST 1 VIEW COMPARISON:  11/05/2018 FINDINGS: No focal opacity or pleural effusion. Stable cardiomediastinal silhouette. No pneumothorax. IMPRESSION: No active disease. Electronically Signed   By: Donavan Foil M.D.   On: 08/25/2019 16:30    Pending Labs Unresulted Labs (From admission, onward)    Start     Ordered   08/26/19 0500  Lipid panel  Tomorrow morning,   STAT    Comments: Fasting    08/25/19 1813   08/26/19 0500  Hemoglobin A1c  Tomorrow morning,   STAT    Comments: To assess prior glycemic control    08/25/19 1819   08/25/19 1810  HIV antibody (Routine Testing)  Once,   STAT     08/25/19 1813   08/25/19 1704  SARS Coronavirus 2 [facility] order, Performed in [facility] hospital lab) Nasopharyngeal Nasopharyngeal Swab  (Symptomatic/High Risk of Exposure/Tier 1 Patients Labs with Precautions)  ONCE - STAT,   STAT    Question Answer Comment  Is this test for diagnosis or screening Diagnosis of ill patient   Symptomatic for COVID-19 as defined by CDC Yes   Date of Symptom Onset 08/25/2019   Hospitalized for COVID-19 No   Admitted to ICU for COVID-19 No   Previously tested for COVID-19 No   Resident in a congregate (group) care setting No   Employed in healthcare setting No      08/25/19 1703   08/25/19 1608   Urinalysis, Complete w Microscopic  ONCE - STAT,   STAT     08/25/19 1607   08/25/19 1607  Urine Drug Screen, Qualitative  Once,   STAT     08/25/19 1607          Vitals/Pain Today's Vitals   08/25/19 1606 08/25/19 1644 08/25/19 1730 08/25/19 1900  BP:   116/90 120/89  Pulse:  93 90 83  Resp:  (!) 21 20 16   Temp:      TempSrc:      SpO2:  95% 97% 98%  Weight:      Height: 6' (1.829 m)     PainSc:        Isolation Precautions No active isolations  Medications Medications  sodium chloride flush (NS) 0.9 % injection 3 mL (has no administration in time range)  Sofosbuvir-Velpatasvir 400-100 MG TABS 1 tablet (has no administration in time range)  atorvastatin (LIPITOR) tablet 40 mg (has no administration in time range)  busPIRone (BUSPAR) tablet 10 mg (has no administration in time range)  QUEtiapine (SEROQUEL XR) 24 hr tablet 300 mg (has no administration in time range)   stroke: mapping our early stages of recovery book (has no administration in time range)  0.9 %  sodium chloride infusion (has no administration in time range)  acetaminophen (TYLENOL) tablet 650 mg (has no administration in time range)    Or  acetaminophen (TYLENOL) solution 650 mg (has no administration in time range)    Or  acetaminophen (TYLENOL) suppository 650 mg (has no administration in time range)  senna-docusate (Senokot-S) tablet 1  tablet (has no administration in time range)  enoxaparin (LOVENOX) injection 40 mg (has no administration in time range)  hydrALAZINE (APRESOLINE) injection 10 mg (has no administration in time range)  insulin aspart (novoLOG) injection 0-9 Units (has no administration in time range)  insulin aspart (novoLOG) injection 0-5 Units (has no administration in time range)  aspirin EC tablet 325 mg (has no administration in time range)  nicotine (NICODERM CQ - dosed in mg/24 hours) patch 21 mg (has no administration in time range)  sodium chloride 0.9 % bolus 500 mL (500 mLs  Intravenous New Bag/Given 08/25/19 1803)  LORazepam (ATIVAN) tablet 1 mg (1 mg Oral Given 08/25/19 1916)    Mobility walks High fall risk   Focused Assessments Neuro Assessment Handoff:  Swallow screen pass? Yes    NIH Stroke Scale ( + Modified Stroke Scale Criteria)  Interval: Initial Level of Consciousness (1a.)   : Alert, keenly responsive LOC Questions (1b. )   +: Answers both questions correctly LOC Commands (1c. )   + : Performs both tasks correctly Best Gaze (2. )  +: Normal Visual (3. )  +: Partial hemianopia(left side not as clear as right side) Facial Palsy (4. )    : Normal symmetrical movements Motor Arm, Left (5a. )   +: Drift Motor Arm, Right (5b. )   +: No drift Motor Leg, Left (6a. )   +: Some effort against gravity Motor Leg, Right (6b. )   +: No drift Limb Ataxia (7. ): Present in two limbs(left arm difficulty with finger to nose and left leg difficulty  heel to  shin) Sensory (8. )   +: Mild-to-moderate sensory loss, patient feels pinprick is less sharp or is dull on the affected side, or there is a loss of superficial pain with pinprick, but patient is aware of being touched Best Language (9. )   +: No aphasia Dysarthria (10. ): Normal Extinction/Inattention (11.)   +: No Abnormality Modified SS Total  +: 5 Complete NIHSS TOTAL: 7     Neuro Assessment:   Neuro Checks:   Initial (08/25/19 1836)  Last Documented NIHSS Modified Score: 5 (08/25/19 1836) Has TPA been given? No If patient is a Neuro Trauma and patient is going to OR before floor call report to [place] nurse: [phone] or [phone]     R Recommendations: See Admitting Provider Note  Report given to:   Additional Notes: Pt to MRI at [facility]

## 2019-08-26 ENCOUNTER — Inpatient Hospital Stay
Admit: 2019-08-26 | Discharge: 2019-08-26 | Disposition: A | Discharge status: Home / Self Care | Payer: Medicaid Other | Attending: Internal Medicine | Admitting: Internal Medicine

## 2019-08-26 ENCOUNTER — Observation Stay: Payer: Medicaid Other

## 2019-08-26 DIAGNOSIS — I639 Cerebral infarction, unspecified: Secondary | ICD-10-CM

## 2019-08-26 DIAGNOSIS — R200 Anesthesia of skin: Secondary | ICD-10-CM

## 2019-08-26 LAB — BASIC METABOLIC PANEL
Anion gap: 7 (ref 5–15)
BUN: 21 mg/dL — ABNORMAL HIGH (ref 6–20)
CO2: 23 mmol/L (ref 22–32)
Calcium: 8.7 mg/dL — ABNORMAL LOW (ref 8.9–10.3)
Chloride: 107 mmol/L (ref 98–111)
Creatinine, Ser: 1.12 mg/dL (ref 0.61–1.24)
GFR calc Af Amer: >60 mL/min (ref >60)
GFR calc non Af Amer: >60 mL/min (ref >60)
Glucose, Bld: 169 mg/dL — ABNORMAL HIGH (ref 70–99)
Potassium: 4.4 mmol/L (ref 3.5–5.1)
Sodium: 137 mmol/L (ref 135–145)

## 2019-08-26 LAB — GLUCOSE, CAPILLARY
Glucose-Capillary: 141 mg/dL — ABNORMAL HIGH (ref 70–99)
Glucose-Capillary: 126 mg/dL — ABNORMAL HIGH (ref 70–99)

## 2019-08-26 LAB — LIPID PANEL
Cholesterol: 178 mg/dL (ref 0–200)
HDL: 23 mg/dL — ABNORMAL LOW (ref >40)
LDL Cholesterol: 80 mg/dL (ref 0–99)
Total CHOL/HDL Ratio: 7.7 RATIO
Triglycerides: 375 mg/dL — ABNORMAL HIGH (ref <150)
VLDL: 75 mg/dL — ABNORMAL HIGH (ref 0–40)

## 2019-08-26 LAB — HEMOGLOBIN A1C
Hgb A1c MFr Bld: 7.4 % — ABNORMAL HIGH (ref 4.8–5.6)
Mean Plasma Glucose: 165.68 mg/dL

## 2019-08-26 MED ORDER — CLONAZEPAM 0.5 MG PO TABS: 0.5000 mg | ORAL_TABLET | Freq: Two times a day (BID) | ORAL | Status: DC | PRN

## 2019-08-26 MED ORDER — ALBUTEROL SULFATE (2.5 MG/3ML) 0.083% IN NEBU
2.5000 mg | INHALATION_SOLUTION | RESPIRATORY_TRACT | Status: DC | PRN
  Administered 2019-08-26: 2.5 mg via RESPIRATORY_TRACT
  Filled 2019-08-26: qty 3

## 2019-08-26 MED ORDER — QUETIAPINE FUMARATE ER 300 MG PO TB24: 300.0000 mg | ORAL_TABLET | Freq: Every day | ORAL | 1 refills | Status: DC

## 2019-08-26 MED ORDER — ENSURE MAX PROTEIN PO LIQD
11.0000 [oz_av] | Freq: Two times a day (BID) | ORAL | Status: DC
  Filled 2019-08-26: qty 330

## 2019-08-26 MED ORDER — PERFLUTREN LIPID MICROSPHERE
1.0000 mL | INTRAVENOUS | Status: AC | PRN
  Administered 2019-08-26: 2 mL via INTRAVENOUS
  Filled 2019-08-26: qty 10

## 2019-08-26 MED ORDER — THIAMINE HCL 100 MG PO TABS: 100.0000 mg | ORAL_TABLET | Freq: Every day | ORAL | 1 refills | Status: DC

## 2019-08-26 MED ORDER — ADULT MULTIVITAMIN W/MINERALS CH: 1.0000 | ORAL_TABLET | Freq: Every day | ORAL | Status: DC

## 2019-08-26 NOTE — Plan of Care (Signed)
  Problem: Education: Goal: Knowledge of secondary prevention will improve Outcome: Progressing Goal: Knowledge of patient specific risk factors addressed and post discharge goals established will improve Outcome: Progressing Goal: Individualized Educational Video(s) Outcome: Progressing   Problem: Coping: Goal: Will identify appropriate support needs Outcome: Progressing   Problem: Health Behavior/Discharge Planning: Goal: Ability to manage health-related needs will improve Outcome: Progressing   Problem: Self-Care: Goal: Ability to participate in self-care as condition permits will improve Outcome: Progressing Goal: Ability to communicate needs accurately will improve Outcome: Progressing   Problem: Nutrition: Goal: Risk of aspiration will decrease Outcome: Progressing   Problem: Ischemic Stroke/TIA Tissue Perfusion: Goal: Complications of ischemic stroke/TIA will be minimized Outcome: Progressing

## 2019-08-26 NOTE — Progress Notes (Signed)
PT Cancellation Note  Patient Details Name: Shirly Moresi MRN: [id number] DOB: 24-Mar-1961   Cancelled Treatment:    Reason Eval/Treat Not Completed: Patient at procedure or test/unavailable(Consult received and chart reviewed.  Patient currently undergoing ECHO and unavailable for participation with session.  Will re-attempt at later time/date as medically appropriate.)   Shahira Fiske H. Owens Shark, PT, DPT, NCS 08/26/19, 10:12 AM [phone]

## 2019-08-26 NOTE — TOC Progression Note (Signed)
Transition of Care [facility]) - Progression Note    Patient Details  Name: Ian [name] MRN: [id number] Date of Birth: February 26, 1961  Transition of [facility]) CM/SW Contact  Su Hilt, RN Phone Number: 08/26/2019, 3:42 PM  Clinical Narrative:     Damaris Schooner with Helene Kelp at [place] they are unable to take the patient Talked to [facility] with [facility], they are unable to accept the patient for [facility] Spoke to [facility] with Alvis Lemmings, he is checking with his office to see if they can accept the patient and will call me back       Expected Discharge Plan and Services           Expected Discharge Date: 08/27/19                                     Social Determinants of Health (SDOH) Interventions    Readmission Risk Interventions No flowsheet data found.

## 2019-08-26 NOTE — Progress Notes (Signed)
Initial Nutrition Assessment  DOCUMENTATION CODES:   Morbid obesity  INTERVENTION:   Ensure Max protein supplement BID, each supplement provides 150kcal and 30g of protein.  MVI daily   NUTRITION DIAGNOSIS:   Increased nutrient needs related to other (see comment)(morbid obesity) as evidenced by increased estimated needs  GOAL:   Patient will meet greater than or equal to 90% of their needs  MONITOR:   PO intake, Supplement acceptance, Labs, Weight trends, Skin, I & O's  REASON FOR ASSESSMENT:   Malnutrition Screening Tool    ASSESSMENT:   58 y.o. male with a known history of hypertension, diabetes mellitus, substance abuse, bipolar disorder, barrett's esophagus, GERD and hyperlipidemia and prior history of CVA with left-sided weakness which previously resolved presented to the emergency room with 3-day history of left-sided numbness and some weakness.  RD working remotely.  Pt with good appetite and oral intake today; pt eating 100% of meals. RD will add supplements to help pt meet his estimated protein needs as pt morbid obesity. Per chart, pt with weight gain pta.    Medications reviewed and include: aspirin, lovenox, insulin, nicotine, NaCl @100ml /hr  Labs reviewed: BUN 25(H), creat 1.34(H) cbgs- 104, 141 x 24 hrs AIC 7.4(H)- 9/4  Unable to complete Nutrition-Focused physical exam at this time.   Diet Order:   Diet Order            Diet heart healthy/carb modified Room service appropriate? Yes; Fluid consistency: Thin  Diet effective now             EDUCATION NEEDS:   No education needs have been identified at this time  Skin:  Skin Assessment: Reviewed RN Assessment  Last BM:  PTA  Height:   Ht Readings from Last 1 Encounters:  08/25/19 6' (1.829 m)    Weight:   Wt Readings from Last 1 Encounters:  08/25/19 134.5 kg    Ideal Body Weight:  80.9 kg  BMI:  Body mass index is 40.22 kg/m.  Estimated Nutritional Needs:   Kcal:   2400-2700kcal/day  Protein:  >135g/day  Fluid:  >2.4L/day  Koleen Distance MS, RD, LDN Pager #- [phone] Office#- [phone] After Hours Pager: [phone]

## 2019-08-26 NOTE — Evaluation (Signed)
Speech Language Pathology Evaluation Patient Details Name: Ian [name] MRN: [id number] DOB: 1961-08-18 Today's Date: 08/26/2019 Time: 1300-1330 SLP Time Calculation (min) (ACUTE ONLY): 30 min  Problem List:  Patient Active Problem List   Diagnosis Date Noted  . CVA (cerebral vascular accident) ([place]) 08/25/2019  . Major depression, recurrent, chronic ([place]) 12/13/2018  . Hepatitis C 12/13/2018  . Leukocytosis 05/04/2018  . Severe recurrent major depression without psychotic features ([place]) 11/05/2017  . Dyslipidemia 01/07/2017  . Barrett's esophagus 01/07/2017  . Bipolar I disorder, most recent episode depressed with anxious distress ([place]) 01/06/2017  . Cannabis use disorder, moderate, dependence ([place]) 01/06/2017  . Tobacco use disorder 01/06/2017  . Alcohol use disorder, moderate, dependence ([place]) 10/06/2016  . Substance induced mood disorder ([place]) 10/06/2016  . Cocaine use disorder, moderate, dependence ([place]) 10/06/2016  . Depression 10/06/2016  . Acute left-sided weakness 05/03/2015  . CVA (cerebral infarction) 05/03/2015  . Weakness 11/16/2013  . Chest pain 11/16/2013  . HTN (hypertension) 11/16/2013  . Left-sided weakness 11/16/2013  . Torsades de pointes ([place]) 11/16/2013  . Hemiplegia, unspecified, affecting nondominant side 11/15/2013  . Speech and language deficits 11/15/2013   Past Medical History:  Past Medical History:  Diagnosis Date  . Anxiety   . Barrett esophagus   . Cancer ([place])   . Coronary artery disease   . Depression   . GERD (gastroesophageal reflux disease)   . Hypertension   . Pre-diabetes   . Stroke [facility]) 2014   "mini-stroke" per patient   Past Surgical History:  Past Surgical History:  Procedure Laterality Date  . ANGIOPLASTY    . APPENDECTOMY    . CARDIAC CATHETERIZATION    . CHOLECYSTECTOMY    . WRIST SURGERY Right    age 33   HPI:  Ian [name] is an 58 y.o. male male with a known history of hypertension, diabetes mellitus,  hyperlipidemia, prior stroke with left-sided weakness which previously resolved.presented to the emergency room with 3-day history of left-sided numbness and some weakness.  Mostly in the left upper extremity.  Patient also complained of nonspecific generalized weakness.  No acute findings on MRI brain and MRI C spine with chronic bulging but no central canal stenosis.     Assessment / Plan / Recommendation Clinical Impression  The patient was given the [facility], earning a score of 21/30 (Normal ? 26/30).  Cognitively, the patient's primary deficits are with immediate and short-term memory.  He is demonstrating functional speech/communication skills.  The patient was able to explain several compensatory strategies that he is using to manage taking pills on time and remembering appointments.  The patient was counseled that if he would like to pursue cognitive training after discharge he should speak with his medical team for referral.  The patient does not require further speech therapy services while he is here.  Will sign off.   [place] Cognitive Assessment [facility]) Version: 8.1 Visuospatial/Executive Alternating trail making       1/1  Visuoconstruction Skills (copy 3-d design) 0/1 Draw a clock     3/3 Naming     3/3 Attention Forward digit span    0/1 Backward digit span    1/1 Vigilance     1/1 Serial 7's     3/3 Language  Verbal Fluency     1/1 Repetition     0/2 Abstraction     1/2 Delayed Recall    1/5  Memory Index Score  10/15 Orientation     6/6 TOTAL  21/30        Normal  ? 26/30       SLP Assessment  SLP Recommendation/Assessment: All further Speech Lanaguage Pathology  needs can be addressed in the next venue of care SLP Visit Diagnosis: Cognitive communication deficit (R41.841)    Follow Up Recommendations       Frequency and Duration           SLP Evaluation Cognition  Overall Cognitive Status: History of cognitive impairments - at baseline Arousal/Alertness:  Awake/alert       Comprehension  Auditory Comprehension Overall Auditory Comprehension: Appears within functional limits for tasks assessed Reading Comprehension Reading Status: Impaired(Dyslexia)    Expression Expression Primary Mode of Expression: Verbal Verbal Expression Overall Verbal Expression: Appears within functional limits for tasks assessed Written Expression Dominant Hand: Right   Oral / Motor  Motor Speech Overall Motor Speech: Appears within functional limits for tasks assessed   GO                   Leroy Sea, MS/CCC- SLP  Valetta Fuller, Susie 08/26/2019, 4:57 PM

## 2019-08-26 NOTE — Evaluation (Signed)
Physical Therapy Evaluation Patient Details Name: Ian [name] MRN: [id number] DOB: [birth year] Today's Date: 08/26/2019   History of Present Illness  Patient is 58 year old male who presented to hospital with weakness, neck pain on L and L sided numbness for 3 days.  He has hx of prior CVA with L weakness, bipolar disorder aand several admissions in past year for similar symptoms.  MRI of head was negative for acute abnormality, and MRI of C spine indicated mild noncompressive disc bulging at C3-4 through C6-7 without stenosis.  Clinical Impression  Upon evaluation, patient alert and oriented; follows commands and participates well with session.  Eager for upcoming discharge.  Reports persistent weakness in L UE/LE (4-/5), but feels improved since initial presentation to facility.  Able to complete sit/stand, basic transfers and gait (100') with RW, cga/min assist. Completes 5x sit/stand with single UE support in 28 seconds, indicative of decreased LE strength/power, increased fall risk.  Demonstrates reciprocal stepping pattern, decreased stance time L LE (mild knee instability in loading, but no buckling); mod WBing on RW for support; fatigues quickly, requiring 3 standing rest periods to complete distance (vitals stable and WFL).  Do recommend continued use of RW for optimal safety with mobility; patient voices understanding of recommendation, but feels "it's not like this when I'm at home". Would benefit from skilled PT to address above deficits and promote optimal return to PLOF; Recommend transition to [facility] upon discharge from acute hospitalization.      Follow Up Recommendations Home health PT    Equipment Recommendations  (has RW in home environment)    Recommendations for Other Services       Precautions / Restrictions Precautions Precautions: Fall Restrictions Weight Bearing Restrictions: No      Mobility  Bed Mobility               General bed mobility comments:  seated edge of bed beginning/end of session  Transfers Overall transfer level: Needs assistance Equipment used: Rolling walker (2 wheeled) Transfers: Sit to/from Stand Sit to Stand: Min guard         General transfer comment: requires UE support to fully complete lift off; broad BOS, fair LE strength/power  Ambulation/Gait Ambulation/Gait assistance: Min guard Gait Distance (Feet): 100 Feet Assistive device: Rolling walker (2 wheeled)       General Gait Details: reciprocal stepping pattern, decreased stance time L LE (mild knee instability in loading, but no buckling); mod WBing on RW for support; fatigues quickly, requiring 3 standing rest periods to complete distance (vitals stable and WFL)  Stairs            Wheelchair Mobility    Modified Rankin (Stroke Patients Only)       Balance Overall balance assessment: Needs assistance Sitting-balance support: No upper extremity supported;Feet supported Sitting balance-Leahy Scale: Good     Standing balance support: Bilateral upper extremity supported Standing balance-Leahy Scale: Fair                               Pertinent Vitals/Pain Pain Assessment: Faces Pain Score: 7  Faces Pain Scale: Hurts a little bit Pain Location: general soreness Pain Descriptors / Indicators: Aching;Constant;Discomfort Pain Intervention(s): Limited activity within patient's tolerance;Monitored during session;Repositioned    Home Living Family/patient expects to be discharged to:: Private residence Living Arrangements: Alone Available Help at Discharge: Friend(s) Type of Home: Mobile home Home Access: Stairs to enter Entrance Stairs-Rails: (has bilateral  railing but ar not stable or functional to use) Entrance Stairs-Number of Steps: 7 Home Layout: One level Home Equipment: Walker - 2 wheels;Cane - single point;Grab bars - tub/shower Additional Comments: sleeps on couch    Prior Function Level of Independence:  Independent         Comments: Indep with ADLs, household and limited community mobilization without assist device; does endorse 4-5 falls ("due to getting up too quickly") in previous six months     Hand Dominance   Dominant Hand: Right    Extremity/Trunk Assessment   Upper Extremity Assessment Upper Extremity Assessment: (grossly at least 4-/5 throughout) LUE Deficits / Details: Numbness in R UE and hand but can feel most sensation except very light touch, L UE and hand weak but has movement to about 90 degrees shoulder flexion with poor control.  Able to make a fist with L hand and hold phone but unablle lo pronate/supinate and maintain hold. LUE Sensation: decreased light touch LUE Coordination: decreased fine motor;decreased gross motor    Lower Extremity Assessment Lower Extremity Assessment: (R LE 4+ to 5/5, L LE 4-/5 throughout)       Communication   Communication: No difficulties  Cognition Arousal/Alertness: Awake/alert Behavior During Therapy: WFL for tasks assessed/performed Overall Cognitive Status: Within Functional Limits for tasks assessed                                 General Comments: Pt is anxious to leav before 5pm or his friend cannot pick him up.      General Comments      Exercises Other Exercises Other Exercises: 5x sit/stand without assist device, cga/close sup: 28 seconds, indicative of decreased LE strength/power and increased fall risk.  Does require single UE support to complete.   Assessment/Plan    PT Assessment Patient needs continued PT services  PT Problem List Decreased strength;Decreased activity tolerance;Decreased balance;Decreased mobility;Decreased knowledge of use of DME;Decreased coordination       PT Treatment Interventions DME instruction;Gait training;Stair training;Functional mobility training;Therapeutic activities;Therapeutic exercise;Balance training;Patient/family education    PT Goals (Current goals  can be found in the Care Plan section)  Acute Rehab PT Goals Patient Stated Goal: to go home today PT Goal Formulation: With patient Time For Goal Achievement: 09/09/19 Potential to Achieve Goals: Good    Frequency Min 2X/week   Barriers to discharge        Co-evaluation               AM-PAC PT "6 Clicks" Mobility  Outcome Measure Help needed turning from your back to your side while in a flat bed without using bedrails?: None Help needed moving from lying on your back to sitting on the side of a flat bed without using bedrails?: None Help needed moving to and from a bed to a chair (including a wheelchair)?: A Little Help needed standing up from a chair using your arms (e.g., wheelchair or bedside chair)?: A Little Help needed to walk in hospital room?: A Little Help needed climbing 3-5 steps with a railing? : A Little 6 Click Score: 20    End of Session Equipment Utilized During Treatment: Gait belt Activity Tolerance: Patient tolerated treatment well Patient left: in bed;with bed alarm set;with call bell/phone within reach Nurse Communication: Mobility status PT Visit Diagnosis: Muscle weakness (generalized) (M62.81);Difficulty in walking, not elsewhere classified (R26.2)    Time: [id number] PT Time Calculation (min) (ACUTE  ONLY): 16 min   Charges:   PT Evaluation $PT Eval Moderate Complexity: 1 Mod        Esteven Overfelt H. Owens Shark, PT, DPT, NCS 08/26/19, 4:45 PM [phone]

## 2019-08-26 NOTE — Discharge Summary (Signed)
[facility] [place] NAME: Ian [name]    MR#:  [id number]  DATE OF BIRTH:  Mar 19, 1961  DATE OF ADMISSION:  08/25/2019 ADMITTING PHYSICIAN: Otila Back, MD  DATE OF DISCHARGE: 08/26/2019   PRIMARY CARE PHYSICIAN: Zeb Comfort, MD    ADMISSION DIAGNOSIS:  Numbness [R20.0] Weakness [R53.1] CVA (cerebral vascular accident) ([place]) [I63.9]  DISCHARGE DIAGNOSIS:  Acute stroke ruled out.  SECONDARY DIAGNOSIS:   Past Medical History:  Diagnosis Date  . Anxiety   . Barrett esophagus   . Cancer ([place])   . Coronary artery disease   . Depression   . GERD (gastroesophageal reflux disease)   . Hypertension   . Pre-diabetes   . Stroke [facility]) 2014   "mini-stroke" per patient    HOSPITAL COURSE:   Patient came with left-sided weakness and suspected to have acute stroke on admission but his CT scan and MRI on the brain is negative.  The room was done but result is awaited Patient was seen by neurologist who did not feel any central cause for his weakness.  He suggested to follow with physical therapy and if his weakness persist then he should follow with neurologist as outpatient for EMG studies.  DISCHARGE CONDITIONS:   Stable  CONSULTS OBTAINED:  Treatment Team:  Leotis Pain, MD  DRUG ALLERGIES:   Allergies  Allergen Reactions  . Fluoxetine Swelling    Anxiety, itching  . Hydrocodone-Acetaminophen Itching  . Mirtazapine Other (See Comments)    nightmares  . Gabapentin Other (See Comments)    Bad dreams and felt bad  . Tramadol Other (See Comments)    DISCHARGE MEDICATIONS:   Allergies as of 08/26/2019      Reactions   Fluoxetine Swelling   Anxiety, itching   Hydrocodone-acetaminophen Itching   Mirtazapine Other (See Comments)   nightmares   Gabapentin Other (See Comments)   Bad dreams and felt bad   Tramadol Other (See Comments)      Medication List    STOP taking these medications   famotidine 40 MG  tablet Commonly known as: PEPCID   Melatonin 5 MG Tabs     TAKE these medications   albuterol 108 (90 Base) MCG/ACT inhaler Commonly known as: VENTOLIN HFA Inhale 2 puffs into the lungs every 6 (six) hours as needed for wheezing or shortness of breath.   aspirin 81 MG chewable tablet Chew 1 tablet (81 mg total) by mouth daily.   atorvastatin 40 MG tablet Commonly known as: LIPITOR Take 1 tablet (40 mg total) by mouth daily at 6 PM.   busPIRone 10 MG tablet Commonly known as: BUSPAR Take 1 tablet (10 mg total) by mouth 3 (three) times daily.   clonazePAM 0.5 MG tablet Commonly known as: KLONOPIN Take 0.5 mg by mouth daily as needed for anxiety.   cloNIDine 0.1 MG tablet Commonly known as: CATAPRES Take 1 tablet (0.1 mg total) by mouth 2 (two) times daily as needed (BP Systolic > [id number] or Disastolic > [id number]).   escitalopram 20 MG tablet Commonly known as: LEXAPRO Take 20 mg by mouth daily.   Fluticasone-Salmeterol 250-50 MCG/DOSE Aepb Commonly known as: ADVAIR Inhale 1 puff into the lungs every 12 (twelve) hours.   ibuprofen 600 MG tablet Commonly known as: ADVIL Take 1 tablet (600 mg total) by mouth every 6 (six) hours as needed for moderate pain.   lisinopril 20 MG tablet Commonly known as: ZESTRIL Take 1 tablet (20 mg total)  by mouth daily. What changed: how much to take   metFORMIN 500 MG 24 hr tablet Commonly known as: GLUCOPHAGE-XR Take 500 mg by mouth daily.   mometasone-formoterol 200-5 MCG/ACT Aero Commonly known as: DULERA Inhale 2 puffs into the lungs 2 (two) times daily.   omeprazole 20 MG capsule Commonly known as: PRILOSEC Take 20 mg by mouth daily.   QUEtiapine 400 MG 24 hr tablet Commonly known as: SEROQUEL XR Take 400 mg by mouth at bedtime.   QUEtiapine 300 MG 24 hr tablet Commonly known as: SEROQUEL XR Take 1 tablet (300 mg total) by mouth at bedtime.   Sofosbuvir-Velpatasvir 400-100 MG Tabs Commonly known as: Epclusa Take 1 tablet by  mouth daily at 6 PM.   thiamine 100 MG tablet Take 1 tablet (100 mg total) by mouth daily.   zolpidem 10 MG tablet Commonly known as: AMBIEN Take 1 tablet (10 mg total) by mouth at bedtime.        DISCHARGE INSTRUCTIONS:    Follow with neurology as outpatient.  If you experience worsening of your admission symptoms, develop shortness of breath, life threatening emergency, suicidal or homicidal thoughts you must seek medical attention immediately by calling 911 or calling your MD immediately  if symptoms less severe.  You Must read complete instructions/literature along with all the possible adverse reactions/side effects for all the Medicines you take and that have been prescribed to you. Take any new Medicines after you have completely understood and accept all the possible adverse reactions/side effects.   Please note  You were cared for by a hospitalist during your hospital stay. If you have any questions about your discharge medications or the care you received while you were in the hospital after you are discharged, you can call the unit and asked to speak with the hospitalist on call if the hospitalist that took care of you is not available. Once you are discharged, your primary care physician will handle any further medical issues. Please note that NO REFILLS for any discharge medications will be authorized once you are discharged, as it is imperative that you return to your primary care physician (or establish a relationship with a primary care physician if you do not have one) for your aftercare needs so that they can reassess your need for medications and monitor your lab values.    Today   CHIEF COMPLAINT:   Chief Complaint  Patient presents with  . Numbness    HISTORY OF PRESENT ILLNESS:  Ian [name]  is a 58 y.o. male with a known history of hypertension, diabetes mellitus and hyperlipidemia and prior history of CVA with left-sided weakness which previously  resolved.presented to the emergency room with 3-day history of left-sided numbness and some weakness.  More in the left upper extremity.  Patient also complained of nonspecific generalized weakness.  Patient was evaluated in the emergency room and CT scan of the head without contrast done was negative.  Patient also complained of some nonspecific left-sided neck pain and swelling.  MRI of the brain without contrast as well as MRI of the cervical spine already ordered.  Due to persistence of the symptoms over the last 3 days, patient was not a candidate for TPA.  Diagnosed with probable CVA.  Medical service called to admit patient for further evaluation and management.   VITAL SIGNS:  Blood pressure 114/77, pulse 94, temperature 97.6 F (36.4 C), temperature source Oral, resp. rate 20, height 6' (1.829 m), weight 134.5 kg, SpO2 96 %.  I/O:    Intake/Output Summary (Last 24 hours) at 08/26/2019 1615 Last data filed at 08/26/2019 1525 Gross per 24 hour  Intake 1113.98 ml  Output 1675 ml  Net -561.02 ml    PHYSICAL EXAMINATION:  GENERAL:  58 y.o.-year-old patient lying in the bed with no acute distress.  EYES: Pupils equal, round, reactive to light and accommodation. No scleral icterus. Extraocular muscles intact.  HEENT: Head atraumatic, normocephalic. Oropharynx and nasopharynx clear.  NECK:  Supple, no jugular venous distention. No thyroid enlargement, no tenderness.  LUNGS: Normal breath sounds bilaterally, no wheezing, rales,rhonchi or crepitation. No use of accessory muscles of respiration.  CARDIOVASCULAR: S1, S2 normal. No murmurs, rubs, or gallops.  ABDOMEN: Soft, non-tender, non-distended. Bowel sounds present. No organomegaly or mass.  EXTREMITIES: No pedal edema, cyanosis, or clubbing.  NEUROLOGIC: Cranial nerves II through XII are intact. Muscle strength 5/5 in all extremities. Sensation intact. Gait not checked.  PSYCHIATRIC: The patient is alert and oriented x 3.  SKIN: No  obvious rash, lesion, or ulcer.   DATA REVIEW:   CBC Recent Labs  Lab 08/25/19 1215  WBC 10.7*  HGB 15.5  HCT 48.4  PLT 185    Chemistries  Recent Labs  Lab 08/25/19 1215 08/26/19 0442  NA 138 137  K 5.0 4.4  CL 105 107  CO2 22 23  GLUCOSE 240* 169*  BUN 25* 21*  CREATININE 1.34* 1.12  CALCIUM 9.6 8.7*  AST 26  --   ALT 25  --   ALKPHOS 58  --   BILITOT 0.6  --     Cardiac Enzymes No results for input(s): TROPONINI in the last 168 hours.  Microbiology Results  Results for orders placed or performed during the hospital encounter of 08/25/19  SARS Coronavirus 2 [facility] order, Performed in [facility] hospital lab) Nasopharyngeal Nasopharyngeal Swab     Status: None   Collection Time: 08/25/19  5:04 PM   Specimen: Nasopharyngeal Swab  Result Value Ref Range Status   SARS Coronavirus 2 NEGATIVE NEGATIVE Final    Comment: (NOTE) If result is NEGATIVE SARS-CoV-2 target nucleic acids are NOT DETECTED. The SARS-CoV-2 RNA is generally detectable in upper and lower  respiratory specimens during the acute phase of infection. The lowest  concentration of SARS-CoV-2 viral copies this assay can detect is 250  copies / mL. A negative result does not preclude SARS-CoV-2 infection  and should not be used as the sole basis for treatment or other  patient management decisions.  A negative result may occur with  improper specimen collection / handling, submission of specimen other  than nasopharyngeal swab, presence of viral mutation(s) within the  areas targeted by this assay, and inadequate number of viral copies  (<250 copies / mL). A negative result must be combined with clinical  observations, patient history, and epidemiological information. If result is POSITIVE SARS-CoV-2 target nucleic acids are DETECTED. The SARS-CoV-2 RNA is generally detectable in upper and lower  respiratory specimens dur ing the acute phase of infection.  Positive  results are indicative of  active infection with SARS-CoV-2.  Clinical  correlation with patient history and other diagnostic information is  necessary to determine patient infection status.  Positive results do  not rule out bacterial infection or co-infection with other viruses. If result is PRESUMPTIVE POSTIVE SARS-CoV-2 nucleic acids MAY BE PRESENT.   A presumptive positive result was obtained on the submitted specimen  and confirmed on repeat testing.  While 2019 novel coronavirus  (  SARS-CoV-2) nucleic acids may be present in the submitted sample  additional confirmatory testing may be necessary for epidemiological  and / or clinical management purposes  to differentiate between  SARS-CoV-2 and other Sarbecovirus currently known to infect humans.  If clinically indicated additional testing with an alternate test  methodology [phone]) is advised. The SARS-CoV-2 RNA is generally  detectable in upper and lower respiratory sp ecimens during the acute  phase of infection. The expected result is Negative. Fact Sheet for Patients:  StrictlyIdeas.no Fact Sheet for Healthcare Providers: BankingDealers.co.za This test is not yet approved or cleared by the [place] FDA and has been authorized for detection and/or diagnosis of SARS-CoV-2 by FDA under an Emergency Use Authorization (EUA).  This EUA will remain in effect (meaning this test can be used) for the duration of the COVID-19 declaration under Section 564(b)(1) of the Act, 21 U.S.C. section 360bbb-3(b)(1), unless the authorization is terminated or revoked sooner. Performed at [facility], [address] [place], [place] [address]     RADIOLOGY:  Ct Head Wo Contrast  Result Date: 08/25/2019 CLINICAL DATA:  Left-sided tingling for 2 weeks. EXAM: CT HEAD WITHOUT CONTRAST TECHNIQUE: Contiguous axial images were obtained from the base of the skull through the vertex without intravenous contrast.  COMPARISON:  July 12, 2017 FINDINGS: Brain: No evidence of acute infarction, hemorrhage, hydrocephalus, extra-axial collection or mass lesion/mass effect. Mild brain parenchymal volume loss. Vascular: No hyperdense vessel or unexpected calcification. Skull: Normal. Negative for fracture or focal lesion. Sinuses/Orbits: No acute finding. Other: None. IMPRESSION: 1. No acute intracranial abnormality. 2. Mild brain parenchymal atrophy. Electronically Signed   By: Fidela Salisbury M.D.   On: 08/25/2019 12:35   Mr Brain Wo Contrast (neuro Protocol)  Result Date: 08/25/2019 CLINICAL DATA:  Initial evaluation for left-sided numbness and tingling with weakness for 3 days EXAM: MRI HEAD WITHOUT CONTRAST MRI CERVICAL SPINE WITHOUT CONTRAST TECHNIQUE: Multiplanar, multiecho pulse sequences of the brain and surrounding structures, and cervical spine, to include the craniocervical junction and cervicothoracic junction, were obtained without intravenous contrast. COMPARISON:  Prior head CT from earlier same day as well as previous brain MRI from 05/05/2018. FINDINGS: MRI HEAD FINDINGS Brain: Mild age-related cerebral volume loss with chronic microvascular ischemic disease. No abnormal foci of restricted diffusion to suggest acute or subacute ischemia. Gray-white matter differentiation well maintained. No encephalomalacia to suggest chronic infarction. No foci of susceptibility artifact to suggest acute or chronic intracranial hemorrhage. No mass lesion, midline shift or mass effect. No hydrocephalus. No extra-axial fluid collection. Major dural sinuses are grossly patent. Pituitary gland and suprasellar region are normal. Midline structures intact and normal. Vascular: Major intracranial vascular flow voids well maintained and normal in appearance. Skull and upper cervical spine: Craniocervical junction normal. Visualized upper cervical spine within normal limits. Bone marrow signal intensity normal. No scalp soft tissue  abnormality. Sinuses/Orbits: Globes and orbital soft tissues within normal limits. Paranasal sinuses are clear. No mastoid effusion. Inner ear structures normal. Other: None. MRI CERVICAL SPINE FINDINGS Alignment: Straightening of the normal cervical lordosis. No listhesis or subluxation. Vertebrae: Vertebral body height maintained without evidence for acute or chronic fracture. Bone marrow signal intensity within normal limits. No discrete or worrisome osseous lesions. Mild reactive endplate changes about the C4-5 interspace. No other abnormal marrow edema. Cord: Signal intensity within the cervical spinal cord is normal. Normal cord caliber and morphology. Posterior Fossa, vertebral arteries, paraspinal tissues: Craniocervical junction within normal limits. Paraspinous and prevertebral soft tissues are normal.  Normal intravascular flow voids seen within the vertebral arteries bilaterally. Disc levels: C2-C3: Shallow posterior disc bulging. No canal or foraminal stenosis. C3-C4: Shallow broad base central disc protrusion mildly indents the ventral thecal sac, contacting the ventral spinal cord. No cord deformity or significant spinal stenosis. Foramina remain patent. C4-C5: Small central disc protrusion mildly indents the ventral thecal sac, contacting the ventral spinal cord. No cord deformity or significant spinal stenosis. Foramina remain widely patent. C5-C6: Mild disc bulge with uncovertebral hypertrophy. No significant canal or foraminal stenosis. C6-C7: Mild annular disc bulge with uncovertebral hypertrophy. No significant canal or foraminal stenosis. C7-T1:  Unremarkable. Visualized upper thoracic spine demonstrates no significant finding. IMPRESSION: 1. Mild age-related cerebral atrophy with chronic microvascular ischemic disease. Otherwise unremarkable brain MRI with no acute intracranial abnormality identified. 2. Mild noncompressive disc bulging at C3-4 through C6-7 without significant stenosis or cord  impingement. No significant foraminal encroachment within the cervical spine. No findings to explain patient's symptoms identified. Electronically Signed   By: Jeannine Boga M.D.   On: 08/25/2019 20:29   Mr Cervical Spine Wo Contrast  Result Date: 08/25/2019 CLINICAL DATA:  Initial evaluation for left-sided numbness and tingling with weakness for 3 days EXAM: MRI HEAD WITHOUT CONTRAST MRI CERVICAL SPINE WITHOUT CONTRAST TECHNIQUE: Multiplanar, multiecho pulse sequences of the brain and surrounding structures, and cervical spine, to include the craniocervical junction and cervicothoracic junction, were obtained without intravenous contrast. COMPARISON:  Prior head CT from earlier same day as well as previous brain MRI from 05/05/2018. FINDINGS: MRI HEAD FINDINGS Brain: Mild age-related cerebral volume loss with chronic microvascular ischemic disease. No abnormal foci of restricted diffusion to suggest acute or subacute ischemia. Gray-white matter differentiation well maintained. No encephalomalacia to suggest chronic infarction. No foci of susceptibility artifact to suggest acute or chronic intracranial hemorrhage. No mass lesion, midline shift or mass effect. No hydrocephalus. No extra-axial fluid collection. Major dural sinuses are grossly patent. Pituitary gland and suprasellar region are normal. Midline structures intact and normal. Vascular: Major intracranial vascular flow voids well maintained and normal in appearance. Skull and upper cervical spine: Craniocervical junction normal. Visualized upper cervical spine within normal limits. Bone marrow signal intensity normal. No scalp soft tissue abnormality. Sinuses/Orbits: Globes and orbital soft tissues within normal limits. Paranasal sinuses are clear. No mastoid effusion. Inner ear structures normal. Other: None. MRI CERVICAL SPINE FINDINGS Alignment: Straightening of the normal cervical lordosis. No listhesis or subluxation. Vertebrae: Vertebral  body height maintained without evidence for acute or chronic fracture. Bone marrow signal intensity within normal limits. No discrete or worrisome osseous lesions. Mild reactive endplate changes about the C4-5 interspace. No other abnormal marrow edema. Cord: Signal intensity within the cervical spinal cord is normal. Normal cord caliber and morphology. Posterior Fossa, vertebral arteries, paraspinal tissues: Craniocervical junction within normal limits. Paraspinous and prevertebral soft tissues are normal. Normal intravascular flow voids seen within the vertebral arteries bilaterally. Disc levels: C2-C3: Shallow posterior disc bulging. No canal or foraminal stenosis. C3-C4: Shallow broad base central disc protrusion mildly indents the ventral thecal sac, contacting the ventral spinal cord. No cord deformity or significant spinal stenosis. Foramina remain patent. C4-C5: Small central disc protrusion mildly indents the ventral thecal sac, contacting the ventral spinal cord. No cord deformity or significant spinal stenosis. Foramina remain widely patent. C5-C6: Mild disc bulge with uncovertebral hypertrophy. No significant canal or foraminal stenosis. C6-C7: Mild annular disc bulge with uncovertebral hypertrophy. No significant canal or foraminal stenosis. C7-T1:  Unremarkable. Visualized upper thoracic spine  demonstrates no significant finding. IMPRESSION: 1. Mild age-related cerebral atrophy with chronic microvascular ischemic disease. Otherwise unremarkable brain MRI with no acute intracranial abnormality identified. 2. Mild noncompressive disc bulging at C3-4 through C6-7 without significant stenosis or cord impingement. No significant foraminal encroachment within the cervical spine. No findings to explain patient's symptoms identified. Electronically Signed   By: Jeannine Boga M.D.   On: 08/25/2019 20:29   Dg Chest Port 1 View  Result Date: 08/25/2019 CLINICAL DATA:  Chest discomfort EXAM: PORTABLE  CHEST 1 VIEW COMPARISON:  11/05/2018 FINDINGS: No focal opacity or pleural effusion. Stable cardiomediastinal silhouette. No pneumothorax. IMPRESSION: No active disease. Electronically Signed   By: Donavan Foil M.D.   On: 08/25/2019 16:30    EKG:   Orders placed or performed during the hospital encounter of 08/25/19  . ED EKG  . ED EKG      Management plans discussed with the patient, family and they are in agreement.  CODE STATUS: full.    Code Status Orders  (From admission, onward)         Start     Ordered   08/25/19 1811  Full code  Continuous     08/25/19 1813        Code Status History    Date Active Date Inactive Code Status Order ID Comments User Context   12/13/2018 0329 12/23/2018 1536 Full Code [id number]  [name], MD Inpatient   05/04/2018 2118 05/05/2018 2240 Full Code [id number]  Ivor Costa, MD ED   11/05/2017 1640 11/16/2017 1319 Full Code [id number]  [name], MD Inpatient   01/06/2017 2046 01/13/2017 1501 Full Code [id number]  [name], MD Inpatient   09/01/2016 0922 09/01/2016 1313 Full Code [id number]  Harrie Foreman, MD Inpatient   05/03/2015 0425 05/07/2015 1845 Full Code [id number]  Juluis Mire, MD ED   11/16/2013 0235 11/17/2013 1249 Full Code [id number]  Rise Patience, MD Inpatient   Advance Care Planning Activity    Advance Directive Documentation     Most Recent Value  Type of Advance Directive  Living will, Healthcare Power of Attorney  Pre-existing out of facility DNR order (yellow form or pink MOST form)  -  "MOST" Form in Place?  -      TOTAL TIME TAKING CARE OF THIS PATIENT: 35 minutes.    Vaughan Basta M.D on 08/26/2019 at 4:15 PM  Between 7am to 6pm - Pager - [phone]  After 6pm go to [email or web address] - password [email or web address] [place] [facility]  Office  [phone]  CC: Primary care physician; Zeb Comfort, MD   Note: This dictation was prepared with Dragon dictation along with  smaller phrase technology. Any transcriptional errors that result from this process are unintentional.

## 2019-08-26 NOTE — TOC Transition Note (Addendum)
Transition of Care [facility]) - CM/SW Discharge Note   Patient Details  Name: Mana Reuter MRN: [id number] Date of Birth: 1961-03-16  Transition of Care [facility]) CM/SW Contact:  Su Hilt, RN Phone Number: 08/26/2019, 3:52 PM   Clinical Narrative:    Patient lives with friends in a mobile home and sleeps on the couch.  He has a RW, walk in shower, grab bars, single point cane and does not need additional DME He would benefit from [facility].  [facility] without success [facility] has declined accepting the patient as well,  PT has said he can do Out patient, I notified the physician that seems to be the only option. The patient wants to leave no later than 5 PM so he can get a ride home with his friend   Final next level of care: [place] Barriers to Discharge: Barriers Resolved   Patient Goals and CMS Choice Patient states their goals for this hospitalization and ongoing recovery are:: go home CMS Medicare.gov Compare Post Acute Care list provided to:: Patient Choice offered to / list presented to : Patient  Discharge Placement                       Discharge Plan and Services   Discharge Planning Services: CM Consult Post Acute Care Choice: Home Health          DME Arranged: N/A         HH Arranged: PT          Social Determinants of Health (SDOH) Interventions     Readmission Risk Interventions No flowsheet data found.

## 2019-08-26 NOTE — Consult Note (Signed)
Reason for Consult:L sided numbness and weakness  Referring Physician: Dr. Anselm Jungling   CC: L sided weakness/numbness   HPI: Ian [name] is an 58 y.o. male male with a known history of hypertension, diabetes mellitus, hyperlipidemia, prior stroke with left-sided weakness which previously resolved.presented to the emergency room with 3-day history of left-sided numbness and some weakness.  Mostly in the left upper extremity.  Patient also complained of nonspecific generalized weakness.  No acute findings on MRI brain and MRI C spine with chronic bulging but no central canal stenosis.    Past Medical History:  Diagnosis Date  . Anxiety   . Barrett esophagus   . Cancer ([place])   . Coronary artery disease   . Depression   . GERD (gastroesophageal reflux disease)   . Hypertension   . Pre-diabetes   . Stroke [facility]) 2014   "mini-stroke" per patient    Past Surgical History:  Procedure Laterality Date  . ANGIOPLASTY    . APPENDECTOMY    . CARDIAC CATHETERIZATION    . CHOLECYSTECTOMY    . WRIST SURGERY Right    age 55    Family History  Problem Relation Age of Onset  . Stroke Father   . Leukemia Father   . Breast cancer Sister     Social History:  reports that he has been smoking cigarettes. He has been smoking about 1.00 pack per day. He has never used smokeless tobacco. He reports current alcohol use. He reports current drug use. Drugs: Cocaine and Marijuana.  Allergies  Allergen Reactions  . Fluoxetine Swelling    Anxiety, itching  . Hydrocodone-Acetaminophen Itching  . Mirtazapine Other (See Comments)    nightmares  . Gabapentin Other (See Comments)    Bad dreams and felt bad  . Tramadol Other (See Comments)    Medications: I have reviewed the patient's current medications.  ROS: History obtained from the patient  General ROS: negative for - chills, fatigue, fever, night sweats, weight gain or weight loss Psychological ROS: negative for - behavioral disorder,  hallucinations, memory difficulties, mood swings or suicidal ideation Ophthalmic ROS: negative for - blurry vision, double vision, eye pain or loss of vision ENT ROS: negative for - epistaxis, nasal discharge, oral lesions, sore throat, tinnitus or vertigo Allergy and Immunology ROS: negative for - hives or itchy/watery eyes Hematological and Lymphatic ROS: negative for - bleeding problems, bruising or swollen lymph nodes Endocrine ROS: negative for - galactorrhea, hair pattern changes, polydipsia/polyuria or temperature intolerance Respiratory ROS: negative for - cough, hemoptysis, shortness of breath or wheezing Cardiovascular ROS: negative for - chest pain, dyspnea on exertion, edema or irregular heartbeat Gastrointestinal ROS: negative for - abdominal pain, diarrhea, hematemesis, nausea/vomiting or stool incontinence Genito-Urinary ROS: negative for - dysuria, hematuria, incontinence or urinary frequency/urgency Musculoskeletal ROS: negative for - joint swelling or muscular weakness Neurological ROS: as noted in HPI Dermatological ROS: negative for rash and skin lesion changes  Physical Examination: Blood pressure 114/77, pulse 94, temperature 97.6 F (36.4 C), temperature source Oral, resp. rate 20, height 6' (1.829 m), weight 134.5 kg, SpO2 96 %.    Neurological Examination   Mental Status: Alert, oriented, thought content appropriate.  Speech fluent without evidence of aphasia.  Able to follow 3 step commands without difficulty. Cranial Nerves: II: Discs flat bilaterally; Visual fields grossly normal, pupils equal, round, reactive to light and accommodation III,IV, VI: ptosis not present, extra-ocular motions intact bilaterally V,VII: smile symmetric, facial light touch sensation normal bilaterally VIII: hearing  normal bilaterally IX,X: gag reflex present XI: bilateral shoulder shrug XII: midline tongue extension Motor: Right : Upper extremity   Drift     Left:     Upper  extremity   5/5  Lower extremity   Drift but improves when prompted     Lower extremity   5/5 Tone and bulk:normal tone throughout; no atrophy noted Sensory: Pinprick and light touch intact throughout, bilaterally Deep Tendon Reflexes: 1+ and symmetric throughout Plantars: Right: downgoing   Left: downgoing Cerebellar: normal finger-to-nose Gait: not tested      Laboratory Studies:   Basic Metabolic Panel: Recent Labs  Lab 08/25/19 1215  NA 138  K 5.0  CL 105  CO2 22  GLUCOSE 240*  BUN 25*  CREATININE 1.34*  CALCIUM 9.6    Liver Function Tests: Recent Labs  Lab 08/25/19 1215  AST 26  ALT 25  ALKPHOS 58  BILITOT 0.6  PROT 7.3  ALBUMIN 3.9   No results for input(s): LIPASE, AMYLASE in the last 168 hours. No results for input(s): AMMONIA in the last 168 hours.  CBC: Recent Labs  Lab 08/25/19 1215  WBC 10.7*  NEUTROABS 6.6  HGB 15.5  HCT 48.4  MCV 91.8  PLT 185    Cardiac Enzymes: No results for input(s): CKTOTAL, CKMB, CKMBINDEX, TROPONINI in the last 168 hours.  BNP: Invalid input(s): POCBNP  CBG: Recent Labs  Lab 08/25/19 2101 08/26/19 0805  GLUCAP 104* 141*    Microbiology: Results for orders placed or performed during the hospital encounter of 08/25/19  SARS Coronavirus 2 [facility] order, Performed in [facility] hospital lab) Nasopharyngeal Nasopharyngeal Swab     Status: None   Collection Time: 08/25/19  5:04 PM   Specimen: Nasopharyngeal Swab  Result Value Ref Range Status   SARS Coronavirus 2 NEGATIVE NEGATIVE Final    Comment: (NOTE) If result is NEGATIVE SARS-CoV-2 target nucleic acids are NOT DETECTED. The SARS-CoV-2 RNA is generally detectable in upper and lower  respiratory specimens during the acute phase of infection. The lowest  concentration of SARS-CoV-2 viral copies this assay can detect is 250  copies / mL. A negative result does not preclude SARS-CoV-2 infection  and should not be used as the sole basis for treatment  or other  patient management decisions.  A negative result may occur with  improper specimen collection / handling, submission of specimen other  than nasopharyngeal swab, presence of viral mutation(s) within the  areas targeted by this assay, and inadequate number of viral copies  (<250 copies / mL). A negative result must be combined with clinical  observations, patient history, and epidemiological information. If result is POSITIVE SARS-CoV-2 target nucleic acids are DETECTED. The SARS-CoV-2 RNA is generally detectable in upper and lower  respiratory specimens dur ing the acute phase of infection.  Positive  results are indicative of active infection with SARS-CoV-2.  Clinical  correlation with patient history and other diagnostic information is  necessary to determine patient infection status.  Positive results do  not rule out bacterial infection or co-infection with other viruses. If result is PRESUMPTIVE POSTIVE SARS-CoV-2 nucleic acids MAY BE PRESENT.   A presumptive positive result was obtained on the submitted specimen  and confirmed on repeat testing.  While 2019 novel coronavirus  (SARS-CoV-2) nucleic acids may be present in the submitted sample  additional confirmatory testing may be necessary for epidemiological  and / or clinical management purposes  to differentiate between  SARS-CoV-2 and other Sarbecovirus currently known  to infect humans.  If clinically indicated additional testing with an alternate test  methodology [phone]) is advised. The SARS-CoV-2 RNA is generally  detectable in upper and lower respiratory sp ecimens during the acute  phase of infection. The expected result is Negative. Fact Sheet for Patients:  StrictlyIdeas.no Fact Sheet for Healthcare Providers: BankingDealers.co.za This test is not yet approved or cleared by the [place] FDA and has been authorized for detection and/or diagnosis of  SARS-CoV-2 by FDA under an Emergency Use Authorization (EUA).  This EUA will remain in effect (meaning this test can be used) for the duration of the COVID-19 declaration under Section 564(b)(1) of the Act, 21 U.S.C. section 360bbb-3(b)(1), unless the authorization is terminated or revoked sooner. Performed at [facility], [address] [place], [place] [address]     Coagulation Studies: Recent Labs    08/25/19 1215  LABPROT 12.1  INR 0.9    Urinalysis:  Recent Labs  Lab 08/25/19 2105  COLORURINE YELLOW*  LABSPEC 1.016  PHURINE 5.0  GLUCOSEU 50*  HGBUR NEGATIVE  BILIRUBINUR NEGATIVE  KETONESUR NEGATIVE  PROTEINUR NEGATIVE  NITRITE NEGATIVE  LEUKOCYTESUR NEGATIVE    Lipid Panel:     Component Value Date/Time   CHOL 178 08/26/2019 0442   CHOL 135 09/15/2012 0343   TRIG 375 (H) 08/26/2019 0442   TRIG 167 09/15/2012 0343   HDL 23 (L) 08/26/2019 0442   HDL 35 (L) 09/15/2012 0343   CHOLHDL 7.7 08/26/2019 0442   VLDL 75 (H) 08/26/2019 0442   VLDL 33 09/15/2012 0343   LDLCALC 80 08/26/2019 0442   LDLCALC 67 09/15/2012 0343    HgbA1C:  Lab Results  Component Value Date   HGBA1C 7.4 (H) 08/26/2019    Urine Drug Screen:      Component Value Date/Time   LABOPIA NONE DETECTED 08/25/2019 2105   LABOPIA NONE DETECTED 05/04/2018 2025   COCAINSCRNUR POSITIVE (A) 08/25/2019 2105   LABBENZ NONE DETECTED 08/25/2019 2105   LABBENZ NONE DETECTED 05/04/2018 2025   AMPHETMU NONE DETECTED 08/25/2019 2105   AMPHETMU NONE DETECTED 05/04/2018 2025   THCU NONE DETECTED 08/25/2019 2105   THCU NONE DETECTED 05/04/2018 2025   LABBARB NONE DETECTED 08/25/2019 2105   LABBARB NONE DETECTED 05/04/2018 2025    Alcohol Level: No results for input(s): ETH in the last 168 hours.  Other results: EKG: normal EKG, normal sinus rhythm, unchanged from previous tracings.  Imaging: Ct Head Wo Contrast  Result Date: 08/25/2019 CLINICAL DATA:  Left-sided tingling for 2  weeks. EXAM: CT HEAD WITHOUT CONTRAST TECHNIQUE: Contiguous axial images were obtained from the base of the skull through the vertex without intravenous contrast. COMPARISON:  July 12, 2017 FINDINGS: Brain: No evidence of acute infarction, hemorrhage, hydrocephalus, extra-axial collection or mass lesion/mass effect. Mild brain parenchymal volume loss. Vascular: No hyperdense vessel or unexpected calcification. Skull: Normal. Negative for fracture or focal lesion. Sinuses/Orbits: No acute finding. Other: None. IMPRESSION: 1. No acute intracranial abnormality. 2. Mild brain parenchymal atrophy. Electronically Signed   By: Fidela Salisbury M.D.   On: 08/25/2019 12:35   Mr Brain Wo Contrast (neuro Protocol)  Result Date: 08/25/2019 CLINICAL DATA:  Initial evaluation for left-sided numbness and tingling with weakness for 3 days EXAM: MRI HEAD WITHOUT CONTRAST MRI CERVICAL SPINE WITHOUT CONTRAST TECHNIQUE: Multiplanar, multiecho pulse sequences of the brain and surrounding structures, and cervical spine, to include the craniocervical junction and cervicothoracic junction, were obtained without intravenous contrast. COMPARISON:  Prior head CT from earlier  same day as well as previous brain MRI from 05/05/2018. FINDINGS: MRI HEAD FINDINGS Brain: Mild age-related cerebral volume loss with chronic microvascular ischemic disease. No abnormal foci of restricted diffusion to suggest acute or subacute ischemia. Gray-white matter differentiation well maintained. No encephalomalacia to suggest chronic infarction. No foci of susceptibility artifact to suggest acute or chronic intracranial hemorrhage. No mass lesion, midline shift or mass effect. No hydrocephalus. No extra-axial fluid collection. Major dural sinuses are grossly patent. Pituitary gland and suprasellar region are normal. Midline structures intact and normal. Vascular: Major intracranial vascular flow voids well maintained and normal in appearance. Skull and upper  cervical spine: Craniocervical junction normal. Visualized upper cervical spine within normal limits. Bone marrow signal intensity normal. No scalp soft tissue abnormality. Sinuses/Orbits: Globes and orbital soft tissues within normal limits. Paranasal sinuses are clear. No mastoid effusion. Inner ear structures normal. Other: None. MRI CERVICAL SPINE FINDINGS Alignment: Straightening of the normal cervical lordosis. No listhesis or subluxation. Vertebrae: Vertebral body height maintained without evidence for acute or chronic fracture. Bone marrow signal intensity within normal limits. No discrete or worrisome osseous lesions. Mild reactive endplate changes about the C4-5 interspace. No other abnormal marrow edema. Cord: Signal intensity within the cervical spinal cord is normal. Normal cord caliber and morphology. Posterior Fossa, vertebral arteries, paraspinal tissues: Craniocervical junction within normal limits. Paraspinous and prevertebral soft tissues are normal. Normal intravascular flow voids seen within the vertebral arteries bilaterally. Disc levels: C2-C3: Shallow posterior disc bulging. No canal or foraminal stenosis. C3-C4: Shallow broad base central disc protrusion mildly indents the ventral thecal sac, contacting the ventral spinal cord. No cord deformity or significant spinal stenosis. Foramina remain patent. C4-C5: Small central disc protrusion mildly indents the ventral thecal sac, contacting the ventral spinal cord. No cord deformity or significant spinal stenosis. Foramina remain widely patent. C5-C6: Mild disc bulge with uncovertebral hypertrophy. No significant canal or foraminal stenosis. C6-C7: Mild annular disc bulge with uncovertebral hypertrophy. No significant canal or foraminal stenosis. C7-T1:  Unremarkable. Visualized upper thoracic spine demonstrates no significant finding. IMPRESSION: 1. Mild age-related cerebral atrophy with chronic microvascular ischemic disease. Otherwise  unremarkable brain MRI with no acute intracranial abnormality identified. 2. Mild noncompressive disc bulging at C3-4 through C6-7 without significant stenosis or cord impingement. No significant foraminal encroachment within the cervical spine. No findings to explain patient's symptoms identified. Electronically Signed   By: Jeannine Boga M.D.   On: 08/25/2019 20:29   Mr Cervical Spine Wo Contrast  Result Date: 08/25/2019 CLINICAL DATA:  Initial evaluation for left-sided numbness and tingling with weakness for 3 days EXAM: MRI HEAD WITHOUT CONTRAST MRI CERVICAL SPINE WITHOUT CONTRAST TECHNIQUE: Multiplanar, multiecho pulse sequences of the brain and surrounding structures, and cervical spine, to include the craniocervical junction and cervicothoracic junction, were obtained without intravenous contrast. COMPARISON:  Prior head CT from earlier same day as well as previous brain MRI from 05/05/2018. FINDINGS: MRI HEAD FINDINGS Brain: Mild age-related cerebral volume loss with chronic microvascular ischemic disease. No abnormal foci of restricted diffusion to suggest acute or subacute ischemia. Gray-white matter differentiation well maintained. No encephalomalacia to suggest chronic infarction. No foci of susceptibility artifact to suggest acute or chronic intracranial hemorrhage. No mass lesion, midline shift or mass effect. No hydrocephalus. No extra-axial fluid collection. Major dural sinuses are grossly patent. Pituitary gland and suprasellar region are normal. Midline structures intact and normal. Vascular: Major intracranial vascular flow voids well maintained and normal in appearance. Skull and upper cervical spine: Craniocervical junction  normal. Visualized upper cervical spine within normal limits. Bone marrow signal intensity normal. No scalp soft tissue abnormality. Sinuses/Orbits: Globes and orbital soft tissues within normal limits. Paranasal sinuses are clear. No mastoid effusion. Inner ear  structures normal. Other: None. MRI CERVICAL SPINE FINDINGS Alignment: Straightening of the normal cervical lordosis. No listhesis or subluxation. Vertebrae: Vertebral body height maintained without evidence for acute or chronic fracture. Bone marrow signal intensity within normal limits. No discrete or worrisome osseous lesions. Mild reactive endplate changes about the C4-5 interspace. No other abnormal marrow edema. Cord: Signal intensity within the cervical spinal cord is normal. Normal cord caliber and morphology. Posterior Fossa, vertebral arteries, paraspinal tissues: Craniocervical junction within normal limits. Paraspinous and prevertebral soft tissues are normal. Normal intravascular flow voids seen within the vertebral arteries bilaterally. Disc levels: C2-C3: Shallow posterior disc bulging. No canal or foraminal stenosis. C3-C4: Shallow broad base central disc protrusion mildly indents the ventral thecal sac, contacting the ventral spinal cord. No cord deformity or significant spinal stenosis. Foramina remain patent. C4-C5: Small central disc protrusion mildly indents the ventral thecal sac, contacting the ventral spinal cord. No cord deformity or significant spinal stenosis. Foramina remain widely patent. C5-C6: Mild disc bulge with uncovertebral hypertrophy. No significant canal or foraminal stenosis. C6-C7: Mild annular disc bulge with uncovertebral hypertrophy. No significant canal or foraminal stenosis. C7-T1:  Unremarkable. Visualized upper thoracic spine demonstrates no significant finding. IMPRESSION: 1. Mild age-related cerebral atrophy with chronic microvascular ischemic disease. Otherwise unremarkable brain MRI with no acute intracranial abnormality identified. 2. Mild noncompressive disc bulging at C3-4 through C6-7 without significant stenosis or cord impingement. No significant foraminal encroachment within the cervical spine. No findings to explain patient's symptoms identified.  Electronically Signed   By: Jeannine Boga M.D.   On: 08/25/2019 20:29   Dg Chest Port 1 View  Result Date: 08/25/2019 CLINICAL DATA:  Chest discomfort EXAM: PORTABLE CHEST 1 VIEW COMPARISON:  11/05/2018 FINDINGS: No focal opacity or pleural effusion. Stable cardiomediastinal silhouette. No pneumothorax. IMPRESSION: No active disease. Electronically Signed   By: Donavan Foil M.D.   On: 08/25/2019 16:30     Assessment/Plan:  58 y.o. male male with a known history of hypertension, diabetes mellitus, hyperlipidemia, prior stroke with left-sided weakness which previously resolved.presented to the emergency room with 3-day history of left-sided numbness and some weakness.  Mostly in the left upper extremity.  Patient also complained of nonspecific generalized weakness.  No acute findings on MRI brain and MRI C spine with chronic bulging but no central canal stenosis.    - Pt states he is weak but after prompting strength improves.  LLE can't move off the bed but after assisting with lifting he is able to hold it up.  LUE very weak in flexion and extension but when raised passively able to hold up with minimal if any drift.   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  - pt/ot - EMG as out pt if persists.   08/26/2019, 11:05 AM

## 2019-08-26 NOTE — Progress Notes (Signed)
*  PRELIMINARY RESULTS* Echocardiogram 2D Echocardiogram has been performed.  Ian [name] 08/26/2019, 10:21 AM

## 2019-08-26 NOTE — Evaluation (Signed)
Occupational Therapy Evaluation Patient Details Name: Ian [name] MRN: [id number] DOB: December 07, 1961 Today's Date: 08/26/2019    History of Present Illness Patient is 58 year old male who presented to hospital with weakness, neck pain on L and L sided numbness for 3 days.  He has hx of prior CVA with L weakness, bipolar disorder aand several admissions in past year for similar symptoms.  MRI of head was negative and MRI of C spine indicated mild noncompressive disc bulging at C3-4 through C6-7 without stenosis.   Clinical Impression    Pt is 58 year old make who presents with onset of weakness, neck pain on L and L sided numbness for 3 days.  He has hx of prior CVA with L weakness, bipolar disorder aand several admissions in past year for similar symptoms.  MRI of head was negative and MRI of C spine indicated mild noncompressive disc bulging at C3-4 through C6-7 without stenosis.   Pt presents is impulsive and eager to have neck pain addressed and go home.  Provided trigger point release to upper trapezius with facilitation of swelling at base of skull which is not related to findings of MRI of cervical spine.  Educated in active stretches to do at home as well as deep pressure he can do at home to help with pain as well.  Pain decreased from 7/10 to 3/10 at end of session.  He presents with impulsivity when transitioning from lying to sitting at EOB but no loss of balance.  He required min assist to complete LB dressing over L foot but stated he sleeps on couch that is lower than this bed and does it fine and wears shorts all the time and slip on shoes with no socks.   Pt is able to move L UE and hand but has decreased coordination and stamina and received HEP and theraputty exercises to work on at home.   Rec continued OT while in hospital to continue to work on increasing independence in ADLs, balance and functional mobility training, coordination exercises and family ed and training and  outpatient OT after discharge. Discussed rec with PT and SW.    Follow Up Recommendations  Outpatient OT    Equipment Recommendations  Other (comment)(pt needs a new ramp and LVMM for community resource at [facility])    Recommendations for Other Services       Precautions / Restrictions Precautions Precautions: Fall Restrictions Weight Bearing Restrictions: No      Mobility Bed Mobility                  Transfers                      Balance                                           ADL either performed or assessed with clinical judgement   ADL Overall ADL's : Needs assistance/impaired Eating/Feeding: Independent;Set up Eating/Feeding Details (indicate cue type and reason): extra time to open containers Grooming: Wash/dry hands;Wash/dry face;Oral care;Brushing hair;Set up;Minimal assistance Grooming Details (indicate cue type and reason): to open toothpaste         Upper Body Dressing : Minimal assistance;Set up;Sitting   Lower Body Dressing: Minimal assistance;Set up Lower Body Dressing Details (indicate cue type and reason): sitting EOB with cues for  impulsivity and balance         Tub/ Shower Transfer: Minimal assistance;Stand-pivot;Rolling walker     General ADL Comments: Pt has a FWW at home but was not using in mobile home.  He needs a ramp due to his rotting and not safe and has 7 stairs in back entrance and wood rails rotten as well. LVMM for [facility] [employer] about a ramp for him but nobody answered.     Vision Patient Visual Report: No change from baseline       Perception     Praxis      Pertinent Vitals/Pain Pain Assessment: 0-10 Pain Score: 7  Pain Location: neck Pain Descriptors / Indicators: Aching;Constant;Discomfort Pain Intervention(s): Limited activity within patient's tolerance;Monitored during session;Utilized relaxation techniques;Repositioned     Hand Dominance Right    Extremity/Trunk Assessment Upper Extremity Assessment Upper Extremity Assessment: LUE deficits/detail LUE Deficits / Details: Numbness in R UE and hand but can feel most sensation except very light touch, L UE and hand weak but has movement to about 90 degrees shoulder flexion with poor control.  Able to make a fist with L hand and hold phone but unablle lo pronate/supinate and maintain hold. LUE Sensation: decreased light touch LUE Coordination: decreased fine motor;decreased gross motor   Lower Extremity Assessment Lower Extremity Assessment: Defer to PT evaluation       Communication Communication Communication: No difficulties   Cognition Arousal/Alertness: Awake/alert Behavior During Therapy: Anxious;Impulsive Overall Cognitive Status: Within Functional Limits for tasks assessed                                 General Comments: Pt is anxious to leav before 5pm or his friend cannot pick him up.   General Comments       Exercises     Shoulder Instructions      Home Living Family/patient expects to be discharged to:: Private residence   Available Help at Discharge: Friend(s) Type of Home: Mobile home Home Access: Stairs to enter Entrance Stairs-Number of Steps: 7 Entrance Stairs-Rails: (has bilateral railing but ar not stable or functional to use) Home Layout: One level     Bathroom Shower/Tub: Walk-in Hydrologist: Standard Bathroom Accessibility: No   Home Equipment: Environmental consultant - 2 wheels;Cane - single point;Grab bars - tub/shower   Additional Comments: sleeps on couch      Prior Functioning/Environment Level of Independence: Independent                 OT Problem List: Decreased strength;Decreased range of motion;Decreased activity tolerance;Impaired balance (sitting and/or standing);Pain;Decreased safety awareness;Decreased coordination;Impaired UE functional use      OT Treatment/Interventions: Self-care/ADL  training;Patient/family education;Neuromuscular education;Therapeutic exercise;Balance training    OT Goals(Current goals can be found in the care plan section) Acute Rehab OT Goals Patient Stated Goal: to go home today OT Goal Formulation: With patient Time For Goal Achievement: 08/26/19 Potential to Achieve Goals: Good ADL Goals Pt Will Perform Lower Body Dressing: with set-up;with supervision;sit to/from stand Pt Will Transfer to Toilet: with set-up;with supervision;regular height toilet;stand pivot transfer Pt/caregiver will Perform Home Exercise Program: With written HEP provided;Left upper extremity;With theraputty  OT Frequency: Min 1X/week   Barriers to D/C:    lives at home alone       Co-evaluation              AM-PAC OT "6 Clicks" Daily Activity  Outcome Measure Help from another person eating meals?: A Little Help from another person taking care of personal grooming?: None Help from another person toileting, which includes using toliet, bedpan, or urinal?: A Little Help from another person bathing (including washing, rinsing, drying)?: A Little Help from another person to put on and taking off regular upper body clothing?: None Help from another person to put on and taking off regular lower body clothing?: A Little 6 Click Score: 20   End of Session Equipment Utilized During Treatment: Gait belt  Activity Tolerance: Patient tolerated treatment well Patient left: in bed;with call bell/phone within reach;with bed alarm set  OT Visit Diagnosis: Unsteadiness on feet (R26.81);Muscle weakness (generalized) (M62.81);Pain Pain - part of body: (neck)                Time: 1425-1500 OT Time Calculation (min): 35 min Charges:  OT General Charges $OT Visit: 1 Visit OT Evaluation $OT Eval Low Complexity: 1 Low OT Treatments $Self Care/Home Management : 8-22 mins $Therapeutic Activity: 8-22 mins  Chrys Racer, OTR/L, [place] ascom [phone] 08/26/19, 3:44  PM

## 2019-08-26 NOTE — TOC Transition Note (Signed)
Transition of Care [facility]) - CM/SW Discharge Note   Patient Details  Name: Ian [name] MRN: [id number] Date of Birth: 02/01/1961  Transition of Care [facility]) CM/SW Contact:  Su Hilt, RN Phone Number: 08/26/2019, 4:35 PM   Clinical Narrative:    Provided the patient with the Outpatient referral for PT/OT signed by the Physician to take with him to get the appointment for Outpatient PT, He agreed to call on Tuesday when the office opens and make the appointment No further needs   Final next level of care: Home w Home Health Services Barriers to Discharge: Barriers Resolved   Patient Goals and CMS Choice Patient states their goals for this hospitalization and ongoing recovery are:: go home CMS Medicare.gov Compare Post Acute Care list provided to:: Patient Choice offered to / list presented to : Patient  Discharge Placement                       Discharge Plan and Services   Discharge Planning Services: CM Consult Post Acute Care Choice: Home Health          DME Arranged: N/A         HH Arranged: PT          Social Determinants of Health (SDOH) Interventions     Readmission Risk Interventions No flowsheet data found.

## 2019-08-27 LAB — HIV ANTIBODY (ROUTINE TESTING W REFLEX): HIV Screen 4th Generation wRfx: NONREACTIVE

## 2019-08-28 LAB — ECHOCARDIOGRAM COMPLETE
Height: 72 in
Weight: 4744.3 oz

## 2019-09-02 IMAGING — CR DG CHEST 2V
1 series · 2 of 2 positions shown · non-contrast
Comparison: 05/13/2018

CLINICAL DATA: Chest pain

EXAM:
CHEST - 2 VIEW

[Series 1: dg chest 2 view · 0.14mm/px · 2 of 2 slices shown]
[im 1/2]
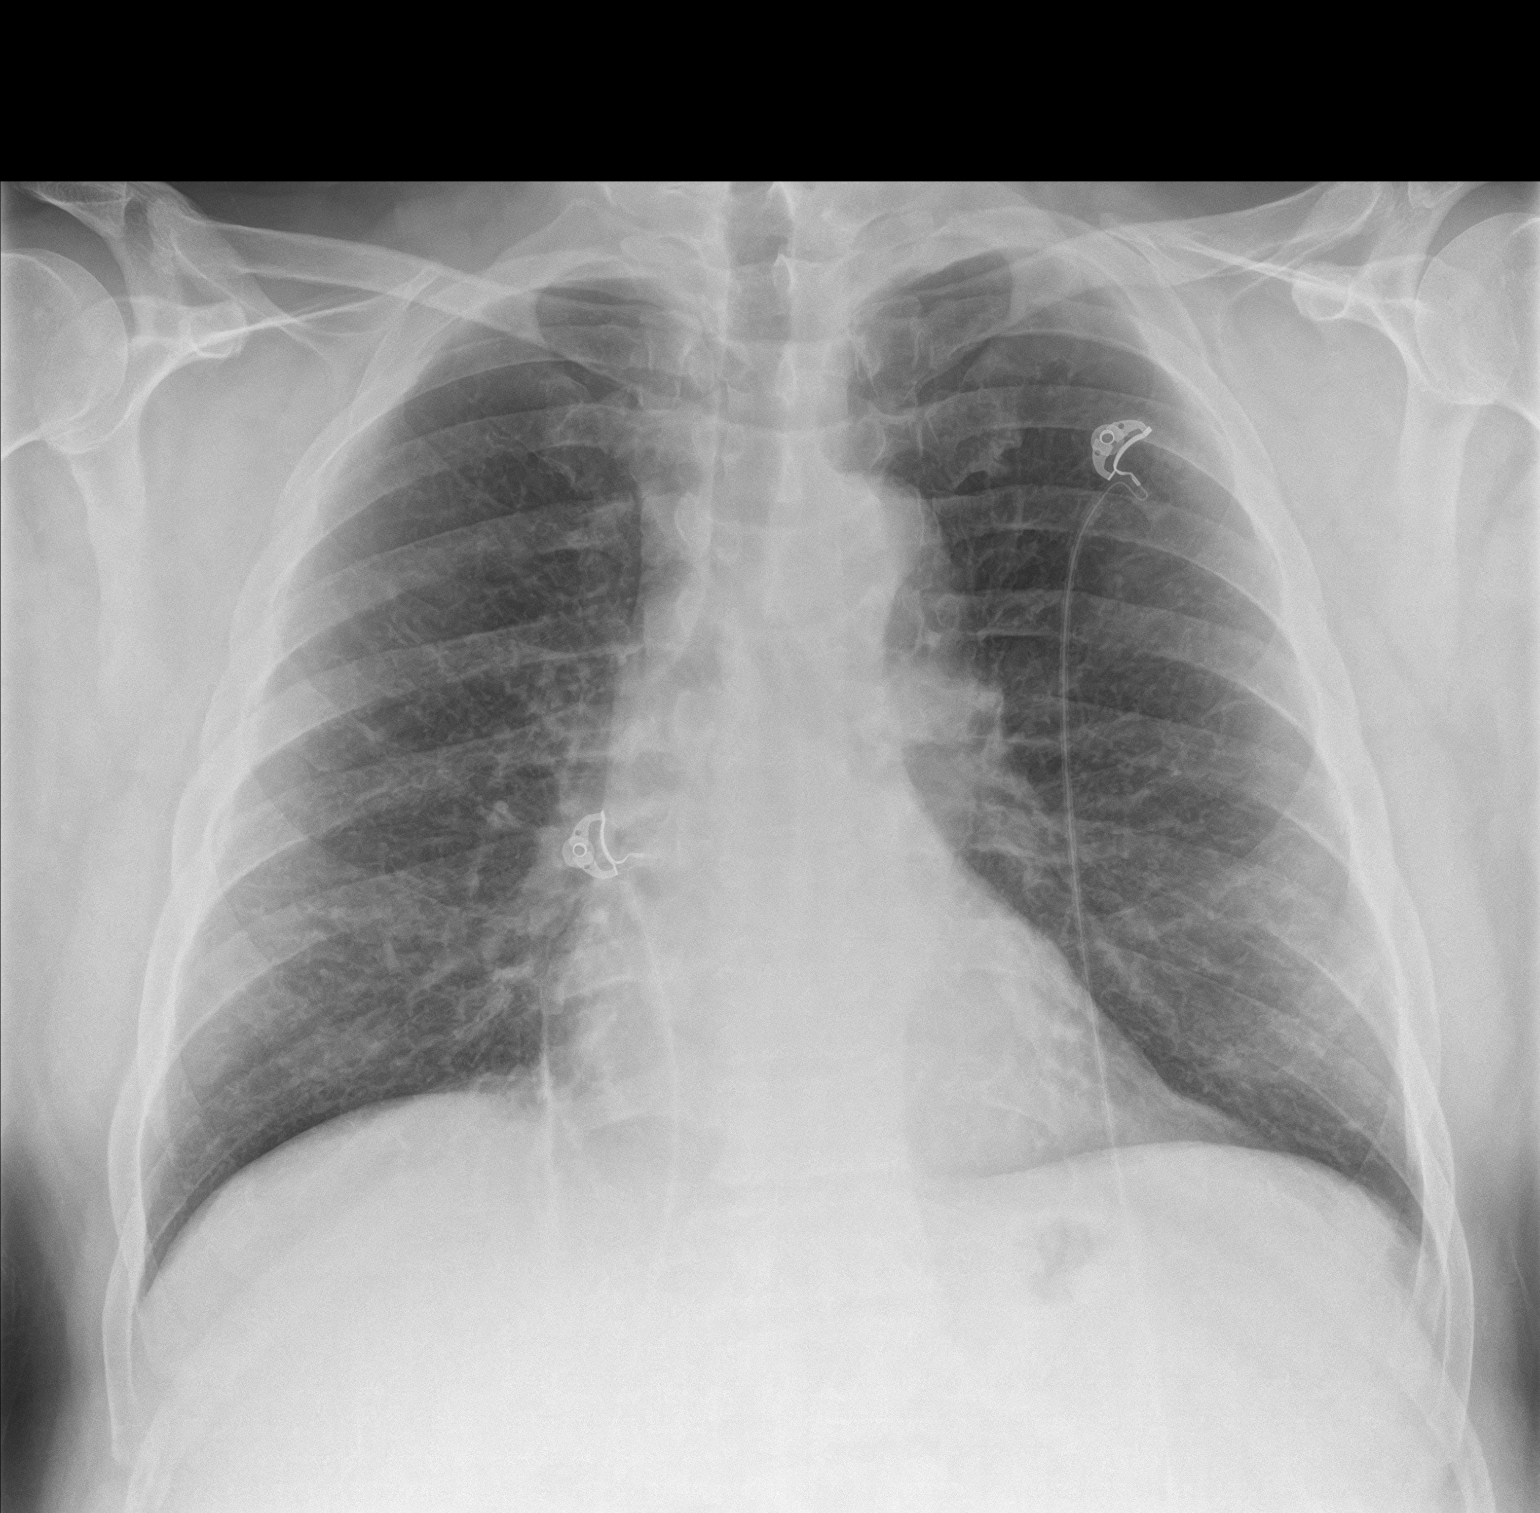
[im 2/2]
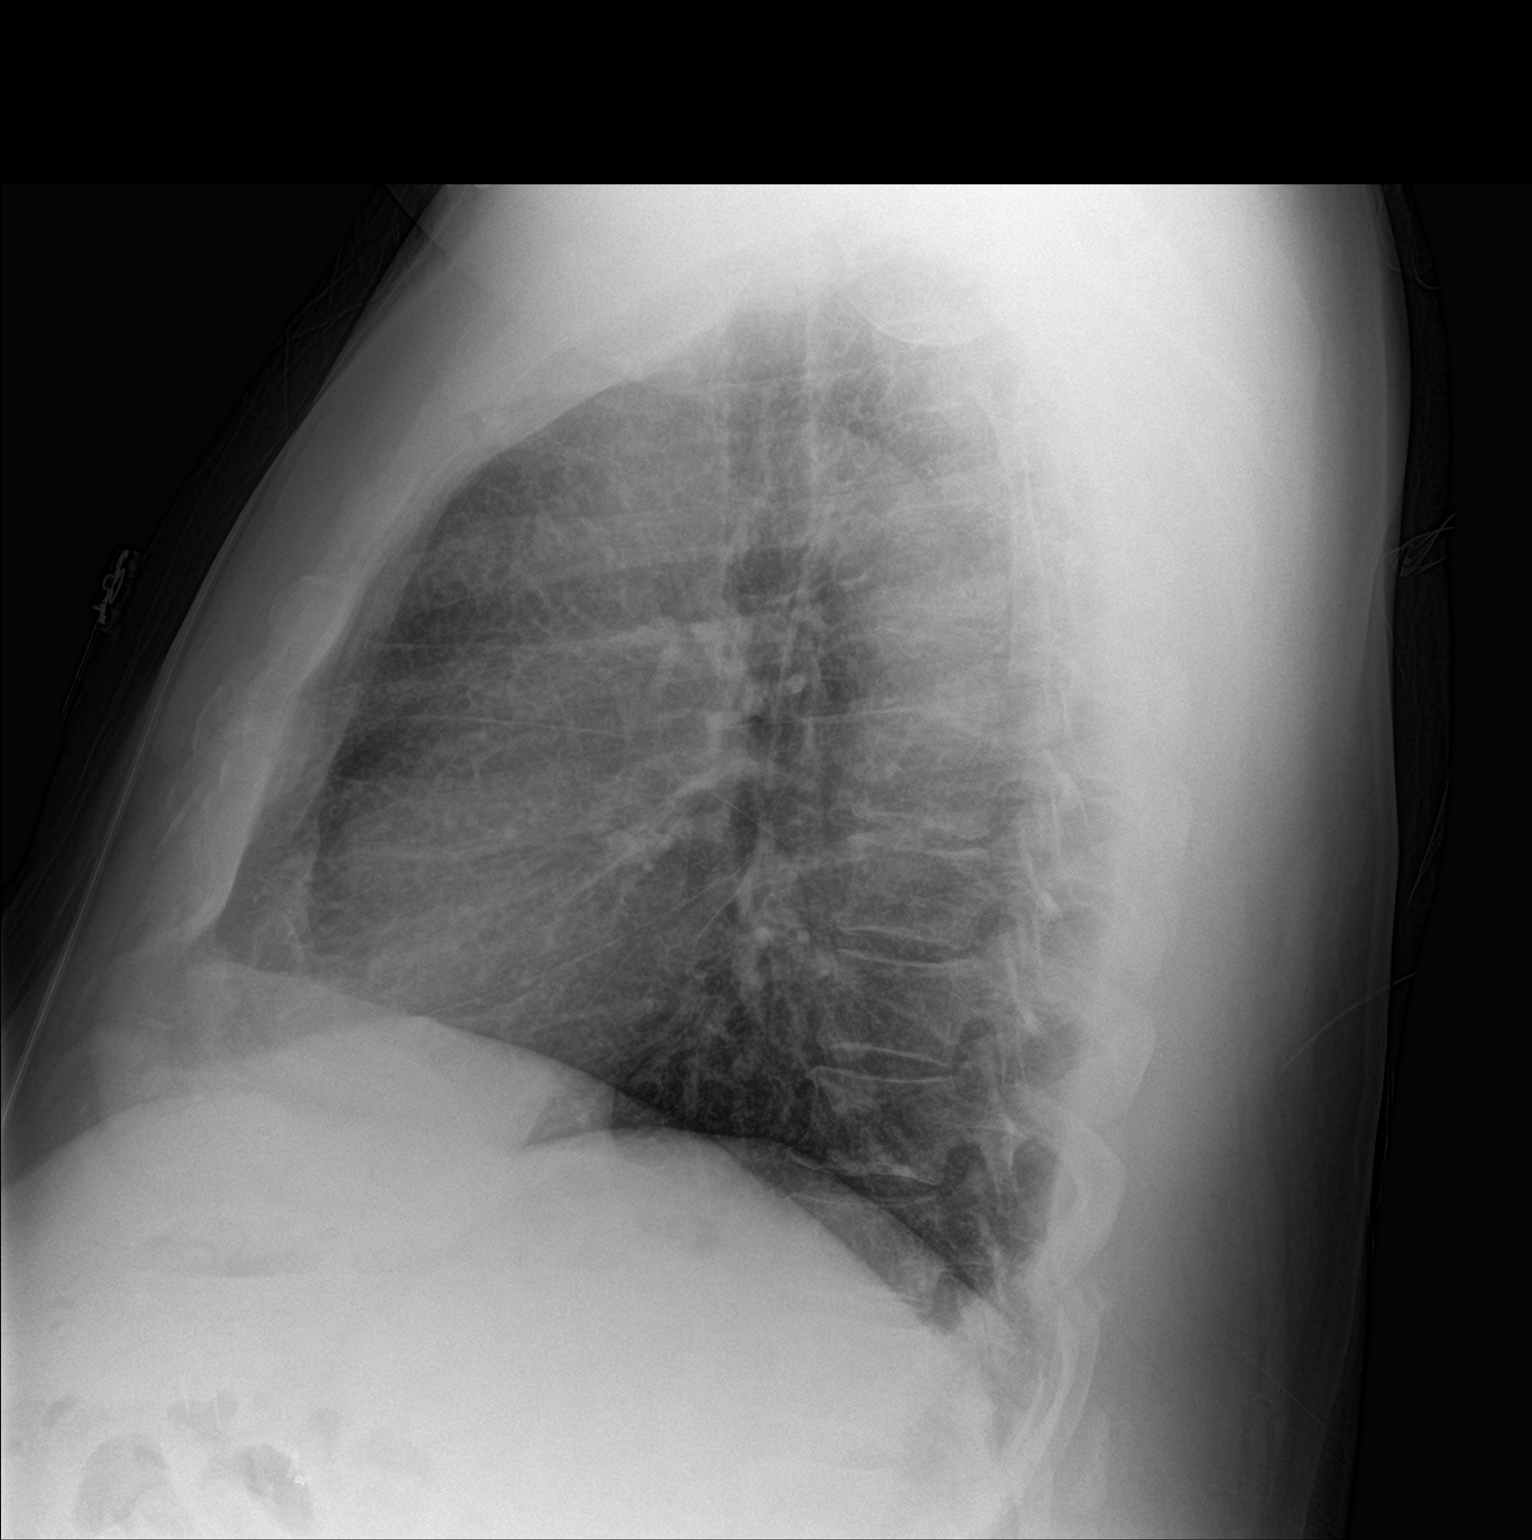

[2 of 2 positions shown; findings below may reference images not displayed]

FINDINGS: Stable heart size and mediastinal contours. There is slight
uncoiling of the thoracic aorta with aortic atherosclerosis. No
acute pulmonary consolidation or CHF. No effusion or pneumothorax.
No acute osseous abnormality. Mild degenerative change about the
dorsal spine
IMPRESSION: No active cardiopulmonary disease. Aortic atherosclerosis.

## 2019-10-05 ENCOUNTER — Ambulatory Visit: Payer: Medicaid Other | Attending: Family Medicine | Admitting: Occupational Therapy

## 2019-10-05 ENCOUNTER — Other Ambulatory Visit: Payer: Self-pay

## 2019-10-05 ENCOUNTER — Encounter: Payer: Self-pay | Admitting: Occupational Therapy

## 2019-10-05 DIAGNOSIS — R278 Other lack of coordination: Secondary | ICD-10-CM | POA: Diagnosis present

## 2019-10-05 DIAGNOSIS — M6281 Muscle weakness (generalized): Secondary | ICD-10-CM | POA: Insufficient documentation

## 2019-10-05 NOTE — Therapy (Signed)
[place] MAIN [facility] SERVICES [address] [place], [place], [address] Phone: [phone]   Fax:  [phone]  Occupational Therapy Evaluation  Patient Details  Name: Ian [name] MRN: [id number] Date of Birth: [birth year] Referring Provider (OT): Consuelo Pandy   Encounter Date: 10/05/2019  OT End of Session - 10/05/19 1254    Visit Number  1    Number of Visits  24    Date for OT Re-Evaluation  12/28/19    Authorization Type  Progress report period starting 10/05/2019    OT Start Time  0830    OT Stop Time  0915    OT Time Calculation (min)  45 min    Activity Tolerance  Patient tolerated treatment well    Behavior During Therapy  [facility] for tasks assessed/performed       Past Medical History:  Diagnosis Date  . Anxiety   . Barrett esophagus   . Cancer ([place])   . Coronary artery disease   . Depression   . GERD (gastroesophageal reflux disease)   . Hypertension   . Pre-diabetes   . Stroke [facility]) 2014   "mini-stroke" per patient    Past Surgical History:  Procedure Laterality Date  . ANGIOPLASTY    . APPENDECTOMY    . CARDIAC CATHETERIZATION    . CHOLECYSTECTOMY    . WRIST SURGERY Right    age 1    There were no vitals filed for this visit.  Subjective Assessment - 10/05/19 1215    Subjective   Pt. reports he just recently got approved for section 8 housing, as her rpoerts his current home is in disrepair.    Pertinent History  Pt.  is a 58 y.o. male who was admitted to [facility] on 08/25/2019 with LUE/LE weakness, numbness, and tingling. MRI of the C spine revealed Mild noncompressive disc bulging at C3-4 through C6-7 without stenosis. Pt. has a history of CVA in 2014. Pt. currently resides at home alone, in a mobile home.    Currently in Pain?  Yes    Pain Score  8     Pain Location  Back    Pain Orientation  Lower    Pain Descriptors / Indicators  Aching;Tingling        [facility] OT Assessment - 10/05/19 0001       Assessment   Medical Diagnosis  LUE weakness    Referring Provider (OT)  Pricilla Riffle, [place]    Onset Date/Surgical Date  08/25/19    Hand Dominance  Right      Precautions   Precautions  None      Restrictions   Weight Bearing Restrictions  No      Balance Screen   Has the patient fallen in the past 6 months  Yes    How many times?  100    Has the patient had a decrease in activity level because of a fear of falling?   Yes    Is the patient reluctant to leave their home because of a fear of falling?   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   Leisure  Golfing, mowing      ADL   Eating/Feeding  Independent    Grooming  Minimal assistance    Upper Body Bathing  Supervision/safety   When standing washing hair with eyes closed   Lower Body Bathing  Minimal assistance    Upper Body Dressing  Independent    Lower Body Dressing  Minimal assistance    Banker - Astronomer -  Control and instrumentation engineer  Modified independent    Equipment Used  --   Builtin shower chair     IADL   Prior Level of Patent attorney independently for small purchases;Needs to be accompanied on any shopping trip    Prior Level of Function Light Housekeeping  Independent   Independent   Light Housekeeping  Does not participate in any housekeeping tasks    Prior Level of Function Meal Prep  Independent    Meal Prep  Able to complete simple warm meal prep    Microwave meals only   Prior Level of Function Sales executive  Relies on family or friends for transportation    Prior Level of Function Medication Managment  Independent   Independent   Medication Management  Is responsible for taking medication in correct dosages at correct time    Prior Level of Function Lexicographer financial matters independently (budgets, writes checks, pays rent, bills goes to bank), collects and keeps track of income      Mobility   Mobility Status  History of falls    Mobility Status Comments  --   Low Back pian, cruises furniture.     Written Expression   Dominant Hand  Right    Handwriting  75% legible      Vision - History   Baseline Vision  Wears glasses all the time      Cognition   Overall Cognitive Status  Within Functional Limits for tasks assessed      Sensation   Light Touch  Appears Intact   Constant tingling   Stereognosis  Appears Intact      Coordination   Gross Motor Movements are Fluid and Coordinated  Yes    Fine Motor Movements are Fluid and Coordinated  No    Right 9 Hole Peg Test  34    Left 9 Hole Peg Test  32      Strength   Overall Strength Comments  LUE shoulder flexion, abduction 4-/5, elbow flexion, extension, wrist flexion, extension 4/5. RUE: 4/5 shoulder flexion, abduction, 5/5 elbow flexion, extension wrist flexion, extension      Hand Function   Right Hand Grip (lbs)  65    Right Hand Lateral Pinch  25 lbs    Right Hand 3 Point Pinch  20 lbs    Left Hand Grip (lbs)  12    Left Hand Lateral Pinch  11 lbs    Left 3 point pinch  13 lbs  OT Education - 10/05/19 1253    Education Details  OT services, POC, goals, LUE functioning    Person(s) Educated  Patient    Methods  Explanation    Comprehension  Verbalized understanding          OT Long Term Goals - 10/05/19 1548      OT  LONG TERM GOAL #1   Title  Pt. will improve LUE strength by 2 mm grades to assist with ADLS, and IADLs.    Baseline  Eval: Limited Left UE strength    Time  12    Period  Weeks    Status  New    Target Date  12/28/19      OT LONG TERM GOAL #2   Title  Pt. will improve Left grip strength by 10# to be able to hold a pour a beverage    Baseline  Eval: Limited grip strength with difficulty holding objects.    Time  12    Period  Weeks    Status  New    Target Date  12/28/19      OT LONG TERM GOAL #3   Title  Pt. will improve left [facility] skills to be able to handle, and count change independently..    Baseline  Eval: Pt. has difficulty    Time  12    Period  Weeks    Status  New    Target Date  12/28/19      OT LONG TERM GOAL #4   Title  Pt. will be independently tie his shoes    Baseline  Eval: Pt. is unable    Time  12    Period  Weeks    Target Date  12/28/19      OT LONG TERM GOAL #5   Title  Pt. will be independent with LE dressing skills    Baseline  Eval: Pt. has difficulty    Time  12    Period  Weeks    Target Date  12/28/19            Plan - 10/05/19 1255    Clinical Impression Statement  Pt. is a 67.y.o male who presents with LUE weakness, numbness, and tingling which limits his ability to use it assist with tying shoes, handling small coins, and pouring a drink from a pitcher. Pt. sum score is 72/80 on the MAM-20 for Patients with Neurological Conditions. Pt. has difficulty performing LUE dressing tasks efficiently. Pt. will benefit from skilled OT services to work on improving LUE functioning in order to be able to engage in daily ADL, and IADL tasks. Pt. reports that he currently does not participate in any home management, or house cleaning tasks. Pt. reports that his home is in too much of a state of disrepair that he has just stopped. Pt. reports that he has been approved for section 8 housing, and plans to move once everything everything has been finalized.     OT Occupational Profile and History  Problem Focused Assessment - Including review of records relating to presenting problem    Occupational performance deficits (Please refer to evaluation for details):  ADL's;IADL's    Body Structure / Function / Physical Skills  ADL;FMC;IADL;Coordination;UE functional use    Rehab Potential  Good    Clinical Decision Making  Several treatment options, min-mod task modification necessary    Comorbidities Affecting Occupational Performance:  May have comorbidities impacting occupational performance    Modification or Assistance to  Complete Evaluation   Min-Moderate modification of tasks or assist with assess necessary to complete eval    OT Frequency  2x / week    OT Duration  12 weeks    OT Treatment/Interventions  Self-care/ADL training;Therapeutic exercise;DME and/or AE instruction;Patient/family education;Therapeutic activities;Neuromuscular education    Consulted and Agree with Plan of Care  Patient       Patient will benefit from skilled therapeutic intervention in order to improve the following deficits and impairments:   Body Structure / Function / Physical Skills: ADL, FMC, IADL, Coordination, UE functional use       Visit Diagnosis: Muscle weakness (generalized)  Other lack of coordination    Problem List Patient Active Problem List   Diagnosis Date Noted  . CVA (cerebral vascular accident) ([place]) 08/25/2019  . Major depression, recurrent, chronic ([place]) 12/13/2018  . Hepatitis C 12/13/2018  . Leukocytosis 05/04/2018  . Severe recurrent major depression without psychotic features ([place]) 11/05/2017  . Dyslipidemia 01/07/2017  . Barrett's esophagus 01/07/2017  . Bipolar I disorder, most recent episode depressed with anxious distress ([place]) 01/06/2017  . Cannabis use disorder, moderate, dependence ([place]) 01/06/2017  . Tobacco use disorder 01/06/2017  . Alcohol use disorder, moderate, dependence ([place]) 10/06/2016  . Substance induced mood  disorder ([place]) 10/06/2016  . Cocaine use disorder, moderate, dependence ([place]) 10/06/2016  . Depression 10/06/2016  . Acute left-sided weakness 05/03/2015  . CVA (cerebral infarction) 05/03/2015  . Weakness 11/16/2013  . Chest pain 11/16/2013  . HTN (hypertension) 11/16/2013  . Left-sided weakness 11/16/2013  . Torsades de pointes ([place]) 11/16/2013  . Hemiplegia, unspecified, affecting nondominant side 11/15/2013  . Speech and language deficits 11/15/2013    Harrel Carina, MS, OTR/L 10/05/2019, 4:02 PM  [place] MAIN [facility] SERVICES [address] [place], [place], [address] Phone: [phone]   Fax:  [phone]  Name: Ian [name] MRN: [id number] Date of Birth: 1961/02/24

## 2019-10-10 ENCOUNTER — Ambulatory Visit: Payer: Medicaid Other | Admitting: Occupational Therapy

## 2019-10-12 ENCOUNTER — Ambulatory Visit: Payer: Medicaid Other | Admitting: Occupational Therapy

## 2019-10-17 ENCOUNTER — Ambulatory Visit: Payer: Medicaid Other | Admitting: Occupational Therapy

## 2019-10-19 ENCOUNTER — Ambulatory Visit: Payer: Medicaid Other | Admitting: Occupational Therapy

## 2019-10-24 ENCOUNTER — Ambulatory Visit: Payer: Medicaid Other | Admitting: Occupational Therapy

## 2019-10-26 ENCOUNTER — Ambulatory Visit: Payer: Medicaid Other | Admitting: Occupational Therapy

## 2019-10-31 ENCOUNTER — Other Ambulatory Visit: Payer: Self-pay

## 2019-10-31 ENCOUNTER — Ambulatory Visit: Payer: Medicaid Other | Attending: Family Medicine | Admitting: Occupational Therapy

## 2019-10-31 ENCOUNTER — Encounter: Payer: Self-pay | Admitting: Occupational Therapy

## 2019-10-31 ENCOUNTER — Telehealth: Admit: 2019-10-31 | Discharge: 2019-11-01 | Payer: MEDICAID | Attending: Family | Primary: Family

## 2019-10-31 DIAGNOSIS — R278 Other lack of coordination: Secondary | ICD-10-CM | POA: Insufficient documentation

## 2019-10-31 DIAGNOSIS — M6281 Muscle weakness (generalized): Secondary | ICD-10-CM | POA: Insufficient documentation

## 2019-10-31 NOTE — Therapy (Signed)
[place] MAIN [facility] SERVICES [address] [place], [place], [address] Phone: [phone]   Fax:  [phone]  Occupational Therapy Treatment  Patient Details  Name: Ian [name] MRN: [id number] Date of Birth: [birth year] Referring Provider (OT): Consuelo Pandy   Encounter Date: 10/31/2019  OT End of Session - 10/31/19 1524    Visit Number  2    Number of Visits  24    Date for OT Re-Evaluation  12/28/19    Authorization Type  Progress report period starting 10/05/2019    OT Start Time  1150    OT Stop Time  1230    OT Time Calculation (min)  40 min    Activity Tolerance  Patient tolerated treatment well    Behavior During Therapy  [facility] for tasks assessed/performed       Past Medical History:  Diagnosis Date  . Anxiety   . Barrett esophagus   . Cancer ([place])   . Coronary artery disease   . Depression   . GERD (gastroesophageal reflux disease)   . Hypertension   . Pre-diabetes   . Stroke [facility]) 2014   "mini-stroke" per patient    Past Surgical History:  Procedure Laterality Date  . ANGIOPLASTY    . APPENDECTOMY    . CARDIAC CATHETERIZATION    . CHOLECYSTECTOMY    . WRIST SURGERY Right    age 41    There were no vitals filed for this visit.  Subjective Assessment - 10/31/19 1521    Subjective   Pt. has back pain    Pertinent History  Pt.  is a 58 y.o. male who was admitted to [facility] on 08/25/2019 with LUE/LE weakness, numbness, and tingling. MRI of the C spine revealed Mild noncompressive disc bulging at C3-4 through C6-7 without stenosis. Pt. has a history of CVA in 2014. Pt. currently resides at home alone, in a mobile home.    Currently in Pain?  Yes    Pain Score  8     Pain Location  Back    Pain Orientation  Lower    Pain Descriptors / Indicators  Aching;Tingling      OT TREATMENT    \ Therapeutic Exercise:  Pt. performed gross gripping with grip strengthener. Pt. worked on sustaining grip while grasping pegs  and reaching at various heights. The Gripper was set at 36.9# of grip strength force.  Pt. worked on green thearputty ex. for hand strengthening. Exercises included: gross gripping, gross digit extension, thumb abduction, lateral, and 3pt. pinch strengthening, digit abduction, and thumb opposition. Pt. was provided with a visual handout HEP.  Neuro muscular re-education:  Pt. performed [facility] tasks using the grooved pegboard. Pt. worked on grasping the grooved pegs from a horizontal position, and moving the pegs to a vertical position in the hand to prepare for placing them in the grooved slot.   Self-care:  Pt. education was provided about A/E use for LE ADLs. Pt. was provided with a reacher, as pt. is unable to reach his LEs.  Pt. has missed multiple treatment sessions secondary to transportation, and housing issues. Pt. reports that he is working out his transpaotration, and is taking [employer] to come to therapy. Pt. is waiting for his housing approval. pt. conintues to work towards the same goals established at the initial evaluation as pt. has not returned to therapy since. Pt. continues to present with decreased strength, and [place] skills in the LUE, as well as limited LE dressing  tasks. Pt. continues to work on improving these skills in order to improve ADL, and IADL functioning, and maximize overall independence.                          OT Education - 10/31/19 1524    Education Details  OT services, LUE functioning    Person(s) Educated  Patient    Methods  Explanation    Comprehension  Verbalized understanding          OT Long Term Goals - 10/31/19 1155      OT LONG TERM GOAL #1   Title  Pt. will improve LUE strength by 2 mm grades to assist with ADLS, and IADLs.    Baseline  Limited Left UE strength    Time  12    Period  Weeks    Status  On-going    Target Date  12/28/19      OT LONG TERM GOAL #2   Title  Pt. will improve Left grip strength by 10# to be  able to hold a pour a beverage    Baseline  Limited grip strength with difficulty holding objects.    Time  12    Period  Weeks    Status  On-going    Target Date  12/28/19      OT LONG TERM GOAL #3   Title  Pt. will improve left [facility] skills to be able to handle, and count change independently..    Baseline  Pt. has difficulty    Time  12    Period  Weeks    Status  On-going    Target Date  12/28/19      OT LONG TERM GOAL #4   Title  Pt. will be independently tie his shoes    Baseline  Pt. is unable    Time  12    Period  Weeks    Status  On-going    Target Date  12/28/19      OT LONG TERM GOAL #5   Title  Pt. will be independent with LE dressing skills    Baseline  Pt. conitnues to have  difficulty    Time  12    Period  Weeks    Status  On-going    Target Date  12/28/19            Plan - 10/31/19 1526    Clinical Impression Statement  Pt. has missed multiple treatment sessions secondary to transportation, and housing issues. Pt. reports that he is working out his transpaotration, and is taking [employer]A to come to therapy. Pt. is waiting for his housing approval. pt. conintues to work towards the same goals established at the initial evaluation as pt. has not returned to therapy since. Pt. continues to present with decreased strength, and [place] skills in the LUE, as well as limited LE dressing tasks. Pt. continues to work on improving these skills in order to improve ADL, and IADL functioning, and maximize overall independence.    OT Occupational Profile and History  Problem Focused Assessment - Including review of records relating to presenting problem    Occupational performance deficits (Please refer to evaluation for details):  ADL's;IADL's    Body Structure / Function / Physical Skills  ADL;FMC;IADL;Coordination;UE functional use    Rehab Potential  Good    Clinical Decision Making  Several treatment options, min-mod task modification necessary    Comorbidities Affecting  Occupational Performance:  May have comorbidities impacting occupational performance    Modification or Assistance to Complete Evaluation   Min-Moderate modification of tasks or assist with assess necessary to complete eval    OT Frequency  2x / week    OT Duration  12 weeks    OT Treatment/Interventions  Self-care/ADL training;Therapeutic exercise;DME and/or AE instruction;Patient/family education;Therapeutic activities;Neuromuscular education    Consulted and Agree with Plan of Care  Patient       Patient will benefit from skilled therapeutic intervention in order to improve the following deficits and impairments:   Body Structure / Function / Physical Skills: ADL, FMC, IADL, Coordination, UE functional use       Visit Diagnosis: Muscle weakness (generalized)  Other lack of coordination    Problem List Patient Active Problem List   Diagnosis Date Noted  . CVA (cerebral vascular accident) ([place]) 08/25/2019  . Major depression, recurrent, chronic ([place]) 12/13/2018  . Hepatitis C 12/13/2018  . Leukocytosis 05/04/2018  . Severe recurrent major depression without psychotic features ([place]) 11/05/2017  . Dyslipidemia 01/07/2017  . Barrett's esophagus 01/07/2017  . Bipolar I disorder, most recent episode depressed with anxious distress ([place]) 01/06/2017  . Cannabis use disorder, moderate, dependence ([place]) 01/06/2017  . Tobacco use disorder 01/06/2017  . Alcohol use disorder, moderate, dependence ([place]) 10/06/2016  . Substance induced mood disorder ([place]) 10/06/2016  . Cocaine use disorder, moderate, dependence ([place]) 10/06/2016  . Depression 10/06/2016  . Acute left-sided weakness 05/03/2015  . CVA (cerebral infarction) 05/03/2015  . Weakness 11/16/2013  . Chest pain 11/16/2013  . HTN (hypertension) 11/16/2013  . Left-sided weakness 11/16/2013  . Torsades de pointes ([place]) 11/16/2013  . Hemiplegia, unspecified, affecting nondominant side 11/15/2013  . Speech and language deficits  11/15/2013    Harrel Carina, MS, OTR/L 10/31/2019, 3:42 PM  [place] MAIN [facility] SERVICES [address] [place], [place], [address] Phone: [phone]   Fax:  [phone]  Name: Ian [name] MRN: [id number] Date of Birth: May 02, 1961

## 2019-11-02 ENCOUNTER — Ambulatory Visit: Payer: Medicaid Other | Admitting: Occupational Therapy

## 2019-11-07 ENCOUNTER — Encounter: Payer: Medicaid Other | Admitting: Occupational Therapy

## 2019-11-09 ENCOUNTER — Ambulatory Visit: Payer: Medicaid Other | Admitting: Occupational Therapy

## 2019-11-14 ENCOUNTER — Encounter: Payer: Medicaid Other | Admitting: Occupational Therapy

## 2019-11-14 ENCOUNTER — Ambulatory Visit: Payer: Medicaid Other | Admitting: Physical Therapy

## 2019-11-16 ENCOUNTER — Encounter: Payer: Medicaid Other | Admitting: Occupational Therapy

## 2019-11-21 ENCOUNTER — Encounter: Payer: Medicaid Other | Admitting: Occupational Therapy

## 2019-11-22 ENCOUNTER — Ambulatory Visit: Payer: Medicaid Other | Attending: Family Medicine | Admitting: Occupational Therapy

## 2019-11-23 ENCOUNTER — Encounter: Payer: Medicaid Other | Admitting: Occupational Therapy

## 2019-11-24 ENCOUNTER — Ambulatory Visit: Payer: Medicaid Other | Admitting: Occupational Therapy

## 2019-11-28 ENCOUNTER — Encounter: Payer: Medicaid Other | Admitting: Occupational Therapy

## 2019-12-06 ENCOUNTER — Encounter: Payer: Medicaid Other | Admitting: Occupational Therapy

## 2019-12-13 ENCOUNTER — Encounter: Payer: Medicaid Other | Admitting: Occupational Therapy

## 2019-12-20 ENCOUNTER — Encounter: Payer: Medicaid Other | Admitting: Occupational Therapy

## 2020-03-22 ENCOUNTER — Ambulatory Visit: Payer: Medicaid Other | Attending: Internal Medicine

## 2020-03-22 ENCOUNTER — Other Ambulatory Visit: Payer: Self-pay

## 2020-03-22 DIAGNOSIS — Z23 Encounter for immunization: Secondary | ICD-10-CM

## 2020-03-22 NOTE — Progress Notes (Signed)
   Covid-19 Vaccination Clinic  Name:  Ian [name]    MRN: [id number] DOB: Jun 07, 1961  03/22/2020  [name] was observed post Covid-19 immunization for 15 minutes without incident. He was provided with Vaccine Information Sheet and instruction to access the V-Safe system.   [name] was instructed to call 911 with any severe reactions post vaccine: Marland Kitchen Difficulty breathing  . Swelling of face and throat  . A fast heartbeat  . A bad rash all over body  . Dizziness and weakness   Immunizations Administered    Name Date Dose VIS Date Route   Pfizer COVID-19 Vaccine 03/22/2020  8:48 AM 0.3 mL 12/02/2019 Intramuscular   Manufacturer: [place]   Lot: [phone] [id number]   [place]: [id number]

## 2020-04-17 ENCOUNTER — Ambulatory Visit: Payer: Medicaid Other | Attending: Internal Medicine

## 2020-04-17 DIAGNOSIS — Z23 Encounter for immunization: Secondary | ICD-10-CM

## 2020-04-17 NOTE — Progress Notes (Signed)
   Covid-19 Vaccination Clinic  Name:  [name]    MRN: [id number] DOB: October 20, 1961  04/17/2020  [name] was observed post Covid-19 immunization for 15 minutes without incident. He was provided with Vaccine Information Sheet and instruction to access the V-Safe system.   [name] was instructed to call 911 with any severe reactions post vaccine: Marland Kitchen Difficulty breathing  . Swelling of face and throat  . A fast heartbeat  . A bad rash all over body  . Dizziness and weakness   Immunizations Administered    Name Date Dose VIS Date Route   Pfizer COVID-19 Vaccine 04/17/2020  9:31 AM 0.3 mL 02/15/2019 Intramuscular   Manufacturer: [place]   Lot: [id number]   [place]: [id number]

## 2020-05-08 DIAGNOSIS — Z8719 Personal history of other diseases of the digestive system: Principal | ICD-10-CM

## 2020-05-08 DIAGNOSIS — C801 Malignant (primary) neoplasm, unspecified: Principal | ICD-10-CM

## 2020-05-08 DIAGNOSIS — K219 Gastro-esophageal reflux disease without esophagitis: Principal | ICD-10-CM

## 2020-05-08 DIAGNOSIS — R197 Diarrhea, unspecified: Principal | ICD-10-CM

## 2020-06-20 ENCOUNTER — Ambulatory Visit: Admit: 2020-06-20 | Discharge: 2020-06-24 | Payer: MEDICAID

## 2020-06-21 IMAGING — MR MR HEAD W/O CM
10 series · 48 of 48 positions shown · non-contrast
Comparison: Prior head CT from earlier same day as well as previous
brain MRI from 05/05/2018.

CLINICAL DATA: Initial evaluation for left-sided numbness and
tingling with weakness for 3 days

EXAM:
MRI HEAD WITHOUT CONTRAST
MRI CERVICAL SPINE WITHOUT CONTRAST
TECHNIQUE: Multiplanar, multiecho pulse sequences of the brain and surrounding
structures, and cervical spine, to include the craniocervical
junction and cervicothoracic junction, were obtained without
intravenous contrast.

[Series 2: ax dwi_tracew · axial · 3.0mm · 1.31mm/px · z∈[-81,+74]mm · 8 of 48 slices shown]
[im 1/48]
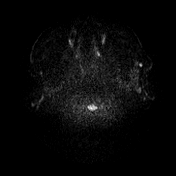
[im 7/48]
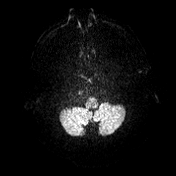
[im 14/48]
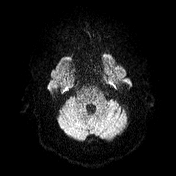
[im 21/48]
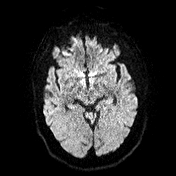
[im 27/48]
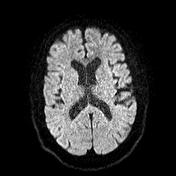
[im 34/48]
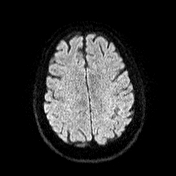
[im 41/48]
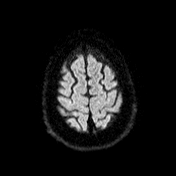
[im 48/48]
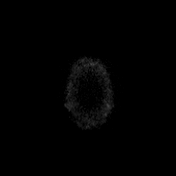

[Series 3: ax dwi_adc · axial · 3.0mm · 1.31mm/px · z∈[-81,+74]mm · 7 of 46 slices shown]
[im 1/46]
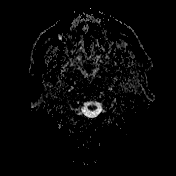
[im 8/46]
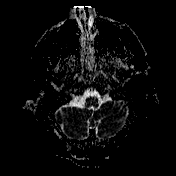
[im 16/46]
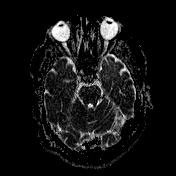
[im 23/46]
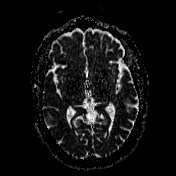
[im 31/46]
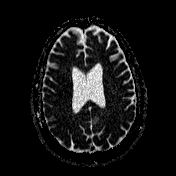
[im 38/46]
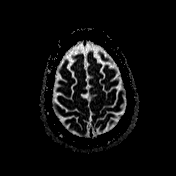
[im 46/46]
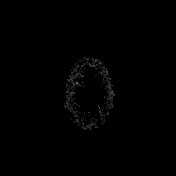

[Series 4: cor dwi_tracew · coronal · 5.0mm · 1.31mm/px · 5 of 38 slices shown]
[im 1/38]
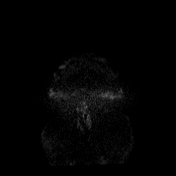
[im 10/38]
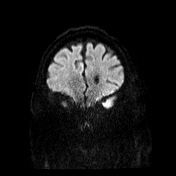
[im 19/38]
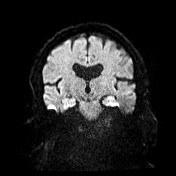
[im 28/38]
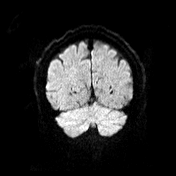
[im 38/38]
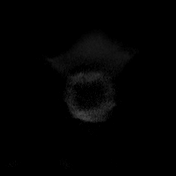

[Series 5: cor dwi_adc · coronal · 5.0mm · 1.31mm/px · 5 of 38 slices shown]
[im 1/38]
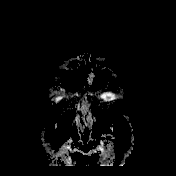
[im 10/38]
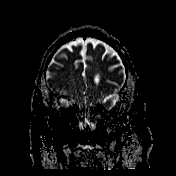
[im 19/38]
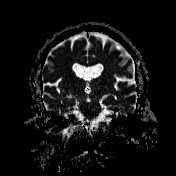
[im 28/38]
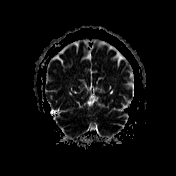
[im 38/38]
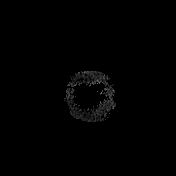

[Series 6: T1 · sagittal · 5.0mm · 0.94mm/px · 3 of 23 slices shown (1 of 2)]
[im 1/23]
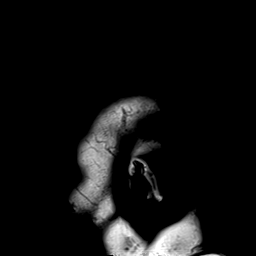
[im 12/23]
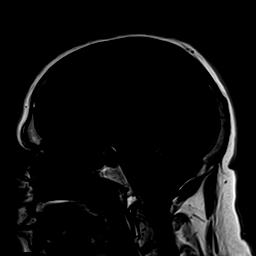
[im 23/23]
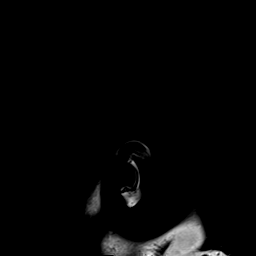

[Series 7: T2 · axial · 5.0mm · 0.45mm/px · z∈[-81,+75]mm · 4 of 27 slices shown (1 of 2)]
[im 1/27]
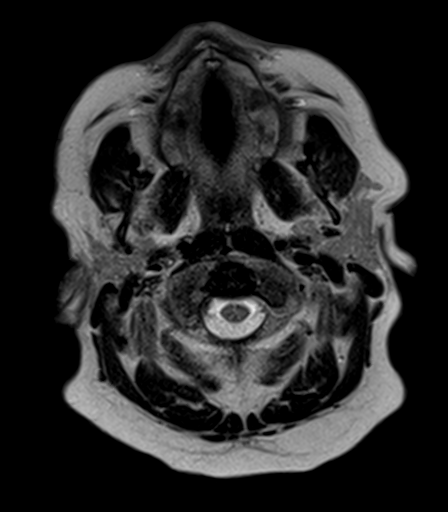
[im 9/27]
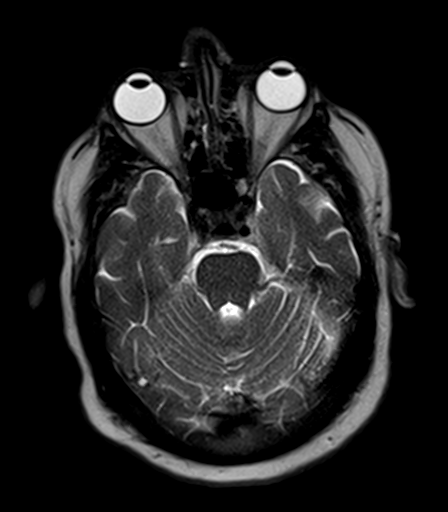
[im 18/27]
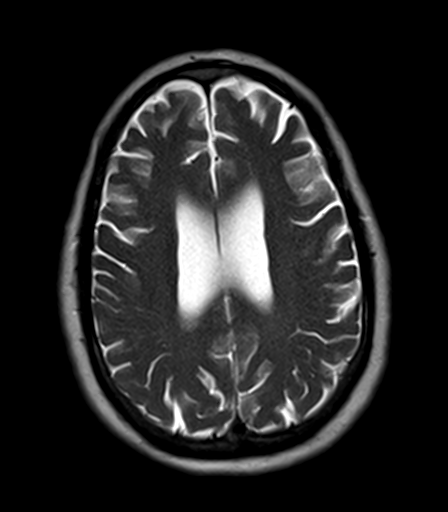
[im 27/27]
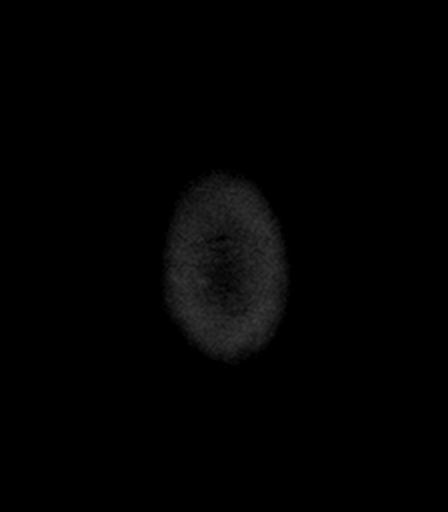

[Series 8: T2-star · axial · 5.0mm · 0.45mm/px · z∈[-81,+75]mm · 4 of 27 slices shown]
[im 1/27]
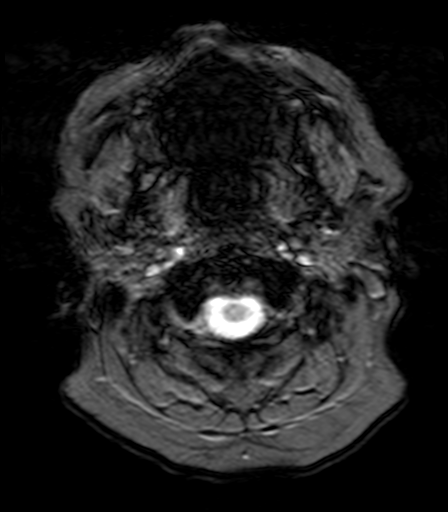
[im 9/27]
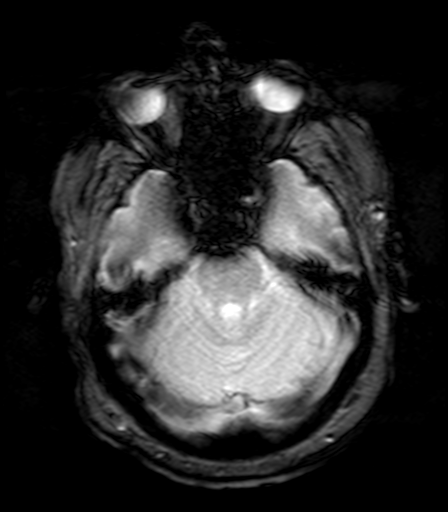
[im 18/27]
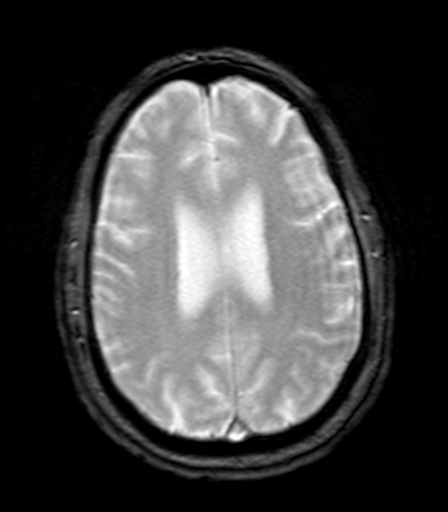
[im 27/27]
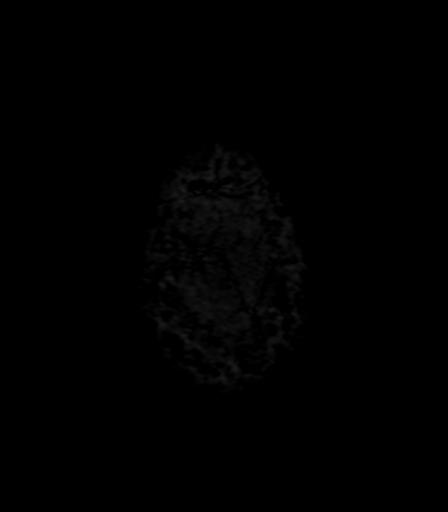

[Series 9: FLAIR · axial · 5.0mm · 1.20mm/px · z∈[-81,+75]mm · 4 of 27 slices shown]
[im 1/27]
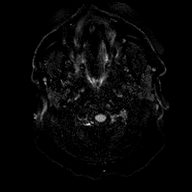
[im 9/27]
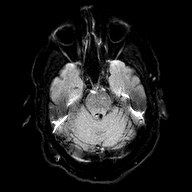
[im 18/27]
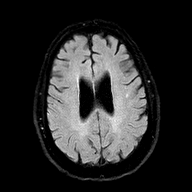
[im 27/27]
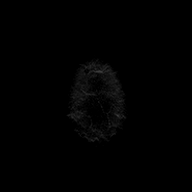

[Series 10: T1 · axial · 5.0mm · 0.90mm/px · z∈[-81,+75]mm · 4 of 27 slices shown (2 of 2)]
[im 1/27]
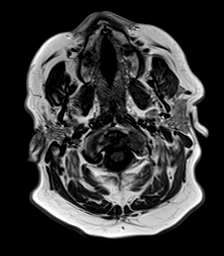
[im 9/27]
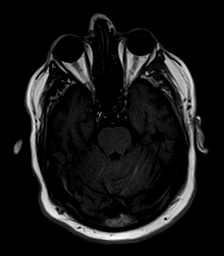
[im 18/27]
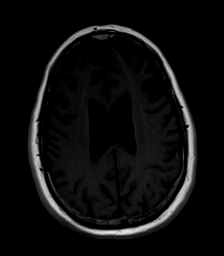
[im 27/27]
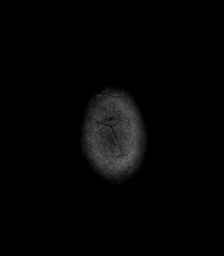

[Series 11: T2 · coronal · 5.0mm · 0.45mm/px · 4 of 31 slices shown (2 of 2)]
[im 1/31]
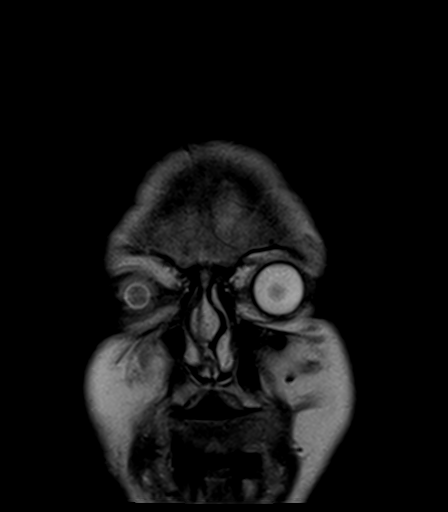
[im 11/31]
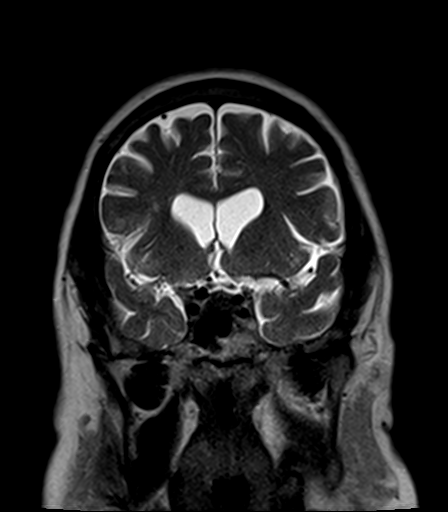
[im 21/31]
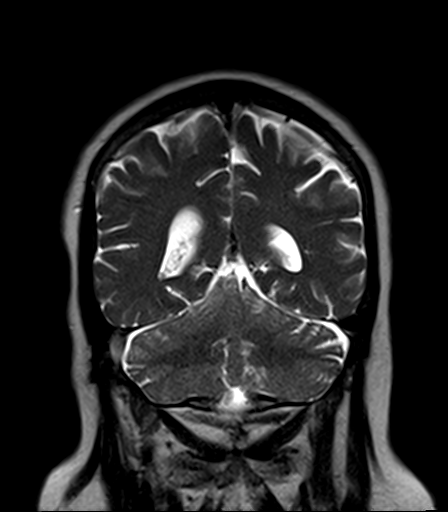
[im 31/31]
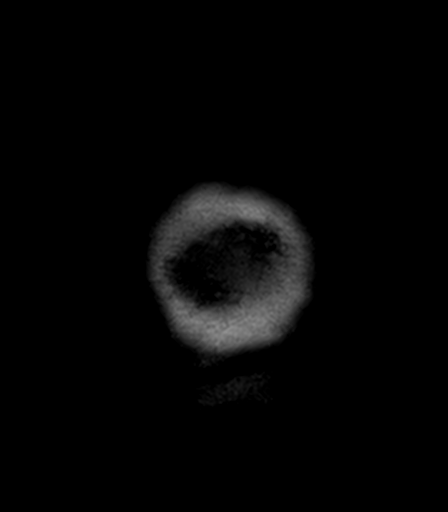

[48 of 48 positions shown; findings below may reference images not displayed]

FINDINGS: MRI HEAD FINDINGS

Brain: Mild age-related cerebral volume loss with chronic
microvascular ischemic disease.

No abnormal foci of restricted diffusion to suggest acute or
subacute ischemia. Gray-white matter differentiation well
maintained. No encephalomalacia to suggest chronic infarction. No
foci of susceptibility artifact to suggest acute or chronic
intracranial hemorrhage.

No mass lesion, midline shift or mass effect. No hydrocephalus. No
extra-axial fluid collection. Major dural sinuses are grossly
patent.

Pituitary gland and suprasellar region are normal. Midline
structures intact and normal.

Vascular: Major intracranial vascular flow voids well maintained and
normal in appearance.

Skull and upper cervical spine: Craniocervical junction normal.
Visualized upper cervical spine within normal limits. Bone marrow
signal intensity normal. No scalp soft tissue abnormality.

Sinuses/Orbits: Globes and orbital soft tissues within normal
limits.

Paranasal sinuses are clear. No mastoid effusion. Inner ear
structures normal.

Other: None.

MRI CERVICAL SPINE FINDINGS

Alignment: Straightening of the normal cervical lordosis. No
listhesis or subluxation.

Vertebrae: Vertebral body height maintained without evidence for
acute or chronic fracture. Bone marrow signal intensity within
normal limits. No discrete or worrisome osseous lesions. Mild
reactive endplate changes about the C4-5 interspace. No other
abnormal marrow edema.

Cord: Signal intensity within the cervical spinal cord is normal.
Normal cord caliber and morphology.

Posterior Fossa, vertebral arteries, paraspinal tissues:
Craniocervical junction within normal limits. Paraspinous and
prevertebral soft tissues are normal. Normal intravascular flow
voids seen within the vertebral arteries bilaterally.

Disc levels:

C2-C3: Shallow posterior disc bulging. No canal or foraminal
stenosis.

C3-C4: Shallow broad base central disc protrusion mildly indents the
ventral thecal sac, contacting the ventral spinal cord. No cord
deformity or significant spinal stenosis. Foramina remain patent.

C4-C5: Small central disc protrusion mildly indents the ventral
thecal sac, contacting the ventral spinal cord. No cord deformity or
significant spinal stenosis. Foramina remain widely patent.

C5-C6: Mild disc bulge with uncovertebral hypertrophy. No
significant canal or foraminal stenosis.

C6-C7: Mild annular disc bulge with uncovertebral hypertrophy. No
significant canal or foraminal stenosis.

C7-T1:  Unremarkable.

Visualized upper thoracic spine demonstrates no significant finding.
IMPRESSION: 1. Mild age-related cerebral atrophy with chronic microvascular
ischemic disease. Otherwise unremarkable brain MRI with no acute
intracranial abnormality identified.
2. Mild noncompressive disc bulging at C3-4 through C6-7 without
significant stenosis or cord impingement. No significant foraminal
encroachment within the cervical spine. No findings to explain
patient's symptoms identified.

## 2020-06-21 IMAGING — MR MR CERVICAL SPINE W/O CM
5 series · 35 of 48 positions shown · non-contrast
Comparison: Prior head CT from earlier same day as well as previous
brain MRI from 05/05/2018.

CLINICAL DATA: Initial evaluation for left-sided numbness and
tingling with weakness for 3 days

EXAM:
MRI HEAD WITHOUT CONTRAST
MRI CERVICAL SPINE WITHOUT CONTRAST
TECHNIQUE: Multiplanar, multiecho pulse sequences of the brain and surrounding
structures, and cervical spine, to include the craniocervical
junction and cervicothoracic junction, were obtained without
intravenous contrast.

[Series 5: T2 · sagittal · 3.0mm · 0.62mm/px · 7 of 15 slices shown (1 of 2)]
[im 1/15]
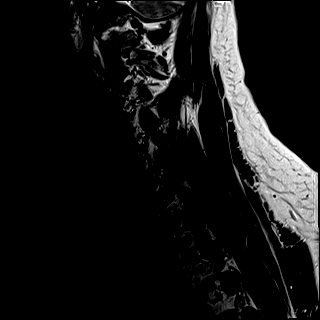
[im 3/15]
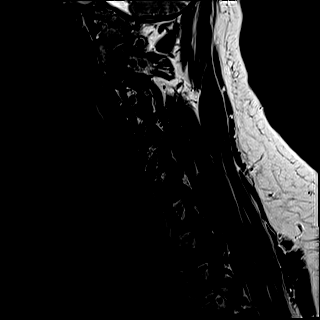
[im 5/15]
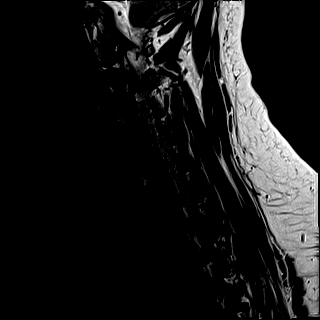
[im 8/15]
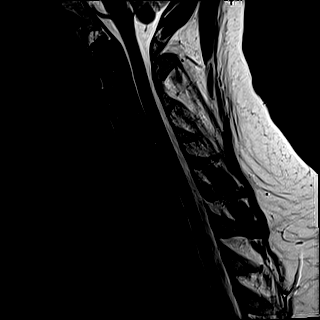
[im 10/15]
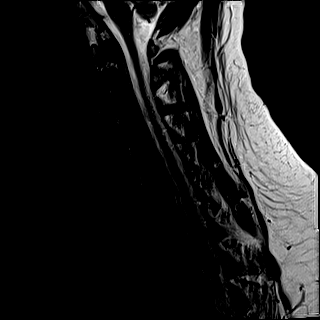
[im 12/15]
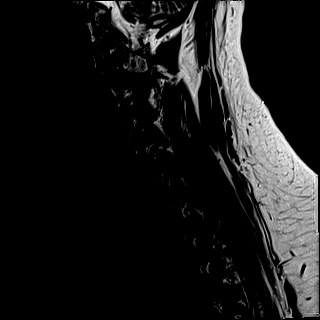
[im 15/15]
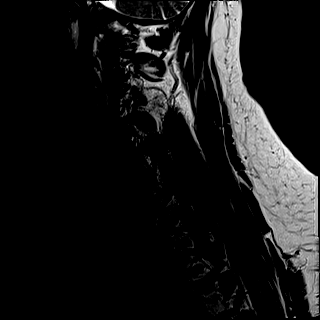

[Series 6: FLAIR · sagittal · 3.0mm · 0.78mm/px · 7 of 15 slices shown]
[im 1/15]
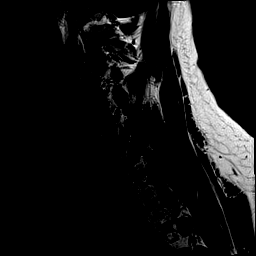
[im 3/15]
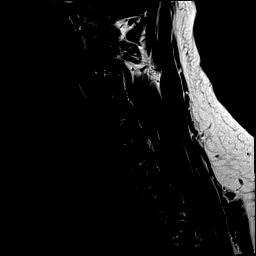
[im 5/15]
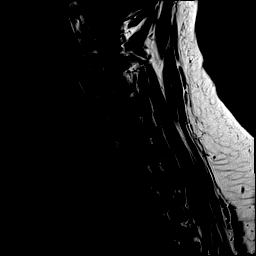
[im 8/15]
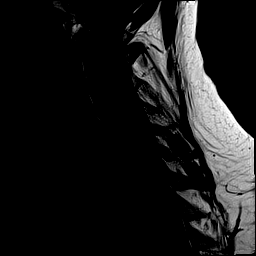
[im 10/15]
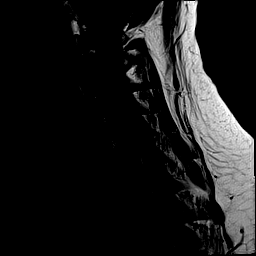
[im 12/15]
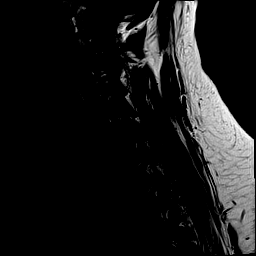
[im 15/15]
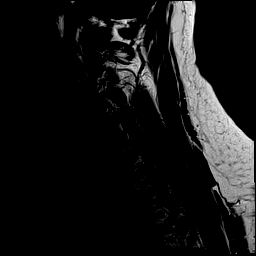

[Series 7: STIR · sagittal · 3.0mm · 0.62mm/px · 6 of 15 slices shown]
[im 1/15]
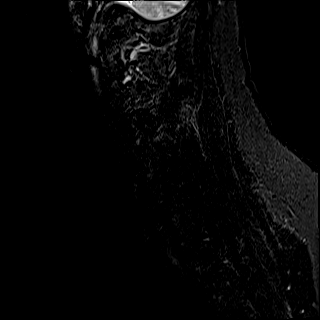
[im 3/15]
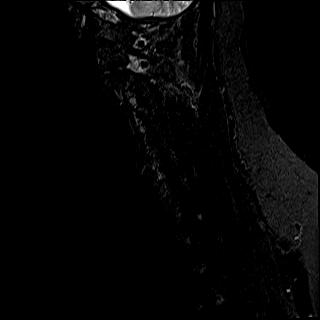
[im 6/15]
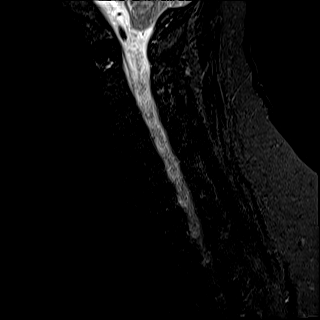
[im 9/15]
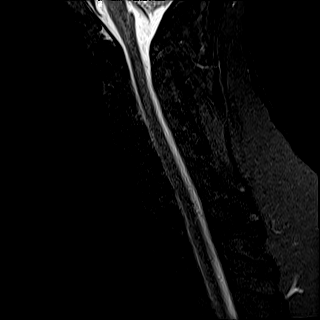
[im 12/15]
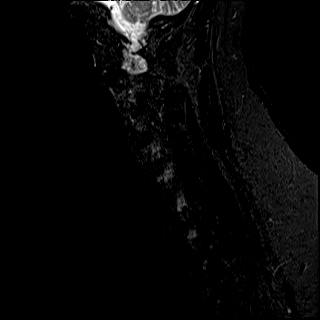
[im 15/15]
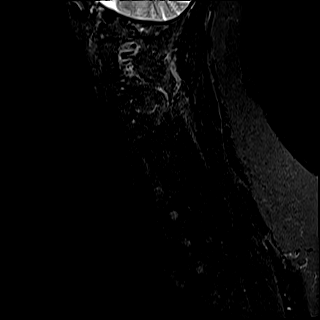

[Series 8: T2 · axial · 3.0mm · 0.70mm/px · z∈[-121,-9]mm · 8 of 34 slices shown (2 of 2)]
[im 1/34]
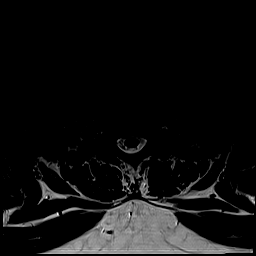
[im 6/34]
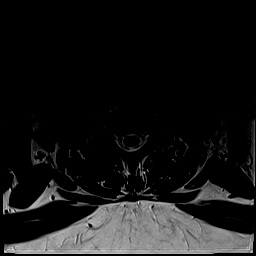
[im 11/34]
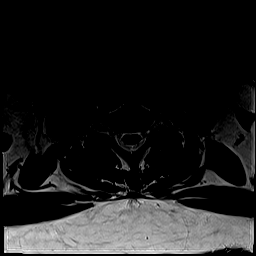
[im 16/34]
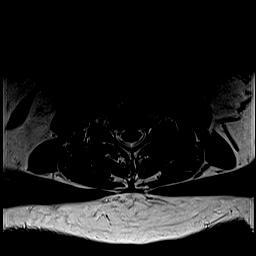
[im 18/34]
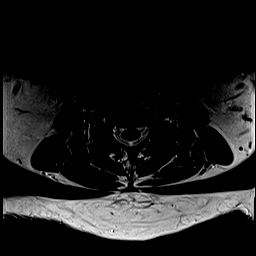
[im 23/34]
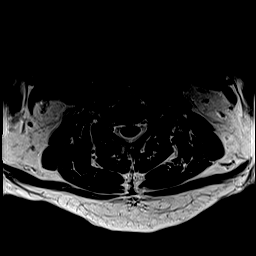
[im 28/34]
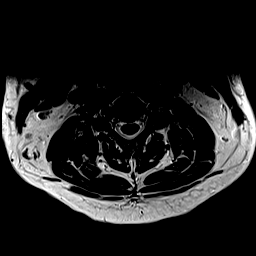
[im 34/34]
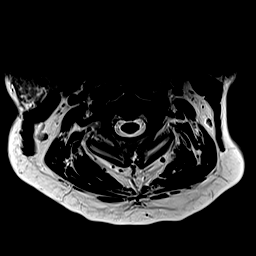

[Series 9: ax mpgr · axial · 3.0mm · 0.35mm/px · z∈[-121,-30]mm · 7 of 34 slices shown]
[im 1/34]
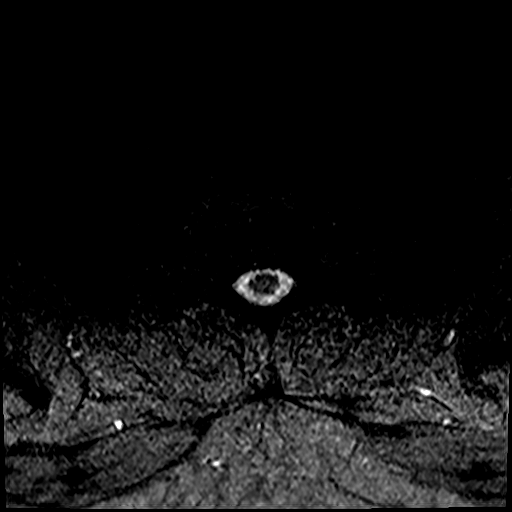
[im 6/34]
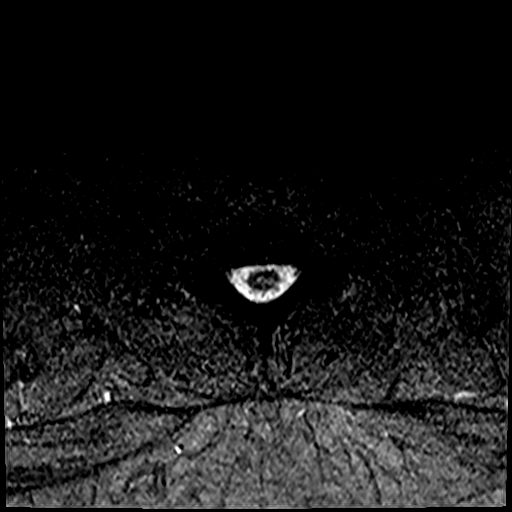
[im 11/34]
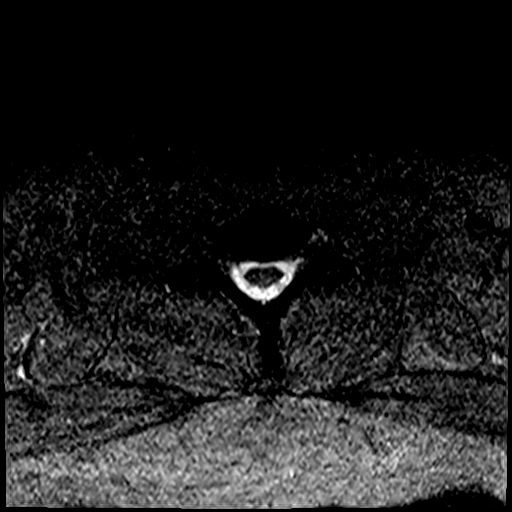
[im 16/34]
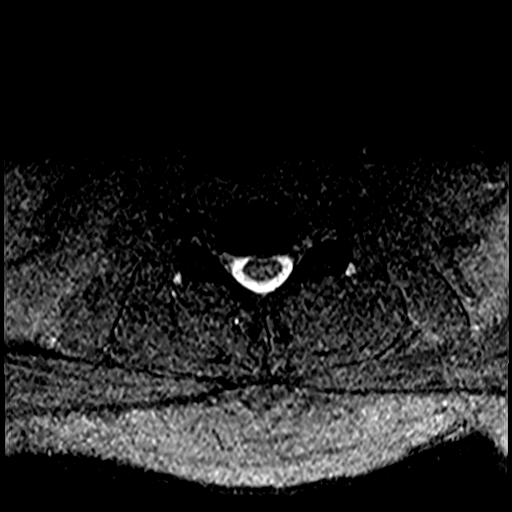
[im 18/34]
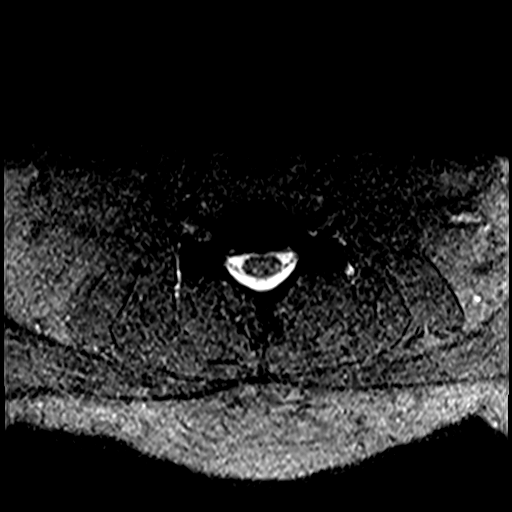
[im 23/34]
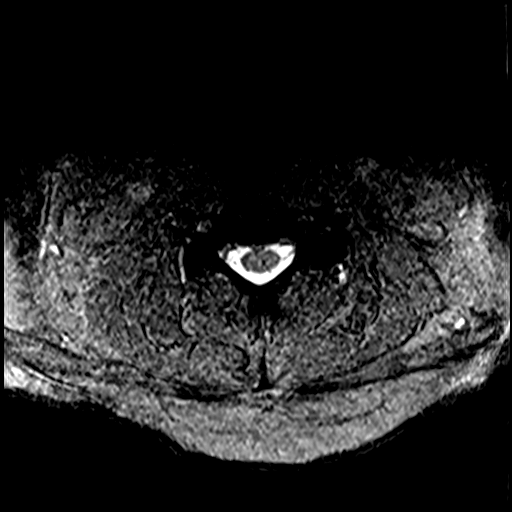
[im 28/34]
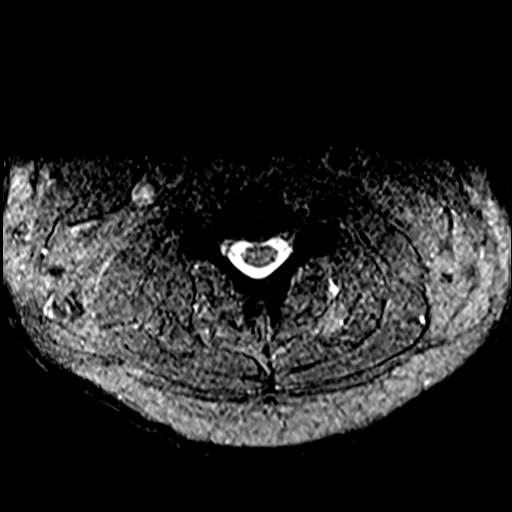

[35 of 48 positions shown; findings below may reference images not displayed]

FINDINGS: MRI HEAD FINDINGS

Brain: Mild age-related cerebral volume loss with chronic
microvascular ischemic disease.

No abnormal foci of restricted diffusion to suggest acute or
subacute ischemia. Gray-white matter differentiation well
maintained. No encephalomalacia to suggest chronic infarction. No
foci of susceptibility artifact to suggest acute or chronic
intracranial hemorrhage.

No mass lesion, midline shift or mass effect. No hydrocephalus. No
extra-axial fluid collection. Major dural sinuses are grossly
patent.

Pituitary gland and suprasellar region are normal. Midline
structures intact and normal.

Vascular: Major intracranial vascular flow voids well maintained and
normal in appearance.

Skull and upper cervical spine: Craniocervical junction normal.
Visualized upper cervical spine within normal limits. Bone marrow
signal intensity normal. No scalp soft tissue abnormality.

Sinuses/Orbits: Globes and orbital soft tissues within normal
limits.

Paranasal sinuses are clear. No mastoid effusion. Inner ear
structures normal.

Other: None.

MRI CERVICAL SPINE FINDINGS

Alignment: Straightening of the normal cervical lordosis. No
listhesis or subluxation.

Vertebrae: Vertebral body height maintained without evidence for
acute or chronic fracture. Bone marrow signal intensity within
normal limits. No discrete or worrisome osseous lesions. Mild
reactive endplate changes about the C4-5 interspace. No other
abnormal marrow edema.

Cord: Signal intensity within the cervical spinal cord is normal.
Normal cord caliber and morphology.

Posterior Fossa, vertebral arteries, paraspinal tissues:
Craniocervical junction within normal limits. Paraspinous and
prevertebral soft tissues are normal. Normal intravascular flow
voids seen within the vertebral arteries bilaterally.

Disc levels:

C2-C3: Shallow posterior disc bulging. No canal or foraminal
stenosis.

C3-C4: Shallow broad base central disc protrusion mildly indents the
ventral thecal sac, contacting the ventral spinal cord. No cord
deformity or significant spinal stenosis. Foramina remain patent.

C4-C5: Small central disc protrusion mildly indents the ventral
thecal sac, contacting the ventral spinal cord. No cord deformity or
significant spinal stenosis. Foramina remain widely patent.

C5-C6: Mild disc bulge with uncovertebral hypertrophy. No
significant canal or foraminal stenosis.

C6-C7: Mild annular disc bulge with uncovertebral hypertrophy. No
significant canal or foraminal stenosis.

C7-T1:  Unremarkable.

Visualized upper thoracic spine demonstrates no significant finding.
IMPRESSION: 1. Mild age-related cerebral atrophy with chronic microvascular
ischemic disease. Otherwise unremarkable brain MRI with no acute
intracranial abnormality identified.
2. Mild noncompressive disc bulging at C3-4 through C6-7 without
significant stenosis or cord impingement. No significant foraminal
encroachment within the cervical spine. No findings to explain
patient's symptoms identified.

## 2020-06-21 IMAGING — CT CT HEAD W/O CM
3 series · 16 of 47 positions shown, 19 images · non-contrast
Comparison: July 12, 2017

CLINICAL DATA: Left-sided tingling for 2 weeks.

EXAM:
CT HEAD WITHOUT CONTRAST
TECHNIQUE: Contiguous axial images were obtained from the base of the skull
through the vertex without intravenous contrast.

[Series 2: head wo · axial · 0.47mm/px · z∈[-139,-4]mm · 10 of 33 slices shown, 13 images]
[im 3/33  brain]
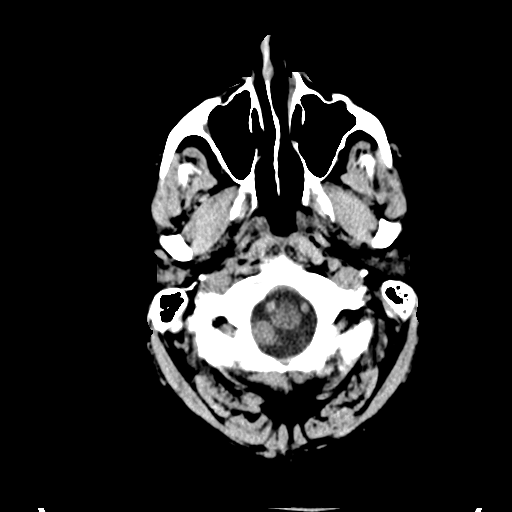
[im 3/33  bone]
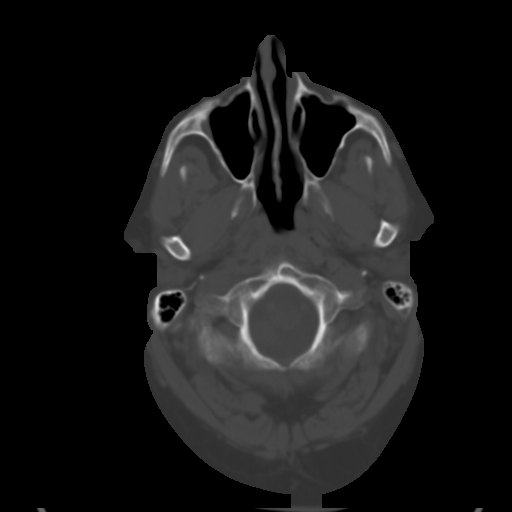
[im 6/33  brain]
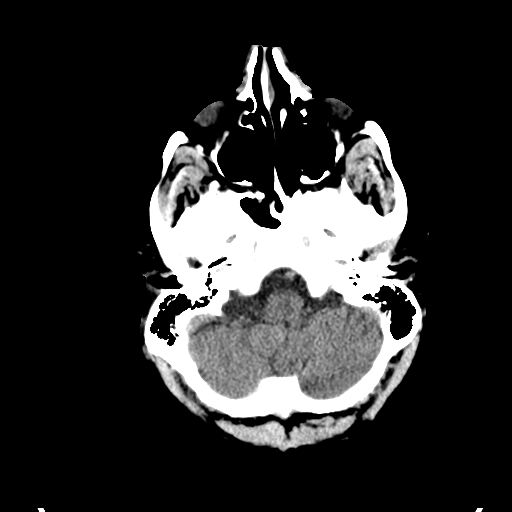
[im 9/33  brain]
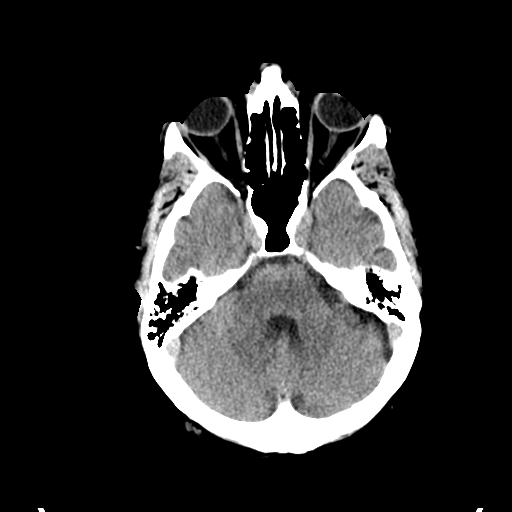
[im 12/33  brain]
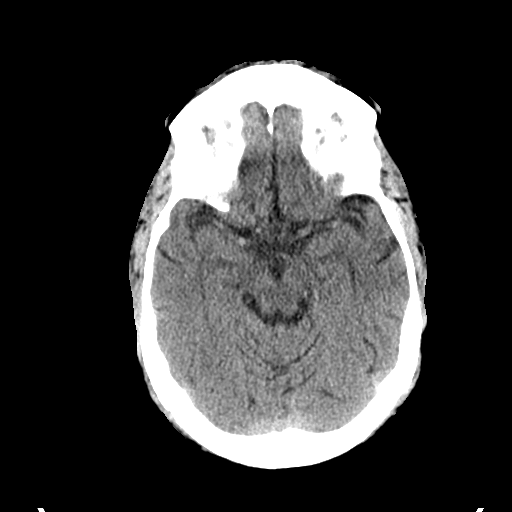
[im 15/33  brain]
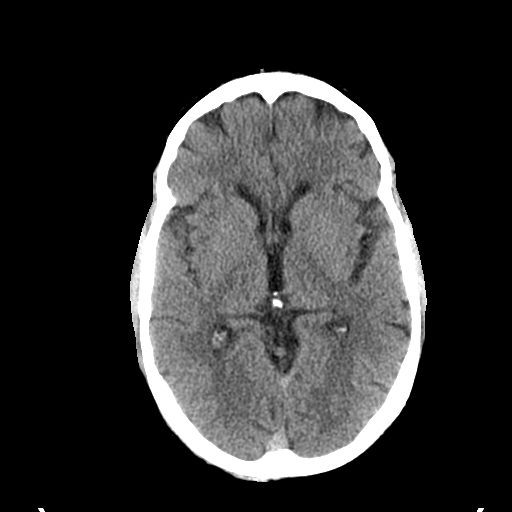
[im 15/33  bone]
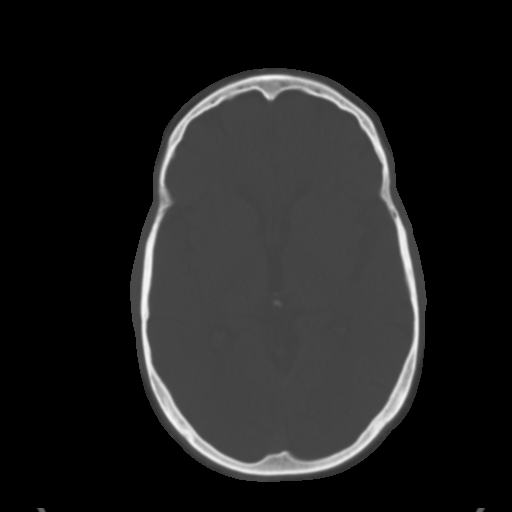
[im 18/33  brain]
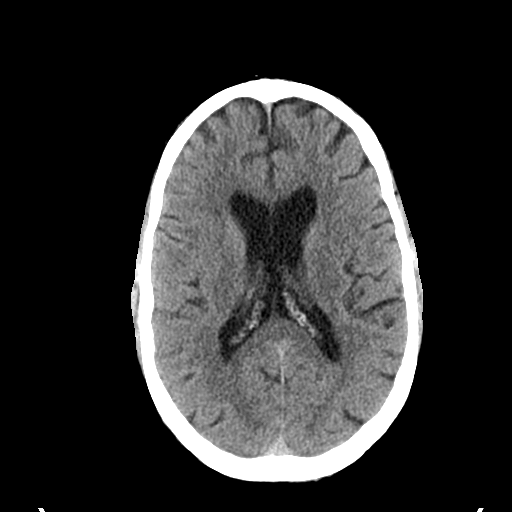
[im 21/33  brain]
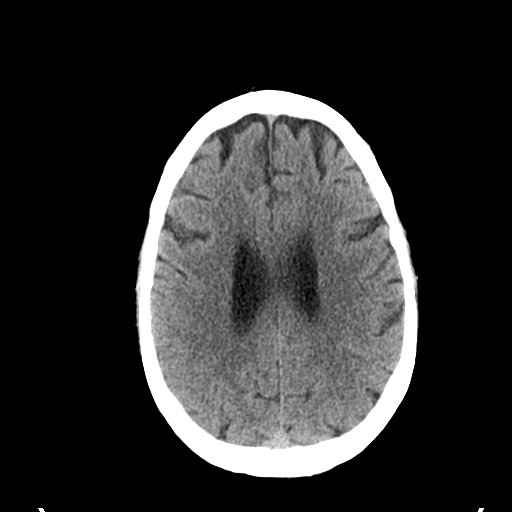
[im 25/33  brain]
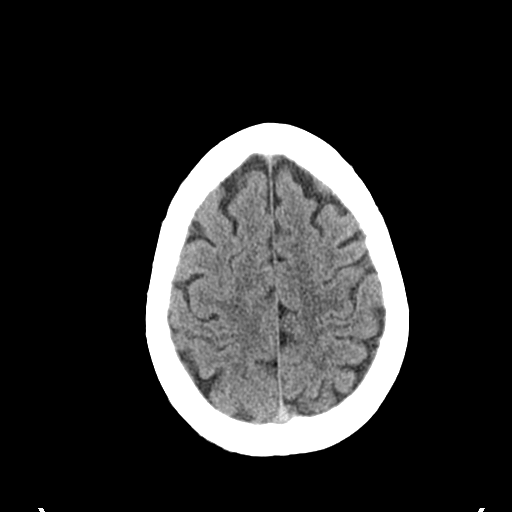
[im 27/33  brain]
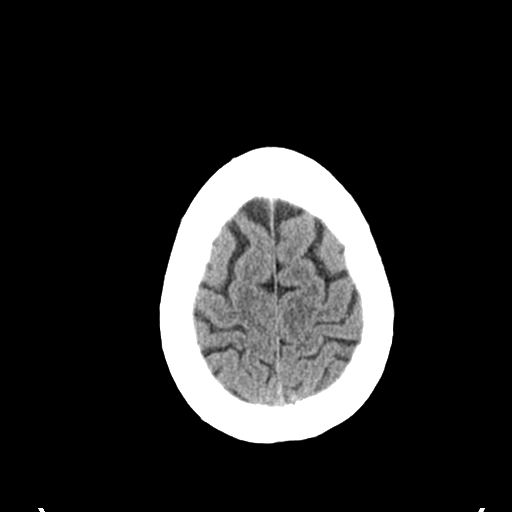
[im 27/33  bone]
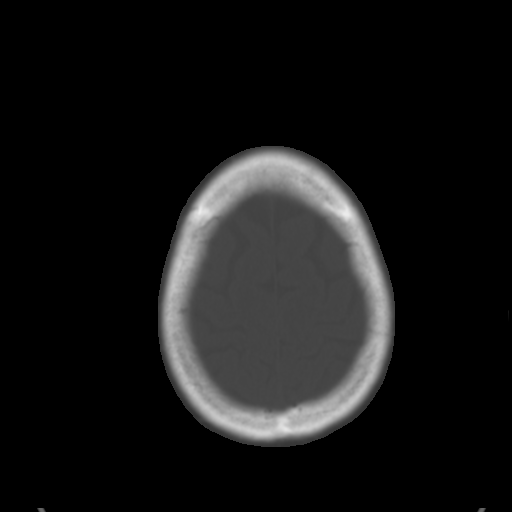
[im 30/33  brain]
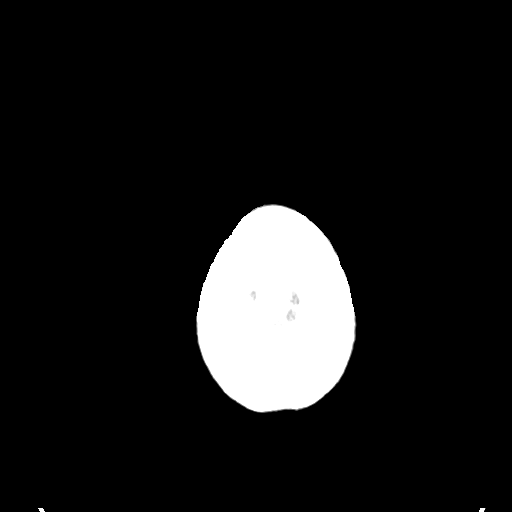

[Series 4: coronal soft tissue · coronal · 0.31mm/px · 3 of 69 slices shown]
[im 24/69  brain]
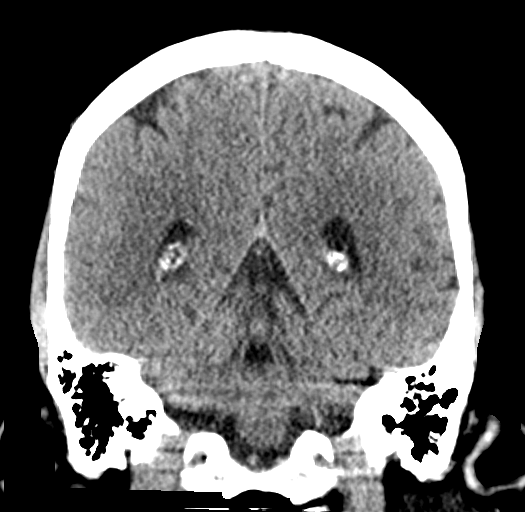
[im 31/69  brain]
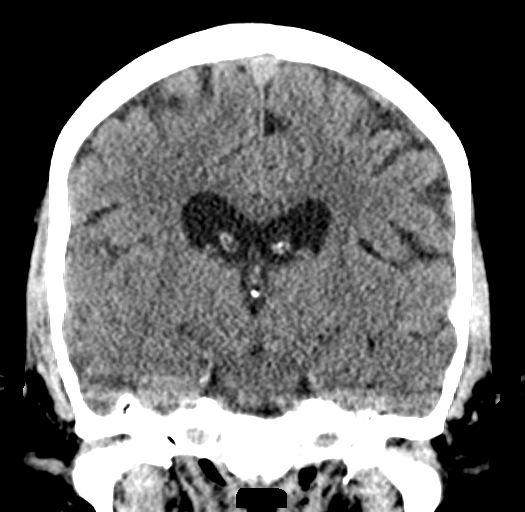
[im 38/69  brain]
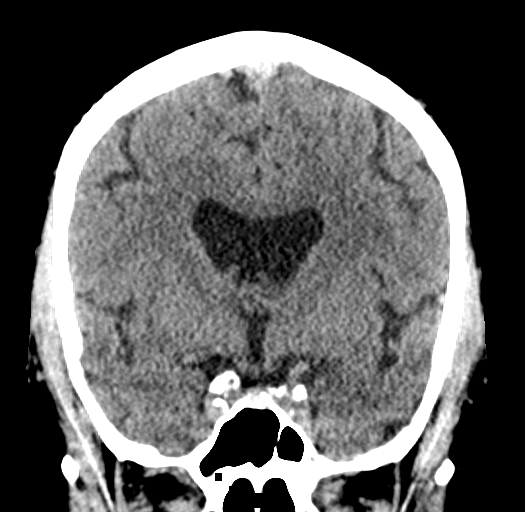

[Series 5: sagittal soft tissue · sagittal · 0.31mm/px · 3 of 55 slices shown]
[im 19/55  brain]
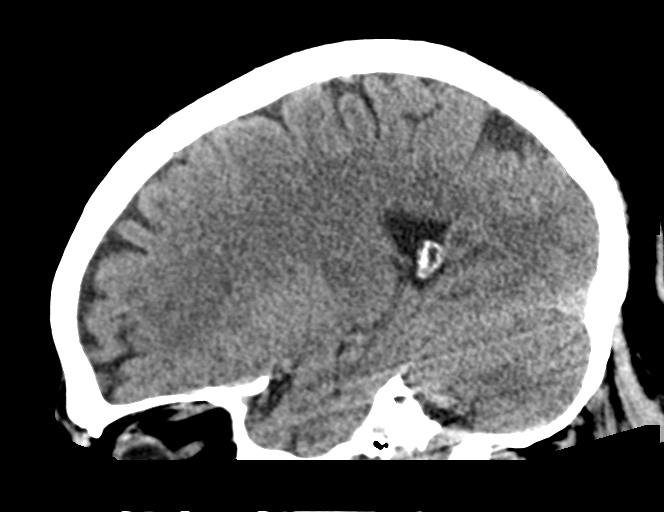
[im 28/55  brain]
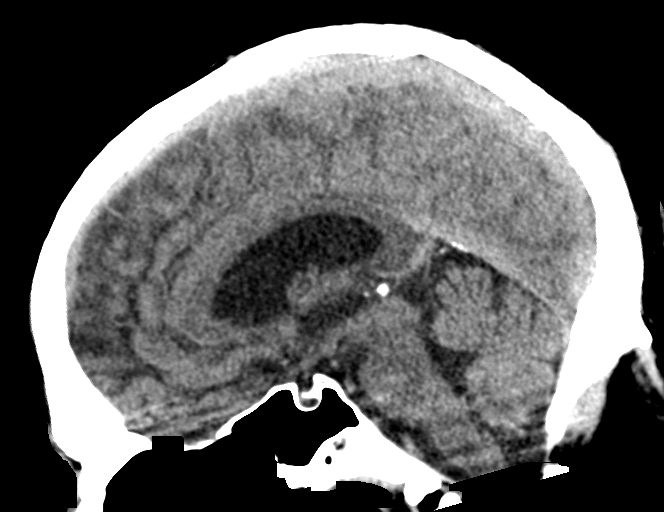
[im 37/55  brain]
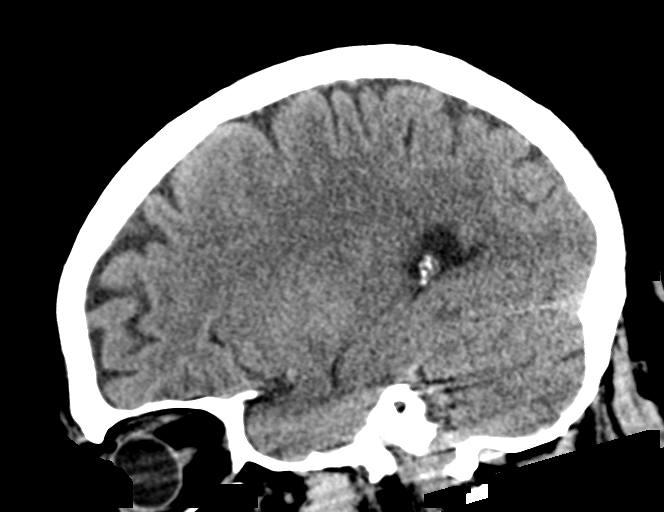

[16 of 47 positions shown; findings below may reference images not displayed]

FINDINGS: Brain: No evidence of acute infarction, hemorrhage, hydrocephalus,
extra-axial collection or mass lesion/mass effect. Mild brain
parenchymal volume loss.

Vascular: No hyperdense vessel or unexpected calcification.

Skull: Normal. Negative for fracture or focal lesion.

Sinuses/Orbits: No acute finding.

Other: None.
IMPRESSION: 1. No acute intracranial abnormality.
2. Mild brain parenchymal atrophy.

## 2020-08-29 ENCOUNTER — Encounter
Admit: 2020-08-29 | Discharge: 2020-08-29 | Payer: MEDICAID | Attending: Certified Registered" | Primary: Certified Registered"

## 2020-08-29 ENCOUNTER — Ambulatory Visit: Admit: 2020-08-29 | Discharge: 2020-08-29 | Payer: MEDICAID

## 2020-09-05 ENCOUNTER — Emergency Department
Admission: EM | Admit: 2020-09-05 | Discharge: 2020-09-06 | Disposition: A | Discharge status: Other Acute Inpatient Hospital | Payer: Medicaid Other | Attending: Emergency Medicine | Admitting: Emergency Medicine

## 2020-09-05 ENCOUNTER — Encounter: Payer: Self-pay | Admitting: Emergency Medicine

## 2020-09-05 ENCOUNTER — Other Ambulatory Visit: Payer: Self-pay

## 2020-09-05 ENCOUNTER — Emergency Department: Payer: Medicaid Other

## 2020-09-05 DIAGNOSIS — F1410 Cocaine abuse, uncomplicated: Secondary | ICD-10-CM | POA: Insufficient documentation

## 2020-09-05 DIAGNOSIS — J441 Chronic obstructive pulmonary disease with (acute) exacerbation: Secondary | ICD-10-CM | POA: Diagnosis not present

## 2020-09-05 DIAGNOSIS — R000 Tachycardia, unspecified: Secondary | ICD-10-CM | POA: Insufficient documentation

## 2020-09-05 DIAGNOSIS — F1020 Alcohol dependence, uncomplicated: Secondary | ICD-10-CM | POA: Diagnosis not present

## 2020-09-05 DIAGNOSIS — F17210 Nicotine dependence, cigarettes, uncomplicated: Secondary | ICD-10-CM | POA: Diagnosis not present

## 2020-09-05 DIAGNOSIS — Z79899 Other long term (current) drug therapy: Secondary | ICD-10-CM | POA: Insufficient documentation

## 2020-09-05 DIAGNOSIS — Z20822 Contact with and (suspected) exposure to covid-19: Secondary | ICD-10-CM | POA: Diagnosis not present

## 2020-09-05 DIAGNOSIS — I10 Essential (primary) hypertension: Secondary | ICD-10-CM | POA: Diagnosis not present

## 2020-09-05 DIAGNOSIS — R0682 Tachypnea, not elsewhere classified: Secondary | ICD-10-CM | POA: Insufficient documentation

## 2020-09-05 DIAGNOSIS — R45851 Suicidal ideations: Secondary | ICD-10-CM | POA: Insufficient documentation

## 2020-09-05 DIAGNOSIS — Z7982 Long term (current) use of aspirin: Secondary | ICD-10-CM | POA: Insufficient documentation

## 2020-09-05 DIAGNOSIS — I2510 Atherosclerotic heart disease of native coronary artery without angina pectoris: Secondary | ICD-10-CM | POA: Diagnosis not present

## 2020-09-05 DIAGNOSIS — R062 Wheezing: Secondary | ICD-10-CM | POA: Insufficient documentation

## 2020-09-05 DIAGNOSIS — F332 Major depressive disorder, recurrent severe without psychotic features: Secondary | ICD-10-CM | POA: Diagnosis present

## 2020-09-05 LAB — COMPREHENSIVE METABOLIC PANEL
ALT: 24 U/L (ref 0–44)
AST: 19 U/L (ref 15–41)
Albumin: 3.9 g/dL (ref 3.5–5.0)
Alkaline Phosphatase: 66 U/L (ref 38–126)
Anion gap: 14 (ref 5–15)
BUN: 10 mg/dL (ref 6–20)
CO2: 25 mmol/L (ref 22–32)
Calcium: 9.1 mg/dL (ref 8.9–10.3)
Chloride: 101 mmol/L (ref 98–111)
Creatinine, Ser: 0.85 mg/dL (ref 0.61–1.24)
GFR calc Af Amer: >60 mL/min (ref >60)
GFR calc non Af Amer: >60 mL/min (ref >60)
Glucose, Bld: 195 mg/dL — ABNORMAL HIGH (ref 70–99)
Potassium: 3.8 mmol/L (ref 3.5–5.1)
Sodium: 140 mmol/L (ref 135–145)
Total Bilirubin: 0.4 mg/dL (ref 0.3–1.2)
Total Protein: 7.3 g/dL (ref 6.5–8.1)

## 2020-09-05 LAB — ACETAMINOPHEN LEVEL: Acetaminophen (Tylenol), Serum: <10 ug/mL — ABNORMAL LOW (ref 10–30)

## 2020-09-05 LAB — CBC
HCT: 44.3 % (ref 39.0–52.0)
Hemoglobin: 14.5 g/dL (ref 13.0–17.0)
MCH: 29.6 pg (ref 26.0–34.0)
MCHC: 32.7 g/dL (ref 30.0–36.0)
MCV: 90.4 fL (ref 80.0–100.0)
Platelets: 240 10*3/uL (ref 150–400)
RBC: 4.9 MIL/uL (ref 4.22–5.81)
RDW: 12.9 % (ref 11.5–15.5)
WBC: 16.3 10*3/uL — ABNORMAL HIGH (ref 4.0–10.5)
nRBC: 0 % (ref 0.0–0.2)

## 2020-09-05 LAB — ETHANOL: Alcohol, Ethyl (B): 137 mg/dL — ABNORMAL HIGH (ref <10)

## 2020-09-05 LAB — SALICYLATE LEVEL: Salicylate Lvl: <7 mg/dL — ABNORMAL LOW (ref 7.0–30.0)

## 2020-09-05 LAB — PROCALCITONIN: Procalcitonin: <0.1 ng/mL

## 2020-09-05 LAB — BRAIN NATRIURETIC PEPTIDE: B Natriuretic Peptide: 25.6 pg/mL (ref 0.0–100.0)

## 2020-09-05 MED ORDER — LORAZEPAM 2 MG PO TABS: 0.0000 mg | ORAL_TABLET | Freq: Four times a day (QID) | ORAL | Status: DC

## 2020-09-05 MED ORDER — LISINOPRIL 10 MG PO TABS
40.0000 mg | ORAL_TABLET | Freq: Every day | ORAL | Status: DC
  Administered 2020-09-06: 40 mg via ORAL
  Filled 2020-09-05: qty 4

## 2020-09-05 MED ORDER — LORAZEPAM 2 MG PO TABS: 0.0000 mg | ORAL_TABLET | Freq: Two times a day (BID) | ORAL | Status: DC

## 2020-09-05 MED ORDER — MOMETASONE FURO-FORMOTEROL FUM 200-5 MCG/ACT IN AERO
2.0000 | INHALATION_SPRAY | Freq: Two times a day (BID) | RESPIRATORY_TRACT | Status: DC
  Administered 2020-09-05 – 2020-09-06 (×2): 2 via RESPIRATORY_TRACT
  Filled 2020-09-05: qty 8.8

## 2020-09-05 MED ORDER — LORAZEPAM 2 MG/ML IJ SOLN
0.0000 mg | Freq: Four times a day (QID) | INTRAMUSCULAR | Status: DC
  Administered 2020-09-05: 2 mg via INTRAVENOUS
  Filled 2020-09-05: qty 1

## 2020-09-05 MED ORDER — IPRATROPIUM-ALBUTEROL 0.5-2.5 (3) MG/3ML IN SOLN
3.0000 mL | Freq: Once | RESPIRATORY_TRACT | Status: AC
  Administered 2020-09-05: 3 mL via RESPIRATORY_TRACT
  Filled 2020-09-05: qty 3

## 2020-09-05 MED ORDER — IPRATROPIUM-ALBUTEROL 0.5-2.5 (3) MG/3ML IN SOLN
3.0000 mL | RESPIRATORY_TRACT | Status: DC | PRN
  Administered 2020-09-06: 3 mL via RESPIRATORY_TRACT
  Filled 2020-09-05: qty 3

## 2020-09-05 MED ORDER — LORAZEPAM 2 MG/ML IJ SOLN: 0.0000 mg | Freq: Two times a day (BID) | INTRAMUSCULAR | Status: DC

## 2020-09-05 MED ORDER — THIAMINE HCL 100 MG PO TABS
100.0000 mg | ORAL_TABLET | Freq: Every day | ORAL | Status: DC
  Administered 2020-09-06: 100 mg via ORAL
  Filled 2020-09-05: qty 1

## 2020-09-05 MED ORDER — THIAMINE HCL 100 MG/ML IJ SOLN: 100.0000 mg | Freq: Every day | INTRAMUSCULAR | Status: DC

## 2020-09-05 MED ORDER — METHYLPREDNISOLONE SODIUM SUCC 125 MG IJ SOLR
125.0000 mg | Freq: Once | INTRAMUSCULAR | Status: AC
  Administered 2020-09-05: 125 mg via INTRAVENOUS
  Filled 2020-09-05: qty 2

## 2020-09-05 NOTE — ED Notes (Signed)
Pt. Transferred from Triage to room 22 after dressing out and screening for contraband. Report to include Situation, Background, Assessment and Recommendations from RN. Pt. Oriented to Quad including Q15 minute rounds as well as Engineer, drilling for their protection. Patient is alert and oriented, warm and dry in no acute distress. Patient reported SI without a plan. Denied HI, and AVH. Pt. Encouraged to let me know if needs arise.

## 2020-09-05 NOTE — ED Triage Notes (Addendum)
Pt in via [place] [employer] voluntarily.  Pt states, "Im just not a happy camper right now, its been coming on."  Reports suicidal ideation, denies any plan.  Tearful in triage.  Reports being off of psych meds for a few weeks.    Labored breathing noted in triage, states this is his baseline.  Hx COPD.

## 2020-09-05 NOTE — ED Notes (Addendum)
Pt dressed into hospital provided attire via this RN and EDT, Gilmore [place].  Belongings placed into labeled bag and handed off to [employer], [employer].  Cell phone  Lighter Tshirt (2) Gym shorts (2) [employer]

## 2020-09-05 NOTE — ED Provider Notes (Addendum)
[facility] Emergency Department Provider Note  ____________________________________________   First MD Initiated Contact with Patient 09/05/20 2237     (approximate)  I have reviewed the triage vital signs and the nursing notes.   HISTORY  Chief Complaint Psychiatric Evaluation   HPI Ian [name] is a 59 y.o. male with a past medical history of CAD, HTN, CVA, HDL, Barrett's esophagus, anxiety, and depression who presents for assessment of worsening depression.  Patient is very reticent on initial interview and denies any specific plan to kill himself but states he knew he would if he had come to the hospital.  He denies any HI or hallucinations.  He does endorse using some cocaine earlier today and drinking to 40 ounces of liquor.  He refuses to state if he is a daily drinker.  He does endorse some cough and shortness of breath although it is unclear how long this has been going on as patient refuses to elaborate.  He denies any chest pain, dental pain, nausea, vomiting, diarrhea, dysuria, fevers, chills, headache, or other acute symptoms.  He does endorse chronic back pain.  No clear alleviating aggravating factors.         Past Medical History:  Diagnosis Date  . Anxiety   . Barrett esophagus   . Cancer ([place])   . Coronary artery disease   . Depression   . GERD (gastroesophageal reflux disease)   . Hypertension   . Pre-diabetes   . Stroke [facility]) 2014   "mini-stroke" per patient    Patient Active Problem List   Diagnosis Date Noted  . CVA (cerebral vascular accident) ([place]) 08/25/2019  . Major depression, recurrent, chronic ([place]) 12/13/2018  . Hepatitis C 12/13/2018  . Leukocytosis 05/04/2018  . Severe recurrent major depression without psychotic features ([place]) 11/05/2017  . Dyslipidemia 01/07/2017  . Barrett's esophagus 01/07/2017  . Bipolar I disorder, most recent episode depressed with anxious distress ([place]) 01/06/2017  . Cannabis use  disorder, moderate, dependence ([place]) 01/06/2017  . Tobacco use disorder 01/06/2017  . Alcohol use disorder, moderate, dependence ([place]) 10/06/2016  . Substance induced mood disorder ([place]) 10/06/2016  . Cocaine use disorder, moderate, dependence ([place]) 10/06/2016  . Depression 10/06/2016  . Acute left-sided weakness 05/03/2015  . CVA (cerebral infarction) 05/03/2015  . Weakness 11/16/2013  . Chest pain 11/16/2013  . HTN (hypertension) 11/16/2013  . Left-sided weakness 11/16/2013  . Torsades de pointes ([place]) 11/16/2013  . Hemiplegia, unspecified, affecting nondominant side 11/15/2013  . Speech and language deficits 11/15/2013    Past Surgical History:  Procedure Laterality Date  . ANGIOPLASTY    . APPENDECTOMY    . CARDIAC CATHETERIZATION    . CHOLECYSTECTOMY    . WRIST SURGERY Right    age 49    Prior to Admission medications   Medication Sig Start Date End Date Taking? Authorizing Provider  albuterol (PROVENTIL HFA;VENTOLIN HFA) 108 (90 Base) MCG/ACT inhaler Inhale 2 puffs into the lungs every 6 (six) hours as needed for wheezing or shortness of breath. 12/22/18   [name], [name], MD  aspirin 81 MG chewable tablet Chew 1 tablet (81 mg total) by mouth daily. 12/23/18   [name], [name], MD  atorvastatin (LIPITOR) 40 MG tablet Take 1 tablet (40 mg total) by mouth daily at 6 PM. 12/22/18 08/25/19  [name], [name], MD  busPIRone (BUSPAR) 10 MG tablet Take 1 tablet (10 mg total) by mouth 3 (three) times daily. 12/22/18   [name], [name], MD  clonazePAM (KLONOPIN) 0.5 MG tablet Take 0.5 mg by mouth daily as needed for anxiety. 01/01/19   [provider]  cloNIDine (CATAPRES) 0.1 MG tablet Take 1 tablet (0.1 mg total) by mouth 2 (two) times daily as needed (BP Systolic > 235 or Disastolic > 361). 12/22/18   [name], [name], MD  escitalopram (LEXAPRO) 20 MG tablet Take 20 mg by mouth daily.    [provider]  Fluticasone-Salmeterol (ADVAIR) 250-50 MCG/DOSE AEPB Inhale 1 puff into the  lungs every 12 (twelve) hours.    [provider]  ibuprofen (ADVIL,MOTRIN) 600 MG tablet Take 1 tablet (600 mg total) by mouth every 6 (six) hours as needed for moderate pain. 12/22/18   [name], [name], MD  lisinopril (PRINIVIL,ZESTRIL) 20 MG tablet Take 1 tablet (20 mg total) by mouth daily. Patient taking differently: Take 40 mg by mouth daily.  12/22/18   [name], [name], MD  metFORMIN (GLUCOPHAGE-XR) 500 MG 24 hr tablet Take 500 mg by mouth daily. 06/02/19 06/01/20  [provider]  mometasone-formoterol (DULERA) 200-5 MCG/ACT AERO Inhale 2 puffs into the lungs 2 (two) times daily. Patient not taking: Reported on 10/05/2019 12/22/18   [name], [name], MD  omeprazole (PRILOSEC) 20 MG capsule Take 20 mg by mouth daily.    [provider]  QUEtiapine (SEROQUEL XR) 300 MG 24 hr tablet Take 1 tablet (300 mg total) by mouth at bedtime. 08/26/19   Vaughan Basta, MD  QUEtiapine (SEROQUEL XR) 400 MG 24 hr tablet Take 400 mg by mouth at bedtime.    [provider]  Sofosbuvir-Velpatasvir (EPCLUSA) 400-100 MG TABS Take 1 tablet by mouth daily at 6 PM. Patient not taking: Reported on 08/25/2019 12/23/18   [name], [name], MD  thiamine 100 MG tablet Take 1 tablet (100 mg total) by mouth daily. Patient not taking: Reported on 10/05/2019 08/26/19   Vaughan Basta, MD  zolpidem (AMBIEN) 10 MG tablet Take 1 tablet (10 mg total) by mouth at bedtime. 12/22/18   [name], [name], MD    Allergies Fluoxetine, Hydrocodone-acetaminophen, Mirtazapine, Tramadol, and Gabapentin  Family History  Problem Relation Age of Onset  . Stroke Father   . Leukemia Father   . Breast cancer Sister     Social History Social History   Tobacco Use  . Smoking status: Current Every Day Smoker    Packs/day: 1.00    Types: Cigarettes  . Smokeless tobacco: Never Used  Vaping Use  . Vaping Use: Never used  Substance Use Topics  . Alcohol use: Yes  . Drug use: Yes    Types: Cocaine,  Marijuana    Review of Systems  Review of Systems  Constitutional: Negative for chills and fever.  HENT: Negative for sore throat.   Eyes: Negative for pain.  Respiratory: Negative for cough and stridor.   Cardiovascular: Negative for chest pain.  Gastrointestinal: Negative for vomiting.  Skin: Negative for rash.  Neurological: Negative for seizures, loss of consciousness and headaches.  Psychiatric/Behavioral: Negative for suicidal ideas.  All other systems reviewed and are negative.     ____________________________________________   PHYSICAL EXAM:  VITAL SIGNS: ED Triage Vitals  Enc Vitals Group     BP 09/05/20 2214 131/77     Pulse Rate 09/05/20 2214 (!) 113     Resp 09/05/20 2214 (!) 22     Temp 09/05/20 2214 98.4 F (36.9 C)     Temp Source 09/05/20 2214 Oral     SpO2 09/05/20 2214  96 %     Weight 09/05/20 2215 280 lb (127 kg)     Height 09/05/20 2215 6' (1.829 m)     Head Circumference --      Peak Flow --      Pain Score 09/05/20 2214 8     Pain Loc --      Pain Edu? --      Excl. in [place]? --    Vitals:   09/05/20 2214 09/05/20 2315  BP: 131/77 131/77  Pulse: (!) 113 (!) 113  Resp: (!) 22   Temp: 98.4 F (36.9 C)   SpO2: 96%    Physical Exam Vitals and nursing note reviewed.  Constitutional:      Appearance: He is well-developed.  HENT:     Head: Normocephalic and atraumatic.     Right Ear: External ear normal.     Left Ear: External ear normal.     Nose: Nose normal.  Eyes:     Conjunctiva/sclera: Conjunctivae normal.  Cardiovascular:     Rate and Rhythm: Regular rhythm. Tachycardia present.     Heart sounds: No murmur heard.   Pulmonary:     Effort: Tachypnea and respiratory distress present.     Breath sounds: Wheezing present.  Abdominal:     Palpations: Abdomen is soft.     Tenderness: There is no abdominal tenderness.  Musculoskeletal:     Cervical back: Neck supple.  Skin:    General: Skin is warm and dry.     Capillary Refill:  Capillary refill takes less than 2 seconds.  Neurological:     Mental Status: He is alert and oriented to person, place, and time.      ____________________________________________   LABS (all labs ordered are listed, but only abnormal results are displayed)  Labs Reviewed  COMPREHENSIVE METABOLIC PANEL - Abnormal; Notable for the following components:      Result Value   Glucose, Bld 195 (*)    All other components within normal limits  ETHANOL - Abnormal; Notable for the following components:   Alcohol, Ethyl (B) 137 (*)    All other components within normal limits  SALICYLATE LEVEL - Abnormal; Notable for the following components:   Salicylate Lvl <3.2 (*)    All other components within normal limits  ACETAMINOPHEN LEVEL - Abnormal; Notable for the following components:   Acetaminophen (Tylenol), Serum <10 (*)    All other components within normal limits  CBC - Abnormal; Notable for the following components:   WBC 16.3 (*)    All other components within normal limits  SARS CORONAVIRUS 2 BY RT PCR (HOSPITAL ORDER, [place] [facility])  URINE DRUG SCREEN, QUALITATIVE ([facility] ONLY)  BRAIN NATRIURETIC PEPTIDE  PROCALCITONIN  TROPONIN I (HIGH SENSITIVITY)   ____________________________________________  EKG  Sinus tachycardia with a ventricular rate of 109, normal axis, unremarkable intervals, poor tracing in inferior lateral leads without clear evidence of acute ischemia. ____________________________________________  RADIOLOGY  No large effusion, edema, focal consolidation, or other acute thoracic process.  Official radiology report(s): DG Chest 2 View  Result Date: 09/05/2020 CLINICAL DATA:  Shortness of breath EXAM: CHEST - 2 VIEW COMPARISON:  08/25/2019 FINDINGS: Frontal and lateral views of the chest demonstrate an unremarkable cardiac silhouette. No airspace disease, effusion, or pneumothorax. No acute bony abnormalities. IMPRESSION: 1. No acute  intrathoracic process. Electronically Signed   By: Randa Ngo M.D.   On: 09/05/2020 23:16    ____________________________________________   PROCEDURES  Procedure(s)  performed (including Critical Care):  Procedures   ____________________________________________   INITIAL IMPRESSION / ASSESSMENT AND PLAN / ED COURSE        Patient presents with [place] to history exam for assessment of worsening suicidal ideation in the context of history of depression and anxiety as well as polysubstance abuse today.  In addition patient is noted to be tachypneic, tachycardic, and with wheezing on exam.  Regarding patient's SI and substance abuse I am concerned about his safety and I feel out IVC paperwork.  Psychiatry service and TTS consulted.  Routine psychiatric screening labs sent.  Regards to patient's shortness of breath and wheezing on exam he does endorse is a tobacco abuse history and concern about a COPD exacerbation.  Will treat with duo nebs and steroids.  Chest x-ray does not show evidence of consolidation or volume overload.  No other acute thoracic process.  Will also send pro-Cal.  If there is elevation of pro-Cal I did discuss with oncoming provider initiating antibiotics.  Care of patient signed over to oncoming divider at approximately 2330.  Plan is to follow-up chest x-ray and remaining labs and reassess patient response to initial DuoNeb therapy.  The patient has been placed in psychiatric observation due to the need to provide a safe environment for the patient while obtaining psychiatric consultation and evaluation, as well as ongoing medical and medication management to treat the patient's condition.  The patient has been placed under full IVC at this time.   ____________________________________________   FINAL CLINICAL IMPRESSION(S) / ED DIAGNOSES  Final diagnoses:  COPD exacerbation ([place])  Suicidal ideation  Cocaine abuse ([place])    Medications   mometasone-formoterol (DULERA) 200-5 MCG/ACT inhaler 2 puff (2 puffs Inhalation Given 09/05/20 2320)  lisinopril (ZESTRIL) tablet 40 mg (has no administration in time range)  ipratropium-albuterol (DUONEB) 0.5-2.5 (3) MG/3ML nebulizer solution 3 mL (has no administration in time range)  LORazepam (ATIVAN) injection 0-4 mg (2 mg Intravenous Given 09/05/20 2312)    Or  LORazepam (ATIVAN) tablet 0-4 mg ( Oral See Alternative 09/05/20 2312)  LORazepam (ATIVAN) injection 0-4 mg (has no administration in time range)    Or  LORazepam (ATIVAN) tablet 0-4 mg (has no administration in time range)  thiamine tablet 100 mg (has no administration in time range)    Or  thiamine (B-1) injection 100 mg (has no administration in time range)  methylPREDNISolone sodium succinate (SOLU-MEDROL) 125 mg/2 mL injection 125 mg (125 mg Intravenous Given 09/05/20 2311)  ipratropium-albuterol (DUONEB) 0.5-2.5 (3) MG/3ML nebulizer solution 3 mL (3 mLs Nebulization Given 09/05/20 2245)     ED Discharge Orders    None       Note:  This document was prepared using Dragon voice recognition software and may include unintentional dictation errors.   Lucrezia Starch, MD 09/05/20 2313    Lucrezia Starch, MD 09/05/20 [id number]

## 2020-09-06 ENCOUNTER — Inpatient Hospital Stay
Admission: AD | Admit: 2020-09-06 | Discharge: 2020-09-21 | DRG: 885 | Disposition: A | Discharge status: Home / Self Care | Payer: Medicaid Other | Source: Intra-hospital | Attending: Psychiatry | Admitting: Psychiatry

## 2020-09-06 ENCOUNTER — Other Ambulatory Visit: Payer: Self-pay

## 2020-09-06 ENCOUNTER — Encounter: Payer: Self-pay | Admitting: Nurse Practitioner

## 2020-09-06 DIAGNOSIS — F17210 Nicotine dependence, cigarettes, uncomplicated: Secondary | ICD-10-CM | POA: Diagnosis present

## 2020-09-06 DIAGNOSIS — R45851 Suicidal ideations: Secondary | ICD-10-CM | POA: Diagnosis present

## 2020-09-06 DIAGNOSIS — Z8673 Personal history of transient ischemic attack (TIA), and cerebral infarction without residual deficits: Secondary | ICD-10-CM | POA: Diagnosis not present

## 2020-09-06 DIAGNOSIS — F411 Generalized anxiety disorder: Secondary | ICD-10-CM | POA: Diagnosis present

## 2020-09-06 DIAGNOSIS — I2510 Atherosclerotic heart disease of native coronary artery without angina pectoris: Secondary | ICD-10-CM | POA: Diagnosis present

## 2020-09-06 DIAGNOSIS — K219 Gastro-esophageal reflux disease without esophagitis: Secondary | ICD-10-CM | POA: Diagnosis present

## 2020-09-06 DIAGNOSIS — Z599 Problem related to housing and economic circumstances, unspecified: Secondary | ICD-10-CM | POA: Diagnosis not present

## 2020-09-06 DIAGNOSIS — Z888 Allergy status to other drugs, medicaments and biological substances status: Secondary | ICD-10-CM | POA: Diagnosis not present

## 2020-09-06 DIAGNOSIS — Z23 Encounter for immunization: Secondary | ICD-10-CM | POA: Diagnosis not present

## 2020-09-06 DIAGNOSIS — I10 Essential (primary) hypertension: Secondary | ICD-10-CM | POA: Diagnosis present

## 2020-09-06 DIAGNOSIS — Z7984 Long term (current) use of oral hypoglycemic drugs: Secondary | ICD-10-CM | POA: Diagnosis not present

## 2020-09-06 DIAGNOSIS — Z79899 Other long term (current) drug therapy: Secondary | ICD-10-CM

## 2020-09-06 DIAGNOSIS — F1420 Cocaine dependence, uncomplicated: Secondary | ICD-10-CM | POA: Diagnosis present

## 2020-09-06 DIAGNOSIS — K2270 Barrett's esophagus without dysplasia: Secondary | ICD-10-CM | POA: Diagnosis present

## 2020-09-06 DIAGNOSIS — F332 Major depressive disorder, recurrent severe without psychotic features: Principal | ICD-10-CM | POA: Diagnosis present

## 2020-09-06 DIAGNOSIS — Z7982 Long term (current) use of aspirin: Secondary | ICD-10-CM

## 2020-09-06 DIAGNOSIS — J441 Chronic obstructive pulmonary disease with (acute) exacerbation: Secondary | ICD-10-CM | POA: Diagnosis present

## 2020-09-06 DIAGNOSIS — G4700 Insomnia, unspecified: Secondary | ICD-10-CM | POA: Diagnosis present

## 2020-09-06 DIAGNOSIS — R7303 Prediabetes: Secondary | ICD-10-CM | POA: Diagnosis present

## 2020-09-06 DIAGNOSIS — Z9151 Personal history of suicidal behavior: Secondary | ICD-10-CM | POA: Diagnosis present

## 2020-09-06 DIAGNOSIS — F1020 Alcohol dependence, uncomplicated: Secondary | ICD-10-CM | POA: Diagnosis present

## 2020-09-06 DIAGNOSIS — Z823 Family history of stroke: Secondary | ICD-10-CM

## 2020-09-06 DIAGNOSIS — F1410 Cocaine abuse, uncomplicated: Secondary | ICD-10-CM | POA: Diagnosis not present

## 2020-09-06 LAB — SARS CORONAVIRUS 2 BY RT PCR (HOSPITAL ORDER, PERFORMED IN ~~LOC~~ HOSPITAL LAB): SARS Coronavirus 2: NEGATIVE

## 2020-09-06 MED ORDER — PREDNISONE 20 MG PO TABS
60.0000 mg | ORAL_TABLET | Freq: Every day | ORAL | Status: DC
  Administered 2020-09-06: 60 mg via ORAL
  Filled 2020-09-06: qty 3

## 2020-09-06 MED ORDER — BUSPIRONE HCL 5 MG PO TABS
15.0000 mg | ORAL_TABLET | Freq: Three times a day (TID) | ORAL | Status: DC
  Administered 2020-09-06 – 2020-09-21 (×44): 15 mg via ORAL
  Filled 2020-09-06 (×44): qty 3

## 2020-09-06 MED ORDER — LISINOPRIL 20 MG PO TABS
20.0000 mg | ORAL_TABLET | Freq: Every day | ORAL | Status: DC
  Administered 2020-09-06 – 2020-09-07 (×2): 20 mg via ORAL
  Filled 2020-09-06: qty 1

## 2020-09-06 MED ORDER — METFORMIN HCL ER 500 MG PO TB24
500.0000 mg | ORAL_TABLET | Freq: Every day | ORAL | Status: DC
  Administered 2020-09-07 – 2020-09-21 (×15): 500 mg via ORAL
  Filled 2020-09-06 (×15): qty 1

## 2020-09-06 MED ORDER — ALUM & MAG HYDROXIDE-SIMETH 200-200-20 MG/5ML PO SUSP
30.0000 mL | ORAL | Status: DC | PRN
  Administered 2020-09-08: 30 mL via ORAL
  Filled 2020-09-06: qty 30

## 2020-09-06 MED ORDER — CLONAZEPAM 0.5 MG PO TABS
0.5000 mg | ORAL_TABLET | Freq: Two times a day (BID) | ORAL | Status: DC | PRN
  Administered 2020-09-07 – 2020-09-21 (×12): 0.5 mg via ORAL
  Filled 2020-09-06 (×12): qty 1

## 2020-09-06 MED ORDER — PREDNISONE 10 MG PO TABS: 60.0000 mg | ORAL_TABLET | Freq: Every day | ORAL | Status: DC

## 2020-09-06 MED ORDER — QUETIAPINE FUMARATE ER 200 MG PO TB24
400.0000 mg | ORAL_TABLET | Freq: Every day | ORAL | Status: DC
  Administered 2020-09-06 – 2020-09-20 (×15): 400 mg via ORAL
  Filled 2020-09-06 (×16): qty 2

## 2020-09-06 MED ORDER — INFLUENZA VAC SPLIT QUAD 0.5 ML IM SUSY
0.5000 mL | PREFILLED_SYRINGE | INTRAMUSCULAR | Status: AC
  Administered 2020-09-07: 0.5 mL via INTRAMUSCULAR
  Filled 2020-09-06: qty 0.5

## 2020-09-06 MED ORDER — PANTOPRAZOLE SODIUM 40 MG PO TBEC
40.0000 mg | DELAYED_RELEASE_TABLET | Freq: Every day | ORAL | Status: DC
  Administered 2020-09-06 – 2020-09-21 (×16): 40 mg via ORAL
  Filled 2020-09-06 (×16): qty 1

## 2020-09-06 MED ORDER — ATORVASTATIN CALCIUM 20 MG PO TABS
40.0000 mg | ORAL_TABLET | Freq: Every day | ORAL | Status: DC
  Administered 2020-09-06 – 2020-09-21 (×16): 40 mg via ORAL
  Filled 2020-09-06 (×16): qty 2

## 2020-09-06 MED ORDER — MAGNESIUM HYDROXIDE 400 MG/5ML PO SUSP: 30.0000 mL | Freq: Every day | ORAL | Status: DC | PRN

## 2020-09-06 MED ORDER — IPRATROPIUM-ALBUTEROL 0.5-2.5 (3) MG/3ML IN SOLN
3.0000 mL | Freq: Once | RESPIRATORY_TRACT | Status: AC
  Administered 2020-09-06: 3 mL via RESPIRATORY_TRACT
  Filled 2020-09-06: qty 3

## 2020-09-06 MED ORDER — ASPIRIN 81 MG PO CHEW
81.0000 mg | CHEWABLE_TABLET | Freq: Every day | ORAL | Status: DC
  Administered 2020-09-06 – 2020-09-21 (×16): 81 mg via ORAL
  Filled 2020-09-06 (×16): qty 1

## 2020-09-06 MED ORDER — MOMETASONE FURO-FORMOTEROL FUM 200-5 MCG/ACT IN AERO
2.0000 | INHALATION_SPRAY | Freq: Two times a day (BID) | RESPIRATORY_TRACT | Status: DC
  Administered 2020-09-06 – 2020-09-21 (×30): 2 via RESPIRATORY_TRACT
  Filled 2020-09-06: qty 8.8

## 2020-09-06 MED ORDER — LORAZEPAM 1 MG PO TABS
1.0000 mg | ORAL_TABLET | Freq: Four times a day (QID) | ORAL | Status: AC | PRN
  Administered 2020-09-06 – 2020-09-08 (×3): 1 mg via ORAL
  Filled 2020-09-06 (×3): qty 1

## 2020-09-06 MED ORDER — LISINOPRIL 20 MG PO TABS
40.0000 mg | ORAL_TABLET | Freq: Every day | ORAL | Status: DC
  Administered 2020-09-07 – 2020-09-21 (×15): 40 mg via ORAL
  Filled 2020-09-06 (×16): qty 2

## 2020-09-06 MED ORDER — ZOLPIDEM TARTRATE 5 MG PO TABS
10.0000 mg | ORAL_TABLET | Freq: Every evening | ORAL | Status: DC | PRN
  Administered 2020-09-06 – 2020-09-20 (×15): 10 mg via ORAL
  Filled 2020-09-06 (×15): qty 2

## 2020-09-06 MED ORDER — IPRATROPIUM-ALBUTEROL 0.5-2.5 (3) MG/3ML IN SOLN
3.0000 mL | RESPIRATORY_TRACT | Status: AC | PRN
  Filled 2020-09-06: qty 3

## 2020-09-06 MED ORDER — NICOTINE 21 MG/24HR TD PT24
21.0000 mg | MEDICATED_PATCH | Freq: Every day | TRANSDERMAL | Status: DC
  Administered 2020-09-06 – 2020-09-20 (×15): 21 mg via TRANSDERMAL
  Filled 2020-09-06 (×16): qty 1

## 2020-09-06 MED ORDER — ESCITALOPRAM OXALATE 10 MG PO TABS
20.0000 mg | ORAL_TABLET | Freq: Every day | ORAL | Status: DC
  Administered 2020-09-06 – 2020-09-21 (×16): 20 mg via ORAL
  Filled 2020-09-06 (×17): qty 2

## 2020-09-06 MED ORDER — HYDROCHLOROTHIAZIDE 25 MG PO TABS
25.0000 mg | ORAL_TABLET | Freq: Every day | ORAL | Status: DC
  Administered 2020-09-06 – 2020-09-21 (×16): 25 mg via ORAL
  Filled 2020-09-06 (×16): qty 1

## 2020-09-06 NOTE — H&P (Signed)
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  Patient known from previous encounters.  This is a 59 year old man with a long history of depression and anxiety and substance abuse.  Came to the emergency room with reports of depressed mood severe negativity extreme anxiety thoughts of suicide with specific thoughts of shooting himself.  Patient reports that the stresses in his life just seem to get worse all the time.  He is behind on the rent for the lot that his trailer sits on.  He is worried that his light bill might not be paid.  He feels constantly on the edge of financial disaster.  His application for supplemental payment for housing has been somehow delayed.  Patient reports that he has talked to [facility] on the phone but has not been able to go there in person for over a year because of the pandemic.  He says he still has been compliant with his medicine but feels that it has not been adequate for him.  He has been sleeping poorly.  Has little to eat at times.  He says he had been staying sober until a friend of his came over the other day with some alcohol and cocaine and he had several beers and some cocaine which pushed him over the edge. Associated Signs/Symptoms: Depression Symptoms:  insomnia, difficulty concentrating, hopelessness, suicidal thoughts with specific plan, Duration of Depression Symptoms: No data recorded (Hypo) Manic Symptoms:   Impulsivity, Anxiety Symptoms:  Excessive Worry, Psychotic Symptoms:  Not reported Duration of Psychotic Symptoms: No data recorded PTSD Symptoms: Negative Total Time spent with patient: 1 hour  Past Psychiatric History: Patient has a long history of depression and anxiety.  History of substance abuse with alcohol prominent.  Major life stresses of the financial type of been a chronic worry.  Does have a past history of suicidality.  No history of mania or aggression.  Is the patient at risk to self? Yes.    Has the patient been a risk to self in the past 6 months? Yes.    Has the patient been a risk to self within the distant past? Yes.    Is the patient a risk to others? No.  Has the patient been a risk to others in the past 6 months? No.  Has the patient been a risk to others within the distant past? No.   Prior Inpatient Therapy:   Prior Outpatient Therapy:    Alcohol Screening: 1. How often do you have a drink containing alcohol?: Monthly or less 2. How many drinks containing alcohol do you have on a typical day when you are drinking?: 10 or more 3. How often do you have six or more drinks on one occasion?: Less than monthly AUDIT-C Score: 6 4. How often during the last year have you found that you were not able to stop drinking once you had started?: Never 5. How often during the last year have you failed to do what was normally expected from you because of drinking?: Never 6. How  often during the last year have you needed a first drink in the morning to get yourself going after a heavy drinking session?: Never 7. How often during the last year have you had a feeling of guilt of remorse after drinking?: Never 8. How often during the last year have you been unable to remember what happened the night before because you had been drinking?: Never 9. Have you or someone else been injured as a result of your drinking?: No 10. Has a relative or friend or a doctor or another health worker  been concerned about your drinking or suggested you cut down?: No Alcohol Use Disorder Identification Test Final Score (AUDIT): 6 Alcohol Brief Interventions/Follow-up: AUDIT Score <7 follow-up not indicated Substance Abuse History in the last 12 months:  Yes.   Consequences of Substance Abuse: Medical Consequences:  Worsening of his mood with suicidal thoughts Previous Psychotropic Medications: Yes  Psychological Evaluations: Yes  Past Medical History:  Past Medical History:  Diagnosis Date  . Anxiety   . Barrett esophagus   . Cancer ([place])   . Coronary artery disease   . Depression   . GERD (gastroesophageal reflux disease)   . Hypertension   . Pre-diabetes   . Stroke [facility]) 2014   "mini-stroke" per patient    Past Surgical History:  Procedure Laterality Date  . ANGIOPLASTY    . APPENDECTOMY    . CARDIAC CATHETERIZATION    . CHOLECYSTECTOMY    . WRIST SURGERY Right    age 68   Family History:  Family History  Problem Relation Age of Onset  . Stroke Father   . Leukemia Father   . Breast cancer Sister    Family Psychiatric  History: Positive for multiple people with severe anxiety as well as substance abuse Tobacco Screening: Have you used any form of tobacco in the last 30 days? (Cigarettes, Smokeless Tobacco, Cigars, and/or Pipes): Yes Tobacco use, Select all that apply: 5 or more cigarettes per day Are you interested in Tobacco Cessation Medications?: Yes, will notify MD for an order Counseled patient on smoking cessation including recognizing danger situations, developing coping skills and basic information about quitting provided: Yes Social History:  Social History   Substance and Sexual Activity  Alcohol Use Yes     Social History   Substance and Sexual Activity  Drug Use Yes  . Types: Cocaine, Marijuana    Additional Social History:                           Allergies:   Allergies  Allergen Reactions  . Fluoxetine Swelling    Anxiety,  itching  . Hydrocodone-Acetaminophen Itching  . Mirtazapine Other (See Comments)    nightmares  . Tramadol Other (See Comments)  . Gabapentin Other (See Comments)    Nightmares    Lab Results:  Results for orders placed or performed during the hospital encounter of 09/05/20 (from the past 48 hour(s))  Comprehensive metabolic panel     Status: Abnormal   Collection Time: 09/05/20 10:20 PM  Result Value Ref Range   Sodium 140 135 - 145 mmol/L   Potassium 3.8 3.5 - 5.1 mmol/L   Chloride 101 98 - 111 mmol/L   CO2 25 22 - 32 mmol/L   Glucose, Bld 195 (H) 70 - 99 mg/dL    Comment: Glucose reference range applies only to samples taken after fasting for at least 8 hours.   BUN 10 6 -  20 mg/dL   Creatinine, Ser 0.85 0.61 - 1.24 mg/dL   Calcium 9.1 8.9 - 10.3 mg/dL   Total Protein 7.3 6.5 - 8.1 g/dL   Albumin 3.9 3.5 - 5.0 g/dL   AST 19 15 - 41 U/L   ALT 24 0 - 44 U/L   Alkaline Phosphatase 66 38 - 126 U/L   Total Bilirubin 0.4 0.3 - 1.2 mg/dL   GFR calc non Af Amer >60 >60 mL/min   GFR calc Af Amer >60 >60 mL/min   Anion gap 14 5 - 15    Comment: Performed at [facility], [address] [place], [place] [address]  Ethanol     Status: Abnormal   Collection Time: 09/05/20 10:20 PM  Result Value Ref Range   Alcohol, Ethyl (B) 137 (H) <10 mg/dL    Comment: (NOTE) Lowest detectable limit for serum alcohol is 10 mg/dL.  For medical purposes only. Performed at [facility], [address] [place], [name] [address]   Salicylate level     Status: Abnormal   Collection Time: 09/05/20 10:20 PM  Result Value Ref Range   Salicylate Lvl <6.3 (L) 7.0 - 30.0 mg/dL    Comment: Performed at [facility], [address] [place], [place] [address]  Acetaminophen level     Status: Abnormal   Collection Time: 09/05/20 10:20 PM  Result Value Ref Range   Acetaminophen (Tylenol), Serum <10 (L) 10 - 30 ug/mL    Comment: (NOTE) Therapeutic concentrations vary  significantly. A range of 10-30 ug/mL  may be an effective concentration for many patients. However, some  are best treated at concentrations outside of this range. Acetaminophen concentrations >150 ug/mL at 4 hours after ingestion  and >50 ug/mL at 12 hours after ingestion are often associated with  toxic reactions.  Performed at [facility], [address] [place], [place] [address]   cbc     Status: Abnormal   Collection Time: 09/05/20 10:20 PM  Result Value Ref Range   WBC 16.3 (H) 4.0 - 10.5 K/uL   RBC 4.90 4.22 - 5.81 MIL/uL   Hemoglobin 14.5 13.0 - 17.0 g/dL   HCT 44.3 39 - 52 %   MCV 90.4 80.0 - 100.0 fL   MCH 29.6 26.0 - 34.0 pg   MCHC 32.7 30.0 - 36.0 g/dL   RDW 12.9 11.5 - 15.5 %   Platelets 240 150 - 400 K/uL   nRBC 0.0 0.0 - 0.2 %    Comment: Performed at [facility], [address], [place], [place] [address]  Brain natriuretic peptide     Status: None   Collection Time: 09/05/20 10:20 PM  Result Value Ref Range   B Natriuretic Peptide 25.6 0.0 - 100.0 pg/mL    Comment: Performed at [facility], [address] [place], [place] [address]  Procalcitonin - Baseline     Status: None   Collection Time: 09/05/20 10:20 PM  Result Value Ref Range   Procalcitonin <0.10 ng/mL    Comment:        Interpretation: PCT (Procalcitonin) <= 0.5 ng/mL: Systemic infection (sepsis) is not likely. Local bacterial infection is possible. (NOTE)       Sepsis PCT Algorithm           Lower Respiratory Tract  Infection PCT Algorithm    ----------------------------     ----------------------------         PCT < 0.25 ng/mL                PCT < 0.10 ng/mL          Strongly encourage             Strongly discourage   discontinuation of antibiotics    initiation of antibiotics    ----------------------------     -----------------------------       PCT 0.25 - 0.50 ng/mL            PCT 0.10 - 0.25 ng/mL                OR       >80% decrease in PCT            Discourage initiation of                                            antibiotics      Encourage discontinuation           of antibiotics    ----------------------------     -----------------------------         PCT >= 0.50 ng/mL              PCT 0.26 - 0.50 ng/mL               AND        <80% decrease in PCT             Encourage initiation of                                             antibiotics       Encourage continuation           of antibiotics    ----------------------------     -----------------------------        PCT >= 0.50 ng/mL                  PCT > 0.50 ng/mL               AND         increase in PCT                  Strongly encourage                                      initiation of antibiotics    Strongly encourage escalation           of antibiotics                                     -----------------------------                                           PCT <= 0.25 ng/mL  OR                                        > 80% decrease in PCT                                      Discontinue / Do not initiate                                             antibiotics  Performed at [facility], [address] [place], [place] [address]   SARS Coronavirus 2 by RT PCR (hospital order, performed in [facility] hospital lab) Nasopharyngeal Nasopharyngeal Swab     Status: None   Collection Time: 09/06/20 12:44 AM   Specimen: Nasopharyngeal Swab  Result Value Ref Range   SARS Coronavirus 2 NEGATIVE NEGATIVE    Comment: (NOTE) SARS-CoV-2 target nucleic acids are NOT DETECTED.  The SARS-CoV-2 RNA is generally detectable in upper and lower respiratory specimens during the acute phase of infection. The lowest concentration of SARS-CoV-2 viral copies this assay can detect is 250 copies / mL. A negative result does not preclude SARS-CoV-2 infection and should not be used as the  sole basis for treatment or other patient management decisions.  A negative result may occur with improper specimen collection / handling, submission of specimen other than nasopharyngeal swab, presence of viral mutation(s) within the areas targeted by this assay, and inadequate number of viral copies (<250 copies / mL). A negative result must be combined with clinical observations, patient history, and epidemiological information.  Fact Sheet for Patients:   StrictlyIdeas.no  Fact Sheet for Healthcare Providers: BankingDealers.co.za  This test is not yet approved or  cleared by the [place] FDA and has been authorized for detection and/or diagnosis of SARS-CoV-2 by FDA under an Emergency Use Authorization (EUA).  This EUA will remain in effect (meaning this test can be used) for the duration of the COVID-19 declaration under Section 564(b)(1) of the Act, 21 U.S.C. section 360bbb-3(b)(1), unless the authorization is terminated or revoked sooner.  Performed at [facility], [address] [place], [place] [address]     Blood Alcohol level:  Lab Results  Component Value Date   ETH 137 (H) 09/05/2020   ETH <10 46/65/9935    Metabolic Disorder Labs:  Lab Results  Component Value Date   HGBA1C 7.4 (H) 08/26/2019   MPG 165.68 08/26/2019   MPG 136.98 12/13/2018   No results found for: PROLACTIN Lab Results  Component Value Date   CHOL 178 08/26/2019   TRIG 375 (H) 08/26/2019   HDL 23 (L) 08/26/2019   CHOLHDL 7.7 08/26/2019   VLDL 75 (H) 08/26/2019   LDLCALC 80 08/26/2019   [place] 70 12/13/2018    Current Medications: Current Facility-Administered Medications  Medication Dose Route Frequency Provider Last Rate Last Admin  . alum & mag hydroxide-simeth (MAALOX/MYLANTA) 200-200-20 MG/5ML suspension 30 mL  30 mL Oral Q4H PRN Lindon Romp A, NP      . aspirin chewable tablet 81 mg  81 mg Oral Daily Andria Head,  Raylan Troiani T, MD      . atorvastatin (LIPITOR) tablet 40 mg  40 mg Oral  Daily Augustino Savastano, [name], MD      . busPIRone (BUSPAR) tablet 15 mg  15 mg Oral TID Han Lysne T, MD      . clonazePAM (KLONOPIN) tablet 0.5 mg  0.5 mg Oral BID PRN Gevorg Brum T, MD      . escitalopram (LEXAPRO) tablet 20 mg  20 mg Oral Daily Armani Brar T, MD      . hydrochlorothiazide (HYDRODIURIL) tablet 25 mg  25 mg Oral Daily Camillia Marcy T, MD      . [name] ON 09/07/2020] influenza vac split quadrivalent PF (FLUARIX) injection 0.5 mL  0.5 mL Intramuscular Tomorrow-1000 Cherie Lasalle T, MD      . ipratropium-albuterol (DUONEB) 0.5-2.5 (3) MG/3ML nebulizer solution 3 mL  3 mL Nebulization Q4H PRN Lindon Romp A, NP      . lisinopril (ZESTRIL) tablet 20 mg  20 mg Oral Daily Hajra Port T, MD      . lisinopril (ZESTRIL) tablet 40 mg  40 mg Oral Daily Lindon Romp A, NP      . LORazepam (ATIVAN) tablet 1 mg  1 mg Oral Q6H PRN Lindon Romp A, NP   1 mg at 09/06/20 1127  . magnesium hydroxide (MILK OF MAGNESIA) suspension 30 mL  30 mL Oral Daily PRN Rozetta Nunnery, NP      . [name] ON 09/07/2020] metFORMIN (GLUCOPHAGE-XR) 24 hr tablet 500 mg  500 mg Oral Q breakfast Jeoffrey Eleazer T, MD      . mometasone-formoterol (DULERA) 200-5 MCG/ACT inhaler 2 puff  2 puff Inhalation BID Lindon Romp A, NP      . nicotine (NICODERM CQ - dosed in mg/24 hours) patch 21 mg  21 mg Transdermal Daily Dakarai Mcglocklin, [name], MD   21 mg at 09/06/20 1549  . pantoprazole (PROTONIX) EC tablet 40 mg  40 mg Oral Daily Guynell Kleiber T, MD      . QUEtiapine (SEROQUEL XR) 24 hr tablet 400 mg  400 mg Oral QHS Chassidy Layson T, MD      . zolpidem (AMBIEN) tablet 10 mg  10 mg Oral QHS PRN [place] Pulse, [name], MD       PTA Medications: Medications Prior to Admission  Medication Sig Dispense Refill Last Dose  . ADVAIR DISKUS 500-50 MCG/DOSE AEPB Inhale 1 puff into the lungs every 12 (twelve) hours.     Marland Kitchen albuterol (PROVENTIL HFA;VENTOLIN HFA) 108 (90 Base) MCG/ACT  inhaler Inhale 2 puffs into the lungs every 6 (six) hours as needed for wheezing or shortness of breath. 1 Inhaler 0   . aspirin 81 MG chewable tablet Chew 1 tablet (81 mg total) by mouth daily. 30 tablet 1   . atorvastatin (LIPITOR) 40 MG tablet Take 1 tablet (40 mg total) by mouth daily at 6 PM. 7 tablet 0   . busPIRone (BUSPAR) 10 MG tablet Take 1 tablet (10 mg total) by mouth 3 (three) times daily. (Patient taking differently: Take 15 mg by mouth 3 (three) times daily. ) 90 tablet 1   . clonazePAM (KLONOPIN) 0.5 MG tablet Take 0.5 mg by mouth daily as needed for anxiety. (Patient not taking: Reported on 09/06/2020)     . cloNIDine (CATAPRES) 0.1 MG tablet Take 1 tablet (0.1 mg total) by mouth 2 (two) times daily as needed (BP Systolic > 478 or Disastolic > 295). 60 tablet 1   . escitalopram (LEXAPRO) 20 MG tablet Take 20 mg by mouth daily.     . hydrochlorothiazide (HYDRODIURIL) 25  MG tablet Take 25 mg by mouth daily.     Marland Kitchen ibuprofen (ADVIL,MOTRIN) 600 MG tablet Take 1 tablet (600 mg total) by mouth every 6 (six) hours as needed for moderate pain. 60 tablet 1   . lisinopril (PRINIVIL,ZESTRIL) 20 MG tablet Take 1 tablet (20 mg total) by mouth daily. (Patient taking differently: Take 40 mg by mouth daily. ) 7 tablet 0   . metFORMIN (GLUCOPHAGE-XR) 500 MG 24 hr tablet Take 500 mg by mouth daily.     . mometasone-formoterol (DULERA) 200-5 MCG/ACT AERO Inhale 2 puffs into the lungs 2 (two) times daily. (Patient not taking: Reported on 10/05/2019) 1 Inhaler 1   . omeprazole (PRILOSEC) 20 MG capsule Take 20 mg by mouth daily.     . QUEtiapine (SEROQUEL XR) 300 MG 24 hr tablet Take 1 tablet (300 mg total) by mouth at bedtime. (Patient not taking: Reported on 09/06/2020) 30 tablet 1   . QUEtiapine (SEROQUEL XR) 400 MG 24 hr tablet Take 400 mg by mouth at bedtime.     . Sofosbuvir-Velpatasvir (EPCLUSA) 400-100 MG TABS Take 1 tablet by mouth daily at 6 PM. (Patient not taking: Reported on 08/25/2019) 30 tablet 0    . thiamine 100 MG tablet Take 1 tablet (100 mg total) by mouth daily. (Patient not taking: Reported on 10/05/2019) 30 tablet 1   . zolpidem (AMBIEN) 10 MG tablet Take 1 tablet (10 mg total) by mouth at bedtime. 30 tablet 1     Musculoskeletal: Strength & Muscle Tone: within normal limits Gait & Station: normal Patient leans: N/A  Psychiatric Specialty Exam: Physical Exam Constitutional:      Appearance: He is well-developed.  HENT:     Head: Normocephalic and atraumatic.  Eyes:     Conjunctiva/sclera: Conjunctivae normal.     Pupils: Pupils are equal, round, and reactive to light.  Cardiovascular:     Heart sounds: Normal heart sounds.  Pulmonary:     Effort: Respiratory distress present.  Abdominal:     Palpations: Abdomen is soft.  Musculoskeletal:        General: Normal range of motion.     Cervical back: Normal range of motion.  Skin:    General: Skin is warm and dry.  Neurological:     Mental Status: He is alert.  Psychiatric:        Attention and Perception: He is inattentive.        Mood and Affect: Mood is anxious and depressed.        Speech: Speech normal.        Behavior: Behavior is agitated. Behavior is not aggressive.        Thought Content: Thought content includes suicidal ideation. Thought content does not include suicidal plan.        Cognition and Memory: Cognition normal.        Judgment: Judgment is impulsive.     Review of Systems  Constitutional: Negative.   HENT: Negative.   Eyes: Negative.   Respiratory: Negative.   Cardiovascular: Negative.   Gastrointestinal: Negative.   Musculoskeletal: Negative.   Skin: Negative.   Neurological: Negative.   Psychiatric/Behavioral: Positive for dysphoric mood and suicidal ideas.    Blood pressure (!) 143/99, pulse (!) 101, temperature 98.5 F (36.9 C), temperature source Oral, resp. rate (!) 22, SpO2 94 %.There is no height or weight on file to calculate BMI.  General Appearance: Casual  Eye  Contact:  Fair  Speech:  Clear and Coherent  Volume:  Increased  Mood:  Anxious, Depressed and Hopeless  Affect:  Congruent  Thought Process:  Coherent  Orientation:  Full (Time, Place, and Person)  Thought Content:  Logical  Suicidal Thoughts:  Yes.  with intent/plan  Homicidal Thoughts:  No  Memory:  Immediate;   Fair Recent;   Poor Remote;   Fair  Judgement:  Fair  Insight:  Fair  Psychomotor Activity:  Restlessness  Concentration:  Concentration: Fair  Recall:  [employer] of Knowledge:  Fair  Language:  Fair  Akathisia:  No  Handed:  Right  AIMS (if indicated):     Assets:  Desire for Improvement Housing Resilience  ADL's:  Impaired  Cognition:  Impaired,  Mild  Sleep:       Treatment Plan Summary: Daily contact with patient to assess and evaluate symptoms and progress in treatment, Medication management and Plan Patient with chronic recurrent severe depression without psychotic features as well as generalized anxiety disorder.  Relapsed into alcohol abuse but not having active withdrawal symptoms.  Patient is currently cooperative with treatment.  Also severe chronic medical problems with severe COPD as well as dyslipidemia and hypertension.  Patient was put on prednisone in the emergency room which is going to make his anxiety much worse.  I will discontinue that and make sure he has his nebulizers available.  Continue with higher dose of Lexapro at 20 mg a day and BuSpar 15 3 times a day.  Restart modest Klonopin as needed.  Engage in individual and group therapy and reassessment with presumed follow-up with [facility] at discharge  Observation Level/Precautions:  15 minute checks  Laboratory:  Chemistry Profile  Psychotherapy:    Medications:    Consultations:    Discharge Concerns:    Estimated LOS:  Other:     Physician Treatment Plan for Primary Diagnosis: Severe recurrent major depression ([place]) Long Term Goal(s): Improvement in symptoms so as ready for  discharge  Short Term Goals: Ability to disclose and discuss suicidal ideas and Ability to demonstrate self-control will improve  Physician Treatment Plan for Secondary Diagnosis: Principal Problem:   Severe recurrent major depression ([place]) Active Problems:   HTN (hypertension)   Alcohol use disorder, moderate, dependence ([facility])   Cocaine use disorder, moderate, dependence ([place])   Generalized anxiety disorder   COPD exacerbation ([place])  Long Term Goal(s): Improvement in symptoms so as ready for discharge  Short Term Goals: Ability to maintain clinical measurements within normal limits will improve, Compliance with prescribed medications will improve and Ability to identify triggers associated with substance abuse/mental health issues will improve  I certify that inpatient services furnished can reasonably be expected to improve the patient's condition.    Alethia Berthold, MD 9/16/20214:34 PM

## 2020-09-06 NOTE — Progress Notes (Signed)
Patient calm and pleasant during assessment. Pt denies SI/HI/AVH. Patient endorses anxiety and depression. Patient compliant with medication administration per MD orders. Pt observed interacting appropriately with staff and peers on the unit. Patient being monitored Q 15 minutes for safety per unit protocol. Patient remains safe on the unit.

## 2020-09-06 NOTE — ED Notes (Signed)
Hourly rounding reveals patient in room. No complaints, stable, in no acute distress. Q15 minute rounds and monitoring via Rover and Officer to continue.   

## 2020-09-06 NOTE — BH Assessment (Signed)
Patient is to be admitted to [facility] by Psychiatric Nurse Practitioner Lindon Romp.  Attending Physician will be Dr. Weber Cooks.   Patient has been assigned to room 306, by [facility] Charge Nurse Benita Stabile.   Intake Paper Work has been signed and placed on patient chart.  ER staff is aware of the admission:  ER Secretary    Dr. Alfred Levins, ER MD   [facility] Patient's Nurse   Helene Kelp, Patient Access.  Pt can be transported after 8am.

## 2020-09-06 NOTE — ED Provider Notes (Signed)
Chest x-ray negative for pneumonia.  Procalcitonin is negative therefore will hold off antibiotics.  Patient received 2 more duo nebs.  He has normal work of breathing, normal sats both at rest and with ambulation.  He is medically clear for psychiatric admission.  Patient was started on prednisone 60 mg daily for the next 4 days and has DuoNeb every 4 hours ordered.   Alfred Levins, [place], MD 09/06/20 [phone]

## 2020-09-06 NOTE — B[facility] Group Notes (Signed)
[facility] Group Notes:  (Nursing/MHT/Case Management/Adjunct)  Date:  09/06/2020  Time:  8:58 PM  Type of Therapy:  Group Therapy  Participation Level:  Active  Participation Quality:  Appropriate  Affect:  Appropriate  Cognitive:  Alert  Insight:  Good  Engagement in Group:  Engaged and first he said he had no goal and his goal was not to come back in here.  Modes of Intervention:  Support  Summary of Progress/Problems:  Ian [name] 09/06/2020, 8:58 PM

## 2020-09-06 NOTE — BHH Suicide Risk Assessment (Signed)
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  59 year old man well known from previous encounters with a history of substance abuse and depression and anxiety comes into the hospital reporting depressed mood hopelessness sleeplessness severe anxiety and active suicidal ideation with thought of shooting himself.  Has relapsed into use of alcohol and some cocaine.  Continued Clinical Symptoms:  Alcohol Use Disorder Identification Test Final Score (AUDIT): 6 The "Alcohol Use Disorders Identification Test", Guidelines for Use in Primary Care, Second Edition.  World Pharmacologist [facility]). Score between 0-7:  no or low risk or alcohol related problems. Score between 8-15:  moderate risk of alcohol related problems. Score between 16-19:  high risk of alcohol related problems. Score 20 or above:  warrants further diagnostic evaluation for alcohol dependence and treatment.   CLINICAL FACTORS:   Depression:   Comorbid alcohol abuse/dependence Alcohol/Substance Abuse/Dependencies   Musculoskeletal: Strength & Muscle Tone: within normal limits Gait & Station: normal Patient leans: N/A  Psychiatric Specialty Exam: Physical Exam Vitals and nursing note reviewed.  Constitutional:      Appearance: He is  well-developed.  HENT:     Head: Normocephalic and atraumatic.  Eyes:     Conjunctiva/sclera: Conjunctivae normal.     Pupils: Pupils are equal, round, and reactive to light.  Cardiovascular:     Heart sounds: Normal heart sounds.  Pulmonary:     Effort: Pulmonary effort is normal.  Abdominal:     Palpations: Abdomen is soft.  Musculoskeletal:        General: Normal range of motion.     Cervical back: Normal range of motion.  Skin:    General: Skin is warm and dry.  Neurological:     General: No focal deficit present.     Mental Status: He is alert.  Psychiatric:        Attention and Perception: Attention normal.        Mood and Affect: Mood is anxious and depressed.        Speech: Speech normal.        Behavior: Behavior is agitated. Behavior is not aggressive.        Thought Content: Thought content includes suicidal ideation. Thought content includes suicidal plan.        Cognition and Memory: Cognition is impaired.        Judgment: Judgment is impulsive.     Review of Systems  Constitutional: Negative.   HENT: Negative.   Eyes: Negative.   Respiratory: Negative.   Cardiovascular: Negative.   Gastrointestinal: Negative.   Musculoskeletal: Negative.   Skin: Negative.   Neurological: Negative.   Psychiatric/Behavioral: Positive for dysphoric mood, sleep disturbance and suicidal ideas. The patient is nervous/anxious.     Blood pressure (!) 143/99, pulse (!) 101, temperature 98.5 F (36.9 C), temperature source Oral, resp. rate (!) 22, SpO2 94 %.There is no height or weight  on file to calculate BMI.  General Appearance: Disheveled  Eye Contact:  Fair  Speech:  Clear and Coherent  Volume:  Increased  Mood:  Anxious and Depressed  Affect:  Congruent  Thought Process:  Coherent  Orientation:  Full (Time, Place, and Person)  Thought Content:  Rumination and Tangential  Suicidal Thoughts:  Yes.  with intent/plan  Homicidal Thoughts:  No  Memory:  Immediate;    Fair Recent;   Fair Remote;   Fair  Judgement:  Impaired  Insight:  Fair  Psychomotor Activity:  Restlessness  Concentration:  Concentration: Fair  Recall:  [employer] of Knowledge:  Fair  Language:  Fair  Akathisia:  No  Handed:  Right  AIMS (if indicated):     Assets:  Desire for Improvement Housing Resilience  ADL's:  Impaired  Cognition:  Impaired,  Mild  Sleep:         COGNITIVE FEATURES THAT CONTRIBUTE TO RISK:  Polarized thinking    SUICIDE RISK:   Moderate:  Frequent suicidal ideation with limited intensity, and duration, some specificity in terms of plans, no associated intent, good self-control, limited dysphoria/symptomatology, some risk factors present, and identifiable protective factors, including available and accessible social support.  PLAN OF CARE: Continue 15-minute checks.  We will continue psychiatric medicine with some increase in antianxiety medicine.  Monitor for signs of withdrawal.  Engage in individual and group therapy.  Work on any possible improvements we can find a social situation.  Arrange for appropriate treatment and reassessment of dangerousness prior to discharge  I certify that inpatient services furnished can reasonably be expected to improve the patient's condition.   Alethia Berthold, MD 09/06/2020, 4:31 PM

## 2020-09-06 NOTE — Progress Notes (Deleted)
Recreation Therapy Notes  Date: 09/06/2020  Time: 9:30 am   Location: Craft room     Behavioral response: N/A   Intervention Topic: Communication   Discussion/Intervention: Patient did not attend group.   Clinical Observations/Feedback:  Patient did not attend group.   Chellsie Gomer LRT/CTRS        Janaisha Tolsma 09/06/2020 12:41 PM

## 2020-09-06 NOTE — BH Assessment (Addendum)
Assessment Note  Ian [name] is an 59 y.o. male. Per triage note: Pt in via [place] [employer] voluntarily.  Pt states, "I'm just not a happy camper right now, it's been coming on."  Reports suicidal ideation, denies any plan.  Tearful in triage. Reports being off of psych meds for a few weeks.    Pt in scrubs and presents with poor hygiene. He is alert and oriented x4. Pt speaks with slightly slurred speech, at loud volume and normal pace. Motor behavior appears normal. Patient is visibly depressed and tearful. Eye contact is fair. Pt's mood is sullen and anxious, affect is congruent with mood. Thought process is coherent and relevant. There is no indication Pt is currently responding to internal stimuli or experiencing delusional thought content. Pt was cooperative throughout assessment. Pt became severely anxious when explaining his current situation. Pt reports that he is worries about food and has a lack in resources. Pt reports that he owns his trailer, however he still has to pay lot rent and he does not have the money. The patient explained that he is a hoarder and the conditions of the mobile home are uninhabitable; reporting that he can hardly ambulate through the house. Pt explained that he has frequent falls. Patient expressed increased feelings of hopelessness and worthlessness due to his declining physical health. Pt reported that he has a fire home in the home. Pt reported that no one would be able to find it when asked if a family member could go retrieve the gun. Pt explained that he has no family supports. Pt explained that he formerly relied on his peer support; however, there services have become more regulated and he can only contact them in the event of an emergency. Pt reported that he has services through [facility]. Pt admitted to using alcohol and cocaine today. Pt explained that his use led him into a panic attack. Pt continues to endorse SI with a plan to use his gun to end things. Pt reported  that he prays that the cancer would facilitate his death. Pt repeated, "I just don't care" multiple times throughout the interview process. Pt reported previous SI attempts and multiple inpatient hospitalizations. The patient expressed that he is experiencing worsening dysphoria, anhedonia, and isolating behaviors. The patient explained that he lays on the couch and doesn't move for days at a time. The patient denied HI, AV/H or symptoms of paranoia. Pt reported that he has poor sleep and only sleeps for 3-4 hours at a time. Pt expressed that he'd like to get back on Ambien as it aided his ability to fall asleep.   Diagnosis:  303.90 Alcohol use disorder, Moderate 304.20 Cocaine use disorder, Moderate 296.33 Major depressive disorder, Recurrent episode, Severe  Past Medical History:  Past Medical History:  Diagnosis Date  . Anxiety   . Barrett esophagus   . Cancer ([place])   . Coronary artery disease   . Depression   . GERD (gastroesophageal reflux disease)   . Hypertension   . Pre-diabetes   . Stroke [facility]) 2014   "mini-stroke" per patient    Past Surgical History:  Procedure Laterality Date  . ANGIOPLASTY    . APPENDECTOMY    . CARDIAC CATHETERIZATION    . CHOLECYSTECTOMY    . WRIST SURGERY Right    age 33    Family History:  Family History  Problem Relation Age of Onset  . Stroke Father   . Leukemia Father   . Breast cancer Sister  Social History:  reports that he has been smoking cigarettes. He has been smoking about 1.00 pack per day. He has never used smokeless tobacco. He reports current alcohol use. He reports current drug use. Drugs: Cocaine and Marijuana.  Additional Social History:  Alcohol / Drug Use Pain Medications: See PTA Prescriptions: See PTA History of alcohol / drug use?: Yes Negative Consequences of Use: Financial, Personal relationships Substance #1 Name of Substance 1: Alcohol 1 - Amount (size/oz): (3) 40 oz natural lights 1 - Last Use / Amount:  09/06/20 Substance #2 Name of Substance 2: Cocaine  CIWA: CIWA-Ar BP: 131/77 Pulse Rate: (!) 113 Nausea and Vomiting: no nausea and no vomiting Tactile Disturbances: very mild itching, pins and needles, burning or numbness Tremor: three Auditory Disturbances: very mild harshness or ability to frighten Paroxysmal Sweats: two Visual Disturbances: very mild sensitivity Anxiety: two Headache, Fullness in Head: none present Agitation: two Orientation and Clouding of Sensorium: oriented and can do serial additions CIWA-Ar Total: 12 COWS:    Allergies:  Allergies  Allergen Reactions  . Fluoxetine Swelling    Anxiety, itching  . Hydrocodone-Acetaminophen Itching  . Mirtazapine Other (See Comments)    nightmares  . Tramadol Other (See Comments)  . Gabapentin Other (See Comments)    Nightmares     Home Medications: (Not in a hospital admission)   OB/GYN Status:  No LMP for male patient.  General Assessment Data Location of Assessment: [facility] ED TTS Assessment: In system Is this a Tele or Face-to-Face Assessment?: Face-to-Face Is this an Initial Assessment or a Re-assessment for this encounter?: Initial Assessment Patient Accompanied by:: N/A Language Other than English: No Living Arrangements: Other (Comment) What gender do you identify as?: Male Date Telepsych consult ordered in [facility]: 09/05/20 Time Telepsych consult ordered in [facility]: 2242 Marital status: Single Maiden name: n/a Pregnancy Status: No Living Arrangements: Alone Can pt return to current living arrangement?: Yes Admission Status: Involuntary Petitioner: Police Is patient capable of signing voluntary admission?: Yes Referral Source: Self/Family/Friend Insurance type: Medicaid [place]  Medical Screening Exam ([place]) Medical Exam completed: Yes  Crisis Care Plan Living Arrangements: Alone Legal Guardian: Other: (Self) [facility] of Psychiatrist: [facility] Name of Therapist: [facility]  Education Status Is patient  currently in school?: No Is the patient employed, unemployed or receiving disability?: Unemployed  Risk to self with the past 6 months Suicidal Ideation: Yes-Currently Present Has patient been a risk to self within the past 6 months prior to admission? : No Suicidal Intent: Yes-Currently Present Has patient had any suicidal intent within the past 6 months prior to admission? : Yes Is patient at risk for suicide?: Yes Suicidal Plan?: Yes-Currently Present Has patient had any suicidal plan within the past 6 months prior to admission? : Yes Specify Current Suicidal Plan: Shoot himself; Let the cancer take it's course Access to Means: Yes Specify Access to Suicidal Means: Pt has a fire arm in the home What has been your use of drugs/alcohol within the last 12 months?: Alcohol; cocaine Previous Attempts/Gestures: Yes How many times?: 3 Other Self Harm Risks: Pt has cancer diagnosis; hx of depression Triggers for Past Attempts: Unpredictable Intentional Self Injurious Behavior: None Family Suicide History: Unknown Recent stressful life event(s): Financial Problems, Turmoil (Comment), Conflict (Comment) Persecutory voices/beliefs?: No Depression: Yes Depression Symptoms: Tearfulness, Isolating, Fatigue, Feeling worthless/self pity, Loss of interest in usual pleasures, Feeling angry/irritable Substance abuse history and/or treatment for substance abuse?: Yes Suicide prevention information given to non-admitted patients: Not applicable  Risk to Others within the past 6 months Homicidal Ideation: No Does patient have any lifetime risk of violence toward others beyond the six months prior to admission? : No Thoughts of Harm to Others: No Current Homicidal Intent: No Current Homicidal Plan: No Access to Homicidal Means: No Identified Victim: n/a History of harm to others?: No Assessment of Violence: None Noted Violent Behavior Description: None noted Does patient have access to weapons?:  Yes (Comment) (Pt has access to a fire arm) Criminal Charges Pending?: No Does patient have a court date: No Is patient on probation?: No  Psychosis Hallucinations: None noted Delusions: None noted  Mental Status Report Appearance/Hygiene: Disheveled, Poor hygiene Eye Contact: Fair Motor Activity: Freedom of movement Speech: Logical/coherent Level of Consciousness: Alert Mood: Depressed, Anxious, Anhedonia, Despair, Helpless, Irritable, Worthless, low self-esteem Affect: Depressed, Irritable Anxiety Level: Moderate Thought Processes: Coherent, Relevant Judgement: Impaired Orientation: Person, Place, Situation, Time Obsessive Compulsive Thoughts/Behaviors: None  Cognitive Functioning Concentration: Normal Memory: Recent Intact, Remote Intact Is patient IDD: No Insight: Fair Impulse Control: Fair Appetite: Good Have you had any weight changes? : No Change Sleep: Decreased Total Hours of Sleep: 3 Vegetative Symptoms: Staying in bed, Decreased grooming  ADLScreening [facility] Assessment Services) Patient's cognitive ability adequate to safely complete daily activities?: Yes Patient able to express need for assistance with ADLs?: Yes Independently performs ADLs?: Yes (appropriate for developmental age)  Prior Inpatient Therapy Prior Inpatient Therapy: Yes Prior Therapy Dates: 12/2016, 10/2017, 10/2018 Prior Therapy Facilty/Provider(s): [facility] Reason for Treatment: Depression  Prior Outpatient Therapy Prior Outpatient Therapy: Yes Prior Therapy Dates:  (Unable to recall) Prior Therapy Facilty/Provider(s): [facility] Reason for Treatment: Peer support; depression Does patient have an ACCT team?: No Does patient have Intensive In-House Services?  : No Does patient have [facility] services? : No Does patient have P4CC services?: No  ADL Screening (condition at time of admission) Patient's cognitive ability adequate to safely complete daily activities?: Yes Is the patient deaf or  have difficulty hearing?: No Does the patient have difficulty seeing, even when wearing glasses/contacts?: No Does the patient have difficulty concentrating, remembering, or making decisions?: No Patient able to express need for assistance with ADLs?: Yes Does the patient have difficulty dressing or bathing?: No Independently performs ADLs?: Yes (appropriate for developmental age) Does the patient have difficulty walking or climbing stairs?: No Weakness of Legs: None Weakness of Arms/Hands: None  Home Assistive Devices/Equipment Home Assistive Devices/Equipment: None  Therapy Consults (therapy consults require a physician order) PT Evaluation Needed: No OT Evalulation Needed: No SLP Evaluation Needed: No   Values / Beliefs Cultural Requests During Hospitalization: None Spiritual Requests During Hospitalization: None Consults Spiritual Care Consult Needed: No Transition of Care Team Consult Needed: No Advance Directives (For Healthcare) Does Patient Have a Medical Advance Directive?: No Would patient like information on creating a medical advance directive?: No - Patient declined          Disposition: Per psych NP Corene Cornea, B., pt is recommended for inpatient treatment once medically cleared.  Disposition Initial Assessment Completed for this Encounter: Yes  On Site Evaluation by:   Reviewed with Physician:    Kathi Ludwig 09/06/2020 12:29 AM

## 2020-09-06 NOTE — Tx Team (Signed)
Initial Treatment Plan 09/06/2020 1:39 PM Ian [name] [id number]    PATIENT STRESSORS: Financial difficulties Marital or family conflict Medication change or noncompliance Substance abuse   PATIENT STRENGTHS: Ability for insight Average or above average intelligence Communication skills Physical Health   PATIENT IDENTIFIED PROBLEMS: Anxiety  Depression  Loneliness   Financial stress                DISCHARGE CRITERIA:  Adequate post-discharge living arrangements Medical problems require only outpatient monitoring Motivation to continue treatment in a less acute level of care Verbal commitment to aftercare and medication compliance  PRELIMINARY DISCHARGE PLAN: Attend aftercare/continuing care group Return to previous living arrangement  PATIENT/FAMILY INVOLVEMENT: This treatment plan has been presented to and reviewed with the patient, Ian [name], and/or family member,Anxiety.  The patient and family have been given the opportunity to ask questions and make suggestions.  Merlene Morse, RN 09/06/2020, 1:39 PM

## 2020-09-06 NOTE — ED Notes (Signed)
Pt provided with breakfast tray.

## 2020-09-06 NOTE — Plan of Care (Signed)
Patient new to the unit today, hasn't had time to progress  Problem: Education: Goal: Knowledge of [facility] Education information/materials will improve Outcome: Not Progressing   Problem: Health Behavior/Discharge Planning: Goal: Identification of resources available to assist in meeting health care needs will improve Outcome: Not Progressing Goal: Compliance with treatment plan for underlying cause of condition will improve Outcome: Not Progressing   Problem: Physical Regulation: Goal: Ability to maintain clinical measurements within normal limits will improve Outcome: Not Progressing   Problem: Safety: Goal: Periods of time without injury will increase Outcome: Not Progressing   Problem: Education: Goal: Utilization of techniques to improve thought processes will improve Outcome: Not Progressing Goal: Knowledge of the prescribed therapeutic regimen will improve Outcome: Not Progressing

## 2020-09-06 NOTE — Progress Notes (Signed)
Patient admitted from ED with major depression.Patient appears very anxious and depressed but cooperative during admission assessment.Patient states that he feels hopeless,financial stress and no support in his life. Patient denies SI/HI at this time. Patient denies AVH. Patient informed of fall risk status, fall risk assessed "moderate" at this time since patient claims that he had multiple falls at home. Patient oriented to unit/staff/room. Patient denies any questions/concerns at this time. Patient safe on unit with Q15 minute checks for safety. Skin assessment and body search done.No contraband found.

## 2020-09-07 MED ORDER — MENTHOL 3 MG MT LOZG
1.0000 | LOZENGE | OROMUCOSAL | Status: DC | PRN
  Administered 2020-09-07: 3 mg via ORAL
  Filled 2020-09-07 (×2): qty 9

## 2020-09-07 MED ORDER — IBUPROFEN 600 MG PO TABS
600.0000 mg | ORAL_TABLET | Freq: Four times a day (QID) | ORAL | Status: DC | PRN
  Administered 2020-09-09 – 2020-09-21 (×15): 600 mg via ORAL
  Filled 2020-09-07 (×16): qty 1

## 2020-09-07 NOTE — Progress Notes (Signed)
[facility] MD Progress Note  09/07/2020 3:52 PM Ian [name]  MRN:  [id number] Subjective: Follow-up for this patient with recurrent severe depression and substance abuse.  Patient seen chart reviewed.  He says he woke up today tired and feeling sick with a sore throat.  Once he is up and moving around it is a little bit better.  He does not have a fever and he does not feel otherwise constitutionally ill and is not coughing or sneezing.  Mood is slightly better although still anxious.  Passive suicidal thought with no intent or plan. Principal Problem: Severe recurrent major depression (HCC) Diagnosis: Principal Problem:   Severe recurrent major depression ([place]) Active Problems:   HTN (hypertension)   Alcohol use disorder, moderate, dependence (HCC)   Cocaine use disorder, moderate, dependence (HCC)   Generalized anxiety disorder   COPD exacerbation ([place])  Total Time spent with patient: 30 minutes  Past Psychiatric History: Past history of multiple episodes of depression and substance abuse  Past Medical History:  Past Medical History:  Diagnosis Date  . Anxiety   . Barrett esophagus   . Cancer ([place])   . Coronary artery disease   . Depression   . GERD (gastroesophageal reflux disease)   . Hypertension   . Pre-diabetes   . Stroke [facility]) 2014   "mini-stroke" per patient    Past Surgical History:  Procedure Laterality Date  . ANGIOPLASTY    . APPENDECTOMY    . CARDIAC CATHETERIZATION    . CHOLECYSTECTOMY    . WRIST SURGERY Right    age 42   Family History:  Family History  Problem Relation Age of Onset  . Stroke Father   . Leukemia Father   . Breast cancer Sister    Family Psychiatric  History: See previous Social History:  Social History   Substance and Sexual Activity  Alcohol Use Yes     Social History   Substance and Sexual Activity  Drug Use Yes  . Types: Cocaine, Marijuana    Social History   Socioeconomic History  . Marital status: Divorced    Spouse  name: Not on file  . Number of children: Not on file  . Years of education: Not on file  . Highest education level: Not on file  Occupational History  . Not on file  Tobacco Use  . Smoking status: Current Every Day Smoker    Packs/day: 1.00    Types: Cigarettes  . Smokeless tobacco: Never Used  Vaping Use  . Vaping Use: Never used  Substance and Sexual Activity  . Alcohol use: Yes  . Drug use: Yes    Types: Cocaine, Marijuana  . Sexual activity: Not on file  Other Topics Concern  . Not on file  Social History Narrative  . Not on file   Social Determinants of Health   Financial Resource Strain:   . Difficulty of Paying Living Expenses: Not on file  Food Insecurity:   . Worried About Charity fundraiser in the Last Year: Not on file  . Ran Out of Food in the Last Year: Not on file  Transportation Needs:   . Lack of Transportation (Medical): Not on file  . Lack of Transportation (Non-Medical): Not on file  Physical Activity:   . Days of Exercise per Week: Not on file  . Minutes of Exercise per Session: Not on file  Stress:   . Feeling of Stress : Not on file  Social Connections:   . Frequency  of Communication with Friends and Family: Not on file  . Frequency of Social Gatherings with Friends and Family: Not on file  . Attends Religious Services: Not on file  . Active Member of Clubs or Organizations: Not on file  . Attends Archivist Meetings: Not on file  . Marital Status: Not on file   Additional Social History:                         Sleep: Fair  Appetite:  Fair  Current Medications: Current Facility-Administered Medications  Medication Dose Route Frequency Provider Last Rate Last Admin  . alum & mag hydroxide-simeth (MAALOX/MYLANTA) 200-200-20 MG/5ML suspension 30 mL  30 mL Oral Q4H PRN Lindon Romp A, NP      . aspirin chewable tablet 81 mg  81 mg Oral Daily Olivya Sobol, [name], MD   81 mg at 09/07/20 1610  . atorvastatin (LIPITOR) tablet  40 mg  40 mg Oral Daily Shayle Donahoo, [name], MD   40 mg at 09/07/20 9604  . busPIRone (BUSPAR) tablet 15 mg  15 mg Oral TID Ollivander See, [name], MD   15 mg at 09/07/20 1208  . clonazePAM (KLONOPIN) tablet 0.5 mg  0.5 mg Oral BID PRN Radley Barto, [name], MD   0.5 mg at 09/07/20 1514  . escitalopram (LEXAPRO) tablet 20 mg  20 mg Oral Daily Mackenzi Krogh, [name], MD   20 mg at 09/07/20 5409  . hydrochlorothiazide (HYDRODIURIL) tablet 25 mg  25 mg Oral Daily Mitchel Delduca, [name], MD   25 mg at 09/07/20 8119  . ibuprofen (ADVIL) tablet 600 mg  600 mg Oral Q6H PRN Tonee Silverstein T, MD      . lisinopril (ZESTRIL) tablet 40 mg  40 mg Oral Daily Lindon Romp A, NP   40 mg at 09/07/20 0813  . LORazepam (ATIVAN) tablet 1 mg  1 mg Oral Q6H PRN Lindon Romp A, NP   1 mg at 09/06/20 2115  . magnesium hydroxide (MILK OF MAGNESIA) suspension 30 mL  30 mL Oral Daily PRN Lindon Romp A, NP      . menthol-cetylpyridinium (CEPACOL) lozenge 3 mg  1 lozenge Oral PRN Christabel Camire, [name], MD   3 mg at 09/07/20 1515  . metFORMIN (GLUCOPHAGE-XR) 24 hr tablet 500 mg  500 mg Oral Q breakfast Neelam Tiggs, [name], MD   500 mg at 09/07/20 1478  . mometasone-formoterol (DULERA) 200-5 MCG/ACT inhaler 2 puff  2 puff Inhalation BID Lindon Romp A, NP   2 puff at 09/07/20 0813  . nicotine (NICODERM CQ - dosed in mg/24 hours) patch 21 mg  21 mg Transdermal Daily Beckham Buxbaum, [name], MD   21 mg at 09/07/20 0815  . pantoprazole (PROTONIX) EC tablet 40 mg  40 mg Oral Daily Lasonja Lakins, [name], MD   40 mg at 09/07/20 2956  . QUEtiapine (SEROQUEL XR) 24 hr tablet 400 mg  400 mg Oral QHS Gredmarie Delange T, MD   400 mg at 09/06/20 2113  . zolpidem (AMBIEN) tablet 10 mg  10 mg Oral QHS PRN Paris Hohn, [name], MD   10 mg at 09/06/20 2113    Lab Results:  Results for orders placed or performed during the hospital encounter of 09/05/20 (from the past 48 hour(s))  Comprehensive metabolic panel     Status: Abnormal   Collection Time: 09/05/20 10:20 PM  Result Value Ref Range   Sodium  140 135 - 145 mmol/L  Potassium 3.8 3.5 - 5.1 mmol/L   Chloride 101 98 - 111 mmol/L   CO2 25 22 - 32 mmol/L   Glucose, Bld 195 (H) 70 - 99 mg/dL    Comment: Glucose reference range applies only to samples taken after fasting for at least 8 hours.   BUN 10 6 - 20 mg/dL   Creatinine, Ser 0.85 0.61 - 1.24 mg/dL   Calcium 9.1 8.9 - 10.3 mg/dL   Total Protein 7.3 6.5 - 8.1 g/dL   Albumin 3.9 3.5 - 5.0 g/dL   AST 19 15 - 41 U/L   ALT 24 0 - 44 U/L   Alkaline Phosphatase 66 38 - 126 U/L   Total Bilirubin 0.4 0.3 - 1.2 mg/dL   GFR calc non Af Amer >60 >60 mL/min   GFR calc Af Amer >60 >60 mL/min   Anion gap 14 5 - 15    Comment: Performed at [facility], [address], [place], [place] [address]  Ethanol     Status: Abnormal   Collection Time: 09/05/20 10:20 PM  Result Value Ref Range   Alcohol, Ethyl (B) 137 (H) <10 mg/dL    Comment: (NOTE) Lowest detectable limit for serum alcohol is 10 mg/dL.  For medical purposes only. Performed at [facility], [address] [place], [place] [address]   Salicylate level     Status: Abnormal   Collection Time: 09/05/20 10:20 PM  Result Value Ref Range   Salicylate Lvl <7.8 (L) 7.0 - 30.0 mg/dL    Comment: Performed at [facility], [address] [place], [place] [address]  Acetaminophen level     Status: Abnormal   Collection Time: 09/05/20 10:20 PM  Result Value Ref Range   Acetaminophen (Tylenol), Serum <10 (L) 10 - 30 ug/mL    Comment: (NOTE) Therapeutic concentrations vary significantly. A range of 10-30 ug/mL  may be an effective concentration for many patients. However, some  are best treated at concentrations outside of this range. Acetaminophen concentrations >150 ug/mL at 4 hours after ingestion  and >50 ug/mL at 12 hours after ingestion are often associated with  toxic reactions.  Performed at [facility], [address] [place], [place] [address]   cbc     Status: Abnormal    Collection Time: 09/05/20 10:20 PM  Result Value Ref Range   WBC 16.3 (H) 4.0 - 10.5 K/uL   RBC 4.90 4.22 - 5.81 MIL/uL   Hemoglobin 14.5 13.0 - 17.0 g/dL   HCT 44.3 39 - 52 %   MCV 90.4 80.0 - 100.0 fL   MCH 29.6 26.0 - 34.0 pg   MCHC 32.7 30.0 - 36.0 g/dL   RDW 12.9 11.5 - 15.5 %   Platelets 240 150 - 400 K/uL   nRBC 0.0 0.0 - 0.2 %    Comment: Performed at [facility], [address], [place], [place] [address]  Brain natriuretic peptide     Status: None   Collection Time: 09/05/20 10:20 PM  Result Value Ref Range   B Natriuretic Peptide 25.6 0.0 - 100.0 pg/mL    Comment: Performed at [facility], [address] [place], [place] [address]  Procalcitonin - Baseline     Status: None   Collection Time: 09/05/20 10:20 PM  Result Value Ref Range   Procalcitonin <0.10 ng/mL    Comment:        Interpretation: PCT (Procalcitonin) <= 0.5 ng/mL: Systemic infection (sepsis) is not likely.  Local bacterial infection is possible. (NOTE)       Sepsis PCT Algorithm           Lower Respiratory Tract                                      Infection PCT Algorithm    ----------------------------     ----------------------------         PCT < 0.25 ng/mL                PCT < 0.10 ng/mL          Strongly encourage             Strongly discourage   discontinuation of antibiotics    initiation of antibiotics    ----------------------------     -----------------------------       PCT 0.25 - 0.50 ng/mL            PCT 0.10 - 0.25 ng/mL               OR       >80% decrease in PCT            Discourage initiation of                                            antibiotics      Encourage discontinuation           of antibiotics    ----------------------------     -----------------------------         PCT >= 0.50 ng/mL              PCT 0.26 - 0.50 ng/mL               AND        <80% decrease in PCT             Encourage initiation of                                              antibiotics       Encourage continuation           of antibiotics    ----------------------------     -----------------------------        PCT >= 0.50 ng/mL                  PCT > 0.50 ng/mL               AND         increase in PCT                  Strongly encourage                                      initiation of antibiotics    Strongly encourage escalation           of antibiotics                                     -----------------------------  PCT <= 0.25 ng/mL                                                 OR                                        > 80% decrease in PCT                                      Discontinue / Do not initiate                                             antibiotics  Performed at [facility], [address] [place], [place] [address]   SARS Coronavirus 2 by RT PCR (hospital order, performed in [facility] hospital lab) Nasopharyngeal Nasopharyngeal Swab     Status: None   Collection Time: 09/06/20 12:44 AM   Specimen: Nasopharyngeal Swab  Result Value Ref Range   SARS Coronavirus 2 NEGATIVE NEGATIVE    Comment: (NOTE) SARS-CoV-2 target nucleic acids are NOT DETECTED.  The SARS-CoV-2 RNA is generally detectable in upper and lower respiratory specimens during the acute phase of infection. The lowest concentration of SARS-CoV-2 viral copies this assay can detect is 250 copies / mL. A negative result does not preclude SARS-CoV-2 infection and should not be used as the sole basis for treatment or other patient management decisions.  A negative result may occur with improper specimen collection / handling, submission of specimen other than nasopharyngeal swab, presence of viral mutation(s) within the areas targeted by this assay, and inadequate number of viral copies (<250 copies / mL). A negative result must be combined with clinical observations, patient history, and epidemiological  information.  Fact Sheet for Patients:   StrictlyIdeas.no  Fact Sheet for Healthcare Providers: BankingDealers.co.za  This test is not yet approved or  cleared by the [place] FDA and has been authorized for detection and/or diagnosis of SARS-CoV-2 by FDA under an Emergency Use Authorization (EUA).  This EUA will remain in effect (meaning this test can be used) for the duration of the COVID-19 declaration under Section 564(b)(1) of the Act, 21 U.S.C. section 360bbb-3(b)(1), unless the authorization is terminated or revoked sooner.  Performed at [facility], [address] [place], [place] [address]     Blood Alcohol level:  Lab Results  Component Value Date   ETH 137 (H) 09/05/2020   ETH <10 71/05/2693    Metabolic Disorder Labs: Lab Results  Component Value Date   HGBA1C 7.4 (H) 08/26/2019   MPG 165.68 08/26/2019   MPG 136.98 12/13/2018   No results found for: PROLACTIN Lab Results  Component Value Date   CHOL 178 08/26/2019   TRIG 375 (H) 08/26/2019   HDL 23 (L) 08/26/2019   CHOLHDL 7.7 08/26/2019   VLDL 75 (H) 08/26/2019   LDLCALC 80 08/26/2019   LDLCALC 70 12/13/2018    Physical Findings: AIMS:  , ,  ,  ,    CIWA:  CIWA-Ar Total: 7 COWS:  Musculoskeletal: Strength & Muscle Tone: within normal limits Gait & Station: normal Patient leans: N/A  Psychiatric Specialty Exam: Physical Exam Vitals and nursing note reviewed.  Constitutional:      Appearance: He is well-developed.  HENT:     Head: Normocephalic and atraumatic.  Eyes:     Conjunctiva/sclera: Conjunctivae normal.     Pupils: Pupils are equal, round, and reactive to light.  Cardiovascular:     Heart sounds: Normal heart sounds.  Pulmonary:     Effort: Pulmonary effort is normal.  Abdominal:     Palpations: Abdomen is soft.  Musculoskeletal:        General: Normal range of motion.     Cervical back: Normal range of  motion.  Skin:    General: Skin is warm and dry.  Neurological:     General: No focal deficit present.     Mental Status: He is alert.  Psychiatric:        Attention and Perception: Attention normal.        Mood and Affect: Mood is anxious.        Speech: Speech normal.        Behavior: Behavior normal.        Thought Content: Thought content normal. Thought content does not include suicidal plan.        Cognition and Memory: Cognition normal.        Judgment: Judgment normal.     Review of Systems  Constitutional: Positive for fatigue.  HENT: Positive for sore throat.   Eyes: Negative.   Respiratory: Negative.   Cardiovascular: Negative.   Gastrointestinal: Negative.   Musculoskeletal: Negative.   Skin: Negative.   Neurological: Negative.   Psychiatric/Behavioral: Positive for dysphoric mood. Negative for suicidal ideas. The patient is nervous/anxious.     Blood pressure (!) 145/104, pulse (!) 102, temperature 97.9 F (36.6 C), temperature source Oral, resp. rate 18, SpO2 97 %.There is no height or weight on file to calculate BMI.  General Appearance: Casual  Eye Contact:  Fair  Speech:  Clear and Coherent  Volume:  Normal  Mood:  Depressed  Affect:  Constricted  Thought Process:  Coherent  Orientation:  Full (Time, Place, and Person)  Thought Content:  Logical  Suicidal Thoughts:  Yes.  without intent/plan  Homicidal Thoughts:  No  Memory:  Immediate;   Fair Recent;   Fair Remote;   Fair  Judgement:  Fair  Insight:  Fair  Psychomotor Activity:  Normal  Concentration:  Concentration: Fair  Recall:  [employer] of Knowledge:  Fair  Language:  Fair  Akathisia:  No  Handed:  Right  AIMS (if indicated):     Assets:  Desire for Improvement  ADL's:  Impaired  Cognition:  WNL and Impaired,  Mild  Sleep:  Number of Hours: 7     Treatment Plan Summary: Daily contact with patient to assess and evaluate symptoms and progress in treatment, Medication management and  Plan He looks like he is feeling better today.  Still nervous but not as desperate as yesterday.  Suicidal thoughts without intent or plan.  Expressing more optimism.  Complaining of a sore throat but not otherwise obviously sick I have reviewed his systems with him and asked him to please keep nursing staff updated if he is feeling worse.  Lozenges ordered.  Alethia Berthold, MD 09/07/2020, 3:52 PM
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
## 2020-09-07 NOTE — Progress Notes (Signed)
Recreation Therapy Notes   Date: 09/07/2020  Time: 9:30 am   Location: Craft room     Behavioral response: N/A   Intervention Topic: Self-Care   Discussion/Intervention: Patient did not attend group.   Clinical Observations/Feedback:  Patient did not attend group.   Mikayla Chiusano LRT/CTRS        Talal Fritchman 09/07/2020 11:22 AM

## 2020-09-07 NOTE — Progress Notes (Signed)
D: Pt alert and oriented. Pt rates depression 5/10, hopelessness 5/10, and anxiety 5/10. Pt reports energy level as low and concentration as being good. Pt reports sleep last night as being good. Pt did receive medications for sleep and did find them helpful. Pt reports experiencing 8/10 chronic back pain, prn pain management meds offer. Pt stated he didn't want anything at this time. Pt denies experiencing any SI/HI, or AVH at this time.   A: Scheduled medications administered to pt, per MD orders. Support and encouragement provided. Frequent verbal contact made. Routine safety checks conducted q15 minutes.   R: No adverse drug reactions noted. Pt verbally contracts for safety at this time. Pt complaint with medications. Pt interacts well with others on the unit minimally. Pt remains safe at this time. Will continue to monitor.   Pt had a c/o a sore throat. Temp taken, no fever, no other symptoms. Throat lozengers orders and found helpful. Pt does snore and was breathing through his mouth when observed sleeping.

## 2020-09-07 NOTE — BHH Suicide Risk Assessment (Signed)
[place] INPATIENT:  Family/Significant Other Suicide Prevention Education  Suicide Prevention Education:  Education Completed; Liana Gerold, friend, [phone] has been identified by the patient as the family member/significant other with whom the patient will be residing, and identified as the person(s) who will aid the patient in the event of a mental health crisis (suicidal ideations/suicide attempt).  With written consent from the patient, the family member/significant other has been provided the following suicide prevention education, prior to the and/or following the discharge of the patient.  The suicide prevention education provided includes the following:  Suicide risk factors  Suicide prevention and interventions  National Suicide Hotline telephone number  [facility] assessment telephone number  [facility] Emergency Assistance [place] and/or Residential Mobile Crisis Unit telephone number  Request made of family/significant other to:  Remove weapons (e.g., guns, rifles, knives), all items previously/currently identified as safety concern.    Remove drugs/medications (over-the-counter, prescriptions, illicit drugs), all items previously/currently identified as a safety concern.  The family member/significant other verbalizes understanding of the suicide prevention education information provided.  The family member/significant other agrees to remove the items of safety concern listed above.  CSW spoke with the patient's peer. Per the peer, the patient is not a danger to self or others.  He reports that he has known the pt for over 20 years. He reports that he does not thinks that the patient has access to any firearms or weapons.  CSW notes that the patient reported earlier that he does have a firearn and it is hidden in his home for no one else to find but him.    Rozann Lesches 09/07/2020, 4:06 PM

## 2020-09-08 NOTE — Plan of Care (Signed)
Pt was observed in the milieu watching TV. He presents pleasant, denied any concerns and reports that his sore throat has improved. He denied SI/HI/AVH or self harm and he is med compliant. Pt denied w/d sx r/t ETOH intake/ He received PRN Ambien for sleep with good effect as he is currently resting in bed with + even and unlabored respirations. No falls or unsafe behavior noted. Q15 minutes observations maintained for safety and support provided as needed.  Problem: Education: Goal: Knowledge of [place] [facility] Education information/materials will improve Outcome: Progressing   Problem: Health Behavior/Discharge Planning: Goal: Compliance with treatment plan for underlying cause of condition will improve Outcome: Progressing   Problem: Safety: Goal: Periods of time without injury will increase Outcome: Progressing   Problem: Education: Goal: Utilization of techniques to improve thought processes will improve Outcome: Progressing   Problem: Coping: Goal: Coping ability will improve Outcome: Progressing Goal: Will verbalize feelings Outcome: Progressing   Problem: Self-Concept: Goal: Level of anxiety will decrease Outcome: Progressing

## 2020-09-08 NOTE — Progress Notes (Signed)
D Alert and Oriented  X 3 Presents Depressed and anxious mood with flat affect. Prn Klonopin for anxiety given at [phone]. Patient c/o nausea and vomiting after lunch. Seen by Provider on unit. Ciwa score of 11 Prn ativan 1mg  given and Maalox for indigestion.  A Scheduled medications administered per Provider order and prn effective. Support and encouragement provided. Routine safety checks conducted every 15 minutes. Patient notified to inform staff with problems or concerns.  R. No adverse drug reactions noted. Patient contracts for safety at this time. Will continue to monitor.

## 2020-09-08 NOTE — Progress Notes (Signed)
   09/08/20 1444  COVID-19 Daily Checkoff  Have you had a fever (temp > 37.80C/100F)  in the past 24 hours?  No  If you have had runny nose, nasal congestion, sneezing in the past 24 hours, has it worsened? No  COVID-19 EXPOSURE  Have you traveled outside the state in the past 14 days? No  Have you been in contact with someone with a confirmed diagnosis of COVID-19 or PUI in the past 14 days without wearing appropriate PPE? No  Have you been living in the same home as a person with confirmed diagnosis of COVID-19 or a PUI (household contact)? No  Have you been diagnosed with COVID-19? No

## 2020-09-08 NOTE — BHH Group Notes (Signed)
[place] Group Notes:  (Nursing/MHT/Case Management/Adjunct)  Date:  09/08/2020  Time:  11:03 PM  Type of Therapy:  Group Therapy  Participation Level:  Active  Participation Quality:  Appropriate  Affect:  Appropriate  Cognitive:  Alert  Insight:  Good  Engagement in Group:  Engaged and still working battling things.  Modes of Intervention:  Support  Summary of Progress/Problems:  Ian [name] 09/08/2020, 11:03 PM

## 2020-09-08 NOTE — BHH Group Notes (Signed)
[place] Group Notes: (Clinical Social Work)   09/08/2020      Type of Therapy:  Group Therapy   Participation Level:  Did Not Attend - was invited individually by Nurse/MHT and chose not to attend.   Raina Mina, LCSWA 09/08/2020  5:14 PM

## 2020-09-08 NOTE — Progress Notes (Signed)
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 male that has a previous psychiatric history of depression who presents to the [facility] unit for treatment of depression and suicidal ideations.   Interval History  Patient was seen today for re-evaluation.  Nursing reports no events overnight. The patient reports no issues with performing ADLs.  Patient has been medication compliant.  The patient reports no side effects from medications.    SUBJECTIVE: On assessment patient reports feeling depressed and expresses passive suicidal ideations in settings of his medical conditions. He is concerned about his GI symptoms. He reports that his sore throat are better after he started lozenges, although he had two episodes of food regurgitation today, almost vomiting. Denies any physical complaints at the time of the interview, denies nausea. He does not have a fever and is not coughing or sneezing. Reports that he had similar symptoms in the past, states he saw his GI doctor a month ago, had test (gastroscopy) and no abvnormalities were found. We discussed that he should eat small pieces and chew them well prior to swallowing. Patient agreed. Was observed by me later eating in the community room without obvious difficulties.   Review Of Systems: A complete review of systems of the following systems was conducted (Constitutional, Psychiatric, Neurological, Musculoskeletal, Eyes, Gastrointestinal, Cardiovascular, Respiratory, Skin, and Endocrine). All reviewed systems are negative except  pertinent positives identified in the subjective part.   Past Psychiatric History: see H&P   Past Medical History:  Past Medical History:  Diagnosis Date  . Anxiety   . Barrett esophagus   . Cancer ([place])   . Coronary artery disease   . Depression   . GERD (gastroesophageal reflux disease)   . Hypertension   . Pre-diabetes   . Stroke [facility]) 2014   "mini-stroke" per patient    Past Surgical History:  Procedure Laterality Date  . ANGIOPLASTY    . APPENDECTOMY    . CARDIAC CATHETERIZATION    . CHOLECYSTECTOMY    . WRIST SURGERY Right    age 12   Family History:  Family History  Problem Relation Age of Onset  . Stroke Father   . Leukemia Father   . Breast cancer Sister    Family Psychiatric  History: see H&P  Social History:  Social History   Substance and Sexual Activity  Alcohol Use Yes     Social History   Substance and Sexual Activity  Drug Use Yes  . Types: Cocaine, Marijuana    Social History   Socioeconomic History  . Marital status: Divorced    Spouse name: Not on file  . Number of children: Not on file  . Years of education: Not on file  . Highest education level: Not on file  Occupational History  . Not on file  Tobacco Use  . Smoking status: Current Every Day Smoker    Packs/day: 1.00    Types: Cigarettes  . Smokeless tobacco: Never Used  Vaping Use  . Vaping Use: Never used  Substance and Sexual Activity  . Alcohol use: Yes  . Drug use:  Yes    Types: Cocaine, Marijuana  . Sexual activity: Not on file  Other Topics Concern  . Not on file  Social History Narrative  . Not on file   Social Determinants of Health   Financial Resource Strain:   . Difficulty of Paying Living Expenses: Not on file  Food Insecurity:   . Worried About Charity fundraiser in the Last Year: Not on file  . Ran Out of Food in the Last Year: Not on file  Transportation Needs:   . Lack of Transportation (Medical): Not on file  . Lack of Transportation  (Non-Medical): Not on file  Physical Activity:   . Days of Exercise per Week: Not on file  . Minutes of Exercise per Session: Not on file  Stress:   . Feeling of Stress : Not on file  Social Connections:   . Frequency of Communication with Friends and Family: Not on file  . Frequency of Social Gatherings with Friends and Family: Not on file  . Attends Religious Services: Not on file  . Active Member of Clubs or Organizations: Not on file  . Attends Archivist Meetings: Not on file  . Marital Status: Not on file   Additional Social History:                         Sleep: Good  Appetite:  Fair  Current Medications: Current Facility-Administered Medications  Medication Dose Route Frequency Provider Last Rate Last Admin  . alum & mag hydroxide-simeth (MAALOX/MYLANTA) 200-200-20 MG/5ML suspension 30 mL  30 mL Oral Q4H PRN Lindon Romp A, NP   30 mL at 09/08/20 1232  . aspirin chewable tablet 81 mg  81 mg Oral Daily [name], [name], MD   81 mg at 09/08/20 0853  . atorvastatin (LIPITOR) tablet 40 mg  40 mg Oral Daily [name], [name], MD   40 mg at 09/08/20 0852  . busPIRone (BUSPAR) tablet 15 mg  15 mg Oral TID [name], [name], MD   15 mg at 09/08/20 1227  . clonazePAM (KLONOPIN) tablet 0.5 mg  0.5 mg Oral BID PRN [name], [name], MD   0.5 mg at 09/08/20 0852  . escitalopram (LEXAPRO) tablet 20 mg  20 mg Oral Daily [name], [name], MD   20 mg at 09/08/20 0851  . hydrochlorothiazide (HYDRODIURIL) tablet 25 mg  25 mg Oral Daily [name], [name], MD   25 mg at 09/08/20 0853  . ibuprofen (ADVIL) tablet 600 mg  600 mg Oral Q6H PRN [name], [name], MD      . lisinopril (ZESTRIL) tablet 40 mg  40 mg Oral Daily Lindon Romp A, NP   40 mg at 09/08/20 0851  . LORazepam (ATIVAN) tablet 1 mg  1 mg Oral Q6H PRN Lindon Romp A, NP   1 mg at 09/08/20 1232  . magnesium hydroxide (MILK OF MAGNESIA) suspension 30 mL  30 mL Oral Daily PRN Lindon Romp A, NP      . menthol-cetylpyridinium  (CEPACOL) lozenge 3 mg  1 lozenge Oral PRN [name], [name], MD   3 mg at 09/07/20 1515  . metFORMIN (GLUCOPHAGE-XR) 24 hr tablet 500 mg  500 mg Oral Q breakfast [name], [name], MD   500 mg at 09/08/20 0853  . mometasone-formoterol (DULERA) 200-5 MCG/ACT inhaler 2 puff  2 puff Inhalation BID Lindon Romp A, NP   2 puff at 09/08/20 0856  . nicotine (NICODERM  CQ - dosed in mg/24 hours) patch 21 mg  21 mg Transdermal Daily [name], [name], MD   21 mg at 09/08/20 0849  . pantoprazole (PROTONIX) EC tablet 40 mg  40 mg Oral Daily [name], [name], MD   40 mg at 09/08/20 0853  . QUEtiapine (SEROQUEL XR) 24 hr tablet 400 mg  400 mg Oral QHS [name], [name], MD   400 mg at 09/07/20 2135  . zolpidem (AMBIEN) tablet 10 mg  10 mg Oral QHS PRN [name], [name], MD   10 mg at 09/07/20 2134    Lab Results: No results found for this or any previous visit (from the past 48 hour(s)).  Blood Alcohol level:  Lab Results  Component Value Date   ETH 137 (H) 09/05/2020   ETH <10 96/28/3662    Metabolic Disorder Labs: Lab Results  Component Value Date   HGBA1C 7.4 (H) 08/26/2019   MPG 165.68 08/26/2019   MPG 136.98 12/13/2018   No results found for: PROLACTIN Lab Results  Component Value Date   CHOL 178 08/26/2019   TRIG 375 (H) 08/26/2019   HDL 23 (L) 08/26/2019   CHOLHDL 7.7 08/26/2019   VLDL 75 (H) 08/26/2019   LDLCALC 80 08/26/2019   LDLCALC 70 12/13/2018    Physical Findings: AIMS:  , ,  ,  ,    CIWA:  CIWA-Ar Total: 11 COWS:     Musculoskeletal: Strength & Muscle Tone: within normal limits Gait & Station: normal Patient leans: N/A  Psychiatric Specialty Exam: Physical Exam  Review of Systems  Constitutional: Negative for chills and fever.  HENT: Positive for sore throat.   Gastrointestinal: Positive for vomiting. Negative for abdominal pain and nausea.    Blood pressure (!) 119/106, pulse (!) 102, temperature 97.8 F (36.6 C), temperature source Oral, resp. rate 18, SpO2 96 %.There  is no height or weight on file to calculate BMI.  General Appearance: Casual  Eye Contact:  Fair  Speech:  Normal Rate  Volume:  Decreased  Mood:  Depressed and Dysphoric  Affect:  Congruent and Constricted  Thought Process:  Coherent and Goal Directed  Orientation:  Full (Time, Place, and Person)  Thought Content:  Logical  Suicidal Thoughts:  Yes.  without intent/plan  Homicidal Thoughts:  No  Memory:  Immediate;   Fair Recent;   Fair  Judgement:  Fair  Insight:  Fair  Psychomotor Activity:  Normal  Concentration:  Concentration: Fair  Recall:  [employer] of Knowledge:  Fair  Language:  Fair  Akathisia:  No  Handed:  Right  AIMS (if indicated):     Assets:  Desire for Improvement  ADL's:  Intact  Cognition:  WNL  Sleep:  Number of Hours: 8     Treatment Plan Summary: Daily contact with patient to assess and evaluate symptoms and progress in treatment and Medication management   Patient is a 59 year old male with the above-stated past psychiatric history who is seen in follow-up.  Chart reviewed. Patient discussed with nursing. Reports depressed mood and passive suicidal thoughts in settings of medical conditions. He is anxious and reports some GI symptoms (two episodes of food regurgitation) today, no other physical complaints. No objective signs of distress. Will continue current medications.    Plan:  -continue inpatient psych admission; 15-minute checks; daily contact with patient to assess and evaluate symptoms and progress in treatment; psychoeducation.  -continue scheduled  medications: . aspirin  81 mg Oral Daily  . atorvastatin  40 mg Oral Daily  . busPIRone  15 mg Oral TID  . escitalopram  20 mg Oral Daily  . hydrochlorothiazide  25 mg Oral Daily  . lisinopril  40 mg Oral Daily  . metFORMIN  500 mg Oral Q breakfast  . mometasone-formoterol  2 puff Inhalation BID  . nicotine  21 mg Transdermal Daily  . pantoprazole  40 mg Oral Daily  . QUEtiapine  400 mg  Oral QHS   -continue PRN medications.  alum & mag hydroxide-simeth, clonazePAM, ibuprofen, LORazepam, magnesium hydroxide, menthol-cetylpyridinium, zolpidem   -Pertinent Labs: no new labs ordered today  -EKG: on 9/15 showed Qtc 447ms.  -Covid-test negative.    -Consults: No new consults placed since yesterday    -Disposition: Patient is not ready for discharge yet. All necessary aftercare will be arranged prior to discharge Likely d/c home with outpatient psych follow-up.  -  I certify that the patient does need, on a daily basis, active treatment furnished directly by or requiring the supervision of inpatient psychiatric facility personnel.  For inpatient care, physician will direct treatment team plan within three days of admission and at least weekly thereafter.    Larita Fife, MD 09/08/2020, 2:36 PM

## 2020-09-09 NOTE — BHH Group Notes (Signed)
[place] Group Notes: (Clinical Social Work)   09/09/2020      Type of Therapy:  Group Therapy   Participation Level:  Did Not Attend - was invited individually by Nurse/MHT and chose not to attend.   Raina Mina, [place] 09/09/2020  3:12 PM

## 2020-09-09 NOTE — BHH Counselor (Addendum)
CSW attempted to have patients treatment team meeting with Dr. Danella Sensing. Patient was asleep.

## 2020-09-09 NOTE — Plan of Care (Signed)
  Problem: Education: Goal: Knowledge of [place] [facility] Education information/materials will improve Outcome: Progressing   Problem: Health Behavior/Discharge Planning: Goal: Identification of resources available to assist in meeting health care needs will improve Outcome: Progressing Goal: Compliance with treatment plan for underlying cause of condition will improve Outcome: Progressing   Problem: Physical Regulation: Goal: Ability to maintain clinical measurements within normal limits will improve Outcome: Progressing

## 2020-09-09 NOTE — Progress Notes (Signed)
[facility] MD Progress Note  09/09/2020 11:03 AM Ian [name]  MRN:  [id number]  Principal Problem: Severe recurrent major depression ([place]) Diagnosis: Principal Problem:   Severe recurrent major depression ([place]) Active Problems:   HTN (hypertension)   Alcohol use disorder, moderate, dependence (HCC)   Cocaine use disorder, moderate, dependence ([place])   Generalized anxiety disorder   COPD exacerbation ([place])  Total Time spent with patient: 20 minutes  Ian [name] is a  59y.o. male that has a previous psychiatric history of depression who presents to the [facility] unit for treatment of depression and suicidal ideations.   Interval History Patient was seen today for re-evaluation.  Nursing reports no events overnight. The patient reports no issues with performing ADLs.  Patient has been medication compliant.  The patient reports no side effects from medications.    SUBJECTIVE: On assessment patient reports "feeling better, less depressed". He denies suicidal ideations. Denies withdrawal symptoms. He is still concerned about his yesterday`s GI symptoms, although reports no vomiting/regurgitation today. He reports that his sore throat improved as well. Denies any physical complaints at the time of the interview. He does not have a fever and is not coughing or sneezing. His vitals are WNL.  Last CIWA score was 0.  Past Psychiatric History: see H&P   Past Medical History:  Past Medical History:  Diagnosis Date   Anxiety    Barrett esophagus    Cancer ([place])    Coronary artery disease    Depression    GERD (gastroesophageal reflux disease)    Hypertension    Pre-diabetes    Stroke ([place]) 2014   "mini-stroke" per patient    Past Surgical History:  Procedure Laterality Date   ANGIOPLASTY     APPENDECTOMY     CARDIAC CATHETERIZATION     CHOLECYSTECTOMY     WRIST SURGERY Right    age 55   Family History:  Family History  Problem Relation Age of Onset   Stroke Father     Leukemia Father    Breast cancer Sister    Family Psychiatric  History: see H&P  Social History:  Social History   Substance and Sexual Activity  Alcohol Use Yes     Social History   Substance and Sexual Activity  Drug Use Yes   Types: Cocaine, Marijuana    Social History   Socioeconomic History   Marital status: Divorced    Spouse name: Not on file   Number of children: Not on file   Years of education: Not on file   Highest education level: Not on file  Occupational History   Not on file  Tobacco Use   Smoking status: Current Every Day Smoker    Packs/day: 1.00    Types: Cigarettes   Smokeless tobacco: Never Used  Vaping Use   Vaping Use: Never used  Substance and Sexual Activity   Alcohol use: Yes   Drug use: Yes    Types: Cocaine, Marijuana   Sexual activity: Not on file  Other Topics Concern   Not on file  Social History Narrative   Not on file   Social Determinants of Health   Financial Resource Strain:    Difficulty of Paying Living Expenses: Not on file  Food Insecurity:    Worried About Charity fundraiser in the Last Year: Not on file   [employer] of Food in the Last Year: Not on file  Transportation Needs:    Lack of Transportation (Medical): Not on file  Lack of Transportation (Non-Medical): Not on file  Physical Activity:    Days of Exercise per Week: Not on file   Minutes of Exercise per Session: Not on file  Stress:    Feeling of Stress : Not on file  Social Connections:    Frequency of Communication with Friends and Family: Not on file   Frequency of Social Gatherings with Friends and Family: Not on file   Attends Religious Services: Not on file   Active Member of Clubs or Organizations: Not on file   Attends Archivist Meetings: Not on file   Marital Status: Not on file   Additional Social History:                         Sleep: Good  Appetite:  Fair  Current Medications: Current  Facility-Administered Medications  Medication Dose Route Frequency Provider Last Rate Last Admin   alum & mag hydroxide-simeth (MAALOX/MYLANTA) 200-200-20 MG/5ML suspension 30 mL  30 mL Oral Q4H PRN Lindon Romp A, NP   30 mL at 09/08/20 1232   aspirin chewable tablet 81 mg  81 mg Oral Daily [name], [name], MD   81 mg at 09/09/20 0910   atorvastatin (LIPITOR) tablet 40 mg  40 mg Oral Daily [name], [name], MD   40 mg at 09/09/20 0909   busPIRone (BUSPAR) tablet 15 mg  15 mg Oral TID [name], [name], MD   15 mg at 09/09/20 0910   clonazePAM (KLONOPIN) tablet 0.5 mg  0.5 mg Oral BID PRN [name], [name], MD   0.5 mg at 09/08/20 0852   escitalopram (LEXAPRO) tablet 20 mg  20 mg Oral Daily [name], [name], MD   20 mg at 09/09/20 0910   hydrochlorothiazide (HYDRODIURIL) tablet 25 mg  25 mg Oral Daily [name], [name], MD   25 mg at 09/09/20 0910   ibuprofen (ADVIL) tablet 600 mg  600 mg Oral Q6H PRN [name], [name], MD       lisinopril (ZESTRIL) tablet 40 mg  40 mg Oral Daily Lindon Romp A, NP   40 mg at 09/09/20 0910   LORazepam (ATIVAN) tablet 1 mg  1 mg Oral Q6H PRN Lindon Romp A, NP   1 mg at 09/08/20 1232   magnesium hydroxide (MILK OF MAGNESIA) suspension 30 mL  30 mL Oral Daily PRN Lindon Romp A, NP       menthol-cetylpyridinium (CEPACOL) lozenge 3 mg  1 lozenge Oral PRN [name], [name], MD   3 mg at 09/07/20 1515   metFORMIN (GLUCOPHAGE-XR) 24 hr tablet 500 mg  500 mg Oral Q breakfast [name], [name], MD   500 mg at 09/09/20 0913   mometasone-formoterol (DULERA) 200-5 MCG/ACT inhaler 2 puff  2 puff Inhalation BID Lindon Romp A, NP   2 puff at 09/09/20 0915   nicotine (NICODERM CQ - dosed in mg/24 hours) patch 21 mg  21 mg Transdermal Daily [name], [name], MD   21 mg at 09/09/20 0914   pantoprazole (PROTONIX) EC tablet 40 mg  40 mg Oral Daily [name], [name], MD   40 mg at 09/09/20 0910   QUEtiapine (SEROQUEL XR) 24 hr tablet 400 mg  400 mg Oral QHS [name], [name], MD   400  mg at 09/08/20 2122   zolpidem (AMBIEN) tablet 10 mg  10 mg Oral QHS PRN [name], [name], MD   10 mg at 09/08/20 2125  Lab Results: No results found for this or any previous visit (from the past 48 hour(s)).  Blood Alcohol level:  Lab Results  Component Value Date   ETH 137 (H) 09/05/2020   ETH <10 64/40/3474    Metabolic Disorder Labs: Lab Results  Component Value Date   HGBA1C 7.4 (H) 08/26/2019   MPG 165.68 08/26/2019   MPG 136.98 12/13/2018   No results found for: PROLACTIN Lab Results  Component Value Date   CHOL 178 08/26/2019   TRIG 375 (H) 08/26/2019   HDL 23 (L) 08/26/2019   CHOLHDL 7.7 08/26/2019   VLDL 75 (H) 08/26/2019   LDLCALC 80 08/26/2019   LDLCALC 70 12/13/2018    Physical Findings: AIMS:  , ,  ,  ,    CIWA:  CIWA-Ar Total: 0 COWS:     Musculoskeletal: Strength & Muscle Tone: within normal limits Gait & Station: normal Patient leans: N/A  Psychiatric Specialty Exam:     Blood pressure 126/86, pulse 95, temperature 97.6 F (36.4 C), temperature source Oral, resp. rate 18, SpO2 97 %.There is no height or weight on file to calculate BMI.    General Appearance: Casual  Eye Contact:  Fair  Speech:  Normal Rate  Volume:  Decreased  Mood:  Depressed and Dysphoric  Affect:  Congruent and Constricted  Thought Process:  Coherent and Goal Directed  Orientation:  Full (Time, Place, and Person)  Thought Content:  Logical  Suicidal Thoughts:  Yes.  without intent/plan  Homicidal Thoughts:  No  Memory:  Immediate;   Fair Recent;   Fair  Judgement:  Fair  Insight:  Fair  Psychomotor Activity:  Normal  Concentration:  Concentration: Fair  Recall:  [employer] of Knowledge:  Fair  Language:  Fair  Akathisia:  No  Handed:  Right  AIMS (if indicated):     Assets:  Desire for Improvement  ADL's:  Intact  Cognition:  WNL  Sleep:  Number of Hours: 6.25     Treatment Plan Summary: Daily contact with patient to assess and evaluate symptoms and  progress in treatment and Medication management   Patient is a 59 year old male with the above-stated past psychiatric history who is seen in follow-up.  Chart reviewed. Patient discussed with nursing. Reports mood improvement, denies suicidal thoughts. Reports increased anxiety in settings of medical conditions. No withdrawal symptoms; last CIWA was zero. No objective signs of distress. Will continue current medications.    Plan:  -continue inpatient psych admission; 15-minute checks; daily contact with patient to assess and evaluate symptoms and progress in treatment; psychoeducation.  -continue scheduled  medications:  aspirin  81 mg Oral Daily   atorvastatin  40 mg Oral Daily   busPIRone  15 mg Oral TID   escitalopram  20 mg Oral Daily   hydrochlorothiazide  25 mg Oral Daily   lisinopril  40 mg Oral Daily   metFORMIN  500 mg Oral Q breakfast   mometasone-formoterol  2 puff Inhalation BID   nicotine  21 mg Transdermal Daily   pantoprazole  40 mg Oral Daily   QUEtiapine  400 mg Oral QHS   -continue PRN medications.  alum & mag hydroxide-simeth, clonazePAM, ibuprofen, LORazepam, magnesium hydroxide, menthol-cetylpyridinium, zolpidem   -Pertinent Labs: no new labs ordered today  -EKG: on 9/15 showed Qtc 470ms.  -Covid-test negative.    -Consults: No new consults placed since yesterday    -Disposition: Patient is not ready for discharge yet. All necessary aftercare will be arranged  prior to discharge Likely d/c home with outpatient psych follow-up.  -  I certify that the patient does need, on a daily basis, active treatment furnished directly by or requiring the supervision of inpatient psychiatric facility personnel.  For inpatient care, physician will direct treatment team plan within three days of admission and at least weekly thereafter.    Larita Fife, MD 09/09/2020, 11:03 AM

## 2020-09-09 NOTE — Progress Notes (Signed)
Pt is alert and oriented to person, place, time and situation. Pt has a flat affect, is calm, cooperative, pleasant, spends time watching tv in the dayroom, or resting quietly in his room. Pt is medication compliant, reports he has neck pain from falling prior to this admission given PRN pain medication for that with good effect. Pt denies depression, reports anxiety, given PRN medication for c/o 7/10 anxiety scale of 0-10, 10 being worst. Pt reports a good appetite. Will continue to monitor pt per Q15 minute face checks and monitor for safety and progress.

## 2020-09-09 NOTE — Progress Notes (Signed)
Patient has been pleasant and cooperative. Spending time out in the dayroom. Denies SI, HI and AVH

## 2020-09-10 NOTE — Progress Notes (Signed)
Patient has been calm and cooperative. Denies SI HI and AVH. Requested and received Ambien for sleep. No further complaints

## 2020-09-10 NOTE — Progress Notes (Signed)
Recreation Therapy Notes   Date: 09/10/2020  Time: 9:30 am   Location: Craft room     Behavioral response: N/A   Intervention Topic: Self-Esteem   Discussion/Intervention: Patient did not attend group.   Clinical Observations/Feedback:  Patient did not attend group.   Joyceline Maiorino LRT/CTRS        Sarit Sparano 09/10/2020 11:33 AM

## 2020-09-10 NOTE — Plan of Care (Signed)
Patient is appropriate in the unit.Stated that his anxiety and depression is getting better.Denies SI,HI and AVH.Patient had Motrin x 1 for neck pain with good result.Compliant with medications.Attended groups.Appetite and energy level good.Support and encouragement given.

## 2020-09-10 NOTE — [facility] Group Notes (Signed)
[facility] Group Notes:  (Nursing/MHT/Case Management/Adjunct)  Date:  09/10/2020  Time:  9:46 AM  Type of Therapy:  Community Meeting  Participation Level:  Did Not Attend  Adela Lank [facility] 09/10/2020, 9:46 AM

## 2020-09-10 NOTE — Plan of Care (Signed)
  Problem: Education: Goal: Knowledge of [facility] Education information/materials will improve Outcome: Progressing   Problem: Health Behavior/Discharge Planning: Goal: Identification of resources available to assist in meeting health care needs will improve Outcome: Progressing Goal: Compliance with treatment plan for underlying cause of condition will improve Outcome: Progressing   Problem: Physical Regulation: Goal: Ability to maintain clinical measurements within normal limits will improve Outcome: Progressing

## 2020-09-10 NOTE — Progress Notes (Signed)
[facility] MD Progress Note  09/10/2020 12:51 PM Ian [name]  MRN:  [id number] Subjective: Follow-up patient with chronic anxiety and depression.  He reports his mood is a little better.  He feels less shaky subjectively although to look at him he still shakes all over much of the time.  Still comes across as anxious and his affect.  Denies suicidal thoughts or intent.  Has been cooperative with treatment.  Patient is nervous about returning home because of the dilapidated state of his affairs. 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 Anxiety   . Barrett esophagus   . Cancer ([place])   . Coronary artery disease   . Depression   . GERD (gastroesophageal reflux disease)   . Hypertension   . Pre-diabetes   . Stroke [facility]) 2014   "mini-stroke" per patient    Past Surgical History:  Procedure Laterality Date  . ANGIOPLASTY    . APPENDECTOMY    . CARDIAC CATHETERIZATION    . CHOLECYSTECTOMY    . WRIST SURGERY Right    age 2   Family History:  Family History  Problem Relation Age of Onset  . Stroke Father   . Leukemia Father   . Breast cancer Sister    Family Psychiatric  History: See previous Social History:  Social History   Substance and Sexual Activity  Alcohol Use Yes     Social History   Substance and Sexual Activity  Drug Use Yes  . Types: Cocaine, Marijuana    Social History   Socioeconomic History  . Marital status: Divorced    Spouse name: Not on file  . Number of  children: Not on file  . Years of education: Not on file  . Highest education level: Not on file  Occupational History  . Not on file  Tobacco Use  . Smoking status: Current Every Day Smoker    Packs/day: 1.00    Types: Cigarettes  . Smokeless tobacco: Never Used  Vaping Use  . Vaping Use: Never used  Substance and Sexual Activity  . Alcohol use: Yes  . Drug use: Yes    Types: Cocaine, Marijuana  . Sexual activity: Not on file  Other Topics Concern  . Not on file  Social History Narrative  . Not on file   Social Determinants of Health   Financial Resource Strain:   . Difficulty of Paying Living Expenses: Not on file  Food Insecurity:   . Worried About Charity fundraiser in the Last Year: Not on file  . Ran Out of Food in the Last Year: Not on file  Transportation Needs:   . Lack of Transportation (Medical): Not on file  . Lack of Transportation (Non-Medical): Not on file  Physical Activity:   . Days of Exercise per Week: Not on file  . Minutes of Exercise per Session: Not on file  Stress:   . Feeling of Stress : Not on file  Social Connections:   . Frequency of Communication with Friends and Family: Not on file  .  Frequency of Social Gatherings with Friends and Family: Not on file  . Attends Religious Services: Not on file  . Active Member of Clubs or Organizations: Not on file  . Attends Archivist Meetings: Not on file  . Marital Status: Not on file   Additional Social History:                         Sleep: Fair  Appetite:  Fair  Current Medications: Current Facility-Administered Medications  Medication Dose Route Frequency Provider Last Rate Last Admin  . alum & mag hydroxide-simeth (MAALOX/MYLANTA) 200-200-20 MG/5ML suspension 30 mL  30 mL Oral Q4H PRN Lindon Romp A, NP   30 mL at 09/08/20 1232  . aspirin chewable tablet 81 mg  81 mg Oral Daily Neeley Sedivy, [name], MD   81 mg at 09/10/20 0258  . atorvastatin (LIPITOR) tablet 40 mg  40 mg  Oral Daily Michelle Vanhise, [name], MD   40 mg at 09/10/20 5277  . busPIRone (BUSPAR) tablet 15 mg  15 mg Oral TID Manvir Thorson, [name], MD   15 mg at 09/10/20 1136  . clonazePAM (KLONOPIN) tablet 0.5 mg  0.5 mg Oral BID PRN Tremane Spurgeon T, MD   0.5 mg at 09/09/20 1640  . escitalopram (LEXAPRO) tablet 20 mg  20 mg Oral Daily Yajaira Doffing T, MD   20 mg at 09/10/20 8242  . hydrochlorothiazide (HYDRODIURIL) tablet 25 mg  25 mg Oral Daily Heylee Tant, [name], MD   25 mg at 09/10/20 3536  . ibuprofen (ADVIL) tablet 600 mg  600 mg Oral Q6H PRN Gail Creekmore, [name], MD   600 mg at 09/09/20 1524  . lisinopril (ZESTRIL) tablet 40 mg  40 mg Oral Daily Lindon Romp A, NP   40 mg at 09/10/20 1443  . magnesium hydroxide (MILK OF MAGNESIA) suspension 30 mL  30 mL Oral Daily PRN Lindon Romp A, NP      . menthol-cetylpyridinium (CEPACOL) lozenge 3 mg  1 lozenge Oral PRN Kamea Dacosta, [name], MD   3 mg at 09/07/20 1515  . metFORMIN (GLUCOPHAGE-XR) 24 hr tablet 500 mg  500 mg Oral Q breakfast Ziair Penson, [name], MD   500 mg at 09/10/20 1540  . mometasone-formoterol (DULERA) 200-5 MCG/ACT inhaler 2 puff  2 puff Inhalation BID Lindon Romp A, NP   2 puff at 09/10/20 0825  . nicotine (NICODERM CQ - dosed in mg/24 hours) patch 21 mg  21 mg Transdermal Daily Solveig Fangman, [name], MD   21 mg at 09/10/20 0825  . pantoprazole (PROTONIX) EC tablet 40 mg  40 mg Oral Daily Birda Didonato, [name], MD   40 mg at 09/10/20 0867  . QUEtiapine (SEROQUEL XR) 24 hr tablet 400 mg  400 mg Oral QHS Salvatore Shear T, MD   400 mg at 09/09/20 2116  . zolpidem (AMBIEN) tablet 10 mg  10 mg Oral QHS PRN Theodora Lalanne, [name], MD   10 mg at 09/09/20 2116    Lab Results: No results found for this or any previous visit (from the past 48 hour(s)).  Blood Alcohol level:  Lab Results  Component Value Date   ETH 137 (H) 09/05/2020   ETH <10 61/95/0932    Metabolic Disorder Labs: Lab Results  Component Value Date   HGBA1C 7.4 (H) 08/26/2019   MPG 165.68 08/26/2019   MPG 136.98  12/13/2018   No results found for: PROLACTIN Lab Results  Component Value Date  CHOL 178 08/26/2019   TRIG 375 (H) 08/26/2019   HDL 23 (L) 08/26/2019   CHOLHDL 7.7 08/26/2019   VLDL 75 (H) 08/26/2019   LDLCALC 80 08/26/2019   LDLCALC 70 12/13/2018    Physical Findings: AIMS:  , ,  ,  ,    CIWA:  CIWA-Ar Total: 0 COWS:     Musculoskeletal: Strength & Muscle Tone: within normal limits Gait & Station: normal Patient leans: N/A  Psychiatric Specialty Exam: Physical Exam Vitals and nursing note reviewed.  Constitutional:      Appearance: He is well-developed.  HENT:     Head: Normocephalic and atraumatic.  Eyes:     Conjunctiva/sclera: Conjunctivae normal.     Pupils: Pupils are equal, round, and reactive to light.  Cardiovascular:     Heart sounds: Normal heart sounds.  Pulmonary:     Effort: Pulmonary effort is normal.  Abdominal:     Palpations: Abdomen is soft.  Musculoskeletal:        General: Normal range of motion.     Cervical back: Normal range of motion.  Skin:    General: Skin is warm and dry.  Neurological:     General: No focal deficit present.     Mental Status: He is alert.  Psychiatric:        Attention and Perception: He is inattentive.        Mood and Affect: Mood is anxious.        Speech: Speech is delayed.        Behavior: Behavior is agitated. Behavior is not aggressive or hyperactive.        Thought Content: Thought content is paranoid. Thought content does not include homicidal or suicidal ideation.        Cognition and Memory: Cognition is impaired.        Judgment: Judgment is impulsive.     Review of Systems  Constitutional: Negative.   HENT: Positive for sore throat and trouble swallowing.   Eyes: Negative.   Respiratory: Negative.   Cardiovascular: Negative.   Gastrointestinal: Negative.   Musculoskeletal: Negative.   Skin: Negative.   Neurological: Negative.   Psychiatric/Behavioral: Positive for dysphoric mood. Negative  for agitation, behavioral problems, confusion, decreased concentration and suicidal ideas. The patient is nervous/anxious.     Blood pressure 115/78, pulse 89, temperature 97.7 F (36.5 C), temperature source Oral, resp. rate 14, SpO2 95 %.There is no height or weight on file to calculate BMI.  General Appearance: Casual  Eye Contact:  Good  Speech:  Clear and Coherent  Volume:  Normal  Mood:  Euthymic  Affect:  Congruent  Thought Process:  Goal Directed  Orientation:  Full (Time, Place, and Person)  Thought Content:  Logical  Suicidal Thoughts:  No  Homicidal Thoughts:  No  Memory:  Immediate;   Fair Recent;   Fair Remote;   Fair  Judgement:  Fair  Insight:  Fair  Psychomotor Activity:  Normal  Concentration:  Concentration: Fair  Recall:  [employer] of Knowledge:  Fair  Language:  Fair  Akathisia:  No  Handed:  Right  AIMS (if indicated):     Assets:  Desire for Improvement  ADL's:  Intact  Cognition:  WNL  Sleep:  Number of Hours: 8     Treatment Plan Summary: Plan No change to current psychiatric medicine.  Psychoeducation and supportive counseling and encouragement.  No change to medical treatment for today.  Follow-up next day or so hope for  discharge fairly soon  Alethia Berthold, MD 09/10/2020, 12:51 PM

## 2020-09-10 NOTE — B[facility] Group Notes (Signed)
[facility] Group Notes:  (Nursing/MHT/Case Management/Adjunct)  Date:  09/10/2020  Time:  5:23 PM  Type of Therapy:  Psychoeducational Skills  Participation Level:  Active  Participation Quality:  Appropriate  Affect:  Appropriate  Cognitive:  Appropriate  Insight:  Improving  Engagement in Group:  Engaged  Modes of Intervention:  Education  Summary of Progress/Problems:  Ian [name] 09/10/2020, 5:23 PM

## 2020-09-10 NOTE — B[facility] Group Notes (Signed)
  [facility] LCSW Group Therapy Note  Date/Time:  09/10/2020 1:15PM  Type of Therapy and Topic:  Group Therapy:  Feelings About Hospitalization  Participation Level:  Active   Description of Group This process group involved patients discussing their feelings related to being hospitalized, as well as the benefits they see to being in the hospital.  These feelings and benefits were itemized.  The group then brainstormed specific ways in which they could seek those same benefits when they discharge and return home.  Therapeutic Goals Patient will identify and describe positive and negative feelings related to hospitalization Patient will verbalize benefits of hospitalization to themselves personally Patients will brainstorm together ways they can obtain similar benefits in the outpatient setting, identify barriers to wellness and possible solutions  Summary of Patient Progress:  The patient engaged in introductory check-in, sharing his name and daily scaling of 5/10, due to "I'm waking up". Pt expressed his primary feelings about being hospitalized are "feeling better that I'm here". Pt actively engaged in group discussion of benefits to being hospitalized, agreeing with alternate group members regarding medication stabilization and emotional support being provided through interactions. Pt identified he finds it difficult sharing his story with new people every time he has a new therapist which impacts his commitment to treatment. Pt actively discussed some means of receiving similar supports in community settings. Pt proved receptive to alternate group members input and feedback from [place].  Therapeutic Modalities Cognitive Behavioral Therapy Motivational Interviewing    Blane Ohara, [place] 09/10/2020  2:36 PM

## 2020-09-10 NOTE — Progress Notes (Signed)
Patient alert and oriented x 4, affect is flat but he brightens upon approach no distress noted he denies SI/HI/AVH interacting appropriately with peers and staff, he was complaint with medication regimen will continue to monitor.

## 2020-09-11 NOTE — Progress Notes (Signed)
Recreation Therapy Notes  Date: 09/11/2020  Time: 9:30 am   Location: Craft room     Behavioral response: N/A   Intervention Topic: Happiness   Discussion/Intervention: Patient did not attend group.   Clinical Observations/Feedback:  Patient did not attend group.   Reem Fleury LRT/CTRS         Jocelynn Gioffre 09/11/2020 11:58 AM

## 2020-09-11 NOTE — Progress Notes (Signed)
Pt is alert and oriented to person, place, time, and situation. Pt denies suicidal and homicidal ideation, denies feelings of depression and anxiety. Pt's affect is flat, reports his mood has improved. Pt is medication complaint. No distress noted, none reported, pt voices no complaints. Pt's appetite is good. Pt is clam, cooperative and pleasant, makes fair eye contact. Will continue to monitor pt per Q15 minute face checks and monitor for safety and progress.

## 2020-09-11 NOTE — Progress Notes (Signed)
[facility] MD Progress Note  09/11/2020 3:42 PM Ian [name]  MRN:  [id number] Subjective: Follow-up for this patient with depression.  He says he is having a bad day.  Mood is feeling more nervous and depressed.  Feeling more hopeless.  No active suicidal intent but ongoing suicidal ideation.  Stays withdrawn much of the time.  Feels shaky. Principal Problem: Severe recurrent major depression (HCC) Diagnosis: Principal Problem:   Severe recurrent major depression ([place]) Active Problems:   HTN (hypertension)   Alcohol use disorder, moderate, dependence (HCC)   Cocaine use disorder, moderate, dependence (HCC)   Generalized anxiety disorder   COPD exacerbation (HCC)  Total Time spent with patient: 30 minutes  Past Psychiatric History: Past history of longstanding chronic depression and anxiety  Past Medical History:  Past Medical History:  Diagnosis Date   Anxiety    Barrett esophagus    Cancer ([place])    Coronary artery disease    Depression    GERD (gastroesophageal reflux disease)    Hypertension    Pre-diabetes    Stroke ([place]) 2014   "mini-stroke" per patient    Past Surgical History:  Procedure Laterality Date   ANGIOPLASTY     APPENDECTOMY     CARDIAC CATHETERIZATION     CHOLECYSTECTOMY     WRIST SURGERY Right    age 25   Family History:  Family History  Problem Relation Age of Onset   Stroke Father    Leukemia Father    Breast cancer Sister    Family Psychiatric  History: See previous Social History:  Social History   Substance and Sexual Activity  Alcohol Use Yes     Social History   Substance and Sexual Activity  Drug Use Yes   Types: Cocaine, Marijuana    Social History   Socioeconomic History   Marital status: Divorced    Spouse name: Not on file   Number of children: Not on file   Years of education: Not on file   Highest education level: Not on file  Occupational History   Not on file  Tobacco Use   Smoking status:  Current Every Day Smoker    Packs/day: 1.00    Types: Cigarettes   Smokeless tobacco: Never Used  Vaping Use   Vaping Use: Never used  Substance and Sexual Activity   Alcohol use: Yes   Drug use: Yes    Types: Cocaine, Marijuana   Sexual activity: Not on file  Other Topics Concern   Not on file  Social History Narrative   Not on file   Social Determinants of Health   Financial Resource Strain:    Difficulty of Paying Living Expenses: Not on file  Food Insecurity:    Worried About Charity fundraiser in the Last Year: Not on file   [employer] of Food in the Last Year: Not on file  Transportation Needs:    Lack of Transportation (Medical): Not on file   Lack of Transportation (Non-Medical): Not on file  Physical Activity:    Days of Exercise per Week: Not on file   Minutes of Exercise per Session: Not on file  Stress:    Feeling of Stress : Not on file  Social Connections:    Frequency of Communication with Friends and Family: Not on file   Frequency of Social Gatherings with Friends and Family: Not on file   Attends Religious Services: Not on file   Active Member of Clubs or Organizations: Not  on file   Attends Club or Organization Meetings: Not on file   Marital Status: Not on file   Additional Social History:                         Sleep: Fair  Appetite:  Fair  Current Medications: Current Facility-Administered Medications  Medication Dose Route Frequency Provider Last Rate Last Admin   alum & mag hydroxide-simeth (MAALOX/MYLANTA) 200-200-20 MG/5ML suspension 30 mL  30 mL Oral Q4H PRN Lindon Romp A, NP   30 mL at 09/08/20 1232   aspirin chewable tablet 81 mg  81 mg Oral Daily Marcellino Fidalgo, [name], MD   81 mg at 09/11/20 0759   atorvastatin (LIPITOR) tablet 40 mg  40 mg Oral Daily Ival Pacer, [name], MD   40 mg at 09/11/20 0759   busPIRone (BUSPAR) tablet 15 mg  15 mg Oral TID Any Mcneice T, MD   15 mg at 09/11/20 1220   clonazePAM  (KLONOPIN) tablet 0.5 mg  0.5 mg Oral BID PRN Montray Kliebert T, MD   0.5 mg at 09/11/20 1220   escitalopram (LEXAPRO) tablet 20 mg  20 mg Oral Daily Margrett Kalb, [name], MD   20 mg at 09/11/20 0759   hydrochlorothiazide (HYDRODIURIL) tablet 25 mg  25 mg Oral Daily Hiedi Touchton, [name], MD   25 mg at 09/11/20 0759   ibuprofen (ADVIL) tablet 600 mg  600 mg Oral Q6H PRN Shanta Hartner T, MD   600 mg at 09/10/20 1717   lisinopril (ZESTRIL) tablet 40 mg  40 mg Oral Daily Lindon Romp A, NP   40 mg at 09/11/20 0758   magnesium hydroxide (MILK OF MAGNESIA) suspension 30 mL  30 mL Oral Daily PRN Lindon Romp A, NP       menthol-cetylpyridinium (CEPACOL) lozenge 3 mg  1 lozenge Oral PRN Makayela Secrest T, MD   3 mg at 09/07/20 1515   metFORMIN (GLUCOPHAGE-XR) 24 hr tablet 500 mg  500 mg Oral Q breakfast Caileigh Canche T, MD   500 mg at 09/11/20 0802   mometasone-formoterol (DULERA) 200-5 MCG/ACT inhaler 2 puff  2 puff Inhalation BID Lindon Romp A, NP   2 puff at 09/11/20 0802   nicotine (NICODERM CQ - dosed in mg/24 hours) patch 21 mg  21 mg Transdermal Daily Kriston Pasquarello T, MD   21 mg at 09/11/20 0759   pantoprazole (PROTONIX) EC tablet 40 mg  40 mg Oral Daily Adelise Buswell T, MD   40 mg at 09/11/20 0759   QUEtiapine (SEROQUEL XR) 24 hr tablet 400 mg  400 mg Oral QHS Costa Jha T, MD   400 mg at 09/10/20 2122   zolpidem (AMBIEN) tablet 10 mg  10 mg Oral QHS PRN Ceira Hoeschen, [name], MD   10 mg at 09/10/20 2122    Lab Results: No results found for this or any previous visit (from the past 35 hour(s)).  Blood Alcohol level:  Lab Results  Component Value Date   ETH 137 (H) 09/05/2020   ETH <10 45/02/8881    Metabolic Disorder Labs: Lab Results  Component Value Date   HGBA1C 7.4 (H) 08/26/2019   MPG 165.68 08/26/2019   MPG 136.98 12/13/2018   No results found for: PROLACTIN Lab Results  Component Value Date   CHOL 178 08/26/2019   TRIG 375 (H) 08/26/2019   HDL 23 (L) 08/26/2019   CHOLHDL 7.7  08/26/2019   VLDL 75 (H) 08/26/2019  [place] 80 08/26/2019   [place] 70 12/13/2018    Physical Findings: AIMS:  , ,  ,  ,    CIWA:  CIWA-Ar Total: 0 COWS:     Musculoskeletal: Strength & Muscle Tone: within normal limits Gait & Station: normal Patient leans: N/A  Psychiatric Specialty Exam: Physical Exam Vitals and nursing note reviewed.  Constitutional:      Appearance: He is well-developed.  HENT:     Head: Normocephalic and atraumatic.  Eyes:     Conjunctiva/sclera: Conjunctivae normal.     Pupils: Pupils are equal, round, and reactive to light.  Cardiovascular:     Heart sounds: Normal heart sounds.  Pulmonary:     Effort: Pulmonary effort is normal.  Abdominal:     Palpations: Abdomen is soft.  Musculoskeletal:        General: Normal range of motion.     Cervical back: Normal range of motion.  Skin:    General: Skin is warm and dry.  Neurological:     General: No focal deficit present.     Mental Status: He is alert.  Psychiatric:        Attention and Perception: Attention normal.        Mood and Affect: Mood is anxious and depressed.        Speech: Speech is delayed.        Behavior: Behavior is slowed.        Thought Content: Thought content includes suicidal ideation. Thought content does not include suicidal plan.        Cognition and Memory: Cognition is impaired.        Judgment: Judgment normal.     Review of Systems  Constitutional: Negative.   HENT: Negative.   Eyes: Negative.   Respiratory: Negative.   Cardiovascular: Negative.   Gastrointestinal: Negative.   Musculoskeletal: Negative.   Skin: Negative.   Neurological: Negative.   Psychiatric/Behavioral: Positive for dysphoric mood and suicidal ideas. The patient is nervous/anxious.     Blood pressure (!) 124/95, pulse 90, temperature 97.8 F (36.6 C), temperature source Oral, resp. rate 17, SpO2 98 %.There is no height or weight on file to calculate BMI.  General Appearance: Casual   Eye Contact:  Good  Speech:  Clear and Coherent  Volume:  Normal  Mood:  Anxious and Dysphoric  Affect:  Congruent  Thought Process:  Disorganized  Orientation:  Full (Time, Place, and Person)  Thought Content:  Illogical  Suicidal Thoughts:  No  Homicidal Thoughts:  No  Memory:  Immediate;   Fair Recent;   Fair Remote;   Fair  Judgement:  Impaired  Insight:  Shallow  Psychomotor Activity:  Decreased  Concentration:  Concentration: Fair  Recall:  [employer] of Knowledge:  Fair  Language:  Fair  Akathisia:  No  Handed:  Right  AIMS (if indicated):     Assets:  Desire for Improvement  ADL's:  Intact  Cognition:  WNL  Sleep:  Number of Hours: 6.75     Treatment Plan Summary: Daily contact with patient to assess and evaluate symptoms and progress in treatment, Medication management and Plan No change to current medicine.  Lots of time spent on soothing and supporting him with therapy.  Encourage group attendance to continue.  Social work is aware of his financial problems.  Reassess over the next day or 2 for possible discharge if things go better by later this week  Alethia Berthold, MD 09/11/2020, 3:42 PM

## 2020-09-11 NOTE — Tx Team (Signed)
Interdisciplinary Treatment and Diagnostic Plan Update  09/11/2020 Time of Session: 9:00AM Ian [name] MRN: [id number]  Principal Diagnosis: Severe recurrent major depression ([place])  Secondary Diagnoses: Principal Problem:   Severe recurrent major depression ([place]) Active Problems:   HTN (hypertension)   Alcohol use disorder, moderate, dependence (HCC)   Cocaine use disorder, moderate, dependence ([place])   Generalized anxiety disorder   COPD exacerbation ([place])   Current Medications:  Current Facility-Administered Medications  Medication Dose Route Frequency Provider Last Rate Last Admin  . alum & mag hydroxide-simeth (MAALOX/MYLANTA) 200-200-20 MG/5ML suspension 30 mL  30 mL Oral Q4H PRN Lindon Romp A, NP   30 mL at 09/08/20 1232  . aspirin chewable tablet 81 mg  81 mg Oral Daily [name], [name], MD   81 mg at 09/11/20 0759  . atorvastatin (LIPITOR) tablet 40 mg  40 mg Oral Daily [name], [name], MD   40 mg at 09/11/20 0759  . busPIRone (BUSPAR) tablet 15 mg  15 mg Oral TID [name], [name], MD   15 mg at 09/11/20 0759  . clonazePAM (KLONOPIN) tablet 0.5 mg  0.5 mg Oral BID PRN [name], [name], MD   0.5 mg at 09/09/20 1640  . escitalopram (LEXAPRO) tablet 20 mg  20 mg Oral Daily [name], [name], MD   20 mg at 09/11/20 0759  . hydrochlorothiazide (HYDRODIURIL) tablet 25 mg  25 mg Oral Daily [name], [name], MD   25 mg at 09/11/20 0759  . ibuprofen (ADVIL) tablet 600 mg  600 mg Oral Q6H PRN [name], [name], MD   600 mg at 09/10/20 1717  . lisinopril (ZESTRIL) tablet 40 mg  40 mg Oral Daily Lindon Romp A, NP   40 mg at 09/11/20 0758  . magnesium hydroxide (MILK OF MAGNESIA) suspension 30 mL  30 mL Oral Daily PRN Lindon Romp A, NP      . menthol-cetylpyridinium (CEPACOL) lozenge 3 mg  1 lozenge Oral PRN [name], [name], MD   3 mg at 09/07/20 1515  . metFORMIN (GLUCOPHAGE-XR) 24 hr tablet 500 mg  500 mg Oral Q breakfast [name], [name], MD   500 mg at 09/11/20 0802  .  mometasone-formoterol (DULERA) 200-5 MCG/ACT inhaler 2 puff  2 puff Inhalation BID Lindon Romp A, NP   2 puff at 09/11/20 0802  . nicotine (NICODERM CQ - dosed in mg/24 hours) patch 21 mg  21 mg Transdermal Daily [name], [name], MD   21 mg at 09/11/20 0759  . pantoprazole (PROTONIX) EC tablet 40 mg  40 mg Oral Daily [name], [name], MD   40 mg at 09/11/20 0759  . QUEtiapine (SEROQUEL XR) 24 hr tablet 400 mg  400 mg Oral QHS [name], [name], MD   400 mg at 09/10/20 2122  . zolpidem (AMBIEN) tablet 10 mg  10 mg Oral QHS PRN [name], [name], MD   10 mg at 09/10/20 2122   PTA Medications: Medications Prior to Admission  Medication Sig Dispense Refill Last Dose  . ADVAIR DISKUS 500-50 MCG/DOSE AEPB Inhale 1 puff into the lungs every 12 (twelve) hours.     Marland Kitchen albuterol (PROVENTIL HFA;VENTOLIN HFA) 108 (90 Base) MCG/ACT inhaler Inhale 2 puffs into the lungs every 6 (six) hours as needed for wheezing or shortness of breath. 1 Inhaler 0   . aspirin 81 MG chewable tablet Chew 1 tablet (81 mg total) by mouth daily. 30 tablet 1   . atorvastatin (LIPITOR) 40 MG tablet Take  1 tablet (40 mg total) by mouth daily at 6 PM. 7 tablet 0   . busPIRone (BUSPAR) 10 MG tablet Take 1 tablet (10 mg total) by mouth 3 (three) times daily. (Patient taking differently: Take 15 mg by mouth 3 (three) times daily. ) 90 tablet 1   . clonazePAM (KLONOPIN) 0.5 MG tablet Take 0.5 mg by mouth daily as needed for anxiety. (Patient not taking: Reported on 09/06/2020)     . cloNIDine (CATAPRES) 0.1 MG tablet Take 1 tablet (0.1 mg total) by mouth 2 (two) times daily as needed (BP Systolic > 284 or Disastolic > 132). 60 tablet 1   . escitalopram (LEXAPRO) 20 MG tablet Take 20 mg by mouth daily.     . hydrochlorothiazide (HYDRODIURIL) 25 MG tablet Take 25 mg by mouth daily.     Marland Kitchen ibuprofen (ADVIL,MOTRIN) 600 MG tablet Take 1 tablet (600 mg total) by mouth every 6 (six) hours as needed for moderate pain. 60 tablet 1   . lisinopril  (PRINIVIL,ZESTRIL) 20 MG tablet Take 1 tablet (20 mg total) by mouth daily. (Patient taking differently: Take 40 mg by mouth daily. ) 7 tablet 0   . metFORMIN (GLUCOPHAGE-XR) 500 MG 24 hr tablet Take 500 mg by mouth daily.     . mometasone-formoterol (DULERA) 200-5 MCG/ACT AERO Inhale 2 puffs into the lungs 2 (two) times daily. (Patient not taking: Reported on 10/05/2019) 1 Inhaler 1   . omeprazole (PRILOSEC) 20 MG capsule Take 20 mg by mouth daily.     . QUEtiapine (SEROQUEL XR) 300 MG 24 hr tablet Take 1 tablet (300 mg total) by mouth at bedtime. (Patient not taking: Reported on 09/06/2020) 30 tablet 1   . QUEtiapine (SEROQUEL XR) 400 MG 24 hr tablet Take 400 mg by mouth at bedtime.     . Sofosbuvir-Velpatasvir (EPCLUSA) 400-100 MG TABS Take 1 tablet by mouth daily at 6 PM. (Patient not taking: Reported on 08/25/2019) 30 tablet 0   . thiamine 100 MG tablet Take 1 tablet (100 mg total) by mouth daily. (Patient not taking: Reported on 10/05/2019) 30 tablet 1   . zolpidem (AMBIEN) 10 MG tablet Take 1 tablet (10 mg total) by mouth at bedtime. 30 tablet 1     Patient Stressors: Financial difficulties Marital or family conflict Medication change or noncompliance Substance abuse  Patient Strengths: Ability for insight Average or above average intelligence Communication skills Physical Health  Treatment Modalities: Medication Management, Group therapy, Case management,  1 to 1 session with clinician, Psychoeducation, Recreational therapy.   Physician Treatment Plan for Primary Diagnosis: Severe recurrent major depression ([place]) Long Term Goal(s): Improvement in symptoms so as ready for discharge Improvement in symptoms so as ready for discharge   Short Term Goals: Ability to disclose and discuss suicidal ideas Ability to demonstrate self-control will improve Ability to maintain clinical measurements within normal limits will improve Compliance with prescribed medications will improve Ability  to identify triggers associated with substance abuse/mental health issues will improve  Medication Management: Evaluate patient's response, side effects, and tolerance of medication regimen.  Therapeutic Interventions: 1 to 1 sessions, Unit Group sessions and Medication administration.  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[place]) Active Problems:   HTN (hypertension)   Alcohol use disorder, moderate, dependence (HCC)   Cocaine use disorder, moderate, dependence (HCC)   Generalized anxiety disorder   COPD exacerbation ([place])  Long Term Goal(s): Improvement in symptoms  so as ready for discharge Improvement in symptoms so as ready for discharge   Short Term Goals: Ability to disclose and discuss suicidal ideas Ability to demonstrate self-control will improve Ability to maintain clinical measurements within normal limits will improve Compliance with prescribed medications will improve Ability to identify triggers associated with substance abuse/mental health issues will improve     Medication Management: Evaluate patient's response, side effects, and tolerance of medication regimen.  Therapeutic Interventions: 1 to 1 sessions, Unit Group sessions and Medication administration.  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[place]) Long Term Goal(s): Knowledge of disease and therapeutic regimen to maintain health will improve  Short Term Goals: Ability to demonstrate self-control, Ability to participate in decision making will improve, Ability to verbalize feelings will improve, Ability to disclose and discuss suicidal ideas, Ability to identify and develop effective coping behaviors will improve and Compliance with prescribed medications will improve  Medication Management: RN will administer medications as ordered by  provider, will assess and evaluate patient's response and provide education to patient for prescribed medication. RN will report any adverse and/or side effects to prescribing provider.  Therapeutic Interventions: 1 on 1 counseling sessions, Psychoeducation, Medication administration, Evaluate responses to treatment, Monitor vital signs and CBGs as ordered, Perform/monitor CIWA, COWS, AIMS and Fall Risk screenings as ordered, Perform wound care treatments as ordered.  Evaluation of Outcomes: Progressing   LCSW Treatment Plan for Primary Diagnosis: Severe recurrent major depression ([place]) Long Term Goal(s): Safe transition to appropriate next level of care at discharge, Engage patient in therapeutic group addressing interpersonal concerns.  Short Term Goals: Engage patient in aftercare planning with referrals and resources, Increase social support, Increase ability to appropriately verbalize feelings, Increase emotional regulation, Facilitate acceptance of mental health diagnosis and concerns and Increase skills for wellness and recovery  Therapeutic Interventions: Assess for all discharge needs, 1 to 1 time with Social worker, Explore available resources and support systems, Assess for adequacy in community support network, Educate family and significant other(s) on suicide prevention, Complete Psychosocial Assessment, Interpersonal group therapy.  Evaluation of Outcomes: Progressing   Progress in Treatment: Attending groups: Yes. Participating in groups: Yes. Taking medication as prescribed: Yes. Toleration medication: Yes. Family/Significant other contact made: Yes, individual(s) contacted:  SPE completed with the patient's peer. Patient understands diagnosis: Yes. Discussing patient identified problems/goals with staff: Yes. Medical problems stabilized or resolved: Yes. Denies suicidal/homicidal ideation: Yes. Issues/concerns per patient self-inventory: No. Other: none  New problem(s)  identified: No, Describe:  none  New Short Term/Long Term Goal(s):  detox, elimination of symptoms of psychosis, medication management for mood stabilization; elimination of SI thoughts; development of comprehensive mental wellness/sobriety plan.  Patient Goals:  "get back on my meds and get some type of qualituy of living when I get out, it's been a lot building up that I don't know what to do about it"  Discharge Plan or Barriers: CSW will assist patient in developing discharge plans. Pt has asked for assistance with light bill and lot rent.  CSW has asked patient for copies of invoices so that they can be provided to charitable foundation for further assesing.  At this time the patient has not been able to do so.  Reason for Continuation of Hospitalization: Anxiety Depression Medication stabilization Suicidal ideation  Estimated Length of Stay:  1-7 days  Attendees: Patient: Ian [name] 09/11/2020 11:34 AM  Physician: Dr. Weber Cooks, MD 09/11/2020 11:34 AM  Nursing: Graceann Congress,  RN 09/11/2020 11:34 AM  RN Care Manager: 09/11/2020 11:34 AM  Social Worker: Assunta Curtis, LCSW 09/11/2020 11:34 AM  Recreational Therapist:  Devin Going, LRT 09/11/2020 11:34 AM  Other:  09/11/2020 11:34 AM  Other:  09/11/2020 11:34 AM  Other: 09/11/2020 11:34 AM    Scribe for Treatment Team: Rozann Lesches, LCSW 09/11/2020 11:34 AM

## 2020-09-12 NOTE — Progress Notes (Signed)
Patient alert and oriented x 4, affect is flat but he brightens upon no distress noted he appears sad but noted interacting appropriately with peers and staff, he rated depression a 6/10 on a scale (0 low - 10 high) he was complaint with medication regimen, 16 minutes safety checks maintained will continue to monitor.

## 2020-09-12 NOTE — Progress Notes (Signed)
Recreation Therapy Notes  INPATIENT RECREATION THERAPY ASSESSMENT  Patient Details Name: Ian [name] MRN: [id number] DOB: 26-Jan-1961 Today's Date: 09/12/2020       Information Obtained From: Patient  Able to Participate in Assessment/Interview: Yes  Patient Presentation: Responsive  Reason for Admission (Per Patient): Active Symptoms, Suicidal Ideation, Med Non-Compliance  Patient Stressors:    Coping Skills:   TV  Leisure Interests (2+):  Individual - TV  Frequency of Recreation/Participation:    Awareness of Community Resources:  Yes  Community Resources:  Other (Comment) ([facility])  Current Use:    If no, Barriers?:    Expressed Interest in [place] of Residence:  Insurance underwriter  Patient Main Form of Transportation: Diplomatic Services operational officer  Patient Strengths:  N/A  Patient Identified Areas of Improvement:  N/A  Patient Goal for Hospitalization:  Get my medication right  Current SI (including self-harm):  No  Current HI:  No  Current AVH: No  Staff Intervention Plan: Group Attendance, Collaborate with Interdisciplinary Treatment Team  Consent to Intern Participation: N/A  Ian [name] 09/12/2020, 4:17 PM

## 2020-09-12 NOTE — Progress Notes (Signed)
[facility] MD Progress Note  09/12/2020 4:29 PM Ian [name]  MRN:  [id number] Subjective: Patient seen chart reviewed.  Patient reports his mood is slightly better today.  Denies active suicidal thoughts but still feels hopeless and overwhelmed with anxiety thinking about his life at home.  No psychotic symptoms.  Gets out of his room and interacts pretty normally Principal Problem: Severe recurrent major depression ([place]) Diagnosis: Principal Problem:   Severe recurrent major depression ([place]) Active Problems:   HTN (hypertension)   Alcohol use disorder, moderate, dependence (HCC)   Cocaine use disorder, moderate, dependence (HCC)   Generalized anxiety disorder   COPD exacerbation ([place])  Total Time spent with patient: 30 minutes  Past Psychiatric History: Past history of recurrent anxiety and depression  Past Medical History:  Past Medical History:  Diagnosis Date  . Anxiety   . Barrett esophagus   . Cancer ([place])   . Coronary artery disease   . Depression   . GERD (gastroesophageal reflux disease)   . Hypertension   . Pre-diabetes   . Stroke [facility]) 2014   "mini-stroke" per patient    Past Surgical History:  Procedure Laterality Date  . ANGIOPLASTY    . APPENDECTOMY    . CARDIAC CATHETERIZATION    . CHOLECYSTECTOMY    . WRIST SURGERY Right    age 78   Family History:  Family History  Problem Relation Age of Onset  . Stroke Father   . Leukemia Father   . Breast cancer Sister    Family Psychiatric  History: See previous Social History:  Social History   Substance and Sexual Activity  Alcohol Use Yes     Social History   Substance and Sexual Activity  Drug Use Yes  . Types: Cocaine, Marijuana    Social History   Socioeconomic History  . Marital status: Divorced    Spouse name: Not on file  . Number of children: Not on file  . Years of education: Not on file  . Highest education level: Not on file  Occupational History  . Not on file  Tobacco Use  .  Smoking status: Current Every Day Smoker    Packs/day: 1.00    Types: Cigarettes  . Smokeless tobacco: Never Used  Vaping Use  . Vaping Use: Never used  Substance and Sexual Activity  . Alcohol use: Yes  . Drug use: Yes    Types: Cocaine, Marijuana  . Sexual activity: Not on file  Other Topics Concern  . Not on file  Social History Narrative  . Not on file   Social Determinants of Health   Financial Resource Strain:   . Difficulty of Paying Living Expenses: Not on file  Food Insecurity:   . Worried About Charity fundraiser in the Last Year: Not on file  . Ran Out of Food in the Last Year: Not on file  Transportation Needs:   . Lack of Transportation (Medical): Not on file  . Lack of Transportation (Non-Medical): Not on file  Physical Activity:   . Days of Exercise per Week: Not on file  . Minutes of Exercise per Session: Not on file  Stress:   . Feeling of Stress : Not on file  Social Connections:   . Frequency of Communication with Friends and Family: Not on file  . Frequency of Social Gatherings with Friends and Family: Not on file  . Attends Religious Services: Not on file  . Active Member of Clubs or Organizations: Not on  file  . Attends Archivist Meetings: Not on file  . Marital Status: Not on file   Additional Social History:                         Sleep: Fair  Appetite:  Fair  Current Medications: Current Facility-Administered Medications  Medication Dose Route Frequency Provider Last Rate Last Admin  . alum & mag hydroxide-simeth (MAALOX/MYLANTA) 200-200-20 MG/5ML suspension 30 mL  30 mL Oral Q4H PRN Lindon Romp A, NP   30 mL at 09/08/20 1232  . aspirin chewable tablet 81 mg  81 mg Oral Daily Tryson Lumley, [name], MD   81 mg at 09/12/20 4967  . atorvastatin (LIPITOR) tablet 40 mg  40 mg Oral Daily Kaleb Sek, [name], MD   40 mg at 09/12/20 5916  . busPIRone (BUSPAR) tablet 15 mg  15 mg Oral TID Zaven Klemens, [name], MD   15 mg at 09/12/20 1215   . clonazePAM (KLONOPIN) tablet 0.5 mg  0.5 mg Oral BID PRN Gerome Kokesh, [name], MD   0.5 mg at 09/12/20 0821  . escitalopram (LEXAPRO) tablet 20 mg  20 mg Oral Daily Iban Utz, [name], MD   20 mg at 09/12/20 3846  . hydrochlorothiazide (HYDRODIURIL) tablet 25 mg  25 mg Oral Daily Lizeth Bencosme, [name], MD   25 mg at 09/12/20 0813  . ibuprofen (ADVIL) tablet 600 mg  600 mg Oral Q6H PRN Saleem Coccia, [name], MD   600 mg at 09/12/20 0821  . lisinopril (ZESTRIL) tablet 40 mg  40 mg Oral Daily Lindon Romp A, NP   40 mg at 09/12/20 6599  . magnesium hydroxide (MILK OF MAGNESIA) suspension 30 mL  30 mL Oral Daily PRN Lindon Romp A, NP      . menthol-cetylpyridinium (CEPACOL) lozenge 3 mg  1 lozenge Oral PRN Raiquan Chandler, [name], MD   3 mg at 09/07/20 1515  . metFORMIN (GLUCOPHAGE-XR) 24 hr tablet 500 mg  500 mg Oral Q breakfast Clara Smolen, [name], MD   500 mg at 09/12/20 3570  . mometasone-formoterol (DULERA) 200-5 MCG/ACT inhaler 2 puff  2 puff Inhalation BID Lindon Romp A, NP   2 puff at 09/12/20 0813  . nicotine (NICODERM CQ - dosed in mg/24 hours) patch 21 mg  21 mg Transdermal Daily Keshan Reha, [name], MD   21 mg at 09/12/20 0813  . pantoprazole (PROTONIX) EC tablet 40 mg  40 mg Oral Daily Dashiell Franchino, [name], MD   40 mg at 09/12/20 1779  . QUEtiapine (SEROQUEL XR) 24 hr tablet 400 mg  400 mg Oral QHS Riaz Onorato T, MD   400 mg at 09/11/20 2116  . zolpidem (AMBIEN) tablet 10 mg  10 mg Oral QHS PRN Reise Hietala, [name], MD   10 mg at 09/11/20 2116    Lab Results: No results found for this or any previous visit (from the past 48 hour(s)).  Blood Alcohol level:  Lab Results  Component Value Date   ETH 137 (H) 09/05/2020   ETH <10 39/02/91    Metabolic Disorder Labs: Lab Results  Component Value Date   HGBA1C 7.4 (H) 08/26/2019   MPG 165.68 08/26/2019   MPG 136.98 12/13/2018   No results found for: PROLACTIN Lab Results  Component Value Date   CHOL 178 08/26/2019   TRIG 375 (H) 08/26/2019   HDL 23 (L) 08/26/2019    CHOLHDL 7.7 08/26/2019   VLDL 75 (H) 08/26/2019   [place]  80 08/26/2019   LDLCALC 70 12/13/2018    Physical Findings: AIMS:  , ,  ,  ,    CIWA:  CIWA-Ar Total: 0 COWS:     Musculoskeletal: Strength & Muscle Tone: within normal limits Gait & Station: normal Patient leans: N/A  Psychiatric Specialty Exam: Physical Exam Vitals and nursing note reviewed.  Constitutional:      Appearance: He is well-developed.  HENT:     Head: Normocephalic and atraumatic.  Eyes:     Conjunctiva/sclera: Conjunctivae normal.     Pupils: Pupils are equal, round, and reactive to light.  Cardiovascular:     Heart sounds: Normal heart sounds.  Pulmonary:     Effort: Pulmonary effort is normal.  Abdominal:     Palpations: Abdomen is soft.  Musculoskeletal:        General: Normal range of motion.     Cervical back: Normal range of motion.  Skin:    General: Skin is warm and dry.  Neurological:     General: No focal deficit present.     Mental Status: He is alert.  Psychiatric:        Attention and Perception: Attention normal.        Mood and Affect: Mood is anxious and depressed.        Speech: Speech is delayed.        Behavior: Behavior is cooperative.        Thought Content: Thought content normal.        Cognition and Memory: Cognition normal.        Judgment: Judgment normal.     Review of Systems  Constitutional: Negative.   HENT: Negative.   Eyes: Negative.   Respiratory: Negative.   Cardiovascular: Negative.   Gastrointestinal: Negative.   Musculoskeletal: Negative.   Skin: Negative.   Neurological: Negative.   Psychiatric/Behavioral: Positive for decreased concentration and dysphoric mood. The patient is nervous/anxious.     Blood pressure 110/78, pulse 94, temperature 97.7 F (36.5 C), temperature source Oral, resp. rate 17, SpO2 97 %.There is no height or weight on file to calculate BMI.  General Appearance: Casual  Eye Contact:  Good  Speech:  Clear and Coherent   Volume:  Normal  Mood:  Euthymic  Affect:  Congruent  Thought Process:  Coherent  Orientation:  Full (Time, Place, and Person)  Thought Content:  Logical  Suicidal Thoughts:  No  Homicidal Thoughts:  No  Memory:  Immediate;   Fair Recent;   Fair Remote;   Fair  Judgement:  Fair  Insight:  Fair  Psychomotor Activity:  Normal  Concentration:  Concentration: Fair  Recall:  [employer] of Knowledge:  Fair  Language:  Fair  Akathisia:  No  Handed:  Right  AIMS (if indicated):     Assets:  Desire for Improvement  ADL's:  Intact  Cognition:  WNL  Sleep:  Number of Hours: 7     Treatment Plan Summary: Daily contact with patient to assess and evaluate symptoms and progress in treatment, Medication management and Plan No change to medication.  Supportive therapy and encouragement.  Cognitive reframing to encourage him to look on the more positive side of things.  Encouraged him to keep working on practical changes he can make to improve his life situation at home.  Alethia Berthold, MD 09/12/2020, 4:29 PM

## 2020-09-12 NOTE — BHH Counselor (Signed)
CSW again followed up to see if patient had obtained the invoices for his lot rent and electric bill.   Patient reports that he has no way of getting in contact with his landlord.    He reports that he has not contacted his power company.  CSW again urged patient to contact so that any assistance could be provided if available.  Assunta Curtis, MSW, LCSW 09/12/2020 11:48 AM

## 2020-09-12 NOTE — Progress Notes (Signed)
Patient calm and pleasant during assessment. Pt denies SI/HI/AVH. Patient endorses anxiety and depression. Patient compliant with medication administration per MD orders. Pt observed interacting appropriately with staff and peers on the unit. Patient being monitored Q 15 minutes for safety per unit protocol. Patient remains safe on the unit.

## 2020-09-12 NOTE — Plan of Care (Signed)
Patient compliant with medication administration per MD orders  Problem: Education: Goal: Knowledge of the prescribed therapeutic regimen will improve Outcome: Progressing

## 2020-09-12 NOTE — Progress Notes (Signed)
Recreation Therapy Notes  Date: 09/12/2020  Time: 9:30 am   Location: Craft room  Behavioral response: Appropriate  Intervention Topic: Coping Skills  Discussion/Intervention:  Group content on today was focused on coping skills. The group defined what coping skills are and when they normally use coping skills. Individuals described how they normally cope with thing and the coping skills they normally use. Patients expressed why it is important to cope with things and how not coping with things can affect you. The group participated in the intervention "My coping box" and made coping boxes while adding coping skills they could use in the future to the box.  Clinical Observations/Feedback: Patient came to group late due to unknown reasons. Individual was social with peers and staff while participating in the intervention.  [place] LRT/CTRS         Gustavus Haskin 09/12/2020 12:42 PM

## 2020-09-12 NOTE — Progress Notes (Signed)
D: Pt alert and oriented. Pt rates depression 7/10, hopelessness 9/10, and anxiety 5/10. Pt reports energy level as low and concentration as being poor. Pt reports sleep last night as being good. Pt did receive medications for sleep and did find them helpful. Pt reports experiencing 10/10 neck and back pain, prn meds given. Pt denies experiencing any SI/HI, or AVH at this time verbally, however endorses SI on self inventory. Pt verbally contracts for safety and feels safe while here. Pt reports his anxiety being high in r/t another pt on the unit causing conflict.  A: Scheduled medications administered to pt, per MD orders. Support and encouragement provided. Frequent verbal contact made. Routine safety checks conducted q15 minutes.   R: No adverse drug reactions noted. Pt verbally contracts for safety at this time. Pt complaint with medications. Pt interacts well with others on the unit. Pt remains safe at this time. Will continue to monitor.

## 2020-09-13 NOTE — Plan of Care (Signed)
Pleasant on approach and active in the milieu. Attending groups and taking medications. Appetite is good. Patient reports that his sleep is improved. Denies SI?HI/VAH. Has no major concerns.

## 2020-09-13 NOTE — BHH Group Notes (Signed)
[facility] Group Notes:  (Nursing/MHT/Case Management/Adjunct)  Date:  09/13/2020  Time:  10:34 PM  Type of Therapy:  Group Therapy  Participation Level:  Active  Participation Quality:  Appropriate  Affect:  Appropriate  Cognitive:  Alert  Insight:  Good  Engagement in Group:  Engaged and said this morning was terrible and it got better.  Modes of Intervention:  Support  Summary of Progress/Problems:  Nehemiah Settle 09/13/2020, 10:34 PM

## 2020-09-13 NOTE — Progress Notes (Signed)
[facility] MD Progress Note  09/13/2020 5:02 PM Ian [name]  MRN:  [id number] Subjective: Ian [name]'s anxiety is worse today.  Apparently one of his neighbors called him and told him that the city or county put some kind of noticed on the door of his trailer.  He is afraid that it might be condemned and does not know how long he might have to try to address it.  As a result of course he is nearly panicky today but to his credit is still interacting okay with others.  Took clonazepam but did not get inappropriate.  Not reporting active suicidal thoughts but clearly overwhelmed and somewhat hopeless. 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 Anxiety   . Barrett esophagus   . Cancer ([place])   . Coronary artery disease   . Depression   . GERD (gastroesophageal reflux disease)   . Hypertension   . Pre-diabetes   . Stroke [facility]) 2014   "mini-stroke" per patient    Past Surgical History:  Procedure Laterality Date  . ANGIOPLASTY    . APPENDECTOMY    . CARDIAC CATHETERIZATION    . CHOLECYSTECTOMY    . WRIST SURGERY Right    age 23   Family History:  Family History  Problem Relation Age of Onset  . Stroke Father   . Leukemia Father   . Breast cancer Sister    Family Psychiatric  History: See previous Social History:  Social History   Substance and Sexual Activity  Alcohol Use Yes     Social History   Substance and Sexual Activity  Drug Use Yes  . Types: Cocaine, Marijuana    Social History    Socioeconomic History  . Marital status: Divorced    Spouse name: Not on file  . Number of children: Not on file  . Years of education: Not on file  . Highest education level: Not on file  Occupational History  . Not on file  Tobacco Use  . Smoking status: Current Every Day Smoker    Packs/day: 1.00    Types: Cigarettes  . Smokeless tobacco: Never Used  Vaping Use  . Vaping Use: Never used  Substance and Sexual Activity  . Alcohol use: Yes  . Drug use: Yes    Types: Cocaine, Marijuana  . Sexual activity: Not on file  Other Topics Concern  . Not on file  Social History Narrative  . Not on file   Social Determinants of Health   Financial Resource Strain:   . Difficulty of Paying Living Expenses: Not on file  Food Insecurity:   . Worried About Charity fundraiser in the Last Year: Not on file  . Ran Out of Food in the Last Year: Not on file  Transportation Needs:   . Lack of Transportation (Medical): Not on file  . Lack of Transportation (Non-Medical): Not on file  Physical Activity:   . Days of Exercise per Week: Not on file  . Minutes of Exercise per Session: Not on file  Stress:   . Feeling of  Stress : Not on file  Social Connections:   . Frequency of Communication with Friends and Family: Not on file  . Frequency of Social Gatherings with Friends and Family: Not on file  . Attends Religious Services: Not on file  . Active Member of Clubs or Organizations: Not on file  . Attends Archivist Meetings: Not on file  . Marital Status: Not on file   Additional Social History:                         Sleep: Fair  Appetite:  Fair  Current Medications: Current Facility-Administered Medications  Medication Dose Route Frequency Provider Last Rate Last Admin  . alum & mag hydroxide-simeth (MAALOX/MYLANTA) 200-200-20 MG/5ML suspension 30 mL  30 mL Oral Q4H PRN Ian [name] A, NP   30 mL at 09/08/20 1232  . aspirin chewable tablet 81 mg  81 mg Oral  Daily Ian [name], Ian Reno, MD   81 mg at 09/13/20 0809  . atorvastatin (LIPITOR) tablet 40 mg  40 mg Oral Daily Ian [name], Ian Reno, MD   40 mg at 09/13/20 0809  . busPIRone (BUSPAR) tablet 15 mg  15 mg Oral TID Ian [name], Ian Reno, MD   15 mg at 09/13/20 1655  . clonazePAM (KLONOPIN) tablet 0.5 mg  0.5 mg Oral BID PRN Ian [name], Ian Reno, MD   0.5 mg at 09/13/20 1550  . escitalopram (LEXAPRO) tablet 20 mg  20 mg Oral Daily Ian [name], Ian Reno, MD   20 mg at 09/13/20 0809  . hydrochlorothiazide (HYDRODIURIL) tablet 25 mg  25 mg Oral Daily Ian [name], Ian Reno, MD   25 mg at 09/13/20 0809  . ibuprofen (ADVIL) tablet 600 mg  600 mg Oral Q6H PRN Ian [name], Ian Reno, MD   600 mg at 09/13/20 1550  . lisinopril (ZESTRIL) tablet 40 mg  40 mg Oral Daily Ian [name] A, NP   40 mg at 09/13/20 0809  . magnesium hydroxide (MILK OF MAGNESIA) suspension 30 mL  30 mL Oral Daily PRN Ian [name] A, NP      . menthol-cetylpyridinium (CEPACOL) lozenge 3 mg  1 lozenge Oral PRN Ian [name], Ian Reno, MD   3 mg at 09/07/20 1515  . metFORMIN (GLUCOPHAGE-XR) 24 hr tablet 500 mg  500 mg Oral Q breakfast Ian [name], Ian Reno, MD   500 mg at 09/13/20 0810  . mometasone-formoterol (DULERA) 200-5 MCG/ACT inhaler 2 puff  2 puff Inhalation BID Ian [name] A, NP   2 puff at 09/13/20 0810  . nicotine (NICODERM CQ - dosed in mg/24 hours) patch 21 mg  21 mg Transdermal Daily Ian [name], Ian Reno, MD   21 mg at 09/13/20 0809  . pantoprazole (PROTONIX) EC tablet 40 mg  40 mg Oral Daily Ian [name], Ian Reno, MD   40 mg at 09/13/20 0809  . QUEtiapine (SEROQUEL XR) 24 hr tablet 400 mg  400 mg Oral QHS Ian [name] T, MD   400 mg at 09/12/20 2125  . zolpidem (AMBIEN) tablet 10 mg  10 mg Oral QHS PRN Ian [name], Ian Reno, MD   10 mg at 09/12/20 2125    Lab Results: No results found for this or any previous visit (from the past 48 hour(s)).  Blood Alcohol level:  Lab Results  Component Value Date   ETH 137 (H) 09/05/2020   ETH <10 25/95/6387    Metabolic Disorder Labs: Lab Results   Component Value Date   HGBA1C 7.4 (H) 08/26/2019  MPG 165.68 08/26/2019   MPG 136.98 12/13/2018   No results found for: PROLACTIN Lab Results  Component Value Date   CHOL 178 08/26/2019   TRIG 375 (H) 08/26/2019   HDL 23 (L) 08/26/2019   CHOLHDL 7.7 08/26/2019   VLDL 75 (H) 08/26/2019   LDLCALC 80 08/26/2019   LDLCALC 70 12/13/2018    Physical Findings: AIMS:  , ,  ,  ,    CIWA:  CIWA-Ar Total: 0 COWS:     Musculoskeletal: Strength & Muscle Tone: within normal limits Gait & Station: normal Patient leans: N/A  Psychiatric Specialty Exam: Physical Exam Vitals and nursing note reviewed.  Constitutional:      Appearance: He is well-developed.  HENT:     Head: Normocephalic and atraumatic.  Eyes:     Conjunctiva/sclera: Conjunctivae normal.     Pupils: Pupils are equal, round, and reactive to light.  Cardiovascular:     Heart sounds: Normal heart sounds.  Pulmonary:     Effort: Pulmonary effort is normal.  Abdominal:     Palpations: Abdomen is soft.  Musculoskeletal:        General: Normal range of motion.     Cervical back: Normal range of motion.  Skin:    General: Skin is warm and dry.  Neurological:     General: No focal deficit present.     Mental Status: He is alert.  Psychiatric:        Attention and Perception: Attention normal.        Mood and Affect: Mood is anxious.        Speech: Speech normal.        Behavior: Behavior is cooperative.        Thought Content: Thought content normal.        Cognition and Memory: Cognition normal.        Judgment: Judgment normal.     Review of Systems  Constitutional: Negative.   HENT: Negative.   Eyes: Negative.   Respiratory: Negative.   Cardiovascular: Negative.   Gastrointestinal: Negative.   Musculoskeletal: Negative.   Skin: Negative.   Neurological: Negative.   Psychiatric/Behavioral: The patient is nervous/anxious.     Blood pressure (!) 145/91, pulse 92, temperature 97.9 F (36.6 C),  temperature source Oral, resp. rate 17, SpO2 96 %.There is no height or weight on file to calculate BMI.  General Appearance: Casual  Eye Contact:  Good  Speech:  Normal Rate  Volume:  Normal  Mood:  Anxious  Affect:  Congruent  Thought Process:  Coherent  Orientation:  Full (Time, Place, and Person)  Thought Content:  Rumination and Tangential  Suicidal Thoughts:  No  Homicidal Thoughts:  No  Memory:  Immediate;   Fair Recent;   Fair Remote;   Fair  Judgement:  Fair  Insight:  Fair  Psychomotor Activity:  Normal  Concentration:  Concentration: Fair  Recall:  [employer] of Knowledge:  Fair  Language:  Fair  Akathisia:  No  Handed:  Right  AIMS (if indicated):     Assets:  Desire for Improvement  ADL's:  Intact  Cognition:  WNL  Sleep:  Number of Hours: 6.5     Treatment Plan Summary: Daily contact with patient to assess and evaluate symptoms and progress in treatment, Medication management and Plan On the one hand the good news is that he is doing a pretty good job of improving his coping skills.  The bad news is that this is the  kind of situation that if he goes back home might really push him into being actively suicidal.  Supportive counseling and encouragement and empathy offered today.  There is no indication to change his medicine right now.  He seems to be using the as needed's appropriately.  Alethia Berthold, MD 09/13/2020, 5:02 PM

## 2020-09-13 NOTE — Plan of Care (Signed)
Patient compliant with medication administration per MD orders  Problem: Education: Goal: Knowledge of the prescribed therapeutic regimen will improve Outcome: Progressing

## 2020-09-13 NOTE — Progress Notes (Signed)
BRIEF PHARMACY NOTE   This patient attended and participated in Medication Management Group counseling led by [facility] staff pharmacist.  This interactive class reviews basic information about prescription medications and education on personal responsibility in medication management.  The class also includes general knowledge of 3 main classes of behavioral medications, including antipsychotics, antidepressants, and mood stabilizers.     Patient behavior was appropriate for group setting.   Educational materials sourced from:  "Medication Do's and Don'ts" from [email or web address]   "Mental Health Medications" from [email or web address]#part [place], PharmD, BCPS Clinical Pharmacist 09/13/2020 3:19 PM

## 2020-09-13 NOTE — Progress Notes (Signed)
Recreation Therapy Notes  Date: 09/13/2020  Time: 9:30 am   Location: Room 21   Behavioral response: N/A   Intervention Topic: Animal Assisted therapy    Discussion/Intervention: Patient did not attend group.   Clinical Observations/Feedback:  Patient did not attend group.   Markas [place] LRT/CTRS           Kayvon Mo 09/13/2020 12:24 PM

## 2020-09-13 NOTE — Progress Notes (Signed)
Patient calm and pleasant during assessment. Pt denies SI/HI/AVH. Patient endorses anxiety and depression stating it was from finding out today that there is an issue with the place he is living and it needs to be fixed up. Patient given education, support, and encouragement to be active in his treatment plan. Patient compliant with medication administration per MD orders. Pt observed interacting appropriately with staff and peers on the unit. Patient being monitored Q 15 minutes for safety per unit protocol. Patient remains safe on the unit.

## 2020-09-14 NOTE — BHH Group Notes (Signed)
[place] Group Notes:  (Nursing/MHT/Case Management/Adjunct)  Date:  09/14/2020  Time:  10:13 AM  Type of Therapy:  COMMUNITY MEETING  Participation Level:  Did Not Attend   Summary of Progress/Problems:  Ian [name] 09/14/2020, 10:13 AM

## 2020-09-14 NOTE — Progress Notes (Signed)
D: Pt alert and oriented. Pt rates depression 9/10, hopelessness 8/10, and anxiety 8/10.Pt goal: "See if I can get in my home." Pt reports energy level as low and concentration as being poor. Pt reports sleep last night as being fair. Pt did receive medications for sleep and sometimes find them helpful. Pt denies experiencing any pain at this time. Pt denies experiencing any HI, or AVH at this time, however endorses SI w/o a plan. Pt reports feeling safe while he is here. Pt is anxious about the his situations and his living condition. Pt shares that he does not eat as well at home and that he only has a microwave. Pt shares that when he gets meat from the food pantry that he has a neighbor that will hold onto it for him and will cook it for him. Pt states once he cooks it he brings it by to him but it's not very often. Pt also shares that his fridge doesn't work well that it doesn't keep things cold.   A: Scheduled medications administered to pt, per MD orders. Support and encouragement provided. Frequent verbal contact made. Routine safety checks conducted q15 minutes.   R: No adverse drug reactions noted. Pt verbally contracts for safety at this time. Pt complaint with medications and treatment plan. Pt interacts well with others on the unit. Pt remains safe at this time. Will continue to monitor.

## 2020-09-14 NOTE — Progress Notes (Signed)
[facility] MD Progress Note  09/14/2020 2:59 PM Ian [name]  MRN:  [id number] Subjective: Follow-up patient with severe depression and anxiety.  Still very focused on the status of his trailer.  Nervous today.  Talks about how if he had to move immediately he would probably end up trying to harm himself.  I do not get the impression that there is anything manipulative about this.  He is pretty genuine in discussing his depression and anxiety.  Using the Klonopin but appropriately and not constantly.  No suicidal thoughts at this time.  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 Anxiety   . Barrett esophagus   . Cancer ([place])   . Coronary artery disease   . Depression   . GERD (gastroesophageal reflux disease)   . Hypertension   . Pre-diabetes   . Stroke [facility]) 2014   "mini-stroke" per patient    Past Surgical History:  Procedure Laterality Date  . ANGIOPLASTY    . APPENDECTOMY    . CARDIAC CATHETERIZATION    . CHOLECYSTECTOMY    . WRIST SURGERY Right    age 11   Family History:  Family History  Problem Relation Age of Onset  . Stroke Father   . Leukemia Father   . Breast cancer Sister    Family Psychiatric  History: See previous Social History:  Social History   Substance and Sexual Activity  Alcohol Use Yes     Social History   Substance and Sexual Activity  Drug Use Yes  .  Types: Cocaine, Marijuana    Social History   Socioeconomic History  . Marital status: Divorced    Spouse name: Not on file  . Number of children: Not on file  . Years of education: Not on file  . Highest education level: Not on file  Occupational History  . Not on file  Tobacco Use  . Smoking status: Current Every Day Smoker    Packs/day: 1.00    Types: Cigarettes  . Smokeless tobacco: Never Used  Vaping Use  . Vaping Use: Never used  Substance and Sexual Activity  . Alcohol use: Yes  . Drug use: Yes    Types: Cocaine, Marijuana  . Sexual activity: Not on file  Other Topics Concern  . Not on file  Social History Narrative  . Not on file   Social Determinants of Health   Financial Resource Strain:   . Difficulty of Paying Living Expenses: Not on file  Food Insecurity:   . Worried About Charity fundraiser in the Last Year: Not on file  . Ran Out of Food in the Last Year: Not on file  Transportation Needs:   . Lack of Transportation (Medical): Not on file  . Lack of Transportation (Non-Medical): Not on file  Physical Activity:   . Days of Exercise per Week: Not on file  . Minutes of Exercise per Session: Not on file  Stress:   . Feeling  of Stress : Not on file  Social Connections:   . Frequency of Communication with Friends and Family: Not on file  . Frequency of Social Gatherings with Friends and Family: Not on file  . Attends Religious Services: Not on file  . Active Member of Clubs or Organizations: Not on file  . Attends Archivist Meetings: Not on file  . Marital Status: Not on file   Additional Social History:                         Sleep: Fair  Appetite:  Fair  Current Medications: Current Facility-Administered Medications  Medication Dose Route Frequency Provider Last Rate Last Admin  . alum & mag hydroxide-simeth (MAALOX/MYLANTA) 200-200-20 MG/5ML suspension 30 mL  30 mL Oral Q4H PRN Lindon Romp A, NP   30 mL at 09/08/20 1232   . aspirin chewable tablet 81 mg  81 mg Oral Daily Tanzie Rothschild, [name], MD   81 mg at 09/14/20 8115  . atorvastatin (LIPITOR) tablet 40 mg  40 mg Oral Daily Oceanna Arruda, [name], MD   40 mg at 09/14/20 7262  . busPIRone (BUSPAR) tablet 15 mg  15 mg Oral TID Aylin Rhoads, [name], MD   15 mg at 09/14/20 1206  . clonazePAM (KLONOPIN) tablet 0.5 mg  0.5 mg Oral BID PRN Anahid Eskelson, [name], MD   0.5 mg at 09/13/20 1550  . escitalopram (LEXAPRO) tablet 20 mg  20 mg Oral Daily Akasha Melena, [name], MD   20 mg at 09/14/20 0809  . hydrochlorothiazide (HYDRODIURIL) tablet 25 mg  25 mg Oral Daily Jaquavion Mccannon, [name], MD   25 mg at 09/14/20 0809  . ibuprofen (ADVIL) tablet 600 mg  600 mg Oral Q6H PRN Leonia Heatherly, [name], MD   600 mg at 09/14/20 1206  . lisinopril (ZESTRIL) tablet 40 mg  40 mg Oral Daily Lindon Romp A, NP   40 mg at 09/14/20 0809  . magnesium hydroxide (MILK OF MAGNESIA) suspension 30 mL  30 mL Oral Daily PRN Lindon Romp A, NP      . menthol-cetylpyridinium (CEPACOL) lozenge 3 mg  1 lozenge Oral PRN Joshalyn Ancheta, [name], MD   3 mg at 09/07/20 1515  . metFORMIN (GLUCOPHAGE-XR) 24 hr tablet 500 mg  500 mg Oral Q breakfast Jaequan Propes, [name], MD   500 mg at 09/14/20 0809  . mometasone-formoterol (DULERA) 200-5 MCG/ACT inhaler 2 puff  2 puff Inhalation BID Lindon Romp A, NP   2 puff at 09/14/20 0809  . nicotine (NICODERM CQ - dosed in mg/24 hours) patch 21 mg  21 mg Transdermal Daily Kristain Filo, [name], MD   21 mg at 09/14/20 0814  . pantoprazole (PROTONIX) EC tablet 40 mg  40 mg Oral Daily Steaven Wholey, [name], MD   40 mg at 09/14/20 0355  . QUEtiapine (SEROQUEL XR) 24 hr tablet 400 mg  400 mg Oral QHS Gonsalo Cuthbertson T, MD   400 mg at 09/13/20 2119  . zolpidem (AMBIEN) tablet 10 mg  10 mg Oral QHS PRN Jourdyn Hasler, [name], MD   10 mg at 09/13/20 2119    Lab Results: No results found for this or any previous visit (from the past 48 hour(s)).  Blood Alcohol level:  Lab Results  Component Value Date   ETH 137 (H) 09/05/2020   ETH <10  97/41/6384    Metabolic Disorder Labs: Lab Results  Component Value Date   HGBA1C 7.4 (H) 08/26/2019  MPG 165.68 08/26/2019   MPG 136.98 12/13/2018   No results found for: PROLACTIN Lab Results  Component Value Date   CHOL 178 08/26/2019   TRIG 375 (H) 08/26/2019   HDL 23 (L) 08/26/2019   CHOLHDL 7.7 08/26/2019   VLDL 75 (H) 08/26/2019   LDLCALC 80 08/26/2019   LDLCALC 70 12/13/2018    Physical Findings: AIMS:  , ,  ,  ,    CIWA:  CIWA-Ar Total: 0 COWS:     Musculoskeletal: Strength & Muscle Tone: within normal limits Gait & Station: normal Patient leans: N/A  Psychiatric Specialty Exam: Physical Exam Vitals and nursing note reviewed.  Constitutional:      Appearance: He is well-developed.  HENT:     Head: Normocephalic and atraumatic.  Eyes:     Conjunctiva/sclera: Conjunctivae normal.     Pupils: Pupils are equal, round, and reactive to light.  Cardiovascular:     Heart sounds: Normal heart sounds.  Pulmonary:     Effort: Pulmonary effort is normal.  Abdominal:     Palpations: Abdomen is soft.  Musculoskeletal:        General: Normal range of motion.     Cervical back: Normal range of motion.  Skin:    General: Skin is warm and dry.  Neurological:     General: No focal deficit present.     Mental Status: He is alert.  Psychiatric:        Attention and Perception: Attention normal.        Mood and Affect: Mood is anxious.        Speech: Speech normal.        Behavior: Behavior is agitated. Behavior is not aggressive.        Thought Content: Thought content does not include homicidal or suicidal ideation.        Cognition and Memory: Cognition is impaired.        Judgment: Judgment is impulsive.     Review of Systems  Constitutional: Negative.   HENT: Negative.   Eyes: Negative.   Respiratory: Negative.   Cardiovascular: Negative.   Gastrointestinal: Negative.   Musculoskeletal: Negative.   Skin: Negative.   Neurological: Negative.    Psychiatric/Behavioral: Positive for dysphoric mood and suicidal ideas. The patient is nervous/anxious.     Blood pressure 126/89, pulse 90, temperature 97.9 F (36.6 C), temperature source Oral, resp. rate 18, SpO2 97 %.There is no height or weight on file to calculate BMI.  General Appearance: Casual  Eye Contact:  Fair  Speech:  Normal Rate  Volume:  Normal  Mood:  Euthymic  Affect:  Congruent  Thought Process:  Linear  Orientation:  Full (Time, Place, and Person)  Thought Content:  Logical  Suicidal Thoughts:  No  Homicidal Thoughts:  No  Memory:  Immediate;   Fair Recent;   Fair Remote;   Fair  Judgement:  Fair  Insight:  Fair  Psychomotor Activity:  Normal  Concentration:  Concentration: Fair  Recall:  [employer] of Knowledge:  Fair  Language:  Fair  Akathisia:  No  Handed:  Right  AIMS (if indicated):     Assets:  Desire for Improvement Housing Physical Health Social Support  ADL's:  Intact  Cognition:  WNL  Sleep:  Number of Hours: 7.3     Treatment Plan Summary: Daily contact with patient to assess and evaluate symptoms and progress in treatment, Medication management and Plan No change to current medication.  Supportive counseling.  Encourage his group attendance and social activity.  Possible discharge by the first day or 2 of next week  Alethia Berthold, MD 09/14/2020, 2:59 PM

## 2020-09-14 NOTE — Progress Notes (Addendum)
Recreation Therapy Notes   Date: 09/14/2020  Time: 9:30 am   Location: Craft room     Behavioral response: N/A   Intervention Topic: Goals   Discussion/Intervention: Patient did not attend group.   Clinical Observations/Feedback:  Patient did not attend group.   Myrella Fahs LRT/CTRS        Wilena Tyndall 09/14/2020 11:45 AM

## 2020-09-15 NOTE — Progress Notes (Addendum)
[facility] MD Progress Note  09/15/2020 9:06 AM [name]  MRN:  [id number] Subjective:  [name] is being seen for the first time today by this provider.  Chart notes reviewed.  He was seen for initial psychiatric evaluation on September 06, 2020.  He reviews the events that had transpired, particularly financial problems, and especially regarding his trailer.  Says that it is "trashed" with pest infestation that has been going on since his mother died 5 years ago; he got depressed when she died, "she was my world."  Says that he has been slowly giving up hope and it became worse around May to June of this year.  He contemplated suicide but decided to get help instead. Patient does report feeling better since his admission and is tolerating his medications well.  Shares that he not taking his medications 1-1/2 weeks ago, besides his Ambien and Seroquel.  Depression is a 6 out of 10 with 10 being high.  Anxiety is a 5 out of 10.  He did sleep well last night.  No new medical issues.  Importantly, he found out yesterday afternoon that the city of [place] posted a notice on his door that his trailer is not to be occupied until all of the trash is removed from the premises.  He has 30 days to do this.  States that he felt the pressure of completing this yesterday which contributed to elevated depression yesterday. 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 Anxiety   . Barrett esophagus   . Cancer ([place])   . Coronary artery disease   . Depression   . GERD (gastroesophageal reflux disease)   . Hypertension   . Pre-diabetes   . Stroke [facility]) 2014   "mini-stroke" per patient    Past Surgical History:  Procedure Laterality Date  . ANGIOPLASTY    . APPENDECTOMY    . CARDIAC CATHETERIZATION    . CHOLECYSTECTOMY    . WRIST SURGERY Right    age 59   Family History:  Family History  Problem Relation Age of Onset  . Stroke Father   . Leukemia Father   . Breast cancer Sister    Family Psychiatric  History: See previous Social History:  Social History   Substance and Sexual Activity  Alcohol Use Yes     Social History   Substance and Sexual Activity  Drug Use Yes  . Types: Cocaine, Marijuana    Social History   Socioeconomic History  . Marital status: Divorced    Spouse name: Not on file  . Number of children: Not on file  . Years of education: Not on file  . Highest education level: Not on file  Occupational History  . Not on file  Tobacco Use  . Smoking status: Current Every Day Smoker    Packs/day: 1.00    Types: Cigarettes  . Smokeless tobacco: Never Used  Vaping Use  . Vaping Use: Never used  Substance  and Sexual Activity  . Alcohol use: Yes  . Drug use: Yes    Types: Cocaine, Marijuana  . Sexual activity: Not on file  Other Topics Concern  . Not on file  Social History Narrative  . Not on file   Social Determinants of Health   Financial Resource Strain:   . Difficulty of Paying Living Expenses: Not on file  Food Insecurity:   . Worried About Charity fundraiser in the Last Year: Not on file  . Ran Out of Food in the Last Year: Not on file  Transportation Needs:   . Lack of Transportation (Medical): Not on file  . Lack of Transportation (Non-Medical): Not on file  Physical Activity:   . Days of Exercise per Week: Not on file  . Minutes of Exercise per Session: Not on file   Stress:   . Feeling of Stress : Not on file  Social Connections:   . Frequency of Communication with Friends and Family: Not on file  . Frequency of Social Gatherings with Friends and Family: Not on file  . Attends Religious Services: Not on file  . Active Member of Clubs or Organizations: Not on file  . Attends Archivist Meetings: Not on file  . Marital Status: Not on file   Additional Social History:                         Sleep: Fair  Appetite:  Fair  Current Medications: Current Facility-Administered Medications  Medication Dose Route Frequency Provider Last Rate Last Admin  . alum & mag hydroxide-simeth (MAALOX/MYLANTA) 200-200-20 MG/5ML suspension 30 mL  30 mL Oral Q4H PRN Lindon Romp A, NP   30 mL at 09/08/20 1232  . aspirin chewable tablet 81 mg  81 mg Oral Daily [name], [name], MD   81 mg at 09/14/20 1884  . atorvastatin (LIPITOR) tablet 40 mg  40 mg Oral Daily [name], [name], MD   40 mg at 09/14/20 1660  . busPIRone (BUSPAR) tablet 15 mg  15 mg Oral TID [name], [name], MD   15 mg at 09/14/20 1647  . clonazePAM (KLONOPIN) tablet 0.5 mg  0.5 mg Oral BID PRN [name], [name], MD   0.5 mg at 09/13/20 1550  . escitalopram (LEXAPRO) tablet 20 mg  20 mg Oral Daily [name], [name], MD   20 mg at 09/14/20 0809  . hydrochlorothiazide (HYDRODIURIL) tablet 25 mg  25 mg Oral Daily [name], [name], MD   25 mg at 09/14/20 0809  . ibuprofen (ADVIL) tablet 600 mg  600 mg Oral Q6H PRN [name], [name], MD   600 mg at 09/14/20 1206  . lisinopril (ZESTRIL) tablet 40 mg  40 mg Oral Daily Lindon Romp A, NP   40 mg at 09/14/20 0809  . magnesium hydroxide (MILK OF MAGNESIA) suspension 30 mL  30 mL Oral Daily PRN Lindon Romp A, NP      . menthol-cetylpyridinium (CEPACOL) lozenge 3 mg  1 lozenge Oral PRN [name], [name], MD   3 mg at 09/07/20 1515  . metFORMIN (GLUCOPHAGE-XR) 24 hr tablet 500 mg  500 mg Oral Q breakfast [name], [name], MD   500 mg at 09/14/20 0809  .  mometasone-formoterol (DULERA) 200-5 MCG/ACT inhaler 2 puff  2 puff Inhalation BID Lindon Romp A, NP   2 puff at 09/14/20 2011  . nicotine (NICODERM CQ - dosed in mg/24 hours) patch 21 mg  21 mg Transdermal Daily [name], [name], MD   21 mg at 09/14/20 0814  . pantoprazole (PROTONIX) EC tablet 40 mg  40 mg Oral Daily [name], [name], MD   40 mg at 09/14/20 9371  . QUEtiapine (SEROQUEL XR) 24 hr tablet 400 mg  400 mg Oral QHS [name], [name], MD   400 mg at 09/14/20 2118  . zolpidem (AMBIEN) tablet 10 mg  10 mg Oral QHS PRN [name], [name], MD   10 mg at 09/14/20 2118    Lab Results: No results found for this or any previous visit (from the past 48 hour(s)).  Blood Alcohol level:  Lab Results  Component Value Date   ETH 137 (H) 09/05/2020   ETH <10 69/67/8938    Metabolic Disorder Labs: Lab Results  Component Value Date   HGBA1C 7.4 (H) 08/26/2019   MPG 165.68 08/26/2019   MPG 136.98 12/13/2018   No results found for: PROLACTIN Lab Results  Component Value Date   CHOL 178 08/26/2019   TRIG 375 (H) 08/26/2019   HDL 23 (L) 08/26/2019   CHOLHDL 7.7 08/26/2019   VLDL 75 (H) 08/26/2019   LDLCALC 80 08/26/2019   LDLCALC 70 12/13/2018    Physical Findings: AIMS:  , ,  ,  ,    CIWA:  CIWA-Ar Total: 0 COWS:     Musculoskeletal: Strength & Muscle Tone: within normal limits Gait & Station: normal Patient leans: N/A  Psychiatric Specialty Exam: Physical Exam Vitals and nursing note reviewed.  Constitutional:      Appearance: He is well-developed.  HENT:     Head: Normocephalic and atraumatic.  Eyes:     Conjunctiva/sclera: Conjunctivae normal.     Pupils: Pupils are equal, round, and reactive to light.  Cardiovascular:     Heart sounds: Normal heart sounds.  Pulmonary:     Effort: Pulmonary effort is normal.  Abdominal:     Palpations: Abdomen is soft.  Musculoskeletal:        General: Normal range of motion.     Cervical back: Normal range of motion.  Skin:     General: Skin is warm and dry.  Neurological:     General: No focal deficit present.     Mental Status: He is alert.  Psychiatric:        Attention and Perception: Attention normal.        Mood and Affect: Mood is anxious.        Speech: Speech normal.        Behavior: Behavior is agitated. Behavior is not aggressive.        Thought Content: Thought content does not include homicidal or suicidal ideation.        Cognition and Memory: Cognition is impaired.        Judgment: Judgment is impulsive.     Review of Systems  Constitutional: Negative.   HENT: Negative.   Eyes: Negative.   Respiratory: Negative.   Cardiovascular: Negative.   Gastrointestinal: Negative.   Musculoskeletal: Negative.   Skin: Negative.   Neurological: Negative.   Psychiatric/Behavioral: Positive for dysphoric mood and suicidal ideas. The patient is nervous/anxious.     Blood pressure 133/87, pulse 94, temperature 97.9 F (36.6 C), temperature source Oral, resp. rate 18, SpO2 96 %.There is no height or weight on file to calculate BMI.  General Appearance: Casual  Eye Contact:  good  Speech:  Normal Rate  Volume:  Normal  Mood:  Depressed  /Affect:  neutrall  Thought Process:  Linear  Orientation:  Full (Time, Place, and Person)  Thought Content:  Logical  Suicidal Thoughts:  No  Homicidal Thoughts:  No  Memory:  Immediate;   Fair Recent;   Fair Remote;   Fair  Judgement:  Fair  Insight:  Fair  Psychomotor Activity:  Normal  Concentration:  Concentration: Fair  Recall:  [place] of Knowledge:  good  Language:  Fair  Akathisia:  No  Handed:  Right  AIMS (if indicated):     Assets:  Desire for Improvement Housing Physical Health Social Support  ADL's:  Intact  Cognition:  WNL  Sleep:  Number of Hours: 8     Treatment Plan Summary: Daily contact with patient to assess and evaluate symptoms and progress in treatment, Medication management and Plan No change to current medication.   Supportive counseling.  Encourage his group attendance and social activity.  Possible discharge by the first day or 2 of next week    09/15/2020 No changes Pt discussed with staff and nursing No signs of EPS, NMS,.    Rulon Sera, MD 09/15/2020, 9:06 AM

## 2020-09-15 NOTE — Plan of Care (Signed)
  Problem: Education: Goal: Knowledge of [facility] Education information/materials will improve Outcome: Progressing   Problem: Health Behavior/Discharge Planning: Goal: Identification of resources available to assist in meeting health care needs will improve Outcome: Progressing Goal: Compliance with treatment plan for underlying cause of condition will improve Outcome: Progressing

## 2020-09-15 NOTE — Tx Team (Signed)
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 alum & mag hydroxide-simeth (MAALOX/MYLANTA) 200-200-20 MG/5ML suspension 30 mL  30 mL Oral Q4H PRN Lindon Romp A, NP   30 mL at 09/08/20 1232  . aspirin chewable tablet 81 mg  81 mg Oral Daily [name], [name], MD   81 mg at 09/15/20 0917  . atorvastatin (LIPITOR) tablet 40 mg  40 mg Oral Daily [name], [name], MD   40 mg at 09/15/20 0916  . busPIRone (BUSPAR) tablet 15 mg  15 mg Oral TID [name], [name], MD   15 mg at 09/15/20 1238  . clonazePAM (KLONOPIN) tablet 0.5 mg  0.5 mg Oral BID PRN [name], [name], MD   0.5 mg at 09/13/20 1550  . escitalopram (LEXAPRO) tablet 20 mg  20 mg Oral Daily [name], [name], MD   20 mg at 09/15/20 0916  . hydrochlorothiazide (HYDRODIURIL) tablet 25 mg  25 mg Oral Daily [name], [name], MD   25 mg at 09/15/20 0917  . ibuprofen (ADVIL) tablet 600 mg  600 mg Oral Q6H PRN [name], [name], MD   600 mg at 09/15/20 1428  . lisinopril (ZESTRIL) tablet 40 mg  40 mg Oral Daily Lindon Romp A, NP   40 mg at 09/15/20 0916  . magnesium hydroxide (MILK OF MAGNESIA) suspension 30 mL  30 mL Oral Daily PRN Lindon Romp A, NP      . menthol-cetylpyridinium (CEPACOL) lozenge 3 mg  1 lozenge Oral PRN [name], [name], MD   3 mg at 09/07/20 1515  . metFORMIN (GLUCOPHAGE-XR) 24 hr tablet 500 mg  500 mg Oral Q breakfast [name], [name], MD   500 mg at 09/15/20 0915  .  mometasone-formoterol (DULERA) 200-5 MCG/ACT inhaler 2 puff  2 puff Inhalation BID Lindon Romp A, NP   2 puff at 09/15/20 0917  . nicotine (NICODERM CQ - dosed in mg/24 hours) patch 21 mg  21 mg Transdermal Daily [name], [name], MD   21 mg at 09/15/20 0915  . pantoprazole (PROTONIX) EC tablet 40 mg  40 mg Oral Daily [name], [name], MD   40 mg at 09/15/20 0917  . QUEtiapine (SEROQUEL XR) 24 hr tablet 400 mg  400 mg Oral QHS [name], [name], MD   400 mg at 09/14/20 2118  . zolpidem (AMBIEN) tablet 10 mg  10 mg Oral QHS PRN [name], [name], MD   10 mg at 09/14/20 2118   PTA Medications: Medications Prior to Admission  Medication Sig Dispense Refill Last Dose  . ADVAIR DISKUS 500-50 MCG/DOSE AEPB Inhale 1 puff into the lungs every 12 (twelve) hours.     Marland Kitchen albuterol (PROVENTIL HFA;VENTOLIN HFA) 108 (90 Base) MCG/ACT inhaler Inhale 2 puffs into the lungs every 6 (six) hours as needed for wheezing or shortness of breath. 1 Inhaler 0   . aspirin 81 MG chewable tablet Chew 1 tablet (81 mg total) by mouth daily. 30 tablet 1   . atorvastatin (LIPITOR) 40 MG  tablet Take 1 tablet (40 mg total) by mouth daily at 6 PM. 7 tablet 0   . busPIRone (BUSPAR) 10 MG tablet Take 1 tablet (10 mg total) by mouth 3 (three) times daily. (Patient taking differently: Take 15 mg by mouth 3 (three) times daily. ) 90 tablet 1   . clonazePAM (KLONOPIN) 0.5 MG tablet Take 0.5 mg by mouth daily as needed for anxiety. (Patient not taking: Reported on 09/06/2020)     . cloNIDine (CATAPRES) 0.1 MG tablet Take 1 tablet (0.1 mg total) by mouth 2 (two) times daily as needed (BP Systolic > 295 or Disastolic > 284). 60 tablet 1   . escitalopram (LEXAPRO) 20 MG tablet Take 20 mg by mouth daily.     . hydrochlorothiazide (HYDRODIURIL) 25 MG tablet Take 25 mg by mouth daily.     Marland Kitchen ibuprofen (ADVIL,MOTRIN) 600 MG tablet Take 1 tablet (600 mg total) by mouth every 6 (six) hours as needed for moderate pain. 60 tablet 1   . lisinopril  (PRINIVIL,ZESTRIL) 20 MG tablet Take 1 tablet (20 mg total) by mouth daily. (Patient taking differently: Take 40 mg by mouth daily. ) 7 tablet 0   . metFORMIN (GLUCOPHAGE-XR) 500 MG 24 hr tablet Take 500 mg by mouth daily.     . mometasone-formoterol (DULERA) 200-5 MCG/ACT AERO Inhale 2 puffs into the lungs 2 (two) times daily. (Patient not taking: Reported on 10/05/2019) 1 Inhaler 1   . omeprazole (PRILOSEC) 20 MG capsule Take 20 mg by mouth daily.     . QUEtiapine (SEROQUEL XR) 300 MG 24 hr tablet Take 1 tablet (300 mg total) by mouth at bedtime. (Patient not taking: Reported on 09/06/2020) 30 tablet 1   . QUEtiapine (SEROQUEL XR) 400 MG 24 hr tablet Take 400 mg by mouth at bedtime.     . Sofosbuvir-Velpatasvir (EPCLUSA) 400-100 MG TABS Take 1 tablet by mouth daily at 6 PM. (Patient not taking: Reported on 08/25/2019) 30 tablet 0   . thiamine 100 MG tablet Take 1 tablet (100 mg total) by mouth daily. (Patient not taking: Reported on 10/05/2019) 30 tablet 1   . zolpidem (AMBIEN) 10 MG tablet Take 1 tablet (10 mg total) by mouth at bedtime. 30 tablet 1     Patient Stressors: Financial difficulties Marital or family conflict Medication change or noncompliance Substance abuse  Patient Strengths: Ability for insight Average or above average intelligence Communication skills Physical Health  Treatment Modalities: Medication Management, Group therapy, Case management,  1 to 1 session with clinician, Psychoeducation, Recreational therapy.   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  Therapeutic Interventions: 1 to 1 sessions, Unit Group sessions and Medication administration.  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[place]) Active Problems:   HTN (hypertension)   Alcohol use disorder, moderate, dependence (HCC)   Cocaine use disorder, moderate, dependence (HCC)   Generalized anxiety disorder   COPD exacerbation (HCC)  Long Term Goal(s): Improvement  in symptoms so as ready for discharge Improvement in symptoms so as ready for discharge   Short Term Goals: Ability to disclose and discuss suicidal ideas Ability to demonstrate self-control will improve Ability to maintain clinical measurements within normal limits will improve Compliance with prescribed medications will improve Ability to identify triggers associated with substance abuse/mental health issues will improve     Medication Management: Evaluate patient's response, side effects, and tolerance of medication regimen.  Therapeutic Interventions: 1 to 1 sessions, Unit Group sessions and Medication administration.  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[place]) Long Term Goal(s): Knowledge of disease and therapeutic regimen to maintain health will improve  Short Term Goals: Ability to demonstrate self-control, Ability to participate in decision making will improve, Ability to verbalize feelings will improve, Ability to disclose and discuss suicidal ideas, Ability to identify and develop effective coping behaviors will improve and Compliance with prescribed medications will improve  Medication Management: RN will administer medications as ordered by  provider, will assess and evaluate patient's response and provide education to patient for prescribed medication. RN will report any adverse and/or side effects to prescribing provider.  Therapeutic Interventions: 1 on 1 counseling sessions, Psychoeducation, Medication administration, Evaluate responses to treatment, Monitor vital signs and CBGs as ordered, Perform/monitor CIWA, COWS, AIMS and Fall Risk screenings as ordered, Perform wound care treatments as ordered.  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  Short Term Goals: Engage patient in aftercare planning with referrals and resources, Increase social support, Identify triggers associated with mental health/substance abuse issues and Increase skills for wellness and recovery  Therapeutic Interventions: Assess for all discharge needs, 1 to 1 time with Social worker, Explore available resources and support systems, Assess for adequacy in community support network, Educate family and significant other(s) on suicide prevention, Complete Psychosocial Assessment, Interpersonal group therapy.  Evaluation of Outcomes: Progressing   Progress in Treatment: Attending groups: Yes. Participating in groups: Yes. Taking medication as prescribed: Yes. Toleration medication: Yes. Family/Significant other contact made: Yes, individual(s) contacted:  SPE completed  Patient understands diagnosis: Yes. Discussing patient identified problems/goals with staff: Yes. Medical problems stabilized or resolved: Yes. Denies suicidal/homicidal ideation: Yes. Issues/concerns per patient self-inventory: No. Other: N/a   New problem(s) identified: No, Describe:  None   New Short Term/Long Term Goal(s): Elimination of symptoms of  psychosis, medication management for mood stabilization; development of comprehensive mental wellness plan.   Patient Goals:  "get back on my meds and get some type of quality of living when I get out, it's been a lot building up that I don't know what to do about it" Update 09/15/20: No changes at this time  Discharge Plan or Barriers: CSW will assist patient in developing discharge plans. Pt has asked for assistance with light bill and lot rent.  CSW has asked patient for copies of invoices so that they can be provided to charitable foundation for further assesing.  At this time the patient has not been able to do so. Update 09/15/20: No changes at this time  Reason for Continuation of Hospitalization: Anxiety Depression Medication stabilization Suicidal ideation  Estimated Length of Stay: 1-7 days   Attendees: Patient:  09/15/2020 3:24 PM  Physician: Dr. Daneil Dolin, MD 09/15/2020 3:24 PM  Nursing:  09/15/2020  3:24 PM  RN Care Manager: 09/15/2020 3:24 PM  Social Worker: Raina Mina, Latanya Presser 09/15/2020 3:24 PM  Recreational Therapist:  09/15/2020 3:24 PM  Other:  09/15/2020 3:24 PM  Other:  09/15/2020 3:24 PM  Other: 09/15/2020 3:24 PM    Scribe for Treatment Team: Raina Mina, [place] 09/15/2020 3:24 PM

## 2020-09-15 NOTE — [facility] Group Notes (Signed)
LCSW Aftercare Discharge Planning Group Note   09/15/2020 1:00-1:45 PM   Type of Group and Topic: Psychoeducational Group:  Discharge Planning  Participation Level:  Active  Description of Group  Discharge planning group reviews patient's anticipated discharge plans and assists patients to anticipate and address any barriers to wellness/recovery in the community.  Suicide prevention education is reviewed with patients in group.  Therapeutic Goals 1. Patients will state their anticipated discharge plan and mental health aftercare 2. Patients will identify potential barriers to wellness in the community setting 3. Patients will engage in problem solving, solution focused discussion of ways to anticipate and address barriers to wellness/recovery  Summary of Patient Progress: Patient stated that he plans on getting out of the hospital next week. Patient stated he plans on returning to his current home. Patient stated that he did not tell some of his family about him being in the hospital out of fear of not seeing his grandchildren. Patient spoke about his strained relationship with his children. Patient stated he will take it one day at a time when he is discharged from the hospital.      Therapeutic Modalities: [place], [place] 09/15/2020 4:18 PM

## 2020-09-15 NOTE — Progress Notes (Signed)
Patient is pleasant, calm and cooperative. Denies SI, HI and AVH. Interacting well with staff and peers. No complaints

## 2020-09-15 NOTE — Plan of Care (Signed)
Out in the milieu, pleasant and cooperative. Alert and oriented and denying thoughts of self harm. Denying hallucinations. Reports that medications are working well and appetite is good. No problem with sleeping. Reported that his only concern is about getting his house reorganized before he moves back in. Reports that he will not be ready to get the house clean  before next week when he received his check. Patient is otherwise cooperative with no additional concerns.

## 2020-09-16 NOTE — Progress Notes (Signed)
[facility] MD Progress Note  09/16/2020 9:56 AM Ian [name]  MRN:  [id number] Subjective:  09/16/2020 Patient shares that he fell asleep okay, but then was not able to maintain sleep throughout the night due to a dream.  Does not feel tired.  Says that he plans to rest this morning and then looks forward to watching football this afternoon.  Depression is a 4 out of 10 and anxiety is a 5 out of 10.  Denies thoughts of suicide.  He ended up taking as needed Klonopin yesterday, a flareup of anxiety due to thoughts of future plans.  No new medical issues.  No challenges on the unit.  09/15/2020 Ian [name] is being seen for the first time today by this provider.  Chart notes reviewed.  He was seen for initial psychiatric evaluation on September 06, 2020.  He reviews the events that had transpired, particularly financial problems, and especially regarding his trailer.  Says that it is "trashed" with pest infestation that has been going on since his mother died 5 years ago; he got depressed when she died, "she was my world."  Says that he has been slowly giving up hope and it became worse around May to June of this year.  He contemplated suicide but decided to get help instead. Patient does report feeling better since his admission and is tolerating his medications well.  Shares that he not taking his medications 1-1/2 weeks ago, besides his Ambien and Seroquel.  Depression is a 6 out of 10 with 10 being high.  Anxiety is a 5 out of 10.  He did sleep well last night.  No new medical issues.  Importantly, he found out yesterday afternoon that the city of [place] posted a notice on his door that his trailer is not to be occupied until all of the trash is removed from the premises.  He has 30 days to do this.  States that he felt the pressure of completing this yesterday which contributed to elevated depression yesterday. 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 Anxiety   . Barrett esophagus   . Cancer ([place])   . Coronary artery disease   . Depression   . GERD (gastroesophageal reflux disease)   . Hypertension   . Pre-diabetes   . Stroke [facility]) 2014   "mini-stroke" per patient    Past Surgical History:  Procedure Laterality Date  . ANGIOPLASTY    . APPENDECTOMY    . CARDIAC CATHETERIZATION    . CHOLECYSTECTOMY    . WRIST SURGERY Right    age 34   Family History:  Family History  Problem Relation Age of Onset  . Stroke Father   . Leukemia Father   . Breast cancer Sister    Family Psychiatric  History: See previous Social History:  Social History   Substance and Sexual Activity  Alcohol Use Yes     Social History   Substance and Sexual Activity  Drug  Use Yes  . Types: Cocaine, Marijuana    Social History   Socioeconomic History  . Marital status: Divorced    Spouse name: Not on file  . Number of children: Not on file  . Years of education: Not on file  . Highest education level: Not on file  Occupational History  . Not on file  Tobacco Use  . Smoking status: Current Every Day Smoker    Packs/day: 1.00    Types: Cigarettes  . Smokeless tobacco: Never Used  Vaping Use  . Vaping Use: Never used  Substance and Sexual Activity  . Alcohol use: Yes  . Drug use: Yes    Types: Cocaine, Marijuana  . Sexual activity: Not on file  Other Topics Concern  . Not on file  Social History  Narrative  . Not on file   Social Determinants of Health   Financial Resource Strain:   . Difficulty of Paying Living Expenses: Not on file  Food Insecurity:   . Worried About Charity fundraiser in the Last Year: Not on file  . Ran Out of Food in the Last Year: Not on file  Transportation Needs:   . Lack of Transportation (Medical): Not on file  . Lack of Transportation (Non-Medical): Not on file  Physical Activity:   . Days of Exercise per Week: Not on file  . Minutes of Exercise per Session: Not on file  Stress:   . Feeling of Stress : Not on file  Social Connections:   . Frequency of Communication with Friends and Family: Not on file  . Frequency of Social Gatherings with Friends and Family: Not on file  . Attends Religious Services: Not on file  . Active Member of Clubs or Organizations: Not on file  . Attends Archivist Meetings: Not on file  . Marital Status: Not on file   Additional Social History:                         Sleep: Fair  Appetite:  Fair  Current Medications: Current Facility-Administered Medications  Medication Dose Route Frequency Provider Last Rate Last Admin  . alum & mag hydroxide-simeth (MAALOX/MYLANTA) 200-200-20 MG/5ML suspension 30 mL  30 mL Oral Q4H PRN Lindon Romp A, NP   30 mL at 09/08/20 1232  . aspirin chewable tablet 81 mg  81 mg Oral Daily [name], [name], MD   81 mg at 09/16/20 0805  . atorvastatin (LIPITOR) tablet 40 mg  40 mg Oral Daily [name], [name], MD   40 mg at 09/16/20 0805  . busPIRone (BUSPAR) tablet 15 mg  15 mg Oral TID [name], [name], MD   15 mg at 09/16/20 0805  . clonazePAM (KLONOPIN) tablet 0.5 mg  0.5 mg Oral BID PRN [name], [name], MD   0.5 mg at 09/15/20 1933  . escitalopram (LEXAPRO) tablet 20 mg  20 mg Oral Daily [name], [name], MD   20 mg at 09/16/20 0805  . hydrochlorothiazide (HYDRODIURIL) tablet 25 mg  25 mg Oral Daily [name], [name], MD   25 mg at 09/16/20 0805  . ibuprofen (ADVIL)  tablet 600 mg  600 mg Oral Q6H PRN [name], [name], MD   600 mg at 09/15/20 1428  . lisinopril (ZESTRIL) tablet 40 mg  40 mg Oral Daily Lindon Romp A, NP   40 mg at 09/16/20 0805  . magnesium hydroxide (MILK OF MAGNESIA) suspension 30 mL  30  mL Oral Daily PRN Lindon Romp A, NP      . menthol-cetylpyridinium (CEPACOL) lozenge 3 mg  1 lozenge Oral PRN [name], [name], MD   3 mg at 09/07/20 1515  . metFORMIN (GLUCOPHAGE-XR) 24 hr tablet 500 mg  500 mg Oral Q breakfast [name], [name], MD   500 mg at 09/16/20 0805  . mometasone-formoterol (DULERA) 200-5 MCG/ACT inhaler 2 puff  2 puff Inhalation BID Lindon Romp A, NP   2 puff at 09/16/20 0806  . nicotine (NICODERM CQ - dosed in mg/24 hours) patch 21 mg  21 mg Transdermal Daily [name], [name], MD   21 mg at 09/16/20 0805  . pantoprazole (PROTONIX) EC tablet 40 mg  40 mg Oral Daily [name], [name], MD   40 mg at 09/16/20 0805  . QUEtiapine (SEROQUEL XR) 24 hr tablet 400 mg  400 mg Oral QHS [name], [name], MD   400 mg at 09/15/20 2116  . zolpidem (AMBIEN) tablet 10 mg  10 mg Oral QHS PRN [name], [name], MD   10 mg at 09/15/20 2117    Lab Results: No results found for this or any previous visit (from the past 48 hour(s)).  Blood Alcohol level:  Lab Results  Component Value Date   ETH 137 (H) 09/05/2020   ETH <10 17/79/3903    Metabolic Disorder Labs: Lab Results  Component Value Date   HGBA1C 7.4 (H) 08/26/2019   MPG 165.68 08/26/2019   MPG 136.98 12/13/2018   No results found for: PROLACTIN Lab Results  Component Value Date   CHOL 178 08/26/2019   TRIG 375 (H) 08/26/2019   HDL 23 (L) 08/26/2019   CHOLHDL 7.7 08/26/2019   VLDL 75 (H) 08/26/2019   LDLCALC 80 08/26/2019   LDLCALC 70 12/13/2018    Physical Findings: AIMS:  , ,  ,  ,    CIWA:  CIWA-Ar Total: 0 COWS:     Musculoskeletal: Strength & Muscle Tone: within normal limits Gait & Station: normal Patient leans: N/A  Psychiatric Specialty Exam: Physical Exam Vitals  and nursing note reviewed.  Constitutional:      Appearance: He is well-developed.  HENT:     Head: Normocephalic and atraumatic.  Eyes:     Conjunctiva/sclera: Conjunctivae normal.     Pupils: Pupils are equal, round, and reactive to light.  Cardiovascular:     Heart sounds: Normal heart sounds.  Pulmonary:     Effort: Pulmonary effort is normal.  Abdominal:     Palpations: Abdomen is soft.  Musculoskeletal:        General: Normal range of motion.     Cervical back: Normal range of motion.  Skin:    General: Skin is warm and dry.  Neurological:     General: No focal deficit present.     Mental Status: He is alert.  Psychiatric:        Attention and Perception: Attention normal.        Mood and Affect: Mood normal.        Speech: Speech normal.        Behavior: Behavior is not aggressive. Behavior is cooperative.        Thought Content: Thought content does not include homicidal or suicidal ideation.        Cognition and Memory: Memory normal.     Review of Systems  Constitutional: Negative.   HENT: Negative.   Eyes: Negative.   Respiratory: Negative.   Cardiovascular: Negative.  Gastrointestinal: Negative.   Musculoskeletal: Negative.   Skin: Negative.   Neurological: Negative.   Psychiatric/Behavioral: Positive for dysphoric mood. Negative for suicidal ideas. The patient is nervous/anxious.     Blood pressure 138/80, pulse 91, temperature 97.6 F (36.4 C), temperature source Oral, resp. rate 14, SpO2 97 %.There is no height or weight on file to calculate BMI.  General Appearance: Casual  Eye Contact:  good  Speech:  Normal Rate  Volume:  Normal  Mood:  Mildly Depressed  /Affect:  neutral  Thought Process:  Linear  Orientation:  Full (Time, Place, and Person)  Thought Content:  Logical  Suicidal Thoughts:  No  Homicidal Thoughts:  No  Memory:  Immediate;   Fair Recent;   Fair Remote;   Fair  Judgement:  Fair  Insight:  Fair  Psychomotor Activity:  Normal   Concentration:  Concentration: Fair  Recall:  [place] of Knowledge:  good  Language:  Fair  Akathisia:  No  Handed:  Right  AIMS (if indicated):     Assets:  Desire for Improvement Housing Physical Health Social Support  ADL's:  Intact  Cognition:  WNL  Sleep:  Number of Hours: 5     Treatment Plan Summary: Daily contact with patient to assess and evaluate symptoms and progress in treatment, Medication management and Plan No change to current medication.  Supportive counseling.  Encourage his group attendance and social activity.  Possible discharge by the first day or 2 of next week    09/15/2020 No changes Pt discussed with staff and nursing No signs of EPS, NMS  09/16/2020 No changes   Rulon Sera, MD 09/16/2020, 9:56 AM

## 2020-09-16 NOTE — Progress Notes (Signed)
Patient has been pleasant and cooperative. Denies SI, HI or AVH. Voices no complaints. Contracting for safety

## 2020-09-16 NOTE — Plan of Care (Signed)
Patient stayed in bed this morning reporting that he did not sleep well last night. Also complained of necks and low back pain and requested Ibuprofen. Continues to express anxiety related to upcoming discharge, reporting that his house is not ready. Patient ate his meals and received medications as scheduled. Emotional support provided. Was encouraged to discuss discharge plan with MD/case managements. Safety monitored as expected.

## 2020-09-16 NOTE — [facility] Group Notes (Signed)
[facility] Notes: (Clinical Social Work)   09/16/2020      Type of Therapy:  Group Therapy   Participation Level:  Did Not Attend - was invited individually by Nurse/MHT and chose not to attend.   Raina Mina, [place] 09/16/2020  3:42 PM

## 2020-09-16 NOTE — Plan of Care (Signed)
°  Problem: Education: Goal: Knowledge of [facility] Education information/materials will improve Outcome: Progressing   Problem: Health Behavior/Discharge Planning: Goal: Identification of resources available to assist in meeting health care needs will improve Outcome: Progressing Goal: Compliance with treatment plan for underlying cause of condition will improve Outcome: Progressing   Problem: Physical Regulation: Goal: Ability to maintain clinical measurements within normal limits will improve Outcome: Progressing

## 2020-09-17 NOTE — Progress Notes (Signed)
Patient complained of anxiety and pain 8/10 at shift change. Awake at 4am. Says he is nervous about discharge. Denies SI, HI and AVH

## 2020-09-17 NOTE — Progress Notes (Signed)
Recreation Therapy Notes   Date: 09/17/2020  Time: 9:30 am   Location: [place]   Behavioral response: Appropriate  Intervention Topic: Social Skills   Discussion/Intervention:  Group content on today was focused on social skills. The group defined social skills and identified ways they use social skills. Patients expressed what obstacles they face when trying to be social. Participants described the importance of social skills. The group listed ways to improve social skills and reasons to improve social skills. Individuals had an opportunity to learn new and improve social skills as well as identify their weaknesses.  Clinical Observations/Feedback: Patient came to group late and was able to focus and expresses their thoughts in a positive social manner. Individual was social with peers and staff while participating in the intervention. Participant left group early due to unknown reasons and did not return.   Roselyn Doby LRT/CTRS         Maiko Salais 09/17/2020 12:21 PM

## 2020-09-17 NOTE — Progress Notes (Signed)
[facility] MD Progress Note  09/17/2020 2:04 PM Ian [name]  MRN:  [id number] Subjective: Follow-up for this patient with chronic severe depression and anxiety.  Patient still jittery most days.  Still requiring as needed's to control his anxiety.  Passive suicidal thoughts.  No specific new physical complaints.  Feeling slightly more optimistic today because he should be able to get back into his trailer soon Principal Problem: Severe recurrent major depression ([place]) Diagnosis: Principal Problem:   Severe recurrent major depression ([place]) Active Problems:   HTN (hypertension)   Alcohol use disorder, moderate, dependence (HCC)   Cocaine use disorder, moderate, dependence (HCC)   Generalized anxiety disorder   COPD exacerbation ([place])  Total Time spent with patient: 30 minutes  Past Psychiatric History: Past history of multiple prior hospitalizations longstanding anxiety  Past Medical History:  Past Medical History:  Diagnosis Date  . Anxiety   . Barrett esophagus   . Cancer ([place])   . Coronary artery disease   . Depression   . GERD (gastroesophageal reflux disease)   . Hypertension   . Pre-diabetes   . Stroke [facility]) 2014   "mini-stroke" per patient    Past Surgical History:  Procedure Laterality Date  . ANGIOPLASTY    . APPENDECTOMY    . CARDIAC CATHETERIZATION    . CHOLECYSTECTOMY    . WRIST SURGERY Right    age 59   Family History:  Family History  Problem Relation Age of Onset  . Stroke Father   . Leukemia Father   . Breast cancer Sister    Family Psychiatric  History: See previous Social History:  Social History   Substance and Sexual Activity  Alcohol Use Yes     Social History   Substance and Sexual Activity  Drug Use Yes  . Types: Cocaine, Marijuana    Social History   Socioeconomic History  . Marital status: Divorced    Spouse name: Not on file  . Number of children: Not on file  . Years of education: Not on file  . Highest education level: Not on  file  Occupational History  . Not on file  Tobacco Use  . Smoking status: Current Every Day Smoker    Packs/day: 1.00    Types: Cigarettes  . Smokeless tobacco: Never Used  Vaping Use  . Vaping Use: Never used  Substance and Sexual Activity  . Alcohol use: Yes  . Drug use: Yes    Types: Cocaine, Marijuana  . Sexual activity: Not on file  Other Topics Concern  . Not on file  Social History Narrative  . Not on file   Social Determinants of Health   Financial Resource Strain:   . Difficulty of Paying Living Expenses: Not on file  Food Insecurity:   . Worried About Charity fundraiser in the Last Year: Not on file  . Ran Out of Food in the Last Year: Not on file  Transportation Needs:   . Lack of Transportation (Medical): Not on file  . Lack of Transportation (Non-Medical): Not on file  Physical Activity:   . Days of Exercise per Week: Not on file  . Minutes of Exercise per Session: Not on file  Stress:   . Feeling of Stress : Not on file  Social Connections:   . Frequency of Communication with Friends and Family: Not on file  . Frequency of Social Gatherings with Friends and Family: Not on file  . Attends Religious Services: Not on file  .  Active Member of Clubs or Organizations: Not on file  . Attends Archivist Meetings: Not on file  . Marital Status: Not on file   Additional Social History:                         Sleep: Fair  Appetite:  Negative  Current Medications: Current Facility-Administered Medications  Medication Dose Route Frequency Provider Last Rate Last Admin  . alum & mag hydroxide-simeth (MAALOX/MYLANTA) 200-200-20 MG/5ML suspension 30 mL  30 mL Oral Q4H PRN Lindon Romp A, NP   30 mL at 09/08/20 1232  . aspirin chewable tablet 81 mg  81 mg Oral Daily Milda Lindvall, [name], MD   81 mg at 09/17/20 0756  . atorvastatin (LIPITOR) tablet 40 mg  40 mg Oral Daily Marvell Tamer, [name], MD   40 mg at 09/17/20 0755  . busPIRone (BUSPAR) tablet 15  mg  15 mg Oral TID Quenna Doepke, [name], MD   15 mg at 09/17/20 1156  . clonazePAM (KLONOPIN) tablet 0.5 mg  0.5 mg Oral BID PRN Josephanthony Tindel, [name], MD   0.5 mg at 09/17/20 1035  . escitalopram (LEXAPRO) tablet 20 mg  20 mg Oral Daily Flara Storti T, MD   20 mg at 09/17/20 0756  . hydrochlorothiazide (HYDRODIURIL) tablet 25 mg  25 mg Oral Daily Avanell Banwart T, MD   25 mg at 09/17/20 0800  . ibuprofen (ADVIL) tablet 600 mg  600 mg Oral Q6H PRN Derry Kassel, [name], MD   600 mg at 09/17/20 0801  . lisinopril (ZESTRIL) tablet 40 mg  40 mg Oral Daily Lindon Romp A, NP   40 mg at 09/17/20 9518  . magnesium hydroxide (MILK OF MAGNESIA) suspension 30 mL  30 mL Oral Daily PRN Lindon Romp A, NP      . menthol-cetylpyridinium (CEPACOL) lozenge 3 mg  1 lozenge Oral PRN Rockey Guarino, [name], MD   3 mg at 09/07/20 1515  . metFORMIN (GLUCOPHAGE-XR) 24 hr tablet 500 mg  500 mg Oral Q breakfast Lenin Kuhnle, [name], MD   500 mg at 09/17/20 0756  . mometasone-formoterol (DULERA) 200-5 MCG/ACT inhaler 2 puff  2 puff Inhalation BID Lindon Romp A, NP   2 puff at 09/17/20 0756  . nicotine (NICODERM CQ - dosed in mg/24 hours) patch 21 mg  21 mg Transdermal Daily Allee Busk, [name], MD   21 mg at 09/17/20 0802  . pantoprazole (PROTONIX) EC tablet 40 mg  40 mg Oral Daily Cervando Durnin, [name], MD   40 mg at 09/17/20 0756  . QUEtiapine (SEROQUEL XR) 24 hr tablet 400 mg  400 mg Oral QHS Lakita Sahlin T, MD   400 mg at 09/16/20 2106  . zolpidem (AMBIEN) tablet 10 mg  10 mg Oral QHS PRN Kaye Luoma, [name], MD   10 mg at 09/16/20 2106    Lab Results: No results found for this or any previous visit (from the past 48 hour(s)).  Blood Alcohol level:  Lab Results  Component Value Date   ETH 137 (H) 09/05/2020   ETH <10 84/16/6063    Metabolic Disorder Labs: Lab Results  Component Value Date   HGBA1C 7.4 (H) 08/26/2019   MPG 165.68 08/26/2019   MPG 136.98 12/13/2018   No results found for: PROLACTIN Lab Results  Component Value Date   CHOL 178  08/26/2019   TRIG 375 (H) 08/26/2019   HDL 23 (L) 08/26/2019   CHOLHDL 7.7 08/26/2019  VLDL 75 (H) 08/26/2019   LDLCALC 80 08/26/2019   LDLCALC 70 12/13/2018    Physical Findings: AIMS:  , ,  ,  ,    CIWA:  CIWA-Ar Total: 0 COWS:     Musculoskeletal: Strength & Muscle Tone: within normal limits Gait & Station: normal Patient leans: N/A  Psychiatric Specialty Exam: Physical Exam Vitals and nursing note reviewed.  Constitutional:      Appearance: He is well-developed.  HENT:     Head: Normocephalic and atraumatic.  Eyes:     Conjunctiva/sclera: Conjunctivae normal.     Pupils: Pupils are equal, round, and reactive to light.  Cardiovascular:     Heart sounds: Normal heart sounds.  Pulmonary:     Effort: Pulmonary effort is normal.  Abdominal:     Palpations: Abdomen is soft.  Musculoskeletal:        General: Normal range of motion.     Cervical back: Normal range of motion.  Skin:    General: Skin is warm and dry.  Neurological:     General: No focal deficit present.     Mental Status: He is alert.  Psychiatric:        Attention and Perception: Attention normal.        Mood and Affect: Mood is anxious and depressed.        Speech: Speech normal.        Behavior: Behavior is withdrawn.        Thought Content: Thought content includes suicidal ideation. Thought content does not include suicidal plan.        Cognition and Memory: Cognition is impaired.        Judgment: Judgment is impulsive.     Review of Systems  Constitutional: Negative.   HENT: Negative.   Eyes: Negative.   Respiratory: Negative.   Cardiovascular: Negative.   Gastrointestinal: Negative.   Musculoskeletal: Negative.   Skin: Negative.   Neurological: Negative.   Psychiatric/Behavioral: Positive for dysphoric mood and suicidal ideas. The patient is nervous/anxious.     Blood pressure 119/84, pulse 97, temperature 97.6 F (36.4 C), temperature source Oral, resp. rate 17, SpO2 98 %.There is  no height or weight on file to calculate BMI.  General Appearance: Casual  Eye Contact:  Fair  Speech:  Clear and Coherent  Volume:  Normal  Mood:  Anxious and Depressed  Affect:  Congruent  Thought Process:  Coherent  Orientation:  Full (Time, Place, and Person)  Thought Content:  Rumination and Tangential  Suicidal Thoughts:  No  Homicidal Thoughts:  No  Memory:  Immediate;   Fair Recent;   Poor Remote;   Poor  Judgement:  Fair  Insight:  Fair  Psychomotor Activity:  Decreased  Concentration:  Concentration: Fair  Recall:  [employer] of Knowledge:  Fair  Language:  Fair  Akathisia:  No  Handed:  Right  AIMS (if indicated):     Assets:  Desire for Improvement  ADL's:  Impaired  Cognition:  Impaired,  Mild  Sleep:  Number of Hours: 5.75     Treatment Plan Summary: Daily contact with patient to assess and evaluate symptoms and progress in treatment, Medication management and Plan No change to medication.  Supportive counseling.  Review of overall treatment plan.  Hope for discharge within a couple days  Alethia Berthold, MD 09/17/2020, 2:04 PM

## 2020-09-17 NOTE — Progress Notes (Signed)
D: Pt alert and oriented. Pt rates depression 7/10, hopelessness 7/10, and anxiety 5/10. Pt reports energy level as normal and concentration as being good. Pt reports sleep last night as being poor. Pt did receive medications for sleep and did not find them helpful. Pt reports experiencing 8/10 back pain, prn meds given. Pt denies experiencing any SI/HI, or AVH at this time. Pt is still very anxious about living situation and things that need to take place in order for him to keep his place of residency.   A: Scheduled medications administered to pt, per MD orders. Support and encouragement provided. Frequent verbal contact made. Routine safety checks conducted q15 minutes.   R: No adverse drug reactions noted. Pt verbally contracts for safety at this time. Pt complaint with medications. Pt interacts well with others on the unit. Pt remains safe at this time. Will continue to monitor.

## 2020-09-17 NOTE — Plan of Care (Signed)
  Problem: Education: Goal: Knowledge of [facility] Education information/materials will improve Outcome: Progressing   Problem: Health Behavior/Discharge Planning: Goal: Identification of resources available to assist in meeting health care needs will improve Outcome: Progressing Goal: Compliance with treatment plan for underlying cause of condition will improve Outcome: Progressing   Problem: Safety: Goal: Periods of time without injury will increase Outcome: Progressing

## 2020-09-18 NOTE — Plan of Care (Signed)
Patient is appropriate with staff & peers.Interacting well with staff & peers.Patient stated that his anxiety and depression is getting better.Patient stated that he had a disturbed sleep.Denies SI,HI and AVH.Attended groups.Appetite and energy level good.Support and encouragement given.

## 2020-09-18 NOTE — Progress Notes (Signed)
[facility] MD Progress Note  09/18/2020 11:41 AM Ian [name]  MRN:  [id number] Subjective: Follow-up this patient with chronic anxiety and depression.  No new complaints.  Anxiety fairly stable.  Denies any current suicidal thinking. 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 Anxiety   . Barrett esophagus   . Cancer ([place])   . Coronary artery disease   . Depression   . GERD (gastroesophageal reflux disease)   . Hypertension   . Pre-diabetes   . Stroke [facility]) 2014   "mini-stroke" per patient    Past Surgical History:  Procedure Laterality Date  . ANGIOPLASTY    . APPENDECTOMY    . CARDIAC CATHETERIZATION    . CHOLECYSTECTOMY    . WRIST SURGERY Right    age 58   Family History:  Family History  Problem Relation Age of Onset  . Stroke Father   . Leukemia Father   . Breast cancer Sister    Family Psychiatric  History: See previous Social History:  Social History   Substance and Sexual Activity  Alcohol Use Yes     Social History   Substance and Sexual Activity  Drug Use Yes  . Types: Cocaine, Marijuana    Social History   Socioeconomic History  . Marital status: Divorced    Spouse name: Not on file  . Number of children: Not on file  . Years of education: Not on file  . Highest education level: Not on file  Occupational History  . Not on file  Tobacco Use  . Smoking status: Current Every Day Smoker    Packs/day: 1.00    Types: Cigarettes  . Smokeless tobacco: Never Used  Vaping Use  .  Vaping Use: Never used  Substance and Sexual Activity  . Alcohol use: Yes  . Drug use: Yes    Types: Cocaine, Marijuana  . Sexual activity: Not on file  Other Topics Concern  . Not on file  Social History Narrative  . Not on file   Social Determinants of Health   Financial Resource Strain:   . Difficulty of Paying Living Expenses: Not on file  Food Insecurity:   . Worried About Charity fundraiser in the Last Year: Not on file  . Ran Out of Food in the Last Year: Not on file  Transportation Needs:   . Lack of Transportation (Medical): Not on file  . Lack of Transportation (Non-Medical): Not on file  Physical Activity:   . Days of Exercise per Week: Not on file  . Minutes of Exercise per Session: Not on file  Stress:   . Feeling of Stress : Not on file  Social Connections:   . Frequency of Communication with Friends and Family: Not on file  . Frequency of Social Gatherings with Friends and Family: Not on file  . Attends Religious Services: Not on file  . Active Member of Clubs or Organizations: Not on file  . Attends Archivist Meetings: Not on file  . Marital Status: Not on file   Additional Social History:  Sleep: Fair  Appetite:  Fair  Current Medications: Current Facility-Administered Medications  Medication Dose Route Frequency Provider Last Rate Last Admin  . alum & mag hydroxide-simeth (MAALOX/MYLANTA) 200-200-20 MG/5ML suspension 30 mL  30 mL Oral Q4H PRN Lindon Romp A, NP   30 mL at 09/08/20 1232  . aspirin chewable tablet 81 mg  81 mg Oral Daily Rylee Huestis, [name], MD   81 mg at 09/18/20 0759  . atorvastatin (LIPITOR) tablet 40 mg  40 mg Oral Daily Coady Train, [name], MD   40 mg at 09/18/20 0759  . busPIRone (BUSPAR) tablet 15 mg  15 mg Oral TID Nafisah Runions, [name], MD   15 mg at 09/18/20 0759  . clonazePAM (KLONOPIN) tablet 0.5 mg  0.5 mg Oral BID PRN Aldona Bryner, [name], MD   0.5 mg at 09/17/20 1035  . escitalopram (LEXAPRO)  tablet 20 mg  20 mg Oral Daily Thomson Herbers T, MD   20 mg at 09/18/20 0759  . hydrochlorothiazide (HYDRODIURIL) tablet 25 mg  25 mg Oral Daily Gabrien Mentink, [name], MD   25 mg at 09/18/20 0759  . ibuprofen (ADVIL) tablet 600 mg  600 mg Oral Q6H PRN Laurenashley Viar, [name], MD   600 mg at 09/17/20 1638  . lisinopril (ZESTRIL) tablet 40 mg  40 mg Oral Daily Lindon Romp A, NP   40 mg at 09/18/20 0759  . magnesium hydroxide (MILK OF MAGNESIA) suspension 30 mL  30 mL Oral Daily PRN Lindon Romp A, NP      . menthol-cetylpyridinium (CEPACOL) lozenge 3 mg  1 lozenge Oral PRN Varvara Legault, [name], MD   3 mg at 09/07/20 1515  . metFORMIN (GLUCOPHAGE-XR) 24 hr tablet 500 mg  500 mg Oral Q breakfast Ferrah Panagopoulos, [name], MD   500 mg at 09/18/20 0804  . mometasone-formoterol (DULERA) 200-5 MCG/ACT inhaler 2 puff  2 puff Inhalation BID Lindon Romp A, NP   2 puff at 09/18/20 0804  . nicotine (NICODERM CQ - dosed in mg/24 hours) patch 21 mg  21 mg Transdermal Daily Dashayla Theissen T, MD   21 mg at 09/18/20 0800  . pantoprazole (PROTONIX) EC tablet 40 mg  40 mg Oral Daily Chiara Coltrin, [name], MD   40 mg at 09/18/20 0759  . QUEtiapine (SEROQUEL XR) 24 hr tablet 400 mg  400 mg Oral QHS Sheletha Bow T, MD   400 mg at 09/17/20 2116  . zolpidem (AMBIEN) tablet 10 mg  10 mg Oral QHS PRN Jarek Longton, [name], MD   10 mg at 09/17/20 2116    Lab Results: No results found for this or any previous visit (from the past 48 hour(s)).  Blood Alcohol level:  Lab Results  Component Value Date   ETH 137 (H) 09/05/2020   ETH <10 82/50/5397    Metabolic Disorder Labs: Lab Results  Component Value Date   HGBA1C 7.4 (H) 08/26/2019   MPG 165.68 08/26/2019   MPG 136.98 12/13/2018   No results found for: PROLACTIN Lab Results  Component Value Date   CHOL 178 08/26/2019   TRIG 375 (H) 08/26/2019   HDL 23 (L) 08/26/2019   CHOLHDL 7.7 08/26/2019   VLDL 75 (H) 08/26/2019   LDLCALC 80 08/26/2019   LDLCALC 70 12/13/2018    Physical Findings: AIMS:   , ,  ,  ,    CIWA:  CIWA-Ar Total: 0 COWS:     Musculoskeletal: Strength & Muscle Tone: within normal limits Gait & Station: normal Patient leans:  N/A  Psychiatric Specialty Exam: Physical Exam Vitals and nursing note reviewed.  Constitutional:      Appearance: He is well-developed.  HENT:     Head: Normocephalic and atraumatic.  Eyes:     Conjunctiva/sclera: Conjunctivae normal.     Pupils: Pupils are equal, round, and reactive to light.  Cardiovascular:     Heart sounds: Normal heart sounds.  Pulmonary:     Effort: Pulmonary effort is normal.  Abdominal:     Palpations: Abdomen is soft.  Musculoskeletal:        General: Normal range of motion.     Cervical back: Normal range of motion.  Skin:    General: Skin is warm and dry.  Neurological:     General: No focal deficit present.     Mental Status: He is alert.  Psychiatric:        Mood and Affect: Mood normal.        Thought Content: Thought content normal.     Review of Systems  Constitutional: Negative.   HENT: Negative.   Eyes: Negative.   Respiratory: Negative.   Cardiovascular: Negative.   Gastrointestinal: Negative.   Musculoskeletal: Negative.   Skin: Negative.   Neurological: Negative.   Psychiatric/Behavioral: Negative.     Blood pressure (!) 157/90, pulse 93, temperature 97.6 F (36.4 C), temperature source Oral, resp. rate 17, SpO2 97 %.There is no height or weight on file to calculate BMI.  General Appearance: Casual  Eye Contact:  Good  Speech:  Clear and Coherent  Volume:  Normal  Mood:  Euthymic  Affect:  Congruent  Thought Process:  Coherent  Orientation:  Full (Time, Place, and Person)  Thought Content:  Logical  Suicidal Thoughts:  No  Homicidal Thoughts:  No  Memory:  Immediate;   Fair Recent;   Fair Remote;   Fair  Judgement:  Fair  Insight:  Fair  Psychomotor Activity:  Normal  Concentration:  Concentration: Fair  Recall:  [employer] of Knowledge:  Fair  Language:  Fair   Akathisia:  No  Handed:  Right  AIMS (if indicated):     Assets:  Desire for Improvement  ADL's:  Intact  Cognition:  WNL  Sleep:  Number of Hours: 7.5     Treatment Plan Summary: Daily contact with patient to assess and evaluate symptoms and progress in treatment, Medication management and Plan No change to medicine.  Supportive therapy and encouragement.  He reminded me to look into what needs to happen to preauthorize his clonazepam at discharge  Alethia Berthold, MD 09/18/2020, 11:41 AM

## 2020-09-18 NOTE — Plan of Care (Signed)
  Problem: Group Participation Goal: STG - Patient will engage in groups without prompting or encouragement from LRT x3 group sessions within 5 recreation therapy group sessions Description: STG - Patient will engage in groups without prompting or encouragement from LRT x3 group sessions within 5 recreation therapy group sessions Outcome: Not Progressing   

## 2020-09-18 NOTE — Progress Notes (Signed)
Patient ID: Ian [name], male   DOB: 18-Apr-1961, 59 y.o.   MRN: [id number] Follow-up regarding prior approval for clonazepam. I spoke with the call center for [place] Medicaid approval. Confirmed that the patient was active under their management. They advised me that regular oral clonazepam 0.5 mg did not require a prior approval. My interaction code was [id number].

## 2020-09-18 NOTE — Plan of Care (Signed)
Patient endorsing anxiety with this Probation officer but did states he is feeling better overall since coming here   Problem: Self-Concept: Goal: Level of anxiety will decrease Outcome: Not Progressing

## 2020-09-18 NOTE — Progress Notes (Signed)
Patient calm and pleasant during assessment. Pt denies SI/HI/AVH. Patient endorses anxiety and depression stating it was from finding out today that there is an issue with the place he is living and it needs to be fixed up. Patient given education, support, and encouragement to be active in his treatment plan. Patient compliant with medication administration per MD orders. Pt observed interacting appropriately with staff and peers on the unit. Patient being monitored Q 15 minutes for safety per unit protocol. Patient remains safe on the unit.

## 2020-09-18 NOTE — Progress Notes (Addendum)
Patient alert and oriented x 4, affect is flat but he brightens upon no distress noted he appears less anxious he is elated that he goes home this week. He was interacting appropriately with peers and staff, he rated depression a 4/10 on a scale (0 low - 10 high) he was complaint with medication regimen, 16 minutes safety checks maintained will continue to monitor.

## 2020-09-18 NOTE — Progress Notes (Signed)
Recreation Therapy Notes    Date: 09/18/2020  Time: 9:30 am   Location: Craft room     Behavioral response: N/A   Intervention Topic: Strengths  Discussion/Intervention: Patient did not attend group.   Clinical Observations/Feedback:  Patient did not attend group.   Denene Alamillo LRT/CTRS         Ronnica Dreese 09/18/2020 10:50 AM

## 2020-09-19 NOTE — Plan of Care (Addendum)
Pt rates depression and anxiety 3/10 and hopelessness 2/10. Pt denies SI, HI and AVH. Pt was educated on care plan and verbalizes understanding. Pt was encouraged to attend groups. Collier Bullock RN Problem: Education: Goal: Knowledge of [facility] Education information/materials will improve Outcome: Progressing   Problem: Health Behavior/Discharge Planning: Goal: Identification of resources available to assist in meeting health care needs will improve Outcome: Progressing Goal: Compliance with treatment plan for underlying cause of condition will improve Outcome: Progressing   Problem: Physical Regulation: Goal: Ability to maintain clinical measurements within normal limits will improve Outcome: Progressing   Problem: Safety: Goal: Periods of time without injury will increase Outcome: Progressing   Problem: Education: Goal: Utilization of techniques to improve thought processes will improve Outcome: Progressing Goal: Knowledge of the prescribed therapeutic regimen will improve Outcome: Progressing   Problem: Coping: Goal: Coping ability will improve Outcome: Progressing Goal: Will verbalize feelings Outcome: Progressing   Problem: Role Relationship: Goal: Will demonstrate positive changes in social behaviors and relationships Outcome: Progressing   Problem: Self-Concept: Goal: Will verbalize positive feelings about self Outcome: Progressing Goal: Level of anxiety will decrease Outcome: Progressing

## 2020-09-19 NOTE — Plan of Care (Signed)
Patient endorses anxiety today with this Probation officer  Problem: Self-Concept: Goal: Level of anxiety will decrease Outcome: Not Progressing

## 2020-09-19 NOTE — Progress Notes (Signed)
Patient calm and pleasant during assessment. Pt denies SI/HI/AVH. Patient stated he is ready to go this week and thinks it has done him a lot of good being in here.Patient given education, support, and encouragement to be active in his treatment plan. Patient compliant with medication administration per MD orders. Pt observed interacting appropriately with staff and peers on the unit. Patient being monitored Q 15 minutes for safety per unit protocol. Patient remains safe on the unit.

## 2020-09-19 NOTE — Progress Notes (Signed)
[facility] MD Progress Note  09/19/2020 4:20 PM Ian [name]  MRN:  [id number] Subjective: Follow-up for this gentleman with chronic depression and anxiety.  He reports he is feeling significantly better today because he has confirmed that things should work out well if he is discharged on Friday.  He has made arrangements for a ride to pick him up at that time.  Patient denies any suicidal thoughts.  Not showing any behavior problems.  Physically feeling fine. Principal Problem: Severe recurrent major depression (HCC) Diagnosis: Principal Problem:   Severe recurrent major depression ([place]) Active Problems:   HTN (hypertension)   Alcohol use disorder, moderate, dependence (HCC)   Cocaine use disorder, moderate, dependence (HCC)   Generalized anxiety disorder   COPD exacerbation ([place])  Total Time spent with patient: 30 minutes  Past Psychiatric History: Longstanding history of chronic recurrent anxiety and depression  Past Medical History:  Past Medical History:  Diagnosis Date  . Anxiety   . Barrett esophagus   . Cancer ([place])   . Coronary artery disease   . Depression   . GERD (gastroesophageal reflux disease)   . Hypertension   . Pre-diabetes   . Stroke [facility]) 2014   "mini-stroke" per patient    Past Surgical History:  Procedure Laterality Date  . ANGIOPLASTY    . APPENDECTOMY    . CARDIAC CATHETERIZATION    . CHOLECYSTECTOMY    . WRIST SURGERY Right    age 59   Family History:  Family History  Problem Relation Age of Onset  . Stroke Father   . Leukemia Father   . Breast cancer Sister    Family Psychiatric  History: See previous Social History:  Social History   Substance and Sexual Activity  Alcohol Use Yes     Social History   Substance and Sexual Activity  Drug Use Yes  . Types: Cocaine, Marijuana    Social History   Socioeconomic History  . Marital status: Divorced    Spouse name: Not on file  . Number of children: Not on file  . Years of education:  Not on file  . Highest education level: Not on file  Occupational History  . Not on file  Tobacco Use  . Smoking status: Current Every Day Smoker    Packs/day: 1.00    Types: Cigarettes  . Smokeless tobacco: Never Used  Vaping Use  . Vaping Use: Never used  Substance and Sexual Activity  . Alcohol use: Yes  . Drug use: Yes    Types: Cocaine, Marijuana  . Sexual activity: Not on file  Other Topics Concern  . Not on file  Social History Narrative  . Not on file   Social Determinants of Health   Financial Resource Strain:   . Difficulty of Paying Living Expenses: Not on file  Food Insecurity:   . Worried About Charity fundraiser in the Last Year: Not on file  . Ran Out of Food in the Last Year: Not on file  Transportation Needs:   . Lack of Transportation (Medical): Not on file  . Lack of Transportation (Non-Medical): Not on file  Physical Activity:   . Days of Exercise per Week: Not on file  . Minutes of Exercise per Session: Not on file  Stress:   . Feeling of Stress : Not on file  Social Connections:   . Frequency of Communication with Friends and Family: Not on file  . Frequency of Social Gatherings with Friends and Family: Not  on file  . Attends Religious Services: Not on file  . Active Member of Clubs or Organizations: Not on file  . Attends Archivist Meetings: Not on file  . Marital Status: Not on file   Additional Social History:                         Sleep: Fair  Appetite:  Fair  Current Medications: Current Facility-Administered Medications  Medication Dose Route Frequency Provider Last Rate Last Admin  . alum & mag hydroxide-simeth (MAALOX/MYLANTA) 200-200-20 MG/5ML suspension 30 mL  30 mL Oral Q4H PRN Lindon Romp A, NP   30 mL at 09/08/20 1232  . aspirin chewable tablet 81 mg  81 mg Oral Daily Tera Pellicane, [name], MD   81 mg at 09/19/20 0806  . atorvastatin (LIPITOR) tablet 40 mg  40 mg Oral Daily Kunal Levario, [name], MD   40 mg at  09/19/20 0806  . busPIRone (BUSPAR) tablet 15 mg  15 mg Oral TID Aubree Doody, [name], MD   15 mg at 09/19/20 1600  . clonazePAM (KLONOPIN) tablet 0.5 mg  0.5 mg Oral BID PRN Ammarie Matsuura T, MD   0.5 mg at 09/19/20 1600  . escitalopram (LEXAPRO) tablet 20 mg  20 mg Oral Daily Courtnei Ruddell, [name], MD   20 mg at 09/19/20 0905  . hydrochlorothiazide (HYDRODIURIL) tablet 25 mg  25 mg Oral Daily Chrisanna Mishra, [name], MD   25 mg at 09/19/20 0807  . ibuprofen (ADVIL) tablet 600 mg  600 mg Oral Q6H PRN Aydyn Testerman T, MD   600 mg at 09/19/20 1600  . lisinopril (ZESTRIL) tablet 40 mg  40 mg Oral Daily Lindon Romp A, NP   40 mg at 09/19/20 0805  . magnesium hydroxide (MILK OF MAGNESIA) suspension 30 mL  30 mL Oral Daily PRN Lindon Romp A, NP      . menthol-cetylpyridinium (CEPACOL) lozenge 3 mg  1 lozenge Oral PRN Kyzen Horn, [name], MD   3 mg at 09/07/20 1515  . metFORMIN (GLUCOPHAGE-XR) 24 hr tablet 500 mg  500 mg Oral Q breakfast Zaahir Pickney, [name], MD   500 mg at 09/19/20 0805  . mometasone-formoterol (DULERA) 200-5 MCG/ACT inhaler 2 puff  2 puff Inhalation BID Lindon Romp A, NP   2 puff at 09/19/20 0905  . nicotine (NICODERM CQ - dosed in mg/24 hours) patch 21 mg  21 mg Transdermal Daily Janesa Dockery, [name], MD   21 mg at 09/19/20 0810  . pantoprazole (PROTONIX) EC tablet 40 mg  40 mg Oral Daily Iysha Mishkin, [name], MD   40 mg at 09/19/20 0806  . QUEtiapine (SEROQUEL XR) 24 hr tablet 400 mg  400 mg Oral QHS Maverick Patman T, MD   400 mg at 09/18/20 2137  . zolpidem (AMBIEN) tablet 10 mg  10 mg Oral QHS PRN Juanita Streight, [name], MD   10 mg at 09/18/20 2137    Lab Results: No results found for this or any previous visit (from the past 48 hour(s)).  Blood Alcohol level:  Lab Results  Component Value Date   ETH 137 (H) 09/05/2020   ETH <10 16/96/7893    Metabolic Disorder Labs: Lab Results  Component Value Date   HGBA1C 7.4 (H) 08/26/2019   MPG 165.68 08/26/2019   MPG 136.98 12/13/2018   No results found for:  PROLACTIN Lab Results  Component Value Date   CHOL 178 08/26/2019   TRIG 375 (H) 08/26/2019  HDL 23 (L) 08/26/2019   CHOLHDL 7.7 08/26/2019   VLDL 75 (H) 08/26/2019   LDLCALC 80 08/26/2019   LDLCALC 70 12/13/2018    Physical Findings: AIMS:  , ,  ,  ,    CIWA:  CIWA-Ar Total: 0 COWS:     Musculoskeletal: Strength & Muscle Tone: within normal limits Gait & Station: normal Patient leans: N/A  Psychiatric Specialty Exam: Physical Exam Vitals and nursing note reviewed.  Constitutional:      Appearance: He is well-developed.  HENT:     Head: Normocephalic and atraumatic.  Eyes:     Conjunctiva/sclera: Conjunctivae normal.     Pupils: Pupils are equal, round, and reactive to light.  Cardiovascular:     Heart sounds: Normal heart sounds.  Pulmonary:     Effort: Pulmonary effort is normal.  Abdominal:     Palpations: Abdomen is soft.  Musculoskeletal:        General: Normal range of motion.     Cervical back: Normal range of motion.  Skin:    General: Skin is warm and dry.  Neurological:     General: No focal deficit present.     Mental Status: He is alert.  Psychiatric:        Mood and Affect: Mood normal.        Thought Content: Thought content normal.     Review of Systems  Constitutional: Negative.   HENT: Negative.   Eyes: Negative.   Respiratory: Negative.   Cardiovascular: Negative.   Gastrointestinal: Negative.   Musculoskeletal: Negative.   Skin: Negative.   Neurological: Negative.   Psychiatric/Behavioral: Negative.     Blood pressure 112/84, pulse 92, temperature 98.4 F (36.9 C), temperature source Oral, resp. rate 17, SpO2 96 %.There is no height or weight on file to calculate BMI.  General Appearance: Casual  Eye Contact:  Good  Speech:  Clear and Coherent  Volume:  Normal  Mood:  Euthymic  Affect:  Congruent  Thought Process:  Goal Directed  Orientation:  Full (Time, Place, and Person)  Thought Content:  Logical  Suicidal Thoughts:  No   Homicidal Thoughts:  No  Memory:  Immediate;   Fair Recent;   Fair Remote;   Fair  Judgement:  Fair  Insight:  Fair  Psychomotor Activity:  Normal  Concentration:  Concentration: Fair  Recall:  [employer] of Knowledge:  Fair  Language:  Fair  Akathisia:  No  Handed:  Right  AIMS (if indicated):     Assets:  Desire for Improvement Resilience  ADL's:  Intact  Cognition:  WNL  Sleep:  Number of Hours: 7.15     Treatment Plan Summary: Plan No change to medication.  Supportive counseling and encouragement.  Reviewed his treatment plan.  Alethia Berthold, MD 09/19/2020, 4:21 PM

## 2020-09-20 MED ORDER — ZOLPIDEM TARTRATE 10 MG PO TABS
10.0000 mg | ORAL_TABLET | Freq: Every evening | ORAL | 1 refills | Status: DC | PRN
Start: 2020-09-20 — End: 2020-09-21

## 2020-09-20 MED ORDER — ESCITALOPRAM OXALATE 20 MG PO TABS
20.0000 mg | ORAL_TABLET | Freq: Every day | ORAL | 1 refills | Status: DC
Start: 2020-09-20 — End: 2020-09-21

## 2020-09-20 MED ORDER — LISINOPRIL 40 MG PO TABS
40.0000 mg | ORAL_TABLET | Freq: Every day | ORAL | 1 refills | Status: DC
Start: 2020-09-21 — End: 2020-09-21

## 2020-09-20 MED ORDER — HYDROCHLOROTHIAZIDE 25 MG PO TABS
25.0000 mg | ORAL_TABLET | Freq: Every day | ORAL | 1 refills | Status: DC
Start: 2020-09-20 — End: 2020-09-21

## 2020-09-20 MED ORDER — IBUPROFEN 600 MG PO TABS
600.0000 mg | ORAL_TABLET | Freq: Four times a day (QID) | ORAL | 1 refills | Status: DC | PRN
Start: 2020-09-20 — End: 2020-09-21

## 2020-09-20 MED ORDER — ATORVASTATIN CALCIUM 40 MG PO TABS
40.0000 mg | ORAL_TABLET | Freq: Every day | ORAL | 1 refills | Status: DC
Start: 2020-09-21 — End: 2020-09-21

## 2020-09-20 MED ORDER — QUETIAPINE FUMARATE ER 400 MG PO TB24
400.0000 mg | ORAL_TABLET | Freq: Every day | ORAL | 1 refills | Status: DC
Start: 2020-09-20 — End: 2020-09-21

## 2020-09-20 MED ORDER — BUSPIRONE HCL 15 MG PO TABS
15.0000 mg | ORAL_TABLET | Freq: Three times a day (TID) | ORAL | 1 refills | Status: DC
Start: 2020-09-20 — End: 2020-09-21

## 2020-09-20 MED ORDER — MOMETASONE FURO-FORMOTEROL FUM 200-5 MCG/ACT IN AERO
2.0000 | INHALATION_SPRAY | Freq: Two times a day (BID) | RESPIRATORY_TRACT | 1 refills | Status: DC
Start: 2020-09-20 — End: 2020-09-21

## 2020-09-20 MED ORDER — METFORMIN HCL ER 500 MG PO TB24
500.0000 mg | ORAL_TABLET | Freq: Every day | ORAL | 1 refills | Status: DC
Start: 2020-09-21 — End: 2020-09-21

## 2020-09-20 MED ORDER — ASPIRIN 81 MG PO CHEW
81.0000 mg | CHEWABLE_TABLET | Freq: Every day | ORAL | 1 refills | Status: DC
Start: 2020-09-20 — End: 2020-09-21

## 2020-09-20 MED ORDER — CLONAZEPAM 0.5 MG PO TABS
0.5000 mg | ORAL_TABLET | Freq: Two times a day (BID) | ORAL | 1 refills | Status: DC | PRN
Start: 2020-09-20 — End: 2020-09-21

## 2020-09-20 MED ORDER — PANTOPRAZOLE SODIUM 40 MG PO TBEC
40.0000 mg | DELAYED_RELEASE_TABLET | Freq: Every day | ORAL | 1 refills | Status: DC
Start: 2020-09-21 — End: 2020-09-21

## 2020-09-20 MED ORDER — ALBUTEROL SULFATE HFA 108 (90 BASE) MCG/ACT IN AERS: 2.0000 | INHALATION_SPRAY | Freq: Four times a day (QID) | RESPIRATORY_TRACT | 1 refills | Status: DC | PRN

## 2020-09-20 NOTE — Plan of Care (Signed)
  Problem: Education: Goal: Knowledge of [name] [facility] Education information/materials will improve Outcome: Progressing   Problem: Health Behavior/Discharge Planning: Goal: Identification of resources available to assist in meeting health care needs will improve Outcome: Progressing Goal: Compliance with treatment plan for underlying cause of condition will improve Outcome: Progressing   Problem: Physical Regulation: Goal: Ability to maintain clinical measurements within normal limits will improve Outcome: Progressing   Problem: Safety: Goal: Periods of time without injury will increase Outcome: Progressing   Problem: Education: Goal: Utilization of techniques to improve thought processes will improve Outcome: Progressing Goal: Knowledge of the prescribed therapeutic regimen will improve Outcome: Progressing   Problem: Coping: Goal: Coping ability will improve Outcome: Progressing Goal: Will verbalize feelings Outcome: Progressing   Problem: Role Relationship: Goal: Will demonstrate positive changes in social behaviors and relationships Outcome: Progressing   Problem: Self-Concept: Goal: Will verbalize positive feelings about self Outcome: Progressing Goal: Level of anxiety will decrease Outcome: Progressing

## 2020-09-20 NOTE — Progress Notes (Signed)
Recreation Therapy Notes  Date: 09/20/2020  Time: 9:30 am   Location: Room 21   Behavioral response: N/A   Intervention Topic: Animal Assisted therapy    Discussion/Intervention: Patient did not attend group.   Clinical Observations/Feedback:  Patient did not attend group.   Radwan Cowley LRT/CTRS        Latron Ribas 09/20/2020 12:23 PM

## 2020-09-20 NOTE — Progress Notes (Signed)
[facility] MD Progress Note  09/20/2020 12:22 PM Ian [name]  MRN:  [id number] Subjective: Follow-up for this 59 year old gentleman with depression and anxiety.  Today he is a little bit more dysphoric mainly because his back is hurting him worse.  Otherwise however mood remains stable.  No report of suicidal thoughts.  Optimistic about the future.  Generally taking good care of himself and interacts well on the unit. Principal Problem: Severe recurrent major depression (HCC) Diagnosis: Principal Problem:   Severe recurrent major depression ([place]) Active Problems:   HTN (hypertension)   Alcohol use disorder, moderate, dependence (HCC)   Cocaine use disorder, moderate, dependence (HCC)   Generalized anxiety disorder   COPD exacerbation ([place])  Total Time spent with patient: 30 minutes  Past Psychiatric History: Past history of longstanding recurrent depression and anxiety  Past Medical History:  Past Medical History:  Diagnosis Date  . Anxiety   . Barrett esophagus   . Cancer ([place])   . Coronary artery disease   . Depression   . GERD (gastroesophageal reflux disease)   . Hypertension   . Pre-diabetes   . Stroke [facility]) 2014   "mini-stroke" per patient    Past Surgical History:  Procedure Laterality Date  . ANGIOPLASTY    . APPENDECTOMY    . CARDIAC CATHETERIZATION    . CHOLECYSTECTOMY    . WRIST SURGERY Right    age 52   Family History:  Family History  Problem Relation Age of Onset  . Stroke Father   . Leukemia Father   . Breast cancer Sister    Family Psychiatric  History: See previous Social History:  Social History   Substance and Sexual Activity  Alcohol Use Yes     Social History   Substance and Sexual Activity  Drug Use Yes  . Types: Cocaine, Marijuana    Social History   Socioeconomic History  . Marital status: Divorced    Spouse name: Not on file  . Number of children: Not on file  . Years of education: Not on file  . Highest education level: Not  on file  Occupational History  . Not on file  Tobacco Use  . Smoking status: Current Every Day Smoker    Packs/day: 1.00    Types: Cigarettes  . Smokeless tobacco: Never Used  Vaping Use  . Vaping Use: Never used  Substance and Sexual Activity  . Alcohol use: Yes  . Drug use: Yes    Types: Cocaine, Marijuana  . Sexual activity: Not on file  Other Topics Concern  . Not on file  Social History Narrative  . Not on file   Social Determinants of Health   Financial Resource Strain:   . Difficulty of Paying Living Expenses: Not on file  Food Insecurity:   . Worried About Charity fundraiser in the Last Year: Not on file  . Ran Out of Food in the Last Year: Not on file  Transportation Needs:   . Lack of Transportation (Medical): Not on file  . Lack of Transportation (Non-Medical): Not on file  Physical Activity:   . Days of Exercise per Week: Not on file  . Minutes of Exercise per Session: Not on file  Stress:   . Feeling of Stress : Not on file  Social Connections:   . Frequency of Communication with Friends and Family: Not on file  . Frequency of Social Gatherings with Friends and Family: Not on file  . Attends Religious Services: Not on  file  . Active Member of Clubs or Organizations: Not on file  . Attends Archivist Meetings: Not on file  . Marital Status: Not on file   Additional Social History:                         Sleep: Fair  Appetite:  Fair  Current Medications: Current Facility-Administered Medications  Medication Dose Route Frequency Provider Last Rate Last Admin  . alum & mag hydroxide-simeth (MAALOX/MYLANTA) 200-200-20 MG/5ML suspension 30 mL  30 mL Oral Q4H PRN Lindon Romp A, NP   30 mL at 09/08/20 1232  . aspirin chewable tablet 81 mg  81 mg Oral Daily Shylah Dossantos, [name], MD   81 mg at 09/20/20 0803  . atorvastatin (LIPITOR) tablet 40 mg  40 mg Oral Daily Quentin Shorey, [name], MD   40 mg at 09/20/20 0803  . busPIRone (BUSPAR) tablet 15  mg  15 mg Oral TID Hilbert Briggs, [name], MD   15 mg at 09/20/20 0804  . clonazePAM (KLONOPIN) tablet 0.5 mg  0.5 mg Oral BID PRN Alesia Oshields T, MD   0.5 mg at 09/19/20 1600  . escitalopram (LEXAPRO) tablet 20 mg  20 mg Oral Daily Rihan Schueler, [name], MD   20 mg at 09/20/20 0803  . hydrochlorothiazide (HYDRODIURIL) tablet 25 mg  25 mg Oral Daily Trenyce Loera, [name], MD   25 mg at 09/20/20 0804  . ibuprofen (ADVIL) tablet 600 mg  600 mg Oral Q6H PRN Shizuko Wojdyla, [name], MD   600 mg at 09/20/20 0804  . lisinopril (ZESTRIL) tablet 40 mg  40 mg Oral Daily Lindon Romp A, NP   40 mg at 09/20/20 0804  . magnesium hydroxide (MILK OF MAGNESIA) suspension 30 mL  30 mL Oral Daily PRN Lindon Romp A, NP      . menthol-cetylpyridinium (CEPACOL) lozenge 3 mg  1 lozenge Oral PRN Deklyn Gibbon, [name], MD   3 mg at 09/07/20 1515  . metFORMIN (GLUCOPHAGE-XR) 24 hr tablet 500 mg  500 mg Oral Q breakfast Jalina Blowers T, MD   500 mg at 09/20/20 1125  . mometasone-formoterol (DULERA) 200-5 MCG/ACT inhaler 2 puff  2 puff Inhalation BID Lindon Romp A, NP   2 puff at 09/20/20 1125  . nicotine (NICODERM CQ - dosed in mg/24 hours) patch 21 mg  21 mg Transdermal Daily Adley Castello, [name], MD   21 mg at 09/20/20 0806  . pantoprazole (PROTONIX) EC tablet 40 mg  40 mg Oral Daily Cree Kunert, [name], MD   40 mg at 09/20/20 0805  . QUEtiapine (SEROQUEL XR) 24 hr tablet 400 mg  400 mg Oral QHS Lashawn Orrego T, MD   400 mg at 09/19/20 2136  . zolpidem (AMBIEN) tablet 10 mg  10 mg Oral QHS PRN Normal Recinos, [name], MD   10 mg at 09/19/20 2136    Lab Results: No results found for this or any previous visit (from the past 48 hour(s)).  Blood Alcohol level:  Lab Results  Component Value Date   ETH 137 (H) 09/05/2020   ETH <10 84/13/2440    Metabolic Disorder Labs: Lab Results  Component Value Date   HGBA1C 7.4 (H) 08/26/2019   MPG 165.68 08/26/2019   MPG 136.98 12/13/2018   No results found for: PROLACTIN Lab Results  Component Value Date   CHOL 178  08/26/2019   TRIG 375 (H) 08/26/2019   HDL 23 (L) 08/26/2019   CHOLHDL  7.7 08/26/2019   VLDL 75 (H) 08/26/2019   LDLCALC 80 08/26/2019   LDLCALC 70 12/13/2018    Physical Findings: AIMS:  , ,  ,  ,    CIWA:  CIWA-Ar Total: 0 COWS:     Musculoskeletal: Strength & Muscle Tone: within normal limits Gait & Station: normal Patient leans: N/A  Psychiatric Specialty Exam: Physical Exam Vitals and nursing note reviewed.  Constitutional:      Appearance: He is well-developed.  HENT:     Head: Normocephalic and atraumatic.  Eyes:     Conjunctiva/sclera: Conjunctivae normal.     Pupils: Pupils are equal, round, and reactive to light.  Cardiovascular:     Heart sounds: Normal heart sounds.  Pulmonary:     Effort: Pulmonary effort is normal.  Abdominal:     Palpations: Abdomen is soft.  Musculoskeletal:        General: Normal range of motion.     Cervical back: Normal range of motion.  Skin:    General: Skin is warm and dry.  Neurological:     General: No focal deficit present.     Mental Status: He is alert.  Psychiatric:        Mood and Affect: Mood normal.        Behavior: Behavior normal.     Review of Systems  Constitutional: Negative.   HENT: Negative.   Eyes: Negative.   Respiratory: Negative.   Cardiovascular: Negative.   Gastrointestinal: Negative.   Musculoskeletal: Positive for back pain.  Skin: Negative.   Neurological: Negative.   Psychiatric/Behavioral: Negative.     Blood pressure 137/86, pulse 88, temperature 98.5 F (36.9 C), temperature source Oral, resp. rate 17, SpO2 97 %.There is no height or weight on file to calculate BMI.  General Appearance: Fairly Groomed  Eye Contact:  Fair  Speech:  Clear and Coherent  Volume:  Normal  Mood:  Euthymic  Affect:  Congruent  Thought Process:  Goal Directed  Orientation:  Full (Time, Place, and Person)  Thought Content:  Logical  Suicidal Thoughts:  No  Homicidal Thoughts:  No  Memory:  Immediate;    Fair Recent;   Fair Remote;   Fair  Judgement:  Fair  Insight:  Fair  Psychomotor Activity:  Normal  Concentration:  Concentration: Fair  Recall:  [employer] of Knowledge:  Fair  Language:  Fair  Akathisia:  No  Handed:  Right  AIMS (if indicated):     Assets:  Desire for Improvement Housing Resilience  ADL's:  Intact  Cognition:  WNL  Sleep:  Number of Hours: 7.75     Treatment Plan Summary: Daily contact with patient to assess and evaluate symptoms and progress in treatment, Medication management and Plan Patient is stabilizing.  Feeling better.  Getting more confident about caring for himself outside the hospital.  Plan at this point is for discharge tomorrow morning.  His peers support is planning to be able to pick him up.  We will go ahead and try to make some arrangements to prepare for discharge early.  Alethia Berthold, MD 09/20/2020, 12:22 PM

## 2020-09-20 NOTE — Tx Team (Signed)
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 alum & mag hydroxide-simeth (MAALOX/MYLANTA) 200-200-20 MG/5ML suspension 30 mL  30 mL Oral Q4H PRN Lindon Romp A, NP   30 mL at 09/08/20 1232  . aspirin chewable tablet 81 mg  81 mg Oral Daily [name], [name], MD   81 mg at 09/20/20 0803  . atorvastatin (LIPITOR) tablet 40 mg  40 mg Oral Daily [name], [name], MD   40 mg at 09/20/20 0803  . busPIRone (BUSPAR) tablet 15 mg  15 mg Oral TID [name], [name], MD   15 mg at 09/20/20 1229  . clonazePAM (KLONOPIN) tablet 0.5 mg  0.5 mg Oral BID PRN [name], [name], MD   0.5 mg at 09/20/20 1229  . escitalopram (LEXAPRO) tablet 20 mg  20 mg Oral Daily [name], [name], MD   20 mg at 09/20/20 0803  . hydrochlorothiazide (HYDRODIURIL) tablet 25 mg  25 mg Oral Daily [name], [name], MD   25 mg at 09/20/20 0804  . ibuprofen (ADVIL) tablet 600 mg  600 mg Oral Q6H PRN [name], [name], MD   600 mg at 09/20/20 0804  . lisinopril (ZESTRIL) tablet 40 mg  40 mg Oral Daily Lindon Romp A, NP   40 mg at 09/20/20 0804  . magnesium hydroxide (MILK OF MAGNESIA) suspension 30 mL  30 mL Oral Daily PRN Lindon Romp A, NP      . menthol-cetylpyridinium (CEPACOL) lozenge 3 mg  1 lozenge Oral PRN [name], [name], MD   3 mg at 09/07/20 1515  . metFORMIN (GLUCOPHAGE-XR) 24 hr tablet 500 mg  500 mg Oral Q breakfast [name], [name], MD   500 mg at 09/20/20 1125  .  mometasone-formoterol (DULERA) 200-5 MCG/ACT inhaler 2 puff  2 puff Inhalation BID Lindon Romp A, NP   2 puff at 09/20/20 1125  . nicotine (NICODERM CQ - dosed in mg/24 hours) patch 21 mg  21 mg Transdermal Daily [name], [name], MD   21 mg at 09/20/20 0806  . pantoprazole (PROTONIX) EC tablet 40 mg  40 mg Oral Daily [name], [name], MD   40 mg at 09/20/20 0805  . QUEtiapine (SEROQUEL XR) 24 hr tablet 400 mg  400 mg Oral QHS [name], [name], MD   400 mg at 09/19/20 2136  . zolpidem (AMBIEN) tablet 10 mg  10 mg Oral QHS PRN [name], [name], MD   10 mg at 09/19/20 2136   PTA Medications: Medications Prior to Admission  Medication Sig Dispense Refill Last Dose  . ADVAIR DISKUS 500-50 MCG/DOSE AEPB Inhale 1 puff into the lungs every 12 (twelve) hours.     Marland Kitchen atorvastatin (LIPITOR) 40 MG tablet Take 1 tablet (40 mg total) by mouth daily at 6 PM. 7 tablet 0   . busPIRone (BUSPAR) 10 MG tablet Take 1 tablet (10 mg total) by mouth 3 (three) times daily. (Patient taking differently: Take 15 mg by mouth 3 (three) times daily. ) 90 tablet 1   .  clonazePAM (KLONOPIN) 0.5 MG tablet Take 0.5 mg by mouth daily as needed for anxiety. (Patient not taking: Reported on 09/06/2020)     . cloNIDine (CATAPRES) 0.1 MG tablet Take 1 tablet (0.1 mg total) by mouth 2 (two) times daily as needed (BP Systolic > 425 or Disastolic > 956). 60 tablet 1   . lisinopril (PRINIVIL,ZESTRIL) 20 MG tablet Take 1 tablet (20 mg total) by mouth daily. (Patient taking differently: Take 40 mg by mouth daily. ) 7 tablet 0   . metFORMIN (GLUCOPHAGE-XR) 500 MG 24 hr tablet Take 500 mg by mouth daily.     . mometasone-formoterol (DULERA) 200-5 MCG/ACT AERO Inhale 2 puffs into the lungs 2 (two) times daily. (Patient not taking: Reported on 10/05/2019) 1 Inhaler 1   . omeprazole (PRILOSEC) 20 MG capsule Take 20 mg by mouth daily.     . QUEtiapine (SEROQUEL XR) 300 MG 24 hr tablet Take 1 tablet (300 mg total) by mouth at bedtime. (Patient not  taking: Reported on 09/06/2020) 30 tablet 1   . QUEtiapine (SEROQUEL XR) 400 MG 24 hr tablet Take 400 mg by mouth at bedtime.     . Sofosbuvir-Velpatasvir (EPCLUSA) 400-100 MG TABS Take 1 tablet by mouth daily at 6 PM. (Patient not taking: Reported on 08/25/2019) 30 tablet 0   . thiamine 100 MG tablet Take 1 tablet (100 mg total) by mouth daily. (Patient not taking: Reported on 10/05/2019) 30 tablet 1   . zolpidem (AMBIEN) 10 MG tablet Take 1 tablet (10 mg total) by mouth at bedtime. 30 tablet 1   . [DISCONTINUED] albuterol (PROVENTIL HFA;VENTOLIN HFA) 108 (90 Base) MCG/ACT inhaler Inhale 2 puffs into the lungs every 6 (six) hours as needed for wheezing or shortness of breath. 1 Inhaler 0   . [DISCONTINUED] aspirin 81 MG chewable tablet Chew 1 tablet (81 mg total) by mouth daily. 30 tablet 1   . [DISCONTINUED] escitalopram (LEXAPRO) 20 MG tablet Take 20 mg by mouth daily.     . [DISCONTINUED] hydrochlorothiazide (HYDRODIURIL) 25 MG tablet Take 25 mg by mouth daily.     . [DISCONTINUED] ibuprofen (ADVIL,MOTRIN) 600 MG tablet Take 1 tablet (600 mg total) by mouth every 6 (six) hours as needed for moderate pain. 60 tablet 1     Patient Stressors: Financial difficulties Marital or family conflict Medication change or noncompliance Substance abuse  Patient Strengths: Ability for insight Average or above average intelligence Communication skills Physical Health  Treatment Modalities: Medication Management, Group therapy, Case management,  1 to 1 session with clinician, Psychoeducation, Recreational therapy.   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  Therapeutic Interventions: 1 to 1 sessions, Unit Group sessions and Medication administration.  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[place]) Active Problems:   HTN (hypertension)   Alcohol use disorder, moderate, dependence (HCC)   Cocaine use disorder, moderate, dependence (HCC)   Generalized anxiety disorder   COPD exacerbation ([place])  Long  Term Goal(s): Improvement in symptoms so as ready for discharge Improvement in symptoms so as ready for discharge   Short Term Goals: Ability to disclose and discuss suicidal ideas Ability to demonstrate self-control will improve Ability to maintain clinical measurements within normal limits will improve Compliance with prescribed medications will improve Ability to identify triggers associated with substance abuse/mental health issues will improve     Medication Management: Evaluate patient's response, side effects, and tolerance of medication regimen.  Therapeutic Interventions: 1 to 1 sessions, Unit Group sessions and Medication administration.  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[place]) Long Term Goal(s): Knowledge of disease and therapeutic regimen to maintain health will improve  Short Term Goals: Ability to demonstrate self-control, Ability to participate in decision making will improve, Ability to verbalize feelings will improve, Ability to disclose and discuss suicidal ideas, Ability to identify and develop effective coping behaviors will improve and Compliance with prescribed medications will  improve  Medication Management: RN will administer medications as ordered by provider, will assess and evaluate patient's response and provide education to patient for prescribed medication. RN will report any adverse and/or side effects to prescribing provider.  Therapeutic Interventions: 1 on 1 counseling sessions, Psychoeducation, Medication administration, Evaluate responses to treatment, Monitor vital signs and CBGs as ordered, Perform/monitor CIWA, COWS, AIMS and Fall Risk screenings as ordered, Perform wound care treatments as ordered.  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  Short Term Goals: Engage patient in aftercare planning with referrals and resources, Increase social support, Identify triggers associated with mental health/substance abuse issues and Increase skills for wellness and recovery  Therapeutic Interventions: Assess for all discharge needs, 1 to 1 time with Social worker, Explore available resources and support systems, Assess for adequacy in community support network, Educate family and significant other(s) on suicide prevention, Complete Psychosocial Assessment, Interpersonal group therapy.  Evaluation of Outcomes: Progressing   Progress in Treatment: Attending groups: Yes. Participating in groups: Yes. Taking medication as prescribed: Yes. Toleration medication: Yes. Family/Significant other contact made: Yes, individual(s) contacted:  SPE completed  Patient understands diagnosis: Yes. Discussing patient identified problems/goals with staff: Yes. Medical problems stabilized or resolved: Yes. Denies suicidal/homicidal ideation: Yes. Issues/concerns per patient self-inventory: No. Other: N/a   New problem(s) identified: No,  Describe:  None   New Short Term/Long Term Goal(s): Elimination of symptoms of psychosis, medication management for mood stabilization; development of comprehensive mental wellness plan. Update 09/20/2020:  No changes at this time.    Patient Goals:  "get back on my meds and get some type of quality of living when I get out, it's been a lot building up that I don't know what to do about it" Update 09/15/20: No changes at this time  Update 09/20/2020:  No changes at this time.    Discharge Plan or Barriers: CSW will assist patient in developing discharge plans. Pt has asked for assistance with light bill and lot rent.  CSW has asked patient for copies of invoices so that they can be provided to charitable foundation for further assesing.  At this time the patient has not been able to do so. Update 09/15/20: No changes at this time.  Update 09/20/2020:  Patient expected to discharge on 09/21/2020.  Patient indicates that he is  not concerned with covering his upcoming expenses.    Reason for Continuation of Hospitalization: Anxiety Depression Medication stabilization Suicidal ideation  Estimated Length of Stay: 1-7 days   Attendees: Patient:  09/20/2020 12:48 PM  Physician: Dr. Weber Cooks, MD 09/20/2020 12:48 PM  Nursing:  09/20/2020 12:48 PM  RN Care Manager: 09/20/2020 12:48 PM  Social Worker: Assunta Curtis, LCSW 09/20/2020 12:48 PM  Recreational Therapist:  09/20/2020 12:48 PM  Other:  09/20/2020 12:48 PM  Other:  09/20/2020 12:48 PM  Other: 09/20/2020 12:48 PM    Scribe for Treatment Team: Rozann Lesches, LCSW 09/20/2020 12:48 PM

## 2020-09-20 NOTE — [facility] Suicide Risk Assessment (Signed)
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   HENT: Negative.   Eyes: Negative.   Respiratory: Negative.   Cardiovascular: Negative.   Gastrointestinal: Negative.   Musculoskeletal: Negative.   Skin: Negative.   Neurological: Negative.   Psychiatric/Behavioral: Negative.     Blood pressure 137/86, pulse 88, temperature 98.5 F (36.9 C), temperature source Oral, resp. rate 17, SpO2 97 %.There is no height or weight on file to calculate BMI.  General Appearance: Casual  Eye Contact::  Good  Speech:  Clear and [id number]  Volume:  Normal  Mood:  Euthymic  Affect:  Congruent  Thought Process:  Goal Directed  Orientation:  Full (Time, Place, and Person)  Thought Content:  Logical  Suicidal Thoughts:  No  Homicidal Thoughts:  No  Memory:  Immediate;   Fair Recent;   Fair Remote;   Fair  Judgement:  Fair  Insight:  Fair  Psychomotor Activity:  Normal  Concentration:  Fair  Recall:  [employer] [place]  Language: Fair  Akathisia:  No  Handed:  Right  AIMS (if indicated):     Assets:  Desire for Improvement Housing Physical Health Social Support  Sleep:  Number of Hours: 7.75  Cognition: WNL  ADL's:  Intact   Mental Status Per Nursing Assessment::   On Admission:  NA  Demographic Factors:  Male, Caucasian, Low socioeconomic status and Living alone  Loss Factors: Financial problems/change in  socioeconomic status  Historical Factors: Prior suicide attempts  Risk Reduction Factors:   Positive social support and Positive therapeutic relationship  Continued Clinical Symptoms:  Depression:   Impulsivity  Cognitive Features That Contribute To Risk:  None    Suicide Risk:  Minimal: No identifiable suicidal ideation.  Patients presenting with no risk factors but with morbid ruminations; may be classified as minimal risk based on the severity of the depressive symptoms    Plan Of Care/Follow-up recommendations:  Activity:  Activity as tolerated Diet:  Heart healthy diet as possible Other:  Patient will follow-up with [facility] as he has done previously and continue current medicine at discharge  Alethia Berthold, MD 09/20/2020, 12:28 PM

## 2020-09-20 NOTE — [facility] Group Notes (Signed)
Adult Psychoeducational Group Note  Date:  09/20/2020 Time:  6:53 AM  Group Topic/Focus:  Wrap-Up Group:   The focus of this group is to help patients review their daily goal of treatment and discuss progress on daily workbooks.  Participation Level:  Active  Participation Quality:  Appropriate  Affect:  Appropriate  Cognitive:  Appropriate  Insight: Good  Engagement in Group:  Engaged  Modes of Intervention:  Discussion  Additional Comments:  Discussion was about preventative action and self hygiene  Ian [name] Ian [name] 09/20/2020, 6:53 AM

## 2020-09-20 NOTE — Progress Notes (Signed)
D- Patient alert and oriented. Affect/mood is calm and cooperative. Pt rates depression 5/10 and anxiety 8/10. Pt denies SI, HI, AVH. Pt has had a PRN for pain.   A- Scheduled medications administered to patient, per MD orders. Support and encouragement provided.  Routine safety checks conducted every 15 minutes.  Patient informed to notify staff with problems or concerns.  R- No adverse drug reactions noted. Patient contracts for safety at this time. Patient compliant with medications and treatment plan. Patient receptive, calm, and cooperative. Patient interacts well with others on the unit.  Patient remains safe at this time.  Collier Bullock RN

## 2020-09-21 MED ORDER — HYDROCHLOROTHIAZIDE 25 MG PO TABS
25.0000 mg | ORAL_TABLET | Freq: Every day | ORAL | 1 refills | Status: DC
Start: 2020-09-21 — End: 2021-01-15

## 2020-09-21 MED ORDER — IBUPROFEN 600 MG PO TABS
600.0000 mg | ORAL_TABLET | Freq: Four times a day (QID) | ORAL | 1 refills | Status: DC | PRN
Start: 2020-09-21 — End: 2020-09-21

## 2020-09-21 MED ORDER — METFORMIN HCL ER 500 MG PO TB24
500.0000 mg | ORAL_TABLET | Freq: Every day | ORAL | 1 refills | Status: DC
Start: 2020-09-21 — End: 2020-09-21

## 2020-09-21 MED ORDER — ESCITALOPRAM OXALATE 20 MG PO TABS
20.0000 mg | ORAL_TABLET | Freq: Every day | ORAL | 1 refills | Status: DC
Start: 2020-09-21 — End: 2021-01-15

## 2020-09-21 MED ORDER — LISINOPRIL 40 MG PO TABS
40.0000 mg | ORAL_TABLET | Freq: Every day | ORAL | 1 refills | Status: DC
Start: 2020-09-21 — End: 2020-09-21

## 2020-09-21 MED ORDER — CLONAZEPAM 0.5 MG PO TABS
0.5000 mg | ORAL_TABLET | Freq: Two times a day (BID) | ORAL | 1 refills | Status: DC | PRN
Start: 2020-09-21 — End: 2021-01-15

## 2020-09-21 MED ORDER — ESCITALOPRAM OXALATE 20 MG PO TABS
20.0000 mg | ORAL_TABLET | Freq: Every day | ORAL | 1 refills | Status: DC
Start: 2020-09-21 — End: 2020-09-21

## 2020-09-21 MED ORDER — PANTOPRAZOLE SODIUM 40 MG PO TBEC
40.0000 mg | DELAYED_RELEASE_TABLET | Freq: Every day | ORAL | 1 refills | Status: DC
Start: 2020-09-21 — End: 2020-09-21

## 2020-09-21 MED ORDER — CLONAZEPAM 0.5 MG PO TABS
0.5000 mg | ORAL_TABLET | Freq: Two times a day (BID) | ORAL | 1 refills | Status: DC | PRN
Start: 2020-09-21 — End: 2020-09-21

## 2020-09-21 MED ORDER — ZOLPIDEM TARTRATE 10 MG PO TABS
10.0000 mg | ORAL_TABLET | Freq: Every evening | ORAL | 1 refills | Status: DC | PRN
Start: 2020-09-21 — End: 2021-01-15

## 2020-09-21 MED ORDER — ATORVASTATIN CALCIUM 40 MG PO TABS
40.0000 mg | ORAL_TABLET | Freq: Every day | ORAL | 1 refills | Status: DC
Start: 2020-09-21 — End: 2021-01-15

## 2020-09-21 MED ORDER — ASPIRIN 81 MG PO CHEW
81.0000 mg | CHEWABLE_TABLET | Freq: Every day | ORAL | 1 refills | Status: AC
Start: 2020-09-21 — End: ?

## 2020-09-21 MED ORDER — BUSPIRONE HCL 15 MG PO TABS
15.0000 mg | ORAL_TABLET | Freq: Three times a day (TID) | ORAL | 1 refills | Status: DC
Start: 2020-09-21 — End: 2020-09-21

## 2020-09-21 MED ORDER — MOMETASONE FURO-FORMOTEROL FUM 200-5 MCG/ACT IN AERO
2.0000 | INHALATION_SPRAY | Freq: Two times a day (BID) | RESPIRATORY_TRACT | 1 refills | Status: DC
Start: 2020-09-21 — End: 2020-09-21

## 2020-09-21 MED ORDER — MOMETASONE FURO-FORMOTEROL FUM 200-5 MCG/ACT IN AERO
2.0000 | INHALATION_SPRAY | Freq: Two times a day (BID) | RESPIRATORY_TRACT | 1 refills | Status: AC
Start: 2020-09-21 — End: ?

## 2020-09-21 MED ORDER — QUETIAPINE FUMARATE ER 400 MG PO TB24
400.0000 mg | ORAL_TABLET | Freq: Every day | ORAL | 1 refills | Status: DC
Start: 2020-09-21 — End: 2021-01-15

## 2020-09-21 MED ORDER — QUETIAPINE FUMARATE ER 400 MG PO TB24
400.0000 mg | ORAL_TABLET | Freq: Every day | ORAL | 1 refills | Status: DC
Start: 2020-09-21 — End: 2020-09-21

## 2020-09-21 MED ORDER — METFORMIN HCL ER 500 MG PO TB24
500.0000 mg | ORAL_TABLET | Freq: Every day | ORAL | 1 refills | Status: DC
Start: 2020-09-21 — End: 2021-01-15

## 2020-09-21 MED ORDER — HYDROCHLOROTHIAZIDE 25 MG PO TABS
25.0000 mg | ORAL_TABLET | Freq: Every day | ORAL | 1 refills | Status: DC
Start: 2020-09-21 — End: 2020-09-21

## 2020-09-21 MED ORDER — LISINOPRIL 40 MG PO TABS
40.0000 mg | ORAL_TABLET | Freq: Every day | ORAL | 1 refills | Status: DC
Start: 2020-09-21 — End: 2021-01-15

## 2020-09-21 MED ORDER — ATORVASTATIN CALCIUM 40 MG PO TABS
40.0000 mg | ORAL_TABLET | Freq: Every day | ORAL | 1 refills | Status: DC
Start: 2020-09-21 — End: 2020-09-21

## 2020-09-21 MED ORDER — ZOLPIDEM TARTRATE 10 MG PO TABS
10.0000 mg | ORAL_TABLET | Freq: Every evening | ORAL | 1 refills | Status: DC | PRN
Start: 2020-09-21 — End: 2020-09-21

## 2020-09-21 MED ORDER — PANTOPRAZOLE SODIUM 40 MG PO TBEC
40.0000 mg | DELAYED_RELEASE_TABLET | Freq: Every day | ORAL | 1 refills | Status: DC
Start: 2020-09-21 — End: 2021-01-15

## 2020-09-21 MED ORDER — IBUPROFEN 600 MG PO TABS
600.0000 mg | ORAL_TABLET | Freq: Four times a day (QID) | ORAL | 1 refills | Status: DC | PRN
Start: 2020-09-21 — End: 2021-01-04

## 2020-09-21 MED ORDER — BUSPIRONE HCL 15 MG PO TABS
15.0000 mg | ORAL_TABLET | Freq: Three times a day (TID) | ORAL | 1 refills | Status: DC
Start: 2020-09-21 — End: 2021-01-15

## 2020-09-21 MED ORDER — ASPIRIN 81 MG PO CHEW
81.0000 mg | CHEWABLE_TABLET | Freq: Every day | ORAL | 1 refills | Status: DC
Start: 2020-09-21 — End: 2020-09-21

## 2020-09-21 NOTE — Progress Notes (Signed)
D: Pt alert and oriented. Pt rates depression 3/10, hopelessness 3/10, and anxiety 5/10. Pt reports energy level as normal and concentration as being good. Pt reports sleep last night as being fair. Pt did receive medications for sleep and did find them helpful. Pt reports experiencing 8/10 chronic back pain, prn meds given. Pt denies experiencing any SI/HI, or AVH at this time.   A: Scheduled medications administered to pt, per MD orders. Support and encouragement provided. Frequent verbal contact made. Routine safety checks conducted q15 minutes.   R: No adverse drug reactions noted. Pt verbally contracts for safety at this time. Pt complaint with medications. Pt interacts well with others on the unit. Pt remains safe at this time. Will continue to monitor.

## 2020-09-21 NOTE — Plan of Care (Signed)
  Problem: Coping: Goal: Will verbalize feelings Outcome: Progressing Note: Patient verbalized feeling less anxious and looking forward to going home, he denies SI/HI/AVH

## 2020-09-21 NOTE — Discharge Summary (Signed)
Physician Discharge Summary Note  Patient:  Ian [name] is an 59 y.o., male MRN:  [id number] DOB:  1961-06-28 Patient phone:  [phone] (home)  Patient address:   [address] [place] [address] [place] [address],  Total Time spent with patient: 30 minutes  Date of Admission:  09/06/2020 Date of Discharge: 09/21/2020  Reason for Admission: Patient with a long history of depression and anxiety presented to the hospital reporting worsening anxiety symptoms with panic features, depressed mood, hopelessness, suicidal thoughts without specific plan, poor self-care.  Multiple major life stresses most prominently his living situation being somewhat unsafe because of his own neglect and potentially going to be evicted from it.  Principal Problem: Severe recurrent major depression ([place]) Discharge Diagnoses: Principal Problem:   Severe recurrent major depression ([place]) Active Problems:   HTN (hypertension)   Alcohol use disorder, moderate, dependence (HCC)   Cocaine use disorder, moderate, dependence (HCC)   Generalized anxiety disorder   COPD exacerbation (HCC)   Past Psychiatric History: Patient has a long history of major depression and anxiety.  Complicated in the past by chronic alcohol use and cocaine use.  Recently staying more sober than average but still suffering chronic depression.  Positive past history of suicide attempts  Past Medical History:  Past Medical History:  Diagnosis Date  . Anxiety   . Barrett esophagus   . Cancer ([place])   . Coronary artery disease   . Depression   . GERD (gastroesophageal reflux disease)   . Hypertension   . Pre-diabetes   . Stroke [facility]) 2014   "mini-stroke" per patient    Past Surgical History:  Procedure Laterality Date  . ANGIOPLASTY    . APPENDECTOMY    . CARDIAC CATHETERIZATION    . CHOLECYSTECTOMY    . WRIST SURGERY Right    age 46   Family History:  Family History  Problem Relation Age of Onset  . Stroke Father   . Leukemia  Father   . Breast cancer Sister    Family Psychiatric  History: See previous Social History:  Social History   Substance and Sexual Activity  Alcohol Use Yes     Social History   Substance and Sexual Activity  Drug Use Yes  . Types: Cocaine, Marijuana    Social History   Socioeconomic History  . Marital status: Divorced    Spouse name: Not on file  . Number of children: Not on file  . Years of education: Not on file  . Highest education level: Not on file  Occupational History  . Not on file  Tobacco Use  . Smoking status: Current Every Day Smoker    Packs/day: 1.00    Types: Cigarettes  . Smokeless tobacco: Never Used  Vaping Use  . Vaping Use: Never used  Substance and Sexual Activity  . Alcohol use: Yes  . Drug use: Yes    Types: Cocaine, Marijuana  . Sexual activity: Not on file  Other Topics Concern  . Not on file  Social History Narrative  . Not on file   Social Determinants of Health   Financial Resource Strain:   . Difficulty of Paying Living Expenses: Not on file  Food Insecurity:   . Worried About Charity fundraiser in the Last Year: Not on file  . Ran Out of Food in the Last Year: Not on file  Transportation Needs:   . Lack of Transportation (Medical): Not on file  . Lack of Transportation (Non-Medical): Not on  file  Physical Activity:   . Days of Exercise per Week: Not on file  . Minutes of Exercise per Session: Not on file  Stress:   . Feeling of Stress : Not on file  Social Connections:   . Frequency of Communication with Friends and Family: Not on file  . Frequency of Social Gatherings with Friends and Family: Not on file  . Attends Religious Services: Not on file  . Active Member of Clubs or Organizations: Not on file  . Attends Archivist Meetings: Not on file  . Marital Status: Not on file    Hospital Course: Patient was kept on 15-minute checks.  He did not display any dangerous nor aggressive nor suicidal behavior on  the unit.  Medications were adjusted to reflect what had been effective for him in the past.  I agreed to continue his clonazepam which had previously been uniquely effective and not tended to be something that he abused.  Patient has been taking it pretty sparingly here in the hospital giving me some hope that he will not abuse it at discharge.  He is engaged with [facility] and values their services.  At the time of discharge he is denying any suicidal or homicidal thought.  No psychosis.  Willing to be engaged in outpatient treatment.  Tolerating medicine well.  Physical Findings: AIMS:  , ,  ,  ,    CIWA:  CIWA-Ar Total: 0 COWS:     Musculoskeletal: Strength & Muscle Tone: within normal limits Gait & Station: normal Patient leans: N/A  Psychiatric Specialty Exam: Physical Exam Vitals and nursing note reviewed.  Constitutional:      Appearance: He is well-developed.  HENT:     Head: Normocephalic and atraumatic.  Eyes:     Conjunctiva/sclera: Conjunctivae normal.     Pupils: Pupils are equal, round, and reactive to light.  Cardiovascular:     Heart sounds: Normal heart sounds.  Pulmonary:     Effort: Pulmonary effort is normal.  Abdominal:     Palpations: Abdomen is soft.  Musculoskeletal:        General: Normal range of motion.     Cervical back: Normal range of motion.  Skin:    General: Skin is warm and dry.  Neurological:     General: No focal deficit present.     Mental Status: He is alert.  Psychiatric:        Mood and Affect: Mood normal.        Thought Content: Thought content normal.        Judgment: Judgment normal.     Review of Systems  Constitutional: Negative.   HENT: Negative.   Eyes: Negative.   Respiratory: Negative.   Cardiovascular: Negative.   Gastrointestinal: Negative.   Musculoskeletal: Negative.   Skin: Negative.   Neurological: Negative.     Blood pressure 107/78, pulse 86, temperature 97.8 F (36.6 C), temperature source Oral, resp. rate 17,  SpO2 97 %.There is no height or weight on file to calculate BMI.  General Appearance: Casual  Eye Contact:  Fair  Speech:  Normal Rate  Volume:  Normal  Mood:  Euthymic  Affect:  Congruent  Thought Process:  Goal Directed  Orientation:  Full (Time, Place, and Person)  Thought Content:  Logical  Suicidal Thoughts:  No  Homicidal Thoughts:  No  Memory:  Immediate;   Fair Recent;   Fair Remote;   Fair  Judgement:  Fair  Insight:  Fair  Psychomotor Activity:  Normal  Concentration:  Concentration: Fair  Recall:  [place] of Knowledge:  Fair  Language:  Fair  Akathisia:  No  Handed:  Right  AIMS (if indicated):     Assets:  Desire for Improvement Housing Physical Health Resilience Social Support  ADL's:  Intact  Cognition:  WNL  Sleep:  Number of Hours: 6.5     Have you used any form of tobacco in the last 30 days? (Cigarettes, Smokeless Tobacco, Cigars, and/or Pipes): Yes  Has this patient used any form of tobacco in the last 30 days? (Cigarettes, Smokeless Tobacco, Cigars, and/or Pipes) Yes, Yes, A prescription for an FDA-approved tobacco cessation medication was offered at discharge and the patient refused  Blood Alcohol level:  Lab Results  Component Value Date   ETH 137 (H) 09/05/2020   ETH <10 63/12/6008    Metabolic Disorder Labs:  Lab Results  Component Value Date   HGBA1C 7.4 (H) 08/26/2019   MPG 165.68 08/26/2019   MPG 136.98 12/13/2018   No results found for: PROLACTIN Lab Results  Component Value Date   CHOL 178 08/26/2019   TRIG 375 (H) 08/26/2019   HDL 23 (L) 08/26/2019   CHOLHDL 7.7 08/26/2019   VLDL 75 (H) 08/26/2019   [place] 80 08/26/2019   [place] 70 12/13/2018    See Psychiatric Specialty Exam and Suicide Risk Assessment completed by Attending Physician prior to discharge.  Discharge destination:  Home  Is patient on multiple antipsychotic therapies at discharge:  No   Has Patient had three or more failed trials of antipsychotic  monotherapy by history:  No  Recommended Plan for Multiple Antipsychotic Therapies: NA  Discharge Instructions    Diet - low sodium heart healthy   Complete by: As directed    Increase activity slowly   Complete by: As directed      Allergies as of 09/21/2020      Reactions   Fluoxetine Swelling   Anxiety, itching   Hydrocodone-acetaminophen Itching   Mirtazapine Other (See Comments)   nightmares   Tramadol Other (See Comments)   Gabapentin Other (See Comments)   Nightmares      Medication List    STOP taking these medications   Advair Diskus 500-50 MCG/DOSE Aepb Generic drug: Fluticasone-Salmeterol   cloNIDine 0.1 MG tablet Commonly known as: CATAPRES   omeprazole 20 MG capsule Commonly known as: PRILOSEC   Sofosbuvir-Velpatasvir 400-100 MG Tabs Commonly known as: Epclusa   thiamine 100 MG tablet     TAKE these medications     Indication  albuterol 108 (90 Base) MCG/ACT inhaler Commonly known as: VENTOLIN HFA Inhale 2 puffs into the lungs every 6 (six) hours as needed for wheezing or shortness of breath.  Indication: Asthma   aspirin 81 MG chewable tablet Chew 1 tablet (81 mg total) by mouth daily.  Indication: Inflammation, Stable Angina Pectoris   atorvastatin 40 MG tablet Commonly known as: LIPITOR Take 1 tablet (40 mg total) by mouth daily. What changed: when to take this  Indication: High Amount of Fats in the Blood   busPIRone 15 MG tablet Commonly known as: BUSPAR Take 1 tablet (15 mg total) by mouth 3 (three) times daily. What changed:   medication strength  how much to take  Indication: Anxiety Disorder, Major Depressive Disorder   clonazePAM 0.5 MG tablet Commonly known as: KLONOPIN Take 1 tablet (0.5 mg total) by mouth 2 (two) times daily as needed (Anxiety). What changed:   when  to take this  reasons to take this  Indication: Feeling Anxious   escitalopram 20 MG tablet Commonly known as: LEXAPRO Take 1 tablet (20 mg total)  by mouth daily.  Indication: Generalized Anxiety Disorder, Major Depressive Disorder   hydrochlorothiazide 25 MG tablet Commonly known as: HYDRODIURIL Take 1 tablet (25 mg total) by mouth daily.  Indication: High Blood Pressure Disorder   ibuprofen 600 MG tablet Commonly known as: ADVIL Take 1 tablet (600 mg total) by mouth every 6 (six) hours as needed for moderate pain.  Indication: Migraine Headache   lisinopril 40 MG tablet Commonly known as: ZESTRIL Take 1 tablet (40 mg total) by mouth daily. What changed:   medication strength  how much to take  Indication: High Blood Pressure Disorder   metFORMIN 500 MG 24 hr tablet Commonly known as: GLUCOPHAGE-XR Take 1 tablet (500 mg total) by mouth daily with breakfast. What changed: when to take this  Indication: Type 2 Diabetes   mometasone-formoterol 200-5 MCG/ACT Aero Commonly known as: DULERA Inhale 2 puffs into the lungs 2 (two) times daily.  Indication: Asthma   pantoprazole 40 MG tablet Commonly known as: PROTONIX Take 1 tablet (40 mg total) by mouth daily.  Indication: Inhalation of Foreign Material Into the Lungs   QUEtiapine 400 MG 24 hr tablet Commonly known as: SEROQUEL XR Take 1 tablet (400 mg total) by mouth at bedtime. What changed: Another medication with the same name was removed. Continue taking this medication, and follow the directions you see here.  Indication: Major Depressive Disorder   zolpidem 10 MG tablet Commonly known as: AMBIEN Take 1 tablet (10 mg total) by mouth at bedtime as needed for sleep. What changed:   when to take this  reasons to take this  Indication: Trouble Sleeping        Follow-up recommendations:  Activity:  Activity as tolerated Diet:  Regular diet Other:  Follow-up with [facility]  Comments: Prescriptions provided at discharge  Signed: Alethia Berthold, MD 09/21/2020, 9:08 AM

## 2020-09-21 NOTE — Progress Notes (Signed)
Patient alert and oriented x 4, affect is flat but he brightens upon no distress noted he appears less anxious he is exited and elated that he goes home tomorrow, he was noted interacting appropriately with peers and staff, he rated depression a 3/10 on a scale (0 low - 10 high) he was complaint with medication regimen, 15 minutes safety checks maintained will continue to monitor.

## 2020-09-21 NOTE — Progress Notes (Signed)
D: Pt alert and oriented. Pt denies experiencing any SI/HI, or AVH at this time. Pt reports he will be able to keep himself safe when he returns home.   A: Pt received discharge and medication education/information. Pt belongings were returned and confirmed all present by pt upon discharge.   R: Pt verbalized understanding of discharge and medication education/information.  Pt escorted by staff to medical mall front lobby where pt was picked up.

## 2020-09-21 NOTE — Progress Notes (Signed)
  [facility] Adult Case Management Discharge Plan :  Will you be returning to the same living situation after discharge:  Yes,  pt reports that she is returning home. At discharge, do you have transportation home?: Yes,  pt reports that his peer support will provide transportation.  Do you have the ability to pay for your medications: Yes,  [place] [employer]  Release of information consent forms completed and in the chart;  Patient's signature needed at discharge.  Patient to Follow up at:  Follow-up Information    [facility] Follow up.   Why: Appointment is scheduled 09/24/2020 at 12:30PM.  Thanks! Contact information: [place] [address] [phone]               Next level of care provider has access to [place] and Suicide Prevention discussed: Yes,  SPE completed with the patient's friend.  Have you used any form of tobacco in the last 30 days? (Cigarettes, Smokeless Tobacco, Cigars, and/or Pipes): Yes  Has patient been referred to the Quitline?: Patient refused referral  Patient has been referred for addiction treatment: Pt. refused referral  Rozann Lesches, LCSW 09/21/2020, 9:49 AM

## 2020-11-23 ENCOUNTER — Encounter: Payer: Self-pay | Admitting: Emergency Medicine

## 2020-11-23 ENCOUNTER — Emergency Department
Admission: EM | Admit: 2020-11-23 | Discharge: 2020-11-23 | Disposition: A | Discharge status: Home / Self Care | Payer: Medicaid Other | Attending: Emergency Medicine | Admitting: Emergency Medicine

## 2020-11-23 ENCOUNTER — Other Ambulatory Visit: Payer: Self-pay

## 2020-11-23 DIAGNOSIS — Z5321 Procedure and treatment not carried out due to patient leaving prior to being seen by health care provider: Secondary | ICD-10-CM | POA: Insufficient documentation

## 2020-11-23 DIAGNOSIS — R221 Localized swelling, mass and lump, neck: Secondary | ICD-10-CM | POA: Diagnosis present

## 2020-11-23 NOTE — ED Notes (Signed)
ED tech in room to draw pts blood. Pt stated that he was feeling better and wanted to go home.

## 2020-11-23 NOTE — ED Triage Notes (Signed)
Says he at an egg roll about 6 pm last night and then started feeling like allergic reaction with throat feeling swollen.

## 2020-11-23 NOTE — ED Triage Notes (Signed)
Pt to ED via POV. Pt states that last night after eating he noticed a large lump in his throat. Pt states that to begin with the swelling was in the left side of his face but it has since moved into his throat. Pt states that when he lays down he feels like it is taking his breath away and it is affecting his voice. Pt is speaking in complete sentences at this time and is in NAD. Pt states that he was eating a lot of different things last night.

## 2021-01-04 ENCOUNTER — Emergency Department
Admission: EM | Admit: 2021-01-04 | Discharge: 2021-01-04 | Disposition: A | Discharge status: Other Acute Inpatient Hospital | Payer: No Typology Code available for payment source | Attending: Emergency Medicine | Admitting: Emergency Medicine

## 2021-01-04 ENCOUNTER — Emergency Department: Payer: No Typology Code available for payment source

## 2021-01-04 ENCOUNTER — Other Ambulatory Visit: Payer: Self-pay

## 2021-01-04 ENCOUNTER — Inpatient Hospital Stay
Admission: RE | Admit: 2021-01-04 | Discharge: 2021-01-15 | DRG: 885 | Disposition: A | Discharge status: Home / Self Care | Payer: No Typology Code available for payment source | Source: Intra-hospital | Attending: Behavioral Health | Admitting: Behavioral Health

## 2021-01-04 DIAGNOSIS — I2510 Atherosclerotic heart disease of native coronary artery without angina pectoris: Secondary | ICD-10-CM | POA: Insufficient documentation

## 2021-01-04 DIAGNOSIS — F411 Generalized anxiety disorder: Secondary | ICD-10-CM | POA: Diagnosis present

## 2021-01-04 DIAGNOSIS — Z7982 Long term (current) use of aspirin: Secondary | ICD-10-CM | POA: Diagnosis not present

## 2021-01-04 DIAGNOSIS — Z7951 Long term (current) use of inhaled steroids: Secondary | ICD-10-CM | POA: Diagnosis not present

## 2021-01-04 DIAGNOSIS — F17200 Nicotine dependence, unspecified, uncomplicated: Secondary | ICD-10-CM | POA: Diagnosis present

## 2021-01-04 DIAGNOSIS — F418 Other specified anxiety disorders: Secondary | ICD-10-CM | POA: Insufficient documentation

## 2021-01-04 DIAGNOSIS — Z7984 Long term (current) use of oral hypoglycemic drugs: Secondary | ICD-10-CM | POA: Diagnosis not present

## 2021-01-04 DIAGNOSIS — E785 Hyperlipidemia, unspecified: Secondary | ICD-10-CM | POA: Diagnosis present

## 2021-01-04 DIAGNOSIS — F332 Major depressive disorder, recurrent severe without psychotic features: Principal | ICD-10-CM | POA: Diagnosis present

## 2021-01-04 DIAGNOSIS — Z806 Family history of leukemia: Secondary | ICD-10-CM | POA: Diagnosis not present

## 2021-01-04 DIAGNOSIS — R45851 Suicidal ideations: Secondary | ICD-10-CM | POA: Diagnosis not present

## 2021-01-04 DIAGNOSIS — Z9151 Personal history of suicidal behavior: Secondary | ICD-10-CM

## 2021-01-04 DIAGNOSIS — J449 Chronic obstructive pulmonary disease, unspecified: Secondary | ICD-10-CM | POA: Diagnosis not present

## 2021-01-04 DIAGNOSIS — Z823 Family history of stroke: Secondary | ICD-10-CM | POA: Diagnosis not present

## 2021-01-04 DIAGNOSIS — I10 Essential (primary) hypertension: Secondary | ICD-10-CM | POA: Diagnosis not present

## 2021-01-04 DIAGNOSIS — Z79899 Other long term (current) drug therapy: Secondary | ICD-10-CM | POA: Insufficient documentation

## 2021-01-04 DIAGNOSIS — J45909 Unspecified asthma, uncomplicated: Secondary | ICD-10-CM | POA: Diagnosis present

## 2021-01-04 DIAGNOSIS — E1165 Type 2 diabetes mellitus with hyperglycemia: Secondary | ICD-10-CM | POA: Diagnosis present

## 2021-01-04 DIAGNOSIS — Z20822 Contact with and (suspected) exposure to covid-19: Secondary | ICD-10-CM | POA: Diagnosis not present

## 2021-01-04 DIAGNOSIS — F1011 Alcohol abuse, in remission: Secondary | ICD-10-CM | POA: Diagnosis present

## 2021-01-04 DIAGNOSIS — F17210 Nicotine dependence, cigarettes, uncomplicated: Secondary | ICD-10-CM | POA: Diagnosis present

## 2021-01-04 DIAGNOSIS — F419 Anxiety disorder, unspecified: Secondary | ICD-10-CM | POA: Diagnosis present

## 2021-01-04 DIAGNOSIS — Z803 Family history of malignant neoplasm of breast: Secondary | ICD-10-CM | POA: Diagnosis not present

## 2021-01-04 DIAGNOSIS — F339 Major depressive disorder, recurrent, unspecified: Secondary | ICD-10-CM | POA: Diagnosis present

## 2021-01-04 DIAGNOSIS — F329 Major depressive disorder, single episode, unspecified: Secondary | ICD-10-CM | POA: Diagnosis present

## 2021-01-04 LAB — CBC
HCT: 50 % (ref 39.0–52.0)
Hemoglobin: 15.9 g/dL (ref 13.0–17.0)
MCH: 28.8 pg (ref 26.0–34.0)
MCHC: 31.8 g/dL (ref 30.0–36.0)
MCV: 90.4 fL (ref 80.0–100.0)
Platelets: 175 10*3/uL (ref 150–400)
RBC: 5.53 MIL/uL (ref 4.22–5.81)
RDW: 12.5 % (ref 11.5–15.5)
WBC: 10.6 10*3/uL — ABNORMAL HIGH (ref 4.0–10.5)
nRBC: 0 % (ref 0.0–0.2)

## 2021-01-04 LAB — SALICYLATE LEVEL: Salicylate Lvl: <7 mg/dL — ABNORMAL LOW (ref 7.0–30.0)

## 2021-01-04 LAB — COMPREHENSIVE METABOLIC PANEL
ALT: 35 U/L (ref 0–44)
AST: 27 U/L (ref 15–41)
Albumin: 4.2 g/dL (ref 3.5–5.0)
Alkaline Phosphatase: 67 U/L (ref 38–126)
Anion gap: 14 (ref 5–15)
BUN: 12 mg/dL (ref 6–20)
CO2: 28 mmol/L (ref 22–32)
Calcium: 9.3 mg/dL (ref 8.9–10.3)
Chloride: 97 mmol/L — ABNORMAL LOW (ref 98–111)
Creatinine, Ser: 0.99 mg/dL (ref 0.61–1.24)
GFR, Estimated: >60 mL/min (ref >60)
Glucose, Bld: 305 mg/dL — ABNORMAL HIGH (ref 70–99)
Potassium: 4.1 mmol/L (ref 3.5–5.1)
Sodium: 139 mmol/L (ref 135–145)
Total Bilirubin: 0.6 mg/dL (ref 0.3–1.2)
Total Protein: 7.6 g/dL (ref 6.5–8.1)

## 2021-01-04 LAB — ACETAMINOPHEN LEVEL: Acetaminophen (Tylenol), Serum: <10 ug/mL — ABNORMAL LOW (ref 10–30)

## 2021-01-04 LAB — URINE DRUG SCREEN, QUALITATIVE (ARMC ONLY)
Amphetamines, Ur Screen: NOT DETECTED
Barbiturates, Ur Screen: NOT DETECTED
Benzodiazepine, Ur Scrn: NOT DETECTED
Cannabinoid 50 Ng, Ur ~~LOC~~: NOT DETECTED
Cocaine Metabolite,Ur ~~LOC~~: NOT DETECTED
MDMA (Ecstasy)Ur Screen: NOT DETECTED
Methadone Scn, Ur: NOT DETECTED
Opiate, Ur Screen: NOT DETECTED
Phencyclidine (PCP) Ur S: NOT DETECTED
Tricyclic, Ur Screen: POSITIVE — AB

## 2021-01-04 LAB — RESP PANEL BY RT-PCR (FLU A&B, COVID) ARPGX2
Influenza A by PCR: NEGATIVE
Influenza B by PCR: NEGATIVE
SARS Coronavirus 2 by RT PCR: NEGATIVE

## 2021-01-04 LAB — ETHANOL: Alcohol, Ethyl (B): <10 mg/dL (ref <10)

## 2021-01-04 MED ORDER — HYDROCHLOROTHIAZIDE 25 MG PO TABS
25.0000 mg | ORAL_TABLET | Freq: Every day | ORAL | Status: DC
  Administered 2021-01-05 – 2021-01-15 (×11): 25 mg via ORAL
  Filled 2021-01-04 (×11): qty 1

## 2021-01-04 MED ORDER — BUSPIRONE HCL 5 MG PO TABS
15.0000 mg | ORAL_TABLET | Freq: Three times a day (TID) | ORAL | Status: DC
  Administered 2021-01-05: 15 mg via ORAL
  Filled 2021-01-04: qty 3

## 2021-01-04 MED ORDER — ESCITALOPRAM OXALATE 10 MG PO TABS
20.0000 mg | ORAL_TABLET | Freq: Every day | ORAL | Status: DC
  Administered 2021-01-05 – 2021-01-15 (×11): 20 mg via ORAL
  Filled 2021-01-04 (×11): qty 2

## 2021-01-04 MED ORDER — QUETIAPINE FUMARATE ER 200 MG PO TB24
400.0000 mg | ORAL_TABLET | Freq: Every day | ORAL | Status: DC
  Administered 2021-01-05: 400 mg via ORAL
  Filled 2021-01-04 (×2): qty 2

## 2021-01-04 MED ORDER — ATORVASTATIN CALCIUM 20 MG PO TABS
40.0000 mg | ORAL_TABLET | Freq: Every day | ORAL | Status: DC
  Administered 2021-01-04: 40 mg via ORAL
  Filled 2021-01-04: qty 2

## 2021-01-04 MED ORDER — ALBUTEROL SULFATE HFA 108 (90 BASE) MCG/ACT IN AERS
2.0000 | INHALATION_SPRAY | RESPIRATORY_TRACT | Status: DC | PRN
  Filled 2021-01-04: qty 6.7

## 2021-01-04 MED ORDER — MOMETASONE FURO-FORMOTEROL FUM 200-5 MCG/ACT IN AERO
2.0000 | INHALATION_SPRAY | Freq: Two times a day (BID) | RESPIRATORY_TRACT | Status: DC
  Filled 2021-01-04: qty 8.8

## 2021-01-04 MED ORDER — HYDROXYZINE HCL 25 MG PO TABS
25.0000 mg | ORAL_TABLET | Freq: Three times a day (TID) | ORAL | Status: DC | PRN
  Administered 2021-01-05 (×2): 25 mg via ORAL
  Filled 2021-01-04 (×2): qty 1

## 2021-01-04 MED ORDER — CLONAZEPAM 0.5 MG PO TABS
0.5000 mg | ORAL_TABLET | Freq: Two times a day (BID) | ORAL | Status: DC | PRN
  Administered 2021-01-05 – 2021-01-15 (×8): 0.5 mg via ORAL
  Filled 2021-01-04 (×8): qty 1

## 2021-01-04 MED ORDER — MOMETASONE FURO-FORMOTEROL FUM 200-5 MCG/ACT IN AERO
2.0000 | INHALATION_SPRAY | Freq: Two times a day (BID) | RESPIRATORY_TRACT | Status: DC
  Administered 2021-01-05 – 2021-01-15 (×20): 2 via RESPIRATORY_TRACT
  Filled 2021-01-04: qty 8.8

## 2021-01-04 MED ORDER — ALBUTEROL SULFATE HFA 108 (90 BASE) MCG/ACT IN AERS
2.0000 | INHALATION_SPRAY | RESPIRATORY_TRACT | Status: DC | PRN
  Administered 2021-01-05 – 2021-01-06 (×3): 2 via RESPIRATORY_TRACT
  Filled 2021-01-04: qty 6.7

## 2021-01-04 MED ORDER — ASPIRIN EC 81 MG PO TBEC
81.0000 mg | DELAYED_RELEASE_TABLET | Freq: Every day | ORAL | Status: DC
  Administered 2021-01-05 – 2021-01-15 (×11): 81 mg via ORAL
  Filled 2021-01-04 (×11): qty 1

## 2021-01-04 MED ORDER — IBUPROFEN 600 MG PO TABS
600.0000 mg | ORAL_TABLET | Freq: Once | ORAL | Status: AC
  Administered 2021-01-04: 600 mg via ORAL
  Filled 2021-01-04: qty 1

## 2021-01-04 MED ORDER — ALBUTEROL SULFATE HFA 108 (90 BASE) MCG/ACT IN AERS
2.0000 | INHALATION_SPRAY | Freq: Four times a day (QID) | RESPIRATORY_TRACT | Status: DC | PRN
  Filled 2021-01-04 (×2): qty 6.7

## 2021-01-04 MED ORDER — METFORMIN HCL ER 500 MG PO TB24
500.0000 mg | ORAL_TABLET | Freq: Every day | ORAL | Status: DC
  Filled 2021-01-04: qty 1

## 2021-01-04 MED ORDER — MAGNESIUM HYDROXIDE 400 MG/5ML PO SUSP: 30.0000 mL | Freq: Every day | ORAL | Status: DC | PRN

## 2021-01-04 MED ORDER — CLONAZEPAM 0.5 MG PO TBDP
0.5000 mg | ORAL_TABLET | ORAL | Status: AC
  Administered 2021-01-04: 0.5 mg via ORAL
  Filled 2021-01-04: qty 1

## 2021-01-04 MED ORDER — PANTOPRAZOLE SODIUM 40 MG PO TBEC
40.0000 mg | DELAYED_RELEASE_TABLET | Freq: Every day | ORAL | Status: DC
  Administered 2021-01-04: 40 mg via ORAL
  Filled 2021-01-04: qty 1

## 2021-01-04 MED ORDER — CLONAZEPAM 0.5 MG PO TABS: 0.5000 mg | ORAL_TABLET | Freq: Two times a day (BID) | ORAL | Status: DC | PRN

## 2021-01-04 MED ORDER — ZOLPIDEM TARTRATE 5 MG PO TABS
10.0000 mg | ORAL_TABLET | Freq: Every day | ORAL | Status: DC
  Administered 2021-01-05 – 2021-01-14 (×10): 10 mg via ORAL
  Filled 2021-01-04 (×10): qty 2

## 2021-01-04 MED ORDER — PANTOPRAZOLE SODIUM 40 MG PO TBEC
40.0000 mg | DELAYED_RELEASE_TABLET | Freq: Every day | ORAL | Status: DC
  Administered 2021-01-05 – 2021-01-15 (×11): 40 mg via ORAL
  Filled 2021-01-04 (×11): qty 1

## 2021-01-04 MED ORDER — QUETIAPINE FUMARATE ER 200 MG PO TB24
400.0000 mg | ORAL_TABLET | Freq: Every day | ORAL | Status: DC
  Filled 2021-01-04: qty 2

## 2021-01-04 MED ORDER — LISINOPRIL 10 MG PO TABS
40.0000 mg | ORAL_TABLET | Freq: Every day | ORAL | Status: DC
  Administered 2021-01-04: 40 mg via ORAL
  Filled 2021-01-04: qty 4

## 2021-01-04 MED ORDER — ESCITALOPRAM OXALATE 10 MG PO TABS
20.0000 mg | ORAL_TABLET | Freq: Every day | ORAL | Status: DC
  Administered 2021-01-04: 20 mg via ORAL
  Filled 2021-01-04: qty 2

## 2021-01-04 MED ORDER — ZOLPIDEM TARTRATE 5 MG PO TABS: 10.0000 mg | ORAL_TABLET | Freq: Every day | ORAL | Status: DC

## 2021-01-04 MED ORDER — ASPIRIN EC 81 MG PO TBEC
81.0000 mg | DELAYED_RELEASE_TABLET | Freq: Every day | ORAL | Status: DC
  Administered 2021-01-04: 81 mg via ORAL
  Filled 2021-01-04: qty 1

## 2021-01-04 MED ORDER — ATORVASTATIN CALCIUM 20 MG PO TABS
40.0000 mg | ORAL_TABLET | Freq: Every day | ORAL | Status: DC
  Administered 2021-01-05 – 2021-01-15 (×11): 40 mg via ORAL
  Filled 2021-01-04 (×12): qty 2

## 2021-01-04 MED ORDER — LISINOPRIL 20 MG PO TABS
40.0000 mg | ORAL_TABLET | Freq: Every day | ORAL | Status: DC
  Administered 2021-01-05 – 2021-01-15 (×11): 40 mg via ORAL
  Filled 2021-01-04 (×11): qty 2

## 2021-01-04 MED ORDER — ALUM & MAG HYDROXIDE-SIMETH 200-200-20 MG/5ML PO SUSP: 30.0000 mL | ORAL | Status: DC | PRN

## 2021-01-04 MED ORDER — BUSPIRONE HCL 5 MG PO TABS
15.0000 mg | ORAL_TABLET | Freq: Three times a day (TID) | ORAL | Status: DC
  Administered 2021-01-04: 15 mg via ORAL
  Filled 2021-01-04: qty 3

## 2021-01-04 MED ORDER — HYDROCHLOROTHIAZIDE 25 MG PO TABS
25.0000 mg | ORAL_TABLET | Freq: Every day | ORAL | Status: DC
  Administered 2021-01-04: 25 mg via ORAL
  Filled 2021-01-04: qty 1

## 2021-01-04 MED ORDER — METFORMIN HCL ER 500 MG PO TB24
500.0000 mg | ORAL_TABLET | Freq: Every day | ORAL | Status: DC
Start: 2021-01-05 — End: 2021-01-08
  Administered 2021-01-05 – 2021-01-08 (×4): 500 mg via ORAL
  Filled 2021-01-04 (×4): qty 1
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
## 2021-01-04 NOTE — ED Triage Notes (Signed)
Pt reports that he is feeling suicidal today and "depressed". Denies suicidal attempt. Complaint with his medications per report. Denies HI.

## 2021-01-04 NOTE — BH Assessment (Incomplete)
PATIENT BED AVAILABLE AFTER 10PM AND PENDING NEGATIVE COVID   Patient is to be admitted to [facility] by Psychiatric Nurse Practitioner Weippe DixonAttending Physician will be Dr. Selina Cooley.   Patient has been assigned to room 320, by [facility] Charge Nurse.   Intake Paper Work has been signed and placed on patient chart.  ER staff is aware of the admission:  [facility] ER Secretary    Dr. Ellender Hose, ER MD   Ena Dawley Patient's Nurse   Patient has already been pre-admitted

## 2021-01-04 NOTE — ED Notes (Addendum)
Personal belongings placed into labeled bags. Include black jacket, tennis shoes, blue striped collared shirt, navy shorts, grey sweat pants, cell phone, cigarettes, lighter, wallet with cash consisting of: 1- $20, 1-$10, 5-$1 bills  [place] [phone] [place] [phone]

## 2021-01-04 NOTE — ED Notes (Signed)
Pt irritated he has not seen the EDP.  Pt has been seen by Dr. Weber Cooks.  This Probation officer explained the doctor has been very busy with critically ill patients.

## 2021-01-05 ENCOUNTER — Encounter: Payer: Self-pay | Admitting: Psychiatry

## 2021-01-05 DIAGNOSIS — F332 Major depressive disorder, recurrent severe without psychotic features: Secondary | ICD-10-CM | POA: Diagnosis not present

## 2021-01-05 MED ORDER — NICOTINE 21 MG/24HR TD PT24
21.0000 mg | MEDICATED_PATCH | Freq: Every day | TRANSDERMAL | Status: DC
  Administered 2021-01-05 – 2021-01-07 (×3): 21 mg via TRANSDERMAL
  Filled 2021-01-05 (×6): qty 1

## 2021-01-05 MED ORDER — IBUPROFEN 200 MG PO TABS
400.0000 mg | ORAL_TABLET | ORAL | Status: DC | PRN
  Administered 2021-01-05 – 2021-01-15 (×9): 400 mg via ORAL
  Filled 2021-01-05 (×9): qty 2

## 2021-01-05 MED ORDER — BUSPIRONE HCL 5 MG PO TABS
20.0000 mg | ORAL_TABLET | Freq: Three times a day (TID) | ORAL | Status: DC
  Administered 2021-01-05 – 2021-01-15 (×31): 20 mg via ORAL
  Filled 2021-01-05 (×31): qty 4

## 2021-01-05 NOTE — BHH Suicide Risk Assessment (Signed)
[place] INPATIENT:  Family/Significant Other Suicide Prevention Education  Suicide Prevention Education:  Patient Refusal for Family/Significant Other Suicide Prevention Education: The patient Ian [name] has refused to provide written consent for family/significant other to be provided Family/Significant Other Suicide Prevention Education during admission and/or prior to discharge.  Physician notified.  CSW went over SPE with patient and provided a brochure.   Larene Beach  Izyan Ezzell 01/05/2021, 4:29 PM
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
## 2021-01-05 NOTE — Plan of Care (Signed)
Patient stayed in bed in AM reporting that he did not get enough rest last night. Got up for lunch and has stayed in the milieu since. Cooperative but anxious and restless. Taking medications as prescribed. Appetite is good. Patient denies SI/HI/AVH. Encouragements provided and safety maintained.

## 2021-01-05 NOTE — BHH Group Notes (Signed)
[facility] Group Notes: (Clinical Social Work)   01/05/2021      Type of Therapy:  Group Therapy   Participation Level:  Did Not Attend - was invited individually by Nurse/MHT and chose not to attend.   Raina Mina, [place] 01/05/2021  4:29 PM

## 2021-01-05 NOTE — Plan of Care (Signed)
  Problem: Education: Goal: Ability to make informed decisions regarding treatment will improve Outcome: Not Progressing   Problem: Coping: Goal: Coping ability will improve Outcome: Not Progressing   Problem: Health Behavior/Discharge Planning: Goal: Identification of resources available to assist in meeting health care needs will improve Outcome: Not Progressing   Problem: Medication: Goal: Compliance with prescribed medication regimen will improve Outcome: Not Progressing   Problem: Self-Concept: Goal: Ability to disclose and discuss suicidal ideas will improve Outcome: Not Progressing Goal: Will verbalize positive feelings about self Outcome: Not Progressing   Problem: Education: Goal: Knowledge of [facility] Education information/materials will improve Outcome: Not Progressing Goal: Emotional status will improve Outcome: Not Progressing Goal: Mental status will improve Outcome: Not Progressing Goal: Verbalization of understanding the information provided will improve Outcome: Not Progressing   Problem: Activity: Goal: Interest or engagement in activities will improve Outcome: Not Progressing Goal: Sleeping patterns will improve Outcome: Not Progressing   Problem: Coping: Goal: Ability to verbalize frustrations and anger appropriately will improve Outcome: Not Progressing Goal: Ability to demonstrate self-control will improve Outcome: Not Progressing   Problem: Health Behavior/Discharge Planning: Goal: Identification of resources available to assist in meeting health care needs will improve Outcome: Not Progressing Goal: Compliance with treatment plan for underlying cause of condition will improve Outcome: Not Progressing   Problem: Physical Regulation: Goal: Ability to maintain clinical measurements within normal limits will improve Outcome: Not Progressing   Problem: Safety: Goal: Periods of time without injury will increase Outcome: Not  Progressing   Problem: Education: Goal: Ability to make informed decisions regarding treatment will improve Outcome: Not Progressing   Problem: Coping: Goal: Coping ability will improve Outcome: Not Progressing   Problem: Health Behavior/Discharge Planning: Goal: Identification of resources available to assist in meeting health care needs will improve Outcome: Not Progressing   Problem: Medication: Goal: Compliance with prescribed medication regimen will improve Outcome: Not Progressing   Problem: Self-Concept: Goal: Ability to disclose and discuss suicidal ideas will improve Outcome: Not Progressing Goal: Will verbalize positive feelings about self Outcome: Not Progressing
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
## 2021-01-05 NOTE — [facility] Suicide Risk Assessment (Signed)
[facility] Admission Suicide Risk Assessment   Nursing information obtained from:  Patient Demographic factors:  Male,Caucasian,Low socioeconomic status,Living alone,Unemployed Current Mental Status:  Suicidal ideation indicated by patient Loss Factors:  Decrease in vocational status,Decline in physical health,Financial problems / change in socioeconomic status Historical Factors:  Family history of mental illness or substance abuse,Impulsivity Risk Reduction Factors:  Positive social support,Positive therapeutic relationship  Total Time spent with patient: 61 minutes Principal Problem: <principal problem not specified> Diagnosis:  Active Problems:   MDD (major depressive disorder)  Subjective Data:   IanSimmonsis a 60y.o. male that has a previous psychiatric history ofdepressionwho presents to the [facility] unit for treatment of depression and suicidal ideations.   CLINICAL FACTORS:   Depression:   Severe    COGNITIVE FEATURES THAT CONTRIBUTE TO RISK:  None    SUICIDE RISK:   Moderate:  Frequent suicidal ideation with limited intensity, and duration, some specificity in terms of plans, no associated intent, good self-control, limited dysphoria/symptomatology, some risk factors present, and identifiable protective factors, including available and accessible social support.  Patient continues to express suicidal ideations currently. He has a history of past suicidal attempts, limited social support, unclear housing situation.  PLAN OF CARE:  -inpatient psychiatric admission will be continued. -patient will be integrated in the milieu.  -patient will be encouraged to attend groups.   -Medications: the combination of his outpatient psychotropic medications and their doses seem appropriate. We will continue Seroquel 400mg  at bedtime for mood stability and sleep, lexapro 20mg  daily for depression and anxiety, Ambien 10mg  at night for sleep, Klonopin 0.5mg  twice daily as needed for anxiety.  I will increase the dose of Buspirone for anxiety from 15mg  TID to 20mg  TID.    -Disposition will be determined after the patient is stabilized. We will have to find out what his current housing situation is.   I certify that inpatient services furnished can reasonably be expected to improve the patient's condition.   Larita Fife, MD 01/05/2021, 9:52 AM

## 2021-01-05 NOTE — Progress Notes (Signed)
Admission Note: 60yr old white male presents to the unit voluntarily after being assessed in the ED for depression and SI. Upon arrival on the unit patient is pleasant and cooperative. Patient is alert and oriented x 4 with clear and logical thought and speech. Patient denies SI  HI AVH at this encounter, but was very forthcoming about the passive SI thoughts he has when he thinks about his current life and living situations. Patient verbally contracted for safety. He  signed all required admission documents. No contraband found on person or in belongings. Skin assessment unremarkable with no surgical scars or bruises to note.  Patient acclimated to the unit and provided snack and drink. QHS medication provided and inhaler to treat shortness of breath complaint. Patient encouraged to come to staff with any questions or concerns. Patient continues to be monitored every 15 minutes for progress and safety.      Cleo Butler-Nicholson, LPN

## 2021-01-06 DIAGNOSIS — F332 Major depressive disorder, recurrent severe without psychotic features: Secondary | ICD-10-CM | POA: Diagnosis not present

## 2021-01-06 LAB — GLUCOSE, CAPILLARY
Glucose-Capillary: 243 mg/dL — ABNORMAL HIGH (ref 70–99)
Glucose-Capillary: 130 mg/dL — ABNORMAL HIGH (ref 70–99)
Glucose-Capillary: 236 mg/dL — ABNORMAL HIGH (ref 70–99)

## 2021-01-06 MED ORDER — QUETIAPINE FUMARATE ER 200 MG PO TB24
400.0000 mg | ORAL_TABLET | Freq: Every day | ORAL | Status: DC
  Administered 2021-01-06 – 2021-01-14 (×9): 400 mg via ORAL
  Filled 2021-01-06 (×11): qty 2

## 2021-01-06 MED ORDER — INSULIN ASPART 100 UNIT/ML ~~LOC~~ SOLN
0.0000 [IU] | SUBCUTANEOUS | Status: DC
  Administered 2021-01-06 (×2): 8 [IU] via SUBCUTANEOUS
  Administered 2021-01-06 – 2021-01-07 (×2): 2 [IU] via SUBCUTANEOUS
  Administered 2021-01-07 – 2021-01-08 (×4): 4 [IU] via SUBCUTANEOUS
  Filled 2021-01-06 (×8): qty 1

## 2021-01-06 NOTE — Plan of Care (Signed)
Patient is pleasant and cooperative on approach.Patient states his "anxiety is manageable but I am worried about my trailer." Patient stated that he slept good last night. Denies SI,HI and AVH.Compliant with medications.Visible in the milieu.Appetite and energy level good.Support and encouragement given.

## 2021-01-06 NOTE — Progress Notes (Signed)
D: Patient denies SI/HI/AVH.  Patient affect and mood are anxious.  Patient did attend evening group. No distress noted. A: Support and encouragement offered. Scheduled medications given to pt. Q 15 min checks continued for patient safety. R: Patient receptive. Patient remains safe on the unit.

## 2021-01-06 NOTE — Progress Notes (Signed)
[facility] MD Progress Note  01/06/2021 9:11 AM Ian [name]  MRN:  [id number]  Principal Problem: <principal problem not specified> Diagnosis: Active Problems:   MDD (major depressive disorder)  Total Time spent with patient: 15 minutes  [name] a 59y.o. male that has a previous psychiatric history ofdepressionwho presents to the [facility] unit for treatment of depression and suicidal ideations.  Interval History Patient was seen today for re-evaluation.  Nursing reports no events overnight. The patient has no issues with performing ADLs.  Patient has been medication compliant.    Subjective:  On assessment patient reports "feeling better" - not anymore suicidal, slept better. He is still depressed and anxious due to unclear housing situation, especially taking in consideration current winter weather. He again denies thoughts of harming self, others; denies hallucinations, does not express delusions. No side effects from current medications, including from increased dose of Buspirone.   Labs: no new results for review.     Past Psychiatric History: see H&P  Past Medical History:  Past Medical History:  Diagnosis Date  . Anxiety   . Barrett esophagus   . Cancer ([place])   . Coronary artery disease   . Depression   . GERD (gastroesophageal reflux disease)   . Hypertension   . Pre-diabetes   . Stroke [facility] 2014   "mini-stroke" per patient    Past Surgical History:  Procedure Laterality Date  . ANGIOPLASTY    . APPENDECTOMY    . CARDIAC CATHETERIZATION    . CHOLECYSTECTOMY    . WRIST SURGERY Right    age 75   Family History:  Family History  Problem Relation Age of Onset  . Stroke Father   . Leukemia Father   . Breast cancer Sister    Family Psychiatric  History: see H&P Social History:  Social History   Substance and Sexual Activity  Alcohol Use Not Currently     Social History   Substance and Sexual Activity  Drug Use Yes  . Types: Cocaine, Marijuana     Social History   Socioeconomic History  . Marital status: Divorced    Spouse name: Not on file  . Number of children: Not on file  . Years of education: Not on file  . Highest education level: Not on file  Occupational History  . Not on file  Tobacco Use  . Smoking status: Current Every Day Smoker    Packs/day: 1.00    Types: Cigarettes  . Smokeless tobacco: Never Used  Vaping Use  . Vaping Use: Never used  Substance and Sexual Activity  . Alcohol use: Not Currently  . Drug use: Yes    Types: Cocaine, Marijuana  . Sexual activity: Not on file  Other Topics Concern  . Not on file  Social History Narrative  . Not on file   Social Determinants of Health   Financial Resource Strain: Not on file  Food Insecurity: Not on file  Transportation Needs: Not on file  Physical Activity: Not on file  Stress: Not on file  Social Connections: Not on file   Additional Social History:                         Sleep: Good  Appetite:  Good  Current Medications: Current Facility-Administered Medications  Medication Dose Route Frequency Provider Last Rate Last Admin  . albuterol (VENTOLIN HFA) 108 (90 Base) MCG/ACT inhaler 2 puff  2 puff Inhalation Q4H PRN Deloria Lair, NP  2 puff at 01/05/21 2109  . alum & mag hydroxide-simeth (MAALOX/MYLANTA) 200-200-20 MG/5ML suspension 30 mL  30 mL Oral Q4H PRN Dixon, Rashaun M, NP      . aspirin EC tablet 81 mg  81 mg Oral Daily Dixon, Rashaun M, NP   81 mg at 01/06/21 0817  . atorvastatin (LIPITOR) tablet 40 mg  40 mg Oral Daily Dixon, Rashaun M, NP   40 mg at 01/06/21 0817  . busPIRone (BUSPAR) tablet 20 mg  20 mg Oral TID Larita Fife, MD   20 mg at 01/06/21 0817  . clonazePAM (KLONOPIN) tablet 0.5 mg  0.5 mg Oral BID PRN Deloria Lair, NP   0.5 mg at 01/05/21 2114  . escitalopram (LEXAPRO) tablet 20 mg  20 mg Oral Daily Dixon, Rashaun M, NP   20 mg at 01/06/21 0817  . hydrochlorothiazide (HYDRODIURIL) tablet 25 mg  25 mg  Oral Daily Dixon, Rashaun M, NP   25 mg at 01/06/21 0817  . hydrOXYzine (ATARAX/VISTARIL) tablet 25 mg  25 mg Oral TID PRN Deloria Lair, NP   25 mg at 01/05/21 2113  . ibuprofen (ADVIL) tablet 400 mg  400 mg Oral Q4H PRN Larita Fife, MD   400 mg at 01/05/21 1225  . lisinopril (ZESTRIL) tablet 40 mg  40 mg Oral Daily Dixon, Rashaun M, NP   40 mg at 01/06/21 0817  . magnesium hydroxide (MILK OF MAGNESIA) suspension 30 mL  30 mL Oral Daily PRN Deloria Lair, NP      . metFORMIN (GLUCOPHAGE-XR) 24 hr tablet 500 mg  500 mg Oral Q breakfast Dixon, Rashaun M, NP   500 mg at 01/06/21 0818  . mometasone-formoterol (DULERA) 200-5 MCG/ACT inhaler 2 puff  2 puff Inhalation BID Dixon, Rashaun M, NP   2 puff at 01/06/21 0819  . nicotine (NICODERM CQ - dosed in mg/24 hours) patch 21 mg  21 mg Transdermal Daily Larita Fife, MD   21 mg at 01/06/21 0818  . pantoprazole (PROTONIX) EC tablet 40 mg  40 mg Oral Daily Dixon, Rashaun M, NP   40 mg at 01/06/21 0817  . QUEtiapine (SEROQUEL XR) 24 hr tablet 400 mg  400 mg Oral QHS Dixon, Rashaun M, NP   400 mg at 01/05/21 2138  . zolpidem (AMBIEN) tablet 10 mg  10 mg Oral QHS Deloria Lair, NP   10 mg at 01/05/21 2141    Lab Results:  Results for orders placed or performed during the hospital encounter of 01/04/21 (from the past 48 hour(s))  Comprehensive metabolic panel     Status: Abnormal   Collection Time: 01/04/21  1:19 PM  Result Value Ref Range   Sodium 139 135 - 145 mmol/L   Potassium 4.1 3.5 - 5.1 mmol/L   Chloride 97 (L) 98 - 111 mmol/L   CO2 28 22 - 32 mmol/L   Glucose, Bld 305 (H) 70 - 99 mg/dL    Comment: Glucose reference range applies only to samples taken after fasting for at least 8 hours.   BUN 12 6 - 20 mg/dL   Creatinine, Ser 0.99 0.61 - 1.24 mg/dL   Calcium 9.3 8.9 - 10.3 mg/dL   Total Protein 7.6 6.5 - 8.1 g/dL   Albumin 4.2 3.5 - 5.0 g/dL   AST 27 15 - 41 U/L   ALT 35 0 - 44 U/L   Alkaline Phosphatase 67 38 - 126 U/L   Total  Bilirubin 0.6 0.3 - 1.2  mg/dL   GFR, Estimated >60 >60 mL/min    Comment: (NOTE) Calculated using the CKD-EPI Creatinine Equation (2021)    Anion gap 14 5 - 15    Comment: Performed at [facility], [address] [place], [place] [address]  Ethanol     Status: None   Collection Time: 01/04/21  1:19 PM  Result Value Ref Range   Alcohol, Ethyl (B) <10 <10 mg/dL    Comment: (NOTE) Lowest detectable limit for serum alcohol is 10 mg/dL.  For medical purposes only. Performed at [facility], [address] [place], [place] [address]   Salicylate level     Status: Abnormal   Collection Time: 01/04/21  1:19 PM  Result Value Ref Range   Salicylate Lvl <0.2 (L) 7.0 - 30.0 mg/dL    Comment: Performed at [facility], [address] [place], [place] [address]  Acetaminophen level     Status: Abnormal   Collection Time: 01/04/21  1:19 PM  Result Value Ref Range   Acetaminophen (Tylenol), Serum <10 (L) 10 - 30 ug/mL    Comment: (NOTE) Therapeutic concentrations vary significantly. A range of 10-30 ug/mL  may be an effective concentration for many patients. However, some  are best treated at concentrations outside of this range. Acetaminophen concentrations >150 ug/mL at 4 hours after ingestion  and >50 ug/mL at 12 hours after ingestion are often associated with  toxic reactions.  Performed at [facility], [address] [place], [place] [address]   cbc     Status: Abnormal   Collection Time: 01/04/21  1:19 PM  Result Value Ref Range   WBC 10.6 (H) 4.0 - 10.5 K/uL   RBC 5.53 4.22 - 5.81 MIL/uL   Hemoglobin 15.9 13.0 - 17.0 g/dL   HCT 50.0 39.0 - 52.0 %   MCV 90.4 80.0 - 100.0 fL   MCH 28.8 26.0 - 34.0 pg   MCHC 31.8 30.0 - 36.0 g/dL   RDW 12.5 11.5 - 15.5 %   Platelets 175 150 - 400 K/uL   nRBC 0.0 0.0 - 0.2 %    Comment: Performed at [facility], [address], [place], [place] [address]  Urine Drug Screen, Qualitative      Status: Abnormal   Collection Time: 01/04/21  1:19 PM  Result Value Ref Range   Tricyclic, Ur Screen POSITIVE (A) NONE DETECTED   Amphetamines, Ur Screen NONE DETECTED NONE DETECTED   MDMA (Ecstasy)Ur Screen NONE DETECTED NONE DETECTED   Cocaine Metabolite,Ur [place] NONE DETECTED NONE DETECTED   Opiate, Ur Screen NONE DETECTED NONE DETECTED   Phencyclidine (PCP) Ur S NONE DETECTED NONE DETECTED   Cannabinoid 50 Ng, Ur [place] NONE DETECTED NONE DETECTED   Barbiturates, Ur Screen NONE DETECTED NONE DETECTED   Benzodiazepine, Ur Scrn NONE DETECTED NONE DETECTED   Methadone Scn, Ur NONE DETECTED NONE DETECTED    Comment: (NOTE) Tricyclics + metabolites, urine    Cutoff 1000 ng/mL Amphetamines + metabolites, urine  Cutoff 1000 ng/mL MDMA (Ecstasy), urine              Cutoff 500 ng/mL Cocaine Metabolite, urine          Cutoff 300 ng/mL Opiate + metabolites, urine        Cutoff 300 ng/mL Phencyclidine (PCP), urine         Cutoff 25 ng/mL Cannabinoid, urine  Cutoff 50 ng/mL Barbiturates + metabolites, urine  Cutoff 200 ng/mL Benzodiazepine, urine              Cutoff 200 ng/mL Methadone, urine                   Cutoff 300 ng/mL  The urine drug screen provides only a preliminary, unconfirmed analytical test result and should not be used for non-medical purposes. Clinical consideration and professional judgment should be applied to any positive drug screen result due to possible interfering substances. A more specific alternate chemical method must be used in order to obtain a confirmed analytical result. Gas chromatography / mass spectrometry (GC/MS) is the preferred confirm atory method. Performed at [facility], [address] [place], [place] [address]   Resp Panel by RT-PCR (Flu A&B, Covid) Nasopharyngeal Swab     Status: None   Collection Time: 01/04/21  8:42 PM   Specimen: Nasopharyngeal Swab; Nasopharyngeal(NP) swabs in vial transport medium  Result Value Ref  Range   SARS Coronavirus 2 by RT PCR NEGATIVE NEGATIVE    Comment: (NOTE) SARS-CoV-2 target nucleic acids are NOT DETECTED.  The SARS-CoV-2 RNA is generally detectable in upper respiratory specimens during the acute phase of infection. The lowest concentration of SARS-CoV-2 viral copies this assay can detect is 138 copies/mL. A negative result does not preclude SARS-Cov-2 infection and should not be used as the sole basis for treatment or other patient management decisions. A negative result may occur with  improper specimen collection/handling, submission of specimen other than nasopharyngeal swab, presence of viral mutation(s) within the areas targeted by this assay, and inadequate number of viral copies(<138 copies/mL). A negative result must be combined with clinical observations, patient history, and epidemiological information. The expected result is Negative.  Fact Sheet for Patients:  BloggerCourse.com  Fact Sheet for Healthcare Providers:  SeriousBroker.it  This test is no t yet approved or cleared by the [place] FDA and  has been authorized for detection and/or diagnosis of SARS-CoV-2 by FDA under an Emergency Use Authorization (EUA). This EUA will remain  in effect (meaning this test can be used) for the duration of the COVID-19 declaration under Section 564(b)(1) of the Act, 21 U.S.C.section 360bbb-3(b)(1), unless the authorization is terminated  or revoked sooner.       Influenza A by PCR NEGATIVE NEGATIVE   Influenza B by PCR NEGATIVE NEGATIVE    Comment: (NOTE) The Xpert Xpress SARS-CoV-2/FLU/RSV plus assay is intended as an aid in the diagnosis of influenza from Nasopharyngeal swab specimens and should not be used as a sole basis for treatment. Nasal washings and aspirates are unacceptable for Xpert Xpress SARS-CoV-2/FLU/RSV testing.  Fact Sheet for  Patients: BloggerCourse.com  Fact Sheet for Healthcare Providers: SeriousBroker.it  This test is not yet approved or cleared by the [place] FDA and has been authorized for detection and/or diagnosis of SARS-CoV-2 by FDA under an Emergency Use Authorization (EUA). This EUA will remain in effect (meaning this test can be used) for the duration of the COVID-19 declaration under Section 564(b)(1) of the Act, 21 U.S.C. section 360bbb-3(b)(1), unless the authorization is terminated or revoked.  Performed at [facility], [address], [place], [place] [address]     Blood Alcohol level:  Lab Results  Component Value Date   [facility] <10 01/04/2021   ETH 137 (H) 09/05/2020    Metabolic Disorder Labs: Lab Results  Component Value Date   HGBA1C 7.4 (H) 08/26/2019  MPG 165.68 08/26/2019   MPG 136.98 12/13/2018   No results found for: PROLACTIN Lab Results  Component Value Date   CHOL 178 08/26/2019   TRIG 375 (H) 08/26/2019   HDL 23 (L) 08/26/2019   CHOLHDL 7.7 08/26/2019   VLDL 75 (H) 08/26/2019   LDLCALC 80 08/26/2019   LDLCALC 70 12/13/2018    Physical Findings: AIMS: Facial and Oral Movements Muscles of Facial Expression: None, normal, ,  ,  ,    CIWA:    COWS:     Musculoskeletal: Strength & Muscle Tone: within normal limits Gait & Station: normal Patient leans: N/A  Psychiatric Specialty Exam: Physical Exam  Review of Systems  Blood pressure 123/81, pulse (!) 102, temperature 97.8 F (36.6 C), temperature source Oral, resp. rate 16, height 6' (1.829 m), weight 131.5 kg, SpO2 91 %.Body mass index is 39.33 kg/m.  General Appearance: Casual  Eye Contact:  Good  Speech:  Clear and Coherent and Normal Rate  Volume:  Normal  Mood:  Depressed  Affect:  Congruent and Constricted  Thought Process:  Coherent, Goal Directed and Linear  Orientation:  Full (Time, Place, and Person)  Thought Content:   Logical  Suicidal Thoughts:  No  Homicidal Thoughts:  No  Memory:  Immediate;   Fair Recent;   Fair Remote;   Fair  Judgement:  Fair  Insight:  Fair  Psychomotor Activity:  Normal  Concentration:  Concentration: Fair and Attention Span: Fair  Recall:  [employer] of Knowledge:  Fair  Language:  Fair  Akathisia:  No  Handed:  Right  AIMS (if indicated):     Assets:  Communication Skills Desire for Improvement Resilience Social Support  ADL's:  Intact  Cognition:  WNL  Sleep:  Number of Hours: 6.45     Treatment Plan Summary: Daily contact with patient to assess and evaluate symptoms and progress in treatment and Medication management   Patient is a 60 year old male with the above-stated past psychiatric and medical history who was admitted to [facility] unit secondary to worsened depression and suicidal ideations with a plan.  Today continues to reports feeling depressed and anxious due to unstable housing situation; denies suicidal thoughts, plans. Sleep improved. Patient is hopeful and oriented for future. The dose of Buspirone was increased yesterday - tolerates well. No med changes today.   Plan:  -continue inpatient psych admission; 15-minute checks; daily contact with patient to assess and evaluate symptoms and progress in treatment; psychoeducation.  -continue scheduled medications: . aspirin EC  81 mg Oral Daily  . atorvastatin  40 mg Oral Daily  . busPIRone  20 mg Oral TID  . escitalopram  20 mg Oral Daily  . hydrochlorothiazide  25 mg Oral Daily  . lisinopril  40 mg Oral Daily  . metFORMIN  500 mg Oral Q breakfast  . mometasone-formoterol  2 puff Inhalation BID  . nicotine  21 mg Transdermal Daily  . pantoprazole  40 mg Oral Daily  . QUEtiapine  400 mg Oral QHS  . zolpidem  10 mg Oral QHS    -continue PRN medications.  albuterol, alum & mag hydroxide-simeth, clonazePAM, hydrOXYzine, ibuprofen, magnesium hydroxide  -Pertinent Labs: no new labs ordered today     -Consults: No new consults placed since yesterday    -Disposition: Estimated duration of hospitalization: midweek next week. Social worker to help patient get into housing versus rehab versus shelter. All necessary aftercare will be arranged prior to discharge Likely d/c home with outpatient  psych follow-up.  -  I certify that the patient does need, on a daily basis, active treatment furnished directly by or requiring the supervision of inpatient psychiatric facility personnel.   Larita Fife, MD 01/06/2021, 9:11 AM

## 2021-01-07 LAB — GLUCOSE, CAPILLARY
Glucose-Capillary: 187 mg/dL — ABNORMAL HIGH (ref 70–99)
Glucose-Capillary: 178 mg/dL — ABNORMAL HIGH (ref 70–99)
Glucose-Capillary: 157 mg/dL — ABNORMAL HIGH (ref 70–99)
Glucose-Capillary: 200 mg/dL — ABNORMAL HIGH (ref 70–99)

## 2021-01-07 NOTE — [facility] Group Notes (Signed)
Group was not held due to inclement weather.  CSW team worked from home.  Mickal Meno, MSW, LCSW 01/07/2021 4:52 PM  

## 2021-01-07 NOTE — Progress Notes (Signed)
[facility] MD Progress Note  01/07/2021 2:50 PM [name]  MRN:  [id number]  Principal Problem: Severe recurrent major depression without psychotic features [facility]) Diagnosis: Principal Problem:   Severe recurrent major depression without psychotic features ([place]) Active Problems:   HTN (hypertension)   Tobacco use disorder   Generalized anxiety disorder   Type 2 diabetes mellitus with hyperglycemia ([place])  Total Time spent with patient: 30 minutes  IanSimmonsis a 60y.o. male that has a previous psychiatric history ofdepressionwho presents to the [facility] unit for treatment of depression and suicidal ideations.  Interval History Patient was seen today for re-evaluation.  Nursing reports no events overnight. The patient has no issues with performing ADLs.  Patient has been medication compliant.    Subjective:  On assessment patient reports "feeling anxious." He is most anxious about his unclear living situation. He is hopeful that someone can help board up the floor and shut the water off to his bathroom so he can still stay in his trailer at discharge. He is also anxious due to his elevated blood glucose levels. He notes he has never been on insulin before, and worries about his house. He notes he does not have a refrigerator or anywhere to store insulin at discharge, and hopes there are oral agents to assist. Previously he was just on metformin. He denies any suicidal ideations, homicidal ideations, visual hallucinations, or auditory hallucinations today. No side effects from current medications.  Labs: no new results for review.  Past Psychiatric History: Long history of anxiety and depression and mood swings. Back when he was drinking heavily he would have more extreme and erratic behavior but even since he has gotten sober for an extended period his depression and anxiety with limited coping skills have continued. Positive past suicide attempts.  Past Medical History:  Past Medical  History:  Diagnosis Date  . Anxiety   . Barrett esophagus   . Cancer ([place])   . Coronary artery disease   . Depression   . GERD (gastroesophageal reflux disease)   . Hypertension   . Pre-diabetes   . Stroke [facility] 2014   "mini-stroke" per patient    Past Surgical History:  Procedure Laterality Date  . ANGIOPLASTY    . APPENDECTOMY    . CARDIAC CATHETERIZATION    . CHOLECYSTECTOMY    . WRIST SURGERY Right    age 50   Family History:  Family History  Problem Relation Age of Onset  . Stroke Father   . Leukemia Father   . Breast cancer Sister    Family Psychiatric  History: unknown Social History:  Social History   Substance and Sexual Activity  Alcohol Use Not Currently     Social History   Substance and Sexual Activity  Drug Use Yes  . Types: Cocaine, Marijuana    Social History   Socioeconomic History  . Marital status: Divorced    Spouse name: Not on file  . Number of children: Not on file  . Years of education: Not on file  . Highest education level: Not on file  Occupational History  . Not on file  Tobacco Use  . Smoking status: Current Every Day Smoker    Packs/day: 1.00    Types: Cigarettes  . Smokeless tobacco: Never Used  Vaping Use  . Vaping Use: Never used  Substance and Sexual Activity  . Alcohol use: Not Currently  . Drug use: Yes    Types: Cocaine, Marijuana  . Sexual activity: Not on file  Other Topics Concern  . Not on file  Social History Narrative  . Not on file   Social Determinants of Health   Financial Resource Strain: Not on file  Food Insecurity: Not on file  Transportation Needs: Not on file  Physical Activity: Not on file  Stress: Not on file  Social Connections: Not on file   Additional Social History:                         Sleep: Good  Appetite:  Good  Current Medications: Current Facility-Administered Medications  Medication Dose Route Frequency Provider Last Rate Last Admin  . albuterol  (VENTOLIN HFA) 108 (90 Base) MCG/ACT inhaler 2 puff  2 puff Inhalation Q4H PRN Deloria Lair, NP   2 puff at 01/06/21 1724  . alum & mag hydroxide-simeth (MAALOX/MYLANTA) 200-200-20 MG/5ML suspension 30 mL  30 mL Oral Q4H PRN Dixon, Rashaun M, NP      . aspirin EC tablet 81 mg  81 mg Oral Daily Dixon, Rashaun M, NP   81 mg at 01/07/21 0817  . atorvastatin (LIPITOR) tablet 40 mg  40 mg Oral Daily Dixon, Rashaun M, NP   40 mg at 01/07/21 0817  . busPIRone (BUSPAR) tablet 20 mg  20 mg Oral TID Larita Fife, MD   20 mg at 01/07/21 1149  . clonazePAM (KLONOPIN) tablet 0.5 mg  0.5 mg Oral BID PRN Doren Custard, Rashaun M, NP   0.5 mg at 01/07/21 1415  . escitalopram (LEXAPRO) tablet 20 mg  20 mg Oral Daily Dixon, Rashaun M, NP   20 mg at 01/07/21 0817  . hydrochlorothiazide (HYDRODIURIL) tablet 25 mg  25 mg Oral Daily Dixon, Rashaun M, NP   25 mg at 01/07/21 0817  . hydrOXYzine (ATARAX/VISTARIL) tablet 25 mg  25 mg Oral TID PRN Deloria Lair, NP   25 mg at 01/05/21 2113  . ibuprofen (ADVIL) tablet 400 mg  400 mg Oral Q4H PRN Larita Fife, MD   400 mg at 01/07/21 1149  . insulin aspart (novoLOG) injection 0-24 Units  0-24 Units Subcutaneous Q4H Larita Fife, MD   4 Units at 01/07/21 1148  . lisinopril (ZESTRIL) tablet 40 mg  40 mg Oral Daily Dixon, Rashaun M, NP   40 mg at 01/07/21 0817  . magnesium hydroxide (MILK OF MAGNESIA) suspension 30 mL  30 mL Oral Daily PRN Deloria Lair, NP      . metFORMIN (GLUCOPHAGE-XR) 24 hr tablet 500 mg  500 mg Oral Q breakfast Dixon, Rashaun M, NP   500 mg at 01/07/21 0817  . mometasone-formoterol (DULERA) 200-5 MCG/ACT inhaler 2 puff  2 puff Inhalation BID Dixon, Rashaun M, NP   2 puff at 01/07/21 0816  . nicotine (NICODERM CQ - dosed in mg/24 hours) patch 21 mg  21 mg Transdermal Daily Larita Fife, MD   21 mg at 01/07/21 0817  . pantoprazole (PROTONIX) EC tablet 40 mg  40 mg Oral Daily Dixon, Rashaun M, NP   40 mg at 01/07/21 0817  . QUEtiapine (SEROQUEL XR) 24 hr  tablet 400 mg  400 mg Oral QHS Paliy, Delrae Rend, MD   400 mg at 01/06/21 2159  . zolpidem (AMBIEN) tablet 10 mg  10 mg Oral QHS Deloria Lair, NP   10 mg at 01/06/21 2159    Lab Results:  Results for orders placed or performed during the hospital encounter of 01/04/21 (from the past 48 hour(s))  Glucose, capillary  Status: Abnormal   Collection Time: 01/06/21 11:33 AM  Result Value Ref Range   Glucose-Capillary 243 (H) 70 - 99 mg/dL    Comment: Glucose reference range applies only to samples taken after fasting for at least 8 hours.  Glucose, capillary     Status: Abnormal   Collection Time: 01/06/21  4:06 PM  Result Value Ref Range   Glucose-Capillary 130 (H) 70 - 99 mg/dL    Comment: Glucose reference range applies only to samples taken after fasting for at least 8 hours.  Glucose, capillary     Status: Abnormal   Collection Time: 01/06/21  9:56 PM  Result Value Ref Range   Glucose-Capillary 236 (H) 70 - 99 mg/dL    Comment: Glucose reference range applies only to samples taken after fasting for at least 8 hours.  Glucose, capillary     Status: Abnormal   Collection Time: 01/07/21  7:16 AM  Result Value Ref Range   Glucose-Capillary 187 (H) 70 - 99 mg/dL    Comment: Glucose reference range applies only to samples taken after fasting for at least 8 hours.  Glucose, capillary     Status: Abnormal   Collection Time: 01/07/21 11:45 AM  Result Value Ref Range   Glucose-Capillary 178 (H) 70 - 99 mg/dL    Comment: Glucose reference range applies only to samples taken after fasting for at least 8 hours.    Blood Alcohol level:  Lab Results  Component Value Date   ETH <10 01/04/2021   ETH 137 (H) [id number]    Metabolic Disorder Labs: Lab Results  Component Value Date   HGBA1C 7.4 (H) 08/26/2019   MPG 165.68 08/26/2019   MPG 136.98 12/13/2018   No results found for: PROLACTIN Lab Results  Component Value Date   CHOL 178 08/26/2019   TRIG 375 (H) 08/26/2019   HDL 23 (L)  08/26/2019   CHOLHDL 7.7 08/26/2019   VLDL 75 (H) 08/26/2019   LDLCALC 80 08/26/2019   LDLCALC 70 12/13/2018    Physical Findings: AIMS: Facial and Oral Movements Muscles of Facial Expression: None, normal, ,  ,  ,    CIWA:    COWS:     Musculoskeletal: Strength & Muscle Tone: within normal limits Gait & Station: normal Patient leans: N/A  Psychiatric Specialty Exam: Physical Exam Vitals and nursing note reviewed.  Constitutional:      Appearance: Normal appearance.  HENT:     Head: Normocephalic and atraumatic.     Right Ear: External ear normal.     Left Ear: External ear normal.     Nose: Nose normal.     Mouth/Throat:     Mouth: Mucous membranes are moist.     Pharynx: Oropharynx is clear.  Eyes:     Extraocular Movements: Extraocular movements intact.     Conjunctiva/sclera: Conjunctivae normal.     Pupils: Pupils are equal, round, and reactive to light.  Cardiovascular:     Rate and Rhythm: Normal rate.     Pulses: Normal pulses.  Pulmonary:     Effort: Pulmonary effort is normal.     Breath sounds: Normal breath sounds.  Abdominal:     General: Abdomen is flat.     Palpations: Abdomen is soft.  Musculoskeletal:        General: No swelling. Normal range of motion.     Cervical back: Normal range of motion and neck supple.  Skin:    General: Skin is warm and dry.  Neurological:  General: No focal deficit present.     Mental Status: He is alert and oriented to person, place, and time.  Psychiatric:        Attention and Perception: Attention and perception normal.        Mood and Affect: Affect normal. Mood is anxious.        Speech: Speech normal.        Behavior: Behavior is cooperative.        Thought Content: Thought content does not include homicidal or suicidal ideation.        Cognition and Memory: Cognition and memory normal.        Judgment: Judgment normal.     Review of Systems  Constitutional: Negative for appetite change and fatigue.   HENT: Negative for rhinorrhea and sore throat.   Eyes: Negative for photophobia and visual disturbance.  Respiratory: Negative for cough and shortness of breath.   Cardiovascular: Negative for chest pain and palpitations.  Gastrointestinal: Negative for constipation, diarrhea, nausea and vomiting.  Endocrine: Negative for cold intolerance and heat intolerance.  Genitourinary: Negative for difficulty urinating and dysuria.  Musculoskeletal: Negative for arthralgias and myalgias.  Skin: Negative for rash and wound.  Allergic/Immunologic: Negative for food allergies and immunocompromised state.  Neurological: Negative for dizziness and headaches.  Hematological: Negative for adenopathy. Does not bruise/bleed easily.  Psychiatric/Behavioral: Positive for dysphoric mood. Negative for hallucinations and suicidal ideas. The patient is nervous/anxious.     Blood pressure 131/80, pulse 95, temperature (!) 97.4 F (36.3 C), temperature source Oral, resp. rate 17, height 6' (1.829 m), weight 131.5 kg, SpO2 91 %.Body mass index is 39.33 kg/m.  General Appearance: Casual  Eye Contact:  Good  Speech:  Clear and Coherent and Normal Rate  Volume:  Normal  Mood:  Depressed  Affect:  Congruent and Constricted  Thought Process:  Coherent, Goal Directed and Linear  Orientation:  Full (Time, Place, and Person)  Thought Content:  Logical  Suicidal Thoughts:  No  Homicidal Thoughts:  No  Memory:  Immediate;   Fair Recent;   Fair Remote;   Fair  Judgement:  Fair  Insight:  Fair  Psychomotor Activity:  Normal  Concentration:  Concentration: Fair and Attention Span: Fair  Recall:  [employer] of Knowledge:  Fair  Language:  Fair  Akathisia:  No  Handed:  Right  AIMS (if indicated):     Assets:  Communication Skills Desire for Improvement Resilience Social Support  ADL's:  Intact  Cognition:  WNL  Sleep:  Number of Hours: 7.5     Treatment Plan Summary: Daily contact with patient to assess  and evaluate symptoms and progress in treatment and Medication management   Patient is a 60 year old male with the above-stated past psychiatric and medical history who was admitted to [facility] unit secondary to worsened depression and suicidal ideations with a plan. Today continues to reports feeling depressed and anxious due to unstable housing situation; denies suicidal thoughts, plans. Sleep improved. Patient is hopeful and oriented for future. No med changes today.   Plan:  -continue inpatient psych admission; 15-minute checks; daily contact with patient to assess and evaluate symptoms and progress in treatment; psychoeducation.  -continue scheduled medications: . aspirin EC  81 mg Oral Daily  . atorvastatin  40 mg Oral Daily  . busPIRone  20 mg Oral TID  . escitalopram  20 mg Oral Daily  . hydrochlorothiazide  25 mg Oral Daily  . insulin aspart  0-24 Units Subcutaneous  Q4H  . lisinopril  40 mg Oral Daily  . metFORMIN  500 mg Oral Q breakfast  . mometasone-formoterol  2 puff Inhalation BID  . nicotine  21 mg Transdermal Daily  . pantoprazole  40 mg Oral Daily  . QUEtiapine  400 mg Oral QHS  . zolpidem  10 mg Oral QHS    -continue PRN medications.  albuterol, alum & mag hydroxide-simeth, clonazePAM, hydrOXYzine, ibuprofen, magnesium hydroxide  -Pertinent Labs: no new labs ordered today    -Consults: No new consults placed since yesterday    -Disposition: Estimated duration of hospitalization: mid to end of week. Social worker to help patient get into housing versus rehab versus shelter. All necessary aftercare will be arranged prior to discharge Likely d/c home with outpatient psych follow-up.  -  I certify that the patient does need, on a daily basis, active treatment furnished directly by or requiring the supervision of inpatient psychiatric facility personnel.   [name], MD 01/07/2021, 2:50 PM

## 2021-01-07 NOTE — Plan of Care (Signed)
Patient has been in bed and out for meals.  Continues to express housing concerns, reporting that his house is falling apart. And he is anxious about it. Patient denies SI/HI/AVH. Taking medications as scheduled eating well. Has no additional concerns.

## 2021-01-07 NOTE — Plan of Care (Signed)
  Problem: Education: Goal: Ability to make informed decisions regarding treatment will improve Outcome: Progressing   Problem: Coping: Goal: Coping ability will improve Outcome: Progressing   Problem: Health Behavior/Discharge Planning: Goal: Identification of resources available to assist in meeting health care needs will improve Outcome: Progressing   Problem: Medication: Goal: Compliance with prescribed medication regimen will improve Outcome: Progressing   Problem: Self-Concept: Goal: Ability to disclose and discuss suicidal ideas will improve Outcome: Progressing Goal: Will verbalize positive feelings about self Outcome: Progressing   Problem: Education: Goal: Knowledge of [place] [facility] Education information/materials will improve Outcome: Progressing Goal: Emotional status will improve Outcome: Progressing Goal: Mental status will improve Outcome: Progressing Goal: Verbalization of understanding the information provided will improve Outcome: Progressing   Problem: Activity: Goal: Interest or engagement in activities will improve Outcome: Progressing Goal: Sleeping patterns will improve Outcome: Progressing   Problem: Coping: Goal: Ability to verbalize frustrations and anger appropriately will improve Outcome: Progressing Goal: Ability to demonstrate self-control will improve Outcome: Progressing   Problem: Health Behavior/Discharge Planning: Goal: Identification of resources available to assist in meeting health care needs will improve Outcome: Progressing Goal: Compliance with treatment plan for underlying cause of condition will improve Outcome: Progressing   Problem: Physical Regulation: Goal: Ability to maintain clinical measurements within normal limits will improve Outcome: Progressing   Problem: Safety: Goal: Periods of time without injury will increase Outcome: Progressing   Problem: Education: Goal: Ability to make informed decisions  regarding treatment will improve Outcome: Progressing   Problem: Coping: Goal: Coping ability will improve Outcome: Progressing   Problem: Health Behavior/Discharge Planning: Goal: Identification of resources available to assist in meeting health care needs will improve Outcome: Progressing   Problem: Medication: Goal: Compliance with prescribed medication regimen will improve Outcome: Progressing   Problem: Self-Concept: Goal: Ability to disclose and discuss suicidal ideas will improve Outcome: Progressing Goal: Will verbalize positive feelings about self Outcome: Progressing

## 2021-01-08 LAB — GLUCOSE, CAPILLARY
Glucose-Capillary: 170 mg/dL — ABNORMAL HIGH (ref 70–99)
Glucose-Capillary: 147 mg/dL — ABNORMAL HIGH (ref 70–99)
Glucose-Capillary: 122 mg/dL — ABNORMAL HIGH (ref 70–99)
Glucose-Capillary: 182 mg/dL — ABNORMAL HIGH (ref 70–99)

## 2021-01-08 LAB — HEMOGLOBIN A1C
Hgb A1c MFr Bld: 7.4 % — ABNORMAL HIGH (ref 4.8–5.6)
Mean Plasma Glucose: 165.68 mg/dL

## 2021-01-08 MED ORDER — INSULIN ASPART 100 UNIT/ML ~~LOC~~ SOLN
0.0000 [IU] | Freq: Three times a day (TID) | SUBCUTANEOUS | Status: DC
  Administered 2021-01-08 (×2): 2 [IU] via SUBCUTANEOUS
  Administered 2021-01-08: 4 [IU] via SUBCUTANEOUS
  Administered 2021-01-09: 2 [IU] via SUBCUTANEOUS
  Administered 2021-01-09: 4 [IU] via SUBCUTANEOUS
  Administered 2021-01-09 (×2): 2 [IU] via SUBCUTANEOUS
  Administered 2021-01-10: 4 [IU] via SUBCUTANEOUS
  Administered 2021-01-10 (×2): 2 [IU] via SUBCUTANEOUS
  Administered 2021-01-11: 4 [IU] via SUBCUTANEOUS
  Administered 2021-01-11 – 2021-01-13 (×7): 2 [IU] via SUBCUTANEOUS
  Filled 2021-01-08 (×15): qty 1

## 2021-01-08 MED ORDER — METFORMIN HCL ER 500 MG PO TB24
1000.0000 mg | ORAL_TABLET | Freq: Every day | ORAL | Status: DC
  Administered 2021-01-09: 1000 mg via ORAL
  Filled 2021-01-08: qty 2

## 2021-01-08 NOTE — Progress Notes (Signed)
Patient is pleasant and cooperative this evening.  He has been active on the unit this evening watching football in the dayroom with other peers.  Denies SI  HI AVH and pain at this encounter.  Continues to endorse depression and anxiety in regards to his housing and fear that his house will be condemned. He is med compliant this evening medication and tolerated his medication without incident. Patient request not to be awaken for Q 4hr CBG testing tonight during sleep hours. He is safe on the unit with 15 minute safety checks and encouraged to come to staff with any concerns.    Cleo Butler-Nicholson, LPN

## 2021-01-08 NOTE — Plan of Care (Signed)
  Problem: Education: Goal: Ability to make informed decisions regarding treatment will improve Outcome: Completed/Met   Problem: Coping: Goal: Coping ability will improve Outcome: Progressing   Problem: Medication: Goal: Compliance with prescribed medication regimen will improve Outcome: Progressing   Problem: Self-Concept: Goal: Will verbalize positive feelings about self Outcome: Progressing   Problem: Coping: Goal: Coping ability will improve Outcome: Progressing   Problem: Education: Goal: Emotional status will improve Outcome: Progressing

## 2021-01-08 NOTE — Progress Notes (Signed)
[facility] ([place]) MD Progress Note  01/08/2021 2:27 PM Ian [name]  MRN:  [id number]  Principal Problem: Severe recurrent major depression without psychotic features [facility]) Diagnosis: Principal Problem:   Severe recurrent major depression without psychotic features ([place]) Active Problems:   HTN (hypertension)   Tobacco use disorder   Generalized anxiety disorder   Type 2 diabetes mellitus with hyperglycemia ([place])  Total Time spent with patient: 30 minutes  IanSimmonsis a 59y.o. male that has a previous psychiatric history ofdepressionwho presents to the [facility] unit for treatment of depression and suicidal ideations.  Interval History Patient was seen today for re-evaluation.  Nursing reports no events overnight. The patient has no issues with performing ADLs.  Patient has been medication compliant.    Subjective:  On assessment patient reports "feeling anxious." He is most anxious about his unclear living situation. He states he believes a friend will be able to help get his floor repaired tomorrow. He hopes to be able to return home this week. He is also anxious due to his elevated blood glucose levels. He notes he has never been on insulin before. He notes he does not have a refrigerator or anywhere to store insulin at discharge, and hopes there are oral agents to assist. Previously he was just on metformin, and is agreeable to plan to increase metformin today. He denies any suicidal ideations, homicidal ideations, visual hallucinations, or auditory hallucinations today. No side effects from current medications.  Labs: no new results for review.  Past Psychiatric History: Long history of anxiety and depression and mood swings. Back when he was drinking heavily he would have more extreme and erratic behavior but even since he has gotten sober for an extended period his depression and anxiety with limited coping skills have continued. Positive past suicide attempts.  Past Medical History:  Past  Medical History:  Diagnosis Date  . Anxiety   . Barrett esophagus   . Cancer ([place])   . Coronary artery disease   . Depression   . GERD (gastroesophageal reflux disease)   . Hypertension   . Pre-diabetes   . Stroke [facility]) 2014   "mini-stroke" per patient    Past Surgical History:  Procedure Laterality Date  . ANGIOPLASTY    . APPENDECTOMY    . CARDIAC CATHETERIZATION    . CHOLECYSTECTOMY    . WRIST SURGERY Right    age 68   Family History:  Family History  Problem Relation Age of Onset  . Stroke Father   . Leukemia Father   . Breast cancer Sister    Family Psychiatric  History: unknown Social History:  Social History   Substance and Sexual Activity  Alcohol Use Not Currently     Social History   Substance and Sexual Activity  Drug Use Yes  . Types: Cocaine, Marijuana    Social History   Socioeconomic History  . Marital status: Divorced    Spouse name: Not on file  . Number of children: Not on file  . Years of education: Not on file  . Highest education level: Not on file  Occupational History  . Not on file  Tobacco Use  . Smoking status: Current Every Day Smoker    Packs/day: 1.00    Types: Cigarettes  . Smokeless tobacco: Never Used  Vaping Use  . Vaping Use: Never used  Substance and Sexual Activity  . Alcohol use: Not Currently  . Drug use: Yes    Types: Cocaine, Marijuana  . Sexual activity: Not on  file  Other Topics Concern  . Not on file  Social History Narrative  . Not on file   Social Determinants of Health   Financial Resource Strain: Not on file  Food Insecurity: Not on file  Transportation Needs: Not on file  Physical Activity: Not on file  Stress: Not on file  Social Connections: Not on file   Additional Social History:                         Sleep: Good  Appetite:  Good  Current Medications: Current Facility-Administered Medications  Medication Dose Route Frequency Provider Last Rate Last Admin  . albuterol  (VENTOLIN HFA) 108 (90 Base) MCG/ACT inhaler 2 puff  2 puff Inhalation Q4H PRN Deloria Lair, NP   2 puff at 01/06/21 1724  . alum & mag hydroxide-simeth (MAALOX/MYLANTA) 200-200-20 MG/5ML suspension 30 mL  30 mL Oral Q4H PRN Dixon, Rashaun M, NP      . aspirin EC tablet 81 mg  81 mg Oral Daily Dixon, Rashaun M, NP   81 mg at 01/08/21 0811  . atorvastatin (LIPITOR) tablet 40 mg  40 mg Oral Daily Dixon, Rashaun M, NP   40 mg at 01/08/21 0810  . busPIRone (BUSPAR) tablet 20 mg  20 mg Oral TID Larita Fife, MD   20 mg at 01/08/21 1246  . clonazePAM (KLONOPIN) tablet 0.5 mg  0.5 mg Oral BID PRN Doren Custard, Rashaun M, NP   0.5 mg at 01/07/21 1415  . escitalopram (LEXAPRO) tablet 20 mg  20 mg Oral Daily Dixon, Rashaun M, NP   20 mg at 01/08/21 0811  . hydrochlorothiazide (HYDRODIURIL) tablet 25 mg  25 mg Oral Daily Dixon, Rashaun M, NP   25 mg at 01/08/21 [id number]  . hydrOXYzine (ATARAX/VISTARIL) tablet 25 mg  25 mg Oral TID PRN Deloria Lair, NP   25 mg at 01/05/21 2113  . ibuprofen (ADVIL) tablet 400 mg  400 mg Oral Q4H PRN Larita Fife, MD   400 mg at 01/08/21 1246  . insulin aspart (novoLOG) injection 0-24 Units  0-24 Units Subcutaneous TID AC & HS [name], MD   2 Units at 01/08/21 1244  . lisinopril (ZESTRIL) tablet 40 mg  40 mg Oral Daily Dixon, Rashaun M, NP   40 mg at 01/08/21 0811  . magnesium hydroxide (MILK OF MAGNESIA) suspension 30 mL  30 mL Oral Daily PRN Deloria Lair, NP      . [name] ON 01/09/2021] metFORMIN (GLUCOPHAGE-XR) 24 hr tablet 1,000 mg  1,000 mg Oral Q breakfast [name], MD      . mometasone-formoterol [facility]) 200-5 MCG/ACT inhaler 2 puff  2 puff Inhalation BID Anette Riedel M, NP   2 puff at 01/08/21 0812  . nicotine (NICODERM CQ - dosed in mg/24 hours) patch 21 mg  21 mg Transdermal Daily Larita Fife, MD   21 mg at 01/07/21 0817  . pantoprazole (PROTONIX) EC tablet 40 mg  40 mg Oral Daily Dixon, Rashaun M, NP   40 mg at 01/08/21 0811  . QUEtiapine (SEROQUEL  XR) 24 hr tablet 400 mg  400 mg Oral QHS Paliy, Delrae Rend, MD   400 mg at 01/07/21 2119  . zolpidem (AMBIEN) tablet 10 mg  10 mg Oral QHS Deloria Lair, NP   10 mg at 01/07/21 2119    Lab Results:  Results for orders placed or performed during the hospital encounter of 01/04/21 (from the  past 48 hour(s))  Glucose, capillary     Status: Abnormal   Collection Time: 01/06/21  4:06 PM  Result Value Ref Range   Glucose-Capillary 130 (H) 70 - 99 mg/dL    Comment: Glucose reference range applies only to samples taken after fasting for at least 8 hours.  Glucose, capillary     Status: Abnormal   Collection Time: 01/06/21  9:56 PM  Result Value Ref Range   Glucose-Capillary 236 (H) 70 - 99 mg/dL    Comment: Glucose reference range applies only to samples taken after fasting for at least 8 hours.  Glucose, capillary     Status: Abnormal   Collection Time: 01/07/21  7:16 AM  Result Value Ref Range   Glucose-Capillary 187 (H) 70 - 99 mg/dL    Comment: Glucose reference range applies only to samples taken after fasting for at least 8 hours.  Glucose, capillary     Status: Abnormal   Collection Time: 01/07/21 11:45 AM  Result Value Ref Range   Glucose-Capillary 178 (H) 70 - 99 mg/dL    Comment: Glucose reference range applies only to samples taken after fasting for at least 8 hours.  Glucose, capillary     Status: Abnormal   Collection Time: 01/07/21  3:55 PM  Result Value Ref Range   Glucose-Capillary 157 (H) 70 - 99 mg/dL    Comment: Glucose reference range applies only to samples taken after fasting for at least 8 hours.  Glucose, capillary     Status: Abnormal   Collection Time: 01/07/21  9:13 PM  Result Value Ref Range   Glucose-Capillary 200 (H) 70 - 99 mg/dL    Comment: Glucose reference range applies only to samples taken after fasting for at least 8 hours.  Glucose, capillary     Status: Abnormal   Collection Time: 01/08/21  6:30 AM  Result Value Ref Range   Glucose-Capillary 170 (H)  70 - 99 mg/dL    Comment: Glucose reference range applies only to samples taken after fasting for at least 8 hours.  Glucose, capillary     Status: Abnormal   Collection Time: 01/08/21 11:35 AM  Result Value Ref Range   Glucose-Capillary 147 (H) 70 - 99 mg/dL    Comment: Glucose reference range applies only to samples taken after fasting for at least 8 hours.    Blood Alcohol level:  Lab Results  Component Value Date   ETH <10 01/04/2021   ETH 137 (H) [id number]    Metabolic Disorder Labs: Lab Results  Component Value Date   HGBA1C 7.4 (H) 08/26/2019   MPG 165.68 08/26/2019   MPG 136.98 12/13/2018   No results found for: PROLACTIN Lab Results  Component Value Date   CHOL 178 08/26/2019   TRIG 375 (H) 08/26/2019   HDL 23 (L) 08/26/2019   CHOLHDL 7.7 08/26/2019   VLDL 75 (H) 08/26/2019   LDLCALC 80 08/26/2019   LDLCALC 70 12/13/2018    Physical Findings: AIMS: Facial and Oral Movements Muscles of Facial Expression: None, normal Lips and Perioral Area: None, normal Jaw: None, normal Tongue: None, normal,Extremity Movements Upper (arms, wrists, hands, fingers): None, normal Lower (legs, knees, ankles, toes): None, normal, Trunk Movements Neck, shoulders, hips: None, normal, Overall Severity Severity of abnormal movements (highest score from questions above): None, normal Incapacitation due to abnormal movements: None, normal Patient's awareness of abnormal movements (rate only patient's report): No Awareness, Dental Status Current problems with teeth and/or dentures?: No Does patient usually wear dentures?:  No  CIWA:    COWS:     Musculoskeletal: Strength & Muscle Tone: within normal limits Gait & Station: normal Patient leans: N/A  Psychiatric Specialty Exam: Physical Exam Vitals and nursing note reviewed.  Constitutional:      Appearance: Normal appearance.  HENT:     Head: Normocephalic and atraumatic.     Right Ear: External ear normal.     Left Ear:  External ear normal.     Nose: Nose normal.     Mouth/Throat:     Mouth: Mucous membranes are moist.     Pharynx: Oropharynx is clear.  Eyes:     Extraocular Movements: Extraocular movements intact.     Conjunctiva/sclera: Conjunctivae normal.     Pupils: Pupils are equal, round, and reactive to light.  Cardiovascular:     Rate and Rhythm: Normal rate.     Pulses: Normal pulses.  Pulmonary:     Effort: Pulmonary effort is normal.     Breath sounds: Normal breath sounds.  Abdominal:     General: Abdomen is flat.     Palpations: Abdomen is soft.  Musculoskeletal:        General: No swelling. Normal range of motion.     Cervical back: Normal range of motion and neck supple.  Skin:    General: Skin is warm and dry.  Neurological:     General: No focal deficit present.     Mental Status: He is alert and oriented to person, place, and time.  Psychiatric:        Attention and Perception: Attention and perception normal.        Mood and Affect: Affect normal. Mood is anxious.        Speech: Speech normal.        Behavior: Behavior is cooperative.        Thought Content: Thought content does not include homicidal or suicidal ideation.        Cognition and Memory: Cognition and memory normal.        Judgment: Judgment normal.     Review of Systems  Constitutional: Negative for appetite change and fatigue.  HENT: Negative for rhinorrhea and sore throat.   Eyes: Negative for photophobia and visual disturbance.  Respiratory: Negative for cough and shortness of breath.   Cardiovascular: Negative for chest pain and palpitations.  Gastrointestinal: Negative for constipation, diarrhea, nausea and vomiting.  Endocrine: Negative for cold intolerance and heat intolerance.  Genitourinary: Negative for difficulty urinating and dysuria.  Musculoskeletal: Negative for arthralgias and myalgias.  Skin: Negative for rash and wound.  Allergic/Immunologic: Negative for food allergies and  immunocompromised state.  Neurological: Negative for dizziness and headaches.  Hematological: Negative for adenopathy. Does not bruise/bleed easily.  Psychiatric/Behavioral: Positive for dysphoric mood. Negative for hallucinations and suicidal ideas. The patient is nervous/anxious.     Blood pressure 126/85, pulse 93, temperature (!) 97.4 F (36.3 C), temperature source Oral, resp. rate 14, height 6' (1.829 m), weight 131.5 kg, SpO2 95 %.Body mass index is 39.33 kg/m.  General Appearance: Casual  Eye Contact:  Good  Speech:  Clear and Coherent and Normal Rate  Volume:  Normal  Mood:  Depressed  Affect:  Congruent and Constricted  Thought Process:  Coherent, Goal Directed and Linear  Orientation:  Full (Time, Place, and Person)  Thought Content:  Logical  Suicidal Thoughts:  No  Homicidal Thoughts:  No  Memory:  Immediate;   Fair Recent;   Fair Remote;   Fair  Judgement:  Fair  Insight:  Fair  Psychomotor Activity:  Normal  Concentration:  Concentration: Fair and Attention Span: Fair  Recall:  [employer] of Knowledge:  Fair  Language:  Fair  Akathisia:  No  Handed:  Right  AIMS (if indicated):     Assets:  Communication Skills Desire for Improvement Resilience Social Support  ADL's:  Intact  Cognition:  WNL  Sleep:  Number of Hours: 7.25     Treatment Plan Summary: Daily contact with patient to assess and evaluate symptoms and progress in treatment and Medication management   Patient is a 60 year old male with the above-stated past psychiatric and medical history who was admitted to [facility] unit secondary to worsened depression and suicidal ideations with a plan. Today continues to reports feeling depressed and anxious due to unstable housing situation; denies suicidal thoughts, plans. Sleep improved. Patient is hopeful and oriented for future. No med changes today.   Plan:  -continue inpatient psych admission; 15-minute checks; daily contact with patient to assess and  evaluate symptoms and progress in treatment; psychoeducation.  -continue scheduled medications: . aspirin EC  81 mg Oral Daily  . atorvastatin  40 mg Oral Daily  . busPIRone  20 mg Oral TID  . escitalopram  20 mg Oral Daily  . hydrochlorothiazide  25 mg Oral Daily  . insulin aspart  0-24 Units Subcutaneous TID AC & HS  . lisinopril  40 mg Oral Daily  . [START ON 01/09/2021] metFORMIN  1,000 mg Oral Q breakfast  . mometasone-formoterol  2 puff Inhalation BID  . nicotine  21 mg Transdermal Daily  . pantoprazole  40 mg Oral Daily  . QUEtiapine  400 mg Oral QHS  . zolpidem  10 mg Oral QHS    -continue PRN medications.  albuterol, alum & mag hydroxide-simeth, clonazePAM, hydrOXYzine, ibuprofen, magnesium hydroxide  -Pertinent Labs: no new labs ordered today    -Consults: No new consults placed since yesterday    -Disposition: Estimated duration of hospitalization: mid to end of week. Social worker to help patient get into housing versus rehab versus shelter. All necessary aftercare will be arranged prior to discharge Likely d/c home with outpatient psych follow-up.  -  I certify that the patient does need, on a daily basis, active treatment furnished directly by or requiring the supervision of inpatient psychiatric facility personnel.   [name], MD 01/08/2021, 2:27 PM

## 2021-01-08 NOTE — Progress Notes (Signed)
Pt visible in milieu majority of this shift. Attended scheduled groups and was engaged in activities. Remains medication compliant and denies adverse drug reactions or discomfort at this time. Reports he slept well last night with good appetite. Rates his depression 8/10, anxiety and back pain 7/10. Reports current stressor is returning to his home or finding somewhere else to stay "My house is not good right now, it will be condemned by the city". Pt denies SI, HI and AVH at this time. Received PRN Advil 400 mg PO for complain of back pain. Emotional support and encouragement offered. Safety checks maintained at Q 15 minutes intervals. Scheduled and PRN medications given as ordered with verbal education. Writer encouraged pt to voice concerns.  Pt reports his back pain 2/10. Tolerates all meals well. Cooperative with care. Remains safe on unit.

## 2021-01-08 NOTE — Progress Notes (Signed)
Inpatient Diabetes Program Recommendations  AACE/ADA: New Consensus Statement on Inpatient Glycemic Control (2015)  Target Ranges:  Prepandial:   less than 140 mg/dL      Peak postprandial:   less than 180 mg/dL (1-2 hours)      Critically ill patients:  140 - 180 mg/dL   Lab Results  Component Value Date   GLUCAP 147 (H) 01/08/2021   HGBA1C 7.4 (H) 08/26/2019    Review of Glycemic Control Results for Ian [name], Ian [name] (MRN [id number]) as of 01/08/2021 11:37  Ref. Range 01/07/2021 11:45 01/07/2021 15:55 01/07/2021 21:13 01/08/2021 06:30 01/08/2021 11:35  Glucose-Capillary Latest Ref Range: 70 - 99 mg/dL 178 (H) 157 (H) 200 (H) 170 (H) 147 (H)   Diabetes history:  DM2 Outpatient Diabetes medications:  Metformin XR 500 mg qam Current orders for Inpatient glycemic control:  Metfromin XR 1000 mg daily Novolog 0-24 units TID  Inpatient Diabetes Program Recommendations:     Noted Inpatient Diabetes consult for requiring insulin inpatient.  Previously only on Metformin.  Please consider obtaining a current A1C.  Will continue to follow while inpatient.  Thank you, Reche Dixon, RN, BSN Diabetes Coordinator Inpatient Diabetes Program [phone] (team pager from 8a-5p)

## 2021-01-08 NOTE — Tx Team (Signed)
Interdisciplinary Treatment and Diagnostic Plan Update  01/08/2021 Time of Session: 9:00AM Ian [name] MRN: [id number]  Principal Diagnosis: Severe recurrent major depression without psychotic features [facility])  Secondary Diagnoses: Principal Problem:   Severe recurrent major depression without psychotic features ([place]) Active Problems:   HTN (hypertension)   Tobacco use disorder   Generalized anxiety disorder   Type 2 diabetes mellitus with hyperglycemia ([place])   Current Medications:  Current Facility-Administered Medications  Medication Dose Route Frequency Provider Last Rate Last Admin  . albuterol (VENTOLIN HFA) 108 (90 Base) MCG/ACT inhaler 2 puff  2 puff Inhalation Q4H PRN Deloria Lair, NP   2 puff at 01/06/21 1724  . alum & mag hydroxide-simeth (MAALOX/MYLANTA) 200-200-20 MG/5ML suspension 30 mL  30 mL Oral Q4H PRN Dixon, Rashaun M, NP      . aspirin EC tablet 81 mg  81 mg Oral Daily Dixon, Rashaun M, NP   81 mg at 01/08/21 0811  . atorvastatin (LIPITOR) tablet 40 mg  40 mg Oral Daily Dixon, Rashaun M, NP   40 mg at 01/08/21 0810  . busPIRone (BUSPAR) tablet 20 mg  20 mg Oral TID Larita Fife, MD   20 mg at 01/08/21 0811  . clonazePAM (KLONOPIN) tablet 0.5 mg  0.5 mg Oral BID PRN Doren Custard, Rashaun M, NP   0.5 mg at 01/07/21 1415  . escitalopram (LEXAPRO) tablet 20 mg  20 mg Oral Daily Dixon, Rashaun M, NP   20 mg at 01/08/21 0811  . hydrochlorothiazide (HYDRODIURIL) tablet 25 mg  25 mg Oral Daily Dixon, Rashaun M, NP   25 mg at 01/08/21 4098  . hydrOXYzine (ATARAX/VISTARIL) tablet 25 mg  25 mg Oral TID PRN Deloria Lair, NP   25 mg at 01/05/21 2113  . ibuprofen (ADVIL) tablet 400 mg  400 mg Oral Q4H PRN Larita Fife, MD   400 mg at 01/07/21 1149  . insulin aspart (novoLOG) injection 0-24 Units  0-24 Units Subcutaneous Q4H Larita Fife, MD   4 Units at 01/08/21 0817  . lisinopril (ZESTRIL) tablet 40 mg  40 mg Oral Daily Dixon, Rashaun M, NP   40 mg at 01/08/21 0811  .  magnesium hydroxide (MILK OF MAGNESIA) suspension 30 mL  30 mL Oral Daily PRN Deloria Lair, NP      . [name] ON 01/09/2021] metFORMIN (GLUCOPHAGE-XR) 24 hr tablet 1,000 mg  1,000 mg Oral Q breakfast [name], MD      . mometasone-formoterol [facility]) 200-5 MCG/ACT inhaler 2 puff  2 puff Inhalation BID Anette Riedel M, NP   2 puff at 01/08/21 0812  . nicotine (NICODERM CQ - dosed in mg/24 hours) patch 21 mg  21 mg Transdermal Daily Larita Fife, MD   21 mg at 01/07/21 0817  . pantoprazole (PROTONIX) EC tablet 40 mg  40 mg Oral Daily Dixon, Rashaun M, NP   40 mg at 01/08/21 0811  . QUEtiapine (SEROQUEL XR) 24 hr tablet 400 mg  400 mg Oral QHS Paliy, Delrae Rend, MD   400 mg at 01/07/21 2119  . zolpidem (AMBIEN) tablet 10 mg  10 mg Oral QHS Deloria Lair, NP   10 mg at 01/07/21 2119   PTA Medications: Medications Prior to Admission  Medication Sig Dispense Refill Last Dose  . ADVAIR DISKUS 500-50 MCG/DOSE AEPB Inhale 1 puff into the lungs 2 (two) times daily.     Marland Kitchen albuterol (VENTOLIN HFA) 108 (90 Base) MCG/ACT inhaler Inhale 2 puffs into the lungs every  6 (six) hours as needed for wheezing or shortness of breath. 18 g 1   . aspirin 81 MG chewable tablet Chew 1 tablet (81 mg total) by mouth daily. 30 tablet 1   . atorvastatin (LIPITOR) 40 MG tablet Take 1 tablet (40 mg total) by mouth daily. 30 tablet 1   . busPIRone (BUSPAR) 15 MG tablet Take 1 tablet (15 mg total) by mouth 3 (three) times daily. 90 tablet 1   . clonazePAM (KLONOPIN) 0.5 MG tablet Take 1 tablet (0.5 mg total) by mouth 2 (two) times daily as needed (Anxiety). 60 tablet 1   . escitalopram (LEXAPRO) 20 MG tablet Take 1 tablet (20 mg total) by mouth daily. 30 tablet 1   . hydrochlorothiazide (HYDRODIURIL) 25 MG tablet Take 1 tablet (25 mg total) by mouth daily. 30 tablet 1   . lisinopril (ZESTRIL) 40 MG tablet Take 1 tablet (40 mg total) by mouth daily. 30 tablet 1   . metFORMIN (GLUCOPHAGE-XR) 500 MG 24 hr tablet Take 1 tablet  (500 mg total) by mouth daily with breakfast. 30 tablet 1   . mometasone-formoterol (DULERA) 200-5 MCG/ACT AERO Inhale 2 puffs into the lungs 2 (two) times daily. 1 each 1   . pantoprazole (PROTONIX) 40 MG tablet Take 1 tablet (40 mg total) by mouth daily. 30 tablet 1   . QUEtiapine (SEROQUEL XR) 400 MG 24 hr tablet Take 1 tablet (400 mg total) by mouth at bedtime. 30 tablet 1   . zolpidem (AMBIEN) 10 MG tablet Take 1 tablet (10 mg total) by mouth at bedtime as needed for sleep. 30 tablet 1     Patient Stressors:    Patient Strengths:    Treatment Modalities: Medication Management, Group therapy, Case management,  1 to 1 session with clinician, Psychoeducation, Recreational therapy.   Physician Treatment Plan for Primary Diagnosis: Severe recurrent major depression without psychotic features ([place]) Long Term Goal(s): Improvement in symptoms so as ready for discharge Improvement in symptoms so as ready for discharge   Short Term Goals: Ability to identify changes in lifestyle to reduce recurrence of condition will improve Ability to verbalize feelings will improve Ability to disclose and discuss suicidal ideas Ability to demonstrate self-control will improve Ability to identify and develop effective coping behaviors will improve Ability to maintain clinical measurements within normal limits will improve Compliance with prescribed medications will improve Ability to identify triggers associated with substance abuse/mental health issues will improve Ability to identify changes in lifestyle to reduce recurrence of condition will improve Ability to verbalize feelings will improve Ability to disclose and discuss suicidal ideas Ability to demonstrate self-control will improve Ability to identify and develop effective coping behaviors will improve Ability to maintain clinical measurements within normal limits will improve Compliance with prescribed medications will improve Ability to  identify triggers associated with substance abuse/mental health issues will improve  Medication Management: Evaluate patient's response, side effects, and tolerance of medication regimen.  Therapeutic Interventions: 1 to 1 sessions, Unit Group sessions and Medication administration.  Evaluation of Outcomes: Not Met  Physician Treatment Plan for Secondary Diagnosis: Principal Problem:   Severe recurrent major depression without psychotic features ([place]) Active Problems:   HTN (hypertension)   Tobacco use disorder   Generalized anxiety disorder   Type 2 diabetes mellitus with hyperglycemia ([place])  Long Term Goal(s): Improvement in symptoms so as ready for discharge Improvement in symptoms so as ready for discharge   Short Term Goals: Ability to identify changes in lifestyle to reduce  recurrence of condition will improve Ability to verbalize feelings will improve Ability to disclose and discuss suicidal ideas Ability to demonstrate self-control will improve Ability to identify and develop effective coping behaviors will improve Ability to maintain clinical measurements within normal limits will improve Compliance with prescribed medications will improve Ability to identify triggers associated with substance abuse/mental health issues will improve Ability to identify changes in lifestyle to reduce recurrence of condition will improve Ability to verbalize feelings will improve Ability to disclose and discuss suicidal ideas Ability to demonstrate self-control will improve Ability to identify and develop effective coping behaviors will improve Ability to maintain clinical measurements within normal limits will improve Compliance with prescribed medications will improve Ability to identify triggers associated with substance abuse/mental health issues will improve     Medication Management: Evaluate patient's response, side effects, and tolerance of medication regimen.  Therapeutic  Interventions: 1 to 1 sessions, Unit Group sessions and Medication administration.  Evaluation of Outcomes: Not Met   RN Treatment Plan for Primary Diagnosis: Severe recurrent major depression without psychotic features ([place]) Long Term Goal(s): Knowledge of disease and therapeutic regimen to maintain health will improve  Short Term Goals: Ability to verbalize frustration and anger appropriately will improve, Ability to demonstrate self-control, Ability to participate in decision making will improve, Ability to identify and develop effective coping behaviors will improve and Compliance with prescribed medications will improve  Medication Management: RN will administer medications as ordered by provider, will assess and evaluate patient's response and provide education to patient for prescribed medication. RN will report any adverse and/or side effects to prescribing provider.  Therapeutic Interventions: 1 on 1 counseling sessions, Psychoeducation, Medication administration, Evaluate responses to treatment, Monitor vital signs and CBGs as ordered, Perform/monitor CIWA, COWS, AIMS and Fall Risk screenings as ordered, Perform wound care treatments as ordered.  Evaluation of Outcomes: Not Met   LCSW Treatment Plan for Primary Diagnosis: Severe recurrent major depression without psychotic features ([place]) Long Term Goal(s): Safe transition to appropriate next level of care at discharge, Engage patient in therapeutic group addressing interpersonal concerns.  Short Term Goals: Engage patient in aftercare planning with referrals and resources, Increase social support, Increase emotional regulation, Facilitate acceptance of mental health diagnosis and concerns, Identify triggers associated with mental health/substance abuse issues and Increase skills for wellness and recovery  Therapeutic Interventions: Assess for all discharge needs, 1 to 1 time with Social worker, Explore available resources and support  systems, Assess for adequacy in community support network, Educate family and significant other(s) on suicide prevention, Complete Psychosocial Assessment, Interpersonal group therapy.  Evaluation of Outcomes: Not Met   Progress in Treatment: Attending groups: No. Participating in groups: No. Taking medication as prescribed: Yes. Toleration medication: Yes. Family/Significant other contact made: No, will contact:  if given permission. Pt declined collateral/familial contact. Patient understands diagnosis: Yes. Discussing patient identified problems/goals with staff: Yes. Medical problems stabilized or resolved: Yes. Denies suicidal/homicidal ideation: Yes. Issues/concerns per patient self-inventory: No. Other: None.  New problem(s) identified: No, Describe:  none.  New Short Term/Long Term Goal(s): medication management for mood stabilization; elimination of SI thoughts; development of comprehensive mental wellness/sobriety plan.  Patient Goals: "To try to get out of here but make sure it is safe for me to do so."   Discharge Plan or Barriers: Patient plans to discharge back to his home. CSW will assist with discharge/aftercare planning as necessary.  Reason for Continuation of Hospitalization: Anxiety Suicidal ideation  Estimated Length of Stay: 1-7 days  Attendees: Patient: Ian [name] 01/08/2021 10:43 AM  Physician: Selina Cooley, MD 01/08/2021 10:43 AM  Nursing: Graceann Congress, RN 01/08/2021 10:43 AM  RN Care Manager: 01/08/2021 10:43 AM  Social Worker: Chalmers Guest. Guerry Bruin, MSW, [place], [place] 01/08/2021 10:43 AM  Recreational Therapist:  01/08/2021 10:43 AM  Other: Assunta Curtis, MSW, LCSW 01/08/2021 10:43 AM  Other:  01/08/2021 10:43 AM  Other: 01/08/2021 10:43 AM    Scribe for Treatment Team: Shirl Harris, LCSW 01/08/2021 10:43 AM

## 2021-01-08 NOTE — [facility] Group Notes (Signed)
LCSW Group Therapy Note  01/08/2021 2:14 PM  Type of Therapy/Topic:  Group Therapy:  Feelings about Diagnosis  Participation Level:  Active   Description of Group:   This group will allow patients to explore their thoughts and feelings about diagnoses they have received. Patients will be guided to explore their level of understanding and acceptance of these diagnoses. Facilitator will encourage patients to process their thoughts and feelings about the reactions of others to their diagnosis and will guide patients in identifying ways to discuss their diagnosis with significant others in their lives. This group will be process-oriented, with patients participating in exploration of their own experiences, giving and receiving support, and processing challenge from other group members.   Therapeutic Goals: 1. Patient will demonstrate understanding of diagnosis as evidenced by identifying two or more symptoms of the disorder 2. Patient will be able to express two feelings regarding the diagnosis 3. Patient will demonstrate their ability to communicate their needs through discussion and/or role play  Summary of Patient Progress: Patient was present in group.  Patient was an active participant.  Patient was able to discuss how the stigma attached to mental health has negatively impacted his life.  He shared how he has sought treatment, however, the recommendations have been group therapy and he prefers individual therapy.    Therapeutic Modalities:   Cognitive Behavioral Therapy Brief Therapy Feelings Identification   Assunta Curtis, MSW, LCSW 01/08/2021 2:14 PM

## 2021-01-09 LAB — GLUCOSE, CAPILLARY
Glucose-Capillary: 148 mg/dL — ABNORMAL HIGH (ref 70–99)
Glucose-Capillary: 189 mg/dL — ABNORMAL HIGH (ref 70–99)
Glucose-Capillary: 132 mg/dL — ABNORMAL HIGH (ref 70–99)
Glucose-Capillary: 120 mg/dL — ABNORMAL HIGH (ref 70–99)

## 2021-01-09 MED ORDER — METFORMIN HCL ER 500 MG PO TB24
1000.0000 mg | ORAL_TABLET | Freq: Two times a day (BID) | ORAL | Status: DC
  Administered 2021-01-09 – 2021-01-15 (×12): 1000 mg via ORAL
  Filled 2021-01-09 (×14): qty 2

## 2021-01-09 NOTE — [facility] Group Notes (Signed)
[place] Group Notes:  (Nursing/MHT/Case Management/Adjunct)  Date:  01/09/2021  Time:  10:15 PM  Type of Therapy:  Group Therapy  Participation Level:  Active  Participation Quality:  Appropriate  Affect:  Appropriate  Cognitive:  Alert  Insight:  Good  Engagement in Group:  Engaged and said got to talk to social worker today and other people to and had a good day.  Modes of Intervention:  Support  Summary of Progress/Problems:  Nehemiah Settle 01/09/2021, 10:15 PM

## 2021-01-09 NOTE — Plan of Care (Signed)
Patient is pleasant and cooperative on approach.Patient stated that he feels lot better after he talked to his friend about his trailer.Denies SI,HI and AVH.Compliant with medications.Visible in the milieu.Appropriate with staff & peers.Appetite and energy level good.Support and encouragement given.

## 2021-01-09 NOTE — Progress Notes (Signed)
Recreation Therapy Notes  INPATIENT RECREATION TR PLAN  Patient Details Name: Ian [name] MRN: [id number] DOB: 1961-05-19 Today's Date: 01/09/2021  Rec Therapy Plan Is patient appropriate for Therapeutic Recreation?: Yes Treatment times per week: at least 3 Estimated Length of Stay: 5-7 days TR Treatment/Interventions: Group participation (Comment)  Discharge Criteria Pt will be discharged from therapy if:: Discharged Treatment plan/goals/alternatives discussed and agreed upon by:: Patient/family  Discharge Summary     Ian [name] 01/09/2021, 11:31 AM

## 2021-01-09 NOTE — Progress Notes (Signed)
Inpatient Diabetes Program Recommendations  AACE/ADA: New Consensus Statement on Inpatient Glycemic Control (2015)  Target Ranges:  Prepandial:   less than 140 mg/dL      Peak postprandial:   less than 180 mg/dL (1-2 hours)      Critically ill patients:  140 - 180 mg/dL   Lab Results  Component Value Date   GLUCAP 148 (H) 01/09/2021   HGBA1C 7.4 (H) 01/04/2021    Diabetes history:  DM2 Outpatient Diabetes medications:  Metformin XR 500 mg qam Current orders for Inpatient glycemic control:  Metfromin XR 1000 mg daily Novolog 0-24 units TID  Inpatient Diabetes Program Recommendations:     Could consider further increasing Metformin 1000 mg to BID. A1C at goal.   Thanks, Bronson Curb, MSN, RNC-OB Diabetes Coordinator [phone] (8a-5p)

## 2021-01-09 NOTE — Progress Notes (Signed)
Recreation Therapy Notes  INPATIENT RECREATION THERAPY ASSESSMENT  Patient Details Name: Ian [name] MRN: [id number] DOB: [birth year] Today's Date: 01/09/2021       Information Obtained From: Patient  Able to Participate in Assessment/Interview: Yes  Patient Presentation: Responsive  Reason for Admission (Per Patient): Active Symptoms,Suicidal Ideation  Patient Stressors:    Coping Skills:   TV  Leisure Interests (2+):  Individual - TV  Frequency of Recreation/Participation: Weekly  Awareness of Community Resources:     [employer]:     Current Use:    If no, Barriers?:    Expressed Interest in [name] Information:    [place] of Residence:  Insurance underwriter  Patient Main Form of Transportation: Other (Comment) (Find a ride)  Patient Strengths:  N/A  Patient Identified Areas of Improvement:  Get my place fixed up  Patient Goal for Hospitalization:  Get myself together  Current SI (including self-harm):  No  Current HI:  No  Current AVH: No  Staff Intervention Plan: Group Attendance,Collaborate with Interdisciplinary Treatment Team  Consent to Intern Participation: N/A  Shaleen Talamantez 01/09/2021, 11:30 AM

## 2021-01-09 NOTE — Progress Notes (Signed)
[facility] MD Progress Note  01/09/2021 12:39 PM Ian [name]  MRN:  [id number]  Principal Problem: Severe recurrent major depression without psychotic features [facility]) Diagnosis: Principal Problem:   Severe recurrent major depression without psychotic features ([place]) Active Problems:   HTN (hypertension)   Tobacco use disorder   Generalized anxiety disorder   Type 2 diabetes mellitus with hyperglycemia ([place])  Total Time spent with patient: 30 minutes  [name] a 59y.o. male that has a previous psychiatric history ofdepressionwho presents to the [facility] unit for treatment of depression and suicidal ideations.  Interval History Patient was seen today for re-evaluation.  Nursing reports no events overnight. The patient has no issues with performing ADLs.  Patient has been medication compliant.    Subjective:  On assessment patient reports "feeling anxious, but better."  He notes he was able to speak to his friend, and they think they will be able to repair his trailer this weekend so that it can be made livable by Monday. He denies any suicidal ideations, homicidal ideations, visual hallucinations, or auditory hallucinations today. No side effects from current medications. Will increase metformin to 1000 mg BID per diabetic coordinator recommendations.   Labs: no new results for review.  Past Psychiatric History: Long history of anxiety and depression and mood swings. Back when he was drinking heavily he would have more extreme and erratic behavior but even since he has gotten sober for an extended period his depression and anxiety with limited coping skills have continued. Positive past suicide attempts.  Past Medical History:  Past Medical History:  Diagnosis Date  . Anxiety   . Barrett esophagus   . Cancer ([place])   . Coronary artery disease   . Depression   . GERD (gastroesophageal reflux disease)   . Hypertension   . Pre-diabetes   . Stroke [facility]) 2014   "mini-stroke" per  patient    Past Surgical History:  Procedure Laterality Date  . ANGIOPLASTY    . APPENDECTOMY    . CARDIAC CATHETERIZATION    . CHOLECYSTECTOMY    . WRIST SURGERY Right    age 38   Family History:  Family History  Problem Relation Age of Onset  . Stroke Father   . Leukemia Father   . Breast cancer Sister    Family Psychiatric  History: unknown Social History:  Social History   Substance and Sexual Activity  Alcohol Use Not Currently     Social History   Substance and Sexual Activity  Drug Use Yes  . Types: Cocaine, Marijuana    Social History   Socioeconomic History  . Marital status: Divorced    Spouse name: Not on file  . Number of children: Not on file  . Years of education: Not on file  . Highest education level: Not on file  Occupational History  . Not on file  Tobacco Use  . Smoking status: Current Every Day Smoker    Packs/day: 1.00    Types: Cigarettes  . Smokeless tobacco: Never Used  Vaping Use  . Vaping Use: Never used  Substance and Sexual Activity  . Alcohol use: Not Currently  . Drug use: Yes    Types: Cocaine, Marijuana  . Sexual activity: Not on file  Other Topics Concern  . Not on file  Social History Narrative  . Not on file   Social Determinants of Health   Financial Resource Strain: Not on file  Food Insecurity: Not on file  Transportation Needs: Not on file  Physical Activity: Not on file  Stress: Not on file  Social Connections: Not on file   Additional Social History:                         Sleep: Good  Appetite:  Good  Current Medications: Current Facility-Administered Medications  Medication Dose Route Frequency Provider Last Rate Last Admin  . albuterol (VENTOLIN HFA) 108 (90 Base) MCG/ACT inhaler 2 puff  2 puff Inhalation Q4H PRN Deloria Lair, NP   2 puff at 01/06/21 1724  . alum & mag hydroxide-simeth (MAALOX/MYLANTA) 200-200-20 MG/5ML suspension 30 mL  30 mL Oral Q4H PRN Dixon, Rashaun M, NP       . aspirin EC tablet 81 mg  81 mg Oral Daily Dixon, Rashaun M, NP   81 mg at 01/09/21 0756  . atorvastatin (LIPITOR) tablet 40 mg  40 mg Oral Daily Dixon, Rashaun M, NP   40 mg at 01/09/21 0757  . busPIRone (BUSPAR) tablet 20 mg  20 mg Oral TID Larita Fife, MD   20 mg at 01/09/21 1212  . clonazePAM (KLONOPIN) tablet 0.5 mg  0.5 mg Oral BID PRN Doren Custard, Rashaun M, NP   0.5 mg at 01/07/21 1415  . escitalopram (LEXAPRO) tablet 20 mg  20 mg Oral Daily Dixon, Rashaun M, NP   20 mg at 01/09/21 0757  . hydrochlorothiazide (HYDRODIURIL) tablet 25 mg  25 mg Oral Daily Dixon, Rashaun M, NP   25 mg at 01/09/21 0757  . hydrOXYzine (ATARAX/VISTARIL) tablet 25 mg  25 mg Oral TID PRN Deloria Lair, NP   25 mg at 01/05/21 2113  . ibuprofen (ADVIL) tablet 400 mg  400 mg Oral Q4H PRN Larita Fife, MD   400 mg at 01/09/21 0645  . insulin aspart (novoLOG) injection 0-24 Units  0-24 Units Subcutaneous TID AC & HS [name], MD   4 Units at 01/09/21 1212  . lisinopril (ZESTRIL) tablet 40 mg  40 mg Oral Daily Dixon, Rashaun M, NP   40 mg at 01/09/21 0757  . magnesium hydroxide (MILK OF MAGNESIA) suspension 30 mL  30 mL Oral Daily PRN Dixon, Rashaun M, NP      . metFORMIN (GLUCOPHAGE-XR) 24 hr tablet 1,000 mg  1,000 mg Oral BID WC [name], MD      . mometasone-formoterol [facility]) 200-5 MCG/ACT inhaler 2 puff  2 puff Inhalation BID Deloria Lair, NP   2 puff at 01/09/21 0756  . nicotine (NICODERM CQ - dosed in mg/24 hours) patch 21 mg  21 mg Transdermal Daily Larita Fife, MD   21 mg at 01/07/21 0817  . pantoprazole (PROTONIX) EC tablet 40 mg  40 mg Oral Daily Dixon, Rashaun M, NP   40 mg at 01/09/21 0757  . QUEtiapine (SEROQUEL XR) 24 hr tablet 400 mg  400 mg Oral QHS Paliy, Delrae Rend, MD   400 mg at 01/08/21 2201  . zolpidem (AMBIEN) tablet 10 mg  10 mg Oral QHS Deloria Lair, NP   10 mg at 01/08/21 2202    Lab Results:  Results for orders placed or performed during the hospital encounter of  01/04/21 (from the past 48 hour(s))  Glucose, capillary     Status: Abnormal   Collection Time: 01/07/21  3:55 PM  Result Value Ref Range   Glucose-Capillary 157 (H) 70 - 99 mg/dL    Comment: Glucose reference range applies only to samples taken after fasting for  at least 8 hours.  Glucose, capillary     Status: Abnormal   Collection Time: 01/07/21  9:13 PM  Result Value Ref Range   Glucose-Capillary 200 (H) 70 - 99 mg/dL    Comment: Glucose reference range applies only to samples taken after fasting for at least 8 hours.  Glucose, capillary     Status: Abnormal   Collection Time: 01/08/21  6:30 AM  Result Value Ref Range   Glucose-Capillary 170 (H) 70 - 99 mg/dL    Comment: Glucose reference range applies only to samples taken after fasting for at least 8 hours.  Glucose, capillary     Status: Abnormal   Collection Time: 01/08/21 11:35 AM  Result Value Ref Range   Glucose-Capillary 147 (H) 70 - 99 mg/dL    Comment: Glucose reference range applies only to samples taken after fasting for at least 8 hours.  Glucose, capillary     Status: Abnormal   Collection Time: 01/08/21  4:11 PM  Result Value Ref Range   Glucose-Capillary 122 (H) 70 - 99 mg/dL    Comment: Glucose reference range applies only to samples taken after fasting for at least 8 hours.  Glucose, capillary     Status: Abnormal   Collection Time: 01/08/21  9:35 PM  Result Value Ref Range   Glucose-Capillary 182 (H) 70 - 99 mg/dL    Comment: Glucose reference range applies only to samples taken after fasting for at least 8 hours.  Glucose, capillary     Status: Abnormal   Collection Time: 01/09/21  6:47 AM  Result Value Ref Range   Glucose-Capillary 148 (H) 70 - 99 mg/dL    Comment: Glucose reference range applies only to samples taken after fasting for at least 8 hours.  Glucose, capillary     Status: Abnormal   Collection Time: 01/09/21 11:29 AM  Result Value Ref Range   Glucose-Capillary 189 (H) 70 - 99 mg/dL     Comment: Glucose reference range applies only to samples taken after fasting for at least 8 hours.    Blood Alcohol level:  Lab Results  Component Value Date   ETH <10 01/04/2021   ETH 137 (H) [id number]    Metabolic Disorder Labs: Lab Results  Component Value Date   HGBA1C 7.4 (H) 01/04/2021   MPG 165.68 01/04/2021   MPG 165.68 08/26/2019   No results found for: PROLACTIN Lab Results  Component Value Date   CHOL 178 08/26/2019   TRIG 375 (H) 08/26/2019   HDL 23 (L) 08/26/2019   CHOLHDL 7.7 08/26/2019   VLDL 75 (H) 08/26/2019   LDLCALC 80 08/26/2019   LDLCALC 70 12/13/2018    Physical Findings: AIMS: Facial and Oral Movements Muscles of Facial Expression: None, normal Lips and Perioral Area: None, normal Jaw: None, normal Tongue: None, normal,Extremity Movements Upper (arms, wrists, hands, fingers): None, normal Lower (legs, knees, ankles, toes): None, normal, Trunk Movements Neck, shoulders, hips: None, normal, Overall Severity Severity of abnormal movements (highest score from questions above): None, normal Incapacitation due to abnormal movements: None, normal Patient's awareness of abnormal movements (rate only patient's report): No Awareness, Dental Status Current problems with teeth and/or dentures?: No Does patient usually wear dentures?: No  CIWA:    COWS:     Musculoskeletal: Strength & Muscle Tone: within normal limits Gait & Station: normal Patient leans: N/A  Psychiatric Specialty Exam: Physical Exam Vitals and nursing note reviewed.  Constitutional:      Appearance: Normal appearance.  HENT:  Head: Normocephalic and atraumatic.     Right Ear: External ear normal.     Left Ear: External ear normal.     Nose: Nose normal.     Mouth/Throat:     Mouth: Mucous membranes are moist.     Pharynx: Oropharynx is clear.  Eyes:     Extraocular Movements: Extraocular movements intact.     Conjunctiva/sclera: Conjunctivae normal.     Pupils: Pupils  are equal, round, and reactive to light.  Cardiovascular:     Rate and Rhythm: Normal rate.     Pulses: Normal pulses.  Pulmonary:     Effort: Pulmonary effort is normal.     Breath sounds: Normal breath sounds.  Abdominal:     General: Abdomen is flat.     Palpations: Abdomen is soft.  Musculoskeletal:        General: No swelling. Normal range of motion.     Cervical back: Normal range of motion and neck supple.  Skin:    General: Skin is warm and dry.  Neurological:     General: No focal deficit present.     Mental Status: He is alert and oriented to person, place, and time.  Psychiatric:        Attention and Perception: Attention and perception normal.        Mood and Affect: Affect normal. Mood is anxious.        Speech: Speech normal.        Behavior: Behavior is cooperative.        Thought Content: Thought content does not include homicidal or suicidal ideation.        Cognition and Memory: Cognition and memory normal.        Judgment: Judgment normal.     Review of Systems  Constitutional: Negative for appetite change and fatigue.  HENT: Negative for rhinorrhea and sore throat.   Eyes: Negative for photophobia and visual disturbance.  Respiratory: Negative for cough and shortness of breath.   Cardiovascular: Negative for chest pain and palpitations.  Gastrointestinal: Negative for constipation, diarrhea, nausea and vomiting.  Endocrine: Negative for cold intolerance and heat intolerance.  Genitourinary: Negative for difficulty urinating and dysuria.  Musculoskeletal: Negative for arthralgias and myalgias.  Skin: Negative for rash and wound.  Allergic/Immunologic: Negative for food allergies and immunocompromised state.  Neurological: Negative for dizziness and headaches.  Hematological: Negative for adenopathy. Does not bruise/bleed easily.  Psychiatric/Behavioral: Positive for dysphoric mood. Negative for hallucinations and suicidal ideas. The patient is  nervous/anxious.     Blood pressure 124/84, pulse 97, temperature 97.6 F (36.4 C), temperature source Oral, resp. rate 17, height 6' (1.829 m), weight 131.5 kg, SpO2 95 %.Body mass index is 39.33 kg/m.  General Appearance: Casual  Eye Contact:  Good  Speech:  Clear and Coherent and Normal Rate  Volume:  Normal  Mood:  Depressed  Affect:  Congruent and Constricted  Thought Process:  Coherent, Goal Directed and Linear  Orientation:  Full (Time, Place, and Person)  Thought Content:  Logical  Suicidal Thoughts:  No  Homicidal Thoughts:  No  Memory:  Immediate;   Fair Recent;   Fair Remote;   Fair  Judgement:  Fair  Insight:  Fair  Psychomotor Activity:  Normal  Concentration:  Concentration: Fair and Attention Span: Fair  Recall:  [employer] of Knowledge:  Fair  Language:  Fair  Akathisia:  No  Handed:  Right  AIMS (if indicated):     Assets:  Communication Skills Desire for Improvement Resilience Social Support  ADL's:  Intact  Cognition:  WNL  Sleep:  Number of Hours: 7.25     Treatment Plan Summary: Daily contact with patient to assess and evaluate symptoms and progress in treatment and Medication management   Patient is a 60 year old male with the above-stated past psychiatric and medical history who was admitted to [facility] unit secondary to worsened depression and suicidal ideations with a plan. Today continues to reports feeling depressed and anxious due to unstable housing situation; denies suicidal thoughts, plans. Sleep improved. Patient is hopeful and oriented for future. No med changes today.   Plan:  -continue inpatient psych admission; 15-minute checks; daily contact with patient to assess and evaluate symptoms and progress in treatment; psychoeducation.  -continue scheduled medications: . aspirin EC  81 mg Oral Daily  . atorvastatin  40 mg Oral Daily  . busPIRone  20 mg Oral TID  . escitalopram  20 mg Oral Daily  . hydrochlorothiazide  25 mg Oral Daily  .  insulin aspart  0-24 Units Subcutaneous TID AC & HS  . lisinopril  40 mg Oral Daily  . metFORMIN  1,000 mg Oral BID WC  . mometasone-formoterol  2 puff Inhalation BID  . nicotine  21 mg Transdermal Daily  . pantoprazole  40 mg Oral Daily  . QUEtiapine  400 mg Oral QHS  . zolpidem  10 mg Oral QHS    -continue PRN medications.  albuterol, alum & mag hydroxide-simeth, clonazePAM, hydrOXYzine, ibuprofen, magnesium hydroxide  -Pertinent Labs: Hemoglobin A1c 7.4     -Consults: No new consults placed since yesterday    -Disposition: Estimated duration of hospitalization: early next week week. Social worker to help patient get into housing versus rehab versus shelter. All necessary aftercare will be arranged prior to discharge Likely d/c home with outpatient psych follow-up.  -  I certify that the patient does need, on a daily basis, active treatment furnished directly by or requiring the supervision of inpatient psychiatric facility personnel.   [name], MD 01/09/2021, 12:39 PM

## 2021-01-09 NOTE — Progress Notes (Signed)
Patient presents with bright affect. Denies SI, HI, AVH, Endorses depression. Pt is medication compliant. Visible in milieu, socializing with peers. Pt rest will with current medication regimen. Encouragement and support provided. Safety checks maintained. Medications given as prescribed. Pt receptive and remains safe on unit with q 15 min checks.

## 2021-01-09 NOTE — [facility] Group Notes (Signed)
LCSW Group Therapy Note  01/09/2021 2:10 PM  Type of Therapy/Topic:  Group Therapy:  Emotion Regulation  Participation Level:  Did Not Attend   Description of Group:   The purpose of this group is to assist patients in learning to regulate negative emotions and experience positive emotions. Patients will be guided to discuss ways in which they have been vulnerable to their negative emotions. These vulnerabilities will be juxtaposed with experiences of positive emotions or situations, and patients will be challenged to use positive emotions to combat negative ones. Special emphasis will be placed on coping with negative emotions in conflict situations, and patients will process healthy conflict resolution skills.  Therapeutic Goals: 1. Patient will identify two positive emotions or experiences to reflect on in order to balance out negative emotions 2. Patient will label two or more emotions that they find the most difficult to experience 3. Patient will demonstrate positive conflict resolution skills through discussion and/or role plays  Summary of Patient Progress: Patient did not attend group despite encouraged participation.   Therapeutic Modalities:   Cognitive Behavioral Therapy Feelings Identification Dialectical Behavioral Therapy  Jackelyne Sayer, MSW, LCSWA, LCASA 01/09/2021 2:10 PM  

## 2021-01-09 NOTE — Progress Notes (Signed)
Recreation Therapy Notes   Date: 01/09/2021  Time: 9:30 am   Location: Craft room     Behavioral response: N/A   Intervention Topic: Time Management   Discussion/Intervention: Patient did not attend group.   Clinical Observations/Feedback:  Patient did not attend group.   Fitzpatrick Alberico LRT/CTRS        Tayten Bergdoll 01/09/2021 10:39 AM

## 2021-01-10 DIAGNOSIS — K21 Gastroesophageal reflux disease with esophagitis without hemorrhage: Principal | ICD-10-CM

## 2021-01-10 DIAGNOSIS — Z8719 Personal history of other diseases of the digestive system: Principal | ICD-10-CM

## 2021-01-10 DIAGNOSIS — Z8501 Personal history of malignant neoplasm of esophagus: Principal | ICD-10-CM

## 2021-01-10 LAB — GLUCOSE, CAPILLARY
Glucose-Capillary: 142 mg/dL — ABNORMAL HIGH (ref 70–99)
Glucose-Capillary: 152 mg/dL — ABNORMAL HIGH (ref 70–99)
Glucose-Capillary: 102 mg/dL — ABNORMAL HIGH (ref 70–99)
Glucose-Capillary: 164 mg/dL — ABNORMAL HIGH (ref 70–99)

## 2021-01-10 NOTE — Progress Notes (Signed)
Recreation Therapy Notes  Date: 01/10/2021  Time: 9:30 am   Location: Craft room     Behavioral response: N/A   Intervention Topic: Self-esteem   Discussion/Intervention: Patient did not attend group.   Clinical Observations/Feedback:  Patient did not attend group.   Galileo Colello LRT/CTRS        Rina Adney 01/10/2021 11:37 AM

## 2021-01-10 NOTE — Plan of Care (Signed)
  Problem: Coping: Goal: Coping ability will improve Outcome: Progressing   Problem: Medication: Goal: Compliance with prescribed medication regimen will improve Outcome: Progressing   Problem: Self-Concept: Goal: Ability to disclose and discuss suicidal ideas will improve Outcome: Progressing Goal: Will verbalize positive feelings about self Outcome: Progressing   Problem: Education: Goal: Ability to make informed decisions regarding treatment will improve Outcome: Progressing

## 2021-01-10 NOTE — Progress Notes (Signed)
[facility] MD Progress Note  01/10/2021 3:59 PM Ian [name]  MRN:  [id number]  Principal Problem: Severe recurrent major depression without psychotic features [facility]) Diagnosis: Principal Problem:   Severe recurrent major depression without psychotic features ([place]) Active Problems:   HTN (hypertension)   Tobacco use disorder   Generalized anxiety disorder   Type 2 diabetes mellitus with hyperglycemia ([name])  Total Time spent with patient: 30 minutes  [name] a 59y.o. male that has a previous psychiatric history ofdepressionwho presents to the [facility] unit for treatment of depression and suicidal ideations.  Interval History Patient was seen today for re-evaluation.  Nursing reports no events overnight. The patient has no issues with performing ADLs.  Patient has been medication compliant.    Subjective:  On assessment patient reports that he is currently having a panic attack. He notes that the music was on quite loud in the day room, and this caused him to feel very anxious. He started feeling short of breath and dizzy. He left, and asked for PRN for anxiety, and was sitting down near nurses station to collect his thoughts. He denies suicidal ideations, homicidal ideations, visual hallucinations, and auditory hallucinations. He has been in touch with his friend who is helping to repair his trailer, and believes they can replace the floor over the weekend, so he can return early next week. He notes that this has helped his mood.  No side effects from current medications.   Labs: no new results for review.  Past Psychiatric History: Long history of anxiety and depression and mood swings. Back when he was drinking heavily he would have more extreme and erratic behavior but even since he has gotten sober for an extended period his depression and anxiety with limited coping skills have continued. Positive past suicide attempts.  Past Medical History:  Past Medical History:  Diagnosis Date   . Anxiety   . Barrett esophagus   . Cancer ([place])   . Coronary artery disease   . Depression   . GERD (gastroesophageal reflux disease)   . Hypertension   . Pre-diabetes   . Stroke [facility]) 2014   "mini-stroke" per patient    Past Surgical History:  Procedure Laterality Date  . ANGIOPLASTY    . APPENDECTOMY    . CARDIAC CATHETERIZATION    . CHOLECYSTECTOMY    . WRIST SURGERY Right    age 27   Family History:  Family History  Problem Relation Age of Onset  . Stroke Father   . Leukemia Father   . Breast cancer Sister    Family Psychiatric  History: unknown Social History:  Social History   Substance and Sexual Activity  Alcohol Use Not Currently     Social History   Substance and Sexual Activity  Drug Use Yes  . Types: Cocaine, Marijuana    Social History   Socioeconomic History  . Marital status: Divorced    Spouse name: Not on file  . Number of children: Not on file  . Years of education: Not on file  . Highest education level: Not on file  Occupational History  . Not on file  Tobacco Use  . Smoking status: Current Every Day Smoker    Packs/day: 1.00    Types: Cigarettes  . Smokeless tobacco: Never Used  Vaping Use  . Vaping Use: Never used  Substance and Sexual Activity  . Alcohol use: Not Currently  . Drug use: Yes    Types: Cocaine, Marijuana  . Sexual activity: Not  on file  Other Topics Concern  . Not on file  Social History Narrative  . Not on file   Social Determinants of Health   Financial Resource Strain: Not on file  Food Insecurity: Not on file  Transportation Needs: Not on file  Physical Activity: Not on file  Stress: Not on file  Social Connections: Not on file   Additional Social History:                         Sleep: Good  Appetite:  Good  Current Medications: Current Facility-Administered Medications  Medication Dose Route Frequency Provider Last Rate Last Admin  . albuterol (VENTOLIN HFA) 108 (90 Base)  MCG/ACT inhaler 2 puff  2 puff Inhalation Q4H PRN Deloria Lair, NP   2 puff at 01/06/21 1724  . alum & mag hydroxide-simeth (MAALOX/MYLANTA) 200-200-20 MG/5ML suspension 30 mL  30 mL Oral Q4H PRN Dixon, Rashaun M, NP      . aspirin EC tablet 81 mg  81 mg Oral Daily Dixon, Rashaun M, NP   81 mg at 01/10/21 0831  . atorvastatin (LIPITOR) tablet 40 mg  40 mg Oral Daily Dixon, Rashaun M, NP   40 mg at 01/10/21 0831  . busPIRone (BUSPAR) tablet 20 mg  20 mg Oral TID Larita Fife, MD   20 mg at 01/10/21 1231  . clonazePAM (KLONOPIN) tablet 0.5 mg  0.5 mg Oral BID PRN Deloria Lair, NP   0.5 mg at 01/10/21 1542  . escitalopram (LEXAPRO) tablet 20 mg  20 mg Oral Daily Dixon, Rashaun M, NP   20 mg at 01/10/21 0831  . hydrochlorothiazide (HYDRODIURIL) tablet 25 mg  25 mg Oral Daily Dixon, Rashaun M, NP   25 mg at 01/10/21 0831  . hydrOXYzine (ATARAX/VISTARIL) tablet 25 mg  25 mg Oral TID PRN Deloria Lair, NP   25 mg at 01/05/21 2113  . ibuprofen (ADVIL) tablet 400 mg  400 mg Oral Q4H PRN Larita Fife, MD   400 mg at 01/09/21 2128  . insulin aspart (novoLOG) injection 0-24 Units  0-24 Units Subcutaneous TID AC & HS [name], MD   2 Units at 01/10/21 1228  . lisinopril (ZESTRIL) tablet 40 mg  40 mg Oral Daily Dixon, Rashaun M, NP   40 mg at 01/10/21 0831  . magnesium hydroxide (MILK OF MAGNESIA) suspension 30 mL  30 mL Oral Daily PRN Deloria Lair, NP      . metFORMIN (GLUCOPHAGE-XR) 24 hr tablet 1,000 mg  1,000 mg Oral BID WC [name], MD   1,000 mg at 01/10/21 3790  . mometasone-formoterol (DULERA) 200-5 MCG/ACT inhaler 2 puff  2 puff Inhalation BID Deloria Lair, NP   2 puff at 01/10/21 [phone]  . nicotine (NICODERM CQ - dosed in mg/24 hours) patch 21 mg  21 mg Transdermal Daily Larita Fife, MD   21 mg at 01/07/21 0817  . pantoprazole (PROTONIX) EC tablet 40 mg  40 mg Oral Daily Dixon, Rashaun M, NP   40 mg at 01/10/21 0831  . QUEtiapine (SEROQUEL XR) 24 hr tablet 400 mg  400  mg Oral QHS Paliy, Delrae Rend, MD   400 mg at 01/09/21 2128  . zolpidem (AMBIEN) tablet 10 mg  10 mg Oral QHS Deloria Lair, NP   10 mg at 01/09/21 2128    Lab Results:  Results for orders placed or performed during the hospital encounter of 01/04/21 (from  the past 48 hour(s))  Glucose, capillary     Status: Abnormal   Collection Time: 01/08/21  4:11 PM  Result Value Ref Range   Glucose-Capillary 122 (H) 70 - 99 mg/dL    Comment: Glucose reference range applies only to samples taken after fasting for at least 8 hours.  Glucose, capillary     Status: Abnormal   Collection Time: 01/08/21  9:35 PM  Result Value Ref Range   Glucose-Capillary 182 (H) 70 - 99 mg/dL    Comment: Glucose reference range applies only to samples taken after fasting for at least 8 hours.  Glucose, capillary     Status: Abnormal   Collection Time: 01/09/21  6:47 AM  Result Value Ref Range   Glucose-Capillary 148 (H) 70 - 99 mg/dL    Comment: Glucose reference range applies only to samples taken after fasting for at least 8 hours.  Glucose, capillary     Status: Abnormal   Collection Time: 01/09/21 11:29 AM  Result Value Ref Range   Glucose-Capillary 189 (H) 70 - 99 mg/dL    Comment: Glucose reference range applies only to samples taken after fasting for at least 8 hours.  Glucose, capillary     Status: Abnormal   Collection Time: 01/09/21  4:11 PM  Result Value Ref Range   Glucose-Capillary 132 (H) 70 - 99 mg/dL    Comment: Glucose reference range applies only to samples taken after fasting for at least 8 hours.  Glucose, capillary     Status: Abnormal   Collection Time: 01/09/21  8:26 PM  Result Value Ref Range   Glucose-Capillary 120 (H) 70 - 99 mg/dL    Comment: Glucose reference range applies only to samples taken after fasting for at least 8 hours.   Comment 1 Notify RN   Glucose, capillary     Status: Abnormal   Collection Time: 01/10/21  6:56 AM  Result Value Ref Range   Glucose-Capillary 142 (H) 70 -  99 mg/dL    Comment: Glucose reference range applies only to samples taken after fasting for at least 8 hours.  Glucose, capillary     Status: Abnormal   Collection Time: 01/10/21 11:36 AM  Result Value Ref Range   Glucose-Capillary 152 (H) 70 - 99 mg/dL    Comment: Glucose reference range applies only to samples taken after fasting for at least 8 hours.    Blood Alcohol level:  Lab Results  Component Value Date   ETH <10 01/04/2021   ETH 137 (H) 09/05/2020    Metabolic Disorder Labs: Lab Results  Component Value Date   HGBA1C 7.4 (H) 01/04/2021   MPG 165.68 01/04/2021   MPG 165.68 08/26/2019   No results found for: PROLACTIN Lab Results  Component Value Date   CHOL 178 08/26/2019   TRIG 375 (H) 08/26/2019   HDL 23 (L) 08/26/2019   CHOLHDL 7.7 08/26/2019   VLDL 75 (H) 08/26/2019   LDLCALC 80 08/26/2019   LDLCALC 70 12/13/2018    Physical Findings: AIMS: Facial and Oral Movements Muscles of Facial Expression: None, normal Lips and Perioral Area: None, normal Jaw: None, normal Tongue: None, normal,Extremity Movements Upper (arms, wrists, hands, fingers): None, normal Lower (legs, knees, ankles, toes): None, normal, Trunk Movements Neck, shoulders, hips: None, normal, Overall Severity Severity of abnormal movements (highest score from questions above): None, normal Incapacitation due to abnormal movements: None, normal Patient's awareness of abnormal movements (rate only patient's report): No Awareness, Dental Status Current problems with teeth  and/or dentures?: No Does patient usually wear dentures?: No  CIWA:    COWS:     Musculoskeletal: Strength & Muscle Tone: within normal limits Gait & Station: normal Patient leans: N/A  Psychiatric Specialty Exam: Physical Exam Vitals and nursing note reviewed.  Constitutional:      Appearance: Normal appearance.  HENT:     Head: Normocephalic and atraumatic.     Right Ear: External ear normal.     Left Ear:  External ear normal.     Nose: Nose normal.     Mouth/Throat:     Mouth: Mucous membranes are moist.     Pharynx: Oropharynx is clear.  Eyes:     Extraocular Movements: Extraocular movements intact.     Conjunctiva/sclera: Conjunctivae normal.     Pupils: Pupils are equal, round, and reactive to light.  Cardiovascular:     Rate and Rhythm: Normal rate.     Pulses: Normal pulses.  Pulmonary:     Effort: Pulmonary effort is normal.     Breath sounds: Normal breath sounds.  Abdominal:     General: Abdomen is flat.     Palpations: Abdomen is soft.  Musculoskeletal:        General: No swelling. Normal range of motion.     Cervical back: Normal range of motion and neck supple.  Skin:    General: Skin is warm and dry.  Neurological:     General: No focal deficit present.     Mental Status: He is alert and oriented to person, place, and time.  Psychiatric:        Attention and Perception: Attention and perception normal.        Mood and Affect: Affect normal. Mood is anxious.        Speech: Speech normal.        Behavior: Behavior is cooperative.        Thought Content: Thought content does not include homicidal or suicidal ideation.        Cognition and Memory: Cognition and memory normal.        Judgment: Judgment normal.     Review of Systems  Constitutional: Negative for appetite change and fatigue.  HENT: Negative for rhinorrhea and sore throat.   Eyes: Negative for photophobia and visual disturbance.  Respiratory: Negative for cough and shortness of breath.   Cardiovascular: Negative for chest pain and palpitations.  Gastrointestinal: Negative for constipation, diarrhea, nausea and vomiting.  Endocrine: Negative for cold intolerance and heat intolerance.  Genitourinary: Negative for difficulty urinating and dysuria.  Musculoskeletal: Negative for arthralgias and myalgias.  Skin: Negative for rash and wound.  Allergic/Immunologic: Negative for food allergies and  immunocompromised state.  Neurological: Negative for dizziness and headaches.  Hematological: Negative for adenopathy. Does not bruise/bleed easily.  Psychiatric/Behavioral: Positive for dysphoric mood. Negative for hallucinations and suicidal ideas. The patient is nervous/anxious.     Blood pressure 128/81, pulse 91, temperature 97.6 F (36.4 C), temperature source Oral, resp. rate 17, height 6' (1.829 m), weight 131.5 kg, SpO2 97 %.Body mass index is 39.33 kg/m.  General Appearance: Casual  Eye Contact:  Good  Speech:  Clear and Coherent and Normal Rate  Volume:  Normal  Mood:  Depressed  Affect:  Congruent and Constricted  Thought Process:  Coherent, Goal Directed and Linear  Orientation:  Full (Time, Place, and Person)  Thought Content:  Logical  Suicidal Thoughts:  No  Homicidal Thoughts:  No  Memory:  Immediate;   Fair Recent;  Fair Remote;   Fair  Judgement:  Fair  Insight:  Fair  Psychomotor Activity:  Normal  Concentration:  Concentration: Fair and Attention Span: Fair  Recall:  [employer] of Knowledge:  Fair  Language:  Fair  Akathisia:  No  Handed:  Right  AIMS (if indicated):     Assets:  Communication Skills Desire for Improvement Resilience Social Support  ADL's:  Intact  Cognition:  WNL  Sleep:  Number of Hours: 7     Treatment Plan Summary: Daily contact with patient to assess and evaluate symptoms and progress in treatment and Medication management   Patient is a 60 year old male with the above-stated past psychiatric and medical history who was admitted to [facility] unit secondary to worsened depression and suicidal ideations with a plan. Today continues to reports feeling depressed and anxious due to unstable housing situation; denies suicidal thoughts, plans. Sleep improved. Patient is hopeful and oriented for future. No med changes today.   Plan:  -continue inpatient psych admission; 15-minute checks; daily contact with patient to assess and evaluate  symptoms and progress in treatment; psychoeducation.  -continue scheduled medications: . aspirin EC  81 mg Oral Daily  . atorvastatin  40 mg Oral Daily  . busPIRone  20 mg Oral TID  . escitalopram  20 mg Oral Daily  . hydrochlorothiazide  25 mg Oral Daily  . insulin aspart  0-24 Units Subcutaneous TID AC & HS  . lisinopril  40 mg Oral Daily  . metFORMIN  1,000 mg Oral BID WC  . mometasone-formoterol  2 puff Inhalation BID  . nicotine  21 mg Transdermal Daily  . pantoprazole  40 mg Oral Daily  . QUEtiapine  400 mg Oral QHS  . zolpidem  10 mg Oral QHS    -continue PRN medications.  albuterol, alum & mag hydroxide-simeth, clonazePAM, hydrOXYzine, ibuprofen, magnesium hydroxide  -Pertinent Labs: Hemoglobin A1c 7.4     -Consults: No new consults placed since yesterday    -Disposition: Estimated duration of hospitalization: early next week week. Social worker to help patient get into housing versus rehab versus shelter. All necessary aftercare will be arranged prior to discharge Likely d/c home with outpatient psych follow-up.  -  I certify that the patient does need, on a daily basis, active treatment furnished directly by or requiring the supervision of inpatient psychiatric facility personnel.   [name], MD 01/10/2021, 3:59 PM

## 2021-01-10 NOTE — Progress Notes (Signed)
Patient pleasant and cooperative. Denies SI, HI, AVH. Medication compliant. Appropriate with staff and peers. Pt hopeful to go home on Monday. Pt noted in good spirit. Visible in milieu, socializing appropriately with peers. Encouragement and support provided. Safety checks maintained. Medications given as prescribed. Pt receptive and remains safe on unit with q 15 min checks.

## 2021-01-10 NOTE — Progress Notes (Signed)
Pt reports he slept well last night with good appetite. Rates his depression and depression both 5/10 "I'm trying to hold it all in but I'm nervous about my place". Denies SI, HI and AVH. Verbally contracts for safety. Rates his pain "It's like a 2/10 but I'm just going to lay back down so I will be fine". Remains medication compliant and is cooperative with CBG monitoring. Tolerates all medications and meals well without discomfort.  Emotional support and encouragement provided to pt as needed this shift. All medications administered with verbal education and effects monitored. Q 15 minutes safety checks maintained without incident.  Pt did not attend scheduled unit groups despite multiple prompts. Remains safe on unit. Denies concerns at this time.

## 2021-01-10 NOTE — Progress Notes (Signed)
Patient pleasant and cooperative. Denies SI, HI, AVH. Medication compliant. Appropriate with staff and peers. Endorses depression and anxiety, about the mobile home situation. Encouragement and support provided. Safety checks maintained. Medications given as prescribed. Pt receptive and remains safe on unit with q 15 min.

## 2021-01-10 NOTE — [facility] Group Notes (Signed)
LCSW Group Therapy Note  01/10/2021 2:13 PM  Type of Therapy/Topic:  Group Therapy:  Balance in Life  Participation Level:  Did Not Attend  Description of Group:    This group will address the concept of balance and how it feels and looks when one is unbalanced. Patients will be encouraged to process areas in their lives that are out of balance and identify reasons for remaining unbalanced. Facilitators will guide patients in utilizing problem-solving interventions to address and correct the stressor making their life unbalanced. Understanding and applying boundaries will be explored and addressed for obtaining and maintaining a balanced life. Patients will be encouraged to explore ways to assertively make their unbalanced needs known to significant others in their lives, using other group members and facilitator for support and feedback.  Therapeutic Goals: 1. Patient will identify two or more emotions or situations they have that consume much of in their lives. 2. Patient will identify signs/triggers that life has become out of balance:  3. Patient will identify two ways to set boundaries in order to achieve balance in their lives:  4. Patient will demonstrate ability to communicate their needs through discussion and/or role plays  Summary of Patient Progress: X     Therapeutic Modalities:   Cognitive Behavioral Therapy Solution-Focused Therapy Assertiveness Training  Assunta Curtis MSW, LCSW 01/10/2021 2:13 PM

## 2021-01-11 DIAGNOSIS — F332 Major depressive disorder, recurrent severe without psychotic features: Secondary | ICD-10-CM | POA: Diagnosis not present

## 2021-01-11 LAB — GLUCOSE, CAPILLARY
Glucose-Capillary: 164 mg/dL — ABNORMAL HIGH (ref 70–99)
Glucose-Capillary: 143 mg/dL — ABNORMAL HIGH (ref 70–99)
Glucose-Capillary: 94 mg/dL (ref 70–99)
Glucose-Capillary: 111 mg/dL — ABNORMAL HIGH (ref 70–99)

## 2021-01-11 NOTE — [facility] Group Notes (Signed)
LCSW Group Therapy Note   01/11/2021 2:05 PM  Type of Therapy and Topic:  Group Therapy:  Overcoming Obstacles   Participation Level:  Did Not Attend   Description of Group:    In this group patients will be encouraged to explore what they see as obstacles to their own wellness and recovery. They will be guided to discuss their thoughts, feelings, and behaviors related to these obstacles. The group will process together ways to cope with barriers, with attention given to specific choices patients can make. Each patient will be challenged to identify changes they are motivated to make in order to overcome their obstacles. This group will be process-oriented, with patients participating in exploration of their own experiences as well as giving and receiving support and challenge from other group members.   Therapeutic Goals: 1. Patient will identify personal and current obstacles as they relate to admission. 2. Patient will identify barriers that currently interfere with their wellness or overcoming obstacles.  3. Patient will identify feelings, thought process and behaviors related to these barriers. 4. Patient will identify two changes they are willing to make to overcome these obstacles:      Summary of Patient Progress X   Therapeutic Modalities:   Cognitive Behavioral Therapy Solution Focused Therapy Motivational Interviewing Relapse Prevention Therapy  Assunta Curtis, MSW, LCSW 01/11/2021 2:05 PM

## 2021-01-11 NOTE — Progress Notes (Signed)
[facility] MD Progress Note  01/11/2021 11:32 AM Ian [name]  MRN:  [id number]  Principal Problem: Severe recurrent major depression without psychotic features [facility]) Diagnosis: Principal Problem:   Severe recurrent major depression without psychotic features ([name]) Active Problems:   HTN (hypertension)   Tobacco use disorder   Generalized anxiety disorder   Type 2 diabetes mellitus with hyperglycemia ([place])  Total Time spent with patient: 30 minutes  IanSimmonsis a 60y.o. male that has a previous psychiatric history ofdepressionwho presents to the [facility] unit for treatment of depression and suicidal ideations.  Interval History Patient was seen today for re-evaluation.  Nursing reports no events overnight. The patient has no issues with performing ADLs.  Patient has been medication compliant.    Subjective:  On assessment patient reports that he is anxious today. He endorsed dizziness this morning, but says this has subsided.  He denies suicidal ideations, homicidal ideations, visual hallucinations, and auditory hallucinations. He has been in touch with his friend who is helping to repair his trailer, and believes they can replace the floor over the weekend, so he can return early next week. He notes that this has helped his mood.  No side effects from current medications. He continues to express concern about his finger stick glucose readings requiring insulin. He does not have a refrigerator or any proper storage for insulin when he leaves the hospital. His readings have improved after increasing Metformin to 1000 mg BID. Counseling provided on diabetic diet, and changes he can make to meals. He has identified sugar in his coffee causing increased reads in the morning.   Labs: no new results for review.  Past Psychiatric History: Long history of anxiety and depression and mood swings. Back when he was drinking heavily he would have more extreme and erratic behavior but even since he has  gotten sober for an extended period his depression and anxiety with limited coping skills have continued. Positive past suicide attempts.  Past Medical History:  Past Medical History:  Diagnosis Date   Anxiety    Barrett esophagus    Cancer ([place])    Coronary artery disease    Depression    GERD (gastroesophageal reflux disease)    Hypertension    Pre-diabetes    Stroke ([place]) 2014   "mini-stroke" per patient    Past Surgical History:  Procedure Laterality Date   ANGIOPLASTY     APPENDECTOMY     CARDIAC CATHETERIZATION     CHOLECYSTECTOMY     WRIST SURGERY Right    age 60   Family History:  Family History  Problem Relation Age of Onset   Stroke Father    Leukemia Father    Breast cancer Sister    Family Psychiatric  History: unknown Social History:  Social History   Substance and Sexual Activity  Alcohol Use Not Currently     Social History   Substance and Sexual Activity  Drug Use Yes   Types: Cocaine, Marijuana    Social History   Socioeconomic History   Marital status: Divorced    Spouse name: Not on file   Number of children: Not on file   Years of education: Not on file   Highest education level: Not on file  Occupational History   Not on file  Tobacco Use   Smoking status: Current Every Day Smoker    Packs/day: 1.00    Types: Cigarettes   Smokeless tobacco: Never Used  Vaping Use   Vaping Use: Never used  Substance and Sexual Activity   Alcohol use: Not Currently   Drug use: Yes    Types: Cocaine, Marijuana   Sexual activity: Not on file  Other Topics Concern   Not on file  Social History Narrative   Not on file   Social Determinants of Health   Financial Resource Strain: Not on file  Food Insecurity: Not on file  Transportation Needs: Not on file  Physical Activity: Not on file  Stress: Not on file  Social Connections: Not on file   Additional Social History:                          Sleep: Good  Appetite:  Good  Current Medications: Current Facility-Administered Medications  Medication Dose Route Frequency Provider Last Rate Last Admin   albuterol (VENTOLIN HFA) 108 (90 Base) MCG/ACT inhaler 2 puff  2 puff Inhalation Q4H PRN Jearld Lesch, NP   2 puff at 01/06/21 1724   alum & mag hydroxide-simeth (MAALOX/MYLANTA) 200-200-20 MG/5ML suspension 30 mL  30 mL Oral Q4H PRN Jearld Lesch, NP       aspirin EC tablet 81 mg  81 mg Oral Daily Dixon, Rashaun M, NP   81 mg at 01/11/21 0748   atorvastatin (LIPITOR) tablet 40 mg  40 mg Oral Daily Dixon, Rashaun M, NP   40 mg at 01/11/21 0748   busPIRone (BUSPAR) tablet 20 mg  20 mg Oral TID Thalia Party, MD   20 mg at 01/11/21 0748   clonazePAM (KLONOPIN) tablet 0.5 mg  0.5 mg Oral BID PRN Lerry Liner M, NP   0.5 mg at 01/10/21 1542   escitalopram (LEXAPRO) tablet 20 mg  20 mg Oral Daily Dixon, Rashaun M, NP   20 mg at 01/11/21 0748   hydrochlorothiazide (HYDRODIURIL) tablet 25 mg  25 mg Oral Daily Dixon, Rashaun M, NP   25 mg at 01/11/21 0748   hydrOXYzine (ATARAX/VISTARIL) tablet 25 mg  25 mg Oral TID PRN Jearld Lesch, NP   25 mg at 01/05/21 2113   ibuprofen (ADVIL) tablet 400 mg  400 mg Oral Q4H PRN Thalia Party, MD   400 mg at 01/09/21 2128   insulin aspart (novoLOG) injection 0-24 Units  0-24 Units Subcutaneous TID AC & HS Jesse Sans, MD   4 Units at 01/11/21 0747   lisinopril (ZESTRIL) tablet 40 mg  40 mg Oral Daily Lerry Liner M, NP   40 mg at 01/11/21 0748   magnesium hydroxide (MILK OF MAGNESIA) suspension 30 mL  30 mL Oral Daily PRN Jearld Lesch, NP       metFORMIN (GLUCOPHAGE-XR) 24 hr tablet 1,000 mg  1,000 mg Oral BID WC Jesse Sans, MD   1,000 mg at 01/11/21 0815   mometasone-formoterol (DULERA) 200-5 MCG/ACT inhaler 2 puff  2 puff Inhalation BID Lerry Liner M, NP   2 puff at 01/11/21 0754   nicotine (NICODERM CQ - dosed in mg/24 hours) patch 21 mg  21 mg Transdermal  Daily Paliy, Serina Cowper, MD   21 mg at 01/07/21 0817   pantoprazole (PROTONIX) EC tablet 40 mg  40 mg Oral Daily Lerry Liner M, NP   40 mg at 01/11/21 0748   QUEtiapine (SEROQUEL XR) 24 hr tablet 400 mg  400 mg Oral QHS Paliy, Serina Cowper, MD   400 mg at 01/10/21 2141   zolpidem (AMBIEN) tablet 10 mg  10 mg Oral QHS Jearld Lesch, NP  10 mg at 01/10/21 2141    Lab Results:  Results for orders placed or performed during the hospital encounter of 01/04/21 (from the past 48 hour(s))  Glucose, capillary     Status: Abnormal   Collection Time: 01/09/21  4:11 PM  Result Value Ref Range   Glucose-Capillary 132 (H) 70 - 99 mg/dL    Comment: Glucose reference range applies only to samples taken after fasting for at least 8 hours.  Glucose, capillary     Status: Abnormal   Collection Time: 01/09/21  8:26 PM  Result Value Ref Range   Glucose-Capillary 120 (H) 70 - 99 mg/dL    Comment: Glucose reference range applies only to samples taken after fasting for at least 8 hours.   Comment 1 Notify RN   Glucose, capillary     Status: Abnormal   Collection Time: 01/10/21  6:56 AM  Result Value Ref Range   Glucose-Capillary 142 (H) 70 - 99 mg/dL    Comment: Glucose reference range applies only to samples taken after fasting for at least 8 hours.  Glucose, capillary     Status: Abnormal   Collection Time: 01/10/21 11:36 AM  Result Value Ref Range   Glucose-Capillary 152 (H) 70 - 99 mg/dL    Comment: Glucose reference range applies only to samples taken after fasting for at least 8 hours.  Glucose, capillary     Status: Abnormal   Collection Time: 01/10/21  4:01 PM  Result Value Ref Range   Glucose-Capillary 102 (H) 70 - 99 mg/dL    Comment: Glucose reference range applies only to samples taken after fasting for at least 8 hours.  Glucose, capillary     Status: Abnormal   Collection Time: 01/10/21  8:38 PM  Result Value Ref Range   Glucose-Capillary 164 (H) 70 - 99 mg/dL    Comment: Glucose reference  range applies only to samples taken after fasting for at least 8 hours.   Comment 1 Notify RN   Glucose, capillary     Status: Abnormal   Collection Time: 01/11/21  7:02 AM  Result Value Ref Range   Glucose-Capillary 164 (H) 70 - 99 mg/dL    Comment: Glucose reference range applies only to samples taken after fasting for at least 8 hours.  Glucose, capillary     Status: Abnormal   Collection Time: 01/11/21 11:25 AM  Result Value Ref Range   Glucose-Capillary 143 (H) 70 - 99 mg/dL    Comment: Glucose reference range applies only to samples taken after fasting for at least 8 hours.   Comment 1 Notify RN     Blood Alcohol level:  Lab Results  Component Value Date   ETH <10 01/04/2021   ETH 137 (H) 16/09/9603    Metabolic Disorder Labs: Lab Results  Component Value Date   HGBA1C 7.4 (H) 01/04/2021   MPG 165.68 01/04/2021   MPG 165.68 08/26/2019   No results found for: PROLACTIN Lab Results  Component Value Date   CHOL 178 08/26/2019   TRIG 375 (H) 08/26/2019   HDL 23 (L) 08/26/2019   CHOLHDL 7.7 08/26/2019   VLDL 75 (H) 08/26/2019   LDLCALC 80 08/26/2019   LDLCALC 70 12/13/2018    Physical Findings: AIMS: Facial and Oral Movements Muscles of Facial Expression: None, normal Lips and Perioral Area: None, normal Jaw: None, normal Tongue: None, normal,Extremity Movements Upper (arms, wrists, hands, fingers): None, normal Lower (legs, knees, ankles, toes): None, normal, Trunk Movements Neck, shoulders, hips: None,  normal, Overall Severity Severity of abnormal movements (highest score from questions above): None, normal Incapacitation due to abnormal movements: None, normal Patient's awareness of abnormal movements (rate only patient's report): No Awareness, Dental Status Current problems with teeth and/or dentures?: No Does patient usually wear dentures?: No  CIWA:    COWS:     Musculoskeletal: Strength & Muscle Tone: within normal limits Gait & Station:  normal Patient leans: N/A  Psychiatric Specialty Exam: Physical Exam Vitals and nursing note reviewed.  Constitutional:      Appearance: Normal appearance.  HENT:     Head: Normocephalic and atraumatic.     Right Ear: External ear normal.     Left Ear: External ear normal.     Nose: Nose normal.     Mouth/Throat:     Mouth: Mucous membranes are moist.     Pharynx: Oropharynx is clear.  Eyes:     Extraocular Movements: Extraocular movements intact.     Conjunctiva/sclera: Conjunctivae normal.     Pupils: Pupils are equal, round, and reactive to light.  Cardiovascular:     Rate and Rhythm: Normal rate.     Pulses: Normal pulses.  Pulmonary:     Effort: Pulmonary effort is normal.     Breath sounds: Normal breath sounds.  Abdominal:     General: Abdomen is flat.     Palpations: Abdomen is soft.  Musculoskeletal:        General: No swelling. Normal range of motion.     Cervical back: Normal range of motion and neck supple.  Skin:    General: Skin is warm and dry.  Neurological:     General: No focal deficit present.     Mental Status: He is alert and oriented to person, place, and time.  Psychiatric:        Attention and Perception: Attention and perception normal.        Mood and Affect: Affect normal. Mood is anxious.        Speech: Speech normal.        Behavior: Behavior is cooperative.        Thought Content: Thought content does not include homicidal or suicidal ideation.        Cognition and Memory: Cognition and memory normal.        Judgment: Judgment normal.     Review of Systems  Constitutional: Negative for appetite change and fatigue.  HENT: Negative for rhinorrhea and sore throat.   Eyes: Negative for photophobia and visual disturbance.  Respiratory: Negative for cough and shortness of breath.   Cardiovascular: Negative for chest pain and palpitations.  Gastrointestinal: Negative for constipation, diarrhea, nausea and vomiting.  Endocrine: Negative for  cold intolerance and heat intolerance.  Genitourinary: Negative for difficulty urinating and dysuria.  Musculoskeletal: Negative for arthralgias and myalgias.  Skin: Negative for rash and wound.  Allergic/Immunologic: Negative for food allergies and immunocompromised state.  Neurological: Positive for dizziness. Negative for headaches.  Hematological: Negative for adenopathy. Does not bruise/bleed easily.  Psychiatric/Behavioral: Positive for dysphoric mood. Negative for hallucinations and suicidal ideas. The patient is nervous/anxious.     Blood pressure 117/68, pulse 92, temperature 97.7 F (36.5 C), temperature source Oral, resp. rate 17, height 6' (1.829 m), weight 131.5 kg, SpO2 95 %.Body mass index is 39.33 kg/m.  General Appearance: Casual  Eye Contact:  Good  Speech:  Clear and Coherent and Normal Rate  Volume:  Normal  Mood:  Depressed  Affect:  Congruent and Constricted  Thought Process:  Coherent, Goal Directed and Linear  Orientation:  Full (Time, Place, and Person)  Thought Content:  Logical  Suicidal Thoughts:  No  Homicidal Thoughts:  No  Memory:  Immediate;   Fair Recent;   Fair Remote;   Fair  Judgement:  Fair  Insight:  Fair  Psychomotor Activity:  Normal  Concentration:  Concentration: Fair and Attention Span: Fair  Recall:  [employer] of Knowledge:  Fair  Language:  Fair  Akathisia:  No  Handed:  Right  AIMS (if indicated):     Assets:  Communication Skills Desire for Improvement Resilience Social Support  ADL's:  Intact  Cognition:  WNL  Sleep:  Number of Hours: 6.75     Treatment Plan Summary: Daily contact with patient to assess and evaluate symptoms and progress in treatment and Medication management   Patient is a 61 year old male with the above-stated past psychiatric and medical history who was admitted to [facility] unit secondary to worsened depression and suicidal ideations with a plan. Today continues to reports feeling depressed and anxious  due to unstable housing situation; denies suicidal thoughts, plans. Sleep improved. Patient is hopeful and oriented for future. No med changes today.   Plan:  -continue inpatient psych admission; 15-minute checks; daily contact with patient to assess and evaluate symptoms and progress in treatment; psychoeducation.  -continue scheduled medications:  aspirin EC  81 mg Oral Daily   atorvastatin  40 mg Oral Daily   busPIRone  20 mg Oral TID   escitalopram  20 mg Oral Daily   hydrochlorothiazide  25 mg Oral Daily   insulin aspart  0-24 Units Subcutaneous TID AC & HS   lisinopril  40 mg Oral Daily   metFORMIN  1,000 mg Oral BID WC   mometasone-formoterol  2 puff Inhalation BID   nicotine  21 mg Transdermal Daily   pantoprazole  40 mg Oral Daily   QUEtiapine  400 mg Oral QHS   zolpidem  10 mg Oral QHS    -continue PRN medications.  albuterol, alum & mag hydroxide-simeth, clonazePAM, hydrOXYzine, ibuprofen, magnesium hydroxide  -Pertinent Labs: Hemoglobin A1c 7.4     -Consults: No new consults placed since yesterday    -Disposition: Estimated duration of hospitalization: early next week week. Social worker to help patient get into housing versus rehab versus shelter. All necessary aftercare will be arranged prior to discharge Likely d/c home with outpatient psych follow-up.  -  I certify that the patient does need, on a daily basis, active treatment furnished directly by or requiring the supervision of inpatient psychiatric facility personnel.   Jesse Sans, MD 01/11/2021, 11:32 AM

## 2021-01-11 NOTE — Progress Notes (Signed)
Recreation Therapy Notes  Date: 01/11/2021  Time: 9:30 am   Location: Craft room     Behavioral response: N/A   Intervention Topic: Communication    Discussion/Intervention: Patient did not attend group.   Clinical Observations/Feedback:  Patient did not attend group.   Jerita Wimbush LRT/CTRS        Quaneisha Hanisch 01/11/2021 11:47 AM

## 2021-01-11 NOTE — Plan of Care (Signed)
Patient stated that he slept good last night and no issues verbalized.Patient rated his depression and anxiety 5/10 about his housing situation.Patient appropriate with staff & peers.Patient choose not to attend group today.Compliant with medications.Appetite and energy level good.Support and encouragement given.

## 2021-01-12 DIAGNOSIS — F332 Major depressive disorder, recurrent severe without psychotic features: Secondary | ICD-10-CM | POA: Diagnosis not present

## 2021-01-12 LAB — GLUCOSE, CAPILLARY
Glucose-Capillary: 121 mg/dL — ABNORMAL HIGH (ref 70–99)
Glucose-Capillary: 144 mg/dL — ABNORMAL HIGH (ref 70–99)
Glucose-Capillary: 126 mg/dL — ABNORMAL HIGH (ref 70–99)
Glucose-Capillary: 128 mg/dL — ABNORMAL HIGH (ref 70–99)

## 2021-01-12 NOTE — Progress Notes (Signed)
[facility] MD Progress Note  01/12/2021 11:26 AM Ian [name]  MRN:  [id number] Subjective: Follow-up for this patient with recurrent depression and anxiety.  Patient seen and chart reviewed.  Case reviewed with nursing.  Patient reports that his mood is feeling much better.  His anxiety is under better control.  He appreciates the increase in buspirone dose.  He is able to discuss his problems at home with his trailer without panicking.  Able to get along better and be around other people.  No suicidal thoughts currently.  Blood sugars still running slightly elevated but more stable.  He has good insight about this and says he is working on it Principal Problem: Severe recurrent major depression without psychotic features ([place]) Diagnosis: Principal Problem:   Severe recurrent major depression without psychotic features ([place]) Active Problems:   HTN (hypertension)   Tobacco use disorder   Generalized anxiety disorder   Type 2 diabetes mellitus with hyperglycemia ([place])  Total Time spent with patient: 30 minutes  Past Psychiatric History: Past history of recurrent depression and anxiety.  Alcohol abuse in remission.  Past Medical History:  Past Medical History:  Diagnosis Date  . Anxiety   . Barrett esophagus   . Cancer ([place])   . Coronary artery disease   . Depression   . GERD (gastroesophageal reflux disease)   . Hypertension   . Pre-diabetes   . Stroke [facility]) 2014   "mini-stroke" per patient    Past Surgical History:  Procedure Laterality Date  . ANGIOPLASTY    . APPENDECTOMY    . CARDIAC CATHETERIZATION    . CHOLECYSTECTOMY    . WRIST SURGERY Right    age 59   Family History:  Family History  Problem Relation Age of Onset  . Stroke Father   . Leukemia Father   . Breast cancer Sister    Family Psychiatric  History: See previous Social History:  Social History   Substance and Sexual Activity  Alcohol Use Not Currently     Social History   Substance and Sexual  Activity  Drug Use Yes  . Types: Cocaine, Marijuana    Social History   Socioeconomic History  . Marital status: Divorced    Spouse name: Not on file  . Number of children: Not on file  . Years of education: Not on file  . Highest education level: Not on file  Occupational History  . Not on file  Tobacco Use  . Smoking status: Current Every Day Smoker    Packs/day: 1.00    Types: Cigarettes  . Smokeless tobacco: Never Used  Vaping Use  . Vaping Use: Never used  Substance and Sexual Activity  . Alcohol use: Not Currently  . Drug use: Yes    Types: Cocaine, Marijuana  . Sexual activity: Not on file  Other Topics Concern  . Not on file  Social History Narrative  . Not on file   Social Determinants of Health   Financial Resource Strain: Not on file  Food Insecurity: Not on file  Transportation Needs: Not on file  Physical Activity: Not on file  Stress: Not on file  Social Connections: Not on file   Additional Social History:                         Sleep: Fair  Appetite:  Fair  Current Medications: Current Facility-Administered Medications  Medication Dose Route Frequency Provider Last Rate Last Admin  . albuterol (VENTOLIN HFA)  108 (90 Base) MCG/ACT inhaler 2 puff  2 puff Inhalation Q4H PRN Deloria Lair, NP   2 puff at 01/06/21 1724  . alum & mag hydroxide-simeth (MAALOX/MYLANTA) 200-200-20 MG/5ML suspension 30 mL  30 mL Oral Q4H PRN Dixon, Rashaun M, NP      . aspirin EC tablet 81 mg  81 mg Oral Daily Dixon, Rashaun M, NP   81 mg at 01/12/21 0740  . atorvastatin (LIPITOR) tablet 40 mg  40 mg Oral Daily Dixon, Rashaun M, NP   40 mg at 01/12/21 0745  . busPIRone (BUSPAR) tablet 20 mg  20 mg Oral TID Larita Fife, MD   20 mg at 01/12/21 1103  . clonazePAM (KLONOPIN) tablet 0.5 mg  0.5 mg Oral BID PRN Deloria Lair, NP   0.5 mg at 01/10/21 1542  . escitalopram (LEXAPRO) tablet 20 mg  20 mg Oral Daily Dixon, Rashaun M, NP   20 mg at 01/12/21 0740  .  hydrochlorothiazide (HYDRODIURIL) tablet 25 mg  25 mg Oral Daily Dixon, Rashaun M, NP   25 mg at 01/12/21 0740  . hydrOXYzine (ATARAX/VISTARIL) tablet 25 mg  25 mg Oral TID PRN Deloria Lair, NP   25 mg at 01/05/21 2113  . ibuprofen (ADVIL) tablet 400 mg  400 mg Oral Q4H PRN Larita Fife, MD   400 mg at 01/09/21 2128  . insulin aspart (novoLOG) injection 0-24 Units  0-24 Units Subcutaneous TID AC & HS [name], MD   2 Units at 01/12/21 1103  . lisinopril (ZESTRIL) tablet 40 mg  40 mg Oral Daily Dixon, Rashaun M, NP   40 mg at 01/12/21 0740  . magnesium hydroxide (MILK OF MAGNESIA) suspension 30 mL  30 mL Oral Daily PRN Deloria Lair, NP      . metFORMIN (GLUCOPHAGE-XR) 24 hr tablet 1,000 mg  1,000 mg Oral BID WC [name], MD   1,000 mg at 01/12/21 0745  . mometasone-formoterol (DULERA) 200-5 MCG/ACT inhaler 2 puff  2 puff Inhalation BID Dixon, Rashaun M, NP   2 puff at 01/12/21 0746  . nicotine (NICODERM CQ - dosed in mg/24 hours) patch 21 mg  21 mg Transdermal Daily Larita Fife, MD   21 mg at 01/07/21 0817  . pantoprazole (PROTONIX) EC tablet 40 mg  40 mg Oral Daily Dixon, Rashaun M, NP   40 mg at 01/12/21 0740  . QUEtiapine (SEROQUEL XR) 24 hr tablet 400 mg  400 mg Oral QHS Paliy, Delrae Rend, MD   400 mg at 01/11/21 2118  . zolpidem (AMBIEN) tablet 10 mg  10 mg Oral QHS Deloria Lair, NP   10 mg at 01/11/21 2118    Lab Results:  Results for orders placed or performed during the hospital encounter of 01/04/21 (from the past 48 hour(s))  Glucose, capillary     Status: Abnormal   Collection Time: 01/10/21 11:36 AM  Result Value Ref Range   Glucose-Capillary 152 (H) 70 - 99 mg/dL    Comment: Glucose reference range applies only to samples taken after fasting for at least 8 hours.  Glucose, capillary     Status: Abnormal   Collection Time: 01/10/21  4:01 PM  Result Value Ref Range   Glucose-Capillary 102 (H) 70 - 99 mg/dL    Comment: Glucose reference range applies only to  samples taken after fasting for at least 8 hours.  Glucose, capillary     Status: Abnormal   Collection Time: 01/10/21  8:38  PM  Result Value Ref Range   Glucose-Capillary 164 (H) 70 - 99 mg/dL    Comment: Glucose reference range applies only to samples taken after fasting for at least 8 hours.   Comment 1 Notify RN   Glucose, capillary     Status: Abnormal   Collection Time: 01/11/21  7:02 AM  Result Value Ref Range   Glucose-Capillary 164 (H) 70 - 99 mg/dL    Comment: Glucose reference range applies only to samples taken after fasting for at least 8 hours.  Glucose, capillary     Status: Abnormal   Collection Time: 01/11/21 11:25 AM  Result Value Ref Range   Glucose-Capillary 143 (H) 70 - 99 mg/dL    Comment: Glucose reference range applies only to samples taken after fasting for at least 8 hours.   Comment 1 Notify RN   Glucose, capillary     Status: None   Collection Time: 01/11/21  4:15 PM  Result Value Ref Range   Glucose-Capillary 94 70 - 99 mg/dL    Comment: Glucose reference range applies only to samples taken after fasting for at least 8 hours.   Comment 1 Notify RN   Glucose, capillary     Status: Abnormal   Collection Time: 01/11/21  8:41 PM  Result Value Ref Range   Glucose-Capillary 111 (H) 70 - 99 mg/dL    Comment: Glucose reference range applies only to samples taken after fasting for at least 8 hours.  Glucose, capillary     Status: Abnormal   Collection Time: 01/12/21  6:52 AM  Result Value Ref Range   Glucose-Capillary 121 (H) 70 - 99 mg/dL    Comment: Glucose reference range applies only to samples taken after fasting for at least 8 hours.   Comment 1 Notify RN   Glucose, capillary     Status: Abnormal   Collection Time: 01/12/21 10:50 AM  Result Value Ref Range   Glucose-Capillary 144 (H) 70 - 99 mg/dL    Comment: Glucose reference range applies only to samples taken after fasting for at least 8 hours.    Blood Alcohol level:  Lab Results  Component  Value Date   ETH <10 01/04/2021   ETH 137 (H) 63/12/6008    Metabolic Disorder Labs: Lab Results  Component Value Date   HGBA1C 7.4 (H) 01/04/2021   MPG 165.68 01/04/2021   MPG 165.68 08/26/2019   No results found for: PROLACTIN Lab Results  Component Value Date   CHOL 178 08/26/2019   TRIG 375 (H) 08/26/2019   HDL 23 (L) 08/26/2019   CHOLHDL 7.7 08/26/2019   VLDL 75 (H) 08/26/2019   LDLCALC 80 08/26/2019   LDLCALC 70 12/13/2018    Physical Findings: AIMS: Facial and Oral Movements Muscles of Facial Expression: None, normal Lips and Perioral Area: None, normal Jaw: None, normal Tongue: None, normal,Extremity Movements Upper (arms, wrists, hands, fingers): None, normal Lower (legs, knees, ankles, toes): None, normal, Trunk Movements Neck, shoulders, hips: None, normal, Overall Severity Severity of abnormal movements (highest score from questions above): None, normal Incapacitation due to abnormal movements: None, normal Patient's awareness of abnormal movements (rate only patient's report): No Awareness, Dental Status Current problems with teeth and/or dentures?: No Does patient usually wear dentures?: No  CIWA:    COWS:     Musculoskeletal: Strength & Muscle Tone: within normal limits Gait & Station: normal Patient leans: N/A  Psychiatric Specialty Exam: Physical Exam Vitals and nursing note reviewed.  Constitutional:  Appearance: He is well-developed and well-nourished.  HENT:     Head: Normocephalic and atraumatic.  Eyes:     Conjunctiva/sclera: Conjunctivae normal.     Pupils: Pupils are equal, round, and reactive to light.  Cardiovascular:     Heart sounds: Normal heart sounds.  Pulmonary:     Effort: Pulmonary effort is normal.  Abdominal:     Palpations: Abdomen is soft.  Musculoskeletal:        General: Normal range of motion.     Cervical back: Normal range of motion.  Skin:    General: Skin is warm and dry.  Neurological:     General:  No focal deficit present.     Mental Status: He is alert.  Psychiatric:        Mood and Affect: Mood normal.     Review of Systems  Constitutional: Negative.   HENT: Negative.   Eyes: Negative.   Respiratory: Negative.   Cardiovascular: Negative.   Gastrointestinal: Negative.   Musculoskeletal: Negative.   Skin: Negative.   Neurological: Negative.   Psychiatric/Behavioral: Negative.     Blood pressure 112/79, pulse 94, temperature 97.6 F (36.4 C), temperature source Oral, resp. rate 17, height 6' (1.829 m), weight 131.5 kg, SpO2 95 %.Body mass index is 39.33 kg/m.  General Appearance: Casual  Eye Contact:  Fair  Speech:  Slow  Volume:  Decreased  Mood:  Euthymic  Affect:  Constricted  Thought Process:  Goal Directed  Orientation:  Full (Time, Place, and Person)  Thought Content:  Logical  Suicidal Thoughts:  No  Homicidal Thoughts:  No  Memory:  Immediate;   Fair Recent;   Fair Remote;   Fair  Judgement:  Fair  Insight:  Fair  Psychomotor Activity:  Normal  Concentration:  Concentration: Fair  Recall:  [employer] of Knowledge:  Fair  Language:  Fair  Akathisia:  No  Handed:  Right  AIMS (if indicated):     Assets:  Desire for Improvement Housing Resilience Social Support  ADL's:  Intact  Cognition:  WNL  Sleep:  Number of Hours: 7     Treatment Plan Summary: Medication management and Plan Improvement in symptoms.  No suicidal thoughts.  Physically feeling better.  Tolerating medicine well.  Review of medication plan as well as supportive therapy and counseling completed.  No change to medicine ordered for today.  He is hoping for discharge early in the week.  Alethia Berthold, MD 01/12/2021, 11:26 AM

## 2021-01-12 NOTE — BHH Counselor (Signed)
CSW met with patient in day room while completing rounds. Patient reported expressed no needs at this time; shared that he anticipates discharging on Monday. Patient reported that his water had been shut off at home in preparation for plumbing and floor repairs. Patient was concerned that the power and heat may be off due to the recent snowfall. CSW encouraged patient to reach out as needed. Patient is to follow up with [facility] for aftercare.   Signed:  Durenda Hurt, MSW, [place], LCASA 01/12/2021 4:55 PM

## 2021-01-12 NOTE — Plan of Care (Signed)
Pt reports that anxiety and depression are at manageable levels. Pt understands his medications and is receptive to education on his treatment plan.  Problem: Medication: Goal: Compliance with prescribed medication regimen will improve Outcome: Progressing   Problem: Education: Goal: Emotional status will improve Outcome: Progressing   Problem: Education: Goal: Ability to make informed decisions regarding treatment will improve Outcome: Progressing   Problem: Coping: Goal: Coping ability will improve Outcome: Progressing

## 2021-01-12 NOTE — Progress Notes (Signed)
Patient presents as calm and cooperative and his affect is appropriate to the circumstance. He denies SI, HI, and AVH. Patient reports sleeping and eating well. Pt's blood sugar at 7am was 121 and 144 at 11am. Patient states he does not feel depressed or anxious "so far" because he has been asleep most of the morning. Patient denies pain or any other physical concern. He is observed to be interacting with others in the dayroom.   Patient is given medications per MD orders. He is educated on his insulin. Support and encouragement is provided.  Patient remains safe on the unit at this time. Q15 minute safety checks are maintained.

## 2021-01-12 NOTE — Progress Notes (Signed)
Patient is pleasant and easy to engage. CBG taken at 2041 was 111 so no coverage was needed. He is active on the unit and appears to enjoy spending time in the dayroom engaging well with others. He denies SI  HI  AVH anxiety and pain at this encounter. He does endorse depression, but reports getting better. He is med compliant and tolerates his medications without incident. He continues to be monitored for safety and progress and was encouraged to come to staff with any concerns.          Cleo Butler-Nicholson, LPN

## 2021-01-12 NOTE — Plan of Care (Signed)
  Problem: Coping: Goal: Coping ability will improve Outcome: Progressing   Problem: Health Behavior/Discharge Planning: Goal: Identification of resources available to assist in meeting health care needs will improve Outcome: Progressing   Problem: Medication: Goal: Compliance with prescribed medication regimen will improve Outcome: Progressing   Problem: Self-Concept: Goal: Ability to disclose and discuss suicidal ideas will improve Outcome: Progressing Goal: Will verbalize positive feelings about self Outcome: Progressing   Problem: Education: Goal: Knowledge of [place] [facility] Education information/materials will improve Outcome: Progressing Goal: Emotional status will improve Outcome: Progressing Goal: Mental status will improve Outcome: Progressing Goal: Verbalization of understanding the information provided will improve Outcome: Progressing   Problem: Activity: Goal: Interest or engagement in activities will improve Outcome: Progressing Goal: Sleeping patterns will improve Outcome: Progressing   Problem: Coping: Goal: Ability to verbalize frustrations and anger appropriately will improve Outcome: Progressing Goal: Ability to demonstrate self-control will improve Outcome: Progressing   Problem: Health Behavior/Discharge Planning: Goal: Identification of resources available to assist in meeting health care needs will improve Outcome: Progressing Goal: Compliance with treatment plan for underlying cause of condition will improve Outcome: Progressing   Problem: Physical Regulation: Goal: Ability to maintain clinical measurements within normal limits will improve Outcome: Progressing   Problem: Safety: Goal: Periods of time without injury will increase Outcome: Progressing   Problem: Education: Goal: Ability to make informed decisions regarding treatment will improve Outcome: Progressing   Problem: Coping: Goal: Coping ability will improve Outcome:  Progressing   Problem: Health Behavior/Discharge Planning: Goal: Identification of resources available to assist in meeting health care needs will improve Outcome: Progressing   Problem: Medication: Goal: Compliance with prescribed medication regimen will improve Outcome: Progressing   Problem: Self-Concept: Goal: Ability to disclose and discuss suicidal ideas will improve Outcome: Progressing Goal: Will verbalize positive feelings about self Outcome: Progressing

## 2021-01-12 NOTE — BHH Group Notes (Signed)
LCSW Group Therapy Note  01/12/2021 2:31 PM  Type of Therapy and Topic:  Group Therapy: Avoiding Self-Sabotaging and Enabling Behaviors  Participation Level:  Did Not Attend   Description of Group:   In this group, patients will learn how to identify obstacles, self-sabotaging and enabling behaviors, as well as: what are they, why do we do them and what needs these behaviors meet. Discuss unhealthy relationships and how to have positive healthy boundaries with those that sabotage and enable. Explore aspects of self-sabotage and enabling in yourself and how to limit these self-destructive behaviors in everyday life.   Therapeutic Goals: 1. Patient will identify one obstacle that relates to self-sabotage and enabling behaviors 2. Patient will identify one personal self-sabotaging or enabling behavior they did prior to admission 3. Patient will state a plan to change the above identified behavior 4. Patient will demonstrate ability to communicate their needs through discussion and/or role play.   Summary of Patient Progress: Patient did not attend group despite encouraged participation.   Therapeutic Modalities:   Cognitive Behavioral Therapy Person-Centered Therapy Motivational Interviewing   Paulla Dolly, MSW, [place], [place] 01/12/2021 2:31 PM

## 2021-01-13 DIAGNOSIS — F332 Major depressive disorder, recurrent severe without psychotic features: Secondary | ICD-10-CM | POA: Diagnosis not present

## 2021-01-13 LAB — GLUCOSE, CAPILLARY
Glucose-Capillary: 124 mg/dL — ABNORMAL HIGH (ref 70–99)
Glucose-Capillary: 135 mg/dL — ABNORMAL HIGH (ref 70–99)
Glucose-Capillary: 104 mg/dL — ABNORMAL HIGH (ref 70–99)
Glucose-Capillary: 114 mg/dL — ABNORMAL HIGH (ref 70–99)

## 2021-01-13 NOTE — BHH Group Notes (Signed)
[place] LCSW Group Therapy Note  Date/Time: 01/13/2021 @ 1pm  Type of Therapy/Topic:  Group Therapy:  Feelings about Diagnosis  Participation Level:  Did Not Attend   Mood: Did not attend    Description of Group:    This group will allow patients to explore their thoughts and feelings about diagnoses they have received. Patients will be guided to explore their level of understanding and acceptance of these diagnoses. Facilitator will encourage patients to process their thoughts and feelings about the reactions of others to their diagnosis, and will guide patients in identifying ways to discuss their diagnosis with significant others in their lives. This group will be process-oriented, with patients participating in exploration of their own experiences as well as giving and receiving support and challenge from other group members.   Therapeutic Goals: 1. Patient will demonstrate understanding of diagnosis as evidence by identifying two or more symptoms of the disorder:  2. Patient will be able to express two feelings regarding the diagnosis 3. Patient will demonstrate ability to communicate their needs through discussion and/or role plays  Summary of Patient Progress:    Patient did not attend group therapy today.     Therapeutic Modalities:   Cognitive Behavioral Therapy Brief Therapy Feelings Identification   Ardelle Anton, LCSW

## 2021-01-13 NOTE — Plan of Care (Signed)
Patient stated that he could not sleep good last night,thinks because of his anxiety.Patient hoping that his trailer will be ready by Monday. Patient is appropriate with staff & pees.Denies SI,HI and AVH. Compliant with medications.Appetite and energy level good.Support and encouragement given.

## 2021-01-13 NOTE — Progress Notes (Signed)
Cooperative and pleasant, patient had no complaints on shift, he spent most of the evening watching t.v. with peers in the dayroom. He was compliant with medication regime. He appears to be resting in bed quietly.

## 2021-01-13 NOTE — Progress Notes (Signed)
[facility] MD Progress Note  01/13/2021 1:19 PM Ian [name]  MRN:  [id number] Subjective: Patient seen and chart reviewed.  Patient says he is feeling okay.  Has not slept well the last couple nights probably out of anxiety.  Not having panic attacks however and not suicidal. Principal Problem: Severe recurrent major depression without psychotic features ([place]) Diagnosis: Principal Problem:   Severe recurrent major depression without psychotic features ([place]) Active Problems:   HTN (hypertension)   Tobacco use disorder   Generalized anxiety disorder   Type 2 diabetes mellitus with hyperglycemia ([place])  Total Time spent with patient: 30 minutes  Past Psychiatric History: Past history of recurrent depression and anxiety  Past Medical History:  Past Medical History:  Diagnosis Date  . Anxiety   . Barrett esophagus   . Cancer ([place])   . Coronary artery disease   . Depression   . GERD (gastroesophageal reflux disease)   . Hypertension   . Pre-diabetes   . Stroke [facility]) 2014   "mini-stroke" per patient    Past Surgical History:  Procedure Laterality Date  . ANGIOPLASTY    . APPENDECTOMY    . CARDIAC CATHETERIZATION    . CHOLECYSTECTOMY    . WRIST SURGERY Right    age 78   Family History:  Family History  Problem Relation Age of Onset  . Stroke Father   . Leukemia Father   . Breast cancer Sister    Family Psychiatric  History: See previous Social History:  Social History   Substance and Sexual Activity  Alcohol Use Not Currently     Social History   Substance and Sexual Activity  Drug Use Yes  . Types: Cocaine, Marijuana    Social History   Socioeconomic History  . Marital status: Divorced    Spouse name: Not on file  . Number of children: Not on file  . Years of education: Not on file  . Highest education level: Not on file  Occupational History  . Not on file  Tobacco Use  . Smoking status: Current Every Day Smoker    Packs/day: 1.00    Types: Cigarettes   . Smokeless tobacco: Never Used  Vaping Use  . Vaping Use: Never used  Substance and Sexual Activity  . Alcohol use: Not Currently  . Drug use: Yes    Types: Cocaine, Marijuana  . Sexual activity: Not on file  Other Topics Concern  . Not on file  Social History Narrative  . Not on file   Social Determinants of Health   Financial Resource Strain: Not on file  Food Insecurity: Not on file  Transportation Needs: Not on file  Physical Activity: Not on file  Stress: Not on file  Social Connections: Not on file   Additional Social History:                         Sleep: Fair  Appetite:  Fair  Current Medications: Current Facility-Administered Medications  Medication Dose Route Frequency Provider Last Rate Last Admin  . albuterol (VENTOLIN HFA) 108 (90 Base) MCG/ACT inhaler 2 puff  2 puff Inhalation Q4H PRN Deloria Lair, NP   2 puff at 01/06/21 1724  . alum & mag hydroxide-simeth (MAALOX/MYLANTA) 200-200-20 MG/5ML suspension 30 mL  30 mL Oral Q4H PRN Dixon, Rashaun M, NP      . aspirin EC tablet 81 mg  81 mg Oral Daily Dixon, Rashaun M, NP   81 mg at  01/13/21 0804  . atorvastatin (LIPITOR) tablet 40 mg  40 mg Oral Daily Dixon, Rashaun M, NP   40 mg at 01/13/21 0804  . busPIRone (BUSPAR) tablet 20 mg  20 mg Oral TID Thalia PartyPaliy, Alisa, MD   20 mg at 01/13/21 1155  . clonazePAM (KLONOPIN) tablet 0.5 mg  0.5 mg Oral BID PRN Jearld Leschixon, Rashaun M, NP   0.5 mg at 01/12/21 1751  . escitalopram (LEXAPRO) tablet 20 mg  20 mg Oral Daily Dixon, Rashaun M, NP   20 mg at 01/13/21 0804  . hydrochlorothiazide (HYDRODIURIL) tablet 25 mg  25 mg Oral Daily Dixon, Rashaun M, NP   25 mg at 01/13/21 0804  . hydrOXYzine (ATARAX/VISTARIL) tablet 25 mg  25 mg Oral TID PRN Jearld Leschixon, Rashaun M, NP   25 mg at 01/05/21 2113  . ibuprofen (ADVIL) tablet 400 mg  400 mg Oral Q4H PRN Thalia PartyPaliy, Alisa, MD   400 mg at 01/12/21 1751  . insulin aspart (novoLOG) injection 0-24 Units  0-24 Units Subcutaneous TID AC & HS  Jesse SansFreeman, Megan M, MD   2 Units at 01/13/21 1155  . lisinopril (ZESTRIL) tablet 40 mg  40 mg Oral Daily Dixon, Rashaun M, NP   40 mg at 01/13/21 0804  . magnesium hydroxide (MILK OF MAGNESIA) suspension 30 mL  30 mL Oral Daily PRN Jearld Leschixon, Rashaun M, NP      . metFORMIN (GLUCOPHAGE-XR) 24 hr tablet 1,000 mg  1,000 mg Oral BID WC Jesse SansFreeman, Megan M, MD   1,000 mg at 01/13/21 0805  . mometasone-formoterol (DULERA) 200-5 MCG/ACT inhaler 2 puff  2 puff Inhalation BID Jearld Leschixon, Rashaun M, NP   2 puff at 01/13/21 0809  . nicotine (NICODERM CQ - dosed in mg/24 hours) patch 21 mg  21 mg Transdermal Daily Thalia PartyPaliy, Alisa, MD   21 mg at 01/07/21 0817  . pantoprazole (PROTONIX) EC tablet 40 mg  40 mg Oral Daily Dixon, Rashaun M, NP   40 mg at 01/13/21 0805  . QUEtiapine (SEROQUEL XR) 24 hr tablet 400 mg  400 mg Oral QHS Paliy, Serina CowperAlisa, MD   400 mg at 01/12/21 2127  . zolpidem (AMBIEN) tablet 10 mg  10 mg Oral QHS Jearld Leschixon, Rashaun M, NP   10 mg at 01/12/21 2127    Lab Results:  Results for orders placed or performed during the hospital encounter of 01/04/21 (from the past 48 hour(s))  Glucose, capillary     Status: None   Collection Time: 01/11/21  4:15 PM  Result Value Ref Range   Glucose-Capillary 94 70 - 99 mg/dL    Comment: Glucose reference range applies only to samples taken after fasting for at least 8 hours.   Comment 1 Notify RN   Glucose, capillary     Status: Abnormal   Collection Time: 01/11/21  8:41 PM  Result Value Ref Range   Glucose-Capillary 111 (H) 70 - 99 mg/dL    Comment: Glucose reference range applies only to samples taken after fasting for at least 8 hours.  Glucose, capillary     Status: Abnormal   Collection Time: 01/12/21  6:52 AM  Result Value Ref Range   Glucose-Capillary 121 (H) 70 - 99 mg/dL    Comment: Glucose reference range applies only to samples taken after fasting for at least 8 hours.   Comment 1 Notify RN   Glucose, capillary     Status: Abnormal   Collection Time: 01/12/21  10:50 AM  Result Value Ref Range  Glucose-Capillary 144 (H) 70 - 99 mg/dL    Comment: Glucose reference range applies only to samples taken after fasting for at least 8 hours.  Glucose, capillary     Status: Abnormal   Collection Time: 01/12/21  4:01 PM  Result Value Ref Range   Glucose-Capillary 126 (H) 70 - 99 mg/dL    Comment: Glucose reference range applies only to samples taken after fasting for at least 8 hours.  Glucose, capillary     Status: Abnormal   Collection Time: 01/12/21  8:09 PM  Result Value Ref Range   Glucose-Capillary 128 (H) 70 - 99 mg/dL    Comment: Glucose reference range applies only to samples taken after fasting for at least 8 hours.  Glucose, capillary     Status: Abnormal   Collection Time: 01/13/21  6:58 AM  Result Value Ref Range   Glucose-Capillary 124 (H) 70 - 99 mg/dL    Comment: Glucose reference range applies only to samples taken after fasting for at least 8 hours.  Glucose, capillary     Status: Abnormal   Collection Time: 01/13/21 11:41 AM  Result Value Ref Range   Glucose-Capillary 135 (H) 70 - 99 mg/dL    Comment: Glucose reference range applies only to samples taken after fasting for at least 8 hours.   Comment 1 Notify RN     Blood Alcohol level:  Lab Results  Component Value Date   ETH <10 01/04/2021   ETH 137 (H) [id number]    Metabolic Disorder Labs: Lab Results  Component Value Date   HGBA1C 7.4 (H) 01/04/2021   MPG 165.68 01/04/2021   MPG 165.68 08/26/2019   No results found for: PROLACTIN Lab Results  Component Value Date   CHOL 178 08/26/2019   TRIG 375 (H) 08/26/2019   HDL 23 (L) 08/26/2019   CHOLHDL 7.7 08/26/2019   VLDL 75 (H) 08/26/2019   LDLCALC 80 08/26/2019   LDLCALC 70 12/13/2018    Physical Findings: AIMS: Facial and Oral Movements Muscles of Facial Expression: None, normal Lips and Perioral Area: None, normal Jaw: None, normal Tongue: None, normal,Extremity Movements Upper (arms, wrists, hands,  fingers): None, normal Lower (legs, knees, ankles, toes): None, normal, Trunk Movements Neck, shoulders, hips: None, normal, Overall Severity Severity of abnormal movements (highest score from questions above): None, normal Incapacitation due to abnormal movements: None, normal Patient's awareness of abnormal movements (rate only patient's report): No Awareness, Dental Status Current problems with teeth and/or dentures?: No Does patient usually wear dentures?: No  CIWA:    COWS:     Musculoskeletal: Strength & Muscle Tone: within normal limits Gait & Station: normal Patient leans: N/A  Psychiatric Specialty Exam: Physical Exam Vitals and nursing note reviewed.  Constitutional:      Appearance: He is well-developed and well-nourished.  HENT:     Head: Normocephalic and atraumatic.  Eyes:     Conjunctiva/sclera: Conjunctivae normal.     Pupils: Pupils are equal, round, and reactive to light.  Cardiovascular:     Heart sounds: Normal heart sounds.  Pulmonary:     Effort: Pulmonary effort is normal.  Abdominal:     Palpations: Abdomen is soft.  Musculoskeletal:        General: Normal range of motion.     Cervical back: Normal range of motion.  Skin:    General: Skin is warm and dry.  Neurological:     General: No focal deficit present.     Mental Status: He is alert.  Psychiatric:        Mood and Affect: Mood normal.     Review of Systems  Constitutional: Negative.   HENT: Negative.   Eyes: Negative.   Respiratory: Negative.   Cardiovascular: Negative.   Gastrointestinal: Negative.   Musculoskeletal: Negative.   Skin: Negative.   Neurological: Negative.   Psychiatric/Behavioral: Negative.     Blood pressure (!) 127/91, pulse 86, temperature 97.7 F (36.5 C), temperature source Oral, resp. rate 17, height 6' (1.829 m), weight 131.5 kg, SpO2 96 %.Body mass index is 39.33 kg/m.  General Appearance: Casual  Eye Contact:  Fair  Speech:  Slow  Volume:  Decreased   Mood:  Anxious  Affect:  Congruent  Thought Process:  Goal Directed  Orientation:  Full (Time, Place, and Person)  Thought Content:  Logical  Suicidal Thoughts:  No  Homicidal Thoughts:  No  Memory:  Immediate;   Fair Recent;   Fair Remote;   Fair  Judgement:  Fair  Insight:  Fair  Psychomotor Activity:  Normal  Concentration:  Concentration: Fair  Recall:  [employer] of Knowledge:  Fair  Language:  Fair  Akathisia:  No  Handed:  Right  AIMS (if indicated):     Assets:  Desire for Improvement Housing Physical Health Social Support  ADL's:  Intact  Cognition:  Impaired,  Mild  Sleep:  Number of Hours: 6.75     Treatment Plan Summary: Medication management and Plan No change to medicine.  Supportive therapy and encouragement review of plan.  Repeated praise for his sobriety.  Patient is still looking forward to likely discharge tomorrow.  Alethia Berthold, MD 01/13/2021, 1:19 PM

## 2021-01-14 DIAGNOSIS — F332 Major depressive disorder, recurrent severe without psychotic features: Secondary | ICD-10-CM | POA: Diagnosis not present

## 2021-01-14 LAB — GLUCOSE, CAPILLARY
Glucose-Capillary: 110 mg/dL — ABNORMAL HIGH (ref 70–99)
Glucose-Capillary: 102 mg/dL — ABNORMAL HIGH (ref 70–99)
Glucose-Capillary: 98 mg/dL (ref 70–99)
Glucose-Capillary: 100 mg/dL — ABNORMAL HIGH (ref 70–99)

## 2021-01-14 NOTE — [facility] Group Notes (Signed)
[place] [facility] Group Notes:  (Nursing/MHT/Case Management/Adjunct)  Date:  01/14/2021  Time:  9:21 PM  Type of Therapy:  Group Therapy  Participation Level:  Active  Participation Quality:  Appropriate  Affect:  Appropriate  Cognitive:  Alert  Insight:  Good  Engagement in Group:  Engaged and talk about discharge tomorrow  Modes of Intervention:  Support  Summary of Progress/Problems:  Ian [name] 01/14/2021, 9:21 PM

## 2021-01-14 NOTE — Progress Notes (Signed)
[facility] MD Progress Note  01/14/2021 3:57 PM Alexius Hangartner  MRN:  [id number]  Principal Problem: Severe recurrent major depression without psychotic features [facility]) Diagnosis: Principal Problem:   Severe recurrent major depression without psychotic features ([place]) Active Problems:   HTN (hypertension)   Tobacco use disorder   Generalized anxiety disorder   Type 2 diabetes mellitus with hyperglycemia ([place])  Total Time spent with patient: 30 minutes  [name] a 60y.o. male that has a previous psychiatric history ofdepressionwho presents to the [facility] unit for treatment of depression and suicidal ideations.  Interval History Patient was seen today for re-evaluation.  Nursing reports no events overnight. The patient has no issues with performing ADLs.  Patient has been medication compliant.    Subjective:  On assessment patient reports that he is anxious today.  He denies suicidal ideations, homicidal ideations, visual hallucinations, and auditory hallucinations. He has been in touch with his friend who is helping to repair his trailer, and states they are finishing up his floor today, and the water has been turned back on. He states it should be ready for him tomorrow.  No side effects from current medications. Counseling provided on diabetic diet, and changes he can make to meals. He has identified sugar in his coffee causing increased reads in the morning.   [place], [phone], and he confirms he will be able to pick up patient tomorrow and take him back home.   Labs: no new results for review.  Past Psychiatric History: Long history of anxiety and depression and mood swings. Back when he was drinking heavily he would have more extreme and erratic behavior but even since he has gotten sober for an extended period his depression and anxiety with limited coping skills have continued. Positive past suicide attempts.  Past Medical History:  Past Medical History:  Diagnosis  Date  . Anxiety   . Barrett esophagus   . Cancer ([place])   . Coronary artery disease   . Depression   . GERD (gastroesophageal reflux disease)   . Hypertension   . Pre-diabetes   . Stroke [facility]) 2014   "mini-stroke" per patient    Past Surgical History:  Procedure Laterality Date  . ANGIOPLASTY    . APPENDECTOMY    . CARDIAC CATHETERIZATION    . CHOLECYSTECTOMY    . WRIST SURGERY Right    age 51   Family History:  Family History  Problem Relation Age of Onset  . Stroke Father   . Leukemia Father   . Breast cancer Sister    Family Psychiatric  History: unknown Social History:  Social History   Substance and Sexual Activity  Alcohol Use Not Currently     Social History   Substance and Sexual Activity  Drug Use Yes  . Types: Cocaine, Marijuana    Social History   Socioeconomic History  . Marital status: Divorced    Spouse name: Not on file  . Number of children: Not on file  . Years of education: Not on file  . Highest education level: Not on file  Occupational History  . Not on file  Tobacco Use  . Smoking status: Current Every Day Smoker    Packs/day: 1.00    Types: Cigarettes  . Smokeless tobacco: Never Used  Vaping Use  . Vaping Use: Never used  Substance and Sexual Activity  . Alcohol use: Not Currently  . Drug use: Yes    Types: Cocaine, Marijuana  . Sexual activity: Not on file  Other Topics Concern  . Not on file  Social History Narrative  . Not on file   Social Determinants of Health   Financial Resource Strain: Not on file  Food Insecurity: Not on file  Transportation Needs: Not on file  Physical Activity: Not on file  Stress: Not on file  Social Connections: Not on file   Additional Social History:                         Sleep: Good  Appetite:  Good  Current Medications: Current Facility-Administered Medications  Medication Dose Route Frequency Provider Last Rate Last Admin  . albuterol (VENTOLIN HFA) 108 (90 Base)  MCG/ACT inhaler 2 puff  2 puff Inhalation Q4H PRN Deloria Lair, NP   2 puff at 01/06/21 1724  . alum & mag hydroxide-simeth (MAALOX/MYLANTA) 200-200-20 MG/5ML suspension 30 mL  30 mL Oral Q4H PRN Dixon, Rashaun M, NP      . aspirin EC tablet 81 mg  81 mg Oral Daily Dixon, Rashaun M, NP   81 mg at 01/14/21 0759  . atorvastatin (LIPITOR) tablet 40 mg  40 mg Oral Daily Dixon, Rashaun M, NP   40 mg at 01/14/21 0759  . busPIRone (BUSPAR) tablet 20 mg  20 mg Oral TID Larita Fife, MD   20 mg at 01/14/21 1201  . clonazePAM (KLONOPIN) tablet 0.5 mg  0.5 mg Oral BID PRN Deloria Lair, NP   0.5 mg at 01/13/21 1853  . escitalopram (LEXAPRO) tablet 20 mg  20 mg Oral Daily Dixon, Rashaun M, NP   20 mg at 01/14/21 0759  . hydrochlorothiazide (HYDRODIURIL) tablet 25 mg  25 mg Oral Daily Dixon, Rashaun M, NP   25 mg at 01/14/21 0759  . hydrOXYzine (ATARAX/VISTARIL) tablet 25 mg  25 mg Oral TID PRN Deloria Lair, NP   25 mg at 01/05/21 2113  . ibuprofen (ADVIL) tablet 400 mg  400 mg Oral Q4H PRN Larita Fife, MD   400 mg at 01/13/21 1853  . insulin aspart (novoLOG) injection 0-24 Units  0-24 Units Subcutaneous TID AC & HS [name], MD   2 Units at 01/13/21 1155  . lisinopril (ZESTRIL) tablet 40 mg  40 mg Oral Daily Dixon, Rashaun M, NP   40 mg at 01/14/21 0759  . magnesium hydroxide (MILK OF MAGNESIA) suspension 30 mL  30 mL Oral Daily PRN Deloria Lair, NP      . metFORMIN (GLUCOPHAGE-XR) 24 hr tablet 1,000 mg  1,000 mg Oral BID WC [name], MD   1,000 mg at 01/14/21 0759  . mometasone-formoterol (DULERA) 200-5 MCG/ACT inhaler 2 puff  2 puff Inhalation BID Deloria Lair, NP   2 puff at 01/14/21 0758  . nicotine (NICODERM CQ - dosed in mg/24 hours) patch 21 mg  21 mg Transdermal Daily Larita Fife, MD   21 mg at 01/07/21 0817  . pantoprazole (PROTONIX) EC tablet 40 mg  40 mg Oral Daily Dixon, Rashaun M, NP   40 mg at 01/14/21 0759  . QUEtiapine (SEROQUEL XR) 24 hr tablet 400 mg  400  mg Oral QHS Paliy, Delrae Rend, MD   400 mg at 01/13/21 2128  . zolpidem (AMBIEN) tablet 10 mg  10 mg Oral QHS Deloria Lair, NP   10 mg at 01/13/21 2128    Lab Results:  Results for orders placed or performed during the hospital encounter of 01/04/21 (from the past 48  hour(s))  Glucose, capillary     Status: Abnormal   Collection Time: 01/12/21  4:01 PM  Result Value Ref Range   Glucose-Capillary 126 (H) 70 - 99 mg/dL    Comment: Glucose reference range applies only to samples taken after fasting for at least 8 hours.  Glucose, capillary     Status: Abnormal   Collection Time: 01/12/21  8:09 PM  Result Value Ref Range   Glucose-Capillary 128 (H) 70 - 99 mg/dL    Comment: Glucose reference range applies only to samples taken after fasting for at least 8 hours.  Glucose, capillary     Status: Abnormal   Collection Time: 01/13/21  6:58 AM  Result Value Ref Range   Glucose-Capillary 124 (H) 70 - 99 mg/dL    Comment: Glucose reference range applies only to samples taken after fasting for at least 8 hours.  Glucose, capillary     Status: Abnormal   Collection Time: 01/13/21 11:41 AM  Result Value Ref Range   Glucose-Capillary 135 (H) 70 - 99 mg/dL    Comment: Glucose reference range applies only to samples taken after fasting for at least 8 hours.   Comment 1 Notify RN   Glucose, capillary     Status: Abnormal   Collection Time: 01/13/21  4:20 PM  Result Value Ref Range   Glucose-Capillary 104 (H) 70 - 99 mg/dL    Comment: Glucose reference range applies only to samples taken after fasting for at least 8 hours.   Comment 1 Notify RN   Glucose, capillary     Status: Abnormal   Collection Time: 01/13/21  8:04 PM  Result Value Ref Range   Glucose-Capillary 114 (H) 70 - 99 mg/dL    Comment: Glucose reference range applies only to samples taken after fasting for at least 8 hours.  Glucose, capillary     Status: Abnormal   Collection Time: 01/14/21  7:05 AM  Result Value Ref Range    Glucose-Capillary 110 (H) 70 - 99 mg/dL    Comment: Glucose reference range applies only to samples taken after fasting for at least 8 hours.  Glucose, capillary     Status: Abnormal   Collection Time: 01/14/21 11:39 AM  Result Value Ref Range   Glucose-Capillary 102 (H) 70 - 99 mg/dL    Comment: Glucose reference range applies only to samples taken after fasting for at least 8 hours.    Blood Alcohol level:  Lab Results  Component Value Date   ETH <10 01/04/2021   ETH 137 (H) 09/05/2020    Metabolic Disorder Labs: Lab Results  Component Value Date   HGBA1C 7.4 (H) 01/04/2021   MPG 165.68 01/04/2021   MPG 165.68 08/26/2019   No results found for: PROLACTIN Lab Results  Component Value Date   CHOL 178 08/26/2019   TRIG 375 (H) 08/26/2019   HDL 23 (L) 08/26/2019   CHOLHDL 7.7 08/26/2019   VLDL 75 (H) 08/26/2019   LDLCALC 80 08/26/2019   LDLCALC 70 12/13/2018    Physical Findings: AIMS: Facial and Oral Movements Muscles of Facial Expression: None, normal Lips and Perioral Area: None, normal Jaw: None, normal Tongue: None, normal,Extremity Movements Upper (arms, wrists, hands, fingers): None, normal Lower (legs, knees, ankles, toes): None, normal, Trunk Movements Neck, shoulders, hips: None, normal, Overall Severity Severity of abnormal movements (highest score from questions above): None, normal Incapacitation due to abnormal movements: None, normal Patient's awareness of abnormal movements (rate only patient's report): No Awareness, Dental Status  Current problems with teeth and/or dentures?: No Does patient usually wear dentures?: No  CIWA:    COWS:     Musculoskeletal: Strength & Muscle Tone: within normal limits Gait & Station: normal Patient leans: N/A  Psychiatric Specialty Exam: Physical Exam Vitals and nursing note reviewed.  Constitutional:      Appearance: Normal appearance.  HENT:     Head: Normocephalic and atraumatic.     Right Ear: External  ear normal.     Left Ear: External ear normal.     Nose: Nose normal.     Mouth/Throat:     Mouth: Mucous membranes are moist.     Pharynx: Oropharynx is clear.  Eyes:     Extraocular Movements: Extraocular movements intact.     Conjunctiva/sclera: Conjunctivae normal.     Pupils: Pupils are equal, round, and reactive to light.  Cardiovascular:     Rate and Rhythm: Normal rate.     Pulses: Normal pulses.  Pulmonary:     Effort: Pulmonary effort is normal.     Breath sounds: Normal breath sounds.  Abdominal:     General: Abdomen is flat.     Palpations: Abdomen is soft.  Musculoskeletal:        General: No swelling. Normal range of motion.     Cervical back: Normal range of motion and neck supple.  Skin:    General: Skin is warm and dry.  Neurological:     General: No focal deficit present.     Mental Status: He is alert and oriented to person, place, and time.  Psychiatric:        Attention and Perception: Attention and perception normal.        Mood and Affect: Affect normal. Mood is anxious.        Speech: Speech normal.        Behavior: Behavior is cooperative.        Thought Content: Thought content does not include homicidal or suicidal ideation.        Cognition and Memory: Cognition and memory normal.        Judgment: Judgment normal.     Review of Systems  Constitutional: Negative for appetite change and fatigue.  HENT: Negative for rhinorrhea and sore throat.   Eyes: Negative for photophobia and visual disturbance.  Respiratory: Negative for cough and shortness of breath.   Cardiovascular: Negative for chest pain and palpitations.  Gastrointestinal: Negative for constipation, diarrhea, nausea and vomiting.  Endocrine: Negative for cold intolerance and heat intolerance.  Genitourinary: Negative for difficulty urinating and dysuria.  Musculoskeletal: Negative for arthralgias and myalgias.  Skin: Negative for rash and wound.  Allergic/Immunologic: Negative for  food allergies and immunocompromised state.  Neurological: Positive for dizziness. Negative for headaches.  Hematological: Negative for adenopathy. Does not bruise/bleed easily.  Psychiatric/Behavioral: Positive for dysphoric mood. Negative for hallucinations and suicidal ideas. The patient is nervous/anxious.     Blood pressure 126/74, pulse 86, temperature 98 F (36.7 C), temperature source Oral, resp. rate 17, height 6' (1.829 m), weight 131.5 kg, SpO2 97 %.Body mass index is 39.33 kg/m.  General Appearance: Casual  Eye Contact:  Good  Speech:  Clear and Coherent and Normal Rate  Volume:  Normal  Mood:  Depressed  Affect:  Congruent and Constricted  Thought Process:  Coherent, Goal Directed and Linear  Orientation:  Full (Time, Place, and Person)  Thought Content:  Logical  Suicidal Thoughts:  No  Homicidal Thoughts:  No  Memory:  Immediate;   Fair Recent;   Fair Remote;   Fair  Judgement:  Fair  Insight:  Fair  Psychomotor Activity:  Normal  Concentration:  Concentration: Fair and Attention Span: Fair  Recall:  [employer] of Knowledge:  Fair  Language:  Fair  Akathisia:  No  Handed:  Right  AIMS (if indicated):     Assets:  Communication Skills Desire for Improvement Resilience Social Support  ADL's:  Intact  Cognition:  WNL  Sleep:  Number of Hours: 7.5     Treatment Plan Summary: Daily contact with patient to assess and evaluate symptoms and progress in treatment and Medication management   Patient is a 60 year old male with the above-stated past psychiatric and medical history who was admitted to [facility] unit secondary to worsened depression and suicidal ideations with a plan. Sleep improved. Patient is hopeful and oriented for future. No med changes today.   Plan:  -continue inpatient psych admission; 15-minute checks; daily contact with patient to assess and evaluate symptoms and progress in treatment; psychoeducation.  -continue scheduled medications: .  aspirin EC  81 mg Oral Daily  . atorvastatin  40 mg Oral Daily  . busPIRone  20 mg Oral TID  . escitalopram  20 mg Oral Daily  . hydrochlorothiazide  25 mg Oral Daily  . insulin aspart  0-24 Units Subcutaneous TID AC & HS  . lisinopril  40 mg Oral Daily  . metFORMIN  1,000 mg Oral BID WC  . mometasone-formoterol  2 puff Inhalation BID  . nicotine  21 mg Transdermal Daily  . pantoprazole  40 mg Oral Daily  . QUEtiapine  400 mg Oral QHS  . zolpidem  10 mg Oral QHS    -continue PRN medications.  albuterol, alum & mag hydroxide-simeth, clonazePAM, hydrOXYzine, ibuprofen, magnesium hydroxide  -Pertinent Labs: Hemoglobin A1c 7.4     -Consults: No new consults placed since yesterday    -Disposition: Estimated duration of hospitalization: dc home tomorrow with outpatient follow-up  -  I certify that the patient does need, on a daily basis, active treatment furnished directly by or requiring the supervision of inpatient psychiatric facility personnel.   [name], MD 01/14/2021, 3:57 PM

## 2021-01-14 NOTE — Progress Notes (Signed)
Recreation Therapy Notes   Date: 01/14/2021  Time: 9:30 am   Location: Craft room     Behavioral response: N/A   Intervention Topic: Goals    Discussion/Intervention: Patient did not attend group.   Clinical Observations/Feedback:  Patient did not attend group.   Asako Saliba LRT/CTRS       Maddix Heinz 01/14/2021 11:07 AM

## 2021-01-14 NOTE — Plan of Care (Signed)
Patient appropriate with staff & peers.Denies SI,HI and AVH. Patient looking forward for discharge tomorrow.Appetite and energy level good.Support and encouragement given.

## 2021-01-14 NOTE — Progress Notes (Signed)
Cooperative and pleasant on approach, patient had no complaints on shift, he spent most of the evening watching t.v. with peers in the dayroom. He was compliant with medication regime. Some education was completed on maintaining within normal limit blood sugar.   He appears to be in bed  resting in bed quietly.

## 2021-01-14 NOTE — Tx Team (Signed)
Interdisciplinary Treatment and Diagnostic Plan Update  01/14/2021 Time of Session: 8:30AM Ian [name] MRN: [id number]  Principal Diagnosis: Severe recurrent major depression without psychotic features [facility])  Secondary Diagnoses: Principal Problem:   Severe recurrent major depression without psychotic features ([place]) Active Problems:   HTN (hypertension)   Tobacco use disorder   Generalized anxiety disorder   Type 2 diabetes mellitus with hyperglycemia ([place])   Current Medications:  Current Facility-Administered Medications  Medication Dose Route Frequency Provider Last Rate Last Admin  . albuterol (VENTOLIN HFA) 108 (90 Base) MCG/ACT inhaler 2 puff  2 puff Inhalation Q4H PRN Deloria Lair, NP   2 puff at 01/06/21 1724  . alum & mag hydroxide-simeth (MAALOX/MYLANTA) 200-200-20 MG/5ML suspension 30 mL  30 mL Oral Q4H PRN Dixon, Rashaun M, NP      . aspirin EC tablet 81 mg  81 mg Oral Daily Dixon, Rashaun M, NP   81 mg at 01/14/21 0759  . atorvastatin (LIPITOR) tablet 40 mg  40 mg Oral Daily Dixon, Rashaun M, NP   40 mg at 01/14/21 0759  . busPIRone (BUSPAR) tablet 20 mg  20 mg Oral TID Larita Fife, MD   20 mg at 01/14/21 0759  . clonazePAM (KLONOPIN) tablet 0.5 mg  0.5 mg Oral BID PRN Deloria Lair, NP   0.5 mg at 01/13/21 1853  . escitalopram (LEXAPRO) tablet 20 mg  20 mg Oral Daily Dixon, Rashaun M, NP   20 mg at 01/14/21 0759  . hydrochlorothiazide (HYDRODIURIL) tablet 25 mg  25 mg Oral Daily Dixon, Rashaun M, NP   25 mg at 01/14/21 0759  . hydrOXYzine (ATARAX/VISTARIL) tablet 25 mg  25 mg Oral TID PRN Deloria Lair, NP   25 mg at 01/05/21 2113  . ibuprofen (ADVIL) tablet 400 mg  400 mg Oral Q4H PRN Larita Fife, MD   400 mg at 01/13/21 1853  . insulin aspart (novoLOG) injection 0-24 Units  0-24 Units Subcutaneous TID AC & HS [name], MD   2 Units at 01/13/21 1155  . lisinopril (ZESTRIL) tablet 40 mg  40 mg Oral Daily Dixon, Rashaun M, NP   40 mg at 01/14/21  0759  . magnesium hydroxide (MILK OF MAGNESIA) suspension 30 mL  30 mL Oral Daily PRN Deloria Lair, NP      . metFORMIN (GLUCOPHAGE-XR) 24 hr tablet 1,000 mg  1,000 mg Oral BID WC [name], MD   1,000 mg at 01/14/21 0759  . mometasone-formoterol (DULERA) 200-5 MCG/ACT inhaler 2 puff  2 puff Inhalation BID Deloria Lair, NP   2 puff at 01/14/21 0758  . nicotine (NICODERM CQ - dosed in mg/24 hours) patch 21 mg  21 mg Transdermal Daily Larita Fife, MD   21 mg at 01/07/21 0817  . pantoprazole (PROTONIX) EC tablet 40 mg  40 mg Oral Daily Dixon, Rashaun M, NP   40 mg at 01/14/21 0759  . QUEtiapine (SEROQUEL XR) 24 hr tablet 400 mg  400 mg Oral QHS Paliy, Delrae Rend, MD   400 mg at 01/13/21 2128  . zolpidem (AMBIEN) tablet 10 mg  10 mg Oral QHS Deloria Lair, NP   10 mg at 01/13/21 2128   PTA Medications: Medications Prior to Admission  Medication Sig Dispense Refill Last Dose  . ADVAIR DISKUS 500-50 MCG/DOSE AEPB Inhale 1 puff into the lungs 2 (two) times daily.     Marland Kitchen albuterol (VENTOLIN HFA) 108 (90 Base) MCG/ACT inhaler Inhale 2 puffs  into the lungs every 6 (six) hours as needed for wheezing or shortness of breath. 18 g 1   . aspirin 81 MG chewable tablet Chew 1 tablet (81 mg total) by mouth daily. 30 tablet 1   . atorvastatin (LIPITOR) 40 MG tablet Take 1 tablet (40 mg total) by mouth daily. 30 tablet 1   . busPIRone (BUSPAR) 15 MG tablet Take 1 tablet (15 mg total) by mouth 3 (three) times daily. 90 tablet 1   . clonazePAM (KLONOPIN) 0.5 MG tablet Take 1 tablet (0.5 mg total) by mouth 2 (two) times daily as needed (Anxiety). 60 tablet 1   . escitalopram (LEXAPRO) 20 MG tablet Take 1 tablet (20 mg total) by mouth daily. 30 tablet 1   . hydrochlorothiazide (HYDRODIURIL) 25 MG tablet Take 1 tablet (25 mg total) by mouth daily. 30 tablet 1   . lisinopril (ZESTRIL) 40 MG tablet Take 1 tablet (40 mg total) by mouth daily. 30 tablet 1   . metFORMIN (GLUCOPHAGE-XR) 500 MG 24 hr tablet Take 1  tablet (500 mg total) by mouth daily with breakfast. 30 tablet 1   . mometasone-formoterol (DULERA) 200-5 MCG/ACT AERO Inhale 2 puffs into the lungs 2 (two) times daily. 1 each 1   . pantoprazole (PROTONIX) 40 MG tablet Take 1 tablet (40 mg total) by mouth daily. 30 tablet 1   . QUEtiapine (SEROQUEL XR) 400 MG 24 hr tablet Take 1 tablet (400 mg total) by mouth at bedtime. 30 tablet 1   . zolpidem (AMBIEN) 10 MG tablet Take 1 tablet (10 mg total) by mouth at bedtime as needed for sleep. 30 tablet 1     Patient Stressors:    Patient Strengths:    Treatment Modalities: Medication Management, Group therapy, Case management,  1 to 1 session with clinician, Psychoeducation, Recreational therapy.   Physician Treatment Plan for Primary Diagnosis: Severe recurrent major depression without psychotic features (HCC) Long Term Goal(s): Improvement in symptoms so as ready for discharge Improvement in symptoms so as ready for discharge   Short Term Goals: Ability to identify changes in lifestyle to reduce recurrence of condition will improve Ability to verbalize feelings will improve Ability to disclose and discuss suicidal ideas Ability to demonstrate self-control will improve Ability to identify and develop effective coping behaviors will improve Ability to maintain clinical measurements within normal limits will improve Compliance with prescribed medications will improve Ability to identify triggers associated with substance abuse/mental health issues will improve Ability to identify changes in lifestyle to reduce recurrence of condition will improve Ability to verbalize feelings will improve Ability to disclose and discuss suicidal ideas Ability to demonstrate self-control will improve Ability to identify and develop effective coping behaviors will improve Ability to maintain clinical measurements within normal limits will improve Compliance with prescribed medications will improve Ability to  identify triggers associated with substance abuse/mental health issues will improve  Medication Management: Evaluate patient's response, side effects, and tolerance of medication regimen.  Therapeutic Interventions: 1 to 1 sessions, Unit Group sessions and Medication administration.  Evaluation of Outcomes: Progressing  Physician Treatment Plan for Secondary Diagnosis: Principal Problem:   Severe recurrent major depression without psychotic features (HCC) Active Problems:   HTN (hypertension)   Tobacco use disorder   Generalized anxiety disorder   Type 2 diabetes mellitus with hyperglycemia (HCC)  Long Term Goal(s): Improvement in symptoms so as ready for discharge Improvement in symptoms so as ready for discharge   Short Term Goals: Ability to identify changes in  lifestyle to reduce recurrence of condition will improve Ability to verbalize feelings will improve Ability to disclose and discuss suicidal ideas Ability to demonstrate self-control will improve Ability to identify and develop effective coping behaviors will improve Ability to maintain clinical measurements within normal limits will improve Compliance with prescribed medications will improve Ability to identify triggers associated with substance abuse/mental health issues will improve Ability to identify changes in lifestyle to reduce recurrence of condition will improve Ability to verbalize feelings will improve Ability to disclose and discuss suicidal ideas Ability to demonstrate self-control will improve Ability to identify and develop effective coping behaviors will improve Ability to maintain clinical measurements within normal limits will improve Compliance with prescribed medications will improve Ability to identify triggers associated with substance abuse/mental health issues will improve     Medication Management: Evaluate patient's response, side effects, and tolerance of medication regimen.  Therapeutic  Interventions: 1 to 1 sessions, Unit Group sessions and Medication administration.  Evaluation of Outcomes: Progressing   RN Treatment Plan for Primary Diagnosis: Severe recurrent major depression without psychotic features ([place]) Long Term Goal(s): Knowledge of disease and therapeutic regimen to maintain health will improve  Short Term Goals: Ability to verbalize frustration and anger appropriately will improve, Ability to demonstrate self-control, Ability to participate in decision making will improve, Ability to identify and develop effective coping behaviors will improve and Compliance with prescribed medications will improve  Medication Management: RN will administer medications as ordered by provider, will assess and evaluate patient's response and provide education to patient for prescribed medication. RN will report any adverse and/or side effects to prescribing provider.  Therapeutic Interventions: 1 on 1 counseling sessions, Psychoeducation, Medication administration, Evaluate responses to treatment, Monitor vital signs and CBGs as ordered, Perform/monitor CIWA, COWS, AIMS and Fall Risk screenings as ordered, Perform wound care treatments as ordered.  Evaluation of Outcomes: Progressing   LCSW Treatment Plan for Primary Diagnosis: Severe recurrent major depression without psychotic features ([place]) Long Term Goal(s): Safe transition to appropriate next level of care at discharge, Engage patient in therapeutic group addressing interpersonal concerns.  Short Term Goals: Engage patient in aftercare planning with referrals and resources, Increase social support, Increase emotional regulation, Facilitate acceptance of mental health diagnosis and concerns, Identify triggers associated with mental health/substance abuse issues and Increase skills for wellness and recovery  Therapeutic Interventions: Assess for all discharge needs, 1 to 1 time with Social worker, Explore available resources and  support systems, Assess for adequacy in community support network, Educate family and significant other(s) on suicide prevention, Complete Psychosocial Assessment, Interpersonal group therapy.  Evaluation of Outcomes: Progressing   Progress in Treatment: Attending groups: No. Participating in groups: No. Taking medication as prescribed: Yes. Toleration medication: Yes. Family/Significant other contact made: No, will contact:  if given permission. Pt declined collateral/familial contact. Patient understands diagnosis: Yes. Discussing patient identified problems/goals with staff: Yes. Medical problems stabilized or resolved: Yes. Denies suicidal/homicidal ideation: Yes. Issues/concerns per patient self-inventory: No. Other: None.  New problem(s) identified: No, Describe:  none.  New Short Term/Long Term Goal(s): medication management for mood stabilization; elimination of SI thoughts; development of comprehensive mental wellness/sobriety plan. Update 01/14/2021:  No changes at this time.   Patient Goals: "To try to get out of here but make sure it is safe for me to do so." Update 01/14/2021:  No changes at this time.   Discharge Plan or Barriers: Patient plans to discharge back to his home. CSW will assist with discharge/aftercare planning as necessary.  Update 01/14/2021:  Patient reports plans to return to his home.  Patient reports that the flooring is to be fixed 01/14/21.  Reason for Continuation of Hospitalization: Anxiety Suicidal ideation  Estimated Length of Stay: 1-7 days  Attendees: Patient:  01/14/2021 9:44 AM  Physician: Selina Cooley, MD 01/14/2021 9:44 AM  Nursing:  01/14/2021 9:44 AM  RN Care Manager: 01/14/2021 9:44 AM  Social Worker: Assunta Curtis, MSW, LCSW 01/14/2021 9:44 AM  Recreational Therapist:  01/14/2021 9:44 AM  Other: Gwynneth Munson" [place], [place] 01/14/2021 9:44 AM  Other:  01/14/2021 9:44 AM  Other: 01/14/2021 9:44 AM    Scribe for Treatment  Team: Rozann Lesches, LCSW 01/14/2021 9:44 AM

## 2021-01-14 NOTE — Plan of Care (Signed)
Problem: Coping: Goal: Coping ability will improve 01/14/2021 2050 by Butler-Nicholson, Morganna Styles L, LPN Outcome: Progressing 01/14/2021 2044 by Butler-Nicholson, Daneli Butkiewicz L, LPN Outcome: Progressing   Problem: Health Behavior/Discharge Planning: Goal: Identification of resources available to assist in meeting health care needs will improve 01/14/2021 2050 by Butler-Nicholson, Jerrold Haskell L, LPN Outcome: Progressing 01/14/2021 2044 by Butler-Nicholson, Antwanette Wesche L, LPN Outcome: Progressing   Problem: Medication: Goal: Compliance with prescribed medication regimen will improve 01/14/2021 2050 by Butler-Nicholson, Lamica Mccart L, LPN Outcome: Progressing 01/14/2021 2044 by Butler-Nicholson, Kellie Chisolm L, LPN Outcome: Progressing   Problem: Self-Concept: Goal: Ability to disclose and discuss suicidal ideas will improve 01/14/2021 2050 by Butler-Nicholson, Maymie Brunke L, LPN Outcome: Progressing 01/14/2021 2044 by Butler-Nicholson, Kayelyn Lemon L, LPN Outcome: Progressing Goal: Will verbalize positive feelings about self 01/14/2021 2050 by Butler-Nicholson, Luisana Lutzke L, LPN Outcome: Progressing 01/14/2021 2044 by Butler-Nicholson, Rica Koyanagi, LPN Outcome: Progressing   Problem: Education: Goal: Knowledge of [place] Education information/materials will improve 01/14/2021 2050 by Butler-Nicholson, Shayli Altemose L, LPN Outcome: Progressing 01/14/2021 2044 by Butler-Nicholson, Christophor Eick L, LPN Outcome: Progressing Goal: Emotional status will improve 01/14/2021 2050 by Butler-Nicholson, Vedant Shehadeh L, LPN Outcome: Progressing 01/14/2021 2044 by Butler-Nicholson, Kymora Sciara L, LPN Outcome: Progressing Goal: Mental status will improve 01/14/2021 2050 by Butler-Nicholson, Sherrina Zaugg L, LPN Outcome: Progressing 01/14/2021 2044 by Butler-Nicholson, Huma Imhoff L, LPN Outcome: Progressing Goal: Verbalization of understanding the information provided will improve 01/14/2021 2050 by Butler-Nicholson,  Cherika Jessie L, LPN Outcome: Progressing 01/14/2021 2044 by Butler-Nicholson, Alonnie Bieker L, LPN Outcome: Progressing   Problem: Activity: Goal: Interest or engagement in activities will improve 01/14/2021 2050 by Butler-Nicholson, Simmone Cape L, LPN Outcome: Progressing 01/14/2021 2044 by Butler-Nicholson, Marteze Vecchio L, LPN Outcome: Progressing Goal: Sleeping patterns will improve 01/14/2021 2050 by Butler-Nicholson, Trudee Chirino L, LPN Outcome: Progressing 01/14/2021 2044 by Butler-Nicholson, Holston Oyama L, LPN Outcome: Progressing   Problem: Coping: Goal: Ability to verbalize frustrations and anger appropriately will improve 01/14/2021 2050 by Butler-Nicholson, Idolina Mantell L, LPN Outcome: Progressing 01/14/2021 2044 by Butler-Nicholson, Georganna Maxson L, LPN Outcome: Progressing Goal: Ability to demonstrate self-control will improve 01/14/2021 2050 by Butler-Nicholson, Aalaiyah Yassin L, LPN Outcome: Progressing 01/14/2021 2044 by Butler-Nicholson, Caesar Mannella L, LPN Outcome: Progressing   Problem: Health Behavior/Discharge Planning: Goal: Identification of resources available to assist in meeting health care needs will improve 01/14/2021 2050 by Butler-Nicholson, Tulsi Crossett L, LPN Outcome: Progressing 01/14/2021 2044 by Butler-Nicholson, Jahree Dermody L, LPN Outcome: Progressing Goal: Compliance with treatment plan for underlying cause of condition will improve 01/14/2021 2050 by Butler-Nicholson, Bryley Chrisman L, LPN Outcome: Progressing 01/14/2021 2044 by Butler-Nicholson, Joshawn Crissman L, LPN Outcome: Progressing   Problem: Physical Regulation: Goal: Ability to maintain clinical measurements within normal limits will improve 01/14/2021 2050 by Butler-Nicholson, Shantel Helwig L, LPN Outcome: Progressing 01/14/2021 2044 by Butler-Nicholson, Lygia Olaes L, LPN Outcome: Progressing   Problem: Safety: Goal: Periods of time without injury will increase 01/14/2021 2050 by Butler-Nicholson, Rosaland Shiffman L, LPN Outcome:  Progressing 01/14/2021 2044 by Butler-Nicholson, Mariza Bourget L, LPN Outcome: Progressing   Problem: Education: Goal: Ability to make informed decisions regarding treatment will improve 01/14/2021 2050 by Butler-Nicholson, Dafna Romo L, LPN Outcome: Progressing 01/14/2021 2044 by Butler-Nicholson, Kayanna Mckillop L, LPN Outcome: Progressing   Problem: Coping: Goal: Coping ability will improve 01/14/2021 2050 by Butler-Nicholson, Gedeon Brandow L, LPN Outcome: Progressing 01/14/2021 2044 by Butler-Nicholson, Neev Mcmains L, LPN Outcome: Progressing   Problem: Health Behavior/Discharge Planning: Goal: Identification of resources available to assist in meeting health care needs will improve 01/14/2021 2050 by Butler-Nicholson, Caillou Minus L, LPN Outcome: Progressing 01/14/2021 2044 by Butler-Nicholson, Georgana Romain L, LPN Outcome: Progressing   Problem: Medication:  Goal: Compliance with prescribed medication regimen will improve 01/14/2021 2050 by Butler-Nicholson, Yarethzy Croak L, LPN Outcome: Progressing 01/14/2021 2044 by Butler-Nicholson, Yona Kosek L, LPN Outcome: Progressing   Problem: Self-Concept: Goal: Ability to disclose and discuss suicidal ideas will improve 01/14/2021 2050 by Butler-Nicholson, Roniya Tetro L, LPN Outcome: Progressing 01/14/2021 2044 by Butler-Nicholson, Jerney Baksh L, LPN Outcome: Progressing Goal: Will verbalize positive feelings about self 01/14/2021 2050 by Butler-Nicholson, Bana Borgmeyer L, LPN Outcome: Progressing 01/14/2021 2044 by Butler-Nicholson, Arnetra Terris L, LPN Outcome: Progressing

## 2021-01-14 NOTE — Plan of Care (Signed)
  Problem: Coping: Goal: Coping ability will improve Outcome: Progressing   Problem: Health Behavior/Discharge Planning: Goal: Identification of resources available to assist in meeting health care needs will improve Outcome: Progressing   Problem: Medication: Goal: Compliance with prescribed medication regimen will improve Outcome: Progressing   Problem: Self-Concept: Goal: Ability to disclose and discuss suicidal ideas will improve Outcome: Progressing Goal: Will verbalize positive feelings about self Outcome: Progressing   Problem: Education: Goal: Knowledge of [facility] Education information/materials will improve Outcome: Progressing Goal: Emotional status will improve Outcome: Progressing Goal: Mental status will improve Outcome: Progressing Goal: Verbalization of understanding the information provided will improve Outcome: Progressing   Problem: Activity: Goal: Interest or engagement in activities will improve Outcome: Progressing Goal: Sleeping patterns will improve Outcome: Progressing   Problem: Coping: Goal: Ability to verbalize frustrations and anger appropriately will improve Outcome: Progressing Goal: Ability to demonstrate self-control will improve Outcome: Progressing   Problem: Health Behavior/Discharge Planning: Goal: Identification of resources available to assist in meeting health care needs will improve Outcome: Progressing Goal: Compliance with treatment plan for underlying cause of condition will improve Outcome: Progressing   Problem: Physical Regulation: Goal: Ability to maintain clinical measurements within normal limits will improve Outcome: Progressing   Problem: Safety: Goal: Periods of time without injury will increase Outcome: Progressing   Problem: Education: Goal: Ability to make informed decisions regarding treatment will improve Outcome: Progressing   Problem: Coping: Goal: Coping ability will improve Outcome:  Progressing   Problem: Health Behavior/Discharge Planning: Goal: Identification of resources available to assist in meeting health care needs will improve Outcome: Progressing   Problem: Medication: Goal: Compliance with prescribed medication regimen will improve Outcome: Progressing   Problem: Self-Concept: Goal: Ability to disclose and discuss suicidal ideas will improve Outcome: Progressing Goal: Will verbalize positive feelings about self Outcome: Progressing   

## 2021-01-14 NOTE — [facility] Group Notes (Signed)
LCSW Group Therapy Note   01/14/2021 1:46 PM  Type of Therapy and Topic:  Group Therapy:  Overcoming Obstacles   Participation Level:  Did Not Attend   Description of Group:    In this group patients will be encouraged to explore what they see as obstacles to their own wellness and recovery. They will be guided to discuss their thoughts, feelings, and behaviors related to these obstacles. The group will process together ways to cope with barriers, with attention given to specific choices patients can make. Each patient will be challenged to identify changes they are motivated to make in order to overcome their obstacles. This group will be process-oriented, with patients participating in exploration of their own experiences as well as giving and receiving support and challenge from other group members.   Therapeutic Goals: 1. Patient will identify personal and current obstacles as they relate to admission. 2. Patient will identify barriers that currently interfere with their wellness or overcoming obstacles.  3. Patient will identify feelings, thought process and behaviors related to these barriers. 4. Patient will identify two changes they are willing to make to overcome these obstacles:      Summary of Patient Progress X   Therapeutic Modalities:   Cognitive Behavioral Therapy Solution Focused Therapy Motivational Interviewing Relapse Prevention Therapy  Assunta Curtis, MSW, LCSW 01/14/2021 1:46 PM

## 2021-01-15 DIAGNOSIS — F332 Major depressive disorder, recurrent severe without psychotic features: Secondary | ICD-10-CM | POA: Diagnosis not present

## 2021-01-15 LAB — GLUCOSE, CAPILLARY: Glucose-Capillary: 112 mg/dL — ABNORMAL HIGH (ref 70–99)

## 2021-01-15 MED ORDER — ESCITALOPRAM OXALATE 20 MG PO TABS: 20.0000 mg | ORAL_TABLET | Freq: Every day | ORAL | 1 refills | Status: DC

## 2021-01-15 MED ORDER — CLONAZEPAM 0.5 MG PO TABS: 0.5000 mg | ORAL_TABLET | Freq: Two times a day (BID) | ORAL | 1 refills | Status: DC | PRN

## 2021-01-15 MED ORDER — ZOLPIDEM TARTRATE 10 MG PO TABS: 10.0000 mg | ORAL_TABLET | Freq: Every evening | ORAL | 1 refills | Status: DC | PRN

## 2021-01-15 MED ORDER — HYDROXYZINE HCL 25 MG PO TABS: 25.0000 mg | ORAL_TABLET | Freq: Three times a day (TID) | ORAL | 1 refills | Status: DC | PRN

## 2021-01-15 MED ORDER — PANTOPRAZOLE SODIUM 40 MG PO TBEC: 40.0000 mg | DELAYED_RELEASE_TABLET | Freq: Every day | ORAL | 1 refills | Status: DC

## 2021-01-15 MED ORDER — METFORMIN HCL ER (OSM) 1000 MG PO TB24: 1000.0000 mg | ORAL_TABLET | Freq: Two times a day (BID) | ORAL | 1 refills | Status: DC

## 2021-01-15 MED ORDER — LISINOPRIL 40 MG PO TABS: 40.0000 mg | ORAL_TABLET | Freq: Every day | ORAL | 1 refills | Status: DC

## 2021-01-15 MED ORDER — HYDROCHLOROTHIAZIDE 25 MG PO TABS: 25.0000 mg | ORAL_TABLET | Freq: Every day | ORAL | 1 refills | Status: DC

## 2021-01-15 MED ORDER — BUSPIRONE HCL 10 MG PO TABS: 20.0000 mg | ORAL_TABLET | Freq: Three times a day (TID) | ORAL | 1 refills | Status: DC

## 2021-01-15 MED ORDER — QUETIAPINE FUMARATE ER 400 MG PO TB24: 400.0000 mg | ORAL_TABLET | Freq: Every day | ORAL | 1 refills | Status: DC

## 2021-01-15 MED ORDER — ATORVASTATIN CALCIUM 40 MG PO TABS: 40.0000 mg | ORAL_TABLET | Freq: Every day | ORAL | 1 refills | Status: AC

## 2021-01-15 NOTE — [facility] Counselor (Signed)
CSW completed Guardianship tab, however, notes that patient is his OWN guardian.    Assunta Curtis, MSW, LCSW 01/15/2021 11:22 AM

## 2021-01-15 NOTE — Progress Notes (Signed)
Recreation Therapy Notes  INPATIENT RECREATION TR PLAN  Patient Details Name: Ian [name] MRN: [id number] DOB: 09/30/1961 Today's Date: 01/15/2021  Rec Therapy Plan Is patient appropriate for Therapeutic Recreation?: Yes Treatment times per week: at least 3 Estimated Length of Stay: 5-7 days TR Treatment/Interventions: Group participation (Comment)  Discharge Criteria Pt will be discharged from therapy if:: Discharged Treatment plan/goals/alternatives discussed and agreed upon by:: Patient/family  Discharge Summary Short term goals set: Patient will engage in groups without prompting or encouragement from LRT x3 group sessions within 5 recreation therapy group sessions Short term goals met: Not met Reason goals not met: Patient did not attend any groups Therapeutic equipment acquired: N/A Reason patient discharged from therapy: Discharge from hospital Pt/family agrees with progress & goals achieved: Yes Date patient discharged from therapy: 01/15/21   Clatie Kessen 01/15/2021, 12:03 PM

## 2021-01-15 NOTE — Progress Notes (Signed)
Patient denies SI/HI, denies A/V hallucinations. Patient verbalizes understanding of discharge instructions, follow up care and prescriptions. Patient given all belongings from [facility] locker. Patient escorted out by staff, transported by friend. 

## 2021-01-15 NOTE — Progress Notes (Signed)
Patient is pleasant and easy to engage. He is alert and oriented with clear and logical thought and speech. He is med compliant and tolerated his medication without incident. His CBG was 100 this evening so no insulin coverage was needed.  He denied SI  HI  AVH depression and pain at this encounter. He does endorse anxiety and rates his anxiety at 8/10 says his leaving tomorrow has him on edge. He was receptive to information regarding [facility] and following up with them for free diabetes supplies and medical treatment.          Cleo Butler-Nicholson, LPN

## 2021-01-15 NOTE — BHH Suicide Risk Assessment (Signed)
[facility] Discharge Suicide Risk Assessment   Principal Problem: Severe recurrent major depression without psychotic features [facility] ) Discharge Diagnoses: Principal Problem:   Severe recurrent major depression without psychotic features ([place]) Active Problems:   HTN (hypertension)   Tobacco use disorder   Generalized anxiety disorder   Type 2 diabetes mellitus with hyperglycemia ([place])   Total Time spent with patient: 30 minutes  Musculoskeletal: Strength & Muscle Tone: within normal limits Gait & Station: normal Patient leans: N/A  Psychiatric Specialty Exam: Review of Systems  Constitutional: Negative for appetite change and fatigue.  HENT: Negative for rhinorrhea and sore throat.   Eyes: Negative for pain and discharge.  Respiratory: Negative for cough and shortness of breath.   Cardiovascular: Negative for chest pain and palpitations.  Gastrointestinal: Negative for constipation, diarrhea, nausea and vomiting.  Endocrine: Negative for cold intolerance and heat intolerance.  Genitourinary: Negative for difficulty urinating and enuresis.  Musculoskeletal: Negative for arthralgias and myalgias.  Skin: Negative for rash and wound.  Allergic/Immunologic: Negative for food allergies and immunocompromised state.  Neurological: Negative for dizziness and headaches.  Hematological: Negative for adenopathy. Does not bruise/bleed easily.  Psychiatric/Behavioral: Negative for hallucinations and suicidal ideas.    Blood pressure 115/74, pulse 87, temperature 98 F (36.7 C), temperature source Oral, resp. rate 18, height 6' (1.829 m), weight 131.5 kg, SpO2 95 %.Body mass index is 39.33 kg/m.  General Appearance: Fairly Groomed  Engineer, water::  Good  Speech:  Clear and Coherent and Normal Rate  Volume:  Normal  Mood:  Euthymic  Affect:  Congruent  Thought Process:  Coherent and Linear  Orientation:  Full (Time, Place, and Person)  Thought Content:  Logical  Suicidal Thoughts:  No   Homicidal Thoughts:  No  Memory:  Immediate;   Fair Recent;   Fair Remote;   Fair  Judgement:  Fair  Insight:  Fair  Psychomotor Activity:  Normal  Concentration:  Fair  Recall:  [employer] of Knowledge:Fair  Language: Fair  Akathisia:  Negative  Handed:  Right  AIMS (if indicated):     Assets:  Communication Skills Desire for Improvement Financial Resources/Insurance Housing Resilience Social Support  Sleep:  Number of Hours: 7.5  Cognition: WNL  ADL's:  Intact   Mental Status Per Nursing Assessment::   On Admission:  Suicidal ideation indicated by patient  Demographic Factors:  Male, Caucasian and Living alone  Loss Factors: NA  Historical Factors: Prior suicide attempts  Risk Reduction Factors:   Religious beliefs about death, Positive social support, Positive therapeutic relationship and Positive coping skills or problem solving skills  Continued Clinical Symptoms:  Severe Anxiety and/or Agitation Depression:   Recent sense of peace/wellbeing Previous Psychiatric Diagnoses and Treatments Medical Diagnoses and Treatments/Surgeries  Cognitive Features That Contribute To Risk:  None    Suicide Risk:  Mild:  Suicidal ideation of limited frequency, intensity, duration, and specificity.  There are no identifiable plans, no associated intent, mild dysphoria and related symptoms, good self-control (both objective and subjective assessment), few other risk factors, and identifiable protective factors, including available and accessible social support.   Follow-up Information    [facility] Follow up on 01/31/2021.   Why: Appointment with psychiatrist is scheduled 01/31/2021 at 1:40PM. Thanks! Contact information: [place] [address] [phone]               Plan Of Care/Follow-up recommendations:  Activity:  as tolerated Diet:  carb modified, diabetic diet  Jahziel Sinn M  Domingo Cocking, MD 01/15/2021, 10:21 AM

## 2021-01-15 NOTE — Discharge Summary (Signed)
Physician Discharge Summary Note  Patient:  Ian [name] is an 59 y.o., male MRN:  [id number] DOB:  [birth year] Patient phone:  [phone] (home)  Patient address:   [address] [place] [address] [place] [address],  Total Time spent with patient: 30 minutes  Date of Admission:  01/04/2021 Date of Discharge: 01/15/2021  Reason for Admission:  [name] a 59y.o. male that has a previous psychiatric history ofdepressionwho presents to the [facility] unit for treatment of depression and suicidal ideations.  Principal Problem: Severe recurrent major depression without psychotic features [facility]) Discharge Diagnoses: Principal Problem:   Severe recurrent major depression without psychotic features ([place]) Active Problems:   HTN (hypertension)   Tobacco use disorder   Generalized anxiety disorder   Type 2 diabetes mellitus with hyperglycemia ([place])   Past Psychiatric History: Long history of anxiety and depression and mood swings. Back when he was drinking heavily he would have more extreme and erratic behavior but even since he has gotten sober for an extended period his depression and anxiety with limited coping skills have continued. Positive past suicide attempts.  Past Medical History:  Past Medical History:  Diagnosis Date  . Anxiety   . Barrett esophagus   . Cancer ([place])   . Coronary artery disease   . Depression   . GERD (gastroesophageal reflux disease)   . Hypertension   . Pre-diabetes   . Stroke [facility]) 2014   "mini-stroke" per patient    Past Surgical History:  Procedure Laterality Date  . ANGIOPLASTY    . APPENDECTOMY    . CARDIAC CATHETERIZATION    . CHOLECYSTECTOMY    . WRIST SURGERY Right    age 1   Family History:  Family History  Problem Relation Age of Onset  . Stroke Father   . Leukemia Father   . Breast cancer Sister    Family Psychiatric  History: Denies Social History:  Social History   Substance and Sexual Activity  Alcohol Use Not Currently      Social History   Substance and Sexual Activity  Drug Use Yes  . Types: Cocaine, Marijuana    Social History   Socioeconomic History  . Marital status: Divorced    Spouse name: Not on file  . Number of children: Not on file  . Years of education: Not on file  . Highest education level: Not on file  Occupational History  . Not on file  Tobacco Use  . Smoking status: Current Every Day Smoker    Packs/day: 1.00    Types: Cigarettes  . Smokeless tobacco: Never Used  Vaping Use  . Vaping Use: Never used  Substance and Sexual Activity  . Alcohol use: Not Currently  . Drug use: Yes    Types: Cocaine, Marijuana  . Sexual activity: Not on file  Other Topics Concern  . Not on file  Social History Narrative  . Not on file   Social Determinants of Health   Financial Resource Strain: Not on file  Food Insecurity: Not on file  Transportation Needs: Not on file  Physical Activity: Not on file  Stress: Not on file  Social Connections: Not on file    Hospital Course:  [name] a 59y.o. male that has a previous psychiatric history ofdepressionwho presents to the [facility] unit for treatment of depression and suicidal ideations. We continued his home medications, and increased his buspar to 20 mg TID. His blood glucose levels were also noted to be elevated. He initially required correctional Novolog  with meals and at bedtime. Metformin increased to 1000 mg BID, and his glucose levels were within target range without any insulin. He was counseled on diabetic diet [facility]. At time of discharge he denied suicidal ideations, homicidal ideations, visual hallucinations, or auditory hallucinations. Patient and treatment team felt he was safe to discharge home with close outpatient follow-up.   Physical Findings: AIMS: Facial and Oral Movements Muscles of Facial Expression: None, normal Lips and Perioral Area: None, normal Jaw: None, normal Tongue: None,  normal,Extremity Movements Upper (arms, wrists, hands, fingers): None, normal Lower (legs, knees, ankles, toes): None, normal, Trunk Movements Neck, shoulders, hips: None, normal, Overall Severity Severity of abnormal movements (highest score from questions above): None, normal Incapacitation due to abnormal movements: None, normal Patient's awareness of abnormal movements (rate only patient's report): No Awareness, Dental Status Current problems with teeth and/or dentures?: No Does patient usually wear dentures?: No  CIWA:    COWS:     Musculoskeletal: Strength & Muscle Tone: within normal limits Gait & Station: normal Patient leans: N/A  Psychiatric Specialty Exam: Physical Exam Vitals and nursing note reviewed.  Constitutional:      Appearance: Normal appearance.  HENT:     Head: Normocephalic and atraumatic.     Right Ear: External ear normal.     Left Ear: External ear normal.     Nose: Nose normal.     Mouth/Throat:     Mouth: Mucous membranes are moist.     Pharynx: Oropharynx is clear.  Eyes:     Extraocular Movements: Extraocular movements intact.     Conjunctiva/sclera: Conjunctivae normal.     Pupils: Pupils are equal, round, and reactive to light.  Cardiovascular:     Rate and Rhythm: Normal rate.     Pulses: Normal pulses.  Pulmonary:     Effort: Pulmonary effort is normal.     Breath sounds: Normal breath sounds.  Abdominal:     General: Abdomen is flat.     Palpations: Abdomen is soft.  Musculoskeletal:        General: No swelling. Normal range of motion.     Cervical back: Normal range of motion and neck supple.  Skin:    General: Skin is warm and dry.  Neurological:     General: No focal deficit present.     Mental Status: He is alert and oriented to person, place, and time.  Psychiatric:        Mood and Affect: Mood normal.        Behavior: Behavior normal.        Thought Content: Thought content normal.        Judgment: Judgment normal.          Review of Systems  Constitutional: Negative for appetite change and fatigue.  HENT: Negative for rhinorrhea and sore throat.   Eyes: Negative for pain and discharge.  Respiratory: Negative for cough and shortness of breath.   Cardiovascular: Negative for chest pain and palpitations.  Gastrointestinal: Negative for constipation, diarrhea, nausea and vomiting.  Endocrine: Negative for cold intolerance and heat intolerance.  Genitourinary: Negative for difficulty urinating and enuresis.  Musculoskeletal: Negative for arthralgias and myalgias.  Skin: Negative for rash and wound.  Allergic/Immunologic: Negative for food allergies and immunocompromised state.  Neurological: Negative for dizziness and headaches.  Hematological: Negative for adenopathy. Does not bruise/bleed easily.  Psychiatric/Behavioral: Negative for hallucinations and suicidal ideas.    Blood pressure 115/74, pulse 87,  temperature 98 F (36.7 C), temperature source Oral, resp. rate 18, height 6' (1.829 m), weight 131.5 kg, SpO2 95 %.Body mass index is 39.33 kg/m.  General Appearance: Fairly Groomed  Engineer, water::  Good  Speech:  Clear and Coherent and Normal Rate  Volume:  Normal  Mood:  Euthymic  Affect:  Congruent  Thought Process:  Coherent and Linear  Orientation:  Full (Time, Place, and Person)  Thought Content:  Logical  Suicidal Thoughts:  No  Homicidal Thoughts:  No  Memory:  Immediate;   Fair Recent;   Fair Remote;   Fair  Judgement:  Fair  Insight:  Fair  Psychomotor Activity:  Normal  Concentration:  Fair  Recall:  [place]  Language: Fair  Akathisia:  Negative  Handed:  Right  AIMS (if indicated):     Assets:  Communication Skills Desire for Improvement Financial Resources/Insurance Housing Resilience Social Support  Sleep:  Number of Hours: 7.5  Cognition: WNL  ADL's:  Intact        Have you used any form of tobacco in the last 30 days? (Cigarettes,  Smokeless Tobacco, Cigars, and/or Pipes): Yes  Has this patient used any form of tobacco in the last 30 days? (Cigarettes, Smokeless Tobacco, Cigars, and/or Pipes)  No   Blood Alcohol level:  Lab Results  Component Value Date   ETH <10 01/04/2021   ETH 137 (H) [id number]    Metabolic Disorder Labs:  Lab Results  Component Value Date   HGBA1C 7.4 (H) 01/04/2021   MPG 165.68 01/04/2021   MPG 165.68 08/26/2019   No results found for: PROLACTIN Lab Results  Component Value Date   CHOL 178 08/26/2019   TRIG 375 (H) 08/26/2019   HDL 23 (L) 08/26/2019   CHOLHDL 7.7 08/26/2019   VLDL 75 (H) 08/26/2019   LDLCALC 80 08/26/2019   LDLCALC 70 12/13/2018    See Psychiatric Specialty Exam and Suicide Risk Assessment completed by Attending Physician prior to discharge.  Discharge destination:  Home  Is patient on multiple antipsychotic therapies at discharge:  No   Has Patient had three or more failed trials of antipsychotic monotherapy by history:  No  Recommended Plan for Multiple Antipsychotic Therapies: NA  Discharge Instructions    Diet Carb Modified   Complete by: As directed    Increase activity slowly   Complete by: As directed      Allergies as of 01/15/2021      Reactions   Fluoxetine Swelling   Anxiety, itching   Hydrocodone-acetaminophen Itching   Mirtazapine Other (See Comments)   nightmares   Tramadol Other (See Comments)   Gabapentin Other (See Comments)   Nightmares      Medication List    TAKE these medications     Indication  Advair Diskus 500-50 MCG/DOSE Aepb Generic drug: Fluticasone-Salmeterol Inhale 1 puff into the lungs 2 (two) times daily.  Indication: Asthma   albuterol 108 (90 Base) MCG/ACT inhaler Commonly known as: VENTOLIN HFA Inhale 2 puffs into the lungs every 6 (six) hours as needed for wheezing or shortness of breath.  Indication: Asthma   aspirin 81 MG chewable tablet Chew 1 tablet (81 mg total) by mouth daily.  Indication:  Inflammation, Stable Angina Pectoris   atorvastatin 40 MG tablet Commonly known as: LIPITOR Take 1 tablet (40 mg total) by mouth daily.  Indication: High Amount of Fats in the Blood   busPIRone 10 MG tablet Commonly known as: BUSPAR Take 2 tablets (  20 mg total) by mouth 3 (three) times daily. What changed:   medication strength  how much to take  Indication: Anxiety Disorder   clonazePAM 0.5 MG tablet Commonly known as: KLONOPIN Take 1 tablet (0.5 mg total) by mouth 2 (two) times daily as needed (Anxiety).  Indication: Feeling Anxious   escitalopram 20 MG tablet Commonly known as: LEXAPRO Take 1 tablet (20 mg total) by mouth daily.  Indication: Generalized Anxiety Disorder, Major Depressive Disorder   hydrochlorothiazide 25 MG tablet Commonly known as: HYDRODIURIL Take 1 tablet (25 mg total) by mouth daily.  Indication: High Blood Pressure Disorder   hydrOXYzine 25 MG tablet Commonly known as: ATARAX/VISTARIL Take 1 tablet (25 mg total) by mouth 3 (three) times daily as needed for anxiety.  Indication: Feeling Anxious   lisinopril 40 MG tablet Commonly known as: ZESTRIL Take 1 tablet (40 mg total) by mouth daily.  Indication: High Blood Pressure Disorder   metformin 1000 MG (OSM) 24 hr tablet Commonly known as: FORTAMET Take 1 tablet (1,000 mg total) by mouth 2 (two) times daily with a meal. What changed:   medication strength  how much to take  when to take this  Indication: Type 2 Diabetes   mometasone-formoterol 200-5 MCG/ACT Aero Commonly known as: DULERA Inhale 2 puffs into the lungs 2 (two) times daily.  Indication: Asthma   pantoprazole 40 MG tablet Commonly known as: PROTONIX Take 1 tablet (40 mg total) by mouth daily.  Indication: Inhalation of Foreign Material Into the Lungs   QUEtiapine 400 MG 24 hr tablet Commonly known as: SEROQUEL XR Take 1 tablet (400 mg total) by mouth at bedtime.  Indication: Major Depressive Disorder   zolpidem 10  MG tablet Commonly known as: AMBIEN Take 1 tablet (10 mg total) by mouth at bedtime as needed for sleep.  Indication: [place] Follow up on 01/31/2021.   Why: Appointment with psychiatrist is scheduled 01/31/2021 at 1:40PM. Thanks! Contact information: [place] [address] [phone]               Follow-up recommendations:  Activity:  as tolerated Diet:  carb modified diabetic diet  Comments:  30-day scripts with one refill sent to [facility] in [place], [place] per patient request  Signed: [name], MD 01/15/2021, 10:27 AM

## 2021-01-15 NOTE — Progress Notes (Signed)
Recreation Therapy Notes    Date: 01/15/2021  Time: 9:30 am   Location: Craft room     Behavioral response: N/A   Intervention Topic: Stress   Discussion/Intervention: Patient did not attend group.   Clinical Observations/Feedback:  Patient did not attend group.   Chelesea Weiand LRT/CTRS        Camilo Mander 01/15/2021 11:44 AM

## 2021-01-15 NOTE — Progress Notes (Signed)
  [facility] Adult Case Management Discharge Plan :  Will you be returning to the same living situation after discharge:  Yes,  pt reports that he is returning home. At discharge, do you have transportation home?: Yes,  pt reports peer will provide transportation. Do you have the ability to pay for your medications: Yes,  Medicaid  Release of information consent forms completed and in the chart;  Patient's signature needed at discharge.  Patient to Follow up at:  Follow-up Information    [facility] Follow up on 01/31/2021.   Why: Appointment with psychiatrist is scheduled 01/31/2021 at 1:40PM. Thanks! Contact information: [place] [address] [phone]               Next level of care provider has access to [place] and Suicide Prevention discussed: Yes,  SPE completed with patient.   Have you used any form of tobacco in the last 30 days? (Cigarettes, Smokeless Tobacco, Cigars, and/or Pipes): Yes  Has patient been referred to the Quitline?: Patient refused referral  Patient has been referred for addiction treatment: Pt. refused referral  Ian Lesches, LCSW 01/15/2021, 9:20 AM

## 2021-06-06 ENCOUNTER — Emergency Department: Payer: No Typology Code available for payment source

## 2021-06-06 ENCOUNTER — Emergency Department (EMERGENCY_DEPARTMENT_HOSPITAL)
Admission: EM | Admit: 2021-06-06 | Discharge: 2021-06-07 | Disposition: A | Discharge status: Other Acute Inpatient Hospital | Payer: No Typology Code available for payment source | Source: Home / Self Care | Attending: Emergency Medicine | Admitting: Emergency Medicine

## 2021-06-06 ENCOUNTER — Other Ambulatory Visit: Payer: Self-pay

## 2021-06-06 DIAGNOSIS — Z8546 Personal history of malignant neoplasm of prostate: Secondary | ICD-10-CM | POA: Insufficient documentation

## 2021-06-06 DIAGNOSIS — R45851 Suicidal ideations: Secondary | ICD-10-CM | POA: Insufficient documentation

## 2021-06-06 DIAGNOSIS — F339 Major depressive disorder, recurrent, unspecified: Secondary | ICD-10-CM | POA: Insufficient documentation

## 2021-06-06 DIAGNOSIS — I10 Essential (primary) hypertension: Secondary | ICD-10-CM | POA: Insufficient documentation

## 2021-06-06 DIAGNOSIS — F17210 Nicotine dependence, cigarettes, uncomplicated: Secondary | ICD-10-CM | POA: Insufficient documentation

## 2021-06-06 DIAGNOSIS — Z20822 Contact with and (suspected) exposure to covid-19: Secondary | ICD-10-CM | POA: Insufficient documentation

## 2021-06-06 DIAGNOSIS — F332 Major depressive disorder, recurrent severe without psychotic features: Secondary | ICD-10-CM | POA: Diagnosis present

## 2021-06-06 DIAGNOSIS — E119 Type 2 diabetes mellitus without complications: Secondary | ICD-10-CM | POA: Insufficient documentation

## 2021-06-06 DIAGNOSIS — E1165 Type 2 diabetes mellitus with hyperglycemia: Secondary | ICD-10-CM | POA: Diagnosis present

## 2021-06-06 DIAGNOSIS — I2510 Atherosclerotic heart disease of native coronary artery without angina pectoris: Secondary | ICD-10-CM | POA: Insufficient documentation

## 2021-06-06 DIAGNOSIS — Z79899 Other long term (current) drug therapy: Secondary | ICD-10-CM | POA: Insufficient documentation

## 2021-06-06 DIAGNOSIS — F32A Depression, unspecified: Secondary | ICD-10-CM | POA: Diagnosis present

## 2021-06-06 DIAGNOSIS — J441 Chronic obstructive pulmonary disease with (acute) exacerbation: Secondary | ICD-10-CM | POA: Diagnosis present

## 2021-06-06 DIAGNOSIS — R52 Pain, unspecified: Secondary | ICD-10-CM

## 2021-06-06 DIAGNOSIS — Z7984 Long term (current) use of oral hypoglycemic drugs: Secondary | ICD-10-CM | POA: Insufficient documentation

## 2021-06-06 DIAGNOSIS — F411 Generalized anxiety disorder: Secondary | ICD-10-CM | POA: Diagnosis present

## 2021-06-06 LAB — CBC
HCT: 56.5 % — ABNORMAL HIGH (ref 39.0–52.0)
Hemoglobin: 17.8 g/dL — ABNORMAL HIGH (ref 13.0–17.0)
MCH: 27.4 pg (ref 26.0–34.0)
MCHC: 31.5 g/dL (ref 30.0–36.0)
MCV: 86.9 fL (ref 80.0–100.0)
Platelets: 237 10*3/uL (ref 150–400)
RBC: 6.5 MIL/uL — ABNORMAL HIGH (ref 4.22–5.81)
RDW: 13.1 % (ref 11.5–15.5)
WBC: 14.6 10*3/uL — ABNORMAL HIGH (ref 4.0–10.5)
nRBC: 0 % (ref 0.0–0.2)

## 2021-06-06 LAB — SALICYLATE LEVEL: Salicylate Lvl: <7 mg/dL — ABNORMAL LOW (ref 7.0–30.0)

## 2021-06-06 LAB — BASIC METABOLIC PANEL
Anion gap: 11 (ref 5–15)
BUN: 21 mg/dL — ABNORMAL HIGH (ref 6–20)
CO2: 23 mmol/L (ref 22–32)
Calcium: 9.4 mg/dL (ref 8.9–10.3)
Chloride: 103 mmol/L (ref 98–111)
Creatinine, Ser: 1.01 mg/dL (ref 0.61–1.24)
GFR, Estimated: >60 mL/min (ref >60)
Glucose, Bld: 158 mg/dL — ABNORMAL HIGH (ref 70–99)
Potassium: 4.5 mmol/L (ref 3.5–5.1)
Sodium: 137 mmol/L (ref 135–145)

## 2021-06-06 LAB — RESP PANEL BY RT-PCR (FLU A&B, COVID) ARPGX2
Influenza A by PCR: NEGATIVE
Influenza B by PCR: NEGATIVE
SARS Coronavirus 2 by RT PCR: NEGATIVE

## 2021-06-06 LAB — URINE DRUG SCREEN, QUALITATIVE (ARMC ONLY)
Amphetamines, Ur Screen: NOT DETECTED
Barbiturates, Ur Screen: NOT DETECTED
Benzodiazepine, Ur Scrn: NOT DETECTED
Cannabinoid 50 Ng, Ur ~~LOC~~: NOT DETECTED
Cocaine Metabolite,Ur ~~LOC~~: NOT DETECTED
MDMA (Ecstasy)Ur Screen: NOT DETECTED
Methadone Scn, Ur: NOT DETECTED
Opiate, Ur Screen: NOT DETECTED
Phencyclidine (PCP) Ur S: NOT DETECTED
Tricyclic, Ur Screen: POSITIVE — AB

## 2021-06-06 LAB — TROPONIN I (HIGH SENSITIVITY)
Troponin I (High Sensitivity): 4 ng/L (ref <18)
Troponin I (High Sensitivity): 4 ng/L (ref <18)

## 2021-06-06 LAB — ACETAMINOPHEN LEVEL: Acetaminophen (Tylenol), Serum: <10 ug/mL — ABNORMAL LOW (ref 10–30)

## 2021-06-06 LAB — ETHANOL: Alcohol, Ethyl (B): <10 mg/dL (ref <10)

## 2021-06-06 MED ORDER — LISINOPRIL 20 MG PO TABS
40.0000 mg | ORAL_TABLET | Freq: Every day | ORAL | Status: DC
  Administered 2021-06-07: 40 mg via ORAL

## 2021-06-06 MED ORDER — CLONAZEPAM 0.5 MG PO TABS: 0.5000 mg | ORAL_TABLET | Freq: Two times a day (BID) | ORAL | Status: DC

## 2021-06-06 MED ORDER — CLONAZEPAM 0.5 MG PO TABS
0.5000 mg | ORAL_TABLET | Freq: Two times a day (BID) | ORAL | Status: DC | PRN
  Administered 2021-06-06: 0.5 mg via ORAL
  Filled 2021-06-06: qty 1

## 2021-06-06 MED ORDER — ZOLPIDEM TARTRATE 5 MG PO TABS
10.0000 mg | ORAL_TABLET | Freq: Every evening | ORAL | Status: DC | PRN
  Administered 2021-06-06: 10 mg via ORAL
  Filled 2021-06-06: qty 2

## 2021-06-06 MED ORDER — CLONIDINE HCL 0.1 MG PO TABS
0.1000 mg | ORAL_TABLET | Freq: Once | ORAL | Status: AC
  Administered 2021-06-06: 0.1 mg via ORAL
  Filled 2021-06-06: qty 1

## 2021-06-06 MED ORDER — NICOTINE 21 MG/24HR TD PT24
21.0000 mg | MEDICATED_PATCH | Freq: Every day | TRANSDERMAL | Status: DC
  Administered 2021-06-06 – 2021-06-07 (×2): 21 mg via TRANSDERMAL
  Filled 2021-06-06 (×2): qty 1

## 2021-06-06 MED ORDER — QUETIAPINE FUMARATE ER 200 MG PO TB24
400.0000 mg | ORAL_TABLET | Freq: Every day | ORAL | Status: DC
  Administered 2021-06-06: 400 mg via ORAL
  Filled 2021-06-06 (×3): qty 2

## 2021-06-06 MED ORDER — ESCITALOPRAM OXALATE 10 MG PO TABS
20.0000 mg | ORAL_TABLET | Freq: Every day | ORAL | Status: DC
  Administered 2021-06-07: 20 mg via ORAL
  Filled 2021-06-06: qty 2

## 2021-06-06 MED ORDER — FAMOTIDINE 20 MG PO TABS
40.0000 mg | ORAL_TABLET | Freq: Once | ORAL | Status: AC
  Administered 2021-06-06: 40 mg via ORAL
  Filled 2021-06-06: qty 2

## 2021-06-06 MED ORDER — MOMETASONE FURO-FORMOTEROL FUM 200-5 MCG/ACT IN AERO
2.0000 | INHALATION_SPRAY | Freq: Two times a day (BID) | RESPIRATORY_TRACT | Status: DC
  Administered 2021-06-07: 2 via RESPIRATORY_TRACT
  Filled 2021-06-06: qty 8.8

## 2021-06-06 MED ORDER — BUSPIRONE HCL 10 MG PO TABS
20.0000 mg | ORAL_TABLET | Freq: Three times a day (TID) | ORAL | Status: DC
  Administered 2021-06-06 – 2021-06-07 (×3): 20 mg via ORAL
  Filled 2021-06-06 (×2): qty 2

## 2021-06-06 MED ORDER — AMLODIPINE BESYLATE 5 MG PO TABS: 5.0000 mg | ORAL_TABLET | Freq: Every day | ORAL | 1 refills | Status: DC

## 2021-06-06 MED ORDER — ALUM & MAG HYDROXIDE-SIMETH 200-200-20 MG/5ML PO SUSP: 30.0000 mL | Freq: Four times a day (QID) | ORAL | Status: DC | PRN

## 2021-06-06 MED ORDER — ACETAMINOPHEN 500 MG PO TABS
1000.0000 mg | ORAL_TABLET | Freq: Three times a day (TID) | ORAL | Status: DC | PRN
  Administered 2021-06-06: 1000 mg via ORAL
  Filled 2021-06-06: qty 2

## 2021-06-06 MED ORDER — AMLODIPINE BESYLATE 5 MG PO TABS
5.0000 mg | ORAL_TABLET | Freq: Every day | ORAL | Status: DC
  Administered 2021-06-06 – 2021-06-07 (×2): 5 mg via ORAL
  Filled 2021-06-06 (×2): qty 1

## 2021-06-06 MED ORDER — ASPIRIN EC 81 MG PO TBEC: 81.0000 mg | DELAYED_RELEASE_TABLET | Freq: Every day | ORAL | Status: DC

## 2021-06-06 MED ORDER — ATORVASTATIN CALCIUM 20 MG PO TABS
40.0000 mg | ORAL_TABLET | Freq: Every day | ORAL | Status: DC
  Administered 2021-06-07: 40 mg via ORAL
  Filled 2021-06-06: qty 2

## 2021-06-06 MED ORDER — HYDROCHLOROTHIAZIDE 25 MG PO TABS
25.0000 mg | ORAL_TABLET | Freq: Every day | ORAL | Status: DC
  Administered 2021-06-07: 25 mg via ORAL
  Filled 2021-06-06: qty 1

## 2021-06-06 MED ORDER — ALBUTEROL SULFATE HFA 108 (90 BASE) MCG/ACT IN AERS
2.0000 | INHALATION_SPRAY | Freq: Four times a day (QID) | RESPIRATORY_TRACT | Status: DC | PRN
  Filled 2021-06-06: qty 6.7

## 2021-06-06 MED ORDER — PANTOPRAZOLE SODIUM 40 MG PO TBEC
40.0000 mg | DELAYED_RELEASE_TABLET | Freq: Every day | ORAL | Status: DC
  Administered 2021-06-07: 40 mg via ORAL
  Filled 2021-06-06: qty 1

## 2021-06-06 MED ORDER — METFORMIN HCL 500 MG PO TABS
1000.0000 mg | ORAL_TABLET | Freq: Two times a day (BID) | ORAL | Status: DC
  Administered 2021-06-07: 1000 mg via ORAL
  Filled 2021-06-06: qty 2

## 2021-06-06 NOTE — ED Notes (Signed)
VOLUNTARY awaiting psych consult 

## 2021-06-06 NOTE — ED Notes (Addendum)
BP elevated, will speak to Dr. Joni Fears about treatment as meds ordered are for day time per pt. Pt complaining of anxiety

## 2021-06-06 NOTE — ED Notes (Signed)
Report received from [place], Conservation officer, nature. Patient alert and oriented, warm and dry, and in no acute distress. Patient denies HI, AVH and pain. Patient made aware of Q15 minute rounds and Engineer, drilling presence for their safety. Patient instructed to come to this nurse with needs or concerns.

## 2021-06-06 NOTE — ED Provider Notes (Signed)
[facility] Emergency Department Provider Note  ____________________________________________  Time seen: Approximately 3:57 PM  I have reviewed the triage vital signs and the nursing notes.   HISTORY  Chief Complaint Suicidal    HPI Ian [name] is a 60 y.o. male with a history of Barrett's esophagus, CAD depression GERD hypertension who comes the ED complaining of suicidal thoughts.  States that he is been feeling suicidal for the past week.  He used to take many different medicines but says that he stopped the mall and is only been taking his nighttime Seroquel and Ambien to help him sleep.  Denies any specific plan to harm himself, no HI, no hallucinations.  2 days ago his window air conditioner stopped working and its been very hot making it impossible for him to sleep.  He has been awake continuously, feeling sleep deprived and very frustrated, and this is making his mood even worse.  Denies any ingestions or alcohol use, no self-injurious behaviors recently.  He also notes having a burning pain in his throat that is likely his GERD.  Not taking any antacids.    Past Medical History:  Diagnosis Date   Anxiety    Barrett esophagus    Cancer ([place])    Coronary artery disease    Depression    GERD (gastroesophageal reflux disease)    Hypertension    Pre-diabetes    Stroke [facility]) 2014   "mini-stroke" per patient     Patient Active Problem List   Diagnosis Date Noted   Severe recurrent major depression ([place]) 09/06/2020   Generalized anxiety disorder 09/06/2020   COPD exacerbation ([place]) 09/06/2020   CVA (cerebral vascular accident) ([place]) 08/25/2019   Type 2 diabetes mellitus with hyperglycemia ([place]) 05/25/2019   Major depression, recurrent, chronic ([place]) 12/13/2018   Hepatitis C 12/13/2018   Leukocytosis 05/04/2018   Severe recurrent major depression without psychotic features ([place]) 11/05/2017   Dyslipidemia 01/07/2017   Barrett's esophagus  01/07/2017   Bipolar I disorder, most recent episode depressed with anxious distress ([place]) 01/06/2017   Tobacco use disorder 01/06/2017   Depression 10/06/2016   Acute left-sided weakness 05/03/2015   CVA (cerebral infarction) 05/03/2015   Weakness 11/16/2013   Chest pain 11/16/2013   HTN (hypertension) 11/16/2013   Left-sided weakness 11/16/2013   Torsades de pointes ([place]) 11/16/2013   Speech and language deficits 11/15/2013   Malignant neoplasm of prostate ([place]) 10/13/2013   Malignant (primary) neoplasm, unspecified ([place]) 05/06/2012     Past Surgical History:  Procedure Laterality Date   ANGIOPLASTY     APPENDECTOMY     CARDIAC CATHETERIZATION     CHOLECYSTECTOMY     WRIST SURGERY Right    age 31     Prior to Admission medications   Medication Sig Start Date End Date Taking? Authorizing Provider  ADVAIR DISKUS 500-50 MCG/DOSE AEPB Inhale 1 puff into the lungs 2 (two) times daily. 12/03/20  Yes [provider]  albuterol (VENTOLIN HFA) 108 (90 Base) MCG/ACT inhaler Inhale 2 puffs into the lungs every 6 (six) hours as needed for wheezing or shortness of breath. 09/20/20  Yes [name], [name], MD  amLODipine (NORVASC) 5 MG tablet Take 1 tablet (5 mg total) by mouth daily. 06/06/21  Yes Carrie Mew, MD  atorvastatin (LIPITOR) 40 MG tablet Take 1 tablet (40 mg total) by mouth daily. 01/15/21  Yes [name], MD  busPIRone (BUSPAR) 10 MG tablet Take 2 tablets (20 mg total) by mouth 3 (three)  times daily. 01/15/21  Yes [name], MD  clonazePAM (KLONOPIN) 0.5 MG tablet Take 1 tablet (0.5 mg total) by mouth 2 (two) times daily as needed (Anxiety). 01/15/21  Yes [name], MD  escitalopram (LEXAPRO) 20 MG tablet Take 1 tablet (20 mg total) by mouth daily. 01/15/21  Yes [name], MD  hydrochlorothiazide (HYDRODIURIL) 25 MG tablet Take 1 tablet (25 mg total) by mouth daily. 01/15/21  Yes [name], MD  lisinopril (ZESTRIL) 40 MG tablet Take 1  tablet (40 mg total) by mouth daily. 01/15/21  Yes [name], MD  metFORMIN (GLUCOPHAGE-XR) 500 MG 24 hr tablet Take 1,000 mg by mouth 2 (two) times daily. 04/17/21  Yes [provider]  pantoprazole (PROTONIX) 40 MG tablet Take 1 tablet (40 mg total) by mouth daily. 01/15/21  Yes [name], MD  QUEtiapine (SEROQUEL XR) 400 MG 24 hr tablet Take 1 tablet (400 mg total) by mouth at bedtime. 01/15/21  Yes [name], MD  zolpidem (AMBIEN) 10 MG tablet Take 1 tablet (10 mg total) by mouth at bedtime as needed for sleep. 01/15/21  Yes [name], MD  aspirin 81 MG chewable tablet Chew 1 tablet (81 mg total) by mouth daily. Patient not taking: Reported on 06/06/2021 09/21/20   [name], [name], MD  hydrOXYzine (ATARAX/VISTARIL) 25 MG tablet Take 1 tablet (25 mg total) by mouth 3 (three) times daily as needed for anxiety. Patient not taking: Reported on 06/06/2021 01/15/21   [name], MD  metFORMIN (FORTAMET) 1000 MG (OSM) 24 hr tablet Take 1 tablet (1,000 mg total) by mouth 2 (two) times daily with a meal. Patient not taking: Reported on 06/06/2021 01/15/21   [name], MD  mometasone-formoterol [facility]) 200-5 MCG/ACT AERO Inhale 2 puffs into the lungs 2 (two) times daily. Patient not taking: Reported on 06/06/2021 09/21/20   [name], [name], MD     Allergies Fluoxetine, Hydrocodone-acetaminophen, Mirtazapine, Tramadol, and Gabapentin   Family History  Problem Relation Age of Onset   Stroke Father    Leukemia Father    Breast cancer Sister     Social History Social History   Tobacco Use   Smoking status: Every Day    Packs/day: 1.00    Pack years: 0.00    Types: Cigarettes   Smokeless tobacco: Never  Vaping Use   Vaping Use: Never used  Substance Use Topics   Alcohol use: Not Currently   Drug use: Yes    Types: Cocaine, Marijuana    Review of Systems  Constitutional:   No fever or chills.  ENT:   Positive sore throat. No  rhinorrhea. Cardiovascular:   No chest pain or syncope. Respiratory:   No dyspnea or cough. Gastrointestinal:   Negative for abdominal pain, vomiting and diarrhea.  Musculoskeletal:   Negative for focal pain or swelling All other systems reviewed and are negative except as documented above in ROS and HPI.  ____________________________________________   PHYSICAL EXAM:  VITAL SIGNS: ED Triage Vitals  Enc Vitals Group     BP 06/06/21 1430 (!) 176/123     Pulse Rate 06/06/21 1430 (!) 115     Resp 06/06/21 1430 20     Temp 06/06/21 1430 98.5 F (36.9 C)     Temp Source 06/06/21 1430 Oral     SpO2 06/06/21 1430 93 %     Weight 06/06/21 1433 289 lb (131.1 kg)     Height 06/06/21 1433  6' (1.829 m)     Head Circumference --      Peak Flow --      Pain Score 06/06/21 1432 5     Pain Loc --      Pain Edu? --      Excl. in [place]? --     Vital signs reviewed, nursing assessments reviewed.   Constitutional:   Alert and oriented. Non-toxic appearance. Eyes:   Conjunctivae are normal. EOMI. PERRL. ENT      Head:   Normocephalic and atraumatic.      Nose:   Wearing a mask.      Mouth/Throat:   Wearing a mask.      Neck:   No meningismus. Full ROM. Hematological/Lymphatic/Immunilogical:   No cervical lymphadenopathy. Cardiovascular:   RRR. Symmetric bilateral radial and DP pulses.  No murmurs. Cap refill less than 2 seconds. Respiratory:   Normal respiratory effort without tachypnea/retractions. Breath sounds are clear and equal bilaterally. No wheezes/rales/rhonchi. Gastrointestinal:   Soft and nontender. Non distended. There is no CVA tenderness.  No rebound, rigidity, or guarding. Genitourinary:   deferred Musculoskeletal:   Normal range of motion in all extremities. No joint effusions.  No lower extremity tenderness.  No edema. Neurologic:   Normal speech and language.  Motor grossly intact. No acute focal neurologic deficits are appreciated.  Skin:    Skin is warm, dry and intact.  No rash noted.  No petechiae, purpura, or bullae.  ____________________________________________    LABS (pertinent positives/negatives) (all labs ordered are listed, but only abnormal results are displayed) Labs Reviewed  BASIC METABOLIC PANEL - Abnormal; Notable for the following components:      Result Value   Glucose, Bld 158 (*)    BUN 21 (*)    All other components within normal limits  CBC - Abnormal; Notable for the following components:   WBC 14.6 (*)    RBC 6.50 (*)    Hemoglobin 17.8 (*)    HCT 56.5 (*)    All other components within normal limits  SALICYLATE LEVEL - Abnormal; Notable for the following components:   Salicylate Lvl <8.5 (*)    All other components within normal limits  ACETAMINOPHEN LEVEL - Abnormal; Notable for the following components:   Acetaminophen (Tylenol), Serum <10 (*)    All other components within normal limits  URINE DRUG SCREEN, QUALITATIVE ([facility] ONLY) - Abnormal; Notable for the following components:   Tricyclic, Ur Screen POSITIVE (*)    All other components within normal limits  RESP PANEL BY RT-PCR (FLU A&B, COVID) ARPGX2  ETHANOL  POC SARS CORONAVIRUS 2 AG -  ED  POC SARS CORONAVIRUS 2 AG -  ED  TROPONIN I (HIGH SENSITIVITY)  TROPONIN I (HIGH SENSITIVITY)   ____________________________________________   EKG    ____________________________________________    RADIOLOGY  DG Chest Port 1 View  Result Date: 06/06/2021 CLINICAL DATA:  60 year old male with chest pain. EXAM: PORTABLE CHEST 1 VIEW COMPARISON:  Chest radiograph dated 09/05/2020. FINDINGS: There is mild diffuse chronic interstitial coarsening. No focal consolidation, pleural effusion, or pneumothorax. Stable cardiac silhouette. No acute osseous pathology. IMPRESSION: No active disease. Electronically Signed   By: Anner Crete M.D.   On: 06/06/2021 15:23     ____________________________________________   PROCEDURES Procedures  ____________________________________________    CLINICAL IMPRESSION / ASSESSMENT AND PLAN / ED COURSE  Medications ordered in the ED: Medications  alum & mag hydroxide-simeth (MAALOX/MYLANTA) 200-200-20 MG/5ML suspension 30 mL (has no administration in  time range)  nicotine (NICODERM CQ - dosed in mg/24 hours) patch 21 mg (21 mg Transdermal Patch Applied 06/06/21 1605)  amLODipine (NORVASC) tablet 5 mg (has no administration in time range)  clonazePAM (KLONOPIN) tablet 0.5 mg (has no administration in time range)  mometasone-formoterol (DULERA) 200-5 MCG/ACT inhaler 2 puff (has no administration in time range)  albuterol (VENTOLIN HFA) 108 (90 Base) MCG/ACT inhaler 2 puff (has no administration in time range)  aspirin EC tablet 81 mg (has no administration in time range)  atorvastatin (LIPITOR) tablet 40 mg (has no administration in time range)  busPIRone (BUSPAR) tablet 20 mg (has no administration in time range)  clonazePAM (KLONOPIN) tablet 0.5 mg (has no administration in time range)  escitalopram (LEXAPRO) tablet 20 mg (has no administration in time range)  hydrochlorothiazide (HYDRODIURIL) tablet 25 mg (has no administration in time range)  lisinopril (ZESTRIL) tablet 40 mg (has no administration in time range)  metFORMIN (GLUCOPHAGE) tablet 1,000 mg (has no administration in time range)  pantoprazole (PROTONIX) EC tablet 40 mg (has no administration in time range)  QUEtiapine (SEROQUEL XR) 24 hr tablet 400 mg (has no administration in time range)  zolpidem (AMBIEN) tablet 10 mg (has no administration in time range)  acetaminophen (TYLENOL) tablet 1,000 mg (has no administration in time range)  famotidine (PEPCID) tablet 40 mg (40 mg Oral Given 06/06/21 1605)    Pertinent labs & imaging results that were available during my care of the patient were reviewed by me and considered in my medical decision  making (see chart for details).  Ian [name] was evaluated in Emergency Department on 06/06/2021 for the symptoms described in the history of present illness. He was evaluated in the context of the global COVID-19 pandemic, which necessitated consideration that the patient might be at risk for infection with the SARS-CoV-2 virus that causes COVID-19. Institutional protocols and algorithms that pertain to the evaluation of patients at risk for COVID-19 are in a state of rapid change based on information released by regulatory bodies including the CDC and federal and state organizations. These policies and algorithms were followed during the patient's care in the ED.   Patient presents with suicidal ideation.  Not an imminent risk to himself or others, not committable.  Will consult psychiatry for further evaluation.  Throat pain consistent with GERD.  Will give Maalox and Pepcid.  Patient is nontoxic and medically stable.  Lab work-up reassuring.  Will start amlodipine for his hypertension.  The patient has been placed in psychiatric observation due to the need to provide a safe environment for the patient while obtaining psychiatric consultation and evaluation, as well as ongoing medical and medication management to treat the patient's condition.  The patient has not been placed under full IVC at this time.       ____________________________________________   FINAL CLINICAL IMPRESSION(S) / ED DIAGNOSES    Final diagnoses:  Suicidal ideation  Essential hypertension  Morbid obesity [facility])     ED Discharge Orders          Ordered    amLODipine (NORVASC) 5 MG tablet  Daily        06/06/21 1602            Portions of this note were generated with dragon dictation software. Dictation errors may occur despite best attempts at proofreading.   Carrie Mew, MD 06/06/21 1745

## 2021-06-06 NOTE — Consult Note (Signed)
[facility] Consult   Reason for Consult: Consult for 60 year old man with a history of depression and anxiety Referring Physician: Joni Fears Patient Identification: Ian [name] MRN:  [id number] Principal Diagnosis: Major depression, recurrent, chronic ([place]) Diagnosis:  Principal Problem:   Major depression, recurrent, chronic ([place]) Active Problems:   HTN (hypertension)   Depression   Generalized anxiety disorder   COPD exacerbation ([place])   Type 2 diabetes mellitus with hyperglycemia (Ian [name])   Total Time spent with patient: 1 hour  Subjective:   Ian [name] is a 60 y.o. male patient admitted with a "I thought I might end up drinking".  HPI: Patient seen chart reviewed.  Patient well-known to the psychiatric service.  60 year old man with chronic anxiety and depression.  He reports a extremely stressful situation in the last couple weeks.  He had been looking forward to getting approved for section 8 housing and was almost there when they turned him down for what sounds like petty reasons including the fact that he had a charge in 1989.  When he realized he got turned down and that he was stuck in his trailer that has no air conditioning and remains virtually uninhabitable he felt completely overwhelmed.  He has stopped taking all of his medicine except his sleeping pills.  He started to have thoughts about drinking and using drugs.  Started to feel hopeless and have suicidal thoughts and panic attacks.  He has not relapsed on to substance abuse however.  He has been keeping in touch with his peers support and providers at [facility].  Past Psychiatric History: Long history of depression and anxiety.  Past history of alcohol abuse.  Has been doing a good job at staying sober recently.  Still suffers with chronic mental health issues.  Risk to Self:   Risk to Others:   Prior Inpatient Therapy:   Prior Outpatient Therapy:    Past Medical History:  Past Medical History:   Diagnosis Date   Anxiety    Barrett esophagus    Cancer ([place])    Coronary artery disease    Depression    GERD (gastroesophageal reflux disease)    Hypertension    Pre-diabetes    Stroke ([place]) 2014   "mini-stroke" per patient    Past Surgical History:  Procedure Laterality Date   ANGIOPLASTY     APPENDECTOMY     CARDIAC CATHETERIZATION     CHOLECYSTECTOMY     WRIST SURGERY Right    age 60   Family History:  Family History  Problem Relation Age of Onset   Stroke Father    Leukemia Father    Breast cancer Sister    Family Psychiatric  History: See previous Social History:  Social History   Substance and Sexual Activity  Alcohol Use Not Currently     Social History   Substance and Sexual Activity  Drug Use Yes   Types: Cocaine, Marijuana    Social History   Socioeconomic History   Marital status: Divorced    Spouse name: Not on file   Number of children: Not on file   Years of education: Not on file   Highest education level: Not on file  Occupational History   Not on file  Tobacco Use   Smoking status: Every Day    Packs/day: 1.00    Pack years: 0.00    Types: Cigarettes   Smokeless tobacco: Never  Vaping Use   Vaping Use: Never used  Substance and Sexual Activity  Alcohol use: Not Currently   Drug use: Yes    Types: Cocaine, Marijuana   Sexual activity: Not on file  Other Topics Concern   Not on file  Social History Narrative   Not on file   Social Determinants of Health   Financial Resource Strain: Not on file  Food Insecurity: Not on file  Transportation Needs: Not on file  Physical Activity: Not on file  Stress: Not on file  Social Connections: Not on file   Additional Social History:    Allergies:   Allergies  Allergen Reactions   Fluoxetine Swelling    Anxiety, itching   Hydrocodone-Acetaminophen Itching   Mirtazapine Other (See Comments)    nightmares   Tramadol Other (See Comments)   Gabapentin Other (See Comments)     Nightmares     Labs:  Results for orders placed or performed during the hospital encounter of 06/06/21 (from the past 48 hour(s))  Basic metabolic panel     Status: Abnormal   Collection Time: 06/06/21  2:35 PM  Result Value Ref Range   Sodium 137 135 - 145 mmol/L   Potassium 4.5 3.5 - 5.1 mmol/L   Chloride 103 98 - 111 mmol/L   CO2 23 22 - 32 mmol/L   Glucose, Bld 158 (H) 70 - 99 mg/dL    Comment: Glucose reference range applies only to samples taken after fasting for at least 8 hours.   BUN 21 (H) 6 - 20 mg/dL   Creatinine, Ser 1.01 0.61 - 1.24 mg/dL   Calcium 9.4 8.9 - 10.3 mg/dL   GFR, Estimated >60 >60 mL/min    Comment: (NOTE) Calculated using the CKD-EPI Creatinine Equation (2021)    Anion gap 11 5 - 15    Comment: Performed at [facility], [address] [place], [place] [address]  CBC     Status: Abnormal   Collection Time: 06/06/21  2:35 PM  Result Value Ref Range   WBC 14.6 (H) 4.0 - 10.5 K/uL   RBC 6.50 (H) 4.22 - 5.81 MIL/uL   Hemoglobin 17.8 (H) 13.0 - 17.0 g/dL   HCT 56.5 (H) 39.0 - 52.0 %   MCV 86.9 80.0 - 100.0 fL   MCH 27.4 26.0 - 34.0 pg   MCHC 31.5 30.0 - 36.0 g/dL   RDW 13.1 11.5 - 15.5 %   Platelets 237 150 - 400 K/uL   nRBC 0.0 0.0 - 0.2 %    Comment: Performed at [facility], [place], [place] [address]  Troponin I (High Sensitivity)     Status: None   Collection Time: 06/06/21  2:35 PM  Result Value Ref Range   Troponin I (High Sensitivity) 4 <18 ng/L    Comment: (NOTE) Elevated high sensitivity troponin I (hsTnI) values and significant  changes across serial measurements may suggest ACS but many other  chronic and acute conditions are known to elevate hsTnI results.  Refer to the "Links" section for chest pain algorithms and additional  guidance. Performed at [facility], [address] [place], [place] [address]   Ethanol     Status: None   Collection Time: 06/06/21  2:35 PM  Result Value  Ref Range   Alcohol, Ethyl (B) <10 <10 mg/dL    Comment: (NOTE) Lowest detectable limit for serum alcohol is 10 mg/dL.  For medical purposes only. Performed at [facility], [address], [place], [place] [address]   Salicylate level  Status: Abnormal   Collection Time: 06/06/21  2:35 PM  Result Value Ref Range   Salicylate Lvl <1.6 (L) 7.0 - 30.0 mg/dL    Comment: Performed at [facility], [place], [place] [address]  Acetaminophen level     Status: Abnormal   Collection Time: 06/06/21  2:35 PM  Result Value Ref Range   Acetaminophen (Tylenol), Serum <10 (L) 10 - 30 ug/mL    Comment: (NOTE) Therapeutic concentrations vary significantly. A range of 10-30 ug/mL  may be an effective concentration for many patients. However, some  are best treated at concentrations outside of this range. Acetaminophen concentrations >150 ug/mL at 4 hours after ingestion  and >50 ug/mL at 12 hours after ingestion are often associated with  toxic reactions.  Performed at [facility], [address], [place], [place] [address]     Current Facility-Administered Medications  Medication Dose Route Frequency Provider Last Rate Last Admin   albuterol (VENTOLIN HFA) 108 (90 Base) MCG/ACT inhaler 2 puff  2 puff Inhalation Q6H PRN Aaryan Essman, [name], MD       alum & mag hydroxide-simeth (MAALOX/MYLANTA) 200-200-20 MG/5ML suspension 30 mL  30 mL Oral Q6H PRN Carrie Mew, MD       amLODipine (NORVASC) tablet 5 mg  5 mg Oral Daily Carrie Mew, MD       aspirin EC tablet 81 mg  81 mg Oral Daily Navina Wohlers, [name], MD       atorvastatin (LIPITOR) tablet 40 mg  40 mg Oral Daily Ivar Domangue, [name], MD       busPIRone (BUSPAR) tablet 20 mg  20 mg Oral TID Bernardette Waldron, [name], MD       clonazePAM Bobbye [place]) tablet 0.5 mg  0.5 mg Oral BID PRN Carrie Mew, MD       clonazePAM Bobbye [place]) tablet 0.5 mg  0.5 mg Oral BID Koi Yarbro, [name], MD       escitalopram  (LEXAPRO) tablet 20 mg  20 mg Oral Daily Natallie Ravenscroft, [name], MD       hydrochlorothiazide (HYDRODIURIL) tablet 25 mg  25 mg Oral Daily Reynard Christoffersen T, MD       lisinopril (ZESTRIL) tablet 40 mg  40 mg Oral Daily Espiridion Supinski T, MD       metFORMIN (GLUCOPHAGE) tablet 1,000 mg  1,000 mg Oral BID WC Reuven Braver T, MD       mometasone-formoterol (DULERA) 200-5 MCG/ACT inhaler 2 puff  2 puff Inhalation BID Kasidee Voisin, [name], MD       nicotine (NICODERM CQ - dosed in mg/24 hours) patch 21 mg  21 mg Transdermal Daily Carrie Mew, MD   21 mg at 06/06/21 1605   pantoprazole (PROTONIX) EC tablet 40 mg  40 mg Oral Daily Aljean Horiuchi T, MD       QUEtiapine (SEROQUEL XR) 24 hr tablet 400 mg  400 mg Oral QHS Vadie Principato T, MD       zolpidem (AMBIEN) tablet 10 mg  10 mg Oral QHS PRN Eder Macek, [name], MD       Current Outpatient Medications  Medication Sig Dispense Refill   amLODipine (NORVASC) 5 MG tablet Take 1 tablet (5 mg total) by mouth daily. 30 tablet 1   ADVAIR DISKUS 500-50 MCG/DOSE AEPB Inhale 1 puff into the lungs 2 (two) times daily.     albuterol (VENTOLIN HFA) 108 (90 Base) MCG/ACT inhaler Inhale 2 puffs into the lungs every 6 (six) hours as  needed for wheezing or shortness of breath. 18 g 1   aspirin 81 MG chewable tablet Chew 1 tablet (81 mg total) by mouth daily. 30 tablet 1   atorvastatin (LIPITOR) 40 MG tablet Take 1 tablet (40 mg total) by mouth daily. 30 tablet 1   busPIRone (BUSPAR) 10 MG tablet Take 2 tablets (20 mg total) by mouth 3 (three) times daily. 180 tablet 1   clonazePAM (KLONOPIN) 0.5 MG tablet Take 1 tablet (0.5 mg total) by mouth 2 (two) times daily as needed (Anxiety). 60 tablet 1   escitalopram (LEXAPRO) 20 MG tablet Take 1 tablet (20 mg total) by mouth daily. 30 tablet 1   hydrochlorothiazide (HYDRODIURIL) 25 MG tablet Take 1 tablet (25 mg total) by mouth daily. 30 tablet 1   hydrOXYzine (ATARAX/VISTARIL) 25 MG tablet Take 1 tablet (25 mg total) by mouth 3 (three)  times daily as needed for anxiety. 90 tablet 1   lisinopril (ZESTRIL) 40 MG tablet Take 1 tablet (40 mg total) by mouth daily. 30 tablet 1   metFORMIN (FORTAMET) 1000 MG (OSM) 24 hr tablet Take 1 tablet (1,000 mg total) by mouth 2 (two) times daily with a meal. 60 tablet 1   mometasone-formoterol (DULERA) 200-5 MCG/ACT AERO Inhale 2 puffs into the lungs 2 (two) times daily. 1 each 1   pantoprazole (PROTONIX) 40 MG tablet Take 1 tablet (40 mg total) by mouth daily. 30 tablet 1   QUEtiapine (SEROQUEL XR) 400 MG 24 hr tablet Take 1 tablet (400 mg total) by mouth at bedtime. 30 tablet 1   zolpidem (AMBIEN) 10 MG tablet Take 1 tablet (10 mg total) by mouth at bedtime as needed for sleep. 30 tablet 1    Musculoskeletal: Strength & Muscle Tone: within normal limits Gait & Station: normal Patient leans: N/A            Psychiatric Specialty Exam:  Presentation  General Appearance:  No data recorded Eye Contact: No data recorded Speech: No data recorded Speech Volume: No data recorded Handedness: No data recorded  Mood and Affect  Mood: No data recorded Affect: No data recorded  Thought Process  Thought Processes: No data recorded Descriptions of Associations:No data recorded Orientation:No data recorded Thought Content:No data recorded History of Schizophrenia/Schizoaffective disorder:No data recorded Duration of Psychotic Symptoms:No data recorded Hallucinations:No data recorded Ideas of Reference:No data recorded Suicidal Thoughts:No data recorded Homicidal Thoughts:No data recorded  Sensorium  Memory: No data recorded Judgment: No data recorded Insight: No data recorded  Executive Functions  Concentration: No data recorded Attention Span: No data recorded Recall: No data recorded Fund of Knowledge: No data recorded Language: No data recorded  Psychomotor Activity  Psychomotor Activity: No data recorded  Assets  Assets: No data  recorded  Sleep  Sleep: No data recorded  Physical Exam: Physical Exam Vitals and nursing note reviewed.  Constitutional:      Appearance: Normal appearance.  HENT:     Head: Normocephalic and atraumatic.     Mouth/Throat:     Pharynx: Oropharynx is clear.  Eyes:     Pupils: Pupils are equal, round, and reactive to light.  Cardiovascular:     Rate and Rhythm: Normal rate and regular rhythm.  Pulmonary:     Effort: Pulmonary effort is normal.     Breath sounds: Normal breath sounds.  Abdominal:     General: Abdomen is flat.     Palpations: Abdomen is soft.  Musculoskeletal:        General:  Normal range of motion.  Skin:    General: Skin is warm and dry.  Neurological:     General: No focal deficit present.     Mental Status: He is alert. Mental status is at baseline.  Psychiatric:        Attention and Perception: Attention normal.        Mood and Affect: Mood is anxious and depressed.        Speech: Speech normal.        Behavior: Behavior is slowed.        Thought Content: Thought content includes suicidal ideation. Thought content includes suicidal plan.        Cognition and Memory: Cognition is impaired. Memory is impaired.        Judgment: Judgment is impulsive.   Review of Systems  Constitutional: Negative.   HENT: Negative.    Eyes: Negative.   Respiratory: Negative.    Cardiovascular: Negative.   Gastrointestinal: Negative.   Musculoskeletal: Negative.   Skin: Negative.   Neurological: Negative.   Psychiatric/Behavioral:  Positive for depression and suicidal ideas. Negative for hallucinations, memory loss and substance abuse. The patient is nervous/anxious and has insomnia.   Blood pressure (!) 176/123, pulse (!) 115, temperature 98.5 F (36.9 C), temperature source Oral, resp. rate 20, height 6' (1.829 m), weight 131.1 kg, SpO2 93 %. Body mass index is 39.2 kg/m.  Treatment Plan Summary: Medication management and Plan restarted medications including the  medicines for his multiple medical issues.  He has high blood pressure that requires several agents to control, COPD diabetes managed with oral medication.  He has a past history of very and long QTC and several of his medicines are concerning because of that but his EKG today does not show a long interval and he has generally tolerated the medicines as I entered them.  Also restart psychiatric medicine.  Labs reviewed.  Case reviewed with TTS and with inpatient team.  Patient is appropriate for admission to psychiatric ward if his COVID test is negative and his blood pressure is trending down once he gets his medicine.  Patient agreeable to plan.  Disposition: Recommend psychiatric Inpatient admission when medically cleared. Supportive therapy provided about ongoing stressors.  Alethia Berthold, MD 06/06/2021 4:24 PM

## 2021-06-06 NOTE — ED Triage Notes (Signed)
Pt to ED for SI for the past couple days. Expresses stress of living in trailer, [facility] going out and having problems with housing. Denies plan but states he might just starting drinking  Pt with rambling and pressured speech  "I am just totally lost"  Endorses cp that started last night.  NAD noted

## 2021-06-06 NOTE — BH Assessment (Signed)
Comprehensive Clinical Assessment (CCA) Note  06/06/2021 Ian [name] [id number]  Ian [name] is a 60 year old male who presents to the ER due to having thoughts of ending his life because of recent stressors that have occurred. 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 He needed to get it as proof. He also had to challenge a complaint against him by a lady that has passed many years ago. Afterwards, they have denied him. Patient state that it has caused him to use his money that he has save and caused him further frustration and triggers with his mental health. 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 However, today (06/06/2021) when the air condition in his trailer home went out, it became too much for him to managed and that's when he started to have the thoughts of ending his life. He reports, he was doing well and making progress but each time he would "run into brick wall."  He is active with his Peer Support and his mental health provider. He denies any other charges, except the ones that took place in 1989. He also denies hearing voices or seeing things. He denies HI.  Chief Complaint:  Chief Complaint  Patient presents with   Suicidal   Visit Diagnosis: Major Depression    CCA Screening, Triage and Referral (STR)  Patient Reported Information How did you hear about [place]? Self  Referral name: No data recorded Referral phone number: [phone]   Whom do you see for routine medical problems? Primary Care  Practice/Facility Name: [facility]  Practice/Facility Phone Number: No data recorded Name of Contact: No data recorded Contact Number: No data recorded Contact Fax Number: No data  recorded Prescriber Name: No data recorded Prescriber Address (if known): No data recorded  What Is the Reason for Your Visit/Call Today? Thoughts of ending his life  How Long Has This Been Causing You Problems? 1 wk - 1 month  What Do You Feel Would Help You the Most Today? Treatment for Depression or other mood problem   Have You Recently Been in Any Inpatient Treatment (Hospital/Detox/Crisis Center/28-Day Program)? No  Name/Location of Program/Hospital:No data recorded How Long Were You There? No data recorded When Were You Discharged? No data recorded  Have You Ever Received Services From [facility] Before? Yes  Who Do You See at [facility]? Behavior Health   Have You Recently Had Any Thoughts About Hurting Yourself? Yes  Are You Planning to Commit Suicide/Harm Yourself At This time? No   Have you Recently Had Thoughts About [place]? No  Explanation: No data recorded  Have You Used Any Alcohol or Drugs in the Past 24 Hours? No  How Long Ago Did You Use Drugs or Alcohol? No data recorded What Did You Use and How Much? No data recorded  Do You Currently Have a Therapist/Psychiatrist? Yes  Name of Therapist/Psychiatrist: [facility]   Have You Been Recently Discharged From Any Office Practice or Programs? No  Explanation of Discharge From Practice/Program: No data recorded    CCA Screening Triage Referral Assessment Type of Contact: Face-to-Face  Is this Initial or Reassessment? No data recorded Date Telepsych consult ordered in [facility]:  09/05/20  Time Telepsych  consult ordered in [facility]:  2242   Patient Reported Information Reviewed? Yes  Patient Left Without Being Seen? No data recorded Reason for Not Completing Assessment: No data recorded  Collateral Involvement: None   Does Patient Have a Court Appointed Legal Guardian? No data recorded Name and Contact of Legal Guardian: Self  If Minor and Not Living with Parent(s), Who has Custody? n/a  Is  CPS involved or ever been involved? Never  Is APS involved or ever been involved? Never   Patient Determined To Be At Risk for Harm To Self or Others Based on Review of Patient Reported Information or Presenting Complaint? Yes, for Self-Harm  Method: No data recorded Availability of Means: No data recorded Intent: No data recorded Notification Required: No data recorded Additional Information for Danger to Others Potential: No data recorded Additional Comments for Danger to Others Potential: No data recorded Are There Guns or Other Weapons in Your Home? No data recorded Types of Guns/Weapons: No data recorded Are These Weapons Safely Secured?                            No data recorded Who Could Verify You Are Able To Have These Secured: No data recorded Do You Have any Outstanding Charges, Pending Court Dates, Parole/Probation? No data recorded Contacted To Inform of Risk of Harm To Self or Others: No data recorded  [facility] of Assessment: [facility] ED   Does Patient Present under Involuntary Commitment? No  IVC Papers Initial File Date: No data recorded  [place] of Residence: [place]   Patient Currently Receiving the Following Services: Medication Management; Peer Support Services   Determination of Need: Emergent (2 hours)   Options For Referral: Inpatient Hospitalization     CCA Biopsychosocial Intake/Chief Complaint:  No data recorded Current Symptoms/Problems: No data recorded  Patient Reported Schizophrenia/Schizoaffective Diagnosis in Past: No data recorded  Strengths: Have support, able to recognize when he need help, able to seeking help  Preferences: No data recorded Abilities: No data recorded  Type of Services Patient Feels are Needed: No data recorded  Initial Clinical Notes/Concerns: No data recorded  Mental Health Symptoms Depression:   Change in energy/activity; Tearfulness; Worthlessness   Duration of Depressive symptoms:  Greater than two  weeks   Mania:   None   Anxiety:    Restlessness; Worrying; Tension; Sleep; Fatigue; Difficulty concentrating   Psychosis:   None   Duration of Psychotic symptoms: No data recorded  Trauma:   None   Obsessions:   None   Compulsions:   None   Inattention:   None   Hyperactivity/Impulsivity:   None   Oppositional/Defiant Behaviors:   None   Emotional Irregularity:   None   Other Mood/Personality Symptoms:  No data recorded   Mental Status Exam Appearance and self-care  Stature:   Average   Weight:   Overweight   Clothing:   Neat/clean; Age-appropriate   Grooming:   Normal   Cosmetic use:   None   Posture/gait:   Normal   Motor activity:   -- (within normal range)   Sensorium  Attention:   Normal   Concentration:   Normal   Orientation:   X5   Recall/memory:   Normal   Affect and Mood  Affect:   Appropriate; Depressed   Mood:   Anxious; Depressed   Relating  Eye contact:   Normal   Facial expression:   Depressed; Anxious   Attitude  toward examiner:   Cooperative   Thought and Language  Speech flow:  Clear and Coherent; Normal   Thought content:   Appropriate to Mood and Circumstances   Preoccupation:   None   Hallucinations:   None   Organization:  No data recorded  [employer] of Knowledge:   Average   Intelligence:   Average   Abstraction:   Normal   Judgement:   Fair   Art therapist:   Adequate   Insight:   Fair   Decision Making:   Normal   Social Functioning  Social Maturity:   Responsible   Social Judgement:   Normal   Stress  Stressors:  No data recorded  Coping Ability:   Normal   Skill Deficits:   None   Supports:   Friends/Service system     Religion: Religion/Spirituality Are You A Religious Person?: No  Leisure/Recreation: Leisure / Recreation Do You Have Hobbies?: No  Exercise/Diet: Exercise/Diet Do You Exercise?: No Have You Gained or  Lost A Significant Amount of Weight in the Past Six Months?: No Do You Follow a Special Diet?: No Do You Have Any Trouble Sleeping?: No   CCA Employment/Education Employment/Work Situation: Employment / Work Technical sales engineer: On disability Why is Patient on Disability: Physical and mental health Patient's Job has Been Impacted by Current Illness: No  Education: Education Is Patient Currently Attending School?: No   CCA Family/Childhood History Family and Relationship History: Family history Marital status: Single Does patient have children?: No  Childhood History:  Childhood History Did patient suffer any verbal/emotional/physical/sexual abuse as a child?: No Did patient suffer from severe childhood neglect?: No Has patient ever been sexually abused/assaulted/raped as an adolescent or adult?: No Was the patient ever a victim of a crime or a disaster?: No Witnessed domestic violence?: No Has patient been affected by domestic violence as an adult?: No  Child/Adolescent Assessment:   CCA Substance Use Alcohol/Drug Use: Alcohol / Drug Use Pain Medications: See PTA Prescriptions: See PTA Over the Counter: See PTA History of alcohol / drug use?: Yes Longest period of sobriety (when/how long): Unable to quantify Substance #1 Name of Substance 1: Cocaine Substance #2 Name of Substance 2: Alcohol  ASAM's:  Six Dimensions of Multidimensional Assessment  Dimension 1:  Acute Intoxication and/or Withdrawal Potential:      Dimension 2:  Biomedical Conditions and Complications:      Dimension 3:  Emotional, Behavioral, or Cognitive Conditions and Complications:     Dimension 4:  Readiness to Change:     Dimension 5:  Relapse, Continued use, or Continued Problem Potential:     Dimension 6:  Recovery/Living Environment:     ASAM Severity Score:    ASAM Recommended Level of Treatment:     Substance use Disorder (SUD)    Recommendations for  Services/Supports/Treatments:    DSM5 Diagnoses: Patient Active Problem List   Diagnosis Date Noted   Severe recurrent major depression ([place]) 09/06/2020   Generalized anxiety disorder 09/06/2020   COPD exacerbation ([place]) 09/06/2020   CVA (cerebral vascular accident) ([place]) 08/25/2019   Type 2 diabetes mellitus with hyperglycemia ([place]) 05/25/2019   Major depression, recurrent, chronic ([place]) 12/13/2018   Hepatitis C 12/13/2018   Leukocytosis 05/04/2018   Severe recurrent major depression without psychotic features ([place]) 11/05/2017   Dyslipidemia 01/07/2017   Barrett's esophagus 01/07/2017   Bipolar I disorder, most recent episode depressed with anxious distress ([place]) 01/06/2017   Tobacco use disorder 01/06/2017  Depression 10/06/2016   Acute left-sided weakness 05/03/2015   CVA (cerebral infarction) 05/03/2015   Weakness 11/16/2013   Chest pain 11/16/2013   HTN (hypertension) 11/16/2013   Left-sided weakness 11/16/2013   Torsades de pointes ([place]) 11/16/2013   Speech and language deficits 11/15/2013   Malignant neoplasm of prostate ([place]) 10/13/2013   Malignant (primary) neoplasm, unspecified ([place]) 05/06/2012   Patient Centered Plan: Patient is on the following Treatment Plan(s): Major Depression  Referrals to Alternative Service(s): Referred to Alternative Service(s):   Place:   Date:   Time:    Referred to Alternative Service(s):   Place:   Date:   Time:    Referred to Alternative Service(s):   Place:   Date:   Time:    Referred to Alternative Service(s):   Place:   Date:   Time:     Gunnar Fusi MS, LCAS, [facility], [facility] Therapeutic Triage Specialist 06/06/2021 6:57 PM

## 2021-06-06 NOTE — ED Notes (Signed)
Hourly rounding performed, patient currently awake in room. Patient has no complaints at this time. Q15 minute rounds and monitoring via Rover and Officer to continue. 

## 2021-06-06 NOTE — ED Notes (Addendum)
Pt dressed out with this RN and Heather NT. Belongings include: Shoes Cabin crew of clothes  ipad Cigarettes Lighter [employer]

## 2021-06-07 ENCOUNTER — Encounter: Payer: Self-pay | Admitting: Psychiatry

## 2021-06-07 ENCOUNTER — Inpatient Hospital Stay
Admission: EM | Admit: 2021-06-07 | Discharge: 2021-06-21 | DRG: 885 | Disposition: A | Discharge status: Home / Self Care | Payer: No Typology Code available for payment source | Attending: Behavioral Health | Admitting: Behavioral Health

## 2021-06-07 ENCOUNTER — Other Ambulatory Visit: Payer: Self-pay

## 2021-06-07 DIAGNOSIS — Z9151 Personal history of suicidal behavior: Secondary | ICD-10-CM

## 2021-06-07 DIAGNOSIS — E785 Hyperlipidemia, unspecified: Secondary | ICD-10-CM | POA: Diagnosis present

## 2021-06-07 DIAGNOSIS — I2510 Atherosclerotic heart disease of native coronary artery without angina pectoris: Secondary | ICD-10-CM | POA: Diagnosis present

## 2021-06-07 DIAGNOSIS — G4700 Insomnia, unspecified: Secondary | ICD-10-CM | POA: Diagnosis present

## 2021-06-07 DIAGNOSIS — Z8673 Personal history of transient ischemic attack (TIA), and cerebral infarction without residual deficits: Secondary | ICD-10-CM | POA: Diagnosis not present

## 2021-06-07 DIAGNOSIS — Z9114 Patient's other noncompliance with medication regimen: Secondary | ICD-10-CM

## 2021-06-07 DIAGNOSIS — F332 Major depressive disorder, recurrent severe without psychotic features: Secondary | ICD-10-CM | POA: Diagnosis present

## 2021-06-07 DIAGNOSIS — Z7982 Long term (current) use of aspirin: Secondary | ICD-10-CM

## 2021-06-07 DIAGNOSIS — R45851 Suicidal ideations: Secondary | ICD-10-CM | POA: Diagnosis present

## 2021-06-07 DIAGNOSIS — F339 Major depressive disorder, recurrent, unspecified: Secondary | ICD-10-CM | POA: Diagnosis not present

## 2021-06-07 DIAGNOSIS — Z7984 Long term (current) use of oral hypoglycemic drugs: Secondary | ICD-10-CM

## 2021-06-07 DIAGNOSIS — F410 Panic disorder [episodic paroxysmal anxiety] without agoraphobia: Secondary | ICD-10-CM | POA: Diagnosis present

## 2021-06-07 DIAGNOSIS — Z20822 Contact with and (suspected) exposure to covid-19: Secondary | ICD-10-CM | POA: Diagnosis present

## 2021-06-07 DIAGNOSIS — E1165 Type 2 diabetes mellitus with hyperglycemia: Secondary | ICD-10-CM | POA: Diagnosis present

## 2021-06-07 DIAGNOSIS — F411 Generalized anxiety disorder: Secondary | ICD-10-CM | POA: Diagnosis present

## 2021-06-07 DIAGNOSIS — Z6839 Body mass index (BMI) 39.0-39.9, adult: Secondary | ICD-10-CM | POA: Diagnosis not present

## 2021-06-07 DIAGNOSIS — Z79899 Other long term (current) drug therapy: Secondary | ICD-10-CM | POA: Diagnosis not present

## 2021-06-07 DIAGNOSIS — K219 Gastro-esophageal reflux disease without esophagitis: Secondary | ICD-10-CM | POA: Diagnosis present

## 2021-06-07 DIAGNOSIS — Z7951 Long term (current) use of inhaled steroids: Secondary | ICD-10-CM | POA: Diagnosis not present

## 2021-06-07 DIAGNOSIS — F17210 Nicotine dependence, cigarettes, uncomplicated: Secondary | ICD-10-CM | POA: Diagnosis present

## 2021-06-07 DIAGNOSIS — I10 Essential (primary) hypertension: Secondary | ICD-10-CM | POA: Diagnosis present

## 2021-06-07 HISTORY — DX: Chronic obstructive pulmonary disease, unspecified: J44.9

## 2021-06-07 MED ORDER — PANTOPRAZOLE SODIUM 40 MG PO TBEC
40.0000 mg | DELAYED_RELEASE_TABLET | Freq: Every day | ORAL | Status: DC
  Administered 2021-06-07 – 2021-06-21 (×15): 40 mg via ORAL
  Filled 2021-06-07 (×15): qty 1

## 2021-06-07 MED ORDER — CLONAZEPAM 0.5 MG PO TABS
0.5000 mg | ORAL_TABLET | Freq: Two times a day (BID) | ORAL | Status: DC | PRN
  Administered 2021-06-07 – 2021-06-20 (×6): 0.5 mg via ORAL
  Filled 2021-06-07 (×6): qty 1

## 2021-06-07 MED ORDER — ALUM & MAG HYDROXIDE-SIMETH 200-200-20 MG/5ML PO SUSP: 30.0000 mL | ORAL | Status: DC | PRN

## 2021-06-07 MED ORDER — ZOLPIDEM TARTRATE 5 MG PO TABS
10.0000 mg | ORAL_TABLET | Freq: Every day | ORAL | Status: DC
  Administered 2021-06-07 – 2021-06-20 (×14): 10 mg via ORAL
  Filled 2021-06-07 (×14): qty 2

## 2021-06-07 MED ORDER — ZOLPIDEM TARTRATE 5 MG PO TABS: 10.0000 mg | ORAL_TABLET | Freq: Every evening | ORAL | Status: DC | PRN

## 2021-06-07 MED ORDER — ATORVASTATIN CALCIUM 20 MG PO TABS
40.0000 mg | ORAL_TABLET | Freq: Every day | ORAL | Status: DC
  Administered 2021-06-07 – 2021-06-21 (×15): 40 mg via ORAL
  Filled 2021-06-07 (×15): qty 2

## 2021-06-07 MED ORDER — MAGNESIUM HYDROXIDE 400 MG/5ML PO SUSP: 30.0000 mL | Freq: Every day | ORAL | Status: DC | PRN

## 2021-06-07 MED ORDER — QUETIAPINE FUMARATE ER 200 MG PO TB24
400.0000 mg | ORAL_TABLET | Freq: Every day | ORAL | Status: DC
  Administered 2021-06-07 – 2021-06-20 (×14): 400 mg via ORAL
  Filled 2021-06-07 (×16): qty 2

## 2021-06-07 MED ORDER — HYDROXYZINE HCL 25 MG PO TABS
25.0000 mg | ORAL_TABLET | Freq: Three times a day (TID) | ORAL | Status: DC | PRN
  Administered 2021-06-07 – 2021-06-17 (×4): 25 mg via ORAL
  Filled 2021-06-07 (×4): qty 1

## 2021-06-07 MED ORDER — BUSPIRONE HCL 10 MG PO TABS
20.0000 mg | ORAL_TABLET | Freq: Three times a day (TID) | ORAL | Status: DC
  Administered 2021-06-07 – 2021-06-21 (×43): 20 mg via ORAL
  Filled 2021-06-07: qty 4
  Filled 2021-06-07: qty 2
  Filled 2021-06-07: qty 4
  Filled 2021-06-07: qty 2
  Filled 2021-06-07 (×5): qty 4
  Filled 2021-06-07 (×3): qty 2
  Filled 2021-06-07: qty 4
  Filled 2021-06-07: qty 2
  Filled 2021-06-07: qty 4
  Filled 2021-06-07: qty 2
  Filled 2021-06-07: qty 4
  Filled 2021-06-07 (×2): qty 2
  Filled 2021-06-07: qty 4
  Filled 2021-06-07 (×3): qty 2
  Filled 2021-06-07: qty 4
  Filled 2021-06-07 (×2): qty 2
  Filled 2021-06-07: qty 4
  Filled 2021-06-07: qty 2
  Filled 2021-06-07 (×2): qty 4
  Filled 2021-06-07 (×6): qty 2
  Filled 2021-06-07 (×6): qty 4
  Filled 2021-06-07 (×2): qty 2
  Filled 2021-06-07: qty 4
  Filled 2021-06-07 (×2): qty 2
  Filled 2021-06-07: qty 4
  Filled 2021-06-07 (×4): qty 2
  Filled 2021-06-07 (×4): qty 4
  Filled 2021-06-07: qty 2
  Filled 2021-06-07 (×2): qty 4
  Filled 2021-06-07 (×4): qty 2
  Filled 2021-06-07 (×2): qty 4
  Filled 2021-06-07: qty 2
  Filled 2021-06-07 (×2): qty 4
  Filled 2021-06-07: qty 2
  Filled 2021-06-07: qty 4
  Filled 2021-06-07: qty 2
  Filled 2021-06-07: qty 4

## 2021-06-07 MED ORDER — AMLODIPINE BESYLATE 5 MG PO TABS
5.0000 mg | ORAL_TABLET | Freq: Every day | ORAL | Status: DC
  Administered 2021-06-07 – 2021-06-11 (×5): 5 mg via ORAL
  Filled 2021-06-07 (×5): qty 1

## 2021-06-07 MED ORDER — ASPIRIN EC 81 MG PO TBEC
81.0000 mg | DELAYED_RELEASE_TABLET | Freq: Every day | ORAL | Status: DC
  Administered 2021-06-07 – 2021-06-21 (×15): 81 mg via ORAL
  Filled 2021-06-07 (×15): qty 1

## 2021-06-07 MED ORDER — ALUM & MAG HYDROXIDE-SIMETH 200-200-20 MG/5ML PO SUSP: 30.0000 mL | Freq: Four times a day (QID) | ORAL | Status: DC | PRN

## 2021-06-07 MED ORDER — HYDRALAZINE HCL 10 MG PO TABS
10.0000 mg | ORAL_TABLET | Freq: Four times a day (QID) | ORAL | Status: DC | PRN
  Administered 2021-06-07: 10 mg via ORAL
  Filled 2021-06-07 (×2): qty 1

## 2021-06-07 MED ORDER — ESCITALOPRAM OXALATE 10 MG PO TABS
20.0000 mg | ORAL_TABLET | Freq: Every day | ORAL | Status: DC
  Administered 2021-06-07 – 2021-06-21 (×15): 20 mg via ORAL
  Filled 2021-06-07 (×15): qty 2

## 2021-06-07 MED ORDER — ACETAMINOPHEN 325 MG PO TABS
650.0000 mg | ORAL_TABLET | Freq: Four times a day (QID) | ORAL | Status: DC | PRN
  Administered 2021-06-08 (×2): 650 mg via ORAL
  Filled 2021-06-07 (×2): qty 2

## 2021-06-07 MED ORDER — ALBUTEROL SULFATE HFA 108 (90 BASE) MCG/ACT IN AERS
2.0000 | INHALATION_SPRAY | Freq: Four times a day (QID) | RESPIRATORY_TRACT | Status: DC | PRN
  Administered 2021-06-09 – 2021-06-20 (×5): 2 via RESPIRATORY_TRACT
  Filled 2021-06-07 (×3): qty 6.7

## 2021-06-07 MED ORDER — NICOTINE 21 MG/24HR TD PT24
21.0000 mg | MEDICATED_PATCH | Freq: Every day | TRANSDERMAL | Status: DC
  Administered 2021-06-08 – 2021-06-19 (×12): 21 mg via TRANSDERMAL
  Filled 2021-06-07 (×14): qty 1

## 2021-06-07 MED ORDER — METFORMIN HCL 500 MG PO TABS
1000.0000 mg | ORAL_TABLET | Freq: Two times a day (BID) | ORAL | Status: DC
  Administered 2021-06-07 – 2021-06-21 (×28): 1000 mg via ORAL
  Filled 2021-06-07 (×28): qty 2

## 2021-06-07 MED ORDER — LISINOPRIL 20 MG PO TABS
40.0000 mg | ORAL_TABLET | Freq: Every day | ORAL | Status: DC
  Administered 2021-06-07 – 2021-06-14 (×6): 40 mg via ORAL
  Filled 2021-06-07 (×7): qty 2

## 2021-06-07 MED ORDER — MOMETASONE FURO-FORMOTEROL FUM 200-5 MCG/ACT IN AERO
2.0000 | INHALATION_SPRAY | Freq: Two times a day (BID) | RESPIRATORY_TRACT | Status: DC
  Administered 2021-06-07 – 2021-06-21 (×18): 2 via RESPIRATORY_TRACT
  Filled 2021-06-07: qty 8.8

## 2021-06-07 MED ORDER — HYDROCHLOROTHIAZIDE 25 MG PO TABS
25.0000 mg | ORAL_TABLET | Freq: Every day | ORAL | Status: DC
  Administered 2021-06-07 – 2021-06-21 (×13): 25 mg via ORAL
  Filled 2021-06-07 (×14): qty 1

## 2021-06-07 NOTE — BHH Group Notes (Signed)
LCSW Group Therapy Note  06/07/2021 2:15 PM  Type of Therapy and Topic:  Group Therapy:  Feelings around Relapse and Recovery  Participation Level:  Did Not Attend   Description of Group:    Patients in this group will discuss emotions they experience before and after a relapse. They will process how experiencing these feelings, or avoidance of experiencing them, relates to having a relapse. Facilitator will guide patients to explore emotions they have related to recovery. Patients will be encouraged to process which emotions are more powerful. They will be guided to discuss the emotional reaction significant others in their lives may have to their relapse or recovery. Patients will be assisted in exploring ways to respond to the emotions of others without this contributing to a relapse.  Therapeutic Goals: Patient will identify two or more emotions that lead to a relapse for them Patient will identify two emotions that result when they relapse Patient will identify two emotions related to recovery Patient will demonstrate ability to communicate their needs through discussion and/or role plays   Summary of Patient Progress:  X   Therapeutic Modalities:   Cognitive Behavioral Therapy Solution-Focused Therapy Assertiveness Training Relapse Prevention Therapy   Assunta Curtis, MSW, LCSW 06/07/2021 2:15 PM

## 2021-06-07 NOTE — BH Assessment (Signed)
Patient's BP continued to be too high last night. Patient's BP to be re-checked this morning 06/07/21 to determine if patient can be admitted to [facility].

## 2021-06-07 NOTE — H&P (Signed)
Psychiatric Admission Assessment Adult  Patient Identification: Ian [name] MRN:  [id number] Date of Evaluation:  06/07/2021 Chief Complaint:  Severe recurrent major depression without psychotic features ([place]) [F33.2] Principal Diagnosis: Severe recurrent major depression without psychotic features ([place]) Diagnosis:  Principal Problem:   Severe recurrent major depression without psychotic features ([place]) Active Problems:   HTN (hypertension)   Dyslipidemia   Generalized anxiety disorder   Type 2 diabetes mellitus with hyperglycemia ([place])  CC "Couldn't take it anymore."  History of Present Illness: 60 year old male with history of depression and anxiety presenting with worsening mood and suicidal ideations.  He has been admitted to our service multiple times in the past.  He lives in a trailer in which he hoards objects.  His current living conditions are fairly uninhabitable.  He had finally taken steps to secure section 8 housing with coordination through his peers support and outpatient psychiatrist.  It sounds that he had almost secured housing when he was turned down for a misdemeanor charge from 1989.  Shortly thereafter the air conditioning in his trailer stopped working and he began overwhelmed and hopeless.  He began having suicidal thoughts, and feeling that he might relapse on substances.  He then came to the hospital at the urging of his outpatient psychiatrist.  Today he continues to feel anxious and overwhelmed about his situation.  His blood pressure is noted to be quite elevated on admission.  He does note that he has somewhat blurred vision and a headache at this time.  As needed hydralazine will be provided.  EKG and troponins completed in the emergency room and within normal limits.  He has been off of his medicines for approximately 2 weeks, but does feel that they are helpful when he is on them.  All medications have been resumed as below.  He continues to have some passive  suicidal ideations today but denies homicidal ideations, auditory hallucinations, or visual hallucinations.  Associated Signs/Symptoms: Depression Symptoms:  depressed mood, anhedonia, insomnia, fatigue, feelings of worthlessness/guilt, difficulty concentrating, hopelessness, recurrent thoughts of death, anxiety, panic attacks, Duration of Depression Symptoms: Greater than two weeks  (Hypo) Manic Symptoms:  Impulsivity, Anxiety Symptoms:  Excessive Worry, Panic Symptoms, Social Anxiety, Psychotic Symptoms:   None PTSD Symptoms: Negative Total Time spent with patient: 1 hour  Past Psychiatric History: Long history of both depression and anxiety, multiple inpatient admissions.  He does have a history of alcohol abuse, but has stayed sober for quite some time.  Positive past suicide attempts.  Is the patient at risk to self? Yes.    Has the patient been a risk to self in the past 6 months? Yes.    Has the patient been a risk to self within the distant past? Yes.    Is the patient a risk to others? No.  Has the patient been a risk to others in the past 6 months? No.  Has the patient been a risk to others within the distant past? No.   Prior Inpatient Therapy:   Prior Outpatient Therapy:    Alcohol Screening: Patient refused Alcohol Screening Tool: Yes 1. How often do you have a drink containing alcohol?: Never 2. How many drinks containing alcohol do you have on a typical day when you are drinking?: 1 or 2 3. How often do you have six or more drinks on one occasion?: Never AUDIT-C Score: 0 4. How often during the last year have you found that you were not able to stop drinking once  you had started?: Never 5. How often during the last year have you failed to do what was normally expected from you because of drinking?: Never 6. How often during the last year have you needed a first drink in the morning to get yourself going after a heavy drinking session?: Never 7. How often  during the last year have you had a feeling of guilt of remorse after drinking?: Never 8. How often during the last year have you been unable to remember what happened the night before because you had been drinking?: Never 9. Have you or someone else been injured as a result of your drinking?: No 10. Has a relative or friend or a doctor or another health worker been concerned about your drinking or suggested you cut down?: No Alcohol Use Disorder Identification Test Final Score (AUDIT): 0 Substance Abuse History in the last 12 months:  No. Consequences of Substance Abuse: Negative Previous Psychotropic Medications: Yes  Psychological Evaluations: Yes  Past Medical History:  Past Medical History:  Diagnosis Date   Anxiety    Barrett esophagus    Cancer ([place])    COPD (chronic obstructive pulmonary disease) ([place])    Coronary artery disease    Depression    GERD (gastroesophageal reflux disease)    Hypertension    Pre-diabetes    Stroke ([place]) 2014   "mini-stroke" per patient    Past Surgical History:  Procedure Laterality Date   ANGIOPLASTY     APPENDECTOMY     CARDIAC CATHETERIZATION     CHOLECYSTECTOMY     WRIST SURGERY Right    age 65   Family History:  Family History  Problem Relation Age of Onset   Stroke Father    Leukemia Father    Breast cancer Sister    Family Psychiatric  History: Denies Tobacco Screening:   Social History:  Social History   Substance and Sexual Activity  Alcohol Use Not Currently     Social History   Substance and Sexual Activity  Drug Use Yes   Types: Cocaine, Marijuana    Additional Social History:                           Allergies:   Allergies  Allergen Reactions   Fluoxetine Swelling    Anxiety, itching   Hydrocodone-Acetaminophen Itching   Mirtazapine Other (See Comments)    nightmares   Tramadol Other (See Comments)   Gabapentin Other (See Comments)    Nightmares    Lab Results:  Results for orders  placed or performed during the hospital encounter of 06/06/21 (from the past 48 hour(s))  Basic metabolic panel     Status: Abnormal   Collection Time: 06/06/21  2:35 PM  Result Value Ref Range   Sodium 137 135 - 145 mmol/L   Potassium 4.5 3.5 - 5.1 mmol/L   Chloride 103 98 - 111 mmol/L   CO2 23 22 - 32 mmol/L   Glucose, Bld 158 (H) 70 - 99 mg/dL    Comment: Glucose reference range applies only to samples taken after fasting for at least 8 hours.   BUN 21 (H) 6 - 20 mg/dL   Creatinine, Ser 1.01 0.61 - 1.24 mg/dL   Calcium 9.4 8.9 - 10.3 mg/dL   GFR, Estimated >60 >60 mL/min    Comment: (NOTE) Calculated using the CKD-EPI Creatinine Equation (2021)    Anion gap 11 5 - 15    Comment: Performed at  [facility], [address] [place], [place] [address]  CBC     Status: Abnormal   Collection Time: 06/06/21  2:35 PM  Result Value Ref Range   WBC 14.6 (H) 4.0 - 10.5 K/uL   RBC 6.50 (H) 4.22 - 5.81 MIL/uL   Hemoglobin 17.8 (H) 13.0 - 17.0 g/dL   HCT 56.5 (H) 39.0 - 52.0 %   MCV 86.9 80.0 - 100.0 fL   MCH 27.4 26.0 - 34.0 pg   MCHC 31.5 30.0 - 36.0 g/dL   RDW 13.1 11.5 - 15.5 %   Platelets 237 150 - 400 K/uL   nRBC 0.0 0.0 - 0.2 %    Comment: Performed at [facility], [place], [place] [address]  Troponin I (High Sensitivity)     Status: None   Collection Time: 06/06/21  2:35 PM  Result Value Ref Range   Troponin I (High Sensitivity) 4 <18 ng/L    Comment: (NOTE) Elevated high sensitivity troponin I (hsTnI) values and significant  changes across serial measurements may suggest ACS but many other  chronic and acute conditions are known to elevate hsTnI results.  Refer to the "Links" section for chest pain algorithms and additional  guidance. Performed at [facility], [address] [place], [place] [address]   Ethanol     Status: None   Collection Time: 06/06/21  2:35 PM  Result Value Ref Range   Alcohol, Ethyl (B) <10 <10 mg/dL     Comment: (NOTE) Lowest detectable limit for serum alcohol is 10 mg/dL.  For medical purposes only. Performed at [facility], [address] [place], [place] [address]   Salicylate level     Status: Abnormal   Collection Time: 06/06/21  2:35 PM  Result Value Ref Range   Salicylate Lvl <5.4 (L) 7.0 - 30.0 mg/dL    Comment: Performed at [facility], [place], [place] [address]  Acetaminophen level     Status: Abnormal   Collection Time: 06/06/21  2:35 PM  Result Value Ref Range   Acetaminophen (Tylenol), Serum <10 (L) 10 - 30 ug/mL    Comment: (NOTE) Therapeutic concentrations vary significantly. A range of 10-30 ug/mL  may be an effective concentration for many patients. However, some  are best treated at concentrations outside of this range. Acetaminophen concentrations >150 ug/mL at 4 hours after ingestion  and >50 ug/mL at 12 hours after ingestion are often associated with  toxic reactions.  Performed at [facility], [place], [place] [address]   Resp Panel by RT-PCR (Flu A&B, Covid) Nasopharyngeal Swab     Status: None   Collection Time: 06/06/21  4:15 PM   Specimen: Nasopharyngeal Swab; Nasopharyngeal(NP) swabs in vial transport medium  Result Value Ref Range   SARS Coronavirus 2 by RT PCR NEGATIVE NEGATIVE    Comment: (NOTE) SARS-CoV-2 target nucleic acids are NOT DETECTED.  The SARS-CoV-2 RNA is generally detectable in upper respiratory specimens during the acute phase of infection. The lowest concentration of SARS-CoV-2 viral copies this assay can detect is 138 copies/mL. A negative result does not preclude SARS-Cov-2 infection and should not be used as the sole basis for treatment or other patient management decisions. A negative result may occur with  improper specimen collection/handling, submission of specimen other than nasopharyngeal swab, presence of viral mutation(s) within the areas targeted  by this assay, and inadequate number of viral copies(<138 copies/mL). A negative result  must be combined with clinical observations, patient history, and epidemiological information. The expected result is Negative.  Fact Sheet for Patients:  EntrepreneurPulse.com.au  Fact Sheet for Healthcare Providers:  IncredibleEmployment.be  This test is no t yet approved or cleared by the [place] FDA and  has been authorized for detection and/or diagnosis of SARS-CoV-2 by FDA under an Emergency Use Authorization (EUA). This EUA will remain  in effect (meaning this test can be used) for the duration of the COVID-19 declaration under Section 564(b)(1) of the Act, 21 U.S.C.section 360bbb-3(b)(1), unless the authorization is terminated  or revoked sooner.       Influenza A by PCR NEGATIVE NEGATIVE   Influenza B by PCR NEGATIVE NEGATIVE    Comment: (NOTE) The Xpert Xpress SARS-CoV-2/FLU/RSV plus assay is intended as an aid in the diagnosis of influenza from Nasopharyngeal swab specimens and should not be used as a sole basis for treatment. Nasal washings and aspirates are unacceptable for Xpert Xpress SARS-CoV-2/FLU/RSV testing.  Fact Sheet for Patients: EntrepreneurPulse.com.au  Fact Sheet for Healthcare Providers: IncredibleEmployment.be  This test is not yet approved or cleared by the [place] FDA and has been authorized for detection and/or diagnosis of SARS-CoV-2 by FDA under an Emergency Use Authorization (EUA). This EUA will remain in effect (meaning this test can be used) for the duration of the COVID-19 declaration under Section 564(b)(1) of the Act, 21 U.S.C. section 360bbb-3(b)(1), unless the authorization is terminated or revoked.  Performed at [facility], [place], [place] [address]   Troponin I (High Sensitivity)     Status: None   Collection Time: 06/06/21  4:15  PM  Result Value Ref Range   Troponin I (High Sensitivity) 4 <18 ng/L    Comment: (NOTE) Elevated high sensitivity troponin I (hsTnI) values and significant  changes across serial measurements may suggest ACS but many other  chronic and acute conditions are known to elevate hsTnI results.  Refer to the "Links" section for chest pain algorithms and additional  guidance. Performed at [facility], [address] [place], [place] [address]   Urine Drug Screen, Qualitative     Status: Abnormal   Collection Time: 06/06/21  4:54 PM  Result Value Ref Range   Tricyclic, Ur Screen POSITIVE (A) NONE DETECTED   Amphetamines, Ur Screen NONE DETECTED NONE DETECTED   MDMA (Ecstasy)Ur Screen NONE DETECTED NONE DETECTED   Cocaine Metabolite,Ur [place] NONE DETECTED NONE DETECTED   Opiate, Ur Screen NONE DETECTED NONE DETECTED   Phencyclidine (PCP) Ur S NONE DETECTED NONE DETECTED   Cannabinoid 50 Ng, Ur  NONE DETECTED NONE DETECTED   Barbiturates, Ur Screen NONE DETECTED NONE DETECTED   Benzodiazepine, Ur Scrn NONE DETECTED NONE DETECTED   Methadone Scn, Ur NONE DETECTED NONE DETECTED    Comment: (NOTE) Tricyclics + metabolites, urine    Cutoff 1000 ng/mL Amphetamines + metabolites, urine  Cutoff 1000 ng/mL MDMA (Ecstasy), urine              Cutoff 500 ng/mL Cocaine Metabolite, urine          Cutoff 300 ng/mL Opiate + metabolites, urine        Cutoff 300 ng/mL Phencyclidine (PCP), urine         Cutoff 25 ng/mL Cannabinoid, urine                 Cutoff 50 ng/mL Barbiturates + metabolites, urine  Cutoff 200 ng/mL Benzodiazepine, urine  Cutoff 200 ng/mL Methadone, urine                   Cutoff 300 ng/mL  The urine drug screen provides only a preliminary, unconfirmed analytical test result and should not be used for non-medical purposes. Clinical consideration and professional judgment should be applied to any positive drug screen result due to possible interfering  substances. A more specific alternate chemical method must be used in order to obtain a confirmed analytical result. Gas chromatography / mass spectrometry (GC/MS) is the preferred confirm atory method. Performed at [facility], [address] [place], [place] [address]     Blood Alcohol level:  Lab Results  Component Value Date   [facility] <10 06/06/2021   ETH <10 98/92/1194    Metabolic Disorder Labs:  Lab Results  Component Value Date   HGBA1C 7.4 (H) 01/04/2021   MPG 165.68 01/04/2021   MPG 165.68 08/26/2019   No results found for: PROLACTIN Lab Results  Component Value Date   CHOL 178 08/26/2019   TRIG 375 (H) 08/26/2019   HDL 23 (L) 08/26/2019   CHOLHDL 7.7 08/26/2019   VLDL 75 (H) 08/26/2019   LDLCALC 80 08/26/2019   LDLCALC 70 12/13/2018    Current Medications: Current Facility-Administered Medications  Medication Dose Route Frequency Provider Last Rate Last Admin   acetaminophen (TYLENOL) tablet 650 mg  650 mg Oral Q6H PRN [name], [name], MD       albuterol (VENTOLIN HFA) 108 (90 Base) MCG/ACT inhaler 2 puff  2 puff Inhalation Q6H PRN [name], [name], MD       alum & mag hydroxide-simeth (MAALOX/MYLANTA) 200-200-20 MG/5ML suspension 30 mL  30 mL Oral Q4H PRN [name], [name], MD       amLODipine (NORVASC) tablet 5 mg  5 mg Oral Daily [name], [name], MD       aspirin EC tablet 81 mg  81 mg Oral Daily [name], MD       atorvastatin (LIPITOR) tablet 40 mg  40 mg Oral Daily [name], [name], MD       busPIRone (BUSPAR) tablet 20 mg  20 mg Oral TID [name], [name], MD       clonazePAM (KLONOPIN) tablet 0.5 mg  0.5 mg Oral BID PRN [name], [name], MD       escitalopram (LEXAPRO) tablet 20 mg  20 mg Oral Daily [name], [name], MD       hydrALAZINE (APRESOLINE) tablet 10 mg  10 mg Oral Q6H PRN [name], MD       hydrochlorothiazide (HYDRODIURIL) tablet 25 mg  25 mg Oral Daily [name], [name], MD       hydrOXYzine (ATARAX/VISTARIL) tablet 25 mg   25 mg Oral TID PRN [name], MD       lisinopril (ZESTRIL) tablet 40 mg  40 mg Oral Daily [name], [name], MD       magnesium hydroxide (MILK OF MAGNESIA) suspension 30 mL  30 mL Oral Daily PRN [name], [name], MD       metFORMIN (GLUCOPHAGE) tablet 1,000 mg  1,000 mg Oral BID WC [name], [name], MD       mometasone-formoterol (DULERA) 200-5 MCG/ACT inhaler 2 puff  2 puff Inhalation BID [name], [name], MD       nicotine (NICODERM CQ - dosed in mg/24 hours) patch 21 mg  21 mg Transdermal Daily [name], [name], MD  pantoprazole (PROTONIX) EC tablet 40 mg  40 mg Oral Daily [name], [name], MD       QUEtiapine (SEROQUEL XR) 24 hr tablet 400 mg  400 mg Oral QHS [name], [name], MD       zolpidem (AMBIEN) tablet 10 mg  10 mg Oral QHS [name], MD       PTA Medications: Medications Prior to Admission  Medication Sig Dispense Refill Last Dose   ADVAIR DISKUS 500-50 MCG/DOSE AEPB Inhale 1 puff into the lungs 2 (two) times daily.      albuterol (VENTOLIN HFA) 108 (90 Base) MCG/ACT inhaler Inhale 2 puffs into the lungs every 6 (six) hours as needed for wheezing or shortness of breath. 18 g 1    amLODipine (NORVASC) 5 MG tablet Take 1 tablet (5 mg total) by mouth daily. 30 tablet 1    aspirin 81 MG chewable tablet Chew 1 tablet (81 mg total) by mouth daily. (Patient not taking: Reported on 06/06/2021) 30 tablet 1    atorvastatin (LIPITOR) 40 MG tablet Take 1 tablet (40 mg total) by mouth daily. 30 tablet 1    busPIRone (BUSPAR) 10 MG tablet Take 2 tablets (20 mg total) by mouth 3 (three) times daily. 180 tablet 1    clonazePAM (KLONOPIN) 0.5 MG tablet Take 1 tablet (0.5 mg total) by mouth 2 (two) times daily as needed (Anxiety). 60 tablet 1    escitalopram (LEXAPRO) 20 MG tablet Take 1 tablet (20 mg total) by mouth daily. 30 tablet 1    hydrochlorothiazide (HYDRODIURIL) 25 MG tablet Take 1 tablet (25 mg total) by mouth daily. 30 tablet 1    hydrOXYzine (ATARAX/VISTARIL) 25 MG tablet  Take 1 tablet (25 mg total) by mouth 3 (three) times daily as needed for anxiety. (Patient not taking: Reported on 06/06/2021) 90 tablet 1    lisinopril (ZESTRIL) 40 MG tablet Take 1 tablet (40 mg total) by mouth daily. 30 tablet 1    metFORMIN (FORTAMET) 1000 MG (OSM) 24 hr tablet Take 1 tablet (1,000 mg total) by mouth 2 (two) times daily with a meal. (Patient not taking: Reported on 06/06/2021) 60 tablet 1    metFORMIN (GLUCOPHAGE-XR) 500 MG 24 hr tablet Take 1,000 mg by mouth 2 (two) times daily.      mometasone-formoterol (DULERA) 200-5 MCG/ACT AERO Inhale 2 puffs into the lungs 2 (two) times daily. (Patient not taking: Reported on 06/06/2021) 1 each 1    pantoprazole (PROTONIX) 40 MG tablet Take 1 tablet (40 mg total) by mouth daily. 30 tablet 1    QUEtiapine (SEROQUEL XR) 400 MG 24 hr tablet Take 1 tablet (400 mg total) by mouth at bedtime. 30 tablet 1    zolpidem (AMBIEN) 10 MG tablet Take 1 tablet (10 mg total) by mouth at bedtime as needed for sleep. 30 tablet 1     Musculoskeletal: Strength & Muscle Tone: within normal limits Gait & Station: normal Patient leans: N/A            Psychiatric Specialty Exam:  Presentation  General Appearance: Disheveled  Eye Contact:Good  Speech:Normal Rate  Speech Volume:Normal  Handedness:Right   Mood and Affect  Mood:Anxious; Depressed  Affect:Congruent   Thought Process  Thought Processes:Coherent; Goal Directed  Duration of Psychotic Symptoms: No data recorded Past Diagnosis of Schizophrenia or Psychoactive disorder: No data recorded Descriptions of Associations:Intact  Orientation:Full (Time, Place and Person)  Thought Content:Logical  Hallucinations:Hallucinations: None  Ideas of Reference:None  Suicidal Thoughts:Suicidal Thoughts: Yes,  Passive  Homicidal Thoughts:Homicidal Thoughts: No   Sensorium  Memory:Immediate Good; Recent Good; Remote Good  Judgment:Intact  Insight:Fair   Executive Functions   Concentration:Good  Attention Span:Good  [place] of Knowledge:Good  Language:Good   Psychomotor Activity  Psychomotor Activity:Psychomotor Activity: Increased   Assets  Assets:Communication Skills; Desire for Improvement; Financial Resources/Insurance; Resilience; Social Support   Sleep  Sleep:Sleep: Poor    Physical Exam: Physical Exam Vitals and nursing note reviewed.  Constitutional:      Appearance: Normal appearance.  HENT:     Head: Normocephalic and atraumatic.     Right Ear: External ear normal.     Left Ear: External ear normal.     Nose: Nose normal.     Mouth/Throat:     Mouth: Mucous membranes are moist.     Pharynx: Oropharynx is clear.  Eyes:     Extraocular Movements: Extraocular movements intact.     Conjunctiva/sclera: Conjunctivae normal.     Pupils: Pupils are equal, round, and reactive to light.  Cardiovascular:     Rate and Rhythm: Normal rate.     Pulses: Normal pulses.     Heart sounds: Normal heart sounds.  Pulmonary:     Effort: Pulmonary effort is normal.     Breath sounds: Normal breath sounds.  Abdominal:     General: Abdomen is flat.     Palpations: Abdomen is soft.  Musculoskeletal:        General: No swelling. Normal range of motion.     Cervical back: Normal range of motion.  Skin:    General: Skin is warm and dry.  Neurological:     General: No focal deficit present.     Mental Status: He is alert and oriented to person, place, and time.  Psychiatric:        Attention and Perception: Attention and perception normal.        Mood and Affect: Mood is anxious and depressed.        Speech: Speech normal.        Behavior: Behavior is cooperative.        Thought Content: Thought content includes suicidal ideation.        Cognition and Memory: Cognition and memory normal.        Judgment: Judgment is impulsive.   Review of Systems  Constitutional: Negative.   HENT: Negative.    Eyes:  Positive for blurred  vision.  Respiratory: Negative.    Cardiovascular: Negative.   Gastrointestinal: Negative.   Genitourinary: Negative.   Musculoskeletal: Negative.   Skin: Negative.   Neurological:  Positive for headaches. Negative for dizziness and focal weakness.  Endo/Heme/Allergies:  Positive for environmental allergies. Does not bruise/bleed easily.  Psychiatric/Behavioral:  Positive for depression and suicidal ideas. Negative for hallucinations, memory loss and substance abuse. The patient is nervous/anxious and has insomnia.   Blood pressure (!) 161/121, pulse 93, temperature 97.8 F (36.6 C), temperature source Oral, SpO2 95 %. There is no height or weight on file to calculate BMI.  Treatment Plan Summary: Daily contact with patient to assess and evaluate symptoms and progress in treatment and Medication management  1) MDD, recurrent, severe without psychotic features- unstable - Lexapro 20 mg daily, Seroquel 400 mg QHS  2) Generalized anxiety disorder - Lexapro as above, Buspar 20 mg TID, klonopin 0.5 mg BID PRN  3) HTN- uncontrolled - Amlodipine 5 mg daily, HCTZ 25 mg daily, lisinopril 40 mg daily, hydralazine 10 mg Q6hr PRN  4) HLD - Lipitor 40 mg daily, check lipid panel in the morning  5) Diabetes Mellitus Type 2 - Metformin 1000 mg BID with meals, will check hemoglobin a1c in the morning. Depending on results may need to add sliding scale novolog with meals and bedtime  6) COPD - Dulera 2 puffs 2 times daily, albuterol PRN  7) GERD - Protonix 40 mg daily   8) Insomnia - Ambien 10 mg QHS, seroquel as above    Observation Level/Precautions:  15 minute checks  Laboratory:   lipid panel and hemoglobin a1c  Psychotherapy:    Medications:    Consultations:    Discharge Concerns:    Estimated LOS:  Other:     Physician Treatment Plan for Primary Diagnosis: Severe recurrent major depression without psychotic features ([place]) Long Term Goal(s): Improvement in symptoms so as ready  for discharge  Short Term Goals: Ability to identify changes in lifestyle to reduce recurrence of condition will improve, Ability to verbalize feelings will improve, Ability to disclose and discuss suicidal ideas, Ability to demonstrate self-control will improve, Ability to identify and develop effective coping behaviors will improve, Ability to maintain clinical measurements within normal limits will improve, Compliance with prescribed medications will improve, and Ability to identify triggers associated with substance abuse/mental health issues will improve  Physician Treatment Plan for Secondary Diagnosis: Principal Problem:   Severe recurrent major depression without psychotic features ([place]) Active Problems:   HTN (hypertension)   Dyslipidemia   Generalized anxiety disorder   Type 2 diabetes mellitus with hyperglycemia ([place])  Long Term Goal(s): Improvement in symptoms so as ready for discharge  Short Term Goals: Ability to identify changes in lifestyle to reduce recurrence of condition will improve, Ability to verbalize feelings will improve, Ability to disclose and discuss suicidal ideas, Ability to demonstrate self-control will improve, Ability to identify and develop effective coping behaviors will improve, Ability to maintain clinical measurements within normal limits will improve, Compliance with prescribed medications will improve, and Ability to identify triggers associated with substance abuse/mental health issues will improve  I certify that inpatient services furnished can reasonably be expected to improve the patient's condition.    [name], MD 6/17/20221:21 PM

## 2021-06-07 NOTE — BHH Suicide Risk Assessment (Signed)
[name]   Nursing information obtained from:    Demographic factors:    Current Mental Status:    Loss Factors:    Historical Factors:    Risk Reduction Factors:     Total Time spent with patient: 1 hour Principal Problem: Severe recurrent major depression without psychotic features (Ian [name]) Diagnosis:  Principal Problem:   Severe recurrent major depression without psychotic features (Ian [name]) Active Problems:   HTN (hypertension)   Dyslipidemia   Generalized anxiety disorder   Type 2 diabetes mellitus with hyperglycemia (HCC)  Subjective Data:  60 year old male with history of depression and anxiety presenting with worsening mood and suicidal ideations.  He has been admitted to our service multiple times in the past.  He lives in a trailer in which he hoards objects.  His current living conditions are fairly uninhabitable.  He had finally taken steps to secure section 8 housing with coordination through his peers support and outpatient psychiatrist.  It sounds that he had almost secured housing when he was turned down for a misdemeanor charge from 1989.  Shortly thereafter the air conditioning in his trailer stopped working and he began overwhelmed and hopeless.  He began having suicidal thoughts, and feeling that he might relapse on substances.  He then came to the hospital at the urging of his outpatient psychiatrist.  Today he continues to feel anxious and overwhelmed about his situation.  His blood pressure is noted to be quite elevated on admission.  He does note that he has somewhat blurred vision and a headache at this time.  As needed hydralazine will be provided.  EKG and troponins completed in the emergency room and within normal limits.  He has been off of his medicines for approximately 2 weeks, but does feel that they are helpful when he is on them.  All medications have been resumed as below.  He continues to have some passive suicidal ideations today but  denies homicidal ideations, auditory hallucinations, or visual hallucinations.  Continued Clinical Symptoms:  Alcohol Use Disorder Identification Test Final Score (AUDIT): 0 The "Alcohol Use Disorders Identification Test", Guidelines for Use in Primary Care, Second Edition.  World Pharmacologist [facility]). Score between 0-7:  no or low risk or alcohol related problems. Score between 8-15:  moderate risk of alcohol related problems. Score between 16-19:  high risk of alcohol related problems. Score 20 or above:  warrants further diagnostic evaluation for alcohol dependence and treatment.   CLINICAL FACTORS:   Severe Anxiety and/or Agitation Depression:   Anhedonia Hopelessness Impulsivity Insomnia Severe More than one psychiatric diagnosis Previous Psychiatric Diagnoses and Treatments Medical Diagnoses and Treatments/Surgeries   Musculoskeletal: Strength & Muscle Tone: within normal limits Gait & Station: normal Patient leans: N/A  Psychiatric Specialty Exam:  Presentation  General Appearance: Disheveled  Eye Contact:Good  Speech:Normal Rate  Speech Volume:Normal  Handedness:Right   Mood and Affect  Mood:Anxious; Depressed  Affect:Congruent   Thought Process  Thought Processes:Coherent; Goal Directed  Descriptions of Associations:Intact  Orientation:Full (Time, Place and Person)  Thought Content:Logical  History of Schizophrenia/Schizoaffective disorder:No data recorded Duration of Psychotic Symptoms:No data recorded Hallucinations:Hallucinations: None  Ideas of Reference:None  Suicidal Thoughts:Suicidal Thoughts: Yes, Passive  Homicidal Thoughts:Homicidal Thoughts: No   Sensorium  Memory:Immediate Good; Recent Good; Remote Good  Judgment:Intact  Insight:Fair   Executive Functions  Concentration:Good  Attention Span:Good  Shafer of Knowledge:Good  Language:Good   Psychomotor Activity  Psychomotor Activity:Psychomotor  Activity: Increased   Assets  Assets:Communication Skills; Desire for Improvement; Financial Resources/Insurance; Resilience; Social Support   Sleep  Sleep:Sleep: Poor    Physical Exam: Physical Exam ROS Blood pressure (!) 161/121, pulse 93, temperature 97.8 F (36.6 C), temperature source Oral, SpO2 95 %. There is no height or weight on file to calculate BMI.   COGNITIVE FEATURES THAT CONTRIBUTE TO RISK:  Thought constriction (tunnel vision)    SUICIDE RISK:   Moderate:  Frequent suicidal ideation with limited intensity, and duration, some specificity in terms of plans, no associated intent, good self-control, limited dysphoria/symptomatology, some risk factors present, and identifiable protective factors, including available and accessible social support.  PLAN OF CARE: Continue inpatient admission, see H&P for details.   I certify that inpatient services furnished can reasonably be expected to improve the patient's condition.   [name], MD 06/07/2021, 1:36 PM

## 2021-06-07 NOTE — Progress Notes (Signed)
Pt shares that things have been overwhelming. Pt states he was trying to get into section 8 housing. Pt says he filled out the application and paid the fee only to be told he has a felony on his record. Pt states he told them he does not, he only has a misdemeanor and called his peer support to take him to the court house where he paid for a copy of his police report with his charge from 1989. Pt states he provided the section 8 housing with the report to then be told he had to pay off his debt with his tailor lot first before they could take him. Pt states he paid the $500.00 off that he owed and let them know only to then be told they could not take him because he was a risk. Pt reports this is when he contacted his peer support person to let him know that he felt like drinking d/t all that was happening. Pt's peer support (per pt) told him just hold off and wait a couple of days and 'til I'm able to come see you. Pt says he had a zoom with his provide where he shared his SI thought and thought of relapse, in which she referred him to come in to the hospital. Pt denies experiencing any SI/HI/AVH at time of assessment. Pt reports feelings of hopelessness and helplessness. Pt feels like it's just all tumbling out of control. Pt shares before coming in because his a/c unit went out that it was too hot to sleep in his tailor that he had to sleep in a chair on his porch with his head leaned on the table. Pt's skin assessment preformed upon admission. Pt has bilat abrasions on arms that are healing.

## 2021-06-07 NOTE — Progress Notes (Signed)
   06/07/21 1450  Clinical Encounter Type  Visited With Patient  Visit Type Initial;Spiritual support;Social support  Referral From Other (Comment) (rounding)  Spiritual Encounters  Spiritual Needs Emotional  Chaplain Deloria Lair introduced herself to Mr. Vossler. Daryel November educated patient on nature of pastoral care role. Chaplain Burris provided reflective listening and provided compassionate presence; offered to provide pastoral care as needed or desired.

## 2021-06-07 NOTE — BH Assessment (Signed)
PATIENT BED AVAILABLE AFTER 9:30AM  Patient is to be admitted to [facility] BMU by Dr. Weber Cooks.  Attending Physician will be Dr. Domingo Cocking.   Patient has been assigned to room 319, by Kiel.   Intake Paper Work has been signed and placed on patient chart.  ER staff is aware of the admission: [facility] ER Secretary   Dr. Leonides Schanz, ER MD  Caryl Pina Patient's Nurse  Earlie Server Patient Access.   It is documented on patient's chart that he has a legal guardian, patient reported that it might be his sister. TTS contacted patient's sister Khamarion Bjelland [phone] who reports that she has no legal documentation that says she is his legal guardian and she is unaware of anyone else that may have legal guardianship over the patient. TTS to get voluntary consent signed by the patient

## 2021-06-07 NOTE — Tx Team (Signed)
Initial Treatment Plan 06/07/2021 1:56 PM [name] RFF:[id number]    PATIENT STRESSORS: Financial difficulties Other: living situation   PATIENT STRENGTHS: Communication skills Motivation for treatment/growth   PATIENT IDENTIFIED PROBLEMS: Financial difficulty  Non-medication complaint  Poor living condition                 DISCHARGE CRITERIA:  Adequate post-discharge living arrangements Improved stabilization in mood, thinking, and/or behavior Motivation to continue treatment in a less acute level of care  PRELIMINARY DISCHARGE PLAN: Outpatient therapy Placement in alternative living arrangements  PATIENT/FAMILY INVOLVEMENT: This treatment plan has been presented to and reviewed with the patient, Ian [name], and/or family member.  The patient and family have been given the opportunity to ask questions and make suggestions.  Aleen Sells, RN 06/07/2021, 1:56 PM

## 2021-06-07 NOTE — ED Notes (Signed)
VOL/ Admit BMU after 9am 6/17

## 2021-06-08 LAB — LIPID PANEL
Cholesterol: 133 mg/dL (ref 0–200)
HDL: 24 mg/dL — ABNORMAL LOW (ref >40)
LDL Cholesterol: 64 mg/dL (ref 0–99)
Total CHOL/HDL Ratio: 5.5 RATIO
Triglycerides: 225 mg/dL — ABNORMAL HIGH (ref <150)
VLDL: 45 mg/dL — ABNORMAL HIGH (ref 0–40)

## 2021-06-08 NOTE — Progress Notes (Signed)
[facility] MD Progress Note  06/08/2021 2:08 PM Ian [name]  MRN:  [id number]  Principal Problem: Severe recurrent major depression without psychotic features [facility]) Diagnosis: Principal Problem:   Severe recurrent major depression without psychotic features ([place]) Active Problems:   HTN (hypertension)   Dyslipidemia   Generalized anxiety disorder   Type 2 diabetes mellitus with hyperglycemia ([place])  Total Time spent with patient: 15 minutes  [name] is a  59y.o. male that has a previous psychiatric history of depression who presents to the [facility] unit for treatment of depression and suicidal ideations.  Interval History Patient was seen today for re-evaluation.  Nursing reports no events overnight. The patient has no issues with performing ADLs.  Patient has been medication compliant.    Subjective:  On assessment patient reports "I am here again". Reports worsened depression and suicidal ideations due to hopelessness in settings of not being able to get into section 8 housing for long time. Reports he was non-compliant with medications, that worsened depression too. Medications restarted upon admission, reports feeling slightly-better due to having some rest here in the hospital Still depressed due to life situation. Feeling safe from harming self in the hospital. He denies thoughts of harming self, others; denies hallucinations, does not express delusions. No side effects from current medications. He hopes that hospital SW can guide him regarding what else could be done to improve his housing situation.   Labs: no new results for review.   Past Psychiatric History: see H&P  Past Medical History:  Past Medical History:  Diagnosis Date  . Anxiety   . Barrett esophagus   . Cancer ([place])   . COPD (chronic obstructive pulmonary disease) ([place])   . Coronary artery disease   . Depression   . GERD (gastroesophageal reflux disease)   . Hypertension   . Pre-diabetes   . Stroke [facility]) 2014    "mini-stroke" per patient    Past Surgical History:  Procedure Laterality Date  . ANGIOPLASTY    . APPENDECTOMY    . CARDIAC CATHETERIZATION    . CHOLECYSTECTOMY    . WRIST SURGERY Right    age 60   Family History:  Family History  Problem Relation Age of Onset  . Stroke Father   . Leukemia Father   . Breast cancer Sister    Family Psychiatric  History: see H&P Social History:  Social History   Substance and Sexual Activity  Alcohol Use Not Currently     Social History   Substance and Sexual Activity  Drug Use Yes  . Types: Cocaine, Marijuana    Social History   Socioeconomic History  . Marital status: Divorced    Spouse name: Not on file  . Number of children: Not on file  . Years of education: Not on file  . Highest education level: Not on file  Occupational History  . Not on file  Tobacco Use  . Smoking status: Every Day    Packs/day: 1.50    Pack years: 0.00    Types: Cigarettes  . Smokeless tobacco: Never  Vaping Use  . Vaping Use: Never used  Substance and Sexual Activity  . Alcohol use: Not Currently  . Drug use: Yes    Types: Cocaine, Marijuana  . Sexual activity: Not Currently  Other Topics Concern  . Not on file  Social History Narrative  . Not on file   Social Determinants of Health   Financial Resource Strain: Not on file  Food Insecurity: Not on  file  Transportation Needs: Not on file  Physical Activity: Not on file  Stress: Not on file  Social Connections: Not on file   Additional Social History:                         Sleep: Good  Appetite:  Good  Current Medications: Current Facility-Administered Medications  Medication Dose Route Frequency Provider Last Rate Last Admin  . acetaminophen (TYLENOL) tablet 650 mg  650 mg Oral Q6H PRN [name], [name], MD   650 mg at 06/08/21 0755  . albuterol (VENTOLIN HFA) 108 (90 Base) MCG/ACT inhaler 2 puff  2 puff Inhalation Q6H PRN [name], [name], MD      . alum & mag  hydroxide-simeth (MAALOX/MYLANTA) 200-200-20 MG/5ML suspension 30 mL  30 mL Oral Q4H PRN [name], [name], MD      . amLODipine (NORVASC) tablet 5 mg  5 mg Oral Daily [name], [name], MD   5 mg at 06/08/21 0756  . aspirin EC tablet 81 mg  81 mg Oral Daily [name], MD   81 mg at 06/08/21 0756  . atorvastatin (LIPITOR) tablet 40 mg  40 mg Oral Daily [name], [name], MD   40 mg at 06/08/21 0755  . busPIRone (BUSPAR) tablet 20 mg  20 mg Oral TID [name], [name], MD   20 mg at 06/08/21 1207  . clonazePAM (KLONOPIN) tablet 0.5 mg  0.5 mg Oral BID PRN [name], [name], MD   0.5 mg at 06/07/21 1642  . escitalopram (LEXAPRO) tablet 20 mg  20 mg Oral Daily [name], [name], MD   20 mg at 06/08/21 0755  . hydrALAZINE (APRESOLINE) tablet 10 mg  10 mg Oral Q6H PRN [name], MD   10 mg at 06/07/21 1404  . hydrochlorothiazide (HYDRODIURIL) tablet 25 mg  25 mg Oral Daily [name], [name], MD   25 mg at 06/07/21 2141  . hydrOXYzine (ATARAX/VISTARIL) tablet 25 mg  25 mg Oral TID PRN [name], MD   25 mg at 06/07/21 1408  . lisinopril (ZESTRIL) tablet 40 mg  40 mg Oral Daily [name], [name], MD   40 mg at 06/07/21 2143  . magnesium hydroxide (MILK OF MAGNESIA) suspension 30 mL  30 mL Oral Daily PRN [name], [name], MD      . metFORMIN (GLUCOPHAGE) tablet 1,000 mg  1,000 mg Oral BID WC [name], [name], MD   1,000 mg at 06/08/21 0756  . mometasone-formoterol (DULERA) 200-5 MCG/ACT inhaler 2 puff  2 puff Inhalation BID [name], [name], MD   2 puff at 06/08/21 1208  . nicotine (NICODERM CQ - dosed in mg/24 hours) patch 21 mg  21 mg Transdermal Daily [name], [name], MD   21 mg at 06/08/21 0757  . pantoprazole (PROTONIX) EC tablet 40 mg  40 mg Oral Daily [name], [name], MD   40 mg at 06/08/21 0756  . QUEtiapine (SEROQUEL XR) 24 hr tablet 400 mg  400 mg Oral QHS [name], [name], MD   400 mg at 06/07/21 2139  . zolpidem (AMBIEN) tablet 10 mg  10 mg Oral QHS [name], MD   10 mg at 06/07/21 2139     Lab Results:  Results for orders placed or performed during the hospital encounter of 06/07/21 (from the past 48 hour(s))  Lipid panel     Status: Abnormal   Collection Time: 06/08/21  6:36  AM  Result Value Ref Range   Cholesterol 133 0 - 200 mg/dL   Triglycerides 225 (H) <150 mg/dL   HDL 24 (L) >40 mg/dL   Total CHOL/HDL Ratio 5.5 RATIO   VLDL 45 (H) 0 - 40 mg/dL   LDL Cholesterol 64 0 - 99 mg/dL    Comment:        Total Cholesterol/HDL:CHD Risk Coronary Heart Disease Risk Table                     Men   Women  1/2 Average Risk   3.4   3.3  Average Risk       5.0   4.4  2 X Average Risk   9.6   7.1  3 X Average Risk  23.4   11.0        Use the calculated Patient Ratio above and the CHD Risk Table to determine the patient's CHD Risk.        ATP III CLASSIFICATION (LDL):  <100     mg/dL   Optimal  100-129  mg/dL   Near or Above                    Optimal  130-159  mg/dL   Borderline  160-189  mg/dL   High  >190     mg/dL   Very High Performed at [facility], [address] [place], [place] [address]     Blood Alcohol level:  Lab Results  Component Value Date   [facility] <10 06/06/2021   ETH <10 94/70/9628    Metabolic Disorder Labs: Lab Results  Component Value Date   HGBA1C 7.4 (H) 01/04/2021   MPG 165.68 01/04/2021   MPG 165.68 08/26/2019   No results found for: PROLACTIN Lab Results  Component Value Date   CHOL 133 06/08/2021   TRIG 225 (H) 06/08/2021   HDL 24 (L) 06/08/2021   CHOLHDL 5.5 06/08/2021   VLDL 45 (H) 06/08/2021   LDLCALC 64 06/08/2021   LDLCALC 80 08/26/2019    Physical Findings: AIMS:  , ,  ,  ,    CIWA:    COWS:     Musculoskeletal: Strength & Muscle Tone: within normal limits Gait & Station: normal Patient leans: N/A  Psychiatric Specialty Exam: Physical Exam  Review of Systems  Blood pressure 97/69, pulse 93, temperature 97.8 F (36.6 C), temperature source Oral, resp. rate 19, height 6' (1.829 m), weight 131.1  kg, SpO2 93 %.Body mass index is 39.2 kg/m.  General Appearance: Casual  Eye Contact:  Good  Speech:  Clear and Coherent and Normal Rate  Volume:  Normal  Mood:  Depressed  Affect:  Congruent and Constricted  Thought Process:  Coherent, Goal Directed and Linear  Orientation:  Full (Time, Place, and Person)  Thought Content:  Logical  Suicidal Thoughts:  No  Homicidal Thoughts:  No  Memory:  Immediate;   Fair Recent;   Fair Remote;   Fair  Judgement:  Fair  Insight:  Fair  Psychomotor Activity:  Normal  Concentration:  Concentration: Fair and Attention Span: Fair  Recall:  [employer] of Knowledge:  Fair  Language:  Fair  Akathisia:  No  Handed:  Right  AIMS (if indicated):     Assets:  Communication Skills Desire for Improvement Resilience Social Support  ADL's:  Intact  Cognition:  WNL  Sleep:  Number of Hours: 6.25     Treatment Plan Summary:  Daily contact with patient to assess and evaluate symptoms and progress in treatment and Medication management    Patient is a 60 year old male with the above-stated past psychiatric and medical history who was admitted to [facility] unit secondary to worsened depression and suicidal ideations.  Today continues to reports feeling depressed and anxious due to unstable housing situation; denies active suicidal thoughts, plans. Sleep improved. Patient is hopeful and oriented for future. All medications were just restarted. No med changes today.   Plan:  -continue inpatient psych admission; 15-minute checks; daily contact with patient to assess and evaluate symptoms and progress in treatment; psychoeducation.  -continue scheduled medications: . amLODipine  5 mg Oral Daily  . aspirin EC  81 mg Oral Daily  . atorvastatin  40 mg Oral Daily  . busPIRone  20 mg Oral TID  . escitalopram  20 mg Oral Daily  . hydrochlorothiazide  25 mg Oral Daily  . lisinopril  40 mg Oral Daily  . metFORMIN  1,000 mg Oral BID WC  . mometasone-formoterol  2  puff Inhalation BID  . nicotine  21 mg Transdermal Daily  . pantoprazole  40 mg Oral Daily  . QUEtiapine  400 mg Oral QHS  . zolpidem  10 mg Oral QHS     -continue PRN medications.  acetaminophen, albuterol, alum & mag hydroxide-simeth, clonazePAM, hydrALAZINE, hydrOXYzine, magnesium hydroxide  -Pertinent Labs: Lipid panel - hyperTGemia, low HDL, high VLDL. HbA1c - pending.     -Consults: No new consults placed since yesterday    -Disposition: Estimated duration of hospitalization: possibly next week. Social work consult. All necessary aftercare will be arranged prior to discharge Likely d/c home with outpatient psych follow-up.  -  I certify that the patient does need, on a daily basis, active treatment furnished directly by or requiring the supervision of inpatient psychiatric facility personnel.   Larita Fife, MD 06/08/2021, 2:08 PM Physical Exam ROS

## 2021-06-08 NOTE — BHH Group Notes (Signed)
LCSW Group Therapy Note     06/08/2021 12:50 PM     Type of Therapy and Topic:  Group Therapy: Avoiding Self-Sabotaging and Enabling Behaviors     Participation Level:  Did Not Attend        Description of Group:   In this group, patients will learn how to identify obstacles, self-sabotaging and enabling behaviors, as well as: what are they, why do we do them and what needs these behaviors meet. Discuss unhealthy relationships and how to have positive healthy boundaries with those that sabotage and enable. Explore aspects of self-sabotage and enabling in yourself and how to limit these self-destructive behaviors in everyday life.      Therapeutic Goals:  1.                  Patient will identify one obstacle that relates to self-sabotage and enabling behaviors  2.                  Patient will identify one personal self-sabotaging or enabling behavior they did prior to admission  3.                  Patient will state a plan to change the above identified behavior  4.                  Patient will demonstrate ability to communicate their needs through discussion and/or role play.     Summary of Patient Progress: X  Therapeutic Modalities:   Cognitive Behavioral Therapy  Person-Centered Therapy  Motivational Interviewing    Hend Mccarrell [place] MSW, LCSW-A  06/08/2021 12:50 PM

## 2021-06-08 NOTE — BHH Suicide Risk Assessment (Signed)
[place] INPATIENT:  Family/Significant Other Suicide Prevention Education  Suicide Prevention Education:  Education Completed; Noland Fordyce, peer support (name of family member/significant other) has been identified by the patient as the family member/significant other with whom the patient will be residing, and identified as the person(s) who will aid the patient in the event of a mental health crisis (suicidal ideations/suicide attempt).  With written consent from the patient, the family member/significant other has been provided the following suicide prevention education, prior to the and/or following the discharge of the patient.  The suicide prevention education provided includes the following: Suicide risk factors Suicide prevention and interventions National Suicide Hotline telephone number [facility] assessment telephone number [facility] Emergency Assistance [place] and/or Residential Mobile Crisis Unit telephone number  Request made of family/significant other to: Remove weapons (e.g., guns, rifles, knives), all items previously/currently identified as safety concern.   Remove drugs/medications (over-the-counter, prescriptions, illicit drugs), all items previously/currently identified as a safety concern.  The family member/significant other verbalizes understanding of the suicide prevention education information provided.  The family member/significant other agrees to remove the items of safety concern listed above.  Ian [name] 06/08/2021, 2:58 PM

## 2021-06-08 NOTE — Progress Notes (Signed)
Patient's last BP 108/71. Patient expressed feeling anxious rated 5/10 and was given Klonopin. Will continue to monitor with 15 minute safety checks.

## 2021-06-08 NOTE — Progress Notes (Addendum)
Patient is calm and cooperative. He is medication compliant. He denies SI, HI, and AVH. He complained of generalized body soreness today from his back and neck to his extremities rating the pain 7/10. He also said that "it is hard to hold my head up". Tylenol was given and was effective. Patient's highest blood pressure was 97/69. Gatorade zero was given. Lisinopril and hydrochlorothiazide was not given. Patient is pleasant and is observed interacting safely with others on the unit. He mentioned that he did not sleep well last night and took a 3 hour nap after breakfast. Will continue to monitor with 15 minute safety checks.

## 2021-06-08 NOTE — Progress Notes (Signed)
Patient was cooperative with treatment, he was mostly in the dayroom watching t.v. in the evening with peers, he was compliant with medication regime. He still endorses depression and anxiety. His blood pressure remains elevated, he is currently in bed resting eyes closed will continue to monitor.
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
## 2021-06-09 MED ORDER — IBUPROFEN 600 MG PO TABS
600.0000 mg | ORAL_TABLET | Freq: Four times a day (QID) | ORAL | Status: DC | PRN
  Administered 2021-06-09 – 2021-06-21 (×20): 600 mg via ORAL
  Filled 2021-06-09 (×20): qty 1

## 2021-06-09 NOTE — Plan of Care (Signed)
Pt rates depression, anxiety, and hopelessness all at 5/10. Pt denies SI, HI and AVH. Pt was educated on care plan and verbalizes understanding. Collier Bullock RN Problem: Education: Goal: Knowledge of [facility] Education information/materials will improve Outcome: Progressing Goal: Emotional status will improve Outcome: Progressing Goal: Mental status will improve Outcome: Progressing Goal: Verbalization of understanding the information provided will improve Outcome: Progressing   Problem: Activity: Goal: Interest or engagement in activities will improve Outcome: Progressing Goal: Sleeping patterns will improve Outcome: Progressing   Problem: Coping: Goal: Ability to verbalize frustrations and anger appropriately will improve Outcome: Progressing Goal: Ability to demonstrate self-control will improve Outcome: Progressing   Problem: Health Behavior/Discharge Planning: Goal: Identification of resources available to assist in meeting health care needs will improve Outcome: Progressing Goal: Compliance with treatment plan for underlying cause of condition will improve Outcome: Progressing   Problem: Physical Regulation: Goal: Ability to maintain clinical measurements within normal limits will improve Outcome: Progressing   Problem: Safety: Goal: Periods of time without injury will increase Outcome: Progressing   Problem: Education: Goal: Utilization of techniques to improve thought processes will improve Outcome: Progressing Goal: Knowledge of the prescribed therapeutic regimen will improve Outcome: Progressing   Problem: Activity: Goal: Interest or engagement in leisure activities will improve Outcome: Progressing Goal: Imbalance in normal sleep/wake cycle will improve Outcome: Progressing   Problem: Coping: Goal: Coping ability will improve Outcome: Progressing Goal: Will verbalize feelings Outcome: Progressing   Problem: Health Behavior/Discharge  Planning: Goal: Ability to make decisions will improve Outcome: Progressing Goal: Compliance with therapeutic regimen will improve Outcome: Progressing   Problem: Role Relationship: Goal: Will demonstrate positive changes in social behaviors and relationships Outcome: Progressing   Problem: Safety: Goal: Ability to disclose and discuss suicidal ideas will improve Outcome: Progressing Goal: Ability to identify and utilize support systems that promote safety will improve Outcome: Progressing   Problem: Self-Concept: Goal: Will verbalize positive feelings about self Outcome: Progressing Goal: Level of anxiety will decrease Outcome: Progressing   Problem: Education: Goal: Ability to state activities that reduce stress will improve Outcome: Progressing   Problem: Coping: Goal: Ability to identify and develop effective coping behavior will improve Outcome: Progressing   Problem: Self-Concept: Goal: Ability to identify factors that promote anxiety will improve Outcome: Progressing Goal: Level of anxiety will decrease Outcome: Progressing Goal: Ability to modify response to factors that promote anxiety will improve Outcome: Progressing

## 2021-06-09 NOTE — BHH Group Notes (Signed)
[facility] Notes: (Clinical Social Work)   06/09/2021      Type of Therapy:  Group Therapy   Participation Level:  Did Not Attend - was invited individually by Nurse/MHT and chose not to attend.   Raina Mina, LCSWA 06/09/2021  1:47 PM

## 2021-06-09 NOTE — Progress Notes (Signed)
[facility] MD Progress Note  06/09/2021 12:08 PM Ian [name]  MRN:  [id number]  Principal Problem: Severe recurrent major depression without psychotic features [facility]) Diagnosis: Principal Problem:   Severe recurrent major depression without psychotic features ([place]) Active Problems:   HTN (hypertension)   Dyslipidemia   Generalized anxiety disorder   Type 2 diabetes mellitus with hyperglycemia ([place])  Total Time spent with patient: 15 minutes  Ian [name] is a  60y.o. male that has a previous psychiatric history of depression who presents to the [facility] unit for treatment of depression and suicidal ideations.  Interval History Patient was seen today for re-evaluation.  Nursing reports no events overnight. The patient has no issues with performing ADLs.  Patient has been medication compliant.    Subjective:  On assessment patient reports "I am not good";  continues to reports depressed and anxious mood in settings of current issues with housing. Reports he cannot find a solution, feeling hopeless and that suicidal ideas come to his mind. He thinks his current combination of medications working well for him. Reports some trouble sleeping, relates it to being in the hospital with it`s sounds and noises. Feeling safe from harming self in the hospital. He denies thoughts of harming self, others; denies hallucinations, does not express delusions. No side effects from current medications.    Past Psychiatric History: see H&P  Past Medical History:  Past Medical History:  Diagnosis Date  . Anxiety   . Barrett esophagus   . Cancer ([place])   . COPD (chronic obstructive pulmonary disease) ([place])   . Coronary artery disease   . Depression   . GERD (gastroesophageal reflux disease)   . Hypertension   . Pre-diabetes   . Stroke [facility]) 2014   "mini-stroke" per patient    Past Surgical History:  Procedure Laterality Date  . ANGIOPLASTY    . APPENDECTOMY    . CARDIAC CATHETERIZATION    . CHOLECYSTECTOMY     . WRIST SURGERY Right    age 37   Family History:  Family History  Problem Relation Age of Onset  . Stroke Father   . Leukemia Father   . Breast cancer Sister    Family Psychiatric  History: see H&P Social History:  Social History   Substance and Sexual Activity  Alcohol Use Not Currently     Social History   Substance and Sexual Activity  Drug Use Yes  . Types: Cocaine, Marijuana    Social History   Socioeconomic History  . Marital status: Divorced    Spouse name: Not on file  . Number of children: Not on file  . Years of education: Not on file  . Highest education level: Not on file  Occupational History  . Not on file  Tobacco Use  . Smoking status: Every Day    Packs/day: 1.50    Pack years: 0.00    Types: Cigarettes  . Smokeless tobacco: Never  Vaping Use  . Vaping Use: Never used  Substance and Sexual Activity  . Alcohol use: Not Currently  . Drug use: Yes    Types: Cocaine, Marijuana  . Sexual activity: Not Currently  Other Topics Concern  . Not on file  Social History Narrative  . Not on file   Social Determinants of Health   Financial Resource Strain: Not on file  Food Insecurity: Not on file  Transportation Needs: Not on file  Physical Activity: Not on file  Stress: Not on file  Social Connections: Not on file  Additional Social History:                         Sleep: Good  Appetite:  Good  Current Medications: Current Facility-Administered Medications  Medication Dose Route Frequency Provider Last Rate Last Admin  . acetaminophen (TYLENOL) tablet 650 mg  650 mg Oral Q6H PRN [name], [name], MD   650 mg at 06/08/21 1425  . albuterol (VENTOLIN HFA) 108 (90 Base) MCG/ACT inhaler 2 puff  2 puff Inhalation Q6H PRN [name], [name], MD      . alum & mag hydroxide-simeth (MAALOX/MYLANTA) 200-200-20 MG/5ML suspension 30 mL  30 mL Oral Q4H PRN [name], [name], MD      . amLODipine (NORVASC) tablet 5 mg  5 mg Oral Daily [name],  [name], MD   5 mg at 06/09/21 0759  . aspirin EC tablet 81 mg  81 mg Oral Daily [name], MD   81 mg at 06/09/21 0758  . atorvastatin (LIPITOR) tablet 40 mg  40 mg Oral Daily [name], [name], MD   40 mg at 06/09/21 0757  . busPIRone (BUSPAR) tablet 20 mg  20 mg Oral TID [name], [name], MD   20 mg at 06/09/21 0759  . clonazePAM (KLONOPIN) tablet 0.5 mg  0.5 mg Oral BID PRN [name], [name], MD   0.5 mg at 06/08/21 1832  . escitalopram (LEXAPRO) tablet 20 mg  20 mg Oral Daily [name], [name], MD   20 mg at 06/09/21 0758  . hydrALAZINE (APRESOLINE) tablet 10 mg  10 mg Oral Q6H PRN [name], MD   10 mg at 06/07/21 1404  . hydrochlorothiazide (HYDRODIURIL) tablet 25 mg  25 mg Oral Daily [name], [name], MD   25 mg at 06/09/21 0758  . hydrOXYzine (ATARAX/VISTARIL) tablet 25 mg  25 mg Oral TID PRN [name], MD   25 mg at 06/07/21 1408  . lisinopril (ZESTRIL) tablet 40 mg  40 mg Oral Daily [name], [name], MD   40 mg at 06/09/21 0757  . magnesium hydroxide (MILK OF MAGNESIA) suspension 30 mL  30 mL Oral Daily PRN [name], [name], MD      . metFORMIN (GLUCOPHAGE) tablet 1,000 mg  1,000 mg Oral BID WC [name], [name], MD   1,000 mg at 06/09/21 0758  . mometasone-formoterol (DULERA) 200-5 MCG/ACT inhaler 2 puff  2 puff Inhalation BID [name], [name], MD   2 puff at 06/09/21 0800  . nicotine (NICODERM CQ - dosed in mg/24 hours) patch 21 mg  21 mg Transdermal Daily [name], [name], MD   21 mg at 06/09/21 0801  . pantoprazole (PROTONIX) EC tablet 40 mg  40 mg Oral Daily [name], [name], MD   40 mg at 06/09/21 0759  . QUEtiapine (SEROQUEL XR) 24 hr tablet 400 mg  400 mg Oral QHS [name], [name], MD   400 mg at 06/08/21 2048  . zolpidem (AMBIEN) tablet 10 mg  10 mg Oral QHS [name], MD   10 mg at 06/08/21 2048    Lab Results:  Results for orders placed or performed during the hospital encounter of 06/07/21 (from the past 48 hour(s))  Lipid panel     Status: Abnormal    Collection Time: 06/08/21  6:36 AM  Result Value Ref Range   Cholesterol 133 0 - 200 mg/dL   Triglycerides 225 (H) <150 mg/dL   HDL 24 (L) >  40 mg/dL   Total CHOL/HDL Ratio 5.5 RATIO   VLDL 45 (H) 0 - 40 mg/dL   LDL Cholesterol 64 0 - 99 mg/dL    Comment:        Total Cholesterol/HDL:CHD Risk Coronary Heart Disease Risk Table                     Men   Women  1/2 Average Risk   3.4   3.3  Average Risk       5.0   4.4  2 X Average Risk   9.6   7.1  3 X Average Risk  23.4   11.0        Use the calculated Patient Ratio above and the CHD Risk Table to determine the patient's CHD Risk.        ATP III CLASSIFICATION (LDL):  <100     mg/dL   Optimal  100-129  mg/dL   Near or Above                    Optimal  130-159  mg/dL   Borderline  160-189  mg/dL   High  >190     mg/dL   Very High Performed at [facility], [address] [place], [place] [address]     Blood Alcohol level:  Lab Results  Component Value Date   [facility] <10 06/06/2021   ETH <10 55/73/2202    Metabolic Disorder Labs: Lab Results  Component Value Date   HGBA1C 7.4 (H) 01/04/2021   MPG 165.68 01/04/2021   MPG 165.68 08/26/2019   No results found for: PROLACTIN Lab Results  Component Value Date   CHOL 133 06/08/2021   TRIG 225 (H) 06/08/2021   HDL 24 (L) 06/08/2021   CHOLHDL 5.5 06/08/2021   VLDL 45 (H) 06/08/2021   LDLCALC 64 06/08/2021   LDLCALC 80 08/26/2019    Physical Findings: AIMS:  , ,  ,  ,    CIWA:    COWS:     Musculoskeletal: Strength & Muscle Tone: within normal limits Gait & Station: normal Patient leans: N/A  Psychiatric Specialty Exam: Physical Exam Vitals and nursing note reviewed.    Review of Systems  Blood pressure 123/86, pulse 92, temperature 97.8 F (36.6 C), temperature source Oral, resp. rate 18, height 6' (1.829 m), weight 131.1 kg, SpO2 92 %.Body mass index is 39.2 kg/m.  General Appearance: Casual  Eye Contact:  Good  Speech:  Clear and Coherent and  Normal Rate  Volume:  Normal  Mood:  Depressed  Affect:  Congruent and Constricted  Thought Process:  Coherent, Goal Directed and Linear  Orientation:  Full (Time, Place, and Person)  Thought Content:  Logical  Suicidal Thoughts:  No  Homicidal Thoughts:  No  Memory:  Immediate;   Fair Recent;   Fair Remote;   Fair  Judgement:  Fair  Insight:  Fair  Psychomotor Activity:  Normal  Concentration:  Concentration: Fair and Attention Span: Fair  Recall:  [employer] of Knowledge:  Fair  Language:  Fair  Akathisia:  No  Handed:  Right  AIMS (if indicated):     Assets:  Communication Skills Desire for Improvement Resilience Social Support  ADL's:  Intact  Cognition:  WNL  Sleep:  Number of Hours: 9     Treatment Plan Summary: Daily contact with patient to assess and evaluate symptoms and progress in treatment and Medication management  Patient is a 60 year old male with the above-stated past psychiatric and medical history who was admitted to [facility] unit secondary to worsened depression and suicidal ideations.  Today continues to reports feeling depressed and anxious due to unstable housing situation; denies active suicidal thoughts, plans. Sleep improved. Patient is hopeful and oriented for future. All medications were just restarted. No med changes today. HbA1c reordered.   Plan:  -continue inpatient psych admission; 15-minute checks; daily contact with patient to assess and evaluate symptoms and progress in treatment; psychoeducation.  -continue scheduled medications: . amLODipine  5 mg Oral Daily  . aspirin EC  81 mg Oral Daily  . atorvastatin  40 mg Oral Daily  . busPIRone  20 mg Oral TID  . escitalopram  20 mg Oral Daily  . hydrochlorothiazide  25 mg Oral Daily  . lisinopril  40 mg Oral Daily  . metFORMIN  1,000 mg Oral BID WC  . mometasone-formoterol  2 puff Inhalation BID  . nicotine  21 mg Transdermal Daily  . pantoprazole  40 mg Oral Daily  . QUEtiapine  400 mg  Oral QHS  . zolpidem  10 mg Oral QHS     -continue PRN medications.  acetaminophen, albuterol, alum & mag hydroxide-simeth, clonazePAM, hydrALAZINE, hydrOXYzine, magnesium hydroxide  -Pertinent Labs: Lipid panel - hyperTGemia, low HDL, high VLDL. HbA1c - not done, reordered.     -Consults: No new consults placed since yesterday    -Disposition: Estimated duration of hospitalization: possibly next week. Social work consult. All necessary aftercare will be arranged prior to discharge Likely d/c home with outpatient psych follow-up.  -  I certify that the patient does need, on a daily basis, active treatment furnished directly by or requiring the supervision of inpatient psychiatric facility personnel.   Larita Fife, MD 06/09/2021, 12:08 PM Physical Exam Vitals and nursing note reviewed.   ROS

## 2021-06-09 NOTE — Progress Notes (Signed)
Patient was calm and cooperative throughout the night.  He is medication compliant.  He denies SI, HI, and AVH.  He was in the dayroom watching TV until after snack. He requested his medication and went to bed early.  Will continue to monitor 15 minute safety checks.

## 2021-06-09 NOTE — Progress Notes (Signed)
Pt rates depression 4/10 and says "its a lot better". Pt rates anxiety 6/10. Pt did not attend groups but went outside. Pt has an anxious affect and is somewhat social in the dayroom. Collier Bullock RN

## 2021-06-09 NOTE — Plan of Care (Signed)
Patient stated that he was feeling better since he got on our unit   Problem: Education: Goal: Emotional status will improve Outcome: Progressing Goal: Mental status will improve Outcome: Progressing

## 2021-06-09 NOTE — Progress Notes (Signed)
Patient presents with anxiety, denies SI/HI/AVH. Patient stated he was feeling better than when he first got here. Patient observed interacting appropriately with staff and peers on the unit. Patient compliant with medication administration per MD orders. Pt given education, support, and encouragement to be active in his treatment plan. Patient being monitored Q 15 minutes for safety per unit protocol. Pt remains safe on the unit.

## 2021-06-10 LAB — HEMOGLOBIN A1C
Hgb A1c MFr Bld: 7.4 % — ABNORMAL HIGH (ref 4.8–5.6)
Mean Plasma Glucose: 166 mg/dL

## 2021-06-10 NOTE — Progress Notes (Signed)
D: Pt alert and oriented. Pt rates depression 6/10, hopelessness 6/10, and anxiety 6/10. Pt goal: "Find out time." Pt reports energy level as low and concentration as being poor. Pt reports sleep last night as being fair. Pt did receive medications for sleep and did find them helpful. Pt reports experiencing 5/10 chronic back/neck pain, initially refuse pain med management. Pt denies experiencing any SI/HI, or AVH at this time.   A: Scheduled medications administered to pt, per MD orders. Support and encouragement provided. Frequent verbal contact made. Routine safety checks conducted q15 minutes.   R: No adverse drug reactions noted. Pt verbally contracts for safety at this time. Pt complaint with medications and treatment plan. Pt interacts well with others on the unit. Pt remains safe at this time. Will continue to monitor.

## 2021-06-10 NOTE — [facility] Group Notes (Signed)
LCSW Group Therapy Note     06/10/2021 1:35 PM     Type of Therapy and Topic:  Group Therapy:  Overcoming Obstacles     Participation Level:  Did Not Attend     Description of Group:     In this group patients will be encouraged to explore what they see as obstacles to their own wellness and recovery. They will be guided to discuss their thoughts, feelings, and behaviors related to these obstacles. The group will process together ways to cope with barriers, with attention given to specific choices patients can make. Each patient will be challenged to identify changes they are motivated to make in order to overcome their obstacles. This group will be process-oriented, with patients participating in exploration of their own experiences as well as giving and receiving support and challenge from other group members.     Therapeutic Goals:  1.    Patient will identify personal and current obstacles as they relate to admission.  2.    Patient will identify barriers that currently interfere with their wellness or overcoming obstacles.  3.    Patient will identify feelings, thought process and behaviors related to these barriers.  4.    Patient will identify two changes they are willing to make to overcome these obstacles:        Summary of Patient Progress:  X   Therapeutic Modalities:    Cognitive Behavioral Therapy  Solution Focused Therapy  Motivational Interviewing  Relapse Prevention Therapy     Karessa Onorato [place], MSW, [place]  06/10/2021 1:35 PM

## 2021-06-10 NOTE — Progress Notes (Signed)
Recreation Therapy Notes  Date: 06/10/2021  Time: 10:15 am   Location: Craft room     Behavioral response: N/A   Intervention Topic: Wellness   Discussion/Intervention: Patient did not attend group.   Clinical Observations/Feedback:  Patient did not attend group.   Marquinn Meschke LRT/CTRS         Teryn Boerema 06/10/2021 10:44 AM

## 2021-06-10 NOTE — Plan of Care (Signed)
Patient stated he was feeling better but still had some anxiety about his residence   Problem: Education: Goal: Emotional status will improve Outcome: Progressing Goal: Mental status will improve Outcome: Progressing

## 2021-06-10 NOTE — Progress Notes (Signed)
Recreation Therapy Notes  INPATIENT RECREATION TR PLAN  Patient Details Name: Latoya Diskin MRN: [id number] DOB: August 09, 1961 Today's Date: 06/10/2021  Rec Therapy Plan Is patient appropriate for Therapeutic Recreation?: Yes Treatment times per week: at least 3 Estimated Length of Stay: 5-7 days TR Treatment/Interventions: Group participation (Comment)  Discharge Criteria Pt will be discharged from therapy if:: Discharged Treatment plan/goals/alternatives discussed and agreed upon by:: Patient/family  Discharge Summary     Kanita Delage 06/10/2021, 2:43 PM

## 2021-06-10 NOTE — Tx Team (Addendum)
Interdisciplinary Treatment and Diagnostic Plan Update  06/10/2021 Time of Session: 9:00 AM Ian [name] MRN: [id number]  Principal Diagnosis: Severe recurrent major depression without psychotic features [facility])  Secondary Diagnoses: Principal Problem:   Severe recurrent major depression without psychotic features ([place]) Active Problems:   HTN (hypertension)   Dyslipidemia   Generalized anxiety disorder   Type 2 diabetes mellitus with hyperglycemia ([place])   Current Medications:  Current Facility-Administered Medications  Medication Dose Route Frequency Provider Last Rate Last Admin   albuterol (VENTOLIN HFA) 108 (90 Base) MCG/ACT inhaler 2 puff  2 puff Inhalation Q6H PRN [name], [name], MD   2 puff at 06/09/21 2135   alum & mag hydroxide-simeth (MAALOX/MYLANTA) 200-200-20 MG/5ML suspension 30 mL  30 mL Oral Q4H PRN [name], [name], MD       amLODipine (NORVASC) tablet 5 mg  5 mg Oral Daily [name], [name], MD   5 mg at 06/10/21 0818   aspirin EC tablet 81 mg  81 mg Oral Daily [name], MD   81 mg at 06/10/21 0810   atorvastatin (LIPITOR) tablet 40 mg  40 mg Oral Daily [name], [name], MD   40 mg at 06/10/21 0810   busPIRone (BUSPAR) tablet 20 mg  20 mg Oral TID [name], [name], MD   20 mg at 06/10/21 0810   clonazePAM (KLONOPIN) tablet 0.5 mg  0.5 mg Oral BID PRN [name], [name], MD   0.5 mg at 06/08/21 1832   escitalopram (LEXAPRO) tablet 20 mg  20 mg Oral Daily [name], [name], MD   20 mg at 06/10/21 0810   hydrALAZINE (APRESOLINE) tablet 10 mg  10 mg Oral Q6H PRN [name], MD   10 mg at 06/07/21 1404   hydrochlorothiazide (HYDRODIURIL) tablet 25 mg  25 mg Oral Daily [name], [name], MD   25 mg at 06/10/21 0817   hydrOXYzine (ATARAX/VISTARIL) tablet 25 mg  25 mg Oral TID PRN [name], MD   25 mg at 06/07/21 1408   ibuprofen (ADVIL) tablet 600 mg  600 mg Oral Q6H PRN Larita Fife, MD   600 mg at 06/09/21 1248   lisinopril (ZESTRIL) tablet 40 mg  40 mg Oral  Daily [name], [name], MD   40 mg at 06/10/21 2585   magnesium hydroxide (MILK OF MAGNESIA) suspension 30 mL  30 mL Oral Daily PRN [name], [name], MD       metFORMIN (GLUCOPHAGE) tablet 1,000 mg  1,000 mg Oral BID WC [name], [name], MD   1,000 mg at 06/10/21 0810   mometasone-formoterol (DULERA) 200-5 MCG/ACT inhaler 2 puff  2 puff Inhalation BID [name], [name], MD   2 puff at 06/10/21 0809   nicotine (NICODERM CQ - dosed in mg/24 hours) patch 21 mg  21 mg Transdermal Daily [name], [name], MD   21 mg at 06/10/21 0810   pantoprazole (PROTONIX) EC tablet 40 mg  40 mg Oral Daily [name], [name], MD   40 mg at 06/10/21 0809   QUEtiapine (SEROQUEL XR) 24 hr tablet 400 mg  400 mg Oral QHS [name], [name], MD   400 mg at 06/09/21 2134   zolpidem (AMBIEN) tablet 10 mg  10 mg Oral QHS [name], MD   10 mg at 06/09/21 2134   PTA Medications: Medications Prior to Admission  Medication Sig Dispense Refill Last Dose   ADVAIR DISKUS 500-50 MCG/DOSE AEPB Inhale 1 puff into the lungs  2 (two) times daily.      albuterol (VENTOLIN HFA) 108 (90 Base) MCG/ACT inhaler Inhale 2 puffs into the lungs every 6 (six) hours as needed for wheezing or shortness of breath. 18 g 1    amLODipine (NORVASC) 5 MG tablet Take 1 tablet (5 mg total) by mouth daily. 30 tablet 1    aspirin 81 MG chewable tablet Chew 1 tablet (81 mg total) by mouth daily. (Patient not taking: Reported on 06/06/2021) 30 tablet 1    atorvastatin (LIPITOR) 40 MG tablet Take 1 tablet (40 mg total) by mouth daily. 30 tablet 1    busPIRone (BUSPAR) 10 MG tablet Take 2 tablets (20 mg total) by mouth 3 (three) times daily. 180 tablet 1    clonazePAM (KLONOPIN) 0.5 MG tablet Take 1 tablet (0.5 mg total) by mouth 2 (two) times daily as needed (Anxiety). 60 tablet 1    escitalopram (LEXAPRO) 20 MG tablet Take 1 tablet (20 mg total) by mouth daily. 30 tablet 1    hydrochlorothiazide (HYDRODIURIL) 25 MG tablet Take 1 tablet (25 mg total) by mouth daily. 30  tablet 1    hydrOXYzine (ATARAX/VISTARIL) 25 MG tablet Take 1 tablet (25 mg total) by mouth 3 (three) times daily as needed for anxiety. (Patient not taking: Reported on 06/06/2021) 90 tablet 1    lisinopril (ZESTRIL) 40 MG tablet Take 1 tablet (40 mg total) by mouth daily. 30 tablet 1    metFORMIN (FORTAMET) 1000 MG (OSM) 24 hr tablet Take 1 tablet (1,000 mg total) by mouth 2 (two) times daily with a meal. (Patient not taking: Reported on 06/06/2021) 60 tablet 1    metFORMIN (GLUCOPHAGE-XR) 500 MG 24 hr tablet Take 1,000 mg by mouth 2 (two) times daily.      mometasone-formoterol (DULERA) 200-5 MCG/ACT AERO Inhale 2 puffs into the lungs 2 (two) times daily. (Patient not taking: Reported on 06/06/2021) 1 each 1    pantoprazole (PROTONIX) 40 MG tablet Take 1 tablet (40 mg total) by mouth daily. 30 tablet 1    QUEtiapine (SEROQUEL XR) 400 MG 24 hr tablet Take 1 tablet (400 mg total) by mouth at bedtime. 30 tablet 1    zolpidem (AMBIEN) 10 MG tablet Take 1 tablet (10 mg total) by mouth at bedtime as needed for sleep. 30 tablet 1     Patient Stressors: Financial difficulties Other: living situation  Patient Strengths: Agricultural engineer for treatment/growth  Treatment Modalities: Medication Management, Group therapy, Case management,  1 to 1 session with clinician, Psychoeducation, Recreational therapy.   Physician Treatment Plan for Primary Diagnosis: Severe recurrent major depression without psychotic features ([place]) Long Term Goal(s): Improvement in symptoms so as ready for discharge   Short Term Goals: Ability to identify changes in lifestyle to reduce recurrence of condition will improve Ability to verbalize feelings will improve Ability to disclose and discuss suicidal ideas Ability to demonstrate self-control will improve Ability to identify and develop effective coping behaviors will improve Ability to maintain clinical measurements within normal limits will  improve Compliance with prescribed medications will improve Ability to identify triggers associated with substance abuse/mental health issues will improve  Medication Management: Evaluate patient's response, side effects, and tolerance of medication regimen.  Therapeutic Interventions: 1 to 1 sessions, Unit Group sessions and Medication administration.  Evaluation of Outcomes: Not Met  Physician Treatment Plan for Secondary Diagnosis: Principal Problem:   Severe recurrent major depression without psychotic features ([place]) Active Problems:   HTN (hypertension)   Dyslipidemia  Generalized anxiety disorder   Type 2 diabetes mellitus with hyperglycemia ([facility])  Long Term Goal(s): Improvement in symptoms so as ready for discharge   Short Term Goals: Ability to identify changes in lifestyle to reduce recurrence of condition will improve Ability to verbalize feelings will improve Ability to disclose and discuss suicidal ideas Ability to demonstrate self-control will improve Ability to identify and develop effective coping behaviors will improve Ability to maintain clinical measurements within normal limits will improve Compliance with prescribed medications will improve Ability to identify triggers associated with substance abuse/mental health issues will improve     Medication Management: Evaluate patient's response, side effects, and tolerance of medication regimen.  Therapeutic Interventions: 1 to 1 sessions, Unit Group sessions and Medication administration.  Evaluation of Outcomes: Not Met   RN Treatment Plan for Primary Diagnosis: Severe recurrent major depression without psychotic features ([place]) Long Term Goal(s): Knowledge of disease and therapeutic regimen to maintain health will improve  Short Term Goals: Ability to remain free from injury will improve, Ability to verbalize frustration and anger appropriately will improve, Ability to verbalize feelings will improve, Ability to  disclose and discuss suicidal ideas, Ability to identify and develop effective coping behaviors will improve, and Compliance with prescribed medications will improve  Medication Management: RN will administer medications as ordered by provider, will assess and evaluate patient's response and provide education to patient for prescribed medication. RN will report any adverse and/or side effects to prescribing provider.  Therapeutic Interventions: 1 on 1 counseling sessions, Psychoeducation, Medication administration, Evaluate responses to treatment, Monitor vital signs and CBGs as ordered, Perform/monitor CIWA, COWS, AIMS and Fall Risk screenings as ordered, Perform wound care treatments as ordered.  Evaluation of Outcomes: Not Met   LCSW Treatment Plan for Primary Diagnosis: Severe recurrent major depression without psychotic features ([place]) Long Term Goal(s): Safe transition to appropriate next level of care at discharge, Engage patient in therapeutic group addressing interpersonal concerns.  Short Term Goals: Engage patient in aftercare planning with referrals and resources, Increase social support, Increase emotional regulation, Identify triggers associated with mental health/substance abuse issues, and Increase skills for wellness and recovery  Therapeutic Interventions: Assess for all discharge needs, 1 to 1 time with Social worker, Explore available resources and support systems, Assess for adequacy in community support network, Educate family and significant other(s) on suicide prevention, Complete Psychosocial Assessment, Interpersonal group therapy.  Evaluation of Outcomes: Not Met   Progress in Treatment: Attending groups: No. Participating in groups: No. Taking medication as prescribed: Yes. Toleration medication: Yes. Family/Significant other contact made: Yes, individual(s) contacted:  Noland Fordyce, peer support specialist Patient understands diagnosis: Yes. Discussing patient  identified problems/goals with staff: Yes. Medical problems stabilized or resolved: Yes. Denies suicidal/homicidal ideation: Yes. Issues/concerns per patient self-inventory: No. Other: None.  New problem(s) identified: No, Describe:  none.  New Short Term/Long Term Goal(s): medication management for mood stabilization; elimination of SI thoughts; development of comprehensive mental wellness plan.  Patient Goals: "A place stay on [address]   Discharge Plan or Barriers: CSW will assist with development of an appropriate aftercare/discharge plan.   Reason for Continuation of Hospitalization: Anxiety Depression Medication stabilization Suicidal ideation  Estimated Length of Stay: 1-7 days  Recreational Therapy: Patient Stressors: N/A Patient Goal: Patient will engage in groups without prompting or encouragement from LRT x3 group sessions within 5 recreation therapy group sessions  Attendees: Patient: Ian [name] 06/10/2021 9:28 AM  Physician: Selina Cooley, MD 06/10/2021 9:28 AM  Nursing: Marla Roe,  RN 06/10/2021 9:28 AM  RN Care Manager: 06/10/2021 9:28 AM  Social Worker: Chalmers Guest. Guerry Bruin, MSW, [place] 06/10/2021 9:28 AM  Recreational Therapist: Devin Going, LRT  06/10/2021 9:28 AM  Other:  06/10/2021 9:28 AM  Other:  06/10/2021 9:28 AM  Other: 06/10/2021 9:28 AM    Scribe for Treatment Team: Shirl Harris, LCSW 06/10/2021 9:28 AM

## 2021-06-10 NOTE — Progress Notes (Signed)
Patient presents with anxiety, denies SI/HI/AVH. Patient stated he was feeling better than when he first got here. Patient observed interacting appropriately with staff and peers on the unit. Patient compliant with medication administration per MD orders. Pt given education, support, and encouragement to be active in his treatment plan. Patient being monitored Q 15 minutes for safety per unit protocol. Pt remains safe on the unit.

## 2021-06-10 NOTE — Progress Notes (Addendum)
Recreation Therapy Notes  INPATIENT RECREATION THERAPY ASSESSMENT  Patient Details Name: Ian [name] MRN: [id number] DOB: 07/05/1961 Today's Date: 06/10/2021       Information Obtained From: Patient  Able to Participate in Assessment/Interview: Yes  Patient Presentation: Responsive  Reason for Admission (Per Patient): Active Symptoms, Suicidal Ideation  Patient Stressors: Other (Comment) (Housing, Money)  Coping Skills:   Other (Comment) (None)  Leisure Interests (2+):   (None)  Frequency of Recreation/Participation:    Awareness of Community Resources:  No  Community Resources:     Current Use:    If no, Barriers?:    Expressed Interest in [name] Information: No  County of Residence:  [place]  Patient Main Form of Transportation: Diplomatic Services operational officer  Patient Strengths:  None  Patient Identified Areas of Improvement:  Living situation  Patient Goal for Hospitalization:  To find a place to stay  Current SI (including self-harm):  Yes (No plan)  Current HI:  No  Current AVH: No  Staff Intervention Plan: Group Attendance, Collaborate with Interdisciplinary Treatment Team  Consent to Intern Participation: N/A  Ian [name] 06/10/2021, 2:42 PM

## 2021-06-10 NOTE — Progress Notes (Signed)
[facility] MD Progress Note  06/10/2021 1:00 PM Ian [name]  MRN:  [id number]  Principal Problem: Severe recurrent major depression without psychotic features [facility]) Diagnosis: Principal Problem:   Severe recurrent major depression without psychotic features ([place]) Active Problems:   HTN (hypertension)   Dyslipidemia   Generalized anxiety disorder   Type 2 diabetes mellitus with hyperglycemia ([place])  Total Time spent with patient: 15 minutes  CC "Just hopeless."  Ian [name] is a  59y.o. male that has a previous psychiatric history of depression who presents to the [facility] unit for treatment of depression and suicidal ideations.  Interval History Patient was seen today for re-evaluation.  Nursing reports no events overnight. The patient has no issues with performing ADLs.  Patient has been medication compliant.    Subjective: Patient seen during treatment team and again one-on-one.  He states that he is feeling very hopeless today and has a poor appetite.  He does feel little bit lightheaded as well.  He is vaguely suicidal without plan.  He denies any homicidal ideations, visual hallucinations, auditory hallucinations.  He has been medication compliant without side effects.  He c continues to perseverate on lack of housing and food.  He does have a section 8 voucher, but has failed to secure placement.   Past Psychiatric History: see H&P  Past Medical History:  Past Medical History:  Diagnosis Date  . Anxiety   . Barrett esophagus   . Cancer ([place])   . COPD (chronic obstructive pulmonary disease) ([place])   . Coronary artery disease   . Depression   . GERD (gastroesophageal reflux disease)   . Hypertension   . Pre-diabetes   . Stroke [facility]) 2014   "mini-stroke" per patient    Past Surgical History:  Procedure Laterality Date  . ANGIOPLASTY    . APPENDECTOMY    . CARDIAC CATHETERIZATION    . CHOLECYSTECTOMY    . WRIST SURGERY Right    age 64   Family History:  Family History   Problem Relation Age of Onset  . Stroke Father   . Leukemia Father   . Breast cancer Sister    Family Psychiatric  History: see H&P Social History:  Social History   Substance and Sexual Activity  Alcohol Use Not Currently     Social History   Substance and Sexual Activity  Drug Use Yes  . Types: Cocaine, Marijuana    Social History   Socioeconomic History  . Marital status: Divorced    Spouse name: Not on file  . Number of children: Not on file  . Years of education: Not on file  . Highest education level: Not on file  Occupational History  . Not on file  Tobacco Use  . Smoking status: Every Day    Packs/day: 1.50    Pack years: 0.00    Types: Cigarettes  . Smokeless tobacco: Never  Vaping Use  . Vaping Use: Never used  Substance and Sexual Activity  . Alcohol use: Not Currently  . Drug use: Yes    Types: Cocaine, Marijuana  . Sexual activity: Not Currently  Other Topics Concern  . Not on file  Social History Narrative  . Not on file   Social Determinants of Health   Financial Resource Strain: Not on file  Food Insecurity: Not on file  Transportation Needs: Not on file  Physical Activity: Not on file  Stress: Not on file  Social Connections: Not on file   Additional Social History:  Sleep: Good  Appetite:  Good  Current Medications: Current Facility-Administered Medications  Medication Dose Route Frequency Provider Last Rate Last Admin  . albuterol (VENTOLIN HFA) 108 (90 Base) MCG/ACT inhaler 2 puff  2 puff Inhalation Q6H PRN [name], [name], MD   2 puff at 06/09/21 2135  . alum & mag hydroxide-simeth (MAALOX/MYLANTA) 200-200-20 MG/5ML suspension 30 mL  30 mL Oral Q4H PRN [name], [name], MD      . amLODipine (NORVASC) tablet 5 mg  5 mg Oral Daily [name], [name], MD   5 mg at 06/10/21 0818  . aspirin EC tablet 81 mg  81 mg Oral Daily [name], MD   81 mg at 06/10/21 0810  . atorvastatin (LIPITOR) tablet  40 mg  40 mg Oral Daily [name], [name], MD   40 mg at 06/10/21 0810  . busPIRone (BUSPAR) tablet 20 mg  20 mg Oral TID [name], [name], MD   20 mg at 06/10/21 1221  . clonazePAM (KLONOPIN) tablet 0.5 mg  0.5 mg Oral BID PRN [name], [name], MD   0.5 mg at 06/08/21 1832  . escitalopram (LEXAPRO) tablet 20 mg  20 mg Oral Daily [name], [name], MD   20 mg at 06/10/21 0810  . hydrALAZINE (APRESOLINE) tablet 10 mg  10 mg Oral Q6H PRN [name], MD   10 mg at 06/07/21 1404  . hydrochlorothiazide (HYDRODIURIL) tablet 25 mg  25 mg Oral Daily [name], [name], MD   25 mg at 06/10/21 0817  . hydrOXYzine (ATARAX/VISTARIL) tablet 25 mg  25 mg Oral TID PRN [name], MD   25 mg at 06/07/21 1408  . ibuprofen (ADVIL) tablet 600 mg  600 mg Oral Q6H PRN Larita Fife, MD   600 mg at 06/10/21 1221  . lisinopril (ZESTRIL) tablet 40 mg  40 mg Oral Daily [name], [name], MD   40 mg at 06/10/21 0817  . magnesium hydroxide (MILK OF MAGNESIA) suspension 30 mL  30 mL Oral Daily PRN [name], [name], MD      . metFORMIN (GLUCOPHAGE) tablet 1,000 mg  1,000 mg Oral BID WC [name], [name], MD   1,000 mg at 06/10/21 0810  . mometasone-formoterol (DULERA) 200-5 MCG/ACT inhaler 2 puff  2 puff Inhalation BID [name], [name], MD   2 puff at 06/10/21 0809  . nicotine (NICODERM CQ - dosed in mg/24 hours) patch 21 mg  21 mg Transdermal Daily [name], [name], MD   21 mg at 06/10/21 0810  . pantoprazole (PROTONIX) EC tablet 40 mg  40 mg Oral Daily [name], [name], MD   40 mg at 06/10/21 0809  . QUEtiapine (SEROQUEL XR) 24 hr tablet 400 mg  400 mg Oral QHS [name], [name], MD   400 mg at 06/09/21 2134  . zolpidem (AMBIEN) tablet 10 mg  10 mg Oral QHS [name], MD   10 mg at 06/09/21 2134    Lab Results:  No results found for this or any previous visit (from the past 48 hour(s)).   Blood Alcohol level:  Lab Results  Component Value Date   ETH <10 06/06/2021   ETH <10 30/86/5784    Metabolic Disorder  Labs: Lab Results  Component Value Date   HGBA1C 7.4 (H) 06/08/2021   MPG 166 06/08/2021   MPG 165.68 01/04/2021   No results found for: PROLACTIN Lab Results  Component Value Date   CHOL 133 06/08/2021  TRIG 225 (H) 06/08/2021   HDL 24 (L) 06/08/2021   CHOLHDL 5.5 06/08/2021   VLDL 45 (H) 06/08/2021   LDLCALC 64 06/08/2021   LDLCALC 80 08/26/2019    Physical Findings: AIMS:  , ,  ,  ,    CIWA:    COWS:     Musculoskeletal: Strength & Muscle Tone: within normal limits Gait & Station: normal Patient leans: N/A  Psychiatric Specialty Exam: Physical Exam Vitals and nursing note reviewed.    Review of Systems  Blood pressure 127/80, pulse 97, temperature 97.9 F (36.6 C), temperature source Oral, resp. rate 18, height 6' (1.829 m), weight 131.1 kg, SpO2 92 %.Body mass index is 39.2 kg/m.  General Appearance: Casual  Eye Contact:  Good  Speech:  Clear and Coherent and Normal Rate  Volume:  Normal  Mood:  Depressed  Affect:  Congruent and Constricted  Thought Process:  Coherent, Goal Directed and Linear  Orientation:  Full (Time, Place, and Person)  Thought Content:  Logical  Suicidal Thoughts:  No  Homicidal Thoughts:  No  Memory:  Immediate;   Fair Recent;   Fair Remote;   Fair  Judgement:  Fair  Insight:  Fair  Psychomotor Activity:  Normal  Concentration:  Concentration: Fair and Attention Span: Fair  Recall:  [employer] of Knowledge:  Fair  Language:  Fair  Akathisia:  No  Handed:  Right  AIMS (if indicated):     Assets:  Communication Skills Desire for Improvement Resilience Social Support  ADL's:  Intact  Cognition:  WNL  Sleep:  Number of Hours: 7     Treatment Plan Summary: Daily contact with patient to assess and evaluate symptoms and progress in treatment and Medication management    Patient is a 60 year old male with the above-stated past psychiatric and medical history who was admitted to [facility] unit secondary to worsened depression and  suicidal ideations.  Today continues to reports feeling depressed and anxious due to unstable housing situation; denies active suicidal thoughts, plans. All medications were just restarted. No med changes today. HbA1c 7.4   Plan:  -continue inpatient psych admission; 15-minute checks; daily contact with patient to assess and evaluate symptoms and progress in treatment; psychoeducation.  -continue scheduled medications: . amLODipine  5 mg Oral Daily  . aspirin EC  81 mg Oral Daily  . atorvastatin  40 mg Oral Daily  . busPIRone  20 mg Oral TID  . escitalopram  20 mg Oral Daily  . hydrochlorothiazide  25 mg Oral Daily  . lisinopril  40 mg Oral Daily  . metFORMIN  1,000 mg Oral BID WC  . mometasone-formoterol  2 puff Inhalation BID  . nicotine  21 mg Transdermal Daily  . pantoprazole  40 mg Oral Daily  . QUEtiapine  400 mg Oral QHS  . zolpidem  10 mg Oral QHS     -continue PRN medications.  albuterol, alum & mag hydroxide-simeth, clonazePAM, hydrALAZINE, hydrOXYzine, ibuprofen, magnesium hydroxide  -Pertinent Labs: hemoglobin a1c 7.4, diet counseling provided    -Consults: No new consults placed since yesterday    -Disposition: Estimated duration of hospitalization: possibly next week. Social work consult. All necessary aftercare will be arranged prior to discharge Likely d/c home with outpatient psych follow-up.  -  I certify that the patient does need, on a daily basis, active treatment furnished directly by or requiring the supervision of inpatient psychiatric facility personnel.   [name], MD 06/10/2021, 1:00 PM Physical Exam Vitals  and nursing note reviewed.   ROS

## 2021-06-11 NOTE — Progress Notes (Signed)
[facility] MD Progress Note  06/11/2021 1:14 PM Aden Sek  MRN:  [id number]  Principal Problem: Severe recurrent major depression without psychotic features [facility]) Diagnosis: Principal Problem:   Severe recurrent major depression without psychotic features ([place]) Active Problems:   HTN (hypertension)   Dyslipidemia   Generalized anxiety disorder   Type 2 diabetes mellitus with hyperglycemia ([place])  Total Time spent with patient: 15 minutes  CC "Just stressed"  [name] is a  60y.o. male that has a previous psychiatric history of depression who presents to the [facility] unit for treatment of depression and suicidal ideations.  Interval History Patient was seen today for re-evaluation.  Nursing reports no events overnight. The patient has no issues with performing ADLs.  Patient has been medication compliant.    Subjective: Patient seen one-on-one again today.  He notes that he still very depressed and anxious particularly about his housing situation.  He is passively suicidal here in the hospital and contracts for safety.  He does allude to having a desire to actively kill himself if discharged home prior to having a plan.  He denies any homicidal ideations, visual hallucinations, auditory hallucinations.  He does note that in the mornings he has been feeling lightheaded and dizzy.  Blood pressure has remained somewhat low in the morning.  We will discontinue amlodipine 5 mg and continue to monitor.   Past Psychiatric History: see H&P  Past Medical History:  Past Medical History:  Diagnosis Date  . Anxiety   . Barrett esophagus   . Cancer ([place])   . COPD (chronic obstructive pulmonary disease) ([place])   . Coronary artery disease   . Depression   . GERD (gastroesophageal reflux disease)   . Hypertension   . Pre-diabetes   . Stroke [facility]) 2014   "mini-stroke" per patient    Past Surgical History:  Procedure Laterality Date  . ANGIOPLASTY    . APPENDECTOMY    . CARDIAC CATHETERIZATION     . CHOLECYSTECTOMY    . WRIST SURGERY Right    age 55   Family History:  Family History  Problem Relation Age of Onset  . Stroke Father   . Leukemia Father   . Breast cancer Sister    Family Psychiatric  History: see H&P Social History:  Social History   Substance and Sexual Activity  Alcohol Use Not Currently     Social History   Substance and Sexual Activity  Drug Use Yes  . Types: Cocaine, Marijuana    Social History   Socioeconomic History  . Marital status: Divorced    Spouse name: Not on file  . Number of children: Not on file  . Years of education: Not on file  . Highest education level: Not on file  Occupational History  . Not on file  Tobacco Use  . Smoking status: Every Day    Packs/day: 1.50    Pack years: 0.00    Types: Cigarettes  . Smokeless tobacco: Never  Vaping Use  . Vaping Use: Never used  Substance and Sexual Activity  . Alcohol use: Not Currently  . Drug use: Yes    Types: Cocaine, Marijuana  . Sexual activity: Not Currently  Other Topics Concern  . Not on file  Social History Narrative  . Not on file   Social Determinants of Health   Financial Resource Strain: Not on file  Food Insecurity: Not on file  Transportation Needs: Not on file  Physical Activity: Not on file  Stress: Not  on file  Social Connections: Not on file   Additional Social History:                         Sleep: Good  Appetite:  Good  Current Medications: Current Facility-Administered Medications  Medication Dose Route Frequency Provider Last Rate Last Admin  . albuterol (VENTOLIN HFA) 108 (90 Base) MCG/ACT inhaler 2 puff  2 puff Inhalation Q6H PRN [name], [name], MD   2 puff at 06/11/21 1215  . alum & mag hydroxide-simeth (MAALOX/MYLANTA) 200-200-20 MG/5ML suspension 30 mL  30 mL Oral Q4H PRN [name], [name], MD      . aspirin EC tablet 81 mg  81 mg Oral Daily [name], MD   81 mg at 06/11/21 0802  . atorvastatin (LIPITOR) tablet 40  mg  40 mg Oral Daily [name], [name], MD   40 mg at 06/11/21 0803  . busPIRone (BUSPAR) tablet 20 mg  20 mg Oral TID [name], [name], MD   20 mg at 06/11/21 1215  . clonazePAM (KLONOPIN) tablet 0.5 mg  0.5 mg Oral BID PRN [name], [name], MD   0.5 mg at 06/08/21 1832  . escitalopram (LEXAPRO) tablet 20 mg  20 mg Oral Daily [name], [name], MD   20 mg at 06/11/21 0802  . hydrALAZINE (APRESOLINE) tablet 10 mg  10 mg Oral Q6H PRN [name], MD   10 mg at 06/07/21 1404  . hydrochlorothiazide (HYDRODIURIL) tablet 25 mg  25 mg Oral Daily [name], [name], MD   25 mg at 06/11/21 0802  . hydrOXYzine (ATARAX/VISTARIL) tablet 25 mg  25 mg Oral TID PRN [name], MD   25 mg at 06/07/21 1408  . ibuprofen (ADVIL) tablet 600 mg  600 mg Oral Q6H PRN Larita Fife, MD   600 mg at 06/11/21 0801  . lisinopril (ZESTRIL) tablet 40 mg  40 mg Oral Daily [name], [name], MD   40 mg at 06/11/21 0802  . magnesium hydroxide (MILK OF MAGNESIA) suspension 30 mL  30 mL Oral Daily PRN [name], [name], MD      . metFORMIN (GLUCOPHAGE) tablet 1,000 mg  1,000 mg Oral BID WC [name], [name], MD   1,000 mg at 06/11/21 0802  . mometasone-formoterol (DULERA) 200-5 MCG/ACT inhaler 2 puff  2 puff Inhalation BID [name], [name], MD   2 puff at 06/10/21 2157  . nicotine (NICODERM CQ - dosed in mg/24 hours) patch 21 mg  21 mg Transdermal Daily [name], [name], MD   21 mg at 06/11/21 0803  . pantoprazole (PROTONIX) EC tablet 40 mg  40 mg Oral Daily [name], [name], MD   40 mg at 06/11/21 0802  . QUEtiapine (SEROQUEL XR) 24 hr tablet 400 mg  400 mg Oral QHS [name], [name], MD   400 mg at 06/10/21 2156  . zolpidem (AMBIEN) tablet 10 mg  10 mg Oral QHS [name], MD   10 mg at 06/10/21 2156    Lab Results:  No results found for this or any previous visit (from the past 48 hour(s)).   Blood Alcohol level:  Lab Results  Component Value Date   ETH <10 06/06/2021   ETH <10 14/48/1856    Metabolic Disorder Labs: Lab  Results  Component Value Date   HGBA1C 7.4 (H) 06/08/2021   MPG 166 06/08/2021   MPG 165.68 01/04/2021   No results found for:  PROLACTIN Lab Results  Component Value Date   CHOL 133 06/08/2021   TRIG 225 (H) 06/08/2021   HDL 24 (L) 06/08/2021   CHOLHDL 5.5 06/08/2021   VLDL 45 (H) 06/08/2021   LDLCALC 64 06/08/2021   LDLCALC 80 08/26/2019    Physical Findings: AIMS:  , ,  ,  ,    CIWA:    COWS:     Musculoskeletal: Strength & Muscle Tone: within normal limits Gait & Station: normal Patient leans: N/A  Psychiatric Specialty Exam: Physical Exam Vitals and nursing note reviewed.    Review of Systems  Blood pressure 118/76, pulse 90, temperature 97.9 F (36.6 C), temperature source Oral, resp. rate 18, height 6' (1.829 m), weight 131.1 kg, SpO2 94 %.Body mass index is 39.2 kg/m.  General Appearance: Casual  Eye Contact:  Good  Speech:  Clear and Coherent and Normal Rate  Volume:  Normal  Mood:  Depressed  Affect:  Congruent and Constricted  Thought Process:  Coherent, Goal Directed and Linear  Orientation:  Full (Time, Place, and Person)  Thought Content:  Logical  Suicidal Thoughts:  Yes, passive SI  Homicidal Thoughts:  No  Memory:  Immediate;   Fair Recent;   Fair Remote;   Fair  Judgement:  Fair  Insight:  Fair  Psychomotor Activity:  Normal  Concentration:  Concentration: Fair and Attention Span: Fair  Recall:  [employer] of Knowledge:  Fair  Language:  Fair  Akathisia:  No  Handed:  Right  AIMS (if indicated):     Assets:  Communication Skills Desire for Improvement Resilience Social Support  ADL's:  Intact  Cognition:  WNL  Sleep:  Number of Hours: 7.5     Treatment Plan Summary: Daily contact with patient to assess and evaluate symptoms and progress in treatment and Medication management    Patient is a 60 year old male with the above-stated past psychiatric and medical history who was admitted to [facility] unit secondary to worsened depression  and suicidal ideations.  Today continues to reports feeling depressed and anxious due to unstable housing situation; denies active suicidal thoughts, plans. All medications were just restarted. Discontinue amlodipine 5 mg daily.    Plan:  -continue inpatient psych admission; 15-minute checks; daily contact with patient to assess and evaluate symptoms and progress in treatment; psychoeducation.  -continue scheduled medications: . aspirin EC  81 mg Oral Daily  . atorvastatin  40 mg Oral Daily  . busPIRone  20 mg Oral TID  . escitalopram  20 mg Oral Daily  . hydrochlorothiazide  25 mg Oral Daily  . lisinopril  40 mg Oral Daily  . metFORMIN  1,000 mg Oral BID WC  . mometasone-formoterol  2 puff Inhalation BID  . nicotine  21 mg Transdermal Daily  . pantoprazole  40 mg Oral Daily  . QUEtiapine  400 mg Oral QHS  . zolpidem  10 mg Oral QHS     -continue PRN medications.  albuterol, alum & mag hydroxide-simeth, clonazePAM, hydrALAZINE, hydrOXYzine, ibuprofen, magnesium hydroxide  -Pertinent Labs: hemoglobin a1c 7.4, diet counseling provided    -Consults: No new consults placed since yesterday    -Disposition: Estimated duration of hospitalization: possibly next week. Social work consult. All necessary aftercare will be arranged prior to discharge Likely d/c home with outpatient psych follow-up.  -  I certify that the patient does need, on a daily basis, active treatment furnished directly by or requiring the supervision of inpatient psychiatric facility personnel.   Hedwig Morton  Domingo Cocking, MD 06/11/2021, 1:14 PM Physical Exam Vitals and nursing note reviewed.   ROS

## 2021-06-11 NOTE — Plan of Care (Signed)
Patient sated that today is the best day he had since he is here. Stated that he is working on his housing situation. Denies SI,HI and AVH. Appropriate with staff & peers. Compliant with medications. Appetite and energy level good. Support and encouragement given.

## 2021-06-11 NOTE — BHH Group Notes (Signed)
LCSW Group Therapy Note  06/11/2021 2:08 PM  Type of Therapy/Topic:  Group Therapy:  Feelings about Diagnosis  Participation Level:  Did Not Attend   Description of Group:   This group will allow patients to explore their thoughts and feelings about diagnoses they have received. Patients will be guided to explore their level of understanding and acceptance of these diagnoses. Facilitator will encourage patients to process their thoughts and feelings about the reactions of others to their diagnosis and will guide patients in identifying ways to discuss their diagnosis with significant others in their lives. This group will be process-oriented, with patients participating in exploration of their own experiences, giving and receiving support, and processing challenge from other group members.   Therapeutic Goals: Patient will demonstrate understanding of diagnosis as evidenced by identifying two or more symptoms of the disorder Patient will be able to express two feelings regarding the diagnosis Patient will demonstrate their ability to communicate their needs through discussion and/or role play  Summary of Patient Progress: X  Therapeutic Modalities:   Cognitive Behavioral Therapy Brief Therapy Feelings Identification   Adrienna Karis R. Guerry Bruin, MSW, LCSW, [place] 06/11/2021 2:08 PM

## 2021-06-11 NOTE — BHH Counselor (Signed)
CSW spoke with pt, per request. Pt inquired about his Section 8 voucher and what could be done. CSW explained that he was uncertain regarding the vouchers but discussed previous recommendations to have his peer support help him look into private landlords for housing opportunities. He agreed. Pt stated that he just could not return to his home without [facility] because it was too hot. CSW stated that he would ask/look into any potential help with the [facility]. He agreed. No other concerns expressed. Contact ended without incident.   Chalmers Guest. Guerry Bruin, MSW, LCSW, [place] 06/11/2021 4:10 PM

## 2021-06-11 NOTE — Progress Notes (Signed)
Recreation Therapy Notes  Date: 06/11/2021   Time: 10:30 am   Location: Craft room     Behavioral response: N/A   Intervention Topic: Necessities    Discussion/Intervention: Patient did not attend group.   Clinical Observations/Feedback: Patient did not attend group.   Starlee Corralejo LRT/CTRS        Herchel Hopkin 06/11/2021 11:56 AM

## 2021-06-12 NOTE — BHH Group Notes (Signed)
LCSW Group Therapy Note  06/12/2021 11:12 AM  Type of Therapy/Topic:  Group Therapy:  Emotion Regulation  Participation Level:  Did Not Attend   Description of Group:   The purpose of this group is to assist patients in learning to regulate negative emotions and experience positive emotions. Patients will be guided to discuss ways in which they have been vulnerable to their negative emotions. These vulnerabilities will be juxtaposed with experiences of positive emotions or situations, and patients will be challenged to use positive emotions to combat negative ones. Special emphasis will be placed on coping with negative emotions in conflict situations, and patients will process healthy conflict resolution skills.  Therapeutic Goals: Patient will identify two positive emotions or experiences to reflect on in order to balance out negative emotions Patient will label two or more emotions that they find the most difficult to experience Patient will demonstrate positive conflict resolution skills through discussion and/or role plays  Summary of Patient Progress: X  Therapeutic Modalities:   Cognitive Behavioral Therapy Feelings Identification [place], MSW, LCSW 06/12/2021 11:12 AM

## 2021-06-12 NOTE — Progress Notes (Signed)
[facility] MD Progress Note  06/12/2021 12:46 PM Ian [name]  MRN:  [id number]  Principal Problem: Severe recurrent major depression without psychotic features [facility]) Diagnosis: Principal Problem:   Severe recurrent major depression without psychotic features ([place]) Active Problems:   HTN (hypertension)   Dyslipidemia   Generalized anxiety disorder   Type 2 diabetes mellitus with hyperglycemia ([place])  Total Time spent with patient: 15 minutes  CC "Didn't sleep last night. "  Ian [name] is a  60y.o. male that has a previous psychiatric history of depression who presents to the [facility] unit for treatment of depression and suicidal ideations.  Interval History Patient was seen today for re-evaluation.  Nursing reports no events overnight. The patient has no issues with performing ADLs.  Patient has been medication compliant.    Subjective: Patient seen one-on-one again today.  He reports that he had tremendous difficulty falling asleep last night.  He also continues to describe some dizziness upon standing and ambulating to the day room consistent with orthostatic hypotension.  However, blood pressure was somewhat elevated this morning at 140/92.  We discussed staying hydrated to prevent further exacerbation of the symptoms.  Patient is agreeable.  He continues to have some depression and anxiety largely stemming from his housing situation.  He remains passively suicidal, but contracts for safety.  He denies any homicidal ideations, visual hallucinations, auditory hallucinations.  No medication side effects.   Past Psychiatric History: see H&P  Past Medical History:  Past Medical History:  Diagnosis Date  . Anxiety   . Barrett esophagus   . Cancer ([place])   . COPD (chronic obstructive pulmonary disease) ([place])   . Coronary artery disease   . Depression   . GERD (gastroesophageal reflux disease)   . Hypertension   . Pre-diabetes   . Stroke [facility]) 2014   "mini-stroke" per patient    Past  Surgical History:  Procedure Laterality Date  . ANGIOPLASTY    . APPENDECTOMY    . CARDIAC CATHETERIZATION    . CHOLECYSTECTOMY    . WRIST SURGERY Right    age 80   Family History:  Family History  Problem Relation Age of Onset  . Stroke Father   . Leukemia Father   . Breast cancer Sister    Family Psychiatric  History: see H&P Social History:  Social History   Substance and Sexual Activity  Alcohol Use Not Currently     Social History   Substance and Sexual Activity  Drug Use Yes  . Types: Cocaine, Marijuana    Social History   Socioeconomic History  . Marital status: Divorced    Spouse name: Not on file  . Number of children: Not on file  . Years of education: Not on file  . Highest education level: Not on file  Occupational History  . Not on file  Tobacco Use  . Smoking status: Every Day    Packs/day: 1.50    Pack years: 0.00    Types: Cigarettes  . Smokeless tobacco: Never  Vaping Use  . Vaping Use: Never used  Substance and Sexual Activity  . Alcohol use: Not Currently  . Drug use: Yes    Types: Cocaine, Marijuana  . Sexual activity: Not Currently  Other Topics Concern  . Not on file  Social History Narrative  . Not on file   Social Determinants of Health   Financial Resource Strain: Not on file  Food Insecurity: Not on file  Transportation Needs: Not on file  Physical  Activity: Not on file  Stress: Not on file  Social Connections: Not on file   Additional Social History:                         Sleep: Good  Appetite:  Good  Current Medications: Current Facility-Administered Medications  Medication Dose Route Frequency Provider Last Rate Last Admin  . albuterol (VENTOLIN HFA) 108 (90 Base) MCG/ACT inhaler 2 puff  2 puff Inhalation Q6H PRN [name], [name], MD   2 puff at 06/11/21 1215  . alum & mag hydroxide-simeth (MAALOX/MYLANTA) 200-200-20 MG/5ML suspension 30 mL  30 mL Oral Q4H PRN [name], [name], MD      . aspirin EC  tablet 81 mg  81 mg Oral Daily [name], MD   81 mg at 06/12/21 0817  . atorvastatin (LIPITOR) tablet 40 mg  40 mg Oral Daily [name], [name], MD   40 mg at 06/12/21 0817  . busPIRone (BUSPAR) tablet 20 mg  20 mg Oral TID [name], [name], MD   20 mg at 06/12/21 1237  . clonazePAM (KLONOPIN) tablet 0.5 mg  0.5 mg Oral BID PRN [name], [name], MD   0.5 mg at 06/08/21 1832  . escitalopram (LEXAPRO) tablet 20 mg  20 mg Oral Daily [name], [name], MD   20 mg at 06/12/21 0817  . hydrALAZINE (APRESOLINE) tablet 10 mg  10 mg Oral Q6H PRN [name], MD   10 mg at 06/07/21 1404  . hydrochlorothiazide (HYDRODIURIL) tablet 25 mg  25 mg Oral Daily [name], [name], MD   25 mg at 06/12/21 0817  . hydrOXYzine (ATARAX/VISTARIL) tablet 25 mg  25 mg Oral TID PRN [name], MD   25 mg at 06/07/21 1408  . ibuprofen (ADVIL) tablet 600 mg  600 mg Oral Q6H PRN Larita Fife, MD   600 mg at 06/11/21 2202  . lisinopril (ZESTRIL) tablet 40 mg  40 mg Oral Daily [name], [name], MD   40 mg at 06/12/21 0817  . magnesium hydroxide (MILK OF MAGNESIA) suspension 30 mL  30 mL Oral Daily PRN [name], [name], MD      . metFORMIN (GLUCOPHAGE) tablet 1,000 mg  1,000 mg Oral BID WC [name], [name], MD   1,000 mg at 06/12/21 0818  . mometasone-formoterol (DULERA) 200-5 MCG/ACT inhaler 2 puff  2 puff Inhalation BID [name], [name], MD   2 puff at 06/12/21 0818  . nicotine (NICODERM CQ - dosed in mg/24 hours) patch 21 mg  21 mg Transdermal Daily [name], [name], MD   21 mg at 06/12/21 0818  . pantoprazole (PROTONIX) EC tablet 40 mg  40 mg Oral Daily [name], [name], MD   40 mg at 06/12/21 0817  . QUEtiapine (SEROQUEL XR) 24 hr tablet 400 mg  400 mg Oral QHS [name], [name], MD   400 mg at 06/11/21 2244  . zolpidem (AMBIEN) tablet 10 mg  10 mg Oral QHS [name], MD   10 mg at 06/11/21 2202    Lab Results:  No results found for this or any previous visit (from the past 48 hour(s)).   Blood Alcohol level:   Lab Results  Component Value Date   ETH <10 06/06/2021   ETH <10 73/41/9379    Metabolic Disorder Labs: Lab Results  Component Value Date   HGBA1C 7.4 (H) 06/08/2021   MPG 166 06/08/2021   MPG 165.68  01/04/2021   No results found for: PROLACTIN Lab Results  Component Value Date   CHOL 133 06/08/2021   TRIG 225 (H) 06/08/2021   HDL 24 (L) 06/08/2021   CHOLHDL 5.5 06/08/2021   VLDL 45 (H) 06/08/2021   LDLCALC 64 06/08/2021   LDLCALC 80 08/26/2019    Physical Findings: AIMS:  , ,  ,  ,    CIWA:    COWS:     Musculoskeletal: Strength & Muscle Tone: within normal limits Gait & Station: normal Patient leans: N/A  Psychiatric Specialty Exam: Physical Exam Vitals and nursing note reviewed.    Review of Systems  Blood pressure (!) 140/92, pulse 87, temperature 97.9 F (36.6 C), temperature source Oral, resp. rate 18, height 6' (1.829 m), weight 131.1 kg, SpO2 95 %.Body mass index is 39.2 kg/m.  General Appearance: Casual  Eye Contact:  Good  Speech:  Clear and Coherent and Normal Rate  Volume:  Normal  Mood:  Depressed  Affect:  Congruent and Constricted  Thought Process:  Coherent, Goal Directed and Linear  Orientation:  Full (Time, Place, and Person)  Thought Content:  Logical  Suicidal Thoughts:  Yes, passive SI  Homicidal Thoughts:  No  Memory:  Immediate;   Fair Recent;   Fair Remote;   Fair  Judgement:  Fair  Insight:  Fair  Psychomotor Activity:  Normal  Concentration:  Concentration: Fair and Attention Span: Fair  Recall:  [employer] of Knowledge:  Fair  Language:  Fair  Akathisia:  No  Handed:  Right  AIMS (if indicated):     Assets:  Communication Skills Desire for Improvement Resilience Social Support  ADL's:  Intact  Cognition:  WNL  Sleep:  Number of Hours: 6     Treatment Plan Summary: Daily contact with patient to assess and evaluate symptoms and progress in treatment and Medication management    Patient is a 60 year old male  with the above-stated past psychiatric and medical history who was admitted to [facility] unit secondary to worsened depression and suicidal ideations.  Today continues to reports feeling depressed and anxious due to unstable housing situation; denies active suicidal thoughts, plans. No medication changes today.    Plan:  -continue inpatient psych admission; 15-minute checks; daily contact with patient to assess and evaluate symptoms and progress in treatment; psychoeducation.  -continue scheduled medications: . aspirin EC  81 mg Oral Daily  . atorvastatin  40 mg Oral Daily  . busPIRone  20 mg Oral TID  . escitalopram  20 mg Oral Daily  . hydrochlorothiazide  25 mg Oral Daily  . lisinopril  40 mg Oral Daily  . metFORMIN  1,000 mg Oral BID WC  . mometasone-formoterol  2 puff Inhalation BID  . nicotine  21 mg Transdermal Daily  . pantoprazole  40 mg Oral Daily  . QUEtiapine  400 mg Oral QHS  . zolpidem  10 mg Oral QHS     -continue PRN medications.  albuterol, alum & mag hydroxide-simeth, clonazePAM, hydrALAZINE, hydrOXYzine, ibuprofen, magnesium hydroxide  -Pertinent Labs: hemoglobin a1c 7.4, diet counseling provided    -Consults: No new consults placed since yesterday    -Disposition: Estimated duration of hospitalization: possibly next week. Social work consult. All necessary aftercare will be arranged prior to discharge Likely d/c home with outpatient psych follow-up.  -  I certify that the patient does need, on a daily basis, active treatment furnished directly by or requiring the supervision of inpatient psychiatric facility personnel.  06/12/21: Psychiatric exam above reviewed and remains accurate. Assessment and plan above reviewed and updated.    [name], MD 06/12/2021, 12:46 PM Physical Exam Vitals and nursing note reviewed.   ROS

## 2021-06-12 NOTE — Plan of Care (Signed)
Patient stated that he feels better than this morning. C/O pain over his back and neck that increase his anxiety. Advil and Vistaril helped with that. Denies SI,HI and AVH. Encouragd fluids for dizziness at times. Appropriate with staff & peers. ADLs maintained. Appetite and energy level good. Visible in the milieu. Support and encouragement given.

## 2021-06-13 NOTE — [facility] Group Notes (Signed)
LCSW Group Therapy Note  06/13/2021 1:49 PM  Type of Therapy/Topic:  Group Therapy:  Balance in Life  Participation Level:  Did Not Attend  Description of Group:    This group will address the concept of balance and how it feels and looks when one is unbalanced. Patients will be encouraged to process areas in their lives that are out of balance and identify reasons for remaining unbalanced. Facilitators will guide patients in utilizing problem-solving interventions to address and correct the stressor making their life unbalanced. Understanding and applying boundaries will be explored and addressed for obtaining and maintaining a balanced life. Patients will be encouraged to explore ways to assertively make their unbalanced needs known to significant others in their lives, using other group members and facilitator for support and feedback.  Therapeutic Goals: Patient will identify two or more emotions or situations they have that consume much of in their lives. Patient will identify signs/triggers that life has become out of balance:  Patient will identify two ways to set boundaries in order to achieve balance in their lives:  Patient will demonstrate ability to communicate their needs through discussion and/or role plays  Summary of Patient Progress: Patient did not attend group despite encouraged participation.    Therapeutic Modalities:   Cognitive Behavioral Therapy Solution-Focused Therapy Assertiveness Training  Paulla Dolly, MSW, [place], [place] 06/13/2021 1:49 PM

## 2021-06-13 NOTE — Plan of Care (Signed)
Pt rates depression 6/10 and anxiety and hopelessness both at 7/10. Pt denies SI, HI amd AVH. Collier Bullock RN Problem: Education: Goal: Knowledge of [facility] Education information/materials will improve Outcome: Progressing Goal: Emotional status will improve Outcome: Progressing Goal: Mental status will improve Outcome: Progressing Goal: Verbalization of understanding the information provided will improve Outcome: Progressing   Problem: Activity: Goal: Interest or engagement in activities will improve Outcome: Progressing Goal: Sleeping patterns will improve Outcome: Progressing   Problem: Coping: Goal: Ability to verbalize frustrations and anger appropriately will improve Outcome: Progressing Goal: Ability to demonstrate self-control will improve Outcome: Progressing   Problem: Health Behavior/Discharge Planning: Goal: Identification of resources available to assist in meeting health care needs will improve Outcome: Progressing Goal: Compliance with treatment plan for underlying cause of condition will improve Outcome: Progressing   Problem: Physical Regulation: Goal: Ability to maintain clinical measurements within normal limits will improve Outcome: Progressing   Problem: Safety: Goal: Periods of time without injury will increase Outcome: Progressing   Problem: Education: Goal: Utilization of techniques to improve thought processes will improve Outcome: Progressing Goal: Knowledge of the prescribed therapeutic regimen will improve Outcome: Progressing   Problem: Activity: Goal: Interest or engagement in leisure activities will improve Outcome: Progressing Goal: Imbalance in normal sleep/wake cycle will improve Outcome: Progressing   Problem: Coping: Goal: Coping ability will improve Outcome: Progressing Goal: Will verbalize feelings Outcome: Progressing   Problem: Health Behavior/Discharge Planning: Goal: Ability to make decisions will  improve Outcome: Progressing Goal: Compliance with therapeutic regimen will improve Outcome: Progressing   Problem: Role Relationship: Goal: Will demonstrate positive changes in social behaviors and relationships Outcome: Progressing   Problem: Safety: Goal: Ability to disclose and discuss suicidal ideas will improve Outcome: Progressing Goal: Ability to identify and utilize support systems that promote safety will improve Outcome: Progressing   Problem: Self-Concept: Goal: Will verbalize positive feelings about self Outcome: Progressing Goal: Level of anxiety will decrease Outcome: Progressing   Problem: Education: Goal: Ability to state activities that reduce stress will improve Outcome: Progressing   Problem: Coping: Goal: Ability to identify and develop effective coping behavior will improve Outcome: Progressing   Problem: Self-Concept: Goal: Ability to identify factors that promote anxiety will improve Outcome: Progressing Goal: Level of anxiety will decrease Outcome: Progressing Goal: Ability to modify response to factors that promote anxiety will improve Outcome: Progressing

## 2021-06-13 NOTE — Progress Notes (Signed)
[facility] MD Progress Note  06/13/2021 12:56 PM Ian [name]  MRN:  [id number]  Principal Problem: Severe recurrent major depression without psychotic features [facility]) Diagnosis: Principal Problem:   Severe recurrent major depression without psychotic features ([place]) Active Problems:   HTN (hypertension)   Dyslipidemia   Generalized anxiety disorder   Type 2 diabetes mellitus with hyperglycemia ([place])  Total Time spent with patient: 15 minutes  CC "I felt lightheaded again this morning."  Ian [name] is a  59y.o. male that has a previous psychiatric history of depression who presents to the [facility] unit for treatment of depression and suicidal ideations.  Interval History Patient was seen today for re-evaluation.  Nursing reports no events overnight. The patient has no issues with performing ADLs.  Patient has been medication compliant.    Subjective: Patient seen one-on-one again today.  Patient reports feeling dizzy and lightheaded again this morning.  Blood pressure noted to be 108/84 and all of his blood pressure medications were held.  He continues to endorse some depression, anxiety, and passive suicidal ideations.  He was able to speak with his peers support specialist who is trying to assist him with obtaining section 8 housing and finding a new AC unit for his trailer.  No other complaints.   Past Psychiatric History: see H&P  Past Medical History:  Past Medical History:  Diagnosis Date   Anxiety    Barrett esophagus    Cancer ([place])    COPD (chronic obstructive pulmonary disease) ([place])    Coronary artery disease    Depression    GERD (gastroesophageal reflux disease)    Hypertension    Pre-diabetes    Stroke ([place]) 2014   "mini-stroke" per patient    Past Surgical History:  Procedure Laterality Date   ANGIOPLASTY     APPENDECTOMY     CARDIAC CATHETERIZATION     CHOLECYSTECTOMY     WRIST SURGERY Right    age 72   Family History:  Family History  Problem Relation Age of  Onset   Stroke Father    Leukemia Father    Breast cancer Sister    Family Psychiatric  History: see H&P Social History:  Social History   Substance and Sexual Activity  Alcohol Use Not Currently     Social History   Substance and Sexual Activity  Drug Use Yes   Types: Cocaine, Marijuana    Social History   Socioeconomic History   Marital status: Divorced    Spouse name: Not on file   Number of children: Not on file   Years of education: Not on file   Highest education level: Not on file  Occupational History   Not on file  Tobacco Use   Smoking status: Every Day    Packs/day: 1.50    Pack years: 0.00    Types: Cigarettes   Smokeless tobacco: Never  Vaping Use   Vaping Use: Never used  Substance and Sexual Activity   Alcohol use: Not Currently   Drug use: Yes    Types: Cocaine, Marijuana   Sexual activity: Not Currently  Other Topics Concern   Not on file  Social History Narrative   Not on file   Social Determinants of Health   Financial Resource Strain: Not on file  Food Insecurity: Not on file  Transportation Needs: Not on file  Physical Activity: Not on file  Stress: Not on file  Social Connections: Not on file   Additional Social History:  Sleep: Good  Appetite:  Good  Current Medications: Current Facility-Administered Medications  Medication Dose Route Frequency Provider Last Rate Last Admin   albuterol (VENTOLIN HFA) 108 (90 Base) MCG/ACT inhaler 2 puff  2 puff Inhalation Q6H PRN [name], Ian T, MD   2 puff at 06/11/21 1215   alum & mag hydroxide-simeth (MAALOX/MYLANTA) 200-200-20 MG/5ML suspension 30 mL  30 mL Oral Q4H PRN [name], Ian Reno, MD       aspirin EC tablet 81 mg  81 mg Oral Daily Ian Cooley M, MD   81 mg at 06/13/21 0818   atorvastatin (LIPITOR) tablet 40 mg  40 mg Oral Daily [name], Ian T, MD   40 mg at 06/13/21 0817   busPIRone (BUSPAR) tablet 20 mg  20 mg Oral TID [name], Ian T, MD   20  mg at 06/13/21 1201   clonazePAM (KLONOPIN) tablet 0.5 mg  0.5 mg Oral BID PRN [name], Ian T, MD   0.5 mg at 06/08/21 1832   escitalopram (LEXAPRO) tablet 20 mg  20 mg Oral Daily [name], Ian T, MD   20 mg at 06/13/21 0817   hydrALAZINE (APRESOLINE) tablet 10 mg  10 mg Oral Q6H PRN Ian Scarlet, MD   10 mg at 06/07/21 1404   hydrochlorothiazide (HYDRODIURIL) tablet 25 mg  25 mg Oral Daily [name], Ian Reno, MD   25 mg at 06/12/21 0817   hydrOXYzine (ATARAX/VISTARIL) tablet 25 mg  25 mg Oral TID PRN Ian Scarlet, MD   25 mg at 06/12/21 1603   ibuprofen (ADVIL) tablet 600 mg  600 mg Oral Q6H PRN Ian Fife, MD   600 mg at 06/12/21 2153   lisinopril (ZESTRIL) tablet 40 mg  40 mg Oral Daily [name], Ian T, MD   40 mg at 06/12/21 7829   magnesium hydroxide (MILK OF MAGNESIA) suspension 30 mL  30 mL Oral Daily PRN [name], Ian T, MD       metFORMIN (GLUCOPHAGE) tablet 1,000 mg  1,000 mg Oral BID WC [name], Ian T, MD   1,000 mg at 06/13/21 0817   mometasone-formoterol (DULERA) 200-5 MCG/ACT inhaler 2 puff  2 puff Inhalation BID [name], Ian T, MD   2 puff at 06/13/21 0818   nicotine (NICODERM CQ - dosed in mg/24 hours) patch 21 mg  21 mg Transdermal Daily [name], Ian T, MD   21 mg at 06/13/21 0819   pantoprazole (PROTONIX) EC tablet 40 mg  40 mg Oral Daily [name], Ian T, MD   40 mg at 06/13/21 0818   QUEtiapine (SEROQUEL XR) 24 hr tablet 400 mg  400 mg Oral QHS [name], Ian T, MD   400 mg at 06/12/21 2151   zolpidem (AMBIEN) tablet 10 mg  10 mg Oral QHS Ian Scarlet, MD   10 mg at 06/12/21 2151    Lab Results:  No results found for this or any previous visit (from the past 48 hour(s)).   Blood Alcohol level:  Lab Results  Component Value Date   ETH <10 06/06/2021   ETH <10 56/21/3086    Metabolic Disorder Labs: Lab Results  Component Value Date   HGBA1C 7.4 (H) 06/08/2021   MPG 166 06/08/2021   MPG 165.68 01/04/2021   No results found for: PROLACTIN Lab Results   Component Value Date   CHOL 133 06/08/2021   TRIG 225 (H) 06/08/2021   HDL 24 (L) 06/08/2021   CHOLHDL 5.5 06/08/2021   VLDL 45 (H) 06/08/2021  [place] 64 06/08/2021   [place] 80 08/26/2019    Physical Findings: AIMS:  , ,  ,  ,    CIWA:    COWS:     Musculoskeletal: Strength & Muscle Tone: within normal limits Gait & Station: normal Patient leans: N/A  Psychiatric Specialty Exam: Physical Exam Vitals and nursing note reviewed.    Review of Systems  Blood pressure 108/84, pulse 91, temperature (!) 97.5 F (36.4 C), temperature source Oral, resp. rate 18, height 6' (1.829 [name]), weight 131.1 kg, SpO2 93 %.Body mass index is 39.2 kg/[name].  General Appearance: Casual  Eye Contact:  Good  Speech:  Clear and Coherent and Normal Rate  Volume:  Normal  Mood:  Depressed  Affect:  Congruent and Constricted  Thought Process:  Coherent, Goal Directed and Linear  Orientation:  Full (Time, Place, and Person)  Thought Content:  Logical  Suicidal Thoughts:  Yes, passive SI  Homicidal Thoughts:  No  Memory:  Immediate;   Fair Recent;   Fair Remote;   Fair  Judgement:  Fair  Insight:  Fair  Psychomotor Activity:  Normal  Concentration:  Concentration: Fair and Attention Span: Fair  Recall:  [employer] of Knowledge:  Fair  Language:  Fair  Akathisia:  No  Handed:  Right  AIMS (if indicated):     Assets:  Communication Skills Desire for Improvement Resilience Social Support  ADL's:  Intact  Cognition:  WNL  Sleep:  Number of Hours: 7.15     Treatment Plan Summary: Daily contact with patient to assess and evaluate symptoms and progress in treatment and Medication management    Patient is a 60 year old male with the above-stated past psychiatric and medical history who was admitted to [facility] unit secondary to worsened depression and suicidal ideations.  Today continues to reports feeling depressed and anxious due to unstable housing situation; denies active suicidal thoughts,  plans. No medication changes today.    Plan:  -continue inpatient psych admission; 15-minute checks; daily contact with patient to assess and evaluate symptoms and progress in treatment; psychoeducation.  -continue scheduled medications:  aspirin EC  81 mg Oral Daily   atorvastatin  40 mg Oral Daily   busPIRone  20 mg Oral TID   escitalopram  20 mg Oral Daily   hydrochlorothiazide  25 mg Oral Daily   lisinopril  40 mg Oral Daily   metFORMIN  1,000 mg Oral BID WC   mometasone-formoterol  2 puff Inhalation BID   nicotine  21 mg Transdermal Daily   pantoprazole  40 mg Oral Daily   QUEtiapine  400 mg Oral QHS   zolpidem  10 mg Oral QHS     -continue PRN medications.  albuterol, alum & mag hydroxide-simeth, clonazePAM, hydrALAZINE, hydrOXYzine, ibuprofen, magnesium hydroxide  -Pertinent Labs: hemoglobin a1c 7.4, diet counseling provided    -Consults: No new consults placed since yesterday    -Disposition: Estimated duration of hospitalization: possibly next week. Social work consult. All necessary aftercare will be arranged prior to discharge Likely d/c home with outpatient psych follow-up.  -  I certify that the patient does need, on a daily basis, active treatment furnished directly by or requiring the supervision of inpatient psychiatric facility personnel.   06/13/21: Psychiatric exam above reviewed and remains accurate. Assessment and plan above reviewed and updated.     Ian Scarlet, MD 06/13/2021, 12:56 PM Physical Exam Vitals and nursing note reviewed.   ROS

## 2021-06-13 NOTE — Progress Notes (Signed)
Pt rates depression 6/10, anxiety 5/10. He denies AVH but has passive SI /HI with no plan. He verbally contracts for safety. He did not attend group today. He has been somewhat social and watches tv in day room. Pt has a worried affect.He has received PRNs today for pain and anxiety. Collier Bullock RN

## 2021-06-13 NOTE — Progress Notes (Signed)
Recreation Therapy Notes  Date: 06/13/2020  Time: 9:30 am   Location: Craft room   Behavioral response: N/A   Intervention Topic: Animal Assisted therapy    Discussion/Intervention: Patient did not attend group.   Clinical Observations/Feedback:  Patient did not attend group.   Siddharth Babington LRT/CTRS        Raeghan Demeter 06/13/2021 11:48 AM

## 2021-06-14 MED ORDER — COVID-19 MRNA VAC-TRIS(PFIZER) 30 MCG/0.3ML IM SUSP
0.3000 mL | Freq: Once | INTRAMUSCULAR | Status: AC
  Administered 2021-06-14: 0.3 mL via INTRAMUSCULAR
  Filled 2021-06-14: qty 0.3

## 2021-06-14 MED ORDER — LISINOPRIL 20 MG PO TABS
20.0000 mg | ORAL_TABLET | Freq: Every day | ORAL | Status: DC
  Administered 2021-06-15 – 2021-06-21 (×7): 20 mg via ORAL
  Filled 2021-06-14 (×7): qty 1

## 2021-06-14 NOTE — [facility] Group Notes (Signed)
[facility] Group Notes:  (Nursing/MHT/Case Management/Adjunct)  Date:  06/14/2021  Time:  9:59 PM  Type of Therapy:  Group Therapy  Participation Level:  Active  Participation Quality:  Appropriate  Affect:  Appropriate  Cognitive:  Alert  Insight:  Good  Engagement in Group:  Engaged and goal is to get out of here next week.  Modes of Intervention:  Support  Summary of Progress/Problems:  Ian [name] 06/14/2021, 9:59 PM

## 2021-06-14 NOTE — Plan of Care (Signed)
Pt rates depression and hopelessness both at 7/10 and anxiety 8/10, Pt denies SI, HI and AVH. Pt was educated on care plan and verbalizes understanding. Collier Bullock RN Problem: Education: Goal: Knowledge of [facility] Education information/materials will improve Outcome: Progressing Goal: Emotional status will improve Outcome: Progressing Goal: Mental status will improve Outcome: Progressing Goal: Verbalization of understanding the information provided will improve Outcome: Progressing   Problem: Activity: Goal: Interest or engagement in activities will improve Outcome: Progressing Goal: Sleeping patterns will improve Outcome: Progressing   Problem: Coping: Goal: Ability to verbalize frustrations and anger appropriately will improve Outcome: Progressing Goal: Ability to demonstrate self-control will improve Outcome: Progressing   Problem: Health Behavior/Discharge Planning: Goal: Identification of resources available to assist in meeting health care needs will improve Outcome: Progressing Goal: Compliance with treatment plan for underlying cause of condition will improve Outcome: Progressing   Problem: Physical Regulation: Goal: Ability to maintain clinical measurements within normal limits will improve Outcome: Progressing   Problem: Safety: Goal: Periods of time without injury will increase Outcome: Progressing   Problem: Education: Goal: Utilization of techniques to improve thought processes will improve Outcome: Progressing Goal: Knowledge of the prescribed therapeutic regimen will improve Outcome: Progressing   Problem: Activity: Goal: Interest or engagement in leisure activities will improve Outcome: Progressing Goal: Imbalance in normal sleep/wake cycle will improve Outcome: Progressing   Problem: Coping: Goal: Coping ability will improve Outcome: Progressing Goal: Will verbalize feelings Outcome: Progressing   Problem: Health Behavior/Discharge  Planning: Goal: Ability to make decisions will improve Outcome: Progressing Goal: Compliance with therapeutic regimen will improve Outcome: Progressing   Problem: Role Relationship: Goal: Will demonstrate positive changes in social behaviors and relationships Outcome: Progressing   Problem: Safety: Goal: Ability to disclose and discuss suicidal ideas will improve Outcome: Progressing Goal: Ability to identify and utilize support systems that promote safety will improve Outcome: Progressing   Problem: Self-Concept: Goal: Will verbalize positive feelings about self Outcome: Progressing Goal: Level of anxiety will decrease Outcome: Progressing   Problem: Education: Goal: Ability to state activities that reduce stress will improve Outcome: Progressing   Problem: Coping: Goal: Ability to identify and develop effective coping behavior will improve Outcome: Progressing   Problem: Self-Concept: Goal: Ability to identify factors that promote anxiety will improve Outcome: Progressing Goal: Level of anxiety will decrease Outcome: Progressing Goal: Ability to modify response to factors that promote anxiety will improve Outcome: Progressing

## 2021-06-14 NOTE — Progress Notes (Signed)
Pt still rates depression and anxiety both 5/10 and denies SI, HI and AVH. Pt has not attended group. He says that he feels better today. Collier Bullock RN

## 2021-06-14 NOTE — Progress Notes (Signed)
Recreation Therapy Notes   Date: 06/14/2021  Time: 9:45 am   Location: Courtyard      Behavioral response: N/A   Intervention Topic: Leisure   Discussion/Intervention: Patient did not attend group.   Clinical Observations/Feedback:  Patient did not attend group.   Keyia Moretto LRT/CTRS         Karcyn Menn 06/14/2021 11:45 AM

## 2021-06-14 NOTE — Progress Notes (Signed)
[facility] MD Progress Note  06/14/2021 2:05 PM Ian [name]  MRN:  [id number]  Principal Problem: Severe recurrent major depression without psychotic features [facility]) Diagnosis: Principal Problem:   Severe recurrent major depression without psychotic features ([place]) Active Problems:   HTN (hypertension)   Dyslipidemia   Generalized anxiety disorder   Type 2 diabetes mellitus with hyperglycemia ([place])  Total Time spent with patient: 15 minutes  CC "I'm a little better"  [name] is a  60y.o. male that has a previous psychiatric history of depression who presents to the [facility] unit for treatment of depression and suicidal ideations.  Interval History Patient was seen today for re-evaluation.  Nursing reports no events overnight. The patient has no issues with performing ADLs.  Patient has been medication compliant.    Subjective: Patient seen one-on-one again today.  He notes that he is feeling somewhat more hopeful today.  He was able to talk with his peers support specialist yesterday and come up with a plan on different section 8 houses to apply to.  He continues to have some passive suicidal ideations, but is able to contract for safety.  He denies any homicidal ideations, visual hallucinations, auditory hallucinations.  His blood pressure medications were held yesterday and this morning due to hypotension.  Will decrease lisinopril to 20 mg daily.  We will continue to monitor patient.  Past Psychiatric History: see H&P  Past Medical History:  Past Medical History:  Diagnosis Date  . Anxiety   . Barrett esophagus   . Cancer ([place])   . COPD (chronic obstructive pulmonary disease) ([place])   . Coronary artery disease   . Depression   . GERD (gastroesophageal reflux disease)   . Hypertension   . Pre-diabetes   . Stroke [facility]) 2014   "mini-stroke" per patient    Past Surgical History:  Procedure Laterality Date  . ANGIOPLASTY    . APPENDECTOMY    . CARDIAC CATHETERIZATION    .  CHOLECYSTECTOMY    . WRIST SURGERY Right    age 38   Family History:  Family History  Problem Relation Age of Onset  . Stroke Father   . Leukemia Father   . Breast cancer Sister    Family Psychiatric  History: see H&P Social History:  Social History   Substance and Sexual Activity  Alcohol Use Not Currently     Social History   Substance and Sexual Activity  Drug Use Yes  . Types: Cocaine, Marijuana    Social History   Socioeconomic History  . Marital status: Divorced    Spouse name: Not on file  . Number of children: Not on file  . Years of education: Not on file  . Highest education level: Not on file  Occupational History  . Not on file  Tobacco Use  . Smoking status: Every Day    Packs/day: 1.50    Pack years: 0.00    Types: Cigarettes  . Smokeless tobacco: Never  Vaping Use  . Vaping Use: Never used  Substance and Sexual Activity  . Alcohol use: Not Currently  . Drug use: Yes    Types: Cocaine, Marijuana  . Sexual activity: Not Currently  Other Topics Concern  . Not on file  Social History Narrative  . Not on file   Social Determinants of Health   Financial Resource Strain: Not on file  Food Insecurity: Not on file  Transportation Needs: Not on file  Physical Activity: Not on file  Stress: Not on  file  Social Connections: Not on file   Additional Social History:                         Sleep: Good  Appetite:  Good  Current Medications: Current Facility-Administered Medications  Medication Dose Route Frequency Provider Last Rate Last Admin  . albuterol (VENTOLIN HFA) 108 (90 Base) MCG/ACT inhaler 2 puff  2 puff Inhalation Q6H PRN [name], [name], MD   2 puff at 06/13/21 2145  . alum & mag hydroxide-simeth (MAALOX/MYLANTA) 200-200-20 MG/5ML suspension 30 mL  30 mL Oral Q4H PRN [name], [name], MD      . aspirin EC tablet 81 mg  81 mg Oral Daily [name], MD   81 mg at 06/14/21 0802  . atorvastatin (LIPITOR) tablet 40 mg  40  mg Oral Daily [name], [name], MD   40 mg at 06/14/21 0801  . busPIRone (BUSPAR) tablet 20 mg  20 mg Oral TID [name], [name], MD   20 mg at 06/14/21 1224  . clonazePAM (KLONOPIN) tablet 0.5 mg  0.5 mg Oral BID PRN [name], [name], MD   0.5 mg at 06/13/21 1627  . COVID-19 mRNA Vac-TriS (Pfizer) injection 0.3 mL  0.3 mL Intramuscular Once [name], MD      . escitalopram (LEXAPRO) tablet 20 mg  20 mg Oral Daily [name], [name], MD   20 mg at 06/14/21 0802  . hydrALAZINE (APRESOLINE) tablet 10 mg  10 mg Oral Q6H PRN [name], MD   10 mg at 06/07/21 1404  . hydrochlorothiazide (HYDRODIURIL) tablet 25 mg  25 mg Oral Daily [name], [name], MD   25 mg at 06/14/21 0803  . hydrOXYzine (ATARAX/VISTARIL) tablet 25 mg  25 mg Oral TID PRN [name], MD   25 mg at 06/12/21 1603  . ibuprofen (ADVIL) tablet 600 mg  600 mg Oral Q6H PRN Larita Fife, MD   600 mg at 06/14/21 1224  . [START ON 06/15/2021] lisinopril (ZESTRIL) tablet 20 mg  20 mg Oral Daily [name], MD      . magnesium hydroxide (MILK OF MAGNESIA) suspension 30 mL  30 mL Oral Daily PRN [name], [name], MD      . metFORMIN (GLUCOPHAGE) tablet 1,000 mg  1,000 mg Oral BID WC [name], [name], MD   1,000 mg at 06/14/21 0803  . mometasone-formoterol (DULERA) 200-5 MCG/ACT inhaler 2 puff  2 puff Inhalation BID [name], [name], MD   2 puff at 06/14/21 0804  . nicotine (NICODERM CQ - dosed in mg/24 hours) patch 21 mg  21 mg Transdermal Daily [name], [name], MD   21 mg at 06/14/21 0803  . pantoprazole (PROTONIX) EC tablet 40 mg  40 mg Oral Daily [name], [name], MD   40 mg at 06/14/21 0802  . QUEtiapine (SEROQUEL XR) 24 hr tablet 400 mg  400 mg Oral QHS [name], [name], MD   400 mg at 06/13/21 2146  . zolpidem (AMBIEN) tablet 10 mg  10 mg Oral QHS [name], MD   10 mg at 06/13/21 2146    Lab Results:  No results found for this or any previous visit (from the past 48 hour(s)).   Blood Alcohol level:  Lab Results   Component Value Date   [facility] <10 06/06/2021   ETH <10 70/12/7492    Metabolic Disorder Labs: Lab Results  Component Value Date  HGBA1C 7.4 (H) 06/08/2021   MPG 166 06/08/2021   MPG 165.68 01/04/2021   No results found for: PROLACTIN Lab Results  Component Value Date   CHOL 133 06/08/2021   TRIG 225 (H) 06/08/2021   HDL 24 (L) 06/08/2021   CHOLHDL 5.5 06/08/2021   VLDL 45 (H) 06/08/2021   LDLCALC 64 06/08/2021   LDLCALC 80 08/26/2019    Physical Findings: AIMS:  , ,  ,  ,    CIWA:    COWS:     Musculoskeletal: Strength & Muscle Tone: within normal limits Gait & Station: normal Patient leans: N/A  Psychiatric Specialty Exam: Physical Exam Vitals and nursing note reviewed.    Review of Systems  Blood pressure (!) 128/97, pulse 80, temperature 97.7 F (36.5 C), temperature source Oral, resp. rate 18, height 6' (1.829 m), weight 131.1 kg, SpO2 94 %.Body mass index is 39.2 kg/m.  General Appearance: Casual  Eye Contact:  Good  Speech:  Clear and Coherent and Normal Rate  Volume:  Normal  Mood:  Depressed  Affect:  Congruent and Constricted  Thought Process:  Coherent, Goal Directed and Linear  Orientation:  Full (Time, Place, and Person)  Thought Content:  Logical  Suicidal Thoughts:  Yes, passive SI  Homicidal Thoughts:  No  Memory:  Immediate;   Fair Recent;   Fair Remote;   Fair  Judgement:  Fair  Insight:  Fair  Psychomotor Activity:  Normal  Concentration:  Concentration: Fair and Attention Span: Fair  Recall:  [employer] of Knowledge:  Fair  Language:  Fair  Akathisia:  No  Handed:  Right  AIMS (if indicated):     Assets:  Communication Skills Desire for Improvement Resilience Social Support  ADL's:  Intact  Cognition:  WNL  Sleep:  Number of Hours: 7     Treatment Plan Summary: Daily contact with patient to assess and evaluate symptoms and progress in treatment and Medication management    Patient is a 60 year old male with the  above-stated past psychiatric and medical history who was admitted to [facility] unit secondary to worsened depression and suicidal ideations.  Today continues to reports feeling depressed and anxious due to unstable housing situation; denies active suicidal thoughts, plans. Decrease Lisinopril 20 mg daily.    Plan:  -continue inpatient psych admission; 15-minute checks; daily contact with patient to assess and evaluate symptoms and progress in treatment; psychoeducation.  -continue scheduled medications: . aspirin EC  81 mg Oral Daily  . atorvastatin  40 mg Oral Daily  . busPIRone  20 mg Oral TID  . COVID-19 mRNA Vac-TriS (Pfizer)  0.3 mL Intramuscular Once  . escitalopram  20 mg Oral Daily  . hydrochlorothiazide  25 mg Oral Daily  . [START ON 06/15/2021] lisinopril  20 mg Oral Daily  . metFORMIN  1,000 mg Oral BID WC  . mometasone-formoterol  2 puff Inhalation BID  . nicotine  21 mg Transdermal Daily  . pantoprazole  40 mg Oral Daily  . QUEtiapine  400 mg Oral QHS  . zolpidem  10 mg Oral QHS     -continue PRN medications.  albuterol, alum & mag hydroxide-simeth, clonazePAM, hydrALAZINE, hydrOXYzine, ibuprofen, magnesium hydroxide  -Pertinent Labs: hemoglobin a1c 7.4, diet counseling provided    -Consults: No new consults placed since yesterday    -Disposition: Estimated duration of hospitalization: possibly next week. Social work consult. All necessary aftercare will be arranged prior to discharge Likely d/c home with outpatient psych follow-up.  -  I certify that the patient does need, on a daily basis, active treatment furnished directly by or requiring the supervision of inpatient psychiatric facility personnel.   06/14/21: Psychiatric exam above reviewed and remains accurate. Assessment and plan above reviewed and updated.      [name], MD 06/14/2021, 2:05 PM Physical Exam Vitals and nursing note reviewed.   ROS

## 2021-06-14 NOTE — [facility] Group Notes (Signed)
LCSW Group Therapy Note     06/14/2021 3:00 PM  Type of Therapy and Topic:  Group Therapy:  Feelings around Relapse and Recovery  Participation Level:  Did Not Attend  Description of Group:    Patients in this group will discuss emotions they experience before and after a relapse. They will process how experiencing these feelings, or avoidance of experiencing them, relates to having a relapse. Facilitator will guide patients to explore emotions they have related to recovery. Patients will be encouraged to process which emotions are more powerful. They will be guided to discuss the emotional reaction significant others in their lives may have to their relapse or recovery. Patients will be assisted in exploring ways to respond to the emotions of others without this contributing to a relapse.     Therapeutic Goals:  1.    Patient will identify two or more emotions that lead to a relapse for them 2.    Patient will identify two emotions that result when they relapse 3.    Patient will identify two emotions related to recovery 4.    Patient will demonstrate ability to communicate their needs through discussion and/or role plays    Summary of Patient Progress: Patient did not attend group despite encouraged participation.      Therapeutic Modalities:  Cognitive Behavioral Therapy Solution-Focused Therapy Assertiveness Training Relapse Prevention Therapy    Paulla Dolly, LCSW, LCAS-A  06/14/2021 3:00 PM

## 2021-06-14 NOTE — Plan of Care (Signed)
Problem: Education: Goal: Knowledge of [place] [facility] Education information/materials will improve 06/14/2021 1903 by Kieth Brightly, RN Outcome: Progressing 06/14/2021 1039 by Kieth Brightly, RN Outcome: Progressing Goal: Emotional status will improve 06/14/2021 1903 by Kieth Brightly, RN Outcome: Progressing 06/14/2021 1039 by Kieth Brightly, RN Outcome: Progressing Goal: Mental status will improve 06/14/2021 1903 by Kieth Brightly, RN Outcome: Progressing 06/14/2021 1039 by Kieth Brightly, RN Outcome: Progressing Goal: Verbalization of understanding the information provided will improve 06/14/2021 1903 by Kieth Brightly, RN Outcome: Progressing 06/14/2021 1039 by Kieth Brightly, RN Outcome: Progressing   Problem: Activity: Goal: Interest or engagement in activities will improve 06/14/2021 1903 by Kieth Brightly, RN Outcome: Progressing 06/14/2021 1039 by Kieth Brightly, RN Outcome: Progressing Goal: Sleeping patterns will improve 06/14/2021 1903 by Kieth Brightly, RN Outcome: Progressing 06/14/2021 1039 by Kieth Brightly, RN Outcome: Progressing   Problem: Coping: Goal: Ability to verbalize frustrations and anger appropriately will improve 06/14/2021 1903 by Kieth Brightly, RN Outcome: Progressing 06/14/2021 1039 by Kieth Brightly, RN Outcome: Progressing Goal: Ability to demonstrate self-control will improve 06/14/2021 1903 by Kieth Brightly, RN Outcome: Progressing 06/14/2021 1039 by Kieth Brightly, RN Outcome: Progressing   Problem: Health Behavior/Discharge Planning: Goal: Identification of resources available to assist in meeting health care needs will improve 06/14/2021 1903 by Kieth Brightly, RN Outcome: Progressing 06/14/2021 1039 by Kieth Brightly, RN Outcome: Progressing Goal: Compliance with treatment plan for underlying cause of condition will improve 06/14/2021 1903 by Kieth Brightly, RN Outcome: Progressing 06/14/2021 1039 by Kieth Brightly, RN Outcome: Progressing   Problem: Physical Regulation: Goal: Ability to maintain clinical measurements within normal limits will improve 06/14/2021 1903 by Kieth Brightly, RN Outcome: Progressing 06/14/2021 1039 by Kieth Brightly, RN Outcome: Progressing   Problem: Safety: Goal: Periods of time without injury will increase 06/14/2021 1903 by Kieth Brightly, RN Outcome: Progressing 06/14/2021 1039 by Kieth Brightly, RN Outcome: Progressing   Problem: Education: Goal: Utilization of techniques to improve thought processes will improve 06/14/2021 1903 by Kieth Brightly, RN Outcome: Progressing 06/14/2021 1039 by Kieth Brightly, RN Outcome: Progressing Goal: Knowledge of the prescribed therapeutic regimen will improve 06/14/2021 1903 by Kieth Brightly, RN Outcome: Progressing 06/14/2021 1039 by Kieth Brightly, RN Outcome: Progressing   Problem: Activity: Goal: Interest or engagement in leisure activities will improve 06/14/2021 1903 by Kieth Brightly, RN Outcome: Progressing 06/14/2021 1039 by Kieth Brightly, RN Outcome: Progressing Goal: Imbalance in normal sleep/wake cycle will improve 06/14/2021 1903 by Kieth Brightly, RN Outcome: Progressing 06/14/2021 1039 by Kieth Brightly, RN Outcome: Progressing   Problem: Coping: Goal: Coping ability will improve 06/14/2021 1903 by Kieth Brightly, RN Outcome: Progressing 06/14/2021 1039 by Kieth Brightly, RN Outcome: Progressing Goal: Will verbalize feelings 06/14/2021 1903 by Kieth Brightly, RN Outcome: Progressing 06/14/2021 1039 by Kieth Brightly, RN Outcome: Progressing   Problem: Health Behavior/Discharge Planning: Goal: Ability to make decisions will improve 06/14/2021 1903 by Kieth Brightly, RN Outcome: Progressing 06/14/2021 1039 by Kieth Brightly, RN Outcome: Progressing Goal: Compliance with therapeutic regimen will improve 06/14/2021 1903 by Kieth Brightly, RN Outcome: Progressing 06/14/2021 1039 by  Kieth Brightly, RN Outcome: Progressing   Problem: Role Relationship: Goal: Will demonstrate positive changes in social behaviors and relationships 06/14/2021 1903 by Kieth Brightly, RN Outcome: Progressing 06/14/2021 1039 by Kieth Brightly, RN Outcome:  Progressing   Problem: Safety: Goal: Ability to disclose and discuss suicidal ideas will improve 06/14/2021 1903 by Kieth Brightly, RN Outcome: Progressing 06/14/2021 1039 by Kieth Brightly, RN Outcome: Progressing Goal: Ability to identify and utilize support systems that promote safety will improve 06/14/2021 1903 by Kieth Brightly, RN Outcome: Progressing 06/14/2021 1039 by Kieth Brightly, RN Outcome: Progressing   Problem: Self-Concept: Goal: Will verbalize positive feelings about self 06/14/2021 1903 by Kieth Brightly, RN Outcome: Progressing 06/14/2021 1039 by Kieth Brightly, RN Outcome: Progressing Goal: Level of anxiety will decrease 06/14/2021 1903 by Kieth Brightly, RN Outcome: Progressing 06/14/2021 1039 by Kieth Brightly, RN Outcome: Progressing   Problem: Education: Goal: Ability to state activities that reduce stress will improve 06/14/2021 1903 by Kieth Brightly, RN Outcome: Progressing 06/14/2021 1039 by Kieth Brightly, RN Outcome: Progressing   Problem: Coping: Goal: Ability to identify and develop effective coping behavior will improve 06/14/2021 1903 by Kieth Brightly, RN Outcome: Progressing 06/14/2021 1039 by Kieth Brightly, RN Outcome: Progressing   Problem: Self-Concept: Goal: Ability to identify factors that promote anxiety will improve 06/14/2021 1903 by Kieth Brightly, RN Outcome: Progressing 06/14/2021 1039 by Kieth Brightly, RN Outcome: Progressing Goal: Level of anxiety will decrease 06/14/2021 1903 by Kieth Brightly, RN Outcome: Progressing 06/14/2021 1039 by Kieth Brightly, RN Outcome: Progressing Goal: Ability to modify response to factors that promote anxiety will  improve 06/14/2021 1903 by Kieth Brightly, RN Outcome: Progressing 06/14/2021 1039 by Kieth Brightly, RN Outcome: Progressing

## 2021-06-14 NOTE — Progress Notes (Signed)
Pt rates depression, anxiety, SI, HI and AVH all  5/10. Pt was educated on care plan and verbalizes understanding. Collier Bullock RN

## 2021-06-15 DIAGNOSIS — F332 Major depressive disorder, recurrent severe without psychotic features: Principal | ICD-10-CM

## 2021-06-15 NOTE — Tx Team (Signed)
Interdisciplinary Treatment and Diagnostic Plan Update  06/15/2021 Time of Session: 9:00 AM Ian [name] MRN: [id number]  Principal Diagnosis: Severe recurrent major depression without psychotic features [facility])  Secondary Diagnoses: Principal Problem:   Severe recurrent major depression without psychotic features ([place]) Active Problems:   HTN (hypertension)   Dyslipidemia   Generalized anxiety disorder   Type 2 diabetes mellitus with hyperglycemia ([place])   Current Medications:  Current Facility-Administered Medications  Medication Dose Route Frequency Provider Last Rate Last Admin   albuterol (VENTOLIN HFA) 108 (90 Base) MCG/ACT inhaler 2 puff  2 puff Inhalation Q6H PRN [name], [name], MD   2 puff at 06/13/21 2145   alum & mag hydroxide-simeth (MAALOX/MYLANTA) 200-200-20 MG/5ML suspension 30 mL  30 mL Oral Q4H PRN [name], [name], MD       aspirin EC tablet 81 mg  81 mg Oral Daily Selina Cooley M, MD   81 mg at 06/15/21 0820   atorvastatin (LIPITOR) tablet 40 mg  40 mg Oral Daily [name], [name], MD   40 mg at 06/15/21 0820   busPIRone (BUSPAR) tablet 20 mg  20 mg Oral TID [name], [name], MD   20 mg at 06/15/21 0820   clonazePAM (KLONOPIN) tablet 0.5 mg  0.5 mg Oral BID PRN [name], [name], MD   0.5 mg at 06/13/21 1627   escitalopram (LEXAPRO) tablet 20 mg  20 mg Oral Daily [name], [name], MD   20 mg at 06/15/21 0820   hydrALAZINE (APRESOLINE) tablet 10 mg  10 mg Oral Q6H PRN [name], MD   10 mg at 06/07/21 1404   hydrochlorothiazide (HYDRODIURIL) tablet 25 mg  25 mg Oral Daily [name], [name], MD   25 mg at 06/15/21 0820   hydrOXYzine (ATARAX/VISTARIL) tablet 25 mg  25 mg Oral TID PRN [name], MD   25 mg at 06/12/21 1603   ibuprofen (ADVIL) tablet 600 mg  600 mg Oral Q6H PRN Larita Fife, MD   600 mg at 06/14/21 1224   lisinopril (ZESTRIL) tablet 20 mg  20 mg Oral Daily [name], MD   20 mg at 06/15/21 1245   magnesium hydroxide (MILK OF MAGNESIA)  suspension 30 mL  30 mL Oral Daily PRN [name], [name], MD       metFORMIN (GLUCOPHAGE) tablet 1,000 mg  1,000 mg Oral BID WC [name], [name], MD   1,000 mg at 06/15/21 0820   mometasone-formoterol (DULERA) 200-5 MCG/ACT inhaler 2 puff  2 puff Inhalation BID [name], [name], MD   2 puff at 06/15/21 0819   nicotine (NICODERM CQ - dosed in mg/24 hours) patch 21 mg  21 mg Transdermal Daily [name], [name], MD   21 mg at 06/15/21 0821   pantoprazole (PROTONIX) EC tablet 40 mg  40 mg Oral Daily [name], [name], MD   40 mg at 06/15/21 0820   QUEtiapine (SEROQUEL XR) 24 hr tablet 400 mg  400 mg Oral QHS [name], [name], MD   400 mg at 06/14/21 2139   zolpidem (AMBIEN) tablet 10 mg  10 mg Oral QHS [name], MD   10 mg at 06/14/21 2139   PTA Medications: Medications Prior to Admission  Medication Sig Dispense Refill Last Dose   ADVAIR DISKUS 500-50 MCG/DOSE AEPB Inhale 1 puff into the lungs 2 (two) times daily.      albuterol (VENTOLIN HFA) 108 (90 Base) MCG/ACT inhaler Inhale 2 puffs into the lungs  every 6 (six) hours as needed for wheezing or shortness of breath. 18 g 1    amLODipine (NORVASC) 5 MG tablet Take 1 tablet (5 mg total) by mouth daily. 30 tablet 1    aspirin 81 MG chewable tablet Chew 1 tablet (81 mg total) by mouth daily. (Patient not taking: Reported on 06/06/2021) 30 tablet 1    atorvastatin (LIPITOR) 40 MG tablet Take 1 tablet (40 mg total) by mouth daily. 30 tablet 1    busPIRone (BUSPAR) 10 MG tablet Take 2 tablets (20 mg total) by mouth 3 (three) times daily. 180 tablet 1    clonazePAM (KLONOPIN) 0.5 MG tablet Take 1 tablet (0.5 mg total) by mouth 2 (two) times daily as needed (Anxiety). 60 tablet 1    escitalopram (LEXAPRO) 20 MG tablet Take 1 tablet (20 mg total) by mouth daily. 30 tablet 1    hydrochlorothiazide (HYDRODIURIL) 25 MG tablet Take 1 tablet (25 mg total) by mouth daily. 30 tablet 1    hydrOXYzine (ATARAX/VISTARIL) 25 MG tablet Take 1 tablet (25 mg total) by mouth  3 (three) times daily as needed for anxiety. (Patient not taking: Reported on 06/06/2021) 90 tablet 1    lisinopril (ZESTRIL) 40 MG tablet Take 1 tablet (40 mg total) by mouth daily. 30 tablet 1    metFORMIN (FORTAMET) 1000 MG (OSM) 24 hr tablet Take 1 tablet (1,000 mg total) by mouth 2 (two) times daily with a meal. (Patient not taking: Reported on 06/06/2021) 60 tablet 1    metFORMIN (GLUCOPHAGE-XR) 500 MG 24 hr tablet Take 1,000 mg by mouth 2 (two) times daily.      mometasone-formoterol (DULERA) 200-5 MCG/ACT AERO Inhale 2 puffs into the lungs 2 (two) times daily. (Patient not taking: Reported on 06/06/2021) 1 each 1    pantoprazole (PROTONIX) 40 MG tablet Take 1 tablet (40 mg total) by mouth daily. 30 tablet 1    QUEtiapine (SEROQUEL XR) 400 MG 24 hr tablet Take 1 tablet (400 mg total) by mouth at bedtime. 30 tablet 1    zolpidem (AMBIEN) 10 MG tablet Take 1 tablet (10 mg total) by mouth at bedtime as needed for sleep. 30 tablet 1     Patient Stressors: Financial difficulties Other: living situation  Patient Strengths: Agricultural engineer for treatment/growth  Treatment Modalities: Medication Management, Group therapy, Case management,  1 to 1 session with clinician, Psychoeducation, Recreational therapy.   Physician Treatment Plan for Primary Diagnosis: Severe recurrent major depression without psychotic features ([place]) Long Term Goal(s): Improvement in symptoms so as ready for discharge   Short Term Goals: Ability to identify changes in lifestyle to reduce recurrence of condition will improve Ability to verbalize feelings will improve Ability to disclose and discuss suicidal ideas Ability to demonstrate self-control will improve Ability to identify and develop effective coping behaviors will improve Ability to maintain clinical measurements within normal limits will improve Compliance with prescribed medications will improve Ability to identify triggers associated with  substance abuse/mental health issues will improve  Medication Management: Evaluate patient's response, side effects, and tolerance of medication regimen.  Therapeutic Interventions: 1 to 1 sessions, Unit Group sessions and Medication administration.  Evaluation of Outcomes: Progressing  Physician Treatment Plan for Secondary Diagnosis: Principal Problem:   Severe recurrent major depression without psychotic features ([place]) Active Problems:   HTN (hypertension)   Dyslipidemia   Generalized anxiety disorder   Type 2 diabetes mellitus with hyperglycemia ([place])  Long Term Goal(s): Improvement in symptoms so as ready  for discharge   Short Term Goals: Ability to identify changes in lifestyle to reduce recurrence of condition will improve Ability to verbalize feelings will improve Ability to disclose and discuss suicidal ideas Ability to demonstrate self-control will improve Ability to identify and develop effective coping behaviors will improve Ability to maintain clinical measurements within normal limits will improve Compliance with prescribed medications will improve Ability to identify triggers associated with substance abuse/mental health issues will improve     Medication Management: Evaluate patient's response, side effects, and tolerance of medication regimen.  Therapeutic Interventions: 1 to 1 sessions, Unit Group sessions and Medication administration.  Evaluation of Outcomes: Progressing   RN Treatment Plan for Primary Diagnosis: Severe recurrent major depression without psychotic features ([place]) Long Term Goal(s): Knowledge of disease and therapeutic regimen to maintain health will improve  Short Term Goals: Ability to remain free from injury will improve, Ability to verbalize frustration and anger appropriately will improve, Ability to verbalize feelings will improve, Ability to disclose and discuss suicidal ideas, Ability to identify and develop effective coping behaviors  will improve, and Compliance with prescribed medications will improve  Medication Management: RN will administer medications as ordered by provider, will assess and evaluate patient's response and provide education to patient for prescribed medication. RN will report any adverse and/or side effects to prescribing provider.  Therapeutic Interventions: 1 on 1 counseling sessions, Psychoeducation, Medication administration, Evaluate responses to treatment, Monitor vital signs and CBGs as ordered, Perform/monitor CIWA, COWS, AIMS and Fall Risk screenings as ordered, Perform wound care treatments as ordered.  Evaluation of Outcomes: Progressing   LCSW Treatment Plan for Primary Diagnosis: Severe recurrent major depression without psychotic features ([place]) Long Term Goal(s): Safe transition to appropriate next level of care at discharge, Engage patient in therapeutic group addressing interpersonal concerns.  Short Term Goals: Engage patient in aftercare planning with referrals and resources, Increase social support, Increase emotional regulation, Identify triggers associated with mental health/substance abuse issues, and Increase skills for wellness and recovery  Therapeutic Interventions: Assess for all discharge needs, 1 to 1 time with Social worker, Explore available resources and support systems, Assess for adequacy in community support network, Educate family and significant other(s) on suicide prevention, Complete Psychosocial Assessment, Interpersonal group therapy.  Evaluation of Outcomes: Progressing   Progress in Treatment: Attending groups: No. Participating in groups: No. Taking medication as prescribed: Yes. Toleration medication: Yes. Family/Significant other contact made: Yes, individual(s) contacted:  Noland Fordyce, peer support specialist Patient understands diagnosis: Yes. Discussing patient identified problems/goals with staff: Yes. Medical problems stabilized or resolved:  Yes. Denies suicidal/homicidal ideation: Yes. Issues/concerns per patient self-inventory: No. Other: None.  New problem(s) identified: No, Describe:  none. Update 06/15/2021: No changes at this time.   New Short Term/Long Term Goal(s): medication management for mood stabilization; elimination of SI thoughts; development of comprehensive mental wellness plan. Update 06/15/2021: No changes at this time.   Patient Goals: "A place stay on [address]  Update 06/15/2021: No changes at this time.   Discharge Plan or Barriers: CSW will assist with development of an appropriate aftercare/discharge plan.  Update 06/15/2021: CSW to place request for charitable foundations to assist with home utility repair.   Reason for Continuation of Hospitalization: Anxiety Depression Medication stabilization Suicidal ideation  Estimated Length of Stay: 1-7 days  Recreational Therapy: Patient Stressors: N/A Patient Goal: Patient will engage in groups without prompting or encouragement from LRT x3 group sessions within 5 recreation therapy group sessions  Attendees: Patient: Ian [name]  06/15/2021 9:06 AM  Physician: Alethia Berthold, MD 06/15/2021 9:06 AM  Nursing: Ophelia Charter, LPN 4/82/7078 6:75 AM  RN Care Manager: 06/15/2021 9:06 AM  Social Worker: Paulla Dolly, MSW, [place], [place]  06/15/2021 9:06 AM  Recreational Therapist:  06/15/2021 9:06 AM  Other:  06/15/2021 9:06 AM  Other:  06/15/2021 9:06 AM  Other: 06/15/2021 9:06 AM    Scribe for Treatment Team: Durenda Hurt, [place] 06/15/2021 9:06 AM

## 2021-06-15 NOTE — BHH Group Notes (Signed)
LCSW Group Therapy Note  06/15/2021 2:48 PM  Type of Therapy and Topic:  Group Therapy: Avoiding Self-Sabotaging and Enabling Behaviors  Participation Level:  Minimal   Description of Group:   In this group, patients will learn how to identify obstacles, self-sabotaging and enabling behaviors, as well as: what are they, why do we do them and what needs these behaviors meet. Discuss unhealthy relationships and how to have positive healthy boundaries with those that sabotage and enable. Explore aspects of self-sabotage and enabling in yourself and how to limit these self-destructive behaviors in everyday life.   Therapeutic Goals: Patient will identify one obstacle that relates to self-sabotage and enabling behaviors Patient will identify one personal self-sabotaging or enabling behavior they did prior to admission Patient will state a plan to change the above identified behavior Patient will demonstrate ability to communicate their needs through discussion and/or role play.   Summary of Patient Progress: Patient presented to group initially, though found participation difficult. Patient found group conversation difficult to navigate and left 15 minutes into group conversation. Prior to leaving, patient shared that he tends to self isolate.    Therapeutic Modalities:   Cognitive Behavioral Therapy Person-Centered Therapy Motivational Interviewing   Paulla Dolly, MSW, [place], [place] 06/15/2021 2:48 PM

## 2021-06-15 NOTE — Progress Notes (Signed)
[facility] MD Progress Note  06/15/2021 1:25 PM Ian [name]  MRN:  [id number] Subjective: Follow-up patient with chronic depression and anxiety.  Patient has no new complaints.  Reports that he is feeling much better.  Looking forward to an improved discharge plan later this week.  Physically doing well.  Tolerating medicine well.  He is keeping himself clean coming out of his room and interacting appropriately Principal Problem: Severe recurrent major depression without psychotic features ([place]) Diagnosis: Principal Problem:   Severe recurrent major depression without psychotic features ([place]) Active Problems:   HTN (hypertension)   Dyslipidemia   Generalized anxiety disorder   Type 2 diabetes mellitus with hyperglycemia ([place])  Total Time spent with patient: 30 minutes  Past Psychiatric History: Past history of recurrent depression and anxiety related to extremely stressful life circumstance.  History of alcohol abuse currently staying sober  Past Medical History:  Past Medical History:  Diagnosis Date   Anxiety    Barrett esophagus    Cancer ([place])    COPD (chronic obstructive pulmonary disease) ([place])    Coronary artery disease    Depression    GERD (gastroesophageal reflux disease)    Hypertension    Pre-diabetes    Stroke ([place]) 2014   "mini-stroke" per patient    Past Surgical History:  Procedure Laterality Date   ANGIOPLASTY     APPENDECTOMY     CARDIAC CATHETERIZATION     CHOLECYSTECTOMY     WRIST SURGERY Right    age 55   Family History:  Family History  Problem Relation Age of Onset   Stroke Father    Leukemia Father    Breast cancer Sister    Family Psychiatric  History: See previous Social History:  Social History   Substance and Sexual Activity  Alcohol Use Not Currently     Social History   Substance and Sexual Activity  Drug Use Yes   Types: Cocaine, Marijuana    Social History   Socioeconomic History   Marital status: Divorced    Spouse name:  Not on file   Number of children: Not on file   Years of education: Not on file   Highest education level: Not on file  Occupational History   Not on file  Tobacco Use   Smoking status: Every Day    Packs/day: 1.50    Pack years: 0.00    Types: Cigarettes   Smokeless tobacco: Never  Vaping Use   Vaping Use: Never used  Substance and Sexual Activity   Alcohol use: Not Currently   Drug use: Yes    Types: Cocaine, Marijuana   Sexual activity: Not Currently  Other Topics Concern   Not on file  Social History Narrative   Not on file   Social Determinants of Health   Financial Resource Strain: Not on file  Food Insecurity: Not on file  Transportation Needs: Not on file  Physical Activity: Not on file  Stress: Not on file  Social Connections: Not on file   Additional Social History:                         Sleep: Fair  Appetite:  Fair  Current Medications: Current Facility-Administered Medications  Medication Dose Route Frequency Provider Last Rate Last Admin   albuterol (VENTOLIN HFA) 108 (90 Base) MCG/ACT inhaler 2 puff  2 puff Inhalation Q6H PRN Louise Victory, [name], MD   2 puff at 06/15/21 1216   alum &  mag hydroxide-simeth (MAALOX/MYLANTA) 200-200-20 MG/5ML suspension 30 mL  30 mL Oral Q4H PRN Alexias Margerum, [name], MD       aspirin EC tablet 81 mg  81 mg Oral Daily Selina Cooley M, MD   81 mg at 06/15/21 0820   atorvastatin (LIPITOR) tablet 40 mg  40 mg Oral Daily Durrel Mcnee, [name], MD   40 mg at 06/15/21 0820   busPIRone (BUSPAR) tablet 20 mg  20 mg Oral TID Zylpha Poynor T, MD   20 mg at 06/15/21 1217   clonazePAM (KLONOPIN) tablet 0.5 mg  0.5 mg Oral BID PRN Dashayla Theissen T, MD   0.5 mg at 06/13/21 1627   escitalopram (LEXAPRO) tablet 20 mg  20 mg Oral Daily Lunden Mcleish T, MD   20 mg at 06/15/21 0820   hydrALAZINE (APRESOLINE) tablet 10 mg  10 mg Oral Q6H PRN [name], MD   10 mg at 06/07/21 1404   hydrochlorothiazide (HYDRODIURIL) tablet 25 mg  25 mg Oral  Daily Lynzee Lindquist, [name], MD   25 mg at 06/15/21 0820   hydrOXYzine (ATARAX/VISTARIL) tablet 25 mg  25 mg Oral TID PRN [name], MD   25 mg at 06/12/21 1603   ibuprofen (ADVIL) tablet 600 mg  600 mg Oral Q6H PRN Larita Fife, MD   600 mg at 06/15/21 1218   lisinopril (ZESTRIL) tablet 20 mg  20 mg Oral Daily [name], MD   20 mg at 06/15/21 7824   magnesium hydroxide (MILK OF MAGNESIA) suspension 30 mL  30 mL Oral Daily PRN Lorn Butcher, [name], MD       metFORMIN (GLUCOPHAGE) tablet 1,000 mg  1,000 mg Oral BID WC Zhanae Proffit T, MD   1,000 mg at 06/15/21 0820   mometasone-formoterol (DULERA) 200-5 MCG/ACT inhaler 2 puff  2 puff Inhalation BID Ceria Suminski, [name], MD   2 puff at 06/15/21 0819   nicotine (NICODERM CQ - dosed in mg/24 hours) patch 21 mg  21 mg Transdermal Daily Jillyan Plitt T, MD   21 mg at 06/15/21 0821   pantoprazole (PROTONIX) EC tablet 40 mg  40 mg Oral Daily Era Parr T, MD   40 mg at 06/15/21 0820   QUEtiapine (SEROQUEL XR) 24 hr tablet 400 mg  400 mg Oral QHS Javar Eshbach T, MD   400 mg at 06/14/21 2139   zolpidem (AMBIEN) tablet 10 mg  10 mg Oral QHS [name], MD   10 mg at 06/14/21 2139    Lab Results: No results found for this or any previous visit (from the past 48 hour(s)).  Blood Alcohol level:  Lab Results  Component Value Date   ETH <10 06/06/2021   ETH <10 23/53/6144    Metabolic Disorder Labs: Lab Results  Component Value Date   HGBA1C 7.4 (H) 06/08/2021   MPG 166 06/08/2021   MPG 165.68 01/04/2021   No results found for: PROLACTIN Lab Results  Component Value Date   CHOL 133 06/08/2021   TRIG 225 (H) 06/08/2021   HDL 24 (L) 06/08/2021   CHOLHDL 5.5 06/08/2021   VLDL 45 (H) 06/08/2021   LDLCALC 64 06/08/2021   LDLCALC 80 08/26/2019    Physical Findings: AIMS:  , ,  ,  ,    CIWA:    COWS:     Musculoskeletal: Strength & Muscle Tone: within normal limits Gait & Station: normal Patient leans: N/A  Psychiatric Specialty  Exam:  Presentation  General Appearance:  Disheveled  Eye Contact:Good  Speech:Normal Rate  Speech Volume:Normal  Handedness:Right   Mood and Affect  Mood:Anxious; Depressed  Affect:Congruent   Thought Process  Thought Processes:Coherent; Goal Directed  Descriptions of Associations:Intact  Orientation:Full (Time, Place and Person)  Thought Content:Logical  History of Schizophrenia/Schizoaffective disorder:No data recorded Duration of Psychotic Symptoms:No data recorded Hallucinations:No data recorded Ideas of Reference:None  Suicidal Thoughts:No data recorded Homicidal Thoughts:No data recorded  Sensorium  Memory:Immediate Good; Recent Good; Remote Good  Judgment:Intact  Insight:Fair   Executive Functions  Concentration:Good  Attention Span:Good  [place] of Knowledge:Good  Language:Good   Psychomotor Activity  Psychomotor Activity: No data recorded  Assets  Assets:Communication Skills; Desire for Improvement; Financial Resources/Insurance; Resilience; Social Support   Sleep  Sleep: No data recorded   Physical Exam: Physical Exam Vitals and nursing note reviewed.  Constitutional:      Appearance: Normal appearance.  HENT:     Head: Normocephalic and atraumatic.     Mouth/Throat:     Pharynx: Oropharynx is clear.  Eyes:     Pupils: Pupils are equal, round, and reactive to light.  Cardiovascular:     Rate and Rhythm: Normal rate and regular rhythm.  Pulmonary:     Effort: Pulmonary effort is normal.     Breath sounds: Normal breath sounds.  Abdominal:     General: Abdomen is flat.     Palpations: Abdomen is soft.  Musculoskeletal:        General: Normal range of motion.  Skin:    General: Skin is warm and dry.  Neurological:     General: No focal deficit present.     Mental Status: He is alert. Mental status is at baseline.  Psychiatric:        Attention and Perception: Attention normal.        Mood and Affect:  Mood normal.        Speech: Speech normal.        Behavior: Behavior is cooperative.        Thought Content: Thought content normal.        Cognition and Memory: Cognition normal.   Review of Systems  Constitutional: Negative.   HENT: Negative.    Eyes: Negative.   Respiratory: Negative.    Cardiovascular: Negative.   Gastrointestinal: Negative.   Musculoskeletal: Negative.   Skin: Negative.   Neurological: Negative.   Psychiatric/Behavioral: Negative.    Blood pressure 132/81, pulse 86, temperature 97.7 F (36.5 C), temperature source Oral, resp. rate 18, height 6' (1.829 m), weight 131.1 kg, SpO2 94 %. Body mass index is 39.2 kg/m.   Treatment Plan Summary: Plan supportive counseling and therapy.  Encouragement.  Review of medicine.  No change in any medication or current plan for him.  Alethia Berthold, MD 06/15/2021, 1:25 PM

## 2021-06-15 NOTE — Progress Notes (Signed)
The patient was compliant with his medications tonight. He refused the [facility] stating that he only takes it once a day. He also states that he has not had to use his inhaler as much, because he can't smoke like he does at home. We discussed ways that he could continue this once he is discharged. I let him know that he could continue with the nicotine patch and gum once he is discharged. He continues to be depressed, but denise SI/HI/AVH. Will continue to monitor and provide support.   Ivonne Andrew, RN

## 2021-06-16 NOTE — Progress Notes (Signed)
Patient alert and oriented x 4, affect is flat but brightens upon approach, he denies SL/HI/AVH. Patient was receptive to staff, complaint with PO drugs expert Dulera inhaler he stated " I take only once " patient rated depression  5/10 ( Low 0 - high 10 ) he was offered emotional support and encouraged to attend evening wrap up grout, 15 minutes safety checks maintained,

## 2021-06-16 NOTE — [facility] Group Notes (Signed)
LCSW Group Therapy Note  06/16/2021 2:42 PM  Type of Therapy and Topic:  Group Therapy:  Creating Healthy Boundaries  Participation Level:  Did Not Attend   Description of Group:   Patients in this group will discuss how creating boundaries supports mental health wellness and sobriety. Participants will consider examples of both healthy and unhealthy boundaries and potential consequences of both. Participants are asked to share how they have implemented healthy boundaries in the past and encouraged to create new boundaries to implement in their lives post hospitalization.   Therapeutic Goals: Patient will identify two or more emotions that lead to a relapse for them Patient will identify two emotions that result when they relapse Patient will identify two emotions related to recovery Patient will demonstrate ability to communicate their needs through discussion and/or role plays   Summary of Patient Progress: Patient did not attend group despite encouraged participation.    Therapeutic Modalities:   Cognitive Behavioral Therapy Solution-Focused Therapy Assertiveness Training Relapse Prevention Therapy   Paulla Dolly, MSW, [place], [place] 06/16/2021 2:42 PM

## 2021-06-16 NOTE — Progress Notes (Signed)
[facility] MD Progress Note  06/16/2021 11:26 AM Ian [name]  MRN:  [id number] Subjective: Follow-up patient with history of depression and anxiety.  Patient reports he is continuing to feel better.  More optimistic about life outside the hospital.  Talks a lot about having "more resources" when he goes home this upcoming Friday.  No physical complaints Principal Problem: Severe recurrent major depression without psychotic features ([place]) Diagnosis: Principal Problem:   Severe recurrent major depression without psychotic features ([place]) Active Problems:   HTN (hypertension)   Dyslipidemia   Generalized anxiety disorder   Type 2 diabetes mellitus with hyperglycemia ([place])  Total Time spent with patient: 30 minutes  Past Psychiatric History: Past history of recurrent depression and anxiety.  Past history of alcohol abuse currently under control  Past Medical History:  Past Medical History:  Diagnosis Date   Anxiety    Barrett esophagus    Cancer ([place])    COPD (chronic obstructive pulmonary disease) ([place])    Coronary artery disease    Depression    GERD (gastroesophageal reflux disease)    Hypertension    Pre-diabetes    Stroke ([place]) 2014   "mini-stroke" per patient    Past Surgical History:  Procedure Laterality Date   ANGIOPLASTY     APPENDECTOMY     CARDIAC CATHETERIZATION     CHOLECYSTECTOMY     WRIST SURGERY Right    age 36   Family History:  Family History  Problem Relation Age of Onset   Stroke Father    Leukemia Father    Breast cancer Sister    Family Psychiatric  History: See previous Social History:  Social History   Substance and Sexual Activity  Alcohol Use Not Currently     Social History   Substance and Sexual Activity  Drug Use Yes   Types: Cocaine, Marijuana    Social History   Socioeconomic History   Marital status: Divorced    Spouse name: Not on file   Number of children: Not on file   Years of education: Not on file   Highest education  level: Not on file  Occupational History   Not on file  Tobacco Use   Smoking status: Every Day    Packs/day: 1.50    Pack years: 0.00    Types: Cigarettes   Smokeless tobacco: Never  Vaping Use   Vaping Use: Never used  Substance and Sexual Activity   Alcohol use: Not Currently   Drug use: Yes    Types: Cocaine, Marijuana   Sexual activity: Not Currently  Other Topics Concern   Not on file  Social History Narrative   Not on file   Social Determinants of Health   Financial Resource Strain: Not on file  Food Insecurity: Not on file  Transportation Needs: Not on file  Physical Activity: Not on file  Stress: Not on file  Social Connections: Not on file   Additional Social History:                         Sleep: Fair  Appetite:  Fair  Current Medications: Current Facility-Administered Medications  Medication Dose Route Frequency Provider Last Rate Last Admin   albuterol (VENTOLIN HFA) 108 (90 Base) MCG/ACT inhaler 2 puff  2 puff Inhalation Q6H PRN Myria Steenbergen T, MD   2 puff at 06/15/21 1216   alum & mag hydroxide-simeth (MAALOX/MYLANTA) 200-200-20 MG/5ML suspension 30 mL  30 mL Oral Q4H PRN Kwadwo Taras,  [name], MD       aspirin EC tablet 81 mg  81 mg Oral Daily [name], MD   81 mg at 06/16/21 9371   atorvastatin (LIPITOR) tablet 40 mg  40 mg Oral Daily Kadance Mccuistion, [name], MD   40 mg at 06/16/21 0805   busPIRone (BUSPAR) tablet 20 mg  20 mg Oral TID Tyion Boylen, [name], MD   20 mg at 06/16/21 0804   clonazePAM (KLONOPIN) tablet 0.5 mg  0.5 mg Oral BID PRN Sharece Fleischhacker T, MD   0.5 mg at 06/13/21 1627   escitalopram (LEXAPRO) tablet 20 mg  20 mg Oral Daily Laine Giovanetti, [name], MD   20 mg at 06/16/21 0806   hydrALAZINE (APRESOLINE) tablet 10 mg  10 mg Oral Q6H PRN [name], MD   10 mg at 06/07/21 1404   hydrochlorothiazide (HYDRODIURIL) tablet 25 mg  25 mg Oral Daily Gladys Gutman, [name], MD   25 mg at 06/16/21 0805   hydrOXYzine (ATARAX/VISTARIL) tablet 25 mg  25  mg Oral TID PRN [name], MD   25 mg at 06/15/21 2133   ibuprofen (ADVIL) tablet 600 mg  600 mg Oral Q6H PRN Larita Fife, MD   600 mg at 06/15/21 2133   lisinopril (ZESTRIL) tablet 20 mg  20 mg Oral Daily [name], MD   20 mg at 06/16/21 0805   magnesium hydroxide (MILK OF MAGNESIA) suspension 30 mL  30 mL Oral Daily PRN Kaylinn Dedic, [name], MD       metFORMIN (GLUCOPHAGE) tablet 1,000 mg  1,000 mg Oral BID WC Latashia Koch T, MD   1,000 mg at 06/16/21 0805   mometasone-formoterol (DULERA) 200-5 MCG/ACT inhaler 2 puff  2 puff Inhalation BID Verda Mehta, [name], MD   2 puff at 06/16/21 0807   nicotine (NICODERM CQ - dosed in mg/24 hours) patch 21 mg  21 mg Transdermal Daily Kendle Erker, [name], MD   21 mg at 06/16/21 0807   pantoprazole (PROTONIX) EC tablet 40 mg  40 mg Oral Daily Benjie Ricketson T, MD   40 mg at 06/16/21 0805   QUEtiapine (SEROQUEL XR) 24 hr tablet 400 mg  400 mg Oral QHS Darleene Cumpian T, MD   400 mg at 06/15/21 2132   zolpidem (AMBIEN) tablet 10 mg  10 mg Oral QHS [name], MD   10 mg at 06/15/21 2132    Lab Results: No results found for this or any previous visit (from the past 48 hour(s)).  Blood Alcohol level:  Lab Results  Component Value Date   ETH <10 06/06/2021   ETH <10 69/67/8938    Metabolic Disorder Labs: Lab Results  Component Value Date   HGBA1C 7.4 (H) 06/08/2021   MPG 166 06/08/2021   MPG 165.68 01/04/2021   No results found for: PROLACTIN Lab Results  Component Value Date   CHOL 133 06/08/2021   TRIG 225 (H) 06/08/2021   HDL 24 (L) 06/08/2021   CHOLHDL 5.5 06/08/2021   VLDL 45 (H) 06/08/2021   LDLCALC 64 06/08/2021   LDLCALC 80 08/26/2019    Physical Findings: AIMS:  , ,  ,  ,    CIWA:    COWS:     Musculoskeletal: Strength & Muscle Tone: within normal limits Gait & Station: normal Patient leans: N/A  Psychiatric Specialty Exam:  Presentation  General Appearance: Disheveled  Eye Contact:Good  Speech:Normal  Rate  Speech Volume:Normal  Handedness:Right   Mood  and Affect  Mood:Anxious; Depressed  Affect:Congruent   Thought Process  Thought Processes:Coherent; Goal Directed  Descriptions of Associations:Intact  Orientation:Full (Time, Place and Person)  Thought Content:Logical  History of Schizophrenia/Schizoaffective disorder:No data recorded Duration of Psychotic Symptoms:No data recorded Hallucinations:No data recorded Ideas of Reference:None  Suicidal Thoughts:No data recorded Homicidal Thoughts:No data recorded  Sensorium  Memory:Immediate Good; Recent Good; Remote Good  Judgment:Intact  Insight:Fair   Executive Functions  Concentration:Good  Attention Span:Good  [place] of Knowledge:Good  Language:Good   Psychomotor Activity  Psychomotor Activity: No data recorded  Assets  Assets:Communication Skills; Desire for Improvement; Financial Resources/Insurance; Resilience; Social Support   Sleep  Sleep: No data recorded   Physical Exam: Physical Exam Vitals and nursing note reviewed.  Constitutional:      Appearance: Normal appearance.  HENT:     Head: Normocephalic and atraumatic.     Mouth/Throat:     Pharynx: Oropharynx is clear.  Eyes:     Pupils: Pupils are equal, round, and reactive to light.  Cardiovascular:     Rate and Rhythm: Normal rate and regular rhythm.  Pulmonary:     Effort: Pulmonary effort is normal.     Breath sounds: Normal breath sounds.  Abdominal:     General: Abdomen is flat.     Palpations: Abdomen is soft.  Musculoskeletal:        General: Normal range of motion.  Skin:    General: Skin is warm and dry.  Neurological:     General: No focal deficit present.     Mental Status: He is alert. Mental status is at baseline.  Psychiatric:        Mood and Affect: Mood normal.        Thought Content: Thought content normal.   Review of Systems  Constitutional: Negative.   HENT: Negative.    Eyes:  Negative.   Respiratory: Negative.    Cardiovascular: Negative.   Gastrointestinal: Negative.   Musculoskeletal: Negative.   Skin: Negative.   Neurological: Negative.   Psychiatric/Behavioral: Negative.    Blood pressure 117/81, pulse 87, temperature 97.7 F (36.5 C), temperature source Oral, resp. rate 19, height 6' (1.829 m), weight 131.1 kg, SpO2 96 %. Body mass index is 39.2 kg/m.   Treatment Plan Summary: Plan encouragement and review of treatment plan and medicine.  No change to any orders right now.  Psychoeducation and supportive counseling.  Alethia Berthold, MD 06/16/2021, 11:26 AM

## 2021-06-16 NOTE — Progress Notes (Signed)
Patient refused his inhaler, stating that he did not need it.

## 2021-06-16 NOTE — Plan of Care (Signed)
D- Patient alert and oriented. Patient presented in a pleasant mood on assessment requesting PRN pain medication for his neck and back. Patient rated his pain level a "6/10", stating that he believes "the arthritis has set in", in his lower back, and that Ibuprofen helps ease his pain a little. Patient endorsed both depression and anxiety, rating them a "5/10", stating that he always feels depressed. He didn't go into detail about why he was anxious. Patient denied SI, HI, AVH at this time. Patient had no stated goals for today, however, he did mention to this writer that he's leaving Friday.  A- Scheduled medications administered to patient, per MD orders. Support and encouragement provided.  Routine safety checks conducted every 15 minutes. Patient informed to notify staff with problems or concerns.  R- No adverse drug reactions noted. Patient contracts for safety at this time. Patient compliant with medications and treatment plan. Patient receptive, calm, and cooperative. Patient interacts well with others on the unit.  Patient remains safe at this time.  Problem: Education: Goal: Knowledge of [place] [facility] Education information/materials will improve Outcome: Progressing Goal: Emotional status will improve Outcome: Progressing Goal: Mental status will improve Outcome: Progressing Goal: Verbalization of understanding the information provided will improve Outcome: Progressing   Problem: Activity: Goal: Interest or engagement in activities will improve Outcome: Progressing Goal: Sleeping patterns will improve Outcome: Progressing   Problem: Coping: Goal: Ability to verbalize frustrations and anger appropriately will improve Outcome: Progressing Goal: Ability to demonstrate self-control will improve Outcome: Progressing   Problem: Health Behavior/Discharge Planning: Goal: Identification of resources available to assist in meeting health care needs will improve Outcome:  Progressing Goal: Compliance with treatment plan for underlying cause of condition will improve Outcome: Progressing   Problem: Physical Regulation: Goal: Ability to maintain clinical measurements within normal limits will improve Outcome: Progressing   Problem: Safety: Goal: Periods of time without injury will increase Outcome: Progressing   Problem: Education: Goal: Utilization of techniques to improve thought processes will improve Outcome: Progressing Goal: Knowledge of the prescribed therapeutic regimen will improve Outcome: Progressing   Problem: Activity: Goal: Interest or engagement in leisure activities will improve Outcome: Progressing Goal: Imbalance in normal sleep/wake cycle will improve Outcome: Progressing   Problem: Coping: Goal: Coping ability will improve Outcome: Progressing Goal: Will verbalize feelings Outcome: Progressing   Problem: Health Behavior/Discharge Planning: Goal: Ability to make decisions will improve Outcome: Progressing Goal: Compliance with therapeutic regimen will improve Outcome: Progressing   Problem: Role Relationship: Goal: Will demonstrate positive changes in social behaviors and relationships Outcome: Progressing   Problem: Safety: Goal: Ability to disclose and discuss suicidal ideas will improve Outcome: Progressing Goal: Ability to identify and utilize support systems that promote safety will improve Outcome: Progressing   Problem: Self-Concept: Goal: Will verbalize positive feelings about self Outcome: Progressing Goal: Level of anxiety will decrease Outcome: Progressing   Problem: Education: Goal: Ability to state activities that reduce stress will improve Outcome: Progressing   Problem: Coping: Goal: Ability to identify and develop effective coping behavior will improve Outcome: Progressing   Problem: Self-Concept: Goal: Ability to identify factors that promote anxiety will improve Outcome:  Progressing Goal: Level of anxiety will decrease Outcome: Progressing Goal: Ability to modify response to factors that promote anxiety will improve Outcome: Progressing

## 2021-06-16 NOTE — Progress Notes (Signed)
Patient stated that he feels better this morning. C/O back and neck  pain but stated he would like Ibuprofen later. Denies SI,HI and AVH.  Appropriate with staff & peers. ADLs maintained as well. Appetite and energy level good. Visible in the milieu. Support and encouragement given. Pt remains safe on the unit with q 15 minute safety checks.

## 2021-06-17 NOTE — Plan of Care (Signed)
Patient stated that he is little hopeful with his housing and trying to get new air condition. Appropriate with staff & peers. Visible in the milieu.Denies SI,HI and AVH. Vistaril x 1 helped with anxiety.Appetite and energy level good. Support and encouragement given.

## 2021-06-17 NOTE — Progress Notes (Signed)
Recreation Therapy Notes   Date: 06/17/2021  Time: 10:00 am   Location: Craft room      Behavioral response: N/A   Intervention Topic: Relaxation   Discussion/Intervention: Patient did not attend group.   Clinical Observations/Feedback:  Patient did not attend group.   Avacyn Kloosterman LRT/CTRS        Christell Steinmiller 06/17/2021 11:29 AM

## 2021-06-17 NOTE — [facility] Group Notes (Signed)
LCSW Group Therapy Note   06/17/2021 12:40 PM  Type of Therapy and Topic:  Group Therapy:  Overcoming Obstacles   Participation Level:  Did Not Attend   Description of Group:    In this group patients will be encouraged to explore what they see as obstacles to their own wellness and recovery. They will be guided to discuss their thoughts, feelings, and behaviors related to these obstacles. The group will process together ways to cope with barriers, with attention given to specific choices patients can make. Each patient will be challenged to identify changes they are motivated to make in order to overcome their obstacles. This group will be process-oriented, with patients participating in exploration of their own experiences as well as giving and receiving support and challenge from other group members.   Therapeutic Goals: Patient will identify personal and current obstacles as they relate to admission. Patient will identify barriers that currently interfere with their wellness or overcoming obstacles.  Patient will identify feelings, thought process and behaviors related to these barriers. Patient will identify two changes they are willing to make to overcome these obstacles:      Summary of Patient Progress X    Therapeutic Modalities:   Cognitive Behavioral Therapy Solution Focused Therapy Motivational Interviewing Relapse Prevention Therapy  Assunta Curtis, MSW, LCSW 06/17/2021 12:40 PM

## 2021-06-17 NOTE — Progress Notes (Signed)
[facility] MD Progress Note  06/17/2021 12:29 PM Ian [name]  MRN:  [id number]  Principal Problem: Severe recurrent major depression without psychotic features [facility]) Diagnosis: Principal Problem:   Severe recurrent major depression without psychotic features ([place]) Active Problems:   HTN (hypertension)   Dyslipidemia   Generalized anxiety disorder   Type 2 diabetes mellitus with hyperglycemia ([place])  Total Time spent with patient: 15 minutes  CC "Starting to have a tiny bit of hope."  Ian [name] is a  59y.o. male that has a previous psychiatric history of depression who presents to the [facility] unit for treatment of depression and suicidal ideations.  Interval History Patient was seen today for re-evaluation.  Nursing reports no events overnight. The patient has no issues with performing ADLs.  Patient has been medication compliant.    Subjective: Patient seen one-on-one again today.  Patient's blood pressure has been normotensive since decreasing his dose of lisinopril.  He has had no further episodes of dizziness or orthostatic hypotension.  He notes his peers support specialist has been helping him try to find different section 8 housing to apply to.  He is feeling somewhat more hopeful about the future now.  He continues to have some passive SI but denies any homicidal ideations, visual hallucinations, auditory hallucinations.  No side effects to current medications.  Past Psychiatric History: see H&P  Past Medical History:  Past Medical History:  Diagnosis Date  . Anxiety   . Barrett esophagus   . Cancer ([place])   . COPD (chronic obstructive pulmonary disease) ([place])   . Coronary artery disease   . Depression   . GERD (gastroesophageal reflux disease)   . Hypertension   . Pre-diabetes   . Stroke [facility]) 2014   "mini-stroke" per patient    Past Surgical History:  Procedure Laterality Date  . ANGIOPLASTY    . APPENDECTOMY    . CARDIAC CATHETERIZATION    . CHOLECYSTECTOMY    .  WRIST SURGERY Right    age 17   Family History:  Family History  Problem Relation Age of Onset  . Stroke Father   . Leukemia Father   . Breast cancer Sister    Family Psychiatric  History: see H&P Social History:  Social History   Substance and Sexual Activity  Alcohol Use Not Currently     Social History   Substance and Sexual Activity  Drug Use Yes  . Types: Cocaine, Marijuana    Social History   Socioeconomic History  . Marital status: Divorced    Spouse name: Not on file  . Number of children: Not on file  . Years of education: Not on file  . Highest education level: Not on file  Occupational History  . Not on file  Tobacco Use  . Smoking status: Every Day    Packs/day: 1.50    Pack years: 0.00    Types: Cigarettes  . Smokeless tobacco: Never  Vaping Use  . Vaping Use: Never used  Substance and Sexual Activity  . Alcohol use: Not Currently  . Drug use: Yes    Types: Cocaine, Marijuana  . Sexual activity: Not Currently  Other Topics Concern  . Not on file  Social History Narrative  . Not on file   Social Determinants of Health   Financial Resource Strain: Not on file  Food Insecurity: Not on file  Transportation Needs: Not on file  Physical Activity: Not on file  Stress: Not on file  Social Connections: Not on file  Additional Social History:                         Sleep: Good  Appetite:  Good  Current Medications: Current Facility-Administered Medications  Medication Dose Route Frequency Provider Last Rate Last Admin  . albuterol (VENTOLIN HFA) 108 (90 Base) MCG/ACT inhaler 2 puff  2 puff Inhalation Q6H PRN [name], [name], MD   2 puff at 06/15/21 1216  . alum & mag hydroxide-simeth (MAALOX/MYLANTA) 200-200-20 MG/5ML suspension 30 mL  30 mL Oral Q4H PRN [name], [name], MD      . aspirin EC tablet 81 mg  81 mg Oral Daily [name], MD   81 mg at 06/17/21 0749  . atorvastatin (LIPITOR) tablet 40 mg  40 mg Oral Daily  [name], [name], MD   40 mg at 06/17/21 0748  . busPIRone (BUSPAR) tablet 20 mg  20 mg Oral TID [name], [name], MD   20 mg at 06/17/21 1142  . clonazePAM (KLONOPIN) tablet 0.5 mg  0.5 mg Oral BID PRN [name], [name], MD   0.5 mg at 06/16/21 1211  . escitalopram (LEXAPRO) tablet 20 mg  20 mg Oral Daily [name], [name], MD   20 mg at 06/17/21 0748  . hydrALAZINE (APRESOLINE) tablet 10 mg  10 mg Oral Q6H PRN [name], MD   10 mg at 06/07/21 1404  . hydrochlorothiazide (HYDRODIURIL) tablet 25 mg  25 mg Oral Daily [name], [name], MD   25 mg at 06/17/21 0749  . hydrOXYzine (ATARAX/VISTARIL) tablet 25 mg  25 mg Oral TID PRN [name], MD   25 mg at 06/17/21 1142  . ibuprofen (ADVIL) tablet 600 mg  600 mg Oral Q6H PRN Larita Fife, MD   600 mg at 06/16/21 2139  . lisinopril (ZESTRIL) tablet 20 mg  20 mg Oral Daily [name], MD   20 mg at 06/17/21 0748  . magnesium hydroxide (MILK OF MAGNESIA) suspension 30 mL  30 mL Oral Daily PRN [name], [name], MD      . metFORMIN (GLUCOPHAGE) tablet 1,000 mg  1,000 mg Oral BID WC [name], [name], MD   1,000 mg at 06/17/21 0748  . mometasone-formoterol (DULERA) 200-5 MCG/ACT inhaler 2 puff  2 puff Inhalation BID [name], [name], MD   2 puff at 06/17/21 0748  . nicotine (NICODERM CQ - dosed in mg/24 hours) patch 21 mg  21 mg Transdermal Daily [name], [name], MD   21 mg at 06/17/21 0747  . pantoprazole (PROTONIX) EC tablet 40 mg  40 mg Oral Daily [name], [name], MD   40 mg at 06/17/21 0748  . QUEtiapine (SEROQUEL XR) 24 hr tablet 400 mg  400 mg Oral QHS [name], [name], MD   400 mg at 06/16/21 2139  . zolpidem (AMBIEN) tablet 10 mg  10 mg Oral QHS [name], MD   10 mg at 06/16/21 2139    Lab Results:  No results found for this or any previous visit (from the past 48 hour(s)).   Blood Alcohol level:  Lab Results  Component Value Date   [facility] <10 06/06/2021   ETH <10 41/93/7902    Metabolic Disorder Labs: Lab Results  Component  Value Date   HGBA1C 7.4 (H) 06/08/2021   MPG 166 06/08/2021   MPG 165.68 01/04/2021   No results found for: PROLACTIN Lab Results  Component Value Date   CHOL  133 06/08/2021   TRIG 225 (H) 06/08/2021   HDL 24 (L) 06/08/2021   CHOLHDL 5.5 06/08/2021   VLDL 45 (H) 06/08/2021   LDLCALC 64 06/08/2021   LDLCALC 80 08/26/2019    Physical Findings: AIMS:  , ,  ,  ,    CIWA:    COWS:     Musculoskeletal: Strength & Muscle Tone: within normal limits Gait & Station: normal Patient leans: N/A  Psychiatric Specialty Exam: Physical Exam Vitals and nursing note reviewed.    Review of Systems  Blood pressure 125/84, pulse 84, temperature 97.7 F (36.5 C), temperature source Oral, resp. rate 18, height 6' (1.829 m), weight 131.1 kg, SpO2 94 %.Body mass index is 39.2 kg/m.  General Appearance: Casual  Eye Contact:  Good  Speech:  Clear and Coherent and Normal Rate  Volume:  Normal  Mood:  Depressed  Affect:  Congruent and Constricted  Thought Process:  Coherent, Goal Directed and Linear  Orientation:  Full (Time, Place, and Person)  Thought Content:  Logical  Suicidal Thoughts:  Yes, passive SI  Homicidal Thoughts:  No  Memory:  Immediate;   Fair Recent;   Fair Remote;   Fair  Judgement:  Fair  Insight:  Fair  Psychomotor Activity:  Normal  Concentration:  Concentration: Fair and Attention Span: Fair  Recall:  [employer] of Knowledge:  Fair  Language:  Fair  Akathisia:  No  Handed:  Right  AIMS (if indicated):     Assets:  Communication Skills Desire for Improvement Resilience Social Support  ADL's:  Intact  Cognition:  WNL  Sleep:  Number of Hours: 7.5     Treatment Plan Summary: Daily contact with patient to assess and evaluate symptoms and progress in treatment and Medication management    Patient is a 60 year old male with the above-stated past psychiatric and medical history who was admitted to [facility] unit secondary to worsened depression and suicidal  ideations.  Today continues to reports feeling depressed and anxious due to unstable housing situation; denies active suicidal thoughts, plans. No change to medications.    Plan:  -continue inpatient psych admission; 15-minute checks; daily contact with patient to assess and evaluate symptoms and progress in treatment; psychoeducation.  -continue scheduled medications: . aspirin EC  81 mg Oral Daily  . atorvastatin  40 mg Oral Daily  . busPIRone  20 mg Oral TID  . escitalopram  20 mg Oral Daily  . hydrochlorothiazide  25 mg Oral Daily  . lisinopril  20 mg Oral Daily  . metFORMIN  1,000 mg Oral BID WC  . mometasone-formoterol  2 puff Inhalation BID  . nicotine  21 mg Transdermal Daily  . pantoprazole  40 mg Oral Daily  . QUEtiapine  400 mg Oral QHS  . zolpidem  10 mg Oral QHS     -continue PRN medications.  albuterol, alum & mag hydroxide-simeth, clonazePAM, hydrALAZINE, hydrOXYzine, ibuprofen, magnesium hydroxide  -Pertinent Labs: hemoglobin a1c 7.4, diet counseling provided    -Consults: No new consults placed since yesterday    -Disposition: Estimated duration of hospitalization: end of week.  Social work consult. All necessary aftercare will be arranged prior to discharge Likely d/c home with outpatient psych follow-up.  -  I certify that the patient does need, on a daily basis, active treatment furnished directly by or requiring the supervision of inpatient psychiatric facility personnel.    06/17/21: Psychiatric exam above reviewed and remains accurate. Assessment and plan above reviewed and updated.  [name], MD 06/17/2021, 12:29 PM Physical Exam Vitals and nursing note reviewed.   ROS

## 2021-06-17 NOTE — Plan of Care (Signed)
D- Patient alert and oriented. Patient presents in an anxious, but pleasant mood on assessment reporting that he has a migraine, behind his right eye, and is wanting some relief. Patient rated his pain level a "4/10", however, it was too early for PRN medication administration. Patient rated his depression and anxiety "pretty high" because of "so much going on here today". Patient denies SI, HI, AVH, at this time.  A- Scheduled medications administered to patient, per MD orders. Support and encouragement provided.  Routine safety checks conducted every 15 minutes.  Patient informed to notify staff with problems or concerns.  R- No adverse drug reactions noted. Patient contracts for safety at this time. Patient compliant with medications and treatment plan. Patient receptive, calm, and cooperative. Patient interacts well with others on the unit.  Patient remains safe at this time.  Problem: Education: Goal: Knowledge of [place] [facility] Education information/materials will improve Outcome: Progressing Goal: Emotional status will improve Outcome: Progressing Goal: Mental status will improve Outcome: Progressing Goal: Verbalization of understanding the information provided will improve Outcome: Progressing   Problem: Activity: Goal: Interest or engagement in activities will improve Outcome: Progressing Goal: Sleeping patterns will improve Outcome: Progressing   Problem: Coping: Goal: Ability to verbalize frustrations and anger appropriately will improve Outcome: Progressing Goal: Ability to demonstrate self-control will improve Outcome: Progressing   Problem: Health Behavior/Discharge Planning: Goal: Identification of resources available to assist in meeting health care needs will improve Outcome: Progressing Goal: Compliance with treatment plan for underlying cause of condition will improve Outcome: Progressing   Problem: Physical Regulation: Goal: Ability to maintain clinical  measurements within normal limits will improve Outcome: Progressing   Problem: Safety: Goal: Periods of time without injury will increase Outcome: Progressing   Problem: Education: Goal: Utilization of techniques to improve thought processes will improve Outcome: Progressing Goal: Knowledge of the prescribed therapeutic regimen will improve Outcome: Progressing   Problem: Activity: Goal: Interest or engagement in leisure activities will improve Outcome: Progressing Goal: Imbalance in normal sleep/wake cycle will improve Outcome: Progressing   Problem: Coping: Goal: Coping ability will improve Outcome: Progressing Goal: Will verbalize feelings Outcome: Progressing   Problem: Health Behavior/Discharge Planning: Goal: Ability to make decisions will improve Outcome: Progressing Goal: Compliance with therapeutic regimen will improve Outcome: Progressing   Problem: Role Relationship: Goal: Will demonstrate positive changes in social behaviors and relationships Outcome: Progressing   Problem: Safety: Goal: Ability to disclose and discuss suicidal ideas will improve Outcome: Progressing Goal: Ability to identify and utilize support systems that promote safety will improve Outcome: Progressing   Problem: Self-Concept: Goal: Will verbalize positive feelings about self Outcome: Progressing Goal: Level of anxiety will decrease Outcome: Progressing   Problem: Education: Goal: Ability to state activities that reduce stress will improve Outcome: Progressing   Problem: Coping: Goal: Ability to identify and develop effective coping behavior will improve Outcome: Progressing   Problem: Self-Concept: Goal: Ability to identify factors that promote anxiety will improve Outcome: Progressing Goal: Level of anxiety will decrease Outcome: Progressing Goal: Ability to modify response to factors that promote anxiety will improve Outcome: Progressing

## 2021-06-17 NOTE — BHH Counselor (Signed)
CSW contacted [facility] to see about them providing funds for A/C window unit on Friday, 06/14/21. CSW was updated that [employer] rejected the request on Monday, 06/17/21.   Chalmers Guest. Guerry Bruin, MSW, [place], [place] 06/17/2021 1:26 PM

## 2021-06-18 MED ORDER — BUSPIRONE HCL 10 MG PO TABS: 20.0000 mg | ORAL_TABLET | Freq: Three times a day (TID) | ORAL | 1 refills | Status: AC

## 2021-06-18 MED ORDER — ESCITALOPRAM OXALATE 20 MG PO TABS: 20.0000 mg | ORAL_TABLET | Freq: Every day | ORAL | 1 refills | Status: AC

## 2021-06-18 MED ORDER — CLONAZEPAM 0.5 MG PO TABS: 0.5000 mg | ORAL_TABLET | Freq: Two times a day (BID) | ORAL | 1 refills | Status: DC | PRN

## 2021-06-18 MED ORDER — LISINOPRIL 20 MG PO TABS: 20.0000 mg | ORAL_TABLET | Freq: Every day | ORAL | 1 refills | Status: AC

## 2021-06-18 MED ORDER — HYDROXYZINE HCL 25 MG PO TABS: 25.0000 mg | ORAL_TABLET | Freq: Three times a day (TID) | ORAL | 1 refills | Status: AC | PRN

## 2021-06-18 MED ORDER — ZOLPIDEM TARTRATE 10 MG PO TABS: 10.0000 mg | ORAL_TABLET | Freq: Every evening | ORAL | 1 refills | Status: AC | PRN

## 2021-06-18 MED ORDER — METFORMIN HCL ER (MOD) 1000 MG PO TB24: 1000.0000 mg | ORAL_TABLET | Freq: Two times a day (BID) | ORAL | 1 refills | Status: DC

## 2021-06-18 MED ORDER — QUETIAPINE FUMARATE ER 400 MG PO TB24: 400.0000 mg | ORAL_TABLET | Freq: Every day | ORAL | 1 refills | Status: AC

## 2021-06-18 NOTE — Progress Notes (Signed)
[facility] MD Progress Note  06/18/2021 11:19 AM Dakwan Pridgen  MRN:  [id number]  Principal Problem: Severe recurrent major depression without psychotic features [facility]) Diagnosis: Principal Problem:   Severe recurrent major depression without psychotic features ([place]) Active Problems:   HTN (hypertension)   Dyslipidemia   Generalized anxiety disorder   Type 2 diabetes mellitus with hyperglycemia ([place])  Total Time spent with patient: 15 minutes  CC "I'm okay."  Mr.Haney is a  60y.o. male that has a previous psychiatric history of depression who presents to the [facility] unit for treatment of depression and suicidal ideations.  Interval History Patient was seen today for re-evaluation.  Nursing reports no events overnight. The patient has no issues with performing ADLs.  Patient has been medication compliant.    Subjective: Patient seen one-on-one again today.  Patient notes that he had increasing anxiety and a migraine headache secondary to another patient yesterday.  He continues to have some passive SI but denies any active plan or intent.  He denies any homicidal ideations, visual hallucinations, auditory hallucinations.  He has remained in touch with his peers support specialist who is helping him acquire a new AC unit and to continue a section 8 housing applications.  Anticipate discharge this Friday.  Past Psychiatric History: see H&P  Past Medical History:  Past Medical History:  Diagnosis Date  . Anxiety   . Barrett esophagus   . Cancer ([place])   . COPD (chronic obstructive pulmonary disease) ([place])   . Coronary artery disease   . Depression   . GERD (gastroesophageal reflux disease)   . Hypertension   . Pre-diabetes   . Stroke [facility]) 2014   "mini-stroke" per patient    Past Surgical History:  Procedure Laterality Date  . ANGIOPLASTY    . APPENDECTOMY    . CARDIAC CATHETERIZATION    . CHOLECYSTECTOMY    . WRIST SURGERY Right    age 39   Family History:  Family History   Problem Relation Age of Onset  . Stroke Father   . Leukemia Father   . Breast cancer Sister    Family Psychiatric  History: see H&P Social History:  Social History   Substance and Sexual Activity  Alcohol Use Not Currently     Social History   Substance and Sexual Activity  Drug Use Yes  . Types: Cocaine, Marijuana    Social History   Socioeconomic History  . Marital status: Divorced    Spouse name: Not on file  . Number of children: Not on file  . Years of education: Not on file  . Highest education level: Not on file  Occupational History  . Not on file  Tobacco Use  . Smoking status: Every Day    Packs/day: 1.50    Pack years: 0.00    Types: Cigarettes  . Smokeless tobacco: Never  Vaping Use  . Vaping Use: Never used  Substance and Sexual Activity  . Alcohol use: Not Currently  . Drug use: Yes    Types: Cocaine, Marijuana  . Sexual activity: Not Currently  Other Topics Concern  . Not on file  Social History Narrative  . Not on file   Social Determinants of Health   Financial Resource Strain: Not on file  Food Insecurity: Not on file  Transportation Needs: Not on file  Physical Activity: Not on file  Stress: Not on file  Social Connections: Not on file   Additional Social History:  Sleep: Good  Appetite:  Good  Current Medications: Current Facility-Administered Medications  Medication Dose Route Frequency Provider Last Rate Last Admin  . albuterol (VENTOLIN HFA) 108 (90 Base) MCG/ACT inhaler 2 puff  2 puff Inhalation Q6H PRN [name], [name], MD   2 puff at 06/15/21 1216  . alum & mag hydroxide-simeth (MAALOX/MYLANTA) 200-200-20 MG/5ML suspension 30 mL  30 mL Oral Q4H PRN [name], [name], MD      . aspirin EC tablet 81 mg  81 mg Oral Daily [name], MD   81 mg at 06/18/21 0809  . atorvastatin (LIPITOR) tablet 40 mg  40 mg Oral Daily [name], [name], MD   40 mg at 06/18/21 0809  . busPIRone (BUSPAR) tablet  20 mg  20 mg Oral TID [name], [name], MD   20 mg at 06/18/21 0810  . clonazePAM (KLONOPIN) tablet 0.5 mg  0.5 mg Oral BID PRN [name], [name], MD   0.5 mg at 06/17/21 1941  . escitalopram (LEXAPRO) tablet 20 mg  20 mg Oral Daily [name], [name], MD   20 mg at 06/18/21 0809  . hydrALAZINE (APRESOLINE) tablet 10 mg  10 mg Oral Q6H PRN [name], MD   10 mg at 06/07/21 1404  . hydrochlorothiazide (HYDRODIURIL) tablet 25 mg  25 mg Oral Daily [name], [name], MD   25 mg at 06/18/21 0810  . hydrOXYzine (ATARAX/VISTARIL) tablet 25 mg  25 mg Oral TID PRN [name], MD   25 mg at 06/17/21 1142  . ibuprofen (ADVIL) tablet 600 mg  600 mg Oral Q6H PRN Larita Fife, MD   600 mg at 06/18/21 0810  . lisinopril (ZESTRIL) tablet 20 mg  20 mg Oral Daily [name], MD   20 mg at 06/18/21 0809  . magnesium hydroxide (MILK OF MAGNESIA) suspension 30 mL  30 mL Oral Daily PRN [name], [name], MD      . metFORMIN (GLUCOPHAGE) tablet 1,000 mg  1,000 mg Oral BID WC [name], [name], MD   1,000 mg at 06/18/21 0809  . mometasone-formoterol (DULERA) 200-5 MCG/ACT inhaler 2 puff  2 puff Inhalation BID [name], [name], MD   2 puff at 06/18/21 [phone]  . nicotine (NICODERM CQ - dosed in mg/24 hours) patch 21 mg  21 mg Transdermal Daily [name], [name], MD   21 mg at 06/18/21 0810  . pantoprazole (PROTONIX) EC tablet 40 mg  40 mg Oral Daily [name], [name], MD   40 mg at 06/18/21 0809  . QUEtiapine (SEROQUEL XR) 24 hr tablet 400 mg  400 mg Oral QHS [name], [name], MD   400 mg at 06/17/21 2144  . zolpidem (AMBIEN) tablet 10 mg  10 mg Oral QHS [name], MD   10 mg at 06/17/21 2144    Lab Results:  No results found for this or any previous visit (from the past 48 hour(s)).   Blood Alcohol level:  Lab Results  Component Value Date   ETH <10 06/06/2021   ETH <10 24/08/7352    Metabolic Disorder Labs: Lab Results  Component Value Date   HGBA1C 7.4 (H) 06/08/2021   MPG 166 06/08/2021   MPG 165.68  01/04/2021   No results found for: PROLACTIN Lab Results  Component Value Date   CHOL 133 06/08/2021   TRIG 225 (H) 06/08/2021   HDL 24 (L) 06/08/2021   CHOLHDL 5.5 06/08/2021   VLDL 45 (H) 06/08/2021  [place] 64 06/08/2021   [place] 80 08/26/2019    Physical Findings: AIMS:  , ,  ,  ,    CIWA:    COWS:     Musculoskeletal: Strength & Muscle Tone: within normal limits Gait & Station: normal Patient leans: N/A  Psychiatric Specialty Exam: Physical Exam Vitals and nursing note reviewed.    Review of Systems  Blood pressure 111/81, pulse 94, temperature 97.7 F (36.5 C), temperature source Oral, resp. rate 17, height 6' (1.829 m), weight 131.1 kg, SpO2 96 %.Body mass index is 39.2 kg/m.  General Appearance: Casual  Eye Contact:  Good  Speech:  Clear and Coherent and Normal Rate  Volume:  Normal  Mood:  Depressed  Affect:  Congruent and Constricted  Thought Process:  Coherent, Goal Directed and Linear  Orientation:  Full (Time, Place, and Person)  Thought Content:  Logical  Suicidal Thoughts:  Yes, passive SI  Homicidal Thoughts:  No  Memory:  Immediate;   Fair Recent;   Fair Remote;   Fair  Judgement:  Fair  Insight:  Fair  Psychomotor Activity:  Normal  Concentration:  Concentration: Fair and Attention Span: Fair  Recall:  [employer] of Knowledge:  Fair  Language:  Fair  Akathisia:  No  Handed:  Right  AIMS (if indicated):     Assets:  Communication Skills Desire for Improvement Resilience Social Support  ADL's:  Intact  Cognition:  WNL  Sleep:  Number of Hours: 6     Treatment Plan Summary: Daily contact with patient to assess and evaluate symptoms and progress in treatment and Medication management    Patient is a 60 year old male with the above-stated past psychiatric and medical history who was admitted to [facility] unit secondary to worsened depression and suicidal ideations.  Today continues to reports feeling depressed and anxious due to unstable  housing situation; denies active suicidal thoughts, plans. No change to medications.    Plan:  -continue inpatient psych admission; 15-minute checks; daily contact with patient to assess and evaluate symptoms and progress in treatment; psychoeducation.  -continue scheduled medications: . aspirin EC  81 mg Oral Daily  . atorvastatin  40 mg Oral Daily  . busPIRone  20 mg Oral TID  . escitalopram  20 mg Oral Daily  . hydrochlorothiazide  25 mg Oral Daily  . lisinopril  20 mg Oral Daily  . metFORMIN  1,000 mg Oral BID WC  . mometasone-formoterol  2 puff Inhalation BID  . nicotine  21 mg Transdermal Daily  . pantoprazole  40 mg Oral Daily  . QUEtiapine  400 mg Oral QHS  . zolpidem  10 mg Oral QHS     -continue PRN medications.  albuterol, alum & mag hydroxide-simeth, clonazePAM, hydrALAZINE, hydrOXYzine, ibuprofen, magnesium hydroxide  -Pertinent Labs: hemoglobin a1c 7.4, diet counseling provided    -Consults: No new consults placed since yesterday    -Disposition: Estimated duration of hospitalization: end of week.  Social work consult. All necessary aftercare will be arranged prior to discharge Likely d/c home with outpatient psych follow-up.  -  I certify that the patient does need, on a daily basis, active treatment furnished directly by or requiring the supervision of inpatient psychiatric facility personnel.    06/18/21: Psychiatric exam above reviewed and remains accurate. Assessment and plan above reviewed and updated.      [name], MD 06/18/2021, 11:19 AM Physical Exam Vitals and nursing note reviewed.   ROS

## 2021-06-18 NOTE — Plan of Care (Signed)
  Problem: Group Participation Goal: STG - Patient will engage in groups without prompting or encouragement from LRT x3 group sessions within 5 recreation therapy group sessions Description: STG - Patient will engage in groups without prompting or encouragement from LRT x3 group sessions within 5 recreation therapy group sessions Outcome: Not Progressing   

## 2021-06-18 NOTE — [facility] Group Notes (Signed)
LCSW Group Therapy Note  06/18/2021 1:34 PM  Type of Therapy/Topic:  Group Therapy:  Feelings about Diagnosis  Participation Level:  Did Not Attend   Description of Group:   This group will allow patients to explore their thoughts and feelings about diagnoses they have received. Patients will be guided to explore their level of understanding and acceptance of these diagnoses. Facilitator will encourage patients to process their thoughts and feelings about the reactions of others to their diagnosis and will guide patients in identifying ways to discuss their diagnosis with significant others in their lives. This group will be process-oriented, with patients participating in exploration of their own experiences, giving and receiving support, and processing challenge from other group members.   Therapeutic Goals: Patient will demonstrate understanding of diagnosis as evidenced by identifying two or more symptoms of the disorder Patient will be able to express two feelings regarding the diagnosis Patient will demonstrate their ability to communicate their needs through discussion and/or role play  Summary of Patient Progress: X  Therapeutic Modalities:   Cognitive Behavioral Therapy Brief Therapy Feelings Identification   Ian [name] R. Guerry Bruin, MSW, LCSW, [place] 06/18/2021 1:34 PM

## 2021-06-18 NOTE — [facility] Group Notes (Signed)
[facility] Group Notes:  (Nursing/MHT/Case Management/Adjunct)  Date:  06/18/2021  Time:  9:46 PM  Type of Therapy:  Group Therapy  Participation Level:  Active  Participation Quality:  Appropriate  Affect:  Appropriate  Cognitive:  Alert  Insight:  Good  Engagement in Group:  Engaged and talk about leaving being D/C.  Modes of Intervention:  Support  Summary of Progress/Problems:  Ian [name] 06/18/2021, 9:46 PM

## 2021-06-18 NOTE — Progress Notes (Signed)
Recreation Therapy Notes   Date: 06/18/2021  Time: 10:00 am   Location: Craft room      Behavioral response: N/A   Intervention Topic: Goals   Discussion/Intervention: Patient did not attend group.   Clinical Observations/Feedback:  Patient did not attend group.   Lashaunda Schild LRT/CTRS        Destine Ambroise 06/18/2021 11:32 AM

## 2021-06-18 NOTE — Progress Notes (Signed)
D: Pt alert and oriented. Pt rates depression 5/10, hopelessness 5/10, and anxiety 9/10. Pt reports energy level as normal and concentration as being good. Pt reports sleep last night as being poor. Pt did receive medications for sleep and did not find them helpful. Pt reports experiencing 5/10 chronic neck and back pain, prn meds given. Pt denies experiencing any SI/HI, or AVH at this time.  Pt has some anxiety about discharging and getting into new housing. Pt understands that he has a poor living situation. Pt shares he plans to purchase a new ac unit just incase he's unable to get into new housing right away.    A: Scheduled medications administered to pt, per MD orders. Support and encouragement provided. Frequent verbal contact made. Routine safety checks conducted q15 minutes.   R: No adverse drug reactions noted. Pt verbally contracts for safety at this time. Pt complaint with medications. Pt interacts well with others on the unit. Pt remains safe at this time. Will continue to monitor.

## 2021-06-19 MED ORDER — PANTOPRAZOLE SODIUM 40 MG PO TBEC: 40.0000 mg | DELAYED_RELEASE_TABLET | Freq: Every day | ORAL | 1 refills | Status: AC

## 2021-06-19 NOTE — Progress Notes (Signed)
Recreation Therapy Notes  Date: 06/19/2021  Time: 9:30 am   Location: Courtyard  Behavioral response: N/A   Intervention Topic: Social- Skills   Discussion/Intervention: Patient did not attend group.   Clinical Observations/Feedback:  Patient did not attend group.   Majd Tissue LRT/CTRS        Vaidehi Braddy 06/19/2021 11:06 AM

## 2021-06-19 NOTE — [facility] Group Notes (Signed)
LCSW Group Therapy Note  06/19/2021 2:12 PM  Type of Therapy/Topic:  Group Therapy:  Emotion Regulation  Participation Level:  Did Not Attend   Description of Group:   The purpose of this group is to assist patients in learning to regulate negative emotions and experience positive emotions. Patients will be guided to discuss ways in which they have been vulnerable to their negative emotions. These vulnerabilities will be juxtaposed with experiences of positive emotions or situations, and patients will be challenged to use positive emotions to combat negative ones. Special emphasis will be placed on coping with negative emotions in conflict situations, and patients will process healthy conflict resolution skills.  Therapeutic Goals: Patient will identify two positive emotions or experiences to reflect on in order to balance out negative emotions Patient will label two or more emotions that they find the most difficult to experience Patient will demonstrate positive conflict resolution skills through discussion and/or role plays  Summary of Patient Progress: Patient did not attend group despite encouraged participation.     Therapeutic Modalities:   Cognitive Behavioral Therapy Feelings Identification Dialectical Behavioral Therapy  Paulla Dolly, MSW, [place], [place] 06/19/2021 2:12 PM

## 2021-06-19 NOTE — Progress Notes (Signed)
[facility] MD Progress Note  06/19/2021 2:13 PM Ian [name]  MRN:  [id number]  Principal Problem: Severe recurrent major depression without psychotic features [facility]) Diagnosis: Principal Problem:   Severe recurrent major depression without psychotic features ([place]) Active Problems:   HTN (hypertension)   Dyslipidemia   Generalized anxiety disorder   Type 2 diabetes mellitus with hyperglycemia (Ian [name])  Total Time spent with patient: 15 minutes  CC "I'm good."  IanLloyd is a  60y.o. male that has a previous psychiatric history of depression who presents to the [facility] unit for treatment of depression and suicidal ideations.  Interval History Patient was seen today for re-evaluation.  Nursing reports no events overnight. The patient has no issues with performing ADLs.  Patient has been medication compliant.    Subjective: Patient seen one-on-one again today. He notes that he is feeling somewhat better.  He continues to plan on discharge Friday.  Suicidal ideations are lessening.  Denies homicidal ideations, visual hallucinations, auditory hallucinations.  Past Psychiatric History: see H&P  Past Medical History:  Past Medical History:  Diagnosis Date  . Anxiety   . Barrett esophagus   . Cancer ([place])   . COPD (chronic obstructive pulmonary disease) ([place])   . Coronary artery disease   . Depression   . GERD (gastroesophageal reflux disease)   . Hypertension   . Pre-diabetes   . Stroke [facility]) 2014   "mini-stroke" per patient    Past Surgical History:  Procedure Laterality Date  . ANGIOPLASTY    . APPENDECTOMY    . CARDIAC CATHETERIZATION    . CHOLECYSTECTOMY    . WRIST SURGERY Right    age 32   Family History:  Family History  Problem Relation Age of Onset  . Stroke Father   . Leukemia Father   . Breast cancer Sister    Family Psychiatric  History: see H&P Social History:  Social History   Substance and Sexual Activity  Alcohol Use Not Currently     Social History    Substance and Sexual Activity  Drug Use Yes  . Types: Cocaine, Marijuana    Social History   Socioeconomic History  . Marital status: Divorced    Spouse name: Not on file  . Number of children: Not on file  . Years of education: Not on file  . Highest education level: Not on file  Occupational History  . Not on file  Tobacco Use  . Smoking status: Every Day    Packs/day: 1.50    Pack years: 0.00    Types: Cigarettes  . Smokeless tobacco: Never  Vaping Use  . Vaping Use: Never used  Substance and Sexual Activity  . Alcohol use: Not Currently  . Drug use: Yes    Types: Cocaine, Marijuana  . Sexual activity: Not Currently  Other Topics Concern  . Not on file  Social History Narrative  . Not on file   Social Determinants of Health   Financial Resource Strain: Not on file  Food Insecurity: Not on file  Transportation Needs: Not on file  Physical Activity: Not on file  Stress: Not on file  Social Connections: Not on file   Additional Social History:                         Sleep: Good  Appetite:  Good  Current Medications: Current Facility-Administered Medications  Medication Dose Route Frequency Provider Last Rate Last Admin  . albuterol (VENTOLIN HFA) 108 (90  Base) MCG/ACT inhaler 2 puff  2 puff Inhalation Q6H PRN [name], [name], MD   2 puff at 06/15/21 1216  . alum & mag hydroxide-simeth (MAALOX/MYLANTA) 200-200-20 MG/5ML suspension 30 mL  30 mL Oral Q4H PRN [name], [name], MD      . aspirin EC tablet 81 mg  81 mg Oral Daily [name], MD   81 mg at 06/19/21 0759  . atorvastatin (LIPITOR) tablet 40 mg  40 mg Oral Daily [name], [name], MD   40 mg at 06/19/21 0759  . busPIRone (BUSPAR) tablet 20 mg  20 mg Oral TID [name], [name], MD   20 mg at 06/19/21 1205  . clonazePAM (KLONOPIN) tablet 0.5 mg  0.5 mg Oral BID PRN [name], [name], MD   0.5 mg at 06/17/21 1941  . escitalopram (LEXAPRO) tablet 20 mg  20 mg Oral Daily [name], [name], MD    20 mg at 06/19/21 0759  . hydrALAZINE (APRESOLINE) tablet 10 mg  10 mg Oral Q6H PRN [name], MD   10 mg at 06/07/21 1404  . hydrochlorothiazide (HYDRODIURIL) tablet 25 mg  25 mg Oral Daily [name], [name], MD   25 mg at 06/19/21 0759  . hydrOXYzine (ATARAX/VISTARIL) tablet 25 mg  25 mg Oral TID PRN [name], MD   25 mg at 06/17/21 1142  . ibuprofen (ADVIL) tablet 600 mg  600 mg Oral Q6H PRN Larita Fife, MD   600 mg at 06/19/21 1026  . lisinopril (ZESTRIL) tablet 20 mg  20 mg Oral Daily [name], MD   20 mg at 06/19/21 0759  . magnesium hydroxide (MILK OF MAGNESIA) suspension 30 mL  30 mL Oral Daily PRN [name], [name], MD      . metFORMIN (GLUCOPHAGE) tablet 1,000 mg  1,000 mg Oral BID WC [name], [name], MD   1,000 mg at 06/19/21 0759  . mometasone-formoterol (DULERA) 200-5 MCG/ACT inhaler 2 puff  2 puff Inhalation BID [name], [name], MD   2 puff at 06/19/21 0800  . nicotine (NICODERM CQ - dosed in mg/24 hours) patch 21 mg  21 mg Transdermal Daily [name], [name], MD   21 mg at 06/19/21 0805  . pantoprazole (PROTONIX) EC tablet 40 mg  40 mg Oral Daily [name], [name], MD   40 mg at 06/19/21 0759  . QUEtiapine (SEROQUEL XR) 24 hr tablet 400 mg  400 mg Oral QHS [name], [name], MD   400 mg at 06/18/21 2138  . zolpidem (AMBIEN) tablet 10 mg  10 mg Oral QHS [name], MD   10 mg at 06/18/21 2138    Lab Results:  No results found for this or any previous visit (from the past 48 hour(s)).   Blood Alcohol level:  Lab Results  Component Value Date   ETH <10 06/06/2021   ETH <10 16/09/9603    Metabolic Disorder Labs: Lab Results  Component Value Date   HGBA1C 7.4 (H) 06/08/2021   MPG 166 06/08/2021   MPG 165.68 01/04/2021   No results found for: PROLACTIN Lab Results  Component Value Date   CHOL 133 06/08/2021   TRIG 225 (H) 06/08/2021   HDL 24 (L) 06/08/2021   CHOLHDL 5.5 06/08/2021   VLDL 45 (H) 06/08/2021   LDLCALC 64 06/08/2021   LDLCALC 80  08/26/2019    Physical Findings: AIMS:  , ,  ,  ,    CIWA:    COWS:  Musculoskeletal: Strength & Muscle Tone: within normal limits Gait & Station: normal Patient leans: N/A  Psychiatric Specialty Exam: Physical Exam Vitals and nursing note reviewed.    Review of Systems  Blood pressure 111/84, pulse 88, temperature (!) 97.5 F (36.4 C), temperature source Oral, resp. rate 18, height 6' (1.829 m), weight 131.1 kg, SpO2 94 %.Body mass index is 39.2 kg/m.  General Appearance: Casual  Eye Contact:  Good  Speech:  Clear and Coherent and Normal Rate  Volume:  Normal  Mood:  Depressed  Affect:  Congruent and Constricted  Thought Process:  Coherent, Goal Directed and Linear  Orientation:  Full (Time, Place, and Person)  Thought Content:  Logical  Suicidal Thoughts:  Yes, passive SI  Homicidal Thoughts:  No  Memory:  Immediate;   Fair Recent;   Fair Remote;   Fair  Judgement:  Fair  Insight:  Fair  Psychomotor Activity:  Normal  Concentration:  Concentration: Fair and Attention Span: Fair  Recall:  [employer] of Knowledge:  Fair  Language:  Fair  Akathisia:  No  Handed:  Right  AIMS (if indicated):     Assets:  Communication Skills Desire for Improvement Resilience Social Support  ADL's:  Intact  Cognition:  WNL  Sleep:  Number of Hours: 8     Treatment Plan Summary: Daily contact with patient to assess and evaluate symptoms and progress in treatment and Medication management    Patient is a 60 year old male with the above-stated past psychiatric and medical history who was admitted to [facility] unit secondary to worsened depression and suicidal ideations. Today continues to reports feeling depressed and anxious due to unstable housing situation; denies active suicidal thoughts, plans. No change to medications.   -  I certify that the patient does need, on a daily basis, active treatment furnished directly by or requiring the supervision of inpatient psychiatric facility  personnel.    06/19/21: Psychiatric exam above reviewed and remains accurate. Assessment and plan above reviewed and updated.       [name], MD 06/19/2021, 2:13 PM Physical Exam Vitals and nursing note reviewed.   ROS

## 2021-06-19 NOTE — Progress Notes (Signed)
D: Pt alert and oriented. Pt rates depression 4/10, hopelessness 4/10, and anxiety 4/10. Pt reports energy level as normal and concentration as being good. Pt reports sleep last night as being good. Pt did receive medications for sleep and did find them helpful. Pt reports experiencing 5/10 chronic back pain, pt initially refused pain management medications. Pt denies experiencing any SI/HI, or AVH at this time.   Pt did not attend groups today.  A: Scheduled medications administered to pt, per MD orders. Support and encouragement provided. Frequent verbal contact made. Routine safety checks conducted q15 minutes.   R: No adverse drug reactions noted. Pt verbally contracts for safety at this time. Pt complaint with medications. Pt interacts well with others on the unit. Pt remains safe at this time. Will continue to monitor.

## 2021-06-20 MED ORDER — METFORMIN HCL 1000 MG PO TABS: 1000.0000 mg | ORAL_TABLET | Freq: Two times a day (BID) | ORAL | 1 refills | Status: AC

## 2021-06-20 NOTE — Tx Team (Addendum)
Interdisciplinary Treatment and Diagnostic Plan Update  06/20/2021 Time of Session: 8:30 AM Ian [name] MRN: [id number]  Principal Diagnosis: Severe recurrent major depression without psychotic features [facility])  Secondary Diagnoses: Principal Problem:   Severe recurrent major depression without psychotic features ([place]) Active Problems:   HTN (hypertension)   Dyslipidemia   Generalized anxiety disorder   Type 2 diabetes mellitus with hyperglycemia ([place])   Current Medications:  Current Facility-Administered Medications  Medication Dose Route Frequency Provider Last Rate Last Admin   albuterol (VENTOLIN HFA) 108 (90 Base) MCG/ACT inhaler 2 puff  2 puff Inhalation Q6H PRN [name], [name], MD   2 puff at 06/15/21 1216   alum & mag hydroxide-simeth (MAALOX/MYLANTA) 200-200-20 MG/5ML suspension 30 mL  30 mL Oral Q4H PRN [name], [name], MD       aspirin EC tablet 81 mg  81 mg Oral Daily Selina Cooley M, MD   81 mg at 06/20/21 0758   atorvastatin (LIPITOR) tablet 40 mg  40 mg Oral Daily [name], [name], MD   40 mg at 06/20/21 0758   busPIRone (BUSPAR) tablet 20 mg  20 mg Oral TID [name], [name], MD   20 mg at 06/20/21 0758   clonazePAM (KLONOPIN) tablet 0.5 mg  0.5 mg Oral BID PRN [name], [name], MD   0.5 mg at 06/17/21 1941   escitalopram (LEXAPRO) tablet 20 mg  20 mg Oral Daily [name], [name], MD   20 mg at 06/20/21 0758   hydrALAZINE (APRESOLINE) tablet 10 mg  10 mg Oral Q6H PRN [name], MD   10 mg at 06/07/21 1404   hydrochlorothiazide (HYDRODIURIL) tablet 25 mg  25 mg Oral Daily [name], [name], MD   25 mg at 06/20/21 0758   hydrOXYzine (ATARAX/VISTARIL) tablet 25 mg  25 mg Oral TID PRN [name], MD   25 mg at 06/17/21 1142   ibuprofen (ADVIL) tablet 600 mg  600 mg Oral Q6H PRN Larita Fife, MD   600 mg at 06/19/21 2119   lisinopril (ZESTRIL) tablet 20 mg  20 mg Oral Daily [name], MD   20 mg at 06/20/21 9211   magnesium hydroxide (MILK OF MAGNESIA)  suspension 30 mL  30 mL Oral Daily PRN [name], [name], MD       metFORMIN (GLUCOPHAGE) tablet 1,000 mg  1,000 mg Oral BID WC [name], [name], MD   1,000 mg at 06/20/21 0758   mometasone-formoterol (DULERA) 200-5 MCG/ACT inhaler 2 puff  2 puff Inhalation BID [name], [name], MD   2 puff at 06/20/21 0758   nicotine (NICODERM CQ - dosed in mg/24 hours) patch 21 mg  21 mg Transdermal Daily [name], [name], MD   21 mg at 06/19/21 0805   pantoprazole (PROTONIX) EC tablet 40 mg  40 mg Oral Daily [name], [name], MD   40 mg at 06/20/21 0758   QUEtiapine (SEROQUEL XR) 24 hr tablet 400 mg  400 mg Oral QHS [name], [name], MD   400 mg at 06/19/21 2119   zolpidem (AMBIEN) tablet 10 mg  10 mg Oral QHS [name], MD   10 mg at 06/19/21 2119   PTA Medications: Medications Prior to Admission  Medication Sig Dispense Refill Last Dose   ADVAIR DISKUS 500-50 MCG/DOSE AEPB Inhale 1 puff into the lungs 2 (two) times daily.      albuterol (VENTOLIN HFA) 108 (90 Base) MCG/ACT inhaler Inhale 2 puffs into the lungs  every 6 (six) hours as needed for wheezing or shortness of breath. 18 g 1    amLODipine (NORVASC) 5 MG tablet Take 1 tablet (5 mg total) by mouth daily. 30 tablet 1    aspirin 81 MG chewable tablet Chew 1 tablet (81 mg total) by mouth daily. (Patient not taking: Reported on 06/06/2021) 30 tablet 1    atorvastatin (LIPITOR) 40 MG tablet Take 1 tablet (40 mg total) by mouth daily. 30 tablet 1    hydrochlorothiazide (HYDRODIURIL) 25 MG tablet Take 1 tablet (25 mg total) by mouth daily. 30 tablet 1    lisinopril (ZESTRIL) 40 MG tablet Take 1 tablet (40 mg total) by mouth daily. 30 tablet 1    metFORMIN (FORTAMET) 1000 MG (OSM) 24 hr tablet Take 1 tablet (1,000 mg total) by mouth 2 (two) times daily with a meal. (Patient not taking: Reported on 06/06/2021) 60 tablet 1    mometasone-formoterol (DULERA) 200-5 MCG/ACT AERO Inhale 2 puffs into the lungs 2 (two) times daily. (Patient not taking: Reported on  06/06/2021) 1 each 1    [DISCONTINUED] busPIRone (BUSPAR) 10 MG tablet Take 2 tablets (20 mg total) by mouth 3 (three) times daily. 180 tablet 1    [DISCONTINUED] clonazePAM (KLONOPIN) 0.5 MG tablet Take 1 tablet (0.5 mg total) by mouth 2 (two) times daily as needed (Anxiety). 60 tablet 1    [DISCONTINUED] escitalopram (LEXAPRO) 20 MG tablet Take 1 tablet (20 mg total) by mouth daily. 30 tablet 1    [DISCONTINUED] hydrOXYzine (ATARAX/VISTARIL) 25 MG tablet Take 1 tablet (25 mg total) by mouth 3 (three) times daily as needed for anxiety. (Patient not taking: Reported on 06/06/2021) 90 tablet 1    [DISCONTINUED] metFORMIN (GLUCOPHAGE-XR) 500 MG 24 hr tablet Take 1,000 mg by mouth 2 (two) times daily.      [DISCONTINUED] pantoprazole (PROTONIX) 40 MG tablet Take 1 tablet (40 mg total) by mouth daily. 30 tablet 1    [DISCONTINUED] QUEtiapine (SEROQUEL XR) 400 MG 24 hr tablet Take 1 tablet (400 mg total) by mouth at bedtime. 30 tablet 1    [DISCONTINUED] zolpidem (AMBIEN) 10 MG tablet Take 1 tablet (10 mg total) by mouth at bedtime as needed for sleep. 30 tablet 1     Patient Stressors: Financial difficulties Other: living situation  Patient Strengths: Agricultural engineer for treatment/growth  Treatment Modalities: Medication Management, Group therapy, Case management,  1 to 1 session with clinician, Psychoeducation, Recreational therapy.   Physician Treatment Plan for Primary Diagnosis: Severe recurrent major depression without psychotic features ([place]) Long Term Goal(s): Improvement in symptoms so as ready for discharge   Short Term Goals: Ability to identify changes in lifestyle to reduce recurrence of condition will improve Ability to verbalize feelings will improve Ability to disclose and discuss suicidal ideas Ability to demonstrate self-control will improve Ability to identify and develop effective coping behaviors will improve Ability to maintain clinical measurements within  normal limits will improve Compliance with prescribed medications will improve Ability to identify triggers associated with substance abuse/mental health issues will improve  Medication Management: Evaluate patient's response, side effects, and tolerance of medication regimen.  Therapeutic Interventions: 1 to 1 sessions, Unit Group sessions and Medication administration.  Evaluation of Outcomes: Progressing  Physician Treatment Plan for Secondary Diagnosis: Principal Problem:   Severe recurrent major depression without psychotic features ([place]) Active Problems:   HTN (hypertension)   Dyslipidemia   Generalized anxiety disorder   Type 2 diabetes mellitus with hyperglycemia ([place])  Long  Term Goal(s): Improvement in symptoms so as ready for discharge   Short Term Goals: Ability to identify changes in lifestyle to reduce recurrence of condition will improve Ability to verbalize feelings will improve Ability to disclose and discuss suicidal ideas Ability to demonstrate self-control will improve Ability to identify and develop effective coping behaviors will improve Ability to maintain clinical measurements within normal limits will improve Compliance with prescribed medications will improve Ability to identify triggers associated with substance abuse/mental health issues will improve     Medication Management: Evaluate patient's response, side effects, and tolerance of medication regimen.  Therapeutic Interventions: 1 to 1 sessions, Unit Group sessions and Medication administration.  Evaluation of Outcomes: Progressing   RN Treatment Plan for Primary Diagnosis: Severe recurrent major depression without psychotic features ([place]) Long Term Goal(s): Knowledge of disease and therapeutic regimen to maintain health will improve  Short Term Goals: Ability to remain free from injury will improve, Ability to verbalize frustration and anger appropriately will improve, Ability to verbalize  feelings will improve, Ability to disclose and discuss suicidal ideas, Ability to identify and develop effective coping behaviors will improve, and Compliance with prescribed medications will improve  Medication Management: RN will administer medications as ordered by provider, will assess and evaluate patient's response and provide education to patient for prescribed medication. RN will report any adverse and/or side effects to prescribing provider.  Therapeutic Interventions: 1 on 1 counseling sessions, Psychoeducation, Medication administration, Evaluate responses to treatment, Monitor vital signs and CBGs as ordered, Perform/monitor CIWA, COWS, AIMS and Fall Risk screenings as ordered, Perform wound care treatments as ordered.  Evaluation of Outcomes: Progressing   LCSW Treatment Plan for Primary Diagnosis: Severe recurrent major depression without psychotic features ([place]) Long Term Goal(s): Safe transition to appropriate next level of care at discharge, Engage patient in therapeutic group addressing interpersonal concerns.  Short Term Goals: Engage patient in aftercare planning with referrals and resources, Increase social support, Increase emotional regulation, Identify triggers associated with mental health/substance abuse issues, and Increase skills for wellness and recovery  Therapeutic Interventions: Assess for all discharge needs, 1 to 1 time with Social worker, Explore available resources and support systems, Assess for adequacy in community support network, Educate family and significant other(s) on suicide prevention, Complete Psychosocial Assessment, Interpersonal group therapy.  Evaluation of Outcomes: Progressing   Progress in Treatment: Attending groups: No. Participating in groups: No. Taking medication as prescribed: Yes. Toleration medication: Yes. Family/Significant other contact made: Yes, individual(s) contacted:  Ian [name], peer support specialist Patient  understands diagnosis: Yes. Discussing patient identified problems/goals with staff: Yes. Medical problems stabilized or resolved: Yes. Denies suicidal/homicidal ideation: Yes. Issues/concerns per patient self-inventory: No. Other: None.  New problem(s) identified: No, Describe:  none. Update 06/15/2021: No changes at this time.   New Short Term/Long Term Goal(s): medication management for mood stabilization; elimination of SI thoughts; development of comprehensive mental wellness plan. Update 06/15/2021: No changes at this time.  Update 06/20/21: No changes at this time.   Patient Goals: "A place stay on [address]."  Update 06/15/2021: No changes at this time.  Update 06/20/21: No changes at this time.   Discharge Plan or Barriers: CSW will assist with development of an appropriate aftercare/discharge plan.  Update 06/15/2021: CSW to place request for charitable foundations to assist with home utility repair. Update 06/20/21: Pt to discharge tomorrow with his peer support specialist back to his home.   Reason for Continuation of Hospitalization: Anxiety Depression Medication stabilization Suicidal ideation  Estimated  Length of Stay: TBD  Recreational Therapy: Patient Stressors: N/A Patient Goal: Patient will engage in groups without prompting or encouragement from LRT x3 group sessions within 5 recreation therapy group sessions  Attendees: Patient:  06/20/2021 9:26 AM  Physician: Selina Cooley, MD 06/20/2021 9:26 AM  Nursing: 06/20/2021 9:26 AM  RN Care Manager: 06/20/2021 9:26 AM  Social Worker: Paulla Dolly, MSW, [place], [place]  06/20/2021 9:26 AM  Recreational Therapist:  06/20/2021 9:26 AM  Other: Assunta Curtis, MSW, LCSW 06/20/2021 9:26 AM  Other: Chalmers Guest. Guerry Bruin, MSW, [place], [place] 06/20/2021 9:26 AM  Other: 06/20/2021 9:26 AM    Scribe for Treatment Team: Shirl Harris, LCSW 06/20/2021 9:26 AM

## 2021-06-20 NOTE — [facility] Group Notes (Signed)
LCSW Group Therapy Note  06/20/2021 12:30 PM  Type of Therapy/Topic:  Group Therapy:  Balance in Life  Participation Level:  Did Not Attend  Description of Group:    This group will address the concept of balance and how it feels and looks when one is unbalanced. Patients will be encouraged to process areas in their lives that are out of balance and identify reasons for remaining unbalanced. Facilitators will guide patients in utilizing problem-solving interventions to address and correct the stressor making their life unbalanced. Understanding and applying boundaries will be explored and addressed for obtaining and maintaining a balanced life. Patients will be encouraged to explore ways to assertively make their unbalanced needs known to significant others in their lives, using other group members and facilitator for support and feedback.  Therapeutic Goals: Patient will identify two or more emotions or situations they have that consume much of in their lives. Patient will identify signs/triggers that life has become out of balance:  Patient will identify two ways to set boundaries in order to achieve balance in their lives:  Patient will demonstrate ability to communicate their needs through discussion and/or role plays  Summary of Patient Progress: Patient given the opportunity to attend group, however, was asleep.     Therapeutic Modalities:   Cognitive Behavioral Therapy Solution-Focused Therapy Assertiveness Training  Assunta Curtis MSW, LCSW 06/20/2021 12:30 PM

## 2021-06-20 NOTE — Progress Notes (Signed)
Patient is calm and cooperative with assessment. He denies SI, HI, and AVH. Patient says depression is better. He is anxious about discharge tomorrow. Patient is observed to be interacting appropriately with staff and other patients on the unit. He is medication compliant. Support and encouragement provided. Patient remains safe on the unit at this time.

## 2021-06-20 NOTE — Progress Notes (Signed)
Recreation Therapy Notes  Date: 06/20/2021  Time: 10:00 am   Location: Courtyard  Behavioral response: N/A   Intervention Topic: Leisure   Discussion/Intervention: Patient did not attend group.   Clinical Observations/Feedback:  Patient did not attend group.   Aniaya Bacha LRT/CTRS        Etheridge Geil 06/20/2021 11:43 AM        Dyshon Philbin 06/20/2021 11:43 AM

## 2021-06-20 NOTE — Progress Notes (Signed)
[facility] MD Progress Note  06/20/2021 10:53 AM Ian [name]  MRN:  [id number]  Principal Problem: Severe recurrent major depression without psychotic features [facility]) Diagnosis: Principal Problem:   Severe recurrent major depression without psychotic features ([place]) Active Problems:   HTN (hypertension)   Dyslipidemia   Generalized anxiety disorder   Type 2 diabetes mellitus with hyperglycemia ([place])  Total Time spent with patient: 15 minutes  CC "I'm anxious"  Ian [name] is a  59y.o. male that has a previous psychiatric history of depression who presents to the [facility] unit for treatment of depression and suicidal ideations.  Interval History Patient was seen today for re-evaluation.  Nursing reports no events overnight. The patient has no issues with performing ADLs.  Patient has been medication compliant.    Subjective: Patient seen one-on-one again today.  He feels that his depression is improving and he is no longer having suicidal ideations.  He remains very anxious about his living situation and future plans.  He denies any homicidal ideations, visual hallucinations, auditory hallucinations.  He plans to discharge tomorrow and go with his peers support specialist to apply for more section 8 housing and acquire an [facility] unit for his trailer.  Past Psychiatric History: see H&P  Past Medical History:  Past Medical History:  Diagnosis Date  . Anxiety   . Barrett esophagus   . Cancer ([place])   . COPD (chronic obstructive pulmonary disease) ([place])   . Coronary artery disease   . Depression   . GERD (gastroesophageal reflux disease)   . Hypertension   . Pre-diabetes   . Stroke [facility]) 2014   "mini-stroke" per patient    Past Surgical History:  Procedure Laterality Date  . ANGIOPLASTY    . APPENDECTOMY    . CARDIAC CATHETERIZATION    . CHOLECYSTECTOMY    . WRIST SURGERY Right    age 64   Family History:  Family History  Problem Relation Age of Onset  . Stroke Father   . Leukemia  Father   . Breast cancer Sister    Family Psychiatric  History: see H&P Social History:  Social History   Substance and Sexual Activity  Alcohol Use Not Currently     Social History   Substance and Sexual Activity  Drug Use Yes  . Types: Cocaine, Marijuana    Social History   Socioeconomic History  . Marital status: Divorced    Spouse name: Not on file  . Number of children: Not on file  . Years of education: Not on file  . Highest education level: Not on file  Occupational History  . Not on file  Tobacco Use  . Smoking status: Every Day    Packs/day: 1.50    Pack years: 0.00    Types: Cigarettes  . Smokeless tobacco: Never  Vaping Use  . Vaping Use: Never used  Substance and Sexual Activity  . Alcohol use: Not Currently  . Drug use: Yes    Types: Cocaine, Marijuana  . Sexual activity: Not Currently  Other Topics Concern  . Not on file  Social History Narrative  . Not on file   Social Determinants of Health   Financial Resource Strain: Not on file  Food Insecurity: Not on file  Transportation Needs: Not on file  Physical Activity: Not on file  Stress: Not on file  Social Connections: Not on file   Additional Social History:  Sleep: Good  Appetite:  Good  Current Medications: Current Facility-Administered Medications  Medication Dose Route Frequency Provider Last Rate Last Admin  . albuterol (VENTOLIN HFA) 108 (90 Base) MCG/ACT inhaler 2 puff  2 puff Inhalation Q6H PRN [name], [name], MD   2 puff at 06/15/21 1216  . alum & mag hydroxide-simeth (MAALOX/MYLANTA) 200-200-20 MG/5ML suspension 30 mL  30 mL Oral Q4H PRN [name], [name], MD      . aspirin EC tablet 81 mg  81 mg Oral Daily [name], MD   81 mg at 06/20/21 0758  . atorvastatin (LIPITOR) tablet 40 mg  40 mg Oral Daily [name], [name], MD   40 mg at 06/20/21 0758  . busPIRone (BUSPAR) tablet 20 mg  20 mg Oral TID [name], [name], MD   20 mg at 06/20/21  0758  . clonazePAM (KLONOPIN) tablet 0.5 mg  0.5 mg Oral BID PRN [name], [name], MD   0.5 mg at 06/17/21 1941  . escitalopram (LEXAPRO) tablet 20 mg  20 mg Oral Daily [name], [name], MD   20 mg at 06/20/21 0758  . hydrALAZINE (APRESOLINE) tablet 10 mg  10 mg Oral Q6H PRN [name], MD   10 mg at 06/07/21 1404  . hydrochlorothiazide (HYDRODIURIL) tablet 25 mg  25 mg Oral Daily [name], [name], MD   25 mg at 06/20/21 0758  . hydrOXYzine (ATARAX/VISTARIL) tablet 25 mg  25 mg Oral TID PRN [name], MD   25 mg at 06/17/21 1142  . ibuprofen (ADVIL) tablet 600 mg  600 mg Oral Q6H PRN Larita Fife, MD   600 mg at 06/19/21 2119  . lisinopril (ZESTRIL) tablet 20 mg  20 mg Oral Daily [name], MD   20 mg at 06/20/21 0758  . magnesium hydroxide (MILK OF MAGNESIA) suspension 30 mL  30 mL Oral Daily PRN [name], [name], MD      . metFORMIN (GLUCOPHAGE) tablet 1,000 mg  1,000 mg Oral BID WC [name], [name], MD   1,000 mg at 06/20/21 0758  . mometasone-formoterol (DULERA) 200-5 MCG/ACT inhaler 2 puff  2 puff Inhalation BID [name], [name], MD   2 puff at 06/20/21 0758  . nicotine (NICODERM CQ - dosed in mg/24 hours) patch 21 mg  21 mg Transdermal Daily [name], [name], MD   21 mg at 06/19/21 0805  . pantoprazole (PROTONIX) EC tablet 40 mg  40 mg Oral Daily [name], [name], MD   40 mg at 06/20/21 0758  . QUEtiapine (SEROQUEL XR) 24 hr tablet 400 mg  400 mg Oral QHS [name], [name], MD   400 mg at 06/19/21 2119  . zolpidem (AMBIEN) tablet 10 mg  10 mg Oral QHS [name], MD   10 mg at 06/19/21 2119    Lab Results:  No results found for this or any previous visit (from the past 48 hour(s)).   Blood Alcohol level:  Lab Results  Component Value Date   ETH <10 06/06/2021   ETH <10 82/95/6213    Metabolic Disorder Labs: Lab Results  Component Value Date   HGBA1C 7.4 (H) 06/08/2021   MPG 166 06/08/2021   MPG 165.68 01/04/2021   No results found for: PROLACTIN Lab Results   Component Value Date   CHOL 133 06/08/2021   TRIG 225 (H) 06/08/2021   HDL 24 (L) 06/08/2021   CHOLHDL 5.5 06/08/2021   VLDL 45 (H) 06/08/2021  [place] 64 06/08/2021   [place] 80 08/26/2019    Physical Findings: AIMS:  , ,  ,  ,    CIWA:    COWS:     Musculoskeletal: Strength & Muscle Tone: within normal limits Gait & Station: normal Patient leans: N/A  Psychiatric Specialty Exam: Physical Exam Vitals and nursing note reviewed.    Review of Systems  Blood pressure 120/77, pulse 85, temperature (!) 97.5 F (36.4 C), temperature source Oral, resp. rate 18, height 6' (1.829 m), weight 131.1 kg, SpO2 93 %.Body mass index is 39.2 kg/m.  General Appearance: Casual  Eye Contact:  Good  Speech:  Clear and Coherent and Normal Rate  Volume:  Normal  Mood:  Depressed  Affect:  Congruent and Constricted  Thought Process:  Coherent, Goal Directed and Linear  Orientation:  Full (Time, Place, and Person)  Thought Content:  Logical  Suicidal Thoughts:  Denies  Homicidal Thoughts:  No  Memory:  Immediate;   Fair Recent;   Fair Remote;   Fair  Judgement:  Fair  Insight:  Fair  Psychomotor Activity:  Normal  Concentration:  Concentration: Fair and Attention Span: Fair  Recall:  [employer] of Knowledge:  Fair  Language:  Fair  Akathisia:  No  Handed:  Right  AIMS (if indicated):     Assets:  Communication Skills Desire for Improvement Resilience Social Support  ADL's:  Intact  Cognition:  WNL  Sleep:  Number of Hours: 8     Treatment Plan Summary: Daily contact with patient to assess and evaluate symptoms and progress in treatment and Medication management    Patient is a 60 year old male with the above-stated past psychiatric and medical history who was admitted to [facility] unit secondary to worsened depression and suicidal ideations. Today continues to reports feeling danxious due to unstable housing situation; denies active suicidal thoughts, plans. No change to  medications. Anticipate dc tomorrow.     06/20/21: Psychiatric exam above reviewed and remains accurate. Assessment and plan above reviewed and updated.       [name], MD 06/20/2021, 10:53 AM Physical Exam Vitals and nursing note reviewed.   ROS

## 2021-06-20 NOTE — Progress Notes (Signed)
Patient is pleasant. Has complaint of back pain this evening that he rates at 7/10.  Received Ibuprofen to help with symptoms.  Denies SI/HI/AVH does continue to endorse depression and anxiety. He is med compliant and received medication without incident.  He is active on the unit and engages well with the other patients in the dayroom. Will continue to monitor with Q 15 minute safety checks.    Cleo Butler-Nicholson, LPN

## 2021-06-20 NOTE — Plan of Care (Signed)
  Problem: Education: Goal: Knowledge of [facility] Education information/materials will improve Outcome: Progressing Goal: Emotional status will improve Outcome: Progressing Goal: Mental status will improve Outcome: Progressing Goal: Verbalization of understanding the information provided will improve Outcome: Progressing   Problem: Activity: Goal: Interest or engagement in activities will improve Outcome: Progressing Goal: Sleeping patterns will improve Outcome: Progressing   Problem: Coping: Goal: Ability to verbalize frustrations and anger appropriately will improve Outcome: Progressing Goal: Ability to demonstrate self-control will improve Outcome: Progressing   Problem: Health Behavior/Discharge Planning: Goal: Identification of resources available to assist in meeting health care needs will improve Outcome: Progressing Goal: Compliance with treatment plan for underlying cause of condition will improve Outcome: Progressing   Problem: Physical Regulation: Goal: Ability to maintain clinical measurements within normal limits will improve Outcome: Progressing   Problem: Education: Goal: Utilization of techniques to improve thought processes will improve Outcome: Progressing Goal: Knowledge of the prescribed therapeutic regimen will improve Outcome: Progressing

## 2021-06-21 NOTE — Progress Notes (Signed)
  [facility] Adult Case Management Discharge Plan :  Will you be returning to the same living situation after discharge:  Yes,  pt to return home. At discharge, do you have transportation home?: Yes,  peer support specialist to provide transportation home. Do you have the ability to pay for your medications: Yes,  [place] of information consent forms completed and in the chart;  Patient's signature needed at discharge.  Patient to Follow up at:  Follow-up Information     [facility] on 06/27/2021.   Why: You have a hospital follow up appointment on Thursday, July 7th at 1:00pm. This is an in-person appointment. Thanks! Contact information: [place] [address] [phone]                 Next level of care provider has access to [place] and Suicide Prevention discussed: Yes,  SPE completed with peer support, Noland Fordyce.     Has patient been referred to the Quitline?: Patient refused referral  Patient has been referred for addiction treatment: Pt. refused referral  Shirl Harris, LCSW 06/21/2021, 9:56 AM

## 2021-06-21 NOTE — BHH Suicide Risk Assessment (Signed)
[facility] Discharge Suicide Risk Assessment   Principal Problem: Severe recurrent major depression without psychotic features [facility]) Discharge Diagnoses: Principal Problem:   Severe recurrent major depression without psychotic features ([place]) Active Problems:   HTN (hypertension)   Dyslipidemia   Generalized anxiety disorder   Type 2 diabetes mellitus with hyperglycemia ([place])   Total Time spent with patient: 35 minutes- 25 minutes face-to-face contact with patient, 10 minutes documentation, coordination of care, scripts   Musculoskeletal: Strength & Muscle Tone: within normal limits Gait & Station: normal Patient leans: N/A  Psychiatric Specialty Exam  Presentation  General Appearance: Casual; Appropriate for Environment  Eye Contact:Good  Speech:Normal Rate  Speech Volume:Normal  Handedness:Right   Mood and Affect  Mood:Euthymic  Duration of Depression Symptoms: Greater than two weeks  Affect:Congruent   Thought Process  Thought Processes:Coherent; Goal Directed  Descriptions of Associations:Intact  Orientation:Full (Time, Place and Person)  Thought Content:Logical  History of Schizophrenia/Schizoaffective disorder:No data recorded Duration of Psychotic Symptoms:No data recorded Hallucinations:Hallucinations: None  Ideas of Reference:None  Suicidal Thoughts:Suicidal Thoughts: No  Homicidal Thoughts:Homicidal Thoughts: No   Sensorium  Memory:Immediate Good; Recent Good; Remote Good  Judgment:Intact  Insight:Fair   Executive Functions  Concentration:Good  Attention Span:Good  [address] of Knowledge:Good  Language:Good   Psychomotor Activity  Psychomotor Activity:Psychomotor Activity: Normal   Assets  Assets:Communication Skills; Desire for Improvement; Financial Resources/Insurance; Housing; Leisure Time; Social Support   Sleep  Sleep:Sleep: Good Number of Hours of Sleep: 7.45   Physical Exam: Physical Exam ROS Blood  pressure (!) 120/91, pulse 81, temperature 97.7 F (36.5 C), temperature source Oral, resp. rate 18, height 6' (1.829 m), weight 131.1 kg, SpO2 94 %. Body mass index is 39.2 kg/m.  Mental Status Per Nursing Assessment::   On Admission:  Self-harm thoughts  Demographic Factors:  Male and Living alone  Loss Factors: NA  Historical Factors: Prior suicide attempts  Risk Reduction Factors:   Positive social support, Positive therapeutic relationship, and Positive coping skills or problem solving skills  Continued Clinical Symptoms:  Severe Anxiety and/or Agitation Depression:   Recent sense of peace/wellbeing Previous Psychiatric Diagnoses and Treatments  Cognitive Features That Contribute To Risk:  None    Suicide Risk:  Minimal: No identifiable suicidal ideation.  Patients presenting with no risk factors but with morbid ruminations; may be classified as minimal risk based on the severity of the depressive symptoms    Plan Of Care/Follow-up recommendations:  Activity:  as tolerated Diet:  carb modified diet  [name], MD 06/21/2021, 9:38 AM

## 2021-06-21 NOTE — Progress Notes (Signed)
Patient denies SI, HI, and AVH. She was educated on prescriptions and follow up care. Patient questions were answered and pt verbalized understanding and did not voice any concerns. Patient's belongings were returned. Patient was not observed to be in any distress at time of discharge. Patient was safely discharged to the [facility].

## 2021-06-21 NOTE — Progress Notes (Signed)
Recreation Therapy Notes  INPATIENT RECREATION TR PLAN  Patient Details Name: Ian [name] MRN: [id number] DOB: February 11, 1961 Today's Date: 06/21/2021  Rec Therapy Plan Is patient appropriate for Therapeutic Recreation?: Yes Treatment times per week: at least 3 Estimated Length of Stay: 5-7 days TR Treatment/Interventions: Group participation (Comment)  Discharge Criteria Pt will be discharged from therapy if:: Discharged Treatment plan/goals/alternatives discussed and agreed upon by:: Patient/family  Discharge Summary Short term goals set: Patient will engage in groups without prompting or encouragement from LRT x3 group sessions within 5 recreation therapy group sessions Short term goals met: Not met Reason goals not met: Patient did not any groups Therapeutic equipment acquired: N/A Reason patient discharged from therapy: Discharge from hospital Pt/family agrees with progress & goals achieved: Yes Date patient discharged from therapy: 06/21/21   Lexi Conaty 06/21/2021, 12:55 PM

## 2021-06-21 NOTE — Progress Notes (Signed)
Recreation Therapy Notes   Date: 06/21/2021  Time: 9:30 am   Location: Courtyard  Behavioral response: N/A   Intervention Topic: Wellness   Discussion/Intervention: Patient did not attend group.   Clinical Observations/Feedback:  Patient did not attend group.   Momo Braun LRT/CTRS        Skip Litke 06/21/2021 11:07 AM

## 2021-06-21 NOTE — Progress Notes (Signed)
Patient alert and oriented x 4, affect is flat but he brightens upon approach, his thoughts are organized and coherent, he appears elated and ready for discharge tomorrow, he currently denies SI/HI/AVH interacting appropriately with peers and staff, no distress noted, 15 minutes safety checks maintained will continue to monitor.

## 2021-06-21 NOTE — Plan of Care (Signed)
  Problem: Group Participation Goal: STG - Patient will engage in groups without prompting or encouragement from LRT x3 group sessions within 5 recreation therapy group sessions Description: STG - Patient will engage in groups without prompting or encouragement from LRT x3 group sessions within 5 recreation therapy group sessions 06/21/2021 1252 by Ernest Haber, LRT Outcome: Not Applicable 08/22/9801 [id number] by Ernest Haber, LRT Outcome: Not Met (add Reason)

## 2021-06-21 NOTE — Discharge Summary (Signed)
Physician Discharge Summary Note  Patient:  Ian [name] is an 60 y.o., male MRN:  [id number] DOB:  01/15/1961 Patient phone:  [phone] (home)  Patient address:   [address] [place] [address] [place] [address],  Total Time spent with patient: 35 minutes- 25 minutes face-to-face contact with patient, 10 minutes documentation, coordination of care, scripts   Date of Admission:  06/07/2021 Date of Discharge: 06/21/2021  Reason for Admission:  60 year old male with history of depression and anxiety presenting with worsening mood and suicidal ideations  Principal Problem: Severe recurrent major depression without psychotic features ([place]) Discharge Diagnoses: Principal Problem:   Severe recurrent major depression without psychotic features ([place]) Active Problems:   HTN (hypertension)   Dyslipidemia   Generalized anxiety disorder   Type 2 diabetes mellitus with hyperglycemia ([place])   Past Psychiatric History: Long history of both depression and anxiety, multiple inpatient admissions.  He does have a history of alcohol abuse, but has stayed sober for quite some time.  Positive past suicide attempts.  Past Medical History:  Past Medical History:  Diagnosis Date   Anxiety    Barrett esophagus    Cancer (HCC)    COPD (chronic obstructive pulmonary disease) ([place])    Coronary artery disease    Depression    GERD (gastroesophageal reflux disease)    Hypertension    Pre-diabetes    Stroke ([place]) 2014   "mini-stroke" per patient    Past Surgical History:  Procedure Laterality Date   ANGIOPLASTY     APPENDECTOMY     CARDIAC CATHETERIZATION     CHOLECYSTECTOMY     WRIST SURGERY Right    age 83   Family History:  Family History  Problem Relation Age of Onset   Stroke Father    Leukemia Father    Breast cancer Sister    Family Psychiatric  History: Denies Social History:  Social History   Substance and Sexual Activity  Alcohol Use Not Currently     Social History    Substance and Sexual Activity  Drug Use Yes   Types: Cocaine, Marijuana    Social History   Socioeconomic History   Marital status: Divorced    Spouse name: Not on file   Number of children: Not on file   Years of education: Not on file   Highest education level: Not on file  Occupational History   Not on file  Tobacco Use   Smoking status: Every Day    Packs/day: 1.50    Pack years: 0.00    Types: Cigarettes   Smokeless tobacco: Never  Vaping Use   Vaping Use: Never used  Substance and Sexual Activity   Alcohol use: Not Currently   Drug use: Yes    Types: Cocaine, Marijuana   Sexual activity: Not Currently  Other Topics Concern   Not on file  Social History Narrative   Not on file   Social Determinants of Health   Financial Resource Strain: Not on file  Food Insecurity: Not on file  Transportation Needs: Not on file  Physical Activity: Not on file  Stress: Not on file  Social Connections: Not on file    Hospital Course:  Patient admitted for worsening depression and suicidal ideations. All home medications were restarted while here in the hospital, and all were continued unchanged aside from Lisinopril which was decreased to 20 mg daily due to hypotension. While here, mood gradually improved and he no longer has suicidal ideations. He also denies homicidal  ideations, visual hallucinations, and auditory hallucinations. He plans to return home today after purchasing an [facility] unit. He is working with peer support specialist to obtain section 8 housing.   Physical Findings: AIMS: Facial and Oral Movements Muscles of Facial Expression: None, normal Lips and Perioral Area: None, normal Jaw: None, normal Tongue: None, normal,Extremity Movements Upper (arms, wrists, hands, fingers): None, normal Lower (legs, knees, ankles, toes): None, normal, Trunk Movements Neck, shoulders, hips: None, normal, Overall Severity Severity of abnormal movements (highest score from  questions above): None, normal Incapacitation due to abnormal movements: None, normal Patient's awareness of abnormal movements (rate only patient's report): No Awareness, Dental Status Current problems with teeth and/or dentures?: No Does patient usually wear dentures?: No  CIWA:    COWS:     Musculoskeletal: Strength & Muscle Tone: within normal limits Gait & Station: normal Patient leans: N/A   Psychiatric Specialty Exam:  Presentation  General Appearance: Casual; Appropriate for Environment  Eye Contact:Good  Speech:Normal Rate  Speech Volume:Normal  Handedness:Right   Mood and Affect  Mood:Euthymic  Affect:Congruent   Thought Process  Thought Processes:Coherent; Goal Directed  Descriptions of Associations:Intact  Orientation:Full (Time, Place and Person)  Thought Content:Logical  History of Schizophrenia/Schizoaffective disorder:No data recorded Duration of Psychotic Symptoms:No data recorded Hallucinations:Hallucinations: None  Ideas of Reference:None  Suicidal Thoughts:Suicidal Thoughts: No  Homicidal Thoughts:Homicidal Thoughts: No   Sensorium  Memory:Immediate Good; Recent Good; Remote Good  Judgment:Intact  Insight:Fair   Executive Functions  Concentration:Good  Attention Span:Good  [place] of Knowledge:Good  Language:Good   Psychomotor Activity  Psychomotor Activity:Psychomotor Activity: Normal   Assets  Assets:Communication Skills; Desire for Improvement; Financial Resources/Insurance; Housing; Leisure Time; Social Support   Sleep  Sleep:Sleep: Good Number of Hours of Sleep: 7.45    Physical Exam: Physical Exam Vitals and nursing note reviewed.  Constitutional:      Appearance: Normal appearance.  HENT:     Head: Normocephalic and atraumatic.     Right Ear: External ear normal.     Left Ear: External ear normal.     Nose: Nose normal.     Mouth/Throat:     Mouth: Mucous membranes are moist.      Pharynx: Oropharynx is clear.  Eyes:     Extraocular Movements: Extraocular movements intact.     Conjunctiva/sclera: Conjunctivae normal.     Pupils: Pupils are equal, round, and reactive to light.  Cardiovascular:     Rate and Rhythm: Normal rate.     Pulses: Normal pulses.  Pulmonary:     Effort: Pulmonary effort is normal.     Breath sounds: Normal breath sounds.  Abdominal:     General: Abdomen is flat.     Palpations: Abdomen is soft.  Musculoskeletal:        General: No swelling. Normal range of motion.     Cervical back: Normal range of motion and neck supple.  Skin:    General: Skin is warm and dry.  Neurological:     General: No focal deficit present.     Mental Status: He is alert and oriented to person, place, and time.  Psychiatric:        Mood and Affect: Mood normal.        Behavior: Behavior normal.        Thought Content: Thought content normal.        Judgment: Judgment normal.   Review of Systems  Constitutional: Negative.   HENT: Negative.    Eyes: Negative.  Respiratory: Negative.    Cardiovascular: Negative.   Gastrointestinal: Negative.   Genitourinary: Negative.   Musculoskeletal: Negative.   Skin: Negative.   Neurological: Negative.   Endo/Heme/Allergies: Negative.   Psychiatric/Behavioral:  Negative for depression, hallucinations, memory loss, substance abuse and suicidal ideas. The patient is nervous/anxious. The patient does not have insomnia.   Blood pressure (!) 120/91, pulse 81, temperature 97.7 F (36.5 C), temperature source Oral, resp. rate 18, height 6' (1.829 m), weight 131.1 kg, SpO2 94 %. Body mass index is 39.2 kg/m.   Social History   Tobacco Use  Smoking Status Every Day   Packs/day: 1.50   Pack years: 0.00   Types: Cigarettes  Smokeless Tobacco Never   Tobacco Cessation:  A prescription for an FDA-approved tobacco cessation medication was offered at discharge and the patient refused   Blood Alcohol level:  Lab  Results  Component Value Date   [facility] <10 06/06/2021   ETH <10 71/05/2693    Metabolic Disorder Labs:  Lab Results  Component Value Date   HGBA1C 7.4 (H) 06/08/2021   MPG 166 06/08/2021   MPG 165.68 01/04/2021   No results found for: PROLACTIN Lab Results  Component Value Date   CHOL 133 06/08/2021   TRIG 225 (H) 06/08/2021   HDL 24 (L) 06/08/2021   CHOLHDL 5.5 06/08/2021   VLDL 45 (H) 06/08/2021   LDLCALC 64 06/08/2021   [place] 80 08/26/2019    See Psychiatric Specialty Exam and Suicide Risk Assessment completed by Attending Physician prior to discharge.  Discharge destination:  Home  Is patient on multiple antipsychotic therapies at discharge:  No   Has Patient had three or more failed trials of antipsychotic monotherapy by history:  No  Recommended Plan for Multiple Antipsychotic Therapies: NA  Discharge Instructions     Diet Carb Modified   Complete by: As directed    Increase activity slowly   Complete by: As directed       Allergies as of 06/21/2021       Reactions   Fluoxetine Swelling   Anxiety, itching   Hydrocodone-acetaminophen Itching   Mirtazapine Other (See Comments)   nightmares   Tramadol Other (See Comments)   Gabapentin Other (See Comments)   Nightmares        Medication List     STOP taking these medications    Advair Diskus 500-50 MCG/ACT Aepb Generic drug: fluticasone-salmeterol   amLODipine 5 MG tablet Commonly known as: NORVASC   metformin 1000 MG (OSM) 24 hr tablet Commonly known as: FORTAMET Replaced by: metFORMIN 1000 MG tablet   metFORMIN 500 MG 24 hr tablet Commonly known as: GLUCOPHAGE-XR       TAKE these medications      Indication  albuterol 108 (90 Base) MCG/ACT inhaler Commonly known as: VENTOLIN HFA Inhale 2 puffs into the lungs every 6 (six) hours as needed for wheezing or shortness of breath.  Indication: Asthma   atorvastatin 40 MG tablet Commonly known as: LIPITOR Take 1 tablet (40 mg total)  by mouth daily.  Indication: High Amount of Fats in the Blood   busPIRone 10 MG tablet Commonly known as: BUSPAR Take 2 tablets (20 mg total) by mouth 3 (three) times daily.  Indication: Anxiety Disorder   clonazePAM 0.5 MG tablet Commonly known as: KLONOPIN Take 1 tablet (0.5 mg total) by mouth 2 (two) times daily as needed (Anxiety).  Indication: Feeling Anxious   escitalopram 20 MG tablet Commonly known as: LEXAPRO Take 1 tablet (20  mg total) by mouth daily.  Indication: Generalized Anxiety Disorder, Major Depressive Disorder   hydrochlorothiazide 25 MG tablet Commonly known as: HYDRODIURIL Take 1 tablet (25 mg total) by mouth daily.  Indication: High Blood Pressure Disorder   hydrOXYzine 25 MG tablet Commonly known as: ATARAX/VISTARIL Take 1 tablet (25 mg total) by mouth 3 (three) times daily as needed for anxiety.  Indication: Feeling Anxious   lisinopril 20 MG tablet Commonly known as: ZESTRIL Take 1 tablet (20 mg total) by mouth daily. What changed:  medication strength how much to take  Indication: High Blood Pressure Disorder   metFORMIN 1000 MG tablet Commonly known as: GLUCOPHAGE Take 1 tablet (1,000 mg total) by mouth 2 (two) times daily with a meal. Replaces: metformin 1000 MG (OSM) 24 hr tablet  Indication: Type 2 Diabetes   pantoprazole 40 MG tablet Commonly known as: PROTONIX Take 1 tablet (40 mg total) by mouth daily.  Indication: Gastroesophageal Reflux Disease   QUEtiapine 400 MG 24 hr tablet Commonly known as: SEROQUEL XR Take 1 tablet (400 mg total) by mouth at bedtime.  Indication: Major Depressive Disorder   zolpidem 10 MG tablet Commonly known as: AMBIEN Take 1 tablet (10 mg total) by mouth at bedtime as needed for sleep.  Indication: Trouble Sleeping       ASK your doctor about these medications      Indication  aspirin 81 MG chewable tablet Chew 1 tablet (81 mg total) by mouth daily.  Indication: Inflammation, Stable Angina  Pectoris   mometasone-formoterol 200-5 MCG/ACT Aero Commonly known as: DULERA Inhale 2 puffs into the lungs 2 (two) times daily.  Indication: Asthma         Follow-up recommendations:  Activity:  as tolerated Diet:  carb modified diet  Comments:  30-day scripts with 1 refill sent to [facility] in [place], [place] per patient request  Signed: [name], MD 06/21/2021, 9:43 AM

## 2021-07-03 IMAGING — CR DG CHEST 2V
2 series · 2 of 2 positions shown · non-contrast
Comparison: 08/25/2019

CLINICAL DATA: Shortness of breath

EXAM:
CHEST - 2 VIEW

[chest lat]
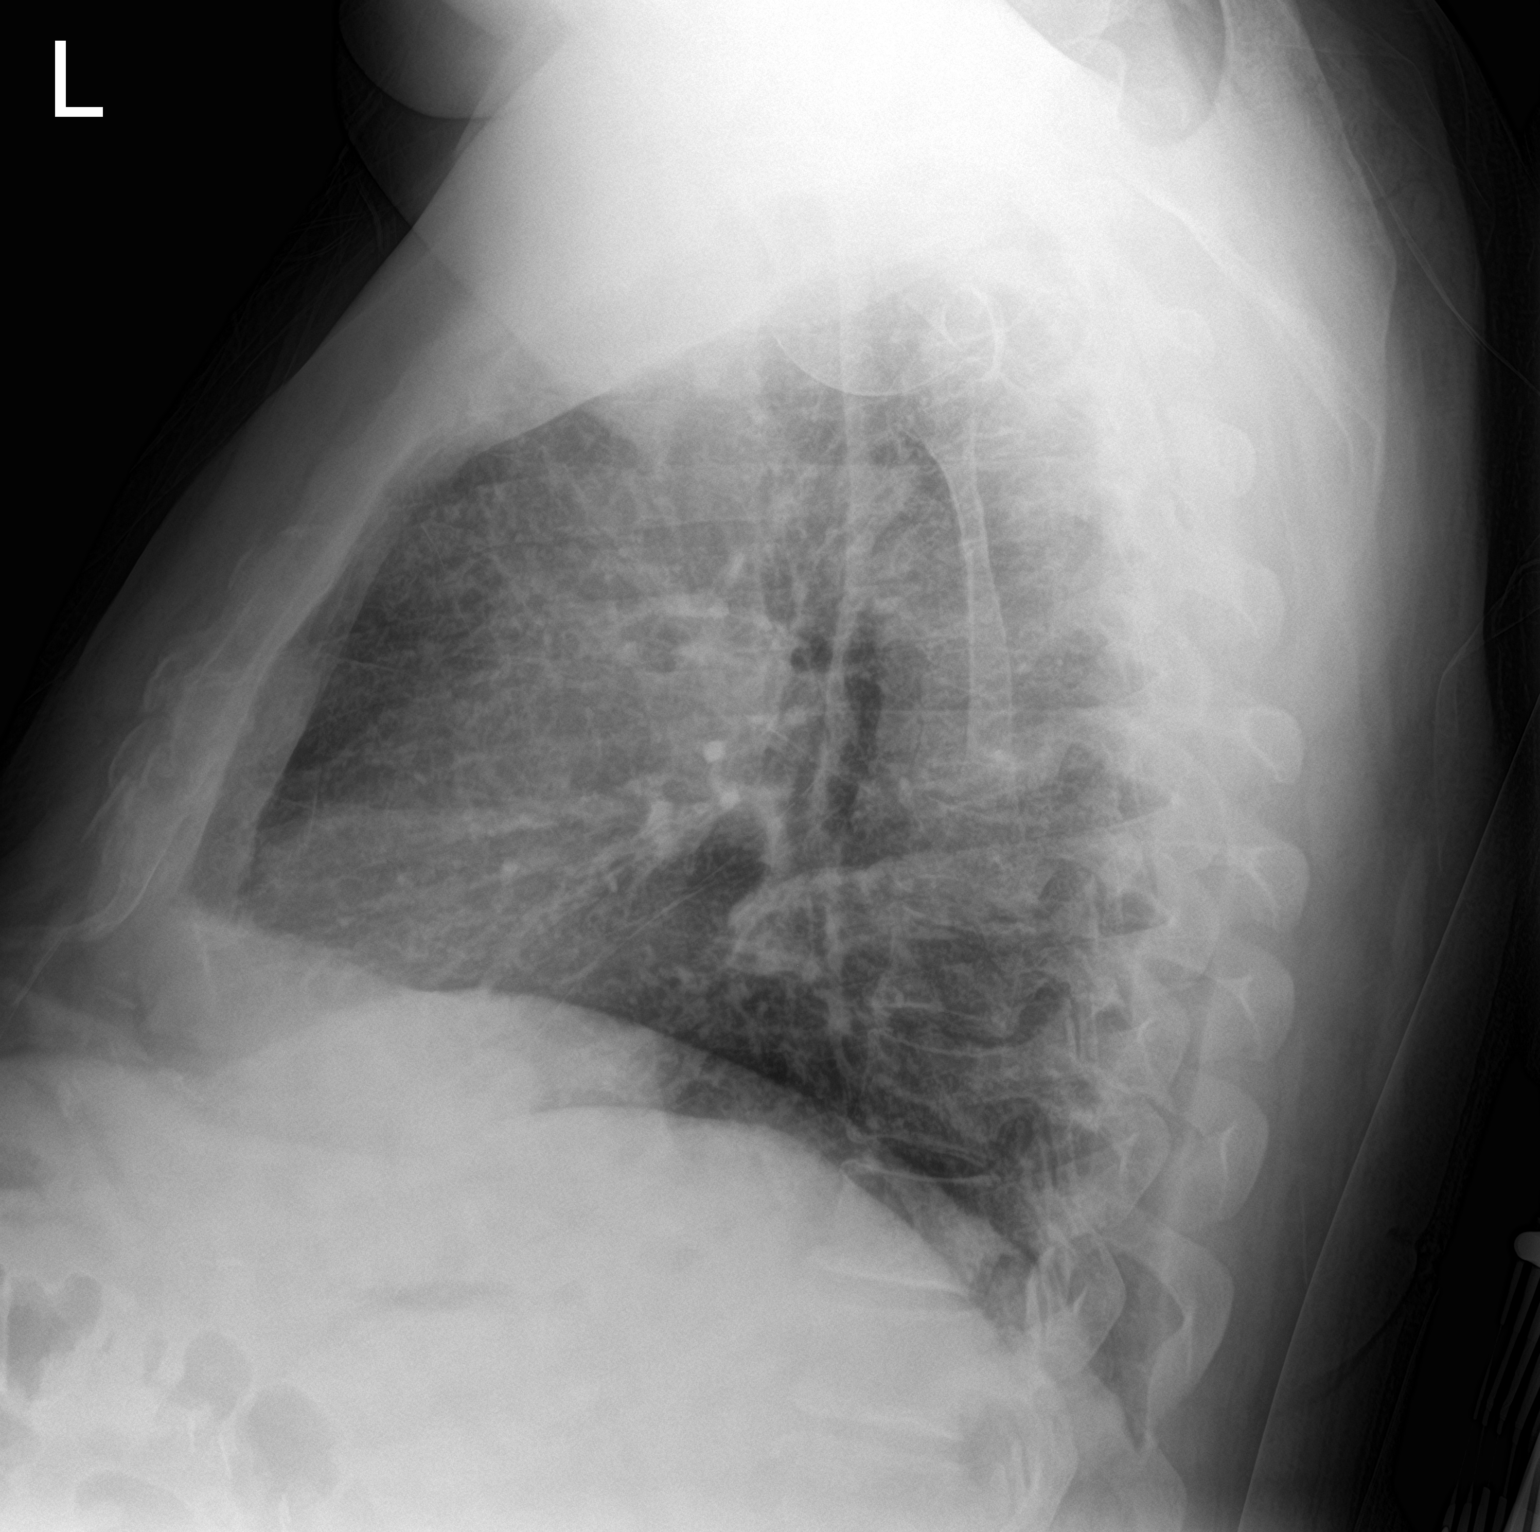

[chest ap]
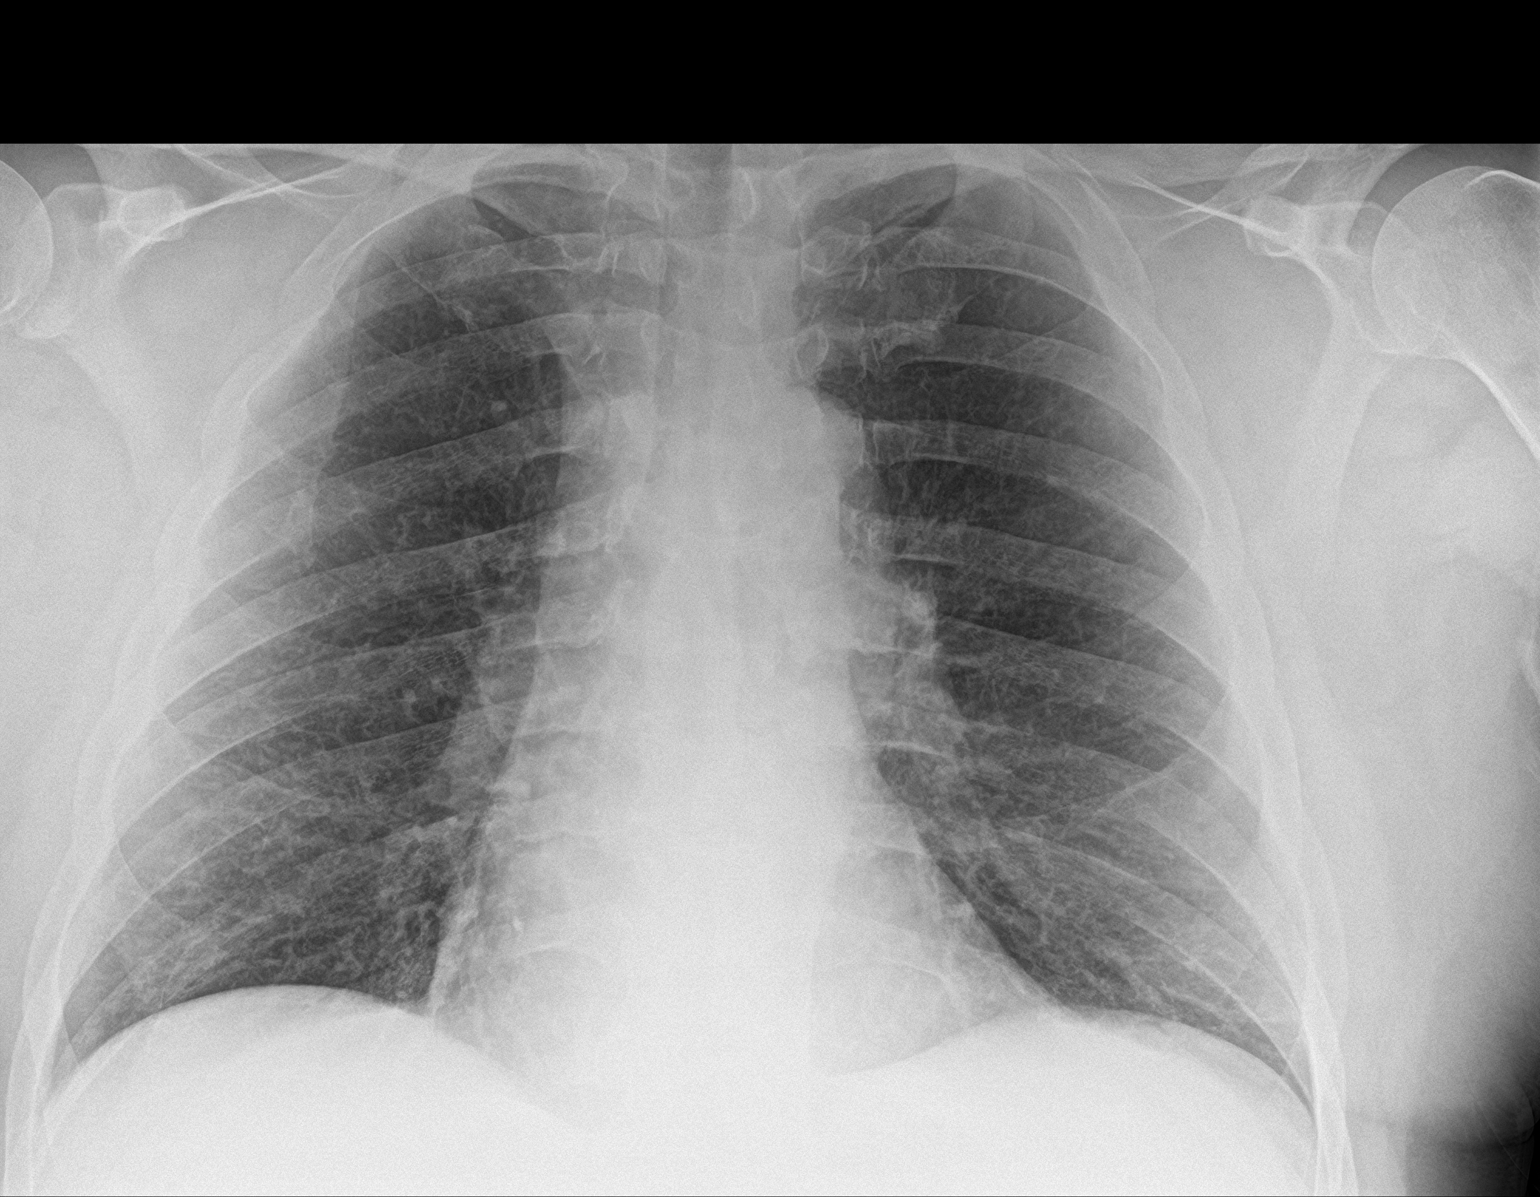

[2 of 2 positions shown; findings below may reference images not displayed]

FINDINGS: Frontal and lateral views of the chest demonstrate an unremarkable
cardiac silhouette. No airspace disease, effusion, or pneumothorax.
No acute bony abnormalities.
IMPRESSION: 1. No acute intrathoracic process.

## 2021-10-30 ENCOUNTER — Ambulatory Visit: Admit: 2021-10-30 | Discharge: 2021-11-03 | Payer: MEDICAID

## 2021-11-01 IMAGING — CT CT CERVICAL SPINE W/O CM
3 of 4 series · 9 of 33 positions shown, 11 images · non-contrast
Comparison: July 13, 2015

CLINICAL DATA: Worsening anxiety with suicidal ideation.

EXAM:
CT CERVICAL SPINE WITHOUT CONTRAST
TECHNIQUE: Multidetector CT imaging of the cervical spine was performed without
intravenous contrast. Multiplanar CT image reconstructions were also
generated.

[Series 6: sagittal bone · sagittal · 0.23mm/px · 5 of 86 slices shown, 6 images]
[im 29/86  bone]
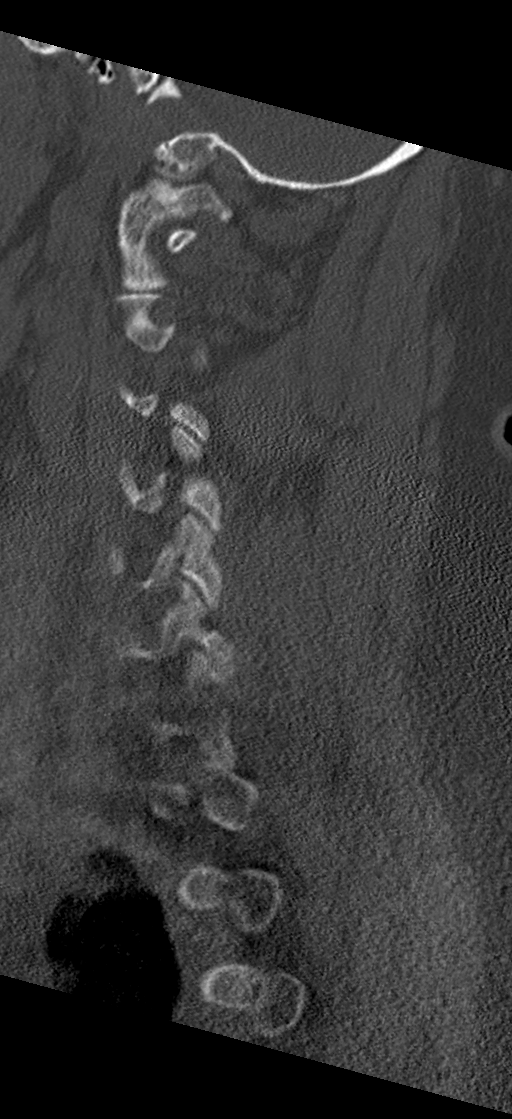
[im 36/86  bone]
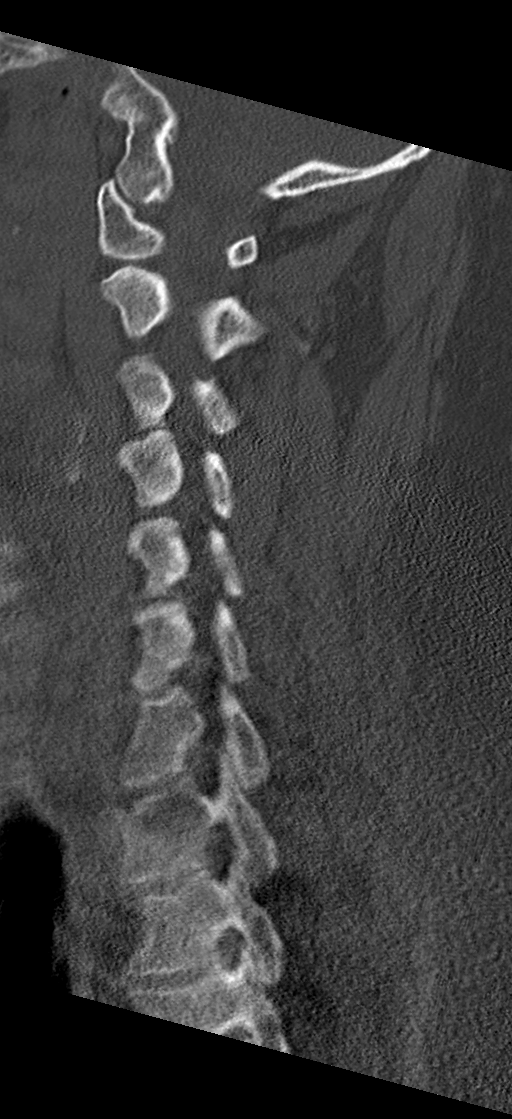
[im 43/86  soft-tissue]
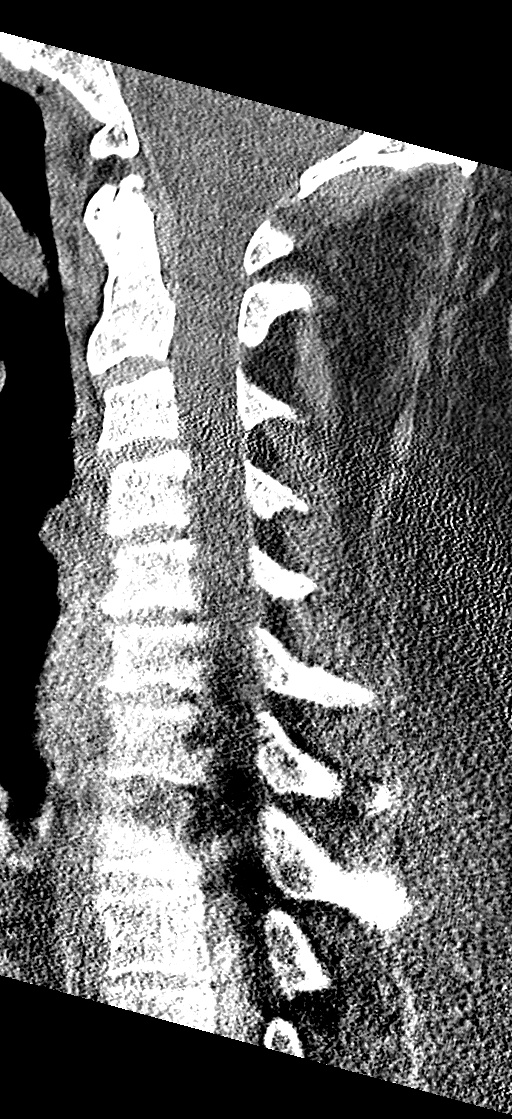
[im 43/86  bone]
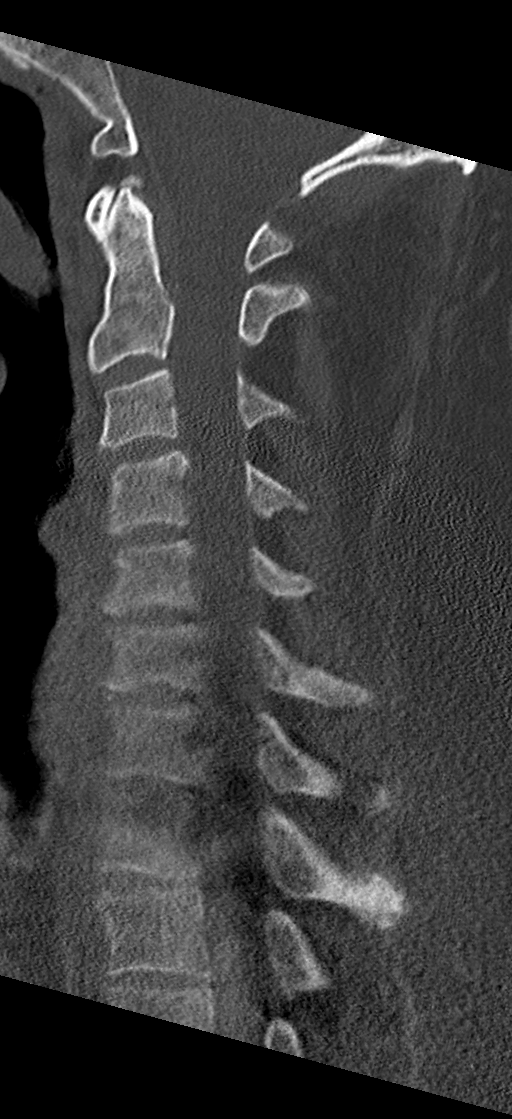
[im 50/86  bone]
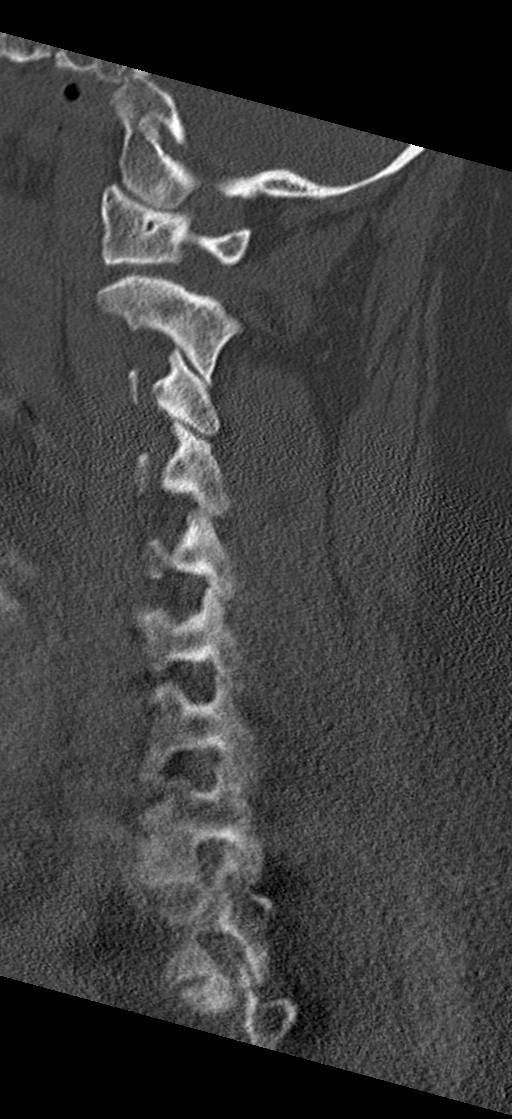
[im 57/86  bone]
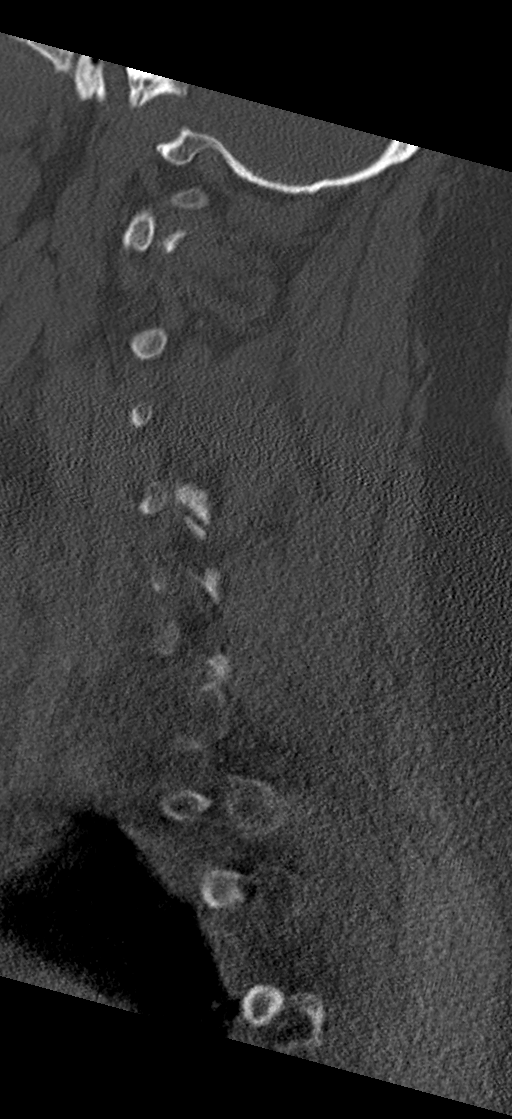

[Series 7: coronal bone · coronal · 0.33mm/px · 3 of 60 slices shown]
[im 12/60  bone]
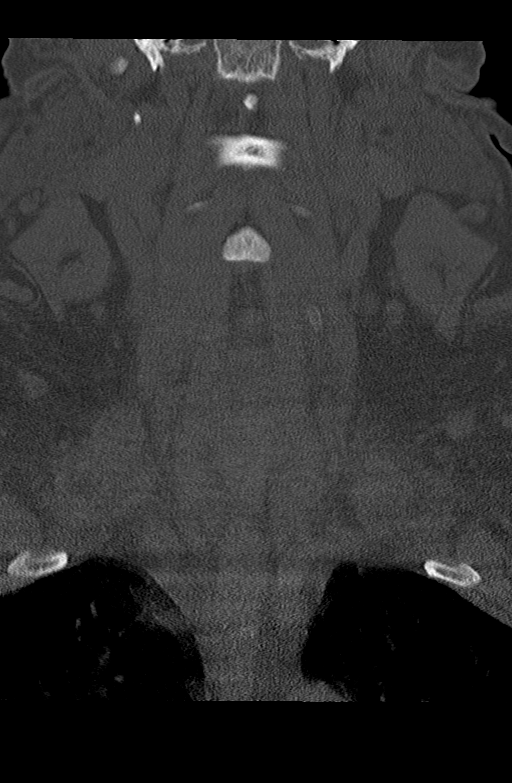
[im 24/60  bone]
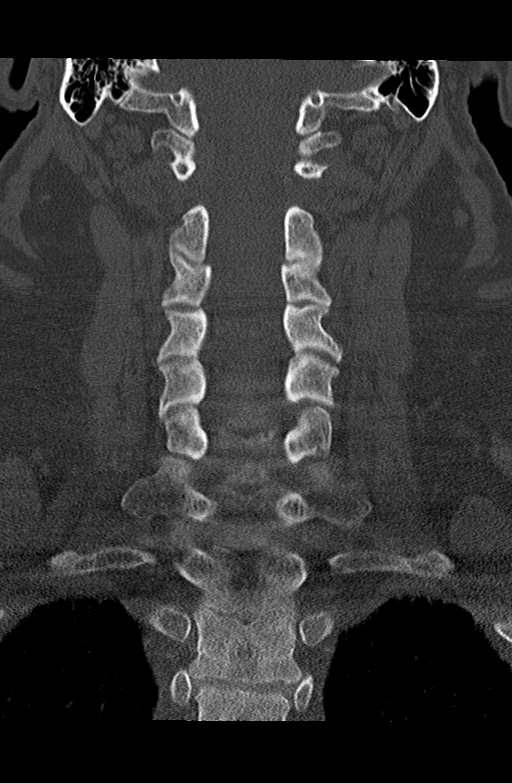
[im 36/60  bone]
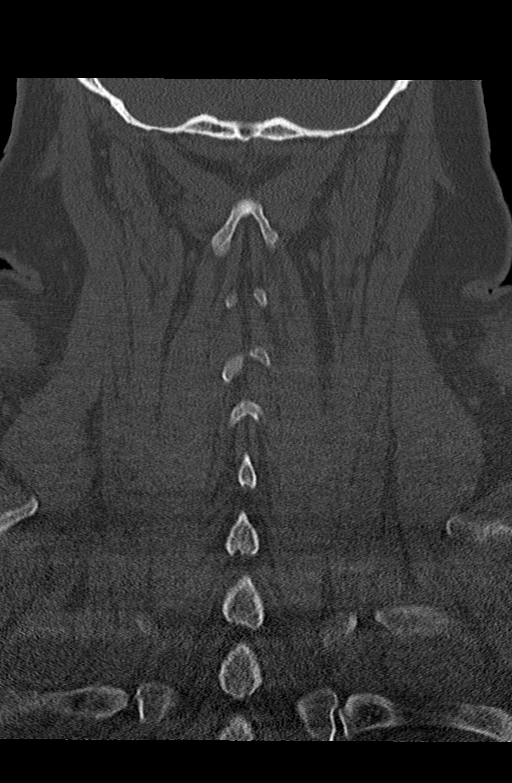

[Series 8: orthogonal bone · axial · 0.23mm/px · z∈[+590,+590]mm · 1 of 128 slices shown, 2 images]
[im 73/128  soft-tissue]
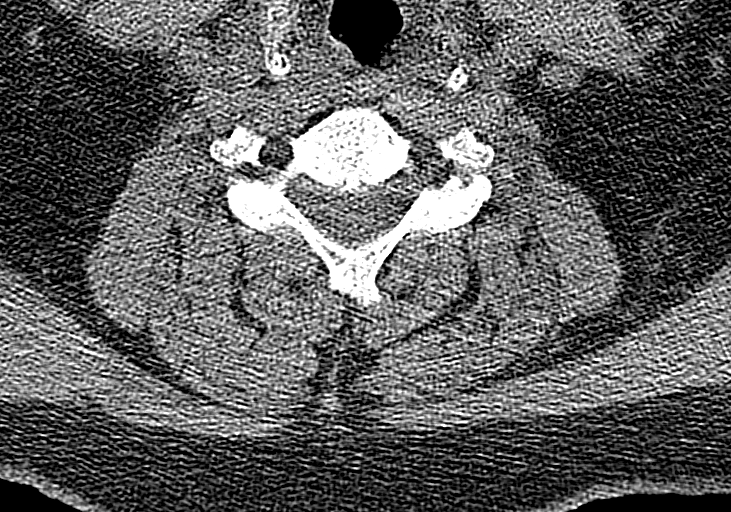
[im 73/128  bone]
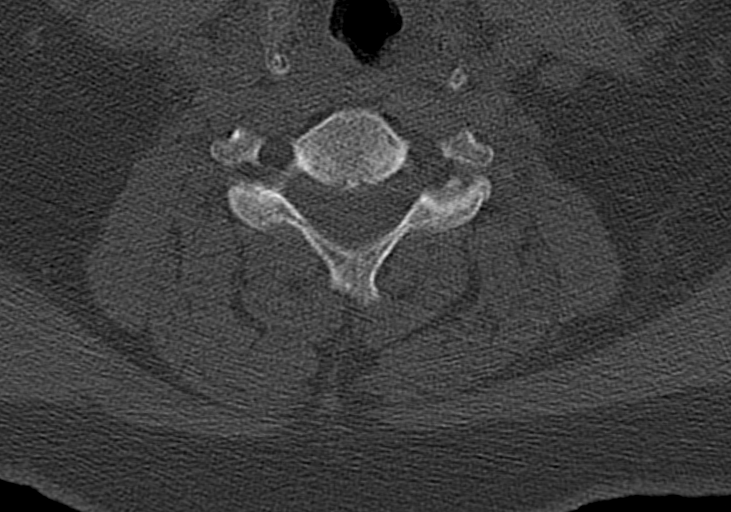

[9 of 33 positions shown; findings below may reference images not displayed]

FINDINGS: Alignment: Normal.

Skull base and vertebrae: No acute fracture. No primary bone lesion
or focal pathologic process.

Soft tissues and spinal canal: No prevertebral fluid or swelling. No
visible canal hematoma.

Disc levels: Normal multilevel endplates are seen with normal
multilevel intervertebral disc spaces.

Very mild bilateral multilevel facet joint hypertrophy is noted.

Upper chest: Negative.

Other: None.
IMPRESSION: 1. No acute fracture or subluxation of the cervical spine.
2. Very mild bilateral multilevel facet joint hypertrophy.

## 2021-11-01 IMAGING — CT CT HEAD W/O CM
3 series · 16 of 47 positions shown, 19 images · non-contrast
Comparison: August 25, 2019

CLINICAL DATA: Suicidal ideation.

EXAM:
CT HEAD WITHOUT CONTRAST
TECHNIQUE: Contiguous axial images were obtained from the base of the skull
through the vertex without intravenous contrast.

[Series 3: head wo · axial · 0.45mm/px · z∈[+701,+831]mm · 10 of 32 slices shown, 13 images]
[im 3/32  brain]
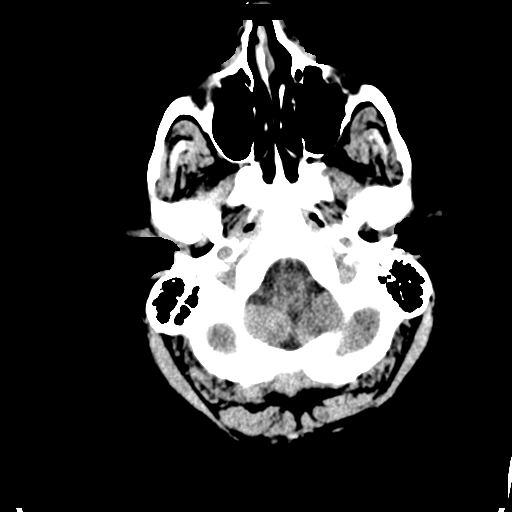
[im 3/32  bone]
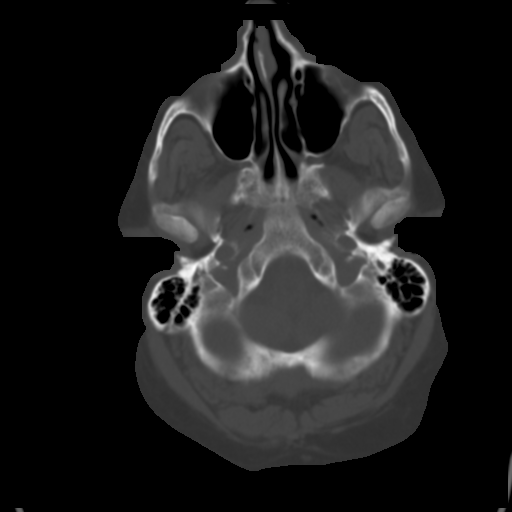
[im 6/32  brain]
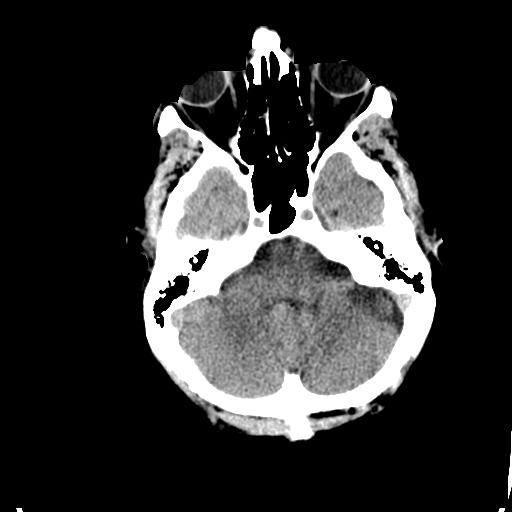
[im 9/32  brain]
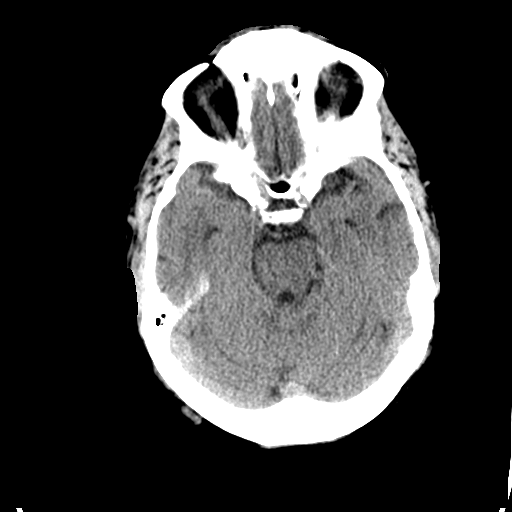
[im 11/32  brain]
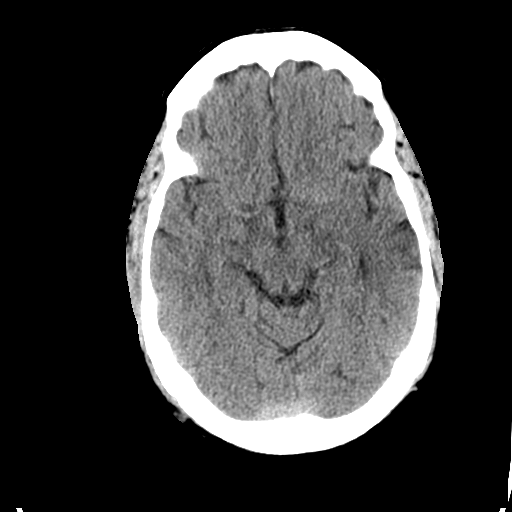
[im 14/32  brain]
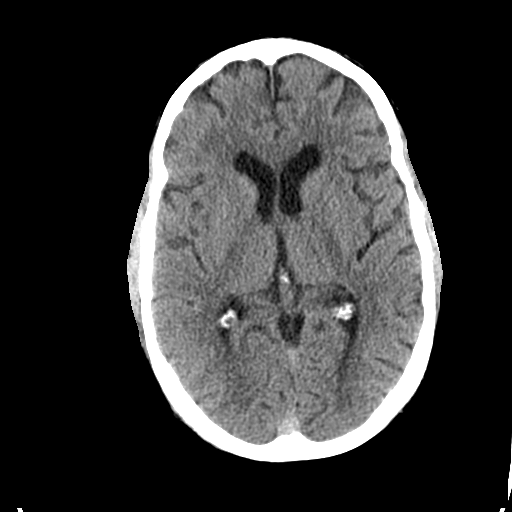
[im 14/32  bone]
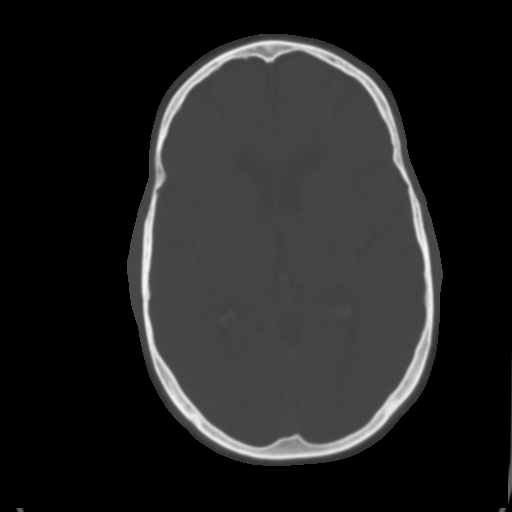
[im 18/32  brain]
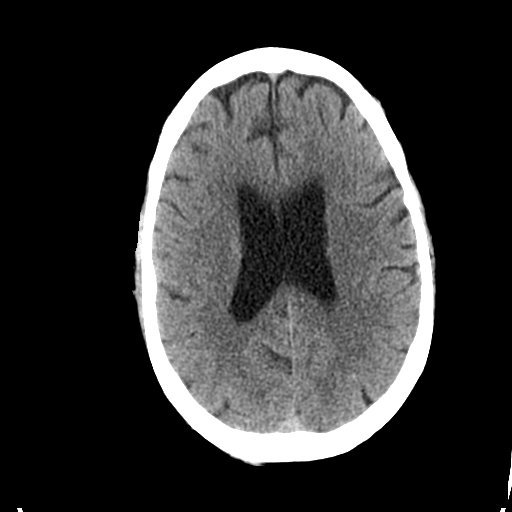
[im 21/32  brain]
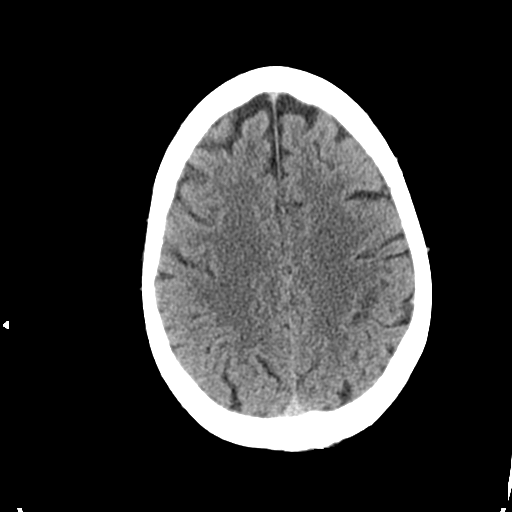
[im 24/32  brain]
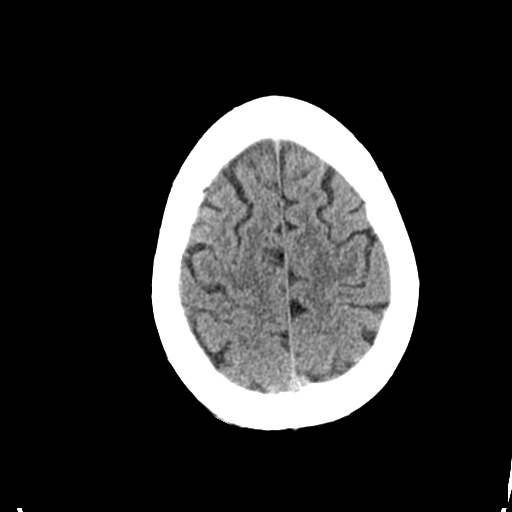
[im 26/32  brain]
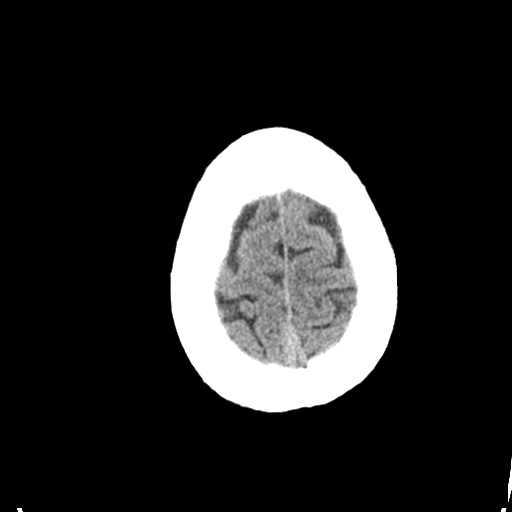
[im 26/32  bone]
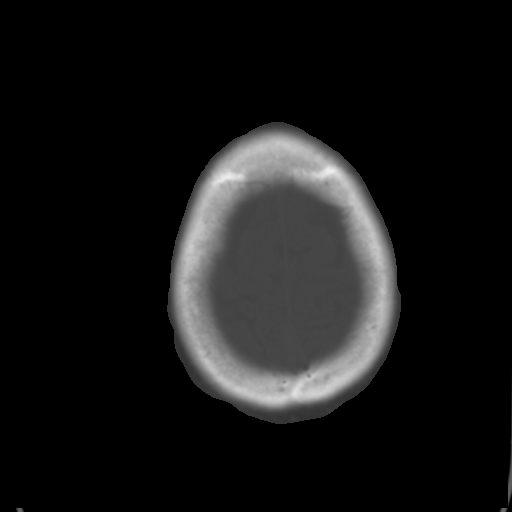
[im 29/32  brain]
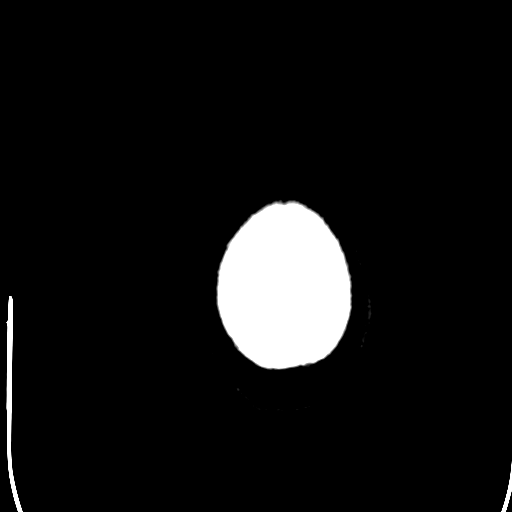

[Series 4: coronal soft tissue · coronal · 0.29mm/px · 3 of 66 slices shown]
[im 24/66  brain]
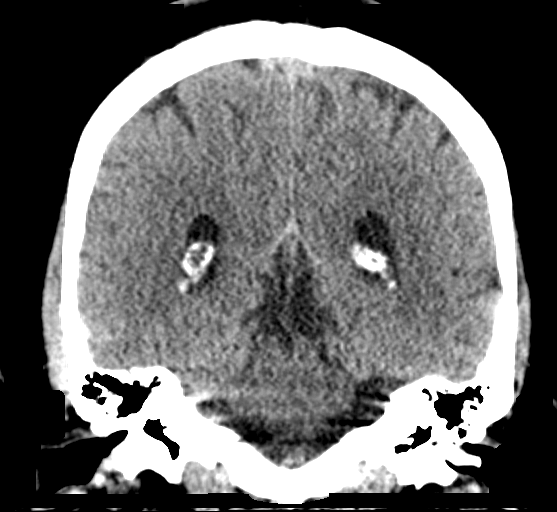
[im 30/66  brain]
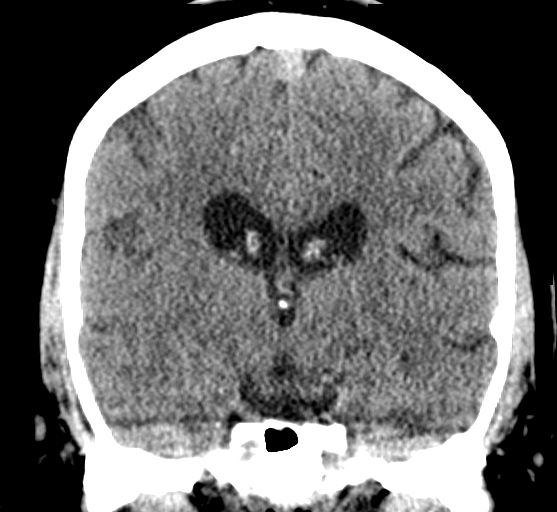
[im 36/66  brain]
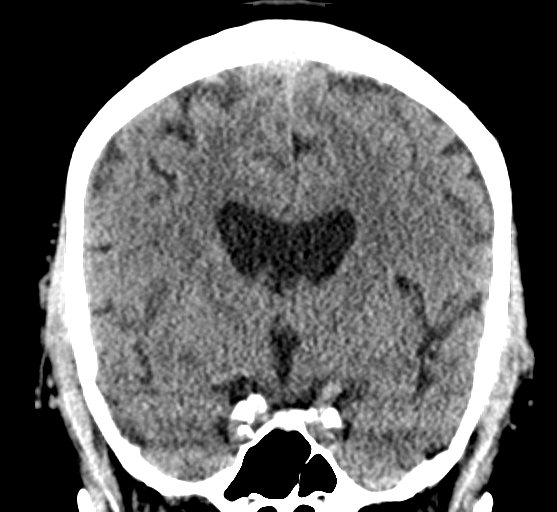

[Series 5: sagittal soft tissue · sagittal · 0.29mm/px · 3 of 54 slices shown]
[im 18/54  brain]
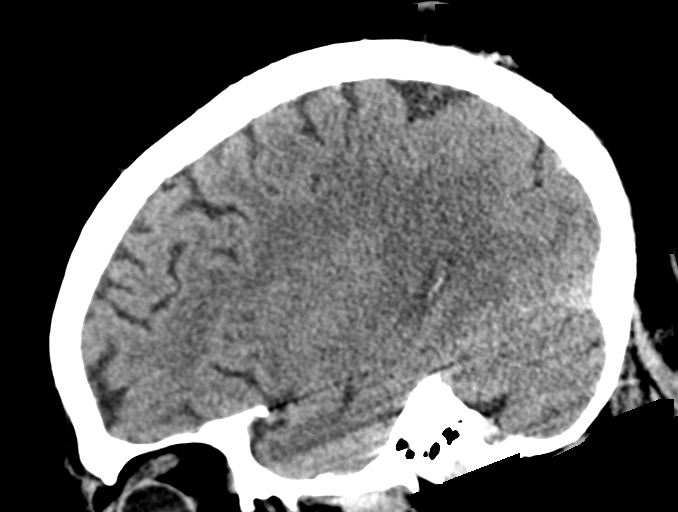
[im 27/54  brain]
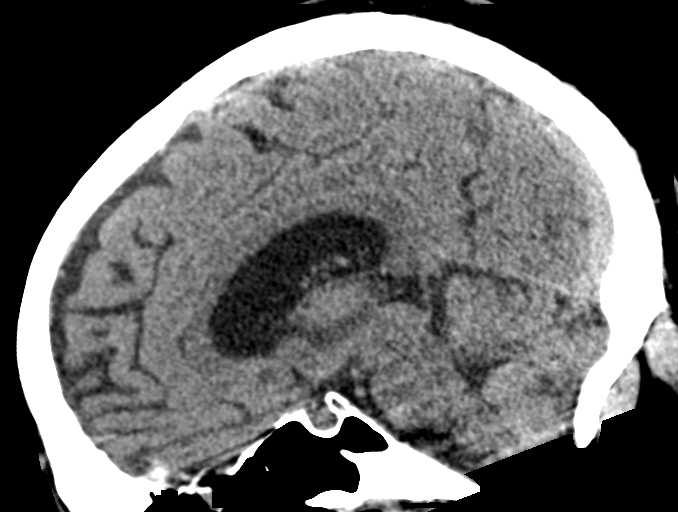
[im 36/54  brain]
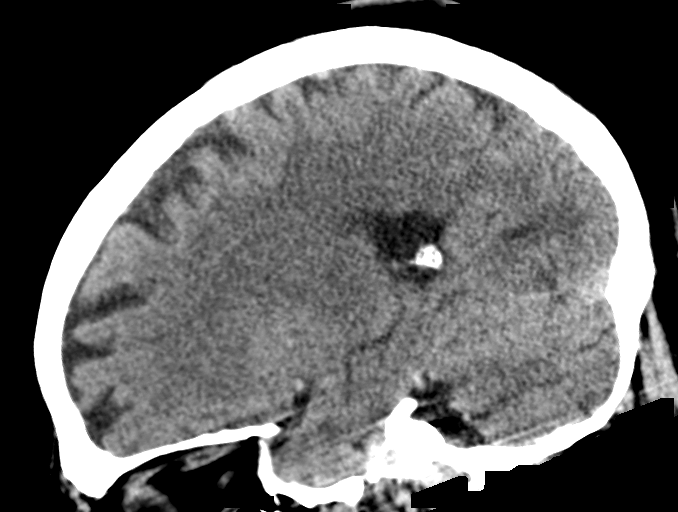

[16 of 47 positions shown; findings below may reference images not displayed]

FINDINGS: Brain: There is mild cerebral atrophy with widening of the
extra-axial spaces and ventricular dilatation.
There are areas of decreased attenuation within the white matter
tracts of the supratentorial brain, consistent with microvascular
disease changes.

Vascular: No hyperdense vessel or unexpected calcification.

Skull: Normal. Negative for fracture or focal lesion.

Sinuses/Orbits: No acute finding.

Other: None.
IMPRESSION: 1. Generalized cerebral atrophy.
2. No acute intracranial abnormality.

## 2021-12-11 ENCOUNTER — Ambulatory Visit: Admit: 2021-12-11 | Discharge: 2021-12-11 | Payer: MEDICAID

## 2021-12-11 ENCOUNTER — Encounter: Admit: 2021-12-11 | Discharge: 2021-12-11 | Payer: MEDICAID

## 2022-02-22 ENCOUNTER — Other Ambulatory Visit: Payer: Self-pay

## 2022-02-22 ENCOUNTER — Emergency Department: Payer: Medicaid Other

## 2022-02-22 ENCOUNTER — Emergency Department
Admission: EM | Admit: 2022-02-22 | Discharge: 2022-02-22 | Disposition: A | Discharge status: Home / Self Care | Payer: Medicaid Other | Attending: Emergency Medicine | Admitting: Emergency Medicine

## 2022-02-22 DIAGNOSIS — T502X5A Adverse effect of carbonic-anhydrase inhibitors, benzothiadiazides and other diuretics, initial encounter: Secondary | ICD-10-CM | POA: Insufficient documentation

## 2022-02-22 DIAGNOSIS — I10 Essential (primary) hypertension: Secondary | ICD-10-CM | POA: Insufficient documentation

## 2022-02-22 DIAGNOSIS — R0689 Other abnormalities of breathing: Secondary | ICD-10-CM | POA: Insufficient documentation

## 2022-02-22 DIAGNOSIS — J410 Simple chronic bronchitis: Secondary | ICD-10-CM

## 2022-02-22 DIAGNOSIS — T50905A Adverse effect of unspecified drugs, medicaments and biological substances, initial encounter: Secondary | ICD-10-CM

## 2022-02-22 DIAGNOSIS — R252 Cramp and spasm: Secondary | ICD-10-CM | POA: Diagnosis present

## 2022-02-22 LAB — COMPREHENSIVE METABOLIC PANEL
ALT: 20 U/L (ref 0–44)
AST: 18 U/L (ref 15–41)
Albumin: 3.9 g/dL (ref 3.5–5.0)
Alkaline Phosphatase: 69 U/L (ref 38–126)
Anion gap: 8 (ref 5–15)
BUN: 13 mg/dL (ref 8–23)
CO2: 34 mmol/L — ABNORMAL HIGH (ref 22–32)
Calcium: 8.5 mg/dL — ABNORMAL LOW (ref 8.9–10.3)
Chloride: 94 mmol/L — ABNORMAL LOW (ref 98–111)
Creatinine, Ser: 1.11 mg/dL (ref 0.61–1.24)
GFR, Estimated: >60 mL/min (ref >60)
Glucose, Bld: 260 mg/dL — ABNORMAL HIGH (ref 70–99)
Potassium: 4 mmol/L (ref 3.5–5.1)
Sodium: 136 mmol/L (ref 135–145)
Total Bilirubin: 0.4 mg/dL (ref 0.3–1.2)
Total Protein: 7.1 g/dL (ref 6.5–8.1)

## 2022-02-22 LAB — BRAIN NATRIURETIC PEPTIDE: B Natriuretic Peptide: 69.4 pg/mL (ref 0.0–100.0)

## 2022-02-22 LAB — CBC WITH DIFFERENTIAL/PLATELET
Abs Immature Granulocytes: 0.06 10*3/uL (ref 0.00–0.07)
Basophils Absolute: 0.1 10*3/uL (ref 0.0–0.1)
Basophils Relative: 0 %
Eosinophils Absolute: 0.5 10*3/uL (ref 0.0–0.5)
Eosinophils Relative: 4 %
HCT: 51.1 % (ref 39.0–52.0)
Hemoglobin: 16 g/dL (ref 13.0–17.0)
Immature Granulocytes: 1 %
Lymphocytes Relative: 27 %
Lymphs Abs: 3.3 10*3/uL (ref 0.7–4.0)
MCH: 27.9 pg (ref 26.0–34.0)
MCHC: 31.3 g/dL (ref 30.0–36.0)
MCV: 89.2 fL (ref 80.0–100.0)
Monocytes Absolute: 0.6 10*3/uL (ref 0.1–1.0)
Monocytes Relative: 5 %
Neutro Abs: 7.7 10*3/uL (ref 1.7–7.7)
Neutrophils Relative %: 63 %
Platelets: 185 10*3/uL (ref 150–400)
RBC: 5.73 MIL/uL (ref 4.22–5.81)
RDW: 12.8 % (ref 11.5–15.5)
WBC: 12.2 10*3/uL — ABNORMAL HIGH (ref 4.0–10.5)
nRBC: 0 % (ref 0.0–0.2)

## 2022-02-22 LAB — MAGNESIUM: Magnesium: 0.9 mg/dL — CL (ref 1.7–2.4)

## 2022-02-22 MED ORDER — DIAZEPAM 5 MG PO TABS
5.0000 mg | ORAL_TABLET | Freq: Once | ORAL | Status: AC
  Administered 2022-02-22: 5 mg via ORAL
  Filled 2022-02-22: qty 1

## 2022-02-22 MED ORDER — MAGNESIUM SULFATE 2 GM/50ML IV SOLN
2.0000 g | Freq: Once | INTRAVENOUS | Status: AC
  Administered 2022-02-22: 2 g via INTRAVENOUS
  Filled 2022-02-22: qty 50

## 2022-02-22 MED ORDER — IPRATROPIUM-ALBUTEROL 0.5-2.5 (3) MG/3ML IN SOLN
3.0000 mL | Freq: Once | RESPIRATORY_TRACT | Status: DC
Start: 2022-02-22 — End: 2022-02-22

## 2022-02-22 MED ORDER — IPRATROPIUM-ALBUTEROL 0.5-2.5 (3) MG/3ML IN SOLN
3.0000 mL | Freq: Once | RESPIRATORY_TRACT | Status: AC
  Administered 2022-02-22: 3 mL via RESPIRATORY_TRACT
  Filled 2022-02-22: qty 3

## 2022-02-22 MED ORDER — MAGNESIUM OXIDE 400 MG PO TABS
400.0000 mg | ORAL_TABLET | Freq: Two times a day (BID) | ORAL | 0 refills | Status: AC
Start: 2022-02-22 — End: 2022-03-01

## 2022-02-22 MED ORDER — CYCLOBENZAPRINE HCL 10 MG PO TABS: 10.0000 mg | ORAL_TABLET | Freq: Two times a day (BID) | ORAL | 0 refills | Status: AC | PRN

## 2022-02-22 MED ORDER — ALBUTEROL SULFATE (2.5 MG/3ML) 0.083% IN NEBU
3.0000 mL | INHALATION_SOLUTION | Freq: Once | RESPIRATORY_TRACT | Status: AC
  Administered 2022-02-22: 3 mL via RESPIRATORY_TRACT
  Filled 2022-02-22: qty 3

## 2022-02-22 MED ORDER — MAGNESIUM OXIDE -MG SUPPLEMENT 400 (240 MG) MG PO TABS
400.0000 mg | ORAL_TABLET | Freq: Once | ORAL | Status: AC
  Administered 2022-02-22: 400 mg via ORAL
  Filled 2022-02-22: qty 1

## 2022-02-22 MED ORDER — POTASSIUM CHLORIDE CRYS ER 20 MEQ PO TBCR
40.0000 meq | EXTENDED_RELEASE_TABLET | Freq: Once | ORAL | Status: AC
  Administered 2022-02-22: 40 meq via ORAL
  Filled 2022-02-22: qty 2

## 2022-02-22 MED ORDER — PREDNISONE 20 MG PO TABS: 40.0000 mg | ORAL_TABLET | Freq: Every day | ORAL | 0 refills | Status: AC

## 2022-02-22 MED ORDER — METHYLPREDNISOLONE SODIUM SUCC 125 MG IJ SOLR
125.0000 mg | Freq: Once | INTRAMUSCULAR | Status: AC
Start: 2022-02-22 — End: 2022-02-22
  Administered 2022-02-22: 125 mg via INTRAVENOUS
  Filled 2022-02-22: qty 2

## 2022-02-22 NOTE — ED Provider Notes (Signed)
? ?[facility] ?Provider Note ? ? ? Event Date/Time  ? First MD Initiated Contact with Patient 02/22/22 1124   ?  (approximate) ? ? ?History  ? ?Abnormal Lab ? ? ?HPI ? ?Ian [name] is a 61 y.o. male here with abnormal lab.  The patient was sent in from his PCP.  He was seen yesterday due to progressively worsening bilateral leg cramping.  The patient has had progressive myalgias, leg cramps, for the last several weeks.  He was started on hydrochlorothiazide and states that his symptoms seem to begin then.  He was seen at his primary care physician and had lab work done yesterday which showed significant hypomagnesemia.  He was told to come in for repletion.  Reports that he also has not necessarily been eating a very balanced diet, and has eaten less than usual.  Denies any other major changes in his health.  No other major medication changes.  He states he has a "restless" leg anyways, but the cramping is new.  It is bilateral.  He denies any associated leg swelling.  No fevers or chills.  No numbness or weakness.  No history of seizures. ?  ? ? ?Physical Exam  ? ?Triage Vital Signs: ?ED Triage Vitals  ?Enc Vitals Group  ?   BP 02/22/22 1031 (!) 179/155  ?   Pulse Rate 02/22/22 1031 93  ?   Resp 02/22/22 1031 (!) 22  ?   Temp 02/22/22 1031 98.9 ?F (37.2 ?C)  ?   Temp Source 02/22/22 1031 Oral  ?   SpO2 02/22/22 1031 (!) 89 %  ?   Weight 02/22/22 1029 298 lb (135.2 kg)  ?   Height 02/22/22 1029 6' (1.829 m)  ?   Head Circumference --   ?   Peak Flow --   ?   Pain Score 02/22/22 1029 8  ?   Pain Loc --   ?   Pain Edu? --   ?   Excl. in [place]? --   ? ? ?Most recent vital signs: ?Vitals:  ? 02/22/22 1031 02/22/22 1330  ?BP: (!) 179/155 120/84  ?Pulse: 93 92  ?Resp: (!) 22 19  ?Temp: 98.9 ?F (37.2 ?C)   ?SpO2: (!) 89% 90%  ? ? ? ?General: Awake, no distress.  ?CV:  Good peripheral perfusion.  Regular rate and rhythm. ?Resp:  Normal effort.  Expiratory wheezes noted diffusely, there are normal  work of breathing and speaking in full sentences. ?Abd:  No distention.  No tenderness. ?Other:  Trace edema bilaterally.  Cranial nerves II through XII intact.  Normal strength and sensation bilateral upper and lower extremities.  Gait normal. ? ? ?ED Results / Procedures / Treatments  ? ?Labs ?(all labs ordered are listed, but only abnormal results are displayed) ? ?Labs Reviewed  ?CBC WITH DIFFERENTIAL/PLATELET - Abnormal; Notable for the following components:  ?    Result Value  ? WBC 12.2 (*)   ? All other components within normal limits  ?COMPREHENSIVE METABOLIC PANEL - Abnormal; Notable for the following components:  ? Chloride 94 (*)   ? CO2 34 (*)   ? Glucose, Bld 260 (*)   ? Calcium 8.5 (*)   ? All other components within normal limits  ?MAGNESIUM - Abnormal; Notable for the following components:  ? Magnesium 0.9 (*)   ? All other components within normal limits  ?BRAIN NATRIURETIC PEPTIDE  ? ? ? ?EKG ?Normal sinus rhythm, ventricular rate 96.  PR 160, QRS 86, QTc 442.  No acute ST elevations or depressions.  No EKG evidence of acute ischemia or infarct. ? ? ?RADIOLOGY ?Chest x-ray: Clear, no acute abnormality ? ?I also independently reviewed and agree wit radiologist interpretations. ? ? ?PROCEDURES: ? ?Critical Care performed: No ? ? ? ? ?MEDICATIONS ORDERED IN ED: ?Medications  ?magnesium sulfate IVPB 2 g 50 mL (0 g Intravenous Stopped 02/22/22 1245)  ?ipratropium-albuterol (DUONEB) 0.5-2.5 (3) MG/3ML nebulizer solution 3 mL (3 mLs Nebulization Given 02/22/22 1236)  ?ipratropium-albuterol (DUONEB) 0.5-2.5 (3) MG/3ML nebulizer solution 3 mL (3 mLs Nebulization Given 02/22/22 1236)  ?potassium chloride SA (KLOR-CON M) CR tablet 40 mEq (40 mEq Oral Given 02/22/22 1235)  ?magnesium oxide (MAG-OX) tablet 400 mg (400 mg Oral Given 02/22/22 1235)  ?diazepam (VALIUM) tablet 5 mg (5 mg Oral Given 02/22/22 1239)  ?methylPREDNISolone sodium succinate (SOLU-MEDROL) 125 mg/2 mL injection 125 mg (125 mg Intravenous Given 02/22/22  1413)  ?albuterol (PROVENTIL) (2.5 MG/3ML) 0.083% nebulizer solution 3 mL (3 mLs Inhalation Given 02/22/22 1413)  ? ? ? ?IMPRESSION / MDM / ASSESSMENT AND PLAN / ED COURSE  ?I reviewed the triage vital signs and the nursing notes. ?             ?               ? ? ?The patient is on the cardiac monitor to evaluate for evidence of arrhythmia and/or significant heart rate changes. ? ? ?MDM:  ?61 year old male with history of COPD, hypertension, tobacco use, depression, here with leg cramps.  This began after starting hydrochlorothiazide and lab work revealed significant hypomagnesemia.  Potassium is within normal limits.  Renal function is normal.  Patient is on a statin as well but has been on this for some time with no muscular tenderness, do not suspect rhabdo.  No neurological deficits.  Pulses are 2+ and symmetric with intact distal perfusion.  CBC largely unremarkable.  Chest x-ray clear.  BNP normal.  He does not appear overtly fluid overloaded.  Patient hypertensive and was initially recorded as possibly hypoxic on triage.  However, patient admits that he had not taken his blood pressure medications, or his breathing treatments and that he has chronic COPD with borderline hypoxia.  He admits he has not used his breathing treatments today.  He adamantly denies any increasing cough or shortness of breath or other symptoms of respiratory distress.  Patient given breathing treatments and albuterol inhaler here with improvement.  He is at his baseline O2 sat per review of records.  I do think he would benefit from a brief course of prednisone given the extent of his wheezing, but otherwise he can follow-up with his doctor.  Return precautions/red flags discussed. ? ?Following magnesium replacement and treatment, patient feels markedly improved.  We will discharge him with magnesium oxide for mag supplementation, instructions to discontinue hydrochlorothiazide, and to call his doctor tomorrow to discuss medication  changes as well as follow-up. ? ? ?MEDICATIONS GIVEN IN ED: ?Medications  ?magnesium sulfate IVPB 2 g 50 mL (0 g Intravenous Stopped 02/22/22 1245)  ?ipratropium-albuterol (DUONEB) 0.5-2.5 (3) MG/3ML nebulizer solution 3 mL (3 mLs Nebulization Given 02/22/22 1236)  ?ipratropium-albuterol (DUONEB) 0.5-2.5 (3) MG/3ML nebulizer solution 3 mL (3 mLs Nebulization Given 02/22/22 1236)  ?potassium chloride SA (KLOR-CON M) CR tablet 40 mEq (40 mEq Oral Given 02/22/22 1235)  ?magnesium oxide (MAG-OX) tablet 400 mg (400 mg Oral Given 02/22/22 1235)  ?diazepam (VALIUM) tablet 5 mg (5  mg Oral Given 02/22/22 1239)  ?methylPREDNISolone sodium succinate (SOLU-MEDROL) 125 mg/2 mL injection 125 mg (125 mg Intravenous Given 02/22/22 1413)  ?albuterol (PROVENTIL) (2.5 MG/3ML) 0.083% nebulizer solution 3 mL (3 mLs Inhalation Given 02/22/22 1413)  ? ? ? ?Consults:  ? ? ? ?EMR reviewed  ?Reviewed office visit from yesterday, noting cramping, hypertension on HCTZ, and reviewed lab work sent in epic ? ? ? ? ?FINAL CLINICAL IMPRESSION(S) / ED DIAGNOSES  ? ?Final diagnoses:  ?Hypomagnesemia  ?Adverse effect of drug, initial encounter  ?Simple chronic bronchitis ([place])  ? ? ? ?Rx / DC Orders  ? ?ED Discharge Orders   ? ?      Ordered  ?  magnesium oxide (MAG-OX) 400 MG tablet  2 times daily       ? 02/22/22 1416  ?  predniSONE (DELTASONE) 20 MG tablet  Daily       ? 02/22/22 1416  ?  cyclobenzaprine (FLEXERIL) 10 MG tablet  2 times daily PRN       ? 02/22/22 1416  ? ?  ?  ? ?  ? ? ? ?Note:  This document was prepared using Dragon voice recognition software and may include unintentional dictation errors. ?  ?Duffy Bruce, MD ?02/22/22 1419 ? ?

## 2022-02-22 NOTE — ED Notes (Signed)
Pt reports leg cramping for several months. Pt states he had blood work done yesterday & was called last night about his magnesium level being low & told to come to the ER. ?

## 2022-02-22 NOTE — ED Triage Notes (Signed)
Pt states he was sent by his doctors officer for low magnesium levels in his blood work- pt states he had the tests done yesterday and they called him last night with the results- pt does indorse leg cramps, but no other symptoms- pt was instructed by his dr to stop taking his HCTZ ?

## 2022-02-22 NOTE — Discharge Instructions (Signed)
For your magnesium, stop taking hydrochlorothiazide immediately.  Call your doctor tomorrow to let them know you are seen in the ER and given IV magnesium as well as a supplement. ? ?You should have labs redrawn within the next week. ? ?Discussed starting a different blood pressure medication with your doctor. ? ?Otherwise, for your wheezing, we will give you a brief course of steroids to help calm down the wheezing.  Use the albuterol inhaler every 4 hours as needed. ? ?Return to the ER as needed. ?

## 2022-02-22 NOTE — ED Notes (Signed)
Pt given urinal per pt request. ?

## 2022-02-22 NOTE — ED Notes (Signed)
Pt placed on 2L [place]

## 2022-04-03 IMAGING — DX DG CHEST 1V PORT
2 series · 2 of 2 positions shown · non-contrast
Comparison: Chest radiograph dated 09/05/2020.

CLINICAL DATA: 59-year-old male with chest pain.

EXAM:
PORTABLE CHEST 1 VIEW

[chest ap (1 of 2)]
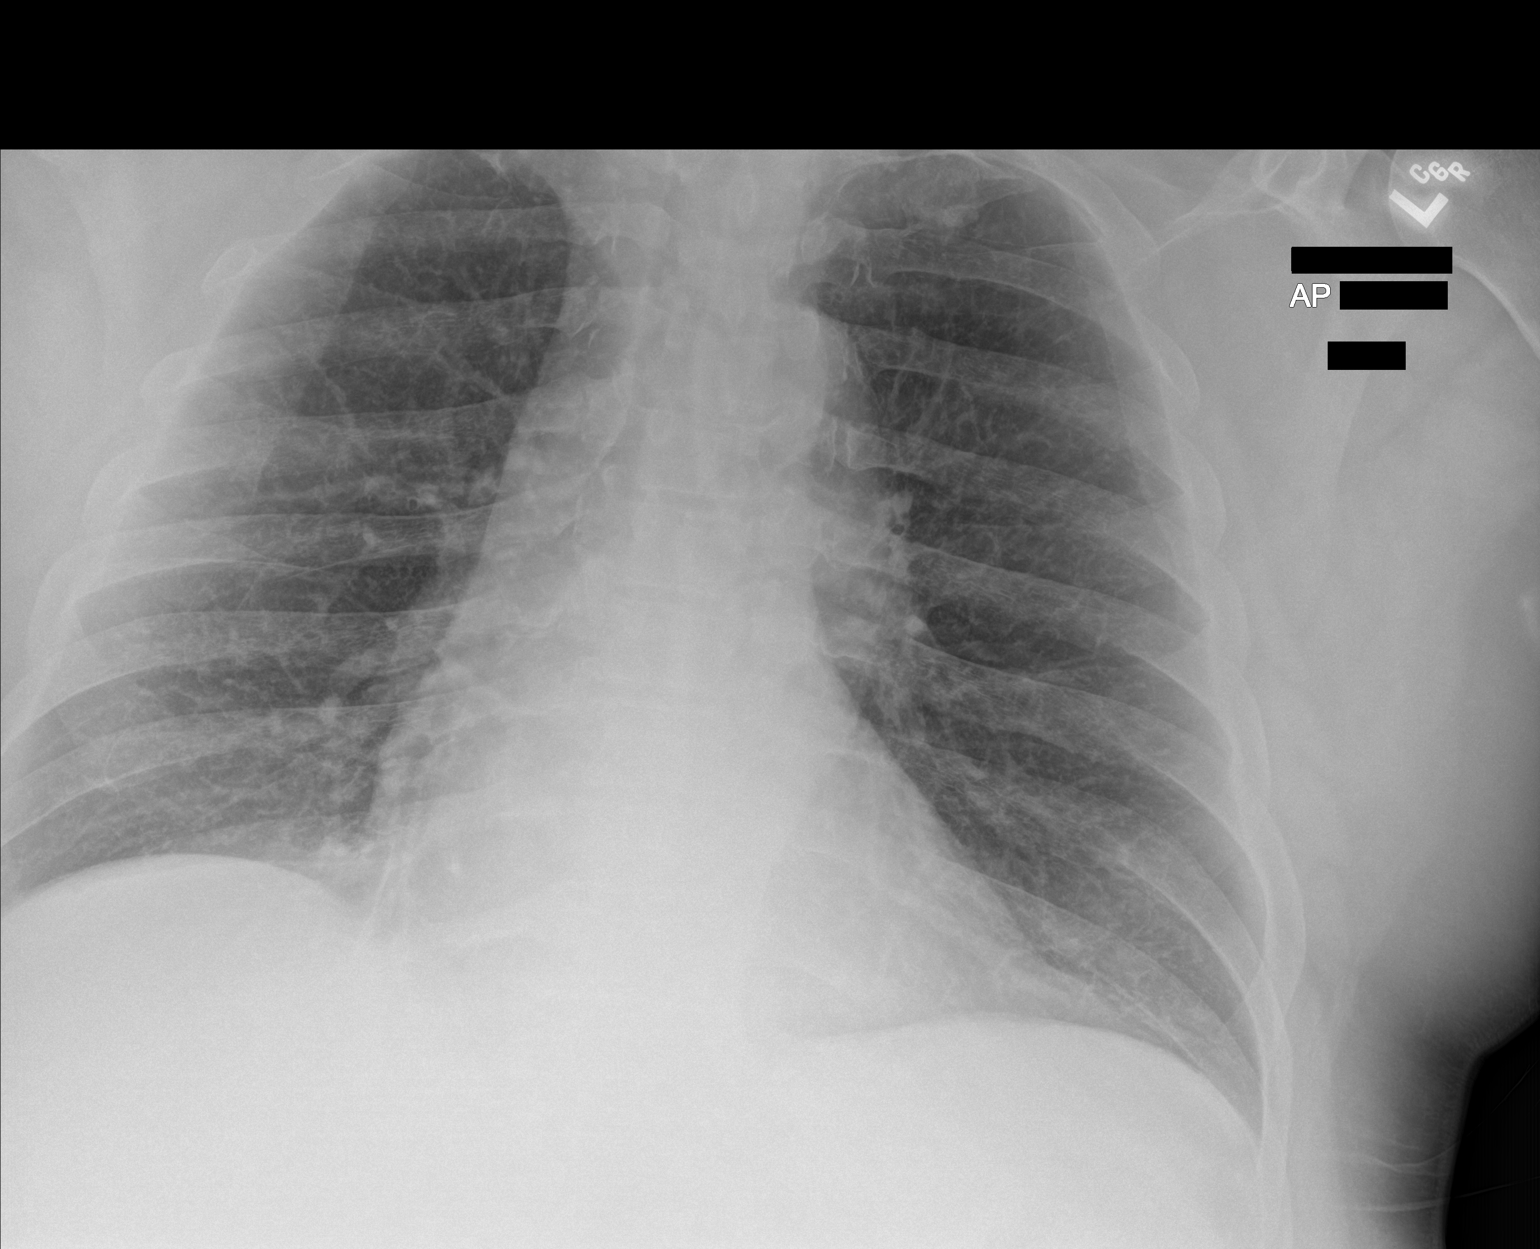

[chest ap (2 of 2)]
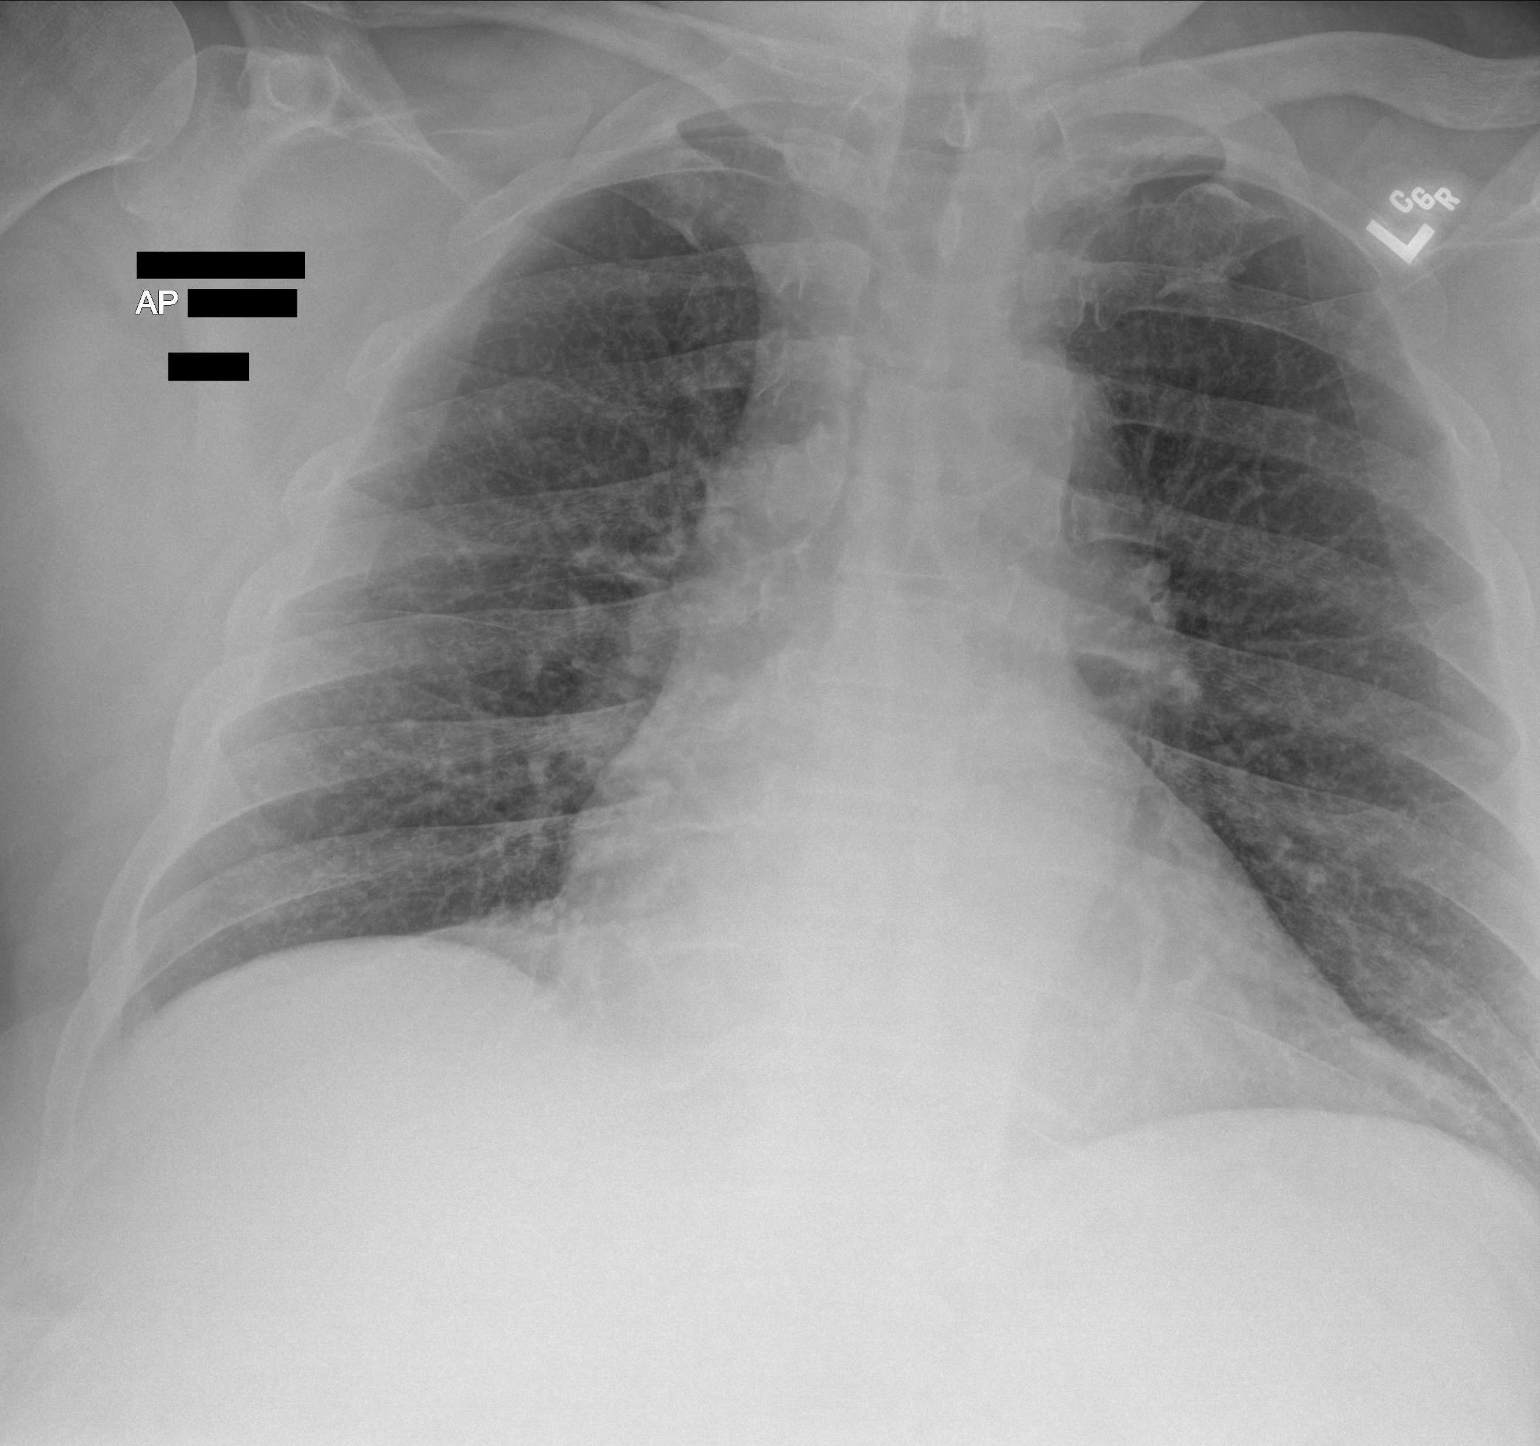

[2 of 2 positions shown; findings below may reference images not displayed]

FINDINGS: There is mild diffuse chronic interstitial coarsening. No focal
consolidation, pleural effusion, or pneumothorax. Stable cardiac
silhouette. No acute osseous pathology.
IMPRESSION: No active disease.

## 2022-10-06 ENCOUNTER — Inpatient Hospital Stay: Payer: Medicaid Other

## 2022-10-06 ENCOUNTER — Inpatient Hospital Stay: Payer: Medicaid Other | Attending: Internal Medicine | Admitting: Internal Medicine

## 2022-10-06 ENCOUNTER — Encounter: Payer: Self-pay | Admitting: Internal Medicine

## 2022-10-06 VITALS — BP 144/94 | HR 87 | Temp 98.8°F | Ht 72.0 in | Wt 277.0 lb

## 2022-10-06 DIAGNOSIS — Z79899 Other long term (current) drug therapy: Secondary | ICD-10-CM | POA: Insufficient documentation

## 2022-10-06 DIAGNOSIS — D72829 Elevated white blood cell count, unspecified: Secondary | ICD-10-CM | POA: Insufficient documentation

## 2022-10-06 DIAGNOSIS — I10 Essential (primary) hypertension: Secondary | ICD-10-CM | POA: Insufficient documentation

## 2022-10-06 DIAGNOSIS — Z803 Family history of malignant neoplasm of breast: Secondary | ICD-10-CM | POA: Diagnosis not present

## 2022-10-06 DIAGNOSIS — Z806 Family history of leukemia: Secondary | ICD-10-CM | POA: Insufficient documentation

## 2022-10-06 DIAGNOSIS — F17210 Nicotine dependence, cigarettes, uncomplicated: Secondary | ICD-10-CM | POA: Insufficient documentation

## 2022-10-06 DIAGNOSIS — F1290 Cannabis use, unspecified, uncomplicated: Secondary | ICD-10-CM | POA: Diagnosis not present

## 2022-10-06 DIAGNOSIS — I2510 Atherosclerotic heart disease of native coronary artery without angina pectoris: Secondary | ICD-10-CM | POA: Diagnosis not present

## 2022-10-06 DIAGNOSIS — J449 Chronic obstructive pulmonary disease, unspecified: Secondary | ICD-10-CM | POA: Diagnosis not present

## 2022-10-06 DIAGNOSIS — R7303 Prediabetes: Secondary | ICD-10-CM | POA: Diagnosis not present

## 2022-10-06 LAB — SEDIMENTATION RATE: Sed Rate: 2 mm/hr (ref 0–20)

## 2022-10-06 LAB — C-REACTIVE PROTEIN: CRP: 0.7 mg/dL (ref <1.0)

## 2022-10-06 NOTE — Progress Notes (Signed)
[place]  [phone] [phone]) [phone]  ID: [name] OB: 1961/08/19  MR#: [id number]  GBT#:[id number]  Patient Care Team: Hilton Sinclair, Hershal Coria as PCP - General (Family Medicine) Patient, No Pcp Per (General Practice)  REFERRING PROVIDER: Hilton Sinclair, PA-C  REASON FOR REFERRAL: leucocytosis  HPI: Montgomery Favor is a 61 y.o. male with past medical history of hypertension, prediabetes, Barrett's esophagus, esophageal adenocarcinoma follows with Dr. Pollyann Glen at [facility], COPD, CAD was referred to hematology for work-up of leukocytosis.  Patient reports feeling well.  He has cramps in his both legs predominantly on the right side.  He reports getting magnesium infusion 3 years ago in ER for medical 0.9.  He continues to have low mag.  He takes magnesium oxide 400 mg 1 to 2 tablets every day.  He denies any fever, chills, weight loss, bowel bladder issues, shortness of breath or chest pain.  Denies any infection. He is a chronic smoker with 1 pack/day since age 44.  Also smokes marijuana every other day.  He has been using albuterol 1-2 times per day for past 3 years.  He is also on Advair daily.  Labs reviewed.  Most recent from 09/24/2022.  WBC 13.8, hemoglobin 16.1, platelet 226.  WBC has been intermittently elevated dating back to 2018 ranging 10-15,000.  Has intermittent neutrophilia.  CMP unremarkable.    REVIEW OF SYSTEMS:   ROS  As per HPI. Otherwise, a complete review of systems is negative.  PAST MEDICAL HISTORY: Past Medical History:  Diagnosis Date   Anxiety    Barrett esophagus    Cancer (HCC)    COPD (chronic obstructive pulmonary disease) ([place])    Coronary artery disease    Depression    GERD (gastroesophageal reflux disease)    Hypertension    Pre-diabetes    Stroke ([place]) 2014   "mini-stroke" per patient    PAST SURGICAL HISTORY: Past Surgical History:  Procedure Laterality Date   ANGIOPLASTY     APPENDECTOMY      CARDIAC CATHETERIZATION     CHOLECYSTECTOMY     WRIST SURGERY Right    age 67    FAMILY HISTORY: Family History  Problem Relation Age of Onset   Stroke Father    Leukemia Father    Breast cancer Sister     HEALTH MAINTENANCE: Social History   Tobacco Use   Smoking status: Every Day    Packs/day: 1.50    Types: Cigarettes   Smokeless tobacco: Never  Vaping Use   Vaping Use: Never used  Substance Use Topics   Alcohol use: Not Currently   Drug use: Yes    Types: Cocaine, Marijuana     Allergies  Allergen Reactions   Fluoxetine Swelling    Anxiety, itching   Hydrocodone-Acetaminophen Itching   Mirtazapine Other (See Comments)    nightmares   Tramadol Other (See Comments)   Gabapentin Other (See Comments)    Nightmares     Current Outpatient Medications  Medication Sig Dispense Refill   albuterol (VENTOLIN HFA) 108 (90 Base) MCG/ACT inhaler Inhale 2 puffs into the lungs every 6 (six) hours as needed for wheezing or shortness of breath. 18 g 1   atorvastatin (LIPITOR) 40 MG tablet Take 1 tablet (40 mg total) by mouth daily. 30 tablet 1   busPIRone (BUSPAR) 10 MG tablet Take 2 tablets (20 mg total) by mouth 3 (three) times daily. 180 tablet 1   clonazePAM (KLONOPIN) 0.5 MG tablet Take 1 tablet (  0.5 mg total) by mouth 2 (two) times daily as needed (Anxiety). 60 tablet 1   escitalopram (LEXAPRO) 20 MG tablet Take 1 tablet (20 mg total) by mouth daily. 30 tablet 1   fluticasone-salmeterol (ADVAIR) 500-50 MCG/ACT AEPB Inhale into the lungs.     lisinopril (ZESTRIL) 20 MG tablet Take 1 tablet (20 mg total) by mouth daily. 30 tablet 1   magnesium oxide (MAG-OX) 400 MG tablet Take by mouth.     metFORMIN (GLUCOPHAGE) 1000 MG tablet Take 1 tablet (1,000 mg total) by mouth 2 (two) times daily with a meal. 60 tablet 1   pantoprazole (PROTONIX) 40 MG tablet Take 1 tablet (40 mg total) by mouth daily. 30 tablet 1   QUEtiapine (SEROQUEL XR) 400 MG 24 hr tablet Take 1 tablet (400  mg total) by mouth at bedtime. 30 tablet 1   rOPINIRole (REQUIP XL) 2 MG 24 hr tablet Take by mouth.     zolpidem (AMBIEN) 10 MG tablet Take 1 tablet (10 mg total) by mouth at bedtime as needed for sleep. 30 tablet 1   aspirin 81 MG chewable tablet Chew 1 tablet (81 mg total) by mouth daily. (Patient not taking: Reported on 06/06/2021) 30 tablet 1   cyclobenzaprine (FLEXERIL) 10 MG tablet Take 1 tablet (10 mg total) by mouth 2 (two) times daily as needed for muscle spasms. (Patient not taking: Reported on 10/06/2022) 20 tablet 0   hydrOXYzine (ATARAX/VISTARIL) 25 MG tablet Take 1 tablet (25 mg total) by mouth 3 (three) times daily as needed for anxiety. (Patient not taking: Reported on 10/06/2022) 90 tablet 1   mometasone-formoterol (DULERA) 200-5 MCG/ACT AERO Inhale 2 puffs into the lungs 2 (two) times daily. (Patient not taking: Reported on 06/06/2021) 1 each 1   No current facility-administered medications for this visit.    OBJECTIVE: Vitals:   10/06/22 1150  BP: (!) 144/94  Pulse: 87  Temp: 98.8 F (37.1 C)     Body mass index is 37.57 kg/m.      General: Well-developed, well-nourished, no acute distress. Eyes: Pink conjunctiva, anicteric sclera. HEENT: Normocephalic, moist mucous membranes, clear oropharnyx. Lungs: Clear to auscultation bilaterally. Heart: Regular rate and rhythm. No rubs, murmurs, or gallops. Abdomen: Soft, nontender, nondistended. No organomegaly noted, normoactive bowel sounds. Musculoskeletal: No edema, cyanosis, or clubbing. Neuro: Alert, answering all questions appropriately. Cranial nerves grossly intact. Skin: No rashes or petechiae noted. Psych: Normal affect. Lymphatics: No cervical, calvicular, axillary or inguinal LAD.   LAB RESULTS:  Lab Results  Component Value Date   NA 136 02/22/2022   K 4.0 02/22/2022   CL 94 (L) 02/22/2022   CO2 34 (H) 02/22/2022   GLUCOSE 260 (H) 02/22/2022   BUN 13 02/22/2022   CREATININE 1.11 02/22/2022   CALCIUM  8.5 (L) 02/22/2022   PROT 7.1 02/22/2022   ALBUMIN 3.9 02/22/2022   AST 18 02/22/2022   ALT 20 02/22/2022   ALKPHOS 69 02/22/2022   BILITOT 0.4 02/22/2022   GFRNONAA >60 02/22/2022   GFRAA >60 09/05/2020    Lab Results  Component Value Date   WBC 12.2 (H) 02/22/2022   NEUTROABS 7.7 02/22/2022   HGB 16.0 02/22/2022   HCT 51.1 02/22/2022   MCV 89.2 02/22/2022   PLT 185 02/22/2022    No results found for: "TIBC", "FERRITIN", "IRONPCTSAT"   STUDIES: No results found.  ASSESSMENT AND PLAN:   Asaf Elmquist is a 61 y.o. male with pmh of hypertension, prediabetes, Barrett's esophagus follows at [facility],  COPD, CAD was referred to hematology for work-up of leukocytosis.  #Leukocytosis #Neutrophilia -Unclear etiology -Labs reviewed.  Most recent from 09/24/2022.  WBC 13.8, hemoglobin 16.1, platelet 226.  WBC has been intermittently elevated dating back to 2018 ranging 10-15,000.  Has intermittent neutrophilia.  CMP unremarkable. -Labs as below for work-up -He is a chronic smoker with 1 pack/day since age 67.  Also smokes marijuana every other day.  He has been using albuterol 1-2 times per day for past 3 years.  He is also on Advair daily.  Orders Placed This Encounter  Procedures   Sedimentation rate   C-reactive protein   Flow cytometry panel-leukemia/lymphoma work-up   BCR-ABL1 FISH   RTC in 2 weeks with MyChart video to discuss labs.  Patient expressed understanding and was in agreement with this plan. He also understands that He can call clinic at any time with any questions, concerns, or complaints.   I spent a total of 45 minutes reviewing chart data, face-to-face evaluation with the patient, counseling and coordination of care as detailed above.  Jane Canary, MD   10/06/2022 12:39 PM

## 2022-10-06 NOTE — Progress Notes (Signed)
Patient here as a new patient to establish care for elevated blood cells. Patient denies any concerns at this time.

## 2022-10-08 LAB — COMP PANEL: LEUKEMIA/LYMPHOMA

## 2022-10-09 LAB — BCR-ABL1 FISH
Cells Analyzed: 200
Cells Counted: 200

## 2022-10-20 ENCOUNTER — Encounter: Payer: Self-pay | Admitting: Internal Medicine

## 2022-10-20 ENCOUNTER — Inpatient Hospital Stay (HOSPITAL_BASED_OUTPATIENT_CLINIC_OR_DEPARTMENT_OTHER): Payer: Medicaid Other | Admitting: Internal Medicine

## 2022-10-20 DIAGNOSIS — F17200 Nicotine dependence, unspecified, uncomplicated: Secondary | ICD-10-CM

## 2022-10-20 DIAGNOSIS — D72829 Elevated white blood cell count, unspecified: Secondary | ICD-10-CM | POA: Diagnosis not present

## 2022-10-20 NOTE — Progress Notes (Signed)
[facility]  [phone] [phone]  I connected with Ian [name] on 10/20/22 at  3:00 PM EDT by my chart video and verified that I am speaking with the correct person using two identifiers.   I discussed the limitations, risks, security and privacy concerns of performing an evaluation and management service by telemedicine and the availability of in-person appointments. I also discussed with the patient that there may be a patient responsible charge related to this service. The patient expressed understanding and agreed to proceed.   Other persons participating in the visit and their role in the encounter: none   Patient's location: home  Provider's location: office   Chief Complaint: leucocytosis, discuss lab results   ID: Ian [name] OB: 01/03/1961  MR#: [id number]  CSN#:[id number]  Patient Care Team: [name], Emily, PA-C as PCP - General (Family Medicine) Patient, No Pcp Per (General Practice)  REFERRING PROVIDER: Emily [name], PA-C  REASON FOR REFERRAL: leucocytosis  HPI: Ian [name] is a 61 y.o. male with past medical history of hypertension, prediabetes, Barrett's esophagus, esophageal adenocarcinoma follows with Dr. Hopsicker at [facility], COPD, CAD was referred to hematology for work-up of leukocytosis.  Patient reports feeling well.  He has cramps in his both legs predominantly on the right side.  He reports getting magnesium infusion 3 years ago in ER for medical 0.9.  He continues to have low mag.  He takes magnesium oxide 400 mg 1 to 2 tablets every day.  He denies any fever, chills, weight loss, bowel bladder issues, shortness of breath or chest pain.  Denies any infection. He is a chronic smoker with 1 pack/day since age 14.  Also smokes marijuana every other day.  He has been using albuterol 1-2 times per day for past 3 years.  He is also on Advair daily.  Labs reviewed.  Most recent from 09/24/2022.  WBC 13.8,  hemoglobin 16.1, platelet 226.  WBC has been intermittently elevated dating back to 2018 ranging 10-15,000.  Has intermittent neutrophilia.  CMP unremarkable.  INTERVAL HISTORY-  Patient was seen today to discuss about the lab results for leukocytosis. He continues to have cramps in his bilateral leg.  He was started on ropinirole and Ingrezza with not much improvement.  He has been feeling sedated with Ingrezza and will talk to his PCP.  Otherwise denies any fever, chills, nausea, vomiting  REVIEW OF SYSTEMS:   Review of Systems  Constitutional:  Negative for chills, fever and weight loss.  Respiratory:  Negative for cough, shortness of breath and wheezing.   Cardiovascular:  Negative for chest pain and palpitations.  Gastrointestinal:  Negative for abdominal pain, heartburn, nausea and vomiting.  Musculoskeletal:  Negative for myalgias and neck pain.       Leg cramps  Neurological:  Negative for dizziness.    As per HPI. Otherwise, a complete review of systems is negative.  PAST MEDICAL HISTORY: Past Medical History:  Diagnosis Date   Anxiety    Barrett esophagus    Cancer (HCC)    COPD (chronic obstructive pulmonary disease) (HCC)    Coronary artery disease    Depression    GERD (gastroesophageal reflux disease)    Hypertension    Pre-diabetes    Stroke (HCC) 2014   "mini-stroke" per patient    PAST SURGICAL HISTORY: Past Surgical History:  Procedure Laterality Date   ANGIOPLASTY     APPENDECTOMY     CARDIAC CATHETERIZATION     CHOLECYSTECTOMY       WRIST SURGERY Right    age 58    FAMILY HISTORY: Family History  Problem Relation Age of Onset   Stroke Father    Leukemia Father    Breast cancer Sister     HEALTH MAINTENANCE: Social History   Tobacco Use   Smoking status: Every Day    Packs/day: 1.50    Types: Cigarettes   Smokeless tobacco: Never  Vaping Use   Vaping Use: Never used  Substance Use Topics   Alcohol use: Not Currently   Drug use: Yes     Types: Cocaine, Marijuana     Allergies  Allergen Reactions   Fluoxetine Swelling    Anxiety, itching   Hydrocodone-Acetaminophen Itching   Mirtazapine Other (See Comments)    nightmares   Tramadol Other (See Comments)   Gabapentin Other (See Comments)    Nightmares     Current Outpatient Medications  Medication Sig Dispense Refill   albuterol (VENTOLIN HFA) 108 (90 Base) MCG/ACT inhaler Inhale 2 puffs into the lungs every 6 (six) hours as needed for wheezing or shortness of breath. 18 g 1   aspirin 81 MG chewable tablet Chew 1 tablet (81 mg total) by mouth daily. 30 tablet 1   atorvastatin (LIPITOR) 40 MG tablet Take 1 tablet (40 mg total) by mouth daily. 30 tablet 1   busPIRone (BUSPAR) 10 MG tablet Take 2 tablets (20 mg total) by mouth 3 (three) times daily. 180 tablet 1   clonazePAM (KLONOPIN) 0.5 MG tablet Take 1 tablet (0.5 mg total) by mouth 2 (two) times daily as needed (Anxiety). 60 tablet 1   cyclobenzaprine (FLEXERIL) 10 MG tablet Take 1 tablet (10 mg total) by mouth 2 (two) times daily as needed for muscle spasms. 20 tablet 0   fluticasone-salmeterol (ADVAIR) 500-50 MCG/ACT AEPB Inhale into the lungs.     lisinopril (ZESTRIL) 20 MG tablet Take 1 tablet (20 mg total) by mouth daily. 30 tablet 1   magnesium oxide (MAG-OX) 400 MG tablet Take by mouth.     metFORMIN (GLUCOPHAGE) 1000 MG tablet Take 1 tablet (1,000 mg total) by mouth 2 (two) times daily with a meal. 60 tablet 1   pantoprazole (PROTONIX) 40 MG tablet Take 1 tablet (40 mg total) by mouth daily. 30 tablet 1   QUEtiapine (SEROQUEL XR) 400 MG 24 hr tablet Take 1 tablet (400 mg total) by mouth at bedtime. 30 tablet 1   rOPINIRole (REQUIP XL) 2 MG 24 hr tablet Take by mouth.     zolpidem (AMBIEN) 10 MG tablet Take 1 tablet (10 mg total) by mouth at bedtime as needed for sleep. 30 tablet 1   escitalopram (LEXAPRO) 20 MG tablet Take 1 tablet (20 mg total) by mouth daily. 30 tablet 1   hydrOXYzine (ATARAX/VISTARIL) 25  MG tablet Take 1 tablet (25 mg total) by mouth 3 (three) times daily as needed for anxiety. (Patient not taking: Reported on 10/06/2022) 90 tablet 1   mometasone-formoterol (DULERA) 200-5 MCG/ACT AERO Inhale 2 puffs into the lungs 2 (two) times daily. (Patient not taking: Reported on 06/06/2021) 1 each 1   No current facility-administered medications for this visit.    OBJECTIVE: There were no vitals filed for this visit.    There is no height or weight on file to calculate BMI.      Physical exam not performed televideo visit.   LAB RESULTS:  Lab Results  Component Value Date   NA 136 02/22/2022   K 4.0 02/22/2022  CL 94 (L) 02/22/2022   CO2 34 (H) 02/22/2022   GLUCOSE 260 (H) 02/22/2022   BUN 13 02/22/2022   CREATININE 1.11 02/22/2022   CALCIUM 8.5 (L) 02/22/2022   PROT 7.1 02/22/2022   ALBUMIN 3.9 02/22/2022   AST 18 02/22/2022   ALT 20 02/22/2022   ALKPHOS 69 02/22/2022   BILITOT 0.4 02/22/2022   GFRNONAA >60 02/22/2022   GFRAA >60 09/05/2020    Lab Results  Component Value Date   WBC 12.2 (H) 02/22/2022   NEUTROABS 7.7 02/22/2022   HGB 16.0 02/22/2022   HCT 51.1 02/22/2022   MCV 89.2 02/22/2022   PLT 185 02/22/2022    No results found for: "TIBC", "FERRITIN", "IRONPCTSAT"   STUDIES: No results found.  ASSESSMENT AND PLAN:   Ian [name] is a 61 y.o. male with pmh of hypertension, prediabetes, Barrett's esophagus follows at [facility], COPD, CAD was referred to hematology for work-up of leukocytosis.  #Leukocytosis #Neutrophilia -Labs reviewed.  Most recent from 09/24/2022.  WBC 13.8, hemoglobin 16.1, platelet 226.  WBC has been intermittently elevated dating back to 2018 ranging 10-15,000.  Has intermittent neutrophilia.  CMP unremarkable.  -ESR/CRP normal.  Flow cytometry was negative.  BCR/ABL by FISH was negative.  -He is a chronic smoker with 1 pack/day since age 84.  Also smokes marijuana every other day.  He has been using albuterol 1-2 times per  day for past 3 years.  He is also on Advair daily.  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  We discussed about smoking cessation.  Patient reports he has been thinking about it but does not feel he is absolutely ready.  No changes in the use of inhaler schedule required.  He can continue to follow with his PCP for CBC with differential annually.  If has worsening leukocytosis with symptoms such as but not limited to fevers, weight loss, shortness of breath, fatigue he can be referred back to me.  He will see me as needed.  #Leg cramps -Managed by PCP.  Was started on propranolol and Ingrezza.  Reports no improvement.  #History of esophageal adenocarcinoma -Follows with Dr. Pollyann Glen at [facility]  No orders of the defined types were placed in this encounter.  As needed follow-up.  Patient expressed understanding and was in agreement with this plan. He also understands that He can call clinic at any time with any questions, concerns, or complaints.   I spent a total of 30 minutes reviewing chart data, face-to-face evaluation with the patient, counseling and coordination of care as detailed above.  Jane Canary, MD   10/20/2022 3:02 PM

## 2022-12-20 IMAGING — CR DG CHEST 2V
1 series · 2 of 2 positions shown · non-contrast
Comparison: June 06, 2021

CLINICAL DATA: sob

EXAM:
CHEST - 2 VIEW

[Series 1: dg chest 2 view · 0.14mm/px · 2 of 2 slices shown]
[im 1/2]
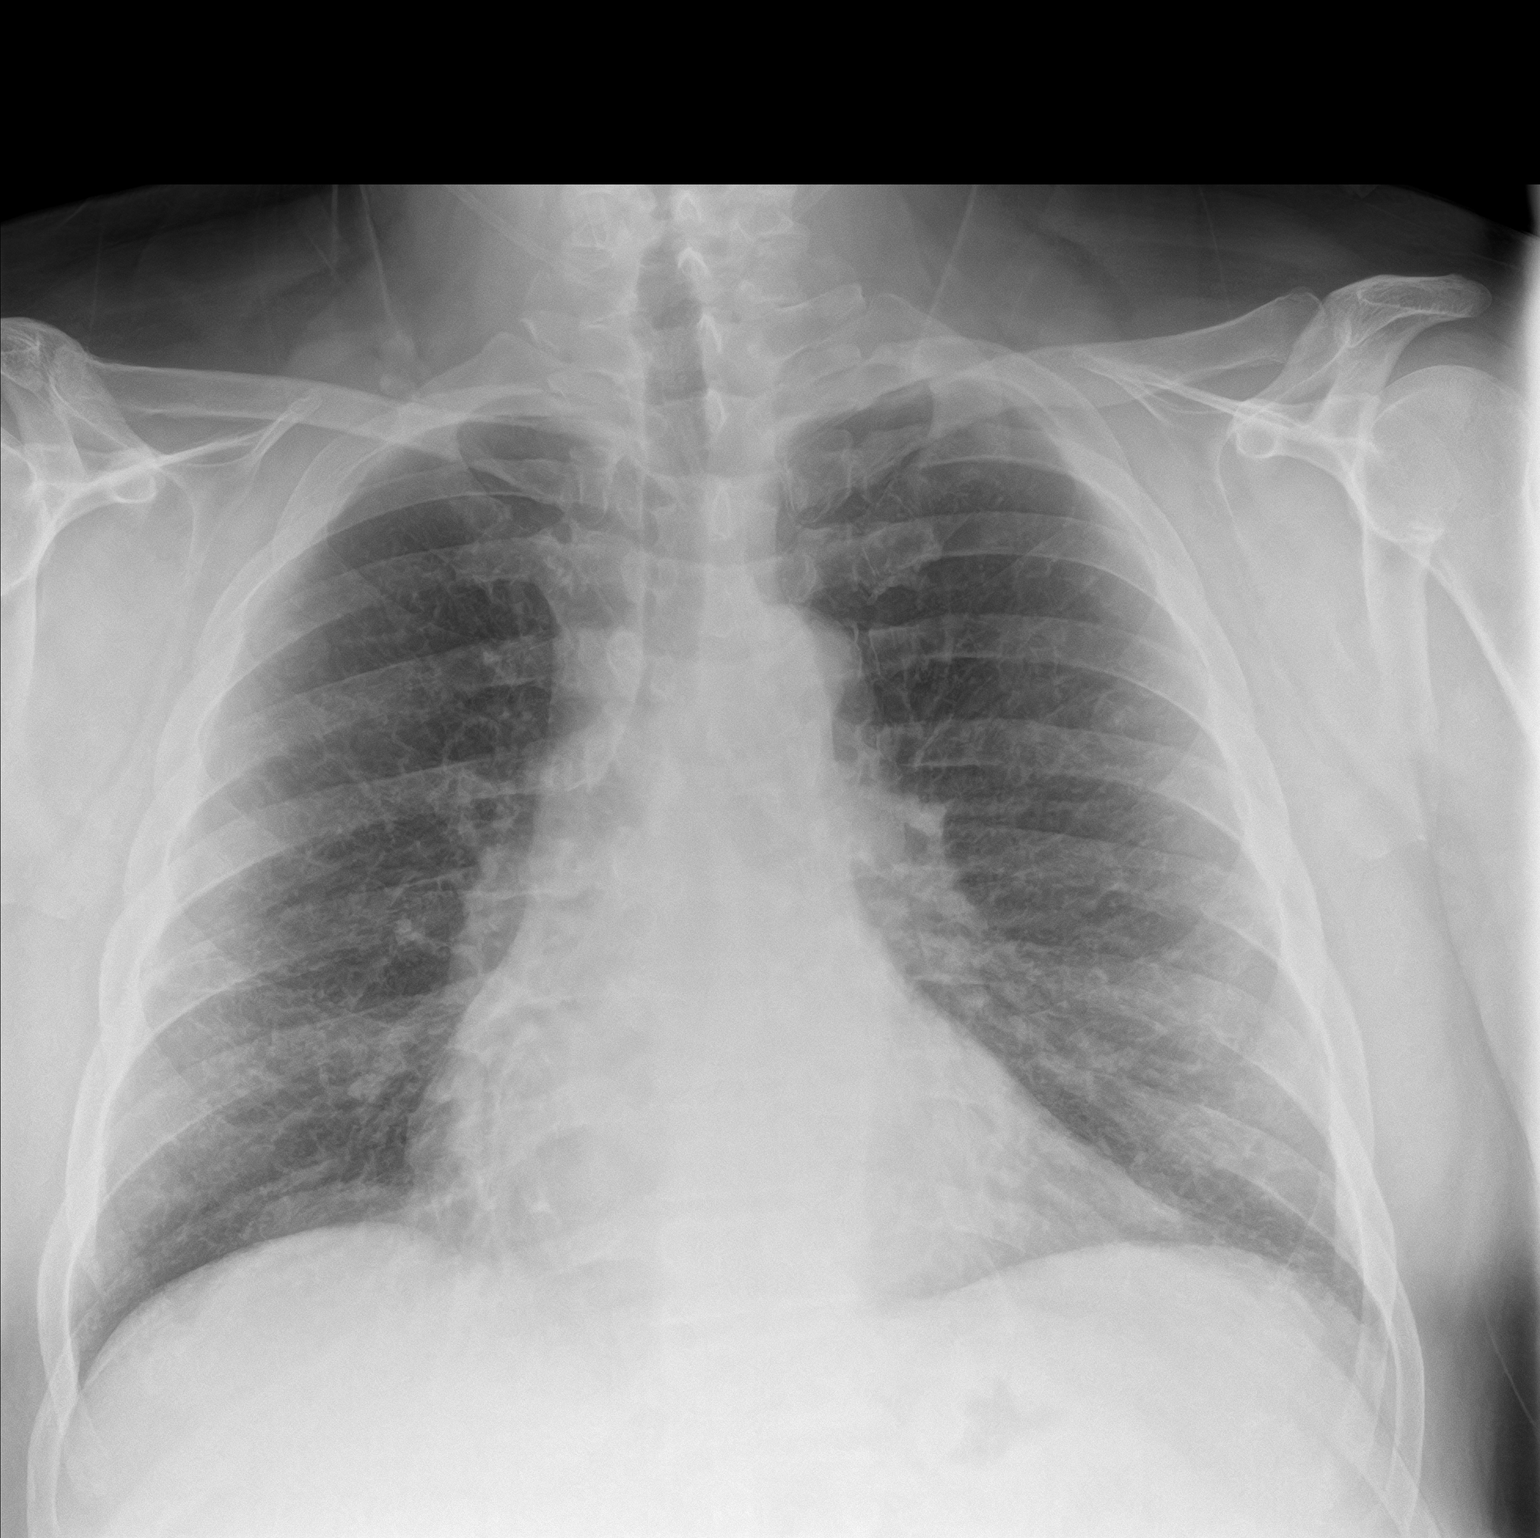
[im 2/2]
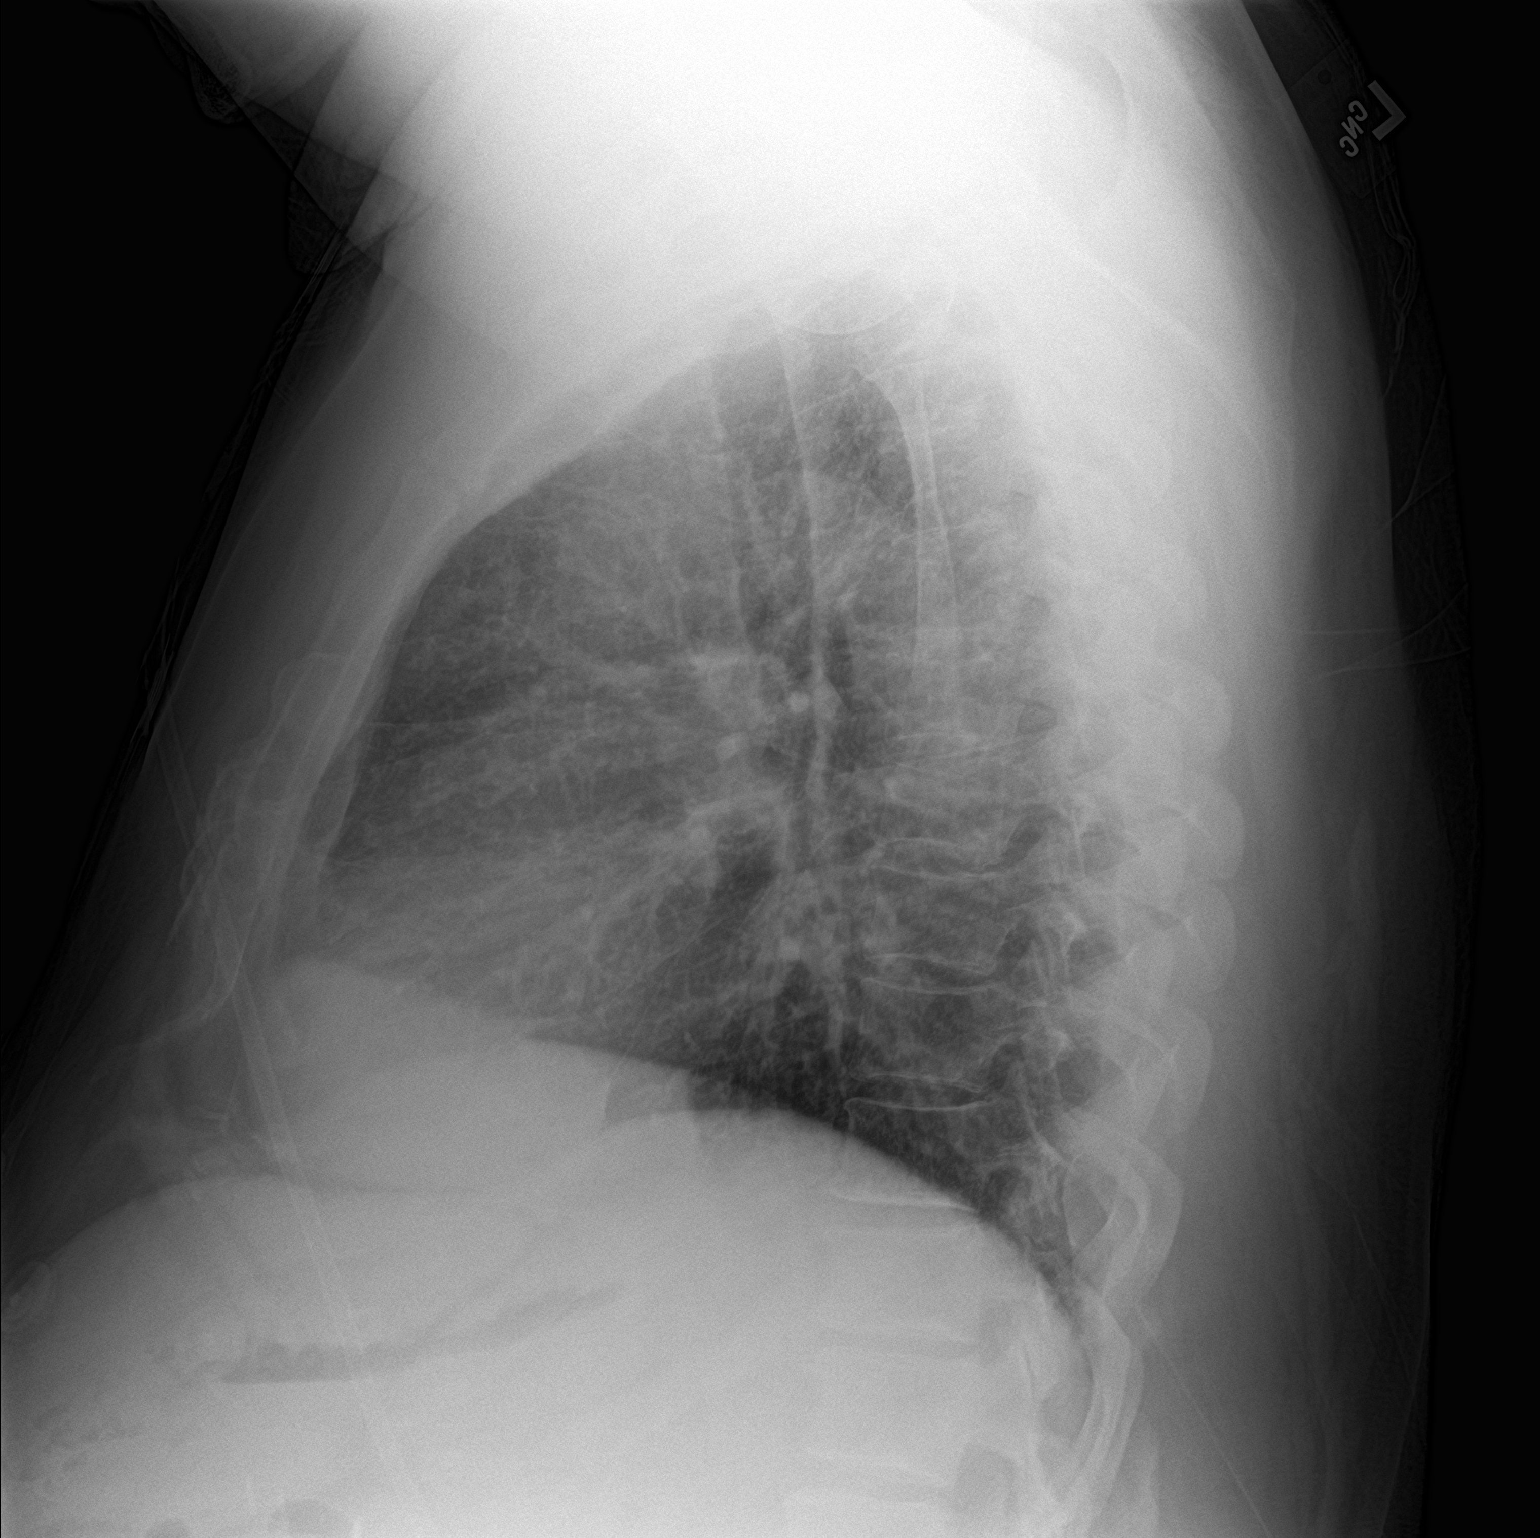

[2 of 2 positions shown; findings below may reference images not displayed]

FINDINGS: The cardiomediastinal silhouette is unchanged in contour. No pleural
effusion. No pneumothorax. Mild diffuse reticular prominence,
unchanged and likely reflecting chronic interstitial lung changes
due to reported COPD. Visualized abdomen is unremarkable. Mild
degenerative changes of the thoracic spine.
IMPRESSION: No acute cardiopulmonary abnormality.

## 2022-12-31 ENCOUNTER — Ambulatory Visit: Admit: 2022-12-31 | Discharge: 2022-12-31 | Payer: MEDICAID

## 2023-02-09 ENCOUNTER — Other Ambulatory Visit: Payer: Self-pay | Admitting: Family Medicine

## 2023-02-09 DIAGNOSIS — E1165 Type 2 diabetes mellitus with hyperglycemia: Secondary | ICD-10-CM

## 2023-02-16 ENCOUNTER — Ambulatory Visit
Admission: RE | Admit: 2023-02-16 | Discharge: 2023-02-16 | Disposition: A | Discharge status: Home / Self Care | Payer: Medicaid Other | Source: Ambulatory Visit | Attending: Family Medicine | Admitting: Family Medicine

## 2023-02-16 DIAGNOSIS — E1165 Type 2 diabetes mellitus with hyperglycemia: Secondary | ICD-10-CM | POA: Diagnosis present

## 2023-11-03 ENCOUNTER — Other Ambulatory Visit: Payer: Self-pay

## 2023-11-03 ENCOUNTER — Emergency Department: Payer: MEDICAID

## 2023-11-03 ENCOUNTER — Emergency Department
Admission: EM | Admit: 2023-11-03 | Discharge: 2023-11-03 | Disposition: A | Discharge status: Home / Self Care | Payer: MEDICAID | Attending: Student in an Organized Health Care Education/Training Program | Admitting: Student in an Organized Health Care Education/Training Program

## 2023-11-03 DIAGNOSIS — J449 Chronic obstructive pulmonary disease, unspecified: Secondary | ICD-10-CM | POA: Diagnosis not present

## 2023-11-03 DIAGNOSIS — J441 Chronic obstructive pulmonary disease with (acute) exacerbation: Secondary | ICD-10-CM | POA: Diagnosis not present

## 2023-11-03 DIAGNOSIS — I2510 Atherosclerotic heart disease of native coronary artery without angina pectoris: Secondary | ICD-10-CM | POA: Diagnosis not present

## 2023-11-03 DIAGNOSIS — I10 Essential (primary) hypertension: Secondary | ICD-10-CM | POA: Diagnosis not present

## 2023-11-03 DIAGNOSIS — R0602 Shortness of breath: Secondary | ICD-10-CM | POA: Diagnosis present

## 2023-11-03 DIAGNOSIS — Z1152 Encounter for screening for COVID-19: Secondary | ICD-10-CM | POA: Diagnosis not present

## 2023-11-03 LAB — CBC WITH DIFFERENTIAL/PLATELET
Abs Immature Granulocytes: 0.05 10*3/uL (ref 0.00–0.07)
Basophils Absolute: 0 10*3/uL (ref 0.0–0.1)
Basophils Relative: 0 %
Eosinophils Absolute: 0.2 10*3/uL (ref 0.0–0.5)
Eosinophils Relative: 1 %
HCT: 44.4 % (ref 39.0–52.0)
Hemoglobin: 14.2 g/dL (ref 13.0–17.0)
Immature Granulocytes: 0 %
Lymphocytes Relative: 11 %
Lymphs Abs: 1.6 10*3/uL (ref 0.7–4.0)
MCH: 28.2 pg (ref 26.0–34.0)
MCHC: 32 g/dL (ref 30.0–36.0)
MCV: 88.3 fL (ref 80.0–100.0)
Monocytes Absolute: 0.9 10*3/uL (ref 0.1–1.0)
Monocytes Relative: 6 %
Neutro Abs: 11.6 10*3/uL — ABNORMAL HIGH (ref 1.7–7.7)
Neutrophils Relative %: 82 %
Platelets: 239 10*3/uL (ref 150–400)
RBC: 5.03 MIL/uL (ref 4.22–5.81)
RDW: 12.8 % (ref 11.5–15.5)
WBC: 14.3 10*3/uL — ABNORMAL HIGH (ref 4.0–10.5)
nRBC: 0 % (ref 0.0–0.2)

## 2023-11-03 LAB — COMPREHENSIVE METABOLIC PANEL
ALT: 23 U/L (ref 0–44)
AST: 17 U/L (ref 15–41)
Albumin: 3.9 g/dL (ref 3.5–5.0)
Alkaline Phosphatase: 70 U/L (ref 38–126)
Anion gap: 9 (ref 5–15)
BUN: 18 mg/dL (ref 8–23)
CO2: 28 mmol/L (ref 22–32)
Calcium: 8.6 mg/dL — ABNORMAL LOW (ref 8.9–10.3)
Chloride: 97 mmol/L — ABNORMAL LOW (ref 98–111)
Creatinine, Ser: 0.74 mg/dL (ref 0.61–1.24)
GFR, Estimated: >60 mL/min (ref >60)
Glucose, Bld: 130 mg/dL — ABNORMAL HIGH (ref 70–99)
Potassium: 4 mmol/L (ref 3.5–5.1)
Sodium: 134 mmol/L — ABNORMAL LOW (ref 135–145)
Total Bilirubin: 0.9 mg/dL (ref <1.2)
Total Protein: 7.2 g/dL (ref 6.5–8.1)

## 2023-11-03 LAB — TROPONIN I (HIGH SENSITIVITY)
Troponin I (High Sensitivity): 5 ng/L (ref <18)
Troponin I (High Sensitivity): 6 ng/L (ref <18)

## 2023-11-03 LAB — SARS CORONAVIRUS 2 BY RT PCR: SARS Coronavirus 2 by RT PCR: NEGATIVE

## 2023-11-03 MED ORDER — PREDNISONE 10 MG PO TABS: 10.0000 mg | ORAL_TABLET | Freq: Every day | ORAL | 0 refills | Status: DC

## 2023-11-03 MED ORDER — DOXYCYCLINE HYCLATE 100 MG PO TABS: 100.0000 mg | ORAL_TABLET | Freq: Two times a day (BID) | ORAL | 0 refills | Status: AC

## 2023-11-03 MED ORDER — METHYLPREDNISOLONE SODIUM SUCC 125 MG IJ SOLR
125.0000 mg | Freq: Once | INTRAMUSCULAR | Status: AC
Start: 2023-11-03 — End: 2023-11-03
  Administered 2023-11-03: 125 mg via INTRAVENOUS
  Filled 2023-11-03: qty 2

## 2023-11-03 MED ORDER — ALBUTEROL SULFATE HFA 108 (90 BASE) MCG/ACT IN AERS: 2.0000 | INHALATION_SPRAY | Freq: Four times a day (QID) | RESPIRATORY_TRACT | 1 refills | Status: AC | PRN

## 2023-11-03 MED ORDER — IPRATROPIUM-ALBUTEROL 0.5-2.5 (3) MG/3ML IN SOLN
3.0000 mL | Freq: Once | RESPIRATORY_TRACT | Status: AC
  Administered 2023-11-03: 3 mL via RESPIRATORY_TRACT
  Filled 2023-11-03: qty 3

## 2023-11-03 NOTE — ED Provider Notes (Signed)
[facility] Provider Note    Event Date/Time   First MD Initiated Contact with Patient 11/03/23 [phone]     (approximate)   History   Shortness of Breath   HPI  Ian [name] is a 62 y.o. male history of COPD, CAD, hypertension presents to the ER for evaluation of several days of progressively worsening cough shortness of breath wheezing.  States he having frequent coughing fits no productive cough.  States he does typically wear oxygen at night but has been having to wear oxygen 24/7.  Feels like he will get brief relief from nebulizer treatment.  EMS was called and patient found to be hypoxic with EMS requiring 4 L nasal cannula.  Given nebulizer treatment with improvement in symptoms.     Physical Exam   Triage Vital Signs: ED Triage Vitals  Encounter Vitals Group     BP      Systolic BP Percentile      Diastolic BP Percentile      Pulse      Resp      Temp      Temp src      SpO2      Weight      Height      Head Circumference      Peak Flow      Pain Score      Pain Loc      Pain Education      Exclude from Growth Chart     Most recent vital signs: Vitals:   11/03/23 0922 11/03/23 1230  BP:  129/81  Pulse:  96  Resp:  (!) 24  Temp: 98.2 F (36.8 C)   SpO2:  92%     Constitutional: Alert  Eyes: Conjunctivae are normal.  Head: Atraumatic. Nose: No congestion/rhinnorhea. Mouth/Throat: Mucous membranes are moist.   Neck: Painless ROM.  Cardiovascular:   Good peripheral circulation. Respiratory: Mild tachypnea with pursed lip breathing and prolonged expiratory phase diffuse wheezing throughout.  Speaking in complete phrases. Gastrointestinal: Soft and nontender.  Musculoskeletal:  no deformity Neurologic:  MAE spontaneously. No gross focal neurologic deficits are appreciated.  Skin:  Skin is warm, dry and intact. No rash noted. Psychiatric: Mood and affect are normal. Speech and behavior are normal.    ED Results /  Procedures / Treatments   Labs (all labs ordered are listed, but only abnormal results are displayed) Labs Reviewed  CBC WITH DIFFERENTIAL/PLATELET - Abnormal; Notable for the following components:      Result Value   WBC 14.3 (*)    Neutro Abs 11.6 (*)    All other components within normal limits  COMPREHENSIVE METABOLIC PANEL - Abnormal; Notable for the following components:   Sodium 134 (*)    Chloride 97 (*)    Glucose, Bld 130 (*)    Calcium 8.6 (*)    All other components within normal limits  SARS CORONAVIRUS 2 BY RT PCR  TROPONIN I (HIGH SENSITIVITY)  TROPONIN I (HIGH SENSITIVITY)     EKG  ED ECG REPORT I, Willy Eddy, the attending physician, personally viewed and interpreted this ECG.   Date: 11/03/2023  EKG Time: 9:22  Rate: 90  Rhythm: sinus  Axis: right  Intervals: normal  ST&T Change: no stemi, no depression    RADIOLOGY Please see ED Course for my review and interpretation.  I personally reviewed all radiographic images ordered to evaluate for the above acute complaints and reviewed radiology reports and  findings.  These findings were personally discussed with the patient.  Please see medical record for radiology report.    PROCEDURES:  Critical Care performed: No  Procedures   MEDICATIONS ORDERED IN ED: Medications  methylPREDNISolone sodium succinate (SOLU-MEDROL) 125 mg/2 mL injection 125 mg (125 mg Intravenous Given 11/03/23 0935)  ipratropium-albuterol (DUONEB) 0.5-2.5 (3) MG/3ML nebulizer solution 3 mL (3 mLs Nebulization Given 11/03/23 0935)     IMPRESSION / MDM / ASSESSMENT AND PLAN / ED COURSE  I reviewed the triage vital signs and the nursing notes.                              Differential diagnosis includes, but is not limited to, Asthma, copd, CHF, pna, ptx, malignancy, Pe, anemia   Patient presenting to the ER for evaluation of symptoms as described above.  Based on symptoms, risk factors and considered above  differential, this presenting complaint could reflect a potentially life-threatening illness therefore the patient will be placed on continuous pulse oximetry and telemetry for monitoring.  Laboratory evaluation will be sent to evaluate for the above complaints.      Clinical Course as of 11/03/23 1340  Tue Nov 03, 2023  1610 Chest x-ray on my review and interpretation without evidence of consolidation or pneumothorax. [PR]  1128 Patient with significant improvement in symptoms.  Was initially thinking he was going to require hospitalization but given his rapid improvement will plan to observe for another hour to give additional nebulizer treatment and if not requiring oxygen would be appropriate for outpatient follow-up. [PR]    Clinical Course User Index [PR] Willy Eddy, MD     FINAL CLINICAL IMPRESSION(S) / ED DIAGNOSES   Final diagnoses:  COPD exacerbation (HCC)     Rx / DC Orders   ED Discharge Orders          Ordered    predniSONE (DELTASONE) 10 MG tablet  Daily        11/03/23 1338    albuterol (VENTOLIN HFA) 108 (90 Base) MCG/ACT inhaler  Every 6 hours PRN        11/03/23 1338    doxycycline (VIBRA-TABS) 100 MG tablet  2 times daily        11/03/23 1338             Note:  This document was prepared using Dragon voice recognition software and may include unintentional dictation errors.    Willy Eddy, MD 11/03/23 1340

## 2023-11-03 NOTE — ED Triage Notes (Addendum)
BIBEMS,  coming from home. C/o SOB x4 days. PMH: COPD 2L [place] baseline. Expiratory wheezing noted. Given 2 duo nebs with minimal relief. 90% on 2L [place]. GCS 15, ambulatory on scene

## 2023-11-24 ENCOUNTER — Encounter: Payer: Self-pay | Admitting: Gastroenterology

## 2023-12-28 ENCOUNTER — Ambulatory Visit
Admission: RE | Admit: 2023-12-28 | Discharge: 2023-12-28 | Disposition: A | Discharge status: Home / Self Care | Payer: MEDICAID | Source: Ambulatory Visit | Attending: Gastroenterology | Admitting: Gastroenterology

## 2023-12-28 ENCOUNTER — Other Ambulatory Visit: Payer: Self-pay

## 2023-12-28 ENCOUNTER — Encounter
Admission: RE | Disposition: A | Discharge status: Home / Self Care | Payer: Self-pay | Source: Ambulatory Visit | Attending: Gastroenterology

## 2023-12-28 ENCOUNTER — Ambulatory Visit: Payer: MEDICAID | Admitting: Anesthesiology

## 2023-12-28 ENCOUNTER — Encounter: Payer: Self-pay | Admitting: Gastroenterology

## 2023-12-28 DIAGNOSIS — Z79899 Other long term (current) drug therapy: Secondary | ICD-10-CM | POA: Diagnosis not present

## 2023-12-28 DIAGNOSIS — I2510 Atherosclerotic heart disease of native coronary artery without angina pectoris: Secondary | ICD-10-CM | POA: Diagnosis not present

## 2023-12-28 DIAGNOSIS — D175 Benign lipomatous neoplasm of intra-abdominal organs: Secondary | ICD-10-CM | POA: Diagnosis not present

## 2023-12-28 DIAGNOSIS — Z860101 Personal history of adenomatous and serrated colon polyps: Secondary | ICD-10-CM | POA: Insufficient documentation

## 2023-12-28 DIAGNOSIS — J449 Chronic obstructive pulmonary disease, unspecified: Secondary | ICD-10-CM | POA: Diagnosis not present

## 2023-12-28 DIAGNOSIS — Z8501 Personal history of malignant neoplasm of esophagus: Secondary | ICD-10-CM | POA: Diagnosis not present

## 2023-12-28 DIAGNOSIS — K2270 Barrett's esophagus without dysplasia: Secondary | ICD-10-CM | POA: Diagnosis not present

## 2023-12-28 DIAGNOSIS — D124 Benign neoplasm of descending colon: Secondary | ICD-10-CM | POA: Diagnosis not present

## 2023-12-28 DIAGNOSIS — D123 Benign neoplasm of transverse colon: Secondary | ICD-10-CM | POA: Diagnosis not present

## 2023-12-28 DIAGNOSIS — Z9049 Acquired absence of other specified parts of digestive tract: Secondary | ICD-10-CM | POA: Insufficient documentation

## 2023-12-28 DIAGNOSIS — K219 Gastro-esophageal reflux disease without esophagitis: Secondary | ICD-10-CM | POA: Insufficient documentation

## 2023-12-28 DIAGNOSIS — Z8673 Personal history of transient ischemic attack (TIA), and cerebral infarction without residual deficits: Secondary | ICD-10-CM | POA: Diagnosis not present

## 2023-12-28 DIAGNOSIS — D120 Benign neoplasm of cecum: Secondary | ICD-10-CM | POA: Diagnosis not present

## 2023-12-28 DIAGNOSIS — I509 Heart failure, unspecified: Secondary | ICD-10-CM | POA: Diagnosis not present

## 2023-12-28 DIAGNOSIS — F419 Anxiety disorder, unspecified: Secondary | ICD-10-CM | POA: Insufficient documentation

## 2023-12-28 DIAGNOSIS — Z7984 Long term (current) use of oral hypoglycemic drugs: Secondary | ICD-10-CM | POA: Diagnosis not present

## 2023-12-28 DIAGNOSIS — Z800 Family history of malignant neoplasm of digestive organs: Secondary | ICD-10-CM | POA: Diagnosis not present

## 2023-12-28 DIAGNOSIS — K2960 Other gastritis without bleeding: Secondary | ICD-10-CM | POA: Diagnosis not present

## 2023-12-28 DIAGNOSIS — F17210 Nicotine dependence, cigarettes, uncomplicated: Secondary | ICD-10-CM | POA: Insufficient documentation

## 2023-12-28 DIAGNOSIS — F319 Bipolar disorder, unspecified: Secondary | ICD-10-CM | POA: Insufficient documentation

## 2023-12-28 DIAGNOSIS — E119 Type 2 diabetes mellitus without complications: Secondary | ICD-10-CM | POA: Diagnosis not present

## 2023-12-28 DIAGNOSIS — I110 Hypertensive heart disease with heart failure: Secondary | ICD-10-CM | POA: Insufficient documentation

## 2023-12-28 DIAGNOSIS — D128 Benign neoplasm of rectum: Secondary | ICD-10-CM | POA: Insufficient documentation

## 2023-12-28 DIAGNOSIS — Z1211 Encounter for screening for malignant neoplasm of colon: Secondary | ICD-10-CM | POA: Insufficient documentation

## 2023-12-28 HISTORY — DX: Hyperlipidemia, unspecified: E78.5

## 2023-12-28 HISTORY — PX: HEMOSTASIS CLIP PLACEMENT: SHX6857

## 2023-12-28 HISTORY — DX: Chronic viral hepatitis C: B18.2

## 2023-12-28 HISTORY — PX: ESOPHAGOGASTRODUODENOSCOPY: SHX5428

## 2023-12-28 HISTORY — DX: Elevated white blood cell count, unspecified: D72.829

## 2023-12-28 HISTORY — DX: Bipolar disorder, unspecified: F31.9

## 2023-12-28 HISTORY — DX: Malignant (primary) neoplasm, unspecified: C80.1

## 2023-12-28 HISTORY — DX: Inflammatory liver disease, unspecified: K75.9

## 2023-12-28 HISTORY — PX: POLYPECTOMY: SHX5525

## 2023-12-28 HISTORY — DX: Type 2 diabetes mellitus without complications: E11.9

## 2023-12-28 HISTORY — PX: BIOPSY: SHX5522

## 2023-12-28 HISTORY — PX: COLONOSCOPY WITH PROPOFOL: SHX5780

## 2023-12-28 LAB — GLUCOSE, CAPILLARY: Glucose-Capillary: 118 mg/dL — ABNORMAL HIGH (ref 70–99)

## 2023-12-28 SURGERY — COLONOSCOPY WITH PROPOFOL
Anesthesia: General

## 2023-12-28 MED ORDER — PROPOFOL 1000 MG/100ML IV EMUL
INTRAVENOUS | Status: AC
  Filled 2023-12-28: qty 100

## 2023-12-28 MED ORDER — SODIUM CHLORIDE 0.9 % IV SOLN: INTRAVENOUS | Status: DC

## 2023-12-28 MED ORDER — LIDOCAINE HCL (CARDIAC) PF 100 MG/5ML IV SOSY
PREFILLED_SYRINGE | INTRAVENOUS | Status: DC | PRN
  Administered 2023-12-28: 100 mg via INTRAVENOUS

## 2023-12-28 MED ORDER — PROPOFOL 500 MG/50ML IV EMUL
INTRAVENOUS | Status: DC | PRN
  Administered 2023-12-28: 150 ug/kg/min via INTRAVENOUS

## 2023-12-28 MED ORDER — LIDOCAINE HCL (PF) 2 % IJ SOLN
INTRAMUSCULAR | Status: AC
  Filled 2023-12-28: qty 5

## 2023-12-28 MED ORDER — PROPOFOL 10 MG/ML IV BOLUS
INTRAVENOUS | Status: DC | PRN
  Administered 2023-12-28: 100 mg via INTRAVENOUS

## 2023-12-28 MED ORDER — PHENYLEPHRINE 80 MCG/ML (10ML) SYRINGE FOR IV PUSH (FOR BLOOD PRESSURE SUPPORT)
PREFILLED_SYRINGE | INTRAVENOUS | Status: DC | PRN
  Administered 2023-12-28 (×3): 50 ug via INTRAVENOUS

## 2023-12-28 MED ORDER — PHENYLEPHRINE 80 MCG/ML (10ML) SYRINGE FOR IV PUSH (FOR BLOOD PRESSURE SUPPORT)
PREFILLED_SYRINGE | INTRAVENOUS | Status: AC
  Filled 2023-12-28: qty 10

## 2023-12-28 NOTE — Op Note (Addendum)
 [facility] Gastroenterology Patient Name: Ian [name] Procedure Date: 12/28/2023 9:40 AM MRN: [id number] Account #: [id number] Date of Birth: 06-06-1961 Admit Type: Outpatient Age: 63 Room: [facility] ENDO ROOM 2 Gender: Male Note Status: Supervisor Override Instrument Name: [id number] Procedure:             Colonoscopy Indications:           Screening for colorectal malignant neoplasm Providers:             Elspeth Ozell Onita ROSALEA, DO Referring MD:          [name] Medicines:             Monitored Anesthesia Care Complications:         No immediate complications. Estimated blood loss:                         Minimal. Procedure:             Pre-Anesthesia Assessment:                        - Prior to the procedure, a History and Physical was                         performed, and patient medications and allergies were                         reviewed. The patient is competent. The risks and                         benefits of the procedure and the sedation options and                         risks were discussed with the patient. All questions                         were answered and informed consent was obtained.                         Patient identification and proposed procedure were                         verified by the physician, the nurse, the anesthetist                         and the technician in the endoscopy suite. Mental                         Status Examination: alert and oriented. Airway                         Examination: normal oropharyngeal airway and neck                         mobility. Respiratory Examination: clear to                         auscultation. CV Examination: RRR, no murmurs, no S3  or S4. Prophylactic Antibiotics: The patient does not                         require prophylactic antibiotics. Prior                         Anticoagulants: The patient has taken no anticoagulant                          or antiplatelet agents. ASA Grade Assessment: III - A                         patient with severe systemic disease. After reviewing                         the risks and benefits, the patient was deemed in                         satisfactory condition to undergo the procedure. The                         anesthesia plan was to use monitored anesthesia care                         (MAC). Immediately prior to administration of                         medications, the patient was re-assessed for adequacy                         to receive sedatives. The heart rate, respiratory                         rate, oxygen saturations, blood pressure, adequacy of                         pulmonary ventilation, and response to care were                         monitored throughout the procedure. The physical                         status of the patient was re-assessed after the                         procedure.                        After obtaining informed consent, the colonoscope was                         passed under direct vision. Throughout the procedure,                         the patient's blood pressure, pulse, and oxygen                         saturations were monitored continuously. The  Colonoscope was introduced through the anus and                         advanced to the the cecum, identified by appendiceal                         orifice and ileocecal valve. The colonoscopy was                         performed without difficulty. The patient tolerated                         the procedure well. The quality of the bowel                         preparation was inadequate. The ileocecal valve,                         appendiceal orifice, and rectum were photographed. Findings:      The perianal and digital rectal examinations were normal. Pertinent       negatives include normal sphincter tone.      Extensive amounts of semi-liquid semi-solid stool was  found in the       entire colon, interfering with visualization. Lavage of the area was       performed, resulting in incomplete clearance with fair visualization.       Estimated blood loss: none.      There was a medium-sized lipoma, in the proximal ascending colon.       Estimated blood loss: none.      Normal mucosa was found in the entire colon. of what could be visualzed       given prep limitations. Biopsies for histology were taken with a cold       forceps from the right colon and left colon for evaluation of       microscopic colitis. Estimated blood loss was minimal.      Two sessile polyps were found in the transverse colon. The polyps were 1       to 2 mm in size. These polyps were removed with a jumbo cold forceps.       Resection and retrieval were complete. Estimated blood loss was minimal.      A 3 to 4 mm polyp was found in the cecum. The polyp was sessile. The       polyp was removed with a cold snare. Resection was complete, but the       polyp tissue was not retrieved. Estimated blood loss was minimal.      A 4 to 5 mm polyp was found in the descending colon. The polyp was       sessile. The polyp was removed with a cold snare. Resection and       retrieval were complete. To prevent bleeding after the polypectomy, one       hemostatic clip was successfully placed (MR conditional). There was no       bleeding at the end of the procedure. Estimated blood loss was minimal.      Nine sessile polyps were found in the rectum (2), descending colon (4)       and transverse colon (3). The polyps were 3 to 6 mm in size. These  polyps were removed with a cold snare. Resection and retrieval were       complete. Estimated blood loss was minimal.      The exam was otherwise without abnormality on direct and retroflexion       views. Impression:            - Preparation of the colon was inadequate.                        - Stool in the entire examined colon.                         - Medium-sized lipoma in the proximal ascending colon.                        - Normal mucosa in the entire examined colon. Biopsied.                        - Two 1 to 2 mm polyps in the transverse colon,                         removed with a jumbo cold forceps. Resected and                         retrieved.                        - One 3 to 4 mm polyp in the cecum, removed with a                         cold snare. Complete resection. Polyp tissue not                         retrieved.                        - One 4 to 5 mm polyp in the descending colon, removed                         with a cold snare. Resected and retrieved. Clip (MR                         conditional) was placed.                        - Nine 3 to 6 mm polyps in the rectum, in the                         descending colon and in the transverse colon, removed                         with a cold snare. Resected and retrieved.                        - The examination was otherwise normal on direct and                         retroflexion views. Recommendation:        -  Patient has a contact number available for                         emergencies. The signs and symptoms of potential                         delayed complications were discussed with the patient.                         Return to normal activities tomorrow. Written                         discharge instructions were provided to the patient.                        - Discharge patient to home.                        - Resume previous diet.                        - Continue present medications.                        - No aspirin , ibuprofen , naproxen, or other                         non-steroidal anti-inflammatory drugs for 5 days after                         polyp removal.                        - Await pathology results.                        - Repeat colonoscopy 6-12 months because the bowel                         preparation was poor.                         - Return to GI office as previously scheduled.                        - Will need improved prep for next exam                        - The findings and recommendations were discussed with                         the patient's family.                        - The findings and recommendations were discussed with                         the patient. Procedure Code(s):     --- Professional ---                        [phone], Colonoscopy, flexible; with removal of  tumor(s), polyp(s), or other lesion(s) by snare                         technique                        [id number], 59, Colonoscopy, flexible; with biopsy, single                         or multiple Diagnosis Code(s):     --- Professional ---                        Z86.010, Personal history of colonic polyps                        D17.5, Benign lipomatous neoplasm of intra-abdominal                         organs                        D12.8, Benign neoplasm of rectum                        D12.4, Benign neoplasm of descending colon                        D12.3, Benign neoplasm of transverse colon (hepatic                         flexure or splenic flexure)                        D12.0, Benign neoplasm of cecum CPT copyright 2022 [employer]. All rights reserved. The codes documented in this report are preliminary and upon coder review may  be revised to meet current compliance requirements. Attending Participation:      I personally performed the entire procedure. Elspeth Jungling, DO [name] DO, DO 12/28/2023 10:55:17 AM This report has been signed electronically. Number of Addenda: 0 Note Initiated On: 12/28/2023 9:40 AM Scope Withdrawal Time: 0 hours 34 minutes 37 seconds  Total Procedure Duration: 0 hours 38 minutes 27 seconds  Estimated Blood Loss:  Estimated blood loss was minimal.      [facility]

## 2023-12-28 NOTE — Anesthesia Preprocedure Evaluation (Signed)
 Anesthesia Evaluation  Patient identified by MRN, date of birth, ID band Patient awake    Reviewed: Allergy & Precautions, NPO status , Patient's Chart, lab work & pertinent test results  Airway Mallampati: III  TM Distance: >3 FB Neck ROM: full    Dental  (+) Chipped, Dental Advidsory Given   Pulmonary sleep apnea and Oxygen sleep apnea , COPD, Current Smoker   Pulmonary exam normal        Cardiovascular hypertension, + CAD and +CHF  Normal cardiovascular exam     Neuro/Psych  PSYCHIATRIC DISORDERS Anxiety Depression Bipolar Disorder   TIA   GI/Hepatic Neg liver ROS,GERD  Medicated and Controlled,,  Endo/Other  negative endocrine ROSdiabetes    Renal/GU negative Renal ROS  negative genitourinary   Musculoskeletal   Abdominal   Peds  Hematology negative hematology ROS (+)   Anesthesia Other Findings Past Medical History: No date: Anxiety No date: Barrett esophagus No date: Bipolar disorder (HCC) No date: Cancer (HCC) No date: Chronic hepatitis C (HCC) No date: COPD (chronic obstructive pulmonary disease) (HCC) No date: Coronary artery disease No date: Depression No date: Diabetes mellitus without complication (HCC) No date: Dyslipidemia No date: GERD (gastroesophageal reflux disease) No date: Hepatitis No date: Hypertension No date: Leukocytosis No date: Pre-diabetes No date: Primary malignant neoplasm (HCC) 2014: Stroke (HCC)     Comment:  mini-stroke per patient  Past Surgical History: No date: ANGIOPLASTY No date: APPENDECTOMY No date: CARDIAC CATHETERIZATION No date: CHOLECYSTECTOMY No date: CORONARY ANGIOPLASTY No date: WRIST SURGERY; Right     Comment:  age 88  BMI    Body Mass Index: 37.00 kg/m      Reproductive/Obstetrics negative OB ROS                             Anesthesia Physical Anesthesia Plan  ASA: 3  Anesthesia Plan: General   Post-op Pain  Management: Minimal or no pain anticipated   Induction: Intravenous  PONV Risk Score and Plan: 2 and Propofol  infusion, TIVA and Ondansetron   Airway Management Planned: Nasal Cannula  Additional Equipment: None  Intra-op Plan:   Post-operative Plan:   Informed Consent: I have reviewed the patients History and Physical, chart, labs and discussed the procedure including the risks, benefits and alternatives for the proposed anesthesia with the patient or authorized representative who has indicated his/her understanding and acceptance.     Dental advisory given  Plan Discussed with: CRNA and Surgeon  Anesthesia Plan Comments: (Discussed risks of anesthesia with patient, including possibility of difficulty with spontaneous ventilation under anesthesia necessitating airway intervention, PONV, and rare risks such as cardiac or respiratory or neurological events, and allergic reactions. Discussed the role of CRNA in patient's perioperative care. Patient understands.)       Anesthesia Quick Evaluation

## 2023-12-28 NOTE — Interval H&P Note (Signed)
 History and Physical Interval Note: Preprocedure H&P from 12/28/23  was reviewed and there was no interval change after seeing and examining the patient.  Written consent was obtained from the patient after discussion of risks, benefits, and alternatives. Patient has consented to proceed with Esophagogastroduodenoscopy and Colonoscopy with possible intervention   12/28/2023 9:48 AM  Ian [name]  has presented today for surgery, with the diagnosis of V76.51 (ICD-9-CM) - Z12.11 (ICD-10-CM) - Colon cancer screening,HISTORY ESOPHAGEAL CANCER.  The various methods of treatment have been discussed with the patient and family. After consideration of risks, benefits and other options for treatment, the patient has consented to  Procedure(s): COLONOSCOPY WITH PROPOFOL  (N/A) ESOPHAGOGASTRODUODENOSCOPY (EGD) (N/A) as a surgical intervention.  The patient's history has been reviewed, patient examined, no change in status, stable for surgery.  I have reviewed the patient's chart and labs.  Questions were answered to the patient's satisfaction.     [name]

## 2023-12-28 NOTE — Transfer of Care (Signed)
 Immediate Anesthesia Transfer of Care Note  Patient: Ian [name]  Procedure(s) Performed: COLONOSCOPY WITH PROPOFOL  ESOPHAGOGASTRODUODENOSCOPY (EGD) BIOPSY POLYPECTOMY  Patient Location: PACU  Anesthesia Type:General  Level of Consciousness: awake and sedated  Airway & Oxygen Therapy: Patient Spontanous Breathing and Patient connected to face mask oxygen  Post-op Assessment: Report given to RN and Post -op Vital signs reviewed and stable  Post vital signs: Reviewed and stable  Last Vitals:  Vitals Value Taken Time  BP    Temp    Pulse    Resp    SpO2      Last Pain:  Vitals:   12/28/23 0912  TempSrc: Temporal  PainSc: 0-No pain         Complications: There were no known notable events for this encounter.

## 2023-12-28 NOTE — Anesthesia Postprocedure Evaluation (Signed)
 Anesthesia Post Note  Patient: Ian [name]  Procedure(s) Performed: COLONOSCOPY WITH PROPOFOL  ESOPHAGOGASTRODUODENOSCOPY (EGD) BIOPSY POLYPECTOMY HEMOSTASIS CLIP PLACEMENT  Patient location during evaluation: Endoscopy Anesthesia Type: General Level of consciousness: awake and alert Pain management: pain level controlled Vital Signs Assessment: post-procedure vital signs reviewed and stable Respiratory status: spontaneous breathing, nonlabored ventilation, respiratory function stable and patient connected to nasal cannula oxygen Cardiovascular status: blood pressure returned to baseline and stable Postop Assessment: no apparent nausea or vomiting Anesthetic complications: no  There were no known notable events for this encounter.   Last Vitals:  Vitals:   12/28/23 1057 12/28/23 1107  BP: (!) 104/55 131/79  Pulse: 84 81  Resp: 20 12  Temp:    SpO2: 100% 95%    Last Pain:  Vitals:   12/28/23 1107  TempSrc:   PainSc: 0-No pain                 Debby [place]

## 2023-12-28 NOTE — H&P (Signed)
 Pre-Procedure H&P   Patient ID: Ian [name] is a 63 y.o. male.  Gastroenterology Provider: [name], DO  Referring Provider: Romero Antigua, PA PCP: [name], [place]  Date: 12/28/2023  HPI [name] is a 63 y.o. male who presents today for Esophagogastroduodenoscopy and Colonoscopy for Personal history of esophagus cancer surveillance post treatment; colorectal cancer screening . 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  He is here to undergo his yearly surveillance.  Bowels have been loose but no melena or hematochezia.  He notes his bowels are worse when he eats poorly or drinks a large amount of caffeine.  He is status post cholecystectomy and appendectomy  Has had dysphagia in the past, but not currently noting any symptoms.  He has undergone dilation in the past without improvement.  Last colonoscopy 2013 with 1 adenomatous polyp and limited prep  Father- crc in his 2s    Past Medical History:  Diagnosis Date   Anxiety    Barrett esophagus    Bipolar disorder (HCC)    Cancer (HCC)    Chronic hepatitis C (HCC)    COPD (chronic obstructive pulmonary disease) (HCC)    Coronary artery disease    Depression    Diabetes mellitus without complication (HCC)    Dyslipidemia    GERD (gastroesophageal reflux disease)    Hepatitis    Hypertension    Leukocytosis    Pre-diabetes    Primary malignant neoplasm (HCC)    Stroke (HCC) 2014   mini-stroke per patient    Past Surgical History:  Procedure Laterality Date   ANGIOPLASTY     APPENDECTOMY     CARDIAC CATHETERIZATION     CHOLECYSTECTOMY     CORONARY ANGIOPLASTY     WRIST SURGERY Right    age 7    Family History Father- crc in his 2s No other h/o GI disease or malignancy  Review of Systems  Constitutional:  Negative for activity change, appetite change, chills, diaphoresis, fatigue, fever and unexpected weight change.  HENT:  Negative  for trouble swallowing and voice change.   Respiratory:  Negative for shortness of breath and wheezing.   Cardiovascular:  Negative for chest pain, palpitations and leg swelling.  Gastrointestinal:  Positive for diarrhea. Negative for abdominal distention, abdominal pain, anal bleeding, blood in stool, constipation, nausea and vomiting.  Musculoskeletal:  Negative for arthralgias and myalgias.  Skin:  Negative for color change and pallor.  Neurological:  Negative for dizziness, syncope and weakness.  Psychiatric/Behavioral:  Negative for confusion. The patient is not nervous/anxious.   All other systems reviewed and are negative.    Medications No current facility-administered medications on file prior to encounter.   Current Outpatient Medications on File Prior to Encounter  Medication Sig Dispense Refill   amLODipine  (NORVASC ) 10 MG tablet Take 10 mg by mouth daily.     atorvastatin  (LIPITOR) 40 MG tablet Take 1 tablet (40 mg total) by mouth daily. 30 tablet 1   busPIRone  (BUSPAR ) 10 MG tablet Take 2 tablets (20 mg total) by mouth 3 (three) times daily. 180 tablet 1   clonazePAM  (KLONOPIN ) 0.5 MG tablet Take 1 tablet (0.5 mg total) by mouth 2 (two) times daily as needed (Anxiety). 60 tablet 1   cyclobenzaprine  (FLEXERIL ) 10 MG tablet Take 1 tablet (10 mg total) by mouth 2 (two) times daily as needed for muscle spasms. 20 tablet 0   escitalopram  (LEXAPRO ) 20 MG tablet Take  1 tablet (20 mg total) by mouth daily. 30 tablet 1   famotidine  (PEPCID ) 20 MG tablet Take 20 mg by mouth 2 (two) times daily.     fluticasone-salmeterol (ADVAIR ) 500-50 MCG/ACT AEPB Inhale 1 puff into the lungs in the morning and at bedtime.     lisinopril  (ZESTRIL ) 20 MG tablet Take 1 tablet (20 mg total) by mouth daily. 30 tablet 1   magnesium  oxide (MAG-OX) 400 (240 Mg) MG tablet Take 400 mg by mouth 2 (two) times daily. TAKE 4 TABLETS BY MOUTH 2 TIMES DAILY     metFORMIN  (GLUCOPHAGE ) 1000 MG tablet Take 1 tablet  (1,000 mg total) by mouth 2 (two) times daily with a meal. 60 tablet 1   pantoprazole  (PROTONIX ) 40 MG tablet Take 1 tablet (40 mg total) by mouth daily. 30 tablet 1   pramipexole (MIRAPEX) 0.5 MG tablet Take 0.5 mg by mouth at bedtime.     QUEtiapine  (SEROQUEL  XR) 400 MG 24 hr tablet Take 1 tablet (400 mg total) by mouth at bedtime. 30 tablet 1   tiotropium (SPIRIVA) 18 MCG inhalation capsule Place 18 mcg into inhaler and inhale daily.     zolpidem  (AMBIEN ) 10 MG tablet Take 1 tablet (10 mg total) by mouth at bedtime as needed for sleep. 30 tablet 1   aspirin  81 MG chewable tablet Chew 1 tablet (81 mg total) by mouth daily. (Patient not taking: Reported on 12/28/2023) 30 tablet 1   hydrOXYzine  (ATARAX /VISTARIL ) 25 MG tablet Take 1 tablet (25 mg total) by mouth 3 (three) times daily as needed for anxiety. (Patient not taking: Reported on 10/06/2022) 90 tablet 1   mometasone -formoterol  (DULERA ) 200-5 MCG/ACT AERO Inhale 2 puffs into the lungs 2 (two) times daily. (Patient not taking: Reported on 06/06/2021) 1 each 1   rOPINIRole (REQUIP XL) 2 MG 24 hr tablet Take by mouth.      Pertinent medications related to GI and procedure were reviewed by me with the patient prior to the procedure   Current Facility-Administered Medications:    0.9 %  sodium chloride  infusion, , Intravenous, Continuous, Onita Elspeth Sharper, DO, Last Rate: 20 mL/hr at 12/28/23 0924, New Bag at 12/28/23 9075  sodium chloride  20 mL/hr at 12/28/23 9075       Allergies  Allergen Reactions   Fluoxetine Swelling    Anxiety, itching   Hydrocodone -Acetaminophen  Itching   Mirtazapine  Other (See Comments)    nightmares   Hydrochlorothiazide  Other (See Comments)    LEG CRAMPING HYPOMAGNESEMIA   Tramadol Other (See Comments)   Gabapentin  Other (See Comments)    Nightmares    Allergies were reviewed by me prior to the procedure  Objective   Body mass index is 37 kg/m. Vitals:   12/28/23 0912  BP: 137/78  Pulse: 82   Resp: 20  Temp: (!) 97.1 F (36.2 C)  TempSrc: Temporal  SpO2: 94%  Weight: 123.7 kg  Height: 6' (1.829 m)     Physical Exam Vitals and nursing note reviewed.  Constitutional:      General: He is not in acute distress.    Appearance: Normal appearance. He is obese. He is not ill-appearing, toxic-appearing or diaphoretic.  HENT:     Head: Normocephalic and atraumatic.     Nose: Nose normal.     Mouth/Throat:     Mouth: Mucous membranes are moist.     Pharynx: Oropharynx is clear.  Eyes:     General: No scleral icterus.    Extraocular Movements: Extraocular  movements intact.  Cardiovascular:     Rate and Rhythm: Normal rate and regular rhythm.     Heart sounds: Normal heart sounds. No murmur heard.    No friction rub. No gallop.  Pulmonary:     Effort: Pulmonary effort is normal. No respiratory distress.     Breath sounds: Normal breath sounds. No wheezing, rhonchi or rales.  Abdominal:     General: Bowel sounds are normal. There is no distension.     Palpations: Abdomen is soft.     Tenderness: There is no abdominal tenderness. There is no guarding or rebound.  Musculoskeletal:     Cervical back: Neck supple.     Right lower leg: No edema.     Left lower leg: No edema.  Skin:    General: Skin is warm and dry.     Coloration: Skin is not jaundiced or pale.  Neurological:     General: No focal deficit present.     Mental Status: He is alert and oriented to person, place, and time. Mental status is at baseline.  Psychiatric:        Mood and Affect: Mood normal.        Behavior: Behavior normal.        Thought Content: Thought content normal.        Judgment: Judgment normal.      Assessment:  [name] is a 63 y.o. male  who presents today for Esophagogastroduodenoscopy and Colonoscopy for Personal history of esophagus cancer surveillance post treatment; colorectal cancer screening .  Plan:  Esophagogastroduodenoscopy and Colonoscopy with possible  intervention today  Esophagogastroduodenoscopy and Colonoscopy with possible biopsy, control of bleeding, polypectomy, and interventions as necessary has been discussed with the patient/patient representative. Informed consent was obtained from the patient/patient representative after explaining the indication, nature, and risks of the procedure including but not limited to death, bleeding, perforation, missed neoplasm/lesions, cardiorespiratory compromise, and reaction to medications. Opportunity for questions was given and appropriate answers were provided. Patient/patient representative has verbalized understanding is amenable to undergoing the procedure.   [name], DO  [facility] Gastroenterology  Portions of the record may have been created with voice recognition software. Occasional wrong-word or 'sound-a-like' substitutions may have occurred due to the inherent limitations of voice recognition software.  Read the chart carefully and recognize, using context, where substitutions may have occurred.

## 2023-12-28 NOTE — Op Note (Addendum)
 [facility] Gastroenterology Patient Name: Ian [name] Procedure Date: 12/28/2023 9:41 AM MRN: [id number] Account #: [id number] Date of Birth: [birth year] Admit Type: Outpatient Age: 63 Room: [facility] ENDO ROOM 2 Gender: Male Note Status: Finalized Instrument Name: Upper Endoscope [id number] Procedure:             Upper GI endoscopy Indications:           Suspected gastro-esophageal reflux disease, Follow-up                         of Barrett's esophagus, Hiatal Hernia Providers:             Elspeth Ozell Onita ROSALEA, DO Referring MD:          Sharrie Perkins Medicines:             Monitored Anesthesia Care Complications:         No immediate complications. Estimated blood loss:                         Minimal. Procedure:             Pre-Anesthesia Assessment:                        - Prior to the procedure, a History and Physical was                         performed, and patient medications and allergies were                         reviewed. The patient is competent. The risks and                         benefits of the procedure and the sedation options and                         risks were discussed with the patient. All questions                         were answered and informed consent was obtained.                         Patient identification and proposed procedure were                         verified by the physician, the nurse, the anesthetist                         and the technician in the endoscopy suite. Mental                         Status Examination: alert and oriented. Airway                         Examination: normal oropharyngeal airway and neck                         mobility. Respiratory Examination: clear to  auscultation. CV Examination: RRR, no murmurs, no S3                         or S4. Prophylactic Antibiotics: The patient does not                         require prophylactic antibiotics. Prior                          Anticoagulants: The patient has taken no anticoagulant                         or antiplatelet agents. ASA Grade Assessment: III - A                         patient with severe systemic disease. After reviewing                         the risks and benefits, the patient was deemed in                         satisfactory condition to undergo the procedure. The                         anesthesia plan was to use monitored anesthesia care                         (MAC). Immediately prior to administration of                         medications, the patient was re-assessed for adequacy                         to receive sedatives. The heart rate, respiratory                         rate, oxygen saturations, blood pressure, adequacy of                         pulmonary ventilation, and response to care were                         monitored throughout the procedure. The physical                         status of the patient was re-assessed after the                         procedure.                        After obtaining informed consent, the endoscope was                         passed under direct vision. Throughout the procedure,                         the patient's blood pressure, pulse, and oxygen  saturations were monitored continuously. The Endoscope                         was introduced through the mouth, and advanced to the                         second part of duodenum. The upper GI endoscopy was                         accomplished without difficulty. The patient tolerated                         the procedure well. Findings:      The ampulla, duodenal bulb, first portion of the duodenum and second       portion of the duodenum were normal. Biopsies for histology were taken       with a cold forceps for evaluation of celiac disease. Estimated blood       loss was minimal.      Localized mild inflammation characterized by adherent blood and erythema        was found in the gastric antrum. Biopsies were taken with a cold forceps       for Helicobacter pylori testing. Estimated blood loss was minimal.      The exam of the stomach was otherwise normal.      There were esophageal mucosal changes classified as Barrett's stage       C0-M1 per Prague criteria present in the distal esophagus. The maximum       longitudinal extent of these mucosal changes was 1 cm in length.       Biopsies were taken with a cold forceps for histology. Given history of       esophageal cancer, biopsies taken in 4 quadrant fashion including of the       tongue of salmon colored mucosa. Biopsies also taken from high cardia.       placed in two separate jars. Estimated blood loss was minimal. Imaging       was performed using white light and narrow band imaging to visualize the       mucosa. No signs of nodularity or lesion with nbi      Esophagogastric landmarks were identified: the gastroesophageal junction       was found at 46 cm from the incisors.      The exam of the esophagus was otherwise normal. Impression:            - Normal ampulla, duodenal bulb, first portion of the                         duodenum and second portion of the duodenum. Biopsied.                        - Gastritis. Biopsied.                        - Esophageal mucosal changes classified as Barrett's                         stage C0-M1 per Prague criteria. Biopsied.                        -  Esophagogastric landmarks identified. Recommendation:        - Patient has a contact number available for                         emergencies. The signs and symptoms of potential                         delayed complications were discussed with the patient.                         Return to normal activities tomorrow. Written                         discharge instructions were provided to the patient.                        - Discharge patient to home.                        - Resume previous diet.                         - Continue present medications.                        - Await pathology results.                        - Repeat upper endoscopy in 1 year for surveillance.                        - Return to GI office as previously scheduled.                        - The findings and recommendations were discussed with                         the patient.                        - The findings and recommendations were discussed with                         the patient's family. Procedure Code(s):     --- Professional ---                        [phone], Esophagogastroduodenoscopy, flexible,                         transoral; with biopsy, single or multiple Diagnosis Code(s):     --- Professional ---                        K22.70, Barrett's esophagus without dysplasia                        K29.70, Gastritis, unspecified, without bleeding                        Z85.01, Personal history of malignant neoplasm of  esophagus CPT copyright 2022 American Medical Association. All rights reserved. The codes documented in this report are preliminary and upon coder review may  be revised to meet current compliance requirements. Attending Participation:      I personally performed the entire procedure. Elspeth Jungling, DO [name] DO, DO 12/28/2023 10:09:10 AM This report has been signed electronically. Number of Addenda: 0 Note Initiated On: 12/28/2023 9:41 AM Estimated Blood Loss:  Estimated blood loss was minimal.      [facility]

## 2023-12-29 ENCOUNTER — Encounter: Payer: Self-pay | Admitting: Gastroenterology

## 2023-12-29 LAB — SURGICAL PATHOLOGY

## 2024-05-24 ENCOUNTER — Other Ambulatory Visit: Payer: Self-pay

## 2024-05-24 ENCOUNTER — Emergency Department: Payer: MEDICAID

## 2024-05-24 ENCOUNTER — Encounter: Payer: Self-pay | Admitting: Emergency Medicine

## 2024-05-24 ENCOUNTER — Emergency Department
Admission: EM | Admit: 2024-05-24 | Discharge: 2024-05-24 | Disposition: A | Discharge status: Home / Self Care | Payer: MEDICAID

## 2024-05-24 DIAGNOSIS — Z8673 Personal history of transient ischemic attack (TIA), and cerebral infarction without residual deficits: Secondary | ICD-10-CM | POA: Insufficient documentation

## 2024-05-24 DIAGNOSIS — R935 Abnormal findings on diagnostic imaging of other abdominal regions, including retroperitoneum: Secondary | ICD-10-CM | POA: Insufficient documentation

## 2024-05-24 DIAGNOSIS — I10 Essential (primary) hypertension: Secondary | ICD-10-CM | POA: Diagnosis not present

## 2024-05-24 DIAGNOSIS — J449 Chronic obstructive pulmonary disease, unspecified: Secondary | ICD-10-CM | POA: Insufficient documentation

## 2024-05-24 DIAGNOSIS — R062 Wheezing: Secondary | ICD-10-CM | POA: Diagnosis not present

## 2024-05-24 DIAGNOSIS — R0789 Other chest pain: Secondary | ICD-10-CM | POA: Diagnosis not present

## 2024-05-24 DIAGNOSIS — R1013 Epigastric pain: Secondary | ICD-10-CM | POA: Diagnosis present

## 2024-05-24 DIAGNOSIS — R1012 Left upper quadrant pain: Secondary | ICD-10-CM | POA: Insufficient documentation

## 2024-05-24 DIAGNOSIS — E119 Type 2 diabetes mellitus without complications: Secondary | ICD-10-CM | POA: Insufficient documentation

## 2024-05-24 LAB — BASIC METABOLIC PANEL WITH GFR
Anion gap: 10 (ref 5–15)
BUN: 12 mg/dL (ref 8–23)
CO2: 25 mmol/L (ref 22–32)
Calcium: 8.9 mg/dL (ref 8.9–10.3)
Chloride: 101 mmol/L (ref 98–111)
Creatinine, Ser: 0.85 mg/dL (ref 0.61–1.24)
GFR, Estimated: >60 mL/min (ref >60)
Glucose, Bld: 105 mg/dL — ABNORMAL HIGH (ref 70–99)
Potassium: 4.1 mmol/L (ref 3.5–5.1)
Sodium: 136 mmol/L (ref 135–145)

## 2024-05-24 LAB — HEPATIC FUNCTION PANEL
ALT: 16 U/L (ref 0–44)
AST: 16 U/L (ref 15–41)
Albumin: 4 g/dL (ref 3.5–5.0)
Alkaline Phosphatase: 69 U/L (ref 38–126)
Bilirubin, Direct: 0.1 mg/dL (ref 0.0–0.2)
Indirect Bilirubin: 0.7 mg/dL (ref 0.3–0.9)
Total Bilirubin: 0.8 mg/dL (ref 0.0–1.2)
Total Protein: 6.8 g/dL (ref 6.5–8.1)

## 2024-05-24 LAB — CBC
HCT: 47.9 % (ref 39.0–52.0)
Hemoglobin: 15 g/dL (ref 13.0–17.0)
MCH: 26.7 pg (ref 26.0–34.0)
MCHC: 31.3 g/dL (ref 30.0–36.0)
MCV: 85.2 fL (ref 80.0–100.0)
Platelets: 236 10*3/uL (ref 150–400)
RBC: 5.62 MIL/uL (ref 4.22–5.81)
RDW: 13.6 % (ref 11.5–15.5)
WBC: 13.1 10*3/uL — ABNORMAL HIGH (ref 4.0–10.5)
nRBC: 0 % (ref 0.0–0.2)

## 2024-05-24 LAB — TROPONIN I (HIGH SENSITIVITY): Troponin I (High Sensitivity): 4 ng/L (ref <18)

## 2024-05-24 LAB — LIPASE, BLOOD: Lipase: 28 U/L (ref 11–51)

## 2024-05-24 MED ORDER — ACETAMINOPHEN 500 MG PO TABS: 1000.0000 mg | ORAL_TABLET | Freq: Four times a day (QID) | ORAL | 2 refills | Status: DC | PRN

## 2024-05-24 MED ORDER — ALUM & MAG HYDROXIDE-SIMETH 200-200-20 MG/5ML PO SUSP
30.0000 mL | Freq: Once | ORAL | Status: AC
  Administered 2024-05-24: 30 mL via ORAL
  Filled 2024-05-24: qty 30

## 2024-05-24 MED ORDER — IOHEXOL 300 MG/ML  SOLN
100.0000 mL | Freq: Once | INTRAMUSCULAR | Status: AC | PRN
  Administered 2024-05-24: 100 mL via INTRAVENOUS

## 2024-05-24 MED ORDER — MORPHINE SULFATE (PF) 4 MG/ML IV SOLN
4.0000 mg | Freq: Once | INTRAVENOUS | Status: AC
  Administered 2024-05-24: 4 mg via INTRAVENOUS
  Filled 2024-05-24: qty 1

## 2024-05-24 MED ORDER — ALUM & MAG HYDROXIDE-SIMETH 400-400-40 MG/5ML PO SUSP: 10.0000 mL | Freq: Four times a day (QID) | ORAL | 0 refills | Status: AC | PRN

## 2024-05-24 MED ORDER — LIDOCAINE VISCOUS HCL 2 % MT SOLN
15.0000 mL | Freq: Once | OROMUCOSAL | Status: AC
  Administered 2024-05-24: 15 mL via ORAL
  Filled 2024-05-24: qty 15

## 2024-05-24 MED ORDER — FAMOTIDINE 20 MG PO TABS
20.0000 mg | ORAL_TABLET | Freq: Once | ORAL | Status: AC
  Administered 2024-05-24: 20 mg via ORAL
  Filled 2024-05-24: qty 1

## 2024-05-24 NOTE — Discharge Instructions (Addendum)
 Your evaluation in the emergency department was notable for a mass near your esophagus and diaphragm.  I am unsure as to the exact cause of this, but I do think you should be evaluated further.  Please follow-up with your gastroenterologist and primary care doctor for ongoing evaluation, and return to the emergency department with any new or worsening symptoms.  Continue to take your antacid medication as already prescribed, I have also prescribed you Maalox to use in addition at home.  I have also placed a referral for you to follow-up with an oncologist to help facilitate the workup of your new abdominal mass.

## 2024-05-24 NOTE — ED Provider Notes (Addendum)
 [facility] Provider Note    Event Date/Time   First MD Initiated Contact with Patient 05/24/24 1041     (approximate)   History   Chest Pain  Patient to ED via POV for left sided CP. Ongoing x2 weeks. States non-radiating pain and nothing makes it better or worse. Denies cardiac hx.   HPI Ian [name] is a 63 y.o. male PMH COPD, prior CVA, T2DM, Barrett's esophagus, bipolar 1 disorder, hypertension, GERD presents for evaluation of epigastric/left upper quadrant pain - Ongoing for about 1 month, constant, refractory to Motrin  which she has been taking over the past week.  Gradually worsening pain.  No black or bloody stools.  Tolerating p.o. but with some diminished appetite.  Pain currently 8/10. - No fever.  Contrary to triage note, denies any chest pain.  No shortness of breath. - Notes that he gets endoscopies annually to monitor possible precancerous tissue  Per chart review, last myocardial perfusion scan in 2017, unremarkable.  Last endoscopy January of this year, gastritis noticed as well as Barrett's esophagus.      Physical Exam   Triage Vital Signs: ED Triage Vitals [05/24/24 1017]  Encounter Vitals Group     BP 134/86     Systolic BP Percentile      Diastolic BP Percentile      Pulse Rate 80     Resp 17     Temp 97.9 F (36.6 C)     Temp Source Oral     SpO2 92 %     Weight 270 lb (122.5 kg)     Height 6' (1.829 m)     Head Circumference      Peak Flow      Pain Score 5     Pain Loc      Pain Education      Exclude from Growth Chart     Most recent vital signs: Vitals:   05/24/24 1017 05/24/24 1423  BP: 134/86 (!) 146/96  Pulse: 80 71  Resp: 17 19  Temp: 97.9 F (36.6 C)   SpO2: 92% 94%     General: Awake, no distress.  CV:  Good peripheral perfusion. RRR, RP 2+ Resp:  Normal effort. CTAB but mild end expiratory wheezing noted bilaterally Abd:  No distention. + Moderate tenderness palpation epigastrium and  left upper quadrant.  No tenderness elsewhere throughout abdomen.   ED Results / Procedures / Treatments   Labs (all labs ordered are listed, but only abnormal results are displayed) Labs Reviewed  BASIC METABOLIC PANEL WITH GFR - Abnormal; Notable for the following components:      Result Value   Glucose, Bld 105 (*)    All other components within normal limits  CBC - Abnormal; Notable for the following components:   WBC 13.1 (*)    All other components within normal limits  HEPATIC FUNCTION PANEL  LIPASE, BLOOD  TROPONIN I (HIGH SENSITIVITY)     EKG  Ecg = sinus rhythm, rate 78, no ST elevation or depression, no significant repolarization abnormality, normal axis, normal intervals.  No clear evidence of ischemia nor arrhythmia on my interpretation.   RADIOLOGY Radiology interpreted by myself and radiology reports reviewed.  Notable for new mass at gastroesophageal junction.   PROCEDURES:  Critical Care performed: No  Procedures   MEDICATIONS ORDERED IN ED: Medications  morphine (PF) 4 MG/ML injection 4 mg (4 mg Intravenous Given 05/24/24 1218)  alum & mag hydroxide-simeth (MAALOX/MYLANTA) 200-200-20  MG/5ML suspension 30 mL (30 mLs Oral Given 05/24/24 1216)    And  lidocaine  (XYLOCAINE ) 2 % viscous mouth solution 15 mL (15 mLs Oral Given 05/24/24 1216)  famotidine  (PEPCID ) tablet 20 mg (20 mg Oral Given 05/24/24 1216)  iohexol  (OMNIPAQUE ) 300 MG/ML solution 100 mL (100 mLs Intravenous Contrast Given 05/24/24 1152)     IMPRESSION / MDM / ASSESSMENT AND PLAN / ED COURSE  I reviewed the triage vital signs and the nursing notes.                              DDX/MDM/AP: Differential diagnosis includes, but is not limited to, pancreatitis, gastritis, gastric ulcer, hepatobiliary pathology.  Considered but doubt ACS.  Do not clinically suspect PE, pneumothorax, aortic dissection at this time.  Plan: - Labs  -EKG Chest x-ray CT abdomen pelvis - GI cocktail, morphine -  N.p.o. - Reassess  Patient's presentation is most consistent with acute presentation with potential threat to life or bodily function.  The patient is on the cardiac monitor to evaluate for evidence of arrhythmia and/or significant heart rate changes.  ED course below.  CT notable for mass in gastroesophageal junction, appears new from prior CT in 2018, possible desmoid lesion or tumor.  Feeling somewhat better after pain medications.  Is already on PPI, will add Maalox.  Plan for PMD and GI follow-up for ongoing evaluation and management, CT findings discussed in detail including the possibility of tumor.  Also placed referral to follow-up with heme-onc.  ED return precautions in place.  Patient agrees with plan.  Rx Maalox, Tylenol .    Clinical Course as of 05/24/24 1426  Tue May 24, 2024  1120 CBC with mild leukocytosis, otherwise unremarkable  BMP normal  Initial troponin normal [MM]  1120 X-ray with no acute pathology on my interpretation, radiology report below  IMPRESSION: 1. No acute cardiopulmonary disease. 2. Peribronchial thickening which may relate to chronic bronchitis or smoking.   [MM]  1213 Lipase wnl LFTs wnl [MM]  1340 CT AP: IMPRESSION: *Lobular mass anterior and inferior to the gastroesophageal junction just above the celiac trunk attached to the crus of the diaphragm measures 3.4 x 3.6 x 4.3 cm with central calcification about 1 cm. This was not present on the prior examination from 2018. May represent a desmoid like lesion or tumor. Follow-up recommended. *Moderate amount of residual fecal material throughout the colon without obstruction or constipation. *Status post cholecystectomy.   [MM]    Clinical Course User Index [MM] Collis Deaner, MD     FINAL CLINICAL IMPRESSION(S) / ED DIAGNOSES   Final diagnoses:  Abnormal CT of the abdomen  Epigastric pain     Rx / DC Orders   ED Discharge Orders          Ordered    alum & mag  hydroxide-simeth (MAALOX PLUS) 400-400-40 MG/5ML suspension  Every 6 hours PRN        05/24/24 1424    acetaminophen  (TYLENOL ) 500 MG tablet  Every 6 hours PRN        05/24/24 1425             Note:  This document was prepared using Dragon voice recognition software and may include unintentional dictation errors.   Collis Deaner, MD 05/24/24 1426    Collis Deaner, MD 05/24/24 [phone]

## 2024-05-24 NOTE — ED Triage Notes (Signed)
 Patient to ED via POV for left sided CP. Ongoing x2 weeks. States non-radiating pain and nothing makes it better or worse. Denies cardiac hx.

## 2024-05-26 ENCOUNTER — Encounter: Payer: Self-pay | Admitting: Oncology

## 2024-05-26 ENCOUNTER — Inpatient Hospital Stay: Payer: MEDICAID | Attending: Oncology | Admitting: Oncology

## 2024-05-26 ENCOUNTER — Inpatient Hospital Stay: Payer: MEDICAID

## 2024-05-26 VITALS — BP 128/76 | HR 76 | Temp 97.0°F | Resp 18 | Ht 72.0 in | Wt 270.0 lb

## 2024-05-26 DIAGNOSIS — F17210 Nicotine dependence, cigarettes, uncomplicated: Secondary | ICD-10-CM | POA: Insufficient documentation

## 2024-05-26 DIAGNOSIS — Z79899 Other long term (current) drug therapy: Secondary | ICD-10-CM | POA: Insufficient documentation

## 2024-05-26 DIAGNOSIS — Z8501 Personal history of malignant neoplasm of esophagus: Secondary | ICD-10-CM | POA: Diagnosis not present

## 2024-05-26 DIAGNOSIS — R935 Abnormal findings on diagnostic imaging of other abdominal regions, including retroperitoneum: Secondary | ICD-10-CM

## 2024-05-26 DIAGNOSIS — Z803 Family history of malignant neoplasm of breast: Secondary | ICD-10-CM | POA: Insufficient documentation

## 2024-05-26 DIAGNOSIS — Z806 Family history of leukemia: Secondary | ICD-10-CM | POA: Diagnosis not present

## 2024-05-26 DIAGNOSIS — K2289 Other specified disease of esophagus: Secondary | ICD-10-CM | POA: Diagnosis not present

## 2024-05-26 NOTE — Progress Notes (Signed)
 [facility]  Telephone:([phone]) [phone] Fax:(336) [phone]  ID: Rory Collard OB: [birth year]  MR#: [id number]  AOZ#:[id number]  Patient Care Team: Madaline Scales, Kirby Peoples as PCP - General (Family Medicine) Patient, No Pcp Per (General Practice) Shellie Dials, MD as Consulting Physician (Oncology)  CHIEF COMPLAINT: Gastroesophageal junction mass.  INTERVAL HISTORY: Patient is a 63 year old male with a history of signet cell adenocarcinoma of the esophagus diagnosed in 2013 and treated with endoscopic mucosal resection.  Patient declined surgery or any other treatments at that time.  He had been monitored at [facility] with frequent endoscopies with no evidence of progression of disease.  Patient recently presented to the emergency room with chest pain and noted to have a gastroesophageal junction mass.  Above is since his most recent imaging in 2018, unclear chronicity of the lesion.  He currently feels well and is back to his baseline.  He has no neurologic complaints.  He denies any recent fevers or illnesses.  He has a good appetite and denies weight loss.  He has no chest pain, shortness of breath, cough, or hemoptysis.  He denies any nausea, vomiting, constipation, or diarrhea.  He has no urinary complaints.  Patient offers no further specific complaints today.  REVIEW OF SYSTEMS:   Review of Systems  Constitutional: Negative.  Negative for fever, malaise/fatigue and weight loss.  Respiratory: Negative.  Negative for cough, hemoptysis and shortness of breath.   Cardiovascular: Negative.  Negative for chest pain and leg swelling.  Gastrointestinal: Negative.  Negative for abdominal pain.  Genitourinary: Negative.  Negative for dysuria.  Musculoskeletal: Negative.  Negative for back pain.  Skin: Negative.  Negative for rash.  Neurological: Negative.  Negative for dizziness, focal weakness, weakness and headaches.  Psychiatric/Behavioral: Negative.  The patient is not  nervous/anxious.     As per HPI. Otherwise, a complete review of systems is negative.  PAST MEDICAL HISTORY: Past Medical History:  Diagnosis Date   Anxiety    Barrett esophagus    Bipolar disorder (HCC)    Cancer (HCC)    Chronic hepatitis C (HCC)    COPD (chronic obstructive pulmonary disease) (HCC)    Coronary artery disease    Depression    Diabetes mellitus without complication (HCC)    Dyslipidemia    GERD (gastroesophageal reflux disease)    Hepatitis    Hypertension    Leukocytosis    Pre-diabetes    Primary malignant neoplasm (HCC)    Stroke (HCC) 2014   "mini-stroke" per patient    PAST SURGICAL HISTORY: Past Surgical History:  Procedure Laterality Date   ANGIOPLASTY     APPENDECTOMY     BIOPSY  12/28/2023   Procedure: BIOPSY;  Surgeon: Quintin Buckle, DO;  Location: [facility] ENDOSCOPY;  Service: Gastroenterology;;   CARDIAC CATHETERIZATION     CHOLECYSTECTOMY     COLONOSCOPY WITH PROPOFOL  N/A 12/28/2023   Procedure: COLONOSCOPY WITH PROPOFOL ;  Surgeon: Quintin Buckle, DO;  Location: [facility] ENDOSCOPY;  Service: Gastroenterology;  Laterality: N/A;   CORONARY ANGIOPLASTY     ESOPHAGOGASTRODUODENOSCOPY N/A 12/28/2023   Procedure: ESOPHAGOGASTRODUODENOSCOPY (EGD);  Surgeon: Quintin Buckle, DO;  Location: [facility] ENDOSCOPY;  Service: Gastroenterology;  Laterality: N/A;   HEMOSTASIS CLIP PLACEMENT  12/28/2023   Procedure: HEMOSTASIS CLIP PLACEMENT;  Surgeon: Quintin Buckle, DO;  Location: [facility] ENDOSCOPY;  Service: Gastroenterology;;   POLYPECTOMY  12/28/2023   Procedure: POLYPECTOMY;  Surgeon: Quintin Buckle, DO;  Location: [facility] ENDOSCOPY;  Service: Gastroenterology;;   WRIST  SURGERY Right    age 79    FAMILY HISTORY: Family History  Problem Relation Age of Onset   Stroke Father    Leukemia Father    Breast cancer Sister     ADVANCED DIRECTIVES (Y/N):  N  HEALTH MAINTENANCE: Social History   Tobacco Use   Smoking status: Every Day     Current packs/day: 1.50    Types: Cigarettes   Smokeless tobacco: Never  Vaping Use   Vaping status: Never Used  Substance Use Topics   Alcohol use: Yes    Comment: occasional   Drug use: Yes    Types: Marijuana     Colonoscopy:  PAP:  Bone density:  Lipid panel:  Allergies  Allergen Reactions   Fluoxetine Swelling    Anxiety, itching   Hydrocodone -Acetaminophen  Itching   Mirtazapine  Other (See Comments)    nightmares   Hydrochlorothiazide  Other (See Comments)    LEG CRAMPING HYPOMAGNESEMIA   Tramadol Other (See Comments)   Gabapentin  Other (See Comments)    Nightmares     Current Outpatient Medications  Medication Sig Dispense Refill   albuterol  (VENTOLIN  HFA) 108 (90 Base) MCG/ACT inhaler Inhale 2 puffs into the lungs every 6 (six) hours as needed for wheezing or shortness of breath. 18 g 1   amLODipine  (NORVASC ) 10 MG tablet Take 10 mg by mouth daily.     atorvastatin  (LIPITOR) 40 MG tablet Take 1 tablet (40 mg total) by mouth daily. 30 tablet 1   busPIRone  (BUSPAR ) 10 MG tablet Take 2 tablets (20 mg total) by mouth 3 (three) times daily. 180 tablet 1   clonazePAM  (KLONOPIN ) 0.5 MG tablet Take 1 tablet (0.5 mg total) by mouth 2 (two) times daily as needed (Anxiety). 60 tablet 1   cyclobenzaprine  (FLEXERIL ) 10 MG tablet Take 1 tablet (10 mg total) by mouth 2 (two) times daily as needed for muscle spasms. 20 tablet 0   escitalopram  (LEXAPRO ) 20 MG tablet Take 1 tablet (20 mg total) by mouth daily. 30 tablet 1   famotidine  (PEPCID ) 20 MG tablet Take 20 mg by mouth 2 (two) times daily.     fluticasone-salmeterol (ADVAIR ) 500-50 MCG/ACT AEPB Inhale 1 puff into the lungs in the morning and at bedtime.     hydrOXYzine  (ATARAX /VISTARIL ) 25 MG tablet Take 1 tablet (25 mg total) by mouth 3 (three) times daily as needed for anxiety. 90 tablet 1   ibuprofen  (ADVIL ) 600 MG tablet Take 600 mg by mouth 3 (three) times daily.     lisinopril  (ZESTRIL ) 20 MG tablet Take 1 tablet (20 mg  total) by mouth daily. 30 tablet 1   magnesium  oxide (MAG-OX) 400 (240 Mg) MG tablet Take 400 mg by mouth 2 (two) times daily. TAKE 4 TABLETS BY MOUTH 2 TIMES DAILY     metFORMIN  (GLUCOPHAGE ) 1000 MG tablet Take 1 tablet (1,000 mg total) by mouth 2 (two) times daily with a meal. 60 tablet 1   mometasone -formoterol  (DULERA ) 200-5 MCG/ACT AERO Inhale 2 puffs into the lungs 2 (two) times daily. 1 each 1   pantoprazole  (PROTONIX ) 40 MG tablet Take 1 tablet (40 mg total) by mouth daily. 30 tablet 1   pramipexole (MIRAPEX) 0.5 MG tablet Take 0.5 mg by mouth at bedtime.     QUEtiapine  (SEROQUEL  XR) 400 MG 24 hr tablet Take 1 tablet (400 mg total) by mouth at bedtime. 30 tablet 1   rOPINIRole (REQUIP XL) 2 MG 24 hr tablet Take by mouth.  tiotropium (SPIRIVA) 18 MCG inhalation capsule Place 18 mcg into inhaler and inhale daily.     zolpidem  (AMBIEN ) 10 MG tablet Take 1 tablet (10 mg total) by mouth at bedtime as needed for sleep. 30 tablet 1   alum & mag hydroxide-simeth (MAALOX PLUS) 400-400-40 MG/5ML suspension Take 10 mLs by mouth every 6 (six) hours as needed for indigestion. (Patient not taking: Reported on 05/26/2024) 355 mL 0   aspirin  81 MG chewable tablet Chew 1 tablet (81 mg total) by mouth daily. (Patient not taking: Reported on 12/28/2023) 30 tablet 1   predniSONE  (DELTASONE ) 10 MG tablet Take 1 tablet (10 mg total) by mouth daily. Day 1-2: Take 50 mg  ( 5 pills) Day 3-4 : Take 40 mg (4pills) Day 5-6: Take 30 mg (3 pills) Day 7-8:  Take 20 mg (2 pills) Day 9:  Take 10mg  (1 pill) (Patient not taking: Reported on 05/26/2024) 29 tablet 0   No current facility-administered medications for this visit.    OBJECTIVE: Vitals:   05/26/24 1513  BP: 128/76  Pulse: 76  Resp: 18  Temp: (!) 97 F (36.1 C)  SpO2: 95%     Body mass index is 36.62 kg/m.    ECOG FS:0 - Asymptomatic  General: Well-developed, well-nourished, no acute distress. Eyes: Pink conjunctiva, anicteric sclera. HEENT:  Normocephalic, moist mucous membranes. Lungs: No audible wheezing or coughing. Heart: Regular rate and rhythm. Abdomen: Soft, nontender, no obvious distention. Musculoskeletal: No edema, cyanosis, or clubbing. Neuro: Alert, answering all questions appropriately. Cranial nerves grossly intact. Skin: No rashes or petechiae noted. Psych: Normal affect. Lymphatics: No cervical, calvicular, axillary or inguinal LAD.   LAB RESULTS:  Lab Results  Component Value Date   NA 136 05/24/2024   K 4.1 05/24/2024   CL 101 05/24/2024   CO2 25 05/24/2024   GLUCOSE 105 (H) 05/24/2024   BUN 12 05/24/2024   CREATININE 0.85 05/24/2024   CALCIUM  8.9 05/24/2024   PROT 6.8 05/24/2024   ALBUMIN 4.0 05/24/2024   AST 16 05/24/2024   ALT 16 05/24/2024   ALKPHOS 69 05/24/2024   BILITOT 0.8 05/24/2024   GFRNONAA >60 05/24/2024   GFRAA >60 09/05/2020    Lab Results  Component Value Date   WBC 13.1 (H) 05/24/2024   NEUTROABS 11.6 (H) 11/03/2023   HGB 15.0 05/24/2024   HCT 47.9 05/24/2024   MCV 85.2 05/24/2024   PLT 236 05/24/2024     STUDIES: CT ABDOMEN PELVIS W CONTRAST Result Date: 05/24/2024 CLINICAL DATA:  Epigastric pain for 1 month EXAM: CT ABDOMEN AND PELVIS WITH CONTRAST TECHNIQUE: Multidetector CT imaging of the abdomen and pelvis was performed using the standard protocol following bolus administration of intravenous contrast. RADIATION DOSE REDUCTION: This exam was performed according to the departmental dose-optimization program which includes automated exposure control, adjustment of the mA and/or kV according to patient size and/or use of iterative reconstruction technique. CONTRAST:  OMNIPAQUE  IOHEXOL  300 MG/ML  SOLN COMPARISON:  CT abdomen and pelvis July 12, 2017 FINDINGS: Lower chest: No infiltrates or consolidations, no pleural effusions lobular mass anterior and inferior to the gastroesophageal junction just above the celiac trunk attached to the crus of the diaphragm measures  3.4 x 3.6 cm in the AP, and transverse diameter by about 4.3 cm in longitudinal diameter with central calcification about 1 cm. This was not present on the prior examination from 2018. May represent a desmoid like lesion or tumor. Hepatobiliary: No focal liver abnormality is seen. Status post  cholecystectomy. No biliary dilatation. Pancreas: Pancreas normal size. No masses calcifications or inflammatory changes. Spleen: Spleen normal size.  No masses. Adrenals/Urinary Tract: Adrenal glands are normal size. Follow-up recommended. Kidneys are normal. No masses calcifications or hydronephrosis Stomach/Bowel: No small or large bowel obstruction or inflammatory changes. Moderate amount of residual fecal material throughout the colon without obstruction or constipation. Vascular/Lymphatic: No significant vascular findings are present. No enlarged abdominal or pelvic lymph nodes. Reproductive: .  No masses. Bladder unremarkable. Other: Anterior abdominal wall unremarkable without evidence of umbilical or inguinal hernias Musculoskeletal: Visualized portion of the thoracolumbar spine and pelvic structures grossly unremarkable without evidence of fracture bony abnormalities or soft tissue masses. IMPRESSION: *Lobular mass anterior and inferior to the gastroesophageal junction just above the celiac trunk attached to the crus of the diaphragm measures 3.4 x 3.6 x 4.3 cm with central calcification about 1 cm. This was not present on the prior examination from 2018. May represent a desmoid like lesion or tumor. Follow-up recommended. *Moderate amount of residual fecal material throughout the colon without obstruction or constipation. *Status post cholecystectomy. Electronically Signed   By: Fredrich Jefferson M.D.   On: 05/24/2024 13:24   DG Chest 2 View Result Date: 05/24/2024 CLINICAL DATA:  Chest pain EXAM: CHEST - 2 VIEW COMPARISON:  11/03/2023 FINDINGS: Midline trachea. Normal heart size and mediastinal contours. No pleural  effusion or pneumothorax. Diffuse peribronchial thickening. Clear lungs. IMPRESSION: 1. No acute cardiopulmonary disease. 2. Peribronchial thickening which may relate to chronic bronchitis or smoking. Electronically Signed   By: Lore Rode M.D.   On: 05/24/2024 10:53    ASSESSMENT: Gastroesophageal junction mass.  PLAN:    Gastroesophageal junction mass: CT scan results from May 24, 2024 reviewed independently and report as above but the gastroesophageal junction mass just above the celiac trunk measuring 3.4 x 3.6 x 4.3 cm.  There is also central calcification reported.  This was not present at his most recent imaging 7 years ago in 2018.  Patient has a history of signet cell adenocarcinoma of the esophagus in 2013 that was only treated with endoscopic mucosal resection.  Patient refused all other treatments.  He has been monitored closely at [facility] with frequent endoscopies with no report of recurrent disease.  Will get a PET scan to further evaluate.  If negative, no further follow-up is necessary.  If positive, patient may require EUS for biopsy.  Return to clinic 2 to 3 days after PET scan to discuss the results.  I spent a total of 60 minutes reviewing chart data, face-to-face evaluation with the patient, counseling and coordination of care as detailed above.   Patient expressed understanding and was in agreement with this plan. He also understands that He can call clinic at any time with any questions, concerns, or complaints.    Cancer Staging  No matching staging information was found for the patient.   Shellie Dials, MD   05/26/2024 5:17 PM

## 2024-06-01 ENCOUNTER — Ambulatory Visit
Admission: RE | Admit: 2024-06-01 | Discharge: 2024-06-01 | Disposition: A | Discharge status: Home / Self Care | Payer: MEDICAID | Source: Ambulatory Visit | Attending: Oncology | Admitting: Oncology

## 2024-06-01 DIAGNOSIS — R935 Abnormal findings on diagnostic imaging of other abdominal regions, including retroperitoneum: Secondary | ICD-10-CM | POA: Insufficient documentation

## 2024-06-01 DIAGNOSIS — Z8501 Personal history of malignant neoplasm of esophagus: Secondary | ICD-10-CM | POA: Insufficient documentation

## 2024-06-01 LAB — GLUCOSE, CAPILLARY: Glucose-Capillary: 101 mg/dL — ABNORMAL HIGH (ref 70–99)

## 2024-06-01 MED ORDER — FLUDEOXYGLUCOSE F - 18 (FDG) INJECTION
13.0100 | Freq: Once | INTRAVENOUS | Status: AC | PRN
  Administered 2024-06-01: 13.01 via INTRAVENOUS

## 2024-06-08 ENCOUNTER — Encounter: Payer: Self-pay | Admitting: Oncology

## 2024-06-08 ENCOUNTER — Telehealth: Payer: Self-pay

## 2024-06-08 ENCOUNTER — Inpatient Hospital Stay (HOSPITAL_BASED_OUTPATIENT_CLINIC_OR_DEPARTMENT_OTHER): Payer: MEDICAID | Admitting: Oncology

## 2024-06-08 VITALS — BP 124/79 | HR 88 | Temp 97.9°F | Resp 16 | Wt 270.0 lb

## 2024-06-08 DIAGNOSIS — Z8501 Personal history of malignant neoplasm of esophagus: Secondary | ICD-10-CM | POA: Diagnosis not present

## 2024-06-08 DIAGNOSIS — K2289 Other specified disease of esophagus: Secondary | ICD-10-CM | POA: Diagnosis not present

## 2024-06-08 NOTE — Progress Notes (Signed)
 [facility]  [phone] [phone]  ID: Ian [name] OB: 06/20/1961  MR#: [id number]  ZDG#:[id number]  Patient Care Team: Madaline Scales, Kirby Peoples as PCP - General (Family Medicine) Patient, No Pcp Per (General Practice) Shellie Dials, MD as Consulting Physician (Oncology)  CHIEF COMPLAINT: Gastroesophageal junction mass.  INTERVAL HISTORY: Patient returns to clinic today for further evaluation and discussion of his PET scan results.  He currently feels well and is asymptomatic.  He does not complain of any dysphagia or difficulty swallowing.  He denies any pain.  He has no neurologic complaints.  He denies any recent fevers or illnesses.  He has a good appetite and denies weight loss.  He has no chest pain, shortness of breath, cough, or hemoptysis.  He denies any nausea, vomiting, constipation, or diarrhea.  He has no urinary complaints.  Patient offers no specific complaints today.  REVIEW OF SYSTEMS:   Review of Systems  Constitutional: Negative.  Negative for fever, malaise/fatigue and weight loss.  Respiratory: Negative.  Negative for cough, hemoptysis and shortness of breath.   Cardiovascular: Negative.  Negative for chest pain and leg swelling.  Gastrointestinal: Negative.  Negative for abdominal pain.  Genitourinary: Negative.  Negative for dysuria.  Musculoskeletal: Negative.  Negative for back pain.  Skin: Negative.  Negative for rash.  Neurological: Negative.  Negative for dizziness, focal weakness, weakness and headaches.  Psychiatric/Behavioral: Negative.  The patient is not nervous/anxious.     As per HPI. Otherwise, a complete review of systems is negative.  PAST MEDICAL HISTORY: Past Medical History:  Diagnosis Date   Anxiety    Barrett esophagus    Bipolar disorder (HCC)    Cancer (HCC)    Chronic hepatitis C (HCC)    COPD (chronic obstructive pulmonary disease) (HCC)    Coronary artery disease    Depression     Diabetes mellitus without complication (HCC)    Dyslipidemia    GERD (gastroesophageal reflux disease)    Hepatitis    Hypertension    Leukocytosis    Pre-diabetes    Primary malignant neoplasm (HCC)    Stroke (HCC) 2014   mini-stroke per patient    PAST SURGICAL HISTORY: Past Surgical History:  Procedure Laterality Date   ANGIOPLASTY     APPENDECTOMY     BIOPSY  12/28/2023   Procedure: BIOPSY;  Surgeon: Quintin Buckle, DO;  Location: [facility] ENDOSCOPY;  Service: Gastroenterology;;   CARDIAC CATHETERIZATION     CHOLECYSTECTOMY     COLONOSCOPY WITH PROPOFOL  N/A 12/28/2023   Procedure: COLONOSCOPY WITH PROPOFOL ;  Surgeon: Quintin Buckle, DO;  Location: [facility] ENDOSCOPY;  Service: Gastroenterology;  Laterality: N/A;   CORONARY ANGIOPLASTY     ESOPHAGOGASTRODUODENOSCOPY N/A 12/28/2023   Procedure: ESOPHAGOGASTRODUODENOSCOPY (EGD);  Surgeon: Quintin Buckle, DO;  Location: [facility] ENDOSCOPY;  Service: Gastroenterology;  Laterality: N/A;   HEMOSTASIS CLIP PLACEMENT  12/28/2023   Procedure: HEMOSTASIS CLIP PLACEMENT;  Surgeon: Quintin Buckle, DO;  Location: [facility] ENDOSCOPY;  Service: Gastroenterology;;   POLYPECTOMY  12/28/2023   Procedure: POLYPECTOMY;  Surgeon: Quintin Buckle, DO;  Location: [facility] ENDOSCOPY;  Service: Gastroenterology;;   WRIST SURGERY Right    age 88    FAMILY HISTORY: Family History  Problem Relation Age of Onset   Stroke Father    Leukemia Father    Breast cancer Sister     ADVANCED DIRECTIVES (Y/N):  N  HEALTH MAINTENANCE: Social History   Tobacco Use   Smoking status: Every Day  Current packs/day: 1.50    Types: Cigarettes   Smokeless tobacco: Never  Vaping Use   Vaping status: Never Used  Substance Use Topics   Alcohol use: Yes    Comment: occasional   Drug use: Yes    Types: Marijuana     Colonoscopy:  PAP:  Bone density:  Lipid panel:  Allergies  Allergen Reactions   Fluoxetine Swelling    Anxiety, itching    Hydrocodone -Acetaminophen  Itching   Mirtazapine  Other (See Comments)    nightmares   Hydrochlorothiazide  Other (See Comments)    LEG CRAMPING HYPOMAGNESEMIA   Tramadol Other (See Comments)   Gabapentin  Other (See Comments)    Nightmares     Current Outpatient Medications  Medication Sig Dispense Refill   albuterol  (VENTOLIN  HFA) 108 (90 Base) MCG/ACT inhaler Inhale 2 puffs into the lungs every 6 (six) hours as needed for wheezing or shortness of breath. 18 g 1   alum & mag hydroxide-simeth (MAALOX PLUS) 400-400-40 MG/5ML suspension Take 10 mLs by mouth every 6 (six) hours as needed for indigestion. 355 mL 0   amLODipine  (NORVASC ) 10 MG tablet Take 10 mg by mouth daily.     aspirin  81 MG chewable tablet Chew 1 tablet (81 mg total) by mouth daily. 30 tablet 1   atorvastatin  (LIPITOR) 40 MG tablet Take 1 tablet (40 mg total) by mouth daily. 30 tablet 1   busPIRone  (BUSPAR ) 10 MG tablet Take 2 tablets (20 mg total) by mouth 3 (three) times daily. 180 tablet 1   clonazePAM  (KLONOPIN ) 0.5 MG tablet Take 1 tablet (0.5 mg total) by mouth 2 (two) times daily as needed (Anxiety). 60 tablet 1   cyclobenzaprine  (FLEXERIL ) 10 MG tablet Take 1 tablet (10 mg total) by mouth 2 (two) times daily as needed for muscle spasms. 20 tablet 0   escitalopram  (LEXAPRO ) 20 MG tablet Take 1 tablet (20 mg total) by mouth daily. 30 tablet 1   famotidine  (PEPCID ) 20 MG tablet Take 20 mg by mouth 2 (two) times daily.     fluticasone-salmeterol (ADVAIR ) 500-50 MCG/ACT AEPB Inhale 1 puff into the lungs in the morning and at bedtime.     hydrOXYzine  (ATARAX /VISTARIL ) 25 MG tablet Take 1 tablet (25 mg total) by mouth 3 (three) times daily as needed for anxiety. 90 tablet 1   ibuprofen  (ADVIL ) 600 MG tablet Take 600 mg by mouth 3 (three) times daily.     lisinopril  (ZESTRIL ) 20 MG tablet Take 1 tablet (20 mg total) by mouth daily. 30 tablet 1   magnesium  oxide (MAG-OX) 400 (240 Mg) MG tablet Take 400 mg by mouth 2 (two)  times daily. TAKE 4 TABLETS BY MOUTH 2 TIMES DAILY     metFORMIN  (GLUCOPHAGE ) 1000 MG tablet Take 1 tablet (1,000 mg total) by mouth 2 (two) times daily with a meal. 60 tablet 1   mometasone -formoterol  (DULERA ) 200-5 MCG/ACT AERO Inhale 2 puffs into the lungs 2 (two) times daily. 1 each 1   pantoprazole  (PROTONIX ) 40 MG tablet Take 1 tablet (40 mg total) by mouth daily. 30 tablet 1   pramipexole (MIRAPEX) 0.5 MG tablet Take 0.5 mg by mouth at bedtime.     QUEtiapine  (SEROQUEL  XR) 400 MG 24 hr tablet Take 1 tablet (400 mg total) by mouth at bedtime. 30 tablet 1   rOPINIRole (REQUIP XL) 2 MG 24 hr tablet Take by mouth.     tiotropium (SPIRIVA) 18 MCG inhalation capsule Place 18 mcg into inhaler and inhale daily.  zolpidem  (AMBIEN ) 10 MG tablet Take 1 tablet (10 mg total) by mouth at bedtime as needed for sleep. 30 tablet 1   predniSONE  (DELTASONE ) 10 MG tablet Take 1 tablet (10 mg total) by mouth daily. Day 1-2: Take 50 mg  ( 5 pills) Day 3-4 : Take 40 mg (4pills) Day 5-6: Take 30 mg (3 pills) Day 7-8:  Take 20 mg (2 pills) Day 9:  Take 10mg  (1 pill) (Patient not taking: Reported on 06/08/2024) 29 tablet 0   No current facility-administered medications for this visit.    OBJECTIVE: Vitals:   06/08/24 1000  BP: 124/79  Pulse: 88  Resp: 16  Temp: 97.9 F (36.6 C)  SpO2: 95%     Body mass index is 36.62 kg/m.    ECOG FS:0 - Asymptomatic  General: Well-developed, well-nourished, no acute distress. Eyes: Pink conjunctiva, anicteric sclera. HEENT: Normocephalic, moist mucous membranes. Lungs: No audible wheezing or coughing. Heart: Regular rate and rhythm. Abdomen: Soft, nontender, no obvious distention. Musculoskeletal: No edema, cyanosis, or clubbing. Neuro: Alert, answering all questions appropriately. Cranial nerves grossly intact. Skin: No rashes or petechiae noted. Psych: Normal affect.  LAB RESULTS:  Lab Results  Component Value Date   NA 136 05/24/2024   K 4.1 05/24/2024    CL 101 05/24/2024   CO2 25 05/24/2024   GLUCOSE 105 (H) 05/24/2024   BUN 12 05/24/2024   CREATININE 0.85 05/24/2024   CALCIUM  8.9 05/24/2024   PROT 6.8 05/24/2024   ALBUMIN 4.0 05/24/2024   AST 16 05/24/2024   ALT 16 05/24/2024   ALKPHOS 69 05/24/2024   BILITOT 0.8 05/24/2024   GFRNONAA >60 05/24/2024   GFRAA >60 09/05/2020    Lab Results  Component Value Date   WBC 13.1 (H) 05/24/2024   NEUTROABS 11.6 (H) 11/03/2023   HGB 15.0 05/24/2024   HCT 47.9 05/24/2024   MCV 85.2 05/24/2024   PLT 236 05/24/2024     STUDIES: NM PET Image Initial (PI) Skull Base To Thigh Result Date: 06/03/2024 CLINICAL DATA:  GE junction mass. EXAM: NUCLEAR MEDICINE PET SKULL BASE TO THIGH TECHNIQUE: 13.01 mCi F-18 FDG was injected intravenously. Full-ring PET imaging was performed from the skull base to thigh after the radiotracer. CT data was obtained and used for attenuation correction and anatomic localization. Fasting blood glucose: 101 mg/dl COMPARISON:  Abdomen pelvis CT 05/24/2024.  Older exams as well. FINDINGS: Mediastinal blood pool activity: SUV max 2.7 Liver activity: SUV max 3.5 NECK: No specific abnormal uptake seen including along lymph node chains in submandibular, posterior triangle or internal jugular regions. Near symmetric uptake of the visualized intracranial compartment.There is cystic area in the left thyroid lobe on CT image 39 measuring 2.1 cm. Only minimal uptake in this location maximum SUV of 3.8. Please correlate for any known history. Otherwise a follow up ultrasound could be performed when clinically appropriate. Incidental CT findings: The parotid glands, submandibular glands unremarkable. Paranasal sinuses and mastoid air cells are grossly clear. CHEST: No specific abnormal radiotracer uptake above blood pool in the axillary regions, hilum or mediastinum. No abnormal lung uptake. Incidental CT findings: Thoracic aorta is normal course and caliber with some mild calcified  plaque. Heart is nonenlarged. No pericardial effusion. There is some linear opacity seen along bases likely scar or atelectasis. Emphysematous changes are identified. Diffuse breathing motion. ABDOMEN/PELVIS: Is a focal area of asymmetric uptake at the GE junction on image 90 of the PET scan with maximum SUV of 7.2. Just caudal to this  is also a secondary mass with some calcification obscuring the branch points of the celiac axis. This has some patchy uptake maximum SUV of 11.5. This is lesion described by prior CT scan and again would measure proximally 3.4 x 3.6 cm. There is some adjacent stranding as well. An aggressive lesion is possible. Elsewhere no specific abnormal uptake. There is physiologic distribution of tracer along the parenchymal organs, bowel and renal collecting systems. Incidental CT findings: Nonspecific perinephric stranding. No renal or ureteral stones. Contracted urinary bladder. Scattered mild vascular calcifications. Previous cholecystectomy. Few prominent upper abdominal nodes identified as well which are not hypermetabolic as seen on previous examination such as centrally image 98 measuring 13 mm in short axis. Portacaval node image 98 measuring 13 mm in short axis. Stomach, small bowel large bowel are nondilated. SKELETON: No specific abnormal uptake along the visualized osseous structures. Incidental CT findings: Scattered degenerative changes. IMPRESSION: There is a soft tissue mass with calcification and adjacent stranding once again identified just superior to the celiac trunk with abnormal radiotracer uptake consistent with a neoplasm. There is also a small area of increased uptake in the adjacent gastric wall at the GE junction. Recommend further evaluation. No additional areas of abnormal uptake to suggest additional spread of disease of potential neoplasm this location. There is a cystic lesion left thyroid lobe with some low-level uptake. Recommend thyroid ultrasound when  appropriate if there is no known history. Electronically Signed   By: Adrianna Horde M.D.   On: 06/03/2024 14:58   CT ABDOMEN PELVIS W CONTRAST Result Date: 05/24/2024 CLINICAL DATA:  Epigastric pain for 1 month EXAM: CT ABDOMEN AND PELVIS WITH CONTRAST TECHNIQUE: Multidetector CT imaging of the abdomen and pelvis was performed using the standard protocol following bolus administration of intravenous contrast. RADIATION DOSE REDUCTION: This exam was performed according to the departmental dose-optimization program which includes automated exposure control, adjustment of the mA and/or kV according to patient size and/or use of iterative reconstruction technique. CONTRAST:  OMNIPAQUE  IOHEXOL  300 MG/ML  SOLN COMPARISON:  CT abdomen and pelvis July 12, 2017 FINDINGS: Lower chest: No infiltrates or consolidations, no pleural effusions lobular mass anterior and inferior to the gastroesophageal junction just above the celiac trunk attached to the crus of the diaphragm measures 3.4 x 3.6 cm in the AP, and transverse diameter by about 4.3 cm in longitudinal diameter with central calcification about 1 cm. This was not present on the prior examination from 2018. May represent a desmoid like lesion or tumor. Hepatobiliary: No focal liver abnormality is seen. Status post cholecystectomy. No biliary dilatation. Pancreas: Pancreas normal size. No masses calcifications or inflammatory changes. Spleen: Spleen normal size.  No masses. Adrenals/Urinary Tract: Adrenal glands are normal size. Follow-up recommended. Kidneys are normal. No masses calcifications or hydronephrosis Stomach/Bowel: No small or large bowel obstruction or inflammatory changes. Moderate amount of residual fecal material throughout the colon without obstruction or constipation. Vascular/Lymphatic: No significant vascular findings are present. No enlarged abdominal or pelvic lymph nodes. Reproductive: .  No masses. Bladder unremarkable. Other: Anterior  abdominal wall unremarkable without evidence of umbilical or inguinal hernias Musculoskeletal: Visualized portion of the thoracolumbar spine and pelvic structures grossly unremarkable without evidence of fracture bony abnormalities or soft tissue masses. IMPRESSION: *Lobular mass anterior and inferior to the gastroesophageal junction just above the celiac trunk attached to the crus of the diaphragm measures 3.4 x 3.6 x 4.3 cm with central calcification about 1 cm. This was not present on the prior examination  from 2018. May represent a desmoid like lesion or tumor. Follow-up recommended. *Moderate amount of residual fecal material throughout the colon without obstruction or constipation. *Status post cholecystectomy. Electronically Signed   By: Fredrich Jefferson M.D.   On: 05/24/2024 13:24   DG Chest 2 View Result Date: 05/24/2024 CLINICAL DATA:  Chest pain EXAM: CHEST - 2 VIEW COMPARISON:  11/03/2023 FINDINGS: Midline trachea. Normal heart size and mediastinal contours. No pleural effusion or pneumothorax. Diffuse peribronchial thickening. Clear lungs. IMPRESSION: 1. No acute cardiopulmonary disease. 2. Peribronchial thickening which may relate to chronic bronchitis or smoking. Electronically Signed   By: Lore Rode M.D.   On: 05/24/2024 10:53    ASSESSMENT: Gastroesophageal junction mass.  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  Despite recommendation, patient declined surgery or any other treatments at that time.  He had been monitored at [facility] with frequent endoscopies without evidence of recurrent or progression of disease.  Patient recently presented to the emergency room with chest pain and noted to have a gastroesophageal junction mass.  CT scan results from May 24, 2024 reviewed independently with a gastroesophageal junction mass just above the celiac trunk measuring 3.4  x 3.6 x 4.3 cm.  There is also central calcification reported.  This was not present at his most recent imaging 7 years ago in 2018.  PET scan results from May 27, 2024 reviewed independently and reported as above confirming hypermetabolism of lesion, but no other evidence of disease.  Patient will require EUS for biopsy for further evaluation.  Referral has been made back to [facility].  Patient will follow-up approximately 1 week after his biopsy to discuss the results and treatment planning if necessary.    I spent a total of 30 minutes reviewing chart data, face-to-face evaluation with the patient, counseling and coordination of care as detailed above.   Patient expressed understanding and was in agreement with this plan. He also understands that He can call clinic at any time with any questions, concerns, or complaints.    Cancer Staging  No matching staging information was found for the patient.   Shellie Dials, MD   06/08/2024 10:18 AM

## 2024-06-08 NOTE — Telephone Encounter (Signed)
 Referral sent to [facility] for EUS.

## 2024-06-10 DIAGNOSIS — R948 Abnormal results of function studies of other organs and systems: Principal | ICD-10-CM

## 2024-06-10 DIAGNOSIS — Z8501 Personal history of malignant neoplasm of esophagus: Principal | ICD-10-CM

## 2024-06-13 ENCOUNTER — Inpatient Hospital Stay: Admit: 2024-06-13 | Discharge: 2024-06-14 | Payer: Medicaid (Managed Care)

## 2024-06-17 ENCOUNTER — Encounter: Admit: 2024-06-17 | Discharge: 2024-06-17 | Payer: Medicaid (Managed Care)

## 2024-06-17 ENCOUNTER — Inpatient Hospital Stay: Admit: 2024-06-17 | Discharge: 2024-06-17 | Payer: Medicaid (Managed Care)

## 2024-06-23 ENCOUNTER — Encounter: Payer: Self-pay | Admitting: Oncology

## 2024-06-23 ENCOUNTER — Ambulatory Visit: Payer: MEDICAID | Admitting: Oncology

## 2024-06-27 ENCOUNTER — Encounter: Payer: Self-pay | Admitting: Oncology

## 2024-06-27 ENCOUNTER — Inpatient Hospital Stay: Payer: MEDICAID | Attending: Oncology | Admitting: Oncology

## 2024-06-27 VITALS — BP 126/84 | HR 83 | Temp 97.4°F | Resp 18 | Ht 72.0 in | Wt 273.0 lb

## 2024-06-27 DIAGNOSIS — Z806 Family history of leukemia: Secondary | ICD-10-CM | POA: Insufficient documentation

## 2024-06-27 DIAGNOSIS — Z8501 Personal history of malignant neoplasm of esophagus: Secondary | ICD-10-CM

## 2024-06-27 DIAGNOSIS — Z79899 Other long term (current) drug therapy: Secondary | ICD-10-CM | POA: Insufficient documentation

## 2024-06-27 DIAGNOSIS — Z803 Family history of malignant neoplasm of breast: Secondary | ICD-10-CM | POA: Insufficient documentation

## 2024-06-27 DIAGNOSIS — F17210 Nicotine dependence, cigarettes, uncomplicated: Secondary | ICD-10-CM | POA: Diagnosis not present

## 2024-06-27 DIAGNOSIS — C155 Malignant neoplasm of lower third of esophagus: Secondary | ICD-10-CM | POA: Diagnosis present

## 2024-06-27 DIAGNOSIS — C159 Malignant neoplasm of esophagus, unspecified: Secondary | ICD-10-CM | POA: Insufficient documentation

## 2024-06-27 NOTE — Progress Notes (Signed)
 START ON PATHWAY REGIMEN - Gastroesophageal     A cycle is every 7 days, concurrent with RT:     Paclitaxel      Carboplatin   **Always confirm dose/schedule in your pharmacy ordering system**  Patient Characteristics: Esophageal & GE Junction, Adenocarcinoma, Preoperative or Nonsurgical Candidate, M0 (Clinical Staging), cT2 or Higher or cN+, Surgical Candidate (Up to cT4a), GE Junction, MSS/pMMR or MSI Unknown Therapeutic Status: Preoperative or Nonsurgical Candidate, M0 (Clinical Staging) Histology: Adenocarcinoma Disease Classification: GE Junction AJCC Grade: G3 AJCC 8 Stage Grouping: Unknown AJCC T Category: cTX AJCC N Category: cN0 AJCC M Category: cM0 Microsatellite/Mismatch Repair Status: Unknown Intent of Therapy: Curative Intent, Discussed with Patient

## 2024-06-27 NOTE — Progress Notes (Signed)
 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  INTERVAL HISTORY: Patient returns to clinic today for further evaluation, discussion of his pathology results, and treatment planning.  He continues to feel well and remains asymptomatic.  He does not complain of any dysphagia or difficulty swallowing.  He denies any pain.  He has no neurologic complaints.  He denies any recent fevers or illnesses.  He has a good appetite and denies weight loss.  He has no chest pain, shortness of breath, cough, or hemoptysis.  He denies any nausea, vomiting, constipation, or diarrhea.  He has no urinary complaints.  Patient offers no specific complaints today.  REVIEW OF SYSTEMS:   Review of Systems  Constitutional: Negative.  Negative for fever, malaise/fatigue and weight loss.  Respiratory: Negative.  Negative for cough, hemoptysis and shortness of breath.   Cardiovascular: Negative.  Negative for chest pain and leg swelling.  Gastrointestinal: Negative.  Negative for abdominal pain.  Genitourinary: Negative.  Negative for dysuria.  Musculoskeletal: Negative.  Negative for back pain.  Skin: Negative.  Negative for rash.  Neurological: Negative.  Negative for dizziness, focal weakness, weakness and headaches.  Psychiatric/Behavioral: Negative.  The patient is not nervous/anxious.     As per HPI. Otherwise, a complete review of systems is negative.  PAST MEDICAL HISTORY: Past Medical History:  Diagnosis Date   Anxiety    Barrett esophagus    Bipolar disorder (HCC)    Cancer (HCC)    Chronic hepatitis C (HCC)    COPD (chronic  obstructive pulmonary disease) (HCC)    Coronary artery disease    Depression    Diabetes mellitus without complication (HCC)    Dyslipidemia    GERD (gastroesophageal reflux disease)    Hepatitis    Hypertension    Leukocytosis    Pre-diabetes    Primary malignant neoplasm (HCC)    Stroke (HCC) 2014   mini-stroke per patient    PAST SURGICAL HISTORY: Past Surgical History:  Procedure Laterality Date   ANGIOPLASTY     APPENDECTOMY     BIOPSY  12/28/2023   Procedure: BIOPSY;  Surgeon: Onita Elspeth Sharper, DO;  Location: [facility] ENDOSCOPY;  Service: Gastroenterology;;   CARDIAC CATHETERIZATION     CHOLECYSTECTOMY     COLONOSCOPY WITH PROPOFOL  N/A 12/28/2023   Procedure: COLONOSCOPY WITH PROPOFOL ;  Surgeon: Onita Elspeth Sharper, DO;  Location: [facility] ENDOSCOPY;  Service: Gastroenterology;  Laterality: N/A;   CORONARY ANGIOPLASTY     ESOPHAGOGASTRODUODENOSCOPY N/A 12/28/2023   Procedure: ESOPHAGOGASTRODUODENOSCOPY (EGD);  Surgeon: Onita Elspeth Sharper, DO;  Location: [facility] ENDOSCOPY;  Service: Gastroenterology;  Laterality: N/A;   HEMOSTASIS CLIP PLACEMENT  12/28/2023   Procedure: HEMOSTASIS CLIP PLACEMENT;  Surgeon: Onita Elspeth Sharper, DO;  Location: [facility] ENDOSCOPY;  Service: Gastroenterology;;   POLYPECTOMY  12/28/2023   Procedure: POLYPECTOMY;  Surgeon: Onita Elspeth Sharper, DO;  Location: [facility] ENDOSCOPY;  Service: Gastroenterology;;   WRIST SURGERY Right    age 63    FAMILY HISTORY: Family History  Problem Relation Age of Onset   Stroke Father    Leukemia Father    Breast cancer Sister     ADVANCED DIRECTIVES (Y/N):  N  HEALTH MAINTENANCE: Social History  Tobacco Use   Smoking status: Every Day    Current packs/day: 1.50    Types: Cigarettes   Smokeless tobacco: Never  Vaping Use   Vaping status: Never Used  Substance Use Topics   Alcohol use: Yes    Comment: occasional   Drug use: Yes    Types: Marijuana     Colonoscopy:  PAP:  Bone density:  Lipid  panel:  Allergies  Allergen Reactions   Fluoxetine Swelling    Anxiety, itching   Hydrocodone -Acetaminophen  Itching   Mirtazapine  Other (See Comments)    nightmares   Hydrochlorothiazide  Other (See Comments)    LEG CRAMPING HYPOMAGNESEMIA   Tramadol Other (See Comments)   Gabapentin  Other (See Comments)    Nightmares     Current Outpatient Medications  Medication Sig Dispense Refill   albuterol  (VENTOLIN  HFA) 108 (90 Base) MCG/ACT inhaler Inhale 2 puffs into the lungs every 6 (six) hours as needed for wheezing or shortness of breath. 18 g 1   alum & mag hydroxide-simeth (MAALOX PLUS) 400-400-40 MG/5ML suspension Take 10 mLs by mouth every 6 (six) hours as needed for indigestion. 355 mL 0   amLODipine  (NORVASC ) 10 MG tablet Take 10 mg by mouth daily.     aspirin  81 MG chewable tablet Chew 1 tablet (81 mg total) by mouth daily. 30 tablet 1   atorvastatin  (LIPITOR) 40 MG tablet Take 1 tablet (40 mg total) by mouth daily. 30 tablet 1   busPIRone  (BUSPAR ) 10 MG tablet Take 2 tablets (20 mg total) by mouth 3 (three) times daily. 180 tablet 1   clonazePAM  (KLONOPIN ) 0.5 MG tablet Take 1 tablet (0.5 mg total) by mouth 2 (two) times daily as needed (Anxiety). 60 tablet 1   cyclobenzaprine  (FLEXERIL ) 10 MG tablet Take 1 tablet (10 mg total) by mouth 2 (two) times daily as needed for muscle spasms. 20 tablet 0   escitalopram  (LEXAPRO ) 20 MG tablet Take 1 tablet (20 mg total) by mouth daily. 30 tablet 1   fluticasone-salmeterol (ADVAIR ) 500-50 MCG/ACT AEPB Inhale 1 puff into the lungs in the morning and at bedtime.     hydrOXYzine  (ATARAX /VISTARIL ) 25 MG tablet Take 1 tablet (25 mg total) by mouth 3 (three) times daily as needed for anxiety. 90 tablet 1   ibuprofen  (ADVIL ) 600 MG tablet Take 600 mg by mouth 3 (three) times daily.     lisinopril  (ZESTRIL ) 20 MG tablet Take 1 tablet (20 mg total) by mouth daily. 30 tablet 1   magnesium  oxide (MAG-OX) 400 (240 Mg) MG tablet Take 400 mg by mouth 2  (two) times daily. TAKE 4 TABLETS BY MOUTH 2 TIMES DAILY     metFORMIN  (GLUCOPHAGE ) 1000 MG tablet Take 1 tablet (1,000 mg total) by mouth 2 (two) times daily with a meal. 60 tablet 1   mometasone -formoterol  (DULERA ) 200-5 MCG/ACT AERO Inhale 2 puffs into the lungs 2 (two) times daily. 1 each 1   pantoprazole  (PROTONIX ) 40 MG tablet Take 1 tablet (40 mg total) by mouth daily. 30 tablet 1   pramipexole (MIRAPEX) 0.5 MG tablet Take 0.5 mg by mouth at bedtime.     QUEtiapine  (SEROQUEL  XR) 400 MG 24 hr tablet Take 1 tablet (400 mg total) by mouth at bedtime. 30 tablet 1   rOPINIRole (REQUIP XL) 2 MG 24 hr tablet Take by mouth.     tiotropium (SPIRIVA) 18 MCG inhalation capsule Place 18 mcg into inhaler and inhale daily.     zolpidem  (AMBIEN ) 10 MG tablet  Take 1 tablet (10 mg total) by mouth at bedtime as needed for sleep. 30 tablet 1   famotidine  (PEPCID ) 20 MG tablet Take 20 mg by mouth 2 (two) times daily. (Patient not taking: Reported on 06/27/2024)     predniSONE  (DELTASONE ) 10 MG tablet Take 1 tablet (10 mg total) by mouth daily. Day 1-2: Take 50 mg  ( 5 pills) Day 3-4 : Take 40 mg (4pills) Day 5-6: Take 30 mg (3 pills) Day 7-8:  Take 20 mg (2 pills) Day 9:  Take 10mg  (1 pill) (Patient not taking: Reported on 06/27/2024) 29 tablet 0   No current facility-administered medications for this visit.    OBJECTIVE: Vitals:   06/27/24 0917  BP: 126/84  Pulse: 83  Resp: 18  Temp: (!) 97.4 F (36.3 C)  SpO2: 98%     Body mass index is 37.03 kg/m.    ECOG FS:0 - Asymptomatic  General: Well-developed, well-nourished, no acute distress. Eyes: Pink conjunctiva, anicteric sclera. HEENT: Normocephalic, moist mucous membranes. Lungs: No audible wheezing or coughing. Heart: Regular rate and rhythm. Abdomen: Soft, nontender, no obvious distention. Musculoskeletal: No edema, cyanosis, or clubbing. Neuro: Alert, answering all questions appropriately. Cranial nerves grossly intact. Skin: No rashes or  petechiae noted. Psych: Normal affect.  LAB RESULTS:  Lab Results  Component Value Date   NA 136 05/24/2024   K 4.1 05/24/2024   CL 101 05/24/2024   CO2 25 05/24/2024   GLUCOSE 105 (H) 05/24/2024   BUN 12 05/24/2024   CREATININE 0.85 05/24/2024   CALCIUM  8.9 05/24/2024   PROT 6.8 05/24/2024   ALBUMIN 4.0 05/24/2024   AST 16 05/24/2024   ALT 16 05/24/2024   ALKPHOS 69 05/24/2024   BILITOT 0.8 05/24/2024   GFRNONAA >60 05/24/2024   GFRAA >60 09/05/2020    Lab Results  Component Value Date   WBC 13.1 (H) 05/24/2024   NEUTROABS 11.6 (H) 11/03/2023   HGB 15.0 05/24/2024   HCT 47.9 05/24/2024   MCV 85.2 05/24/2024   PLT 236 05/24/2024     STUDIES: NM PET Image Initial (PI) Skull Base To Thigh Result Date: 06/03/2024 CLINICAL DATA:  GE junction mass. EXAM: NUCLEAR MEDICINE PET SKULL BASE TO THIGH TECHNIQUE: 13.01 mCi F-18 FDG was injected intravenously. Full-ring PET imaging was performed from the skull base to thigh after the radiotracer. CT data was obtained and used for attenuation correction and anatomic localization. Fasting blood glucose: 101 mg/dl COMPARISON:  Abdomen pelvis CT 05/24/2024.  Older exams as well. FINDINGS: Mediastinal blood pool activity: SUV max 2.7 Liver activity: SUV max 3.5 NECK: No specific abnormal uptake seen including along lymph node chains in submandibular, posterior triangle or internal jugular regions. Near symmetric uptake of the visualized intracranial compartment.There is cystic area in the left thyroid lobe on CT image 39 measuring 2.1 cm. Only minimal uptake in this location maximum SUV of 3.8. Please correlate for any known history. Otherwise a follow up ultrasound could be performed when clinically appropriate. Incidental CT findings: The parotid glands, submandibular glands unremarkable. Paranasal sinuses and mastoid air cells are grossly clear. CHEST: No specific abnormal radiotracer uptake above blood pool in the axillary regions, hilum or  mediastinum. No abnormal lung uptake. Incidental CT findings: Thoracic aorta is normal course and caliber with some mild calcified plaque. Heart is nonenlarged. No pericardial effusion. There is some linear opacity seen along bases likely scar or atelectasis. Emphysematous changes are identified. Diffuse breathing motion. ABDOMEN/PELVIS: Is a focal area of asymmetric uptake  at the GE junction on image 90 of the PET scan with maximum SUV of 7.2. Just caudal to this is also a secondary mass with some calcification obscuring the branch points of the celiac axis. This has some patchy uptake maximum SUV of 11.5. This is lesion described by prior CT scan and again would measure proximally 3.4 x 3.6 cm. There is some adjacent stranding as well. An aggressive lesion is possible. Elsewhere no specific abnormal uptake. There is physiologic distribution of tracer along the parenchymal organs, bowel and renal collecting systems. Incidental CT findings: Nonspecific perinephric stranding. No renal or ureteral stones. Contracted urinary bladder. Scattered mild vascular calcifications. Previous cholecystectomy. Few prominent upper abdominal nodes identified as well which are not hypermetabolic as seen on previous examination such as centrally image 98 measuring 13 mm in short axis. Portacaval node image 98 measuring 13 mm in short axis. Stomach, small bowel large bowel are nondilated. SKELETON: No specific abnormal uptake along the visualized osseous structures. Incidental CT findings: Scattered degenerative changes. IMPRESSION: There is a soft tissue mass with calcification and adjacent stranding once again identified just superior to the celiac trunk with abnormal radiotracer uptake consistent with a neoplasm. There is also a small area of increased uptake in the adjacent gastric wall at the GE junction. Recommend further evaluation. No additional areas of abnormal uptake to suggest additional spread of disease of potential  neoplasm this location. There is a cystic lesion left thyroid lobe with some low-level uptake. Recommend thyroid ultrasound when appropriate if there is no known history. Electronically Signed   By: Ranell Bring M.D.   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  Unclear if this is recurrent disease or a second primary.  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  Despite recommendation, patient declined surgery or any other treatments at that time.  He had been monitored at [facility] with frequent endoscopies without evidence of recurrent or progression of disease.  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  There is also central calcification reported.  This was not present at his most recent imaging 7 years ago in 2018.  PET scan results from May 27, 2024 confirmed hypermetabolism of lesion, but no other evidence of disease.  Biopsy confirms malignancy.  After a lengthy discussion with the patient and his daughters, he wishes to proceed with treatment this time around and the recommended weekly carboplatin and Taxol along with concurrent XRT followed by possible surgical resection.  A referral has been sent to radiation oncology.  Will also send a referral to [facility] GI surgery call oncology.  Patient declined port placement at this time.  Return to clinic in approximately 2 weeks to initiate cycle 1 of treatment.  I spent a total of 30 minutes reviewing chart data, face-to-face evaluation with  the patient, counseling and coordination of care as detailed above.   Patient expressed understanding and was in agreement with this plan. He also understands that He can call clinic at any time with any questions,  concerns, or complaints.    Cancer Staging  Esophageal cancer [facility]) Staging form: Esophagus - Adenocarcinoma, AJCC 8th Edition - Clinical stage from 06/27/2024: Stage Unknown (cTX, cN0, cM0, G3) - Signed by Jacobo Evalene PARAS, MD on 06/27/2024 Stage prefix: Initial diagnosis Total positive nodes: 0 Histologic grading system: 3 grade system   Evalene PARAS Jacobo, MD   06/27/2024 11:01 AM

## 2024-06-28 ENCOUNTER — Other Ambulatory Visit: Payer: Self-pay

## 2024-06-29 ENCOUNTER — Ambulatory Visit
Admission: RE | Admit: 2024-06-29 | Discharge: 2024-06-29 | Disposition: A | Discharge status: Home / Self Care | Payer: MEDICAID | Source: Ambulatory Visit | Attending: Radiation Oncology | Admitting: Radiation Oncology

## 2024-06-29 ENCOUNTER — Encounter: Payer: Self-pay | Admitting: Radiation Oncology

## 2024-06-29 VITALS — BP 125/85 | HR 86 | Temp 99.0°F | Resp 20 | Wt 275.0 lb

## 2024-06-29 DIAGNOSIS — F319 Bipolar disorder, unspecified: Secondary | ICD-10-CM | POA: Diagnosis not present

## 2024-06-29 DIAGNOSIS — Z7984 Long term (current) use of oral hypoglycemic drugs: Secondary | ICD-10-CM | POA: Diagnosis not present

## 2024-06-29 DIAGNOSIS — I10 Essential (primary) hypertension: Secondary | ICD-10-CM | POA: Insufficient documentation

## 2024-06-29 DIAGNOSIS — B182 Chronic viral hepatitis C: Secondary | ICD-10-CM | POA: Diagnosis not present

## 2024-06-29 DIAGNOSIS — J449 Chronic obstructive pulmonary disease, unspecified: Secondary | ICD-10-CM | POA: Insufficient documentation

## 2024-06-29 DIAGNOSIS — Z9221 Personal history of antineoplastic chemotherapy: Secondary | ICD-10-CM | POA: Insufficient documentation

## 2024-06-29 DIAGNOSIS — E119 Type 2 diabetes mellitus without complications: Secondary | ICD-10-CM | POA: Insufficient documentation

## 2024-06-29 DIAGNOSIS — E785 Hyperlipidemia, unspecified: Secondary | ICD-10-CM | POA: Diagnosis not present

## 2024-06-29 DIAGNOSIS — Z79899 Other long term (current) drug therapy: Secondary | ICD-10-CM | POA: Insufficient documentation

## 2024-06-29 DIAGNOSIS — Z8719 Personal history of other diseases of the digestive system: Secondary | ICD-10-CM | POA: Diagnosis not present

## 2024-06-29 DIAGNOSIS — Z8673 Personal history of transient ischemic attack (TIA), and cerebral infarction without residual deficits: Secondary | ICD-10-CM | POA: Insufficient documentation

## 2024-06-29 DIAGNOSIS — Z7951 Long term (current) use of inhaled steroids: Secondary | ICD-10-CM | POA: Insufficient documentation

## 2024-06-29 DIAGNOSIS — Z7952 Long term (current) use of systemic steroids: Secondary | ICD-10-CM | POA: Diagnosis not present

## 2024-06-29 DIAGNOSIS — I2510 Atherosclerotic heart disease of native coronary artery without angina pectoris: Secondary | ICD-10-CM | POA: Diagnosis not present

## 2024-06-29 DIAGNOSIS — Z806 Family history of leukemia: Secondary | ICD-10-CM | POA: Diagnosis not present

## 2024-06-29 DIAGNOSIS — Z803 Family history of malignant neoplasm of breast: Secondary | ICD-10-CM | POA: Insufficient documentation

## 2024-06-29 DIAGNOSIS — C155 Malignant neoplasm of lower third of esophagus: Secondary | ICD-10-CM | POA: Diagnosis present

## 2024-06-29 DIAGNOSIS — F1290 Cannabis use, unspecified, uncomplicated: Secondary | ICD-10-CM | POA: Insufficient documentation

## 2024-06-29 DIAGNOSIS — Z7982 Long term (current) use of aspirin: Secondary | ICD-10-CM | POA: Insufficient documentation

## 2024-06-29 DIAGNOSIS — F17210 Nicotine dependence, cigarettes, uncomplicated: Secondary | ICD-10-CM | POA: Insufficient documentation

## 2024-06-29 NOTE — Consult Note (Signed)
 NEW PATIENT EVALUATION  Name: Ian [name]  MRN: [id number]  Date:   06/29/2024     DOB: 05-14-1961   This 63 y.o. 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  REFERRING PHYSICIAN: Jacobo Evalene PARAS, MD  CHIEF COMPLAINT:  Chief Complaint  Patient presents with   Esophageal Cancer    DIAGNOSIS: The encounter diagnosis was Malignant neoplasm of lower third of esophagus (HCC).   PREVIOUS INVESTIGATIONS:  PET/CT CT scans reviewed Pathology reports reviewed Clinical notes reviewed  HPI: Patient is a 63 year old male with an interesting history of signet cell adenocarcinoma of the esophagus which I believe was more upper third treated with endoscopic mucosal resections repeated times at [facility].  He had a follow-up surveillance CT scan back in June which showed a 3.4 x 3.6 x 4.3 mass at the GE junction just above the celiac trunk.  There was central calcification not seen in imaging 7 years prior.  PET scan was performed showing hypermetabolic activity of the lesion again just superior to the celiac trunk with abnormal radiotracer uptake consistent with neoplasm.  There was also a small area of increased uptake in the adjacent gastric wall the GE junction.  Upper endoscopy was performed showing Z-line irregular with nodularity primarily on the cardia side. Biopsied. Periesophageal/peri-celiac mass with central    calcification seen.  Biopsy was positive for invasive adenocarcinoma poorly differentiated HER2/neu was negative.  There was mismatch repair proteinsPreserved nuclear expression of all MLH1, PMS2, MSH2 and MSH6 proteins.  Patient has been having no significant dysphagia recently he is maintained his weight and he is borderline obese.  He has been seen by medical oncology with recommendation for weekly carboplatin and Taxol along with concurrent  radiation followed by per possible surgical resection.  He is seen today for radiation oncology opinion.  PLANNED TREATMENT REGIMEN: Concurrent chemoradiation  PAST MEDICAL HISTORY:  has a past medical history of Anxiety, Barrett esophagus, Bipolar disorder (HCC), Cancer (HCC), Chronic hepatitis C (HCC), COPD (chronic obstructive pulmonary disease) (HCC), Coronary artery disease, Depression, Diabetes mellitus without complication (HCC), Dyslipidemia, GERD (gastroesophageal reflux disease), Hepatitis, Hypertension, Leukocytosis, Pre-diabetes, Primary malignant neoplasm (HCC), and Stroke (HCC) (2014).    PAST SURGICAL HISTORY:  Past Surgical History:  Procedure Laterality Date   ANGIOPLASTY     APPENDECTOMY     BIOPSY  12/28/2023   Procedure: BIOPSY;  Surgeon: Onita Elspeth Sharper, DO;  Location: [facility] ENDOSCOPY;  Service: Gastroenterology;;   CARDIAC CATHETERIZATION     CHOLECYSTECTOMY     COLONOSCOPY WITH PROPOFOL  N/A 12/28/2023   Procedure: COLONOSCOPY WITH PROPOFOL ;  Surgeon: Onita Elspeth Sharper, DO;  Location: [facility] ENDOSCOPY;  Service: Gastroenterology;  Laterality: N/A;   CORONARY ANGIOPLASTY     ESOPHAGOGASTRODUODENOSCOPY N/A 12/28/2023   Procedure: ESOPHAGOGASTRODUODENOSCOPY (EGD);  Surgeon: Onita Elspeth Sharper, DO;  Location: [facility] ENDOSCOPY;  Service: Gastroenterology;  Laterality: N/A;   HEMOSTASIS CLIP PLACEMENT  12/28/2023   Procedure: HEMOSTASIS CLIP PLACEMENT;  Surgeon: Onita Elspeth Sharper, DO;  Location: [facility] ENDOSCOPY;  Service: Gastroenterology;;   POLYPECTOMY  12/28/2023   Procedure: POLYPECTOMY;  Surgeon: Onita Elspeth Sharper, DO;  Location: [facility] ENDOSCOPY;  Service: Gastroenterology;;   WRIST SURGERY Right    age 67    FAMILY HISTORY: family history includes Breast cancer in his sister; Leukemia in his father; Stroke in his father.  SOCIAL HISTORY:  reports that he has been smoking cigarettes. He has never  used smokeless tobacco. He reports current alcohol use. He reports  current drug use. Drug: Marijuana.  ALLERGIES: Fluoxetine, Hydrocodone -acetaminophen , Mirtazapine , Hydrochlorothiazide , Tramadol, and Gabapentin   MEDICATIONS:  Current Outpatient Medications  Medication Sig Dispense Refill   albuterol  (VENTOLIN  HFA) 108 (90 Base) MCG/ACT inhaler Inhale 2 puffs into the lungs every 6 (six) hours as needed for wheezing or shortness of breath. 18 g 1   alum & mag hydroxide-simeth (MAALOX PLUS) 400-400-40 MG/5ML suspension Take 10 mLs by mouth every 6 (six) hours as needed for indigestion. 355 mL 0   amLODipine  (NORVASC ) 10 MG tablet Take 10 mg by mouth daily.     aspirin  81 MG chewable tablet Chew 1 tablet (81 mg total) by mouth daily. 30 tablet 1   atorvastatin  (LIPITOR) 40 MG tablet Take 1 tablet (40 mg total) by mouth daily. 30 tablet 1   busPIRone  (BUSPAR ) 10 MG tablet Take 2 tablets (20 mg total) by mouth 3 (three) times daily. 180 tablet 1   clonazePAM  (KLONOPIN ) 0.5 MG tablet Take 1 tablet (0.5 mg total) by mouth 2 (two) times daily as needed (Anxiety). 60 tablet 1   cyclobenzaprine  (FLEXERIL ) 10 MG tablet Take 1 tablet (10 mg total) by mouth 2 (two) times daily as needed for muscle spasms. 20 tablet 0   escitalopram  (LEXAPRO ) 20 MG tablet Take 1 tablet (20 mg total) by mouth daily. 30 tablet 1   famotidine  (PEPCID ) 20 MG tablet Take 20 mg by mouth 2 (two) times daily.     fluticasone-salmeterol (ADVAIR ) 500-50 MCG/ACT AEPB Inhale 1 puff into the lungs in the morning and at bedtime.     hydrOXYzine  (ATARAX /VISTARIL ) 25 MG tablet Take 1 tablet (25 mg total) by mouth 3 (three) times daily as needed for anxiety. 90 tablet 1   ibuprofen  (ADVIL ) 600 MG tablet Take 600 mg by mouth 3 (three) times daily.     lisinopril  (ZESTRIL ) 20 MG tablet Take 1 tablet (20 mg total) by mouth daily. 30 tablet 1   magnesium  oxide (MAG-OX) 400 (240 Mg) MG tablet Take 400 mg by mouth 2 (two) times daily. TAKE 4 TABLETS BY MOUTH 2 TIMES DAILY     metFORMIN  (GLUCOPHAGE ) 1000 MG  tablet Take 1 tablet (1,000 mg total) by mouth 2 (two) times daily with a meal. 60 tablet 1   mometasone -formoterol  (DULERA ) 200-5 MCG/ACT AERO Inhale 2 puffs into the lungs 2 (two) times daily. 1 each 1   pantoprazole  (PROTONIX ) 40 MG tablet Take 1 tablet (40 mg total) by mouth daily. 30 tablet 1   pramipexole (MIRAPEX) 0.5 MG tablet Take 0.5 mg by mouth at bedtime.     predniSONE  (DELTASONE ) 10 MG tablet Take 1 tablet (10 mg total) by mouth daily. Day 1-2: Take 50 mg  ( 5 pills) Day 3-4 : Take 40 mg (4pills) Day 5-6: Take 30 mg (3 pills) Day 7-8:  Take 20 mg (2 pills) Day 9:  Take 10mg  (1 pill) 29 tablet 0   QUEtiapine  (SEROQUEL  XR) 400 MG 24 hr tablet Take 1 tablet (400 mg total) by mouth at bedtime. 30 tablet 1   rOPINIRole (REQUIP XL) 2 MG 24 hr tablet Take by mouth.     tiotropium (SPIRIVA) 18 MCG inhalation capsule Place 18 mcg into inhaler and inhale daily.     zolpidem  (AMBIEN ) 10 MG tablet Take 1 tablet (10 mg total) by mouth at bedtime as needed for sleep. 30 tablet 1   No current facility-administered medications for this encounter.  ECOG PERFORMANCE STATUS:  0 - Asymptomatic  REVIEW OF SYSTEMS: Patient has a history of angioplasty appendectomy anxiety disorder Barrett's esophagus bipolar disorder chronic hepatitis COPD coronary artery disease depression diabetes mellitus without complications dyslipidemia GERD hepatitis hypertension. Patient denies any weight loss, fatigue, weakness, fever, chills or night sweats. Patient denies any loss of vision, blurred vision. Patient denies any ringing  of the ears or hearing loss. No irregular heartbeat. Patient denies heart murmur or history of fainting. Patient denies any chest pain or pain radiating to her upper extremities. Patient denies any shortness of breath, difficulty breathing at night, cough or hemoptysis. Patient denies any swelling in the lower legs. Patient denies any nausea vomiting, vomiting of blood, or coffee ground material in  the vomitus. Patient denies any stomach pain. Patient states has had normal bowel movements no significant constipation or diarrhea. Patient denies any dysuria, hematuria or significant nocturia. Patient denies any problems walking, swelling in the joints or loss of balance. Patient denies any skin changes, loss of hair or loss of weight. Patient denies any excessive worrying or anxiety or significant depression. Patient denies any problems with insomnia. Patient denies excessive thirst, polyuria, polydipsia. Patient denies any swollen glands, patient denies easy bruising or easy bleeding. Patient denies any recent infections, allergies or URI. Patient s visual fields have not changed significantly in recent time.   PHYSICAL EXAM: BP 125/85   Pulse 86   Temp 99 F (37.2 C) (Tympanic)   Resp 20   Wt 275 lb (124.7 kg)   BMI 37.30 kg/m  Mildly obese male in NAD.  Well-developed well-nourished patient in NAD. HEENT reveals PERLA, EOMI, discs not visualized.  Oral cavity is clear. No oral mucosal lesions are identified. Neck is clear without evidence of cervical or supraclavicular adenopathy. Lungs are clear to A&P. Cardiac examination is essentially unremarkable with regular rate and rhythm without murmur rub or thrill. Abdomen is benign with no organomegaly or masses noted. Motor sensory and DTR levels are equal and symmetric in the upper and lower extremities. Cranial nerves II through XII are grossly intact. Proprioception is intact. No peripheral adenopathy or edema is identified. No motor or sensory levels are noted. Crude visual fields are within normal range.  LABORATORY DATA: Pathology reports reviewed    RADIOLOGY RESULTS: PET scan and CT scan of chest reviewed compatible with above-stated findings   IMPRESSION: Poorly differentiated adenocarcinoma the GE junction in 63 year old male  PLAN: At this time elect to go ahead with concurrent chemoradiation therapy I would plan on delivering 54  Gray in 30 fractions using IMRT treatment planning and delivery.  I would choose IMRT to spare critical structures such as cardiac structures normal lung volume spinal cord and GI structures.  Risks and benefits of treatment including possible worsening of his dysphagia alteration blood counts skin reaction fatigue nausea all were described in detail to the patient.  He comprehends my treatment plan well.  Patient has some several family events happening next several weeks I have scheduled his simulation in 2 weeks and will coordinate his concurrent chemotherapy with medical oncology.  There will be extra effort by both professional staff as well as technical staff to coordinate and manage concurrent chemoradiation and ensuing side effects during his treatments. Patient comprehends recommendations well.  Simulation appointment was given.  I would like to take this opportunity to thank you for allowing me to participate in the care of your patient.SABRA Marcey Penton, MD

## 2024-07-01 ENCOUNTER — Other Ambulatory Visit: Payer: Self-pay

## 2024-07-06 ENCOUNTER — Other Ambulatory Visit: Payer: Self-pay

## 2024-07-13 ENCOUNTER — Ambulatory Visit: Payer: MEDICAID

## 2024-08-01 ENCOUNTER — Ambulatory Visit
Admission: RE | Admit: 2024-08-01 | Discharge: 2024-08-01 | Disposition: A | Discharge status: Home / Self Care | Payer: MEDICAID | Source: Ambulatory Visit | Attending: Radiation Oncology | Admitting: Radiation Oncology

## 2024-08-01 DIAGNOSIS — Z79899 Other long term (current) drug therapy: Secondary | ICD-10-CM | POA: Diagnosis not present

## 2024-08-01 DIAGNOSIS — Z5111 Encounter for antineoplastic chemotherapy: Secondary | ICD-10-CM | POA: Insufficient documentation

## 2024-08-01 DIAGNOSIS — Z803 Family history of malignant neoplasm of breast: Secondary | ICD-10-CM | POA: Insufficient documentation

## 2024-08-01 DIAGNOSIS — F17210 Nicotine dependence, cigarettes, uncomplicated: Secondary | ICD-10-CM | POA: Insufficient documentation

## 2024-08-01 DIAGNOSIS — Z806 Family history of leukemia: Secondary | ICD-10-CM | POA: Insufficient documentation

## 2024-08-01 DIAGNOSIS — C155 Malignant neoplasm of lower third of esophagus: Secondary | ICD-10-CM | POA: Insufficient documentation

## 2024-08-01 DIAGNOSIS — Z510 Encounter for antineoplastic radiation therapy: Secondary | ICD-10-CM | POA: Insufficient documentation

## 2024-08-04 ENCOUNTER — Other Ambulatory Visit: Payer: Self-pay

## 2024-08-04 ENCOUNTER — Other Ambulatory Visit: Payer: Self-pay | Admitting: Oncology

## 2024-08-04 ENCOUNTER — Other Ambulatory Visit: Payer: Self-pay | Admitting: *Deleted

## 2024-08-04 DIAGNOSIS — C155 Malignant neoplasm of lower third of esophagus: Secondary | ICD-10-CM

## 2024-08-04 MED ORDER — PROCHLORPERAZINE MALEATE 10 MG PO TABS: 10.0000 mg | ORAL_TABLET | Freq: Four times a day (QID) | ORAL | 1 refills | Status: DC | PRN

## 2024-08-04 MED ORDER — ONDANSETRON HCL 8 MG PO TABS: 8.0000 mg | ORAL_TABLET | Freq: Three times a day (TID) | ORAL | 1 refills | Status: DC | PRN

## 2024-08-04 NOTE — Progress Notes (Signed)
 Pharmacist Chemotherapy Monitoring - Initial Assessment    Anticipated start date: 08/15/24   The following has been reviewed per standard work regarding the patient's treatment regimen: The patient's diagnosis, treatment plan and drug doses, and organ/hematologic function Lab orders and baseline tests specific to treatment regimen  The treatment plan start date, drug sequencing, and pre-medications Prior authorization status  Patient's documented medication list, including drug-drug interaction screen and prescriptions for anti-emetics and supportive care specific to the treatment regimen The drug concentrations, fluid compatibility, administration routes, and timing of the medications to be used The patient's access for treatment and lifetime cumulative dose history, if applicable  The patient's medication allergies and previous infusion related reactions, if applicable    Poorly differentiated adenocarcinoma of the lower third esophagus, HER2 negative Biopsy confirms malignancy  recommend weekly carboplatin  and Taxol  along with concurrent XRT followed by possible surgical resection.     Ian [name], RPH, 08/04/2024  3:29 PM

## 2024-08-05 ENCOUNTER — Other Ambulatory Visit: Payer: Self-pay | Admitting: *Deleted

## 2024-08-05 DIAGNOSIS — C155 Malignant neoplasm of lower third of esophagus: Secondary | ICD-10-CM

## 2024-08-08 DIAGNOSIS — Z5111 Encounter for antineoplastic chemotherapy: Secondary | ICD-10-CM | POA: Diagnosis not present

## 2024-08-10 ENCOUNTER — Ambulatory Visit
Admission: RE | Admit: 2024-08-10 | Discharge: 2024-08-10 | Discharge status: Home / Self Care | Payer: MEDICAID | Source: Ambulatory Visit | Attending: Radiation Oncology | Admitting: Radiation Oncology

## 2024-08-11 ENCOUNTER — Other Ambulatory Visit: Payer: Self-pay

## 2024-08-11 ENCOUNTER — Inpatient Hospital Stay: Payer: MEDICAID | Attending: Oncology

## 2024-08-11 ENCOUNTER — Ambulatory Visit
Admission: RE | Admit: 2024-08-11 | Discharge: 2024-08-11 | Discharge status: Home / Self Care | Payer: MEDICAID | Source: Ambulatory Visit | Attending: Radiation Oncology | Admitting: Radiation Oncology

## 2024-08-11 ENCOUNTER — Encounter: Payer: Self-pay | Admitting: Oncology

## 2024-08-11 DIAGNOSIS — Z79899 Other long term (current) drug therapy: Secondary | ICD-10-CM | POA: Insufficient documentation

## 2024-08-11 DIAGNOSIS — Z5111 Encounter for antineoplastic chemotherapy: Secondary | ICD-10-CM | POA: Insufficient documentation

## 2024-08-11 DIAGNOSIS — C155 Malignant neoplasm of lower third of esophagus: Secondary | ICD-10-CM | POA: Insufficient documentation

## 2024-08-11 LAB — RAD ONC ARIA SESSION SUMMARY
Course Elapsed Days: 0
Plan Fractions Treated to Date: 1
Plan Prescribed Dose Per Fraction: 1.8 Gy
Plan Total Fractions Prescribed: 30
Plan Total Prescribed Dose: 54 Gy
Reference Point Dosage Given to Date: 1.8 Gy
Reference Point Session Dosage Given: 1.8 Gy
Session Number: 1

## 2024-08-11 LAB — CBC (CANCER CENTER ONLY)
HCT: 46.6 % (ref 39.0–52.0)
Hemoglobin: 14.8 g/dL (ref 13.0–17.0)
MCH: 27 pg (ref 26.0–34.0)
MCHC: 31.8 g/dL (ref 30.0–36.0)
MCV: 84.9 fL (ref 80.0–100.0)
Platelet Count: 246 K/uL (ref 150–400)
RBC: 5.49 MIL/uL (ref 4.22–5.81)
RDW: 13.4 % (ref 11.5–15.5)
WBC Count: 10 K/uL (ref 4.0–10.5)
nRBC: 0 % (ref 0.0–0.2)

## 2024-08-11 LAB — BASIC METABOLIC PANEL - CANCER CENTER ONLY
Anion gap: 9 (ref 5–15)
BUN: 13 mg/dL (ref 8–23)
CO2: 24 mmol/L (ref 22–32)
Calcium: 8.7 mg/dL — ABNORMAL LOW (ref 8.9–10.3)
Chloride: 102 mmol/L (ref 98–111)
Creatinine: 0.94 mg/dL (ref 0.61–1.24)
GFR, Estimated: >60 mL/min (ref >60)
Glucose, Bld: 106 mg/dL — ABNORMAL HIGH (ref 70–99)
Potassium: 4.5 mmol/L (ref 3.5–5.1)
Sodium: 135 mmol/L (ref 135–145)

## 2024-08-12 ENCOUNTER — Other Ambulatory Visit: Payer: Self-pay

## 2024-08-12 ENCOUNTER — Other Ambulatory Visit: Payer: Self-pay | Admitting: Oncology

## 2024-08-12 ENCOUNTER — Ambulatory Visit
Admission: RE | Admit: 2024-08-12 | Discharge: 2024-08-12 | Disposition: A | Discharge status: Home / Self Care | Payer: MEDICAID | Source: Ambulatory Visit | Attending: Radiation Oncology | Admitting: Radiation Oncology

## 2024-08-12 DIAGNOSIS — Z5111 Encounter for antineoplastic chemotherapy: Secondary | ICD-10-CM | POA: Diagnosis not present

## 2024-08-12 DIAGNOSIS — C155 Malignant neoplasm of lower third of esophagus: Secondary | ICD-10-CM

## 2024-08-12 LAB — RAD ONC ARIA SESSION SUMMARY
Course Elapsed Days: 1
Plan Fractions Treated to Date: 2
Plan Prescribed Dose Per Fraction: 1.8 Gy
Plan Total Fractions Prescribed: 30
Plan Total Prescribed Dose: 54 Gy
Reference Point Dosage Given to Date: 3.6 Gy
Reference Point Session Dosage Given: 1.8 Gy
Session Number: 2

## 2024-08-15 ENCOUNTER — Inpatient Hospital Stay: Payer: MEDICAID

## 2024-08-15 ENCOUNTER — Encounter: Payer: Self-pay | Admitting: Oncology

## 2024-08-15 ENCOUNTER — Inpatient Hospital Stay (HOSPITAL_BASED_OUTPATIENT_CLINIC_OR_DEPARTMENT_OTHER): Payer: MEDICAID | Admitting: Oncology

## 2024-08-15 ENCOUNTER — Ambulatory Visit
Admission: RE | Admit: 2024-08-15 | Discharge: 2024-08-15 | Disposition: A | Discharge status: Home / Self Care | Payer: MEDICAID | Source: Ambulatory Visit | Attending: Radiation Oncology | Admitting: Radiation Oncology

## 2024-08-15 VITALS — BP 157/92 | HR 80 | Resp 18

## 2024-08-15 VITALS — BP 114/74 | HR 82 | Temp 97.8°F | Resp 18 | Ht 72.0 in | Wt 273.0 lb

## 2024-08-15 DIAGNOSIS — Z5111 Encounter for antineoplastic chemotherapy: Secondary | ICD-10-CM | POA: Diagnosis not present

## 2024-08-15 DIAGNOSIS — C155 Malignant neoplasm of lower third of esophagus: Secondary | ICD-10-CM

## 2024-08-15 LAB — CBC WITH DIFFERENTIAL (CANCER CENTER ONLY)
Abs Immature Granulocytes: 0.05 K/uL (ref 0.00–0.07)
Basophils Absolute: 0.1 K/uL (ref 0.0–0.1)
Basophils Relative: 1 %
Eosinophils Absolute: 0.4 K/uL (ref 0.0–0.5)
Eosinophils Relative: 4 %
HCT: 47.3 % (ref 39.0–52.0)
Hemoglobin: 15.1 g/dL (ref 13.0–17.0)
Immature Granulocytes: 0 %
Lymphocytes Relative: 20 %
Lymphs Abs: 2.3 K/uL (ref 0.7–4.0)
MCH: 26.9 pg (ref 26.0–34.0)
MCHC: 31.9 g/dL (ref 30.0–36.0)
MCV: 84.3 fL (ref 80.0–100.0)
Monocytes Absolute: 0.6 K/uL (ref 0.1–1.0)
Monocytes Relative: 5 %
Neutro Abs: 8.2 K/uL — ABNORMAL HIGH (ref 1.7–7.7)
Neutrophils Relative %: 70 %
Platelet Count: 241 K/uL (ref 150–400)
RBC: 5.61 MIL/uL (ref 4.22–5.81)
RDW: 13.4 % (ref 11.5–15.5)
WBC Count: 11.6 K/uL — ABNORMAL HIGH (ref 4.0–10.5)
nRBC: 0 % (ref 0.0–0.2)

## 2024-08-15 LAB — CMP (CANCER CENTER ONLY)
ALT: 10 U/L (ref 0–44)
AST: 13 U/L — ABNORMAL LOW (ref 15–41)
Albumin: 3.9 g/dL (ref 3.5–5.0)
Alkaline Phosphatase: 85 U/L (ref 38–126)
Anion gap: 8 (ref 5–15)
BUN: 12 mg/dL (ref 8–23)
CO2: 26 mmol/L (ref 22–32)
Calcium: 9.3 mg/dL (ref 8.9–10.3)
Chloride: 100 mmol/L (ref 98–111)
Creatinine: 0.81 mg/dL (ref 0.61–1.24)
GFR, Estimated: >60 mL/min (ref >60)
Glucose, Bld: 103 mg/dL — ABNORMAL HIGH (ref 70–99)
Potassium: 4.4 mmol/L (ref 3.5–5.1)
Sodium: 134 mmol/L — ABNORMAL LOW (ref 135–145)
Total Bilirubin: 0.6 mg/dL (ref 0.0–1.2)
Total Protein: 7 g/dL (ref 6.5–8.1)

## 2024-08-15 MED ORDER — SODIUM CHLORIDE 0.9 % IV SOLN
50.0000 mg/m2 | Freq: Once | INTRAVENOUS | Status: AC
  Administered 2024-08-15: 126 mg via INTRAVENOUS
  Filled 2024-08-15: qty 21

## 2024-08-15 MED ORDER — SODIUM CHLORIDE 0.9 % IV SOLN
300.0000 mg | Freq: Once | INTRAVENOUS | Status: AC
  Administered 2024-08-15: 300 mg via INTRAVENOUS
  Filled 2024-08-15: qty 30

## 2024-08-15 MED ORDER — PALONOSETRON HCL INJECTION 0.25 MG/5ML
0.2500 mg | Freq: Once | INTRAVENOUS | Status: AC
  Administered 2024-08-15: 0.25 mg via INTRAVENOUS
  Filled 2024-08-15: qty 5

## 2024-08-15 MED ORDER — FAMOTIDINE IN NACL 20-0.9 MG/50ML-% IV SOLN
20.0000 mg | Freq: Once | INTRAVENOUS | Status: AC
  Administered 2024-08-15: 20 mg via INTRAVENOUS
  Filled 2024-08-15: qty 50

## 2024-08-15 MED ORDER — DEXAMETHASONE SODIUM PHOSPHATE 10 MG/ML IJ SOLN
10.0000 mg | Freq: Once | INTRAMUSCULAR | Status: AC
  Administered 2024-08-15: 10 mg via INTRAVENOUS
  Filled 2024-08-15: qty 1

## 2024-08-15 MED ORDER — SODIUM CHLORIDE 0.9 % IV SOLN
INTRAVENOUS | Status: DC
  Filled 2024-08-15: qty 250

## 2024-08-15 MED ORDER — DIPHENHYDRAMINE HCL 50 MG/ML IJ SOLN
25.0000 mg | Freq: Once | INTRAMUSCULAR | Status: AC
  Administered 2024-08-15: 25 mg via INTRAVENOUS
  Filled 2024-08-15: qty 1

## 2024-08-15 NOTE — Progress Notes (Signed)
 [facility]  [phone] Fax:([phone]) [phone]  ID: Ian [name] OB: February 29, 1961  MR#: [id number]  RDW#:[id number]  Patient Care Team: [name], DEVONNA as PCP - General (Family Medicine) Patient, No Pcp Per (General Practice) Jacobo Evalene PARAS, MD as Consulting Physician (Oncology) Lenn Aran, MD as Consulting Physician (Radiation Oncology)  CHIEF COMPLAINT:  Poorly differentiated adenocarcinoma of the lower third esophagus, HER2 negative.  INTERVAL HISTORY: Patient returns to clinic today for further evaluation and initiation of cycle 1 of weekly carboplatin  and Taxol .  He initiated his daily XRT last week.  He currently feels well and is asymptomatic.  He does not complain of any dysphagia or difficulty swallowing.  He denies any pain.  He has no neurologic complaints.  He denies any recent fevers or illnesses.  He has a good appetite and denies weight loss.  He has no chest pain, shortness of breath, cough, or hemoptysis.  He denies any nausea, vomiting, constipation, or diarrhea.  He has no urinary complaints.  Patient offers no specific complaints today.  REVIEW OF SYSTEMS:   Review of Systems  Constitutional: Negative.  Negative for fever, malaise/fatigue and weight loss.  Respiratory: Negative.  Negative for cough, hemoptysis and shortness of breath.   Cardiovascular: Negative.  Negative for chest pain and leg swelling.  Gastrointestinal: Negative.  Negative for abdominal pain.  Genitourinary: Negative.  Negative for dysuria.  Musculoskeletal: Negative.  Negative for back pain.  Skin: Negative.  Negative for rash.  Neurological: Negative.  Negative for dizziness, focal weakness, weakness and headaches.  Psychiatric/Behavioral: Negative.  The patient is not nervous/anxious.     As per HPI. Otherwise, a complete review of systems is negative.  PAST MEDICAL HISTORY: Past Medical History:  Diagnosis Date   Anxiety    Barrett esophagus     Bipolar disorder (HCC)    Cancer (HCC)    Chronic hepatitis C (HCC)    COPD (chronic obstructive pulmonary disease) (HCC)    Coronary artery disease    Depression    Diabetes mellitus without complication (HCC)    Dyslipidemia    GERD (gastroesophageal reflux disease)    Hepatitis    Hypertension    Leukocytosis    Pre-diabetes    Primary malignant neoplasm (HCC)    Stroke (HCC) 2014   mini-stroke per patient    PAST SURGICAL HISTORY: Past Surgical History:  Procedure Laterality Date   ANGIOPLASTY     APPENDECTOMY     BIOPSY  12/28/2023   Procedure: BIOPSY;  Surgeon: Onita Elspeth Sharper, DO;  Location: [facility] ENDOSCOPY;  Service: Gastroenterology;;   CARDIAC CATHETERIZATION     CHOLECYSTECTOMY     COLONOSCOPY WITH PROPOFOL  N/A 12/28/2023   Procedure: COLONOSCOPY WITH PROPOFOL ;  Surgeon: Onita Elspeth Sharper, DO;  Location: [facility] ENDOSCOPY;  Service: Gastroenterology;  Laterality: N/A;   CORONARY ANGIOPLASTY     ESOPHAGOGASTRODUODENOSCOPY N/A 12/28/2023   Procedure: ESOPHAGOGASTRODUODENOSCOPY (EGD);  Surgeon: Onita Elspeth Sharper, DO;  Location: [facility] ENDOSCOPY;  Service: Gastroenterology;  Laterality: N/A;   HEMOSTASIS CLIP PLACEMENT  12/28/2023   Procedure: HEMOSTASIS CLIP PLACEMENT;  Surgeon: Onita Elspeth Sharper, DO;  Location: [facility] ENDOSCOPY;  Service: Gastroenterology;;   POLYPECTOMY  12/28/2023   Procedure: POLYPECTOMY;  Surgeon: Onita Elspeth Sharper, DO;  Location[facility] ENDOSCOPY;  Service: Gastroenterology;;   WRIST SURGERY Right    age 63    FAMILY HISTORY: Family History  Problem Relation Age of Onset   Stroke Father    Leukemia Father  Breast cancer Sister     ADVANCED DIRECTIVES (Y/N):  N  HEALTH MAINTENANCE: Social History   Tobacco Use   Smoking status: Every Day    Current packs/day: 1.50    Types: Cigarettes   Smokeless tobacco: Never  Vaping Use   Vaping status: Never Used  Substance Use Topics   Alcohol use: Yes    Comment: occasional    Drug use: Yes    Types: Marijuana     Colonoscopy:  PAP:  Bone density:  Lipid panel:  Allergies  Allergen Reactions   Fluoxetine Swelling    Anxiety, itching   Hydrocodone -Acetaminophen  Itching   Mirtazapine  Other (See Comments)    nightmares   Hydrochlorothiazide  Other (See Comments)    LEG CRAMPING HYPOMAGNESEMIA   Tramadol Other (See Comments)   Gabapentin  Other (See Comments)    Nightmares     Current Outpatient Medications  Medication Sig Dispense Refill   albuterol  (VENTOLIN  HFA) 108 (90 Base) MCG/ACT inhaler Inhale 2 puffs into the lungs every 6 (six) hours as needed for wheezing or shortness of breath. 18 g 1   alum & mag hydroxide-simeth (MAALOX PLUS) 400-400-40 MG/5ML suspension Take 10 mLs by mouth every 6 (six) hours as needed for indigestion. 355 mL 0   amLODipine  (NORVASC ) 10 MG tablet Take 10 mg by mouth daily.     aspirin  81 MG chewable tablet Chew 1 tablet (81 mg total) by mouth daily. 30 tablet 1   atorvastatin  (LIPITOR) 40 MG tablet Take 1 tablet (40 mg total) by mouth daily. 30 tablet 1   busPIRone  (BUSPAR ) 10 MG tablet Take 2 tablets (20 mg total) by mouth 3 (three) times daily. 180 tablet 1   clonazePAM  (KLONOPIN ) 0.5 MG tablet Take 1 tablet (0.5 mg total) by mouth 2 (two) times daily as needed (Anxiety). 60 tablet 1   cyclobenzaprine  (FLEXERIL ) 10 MG tablet Take 1 tablet (10 mg total) by mouth 2 (two) times daily as needed for muscle spasms. 20 tablet 0   escitalopram  (LEXAPRO ) 20 MG tablet Take 1 tablet (20 mg total) by mouth daily. 30 tablet 1   famotidine  (PEPCID ) 20 MG tablet Take 20 mg by mouth 2 (two) times daily.     fluticasone-salmeterol (ADVAIR ) 500-50 MCG/ACT AEPB Inhale 1 puff into the lungs in the morning and at bedtime.     hydrOXYzine  (ATARAX /VISTARIL ) 25 MG tablet Take 1 tablet (25 mg total) by mouth 3 (three) times daily as needed for anxiety. 90 tablet 1   ibuprofen  (ADVIL ) 600 MG tablet Take 600 mg by mouth 3 (three) times daily.      lisinopril  (ZESTRIL ) 20 MG tablet Take 1 tablet (20 mg total) by mouth daily. 30 tablet 1   magnesium  oxide (MAG-OX) 400 (240 Mg) MG tablet Take 400 mg by mouth 2 (two) times daily. TAKE 4 TABLETS BY MOUTH 2 TIMES DAILY     metFORMIN  (GLUCOPHAGE ) 1000 MG tablet Take 1 tablet (1,000 mg total) by mouth 2 (two) times daily with a meal. 60 tablet 1   mometasone -formoterol  (DULERA ) 200-5 MCG/ACT AERO Inhale 2 puffs into the lungs 2 (two) times daily. 1 each 1   ondansetron  (ZOFRAN ) 8 MG tablet Take 1 tablet (8 mg total) by mouth every 8 (eight) hours as needed for nausea or vomiting. Start on the third day after chemotherapy. 60 tablet 1   pantoprazole  (PROTONIX ) 40 MG tablet Take 1 tablet (40 mg total) by mouth daily. 30 tablet 1   pramipexole (MIRAPEX) 0.5 MG tablet  Take 0.5 mg by mouth at bedtime.     predniSONE  (DELTASONE ) 10 MG tablet Take 1 tablet (10 mg total) by mouth daily. Day 1-2: Take 50 mg  ( 5 pills) Day 3-4 : Take 40 mg (4pills) Day 5-6: Take 30 mg (3 pills) Day 7-8:  Take 20 mg (2 pills) Day 9:  Take 10mg  (1 pill) 29 tablet 0   prochlorperazine  (COMPAZINE ) 10 MG tablet Take 1 tablet (10 mg total) by mouth every 6 (six) hours as needed for nausea or vomiting. 60 tablet 1   QUEtiapine  (SEROQUEL  XR) 400 MG 24 hr tablet Take 1 tablet (400 mg total) by mouth at bedtime. 30 tablet 1   rOPINIRole (REQUIP XL) 2 MG 24 hr tablet Take by mouth.     tiotropium (SPIRIVA) 18 MCG inhalation capsule Place 18 mcg into inhaler and inhale daily.     zolpidem  (AMBIEN ) 10 MG tablet Take 1 tablet (10 mg total) by mouth at bedtime as needed for sleep. 30 tablet 1   No current facility-administered medications for this visit.    OBJECTIVE: Vitals:   08/15/24 0930  BP: 114/74  Pulse: 82  Resp: 18  Temp: 97.8 F (36.6 C)  SpO2: 94%     Body mass index is 37.03 kg/m.    ECOG FS:0 - Asymptomatic  General: Well-developed, well-nourished, no acute distress. Eyes: Pink conjunctiva, anicteric  sclera. HEENT: Normocephalic, moist mucous membranes. Lungs: No audible wheezing or coughing. Heart: Regular rate and rhythm. Abdomen: Soft, nontender, no obvious distention. Musculoskeletal: No edema, cyanosis, or clubbing. Neuro: Alert, answering all questions appropriately. Cranial nerves grossly intact. Skin: No rashes or petechiae noted. Psych: Normal affect.  LAB RESULTS:  Lab Results  Component Value Date   NA 134 (L) 08/15/2024   K 4.4 08/15/2024   CL 100 08/15/2024   CO2 26 08/15/2024   GLUCOSE 103 (H) 08/15/2024   BUN 12 08/15/2024   CREATININE 0.81 08/15/2024   CALCIUM  9.3 08/15/2024   PROT 7.0 08/15/2024   ALBUMIN 3.9 08/15/2024   AST 13 (L) 08/15/2024   ALT 10 08/15/2024   ALKPHOS 85 08/15/2024   BILITOT 0.6 08/15/2024   GFRNONAA >60 08/15/2024   GFRAA >60 09/05/2020    Lab Results  Component Value Date   WBC 11.6 (H) 08/15/2024   NEUTROABS 8.2 (H) 08/15/2024   HGB 15.1 08/15/2024   HCT 47.3 08/15/2024   MCV 84.3 08/15/2024   PLT 241 08/15/2024     STUDIES: No results found.   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  PLAN:    Poorly differentiated adenocarcinoma of the lower third esophagus, HER2 negative: Confirmed by EUS biopsy at [facility] on June 21, 2024.  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  Unclear if this is recurrent disease or a second primary.  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  Despite recommendation, patient declined surgery or any other treatments at that time.  He had been monitored at [facility] with frequent endoscopies without evidence of recurrent or progression of disease.  CT scan results from May 24, 2024 reviewed independently with a gastroesophageal junction mass just above the celiac trunk measuring 3.4 x 3.6 x 4.3  cm.  There is also central calcification reported.  This was not present at his most recent imaging 7 years ago in 2018.  PET scan  results from May 27, 2024 confirmed hypermetabolism of lesion, but no other evidence of disease.  Biopsy confirms malignancy.  After a lengthy discussion with the patient and his daughters, he wishes to proceed with treatment this time around and the recommended weekly carboplatin  and Taxol  along with concurrent XRT followed by possible surgical resection and a referral has been sent to [facility].  Proceed with cycle 1 of weekly carboplatin  and Taxol  today.  Continue daily XRT.  Return to clinic in 1 week for further evaluation and consideration of cycle 2.   Hyponatremia: Mild, monitor. Leukocytosis: Likely reactive, monitor.  I spent a total of 30 minutes reviewing chart data, face-to-face evaluation with the patient, counseling and coordination of care as detailed above.   Patient expressed understanding and was in agreement with this plan. He also understands that He can call clinic at any time with any questions, concerns, or complaints.    Cancer Staging  Esophageal cancer [facility]) Staging form: Esophagus - Adenocarcinoma, AJCC 8th Edition - Clinical stage from 06/27/2024: Stage Unknown (cTX, cN0, cM0, G3) - Signed by Jacobo Evalene PARAS, MD on 06/27/2024 Stage prefix: Initial diagnosis Total positive nodes: 0 Histologic grading system: 3 grade system   Evalene PARAS Jacobo, MD   08/15/2024 9:38 AM

## 2024-08-15 NOTE — Patient Instructions (Signed)
 [facility] - A DEPT OF [facility]  Discharge Instructions: Thank you for choosing [facility] to provide your oncology and hematology care.  If you have a lab appointment with the Cancer Center, please go directly to the Cancer Center and check in at the registration area.  Wear comfortable clothing and clothing appropriate for easy access to any Portacath or PICC line.   We strive to give you quality time with your provider. You may need to reschedule your appointment if you arrive late (15 or more minutes).  Arriving late affects you and other patients whose appointments are after yours.  Also, if you miss three or more appointments without notifying the office, you may be dismissed from the clinic at the provider's discretion.      For prescription refill requests, have your pharmacy contact our office and allow 72 hours for refills to be completed.    Today you received the following chemotherapy and/or immunotherapy agents TAXOL and CARBOPLATIN       To help prevent nausea and vomiting after your treatment, we encourage you to take your nausea medication as directed.  BELOW ARE SYMPTOMS THAT SHOULD BE REPORTED IMMEDIATELY: *FEVER GREATER THAN 100.4 F (38 C) OR HIGHER *CHILLS OR SWEATING *NAUSEA AND VOMITING THAT IS NOT CONTROLLED WITH YOUR NAUSEA MEDICATION *UNUSUAL SHORTNESS OF BREATH *UNUSUAL BRUISING OR BLEEDING *URINARY PROBLEMS (pain or burning when urinating, or frequent urination) *BOWEL PROBLEMS (unusual diarrhea, constipation, pain near the anus) TENDERNESS IN MOUTH AND THROAT WITH OR WITHOUT PRESENCE OF ULCERS (sore throat, sores in mouth, or a toothache) UNUSUAL RASH, SWELLING OR PAIN  UNUSUAL VAGINAL DISCHARGE OR ITCHING   Items with * indicate a potential emergency and should be followed up as soon as possible or go to the Emergency Department if any problems should occur.  Please show the CHEMOTHERAPY ALERT CARD or  IMMUNOTHERAPY ALERT CARD at check-in to the Emergency Department and triage nurse.  Should you have questions after your visit or need to cancel or reschedule your appointment, please contact [facility] - A DEPT OF [facility]  [phone] and follow the prompts.  Office hours are 8:00 a.m. to 4:30 p.m. Monday - Friday. Please note that voicemails left after 4:00 p.m. may not be returned until the following business day.  We are closed weekends and major holidays. You have access to a nurse at all times for urgent questions. Please call the main number to the clinic [phone] and follow the prompts.  For any non-urgent questions, you may also contact your provider using MyChart. We now offer e-Visits for anyone 83 and older to request care online for non-urgent symptoms. For details visit [email or web address].   Also download the MyChart app! Go to the app store, search "MyChart", open the app, select [facility], and log in with your MyChart username and password.  Paclitaxel Injection What is this medication? PACLITAXEL (PAK li TAX el) treats some types of cancer. It works by slowing down the growth of cancer cells. This medicine may be used for other purposes; ask your health care provider or pharmacist if you have questions. COMMON BRAND NAME(S): Onxol, Taxol What should I tell my care team before I take this medication? They need to know if you have any of these conditions: Heart disease Liver disease Low white blood cell levels An unusual or allergic reaction to paclitaxel, other medications, foods, dyes, or preservatives  If you or your partner are pregnant or trying to get pregnant Breast-feeding How should I use this medication? This medication is injected into a vein. It is given by your care team in a hospital or clinic setting. Talk to your care team about the use of this medication in children. While it may be given to children for selected  conditions, precautions do apply. Overdosage: If you think you have taken too much of this medicine contact a poison control center or emergency room at once. NOTE: This medicine is only for you. Do not share this medicine with others. What if I miss a dose? Keep appointments for follow-up doses. It is important not to miss your dose. Call your care team if you are unable to keep an appointment. What may interact with this medication? Do not take this medication with any of the following: Live virus vaccines Other medications may affect the way this medication works. Talk with your care team about all of the medications you take. They may suggest changes to your treatment plan to lower the risk of side effects and to make sure your medications work as intended. This list may not describe all possible interactions. Give your health care provider a list of all the medicines, herbs, non-prescription drugs, or dietary supplements you use. Also tell them if you smoke, drink alcohol, or use illegal drugs. Some items may interact with your medicine. What should I watch for while using this medication? Your condition will be monitored carefully while you are receiving this medication. You may need blood work while taking this medication. This medication may make you feel generally unwell. This is not uncommon as chemotherapy can affect healthy cells as well as cancer cells. Report any side effects. Continue your course of treatment even though you feel ill unless your care team tells you to stop. This medication can cause serious allergic reactions. To reduce the risk, your care team may give you other medications to take before receiving this one. Be sure to follow the directions from your care team. This medication may increase your risk of getting an infection. Call your care team for advice if you get a fever, chills, sore throat, or other symptoms of a cold or flu. Do not treat yourself. Try to avoid  being around people who are sick. This medication may increase your risk to bruise or bleed. Call your care team if you notice any unusual bleeding. Be careful brushing or flossing your teeth or using a toothpick because you may get an infection or bleed more easily. If you have any dental work done, tell your dentist you are receiving this medication. Talk to your care team if you may be pregnant. Serious birth defects can occur if you take this medication during pregnancy. Talk to your care team before breastfeeding. Changes to your treatment plan may be needed. What side effects may I notice from receiving this medication? Side effects that you should report to your care team as soon as possible: Allergic reactions--skin rash, itching, hives, swelling of the face, lips, tongue, or throat Heart rhythm changes--fast or irregular heartbeat, dizziness, feeling faint or lightheaded, chest pain, trouble breathing Increase in blood pressure Infection--fever, chills, cough, sore throat, wounds that don't heal, pain or trouble when passing urine, general feeling of discomfort or being unwell Low blood pressure--dizziness, feeling faint or lightheaded, blurry vision Low red blood cell level--unusual weakness or fatigue, dizziness, headache, trouble breathing Painful swelling, warmth, or redness of the skin, blisters  or sores at the infusion site Pain, tingling, or numbness in the hands or feet Slow heartbeat--dizziness, feeling faint or lightheaded, confusion, trouble breathing, unusual weakness or fatigue Unusual bruising or bleeding Side effects that usually do not require medical attention (report to your care team if they continue or are bothersome): Diarrhea Hair loss Joint pain Loss of appetite Muscle pain Nausea Vomiting This list may not describe all possible side effects. Call your doctor for medical advice about side effects. You may report side effects to FDA at 1-800-FDA-1088. Where  should I keep my medication? This medication is given in a hospital or clinic. It will not be stored at home. NOTE: This sheet is a summary. It may not cover all possible information. If you have questions about this medicine, talk to your doctor, pharmacist, or health care provider.  2024 Elsevier/Gold Standard (2022-04-29 00:00:00)  Carboplatin Injection What is this medication? CARBOPLATIN (KAR boe pla tin) treats some types of cancer. It works by slowing down the growth of cancer cells. This medicine may be used for other purposes; ask your health care provider or pharmacist if you have questions. COMMON BRAND NAME(S): Paraplatin What should I tell my care team before I take this medication? They need to know if you have any of these conditions: Blood disorders Hearing problems Kidney disease Recent or ongoing radiation therapy An unusual or allergic reaction to carboplatin, cisplatin, other medications, foods, dyes, or preservatives Pregnant or trying to get pregnant Breast-feeding How should I use this medication? This medication is injected into a vein. It is given by your care team in a hospital or clinic setting. Talk to your care team about the use of this medication in children. Special care may be needed. Overdosage: If you think you have taken too much of this medicine contact a poison control center or emergency room at once. NOTE: This medicine is only for you. Do not share this medicine with others. What if I miss a dose? Keep appointments for follow-up doses. It is important not to miss your dose. Call your care team if you are unable to keep an appointment. What may interact with this medication? Medications for seizures Some antibiotics, such as amikacin, gentamicin, neomycin, streptomycin, tobramycin Vaccines This list may not describe all possible interactions. Give your health care provider a list of all the medicines, herbs, non-prescription drugs, or dietary  supplements you use. Also tell them if you smoke, drink alcohol, or use illegal drugs. Some items may interact with your medicine. What should I watch for while using this medication? Your condition will be monitored carefully while you are receiving this medication. You may need blood work while taking this medication. This medication may make you feel generally unwell. This is not uncommon, as chemotherapy can affect healthy cells as well as cancer cells. Report any side effects. Continue your course of treatment even though you feel ill unless your care team tells you to stop. In some cases, you may be given additional medications to help with side effects. Follow all directions for their use. This medication may increase your risk of getting an infection. Call your care team for advice if you get a fever, chills, sore throat, or other symptoms of a cold or flu. Do not treat yourself. Try to avoid being around people who are sick. Avoid taking medications that contain aspirin, acetaminophen, ibuprofen, naproxen, or ketoprofen unless instructed by your care team. These medications may hide a fever. Be careful brushing or flossing your  teeth or using a toothpick because you may get an infection or bleed more easily. If you have any dental work done, tell your dentist you are receiving this medication. Talk to your care team if you wish to become pregnant or think you might be pregnant. This medication can cause serious birth defects. Talk to your care team about effective forms of contraception. Do not breast-feed while taking this medication. What side effects may I notice from receiving this medication? Side effects that you should report to your care team as soon as possible: Allergic reactions--skin rash, itching, hives, swelling of the face, lips, tongue, or throat Infection--fever, chills, cough, sore throat, wounds that don't heal, pain or trouble when passing urine, general feeling of  discomfort or being unwell Low red blood cell level--unusual weakness or fatigue, dizziness, headache, trouble breathing Pain, tingling, or numbness in the hands or feet, muscle weakness, change in vision, confusion or trouble speaking, loss of balance or coordination, trouble walking, seizures Unusual bruising or bleeding Side effects that usually do not require medical attention (report to your care team if they continue or are bothersome): Hair loss Nausea Unusual weakness or fatigue Vomiting This list may not describe all possible side effects. Call your doctor for medical advice about side effects. You may report side effects to FDA at 1-800-FDA-1088. Where should I keep my medication? This medication is given in a hospital or clinic. It will not be stored at home. NOTE: This sheet is a summary. It may not cover all possible information. If you have questions about this medicine, talk to your doctor, pharmacist, or health care provider.  2024 Elsevier/Gold Standard (2022-04-01 00:00:00)

## 2024-08-15 NOTE — Progress Notes (Signed)
 Patient has been having a lot of gas build up, it can get very uncomfortable.

## 2024-08-16 ENCOUNTER — Ambulatory Visit
Admission: RE | Admit: 2024-08-16 | Discharge: 2024-08-16 | Disposition: A | Discharge status: Home / Self Care | Payer: MEDICAID | Source: Ambulatory Visit | Attending: Radiation Oncology | Admitting: Radiation Oncology

## 2024-08-16 ENCOUNTER — Other Ambulatory Visit: Payer: Self-pay

## 2024-08-16 DIAGNOSIS — Z5111 Encounter for antineoplastic chemotherapy: Secondary | ICD-10-CM | POA: Diagnosis not present

## 2024-08-16 LAB — RAD ONC ARIA SESSION SUMMARY
Course Elapsed Days: 5
Plan Fractions Treated to Date: 4
Plan Prescribed Dose Per Fraction: 1.8 Gy
Plan Total Fractions Prescribed: 30
Plan Total Prescribed Dose: 54 Gy
Reference Point Dosage Given to Date: 7.2 Gy
Reference Point Session Dosage Given: 3.6 Gy
Session Number: 3

## 2024-08-17 ENCOUNTER — Inpatient Hospital Stay: Payer: MEDICAID

## 2024-08-17 ENCOUNTER — Ambulatory Visit
Admission: RE | Admit: 2024-08-17 | Discharge: 2024-08-17 | Disposition: A | Discharge status: Home / Self Care | Payer: MEDICAID | Source: Ambulatory Visit | Attending: Radiation Oncology | Admitting: Radiation Oncology

## 2024-08-17 ENCOUNTER — Other Ambulatory Visit: Payer: Self-pay

## 2024-08-17 DIAGNOSIS — Z5111 Encounter for antineoplastic chemotherapy: Secondary | ICD-10-CM | POA: Diagnosis not present

## 2024-08-17 LAB — RAD ONC ARIA SESSION SUMMARY
Course Elapsed Days: 6
Plan Fractions Treated to Date: 5
Plan Prescribed Dose Per Fraction: 1.8 Gy
Plan Total Fractions Prescribed: 30
Plan Total Prescribed Dose: 54 Gy
Reference Point Dosage Given to Date: 9 Gy
Reference Point Session Dosage Given: 1.8 Gy
Session Number: 4

## 2024-08-18 ENCOUNTER — Other Ambulatory Visit: Payer: Self-pay

## 2024-08-18 ENCOUNTER — Ambulatory Visit
Admission: RE | Admit: 2024-08-18 | Discharge: 2024-08-18 | Disposition: A | Discharge status: Home / Self Care | Payer: MEDICAID | Source: Ambulatory Visit | Attending: Radiation Oncology | Admitting: Radiation Oncology

## 2024-08-18 DIAGNOSIS — Z5111 Encounter for antineoplastic chemotherapy: Secondary | ICD-10-CM | POA: Diagnosis not present

## 2024-08-18 LAB — RAD ONC ARIA SESSION SUMMARY
Course Elapsed Days: 7
Plan Fractions Treated to Date: 6
Plan Prescribed Dose Per Fraction: 1.8 Gy
Plan Total Fractions Prescribed: 30
Plan Total Prescribed Dose: 54 Gy
Reference Point Dosage Given to Date: 10.8 Gy
Reference Point Session Dosage Given: 1.8 Gy
Session Number: 5

## 2024-08-19 ENCOUNTER — Ambulatory Visit
Admission: RE | Admit: 2024-08-19 | Discharge: 2024-08-19 | Disposition: A | Discharge status: Home / Self Care | Payer: MEDICAID | Source: Ambulatory Visit | Attending: Radiation Oncology | Admitting: Radiation Oncology

## 2024-08-19 ENCOUNTER — Other Ambulatory Visit: Payer: Self-pay

## 2024-08-19 DIAGNOSIS — Z5111 Encounter for antineoplastic chemotherapy: Secondary | ICD-10-CM | POA: Diagnosis not present

## 2024-08-19 LAB — RAD ONC ARIA SESSION SUMMARY
Course Elapsed Days: 8
Plan Fractions Treated to Date: 7
Plan Prescribed Dose Per Fraction: 1.8 Gy
Plan Total Fractions Prescribed: 30
Plan Total Prescribed Dose: 54 Gy
Reference Point Dosage Given to Date: 12.6 Gy
Reference Point Session Dosage Given: 1.8 Gy
Session Number: 6

## 2024-08-23 ENCOUNTER — Encounter: Payer: Self-pay | Admitting: Oncology

## 2024-08-23 ENCOUNTER — Inpatient Hospital Stay: Payer: MEDICAID | Attending: Oncology

## 2024-08-23 ENCOUNTER — Inpatient Hospital Stay (HOSPITAL_BASED_OUTPATIENT_CLINIC_OR_DEPARTMENT_OTHER): Payer: MEDICAID | Admitting: Oncology

## 2024-08-23 ENCOUNTER — Other Ambulatory Visit: Payer: Self-pay

## 2024-08-23 ENCOUNTER — Other Ambulatory Visit: Payer: Self-pay | Admitting: *Deleted

## 2024-08-23 ENCOUNTER — Ambulatory Visit
Admission: RE | Admit: 2024-08-23 | Discharge: 2024-08-23 | Disposition: A | Discharge status: Home / Self Care | Payer: MEDICAID | Source: Ambulatory Visit | Attending: Radiation Oncology | Admitting: Radiation Oncology

## 2024-08-23 ENCOUNTER — Inpatient Hospital Stay: Payer: MEDICAID

## 2024-08-23 VITALS — BP 118/84 | HR 74 | Temp 98.9°F | Resp 18 | Ht 72.0 in | Wt 271.0 lb

## 2024-08-23 VITALS — BP 135/83 | HR 76 | Temp 97.6°F | Resp 19

## 2024-08-23 DIAGNOSIS — I10 Essential (primary) hypertension: Secondary | ICD-10-CM | POA: Insufficient documentation

## 2024-08-23 DIAGNOSIS — Z7982 Long term (current) use of aspirin: Secondary | ICD-10-CM | POA: Insufficient documentation

## 2024-08-23 DIAGNOSIS — B182 Chronic viral hepatitis C: Secondary | ICD-10-CM | POA: Insufficient documentation

## 2024-08-23 DIAGNOSIS — Z8719 Personal history of other diseases of the digestive system: Secondary | ICD-10-CM | POA: Insufficient documentation

## 2024-08-23 DIAGNOSIS — Z8673 Personal history of transient ischemic attack (TIA), and cerebral infarction without residual deficits: Secondary | ICD-10-CM | POA: Insufficient documentation

## 2024-08-23 DIAGNOSIS — Z79899 Other long term (current) drug therapy: Secondary | ICD-10-CM | POA: Insufficient documentation

## 2024-08-23 DIAGNOSIS — I2510 Atherosclerotic heart disease of native coronary artery without angina pectoris: Secondary | ICD-10-CM | POA: Insufficient documentation

## 2024-08-23 DIAGNOSIS — Z5111 Encounter for antineoplastic chemotherapy: Secondary | ICD-10-CM | POA: Insufficient documentation

## 2024-08-23 DIAGNOSIS — C155 Malignant neoplasm of lower third of esophagus: Secondary | ICD-10-CM

## 2024-08-23 DIAGNOSIS — Z510 Encounter for antineoplastic radiation therapy: Secondary | ICD-10-CM | POA: Insufficient documentation

## 2024-08-23 DIAGNOSIS — Z7952 Long term (current) use of systemic steroids: Secondary | ICD-10-CM | POA: Insufficient documentation

## 2024-08-23 DIAGNOSIS — Z9221 Personal history of antineoplastic chemotherapy: Secondary | ICD-10-CM | POA: Insufficient documentation

## 2024-08-23 DIAGNOSIS — F17210 Nicotine dependence, cigarettes, uncomplicated: Secondary | ICD-10-CM | POA: Insufficient documentation

## 2024-08-23 DIAGNOSIS — J449 Chronic obstructive pulmonary disease, unspecified: Secondary | ICD-10-CM | POA: Insufficient documentation

## 2024-08-23 DIAGNOSIS — F1290 Cannabis use, unspecified, uncomplicated: Secondary | ICD-10-CM | POA: Insufficient documentation

## 2024-08-23 DIAGNOSIS — Z7951 Long term (current) use of inhaled steroids: Secondary | ICD-10-CM | POA: Insufficient documentation

## 2024-08-23 DIAGNOSIS — Z806 Family history of leukemia: Secondary | ICD-10-CM | POA: Insufficient documentation

## 2024-08-23 DIAGNOSIS — E785 Hyperlipidemia, unspecified: Secondary | ICD-10-CM | POA: Insufficient documentation

## 2024-08-23 DIAGNOSIS — Z7984 Long term (current) use of oral hypoglycemic drugs: Secondary | ICD-10-CM | POA: Insufficient documentation

## 2024-08-23 DIAGNOSIS — F319 Bipolar disorder, unspecified: Secondary | ICD-10-CM | POA: Insufficient documentation

## 2024-08-23 DIAGNOSIS — Z803 Family history of malignant neoplasm of breast: Secondary | ICD-10-CM | POA: Insufficient documentation

## 2024-08-23 DIAGNOSIS — E119 Type 2 diabetes mellitus without complications: Secondary | ICD-10-CM | POA: Insufficient documentation

## 2024-08-23 LAB — RAD ONC ARIA SESSION SUMMARY
Course Elapsed Days: 12
Plan Fractions Treated to Date: 8
Plan Prescribed Dose Per Fraction: 1.8 Gy
Plan Total Fractions Prescribed: 30
Plan Total Prescribed Dose: 54 Gy
Reference Point Dosage Given to Date: 14.4 Gy
Reference Point Session Dosage Given: 1.8 Gy
Session Number: 7

## 2024-08-23 LAB — CBC WITH DIFFERENTIAL (CANCER CENTER ONLY)
Abs Immature Granulocytes: 0.07 K/uL (ref 0.00–0.07)
Basophils Absolute: 0.1 K/uL (ref 0.0–0.1)
Basophils Relative: 1 %
Eosinophils Absolute: 0.3 K/uL (ref 0.0–0.5)
Eosinophils Relative: 4 %
HCT: 43.4 % (ref 39.0–52.0)
Hemoglobin: 13.8 g/dL (ref 13.0–17.0)
Immature Granulocytes: 1 %
Lymphocytes Relative: 22 %
Lymphs Abs: 1.8 K/uL (ref 0.7–4.0)
MCH: 27.2 pg (ref 26.0–34.0)
MCHC: 31.8 g/dL (ref 30.0–36.0)
MCV: 85.6 fL (ref 80.0–100.0)
Monocytes Absolute: 0.5 K/uL (ref 0.1–1.0)
Monocytes Relative: 6 %
Neutro Abs: 5.7 K/uL (ref 1.7–7.7)
Neutrophils Relative %: 66 %
Platelet Count: 228 K/uL (ref 150–400)
RBC: 5.07 MIL/uL (ref 4.22–5.81)
RDW: 13.2 % (ref 11.5–15.5)
WBC Count: 8.4 K/uL (ref 4.0–10.5)
nRBC: 0 % (ref 0.0–0.2)

## 2024-08-23 LAB — CMP (CANCER CENTER ONLY)
ALT: 14 U/L (ref 0–44)
AST: 15 U/L (ref 15–41)
Albumin: 3.8 g/dL (ref 3.5–5.0)
Alkaline Phosphatase: 70 U/L (ref 38–126)
Anion gap: 7 (ref 5–15)
BUN: 11 mg/dL (ref 8–23)
CO2: 28 mmol/L (ref 22–32)
Calcium: 8.7 mg/dL — ABNORMAL LOW (ref 8.9–10.3)
Chloride: 98 mmol/L (ref 98–111)
Creatinine: 0.93 mg/dL (ref 0.61–1.24)
GFR, Estimated: >60 mL/min (ref >60)
Glucose, Bld: 97 mg/dL (ref 70–99)
Potassium: 4.3 mmol/L (ref 3.5–5.1)
Sodium: 133 mmol/L — ABNORMAL LOW (ref 135–145)
Total Bilirubin: 0.4 mg/dL (ref 0.0–1.2)
Total Protein: 6.7 g/dL (ref 6.5–8.1)

## 2024-08-23 MED ORDER — SODIUM CHLORIDE 0.9 % IV SOLN
INTRAVENOUS | Status: DC
  Filled 2024-08-23: qty 250

## 2024-08-23 MED ORDER — PALONOSETRON HCL INJECTION 0.25 MG/5ML
0.2500 mg | Freq: Once | INTRAVENOUS | Status: AC
  Administered 2024-08-23: 0.25 mg via INTRAVENOUS
  Filled 2024-08-23: qty 5

## 2024-08-23 MED ORDER — SUCRALFATE 1 G PO TABS: 1.0000 g | ORAL_TABLET | Freq: Three times a day (TID) | ORAL | 0 refills | Status: AC

## 2024-08-23 MED ORDER — DEXAMETHASONE SODIUM PHOSPHATE 10 MG/ML IJ SOLN
10.0000 mg | Freq: Once | INTRAMUSCULAR | Status: AC
  Administered 2024-08-23: 10 mg via INTRAVENOUS
  Filled 2024-08-23: qty 1

## 2024-08-23 MED ORDER — SODIUM CHLORIDE 0.9 % IV SOLN
50.0000 mg/m2 | Freq: Once | INTRAVENOUS | Status: AC
  Administered 2024-08-23: 126 mg via INTRAVENOUS
  Filled 2024-08-23: qty 21

## 2024-08-23 MED ORDER — DIPHENHYDRAMINE HCL 50 MG/ML IJ SOLN
25.0000 mg | Freq: Once | INTRAMUSCULAR | Status: AC
  Administered 2024-08-23: 25 mg via INTRAVENOUS
  Filled 2024-08-23: qty 1

## 2024-08-23 MED ORDER — FAMOTIDINE IN NACL 20-0.9 MG/50ML-% IV SOLN
20.0000 mg | Freq: Once | INTRAVENOUS | Status: AC
  Administered 2024-08-23: 20 mg via INTRAVENOUS
  Filled 2024-08-23: qty 50

## 2024-08-23 MED ORDER — SODIUM CHLORIDE 0.9 % IV SOLN
300.0000 mg | Freq: Once | INTRAVENOUS | Status: AC
  Administered 2024-08-23: 300 mg via INTRAVENOUS
  Filled 2024-08-23: qty 30

## 2024-08-23 NOTE — Progress Notes (Unsigned)
 Patient is still having the bloated feeling, Dr. Camelia just prescribed him something that is supposed to help him.

## 2024-08-24 ENCOUNTER — Other Ambulatory Visit: Payer: Self-pay

## 2024-08-24 ENCOUNTER — Ambulatory Visit
Admission: RE | Admit: 2024-08-24 | Discharge: 2024-08-24 | Disposition: A | Discharge status: Home / Self Care | Payer: MEDICAID | Source: Ambulatory Visit | Attending: Radiation Oncology | Admitting: Radiation Oncology

## 2024-08-24 ENCOUNTER — Encounter: Payer: Self-pay | Admitting: Oncology

## 2024-08-24 ENCOUNTER — Inpatient Hospital Stay: Payer: MEDICAID

## 2024-08-24 DIAGNOSIS — Z510 Encounter for antineoplastic radiation therapy: Secondary | ICD-10-CM | POA: Diagnosis not present

## 2024-08-24 LAB — RAD ONC ARIA SESSION SUMMARY
Course Elapsed Days: 13
Plan Fractions Treated to Date: 9
Plan Prescribed Dose Per Fraction: 1.8 Gy
Plan Total Fractions Prescribed: 30
Plan Total Prescribed Dose: 54 Gy
Reference Point Dosage Given to Date: 16.2 Gy
Reference Point Session Dosage Given: 1.8 Gy
Session Number: 8

## 2024-08-24 NOTE — Progress Notes (Signed)
 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  INTERVAL HISTORY: Patient returns to clinic today for further evaluation and consideration of cycle 2 of weekly carboplatin  along with his daily XRT.  He tolerated his first treatment well without significant side effects.  He continues to complain of bloating, but otherwise feels well.  He does not complain of any dysphagia or difficulty swallowing.  He denies any pain.  He has no neurologic complaints.  He denies any recent fevers or illnesses.  He has a good appetite and denies weight loss.  He has no chest pain, shortness of breath, cough, or hemoptysis.  He denies any nausea, vomiting, constipation, or diarrhea.  He has no urinary complaints.  Patient offers no further specific complaints today.    REVIEW OF SYSTEMS:   Review of Systems  Constitutional: Negative.  Negative for fever, malaise/fatigue and weight loss.  Respiratory: Negative.  Negative for cough, hemoptysis and shortness of breath.   Cardiovascular: Negative.  Negative for chest pain and leg swelling.  Gastrointestinal: Negative.  Negative for abdominal pain.  Genitourinary: Negative.  Negative for dysuria.  Musculoskeletal: Negative.  Negative for back pain.  Skin: Negative.  Negative for rash.  Neurological: Negative.  Negative for dizziness, focal weakness, weakness and headaches.  Psychiatric/Behavioral: Negative.  The patient is not nervous/anxious.     As per HPI. Otherwise, a complete review of systems is negative.  PAST MEDICAL HISTORY: Past Medical History:   Diagnosis Date   Anxiety    Barrett esophagus    Bipolar disorder (HCC)    Cancer (HCC)    Chronic hepatitis C (HCC)    COPD (chronic obstructive pulmonary disease) (HCC)    Coronary artery disease    Depression    Diabetes mellitus without complication (HCC)    Dyslipidemia    GERD (gastroesophageal reflux disease)    Hepatitis    Hypertension    Leukocytosis    Pre-diabetes    Primary malignant neoplasm (HCC)    Stroke (HCC) 2014   mini-stroke per patient    PAST SURGICAL HISTORY: Past Surgical History:  Procedure Laterality Date   ANGIOPLASTY     APPENDECTOMY     BIOPSY  12/28/2023   Procedure: BIOPSY;  Surgeon: Onita Elspeth Sharper, DO;  Location: [facility] ENDOSCOPY;  Service: Gastroenterology;;   CARDIAC CATHETERIZATION     CHOLECYSTECTOMY     COLONOSCOPY WITH PROPOFOL  N/A 12/28/2023   Procedure: COLONOSCOPY WITH PROPOFOL ;  Surgeon: Onita Elspeth Sharper, DO;  Location: [facility] ENDOSCOPY;  Service: Gastroenterology;  Laterality: N/A;   CORONARY ANGIOPLASTY     ESOPHAGOGASTRODUODENOSCOPY N/A 12/28/2023   Procedure: ESOPHAGOGASTRODUODENOSCOPY (EGD);  Surgeon: Onita Elspeth Sharper, DO;  Location: [facility] ENDOSCOPY;  Service: Gastroenterology;  Laterality: N/A;   HEMOSTASIS CLIP PLACEMENT  12/28/2023   Procedure: HEMOSTASIS CLIP PLACEMENT;  Surgeon: Onita Elspeth Sharper, DO;  Location: [facility] ENDOSCOPY;  Service: Gastroenterology;;   POLYPECTOMY  12/28/2023   Procedure: POLYPECTOMY;  Surgeon: Onita Elspeth Sharper, DO;  Location: [facility] ENDOSCOPY;  Service: Gastroenterology;;   WRIST SURGERY Right    age 63    FAMILY HISTORY: Family History  Problem Relation Age of Onset   Stroke Father  Leukemia Father    Breast cancer Sister     ADVANCED DIRECTIVES (Y/N):  N  HEALTH MAINTENANCE: Social History   Tobacco Use   Smoking status: Every Day    Current packs/day: 1.50    Types: Cigarettes   Smokeless tobacco: Never  Vaping Use   Vaping status: Never Used  Substance Use  Topics   Alcohol use: Yes    Comment: occasional   Drug use: Yes    Types: Marijuana     Colonoscopy:  PAP:  Bone density:  Lipid panel:  Allergies  Allergen Reactions   Fluoxetine Swelling    Anxiety, itching   Hydrocodone -Acetaminophen  Itching   Mirtazapine  Other (See Comments)    nightmares   Hydrochlorothiazide  Other (See Comments)    LEG CRAMPING HYPOMAGNESEMIA   Tramadol Other (See Comments)   Gabapentin  Other (See Comments)    Nightmares     Current Outpatient Medications  Medication Sig Dispense Refill   albuterol  (VENTOLIN  HFA) 108 (90 Base) MCG/ACT inhaler Inhale 2 puffs into the lungs every 6 (six) hours as needed for wheezing or shortness of breath. 18 g 1   alum & mag hydroxide-simeth (MAALOX PLUS) 400-400-40 MG/5ML suspension Take 10 mLs by mouth every 6 (six) hours as needed for indigestion. 355 mL 0   amLODipine  (NORVASC ) 10 MG tablet Take 10 mg by mouth daily.     aspirin  81 MG chewable tablet Chew 1 tablet (81 mg total) by mouth daily. 30 tablet 1   atorvastatin  (LIPITOR) 40 MG tablet Take 1 tablet (40 mg total) by mouth daily. 30 tablet 1   busPIRone  (BUSPAR ) 10 MG tablet Take 2 tablets (20 mg total) by mouth 3 (three) times daily. 180 tablet 1   clonazePAM  (KLONOPIN ) 0.5 MG tablet Take 1 tablet (0.5 mg total) by mouth 2 (two) times daily as needed (Anxiety). 60 tablet 1   cyclobenzaprine  (FLEXERIL ) 10 MG tablet Take 1 tablet (10 mg total) by mouth 2 (two) times daily as needed for muscle spasms. 20 tablet 0   escitalopram  (LEXAPRO ) 20 MG tablet Take 1 tablet (20 mg total) by mouth daily. 30 tablet 1   famotidine  (PEPCID ) 20 MG tablet Take 20 mg by mouth 2 (two) times daily.     fluticasone-salmeterol (ADVAIR ) 500-50 MCG/ACT AEPB Inhale 1 puff into the lungs in the morning and at bedtime.     hydrOXYzine  (ATARAX /VISTARIL ) 25 MG tablet Take 1 tablet (25 mg total) by mouth 3 (three) times daily as needed for anxiety. 90 tablet 1   ibuprofen  (ADVIL ) 600 MG  tablet Take 600 mg by mouth 3 (three) times daily.     lisinopril  (ZESTRIL ) 20 MG tablet Take 1 tablet (20 mg total) by mouth daily. 30 tablet 1   magnesium  oxide (MAG-OX) 400 (240 Mg) MG tablet Take 400 mg by mouth 2 (two) times daily. TAKE 4 TABLETS BY MOUTH 2 TIMES DAILY     metFORMIN  (GLUCOPHAGE ) 1000 MG tablet Take 1 tablet (1,000 mg total) by mouth 2 (two) times daily with a meal. 60 tablet 1   mometasone -formoterol  (DULERA ) 200-5 MCG/ACT AERO Inhale 2 puffs into the lungs 2 (two) times daily. 1 each 1   ondansetron  (ZOFRAN ) 8 MG tablet Take 1 tablet (8 mg total) by mouth every 8 (eight) hours as needed for nausea or vomiting. Start on the third day after chemotherapy. 60 tablet 1   pantoprazole  (PROTONIX ) 40 MG tablet Take 1 tablet (40 mg total) by mouth daily. 30 tablet 1  pramipexole (MIRAPEX) 0.5 MG tablet Take 0.5 mg by mouth at bedtime.     predniSONE  (DELTASONE ) 10 MG tablet Take 1 tablet (10 mg total) by mouth daily. Day 1-2: Take 50 mg  ( 5 pills) Day 3-4 : Take 40 mg (4pills) Day 5-6: Take 30 mg (3 pills) Day 7-8:  Take 20 mg (2 pills) Day 9:  Take 10mg  (1 pill) 29 tablet 0   prochlorperazine  (COMPAZINE ) 10 MG tablet Take 1 tablet (10 mg total) by mouth every 6 (six) hours as needed for nausea or vomiting. 60 tablet 1   QUEtiapine  (SEROQUEL  XR) 400 MG 24 hr tablet Take 1 tablet (400 mg total) by mouth at bedtime. 30 tablet 1   rOPINIRole (REQUIP XL) 2 MG 24 hr tablet Take by mouth.     sucralfate  (CARAFATE ) 1 g tablet Take 1 tablet (1 g total) by mouth 3 (three) times daily before meals. Dissolve tablet in 4 table spoons of warm water swish and swallow 30 minutes before meals. 90 tablet 0   tiotropium (SPIRIVA) 18 MCG inhalation capsule Place 18 mcg into inhaler and inhale daily.     zolpidem  (AMBIEN ) 10 MG tablet Take 1 tablet (10 mg total) by mouth at bedtime as needed for sleep. 30 tablet 1   No current facility-administered medications for this visit.    OBJECTIVE: Vitals:    08/23/24 1153  BP: 118/84  Pulse: 74  Resp: 18  Temp: 98.9 F (37.2 C)  SpO2: 97%     Body mass index is 36.75 kg/m.    ECOG FS:0 - Asymptomatic  General: Well-developed, well-nourished, no acute distress. Eyes: Pink conjunctiva, anicteric sclera. HEENT: Normocephalic, moist mucous membranes. Lungs: No audible wheezing or coughing. Heart: Regular rate and rhythm. Abdomen: Soft, nontender, no obvious distention. Musculoskeletal: No edema, cyanosis, or clubbing. Neuro: Alert, answering all questions appropriately. Cranial nerves grossly intact. Skin: No rashes or petechiae noted. Psych: Normal affect.  LAB RESULTS:  Lab Results  Component Value Date   NA 133 (L) 08/23/2024   K 4.3 08/23/2024   CL 98 08/23/2024   CO2 28 08/23/2024   GLUCOSE 97 08/23/2024   BUN 11 08/23/2024   CREATININE 0.93 08/23/2024   CALCIUM  8.7 (L) 08/23/2024   PROT 6.7 08/23/2024   ALBUMIN 3.8 08/23/2024   AST 15 08/23/2024   ALT 14 08/23/2024   ALKPHOS 70 08/23/2024   BILITOT 0.4 08/23/2024   GFRNONAA >60 08/23/2024   GFRAA >60 09/05/2020    Lab Results  Component Value Date   WBC 8.4 08/23/2024   NEUTROABS 5.7 08/23/2024   HGB 13.8 08/23/2024   HCT 43.4 08/23/2024   MCV 85.6 08/23/2024   PLT 228 08/23/2024     STUDIES: No results found.   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  Unclear if this is recurrent disease or a second primary.  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  Despite recommendation, patient declined surgery or any other treatments at that time.  He had been monitored at  [facility] with frequent endoscopies without evidence of recurrent or progression of disease.  CT scan results from May 24, 2024 reviewed independently  with a gastroesophageal junction mass just above the celiac trunk measuring 3.4 x 3.6 x 4.3 cm.  There is also central calcification reported.  This was not present at his most recent imaging 7 years ago in 2018.  PET scan results from May 27, 2024 confirmed hypermetabolism of lesion, but no other evidence of disease.  Biopsy confirmed malignancy.  After a lengthy discussion with the patient and his daughters, he wishes to proceed with treatment this time around and the recommended weekly carboplatin  and Taxol  along with concurrent XRT followed by possible surgical resection and a referral has been sent to [facility].  Proceed with cycle 2 of weekly carboplatin  and Taxol  today.  Continue daily XRT.  Return to clinic in 1 week for further evaluation and consideration of cycle 3.  Hyponatremia: Chronic and unchanged.   Leukocytosis: Resolved. Bloating: Patient was given a prescription for Carafate  by radiation oncology.  Patient expressed understanding and was in agreement with this plan. He also understands that He can call clinic at any time with any questions, concerns, or complaints.    Cancer Staging  Esophageal cancer [facility]) Staging form: Esophagus - Adenocarcinoma, AJCC 8th Edition - Clinical stage from 06/27/2024: Stage Unknown (cTX, cN0, cM0, G3) - Signed by Jacobo Evalene PARAS, MD on 06/27/2024 Stage prefix: Initial diagnosis Total positive nodes: 0 Histologic grading system: 3 grade system   Evalene PARAS Jacobo, MD   08/24/2024 7:42 AM

## 2024-08-25 ENCOUNTER — Ambulatory Visit
Admission: RE | Admit: 2024-08-25 | Discharge: 2024-08-25 | Disposition: A | Discharge status: Home / Self Care | Payer: MEDICAID | Source: Ambulatory Visit | Attending: Radiation Oncology | Admitting: Radiation Oncology

## 2024-08-25 ENCOUNTER — Other Ambulatory Visit: Payer: Self-pay

## 2024-08-25 DIAGNOSIS — Z510 Encounter for antineoplastic radiation therapy: Secondary | ICD-10-CM | POA: Diagnosis not present

## 2024-08-25 LAB — RAD ONC ARIA SESSION SUMMARY
Course Elapsed Days: 14
Plan Fractions Treated to Date: 10
Plan Prescribed Dose Per Fraction: 1.8 Gy
Plan Total Fractions Prescribed: 30
Plan Total Prescribed Dose: 54 Gy
Reference Point Dosage Given to Date: 18 Gy
Reference Point Session Dosage Given: 1.8 Gy
Session Number: 9

## 2024-08-26 ENCOUNTER — Other Ambulatory Visit: Payer: Self-pay

## 2024-08-26 ENCOUNTER — Ambulatory Visit
Admission: RE | Admit: 2024-08-26 | Discharge: 2024-08-26 | Disposition: A | Discharge status: Home / Self Care | Payer: MEDICAID | Source: Ambulatory Visit | Attending: Radiation Oncology | Admitting: Radiation Oncology

## 2024-08-26 DIAGNOSIS — Z510 Encounter for antineoplastic radiation therapy: Secondary | ICD-10-CM | POA: Diagnosis not present

## 2024-08-26 LAB — RAD ONC ARIA SESSION SUMMARY
Course Elapsed Days: 15
Plan Fractions Treated to Date: 11
Plan Prescribed Dose Per Fraction: 1.8 Gy
Plan Total Fractions Prescribed: 30
Plan Total Prescribed Dose: 54 Gy
Reference Point Dosage Given to Date: 19.8 Gy
Reference Point Session Dosage Given: 1.8 Gy
Session Number: 10

## 2024-08-29 ENCOUNTER — Inpatient Hospital Stay: Payer: MEDICAID

## 2024-08-29 ENCOUNTER — Encounter: Payer: Self-pay | Admitting: Oncology

## 2024-08-29 ENCOUNTER — Inpatient Hospital Stay (HOSPITAL_BASED_OUTPATIENT_CLINIC_OR_DEPARTMENT_OTHER): Payer: MEDICAID | Admitting: Oncology

## 2024-08-29 ENCOUNTER — Other Ambulatory Visit: Payer: Self-pay

## 2024-08-29 ENCOUNTER — Ambulatory Visit
Admission: RE | Admit: 2024-08-29 | Discharge: 2024-08-29 | Disposition: A | Discharge status: Home / Self Care | Payer: MEDICAID | Source: Ambulatory Visit | Attending: Radiation Oncology | Admitting: Radiation Oncology

## 2024-08-29 VITALS — BP 118/79 | HR 74 | Temp 97.0°F | Resp 19

## 2024-08-29 VITALS — BP 118/78 | HR 76 | Temp 97.6°F | Resp 18 | Ht 72.0 in | Wt 270.0 lb

## 2024-08-29 DIAGNOSIS — C155 Malignant neoplasm of lower third of esophagus: Secondary | ICD-10-CM

## 2024-08-29 DIAGNOSIS — Z510 Encounter for antineoplastic radiation therapy: Secondary | ICD-10-CM | POA: Diagnosis not present

## 2024-08-29 LAB — RAD ONC ARIA SESSION SUMMARY
Course Elapsed Days: 18
Plan Fractions Treated to Date: 12
Plan Prescribed Dose Per Fraction: 1.8 Gy
Plan Total Fractions Prescribed: 30
Plan Total Prescribed Dose: 54 Gy
Reference Point Dosage Given to Date: 21.6 Gy
Reference Point Session Dosage Given: 1.8 Gy
Session Number: 11

## 2024-08-29 LAB — CMP (CANCER CENTER ONLY)
ALT: 12 U/L (ref 0–44)
AST: 13 U/L — ABNORMAL LOW (ref 15–41)
Albumin: 3.8 g/dL (ref 3.5–5.0)
Alkaline Phosphatase: 61 U/L (ref 38–126)
Anion gap: 8 (ref 5–15)
BUN: 14 mg/dL (ref 8–23)
CO2: 25 mmol/L (ref 22–32)
Calcium: 8.6 mg/dL — ABNORMAL LOW (ref 8.9–10.3)
Chloride: 102 mmol/L (ref 98–111)
Creatinine: 0.93 mg/dL (ref 0.61–1.24)
GFR, Estimated: >60 mL/min (ref >60)
Glucose, Bld: 94 mg/dL (ref 70–99)
Potassium: 4.5 mmol/L (ref 3.5–5.1)
Sodium: 135 mmol/L (ref 135–145)
Total Bilirubin: 0.5 mg/dL (ref 0.0–1.2)
Total Protein: 6.4 g/dL — ABNORMAL LOW (ref 6.5–8.1)

## 2024-08-29 LAB — CBC WITH DIFFERENTIAL (CANCER CENTER ONLY)
Abs Immature Granulocytes: 0.05 K/uL (ref 0.00–0.07)
Basophils Absolute: 0 K/uL (ref 0.0–0.1)
Basophils Relative: 1 %
Eosinophils Absolute: 0.2 K/uL (ref 0.0–0.5)
Eosinophils Relative: 4 %
HCT: 41.8 % (ref 39.0–52.0)
Hemoglobin: 13.5 g/dL (ref 13.0–17.0)
Immature Granulocytes: 1 %
Lymphocytes Relative: 18 %
Lymphs Abs: 1 K/uL (ref 0.7–4.0)
MCH: 27.4 pg (ref 26.0–34.0)
MCHC: 32.3 g/dL (ref 30.0–36.0)
MCV: 84.8 fL (ref 80.0–100.0)
Monocytes Absolute: 0.2 K/uL (ref 0.1–1.0)
Monocytes Relative: 4 %
Neutro Abs: 4 K/uL (ref 1.7–7.7)
Neutrophils Relative %: 72 %
Platelet Count: 169 K/uL (ref 150–400)
RBC: 4.93 MIL/uL (ref 4.22–5.81)
RDW: 13.4 % (ref 11.5–15.5)
WBC Count: 5.5 K/uL (ref 4.0–10.5)
nRBC: 0 % (ref 0.0–0.2)

## 2024-08-29 MED ORDER — FAMOTIDINE IN NACL 20-0.9 MG/50ML-% IV SOLN
20.0000 mg | Freq: Once | INTRAVENOUS | Status: AC
  Administered 2024-08-29: 20 mg via INTRAVENOUS
  Filled 2024-08-29: qty 50

## 2024-08-29 MED ORDER — SODIUM CHLORIDE 0.9 % IV SOLN
50.0000 mg/m2 | Freq: Once | INTRAVENOUS | Status: AC
  Administered 2024-08-29: 126 mg via INTRAVENOUS
  Filled 2024-08-29: qty 21

## 2024-08-29 MED ORDER — DIPHENHYDRAMINE HCL 50 MG/ML IJ SOLN
25.0000 mg | Freq: Once | INTRAMUSCULAR | Status: AC
  Administered 2024-08-29: 25 mg via INTRAVENOUS
  Filled 2024-08-29: qty 1

## 2024-08-29 MED ORDER — DEXAMETHASONE SODIUM PHOSPHATE 10 MG/ML IJ SOLN
10.0000 mg | Freq: Once | INTRAMUSCULAR | Status: AC
  Administered 2024-08-29: 10 mg via INTRAVENOUS
  Filled 2024-08-29: qty 1

## 2024-08-29 MED ORDER — SODIUM CHLORIDE 0.9 % IV SOLN
300.0000 mg | Freq: Once | INTRAVENOUS | Status: AC
  Administered 2024-08-29: 300 mg via INTRAVENOUS
  Filled 2024-08-29: qty 30

## 2024-08-29 MED ORDER — SODIUM CHLORIDE 0.9 % IV SOLN
INTRAVENOUS | Status: DC
  Filled 2024-08-29: qty 250

## 2024-08-29 MED ORDER — PALONOSETRON HCL INJECTION 0.25 MG/5ML
0.2500 mg | Freq: Once | INTRAVENOUS | Status: AC
  Administered 2024-08-29: 0.25 mg via INTRAVENOUS
  Filled 2024-08-29: qty 5

## 2024-08-29 NOTE — Progress Notes (Signed)
 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  INTERVAL HISTORY: Patient returns to clinic today for further evaluation and consideration of cycle 3 of weekly carboplatinum and Taxol  along with his daily XRT.  He reports persistent headache that has not helped with ibuprofen  as well as increased dysphagia, but admits he is not taking his Carafate  as prescribed.  He otherwise feels well.  He does not complain of any other pain today.  He has no other neurologic complaints.  He denies any recent fevers or illnesses.  He has a good appetite and denies weight loss.  He has no chest pain, shortness of breath, cough, or hemoptysis.  He denies any nausea, vomiting, constipation, or diarrhea.  He has no urinary complaints.  Patient offers no further specific complaints today.  REVIEW OF SYSTEMS:   Review of Systems  Constitutional: Negative.  Negative for fever, malaise/fatigue and weight loss.  HENT:  Positive for sore throat.   Respiratory: Negative.  Negative for cough, hemoptysis and shortness of breath.   Cardiovascular: Negative.  Negative for chest pain and leg swelling.  Gastrointestinal: Negative.  Negative for abdominal pain.  Genitourinary: Negative.  Negative for dysuria.  Musculoskeletal: Negative.  Negative for back pain.  Skin: Negative.  Negative for rash.  Neurological:  Positive for headaches. Negative for dizziness, focal weakness and weakness.  Psychiatric/Behavioral: Negative.  The patient is not nervous/anxious.     As per HPI. Otherwise, a complete review of systems is  negative.  PAST MEDICAL HISTORY: Past Medical History:  Diagnosis Date   Anxiety    Barrett esophagus    Bipolar disorder (HCC)    Cancer (HCC)    Chronic hepatitis C (HCC)    COPD (chronic obstructive pulmonary disease) (HCC)    Coronary artery disease    Depression    Diabetes mellitus without complication (HCC)    Dyslipidemia    GERD (gastroesophageal reflux disease)    Hepatitis    Hypertension    Leukocytosis    Pre-diabetes    Primary malignant neoplasm (HCC)    Stroke (HCC) 2014   mini-stroke per patient    PAST SURGICAL HISTORY: Past Surgical History:  Procedure Laterality Date   ANGIOPLASTY     APPENDECTOMY     BIOPSY  12/28/2023   Procedure: BIOPSY;  Surgeon: Onita Elspeth Sharper, DO;  Location: [facility] ENDOSCOPY;  Service: Gastroenterology;;   CARDIAC CATHETERIZATION     CHOLECYSTECTOMY     COLONOSCOPY WITH PROPOFOL  N/A 12/28/2023   Procedure: COLONOSCOPY WITH PROPOFOL ;  Surgeon: Onita Elspeth Sharper, DO;  Location: [facility] ENDOSCOPY;  Service: Gastroenterology;  Laterality: N/A;   CORONARY ANGIOPLASTY     ESOPHAGOGASTRODUODENOSCOPY N/A 12/28/2023   Procedure: ESOPHAGOGASTRODUODENOSCOPY (EGD);  Surgeon: Onita Elspeth Sharper, DO;  Location: [facility] ENDOSCOPY;  Service: Gastroenterology;  Laterality: N/A;   HEMOSTASIS CLIP PLACEMENT  12/28/2023   Procedure: HEMOSTASIS CLIP PLACEMENT;  Surgeon: Onita Elspeth Sharper, DO;  Location: [facility] ENDOSCOPY;  Service: Gastroenterology;;   POLYPECTOMY  12/28/2023   Procedure: POLYPECTOMY;  Surgeon: Onita Elspeth Sharper, DO;  Location: [facility] ENDOSCOPY;  Service: Gastroenterology;;   WRIST SURGERY Right    age 73    FAMILY HISTORY: Family  History  Problem Relation Age of Onset   Stroke Father    Leukemia Father    Breast cancer Sister     ADVANCED DIRECTIVES (Y/N):  N  HEALTH MAINTENANCE: Social History   Tobacco Use   Smoking status: Every Day    Current packs/day: 1.50    Types: Cigarettes   Smokeless tobacco: Never   Vaping Use   Vaping status: Never Used  Substance Use Topics   Alcohol use: Yes    Comment: occasional   Drug use: Yes    Types: Marijuana     Colonoscopy:  PAP:  Bone density:  Lipid panel:  Allergies  Allergen Reactions   Fluoxetine Swelling    Anxiety, itching   Hydrocodone -Acetaminophen  Itching   Mirtazapine  Other (See Comments)    nightmares   Hydrochlorothiazide  Other (See Comments)    LEG CRAMPING HYPOMAGNESEMIA   Tramadol Other (See Comments)   Gabapentin  Other (See Comments)    Nightmares     Current Outpatient Medications  Medication Sig Dispense Refill   albuterol  (VENTOLIN  HFA) 108 (90 Base) MCG/ACT inhaler Inhale 2 puffs into the lungs every 6 (six) hours as needed for wheezing or shortness of breath. 18 g 1   alum & mag hydroxide-simeth (MAALOX PLUS) 400-400-40 MG/5ML suspension Take 10 mLs by mouth every 6 (six) hours as needed for indigestion. 355 mL 0   amLODipine  (NORVASC ) 10 MG tablet Take 10 mg by mouth daily.     aspirin  81 MG chewable tablet Chew 1 tablet (81 mg total) by mouth daily. 30 tablet 1   atorvastatin  (LIPITOR) 40 MG tablet Take 1 tablet (40 mg total) by mouth daily. 30 tablet 1   busPIRone  (BUSPAR ) 10 MG tablet Take 2 tablets (20 mg total) by mouth 3 (three) times daily. 180 tablet 1   clonazePAM  (KLONOPIN ) 0.5 MG tablet Take 1 tablet (0.5 mg total) by mouth 2 (two) times daily as needed (Anxiety). 60 tablet 1   cyclobenzaprine  (FLEXERIL ) 10 MG tablet Take 1 tablet (10 mg total) by mouth 2 (two) times daily as needed for muscle spasms. 20 tablet 0   escitalopram  (LEXAPRO ) 20 MG tablet Take 1 tablet (20 mg total) by mouth daily. 30 tablet 1   famotidine  (PEPCID ) 20 MG tablet Take 20 mg by mouth 2 (two) times daily.     fluticasone-salmeterol (ADVAIR ) 500-50 MCG/ACT AEPB Inhale 1 puff into the lungs in the morning and at bedtime.     hydrOXYzine  (ATARAX /VISTARIL ) 25 MG tablet Take 1 tablet (25 mg total) by mouth 3 (three) times daily as  needed for anxiety. 90 tablet 1   ibuprofen  (ADVIL ) 600 MG tablet Take 600 mg by mouth 3 (three) times daily.     lisinopril  (ZESTRIL ) 20 MG tablet Take 1 tablet (20 mg total) by mouth daily. 30 tablet 1   magnesium  oxide (MAG-OX) 400 (240 Mg) MG tablet Take 400 mg by mouth 2 (two) times daily. TAKE 4 TABLETS BY MOUTH 2 TIMES DAILY     metFORMIN  (GLUCOPHAGE ) 1000 MG tablet Take 1 tablet (1,000 mg total) by mouth 2 (two) times daily with a meal. 60 tablet 1   mometasone -formoterol  (DULERA ) 200-5 MCG/ACT AERO Inhale 2 puffs into the lungs 2 (two) times daily. 1 each 1   ondansetron  (ZOFRAN ) 8 MG tablet Take 1 tablet (8 mg total) by mouth every 8 (eight) hours as needed for nausea or vomiting. Start on the third day after chemotherapy. 60 tablet 1   pantoprazole  (PROTONIX ) 40 MG tablet  Take 1 tablet (40 mg total) by mouth daily. 30 tablet 1   pramipexole (MIRAPEX) 0.5 MG tablet Take 0.5 mg by mouth at bedtime.     predniSONE  (DELTASONE ) 10 MG tablet Take 1 tablet (10 mg total) by mouth daily. Day 1-2: Take 50 mg  ( 5 pills) Day 3-4 : Take 40 mg (4pills) Day 5-6: Take 30 mg (3 pills) Day 7-8:  Take 20 mg (2 pills) Day 9:  Take 10mg  (1 pill) 29 tablet 0   prochlorperazine  (COMPAZINE ) 10 MG tablet Take 1 tablet (10 mg total) by mouth every 6 (six) hours as needed for nausea or vomiting. 60 tablet 1   QUEtiapine  (SEROQUEL  XR) 400 MG 24 hr tablet Take 1 tablet (400 mg total) by mouth at bedtime. 30 tablet 1   rOPINIRole (REQUIP XL) 2 MG 24 hr tablet Take by mouth.     sucralfate  (CARAFATE ) 1 g tablet Take 1 tablet (1 g total) by mouth 3 (three) times daily before meals. Dissolve tablet in 4 table spoons of warm water swish and swallow 30 minutes before meals. 90 tablet 0   tiotropium (SPIRIVA) 18 MCG inhalation capsule Place 18 mcg into inhaler and inhale daily.     zolpidem  (AMBIEN ) 10 MG tablet Take 1 tablet (10 mg total) by mouth at bedtime as needed for sleep. 30 tablet 1   No current  facility-administered medications for this visit.   Facility-Administered Medications Ordered in Other Visits  Medication Dose Route Frequency Provider Last Rate Last Admin   0.9 %  sodium chloride  infusion   Intravenous Continuous Dalinda Heidt J, MD 10 mL/hr at 08/29/24 1033 New Bag at 08/29/24 1033   CARBOplatin  (PARAPLATIN ) 300 mg in sodium chloride  0.9 % 100 mL chemo infusion  300 mg Intravenous Once Jacobo Evalene PARAS, MD        OBJECTIVE: Vitals:   08/29/24 0937  BP: 118/78  Pulse: 76  Resp: 18  Temp: 97.6 F (36.4 C)  SpO2: 96%     Body mass index is 36.62 kg/m.    ECOG FS:0 - Asymptomatic  General: Well-developed, well-nourished, no acute distress. Eyes: Pink conjunctiva, anicteric sclera. HEENT: Normocephalic, moist mucous membranes. Lungs: No audible wheezing or coughing. Heart: Regular rate and rhythm. Abdomen: Soft, nontender, no obvious distention. Musculoskeletal: No edema, cyanosis, or clubbing. Neuro: Alert, answering all questions appropriately. Cranial nerves grossly intact. Skin: No rashes or petechiae noted. Psych: Normal affect.  LAB RESULTS:  Lab Results  Component Value Date   NA 135 08/29/2024   K 4.5 08/29/2024   CL 102 08/29/2024   CO2 25 08/29/2024   GLUCOSE 94 08/29/2024   BUN 14 08/29/2024   CREATININE 0.93 08/29/2024   CALCIUM  8.6 (L) 08/29/2024   PROT 6.4 (L) 08/29/2024   ALBUMIN 3.8 08/29/2024   AST 13 (L) 08/29/2024   ALT 12 08/29/2024   ALKPHOS 61 08/29/2024   BILITOT 0.5 08/29/2024   GFRNONAA >60 08/29/2024   GFRAA >60 09/05/2020    Lab Results  Component Value Date   WBC 5.5 08/29/2024   NEUTROABS 4.0 08/29/2024   HGB 13.5 08/29/2024   HCT 41.8 08/29/2024   MCV 84.8 08/29/2024   PLT 169 08/29/2024     STUDIES: No results found.   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  PLAN:    Poorly differentiated adenocarcinoma of the lower third esophagus, HER2  negative: Confirmed by EUS biopsy at [facility] on June 21, 2024.  Mismatch repair  proteins (MMR) by IHC: Preserved nuclear expression of all MLH1, PMS2, MSH2 and MSH6 proteins.  Unclear if this is recurrent disease or a second primary.  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  Despite recommendation, patient declined surgery or any other treatments at that time.  He had been monitored at [facility] with frequent endoscopies without evidence of recurrent or progression of disease.  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  There is also central calcification reported.  This was not present at his most recent imaging 7 years ago in 2018.  PET scan results from May 27, 2024 confirmed hypermetabolism of lesion, but no other evidence of disease.  Biopsy confirmed malignancy.  After a lengthy discussion with the patient and his daughters, he wishes to proceed with treatment this time around and the recommended weekly carboplatin  and Taxol  along with concurrent XRT followed by possible surgical resection and a referral has been sent to [facility].  Proceed with cycle 3 of weekly carboplatin  and Taxol  today.  Continue daily XRT.  Return to clinic in 1 week for further evaluation and consideration of cycle 4. Hyponatremia: Resolved.   Headaches: Patient has been instructed to increase his fluid intake and take Tylenol  intermittently with ibuprofen . Dysphagia: Patient has been instructed to take Carafate  as prescribed and will consider Magic mouthwash in the future.  I spent a total of 30 minutes reviewing chart data, face-to-face evaluation with the patient, counseling and coordination of care as detailed above.   Patient expressed understanding and was in agreement with this plan. He also understands that He can call clinic at any  time with any questions, concerns, or complaints.    Cancer Staging  Esophageal cancer [facility]) Staging form: Esophagus - Adenocarcinoma, AJCC 8th Edition - Clinical stage from 06/27/2024: Stage Unknown (cTX, cN0, cM0, G3) - Signed by Jacobo Evalene PARAS, MD on 06/27/2024 Stage prefix: Initial diagnosis Total positive nodes: 0 Histologic grading system: 3 grade system   Evalene PARAS Jacobo, MD   08/29/2024 12:28 PM

## 2024-08-29 NOTE — Progress Notes (Signed)
 Patient is having severe headaches that is constant. He is also having the burning. He is also very fatigued.

## 2024-08-30 ENCOUNTER — Other Ambulatory Visit: Payer: Self-pay

## 2024-08-30 ENCOUNTER — Ambulatory Visit
Admission: RE | Admit: 2024-08-30 | Discharge: 2024-08-30 | Disposition: A | Discharge status: Home / Self Care | Payer: MEDICAID | Source: Ambulatory Visit | Attending: Radiation Oncology | Admitting: Radiation Oncology

## 2024-08-30 DIAGNOSIS — Z510 Encounter for antineoplastic radiation therapy: Secondary | ICD-10-CM | POA: Diagnosis not present

## 2024-08-30 LAB — RAD ONC ARIA SESSION SUMMARY
Course Elapsed Days: 19
Plan Fractions Treated to Date: 13
Plan Prescribed Dose Per Fraction: 1.8 Gy
Plan Total Fractions Prescribed: 30
Plan Total Prescribed Dose: 54 Gy
Reference Point Dosage Given to Date: 23.4 Gy
Reference Point Session Dosage Given: 1.8 Gy
Session Number: 12

## 2024-08-31 ENCOUNTER — Other Ambulatory Visit: Payer: Self-pay

## 2024-08-31 ENCOUNTER — Ambulatory Visit
Admission: RE | Admit: 2024-08-31 | Discharge: 2024-08-31 | Disposition: A | Discharge status: Home / Self Care | Payer: MEDICAID | Source: Ambulatory Visit | Attending: Radiation Oncology | Admitting: Radiation Oncology

## 2024-08-31 ENCOUNTER — Inpatient Hospital Stay: Payer: MEDICAID

## 2024-08-31 DIAGNOSIS — Z510 Encounter for antineoplastic radiation therapy: Secondary | ICD-10-CM | POA: Diagnosis not present

## 2024-08-31 LAB — RAD ONC ARIA SESSION SUMMARY
Course Elapsed Days: 20
Plan Fractions Treated to Date: 14
Plan Prescribed Dose Per Fraction: 1.8 Gy
Plan Total Fractions Prescribed: 30
Plan Total Prescribed Dose: 54 Gy
Reference Point Dosage Given to Date: 25.2 Gy
Reference Point Session Dosage Given: 1.8 Gy
Session Number: 13

## 2024-09-01 ENCOUNTER — Ambulatory Visit
Admission: RE | Admit: 2024-09-01 | Discharge: 2024-09-01 | Disposition: A | Discharge status: Home / Self Care | Payer: MEDICAID | Source: Ambulatory Visit | Attending: Radiation Oncology | Admitting: Radiation Oncology

## 2024-09-01 ENCOUNTER — Other Ambulatory Visit: Payer: Self-pay

## 2024-09-01 DIAGNOSIS — Z510 Encounter for antineoplastic radiation therapy: Secondary | ICD-10-CM | POA: Diagnosis not present

## 2024-09-01 LAB — RAD ONC ARIA SESSION SUMMARY
Course Elapsed Days: 21
Plan Fractions Treated to Date: 15
Plan Prescribed Dose Per Fraction: 1.8 Gy
Plan Total Fractions Prescribed: 30
Plan Total Prescribed Dose: 54 Gy
Reference Point Dosage Given to Date: 27 Gy
Reference Point Session Dosage Given: 1.8 Gy
Session Number: 14

## 2024-09-02 ENCOUNTER — Other Ambulatory Visit: Payer: Self-pay

## 2024-09-02 ENCOUNTER — Ambulatory Visit
Admission: RE | Admit: 2024-09-02 | Discharge: 2024-09-02 | Disposition: A | Discharge status: Home / Self Care | Payer: MEDICAID | Source: Ambulatory Visit | Attending: Radiation Oncology | Admitting: Radiation Oncology

## 2024-09-02 DIAGNOSIS — Z510 Encounter for antineoplastic radiation therapy: Secondary | ICD-10-CM | POA: Diagnosis not present

## 2024-09-02 LAB — RAD ONC ARIA SESSION SUMMARY
Course Elapsed Days: 22
Plan Fractions Treated to Date: 16
Plan Prescribed Dose Per Fraction: 1.8 Gy
Plan Total Fractions Prescribed: 30
Plan Total Prescribed Dose: 54 Gy
Reference Point Dosage Given to Date: 28.8 Gy
Reference Point Session Dosage Given: 1.8 Gy
Session Number: 15

## 2024-09-05 ENCOUNTER — Inpatient Hospital Stay: Payer: MEDICAID

## 2024-09-05 ENCOUNTER — Ambulatory Visit
Admission: RE | Admit: 2024-09-05 | Discharge: 2024-09-05 | Disposition: A | Discharge status: Home / Self Care | Payer: MEDICAID | Source: Ambulatory Visit | Attending: Radiation Oncology | Admitting: Radiation Oncology

## 2024-09-05 ENCOUNTER — Inpatient Hospital Stay (HOSPITAL_BASED_OUTPATIENT_CLINIC_OR_DEPARTMENT_OTHER): Payer: MEDICAID | Admitting: Oncology

## 2024-09-05 ENCOUNTER — Other Ambulatory Visit: Payer: Self-pay

## 2024-09-05 ENCOUNTER — Encounter: Payer: Self-pay | Admitting: Oncology

## 2024-09-05 VITALS — BP 126/81

## 2024-09-05 VITALS — BP 118/83 | HR 81 | Temp 98.6°F | Resp 20 | Wt 269.5 lb

## 2024-09-05 DIAGNOSIS — C155 Malignant neoplasm of lower third of esophagus: Secondary | ICD-10-CM

## 2024-09-05 DIAGNOSIS — Z510 Encounter for antineoplastic radiation therapy: Secondary | ICD-10-CM | POA: Diagnosis not present

## 2024-09-05 LAB — CMP (CANCER CENTER ONLY)
ALT: 16 U/L (ref 0–44)
AST: 19 U/L (ref 15–41)
Albumin: 3.8 g/dL (ref 3.5–5.0)
Alkaline Phosphatase: 61 U/L (ref 38–126)
Anion gap: 9 (ref 5–15)
BUN: 9 mg/dL (ref 8–23)
CO2: 23 mmol/L (ref 22–32)
Calcium: 8.5 mg/dL — ABNORMAL LOW (ref 8.9–10.3)
Chloride: 103 mmol/L (ref 98–111)
Creatinine: 0.84 mg/dL (ref 0.61–1.24)
GFR, Estimated: >60 mL/min (ref >60)
Glucose, Bld: 105 mg/dL — ABNORMAL HIGH (ref 70–99)
Potassium: 4.1 mmol/L (ref 3.5–5.1)
Sodium: 135 mmol/L (ref 135–145)
Total Bilirubin: 0.3 mg/dL (ref 0.0–1.2)
Total Protein: 6.3 g/dL — ABNORMAL LOW (ref 6.5–8.1)

## 2024-09-05 LAB — RAD ONC ARIA SESSION SUMMARY
Course Elapsed Days: 25
Plan Fractions Treated to Date: 17
Plan Prescribed Dose Per Fraction: 1.8 Gy
Plan Total Fractions Prescribed: 30
Plan Total Prescribed Dose: 54 Gy
Reference Point Dosage Given to Date: 30.6 Gy
Reference Point Session Dosage Given: 1.8 Gy
Session Number: 16

## 2024-09-05 LAB — CBC WITH DIFFERENTIAL (CANCER CENTER ONLY)
Abs Immature Granulocytes: 0.05 K/uL (ref 0.00–0.07)
Basophils Absolute: 0.1 K/uL (ref 0.0–0.1)
Basophils Relative: 1 %
Eosinophils Absolute: 0.2 K/uL (ref 0.0–0.5)
Eosinophils Relative: 3 %
HCT: 42.1 % (ref 39.0–52.0)
Hemoglobin: 13.5 g/dL (ref 13.0–17.0)
Immature Granulocytes: 1 %
Lymphocytes Relative: 14 %
Lymphs Abs: 0.8 K/uL (ref 0.7–4.0)
MCH: 27.3 pg (ref 26.0–34.0)
MCHC: 32.1 g/dL (ref 30.0–36.0)
MCV: 85.1 fL (ref 80.0–100.0)
Monocytes Absolute: 0.3 K/uL (ref 0.1–1.0)
Monocytes Relative: 5 %
Neutro Abs: 4.4 K/uL (ref 1.7–7.7)
Neutrophils Relative %: 76 %
Platelet Count: 190 K/uL (ref 150–400)
RBC: 4.95 MIL/uL (ref 4.22–5.81)
RDW: 13.5 % (ref 11.5–15.5)
WBC Count: 5.8 K/uL (ref 4.0–10.5)
nRBC: 0 % (ref 0.0–0.2)

## 2024-09-05 MED ORDER — SODIUM CHLORIDE 0.9 % IV SOLN
300.0000 mg | Freq: Once | INTRAVENOUS | Status: AC
  Administered 2024-09-05: 300 mg via INTRAVENOUS
  Filled 2024-09-05: qty 30

## 2024-09-05 MED ORDER — DEXAMETHASONE SODIUM PHOSPHATE 10 MG/ML IJ SOLN
10.0000 mg | Freq: Once | INTRAMUSCULAR | Status: AC
  Administered 2024-09-05: 10 mg via INTRAVENOUS
  Filled 2024-09-05: qty 1

## 2024-09-05 MED ORDER — MAGIC MOUTHWASH W/LIDOCAINE: 5.0000 mL | Freq: Four times a day (QID) | ORAL | 1 refills | Status: DC | PRN

## 2024-09-05 MED ORDER — DIPHENHYDRAMINE HCL 50 MG/ML IJ SOLN
25.0000 mg | Freq: Once | INTRAMUSCULAR | Status: AC
  Administered 2024-09-05: 25 mg via INTRAVENOUS
  Filled 2024-09-05: qty 1

## 2024-09-05 MED ORDER — STERILE WATER FOR INJECTION IJ SOLN
5.0000 mL | Freq: Four times a day (QID) | OROMUCOSAL | 3 refills | Status: AC | PRN
  Filled 2024-09-05: qty 480, 12d supply, fill #0

## 2024-09-05 MED ORDER — SODIUM CHLORIDE 0.9 % IV SOLN
INTRAVENOUS | Status: DC
  Filled 2024-09-05: qty 250

## 2024-09-05 MED ORDER — PALONOSETRON HCL INJECTION 0.25 MG/5ML
0.2500 mg | Freq: Once | INTRAVENOUS | Status: AC
  Administered 2024-09-05: 0.25 mg via INTRAVENOUS
  Filled 2024-09-05: qty 5

## 2024-09-05 MED ORDER — SODIUM CHLORIDE 0.9 % IV SOLN
50.0000 mg/m2 | Freq: Once | INTRAVENOUS | Status: AC
  Administered 2024-09-05: 126 mg via INTRAVENOUS
  Filled 2024-09-05: qty 21

## 2024-09-05 MED ORDER — MAGIC MOUTHWASH W/LIDOCAINE
5.0000 mL | Freq: Four times a day (QID) | ORAL | 1 refills | Status: DC | PRN
Start: 2024-09-05 — End: 2024-09-05

## 2024-09-05 MED ORDER — FAMOTIDINE IN NACL 20-0.9 MG/50ML-% IV SOLN
20.0000 mg | Freq: Once | INTRAVENOUS | Status: AC
  Administered 2024-09-05: 20 mg via INTRAVENOUS
  Filled 2024-09-05: qty 50

## 2024-09-05 NOTE — Progress Notes (Signed)
 Patient states he is having a burning pain in the bottom of esophagus area.

## 2024-09-05 NOTE — Patient Instructions (Signed)
 [facility] - A DEPT OF [facility]  Discharge Instructions: Thank you for choosing [facility] to provide your oncology and hematology care.  If you have a lab appointment with the Cancer Center, please go directly to the Cancer Center and check in at the registration area.  Wear comfortable clothing and clothing appropriate for easy access to any Portacath or PICC line.   We strive to give you quality time with your provider. You may need to reschedule your appointment if you arrive late (15 or more minutes).  Arriving late affects you and other patients whose appointments are after yours.  Also, if you miss three or more appointments without notifying the office, you may be dismissed from the clinic at the provider's discretion.      For prescription refill requests, have your pharmacy contact our office and allow 72 hours for refills to be completed.    Today you received the following chemotherapy and/or immunotherapy agents TAXOL and CARBOPLATIN       To help prevent nausea and vomiting after your treatment, we encourage you to take your nausea medication as directed.  BELOW ARE SYMPTOMS THAT SHOULD BE REPORTED IMMEDIATELY: *FEVER GREATER THAN 100.4 F (38 C) OR HIGHER *CHILLS OR SWEATING *NAUSEA AND VOMITING THAT IS NOT CONTROLLED WITH YOUR NAUSEA MEDICATION *UNUSUAL SHORTNESS OF BREATH *UNUSUAL BRUISING OR BLEEDING *URINARY PROBLEMS (pain or burning when urinating, or frequent urination) *BOWEL PROBLEMS (unusual diarrhea, constipation, pain near the anus) TENDERNESS IN MOUTH AND THROAT WITH OR WITHOUT PRESENCE OF ULCERS (sore throat, sores in mouth, or a toothache) UNUSUAL RASH, SWELLING OR PAIN  UNUSUAL VAGINAL DISCHARGE OR ITCHING   Items with * indicate a potential emergency and should be followed up as soon as possible or go to the Emergency Department if any problems should occur.  Please show the CHEMOTHERAPY ALERT CARD or  IMMUNOTHERAPY ALERT CARD at check-in to the Emergency Department and triage nurse.  Should you have questions after your visit or need to cancel or reschedule your appointment, please contact [facility] - A DEPT OF [facility]  [phone] and follow the prompts.  Office hours are 8:00 a.m. to 4:30 p.m. Monday - Friday. Please note that voicemails left after 4:00 p.m. may not be returned until the following business day.  We are closed weekends and major holidays. You have access to a nurse at all times for urgent questions. Please call the main number to the clinic [phone] and follow the prompts.  For any non-urgent questions, you may also contact your provider using MyChart. We now offer e-Visits for anyone 83 and older to request care online for non-urgent symptoms. For details visit [email or web address].   Also download the MyChart app! Go to the app store, search "MyChart", open the app, select [facility], and log in with your MyChart username and password.  Paclitaxel Injection What is this medication? PACLITAXEL (PAK li TAX el) treats some types of cancer. It works by slowing down the growth of cancer cells. This medicine may be used for other purposes; ask your health care provider or pharmacist if you have questions. COMMON BRAND NAME(S): Onxol, Taxol What should I tell my care team before I take this medication? They need to know if you have any of these conditions: Heart disease Liver disease Low white blood cell levels An unusual or allergic reaction to paclitaxel, other medications, foods, dyes, or preservatives  If you or your partner are pregnant or trying to get pregnant Breast-feeding How should I use this medication? This medication is injected into a vein. It is given by your care team in a hospital or clinic setting. Talk to your care team about the use of this medication in children. While it may be given to children for selected  conditions, precautions do apply. Overdosage: If you think you have taken too much of this medicine contact a poison control center or emergency room at once. NOTE: This medicine is only for you. Do not share this medicine with others. What if I miss a dose? Keep appointments for follow-up doses. It is important not to miss your dose. Call your care team if you are unable to keep an appointment. What may interact with this medication? Do not take this medication with any of the following: Live virus vaccines Other medications may affect the way this medication works. Talk with your care team about all of the medications you take. They may suggest changes to your treatment plan to lower the risk of side effects and to make sure your medications work as intended. This list may not describe all possible interactions. Give your health care provider a list of all the medicines, herbs, non-prescription drugs, or dietary supplements you use. Also tell them if you smoke, drink alcohol, or use illegal drugs. Some items may interact with your medicine. What should I watch for while using this medication? Your condition will be monitored carefully while you are receiving this medication. You may need blood work while taking this medication. This medication may make you feel generally unwell. This is not uncommon as chemotherapy can affect healthy cells as well as cancer cells. Report any side effects. Continue your course of treatment even though you feel ill unless your care team tells you to stop. This medication can cause serious allergic reactions. To reduce the risk, your care team may give you other medications to take before receiving this one. Be sure to follow the directions from your care team. This medication may increase your risk of getting an infection. Call your care team for advice if you get a fever, chills, sore throat, or other symptoms of a cold or flu. Do not treat yourself. Try to avoid  being around people who are sick. This medication may increase your risk to bruise or bleed. Call your care team if you notice any unusual bleeding. Be careful brushing or flossing your teeth or using a toothpick because you may get an infection or bleed more easily. If you have any dental work done, tell your dentist you are receiving this medication. Talk to your care team if you may be pregnant. Serious birth defects can occur if you take this medication during pregnancy. Talk to your care team before breastfeeding. Changes to your treatment plan may be needed. What side effects may I notice from receiving this medication? Side effects that you should report to your care team as soon as possible: Allergic reactions--skin rash, itching, hives, swelling of the face, lips, tongue, or throat Heart rhythm changes--fast or irregular heartbeat, dizziness, feeling faint or lightheaded, chest pain, trouble breathing Increase in blood pressure Infection--fever, chills, cough, sore throat, wounds that don't heal, pain or trouble when passing urine, general feeling of discomfort or being unwell Low blood pressure--dizziness, feeling faint or lightheaded, blurry vision Low red blood cell level--unusual weakness or fatigue, dizziness, headache, trouble breathing Painful swelling, warmth, or redness of the skin, blisters  or sores at the infusion site Pain, tingling, or numbness in the hands or feet Slow heartbeat--dizziness, feeling faint or lightheaded, confusion, trouble breathing, unusual weakness or fatigue Unusual bruising or bleeding Side effects that usually do not require medical attention (report to your care team if they continue or are bothersome): Diarrhea Hair loss Joint pain Loss of appetite Muscle pain Nausea Vomiting This list may not describe all possible side effects. Call your doctor for medical advice about side effects. You may report side effects to FDA at 1-800-FDA-1088. Where  should I keep my medication? This medication is given in a hospital or clinic. It will not be stored at home. NOTE: This sheet is a summary. It may not cover all possible information. If you have questions about this medicine, talk to your doctor, pharmacist, or health care provider.  2024 Elsevier/Gold Standard (2022-04-29 00:00:00)  Carboplatin Injection What is this medication? CARBOPLATIN (KAR boe pla tin) treats some types of cancer. It works by slowing down the growth of cancer cells. This medicine may be used for other purposes; ask your health care provider or pharmacist if you have questions. COMMON BRAND NAME(S): Paraplatin What should I tell my care team before I take this medication? They need to know if you have any of these conditions: Blood disorders Hearing problems Kidney disease Recent or ongoing radiation therapy An unusual or allergic reaction to carboplatin, cisplatin, other medications, foods, dyes, or preservatives Pregnant or trying to get pregnant Breast-feeding How should I use this medication? This medication is injected into a vein. It is given by your care team in a hospital or clinic setting. Talk to your care team about the use of this medication in children. Special care may be needed. Overdosage: If you think you have taken too much of this medicine contact a poison control center or emergency room at once. NOTE: This medicine is only for you. Do not share this medicine with others. What if I miss a dose? Keep appointments for follow-up doses. It is important not to miss your dose. Call your care team if you are unable to keep an appointment. What may interact with this medication? Medications for seizures Some antibiotics, such as amikacin, gentamicin, neomycin, streptomycin, tobramycin Vaccines This list may not describe all possible interactions. Give your health care provider a list of all the medicines, herbs, non-prescription drugs, or dietary  supplements you use. Also tell them if you smoke, drink alcohol, or use illegal drugs. Some items may interact with your medicine. What should I watch for while using this medication? Your condition will be monitored carefully while you are receiving this medication. You may need blood work while taking this medication. This medication may make you feel generally unwell. This is not uncommon, as chemotherapy can affect healthy cells as well as cancer cells. Report any side effects. Continue your course of treatment even though you feel ill unless your care team tells you to stop. In some cases, you may be given additional medications to help with side effects. Follow all directions for their use. This medication may increase your risk of getting an infection. Call your care team for advice if you get a fever, chills, sore throat, or other symptoms of a cold or flu. Do not treat yourself. Try to avoid being around people who are sick. Avoid taking medications that contain aspirin, acetaminophen, ibuprofen, naproxen, or ketoprofen unless instructed by your care team. These medications may hide a fever. Be careful brushing or flossing your  teeth or using a toothpick because you may get an infection or bleed more easily. If you have any dental work done, tell your dentist you are receiving this medication. Talk to your care team if you wish to become pregnant or think you might be pregnant. This medication can cause serious birth defects. Talk to your care team about effective forms of contraception. Do not breast-feed while taking this medication. What side effects may I notice from receiving this medication? Side effects that you should report to your care team as soon as possible: Allergic reactions--skin rash, itching, hives, swelling of the face, lips, tongue, or throat Infection--fever, chills, cough, sore throat, wounds that don't heal, pain or trouble when passing urine, general feeling of  discomfort or being unwell Low red blood cell level--unusual weakness or fatigue, dizziness, headache, trouble breathing Pain, tingling, or numbness in the hands or feet, muscle weakness, change in vision, confusion or trouble speaking, loss of balance or coordination, trouble walking, seizures Unusual bruising or bleeding Side effects that usually do not require medical attention (report to your care team if they continue or are bothersome): Hair loss Nausea Unusual weakness or fatigue Vomiting This list may not describe all possible side effects. Call your doctor for medical advice about side effects. You may report side effects to FDA at 1-800-FDA-1088. Where should I keep my medication? This medication is given in a hospital or clinic. It will not be stored at home. NOTE: This sheet is a summary. It may not cover all possible information. If you have questions about this medicine, talk to your doctor, pharmacist, or health care provider.  2024 Elsevier/Gold Standard (2022-04-01 00:00:00)

## 2024-09-05 NOTE — Progress Notes (Signed)
 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  INTERVAL HISTORY: Patient returns to clinic today for further evaluation and consideration of cycle 4 of weekly carboplatin  and Taxol  along with his daily XRT.  He does not complain of headache today.  He continues to have persistent dysphagia and difficulty swallowing.  He otherwise feels well.  He does not complain of pain today.  He has no neurologic complaints. He denies any recent fevers or illnesses.  He has a good appetite and denies weight loss.  He has no chest pain, shortness of breath, cough, or hemoptysis.  He denies any nausea, vomiting, constipation, or diarrhea.  He has no urinary complaints.  Patient offers no further specific complaints today.  REVIEW OF SYSTEMS:   Review of Systems  Constitutional: Negative.  Negative for fever, malaise/fatigue and weight loss.  HENT:  Positive for sore throat.   Respiratory: Negative.  Negative for cough, hemoptysis and shortness of breath.   Cardiovascular: Negative.  Negative for chest pain and leg swelling.  Gastrointestinal: Negative.  Negative for abdominal pain.  Genitourinary: Negative.  Negative for dysuria.  Musculoskeletal: Negative.  Negative for back pain.  Skin: Negative.  Negative for rash.  Neurological: Negative.  Negative for dizziness, focal weakness, weakness and headaches.  Psychiatric/Behavioral: Negative.  The patient is not nervous/anxious.     As per HPI. Otherwise, a complete review of systems is negative.  PAST MEDICAL HISTORY: Past Medical History:  Diagnosis Date    Anxiety    Barrett esophagus    Bipolar disorder (HCC)    Cancer (HCC)    Chronic hepatitis C (HCC)    COPD (chronic obstructive pulmonary disease) (HCC)    Coronary artery disease    Depression    Diabetes mellitus without complication (HCC)    Dyslipidemia    GERD (gastroesophageal reflux disease)    Hepatitis    Hypertension    Leukocytosis    Pre-diabetes    Primary malignant neoplasm (HCC)    Stroke (HCC) 2014   mini-stroke per patient    PAST SURGICAL HISTORY: Past Surgical History:  Procedure Laterality Date   ANGIOPLASTY     APPENDECTOMY     BIOPSY  12/28/2023   Procedure: BIOPSY;  Surgeon: Ian Elspeth Sharper, DO;  Location: [facility] ENDOSCOPY;  Service: Gastroenterology;;   CARDIAC CATHETERIZATION     CHOLECYSTECTOMY     COLONOSCOPY WITH PROPOFOL  N/A 12/28/2023   Procedure: COLONOSCOPY WITH PROPOFOL ;  Surgeon: Ian Elspeth Sharper, DO;  Location: [facility] ENDOSCOPY;  Service: Gastroenterology;  Laterality: N/A;   CORONARY ANGIOPLASTY     ESOPHAGOGASTRODUODENOSCOPY N/A 12/28/2023   Procedure: ESOPHAGOGASTRODUODENOSCOPY (EGD);  Surgeon: Ian Elspeth Sharper, DO;  Location: [facility] ENDOSCOPY;  Service: Gastroenterology;  Laterality: N/A;   HEMOSTASIS CLIP PLACEMENT  12/28/2023   Procedure: HEMOSTASIS CLIP PLACEMENT;  Surgeon: Ian Elspeth Sharper, DO;  Location: [name] ENDOSCOPY;  Service: Gastroenterology;;   POLYPECTOMY  12/28/2023   Procedure: POLYPECTOMY;  Surgeon: Ian Elspeth Sharper, DO;  Location: [facility] ENDOSCOPY;  Service: Gastroenterology;;   WRIST SURGERY Right    age 63    FAMILY HISTORY: Family History  Problem Relation Age of Onset   Stroke Father  Leukemia Father    Breast cancer Sister     ADVANCED DIRECTIVES (Y/N):  N  HEALTH MAINTENANCE: Social History   Tobacco Use   Smoking status: Every Day    Current packs/day: 1.50    Types: Cigarettes   Smokeless tobacco: Never  Vaping Use   Vaping status: Never Used  Substance Use Topics   Alcohol  use: Yes    Comment: occasional   Drug use: Yes    Types: Marijuana     Colonoscopy:  PAP:  Bone density:  Lipid panel:  Allergies  Allergen Reactions   Fluoxetine Swelling    Anxiety, itching   Hydrocodone -Acetaminophen  Itching   Mirtazapine  Other (See Comments)    nightmares   Hydrochlorothiazide  Other (See Comments)    LEG CRAMPING HYPOMAGNESEMIA   Tramadol Other (See Comments)   Gabapentin  Other (See Comments)    Nightmares     Current Outpatient Medications  Medication Sig Dispense Refill   albuterol  (VENTOLIN  HFA) 108 (90 Base) MCG/ACT inhaler Inhale 2 puffs into the lungs every 6 (six) hours as needed for wheezing or shortness of breath. 18 g 1   alum & mag hydroxide-simeth (MAALOX PLUS) 400-400-40 MG/5ML suspension Take 10 mLs by mouth every 6 (six) hours as needed for indigestion. 355 mL 0   amLODipine  (NORVASC ) 10 MG tablet Take 10 mg by mouth daily.     aspirin  81 MG chewable tablet Chew 1 tablet (81 mg total) by mouth daily. 30 tablet 1   atorvastatin  (LIPITOR) 40 MG tablet Take 1 tablet (40 mg total) by mouth daily. 30 tablet 1   busPIRone  (BUSPAR ) 10 MG tablet Take 2 tablets (20 mg total) by mouth 3 (three) times daily. 180 tablet 1   clonazePAM  (KLONOPIN ) 0.5 MG tablet Take 1 tablet (0.5 mg total) by mouth 2 (two) times daily as needed (Anxiety). 60 tablet 1   cyclobenzaprine  (FLEXERIL ) 10 MG tablet Take 1 tablet (10 mg total) by mouth 2 (two) times daily as needed for muscle spasms. 20 tablet 0   escitalopram  (LEXAPRO ) 20 MG tablet Take 1 tablet (20 mg total) by mouth daily. 30 tablet 1   famotidine  (PEPCID ) 20 MG tablet Take 20 mg by mouth 2 (two) times daily.     fluticasone-salmeterol (ADVAIR ) 500-50 MCG/ACT AEPB Inhale 1 puff into the lungs in the morning and at bedtime.     hydrOXYzine  (ATARAX /VISTARIL ) 25 MG tablet Take 1 tablet (25 mg total) by mouth 3 (three) times daily as needed for anxiety. 90 tablet 1   ibuprofen  (ADVIL ) 600 MG tablet Take 600 mg  by mouth 3 (three) times daily.     lisinopril  (ZESTRIL ) 20 MG tablet Take 1 tablet (20 mg total) by mouth daily. 30 tablet 1   magnesium  oxide (MAG-OX) 400 (240 Mg) MG tablet Take 400 mg by mouth 2 (two) times daily. TAKE 4 TABLETS BY MOUTH 2 TIMES DAILY     metFORMIN  (GLUCOPHAGE ) 1000 MG tablet Take 1 tablet (1,000 mg total) by mouth 2 (two) times daily with a meal. 60 tablet 1   mometasone -formoterol  (DULERA ) 200-5 MCG/ACT AERO Inhale 2 puffs into the lungs 2 (two) times daily. 1 each 1   ondansetron  (ZOFRAN ) 8 MG tablet Take 1 tablet (8 mg total) by mouth every 8 (eight) hours as needed for nausea or vomiting. Start on the third day after chemotherapy. 60 tablet 1   pantoprazole  (PROTONIX ) 40 MG tablet Take 1 tablet (40 mg total) by mouth daily. 30 tablet 1  pramipexole (MIRAPEX) 0.5 MG tablet Take 0.5 mg by mouth at bedtime.     predniSONE  (DELTASONE ) 10 MG tablet Take 1 tablet (10 mg total) by mouth daily. Day 1-2: Take 50 mg  ( 5 pills) Day 3-4 : Take 40 mg (4pills) Day 5-6: Take 30 mg (3 pills) Day 7-8:  Take 20 mg (2 pills) Day 9:  Take 10mg  (1 pill) 29 tablet 0   prochlorperazine  (COMPAZINE ) 10 MG tablet Take 1 tablet (10 mg total) by mouth every 6 (six) hours as needed for nausea or vomiting. 60 tablet 1   QUEtiapine  (SEROQUEL  XR) 400 MG 24 hr tablet Take 1 tablet (400 mg total) by mouth at bedtime. 30 tablet 1   rOPINIRole (REQUIP XL) 2 MG 24 hr tablet Take by mouth.     sucralfate  (CARAFATE ) 1 g tablet Take 1 tablet (1 g total) by mouth 3 (three) times daily before meals. Dissolve tablet in 4 table spoons of warm water  swish and swallow 30 minutes before meals. 90 tablet 0   tiotropium (SPIRIVA) 18 MCG inhalation capsule Place 18 mcg into inhaler and inhale daily.     zolpidem  (AMBIEN ) 10 MG tablet Take 1 tablet (10 mg total) by mouth at bedtime as needed for sleep. 30 tablet 1   No current facility-administered medications for this visit.   Facility-Administered Medications Ordered  in Other Visits  Medication Dose Route Frequency Provider Last Rate Last Admin   0.9 %  sodium chloride  infusion   Intravenous Continuous Ian, Sallie Maker J, MD       CARBOplatin  (PARAPLATIN ) 300 mg in sodium chloride  0.9 % 100 mL chemo infusion  300 mg Intravenous Once Jonatha Gagen J, MD       dexamethasone  (DECADRON ) injection 10 mg  10 mg Intravenous Once Joanny Dupree J, MD       diphenhydrAMINE  (BENADRYL ) injection 25 mg  25 mg Intravenous Once Durant Scibilia J, MD       famotidine  (PEPCID ) IVPB 20 mg premix  20 mg Intravenous Once Vayla Wilhelmi J, MD       PACLitaxel  (TAXOL ) 126 mg in sodium chloride  0.9 % 250 mL chemo infusion (</= 80mg /m2)  50 mg/m2 (Treatment Plan Recorded) Intravenous Once Natania Finigan J, MD       palonosetron  (ALOXI ) injection 0.25 mg  0.25 mg Intravenous Once Ian Evalene PARAS, MD        OBJECTIVE: Vitals:   09/05/24 0917  BP: 118/83  Pulse: 81  Resp: 20  Temp: 98.6 F (37 C)  SpO2: 100%     Body mass index is 36.55 kg/m.    ECOG FS:0 - Asymptomatic  General: Well-developed, well-nourished, no acute distress. Eyes: Pink conjunctiva, anicteric sclera. HEENT: Normocephalic, moist mucous membranes. Lungs: No audible wheezing or coughing. Heart: Regular rate and rhythm. Abdomen: Soft, nontender, no obvious distention. Musculoskeletal: No edema, cyanosis, or clubbing. Neuro: Alert, answering all questions appropriately. Cranial nerves grossly intact. Skin: No rashes or petechiae noted. Psych: Normal affect.  LAB RESULTS:  Lab Results  Component Value Date   NA 135 09/05/2024   K 4.1 09/05/2024   CL 103 09/05/2024   CO2 23 09/05/2024   GLUCOSE 105 (H) 09/05/2024   BUN 9 09/05/2024   CREATININE 0.84 09/05/2024   CALCIUM  8.5 (L) 09/05/2024   PROT 6.3 (L) 09/05/2024   ALBUMIN 3.8 09/05/2024   AST 19 09/05/2024   ALT 16 09/05/2024   ALKPHOS 61 09/05/2024   BILITOT 0.3 09/05/2024   GFRNONAA >60 09/05/2024  GFRAA >60  09/05/2020    Lab Results  Component Value Date   WBC 5.8 09/05/2024   NEUTROABS 4.4 09/05/2024   HGB 13.5 09/05/2024   HCT 42.1 09/05/2024   MCV 85.1 09/05/2024   PLT 190 09/05/2024     STUDIES: No results found.   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  Unclear if this is recurrent disease or a second primary.  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  Despite recommendation, patient declined surgery or any other treatments at that time.  He had been monitored at [name] with frequent endoscopies without evidence of recurrent or progression of disease.  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  There is also central calcification reported.  This was not present at his most recent imaging 7 years ago in 2018.  PET scan results from May 27, 2024 confirmed hypermetabolism of lesion, but no other evidence of disease.  Biopsy confirmed malignancy.  After a lengthy discussion with the patient and his daughters, he wishes to proceed with treatment this time around and the recommended weekly carboplatin  and Taxol  along with concurrent XRT followed by possible surgical resection and a referral has been sent to [name].  Proceed with cycle 4 of weekly carboplatin  and Taxol  today.  Continue daily XRT completing in September 22, 2024.  Return to clinic in 1 week for further evaluation and consideration of cycle 5.   Headaches: Patient does not complain of this today.  He was previously instructed to increase  his fluid intake and take Tylenol  intermittently with ibuprofen . Dysphagia: Patient states he was taking Carafate  regularly with no effect.  He was given a prescription for Magic mouthwash today.  I spent a total of 30 minutes reviewing chart data, face-to-face evaluation with the patient, counseling and coordination of care as detailed above.    Patient expressed understanding and was in agreement with this plan. He also understands that He can call clinic at any time with any questions, concerns, or complaints.    Cancer Staging  Esophageal cancer [name]) Staging form: Esophagus - Adenocarcinoma, AJCC 8th Edition - Clinical stage from 06/27/2024: Stage Unknown (cTX, cN0, cM0, G3) - Signed by Ian Evalene PARAS, MD on 06/27/2024 Stage prefix: Initial diagnosis Total positive nodes: 0 Histologic grading system: 3 grade system   Evalene [name] Jacobo, MD   09/05/2024 10:06 AM

## 2024-09-06 ENCOUNTER — Ambulatory Visit
Admission: RE | Admit: 2024-09-06 | Discharge: 2024-09-06 | Disposition: A | Discharge status: Home / Self Care | Payer: MEDICAID | Source: Ambulatory Visit | Attending: Radiation Oncology | Admitting: Radiation Oncology

## 2024-09-06 ENCOUNTER — Other Ambulatory Visit: Payer: Self-pay

## 2024-09-06 DIAGNOSIS — Z510 Encounter for antineoplastic radiation therapy: Secondary | ICD-10-CM | POA: Diagnosis not present

## 2024-09-06 LAB — RAD ONC ARIA SESSION SUMMARY
Course Elapsed Days: 26
Plan Fractions Treated to Date: 18
Plan Prescribed Dose Per Fraction: 1.8 Gy
Plan Total Fractions Prescribed: 30
Plan Total Prescribed Dose: 54 Gy
Reference Point Dosage Given to Date: 32.4 Gy
Reference Point Session Dosage Given: 1.8 Gy
Session Number: 17

## 2024-09-07 ENCOUNTER — Other Ambulatory Visit: Payer: Self-pay

## 2024-09-07 ENCOUNTER — Inpatient Hospital Stay: Payer: MEDICAID

## 2024-09-07 ENCOUNTER — Ambulatory Visit
Admission: RE | Admit: 2024-09-07 | Discharge: 2024-09-07 | Disposition: A | Discharge status: Home / Self Care | Payer: MEDICAID | Source: Ambulatory Visit | Attending: Radiation Oncology | Admitting: Radiation Oncology

## 2024-09-07 DIAGNOSIS — Z510 Encounter for antineoplastic radiation therapy: Secondary | ICD-10-CM | POA: Diagnosis not present

## 2024-09-07 LAB — RAD ONC ARIA SESSION SUMMARY
Course Elapsed Days: 27
Plan Fractions Treated to Date: 19
Plan Prescribed Dose Per Fraction: 1.8 Gy
Plan Total Fractions Prescribed: 30
Plan Total Prescribed Dose: 54 Gy
Reference Point Dosage Given to Date: 34.2 Gy
Reference Point Session Dosage Given: 1.8 Gy
Session Number: 18

## 2024-09-08 ENCOUNTER — Ambulatory Visit
Admission: RE | Admit: 2024-09-08 | Discharge: 2024-09-08 | Disposition: A | Discharge status: Home / Self Care | Payer: MEDICAID | Source: Ambulatory Visit | Attending: Radiation Oncology | Admitting: Radiation Oncology

## 2024-09-08 ENCOUNTER — Other Ambulatory Visit: Payer: Self-pay

## 2024-09-08 DIAGNOSIS — Z510 Encounter for antineoplastic radiation therapy: Secondary | ICD-10-CM | POA: Diagnosis not present

## 2024-09-08 LAB — RAD ONC ARIA SESSION SUMMARY
Course Elapsed Days: 28
Plan Fractions Treated to Date: 20
Plan Prescribed Dose Per Fraction: 1.8 Gy
Plan Total Fractions Prescribed: 30
Plan Total Prescribed Dose: 54 Gy
Reference Point Dosage Given to Date: 36 Gy
Reference Point Session Dosage Given: 1.8 Gy
Session Number: 19

## 2024-09-09 ENCOUNTER — Ambulatory Visit
Admission: RE | Admit: 2024-09-09 | Discharge: 2024-09-09 | Disposition: A | Discharge status: Home / Self Care | Payer: MEDICAID | Source: Ambulatory Visit | Attending: Radiation Oncology | Admitting: Radiation Oncology

## 2024-09-09 ENCOUNTER — Other Ambulatory Visit: Payer: Self-pay

## 2024-09-09 DIAGNOSIS — Z510 Encounter for antineoplastic radiation therapy: Secondary | ICD-10-CM | POA: Diagnosis not present

## 2024-09-09 LAB — RAD ONC ARIA SESSION SUMMARY
Course Elapsed Days: 29
Plan Fractions Treated to Date: 21
Plan Prescribed Dose Per Fraction: 1.8 Gy
Plan Total Fractions Prescribed: 30
Plan Total Prescribed Dose: 54 Gy
Reference Point Dosage Given to Date: 37.8 Gy
Reference Point Session Dosage Given: 1.8 Gy
Session Number: 20

## 2024-09-12 ENCOUNTER — Inpatient Hospital Stay (HOSPITAL_BASED_OUTPATIENT_CLINIC_OR_DEPARTMENT_OTHER): Payer: MEDICAID | Admitting: Oncology

## 2024-09-12 ENCOUNTER — Inpatient Hospital Stay: Payer: MEDICAID

## 2024-09-12 ENCOUNTER — Other Ambulatory Visit: Payer: Self-pay

## 2024-09-12 ENCOUNTER — Ambulatory Visit
Admission: RE | Admit: 2024-09-12 | Discharge: 2024-09-12 | Disposition: A | Discharge status: Home / Self Care | Payer: MEDICAID | Source: Ambulatory Visit | Attending: Radiation Oncology | Admitting: Radiation Oncology

## 2024-09-12 ENCOUNTER — Encounter: Payer: Self-pay | Admitting: Oncology

## 2024-09-12 VITALS — BP 116/80 | HR 79 | Resp 18

## 2024-09-12 VITALS — BP 111/86 | HR 86 | Temp 98.6°F | Resp 18 | Wt 258.8 lb

## 2024-09-12 DIAGNOSIS — C155 Malignant neoplasm of lower third of esophagus: Secondary | ICD-10-CM

## 2024-09-12 DIAGNOSIS — Z510 Encounter for antineoplastic radiation therapy: Secondary | ICD-10-CM | POA: Diagnosis not present

## 2024-09-12 LAB — CBC WITH DIFFERENTIAL (CANCER CENTER ONLY)
Abs Immature Granulocytes: 0.05 K/uL (ref 0.00–0.07)
Basophils Absolute: 0.1 K/uL (ref 0.0–0.1)
Basophils Relative: 1 %
Eosinophils Absolute: 0.1 K/uL (ref 0.0–0.5)
Eosinophils Relative: 1 %
HCT: 43.3 % (ref 39.0–52.0)
Hemoglobin: 14.3 g/dL (ref 13.0–17.0)
Immature Granulocytes: 1 %
Lymphocytes Relative: 12 %
Lymphs Abs: 0.7 K/uL (ref 0.7–4.0)
MCH: 27.7 pg (ref 26.0–34.0)
MCHC: 33 g/dL (ref 30.0–36.0)
MCV: 83.9 fL (ref 80.0–100.0)
Monocytes Absolute: 0.4 K/uL (ref 0.1–1.0)
Monocytes Relative: 6 %
Neutro Abs: 4.7 K/uL (ref 1.7–7.7)
Neutrophils Relative %: 79 %
Platelet Count: 141 K/uL — ABNORMAL LOW (ref 150–400)
RBC: 5.16 MIL/uL (ref 4.22–5.81)
RDW: 14 % (ref 11.5–15.5)
WBC Count: 6 K/uL (ref 4.0–10.5)
nRBC: 0 % (ref 0.0–0.2)

## 2024-09-12 LAB — RAD ONC ARIA SESSION SUMMARY
Course Elapsed Days: 32
Plan Fractions Treated to Date: 22
Plan Prescribed Dose Per Fraction: 1.8 Gy
Plan Total Fractions Prescribed: 30
Plan Total Prescribed Dose: 54 Gy
Reference Point Dosage Given to Date: 39.6 Gy
Reference Point Session Dosage Given: 1.8 Gy
Session Number: 21

## 2024-09-12 LAB — CMP (CANCER CENTER ONLY)
ALT: 20 U/L (ref 0–44)
AST: 19 U/L (ref 15–41)
Albumin: 4 g/dL (ref 3.5–5.0)
Alkaline Phosphatase: 61 U/L (ref 38–126)
Anion gap: 8 (ref 5–15)
BUN: 22 mg/dL (ref 8–23)
CO2: 22 mmol/L (ref 22–32)
Calcium: 8.9 mg/dL (ref 8.9–10.3)
Chloride: 102 mmol/L (ref 98–111)
Creatinine: 1.1 mg/dL (ref 0.61–1.24)
GFR, Estimated: >60 mL/min (ref >60)
Glucose, Bld: 91 mg/dL (ref 70–99)
Potassium: 4.2 mmol/L (ref 3.5–5.1)
Sodium: 132 mmol/L — ABNORMAL LOW (ref 135–145)
Total Bilirubin: 0.9 mg/dL (ref 0.0–1.2)
Total Protein: 6.6 g/dL (ref 6.5–8.1)

## 2024-09-12 MED ORDER — SODIUM CHLORIDE 0.9 % IV SOLN
50.0000 mg/m2 | Freq: Once | INTRAVENOUS | Status: AC
  Administered 2024-09-12: 126 mg via INTRAVENOUS
  Filled 2024-09-12: qty 21

## 2024-09-12 MED ORDER — SODIUM CHLORIDE 0.9 % IV SOLN
INTRAVENOUS | Status: DC
  Filled 2024-09-12: qty 250

## 2024-09-12 MED ORDER — DEXAMETHASONE SODIUM PHOSPHATE 10 MG/ML IJ SOLN
10.0000 mg | Freq: Once | INTRAMUSCULAR | Status: AC
  Administered 2024-09-12: 10 mg via INTRAVENOUS
  Filled 2024-09-12: qty 1

## 2024-09-12 MED ORDER — SODIUM CHLORIDE 0.9 % IV SOLN
287.6000 mg | Freq: Once | INTRAVENOUS | Status: AC
  Administered 2024-09-12: 290 mg via INTRAVENOUS
  Filled 2024-09-12: qty 29

## 2024-09-12 MED ORDER — PALONOSETRON HCL INJECTION 0.25 MG/5ML
0.2500 mg | Freq: Once | INTRAVENOUS | Status: AC
  Administered 2024-09-12: 0.25 mg via INTRAVENOUS
  Filled 2024-09-12: qty 5

## 2024-09-12 MED ORDER — FAMOTIDINE IN NACL 20-0.9 MG/50ML-% IV SOLN
20.0000 mg | Freq: Once | INTRAVENOUS | Status: AC
  Administered 2024-09-12: 20 mg via INTRAVENOUS
  Filled 2024-09-12: qty 50

## 2024-09-12 MED ORDER — DIPHENHYDRAMINE HCL 50 MG/ML IJ SOLN
25.0000 mg | Freq: Once | INTRAMUSCULAR | Status: AC
  Administered 2024-09-12: 25 mg via INTRAVENOUS
  Filled 2024-09-12: qty 1

## 2024-09-12 NOTE — Patient Instructions (Signed)

## 2024-09-12 NOTE — Progress Notes (Signed)
 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  INTERVAL HISTORY: Patient returns to clinic today for further evaluation and consideration of cycle 5 of weekly carboplatin  and Taxol  along with his daily XRT.  He currently feels well and is asymptomatic.  He has not complained of headache or other neurologic complaints today.  His dysphagia is significantly improved with the use of Magic mouthwash.  He does not complain of pain today.  He has no neurologic complaints. He denies any recent fevers or illnesses.  He has a good appetite and denies weight loss.  He has no chest pain, shortness of breath, cough, or hemoptysis.  He denies any nausea, vomiting, constipation, or diarrhea.  He has no urinary complaints.  Patient offers no specific complaints today.  REVIEW OF SYSTEMS:   Review of Systems  Constitutional: Negative.  Negative for fever, malaise/fatigue and weight loss.  HENT:  Negative for sore throat.   Respiratory: Negative.  Negative for cough, hemoptysis and shortness of breath.   Cardiovascular: Negative.  Negative for chest pain and leg swelling.  Gastrointestinal: Negative.  Negative for abdominal pain.  Genitourinary: Negative.  Negative for dysuria.  Musculoskeletal: Negative.  Negative for back pain.  Skin: Negative.  Negative for rash.  Neurological: Negative.  Negative for dizziness, focal weakness, weakness and headaches.  Psychiatric/Behavioral: Negative.  The patient is not nervous/anxious.     As per HPI. Otherwise, a complete review of systems is negative.  PAST MEDICAL  HISTORY: Past Medical History:  Diagnosis Date   Anxiety    Barrett esophagus    Bipolar disorder (HCC)    Cancer (HCC)    Chronic hepatitis C (HCC)    COPD (chronic obstructive pulmonary disease) (HCC)    Coronary artery disease    Depression    Diabetes mellitus without complication (HCC)    Dyslipidemia    GERD (gastroesophageal reflux disease)    Hepatitis    Hypertension    Leukocytosis    Pre-diabetes    Primary malignant neoplasm (HCC)    Stroke (HCC) 2014   mini-stroke per patient    PAST SURGICAL HISTORY: Past Surgical History:  Procedure Laterality Date   ANGIOPLASTY     APPENDECTOMY     BIOPSY  12/28/2023   Procedure: BIOPSY;  Surgeon: Onita Elspeth Sharper, DO;  Location: [facility] ENDOSCOPY;  Service: Gastroenterology;;   CARDIAC CATHETERIZATION     CHOLECYSTECTOMY     COLONOSCOPY WITH PROPOFOL  N/A 12/28/2023   Procedure: COLONOSCOPY WITH PROPOFOL ;  Surgeon: Onita Elspeth Sharper, DO;  Location: [name];  Service: Gastroenterology;  Laterality: N/A;   CORONARY ANGIOPLASTY     ESOPHAGOGASTRODUODENOSCOPY N/A 12/28/2023   Procedure: ESOPHAGOGASTRODUODENOSCOPY (EGD);  Surgeon: Onita Elspeth Sharper, DO;  Location: [name] ENDOSCOPY;  Service: Gastroenterology;  Laterality: N/A;   HEMOSTASIS CLIP PLACEMENT  12/28/2023   Procedure: HEMOSTASIS CLIP PLACEMENT;  Surgeon: Onita Elspeth Sharper, DO;  Location: [facility] ENDOSCOPY;  Service: Gastroenterology;;   POLYPECTOMY  12/28/2023   Procedure: POLYPECTOMY;  Surgeon: Onita Elspeth Sharper, DO;  Location: [name] ENDOSCOPY;  Service: Gastroenterology;;   WRIST SURGERY Right    age 72    FAMILY HISTORY: Family History  Problem Relation Age  of Onset   Stroke Father    Leukemia Father    Breast cancer Sister     ADVANCED DIRECTIVES (Y/N):  N  HEALTH MAINTENANCE: Social History   Tobacco Use   Smoking status: Every Day    Current packs/day: 1.50    Types: Cigarettes   Smokeless tobacco: Never  Vaping Use   Vaping status:  Never Used  Substance Use Topics   Alcohol use: Yes    Comment: occasional   Drug use: Yes    Types: Marijuana     Colonoscopy:  PAP:  Bone density:  Lipid panel:  Allergies  Allergen Reactions   Fluoxetine Swelling    Anxiety, itching   Hydrocodone -Acetaminophen  Itching   Mirtazapine  Other (See Comments)    nightmares   Hydrochlorothiazide  Other (See Comments)    LEG CRAMPING HYPOMAGNESEMIA   Tramadol Other (See Comments)   Gabapentin  Other (See Comments)    Nightmares     Current Outpatient Medications  Medication Sig Dispense Refill   albuterol  (VENTOLIN  HFA) 108 (90 Base) MCG/ACT inhaler Inhale 2 puffs into the lungs every 6 (six) hours as needed for wheezing or shortness of breath. 18 g 1   alum & mag hydroxide-simeth (MAALOX PLUS) 400-400-40 MG/5ML suspension Take 10 mLs by mouth every 6 (six) hours as needed for indigestion. 355 mL 0   amLODipine  (NORVASC ) 10 MG tablet Take 10 mg by mouth daily.     aspirin  81 MG chewable tablet Chew 1 tablet (81 mg total) by mouth daily. 30 tablet 1   atorvastatin  (LIPITOR) 40 MG tablet Take 1 tablet (40 mg total) by mouth daily. 30 tablet 1   busPIRone  (BUSPAR ) 10 MG tablet Take 2 tablets (20 mg total) by mouth 3 (three) times daily. 180 tablet 1   clonazePAM  (KLONOPIN ) 0.5 MG tablet Take 1 tablet (0.5 mg total) by mouth 2 (two) times daily as needed (Anxiety). 60 tablet 1   cyclobenzaprine  (FLEXERIL ) 10 MG tablet Take 1 tablet (10 mg total) by mouth 2 (two) times daily as needed for muscle spasms. 20 tablet 0   escitalopram  (LEXAPRO ) 20 MG tablet Take 1 tablet (20 mg total) by mouth daily. 30 tablet 1   famotidine  (PEPCID ) 20 MG tablet Take 20 mg by mouth 2 (two) times daily.     fluticasone-salmeterol (ADVAIR ) 500-50 MCG/ACT AEPB Inhale 1 puff into the lungs in the morning and at bedtime.     hydrOXYzine  (ATARAX /VISTARIL ) 25 MG tablet Take 1 tablet (25 mg total) by mouth 3 (three) times daily as needed for anxiety. 90 tablet 1    ibuprofen  (ADVIL ) 600 MG tablet Take 600 mg by mouth 3 (three) times daily.     lisinopril  (ZESTRIL ) 20 MG tablet Take 1 tablet (20 mg total) by mouth daily. 30 tablet 1   magic mouthwash (multi-ingredient) oral suspension Swish and swallow 5-10 mLs 4 (four) times daily as needed. 480 mL 3   magnesium  oxide (MAG-OX) 400 (240 Mg) MG tablet Take 400 mg by mouth 2 (two) times daily. TAKE 4 TABLETS BY MOUTH 2 TIMES DAILY     metFORMIN  (GLUCOPHAGE ) 1000 MG tablet Take 1 tablet (1,000 mg total) by mouth 2 (two) times daily with a meal. 60 tablet 1   mometasone -formoterol  (DULERA ) 200-5 MCG/ACT AERO Inhale 2 puffs into the lungs 2 (two) times daily. 1 each 1   ondansetron  (ZOFRAN ) 8 MG tablet Take 1 tablet (8 mg total) by mouth every 8 (eight) hours as needed for nausea or vomiting. Start  on the third day after chemotherapy. 60 tablet 1   pantoprazole  (PROTONIX ) 40 MG tablet Take 1 tablet (40 mg total) by mouth daily. 30 tablet 1   pramipexole (MIRAPEX) 0.5 MG tablet Take 0.5 mg by mouth at bedtime.     predniSONE  (DELTASONE ) 10 MG tablet Take 1 tablet (10 mg total) by mouth daily. Day 1-2: Take 50 mg  ( 5 pills) Day 3-4 : Take 40 mg (4pills) Day 5-6: Take 30 mg (3 pills) Day 7-8:  Take 20 mg (2 pills) Day 9:  Take 10mg  (1 pill) 29 tablet 0   prochlorperazine  (COMPAZINE ) 10 MG tablet Take 1 tablet (10 mg total) by mouth every 6 (six) hours as needed for nausea or vomiting. 60 tablet 1   QUEtiapine  (SEROQUEL  XR) 400 MG 24 hr tablet Take 1 tablet (400 mg total) by mouth at bedtime. 30 tablet 1   rOPINIRole (REQUIP XL) 2 MG 24 hr tablet Take by mouth.     sucralfate  (CARAFATE ) 1 g tablet Take 1 tablet (1 g total) by mouth 3 (three) times daily before meals. Dissolve tablet in 4 table spoons of warm water  swish and swallow 30 minutes before meals. 90 tablet 0   tiotropium (SPIRIVA) 18 MCG inhalation capsule Place 18 mcg into inhaler and inhale daily.     zolpidem  (AMBIEN ) 10 MG tablet Take 1 tablet (10 mg total)  by mouth at bedtime as needed for sleep. 30 tablet 1   No current facility-administered medications for this visit.    OBJECTIVE: Vitals:   09/12/24 0912  BP: 111/86  Pulse: 86  Resp: 18  Temp: 98.6 F (37 C)  SpO2: 96%     Body mass index is 35.1 kg/m.    ECOG FS:0 - Asymptomatic  General: Well-developed, well-nourished, no acute distress. Eyes: Pink conjunctiva, anicteric sclera. HEENT: Normocephalic, moist mucous membranes. Lungs: No audible wheezing or coughing. Heart: Regular rate and rhythm. Abdomen: Soft, nontender, no obvious distention. Musculoskeletal: No edema, cyanosis, or clubbing. Neuro: Alert, answering all questions appropriately. Cranial nerves grossly intact. Skin: No rashes or petechiae noted. Psych: Normal affect.  LAB RESULTS:  Lab Results  Component Value Date   NA 132 (L) 09/12/2024   K 4.2 09/12/2024   CL 102 09/12/2024   CO2 22 09/12/2024   GLUCOSE 91 09/12/2024   BUN 22 09/12/2024   CREATININE 1.10 09/12/2024   CALCIUM  8.9 09/12/2024   PROT 6.6 09/12/2024   ALBUMIN 4.0 09/12/2024   AST 19 09/12/2024   ALT 20 09/12/2024   ALKPHOS 61 09/12/2024   BILITOT 0.9 09/12/2024   GFRNONAA >60 09/12/2024   GFRAA >60 09/05/2020    Lab Results  Component Value Date   WBC 6.0 09/12/2024   NEUTROABS 4.7 09/12/2024   HGB 14.3 09/12/2024   HCT 43.3 09/12/2024   MCV 83.9 09/12/2024   PLT 141 (L) 09/12/2024     STUDIES: No results found.   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  Unclear if this is recurrent disease or a second primary.  Patient was initially diagnosed with signet cell adenocarcinoma of the esophagus in 2013 and treated with  endoscopic mucosal resection.  Despite recommendation, patient declined surgery or any other treatments at  that time.  He had been monitored at [facility] with frequent endoscopies without evidence of recurrent or progression of disease.  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  There is also central calcification reported.  This was not present at his most recent imaging 7 years ago in 2018.  PET scan results from May 27, 2024 confirmed hypermetabolism of lesion, but no other evidence of disease.  Biopsy confirmed malignancy.  After a lengthy discussion with the patient and his daughters, he wishes to proceed with treatment this time around and the recommended weekly carboplatin  and Taxol  along with concurrent XRT followed by possible surgical resection and a referral has been sent to [facility].  Proceed with cycle 5 of weekly carboplatin  and Taxol  today.  Continue daily XRT completing treatment on September 22, 2024.  Return to clinic in 1 week for further evaluation and consideration of the sixth and final chemotherapy treatment.  Headaches: Resolved.  He was previously instructed to increase his fluid intake and take Tylenol  intermittently with ibuprofen . Dysphagia: Resolved.  Continue Carafate  and Magic mouthwash as needed. Hyponatremia: Mild, monitor.  Patient sodium levels 132 today. Thrombocytopenia: Mild.  Patient's platelet count is 141 today.   Patient expressed understanding and was in agreement with this plan. He also understands that He can call clinic at any time with any questions, concerns, or complaints.    Cancer Staging  Esophageal cancer [facility]) Staging form: Esophagus - Adenocarcinoma, AJCC 8th Edition - Clinical stage from 06/27/2024: Stage Unknown (cTX, cN0, cM0, G3) - Signed by Jacobo Evalene PARAS, MD on 06/27/2024 Stage prefix: Initial diagnosis Total positive nodes: 0 Histologic  grading system: 3 grade system   Evalene PARAS Jacobo, MD   09/12/2024 9:44 AM

## 2024-09-13 ENCOUNTER — Ambulatory Visit
Admission: RE | Admit: 2024-09-13 | Discharge: 2024-09-13 | Disposition: A | Discharge status: Home / Self Care | Payer: MEDICAID | Source: Ambulatory Visit | Attending: Radiation Oncology | Admitting: Radiation Oncology

## 2024-09-13 ENCOUNTER — Other Ambulatory Visit: Payer: Self-pay

## 2024-09-13 DIAGNOSIS — Z510 Encounter for antineoplastic radiation therapy: Secondary | ICD-10-CM | POA: Diagnosis not present

## 2024-09-13 LAB — RAD ONC ARIA SESSION SUMMARY
Course Elapsed Days: 33
Plan Fractions Treated to Date: 23
Plan Prescribed Dose Per Fraction: 1.8 Gy
Plan Total Fractions Prescribed: 30
Plan Total Prescribed Dose: 54 Gy
Reference Point Dosage Given to Date: 41.4 Gy
Reference Point Session Dosage Given: 1.8 Gy
Session Number: 22

## 2024-09-14 ENCOUNTER — Inpatient Hospital Stay: Payer: MEDICAID

## 2024-09-14 ENCOUNTER — Other Ambulatory Visit: Payer: Self-pay

## 2024-09-14 ENCOUNTER — Ambulatory Visit
Admission: RE | Admit: 2024-09-14 | Discharge: 2024-09-14 | Discharge status: Home / Self Care | Payer: MEDICAID | Source: Ambulatory Visit | Attending: Radiation Oncology | Admitting: Radiation Oncology

## 2024-09-14 DIAGNOSIS — Z510 Encounter for antineoplastic radiation therapy: Secondary | ICD-10-CM | POA: Diagnosis not present

## 2024-09-14 LAB — RAD ONC ARIA SESSION SUMMARY
Course Elapsed Days: 34
Plan Fractions Treated to Date: 24
Plan Prescribed Dose Per Fraction: 1.8 Gy
Plan Total Fractions Prescribed: 30
Plan Total Prescribed Dose: 54 Gy
Reference Point Dosage Given to Date: 43.2 Gy
Reference Point Session Dosage Given: 1.8 Gy
Session Number: 23

## 2024-09-15 ENCOUNTER — Other Ambulatory Visit: Payer: Self-pay

## 2024-09-15 ENCOUNTER — Ambulatory Visit
Admission: RE | Admit: 2024-09-15 | Discharge: 2024-09-15 | Disposition: A | Discharge status: Home / Self Care | Payer: MEDICAID | Source: Ambulatory Visit | Attending: Radiation Oncology | Admitting: Radiation Oncology

## 2024-09-15 DIAGNOSIS — Z510 Encounter for antineoplastic radiation therapy: Secondary | ICD-10-CM | POA: Diagnosis not present

## 2024-09-15 LAB — RAD ONC ARIA SESSION SUMMARY
Course Elapsed Days: 35
Plan Fractions Treated to Date: 25
Plan Prescribed Dose Per Fraction: 1.8 Gy
Plan Total Fractions Prescribed: 30
Plan Total Prescribed Dose: 54 Gy
Reference Point Dosage Given to Date: 45 Gy
Reference Point Session Dosage Given: 1.8 Gy
Session Number: 24

## 2024-09-16 ENCOUNTER — Ambulatory Visit
Admission: RE | Admit: 2024-09-16 | Discharge: 2024-09-16 | Disposition: A | Discharge status: Home / Self Care | Payer: MEDICAID | Source: Ambulatory Visit | Attending: Radiation Oncology | Admitting: Radiation Oncology

## 2024-09-16 ENCOUNTER — Other Ambulatory Visit: Payer: Self-pay

## 2024-09-16 DIAGNOSIS — Z510 Encounter for antineoplastic radiation therapy: Secondary | ICD-10-CM | POA: Diagnosis not present

## 2024-09-16 LAB — RAD ONC ARIA SESSION SUMMARY
Course Elapsed Days: 36
Plan Fractions Treated to Date: 26
Plan Prescribed Dose Per Fraction: 1.8 Gy
Plan Total Fractions Prescribed: 30
Plan Total Prescribed Dose: 54 Gy
Reference Point Dosage Given to Date: 46.8 Gy
Reference Point Session Dosage Given: 1.8 Gy
Session Number: 25

## 2024-09-19 ENCOUNTER — Inpatient Hospital Stay: Payer: MEDICAID

## 2024-09-19 ENCOUNTER — Encounter: Payer: Self-pay | Admitting: Oncology

## 2024-09-19 ENCOUNTER — Inpatient Hospital Stay: Payer: MEDICAID | Admitting: Oncology

## 2024-09-19 ENCOUNTER — Other Ambulatory Visit: Payer: Self-pay

## 2024-09-19 ENCOUNTER — Ambulatory Visit
Admission: RE | Admit: 2024-09-19 | Discharge: 2024-09-19 | Disposition: A | Discharge status: Home / Self Care | Payer: MEDICAID | Source: Ambulatory Visit | Attending: Radiation Oncology | Admitting: Radiation Oncology

## 2024-09-19 VITALS — BP 105/77 | HR 86 | Temp 97.9°F | Resp 18 | Ht 72.0 in | Wt 258.0 lb

## 2024-09-19 VITALS — BP 125/76 | HR 76

## 2024-09-19 DIAGNOSIS — C155 Malignant neoplasm of lower third of esophagus: Secondary | ICD-10-CM

## 2024-09-19 DIAGNOSIS — Z510 Encounter for antineoplastic radiation therapy: Secondary | ICD-10-CM | POA: Diagnosis not present

## 2024-09-19 LAB — RAD ONC ARIA SESSION SUMMARY
Course Elapsed Days: 39
Plan Fractions Treated to Date: 27
Plan Prescribed Dose Per Fraction: 1.8 Gy
Plan Total Fractions Prescribed: 30
Plan Total Prescribed Dose: 54 Gy
Reference Point Dosage Given to Date: 48.6 Gy
Reference Point Session Dosage Given: 1.8 Gy
Session Number: 26

## 2024-09-19 LAB — CBC WITH DIFFERENTIAL (CANCER CENTER ONLY)
Abs Immature Granulocytes: 0.05 K/uL (ref 0.00–0.07)
Basophils Absolute: 0 K/uL (ref 0.0–0.1)
Basophils Relative: 1 %
Eosinophils Absolute: 0.1 K/uL (ref 0.0–0.5)
Eosinophils Relative: 1 %
HCT: 40.7 % (ref 39.0–52.0)
Hemoglobin: 13.6 g/dL (ref 13.0–17.0)
Immature Granulocytes: 1 %
Lymphocytes Relative: 11 %
Lymphs Abs: 0.5 K/uL — ABNORMAL LOW (ref 0.7–4.0)
MCH: 27.6 pg (ref 26.0–34.0)
MCHC: 33.4 g/dL (ref 30.0–36.0)
MCV: 82.6 fL (ref 80.0–100.0)
Monocytes Absolute: 0.3 K/uL (ref 0.1–1.0)
Monocytes Relative: 7 %
Neutro Abs: 3.7 K/uL (ref 1.7–7.7)
Neutrophils Relative %: 79 %
Platelet Count: 114 K/uL — ABNORMAL LOW (ref 150–400)
RBC: 4.93 MIL/uL (ref 4.22–5.81)
RDW: 14.4 % (ref 11.5–15.5)
WBC Count: 4.7 K/uL (ref 4.0–10.5)
nRBC: 0 % (ref 0.0–0.2)

## 2024-09-19 LAB — CMP (CANCER CENTER ONLY)
ALT: 33 U/L (ref 0–44)
AST: 26 U/L (ref 15–41)
Albumin: 3.9 g/dL (ref 3.5–5.0)
Alkaline Phosphatase: 64 U/L (ref 38–126)
Anion gap: 8 (ref 5–15)
BUN: 14 mg/dL (ref 8–23)
CO2: 23 mmol/L (ref 22–32)
Calcium: 8.9 mg/dL (ref 8.9–10.3)
Chloride: 102 mmol/L (ref 98–111)
Creatinine: 1.02 mg/dL (ref 0.61–1.24)
GFR, Estimated: >60 mL/min (ref >60)
Glucose, Bld: 96 mg/dL (ref 70–99)
Potassium: 4.8 mmol/L (ref 3.5–5.1)
Sodium: 133 mmol/L — ABNORMAL LOW (ref 135–145)
Total Bilirubin: 0.6 mg/dL (ref 0.0–1.2)
Total Protein: 6.5 g/dL (ref 6.5–8.1)

## 2024-09-19 MED ORDER — FAMOTIDINE IN NACL 20-0.9 MG/50ML-% IV SOLN
20.0000 mg | Freq: Once | INTRAVENOUS | Status: AC
  Administered 2024-09-19: 20 mg via INTRAVENOUS
  Filled 2024-09-19: qty 50

## 2024-09-19 MED ORDER — PALONOSETRON HCL INJECTION 0.25 MG/5ML
0.2500 mg | Freq: Once | INTRAVENOUS | Status: AC
  Administered 2024-09-19: 0.25 mg via INTRAVENOUS
  Filled 2024-09-19: qty 5

## 2024-09-19 MED ORDER — SODIUM CHLORIDE 0.9 % IV SOLN
50.0000 mg/m2 | Freq: Once | INTRAVENOUS | Status: AC
  Administered 2024-09-19: 126 mg via INTRAVENOUS
  Filled 2024-09-19: qty 21

## 2024-09-19 MED ORDER — DIPHENHYDRAMINE HCL 50 MG/ML IJ SOLN
25.0000 mg | Freq: Once | INTRAMUSCULAR | Status: AC
  Administered 2024-09-19: 25 mg via INTRAVENOUS
  Filled 2024-09-19: qty 1

## 2024-09-19 MED ORDER — DEXAMETHASONE SODIUM PHOSPHATE 10 MG/ML IJ SOLN
10.0000 mg | Freq: Once | INTRAMUSCULAR | Status: AC
  Administered 2024-09-19: 10 mg via INTRAVENOUS
  Filled 2024-09-19: qty 1

## 2024-09-19 MED ORDER — SODIUM CHLORIDE 0.9 % IV SOLN
INTRAVENOUS | Status: DC
  Filled 2024-09-19 (×2): qty 250

## 2024-09-19 MED ORDER — SODIUM CHLORIDE 0.9 % IV SOLN
290.0000 mg | Freq: Once | INTRAVENOUS | Status: AC
  Administered 2024-09-19: 290 mg via INTRAVENOUS
  Filled 2024-09-19: qty 29

## 2024-09-19 NOTE — Progress Notes (Signed)
Referral sent to [facility] surgical oncology. °

## 2024-09-19 NOTE — Progress Notes (Signed)
 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  INTERVAL HISTORY: Patient returns to clinic today for further evaluation and consideration of his sixth and final treatment of weekly carboplatinum and Taxol .  He will complete XRT later this week.  He continues to feel well.  He does not complained of headache or other neurologic complaints today.  His dysphagia is significantly improved with the use of Magic mouthwash.  He does not complain of pain today.  He denies any recent fevers or illnesses.  He has a good appetite and denies weight loss.  He has no chest pain, shortness of breath, cough, or hemoptysis.  He denies any nausea, vomiting, constipation, or diarrhea.  He has no urinary complaints.  Patient offers no specific complaints today.  REVIEW OF SYSTEMS:   Review of Systems  Constitutional: Negative.  Negative for fever, malaise/fatigue and weight loss.  HENT:  Negative for sore throat.   Respiratory: Negative.  Negative for cough, hemoptysis and shortness of breath.   Cardiovascular: Negative.  Negative for chest pain and leg swelling.  Gastrointestinal: Negative.  Negative for abdominal pain.  Genitourinary: Negative.  Negative for dysuria.  Musculoskeletal: Negative.  Negative for back pain.  Skin: Negative.  Negative for rash.  Neurological: Negative.  Negative for dizziness, focal weakness, weakness and headaches.  Psychiatric/Behavioral: Negative.  The patient is not nervous/anxious.     As per HPI. Otherwise, a complete review of systems is negative.  PAST MEDICAL  HISTORY: Past Medical History:  Diagnosis Date   Anxiety    Barrett esophagus    Bipolar disorder (HCC)    Cancer (HCC)    Chronic hepatitis C (HCC)    COPD (chronic obstructive pulmonary disease) (HCC)    Coronary artery disease    Depression    Diabetes mellitus without complication (HCC)    Dyslipidemia    GERD (gastroesophageal reflux disease)    Hepatitis    Hypertension    Leukocytosis    Pre-diabetes    Primary malignant neoplasm (HCC)    Stroke (HCC) 2014   mini-stroke per patient    PAST SURGICAL HISTORY: Past Surgical History:  Procedure Laterality Date   ANGIOPLASTY     APPENDECTOMY     BIOPSY  12/28/2023   Procedure: BIOPSY;  Surgeon: Onita Elspeth Sharper, DO;  Location: [facility] ENDOSCOPY;  Service: Gastroenterology;;   CARDIAC CATHETERIZATION     CHOLECYSTECTOMY     COLONOSCOPY WITH PROPOFOL  N/A 12/28/2023   Procedure: COLONOSCOPY WITH PROPOFOL ;  Surgeon: Onita Elspeth Sharper, DO;  Location: [facility] ENDOSCOPY;  Service: Gastroenterology;  Laterality: N/A;   CORONARY ANGIOPLASTY     ESOPHAGOGASTRODUODENOSCOPY N/A 12/28/2023   Procedure: ESOPHAGOGASTRODUODENOSCOPY (EGD);  Surgeon: Onita Elspeth Sharper, DO;  Location: [facility] ENDOSCOPY;  Service: Gastroenterology;  Laterality: N/A;   HEMOSTASIS CLIP PLACEMENT  12/28/2023   Procedure: HEMOSTASIS CLIP PLACEMENT;  Surgeon: Onita Elspeth Sharper, DO;  Location: [facility] ENDOSCOPY;  Service: Gastroenterology;;   POLYPECTOMY  12/28/2023   Procedure: POLYPECTOMY;  Surgeon: Onita Elspeth Sharper, DO;  Location: [facility] ENDOSCOPY;  Service: Gastroenterology;;   WRIST SURGERY Right    age 63    FAMILY HISTORY: Family History  Problem Relation Age of  Onset   Stroke Father    Leukemia Father    Breast cancer Sister     ADVANCED DIRECTIVES (Y/N):  N  HEALTH MAINTENANCE: Social History   Tobacco Use   Smoking status: Every Day    Current packs/day: 1.50    Types: Cigarettes   Smokeless tobacco: Never  Vaping Use   Vaping status:  Never Used  Substance Use Topics   Alcohol use: Yes    Comment: occasional   Drug use: Yes    Types: Marijuana     Colonoscopy:  PAP:  Bone density:  Lipid panel:  Allergies  Allergen Reactions   Fluoxetine Swelling    Anxiety, itching   Hydrocodone -Acetaminophen  Itching   Mirtazapine  Other (See Comments)    nightmares   Hydrochlorothiazide  Other (See Comments)    LEG CRAMPING HYPOMAGNESEMIA   Tramadol Other (See Comments)   Gabapentin  Other (See Comments)    Nightmares     Current Outpatient Medications  Medication Sig Dispense Refill   albuterol  (VENTOLIN  HFA) 108 (90 Base) MCG/ACT inhaler Inhale 2 puffs into the lungs every 6 (six) hours as needed for wheezing or shortness of breath. 18 g 1   alum & mag hydroxide-simeth (MAALOX PLUS) 400-400-40 MG/5ML suspension Take 10 mLs by mouth every 6 (six) hours as needed for indigestion. 355 mL 0   amLODipine  (NORVASC ) 10 MG tablet Take 10 mg by mouth daily.     aspirin  81 MG chewable tablet Chew 1 tablet (81 mg total) by mouth daily. 30 tablet 1   atorvastatin  (LIPITOR) 40 MG tablet Take 1 tablet (40 mg total) by mouth daily. 30 tablet 1   busPIRone  (BUSPAR ) 10 MG tablet Take 2 tablets (20 mg total) by mouth 3 (three) times daily. 180 tablet 1   clonazePAM  (KLONOPIN ) 0.5 MG tablet Take 1 tablet (0.5 mg total) by mouth 2 (two) times daily as needed (Anxiety). 60 tablet 1   cyclobenzaprine  (FLEXERIL ) 10 MG tablet Take 1 tablet (10 mg total) by mouth 2 (two) times daily as needed for muscle spasms. 20 tablet 0   escitalopram  (LEXAPRO ) 20 MG tablet Take 1 tablet (20 mg total) by mouth daily. 30 tablet 1   famotidine  (PEPCID ) 20 MG tablet Take 20 mg by mouth 2 (two) times daily.     fluticasone-salmeterol (ADVAIR ) 500-50 MCG/ACT AEPB Inhale 1 puff into the lungs in the morning and at bedtime.     hydrOXYzine  (ATARAX /VISTARIL ) 25 MG tablet Take 1 tablet (25 mg total) by mouth 3 (three) times daily as needed for anxiety. 90 tablet 1    ibuprofen  (ADVIL ) 600 MG tablet Take 600 mg by mouth 3 (three) times daily.     lisinopril  (ZESTRIL ) 20 MG tablet Take 1 tablet (20 mg total) by mouth daily. 30 tablet 1   magic mouthwash (multi-ingredient) oral suspension Swish and swallow 5-10 mLs 4 (four) times daily as needed. 480 mL 3   magnesium  oxide (MAG-OX) 400 (240 Mg) MG tablet Take 400 mg by mouth 2 (two) times daily. TAKE 4 TABLETS BY MOUTH 2 TIMES DAILY     metFORMIN  (GLUCOPHAGE ) 1000 MG tablet Take 1 tablet (1,000 mg total) by mouth 2 (two) times daily with a meal. 60 tablet 1   mometasone -formoterol  (DULERA ) 200-5 MCG/ACT AERO Inhale 2 puffs into the lungs 2 (two) times daily. 1 each 1   ondansetron  (ZOFRAN ) 8 MG tablet Take 1 tablet (8 mg total) by mouth every 8 (eight) hours as needed for nausea or vomiting. Start on  the third day after chemotherapy. 60 tablet 1   pantoprazole  (PROTONIX ) 40 MG tablet Take 1 tablet (40 mg total) by mouth daily. 30 tablet 1   pramipexole (MIRAPEX) 0.5 MG tablet Take 0.5 mg by mouth at bedtime.     predniSONE  (DELTASONE ) 10 MG tablet Take 1 tablet (10 mg total) by mouth daily. Day 1-2: Take 50 mg  ( 5 pills) Day 3-4 : Take 40 mg (4pills) Day 5-6: Take 30 mg (3 pills) Day 7-8:  Take 20 mg (2 pills) Day 9:  Take 10mg  (1 pill) 29 tablet 0   prochlorperazine  (COMPAZINE ) 10 MG tablet Take 1 tablet (10 mg total) by mouth every 6 (six) hours as needed for nausea or vomiting. 60 tablet 1   QUEtiapine  (SEROQUEL  XR) 400 MG 24 hr tablet Take 1 tablet (400 mg total) by mouth at bedtime. 30 tablet 1   rOPINIRole (REQUIP XL) 2 MG 24 hr tablet Take by mouth.     sucralfate  (CARAFATE ) 1 g tablet Take 1 tablet (1 g total) by mouth 3 (three) times daily before meals. Dissolve tablet in 4 table spoons of warm water  swish and swallow 30 minutes before meals. 90 tablet 0   tiotropium (SPIRIVA) 18 MCG inhalation capsule Place 18 mcg into inhaler and inhale daily.     zolpidem  (AMBIEN ) 10 MG tablet Take 1 tablet (10 mg total)  by mouth at bedtime as needed for sleep. 30 tablet 1   No current facility-administered medications for this visit.    OBJECTIVE: Vitals:   09/19/24 0921  BP: 105/77  Pulse: 86  Resp: 18  Temp: 97.9 F (36.6 C)  SpO2: 98%     Body mass index is 34.99 kg/m.    ECOG FS:0 - Asymptomatic  General: Well-developed, well-nourished, no acute distress. Eyes: Pink conjunctiva, anicteric sclera. HEENT: Normocephalic, moist mucous membranes. Lungs: No audible wheezing or coughing. Heart: Regular rate and rhythm. Abdomen: Soft, nontender, no obvious distention. Musculoskeletal: No edema, cyanosis, or clubbing. Neuro: Alert, answering all questions appropriately. Cranial nerves grossly intact. Skin: No rashes or petechiae noted. Psych: Normal affect.  LAB RESULTS:  Lab Results  Component Value Date   NA 132 (L) 09/12/2024   K 4.2 09/12/2024   CL 102 09/12/2024   CO2 22 09/12/2024   GLUCOSE 91 09/12/2024   BUN 22 09/12/2024   CREATININE 1.10 09/12/2024   CALCIUM  8.9 09/12/2024   PROT 6.6 09/12/2024   ALBUMIN 4.0 09/12/2024   AST 19 09/12/2024   ALT 20 09/12/2024   ALKPHOS 61 09/12/2024   BILITOT 0.9 09/12/2024   GFRNONAA >60 09/12/2024   GFRAA >60 09/05/2020    Lab Results  Component Value Date   WBC 6.0 09/12/2024   NEUTROABS 4.7 09/12/2024   HGB 14.3 09/12/2024   HCT 43.3 09/12/2024   MCV 83.9 09/12/2024   PLT 141 (L) 09/12/2024     STUDIES: No results found.   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  Unclear if this is recurrent disease or a second primary.  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  Despite recommendation, patient declined surgery or any other treatments at that  time.  He had been monitored at [facility] with frequent endoscopies without evidence of recurrent or progression of disease.  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  There is also central calcification reported.  This was not present at his most recent imaging 7 years ago in 2018.  PET scan results from May 27, 2024 confirmed hypermetabolism of lesion, but no other evidence of disease.  Biopsy confirmed malignancy.  After a lengthy discussion with the patient and his daughters, he wishes to proceed with treatment this time around and the recommended weekly carboplatin  and Taxol  along with concurrent XRT followed by possible surgical resection and a referral has been sent to [facility].  Proceed with his sixth and final treatment of weekly carboplatin  and Taxol  today.  Continue daily XRT, completing treatment on September 22, 2024.  Follow-up will be based on [facility] surgical oncology appointment and recommendations.    Headaches: Patient does not complain of this today.  He was previously instructed to increase his fluid intake and take Tylenol  intermittently with ibuprofen . Dysphagia: Improved.  Continue Carafate  and Magic mouthwash as needed. Hyponatremia: Chronic and unchanged.  Patient's sodium level is 133. Thrombocytopenia: Platelets have trended down slightly to 114, monitor.   Patient expressed understanding and was in agreement with this plan. He also understands that He can call clinic at any time with any questions, concerns, or complaints.    Cancer Staging  Esophageal cancer [facility]) Staging form: Esophagus - Adenocarcinoma, AJCC 8th Edition - Clinical stage from 06/27/2024: Stage Unknown (cTX, cN0, cM0, G3) - Signed by Jacobo Evalene PARAS, MD on 06/27/2024 Stage prefix: Initial  diagnosis Total positive nodes: 0 Histologic grading system: 3 grade system   Evalene PARAS Jacobo, MD   09/19/2024 9:26 AM

## 2024-09-19 NOTE — Patient Instructions (Signed)
 [facility] - A DEPT OF [facility]  Discharge Instructions: Thank you for choosing [facility] to provide your oncology and hematology care.  If you have a lab appointment with the Cancer Center, please go directly to the Cancer Center and check in at the registration area.  Wear comfortable clothing and clothing appropriate for easy access to any Portacath or PICC line.   We strive to give you quality time with your provider. You may need to reschedule your appointment if you arrive late (15 or more minutes).  Arriving late affects you and other patients whose appointments are after yours.  Also, if you miss three or more appointments without notifying the office, you may be dismissed from the clinic at the provider's discretion.      For prescription refill requests, have your pharmacy contact our office and allow 72 hours for refills to be completed.    Today you received the following chemotherapy and/or immunotherapy agents- taxol, carboplatin      To help prevent nausea and vomiting after your treatment, we encourage you to take your nausea medication as directed.  BELOW ARE SYMPTOMS THAT SHOULD BE REPORTED IMMEDIATELY: *FEVER GREATER THAN 100.4 F (38 C) OR HIGHER *CHILLS OR SWEATING *NAUSEA AND VOMITING THAT IS NOT CONTROLLED WITH YOUR NAUSEA MEDICATION *UNUSUAL SHORTNESS OF BREATH *UNUSUAL BRUISING OR BLEEDING *URINARY PROBLEMS (pain or burning when urinating, or frequent urination) *BOWEL PROBLEMS (unusual diarrhea, constipation, pain near the anus) TENDERNESS IN MOUTH AND THROAT WITH OR WITHOUT PRESENCE OF ULCERS (sore throat, sores in mouth, or a toothache) UNUSUAL RASH, SWELLING OR PAIN  UNUSUAL VAGINAL DISCHARGE OR ITCHING   Items with * indicate a potential emergency and should be followed up as soon as possible or go to the Emergency Department if any problems should occur.  Please show the CHEMOTHERAPY ALERT CARD or  IMMUNOTHERAPY ALERT CARD at check-in to the Emergency Department and triage nurse.  Should you have questions after your visit or need to cancel or reschedule your appointment, please contact [facility] - A DEPT OF [facility]  [phone] and follow the prompts.  Office hours are 8:00 a.m. to 4:30 p.m. Monday - Friday. Please note that voicemails left after 4:00 p.m. may not be returned until the following business day.  We are closed weekends and major holidays. You have access to a nurse at all times for urgent questions. Please call the main number to the clinic [phone] and follow the prompts.  For any non-urgent questions, you may also contact your provider using MyChart. We now offer e-Visits for anyone 36 and older to request care online for non-urgent symptoms. For details visit [email or web address].   Also download the MyChart app! Go to the app store, search "MyChart", open the app, select [place], and log in with your MyChart username and password.

## 2024-09-19 NOTE — Progress Notes (Signed)
 Dizzy for the past couple of weeks. Diarrhea. Still having the pain in the middle of his chest.

## 2024-09-20 ENCOUNTER — Other Ambulatory Visit: Payer: Self-pay

## 2024-09-20 ENCOUNTER — Encounter: Payer: Self-pay | Admitting: Oncology

## 2024-09-20 ENCOUNTER — Ambulatory Visit
Admission: RE | Admit: 2024-09-20 | Discharge: 2024-09-20 | Disposition: A | Payer: MEDICAID | Source: Ambulatory Visit | Attending: Radiation Oncology | Admitting: Radiation Oncology

## 2024-09-20 DIAGNOSIS — Z510 Encounter for antineoplastic radiation therapy: Secondary | ICD-10-CM | POA: Diagnosis not present

## 2024-09-20 LAB — RAD ONC ARIA SESSION SUMMARY
Course Elapsed Days: 40
Plan Fractions Treated to Date: 28
Plan Prescribed Dose Per Fraction: 1.8 Gy
Plan Total Fractions Prescribed: 30
Plan Total Prescribed Dose: 54 Gy
Reference Point Dosage Given to Date: 50.4 Gy
Reference Point Session Dosage Given: 1.8 Gy
Session Number: 27

## 2024-09-21 ENCOUNTER — Other Ambulatory Visit: Payer: Self-pay

## 2024-09-21 ENCOUNTER — Inpatient Hospital Stay: Payer: MEDICAID

## 2024-09-21 ENCOUNTER — Ambulatory Visit
Admission: RE | Admit: 2024-09-21 | Discharge: 2024-09-21 | Disposition: A | Discharge status: Home / Self Care | Payer: MEDICAID | Source: Ambulatory Visit | Attending: Radiation Oncology | Admitting: Radiation Oncology

## 2024-09-21 DIAGNOSIS — Z510 Encounter for antineoplastic radiation therapy: Secondary | ICD-10-CM | POA: Diagnosis present

## 2024-09-21 DIAGNOSIS — F17210 Nicotine dependence, cigarettes, uncomplicated: Secondary | ICD-10-CM | POA: Diagnosis not present

## 2024-09-21 DIAGNOSIS — F1290 Cannabis use, unspecified, uncomplicated: Secondary | ICD-10-CM | POA: Insufficient documentation

## 2024-09-21 DIAGNOSIS — Z79899 Other long term (current) drug therapy: Secondary | ICD-10-CM | POA: Insufficient documentation

## 2024-09-21 DIAGNOSIS — C155 Malignant neoplasm of lower third of esophagus: Secondary | ICD-10-CM | POA: Insufficient documentation

## 2024-09-21 LAB — RAD ONC ARIA SESSION SUMMARY
Course Elapsed Days: 41
Plan Fractions Treated to Date: 29
Plan Prescribed Dose Per Fraction: 1.8 Gy
Plan Total Fractions Prescribed: 30
Plan Total Prescribed Dose: 54 Gy
Reference Point Dosage Given to Date: 52.2 Gy
Reference Point Session Dosage Given: 1.8 Gy
Session Number: 28

## 2024-09-22 ENCOUNTER — Other Ambulatory Visit: Payer: Self-pay

## 2024-09-22 ENCOUNTER — Ambulatory Visit
Admission: RE | Admit: 2024-09-22 | Discharge: 2024-09-22 | Disposition: A | Discharge status: Home / Self Care | Payer: MEDICAID | Source: Ambulatory Visit | Attending: Radiation Oncology | Admitting: Radiation Oncology

## 2024-09-22 DIAGNOSIS — Z510 Encounter for antineoplastic radiation therapy: Secondary | ICD-10-CM | POA: Diagnosis not present

## 2024-09-22 LAB — RAD ONC ARIA SESSION SUMMARY
Course Elapsed Days: 42
Plan Fractions Treated to Date: 30
Plan Prescribed Dose Per Fraction: 1.8 Gy
Plan Total Fractions Prescribed: 30
Plan Total Prescribed Dose: 54 Gy
Reference Point Dosage Given to Date: 54 Gy
Reference Point Session Dosage Given: 1.8 Gy
Session Number: 29

## 2024-09-23 NOTE — Radiation Completion Notes (Signed)
 Patient Name: Ian [name], Ian [name] MRN: [id number] Date of Birth: [birth year] Referring Physician: EVALENE REUSING, M.D. Date of Service: 2024-09-23 Radiation Oncologist: Marcey Penton, M.D. [facility]                             RADIATION ONCOLOGY END OF TREATMENT NOTE     Diagnosis: C15.5 Malignant neoplasm of lower third of esophagus Staging on 2024-06-27: Esophageal cancer (HCC) T=cTX, N=cN0, M=cM0 Intent: Curative     HPI: Patient is a 63 year old male with an interesting history of signet cell adenocarcinoma of the esophagus which I believe was more upper third treated with endoscopic mucosal resections repeated times at [facility].  He had a follow-up surveillance CT scan back in June which showed a 3.4 x 3.6 x 4.3 mass at the GE junction just above the celiac trunk.  There was central calcification not seen in imaging 7 years prior.  PET scan was performed showing hypermetabolic activity of the lesion again just superior to the celiac trunk with abnormal radiotracer uptake consistent with neoplasm.  There was also a small area of increased uptake in the adjacent gastric wall the GE junction.  Upper endoscopy was performed showing Z-line irregular with nodularity primarily on the cardia side. Biopsied. Periesophageal/peri-celiac mass with central    calcification seen.  Biopsy was positive for invasive adenocarcinoma poorly differentiated HER2/neu was negative.  There was mismatch repair proteinsPreserved nuclear expression of all MLH1, PMS2, MSH2 and MSH6 proteins.  Patient has been having no significant dysphagia recently he is maintained his weight and he is borderline obese.  He has been seen by medical oncology with recommendation for weekly carboplatin  and Taxol  along with concurrent radiation followed by per possible surgical resection.  He is seen today for radiation oncology opinion.      ==========DELIVERED PLANS==========  First Treatment Date: 2024-08-11 Last  Treatment Date: 2024-09-22   Plan Name: Esoph Site: Esophagus Technique: IMRT Mode: Photon Dose Per Fraction: 1.8 Gy Prescribed Dose (Delivered / Prescribed): 54 Gy / 54 Gy Prescribed Fxs (Delivered / Prescribed): 30 / 30     ==========ON TREATMENT VISIT DATES========== 2024-08-16, 2024-08-23, 2024-08-29, 2024-09-06, 2024-09-13, 2024-09-20     ==========UPCOMING VISITS========== 10/24/2024 [facility] ONCOLOGY FOLLOW UP 30 Chrystal, Marcey, MD        ==========APPENDIX - ON TREATMENT VISIT NOTES==========   See weekly On Treatment Notes in Epic for details in the Media tab (listed as Progress notes on the On Treatment Visit Dates listed above).

## 2024-10-05 ENCOUNTER — Other Ambulatory Visit: Payer: Self-pay

## 2024-10-10 ENCOUNTER — Other Ambulatory Visit: Payer: Self-pay | Admitting: Radiation Oncology

## 2024-10-12 ENCOUNTER — Inpatient Hospital Stay: Admit: 2024-10-12 | Discharge: 2024-10-13 | Payer: Medicaid (Managed Care)

## 2024-10-12 ENCOUNTER — Ambulatory Visit: Admit: 2024-10-12 | Discharge: 2024-10-13 | Payer: Medicaid (Managed Care) | Attending: Surgery | Primary: Surgery

## 2024-10-12 DIAGNOSIS — C155 Malignant neoplasm of lower third of esophagus: Principal | ICD-10-CM

## 2024-10-12 DIAGNOSIS — J449 Chronic obstructive pulmonary disease, unspecified: Principal | ICD-10-CM

## 2024-10-17 DIAGNOSIS — C259 Malignant neoplasm of pancreas, unspecified: Principal | ICD-10-CM

## 2024-10-18 ENCOUNTER — Inpatient Hospital Stay: Admit: 2024-10-18 | Discharge: 2024-10-18 | Payer: Medicaid (Managed Care)

## 2024-10-24 ENCOUNTER — Ambulatory Visit: Payer: MEDICAID | Admitting: Radiation Oncology

## 2024-10-27 ENCOUNTER — Other Ambulatory Visit: Payer: Self-pay

## 2024-11-02 ENCOUNTER — Ambulatory Visit: Payer: MEDICAID | Admitting: Radiation Oncology

## 2024-11-02 ENCOUNTER — Other Ambulatory Visit: Payer: Self-pay

## 2024-11-02 ENCOUNTER — Ambulatory Visit: Admit: 2024-11-02 | Discharge: 2024-11-03 | Payer: Medicaid (Managed Care) | Attending: Surgery | Primary: Surgery

## 2024-11-02 ENCOUNTER — Ambulatory Visit: Admit: 2024-11-02 | Payer: Medicaid (Managed Care) | Attending: Radiation Oncology | Primary: Radiation Oncology

## 2024-11-02 ENCOUNTER — Inpatient Hospital Stay
Admit: 2024-11-02 | Discharge: 2024-11-03 | Payer: Medicaid (Managed Care) | Attending: Radiation Oncology | Primary: Radiation Oncology

## 2024-11-02 ENCOUNTER — Ambulatory Visit: Admit: 2024-11-02 | Payer: Medicaid (Managed Care)

## 2024-11-03 ENCOUNTER — Inpatient Hospital Stay: Payer: MEDICAID | Attending: Oncology | Admitting: Oncology

## 2024-11-03 ENCOUNTER — Encounter: Payer: Self-pay | Admitting: Oncology

## 2024-11-03 VITALS — BP 99/70 | HR 75 | Temp 97.8°F | Resp 18 | Ht 72.0 in | Wt 255.0 lb

## 2024-11-03 DIAGNOSIS — C155 Malignant neoplasm of lower third of esophagus: Secondary | ICD-10-CM | POA: Insufficient documentation

## 2024-11-03 DIAGNOSIS — F17210 Nicotine dependence, cigarettes, uncomplicated: Secondary | ICD-10-CM | POA: Diagnosis not present

## 2024-11-03 DIAGNOSIS — E871 Hypo-osmolality and hyponatremia: Secondary | ICD-10-CM | POA: Diagnosis not present

## 2024-11-03 DIAGNOSIS — Z803 Family history of malignant neoplasm of breast: Secondary | ICD-10-CM | POA: Insufficient documentation

## 2024-11-03 DIAGNOSIS — Z806 Family history of leukemia: Secondary | ICD-10-CM | POA: Diagnosis not present

## 2024-11-03 DIAGNOSIS — D696 Thrombocytopenia, unspecified: Secondary | ICD-10-CM | POA: Insufficient documentation

## 2024-11-03 NOTE — Progress Notes (Unsigned)
 Appetite is still not the best, he told me that it is getting better, due to him afraid of the pain coming back in near sternum. Dizzy.

## 2024-11-03 NOTE — Progress Notes (Unsigned)
 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  INTERVAL HISTORY: Patient returns to clinic today for further evaluation and consideration of his sixth and final treatment of weekly carboplatinum and Taxol .  He will complete XRT later this week.  He continues to feel well.  He does not complained of headache or other neurologic complaints today.  His dysphagia is significantly improved with the use of Magic mouthwash.  He does not complain of pain today.  He denies any recent fevers or illnesses.  He has a good appetite and denies weight loss.  He has no chest pain, shortness of breath, cough, or hemoptysis.  He denies any nausea, vomiting, constipation, or diarrhea.  He has no urinary complaints.  Patient offers no specific complaints today.  REVIEW OF SYSTEMS:   Review of Systems  Constitutional: Negative.  Negative for fever, malaise/fatigue and weight loss.  HENT:  Negative for sore throat.   Respiratory: Negative.  Negative for cough, hemoptysis and shortness of breath.   Cardiovascular: Negative.  Negative for chest pain and leg swelling.  Gastrointestinal: Negative.  Negative for abdominal pain.  Genitourinary: Negative.  Negative for dysuria.  Musculoskeletal: Negative.  Negative for back pain.  Skin: Negative.  Negative for rash.  Neurological: Negative.  Negative for dizziness, focal weakness, weakness and headaches.  Psychiatric/Behavioral: Negative.  The patient is not nervous/anxious.     As per HPI. Otherwise, a complete review of systems is negative.  PAST MEDICAL  HISTORY: Past Medical History:  Diagnosis Date  . Anxiety   . Barrett esophagus   . Bipolar disorder (HCC)   . Cancer (HCC)   . Chronic hepatitis C (HCC)   . COPD (chronic obstructive pulmonary disease) (HCC)   . Coronary artery disease   . Depression   . Diabetes mellitus without complication (HCC)   . Dyslipidemia   . GERD (gastroesophageal reflux disease)   . Hepatitis   . Hypertension   . Leukocytosis   . Pre-diabetes   . Primary malignant neoplasm (HCC)   . Stroke (HCC) 2014   mini-stroke per patient    PAST SURGICAL HISTORY: Past Surgical History:  Procedure Laterality Date  . ANGIOPLASTY    . APPENDECTOMY    . BIOPSY  12/28/2023   Procedure: BIOPSY;  Surgeon: Onita Elspeth Sharper, DO;  Location: [facility] ENDOSCOPY;  Service: Gastroenterology;;  . CARDIAC CATHETERIZATION    . CHOLECYSTECTOMY    . COLONOSCOPY WITH PROPOFOL  N/A 12/28/2023   Procedure: COLONOSCOPY WITH PROPOFOL ;  Surgeon: Onita Elspeth Sharper, DO;  Location: [facility] ENDOSCOPY;  Service: Gastroenterology;  Laterality: N/A;  . CORONARY ANGIOPLASTY    . ESOPHAGOGASTRODUODENOSCOPY N/A 12/28/2023   Procedure: ESOPHAGOGASTRODUODENOSCOPY (EGD);  Surgeon: Onita Elspeth Sharper, DO;  Location: [facility] ENDOSCOPY;  Service: Gastroenterology;  Laterality: N/A;  . HEMOSTASIS CLIP PLACEMENT  12/28/2023   Procedure: HEMOSTASIS CLIP PLACEMENT;  Surgeon: Onita Elspeth Sharper, DO;  Location: [facility] ENDOSCOPY;  Service: Gastroenterology;;  . POLYPECTOMY  12/28/2023   Procedure: POLYPECTOMY;  Surgeon: Onita Elspeth Sharper, DO;  Location: [facility] ENDOSCOPY;  Service: Gastroenterology;;  . WRIST SURGERY Right    age 63    FAMILY HISTORY: Family History  Problem Relation Age of  Onset  . Stroke Father   . Leukemia Father   . Breast cancer Sister     ADVANCED DIRECTIVES (Y/N):  N  HEALTH MAINTENANCE: Social History   Tobacco Use  . Smoking status: Every Day    Current packs/day: 1.50    Types: Cigarettes  . Smokeless tobacco:  Never  Vaping Use  . Vaping status: Never Used  Substance Use Topics  . Alcohol use: Yes    Comment: occasional  . Drug use: Yes    Types: Marijuana     Colonoscopy:  PAP:  Bone density:  Lipid panel:  Allergies  Allergen Reactions  . Fluoxetine Swelling    Anxiety, itching  . Hydrocodone -Acetaminophen  Itching  . Mirtazapine  Other (See Comments)    nightmares  . Hydrochlorothiazide  Other (See Comments)    LEG CRAMPING HYPOMAGNESEMIA  . Tramadol Other (See Comments)  . Gabapentin  Other (See Comments)    Nightmares     Current Outpatient Medications  Medication Sig Dispense Refill  . albuterol  (VENTOLIN  HFA) 108 (90 Base) MCG/ACT inhaler Inhale 2 puffs into the lungs every 6 (six) hours as needed for wheezing or shortness of breath. 18 g 1  . alum & mag hydroxide-simeth (MAALOX PLUS) 400-400-40 MG/5ML suspension Take 10 mLs by mouth every 6 (six) hours as needed for indigestion. 355 mL 0  . amLODipine  (NORVASC ) 10 MG tablet Take 10 mg by mouth daily.    . aspirin  81 MG chewable tablet Chew 1 tablet (81 mg total) by mouth daily. 30 tablet 1  . atorvastatin  (LIPITOR) 40 MG tablet Take 1 tablet (40 mg total) by mouth daily. 30 tablet 1  . busPIRone  (BUSPAR ) 10 MG tablet Take 2 tablets (20 mg total) by mouth 3 (three) times daily. 180 tablet 1  . clonazePAM  (KLONOPIN ) 0.5 MG tablet Take 1 tablet (0.5 mg total) by mouth 2 (two) times daily as needed (Anxiety). 60 tablet 1  . cyclobenzaprine  (FLEXERIL ) 10 MG tablet Take 1 tablet (10 mg total) by mouth 2 (two) times daily as needed for muscle spasms. 20 tablet 0  . escitalopram  (LEXAPRO ) 20 MG tablet Take 1 tablet (20 mg total) by mouth daily. 30 tablet 1  . famotidine  (PEPCID ) 20 MG tablet Take 20 mg by mouth 2 (two) times daily.    . fluticasone-salmeterol (ADVAIR ) 500-50 MCG/ACT AEPB Inhale 1 puff into the lungs in the morning and at bedtime.    . hydrOXYzine  (ATARAX /VISTARIL ) 25 MG tablet Take 1 tablet (25 mg total) by mouth 3  (three) times daily as needed for anxiety. 90 tablet 1  . ibuprofen  (ADVIL ) 600 MG tablet Take 600 mg by mouth 3 (three) times daily.    . lisinopril  (ZESTRIL ) 20 MG tablet Take 1 tablet (20 mg total) by mouth daily. 30 tablet 1  . magnesium  oxide (MAG-OX) 400 (240 Mg) MG tablet Take 400 mg by mouth 2 (two) times daily. TAKE 4 TABLETS BY MOUTH 2 TIMES DAILY    . metFORMIN  (GLUCOPHAGE ) 1000 MG tablet Take 1 tablet (1,000 mg total) by mouth 2 (two) times daily with a meal. 60 tablet 1  . mometasone -formoterol  (DULERA ) 200-5 MCG/ACT AERO Inhale 2 puffs into the lungs 2 (two) times daily. 1 each 1  . pantoprazole  (PROTONIX ) 40 MG tablet Take 1 tablet (40 mg total) by mouth daily. 30 tablet 1  . QUEtiapine  (SEROQUEL  XR) 400 MG 24 hr tablet Take 1 tablet (400 mg total) by mouth at bedtime. 30 tablet 1  . rOPINIRole (REQUIP XL) 2  MG 24 hr tablet Take by mouth.    . tiotropium (SPIRIVA) 18 MCG inhalation capsule Place 18 mcg into inhaler and inhale daily.    . zolpidem  (AMBIEN ) 10 MG tablet Take 1 tablet (10 mg total) by mouth at bedtime as needed for sleep. 30 tablet 1  . magic mouthwash (multi-ingredient) oral suspension Swish and swallow 5-10 mLs 4 (four) times daily as needed. (Patient not taking: Reported on 11/03/2024) 480 mL 3  . ondansetron  (ZOFRAN ) 8 MG tablet Take 1 tablet (8 mg total) by mouth every 8 (eight) hours as needed for nausea or vomiting. Start on the third day after chemotherapy. (Patient not taking: Reported on 11/03/2024) 60 tablet 1  . pramipexole (MIRAPEX) 0.5 MG tablet Take 0.5 mg by mouth at bedtime. (Patient not taking: Reported on 11/03/2024)    . predniSONE  (DELTASONE ) 10 MG tablet Take 1 tablet (10 mg total) by mouth daily. Day 1-2: Take 50 mg  ( 5 pills) Day 3-4 : Take 40 mg (4pills) Day 5-6: Take 30 mg (3 pills) Day 7-8:  Take 20 mg (2 pills) Day 9:  Take 10mg  (1 pill) (Patient not taking: Reported on 11/03/2024) 29 tablet 0  . prochlorperazine  (COMPAZINE ) 10 MG tablet Take 1  tablet (10 mg total) by mouth every 6 (six) hours as needed for nausea or vomiting. (Patient not taking: Reported on 11/03/2024) 60 tablet 1  . sucralfate  (CARAFATE ) 1 g tablet Take 1 tablet (1 g total) by mouth 3 (three) times daily before meals. Dissolve tablet in 4 table spoons of warm water  swish and swallow 30 minutes before meals. (Patient not taking: Reported on 11/03/2024) 90 tablet 0   No current facility-administered medications for this visit.    OBJECTIVE: Vitals:   11/03/24 0933  BP: 99/70  Pulse: 75  Resp: 18  Temp: 97.8 F (36.6 C)  SpO2: 95%     Body mass index is 34.58 kg/m.    ECOG FS:0 - Asymptomatic  General: Well-developed, well-nourished, no acute distress. Eyes: Pink conjunctiva, anicteric sclera. HEENT: Normocephalic, moist mucous membranes. Lungs: No audible wheezing or coughing. Heart: Regular rate and rhythm. Abdomen: Soft, nontender, no obvious distention. Musculoskeletal: No edema, cyanosis, or clubbing. Neuro: Alert, answering all questions appropriately. Cranial nerves grossly intact. Skin: No rashes or petechiae noted. Psych: Normal affect.  LAB RESULTS:  Lab Results  Component Value Date   NA 133 (L) 09/19/2024   K 4.8 09/19/2024   CL 102 09/19/2024   CO2 23 09/19/2024   GLUCOSE 96 09/19/2024   BUN 14 09/19/2024   CREATININE 1.02 09/19/2024   CALCIUM  8.9 09/19/2024   PROT 6.5 09/19/2024   ALBUMIN 3.9 09/19/2024   AST 26 09/19/2024   ALT 33 09/19/2024   ALKPHOS 64 09/19/2024   BILITOT 0.6 09/19/2024   GFRNONAA >60 09/19/2024   GFRAA >60 09/05/2020    Lab Results  Component Value Date   WBC 4.7 09/19/2024   NEUTROABS 3.7 09/19/2024   HGB 13.6 09/19/2024   HCT 40.7 09/19/2024   MCV 82.6 09/19/2024   PLT 114 (L) 09/19/2024     STUDIES: No results found.   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  PLAN:    Poorly differentiated adenocarcinoma of the lower third  esophagus, HER2 negative: Confirmed by EUS biopsy at [facility] on June 21, 2024.  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  Unclear if this is recurrent disease  or a second primary.  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  Despite recommendation, patient declined surgery or any other treatments at that time.  He had been monitored at [facility] with frequent endoscopies without evidence of recurrent or progression of disease.  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  There is also central calcification reported.  This was not present at his most recent imaging 7 years ago in 2018.  PET scan results from May 27, 2024 confirmed hypermetabolism of lesion, but no other evidence of disease.  Biopsy confirmed malignancy.  After a lengthy discussion with the patient and his daughters, he wishes to proceed with treatment this time around and the recommended weekly carboplatin  and Taxol  along with concurrent XRT followed by possible surgical resection and a referral has been sent to [facility].  Proceed with his sixth and final treatment of weekly carboplatin  and Taxol  today.  Continue daily XRT, completing treatment on September 22, 2024.  Follow-up will be based on [facility] surgical oncology appointment and recommendations.    Headaches: Patient does not complain of this today.  He was previously instructed to increase his fluid intake and take Tylenol  intermittently with ibuprofen . Dysphagia: Improved.  Continue Carafate  and Magic mouthwash as needed. Hyponatremia: Chronic and unchanged.  Patient's sodium level is 133. Thrombocytopenia: Platelets have trended down slightly to 114, monitor.   Patient expressed understanding and was in agreement with this plan. He also  understands that He can call clinic at any time with any questions, concerns, or complaints.    Cancer Staging  Esophageal cancer [facility]) Staging form: Esophagus - Adenocarcinoma, AJCC 8th Edition - Clinical stage from 06/27/2024: Stage Unknown (cTX, cN0, cM0, G3) - Signed by Jacobo Evalene PARAS, MD on 06/27/2024 Stage prefix: Initial diagnosis Total positive nodes: 0 Histologic grading system: 3 grade system   Evalene PARAS Jacobo, MD   11/03/2024 9:42 AM

## 2024-11-04 ENCOUNTER — Encounter: Payer: Self-pay | Admitting: Oncology

## 2024-11-04 MED ORDER — LIDOCAINE-PRILOCAINE 2.5-2.5 % EX CREA: TOPICAL_CREAM | CUTANEOUS | 3 refills | Status: AC

## 2024-11-04 NOTE — Progress Notes (Signed)
 DISCONTINUE ON PATHWAY REGIMEN - Gastroesophageal     A cycle is every 7 days, concurrent with RT:     Paclitaxel       Carboplatin    **Always confirm dose/schedule in your pharmacy ordering system**  PRIOR TREATMENT: [id number]: Carboplatin  + Paclitaxel  (2/50) Weekly (x 5-6 Weeks) with Concurrent RT  START ON PATHWAY REGIMEN - Gastroesophageal     Cycles 1 through 4: A cycle is every 28 days:     Durvalumab      Docetaxel      Oxaliplatin      Leucovorin      Fluorouracil    Cycles 5 through 14: A cycle is every 28 days:     Durvalumab   **Always confirm dose/schedule in your pharmacy ordering system**  Patient Characteristics: Esophageal & GE Junction, Adenocarcinoma, Preoperative or Nonsurgical Candidate, M0 (Clinical Staging), cT2 or Higher or cN+, Surgical Candidate (Up to cT4a), GE Junction, MSS/pMMR or MSI Unknown Therapeutic Status: Preoperative or Nonsurgical Candidate, M0 (Clinical Staging) Histology: Adenocarcinoma Disease Classification: GE Junction AJCC Grade: G3 AJCC 8 Stage Grouping: Unknown AJCC T Category: cTX AJCC N Category: cN0 AJCC M Category: cM0 Microsatellite/Mismatch Repair Status: Unknown Intent of Therapy: Curative Intent, Discussed with Patient

## 2024-11-06 ENCOUNTER — Other Ambulatory Visit: Payer: Self-pay

## 2024-11-07 ENCOUNTER — Telehealth: Payer: Self-pay | Admitting: *Deleted

## 2024-11-07 NOTE — Telephone Encounter (Signed)
 RN called and spoke with patients daughter regarding appointment for port placement. Patient scheduled for port placement on Tuesday 12/2. Patient to arrive at Heart and Vascular entrance at 8:00 am for 9:00 am appointment. Patient will be NPO after midnight and will need a driver to and from procedure due to sedation. Patients daughter verbalized understanding of all appointment details.

## 2024-11-10 ENCOUNTER — Other Ambulatory Visit: Payer: Self-pay

## 2024-11-10 ENCOUNTER — Encounter: Payer: Self-pay | Admitting: Internal Medicine

## 2024-11-11 ENCOUNTER — Other Ambulatory Visit: Payer: Self-pay

## 2024-11-14 ENCOUNTER — Telehealth: Payer: Self-pay

## 2024-11-14 NOTE — Telephone Encounter (Signed)
 Port a cath placement scheduled for 11/23/2024  Arrive at 1000  for 1100 appointment Come into the Heart and Vascular entrance at [facility]. This entrance is located in the front of the hospital Do not eat or drink anything after midnight Take your regularly scheduled blood pressure, pain, or seizure medication You do not need to hold you blood thinners You will need a driver for this procedure.  Called and reviewed date change and instructions with daughter, Sonny.

## 2024-11-15 NOTE — Progress Notes (Signed)
 Pharmacist Chemotherapy Monitoring - Initial Assessment    Anticipated start date: 11/28/24   The following has been reviewed per standard work regarding the patient's treatment regimen: The patient's diagnosis, treatment plan and drug doses, and organ/hematologic function Lab orders and baseline tests specific to treatment regimen  The treatment plan start date, drug sequencing, and pre-medications Prior authorization status  Patient's documented medication list, including drug-drug interaction screen and prescriptions for anti-emetics and supportive care specific to the treatment regimen The drug concentrations, fluid compatibility, administration routes, and timing of the medications to be used The patient's access for treatment and lifetime cumulative dose history, if applicable  The patient's medication allergies and previous infusion related reactions, if applicable   Changes made to treatment plan:  N/A  Follow up needed:  Patient with injection d3 and pump disconnect d2, follow up with MD if OK to put injection on same day as pump disconnect   Maudie FORBES Andreas, RPH, 11/15/2024  10:30 AM

## 2024-11-21 ENCOUNTER — Encounter: Payer: Self-pay | Admitting: Oncology

## 2024-11-21 ENCOUNTER — Other Ambulatory Visit: Payer: Self-pay | Admitting: Oncology

## 2024-11-21 ENCOUNTER — Ambulatory Visit: Admit: 2024-11-21 | Payer: Medicaid (Managed Care)

## 2024-11-21 DIAGNOSIS — C155 Malignant neoplasm of lower third of esophagus: Secondary | ICD-10-CM

## 2024-11-22 ENCOUNTER — Encounter: Payer: Self-pay | Admitting: Oncology

## 2024-11-22 ENCOUNTER — Other Ambulatory Visit: Payer: Self-pay | Admitting: Radiology

## 2024-11-22 NOTE — Progress Notes (Signed)
 Patient for IR Port Insertion on Wed 11/23/24, I called and spoke with the patient's sister,Sandie on the phone and gave pre-procedure instructions. Sandie was made aware to have the patient here at 10a, NPO after MN prior to procedure as well as driver post procedure/recovery/discharge. Sandie stated understanding. Called 11/22/24

## 2024-11-23 ENCOUNTER — Other Ambulatory Visit: Payer: Self-pay

## 2024-11-23 ENCOUNTER — Encounter: Payer: Self-pay | Admitting: Radiology

## 2024-11-23 ENCOUNTER — Ambulatory Visit: Payer: MEDICAID | Admitting: Radiation Oncology

## 2024-11-23 ENCOUNTER — Ambulatory Visit
Admission: RE | Admit: 2024-11-23 | Discharge: 2024-11-23 | Disposition: A | Payer: MEDICAID | Source: Ambulatory Visit | Attending: Oncology | Admitting: Oncology

## 2024-11-23 DIAGNOSIS — C155 Malignant neoplasm of lower third of esophagus: Secondary | ICD-10-CM

## 2024-11-23 HISTORY — PX: IR IMAGING GUIDED PORT INSERTION: IMG5740

## 2024-11-23 LAB — GLUCOSE, CAPILLARY: Glucose-Capillary: 115 mg/dL — ABNORMAL HIGH (ref 70–99)

## 2024-11-23 MED ORDER — FENTANYL CITRATE (PF) 100 MCG/2ML IJ SOLN
INTRAMUSCULAR | Status: AC
  Filled 2024-11-23: qty 2

## 2024-11-23 MED ORDER — MIDAZOLAM HCL 2 MG/2ML IJ SOLN
INTRAMUSCULAR | Status: AC
  Filled 2024-11-23: qty 2

## 2024-11-23 MED ORDER — FENTANYL CITRATE (PF) 100 MCG/2ML IJ SOLN
INTRAMUSCULAR | Status: AC | PRN
  Administered 2024-11-23 (×2): 50 ug via INTRAVENOUS

## 2024-11-23 MED ORDER — LIDOCAINE-EPINEPHRINE 1 %-1:100000 IJ SOLN
INTRAMUSCULAR | Status: AC
  Filled 2024-11-23: qty 1

## 2024-11-23 MED ORDER — HEPARIN SOD (PORK) LOCK FLUSH 100 UNIT/ML IV SOLN
INTRAVENOUS | Status: AC
  Filled 2024-11-23: qty 5

## 2024-11-23 MED ORDER — LIDOCAINE-EPINEPHRINE 1 %-1:100000 IJ SOLN
20.0000 mL | Freq: Once | INTRAMUSCULAR | Status: AC
  Administered 2024-11-23: 20 mL via INTRADERMAL

## 2024-11-23 MED ORDER — MIDAZOLAM HCL 5 MG/5ML IJ SOLN
INTRAMUSCULAR | Status: AC | PRN
  Administered 2024-11-23 (×2): 1 mg via INTRAVENOUS

## 2024-11-23 MED ORDER — SODIUM CHLORIDE 0.9 % IV SOLN: INTRAVENOUS | Status: DC

## 2024-11-23 MED ORDER — HEPARIN SOD (PORK) LOCK FLUSH 100 UNIT/ML IV SOLN
500.0000 [IU] | Freq: Once | INTRAVENOUS | Status: AC
  Administered 2024-11-23: 500 [IU] via INTRAVENOUS

## 2024-11-23 NOTE — H&P (Signed)
 Chief Complaint: Patient was seen in consultation today for esophageal cancer  Referring Physician(s): Finnegan,Timothy J  Supervising Physician: Hughes Simmonds  Patient Status: [facility] - Out-pt  History of Present Illness: Ian [name] is a 63 y.o. male with a medical history significant for anxiety/depression, bipolar disorder, COPD, CAD, DM, HTN, chronic hepatitis and esophageal cancer initially diagnosed in 2013. He was treated with endoscopic mucosal resection. He has been monitored with surveillance endoscopies since then however a CT scan June 2025 showed a GE junction mass. A PET scan confirmed hypermetabolism of the lesion and a biopsy was positive for malignancy. The patient's oncology team is preparing him for chemoradiation. He is scheduled for his first chemotherapy treatment on 11/28/24.    Interventional Radiology has been asked to evaluate this patient for an image-guided port-a-catheter placement to facilitate his treatment plans.   Past Medical History:  Diagnosis Date   Anxiety    Barrett esophagus    Bipolar disorder (HCC)    Cancer (HCC)    Chronic hepatitis C (HCC)    COPD (chronic obstructive pulmonary disease) (HCC)    Coronary artery disease    Depression    Diabetes mellitus without complication (HCC)    Dyslipidemia    GERD (gastroesophageal reflux disease)    Hepatitis    Hypertension    Leukocytosis    Pre-diabetes    Primary malignant neoplasm (HCC)    Stroke (HCC) 2014   mini-stroke per patient    Past Surgical History:  Procedure Laterality Date   ANGIOPLASTY     APPENDECTOMY     BIOPSY  12/28/2023   Procedure: BIOPSY;  Surgeon: Onita Elspeth Sharper, DO;  Location: [facility] ENDOSCOPY;  Service: Gastroenterology;;   CARDIAC CATHETERIZATION     CHOLECYSTECTOMY     COLONOSCOPY WITH PROPOFOL  N/A 12/28/2023   Procedure: COLONOSCOPY WITH PROPOFOL ;  Surgeon: Onita Elspeth Sharper, DO;  Location: [facility] ENDOSCOPY;  Service: Gastroenterology;   Laterality: N/A;   CORONARY ANGIOPLASTY     ESOPHAGOGASTRODUODENOSCOPY N/A 12/28/2023   Procedure: ESOPHAGOGASTRODUODENOSCOPY (EGD);  Surgeon: Onita Elspeth Sharper, DO;  Location: [facility] ENDOSCOPY;  Service: Gastroenterology;  Laterality: N/A;   HEMOSTASIS CLIP PLACEMENT  12/28/2023   Procedure: HEMOSTASIS CLIP PLACEMENT;  Surgeon: Onita Elspeth Sharper, DO;  Location: [facility] ENDOSCOPY;  Service: Gastroenterology;;   POLYPECTOMY  12/28/2023   Procedure: POLYPECTOMY;  Surgeon: Onita Elspeth Sharper, DO;  Location: [facility] ENDOSCOPY;  Service: Gastroenterology;;   WRIST SURGERY Right    age 63    Allergies: Fluoxetine, Hydrocodone -acetaminophen , Mirtazapine , Hydrochlorothiazide , Tramadol, and Gabapentin   Medications: Prior to Admission medications   Medication Sig Start Date End Date Taking? Authorizing Provider  albuterol  (VENTOLIN  HFA) 108 (90 Base) MCG/ACT inhaler Inhale 2 puffs into the lungs every 6 (six) hours as needed for wheezing or shortness of breath. 11/03/23   Lang Dover, MD  alum & mag hydroxide-simeth (MAALOX PLUS) 400-400-40 MG/5ML suspension Take 10 mLs by mouth every 6 (six) hours as needed for indigestion. 05/24/24   Clarine Sharper LABOR, MD  amLODipine  (NORVASC ) 10 MG tablet Take 10 mg by mouth daily.    [provider]  aspirin  81 MG chewable tablet Chew 1 tablet (81 mg total) by mouth daily. 09/21/20   [name], Norleen DASEN, MD  atorvastatin  (LIPITOR) 40 MG tablet Take 1 tablet (40 mg total) by mouth daily. 01/15/21   Oneita Duwaine HERO, MD  busPIRone  (BUSPAR ) 10 MG tablet Take 2 tablets (20 mg total) by mouth 3 (three) times daily. 06/18/21   Oneita,  Duwaine HERO, MD  clonazePAM  (KLONOPIN ) 0.5 MG tablet Take 1 tablet (0.5 mg total) by mouth 2 (two) times daily as needed (Anxiety). 06/18/21   Oneita Duwaine HERO, MD  cyclobenzaprine  (FLEXERIL ) 10 MG tablet Take 1 tablet (10 mg total) by mouth 2 (two) times daily as needed for muscle spasms. 02/22/22   Angelena Smalls, MD  escitalopram  (LEXAPRO )  20 MG tablet Take 1 tablet (20 mg total) by mouth daily. 06/18/21   Oneita Duwaine HERO, MD  famotidine  (PEPCID ) 20 MG tablet Take 20 mg by mouth 2 (two) times daily.    [provider]  fluticasone-salmeterol (ADVAIR ) 500-50 MCG/ACT AEPB Inhale 1 puff into the lungs in the morning and at bedtime.    [provider]  hydrOXYzine  (ATARAX /VISTARIL ) 25 MG tablet Take 1 tablet (25 mg total) by mouth 3 (three) times daily as needed for anxiety. 06/18/21   Oneita Duwaine HERO, MD  ibuprofen  (ADVIL ) 600 MG tablet Take 600 mg by mouth 3 (three) times daily.    [provider]  lidocaine -prilocaine  (EMLA ) cream Apply to affected area once 11/04/24   Finnegan, Timothy J, MD  lisinopril  (ZESTRIL ) 20 MG tablet Take 1 tablet (20 mg total) by mouth daily. 06/19/21   Oneita Duwaine HERO, MD  magic mouthwash (multi-ingredient) oral suspension Swish and swallow 5-10 mLs 4 (four) times daily as needed. Patient not taking: Reported on 11/03/2024 09/05/24   Jacobo Evalene PARAS, MD  magnesium  oxide (MAG-OX) 400 (240 Mg) MG tablet Take 400 mg by mouth 2 (two) times daily. TAKE 4 TABLETS BY MOUTH 2 TIMES DAILY    [provider]  metFORMIN  (GLUCOPHAGE ) 1000 MG tablet Take 1 tablet (1,000 mg total) by mouth 2 (two) times daily with a meal. 06/20/21   Oneita Duwaine HERO, MD  mometasone -formoterol  (DULERA ) 200-5 MCG/ACT AERO Inhale 2 puffs into the lungs 2 (two) times daily. 09/21/20   [name], Norleen DASEN, MD  pantoprazole  (PROTONIX ) 40 MG tablet Take 1 tablet (40 mg total) by mouth daily. 06/19/21   Freeman, Megan M, MD  pramipexole (MIRAPEX) 0.5 MG tablet Take 0.5 mg by mouth at bedtime. Patient not taking: Reported on 11/03/2024    [provider]  predniSONE  (DELTASONE ) 10 MG tablet Take 1 tablet (10 mg total) by mouth daily. Day 1-2: Take 50 mg  ( 5 pills) Day 3-4 : Take 40 mg (4pills) Day 5-6: Take 30 mg (3 pills) Day 7-8:  Take 20 mg (2 pills) Day 9:  Take 10mg  (1 pill) Patient not taking:  Reported on 11/03/2024 11/03/23   Lang Dover, MD  QUEtiapine  (SEROQUEL  XR) 400 MG 24 hr tablet Take 1 tablet (400 mg total) by mouth at bedtime. 06/18/21   Freeman, Megan M, MD  rOPINIRole (REQUIP XL) 2 MG 24 hr tablet Take by mouth. 09/24/22 11/03/24  [provider]  sucralfate  (CARAFATE ) 1 g tablet Take 1 tablet (1 g total) by mouth 3 (three) times daily before meals. Dissolve tablet in 4 table spoons of warm water  swish and swallow 30 minutes before meals. Patient not taking: Reported on 11/03/2024 08/23/24 09/22/24  Chrystal, Glenn, MD  tiotropium (SPIRIVA) 18 MCG inhalation capsule Place 18 mcg into inhaler and inhale daily.    [provider]  zolpidem  (AMBIEN ) 10 MG tablet Take 1 tablet (10 mg total) by mouth at bedtime as needed for sleep. 06/18/21   Oneita Duwaine HERO, MD     Family History  Problem Relation Age of Onset   Stroke Father  Leukemia Father    Breast cancer Sister     Social History   Socioeconomic History   Marital status: Divorced    Spouse name: Not on file   Number of children: Not on file   Years of education: Not on file   Highest education level: Not on file  Occupational History   Not on file  Tobacco Use   Smoking status: Every Day    Current packs/day: 1.00    Types: Cigarettes   Smokeless tobacco: Never   Tobacco comments:    1 pack/day  Vaping Use   Vaping status: Never Used  Substance and Sexual Activity   Alcohol use: Not Currently   Drug use: Yes    Types: Marijuana    Comment: daily   Sexual activity: Not Currently  Other Topics Concern   Not on file  Social History Narrative   Live at home alone   Social Drivers of Health   Financial Resource Strain: High Risk (09/21/2024)   Received from [facility]   Overall Financial Resource Strain (CARDIA)    Difficulty of Paying Living Expenses: Hard  Food Insecurity: No Food Insecurity (10/12/2024)   Received from [facility]   Hunger Vital  Sign    Within the past 12 months, you worried that your food would run out before you got the money to buy more.: Never true    Within the past 12 months, the food you bought just didn't last and you didn't have money to get more.: Never true  Recent Concern: Food Insecurity - Food Insecurity Present (09/21/2024)   Received from [facility]   Hunger Vital Sign    Within the past 12 months, you worried that your food would run out before you got the money to buy more.: Often true    Within the past 12 months, the food you bought just didn't last and you didn't have money to get more.: Often true  Transportation Needs: No Transportation Needs (10/12/2024)   Received from [facility] - Transportation    Lack of Transportation (Medical): No    Lack of Transportation (Non-Medical): No  Physical Activity: Not on file  Stress: Not on file  Social Connections: Not on file    Review of Systems: A 12 point ROS discussed and pertinent positives are indicated in the HPI above.  All other systems are negative.  Review of Systems  Gastrointestinal:  Positive for diarrhea.  All other systems reviewed and are negative.   Vital Signs: BP 94/67   Pulse 72   Temp 97.7 F (36.5 C) (Temporal)   Resp (!) 22   Wt 251 lb (113.9 kg) Comment: per home scale  SpO2 95%   BMI 34.04 kg/m   Physical Exam Constitutional:      General: He is not in acute distress.    Appearance: He is not ill-appearing.  HENT:     Mouth/Throat:     Mouth: Mucous membranes are moist.     Pharynx: Oropharynx is clear.  Cardiovascular:     Rate and Rhythm: Normal rate.  Pulmonary:     Effort: Pulmonary effort is normal.  Abdominal:     Tenderness: There is no abdominal tenderness.  Skin:    General: Skin is warm and dry.  Neurological:     Mental Status: He is alert and oriented to person, place, and time.  Psychiatric:        Mood  and Affect: Mood normal.        Behavior: Behavior  normal.        Thought Content: Thought content normal.        Judgment: Judgment normal.     Imaging: No results found.  Labs:  CBC: Recent Labs    08/29/24 0924 09/05/24 0907 09/12/24 0901 09/19/24 0913  WBC 5.5 5.8 6.0 4.7  HGB 13.5 13.5 14.3 13.6  HCT 41.8 42.1 43.3 40.7  PLT 169 190 141* 114*    COAGS: No results for input(s): INR, APTT in the last 8760 hours.  BMP: Recent Labs    08/29/24 0924 09/05/24 0907 09/12/24 0901 09/19/24 0913  NA 135 135 132* 133*  K 4.5 4.1 4.2 4.8  CL 102 103 102 102  CO2 25 23 22 23   GLUCOSE 94 105* 91 96  BUN 14 9 22 14   CALCIUM  8.6* 8.5* 8.9 8.9  CREATININE 0.93 0.84 1.10 1.02  GFRNONAA >60 >60 >60 >60    LIVER FUNCTION TESTS: Recent Labs    08/29/24 0924 09/05/24 0907 09/12/24 0901 09/19/24 0913  BILITOT 0.5 0.3 0.9 0.6  AST 13* 19 19 26   ALT 12 16 20  33  ALKPHOS 61 61 61 64  PROT 6.4* 6.3* 6.6 6.5  ALBUMIN 3.8 3.8 4.0 3.9    TUMOR MARKERS: No results for input(s): AFPTM, CEA, CA199, CHROMGRNA in the last 8760 hours.  Assessment and Plan:  Esophageal Cancer: Ian [name], 63 year old male, presents today to the [facility] Interventional Radiology department for an image-guided port-a-catheter placement.   Risks and benefits of image-guided Port-a-catheter placement were discussed with the patient including, but not limited to bleeding, infection, pneumothorax, or fibrin sheath development and need for additional procedures.  All of the patient's questions were answered, patient is agreeable to proceed. He has been NPO.   Consent signed and in chart.  Thank you for this interesting consult.  I greatly enjoyed meeting Ian [name] and look forward to participating in their care.  A copy of this report was sent to the requesting provider on this date.  Electronically Signed: Warren Dais, AGACNP-BC 11/23/2024, 10:52 AM   I spent a total of  30 Minutes   in  face to face in clinical consultation, greater than 50% of which was counseling/coordinating care for esophageal cancer.

## 2024-11-23 NOTE — Progress Notes (Signed)
 Patient clinically stable post IR Port placement per DR Mugweru, tolerated well. Vitals stable pre and post procedure. Received Versed 2 mg along with Fentanyl 100 mcg IV for procedure. Denies complaints post procedure. Report given to Josette Barba RN post procedure/specials/19

## 2024-11-23 NOTE — Procedures (Signed)
 Vascular and Interventional Radiology Procedure Note  Patient: Ian [name] DOB: June 21, 1961 Medical Record Number: [id number] Note Date/Time: 11/23/24 11:17 AM   Performing Physician: Thom Hall, MD Assistant(s): None  Diagnosis: Esophageal CA  Procedure: PORT PLACEMENT  Anesthesia: Conscious Sedation Complications: None Estimated Blood Loss: Minimal  Findings:  Successful right-sided port placement, with the tip of the catheter in the proximal right atrium.  Plan: Catheter ready for use.  See detailed procedure note with images in PACS. The patient tolerated the procedure well without incident or complication and was returned to Recovery in stable condition.    Thom Hall, MD [facility] Radiology   Pager. [phone] Clinic. [phone]

## 2024-11-24 ENCOUNTER — Other Ambulatory Visit: Payer: Self-pay

## 2024-11-28 ENCOUNTER — Encounter: Payer: Self-pay | Admitting: Oncology

## 2024-11-28 ENCOUNTER — Inpatient Hospital Stay: Payer: MEDICAID | Attending: Oncology | Admitting: Oncology

## 2024-11-28 ENCOUNTER — Inpatient Hospital Stay: Payer: MEDICAID | Attending: Oncology

## 2024-11-28 ENCOUNTER — Inpatient Hospital Stay: Payer: MEDICAID

## 2024-11-28 VITALS — BP 109/77 | HR 86 | Temp 97.7°F | Resp 16 | Ht 73.0 in | Wt 255.0 lb

## 2024-11-28 VITALS — BP 105/73 | HR 74

## 2024-11-28 DIAGNOSIS — Z5189 Encounter for other specified aftercare: Secondary | ICD-10-CM | POA: Insufficient documentation

## 2024-11-28 DIAGNOSIS — C155 Malignant neoplasm of lower third of esophagus: Secondary | ICD-10-CM | POA: Insufficient documentation

## 2024-11-28 DIAGNOSIS — Z5112 Encounter for antineoplastic immunotherapy: Secondary | ICD-10-CM | POA: Diagnosis present

## 2024-11-28 DIAGNOSIS — Z5111 Encounter for antineoplastic chemotherapy: Secondary | ICD-10-CM | POA: Insufficient documentation

## 2024-11-28 DIAGNOSIS — F17210 Nicotine dependence, cigarettes, uncomplicated: Secondary | ICD-10-CM | POA: Diagnosis not present

## 2024-11-28 LAB — CMP (CANCER CENTER ONLY)
ALT: 14 U/L (ref 0–44)
AST: 17 U/L (ref 15–41)
Albumin: 4.2 g/dL (ref 3.5–5.0)
Alkaline Phosphatase: 98 U/L (ref 38–126)
Anion gap: 11 (ref 5–15)
BUN: 13 mg/dL (ref 8–23)
CO2: 24 mmol/L (ref 22–32)
Calcium: 9.5 mg/dL (ref 8.9–10.3)
Chloride: 104 mmol/L (ref 98–111)
Creatinine: 0.93 mg/dL (ref 0.61–1.24)
GFR, Estimated: >60 mL/min (ref >60)
Glucose, Bld: 105 mg/dL — ABNORMAL HIGH (ref 70–99)
Potassium: 4.4 mmol/L (ref 3.5–5.1)
Sodium: 140 mmol/L (ref 135–145)
Total Bilirubin: 0.3 mg/dL (ref 0.0–1.2)
Total Protein: 6.6 g/dL (ref 6.5–8.1)

## 2024-11-28 LAB — CBC WITH DIFFERENTIAL (CANCER CENTER ONLY)
Abs Immature Granulocytes: 0.03 K/uL (ref 0.00–0.07)
Basophils Absolute: 0 K/uL (ref 0.0–0.1)
Basophils Relative: 0 %
Eosinophils Absolute: 0.4 K/uL (ref 0.0–0.5)
Eosinophils Relative: 4 %
HCT: 39.4 % (ref 39.0–52.0)
Hemoglobin: 13.5 g/dL (ref 13.0–17.0)
Immature Granulocytes: 0 %
Lymphocytes Relative: 11 %
Lymphs Abs: 1 K/uL (ref 0.7–4.0)
MCH: 30.5 pg (ref 26.0–34.0)
MCHC: 34.3 g/dL (ref 30.0–36.0)
MCV: 88.9 fL (ref 80.0–100.0)
Monocytes Absolute: 0.6 K/uL (ref 0.1–1.0)
Monocytes Relative: 6 %
Neutro Abs: 7.3 K/uL (ref 1.7–7.7)
Neutrophils Relative %: 79 %
Platelet Count: 206 K/uL (ref 150–400)
RBC: 4.43 MIL/uL (ref 4.22–5.81)
RDW: 15.7 % — ABNORMAL HIGH (ref 11.5–15.5)
WBC Count: 9.4 K/uL (ref 4.0–10.5)
nRBC: 0 % (ref 0.0–0.2)

## 2024-11-28 LAB — MAGNESIUM: Magnesium: 1.6 mg/dL — ABNORMAL LOW (ref 1.7–2.4)

## 2024-11-28 LAB — TSH: TSH: 0.92 u[IU]/mL (ref 0.350–4.500)

## 2024-11-28 MED ORDER — LEUCOVORIN CALCIUM INJECTION 350 MG
200.0000 mg/m2 | Freq: Once | INTRAVENOUS | Status: AC
  Administered 2024-11-28: 484 mg via INTRAVENOUS
  Filled 2024-11-28: qty 24.2

## 2024-11-28 MED ORDER — DEXTROSE 5 % IV SOLN
INTRAVENOUS | Status: DC
  Filled 2024-11-28: qty 250

## 2024-11-28 MED ORDER — SODIUM CHLORIDE 0.9 % IV SOLN
50.0000 mg/m2 | Freq: Once | INTRAVENOUS | Status: AC
  Administered 2024-11-28: 121 mg via INTRAVENOUS
  Filled 2024-11-28: qty 12.1

## 2024-11-28 MED ORDER — PALONOSETRON HCL INJECTION 0.25 MG/5ML
0.2500 mg | Freq: Once | INTRAVENOUS | Status: AC
  Administered 2024-11-28: 0.25 mg via INTRAVENOUS
  Filled 2024-11-28: qty 5

## 2024-11-28 MED ORDER — DEXAMETHASONE SOD PHOSPHATE PF 10 MG/ML IJ SOLN
10.0000 mg | Freq: Once | INTRAMUSCULAR | Status: AC
  Administered 2024-11-28: 10 mg via INTRAVENOUS

## 2024-11-28 MED ORDER — LORAZEPAM 2 MG/ML IJ SOLN
1.0000 mg | Freq: Once | INTRAMUSCULAR | Status: AC
  Administered 2024-11-28: 1 mg via INTRAVENOUS
  Filled 2024-11-28: qty 1

## 2024-11-28 MED ORDER — SODIUM CHLORIDE 0.9 % IV SOLN
INTRAVENOUS | Status: DC
  Filled 2024-11-28: qty 250

## 2024-11-28 MED ORDER — SODIUM CHLORIDE 0.9 % IV SOLN
2600.0000 mg/m2 | INTRAVENOUS | Status: DC
  Administered 2024-11-28: 6300 mg via INTRAVENOUS
  Filled 2024-11-28: qty 126

## 2024-11-28 MED ORDER — OXALIPLATIN CHEMO INJECTION 100 MG/20ML
85.0000 mg/m2 | Freq: Once | INTRAVENOUS | Status: AC
  Administered 2024-11-28: 200 mg via INTRAVENOUS
  Filled 2024-11-28: qty 40

## 2024-11-28 MED ORDER — LORAZEPAM 1 MG PO TABS: 1.0000 mg | ORAL_TABLET | Freq: Once | ORAL | Status: DC

## 2024-11-28 MED ORDER — SODIUM CHLORIDE 0.9 % IV SOLN
1500.0000 mg | Freq: Once | INTRAVENOUS | Status: AC
  Administered 2024-11-28: 1500 mg via INTRAVENOUS
  Filled 2024-11-28: qty 30

## 2024-11-28 NOTE — Progress Notes (Signed)
 Patient

## 2024-11-28 NOTE — Progress Notes (Signed)
 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  INTERVAL HISTORY: Patient returns to clinic today for further evaluation and initiation of cycle 1 of FLOT + durvalumab .  He continues to feel well and remains asymptomatic.  He has no neurologic complaints.  He does not complain of dysphagia today.  He does not complain of pain today.  He denies any recent fevers or illnesses.  He has a good appetite and denies weight loss.  He has no chest pain, shortness of breath, cough, or hemoptysis.  He denies any nausea, vomiting, constipation, or diarrhea.  He has no urinary complaints.  Patient offers no further specific complaints today.  REVIEW OF SYSTEMS:   Review of Systems  Constitutional: Negative.  Negative for fever, malaise/fatigue and weight loss.  HENT:  Negative for sore throat.   Respiratory: Negative.  Negative for cough, hemoptysis and shortness of breath.   Cardiovascular: Negative.  Negative for chest pain and leg swelling.  Gastrointestinal: Negative.  Negative for abdominal pain.  Genitourinary: Negative.  Negative for dysuria.  Musculoskeletal: Negative.  Negative for back pain.  Skin: Negative.  Negative for rash.  Neurological: Negative.  Negative for dizziness, focal weakness, weakness and headaches.  Psychiatric/Behavioral: Negative.  The patient is not nervous/anxious.     As per HPI. Otherwise, a complete review of systems is negative.  PAST MEDICAL HISTORY: Past Medical History:  Diagnosis Date   Anxiety    Barrett esophagus    Bipolar disorder (HCC)    Cancer (HCC)     Chronic hepatitis C (HCC)    COPD (chronic obstructive pulmonary disease) (HCC)    Coronary artery disease    Depression    Diabetes mellitus without complication (HCC)    Dyslipidemia    GERD (gastroesophageal reflux disease)    Hepatitis    Hypertension    Leukocytosis    Pre-diabetes    Primary malignant neoplasm (HCC)    Stroke (HCC) 2014   mini-stroke per patient    PAST SURGICAL HISTORY: Past Surgical History:  Procedure Laterality Date   ANGIOPLASTY     APPENDECTOMY     BIOPSY  12/28/2023   Procedure: BIOPSY;  Surgeon: Onita Elspeth Sharper, DO;  Location: [facility] ENDOSCOPY;  Service: Gastroenterology;;   CARDIAC CATHETERIZATION     CHOLECYSTECTOMY     COLONOSCOPY WITH PROPOFOL  N/A 12/28/2023   Procedure: COLONOSCOPY WITH PROPOFOL ;  Surgeon: Onita Elspeth Sharper, DO;  Location: [facility] ENDOSCOPY;  Service: Gastroenterology;  Laterality: N/A;   CORONARY ANGIOPLASTY     ESOPHAGOGASTRODUODENOSCOPY N/A 12/28/2023   Procedure: ESOPHAGOGASTRODUODENOSCOPY (EGD);  Surgeon: Onita Elspeth Sharper, DO;  Location: [facility] ENDOSCOPY;  Service: Gastroenterology;  Laterality: N/A;   HEMOSTASIS CLIP PLACEMENT  12/28/2023   Procedure: HEMOSTASIS CLIP PLACEMENT;  Surgeon: Onita Elspeth Sharper, DO;  Location: [facility] ENDOSCOPY;  Service: Gastroenterology;;   IR IMAGING GUIDED PORT INSERTION  11/23/2024   POLYPECTOMY  12/28/2023   Procedure: POLYPECTOMY;  Surgeon: Onita Elspeth Sharper, DO;  Location: [facility] ENDOSCOPY;  Service: Gastroenterology;;   WRIST SURGERY Right    age 27    FAMILY HISTORY: Family History  Problem Relation Age of Onset   Stroke Father    Leukemia  Father    Breast cancer Sister     ADVANCED DIRECTIVES (Y/N):  N  HEALTH MAINTENANCE: Social History   Tobacco Use   Smoking status: Every Day    Current packs/day: 1.00    Types: Cigarettes   Smokeless tobacco: Never   Tobacco comments:    1 pack/day  Vaping Use   Vaping status: Never Used  Substance Use Topics   Alcohol  use: Not Currently   Drug use: Yes    Types: Marijuana    Comment: daily     Colonoscopy:  PAP:  Bone density:  Lipid panel:  Allergies  Allergen Reactions   Fluoxetine Swelling    Anxiety, itching   Hydrocodone -Acetaminophen  Itching   Mirtazapine  Other (See Comments)    nightmares   Hydrochlorothiazide  Other (See Comments)    LEG CRAMPING HYPOMAGNESEMIA   Tramadol Other (See Comments)   Gabapentin  Other (See Comments)    Nightmares     Current Outpatient Medications  Medication Sig Dispense Refill   albuterol  (VENTOLIN  HFA) 108 (90 Base) MCG/ACT inhaler Inhale 2 puffs into the lungs every 6 (six) hours as needed for wheezing or shortness of breath. 18 g 1   alum & mag hydroxide-simeth (MAALOX PLUS) 400-400-40 MG/5ML suspension Take 10 mLs by mouth every 6 (six) hours as needed for indigestion. 355 mL 0   amLODipine  (NORVASC ) 10 MG tablet Take 10 mg by mouth daily.     aspirin  81 MG chewable tablet Chew 1 tablet (81 mg total) by mouth daily. 30 tablet 1   atorvastatin  (LIPITOR) 40 MG tablet Take 1 tablet (40 mg total) by mouth daily. 30 tablet 1   busPIRone  (BUSPAR ) 10 MG tablet Take 2 tablets (20 mg total) by mouth 3 (three) times daily. 180 tablet 1   clonazePAM  (KLONOPIN ) 0.5 MG tablet Take 1 tablet (0.5 mg total) by mouth 2 (two) times daily as needed (Anxiety). 60 tablet 1   cyclobenzaprine  (FLEXERIL ) 10 MG tablet Take 1 tablet (10 mg total) by mouth 2 (two) times daily as needed for muscle spasms. 20 tablet 0   escitalopram  (LEXAPRO ) 20 MG tablet Take 1 tablet (20 mg total) by mouth daily. 30 tablet 1   famotidine  (PEPCID ) 20 MG tablet Take 20 mg by mouth 2 (two) times daily.     fluticasone-salmeterol (ADVAIR ) 500-50 MCG/ACT AEPB Inhale 1 puff into the lungs in the morning and at bedtime.     hydrOXYzine  (ATARAX /VISTARIL ) 25 MG tablet Take 1 tablet (25 mg total) by mouth 3 (three) times daily as needed for anxiety. 90 tablet 1   ibuprofen  (ADVIL ) 600 MG tablet Take 600  mg by mouth 3 (three) times daily.     lidocaine -prilocaine  (EMLA ) cream Apply to affected area once 30 g 3   lisinopril  (ZESTRIL ) 20 MG tablet Take 1 tablet (20 mg total) by mouth daily. 30 tablet 1   magnesium  oxide (MAG-OX) 400 (240 Mg) MG tablet Take 400 mg by mouth 2 (two) times daily. TAKE 4 TABLETS BY MOUTH 2 TIMES DAILY     metFORMIN  (GLUCOPHAGE ) 1000 MG tablet Take 1 tablet (1,000 mg total) by mouth 2 (two) times daily with a meal. 60 tablet 1   mometasone -formoterol  (DULERA ) 200-5 MCG/ACT AERO Inhale 2 puffs into the lungs 2 (two) times daily. 1 each 1   pantoprazole  (PROTONIX ) 40 MG tablet Take 1 tablet (40 mg total) by mouth daily. 30 tablet 1   pramipexole (MIRAPEX) 0.5 MG tablet Take 0.5 mg by mouth at bedtime.  QUEtiapine  (SEROQUEL  XR) 400 MG 24 hr tablet Take 1 tablet (400 mg total) by mouth at bedtime. 30 tablet 1   rOPINIRole (REQUIP XL) 2 MG 24 hr tablet Take by mouth.     tiotropium (SPIRIVA) 18 MCG inhalation capsule Place 18 mcg into inhaler and inhale daily.     zolpidem  (AMBIEN ) 10 MG tablet Take 1 tablet (10 mg total) by mouth at bedtime as needed for sleep. 30 tablet 1   magic mouthwash (multi-ingredient) oral suspension Swish and swallow 5-10 mLs 4 (four) times daily as needed. (Patient not taking: Reported on 11/28/2024) 480 mL 3   sucralfate  (CARAFATE ) 1 g tablet Take 1 tablet (1 g total) by mouth 3 (three) times daily before meals. Dissolve tablet in 4 table spoons of warm water  swish and swallow 30 minutes before meals. (Patient not taking: Reported on 11/28/2024) 90 tablet 0   No current facility-administered medications for this visit.    OBJECTIVE: Vitals:   11/28/24 0916  BP: 109/77  Pulse: 86  Resp: 16  Temp: 97.7 F (36.5 C)  SpO2: 96%     Body mass index is 33.64 kg/m.    ECOG FS:0 - Asymptomatic  General: Well-developed, well-nourished, no acute distress. Eyes: Pink conjunctiva, anicteric sclera. HEENT: Normocephalic, moist mucous  membranes. Lungs: No audible wheezing or coughing. Heart: Regular rate and rhythm. Abdomen: Soft, nontender, no obvious distention. Musculoskeletal: No edema, cyanosis, or clubbing. Neuro: Alert, answering all questions appropriately. Cranial nerves grossly intact. Skin: No rashes or petechiae noted. Psych: Normal affect.  LAB RESULTS:  Lab Results  Component Value Date   NA 133 (L) 09/19/2024   K 4.8 09/19/2024   CL 102 09/19/2024   CO2 23 09/19/2024   GLUCOSE 96 09/19/2024   BUN 14 09/19/2024   CREATININE 1.02 09/19/2024   CALCIUM  8.9 09/19/2024   PROT 6.5 09/19/2024   ALBUMIN 3.9 09/19/2024   AST 26 09/19/2024   ALT 33 09/19/2024   ALKPHOS 64 09/19/2024   BILITOT 0.6 09/19/2024   GFRNONAA >60 09/19/2024   GFRAA >60 09/05/2020    Lab Results  Component Value Date   WBC 9.4 11/28/2024   NEUTROABS 7.3 11/28/2024   HGB 13.5 11/28/2024   HCT 39.4 11/28/2024   MCV 88.9 11/28/2024   PLT 206 11/28/2024     STUDIES: IR IMAGING GUIDED PORT INSERTION Result Date: 11/23/2024 INDICATION: chemotherapy.  History of esophageal cancer. EXAM: IMPLANTED PORT A CATH PLACEMENT WITH ULTRASOUND AND FLUOROSCOPIC GUIDANCE MEDICATIONS: None ANESTHESIA/SEDATION: Moderate (conscious) sedation was employed during this procedure. A total of Versed  2 mg and Fentanyl  100 mcg was administered intravenously. Moderate Sedation Time: 19 minutes. The patient's level of consciousness and vital signs were monitored continuously by radiology nursing throughout the procedure under my direct supervision. FLUOROSCOPY: Radiation Exposure Index and estimated peak skin dose (PSD); Reference air kerma (RAK), 0 mGy. COMPLICATIONS: None immediate. PROCEDURE: The procedure, risks, benefits, and alternatives were explained to the patient. Questions regarding the procedure were encouraged and answered. The patient understands and consents to the procedure. The RIGHT neck and chest were prepped with chlorhexidine in a  sterile fashion, and a sterile drape was applied covering the operative field. Maximum barrier sterile technique with sterile gowns and gloves were used for the procedure. A timeout was performed prior to the initiation of the procedure. Local anesthesia was provided with 1% lidocaine  with epinephrine . After creating a small venotomy incision, a micropuncture kit was utilized to access the internal jugular vein under direct, real-time  ultrasound guidance. Ultrasound image documentation was performed. The microwire was kinked to measure appropriate catheter length. A subcutaneous port pocket was then created along the upper chest wall utilizing a combination of sharp and blunt dissection. The pocket was irrigated with sterile saline. A single lumen power injectable port was chosen for placement. The 8 Fr catheter was tunneled from the port pocket site to the venotomy incision. The port was placed in the pocket. The external catheter was trimmed to appropriate length. At the venotomy, an 8 Fr peel-away sheath was placed over a guidewire under fluoroscopic guidance. The catheter was then placed through the sheath and the sheath was removed. Final catheter positioning was confirmed and documented with a fluoroscopic spot radiograph. The port was accessed with a Huber needle, aspirated and flushed with heparinized saline. The port pocket incision was closed with interrupted 3-0 Vicryl suture then Dermabond was applied, including at the venotomy incision. Dressings were placed. The patient tolerated the procedure well without immediate post procedural complication. IMPRESSION: Successful placement of a RIGHT internal jugular approach power injectable Port-A-Cath. The tip of the catheter is positioned within the proximal RIGHT atrium. The catheter is ready for immediate use. Thom Hall, MD [facility] Electronically Signed   By: Thom Hall M.D.   On: 11/23/2024  14:41    ONCOLOGY HISTORY: Confirmed by EUS biopsy at [facility] on June 21, 2024.  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  Unclear if this is recurrent disease or a second primary.  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  Despite recommendation, patient declined surgery or any other treatments at that time.  He had been monitored at [facility] with frequent endoscopies without evidence of recurrent or progression of disease.  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  There is also central calcification reported.  This was not present at his most recent imaging 7 years ago in 2018.  PET scan results from May 27, 2024 confirmed hypermetabolism of lesion, but no other evidence of disease.  Biopsy confirmed malignancy.  After a lengthy discussion with the patient and his daughters, he wishes to proceed with treatment this time around and the recommended weekly carboplatin  and Taxol  along with concurrent XRT.  Patient completed treatment on September 22, 2024.   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  Patient was evaluated by [facility] surgical oncology and recommendation is to pursue additional neoadjuvant chemotherapy with FLOT + durvalumab  with G-CSF support every 2 weeks for an additional 4-6 cycles.  Patient has now had port placement.  Proceed with cycle 1 of treatment today.  Return to clinic tomorrow for pump removal and G-CSF.  He will then return to clinic in 1 week for laboratory work and further evaluation, and then in 2 weeks for further evaluation and consideration of  cycle 2.    Headaches: Patient does not complain of this today.  He was previously instructed to increase his fluid intake and take Tylenol  intermittently with ibuprofen . Dysphagia: Resolved.  Continue Carafate  and Magic mouthwash as needed. Hyponatremia: Resolved. Hypomagnesia: Patient's magnesium  1.6.  Monitor. Thrombocytopenia: Resolved.  Patient expressed understanding and was in agreement with this plan. He also understands that He can call clinic at any time with any questions, concerns, or complaints.    Cancer Staging  Esophageal cancer [facility]) Staging form: Esophagus - Adenocarcinoma, AJCC 8th Edition - Clinical stage from 06/27/2024: Stage Unknown (cTX, cN0, cM0, G3) - Signed by Jacobo Evalene PARAS, MD on 06/27/2024 Stage prefix: Initial diagnosis Total positive nodes: 0 Histologic grading system: 3 grade system   Evalene PARAS Jacobo, MD   11/28/2024 9:31 AM

## 2024-11-29 ENCOUNTER — Inpatient Hospital Stay: Payer: MEDICAID

## 2024-11-29 VITALS — BP 115/61 | HR 81 | Temp 96.5°F | Resp 17

## 2024-11-29 DIAGNOSIS — C155 Malignant neoplasm of lower third of esophagus: Secondary | ICD-10-CM

## 2024-11-29 DIAGNOSIS — Z5112 Encounter for antineoplastic immunotherapy: Secondary | ICD-10-CM | POA: Diagnosis not present

## 2024-11-29 LAB — T4: T4, Total: 5.2 ug/dL (ref 4.5–12.0)

## 2024-11-29 MED ORDER — PEGFILGRASTIM-CBQV 6 MG/0.6ML ~~LOC~~ SOSY
6.0000 mg | PREFILLED_SYRINGE | Freq: Once | SUBCUTANEOUS | Status: AC
  Administered 2024-11-29: 6 mg via SUBCUTANEOUS

## 2024-11-30 ENCOUNTER — Inpatient Hospital Stay: Payer: MEDICAID

## 2024-12-05 ENCOUNTER — Inpatient Hospital Stay: Payer: MEDICAID

## 2024-12-05 ENCOUNTER — Inpatient Hospital Stay: Payer: MEDICAID | Admitting: Oncology

## 2024-12-06 ENCOUNTER — Inpatient Hospital Stay: Payer: MEDICAID

## 2024-12-06 ENCOUNTER — Inpatient Hospital Stay: Payer: MEDICAID | Admitting: Oncology

## 2024-12-06 ENCOUNTER — Encounter: Payer: Self-pay | Admitting: Oncology

## 2024-12-06 VITALS — BP 89/74 | HR 84 | Temp 98.6°F | Resp 19 | Wt 249.5 lb

## 2024-12-06 DIAGNOSIS — C155 Malignant neoplasm of lower third of esophagus: Secondary | ICD-10-CM | POA: Diagnosis not present

## 2024-12-06 DIAGNOSIS — Z5112 Encounter for antineoplastic immunotherapy: Secondary | ICD-10-CM | POA: Diagnosis not present

## 2024-12-06 LAB — CBC WITH DIFFERENTIAL/PLATELET
Abs Immature Granulocytes: 1.84 K/uL — ABNORMAL HIGH (ref 0.00–0.07)
Basophils Absolute: 0.1 K/uL (ref 0.0–0.1)
Basophils Relative: 1 %
Eosinophils Absolute: 0.3 K/uL (ref 0.0–0.5)
Eosinophils Relative: 2 %
HCT: 38.3 % — ABNORMAL LOW (ref 39.0–52.0)
Hemoglobin: 13.1 g/dL (ref 13.0–17.0)
Immature Granulocytes: 11 %
Lymphocytes Relative: 8 %
Lymphs Abs: 1.3 K/uL (ref 0.7–4.0)
MCH: 30.8 pg (ref 26.0–34.0)
MCHC: 34.2 g/dL (ref 30.0–36.0)
MCV: 89.9 fL (ref 80.0–100.0)
Monocytes Absolute: 1.3 K/uL — ABNORMAL HIGH (ref 0.1–1.0)
Monocytes Relative: 8 %
Neutro Abs: 12.2 K/uL — ABNORMAL HIGH (ref 1.7–7.7)
Neutrophils Relative %: 70 %
Platelets: 183 K/uL (ref 150–400)
RBC: 4.26 MIL/uL (ref 4.22–5.81)
RDW: 14.6 % (ref 11.5–15.5)
Smear Review: NORMAL
WBC: 17.1 K/uL — ABNORMAL HIGH (ref 4.0–10.5)
nRBC: 0 % (ref 0.0–0.2)

## 2024-12-06 LAB — CMP (CANCER CENTER ONLY)
ALT: 15 U/L (ref 0–44)
AST: 18 U/L (ref 15–41)
Albumin: 4.3 g/dL (ref 3.5–5.0)
Alkaline Phosphatase: 129 U/L — ABNORMAL HIGH (ref 38–126)
Anion gap: 14 (ref 5–15)
BUN: 12 mg/dL (ref 8–23)
CO2: 23 mmol/L (ref 22–32)
Calcium: 9.5 mg/dL (ref 8.9–10.3)
Chloride: 102 mmol/L (ref 98–111)
Creatinine: 1.12 mg/dL (ref 0.61–1.24)
GFR, Estimated: >60 mL/min (ref >60)
Glucose, Bld: 122 mg/dL — ABNORMAL HIGH (ref 70–99)
Potassium: 4.1 mmol/L (ref 3.5–5.1)
Sodium: 139 mmol/L (ref 135–145)
Total Bilirubin: 0.3 mg/dL (ref 0.0–1.2)
Total Protein: 6.9 g/dL (ref 6.5–8.1)

## 2024-12-06 LAB — MAGNESIUM: Magnesium: 1.2 mg/dL — ABNORMAL LOW (ref 1.7–2.4)

## 2024-12-06 MED ORDER — MAGNESIUM SULFATE 4 GM/100ML IV SOLN
4.0000 g | Freq: Once | INTRAVENOUS | Status: AC
  Administered 2024-12-06: 12:00:00 4 g via INTRAVENOUS
  Filled 2024-12-06: qty 100

## 2024-12-06 MED ORDER — SODIUM CHLORIDE 0.9 % IV SOLN
Freq: Once | INTRAVENOUS | Status: AC
  Filled 2024-12-06: qty 250

## 2024-12-06 NOTE — Progress Notes (Unsigned)
 Patient states he is starting to have a intolerance to cold. Patient states when he does to get a can out of the fridge it feels like a shock to his hands,

## 2024-12-06 NOTE — Progress Notes (Unsigned)
 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  INTERVAL HISTORY: Patient returns to clinic today for further evaluation and to assess his toleration of FLOT + durvalumab .  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  He also had nausea for approximately 1 to 2 days after his pump removal, but otherwise tolerated his treatment well.  He has no neurologic complaints.  He does not complain of dysphagia today.  He does not complain of pain today.  He denies any recent fevers or illnesses.  He has a good appetite and denies weight loss.  He has no chest pain, shortness of breath, cough, or hemoptysis.  He denies any nausea, vomiting, constipation, or diarrhea.  He has no urinary complaints.  Patient offers no further specific complaints today.  REVIEW OF SYSTEMS:   Review of Systems  Constitutional: Negative.  Negative for fever, malaise/fatigue and weight loss.  HENT:  Negative for sore throat.   Respiratory: Negative.  Negative for cough, hemoptysis and shortness of breath.   Cardiovascular: Negative.  Negative for chest pain and leg swelling.  Gastrointestinal: Negative.  Negative for abdominal pain.  Genitourinary: Negative.  Negative for dysuria.  Musculoskeletal: Negative.  Negative for back pain.  Skin: Negative.  Negative for rash.  Neurological: Negative.  Negative for dizziness, focal weakness, weakness and headaches.  Psychiatric/Behavioral: Negative.  The patient is not nervous/anxious.     As per HPI. Otherwise, a complete review of systems is negative.  PAST MEDICAL HISTORY: Past  Medical History:  Diagnosis Date   Anxiety    Barrett esophagus    Bipolar disorder (HCC)    Cancer (HCC)    Chronic hepatitis C (HCC)    COPD (chronic obstructive pulmonary disease) (HCC)    Coronary artery disease    Depression    Diabetes mellitus without complication (HCC)    Dyslipidemia    GERD (gastroesophageal reflux disease)    Hepatitis    Hypertension    Leukocytosis    Pre-diabetes    Primary malignant neoplasm (HCC)    Stroke (HCC) 2014   mini-stroke per patient    PAST SURGICAL HISTORY: Past Surgical History:  Procedure Laterality Date   ANGIOPLASTY     APPENDECTOMY     BIOPSY  12/28/2023   Procedure: BIOPSY;  Surgeon: Onita Elspeth Sharper, DO;  Location: [facility] ENDOSCOPY;  Service: Gastroenterology;;   CARDIAC CATHETERIZATION     CHOLECYSTECTOMY     COLONOSCOPY WITH PROPOFOL  N/A 12/28/2023   Procedure: COLONOSCOPY WITH PROPOFOL ;  Surgeon: Onita Elspeth Sharper, DO;  Location: [facility] ENDOSCOPY;  Service: Gastroenterology;  Laterality: N/A;   CORONARY ANGIOPLASTY     ESOPHAGOGASTRODUODENOSCOPY N/A 12/28/2023   Procedure: ESOPHAGOGASTRODUODENOSCOPY (EGD);  Surgeon: Onita Elspeth Sharper, DO;  Location: [facility] ENDOSCOPY;  Service: Gastroenterology;  Laterality: N/A;   HEMOSTASIS CLIP PLACEMENT  12/28/2023   Procedure: HEMOSTASIS CLIP PLACEMENT;  Surgeon: Onita Elspeth Sharper, DO;  Location: [facility] ENDOSCOPY;  Service: Gastroenterology;;   IR IMAGING GUIDED PORT INSERTION  11/23/2024   POLYPECTOMY  12/28/2023   Procedure: POLYPECTOMY;  Surgeon: Onita Elspeth Sharper, DO;  Location: [facility] ENDOSCOPY;  Service: Gastroenterology;;   WRIST SURGERY Right    age 63  FAMILY HISTORY: Family History  Problem Relation Age of Onset   Stroke Father    Leukemia Father    Breast cancer Sister     ADVANCED DIRECTIVES (Y/N):  N  HEALTH MAINTENANCE: Social History   Tobacco Use   Smoking status: Every Day    Current packs/day: 1.00    Types: Cigarettes   Smokeless tobacco:  Never   Tobacco comments:    1 pack/day  Vaping Use   Vaping status: Never Used  Substance Use Topics   Alcohol use: Not Currently   Drug use: Yes    Types: Marijuana    Comment: daily     Colonoscopy:  PAP:  Bone density:  Lipid panel:  Allergies  Allergen Reactions   Fluoxetine Swelling    Anxiety, itching   Hydrocodone -Acetaminophen  Itching   Mirtazapine  Other (See Comments)    nightmares   Hydrochlorothiazide  Other (See Comments)    LEG CRAMPING HYPOMAGNESEMIA   Tramadol Other (See Comments)   Gabapentin  Other (See Comments)    Nightmares     Current Outpatient Medications  Medication Sig Dispense Refill   albuterol  (VENTOLIN  HFA) 108 (90 Base) MCG/ACT inhaler Inhale 2 puffs into the lungs every 6 (six) hours as needed for wheezing or shortness of breath. 18 g 1   alum & mag hydroxide-simeth (MAALOX PLUS) 400-400-40 MG/5ML suspension Take 10 mLs by mouth every 6 (six) hours as needed for indigestion. 355 mL 0   amLODipine  (NORVASC ) 10 MG tablet Take 10 mg by mouth daily.     aspirin  81 MG chewable tablet Chew 1 tablet (81 mg total) by mouth daily. 30 tablet 1   atorvastatin  (LIPITOR) 40 MG tablet Take 1 tablet (40 mg total) by mouth daily. 30 tablet 1   busPIRone  (BUSPAR ) 10 MG tablet Take 2 tablets (20 mg total) by mouth 3 (three) times daily. 180 tablet 1   clonazePAM  (KLONOPIN ) 0.5 MG tablet Take 1 tablet (0.5 mg total) by mouth 2 (two) times daily as needed (Anxiety). 60 tablet 1   cyclobenzaprine  (FLEXERIL ) 10 MG tablet Take 1 tablet (10 mg total) by mouth 2 (two) times daily as needed for muscle spasms. 20 tablet 0   escitalopram  (LEXAPRO ) 20 MG tablet Take 1 tablet (20 mg total) by mouth daily. 30 tablet 1   famotidine  (PEPCID ) 20 MG tablet Take 20 mg by mouth 2 (two) times daily.     fluticasone-salmeterol (ADVAIR) 500-50 MCG/ACT AEPB Inhale 1 puff into the lungs in the morning and at bedtime.     hydrOXYzine  (ATARAX /VISTARIL ) 25 MG tablet Take 1 tablet (25  mg total) by mouth 3 (three) times daily as needed for anxiety. 90 tablet 1   ibuprofen  (ADVIL ) 600 MG tablet Take 600 mg by mouth 3 (three) times daily.     lidocaine -prilocaine  (EMLA ) cream Apply to affected area once 30 g 3   lisinopril  (ZESTRIL ) 20 MG tablet Take 1 tablet (20 mg total) by mouth daily. 30 tablet 1   magnesium  oxide (MAG-OX) 400 (240 Mg) MG tablet Take 400 mg by mouth 2 (two) times daily. TAKE 4 TABLETS BY MOUTH 2 TIMES DAILY     metFORMIN  (GLUCOPHAGE ) 1000 MG tablet Take 1 tablet (1,000 mg total) by mouth 2 (two) times daily with a meal. 60 tablet 1   mometasone -formoterol  (DULERA ) 200-5 MCG/ACT AERO Inhale 2 puffs into the lungs 2 (two) times daily. 1 each 1   pantoprazole  (PROTONIX ) 40 MG tablet Take 1 tablet (40 mg total) by mouth daily.  30 tablet 1   pramipexole (MIRAPEX) 0.5 MG tablet Take 0.5 mg by mouth at bedtime.     QUEtiapine  (SEROQUEL  XR) 400 MG 24 hr tablet Take 1 tablet (400 mg total) by mouth at bedtime. 30 tablet 1   rOPINIRole (REQUIP XL) 2 MG 24 hr tablet Take by mouth.     tiotropium (SPIRIVA) 18 MCG inhalation capsule Place 18 mcg into inhaler and inhale daily.     zolpidem  (AMBIEN ) 10 MG tablet Take 1 tablet (10 mg total) by mouth at bedtime as needed for sleep. 30 tablet 1   magic mouthwash (multi-ingredient) oral suspension Swish and swallow 5-10 mLs 4 (four) times daily as needed. (Patient not taking: Reported on 12/06/2024) 480 mL 3   sucralfate  (CARAFATE ) 1 g tablet Take 1 tablet (1 g total) by mouth 3 (three) times daily before meals. Dissolve tablet in 4 table spoons of warm water  swish and swallow 30 minutes before meals. (Patient not taking: Reported on 12/06/2024) 90 tablet 0   No current facility-administered medications for this visit.    OBJECTIVE: Vitals:   12/06/24 1111  BP: (!) 89/74  Pulse: 84  Resp: 19  Temp: 98.6 F (37 C)  SpO2: 98%     Body mass index is 32.92 kg/m.    ECOG FS:0 - Asymptomatic  General: Well-developed,  well-nourished, no acute distress. Eyes: Pink conjunctiva, anicteric sclera. HEENT: Normocephalic, moist mucous membranes. Lungs: No audible wheezing or coughing. Heart: Regular rate and rhythm. Abdomen: Soft, nontender, no obvious distention. Musculoskeletal: No edema, cyanosis, or clubbing. Neuro: Alert, answering all questions appropriately. Cranial nerves grossly intact. Skin: No rashes or petechiae noted. Psych: Normal affect.  LAB RESULTS:  Lab Results  Component Value Date   NA 139 12/06/2024   K 4.1 12/06/2024   CL 102 12/06/2024   CO2 23 12/06/2024   GLUCOSE 122 (H) 12/06/2024   BUN 12 12/06/2024   CREATININE 1.12 12/06/2024   CALCIUM  9.5 12/06/2024   PROT 6.9 12/06/2024   ALBUMIN 4.3 12/06/2024   AST 18 12/06/2024   ALT 15 12/06/2024   ALKPHOS 129 (H) 12/06/2024   BILITOT 0.3 12/06/2024   GFRNONAA >60 12/06/2024   GFRAA >60 09/05/2020    Lab Results  Component Value Date   WBC 17.1 (H) 12/06/2024   NEUTROABS 12.2 (H) 12/06/2024   HGB 13.1 12/06/2024   HCT 38.3 (L) 12/06/2024   MCV 89.9 12/06/2024   PLT 183 12/06/2024     STUDIES: IR IMAGING GUIDED PORT INSERTION Result Date: 11/23/2024 INDICATION: chemotherapy.  History of esophageal cancer. EXAM: IMPLANTED PORT A CATH PLACEMENT WITH ULTRASOUND AND FLUOROSCOPIC GUIDANCE MEDICATIONS: None ANESTHESIA/SEDATION: Moderate (conscious) sedation was employed during this procedure. A total of Versed  2 mg and Fentanyl  100 mcg was administered intravenously. Moderate Sedation Time: 19 minutes. The patient's level of consciousness and vital signs were monitored continuously by radiology nursing throughout the procedure under my direct supervision. FLUOROSCOPY: Radiation Exposure Index and estimated peak skin dose (PSD); Reference air kerma (RAK), 0 mGy. COMPLICATIONS: None immediate. PROCEDURE: The procedure, risks, benefits, and alternatives were explained to the patient. Questions regarding the procedure were encouraged  and answered. The patient understands and consents to the procedure. The RIGHT neck and chest were prepped with chlorhexidine in a sterile fashion, and a sterile drape was applied covering the operative field. Maximum barrier sterile technique with sterile gowns and gloves were used for the procedure. A timeout was performed prior to the initiation of the procedure. Local anesthesia was  provided with 1% lidocaine  with epinephrine . After creating a small venotomy incision, a micropuncture kit was utilized to access the internal jugular vein under direct, real-time ultrasound guidance. Ultrasound image documentation was performed. The microwire was kinked to measure appropriate catheter length. A subcutaneous port pocket was then created along the upper chest wall utilizing a combination of sharp and blunt dissection. The pocket was irrigated with sterile saline. A single lumen power injectable port was chosen for placement. The 8 Fr catheter was tunneled from the port pocket site to the venotomy incision. The port was placed in the pocket. The external catheter was trimmed to appropriate length. At the venotomy, an 8 Fr peel-away sheath was placed over a guidewire under fluoroscopic guidance. The catheter was then placed through the sheath and the sheath was removed. Final catheter positioning was confirmed and documented with a fluoroscopic spot radiograph. The port was accessed with a Huber needle, aspirated and flushed with heparinized saline. The port pocket incision was closed with interrupted 3-0 Vicryl suture then Dermabond was applied, including at the venotomy incision. Dressings were placed. The patient tolerated the procedure well without immediate post procedural complication. IMPRESSION: Successful placement of a RIGHT internal jugular approach power injectable Port-A-Cath. The tip of the catheter is positioned within the proximal RIGHT atrium. The catheter is ready for immediate use. Thom Hall, MD  [facility] Electronically Signed   By: Thom Hall M.D.   On: 11/23/2024 14:41    ONCOLOGY HISTORY: Confirmed by EUS biopsy at [facility] on June 21, 2024.  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  Unclear if this is recurrent disease or a second primary.  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  Despite recommendation, patient declined surgery or any other treatments at that time.  He had been monitored at [facility] with frequent endoscopies without evidence of recurrent or progression of disease.  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  There is also central calcification reported.  This was not present at his most recent imaging 7 years ago in 2018.  PET scan results from May 27, 2024 confirmed hypermetabolism of lesion, but no other evidence of disease.  Biopsy confirmed malignancy.  After a lengthy discussion with the patient and his daughters, he wishes to proceed with treatment this time around and the recommended weekly carboplatin  and Taxol  along with concurrent XRT.  Patient completed treatment on September 22, 2024.   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  Patient was evaluated by [facility] surgical oncology and recommendation is to pursue additional neoadjuvant chemotherapy with FLOT + durvalumab  with G-CSF support every 2 weeks for an additional 4-6 cycles.  Patient has now had port placement.  Patient received cycle 1 of treatment last week and tolerated it well.  Return to clinic in 1 week for further  evaluation and consideration of cycle 2.     Headaches: Patient does not complain of this today.  He was previously instructed to increase his fluid intake and take Tylenol  intermittently with ibuprofen . Dysphagia: Resolved.  Continue Carafate  and Magic mouthwash as needed. Diarrhea: Continue Imodium  as needed. Hypomagnesia: Patient's magnesium  is dropped to 1.2.  Proceed with 1 L of IV fluids and 4 g IV magnesium  today.  2 g of IV magnesium  have been added to patient's treatment protocol. Leukocytosis: Likely from G-CSF support.  Monitor. Hypotension: 1 L fluids as above.   Patient expressed understanding and was in agreement with this plan. He also understands that He can call clinic at any time with any questions, concerns, or complaints.    Cancer Staging  Esophageal cancer [facility]) Staging form: Esophagus - Adenocarcinoma, AJCC 8th Edition - Clinical stage from 06/27/2024: Stage Unknown (cTX, cN0, cM0, G3) - Signed by Jacobo Evalene PARAS, MD on 06/27/2024 Stage prefix: Initial diagnosis Total positive nodes: 0 Histologic grading system: 3 grade system   Evalene PARAS Jacobo, MD   12/07/2024 7:40 AM

## 2024-12-07 ENCOUNTER — Encounter: Payer: Self-pay | Admitting: Oncology

## 2024-12-07 ENCOUNTER — Other Ambulatory Visit: Payer: Self-pay

## 2024-12-09 ENCOUNTER — Other Ambulatory Visit: Payer: Self-pay

## 2024-12-12 ENCOUNTER — Encounter: Payer: Self-pay | Admitting: Nurse Practitioner

## 2024-12-12 ENCOUNTER — Inpatient Hospital Stay: Payer: MEDICAID | Admitting: Nurse Practitioner

## 2024-12-12 ENCOUNTER — Inpatient Hospital Stay: Payer: MEDICAID

## 2024-12-12 VITALS — BP 95/67 | HR 81

## 2024-12-12 VITALS — BP 99/76 | HR 84 | Temp 97.9°F | Ht 73.0 in | Wt 253.0 lb

## 2024-12-12 DIAGNOSIS — C155 Malignant neoplasm of lower third of esophagus: Secondary | ICD-10-CM

## 2024-12-12 DIAGNOSIS — Z5112 Encounter for antineoplastic immunotherapy: Secondary | ICD-10-CM | POA: Diagnosis not present

## 2024-12-12 DIAGNOSIS — C159 Malignant neoplasm of esophagus, unspecified: Secondary | ICD-10-CM

## 2024-12-12 LAB — CMP (CANCER CENTER ONLY)
ALT: 19 U/L (ref 0–44)
AST: 19 U/L (ref 15–41)
Albumin: 4.2 g/dL (ref 3.5–5.0)
Alkaline Phosphatase: 116 U/L (ref 38–126)
Anion gap: 12 (ref 5–15)
BUN: 8 mg/dL (ref 8–23)
CO2: 25 mmol/L (ref 22–32)
Calcium: 9.1 mg/dL (ref 8.9–10.3)
Chloride: 102 mmol/L (ref 98–111)
Creatinine: 0.92 mg/dL (ref 0.61–1.24)
GFR, Estimated: >60 mL/min
Glucose, Bld: 115 mg/dL — ABNORMAL HIGH (ref 70–99)
Potassium: 4.2 mmol/L (ref 3.5–5.1)
Sodium: 139 mmol/L (ref 135–145)
Total Bilirubin: 0.2 mg/dL (ref 0.0–1.2)
Total Protein: 6.5 g/dL (ref 6.5–8.1)

## 2024-12-12 LAB — CBC WITH DIFFERENTIAL (CANCER CENTER ONLY)
Abs Immature Granulocytes: 0.81 K/uL — ABNORMAL HIGH (ref 0.00–0.07)
Basophils Absolute: 0.1 K/uL (ref 0.0–0.1)
Basophils Relative: 1 %
Eosinophils Absolute: 0.2 K/uL (ref 0.0–0.5)
Eosinophils Relative: 1 %
HCT: 37.6 % — ABNORMAL LOW (ref 39.0–52.0)
Hemoglobin: 12.7 g/dL — ABNORMAL LOW (ref 13.0–17.0)
Immature Granulocytes: 6 %
Lymphocytes Relative: 9 %
Lymphs Abs: 1.3 K/uL (ref 0.7–4.0)
MCH: 30.8 pg (ref 26.0–34.0)
MCHC: 33.8 g/dL (ref 30.0–36.0)
MCV: 91.3 fL (ref 80.0–100.0)
Monocytes Absolute: 0.8 K/uL (ref 0.1–1.0)
Monocytes Relative: 6 %
Neutro Abs: 11.2 K/uL — ABNORMAL HIGH (ref 1.7–7.7)
Neutrophils Relative %: 77 %
Platelet Count: 124 K/uL — ABNORMAL LOW (ref 150–400)
RBC: 4.12 MIL/uL — ABNORMAL LOW (ref 4.22–5.81)
RDW: 14.8 % (ref 11.5–15.5)
WBC Count: 14.4 K/uL — ABNORMAL HIGH (ref 4.0–10.5)
nRBC: 0.1 % (ref 0.0–0.2)

## 2024-12-12 LAB — MAGNESIUM: Magnesium: 1.3 mg/dL — ABNORMAL LOW (ref 1.7–2.4)

## 2024-12-12 MED ORDER — LEUCOVORIN CALCIUM INJECTION 350 MG
200.0000 mg/m2 | Freq: Once | INTRAVENOUS | Status: AC
  Administered 2024-12-12: 484 mg via INTRAVENOUS
  Filled 2024-12-12: qty 24.2

## 2024-12-12 MED ORDER — SODIUM CHLORIDE 0.9 % IV SOLN
2.0000 g | Freq: Once | INTRAVENOUS | Status: DC
  Filled 2024-12-12: qty 4

## 2024-12-12 MED ORDER — SODIUM CHLORIDE 0.9 % IV SOLN
INTRAVENOUS | Status: DC
  Filled 2024-12-12: qty 250

## 2024-12-12 MED ORDER — LORAZEPAM 2 MG/ML IJ SOLN
1.0000 mg | Freq: Once | INTRAMUSCULAR | Status: AC
  Administered 2024-12-12: 1 mg via INTRAVENOUS
  Filled 2024-12-12: qty 1

## 2024-12-12 MED ORDER — SODIUM CHLORIDE 0.9 % IV SOLN
50.0000 mg/m2 | Freq: Once | INTRAVENOUS | Status: AC
  Administered 2024-12-12: 121 mg via INTRAVENOUS
  Filled 2024-12-12: qty 12.1

## 2024-12-12 MED ORDER — DEXTROSE 5 % IV SOLN
INTRAVENOUS | Status: DC
  Filled 2024-12-12: qty 250

## 2024-12-12 MED ORDER — MAGNESIUM SULFATE 2 GM/50ML IV SOLN: 2.0000 g | Freq: Once | INTRAVENOUS | Status: DC

## 2024-12-12 MED ORDER — SODIUM CHLORIDE 0.9 % IV SOLN
Freq: Once | INTRAVENOUS | Status: AC
  Filled 2024-12-12: qty 250

## 2024-12-12 MED ORDER — PALONOSETRON HCL INJECTION 0.25 MG/5ML
0.2500 mg | Freq: Once | INTRAVENOUS | Status: AC
  Administered 2024-12-12: 0.25 mg via INTRAVENOUS
  Filled 2024-12-12: qty 5

## 2024-12-12 MED ORDER — OXALIPLATIN CHEMO INJECTION 100 MG/20ML
85.0000 mg/m2 | Freq: Once | INTRAVENOUS | Status: AC
  Administered 2024-12-12: 200 mg via INTRAVENOUS
  Filled 2024-12-12: qty 40

## 2024-12-12 MED ORDER — SODIUM CHLORIDE 0.9 % IV SOLN
2600.0000 mg/m2 | INTRAVENOUS | Status: DC
  Administered 2024-12-12: 6300 mg via INTRAVENOUS
  Filled 2024-12-12: qty 126

## 2024-12-12 MED ORDER — MAGNESIUM SULFATE 2 GM/50ML IV SOLN
2.0000 g | Freq: Once | INTRAVENOUS | Status: AC
  Administered 2024-12-12: 2 g via INTRAVENOUS
  Filled 2024-12-12: qty 50

## 2024-12-12 MED ORDER — DEXAMETHASONE SOD PHOSPHATE PF 10 MG/ML IJ SOLN
10.0000 mg | Freq: Once | INTRAMUSCULAR | Status: AC
  Administered 2024-12-12: 10 mg via INTRAVENOUS

## 2024-12-12 NOTE — Progress Notes (Signed)
 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  INTERVAL HISTORY: Patient returns to clinic today for further evaluation and to assess his toleration of FLOT + durvalumab .  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  He also had nausea for approximately 1 to 2 days after his pump removal, but otherwise tolerated his treatment well.  He has no neurologic complaints.  He does not complain of dysphagia today.  He does not complain of pain today.  He denies any recent fevers or illnesses.  He has a good appetite and denies weight loss.  He has no chest pain, shortness of breath, cough, or hemoptysis.  He denies any nausea, vomiting, constipation, or diarrhea.  He has no urinary complaints.  Patient offers no further specific complaints today.  He reports overall feeling ok but does have anxiety while getting his treatment.   REVIEW OF SYSTEMS:   Review of Systems  Constitutional: Negative.  Negative for fever, malaise/fatigue and weight loss.  HENT:  Negative for sore throat.   Respiratory: Negative.  Negative for cough, hemoptysis and shortness of breath.   Cardiovascular: Negative.  Negative for chest pain and leg swelling.  Gastrointestinal: Negative.  Negative for abdominal pain.  Genitourinary: Negative.  Negative for dysuria.  Musculoskeletal: Negative.  Negative for back pain.  Skin: Negative.  Negative for rash.  Neurological: Negative.  Negative for dizziness, focal weakness, weakness and headaches.  Psychiatric/Behavioral: Negative.  The patient is not nervous/anxious.     As per HPI.  Otherwise, a complete review of systems is negative.  PAST MEDICAL HISTORY: Past Medical History:  Diagnosis Date   Anxiety    Barrett esophagus    Bipolar disorder (HCC)    Cancer (HCC)    Chronic hepatitis C (HCC)    COPD (chronic obstructive pulmonary disease) (HCC)    Coronary artery disease    Depression    Diabetes mellitus without complication (HCC)    Dyslipidemia    GERD (gastroesophageal reflux disease)    Hepatitis    Hypertension    Leukocytosis    Pre-diabetes    Primary malignant neoplasm (HCC)    Stroke (HCC) 2014   mini-stroke per patient    PAST SURGICAL HISTORY: Past Surgical History:  Procedure Laterality Date   ANGIOPLASTY     APPENDECTOMY     BIOPSY  12/28/2023   Procedure: BIOPSY;  Surgeon: Onita Elspeth Sharper, DO;  Location: [facility] ENDOSCOPY;  Service: Gastroenterology;;   CARDIAC CATHETERIZATION     CHOLECYSTECTOMY     COLONOSCOPY WITH PROPOFOL  N/A 12/28/2023   Procedure: COLONOSCOPY WITH PROPOFOL ;  Surgeon: Onita Elspeth Sharper, DO;  Location: [facility] ENDOSCOPY;  Service: Gastroenterology;  Laterality: N/A;   CORONARY ANGIOPLASTY     ESOPHAGOGASTRODUODENOSCOPY N/A 12/28/2023   Procedure: ESOPHAGOGASTRODUODENOSCOPY (EGD);  Surgeon: Onita Elspeth Sharper, DO;  Location: [facility] ENDOSCOPY;  Service: Gastroenterology;  Laterality: N/A;   HEMOSTASIS CLIP PLACEMENT  12/28/2023   Procedure: HEMOSTASIS CLIP PLACEMENT;  Surgeon: Onita Elspeth Sharper, DO;  Location[facility] ENDOSCOPY;  Service: Gastroenterology;;   IR IMAGING GUIDED PORT INSERTION  11/23/2024   POLYPECTOMY  12/28/2023   Procedure: POLYPECTOMY;  Surgeon: Onita Elspeth Sharper, DO;  Location:  [facility] ENDOSCOPY;  Service: Gastroenterology;;   WRIST SURGERY Right    age 63    FAMILY HISTORY: Family History  Problem Relation Age of Onset   Stroke Father    Leukemia Father    Breast cancer Sister     ADVANCED DIRECTIVES (Y/N):  N  HEALTH MAINTENANCE: Social History   Tobacco Use   Smoking status:  Every Day    Current packs/day: 1.00    Types: Cigarettes   Smokeless tobacco: Never   Tobacco comments:    1 pack/day  Vaping Use   Vaping status: Never Used  Substance Use Topics   Alcohol use: Not Currently   Drug use: Yes    Types: Marijuana    Comment: daily     Colonoscopy:  PAP:  Bone density:  Lipid panel:  Allergies  Allergen Reactions   Fluoxetine Swelling    Anxiety, itching   Hydrocodone -Acetaminophen  Itching   Mirtazapine  Other (See Comments)    nightmares   Hydrochlorothiazide  Other (See Comments)    LEG CRAMPING HYPOMAGNESEMIA   Tramadol Other (See Comments)   Gabapentin  Other (See Comments)    Nightmares     Current Outpatient Medications  Medication Sig Dispense Refill   albuterol  (VENTOLIN  HFA) 108 (90 Base) MCG/ACT inhaler Inhale 2 puffs into the lungs every 6 (six) hours as needed for wheezing or shortness of breath. 18 g 1   alum & mag hydroxide-simeth (MAALOX PLUS) 400-400-40 MG/5ML suspension Take 10 mLs by mouth every 6 (six) hours as needed for indigestion. 355 mL 0   amLODipine  (NORVASC ) 10 MG tablet Take 10 mg by mouth daily.     aspirin  81 MG chewable tablet Chew 1 tablet (81 mg total) by mouth daily. 30 tablet 1   atorvastatin  (LIPITOR) 40 MG tablet Take 1 tablet (40 mg total) by mouth daily. 30 tablet 1   busPIRone  (BUSPAR ) 10 MG tablet Take 2 tablets (20 mg total) by mouth 3 (three) times daily. 180 tablet 1   clonazePAM  (KLONOPIN ) 0.5 MG tablet Take 1 tablet (0.5 mg total) by mouth 2 (two) times daily as needed (Anxiety). 60 tablet 1   cyclobenzaprine  (FLEXERIL ) 10 MG tablet Take 1 tablet (10 mg total) by mouth 2 (two) times daily as needed for muscle spasms. 20 tablet 0   escitalopram  (LEXAPRO ) 20 MG tablet Take 1 tablet (20 mg total) by mouth daily. 30 tablet 1   famotidine  (PEPCID ) 20 MG tablet Take 20 mg by mouth 2 (two) times daily.     fluticasone-salmeterol (ADVAIR) 500-50 MCG/ACT AEPB Inhale 1 puff into the lungs in the morning  and at bedtime.     hydrOXYzine  (ATARAX /VISTARIL ) 25 MG tablet Take 1 tablet (25 mg total) by mouth 3 (three) times daily as needed for anxiety. 90 tablet 1   ibuprofen  (ADVIL ) 600 MG tablet Take 600 mg by mouth 3 (three) times daily.     lidocaine -prilocaine  (EMLA ) cream Apply to affected area once 30 g 3   lisinopril  (ZESTRIL ) 20 MG tablet Take 1 tablet (20 mg total) by mouth daily. 30 tablet 1   magnesium  oxide (MAG-OX) 400 (240 Mg) MG tablet Take 400 mg by mouth 2 (two) times daily. TAKE 4 TABLETS BY MOUTH 2 TIMES DAILY     metFORMIN  (GLUCOPHAGE ) 1000 MG tablet Take 1 tablet (1,000 mg total) by mouth 2 (two) times daily with a meal. 60 tablet 1   mometasone -formoterol  (DULERA ) 200-5 MCG/ACT AERO Inhale 2 puffs into the lungs 2 (two) times daily. 1  each 1   pantoprazole  (PROTONIX ) 40 MG tablet Take 1 tablet (40 mg total) by mouth daily. 30 tablet 1   pramipexole (MIRAPEX) 0.5 MG tablet Take 0.5 mg by mouth at bedtime.     QUEtiapine  (SEROQUEL  XR) 400 MG 24 hr tablet Take 1 tablet (400 mg total) by mouth at bedtime. 30 tablet 1   rOPINIRole (REQUIP XL) 2 MG 24 hr tablet Take by mouth.     tiotropium (SPIRIVA) 18 MCG inhalation capsule Place 18 mcg into inhaler and inhale daily.     zolpidem  (AMBIEN ) 10 MG tablet Take 1 tablet (10 mg total) by mouth at bedtime as needed for sleep. 30 tablet 1   magic mouthwash (multi-ingredient) oral suspension Swish and swallow 5-10 mLs 4 (four) times daily as needed. (Patient not taking: Reported on 12/06/2024) 480 mL 3   sucralfate  (CARAFATE ) 1 g tablet Take 1 tablet (1 g total) by mouth 3 (three) times daily before meals. Dissolve tablet in 4 table spoons of warm water  swish and swallow 30 minutes before meals. (Patient not taking: Reported on 12/06/2024) 90 tablet 0   No current facility-administered medications for this visit.   Facility-Administered Medications Ordered in Other Visits  Medication Dose Route Frequency Provider Last Rate Last Admin   0.9 %   sodium chloride  infusion   Intravenous Continuous Jacobo, Evalene PARAS, MD       dextrose  5 % solution   Intravenous Continuous Jacobo Evalene PARAS, MD 10 mL/hr at 12/12/24 1329 New Bag at 12/12/24 1329   fluorouracil  (ADRUCIL ) 6,300 mg in sodium chloride  0.9 % 124 mL chemo infusion  2,600 mg/m2 (Treatment Plan Recorded) Intravenous 1 day or 1 dose Finnegan, Timothy J, MD       leucovorin  484 mg in dextrose  5 % 250 mL infusion  200 mg/m2 (Treatment Plan Recorded) Intravenous Once Finnegan, Timothy J, MD 137 mL/hr at 12/12/24 1330 484 mg at 12/12/24 1330   oxaliplatin  (ELOXATIN ) 200 mg in dextrose  5 % 500 mL chemo infusion  85 mg/m2 (Treatment Plan Recorded) Intravenous Once Finnegan, Timothy J, MD 270 mL/hr at 12/12/24 1332 200 mg at 12/12/24 1332    OBJECTIVE: Vitals:   12/12/24 0904  BP: 99/76  Pulse: 84  Temp: 97.9 F (36.6 C)  SpO2: 97%     Body mass index is 33.38 kg/m.    ECOG FS:0 - Asymptomatic  General: Well-developed, well-nourished, no acute distress. Eyes: Pink conjunctiva, anicteric sclera. HEENT: Normocephalic, moist mucous membranes. Lungs: No audible wheezing or coughing. Heart: Regular rate and rhythm. Abdomen: Soft, nontender, no obvious distention. Musculoskeletal: No edema, cyanosis, or clubbing. Neuro: Alert, answering all questions appropriately. Cranial nerves grossly intact. Skin: No rashes or petechiae noted. Psych: Normal affect.  LAB RESULTS:  Lab Results  Component Value Date   NA 139 12/12/2024   K 4.2 12/12/2024   CL 102 12/12/2024   CO2 25 12/12/2024   GLUCOSE 115 (H) 12/12/2024   BUN 8 12/12/2024   CREATININE 0.92 12/12/2024   CALCIUM  9.1 12/12/2024   PROT 6.5 12/12/2024   ALBUMIN 4.2 12/12/2024   AST 19 12/12/2024   ALT 19 12/12/2024   ALKPHOS 116 12/12/2024   BILITOT 0.2 12/12/2024   GFRNONAA >60 12/12/2024   GFRAA >60 09/05/2020    Lab Results  Component Value Date   WBC 14.4 (H) 12/12/2024   NEUTROABS 11.2 (H) 12/12/2024   HGB  12.7 (L) 12/12/2024   HCT 37.6 (L) 12/12/2024   MCV 91.3 12/12/2024   PLT  124 (L) 12/12/2024   Lab Results  Component Value Date   MG 1.3 (L) 12/12/2024   MG 1.2 (L) 12/06/2024   MG 1.6 (L) 11/28/2024     STUDIES: IR IMAGING GUIDED PORT INSERTION Result Date: 11/23/2024 INDICATION: chemotherapy.  History of esophageal cancer. EXAM: IMPLANTED PORT A CATH PLACEMENT WITH ULTRASOUND AND FLUOROSCOPIC GUIDANCE MEDICATIONS: None ANESTHESIA/SEDATION: Moderate (conscious) sedation was employed during this procedure. A total of Versed  2 mg and Fentanyl  100 mcg was administered intravenously. Moderate Sedation Time: 19 minutes. The patient's level of consciousness and vital signs were monitored continuously by radiology nursing throughout the procedure under my direct supervision. FLUOROSCOPY: Radiation Exposure Index and estimated peak skin dose (PSD); Reference air kerma (RAK), 0 mGy. COMPLICATIONS: None immediate. PROCEDURE: The procedure, risks, benefits, and alternatives were explained to the patient. Questions regarding the procedure were encouraged and answered. The patient understands and consents to the procedure. The RIGHT neck and chest were prepped with chlorhexidine in a sterile fashion, and a sterile drape was applied covering the operative field. Maximum barrier sterile technique with sterile gowns and gloves were used for the procedure. A timeout was performed prior to the initiation of the procedure. Local anesthesia was provided with 1% lidocaine  with epinephrine . After creating a small venotomy incision, a micropuncture kit was utilized to access the internal jugular vein under direct, real-time ultrasound guidance. Ultrasound image documentation was performed. The microwire was kinked to measure appropriate catheter length. A subcutaneous port pocket was then created along the upper chest wall utilizing a combination of sharp and blunt dissection. The pocket was irrigated with sterile saline.  A single lumen power injectable port was chosen for placement. The 8 Fr catheter was tunneled from the port pocket site to the venotomy incision. The port was placed in the pocket. The external catheter was trimmed to appropriate length. At the venotomy, an 8 Fr peel-away sheath was placed over a guidewire under fluoroscopic guidance. The catheter was then placed through the sheath and the sheath was removed. Final catheter positioning was confirmed and documented with a fluoroscopic spot radiograph. The port was accessed with a Huber needle, aspirated and flushed with heparinized saline. The port pocket incision was closed with interrupted 3-0 Vicryl suture then Dermabond was applied, including at the venotomy incision. Dressings were placed. The patient tolerated the procedure well without immediate post procedural complication. IMPRESSION: Successful placement of a RIGHT internal jugular approach power injectable Port-A-Cath. The tip of the catheter is positioned within the proximal RIGHT atrium. The catheter is ready for immediate use. Thom Hall, MD [facility] Electronically Signed   By: Thom Hall M.D.   On: 11/23/2024 14:41    ONCOLOGY HISTORY: Confirmed by EUS biopsy at [facility] on June 21, 2024.  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  Unclear if this is recurrent disease or a second primary.  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  Despite recommendation, patient declined surgery or any other treatments at that time.  He had been monitored at [facility] with frequent endoscopies without evidence of recurrent or progression of disease.  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   There is also central calcification reported.  This was not present at his most  recent imaging 7 years ago in 2018.  PET scan results from May 27, 2024 confirmed hypermetabolism of lesion, but no other evidence of disease.  Biopsy confirmed malignancy.  After a lengthy discussion with the patient and his daughters, he wishes to proceed with treatment this time around and the recommended weekly carboplatin  and Taxol  along with concurrent XRT.  Patient completed treatment on September 22, 2024.   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  Patient was evaluated by [facility] surgical oncology and recommendation is to pursue additional neoadjuvant chemotherapy with FLOT + durvalumab  with G-CSF support every 2 weeks for an additional 4-6 cycles.  Patient has now had port placement.  Patient received cycle 1 of treatment last week and tolerated it well.  Proceed with cycle 2 today.    Headaches: Patient does not complain of this today.  He was previously instructed to increase his fluid intake and take Tylenol  intermittently with ibuprofen . Dysphagia: Resolved.  Continue Carafate  and Magic mouthwash as needed. Diarrhea: Continue Imodium as needed. No changes in frequency Hypomagnesia: Patient's magnesium  is dropped to 1.2.  Proceed with 1 L of IV fluids and 4 g IV magnesium  today.  2 g of IV magnesium  have been added to patient's treatment protocol.  Proceed with 2 gm Mg today with treatment Mag 1.3 Leukocytosis: Likely from G-CSF support.  Monitor. No s/sx of infection Hypotension: Proceed with  1 L fluids today with treatment. Anxiety: patient with a history of anxiety reports increased anxiety on treatment days added 1 mg ativan  to premeds today.  He has a history of bipolar 1 disorder with anxiety continue with home meds that include buspar ,  klonopin , lexapro , and seroquel .  Defer management to psych.      Patient expressed understanding and was in agreement with this plan. He also understands that He can call clinic at any time with any questions, concerns, or complaints.    Cancer Staging  Esophageal cancer [facility]) Staging form: Esophagus - Adenocarcinoma, AJCC 8th Edition - Clinical stage from 06/27/2024: Stage Unknown (cTX, cN0, cM0, G3) - Signed by Jacobo Evalene PARAS, MD on 06/27/2024 Stage prefix: Initial diagnosis Total positive nodes: 0 Histologic grading system: 3 grade system  Follow up plan:  Proceed with treatment today Proceed with IVF bolus and Mg today F/U per [facility]    Morna Husband, NP   12/12/2024 2:15 PM     "

## 2024-12-12 NOTE — Progress Notes (Signed)
 Patient states he would like to discuss Klonopin  refills, he states he uses this medication for anxiety, especially at appointments.

## 2024-12-13 ENCOUNTER — Inpatient Hospital Stay: Payer: MEDICAID

## 2024-12-13 VITALS — BP 114/77 | HR 92 | Resp 18

## 2024-12-13 DIAGNOSIS — C155 Malignant neoplasm of lower third of esophagus: Secondary | ICD-10-CM

## 2024-12-13 DIAGNOSIS — Z5112 Encounter for antineoplastic immunotherapy: Secondary | ICD-10-CM | POA: Diagnosis not present

## 2024-12-13 MED ORDER — PEGFILGRASTIM-CBQV 6 MG/0.6ML ~~LOC~~ SOSY
6.0000 mg | PREFILLED_SYRINGE | Freq: Once | SUBCUTANEOUS | Status: AC
  Administered 2024-12-13: 6 mg via SUBCUTANEOUS
  Filled 2024-12-13: qty 0.6

## 2024-12-14 ENCOUNTER — Inpatient Hospital Stay: Payer: MEDICAID

## 2024-12-19 ENCOUNTER — Other Ambulatory Visit: Payer: Self-pay | Admitting: Oncology

## 2024-12-21 ENCOUNTER — Other Ambulatory Visit: Payer: Self-pay

## 2024-12-23 ENCOUNTER — Ambulatory Visit: Admit: 2024-12-23 | Payer: Medicaid (Managed Care)

## 2024-12-26 ENCOUNTER — Encounter: Payer: Self-pay | Admitting: Oncology

## 2024-12-26 ENCOUNTER — Other Ambulatory Visit: Payer: Self-pay

## 2024-12-26 ENCOUNTER — Inpatient Hospital Stay: Payer: MEDICAID

## 2024-12-26 ENCOUNTER — Inpatient Hospital Stay: Payer: MEDICAID | Attending: Oncology

## 2024-12-26 ENCOUNTER — Inpatient Hospital Stay (HOSPITAL_BASED_OUTPATIENT_CLINIC_OR_DEPARTMENT_OTHER): Payer: MEDICAID | Admitting: Oncology

## 2024-12-26 VITALS — BP 99/66 | HR 84 | Temp 97.2°F | Resp 18 | Ht 73.0 in | Wt 251.0 lb

## 2024-12-26 DIAGNOSIS — Z5112 Encounter for antineoplastic immunotherapy: Secondary | ICD-10-CM | POA: Insufficient documentation

## 2024-12-26 DIAGNOSIS — Z0189 Encounter for other specified special examinations: Secondary | ICD-10-CM | POA: Diagnosis not present

## 2024-12-26 DIAGNOSIS — Z5111 Encounter for antineoplastic chemotherapy: Secondary | ICD-10-CM | POA: Insufficient documentation

## 2024-12-26 DIAGNOSIS — C155 Malignant neoplasm of lower third of esophagus: Secondary | ICD-10-CM

## 2024-12-26 DIAGNOSIS — Z5189 Encounter for other specified aftercare: Secondary | ICD-10-CM | POA: Insufficient documentation

## 2024-12-26 DIAGNOSIS — Z79899 Other long term (current) drug therapy: Secondary | ICD-10-CM | POA: Insufficient documentation

## 2024-12-26 LAB — CMP (CANCER CENTER ONLY)
ALT: 23 U/L (ref 0–44)
AST: 22 U/L (ref 15–41)
Albumin: 4.1 g/dL (ref 3.5–5.0)
Alkaline Phosphatase: 136 U/L — ABNORMAL HIGH (ref 38–126)
Anion gap: 11 (ref 5–15)
BUN: 11 mg/dL (ref 8–23)
CO2: 24 mmol/L (ref 22–32)
Calcium: 7.9 mg/dL — ABNORMAL LOW (ref 8.9–10.3)
Chloride: 102 mmol/L (ref 98–111)
Creatinine: 0.96 mg/dL (ref 0.61–1.24)
GFR, Estimated: >60 mL/min
Glucose, Bld: 125 mg/dL — ABNORMAL HIGH (ref 70–99)
Potassium: 3.7 mmol/L (ref 3.5–5.1)
Sodium: 136 mmol/L (ref 135–145)
Total Bilirubin: 0.2 mg/dL (ref 0.0–1.2)
Total Protein: 6.5 g/dL (ref 6.5–8.1)

## 2024-12-26 LAB — CBC WITH DIFFERENTIAL (CANCER CENTER ONLY)
Abs Immature Granulocytes: 1.32 K/uL — ABNORMAL HIGH (ref 0.00–0.07)
Basophils Absolute: 0.1 K/uL (ref 0.0–0.1)
Basophils Relative: 1 %
Eosinophils Absolute: 0.3 K/uL (ref 0.0–0.5)
Eosinophils Relative: 2 %
HCT: 37.4 % — ABNORMAL LOW (ref 39.0–52.0)
Hemoglobin: 12.5 g/dL — ABNORMAL LOW (ref 13.0–17.0)
Immature Granulocytes: 7 %
Lymphocytes Relative: 7 %
Lymphs Abs: 1.4 K/uL (ref 0.7–4.0)
MCH: 30.9 pg (ref 26.0–34.0)
MCHC: 33.4 g/dL (ref 30.0–36.0)
MCV: 92.3 fL (ref 80.0–100.0)
Monocytes Absolute: 1 K/uL (ref 0.1–1.0)
Monocytes Relative: 5 %
Neutro Abs: 16.2 K/uL — ABNORMAL HIGH (ref 1.7–7.7)
Neutrophils Relative %: 78 %
Platelet Count: 126 K/uL — ABNORMAL LOW (ref 150–400)
RBC: 4.05 MIL/uL — ABNORMAL LOW (ref 4.22–5.81)
RDW: 14.4 % (ref 11.5–15.5)
WBC Count: 20.4 K/uL — ABNORMAL HIGH (ref 4.0–10.5)
nRBC: 0.1 % (ref 0.0–0.2)

## 2024-12-26 LAB — MAGNESIUM: Magnesium: 1.1 mg/dL — ABNORMAL LOW (ref 1.7–2.4)

## 2024-12-26 MED ORDER — PALONOSETRON HCL INJECTION 0.25 MG/5ML
0.2500 mg | Freq: Once | INTRAVENOUS | Status: AC
  Administered 2024-12-26: 0.25 mg via INTRAVENOUS
  Filled 2024-12-26: qty 5

## 2024-12-26 MED ORDER — OXALIPLATIN CHEMO INJECTION 100 MG/20ML
85.0000 mg/m2 | Freq: Once | INTRAVENOUS | Status: AC
  Administered 2024-12-26: 200 mg via INTRAVENOUS
  Filled 2024-12-26: qty 40

## 2024-12-26 MED ORDER — DEXAMETHASONE SOD PHOSPHATE PF 10 MG/ML IJ SOLN
10.0000 mg | Freq: Once | INTRAMUSCULAR | Status: AC
  Administered 2024-12-26: 10 mg via INTRAVENOUS

## 2024-12-26 MED ORDER — DEXTROSE 5 % IV SOLN
INTRAVENOUS | Status: DC
  Filled 2024-12-26: qty 250

## 2024-12-26 MED ORDER — SODIUM CHLORIDE 0.9 % IV SOLN
INTRAVENOUS | Status: DC
  Filled 2024-12-26: qty 250

## 2024-12-26 MED ORDER — LEUCOVORIN CALCIUM INJECTION 350 MG
200.0000 mg/m2 | Freq: Once | INTRAVENOUS | Status: AC
  Administered 2024-12-26: 484 mg via INTRAVENOUS
  Filled 2024-12-26: qty 24.2

## 2024-12-26 MED ORDER — SODIUM CHLORIDE 0.9 % IV SOLN
2600.0000 mg/m2 | INTRAVENOUS | Status: DC
  Filled 2024-12-26: qty 126

## 2024-12-26 MED ORDER — MAGNESIUM SULFATE 4 GM/100ML IV SOLN
4.0000 g | Freq: Once | INTRAVENOUS | Status: AC
  Administered 2024-12-26: 4 g via INTRAVENOUS
  Filled 2024-12-26: qty 100

## 2024-12-26 MED ORDER — SODIUM CHLORIDE 0.9 % IV SOLN
2600.0000 mg/m2 | INTRAVENOUS | Status: DC
  Administered 2024-12-26: 6300 mg via INTRAVENOUS
  Filled 2024-12-26: qty 126

## 2024-12-26 MED ORDER — SODIUM CHLORIDE 0.9 % IV SOLN
1500.0000 mg | Freq: Once | INTRAVENOUS | Status: AC
  Administered 2024-12-26: 1500 mg via INTRAVENOUS
  Filled 2024-12-26: qty 30

## 2024-12-26 MED ORDER — SODIUM CHLORIDE 0.9 % IV SOLN
50.0000 mg/m2 | Freq: Once | INTRAVENOUS | Status: AC
  Administered 2024-12-26: 121 mg via INTRAVENOUS
  Filled 2024-12-26: qty 12.1

## 2024-12-26 NOTE — Progress Notes (Signed)
 Dr Jacobo alerted to interaction between Seroquel  and 5-fu causing prolongation of QT interval.  EKG ordered during infusion on 12/26/24

## 2024-12-26 NOTE — Progress Notes (Signed)
 Patient is severely depressed, he just lost his sister over the weekend. He wanted to ask a serious question this morning, he wants to know if this treatment is doing any good, because he is getting about ready to give up.

## 2024-12-26 NOTE — Progress Notes (Signed)
 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  INTERVAL HISTORY: Patient returns to clinic today for further evaluation and consideration of cycle 2 of FLOT + durvalumab .  He has increased weakness and fatigue, but otherwise feels well.  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  He does not complain of dysphagia today.  He does not complain of pain today.  He denies any recent fevers or illnesses.  He has a good appetite and denies weight loss.  He has no chest pain, shortness of breath, cough, or hemoptysis.  He denies any nausea, vomiting, constipation, or diarrhea.  He has no urinary complaints.  Patient offers no further specific complaints today.  REVIEW OF SYSTEMS:   Review of Systems  Constitutional: Negative.  Negative for fever, malaise/fatigue and weight loss.  HENT:  Negative for sore throat.   Respiratory: Negative.  Negative for cough, hemoptysis and shortness of breath.   Cardiovascular: Negative.  Negative for chest pain and leg swelling.  Gastrointestinal: Negative.  Negative for abdominal pain.  Genitourinary: Negative.  Negative for dysuria.  Musculoskeletal: Negative.  Negative for back pain.  Skin: Negative.  Negative for rash.  Neurological: Negative.  Negative for dizziness, focal weakness, weakness and headaches.  Psychiatric/Behavioral: Negative.  The patient is not nervous/anxious.     As per HPI. Otherwise, a complete review of systems is negative.  PAST MEDICAL HISTORY: Past Medical History:  Diagnosis Date   Anxiety     Barrett esophagus    Bipolar disorder (HCC)    Cancer (HCC)    Chronic hepatitis C (HCC)    COPD (chronic obstructive pulmonary disease) (HCC)    Coronary artery disease    Depression    Diabetes mellitus without complication (HCC)    Dyslipidemia    GERD (gastroesophageal reflux disease)    Hepatitis    Hypertension    Leukocytosis    Pre-diabetes    Primary malignant neoplasm (HCC)    Stroke (HCC) 2014   mini-stroke per patient    PAST SURGICAL HISTORY: Past Surgical History:  Procedure Laterality Date   ANGIOPLASTY     APPENDECTOMY     BIOPSY  12/28/2023   Procedure: BIOPSY;  Surgeon: Onita Elspeth Sharper, DO;  Location: [facility] ENDOSCOPY;  Service: Gastroenterology;;   CARDIAC CATHETERIZATION     CHOLECYSTECTOMY     COLONOSCOPY WITH PROPOFOL  N/A 12/28/2023   Procedure: COLONOSCOPY WITH PROPOFOL ;  Surgeon: Onita Elspeth Sharper, DO;  Location: [facility] ENDOSCOPY;  Service: Gastroenterology;  Laterality: N/A;   CORONARY ANGIOPLASTY     ESOPHAGOGASTRODUODENOSCOPY N/A 12/28/2023   Procedure: ESOPHAGOGASTRODUODENOSCOPY (EGD);  Surgeon: Onita Elspeth Sharper, DO;  Location: [facility] ENDOSCOPY;  Service: Gastroenterology;  Laterality: N/A;   HEMOSTASIS CLIP PLACEMENT  12/28/2023   Procedure: HEMOSTASIS CLIP PLACEMENT;  Surgeon: Onita Elspeth Sharper, DO;  Location: [place] ENDOSCOPY;  Service: Gastroenterology;;   IR IMAGING GUIDED PORT INSERTION  11/23/2024   POLYPECTOMY  12/28/2023   Procedure: POLYPECTOMY;  Surgeon: Onita Elspeth Sharper, DO;  Location: [facility] ENDOSCOPY;  Service: Gastroenterology;;   WRIST SURGERY Right    age 64    FAMILY HISTORY: Family History  Problem Relation  Age of Onset   Stroke Father    Leukemia Father    Breast cancer Sister     ADVANCED DIRECTIVES (Y/N):  N  HEALTH MAINTENANCE: Social History   Tobacco Use   Smoking status: Every Day    Current packs/day: 1.00    Types: Cigarettes   Smokeless tobacco: Never   Tobacco comments:    1 pack/day  Vaping  Use   Vaping status: Never Used  Substance Use Topics   Alcohol use: Not Currently   Drug use: Yes    Types: Marijuana    Comment: daily     Colonoscopy:  PAP:  Bone density:  Lipid panel:  Allergies  Allergen Reactions   Fluoxetine Swelling    Anxiety, itching   Hydrocodone -Acetaminophen  Itching   Mirtazapine  Other (See Comments)    nightmares   Hydrochlorothiazide  Other (See Comments)    LEG CRAMPING HYPOMAGNESEMIA   Tramadol Other (See Comments)   Gabapentin  Other (See Comments)    Nightmares     Current Outpatient Medications  Medication Sig Dispense Refill   albuterol  (VENTOLIN  HFA) 108 (90 Base) MCG/ACT inhaler Inhale 2 puffs into the lungs every 6 (six) hours as needed for wheezing or shortness of breath. 18 g 1   alum & mag hydroxide-simeth (MAALOX PLUS) 400-400-40 MG/5ML suspension Take 10 mLs by mouth every 6 (six) hours as needed for indigestion. 355 mL 0   amLODipine  (NORVASC ) 10 MG tablet Take 10 mg by mouth daily.     aspirin  81 MG chewable tablet Chew 1 tablet (81 mg total) by mouth daily. 30 tablet 1   atorvastatin  (LIPITOR) 40 MG tablet Take 1 tablet (40 mg total) by mouth daily. 30 tablet 1   busPIRone  (BUSPAR ) 10 MG tablet Take 2 tablets (20 mg total) by mouth 3 (three) times daily. 180 tablet 1   clonazePAM  (KLONOPIN ) 0.5 MG tablet Take 1 tablet (0.5 mg total) by mouth 2 (two) times daily as needed (Anxiety). 60 tablet 1   cyclobenzaprine  (FLEXERIL ) 10 MG tablet Take 1 tablet (10 mg total) by mouth 2 (two) times daily as needed for muscle spasms. 20 tablet 0   escitalopram  (LEXAPRO ) 20 MG tablet Take 1 tablet (20 mg total) by mouth daily. 30 tablet 1   famotidine  (PEPCID ) 20 MG tablet Take 20 mg by mouth 2 (two) times daily.     fluticasone-salmeterol (ADVAIR) 500-50 MCG/ACT AEPB Inhale 1 puff into the lungs in the morning and at bedtime.     hydrOXYzine  (ATARAX /VISTARIL ) 25 MG tablet Take 1 tablet (25 mg total) by mouth 3 (three) times daily as needed  for anxiety. 90 tablet 1   ibuprofen  (ADVIL ) 600 MG tablet Take 600 mg by mouth 3 (three) times daily.     lidocaine -prilocaine  (EMLA ) cream Apply to affected area once 30 g 3   lisinopril  (ZESTRIL ) 20 MG tablet Take 1 tablet (20 mg total) by mouth daily. 30 tablet 1   magic mouthwash (multi-ingredient) oral suspension Swish and swallow 5-10 mLs 4 (four) times daily as needed. (Patient not taking: Reported on 12/06/2024) 480 mL 3   magnesium  oxide (MAG-OX) 400 (240 Mg) MG tablet Take 400 mg by mouth 2 (two) times daily. TAKE 4 TABLETS BY MOUTH 2 TIMES DAILY     metFORMIN  (GLUCOPHAGE ) 1000 MG tablet Take 1 tablet (1,000 mg total) by mouth 2 (two) times daily with a meal. 60 tablet 1   mometasone -formoterol  (DULERA ) 200-5 MCG/ACT AERO Inhale 2 puffs into the lungs 2 (two) times  daily. 1 each 1   pantoprazole  (PROTONIX ) 40 MG tablet Take 1 tablet (40 mg total) by mouth daily. 30 tablet 1   pramipexole (MIRAPEX) 0.5 MG tablet Take 0.5 mg by mouth at bedtime.     QUEtiapine  (SEROQUEL  XR) 400 MG 24 hr tablet Take 1 tablet (400 mg total) by mouth at bedtime. 30 tablet 1   rOPINIRole (REQUIP XL) 2 MG 24 hr tablet Take by mouth.     sucralfate  (CARAFATE ) 1 g tablet Take 1 tablet (1 g total) by mouth 3 (three) times daily before meals. Dissolve tablet in 4 table spoons of warm water  swish and swallow 30 minutes before meals. (Patient not taking: Reported on 12/06/2024) 90 tablet 0   tiotropium (SPIRIVA) 18 MCG inhalation capsule Place 18 mcg into inhaler and inhale daily.     zolpidem  (AMBIEN ) 10 MG tablet Take 1 tablet (10 mg total) by mouth at bedtime as needed for sleep. 30 tablet 1   No current facility-administered medications for this visit.   Facility-Administered Medications Ordered in Other Visits  Medication Dose Route Frequency Provider Last Rate Last Admin   0.9 %  sodium chloride  infusion   Intravenous Continuous Hamdi Kley J, MD 10 mL/hr at 12/26/24 1013 New Bag at 12/26/24 1013    dexamethasone  (DECADRON ) injection 10 mg  10 mg Intravenous Once Brissa Asante J, MD       dextrose  5 % solution   Intravenous Continuous Jacobo, Evalene PARAS, MD       DOCEtaxel  (TAXOTERE ) 121 mg in sodium chloride  0.9 % 250 mL chemo infusion  50 mg/m2 (Treatment Plan Recorded) Intravenous Once Deaisha Welborn J, MD       durvalumab  (IMFINZI ) 1,500 mg in sodium chloride  0.9 % 100 mL chemo infusion  1,500 mg Intravenous Once Kamron Portee J, MD       fluorouracil  (ADRUCIL ) 6,300 mg in sodium chloride  0.9 % 124 mL chemo infusion  2,600 mg/m2 (Treatment Plan Recorded) Intravenous 1 day or 1 dose Joanne Salah J, MD       leucovorin  484 mg in dextrose  5 % 250 mL infusion  200 mg/m2 (Treatment Plan Recorded) Intravenous Once Virginio Isidore J, MD       magnesium  sulfate IVPB 4 g 100 mL  4 g Intravenous Once Jeanann Balinski J, MD 50 mL/hr at 12/26/24 1023 4 g at 12/26/24 1023   oxaliplatin  (ELOXATIN ) 200 mg in dextrose  5 % 500 mL chemo infusion  85 mg/m2 (Treatment Plan Recorded) Intravenous Once Kristi Hyer J, MD       palonosetron  (ALOXI ) injection 0.25 mg  0.25 mg Intravenous Once Aasia Peavler J, MD        OBJECTIVE: Vitals:   12/26/24 0844  BP: 99/66  Pulse: 84  Resp: 18  Temp: (!) 97.2 F (36.2 C)  SpO2: 96%      Body mass index is 33.12 kg/m.    ECOG FS:1 - Symptomatic but completely ambulatory  General: Well-developed, well-nourished, no acute distress. Eyes: Pink conjunctiva, anicteric sclera. HEENT: Normocephalic, moist mucous membranes. Lungs: No audible wheezing or coughing. Heart: Regular rate and rhythm. Abdomen: Soft, nontender, no obvious distention. Musculoskeletal: No edema, cyanosis, or clubbing. Neuro: Alert, answering all questions appropriately. Cranial nerves grossly intact. Skin: No rashes or petechiae noted. Psych: Normal affect.  LAB RESULTS:  Lab Results  Component Value Date   NA 136 12/26/2024   K 3.7 12/26/2024   CL 102  12/26/2024   CO2 24 12/26/2024   GLUCOSE 125 (  H) 12/26/2024   BUN 11 12/26/2024   CREATININE 0.96 12/26/2024   CALCIUM  7.9 (L) 12/26/2024   PROT 6.5 12/26/2024   ALBUMIN 4.1 12/26/2024   AST 22 12/26/2024   ALT 23 12/26/2024   ALKPHOS 136 (H) 12/26/2024   BILITOT 0.2 12/26/2024   GFRNONAA >60 12/26/2024   GFRAA >60 09/05/2020    Lab Results  Component Value Date   WBC 20.4 (H) 12/26/2024   NEUTROABS 16.2 (H) 12/26/2024   HGB 12.5 (L) 12/26/2024   HCT 37.4 (L) 12/26/2024   MCV 92.3 12/26/2024   PLT 126 (L) 12/26/2024     STUDIES: No results found.   ONCOLOGY HISTORY: Confirmed by EUS biopsy at [facility] on June 21, 2024.  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  Unclear if this is recurrent disease or a second primary.  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  Despite recommendation, patient declined surgery or any other treatments at that time.  He had been monitored at [facility] with frequent endoscopies without evidence of recurrent or progression of disease.  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  There is also central calcification reported.  This was not present at his most recent imaging 7 years ago in 2018.  PET scan results from May 27, 2024 confirmed hypermetabolism of lesion, but no other evidence of disease.  Biopsy confirmed malignancy.  After a lengthy discussion with the patient and his daughters, he wishes to proceed with treatment this time around and the recommended weekly carboplatin  and Taxol  along with concurrent XRT.  Patient completed treatment on September 22, 2024.   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  PLAN:    Poorly differentiated  adenocarcinoma of the lower third esophagus, HER2 negative: See oncology history as above.  Patient was evaluated by [facility] surgical oncology and recommendation is to pursue additional neoadjuvant chemotherapy with FLOT + durvalumab  with G-CSF support every 2 weeks for an additional 4-6 cycles.  Patient has now had port placement.  Proceed with cycle 2, day 1 of treatment today.  Return to clinic in 2 weeks for further evaluation and consideration of cycle 2, day 15.  Will consider reimaging after cycle 3. Headaches: Patient does not complain of this today.  He was previously instructed to increase his fluid intake and take Tylenol  intermittently with ibuprofen . Dysphagia: He does not complain of this today.  Continue Carafate  and Magic mouthwash as needed. Diarrhea: Continue Imodium as needed. Hypomagnesia: Patient's magnesium  is 1.1 today.  Patient now has 4 g IV magnesium  added to his treatment plan.   Leukocytosis: Likely from G-CSF support.  Monitor. Anemia: Mild.  Patient's hemoglobin is 12.5. Thrombocytopenia: Mild.  Patient's platelet count is 126.  Proceed with treatment as above.   Patient expressed understanding and was in agreement with this plan. He also understands that He can call clinic at any time with any questions, concerns, or complaints.    Cancer Staging  Esophageal cancer [facility]) Staging form: Esophagus - Adenocarcinoma, AJCC 8th Edition - Clinical stage from 06/27/2024: Stage Unknown (cTX, cN0, cM0, G3) - Signed by Jacobo Evalene PARAS, MD on 06/27/2024 Stage prefix: Initial diagnosis Total positive nodes: 0 Histologic grading system: 3 grade system   Evalene PARAS Jacobo,  MD   12/26/2024 10:40 AM     "

## 2024-12-27 ENCOUNTER — Inpatient Hospital Stay: Payer: MEDICAID

## 2024-12-27 VITALS — BP 128/58 | HR 93 | Temp 97.8°F | Resp 19

## 2024-12-27 DIAGNOSIS — C155 Malignant neoplasm of lower third of esophagus: Secondary | ICD-10-CM

## 2024-12-27 DIAGNOSIS — Z5112 Encounter for antineoplastic immunotherapy: Secondary | ICD-10-CM | POA: Diagnosis not present

## 2024-12-27 MED ORDER — PEGFILGRASTIM-CBQV 6 MG/0.6ML ~~LOC~~ SOSY
6.0000 mg | PREFILLED_SYRINGE | Freq: Once | SUBCUTANEOUS | Status: AC
  Administered 2024-12-27: 6 mg via SUBCUTANEOUS
  Filled 2024-12-27: qty 0.6

## 2024-12-28 ENCOUNTER — Inpatient Hospital Stay: Payer: MEDICAID

## 2025-01-06 ENCOUNTER — Inpatient Hospital Stay: Payer: MEDICAID | Admitting: Nurse Practitioner

## 2025-01-06 ENCOUNTER — Telehealth: Payer: Self-pay

## 2025-01-06 ENCOUNTER — Other Ambulatory Visit: Payer: Self-pay | Admitting: Oncology

## 2025-01-06 DIAGNOSIS — K117 Disturbances of salivary secretion: Secondary | ICD-10-CM | POA: Diagnosis not present

## 2025-01-06 DIAGNOSIS — T50905A Adverse effect of unspecified drugs, medicaments and biological substances, initial encounter: Secondary | ICD-10-CM

## 2025-01-06 DIAGNOSIS — C155 Malignant neoplasm of lower third of esophagus: Secondary | ICD-10-CM

## 2025-01-06 MED ORDER — SALINE SPRAY 0.65 % NA SOLN: 2.0000 | NASAL | 3 refills | Status: AC | PRN

## 2025-01-06 MED ORDER — CEVIMELINE HCL 30 MG PO CAPS: 30.0000 mg | ORAL_CAPSULE | Freq: Three times a day (TID) | ORAL | 2 refills | Status: AC

## 2025-01-06 NOTE — Progress Notes (Signed)
 "  Virtual Visit Progress Note  Symptom Management Clinic  [facility] at [facility] A Department of the [place]. [facility] [address] [place], [place] [address] [phone] (phone) [phone] (fax)  I connected with Yonatan Guitron on 01/06/25 at 10:45 AM EST by telephone visit and verified that I am speaking with the correct person using two identifiers.   I discussed the limitations, risks, security and privacy concerns of performing an evaluation and management service by telemedicine and the availability of in-person appointments. I also discussed with the patient that there may be a patient responsible charge related to this service. The patient expressed understanding and agreed to proceed.   Other persons participating in the visit and their role in the encounter: None  Patients location: Clinic Providers location: Home    Patient Care Team: [name], PA-C as PCP - General (Family Medicine) Jacobo Evalene PARAS, MD as Consulting Physician (Oncology) Lenn Aran, MD as Consulting Physician (Radiation Oncology)   Name of the patient: Ian [name]  [id number]  1961/11/09   Date of visit: 01/06/25  Diagnosis-esophageal cancer  Chief complaint/ Reason for visit-dry mouth  Heme/Onc history:  Oncology History  Esophageal cancer (HCC)  06/27/2024 Initial Diagnosis   Esophageal cancer (HCC)   06/27/2024 Cancer Staging   Staging form: Esophagus - Adenocarcinoma, AJCC 8th Edition - Clinical stage from 06/27/2024: Stage Unknown (cTX, cN0, cM0, G3) - Signed by Jacobo Evalene PARAS, MD on 06/27/2024 Stage prefix: Initial diagnosis Total positive nodes: 0 Histologic grading system: 3 grade system   08/15/2024 - 09/19/2024 Chemotherapy   Patient is on Treatment Plan : ESOPHAGUS Carboplatin  + Paclitaxel  Weekly X 6 Weeks with XRT     11/28/2024 -  Chemotherapy   Patient is on Treatment Plan : GASTROESOPHAGEAL Durvalumab   q28d + FLOT q14d / Durvalumab  q28d       Interval history-patient is a 64 year old male with above history of esophageal cancer currently receiving chemo and immunotherapy who agrees to telemedicine visit for complaints of dry mouth.  Symptoms presented after his second cycle of chemo and have persisted.  Says that his lips are cracking.  Mouth feels dry all the time.  When he blows his nose he sees spots of blood.  He is stopped wearing his oxygen due to worsening symptoms.  He was prescribed a humidifier by pulmonology a few days ago but he has not yet received it.  He is eating and drinking normally and says that he is otherwise tolerating treatment well without significant side effects.  He has tried sipping water  more frequently without improvement.  Review of systems- Review of Systems  Constitutional:  Negative for chills, fever and malaise/fatigue.  HENT:  Negative for congestion, nosebleeds, sinus pain and sore throat.   Respiratory:  Negative for cough, sputum production and shortness of breath.   Gastrointestinal:  Negative for heartburn and nausea.     Allergies[1]  Past Medical History:  Diagnosis Date   Anxiety    Barrett esophagus    Bipolar disorder (HCC)    Cancer (HCC)    Chronic hepatitis C (HCC)    COPD (chronic obstructive pulmonary disease) (HCC)    Coronary artery disease    Depression    Diabetes mellitus without complication (HCC)    Dyslipidemia    GERD (gastroesophageal reflux disease)    Hepatitis    Hypertension    Leukocytosis    Pre-diabetes    Primary malignant neoplasm (HCC)  Stroke [facility]) 2014   mini-stroke per patient    Past Surgical History:  Procedure Laterality Date   ANGIOPLASTY     APPENDECTOMY     BIOPSY  12/28/2023   Procedure: BIOPSY;  Surgeon: Onita Elspeth Sharper, DO;  Location: [facility] ENDOSCOPY;  Service: Gastroenterology;;   CARDIAC CATHETERIZATION     CHOLECYSTECTOMY     COLONOSCOPY WITH PROPOFOL  N/A 12/28/2023   Procedure:  COLONOSCOPY WITH PROPOFOL ;  Surgeon: Onita Elspeth Sharper, DO;  Location: [facility] ENDOSCOPY;  Service: Gastroenterology;  Laterality: N/A;   CORONARY ANGIOPLASTY     ESOPHAGOGASTRODUODENOSCOPY N/A 12/28/2023   Procedure: ESOPHAGOGASTRODUODENOSCOPY (EGD);  Surgeon: Onita Elspeth Sharper, DO;  Location: [facility] ENDOSCOPY;  Service: Gastroenterology;  Laterality: N/A;   HEMOSTASIS CLIP PLACEMENT  12/28/2023   Procedure: HEMOSTASIS CLIP PLACEMENT;  Surgeon: Onita Elspeth Sharper, DO;  Location: [facility] ENDOSCOPY;  Service: Gastroenterology;;   IR IMAGING GUIDED PORT INSERTION  11/23/2024   POLYPECTOMY  12/28/2023   Procedure: POLYPECTOMY;  Surgeon: Onita Elspeth Sharper, DO;  Location: [facility] ENDOSCOPY;  Service: Gastroenterology;;   WRIST SURGERY Right    age 38    Social History   Socioeconomic History   Marital status: Divorced    Spouse name: Not on file   Number of children: Not on file   Years of education: Not on file   Highest education level: Not on file  Occupational History   Not on file  Tobacco Use   Smoking status: Every Day    Current packs/day: 1.00    Types: Cigarettes   Smokeless tobacco: Never   Tobacco comments:    1 pack/day  Vaping Use   Vaping status: Never Used  Substance and Sexual Activity   Alcohol use: Not Currently   Drug use: Yes    Types: Marijuana    Comment: daily   Sexual activity: Not Currently  Other Topics Concern   Not on file  Social History Narrative   Live at home alone   Social Drivers of Health   Tobacco Use: High Risk (01/04/2025)   Received [facility]   Patient History    Smoking Tobacco Use: Every Day    Smokeless Tobacco Use: Never    Passive Exposure: Not on file  Financial Resource Strain: High Risk (09/21/2024)   Received [facility]   Overall Financial Resource Strain (CARDIA)    Difficulty of Paying Living Expenses: Hard  Food Insecurity: No Food Insecurity (10/12/2024)   Received from  [facility]   Epic    Within the past 12 months, you worried that your food would run out before you got the money to buy more.: Never true    Within the past 12 months, the food you bought just didn't last and you didn't have money to get more.: Never true  Recent Concern: Food Insecurity - Food Insecurity Present (09/21/2024)   Received from [facility]   Epic    Within the past 12 months, you worried that your food would run out before you got the money to buy more.: Often true    Within the past 12 months, the food you bought just didn't last and you didn't have money to get more.: Often true  Transportation Needs: No Transportation Needs (10/12/2024)   Received from [facility] - Transportation    Lack of Transportation (Medical): No    Lack of Transportation (Non-Medical): No  Physical Activity: Not on file  Stress: Not on file  Social Connections: Not on file  Intimate Partner Violence: Not At Risk (06/17/2024)   Received from [facility]   Epic    Within the last year, have you been afraid of your partner or ex-partner?: No    Within the last year, have you been humiliated or emotionally abused in other ways by your partner or ex-partner?: No    Within the last year, have you been kicked, hit, slapped, or otherwise physically hurt by your partner or ex-partner?: No    Within the last year, have you been raped or forced to have any kind of sexual activity by your partner or ex-partner?: No  Depression (PHQ2-9): Low Risk (12/12/2024)   Depression (PHQ2-9)    PHQ-2 Score: 1  Alcohol Screen: Not on file  Housing: Unknown (09/21/2024)   Received from [facility]   Epic    In the last 12 months, was there a time when you were not able to pay the mortgage or rent on time?: No    Number of Times Moved in the Last Year: Not on file    Homeless in the Last Year: Not on file  Utilities: Low Risk (10/12/2024)   Received from [facility]   Utilities    Within the past 12 months, have you been unable to get utilities(heat, electricity) when it was really needed?: No  Health Literacy: Not on file   Current Medications[2]  Physical exam: Exam limited due to telemedicine  There were no vitals filed for this visit. Physical Exam Constitutional:      Appearance: He is not ill-appearing.  Pulmonary:     Effort: No respiratory distress.  Neurological:     Mental Status: He is alert and oriented to person, place, and time.      Assessment and plan- Patient is a 64 y.o. male diagnosed with esophageal cancer, currently s/p cycle 2 of FLOT chemotherapy + durvalumab  with Dr Jacobo on 12/26/24. He presents to symptom management clinic for evaluation of dry mouth.    1) Dry Mouth/Xerostomia- likely secondary to known malignancy and chemotherapy. Recommend oral hygiene twice a day, mechanical/gustatory stimulation, artificial saliva and we can trial pilocarpine. Worsened by oxygen use.  Recommend saline nasal spray as needed. Add vaseline to lock in moisture on lips. Recommend cevimeline  30 mg three times a day as cholinergic agonist. Recommend he reach out to his pulmonologist to get a humidifier for his oxygen.   Follow up with Dr FInnegan as scheduled. RTC as needed in interim.   Visit Diagnosis 1. Drug-induced xerostomia    Patient expressed understanding and was in agreement with this plan. He also understands that He can call clinic at any time with any questions, concerns, or complaints.   I discussed the assessment and treatment plan with the patient. The patient was provided an opportunity to ask questions and all were answered. The patient agreed with the plan and demonstrated an understanding of the instructions.   The patient was advised to call back or seek an in-person evaluation if the symptoms worsen or if the condition fails to improve as anticipated.   I spent 15 minutes on this telephone encounter.    Thank you for allowing me to participate in the care of this very pleasant patient.   Tinnie Dawn, DNP, AGNP-C [facility]    [1]  Allergies Allergen Reactions   Fluoxetine Swelling    Anxiety, itching  Hydrocodone -Acetaminophen  Itching   Mirtazapine  Other (See Comments)    nightmares   Hydrochlorothiazide  Other (See Comments)    LEG CRAMPING HYPOMAGNESEMIA   Tramadol Other (See Comments)   Gabapentin  Other (See Comments)    Nightmares   [2]  Current Outpatient Medications:    albuterol  (VENTOLIN  HFA) 108 (90 Base) MCG/ACT inhaler, Inhale 2 puffs into the lungs every 6 (six) hours as needed for wheezing or shortness of breath., Disp: 18 g, Rfl: 1   alum & mag hydroxide-simeth (MAALOX PLUS) 400-400-40 MG/5ML suspension, Take 10 mLs by mouth every 6 (six) hours as needed for indigestion., Disp: 355 mL, Rfl: 0   amLODipine  (NORVASC ) 10 MG tablet, Take 10 mg by mouth daily., Disp: , Rfl:    aspirin  81 MG chewable tablet, Chew 1 tablet (81 mg total) by mouth daily., Disp: 30 tablet, Rfl: 1   atorvastatin  (LIPITOR) 40 MG tablet, Take 1 tablet (40 mg total) by mouth daily., Disp: 30 tablet, Rfl: 1   busPIRone  (BUSPAR ) 10 MG tablet, Take 2 tablets (20 mg total) by mouth 3 (three) times daily., Disp: 180 tablet, Rfl: 1   cevimeline  (EVOXAC ) 30 MG capsule, Take 1 capsule (30 mg total) by mouth every 8 (eight) hours. For dry mouth, Disp: 90 capsule, Rfl: 2   clonazePAM  (KLONOPIN ) 0.5 MG tablet, Take 1 tablet (0.5 mg total) by mouth 2 (two) times daily as needed (Anxiety)., Disp: 60 tablet, Rfl: 1   cyclobenzaprine  (FLEXERIL ) 10 MG tablet, Take 1 tablet (10 mg total) by mouth 2 (two) times daily as needed for muscle spasms., Disp: 20 tablet, Rfl: 0   escitalopram  (LEXAPRO ) 20 MG tablet, Take 1 tablet (20 mg total) by mouth daily., Disp: 30 tablet, Rfl: 1   famotidine  (PEPCID ) 20 MG tablet, Take 20 mg by mouth 2 (two) times daily., Disp: , Rfl:     fluticasone-salmeterol (ADVAIR) 500-50 MCG/ACT AEPB, Inhale 1 puff into the lungs in the morning and at bedtime., Disp: , Rfl:    hydrOXYzine  (ATARAX /VISTARIL ) 25 MG tablet, Take 1 tablet (25 mg total) by mouth 3 (three) times daily as needed for anxiety., Disp: 90 tablet, Rfl: 1   ibuprofen  (ADVIL ) 600 MG tablet, Take 600 mg by mouth 3 (three) times daily., Disp: , Rfl:    lidocaine -prilocaine  (EMLA ) cream, Apply to affected area once, Disp: 30 g, Rfl: 3   lisinopril  (ZESTRIL ) 20 MG tablet, Take 1 tablet (20 mg total) by mouth daily., Disp: 30 tablet, Rfl: 1   magnesium  oxide (MAG-OX) 400 (240 Mg) MG tablet, Take 400 mg by mouth 2 (two) times daily. TAKE 4 TABLETS BY MOUTH 2 TIMES DAILY, Disp: , Rfl:    metFORMIN  (GLUCOPHAGE ) 1000 MG tablet, Take 1 tablet (1,000 mg total) by mouth 2 (two) times daily with a meal., Disp: 60 tablet, Rfl: 1   mometasone -formoterol  (DULERA ) 200-5 MCG/ACT AERO, Inhale 2 puffs into the lungs 2 (two) times daily., Disp: 1 each, Rfl: 1   pantoprazole  (PROTONIX ) 40 MG tablet, Take 1 tablet (40 mg total) by mouth daily., Disp: 30 tablet, Rfl: 1   pramipexole (MIRAPEX) 0.5 MG tablet, Take 0.5 mg by mouth at bedtime., Disp: , Rfl:    QUEtiapine  (SEROQUEL  XR) 400 MG 24 hr tablet, Take 1 tablet (400 mg total) by mouth at bedtime., Disp: 30 tablet, Rfl: 1   rOPINIRole (REQUIP XL) 2 MG 24 hr tablet, Take by mouth., Disp: , Rfl:    sodium chloride  (OCEAN) 0.65 % SOLN nasal spray, Place 2 sprays into  both nostrils as needed (dry nose)., Disp: 50 mL, Rfl: 3   tiotropium (SPIRIVA) 18 MCG inhalation capsule, Place 18 mcg into inhaler and inhale daily., Disp: , Rfl:    zolpidem  (AMBIEN ) 10 MG tablet, Take 1 tablet (10 mg total) by mouth at bedtime as needed for sleep., Disp: 30 tablet, Rfl: 1   magic mouthwash (multi-ingredient) oral suspension, Swish and swallow 5-10 mLs 4 (four) times daily as needed. (Patient not taking: Reported on 01/06/2025), Disp: 480 mL, Rfl: 3   sucralfate   (CARAFATE ) 1 g tablet, Take 1 tablet (1 g total) by mouth 3 (three) times daily before meals. Dissolve tablet in 4 table spoons of warm water  swish and swallow 30 minutes before meals. (Patient not taking: Reported on 01/06/2025), Disp: 90 tablet, Rfl: 0  "

## 2025-01-06 NOTE — Telephone Encounter (Signed)
 Patient concerned with continued/worsened dryness of mouth, nose, and lips.  Hasn't dried any OTC remedies.  Reports drinking plenty of water .  No fever or cold/flu like symptoms.  Nasal drainage is continuous with blood tinge with blowing.  Does have a sore in right nostril that is sore with touch.    Wants to know what is recommended to help alleviate these symptoms.

## 2025-01-09 ENCOUNTER — Inpatient Hospital Stay: Payer: MEDICAID

## 2025-01-09 ENCOUNTER — Inpatient Hospital Stay: Payer: MEDICAID | Admitting: Oncology

## 2025-01-09 ENCOUNTER — Encounter: Payer: Self-pay | Admitting: Oncology

## 2025-01-09 VITALS — BP 115/69 | HR 78

## 2025-01-09 VITALS — BP 102/66 | HR 85 | Temp 97.8°F | Resp 18 | Ht 73.0 in | Wt 249.0 lb

## 2025-01-09 DIAGNOSIS — C155 Malignant neoplasm of lower third of esophagus: Secondary | ICD-10-CM

## 2025-01-09 DIAGNOSIS — Z5112 Encounter for antineoplastic immunotherapy: Secondary | ICD-10-CM | POA: Diagnosis not present

## 2025-01-09 LAB — CBC WITH DIFFERENTIAL (CANCER CENTER ONLY)
Abs Immature Granulocytes: 0.9 K/uL — ABNORMAL HIGH (ref 0.00–0.07)
Basophils Absolute: 0.1 K/uL (ref 0.0–0.1)
Basophils Relative: 1 %
Eosinophils Absolute: 0.2 K/uL (ref 0.0–0.5)
Eosinophils Relative: 1 %
HCT: 37.5 % — ABNORMAL LOW (ref 39.0–52.0)
Hemoglobin: 12.4 g/dL — ABNORMAL LOW (ref 13.0–17.0)
Immature Granulocytes: 6 %
Lymphocytes Relative: 8 %
Lymphs Abs: 1.3 K/uL (ref 0.7–4.0)
MCH: 31 pg (ref 26.0–34.0)
MCHC: 33.1 g/dL (ref 30.0–36.0)
MCV: 93.8 fL (ref 80.0–100.0)
Monocytes Absolute: 1.1 K/uL — ABNORMAL HIGH (ref 0.1–1.0)
Monocytes Relative: 7 %
Neutro Abs: 12.4 K/uL — ABNORMAL HIGH (ref 1.7–7.7)
Neutrophils Relative %: 77 %
Platelet Count: 100 K/uL — ABNORMAL LOW (ref 150–400)
RBC: 4 MIL/uL — ABNORMAL LOW (ref 4.22–5.81)
RDW: 13.7 % (ref 11.5–15.5)
WBC Count: 16 K/uL — ABNORMAL HIGH (ref 4.0–10.5)
nRBC: 0.1 % (ref 0.0–0.2)

## 2025-01-09 LAB — CMP (CANCER CENTER ONLY)
ALT: 29 U/L (ref 0–44)
AST: 28 U/L (ref 15–41)
Albumin: 4 g/dL (ref 3.5–5.0)
Alkaline Phosphatase: 131 U/L — ABNORMAL HIGH (ref 38–126)
Anion gap: 10 (ref 5–15)
BUN: 13 mg/dL (ref 8–23)
CO2: 22 mmol/L (ref 22–32)
Calcium: 8.9 mg/dL (ref 8.9–10.3)
Chloride: 103 mmol/L (ref 98–111)
Creatinine: 0.83 mg/dL (ref 0.61–1.24)
GFR, Estimated: >60 mL/min
Glucose, Bld: 106 mg/dL — ABNORMAL HIGH (ref 70–99)
Potassium: 4.5 mmol/L (ref 3.5–5.1)
Sodium: 135 mmol/L (ref 135–145)
Total Bilirubin: <0.2 mg/dL (ref 0.0–1.2)
Total Protein: 6.4 g/dL — ABNORMAL LOW (ref 6.5–8.1)

## 2025-01-09 LAB — MAGNESIUM: Magnesium: 1.3 mg/dL — ABNORMAL LOW (ref 1.7–2.4)

## 2025-01-09 MED ORDER — SODIUM CHLORIDE 0.9% FLUSH
10.0000 mL | Freq: Once | INTRAVENOUS | Status: AC
  Administered 2025-01-09: 10 mL via INTRAVENOUS
  Filled 2025-01-09: qty 10

## 2025-01-09 MED ORDER — SODIUM CHLORIDE 0.9 % IV SOLN
2600.0000 mg/m2 | INTRAVENOUS | Status: DC
  Administered 2025-01-09: 6300 mg via INTRAVENOUS
  Filled 2025-01-09: qty 126

## 2025-01-09 MED ORDER — DEXAMETHASONE SOD PHOSPHATE PF 10 MG/ML IJ SOLN
10.0000 mg | Freq: Once | INTRAMUSCULAR | Status: AC
  Administered 2025-01-09: 10 mg via INTRAVENOUS
  Filled 2025-01-09: qty 1

## 2025-01-09 MED ORDER — LEUCOVORIN CALCIUM INJECTION 350 MG
200.0000 mg/m2 | Freq: Once | INTRAVENOUS | Status: AC
  Administered 2025-01-09: 484 mg via INTRAVENOUS
  Filled 2025-01-09: qty 24.2

## 2025-01-09 MED ORDER — PALONOSETRON HCL INJECTION 0.25 MG/5ML
0.2500 mg | Freq: Once | INTRAVENOUS | Status: AC
  Administered 2025-01-09: 0.25 mg via INTRAVENOUS
  Filled 2025-01-09: qty 5

## 2025-01-09 MED ORDER — MAGNESIUM SULFATE 4 GM/100ML IV SOLN
4.0000 g | Freq: Once | INTRAVENOUS | Status: AC
  Administered 2025-01-09: 4 g via INTRAVENOUS
  Filled 2025-01-09: qty 100

## 2025-01-09 MED ORDER — SODIUM CHLORIDE 0.9 % IV SOLN
INTRAVENOUS | Status: DC
  Filled 2025-01-09: qty 250

## 2025-01-09 MED ORDER — OXALIPLATIN CHEMO INJECTION 100 MG/20ML
85.0000 mg/m2 | Freq: Once | INTRAVENOUS | Status: AC
  Administered 2025-01-09: 200 mg via INTRAVENOUS
  Filled 2025-01-09: qty 40

## 2025-01-09 MED ORDER — SODIUM CHLORIDE 0.9 % IV SOLN
50.0000 mg/m2 | Freq: Once | INTRAVENOUS | Status: AC
  Administered 2025-01-09: 121 mg via INTRAVENOUS
  Filled 2025-01-09: qty 12.1

## 2025-01-09 MED ORDER — DEXTROSE 5 % IV SOLN
INTRAVENOUS | Status: DC
  Filled 2025-01-09: qty 250

## 2025-01-09 NOTE — Progress Notes (Signed)
 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  INTERVAL HISTORY: Patient returns to clinic today for further evaluation and consideration of cycle 2, day 15 of FLOT + durvalumab .  He continues to have chronic weakness and fatigue, but otherwise feels well.  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  He does not complain of pain or dysphagia today. He denies any recent fevers or illnesses.  He has a good appetite and denies weight loss.  He has no chest pain, shortness of breath, cough, or hemoptysis.  He denies any nausea, vomiting, constipation, or diarrhea.  He has no urinary complaints.  Patient offers no further specific complaints today.  REVIEW OF SYSTEMS:   Review of Systems  Constitutional:  Positive for malaise/fatigue. Negative for fever and weight loss.  HENT:  Negative for sore throat.   Respiratory: Negative.  Negative for cough, hemoptysis and shortness of breath.   Cardiovascular: Negative.  Negative for chest pain and leg swelling.  Gastrointestinal: Negative.  Negative for abdominal pain.  Genitourinary: Negative.  Negative for dysuria.  Musculoskeletal: Negative.  Negative for back pain.  Skin: Negative.  Negative for rash.  Neurological: Negative.  Negative for dizziness, focal weakness, weakness and headaches.  Psychiatric/Behavioral: Negative.  The patient is not nervous/anxious.     As per HPI. Otherwise, a complete review of systems is negative.  PAST MEDICAL HISTORY: Past Medical History:  Diagnosis Date   Anxiety    Barrett  esophagus    Bipolar disorder (HCC)    Cancer (HCC)    Chronic hepatitis C (HCC)    COPD (chronic obstructive pulmonary disease) (HCC)    Coronary artery disease    Depression    Diabetes mellitus without complication (HCC)    Dyslipidemia    GERD (gastroesophageal reflux disease)    Hepatitis    Hypertension    Leukocytosis    Pre-diabetes    Primary malignant neoplasm (HCC)    Stroke (HCC) 2014   mini-stroke per patient    PAST SURGICAL HISTORY: Past Surgical History:  Procedure Laterality Date   ANGIOPLASTY     APPENDECTOMY     BIOPSY  12/28/2023   Procedure: BIOPSY;  Surgeon: Onita Elspeth Sharper, DO;  Location: [facility] ENDOSCOPY;  Service: Gastroenterology;;   CARDIAC CATHETERIZATION     CHOLECYSTECTOMY     COLONOSCOPY WITH PROPOFOL  N/A 12/28/2023   Procedure: COLONOSCOPY WITH PROPOFOL ;  Surgeon: Onita Elspeth Sharper, DO;  Location: [facility] ENDOSCOPY;  Service: Gastroenterology;  Laterality: N/A;   CORONARY ANGIOPLASTY     ESOPHAGOGASTRODUODENOSCOPY N/A 12/28/2023   Procedure: ESOPHAGOGASTRODUODENOSCOPY (EGD);  Surgeon: Onita Elspeth Sharper, DO;  Location: [facility] ENDOSCOPY;  Service: Gastroenterology;  Laterality: N/A;   HEMOSTASIS CLIP PLACEMENT  12/28/2023   Procedure: HEMOSTASIS CLIP PLACEMENT;  Surgeon: Onita Elspeth Sharper, DO;  Location: [facility] ENDOSCOPY;  Service: Gastroenterology;;   IR IMAGING GUIDED PORT INSERTION  11/23/2024   POLYPECTOMY  12/28/2023   Procedure: POLYPECTOMY;  Surgeon: Onita Elspeth Sharper, DO;  Location: [facility] ENDOSCOPY;  Service: Gastroenterology;;   WRIST SURGERY Right    age 24    FAMILY HISTORY: Family History  Problem Relation Age of  Onset   Stroke Father    Leukemia Father    Breast cancer Sister     ADVANCED DIRECTIVES (Y/N):  N  HEALTH MAINTENANCE: Social History   Tobacco Use   Smoking status: Every Day    Current packs/day: 1.00    Types: Cigarettes   Smokeless tobacco: Never   Tobacco comments:    1 pack/day  Vaping Use    Vaping status: Never Used  Substance Use Topics   Alcohol use: Not Currently   Drug use: Yes    Types: Marijuana    Comment: daily     Colonoscopy:  PAP:  Bone density:  Lipid panel:  Allergies  Allergen Reactions   Fluoxetine Swelling    Anxiety, itching   Hydrocodone -Acetaminophen  Itching   Mirtazapine  Other (See Comments)    nightmares   Hydrochlorothiazide  Other (See Comments)    LEG CRAMPING HYPOMAGNESEMIA   Tramadol Other (See Comments)   Gabapentin  Other (See Comments)    Nightmares     Current Outpatient Medications  Medication Sig Dispense Refill   albuterol  (VENTOLIN  HFA) 108 (90 Base) MCG/ACT inhaler Inhale 2 puffs into the lungs every 6 (six) hours as needed for wheezing or shortness of breath. 18 g 1   alum & mag hydroxide-simeth (MAALOX PLUS) 400-400-40 MG/5ML suspension Take 10 mLs by mouth every 6 (six) hours as needed for indigestion. 355 mL 0   amLODipine  (NORVASC ) 10 MG tablet Take 10 mg by mouth daily.     aspirin  81 MG chewable tablet Chew 1 tablet (81 mg total) by mouth daily. 30 tablet 1   atorvastatin  (LIPITOR) 40 MG tablet Take 1 tablet (40 mg total) by mouth daily. 30 tablet 1   busPIRone  (BUSPAR ) 10 MG tablet Take 2 tablets (20 mg total) by mouth 3 (three) times daily. 180 tablet 1   clonazePAM  (KLONOPIN ) 0.5 MG tablet Take 1 tablet (0.5 mg total) by mouth 2 (two) times daily as needed (Anxiety). 60 tablet 1   cyclobenzaprine  (FLEXERIL ) 10 MG tablet Take 1 tablet (10 mg total) by mouth 2 (two) times daily as needed for muscle spasms. 20 tablet 0   escitalopram  (LEXAPRO ) 20 MG tablet Take 1 tablet (20 mg total) by mouth daily. 30 tablet 1   famotidine  (PEPCID ) 20 MG tablet Take 20 mg by mouth 2 (two) times daily.     fluticasone-salmeterol (ADVAIR) 500-50 MCG/ACT AEPB Inhale 1 puff into the lungs in the morning and at bedtime.     hydrOXYzine  (ATARAX /VISTARIL ) 25 MG tablet Take 1 tablet (25 mg total) by mouth 3 (three) times daily as needed for  anxiety. 90 tablet 1   ibuprofen  (ADVIL ) 600 MG tablet Take 600 mg by mouth 3 (three) times daily.     lidocaine -prilocaine  (EMLA ) cream Apply to affected area once 30 g 3   lisinopril  (ZESTRIL ) 20 MG tablet Take 1 tablet (20 mg total) by mouth daily. 30 tablet 1   magic mouthwash (multi-ingredient) oral suspension Swish and swallow 5-10 mLs 4 (four) times daily as needed. 480 mL 3   magnesium  oxide (MAG-OX) 400 (240 Mg) MG tablet Take 400 mg by mouth 2 (two) times daily. TAKE 4 TABLETS BY MOUTH 2 TIMES DAILY     metFORMIN  (GLUCOPHAGE ) 1000 MG tablet Take 1 tablet (1,000 mg total) by mouth 2 (two) times daily with a meal. 60 tablet 1   mometasone -formoterol  (DULERA ) 200-5 MCG/ACT AERO Inhale 2 puffs into the lungs 2 (two) times daily. 1 each 1   pantoprazole  (PROTONIX )  40 MG tablet Take 1 tablet (40 mg total) by mouth daily. 30 tablet 1   pramipexole (MIRAPEX) 0.5 MG tablet Take 0.5 mg by mouth at bedtime.     QUEtiapine  (SEROQUEL  XR) 400 MG 24 hr tablet Take 1 tablet (400 mg total) by mouth at bedtime. 30 tablet 1   rOPINIRole (REQUIP XL) 2 MG 24 hr tablet Take by mouth.     sodium chloride  (OCEAN) 0.65 % SOLN nasal spray Place 2 sprays into both nostrils as needed (dry nose). 50 mL 3   sucralfate  (CARAFATE ) 1 g tablet Take 1 tablet (1 g total) by mouth 3 (three) times daily before meals. Dissolve tablet in 4 table spoons of warm water  swish and swallow 30 minutes before meals. 90 tablet 0   tiotropium (SPIRIVA) 18 MCG inhalation capsule Place 18 mcg into inhaler and inhale daily.     zolpidem  (AMBIEN ) 10 MG tablet Take 1 tablet (10 mg total) by mouth at bedtime as needed for sleep. 30 tablet 1   cevimeline  (EVOXAC ) 30 MG capsule Take 1 capsule (30 mg total) by mouth every 8 (eight) hours. For dry mouth (Patient not taking: Reported on 01/09/2025) 90 capsule 2   No current facility-administered medications for this visit.   Facility-Administered Medications Ordered in Other Visits  Medication  Dose Route Frequency Provider Last Rate Last Admin   0.9 %  sodium chloride  infusion   Intravenous Continuous Nadie Fiumara J, MD 10 mL/hr at 01/09/25 0955 New Bag at 01/09/25 0955   dexamethasone  (DECADRON ) injection 10 mg  10 mg Intravenous Once Soloman Mckeithan J, MD       dextrose  5 % solution   Intravenous Continuous Jacobo Evalene PARAS, MD       DOCEtaxel  (TAXOTERE ) 121 mg in sodium chloride  0.9 % 250 mL chemo infusion  50 mg/m2 (Treatment Plan Recorded) Intravenous Once Berk Pilot J, MD       fluorouracil  (ADRUCIL ) 6,300 mg in sodium chloride  0.9 % 124 mL chemo infusion  2,600 mg/m2 (Treatment Plan Recorded) Intravenous 1 day or 1 dose Amity Roes J, MD       leucovorin  484 mg in dextrose  5 % 250 mL infusion  200 mg/m2 (Treatment Plan Recorded) Intravenous Once Dwane Andres J, MD       magnesium  sulfate IVPB 4 g 100 mL  4 g Intravenous Once Mercadies Co J, MD 50 mL/hr at 01/09/25 0957 4 g at 01/09/25 0957   oxaliplatin  (ELOXATIN ) 200 mg in dextrose  5 % 500 mL chemo infusion  85 mg/m2 (Treatment Plan Recorded) Intravenous Once Deklin Bieler J, MD       palonosetron  (ALOXI ) injection 0.25 mg  0.25 mg Intravenous Once Jacobo Evalene PARAS, MD        OBJECTIVE: Vitals:   01/09/25 0903  BP: 102/66  Pulse: 85  Resp: 18  Temp: 97.8 F (36.6 C)  SpO2: 97%       Body mass index is 32.85 kg/m.    ECOG FS:1 - Symptomatic but completely ambulatory  General: Well-developed, well-nourished, no acute distress. Eyes: Pink conjunctiva, anicteric sclera. HEENT: Normocephalic, moist mucous membranes. Lungs: No audible wheezing or coughing. Heart: Regular rate and rhythm. Abdomen: Soft, nontender, no obvious distention. Musculoskeletal: No edema, cyanosis, or clubbing. Neuro: Alert, answering all questions appropriately. Cranial nerves grossly intact. Skin: No rashes or petechiae noted. Psych: Normal affect.  LAB RESULTS:  Lab Results  Component Value Date    NA 135 01/09/2025   K 4.5 01/09/2025  CL 103 01/09/2025   CO2 22 01/09/2025   GLUCOSE 106 (H) 01/09/2025   BUN 13 01/09/2025   CREATININE 0.83 01/09/2025   CALCIUM  8.9 01/09/2025   PROT 6.4 (L) 01/09/2025   ALBUMIN 4.0 01/09/2025   AST 28 01/09/2025   ALT 29 01/09/2025   ALKPHOS 131 (H) 01/09/2025   BILITOT <0.2 01/09/2025   GFRNONAA >60 01/09/2025   GFRAA >60 09/05/2020    Lab Results  Component Value Date   WBC 16.0 (H) 01/09/2025   NEUTROABS 12.4 (H) 01/09/2025   HGB 12.4 (L) 01/09/2025   HCT 37.5 (L) 01/09/2025   MCV 93.8 01/09/2025   PLT 100 (L) 01/09/2025     STUDIES: No results found.   ONCOLOGY HISTORY: Confirmed by EUS biopsy at [facility] on June 21, 2024.  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  Unclear if this is recurrent disease or a second primary.  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  Despite recommendation, patient declined surgery or any other treatments at that time.  He had been monitored at [facility] with frequent endoscopies without evidence of recurrent or progression of disease.  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  There is also central calcification reported.  This was not present at his most recent imaging 7 years ago in 2018.  PET scan results from May 27, 2024 confirmed hypermetabolism of lesion, but no other evidence of disease.  Biopsy confirmed malignancy.  After a lengthy discussion with the patient and his daughters, he wishes to proceed with treatment this time around and the recommended weekly carboplatin  and Taxol  along with concurrent XRT.  Patient completed treatment on September 22, 2024.   ASSESSMENT: Poorly differentiated adenocarcinoma of the lower third esophagus, HER2  negative.  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  Patient was evaluated by [facility] surgical oncology and recommendation is to pursue additional neoadjuvant chemotherapy with FLOT + durvalumab  with G-CSF support every 2 weeks for an additional 4-6 cycles.  Patient has now had port placement.  Proceed with cycle 2, day 15 of treatment today.  R return return to clinic tomorrow for pump removal and then in 2 weeks for further evaluation and consideration of cycle 3, day 1.  Consider reimaging after cycle 3. Headaches: Patient does not complain of this today.  He was previously instructed to increase his fluid intake and take Tylenol  intermittently with ibuprofen . Dysphagia: He does not complain of this today.  Continue Carafate  and Magic mouthwash as needed. Diarrhea: Patient does not complain of this today.  Continue Imodium as needed. Hypomagnesia: Patient's magnesium  is 1.3 today.  Continue oral magnesium  supplementation.  Patient now has 4 g IV magnesium  added to his treatment plan.   Leukocytosis: Chronic and unchanged.  Likely from G-CSF support.  Monitor. Anemia: Chronic and unchanged.  Patient's hemoglobin is 12.4.  Thrombocytopenia: Platelet count decreased to 100.  Proceed cautiously with treatment as above.  Patient expressed understanding and was in agreement with this plan. He also understands that He can call clinic at any time with any questions, concerns, or complaints.    Cancer Staging  Esophageal cancer [facility]) Staging form: Esophagus - Adenocarcinoma, AJCC 8th Edition - Clinical  stage from 06/27/2024: Stage Unknown (cTX, cN0, cM0, G3) - Signed by Jacobo Evalene PARAS, MD on 06/27/2024 Stage prefix: Initial diagnosis Total positive nodes: 0 Histologic grading system: 3 grade system   Evalene PARAS Jacobo, MD   01/09/2025 11:12 AM     "

## 2025-01-09 NOTE — Progress Notes (Signed)
 Patient is still having some issues with his nose and lips being dry to were when he blows his nose its dry blood. He is using the saline nasal spray as Lauren recommended.

## 2025-01-10 ENCOUNTER — Inpatient Hospital Stay: Payer: MEDICAID

## 2025-01-10 VITALS — BP 113/70 | HR 82 | Temp 97.2°F | Resp 18

## 2025-01-10 DIAGNOSIS — C155 Malignant neoplasm of lower third of esophagus: Secondary | ICD-10-CM

## 2025-01-10 DIAGNOSIS — Z5112 Encounter for antineoplastic immunotherapy: Secondary | ICD-10-CM | POA: Diagnosis not present

## 2025-01-10 MED ORDER — PEGFILGRASTIM-CBQV 6 MG/0.6ML ~~LOC~~ SOSY
6.0000 mg | PREFILLED_SYRINGE | Freq: Once | SUBCUTANEOUS | Status: AC
  Administered 2025-01-10: 6 mg via SUBCUTANEOUS

## 2025-01-11 ENCOUNTER — Inpatient Hospital Stay: Payer: MEDICAID

## 2025-01-18 ENCOUNTER — Other Ambulatory Visit: Payer: Self-pay

## 2025-01-19 ENCOUNTER — Telehealth: Payer: Self-pay | Admitting: *Deleted

## 2025-01-19 ENCOUNTER — Other Ambulatory Visit: Payer: Self-pay

## 2025-01-19 DIAGNOSIS — K117 Disturbances of salivary secretion: Secondary | ICD-10-CM

## 2025-01-19 DIAGNOSIS — K1231 Oral mucositis (ulcerative) due to antineoplastic therapy: Secondary | ICD-10-CM

## 2025-01-19 DIAGNOSIS — R040 Epistaxis: Secondary | ICD-10-CM

## 2025-01-19 DIAGNOSIS — C159 Malignant neoplasm of esophagus, unspecified: Secondary | ICD-10-CM

## 2025-01-19 NOTE — Telephone Encounter (Signed)
 " Caller verified using pt's full name and dob prior to discussing PHI  Patient requesting apt today or tom (if he can arrange for transportation) to be evaluated for epistaxis and xerostomia. Nosebleed often takes several mins to stop. Pt concerned that his plt count may have dropped. Last plt count 10 days ago was 100. He wears oxygen therapy. Reports that his he using afrin spray and room humidification as needed. Feels like this is drying out his nasal passages. He does not have any NS spray at this time. RN encouraged the use of NS spray and humidification at home. I will reach out to [facility] providers/Dr. Jacobo for further recommendations. Pt willing to come into today or tom if he can have his daughter transport him. He will call me back shortly to determine if he can get a ride.     NURSING SYMPTOM TRIAGE ASSESSMENT & DISPOSITION  Mode of interaction:  Telephone Date of symptom triage interaction:  01/19/25 Time of symptom triage interaction:  [id number]  Cancer diagnosis:  Malignant neoplasm of esophagus, unspecified location [facility]) Patient is on Treatment Plan :  GASTROESOPHAGEAL Durvalumab  q28d + FLOT q14d / Durvalumab  q28d  Last treatment date:  01/09/2025 Oral chemotherapy:  No  Symptom Triage Protocol:  Xerostomia (Dry Mouth)  History of the problem:  Xerostomia (Dry Mouth) Quality of saliva (thin & watery vs thick & ropy): Thick at times. Reports that his lips are cracking  Dryness and/or coating on lips, mucosa, or tongue: Yes: mouth  Degree of mucositis: Pain, Difficulty swallowing, Taste alterations, and Decreased oral intake  Difficulty articulating words (dysarthria): No  Disorder of the sense of taste (dysgeusia): Yes:    Burning or pain of oral mucosa or tongue:  No  Sensitivity of teeth and gums: Yes  Difficulty or pain with swallowing: Yes-occasional  Precipitating factors:   Onset: A month  Duration:   Relieving factors:   Use of saliva substitutes or salivary  stimulants: No  Ability to wear dentures and rating of comfort with eating: N/a     Additional commentary:      Symptom Triage Protocol:  Oral Mucositis  History of the problem:  Oral Mucositis Affected location: Lips  Symptoms: Pain, Difficulty swallowing, and Decreased oral intake  Current oral hygiene practices:   Tobacco and alcohol use? No  Relieving factors? Using apple juices as needed  Oral intake (or tube feedings): He is able to eat and drink but reports intermittently food gets stuck  Recent weight loss: No  Changes in ADLs: No     Additional commentary:       Triage Nurse Guidance Signs and Symptoms Action  Temperature higher than 100.95F (38C); chills with suspected neutropenia Seek emergency care. Generally, xerostomia is not an emergent condition.  Increased difficulty swallowing Oral assessment indicates increase in inflammation or presence of ulceration (white patches, con-fluent patches) Decreased urine output that is cloudy or dark  Dizziness, increased weakness, or fatigue  Increased difficulty swallowing Seek urgent care within 24 hours.  Oral assessment indicates dry lips and mucous membranes with thick secretions Difficulty swallowing  Follow homecare instructions. Notify MD if no improvement.     Patient Instruction  Disclaimer:  Patient specific and [employer] instruction provided verbally and sent through MyChart for active users.    Follow the nutrition care plan as developed by the RDN.  Try sucking on ice chips, sugar-free candy, frozen grapes, or flavored ice pops.  Avoid caffeine (e.g., coffee, tea, colas), alcohol, and tobacco.  Add fresh lemon or citrus to water .  Consume high-calorie/high-protein supplements.  Choose soft, moist foods with added sauces.  Avoid dry foods (e.g., tough meats, raw vegetables, breads, crackers, chips, pretzels). Carry a water  bottle throughout the day. Aim to drink 8-10 cups (approximately 2 L) of  caffeine-free fluids per day.  Keep sugar-free hard candies or sugar-free gum on hand.   Perform an oral cavity assessment daily.   Perform oral care after each meal, at bedtime, or as directed. Use a soft-bristle toothbrush; floss using waxed dental floss, if no pain and if platelets are adequate; and use alcohol-free mouth rinse.  Rinse frequently with a salt and baking soda solution to cut thick, ropy secretions and for basic oral hygiene.  Use oral care agents, saliva substitutes, and salivary stimulants as directed.   Use analgesics, anesthetics, and antibiotics as directed.   Maintain regular dental visits as directed.  Report the Following Problems: Oral assessment indicates an increase in inflammation or presence of ulceration (white patches, confluent patches) Dizziness, increased weakness, or fatigue  Decreased urine output that is cloudy or dark  Increased difficulty swallowing  Seek Emergency Care Immediately if Any of the Following Occurs: Temperature higher than 100.57F (38C); chills with suspected neutropenia  Teach Back Method used:  Yes   Triage Nurse Guidance Signs and Symptoms Action  Uncontrolled bleeding Difficulty breathing Temperature higher than 100.4 F or chills with suspected neutropenia Seek emergency care.  Call an ambulance immediately.  Severe ulceration and inability to take nutrition orally or swallow Bleeding from gums, oral cavity, or mouth (that does not stop within 5-10 minutes of applying pressure) Signs of dehydration: Decreased urine output Sunken eyes Pinched skin that does not spring back Excessive thirst or dry mouth Light-headedness Seek emergency care.  Painful erythema, edema, or ulcers that make swallowing difficult White patches (or sticky white film) in the mouth Pain unrelieved by acetaminophen  or previously prescribed pain relievers Inability to eat soft foods Foul odor coming from mouth Worsening of symptoms Seek urgent  care within 24 hours.  Temperature higher than 100.4 F without suspected neutropenia Painful erythema, edema, ulcers, or white patches but still able to eat and swallow Painless ulcers, white patches, erythema, or mild soreness without lesions Notify healthcare provider and continue homecare instructions.   Patient Instruction  Disclaimer:  Patient specific and [employer] instruction provided verbally and sent through MyChart for active users.    Inspect mouth daily and call healthcare provider if changes occur, including new sores, swelling, bleed-ing, pain, or white patches (sticky white film).  Monitor temperature daily and call healthcare provider for fever (temperature higher than 100.57F [38C]) without suspected neutropenia. Seek emergency care/call an ambulance immediately for fever (temperature higher than 100.57F [38C]) with suspected neutropenia.  Practice good oral hygiene.   Use a soft toothbrush. Brush teeth at least twice a day using a pea-sized amount (or smaller) of Biotne Toothpaste. Biotne toothpaste is recommended; however, flavored toothpaste may make brushing more palatable (bubble gum flavors are the easiest, non-irritating flavors to find). Avoid any toothpaste without an American Dental Association (ADA) seal. * Avoid any toothpaste with an ADA seal that contains pyrophosphate,hexametaphosphate, cinnamon flavoring, strong mint flavoring, or sodium lauryl sulfate. Avoid any toothpaste with an ADA seal that is labeled as whitening, brightening, or tartar control.  Good technique is critical when brushing.  Brush for two minutes.  Use a gentle rotating/circular motion.  Hold the toothbrush at a 45 angle to the tooth surface. Rinse the brush  well using warm water . Allow the toothbrush to air dry between uses.  Do not cover or cap toothbrush. Change toothbrush at least every three months.  Floss daily with waxed floss if platelet count is greater than  50,000/mm3 is greater than 1,000/mm3 and white blood cell count is greater than 1,000/mm; avoid any sore or bleeding areas of the gums.   For patients who wear dentures, use a denture brush or toothbrush and regular toothpaste at least once a day or after meals. Clean denture storage container at least once a week. Wear dentures only when eating foods that need dentures if they are irritating the oral mucosa. Avoid use of dentures if mouth sores are present under them. Do not use denture adhesives. Do not wear loose dentures.  Complete lip care at least twice a day with lanolin (Lansinoh or other lanolin, USP ointments.  Other options include Aloe Vesta skin protectant, RadiaBlock lip balm, Biotene lip moisturizer gel, Aquaphor, and Eucerin. Lanolin-based creams and ointments are more effective moisturizing and protecting against damage than petrolatum-based products.  Avoid use of ChapStick, occlusive lip balms, such as petrolatum, may promote microbial growth.  Use oral rinses (rinse and gargle for 15-30 seconds or as tolerated; do not swallow) at least four times a day, especially after meals, if erythema or bleeding is present. Oral rinse options include the following: Salt and baking soda (one-fourth teaspoon regular table salt mixed with one-half teaspoon baking soda in four ounces of water )  Salt water  (one teaspoon regular table salt mixed in four cups of water ) Baking soda rinse (one teaspoon baking soda mixed in eight ounces of water ) Plain water   Use of magic mouthwash (i.e., a mixture of viscous lidocaine , Mylanta, and diphenhydramine ) is ineffective and should be avoided.  Use of prophylactic dexamethasone  oral solution is encouraged to decrease the incidence and severity of oral aphthous stomatitis caused by mTOR inhibitors.  Avoid use of any mouthwashes that contain alcohol, including chlorhexidine with alcohol. Acceptable mouthwashes include Biotne dry mouth oral rinse,  Biotne PBF oral rinse, SmartMouthTM dry mouth oral rinse, and Oasis mois-turizing mouthwash.  For patients with dry, thick secretions, frequent oral rinses should be encouraged. Consider arranging portable suction to help remove secretions in the back of the throat as needed.  Consider use of a syringe with suction tubing to help facilitate oral rinses for patients with a sore mouth or for patients who have difficulty opening their mouth.  Continue oral fluid intake to maintain hydration and decrease viscosity of secretions. Add humidification as needed.  Eat high-protein, high-calorie meals often (e.g., six to eight small meals each day), if applicable.  Add extra calories and protein to food (e.g., add powdered milk to soups and casseroles).  Add extra fats such as butter, oil, and cream.  Choose soft, easy-to-chew food.   If mouth sores are present or the oral mucosa is sore, take acetaminophen  or another prescribed pain medication 30-60 minutes before eating.  Avoid the following foods and drinks: Foods with sharp edges  Hot foods  Very spicy, sour, or acidic foods and drinks  Sugary foods and drinks Foods that will stick to teeth Alcohol Avoid smoking and using chewing tobacco  Report the Following Problems:  Pain not relieved by medications  Bleeding gums  Temperature higher than 100.74F (38C) without suspected neutropenia  Foul odor coming from the mouth  New mouth sores  New swelling in the mouth  New white patches  Difficulty eating  Worsening symptoms  Seek  Emergency Care Immediately if Any of the Following Occurs: Severe ulceration and inability to eat or swallow  Uncontrolled bleeding  Difficulty breathing  Signs of dehydration  Temperature higher than 100.21F (38C) or chills with suspected neutropenia  Teach Back Method used:  Yes      Provider Consulted  Provider name and credentials: Dr. Jacobo and [facility] providers  Provider instruction: [facility]      Nurse Triage Priority:  Urgent (obtain medical orders as indicated with instruction for 8-24 hour or sooner follow-up)  Barriers to Care:  None identified  Nurse Triage Disposition:  In-person follow-up visit:  [facility]. Pt contacted with [facility] apt. He would like to come tomorrow at 10 am for port lab/ smc. His daughter will bring him to the apts.  Protocol Source:  Ruthine HERO., & Eldonna CANDIE Pee.). (2019). Telephone triage for oncology nurses (3rd ed.). Oncology Nursing Society.   "

## 2025-01-19 NOTE — Telephone Encounter (Signed)
 Patient's phone call returned to follow-up on whether he was able to get transportation to apts. He said his daughter could most likely bring him around 10 am tomorrow. Pt will be added to port lab/ [facility] for 01/20/25.

## 2025-01-20 ENCOUNTER — Other Ambulatory Visit: Payer: Self-pay | Admitting: Oncology

## 2025-01-20 ENCOUNTER — Inpatient Hospital Stay: Payer: MEDICAID | Admitting: Hospice and Palliative Medicine

## 2025-01-20 ENCOUNTER — Inpatient Hospital Stay: Payer: MEDICAID

## 2025-01-23 ENCOUNTER — Inpatient Hospital Stay: Payer: MEDICAID

## 2025-01-23 ENCOUNTER — Inpatient Hospital Stay: Payer: MEDICAID | Admitting: Oncology

## 2025-01-24 ENCOUNTER — Telehealth: Payer: Self-pay | Admitting: Oncology

## 2025-01-24 ENCOUNTER — Inpatient Hospital Stay: Payer: MEDICAID

## 2025-01-24 ENCOUNTER — Other Ambulatory Visit: Payer: Self-pay | Admitting: Oncology

## 2025-01-24 DIAGNOSIS — C155 Malignant neoplasm of lower third of esophagus: Secondary | ICD-10-CM

## 2025-01-24 NOTE — Telephone Encounter (Signed)
 The lab appt that was scheduled will include a platelet count.  Will need MD to change IS for appt changes.  Thanks

## 2025-01-24 NOTE — Telephone Encounter (Signed)
 Pt called to confirm next appt date/time - LH

## 2025-01-24 NOTE — Telephone Encounter (Signed)
 Pt called and states he wont be able to come to his appts this week due to the weather and transportation. He states he can start on Monday.  He also states we were suppose to check his blood to see if his platelets are low. Please advise

## 2025-01-25 ENCOUNTER — Other Ambulatory Visit: Payer: Self-pay | Admitting: Oncology

## 2025-01-25 ENCOUNTER — Inpatient Hospital Stay: Payer: MEDICAID

## 2025-01-25 ENCOUNTER — Inpatient Hospital Stay: Payer: MEDICAID | Admitting: Oncology

## 2025-01-26 ENCOUNTER — Inpatient Hospital Stay: Payer: MEDICAID

## 2025-01-26 ENCOUNTER — Other Ambulatory Visit: Payer: Self-pay

## 2025-02-02 ENCOUNTER — Inpatient Hospital Stay: Payer: MEDICAID

## 2025-02-02 ENCOUNTER — Inpatient Hospital Stay: Payer: MEDICAID | Admitting: Oncology

## 2025-02-03 ENCOUNTER — Inpatient Hospital Stay: Payer: MEDICAID

## 2025-02-06 ENCOUNTER — Inpatient Hospital Stay: Payer: MEDICAID | Admitting: Nurse Practitioner

## 2025-02-06 ENCOUNTER — Inpatient Hospital Stay: Payer: MEDICAID

## 2025-02-06 ENCOUNTER — Inpatient Hospital Stay: Payer: MEDICAID | Admitting: Oncology

## 2025-02-07 ENCOUNTER — Inpatient Hospital Stay: Payer: MEDICAID

## 2025-02-08 ENCOUNTER — Inpatient Hospital Stay: Payer: MEDICAID

## 2025-02-13 ENCOUNTER — Inpatient Hospital Stay: Payer: MEDICAID

## 2025-02-13 ENCOUNTER — Inpatient Hospital Stay: Payer: MEDICAID | Admitting: Nurse Practitioner

## 2025-02-13 ENCOUNTER — Inpatient Hospital Stay: Payer: MEDICAID | Admitting: Oncology

## 2025-02-14 ENCOUNTER — Inpatient Hospital Stay: Payer: MEDICAID

## 2025-02-20 ENCOUNTER — Inpatient Hospital Stay: Payer: MEDICAID | Admitting: Oncology

## 2025-02-20 ENCOUNTER — Inpatient Hospital Stay: Payer: MEDICAID

## 2025-02-21 ENCOUNTER — Inpatient Hospital Stay: Payer: MEDICAID

## 2025-02-27 ENCOUNTER — Inpatient Hospital Stay: Payer: MEDICAID | Admitting: Oncology

## 2025-02-27 ENCOUNTER — Inpatient Hospital Stay: Payer: MEDICAID

## 2025-02-28 ENCOUNTER — Inpatient Hospital Stay: Payer: MEDICAID

## 2025-03-06 ENCOUNTER — Inpatient Hospital Stay: Payer: MEDICAID

## 2025-03-06 ENCOUNTER — Inpatient Hospital Stay: Payer: MEDICAID | Admitting: Oncology

## 2025-03-06 ENCOUNTER — Inpatient Hospital Stay: Payer: MEDICAID | Admitting: Nurse Practitioner

## 2025-03-07 ENCOUNTER — Inpatient Hospital Stay: Payer: MEDICAID

## 2025-03-13 ENCOUNTER — Inpatient Hospital Stay: Payer: MEDICAID

## 2025-03-13 ENCOUNTER — Inpatient Hospital Stay: Payer: MEDICAID | Admitting: Oncology

## 2025-03-14 ENCOUNTER — Inpatient Hospital Stay: Payer: MEDICAID

## 2025-03-20 ENCOUNTER — Inpatient Hospital Stay: Payer: MEDICAID

## 2025-03-20 ENCOUNTER — Inpatient Hospital Stay: Payer: MEDICAID | Admitting: Nurse Practitioner

## 2025-03-20 ENCOUNTER — Inpatient Hospital Stay: Payer: MEDICAID | Admitting: Oncology

## 2025-03-27 ENCOUNTER — Inpatient Hospital Stay: Payer: MEDICAID

## 2025-03-27 ENCOUNTER — Inpatient Hospital Stay: Payer: MEDICAID | Admitting: Nurse Practitioner

## 2025-04-17 ENCOUNTER — Inpatient Hospital Stay: Payer: MEDICAID

## 2025-04-17 ENCOUNTER — Inpatient Hospital Stay: Payer: MEDICAID | Admitting: Oncology

## 2025-04-24 ENCOUNTER — Inpatient Hospital Stay: Payer: MEDICAID | Admitting: Oncology

## 2025-04-24 ENCOUNTER — Inpatient Hospital Stay: Payer: MEDICAID
